# Patient Record
Sex: Female | Born: 1945 | Race: White | Hispanic: No | Marital: Married | Smoking: Never smoker
Health system: Southern US, Community
[De-identification: ages and names within clinical notes are randomized; demographics above are authoritative.]

## PROBLEM LIST (undated history)

## (undated) DIAGNOSIS — G4733 Obstructive sleep apnea (adult) (pediatric): Secondary | ICD-10-CM

## (undated) DIAGNOSIS — I10 Essential (primary) hypertension: Secondary | ICD-10-CM

## (undated) DIAGNOSIS — K5190 Ulcerative colitis, unspecified, without complications: Secondary | ICD-10-CM

## (undated) DIAGNOSIS — E611 Iron deficiency: Secondary | ICD-10-CM

## (undated) DIAGNOSIS — Z794 Long term (current) use of insulin: Secondary | ICD-10-CM

## (undated) DIAGNOSIS — G629 Polyneuropathy, unspecified: Secondary | ICD-10-CM

## (undated) DIAGNOSIS — Z7409 Other reduced mobility: Secondary | ICD-10-CM

## (undated) DIAGNOSIS — K7460 Unspecified cirrhosis of liver: Secondary | ICD-10-CM

## (undated) DIAGNOSIS — K3189 Other diseases of stomach and duodenum: Secondary | ICD-10-CM

## (undated) DIAGNOSIS — IMO0001 Reserved for inherently not codable concepts without codable children: Secondary | ICD-10-CM

## (undated) DIAGNOSIS — K766 Portal hypertension: Secondary | ICD-10-CM

## (undated) DIAGNOSIS — R112 Nausea with vomiting, unspecified: Secondary | ICD-10-CM

## (undated) DIAGNOSIS — F411 Generalized anxiety disorder: Secondary | ICD-10-CM

## (undated) DIAGNOSIS — K219 Gastro-esophageal reflux disease without esophagitis: Secondary | ICD-10-CM

## (undated) DIAGNOSIS — K589 Irritable bowel syndrome without diarrhea: Secondary | ICD-10-CM

## (undated) DIAGNOSIS — Z862 Personal history of diseases of the blood and blood-forming organs and certain disorders involving the immune mechanism: Secondary | ICD-10-CM

## (undated) DIAGNOSIS — Z8719 Personal history of other diseases of the digestive system: Secondary | ICD-10-CM

## (undated) DIAGNOSIS — T8859XA Other complications of anesthesia, initial encounter: Secondary | ICD-10-CM

## (undated) DIAGNOSIS — I480 Paroxysmal atrial fibrillation: Secondary | ICD-10-CM

## (undated) DIAGNOSIS — I499 Cardiac arrhythmia, unspecified: Secondary | ICD-10-CM

## (undated) DIAGNOSIS — E785 Hyperlipidemia, unspecified: Secondary | ICD-10-CM

## (undated) DIAGNOSIS — R040 Epistaxis: Secondary | ICD-10-CM

## (undated) DIAGNOSIS — K7581 Nonalcoholic steatohepatitis (NASH): Secondary | ICD-10-CM

## (undated) DIAGNOSIS — Z9889 Other specified postprocedural states: Secondary | ICD-10-CM

## (undated) DIAGNOSIS — G5601 Carpal tunnel syndrome, right upper limb: Secondary | ICD-10-CM

## (undated) DIAGNOSIS — E119 Type 2 diabetes mellitus without complications: Secondary | ICD-10-CM

## (undated) DIAGNOSIS — I5032 Chronic diastolic (congestive) heart failure: Secondary | ICD-10-CM

## (undated) DIAGNOSIS — M797 Fibromyalgia: Secondary | ICD-10-CM

## (undated) DIAGNOSIS — M1990 Unspecified osteoarthritis, unspecified site: Secondary | ICD-10-CM

## (undated) DIAGNOSIS — F419 Anxiety disorder, unspecified: Secondary | ICD-10-CM

## (undated) HISTORY — PX: BREAST SURGERY: SHX581

## (undated) HISTORY — DX: Other diseases of stomach and duodenum: K76.6

## (undated) HISTORY — DX: Anxiety disorder, unspecified: F41.9

## (undated) HISTORY — PX: KNEE ARTHROSCOPY: SHX127

## (undated) HISTORY — DX: Generalized anxiety disorder: F41.1

## (undated) HISTORY — PX: LEFT ATRIAL APPENDAGE OCCLUSION: SHX173A

## (undated) HISTORY — DX: Essential (primary) hypertension: I10

## (undated) HISTORY — DX: Morbid (severe) obesity due to excess calories: E66.01

## (undated) HISTORY — DX: Gastro-esophageal reflux disease without esophagitis: K21.9

## (undated) HISTORY — DX: Polyneuropathy, unspecified: G62.9

## (undated) HISTORY — DX: Epistaxis: R04.0

## (undated) HISTORY — DX: Obstructive sleep apnea (adult) (pediatric): G47.33

## (undated) HISTORY — DX: Other reduced mobility: Z74.09

## (undated) HISTORY — PX: CHOLECYSTECTOMY: SHX55

## (undated) HISTORY — DX: Fibromyalgia: M79.7

## (undated) HISTORY — PX: RECTOCELE REPAIR: SHX761

## (undated) HISTORY — DX: Hyperlipidemia, unspecified: E78.5

## (undated) HISTORY — DX: Nonalcoholic steatohepatitis (NASH): K75.81

## (undated) HISTORY — PX: ESOPHAGOGASTRODUODENOSCOPY: SHX1529

## (undated) HISTORY — DX: Paroxysmal atrial fibrillation: I48.0

## (undated) HISTORY — DX: Unspecified cirrhosis of liver: K74.60

## (undated) HISTORY — PX: BACK SURGERY: SHX140

## (undated) HISTORY — DX: Chronic diastolic (congestive) heart failure: I50.32

## (undated) HISTORY — DX: Irritable bowel syndrome, unspecified: K58.9

## (undated) HISTORY — DX: Iron deficiency: E61.1

## (undated) HISTORY — DX: Other diseases of stomach and duodenum: K31.89

---

## 1978-09-30 HISTORY — PX: ABDOMINAL HYSTERECTOMY: SHX81

## 1992-09-30 HISTORY — PX: BREAST REDUCTION SURGERY: SHX8

## 1994-09-30 HISTORY — PX: CARPAL TUNNEL RELEASE: SHX101

## 1998-10-26 ENCOUNTER — Encounter: Payer: Self-pay | Admitting: Neurosurgery

## 1998-11-09 ENCOUNTER — Encounter: Payer: Self-pay | Admitting: Neurosurgery

## 1998-11-09 ENCOUNTER — Ambulatory Visit (HOSPITAL_COMMUNITY): Admission: RE | Admit: 1998-11-09 | Discharge: 1998-11-09 | Payer: Self-pay | Admitting: Neurosurgery

## 1998-11-23 ENCOUNTER — Ambulatory Visit (HOSPITAL_COMMUNITY): Admission: RE | Admit: 1998-11-23 | Discharge: 1998-11-23 | Payer: Self-pay | Admitting: Neurosurgery

## 1998-11-23 ENCOUNTER — Encounter: Payer: Self-pay | Admitting: Neurosurgery

## 1999-02-27 ENCOUNTER — Encounter: Admission: RE | Admit: 1999-02-27 | Discharge: 1999-03-30 | Payer: Self-pay | Admitting: Orthopedic Surgery

## 1999-06-12 ENCOUNTER — Encounter: Payer: Self-pay | Admitting: Neurosurgery

## 1999-06-12 ENCOUNTER — Ambulatory Visit (HOSPITAL_COMMUNITY): Admission: RE | Admit: 1999-06-12 | Discharge: 1999-06-12 | Payer: Self-pay | Admitting: Neurosurgery

## 1999-09-05 ENCOUNTER — Encounter: Payer: Self-pay | Admitting: Neurosurgery

## 1999-09-07 ENCOUNTER — Inpatient Hospital Stay (HOSPITAL_COMMUNITY): Admission: RE | Admit: 1999-09-07 | Discharge: 1999-09-09 | Payer: Self-pay | Admitting: Neurosurgery

## 1999-09-07 ENCOUNTER — Encounter: Payer: Self-pay | Admitting: Neurosurgery

## 1999-09-07 HISTORY — PX: HEMILAMINOTOMY LUMBAR SPINE: SUR654

## 1999-10-10 ENCOUNTER — Encounter: Admission: RE | Admit: 1999-10-10 | Discharge: 2000-01-08 | Payer: Self-pay | Admitting: Neurosurgery

## 2000-01-16 ENCOUNTER — Encounter: Admission: RE | Admit: 2000-01-16 | Discharge: 2000-01-16 | Payer: Self-pay | Admitting: Neurosurgery

## 2000-06-16 ENCOUNTER — Encounter: Admission: RE | Admit: 2000-06-16 | Discharge: 2000-06-16 | Payer: Self-pay | Admitting: Orthopedic Surgery

## 2000-06-16 ENCOUNTER — Encounter: Payer: Self-pay | Admitting: Orthopedic Surgery

## 2000-09-30 HISTORY — PX: TARSAL TUNNEL RELEASE: SUR1099

## 2001-01-20 ENCOUNTER — Encounter (INDEPENDENT_AMBULATORY_CARE_PROVIDER_SITE_OTHER): Payer: Self-pay | Admitting: Internal Medicine

## 2001-01-20 ENCOUNTER — Ambulatory Visit (HOSPITAL_COMMUNITY): Admission: RE | Admit: 2001-01-20 | Discharge: 2001-01-20 | Payer: Self-pay | Admitting: Internal Medicine

## 2001-01-27 ENCOUNTER — Other Ambulatory Visit: Admission: RE | Admit: 2001-01-27 | Discharge: 2001-01-27 | Payer: Self-pay | Admitting: Obstetrics and Gynecology

## 2001-02-02 ENCOUNTER — Encounter: Payer: Self-pay | Admitting: Obstetrics and Gynecology

## 2001-02-02 ENCOUNTER — Ambulatory Visit (HOSPITAL_COMMUNITY): Admission: RE | Admit: 2001-02-02 | Discharge: 2001-02-02 | Payer: Self-pay | Admitting: Obstetrics and Gynecology

## 2001-02-10 ENCOUNTER — Inpatient Hospital Stay (HOSPITAL_COMMUNITY): Admission: RE | Admit: 2001-02-10 | Discharge: 2001-02-12 | Payer: Self-pay | Admitting: Obstetrics and Gynecology

## 2001-02-10 ENCOUNTER — Encounter (INDEPENDENT_AMBULATORY_CARE_PROVIDER_SITE_OTHER): Payer: Self-pay

## 2001-02-10 HISTORY — PX: LYSIS OF ADHESION: SHX5961

## 2001-02-10 HISTORY — PX: URETEROLYSIS: SUR1407

## 2001-02-10 HISTORY — PX: BILATERAL SALPINGOOPHORECTOMY: SHX1223

## 2001-04-29 ENCOUNTER — Encounter: Admission: RE | Admit: 2001-04-29 | Discharge: 2001-07-28 | Payer: Self-pay | Admitting: Sports Medicine

## 2002-03-22 ENCOUNTER — Encounter: Payer: Self-pay | Admitting: Obstetrics and Gynecology

## 2002-03-22 ENCOUNTER — Ambulatory Visit (HOSPITAL_COMMUNITY): Admission: RE | Admit: 2002-03-22 | Discharge: 2002-03-22 | Payer: Self-pay | Admitting: Obstetrics and Gynecology

## 2002-09-29 ENCOUNTER — Ambulatory Visit (HOSPITAL_COMMUNITY): Admission: RE | Admit: 2002-09-29 | Discharge: 2002-09-29 | Payer: Self-pay | Admitting: Internal Medicine

## 2003-03-21 ENCOUNTER — Encounter (INDEPENDENT_AMBULATORY_CARE_PROVIDER_SITE_OTHER): Payer: Self-pay | Admitting: *Deleted

## 2003-03-21 ENCOUNTER — Ambulatory Visit (HOSPITAL_BASED_OUTPATIENT_CLINIC_OR_DEPARTMENT_OTHER): Admission: RE | Admit: 2003-03-21 | Discharge: 2003-03-21 | Payer: Self-pay | Admitting: Orthopedic Surgery

## 2003-03-21 HISTORY — PX: TUMOR EXCISION: SHX421

## 2003-03-21 HISTORY — PX: CARPAL TUNNEL RELEASE: SHX101

## 2003-03-29 ENCOUNTER — Ambulatory Visit (HOSPITAL_COMMUNITY): Admission: RE | Admit: 2003-03-29 | Discharge: 2003-03-29 | Payer: Self-pay | Admitting: Obstetrics and Gynecology

## 2003-03-29 ENCOUNTER — Encounter: Payer: Self-pay | Admitting: Obstetrics and Gynecology

## 2003-05-27 ENCOUNTER — Encounter: Payer: Self-pay | Admitting: Neurosurgery

## 2003-05-27 ENCOUNTER — Ambulatory Visit (HOSPITAL_COMMUNITY): Admission: RE | Admit: 2003-05-27 | Discharge: 2003-05-27 | Payer: Self-pay | Admitting: Neurosurgery

## 2003-06-01 ENCOUNTER — Ambulatory Visit (HOSPITAL_COMMUNITY): Admission: RE | Admit: 2003-06-01 | Discharge: 2003-06-01 | Payer: Self-pay | Admitting: Internal Medicine

## 2003-06-01 ENCOUNTER — Encounter (INDEPENDENT_AMBULATORY_CARE_PROVIDER_SITE_OTHER): Payer: Self-pay | Admitting: Internal Medicine

## 2003-11-01 ENCOUNTER — Encounter
Admission: RE | Admit: 2003-11-01 | Discharge: 2004-01-30 | Payer: Self-pay | Admitting: Physical Medicine and Rehabilitation

## 2004-03-06 ENCOUNTER — Encounter
Admission: RE | Admit: 2004-03-06 | Discharge: 2004-06-04 | Payer: Self-pay | Admitting: Physical Medicine and Rehabilitation

## 2004-04-03 ENCOUNTER — Ambulatory Visit (HOSPITAL_COMMUNITY): Admission: RE | Admit: 2004-04-03 | Discharge: 2004-04-03 | Payer: Self-pay | Admitting: Obstetrics and Gynecology

## 2004-04-10 ENCOUNTER — Ambulatory Visit (HOSPITAL_COMMUNITY): Admission: RE | Admit: 2004-04-10 | Discharge: 2004-04-10 | Payer: Self-pay | Admitting: Internal Medicine

## 2004-06-26 ENCOUNTER — Encounter
Admission: RE | Admit: 2004-06-26 | Discharge: 2004-09-10 | Payer: Self-pay | Admitting: Physical Medicine and Rehabilitation

## 2004-06-27 ENCOUNTER — Ambulatory Visit: Payer: Self-pay | Admitting: Physical Medicine and Rehabilitation

## 2004-08-08 ENCOUNTER — Ambulatory Visit: Payer: Self-pay | Admitting: Family Medicine

## 2004-08-17 ENCOUNTER — Ambulatory Visit: Payer: Self-pay | Admitting: Physical Medicine and Rehabilitation

## 2004-09-06 ENCOUNTER — Ambulatory Visit: Payer: Self-pay | Admitting: Family Medicine

## 2004-09-10 ENCOUNTER — Encounter
Admission: RE | Admit: 2004-09-10 | Discharge: 2004-12-09 | Payer: Self-pay | Admitting: Physical Medicine and Rehabilitation

## 2004-10-17 ENCOUNTER — Ambulatory Visit: Payer: Self-pay | Admitting: Physical Medicine and Rehabilitation

## 2004-10-24 ENCOUNTER — Ambulatory Visit: Payer: Self-pay | Admitting: Family Medicine

## 2004-11-23 ENCOUNTER — Encounter
Admission: RE | Admit: 2004-11-23 | Discharge: 2005-01-23 | Payer: Self-pay | Admitting: Physical Medicine and Rehabilitation

## 2004-12-17 ENCOUNTER — Encounter
Admission: RE | Admit: 2004-12-17 | Discharge: 2005-03-17 | Payer: Self-pay | Admitting: Physical Medicine and Rehabilitation

## 2004-12-19 ENCOUNTER — Ambulatory Visit: Payer: Self-pay | Admitting: Physical Medicine and Rehabilitation

## 2005-01-30 ENCOUNTER — Ambulatory Visit: Payer: Self-pay | Admitting: Family Medicine

## 2005-02-22 ENCOUNTER — Ambulatory Visit: Payer: Self-pay | Admitting: Physical Medicine and Rehabilitation

## 2005-03-15 ENCOUNTER — Encounter
Admission: RE | Admit: 2005-03-15 | Discharge: 2005-04-04 | Payer: Self-pay | Admitting: Physical Medicine and Rehabilitation

## 2005-03-21 ENCOUNTER — Ambulatory Visit: Payer: Self-pay | Admitting: Internal Medicine

## 2005-04-01 ENCOUNTER — Ambulatory Visit: Payer: Self-pay | Admitting: Family Medicine

## 2005-05-01 ENCOUNTER — Ambulatory Visit (HOSPITAL_COMMUNITY): Admission: RE | Admit: 2005-05-01 | Discharge: 2005-05-01 | Payer: Self-pay | Admitting: Specialist

## 2005-05-22 ENCOUNTER — Ambulatory Visit: Payer: Self-pay | Admitting: Family Medicine

## 2005-06-21 ENCOUNTER — Ambulatory Visit: Payer: Self-pay | Admitting: Family Medicine

## 2005-07-19 ENCOUNTER — Ambulatory Visit: Payer: Self-pay | Admitting: Family Medicine

## 2005-07-23 ENCOUNTER — Ambulatory Visit: Payer: Self-pay | Admitting: Physical Medicine and Rehabilitation

## 2005-07-23 ENCOUNTER — Encounter
Admission: RE | Admit: 2005-07-23 | Discharge: 2005-10-21 | Payer: Self-pay | Admitting: Physical Medicine and Rehabilitation

## 2005-10-17 ENCOUNTER — Ambulatory Visit: Payer: Self-pay | Admitting: Family Medicine

## 2005-10-23 ENCOUNTER — Ambulatory Visit: Payer: Self-pay | Admitting: Family Medicine

## 2005-11-07 ENCOUNTER — Ambulatory Visit: Payer: Self-pay | Admitting: Family Medicine

## 2005-11-28 ENCOUNTER — Ambulatory Visit: Payer: Self-pay | Admitting: Internal Medicine

## 2005-12-02 ENCOUNTER — Ambulatory Visit (HOSPITAL_COMMUNITY): Admission: RE | Admit: 2005-12-02 | Discharge: 2005-12-02 | Payer: Self-pay | Admitting: Internal Medicine

## 2005-12-02 ENCOUNTER — Ambulatory Visit: Payer: Self-pay | Admitting: Internal Medicine

## 2005-12-02 HISTORY — PX: ESOPHAGOGASTRODUODENOSCOPY (EGD) WITH ESOPHAGEAL DILATION: SHX5812

## 2005-12-31 ENCOUNTER — Ambulatory Visit: Payer: Self-pay | Admitting: Family Medicine

## 2006-01-21 ENCOUNTER — Ambulatory Visit: Payer: Self-pay | Admitting: Family Medicine

## 2006-03-18 ENCOUNTER — Encounter
Admission: RE | Admit: 2006-03-18 | Discharge: 2006-06-16 | Payer: Self-pay | Admitting: Physical Medicine and Rehabilitation

## 2006-03-18 ENCOUNTER — Ambulatory Visit: Payer: Self-pay | Admitting: Physical Medicine and Rehabilitation

## 2006-04-03 ENCOUNTER — Ambulatory Visit: Payer: Self-pay | Admitting: Family Medicine

## 2006-05-06 ENCOUNTER — Ambulatory Visit (HOSPITAL_COMMUNITY): Admission: RE | Admit: 2006-05-06 | Discharge: 2006-05-06 | Payer: Self-pay | Admitting: Obstetrics & Gynecology

## 2006-07-10 ENCOUNTER — Ambulatory Visit: Payer: Self-pay | Admitting: Family Medicine

## 2006-07-26 ENCOUNTER — Ambulatory Visit: Payer: Self-pay | Admitting: Family Medicine

## 2006-09-12 ENCOUNTER — Ambulatory Visit (HOSPITAL_COMMUNITY): Admission: RE | Admit: 2006-09-12 | Discharge: 2006-09-13 | Payer: Self-pay | Admitting: Urology

## 2006-12-18 ENCOUNTER — Ambulatory Visit: Payer: Self-pay | Admitting: Family Medicine

## 2007-03-26 ENCOUNTER — Ambulatory Visit: Payer: Self-pay | Admitting: Family Medicine

## 2007-05-11 ENCOUNTER — Ambulatory Visit (HOSPITAL_COMMUNITY): Admission: RE | Admit: 2007-05-11 | Discharge: 2007-05-11 | Payer: Self-pay | Admitting: Obstetrics & Gynecology

## 2007-10-01 LAB — HM COLONOSCOPY: HM Colonoscopy: NORMAL

## 2008-06-02 ENCOUNTER — Ambulatory Visit (HOSPITAL_COMMUNITY): Admission: RE | Admit: 2008-06-02 | Discharge: 2008-06-02 | Payer: Self-pay | Admitting: Obstetrics & Gynecology

## 2009-04-30 LAB — CONVERTED CEMR LAB: Pap Smear: NORMAL

## 2009-06-21 ENCOUNTER — Ambulatory Visit (HOSPITAL_COMMUNITY): Admission: RE | Admit: 2009-06-21 | Discharge: 2009-06-21 | Payer: Self-pay | Admitting: Obstetrics & Gynecology

## 2009-06-21 ENCOUNTER — Encounter: Payer: Self-pay | Admitting: Obstetrics & Gynecology

## 2009-06-29 ENCOUNTER — Ambulatory Visit (HOSPITAL_COMMUNITY): Admission: RE | Admit: 2009-06-29 | Discharge: 2009-06-29 | Payer: Self-pay | Admitting: Obstetrics & Gynecology

## 2009-09-05 ENCOUNTER — Encounter: Payer: Self-pay | Admitting: Internal Medicine

## 2009-10-12 ENCOUNTER — Encounter: Payer: Self-pay | Admitting: Endocrinology

## 2009-12-19 ENCOUNTER — Encounter: Payer: Self-pay | Admitting: Internal Medicine

## 2009-12-19 ENCOUNTER — Encounter: Payer: Self-pay | Admitting: Endocrinology

## 2009-12-19 LAB — CONVERTED CEMR LAB
Alkaline Phosphatase: 51 units/L
BUN: 14 mg/dL
CO2: 31 meq/L
Creatinine, Ser: 0.82 mg/dL
Glucose, Bld: 284 mg/dL
HDL: 37 mg/dL
Hgb A1c MFr Bld: 7.7 %
Triglyceride fasting, serum: 413 mg/dL

## 2009-12-26 ENCOUNTER — Encounter: Payer: Self-pay | Admitting: Internal Medicine

## 2009-12-27 ENCOUNTER — Encounter: Payer: Self-pay | Admitting: Internal Medicine

## 2009-12-27 LAB — CONVERTED CEMR LAB
BUN: 12 mg/dL
Basophils Relative: 0 %
CO2: 26 meq/L
Calcium: 9.4 mg/dL
Chloride: 98 meq/L
Creatinine, Ser: 0.95 mg/dL
Glucose, Bld: 214 mg/dL
Monocytes Relative: 10 %
Platelets: 284 10*3/uL
Potassium: 4 meq/L
Sodium: 139 meq/L

## 2010-01-03 ENCOUNTER — Ambulatory Visit (HOSPITAL_COMMUNITY): Admission: RE | Admit: 2010-01-03 | Discharge: 2010-01-03 | Payer: Self-pay | Admitting: Obstetrics & Gynecology

## 2010-01-26 ENCOUNTER — Encounter: Payer: Self-pay | Admitting: Internal Medicine

## 2010-01-26 ENCOUNTER — Encounter: Payer: Self-pay | Admitting: Endocrinology

## 2010-02-07 ENCOUNTER — Encounter: Payer: Self-pay | Admitting: Internal Medicine

## 2010-02-28 ENCOUNTER — Encounter: Payer: Self-pay | Admitting: Internal Medicine

## 2010-02-28 ENCOUNTER — Encounter: Payer: Self-pay | Admitting: Endocrinology

## 2010-03-09 ENCOUNTER — Encounter: Payer: Self-pay | Admitting: Endocrinology

## 2010-03-09 ENCOUNTER — Encounter: Payer: Self-pay | Admitting: Internal Medicine

## 2010-03-23 ENCOUNTER — Encounter: Payer: Self-pay | Admitting: Endocrinology

## 2010-04-12 ENCOUNTER — Ambulatory Visit: Payer: Self-pay | Admitting: Endocrinology

## 2010-04-12 DIAGNOSIS — I10 Essential (primary) hypertension: Secondary | ICD-10-CM | POA: Insufficient documentation

## 2010-04-12 DIAGNOSIS — K219 Gastro-esophageal reflux disease without esophagitis: Secondary | ICD-10-CM

## 2010-04-12 DIAGNOSIS — K5190 Ulcerative colitis, unspecified, without complications: Secondary | ICD-10-CM

## 2010-04-12 DIAGNOSIS — N959 Unspecified menopausal and perimenopausal disorder: Secondary | ICD-10-CM | POA: Insufficient documentation

## 2010-05-29 ENCOUNTER — Ambulatory Visit: Payer: Self-pay | Admitting: Internal Medicine

## 2010-05-29 DIAGNOSIS — Z9889 Other specified postprocedural states: Secondary | ICD-10-CM

## 2010-05-29 DIAGNOSIS — Z9189 Other specified personal risk factors, not elsewhere classified: Secondary | ICD-10-CM | POA: Insufficient documentation

## 2010-05-29 DIAGNOSIS — R079 Chest pain, unspecified: Secondary | ICD-10-CM | POA: Insufficient documentation

## 2010-05-29 LAB — CONVERTED CEMR LAB
BUN: 14 mg/dL (ref 6–23)
Basophils Absolute: 0 10*3/uL (ref 0.0–0.1)
CO2: 31 meq/L (ref 19–32)
Calcium: 9.7 mg/dL (ref 8.4–10.5)
Creatinine,U: 114 mg/dL
Direct LDL: 119.2 mg/dL
Eosinophils Relative: 1.9 % (ref 0.0–5.0)
GFR calc non Af Amer: 69.03 mL/min (ref >60)
Hemoglobin: 14.2 g/dL (ref 12.0–15.0)
Hgb A1c MFr Bld: 7.3 % — ABNORMAL HIGH (ref 4.6–6.5)
Lymphocytes Relative: 49.7 % — ABNORMAL HIGH (ref 12.0–46.0)
Lymphs Abs: 3.1 10*3/uL (ref 0.7–4.0)
MCHC: 35.3 g/dL (ref 30.0–36.0)
MCV: 91.8 fL (ref 78.0–100.0)
Microalb Creat Ratio: 1 mg/g (ref 0.0–30.0)
Microalb, Ur: 1.1 mg/dL (ref 0.0–1.9)
Monocytes Absolute: 0.5 10*3/uL (ref 0.1–1.0)
RDW: 13.1 % (ref 11.5–14.6)
Sodium: 139 meq/L (ref 135–145)
Total CHOL/HDL Ratio: 5

## 2010-06-07 ENCOUNTER — Ambulatory Visit: Payer: Self-pay | Admitting: Internal Medicine

## 2010-06-07 DIAGNOSIS — M25519 Pain in unspecified shoulder: Secondary | ICD-10-CM | POA: Insufficient documentation

## 2010-06-11 ENCOUNTER — Encounter: Payer: Self-pay | Admitting: Internal Medicine

## 2010-06-18 ENCOUNTER — Ambulatory Visit: Payer: Self-pay | Admitting: Cardiology

## 2010-06-18 ENCOUNTER — Ambulatory Visit: Payer: Self-pay

## 2010-06-18 ENCOUNTER — Ambulatory Visit (HOSPITAL_COMMUNITY): Admission: RE | Admit: 2010-06-18 | Discharge: 2010-06-18 | Payer: Self-pay | Admitting: Internal Medicine

## 2010-06-18 ENCOUNTER — Encounter: Payer: Self-pay | Admitting: Internal Medicine

## 2010-07-12 ENCOUNTER — Ambulatory Visit: Payer: Self-pay | Admitting: Endocrinology

## 2010-07-12 ENCOUNTER — Ambulatory Visit: Payer: Self-pay | Admitting: Internal Medicine

## 2010-07-31 HISTORY — PX: CARDIOVASCULAR STRESS TEST: SHX262

## 2010-08-08 ENCOUNTER — Telehealth: Payer: Self-pay | Admitting: Internal Medicine

## 2010-08-08 DIAGNOSIS — R002 Palpitations: Secondary | ICD-10-CM | POA: Insufficient documentation

## 2010-08-10 ENCOUNTER — Ambulatory Visit: Payer: Self-pay | Admitting: Cardiology

## 2010-08-10 ENCOUNTER — Encounter: Payer: Self-pay | Admitting: Cardiology

## 2010-08-10 ENCOUNTER — Telehealth (INDEPENDENT_AMBULATORY_CARE_PROVIDER_SITE_OTHER): Payer: Self-pay | Admitting: *Deleted

## 2010-08-15 ENCOUNTER — Telehealth (INDEPENDENT_AMBULATORY_CARE_PROVIDER_SITE_OTHER): Payer: Self-pay | Admitting: *Deleted

## 2010-08-16 ENCOUNTER — Encounter: Payer: Self-pay | Admitting: Cardiology

## 2010-08-16 ENCOUNTER — Encounter: Payer: Self-pay | Admitting: Cardiovascular Disease

## 2010-08-16 ENCOUNTER — Ambulatory Visit: Payer: Self-pay

## 2010-08-16 ENCOUNTER — Ambulatory Visit: Payer: Self-pay | Admitting: Cardiology

## 2010-08-16 ENCOUNTER — Encounter (HOSPITAL_COMMUNITY)
Admission: RE | Admit: 2010-08-16 | Discharge: 2010-09-29 | Payer: Self-pay | Source: Home / Self Care | Attending: Cardiology | Admitting: Cardiology

## 2010-08-22 ENCOUNTER — Telehealth: Payer: Self-pay | Admitting: Cardiology

## 2010-08-29 ENCOUNTER — Ambulatory Visit (HOSPITAL_COMMUNITY): Admission: RE | Admit: 2010-08-29 | Discharge: 2010-08-29 | Payer: Self-pay | Admitting: Obstetrics & Gynecology

## 2010-09-25 ENCOUNTER — Telehealth: Payer: Self-pay | Admitting: Internal Medicine

## 2010-09-26 ENCOUNTER — Ambulatory Visit
Admission: RE | Admit: 2010-09-26 | Discharge: 2010-09-26 | Payer: Self-pay | Source: Home / Self Care | Attending: Internal Medicine | Admitting: Internal Medicine

## 2010-09-26 DIAGNOSIS — J209 Acute bronchitis, unspecified: Secondary | ICD-10-CM | POA: Insufficient documentation

## 2010-09-27 ENCOUNTER — Encounter: Payer: Self-pay | Admitting: Cardiology

## 2010-09-27 ENCOUNTER — Ambulatory Visit: Payer: Self-pay | Admitting: Cardiology

## 2010-09-28 ENCOUNTER — Telehealth: Payer: Self-pay | Admitting: Internal Medicine

## 2010-10-11 ENCOUNTER — Other Ambulatory Visit: Payer: Self-pay | Admitting: Endocrinology

## 2010-10-11 ENCOUNTER — Ambulatory Visit
Admission: RE | Admit: 2010-10-11 | Discharge: 2010-10-11 | Payer: Self-pay | Source: Home / Self Care | Attending: Internal Medicine | Admitting: Internal Medicine

## 2010-10-11 ENCOUNTER — Ambulatory Visit
Admission: RE | Admit: 2010-10-11 | Discharge: 2010-10-11 | Payer: Self-pay | Source: Home / Self Care | Attending: Endocrinology | Admitting: Endocrinology

## 2010-10-11 DIAGNOSIS — R5381 Other malaise: Secondary | ICD-10-CM | POA: Insufficient documentation

## 2010-10-11 DIAGNOSIS — R5383 Other fatigue: Secondary | ICD-10-CM

## 2010-10-11 LAB — HEMOGLOBIN A1C: Hgb A1c MFr Bld: 7.8 % — ABNORMAL HIGH (ref 4.6–6.5)

## 2010-10-11 LAB — CBC WITH DIFFERENTIAL/PLATELET
Basophils Absolute: 0 10*3/uL (ref 0.0–0.1)
Basophils Relative: 0.4 % (ref 0.0–3.0)
Eosinophils Absolute: 0.1 10*3/uL (ref 0.0–0.7)
Eosinophils Relative: 0.6 % (ref 0.0–5.0)
HCT: 43.4 % (ref 36.0–46.0)
Hemoglobin: 15.1 g/dL — ABNORMAL HIGH (ref 12.0–15.0)
Lymphocytes Relative: 46.3 % — ABNORMAL HIGH (ref 12.0–46.0)
Lymphs Abs: 4.1 10*3/uL — ABNORMAL HIGH (ref 0.7–4.0)
MCHC: 34.8 g/dL (ref 30.0–36.0)
MCV: 95.1 fl (ref 78.0–100.0)
Monocytes Absolute: 0.7 10*3/uL (ref 0.1–1.0)
Monocytes Relative: 7.5 % (ref 3.0–12.0)
Neutro Abs: 4 10*3/uL (ref 1.4–7.7)
Neutrophils Relative %: 45.2 % (ref 43.0–77.0)
Platelets: 234 10*3/uL (ref 150.0–400.0)
RBC: 4.57 Mil/uL (ref 3.87–5.11)
RDW: 12.9 % (ref 11.5–14.6)
WBC: 8.8 10*3/uL (ref 4.5–10.5)

## 2010-10-11 LAB — BASIC METABOLIC PANEL
BUN: 17 mg/dL (ref 6–23)
CO2: 26 mEq/L (ref 19–32)
Calcium: 8.8 mg/dL (ref 8.4–10.5)
Chloride: 95 mEq/L — ABNORMAL LOW (ref 96–112)
Creatinine, Ser: 0.7 mg/dL (ref 0.4–1.2)
GFR: 91.29 mL/min (ref >60.00)
Glucose, Bld: 132 mg/dL — ABNORMAL HIGH (ref 70–99)
Potassium: 3.8 mEq/L (ref 3.5–5.1)
Sodium: 139 mEq/L (ref 135–145)

## 2010-10-11 LAB — HEPATIC FUNCTION PANEL
ALT: 27 U/L (ref 0–35)
AST: 27 U/L (ref 0–37)
Albumin: 4 g/dL (ref 3.5–5.2)
Alkaline Phosphatase: 41 U/L (ref 39–117)
Bilirubin, Direct: 0.1 mg/dL (ref 0.0–0.3)
Total Bilirubin: 0.8 mg/dL (ref 0.3–1.2)
Total Protein: 7.5 g/dL (ref 6.0–8.3)

## 2010-10-11 LAB — TSH: TSH: 1.12 u[IU]/mL (ref 0.35–5.50)

## 2010-10-17 ENCOUNTER — Telehealth: Payer: Self-pay | Admitting: Endocrinology

## 2010-10-18 ENCOUNTER — Telehealth: Payer: Self-pay | Admitting: Endocrinology

## 2010-10-20 ENCOUNTER — Encounter: Payer: Self-pay | Admitting: Obstetrics & Gynecology

## 2010-10-21 ENCOUNTER — Encounter: Payer: Self-pay | Admitting: Obstetrics and Gynecology

## 2010-10-21 ENCOUNTER — Encounter (INDEPENDENT_AMBULATORY_CARE_PROVIDER_SITE_OTHER): Payer: Self-pay | Admitting: Internal Medicine

## 2010-10-21 ENCOUNTER — Encounter: Payer: Self-pay | Admitting: *Deleted

## 2010-10-22 ENCOUNTER — Telehealth: Payer: Self-pay | Admitting: Internal Medicine

## 2010-10-28 LAB — CONVERTED CEMR LAB
ALT: 23 units/L
Alkaline Phosphatase: 37 units/L
BUN: 11 mg/dL
Basophils Relative: 1 %
CO2: 29 meq/L
Calcium: 8.8 mg/dL
Calcium: 9.1 mg/dL
Calcium: 9.2 mg/dL
Cholesterol: 175 mg/dL
Creatinine, Ser: 0.85 mg/dL
Creatinine, Ser: 0.86 mg/dL
Eosinophils Relative: 6 %
HCT: 38.9 %
LDL Cholesterol: 80 mg/dL
Lymphocytes, automated: 37 %
Monocytes Relative: 9 %
Neutrophils Relative %: 49 %
Platelets: 244 10*3/uL
RBC: 4.19 M/uL
RDW: 14.2 %
Sodium: 138 meq/L
Sodium: 138 meq/L
Total Protein: 7.1 g/dL
WBC: 6.9 10*3/uL

## 2010-10-30 NOTE — Assessment & Plan Note (Signed)
Summary: new / medicare / cd   Vital Signs:  Patient profile:   65 year old female Height:      61 inches (154.94 cm) Weight:      191.0 pounds (86.82 kg) O2 Sat:      95 % on Room air Temp:     97.7 degrees F (36.50 degrees C) oral Pulse rate:   70 / minute BP sitting:   124 / 78  (left arm) Cuff size:   large  Vitals Entered By: Tomma Lightning RMA (July 12, 2010 10:43 AM)  O2 Flow:  Room air CC: 4 month follow-up Is Patient Diabetic? Yes Did you bring your meter with you today? No Pain Assessment Patient in pain? no      Comments flu shot   Primary Care Herminia Warren:  Rowe Clack MD  CC:  4 month follow-up.  History of Present Illness: here for f/u:  1) DM2 - follows with endo for same - dx 2010 - no known chronic complications.  she has never been on insulin.  she was unable to tolerate metformin (diarrhea), januvia (headache), byetta (abdominal bruising), and actos (edema).  she now takes onglyza, metformin and bromocrip; off glipizide. states cbg's are well-controlled. pt says her diet and exercise are good.  2) HTN - reports compliance with ongoing medical treatment and no changes in medication dose or frequency. denies adverse side effects related to current therapy. would prefer to have generics for cost control if possible - chronic LE edema  3) GERD - reports compliance with ongoing medical treatment and no changes in medication dose or frequency. denies adverse side effects related to current therapy.  planning to try baking soda with omeprazole for cost savings in place of zegrid  4) Ulcerative colitis -quiet symptoms at this time - follwed with GI for same - reports compliance with ongoing medical treatment and no changes in medication dose or frequency. denies adverse side effects related to current therapy.   5) CP - cont ongoing occ "fluttering", usually at night when still 2 episodes since completion of stress test 06/19/10 (normal) always occurs at rest  - nonexertional, not positional 1st spell lasted minutes then 2nd episode lasted nearly 1 hour - no pain in past 10days - no pain now a/w palp and racing symptoms dizzy and light head - but no syncope denies radiation pain or n/v -   6) R shoulder pain - only mild and improved since onset early 05/2010 (precipitated by fall) improved sleep with use of robaxin - ?ok to cont same as needed   Clinical Review Panels:  Lipid Management   Cholesterol:  200 (05/29/2010)   LDL (bad choesterol):  80 (01/26/2010)   HDL (good cholesterol):  41.60 (05/29/2010)   Triglycerides:  196 (01/26/2010)  Diabetes Management   HgBA1C:  7.3 (05/29/2010)   Creatinine:  0.9 (05/29/2010)   Last Flu Vaccine:  Fluvax 3+ (07/12/2010)  CBC   WBC:  6.3 (05/29/2010)   RBC:  4.39 (05/29/2010)   Hgb:  14.2 (05/29/2010)   Hct:  40.3 (05/29/2010)   Platelets:  198.0 (05/29/2010)   MCV  91.8 (05/29/2010)   MCHC  35.3 (05/29/2010)   RDW  13.1 (05/29/2010)   PMN:  39.6 (05/29/2010)   Lymphs:  49.7 (05/29/2010)   Monos:  8.4 (05/29/2010)   Eosinophils:  1.9 (05/29/2010)   Basophil:  0.4 (05/29/2010)  Complete Metabolic Panel   Glucose:  153 (05/29/2010)   Sodium:  139 (05/29/2010)  Potassium:  4.9 (05/29/2010)   Chloride:  96 (05/29/2010)   CO2:  31 (05/29/2010)   BUN:  14 (05/29/2010)   Creatinine:  0.9 (05/29/2010)   Albumin:  3.9 (03/09/2010)   Total Protein:  7.1 (03/09/2010)   Calcium:  9.7 (05/29/2010)   Total Bili:  0.5 (03/09/2010)   Alk Phos:  37 (03/09/2010)   SGPT (ALT):  23 (03/09/2010)   SGOT (AST):  29 (03/09/2010)   Current Medications (verified): 1)  Bystolic 10 Mg Tabs (Nebivolol Hcl) .Marland Kitchen.. 1 Tablet Daily With 5 Mg Tab 2)  Bystolic 5 Mg Tabs (Nebivolol Hcl) .Marland Kitchen.. 1 Once Daily With 31m Tab 3)  Onglyza 5 Mg Tabs (Saxagliptin Hcl) ..Marland Kitchen. 1 Tablet Daily 4)  Hydrochlorothiazide 25 Mg Tabs (Hydrochlorothiazide) ..Marland Kitchen. 1 Tablet Daily 5)  Estradiol 1 Mg Tabs (Estradiol) ..Marland Kitchen. 1 Tablet  Daily 6)  Fish Oil 1000 Mg Caps (Omega-3 Fatty Acids) ..Marland Kitchen. 1 Capsule Once Daily 7)  Multivitamins  Caps (Multiple Vitamin) ..Marland Kitchen. 1 Capsule Daily 8)  Pentasa 500 Mg Cr-Caps (Mesalamine) ..Marland Kitchen. 1 Tab By Mouth Two Times A Day 9)  Bromocriptine Mesylate 2.5 Mg Tabs (Bromocriptine Mesylate) ..Marland Kitchen. 1 Tab At Bedtime 10)  Vitamin D 1000 Unit Tabs (Cholecalciferol) .... Take 1 By Mouth Once Daily 11)  Chromium Picolinate 200 Mcg Tabs (Chromium Picolinate) .... Take 1 Po Once Daily 12)  Alprazolam 0.25 Mg Tabs (Alprazolam) .... Take 1 Three Times A Day As Needed 13)  Zegerid 40-1100 Mg Caps (Omeprazole-Sodium Bicarbonate) ..Marland Kitchen. 1 By Mouth Once Daily 14)  Metformin Hcl 500 Mg Xr24h-Tab (Metformin Hcl) .... 2 Tabs Each Am  Allergies (verified): 1)  ! Novocain 2)  ! Sular (Nisoldipine) 3)  ! Percocet  Past History:  Past Medical History: diabetes mellitus type 2 ulcerative colitis  hypertension GERD palpitations - norm stress echo 05/2010  Md roster: [facility]o - [facility]  Review of Systems  The patient denies fever, weight loss, syncope, dyspnea on exertion, and peripheral edema.    Physical Exam  General:  alert, well-developed, well-nourished, and cooperative to examination.  overweight-appearing. spouse at side  Lungs:  normal respiratory effort, no intercostal retractions or use of accessory muscles; normal breath sounds bilaterally - no crackles and no wheezes.    Heart:  normal rate, regular rhythm, no murmur, and no rub. BLE with trace edema. Msk:  right shoulder: Full range of motion with good strength testing rotator cuff. Negative impingement signs. Nontender to palpation. Neurovascularly intact    Impression & Recommendations:  Problem # 1:  HYPERTENSION (ICD-401.9)  stress echo 05/2010 - no ischemic changes - change bystolic to generic toprol xl - potenital risks and benefits discussed with pt including poss SE - pt agrees recheck 3 mo - sooner if probelms - also consider  ARB or ACEI given DM (unless poor tol of med in prev trial)  The following medications were removed from the medication list:    Bystolic 10 Mg Tabs (Nebivolol hcl) ..Marland Kitchen.. 1 tablet daily with 5 mg tab Her updated medication list for this problem includes:    Metoprolol Succinate 100 Mg Xr24h-tab (Metoprolol succinate) ..Marland Kitchen.. 1 by mouth once daily    Hydrochlorothiazide 25 Mg Tabs (Hydrochlorothiazide) ..Marland Kitchen.. 1 tablet daily  BP today: 124/78 Prior BP: 132/76 (06/07/2010)  Labs Reviewed: K+: 4.9 (05/29/2010) Creat: : 0.9 (05/29/2010)   Chol: 200 (05/29/2010)   HDL: 41.60 (05/29/2010)   LDL: 80 (01/26/2010)   TG: 325.0 (05/29/2010)  Orders: Prescription Created Electronically [phone]  Problem #  2:  ULCERATIVE COLITIS (ICD-556.9)  follows with GI for same - cont current med mgmt - symptoms stable  Problem # 3:  DIABETES MELLITUS, TYPE II (ICD-250.00)  Her updated medication list for this problem includes:    Onglyza 5 Mg Tabs (Saxagliptin hcl) .Marland Kitchen... 1 tablet daily    Metformin Hcl 500 Mg Xr24h-tab (Metformin hcl) .Marland Kitchen... 2 tabs each am  following with endo for same  Labs Reviewed: Creat: 0.9 (05/29/2010)    Reviewed HgBA1c results: 7.3 (05/29/2010)  7.3 (03/09/2010)  Problem # 4:  GERD (ICD-530.81)  Her updated medication list for this problem includes:    Zegerid 40-1100 Mg Caps (Omeprazole-sodium bicarbonate) .Marland Kitchen... 1 by mouth once daily  Labs Reviewed: Hgb: 14.2 (05/29/2010)   Hct: 40.3 (05/29/2010)  Problem # 5:  SHOULDER PAIN, RIGHT (ICD-719.41)  Her updated medication list for this problem includes:    Methocarbamol 500 Mg Tabs (Methocarbamol) .Marland Kitchen... 1 by mouth three times a day as needed for muscle spasm  suspect RTC strain due to injury 05/2010- xray w/o bony injury ok to cont muscle relaxants as needed - erx done  Complete Medication List: 1)  Metoprolol Succinate 100 Mg Xr24h-tab (Metoprolol succinate) .Marland Kitchen.. 1 by mouth once daily 2)  Onglyza 5 Mg Tabs (Saxagliptin  hcl) .Marland Kitchen.. 1 tablet daily 3)  Hydrochlorothiazide 25 Mg Tabs (Hydrochlorothiazide) .Marland Kitchen.. 1 tablet daily 4)  Estradiol 1 Mg Tabs (Estradiol) .Marland Kitchen.. 1 tablet daily 5)  Fish Oil 1000 Mg Caps (Omega-3 fatty acids) .Marland Kitchen.. 1 capsule once daily 6)  Multivitamins Caps (Multiple vitamin) .Marland Kitchen.. 1 capsule daily 7)  Pentasa 500 Mg Cr-caps (Mesalamine) .Marland Kitchen.. 1 tab by mouth two times a day 8)  Bromocriptine Mesylate 2.5 Mg Tabs (Bromocriptine mesylate) .Marland Kitchen.. 1 tab at bedtime 9)  Vitamin D 1000 Unit Tabs (Cholecalciferol) .... Take 1 by mouth once daily 10)  Chromium Picolinate 200 Mcg Tabs (Chromium picolinate) .... Take 1 po once daily 11)  Alprazolam 0.25 Mg Tabs (Alprazolam) .... Take 1 three times a day as needed 12)  Zegerid 40-1100 Mg Caps (Omeprazole-sodium bicarbonate) .Marland Kitchen.. 1 by mouth once daily 13)  Metformin Hcl 500 Mg Xr24h-tab (Metformin hcl) .... 2 tabs each am 14)  Methocarbamol 500 Mg Tabs (Methocarbamol) .Marland Kitchen.. 1 by mouth three times a day as needed for muscle spasm  Other Orders: Flu Vaccine 1yr + MEDICARE PATIENTS (([id number] Administration Flu vaccine - MCR (([id number] Flu Vaccine Consent Questions     Do you have a history of severe allergic reactions to this vaccine? no    Any prior history of allergic reactions to egg and/or gelatin? no    Do you have a sensitivity to the preservative Thimersol? no    Do you have a past history of Guillan-Barre Syndrome? no    Do you currently have an acute febrile illness? no    Have you ever had a severe reaction to latex? no    Vaccine information given and explained to patient? yes    Are you currently pregnant? no    Lot Number:[id number]   Exp Date:03/30/2011   Site Given  Left Deltoid IMion Flu vaccine - MCR (([id number]  Patient Instructions: 1)  it was good to see you today. 2)  continue robaxin for muscle spasm and pain as needed  3)  stop bystolic and start generic Toprol XL for blood pressure - 4)   your prescriptions have been electronically submitted  to your pharmacy. Please take as directed. Contact our office if you believe you're having  problems with the medication(s). 5)  Please schedule a follow-up appointment in 3 months to recheck bloos pressure, call sooner if problems.  Prescriptions: METOPROLOL SUCCINATE 100 MG XR24H-TAB (METOPROLOL SUCCINATE) 1 by mouth once daily  #30 x 3   Entered and Authorized by:   Rowe Clack MD   Signed by:   Rowe Clack MD on 07/12/2010   Method used:   Electronically to        [facility] [phone]* (retail)       [address]       [place], [place]  [address]       Ph: [phone] or [phone]       Fax: [phone]   RxID:   [id number] METHOCARBAMOL 500 MG TABS (METHOCARBAMOL) 1 by mouth three times a day as needed for muscle spasm  #60 x 3   Entered and Authorized by:   Rowe Clack MD   Signed by:   Rowe Clack MD on 07/12/2010   Method used:   Electronically to        [facility] [phone]* (retail)       [place], [place]  [address]       Ph: [phone] or [phone]       Fax: [phone]   RxID:   [id number]    .lbmedflu

## 2010-10-30 NOTE — Assessment & Plan Note (Signed)
Summary: NP6/PALPS   Referring Quavis Klutz:  nyland Primary Rosebud Koenen:  Rowe Clack MD  CC:  chest pains and sob and dizziness.Marland Kitchen  History of Present Illness: 65 year old female for evaluation of palpitations. Stress echocardiogram performed in September of 2011 was interpreted as normal. There was mention that LV function may not have improved as much as expected and if clinical suspicion high consider alternative imaging. Patient presents complaining of chest pain. This has been intermittent for years. It is in the substernal area and is described as a pressure. It gets worse after eating and bending over. It can last one to 2 days at a time. She does not have exertional chest pain. She also notices recent palpitations. She has sudden onset of her heart " pounding". There is no syncope. This can last anywhere from 15 minutes to 2 hours. Because of the above we were asked to further evaluate.  Current Medications (verified): 1)  Metoprolol Succinate 100 Mg Xr24h-Tab (Metoprolol Succinate) .Marland Kitchen.. 1 By Mouth Once Daily 2)  Onglyza 5 Mg Tabs (Saxagliptin Hcl) .Marland Kitchen.. 1 Tablet Daily 3)  Hydrochlorothiazide 25 Mg Tabs (Hydrochlorothiazide) .Marland Kitchen.. 1 Tablet Daily 4)  Estradiol 1 Mg Tabs (Estradiol) .Marland Kitchen.. 1 Tablet Daily 5)  Fish Oil 1000 Mg Caps (Omega-3 Fatty Acids) .Marland Kitchen.. 1 Capsule Once Daily 6)  Multivitamins  Caps (Multiple Vitamin) .Marland Kitchen.. 1 Capsule Daily 7)  Pentasa 500 Mg Cr-Caps (Mesalamine) .Marland Kitchen.. 1 Tab By Mouth Two Times A Day 8)  Bromocriptine Mesylate 2.5 Mg Tabs (Bromocriptine Mesylate) .Marland Kitchen.. 1 Tab At Bedtime 9)  Vitamin D 1000 Unit Tabs (Cholecalciferol) .... Take 1 By Mouth Once Daily 10)  Chromium Picolinate 200 Mcg Tabs (Chromium Picolinate) .... Take 1 Po Once Daily 11)  Alprazolam 0.25 Mg Tabs (Alprazolam) .... Take 1 Three Times A Day As Needed 12)  Zegerid 40-1100 Mg Caps (Omeprazole-Sodium Bicarbonate) .Marland Kitchen.. 1 By Mouth Once Daily 13)  Metformin Hcl 500 Mg Xr24h-Tab (Metformin Hcl) .... 2 Tabs  Each Am  Allergies: 1)  ! Novocain 2)  ! Sular (Nisoldipine) 3)  ! Percocet  Past History:  Past Medical History: HYPERTENSION MENOPAUSAL DISORDER  GERD  DIABETES MELLITUS, TYPE II  ULCERATIVE COLITIS    Md roster: endo - [facility] -[facility]  Past Surgical History: 1. In 1980, total abdominal hysterectomy with bladder tack. 2. In 1994, bilateral breast reduction due to fibrocystic breast changes. 3. In 1990, rectocele repair. 4. In 1996, right carpal tunnel release. 5. In May 2000, right knee arthroscopy repair. 6. In December 2000, L4-L5 diskectomy,. 7. In February 2002, right knee arthroscopic repair. 8. Cholecystectomy  Family History: Reviewed history from 05/29/2010 and no changes required. Family History of Alcoholism/Addiction Family History of Arthritis Family History High cholesterol Family History Hypertension  dm:  mother  Father with MI mid 53's  Social History: Reviewed history from 05/29/2010 and no changes required. 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 Remaining systems are negative.   Vital Signs:  Patient profile:   65 year old female Height:      61 inches Weight:      191 pounds BMI:     36.22 Pulse rate:   86 / minute Resp:     14  per minute BP sitting:   130 / 80  (left arm)  Vitals Entered By: Burnett Kanaris (August 10, 2010 11:45 AM)  Physical Exam  General:  Well developed/well nourished in NAD Skin warm/dry Patient not depressed No peripheral clubbing Back-normal HEENT-normal/normal eyelids Neck supple/normal carotid upstroke bilaterally; no bruits; no JVD; no  thyromegaly chest - CTA/ normal expansion CV - RRR/normal S1 and S2; no murmurs, rubs or gallops;  PMI nondisplaced Abdomen -NT/ND, no HSM, no mass, + bowel sounds, no bruit 2+ femoral pulses, no bruits Ext-no edema, chords, 2+ DP Neuro-grossly nonfocal     EKG  Procedure date:  08/10/2010  Findings:      Sinus rhythm, prior anterior infarct.  Impression & Recommendations:  Problem # 1:  CHEST PAIN (ICD-786.50) Patient's symptoms are atypical. However she has multiple risk factors. Previous stress echocardiogram suboptimal with possibly less improvement in LV function than would be expected. Plan Myoview for more definitive evaluation. Her updated medication list for this problem includes:    Metoprolol Succinate 100 Mg Xr24h-tab (Metoprolol succinate) .Marland Kitchen... 1 by mouth once daily  Orders: Nuclear Stress Test (Nuc Stress Test)  Problem # 2:  PALPITATIONS, RECURRENT (ICD-785.1) Schedule CardioNet. Her updated medication list for this problem includes:    Metoprolol Succinate 100 Mg Xr24h-tab (Metoprolol succinate) .Marland Kitchen... 1 by mouth once daily  Orders: Event (Event)  Problem # 3:  HYPERTENSION (ICD-401.9) Blood pressure controlled on present medications. Will continue. Her updated medication list for this problem includes:    Metoprolol Succinate 100 Mg Xr24h-tab (Metoprolol succinate) .Marland Kitchen... 1 by mouth once daily    Hydrochlorothiazide 25 Mg Tabs (Hydrochlorothiazide) .Marland Kitchen... 1 tablet daily  Problem # 4:  DIABETES MELLITUS, TYPE II (ICD-250.00)  Her updated medication list for this problem includes:    Onglyza 5 Mg Tabs (Saxagliptin hcl) .Marland Kitchen... 1 tablet daily    Metformin Hcl 500 Mg Xr24h-tab (Metformin hcl) .Marland Kitchen... 2 tabs each am  Problem # 5:  GERD (ICD-530.81)  Her updated medication list for this problem includes:    Zegerid 40-1100 Mg Caps (Omeprazole-sodium bicarbonate) .Marland Kitchen... 1 by mouth once daily  Problem # 6:  ULCERATIVE COLITIS (ICD-556.9)  Patient  Instructions: 1)  Your physician recommends that you schedule a follow-up appointment in: 8 WEEKS 2)  Your physician has recommended that you wear an event monitor.  Event monitors are medical devices that record the heart's electrical activity. Doctors most often use these monitors to diagnose arrhythmias. Arrhythmias are problems with the speed or rhythm of the heartbeat. The monitor is a small, portable device. You can wear one while you do your normal daily activities. This is usually used to diagnose what is causing palpitations/syncope (passing out). 3)  Your physician has requested that you have an [place].  For further information please visit [email or web address].  Please follow instruction sheet, as given.

## 2010-10-30 NOTE — Assessment & Plan Note (Signed)
Summary: 3 mos f/u #/cd   Vital Signs:  Patient profile:   65 year old female Height:      61 inches (154.94 cm) Weight:      191 pounds (86.82 kg) BMI:     36.22 O2 Sat:      95 % on Room air Temp:     97.7 degrees F (36.50 degrees C) oral Pulse rate:   70 / minute BP sitting:   124 / 78  (left arm) Cuff size:   large  Vitals Entered By: Rebeca Alert MA (July 12, 2010 8:51 AM)  O2 Flow:  Room air CC: 3 month F/U/pt is no longer taking Dexilant, Osteo Bi Flex, Meloxicam or Methocarbamol/aj Is Patient Diabetic? Yes   Referring Kirsta Probert:  nyland Primary Leolia Vinzant:  Rowe Clack MD  CC:  3 month F/U/pt is no longer taking Dexilant, Osteo Bi Flex, and Meloxicam or Methocarbamol/aj.  History of Present Illness: pt states she feels well in general.  it varies from 137-300.  it is in general higher later in the day.  she takes metformin, onglyza, and parlodel.    Current Medications (verified): 1)  Bystolic 10 Mg Tabs (Nebivolol Hcl) .Marland Kitchen.. 1 Tablet Daily With 5 Mg Tab 2)  Bystolic 5 Mg Tabs (Nebivolol Hcl) .Marland Kitchen.. 1 Once Daily With 50m Tab 3)  Onglyza 5 Mg Tabs (Saxagliptin Hcl) ..Marland Kitchen. 1 Tablet Daily 4)  Hydrochlorothiazide 25 Mg Tabs (Hydrochlorothiazide) ..Marland Kitchen. 1 Tablet Daily 5)  Dexilant 60 Mg Cpdr (Dexlansoprazole) ..Marland Kitchen. 1 By Mouth Once Daily 6)  Estradiol 1 Mg Tabs (Estradiol) ..Marland Kitchen. 1 Tablet Daily 7)  Osteo Bi-Flex Joint Shield  Tabs (Misc Natural Products) ..Marland Kitchen. 1 Daily 8)  Fish Oil 1000 Mg Caps (Omega-3 Fatty Acids) ..Marland Kitchen. 1 Capsule Once Daily 9)  Multivitamins  Caps (Multiple Vitamin) ..Marland Kitchen. 1 Capsule Daily 10)  Pentasa 500 Mg Cr-Caps (Mesalamine) ..Marland Kitchen. 1 Tab By Mouth Two Times A Day 11)  Bromocriptine Mesylate 2.5 Mg Tabs (Bromocriptine Mesylate) .... 1/2 Tab At Bedtime 12)  Metformin Hcl 500 Mg Tabs (Metformin Hcl) .... Take 1 By Mouth Once Daily 13)  Vitamin D 1000 Unit Tabs (Cholecalciferol) .... Take 1 By Mouth Once Daily 14)  Chromium Picolinate 200 Mcg Tabs (Chromium  Picolinate) .... Take 1 Po Once Daily 15)  Alprazolam 0.25 Mg Tabs (Alprazolam) .... Take 1 Three Times A Day As Needed 16)  Meloxicam 15 Mg Tabs (Meloxicam) ..Marland Kitchen. 1 By Mouth Once Daily X 10days, Then As Needed For Pain 17)  Methocarbamol 500 Mg Tabs (Methocarbamol) ..Marland Kitchen. 1 By Mouth Three Times A Day As Needed For Muscle Spasm Pain 18)  Zegerid 40-1100 Mg Caps (Omeprazole-Sodium Bicarbonate) ..Marland Kitchen. 1 By Mouth Once Daily  Allergies (verified): 1)  ! Novocain 2)  ! Sular (Nisoldipine) 3)  ! Percocet  Past History:  Past Medical History: Last updated: 06/07/2010 diabetes mellitus type 2 ulcerative colitis  hypertension GERD  Md roster: [facility]o - [facility] -[facility]  Review of Systems  The patient denies hypoglycemia.    Physical Exam  General:  obese.  no distress  Extremities:  1+ right pedal edema and 1+ left pedal edema.   Additional Exam:  Hemoglobin A1C   7.7 %   Medications Added to Medication List This Visit: 1)  Bromocriptine Mesylate 2.5 Mg Tabs (Bromocriptine mesylate) ..Marland Kitchen. 1 tab at bedtime 2)  Zegerid 40-1100 Mg Caps (Omeprazole-sodium bicarbonate) ..Marland Kitchen. 1 by mouth once daily 3)  Metformin Hcl 500 Mg Xr24h-tab (Metformin hcl) .... 2 tabs  each am  Other Orders: TLB-A1C / Hgb A1C (Glycohemoglobin) (83036-A1C) Est. Patient Level III (74451)  Patient Instructions: 1)  check your blood sugar 1-2 times a day.  vary the time of day when you check, between before the 3 meals, and at bedtime.  also check if you have symptoms of your blood sugar being too high or too low.  please keep a record of the readings and bring it to your next appointment here.  please call us sooner if you are having low blood sugar episodes. 2)  cc suzanne miller. 3)  blood tests are being ordered for you today.  please call [phone] to hear your test results. 4)  pending the test results, please increase bromocriptine to 2.5 mg at bedtime 5)  Please schedule a follow-up appointment in 3 months. 6)   (update: i left message on phone-tree:  rx as we discussed) Prescriptions: BROMOCRIPTINE MESYLATE 2.5 MG TABS (BROMOCRIPTINE MESYLATE) 1 tab at bedtime  #30 x 11   Entered and Authorized by:   Donavan Foil MD   Signed by:   Donavan Foil MD on 07/12/2010   Method used:   Electronically to        [facility] [phone]* (retail)       [place], [place]  [address]       Ph: [phone] or [phone]       Fax: [phone]   RxID:   [id number] METFORMIN HCL 500 MG XR24H-TAB (METFORMIN HCL) 2 tabs each am  #60 x 11   Entered and Authorized by:   Donavan Foil MD   Signed by:   Donavan Foil MD on 07/12/2010   Method used:   Electronically to        [facility] [phone]* (retail)       [address]       [place], [place]  [address]       Ph: [phone] or [phone]       Fax: [phone]   RxID:   [id number]

## 2010-10-30 NOTE — Progress Notes (Signed)
Summary: re test results  Phone Note Call from Patient   Caller: Patient [phone] Reason for Call: Talk to Nurse, Lab or Test Results Summary of Call: pt calling re test results from last week Initial call taken by: Lorenda Hatchet,  August 22, 2010 9:04 AM  Follow-up for Phone Call        Pt. aware of results Mikle Bosworth RN  August 22, 2010 11:12 AM  Follow-up by: Whitney Jannett Celestine RN,  August 22, 2010 11:11 AM

## 2010-10-30 NOTE — Assessment & Plan Note (Signed)
Summary: PT FELL/ ARM PAIN/NWS #   Vital Signs:  Patient profile:   65 year old female Height:      61 inches (154.94 cm) Weight:      189.9 pounds (86.32 kg) O2 Sat:      98 % on Room air Temp:     98.3 degrees F (36.83 degrees C) oral Pulse rate:   67 / minute BP sitting:   132 / 76  (left arm) Cuff size:   large  Vitals Entered By: Tomma Lightning RMA (June 07, 2010 11:22 AM)  O2 Flow:  Room air CC: (R) arm & shoulder pain Is Patient Diabetic? Yes Did you bring your meter with you today? No Pain Assessment Patient in pain? yes     Location: (R) arm Type: aching Comments Pt states last night she had fell, hurt (R) arm. Been having shoulder and neck pain also   Primary Care Provider:  Rowe Clack MD  CC:  (R) arm & shoulder pain.  History of Present Illness: c/o R shoulder pain - onset pain <24 h ago (last night) precipitated by accidental fall against refridg when lost balance -  after impact against refrig, slid onto floor (hit right shoulder on ground) no popping or brusing - pain at site of impact and along top arc of shoulder cuff slept w/o sig problem last night - pain imporved after taking tylenol and ice to shoulder this AM, pain in shoulder with attempted overhead motion and abduction- chronically "low shoulder" on right compared to left side no HA, no syncope, no numbness no radiation of pain  prior OV reviewed: 1) DM2 - follows with endo for same - dx 2010 - no known chronic complications.  she has never been on insulin.  she was unable to tolerate metformin (diarrhea), januvia (headache), byetta (abdominal bruising), and actos (edema).  she now takes onglyza and glipizide (added 09/2009)  no cbg record, but states cbg's are well-controlled. pt says her diet and exercise are good.  2) HTN - reports compliance with ongoing medical treatment and no changes in medication dose or frequency. denies adverse side effects related to current therapy. would  prefer to have generics for cost control if possible - chronic LE edema  3) GERD - reports compliance with ongoing medical treatment and no changes in medication dose or frequency. denies adverse side effects related to current therapy.   4) Ulcerative colitis -quiet symptoms at this time - follwed with GI for same - reports compliance with ongoing medical treatment and no changes in medication dose or frequency. denies adverse side effects related to current therapy.    CP, no recurrence past 3 weeks - stress echo planned 06/18/10  Current Medications (verified): 1)  Bystolic 10 Mg Tabs (Nebivolol Hcl) .Marland Kitchen.. 1 Tablet Daily With 5 Mg Tab 2)  Bystolic 5 Mg Tabs (Nebivolol Hcl) .Marland Kitchen.. 1 Once Daily With 79m Tab 3)  Onglyza 5 Mg Tabs (Saxagliptin Hcl) ..Marland Kitchen. 1 Tablet Daily 4)  Hydrochlorothiazide 25 Mg Tabs (Hydrochlorothiazide) ..Marland Kitchen. 1 Tablet Daily 5)  Dexilant 60 Mg Cpdr (Dexlansoprazole) ..Marland Kitchen. 1 By Mouth Once Daily 6)  Estradiol 1 Mg Tabs (Estradiol) ..Marland Kitchen. 1 Tablet Daily 7)  Osteo Bi-Flex Joint Shield  Tabs (Misc Natural Products) ..Marland Kitchen. 1 Daily 8)  Fish Oil 1000 Mg Caps (Omega-3 Fatty Acids) ..Marland Kitchen. 1 Capsule Once Daily 9)  Multivitamins  Caps (Multiple Vitamin) ..Marland Kitchen. 1 Capsule Daily 10)  Pentasa 500 Mg Cr-Caps (Mesalamine) ..Marland Kitchen. 1 Tab By Mouth  Two Times A Day 11)  Bromocriptine Mesylate 2.5 Mg Tabs (Bromocriptine Mesylate) .... 1/2 Tab At Bedtime 12)  Metformin Hcl 500 Mg Tabs (Metformin Hcl) .... Take 1 By Mouth Once Daily 13)  Vitamin D 1000 Unit Tabs (Cholecalciferol) .... Take 1 By Mouth Once Daily 14)  Chromium Picolinate 200 Mcg Tabs (Chromium Picolinate) .... Take 1 Po Once Daily 15)  Alprazolam 0.25 Mg Tabs (Alprazolam) .... Take 1 Three Times A Day As Needed  Allergies (verified): 1)  ! Novocain 2)  ! Sular (Nisoldipine) 3)  ! Percocet  Past History:  Past Medical History: diabetes mellitus type 2 ulcerative colitis  hypertension GERD  Md roster: endo - [facility]  Review  of Systems  The patient denies vision loss, chest pain, syncope, headaches, and difficulty walking.    Physical Exam  General:  alert, well-developed, well-nourished, and cooperative to examination.  overweight-appearing. spouse at side  Msk:  right shoulder: Neurovascularly intact - decreased range of motion on forward flexion, abduction, and internal rotation. Positive impingement signs. Decreased strength due to pain with stressing of rotator cuff. referred pain into distal deltoid. Tender over a.c. joint and subacromial.  Skin:  no bruising over right shoulder/arm   Impression & Recommendations:  Problem # 1:  SHOULDER PAIN, RIGHT (ICD-719.41) suspect RTC strain based on hx and exam -  check xray r/o hum head fx - tx conservative antiinflam, muscle relaxants + ice - erx done if cont pain 2 weeks, refer PT or consider ortho if prolonged pain symptoms or worsening despite tx same explained to pt/spouse who understand and agree Her updated medication list for this problem includes:    Meloxicam 15 Mg Tabs (Meloxicam) .Marland Kitchen... 1 by mouth once daily x 10days, then as needed for pain    Methocarbamol 500 Mg Tabs (Methocarbamol) .Marland Kitchen... 1 by mouth three times a day as needed for muscle spasm pain  Orders: T-Shoulder Right ([id number]) Prescription Created Electronically [phone])  Complete Medication List: 1)  Bystolic 10 Mg Tabs (Nebivolol hcl) .Marland Kitchen.. 1 tablet daily with 5 mg tab 2)  Bystolic 5 Mg Tabs (Nebivolol hcl) .Marland Kitchen.. 1 once daily with 67m tab 3)  Onglyza 5 Mg Tabs (Saxagliptin hcl) ..Marland Kitchen. 1 tablet daily 4)  Hydrochlorothiazide 25 Mg Tabs (Hydrochlorothiazide) ..Marland Kitchen. 1 tablet daily 5)  Dexilant 60 Mg Cpdr (Dexlansoprazole) ..Marland Kitchen. 1 by mouth once daily 6)  Estradiol 1 Mg Tabs (Estradiol) ..Marland Kitchen. 1 tablet daily 7)  Osteo Bi-flex Joint Shield Tabs (Misc natural products) ..Marland Kitchen. 1 daily 8)  Fish Oil 1000 Mg Caps (Omega-3 fatty acids) ..Marland Kitchen. 1 capsule once daily 9)  Multivitamins Caps (Multiple vitamin)  ..Marland Kitchen. 1 capsule daily 10)  Pentasa 500 Mg Cr-caps (Mesalamine) ..Marland Kitchen. 1 tab by mouth two times a day 11)  Bromocriptine Mesylate 2.5 Mg Tabs (Bromocriptine mesylate) .... 1/2 tab at bedtime 12)  Metformin Hcl 500 Mg Tabs (Metformin hcl) .... Take 1 by mouth once daily 13)  Vitamin D 1000 Unit Tabs (Cholecalciferol) .... Take 1 by mouth once daily 14)  Chromium Picolinate 200 Mcg Tabs (Chromium picolinate) .... Take 1 po once daily 15)  Alprazolam 0.25 Mg Tabs (Alprazolam) .... Take 1 three times a day as needed 16)  Meloxicam 15 Mg Tabs (Meloxicam) ..Marland Kitchen. 1 by mouth once daily x 10days, then as needed for pain 17)  Methocarbamol 500 Mg Tabs (Methocarbamol) ..Marland Kitchen. 1 by mouth three times a day as needed for muscle spasm pain  Patient Instructions: 1)  it was good to see  you today. 2)  xray right shoulder today - your results will be called to you review in 24-48 hours from the time of test completion 3)  meloxicam for antiinflammatory and robaxin for muscle spasm pain - your prescriptions have been electronically submitted to your pharmacy. Please take as directed. Contact our office if you believe you're having problems with the medication(s). 4)  also ok to use tylenol as needed  5)  continue to ice shoulder 3x/d 20 minutes each time for next 72hours, then as needed  6)  activity as tolerated -  7)  if continued pain with movement in 10-14days, call and we will refer to physical therapy Prescriptions: METHOCARBAMOL 500 MG TABS (METHOCARBAMOL) 1 by mouth three times a day as needed for muscle spasm pain  #40 x 1   Entered and Authorized by:   Rowe Clack MD   Signed by:   Rowe Clack MD on 06/07/2010   Method used:   Electronically to        [facility] [phone]* (retail)       [place], [place]  [address]       Ph: [phone] or [phone]       Fax: [phone]   RxID:   [id number] MELOXICAM 15 MG TABS (MELOXICAM) 1 by  mouth once daily x 10days, then as needed for pain  #30 x 1   Entered and Authorized by:   Rowe Clack MD   Signed by:   Rowe Clack MD on 06/07/2010   Method used:   Electronically to        [facility] [phone]* (retail)       [address]       [place], [place]  [address]       Ph: [phone] or [phone]       Fax: [phone]   RxID:   [id number]

## 2010-10-30 NOTE — Assessment & Plan Note (Signed)
Summary: new endo/diabetes/nyland/medicare,bcbs/#/cd   Vital Signs:  Patient profile:   65 year old female Height:      61 inches (154.94 cm) Weight:      190.13 pounds (86.42 kg) BMI:     36.05 O2 Sat:      96 % on Room air Temp:     97.2 degrees F (36.22 degrees C) oral Pulse rate:   60 / minute BP sitting:   130 / 82  (left arm) Cuff size:   large  Vitals Entered By: Rebeca Alert MA (April 12, 2010 10:17 AM)  O2 Flow:  Room air CC: New Endo Pt/Diabetes/aj Is Patient Diabetic? Yes   Referring Provider:  nyland  CC:  New Endo Pt/Diabetes/aj.  History of Present Illness: pt states 1 year h/o dm.  she is unaware of any chronic complications.  she has never been on insulin.  she was unable to tolerate metformin (diarrhea), januvia (headache), byetta (abdominal bruising), and actos (edema).  she now takes onglyza and glipizide.  the glipizide was added approx 6 mos ago.  no cbg record, but states cbg's are well-controlled. pt says her diet and exercise are good. symptomatically, pt states 6 mos of mild leg cramps, and associated numbness.  Current Medications (verified): 1)  Bystolic 10 Mg Tabs (Nebivolol Hcl) .Marland Kitchen.. 1 Tablet Daily 2)  Onglyza 5 Mg Tabs (Saxagliptin Hcl) .Marland Kitchen.. 1 Tablet Daily 3)  Hydrochlorothiazide 25 Mg Tabs (Hydrochlorothiazide) .Marland Kitchen.. 1 Tablet Daily 4)  Zegerid 40-1100 Mg Caps (Omeprazole-Sodium Bicarbonate) .Marland Kitchen.. 1 Once Daily 5)  Estradiol 1 Mg Tabs (Estradiol) .Marland Kitchen.. 1 Tablet Daily 6)  Osteo Bi-Flex Joint Shield  Tabs (Misc Natural Products) .Marland Kitchen.. 1 Daily 7)  Fish Oil 1000 Mg Caps (Omega-3 Fatty Acids) .Marland Kitchen.. 1 Capsule Once Daily 8)  Multivitamins  Caps (Multiple Vitamin) .Marland Kitchen.. 1 Capsule Daily  Allergies (verified): 1)  ! Novocain 2)  ! Sular (Nisoldipine) 3)  ! * Percet  Family History: Family History of Alcoholism/Addiction Family History of Arthritis Family History of Colon CA 1st degree relative <60 Family History High cholesterol Family History  Hypertension  dm:  mother  Social History: Reviewed history and no changes required. married Occupation: Archivist Retired Never Smoked Alcohol use-no Occupation:  employed Smoking Status:  never  Review of Systems       The patient complains of chest pain and headaches.         denies weight loss, blurry vision, menopausal sxs, n/v, urinary frequency, memory loss, depression, hypoglycemia, and rhinorrhea.  she has easy bruising, doe and excessive diaphoresis.    Physical Exam  General:  obese.  no distress  Head:  head: no deformity eyes: no periorbital swelling, no proptosis external nose and ears are normal mouth: no lesion seen Neck:  Supple without thyroid enlargement or tenderness.  Chest Wall:  nontender Lungs:  Clear to auscultation bilaterally. Normal respiratory effort.  Heart:  Regular rate and rhythm without murmurs or gallops noted. Normal S1,S2.   Abdomen:  abdomen is soft, nontender.  no hepatosplenomegaly.   not distended.  no hernia  Msk:  muscle bulk and strength are grossly normal.  no obvious joint swelling.  gait is normal and steady  Pulses:  dorsalis pedis intact bilat.  no carotid bruit  Extremities:  no deformity.  no ulcer on the feet.  feet are of normal color and temp.   1+ right pedal edema and 1+ left pedal edema.   Neurologic:  cn 2-12 grossly intact.   readily  moves all 4's.   sensation is intact to touch on the feet  Skin:  normal texture and temp.  no rash.  not diaphoretic  Cervical Nodes:  No significant adenopathy.  Psych:  Alert and cooperative; normal mood and affect; normal attention span and concentration.   Additional Exam:  a1c=7.3 (june, 2011)   Impression & Recommendations:  Problem # 1:  DM (ICD-250.00) i agree with dr nyland that she should not lower the a1c to below 7.0 with this sulfonylurea-containing regimen.    Problem # 2:  edema this precludes actos rx  Problem # 3:  ULCERATIVE COLITIS  (ICD-556.9) this precludes rx with metformin.  Medications Added to Medication List This Visit: 1)  Bystolic 10 Mg Tabs (Nebivolol hcl) .Marland Kitchen.. 1 tablet daily 2)  Onglyza 5 Mg Tabs (Saxagliptin hcl) .Marland Kitchen.. 1 tablet daily 3)  Hydrochlorothiazide 25 Mg Tabs (Hydrochlorothiazide) .Marland Kitchen.. 1 tablet daily 4)  Zegerid 40-1100 Mg Caps (Omeprazole-sodium bicarbonate) .Marland Kitchen.. 1 once daily 5)  Estradiol 1 Mg Tabs (Estradiol) .Marland Kitchen.. 1 tablet daily 6)  Osteo Bi-flex Joint Shield Tabs (Misc natural products) .Marland Kitchen.. 1 daily 7)  Fish Oil 1000 Mg Caps (Omega-3 fatty acids) .Marland Kitchen.. 1 capsule once daily 8)  Multivitamins Caps (Multiple vitamin) .Marland Kitchen.. 1 capsule daily 9)  Pentasa 500 Mg Cr-caps (Mesalamine) .Marland Kitchen.. 1 tab three times a day 10)  Glipizide Xl 2.5 Mg Xr24h-tab (Glipizide) .Marland Kitchen.. 1 tab each am 11)  Bromocriptine Mesylate 2.5 Mg Tabs (Bromocriptine mesylate) .... 1/2 tab at bedtime 12)  Bystolic 5 Mg Tabs (Nebivolol hcl) .Marland Kitchen.. 1 once daily  Other Orders: New Patient Level IV (63016)  Patient Instructions: 1)  good diet and exercise habits significanly improve the control of your diabetes.  please let me know if you wish to be referred to a dietician.  high blood sugar is very risky to your health.  you should see an eye doctor every year. 2)  controlling your blood pressure and cholesterol drastically reduces the damage diabetes does to your body.  this also applies to quitting smoking.  please discuss these with your doctor.  you should take an aspirin every day, unless you have been advised by a doctor not to. 3)  check your blood sugar 1-2 times a day.  vary the time of day when you check, between before the 3 meals, and at bedtime.  also check if you have symptoms of your blood sugar being too high or too low.  please keep a record of the readings and bring it to your next appointment here.  please call us sooner if you are having low blood sugar episodes. 4)  cc suzanne miller. 5)  reduce glipizide-xr to 2.5 mg each  am 6)  add bromocriptine 1/2 of 2.5 mg at bedtime 7)  Please schedule a follow-up appointment in 3 months. Prescriptions: ONGLYZA 5 MG TABS (SAXAGLIPTIN HCL) 1 tablet daily  #30 x 11   Entered and Authorized by:   Donavan Foil MD   Signed by:   Donavan Foil MD on 04/12/2010   Method used:   Electronically to        [facility] [phone]* (retail)       [address]       [place], [place]  [address]       Ph: [phone] or [phone]       Fax: [phone]   RxID:   [id number] BROMOCRIPTINE MESYLATE 2.5  MG TABS (BROMOCRIPTINE MESYLATE) 1/2 tab at bedtime  #15 x 11   Entered and Authorized by:   Donavan Foil MD   Signed by:   Donavan Foil MD on 04/12/2010   Method used:   Electronically to        [facility] [phone]* (retail)       [address]       [place], [place]  [address]       Ph: [phone] or [phone]       Fax: [phone]   RxID:   [id number] GLIPIZIDE XL 2.5 MG XR24H-TAB (GLIPIZIDE) 1 tab each am  #30 x 11   Entered and Authorized by:   Donavan Foil MD   Signed by:   Donavan Foil MD on 04/12/2010   Method used:   Electronically to        [facility] [phone]* (retail)       [place], [place]  [address]       Ph: [phone] or [phone]       Fax: [phone]   RxID:   [id number]    Preventive Care Screening  Mammogram:    Date:  04/30/2009    Results:  normal   Pap Smear:    Date:  04/30/2009    Results:  normal   Last Tetanus Booster:    Date:  09/30/2008    Results:  Historical   Colonoscopy:    Date:  10/01/2007    Results:  normal     Immunization History:  Influenza Immunization History:    Influenza:  historical (06/30/2009)

## 2010-10-30 NOTE — Progress Notes (Signed)
Summary: Nuclear Pre-Procedure  Phone Note Outgoing Call   Call placed by: Perrin Maltese, EMT-P,  August 15, 2010 12:09 PM Summary of Call: Reviewed information on [facility] Information Sheet (see scanned document for further details).  Spoke with patient.     Nuclear Med Background Indications for Stress Test: Evaluation for Ischemia   History: Echo  History Comments: 09/11 STRESS ECHO NL   Symptoms: Chest Pain, Chest Pressure    Nuclear Pre-Procedure Cardiac Risk Factors: Family History - CAD, Hypertension, NIDDM Height (in): 70  Nuclear Med Study Referring MD:  B.Crenshaw

## 2010-10-30 NOTE — Assessment & Plan Note (Signed)
Summary: new / medicare / #cd   Vital Signs:  Patient profile:   65 year old female Height:      61 inches (154.94 cm) Weight:      188.8 pounds (85.82 kg) O2 Sat:      97 % on Room air Temp:     97.9 degrees F (36.61 degrees C) oral Pulse rate:   69 / minute BP sitting:   128 / 74  (left arm) Cuff size:   large  Vitals Entered By: Tomma Lightning RMA (May 29, 2010 9:50 AM)  O2 Flow:  Room air CC: New patient Is Patient Diabetic? Yes Did you bring your meter with you today? Yes Pain Assessment Patient in pain? no        Primary Care Provider:  Rowe Clack MD  CC:  New patient.  History of Present Illness: new pt to me but known to our endo specialist - here to est for PC needs - prev follwed with dr. Edrick Oh  1) DM2 - follows with endo for same - dx 2010 - no known chronic complications.  she has never been on insulin.  she was unable to tolerate metformin (diarrhea), januvia (headache), byetta (abdominal bruising), and actos (edema).  she now takes onglyza and glipizide.  the glipizide was added 09/2009.  no cbg record, but states cbg's are well-controlled. pt says her diet and exercise are good.  2) HTN - reports compliance with ongoing medical treatment and no changes in medication dose or frequency. denies adverse side effects related to current therapy. would prefer to have generics for cost control if possible - chronic LE edema  3) GERD - reports compliance with ongoing medical treatment and no changes in medication dose or frequency. denies adverse side effects related to current therapy.   4) Ulcerative colitis -quiet symptoms at this time - follwed with GI for same - reports compliance with ongoing medical treatment and no changes in medication dose or frequency. denies adverse side effects related to current therapy.   c/o CP -  2 episodes in past few weeks - onset at rest - nonexertional, not positional 1st spell lasted minutes then 2nd episode lasted  nearly 1 hour - no pain in past 10days - no pain now describes as SS chest "tightness"  a/w palp and racing symptoms dizzy and light head - but no syncope denies radiation pain or n/v -  stress test done years ago prior to knee surg -   Preventive Screening-Counseling & Management  Alcohol-Tobacco     Smoking Status: never  Caffeine-Diet-Exercise     Exercise Counseling: to improve exercise regimen     Depression Counseling: not indicated; screening negative for depression  Safety-Violence-Falls     Violence Counseling: not indicated; no violence risk noted     Fall Risk Counseling: not indicated; no significant falls noted  Clinical Review Panels:  Prevention   Last Mammogram:  Pt states she had spoton (L) breast. Had u/s done. will repeat every six months (05/31/2009)   Last Pap Smear:  normal (04/30/2009)   Last Colonoscopy:  normal (10/01/2007)  Immunizations   Last Tetanus Booster:  Historical (09/30/2008)   Last Flu Vaccine:  Historical (06/30/2009)   Current Medications (verified): 1)  Bystolic 10 Mg Tabs (Nebivolol Hcl) .Marland Kitchen.. 1 Tablet Daily 2)  Onglyza 5 Mg Tabs (Saxagliptin Hcl) .Marland Kitchen.. 1 Tablet Daily 3)  Hydrochlorothiazide 25 Mg Tabs (Hydrochlorothiazide) .Marland Kitchen.. 1 Tablet Daily 4)  Zegerid 40-1100 Mg Caps (Omeprazole-Sodium Bicarbonate) .Marland KitchenMarland KitchenMarland Kitchen  1 Once Daily 5)  Estradiol 1 Mg Tabs (Estradiol) .Marland Kitchen.. 1 Tablet Daily 6)  Osteo Bi-Flex Joint Shield  Tabs (Misc Natural Products) .Marland Kitchen.. 1 Daily 7)  Fish Oil 1000 Mg Caps (Omega-3 Fatty Acids) .Marland Kitchen.. 1 Capsule Once Daily 8)  Multivitamins  Caps (Multiple Vitamin) .Marland Kitchen.. 1 Capsule Daily 9)  Pentasa 500 Mg Cr-Caps (Mesalamine) .Marland Kitchen.. 1 Tab Three Times A Day 10)  Bromocriptine Mesylate 2.5 Mg Tabs (Bromocriptine Mesylate) .... 1/2 Tab At Bedtime 11)  Bystolic 5 Mg Tabs (Nebivolol Hcl) .Marland Kitchen.. 1 Once Daily 12)  Metformin Hcl 500 Mg Tabs (Metformin Hcl) .... Take 1 By Mouth Once Daily 13)  Vitamin D 1000 Unit Tabs (Cholecalciferol) .... Take 1 By  Mouth Once Daily 14)  Chromium Picolinate 200 Mcg Tabs (Chromium Picolinate) .... Take 1 Po Once Daily 15)  Alprazolam 0.25 Mg Tabs (Alprazolam) .... Take 1 Three Times A Day As Needed  Allergies: 1)  ! Novocain 2)  ! Sular (Nisoldipine) 3)  ! 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 I have reviewed all other systems and they were negative.   Physical Exam  General:  alert, well-developed, well-nourished, and cooperative to examination.  overweight-appearing.   Neck:  thick, supple, full ROM, no masses, no thyromegaly; no thyroid nodules or tenderness. no JVD or carotid bruits.    Lungs:  normal respiratory effort, no intercostal retractions or use of accessory muscles; normal breath sounds bilaterally - no crackles and no wheezes.    Heart:  normal rate, regular rhythm, no murmur, and no rub. BLE with trace edema. Abdomen:  soft, non-tender, normal bowel sounds, no distention; no masses and no appreciable hepatomegaly or splenomegaly.   Msk:  No deformity or scoliosis noted of thoracic or lumbar spine.   Neurologic:  alert & oriented X3 and cranial nerves II-XII symetrically intact.  strength normal in all extremities, sensation intact to light touch, and  gait normal. speech fluent without dysarthria or aphasia; follows commands with good comprehension.  Psych:  Oriented X3, memory intact for recent and remote, normally interactive, good eye contact, slightly anxious appearing, not depressed appearing, and not agitated.      Impression & Recommendations:  Problem # 1:  CHEST PAIN (ICD-786.50) given 2 episodes in past few weeks and multiple CRF - refer for stress test now Orders: TLB-Lipid Panel (80061-LIPID) Echo- Stress (Stress Echo)  Problem # 2:  DIABETES MELLITUS, TYPE II (ICD-250.00) following with endo for same send for labs and check a1c now The following medications were removed from the medication list:    Glipizide Xl 2.5 Mg Xr24h-tab (Glipizide) .Marland Kitchen... 1 tab each am Her updated medication list for this problem includes:    Onglyza 5 Mg Tabs (Saxagliptin hcl) .Marland Kitchen... 1 tablet daily    Metformin Hcl 500 Mg Tabs (Metformin hcl) .Marland Kitchen... Take  1 by mouth once daily  Orders: TLB-Lipid Panel (80061-LIPID) TLB-A1C / Hgb A1C (Glycohemoglobin) (83036-A1C) TLB-BMP (Basic Metabolic Panel-BMET) (33825-[id number]) TLB-Microalbumin/Creat Ratio, Urine (82043-MALB) Echo- Stress (Stress Echo)  Problem # 3:  HYPERTENSION (ICD-401.9) consider change from bystolic to generic after stress test complete - ?need Bbloc or try alt med note multiple drug intolerances in past Her updated medication list for this problem includes:    Bystolic 10 Mg Tabs (Nebivolol hcl) .Marland Kitchen... 1 tablet daily with 5 mg tab    Bystolic 5 Mg Tabs (Nebivolol hcl) .Marland Kitchen... 1 once daily with 67m tab    Hydrochlorothiazide 25 Mg Tabs (Hydrochlorothiazide) ..Marland Kitchen.. 1 tablet daily  Orders: TLB-Lipid Panel (80061-LIPID) TLB-BMP (Basic Metabolic Panel-BMET) ([id number]-[id number] Echo- Stress (Stress Echo)  BP today: 128/74 Prior BP: 130/82 (04/12/2010)  Problem # 4:  ULCERATIVE COLITIS (ICD-556.9) follows with GI for same - cont curretn med mgmt - symptoms stable  Problem # 5:  GERD  (ICD-530.81)  Her updated medication list for this problem includes:    Dexilant 60 Mg Cpdr (Dexlansoprazole) ..Marland Kitchen.. 1 by mouth once daily  Orders: TLB-CBC Platelet - w/Differential (85025-CBCD)  Complete Medication List: 1)  Bystolic 10 Mg Tabs (Nebivolol hcl) ..Marland Kitchen. 1 tablet daily with 5 mg tab 2)  Bystolic 5 Mg Tabs (Nebivolol hcl) ..Marland Kitchen. 1 once daily with 182mtab 3)  Onglyza 5 Mg Tabs (Saxagliptin hcl) ...Marland Kitchen 1 tablet daily 4)  Hydrochlorothiazide 25 Mg Tabs (Hydrochlorothiazide) ...Marland Kitchen 1 tablet daily 5)  Dexilant 60 Mg Cpdr (Dexlansoprazole) ...Marland Kitchen 1 by mouth once daily 6)  Estradiol 1 Mg Tabs (Estradiol) ...Marland Kitchen 1 tablet daily 7)  Osteo Bi-flex Joint Shield Tabs (Misc natural products) ...Marland Kitchen 1 daily 8)  Fish Oil 1000 Mg Caps (Omega-3 fatty acids) ...Marland Kitchen 1 capsule once daily 9)  Multivitamins Caps (Multiple vitamin) ...Marland Kitchen 1 capsule daily 10)  Pentasa 500 Mg Cr-caps (Mesalamine) ...Marland Kitchen 1 tab by mouth two times a day 11)  Bromocriptine Mesylate 2.5 Mg Tabs (Bromocriptine mesylate) .... 1/2 tab at bedtime 12)  Metformin Hcl 500 Mg Tabs (Metformin hcl) .... Take 1 by mouth once daily 13)  Vitamin D 1000 Unit Tabs (Cholecalciferol) .... Take 1 by mouth once daily 14)  Chromium Picolinate 200 Mcg Tabs (Chromium picolinate) .... Take 1 po once daily 15)  Alprazolam 0.25 Mg Tabs (Alprazolam) .... Take 1 three times a day as needed  Patient Instructions: 1)  it was good to see you today. 2)  medications and medications reviewed today - no changes recommended at this time - 3)  will send for records from dr. nyLeonarda Salono review 4)  test(s) ordered today - your results will be posted on the phone tree for review in 48-72 hours from the time of test completion; call [phone] enter your 9 digit MRN (listed above on this page, just below your name); if any changes need to be made or there are abnormal results, you will be contacted directly.  5)  we'll make referral for cardiac stress test. Our office will  contact you regarding this appointment once made.  6)  Please keep follow-up appointment in October as scheduled to review, sooner if problems.  Prescriptions: ALPRAZOLAM 0.25 MG TABS (ALPRAZOLAM) take 1 three times a day as needed  #60 x 2   Entered and Authorized by:   VaRowe ClackD   Signed by:   VaRowe ClackD on 05/29/2010   Method used:   Print then Give to Patient   RxID:   [id number]  Mammogram  Procedure date:  05/31/2009  Findings:      Pt states she had spoton (L) breast. Had u/s done. will repeat every six months

## 2010-10-30 NOTE — Letter (Signed)
Summary: [name] MD  [name] MD   Imported By: Bubba Hales 06/21/2010 07:32:42  _____________________________________________________________________  External Attachment:    Type:   Image     Comment:   External Document

## 2010-10-30 NOTE — Assessment & Plan Note (Signed)
Summary: Cardiology Nuclear Testing  Nuclear Med Background Indications for Stress Test: Evaluation for Ischemia   History: Echo  History Comments: 09/11 STRESS ECHO NL   Symptoms: Chest Pain, Chest Pressure, Palpitations    Nuclear Pre-Procedure Cardiac Risk Factors: Family History - CAD, Hypertension, NIDDM Caffeine/Decaff Intake: none NPO After: 7:30 AM Lungs: clear IV 0.9% NS with Angio Cath: 22g     IV Site: L Hand IV Started by: Matilde Haymaker, RN Chest Size (in) 36     Cup Size DD     Height (in): 61 Weight (lb): 185 BMI: 35.08 Tech Comments: Metoprolol held x 22hrs.  Nuclear Med Study 1 or 2 day study:  1 day     Stress Test Type:  Carlton Adam Reading MD:  Kirk Ruths, MD     Referring MD:  B.Crenshaw Resting Radionuclide:  Technetium 79mTetrofosmin     Resting Radionuclide Dose:  11 mCi  Stress Radionuclide:  Technetium 936metrofosmin     Stress Radionuclide Dose:  33 mCi   Stress Protocol  Max Systolic BP: 18352m Hg Lexiscan: 0.4 mg   Stress Test Technologist:  SaPerrin MalteseEMT-P     Nuclear Technologist:  ToAnnye RuskCNMT  Rest Procedure  Myocardial perfusion imaging was performed at rest 45 minutes following the intravenous administration of Technetium 997mtrofosmin.  Stress Procedure  The patient received IV Lexiscan 0.4 mg over 15-seconds.  Technetium 32m81mrofosmin injected at 30-seconds.  There were no significant changes, but freq pvcs/Vcupltes/V-Bigemeny with infusion.  Quantitative spect images were obtained after a 45 minute delay.  QPS Raw Data Images:  Acquisition technically good; normal left ventricular size. Stress Images:  Normal homogeneous uptake in all areas of the myocardium. Rest Images:  Normal homogeneous uptake in all areas of the myocardium. Subtraction (SDS):  No evidence of ischemia. Transient Ischemic Dilatation:  1.20  (Normal <1.22)  Lung/Heart Ratio:  .35  (Normal <0.45)  Quantitative Gated Spect Images QGS  cine images:  non-gated study   Overall Impression  Exercise Capacity: Lexiscan with no exercise. BP Response: Normal blood pressure response. Clinical Symptoms: No chest pain ECG Impression: No significant ST segment change suggestive of ischemia; frequent PVCs and occasional couplet. Overall Impression: Normal lexiscan nuclear study with no ischemia or infarction.

## 2010-10-30 NOTE — Progress Notes (Signed)
  Phone Note Call from Patient Call back at [facility] Phone [phone]   Caller: Patient Summary of Call: Pt called stating that episodes of radip heart beats have been occuring more frequently and lasting longer x 4-5 days. Pt states that she has also started experiencing twitching of her chin around the same time as these episode and is concerned. Please advise. Initial call taken by: Crissie Sickles, [place],  August 08, 2010 11:22 AM  Follow-up for Phone Call        will refer to cards for eval and tx as needed - pcc will arrange Follow-up by: Rowe Clack MD,  August 08, 2010 12:11 PM  Additional Follow-up for Phone Call Additional follow up Details #1::        Pt informed via VM per HIPAA Additional Follow-up by: Crissie Sickles, [place],  August 08, 2010 2:19 PM  New Problems: PALPITATIONS, RECURRENT (ICD-785.1)   New Problems: PALPITATIONS, RECURRENT (ICD-785.1)

## 2010-10-30 NOTE — Letter (Signed)
Summary: [name] MD  [name] MD   Imported By: Bubba Hales 06/21/2010 07:39:20  _____________________________________________________________________  External Attachment:    Type:   Image     Comment:   External Document

## 2010-10-30 NOTE — Letter (Signed)
Summary: [name] MD  [name] MD   Imported By: Bubba Hales 06/21/2010 07:33:45  _____________________________________________________________________  External Attachment:    Type:   Image     Comment:   External Document

## 2010-11-01 NOTE — Progress Notes (Signed)
Summary: URI?  Phone Note Call from Patient Call back at [facility] Phone [phone]   Caller: Patient Summary of Call: Pt called stating she is again sick, cough, congestion runny nose and mild ST and ear pain. Pt says she thinks she read that one of her medication can cause chronic URI and if sho she would like a substitute medication. Pt is also requesting an ABX, please advise. Initial call taken by: Crissie Sickles, [place],  October 22, 2010 8:59 AM  Follow-up for Phone Call        i would not recommend any change in rx meds due to URI symptoms at this time given community prevlence of URI infx during this season - ok for Zpak (erx done) - use claritin 41m once daily for runny nose and mucinex two times a day for cough symptoms  Follow-up by: VRowe ClackMD,  October 22, 2010 9:47 AM  Additional Follow-up for Phone Call Additional follow up Details #1::        Pt advised of above and understood Additional Follow-up by: DCrissie Sickles [place]  October 22, 2010 10:56 AM    New/Updated Medications: AZITHROMYCIN 250 MG TABS (AZITHROMYCIN) 2 tabs by mouth today, then 1 by mouth daily starting tomorrow CLARITIN 10 MG TABS (LORATADINE) 1 by mouth once daily as needed for runny nose symptoms MUCINEX 600 MG XR12H-TAB (GUAIFENESIN) 1 by mouth two times a day for cough symptoms Prescriptions: AZITHROMYCIN 250 MG TABS (AZITHROMYCIN) 2 tabs by mouth today, then 1 by mouth daily starting tomorrow  #6 x 0   Entered and Authorized by:   VRowe ClackMD   Signed by:   VRowe ClackMD on 10/22/2010   Method used:   Electronically to        [phone] (retail)       [address]      [place]  [address]      Ph: [phone]      Fax: [phone]  RxID:   [id number]

## 2010-11-01 NOTE — Procedures (Signed)
Summary: Patient Activity Report  Patient Activity Report   Imported By: Gemma Payor 09/14/2010 16:17:48  _____________________________________________________________________  External Attachment:    Type:   Image     Comment:   External Document

## 2010-11-01 NOTE — Progress Notes (Signed)
Summary: Event monitor  Phone Note Outgoing Call Call back at Home Phone [phone]   Call placed by: Susette Racer,  August 10, 2010 1:32 PM Summary of Call: call let messege for pt to call back for appt for monitor Initial call taken by: Susette Racer,  August 10, 2010 1:33 PM  Follow-up for Phone Call        Pt call back ,will have monitor same day of stress test Follow-up by: Susette Racer,  August 13, 2010 8:12 AM     Appended Document: Event monitor pt made aware monitor shows sinus with PVC's

## 2010-11-01 NOTE — Assessment & Plan Note (Signed)
Summary: f/u appt/cd   Vital Signs:  Patient profile:   65 year old female Height:      61 inches (154.94 cm) Weight:      183.8 pounds (83.55 kg) O2 Sat:      96 % on Room air Temp:     98.7 degrees F (37.06 degrees C) oral Pulse rate:   68 / minute BP sitting:   122 / 82  (left arm) Cuff size:   large  Vitals Entered By: Tomma Lightning RMA (October 11, 2010 11:27 AM)  O2 Flow:  Room air CC: follow-up visit Is Patient Diabetic? Yes Did you bring your meter with you today? No Pain Assessment Patient in pain? no      Comments Pt want to discuss meds states not sure which meds she is taking @ night is making her nausated. Also want to change Zegrid to something else too expensive. Requesting labswork also   Primary Care Provider:  Rowe Clack MD  CC:  follow-up visit.  History of Present Illness: here for f/u cont cough - tessalon ineffective, tussionex caused itch, otc meds ineffective no fever or sputum  c/o nausea each AM - ?med induced  feels fatigued: "something worng" - requests labs  reviewed chronic med issues:  1) DM2 - follows with endo for same - dx 2010 - no known chronic complications.  she has never been on insulin.  she was unable to tolerate metformin (diarrhea), januvia (headache), byetta (abdominal bruising), glipizide and actos (edema).  she now takes onglyza, metformin and bromocrip. states cbg's are well-controlled. pt says her diet and exercise are good.  2) HTN - reports compliance with ongoing medical treatment and no changes in medication dose or frequency. denies adverse side effects related to current therapy. would prefer to have generics for cost control if possible - chronic LE edema  3) GERD - reports compliance with ongoing medical treatment and no changes in medication dose or frequency. denies adverse side effects related to current therapy.  planning to try baking soda with omeprazole for cost savings in place of zegrid  4)  Ulcerative colitis -quiet symptoms at this time - follows with GI for same - reports compliance with ongoing medical treatment and no changes in medication dose or frequency. denies adverse side effects related to current therapy.   5) CP - ongoing occ "fluttering", usually at night when still 2 episodes since completion of stress test 06/19/10 (normal) always occurs at rest - nonexertional, not positional  6) R shoulder pain - only mild and improved since onset early 05/2010 (precipitated by fall) improved sleep with use of robaxin - ?ok to cont same as needed   Clinical Review Panels:  Immunizations   Last Tetanus Booster:  Historical (09/30/2008)   Last Flu Vaccine:  Fluvax 3+ (07/12/2010)  Lipid Management   Cholesterol:  200 (05/29/2010)   LDL (bad choesterol):  80 (01/26/2010)   HDL (good cholesterol):  41.60 (05/29/2010)   Triglycerides:  196 (01/26/2010)  Diabetes Management   HgBA1C:  7.7 (07/12/2010)   Creatinine:  0.9 (05/29/2010)   Last Flu Vaccine:  Fluvax 3+ (07/12/2010)  CBC   WBC:  6.3 (05/29/2010)   RBC:  4.39 (05/29/2010)   Hgb:  14.2 (05/29/2010)   Hct:  40.3 (05/29/2010)   Platelets:  198.0 (05/29/2010)   MCV  91.8 (05/29/2010)   MCHC  35.3 (05/29/2010)   RDW  13.1 (05/29/2010)   PMN:  39.6 (05/29/2010)   Lymphs:  49.7 (  05/29/2010)   Monos:  8.4 (05/29/2010)   Eosinophils:  1.9 (05/29/2010)   Basophil:  0.4 (05/29/2010)  Complete Metabolic Panel   Glucose:  153 (05/29/2010)   Sodium:  139 (05/29/2010)   Potassium:  4.9 (05/29/2010)   Chloride:  96 (05/29/2010)   CO2:  31 (05/29/2010)   BUN:  14 (05/29/2010)   Creatinine:  0.9 (05/29/2010)   Albumin:  3.9 (03/09/2010)   Total Protein:  7.1 (03/09/2010)   Calcium:  9.7 (05/29/2010)   Total Bili:  0.5 (03/09/2010)   Alk Phos:  37 (03/09/2010)   SGPT (ALT):  23 (03/09/2010)   SGOT (AST):  29 (03/09/2010)   Current Medications (verified): 1)  Metoprolol Succinate 100 Mg Xr24h-Tab (Metoprolol  Succinate) .Marland Kitchen.. 1 By Mouth Once Daily 2)  Onglyza 5 Mg Tabs (Saxagliptin Hcl) .Marland Kitchen.. 1 Tablet Daily 3)  Hydrochlorothiazide 25 Mg Tabs (Hydrochlorothiazide) .Marland Kitchen.. 1 Tablet Daily 4)  Estradiol 1 Mg Tabs (Estradiol) .Marland Kitchen.. 1 Tablet Daily 5)  Fish Oil 1000 Mg Caps (Omega-3 Fatty Acids) .Marland Kitchen.. 1 Capsule Once Daily 6)  Multivitamins  Caps (Multiple Vitamin) .Marland Kitchen.. 1 Capsule Daily 7)  Pentasa 500 Mg Cr-Caps (Mesalamine) .Marland Kitchen.. 1 Tab By Mouth Two Times A Day 8)  Bromocriptine Mesylate 2.5 Mg Tabs (Bromocriptine Mesylate) .Marland Kitchen.. 1 Tab At Bedtime 9)  Vitamin D 1000 Unit Tabs (Cholecalciferol) .... Take 1 By Mouth Once Daily 10)  Chromium Picolinate 200 Mcg Tabs (Chromium Picolinate) .... Take 1 Po Once Daily 11)  Alprazolam 0.25 Mg Tabs (Alprazolam) .... Take 1 Three Times A Day As Needed 12)  Zegerid 40-1100 Mg Caps (Omeprazole-Sodium Bicarbonate) .Marland Kitchen.. 1 By Mouth Once Daily 13)  Metformin Hcl 500 Mg Xr24h-Tab (Metformin Hcl) .... 2 Tabs Each Am  Allergies (verified): 1)  ! Novocain 2)  ! Sular (Nisoldipine) 3)  ! Percocet 4)  ! * Tussonex Cough Syrup  Past History:  Past Medical History: HYPERTENSION MENOPAUSAL DISORDER  GERD  DIABETES MELLITUS, TYPE II  ULCERATIVE COLITIS  Md roster:  [facility] - [facility] -[facility]  Review of Systems  The patient denies hoarseness, chest pain, abdominal pain, and severe indigestion/heartburn.    Physical Exam  General:  alert, well-developed, well-nourished, and cooperative to examination.  overweight-appearing. Eyes:  vision grossly intact; pupils equal, round and reactive to light.  conjunctiva and lids normal.    Mouth:  teeth and gums in good repair; mucous membranes moist, without lesions or ulcers. oropharynx clear without exudate, min erythema.  Neck:  thick, supple, full ROM, no masses, no thyromegaly; no thyroid nodules or tenderness. no JVD or carotid bruits.    Lungs:  normal respiratory effort, no intercostal retractions or use of accessory muscles; no  rhonchi, good air mvmt bilaterally - no crackles or wheeze.    Heart:  normal rate, regular rhythm, no murmur, and no rub. BLE with trace edema.   Impression & Recommendations:  Problem # 1:  ACUTE BRONCHITIS (ICD-466.0) Assessment Improved acute infx resolved but persisting dry cough intol and ineffective tx reviewed (tussionex, robitussin and tessalon) prev did well with tol tylenol #3 for pain - will try codiene for cough symptoms (and nausea control) Her updated medication list for this problem includes:    Promethazine-codeine 6.25-10 Mg/63m Syrp (Promethazine-codeine) ..Marland KitchenMarland KitchenMarland KitchenMarland Kitchen5 cc by mouth every 6 hours as needed for cough  Problem # 2:  FATIGUE (ICD-780.79) nonspecific exam and hx - check labs Orders: TLB-BMP (Basic Metabolic Panel-BMET) ([id number] TLB-CBC Platelet - w/Differential (85025-CBCD) TLB-Hepatic/Liver Function Pnl (80076-HEPATIC) TLB-TSH (Thyroid Stimulating  Hormone) (84443-TSH)  Problem # 3:  GERD (ICD-530.81)  ok to change zegrid to omeprazole for cost - erx done pt may take addl baking soda as needed  Her updated medication list for this problem includes:    Omeprazole 40 Mg Cpdr (Omeprazole) .Marland Kitchen... 1 by mouth once daily  Labs Reviewed: Hgb: 14.2 (05/29/2010)   Hct: 40.3 (05/29/2010)  Orders: Prescription Created Electronically [phone])  Complete Medication List: 1)  Metoprolol Succinate 100 Mg Xr24h-tab (Metoprolol succinate) .Marland Kitchen.. 1 by mouth once daily 2)  Onglyza 5 Mg Tabs (Saxagliptin hcl) .Marland Kitchen.. 1 tablet daily 3)  Hydrochlorothiazide 25 Mg Tabs (Hydrochlorothiazide) .Marland Kitchen.. 1 tablet daily 4)  Estradiol 1 Mg Tabs (Estradiol) .Marland Kitchen.. 1 tablet daily 5)  Fish Oil 1000 Mg Caps (Omega-3 fatty acids) .Marland Kitchen.. 1 capsule once daily 6)  Multivitamins Caps (Multiple vitamin) .Marland Kitchen.. 1 capsule daily 7)  Pentasa 500 Mg Cr-caps (Mesalamine) .Marland Kitchen.. 1 tab by mouth two times a day 8)  Bromocriptine Mesylate 2.5 Mg Tabs (Bromocriptine mesylate) .Marland Kitchen.. 1 tab at bedtime 9)  Vitamin D  1000 Unit Tabs (Cholecalciferol) .... Take 1 by mouth once daily 10)  Chromium Picolinate 200 Mcg Tabs (Chromium picolinate) .... Take 1 po once daily 11)  Alprazolam 0.25 Mg Tabs (Alprazolam) .... Take 1 three times a day as needed 12)  Omeprazole 40 Mg Cpdr (Omeprazole) .Marland Kitchen.. 1 by mouth once daily 13)  Metformin Hcl 500 Mg Xr24h-tab (Metformin hcl) .... 2 tabs each am 14)  Promethazine-codeine 6.25-10 Mg/39m Syrp (Promethazine-codeine) .... 5 cc by mouth every 6 hours as needed for cough  Patient Instructions: 1)  it was good to see you today. 2)  try codiene cough syrup 3)  change zegrid to generic omeprazole  4)  your prescriptions have been electronically submitted to your pharmacy. Please take as directed. Contact our office if you believe you're having problems with the medication(s).  5)  test(s) ordered today - your results will be posted on the phone tree for review in 48-72 hours from the time of test completion; call [phone] enter your 9 digit MRN (listed above on this page, just below your name); if any changes need to be made or there are abnormal results, you will be contacted directly.  Prescriptions: OMEPRAZOLE 40 MG CPDR (OMEPRAZOLE) 1 by mouth once daily  #90 x 1   Entered and Authorized by:   VRowe ClackMD   Signed by:   VRowe ClackMD on 10/11/2010   Method used:   Electronically to        [facility][phone] (retail)       [address] [place]  [address]      Ph: [phone] [phone]      Fax: [phone]  RxID:   [id number] MG/5ML SYRP (PROMETHAZINE-CODEINE) 5 cc by mouth every 6 hours as needed for cough  #100cc x 0   Entered and Authorized by:   VRowe ClackMD   Signed by:   VRowe ClackMD on 10/11/2010   Method used:   Printed then faxed to ...       [facility][phone] (retail)       [address]      [place]  [address]  Ph: [phone] or [phone]       Fax: [phone]   RxID:   [id number]    Orders Added: 1)  TLB-BMP (Basic Metabolic Panel-BMET) [[id number]] 2)  TLB-CBC Platelet - w/Differential [85025-CBCD] 3)  TLB-Hepatic/Liver Function Pnl [80076-HEPATIC] 4)  TLB-TSH (Thyroid Stimulating Hormone) [84443-TSH] 5)  Est. Patient Level IV [86854] 6)  Prescription Created Electronically [phone]

## 2010-11-01 NOTE — Progress Notes (Signed)
Summary: ALT med  Phone Note Call from Patient Call back at [facility] Phone [phone]   Caller: Patient Summary of Call: Pt called stating that cough syrup given at OV is causing itching. Pt is requesting alternative. Initial call taken by: Crissie Sickles, [place],  September 28, 2010 9:15 AM  Follow-up for Phone Call        use tessalon perles - a non-narcotic based alternitive - erx done - thanks Follow-up by: Rowe Clack MD,  September 28, 2010 9:32 AM  Additional Follow-up for Phone Call Additional follow up Details #1::        Pt advised Additional Follow-up by: Crissie Sickles, CMA,  September 28, 2010 11:10 AM    New/Updated Medications: TESSALON PERLES 100 MG CAPS (BENZONATATE) 1 by mouth three times a day x 72h, then as needed for cough symptoms Prescriptions: TESSALON PERLES 100 MG CAPS (BENZONATATE) 1 by mouth three times a day x 72h, then as needed for cough symptoms  #40 x 0   Entered and Authorized by:   Rowe Clack MD   Signed by:   Rowe Clack MD on 09/28/2010   Method used:   Electronically to        [facility] [phone] (retail)       [place], [place]  [address]       Ph: [phone] or [phone]       Fax: [phone]   RxID:   [id number]

## 2010-11-01 NOTE — Progress Notes (Signed)
Summary: cold sxs  Phone Note Call from Patient Call back at Home Phone [phone]   Caller: Patient Call For: Rowe Clack MD Reason for Call: Acute Illness, Talk to Doctor Summary of Call: Pt c/o chest cold. Has a cough with phlegm, sore throat, and is wheezing. Made appt for pt to see md tomorrow, but she want to know what md recommend to take otc since she is not able to come in today. Pls advise Initial call taken by: Tomma Lightning RMA,  September 25, 2010 8:51 AM  Follow-up for Phone Call        can use coricidian hbp cough and cold formula as per box directions - also use extra strength tylenol (531m) 1-2 every 6 hours for ache (like ST) - thanks Follow-up by: VRowe ClackMD,  September 25, 2010 9:05 AM  Additional Follow-up for Phone Call Additional follow up Details #1::        Notifed pt with md response. Additional Follow-up by: LTomma LightningRMA,  September 25, 2010 9:32 AM    New/Updated Medications: CORICIDIN HBP COUGH/COLD 4-30 MG TABS (CHLORPHENIRAMINE-DM) as directed (box label)

## 2010-11-01 NOTE — Progress Notes (Signed)
Summary: lab results  Phone Note Call from Patient   Caller: Patient [phone] Summary of Call: Pt called to check results of labs. Pt and I verified results are not on phone tree system, please advise on results. Initial call taken by: Crissie Sickles, [place],  October 17, 2010 8:21 AM  Follow-up for Phone Call        a1c=7.8.  good, but welchol is added to get down below 7.0. Follow-up by: Donavan Foil MD,  October 17, 2010 8:33 AM  Additional Follow-up for Phone Call Additional follow up Details #1::        Pt advised via VM, told to call back with any further questions or concerns Additional Follow-up by: Crissie Sickles, [place],  October 17, 2010 9:27 AM

## 2010-11-01 NOTE — Assessment & Plan Note (Signed)
Summary: [facility]   Referring Provider:  n[place] Primary Provider:  Rowe Clack MD   History of Present Illness: 65 year old female I saw in November of 2011 for evaluation of palpitations. Stress echocardiogram performed in September of 2011 was interpreted as normal. There was mention that LV function may not have improved as much as expected and if clinical suspicion high consider alternative imaging. Lexiscan Myoview in November of 2011 showed normal perfusion. Study not gated. CardioNet showed sinus with PVCs. Since she was seen previously the patient denies any dyspnea on exertion, orthopnea, PND, pedal edema,  syncope or chest pain. Her palpitations have improved. She occasionally feels a brief skipped but no sustained palpitations.    Current Medications (verified): 1)  Metoprolol Succinate 100 Mg Xr24h-Tab (Metoprolol Succinate) .Marland Kitchen.. 1 By Mouth Once Daily 2)  Onglyza 5 Mg Tabs (Saxagliptin Hcl) .Marland Kitchen.. 1 Tablet Daily 3)  Hydrochlorothiazide 25 Mg Tabs (Hydrochlorothiazide) .Marland Kitchen.. 1 Tablet Daily 4)  Estradiol 1 Mg Tabs (Estradiol) .Marland Kitchen.. 1 Tablet Daily 5)  Fish Oil 1000 Mg Caps (Omega-3 Fatty Acids) .Marland Kitchen.. 1 Capsule Once Daily 6)  Multivitamins  Caps (Multiple Vitamin) .Marland Kitchen.. 1 Capsule Daily 7)  Pentasa 500 Mg Cr-Caps (Mesalamine) .Marland Kitchen.. 1 Tab By Mouth Two Times A Day 8)  Bromocriptine Mesylate 2.5 Mg Tabs (Bromocriptine Mesylate) .Marland Kitchen.. 1 Tab At Bedtime 9)  Vitamin D 1000 Unit Tabs (Cholecalciferol) .... Take 1 By Mouth Once Daily 10)  Chromium Picolinate 200 Mcg Tabs (Chromium Picolinate) .... Take 1 Po Once Daily 11)  Alprazolam 0.25 Mg Tabs (Alprazolam) .... Take 1 Three Times A Day As Needed 12)  Zegerid 40-1100 Mg Caps (Omeprazole-Sodium Bicarbonate) .Marland Kitchen.. 1 By Mouth Once Daily 13)  Metformin Hcl 500 Mg Xr24h-Tab (Metformin Hcl) .... 2 Tabs Each Am 14)  Azithromycin 250 Mg Tabs (Azithromycin) .... 2 Tabs By Mouth Today, Then 1 By Mouth Daily Starting Tomorrow 15)  Tussionex  Pennkinetic Er 10-8 Mg/11m Lqcr (Hydrocod Polst-Chlorphen Polst) .... 5 Cc By Mouth Every 12h As Needed For Cough Symptoms  Allergies: 1)  ! Novocain 2)  ! Sular (Nisoldipine) 3)  ! Percocet  Past History:  Past Medical History: Reviewed history from 09/26/2010 and no changes required. HYPERTENSION MENOPAUSAL DISORDER  GERD  DIABETES MELLITUS, TYPE II  ULCERATIVE COLITIS  Md roster: endo - [facility] -[facility]  Past Surgical History: Reviewed history from 08/10/2010 and no changes required. 1. In 1980, total abdominal hysterectomy with bladder tack. 2. In 1994, bilateral breast reduction due to fibrocystic breast changes. 3. In 1990, rectocele repair. 4. In 1996, right carpal tunnel release. 5. In May 2000, right knee arthroscopy repair. 6. In December 2000, L4-L5 diskectomy,. 7. In February 2002, right knee arthroscopic repair. 8. Cholecystectomy  Social History: Reviewed history from 08/10/2010 and no changes required. 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 Remaining systems are negative.   Vital Signs:  Patient profile:   65year old female Height:      61 inches Weight:      184 pounds BMI:     34.89 Pulse rate:   77 / minute Resp:     14 per minute BP sitting:   139 / 80  (left arm)  Vitals Entered By: Burnett Kanaris (September 27, 2010 11:16 AM)  Physical Exam  General:  Well-developed well-nourished in no acute distress.  Skin is warm and dry.  HEENT is normal.  Neck is supple. No thyromegaly.  Chest is clear to auscultation with normal expansion.  Cardiovascular exam is regular rate and rhythm.    Abdominal exam nontender or distended. No masses palpated. Extremities show no edema. neuro grossly intact    Impression & Recommendations:  Problem # 1:  CHEST PAIN (ICD-786.50) No further chest pain. Myoview negative. No further cardiac evaluation. Her updated medication list for this problem includes:    Metoprolol Succinate 100 Mg Xr24h-tab (Metoprolol succinate) .Marland Kitchen... 1 by mouth once daily  Problem # 2:  PALPITATIONS, RECURRENT (ICD-785.1) Symptoms much improved. CardioNet showed PVCs. Continue beta blocker. Her updated medication list for this problem includes:    Metoprolol Succinate 100 Mg Xr24h-tab (Metoprolol succinate) .Marland Kitchen... 1 by mouth once daily  Problem # 3:  HYPERTENSION (ICD-401.9) Blood pressure controlled on present medications. Will continue. Her updated medication list for this problem includes:    Metoprolol Succinate 100 Mg Xr24h-tab (Metoprolol succinate) .Marland Kitchen... 1 by mouth once daily    Hydrochlorothiazide 25 Mg Tabs (Hydrochlorothiazide) .Marland Kitchen... 1 tablet daily  Problem # 4:  DIABETES MELLITUS, TYPE II (ICD-250.00)  Her updated medication list for this problem includes:    Onglyza 5 Mg Tabs (Saxagliptin hcl) .Marland Kitchen... 1 tablet daily    Metformin Hcl 500 Mg Xr24h-tab (Metformin hcl) .Marland Kitchen... 2 tabs each am  Problem # 5:  GERD (ICD-530.81)  Her updated medication list for this problem includes:    Zegerid 40-1100 Mg Caps (Omeprazole-sodium bicarbonate) .Marland Kitchen... 1 by mouth once daily

## 2010-11-01 NOTE — Assessment & Plan Note (Signed)
Summary: chest cold/ wheezing/lmb   Vital Signs:  Patient profile:   65 year old female Height:      61 inches (154.94 cm) Weight:      184.12 pounds (83.69 kg) O2 Sat:      95 % on Room air Temp:     98.3 degrees F (36.83 degrees C) oral Pulse rate:   80 / minute BP sitting:   128 / 80  (left arm) Cuff size:   large  Vitals Entered By: Tomma Lightning RMA (September 26, 2010 11:02 AM)  O2 Flow:  Room air CC: Chest cold/ wheezing Is Patient Diabetic? Yes Did you bring your meter with you today? No Pain Assessment Patient in pain? yes     Location: sore around rib area   Primary Care Provider:  Rowe Clack MD  CC:  Chest cold/ wheezing.  History of Present Illness: here for URI symptoms  onset 5 days ago started as throat tickles - quickly progressive to chest soreness and cough assoc with myalgias and productive cough (green sputum this AM) +LGF and chills, +wheeze no HA or sinus pressure, no nasal discharge - not relieved with any otc meds  reviewed other chronic med issues: 1) DM2 - follows with endo for same - dx 2010 - no known chronic complications.  she has never been on insulin.  she was unable to tolerate metformin (diarrhea), januvia (headache), byetta (abdominal bruising), and actos (edema).  she now takes onglyza, metformin and bromocrip; off glipizide. states cbg's are well-controlled. pt says her diet and exercise are good.  2) HTN - reports compliance with ongoing medical treatment and no changes in medication dose or frequency. denies adverse side effects related to current therapy. would prefer to have generics for cost control if possible - chronic LE edema  3) GERD - reports compliance with ongoing medical treatment and no changes in medication dose or frequency. denies adverse side effects related to current therapy.  planning to try baking soda with omeprazole for cost savings in place of zegrid  4) Ulcerative colitis -quiet symptoms at this time -  follows with GI for same - reports compliance with ongoing medical treatment and no changes in medication dose or frequency. denies adverse side effects related to current therapy.   5) CP - ongoing occ "fluttering", usually at night when still 2 episodes since completion of stress test 06/19/10 (normal) always occurs at rest - nonexertional, not positional  6) R shoulder pain - only mild and improved since onset early 05/2010 (precipitated by fall) improved sleep with use of robaxin - ?ok to cont same as needed   Clinical Review Panels:  CBC   WBC:  6.3 (05/29/2010)   RBC:  4.39 (05/29/2010)   Hgb:  14.2 (05/29/2010)   Hct:  40.3 (05/29/2010)   Platelets:  198.0 (05/29/2010)   MCV  91.8 (05/29/2010)   MCHC  35.3 (05/29/2010)   RDW  13.1 (05/29/2010)   PMN:  39.6 (05/29/2010)   Lymphs:  49.7 (05/29/2010)   Monos:  8.4 (05/29/2010)   Eosinophils:  1.9 (05/29/2010)   Basophil:  0.4 (05/29/2010)  Complete Metabolic Panel   Glucose:  153 (05/29/2010)   Sodium:  139 (05/29/2010)   Potassium:  4.9 (05/29/2010)   Chloride:  96 (05/29/2010)   CO2:  31 (05/29/2010)   BUN:  14 (05/29/2010)   Creatinine:  0.9 (05/29/2010)   Albumin:  3.9 (03/09/2010)   Total Protein:  7.1 (03/09/2010)   Calcium:  9.7 (  05/29/2010)   Total Bili:  0.5 (03/09/2010)   Alk Phos:  37 (03/09/2010)   SGPT (ALT):  23 (03/09/2010)   SGOT (AST):  29 (03/09/2010)   Current Medications (verified): 1)  Metoprolol Succinate 100 Mg Xr24h-Tab (Metoprolol Succinate) .Marland Kitchen.. 1 By Mouth Once Daily 2)  Onglyza 5 Mg Tabs (Saxagliptin Hcl) .Marland Kitchen.. 1 Tablet Daily 3)  Hydrochlorothiazide 25 Mg Tabs (Hydrochlorothiazide) .Marland Kitchen.. 1 Tablet Daily 4)  Estradiol 1 Mg Tabs (Estradiol) .Marland Kitchen.. 1 Tablet Daily 5)  Fish Oil 1000 Mg Caps (Omega-3 Fatty Acids) .Marland Kitchen.. 1 Capsule Once Daily 6)  Multivitamins  Caps (Multiple Vitamin) .Marland Kitchen.. 1 Capsule Daily 7)  Pentasa 500 Mg Cr-Caps (Mesalamine) .Marland Kitchen.. 1 Tab By Mouth Two Times A Day 8)  Bromocriptine  Mesylate 2.5 Mg Tabs (Bromocriptine Mesylate) .Marland Kitchen.. 1 Tab At Bedtime 9)  Vitamin D 1000 Unit Tabs (Cholecalciferol) .... Take 1 By Mouth Once Daily 10)  Chromium Picolinate 200 Mcg Tabs (Chromium Picolinate) .... Take 1 Po Once Daily 11)  Alprazolam 0.25 Mg Tabs (Alprazolam) .... Take 1 Three Times A Day As Needed 12)  Zegerid 40-1100 Mg Caps (Omeprazole-Sodium Bicarbonate) .Marland Kitchen.. 1 By Mouth Once Daily 13)  Metformin Hcl 500 Mg Xr24h-Tab (Metformin Hcl) .... 2 Tabs Each Am 14)  Coricidin Hbp Cough/cold 4-30 Mg Tabs (Chlorpheniramine-Dm) .... As Directed (Box Label)  Allergies (verified): 1)  ! Novocain 2)  ! Sular (Nisoldipine) 3)  ! Percocet  Past History:  Past Medical History: HYPERTENSION MENOPAUSAL DISORDER  GERD  DIABETES MELLITUS, TYPE II  ULCERATIVE COLITIS  Md roster: [facility]o - [facility]  Review of Systems  The patient denies hoarseness, syncope, dyspnea on exertion, headaches, and hemoptysis.    Physical Exam  General:  alert, well-developed, well-nourished, and cooperative to examination.  overweight-appearing. mildly ill Head:  Normocephalic and atraumatic without obvious abnormalities. No apparent alopecia or balding. Eyes:  vision grossly intact; pupils equal, round and reactive to light.  conjunctiva and lids normal.    Ears:  normal pinnae bilaterally, without erythema, swelling, or tenderness to palpation. TMs clear, without effusion, or cerumen impaction. Hearing grossly normal bilaterally  Mouth:  teeth and gums in good repair; mucous membranes moist, without lesions or ulcers. oropharynx clear without exudate, mod erythema.  Lungs:  normal respiratory effort, no intercostal retractions or use of accessory muscles;  rhonchi with cough but good air mvmt bilaterally - no crackles, mild end exp wheezes.    Heart:  normal rate, regular rhythm, no murmur, and no rub. BLE with trace edema.   Impression & Recommendations:  Problem # 1:  ACUTE BRONCHITIS  (ICD-466.0)  depomedrol for inflammation and wheeze - done today- pt counseled on anticipated spike in cbgs add Zpak and antitussive - erx done + fax reviewed intol of percocet due to confusion but pt willing to try tussionex with hydrocodone Her updated medication list for this problem includes:    Azithromycin 250 Mg Tabs (Azithromycin) .Marland Kitchen... 2 tabs by mouth today, then 1 by mouth daily starting tomorrow    Tussionex Pennkinetic Er 10-8 Mg/38m Lqcr (Hydrocod polst-chlorphen polst) ..Marland KitchenMarland KitchenMarland KitchenMarland Kitchen5 cc by mouth every 12h as needed for cough symptoms  Orders: Depo- Medrol 873m(J1040) Depo- Medrol 4048mJ1030) Admin of Therapeutic Inj  intramuscular or subcutaneous (96(32355rescription Created Electronically ([id number] antibiotics and other medications as directed. Encouraged to push clear liquids, get enough rest, and take acetaminophen as needed. To be seen in 5-7 days if no improvement, sooner if worse.  Problem # 2:  DIABETES MELLITUS, TYPE II (ICD-250.00) follows with endo for same - anticipated temp rise in glycemic control with acute illness and steroids Her updated medication list for this problem includes:    Onglyza 5 Mg Tabs (Saxagliptin hcl) .Marland Kitchen... 1 tablet daily    Metformin Hcl 500 Mg Xr24h-tab (Metformin hcl) .Marland Kitchen... 2 tabs each am  Labs Reviewed: Creat: 0.9 (05/29/2010)    Reviewed HgBA1c results: 7.7 (07/12/2010)  7.3 (05/29/2010)  Complete Medication List: 1)  Metoprolol Succinate 100 Mg Xr24h-tab (Metoprolol succinate) .Marland Kitchen.. 1 by mouth once daily 2)  Onglyza 5 Mg Tabs (Saxagliptin hcl) .Marland Kitchen.. 1 tablet daily 3)  Hydrochlorothiazide 25 Mg Tabs (Hydrochlorothiazide) .Marland Kitchen.. 1 tablet daily 4)  Estradiol 1 Mg Tabs (Estradiol) .Marland Kitchen.. 1 tablet daily 5)  Fish Oil 1000 Mg Caps (Omega-3 fatty acids) .Marland Kitchen.. 1 capsule once daily 6)  Multivitamins Caps (Multiple vitamin) .Marland Kitchen.. 1 capsule daily 7)  Pentasa 500 Mg Cr-caps (Mesalamine) .Marland Kitchen.. 1 tab by mouth two times a day 8)  Bromocriptine Mesylate  2.5 Mg Tabs (Bromocriptine mesylate) .Marland Kitchen.. 1 tab at bedtime 9)  Vitamin D 1000 Unit Tabs (Cholecalciferol) .... Take 1 by mouth once daily 10)  Chromium Picolinate 200 Mcg Tabs (Chromium picolinate) .... Take 1 po once daily 11)  Alprazolam 0.25 Mg Tabs (Alprazolam) .... Take 1 three times a day as needed 12)  Zegerid 40-1100 Mg Caps (Omeprazole-sodium bicarbonate) .Marland Kitchen.. 1 by mouth once daily 13)  Metformin Hcl 500 Mg Xr24h-tab (Metformin hcl) .... 2 tabs each am 14)  Azithromycin 250 Mg Tabs (Azithromycin) .... 2 tabs by mouth today, then 1 by mouth daily starting tomorrow 15)  Tussionex Pennkinetic Er 10-8 Mg/13m Lqcr (Hydrocod polst-chlorphen polst) .... 5 cc by mouth every 12h as needed for cough symptoms  Patient Instructions: 1)  it was good to see you today. 2)  Zpak antibiotics and Tussionex cough syrup - your prescriptions have been electronically submitted or faxed to your pharmacy. Please take as directed. Contact our office if you believe you're having problems with the medication(s).  3)  steroid shot given for wheeze and inflammation symptoms today - this medication will cause your sugars to go higher than usual for next 3-5 days - 4)  Get plenty of rest, drink lots of clear liquids, and use Tylenol or Ibuprofen for fever and comfort. Return in 7-10 days if you're not better:sooner if you're feeling worse. Prescriptions: TUSSIONEX PENNKINETIC ER 10-8 MG/5ML LQCR (HYDROCOD POLST-CHLORPHEN POLST) 5 cc by mouth every 12h as needed for cough symptoms  #100cc x 0   Entered and Authorized by:   VRowe ClackMD   Signed by:   VRowe ClackMD on 09/26/2010   Method used:   Printed then faxed to ...       [phone] (retail)       [address]  [address]      Ph: [phone]      Fax: [phone]  RxID:   [id number] 250 MG TABS (AZITHROMYCIN) 2 tabs by mouth today, then 1 by  mouth daily starting tomorrow  #6 x 0   Entered and Authorized by:   VRowe ClackMD   Signed by:   VRowe ClackMD on 09/26/2010   Method used:   Electronically to        [facility] [phone]* (retail)       [address]       [place],   [address]       Ph: [phone] or [phone]       Fax: [phone]   RxID:   [id number]    Medication Administration  Injection # 1:    Medication: Depo- Medrol 70m    Diagnosis: ACUTE BRONCHITIS (ICD-466.0)    Route: IM    Site: RUOQ gluteus    Exp Date: 03/2013    Lot #: OCassell Clement   Mfr: Pharm[employer]    Comments: Gave total of 1225m   Patient tolerated injection without complications    Given by: LuTomma LightningMA (September 26, 2010 11:49 AM)  Injection # 2:    Medication: Depo- Medrol 4011m  Diagnosis: ACUTE BRONCHITIS (ICD-466.0)  Orders Added: 1)  Depo- Medrol 29m8m1040] 2)  Depo- Medrol 40mg42m030] 3)  Admin of Therapeutic Inj  intramuscular or subcutaneous [96372] 4)  Est. Patient Level IV [9921[51700]Prescription Created Electronically [[phone]

## 2010-11-01 NOTE — Progress Notes (Signed)
Summary: Call Report  Phone Note Other Incoming   Caller: [facility] Summary of Call: [facility] Triage Call Report Triage Record Num: [id number] Operator: Noemi Chapel Patient Name: [name] Call Date & Time: 09/24/2010 11:28:14AM Patient Phone: [phone] PCP: [name] Patient Gender: Female PCP Fax : [phone] Patient DOB: April 29, 1946 Practice Name: [facility] Reason for Call: Caller reports waking in the middle of the night, 12/25 with burning/tickle in throat. Now has same with chest congestion and cough. Ear pain. Caller doesn't think she can wait until tomorrow to talk to someone; wants to see if something can be called in today. This am, some sob and rattling in chest when breathing-worse with deep breathes. She has taken a dose of Tylenol this am. Eating/drinking as usual. Up and about. BS has not been checked this am and no meds taken yet. Normally takes med around lunch time. Caller advised this RN can not call in any meds. Triage offered. Home care per Diabetic/Respitory Problems with cb parameters. Caller advised to Fu with MD in the am prn. Protocol(s) Used: Diabetes: Respiratory Problems Recommended Outcome per Protocol: Provide Home/Self Care Override Outcome if Used in Protocol: See Provider within 24 hours RN Reason for Override Outcome: Nursing Judgement Used. Reason for Outcome: Sore throat, cough or other upper respiratory infection (URI) symptoms with no temperature elevation Care Advice:  ~ Use a cool mist humidifier to moisten air. Be sure to clean according to manufacturer's instructions.  ~ Call provider if no improvement of symptoms in 24-48 hours. Most adults need to drink 6-10 eight-ounce glasses (1.2-2.0 liters) of fluids per day unless previously told to limit fluid intake for other medical reasons. Limit fluids that contain caffeine, sugar or alcohol. Urine will be a very light yellow color when you drink enough fluids.   ~ Analgesic/Antipyretic Advice - Acetaminophen: Consider acetaminophen as directed on label or by pharmacist/provider for pain or fever PRECAUTIONS: Initial call taken by: Crissie Sickles, [place],  September 25, 2010 11:13 AM  Follow-up for Phone Call        see phone note to/from pt this am Follow-up by: Rowe Clack MD,  September 25, 2010 12:22 PM

## 2010-11-01 NOTE — Progress Notes (Signed)
Summary: Rx req  Phone Note Call from Patient Call back at Home Phone [phone]   Caller: Patient Summary of Call: Pt called stating she was advised to increase her Bromocriptine to 2 tabs at bedtime at last OV but will need a new Rx with updated directions. Pt is also requesting a 39m tab to take 1 at bedtime rather that 2 of the 2.557m [facility] taken by: DaCrissie SicklesCMImmokalee October 18, 2010 9:18 AM  Follow-up for Phone Call        i works better when taken two times a day.  rx sent. Follow-up by: SeDonavan FoilD,  October 18, 2010 12:27 PM  Additional Follow-up for Phone Call Additional follow up Details #1::        Called pt but phone line busy, will try again later..LEllison Hughsrchie CMA  October 19, 2010 9:04 AM   pt home number still busy. DaCrissie SicklesCMA  October 19, 2010 10:20 AM   Pt advised of RX, pt states that Welchol is causing severe nausea and stomachache. Pt has UC and is concerned that medication can cause UC flare. Pt says she has been taking 2 tabs 3 times a day to see is this helps sxs and is has not. Pt is requesting an alternative medicaine, please advise. Additional Follow-up by: DaCrissie SicklesCMA,  October 19, 2010 11:45 AM   New Allergies: ! WELCHOL (COLESEVELAM HCL) Additional Follow-up for Phone Call Additional follow up Details #2::    stop welchol ret as scheduled Follow-up by: SeDonavan FoilD,  October 19, 2010 12:46 PM  Additional Follow-up for Phone Call Additional follow up Details #3:: Details for Additional Follow-up Action Taken: Pt advised via Home VM Additional Follow-up by: DaCrissie SicklesCMAlden October 19, 2010 12:58 PM  New/Updated Medications: BROMOCRIPTINE MESYLATE 2.5 MG TABS (BROMOCRIPTINE MESYLATE) 1 tab two times a day New Allergies: ! WELCHOL (COLESEVELAM HCL)Prescriptions: BROMOCRIPTINE MESYLATE 2.5 MG TABS (BROMOCRIPTINE MESYLATE) 1 tab two times a day   #60 x 11   Entered and Authorized by:   SeDonavan FoilD   Signed by:   SeDonavan FoilD on 10/18/2010   Method used:   [facility]ronically to        [phone]       [address]     [place]  [address]     Ph: [phone]     Fax: [phone] RxID:   [id number]

## 2010-11-01 NOTE — Assessment & Plan Note (Signed)
Summary: 3 MTH FU---STC   Vital Signs:  Patient profile:   65 year old female Height:      61 inches (154.94 cm) Weight:      183.50 pounds (83.41 kg) BMI:     34.80 O2 Sat:      96 % on Room air Temp:     98.4 degrees F (36.89 degrees C) oral Pulse rate:   68 / minute Pulse rhythm:   regular BP sitting:   122 / 82  (left arm) Cuff size:   large  Vitals Entered By: Rebeca Alert CMA Deborra Medina) (October 11, 2010 10:36 AM)  O2 Flow:  Room air CC: Follow up on DM/discuss Medications, reaction to Tussionex (itching)/aj Is Patient Diabetic? Yes Comments pt is no longer takign Tussionex cough syrup or Ladona Ridgel   Referring Provider:  nyland Primary Provider:  Rowe Clack MD  CC:  Follow up on DM/discuss Medications and reaction to Tussionex (itching)/aj.  History of Present Illness: she feels better in general since she had a recent uri.  she brings a record of her cbg's which i have reviewed today.  it varies from 111-231, with most in the low-100's.    Current Medications (verified): 1)  Metoprolol Succinate 100 Mg Xr24h-Tab (Metoprolol Succinate) .Marland Kitchen.. 1 By Mouth Once Daily 2)  Onglyza 5 Mg Tabs (Saxagliptin Hcl) .Marland Kitchen.. 1 Tablet Daily 3)  Hydrochlorothiazide 25 Mg Tabs (Hydrochlorothiazide) .Marland Kitchen.. 1 Tablet Daily 4)  Estradiol 1 Mg Tabs (Estradiol) .Marland Kitchen.. 1 Tablet Daily 5)  Fish Oil 1000 Mg Caps (Omega-3 Fatty Acids) .Marland Kitchen.. 1 Capsule Once Daily 6)  Multivitamins  Caps (Multiple Vitamin) .Marland Kitchen.. 1 Capsule Daily 7)  Pentasa 500 Mg Cr-Caps (Mesalamine) .Marland Kitchen.. 1 Tab By Mouth Two Times A Day 8)  Bromocriptine Mesylate 2.5 Mg Tabs (Bromocriptine Mesylate) .Marland Kitchen.. 1 Tab At Bedtime 9)  Vitamin D 1000 Unit Tabs (Cholecalciferol) .... Take 1 By Mouth Once Daily 10)  Chromium Picolinate 200 Mcg Tabs (Chromium Picolinate) .... Take 1 Po Once Daily 11)  Alprazolam 0.25 Mg Tabs (Alprazolam) .... Take 1 Three Times A Day As Needed 12)  Zegerid 40-1100 Mg Caps (Omeprazole-Sodium Bicarbonate) .Marland Kitchen.. 1  By Mouth Once Daily 13)  Metformin Hcl 500 Mg Xr24h-Tab (Metformin Hcl) .... 2 Tabs Each Am 14)  Tussionex Pennkinetic Er 10-8 Mg/73m Lqcr (Hydrocod Polst-Chlorphen Polst) .... 5 Cc By Mouth Every 12h As Needed For Cough Symptoms 15)  Tessalon Perles 100 Mg Caps (Benzonatate) ..Marland Kitchen. 1 By Mouth Three Times A Day X 72h, Then As Needed For Cough Symptoms  Allergies (verified): 1)  ! Novocain 2)  ! Sular (Nisoldipine) 3)  ! Percocet 4)  ! * Tussonex Cough Syrup  Past History:  Past Medical History: Last updated: 09/26/2010 HYPERTENSION MENOPAUSAL DISORDER  GERD  DIABETES MELLITUS, TYPE II  ULCERATIVE COLITIS  Md roster: [facility] - [facility]  Review of Systems  The patient denies weight loss and weight gain.    Physical Exam  General:  obese.  no distress  Pulses:  dorsalis pedis intact bilat.    Extremities:  1+ right pedal edema and 1+ left pedal edema.   no deformity.  no ulcer on the feet.  feet are of normal color and temp.   Neurologic:  sensation is intact to touch on the feet  Additional Exam:  Hemoglobin A1C       [H]  9.2 %  4.6-6.5    Impression & Recommendations:  Problem # 1:  DIABETES MELLITUS, TYPE II (ICD-250.00) needs increased rx  Medications Added to Medication List This Visit: 1)  Welchol 625 Mg Tabs (Colesevelam hcl) .... 6 tabs once daily  Other Orders: TLB-A1C / Hgb A1C (Glycohemoglobin) (83036-A1C) Est. Patient Level III (24818)  Patient Instructions: 1)  check your blood sugar 1-2 times a day.  vary the time of day when you check, between before the 3 meals, and at bedtime.  also check if you have symptoms of your blood sugar being too high or too low.  please keep a record of the readings and bring it to your next appointment here.  please call us sooner if you are having low blood sugar episodes. 2)  blood tests are being ordered for you today.  please call [phone] to hear your test results.   3)  Please schedule  a follow-up appointment in 3 months. 4)  (update: i left message on phone-tree:  add welchol 6x625 mg once daily) Prescriptions: WELCHOL 625 MG TABS (COLESEVELAM HCL) 6 tabs once daily  #180 x 11   Entered and Authorized by:   Donavan Foil MD   Signed by:   Donavan Foil MD on 10/11/2010   Method used:   Electronically to        [phone] (retail)       [address]       [place],   [address]       Ph: [phone] or [phone]       Fax: [phone]   RxID:   [id number]    Orders Added: 1)  TLB-A1C / Hgb A1C (Glycohemoglobin) [83036-A1C] 2)  Est. Patient Level III [70761]

## 2010-12-19 ENCOUNTER — Encounter: Payer: Self-pay | Admitting: *Deleted

## 2010-12-26 ENCOUNTER — Telehealth: Payer: Self-pay

## 2010-12-26 NOTE — Telephone Encounter (Signed)
Pt called stating that she left a message on triage this morning stating she experienced some weakness, and HA Monday (03/26) while with her sister. Pt says she checked her blood sugar, 158 (normal per pt) and her BP 153/90 (normal per pt) and was advised by her sister to go to the ER but declined, stating she did no think it was a big deal. Pt says she went out of town Tuesday and was not able to call but called today. Pt was upset that she did not receive a call ( I did not retrieve a call from this pt on triage) and requested advisement about what caused her "episode. I apologized to pt about not receiving a return call. Pt stated that her BP and BS have remained normal and she felt fine. I advised pt that since she felt okay she can have her appt moved from 04/26 to next available any MD. PT expressed concern over Insurance covering an earlier appt with [facility] and I advised her to check with Insurance company and call back to move appt. Pt did not want to call Insurance (did not say why) and I then transferred her to schedulers. I was then told by Malachy Mood that pt was not satisfied by advisement, saying that she wanted to know if she had a stroke. I consulted with Dr Asa Lente who stated that without clinical evaluation there she could not give a cause for sxs. I advised pt of this and explained to her that she can be seen in ER, UC or here with any MD first available but pt declined stating that since she felt "normal" she will keep appt scheduled for 04/26.

## 2011-01-01 ENCOUNTER — Ambulatory Visit: Payer: Self-pay | Admitting: Internal Medicine

## 2011-01-24 ENCOUNTER — Other Ambulatory Visit (INDEPENDENT_AMBULATORY_CARE_PROVIDER_SITE_OTHER): Payer: Medicare Other

## 2011-01-24 ENCOUNTER — Other Ambulatory Visit: Payer: Medicare Other

## 2011-01-24 ENCOUNTER — Encounter: Payer: Self-pay | Admitting: Internal Medicine

## 2011-01-24 ENCOUNTER — Telehealth: Payer: Self-pay | Admitting: Internal Medicine

## 2011-01-24 ENCOUNTER — Encounter: Payer: Self-pay | Admitting: Endocrinology

## 2011-01-24 ENCOUNTER — Other Ambulatory Visit (INDEPENDENT_AMBULATORY_CARE_PROVIDER_SITE_OTHER): Payer: Medicare Other | Admitting: Internal Medicine

## 2011-01-24 ENCOUNTER — Ambulatory Visit (INDEPENDENT_AMBULATORY_CARE_PROVIDER_SITE_OTHER): Payer: Medicare Other | Admitting: Endocrinology

## 2011-01-24 ENCOUNTER — Telehealth: Payer: Self-pay | Admitting: *Deleted

## 2011-01-24 ENCOUNTER — Ambulatory Visit (INDEPENDENT_AMBULATORY_CARE_PROVIDER_SITE_OTHER): Payer: Medicare Other | Admitting: Internal Medicine

## 2011-01-24 VITALS — BP 128/76 | HR 68 | Temp 97.4°F | Ht 61.0 in | Wt 178.0 lb

## 2011-01-24 DIAGNOSIS — E785 Hyperlipidemia, unspecified: Secondary | ICD-10-CM

## 2011-01-24 DIAGNOSIS — E119 Type 2 diabetes mellitus without complications: Secondary | ICD-10-CM

## 2011-01-24 DIAGNOSIS — I10 Essential (primary) hypertension: Secondary | ICD-10-CM

## 2011-01-24 DIAGNOSIS — R002 Palpitations: Secondary | ICD-10-CM

## 2011-01-24 DIAGNOSIS — G5790 Unspecified mononeuropathy of unspecified lower limb: Secondary | ICD-10-CM

## 2011-01-24 LAB — LIPID PANEL
Cholesterol: 175 mg/dL (ref 0–200)
Total CHOL/HDL Ratio: 4
Triglycerides: 538 mg/dL — ABNORMAL HIGH (ref 0.0–149.0)

## 2011-01-24 MED ORDER — METOPROLOL SUCCINATE ER 100 MG PO TB24: 100.0000 mg | ORAL_TABLET | Freq: Every day | ORAL | Status: DC

## 2011-01-24 MED ORDER — HYDROCHLOROTHIAZIDE 25 MG PO TABS: 25.0000 mg | ORAL_TABLET | Freq: Every day | ORAL | Status: DC

## 2011-01-24 MED ORDER — VENLAFAXINE HCL ER 75 MG PO CP24: 75.0000 mg | ORAL_CAPSULE | Freq: Every day | ORAL | Status: DC

## 2011-01-24 MED ORDER — GLUCOSE BLOOD VI STRP: ORAL_STRIP | Status: DC

## 2011-01-24 MED ORDER — ALPRAZOLAM 0.25 MG PO TABS: 0.2500 mg | ORAL_TABLET | Freq: Three times a day (TID) | ORAL | Status: DC | PRN

## 2011-01-24 MED ORDER — HYDROCHLOROTHIAZIDE 50 MG PO TABS: 50.0000 mg | ORAL_TABLET | Freq: Every day | ORAL | Status: DC

## 2011-01-24 NOTE — Telephone Encounter (Signed)
Updated EPIC, md sign printed rx gave to patient

## 2011-01-24 NOTE — Assessment & Plan Note (Signed)
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

## 2011-01-24 NOTE — Progress Notes (Signed)
  Subjective:    Patient ID: [name], female    DOB: 15-Mar-1946, 65 y.o.   MRN: [id number]  HPI Here for follow up   reviewed chronic med issues:  DM2 - follows with endo for same - dx 2010 - no known chronic complications.  she has never been on insulin.  she was unable to tolerate metformin (diarrhea), januvia (headache), byetta (abdominal bruising), glipizide and actos (edema).  she now takes onglyza, metformin and bromocrip. states cbg's are well-controlled. pt says her diet and exercise are good. Notes B feet burning sensation  HTN - reports compliance with ongoing medical treatment and no changes in medication dose or frequency. denies adverse side effects related to current therapy. would prefer to have generics for cost control if possible - chronic LE edema  Dyslipidemia - prev rx wth crestor, lipitor and zocor poorly tolerated due to myalgias - never tried lower dose or qod dosing - would try again if needed  GERD - reports compliance with ongoing medical treatment and no changes in medication dose or frequency. denies adverse side effects related to current therapy.  planning to try baking soda with omeprazole for cost savings in place of zegrid  Ulcerative colitis -quiet symptoms at this time - follows with GI for same - reports compliance with ongoing medical treatment and no changes in medication dose or frequency. denies adverse side effects related to current therapy.   CP - ongoing occ "fluttering", usually at night when still 2 episodes since completion of stress test 06/19/10 (normal) always occurs at rest - nonexertional, not positional Improved with prn lorazepam - requests refill   Past Medical History  Diagnosis Date  . MENOPAUSAL DISORDER   . SHOULDER PAIN, RIGHT 06/07/2010  . REDUCTION MAMMOPLASTY, HX OF 1994    bilateral  . ULCERATIVE COLITIS   . HYPERTENSION   . GERD   . DIABETES MELLITUS, TYPE II      Review of Systems  Constitutional: Negative for  fever.  Genitourinary: Negative for dysuria.  Neurological: Negative for syncope.       Objective:   Physical Exam  Constitutional: She appears well-developed and well-nourished.  Cardiovascular: Normal rate, regular rhythm and normal heart sounds.   Pulmonary/Chest: Effort normal and breath sounds normal. No respiratory distress.  Psychiatric: She has a normal mood and affect. Her behavior is normal. Thought content normal.   BP 128/76  Pulse 68  Temp(Src) 97.4 F (36.3 C) (Oral)  Ht 5' 1"  (1.549 m)  Wt 178 lb 12.8 oz (81.103 kg)  BMI 33.78 kg/m2  SpO2 97% Lab Results  Component Value Date   WBC 8.8 10/11/2010   HGB 15.1* 10/11/2010   HCT 43.4 10/11/2010   PLT 234.0 10/11/2010   CHOL 200 05/29/2010   TRIG 325.0* 05/29/2010   HDL 41.60 05/29/2010   LDLDIRECT 119.2 05/29/2010   ALT 27 10/11/2010   AST 27 10/11/2010   NA 139 10/11/2010   K 3.8 10/11/2010   CL 95* 10/11/2010   CREATININE 0.7 10/11/2010   BUN 17 10/11/2010   CO2 26 10/11/2010   TSH 1.12 10/11/2010   HGBA1C 7.8* 10/11/2010   MICROALBUR 1.1 05/29/2010       Assessment & Plan:  See problem list. Medications and labs reviewed today.

## 2011-01-24 NOTE — Patient Instructions (Addendum)
blood tests are being ordered for you today.  please call [phone] to hear your test results. pending the test results, please continue the same medications for now check your blood sugar 1 time a day.  vary the time of day when you check, between before the 3 meals, and at bedtime.  also check if you have symptoms of your blood sugar being too high or too low.  please keep a record of the readings and bring it to your next appointment here.  please call us sooner if you are having low blood sugar episodes. Please make a follow-up appointment in 3 months. Start venlafaxine 75 mg daily, with the evening meal. (update: i left message on phone-tree:  i advised changing onglyza to victoza).

## 2011-01-24 NOTE — Assessment & Plan Note (Signed)
The current medical regimen is effective;  continue present plan and medications. BP Readings from Last 3 Encounters:  01/24/11 128/76  10/11/10 122/82  10/11/10 122/82

## 2011-01-24 NOTE — Assessment & Plan Note (Signed)
Check lipids now - will plan atorva or crestor low dose or qod dosing if tx needed (esp given co-dx DM) Poor tol of simva and daily lipitor and crestor in past

## 2011-01-24 NOTE — Patient Instructions (Signed)
It was good to see you today. Medications reviewed, no changes at this time. Consider "amitriptyline" for your feet burning if ok with dr. Loanne Drilling - i will prescribe if needed Test(s) ordered today. Your results will be called to you after review (48-72hours after test completion). If any changes need to be made, you will be notified at that time. Refill on medication(s) as discussed today. Please schedule followup in 3-4 months, call sooner if problems.

## 2011-01-24 NOTE — Progress Notes (Signed)
  Subjective:    Patient ID: [name], female    DOB: 1945/12/20, 65 y.o.   MRN: [id number]  HPI Pt states few mos of moderate burning-quality pain at the feet, and assoc numbness. she brings a record of her cbg's which i have reviewed today.  It varies from 90-200.  It is in general higher pc than ac. Past Medical History  Diagnosis Date  . MENOPAUSAL DISORDER   . SHOULDER PAIN, RIGHT 06/07/2010  . REDUCTION MAMMOPLASTY, HX OF 1994    bilateral  . ULCERATIVE COLITIS   . HYPERTENSION   . GERD   . DIABETES MELLITUS, TYPE II    Past Surgical History  Procedure Date  . Abdominal hysterectomy 1980    total, with bladder tack  . Breast surgery 1994    Bilateral, due to fibrocystic breast changes  . Rectocele repair 1990  . Carpal tunnel release 1996    Right  . Right knee arthroscopy 01/1999    then again 10/2000  . L4-l5 diskectomy 08/1999  . Cholecystectomy     reports that she has never smoked. She does not have any smokeless tobacco history on file. She reports that she drinks alcohol. She reports that she does not use illicit drugs. family history includes Alcohol abuse in her other; Diabetes in her mother; and Heart attack in her father. Allergies  Allergen Reactions  . Colesevelam     REACTION: gi sxs  . Nisoldipine   . Oxycodone-Acetaminophen     REACTION: Itch  . Procaine Hcl     Review of Systems Edema persists.  No sob.    Objective:   Physical Exam GENERAL: no distress. Pulses: dorsalis pedis intact bilat.   Feet: no deformity.  no ulcer on the feet.  feet are of normal color and temp.  There is 1+ bilat leg edema Neuro: sensation is intact to touch on the feet.    Lab Results  Component Value Date   HGBA1C 7.4* 01/24/2011      Assessment & Plan:  Painful neuropathy, new problem Dm, needs increased rx, if it can be done without hypoglycemia.

## 2011-01-24 NOTE — Telephone Encounter (Signed)
Please call patient - lab results show cholesterol is improved - total at 175, previously 200 and LDL down to 70, previous 120 - both numbers at goal, but note triglycerides still high (>500). No medication changes recommended, continue the diet and exercise lifestyle control as ongoing and current meds. Thanks.

## 2011-01-25 ENCOUNTER — Telehealth: Payer: Self-pay

## 2011-01-25 MED ORDER — TRAZODONE HCL 100 MG PO TABS: 100.0000 mg | ORAL_TABLET | Freq: Every day | ORAL | Status: DC

## 2011-01-25 NOTE — Telephone Encounter (Signed)
Options: i can prescribe a starting dosage 1/2 as much.  sxs resolve with time. Different med.

## 2011-01-25 NOTE — Telephone Encounter (Signed)
i sent rx for trazodone.  It helps pain and sleep.  You should take it qhs

## 2011-01-25 NOTE — Telephone Encounter (Signed)
Pt called stating that generic Effexor caused severe nausea and insomnia- pt has not sleep yet. Please advise.

## 2011-01-25 NOTE — Telephone Encounter (Signed)
Pt advised of Rx and pharmacy

## 2011-01-25 NOTE — Telephone Encounter (Signed)
Pt Notified with lab results..01/25/11@2 :12pm/LMB

## 2011-01-25 NOTE — Telephone Encounter (Signed)
Pt states that she would prefer an different medications as side effects are too intense. Please advise

## 2011-01-31 ENCOUNTER — Telehealth: Payer: Self-pay | Admitting: Internal Medicine

## 2011-01-31 NOTE — Telephone Encounter (Signed)
PA requested for Omeprazole 65m Capsule [01/30/11] PA form requested from [facility]@ [phone][05/03]

## 2011-02-01 LAB — HM DIABETES FOOT EXAM

## 2011-02-04 ENCOUNTER — Telehealth: Payer: Self-pay

## 2011-02-04 MED ORDER — DULOXETINE HCL 30 MG PO CPEP: 30.0000 mg | ORAL_CAPSULE | Freq: Every day | ORAL | Status: DC

## 2011-02-04 NOTE — Telephone Encounter (Signed)
Pt called stating that Trazodone is not helping with burning/numbness in her feet, she also says "there are too many side effects (pt declined to elaborate). Pt is requesting and alternative medication, please advise.

## 2011-02-04 NOTE — Telephone Encounter (Signed)
Pt advised.

## 2011-02-04 NOTE — Telephone Encounter (Signed)
There are few options left.  i sent rx for cymbalta 30 mg qd

## 2011-02-05 ENCOUNTER — Other Ambulatory Visit: Payer: Self-pay | Admitting: *Deleted

## 2011-02-05 ENCOUNTER — Encounter: Payer: Self-pay | Admitting: Internal Medicine

## 2011-02-05 MED ORDER — METFORMIN HCL ER 500 MG PO TB24: 1000.0000 mg | ORAL_TABLET | Freq: Every day | ORAL | Status: DC

## 2011-02-05 MED ORDER — GLUCOSE BLOOD VI STRP: ORAL_STRIP | Status: DC

## 2011-02-05 NOTE — Telephone Encounter (Signed)
R'cd fax from [facility] for refill of pt's Metformin and One Touch Ultra Test strips

## 2011-02-06 NOTE — Telephone Encounter (Signed)
PA form recvd, completed, and faxed for Approval 02/01/11 Approval recvd & faxed to pharmacy [upon returning from [place] 02/05/11

## 2011-02-11 ENCOUNTER — Telehealth: Payer: Self-pay

## 2011-02-11 NOTE — Telephone Encounter (Signed)
Pt advised and will discuss possibility of alternate therapy at next OV

## 2011-02-11 NOTE — Telephone Encounter (Signed)
Pt called stating she developed fever, chills, nausea and vomiting after taking Cymbalta for 3 days. Pt states she stopped taking on the fourth day but continued with sxs for another 2 days and is now slowly feeling better. Although sxs lasted after she D/C's medication pt is adamant that [place] is the cause and is requesting alternative medication/advisement.

## 2011-02-11 NOTE — Telephone Encounter (Signed)
Please d/c cymbalta.  You should try to take no medication for now.

## 2011-02-14 ENCOUNTER — Encounter: Payer: Self-pay | Admitting: Internal Medicine

## 2011-02-15 NOTE — Op Note (Signed)
NAMECHEYLA, [name]                 ACCOUNT NO.:  [id number]   MEDICAL RECORD NO.:  [id number]          PATIENT TYPE:  AMB   LOCATION:  DAY                           FACILITY:  [facility]   PHYSICIAN:  [name], M.D.    DATE OF BIRTH:  [birth year]   DATE OF PROCEDURE:  12/02/2005  DATE OF DISCHARGE:                                 OPERATIVE REPORT   PROCEDURE:  Esophagogastroduodenoscopy with esophageal dilation.   INDICATION:  [name] is a 65 year old Caucasian female with chronic GERD who  presents with solid food dysphagia. She has history of esophageal ring which  was last dilated/disrupted in 1998. No ring was obvious on EGD of December  2003. She is undergoing diagnostic and therapeutic EGD. She thought she was  having side effects with Nexium and was switched to Zegerid. She states she  has not had any problems so far. Procedure and risks were reviewed with the  patient, and informed consent was obtained.   MEDICINES FOR CONSCIOUS SEDATION:  Tessalon Perles for oropharyngeal topical  anesthesia, Demerol 50 mg IV, Versed 8 mg IV.   FINDINGS:  Procedure performed in endoscopy suite. The patient's vital signs  and O2 saturation were monitored during the procedure and remained stable.  The patient was placed in left lateral position. Olympus videoscope was  passed via oropharynx without any difficulty into esophagus.   Esophagus. Mucosa of the esophagus was normal throughout. GE junction was  wavy, located at 34 cm from the incisors, and hiatus was 36. No ring or  stricture was noted.   Stomach. It was empty and distended very well with insufflation. Folds of  proximal stomach were normal. Examination of mucosa revealed antral erythema  without erosions or ulcers. Pyloric channel was patent. Angularis, fundus  and cardia were examined by retroflexing the scope and were normal.   Duodenum. Bulbar mucosa was normal. Scope was passed into the second part of  the duodenum where  mucosa and folds were normal. Endoscope was withdrawn.   Esophagus was dilated by passing 56-French Maloney dilator to full  insertion. As the dilator was withdrawn, endoscope was passed again and  esophagus reexamined, and there was no mucosal injury noted. Endoscope was  withdrawn. The patient tolerated the procedure well.   FINAL DIAGNOSIS:  Small sliding hiatal hernia. Otherwise normal examination  without ring or stricture formation. Esophagus dilated by passing 56-French  Maloney dilator given history of dysphagia.   Antral erythema. Please note her H pylori serology in past has been  negative.   RECOMMENDATIONS:  She will continue anti-reflux measures and Zegerid at 40  mg p.o. q.a.m.; prescription given for 30 with 11 refills. She will pick up  coupon from the office to help with copay. If dysphagia persists, will  consider barium pill study.      [name], M.D.  Electronically Signed     NR/MEDQ  D:  12/02/2005  T:  12/03/2005  Job:  [id number]   cc:   Margarita Rana, M.D.  Fax: [phone]

## 2011-02-15 NOTE — Op Note (Signed)
NAME:  [name], [name]                           ACCOUNT NO.:  [id number]   MEDICAL RECORD NO.:  [id number]                   PATIENT TYPE:  AMB   LOCATION:  [place]                                  FACILITY:  [facility]   PHYSICIAN:  Youlanda Mighty. Luisa Dago., M.D.          DATE OF BIRTH:  [birth year]   DATE OF PROCEDURE:  03/21/2003  DATE OF DISCHARGE:                                 OPERATIVE REPORT   PREOPERATIVE DIAGNOSES:  1. Enlarging mass, dorsal aspect of left thumb-index  2. Left carpal tunnel syndrome.   POSTOPERATIVE DIAGNOSES:  1. Enlarging mass, dorsal aspect of left thumb-index  2. Left carpal tunnel syndrome.   OPERATION PERFORMED:  1. 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  2. Through separate incision, left carpal tunnel release.   SURGEON:  Youlanda Mighty. Sypher, M.D.   ASSISTANT:  Julian Reil, P.A.   ANESTHESIA:  General by LMA.   SUPERVISING ANESTHESIOLOGIST:  Jessy Oto. Albertina Parr, M.D.   INDICATIONS FOR PROCEDURE:  [name] was referred for evaluation and  management of a numb left hand with a mass on the dorsal aspect of her thumb-  index webspace adjacent to the radial artery.  Clinical examination in the  office suggested a probable ganglion and carpal tunnel syndrome.  Electrodiagnostic studies confirmed median neuropathy at the level of the  left wrist.  After informed consent she is brought to the operating room at  this time for release of her left transverse carpal ligament and excision of  her mass.   DESCRIPTION OF PROCEDURE:  [name] was brought to the operating room and  placed in supine position upon the operating table.  Following induction of  general anesthesia by LMA, the left arm was prepped with Betadine soap and  solution and sterilely draped.  Following exsanguination of the limb with an  Esmarch bandage, an arterial tourniquet was inflated to 220 mmHg.  The  procedure commenced with a short longitudinal  incision directly over the  dorsal mass.  Subcutaneous tissues were carefully divided taking care to  identify the margins of the mass.  The mass was subfascial.  The fascia was  released and circumferentially dissected with a Valora Corporal.  This did not  directly involve the radial artery.  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  After  circumferential dissection, this was excised with electrocautery with small  margin.  The wound was then irrigated and repaired with intradermal 3-0  Prolene and a Steri-Strips.   Attention was then directed to the palm.  A short incision was fashioned in  line with the ring finger in the palm.  Subcutaneous tissues are carefully  divided revealing the palmar fascia.  An anomalous muscle was noted to be  exiting the carpal canal.  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  This was carefully isolated from the  transverse carpal ligament.  The ligament was released on its ulnar border  with scissors extending to the distal forearm.  This widely opened the  carpal canal.  The muscle was carefully inspected and elevated with a right  angle retractor.  The fascia deep to the muscle was released.  The muscle  was left in situ to protect the median nerve and common sensory branches of  the median nerve.  There appeared to be no indications for excision of the  muscle.  Decompression of the median nerve was assured.  The wound was then  irrigated and repaired with intradermal 3-0 Prolene and Steri-Strips.  A  compressive dressing was applied with a volar plaster splint maintaining the  wrist in five degrees dorsiflexion.                                                 Youlanda Mighty Luisa Dago., M.D.    RVS/MEDQ  D:  03/21/2003  T:  03/22/2003  Job:  [id number]

## 2011-02-15 NOTE — Op Note (Signed)
NAME:  [name], [name]                           ACCOUNT NO.:  [id number]   MEDICAL RECORD NO.:  [id number]                   PATIENT TYPE:  AMB   LOCATION:  DAY                                  FACILITY:  [facility]   PHYSICIAN:  [name], M.D.                 DATE OF BIRTH:  September 29, 1946   DATE OF PROCEDURE:  DATE OF DISCHARGE:                                 OPERATIVE REPORT   PROCEDURE:  Esophagogastroduodenoscopy followed by surveillance colonoscopy.   ENDOSCOPIST:  [name], M.D.   INDICATIONS:  This patient is a 65 year old Caucasian female with chronic  GERD who is maintained on Prilosec 20 mg b.i.d.  She continues to have vague  chest pain as well as epigastric and right upper quadrant pain.  She has had  cholecystectomy.  Ultrasound has revealed normal size bowel duct.  Transaminase has been mildly elevated and intermittently felt to be  steatosis.  She has UC of over 20 years duration.  Last surveillance exam  was in May 1998.  She is presently on Asacol and doing well.   Both of the procedures were reviewed with the patient and informed consent  was obtained.   PREOPERATIVE MEDICATIONS:  Lewayne Bunting for oropharyngeal topical  anesthesia, Demerol 50 mg IV and Versed 7 mg IV in divided dose.   INSTRUMENT:  Olympus video system.   FINDINGS:  Procedure performed in endoscopy suite.  The patient's vital  signs and O2 saturation were monitored during the procedure and remained  stable.   PROCEDURE #1: ESOPHAGOGASTRODUODENOSCOPY:  The patient was placed in the  left lateral recumbent position and the endoscope was passed via the  oropharynx without any difficulty into the esophagus.   ESOPHAGUS:  The mucosa of the esophagus was normal throughout.  Squamocolumnar junction was wavy but there was no stricture, ring or  erosions.  There was a small sliding hiatal hernia.   STOMACH:  It was empty and distended very well with insufflation.  The folds  of the proximal  stomach were normal.  Examination of the mucosa revealed  long linear streaks of erythematous mucosa at antrum, along with multiple  erosions, but no ulcer crater was found.  The pyloric channel was patent.  Angularis and fundus were examined by retroflexing the scope and were  normal.   DUODENUM:  Examination of the bulb and second part of the duodenum was  normal.   Endoscope was withdrawn and the patient was prepared for procedure #2.   TOTAL COLONOSCOPY:  Rectal examination was performed.  No abnormality noted  on the external or digital exam.   The scope was placed in the rectum and advanced under vision into the  sigmoid colon and beyond.  Preparation was satisfactory.  Scope was passed  into the cecum which was identified by ileocecal valve and appendiceal  orifice.  Pictures were taken for the record.  As  the scope was withdrawn,  colonic mucosa was once again carefully examined and multiple biopsies were  taken from the right colon, transverse colon, descending, and sigmoid colon,  and finally from the rectum.  The rectal mucosa was also normal.  The scope  was retroflexed to examine the anorectal junction and there was a scar in  the anorectal area and small hemorrhoids below the dentate line.   The endoscope was straightened and withdrawn.  The patient tolerated the  procedure well.   FINAL DIAGNOSES:  1. Small sliding hiatal hernia. No endoscopic evidence of reflux     esophagitis.  2. Erosive antral gastritis.  3. Ulcerative colitis remains in remission.  Multiple random biopsies taken     looking for dysplasia.  4. Small external hemorrhoids.   RECOMMENDATIONS:  H. pylori serology will be checked today.  For now, she  will stay on Prilosec at 20 mg b.i.d. and Asacol at 800 mg b.i.d.  I will be  contacting the patient with results of blood tests and biopsies and further  recommendations.                                               [name], M.D.     NR/MEDQ  D:  09/29/2002  T:  09/29/2002  Job:  [id number]   cc:   Trellis Moment  [address]  [place]  [place] [address]  Fax: [phone]

## 2011-02-15 NOTE — Assessment & Plan Note (Signed)
[name] is a 65 year old white female who is a patient of Dr. Harley Hallmark.  She is being seen in our pain and rehabilitative clinic for low back and  right leg pain.  At the last visit she was noted to have tender trochanters,  felt to have a trochanteric bursitis.  She has been scheduled today for a  trochanteric bursitis injection into the right hip.  Her average pain is  about a 5 on a scale of 10.  It can go up to a 7.  The hip has been  bothering her, especially when she goes from a sit to a stand or walks for  any length.  She is unable to sleep on that right side.  She has quite a bit  of tenderness with pressure over the area.  No new changes in her medical  history since last visit.  She denies any new numbness, tingling, weakness,  or bowel or bladder problems.  Denies any suicidal ideation.  Has been  tolerating Celexa, Biofreeze, and Elavil at night which does help her with  sleep.  Overall mood is better since she has been on Celexa.   PHYSICAL EXAMINATION:  VITAL SIGNS:  Her blood pressure is 136/70,  respirations 20, pulse 80, 96% saturated on room air.  GENERAL:  She is alert, oriented, cooperative.  MUSCULOSKELETAL:  Gait is slightly antalgic when she first gets out of the  chair.  Reflexes are 2+ at the knees and ankles.  Toes are downgoing.  No  clonus.  She has good strength in the lower extremities 5/5 at hip flexors,  knee extensors, dorsiflexors, plantar flexors, EHL as well as knee flexors.  Romberg test is negative.  Sensation is intact except for over the right toe  and dorsal foot.  With palpation over the right trochanter, she continues to  be exquisitely tender.   IMPRESSION:  1.  Lumbago.  2.  Lumbar stenosis.  3.  L4-L5 hemilaminectomy in 2000, by Dr. Joya Salm.  4.  Right knee osteoarthritis.  5.  History of anxiety and depression currently on Celexa.  6.  Lower extremity dysesthesias.  7.  Right trochanteric bursitis.   PROCEDURE:  The risks and  benefits regarding a trochanteric bursitis  injection with Celestone and lidocaine were reviewed with her.  Alternatives  were also discussed.  She wishes to proceed.  A consent for right  trochanteric bursitis injection was signed.  The risks including bleeding,  infection, bruising, increased pain, possible nerve injury.  The skin over  the right trochanter was prepped with alcohol and Betadine.  Using a 22  gauge needle 2 cc of Celestone and 3 cc of 1% lidocaine were injected into  the area.  The patient tolerated the procedure well.  No complications.  The  patient had an appropriate recovery, noted improvement in her hip pain  immediately.  She was discharged in good condition.  No complications were  identified.  There were no barriers to communication perceived today.   PLAN:  1.  She was also given prescriptions for Elavil 10 mg, 1-2 p.o. q.h.s.      p.r.n., #60.  2.  Biofreeze 32 ounces to use as directed, one bottle.  3.  Celexa was refilled 20 mg one p.o. every day, #30.  4.  We will see her back in one month.  5.  Instructions were given to ice the right trochanteric area over the next      couple of days and let  us know if there is any further problems.       DMK/MedQ  D:  09/12/2004 17:37:58  T:  09/12/2004 18:28:11  Job #:  [id number]

## 2011-02-15 NOTE — Consult Note (Signed)
[facility]  Patient:    [name], [name]                        MRN: [id number] Proc. Date: 02/10/01 Adm. Date:  [id number] Attending:  Suezanne Cheshire CC:         Caswell Corwin, R.N.   Consultation Report  REASON FOR CONSULTATION:  A 65 year old white female seen at the request of Dr. Jolyn Nap for evaluation of a newly diagnosed pelvic mass.  HISTORY OF PRESENT ILLNESS:  The patient is a 65 year old white married female who recently was found to have a pelvic mass.  She is relatively asymptomatic, although she does have some pelvic pressure and slight discomfort.  The patient has a past medical history of an abdominal hysterectomy in 1982 for abnormal uterine bleeding.  She recently has developed menopausal symptoms with hot flashes, insomnia, weight gain, a decreased libido, and some emotional changes.  The patient has seen Dr. Tamala Julian on April 30.  Prior to that visit, she had had an ultrasound of the pelvis which showed a 9.1 x 6.3 x 5.7 cm right adnexal mass, which was complex with several large cystic areas and hypoechoic nodules with thick and thin septations.  A simple cyst is seen in the left adnexa measuring 2.4 cm.  The patient is status post a hysterectomy.  No free fluid is noted in the pelvis and there is no evidence of adenopathy.  PAST MEDICAL HISTORY:  Medical illness:  Hypertension, "fatty liver," gastroesophageal reflux disease.  The patients last mammogram was approximately a year ago.  SOCIAL HISTORY:  The patient does not smoke.  She drinks on occasion.  She does not take calcium supplementation.  She is a Corporate treasurer for [employer].  REVIEW OF SYSTEMS:  Negative except as noted above.  CURRENT MEDICATIONS: 1. Toprol 50 mg daily. 2. Asacol. 3. Hydrochlorothiazide 50 mg daily. 4. Nexium.  PHYSICAL EXAMINATION:  VITAL SIGNS:  Weight 178 pounds, height 5 feet 1-3/4, blood pressure 148/88.  GENERAL:  The  patient is a healthy white female in no acute distress.  HEENT:  Negative.  NECK:  Supple without thyromegaly.  ABDOMEN:  Obese, soft and nontender.  No masses, organomegaly, ascites, or hernias are noted.  EXTREMITIES:  Lower extremities are without edema or varicosities.  PELVIC:  Exam is deferred to the operating room.  IMPRESSION: 1. Complex right pelvic mass in a patient with a prior hysterectomy.  CA 125    values reported as normal. 2. Fatty liver with mild elevation of liver function tests.  The patient    denies any past history of hepatitis.  PLAN:  I am in agreement with Dr. Darliss Ridgel recommendation for the patient to undergo exploratory laparotomy, bilateral salpingo-oophorectomy, and intraoperative frozen section.  Should she be found to have an ovarian cancer, additional procedures including omentectomy, multiple peritoneal biopsies, pelvic and periaortic lymph node dissection would be carried out for accurate staging.  I have discussed with the patient the extent of surgery recommended should she be found to have ovarian cancer, as well as concepts of ovarian cancer debulking which might include bowel resection with anastomosis.  The patient has undergone a mechanical bowel preparation and will receive intravenous antibiotics perioperatively.  All of the patients questions are answered. She wishes to proceed with surgery later this morning.  Risks of surgery including hemorrhage, infection, injury to adjacent viscera, and thromboembolic complications are outlined to the patient.  Her  questions are answered. DD:  02/10/01 TD:  02/10/01 Job: [id number]

## 2011-02-15 NOTE — Discharge Summary (Signed)
[facility]  Patient:    [name]                         MRN: [id number] Adm. Date:  [id number] Disc. Date: 09/09/99 Attending:  Minda Meo                           Discharge Summary  For full details of this admission please refer to the typed history and physical.  BRIEF HISTORY:  The patient is a 65 year old white female who suffers from back and leg pain.  She was worked up as an outpatient and diagnosis of L4-5 bulging disk and arthropathy with no foraminal stenosis.  She failed medical management and therefore weighed the risks, benefits and alternatives of surgery and decided to proceed with surgery.  For past medical history, past surgical history, medications prior to admission, drug allergies, family medical history, social history, admission physical examination, imaging assessment, assessment, plan, etc., please refer to typed history and physical.  HOSPITAL COURSE:  The patient was admitted by Dr. Joya Salm with diagnosis of L4-5  bulging disk, spinal stenosis.  On the day of admission Dr. Joya Salm performed a bilateral L4 and L5 laminotomy foraminotomy.  The surgery went well without complications.  (For full details of this operation please refer to typed operative note.)  POSTOPERATIVE COURSE:  The patients postoperative course was essentially unremarkable.  She was somewhat slow to mobilize but by postoperative day #2, i.e., September 09, 1999, she was eating well, ambulating well.  She was afebrile, vital signs stable, wound was healing well without signs of infection.  She was requesting discharge to home and she was therefore discharged to home on September 09, 1999.  DISCHARGE INSTRUCTIONS:  The patient was given written discharge instructions by Dr. Joya Salm.  FINAL DIAGNOSES: 1. L4-5 bulging disk. 2. Spinal stenosis.  PROCEDURE PERFORMED:  Bilateral L4 and L5 hemilaminectomies,  foraminotomies.  DISCHARGE MEDICATIONS:  Mepergan Fortis #60 one or two p.o. q.4h. p.r.n. for pain. DD:  09/09/99 TD:  09/09/99 Job: [id number]

## 2011-02-15 NOTE — Assessment & Plan Note (Signed)
HISTORY OF PRESENT ILLNESS:  [name] is a 65 year old woman with a history  of ulcerative colitis.  He is being seen in our clinic for management of low  back pain secondary to lumbar stenosis and is status post a L4/5  hemilaminectomy in the year 2000 by Dr. Leeroy Cha.  She also has a  history of some right degenerative joint disease and some anxiety.   She is back in today for recheck.  She has been very compliant in continuing  her pool program.  She is going twice a week.  She would love to go more  than twice a week.  She feels it helps her pain quite a bit and keeps her  functional and her weight down.  She reports she has been much better at  pacing her activities and has not been doing too much at a time.   She was started on Celexa at the last visit.  Overall, she feels much  better.  She says that she does not feel like she is going to bite other  people's heads off anymore.   On her pain scale, her average pain is about a two.  Worse it has been in  the last 24 hours is about a four.  Her pain is typically located in the low  back, radiates down the right leg to approximately the knee, worse with  activities, better with rest.  No new bowel or bladder problems.  No new  numbness, tingling, or weakness.   PHYSICAL EXAMINATION:  VITAL SIGNS:  Her blood pressure is 153/82, pulse 88,  respirations 14.  Saturating 98% on room air.  GENERAL:  She is appropriate, cooperative, well-developed, well-nourished in  no apparent distress.  NEUROLOGICAL:  She is able to get out of the chair easily.  Gait in the room  is normal.  She has limitation in forward flexion, extension, and lateral  flexion.  Seated reflexes are 2 to 3+ at the knees, 2 to 2+ at the ankles.  Toes are downgoing.  I cannot elicit any clonus.  She has 5/5 strength at  the hip flexors, knee extensors, dorsiflexors, plantar flexors, and EHL.  Straight leg raise is negative.   IMPRESSION:  1. Lumbago.  2.  Lumbar spondylosis.  3. Status post lumbar vertebrae-4/5 hemilaminectomy 2000 by Dr. Leeroy Cha.  4. Bilateral lower extremity dysaesthesia, much improved.  5. Right knee degenerative joint disease, stable.  6. Anxiety, improved on Celexa.   PLAN:  We will continue Lidoderm and Biofreeze.  The Elavil was  discontinued.  She is having some trouble with sleep at night.  We will try  her on desipramine 10 mg q.h.s.  If this does not work, may try Imipramine  or Flexeril at night.  We will continue Celexa 20 mg q.d.  We will have her  continue her pool therapy twice a week.  Would recommend she have an  inground pool built so she could continue do exercises daily which will help  with her maintaining her weight as well as her activity level.  Also  encourage her to pace her activity.   DISCHARGE MEDICATIONS:  1. Biofreeze use as directed.  2. Desipramine 10 mg 1 p.o. q.h.s. p.r.n. #30.  3. Celexa 20 mg 1 p.o. q.d. #30.  4. Also recommend home pool for daily pool program.  I feel this is     medically necessary to maintain her weight, continue her activity level,  and decrease her low back pain.   FOLLOW UP:  We will see her back again in one month for recheck.      Franchot Gallo, M.D.   DMK/MedQ  D:  04/11/2004 14:50:44  T:  04/11/2004 17:04:43  Job #:  [id number]   cc:   Leeroy Cha, M.D.  [address]  [place], [place] [address]  Fax: [phone]

## 2011-02-15 NOTE — H&P (Signed)
[facility]  Patient:    [name], [name]                        MRN: [id number] Adm. Date:  [id number] Attending:  Suezanne Cheshire                         History and Physical  REASON FOR ADMISSION:  [name] is a 65 year old female with a large right adnexal mass admitted for exploratory laparotomy and staging as indicated.  HISTORY OF PRESENT ILLNESS:  [name] is a 65 year old menopausal female, status post total abdominal hysterectomy many years ago for a benign process who was additionally seen in November 2001, for evaluation of menopausal symptoms.  At that time, she was on low-dose birth control pills with worsening of her symptoms.  Examination at that time was benign, but compromised by her exogenous obesity.  She was switched to oral hormonal replacement therapy and had some early improvement in her symptoms.  She was subsequently switched from Estratest to Cenestin and continued to have a few menopausal type symptoms and was noted to have an Estradiol of 70.  In the interim, she was evaluated by her primary care physician who found some mildly elevated liver enzymes.  In the course of that evaluation, she was felt to have a fatty liver on abdominal ultrasound.  However, she was found to have an incidental ovarian mass noted on that ultrasound with a subsequent transvaginal ultrasound revealing a 9 cm complex cystic mass involving the right ovary with a small simple cyst involving the left ovary.  The patient was seen in the office for evaluation and was indeed noted to have a new mass on palpation.  She had a normal CA 125 of 10.1.  She is admitted now to undergo exploratory laparotomy with BSO and staging procedures as indicated. Dr. Marti Sleigh will be a co-surgeon in the event that staging is necessary.  The patient has been extensively counseled to the risks, benefits, alternatives and complications of the procedure and  agrees to proceed.  She has undergone a mechanical bowel prep.  She is admitted now for same day surgery.  PAST MEDICAL HISTORY: 1. Essential hypertension. 2. Distant history of ulcerative colitis with no active disease.  PAST SURGICAL HISTORY: 1. In 1980, total abdominal hysterectomy with bladder tack. 2. In 1994, bilateral breast reduction due to fibrocystic breast changes. 3. In 1990, rectocele repair. 4. In 1996, right carpal tunnel release. 5. In May 2000, right knee arthroscopy repair. 6. In December 2000, L4-L5 diskectomy,. 7. In February 2002, right knee arthroscopic repair.  PAST OBSTETRIC HISTORY:  Vaginal delivery x 1 without complications.  PAST GYNECOLOGIC HISTORY:  As noted above.  Normal Pap smear in April 2001. Normal mammogram in April 2001.  ALLERGIES:  NOVOCAINE reportedly causes throat swelling.  PERCOCET causes a rash.  No latex sensitivities or drug sensitivities.  MEDICATIONS: 1. Asacol three tablets q.a.m. and q.h.s. 2. Nexium one p.o. q.d. 3. Cenestin 1.25 mg q.d. 4. Vitamins E, B and C. 5. Altace 10 mg daily. 6. Lasix 40 mg daily.  TRANSFUSION HISTORY:  Negative.  FAMILY HISTORY:  Positive for diabetes in her mother.  Heart disease in maternal grandparents.  Hypertension in her mother.  Father with ulcerative colitis.  SOCIAL HISTORY:  The patient has been married for 34 years.  She has one grown son.  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  She denies any alcohol, illicit drug use or smoking.  REVIEW OF SYSTEMS:  Notable for the symptoms related to this mass. Specifically, she reports no flare in her ulcerative colitis symptoms.  She has no urinary complaints.  She has marked improvement in her vasomotor symptoms.  She denies cardiovascular, respiratory or neurologic complaints.  PHYSICAL EXAMINATION:  GENERAL:  This is an obese, white female in no apparent distress.  VITAL SIGNS:  Height 5 feet 2 inches,  weight 182 pounds.  Blood pressure 156/98, pulse 76, respiratory rate 20, temperature 97.8.  HEENT:  Grossly negative.  Oropharynx clear.  NECK:  Supple without thyromegaly or lymphadenopathy.  CHEST:  Without spine or CVA tenderness.  LUNGS:  Clear to auscultation.  HEART:  Regular rate and rhythm without murmurs, rubs or gallops.  Carotids +2 and equal without bruit.  Distal pulses full.  BREASTS:  Status post reduction with no obvious mass.  ABDOMEN:  Obese without hepatosplenomegaly or obvious mass.  Well-healed low transverse scar.  PELVIC:  Good vaginal support.  Cuff intact.  A 10 cm, poorly mobile, cystic mass noted at the vaginal apex.  Rectovaginal confirmatory.  EXTREMITIES:  Without edema.  Decreased range of motion in right knee.  The patient is currently using a cane, status post her recent arthroscopy.  NEUROLOGIC:  Grossly nonfocal.  LABORATORY DATA AND X-RAY FINDINGS:  Normal CA 125.  Negative urinalysis. Comprehensive metabolic profile notable for a slightly decreased potassium of 3.2, slightly elevated OT and PT at 50 and 29 and a decreased Alk phos at 36. Hemoglobin 13.7, hematocrit 39.0, white count 5.5, platelets 256.  Coagulation studies normal.  Chest x-ray deferred per anesthesia.  EKG read as poor R wave progression.  IMPRESSION: 1. 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 2. Status post abdominal hysterectomy in the distant past. 3. Exogenous obesity. 4. Essential hypertension. 5. Status post multiple joint surgeries.  PLAN:  The patient was admitted for same day surgery.  She will undergo a mechanical bowel prep and antibiotic prophylaxis perioperatively.  She will undergo exploratory laparotomy with BSO and further staging as indicated.  She has been extensively counseled as to the risk, benefits, alternatives, complications and recovery expectations and agrees to proceed.DD:  02/10/01 TD:  02/10/01 Job: [id number]

## 2011-02-15 NOTE — Assessment & Plan Note (Signed)
FOLLOW UP:  [name] is a 65 year old married white female who is  back in today for a recheck and refill of her medications. She is a patient  of Dr. Leeroy Cha. She has chronic low back pain and has had some right  lateral hip pain as well.   She did well with an injection into the trochanteric bursitis area. The pain  has somewhat recurred. She has been unable to participate in therapy program  yet for some lower extremity flexibility on ultrasound. She has continued to  take Celexa, Elavil, and uses Biofreeze liberally. She is tolerating all of  these medications. She gets fair relief with these medicines. Her average  pain is about a 4 on a scale of 10. The pain is worse with walking and  standing. She does report she can walk a bit further if she is bent over a  shopping cart. There appears to be an element of stenosis.   Pain improves with rest, heat, ice, pacing, activities, or medications. She  can walk about 15 minutes at a time. She is able to climb stairs and she  does drive yet.   Last employed on Feb 26, 2002. Disabled June 02, 2002. She requires some  assistance with household duties and shopping but is independent with his  self care otherwise. She does report some intermittent numbness and tingling  in the lower extremities. Sometimes has trouble walking and some lower  extremity spasms.   No new changes are noted in her past medical, social, and family history.   PHYSICAL EXAMINATION:  VITAL SIGNS:  Blood pressure is 132/81, pulse 80,  respirations 16. Saturating 99% on room air.  GENERAL:  She is a moderately obese white female who does not appear in any  distress during our interview. She is cooperative. Affect is overall bright  and alert. She is oriented x 3.  MUSCULOSKELETAL:  She is able to independently stand from a seated position.  Her gait is normal. She has some limitations in forward flexion, extension,  lateral flexion mildly so. Her  lower extremity reflexes are symmetric and  intact. Her lower extremity tone is normal. She has excellent strength at  5/5 at hip flexors, knee extensors, dorsiflexors, plantar flexors, EHL.   IMPRESSION:  1.  Lumbago.  2.  Lumbar stenosis.  3.  Lumbar vertebrae-4/5 hemilaminectomy in 2000 by Dr. Leeroy Cha.  4.  History of some right knee osteoarthritis.  5.  Right trochanteric bursitis.  6.  History of anxiety/depression.   PLAN:  The patient has not participated in the therapy program yet. We will  go ahead and rewrite for this at this time. Would like to do some ultrasound  over the iliotibial band. We will refill her Celexa 20 mg 1 p.o. q.d. #30  and we will refill her Elavil at 10 mg 1-2 p.o. q.h.s. #60. We will see her  back in a month. I think her right hip pain varies at times. It does seem  she has more stenosis symptoms and other times she seems to have more of a  bursitis.  Her pain does vary somewhat. We will continue to monitor over the next  several months. I think therapy will be of some benefit to her and she is  going to be starting to participate in an exercise program with a pool.      DMK/MedQ  D:  11/14/2004 17:31:37  T:  11/14/2004 22:54:39  Job #:  [id number]  cc:   Leeroy Cha, M.D.  [address]  [place], [place] [address]  Fax: [phone]

## 2011-02-15 NOTE — Assessment & Plan Note (Signed)
MEDICAL RECORD NUMBER:  [id number].   DATE OF BIRTH:  October 02, 1945.   [name] is a 65 year old white female who is being seen in our pain and  rehabilitative clinic for low back pain and right leg pain.   She has been fairly well managed with Biofreeze and Celexa.   We recently have tried Neurontin, as well as Topamax.  She was unable to  tolerate either of those.  Also tried imipramine with her.  She did not  tolerate that well either.   Overall she reports her back is about 3 on a scale of 10.  The right leg is  also about a 2-3 on a scale of 10.   Her pain can increase quite a bit if she is quite active.  Her pain is  fairly well tied to activity levels.  It is worse with walking, bending,  prolonged sitting and working-type activities.   The patient denies any new problems with bladder or bowel incontinence.  Denies any new numbness tingling, weakness or gait changes.   Her function has been stable.  She in independent with ADLs, feeding,  dressing, bathing, oral and facial hygiene and meal prep.  She takes care of  a 40-year-old grand-daughter occasionally.  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  She is able to drive.  She enjoys  reading.  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  She also enjoys  sewing.   Her sleep overall has been fair.  She has had some problems with the  Neurontin causing some nightmares.  We will go ahead and discontinue.   Denies any suicidal ideation.  Denies any changes in medical history.   Current medications include Altace, Asacol, hydrochlorothiazide, Prilosec,  Celexa, Lidoderm p.r.n., Biofreeze, Elavil and Celexa.   PHYSICAL EXAMINATION:  A mildly obese white female in no distress.  She is  alert, oriented and bright in her affect.  Her blood pressure is 157/89,  pulse 89, respirations 20 and 97% on room air.  She is able to come out of  the chair without any difficulty.  Her gait is not  antalgic in the room.  She has some limitations with forward flexion, extension and lateral  flexion.  Seated reflexes are 3+ at the knees and 2+ at the ankles.  Toes  are downgoing.  No clonus is noted.  She has excellent strength in the lower  extremities, 5/5 at hip flexors, knee extensors, dorsiflexors, plantar  flexors and EHL, as well as knee flexors.  Coordination is grossly intact.  Sensation is intact, except for some numbness over the right toe and dorsal  foot.   IMPRESSION:  1.  Lumbago.  2.  Lumbar stenosis.  3.  L4-5 hemilaminectomy in 2000 by Dr. Joya Salm.  4.  Right knee osteoarthritis.  5.  History of anxiety and depression, currently on Celexa.  6.  Lower extremity dysesthesias.   PLAN:  For lower extremity dysesthesias, she has been tried on Neurontin and  Topamax.  She did not tolerate them well.  The Neurontin gave her nightmares  and she had poor sleep while she was on it.  The Topamax caused some  dysesthetic sensations and it felt like her hair was crawling.   She felt she did quite well earlier on Elavil on the smaller dose rather  than the 25 mg dose and she is tolerating her Celexa quite well as well.  She seems to be satisfied with our current care plan for her pain management  using Elavil, Biofreeze and Celexa.   For flare ups, she does tolerate Ultracet.  She is currently not taking it  at this time.  We will consider that, however.   As I was giving her the prescriptions today, she inquired about some right  hip pain and asked me if I had time to examine her right hip more  thoroughly.  She really had no mentioned it was a major problem earlier in  the visit, so I did not check range of motion and tenderness around the  area.  She will put a gown on at the next visit and I will look at it in one  month.  May consider x-rays for the area.  She is pointing to pain around  the lateral aspect.  It might possibly be a trochanteric bursitis, possibly   sacroiliac or radiation from her sciatica.  At this point it does not appear  to be related to actual hip joint problems.   Will see her back then in one month.  She was given the following  prescriptions:  Celexa 20 mg one p.o. daily #30, Elavil 10 mg p.o. q.h.s.  #30 and Biofreeze 32 oz bottle with three refills.       DMK/MedQ  D:  07/23/2004 12:21:45  T:  07/23/2004 14:16:54  Job #:  [id number]

## 2011-02-15 NOTE — Procedures (Signed)
NAME:  [name], [name]                           ACCOUNT NO.:  [id number]   MEDICAL RECORD NO.:  [id number]                   PATIENT TYPE:  REC   LOCATION:  [facility]                                  FACILITY:  [facility]   PHYSICIAN:  Charlett Blake, M.D.           DATE OF BIRTH:  12-Feb-1946   DATE OF PROCEDURE:  12/29/2003  DATE OF DISCHARGE:                                 OPERATIVE REPORT   PROCEDURES:  Right L4 transforaminal epidural sternoid injection under  fluoroscopic guidance.   Written informed consent was obtained after discussing the risks and  benefits of the procedure with the patient.  These risks included paralysis,  increased pain, numbness in the lower extremities, and loss of bowel and  bladder function, as well as bleeding, bruising, infection, and side effects  associated with the medication injected.   Medication review indicates no anticoagulants.  She denies taking any  aspirin, aspirin products, or warfarin/Coumadin.   The patient was placed prone on the fluoroscopic table.  Betadine prep and  sterile drape.  One percent lidocaine to skin and subcutaneous tissues using  1.25 inch, 25 gauge needle and then a 22 gauge spinal needle with stylet was  inserted under oblique view.  It was manipulated under fluoroscopic guidance  into the right L4 foramen.  Then 0.5 mL of Omnipaque 180 was injected.  This  demonstrated foraminal flow, but not medial into the epidural space.  Therefore, the needle was advanced 1-2 mm and another 0.5 mL of Omnipaque  injected showed flow medially along the pedicle and then into the epidural  space.  There was no intravascular uptake noted.  Then a solution of 1 mL of  40 mg/mL Kenalog and 2 mL of 1% methylparaben-free lidocaine was injected.  The patient tolerated the procedure well.  Post injection instructions  given.  The patient will follow up with Dr. Lucia Estelle.  The preinjection  pain level down the right leg was 3/10 and post  injection was 0/10.  Of note  is that the prior left L4 transforaminal has been still effective in terms  of reducing left lower extremity pain performed on December 01, 2003.                                                Charlett Blake, M.D.    AEK/MEDQ  D:  12/29/2003 13:47:01  T:  12/29/2003 14:19:38  Job:  [id number]   cc:   Franchot Gallo, M.D.  Fax: [phone]   Leeroy Cha, M.D.  [address]  [place], [place] [address]  Fax: [phone]

## 2011-02-15 NOTE — Op Note (Signed)
[facility]  Patient:    [name]                         MRN: [id number] Proc. Date: 09/07/99 Adm. 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  OPERATION:  Bilateral L4/5 hemilaminotomy.  Bilateral foraminotomies. Decompression of the L5 and L4 nerve root.  Midas Rex and microscope.  SURGEON:  Zigmund Daniel. Joya Salm, M.D.  ASSISTANT:  Madelon Lips. Quentin Cornwall, M.D.  INDICATION:  The patient had been seen by me for almost a year because of back nd bilateral leg pain right worse than the left.  The patient fell back in May 1999. She has failed conservative treatment.  She had surgery on the right knee which improved the local pain but noted shooting pain down the right leg.  X-rays show bilateral L4/5 stenosis secondary to thickening of the ligament and  arthropathy.  Surgery was advised.  The goal was to do decompression of the L5 nerve root without need of doing discectomy.  She knew she had a 20% chance of requiring fusion later on.  DESCRIPTION OF PROCEDURE:  The patient was taken to the operating room and she as positioned in a prone manner.  A midline incision from L4 to L5 was made. Muscle and fascia were retracted laterally.  We identified the L4/5 space.  With the Midas Rex, we did the laminotomy with L4 and L5 bilaterally.  We brought the microscope into the area.  Microdissection was started with removal of thick, yellow ligament.  In the right side, we found that there was an overgrowth of the ligament with narrowing of the foramen.  Foraminotomy using the 2 and 3 mm Kerrison punch was  achieved.  Investigation of the L4 and L5  nerve root soon after was essentially  normal with no evidence of any stenosis.  The same procedure was done at the level on the left side.  Having good decompression, the area was irrigated.  Valsalva maneuver was negative.  Fat was left in the epidural space, as well as fentanyl and Depo-Medrol.  From then on, the area was irrigated and closed with Vicryl and a Steri-Strip. DD:  09/07/99 TD:  09/08/99 Job: [id number]

## 2011-02-15 NOTE — H&P (Signed)
NAMEMARLIYAH, [name]                 ACCOUNT NO.:  [id number]   MEDICAL RECORD NO.:  [id number]          PATIENT TYPE:  AMB   LOCATION:  DAY                           FACILITY:  [facility]   PHYSICIAN:  [name], M.D.    DATE OF BIRTH:  Feb 14, 1946   DATE OF ADMISSION:  DATE OF DISCHARGE:  LH                                HISTORY & PHYSICAL   PRESENTING COMPLAINT:  Solid food dysphagia.   HISTORY OF PRESENT ILLNESS:  [name] is a 65 year old Caucasian female who  presents with progressive dysphagia. She states it has been getting worse  for about 18 months, but she noted a big change about 5 weeks ago.  She  points to her mid sternal area as the site of bullous obstruction, and food  eventually passes down.  She does not have difficulty with liquids.  She  rarely has heartburn as long as she is taking her medicines.  She denies  hoarseness, chronic cough, nausea, vomiting, melena, or rectal bleeding.  Her bowels are moving regularly.  She has a good appetite.  She is doing  Weight Watchers, and she has lost a few pounds recently.  There is no  history of recent antibiotics use, and she does not take any NSAIDs.   She has history of esophageal ring which was last dilated in 1998, but she  did not have esophageal dilation with EGD in December 2003.  On that exam,  she had a small sliding hiatal hernia but also gastritis.  H. pylori  serology was negative.  Colonoscopy revealed UC in remission, external  hemorrhoids.  Biopsies from the colon were negative for dysplasia.   She is on:  1.  Nexium 40 mg q.a.m.  2.  Asacol 8 mg twice daily.  3.  Altace 10 mg daily.  4.  Vitorin 10/40 daily.  5.  Amitriptyline 5 mg nightly.  6.  Benicar 40/35 daily.  7.  Osteo BiFlex daily.   PAST MEDICAL HISTORY:  1.  Chronic GERD.  She has undergone EGD with esophageal dilation in 1998      and EGD in December 2003 as above.  2.  Ulcerative colitis of about 20 years duration which has remained in  remission.  Last surveillance colonoscopy was in December 2003, and      multiple biopsies were negative for dysplasia.  3.  History of mildly elevated transaminase, possibly secondary to fatting      liver; transaminase has recently been normal.  Her BNC markers have been      negative.  4.  Hyperlipidemia.  5.  Peripheral neuropathy.   PAST SURGICAL HISTORY:  1.  Hysterectomy.  2.  Bilateral breast reduction because of multiple cysts and fluid      collections.  3.  Decompression of right carpal tunnel  4.  Laparoscopic cholecystectomy December 1992.  5.  In May 2000, she had lumbar laminectomy.  6.  She had a right knee arthroscopy in December 2000 with redo February 12,  7.  She is status post bilateral oophorectomy in 2002.   ALLERGIES:  PENICILLIN, NOROCAINE.   OBJECTIVE:  VITAL SIGNS: Weight 200 pounds.  She is 5 feet 2 inches tall.  Pulse 92 per minute, blood pressure 124/68, temperature 97.9.  HEENT:  Conjunctivae pink.  Sclerae nonicteric.  Oropharyngeal mucosa  normal. Dentition in satisfactory condition.  NECK:  No neck masses or thyromegaly noted.  CARDIAC: Regular rate and rhythm, normal S1, S3noted, no murmur or gallop  noted.  LUNGS:  Clear to auscultation.  ABDOMEN: Soft and nontender with no organomegaly or masses.  RECTAL: Exam was deferred.  EXTREMITIES:  She does not have peripheral edema or clubbing.   ASSESSMENT:  1.  Dysphagia.  [name] presents with solid food dysphagia, more pronounced      over the last 5 weeks, but she has had it sporadically for a year and      one-half.  Her heartburn is well controlled with therapy.  She has      history of Schatzki's ring which was last dilated in 1998.  I suspect      this has recurred.  I doubt that she has developed stricture given that      her symptoms have been well controlled.  2.  Chronic ulcerative colitis.  She remains in remission.  Last      surveillance colonoscopy was in December 2003.  She may  consider one      sometime this year or next.   PLAN:  1.  Esophagogastroduodenoscopy  and possible esophageal dilation at [facility] in the near future.  2.  Since she may be having side effects with NEXIUM (not mentioned above),      Will try her on Zegerid 40 mg p.o. q.a.m., 2 week supply given.   Procedure reviewed with the patient, and she is agreeable.      [name], M.D.  Electronically Signed     NR/MEDQ  D:  11/28/2005  T:  11/28/2005  Job:  [id number]   cc:   Margarita Rana, M.D.  Fax: [facility]  Fax: [phone]

## 2011-02-15 NOTE — Assessment & Plan Note (Signed)
[name] is a 65 year old retired white female who was a patient of  Leeroy Cha, M.D.  She was last seen back in May of 2006.  She is back in  today and is requesting a hip injection.  At her last visit in May, she  underwent a right trochanteric injection.  Apparently has done well very  with it up until the last couple of weeks.  She has had return of her right  lateral hip pain.  She denies any new numbness, tingling, or weakness.  The  pain is typically worse when she climbs stairs and walks.  She has also been  on a bicycle riding about 2 miles a day.   She tells me she has been doing some of her stretching exercises, but is  wondering whether or not she is performing them correctly.  Also concerned  about whether there might be a minor leg length discrepancy.  Her pain in  the hip she describes as about a 5 on a scale of 10, is described as  burning, stabbing, and aching in nature.  Sleep is fair.  Pain is notably  worse with walking, bending, standing, occasionally sitting, improves with  rest heat, ice, TENS unit, injections.  She gets fair relief with her  current medications at this time.   She walks about 20 minutes at a time and rides a stationary bicycle for  about 2 miles each day.  She is able to climb stairs and drive.  She has  been staying busy doing some sewing, monogramming, and volunteering at her  granddaughter's school.  She also has been taking care of her father who  lives next door.   She has been independent with all of her self-care.   Health and history form I reviewed, she will follow up with primary care for  problems with diarrhea, constipation, limb swelling, and night sweats.   PAST MEDICAL HISTORY:   SOCIAL HISTORY:   FAMILY HISTORY:  Unchanged since her last visit.   PHYSICAL EXAMINATION:  VITAL SIGNS:  Blood pressure 118/77, pulse 77,  respirations 16, 97% saturated on room air.  GENERAL:  She is a well-developed, mildly obese,  white female.  She does not  appear in any distress.  She is talkative and laughing.  Affect is bright  and alert.  She is oriented x3.  She is able to stand easily after being  seated.  Her gait is initially a little antalgic.  Regarding the right lower  extremity slightly decreased weightbearing.  Her range of motion is mildly  limited in all planes.  Seated reflexes are symmetric and intact in the  lower extremities at patellar tendons and Achilles tendons.  Normal motor  tone is noted.  She has excellent strength 5/5 throughout hip flexors, knee  extensors, dorsiflexors, plantar flexors, EHL, no new sensory deficits.   Again is exquisitely tender over the right trochanter upon palpation.   Informed consent for a trochanteric bursitis injection was reviewed.  Her  risks include bleeding, infection, bruising, possible nerve damage,  increased pain, weakness, numbness.   She would like to proceed.  Alternatives also were presented to her and  reviewed.   She wished to proceed with the injection.  The area was cleaned with  Betadine and alcohol.  Then using a 25 gauge spinal needle, 2 mL of Kenalog  and 2 mL of 1% lidocaine were injected into the area without any difficulty.  She tolerated the procedure well.  There were no complications.  The patient  had an appropriate recovery and after injection as she walked in the room,  she reported improvement in the pain in her lateral hip area.  There were no  barriers to communication and she was given discharge instructions and was  given a 1 months' follow-up for a return visit.  I also filled out a  physical therapy prescription for her to attend therapy 1-2 times to review  her exercise  program to stretch the iliotibial band.  I also asked them to check her for  a leg-length discrepancy.  We will see her back in a month.           ______________________________  Franchot Gallo, M.D.     DMK/MedQ  D:  07/24/2005 15:33:41   T:  07/24/2005 23:59:58  Job #:  [id number]

## 2011-02-15 NOTE — Assessment & Plan Note (Signed)
HISTORY OF PRESENT ILLNESS:  [name] is a 65 year old white female who is  a patient of Dr. Harley Hallmark.  She is being seen in our Pain and Rehabilitative  Clinic for low back and right leg pain.   At the last visit she was noted to have tender trochanters and was injected  with 2 cc of Celestone and 3 cc of 1% Lidocaine.   She is back in today and reports that she had complete resolution of her  pain for several days.  Her pain is now diminished.  It was an average of  approximately 5-7 on a scale of 10.  It is down to a 3 today.  She is quite  pleased with the results of the injection.   She reports no new problems with bowel or bladder.  She is currently  independent with her ADLs.  She is able to drive.  She is walking 15 minutes  a day.  Sleep is overall fair.  She denies any suicidal ideations.   She is requesting refill on her medications of Celexa and Elavil at this  time.   PHYSICAL EXAMINATION:  GENERAL:  Exam today reveals a mildly obese white  female in no apparent distress.  Affect is overall bright and alert.  She is  oriented x3.  VITAL SIGNS:  Blood pressure is 150/85, pulse 82, respirations 16, 95%  saturated on room air.  Her elevated blood pressure is noted to her.  EXTREMITIES:  She is able to get out of the chair without any difficulty.  Her gait is normal.  She has some limitations of forward flexion.  Gait is  absolutely stable.  No balance problems _________.  Reflexes are 2+ at the  knees, 1+ at the ankles.  Toes are downgoing.  No clonus is noted, 5/5  strength in lower extremities is noted at hip flexors, knee __________,  dorsiflexors, plantar flexors, EHL.  Examination of her right trochanter  reveals no tenderness today.   IMPRESSION:  1.  Lumbago.  2.  Lumbar stenosis.  3.  L4-5 hemilaminectomy in 2000 by Dr. Joya Salm.  4.  Right knee osteoarthritis.  5.  History of anxiety and depression.  6.  Resolution of right trochanteric bursitis.   PLAN:   Will get patient set up for physical therapy to address mechanical  deficits in the right lower extremity with ultrasound to the iliotibial band  and lower extremity stretching program and will have them give her home  program  as well.  Will refill her medications including the Celexa 20 mg one p.o.  daily, #30, and the Elavil 25 mg one p.o. q.h.s., #30.  Will have a note  sent over to Dr. Joya Salm.  We will see her back in a month.       DMK/MedQ  D:  10/17/2004 16:22:49  T:  10/17/2004 17:46:31  Job #:  [id number]   cc:   Leeroy Cha, M.D.  [address]  [place], [place] [address]  Fax: [phone]

## 2011-02-15 NOTE — Assessment & Plan Note (Signed)
[name] is a 65 year old retired white female who is being seen in our  pain and rehabilitation clinic for chronic low back pain and right lateral  leg pain.   She was last seen back in October of 2006.   She has intermittent problems with trochanteric bursitis.   She has undergone physical therapy and has been injected several times over  the last couple of years.  Her last injection was done on July 24, 2005.   She is back in today and reports that she has had a flare-up in the right  lateral leg, right lateral hip pain and is requesting repeat injection.  Her  pain is averaging about a 3 on a scale of 10.  However, at the end of the  weekend, especially Sunday, Monday, Tuesday she had a flare-up especially  she has been up active with the leg.   She continues to do some stretches and icing it intermittently.   She is also requesting refill of her amitriptyline today as well.   Her sleep is fair.  She gets little relief with the medications she is on.  She is able to walk about 15 minutes at a time.  Is able to climb stairs and  drive.  She is independent with her self care.  Does admit to some  difficulty walking because of the lateral hip pain, however.   No other changes noted in past medical, social, or family history.   PHYSICAL EXAMINATION:  VITAL SIGNS:  Blood pressure 143/76, pulse 94,  respirations 16, 99% saturated on room air.  GENERAL:  She is well-developed, mildly obese female who appears her stated  age.  She is oriented x3.  Her affect is bright, alert.  She is cooperative  and pleasant.  NEUROLOGIC:  She is able to transition from sit to stand without difficulty.  Her gait in the room is mildly antalgic.  She has tenderness over the right  trochanter with palpation.  Her reflexes are otherwise symmetric and intact.  Her motor strength is good in both lower extremities with the exception of  right EHL is slightly weaker than the left EHL.  Straight leg  raise is  negative.   Informed consent for trochanteric bursitis was reviewed with her.  Her risks  include bleeding, infection, bruising, possible nerve damage, increased  pain, weakness, numbness.  She wishes to proceed.  Alternatives were also  presented to her and reviewed.   She wished to proceed with injection today.  Area was cleaned with Betadine  and alcohol, then using a 25-gauge spinal needle 1 mL of Kenalog and 3 mL of  1% lidocaine were injected into the area without any ___________.  She  tolerated the procedure well.  There were no complications.  She states she  had improvement in the pain after the injection.  She was able to walk about  the room and appeared pleased with the injection at this point.   Discharge instructions were given.  No barriers to communication were noted.  We will see her back in one month.  Refill for amitriptyline was written out  10 mg one to two p.o. q.h.s. #60 with two refills.  I want her to not take  any desipramine while she is on amitriptyline.           ______________________________  Franchot Gallo, M.D.     DMK/MedQ  D:  03/19/2006 13:14:03  T:  03/19/2006 15:00:22  Job #:  [id number]

## 2011-02-15 NOTE — Procedures (Signed)
NAME:  [name], [name]                           ACCOUNT NO.:  [id number]   MEDICAL RECORD NO.:  [id number]                   PATIENT TYPE:  REC   LOCATION:  [facility]                                  FACILITY:  [facility]   PHYSICIAN:  Charlett Blake, M.D.           DATE OF BIRTH:  1946-08-20   DATE OF PROCEDURE:  DATE OF DISCHARGE:                                 OPERATIVE REPORT   The patient was referred by Franchot Gallo, M.D., for left L4  transforaminal epidural sternoid injection.   REVIEW OF MEDICATIONS:  No anticoagulants.   ALLERGIES:  1. PERCOCET.  2. THROAT SPRAY.   Has received lidocaine as part of epidural sternoid injection in the past  and had no problem with that.  No steroid or contrast allergy.   Informed consent was obtained after discussing the risks and benefits of the  procedure with the patient.  These risks include bleeding, bruising,  infection, paralysis of the lower extremities, loss of bowel and bladder  function, and loss of sensation in the lower extremities.  The patient  wished to proceed and she signed written consent.   The patient was placed prone on the fluoroscopy table.  Betadine prep and  sterile drape.   The skin was anesthetized with 1 mL of 1% lidocaine.  A 25 gauge 1/4-inch  needle was utilized for skin and subcutaneous tissues.  Then a 22 gauge 3.5-  inch spinal needle with stylet was advanced using subpedicular approach at  left L4.  The AP view was utilized to ensure not exceeding the 6 o'clock  position.  Omnipaque 180 x 1 mL demonstrated nerve root and subpedicular  flow pattern with live floral injection.  No evidence of intravascular  uptake.  Then a solution containing 40 mg/mL Kenalog x 1 mL of lidocaine  methylparaben-free x 2 mL 1% were injected.  The patient tolerated without  complications.  The preinjection pain level was 4/10 and post injection was  0/10.                                                Charlett Blake, M.D.    AEK/MEDQ  D:  12/01/2003 14:48:44  T:  12/01/2003 15:47:14  Job:  [id number]

## 2011-02-15 NOTE — Assessment & Plan Note (Signed)
REFERRED BY:  Leeroy Cha, M.D.   HISTORY:  The patient is a 65 year old woman who is coming in [facility] for evaluation of her low back on a recheck today.  She has a history of an L4-L5 hemi-laminectomy and bilateral foraminotomy  dating to December 2000.  She also has a history of some right knee  arthroscopic surgery on two separate occasions.  The patient recently had been treated for her low back pain and her  neuropathic bilateral lower extremity pain, felt to be secondary to  radicular symptoms.  She underwent a left L4 transforaminal epidural steroid  injection on December 01, 2003, and on December 29, 2003, she underwent a right L4  transforaminal epidural steroid injection.  Her pain scores have improved  significantly in the last couple of months.  Her average pain now is  approximately a 2-3 on a scale of 10.  Her least pain is about a 1/10.  Her  worst pain has been about a 4/10.  She has continued in pool therapy, going  twice a week, to maintain her strength and flexibility and mobility.  She  reports to me that she is now pacing her activities and feels that this is  also helping manage her pain.  She does not take the Neurontin any more, and  she does not take Ultram.  She felt that the desipramine was not helping  significantly with her sleep, and she did find the Lidoderm somewhat  helpful, and is today asking about some Biofreeze.   FUNCTIONAL STATUS:  The patient is staying active.  She goes to the pool two  times a week.  She was out planting her flowers.  She does help with light  household activities, including loading the dishwasher, some cooking, and  washing the clothes, although she does not carry the laundry basket.  She  also does some sewing. She reports to me that she recently went to some  gardens in [place] here, and walked approximately one hour, which is  the most she has walked in a long time.   PHYSICAL EXAMINATION:   VITAL SIGNS:  Blood pressure was slightly elevated at  151/91.  She was informed of this.  She tells me that she had just taken her  blood pressure medicine, but will let her internist know.  Pulse 75,  respirations 14, 97% saturation on room air.  GENERAL:  She appears comfortable and prefer to lie down during our  interview; however, she is able to sit up easily using good spine-safe  techniques.  Her appearance:  She is well-groomed.  She appears in no  distress.  She is appropriate, cooperative and bright.  NEUROLOGIC:  She is able to get off of the exam table easily today.  She  walks in the room with a normal gait.  She has a decreased range of motion  with flexion, extension and lateral flexion of her lumbar spine.  She has a  negative Romberg's.  As she is seated, I check her reflexes.  They are 2+ in  the upper extremities, in the biceps, triceps, brachial radialis, and 2+ at  the patella tendons and Achilles bilaterally.  Toes are downgoing.  No  clonus is noted.  She has decreased sensation in the right L4 dermatome.  She has weakness in the right EHL.  The rest of the motor exam reveals 5/5  strength in the lower extremities, including hip flexors, knee extensors,  dorsiflexors and plantar flexors.  EXTREMITIES:  Exam reveals normal lower extremity tone, normal color.  No  edema.  Straight leg raising is negative, with 5/5 strength.  No crepitus,  swelling, or joint tenderness is noted with either knee.   IMPRESSION:  1. Lumbar spondylosis.  2. Status post L4-5 hemi-laminectomy and bilateral foraminotomy in December     2000.  3. Chronic low back pain.  4. Bilateral lower extremity dysesthesias, now improved.  5. Right knee degenerative joint disease, overall doing well with this as     well.   PLAN:  1. Will discontinue Neurontin.  2. Will discontinue desipramine.  3. Will continue Ultram on a p.r.n. basis.  Currently she is not really     taking this.  4. Will  continue pool therapy twice a week.  5. Will continue to have her pace her activities appropriately.  6. Will add Biofreeze today.  7. I will be happy to refill the Lidoderm if she feels this has been     helpful.  8. Will start her on Elavil 10 mg, one p.o. q.h.s., #10 units given.  If she     calls in and reports that this is too sedating for her, may switch her to     Nortriptyline.  9. Will see her back in one month.  10.      Will send a copy of this note over to Dr. Leeroy Cha.      Franchot Gallo, M.D.   DMK/MedQ  D:  01/25/2004 10:55:22  T:  01/25/2004 11:34:20  Job #:  [id number]   cc:   Leeroy Cha, M.D.  [address]  [place], [place] [address]  Fax: [phone]

## 2011-02-15 NOTE — Group Therapy Note (Signed)
REFERRING PHYSICIAN:  Dr. Leeroy Cha.   REASON FOR CONSULTATION:  Status post lumbar fusion, history of spondylosis.   CHIEF COMPLAINT:  Patient reports 3 main problems, low back pain, bilateral  lower extremity pain, worse on right, and bilateral foot numbness and  tingling.   HISTORY:  Ms. 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  She underwent a bilateral L4-5 hemilaminectomy and bilateral  foraminotomy with decompressions of L5 and L4 nerve roots; this was done on  September 07, 1999 by Dr. Joya Salm.  She has also received some lumbar  injections for pain relief as well.  She reports that her main complaint is  the low back pain which goes across the lumbar area bilaterally and this  pain is constant for her, but it varies from a 2 to a 7 or 8 on a scale of  10.  She has 3 to 4 good days a week and every other day is up to a 5 or 6  on a scale of 10.  The other main problem she has is bilateral foot tingling  and burning which is fairly constant but bothers her a great deal more at  night when she is trying to go to sleep and rest.   She has been involved in a physical therapy program which includes a pool 2  times a week in [place], [place] and she also does physical therapy in  their department as well.  She reports that her main limitations include  prolonged sitting or standing or walking, which is about 10 to 15 minutes  for all of the above.  She needs to change positions rather frequently when  she has to sit for a prolonged period of time, and walking; she really has  trouble walking much more than 10 to 15 minutes maximum.  She has been on  various medications over the last years including amitriptyline,  Vioxx,  Celebrex, Bextra, Neurontin.   FUNCTIONAL STATUS:  Overall, this is a fairly high-functioning woman.  Her  main limitations are in prolonged sitting or walking.   REVIEW OF SYSTEMS:  Review of systems is remarkable for high blood pressure,  history of ulcerative colitis and some insomnia.   EMOTIONAL/SOCIAL HISTORY:  The patient reports overall her mood is good.  She feels as though she is coping well.  She denies any kind of harm to  herself or others or suicidal ideation.  She has been married 52 years.  She  used to work at the [employer], retired in 2003.  She lives  with her husband.  She denied alcohol, tobacco or illegal drug use.   FAMILY HISTORY:  Mother died at age 5 with history of diabetes.  Father  alive at age approximately 9 and has heart disease.   ALLERGIES:  Allergy to PERCOCET -- she gets a rash -- and THROAT SPRAY.   MEDICATIONS:  Medications at this time include the following:  1. Altace 1 daily 10 mg.  2. Asacol 2 tablets b.i.d. 400 mg  3. Hydrochlorothiazide 20 mg 1 daily.  4. Prilosec 1 p.o. b.i.d.  5. Estratest 1 daily.   PAST MEDICAL HISTORY:  1. Ulcerative colitis.  2. The patient  had recent fasting blood sugar which was apparently in the     normal range within the last 3 months.   PAST SURGICAL HISTORY:  1. Right knee arthroscopic surgery, May 2000.  2. In February 2002, another right knee arthroscopic surgery by Dr. Ames Coupe.  3. Tarsal tunnel surgery on left.  4. Status post L4-5 hemilaminectomy and bilateral foraminotomies, December     2000.   PHYSICAL EXAMINATION:  VITAL SIGNS:  On exam, the patient has a blood  pressure of 145/82, pulse of 70, 98% saturation on room air.  GENERAL:  She is a well-developed, obese woman, did not appear in any  distress during in her interview.  She is well-groomed, was appropriate as  well as cooperative and optimistic.  SKIN:  Skin was remarkable for a well-healed scar  over her lumbar spine.  NEUROMUSCULAR:  Cranial nerves were grossly intact.  Sensation was decreased  in the left L4 and 5 dermatomes with pinprick and in the right L5 dermatomes  with pinprick.  Romberg's test was normal.  Gait was normal.  Seated, her  reflexes were 2+ in the upper extremities, at the biceps and brachioradialis  and triceps, 3+ at the patellar tendons bilaterally and at the Achilles'  tendons bilaterally.  Toes are downgoing.  No clonus is noted.  Vibratory  and position sense were intact.  Her range of motion in her lumbar spine was  mild-to-moderately limited in all planes.   ASSESSMENT:  1. Spondylosis.  2. Chronic low back pain.  3. Bilateral lower extremity dysesthesias.  4. Right knee degenerative changes.   PLAN:  I strongly encourage her to continue her water therapy program.  We  discussed pacing issues briefly with her.  We will give her a prescription  for Ultram 50 mg 1 p.o. q.6 h., 60 units, desipramine 10 mg 1 p.o. nightly,  30 units, and Neurontin 300 mg 1 p.o. nightly, 30 units.  We will also give  her some samples of Lidoderm to put on her feet at night.  The Ultram she  may only need to use 3 to 4 times a week; we will monitor this at the next  visit.  She reports she is not real interested in being on any long-term  medications.  We did discuss possibility of doing epidural or selective  nerve root injection.  She appears to be interested in this as well and we  will talk more about it at our next visit.     Franchot Gallo, M.D.   DMK/MedQ  D:  11/02/2003 13:07:52  T:  11/02/2003 14:19:33  Job #:  [id number]   cc:   Leeroy Cha, M.D.  [address]  [place], [place] [address]  Fax: [phone]

## 2011-02-15 NOTE — Assessment & Plan Note (Signed)
MEDICAL RECORD NUMBER:  [id number].   [name] is a 65 year old white female, a referral from Dr. Joya Salm.   She is being seen in our pain and rehabilitative clinic for chronic low back  pain and right trochanteric bursitis.   She is back today and is requesting repeat injection in her right hip.   We discussed risks and benefits today and alternative therapies which would  include some physical therapy.   She would like to undergo some physical therapy with ultrasound as well as  proceed with another hip injection.   She also has some milder right low back pain described as an average of 4 on  a scale of 10. Hip is about a 4 on a scale of 10. Pain is worse with  walking, bending, standing. Improves with rest, heat, ice, therapy,  exercise, TENS unit, and injection.   Relief from medications is fair.   She is able to walk about 20 minutes at a time. She has been currently  involved in an exercise program at home and is quite happy and quite pleased  with her exercise activity.   REVIEW OF SYSTEMS:  Review of systems is reviewed. Regarding her blood  pressure, I have asked her to followup with her PCP.   Past medical, family, and social history are unchanged since last visit. She  states she keeps her medications in a secure location.   PHYSICAL EXAMINATION:  Blood pressure 140/79, pulse 83, respirations 16, 98%  saturated on room air. She is a well-developed, mildly obese, white female  who does not appear in any distress. Her affect is overall bright, alert,  cooperative, and pleasant. She is able to stand without any difficulties.  She walks in the room. She has some limitations in lumbar range of motion,  especially in forward flexion and extension. Seated reflexes are 2+ at the  knees, 1+ at the ankles. Motor strength is 5/5. She has no tenderness over  the left trochanters. She has exquisite tenderness again over the right  trochanter and slightly down the iliotibial  band.   IMPRESSION:  1.  Lumbago.  2.  Lumbar stenosis.  3.  L4-5 hemilaminectomy by Dr. Joya Salm in 2000.  4.  History of right knee osteoarthritis.  5.  History of anxiety and depression.  6.  Right trochanteric bursitis.  7.  Lower extremity dysesthesias.   PLAN:  The patient would like to have the right trochanter injected again.  The risks and benefits were reviewed with her. Alternatives of therapy were  also reviewed with her. She wanted to proceed with injection today. The area  was cleaned initially with alcohol and then Betadine. Using sterile  technique and a 22-gauge needle, 2 cc of Kenalog and 2 cc of 1% lidocaine  were injected into the area. She tolerated the procedure well. There were no  complications reported. The patient had an appropriate recovery, and she  noted an improvement after the injection. She is discharged in good  condition today. There were no barriers to communication. The following  medications were written for her in addition: Elavil 10 mg 1 to 2 p.o.  q.h.s. #45. Celexa 20 mg #30 one p.o. q.d. Three refills were  given to her. We will also set her up for physical therapy for ultrasound,  stretching to the iliotibial band, and a home program. We will see her back  in three months.      DMK/MedQ  D:  02/22/2005 12:27:09  T:  02/22/2005 14:23:07  Job #:  [id number]

## 2011-02-15 NOTE — Op Note (Signed)
[name], [name]                 ACCOUNT NO.:  [id number]   MEDICAL RECORD NO.:  [id number]          PATIENT TYPE:  OIB   LOCATION:  0300                         FACILITY:  [facility]   PHYSICIAN:  Domingo Pulse, M.D.  DATE OF BIRTH:  August 05, 1946   DATE OF PROCEDURE:  09/12/2006  DATE OF DISCHARGE:                               OPERATIVE REPORT   PREOPERATIVE DIAGNOSIS:  Rectocele, cystocele.   POSTOPERATIVE DIAGNOSIS:  Rectocele, cystocele.   PROCEDURES:  Rectocele repair with graft interposition (Apogee System).   SURGEON:  Dr. Amalia Hailey.   ASSISTANT:  Dr. Matilde Sprang and also Dr. Ky Barban.   ANESTHESIA:  General.   COMPLICATIONS:  None.   DRAINS:  A Foley catheter removed postoperatively.   HISTORY:  This 65 year old female is status post vaginal hysterectomy  with anterior and posterior a number of years earlier.  The patient is  now known to have a fairly severe rectocele with rectal problems.  She  also has some degree of was thought to be mixed incontinence.  When the  patient underwent urodynamic evaluation, we could find of stress  incontinence.  She did have some evidence of moderate bladder  instability but no real urgency incontinence, but she was noted to have  a fairly significant rectocele.  There was no sign of diverticulum.  There was normal compliance to the bladder, and there was minimal  descensus with straining.  The patient is mostly symptomatic from the  rectocele.  She is now to undergo rectocele repair.  If, at the time of  the evaluation it is felt that she needs either a cystocele repair or a  sling, she is willing to consider having this done and all appropriate  materials available for those procedures.  The patient understands the  risks and benefits of the procedure and gave full informed consent.   PROCEDURE:  After successful induction of general anesthesia, the  patient was placed in the dorsal lithotomy position, prepped with  Betadine and draped  in the usual sterile fashion.  Careful bimanual  examination showed the patient had a grade 1-2 cystocele, primarily  posteriorly.  The vaginal vault was well-supported.  She does, however,  have a fairly large rectocele that is quite symptomatic.  The decision  was made to proceed with a rectocele repair then repeat our inspection  of the cystocele and see if she needed any repair following the  rectocele repair.  The patient had an infiltration of the posterior  rectal mucosa with local anesthetic including Xylocaine.  An incision  was made in the midline beginning at the perineal body and extending up  all the way to the dimple of the vaginal cuff.  Flaps were raised  bilaterally until the entire rectum was mobilized.  The rectal pillars  were seen postoperatively were opened affording exposure to the spine.  The fascia was then plicated in the midline with a running suture of 2-0  Vicryl, nicely bringing the rectocele in and reducing it.  Care was  taken to avoid undue tension.  Incisions were made at the level of the  rectal fossa 3 cm lateral and 3 cm inferior to the mid portion of the  rectum.  The Apogee needle was then passed with finger guidance from the  rectal fossa up to the spine bilaterally.  TheApoee  system with graft  was then connected and brought into place.  It was trimmed into  position.  The top was anchored at the vaginal cuff, and the bottom was  anchored down at the perineal body.  The mucosa was trimmed and then  after irrigation, was closed with a running suture of 2-0 Vicryl.  Inspection showed that there was good depth to the vagina.  There was  good reduction of the rectocele.  It was really felt there was not much  cystocele, and there truly was reasonable support for the urethra.  Based on the preoperative urodynamic studies which showed she did not  have stress incontinence.  It was felt that it would be best to avoid a  repair, realizing that if  necessary, outpatient surgery using a TOT can  be done should she develop further problems with stress incontinence.  Because she has had some problems with urgency, there was concern that  overcorrection would be problematic.  The area was irrigated, and then a  packing of vaginal pack plus bacitracin was placed.  The patient  tolerated the procedure well and was taken to recovery room in good  condition.  She will be kept in the hospital for 23-hour observation and  be discharged tomorrow.           ______________________________  Domingo Pulse, M.D.  Electronically Signed     RJE/MEDQ  D:  09/12/2006  T:  09/12/2006  Job:  [id number]   cc:   M. Edwinna Areola, MD  Fax: [phone]

## 2011-02-15 NOTE — Assessment & Plan Note (Signed)
REFERRING PHYSICIAN:  Leeroy Cha, M.D.   [name] is a 65 year old woman who has a history of ulcerative  colitis.  She has been referred by Dr. Joya Salm for management of her low back  and leg pain.  She is back into clinic today for a recheck.  Her average  pain is about a 3 on a scale of 10, worst it gets is about a 6 on a scale of  10 and best in the last 24 hours was about 2 on a scale of 10.  She has  remained active.  She is currently engaged in a pool program.  She does  report some mild right shoulder pain recently.   Her lower extremity dysesthetic pain is well controlled with Biofreeze.  She  does not take Neurontin.   Her main complaint is the Elavil seems to be too sedating for her.  Her  sleep is improved significantly, however, she does have some difficulty  waking at her normal time in the morning and she feels it has not been  helpful for her in that way.  She does use Lidoderm on a p.r.n. basis.  She  does not use Ultram very frequently.   She reports that she has had good luck with Celexa in the past and  apparently it has helped her with rest and helped her with general anxiety.  She is wondering if she could go back on that.  She was on it several years  ago.  She is not sure why she discontinued it.   PHYSICAL EXAMINATION:  GENERAL APPEARANCE:  She is alert, cooperative and  pleasant.  VITAL SIGNS:  She has a blood pressure of 150/90, pulse 85, respiratory rate  20, 100% saturated on room air.  LUNGS:  Clear.  CARDIOVASCULAR:  Her heart is in regular rhythm.  ABDOMEN:  Bowel sounds positive, benign.  EXTREMITIES:  Internal and external rotation of her right hip causes some  discomfort in the posterior buttock area.  No groin pain.  Her right knee  examination reveals some intermittent crepitus.  No joint line tenderness.  No medial lateral instability.  No effusion is noted.   She is able to get up off the examination table.  She is somewhat stiff  initially.  Her gait is normal in the room.  She has limitations of forward  flexion, extension, lateral flexion.  Seated, reflexes are 2+ at the knees  and ankles.  toes are downgoing, no clonus is noted.  She has 5/5 strength  at hip flexors, knee extensors, dorsiflexors, plantar flexors and EHL.  Right EHL is 5-/5, left EHL is 5/5.  Straight leg raising is negative.   Sensory examination reveals diminished sensation in the right L5 dermatome.   IMPRESSION:  1. Lumbago.  2. Lumbar spondylosis.  3. Status post L4-5 hemilaminectomy.  4. Bilateral lower extremity dysesthesias.  5. Right knee degenerative joint disease.  6. Anxiety.   PLAN:  Continue Lidoderm, continue Biofreeze. Discontinue Elavil due to  sedating nature.  Will start Celexa 20 mg daily.  Will encourage her to  continue pool therapy two times a week.  Will encourage her to continue  pacing her activities and will see her back in one month.      Franchot Gallo, M.D.   DMK/MedQ  D:  03/07/2004 11:20:34  T:  03/07/2004 12:44:24  Job #:  [id number]   cc:   Leeroy Cha, M.D.  [address]  [place], [place] [address]  Fax: [phone]

## 2011-02-15 NOTE — Assessment & Plan Note (Signed)
DATE OF VISIT:  December 19, 2004.   MEDICAL RECORD NUMBER:  [id number].   DATE OF BIRTH:  1946/07/31.   [name] is back in for a refill of her medications.  She is a 65 year old,  white, married female who is a patient of Dr. Joya Salm.   She is being seen for chronic low back pain which radiates somewhat down her  right leg.  Her average pain is about a 3-4 on a scale of 10, described as  sharp, burning, tingling and aching.  Her sleep is overall fair.  Pain is  worsened with walking, bending, sitting and standing.  Improves with rest,  heat, ice, therapy, pacing, medications, TENS and injections.   Relief from medications currently is fair.  She is currently being treated  with Celexa, Elavil and Biofreeze.   She states that her Elavil does not really kick in until about 3 a.m. for  her.  She is wondering if she might trial another sleeping-type medication.   Mobility-wise, she is able to walk about 15 minutes at a time.  She  occasionally will use a cane.  She is able to climb stairs, but she does go  up slowly.  She is able to drive.  She is disabled.  Last employed in May of  2003.  Admits to some numbness, tingling and spasms.  Denies bowel or  bladder problems.  Denies depression, anxiety or suicidal ideation.   Denies any changes in past medical, social or family history since last  visit.   PHYSICAL EXAMINATION:  Blood pressure 161/96, pulse 91, respirations 16 and  96% saturated on room air.   She is a well-developed, well-nourished woman.  She is oriented x 3.  Her  affect is overall bright and alert.  She is appropriate.  Her gait is  normal.  She has some mild tenderness over the right trochanter again.   IMPRESSION:  1.  Lumbago.  2.  Lumbar stenosis.  3.  L4-5 hemilaminectomy in 2000 by Dr. Joya Salm.  4.  History of some right knee osteoarthritis.  5.  Right trochanteric bursitis.  6.  History of anxiety and depression.  7.  History of ulcerative  colitis.   PLAN:  Refill medications.  Will give her a trial of Trazodone this month,  50 mg one-half to one tablet p.o. q.h.s., #30.  Will discontinue Elavil for  now.  Celexa 20 mg one p.o. daily, #30.  She has been involved in physical  therapy and has done quite well.  She is quite excited about using a  recumbent bicycle.  She finds she is able to get an aerobic workout without  any back pain.  I would recommend that if she is able to, get the Recumbent  [employer]  [id number] bike to help her maintain lower extremity strength, endurance and  flexibility and to help her to keep her weight off.  Will see her back in  one month.      DMK/MedQ  D:  12/19/2004 18:05:56  T:  12/20/2004 10:37:20  Job #:  [id number]

## 2011-02-15 NOTE — Assessment & Plan Note (Signed)
DATE OF SERVICE:  January 16, 2005.   MEDICAL RECORD NUMBER:  [id number].   DATE OF BIRTH:  22-Jun-1946.   [name] is a 65 year old white female, a referral from Dr. Joya Salm.   She is being seen for chronic low back pain and some right trochanteric  bursitis.  She also has a history of some right knee osteoarthritis.   She is back in.  Reports her average pain is about a 5 on a scale of 10.  Aggravating her predominantly right now is her right trochanteric bursitis.  She describes the low back pain as being intermittent, sharp and stabbing  depending on activity level.  It is not as bad when she is sitting.  The hip  pain aggravates her quite a bit when she is up walking.  She gets fair  relief with her medications.  She is able to walk about 20 minutes at a  time.  She is able to climb stairs and drive.  She walks without assistance.  She is retired.  She was last employed back in May of 2003.   NEUROPSYCHIATRIC CHARACTERISTICS:  The patient reports some numbness,  tingling and trouble walking due to hip pain and some spasms.  Denies  suicidal ideation.  Denies depression, anxiety and bowel or bladder  problems.   FAMILY HISTORY:  Remarkable for heart disease, diabetes and high blood  pressure.   PHYSICAL EXAMINATION:  [name] is a well-developed, well-nourished, mildly  obese, white female in no apparent distress during our interview.  Oriented  x 3.  Affect overall bright, alert and cooperative.  Able to stand  independently from a seated position.  Gait is not antalgic.  Does appear  somewhat initially when she takes the first few steps.  Limitations are  noted in lumbar range of motion.  Reflexes 2+ at the knees and 1+ at the  ankles.  Motor strength is 5/5.  She has exquisite tenderness over the right  trochanter with palpation.   IMPRESSION:  1.  Lumbago.  2.  Lumbar stenosis.  3.  L4-5 hemilaminectomy in 2000 by Dr. Joya Salm.  4.  Right knee osteoarthritis.  5.   History of anxiety and depression, currently on Celexa.  6.  Lower extremity dysesthesias.  7.  Right trochanteric bursitis.   The patient would like to have the right trochanter injected again.  This  was done last in discharge of 2005.  She got about four months of relief  with the last injection.  Therapy with the stretching may have flared this  up a little bit for her.  The risks and benefits regarding trochanter  bursitis injection were reviewed with her.  The risks include bleeding,  infection, bruising, increased pain, possible nerve injury and skin changes  at the injection site.  She would like to proceed.  Consent was obtained and  signed.  Using a 22 gauge needle, 2 mL of Kenalog and 2 mL of 1% lidocaine  were injected into the area.  She tolerated the procedure well.  No  complications were reported.  The patient had an appropriate recovery.  She  noted improvement immediately in the right hip after injection.  She was  discharged in good condition.  There are no barriers to communication  perceived today.   The following prescriptions were also written today:  1.  Biofreeze to use  as directed, one bottle.  2.  Celexa 20 mg one p.o. daily #30.  3.  Elavil  10 mg one to two p.o. q.h.s. #45.  She was also given samples of Rozerem for  sleep.  A prescription was also written for a Hausman cube with an inclined  edge 32 inches x 20 inches x 12 inches for her  home exercise program.  This is to help with her lower extremity  flexibility, as well as help with strengthening her truncal muscles.  Will  see her back in a month.      DMK/MedQ  D:  01/16/2005 15:49:33  T:  01/16/2005 17:38:39  Job #:  [id number]

## 2011-02-15 NOTE — Op Note (Signed)
[facility]  Patient:    [name], [name] Visit Number: [id number] MRN: [id number]          Service Type: GYN Location: 1S X005 01 Attending Physician:  Suezanne Cheshire Proc. Date: 02/10/01 Adm. Date:  [id number]   CC:         Jolyn Nap, M.D.  Caswell Corwin, R.N.   Operative Report  PREOPERATIVE DIAGNOSIS:  Complex right adnexal mass.  POSTOPERATIVE DIAGNOSIS:  Right ovarian serous cyst adenoma.  PROCEDURE:  Exploratory laparotomy, bilateral salpingo-oophorectomy, extensive lysis of adhesions, right ureterolysis.  CO-SURGEONS:  Daniel L. Fermin Schwab, M.D., Jolyn Nap, M.D.  ASSISTANT:  Caswell Corwin, R.N.  ANESTHESIA:  General with orotracheal tube.  ESTIMATED BLOOD LOSS:  100 cc.  SURGICAL FINDINGS:  At exploratory laparotomy, extensive adhesions were found between the omentum and the anterior abdominal wall and pelvic peritoneum. The sigmoid colon was densely adherent to both pelvic sidewalls and adherent to the right ovarian mass and the left ovary.  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  The right ovary itself was multicystic, was removed intact.  The mass itself measured approximately 8 cm in diameter.  The exploration of the upper abdomen revealed no abnormalities.  DESCRIPTION OF PROCEDURE:  The patient was brought to the operating room and after satisfactory attainment of general anesthesia, was placed in a modified lithotomy position in [place].  The anterior abdominal wall, perineum, and vagina were prepped with Betadine; a Foley catheter was placed, and the patient was draped.  The abdomen was entered through a low right paramedian incision.  The peritoneum was entered in the midline between the two rectus muscles.  The upper abdomen was explored, and the pelvic peritoneal washings were obtained.  Adhesions from the omentum to the anterior abdominal wall were released with sharp and  blunt dissection.  Cautery was used for hemostasis. Once the omentum was freed, it was recognized that the sigmoid colon was densely adherent to the mass and the left ovary.  Using sharp and blunt dissection, the mesentery and appendices epiploica of the sigmoid colon were mobilized from the right mass.  The right retroperitoneal space was opened, identifying the external iliac artery and vein, internal iliac artery, and ureter.  The paravesical and pararectal spaces were opened.  Retroperitoneal fibrosis was noted in the right parametrial region.  The ovarian vessels were skeletonized, clamped, cut, free-tied, and suture ligated using 2-0 Vicryl. Ureterolysis then performed, freeing the ureter from its peritoneal attachments.  A right angle clamp was used to free the ureter from the dense retroperitoneal fibrosis.  Hemoclips were used in this region to achieve hemostasis.  With the ureter reflected laterally, the peritoneum where the mass is densely adherent to the pelvic sidewall was then incised.  The bladder flap was advanced away from the mass.  Further dissection of the sigmoid colon mesentery was required in order to fully isolate the mass which was then removed from the surgical field and submitted to frozen section with the above-noted report.  Hemostasis was achieved with hemoclips and cautery. Attention was then turned to the left side of the pelvis where the retroperitoneal space was opened.  In a similar fashion, the vessels and ureter were identified.  The ovarian vessels were skeletonized, clamped, cut, free-tied, and suture ligated.  They were also free-tied.  Again, the ovary and tube were freed from their dense adherence to the sigmoid colon mesentery. Ultimately with the ureter identified throughout the dissection, the right tube and  ovary were removed.  They appeared relatively normal.  The pelvis was irrigated with copious amounts of warm saline and hemostasis  ascertained.  The retractors and packs were removed, and the anterior abdominal wall was closed in layers, the first being a running Smead-Jones closure using #1 PDS. Subcutaneous tissue was irrigated; hemostasis was achieved with cautery and the skin closed with skin staples.  A dressing was applied.  The patient was awakened from anesthesia and taken to the recovery room in satisfactory condition.  Sponge, needle and instrument counts correct x 2. DD:  02/10/01 TD:  02/10/01 Job: [id number]

## 2011-02-15 NOTE — Assessment & Plan Note (Signed)
REFERRED BY:  Leeroy Cha, M.D.   HISTORY:  The patient is a 65 year old white female who is being seen in our  clinic for the complaints of low back and right leg pain.  She reports her  pain in the low back is on the average of about a 2 on a scale of 10 and  goes up to about a 4, and never goes down to 0 really, more so sitting,  laying down and walking.  She is able to walk 10-15 minutes at a time.  Denies any problems with bowel or bladder, numbness, tingling, weakness, or  gait changes.  Her function is quite good.  She is independent with all of  her ADL's, sitting, dressing, bathing, all hygiene and meal preparation.  Independent with domestic tasks, including dishes, cleaning and laundry.  She is independent driving.  Currently not working outside the home.  She  reports her sleep is overall fair.  Denies any harm to self or others.  She reports she is coping quite well.  No new changes in her medial history.  She is about to have some moles  removed in another clinic today.   MEDICATIONS:  1. Altace.  2. Asacol.  3. Hydrochlorothiazide.  4. Prilosec.  5. Estratest.  6. Celexa.  7. Lidoderm.  8. Biofreeze.  9. Elavil 10 mg.  She is requesting an increased dose of Elavil.  This apparently is working  fairly well for her, and she is not suffering undue sedation in the morning.   PHYSICAL EXAMINATION:  VITAL SIGNS:  Blood pressure 159/96, pulse 95,  respirations 20, saturation 96% on room air.  GENERAL:  She is alert, oriented and cooperative.  Overall her mood is  bright.  She is a well-nourished, well-developed female, in no apparent  distress.  She is stable to get out of the chair easily.  Her gait in the  room is normal.  NEUROLOGIC:  Romberg test negative.  Sensory examination:  She reports  decreased sensation below both knees.  Straight leg raising is negative.  Motor strength is 5/5 at hip flexors, knee extensors, dorsiflexors, plantar  flexors, EHL and knee  flexors.  Reflexes are 2+ at the knees and ankles  bilaterally.  Toes are downgoing.  No clonus is noted.   IMPRESSION:  1. Lumbago.  2. Lumbar stenosis.  3. L4-5 hemilaminectomy in 2000, by Dr. Joya Salm.  4. Right knee osteoarthritis.  On examination crepitus is noted with     function and extension.  No instability was noted, however.  5. Anxiety/depression, currently on Celexa.  6. Lower extremity dysesthesias.  This is also a predominant problem.  She     discussed in the history of present illness today, both feet tingle and     are painful when she wears tight shoes.  She is wondering if there is     anything we can add regarding her medications, to assist with this.  She     has been on Neurontin in the past and found it somewhat sedating.  She     would like to go ahead and try Topamax to assist with the dysesthetic     pain in her feet.   PLAN:  1. Will refill the following medications:     a. Elavil 25 mg, one p.o. q.h.s., #30.  This dose is increased from 10 mg        at the last visit.     b. Celexa 20 mg,  one p.o. daily, #30.  The patient has done well on this        particular medication.     c. Topamax 25 mg, one p.o. q.h.s., #30.  She will start this.  If she has        any problems with it, she will discontinue it immediately and let us        know.     d. Will refill Lidoderm patch 5%, one to three patches 12 hours on and 12        hours off, #90.  Will give her one refill on all of the above.  2. Will send a note with her today to take to her family doctor.  Her blood     pressure was 159/96.  Would like for her to follow up with her family     doctor regarding this issue as well.  3. I will see her back in one month.  If she is doing well, she may cancel     next month's appointment and come back in two months.      Franchot Gallo, M.D.   DMK/MedQ  D:  05/25/2004 11:41:42  T:  05/26/2004 12:12:37  Job #:  [id number]   cc:   Leeroy Cha, M.D.  [address]  [place], [place] [address]  Fax: [phone]

## 2011-04-25 ENCOUNTER — Ambulatory Visit: Payer: Medicare Other | Admitting: Endocrinology

## 2011-05-30 ENCOUNTER — Encounter: Payer: Self-pay | Admitting: Neurology

## 2011-05-30 ENCOUNTER — Encounter: Payer: Self-pay | Admitting: Endocrinology

## 2011-05-30 ENCOUNTER — Ambulatory Visit (INDEPENDENT_AMBULATORY_CARE_PROVIDER_SITE_OTHER): Payer: Medicare Other | Admitting: Internal Medicine

## 2011-05-30 ENCOUNTER — Encounter: Payer: Self-pay | Admitting: Internal Medicine

## 2011-05-30 ENCOUNTER — Ambulatory Visit (INDEPENDENT_AMBULATORY_CARE_PROVIDER_SITE_OTHER): Payer: Medicare Other | Admitting: Endocrinology

## 2011-05-30 VITALS — BP 128/80 | HR 73 | Temp 98.3°F | Ht 62.0 in | Wt 184.0 lb

## 2011-05-30 DIAGNOSIS — R200 Anesthesia of skin: Secondary | ICD-10-CM

## 2011-05-30 DIAGNOSIS — G629 Polyneuropathy, unspecified: Secondary | ICD-10-CM | POA: Insufficient documentation

## 2011-05-30 DIAGNOSIS — R209 Unspecified disturbances of skin sensation: Secondary | ICD-10-CM

## 2011-05-30 DIAGNOSIS — F411 Generalized anxiety disorder: Secondary | ICD-10-CM

## 2011-05-30 DIAGNOSIS — F419 Anxiety disorder, unspecified: Secondary | ICD-10-CM

## 2011-05-30 DIAGNOSIS — E119 Type 2 diabetes mellitus without complications: Secondary | ICD-10-CM

## 2011-05-30 DIAGNOSIS — R002 Palpitations: Secondary | ICD-10-CM

## 2011-05-30 MED ORDER — ALPRAZOLAM 0.25 MG PO TABS: 0.2500 mg | ORAL_TABLET | Freq: Three times a day (TID) | ORAL | Status: DC | PRN

## 2011-05-30 MED ORDER — LIRAGLUTIDE 18 MG/3ML ~~LOC~~ SOLN: 1.8000 mg | SUBCUTANEOUS | Status: DC

## 2011-05-30 MED ORDER — GABAPENTIN 100 MG PO CAPS: 100.0000 mg | ORAL_CAPSULE | Freq: Every day | ORAL | Status: DC

## 2011-05-30 MED ORDER — GLUCOSE BLOOD VI STRP: ORAL_STRIP | Status: DC

## 2011-05-30 MED ORDER — BUSPIRONE HCL 10 MG PO TABS: 10.0000 mg | ORAL_TABLET | Freq: Two times a day (BID) | ORAL | Status: DC

## 2011-05-30 MED ORDER — GLIPIZIDE ER 2.5 MG PO TB24: 2.5000 mg | ORAL_TABLET | Freq: Every day | ORAL | Status: DC

## 2011-05-30 NOTE — Patient Instructions (Signed)
It was good to see you today. Add Buspar for anxiety and gabapentin for numbness - Your prescription(s) have been submitted to your pharmacy. Please take as directed and contact our office if you believe you are having problem(s) with the medication(s). Other Medications reviewed, no changes at this time. Refill on medication(s) as discussed today. we'll make referral to neurology for opinion about your feet symptoms. Our office will contact you regarding appointment(s) once made. Please schedule followup in 6 months, call sooner if problems.

## 2011-05-30 NOTE — Assessment & Plan Note (Signed)
Chronic issue >5 years - affects BLE/feet Prior eval and labs unremarkable for cause of neuropathy - no NCS on records but reports this "normal" in 2007 Lab Results  Component Value Date   TSH 1.12 10/11/2010   Start low dose gabapentin (many other meds tried and poorly tolerated including elavil, lyrica) Refer to neuro for eval/tx of same

## 2011-05-30 NOTE — Progress Notes (Signed)
Subjective:    Patient ID: [name], female    DOB: January 28, 1946, 65 y.o.   MRN: [id number]  HPI pt states she feels well in general, except for a headache.  she brings a record of her cbg's which i have reviewed today.  It varies from 111-150.   Past Medical History  Diagnosis Date  . MENOPAUSAL DISORDER   . REDUCTION MAMMOPLASTY, HX OF 1994    bilateral  . ULCERATIVE COLITIS   . HYPERTENSION   . GERD   . DIABETES MELLITUS, TYPE II     Past Surgical History  Procedure Date  . Abdominal hysterectomy 1980    total, with bladder tack  . Breast surgery 1994    Bilateral, due to fibrocystic breast changes  . Rectocele repair 1990  . Carpal tunnel release 1996    Right  . Right knee arthroscopy 01/1999    then again 10/2000  . L4-l5 diskectomy 08/1999  . Cholecystectomy     History   Social History  . Marital Status: Married    Spouse Name: N/A    Number of Children: N/A  . Years of Education: N/A   Occupational History  . Not on file.   Social History Main Topics  . Smoking status: Never Smoker   . 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 Alcohol Use: Yes     Rare  . Drug Use: No  . Sexually Active:    Other Topics Concern  . Not on file   Social History Narrative  . No narrative on file    Current Outpatient Prescriptions on File Prior to Visit  Medication Sig Dispense Refill  . bromocriptine (PARLODEL) 2.5 MG tablet Take 2.5 mg by mouth 2 (two) times daily.        . Chromium Picolinate 200 MCG TABS Take 200 mcg by mouth daily.        Marland Kitchen desvenlafaxine (PRISTIQ) 50 MG 24 hr tablet Take 50 mg by mouth daily.        Marland Kitchen esomeprazole (NEXIUM) 40 MG capsule Take 40 mg by mouth daily.        Marland Kitchen estradiol (ESTRACE) 1 MG tablet Take 1 mg by mouth daily.        Marland Kitchen glucose blood test strip And lancets 1 daily.  250.00   100 each  3  . hydrochlorothiazide 50 MG tablet Take 1 tablet (50 mg total) by mouth daily.  90 tablet  3  . metFORMIN (GLUCOPHAGE-XR) 500 MG 24 hr tablet 1/2 tablet every evening       . metoprolol (TOPROL-XL) 100 MG 24 hr tablet 1 tablet by mouth every morning, 1/2 tablet every evening       . ALPRAZolam (XANAX) 0.25 MG tablet Take 1 tablet (0.25 mg total) by mouth 3 (three) times daily as needed.  90 tablet  0  . busPIRone (BUSPAR) 10 MG tablet Take 1 tablet (10 mg total) by mouth 2 (two) times daily.  60 tablet  2  . gabapentin (NEURONTIN) 100 MG capsule Take 1 capsule (100 mg total) by mouth daily.  30 capsule  2  . glipiZIDE (GLIPIZIDE XL) 2.5 MG 24 hr tablet Take 1 tablet (2.5 mg total) by mouth daily.  30 tablet  11  . Liraglutide 18 MG/3ML SOLN Inject 0.3 mLs (1.8 mg total) into the skin every  morning. And pen needles 1/day  6 mL  11    Allergies  Allergen Reactions  . Colesevelam     REACTION: gi sxs  . Nisoldipine   . Oxycodone-Acetaminophen     REACTION: Itch  . Procaine Hcl     Family History  Problem Relation Age of Onset  . Diabetes Mother   . Heart attack Father     Mid 51's  . Alcohol abuse Other     BP 128/80  Pulse 73  Temp(Src) 98.3 F (36.8 C) (Oral)  Ht 5' 2"  (1.575 m)  Wt 184 lb (83.462 kg)  BMI 33.65 kg/m2  SpO2 97%  Review of Systems denies hypoglycemia    Objective:   Physical Exam Pulses: dorsalis pedis intact bilat.   Feet: no deformity.  no ulcer on the feet.  feet are of normal color and temp.  1+ bilat leg edema.  There is a healing insect bite on the dorsal aspect of the left foot.   Neuro: sensation is intact to touch on the feet.        Assessment & Plan:  Dm, Needs increased rx, if it can be done with a regimen that avoids or minimizes hypoglycemia.

## 2011-05-30 NOTE — Progress Notes (Signed)
Subjective:    Patient ID: [name], female    DOB: 1946/03/13, 65 y.o.   MRN: [id number]  HPI  Here for follow up - reviewed chronic medical issues  Ongoing feet numbness, bilateral - chronic issue for years - NCS 2007 or earlier unremarkable by pt report (not available) Intol of amitrip, trazadone, effexor, cymbalta - would try gabapentin again as can't recall if on same before  Increasing anxiety - started on pristiq by obg after poor tol of other meds - uses prn xanax but ?other non habit forming med  DM2 - follows with endo for same - dx 2010 - no known chronic complications.  she has never been on insulin.  she was unable to tolerate metformin (diarrhea), januvia (headache), byetta (abdominal bruising),  actos (edema), and onglyza.  she now takes metformin, glipizide and bromocrip. states cbg's are well-controlled. pt says her diet and exercise are good. Notes continued B feet burning sensation  HTN - reports compliance with ongoing medical treatment and no changes in medication dose or frequency. denies adverse side effects related to current therapy. would prefer to have generics for cost control if possible - chronic LE edema  Dyslipidemia - prev rx wth crestor, lipitor and zocor poorly tolerated due to myalgias - never tried lower dose or qod dosing -  GERD - reports compliance with ongoing medical treatment and no changes in medication dose or frequency. denies adverse side effects related to current therapy.  planning to try baking soda with omeprazole for cost savings in place of zegrid  Ulcerative colitis -quiet symptoms at this time - follows with GI for same - reports compliance with ongoing medical treatment and no changes in medication dose or frequency. denies adverse side effects related to current therapy.   Episodic CP "fluttering", usually at night when still 2 episodes since completion of stress test 06/19/10 (normal) always occurs at rest - nonexertional, not  positional Improved with prn xanax- requests refill   Past Medical History  Diagnosis Date  . MENOPAUSAL DISORDER   . REDUCTION MAMMOPLASTY, HX OF 1994    bilateral  . ULCERATIVE COLITIS   . HYPERTENSION   . GERD   . DIABETES MELLITUS, TYPE II      Review of Systems  Constitutional: Negative for fever.  Genitourinary: Negative for dysuria.  Neurological: Negative for syncope.       Objective:   Physical Exam  Constitutional: She appears well-developed and well-nourished.  Cardiovascular: Normal rate, regular rhythm and normal heart sounds.   Pulmonary/Chest: Effort normal and breath sounds normal. No respiratory distress.  Psychiatric: She has a normal mood and affect. Her behavior is normal. Thought content normal.   BP 128/80  Pulse 73  Temp(Src) 98.3 F (36.8 C) (Oral)  Ht 5' 2"  (1.575 m)  Wt 184 lb (83.462 kg)  BMI 33.65 kg/m2  SpO2 97% Lab Results  Component Value Date   WBC 8.8 10/11/2010   HGB 15.1* 10/11/2010   HCT 43.4 10/11/2010   PLT 234.0 10/11/2010   CHOL 175 01/24/2011   TRIG 538.0* 01/24/2011   HDL 40.30 01/24/2011   LDLDIRECT 69.5 01/24/2011   ALT 27 10/11/2010   AST 27 10/11/2010   NA 139 10/11/2010   K 3.8 10/11/2010   CL 95* 10/11/2010   CREATININE 0.7 10/11/2010   BUN 17 10/11/2010   CO2 26 10/11/2010   TSH 1.12 10/11/2010   HGBA1C 7.4* 01/24/2011   MICROALBUR 1.1 05/29/2010  Assessment & Plan:  See problem list. Medications and labs reviewed today.

## 2011-05-30 NOTE — Assessment & Plan Note (Signed)
Chronic issues, exac by complicated aging of father and father in law Intolerant of cymbalta, traz, Madelin Headings, effexor trials - now on pristique + xanax Requests something non habit forming to replace xanax - add buspar bid but continue prn xanax

## 2011-05-30 NOTE — Patient Instructions (Addendum)
blood tests are being requested for you today.  please call [phone] to hear your test results.  You will be prompted to enter the 9-digit "MRN" number that appears at the top left of this page, followed by #.  Then you will hear the message. pending the test results, please: Change tradjenta to victoza--increase every few days, up to 1.8 mg/day Decrease glipizide to 2.5 mg each morning check your blood sugar 1 time a day.  vary the time of day when you check, between before the 3 meals, and at bedtime.  also check if you have symptoms of your blood sugar being too high or too low.  please keep a record of the readings and bring it to your next appointment here.  please call us sooner if you are having low blood sugar episodes. Please make a follow-up appointment in 3 months.

## 2011-06-12 ENCOUNTER — Ambulatory Visit: Payer: Medicare Other | Admitting: Neurology

## 2011-06-26 ENCOUNTER — Ambulatory Visit (INDEPENDENT_AMBULATORY_CARE_PROVIDER_SITE_OTHER): Payer: Medicare Other | Admitting: Neurology

## 2011-06-26 ENCOUNTER — Encounter: Payer: Self-pay | Admitting: Neurology

## 2011-06-26 ENCOUNTER — Other Ambulatory Visit (INDEPENDENT_AMBULATORY_CARE_PROVIDER_SITE_OTHER): Payer: Medicare Other

## 2011-06-26 DIAGNOSIS — G43909 Migraine, unspecified, not intractable, without status migrainosus: Secondary | ICD-10-CM

## 2011-06-26 DIAGNOSIS — G609 Hereditary and idiopathic neuropathy, unspecified: Secondary | ICD-10-CM

## 2011-06-26 DIAGNOSIS — E119 Type 2 diabetes mellitus without complications: Secondary | ICD-10-CM

## 2011-06-26 LAB — COMPREHENSIVE METABOLIC PANEL
Alkaline Phosphatase: 34 U/L — ABNORMAL LOW (ref 39–117)
BUN: 13 mg/dL (ref 6–23)
Glucose, Bld: 130 mg/dL — ABNORMAL HIGH (ref 70–99)
Total Bilirubin: 0.6 mg/dL (ref 0.3–1.2)

## 2011-06-26 LAB — CBC WITH DIFFERENTIAL/PLATELET
Basophils Absolute: 0 10*3/uL (ref 0.0–0.1)
Eosinophils Absolute: 0.1 10*3/uL (ref 0.0–0.7)
HCT: 42.3 % (ref 36.0–46.0)
Lymphs Abs: 2.8 10*3/uL (ref 0.7–4.0)
MCHC: 34.3 g/dL (ref 30.0–36.0)
MCV: 95.8 fl (ref 78.0–100.0)
Monocytes Absolute: 0.5 10*3/uL (ref 0.1–1.0)
Platelets: 210 10*3/uL (ref 150.0–400.0)
RDW: 13.2 % (ref 11.5–14.6)

## 2011-06-26 LAB — HEMOGLOBIN A1C: Hgb A1c MFr Bld: 7.2 % — ABNORMAL HIGH (ref 4.6–6.5)

## 2011-06-26 MED ORDER — GABAPENTIN 100 MG PO CAPS: 100.0000 mg | ORAL_CAPSULE | Freq: Three times a day (TID) | ORAL | Status: DC

## 2011-06-26 NOTE — Patient Instructions (Signed)
Go the basement to have your labs drawn today.  Your prescription has been sent to your pharmacy.  Your MRI and MRA's have been scheduled at [facility] on Wednesday, October 3rd at 12:00noon.  Please arrive by 11:45am  Your nerve conduction/electromyelogram is scheduled for Monday, October 8th at 11:15am  [facility] [place]  [phone].

## 2011-06-26 NOTE — Progress Notes (Signed)
Dear Dr. Asa Lente,  Thank you for having me see [name] in consultation today at [facility] for her problem with possible neuropathy.  As you may recall, she is a 65 y.o. year old female with a history of diabetes and ulcerative colitis who has had a 15 year history of progressive numbness and pain in her feet.  She says she first noticed this when she had some ingrown toe nails fixed.  She had a subsequent tarsal tunnel release on the left which at first seemed to help.  The pain has gotten worse where it is difficult for her stand for any length of time.  She cannot sleep with her legs together because this makes the pain worse.  She has been tried on Elavil before but this caused sedation.  Apparently from review of the notes pregabalin was also tried.  You just started the patient on a low dose of gabapentin.  She has not noticed a difference at this dose.  Incidentally she mentions about 6 months ago she had a spell of difficulty speaking that lasted minutes followed by headache.  It has never recurred.  A CT head was reportedly unremarkable, although I do not have the report.  She does have a remote history of "severe" headaches.  Medical History: Diabetes mellitus, diagnosed for 3 years.  Previous fasting bs were reportedly normal.  Ulcerative colitis previously treated with 5-ASA., HTN.   Surgical History: Cholecystecomy, Hysterectomy, Carpal Tunnel Syndrome, right hand.  Also lumbar spine surgery for herniated disk.  Multiple right knee surgeries.  Tarsal tunnel release.   Social History: Social EtOH, no tob.  Retired Pharmacist, hospital.  Family History: Seizures in a sibling.  Neuropathy.  Dementia.     ROS:  13 systems were reviewed and are notable for chronic knee.  All other review of systems are unremarkable.   Examination:  Filed Vitals:   06/26/11 1026  BP: 138/80  Pulse: 76  Weight: 187 lb (84.823 kg)     In general, obese woman in NAD.  Cardiovascular: The  patient has a regular rate and rhythm and no carotid bruits.  Fundoscopy:  Disks are flat. Vessel caliber within normal limits.  No diabetic changes.  Mental status:   The patient is oriented to person, place and time. Recent and remote memory are intact. Attention span and concentration are normal. Language including repetition, naming, following commands are intact. Fund of knowledge of current and historical events, as well as vocabulary are normal.  Cranial Nerves: Pupils are equally round and reactive to light. Visual fields full to confrontation. Extraocular movements are intact without nystagmus. Facial sensation and muscles of mastication are intact. Muscles of facial expression are symmetric. Hearing intact to bilateral finger rub. Tongue protrusion, uvula, palate midline.  Shoulder shrug intact  Motor:  The patient has normal bulk and tone, no pronator drift and 5/5 strength bilaterally.  There are no adventitious movements.  Reflexes:  Are 2+ bilaterally in both the upper and lower extremities and 1+ at the ankles.  She has a positive jaw jerk.  Toes are down.  Coordination:  Normal finger to nose.  No dysdiadokinesia.  Sensation is decreased to temperature to upper calf bilaterally and to MCP bilaterally in hands.  Decreased to vibration(4) in toes (6) in hands.  Gait and Station are normal.  Tandem gait is intact.  Romberg is negative.   Impression:   65 year old with diabetes who has progressive sensory loss and pain in feet.  Exam localizes to peripheral nerves.  Likely diabetes related.  However, one wonders about a nutritional cause given her UC diagnosis.  Also a spell of aphasia that is very concerning.  While the spell could be headache related I am going to work it up as  TIA.   Recommendations:  1.  ?Peripheral neuropathy - I am going to increase her GBP to 100 tid.  I am going to get standard screening labs for PN as well as an EMG/NCS to ensure it is axonal in  nature. 2.  Spell of aphasia - I would recommend that you order an echocardiogram if it makes sense.  I am going to get a an MRI brain and MRA head and neck.   We will see the patient back in 4-6 weeks.  Thank you for having [place] see BARB SHEAR in consultation.  Feel free to contact me with any questions.  Kavin Leech Jacelyn Grip, MD [facility],  [address], [place] [address] Phone: [phone] Fax: [phone].

## 2011-06-28 LAB — PROTEIN ELECTROPHORESIS, SERUM
Beta 2: 5.1 % (ref 3.2–6.5)
Beta Globulin: 6.5 % (ref 4.7–7.2)
Total Protein, Serum Electrophoresis: 6.7 g/dL (ref 6.0–8.3)

## 2011-07-03 ENCOUNTER — Other Ambulatory Visit: Payer: Medicare Other

## 2011-07-03 ENCOUNTER — Telehealth: Payer: Self-pay

## 2011-07-03 NOTE — Telephone Encounter (Signed)
Please re-try the victoza at the lowest (0.6) dosage

## 2011-07-03 NOTE — Progress Notes (Signed)
Reviewed results of her blood work.  Potassium was noted to be low and she was told to follow up with her PCP about supplementation and retesting.  CRP was slightly high, but I assume this is her UC.  HbA1C was over 7.   Importantly  b12 and mma were normal.  I will consider SERUM IFE at next visit, but I think this is unlikely to be high yield.  Await results of her MRI and EMG/NCS.

## 2011-07-03 NOTE — Telephone Encounter (Signed)
Pt informed of lab results. Pt wanted to let MD know that she was unable to tolerated Victoza injection. She states that it caused her heart to race and nausea.

## 2011-07-03 NOTE — Telephone Encounter (Signed)
Pt informed of MD's advisement.

## 2011-07-03 NOTE — Telephone Encounter (Signed)
A1c=7.2--good.  Same rx

## 2011-07-03 NOTE — Telephone Encounter (Signed)
Pt called requesting result of recent labs.

## 2011-07-05 ENCOUNTER — Telehealth: Payer: Self-pay | Admitting: Neurology

## 2011-07-05 NOTE — Telephone Encounter (Signed)
Pt is concerned about the cost of her MRA/MRI scans scheduled on 07/10/11. Can she do one without the other for now? She also inquired about a "whole body" scan and wondered if that would be cheaper.

## 2011-07-08 NOTE — Telephone Encounter (Signed)
[name].  could you call [name]?  Re message about scan below.  She really needs them both done the MRA and MRI.  A "whole body scan" is not going to give [place] the information we need unfortunately.

## 2011-07-08 NOTE — Telephone Encounter (Signed)
Spoke with pt and she said she actually wanted to know if there were any other scans you wanted to do while she was having this done, since it is getting toward the end of the year with her insurance.  She went for her NCS/EMG and she said they told her it looked like she may have a problem with her back which she has not had checked since her surgery.

## 2011-07-08 NOTE — Telephone Encounter (Signed)
spoke to patient about results.  no need for MRI l-spine now.  she is going to proceed with MRA head and neck and MRI brain.  She will increase neurontin to 236m tid, and call if she is still having numbness and tingling.  at that point I can have her increase to 300 tid and call in a script for larger pills.

## 2011-07-10 ENCOUNTER — Ambulatory Visit
Admission: RE | Admit: 2011-07-10 | Discharge: 2011-07-10 | Disposition: A | Discharge status: Home / Self Care | Payer: Medicare Other | Source: Ambulatory Visit | Attending: Neurology | Admitting: Neurology

## 2011-07-10 DIAGNOSIS — G609 Hereditary and idiopathic neuropathy, unspecified: Secondary | ICD-10-CM

## 2011-07-10 MED ORDER — GADOBENATE DIMEGLUMINE 529 MG/ML IV SOLN
18.0000 mL | Freq: Once | INTRAVENOUS | Status: AC | PRN
  Administered 2011-07-10: 18 mL via INTRAVENOUS

## 2011-07-14 ENCOUNTER — Encounter: Payer: Self-pay | Admitting: Neurology

## 2011-07-16 ENCOUNTER — Other Ambulatory Visit: Payer: Self-pay | Admitting: Neurology

## 2011-07-16 ENCOUNTER — Telehealth: Payer: Self-pay

## 2011-07-16 MED ORDER — GABAPENTIN 300 MG PO CAPS: 300.0000 mg | ORAL_CAPSULE | Freq: Three times a day (TID) | ORAL | Status: DC

## 2011-07-16 MED ORDER — ASPIRIN EC 325 MG PO TBEC: 325.0000 mg | DELAYED_RELEASE_TABLET | Freq: Every day | ORAL | Status: AC

## 2011-07-16 NOTE — Progress Notes (Signed)
Patient's MRA head and neck, MRI brain unremarkable.  Still having parasthesias.  Will increase neurontin to 300 tid.  Have sent in script.

## 2011-07-16 NOTE — Progress Notes (Addendum)
Reviewed MRA head and neck and brain results.  No obvious focal stenosis that would explain speech abnormality.  WMD but no clear ischemic strokes although slightly atypical white matter lesion in anterior portion of right putamen.    Patient is on aspirin 321m daily.

## 2011-07-16 NOTE — Telephone Encounter (Signed)
Pt calling for results of her latest scans and NCS/EMG.

## 2011-07-16 NOTE — Progress Notes (Signed)
Got results of her NCS/EMG 07/08/2011.  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  Results sent to be scanned.

## 2011-07-18 NOTE — Telephone Encounter (Signed)
spoke to Ms. Vermette.  Given results.  Called in new script for increased dose of neurontin.

## 2011-07-23 ENCOUNTER — Telehealth: Payer: Self-pay | Admitting: Neurology

## 2011-07-23 NOTE — Telephone Encounter (Signed)
Pt states that the neurontin seems to be helping feet, but fingertips on both hands are going numb. Problem is progressively getting worse.

## 2011-07-23 NOTE — Telephone Encounter (Signed)
patient feels that neurontin is making hands numb.  I had her stop it for 4 days and see if it improves.  if it does we can try pregabalin, if not we can continue to use neurontin.

## 2011-07-29 ENCOUNTER — Telehealth: Payer: Self-pay | Admitting: Neurology

## 2011-07-29 MED ORDER — AMITRIPTYLINE HCL 25 MG PO TABS: 25.0000 mg | ORAL_TABLET | Freq: Every day | ORAL | Status: DC

## 2011-07-29 NOTE — Telephone Encounter (Signed)
Patient has numbness in her right hand, got a bit better with stopping neurontin.  EMG/NCS revealed right ulnar neuropathy.    I have called in elavil 38m qhs for her to try for her neuropathic pain.

## 2011-08-01 ENCOUNTER — Telehealth (INDEPENDENT_AMBULATORY_CARE_PROVIDER_SITE_OTHER): Payer: Self-pay | Admitting: General Surgery

## 2011-08-01 ENCOUNTER — Telehealth: Payer: Self-pay | Admitting: Neurology

## 2011-08-01 NOTE — Telephone Encounter (Signed)
Spoke with pt who says she started taking the elavil on Monday and yesterday and today she is extremely dizzy and nauseous.  Taking with a snack at night.

## 2011-08-01 NOTE — Telephone Encounter (Signed)
Having a Flare, Please call [phone]

## 2011-08-02 ENCOUNTER — Telehealth (INDEPENDENT_AMBULATORY_CARE_PROVIDER_SITE_OTHER): Payer: Self-pay | Admitting: *Deleted

## 2011-08-02 NOTE — Telephone Encounter (Signed)
Pt aware and will d/c elavil to see if symptoms go away.  She already has a f/u scheduled.

## 2011-08-02 NOTE — Telephone Encounter (Signed)
Update from previous telephone encounter. Per Dr. Laural [name] call in Cort enemas 100 mg 1 per rectal every night for 2 weeks. The patient will need a office appointment during this time.  I [facility] [address]. The prescription for the prednisone was canceled. Message forwarded to Butch Penny to arrange an appointment. Dr. Laural [name] off 08-05-11 , the patient ask if we could make the appointment 08-12-11.She is willing to see Terri while Laural [name] is in office if there is nowhere for her to see Dr. Laural [name].

## 2011-08-02 NOTE — Telephone Encounter (Signed)
On 08-01-11 I talked with the patient and she states that she has been having a flare for 2 1/2 weeks. She is having bowel movements 8 times or more a day. Describes them as milky and bright red blood at the end of the BM, as well as upper right quadrant pain. I spoke with Lelon Perla, and she prescribed Prednisone 40 mg titrating down by 10 mg each week, patient may take Imodium BID. This was called to [place], patient to call us with a progress report Monday. Patient was called and made aware, her response was again. I told Mrs.Peggs that I would contact Dr. Laural [name] and review with him. Dr. Laural [name] was paged.   Patient was contacted prior to 5:30 pm and told that this  would be reviewed with Dr. Laural [name]  08-02-11 , reminded her that the medication had been called in.

## 2011-08-02 NOTE — Telephone Encounter (Signed)
It is unusual for Elavil to cause this, but she can stop it and see if its gets better.  She should book a f/u appt so we can discuss other options at length.  Thx.

## 2011-08-05 NOTE — Telephone Encounter (Signed)
Spoke with patient and scheduled an appt. On 08/12/11 at 10:45 am.

## 2011-08-12 ENCOUNTER — Ambulatory Visit (INDEPENDENT_AMBULATORY_CARE_PROVIDER_SITE_OTHER): Payer: Medicare Other | Admitting: Internal Medicine

## 2011-08-12 ENCOUNTER — Encounter (INDEPENDENT_AMBULATORY_CARE_PROVIDER_SITE_OTHER): Payer: Self-pay | Admitting: Internal Medicine

## 2011-08-12 ENCOUNTER — Other Ambulatory Visit (INDEPENDENT_AMBULATORY_CARE_PROVIDER_SITE_OTHER): Payer: Self-pay | Admitting: Internal Medicine

## 2011-08-12 VITALS — BP 168/62 | Temp 98.4°F | Ht 62.0 in | Wt 186.4 lb

## 2011-08-12 DIAGNOSIS — K5190 Ulcerative colitis, unspecified, without complications: Secondary | ICD-10-CM

## 2011-08-12 NOTE — Progress Notes (Signed)
Subjective:     Patient ID: [name], female   DOB: 1945/11/14, 65 y.o.   MRN: [id number]  HPI [name] is a 65 yr old female presenting today for f/u of her UC. She was diagnosed with UC about 25 yrs ago by Dr. Laural [name]. She says today she is having a flare.She says she can tell she is having a flare.  She feels gassy and bloated.  She says she has diarrhea and has mucous.  She has seven more doses left.  There is some rectal bleeding at times.  No rectal pain. She has had this flare for about 2 weeks. She is not taking any medications for her UC.  She is having about 4-5 loose stools a day. Symptoms are better since starting the enema.  Appetite is good. No weight loss. Started on Hydrocortisone about 2 weeks ago.    Review of Systems see hpi     Objective:   Physical ExamAlert and oriented. Skin warm and dry. Oral mucosa is moist. Natural teeth in good condition. Sclera anicteric, conjunctivae is pink. Thyroid not enlarged. No cervical lymphadenopathy. Lungs clear. Heart regular rate and rhythm.  Abdomen is soft. Bowel sounds are positive. No hepatomegaly. No abdominal masses felt. No tenderness.  No edema to lower extremities. Patient is alert and oriented.      Assessment:  Probable UC flare.  I discussed this case with Dr. Laural [name].       UC flare   Plan:     Follow up in 3 months. CRP today. Continue the Hydrocortisone enemas.  Promyse is satisfied with the care she received today.

## 2011-08-12 NOTE — Patient Instructions (Signed)
Continue Hydrocortisone enemas. Will get a CRP troday and she will have an OV in 3 months.

## 2011-08-13 ENCOUNTER — Ambulatory Visit: Payer: Medicare Other | Admitting: Neurology

## 2011-08-13 LAB — C-REACTIVE PROTEIN: CRP: 0.68 mg/dL — ABNORMAL HIGH (ref <0.60)

## 2011-08-14 ENCOUNTER — Encounter: Payer: Self-pay | Admitting: Neurology

## 2011-08-14 ENCOUNTER — Ambulatory Visit (INDEPENDENT_AMBULATORY_CARE_PROVIDER_SITE_OTHER): Payer: Medicare Other | Admitting: Neurology

## 2011-08-14 VITALS — BP 140/78 | HR 72 | Wt 190.0 lb

## 2011-08-14 DIAGNOSIS — G629 Polyneuropathy, unspecified: Secondary | ICD-10-CM

## 2011-08-14 DIAGNOSIS — G609 Hereditary and idiopathic neuropathy, unspecified: Secondary | ICD-10-CM

## 2011-08-14 NOTE — Progress Notes (Signed)
Dear Dr. Edrick Oh,  I saw  [name] back in [facility] for her problem with peripheral neuropathy.  As you may recall, she is a 65 y.o. year old female with a history of ulcerative colitis and diabetes who has painful dysesthesias in her feet.  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  I felt the neuropathy was likely mainly due to her diabetes and ulcerative colitis.  When I started her on Neurontin and got her to a dose of 339m she coincidentally had worsening with tingling in her hands, right greater than left.  We stopped the Neurontin which did not seem to help the tingling in the hands.  We attempted to start Elavil but at 251mshe felt foggy.  Incidentally, she also had a flair of her UC in the meantime and is on steroid enemas.  Interestingly she now feels the tingling in her hands is better, but worse burning in the feet off the neurontin.    Medical history, social history, family history, medications and allergies were reviewed and have not changed except mentioned above since the last clinic vist.  ROS:  13 systems were reviewed and are notable for chronic right knee pain, diarrhea now resolving..  All other review of systems are unremarkable.  Exam: . Filed Vitals:   08/14/11 1048  BP: 140/78  Pulse: 72  Weight: 190 lb (86.183 kg)    Cranial Nerves: Extraocular movements are intact without nystagmus. Facial sensation and muscles of mastication are intact. Muscles of facial expression are symmetric.   Shoulder shrug intact  Motor:  Normal bulk and tone, no drift and 5/5 muscle strength bilaterally.  Reflexes:  2+ thoughout, 1+ at feet toes down  Sensation decreased to temperature in feet bilaterally, ?decreased in hands.   Gait:  Antalgic gait and station.  Romberg negative.  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   I am concerned that her flare of UC may related to her worsening of the tingling in her hands.  It is also possible this is related to her mild ulnar neuropathy as well.  Recommendations:  I am going to start the patient back on neurontin as I am not convinced that the neurontin caused worsening of her symptoms in her hands.  She will let me know if it worsens.  In that case we can retry the Elavil again as I suspect she just needs to acclimatize to its effects.  I have asked her to speak to her gastroenterologist Dr. ReLaural Goldenbout possibly starting an immune modulating agent as there is some evidence that this may help there peripheral neuropathy associated with UC.  Given the degree of discomfort that she has in her feet I believe that consideration of this may be worthwhile.  We will see the patient back in 6 weeks.  MaKavin LeechoJacelyn GripMD [facility], [place]

## 2011-08-15 ENCOUNTER — Ambulatory Visit: Payer: Medicare Other | Admitting: Endocrinology

## 2011-09-09 ENCOUNTER — Ambulatory Visit (INDEPENDENT_AMBULATORY_CARE_PROVIDER_SITE_OTHER): Payer: Medicare Other | Admitting: Internal Medicine

## 2011-09-12 NOTE — Progress Notes (Signed)
This has been addressed.

## 2011-09-16 ENCOUNTER — Other Ambulatory Visit: Payer: Self-pay | Admitting: Obstetrics & Gynecology

## 2011-09-16 DIAGNOSIS — Z139 Encounter for screening, unspecified: Secondary | ICD-10-CM

## 2011-09-19 ENCOUNTER — Telehealth (INDEPENDENT_AMBULATORY_CARE_PROVIDER_SITE_OTHER): Payer: Self-pay | Admitting: *Deleted

## 2011-09-19 ENCOUNTER — Ambulatory Visit (HOSPITAL_COMMUNITY): Payer: Medicare Other

## 2011-09-19 DIAGNOSIS — R197 Diarrhea, unspecified: Secondary | ICD-10-CM

## 2011-09-19 NOTE — Telephone Encounter (Signed)
Per Dr. Laural [name] the patient needs a C- Diff and then she is to start Flagyl 250 mg Four times a day.Patient to be made aware.

## 2011-09-19 NOTE — Telephone Encounter (Signed)
[name] called in . She is having a flare. She has recently been treated with antibiotic and cough medicine with codeine. She c/o bloating,gas,milky discharge, and liquid stool. I told her that I would make Dr. Laural [name] aware and then call her back with his recommendations.

## 2011-09-26 ENCOUNTER — Other Ambulatory Visit (INDEPENDENT_AMBULATORY_CARE_PROVIDER_SITE_OTHER): Payer: Self-pay | Admitting: Internal Medicine

## 2011-10-03 ENCOUNTER — Ambulatory Visit: Payer: Medicare Other | Admitting: Neurology

## 2011-10-03 ENCOUNTER — Ambulatory Visit (HOSPITAL_COMMUNITY)
Admission: RE | Admit: 2011-10-03 | Discharge: 2011-10-03 | Disposition: A | Discharge status: Home / Self Care | Payer: Medicare Other | Source: Ambulatory Visit | Attending: Obstetrics & Gynecology | Admitting: Obstetrics & Gynecology

## 2011-10-03 DIAGNOSIS — Z139 Encounter for screening, unspecified: Secondary | ICD-10-CM

## 2011-10-03 DIAGNOSIS — Z1231 Encounter for screening mammogram for malignant neoplasm of breast: Secondary | ICD-10-CM | POA: Diagnosis not present

## 2011-10-04 ENCOUNTER — Telehealth (INDEPENDENT_AMBULATORY_CARE_PROVIDER_SITE_OTHER): Payer: Self-pay | Admitting: *Deleted

## 2011-10-04 DIAGNOSIS — I10 Essential (primary) hypertension: Secondary | ICD-10-CM | POA: Diagnosis not present

## 2011-10-04 DIAGNOSIS — E785 Hyperlipidemia, unspecified: Secondary | ICD-10-CM | POA: Diagnosis not present

## 2011-10-04 MED ORDER — HYDROCORTISONE 100 MG/60ML RE ENEM: 100.0000 mg | ENEMA | Freq: Every day | RECTAL | Status: DC

## 2011-10-04 NOTE — Telephone Encounter (Signed)
[facility] [place]

## 2011-10-04 NOTE — Telephone Encounter (Signed)
Patient called and left a message on my voice mail, states that she is having back pain with this flare of UC. She is currently taking Cipro for this after stool came back negative for C- DIff. Forwarded to Deberah Castle, NP to address. Terri she states that she can be reached at [phone]

## 2011-10-04 NOTE — Telephone Encounter (Signed)
C/o passing mucous blood stools.  C-diff was negative. She says she is having a UC flare. I am going to call in an Rx for Hydrocortisone enemas x 30 days with [facility] [place].

## 2011-10-30 ENCOUNTER — Telehealth: Payer: Self-pay | Admitting: Neurology

## 2011-10-30 NOTE — Telephone Encounter (Signed)
Pt spoke to GI doc per Dr. Jodi Mourning instructions. GI says he has never seen a case where Ulcerative Colitus causing the trouble with her feet, but he wouldn't rule it out. Pt states that she is experiencing stabbing pains in feet and numbness in her fingertips. Cxld previous fu appt. Pt is currently scheduled for 12/05/11.

## 2011-10-30 NOTE — Telephone Encounter (Signed)
Called and spoke with the patient. She c/o numbness in the tips of her fingers bilaterally; R > L. Does report her feet feel better overall with just an occasional, stabbing pain. She is taking Neurontin 100 mg TID. She also reports that she has one more week of the steroid enemas for her ulcerative colitis but that is now in remission. She is wondering if there is anything else she can do for this discomfort in her hands/feet. Patient has a f/u with Dr. Jacelyn Grip in March. She is on the cancellation list as well. **Dr. Jacelyn Grip, please advise.

## 2011-11-01 NOTE — Telephone Encounter (Signed)
She can try a higher dose of the Neurontin again if she is willing to do that.  We can increase it to 200 tid and then 300 tid.

## 2011-11-01 NOTE — Telephone Encounter (Signed)
Called and spoke with the patient. Information given as directed by Dr. Jacelyn Grip re: Neurontin increase. The patient will go up slowly to achieve 200 mg TID and then call us with an update next week. She agreed with this plan.

## 2011-11-13 ENCOUNTER — Ambulatory Visit (INDEPENDENT_AMBULATORY_CARE_PROVIDER_SITE_OTHER): Payer: Medicare Other | Admitting: Internal Medicine

## 2011-11-13 ENCOUNTER — Encounter (INDEPENDENT_AMBULATORY_CARE_PROVIDER_SITE_OTHER): Payer: Self-pay | Admitting: Internal Medicine

## 2011-11-13 VITALS — BP 130/80 | HR 66 | Temp 98.5°F | Ht 62.0 in | Wt 184.4 lb

## 2011-11-13 DIAGNOSIS — K5190 Ulcerative colitis, unspecified, without complications: Secondary | ICD-10-CM | POA: Diagnosis not present

## 2011-11-13 DIAGNOSIS — K5120 Ulcerative (chronic) proctitis without complications: Secondary | ICD-10-CM | POA: Diagnosis not present

## 2011-11-13 NOTE — Progress Notes (Addendum)
Subjective:     Patient ID: [name], female   DOB: Jun 01, 1946, 66 y.o.   MRN: [id number]  HPI  [name] is her for a scheduled visit for a UC. She was diagnosed with UC about 25 yrs ago by Dr. Laural [name].  She tells me she is doing good.  Her last flare was in January of this year. She was treated with Hydrocortisone enemas.  She also was covered with Flagyl  She is presently having two stools a day. She usually has 8-10 stools a day. No melena or bright red rectal bleeding. No mucous.  She is having trouble holding the stool. She has urgency.  She is not taking medications for her UC. Appetite is good. No weight loss.  She does have frequent acid reflux.  She has been on several UC medications but was intolerant.  No abdominal pain   Review of Systems see hpi Current Outpatient Prescriptions  Medication Sig Dispense Refill  . ALPRAZolam (XANAX) 0.25 MG tablet Take 0.25 mg by mouth. prn       . Chromium Picolinate 500 MCG TABS Take by mouth.        . desvenlafaxine (PRISTIQ) 50 MG 24 hr tablet Take 50 mg by mouth daily.        Marland Kitchen esomeprazole (NEXIUM) 40 MG capsule Take 40 mg by mouth daily.        Marland Kitchen estradiol (ESTRACE) 1 MG tablet Take 1 mg by mouth daily.        Marland Kitchen gabapentin (NEURONTIN) 100 MG capsule Take 300 mg by mouth 3 (three) times daily.      Marland Kitchen glucosamine-chondroitin 500-400 MG tablet Take 1 tablet by mouth daily.      Marland Kitchen glucose blood test strip And lancets 1 daily.  250.00  100 each  3  . hydrochlorothiazide 50 MG tablet Take 1 tablet (50 mg total) by mouth daily.  90 tablet  3  . linagliptin (TRADJENTA) 5 MG TABS tablet Take 5 mg by mouth daily.        . metoprolol (TOPROL-XL) 100 MG 24 hr tablet 1 tablet by mouth every morning, 1/2 tablet every evening       . Multiple Vitamins-Minerals (CENTRUM SILVER PO) Take 1 tablet by mouth daily.        Marland Kitchen amitriptyline (ELAVIL) 25 MG tablet Take 1 tablet (25 mg total) by mouth at bedtime. start with 1/2 tab at night for 1 week then increase to  1 whole tab.  30 tablet  3  . bromocriptine (PARLODEL) 2.5 MG tablet Take 2.5 mg by mouth 2 (two) times daily.        . busPIRone (BUSPAR) 10 MG tablet Take 1 tablet (10 mg total) by mouth 2 (two) times daily.  60 tablet  2  . glipiZIDE (GLUCOTROL XL) 2.5 MG 24 hr tablet Take 5 mg by mouth daily.        Marland Kitchen DISCONTD: glipiZIDE (GLIPIZIDE XL) 2.5 MG 24 hr tablet Take 1 tablet (2.5 mg total) by mouth daily.  30 tablet  11   . Past Medical History  Diagnosis Date  . MENOPAUSAL DISORDER   . REDUCTION MAMMOPLASTY, HX OF 1994    bilateral  . ULCERATIVE COLITIS   . HYPERTENSION   . GERD   . DIABETES MELLITUS, TYPE II   . Ulcerative colitis    Past Surgical History  Procedure Date  . Abdominal hysterectomy 1980    total, with bladder tack  . Breast surgery 1994  Bilateral, due to fibrocystic breast changes  . Rectocele repair 1990  . Carpal tunnel release 1996    Right  . Right knee arthroscopy 01/1999    then again 10/2000  . L4-l5 diskectomy 08/1999  . Cholecystectomy    History   Social History  . Marital Status: Married    Spouse Name: N/A    Number of Children: N/A  . Years of Education: N/A   Occupational History  . Not on file.   Social History Main Topics  . Smoking status: Never Smoker   . 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 Alcohol Use: Yes     Rare  . Drug Use: No  . Sexually Active: Not on file   Other Topics Concern  . Not on file   Social History Narrative  . No narrative on file   No family status information on file.   Allergies  Allergen Reactions  . Chloraseptic   . Colesevelam     REACTION: gi sxs  . Nisoldipine   . Oxycodone-Acetaminophen     REACTION: Itch  . Prilosec (Omeprazole)        Objective:   Physical Exam. Filed Vitals:   11/13/11 1542  Height: 5' 2"  (1.575 m)  Weight:  184 lb 6.4 oz (83.643 kg)   Alert and oriented. Skin warm and dry. Oral mucosa is moist.   . Sclera anicteric, conjunctivae is pink. Thyroid not enlarged. No cervical lymphadenopathy. Lungs clear. Heart regular rate and rhythm.  Abdomen is soft. Bowel sounds are positive. No hepatomegaly. No abdominal masses felt. No tenderness.  No edema to lower extremities. Patient is alert and oriented.      Assessment:  UC which seems to be in remission at this time.      Plan:     CRP  today and Ov IN 3 MONTHS.

## 2011-11-13 NOTE — Patient Instructions (Signed)
CRP today and OV in 3 months

## 2011-11-14 LAB — C-REACTIVE PROTEIN: CRP: 0.84 mg/dL — ABNORMAL HIGH (ref <0.60)

## 2011-11-25 DIAGNOSIS — E785 Hyperlipidemia, unspecified: Secondary | ICD-10-CM | POA: Diagnosis not present

## 2011-11-25 DIAGNOSIS — I10 Essential (primary) hypertension: Secondary | ICD-10-CM | POA: Diagnosis not present

## 2011-11-28 ENCOUNTER — Ambulatory Visit: Payer: Medicare Other | Admitting: Endocrinology

## 2011-11-28 ENCOUNTER — Ambulatory Visit: Payer: Medicare Other | Admitting: Internal Medicine

## 2011-12-05 ENCOUNTER — Ambulatory Visit (INDEPENDENT_AMBULATORY_CARE_PROVIDER_SITE_OTHER): Payer: Medicare Other | Admitting: Neurology

## 2011-12-05 ENCOUNTER — Encounter: Payer: Self-pay | Admitting: Neurology

## 2011-12-05 VITALS — BP 126/76 | HR 84 | Wt 186.0 lb

## 2011-12-05 DIAGNOSIS — G609 Hereditary and idiopathic neuropathy, unspecified: Secondary | ICD-10-CM | POA: Diagnosis not present

## 2011-12-05 DIAGNOSIS — G959 Disease of spinal cord, unspecified: Secondary | ICD-10-CM | POA: Diagnosis not present

## 2011-12-05 DIAGNOSIS — G629 Polyneuropathy, unspecified: Secondary | ICD-10-CM

## 2011-12-05 NOTE — Progress Notes (Signed)
Dear Dr. Edrick Oh,  I saw [name] back in [facility] for her problem with peripheral neuropathy. As you may recall, she is a 66 y.o. year old female with a history of ulcerative colitis and diabetes who has painful dysesthesias in her feet. 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   I felt the neuropathy was likely mainly due to her diabetes and ulcerative colitis. When I started her on Neurontin and got her to a dose of 324m she coincidentally had worsening with tingling in her hands, right greater than left. We stopped the Neurontin which did not seem to help the tingling in the hands. We attempted to start Elavil but at 247mshe felt foggy.  At her last visit, I thought that perhaps one should consider treating her UC to see if her neuropathy would get better.  She approached her GI doc who had not heard of a connection between UC and neuropathy.  He did not want to pursue immunosuppressant treatment of her UC(or at least did not think it was worth the risk of starting an immunosuppressant).  I started her back on gabapentin and have gotten her to a dose 100/100/200.  She does not feel that it has helped her burning pain in her hands and feet.  She feels it may have made the hands worse.  She feels like it is hard to pick up items with her hands.  She also feels her balance has gotten worse.  She has had no falls.   Medical history, social history, and family history were reviewed and have not changed since the last clinic visit. Current Outpatient Prescriptions on File Prior to Visit  Medication Sig Dispense Refill  . ALPRAZolam (XANAX) 0.25 MG tablet Take 0.25 mg by mouth. prn       . Chromium Picolinate 500 MCG TABS Take by mouth.        . desvenlafaxine (PRISTIQ) 50 MG 24 hr tablet Take 50 mg by mouth daily.        . Marland Kitchensomeprazole (NEXIUM) 40 MG capsule Take 40 mg by  mouth. One twice daily      . gabapentin (NEURONTIN) 100 MG capsule Take 300 mg by mouth 3 (three) times daily.      . Marland KitchenlipiZIDE (GLUCOTROL XL) 2.5 MG 24 hr tablet Take 10 mg by mouth daily.       . Marland Kitchenlucosamine-chondroitin 500-400 MG tablet Take 1 tablet by mouth daily.      . Marland Kitchenlucose blood test strip And lancets 1 daily.  250.00  100 each  3  . hydrochlorothiazide 50 MG tablet Take 1 tablet (50 mg total) by mouth daily.  90 tablet  3  . linagliptin (TRADJENTA) 5 MG TABS tablet Take 5 mg by mouth daily.        . metoprolol (TOPROL-XL) 100 MG 24 hr tablet 1 tablet by mouth every morning, 1/2 tablet every evening       . estradiol (ESTRACE) 1 MG tablet Take 1 mg by mouth daily.        . Multiple Vitamins-Minerals (CENTRUM SILVER PO) Take 1 tablet by mouth daily.        . Marland KitchenISCONTD: glipiZIDE (GLIPIZIDE XL) 2.5 MG 24 hr tablet Take 1 tablet (2.5 mg total) by mouth daily.  30 tablet  11     Allergies  Allergen Reactions  . Chloraseptic   .  Colesevelam     REACTION: gi sxs  . Nisoldipine   . Oxycodone-Acetaminophen     REACTION: Itch  . Prilosec (Omeprazole)     ROS:  13 systems were reviewed and are notable for difficulty with balance.  All other review of systems are unremarkable.  Exam: . Filed Vitals:   12/05/11 1443  BP: 126/76  Pulse: 84  Weight: 186 lb (84.369 kg)    In general, well appearing women in NAD.    Motor:  Normal bulk and tone, no drift and 5/5 muscle strength bilaterally.  Reflexes:  2+ UE, 2++ knees, 1+ ankles, toes down.  Sensation:  R-S vibration 4 feet, 6 hands  Gait:  Normal gait and station.  Romberg negative.  Impression/Recommendations: 1.  ?Peripheral neuropathy - Her reflexes are relatively brisk particularly at her knees.  I am going to check her C-spine to look for stenosis, although I think this is unlikely.  I have asked her to stop the gabapentin to see if this makes her hands feel better as she keeps asserting that it makes them worse.   If there is no change I would continue to increase the gabapentin, if they do feel better(and I don't know why that would be) I will like try Pamelor as she failed Elavil in the past.  I am also going to talk to talk to my GI colleagues about whether they would try Remicaide to treat the UC and possibly treat the neuropathy.  We will see the patient back in 2 months.  Kavin Leech Jacelyn Grip, MD [facility], [place]

## 2011-12-05 NOTE — Patient Instructions (Addendum)
Your MRI is schedule for Sunday, March 10th at 7:45am.  [facility] [address]  [phone].

## 2011-12-08 ENCOUNTER — Inpatient Hospital Stay: Admission: RE | Admit: 2011-12-08 | Payer: Medicare Other | Source: Ambulatory Visit

## 2011-12-16 ENCOUNTER — Ambulatory Visit
Admission: RE | Admit: 2011-12-16 | Discharge: 2011-12-16 | Disposition: A | Discharge status: Home / Self Care | Payer: Medicare Other | Source: Ambulatory Visit | Attending: Neurology | Admitting: Neurology

## 2011-12-16 DIAGNOSIS — M47812 Spondylosis without myelopathy or radiculopathy, cervical region: Secondary | ICD-10-CM | POA: Diagnosis not present

## 2011-12-16 DIAGNOSIS — R209 Unspecified disturbances of skin sensation: Secondary | ICD-10-CM | POA: Diagnosis not present

## 2011-12-16 DIAGNOSIS — M5020 Other cervical disc displacement, unspecified cervical region: Secondary | ICD-10-CM | POA: Diagnosis not present

## 2011-12-16 DIAGNOSIS — G959 Disease of spinal cord, unspecified: Secondary | ICD-10-CM

## 2011-12-23 ENCOUNTER — Telehealth: Payer: Self-pay | Admitting: Neurology

## 2011-12-23 ENCOUNTER — Other Ambulatory Visit: Payer: Self-pay | Admitting: Neurology

## 2011-12-23 MED ORDER — NORTRIPTYLINE HCL 25 MG PO CAPS: 25.0000 mg | ORAL_CAPSULE | Freq: Every day | ORAL | Status: DC

## 2011-12-23 NOTE — Telephone Encounter (Signed)
Called and spoke with the patient. Aware of new medication and that it was sent to her pharmacy. I asked that she call us in 7-10 days with an update. The patient agreed with this plan. No other issues voiced at this time.

## 2011-12-23 NOTE — Telephone Encounter (Signed)
Message copied by Angelica Pou on Mon Dec 23, 2011 11:08 AM ------      Message from: Clearnce Sorrel      Created: Sun Dec 22, 2011  2:22 PM       Please let Ms. Starkel know that there is some mild arthritis and disk bulges in her neck seen on MRI, but I don't think it explains the numbness and tingling in her hands and feet.

## 2011-12-23 NOTE — Telephone Encounter (Signed)
Called and spoke with the patient. Information given as per Dr. Jacelyn Grip re: MRI results. The patient states she is still having problems; especially with sleeping. Since stopping the Gabapentin the patient feels the sx in her hands are better but the sx in her feet are not. She has trouble getting to sleep with her feet bothering her so much. She wants to know the "next step." [facility] [place] if needed [phone]). The last office note mentions trying another medication. **Dr. Jacelyn Grip please advise. Thank you.

## 2011-12-23 NOTE — Telephone Encounter (Signed)
Let's try Pamelor 26m qhs. 30 day supply, 3 refills.

## 2011-12-26 ENCOUNTER — Telehealth: Payer: Self-pay | Admitting: Neurology

## 2011-12-26 NOTE — Telephone Encounter (Signed)
I would encourage her to stick with the pamelor as the dry mouth usually goes away.  She should give it a couple of weeks.

## 2011-12-26 NOTE — Telephone Encounter (Signed)
Called and spoke with the patient. Information given as per Dr. Jacelyn Grip below. The patient will continue the medicine. I asked that she call with an update in a couple of weeks. She states she will.

## 2011-12-26 NOTE — Telephone Encounter (Signed)
Received a call from the patient. She called to report an extremely dry mouth since starting the Pamelor 4 days ago. She feels that the medicine is helping her symptoms, but the dry mouth is unbearable. She asked what she could do do "fix" this problem and I recommended that she trying increasing her po fluids, chewing gum, hard candy and even OTC remedies may be helpful. The patient states that she just can't tolerate it and reports that it even wakes her up at night. "I feel like I can't even swallow." She wants to try another med like this one but without the side effects. I told her I would let Dr. Jacelyn Grip know and we would be in touch. **Dr. Jacelyn Grip, please advise. Thank you.

## 2011-12-30 ENCOUNTER — Telehealth (INDEPENDENT_AMBULATORY_CARE_PROVIDER_SITE_OTHER): Payer: Self-pay | Admitting: *Deleted

## 2011-12-30 DIAGNOSIS — Z862 Personal history of diseases of the blood and blood-forming organs and certain disorders involving the immune mechanism: Secondary | ICD-10-CM

## 2011-12-30 HISTORY — DX: Personal history of diseases of the blood and blood-forming organs and certain disorders involving the immune mechanism: Z86.2

## 2011-12-30 NOTE — Telephone Encounter (Signed)
Patient called and left a message for Terri. She has been sick for 3 days, strong pain in the bottom of her stomach. Could this be a flare up with U. Colitis? The return phone number is [phone].

## 2011-12-31 NOTE — Telephone Encounter (Signed)
She feels better today. She will call back this afternoon and let me know how she is doing.

## 2012-01-01 ENCOUNTER — Telehealth: Payer: Self-pay | Admitting: Neurology

## 2012-01-01 ENCOUNTER — Telehealth (INDEPENDENT_AMBULATORY_CARE_PROVIDER_SITE_OTHER): Payer: Self-pay | Admitting: Internal Medicine

## 2012-01-01 DIAGNOSIS — R110 Nausea: Secondary | ICD-10-CM

## 2012-01-01 MED ORDER — ONDANSETRON HCL 4 MG PO TABS: 4.0000 mg | ORAL_TABLET | Freq: Every day | ORAL | Status: DC | PRN

## 2012-01-01 NOTE — Telephone Encounter (Signed)
Called and spoke with the patient. Instructed to stop the Nortriptyline and call us with an update on her symptoms. The patient agreed with this plan.

## 2012-01-01 NOTE — Telephone Encounter (Signed)
stop the medication.

## 2012-01-01 NOTE — Telephone Encounter (Signed)
Received a call from the patient, [name]. She called to report that for about the past 4 days she is experiencing blurred vision, nausea and dizziness. She feels it is the Nortriptyline that is causing the problems. She is taking this medication (25 mg) at bedtime and has been since about March 24th. She did call recently with c/o of dry mouth which is a common side effect of the Nortriptyline. She states that has not really improved much. She reports her sx are bad enough that she can't drive and she was unable to put on eye make up this morning. She denies starting any new medications. I told her that I would let Dr. Jacelyn Grip know that she was having problems and we would get back with her. The patient agrees with this plan. **Dr. Jacelyn Grip, please advise. Thanks.

## 2012-01-01 NOTE — Telephone Encounter (Signed)
Rx for Zofran called in to pharmacy

## 2012-01-03 DIAGNOSIS — R7989 Other specified abnormal findings of blood chemistry: Secondary | ICD-10-CM | POA: Diagnosis not present

## 2012-01-07 ENCOUNTER — Telehealth (INDEPENDENT_AMBULATORY_CARE_PROVIDER_SITE_OTHER): Payer: Self-pay | Admitting: Internal Medicine

## 2012-01-07 NOTE — Telephone Encounter (Signed)
She says she is having 6 stools a day. Possible a fever.  I advised her to push fluids. (She says she has lost 20 pounds with this bout).  Imodium twice a day. PR in 2 days.   I advised her if she became dizzy, to go to the ED.

## 2012-01-08 ENCOUNTER — Ambulatory Visit (INDEPENDENT_AMBULATORY_CARE_PROVIDER_SITE_OTHER): Payer: Medicare Other | Admitting: Internal Medicine

## 2012-01-08 ENCOUNTER — Telehealth (INDEPENDENT_AMBULATORY_CARE_PROVIDER_SITE_OTHER): Payer: Self-pay | Admitting: *Deleted

## 2012-01-08 NOTE — Telephone Encounter (Signed)
The patient called and states that she is having a UC Flare. States that she is having fever and chills , lost 20 pounds in 3 weeks. She saw blood the first 2 weeks , but has not seen any this week.Using Cort Enemas at night. We have rec'd lab work from [facility] and this has been reviewed by Dr.Rehman. Per Dr. Laural [name] bring the patient into the office 01/09/12. Butch Penny has made the appointment and notified the patient.

## 2012-01-09 ENCOUNTER — Other Ambulatory Visit (INDEPENDENT_AMBULATORY_CARE_PROVIDER_SITE_OTHER): Payer: Self-pay | Admitting: Internal Medicine

## 2012-01-09 ENCOUNTER — Encounter (INDEPENDENT_AMBULATORY_CARE_PROVIDER_SITE_OTHER): Payer: Self-pay | Admitting: Internal Medicine

## 2012-01-09 ENCOUNTER — Ambulatory Visit (INDEPENDENT_AMBULATORY_CARE_PROVIDER_SITE_OTHER): Payer: Medicare Other | Admitting: Internal Medicine

## 2012-01-09 VITALS — BP 130/80 | HR 84 | Temp 97.8°F | Resp 18 | Ht 62.0 in | Wt 170.0 lb

## 2012-01-09 DIAGNOSIS — R197 Diarrhea, unspecified: Secondary | ICD-10-CM

## 2012-01-09 DIAGNOSIS — K5190 Ulcerative colitis, unspecified, without complications: Secondary | ICD-10-CM | POA: Diagnosis not present

## 2012-01-09 DIAGNOSIS — R634 Abnormal weight loss: Secondary | ICD-10-CM

## 2012-01-09 DIAGNOSIS — R110 Nausea: Secondary | ICD-10-CM

## 2012-01-09 NOTE — Patient Instructions (Signed)
Please talk with Dr. Edrick Oh about coming off metformin . Physician Will contact her with results of bloodwork . Please check your temperature if you feel you have fever
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
## 2012-01-10 ENCOUNTER — Telehealth (INDEPENDENT_AMBULATORY_CARE_PROVIDER_SITE_OTHER): Payer: Self-pay | Admitting: *Deleted

## 2012-01-10 ENCOUNTER — Encounter: Payer: Self-pay | Admitting: Internal Medicine

## 2012-01-10 DIAGNOSIS — K5190 Ulcerative colitis, unspecified, without complications: Secondary | ICD-10-CM

## 2012-01-10 DIAGNOSIS — E876 Hypokalemia: Secondary | ICD-10-CM

## 2012-01-10 LAB — CBC WITH DIFFERENTIAL/PLATELET
Basophils Relative: 0 % (ref 0–1)
Eosinophils Relative: 5 % (ref 0–5)
HCT: 38.9 % (ref 36.0–46.0)
Hemoglobin: 13.7 g/dL (ref 12.0–15.0)
Lymphocytes Relative: 21 % (ref 12–46)
MCHC: 35.2 g/dL (ref 30.0–36.0)
MCV: 89 fL (ref 78.0–100.0)
Monocytes Absolute: 1.1 10*3/uL — ABNORMAL HIGH (ref 0.1–1.0)
Monocytes Relative: 8 % (ref 3–12)
Neutro Abs: 9.4 10*3/uL — ABNORMAL HIGH (ref 1.7–7.7)

## 2012-01-10 LAB — BASIC METABOLIC PANEL
BUN: 10 mg/dL (ref 6–23)
Chloride: 83 mEq/L — ABNORMAL LOW (ref 96–112)
Creat: 0.71 mg/dL (ref 0.50–1.10)
Glucose, Bld: 102 mg/dL — ABNORMAL HIGH (ref 70–99)
Potassium: 2.6 mEq/L — CL (ref 3.5–5.3)

## 2012-01-10 NOTE — Progress Notes (Signed)
Already addressed with patient.

## 2012-01-10 NOTE — Progress Notes (Signed)
I spoke with [name] at Dr. Olevia Perches office. She is going to page NUR and let him know what pt's results are.

## 2012-01-10 NOTE — Progress Notes (Signed)
Patient ID: [name], female   DOB: 01-13-1946, 66 y.o.   MRN: [id number] Laboratory called me about potassium by 2.6 on blood work drawn yesterday.  This blood work was drawn yesterday afternoon I was not called until a little after 5:00 this morning. Will send this information to Dr. Laural [name] for his disposition.

## 2012-01-10 NOTE — Telephone Encounter (Signed)
Almyra Free called our office to make [place] aware that Dr. Gala Romney had rec'd a call at  5 a.m from [facility], patient's Potassium is 2.6 , CRP elevated and WBC. I told Almyra Free that I would contact Dr. Laural [name] and that he would address it. Dr. Laural [name] paged. Dr.Rehman is aware and addressing with the patient.

## 2012-01-10 NOTE — Progress Notes (Signed)
Called Dr. Olevia Perches office, LM on [name]'s voicemail to return call.

## 2012-01-10 NOTE — Telephone Encounter (Signed)
Per Dr.Rehman the patient will need to go and get CBC/D , B-Met on 01/14/12. Patient will be called and made aware. I did call the lab and have them add the Magnesium. [facility] also [facility] and gave the following prescription per NUR : KCL 26mq-Patient to take 2 by mouth today, then take 1 by mouth everyday #60 with 1 refill.

## 2012-01-11 ENCOUNTER — Inpatient Hospital Stay (HOSPITAL_COMMUNITY)
Admission: EM | Admit: 2012-01-11 | Discharge: 2012-01-19 | DRG: 372 | Disposition: A | Discharge status: Home / Self Care | Payer: Medicare Other | Attending: Internal Medicine | Admitting: Internal Medicine

## 2012-01-11 ENCOUNTER — Encounter (HOSPITAL_COMMUNITY): Payer: Self-pay | Admitting: *Deleted

## 2012-01-11 ENCOUNTER — Emergency Department (HOSPITAL_COMMUNITY): Payer: Medicare Other

## 2012-01-11 DIAGNOSIS — E860 Dehydration: Secondary | ICD-10-CM | POA: Diagnosis not present

## 2012-01-11 DIAGNOSIS — E785 Hyperlipidemia, unspecified: Secondary | ICD-10-CM

## 2012-01-11 DIAGNOSIS — K5190 Ulcerative colitis, unspecified, without complications: Secondary | ICD-10-CM

## 2012-01-11 DIAGNOSIS — R109 Unspecified abdominal pain: Secondary | ICD-10-CM | POA: Diagnosis not present

## 2012-01-11 DIAGNOSIS — R002 Palpitations: Secondary | ICD-10-CM

## 2012-01-11 DIAGNOSIS — A0472 Enterocolitis due to Clostridium difficile, not specified as recurrent: Principal | ICD-10-CM

## 2012-01-11 DIAGNOSIS — I10 Essential (primary) hypertension: Secondary | ICD-10-CM

## 2012-01-11 DIAGNOSIS — Z79899 Other long term (current) drug therapy: Secondary | ICD-10-CM

## 2012-01-11 DIAGNOSIS — E119 Type 2 diabetes mellitus without complications: Secondary | ICD-10-CM | POA: Diagnosis present

## 2012-01-11 DIAGNOSIS — R1084 Generalized abdominal pain: Secondary | ICD-10-CM | POA: Diagnosis not present

## 2012-01-11 DIAGNOSIS — E1165 Type 2 diabetes mellitus with hyperglycemia: Secondary | ICD-10-CM

## 2012-01-11 DIAGNOSIS — R188 Other ascites: Secondary | ICD-10-CM | POA: Diagnosis not present

## 2012-01-11 DIAGNOSIS — G629 Polyneuropathy, unspecified: Secondary | ICD-10-CM

## 2012-01-11 DIAGNOSIS — K5120 Ulcerative (chronic) proctitis without complications: Secondary | ICD-10-CM | POA: Diagnosis not present

## 2012-01-11 DIAGNOSIS — Z9889 Other specified postprocedural states: Secondary | ICD-10-CM

## 2012-01-11 DIAGNOSIS — R110 Nausea: Secondary | ICD-10-CM

## 2012-01-11 DIAGNOSIS — R197 Diarrhea, unspecified: Secondary | ICD-10-CM | POA: Diagnosis not present

## 2012-01-11 DIAGNOSIS — E8809 Other disorders of plasma-protein metabolism, not elsewhere classified: Secondary | ICD-10-CM | POA: Diagnosis present

## 2012-01-11 DIAGNOSIS — R5381 Other malaise: Secondary | ICD-10-CM

## 2012-01-11 DIAGNOSIS — R7989 Other specified abnormal findings of blood chemistry: Secondary | ICD-10-CM

## 2012-01-11 DIAGNOSIS — G4700 Insomnia, unspecified: Secondary | ICD-10-CM

## 2012-01-11 DIAGNOSIS — F419 Anxiety disorder, unspecified: Secondary | ICD-10-CM

## 2012-01-11 DIAGNOSIS — R079 Chest pain, unspecified: Secondary | ICD-10-CM

## 2012-01-11 DIAGNOSIS — D696 Thrombocytopenia, unspecified: Secondary | ICD-10-CM

## 2012-01-11 DIAGNOSIS — N959 Unspecified menopausal and perimenopausal disorder: Secondary | ICD-10-CM

## 2012-01-11 DIAGNOSIS — D72829 Elevated white blood cell count, unspecified: Secondary | ICD-10-CM | POA: Diagnosis present

## 2012-01-11 DIAGNOSIS — E871 Hypo-osmolality and hyponatremia: Secondary | ICD-10-CM

## 2012-01-11 DIAGNOSIS — E876 Hypokalemia: Secondary | ICD-10-CM

## 2012-01-11 DIAGNOSIS — D6959 Other secondary thrombocytopenia: Secondary | ICD-10-CM | POA: Diagnosis not present

## 2012-01-11 DIAGNOSIS — R5383 Other fatigue: Secondary | ICD-10-CM

## 2012-01-11 DIAGNOSIS — M25519 Pain in unspecified shoulder: Secondary | ICD-10-CM

## 2012-01-11 DIAGNOSIS — R200 Anesthesia of skin: Secondary | ICD-10-CM

## 2012-01-11 DIAGNOSIS — Z9189 Other specified personal risk factors, not elsewhere classified: Secondary | ICD-10-CM

## 2012-01-11 DIAGNOSIS — IMO0002 Reserved for concepts with insufficient information to code with codable children: Secondary | ICD-10-CM

## 2012-01-11 DIAGNOSIS — E878 Other disorders of electrolyte and fluid balance, not elsewhere classified: Secondary | ICD-10-CM

## 2012-01-11 DIAGNOSIS — K219 Gastro-esophageal reflux disease without esophagitis: Secondary | ICD-10-CM

## 2012-01-11 DIAGNOSIS — Z7982 Long term (current) use of aspirin: Secondary | ICD-10-CM

## 2012-01-11 LAB — URINALYSIS, ROUTINE W REFLEX MICROSCOPIC
Glucose, UA: NEGATIVE mg/dL
Hgb urine dipstick: NEGATIVE
Ketones, ur: 15 mg/dL — AB
Protein, ur: NEGATIVE mg/dL
pH: 5.5 (ref 5.0–8.0)

## 2012-01-11 LAB — CBC
HCT: 37.4 % (ref 36.0–46.0)
MCHC: 35.6 g/dL (ref 30.0–36.0)
RDW: 12 % (ref 11.5–15.5)

## 2012-01-11 LAB — BASIC METABOLIC PANEL
CO2: 33 mEq/L — ABNORMAL HIGH (ref 19–32)
Calcium: 8.3 mg/dL — ABNORMAL LOW (ref 8.4–10.5)
Glucose, Bld: 251 mg/dL — ABNORMAL HIGH (ref 70–99)
Potassium: 2.7 mEq/L — CL (ref 3.5–5.1)
Sodium: 122 mEq/L — ABNORMAL LOW (ref 135–145)

## 2012-01-11 LAB — DIFFERENTIAL
Band Neutrophils: 0 % (ref 0–10)
Blasts: 0 %
Lymphocytes Relative: 5 % — ABNORMAL LOW (ref 12–46)
Lymphs Abs: 0.8 10*3/uL (ref 0.7–4.0)
Promyelocytes Absolute: 0 %

## 2012-01-11 LAB — GLUCOSE, CAPILLARY: Glucose-Capillary: 256 mg/dL — ABNORMAL HIGH (ref 70–99)

## 2012-01-11 MED ORDER — POTASSIUM CHLORIDE 10 MEQ/100ML IV SOLN
10.0000 meq | INTRAVENOUS | Status: AC
  Administered 2012-01-11 – 2012-01-12 (×4): 10 meq via INTRAVENOUS
  Filled 2012-01-11 (×3): qty 100

## 2012-01-11 MED ORDER — SODIUM CHLORIDE 0.9 % IV BOLUS (SEPSIS)
1000.0000 mL | Freq: Once | INTRAVENOUS | Status: AC
  Administered 2012-01-11: 1000 mL via INTRAVENOUS

## 2012-01-11 MED ORDER — ONDANSETRON HCL 4 MG/2ML IJ SOLN
4.0000 mg | Freq: Once | INTRAMUSCULAR | Status: AC
  Administered 2012-01-11: 4 mg via INTRAVENOUS
  Filled 2012-01-11: qty 2

## 2012-01-11 MED ORDER — ONDANSETRON HCL 4 MG/2ML IJ SOLN
4.0000 mg | Freq: Four times a day (QID) | INTRAMUSCULAR | Status: DC | PRN
  Administered 2012-01-11 – 2012-01-13 (×3): 4 mg via INTRAVENOUS
  Filled 2012-01-11 (×3): qty 2

## 2012-01-11 MED ORDER — SODIUM CHLORIDE 0.9 % IV BOLUS (SEPSIS): 500.0000 mL | INTRAVENOUS | Status: DC

## 2012-01-11 MED ORDER — SODIUM CHLORIDE 0.9 % IV SOLN
Freq: Once | INTRAVENOUS | Status: AC
  Administered 2012-01-11: 21:00:00 via INTRAVENOUS

## 2012-01-11 MED ORDER — ACETAMINOPHEN 500 MG PO TABS
1000.0000 mg | ORAL_TABLET | Freq: Once | ORAL | Status: AC
  Administered 2012-01-11: 1000 mg via ORAL
  Filled 2012-01-11: qty 2

## 2012-01-11 MED ORDER — SODIUM CHLORIDE 0.9 % IV SOLN: INTRAVENOUS | Status: DC

## 2012-01-11 MED ORDER — IOHEXOL 300 MG/ML  SOLN
100.0000 mL | Freq: Once | INTRAMUSCULAR | Status: AC | PRN
  Administered 2012-01-11: 100 mL via INTRAVENOUS

## 2012-01-11 MED ORDER — POTASSIUM CHLORIDE CRYS ER 20 MEQ PO TBCR
40.0000 meq | EXTENDED_RELEASE_TABLET | Freq: Once | ORAL | Status: AC
  Administered 2012-01-11: 40 meq via ORAL
  Filled 2012-01-11: qty 2

## 2012-01-11 MED ORDER — ONDANSETRON HCL 4 MG PO TABS
4.0000 mg | ORAL_TABLET | Freq: Four times a day (QID) | ORAL | Status: DC | PRN
  Administered 2012-01-12 – 2012-01-13 (×2): 4 mg via ORAL
  Filled 2012-01-11 (×2): qty 1

## 2012-01-11 NOTE — ED Notes (Signed)
CRITICAL VALUE ALERT  Critical value received:  Potassium - 2.7  Date of notification:  01/11/2012  Time of notification:  2053  Critical value read back:yes  Nurse who received alert:  LJS  MD notified (1st page):  Tomi Bamberger  Time of first page:  2053  MD notified (2nd page):  Time of second page:  Responding MD:  Tomi Bamberger  Time MD responded:  2053

## 2012-01-11 NOTE — ED Provider Notes (Signed)
History     CSN: [id number]  Arrival date & time 01/11/12  65   First MD Initiated Contact with Patient 01/11/12 1950      Chief Complaint  Patient presents with  . Altered Mental Status    (Consider location/radiation/quality/duration/timing/severity/associated sxs/prior treatment) HPI  Patient relates she's had ulcerative colitis for a long time. She relates she is however had a flareup for the past 3 weeks and states this is the worst flare she's ever had. She states she's having diarrhea daily about 6-8 times with nausea but no vomiting. She relates she's been having chills off and on for the past 3 weeks. She has lost about 20 pounds in that 3 weeks. She states her abdomen feels bloated and she has diffuse abdominal pain specifically say it hurts in the upper and the lower abdomen. She states the pain is achy. She states the pain is similar to her ulcerative colitis flare up in the past. She states she was seen 2 days ago by Dr. Laural [name] who called her yesterday to let her her potassium was low and started her on potassium pills. She states she took some Dramamine today, she was feeling cold. She states she feels weak and dizzy. Family relates today she just seemed very weak and had difficulty putting her thoughts together.  PCP Dr. [facility] Dr Laural [name]   Past Medical History  Diagnosis Date  . MENOPAUSAL DISORDER   . REDUCTION MAMMOPLASTY, HX OF 1994    bilateral  . ULCERATIVE COLITIS   . HYPERTENSION   . GERD   . DIABETES MELLITUS, TYPE II   . Ulcerative colitis     Past Surgical History  Procedure Date  . Abdominal hysterectomy 1980    total, with bladder tack  . Breast surgery 1994    Bilateral, due to fibrocystic breast changes  . Rectocele repair 1990  . Carpal tunnel release 1996    Right  . Right knee arthroscopy 01/1999    then again 10/2000  . L4-l5 diskectomy 08/1999  . Cholecystectomy     Family History  Problem Relation Age of  Onset  . Diabetes Mother   . Heart attack Father     Mid 17's  . Alcohol abuse Other     History  Substance Use Topics  . Smoking status: Never Smoker   . 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 Alcohol Use: Yes     Rare  Retired  OB History    Grav Para Term Preterm Abortions TAB SAB Ect Mult Living                  Review of Systems  All other systems reviewed and are negative.    Allergies  Chloraseptic; Colesevelam; Nisoldipine; Oxycodone-acetaminophen; and Prilosec  Home Medications   Current Outpatient Rx  Name Route Sig Dispense Refill  . ALPRAZOLAM 0.25 MG PO TABS Oral Take 0.25 mg by mouth. prn     . ASPIRIN EC 325 MG PO TBEC Oral Take 325 mg by mouth at bedtime.    . CHROMIUM PICOLINATE 500 MCG PO TABS Oral Take by mouth daily.     . DESVENLAFAXINE SUCCINATE ER 50 MG PO TB24 Oral Take 50 mg by mouth daily.      Marland Kitchen ESOMEPRAZOLE MAGNESIUM 40 MG PO CPDR Oral Take 40 mg by  mouth. One twice daily    . ESTRADIOL 1 MG PO TABS Oral Take 1 mg by mouth daily.      Marland Kitchen GLIPIZIDE ER 2.5 MG PO TB24 Oral Take 10 mg by mouth daily.     Marland Kitchen GLUCOSE BLOOD VI STRP  And lancets 1 daily.  250.00 100 each 3  . HYDROCHLOROTHIAZIDE 50 MG PO TABS Oral Take 1 tablet (50 mg total) by mouth daily. 90 tablet 3  . HYDROCORTISONE 100 MG/60ML RE ENEM Rectal Place 1 enema (100 mg total) rectally at bedtime. 60 mL 1    Number 30 with one refill  . HYDROCORTISONE 100 MG/60ML RE ENEM Rectal Place 100 mg rectally at bedtime.     Marland Kitchen LINAGLIPTIN 5 MG PO TABS Oral Take 5 mg by mouth daily.      Marland Kitchen METFORMIN HCL 500 MG PO TABS Oral Take 500 mg by mouth at bedtime.    Marland Kitchen METOPROLOL SUCCINATE ER 100 MG PO TB24  1 tablet by mouth every morning, 1/2 tablet every evening     . ONDANSETRON HCL 4 MG PO TABS Oral Take 1 tablet (4 mg total) by mouth daily as needed  for nausea. 30 tablet 1    BP 104/79  Pulse 120  Temp(Src) 99.2 F (37.3 C) (Oral)  Resp 20  Ht 5' 2"  (1.575 m)  Wt 170 lb (77.111 kg)  BMI 31.09 kg/m2  SpO2 99%  Vital signs normal except tachycardia   Physical Exam  Constitutional: She is oriented to person, place, and time. She appears well-developed and well-nourished.  Non-toxic appearance. She does not appear ill. No distress.  HENT:  Head: Normocephalic and atraumatic.  Right Ear: External ear normal.  Left Ear: External ear normal.  Nose: Nose normal. No mucosal edema or rhinorrhea.  Mouth/Throat: Mucous membranes are normal. No dental abscesses or uvula swelling.       Mucous membranes dry  Eyes: Conjunctivae and EOM are normal. Pupils are equal, round, and reactive to light.       Conjunctiva pale  Neck: Normal range of motion and full passive range of motion without pain. Neck supple.  Cardiovascular: Normal rate, regular rhythm and normal heart sounds.  Exam reveals no gallop and no friction rub.   No murmur heard. Pulmonary/Chest: Effort normal and breath sounds normal. No respiratory distress. She has no wheezes. She has no rhonchi. She has no rales. She exhibits no tenderness and no crepitus.  Abdominal: Soft. Normal appearance and bowel sounds are normal. She exhibits no distension. There is tenderness. There is no rebound and no guarding.       Patient has diffuse tenderness  Musculoskeletal: Normal range of motion. She exhibits no edema and no tenderness.       Moves all extremities well.   Neurological: She is alert and oriented to person, place, and time. She has normal strength. No cranial nerve deficit.  Skin: Skin is warm, dry and intact. No rash noted. No erythema. There is pallor.  Psychiatric: Her speech is normal and behavior is normal. Her mood appears not anxious.       Affect is flat    ED Course  Procedures (including critical care time)   Medications  potassium chloride 10 mEq in 100 mL  IVPB (10 mEq Intravenous New Bag/Given 01/11/12 2148)  sodium chloride 0.9 % bolus 1,000 mL (1000 mL Intravenous Given 01/11/12 2027)  ondansetron (ZOFRAN) injection 4 mg (4 mg Intravenous Given 01/11/12 2031)  acetaminophen (TYLENOL) tablet  1,000 mg (1000 mg Oral Given 01/11/12 2102)  0.9 %  sodium chloride infusion (  Intravenous New Bag/Given 01/11/12 2102)  potassium chloride SA (K-DUR,KLOR-CON) CR tablet 40 mEq (40 mEq Oral Given 01/11/12 2137)   Review of Dr. Duncan Dull note on the 10th shows she gave a history of being started on metformin a week before she started having this flareup. He advised her to discuss stopping the metformin with her primary care physician. He also advised her to stop taking the HCTZ with her diarrhea so she would not get dehydrated. Her laboratory results done on the 11th show sodium 132, chloride 83, potassium 2.6.  21:50 Dr Maryland Pink will admit, CT pending  Results for orders placed during the hospital encounter of 01/11/12  GLUCOSE, CAPILLARY      Component Value Range   Glucose-Capillary 256 (*) 70 - 99 (mg/dL)  CBC      Component Value Range   WBC 15.7 (*) 4.0 - 10.5 (K/uL)   RBC 4.23  3.87 - 5.11 (MIL/uL)   Hemoglobin 13.3  12.0 - 15.0 (g/dL)   HCT 37.4  36.0 - 46.0 (%)   MCV 88.4  78.0 - 100.0 (fL)   MCH 31.4  26.0 - 34.0 (pg)   MCHC 35.6  30.0 - 36.0 (g/dL)   RDW 12.0  11.5 - 15.5 (%)   Platelets 259  150 - 400 (K/uL)  DIFFERENTIAL      Component Value Range   Neutrophils Relative 92 (*) 43 - 77 (%)   Lymphocytes Relative 5 (*) 12 - 46 (%)   Monocytes Relative 2 (*) 3 - 12 (%)   Eosinophils Relative 1  0 - 5 (%)   Basophils Relative 0  0 - 1 (%)   Band Neutrophils 0  0 - 10 (%)   Metamyelocytes Relative 0     Myelocytes 0     Promyelocytes Absolute 0     Blasts 0     nRBC 0  0 (/100 WBC)   Neutro Abs 14.4 (*) 1.7 - 7.7 (K/uL)   Lymphs Abs 0.8  0.7 - 4.0 (K/uL)   Monocytes Absolute 0.3  0.1 - 1.0 (K/uL)   Eosinophils Absolute 0.2  0.0 - 0.7  (K/uL)   Basophils Absolute 0.0  0.0 - 0.1 (K/uL)  BASIC METABOLIC PANEL      Component Value Range   Sodium 122 (*) 135 - 145 (mEq/L)   Potassium 2.7 (*) 3.5 - 5.1 (mEq/L)   Chloride 79 (*) 96 - 112 (mEq/L)   CO2 33 (*) 19 - 32 (mEq/L)   Glucose, Bld 251 (*) 70 - 99 (mg/dL)   BUN 8  6 - 23 (mg/dL)   Creatinine, Ser 0.82  0.50 - 1.10 (mg/dL)   Calcium 8.3 (*) 8.4 - 10.5 (mg/dL)   GFR calc non Af Amer 74 (*) >90 (mL/min)   GFR calc Af Amer 86 (*) >90 (mL/min)   Laboratory interpretation all normal except for leukocytosis, hypo-natremia, hypokalemia, low chloride, hyperglycemia  CT AP Pending     Date: 01/11/2012  Rate: 110  Rhythm: sinus tachycardia  QRS Axis: left  Intervals: normal  ST/T Wave abnormalities: normal  Conduction Disutrbances:none  Narrative Interpretation: PRWP  Old EKG Reviewed: changes noted from 09/12/2006 HR was 80        1. Diarrhea   2. Nausea   3. Dehydration   4. Hyponatremia   5. Hypokalemia   6. Chloride, decreased level   7. Abdominal pain  8. Ulcerative colitis    Plan admission  Rolland Porter, MD, FACEP    MDM          Janice Norrie, MD 01/12/12 1259

## 2012-01-11 NOTE — ED Notes (Signed)
Pt c/o being confused and unable to think properly. Pt found in towel by her family this afternoon and could answer questions appropriately. Pt alert and oriented x 3 at this time. Pt has had diarrhea x 1 month and being treated by Dr. Elesa Massed for ulcerative colitis and low potassium.

## 2012-01-11 NOTE — H&P (Addendum)
[name] is an 66 y.o. female.    PCP: Sherrie Mustache, MD, MD  Her gastroenterologist is Dr. Laural [name]  Chief Complaint: "Feeling bad"  HPI: This is a 66 year old, Caucasian female, with a past medical history of type retention, diabetes, ulcerative colitis, who has been in her usual state of health about 3 weeks ago, when she started having diarrhea. She had noted bloated feeling in her abdomen, but denies any pain, per se. She's had about 6-8 episodes of loose stools every day since 3 weeks. It has been watery, brownish in color. Initially, it was bloody, but then, subsequently, there's been no blood. She felt dizzy and lightheaded, but denies any syncopal episodes. Had nausea without any emesis. She said her blood sugars has been fluctuating a lot over the last 3 weeks. Has been feeling hot and had chills, but the cannot tell me if she had a fever. She's lost about 20 pounds in the last 3 weeks. She went to see Dr. Melony Overly a few days ago, and she was taken off her for diabetes medications. She was prescribed cortisone enemas about 3 weeks ago, but that hasn't really helped her symptoms unfortunately. Denies any antibiotic use recently. She's had ulcerative colitis for 20 years and doesn't get too many relapses. She has not tolerated any of the usual treatments for her ulcerative colitis.   Home Medications: Prior to Admission medications   Medication Sig Start Date End Date Taking? Authorizing Provider  ALPRAZolam (XANAX) 0.25 MG tablet Take 0.25 mg by mouth. prn     Historical Provider, MD  aspirin EC 325 MG tablet Take 325 mg by mouth at bedtime.    Historical Provider, MD  Chromium Picolinate 500 MCG TABS Take by mouth daily.     Historical Provider, MD  desvenlafaxine (PRISTIQ) 50 MG 24 hr tablet Take 50 mg by mouth daily.      Historical Provider, MD  esomeprazole (NEXIUM) 40 MG capsule Take 40 mg by mouth. One twice daily    Historical Provider, MD  estradiol (ESTRACE) 1 MG  tablet Take 1 mg by mouth daily.      Historical Provider, MD  glipiZIDE (GLUCOTROL XL) 2.5 MG 24 hr tablet Take 10 mg by mouth daily.  05/30/11 05/29/12  Renato Shin, MD  glucose blood test strip And lancets 1 daily.  250.00 05/30/11   Renato Shin, MD  hydrochlorothiazide 50 MG tablet Take 1 tablet (50 mg total) by mouth daily. 01/24/11   Rowe Clack, MD  hydrocortisone (CORTENEMA) 100 MG/60ML enema Place 1 enema (100 mg total) rectally at bedtime. 10/04/11 10/14/11  Butch Penny, NP  hydrocortisone (CORTENEMA) 100 MG/60ML enema Place 100 mg rectally at bedtime.  01/04/12   Historical Provider, MD  linagliptin (TRADJENTA) 5 MG TABS tablet Take 5 mg by mouth daily.      Historical Provider, MD  metFORMIN (GLUCOPHAGE) 500 MG tablet Take 500 mg by mouth at bedtime.    Historical Provider, MD  metoprolol (TOPROL-XL) 100 MG 24 hr tablet 1 tablet by mouth every morning, 1/2 tablet every evening  01/24/11   Rowe Clack, MD  ondansetron (ZOFRAN) 4 MG tablet Take 1 tablet (4 mg total) by mouth daily as needed for nausea. 01/01/12 12/31/12  Butch Penny, NP    Allergies:  Allergies  Allergen Reactions  . Chloraseptic   . Colesevelam     REACTION: gi sxs  . Nisoldipine   . Oxycodone-Acetaminophen     REACTION: Itch  .  Prilosec (Omeprazole)     Past Medical History: Past Medical History  Diagnosis Date  . MENOPAUSAL DISORDER   . REDUCTION MAMMOPLASTY, HX OF 1994    bilateral  . ULCERATIVE COLITIS   . HYPERTENSION   . GERD   . DIABETES MELLITUS, TYPE II   . Ulcerative colitis     Past Surgical History  Procedure Date  . Abdominal hysterectomy 1980    total, with bladder tack  . Breast surgery 1994    Bilateral, due to fibrocystic breast changes  . Rectocele repair 1990  . Carpal tunnel release 1996    Right  . Right knee arthroscopy 01/1999    then again 10/2000  . L4-l5 diskectomy 08/1999  . Cholecystectomy     Social History:  reports that she has never smoked. She has  never used smokeless tobacco. She reports that she drinks alcohol. She reports that she does not use illicit drugs.  Family History:  Family History  Problem Relation Age of Onset  . Diabetes Mother   . Heart attack Father     Mid 66's  . Heart disease Father   . Alcohol abuse Other     Review of Systems - History obtained from the patient General ROS: positive for  - fatigue Psychological ROS: negative Ophthalmic ROS: negative ENT ROS: negative Allergy and Immunology ROS: negative Hematological and Lymphatic ROS: negative Endocrine ROS: negative Respiratory ROS: no cough, shortness of breath, or wheezing Cardiovascular ROS: no chest pain or dyspnea on exertion Gastrointestinal ROS: as in hpi Genito-Urinary ROS: negative Musculoskeletal ROS: negative Neurological ROS: negative Dermatological ROS: negative  Physical Examination Blood pressure 137/67, pulse 112, temperature 102.4 F (39.1 C), temperature source Oral, resp. rate 24, height 5' 2"  (1.575 m), weight 77.111 kg (170 lb), SpO2 95.00%.  General appearance: alert, cooperative, appears stated age, fatigued and no distress Head: Normocephalic, without obvious abnormality, atraumatic Eyes: conjunctivae/corneas clear. PERRL, EOM's intact.  Throat: dry mm Neck: no adenopathy, no carotid bruit, no JVD, supple, symmetrical, trachea midline and thyroid not enlarged, symmetric, no tenderness/mass/nodules Resp: clear to auscultation bilaterally Cardio:S1, S2 is tachycardic, but regular, no murmur, click, rub or gallop GI: Soft, vague tenderness in the left side of the abdomen without any rebound, rigidity, or guarding. There is no masses, organomegaly.  Bowel sounds are present Extremities: extremities normal, atraumatic, no cyanosis or edema Pulses: 2+ and symmetric Skin: Skin color, texture, turgor normal. No rashes or lesions Lymph nodes: Cervical, supraclavicular, and axillary nodes normal. Neurologic: Grossly  normal  Laboratory Data: Results for orders placed during the hospital encounter of 01/11/12 (from the past 48 hour(s))  GLUCOSE, CAPILLARY     Status: Abnormal   Collection Time   01/11/12  7:27 PM      Component Value Range Comment   Glucose-Capillary 256 (*) 70 - 99 (mg/dL)   CBC     Status: Abnormal   Collection Time   01/11/12  8:00 PM      Component Value Range Comment   WBC 15.7 (*) 4.0 - 10.5 (K/uL)    RBC 4.23  3.87 - 5.11 (MIL/uL)    Hemoglobin 13.3  12.0 - 15.0 (g/dL)    HCT 37.4  36.0 - 46.0 (%)    MCV 88.4  78.0 - 100.0 (fL)    MCH 31.4  26.0 - 34.0 (pg)    MCHC 35.6  30.0 - 36.0 (g/dL)    RDW 12.0  11.5 - 15.5 (%)    Platelets 259  150 - 400 (K/uL)   DIFFERENTIAL     Status: Abnormal   Collection Time   01/11/12  8:00 PM      Component Value Range Comment   Neutrophils Relative 92 (*) 43 - 77 (%)    Lymphocytes Relative 5 (*) 12 - 46 (%)    Monocytes Relative 2 (*) 3 - 12 (%)    Eosinophils Relative 1  0 - 5 (%)    Basophils Relative 0  0 - 1 (%)    Band Neutrophils 0  0 - 10 (%)    Metamyelocytes Relative 0      Myelocytes 0      Promyelocytes Absolute 0      Blasts 0      nRBC 0  0 (/100 WBC)    Neutro Abs 14.4 (*) 1.7 - 7.7 (K/uL)    Lymphs Abs 0.8  0.7 - 4.0 (K/uL)    Monocytes Absolute 0.3  0.1 - 1.0 (K/uL)    Eosinophils Absolute 0.2  0.0 - 0.7 (K/uL)    Basophils Absolute 0.0  0.0 - 0.1 (K/uL)   BASIC METABOLIC PANEL     Status: Abnormal   Collection Time   01/11/12  8:00 PM      Component Value Range Comment   Sodium 122 (*) 135 - 145 (mEq/L)    Potassium 2.7 (*) 3.5 - 5.1 (mEq/L)    Chloride 79 (*) 96 - 112 (mEq/L)    CO2 33 (*) 19 - 32 (mEq/L)    Glucose, Bld 251 (*) 70 - 99 (mg/dL)    BUN 8  6 - 23 (mg/dL)    Creatinine, Ser 0.82  0.50 - 1.10 (mg/dL)    Calcium 8.3 (*) 8.4 - 10.5 (mg/dL)    GFR calc non Af Amer 74 (*) >90 (mL/min)    GFR calc Af Amer 86 (*) >90 (mL/min)     Radiology Reports: No results  found.   Assessment/Plan  Principal Problem:  *ULCERATIVE COLITIS Active Problems:  DIABETES MELLITUS, TYPE II  HYPERTENSION  GERD  Hyponatremia  Hypokalemia  Hypomagnesemia  Dehydration   #1 ulcerative colitis, with acute exacerbation: CT scan is pending at this time. If there is no other reason for her abdominal discomfort and her diarrhea we will initiate steroids. We will have Dr. Laural [name] see her in the morning. She likely has leukocytosis due to recent steroid use although infectious etiology cannot be ruled out at this time.  #2 severe dehydration with hyponatremia, hypokalemia and hypomagnesemia: She should, be given IV fluids. Her potassium and magnesium will be repleted. Blood pressure is stable at this time. She is mildly tachycardic from dehydration.  #3 diabetes, type II: We put her on a sliding scale. HbA1c will be checked. We'll hold her oral hypoglycemic agents for now.  #4 history of hypertension. Continue to monitor blood pressure.  #5 DVT, prophylaxis with SCDs.  She's a full code.  Further management decisions will depend on results of further testing and patient's response to treatment.  [facility] Pager [phone]  01/11/2012, 10:17 PM   Ct Abdomen suggests colitis consistent with UC. Will initiate steroids and await Gi input in AM.  Omeka Holben 2:48 AM  Patient now spiking fevers. Unclear if this is from her UC or if she has an infectious process as well. Will send stool for c -diff. Check lactic acid. Till this is sorted out, will initiate antibiotics. Discussed with Dr. Gala Romney. He will see her today.  Continue steroids for now.  Elmor Kost 5:52 AM

## 2012-01-11 NOTE — ED Notes (Addendum)
Per pt and family, pt has "been sick for about 3 weeks."  Reports nausea and diarrhea, but no vomiting.  Reports "everything I'm eating just makes me gag." Pt reports decreased appetite.  Reports she is taking her medications without difficulty.  Reports checking blood sugar twice daily, states was 230 this morning.

## 2012-01-11 NOTE — ED Notes (Signed)
Assisted patient to ambulate to bathroom. Patient gait steady. No complaints of dizziness at this time.

## 2012-01-12 ENCOUNTER — Encounter (HOSPITAL_COMMUNITY): Payer: Self-pay | Admitting: *Deleted

## 2012-01-12 DIAGNOSIS — G4700 Insomnia, unspecified: Secondary | ICD-10-CM | POA: Diagnosis present

## 2012-01-12 DIAGNOSIS — I10 Essential (primary) hypertension: Secondary | ICD-10-CM | POA: Diagnosis present

## 2012-01-12 DIAGNOSIS — Z7982 Long term (current) use of aspirin: Secondary | ICD-10-CM | POA: Diagnosis not present

## 2012-01-12 DIAGNOSIS — K5289 Other specified noninfective gastroenteritis and colitis: Secondary | ICD-10-CM | POA: Diagnosis not present

## 2012-01-12 DIAGNOSIS — D72829 Elevated white blood cell count, unspecified: Secondary | ICD-10-CM | POA: Diagnosis present

## 2012-01-12 DIAGNOSIS — E876 Hypokalemia: Secondary | ICD-10-CM | POA: Diagnosis not present

## 2012-01-12 DIAGNOSIS — K219 Gastro-esophageal reflux disease without esophagitis: Secondary | ICD-10-CM | POA: Diagnosis present

## 2012-01-12 DIAGNOSIS — R109 Unspecified abdominal pain: Secondary | ICD-10-CM | POA: Diagnosis not present

## 2012-01-12 DIAGNOSIS — R188 Other ascites: Secondary | ICD-10-CM | POA: Diagnosis not present

## 2012-01-12 DIAGNOSIS — K633 Ulcer of intestine: Secondary | ICD-10-CM | POA: Diagnosis not present

## 2012-01-12 DIAGNOSIS — R197 Diarrhea, unspecified: Secondary | ICD-10-CM | POA: Diagnosis not present

## 2012-01-12 DIAGNOSIS — K5180 Other ulcerative colitis without complications: Secondary | ICD-10-CM | POA: Diagnosis not present

## 2012-01-12 DIAGNOSIS — K5190 Ulcerative colitis, unspecified, without complications: Secondary | ICD-10-CM | POA: Diagnosis present

## 2012-01-12 DIAGNOSIS — A0472 Enterocolitis due to Clostridium difficile, not specified as recurrent: Secondary | ICD-10-CM | POA: Diagnosis not present

## 2012-01-12 DIAGNOSIS — D696 Thrombocytopenia, unspecified: Secondary | ICD-10-CM | POA: Diagnosis not present

## 2012-01-12 DIAGNOSIS — E871 Hypo-osmolality and hyponatremia: Secondary | ICD-10-CM | POA: Diagnosis not present

## 2012-01-12 DIAGNOSIS — R110 Nausea: Secondary | ICD-10-CM | POA: Diagnosis not present

## 2012-01-12 DIAGNOSIS — R141 Gas pain: Secondary | ICD-10-CM | POA: Diagnosis not present

## 2012-01-12 DIAGNOSIS — E8809 Other disorders of plasma-protein metabolism, not elsewhere classified: Secondary | ICD-10-CM | POA: Diagnosis present

## 2012-01-12 DIAGNOSIS — Z79899 Other long term (current) drug therapy: Secondary | ICD-10-CM | POA: Diagnosis not present

## 2012-01-12 DIAGNOSIS — E119 Type 2 diabetes mellitus without complications: Secondary | ICD-10-CM | POA: Diagnosis present

## 2012-01-12 DIAGNOSIS — R1084 Generalized abdominal pain: Secondary | ICD-10-CM | POA: Diagnosis not present

## 2012-01-12 DIAGNOSIS — E860 Dehydration: Secondary | ICD-10-CM | POA: Diagnosis not present

## 2012-01-12 DIAGNOSIS — D6959 Other secondary thrombocytopenia: Secondary | ICD-10-CM | POA: Diagnosis not present

## 2012-01-12 LAB — COMPREHENSIVE METABOLIC PANEL
ALT: 26 U/L (ref 0–35)
CO2: 25 mEq/L (ref 19–32)
Calcium: 7.1 mg/dL — ABNORMAL LOW (ref 8.4–10.5)
Chloride: 90 mEq/L — ABNORMAL LOW (ref 96–112)
Creatinine, Ser: 0.66 mg/dL (ref 0.50–1.10)
GFR calc Af Amer: >90 mL/min (ref >90)
GFR calc non Af Amer: >90 mL/min (ref >90)
Glucose, Bld: 241 mg/dL — ABNORMAL HIGH (ref 70–99)
Total Bilirubin: 0.8 mg/dL (ref 0.3–1.2)

## 2012-01-12 LAB — CLOSTRIDIUM DIFFICILE BY PCR: Toxigenic C. Difficile by PCR: POSITIVE — AB

## 2012-01-12 LAB — GLUCOSE, CAPILLARY
Glucose-Capillary: 247 mg/dL — ABNORMAL HIGH (ref 70–99)
Glucose-Capillary: 163 mg/dL — ABNORMAL HIGH (ref 70–99)

## 2012-01-12 LAB — CBC
Hemoglobin: 12.8 g/dL (ref 12.0–15.0)
MCH: 31.4 pg (ref 26.0–34.0)
MCHC: 35.7 g/dL (ref 30.0–36.0)
Platelets: 195 10*3/uL (ref 150–400)

## 2012-01-12 LAB — MAGNESIUM: Magnesium: 1.4 mg/dL — ABNORMAL LOW (ref 1.5–2.5)

## 2012-01-12 LAB — LACTIC ACID, PLASMA: Lactic Acid, Venous: 1.1 mmol/L (ref 0.5–2.2)

## 2012-01-12 MED ORDER — METHYLPREDNISOLONE SODIUM SUCC 125 MG IJ SOLR
60.0000 mg | Freq: Two times a day (BID) | INTRAMUSCULAR | Status: DC
  Administered 2012-01-12: 60 mg via INTRAVENOUS
  Filled 2012-01-12: qty 2

## 2012-01-12 MED ORDER — INSULIN GLARGINE 100 UNIT/ML ~~LOC~~ SOLN
10.0000 [IU] | Freq: Every day | SUBCUTANEOUS | Status: DC
  Administered 2012-01-12 – 2012-01-14 (×3): 10 [IU] via SUBCUTANEOUS

## 2012-01-12 MED ORDER — ALBUTEROL SULFATE (5 MG/ML) 0.5% IN NEBU: 2.5000 mg | INHALATION_SOLUTION | RESPIRATORY_TRACT | Status: DC | PRN

## 2012-01-12 MED ORDER — INSULIN ASPART 100 UNIT/ML ~~LOC~~ SOLN: 0.0000 [IU] | Freq: Every day | SUBCUTANEOUS | Status: DC

## 2012-01-12 MED ORDER — SODIUM CHLORIDE 0.9 % IJ SOLN
INTRAMUSCULAR | Status: AC
  Filled 2012-01-12: qty 6

## 2012-01-12 MED ORDER — ACETAMINOPHEN 650 MG RE SUPP: 650.0000 mg | Freq: Four times a day (QID) | RECTAL | Status: DC | PRN

## 2012-01-12 MED ORDER — INSULIN ASPART 100 UNIT/ML ~~LOC~~ SOLN
0.0000 [IU] | Freq: Three times a day (TID) | SUBCUTANEOUS | Status: DC
  Administered 2012-01-13: 11 [IU] via SUBCUTANEOUS
  Administered 2012-01-14 (×2): 4 [IU] via SUBCUTANEOUS
  Administered 2012-01-15: 11 [IU] via SUBCUTANEOUS
  Administered 2012-01-16: 4 [IU] via SUBCUTANEOUS
  Administered 2012-01-16 – 2012-01-17 (×2): 3 [IU] via SUBCUTANEOUS
  Administered 2012-01-18 – 2012-01-19 (×2): 4 [IU] via SUBCUTANEOUS
  Administered 2012-01-19: 11 [IU] via SUBCUTANEOUS

## 2012-01-12 MED ORDER — MAGNESIUM SULFATE 40 MG/ML IJ SOLN
2.0000 g | Freq: Once | INTRAMUSCULAR | Status: AC
  Administered 2012-01-12: 2 g via INTRAVENOUS
  Filled 2012-01-12: qty 50

## 2012-01-12 MED ORDER — POTASSIUM CHLORIDE 10 MEQ/100ML IV SOLN
INTRAVENOUS | Status: AC
  Administered 2012-01-12: 10 meq via INTRAVENOUS
  Filled 2012-01-12: qty 100

## 2012-01-12 MED ORDER — POTASSIUM CHLORIDE CRYS ER 10 MEQ PO TBCR
10.0000 meq | EXTENDED_RELEASE_TABLET | Freq: Once | ORAL | Status: AC
  Administered 2012-01-12: 10 meq via ORAL
  Filled 2012-01-12: qty 1

## 2012-01-12 MED ORDER — FLORA-Q PO CAPS
2.0000 | ORAL_CAPSULE | Freq: Three times a day (TID) | ORAL | Status: DC
  Administered 2012-01-12 – 2012-01-19 (×20): 2 via ORAL
  Filled 2012-01-12: qty 1
  Filled 2012-01-12 (×5): qty 2
  Filled 2012-01-12: qty 1
  Filled 2012-01-12 (×7): qty 2
  Filled 2012-01-12: qty 1
  Filled 2012-01-12 (×4): qty 2
  Filled 2012-01-12: qty 1
  Filled 2012-01-12: qty 2

## 2012-01-12 MED ORDER — METOPROLOL SUCCINATE ER 50 MG PO TB24
50.0000 mg | ORAL_TABLET | Freq: Every day | ORAL | Status: DC
  Administered 2012-01-12 – 2012-01-19 (×8): 50 mg via ORAL
  Filled 2012-01-12 (×10): qty 1

## 2012-01-12 MED ORDER — SODIUM CHLORIDE 0.9 % IJ SOLN
INTRAMUSCULAR | Status: AC
  Administered 2012-01-12: 03:00:00
  Filled 2012-01-12: qty 9

## 2012-01-12 MED ORDER — CIPROFLOXACIN IN D5W 400 MG/200ML IV SOLN
400.0000 mg | Freq: Two times a day (BID) | INTRAVENOUS | Status: DC
  Filled 2012-01-12 (×3): qty 200

## 2012-01-12 MED ORDER — PANTOPRAZOLE SODIUM 40 MG IV SOLR
40.0000 mg | INTRAVENOUS | Status: DC
  Administered 2012-01-12: 40 mg via INTRAVENOUS
  Filled 2012-01-12: qty 40

## 2012-01-12 MED ORDER — ALPRAZOLAM 0.25 MG PO TABS: 0.2500 mg | ORAL_TABLET | Freq: Two times a day (BID) | ORAL | Status: DC | PRN

## 2012-01-12 MED ORDER — SODIUM CHLORIDE 0.9 % IJ SOLN
3.0000 mL | Freq: Two times a day (BID) | INTRAMUSCULAR | Status: DC
  Administered 2012-01-12 – 2012-01-19 (×12): 3 mL via INTRAVENOUS
  Filled 2012-01-12 (×9): qty 3

## 2012-01-12 MED ORDER — DIPHENHYDRAMINE HCL 25 MG PO CAPS
50.0000 mg | ORAL_CAPSULE | Freq: Every day | ORAL | Status: DC
  Administered 2012-01-12: 50 mg via ORAL
  Filled 2012-01-12 (×2): qty 1

## 2012-01-12 MED ORDER — DESVENLAFAXINE SUCCINATE ER 50 MG PO TB24
50.0000 mg | ORAL_TABLET | Freq: Every day | ORAL | Status: DC
  Administered 2012-01-12 – 2012-01-19 (×8): 50 mg via ORAL
  Filled 2012-01-12 (×10): qty 1

## 2012-01-12 MED ORDER — POTASSIUM CHLORIDE CRYS ER 20 MEQ PO TBCR
40.0000 meq | EXTENDED_RELEASE_TABLET | Freq: Once | ORAL | Status: AC
  Administered 2012-01-12: 40 meq via ORAL
  Filled 2012-01-12: qty 2

## 2012-01-12 MED ORDER — INSULIN ASPART 100 UNIT/ML ~~LOC~~ SOLN
0.0000 [IU] | Freq: Three times a day (TID) | SUBCUTANEOUS | Status: DC
  Administered 2012-01-12 (×3): 5 [IU] via SUBCUTANEOUS

## 2012-01-12 MED ORDER — POTASSIUM CHLORIDE IN NACL 20-0.9 MEQ/L-% IV SOLN
INTRAVENOUS | Status: DC
  Administered 2012-01-12 – 2012-01-15 (×7): via INTRAVENOUS

## 2012-01-12 MED ORDER — HYDROMORPHONE HCL PF 1 MG/ML IJ SOLN
1.0000 mg | INTRAMUSCULAR | Status: DC | PRN
  Administered 2012-01-12 – 2012-01-14 (×5): 1 mg via INTRAVENOUS
  Filled 2012-01-12 (×5): qty 1

## 2012-01-12 MED ORDER — METRONIDAZOLE IN NACL 5-0.79 MG/ML-% IV SOLN
500.0000 mg | Freq: Three times a day (TID) | INTRAVENOUS | Status: DC
  Administered 2012-01-12 – 2012-01-15 (×9): 500 mg via INTRAVENOUS
  Filled 2012-01-12 (×11): qty 100

## 2012-01-12 MED ORDER — ACETAMINOPHEN 325 MG PO TABS
650.0000 mg | ORAL_TABLET | Freq: Four times a day (QID) | ORAL | Status: DC | PRN
  Administered 2012-01-12: 650 mg via ORAL
  Filled 2012-01-12: qty 2

## 2012-01-12 NOTE — Progress Notes (Signed)
Chart reviewed.  Subjective: Pain a little bit better. Still having loose stool. Nauseated. No vomiting. Asking for something for sleep. Feels weak.  Objective: Vital signs in last 24 hours: Filed Vitals:   01/12/12 0320 01/12/12 0522 01/12/12 1230 01/12/12 1504  BP:  132/76  120/75  Pulse:  120  81  Temp:  102.7 F (39.3 C) 97.4 F (36.3 C) 97.3 F (36.3 C)  TempSrc:  Oral    Resp:  20  18  Height:      Weight:      SpO2: 97% 92%  92%   Weight change:   Intake/Output Summary (Last 24 hours) at 01/12/12 1710 Last data filed at 01/12/12 1700  Gross per 24 hour  Intake 1639.17 ml  Output      0 ml  Net 1639.17 ml   Physical Exam: Gen.: Weak appearing Lungs clear to auscultation bilaterally without wheeze rhonchi or rales Cardiovascular regular rate rhythm without murmurs gallops rubs Abdomen obese, soft, mild tenderness Extremities sequential compression devices in place. No clubbing cyanosis or edema.  Lab Results: Basic Metabolic Panel:  Lab 43/15/40 0611 01/11/12 2000 01/09/12 1646  NA 125* 122* --  K 3.5 2.7* --  CL 90* 79* --  CO2 25 33* --  GLUCOSE 241* 251* --  BUN 7 8 --  CREATININE 0.66 0.82 --  CALCIUM 7.1* 8.3* --  MG 1.4* -- 1.4*  PHOS -- -- --   Liver Function Tests:  Lab 01/12/12 0611  AST 73*  ALT 26  ALKPHOS 71  BILITOT 0.8  PROT 5.7*  ALBUMIN 2.2*   No results found for this basename: LIPASE:2,AMYLASE:2 in the last 168 hours No results found for this basename: AMMONIA:2 in the last 168 hours CBC:  Lab 01/12/12 0611 01/11/12 2000 01/09/12 1646  WBC 14.9* 15.7* --  NEUTROABS -- 14.4* 9.4*  HGB 12.8 13.3 --  HCT 35.9* 37.4 --  MCV 88.2 88.4 --  PLT 195 259 --   Cardiac Enzymes: No results found for this basename: CKTOTAL:3,CKMB:3,CKMBINDEX:3,TROPONINI:3 in the last 168 hours BNP: No results found for this basename: PROBNP:3 in the last 168 hours D-Dimer: No results found for this basename: DDIMER:2 in the last 168  hours CBG:  Lab 01/12/12 1556 01/12/12 1143 01/12/12 0820 01/11/12 1927  GLUCAP 241* 247* 231* 256*   Hemoglobin A1C: No results found for this basename: HGBA1C in the last 168 hours Fasting Lipid Panel: No results found for this basename: CHOL,HDL,LDLCALC,TRIG,CHOLHDL,LDLDIRECT in the last 168 hours Thyroid Function Tests: No results found for this basename: TSH,T4TOTAL,FREET4,T3FREE,THYROIDAB in the last 168 hours Coagulation: No results found for this basename: LABPROT:4,INR:4 in the last 168 hours Anemia Panel: No results found for this basename: VITAMINB12,FOLATE,FERRITIN,TIBC,IRON,RETICCTPCT in the last 168 hours Urine Drug Screen: Drugs of Abuse  No results found for this basename: labopia, cocainscrnur, labbenz, amphetmu, thcu, labbarb    Alcohol Level: No results found for this basename: ETH:2 in the last 168 hours Urinalysis:  Lab 01/11/12 2225  COLORURINE YELLOW  LABSPEC <1.005*  PHURINE 5.5  GLUCOSEU NEGATIVE  HGBUR NEGATIVE  BILIRUBINUR NEGATIVE  KETONESUR 15*  PROTEINUR NEGATIVE  UROBILINOGEN 0.2  NITRITE NEGATIVE  LEUKOCYTESUR NEGATIVE   Micro Results: Recent Results (from the past 240 hour(s))  CULTURE, BLOOD (ROUTINE X 2)     Status: Normal (Preliminary result)   Collection Time   01/12/12  7:37 AM      Component Value Range Status Comment   Specimen Description BLOOD RIGHT HAND  Final    Special Requests BOTTLES DRAWN AEROBIC AND ANAEROBIC 6CC   Final    Culture PENDING   Incomplete    Report Status PENDING   Incomplete   CULTURE, BLOOD (ROUTINE X 2)     Status: Normal (Preliminary result)   Collection Time   01/12/12  7:37 AM      Component Value Range Status Comment   Specimen Description BLOOD LEFT HAND   Final    Special Requests BOTTLES DRAWN AEROBIC AND ANAEROBIC [facility]   Final    Culture PENDING   Incomplete    Report Status PENDING   Incomplete   CLOSTRIDIUM DIFFICILE BY PCR     Status: Abnormal   Collection Time   01/12/12  8:39 AM       Component Value Range Status Comment   C difficile by pcr POSITIVE (*) NEGATIVE  Final    Studies/Results: Ct Abdomen Pelvis W Contrast  01/11/2012  *RADIOLOGY REPORT*  Clinical Data: Ulcerative colitis.  Abdominal pain and diarrhea. Anorexia.  CT ABDOMEN AND PELVIS WITH CONTRAST  Technique:  Multidetector CT imaging of the abdomen and pelvis was performed following the standard protocol during bolus administration of intravenous contrast.  Contrast: 173m OMNIPAQUE IOHEXOL 300 MG/ML  SOLN  Comparison: None.  Findings: Moderate to severe diffuse colitis is seen with pericolonic inflammatory changes, consistent with history of ulcerative colitis.  There is no evidence of pneumatosis or portal venous gas.  No evidence of abscess.  A small amount of free fluid is seen in the pelvis, paracolic gutters, and right perihepatic space.  No evidence of dilated small bowel loops.  Surgical clips is seen from prior cholecystectomy.  The abdominal parenchymal organs are normal in appearance.  No evidence of hydronephrosis.  No soft tissue masses or lymphadenopathy identified.  Previous hysterectomy noted.  IMPRESSION:  1.  Moderate to severe diffuse colitis, consistent with known history of ulcerative colitis. 2.  Mild ascites.  No evidence of abscess, pneumatosis, or pneumoperitoneum.  Original Report Authenticated By: JMarlaine Hind M.D.   Scheduled Meds:   . sodium chloride   Intravenous Once  . acetaminophen  1,000 mg Oral Once  . desvenlafaxine  50 mg Oral Daily  . diphenhydrAMINE  50 mg Oral QHS  . Flora-Q  2 capsule Oral TID  . insulin aspart  0-15 Units Subcutaneous TID WC  . insulin aspart  0-5 Units Subcutaneous QHS  . magnesium sulfate 1 - 4 g bolus IVPB  2 g Intravenous Once  . metoprolol succinate  50 mg Oral Daily  . metronidazole  500 mg Intravenous Q8H  . ondansetron (ZOFRAN) IV  4 mg Intravenous Once  . potassium chloride  10 mEq Oral Once  . potassium chloride  10 mEq Intravenous Q1 Hr x 4   . potassium chloride SA  40 mEq Oral Once  . potassium chloride  40 mEq Oral Once  . sodium chloride  1,000 mL Intravenous Once  . sodium chloride  3 mL Intravenous Q12H  . sodium chloride      . sodium chloride      . DISCONTD: ciprofloxacin  400 mg Intravenous Q12H  . DISCONTD: methylPREDNISolone (SOLU-MEDROL) injection  60 mg Intravenous Q12H  . DISCONTD: pantoprazole (PROTONIX) IV  40 mg Intravenous Q24H  . DISCONTD: sodium chloride  500 mL Intravenous STAT   Continuous Infusions:   . 0.9 % NaCl with KCl 20 mEq / L 125 mL/hr at 01/12/12 1638  . DISCONTD: sodium chloride  PRN Meds:.acetaminophen, acetaminophen, ALPRAZolam, HYDROmorphone, iohexol, ondansetron (ZOFRAN) IV, ondansetron, DISCONTD: albuterol Assessment/Plan: Principal Problem:  *C. difficile colitis Active Problems:  DM (diabetes mellitus), type 2, uncontrolled  HYPERTENSION  Hyponatremia  Hypokalemia  Hypomagnesemia  GERD  ULCERATIVE COLITIS  Insomnia  Dehydration  Agree with stopping steroids and Cipro. Hypokalemia has improved but is still with hypovolemic hyponatremia. Continue clear liquids until nausea and pain improved. Stop proton pump inhibitor in the setting of a C. difficile infection. Increase insulin coverage. Diphenhydramine nightly for chronic insomnia. Appreciate GI.   LOS: 1 day   Rayn Enderson L 01/12/2012, 5:10 PM

## 2012-01-12 NOTE — Plan of Care (Signed)
Problem: Phase I Progression Outcomes Goal: OOB as tolerated unless otherwise ordered Outcome: Progressing Pt up with assistance to the bathroom, per orders.

## 2012-01-12 NOTE — Consult Note (Signed)
Referring Provider: Dr. [place] Pink Primary Care Physician:  Sherrie Mustache, MD, MD Primary Gastroenterologist:  Dr.  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 Reported fever and chills at home but not well documented. Here, she had documented a temperature to 102 .  Seen by Dr. Laural [name] recently. Metformin discontinued; aspirin decreased to 81 mg daily. Cortisone enema therapy also recently instituted. Reportedly has lost about 14 pounds over the past several weeks. She is felt to have a flare in January and February of this year. CRP 0.84 back in February; it's now 12. CT of the abdomen last evening demonstrated diffuse thickening of the rectum and colon with free peritoneal fluid. No pneumatosis perforation or abscess.  Intolerance to multiple oral mesalamine preparations in the past.  She's also been noted to be significantly hypomagnesemic and hypokalemic recently. She has been started on antibiotics in the way of Cipro, Flagyl and Solu-Medrol empirically. I was called to see this lady.  Patient denies nonsteroidals aside from a one aspirin daily. No sick contacts recently. No antibiotics recently. She is on chronic acid suppression therapy in the way of esomeprazole.  Stool studies have been ordered. Her clostridium difficile toxin assay came back positive this morning.  Past Medical History  Diagnosis Date  . MENOPAUSAL DISORDER   . REDUCTION MAMMOPLASTY, HX OF 1994    bilateral  . ULCERATIVE COLITIS   . HYPERTENSION   . GERD   . DIABETES MELLITUS, TYPE II   . Ulcerative colitis     Past Surgical History  Procedure Date  . Abdominal hysterectomy 1980    total, with bladder tack  . Breast surgery 1994    Bilateral, due to fibrocystic breast changes  . Rectocele repair 1990  .  Carpal tunnel release 1996    Right  . Right knee arthroscopy 01/1999    then again 10/2000  . L4-l5 diskectomy 08/1999  . Cholecystectomy     Prior to Admission medications   Medication Sig Start Date End Date Taking? Authorizing Provider  ALPRAZolam (XANAX) 0.25 MG tablet Take 0.25 mg by mouth. prn    Yes Historical Provider, MD  aspirin EC 81 MG tablet Take 81 mg by mouth daily.   Yes Historical Provider, MD  desvenlafaxine (PRISTIQ) 50 MG 24 hr tablet Take 50 mg by mouth daily.     Yes Historical Provider, MD  esomeprazole (NEXIUM) 40 MG capsule Take 40 mg by mouth. One twice daily   Yes Historical Provider, MD  estradiol (ESTRACE) 1 MG tablet Take 1 mg by mouth daily.     Yes Historical Provider, MD  glipiZIDE (GLUCOTROL) 10 MG tablet Take 10 mg by mouth daily.   Yes Historical Provider, MD  hydrochlorothiazide 50 MG tablet Take 1 tablet (50 mg total) by mouth daily. 01/24/11  Yes Rowe Clack, MD  hydrocortisone (CORTENEMA) 100 MG/60ML enema Place 100 mg rectally at bedtime.  01/04/12  Yes Historical Provider, MD  linagliptin (TRADJENTA) 5 MG TABS tablet Take 5 mg by mouth daily.     Yes Historical Provider, MD  metoprolol (TOPROL-XL) 100 MG 24 hr tablet 1 tablet by mouth every morning, 1/2 tablet every evening  01/24/11  Yes Rowe Clack, MD  ondansetron (ZOFRAN) 4 MG tablet Take 1 tablet (4 mg total) by mouth daily as needed for  nausea. 01/01/12 12/31/12 Yes Terri L Setzer, NP  glucose blood test strip And lancets 1 daily.  250.00 05/30/11   Renato Shin, MD  hydrocortisone (CORTENEMA) 100 MG/60ML enema Place 1 enema (100 mg total) rectally at bedtime. 10/04/11 10/14/11  Butch Penny, NP    Current Facility-Administered Medications  Medication Dose Route Frequency Provider Last Rate Last Dose  . 0.9 %  sodium chloride infusion   Intravenous Once Janice Norrie, MD 1,000 mL/hr at 01/11/12 2102    . 0.9 % NaCl with KCl 20 mEq/ L  infusion   Intravenous Continuous Bonnielee Haff, MD 125  mL/hr at 01/12/12 0534    . acetaminophen (TYLENOL) tablet 650 mg  650 mg Oral Q6H PRN Bonnielee Haff, MD   650 mg at 01/12/12 0547   Or  . acetaminophen (TYLENOL) suppository 650 mg  650 mg Rectal Q6H PRN Bonnielee Haff, MD      . acetaminophen (TYLENOL) tablet 1,000 mg  1,000 mg Oral Once Janice Norrie, MD   1,000 mg at 01/11/12 2102  . ALPRAZolam Duanne Moron) tablet 0.25 mg  0.25 mg Oral BID PRN Bonnielee Haff, MD      . ciprofloxacin (CIPRO) IVPB 400 mg  400 mg Intravenous Q12H Bonnielee Haff, MD      . desvenlafaxine (PRISTIQ) 24 hr tablet 50 mg  50 mg Oral Daily Bonnielee Haff, MD   50 mg at 01/12/12 1007  . HYDROmorphone (DILAUDID) injection 1 mg  1 mg Intravenous Q3H PRN Bonnielee Haff, MD      . insulin aspart (novoLOG) injection 0-15 Units  0-15 Units Subcutaneous TID WC Bonnielee Haff, MD   5 Units at 01/12/12 0854  . insulin aspart (novoLOG) injection 0-5 Units  0-5 Units Subcutaneous QHS Bonnielee Haff, MD      . iohexol (OMNIPAQUE) 300 MG/ML solution 100 mL  100 mL Intravenous Once PRN Medication Radiologist, MD   100 mL at 01/11/12 2232  . magnesium sulfate IVPB 2 g 50 mL  2 g Intravenous Once Bonnielee Haff, MD   2 g at 01/12/12 0542  . methylPREDNISolone sodium succinate (SOLU-MEDROL) 125 mg/2 mL injection 60 mg  60 mg Intravenous Q12H Bonnielee Haff, MD   60 mg at 01/12/12 0528  . metoprolol succinate (TOPROL-XL) 24 hr tablet 50 mg  50 mg Oral Daily Bonnielee Haff, MD   50 mg at 01/12/12 0854  . metroNIDAZOLE (FLAGYL) IVPB 500 mg  500 mg Intravenous Q8H Bonnielee Haff, MD   500 mg at 01/12/12 1007  . ondansetron (ZOFRAN) injection 4 mg  4 mg Intravenous Once Janice Norrie, MD   4 mg at 01/11/12 2031  . ondansetron (ZOFRAN) tablet 4 mg  4 mg Oral Q6H PRN Bonnielee Haff, MD   4 mg at 01/12/12 0602   Or  . ondansetron (ZOFRAN) injection 4 mg  4 mg Intravenous Q6H PRN Bonnielee Haff, MD   4 mg at 01/11/12 2343  . pantoprazole (PROTONIX) injection 40 mg  40 mg Intravenous Q24H Bonnielee Haff,  MD   40 mg at 01/12/12 0229  . potassium chloride (K-DUR,KLOR-CON) CR tablet 10 mEq  10 mEq Oral Once Bonnielee Haff, MD   10 mEq at 01/12/12 0406  . potassium chloride 10 mEq in 100 mL IVPB  10 mEq Intravenous Q1 Hr x 4 Iva L Tomi Bamberger, MD 100 mL/hr at 01/12/12 0407 10 mEq at 01/12/12 0407  . potassium chloride SA (K-DUR,KLOR-CON) CR tablet 40 mEq  40 mEq Oral Once Iva L  Tomi Bamberger, MD   40 mEq at 01/11/12 2137  . potassium chloride SA (K-DUR,KLOR-CON) CR tablet 40 mEq  40 mEq Oral Once Bonnielee Haff, MD   40 mEq at 01/12/12 0238  . sodium chloride 0.9 % bolus 1,000 mL  1,000 mL Intravenous Once Janice Norrie, MD   1,000 mL at 01/11/12 2027  . sodium chloride 0.9 % injection 3 mL  3 mL Intravenous Q12H Bonnielee Haff, MD      . sodium chloride 0.9 % injection           . sodium chloride 0.9 % injection           . DISCONTD: 0.9 %  sodium chloride infusion   Intravenous Continuous Janice Norrie, MD      . DISCONTD: albuterol (PROVENTIL) (5 MG/ML) 0.5% nebulizer solution 2.5 mg  2.5 mg Nebulization Q2H PRN Bonnielee Haff, MD      . DISCONTD: sodium chloride 0.9 % bolus 500 mL  500 mL Intravenous STAT Janice Norrie, MD        Allergies as of 01/11/2012 - Review Complete 01/11/2012  Allergen Reaction Noted  . Chloraseptic  06/26/2011  . Colesevelam  10/18/2010  . Nisoldipine  04/12/2010  . Oxycodone-acetaminophen    . Prilosec (omeprazole)  08/14/2011    Family History  Problem Relation Age of Onset  . Diabetes Mother   . Hypertension Mother   . Heart attack Father     Mid 42's  . Heart disease Father   . Lung disease Father     spot on lung; had lung surgery  . Alcohol abuse Other   . Hypertension Son   . Diabetes Son     History   Social History  . Marital Status: Married    Spouse Name: N/A    Number of Children: N/A  . Years of Education: N/A   Occupational History  . Not on file.   Social History Main Topics  . Smoking status: Never Smoker   . 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 Alcohol Use: Yes     Rare  . Drug Use: No  . Sexually Active: Not on file   Other Topics Concern  . Not on file   Social History Narrative  . No narrative on file    Review of Systems: Gen: CV: Denies chest pain, angina, palpitations, syncope, orthopnea, PND, peripheral edema, and claudication. Resp: Denies dyspnea at rest, dyspnea with exercise, cough, sputum, wheezing, coughing up blood, and pleurisy. GI: Denies vomiting blood, jaundice, and fecal incontinence.   Denies dysphagia or odynophagia. Derm: Denies rash, itching, dry skin, hives, moles, warts, or unhealing ulcers.  Psych: Denies depression, anxiety, memory loss, suicidal ideation, hallucinations, paranoia, and confusion. Heme: Denies bruising, bleeding, and enlarged lymph nodes.   Physical Exam: Vital signs in last 24 hours: Temp:  [99.2 F (37.3 C)-102.7 F (39.3 C)] 102.7 F (39.3 C) (04/14 0522) Pulse Rate:  [112-120] 120  (04/14 0522) Resp:  [20-24] 20  (04/14 0522) BP: (104-137)/(64-79) 132/76 mmHg (04/14 0522) SpO2:  [92 %-99 %] 92 % (04/14 0522) Weight:  [170 lb (77.111 kg)] 170 lb (77.111 kg) (04/13 1901) Last BM Date: 01/12/12 General:  Pleasant 66 year old lady appears not to feel well but does not appear toxic at this time. Head:  Normocephalic and atraumatic. Eyes:  Sclera clear,  no icterus.   Conjunctiva pink. Ears:  Normal auditory acuity. Nose:  No deformity, discharge,  or lesions. Mouth:  No deformity or lesions, dentition normal. Neck:  Supple; no masses or thyromegaly. Lungs:  Clear throughout to auscultation.   No wheezes, crackles, or rhonchi. No acute distress. Heart:  Regular rate and rhythm; no murmurs, clicks, rubs,  or gallops. Abdomen: Msk:  Symmetrical without gross deformities. Normal posture. Pulses:  Normal pulses  noted. Extremities:  Without clubbing or edema. Neurologic:  Alert and  oriented x4;  grossly normal neurologically. Skin:  Intact without significant lesions or rashes. Cervical Nodes:  No significant cervical adenopathy.  Intake/Output from previous day: 04/13 0701 - 04/14 0700 In: 10 [I.V.:10] Out: -  Intake/Output this shift:    Lab Results:  Basename 01/12/12 0611 01/11/12 2000 01/09/12 1646  WBC 14.9* 15.7* 14.2*  HGB 12.8 13.3 13.7  HCT 35.9* 37.4 38.9  PLT 195 259 332   BMET  Basename 01/12/12 0611 01/11/12 2000 01/09/12 1646  NA 125* 122* 132*  K 3.5 2.7* 2.6*  CL 90* 79* 83*  CO2 25 33* 34*  GLUCOSE 241* 251* 102*  BUN 7 8 10   CREATININE 0.66 0.82 0.71  CALCIUM 7.1* 8.3* 9.0   LFT  Basename 01/12/12 0611  PROT 5.7*  ALBUMIN 2.2*  AST 73*  ALT 26  ALKPHOS 71  BILITOT 0.8  BILIDIR --  IBILI --    Studies/Results: Ct Abdomen Pelvis W Contrast  01/11/2012  *RADIOLOGY REPORT*  Clinical Data: Ulcerative colitis.  Abdominal pain and diarrhea. Anorexia.  CT ABDOMEN AND PELVIS WITH CONTRAST  Technique:  Multidetector CT imaging of the abdomen and pelvis was performed following the standard protocol during bolus administration of intravenous contrast.  Contrast: 1106m OMNIPAQUE IOHEXOL 300 MG/ML  SOLN  Comparison: None.  Findings: Moderate to severe diffuse colitis is seen with pericolonic inflammatory changes, consistent with history of ulcerative colitis.  There is no evidence of pneumatosis or portal venous gas.  No evidence of abscess.  A small amount of free fluid is seen in the pelvis, paracolic gutters, and right perihepatic space.  No evidence of dilated small bowel loops.  Surgical clips is seen from prior cholecystectomy.  The abdominal parenchymal organs are normal in appearance.  No evidence of hydronephrosis.  No soft tissue masses or lymphadenopathy identified.  Previous hysterectomy noted.  IMPRESSION:  1.  Moderate to severe diffuse colitis, consistent  with known history of ulcerative colitis. 2.  Mild ascites.  No evidence of abscess, pneumatosis, or pneumoperitoneum.  Original Report Authenticated By: JMarlaine Hind M.D.    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 Her Clostridium difficile toxin assays come back positive which likely explains her illness.  She is on chronic acid suppression therapy.  Recommendations: Agree with symptomatic treatment, repletion of magnesium and potassium. She is on IV Flagyl at this time. We'll see how she responds to this regimen over the next 24-48 hours; if no significant improvement noted , I would have a low threshold for adding oral vancomycin to her regimen.  Will start probiotic therapy. Will stop Solu-Medrol and Cipro at this time.  I will alert Dr. RLaural Goldenof this lady's admission.  Thanks for the consult.  LOS: 1 day   RManus Rudd 01/12/2012, 10:19 AM

## 2012-01-13 ENCOUNTER — Telehealth: Payer: Self-pay | Admitting: Internal Medicine

## 2012-01-13 DIAGNOSIS — A0472 Enterocolitis due to Clostridium difficile, not specified as recurrent: Secondary | ICD-10-CM

## 2012-01-13 DIAGNOSIS — K5190 Ulcerative colitis, unspecified, without complications: Secondary | ICD-10-CM

## 2012-01-13 LAB — BASIC METABOLIC PANEL
Calcium: 7.1 mg/dL — ABNORMAL LOW (ref 8.4–10.5)
GFR calc Af Amer: >90 mL/min (ref >90)
GFR calc non Af Amer: >90 mL/min (ref >90)
Potassium: 4.2 mEq/L (ref 3.5–5.1)
Sodium: 132 mEq/L — ABNORMAL LOW (ref 135–145)

## 2012-01-13 LAB — GLUCOSE, CAPILLARY
Glucose-Capillary: 95 mg/dL (ref 70–99)
Glucose-Capillary: 137 mg/dL — ABNORMAL HIGH (ref 70–99)

## 2012-01-13 MED ORDER — SODIUM CHLORIDE 0.9 % IJ SOLN
INTRAMUSCULAR | Status: AC
  Administered 2012-01-13: 11:00:00
  Filled 2012-01-13: qty 3

## 2012-01-13 MED ORDER — ONDANSETRON HCL 4 MG PO TABS
4.0000 mg | ORAL_TABLET | ORAL | Status: DC | PRN
  Administered 2012-01-17: 4 mg via ORAL
  Filled 2012-01-13: qty 1

## 2012-01-13 MED ORDER — TRAZODONE HCL 50 MG PO TABS
50.0000 mg | ORAL_TABLET | Freq: Every day | ORAL | Status: DC
  Administered 2012-01-13: 50 mg via ORAL
  Filled 2012-01-13: qty 1

## 2012-01-13 MED ORDER — VANCOMYCIN 50 MG/ML ORAL SOLUTION
250.0000 mg | Freq: Four times a day (QID) | ORAL | Status: DC
  Administered 2012-01-13 – 2012-01-19 (×24): 250 mg via ORAL
  Filled 2012-01-13 (×31): qty 5

## 2012-01-13 MED ORDER — MAGNESIUM SULFATE IN D5W 10-5 MG/ML-% IV SOLN
1.0000 g | Freq: Every day | INTRAVENOUS | Status: DC
  Administered 2012-01-13 – 2012-01-15 (×3): 1 g via INTRAVENOUS
  Filled 2012-01-13 (×6): qty 100

## 2012-01-13 MED ORDER — ONDANSETRON HCL 4 MG/2ML IJ SOLN
4.0000 mg | Freq: Four times a day (QID) | INTRAMUSCULAR | Status: DC | PRN
  Administered 2012-01-14 – 2012-01-17 (×7): 4 mg via INTRAVENOUS
  Filled 2012-01-13 (×7): qty 2

## 2012-01-13 NOTE — Progress Notes (Signed)
Subjective; Patient states she feels very weak and worn out; she has no appetite And felt nauseated on smelling food this morning. She is keeping clear liquids down. She had 2 bowel movements in the last 12 hours. She she does not recall that she has taken antibiotics in at least 7-8 months. Objective; BP 160/99  Pulse 96  Temp(Src) 98.3 F (36.8 C) (Oral)  Resp 24  Ht 5' 2"  (1.575 m)  Wt 170 lb (77.111 kg)  BMI 31.09 kg/m2  SpO2 97%  Abdomen is full with mild generalized tenderness. Lab data; Serum sodium 132, potassium 4.2 chloride 98 CO2 29, glucose 127, BUN 8, creatinine 0.61 and calcium 7.1. Serum albumin from yesterday was 2.2 and AST 73 with ALT 26. Stool culture; no intrahepatic pathogens. Stool C. difficile by PCR is positive. Abdominal pelvic CT reviewed with Dr. Thornton Papas. Diffuse changes of colitis and pericolonic inflammatory changes and scant amount of pelvic fluid. Assessment; Acute illness secondary to C. difficile colitis. No prior reported use of antibiotics. It remains to be determined if her illness is due to combination of C. difficile and active UC. If she does not rapidly improve with therapy will consider diagnostic flexible sigmoidoscopy. Mildly elevated AST secondary of nonhepatic origin. Electrolyte abnormalities; hypokalemia improved, mild hyponatremia. Mild hypomagnesemia Recommendations; Vancomycin 250 mg po q 6 hr. Continue IV metronidazole for now. Will DC metronidazole once we have documented that she is able to keep vancomycin down. magnessium sulfate 1 g and IV fluids x2 does

## 2012-01-13 NOTE — Telephone Encounter (Signed)
[name], patient of Dr. Olevia Perches, with a history of distal UC has been admitted to the hospital over the weekend, ill, with Clostridium difficile infection.

## 2012-01-13 NOTE — Progress Notes (Signed)
   CARE MANAGEMENT NOTE 01/13/2012  Patient:  [name], [name]   Account Number:  [id number]  Date Initiated:  01/13/2012  Documentation initiated by:  Claretha Cooper  Subjective/Objective Assessment:   Pt admitted from home with her spouse. Admitted with ulcerative colitis flair and C-dif.     Action/Plan:   Spoke to pt and spouse at bedside. She is hopeful that she will not need HH assistance but she is not opposed to it if needed.   Anticipated DC Date:  01/15/2012   Anticipated DC Plan:  [place]  CM consult      Choice offered to / List presented to:             Status of service:  In process, will continue to follow Medicare Important Message given?   (If response is "NO", the following Medicare IM given date fields will be blank) Date Medicare IM given:   Date Additional Medicare IM given:    Discharge Disposition:    Per UR Regulation:    If discussed at Long Length of Stay Meetings, dates discussed:    Comments:  01/13/12 [place]

## 2012-01-13 NOTE — Progress Notes (Signed)
Discussed with Dr. Melony Overly.   Subjective: Not feeling much better. Did not have restful night. Still nauseated. Too many interruptions.  Objective: Vital signs in last 24 hours: Filed Vitals:   01/12/12 1504 01/12/12 2106 01/13/12 0516 01/13/12 1523  BP: 120/75 122/78 116/75 160/99  Pulse: 81 79 96 96  Temp: 97.3 F (36.3 C) 97.6 F (36.4 C) 98.7 F (37.1 C) 98.3 F (36.8 C)  TempSrc:  Oral Oral Oral  Resp: 18 18 18 24   Height:      Weight:      SpO2: 92% 93% 92% 97%   Weight change:   Intake/Output Summary (Last 24 hours) at 01/13/12 1607 Last data filed at 01/13/12 0800  Gross per 24 hour  Intake 3253.17 ml  Output      0 ml  Net 3253.17 ml   Physical Exam: Gen.: Weak appearing Lungs clear to auscultation bilaterally without wheeze rhonchi or rales Cardiovascular regular rate rhythm without murmurs gallops rubs Abdomen obese, soft, mild tenderness Extremities sequential compression devices in place. No clubbing cyanosis or edema.  Lab Results: Basic Metabolic Panel:  Lab 73/71/06 0547 01/12/12 0611 01/09/12 1646  NA 132* 125* --  K 4.2 3.5 --  CL 98 90* --  CO2 29 25 --  GLUCOSE 127* 241* --  BUN 8 7 --  CREATININE 0.61 0.66 --  CALCIUM 7.1* 7.1* --  MG -- 1.4* 1.4*  PHOS -- -- --   Liver Function Tests:  Lab 01/12/12 0611  AST 73*  ALT 26  ALKPHOS 71  BILITOT 0.8  PROT 5.7*  ALBUMIN 2.2*   No results found for this basename: LIPASE:2,AMYLASE:2 in the last 168 hours No results found for this basename: AMMONIA:2 in the last 168 hours CBC:  Lab 01/12/12 0611 01/11/12 2000 01/09/12 1646  WBC 14.9* 15.7* --  NEUTROABS -- 14.4* 9.4*  HGB 12.8 13.3 --  HCT 35.9* 37.4 --  MCV 88.2 88.4 --  PLT 195 259 --   Cardiac Enzymes: No results found for this basename: CKTOTAL:3,CKMB:3,CKMBINDEX:3,TROPONINI:3 in the last 168 hours BNP: No results found for this basename: PROBNP:3 in the last 168 hours D-Dimer: No results found for this basename: DDIMER:2  in the last 168 hours CBG:  Lab 01/13/12 0947 01/13/12 0748 01/12/12 2113 01/12/12 1556 01/12/12 1143 01/12/12 0820  GLUCAP 137* 95 163* 241* 247* 231*   Hemoglobin A1C: No results found for this basename: HGBA1C in the last 168 hours Fasting Lipid Panel: No results found for this basename: CHOL,HDL,LDLCALC,TRIG,CHOLHDL,LDLDIRECT in the last 168 hours Thyroid Function Tests: No results found for this basename: TSH,T4TOTAL,FREET4,T3FREE,THYROIDAB in the last 168 hours Coagulation: No results found for this basename: LABPROT:4,INR:4 in the last 168 hours Anemia Panel: No results found for this basename: VITAMINB12,FOLATE,FERRITIN,TIBC,IRON,RETICCTPCT in the last 168 hours Urine Drug Screen: Drugs of Abuse  No results found for this basename: labopia,  cocainscrnur,  labbenz,  amphetmu,  thcu,  labbarb    Alcohol Level: No results found for this basename: ETH:2 in the last 168 hours Urinalysis:  Lab 01/11/12 2225  COLORURINE YELLOW  LABSPEC <1.005*  PHURINE 5.5  GLUCOSEU NEGATIVE  HGBUR NEGATIVE  BILIRUBINUR NEGATIVE  KETONESUR 15*  PROTEINUR NEGATIVE  UROBILINOGEN 0.2  NITRITE NEGATIVE  LEUKOCYTESUR NEGATIVE   Micro Results: Recent Results (from the past 240 hour(s))  CULTURE, BLOOD (ROUTINE X 2)     Status: Normal (Preliminary result)   Collection Time   01/12/12  7:37 AM      Component Value  Range Status Comment   Specimen Description BLOOD RIGHT HAND   Final    Special Requests BOTTLES DRAWN AEROBIC AND ANAEROBIC 6CC   Final    Culture NO GROWTH 1 DAY   Final    Report Status PENDING   Incomplete   CULTURE, BLOOD (ROUTINE X 2)     Status: Normal (Preliminary result)   Collection Time   01/12/12  7:37 AM      Component Value Range Status Comment   Specimen Description BLOOD LEFT HAND   Final    Special Requests BOTTLES DRAWN AEROBIC AND ANAEROBIC 6CC   Final    Culture NO GROWTH 1 DAY   Final    Report Status PENDING   Incomplete   CLOSTRIDIUM DIFFICILE BY PCR      Status: Abnormal   Collection Time   01/12/12  8:39 AM      Component Value Range Status Comment   C difficile by pcr POSITIVE (*) NEGATIVE  Final   STOOL CULTURE     Status: Normal (Preliminary result)   Collection Time   01/12/12  8:39 AM      Component Value Range Status Comment   Specimen Description STOOL   Final    Special Requests NONE   Final    Culture Culture reincubated for better growth   Final    Report Status PENDING   Incomplete    Studies/Results: Ct Abdomen Pelvis W Contrast  01/11/2012  *RADIOLOGY REPORT*  Clinical Data: Ulcerative colitis.  Abdominal pain and diarrhea. Anorexia.  CT ABDOMEN AND PELVIS WITH CONTRAST  Technique:  Multidetector CT imaging of the abdomen and pelvis was performed following the standard protocol during bolus administration of intravenous contrast.  Contrast: 142m OMNIPAQUE IOHEXOL 300 MG/ML  SOLN  Comparison: None.  Findings: Moderate to severe diffuse colitis is seen with pericolonic inflammatory changes, consistent with history of ulcerative colitis.  There is no evidence of pneumatosis or portal venous gas.  No evidence of abscess.  A small amount of free fluid is seen in the pelvis, paracolic gutters, and right perihepatic space.  No evidence of dilated small bowel loops.  Surgical clips is seen from prior cholecystectomy.  The abdominal parenchymal organs are normal in appearance.  No evidence of hydronephrosis.  No soft tissue masses or lymphadenopathy identified.  Previous hysterectomy noted.  IMPRESSION:  1.  Moderate to severe diffuse colitis, consistent with known history of ulcerative colitis. 2.  Mild ascites.  No evidence of abscess, pneumatosis, or pneumoperitoneum.  Original Report Authenticated By: JMarlaine Hind M.D.   Scheduled Meds:    . desvenlafaxine  50 mg Oral Daily  . Flora-Q  2 capsule Oral TID  . insulin aspart  0-20 Units Subcutaneous TID WC  . insulin aspart  0-5 Units Subcutaneous QHS  . insulin glargine  10 Units  Subcutaneous Daily  . metoprolol succinate  50 mg Oral Daily  . metronidazole  500 mg Intravenous Q8H  . sodium chloride  3 mL Intravenous Q12H  . sodium chloride      . sodium chloride      . traZODone  50 mg Oral QHS  . vancomycin  250 mg Oral Q6H  . DISCONTD: diphenhydrAMINE  50 mg Oral QHS  . DISCONTD: insulin aspart  0-15 Units Subcutaneous TID WC  . DISCONTD: insulin aspart  0-5 Units Subcutaneous QHS   Continuous Infusions:    . 0.9 % NaCl with KCl 20 mEq / L 125 mL/hr at 01/13/12  1511   PRN Meds:.acetaminophen, acetaminophen, ALPRAZolam, HYDROmorphone, ondansetron (ZOFRAN) IV, ondansetron, DISCONTD: ondansetron (ZOFRAN) IV, DISCONTD: ondansetron Assessment/Plan: Principal Problem:  *C. difficile colitis Active Problems:  DM (diabetes mellitus), type 2, uncontrolled  HYPERTENSION  Hyponatremia  Hypokalemia  Hypomagnesemia  GERD  ULCERATIVE COLITIS  Insomnia  Dehydration  Vancomycin by mouth ordered by Dr. Melony Overly. Change Benadryl to trazodone. Patient reports that she did not like Ambien in the past. Hyponatremia, hypokalemia, diabetes improved.   LOS: 2 days   [name] L 01/13/2012, 4:07 PM

## 2012-01-13 NOTE — Progress Notes (Signed)
UR Chart Review Completed  

## 2012-01-13 NOTE — Telephone Encounter (Signed)
Noted; Will see patient later today.

## 2012-01-14 LAB — CBC
HCT: 38.4 % (ref 36.0–46.0)
Hemoglobin: 13.1 g/dL (ref 12.0–15.0)
MCH: 31.3 pg (ref 26.0–34.0)
MCV: 88.5 fL (ref 78.0–100.0)
Platelets: 22 10*3/uL — CL (ref 150–400)
Platelets: 28 10*3/uL — CL (ref 150–400)
RBC: 4.34 MIL/uL (ref 3.87–5.11)
RBC: 4.18 MIL/uL (ref 3.87–5.11)
WBC: 7.7 10*3/uL (ref 4.0–10.5)
WBC: 8.4 10*3/uL (ref 4.0–10.5)

## 2012-01-14 LAB — DIFFERENTIAL
Eosinophils Relative: 1 % (ref 0–5)
Lymphocytes Relative: 20 % (ref 12–46)
Lymphs Abs: 1.5 10*3/uL (ref 0.7–4.0)
Monocytes Relative: 10 % (ref 3–12)

## 2012-01-14 LAB — GLUCOSE, CAPILLARY: Glucose-Capillary: 129 mg/dL — ABNORMAL HIGH (ref 70–99)

## 2012-01-14 MED ORDER — MORPHINE SULFATE 2 MG/ML IJ SOLN
2.0000 mg | INTRAMUSCULAR | Status: DC | PRN
  Administered 2012-01-14 – 2012-01-15 (×3): 2 mg via INTRAVENOUS
  Filled 2012-01-14 (×4): qty 1

## 2012-01-14 MED ORDER — ALPRAZOLAM 0.25 MG PO TABS
0.2500 mg | ORAL_TABLET | Freq: Every day | ORAL | Status: DC
  Administered 2012-01-14 – 2012-01-18 (×5): 0.25 mg via ORAL
  Filled 2012-01-14 (×6): qty 1

## 2012-01-14 MED ORDER — INSULIN GLARGINE 100 UNIT/ML ~~LOC~~ SOLN
15.0000 [IU] | Freq: Every day | SUBCUTANEOUS | Status: DC
Start: 2012-01-15 — End: 2012-01-19
  Administered 2012-01-17 – 2012-01-19 (×3): 15 [IU] via SUBCUTANEOUS

## 2012-01-14 NOTE — Progress Notes (Addendum)
CRITICAL VALUE ALERT  Critical value received:  PLTS 22  Date of notification:  01/14/2012  Time of notification:  2010  Critical value read back:yes  Nurse who received alert:  Havery Moros RN  MD notified (1st page):  [place] Pink  Time of first page:  2010  MD notified (2nd page):  Time of second page:  Responding MD: [place] Pink  Time MD responded: 2015

## 2012-01-14 NOTE — Progress Notes (Addendum)
Discussed with Dr. Melony Overly.   Subjective: Multiple complaints. Did not sleep last night despite trazodone. Has been taking cat naps throughout the day today however. Complains of drenching sweats after Dilaudid and requesting medication change. Does not want clear liquids and is requesting diet be advanced. Complaining of bloating. Has had about 3 loose stools today. Nausea appears improved, but not eating much.  Objective: Vital signs in last 24 hours: Filed Vitals:   01/13/12 0516 01/13/12 1523 01/13/12 2153 01/14/12 0448  BP: 116/75 160/99 156/91 119/78  Pulse: 96 96 115 108  Temp: 98.7 F (37.1 C) 98.3 F (36.8 C) 100.2 F (37.9 C) 97.4 F (36.3 C)  TempSrc: Oral Oral Oral Oral  Resp: 18 24 18 18   Height:      Weight:      SpO2: 92% 97% 96% 92%   Weight change:   Intake/Output Summary (Last 24 hours) at 01/14/12 1326 Last data filed at 01/14/12 0600  Gross per 24 hour  Intake   3777 ml  Output      0 ml  Net   3777 ml   Physical Exam: Gen.: Appears comfortable. Affect flat. Lungs clear to auscultation bilaterally without wheeze rhonchi or rales Cardiovascular regular rate rhythm without murmurs gallops rubs Abdomen obese, soft, mild tenderness Extremities sequential compression devices in place. No clubbing cyanosis or edema.  Lab Results: Basic Metabolic Panel:  Lab 45/03/88 0547 01/12/12 0611 01/09/12 1646  NA 132* 125* --  K 4.2 3.5 --  CL 98 90* --  CO2 29 25 --  GLUCOSE 127* 241* --  BUN 8 7 --  CREATININE 0.61 0.66 --  CALCIUM 7.1* 7.1* --  MG -- 1.4* 1.4*  PHOS -- -- --   Liver Function Tests:  Lab 01/12/12 0611  AST 73*  ALT 26  ALKPHOS 71  BILITOT 0.8  PROT 5.7*  ALBUMIN 2.2*   No results found for this basename: LIPASE:2,AMYLASE:2 in the last 168 hours No results found for this basename: AMMONIA:2 in the last 168 hours CBC:  Lab 01/12/12 0611 01/11/12 2000 01/09/12 1646  WBC 14.9* 15.7* --  NEUTROABS -- 14.4* 9.4*  HGB 12.8 13.3 --   HCT 35.9* 37.4 --  MCV 88.2 88.4 --  PLT 195 259 --   Cardiac Enzymes: No results found for this basename: CKTOTAL:3,CKMB:3,CKMBINDEX:3,TROPONINI:3 in the last 168 hours BNP: No results found for this basename: PROBNP:3 in the last 168 hours D-Dimer: No results found for this basename: DDIMER:2 in the last 168 hours CBG:  Lab 01/14/12 1148 01/14/12 0820 01/13/12 2026 01/13/12 1653 01/13/12 1135 01/13/12 0947  GLUCAP 197* 190* 141* 261* 140* 137*   Hemoglobin A1C: No results found for this basename: HGBA1C in the last 168 hours Fasting Lipid Panel: No results found for this basename: CHOL,HDL,LDLCALC,TRIG,CHOLHDL,LDLDIRECT in the last 168 hours Thyroid Function Tests: No results found for this basename: TSH,T4TOTAL,FREET4,T3FREE,THYROIDAB in the last 168 hours Coagulation: No results found for this basename: LABPROT:4,INR:4 in the last 168 hours Anemia Panel: No results found for this basename: VITAMINB12,FOLATE,FERRITIN,TIBC,IRON,RETICCTPCT in the last 168 hours Urine Drug Screen: Drugs of Abuse  No results found for this basename: labopia,  cocainscrnur,  labbenz,  amphetmu,  thcu,  labbarb    Alcohol Level: No results found for this basename: ETH:2 in the last 168 hours Urinalysis:  Lab 01/11/12 2225  COLORURINE YELLOW  LABSPEC <1.005*  PHURINE 5.5  GLUCOSEU NEGATIVE  HGBUR NEGATIVE  BILIRUBINUR NEGATIVE  KETONESUR [place]  0.2  NITRITE NEGATIVE  LEUKOCYTESUR NEGATIVE   Micro Results: Recent Results (from the past 240 hour(s))  CULTURE, BLOOD (ROUTINE X 2)     Status: Normal (Preliminary result)   Collection Time   01/12/12  7:37 AM      Component Value Range Status Comment   Specimen Description BLOOD RIGHT HAND   Final    Special Requests BOTTLES DRAWN AEROBIC AND ANAEROBIC 6CC   Final    Culture NO GROWTH 2 DAYS   Final    Report Status PENDING   Incomplete   CULTURE, BLOOD (ROUTINE X 2)     Status: Normal (Preliminary result)    Collection Time   01/12/12  7:37 AM      Component Value Range Status Comment   Specimen Description BLOOD LEFT HAND   Final    Special Requests BOTTLES DRAWN AEROBIC AND ANAEROBIC 6CC   Final    Culture NO GROWTH 2 DAYS   Final    Report Status PENDING   Incomplete   CLOSTRIDIUM DIFFICILE BY PCR     Status: Abnormal   Collection Time   01/12/12  8:39 AM      Component Value Range Status Comment   C difficile by pcr POSITIVE (*) NEGATIVE  Final   STOOL CULTURE     Status: Normal (Preliminary result)   Collection Time   01/12/12  8:39 AM      Component Value Range Status Comment   Specimen Description STOOL   Final    Special Requests NONE   Final    Culture NO SUSPICIOUS COLONIES, CONTINUING TO HOLD   Final    Report Status PENDING   Incomplete    Studies/Results: No results found. Scheduled Meds:    . ALPRAZolam  0.25 mg Oral QHS  . desvenlafaxine  50 mg Oral Daily  . Flora-Q  2 capsule Oral TID  . insulin aspart  0-20 Units Subcutaneous TID WC  . insulin aspart  0-5 Units Subcutaneous QHS  . insulin glargine  10 Units Subcutaneous Daily  . magnesium sulfate 1 - 4 g bolus IVPB  1 g Intravenous Daily  . metoprolol succinate  50 mg Oral Daily  . metronidazole  500 mg Intravenous Q8H  . sodium chloride  3 mL Intravenous Q12H  . traZODone  50 mg Oral QHS  . vancomycin  250 mg Oral Q6H  . DISCONTD: diphenhydrAMINE  50 mg Oral QHS   Continuous Infusions:    . 0.9 % NaCl with KCl 20 mEq / L 125 mL/hr at 01/14/12 0349   PRN Meds:.acetaminophen, acetaminophen, morphine injection, ondansetron (ZOFRAN) IV, ondansetron, DISCONTD: ALPRAZolam, DISCONTD: HYDROmorphone, DISCONTD: ondansetron (ZOFRAN) IV, DISCONTD: ondansetron Assessment/Plan: Principal Problem:  *C. difficile colitis Active Problems:  DM (diabetes mellitus), type 2, uncontrolled  HYPERTENSION  Hyponatremia  GERD  ULCERATIVE COLITIS  Insomnia  Dr. Melony Overly plans on probable sigmoidoscopy tomorrow to evaluate for  pseudomembranous colitis versus ulcerative colitis flare versus both. Patient does not feel much better despite adequate therapy for both. Her fevers have resolved however. She appears more comfortable on exam and has less nausea. Advance to full liquid diet per her request. Change Xanax to nightly. I encouraged her to get out and out of bed. Continue sequential compression devices. Change pain medication to morphine per her request.   LOS: 3 days   Brianna Esson L 01/14/2012, 1:26 PM

## 2012-01-14 NOTE — Progress Notes (Signed)
Subjective; Patient says she had 6 loose stools in the last 24 hours. She continues to complain of abdominal soreness. She says she is no better or worse. She is keeping clear liquids down. She was unable to rest last night and therefore does not feel well. Having no difficulty with oral vancomycin. Objective; BP 119/78  Pulse 108  Temp(Src) 97.4 F (36.3 C) (Oral)  Resp 18  Ht 5' 2"  (1.575 m)  Wt 170 lb (77.111 kg)  BMI 31.09 kg/m2  SpO2 92% Abdomen is protuberant. Bowel sounds are present. Generalized tenderness which is mild but moderate in midepigastric region. Stool cultures no pathogens. Assessment; Acute colitis presumed to be due to C. Difficile. Patient on IV metronidazole and oral vancomycin without symptomatic improvement. If she does not show signs and symptoms of improvement in the next day would consider diagnostic flexible sigmoidoscopy to make sure ulcerative colitis is not the primary source of acute illness. Patient had repeat CBC and metabolic 7 in a.m. Will ask lab to do differential as well.

## 2012-01-14 NOTE — Clinical Documentation Improvement (Signed)
MALNUTRITION DOCUMENTATION CLARIFICATION  THIS DOCUMENT IS NOT A PERMANENT PART OF THE MEDICAL RECORD  TO RESPOND TO THE THIS QUERY, FOLLOW THE INSTRUCTIONS BELOW:  1. If needed, update documentation for the patient's encounter via the notes activity.  2. Access this query again and click edit on the In [employer].  3. After updating, or not, click F2 to complete all highlighted (required) fields concerning your review. Select "additional documentation in the medical record" OR "no additional documentation provided".  4. Click Sign note button.  5. The deficiency will fall out of your In Basket *Please let us know if you are not able to complete this workflow by phone or e-mail (listed below).  Please update your documentation within the medical record to reflect your response to this query.                                                                                        01/14/12   Dear Dr. Conley [facility],  In a better effort to capture your patient's severity of illness, reflect appropriate length of stay and utilization of resources, a review of the patient medical record has revealed the following indicators.  Please clarify and document in a progress note and/or discharge summary the clinical condition associated with the following supporting information. Please exercise your independent judgment.  The fact that a query is asked, does not imply that any particular answer is desired or expected.  Possible Clinical Conditions?  Mild Malnutrition  Moderate Malnutrition Severe Malnutrition   Protein Calorie Malnutrition Severe Protein Calorie Malnutrition Other Condition________________ Cannot clinically determine   Supporting Information:  Clinical Information:  Risk Factors: C Diff colitis Diarrhea  Signs & Symptoms: -Ht: 5'2" Wt: 170lbs -BMI: 31.09 -Weight Lost 20lbs over 3 weeks  -Diagnostics:  -Albumin level: 2.2 -Total Protein: 5.7 -Calcium level:  7.1  Treatments: IV Vancomycin 270m q6h IV Flaygl [id number] Full liquid diet  You may use possible, probable, or suspect with inpatient documentation. possible, probable, suspected diagnoses MUST be documented at the time of discharge  Reviewed: No response from MD Thank You,  DeEstella HuskN, MSN Clinical Documentation Specialist: Office# [phone]

## 2012-01-15 ENCOUNTER — Encounter (HOSPITAL_COMMUNITY): Payer: Self-pay | Admitting: Internal Medicine

## 2012-01-15 DIAGNOSIS — D696 Thrombocytopenia, unspecified: Secondary | ICD-10-CM | POA: Diagnosis present

## 2012-01-15 LAB — BASIC METABOLIC PANEL
Chloride: 97 mEq/L (ref 96–112)
GFR calc Af Amer: >90 mL/min (ref >90)
Potassium: 4.3 mEq/L (ref 3.5–5.1)

## 2012-01-15 LAB — COMPREHENSIVE METABOLIC PANEL
ALT: 124 U/L — ABNORMAL HIGH (ref 0–35)
AST: 212 U/L — ABNORMAL HIGH (ref 0–37)
CO2: 25 mEq/L (ref 19–32)
Calcium: 7.1 mg/dL — ABNORMAL LOW (ref 8.4–10.5)
Chloride: 94 mEq/L — ABNORMAL LOW (ref 96–112)
GFR calc non Af Amer: >90 mL/min (ref >90)
Sodium: 128 mEq/L — ABNORMAL LOW (ref 135–145)

## 2012-01-15 LAB — CBC
HCT: 36.8 % (ref 36.0–46.0)
HCT: 36.6 % (ref 36.0–46.0)
Hemoglobin: 12.9 g/dL (ref 12.0–15.0)
Hemoglobin: 12.9 g/dL (ref 12.0–15.0)
MCH: 31.3 pg (ref 26.0–34.0)
MCV: 88.8 fL (ref 78.0–100.0)
RBC: 4.12 MIL/uL (ref 3.87–5.11)
RDW: 12.8 % (ref 11.5–15.5)
WBC: 9 10*3/uL (ref 4.0–10.5)

## 2012-01-15 LAB — DIC (DISSEMINATED INTRAVASCULAR COAGULATION)PANEL
D-Dimer, Quant: 8.2 ug/mL-FEU — ABNORMAL HIGH (ref 0.00–0.48)
Fibrinogen: 395 mg/dL (ref 204–475)
INR: 1.25 (ref 0.00–1.49)
Prothrombin Time: 16 seconds — ABNORMAL HIGH (ref 11.6–15.2)
aPTT: 45 seconds — ABNORMAL HIGH (ref 24–37)

## 2012-01-15 LAB — GLUCOSE, CAPILLARY

## 2012-01-15 MED ORDER — POTASSIUM CHLORIDE CRYS ER 20 MEQ PO TBCR
20.0000 meq | EXTENDED_RELEASE_TABLET | Freq: Every day | ORAL | Status: AC
  Administered 2012-01-15 – 2012-01-17 (×3): 20 meq via ORAL
  Filled 2012-01-15 (×3): qty 1

## 2012-01-15 MED ORDER — FUROSEMIDE 10 MG/ML IJ SOLN
10.0000 mg | Freq: Two times a day (BID) | INTRAMUSCULAR | Status: DC
  Administered 2012-01-15 – 2012-01-18 (×6): 10 mg via INTRAVENOUS
  Filled 2012-01-15 (×6): qty 2

## 2012-01-15 MED ORDER — POTASSIUM CHLORIDE IN NACL 20-0.9 MEQ/L-% IV SOLN
INTRAVENOUS | Status: DC
  Administered 2012-01-15 (×2): via INTRAVENOUS
  Administered 2012-01-17: 20 mL/h via INTRAVENOUS

## 2012-01-15 NOTE — Progress Notes (Signed)
Subjective: The patient has no complaints of nausea or vomiting and is tolerating full liquids. She has some abdominal bloating. She has small amounts of liquidy stool.  Objective: Vital signs in last 24 hours: Filed Vitals:   01/14/12 2136 01/14/12 2140 01/15/12 0438 01/15/12 1419  BP:  115/73 113/50 121/86  Pulse:  94 96 97  Temp: 98.7 F (37.1 C) 98.7 F (37.1 C) 97.4 F (36.3 C) 97.8 F (36.6 C)  TempSrc: Oral Oral Oral   Resp:  18 18 20   Height:      Weight:      SpO2:  97% 95% 93%    Intake/Output Summary (Last 24 hours) at 01/15/12 1820 Last data filed at 01/15/12 1200  Gross per 24 hour  Intake   2315 ml  Output      0 ml  Net   2315 ml    Weight change:   Physical exam: Lungs: Decreased breath sounds in the bases, otherwise clear. Heart: S1, S2, with a soft systolic murmur. Abdomen: Obese, question fluid associated distention. Mildly tender in the hypogastrium. No rigidity. Extremities: Trace of pedal edema.  Lab Results: Basic Metabolic Panel:  Basename 01/15/12 0609 01/13/12 0547  NA 129* 132*  K 4.3 4.2  CL 97 98  CO2 26 29  GLUCOSE 137* 127*  BUN 9 8  CREATININE 0.63 0.61  CALCIUM 6.7* 7.1*  MG -- --  PHOS -- --   Liver Function Tests: No results found for this basename: AST:2,ALT:2,ALKPHOS:2,BILITOT:2,PROT:2,ALBUMIN:2 in the last 72 hours No results found for this basename: LIPASE:2,AMYLASE:2 in the last 72 hours No results found for this basename: AMMONIA:2 in the last 72 hours CBC:  Basename 01/15/12 0908 01/15/12 0609 01/14/12 1914  WBC 7.6 9.0 --  NEUTROABS -- -- 5.2  HGB 12.9 12.9 --  HCT 36.6 36.8 --  MCV 88.8 88.9 --  PLT 28* 30* --   Cardiac Enzymes: No results found for this basename: CKTOTAL:3,CKMB:3,CKMBINDEX:3,TROPONINI:3 in the last 72 hours BNP: No results found for this basename: PROBNP:3 in the last 72 hours D-Dimer:  [facility] 01/14/12 2143  DDIMER 8.20*   CBG:  Basename 01/15/12 1641 01/15/12 1104 01/15/12  0740 01/14/12 2055 01/14/12 1712 01/14/12 1148  GLUCAP 94 281* 124* 144* 129* 197*   Hemoglobin A1C: No results found for this basename: HGBA1C in the last 72 hours Fasting Lipid Panel: No results found for this basename: CHOL,HDL,LDLCALC,TRIG,CHOLHDL,LDLDIRECT in the last 72 hours Thyroid Function Tests: No results found for this basename: TSH,T4TOTAL,FREET4,T3FREE,THYROIDAB in the last 72 hours Anemia Panel: No results found for this basename: VITAMINB12,FOLATE,FERRITIN,TIBC,IRON,RETICCTPCT in the last 72 hours Coagulation:  Basename 01/14/12 2143  LABPROT 16.0*  INR 1.25   Urine Drug Screen: Drugs of Abuse  No results found for this basename: labopia, cocainscrnur, labbenz, amphetmu, thcu, labbarb    Alcohol Level: No results found for this basename: ETH:2 in the last 72 hours Urinalysis: No results found for this basename: COLORURINE:2,APPERANCEUR:2,LABSPEC:2,PHURINE:2,GLUCOSEU:2,HGBUR:2,BILIRUBINUR:2,KETONESUR:2,PROTEINUR:2,UROBILINOGEN:2,NITRITE:2,LEUKOCYTESUR:2 in the last 72 hours Misc. Labs:   Micro: Recent Results (from the past 240 hour(s))  CULTURE, BLOOD (ROUTINE X 2)     Status: Normal (Preliminary result)   Collection Time   01/12/12  7:37 AM      Component Value Range Status Comment   Specimen Description BLOOD RIGHT HAND   Final    Special Requests BOTTLES DRAWN AEROBIC AND ANAEROBIC 6CC   Final    Culture NO GROWTH 3 DAYS   Final    Report Status PENDING  Incomplete   CULTURE, BLOOD (ROUTINE X 2)     Status: Normal (Preliminary result)   Collection Time   01/12/12  7:37 AM      Component Value Range Status Comment   Specimen Description BLOOD LEFT HAND   Final    Special Requests BOTTLES DRAWN AEROBIC AND ANAEROBIC 6CC   Final    Culture NO GROWTH 3 DAYS   Final    Report Status PENDING   Incomplete   CLOSTRIDIUM DIFFICILE BY PCR     Status: Abnormal   Collection Time   01/12/12  8:39 AM      Component Value Range Status Comment   C difficile by pcr  POSITIVE (*) NEGATIVE  Final   STOOL CULTURE     Status: Normal (Preliminary result)   Collection Time   01/12/12  8:39 AM      Component Value Range Status Comment   Specimen Description STOOL   Final    Special Requests NONE   Final    Culture NO SUSPICIOUS COLONIES, CONTINUING TO HOLD   Final    Report Status PENDING   Incomplete     Studies/Results: No results found.  Medications: I have reviewed the patient's current medications.  Assessment: Principal Problem:  *C. difficile colitis Active Problems:  DM (diabetes mellitus), type 2, uncontrolled  HYPERTENSION  GERD  ULCERATIVE COLITIS  Hyponatremia  Hypokalemia  Hypomagnesemia  Dehydration  Insomnia  Hypocalcemia  Thrombocytopenia    1. C. difficile colitis. Metronidazole was discontinued in favor of oral vancomycin by gastroenterologist, Dr. Laural [name]. Flex sigmoidoscopy pending to assess for active ulcerative colitis.  Severe thrombocytopenia. Her platelet count has plummeted over the past couple of days. It is suspected that metronidazole is the culprit. DIC panel not consistent with DIC. Her clinical scenario does not fit the picture of TTP or necessarily ITP. There are no schistocytes on the blood smear.  Electrolyte abnormalities including hyponatremia, hypokalemia, hypomagnesemia, and hypocalcemia. She has been on IV fluids for at least 72 hours and there appears to be no sign of dehydration. Her potassium has been amply supplemented. Her serum calcium may be low because of hypoalbuminemia or possibly hypomagnesemia.  Mild peripheral edema. This maybe secondary to IV fluids. There may be a dilutional effect of laboratory data.  Type 2 diabetes mellitus. Currently stable and controlled.  Plan:  1. KVO IV fluids. 2. Gentle IV Lasix at 10 mg every 12 hours. 3. Will order CMET in the morning. We'll order a magnesium level as well. We'll continue to monitor the patient's platelet count. Consider hematology  consultation if her platelet count does not improve.    LOS: 4 days   [name] 01/15/2012, 6:20 PM

## 2012-01-15 NOTE — Progress Notes (Signed)
Subjective; Patient feels better today. She is tolerating full liquids and yogurt. Today she is complaining of more pain in the hypogastric region. Assess small amount of brown liquid stool every time she urinates. She denies melena or rectal bleeding. She does not recall she is ever taken metronidazole before. Objective; BP 113/50  Pulse 96  Temp(Src) 97.4 F (36.3 C) (Oral)  Resp 18  Ht 5' 2"  (1.575 m)  Wt 170 lb (77.111 kg)  BMI 31.09 kg/m2  SpO2 95% Patient is alert and does not appear to be in any distress. Cardiac exam with regular rhythm normal S1 and S2. No murmur noted. Lungs are clear to auscultation anteriorly. Abdomen is protuberant. Bowel sounds are hypoactive. Soft abdomen with mild generalized tenderness more so at hypogastric region. Lab data;  WBC 9.0, H&H 12.9 and 36.8. Platelet count is 30 K. Platelet count last evening was 22K and 28 K. Serum sodium 129, potassium 4.3, current 97, CO2 26, glucose 137, BUN 9, creatinine 0.63 and calcium 6.7. DIC screen; INR 1.2, PTT 45, d-dimer 8.2(normal up to 0.48). Fibrinogen 395(normal) Assessment; #1.C. difficile colitis. She is on IV metronidazole and oral vancomycin. Symptomatic improvement. I am still concerned he has active UC in addition to C. difficile colitis. #2. Thrombocytopenia. D-dimer is elevated but her vision is normal. Not believe she has DIC. Elevated d-dimer may be secondary to colitis. She does not appear to be septic. Thrombocytopenia may be secondary to metronidazole. #3. Low serum calcium possibly sec to low albumin; no symptoms of hypocalcemia. Recommendations; Discontinue metronidazole. Repeat lab in a.m. Consider sigmoidoscopy when platelet count normal

## 2012-01-15 NOTE — Progress Notes (Signed)
Called earlier for low platelet levels. The blood test was repeated because her platelet counts were normal just 2 days ago. Repeat levels also confirmed that her platelet counts are low. There is no overt bleeding. Patient admitted with colitis, thought to be secondary to C. difficile. It's possible that she may have sepsis. DIC panel was checked, which was abnormal. She is on no platelet lowering agent.  We'll monitor the counts closely. Since she's not bleeding there is no indication for transfusion.  Dempsey Ahonen 01/15/2012 12:54 AM

## 2012-01-15 NOTE — Progress Notes (Signed)
INITIAL ADULT NUTRITION ASSESSMENT Date: 01/15/2012   Time: 1:23 PM Reason for Assessment: Nutrition  Risk Screen (unplanned wt loss)  ASSESSMENT: Female 66 y.o.  Dx: C. difficile colitis   Past Medical History  Diagnosis Date  . MENOPAUSAL DISORDER   . REDUCTION MAMMOPLASTY, HX OF 1994    bilateral  . ULCERATIVE COLITIS   . HYPERTENSION   . GERD   . DIABETES MELLITUS, TYPE II   . Ulcerative colitis     Scheduled Meds:   . ALPRAZolam  0.25 mg Oral QHS  . desvenlafaxine  50 mg Oral Daily  . Flora-Q  2 capsule Oral TID  . insulin aspart  0-20 Units Subcutaneous TID WC  . insulin aspart  0-5 Units Subcutaneous QHS  . insulin glargine  15 Units Subcutaneous Daily  . magnesium sulfate 1 - 4 g bolus IVPB  1 g Intravenous Daily  . metoprolol succinate  50 mg Oral Daily  . sodium chloride  3 mL Intravenous Q12H  . vancomycin  250 mg Oral Q6H  . DISCONTD: insulin glargine  10 Units Subcutaneous Daily  . DISCONTD: metronidazole  500 mg Intravenous Q8H  . DISCONTD: traZODone  50 mg Oral QHS   Continuous Infusions:   . 0.9 % NaCl with KCl 20 mEq / L 125 mL/hr at 01/15/12 1245   PRN Meds:.acetaminophen, acetaminophen, morphine injection, ondansetron (ZOFRAN) IV, ondansetron  Ht: 5' 2"  (157.5 cm)  Wt: 170 lb (77.111 kg)  Ideal Wt: 50.1 kg  % Ideal Wt: 155%  Usual Wt: 185-188# prior to acute illness % Usual Wt: 92%  Body mass index is 31.09 kg/(m^2). Obesity Class I  Food/Nutrition Related Hx: Pt reports less nausea today and is tol full liquids. Very poor appetite PTA with subsequent unplanned wt loss. Reports that she has had ulcerative colitis dx for 20 yrs.   She is at risk nutritionally r/t poor oral intake 0-25% meals and wt loss 15-20#.   CMP     Component Value Date/Time   NA 129* 01/15/2012 0609   K 4.3 01/15/2012 0609   CL 97 01/15/2012 0609   CO2 26 01/15/2012 0609   GLUCOSE 137* 01/15/2012 0609   BUN 9 01/15/2012 0609   CREATININE 0.63 01/15/2012 0609   CREATININE 0.71 01/09/2012 1646   CALCIUM 6.7* 01/15/2012 0609   PROT 5.7* 01/12/2012 0611   ALBUMIN 2.2* 01/12/2012 0611   AST 73* 01/12/2012 0611   ALT 26 01/12/2012 0611   ALKPHOS 71 01/12/2012 0611   BILITOT 0.8 01/12/2012 0611   GFRNONAA >90 01/15/2012 0609   GFRAA >90 01/15/2012 0609    Intake/Output Summary (Last 24 hours) at 01/15/12 1326 Last data filed at 01/15/12 1200  Gross per 24 hour  Intake   2315 ml  Output      0 ml  Net   2315 ml    Diet Order: Full Liquid  0-25% consumed  Supplements/Tube Feeding:None at this time  IVF:    0.9 % NaCl with KCl 20 mEq / L Last Rate: 125 mL/hr at 01/15/12 1245    Estimated Nutritional Needs:   Kcal:1300-1500 kcal per day Protein:75-85 grams per day Fluid:1 ml per kcal  NUTRITION DIAGNOSIS: -Inadequate oral intake (NI-2.1).  Status: Ongoing  RELATED TO: C. Difficile Colitis  AS EVIDENCE BY: pt hx which includes poor oral intake 0-25% and unplanned wt loss of 15#, 8% < 30 d  MONITORING/EVALUATION(Goals): -Monitor diet advancement and tol, po intake, wt trends and labs -GOC: Pt will  meet > 80% of est nutritional needs and minimize or prevent further wt loss  EDUCATION NEEDS: -Education needs addressed  INTERVENTION: -[place] yogurt with meals until pt can tol more solid foods or oral supplements such as Glucerna and/or ProStat   Dietitian [phone]  DOCUMENTATION CODES Per approved criteria  -Obesity Unspecified    Breella, [place] 01/15/2012, 1:23 PM

## 2012-01-16 ENCOUNTER — Inpatient Hospital Stay (HOSPITAL_COMMUNITY): Payer: Medicare Other

## 2012-01-16 DIAGNOSIS — R188 Other ascites: Secondary | ICD-10-CM | POA: Diagnosis present

## 2012-01-16 LAB — BASIC METABOLIC PANEL
Chloride: 97 mEq/L (ref 96–112)
Creatinine, Ser: 0.54 mg/dL (ref 0.50–1.10)
GFR calc Af Amer: >90 mL/min (ref >90)
Potassium: 3.7 mEq/L (ref 3.5–5.1)

## 2012-01-16 LAB — GLUCOSE, CAPILLARY
Glucose-Capillary: 102 mg/dL — ABNORMAL HIGH (ref 70–99)
Glucose-Capillary: 166 mg/dL — ABNORMAL HIGH (ref 70–99)

## 2012-01-16 LAB — CBC
MCH: 31.6 pg (ref 26.0–34.0)
MCV: 89.6 fL (ref 78.0–100.0)
Platelets: 49 10*3/uL — ABNORMAL LOW (ref 150–400)
RBC: 4.05 MIL/uL (ref 3.87–5.11)

## 2012-01-16 LAB — STOOL CULTURE

## 2012-01-16 MED ORDER — SIMETHICONE 80 MG PO CHEW
160.0000 mg | CHEWABLE_TABLET | Freq: Four times a day (QID) | ORAL | Status: AC
  Administered 2012-01-16 – 2012-01-18 (×7): 160 mg via ORAL
  Filled 2012-01-16 (×4): qty 2
  Filled 2012-01-16: qty 4
  Filled 2012-01-16 (×2): qty 2

## 2012-01-16 MED ORDER — SPIRONOLACTONE 25 MG PO TABS
12.5000 mg | ORAL_TABLET | Freq: Every day | ORAL | Status: DC
  Administered 2012-01-16 – 2012-01-19 (×4): 12.5 mg via ORAL
  Filled 2012-01-16 (×4): qty 1

## 2012-01-16 NOTE — Progress Notes (Signed)
Subjective: The patient has no complaints of nausea, but no vomiting. She still complains of abdominal distention. She has small liquidy stools. No blood.  Objective: Vital signs in last 24 hours: Filed Vitals:   01/15/12 1419 01/15/12 2108 01/16/12 0624 01/16/12 1432  BP: 121/86 140/94 142/95 131/88  Pulse: 97 90 105 107  Temp: 97.8 F (36.6 C) 97.3 F (36.3 C) 97.5 F (36.4 C) 97.8 F (36.6 C)  TempSrc:  Oral Oral   Resp: 20 20 18 18   Height:      Weight:      SpO2: 93% 98% 98% 97%    Intake/Output Summary (Last 24 hours) at 01/16/12 1913 Last data filed at 01/16/12 1700  Gross per 24 hour  Intake    480 ml  Output    502 ml  Net    -22 ml    Weight change:   Physical exam: Lungs: Decreased breath sounds in the bases, otherwise clear. Heart: S1, S2, with a soft systolic murmur. Abdomen: Obese, question fluid associated distention. Mildly tender in the hypogastrium. No rigidity. Extremities: Trace of pedal edema.  Lab Results: Basic Metabolic Panel:  Basename 01/16/12 0551 01/15/12 1852  NA 129* 128*  K 3.7 3.6  CL 97 94*  CO2 26 25  GLUCOSE 96 106*  BUN 6 7  CREATININE 0.54 0.50  CALCIUM 7.0* 7.1*  MG 2.1 --  PHOS -- --   Liver Function Tests:  [facility] 01/15/12 1852  AST 212*  ALT 124*  ALKPHOS 382*  BILITOT 1.2  PROT 5.8*  ALBUMIN 2.1*   No results found for this basename: LIPASE:2,AMYLASE:2 in the last 72 hours No results found for this basename: AMMONIA:2 in the last 72 hours CBC:  Basename 01/16/12 0551 01/15/12 0908 01/14/12 1914  WBC 7.4 7.6 --  NEUTROABS -- -- 5.2  HGB 12.8 12.9 --  HCT 36.3 36.6 --  MCV 89.6 88.8 --  PLT 49* 28* --   Cardiac Enzymes: No results found for this basename: CKTOTAL:3,CKMB:3,CKMBINDEX:3,TROPONINI:3 in the last 72 hours BNP:  Basename 01/16/12 0551  PROBNP 108.7   D-Dimer:  Basename 01/14/12 2143  DDIMER 8.20*   CBG:  Basename 01/16/12 1613 01/16/12 1142 01/16/12 0721 01/15/12 2006 01/15/12  1641 01/15/12 1104  GLUCAP 125* 166* 102* 131* 94 281*   Hemoglobin A1C: No results found for this basename: HGBA1C in the last 72 hours Fasting Lipid Panel: No results found for this basename: CHOL,HDL,LDLCALC,TRIG,CHOLHDL,LDLDIRECT in the last 72 hours Thyroid Function Tests: No results found for this basename: TSH,T4TOTAL,FREET4,T3FREE,THYROIDAB in the last 72 hours Anemia Panel: No results found for this basename: VITAMINB12,FOLATE,FERRITIN,TIBC,IRON,RETICCTPCT in the last 72 hours Coagulation:  Basename 01/14/12 2143  LABPROT 16.0*  INR 1.25   Urine Drug Screen: Drugs of Abuse  No results found for this basename: labopia,  cocainscrnur,  labbenz,  amphetmu,  thcu,  labbarb    Alcohol Level: No results found for this basename: ETH:2 in the last 72 hours Urinalysis: No results found for this basename: COLORURINE:2,APPERANCEUR:2,LABSPEC:2,PHURINE:2,GLUCOSEU:2,HGBUR:2,BILIRUBINUR:2,KETONESUR:2,PROTEINUR:2,UROBILINOGEN:2,NITRITE:2,LEUKOCYTESUR:2 in the last 72 hours Misc. Labs:   Micro: Recent Results (from the past 240 hour(s))  CULTURE, BLOOD (ROUTINE X 2)     Status: Normal (Preliminary result)   Collection Time   01/12/12  7:37 AM      Component Value Range Status Comment   Specimen Description BLOOD RIGHT HAND   Final    Special Requests BOTTLES DRAWN AEROBIC AND ANAEROBIC 6CC   Final    Culture NO GROWTH 4 DAYS  Final    Report Status PENDING   Incomplete   CULTURE, BLOOD (ROUTINE X 2)     Status: Normal (Preliminary result)   Collection Time   01/12/12  7:37 AM      Component Value Range Status Comment   Specimen Description BLOOD LEFT HAND   Final    Special Requests BOTTLES DRAWN AEROBIC AND ANAEROBIC 6CC   Final    Culture NO GROWTH 4 DAYS   Final    Report Status PENDING   Incomplete   CLOSTRIDIUM DIFFICILE BY PCR     Status: Abnormal   Collection Time   01/12/12  8:39 AM      Component Value Range Status Comment   C difficile by pcr POSITIVE (*) NEGATIVE   Final   STOOL CULTURE     Status: Normal   Collection Time   01/12/12  8:39 AM      Component Value Range Status Comment   Specimen Description STOOL   Final    Special Requests NONE   Final    Culture     Final    Value: NO SALMONELLA, SHIGELLA, CAMPYLOBACTER, OR YERSINIA ISOLATED   Report Status 01/16/2012 FINAL   Final     Studies/Results: US Abdomen Complete  01/16/2012  *RADIOLOGY REPORT*  Clinical Data:  Abdominal pain.  C difficile colitis.  COMPLETE ABDOMINAL ULTRASOUND  Comparison:  CT of 01/11/12.  No prior ultrasounds.  Findings:  Gallbladder:  Surgically absent.  Common bile duct: Normal, at 6 mm.  Liver: No focal lesions seen.  IVC: Obscured by bowel gas.  Pancreas:  Obscured by bowel gas.  Spleen:  Normal in size and echogenicity.  Right Kidney:  11.8 cm. No hydronephrosis.  Left Kidney:  11.0 cm. No hydronephrosis.  Abdominal aorta:  Partially obscured distally.  No aneurysm.  Small to moderate volume ascites.  This is  slightly increased since the CT of 01/11/2012.  IMPRESSION:  1.  Ascites, small to moderate in volume.  Increased since 01/11/2012. 2. Cholecystectomy without biliary ductal dilatation. 3.  Portions of exam obscured by bowel gas.  Original Report Authenticated By: Areta Haber, M.D.    Medications: I have reviewed the patient's current medications.  Assessment: Principal Problem:  *C. difficile colitis Active Problems:  DM (diabetes mellitus), type 2, uncontrolled  HYPERTENSION  GERD  ULCERATIVE COLITIS  Hyponatremia  Hypokalemia  Hypomagnesemia  Dehydration  Insomnia  Hypocalcemia  Thrombocytopenia  Elevated LFTs  Ascites    1. C. difficile colitis. Metronidazole was discontinued in favor of oral vancomycin by gastroenterologist, Dr. Laural [name]. Flex sigmoidoscopy pending to assess for active ulcerative colitis.  Severe thrombocytopenia. Her platelet count has decreased significantly overall, but is trending back up for. This is likely secondary  to metronidazole which was discontinued. DIC panel not consistent with DIC.  Electrolyte abnormalities including hyponatremia, hypokalemia, hypomagnesemia, and hypocalcemia. She received IV fluids for nearly 72 hours before they were decreased because of peripheral edema. Her corrected serum calcium is 8.5 given that she has hypoalbuminemia. Her magnesium has been repleted and her level is normal. Her potassium has been repleted. Her sodium is low which may be a reflection of mild volume overload.  Elevated LFTs. An ultrasound was ordered and it revealed ascites. The elevated LFTs may be related to hepatic congestion. Given her hypoalbuminemia,, I wonder if she has the beginning stages of cirrhosis of unknown etiology. This will be discussed with Dr. Laural [name].  Type 2 diabetes mellitus. Currently stable and controlled.  Plan:  1. Continue IV Lasix gently. 2. Add Aldactone. 3. We'll continue to monitor her liver transaminases. 4. Tentative plan for flexible sigmoidoscopy tomorrow. 5. We'll check a viral hepatitis panel in the morning.    LOS: 5 days   Adisa Litt 01/16/2012, 7:13 PM

## 2012-01-16 NOTE — Progress Notes (Addendum)
Subjective: Since I last evaluated the patient  Alert.  She has had 4 stool today. Stools are watery. No blood. Foul odor noted. She is not passing any flatus. She denies having taking any recent antibiotics. C. Difficile positive.  She says she does not have an appetite but will try to eat. Diffuse lower abedominal pain. Platelets up to 49.There has been nausea but no vomiting. She becomes nauseated when she ambulates. She tells me she feels better.  Objective: Vital signs in last 24 hours: Temp:  [97.3 F (36.3 C)-97.8 F (36.6 C)] 97.5 F (36.4 C) (04/18 0624) Pulse Rate:  [90-105] 105  (04/18 0624) Resp:  [18-20] 18  (04/18 0624) BP: (121-142)/(86-95) 142/95 mmHg (04/18 0624) SpO2:  [93 %-98 %] 98 % (04/18 0624) Last BM Date: 01/16/12  Intake/Output from previous day: 04/17 0701 - 04/18 0700 In: 960 [P.O.:960] Out: 502 [Urine:500; Stool:2] Intake/Output this shift:    General appearance: no distressAlert and oriented. Skin warm and dry. Oral mucosa is moist.   . Sclera anicteric, conjunctivae is pink. Thyroid not enlarged. No cervical lymphadenopathy. Lungs clear. Heart regular rate and rhythm.  Abdomen is soft. Bowel sounds are positive. No hepatomegaly. No abdominal masses felt.Lower abdominal tenderness..  No edema to lower extremities      Lab Results:  Basename 01/16/12 0551 01/15/12 0908 01/15/12 0609  WBC 7.4 7.6 9.0  HGB 12.8 12.9 12.9  HCT 36.3 36.6 36.8  PLT 49* 28* 30*   BMET  Basename 01/16/12 0551 01/15/12 1852 01/15/12 0609  NA 129* 128* 129*  K 3.7 3.6 4.3  CL 97 94* 97  CO2 26 25 26   GLUCOSE 96 106* 137*  BUN 6 7 9   CREATININE 0.54 0.50 0.63  CALCIUM 7.0* 7.1* 6.7*   LFT  Basename 01/15/12 1852  PROT 5.8*  ALBUMIN 2.1*  AST 212*  ALT 124*  ALKPHOS 382*  BILITOT 1.2  BILIDIR --  IBILI --   PT/INR  Basename 01/14/12 2143  LABPROT 16.0*  INR 1.25   Hepatitis Panel No results found for this basename: HEPBSAG,HCVAB,HEPAIGM,HEPBIGM in the  last 72 hours C-Diff No results found for this basename: CDIFFTOX:3 in the last 72 hours Fecal Lactopherrin No results found for this basename: FECLLACTOFRN in the last 72 hours  Studies/Results: No results found.  Medications: I have reviewed the patient's current medications.  Assessment/Plan:  Acute colitis presumed to be from C. Difficile.  Flagyl d/c due to drop in platelets. C-difficile covered with Vancomycin.  Plans are for Dr. Laural [name] to do flex. Sigmoid tomorrow.   LOS: 5 days   SETZER,TERRI W 01/16/2012, 8:35 AM  GI attending note; Patient,s abdomen is more distended today and US shows moderate ascites; She has history of fatty liver but no stigmata of liver disease. Ascites may be related to colitis and low albumin. Will order KUB in am for colonic diameter and possible flex. Sig and US guided abdominal tap in am. Mylicon ordered for 8 doses.

## 2012-01-17 ENCOUNTER — Inpatient Hospital Stay (HOSPITAL_COMMUNITY): Payer: Medicare Other

## 2012-01-17 ENCOUNTER — Encounter (HOSPITAL_COMMUNITY)
Admission: EM | Disposition: A | Discharge status: Home / Self Care | Payer: Self-pay | Source: Home / Self Care | Attending: Internal Medicine

## 2012-01-17 ENCOUNTER — Encounter (HOSPITAL_COMMUNITY): Payer: Self-pay | Admitting: *Deleted

## 2012-01-17 HISTORY — PX: FLEXIBLE SIGMOIDOSCOPY: SHX5431

## 2012-01-17 LAB — BASIC METABOLIC PANEL
BUN: 5 mg/dL — ABNORMAL LOW (ref 6–23)
Creatinine, Ser: 0.6 mg/dL (ref 0.50–1.10)
GFR calc Af Amer: >90 mL/min (ref >90)
GFR calc non Af Amer: >90 mL/min (ref >90)

## 2012-01-17 LAB — GLUCOSE, CAPILLARY
Glucose-Capillary: 141 mg/dL — ABNORMAL HIGH (ref 70–99)
Glucose-Capillary: 115 mg/dL — ABNORMAL HIGH (ref 70–99)

## 2012-01-17 LAB — CULTURE, BLOOD (ROUTINE X 2)

## 2012-01-17 LAB — CBC
HCT: 35.7 % — ABNORMAL LOW (ref 36.0–46.0)
MCH: 31.1 pg (ref 26.0–34.0)
MCHC: 34.5 g/dL (ref 30.0–36.0)
MCV: 90.2 fL (ref 78.0–100.0)
RDW: 13.1 % (ref 11.5–15.5)

## 2012-01-17 LAB — HEPATIC FUNCTION PANEL
Bilirubin, Direct: 0.3 mg/dL (ref 0.0–0.3)
Indirect Bilirubin: 0.4 mg/dL (ref 0.3–0.9)
Total Bilirubin: 0.7 mg/dL (ref 0.3–1.2)

## 2012-01-17 SURGERY — SIGMOIDOSCOPY, FLEXIBLE
Anesthesia: Moderate Sedation

## 2012-01-17 MED ORDER — MIDAZOLAM HCL 5 MG/5ML IJ SOLN
INTRAMUSCULAR | Status: AC
  Filled 2012-01-17: qty 5

## 2012-01-17 MED ORDER — MEPERIDINE HCL 50 MG/ML IJ SOLN
INTRAMUSCULAR | Status: AC
  Filled 2012-01-17: qty 1

## 2012-01-17 MED ORDER — MEPERIDINE HCL 25 MG/ML IJ SOLN
INTRAMUSCULAR | Status: DC | PRN
  Administered 2012-01-17 (×2): 25 mg via INTRAVENOUS

## 2012-01-17 MED ORDER — SODIUM CHLORIDE 0.45 % IV SOLN
INTRAVENOUS | Status: DC
  Administered 2012-01-17: 18:00:00 via INTRAVENOUS

## 2012-01-17 MED ORDER — STERILE WATER FOR IRRIGATION IR SOLN
Status: DC | PRN
  Administered 2012-01-17: 18:00:00

## 2012-01-17 MED ORDER — MIDAZOLAM HCL 5 MG/5ML IJ SOLN
INTRAMUSCULAR | Status: DC | PRN
  Administered 2012-01-17: 1 mg via INTRAVENOUS
  Administered 2012-01-17: 2 mg via INTRAVENOUS

## 2012-01-17 MED ORDER — FLEET ENEMA 7-19 GM/118ML RE ENEM
1.0000 | ENEMA | Freq: Once | RECTAL | Status: AC
  Administered 2012-01-17: 1 via RECTAL

## 2012-01-17 NOTE — Op Note (Signed)
FLEXIBLE SIGMOIDOSCOPY  PROCEDURE REPORT  PATIENT:  [name]  MR#:  [id number] Birthdate:  08/29/1946, 66 y.o., female Endoscopist:  Dr. Rogene Houston, MD.  Procedure Date: 01/17/2012  Procedure:   Flexible sigmoidoscopy.  Indications: Patient is 66 year old Caucasian female who has history of chronic UC and currently undergoing therapy for C. difficile colitis still having abdominal pain and diarrhea. She is undergoing diagnostic sigmoidoscopy to determine if she has refractory C. difficile colitis or active UC.  Informed Consent:  The procedure and risks were reviewed with the patient and informed consent was obtained.  Medications:  Demerol 50 mg IV Versed 3 mg IV  Description of procedure:  Procedure performed endoscopy suite. Patient's vital signs and O2 sat were monitored during the procedure and remained stable. Patient was placed in left lateral recumbent position and rectal examination performed. No abnormality noted external addition exam. Pentax videoscope placed in the rectum and advanced  Into sigmoid colon. Scope was advanced to 35 cm and gradually withdrawn with close inspection of mucosa. Scope was not retroflexed in rectum. Findings:  Diffuse changes of colitis involving mucosa of distal sigmoid colon and rectum with loss of vascularity friability and erosions. No pseudomembranes were identified.  Therapeutic/Diagnostic Maneuvers Performed:  Biopsy taken from rectosigmoid junction for routine histology.  Complications:  None    Impression:  Diffuse changes of colitis involving rectum and distal sigmoid colon. Endoscopic appearance favor UC. Foot prints of C. difficile colitis may have resolved since she is being treated with vancomycin.  Recommendations:  Consider short-term treatment with steroid. Will start her on Solu-Medrol 30 mg IV every 12 if cell count analysis on ascitic fluid consistent with transudative ascites.  Andy Moye U  01/17/2012 5:54  PM  CC: Dr. Sherrie Mustache, MD, MD & Dr. Rayne Du ref. provider found

## 2012-01-17 NOTE — Progress Notes (Signed)
Patient feels much better since she had over 1400 cc of ascitic fluid removed by Dr. Thornton Papas. Cell count on this fluid is pending. Platelet count today is 92K. Will proceed with flexible sigmoidoscopy and determine if she also needs to be treated for UC flare.

## 2012-01-17 NOTE — Progress Notes (Signed)
Lisocaine 1%     51m injected                            14370mabdominal fluid removed

## 2012-01-17 NOTE — Progress Notes (Signed)
Subjective: The patient is status post paracentesis. She was seen earlier this morning prior to the paracentesis. She has no complaints of nausea or vomiting. She still complains of abdominal distention. She has small liquidy green stools. No blood. She wants to note if she can't eat solid foods.  Objective: Vital signs in last 24 hours: Filed Vitals:   01/16/12 1432 01/16/12 2103 01/17/12 0626 01/17/12 1401  BP: 131/88 132/82 150/91 144/84  Pulse: 107 85 101   Temp: 97.8 F (36.6 C) 98.6 F (37 C) 97.9 F (36.6 C)   TempSrc:  Oral Oral   Resp: 18 20 20 20   Height:      Weight:      SpO2: 97% 96% 94% 94%    Intake/Output Summary (Last 24 hours) at 01/17/12 1553 Last data filed at 01/17/12 1230  Gross per 24 hour  Intake    600 ml  Output    950 ml  Net   -350 ml    Weight change:   Physical exam: Lungs: Decreased breath sounds in the bases, otherwise clear. Heart: S1, S2, with a soft systolic murmur. Abdomen: Obese, question fluid associated distention. Mildly tender in the hypogastrium. No rigidity. Extremities: Trace of pedal edema.  Lab Results: Basic Metabolic Panel:  Basename 01/17/12 0544 01/16/12 0551  NA 131* 129*  K 3.4* 3.7  CL 98 97  CO2 26 26  GLUCOSE 95 96  BUN 5* 6  CREATININE 0.60 0.54  CALCIUM 7.7* 7.0*  MG -- 2.1  PHOS -- --   Liver Function Tests:  [facility] 01/17/12 0544 01/15/12 1852  AST 73* 212*  ALT 58* 124*  ALKPHOS 289* 382*  BILITOT 0.7 1.2  PROT 4.8* 5.8*  ALBUMIN 1.7* 2.1*   No results found for this basename: LIPASE:2,AMYLASE:2 in the last 72 hours No results found for this basename: AMMONIA:2 in the last 72 hours CBC:  Basename 01/17/12 0544 01/16/12 0551 01/14/12 1914  WBC 7.7 7.4 --  NEUTROABS -- -- 5.2  HGB 12.3 12.8 --  HCT 35.7* 36.3 --  MCV 90.2 89.6 --  PLT 92* 49* --   Cardiac Enzymes: No results found for this basename: CKTOTAL:3,CKMB:3,CKMBINDEX:3,TROPONINI:3 in the last 72 hours BNP:  Basename  01/16/12 0551  PROBNP 108.7   D-Dimer:  Basename 01/14/12 2143  DDIMER 8.20*   CBG:  Basename 01/17/12 1119 01/17/12 0739 01/16/12 2050 01/16/12 1613 01/16/12 1142 01/16/12 0721  GLUCAP 141* 107* 111* 125* 166* 102*   Hemoglobin A1C: No results found for this basename: HGBA1C in the last 72 hours Fasting Lipid Panel: No results found for this basename: CHOL,HDL,LDLCALC,TRIG,CHOLHDL,LDLDIRECT in the last 72 hours Thyroid Function Tests: No results found for this basename: TSH,T4TOTAL,FREET4,T3FREE,THYROIDAB in the last 72 hours Anemia Panel: No results found for this basename: VITAMINB12,FOLATE,FERRITIN,TIBC,IRON,RETICCTPCT in the last 72 hours Coagulation:  Basename 01/14/12 2143  LABPROT 16.0*  INR 1.25   Urine Drug Screen: Drugs of Abuse  No results found for this basename: labopia,  cocainscrnur,  labbenz,  amphetmu,  thcu,  labbarb    Alcohol Level: No results found for this basename: ETH:2 in the last 72 hours Urinalysis: No results found for this basename: COLORURINE:2,APPERANCEUR:2,LABSPEC:2,PHURINE:2,GLUCOSEU:2,HGBUR:2,BILIRUBINUR:2,KETONESUR:2,PROTEINUR:2,UROBILINOGEN:2,NITRITE:2,LEUKOCYTESUR:2 in the last 72 hours Misc. Labs:   Micro: Recent Results (from the past 240 hour(s))  CULTURE, BLOOD (ROUTINE X 2)     Status: Normal   Collection Time   01/12/12  7:37 AM      Component Value Range Status Comment   Specimen Description BLOOD  RIGHT HAND   Final    Special Requests BOTTLES DRAWN AEROBIC AND ANAEROBIC 6CC   Final    Culture NO GROWTH 5 DAYS   Final    Report Status 01/17/2012 FINAL   Final   CULTURE, BLOOD (ROUTINE X 2)     Status: Normal   Collection Time   01/12/12  7:37 AM      Component Value Range Status Comment   Specimen Description BLOOD LEFT HAND   Final    Special Requests BOTTLES DRAWN AEROBIC AND ANAEROBIC 6CC   Final    Culture NO GROWTH 5 DAYS   Final    Report Status 01/17/2012 FINAL   Final   CLOSTRIDIUM DIFFICILE BY PCR     Status:  Abnormal   Collection Time   01/12/12  8:39 AM      Component Value Range Status Comment   C difficile by pcr POSITIVE (*) NEGATIVE  Final   STOOL CULTURE     Status: Normal   Collection Time   01/12/12  8:39 AM      Component Value Range Status Comment   Specimen Description STOOL   Final    Special Requests NONE   Final    Culture     Final    Value: NO SALMONELLA, SHIGELLA, CAMPYLOBACTER, OR YERSINIA ISOLATED   Report Status 01/16/2012 FINAL   Final     Studies/Results: US Abdomen Complete  01/16/2012  *RADIOLOGY REPORT*  Clinical Data:  Abdominal pain.  C difficile colitis.  COMPLETE ABDOMINAL ULTRASOUND  Comparison:  CT of 01/11/12.  No prior ultrasounds.  Findings:  Gallbladder:  Surgically absent.  Common bile duct: Normal, at 6 mm.  Liver: No focal lesions seen.  IVC: Obscured by bowel gas.  Pancreas:  Obscured by bowel gas.  Spleen:  Normal in size and echogenicity.  Right Kidney:  11.8 cm. No hydronephrosis.  Left Kidney:  11.0 cm. No hydronephrosis.  Abdominal aorta:  Partially obscured distally.  No aneurysm.  Small to moderate volume ascites.  This is  slightly increased since the CT of 01/11/2012.  IMPRESSION:  1.  Ascites, small to moderate in volume.  Increased since 01/11/2012. 2. Cholecystectomy without biliary ductal dilatation. 3.  Portions of exam obscured by bowel gas.  Original Report Authenticated By: Areta Haber, M.D.   US Paracentesis  01/17/2012  *RADIOLOGY REPORT*  ULTRASOUND GUIDED PARACENTESIS:  Clinical Data:  Ascites, for diagnostic and therapeutic paracentesis  Technique: After explanation of procedure, benefits, and risks, written informed consent was obtained. Time-out protocol was followed. Collection of ascites in left lower quadrant localized by ultrasound. Skin prepped and draped in usual sterile fashion. Skin and soft tissues anesthestized with 6 ml of 1% lidocaine. 5-French Yueh catheter placed into peritoneal cavity. 1450 ml of light orange colored  fluid aspirated by vacuum bottle suction. Procedure tolerated well by patient without immediate complication.  IMPRESSION: Ultrasound-guided paracentesis of 1450 ml of fluid from left lower quadrant site. Fluid sent to laboratory for requested analysis.  Original Report Authenticated By: Burnetta Sabin, M.D.   Dg Abd Portable 1v  01/17/2012  *RADIOLOGY REPORT*  Clinical Data: Abdominal distention.  History of ulcerative colitis.  PORTABLE ABDOMEN - 1 VIEW  Comparison: CT scan of the abdomen and pelvis dated 01/11/2012  Findings: Air is scattered throughout the nondistended colon. Overall density of the abdomen is increased suggesting either fluid- filled loops of bowel or ascites.  There are a few surgical clips in the right  side of the abdomen. No osseous abnormality.  IMPRESSION: No bowel distention.  The density in the abdomen suggests ascites or fluid-filled loops of small bowel.  Original Report Authenticated By: Larey Seat, M.D.    Medications: I have reviewed the patient's current medications.  Assessment: Principal Problem:  *C. difficile colitis Active Problems:  DM (diabetes mellitus), type 2, uncontrolled  HYPERTENSION  GERD  ULCERATIVE COLITIS  Hyponatremia  Hypokalemia  Hypomagnesemia  Dehydration  Insomnia  Hypocalcemia  Thrombocytopenia  Elevated LFTs  Ascites    1. C. difficile colitis in the setting of chronic ulcerated colitis. Metronidazole was discontinued in favor of oral vancomycin by gastroenterologist, Dr. Laural [name]. Flex sigmoidoscopy pending to assess for active ulcerative colitis.  Severe thrombocytopenia. Her platelet count has decreased significantly overall, but is trending back up for. This is likely secondary to metronidazole which was discontinued.  Electrolyte abnormalities including hyponatremia, hypokalemia, hypomagnesemia, and hypocalcemia. She received IV fluids for nearly 72 hours before they were decreased because of peripheral edema. Her  corrected serum calcium is 8.5 with hypoalbuminemia. Her magnesium has been repleted and her level is normal. Her potassium has been repleted. Her sodium is low which may be a reflection of mild volume overload.  Elevated LFTs and ascites. Status post paracentesis as discussed with Dr. Laural [name]. Approximately 1500 cc of fluid was pulled off. Fluid studies ordered and are pending. Liver transaminases are trending downward. Viral hepatitis panel is pending. She is on gentle IV Lasix and Aldactone which was started.  Type 2 diabetes mellitus. Currently stable and controlled.  Plan: 1. Await fluid analysis. 2. Further recommendations by Dr. Laural [name] 3. Consider advancing her diet to a soft diet if flexible sigmoidoscopy is not done within this hospitalization.    LOS: 6 days   Virgie Chery 01/17/2012, 3:53 PM

## 2012-01-17 NOTE — Procedures (Signed)
PreOperative Dx: Ascites Postoperative Dx: Ascites Procedure:   US guided paracentesis Radiologist:  Thornton Papas Anesthesia:  6 ml of 1% lidocaine Specimen:  1450 ml of light orange colored fluid EBL:   None Complications: None

## 2012-01-17 NOTE — Progress Notes (Signed)
Received call from Radiologist stating they pulled 1.5L of fluid off patient. Will continue to monitor.

## 2012-01-18 LAB — LACTATE DEHYDROGENASE: LDH: 387 U/L — ABNORMAL HIGH (ref 94–250)

## 2012-01-18 LAB — GLUCOSE, CAPILLARY: Glucose-Capillary: 151 mg/dL — ABNORMAL HIGH (ref 70–99)

## 2012-01-18 LAB — BODY FLUID CELL COUNT WITH DIFFERENTIAL: Lymphs, Fluid: 2 %

## 2012-01-18 LAB — PROTEIN, BODY FLUID: Total protein, fluid: 2.1 g/dL

## 2012-01-18 LAB — LACTATE DEHYDROGENASE, PLEURAL OR PERITONEAL FLUID

## 2012-01-18 MED ORDER — PREDNISONE 20 MG PO TABS
30.0000 mg | ORAL_TABLET | Freq: Every day | ORAL | Status: DC
  Administered 2012-01-19: 30 mg via ORAL
  Filled 2012-01-18: qty 1

## 2012-01-18 MED ORDER — METHYLPREDNISOLONE SODIUM SUCC 40 MG IJ SOLR
30.0000 mg | Freq: Two times a day (BID) | INTRAMUSCULAR | Status: AC
  Administered 2012-01-18 (×2): 30 mg via INTRAVENOUS
  Filled 2012-01-18: qty 1

## 2012-01-18 NOTE — Progress Notes (Signed)
Subjective; Patient states she feels much better. She ate more than half of her breakfast. She denies nausea or vomiting. She complains of hypogastric pain which he describes to be mild to moderate and nothing like it was a few days ago. She had one BM this morning. Objective; BP 128/75  Pulse 77  Temp(Src) 97.4 F (36.3 C) (Oral)  Resp 18  Ht 5' 2"  (1.575 m)  Wt 170 lb (77.111 kg)  BMI 31.09 kg/m2  SpO2 97% Abdomen is full. It's soft with mild hypogastric tenderness. No organomegaly or masses noted. No LE edema. Lab data; Blood cultures negative Ascitic fluid analysis; Fluid clear and yellow Protein 2.1 g/dL LDH 387 U/L. Cell count WBC 922, neutrophils 8%, lymphocytes 2% monocytes 90% Cultures; preliminary negative. Assessment; #1. Diffuse colitis felt to be due to C. difficile and ulcerative colitis. She had flexible sigmoidoscopy yesterday but biopsy would not be out until next week. Since ascites does not appear to be secondary to infection Will start on Solu-Medrol for UC. #2. Ascites. Possibly secondary to diffuse colitis and hypoalbuminemia. While she has history of fatty liver but imaging studies do not suggest cirrhosis. Cell count done several hours after tap and is possibly inaccurate. If ascites recurs will need another tap. #3. Thrombocytopenia. Improved ostomy secondary to metronidazole. #4. Elevated transaminases. Vital markers pending. Etiology unclear. Recommendations; Solu-Medrol 30 mg IV every 12x2 doses followed by prednisone 30 mg by mouth every morning. CBC with differential and comprehensive chemistry panel in a.m. Will get Dr. Maralyn Sago input regarding management of her DM while she is on prednisone.

## 2012-01-18 NOTE — Progress Notes (Signed)
Subjective: Overall, she feels better. She has less abdominal bloating following the paracentesis yesterday.  Objective: Vital signs in last 24 hours: Filed Vitals:   01/17/12 1745 01/17/12 2215 01/18/12 0505 01/18/12 1342  BP: 137/75 129/79 128/75 131/80  Pulse: 90 83 77 78  Temp:  97.9 F (36.6 C) 97.4 F (36.3 C) 97.5 F (36.4 C)  TempSrc:  Oral Oral Oral  Resp: 25 19 18 19   Height:      Weight:      SpO2: 97% 94% 97% 95%    Intake/Output Summary (Last 24 hours) at 01/18/12 1417 Last data filed at 01/18/12 0522  Gross per 24 hour  Intake    300 ml  Output    852 ml  Net   -552 ml    Weight change:   Physical exam: Lungs: Decreased breath sounds in the bases, otherwise clear. Heart: S1, S2, with a soft systolic murmur. Abdomen: Obese, significantly less fluid wave distention. Mildly tender in the hypogastrium. No rigidity. Extremities: Resolution of pedal edema.  Lab Results: Basic Metabolic Panel:  Basename 01/17/12 0544 01/16/12 0551  NA 131* 129*  K 3.4* 3.7  CL 98 97  CO2 26 26  GLUCOSE 95 96  BUN 5* 6  CREATININE 0.60 0.54  CALCIUM 7.7* 7.0*  MG -- 2.1  PHOS -- --   Liver Function Tests:  [facility] 01/17/12 0544 01/15/12 1852  AST 73* 212*  ALT 58* 124*  ALKPHOS 289* 382*  BILITOT 0.7 1.2  PROT 4.8* 5.8*  ALBUMIN 1.7* 2.1*   No results found for this basename: LIPASE:2,AMYLASE:2 in the last 72 hours No results found for this basename: AMMONIA:2 in the last 72 hours CBC:  Basename 01/17/12 0544 01/16/12 0551  WBC 7.7 7.4  NEUTROABS -- --  HGB 12.3 12.8  HCT 35.7* 36.3  MCV 90.2 89.6  PLT 92* 49*   Cardiac Enzymes: No results found for this basename: CKTOTAL:3,CKMB:3,CKMBINDEX:3,TROPONINI:3 in the last 72 hours BNP:  Basename 01/16/12 0551  PROBNP 108.7   D-Dimer: No results found for this basename: DDIMER:2 in the last 72 hours CBG:  Basename 01/18/12 1147 01/18/12 0808 01/17/12 2033 01/17/12 1903 01/17/12 1119 01/17/12 0739    GLUCAP 119* 91 162* 115* 141* 107*   Hemoglobin A1C: No results found for this basename: HGBA1C in the last 72 hours Fasting Lipid Panel: No results found for this basename: CHOL,HDL,LDLCALC,TRIG,CHOLHDL,LDLDIRECT in the last 72 hours Thyroid Function Tests: No results found for this basename: TSH,T4TOTAL,FREET4,T3FREE,THYROIDAB in the last 72 hours Anemia Panel: No results found for this basename: VITAMINB12,FOLATE,FERRITIN,TIBC,IRON,RETICCTPCT in the last 72 hours Coagulation: No results found for this basename: LABPROT:2,INR:2 in the last 72 hours Urine Drug Screen: Drugs of Abuse  No results found for this basename: labopia,  cocainscrnur,  labbenz,  amphetmu,  thcu,  labbarb    Alcohol Level: No results found for this basename: ETH:2 in the last 72 hours Urinalysis: No results found for this basename: COLORURINE:2,APPERANCEUR:2,LABSPEC:2,PHURINE:2,GLUCOSEU:2,HGBUR:2,BILIRUBINUR:2,KETONESUR:2,PROTEINUR:2,UROBILINOGEN:2,NITRITE:2,LEUKOCYTESUR:2 in the last 72 hours Misc. Labs:   Micro: Recent Results (from the past 240 hour(s))  CULTURE, BLOOD (ROUTINE X 2)     Status: Normal   Collection Time   01/12/12  7:37 AM      Component Value Range Status Comment   Specimen Description BLOOD RIGHT HAND   Final    Special Requests BOTTLES DRAWN AEROBIC AND ANAEROBIC [facility]   Final    Culture NO GROWTH 5 DAYS   Final    Report Status 01/17/2012 FINAL  Final   CULTURE, BLOOD (ROUTINE X 2)     Status: Normal   Collection Time   01/12/12  7:37 AM      Component Value Range Status Comment   Specimen Description BLOOD LEFT HAND   Final    Special Requests BOTTLES DRAWN AEROBIC AND ANAEROBIC 6CC   Final    Culture NO GROWTH 5 DAYS   Final    Report Status 01/17/2012 FINAL   Final   CLOSTRIDIUM DIFFICILE BY PCR     Status: Abnormal   Collection Time   01/12/12  8:39 AM      Component Value Range Status Comment   C difficile by pcr POSITIVE (*) NEGATIVE  Final   STOOL CULTURE     Status:  Normal   Collection Time   01/12/12  8:39 AM      Component Value Range Status Comment   Specimen Description STOOL   Final    Special Requests NONE   Final    Culture     Final    Value: NO SALMONELLA, SHIGELLA, CAMPYLOBACTER, OR YERSINIA ISOLATED   Report Status 01/16/2012 FINAL   Final   BODY FLUID CULTURE     Status: Normal (Preliminary result)   Collection Time   01/17/12  2:36 PM      Component Value Range Status Comment   Specimen Description PARACENTESIS   Final    Special Requests Normal   Final    Gram Stain     Final    Value: NO WBC SEEN     NO ORGANISMS SEEN   Culture PENDING   Incomplete    Report Status PENDING   Incomplete     Studies/Results: US Abdomen Complete  01/16/2012  *RADIOLOGY REPORT*  Clinical Data:  Abdominal pain.  C difficile colitis.  COMPLETE ABDOMINAL ULTRASOUND  Comparison:  CT of 01/11/12.  No prior ultrasounds.  Findings:  Gallbladder:  Surgically absent.  Common bile duct: Normal, at 6 mm.  Liver: No focal lesions seen.  IVC: Obscured by bowel gas.  Pancreas:  Obscured by bowel gas.  Spleen:  Normal in size and echogenicity.  Right Kidney:  11.8 cm. No hydronephrosis.  Left Kidney:  11.0 cm. No hydronephrosis.  Abdominal aorta:  Partially obscured distally.  No aneurysm.  Small to moderate volume ascites.  This is  slightly increased since the CT of 01/11/2012.  IMPRESSION:  1.  Ascites, small to moderate in volume.  Increased since 01/11/2012. 2. Cholecystectomy without biliary ductal dilatation. 3.  Portions of exam obscured by bowel gas.  Original Report Authenticated By: Areta Haber, M.D.   US Paracentesis  01/17/2012  *RADIOLOGY REPORT*  ULTRASOUND GUIDED PARACENTESIS:  Clinical Data:  Ascites, for diagnostic and therapeutic paracentesis  Technique: After explanation of procedure, benefits, and risks, written informed consent was obtained. Time-out protocol was followed. Collection of ascites in left lower quadrant localized by ultrasound. Skin  prepped and draped in usual sterile fashion. Skin and soft tissues anesthestized with 6 ml of 1% lidocaine. 5-French Yueh catheter placed into peritoneal cavity. 1450 ml of light orange colored fluid aspirated by vacuum bottle suction. Procedure tolerated well by patient without immediate complication.  IMPRESSION: Ultrasound-guided paracentesis of 1450 ml of fluid from left lower quadrant site. Fluid sent to laboratory for requested analysis.  Original Report Authenticated By: Burnetta Sabin, M.D.   Dg Abd Portable 1v  01/17/2012  *RADIOLOGY REPORT*  Clinical Data: Abdominal distention.  History of ulcerative colitis.  PORTABLE ABDOMEN - 1 VIEW  Comparison: CT scan of the abdomen and pelvis dated 01/11/2012  Findings: Air is scattered throughout the nondistended colon. Overall density of the abdomen is increased suggesting either fluid- filled loops of bowel or ascites.  There are a few surgical clips in the right side of the abdomen. No osseous abnormality.  IMPRESSION: No bowel distention.  The density in the abdomen suggests ascites or fluid-filled loops of small bowel.  Original Report Authenticated By: Larey Seat, M.D.    Medications: I have reviewed the patient's current medications.  Assessment: Principal Problem:  *C. difficile colitis Active Problems:  DM (diabetes mellitus), type 2, uncontrolled  HYPERTENSION  GERD  ULCERATIVE COLITIS  Hyponatremia  Hypokalemia  Hypomagnesemia  Dehydration  Insomnia  Hypocalcemia  Thrombocytopenia  Elevated LFTs  Ascites    1. C. difficile colitis in the setting of chronic ulcerated colitis. Results of flexible sigmoidoscopy noted by Dr. Dorien Chihuahua. Biopsies taken. Steroids have been started. Metronidazole was discontinued in favor of oral vancomycin.  Severe thrombocytopenia, secondary to metronidazole. Her platelet count improving progressively.   Electrolyte abnormalities including hyponatremia, hypokalemia, hypomagnesemia, and  hypocalcemia. Corrected with supplementation and gentle diuretic therapy for hyponatremia.  Elevated LFTs and ascites. Status post paracentesis as discussed with Dr. Laural [name]. Approximately 1500 cc of fluid was pulled off. Fluid study results are puzzling and likely represents erroneous results. There are 922 WBCs noted on the cell count but none noted on the smear. Apparently, the studies were not done by the lab in a timely manner according to Dr. Laural [name]. Liver transaminases are trending downward. Viral hepatitis panel is pending. She is on gentle IV Lasix and Aldactone which was started.  Type 2 diabetes mellitus. Currently stable and controlled. Anticipate increased capillary blood glucose on steroids.  Plan:  1. Continue current management except discontinue Lasix. Continue Aldactone until discharge. 2. Continue steroids as ordered by Dr. Laural [name]. 3. Possible discharge tomorrow.   LOS: 7 days   Antwone Capozzoli 01/18/2012, 2:17 PM

## 2012-01-18 NOTE — Progress Notes (Signed)
Dr. Albertine Grates called to find out results of patient cell count but had not been resullted yet. Lab tech called me with results greater than 250, cell count is 922 with 8% neutrophils. Myslef as well as operator tried looking up Dr. Gardiner Ramus number but the number on file is incorrect [phone]).

## 2012-01-19 LAB — GLUCOSE, CAPILLARY
Glucose-Capillary: 199 mg/dL — ABNORMAL HIGH (ref 70–99)
Glucose-Capillary: 291 mg/dL — ABNORMAL HIGH (ref 70–99)

## 2012-01-19 LAB — DIFFERENTIAL
Basophils Relative: 0 % (ref 0–1)
Eosinophils Absolute: 0 10*3/uL (ref 0.0–0.7)
Lymphs Abs: 1.3 10*3/uL (ref 0.7–4.0)
Neutrophils Relative %: 67 % (ref 43–77)

## 2012-01-19 LAB — COMPREHENSIVE METABOLIC PANEL
ALT: 39 U/L — ABNORMAL HIGH (ref 0–35)
AST: 40 U/L — ABNORMAL HIGH (ref 0–37)
Albumin: 2 g/dL — ABNORMAL LOW (ref 3.5–5.2)
Alkaline Phosphatase: 220 U/L — ABNORMAL HIGH (ref 39–117)
Potassium: 3.7 mEq/L (ref 3.5–5.1)
Sodium: 133 mEq/L — ABNORMAL LOW (ref 135–145)
Total Protein: 5.7 g/dL — ABNORMAL LOW (ref 6.0–8.3)

## 2012-01-19 LAB — CBC
MCH: 31 pg (ref 26.0–34.0)
MCHC: 34.3 g/dL (ref 30.0–36.0)
Platelets: 151 10*3/uL (ref 150–400)
RBC: 3.71 MIL/uL — ABNORMAL LOW (ref 3.87–5.11)

## 2012-01-19 MED ORDER — VANCOMYCIN 50 MG/ML ORAL SOLUTION: 250.0000 mg | Freq: Four times a day (QID) | ORAL | Status: DC

## 2012-01-19 MED ORDER — FLORA-Q PO CAPS: 1.0000 | ORAL_CAPSULE | Freq: Two times a day (BID) | ORAL | Status: DC

## 2012-01-19 MED ORDER — PREDNISONE 10 MG PO TABS: 30.0000 mg | ORAL_TABLET | Freq: Every day | ORAL | Status: AC

## 2012-01-19 MED ORDER — INSULIN GLARGINE 100 UNIT/ML ~~LOC~~ SOLN: 12.0000 [IU] | Freq: Every day | SUBCUTANEOUS | Status: DC

## 2012-01-19 NOTE — Progress Notes (Signed)
Subjective;  Patient feels much better today. Her appetite is coming back. She ate most of her breakfast. She feels stronger. She has no abdominal pain today. Objective; BP 133/73  Pulse 65  Temp(Src) 97.5 F (36.4 C) (Oral)  Resp 16  Ht 5' 2"  (1.575 m)  Wt 170 lb (77.111 kg)  BMI 31.09 kg/m2  SpO2 95% Lab data; Ascitic fluid cultures negative so far. WBC 4.2 H&H 11.5 and 33.5 platelet count is 151K Segs 67%, lymphs 30%, monocytes 3%. Glucose 214, sodium 133, potassium 3.7, chloride 96, CO2 30, BUN 4, creatinine 0.55, Bilirubin oh 0.6, AP 220, AST 40, ALT 39, albumin 2.0 viral markers pending. Assessment; #1.Acute colitis secondary to C. difficile and flare up of UC. Significant improvement since patient begun on Solu-Medrol and now on prednisone. She remains on vancomycin. Very reassuring to see her albumin is coming back up. #2. Thrombocytopenia resolved. Most likely secondary to Flagyl. #3. Elevated transaminases. Significant improvement; viral markers are pending etiology still not clear. Recommendations; Agree with DC plans. I will contact patient next week with results of biopsy and give her prednisone taper schedule and will possibly start her on uceris. We'll plan to see patient in the office in 3-4 weeks.

## 2012-01-19 NOTE — Discharge Summary (Signed)
Physician Discharge Summary  SAFIYYA STOKES MRN: [id number] DOB/AGE: 1946/06/29 66 y.o.  PCP: Sherrie Mustache, MD, MD   Admit date: 01/11/2012 Discharge date: 01/19/2012  Discharge Diagnoses:  1. C. difficile colitis in the setting of ulcerated colitis exacerbation. Status post sigmoidoscopy by Dr. Laural [name] on April 19th 2013 revealing diffuse changes of colitis involving the rectum and distal sigmoid colon, by Dr. Laural [name]. Biopsies taken; results pending at the time of discharge. 2. Elevated LFTs, likely secondary to ascites and hepatic congestion. The patient's alkaline phosphatase was 220, albumin 2.0, AST 40, ALT 39, and total bilirubin 0.6 at the time of discharge. 3. Ascites with no other sequelae of cirrhosis. The ascites was thought to be secondary to acute colitis. Status post paracentesis on April 19th 2013, yielding 1450 cc of light orange-colored fluid. Culture negative at the time of discharge. 4. Dehydration. 5. Thrombocytopenia, likely secondary to metronidazole. The patient's platelet count fell to a nadir of 22. Following discontinuation of metronidazole, her platelet count normalized to 151 at the time of discharge. 6. Electrolyte abnormalities including hyponatremia, hypokalemia, hypomagnesemia, and hypocalcemia. 7. Type 2 diabetes mellitus. 8. History of GERD. 9. Chronic insomnia.    Medication List  As of 01/19/2012 12:00 PM   STOP taking these medications         esomeprazole 40 MG capsule      hydrocortisone 100 MG/60ML enema         TAKE these medications         ALPRAZolam 0.25 MG tablet   Commonly known as: XANAX   Take 0.25 mg by mouth. prn      aspirin EC 81 MG tablet   Take 81 mg by mouth daily.      desvenlafaxine 50 MG 24 hr tablet   Commonly known as: PRISTIQ   Take 50 mg by mouth daily.      estradiol 1 MG tablet   Commonly known as: ESTRACE   Take 1 mg by mouth daily.      Flora-Q Caps   Take 1 capsule by mouth 2 (two) times  daily.      glipiZIDE 10 MG tablet   Commonly known as: GLUCOTROL   Take 10 mg by mouth daily.      glucose blood test strip   And lancets 1 daily.  250.00      insulin glargine 100 UNIT/ML injection   Commonly known as: LANTUS   Inject 12 Units into the skin daily.      linagliptin 5 MG Tabs tablet   Commonly known as: TRADJENTA   Take 5 mg by mouth daily.      metoprolol succinate 100 MG 24 hr tablet   Commonly known as: TOPROL-XL   1 tablet by mouth every morning, 1/2 tablet every evening      ondansetron 4 MG tablet   Commonly known as: ZOFRAN   Take 1 tablet (4 mg total) by mouth daily as needed for nausea.      predniSONE 10 MG tablet   Commonly known as: DELTASONE   Take 3 tablets (30 mg total) by mouth daily with breakfast. OR AS DIRECTED BY DR. Laural [name].      vancomycin 50 mg/mL oral solution   Commonly known as: VANCOCIN   Take 5 mLs (250 mg total) by mouth every 6 (six) hours. FOR 7 MORE DAYS.            Discharge Condition: Improved and stable.  Disposition: Home.  Consults: Copywriter, advertising, Dr. Gala Romney and Dr. Laural [name].   Significant Diagnostic Studies: US Abdomen Complete  01/16/2012  *RADIOLOGY REPORT*  Clinical Data:  Abdominal pain.  C difficile colitis.  COMPLETE ABDOMINAL ULTRASOUND  Comparison:  CT of 01/11/12.  No prior ultrasounds.  Findings:  Gallbladder:  Surgically absent.  Common bile duct: Normal, at 6 mm.  Liver: No focal lesions seen.  IVC: Obscured by bowel gas.  Pancreas:  Obscured by bowel gas.  Spleen:  Normal in size and echogenicity.  Right Kidney:  11.8 cm. No hydronephrosis.  Left Kidney:  11.0 cm. No hydronephrosis.  Abdominal aorta:  Partially obscured distally.  No aneurysm.  Small to moderate volume ascites.  This is  slightly increased since the CT of 01/11/2012.  IMPRESSION:  1.  Ascites, small to moderate in volume.  Increased since 01/11/2012. 2. Cholecystectomy without biliary ductal dilatation. 3.  Portions of exam obscured  by bowel gas.  Original Report Authenticated By: Areta Haber, M.D.   Ct Abdomen Pelvis W Contrast  01/11/2012  *RADIOLOGY REPORT*  Clinical Data: Ulcerative colitis.  Abdominal pain and diarrhea. Anorexia.  CT ABDOMEN AND PELVIS WITH CONTRAST  Technique:  Multidetector CT imaging of the abdomen and pelvis was performed following the standard protocol during bolus administration of intravenous contrast.  Contrast: 177m OMNIPAQUE IOHEXOL 300 MG/ML  SOLN  Comparison: None.  Findings: Moderate to severe diffuse colitis is seen with pericolonic inflammatory changes, consistent with history of ulcerative colitis.  There is no evidence of pneumatosis or portal venous gas.  No evidence of abscess.  A small amount of free fluid is seen in the pelvis, paracolic gutters, and right perihepatic space.  No evidence of dilated small bowel loops.  Surgical clips is seen from prior cholecystectomy.  The abdominal parenchymal organs are normal in appearance.  No evidence of hydronephrosis.  No soft tissue masses or lymphadenopathy identified.  Previous hysterectomy noted.  IMPRESSION:  1.  Moderate to severe diffuse colitis, consistent with known history of ulcerative colitis. 2.  Mild ascites.  No evidence of abscess, pneumatosis, or pneumoperitoneum.  Original Report Authenticated By: JMarlaine Hind M.D.   UKoreaParacentesis  01/17/2012  *RADIOLOGY REPORT*  ULTRASOUND GUIDED PARACENTESIS:  Clinical Data:  Ascites, for diagnostic and therapeutic paracentesis  Technique: After explanation of procedure, benefits, and risks, written informed consent was obtained. Time-out protocol was followed. Collection of ascites in left lower quadrant localized by ultrasound. Skin prepped and draped in usual sterile fashion. Skin and soft tissues anesthestized with 6 ml of 1% lidocaine. 5-French Yueh catheter placed into peritoneal cavity. 1450 ml of light orange colored fluid aspirated by vacuum bottle suction. Procedure tolerated well by  patient without immediate complication.  IMPRESSION: Ultrasound-guided paracentesis of 1450 ml of fluid from left lower quadrant site. Fluid sent to laboratory for requested analysis.  Original Report Authenticated By: MBurnetta Sabin M.D.   Dg Abd Portable 1v  01/17/2012  *RADIOLOGY REPORT*  Clinical Data: Abdominal distention.  History of ulcerative colitis.  PORTABLE ABDOMEN - 1 VIEW  Comparison: CT scan of the abdomen and pelvis dated 01/11/2012  Findings: Air is scattered throughout the nondistended colon. Overall density of the abdomen is increased suggesting either fluid- filled loops of bowel or ascites.  There are a few surgical clips in the right side of the abdomen. No osseous abnormality.  IMPRESSION: No bowel distention.  The density in the abdomen suggests ascites or fluid-filled loops of small bowel.  Original Report Authenticated By: JJeneen Rinks  H. Zigmund Daniel, M.D.     Microbiology: Recent Results (from the past 240 hour(s))  CULTURE, BLOOD (ROUTINE X 2)     Status: Normal   Collection Time   01/12/12  7:37 AM      Component Value Range Status Comment   Specimen Description BLOOD RIGHT HAND   Final    Special Requests BOTTLES DRAWN AEROBIC AND ANAEROBIC 6CC   Final    Culture NO GROWTH 5 DAYS   Final    Report Status 01/17/2012 FINAL   Final   CULTURE, BLOOD (ROUTINE X 2)     Status: Normal   Collection Time   01/12/12  7:37 AM      Component Value Range Status Comment   Specimen Description BLOOD LEFT HAND   Final    Special Requests BOTTLES DRAWN AEROBIC AND ANAEROBIC 6CC   Final    Culture NO GROWTH 5 DAYS   Final    Report Status 01/17/2012 FINAL   Final   CLOSTRIDIUM DIFFICILE BY PCR     Status: Abnormal   Collection Time   01/12/12  8:39 AM      Component Value Range Status Comment   C difficile by pcr POSITIVE (*) NEGATIVE  Final   STOOL CULTURE     Status: Normal   Collection Time   01/12/12  8:39 AM      Component Value Range Status Comment   Specimen Description STOOL    Final    Special Requests NONE   Final    Culture     Final    Value: NO SALMONELLA, SHIGELLA, CAMPYLOBACTER, OR YERSINIA ISOLATED   Report Status 01/16/2012 FINAL   Final   BODY FLUID CULTURE     Status: Normal (Preliminary result)   Collection Time   01/17/12  2:36 PM      Component Value Range Status Comment   Specimen Description PARACENTESIS   Final    Special Requests Normal   Final    Gram Stain     Final    Value: NO WBC SEEN     NO ORGANISMS SEEN   Culture NO GROWTH   Final    Report Status PENDING   Incomplete      Labs: Results for orders placed during the hospital encounter of 01/11/12 (from the past 48 hour(s))  BODY FLUID CULTURE     Status: Normal (Preliminary result)   Collection Time   01/17/12  2:36 PM      Component Value Range Comment   Specimen Description PARACENTESIS      Special Requests Normal      Gram Stain        Value: NO WBC SEEN     NO ORGANISMS SEEN   Culture NO GROWTH      Report Status PENDING     BODY FLUID CELL COUNT WITH DIFFERENTIAL     Status: Normal   Collection Time   01/17/12  2:36 PM      Component Value Range Comment   Fluid Type-FCT PARACENTESIS      Color, Fluid YELLOW      Appearance, Fluid CLEAR  CLEAR     WBC, Fluid 922  0 - 1000 (cu mm) COUNT MAY BE INACCURATE DUE TO FIBRIN CLUMPS   Neutrophil Count, Fluid 8  0 - 25 (%)    Lymphs, Fluid 2      Monocyte-Macrophage-Serous Fluid 90  50 - 90 (%)    Eos, Fluid 0  LACTATE DEHYDROGENASE, BODY FLUID     Status: Abnormal   Collection Time   01/17/12  2:36 PM      Component Value Range Comment   LD, Fluid 649 (*) 3 - 23 (U/L)    Fluid Type-FLDH PARACENTESIS     PROTEIN, BODY FLUID     Status: Normal   Collection Time   01/17/12  2:36 PM      Component Value Range Comment   Total protein, fluid 2.1   NO NORMAL RANGE ESTABLISHED FOR THIS TEST   Fluid Type-FTP PARACENTESIS     GLUCOSE, CAPILLARY     Status: Abnormal   Collection Time   01/17/12  7:03 PM      Component Value  Range Comment   Glucose-Capillary 115 (*) 70 - 99 (mg/dL)   GLUCOSE, CAPILLARY     Status: Abnormal   Collection Time   01/17/12  8:33 PM      Component Value Range Comment   Glucose-Capillary 162 (*) 70 - 99 (mg/dL)    Comment 1 Notify RN     GLUCOSE, CAPILLARY     Status: Normal   Collection Time   01/18/12  8:08 AM      Component Value Range Comment   Glucose-Capillary 91  70 - 99 (mg/dL)   LACTATE DEHYDROGENASE     Status: Abnormal   Collection Time   01/18/12  8:40 AM      Component Value Range Comment   LDH 387 (*) 94 - 250 (U/L)   GLUCOSE, CAPILLARY     Status: Abnormal   Collection Time   01/18/12 11:47 AM      Component Value Range Comment   Glucose-Capillary 119 (*) 70 - 99 (mg/dL)   GLUCOSE, CAPILLARY     Status: Abnormal   Collection Time   01/18/12  4:39 PM      Component Value Range Comment   Glucose-Capillary 151 (*) 70 - 99 (mg/dL)    Comment 1 Notify RN      Comment 2 Documented in Chart     GLUCOSE, CAPILLARY     Status: Abnormal   Collection Time   01/18/12  9:10 PM      Component Value Range Comment   Glucose-Capillary 117 (*) 70 - 99 (mg/dL)   CBC     Status: Abnormal   Collection Time   01/19/12  7:15 AM      Component Value Range Comment   WBC 4.2  4.0 - 10.5 (K/uL)    RBC 3.71 (*) 3.87 - 5.11 (MIL/uL)    Hemoglobin 11.5 (*) 12.0 - 15.0 (g/dL)    HCT 33.5 (*) 36.0 - 46.0 (%)    MCV 90.3  78.0 - 100.0 (fL)    MCH 31.0  26.0 - 34.0 (pg)    MCHC 34.3  30.0 - 36.0 (g/dL)    RDW 13.3  11.5 - 15.5 (%)    Platelets 151  150 - 400 (K/uL)   DIFFERENTIAL     Status: Normal   Collection Time   01/19/12  7:15 AM      Component Value Range Comment   Neutrophils Relative 67  43 - 77 (%)    Neutro Abs 2.8  1.7 - 7.7 (K/uL)    Lymphocytes Relative 30  12 - 46 (%)    Lymphs Abs 1.3  0.7 - 4.0 (K/uL)    Monocytes Relative 3  3 - 12 (%)    Monocytes Absolute 0.1  0.1 -  1.0 (K/uL)    Eosinophils Relative 0  0 - 5 (%)    Eosinophils Absolute 0.0  0.0 - 0.7 (K/uL)     Basophils Relative 0  0 - 1 (%)    Basophils Absolute 0.0  0.0 - 0.1 (K/uL)   COMPREHENSIVE METABOLIC PANEL     Status: Abnormal   Collection Time   01/19/12  7:15 AM      Component Value Range Comment   Sodium 133 (*) 135 - 145 (mEq/L)    Potassium 3.7  3.5 - 5.1 (mEq/L)    Chloride 96  96 - 112 (mEq/L)    CO2 30  19 - 32 (mEq/L)    Glucose, Bld 215 (*) 70 - 99 (mg/dL)    BUN 4 (*) 6 - 23 (mg/dL)    Creatinine, Ser 0.55  0.50 - 1.10 (mg/dL)    Calcium 8.4  8.4 - 10.5 (mg/dL)    Total Protein 5.7 (*) 6.0 - 8.3 (g/dL)    Albumin 2.0 (*) 3.5 - 5.2 (g/dL)    AST 40 (*) 0 - 37 (U/L)    ALT 39 (*) 0 - 35 (U/L)    Alkaline Phosphatase 220 (*) 39 - 117 (U/L)    Total Bilirubin 0.6  0.3 - 1.2 (mg/dL)    GFR calc non Af Amer >90  >90 (mL/min)    GFR calc Af Amer >90  >90 (mL/min)   GLUCOSE, CAPILLARY     Status: Abnormal   Collection Time   01/19/12  7:56 AM      Component Value Range Comment   Glucose-Capillary 199 (*) 70 - 99 (mg/dL)    Comment 1 Notify RN        HPI : The patient is a 66 year old woman with a history significant for diabetes mellitus and ulcerative colitis, who presented to the emergency department on April 13th 2013 with a chief complaint of generalized weakness, feeling bad, abdominal bloating, and diarrhea. In the emergency department, she was noted to be febrile with a temperature of 102.4 and tachycardic with a heart rate of 112 beats per minute. Her blood pressure was within normal limits. Her lab data were significant for a venous glucose of 256, WBC of 15.7, sodium of 122, potassium of 2.7, CO2 of 33, and normal creatinine. She was admitted for further evaluation and management. Shortly following admission, a CT scan of her abdomen and pelvis which was ordered in the emergency department, revealed findings consistent with ulcerative colitis.   HOSPITAL COURSE: Blood cultures were ordered on admission. The patient was started on IV Cipro and Flagyl empirically.  She was also started on IV Solu-Medrol. C. difficile PCR was ordered. IV fluid hydration was started with normal saline with potassium chloride added. She was treated symptomatically with IV analgesics and IV antiemetics. She developed drenching sweats on IV Dilaudid, and therefore, it was discontinued. Eventually, the C. difficile PCR became positive. Gastroenterology was consulted. Dr. Gala Romney provided the initial consultation in the absence of Dr. Laural [name]. He recommended discontinuing the Cipro and Solu-Medrol and started probiotic therapy with Flora Q, which was done. The patient was also supplemented and repleted with both potassium chloride and magnesium IV and then orally. Her calcium decreased over the course of the hospitalization which was felt to be in part dilutional from the IV fluids and also in part from hypoalbuminemia. It did correct with adjusting for her low albumin level. Several days following the initiation of Flagyl, her platelet count began to  decrease significantly. It reached a nadir of 22. A DIC panel was ordered. It was not consistent with DIC. It was then believed that the thrombocytopenia was secondary to Flagyl. Therefore it was discontinued. Dr. Laural [name], who provided the followup gastroenterology consultation change therapy to oral vancomycin.   The patient improved slowly. She complained of abdominal bloating and discomfort for several days. For this reason, liver transaminases were ordered followed by an ultrasound of her abdomen. Her liver transaminases were elevated. The ultrasound of her abdomen revealed moderate ascites. Subsequently, a diagnostic and therapeutic paracentesis was ordered. It was performed successfully, yielding approximately 1500 cc of fluid. Fluid analysis studies were ordered. Apparently, the ascitic fluid was not sent for several hours following the paracentesis. In review of the results, it was apparent that the results were not accurate and likely erroneous.  On the cell count, there were approximately 1000 WBCs with 80% monocytes, but there were no WBCs reported on the smear and no organisms seen. Over the course of the hospitalization, the culture had no growth. It was unlikely that the patient had peritonitis. A viral hepatitis panel was ordered but the results were pending at the time of discharge. Of note, the patient did receive gentle IV Lasix and spironolactone for ascites but these medications were discontinued upon discharge.  Dr. Laural [name] decided to perform a flexible sigmoidoscopy to assess for flareup of ulcerative colitis. Based on the findings, it was apparent that the patient did have a flareup of ulcerative colitis superimposed on C. difficile colitis. Biopsies were taken but the results are pending at the time of hospital discharge.  The patient continued to have loose stools but progressively fewer over the hospital course. Her diet was slowly advanced which she tolerated well. Her nausea, dyspepsia, abdominal discomfort completely resolved. She received 7 days of therapy for C. difficile colitis in the hospital and was discharged on 7 more days of oral vancomycin. Following the results of the flexible sigmoidoscopy, Dr. Laural [name] started her on steroid therapy. She was given Solu-Medrol for 24 hours and discharged home on 30 mg of prednisone daily. Further adjustments will be made in the outpatient setting by Dr. Laural [name].  For the most part, the patient remained hemodynamically stable and afebrile. All of her electrolytes normalized, but her serum sodium was still slightly low at 133 but overall improved. Her liver transaminases trended downward. All of her cultures were negative at the time of discharge. Her capillary blood glucose was fairly well controlled with sliding-scale NovoLog and Lantus. Tradjeta and glipizide were withheld during the hospitalization. She was advised to restart these medications upon discharge. Lantus was  added for  treatment in the outpatient setting as well, particularly since she was being discharged on prednisone. She received education on insulin administration by the nursing staff at the time of discharge.   Discharge Exam: Blood pressure 133/73, pulse 65, temperature 97.5 F (36.4 C), temperature source Oral, resp. rate 16, height 5' 2"  (1.575 m), weight 77.111 kg (170 lb), SpO2 95.00%.  Lungs: Clear to auscultation bilaterally. Heart: S1, S2, with a soft systolic murmur. Abdomen: Mildly obese, positive bowel sounds, soft, mildly tender in the hypogastrium, significantly less ascitic distention. Extremities: No pedal edema.   Discharge Orders    Future Appointments: Provider: Department: Dept Phone: Center:   02/06/2012 2:30 PM Clearnce Sorrel, MD [facility] [phone] None   02/18/2012 3:00 PM Rogene Houston, MD [name] [phone] None     Future Orders Please Complete By  Expires   Diet - low sodium heart healthy      Diet Carb Modified      Increase activity slowly      Discharge instructions      Comments:   Followup with Dr. Laural [name] and your primary care physician.      Follow-up Information    Follow up with Sherrie Mustache, MD. Schedule an appointment as soon as possible for a visit in 1 week.   Contact information:   [place] [phone]       Follow up with Rogene Houston, MD on 02/18/2012. (AT 3:00 PM)    Contact information:   [address] [place] [phone]          Total discharge time: 40 minutes.   Signed: Apphia Cropley 01/19/2012, 12:00 PM

## 2012-01-20 LAB — HEPATITIS PANEL, ACUTE
HCV Ab: NEGATIVE
Hep A IgM: NEGATIVE

## 2012-01-21 ENCOUNTER — Encounter (HOSPITAL_COMMUNITY): Payer: Self-pay | Admitting: Internal Medicine

## 2012-01-21 LAB — BODY FLUID CULTURE: Culture: NO GROWTH

## 2012-01-28 ENCOUNTER — Telehealth (INDEPENDENT_AMBULATORY_CARE_PROVIDER_SITE_OTHER): Payer: Self-pay | Admitting: *Deleted

## 2012-01-28 DIAGNOSIS — K5190 Ulcerative colitis, unspecified, without complications: Secondary | ICD-10-CM

## 2012-01-28 NOTE — Telephone Encounter (Signed)
Per Dr.Rehman the patient will need to have CBC/D and CRP drawn.

## 2012-01-29 DIAGNOSIS — E1169 Type 2 diabetes mellitus with other specified complication: Secondary | ICD-10-CM | POA: Diagnosis not present

## 2012-01-29 DIAGNOSIS — K5100 Ulcerative (chronic) pancolitis without complications: Secondary | ICD-10-CM | POA: Diagnosis not present

## 2012-01-29 DIAGNOSIS — K5190 Ulcerative colitis, unspecified, without complications: Secondary | ICD-10-CM | POA: Diagnosis not present

## 2012-01-29 DIAGNOSIS — K219 Gastro-esophageal reflux disease without esophagitis: Secondary | ICD-10-CM | POA: Diagnosis not present

## 2012-01-29 DIAGNOSIS — E1165 Type 2 diabetes mellitus with hyperglycemia: Secondary | ICD-10-CM | POA: Diagnosis not present

## 2012-01-29 DIAGNOSIS — I10 Essential (primary) hypertension: Secondary | ICD-10-CM | POA: Diagnosis not present

## 2012-01-29 LAB — CBC WITH DIFFERENTIAL/PLATELET
Eosinophils Absolute: 0 10*3/uL (ref 0.0–0.7)
HCT: 39.2 % (ref 36.0–46.0)
Hemoglobin: 12.9 g/dL (ref 12.0–15.0)
Lymphs Abs: 1.8 10*3/uL (ref 0.7–4.0)
MCH: 31 pg (ref 26.0–34.0)
MCHC: 32.9 g/dL (ref 30.0–36.0)
MCV: 94.2 fL (ref 78.0–100.0)
Monocytes Absolute: 0.3 10*3/uL (ref 0.1–1.0)
Monocytes Relative: 3 % (ref 3–12)
Neutrophils Relative %: 77 % (ref 43–77)
RBC: 4.16 MIL/uL (ref 3.87–5.11)

## 2012-01-29 LAB — C-REACTIVE PROTEIN: CRP: 0.32 mg/dL (ref <0.60)

## 2012-02-04 ENCOUNTER — Telehealth (INDEPENDENT_AMBULATORY_CARE_PROVIDER_SITE_OTHER): Payer: Self-pay | Admitting: *Deleted

## 2012-02-04 NOTE — Telephone Encounter (Signed)
Patient was called and given results of both her CBC/D, CRP ,both were normal. The patient states that she is feeling better just trying to get her strength back.

## 2012-02-06 ENCOUNTER — Ambulatory Visit: Payer: Medicare Other | Admitting: Neurology

## 2012-02-10 ENCOUNTER — Telehealth (INDEPENDENT_AMBULATORY_CARE_PROVIDER_SITE_OTHER): Payer: Self-pay | Admitting: *Deleted

## 2012-02-10 NOTE — Telephone Encounter (Signed)
Patient called and left a message asking if she is to continue Vancomycin she only has 1 more left?  Talked with Dr. Laural [name] and he states that if she is having normal movements she can stop the Vancomycin. Patient called, spoke with her husband and advised him of Dr.Rehman's recommendations. Patient was asleep.

## 2012-02-12 DIAGNOSIS — H2510 Age-related nuclear cataract, unspecified eye: Secondary | ICD-10-CM | POA: Diagnosis not present

## 2012-02-12 DIAGNOSIS — H04129 Dry eye syndrome of unspecified lacrimal gland: Secondary | ICD-10-CM | POA: Diagnosis not present

## 2012-02-12 DIAGNOSIS — E119 Type 2 diabetes mellitus without complications: Secondary | ICD-10-CM | POA: Diagnosis not present

## 2012-02-13 ENCOUNTER — Ambulatory Visit (INDEPENDENT_AMBULATORY_CARE_PROVIDER_SITE_OTHER): Payer: Medicare Other | Admitting: Internal Medicine

## 2012-02-17 ENCOUNTER — Ambulatory Visit (INDEPENDENT_AMBULATORY_CARE_PROVIDER_SITE_OTHER): Payer: Medicare Other | Admitting: Endocrinology

## 2012-02-17 ENCOUNTER — Ambulatory Visit (INDEPENDENT_AMBULATORY_CARE_PROVIDER_SITE_OTHER): Payer: Medicare Other | Admitting: Internal Medicine

## 2012-02-17 ENCOUNTER — Encounter: Payer: Self-pay | Admitting: Endocrinology

## 2012-02-17 ENCOUNTER — Encounter: Payer: Self-pay | Admitting: Internal Medicine

## 2012-02-17 VITALS — BP 132/78 | HR 75 | Temp 97.5°F | Ht 62.0 in | Wt 174.0 lb

## 2012-02-17 DIAGNOSIS — A0472 Enterocolitis due to Clostridium difficile, not specified as recurrent: Secondary | ICD-10-CM | POA: Diagnosis not present

## 2012-02-17 DIAGNOSIS — R7989 Other specified abnormal findings of blood chemistry: Secondary | ICD-10-CM

## 2012-02-17 DIAGNOSIS — K5190 Ulcerative colitis, unspecified, without complications: Secondary | ICD-10-CM | POA: Diagnosis not present

## 2012-02-17 DIAGNOSIS — G4700 Insomnia, unspecified: Secondary | ICD-10-CM

## 2012-02-17 DIAGNOSIS — E1165 Type 2 diabetes mellitus with hyperglycemia: Secondary | ICD-10-CM

## 2012-02-17 MED ORDER — ALPRAZOLAM 0.5 MG PO TABS: 0.5000 mg | ORAL_TABLET | Freq: Every evening | ORAL | Status: DC | PRN

## 2012-02-17 MED ORDER — INSULIN PEN NEEDLE 31G X 6 MM MISC: 1.0000 | Freq: Every day | Status: DC

## 2012-02-17 NOTE — Patient Instructions (Addendum)
You should take "victoza" pen, once a day.  The side-effect is nausea, which goes away with time.  To avoid this side-effect, start with the lowest (0.6) setting.  After a few days, increase to 1.2.  If you still have little or no nausea, increase to the highest (1.8) setting, and continue that setting.  Here is a discount card.  This medication replaces the "tradjenta."  reduce lantus to 5 units daily. Please come back for a follow-up appointment in 2 weeks. we will need to take this complex situation in stages.

## 2012-02-17 NOTE — Progress Notes (Signed)
Subjective:    Patient ID: [name] BEGIN, female    DOB: [birth year], 66 y.o.   MRN: [id number]  HPI  Here for hospital follow up -  Admit date: 01/11/2012 Discharge date: 01/19/2012  Discharge Diagnoses:   1. C. difficile colitis in the setting of ulcerated colitis exacerbation. Status post sigmoidoscopy by Dr. Laural [name] on April 19th 2013 revealing diffuse changes of colitis involving the rectum and distal sigmoid colon, by Dr. Laural [name]. Biopsies taken; results pending at the time of discharge. 2. Elevated LFTs, likely secondary to ascites and hepatic congestion. The patient's alkaline phosphatase was 220, albumin 2.0, AST 40, ALT 39, and total bilirubin 0.6 at the time of discharge. 3. Ascites with no other sequelae of cirrhosis. The ascites was thought to be secondary to acute colitis. Status post paracentesis on April 19th 2013, yielding 1450 cc of light orange-colored fluid. Culture negative at the time of discharge. 4. Dehydration. 5. Thrombocytopenia, likely secondary to metronidazole. The patient's platelet count fell to a nadir of 22. Following discontinuation of metronidazole, her platelet count normalized to 151 at the time of discharge. 6. Electrolyte abnormalities including hyponatremia, hypokalemia, hypomagnesemia, and hypocalcemia. 7. Type 2 diabetes mellitus. 8. History of GERD. 9. Chronic insomnia.  Also reviewed chronic medical issues:  anxiety - started on pristiq by obg after poor tol of other meds since 2011- uses prn xanax   DM2 - follows with endo for same - dx 2010 - no known chronic complications.  she has never been on insulin.  she was unable to tolerate metformin (diarrhea), januvia (headache), byetta (abdominal bruising),  actos (edema), and onglyza.  she now takes glipizide and lantus + victoza. states cbg's are well-controlled until bedtime check. pt says her diet and exercise are good. Notes continued B feet burning sensation  HTN - reports compliance with ongoing  medical treatment and no changes in medication dose or frequency. denies adverse side effects related to current therapy. would prefer to have generics for cost control if possible - chronic LE edema  Dyslipidemia - prev rx wth crestor, lipitor and zocor poorly tolerated due to myalgias -  GERD - reports compliance with ongoing medical treatment and no changes in medication dose or frequency. denies adverse side effects related to current therapy.    Ulcerative colitis -quiet symptoms since 12/2011 hosp DC but acute flare 10/5850 complicated by C diff - follows with GI for same - reports compliance with ongoing medical treatment and no changes in medication dose or frequency. denies adverse side effects related to current therapy.     Past Medical History  Diagnosis Date  . MENOPAUSAL DISORDER   . REDUCTION MAMMOPLASTY, HX OF 1994    bilateral  . ULCERATIVE COLITIS   . HYPERTENSION   . GERD   . DIABETES MELLITUS, TYPE II   . Thrombocytopenia 01/15/2012    presumably d/t metronidazole     Review of Systems Constitutional: Negative for fever or unexpected weight change.  Respiratory: Negative for cough and shortness of breath.   Cardiovascular: Negative for chest pain or palpitations.  No other specific complaints in a complete review of systems (except as listed in HPI above).     Objective:   Physical Exam BP 132/78  Pulse 75  Temp(Src) 97.5 F (36.4 C) (Oral)  Ht 5' 2"  (1.575 m)  Wt 174 lb (78.926 kg)  BMI 31.83 kg/m2  SpO2 97% Wt Readings from Last 3 Encounters:  02/17/12 174 lb (78.926 kg)  02/17/12 174 lb (78.926  kg)  01/11/12 170 lb (77.111 kg)   Constitutional: She is overweight but appears well-developed and well-nourished. No distress.  Eyes: Conjunctivae and EOM are normal. Pupils are equal, round, and reactive to light. No scleral icterus.  Neck: Normal range of motion. Neck supple. No JVD present. No thyromegaly present.  Cardiovascular: Normal rate, regular  rhythm and normal heart sounds.  No murmur heard. No BLE edema. Pulmonary/Chest: Effort normal and breath sounds normal. No respiratory distress. She has no wheezes.  Abdominal: Soft. Bowel sounds are normal. She exhibits no distension. There is no tenderness. no masses Neurological: She is alert and oriented to person, place, and time. No cranial nerve deficit. Coordination normal.  Skin: Skin is warm and dry. No rash noted. No erythema.  Psychiatric: She has a normal mood and affect. Her behavior is normal. Judgment and thought content normal.    Lab Results  Component Value Date   WBC 9.3 01/28/2012   HGB 12.9 01/28/2012   HCT 39.2 01/28/2012   PLT 262 01/28/2012   CHOL 175 01/24/2011   TRIG 538.0* 01/24/2011   HDL 40.30 01/24/2011   LDLDIRECT 69.5 01/24/2011   ALT 39* 01/19/2012   AST 40* 01/19/2012   NA 133* 01/19/2012   K 3.7 01/19/2012   CL 96 01/19/2012   CREATININE 0.55 01/19/2012   BUN 4* 01/19/2012   CO2 30 01/19/2012   TSH 1.47 06/26/2011   INR 1.25 01/14/2012   HGBA1C 9.0* 01/19/2012   MICROALBUR 1.1 05/29/2010       Assessment & Plan:  See problem list. Medications and labs reviewed today.  C diff - symptoms improved - continue oral Vanco as ongoing and follow up GI as planned this week  UC, acute flare - considering alternate tx as Asacol ineffective - defer to GI  electrolyte abnormalities, thrombocytopenia and increase LFTs - resolved t DC - pt reports repeat labs to be done with GI this week  Time spent with pt today 25 minutes, greater than 50% time spent counseling patient on hospitalization for colitis, diabetes and medication review. Also review of hospital records

## 2012-02-17 NOTE — Progress Notes (Signed)
Subjective:    Patient ID: [name], female    DOB: 01-20-1946, 66 y.o.   MRN: [id number]  HPI Pt returns for f/u of type 2 DM (dx'ed 8182; complicated by peripheral sensory neuropathy).  She would like to manage dm without insulin if possible.  she brings a record of her cbg's which i have reviewed today.  It varies from 79-400.  It is in general higher as the day goes on.  She feels better since her recent GI illness.   Past Medical History  Diagnosis Date  . MENOPAUSAL DISORDER   . REDUCTION MAMMOPLASTY, HX OF 1994    bilateral  . ULCERATIVE COLITIS   . HYPERTENSION   . GERD   . DIABETES MELLITUS, TYPE II   . Thrombocytopenia 01/15/2012    presumably d/t metronidazole    Past Surgical History  Procedure Date  . Abdominal hysterectomy 1980    total, with bladder tack  . Breast surgery 1994    Bilateral, due to fibrocystic breast changes  . Rectocele repair 1990  . Carpal tunnel release 1996    Right  . Right knee arthroscopy 01/1999    then again 10/2000  . L4-l5 diskectomy 08/1999  . Cholecystectomy   . Flexible sigmoidoscopy 01/17/2012    Procedure: FLEXIBLE SIGMOIDOSCOPY;  Surgeon: Rogene Houston, MD;  Location: [facility];  Service: Endoscopy;  Laterality: N/A;    History   Social History  . Marital Status: Married    Spouse Name: N/A    Number of Children: N/A  . Years of Education: N/A   Occupational History  . Not on file.   Social History Main Topics  . Smoking status: Never Smoker   . 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 Alcohol Use: Yes     Rare  . Drug Use: No  . Sexually Active: Not on file   Other Topics Concern  . Not on file   Social History Narrative  . No narrative on file    Current Outpatient Prescriptions on File Prior to Visit  Medication Sig Dispense Refill  .  desvenlafaxine (PRISTIQ) 50 MG 24 hr tablet Take 50 mg by mouth daily.        Marland Kitchen estradiol (ESTRACE) 1 MG tablet Take 1 mg by mouth daily.        Marland Kitchen glipiZIDE (GLUCOTROL) 10 MG tablet Take 10 mg by mouth daily.      . insulin glargine (LANTUS) 100 UNIT/ML injection Inject 5 Units into the skin every evening.       . Liraglutide (VICTOZA) 18 MG/3ML SOLN Inject 0.6 mg into the skin every morning.       . metoprolol (TOPROL-XL) 100 MG 24 hr tablet 1 tablet by mouth every morning, 1/2 tablet every evening       . ondansetron (ZOFRAN) 4 MG tablet Take 1 tablet (4 mg total) by mouth daily as needed for nausea.  30 tablet  1  . ALPRAZolam (XANAX) 0.5 MG tablet Take 1 tablet (0.5 mg total) by mouth at bedtime as needed for sleep. prn  30 tablet  3  . Blood Glucose Monitoring Suppl (FREESTYLE LITE) DEVI Checking blood sugars twice a day      . Flora-Q (FLORA-Q) CAPS Take 1 capsule by mouth 2 (two) times daily.  60 capsule  1  . glucose blood (  ONE TOUCH TEST STRIPS) test strip 1 each by Other route 3 (three) times daily. Use as instructed      . NEXIUM 40 MG capsule 40 mg daily. Take 1 by mouth twice a day      . ONE TOUCH LANCETS MISC by Does not apply route 3 (three) times daily.      Marland Kitchen DISCONTD: amitriptyline (ELAVIL) 25 MG tablet Take 1 tablet (25 mg total) by mouth at bedtime. start with 1/2 tab at night for 1 week then increase to 1 whole tab.  30 tablet  3  . DISCONTD: bromocriptine (PARLODEL) 2.5 MG tablet Take 2.5 mg by mouth 2 (two) times daily.        Marland Kitchen DISCONTD: glipiZIDE (GLIPIZIDE XL) 2.5 MG 24 hr tablet Take 1 tablet (2.5 mg total) by mouth daily.  30 tablet  11    Allergies  Allergen Reactions  . Percocet (Oxycodone-Acetaminophen) Itching  . Benzocaine-Menthol   . Colesevelam     REACTION: gi sxs  . Flagyl (Metronidazole Hcl)     Caused thrombocytopenia.  . Nisoldipine   . Oxycodone-Acetaminophen     REACTION: Itch  . Prilosec (Omeprazole)     Family History  Problem Relation Age  of Onset  . Diabetes Mother   . Hypertension Mother   . Heart attack Father     Mid 57's  . Heart disease Father   . Lung disease Father     spot on lung; had lung surgery  . Alcohol abuse Other   . Hypertension Son   . Diabetes Son     BP 132/78  Pulse 75  Temp(Src) 97.5 F (36.4 C) (Oral)  Ht 5' 2"  (1.575 m)  Wt 174 lb (78.926 kg)  BMI 31.83 kg/m2  SpO2 97%  Review of Systems denies hypoglycemia    Objective:   Physical Exam VITAL SIGNS:  See vs page GENERAL: no distress Pulses: dorsalis pedis intact bilat.   Feet: no deformity.  no ulcer on the feet.  feet are of normal color and temp.  no edema Neuro: sensation is intact to touch on the feet, but decreased from normal.  Lab Results  Component Value Date   HGBA1C 9.0* 01/19/2012      Assessment & Plan:  DM.  She may be able to be managed without insulin.

## 2012-02-17 NOTE — Assessment & Plan Note (Signed)
Chronic issue - requests increase in xanax dose for effectiveness Prior trials amitrip, nortrip and gabapentin for neuropathy helped sleep but made numbness in fingers/hands worse - advised follow up with neuro on same as onogoing

## 2012-02-17 NOTE — Patient Instructions (Addendum)
It was good to see you today. We have reviewed your hsopital records including labs and tests today Increase strength xanax for sleep - Your prescription(s) have been submitted to your pharmacy. Please take as directed and contact our office if you believe you are having problem(s) with the medication(s). Other Medications reviewed, no changes at this time. Please schedule followup in 6 months, call sooner if problems.

## 2012-02-18 ENCOUNTER — Encounter (INDEPENDENT_AMBULATORY_CARE_PROVIDER_SITE_OTHER): Payer: Self-pay | Admitting: Internal Medicine

## 2012-02-18 ENCOUNTER — Ambulatory Visit (INDEPENDENT_AMBULATORY_CARE_PROVIDER_SITE_OTHER): Payer: Medicare Other | Admitting: Internal Medicine

## 2012-02-18 ENCOUNTER — Telehealth: Payer: Self-pay | Admitting: *Deleted

## 2012-02-18 VITALS — BP 136/84 | HR 78 | Temp 97.8°F | Resp 18 | Ht 62.0 in | Wt 174.2 lb

## 2012-02-18 DIAGNOSIS — D696 Thrombocytopenia, unspecified: Secondary | ICD-10-CM | POA: Diagnosis not present

## 2012-02-18 DIAGNOSIS — K5190 Ulcerative colitis, unspecified, without complications: Secondary | ICD-10-CM

## 2012-02-18 DIAGNOSIS — A0472 Enterocolitis due to Clostridium difficile, not specified as recurrent: Secondary | ICD-10-CM | POA: Diagnosis not present

## 2012-02-18 DIAGNOSIS — E876 Hypokalemia: Secondary | ICD-10-CM | POA: Diagnosis not present

## 2012-02-18 MED ORDER — FLORA-Q PO CAPS: 1.0000 | ORAL_CAPSULE | Freq: Two times a day (BID) | ORAL | Status: DC

## 2012-02-18 MED ORDER — PREDNISONE 10 MG PO TABS: 10.0000 mg | ORAL_TABLET | Freq: Every day | ORAL | Status: DC

## 2012-02-18 NOTE — Progress Notes (Signed)
Presenting complaint;  Followup for UC and possible C. Difficile. Subjective:  [name] is a 66 year old Caucasian female who was admitted to anything from 01/11/2012 240 10/20/2011 for copious diarrhea dehydration and hypokalemia. Her C. difficile by PCR was positive. She was treated with vancomycin with a very slow recovery. She had flexible sigmoidoscopy which showed acute colitis without typical changes of C. Diff. She was therefore begun on Solu-Medrol and rapidly improved and was able to go home within 2 days of initiating steroids. Hospitalization was pertinent for severe thrombocytopenia secondary to metronidazole and ascites which required tap and elevated transaminases. Viral markers were negative. She remains on vancomycin. She feels a lot better. She is still having 3-5 stools per day. Stools are semi-formed. She denies melena or rectal bleeding she also denies nocturnal diarrhea. She generally wakes up at 5 in order to have a BM. She complains of flatulence. Her appetite is back to normal and she has gained 10 pounds since her last visit. She is still having lower abdominal cramps.  Current Medications: Current Outpatient Prescriptions  Medication Sig Dispense Refill  . ALPRAZolam (XANAX) 0.5 MG tablet Take 1 tablet (0.5 mg total) by mouth at bedtime as needed for sleep. prn  30 tablet  3  . Blood Glucose Monitoring Suppl (FREESTYLE LITE) DEVI Checking blood sugars twice a day      . desvenlafaxine (PRISTIQ) 50 MG 24 hr tablet Take 50 mg by mouth daily.        Marland Kitchen estradiol (ESTRACE) 1 MG tablet Take 1 mg by mouth daily.        [name] (FLORA-Q) CAPS Take 1 capsule by mouth 2 (two) times daily.  60 capsule  1  . glipiZIDE (GLUCOTROL) 10 MG tablet Take 10 mg by mouth daily.      Marland Kitchen glucose blood (ONE TOUCH TEST STRIPS) test strip 1 each by Other route 3 (three) times daily. Use as instructed      . insulin glargine (LANTUS) 100 UNIT/ML injection Inject 5 Units into the skin every evening.        . Insulin Pen Needle 31G X 6 MM MISC 1 Device by Does not apply route daily.  30 each  11  . Liraglutide (VICTOZA) 18 MG/3ML SOLN Inject 0.6 mg into the skin every morning.       . metoprolol (TOPROL-XL) 100 MG 24 hr tablet 1 tablet by mouth every morning, 1/2 tablet every evening       . NEXIUM 40 MG capsule 40 mg daily. Take 1 by mouth twice a day      . ondansetron (ZOFRAN) 4 MG tablet Take 1 tablet (4 mg total) by mouth daily as needed for nausea.  30 tablet  1  . ONE TOUCH LANCETS MISC by Does not apply route 3 (three) times daily.      . predniSONE (DELTASONE) 10 MG tablet Take 10 mg by mouth daily.      . vancomycin (VANCOCIN) 50 mg/mL oral solution Take 250 mg by mouth every 12 (twelve) hours.      Marland Kitchen DISCONTD: amitriptyline (ELAVIL) 25 MG tablet Take 1 tablet (25 mg total) by mouth at bedtime. start with 1/2 tab at night for 1 week then increase to 1 whole tab.  30 tablet  3  . DISCONTD: bromocriptine (PARLODEL) 2.5 MG tablet Take 2.5 mg by mouth 2 (two) times daily.        Marland Kitchen DISCONTD: glipiZIDE (GLIPIZIDE XL) 2.5 MG 24 hr tablet Take 1  tablet (2.5 mg total) by mouth daily.  30 tablet  11     Objective: Blood pressure 136/84, pulse 78, temperature 97.8 F (36.6 C), temperature source Oral, resp. rate 18, height 5' 2"  (1.575 m), weight 174 lb 3.2 oz (79.017 kg). Patient appears to be in no acute distress. Conjunctiva is pink. Sclera is nonicteric Oropharyngeal mucosa is normal. No neck masses or thyromegaly noted. Cardiac exam with regular rhythm normal S1 and S2. No murmur or gallop noted. Lungs are clear to auscultation. Abdomen is full. Bowel sounds are active. Soft abdomen with mild tenderness and LLQ. No organomegaly or masses. No LE edema or clubbing noted.   Assessment:  #1. Major problem at the present time appears to be ulcerative colitis. She is presently on prednisone and she would need to be on on the medication for maintenance. I believe next up would be 6-MP. I  would like for patient to be acquainted with this medication. If she is agreeable she will need to have TPMT. #2. C. difficile colitis he is on positive stool test. She remains on vancomycin. Will check another stool to make sure it is negative. #3. Elevated transaminases possibly secondary to acute illness and/or hepatic congestion.   Plan:  Continue prednisone at 10 mg by mouth every morning. Stool C. difficile by PCR. She will go to the lab for CBC and comprehensive chemistry panel. Patient advised to get acquainted with 6-MP. We'll make final decision once blood work available for review. Office visit in 8 weeks.

## 2012-02-18 NOTE — Telephone Encounter (Signed)
Notified pt with information. She states will have done when she come back to see Dr. Loanne Drilling... 02/18/12@1 :32pm/LMB

## 2012-02-18 NOTE — Telephone Encounter (Signed)
Message copied by Earnstine Regal on Tue Feb 18, 2012 10:27 AM ------      Message from: Kathreen Cosier      Created: Tue Feb 18, 2012  9:29 AM       Salley Scarlet Lorre Nick,            I called both of Mrs. Turski carriers.  Medicare is her primary ins (no coverage for this inj); BCBS as the 2ndary (they require $30 copay / 100% of the allowed amt).  They don't tell you what the allowed amt is.             Ruby      ----- Message -----         From: Earnstine Regal, MA         Sent: 02/17/2012   3:00 PM           To: Carollee Sires McClinton            Hey Mrs. [name],            This patient is interesting in getting a shingle injection. Do you check with the insurance with that injection? Let me know and i will call her back.            Thank Emeri Estill

## 2012-02-18 NOTE — Patient Instructions (Addendum)
Physician will contact you with results of stool studies and blood tests. Continue prednisone at 10 mg by mouth daily

## 2012-02-19 LAB — CBC WITH DIFFERENTIAL/PLATELET
Basophils Absolute: 0 10*3/uL (ref 0.0–0.1)
Basophils Relative: 0 % (ref 0–1)
Eosinophils Absolute: 0.1 10*3/uL (ref 0.0–0.7)
Eosinophils Relative: 1 % (ref 0–5)
HCT: 39.8 % (ref 36.0–46.0)
MCHC: 33.4 g/dL (ref 30.0–36.0)
MCV: 93.9 fL (ref 78.0–100.0)
Monocytes Absolute: 0.5 10*3/uL (ref 0.1–1.0)
RDW: 14.9 % (ref 11.5–15.5)

## 2012-02-19 LAB — COMPREHENSIVE METABOLIC PANEL
AST: 24 U/L (ref 0–37)
Alkaline Phosphatase: 48 U/L (ref 39–117)
BUN: 21 mg/dL (ref 6–23)
Calcium: 9.2 mg/dL (ref 8.4–10.5)
Chloride: 100 mEq/L (ref 96–112)
Creat: 0.64 mg/dL (ref 0.50–1.10)

## 2012-02-21 ENCOUNTER — Other Ambulatory Visit (INDEPENDENT_AMBULATORY_CARE_PROVIDER_SITE_OTHER): Payer: Self-pay | Admitting: Internal Medicine

## 2012-02-21 LAB — CLOSTRIDIUM DIFFICILE BY PCR: Toxigenic C. Difficile by PCR: NOT DETECTED

## 2012-02-21 MED ORDER — MERCAPTOPURINE 50 MG PO TABS: 100.0000 mg | ORAL_TABLET | Freq: Every day | ORAL | Status: DC

## 2012-02-28 DIAGNOSIS — K5190 Ulcerative colitis, unspecified, without complications: Secondary | ICD-10-CM | POA: Diagnosis not present

## 2012-02-28 DIAGNOSIS — E876 Hypokalemia: Secondary | ICD-10-CM | POA: Diagnosis not present

## 2012-02-29 LAB — BASIC METABOLIC PANEL
BUN: 16 mg/dL (ref 6–23)
Calcium: 9.7 mg/dL (ref 8.4–10.5)
Creat: 0.75 mg/dL (ref 0.50–1.10)
Glucose, Bld: 231 mg/dL — ABNORMAL HIGH (ref 70–99)

## 2012-02-29 LAB — CBC WITH DIFFERENTIAL/PLATELET
Basophils Relative: 0 % (ref 0–1)
Eosinophils Absolute: 0.1 10*3/uL (ref 0.0–0.7)
Eosinophils Relative: 1 % (ref 0–5)
HCT: 40.7 % (ref 36.0–46.0)
Hemoglobin: 13.6 g/dL (ref 12.0–15.0)
MCH: 30.6 pg (ref 26.0–34.0)
MCHC: 33.4 g/dL (ref 30.0–36.0)
Monocytes Absolute: 0.6 10*3/uL (ref 0.1–1.0)
Monocytes Relative: 7 % (ref 3–12)

## 2012-03-02 ENCOUNTER — Ambulatory Visit (INDEPENDENT_AMBULATORY_CARE_PROVIDER_SITE_OTHER): Payer: Medicare Other | Admitting: Endocrinology

## 2012-03-02 ENCOUNTER — Encounter: Payer: Self-pay | Admitting: Endocrinology

## 2012-03-02 VITALS — BP 142/82 | HR 82 | Temp 97.9°F | Ht 62.0 in | Wt 171.0 lb

## 2012-03-02 DIAGNOSIS — E1165 Type 2 diabetes mellitus with hyperglycemia: Secondary | ICD-10-CM

## 2012-03-02 MED ORDER — INSULIN REGULAR HUMAN 100 UNIT/ML IJ SOLN: 10.0000 [IU] | Freq: Three times a day (TID) | INTRAMUSCULAR | Status: DC

## 2012-03-02 NOTE — Patient Instructions (Addendum)
Stop lantus, glipizide, and victoza Start regular insulin 10 units 3x a day (just before each meal)Please come back for a follow-up appointment for 1 month. check your blood sugar twice a day.  vary the time of day when you check, between before the 3 meals, and at bedtime.  also check if you have symptoms of your blood sugar being too high or too low.  please keep a record of the readings and bring it to your next appointment here.  please call us sooner if your blood sugar goes below 70, or if it stays over 200.

## 2012-03-02 NOTE — Progress Notes (Signed)
Subjective:    Patient ID: [name], female    DOB: December 11, 1945, 66 y.o.   MRN: [id number]  HPI Pt returns for f/u of type 2 DM (dx'ed 6967; complicated by peripheral sensory neuropathy).  She would like to manage dm without insulin if possible.  She has increased the victoza to 1.2 mg daily.  she brings a record of her cbg's which i have reviewed today.  It varies from 108-300.  It is in general higher as the day goes on.  She has nausea since she was stared on 6-MP.   Past Medical History  Diagnosis Date  . MENOPAUSAL DISORDER   . REDUCTION MAMMOPLASTY, HX OF 1994    bilateral  . ULCERATIVE COLITIS   . HYPERTENSION   . GERD   . DIABETES MELLITUS, TYPE II   . Thrombocytopenia 01/15/2012    presumably d/t metronidazole    Past Surgical History  Procedure Date  . Abdominal hysterectomy 1980    total, with bladder tack  . Breast surgery 1994    Bilateral, due to fibrocystic breast changes  . Rectocele repair 1990  . Carpal tunnel release 1996    Right  . Right knee arthroscopy 01/1999    then again 10/2000  . L4-l5 diskectomy 08/1999  . Cholecystectomy   . Flexible sigmoidoscopy 01/17/2012    Procedure: FLEXIBLE SIGMOIDOSCOPY;  Surgeon: Rogene Houston, MD;  Location: [facility];  Service: Endoscopy;  Laterality: N/A;    History   Social History  . Marital Status: Married    Spouse Name: N/A    Number of Children: N/A  . Years of Education: N/A   Occupational History  . Not on file.   Social History Main Topics  . Smoking status: Never Smoker   . 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 Alcohol Use: Yes     Rare  . Drug Use: No  . Sexually Active: Not on file   Other Topics Concern  . Not on file   Social History Narrative  . No narrative on file    Current Outpatient Prescriptions on File Prior to Visit    Medication Sig Dispense Refill  . ALPRAZolam (XANAX) 0.5 MG tablet Take 1 tablet (0.5 mg total) by mouth at bedtime as needed for sleep. prn  30 tablet  3  . Blood Glucose Monitoring Suppl (FREESTYLE LITE) DEVI Checking blood sugars twice a day      . desvenlafaxine (PRISTIQ) 50 MG 24 hr tablet Take 50 mg by mouth daily.        Marland Kitchen estradiol (ESTRACE) 1 MG tablet Take 1 mg by mouth daily.        [name] (FLORA-Q) CAPS Take 1 capsule by mouth 2 (two) times daily.  60 capsule  1  . glucose blood (ONE TOUCH TEST STRIPS) test strip 1 each by Other route 3 (three) times daily. Use as instructed      . Insulin Pen Needle 31G X 6 MM MISC 1 Device by Does not apply route daily.  30 each  11  . mercaptopurine (PURINETHOL) 50 MG tablet Take 2 tablets (100 mg total) by mouth daily. Give on an empty stomach 1 hour before or 2 hours after meals. Caution: Chemotherapy.  60 tablet  5  . metoprolol (TOPROL-XL) 100 MG 24 hr tablet 1 tablet by mouth every  morning, 1/2 tablet every evening       . NEXIUM 40 MG capsule 40 mg daily. Take 1 by mouth twice a day      . ondansetron (ZOFRAN) 4 MG tablet Take 1 tablet (4 mg total) by mouth daily as needed for nausea.  30 tablet  1  . ONE TOUCH LANCETS MISC by Does not apply route 3 (three) times daily.      . predniSONE (DELTASONE) 10 MG tablet Take 1 tablet (10 mg total) by mouth daily.  60 tablet  1  . insulin regular (NOVOLIN R) 100 units/mL injection Inject 0.1 mLs (10 Units total) into the skin 3 (three) times daily before meals.  10 mL  11  . DISCONTD: amitriptyline (ELAVIL) 25 MG tablet Take 1 tablet (25 mg total) by mouth at bedtime. start with 1/2 tab at night for 1 week then increase to 1 whole tab.  30 tablet  3  . DISCONTD: bromocriptine (PARLODEL) 2.5 MG tablet Take 2.5 mg by mouth 2 (two) times daily.          Allergies  Allergen Reactions  . Percocet (Oxycodone-Acetaminophen) Itching  . Actos (Pioglitazone)     Edema   . Benzocaine-Menthol   .  Colesevelam     REACTION: gi sxs  . Flagyl (Metronidazole Hcl)     Caused thrombocytopenia.  . Metformin And Related Diarrhea  . Nisoldipine   . Oxycodone-Acetaminophen     REACTION: Itch  . Prilosec (Omeprazole)     Family History  Problem Relation Age of Onset  . Diabetes Mother   . Hypertension Mother   . Heart attack Father     Mid 50's  . Heart disease Father   . Lung disease Father     spot on lung; had lung surgery  . Alcohol abuse Other   . Hypertension Son   . Diabetes Son     BP 142/82  Pulse 82  Temp(Src) 97.9 F (36.6 C) (Oral)  Ht 5' 2"  (1.575 m)  Wt 171 lb (77.565 kg)  BMI 31.28 kg/m2  SpO2 95%   Review of Systems denies hypoglycemia    Objective:   Physical Exam VITAL SIGNS:  See vs page GENERAL: no distress PSYCH: Alert and oriented x 3.  Does not appear anxious nor depressed.     Assessment & Plan:  DM.  She needs insulin.

## 2012-03-03 ENCOUNTER — Telehealth (INDEPENDENT_AMBULATORY_CARE_PROVIDER_SITE_OTHER): Payer: Self-pay | Admitting: *Deleted

## 2012-03-03 ENCOUNTER — Telehealth: Payer: Self-pay | Admitting: *Deleted

## 2012-03-03 MED ORDER — INSULIN REGULAR HUMAN 100 UNIT/ML IJ SOLN: 10.0000 [IU] | Freq: Three times a day (TID) | INTRAMUSCULAR | Status: DC

## 2012-03-03 NOTE — Telephone Encounter (Signed)
[facility] called-they want to know if Humulin R insulin can be changed to Novolin R insulin because of cost for pt. [facility] now uses Novolin brand which is around $22.

## 2012-03-03 NOTE — Telephone Encounter (Signed)
Please call patient and ask her to stop 6-MP. She needs PPD unless she already had one this year. Will initiate paperwork for Remicade infusion.

## 2012-03-03 NOTE — Telephone Encounter (Signed)
Hamdi called and states that she feels that she is having a reaction to the 6-MP. Symptons are : Itching,Headache,Nausea,Chills then she gets Hot , skin is coming off the bottom of her feet. She says that she did take 1 this morning.  Patient advised that I would call her back after addressing with Dr.Rehman. She ask that I call her at [phone]

## 2012-03-03 NOTE — Telephone Encounter (Signed)
ok 

## 2012-03-03 NOTE — Telephone Encounter (Signed)
Rx sent for [name] pharmacy informed.

## 2012-03-03 NOTE — Telephone Encounter (Signed)
Patient was called and made aware. She plans to call PCP and get PPD done and she have them fax to [place] the results. I advised patient that I would have to contact [employer] and get this approved. Remicade Infusion - 5 mg/kg - 0 - 2- 6 weeks then every 8 weeks.

## 2012-03-09 ENCOUNTER — Telehealth: Payer: Self-pay

## 2012-03-09 NOTE — Telephone Encounter (Signed)
Pt called stating that she checks her cbgs three times a day and it is always around 200. Pt says that she was advised to call if this continued for medication adjustment.

## 2012-03-09 NOTE — Telephone Encounter (Signed)
Increase insulin to 15 units tid (qac). Ret as sched

## 2012-03-09 NOTE — Telephone Encounter (Signed)
Pt informed of MD's advisement.

## 2012-03-09 NOTE — Telephone Encounter (Signed)
Pt informed of MD's advisement regarding insulin adjustment. Pt wants to know if she should also take Glipizide because CBG's are running high and also she is feeling jittery.

## 2012-03-09 NOTE — Telephone Encounter (Signed)
No need to take the glipizide.  Call in a few more days of cbg's are still high, and we'll increase again

## 2012-03-13 ENCOUNTER — Telehealth: Payer: Self-pay

## 2012-03-13 NOTE — Telephone Encounter (Signed)
Pt informed of MD's advisement regarding insulin dosage change.

## 2012-03-13 NOTE — Telephone Encounter (Signed)
Patient has has been approved for Remicade Infusion dates 02/21/12- 0614/14 per [facility] Crate. They will send an approval to our office and patient is to have had a PPD prior to infusion. Will arrange infusions week of June 17 th and patient will be notified.

## 2012-03-13 NOTE — Telephone Encounter (Signed)
Pt called stating that her blood sugars are still elevated, high 200's even with increased Novolin R. Pt is requesting advisement from MD.

## 2012-03-13 NOTE — Telephone Encounter (Signed)
Increase the insulin to 20 units tid (qac)

## 2012-03-17 ENCOUNTER — Telehealth (INDEPENDENT_AMBULATORY_CARE_PROVIDER_SITE_OTHER): Payer: Self-pay | Admitting: *Deleted

## 2012-03-17 NOTE — Telephone Encounter (Signed)
Steffi would like for Dr. Laural [name] to please give her a call at [phone]. Would like to speak with Dr. Laural [name] about a doctor that did a study on Collitis.

## 2012-03-18 ENCOUNTER — Telehealth: Payer: Self-pay | Admitting: *Deleted

## 2012-03-18 ENCOUNTER — Encounter: Payer: Self-pay | Admitting: Neurology

## 2012-03-18 ENCOUNTER — Ambulatory Visit (INDEPENDENT_AMBULATORY_CARE_PROVIDER_SITE_OTHER): Payer: Medicare Other | Admitting: Neurology

## 2012-03-18 ENCOUNTER — Other Ambulatory Visit (INDEPENDENT_AMBULATORY_CARE_PROVIDER_SITE_OTHER): Payer: Self-pay | Admitting: Internal Medicine

## 2012-03-18 ENCOUNTER — Telehealth (INDEPENDENT_AMBULATORY_CARE_PROVIDER_SITE_OTHER): Payer: Self-pay | Admitting: *Deleted

## 2012-03-18 VITALS — BP 160/80 | HR 76 | Wt 181.0 lb

## 2012-03-18 DIAGNOSIS — G609 Hereditary and idiopathic neuropathy, unspecified: Secondary | ICD-10-CM | POA: Diagnosis not present

## 2012-03-18 DIAGNOSIS — G629 Polyneuropathy, unspecified: Secondary | ICD-10-CM

## 2012-03-18 MED ORDER — INSULIN REGULAR HUMAN 100 UNIT/ML IJ SOLN: 20.0000 [IU] | Freq: Three times a day (TID) | INTRAMUSCULAR | Status: DC

## 2012-03-18 MED ORDER — PREGABALIN 25 MG PO CAPS: ORAL_CAPSULE | ORAL | Status: DC

## 2012-03-18 MED ORDER — PREDNISONE 10 MG PO TABS: 10.0000 mg | ORAL_TABLET | Freq: Every day | ORAL | Status: DC

## 2012-03-18 NOTE — Telephone Encounter (Signed)
Pt brought in CBG record for Dr. Loanne Drilling to review after increasing insulin dosage to 20 units daily, pt's CBG's are still elevated and pt would advisement on insulin adjustment. Pt aware that SAE is out of office until next Monday and is requesting advisement from PCP on insulin adjustment (readings placed on desk for review).

## 2012-03-18 NOTE — Telephone Encounter (Signed)
See earlier note for response

## 2012-03-18 NOTE — Progress Notes (Signed)
Dear Dr. Edrick Oh,   I saw [name] back in [facility] for her problem with peripheral neuropathy. As you may recall, she is a 66 y.o. year old female with a history of ulcerative colitis and diabetes who has painful dysesthesias in her feet. 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   I felt the neuropathy was likely mainly due to her diabetes and ulcerative colitis. When I started her on Neurontin and got her to a dose of 383m she coincidentally had worsening with tingling in her hands, right greater than left. We stopped the Neurontin which did not seem to help the tingling in the hands. We attempted to start Elavil but at 223mshe felt foggy.   I thought that perhaps one should consider treating her UC to see if her neuropathy would get better. She approached her GI doc who had not heard of a connection between UC and neuropathy. He did not want to pursue immunosuppressant treatment of her UC(or at least did not think it was worth the risk of starting an immunosuppressant).  I started her back on gabapentin and have gotten her to a dose 100/100/200. She does not feel that it has helped her burning pain in her hands and feet. She feels it may have made the hands worse. She feels like it is hard to pick up items with her hands.  She stopped the gabapentin and she feels her neuropathy got better.  I also tried Pamelor but it caused too much sedation.  She was sick with c.difficile colitis and interesting her neuropathy was better during that time.  She now complains of continued burning in her hands.  She is tolerating her burning in her feet.   Medical history, social history, and family history were reviewed and have not changed since the last clinic visit.  Current Outpatient Prescriptions on File Prior to Visit  Medication Sig Dispense Refill  . ALPRAZolam (XANAX) 0.5 MG  tablet Take 1 tablet (0.5 mg total) by mouth at bedtime as needed for sleep. prn  30 tablet  3  . Blood Glucose Monitoring Suppl (FREESTYLE LITE) DEVI Checking blood sugars twice a day      . desvenlafaxine (PRISTIQ) 50 MG 24 hr tablet Take 50 mg by mouth daily.        . Marland Kitchenstradiol (ESTRACE) 1 MG tablet Take 1 mg by mouth daily.        . Maple MirzaFLORA-Q) CAPS Take 1 capsule by mouth 2 (two) times daily.  60 capsule  1  . glucose blood (ONE TOUCH TEST STRIPS) test strip 1 each by Other route 3 (three) times daily. Use as instructed      . Insulin Pen Needle 31G X 6 MM MISC 1 Device by Does not apply route daily.  30 each  11  . insulin regular (NOVOLIN R) 100 units/mL injection Inject 0.2 mLs (20 Units total) into the skin 3 (three) times daily before meals.  10 mL  11  . metoprolol (TOPROL-XL) 100 MG 24 hr tablet 1 tablet by mouth every morning, 1/2 tablet every evening       . NEXIUM 40 MG capsule 40 mg daily. Take 1 by mouth twice a day      . ONE TOUCH LANCETS MISC by Does not apply route 3 (three) times daily.      . predniSONE (DELTASONE) 10  MG tablet Take 1 tablet (10 mg total) by mouth daily.  60 tablet  1  . pregabalin (LYRICA) 25 MG capsule take 1 at night for two weeks, then increase to 1 twice a day.  60 capsule  3  . DISCONTD: amitriptyline (ELAVIL) 25 MG tablet Take 1 tablet (25 mg total) by mouth at bedtime. start with 1/2 tab at night for 1 week then increase to 1 whole tab.  30 tablet  3  . DISCONTD: bromocriptine (PARLODEL) 2.5 MG tablet Take 2.5 mg by mouth 2 (two) times daily.          Allergies  Allergen Reactions  . Percocet (Oxycodone-Acetaminophen) Itching  . Actos (Pioglitazone)     Edema   . Benzocaine-Menthol   . Colesevelam     REACTION: gi sxs  . Flagyl (Metronidazole Hcl)     Caused thrombocytopenia.  . Metformin And Related Diarrhea  . Nisoldipine   . Oxycodone-Acetaminophen     REACTION: Itch  . Prilosec (Omeprazole)     ROS:  13 systems were reviewed  and are unremarkable.  Exam: . Filed Vitals:   03/18/12 1146  BP: 160/80  Pulse: 76  Weight: 181 lb (82.101 kg)   Impression/Recs: 1.  PN - start lyrica 25m and increase to tid.   We will see the patient back in 3 months.  MKavin LeechWJacelyn Grip MD [facility], [place]

## 2012-03-18 NOTE — Telephone Encounter (Signed)
I called patient yesterday but her line was busy. I have no objection to her trying Rowasa suppositories this medication will only work for rectal disease and she has more than just or rectal disease. Will send prescription for a Y. sign prednisone to her pharmacy. [name], please call patient

## 2012-03-18 NOTE — Telephone Encounter (Signed)
Patient was called and made aware via a message on her home phone. Rowasa Supp. 1 per rectal every night #30 with 5 refills/[facility] [place].

## 2012-03-18 NOTE — Telephone Encounter (Signed)
[name] states that she has a friend that has had Colitis for 30 years. The friend sees a Dr. Harrell Lark in [place] located at the [facility]. He treated her with Rowasa Suppositories follows, 1 per rectal every night for  Year , then 1 per rectal  6 nights, till she got down to 1 per week. [name] ask if this may be something that she can try before starting Remicade Infusions? Secondly, she is currently taking Prednisone 10 mg and her prescription is running out. If she needs to continue she will need to have a new prescription call or [facility] [place].

## 2012-03-18 NOTE — Telephone Encounter (Signed)
Patient call and made aware about Remicade.  Also,the question that she had called yesterday about, telephone encounter being sent to [facility].

## 2012-03-18 NOTE — Telephone Encounter (Signed)
Reviewed - increase to 25-25-20

## 2012-03-18 NOTE — Addendum Note (Signed)
Addended by: [name] A on: 03/18/2012 05:14 PM   Modules accepted: Orders

## 2012-03-19 NOTE — Telephone Encounter (Signed)
Pt informed of MD's advisement.

## 2012-03-23 ENCOUNTER — Telehealth: Payer: Self-pay | Admitting: Endocrinology

## 2012-03-23 NOTE — Telephone Encounter (Signed)
please call patient: i received cbg record increase insulin to to 30-30-25 Ret as sched

## 2012-03-24 ENCOUNTER — Encounter (INDEPENDENT_AMBULATORY_CARE_PROVIDER_SITE_OTHER): Payer: Self-pay

## 2012-03-24 MED ORDER — INSULIN REGULAR HUMAN 100 UNIT/ML IJ SOLN: INTRAMUSCULAR | Status: DC

## 2012-03-24 NOTE — Telephone Encounter (Signed)
Pt informed of MD's advisement regarding insulin adjustment-new rx for increased amount sent to pharmacy.

## 2012-03-24 NOTE — Addendum Note (Signed)
Addended by: Legrand Como on: 03/24/2012 08:31 AM   Modules accepted: Orders

## 2012-03-25 ENCOUNTER — Encounter (HOSPITAL_COMMUNITY): Payer: Medicare Other | Attending: Internal Medicine

## 2012-03-25 ENCOUNTER — Telehealth (INDEPENDENT_AMBULATORY_CARE_PROVIDER_SITE_OTHER): Payer: Self-pay | Admitting: *Deleted

## 2012-03-25 VITALS — BP 188/82 | HR 65 | Temp 97.8°F | Wt 181.0 lb

## 2012-03-25 DIAGNOSIS — K5190 Ulcerative colitis, unspecified, without complications: Secondary | ICD-10-CM | POA: Insufficient documentation

## 2012-03-25 MED ORDER — SODIUM CHLORIDE 0.9 % IV SOLN
INTRAVENOUS | Status: DC
  Administered 2012-03-25: 10:00:00 via INTRAVENOUS

## 2012-03-25 MED ORDER — SODIUM CHLORIDE 0.9 % IJ SOLN
10.0000 mL | INTRAMUSCULAR | Status: DC | PRN
  Administered 2012-03-25: 10 mL via INTRAVENOUS

## 2012-03-25 MED ORDER — SODIUM CHLORIDE 0.9 % IV SOLN
5.0000 mg/kg | Freq: Once | INTRAVENOUS | Status: AC
  Administered 2012-03-25: 400 mg via INTRAVENOUS
  Filled 2012-03-25: qty 40

## 2012-03-25 NOTE — Telephone Encounter (Signed)
Eshaal presented to the office today after having had her first Remicade Infusion. She had the following questions:1 Could the Prednisone be causing her to have hair loss , how long will she need to be on this medication , currently she is taking 10 mg daily and has been since she was discharged from the hospital. Gaetano Hawthorne is this a medicaton that she could take if the Remicade failed? May she take Imodium for diarrhea and how often can she take this medication? Since starting the Remicade (03-25-12)can she go the to nursing home to visit her Daddy as this medication lowers her immune system? Can she see a Surgeon about her hemorrhoids? If so who do you recommend in [place]?

## 2012-03-25 NOTE — Progress Notes (Signed)
Tolerated remicade infusion well.

## 2012-03-25 NOTE — Telephone Encounter (Signed)
She can start prednisone taper now. Take 5 mg daily for one week and stop. She can take Imodium OTC 2 mg up to 3 times a day or 4 mg twice daily. Uceris will only be short-term medication and yes she can take it.

## 2012-03-27 NOTE — Telephone Encounter (Signed)
Patient was called and given Dr.Rehman's recommendations

## 2012-03-31 ENCOUNTER — Ambulatory Visit (INDEPENDENT_AMBULATORY_CARE_PROVIDER_SITE_OTHER): Payer: Medicare Other | Admitting: Endocrinology

## 2012-03-31 ENCOUNTER — Encounter: Payer: Self-pay | Admitting: Endocrinology

## 2012-03-31 VITALS — BP 138/82 | HR 75 | Temp 98.6°F | Ht 62.0 in | Wt 182.0 lb

## 2012-03-31 DIAGNOSIS — E1165 Type 2 diabetes mellitus with hyperglycemia: Secondary | ICD-10-CM

## 2012-03-31 MED ORDER — "INSULIN SYRINGE 31G X 5/16"" 0.5 ML MISC": Status: DC

## 2012-03-31 MED ORDER — ONETOUCH DELICA LANCETS MISC: Status: DC

## 2012-03-31 MED ORDER — INSULIN REGULAR HUMAN 100 UNIT/ML IJ SOLN: 35.0000 [IU] | Freq: Three times a day (TID) | INTRAMUSCULAR | Status: DC

## 2012-03-31 NOTE — Progress Notes (Signed)
Subjective:    Patient ID: [name], female    DOB: September 23, 1946, 66 y.o.   MRN: [id number]  HPI Pt returns for f/u of type 2 DM (dx'ed 2694; complicated by peripheral sensory neuropathy).  She has increased the insulin several times.  she brings a record of her cbg's which i have reviewed today.  It varies from 100's-200's.  It is in general higher as the day goes on Past Medical History  Diagnosis Date  . MENOPAUSAL DISORDER   . REDUCTION MAMMOPLASTY, HX OF 1994    bilateral  . ULCERATIVE COLITIS   . HYPERTENSION   . GERD   . DIABETES MELLITUS, TYPE II   . Thrombocytopenia 01/15/2012    presumably d/t metronidazole    Past Surgical History  Procedure Date  . Abdominal hysterectomy 1980    total, with bladder tack  . Breast surgery 1994    Bilateral, due to fibrocystic breast changes  . Rectocele repair 1990  . Carpal tunnel release 1996    Right  . Right knee arthroscopy 01/1999    then again 10/2000  . L4-l5 diskectomy 08/1999  . Cholecystectomy   . Flexible sigmoidoscopy 01/17/2012    Procedure: FLEXIBLE SIGMOIDOSCOPY;  Surgeon: Rogene Houston, MD;  Location: [facility];  Service: Endoscopy;  Laterality: N/A;    History   Social History  . Marital Status: Married    Spouse Name: N/A    Number of Children: N/A  . Years of Education: N/A   Occupational History  . Not on file.   Social History Main Topics  . Smoking status: Never Smoker   . 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 Alcohol Use: Yes     Rare  . Drug Use: No  . Sexually Active: Not on file   Other Topics Concern  . Not on file   Social History Narrative  . No narrative on file    Current Outpatient Prescriptions on File Prior to Visit  Medication Sig Dispense Refill  . ALPRAZolam (XANAX) 0.5 MG tablet Take 1 tablet (0.5 mg total) by mouth  at bedtime as needed for sleep. prn  30 tablet  3  . Blood Glucose Monitoring Suppl (FREESTYLE LITE) DEVI Checking blood sugars twice a day      . desvenlafaxine (PRISTIQ) 50 MG 24 hr tablet Take 50 mg by mouth daily.        Marland Kitchen estradiol (ESTRACE) 1 MG tablet Take 1 mg by mouth daily.        [name] (FLORA-Q) CAPS Take 1 capsule by mouth 2 (two) times daily.  60 capsule  1  . glucose blood (ONE TOUCH TEST STRIPS) test strip 1 each by Other route 3 (three) times daily. Use as instructed      . inFLIXimab (REMICADE) 100 MG injection Inject into the vein.      Marland Kitchen insulin regular (NOVOLIN R,HUMULIN R) 100 units/mL injection Inject 0.35 mLs (35 Units total) into the skin 3 (three) times daily before meals.  40 mL  11  . metoprolol (TOPROL-XL) 100 MG 24 hr tablet 1 tablet by mouth every morning, 1/2 tablet every evening       . NEXIUM 40 MG capsule 40 mg daily. Take 1 by mouth twice a day      . pregabalin (LYRICA) 25 MG capsule take 1 at  night for two weeks, then increase to 1 twice a day.  60 capsule  3  . DISCONTD: amitriptyline (ELAVIL) 25 MG tablet Take 1 tablet (25 mg total) by mouth at bedtime. start with 1/2 tab at night for 1 week then increase to 1 whole tab.  30 tablet  3  . DISCONTD: bromocriptine (PARLODEL) 2.5 MG tablet Take 2.5 mg by mouth 2 (two) times daily.          Allergies  Allergen Reactions  . Percocet (Oxycodone-Acetaminophen) Itching  . Actos (Pioglitazone)     Edema   . Benzocaine-Menthol   . Colesevelam     REACTION: gi sxs  . Flagyl (Metronidazole Hcl)     Caused thrombocytopenia.  . Metformin And Related Diarrhea  . Nisoldipine   . Oxycodone-Acetaminophen     REACTION: Itch  . Prilosec (Omeprazole)     Family History  Problem Relation Age of Onset  . Diabetes Mother   . Hypertension Mother   . Heart attack Father     Mid 109's  . Heart disease Father   . Lung disease Father     spot on lung; had lung surgery  . Alcohol abuse Other   . Hypertension Son    . Diabetes Son     BP 138/82  Pulse 75  Temp 98.6 F (37 C) (Oral)  Ht 5' 2"  (1.575 m)  Wt 182 lb (82.555 kg)  BMI 33.29 kg/m2  SpO2 97%    Review of Systems denies hypoglycemia    Objective:   Physical Exam VITAL SIGNS:  See vs page GENERAL: no distress SKIN:  cbg sites at the fingertips are normal.      Assessment & Plan:  DM.  needs increased rx

## 2012-03-31 NOTE — Patient Instructions (Addendum)
Please Increase the insulin to 35 units 3x a day (just before each meal).  check your blood sugar twice a day.  vary the time of day when you check, between before the 3 meals, and at bedtime.  also check if you have symptoms of your blood sugar being too high or too low.  please keep a record of the readings and bring it to your next appointment here.  please call us sooner if your blood sugar goes below 70, or if it stays over 200. Please come back for a follow-up appointment in 2 months.  Here are some samples of "apidra."  This is not exactly the same as what you take, but not very different.  You can use this when you are away from home. Please be on the lookout for lower blood sugar when you stop the prednisone.  Call if it goes low.

## 2012-04-06 ENCOUNTER — Other Ambulatory Visit: Payer: Self-pay | Admitting: *Deleted

## 2012-04-06 ENCOUNTER — Other Ambulatory Visit: Payer: Self-pay | Admitting: Endocrinology

## 2012-04-06 MED ORDER — GLUCOSE BLOOD VI STRP: ORAL_STRIP | Status: DC

## 2012-04-06 NOTE — Telephone Encounter (Signed)
R'cd fax from [facility] for specific instructions and diagnosis code on rx for test strips. Rx sent.

## 2012-04-07 ENCOUNTER — Other Ambulatory Visit: Payer: Self-pay | Admitting: *Deleted

## 2012-04-07 MED ORDER — GLUCOSE BLOOD VI STRP: ORAL_STRIP | Status: DC

## 2012-04-07 NOTE — Telephone Encounter (Signed)
R'cd fax from [place] stating that per new Medicare guidelines, signature for rx of test strips must say use 1 strip bid. [facility] sent [facility] with new signature.

## 2012-04-08 ENCOUNTER — Encounter (INDEPENDENT_AMBULATORY_CARE_PROVIDER_SITE_OTHER): Payer: Self-pay

## 2012-04-08 ENCOUNTER — Encounter (HOSPITAL_COMMUNITY): Payer: Medicare Other | Attending: Internal Medicine

## 2012-04-08 VITALS — BP 162/81 | HR 76 | Temp 98.0°F | Wt 182.0 lb

## 2012-04-08 DIAGNOSIS — K5190 Ulcerative colitis, unspecified, without complications: Secondary | ICD-10-CM | POA: Diagnosis not present

## 2012-04-08 MED ORDER — SODIUM CHLORIDE 0.9 % IV SOLN
5.0000 mg/kg | Freq: Once | INTRAVENOUS | Status: AC
  Administered 2012-04-08: 400 mg via INTRAVENOUS
  Filled 2012-04-08: qty 40

## 2012-04-08 MED ORDER — SODIUM CHLORIDE 0.9 % IV SOLN
INTRAVENOUS | Status: DC
  Administered 2012-04-08: 11:00:00 via INTRAVENOUS

## 2012-04-08 MED ORDER — SODIUM CHLORIDE 0.9 % IJ SOLN
10.0000 mL | Freq: Once | INTRAMUSCULAR | Status: AC
  Administered 2012-04-08: 10 mL via INTRAVENOUS

## 2012-04-08 NOTE — Progress Notes (Signed)
Tolerated well

## 2012-04-28 ENCOUNTER — Encounter (INDEPENDENT_AMBULATORY_CARE_PROVIDER_SITE_OTHER): Payer: Self-pay | Admitting: Internal Medicine

## 2012-04-28 ENCOUNTER — Ambulatory Visit (INDEPENDENT_AMBULATORY_CARE_PROVIDER_SITE_OTHER): Payer: Medicare Other | Admitting: Internal Medicine

## 2012-04-28 VITALS — BP 130/96 | HR 80 | Temp 97.8°F | Resp 20 | Ht 62.0 in | Wt 187.4 lb

## 2012-04-28 DIAGNOSIS — R600 Localized edema: Secondary | ICD-10-CM

## 2012-04-28 DIAGNOSIS — R609 Edema, unspecified: Secondary | ICD-10-CM | POA: Diagnosis not present

## 2012-04-28 DIAGNOSIS — IMO0001 Reserved for inherently not codable concepts without codable children: Secondary | ICD-10-CM

## 2012-04-28 DIAGNOSIS — K5190 Ulcerative colitis, unspecified, without complications: Secondary | ICD-10-CM | POA: Diagnosis not present

## 2012-04-28 DIAGNOSIS — M2550 Pain in unspecified joint: Secondary | ICD-10-CM | POA: Diagnosis not present

## 2012-04-28 DIAGNOSIS — R635 Abnormal weight gain: Secondary | ICD-10-CM | POA: Diagnosis not present

## 2012-04-28 DIAGNOSIS — M791 Myalgia, unspecified site: Secondary | ICD-10-CM

## 2012-04-28 LAB — CBC WITH DIFFERENTIAL/PLATELET
Basophils Relative: 1 % (ref 0–1)
HCT: 38.9 % (ref 36.0–46.0)
Hemoglobin: 14.2 g/dL (ref 12.0–15.0)
Lymphocytes Relative: 65 % — ABNORMAL HIGH (ref 12–46)
MCHC: 36.5 g/dL — ABNORMAL HIGH (ref 30.0–36.0)
Monocytes Absolute: 0.8 10*3/uL (ref 0.1–1.0)
Monocytes Relative: 9 % (ref 3–12)
Neutro Abs: 2 10*3/uL (ref 1.7–7.7)
Neutrophils Relative %: 24 % — ABNORMAL LOW (ref 43–77)
RBC: 4.36 MIL/uL (ref 3.87–5.11)
WBC: 8.2 10*3/uL (ref 4.0–10.5)

## 2012-04-28 LAB — COMPREHENSIVE METABOLIC PANEL
AST: 27 U/L (ref 0–37)
Albumin: 3.9 g/dL (ref 3.5–5.2)
Alkaline Phosphatase: 37 U/L — ABNORMAL LOW (ref 39–117)
Calcium: 9.4 mg/dL (ref 8.4–10.5)
Chloride: 97 mEq/L (ref 96–112)
Glucose, Bld: 114 mg/dL — ABNORMAL HIGH (ref 70–99)
Potassium: 3.6 mEq/L (ref 3.5–5.3)
Sodium: 136 mEq/L (ref 135–145)
Total Protein: 6.6 g/dL (ref 6.0–8.3)

## 2012-04-28 MED ORDER — FUROSEMIDE 40 MG PO TABS: 40.0000 mg | ORAL_TABLET | ORAL | Status: DC

## 2012-04-28 NOTE — Patient Instructions (Signed)
Can take furosemide or Lasix 40 mg every fourth day as needed for lower extremity edema. Do not take HydroDIURIL on days when you take furosemide. Physician will contact you with results of blood work. Please try to see Dr. Jacelyn Grip regarding muscle and joint pain prior to next Remicade infusion.

## 2012-04-28 NOTE — Progress Notes (Signed)
Presenting complaint;  Followup for ulcerative colitis.  Subjective:  Patient is 66 year old Caucasian female with history of chronic ulcerative colitis who is here for scheduled visit. She was last seen 9 weeks ago. She was intolerant of 6 MP. He was therefore begun on infliximab or Remicade and she has received 2 doses. Last dose was on 04/01/2012. She states her rectal bleeding has stopped she is having 3-4 formed stools daily. Urgency has improved and she has had very few accidents this month. However now she complains of intractable muscle and joint pain. His symptoms started after second dose and she is not seeing any improvement. He has history of fibromyalgia and arthritis she says her symptoms have never been this worse. He also complains of numbness in fingertips but this is not a new symptom. She feels as if her fingers have been mashed. Also complains of lower extremity edema. Her appetite is fair. She denies fever chills or night sweats. She would also like to have her TSH checked with a blood work. She says her glucose levels are staying within the desirable range.  Current Medications: Current Outpatient Prescriptions  Medication Sig Dispense Refill  . ALPRAZolam (XANAX) 0.5 MG tablet Take 1 tablet (0.5 mg total) by mouth at bedtime as needed for sleep. prn  30 tablet  3  . BIOTIN PO Take 1 tablet by mouth daily.      Marland Kitchen desvenlafaxine (PRISTIQ) 50 MG 24 hr tablet Take 50 mg by mouth daily.        Marland Kitchen estradiol (ESTRACE) 1 MG tablet Take 1 mg by mouth daily.        [name] (FLORA-Q) CAPS Take 1 capsule by mouth 2 (two) times daily.  60 capsule  1  . hydrochlorothiazide (HYDRODIURIL) 50 MG tablet Take 50 mg by mouth daily.      Marland Kitchen inFLIXimab (REMICADE) 100 MG injection Inject into the vein.      Marland Kitchen insulin regular (NOVOLIN R,HUMULIN R) 100 units/mL injection Inject 0.35 mLs (35 Units total) into the skin 3 (three) times daily before meals.  40 mL  11  . Insulin Syringe-Needle U-100  (INSULIN SYRINGE .5CC/31GX5/16") 31G X 5/16" 0.5 ML MISC Use as directed three times daily dx 250.02  100 each  5  . metoprolol (TOPROL-XL) 100 MG 24 hr tablet 1 tablet by mouth every morning, 1/2 tablet every evening       . Multiple Vitamins-Minerals (CENTRUM SILVER PO) Take 1 tablet by mouth daily.      Marland Kitchen NEXIUM 40 MG capsule daily. Patient states that she is taking only 1 a day      . ONETOUCH DELICA LANCETS MISC Use as directed twice daily dx 250.02  100 each  3  . pregabalin (LYRICA) 25 MG capsule take 1 at night for two weeks, then increase to 1 twice a day.  60 capsule  3  . DISCONTD: amitriptyline (ELAVIL) 25 MG tablet Take 1 tablet (25 mg total) by mouth at bedtime. start with 1/2 tab at night for 1 week then increase to 1 whole tab.  30 tablet  3  . DISCONTD: bromocriptine (PARLODEL) 2.5 MG tablet Take 2.5 mg by mouth 2 (two) times daily.           Objective: Blood pressure 130/96, pulse 80, temperature 97.8 F (36.6 C), temperature source Oral, resp. rate 20, height 5' 2"  (1.575 m), weight 187 lb 6.4 oz (85.004 kg). Patient is alert and in no acute distress. She moves slowly  from chair to examination table on her own. Conjunctiva is pink. Sclera is nonicteric Oropharyngeal mucosa is normal. No neck masses or thyromegaly noted. Cardiac exam with regular rhythm normal S1 and S2. No murmur or gallop noted. Lungs are clear to auscultation. Abdomen is full. Bowel sounds are normal. Abdomen is soft and nontender without organomegaly or masses  She has 2+ edema to both legs. He does not have swelling to the DIPs, PIPs, MCPs or wrists joints.    Assessment:  #1. Ulcerative colitis. Patient has received 2 doses of infliximab and is doing a lot better from GI standpoint. Rectal bleeding has resolved and she is passing formed stools. However she may be having side affects. If patient is unable to tolerate any of these medications or they don't work, she should consider curative surgery  for UC. #2. Severe myalgias and polyarthralgias. He has history of fibromyalgia and arthritis. However her symptoms have gotten a lot worse since second dose of infliximab. Will do blood work to make sure she has not developed polymyalgia rheumatica. He also needs to Make an appointment to see Dr. Jacelyn Grip her rheumatologist. If musculoskeletal symptoms appeared to be secondary to infliximab will consider switching to another biologic agent. #3. Lower extremity edema and weight gain.   Plan:  Will go to the lab for CBC with differential, a preemptive chemistry panel, sedimentation rate, CRP and TSH. Furosemide 40 mg by mouth every fourth day as needed. Patient should not take hydrochlorothiazide days she takes furosemide. Patient advised to make appointment to see her rheumatologist Dr. Jacelyn Grip prior to next dose of infliximab. Office visit in 8 weeks.

## 2012-04-29 LAB — SEDIMENTATION RATE: Sed Rate: 1 mm/hr (ref 0–22)

## 2012-04-30 ENCOUNTER — Telehealth: Payer: Self-pay | Admitting: Neurology

## 2012-04-30 ENCOUNTER — Telehealth (INDEPENDENT_AMBULATORY_CARE_PROVIDER_SITE_OTHER): Payer: Self-pay | Admitting: *Deleted

## 2012-04-30 DIAGNOSIS — K5190 Ulcerative colitis, unspecified, without complications: Secondary | ICD-10-CM

## 2012-04-30 DIAGNOSIS — D72820 Lymphocytosis (symptomatic): Secondary | ICD-10-CM

## 2012-04-30 LAB — CBC
Hemoglobin: 13.5 g/dL (ref 12.0–15.0)
MCH: 31.5 pg (ref 26.0–34.0)
MCV: 90.7 fL (ref 78.0–100.0)
Platelets: 214 10*3/uL (ref 150–400)
RBC: 4.28 MIL/uL (ref 3.87–5.11)
WBC: 7 10*3/uL (ref 4.0–10.5)

## 2012-04-30 NOTE — Telephone Encounter (Signed)
Per Dr.Rehman the patient will need to have a peripheral smear by pathologist. Patient had a CBC/D on July 31 but this specimen has aged out and the patient will need to have another venipuncture. The peripheral smear will need to be done in a 24 hour period.

## 2012-04-30 NOTE — Telephone Encounter (Signed)
Pt moved her fu appt to 05/11/2012 because her gastro is starting her on remicaid (?) inj and wants patient to see Dr. Jacelyn Grip before this happens. Pt expressed that she would like Dr. Jacelyn Grip to go over her gastro records before her fu appt. They should be in [facility], per pt. Please call GI dr if necessary.

## 2012-05-06 ENCOUNTER — Telehealth (INDEPENDENT_AMBULATORY_CARE_PROVIDER_SITE_OTHER): Payer: Self-pay | Admitting: *Deleted

## 2012-05-06 DIAGNOSIS — IMO0001 Reserved for inherently not codable concepts without codable children: Secondary | ICD-10-CM | POA: Diagnosis not present

## 2012-05-06 DIAGNOSIS — K5190 Ulcerative colitis, unspecified, without complications: Secondary | ICD-10-CM | POA: Diagnosis not present

## 2012-05-06 NOTE — Telephone Encounter (Signed)
Per Dr.Rehman the patient will need to have labs done on 05/18/12. Lab order faxed to [facility].

## 2012-05-11 ENCOUNTER — Other Ambulatory Visit: Payer: Self-pay | Admitting: Neurology

## 2012-05-11 ENCOUNTER — Ambulatory Visit: Payer: Medicare Other | Admitting: Neurology

## 2012-05-11 ENCOUNTER — Encounter: Payer: Self-pay | Admitting: Neurology

## 2012-05-11 ENCOUNTER — Ambulatory Visit (INDEPENDENT_AMBULATORY_CARE_PROVIDER_SITE_OTHER): Payer: Medicare Other | Admitting: Neurology

## 2012-05-11 VITALS — BP 148/88 | HR 84 | Wt 188.0 lb

## 2012-05-11 DIAGNOSIS — G609 Hereditary and idiopathic neuropathy, unspecified: Secondary | ICD-10-CM | POA: Diagnosis not present

## 2012-05-11 DIAGNOSIS — IMO0002 Reserved for concepts with insufficient information to code with codable children: Secondary | ICD-10-CM | POA: Diagnosis not present

## 2012-05-11 DIAGNOSIS — G629 Polyneuropathy, unspecified: Secondary | ICD-10-CM

## 2012-05-11 DIAGNOSIS — M25511 Pain in right shoulder: Secondary | ICD-10-CM

## 2012-05-11 DIAGNOSIS — M47812 Spondylosis without myelopathy or radiculopathy, cervical region: Secondary | ICD-10-CM | POA: Diagnosis not present

## 2012-05-11 DIAGNOSIS — M25519 Pain in unspecified shoulder: Secondary | ICD-10-CM | POA: Diagnosis not present

## 2012-05-11 NOTE — Progress Notes (Signed)
Dear Dr. Asa [name],  I saw  [name] back in [facility] for her problem with mixed sensorimotor neuropathy.  As you may recall, she is a 66 y.o. year old female with a history of ulcerative colitis and diabetes who has a peripheral neuropathy that causes numbness and tingling in her hands and feet.  After investigating it I felt it was likely secondary to her two comorbidities UC and diabetes.  I felt it would be worthwhile in the past to try her on immunomodulating agents to see if this would make the peripheral neuropathy better.    She has tried low dose gabapentin, Elavil and Pamelor in the past.  Interestingly she felt her gabapentin made her hands worse.  She has not tried the Lyrica I had given her at her last visit.  Since I last saw her she has been started on Remicade.  Interestingly, she was referred to see me before she got her next infusion.  However, Dr. Laural [name] apparently thought I was a rheumatologist because she is having right shoulder pain that started about two weeks ago.    The pain and numbness seems to be about the same in her fingers and toes.  Medical history, social history, and family history were reviewed and have not changed since the last clinic visit.  Current Outpatient Prescriptions on File Prior to Visit  Medication Sig Dispense Refill  . ALPRAZolam (XANAX) 0.5 MG tablet Take 1 tablet (0.5 mg total) by mouth at bedtime as needed for sleep. prn  30 tablet  3  . BIOTIN PO Take 1 tablet by mouth daily.      Marland Kitchen desvenlafaxine (PRISTIQ) 50 MG 24 hr tablet Take 50 mg by mouth daily.        Marland Kitchen estradiol (ESTRACE) 1 MG tablet Take 1 mg by mouth daily.        [name] (FLORA-Q) CAPS Take 1 capsule by mouth 2 (two) times daily.  60 capsule  1  . furosemide (LASIX) 40 MG tablet Take 1 tablet (40 mg total) by mouth every Wednesday and Saturday.  30 tablet  0  . hydrochlorothiazide (HYDRODIURIL) 50 MG tablet Take 50 mg by mouth daily.      Marland Kitchen inFLIXimab  (REMICADE) 100 MG injection Inject into the vein.      Marland Kitchen insulin regular (NOVOLIN R,HUMULIN R) 100 units/mL injection Inject 0.35 mLs (35 Units total) into the skin 3 (three) times daily before meals.  40 mL  11  . Insulin Syringe-Needle U-100 (INSULIN SYRINGE .5CC/31GX5/16") 31G X 5/16" 0.5 ML MISC Use as directed three times daily dx 250.02  100 each  5  . metoprolol (TOPROL-XL) 100 MG 24 hr tablet 1 tablet by mouth every morning, 1/2 tablet every evening       . Multiple Vitamins-Minerals (CENTRUM SILVER PO) Take 1 tablet by mouth daily.      Marland Kitchen NEXIUM 40 MG capsule daily. Patient states that she is taking only 1 a day      . ONETOUCH DELICA LANCETS MISC Use as directed twice daily dx 250.02  100 each  3  . pregabalin (LYRICA) 25 MG capsule take 1 at night for two weeks, then increase to 1 twice a day.  60 capsule  3  . DISCONTD: amitriptyline (ELAVIL) 25 MG tablet Take 1 tablet (25 mg total) by mouth at bedtime. start with 1/2 tab at night for 1 week then increase to 1 whole tab.  30 tablet  3  .  DISCONTD: bromocriptine (PARLODEL) 2.5 MG tablet Take 2.5 mg by mouth 2 (two) times daily.          Allergies  Allergen Reactions  . Percocet (Oxycodone-Acetaminophen) Itching  . Actos (Pioglitazone)     Edema   . Benzocaine-Menthol   . Colesevelam     REACTION: gi sxs  . Flagyl (Metronidazole Hcl)     Caused thrombocytopenia.  . Metformin And Related Diarrhea  . Nisoldipine   . Oxycodone-Acetaminophen     REACTION: Itch  . Prilosec (Omeprazole)     ROS:  13 systems were reviewed and are notable for lack of GI symptoms.  All other review of systems are unremarkable.  Exam: . Filed Vitals:   05/11/12 1138  BP: 148/88  Pulse: 84  Weight: 188 lb (85.276 kg)   Gen:  well appearing older women. Shoulder exam:  pain on passive ROM, tenderness to palpation on the lateral clavicle  Impression/Recommendations:  1.  Shoulder pain - really out of my realm, although likely a bursitis or  tendonitis, likely unrelated to Remicade.  I have referred her to a local orthopedist. 2.  Peripheral neuropathy - I did not examine this today, but symptomatically it appears stable.  I would suggest a trial of the pregabalin increasing slowly and just at night for now from 59m qhs by 274mincrements.  However, I am hopeful that her PN gets somewhat better on Remicade as this has been described.    She will follow up with yourself.  [name] [facility], [place]

## 2012-05-12 ENCOUNTER — Other Ambulatory Visit: Payer: Self-pay | Admitting: Orthopedic Surgery

## 2012-05-12 DIAGNOSIS — M25519 Pain in unspecified shoulder: Secondary | ICD-10-CM

## 2012-05-13 ENCOUNTER — Ambulatory Visit
Admission: RE | Admit: 2012-05-13 | Discharge: 2012-05-13 | Disposition: A | Discharge status: Home / Self Care | Payer: Medicare Other | Source: Ambulatory Visit | Attending: Orthopedic Surgery | Admitting: Orthopedic Surgery

## 2012-05-13 DIAGNOSIS — M5020 Other cervical disc displacement, unspecified cervical region: Secondary | ICD-10-CM | POA: Diagnosis not present

## 2012-05-13 DIAGNOSIS — M25519 Pain in unspecified shoulder: Secondary | ICD-10-CM | POA: Diagnosis not present

## 2012-05-13 DIAGNOSIS — M47812 Spondylosis without myelopathy or radiculopathy, cervical region: Secondary | ICD-10-CM | POA: Diagnosis not present

## 2012-05-13 DIAGNOSIS — M719 Bursopathy, unspecified: Secondary | ICD-10-CM | POA: Diagnosis not present

## 2012-05-13 DIAGNOSIS — M19019 Primary osteoarthritis, unspecified shoulder: Secondary | ICD-10-CM | POA: Diagnosis not present

## 2012-05-13 DIAGNOSIS — S43429A Sprain of unspecified rotator cuff capsule, initial encounter: Secondary | ICD-10-CM | POA: Diagnosis not present

## 2012-05-13 DIAGNOSIS — M67919 Unspecified disorder of synovium and tendon, unspecified shoulder: Secondary | ICD-10-CM | POA: Diagnosis not present

## 2012-05-18 DIAGNOSIS — K5190 Ulcerative colitis, unspecified, without complications: Secondary | ICD-10-CM | POA: Diagnosis not present

## 2012-05-18 DIAGNOSIS — IMO0001 Reserved for inherently not codable concepts without codable children: Secondary | ICD-10-CM | POA: Diagnosis not present

## 2012-05-18 DIAGNOSIS — K5289 Other specified noninfective gastroenteritis and colitis: Secondary | ICD-10-CM | POA: Diagnosis not present

## 2012-05-19 DIAGNOSIS — M19019 Primary osteoarthritis, unspecified shoulder: Secondary | ICD-10-CM | POA: Diagnosis not present

## 2012-05-19 DIAGNOSIS — M5020 Other cervical disc displacement, unspecified cervical region: Secondary | ICD-10-CM | POA: Diagnosis not present

## 2012-05-19 LAB — CBC WITH DIFFERENTIAL/PLATELET
Eosinophils Relative: 2 % (ref 0–5)
HCT: 40.4 % (ref 36.0–46.0)
Hemoglobin: 14.1 g/dL (ref 12.0–15.0)
Lymphocytes Relative: 66 % — ABNORMAL HIGH (ref 12–46)
MCHC: 34.9 g/dL (ref 30.0–36.0)
MCV: 91.2 fL (ref 78.0–100.0)
Monocytes Absolute: 0.7 10*3/uL (ref 0.1–1.0)
Monocytes Relative: 8 % (ref 3–12)
Neutro Abs: 2.3 10*3/uL (ref 1.7–7.7)
RDW: 14.2 % (ref 11.5–15.5)
WBC: 9.1 10*3/uL (ref 4.0–10.5)

## 2012-05-20 ENCOUNTER — Encounter (HOSPITAL_COMMUNITY): Payer: Medicare Other | Attending: Internal Medicine

## 2012-05-20 VITALS — BP 158/80 | HR 76 | Temp 98.4°F | Resp 18 | Wt 188.0 lb

## 2012-05-20 DIAGNOSIS — K5190 Ulcerative colitis, unspecified, without complications: Secondary | ICD-10-CM

## 2012-05-20 MED ORDER — SODIUM CHLORIDE 0.9 % IV SOLN
INTRAVENOUS | Status: DC
  Administered 2012-05-20: 11:00:00 via INTRAVENOUS

## 2012-05-20 MED ORDER — SODIUM CHLORIDE 0.9 % IV SOLN
5.0000 mg/kg | Freq: Once | INTRAVENOUS | Status: AC
  Administered 2012-05-20: 400 mg via INTRAVENOUS
  Filled 2012-05-20: qty 40

## 2012-05-20 MED ORDER — SODIUM CHLORIDE 0.9 % IJ SOLN
10.0000 mL | INTRAMUSCULAR | Status: DC | PRN
  Administered 2012-05-20: 10 mL via INTRAVENOUS

## 2012-05-20 NOTE — Progress Notes (Signed)
Tolerated infusion well.

## 2012-05-25 ENCOUNTER — Telehealth (INDEPENDENT_AMBULATORY_CARE_PROVIDER_SITE_OTHER): Payer: Self-pay | Admitting: *Deleted

## 2012-05-25 DIAGNOSIS — K5190 Ulcerative colitis, unspecified, without complications: Secondary | ICD-10-CM

## 2012-05-25 NOTE — Telephone Encounter (Signed)
Per Dr.Rehman the patient will need to have CBC?d in 8 weeks then office visit. Lab is noted for 07/06/12 , and office visit with Dr.Rehman on 07/07/12.

## 2012-05-25 NOTE — Telephone Encounter (Signed)
Apt has been scheduled for 07/07/12 at 3:00 with Dr. Laural [name].

## 2012-05-26 DIAGNOSIS — M5020 Other cervical disc displacement, unspecified cervical region: Secondary | ICD-10-CM | POA: Diagnosis not present

## 2012-05-29 DIAGNOSIS — M47812 Spondylosis without myelopathy or radiculopathy, cervical region: Secondary | ICD-10-CM | POA: Diagnosis not present

## 2012-06-04 ENCOUNTER — Encounter: Payer: Self-pay | Admitting: Endocrinology

## 2012-06-04 ENCOUNTER — Ambulatory Visit: Payer: Medicare Other | Admitting: Endocrinology

## 2012-06-04 ENCOUNTER — Other Ambulatory Visit (INDEPENDENT_AMBULATORY_CARE_PROVIDER_SITE_OTHER): Payer: Medicare Other

## 2012-06-04 ENCOUNTER — Ambulatory Visit (INDEPENDENT_AMBULATORY_CARE_PROVIDER_SITE_OTHER): Payer: Medicare Other | Admitting: Endocrinology

## 2012-06-04 VITALS — BP 130/78 | HR 85 | Temp 98.0°F | Ht 62.0 in | Wt 187.0 lb

## 2012-06-04 DIAGNOSIS — E1165 Type 2 diabetes mellitus with hyperglycemia: Secondary | ICD-10-CM

## 2012-06-04 LAB — HEMOGLOBIN A1C: Hgb A1c MFr Bld: 6.6 % — ABNORMAL HIGH (ref 4.6–6.5)

## 2012-06-04 MED ORDER — INSULIN REGULAR HUMAN 100 UNIT/ML IJ SOLN: 35.0000 [IU] | Freq: Three times a day (TID) | INTRAMUSCULAR | Status: DC

## 2012-06-04 NOTE — Progress Notes (Signed)
Subjective:    Patient ID: [name], female    DOB: 1946/07/11, 66 y.o.   MRN: [id number]  HPI Pt returns for f/u of type 2 DM (dx'ed 2536; complicated by peripheral sensory neuropathy).  she brings a record of her cbg's which i have reviewed today.  All are in the 100's.  There is no trend throughout the day. Past Medical History  Diagnosis Date  . MENOPAUSAL DISORDER   . REDUCTION MAMMOPLASTY, HX OF 1994    bilateral  . ULCERATIVE COLITIS   . HYPERTENSION   . GERD   . DIABETES MELLITUS, TYPE II   . Thrombocytopenia 01/15/2012    presumably d/t metronidazole    Past Surgical History  Procedure Date  . Abdominal hysterectomy 1980    total, with bladder tack  . Breast surgery 1994    Bilateral, due to fibrocystic breast changes  . Rectocele repair 1990  . Carpal tunnel release 1996    Right  . Right knee arthroscopy 01/1999    then again 10/2000  . L4-l5 diskectomy 08/1999  . Cholecystectomy   . Flexible sigmoidoscopy 01/17/2012    Procedure: FLEXIBLE SIGMOIDOSCOPY;  Surgeon: Rogene Houston, MD;  Location: [facility];  Service: Endoscopy;  Laterality: N/A;    History   Social History  . Marital Status: Married    Spouse Name: N/A    Number of Children: N/A  . Years of Education: N/A   Occupational History  . Not on file.   Social History Main Topics  . Smoking status: Never Smoker   . 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 Alcohol Use: Yes     Rare  . Drug Use: No  . Sexually Active: Not on file   Other Topics Concern  . Not on file   Social History Narrative  . No narrative on file    Current Outpatient Prescriptions on File Prior to Visit  Medication Sig Dispense Refill  . ALPRAZolam (XANAX) 0.5 MG tablet Take 1 tablet (0.5 mg total) by mouth at bedtime as needed for sleep. prn  30 tablet  3  .  BIOTIN PO Take 1 tablet by mouth daily.      Marland Kitchen desvenlafaxine (PRISTIQ) 50 MG 24 hr tablet Take 50 mg by mouth daily.        Marland Kitchen estradiol (ESTRACE) 1 MG tablet Take 1 mg by mouth daily.        [name] (FLORA-Q) CAPS Take 1 capsule by mouth 2 (two) times daily.  60 capsule  1  . furosemide (LASIX) 40 MG tablet Take 1 tablet (40 mg total) by mouth every Wednesday and Saturday.  30 tablet  0  . hydrochlorothiazide (HYDRODIURIL) 50 MG tablet Take 50 mg by mouth daily.      Marland Kitchen inFLIXimab (REMICADE) 100 MG injection Inject into the vein.      Marland Kitchen insulin regular (NOVOLIN R,HUMULIN R) 100 units/mL injection Inject 0.35 mLs (35 Units total) into the skin 3 (three) times daily before meals.  40 mL  11  . Insulin Syringe-Needle U-100 (INSULIN SYRINGE .5CC/31GX5/16") 31G X 5/16" 0.5 ML MISC Use as directed three times daily dx 250.02  100 each  5  . metoprolol (TOPROL-XL) 100 MG 24 hr tablet 1 tablet by mouth every morning, 1/2 tablet every evening       . Multiple  Vitamins-Minerals (CENTRUM SILVER PO) Take 1 tablet by mouth daily.      Marland Kitchen NEXIUM 40 MG capsule daily. Patient states that she is taking only 1 a day      . ONETOUCH DELICA LANCETS MISC Use as directed twice daily dx 250.02  100 each  3  . DISCONTD: amitriptyline (ELAVIL) 25 MG tablet Take 1 tablet (25 mg total) by mouth at bedtime. start with 1/2 tab at night for 1 week then increase to 1 whole tab.  30 tablet  3  . DISCONTD: bromocriptine (PARLODEL) 2.5 MG tablet Take 2.5 mg by mouth 2 (two) times daily.          Allergies  Allergen Reactions  . Percocet (Oxycodone-Acetaminophen) Itching  . Actos (Pioglitazone)     Edema   . Benzocaine-Menthol   . Colesevelam     REACTION: gi sxs  . Flagyl (Metronidazole Hcl)     Caused thrombocytopenia.  . Metformin And Related Diarrhea  . Nisoldipine   . Oxycodone-Acetaminophen     REACTION: Itch  . Prilosec (Omeprazole)     Family History  Problem Relation Age of Onset  . Diabetes Mother   .  Hypertension Mother   . Heart attack Father     Mid 31's  . Heart disease Father   . Lung disease Father     spot on lung; had lung surgery  . Alcohol abuse Other   . Hypertension Son   . Diabetes Son     BP 130/78  Pulse 85  Temp 98 F (36.7 C) (Oral)  Ht 5' 2"  (1.575 m)  Wt 187 lb (84.823 kg)  BMI 34.20 kg/m2  SpO2 93%    Review of Systems denies hypoglycemia    Objective:   Physical Exam VITAL SIGNS:  See vs page GENERAL: no distress Pulses: dorsalis pedis intact bilat.   Feet: no deformity.  no ulcer on the feet.  feet are of normal color and temp.  Trace bilat leg edema Neuro: sensation is intact to touch on the feet.  Lab Results  Component Value Date   HGBA1C 6.6* 06/04/2012      Assessment & Plan:  DM: well-controlled

## 2012-06-04 NOTE — Patient Instructions (Addendum)
Please continue the insulin at 35 units 3x a day (just before each meal).   check your blood sugar twice a day.  vary the time of day when you check, between before the 3 meals, and at bedtime.  also check if you have symptoms of your blood sugar being too high or too low.  please keep a record of the readings and bring it to your next appointment here.  please call us sooner if your blood sugar goes below 70, or if it stays over 200.   Please come back for a follow-up appointment in 3 months.   blood tests are being requested for you today.  You will receive a letter with results.

## 2012-06-05 ENCOUNTER — Ambulatory Visit: Payer: Medicare Other | Admitting: Neurology

## 2012-06-08 ENCOUNTER — Telehealth: Payer: Self-pay | Admitting: *Deleted

## 2012-06-08 NOTE — Telephone Encounter (Signed)
Called pt to inform of lab results, pt informed via VM and to callback office with any questions/concerns (letter also mailed to pt).

## 2012-06-09 DIAGNOSIS — M5020 Other cervical disc displacement, unspecified cervical region: Secondary | ICD-10-CM | POA: Diagnosis not present

## 2012-06-15 DIAGNOSIS — M25519 Pain in unspecified shoulder: Secondary | ICD-10-CM | POA: Diagnosis not present

## 2012-06-19 ENCOUNTER — Telehealth (INDEPENDENT_AMBULATORY_CARE_PROVIDER_SITE_OTHER): Payer: Self-pay | Admitting: *Deleted

## 2012-06-19 NOTE — Telephone Encounter (Signed)
Per Dr.Rehman the patient may take the Imodium three times a day as follows, 1 in the morning, 1 at lunch, and 1 in the evening. Patient called and made aware.

## 2012-06-19 NOTE — Telephone Encounter (Signed)
Patient called and states that she has for 2 weeks had diarrhea,not everyday. But when it does she will go 6-8 times that day,.No blood seen but there is a mucous that appears pasty Stools are not formed as they had been. She feels jittery,nervous. Taking Imodium 1 every morning and 1 in the evening. This is not working like it use to . Next Remicade 07-01-12 , Labs are due 07-06-12 and a office visit is 07-07-12. Dr.Rehman will be made aware.Patient may be reached at [phone].

## 2012-06-23 ENCOUNTER — Other Ambulatory Visit (INDEPENDENT_AMBULATORY_CARE_PROVIDER_SITE_OTHER): Payer: Self-pay | Admitting: Internal Medicine

## 2012-06-25 ENCOUNTER — Encounter (INDEPENDENT_AMBULATORY_CARE_PROVIDER_SITE_OTHER): Payer: Self-pay | Admitting: *Deleted

## 2012-06-25 ENCOUNTER — Other Ambulatory Visit (INDEPENDENT_AMBULATORY_CARE_PROVIDER_SITE_OTHER): Payer: Self-pay | Admitting: *Deleted

## 2012-06-25 DIAGNOSIS — K5190 Ulcerative colitis, unspecified, without complications: Secondary | ICD-10-CM | POA: Diagnosis not present

## 2012-07-01 ENCOUNTER — Encounter (HOSPITAL_COMMUNITY): Payer: Medicare Other | Attending: Internal Medicine

## 2012-07-01 VITALS — BP 174/79 | HR 74 | Temp 97.4°F | Resp 18

## 2012-07-01 DIAGNOSIS — K5190 Ulcerative colitis, unspecified, without complications: Secondary | ICD-10-CM | POA: Diagnosis not present

## 2012-07-01 MED ORDER — SODIUM CHLORIDE 0.9 % IJ SOLN
10.0000 mL | INTRAMUSCULAR | Status: DC | PRN
  Administered 2012-07-01: 10 mL via INTRAVENOUS

## 2012-07-01 MED ORDER — SODIUM CHLORIDE 0.9 % IV SOLN
400.0000 mg | Freq: Once | INTRAVENOUS | Status: AC
  Administered 2012-07-01: 400 mg via INTRAVENOUS
  Filled 2012-07-01: qty 40

## 2012-07-01 MED ORDER — SODIUM CHLORIDE 0.9 % IV SOLN
INTRAVENOUS | Status: DC
  Administered 2012-07-01: 14:00:00 via INTRAVENOUS

## 2012-07-01 NOTE — Progress Notes (Signed)
Tolerated infusion well.

## 2012-07-06 DIAGNOSIS — K5190 Ulcerative colitis, unspecified, without complications: Secondary | ICD-10-CM | POA: Diagnosis not present

## 2012-07-07 ENCOUNTER — Encounter (INDEPENDENT_AMBULATORY_CARE_PROVIDER_SITE_OTHER): Payer: Self-pay | Admitting: Internal Medicine

## 2012-07-07 ENCOUNTER — Ambulatory Visit (INDEPENDENT_AMBULATORY_CARE_PROVIDER_SITE_OTHER): Payer: Medicare Other | Admitting: Internal Medicine

## 2012-07-07 VITALS — BP 140/90 | HR 80 | Temp 97.6°F | Resp 18 | Ht 62.0 in | Wt 191.0 lb

## 2012-07-07 DIAGNOSIS — K5190 Ulcerative colitis, unspecified, without complications: Secondary | ICD-10-CM

## 2012-07-07 DIAGNOSIS — I10 Essential (primary) hypertension: Secondary | ICD-10-CM

## 2012-07-07 DIAGNOSIS — D72820 Lymphocytosis (symptomatic): Secondary | ICD-10-CM

## 2012-07-07 DIAGNOSIS — K589 Irritable bowel syndrome without diarrhea: Secondary | ICD-10-CM | POA: Diagnosis not present

## 2012-07-07 DIAGNOSIS — R609 Edema, unspecified: Secondary | ICD-10-CM

## 2012-07-07 DIAGNOSIS — R600 Localized edema: Secondary | ICD-10-CM

## 2012-07-07 LAB — CBC WITH DIFFERENTIAL/PLATELET
Eosinophils Relative: 2 % (ref 0–5)
Lymphocytes Relative: 72 % — ABNORMAL HIGH (ref 12–46)
Lymphs Abs: 6.4 10*3/uL — ABNORMAL HIGH (ref 0.7–4.0)
MCV: 92 fL (ref 78.0–100.0)
Neutro Abs: 1.8 10*3/uL (ref 1.7–7.7)
Neutrophils Relative %: 20 % — ABNORMAL LOW (ref 43–77)
Platelets: 207 10*3/uL (ref 150–400)
RBC: 4.23 MIL/uL (ref 3.87–5.11)
WBC: 8.9 10*3/uL (ref 4.0–10.5)

## 2012-07-07 MED ORDER — PSYLLIUM 28 % PO PACK: 1.0000 | PACK | Freq: Every day | ORAL | Status: DC

## 2012-07-07 MED ORDER — FLORA-Q PO CAPS: 1.0000 | ORAL_CAPSULE | Freq: Every day | ORAL | Status: DC

## 2012-07-07 MED ORDER — HYDROCHLOROTHIAZIDE 50 MG PO TABS: 50.0000 mg | ORAL_TABLET | ORAL | Status: DC

## 2012-07-07 MED ORDER — FUROSEMIDE 40 MG PO TABS: 40.0000 mg | ORAL_TABLET | ORAL | Status: DC

## 2012-07-07 NOTE — Progress Notes (Signed)
Presenting complaint;  Followup for ulcerative colitis. Abnormal peripheral smear. Subjective:   patient is 66 year old Caucasian female who is here for scheduled visit accompanied by her husband. She was last seen on 04/28/2012. She has gained 4 pounds since her last visit. She is having less joint pains following infliximab infusion. She has good appetite. She continues to have 6-8 stools per day. Most of her stools are formed and small volume. Occasionally she has diarrhea when she is stressed or upset. She denies nocturnal bowel movements. Last time she saw blood in her stools was in April 2013.  Current Medications: Current Outpatient Prescriptions  Medication Sig Dispense Refill  . ALPRAZolam (XANAX) 0.5 MG tablet Take 1 tablet (0.5 mg total) by mouth at bedtime as needed for sleep. prn  30 tablet  3  . desvenlafaxine (PRISTIQ) 50 MG 24 hr tablet Take 50 mg by mouth daily.        . diphenhydrAMINE (BENADRYL) 25 MG tablet Take 25 mg by mouth at bedtime.      Marland Kitchen estradiol (ESTRACE) 1 MG tablet Take 1 mg by mouth daily.        [name] (FLORA-Q) CAPS Take 1 capsule by mouth 2 (two) times daily.  60 capsule  1  . furosemide (LASIX) 40 MG tablet TAKE 1 TABLET (40 MG TOTAL) BY MOUTH EVERY WEDNESDAY AND SATURDAY.  30 tablet  1  . hydrochlorothiazide (HYDRODIURIL) 50 MG tablet Take 50 mg by mouth daily.      Marland Kitchen inFLIXimab (REMICADE) 100 MG injection Inject into the vein.      Marland Kitchen insulin regular (NOVOLIN R,HUMULIN R) 100 units/mL injection Inject 0.35 mLs (35 Units total) into the skin 3 (three) times daily before meals.  40 mL  11  . Insulin Syringe-Needle U-100 (INSULIN SYRINGE .5CC/31GX5/16") 31G X 5/16" 0.5 ML MISC Use as directed three times daily dx 250.02  100 each  5  . metoprolol (TOPROL-XL) 100 MG 24 hr tablet 1 tablet by mouth every morning, 1/2 tablet every evening       . NEXIUM 40 MG capsule daily. Patient states that she is taking only 1 a day      . ONETOUCH DELICA LANCETS MISC Use  as directed twice daily dx 250.02  100 each  3  . traMADol (ULTRAM) 50 MG tablet Take 50 mg by mouth as needed.       Marland Kitchen DISCONTD: amitriptyline (ELAVIL) 25 MG tablet Take 1 tablet (25 mg total) by mouth at bedtime. start with 1/2 tab at night for 1 week then increase to 1 whole tab.  30 tablet  3  . DISCONTD: bromocriptine (PARLODEL) 2.5 MG tablet Take 2.5 mg by mouth 2 (two) times daily.           Objective: Blood pressure 140/90, pulse 80, temperature 97.6 F (36.4 C), temperature source Oral, resp. rate 18, height 5' 2"  (1.575 m), weight 191 lb (86.637 kg). Conjunctiva is pink. Sclera is nonicteric Oropharyngeal mucosa is normal. No neck masses or thyromegaly noted. Cardiac exam with regular rhythm normal S1 and S2. No murmur or gallop noted. Lungs are clear to auscultation. Abdomen is symmetrical. Bowel sounds are normal. Abdomen is soft and nontender without hepato-splenomegaly.  No LE edema or clubbing noted.  Labs/studies Results: CBC from 07/06/2012. It is not from 06/25/2012 as reflected in epic. WBC 8.9, H&H 13.3 and 38.9, platelet count 207K, Neutrophils 20%, lymphocytes 72%(absolute 6400).  Assessment:  #1. Ulcerative colitis. She appears to be in remission.  She was begun on infliximab 03/25/2012. After receiving 3 doses for induction( 0, 2 and 6 weeks) she is now on maintenance every 8 weeks. She is having frequent stools but most of her stools are formed therefore she also has IBS along with UC. #2. Lymphocytosis. This was initially noted on 04/28/2012 but appears straight and started even before she was begun on infliximab. Peripheral smear review by pathologist in August 2013 suggested a reactive process. Since lymphocytosis persists need to rule out CLL. If Dr. Tressie Stalker feels that we should stop infliximab will do so   Plan:  Consultation with Dr. Tressie Stalker regarding absolute lymphocytosis. Imodium OTC 2 mg once or twice daily. Metamucil 3-4 g by mouth  daily. Office visit in 3 months.

## 2012-07-07 NOTE — Patient Instructions (Addendum)
Can take Imodium OTC 2 mg once or twice daily(before breakfast and lunch). Metamucil 3-4 g or 1 packet by mouth daily at bedtime.

## 2012-07-13 DIAGNOSIS — M67919 Unspecified disorder of synovium and tendon, unspecified shoulder: Secondary | ICD-10-CM | POA: Diagnosis not present

## 2012-07-13 DIAGNOSIS — M719 Bursopathy, unspecified: Secondary | ICD-10-CM | POA: Diagnosis not present

## 2012-07-13 DIAGNOSIS — G5600 Carpal tunnel syndrome, unspecified upper limb: Secondary | ICD-10-CM | POA: Diagnosis not present

## 2012-07-30 ENCOUNTER — Encounter (INDEPENDENT_AMBULATORY_CARE_PROVIDER_SITE_OTHER): Payer: Self-pay | Admitting: *Deleted

## 2012-08-10 ENCOUNTER — Other Ambulatory Visit: Payer: Self-pay | Admitting: Orthopedic Surgery

## 2012-08-10 DIAGNOSIS — M25512 Pain in left shoulder: Secondary | ICD-10-CM

## 2012-08-10 DIAGNOSIS — M25511 Pain in right shoulder: Secondary | ICD-10-CM

## 2012-08-10 DIAGNOSIS — M25519 Pain in unspecified shoulder: Secondary | ICD-10-CM | POA: Diagnosis not present

## 2012-08-11 ENCOUNTER — Encounter (HOSPITAL_COMMUNITY): Payer: Medicare Other | Attending: Internal Medicine | Admitting: Oncology

## 2012-08-11 ENCOUNTER — Encounter (HOSPITAL_COMMUNITY): Payer: Self-pay | Admitting: Oncology

## 2012-08-11 VITALS — BP 131/79 | HR 73 | Temp 97.6°F | Resp 16 | Ht 61.0 in | Wt 191.7 lb

## 2012-08-11 DIAGNOSIS — E119 Type 2 diabetes mellitus without complications: Secondary | ICD-10-CM

## 2012-08-11 DIAGNOSIS — I10 Essential (primary) hypertension: Secondary | ICD-10-CM | POA: Diagnosis not present

## 2012-08-11 DIAGNOSIS — K5190 Ulcerative colitis, unspecified, without complications: Secondary | ICD-10-CM | POA: Diagnosis not present

## 2012-08-11 DIAGNOSIS — D72820 Lymphocytosis (symptomatic): Secondary | ICD-10-CM | POA: Diagnosis not present

## 2012-08-11 LAB — CBC WITH DIFFERENTIAL/PLATELET
HCT: 40.8 % (ref 36.0–46.0)
Hemoglobin: 14.6 g/dL (ref 12.0–15.0)
Lymphocytes Relative: 65 % — ABNORMAL HIGH (ref 12–46)
Lymphs Abs: 4.5 10*3/uL — ABNORMAL HIGH (ref 0.7–4.0)
Monocytes Absolute: 0.6 10*3/uL (ref 0.1–1.0)
Monocytes Relative: 9 % (ref 3–12)
Neutro Abs: 1.7 10*3/uL (ref 1.7–7.7)
Neutrophils Relative %: 25 % — ABNORMAL LOW (ref 43–77)
RBC: 4.47 MIL/uL (ref 3.87–5.11)
WBC: 6.9 10*3/uL (ref 4.0–10.5)

## 2012-08-11 NOTE — Patient Instructions (Addendum)
[facility]  Discharge Instructions  RECOMMENDATIONS MADE BY THE CONSULTANT AND ANY TEST RESULTS WILL BE SENT TO YOUR REFERRING DOCTOR.   EXAM FINDINGS BY MD TODAY AND SIGNS AND SYMPTOMS TO REPORT TO CLINIC OR PRIMARY MD:  We will just watch your labs for a few months.  INSTRUCTIONS GIVEN AND DISCUSSED: Labs monthly  SPECIAL INSTRUCTIONS/FOLLOW-UP: This could be related to remicade. Dr. Tressie Stalker does not think it is anything to worry about at this point. We will watch for changes.   I acknowledge that I have been informed and understand all the instructions given to me and received a copy. I do not have any more questions at this time, but understand that I may call the [facility] at [facility] at [phone] during business hours should I have any further questions or need assistance in obtaining follow-up care.    __________________________________________  _____________  __________ Signature of Patient or Authorized Representative            Date                   Time    __________________________________________ Nurse's Signature

## 2012-08-11 NOTE — Progress Notes (Signed)
[name]   Name: [name] Date: 08/11/2012 MRN: [id number] DOB: 1946-07-18    CC: Jennifer Grant, MD  Jennifer Laser, MD   DIAGNOSIS: The primary encounter diagnosis was ULCERATIVE COLITIS. A diagnosis of Lymphocytosis was also pertinent to this visit.   HISTORY OF PRESENT ILLNESS:[name] is a 66 y.o. female who is was referred to the [facility] for lymphocytosis.  She has a past medical history significant for ulcerative colitis, history of C. difficile colitis, diabetes mellitus type 2, dyslipidemia, hypertension, GERD, peripheral neuropathy, and polyarthralgia.  Chart review reveals that on 04/28/2012 the patient had a sudden lymphocytosis of 5.4, followed by a lymphocytosis of 6.0 and 6.4 on 05/06/2012 and 06/25/2012 respectively. Dating back to 05/29/2010, the patient really did not have any appreciable lymphocytosis. She had one incidence of lymphocytosis on 10/11/1998 1204.1, but all other differentials, excluding the aforementioned, were within normal limits.  Interestingly, according to Dr. Olevia [name] note, she began infliximab on 03/25/2012 for ulcerative colitis, just prior to her first laboratory work that revealed lymphocytosis.  She received 3 doses for induction (0, 2, and 6 weeks) and she is now on maintenance every 6 weeks.  She has had ulcerative colitis for 20-25 years, but most recently has had an exacerbation requiring intervention, namely Remicade.  The patient denies any complaints. She reports she feels well. She denies any B. symptomatology including fevers, chills, night sweats, loss of appetite, unintentional weight loss, abdominal discomfort, early satiety. She denies any increase in infections or requirements for antibiotics.  Her only new medication within the past 6 months is Remicade and Lasix.  Otherwise, the patient denies any complaints. Complete ROS questioning is negative. The patient denies any  headaches, dizziness, double vision, fevers, chills, night sweats, nausea, vomiting, constipation, abdominal pain, chest pain, heart palpitations, blood in stool, black tarry stool, urinary pain, urinary burning, urinary frequency, hematuria.  I personally reviewed and went over laboratory results with the patient.   FAMILY HISTORY: family history includes Alcohol abuse in her other; Diabetes in her mother and son; Heart attack in her father; Heart disease in her father; Hypertension in her mother and son; and Lung disease in her father.  patient denies any leukemia or lymphoma and family history.  Patient's maternal grandfather and grandmother passed away from cancer. She thinks her father passed with colon cancer grandmother had gastric cancer. Her mother passed with you 45 due to sepsis. Her father is alive at the age of 33 and is living at the [facility]. She has 3 sisters who are alive and well. She has one son who is 59 years old. She has 2 grandchildren. She is the oldest of her siblings.   PAST MEDICAL HISTORY:  has a past medical history of MENOPAUSAL DISORDER; REDUCTION MAMMOPLASTY, HX OF (1994); ULCERATIVE COLITIS; HYPERTENSION; GERD; DIABETES MELLITUS, TYPE II; Thrombocytopenia (01/15/2012); and Rotator cuff tear.       CURRENT MEDICATIONS: See CHL   SOCIAL HISTORY:  reports that she has never smoked. She has never used smokeless tobacco. She reports that she drinks alcohol. She reports that she does not use illicit drugs.  Patient lives in [place], [place]. She finished high school. 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  She lives with her husband at home and they were married for 57 years. She occasionally has an alcoholic beverage, potentially to alcoholic beverages per year. She denies any tobacco abuse  or illicit drug abuse.    ALLERGIES: Percocet; Actos; Benzocaine-menthol; Colesevelam; Flagyl; Metformin and related; Nisoldipine;  Oxycodone-acetaminophen; and Prilosec   LABORATORY DATA:  CBC    Component Value Date/Time   WBC 8.9 06/25/2012 0831   RBC 4.23 06/25/2012 0831   HGB 13.3 06/25/2012 0831   HCT 38.9 06/25/2012 0831   PLT 207 06/25/2012 0831   MCV 92.0 06/25/2012 0831   MCH 31.4 06/25/2012 0831   MCHC 34.2 06/25/2012 0831   RDW 13.7 06/25/2012 0831   LYMPHSABS 6.4* 06/25/2012 0831   MONOABS 0.6 06/25/2012 0831   EOSABS 0.2 06/25/2012 0831   BASOSABS 0.0 06/25/2012 0831    Results for [name], [name] (MRN [id number]) as of 08/11/2012 11:42  Ref. Range 05/29/2010 10:37 10/11/2010 10:46 06/26/2011 12:20 01/09/2012 16:46 01/11/2012 20:00 01/14/2012 19:14 01/19/2012 07:15 01/28/2012 14:55 02/18/2012 16:39 02/28/2012 16:45 04/28/2012 15:52 05/06/2012 10:33 06/25/2012 08:31  Lymphocytes Absolute Latest Range: 0.7-4.0 K/uL 3.1 4.1 (H) 2.8 3.0 0.8 1.5 1.3 1.8 3.2 3.7 5.4 (H) 6.0 (H) 6.4 (H)     RADIOGRAPHY: 01/11/2012  *RADIOLOGY REPORT*  Clinical Data: Ulcerative colitis. Abdominal pain and diarrhea.  Anorexia.  CT ABDOMEN AND PELVIS WITH CONTRAST  Technique: Multidetector CT imaging of the abdomen and pelvis was  performed following the standard protocol during bolus  administration of intravenous contrast.  Contrast: 153m OMNIPAQUE IOHEXOL 300 MG/ML SOLN  Comparison: None.  Findings: Moderate to severe diffuse colitis is seen with  pericolonic inflammatory changes, consistent with history of  ulcerative colitis. There is no evidence of pneumatosis or portal  venous gas. No evidence of abscess. A small amount of free fluid  is seen in the pelvis, paracolic gutters, and right perihepatic  space. No evidence of dilated small bowel loops.  Surgical clips is seen from prior cholecystectomy. The abdominal  parenchymal organs are normal in appearance. No evidence of  hydronephrosis. No soft tissue masses or lymphadenopathy  identified. Previous hysterectomy noted.  IMPRESSION:  1. Moderate to severe diffuse colitis,  consistent with known  history of ulcerative colitis.  2. Mild ascites. No evidence of abscess, pneumatosis, or  pneumoperitoneum.  Original Report Authenticated By: JMarlaine Hind M.D.          REVIEW OF SYSTEMS: Patient reports no health concerns.   PHYSICAL EXAM:  height is 5' 1"  (1.549 m) and weight is 191 lb 11.2 oz (86.955 kg).  General appearance: alert, cooperative, appears stated age, no distress and moderately obese Head: Normocephalic, without obvious abnormality, atraumatic Neck: no adenopathy and supple, symmetrical, trachea midline Lymph nodes: Cervical, supraclavicular, and axillary nodes normal. Resp: clear to auscultation bilaterally and normal percussion bilaterally Back: symmetric, no curvature. ROM normal. No CVA tenderness. Cardio: regular rate and rhythm, S1, S2 normal, no murmur, click, rub or gallop GI: soft, non-tender; bowel sounds normal; no masses,  no organomegaly Extremities: extremities normal, atraumatic, no cyanosis or edema Neurologic: Grossly normal     IMPRESSION:  1. Absolute lymphocytosis beginning on 04/28/2012 and persisting since then. Lymphocytes were within normal limits in May 2013 and previously dating back to 2011. 2. Started Remicade infusions on 03/25/2012 for ulcerative colitis. 3. Ulcerative colitis, recently started Remicade, now on maintenance Remicade every 8 weeks. 4. Diabetes mellitus, type II 5. Hypertension 6. History of C. difficile infection 7. GERD 8. Dyslipidemia   PLAN:  1.I personally reviewed and went over laboratory results with the patient. 2. Patient education provided regarding lymphocytosis. 3. Laboratory work today: CBC with differential, peripheral blood smear  for Dr. Tressie Stalker review. 4. Recommend that she continue with Remicade has prescribed under Dr. Laural [name] is care. 5. We will perform CBCs with differential every 4 weeks.  We will monitor closely. 6. Return to clinic in 4 months time for followup.  If laboratory work becomes grossly abnormal, we will certainly see the patient in sooner than in 4 months.  Her lymphocytosis at this point time is very mild and we will watch for stability.  All questions were answered. The patient knows to call the clinic with any problems, questions, or concerns.  Patient and plan discussed with Dr. Everardo All and he is in agreement with the aforementioned. Patient seen and examined by Dr. Everardo All as well.  [name]

## 2012-08-12 ENCOUNTER — Ambulatory Visit (HOSPITAL_COMMUNITY): Payer: Medicare Other

## 2012-08-12 ENCOUNTER — Encounter (HOSPITAL_COMMUNITY): Payer: Medicare Other

## 2012-08-12 VITALS — Wt 194.0 lb

## 2012-08-12 DIAGNOSIS — K5190 Ulcerative colitis, unspecified, without complications: Secondary | ICD-10-CM

## 2012-08-12 MED ORDER — SODIUM CHLORIDE 0.9 % IV SOLN
INTRAVENOUS | Status: DC
  Administered 2012-08-12: 11:00:00 via INTRAVENOUS

## 2012-08-12 MED ORDER — SODIUM CHLORIDE 0.9 % IJ SOLN: 10.0000 mL | INTRAMUSCULAR | Status: DC | PRN

## 2012-08-12 MED ORDER — SODIUM CHLORIDE 0.9 % IV SOLN
5.0000 mg/kg | Freq: Once | INTRAVENOUS | Status: AC
  Administered 2012-08-12: 400 mg via INTRAVENOUS
  Filled 2012-08-12: qty 40

## 2012-08-12 NOTE — Progress Notes (Signed)
Infusion complete. Patient tolerated well.

## 2012-08-17 ENCOUNTER — Ambulatory Visit (INDEPENDENT_AMBULATORY_CARE_PROVIDER_SITE_OTHER): Payer: Medicare Other | Admitting: Internal Medicine

## 2012-08-17 ENCOUNTER — Encounter: Payer: Self-pay | Admitting: Internal Medicine

## 2012-08-17 VITALS — BP 132/72 | HR 92 | Temp 98.4°F | Ht 62.0 in | Wt 191.0 lb

## 2012-08-17 DIAGNOSIS — F411 Generalized anxiety disorder: Secondary | ICD-10-CM

## 2012-08-17 DIAGNOSIS — K5190 Ulcerative colitis, unspecified, without complications: Secondary | ICD-10-CM

## 2012-08-17 DIAGNOSIS — Z23 Encounter for immunization: Secondary | ICD-10-CM

## 2012-08-17 DIAGNOSIS — E1165 Type 2 diabetes mellitus with hyperglycemia: Secondary | ICD-10-CM

## 2012-08-17 DIAGNOSIS — IMO0001 Reserved for inherently not codable concepts without codable children: Secondary | ICD-10-CM

## 2012-08-17 DIAGNOSIS — F419 Anxiety disorder, unspecified: Secondary | ICD-10-CM

## 2012-08-17 MED ORDER — VENLAFAXINE HCL ER 75 MG PO CP24: 75.0000 mg | ORAL_CAPSULE | Freq: Every day | ORAL | Status: DC

## 2012-08-17 MED ORDER — ALPRAZOLAM 0.5 MG PO TABS: 0.5000 mg | ORAL_TABLET | Freq: Every evening | ORAL | Status: DC | PRN

## 2012-08-17 NOTE — Progress Notes (Signed)
Subjective:    Patient ID: [name], female    DOB: 1945/11/10, 66 y.o.   MRN: [id number]  HPI  Here for follow up - reviewed chronic medical issues  Chronic neuropathy -ongoing feet numbness, bilateral - chronic issue for years - NCS 2007, repeated summer 2012 unremarkable by pt report (not available). Intol of amitrip, trazadone, effexor, cymbalta, and gabapentin   anxiety - started on pristiq by obg after poor tol of other meds - uses prn xanax but ?other non habit forming med - ?generic alternative  DM2 - follows with endo for same - dx 2010 - intol of oral meds and now on insulin.  she was unable to tolerate metformin (diarrhea), januvia (headache), byetta (abdominal bruising), actos (edema), onglyza, glipizide and bromocrip. states cbg's are well-controlled. pt says her diet and exercise are good. Notes continued B feet burning sensation  hypertension - reports compliance with ongoing medical treatment and no changes in medication dose or frequency. denies adverse side effects related to current therapy. would prefer to have generics for cost control if possible - chronic LE edema  Dyslipidemia - intol of prior rx wth crestor, lipitor and zocor - poorly tolerated due to myalgias -   GERD - reports compliance with ongoing medical treatment and no changes in medication dose or frequency. denies adverse side effects related to current therapy.   Ulcerative colitis, chronic diarrhea associated with same -no bleeding or abdominal pain symptoms at this time - follows with GI for same - reports compliance with ongoing medical treatment and no changes in medication dose or frequency. denies adverse side effects related to current therapy.    Past Medical History  Diagnosis Date  . MENOPAUSAL DISORDER   . REDUCTION MAMMOPLASTY, HX OF 1994    bilateral  . ULCERATIVE COLITIS   . HYPERTENSION   . GERD   . DIABETES MELLITUS, TYPE II   . Thrombocytopenia 01/15/2012    presumably d/t  metronidazole  . Rotator cuff tear     bilateral     Review of Systems  Constitutional: Negative for fever and unexpected weight change.  Respiratory: Negative for cough and shortness of breath.   Cardiovascular: Negative for chest pain and palpitations.  Neurological: Positive for numbness. Negative for syncope and headaches.       Objective:   Physical Exam BP 132/72  Pulse 92  Temp 98.4 F (36.9 C) (Oral)  Ht 5' 2"  (1.575 m)  Wt 191 lb (86.637 kg)  BMI 34.93 kg/m2  SpO2 94% Wt Readings from Last 3 Encounters:  08/17/12 191 lb (86.637 kg)  08/12/12 194 lb (87.998 kg)  08/11/12 191 lb 11.2 oz (86.955 kg)   Constitutional: She is overweight, appears well-developed and well-nourished. No distress. Neck: Normal range of motion. Neck supple. No JVD present. No thyromegaly present.  Cardiovascular: Normal rate, regular rhythm and normal heart sounds.  No murmur heard. No BLE edema. Pulmonary/Chest: Effort normal and breath sounds normal. No respiratory distress. She has no wheezes.  Abdominal: Soft. Bowel sounds are normal. She exhibits no distension. There is no tenderness. no masses Skin: Skin is warm and dry. No rash noted. No erythema.  Psychiatric: She has a normal mood and affect. Her behavior is normal. Judgment and thought content normal.    Lab Results  Component Value Date   WBC 6.9 08/11/2012   HGB 14.6 08/11/2012   HCT 40.8 08/11/2012   PLT 200 08/11/2012   CHOL 175 01/24/2011   TRIG 538.0* 01/24/2011  HDL 40.30 01/24/2011   LDLDIRECT 69.5 01/24/2011   ALT 17 04/28/2012   AST 27 04/28/2012   NA 136 04/28/2012   K 3.6 04/28/2012   CL 97 04/28/2012   CREATININE 0.69 04/28/2012   BUN 15 04/28/2012   CO2 31 04/28/2012   TSH 2.080 04/28/2012   INR 1.25 01/14/2012   HGBA1C 6.6* 06/04/2012   MICROALBUR 1.1 05/29/2010       Assessment & Plan:  See problem list. Medications and labs reviewed today.

## 2012-08-17 NOTE — Assessment & Plan Note (Signed)
Follows with endo for same - poor tolerance of many oral medications but seems well controlled on current combination associated with neuropathy in feet - but intol of gabapentin and prior elavil trial   Lab Results  Component Value Date   HGBA1C 6.6* 06/04/2012

## 2012-08-17 NOTE — Patient Instructions (Signed)
It was good to see you today. We have reviewed your prior records including labs and tests today Change Pristiq to generic venlafaxine - Your prescription(s) and refills have been submitted to your pharmacy. Please take as directed and contact our office if you believe you are having problem(s) with the medication(s). Other Medications reviewed and updated, no other changes at this time. Flu shot and Shingles vaccine addressed today Please schedule followup in 6 months, call sooner if problems.

## 2012-08-17 NOTE — Assessment & Plan Note (Signed)
Follows with GI for same - Chronic diarrhea

## 2012-08-17 NOTE — Assessment & Plan Note (Signed)
Chronic issues, exac by complicated aging of father and father in law Intolerant of cymbalta, [name], elavil, buspar trials in past - now on pristiq + xanax, but request change to generic Does not recall taking generic effexor (despite priro records on same), willing to try now due to cost of Pristiq continue prn xanax

## 2012-08-18 ENCOUNTER — Ambulatory Visit
Admission: RE | Admit: 2012-08-18 | Discharge: 2012-08-18 | Disposition: A | Discharge status: Home / Self Care | Payer: Medicare Other | Source: Ambulatory Visit | Attending: Orthopedic Surgery | Admitting: Orthopedic Surgery

## 2012-08-18 DIAGNOSIS — M25512 Pain in left shoulder: Secondary | ICD-10-CM

## 2012-08-18 DIAGNOSIS — M19019 Primary osteoarthritis, unspecified shoulder: Secondary | ICD-10-CM | POA: Diagnosis not present

## 2012-08-18 DIAGNOSIS — M25511 Pain in right shoulder: Secondary | ICD-10-CM

## 2012-08-18 DIAGNOSIS — M25519 Pain in unspecified shoulder: Secondary | ICD-10-CM | POA: Diagnosis not present

## 2012-08-18 MED ORDER — IOHEXOL 180 MG/ML  SOLN
10.0000 mL | Freq: Once | INTRAMUSCULAR | Status: AC | PRN
  Administered 2012-08-18: 10 mL via INTRA_ARTICULAR

## 2012-08-21 ENCOUNTER — Other Ambulatory Visit: Payer: Self-pay | Admitting: Internal Medicine

## 2012-08-21 NOTE — Telephone Encounter (Signed)
Faced [facility] back to [facility]...lmb

## 2012-09-01 ENCOUNTER — Encounter: Payer: Self-pay | Admitting: Endocrinology

## 2012-09-01 ENCOUNTER — Ambulatory Visit (INDEPENDENT_AMBULATORY_CARE_PROVIDER_SITE_OTHER): Payer: Medicare Other | Admitting: Endocrinology

## 2012-09-01 VITALS — BP 128/84 | HR 85 | Temp 98.1°F | Wt 196.0 lb

## 2012-09-01 DIAGNOSIS — E1165 Type 2 diabetes mellitus with hyperglycemia: Secondary | ICD-10-CM

## 2012-09-01 NOTE — Progress Notes (Signed)
Subjective:    Patient ID: [name], female    DOB: Aug 31, 1946, 66 y.o.   MRN: [id number]  HPI Pt returns for f/u of type 2 DM (dx'ed 1324; complicated by peripheral sensory neuropathy; she takes reg insulin due to cost of meds).  she brings a record of her cbg's which i have reviewed today.  It varies from 97 to high-100's.  It is lowest in the afternoon, and higher at all other times.  It is higher at hs than in am.  Past Medical History  Diagnosis Date  . MENOPAUSAL DISORDER   . REDUCTION MAMMOPLASTY, HX OF 1994    bilateral  . ULCERATIVE COLITIS   . HYPERTENSION   . GERD   . DIABETES MELLITUS, TYPE II   . Thrombocytopenia 01/15/2012    presumably d/t metronidazole  . Rotator cuff tear     bilateral    Past Surgical History  Procedure Date  . Abdominal hysterectomy 1980    total, with bladder tack  . Breast surgery 1994    Bilateral, due to fibrocystic breast changes  . Rectocele repair 1990  . Carpal tunnel release 1996    Right  . Right knee arthroscopy 01/1999    then again 10/2000  . L4-l5 diskectomy 08/1999  . Cholecystectomy   . Flexible sigmoidoscopy 01/17/2012    Procedure: FLEXIBLE SIGMOIDOSCOPY;  Surgeon: Rogene Houston, MD;  Location: [facility];  Service: Endoscopy;  Laterality: N/A;    History   Social History  . Marital Status: Married    Spouse Name: N/A    Number of Children: N/A  . Years of Education: N/A   Occupational History  . Not on file.   Social History Main Topics  . Smoking status: Never Smoker   . 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 Alcohol Use: Yes     Comment: Rare  . Drug Use: No  . Sexually Active: Not on file   Other Topics Concern  . Not on file   Social History Narrative  . No narrative on file    Current Outpatient Prescriptions on File Prior to Visit   Medication Sig Dispense Refill  . ALPRAZolam (XANAX) 0.5 MG tablet TAKE 1 TABLET AT BEDTIME AS NEEDED FOR SLEEP  30 tablet  2  . diphenhydrAMINE (BENADRYL) 25 MG tablet Take 25 mg by mouth at bedtime.      . diphenhydramine-acetaminophen (TYLENOL PM) 25-500 MG TABS Take 1 tablet by mouth at bedtime as needed.      Marland Kitchen estradiol (ESTRACE) 1 MG tablet Take 1 mg by mouth daily.        [name] (FLORA-Q) CAPS Take 1 capsule by mouth daily.  60 capsule  1  . furosemide (LASIX) 40 MG tablet Take 40 mg by mouth every other day.      . hydrochlorothiazide (HYDRODIURIL) 50 MG tablet Take 50 mg by mouth 3 (three) times a week. Takes on days she doesn't take furosemide      . inFLIXimab (REMICADE) 100 MG injection Inject into the vein.      Marland Kitchen insulin regular (NOVOLIN R,HUMULIN R) 100 units/mL injection Inject 0.35 mLs (35 Units total) into the skin 3 (three) times daily before meals.  40 mL  11  . Insulin Syringe-Needle U-100 (INSULIN SYRINGE .5CC/31GX5/16") 31G X 5/16" 0.5 ML MISC Use  as directed three times daily dx 250.02  100 each  5  . loperamide (IMODIUM) 2 MG capsule Take 2 mg by mouth 3 (three) times daily as needed.      . metoprolol (TOPROL-XL) 100 MG 24 hr tablet 1 tablet by mouth every morning, 1/2 tablet every evening       . NEXIUM 40 MG capsule daily. Patient states that she is taking only 1 a day      . ONETOUCH DELICA LANCETS MISC Use as directed twice daily dx 250.02  100 each  3  . psyllium (METAMUCIL SMOOTH TEXTURE) 28 % packet Take 1 packet by mouth at bedtime.  30 packet  3  . traMADol (ULTRAM) 50 MG tablet Take 50 mg by mouth as needed.       . venlafaxine XR (EFFEXOR XR) 75 MG 24 hr capsule Take 1 capsule (75 mg total) by mouth daily.  30 capsule  5  . [DISCONTINUED] amitriptyline (ELAVIL) 25 MG tablet Take 1 tablet (25 mg total) by mouth at bedtime. start with 1/2 tab at night for 1 week then increase to 1 whole tab.  30 tablet  3  . [DISCONTINUED] bromocriptine (PARLODEL) 2.5 MG  tablet Take 2.5 mg by mouth 2 (two) times daily.          Allergies  Allergen Reactions  . Percocet (Oxycodone-Acetaminophen) Itching  . Actos (Pioglitazone)     Edema   . Benzocaine-Menthol   . Colesevelam     REACTION: gi sxs  . Flagyl (Metronidazole Hcl)     Caused thrombocytopenia.  . Metformin And Related Diarrhea  . Nisoldipine   . Oxycodone-Acetaminophen     REACTION: Itch  . Prilosec (Omeprazole)     Family History  Problem Relation Age of Onset  . Diabetes Mother   . Hypertension Mother   . Heart attack Father     Mid 17's  . Heart disease Father   . Lung disease Father     spot on lung; had lung surgery  . Alcohol abuse Other   . Hypertension Son   . Diabetes Son     BP 128/84  Pulse 85  Temp 98.1 F (36.7 C) (Oral)  Wt 196 lb (88.905 kg)  SpO2 95%    Review of Systems denies hypoglycemia    Objective:   Physical Exam VITAL SIGNS:  See vs page GENERAL: no distress SKIN:  Insulin injection sites at the anterior abdomen are normal, except for a few ecchymoses.  Lab Results  Component Value Date   HGBA1C 6.9* 09/01/2012      Assessment & Plan:  DM: well-controlled

## 2012-09-01 NOTE — Patient Instructions (Addendum)
Please change the insulin to 3x a day (just before each meal) 45-30-45 units.   check your blood sugar twice a day.  vary the time of day when you check, between before the 3 meals, and at bedtime.  also check if you have symptoms of your blood sugar being too high or too low.  please keep a record of the readings and bring it to your next appointment here.  please call us sooner if your blood sugar goes below 70, or if it stays over 200.   Please come back for a follow-up appointment in 3 months.   blood tests are being requested for you today.  We'll contact you with results.

## 2012-09-02 LAB — HEMOGLOBIN A1C: Mean Plasma Glucose: 151 mg/dL — ABNORMAL HIGH (ref <117)

## 2012-09-03 ENCOUNTER — Ambulatory Visit: Payer: Medicare Other | Admitting: Endocrinology

## 2012-09-03 DIAGNOSIS — IMO0002 Reserved for concepts with insufficient information to code with codable children: Secondary | ICD-10-CM | POA: Diagnosis not present

## 2012-09-08 ENCOUNTER — Other Ambulatory Visit (INDEPENDENT_AMBULATORY_CARE_PROVIDER_SITE_OTHER): Payer: Self-pay | Admitting: Internal Medicine

## 2012-09-10 ENCOUNTER — Other Ambulatory Visit (HOSPITAL_COMMUNITY): Payer: Medicare Other

## 2012-09-21 ENCOUNTER — Encounter (HOSPITAL_COMMUNITY): Payer: Medicare Other | Attending: Internal Medicine

## 2012-09-21 ENCOUNTER — Encounter (HOSPITAL_COMMUNITY): Payer: Medicare Other

## 2012-09-21 VITALS — Wt 196.2 lb

## 2012-09-21 DIAGNOSIS — K5190 Ulcerative colitis, unspecified, without complications: Secondary | ICD-10-CM

## 2012-09-21 DIAGNOSIS — D72820 Lymphocytosis (symptomatic): Secondary | ICD-10-CM

## 2012-09-21 LAB — CBC WITH DIFFERENTIAL/PLATELET
Basophils Absolute: 0 10*3/uL (ref 0.0–0.1)
Eosinophils Relative: 1 % (ref 0–5)
HCT: 38.5 % (ref 36.0–46.0)
Lymphocytes Relative: 67 % — ABNORMAL HIGH (ref 12–46)
Lymphs Abs: 4.2 10*3/uL — ABNORMAL HIGH (ref 0.7–4.0)
MCV: 91.2 fL (ref 78.0–100.0)
Monocytes Absolute: 0.4 10*3/uL (ref 0.1–1.0)
Neutro Abs: 1.6 10*3/uL — ABNORMAL LOW (ref 1.7–7.7)
RBC: 4.22 MIL/uL (ref 3.87–5.11)
WBC: 6.3 10*3/uL (ref 4.0–10.5)

## 2012-09-21 MED ORDER — SODIUM CHLORIDE 0.9 % IJ SOLN
INTRAMUSCULAR | Status: AC
  Filled 2012-09-21: qty 10

## 2012-09-21 MED ORDER — SODIUM CHLORIDE 0.9 % IV SOLN
INTRAVENOUS | Status: DC
  Administered 2012-09-21: 250 mL via INTRAVENOUS

## 2012-09-21 MED ORDER — SODIUM CHLORIDE 0.9 % IV SOLN
5.0000 mg/kg | Freq: Once | INTRAVENOUS | Status: AC
  Administered 2012-09-21: 400 mg via INTRAVENOUS
  Filled 2012-09-21: qty 40

## 2012-09-21 NOTE — Progress Notes (Signed)
Tolerated infusion well.Marland Kitchen

## 2012-10-12 ENCOUNTER — Encounter (HOSPITAL_COMMUNITY): Payer: Medicare Other | Attending: Internal Medicine

## 2012-10-12 ENCOUNTER — Ambulatory Visit (INDEPENDENT_AMBULATORY_CARE_PROVIDER_SITE_OTHER): Payer: Medicare Other | Admitting: Internal Medicine

## 2012-10-12 ENCOUNTER — Encounter (INDEPENDENT_AMBULATORY_CARE_PROVIDER_SITE_OTHER): Payer: Self-pay | Admitting: Internal Medicine

## 2012-10-12 VITALS — BP 140/80 | HR 78 | Temp 97.4°F | Resp 18 | Ht 62.0 in | Wt 195.8 lb

## 2012-10-12 DIAGNOSIS — D72820 Lymphocytosis (symptomatic): Secondary | ICD-10-CM | POA: Insufficient documentation

## 2012-10-12 DIAGNOSIS — K5190 Ulcerative colitis, unspecified, without complications: Secondary | ICD-10-CM

## 2012-10-12 DIAGNOSIS — R197 Diarrhea, unspecified: Secondary | ICD-10-CM

## 2012-10-12 LAB — CBC WITH DIFFERENTIAL/PLATELET
Basophils Absolute: 0 10*3/uL (ref 0.0–0.1)
Eosinophils Relative: 2 % (ref 0–5)
HCT: 39.1 % (ref 36.0–46.0)
Hemoglobin: 14.2 g/dL (ref 12.0–15.0)
Lymphocytes Relative: 64 % — ABNORMAL HIGH (ref 12–46)
MCHC: 36.3 g/dL — ABNORMAL HIGH (ref 30.0–36.0)
MCV: 91.6 fL (ref 78.0–100.0)
Monocytes Absolute: 0.6 10*3/uL (ref 0.1–1.0)
Monocytes Relative: 7 % (ref 3–12)
Neutro Abs: 2.3 10*3/uL (ref 1.7–7.7)
RDW: 12.9 % (ref 11.5–15.5)
WBC: 8.7 10*3/uL (ref 4.0–10.5)

## 2012-10-12 NOTE — Progress Notes (Signed)
Presenting complaint;  Followup for ulcerative colitis. Patient complains of diarrhea.  Subjective:  Patient is 67 year old Caucasian female who is chronic ulcerative colitis and was last seen 3 months ago. She is currently on infliximab for maintenance. Last infusion was on 09/21/2012. He believes she is tolerating this medication well. She does complain of diarrhea but she hasn't passed any blood per rectum and several months. She is using Imodium OTC on when necessary basis. She has anywhere from 2-5 stools per day. Consistency varies from loose to formed. She has not had any accidents and she denies nocturnal diarrhea. She has intermittent postprandial nausea but no vomiting. Nexium is working well as far as her heartburn is concerned. She continues to have bilateral knee pain felt to be due to osteoarthrosis. He also complains of neck numbness involving tips of fingers of both hands she wonders if she is allergic to some ingredient in cough that she uses for quilting. She was seen by Dr. Tressie Stalker and Despina Arias, [place] in November 2013 and will be seen again in March 2014.  Current Medications: Current Outpatient Prescriptions  Medication Sig Dispense Refill  . ALPRAZolam (XANAX) 0.5 MG tablet TAKE 1 TABLET AT BEDTIME AS NEEDED FOR SLEEP  30 tablet  2  . diphenhydrAMINE (BENADRYL) 25 MG tablet Take 25 mg by mouth at bedtime.      Marland Kitchen estradiol (ESTRACE) 1 MG tablet Take 1 mg by mouth daily.        [name] (FLORA-Q) CAPS Take 1 capsule by mouth daily.  60 capsule  1  . furosemide (LASIX) 40 MG tablet TAKE 1 TABLET (40 MG TOTAL) BY MOUTH 4 (FOUR) TIMES A WEEK.  30 tablet  0  . hydrochlorothiazide (HYDRODIURIL) 50 MG tablet Take 50 mg by mouth 3 (three) times a week. Takes on days she doesn't take furosemide      . inFLIXimab (REMICADE) 100 MG injection Inject into the vein.      Marland Kitchen insulin regular (NOVOLIN R,HUMULIN R) 100 units/mL injection Inject 0.35 mLs (35 Units total) into the skin 3  (three) times daily before meals.  40 mL  11  . Insulin Syringe-Needle U-100 (INSULIN SYRINGE .5CC/31GX5/16") 31G X 5/16" 0.5 ML MISC Use as directed three times daily dx 250.02  100 each  5  . loperamide (IMODIUM) 2 MG capsule Take 2 mg by mouth 3 (three) times daily as needed.      . metoprolol (TOPROL-XL) 100 MG 24 hr tablet 1 tablet by mouth every morning, 1/2 tablet every evening       . NEXIUM 40 MG capsule daily. Patient states that she is taking only 1 a day      . ONETOUCH DELICA LANCETS MISC Use as directed twice daily dx 250.02  100 each  3  . traMADol (ULTRAM) 50 MG tablet Take 50 mg by mouth as needed.       . venlafaxine XR (EFFEXOR XR) 75 MG 24 hr capsule Take 1 capsule (75 mg total) by mouth daily.  30 capsule  5  . psyllium (METAMUCIL SMOOTH TEXTURE) 28 % packet Take 1 packet by mouth at bedtime.  30 packet  3  . [DISCONTINUED] amitriptyline (ELAVIL) 25 MG tablet Take 1 tablet (25 mg total) by mouth at bedtime. start with 1/2 tab at night for 1 week then increase to 1 whole tab.  30 tablet  3  . [DISCONTINUED] bromocriptine (PARLODEL) 2.5 MG tablet Take 2.5 mg by mouth 2 (two) times daily.  Objective: Blood pressure 140/80, pulse 78, temperature 97.4 F (36.3 C), temperature source Oral, resp. rate 18, height 5' 2"  (1.575 m), weight 195 lb 12.8 oz (88.814 kg). Patient is alert and in no acute distress. Conjunctiva is pink. Sclera is nonicteric Oropharyngeal mucosa is normal. No neck masses or thyromegaly noted. Cardiac exam with regular rhythm normal S1 and S2. No murmur or gallop noted. Lungs are clear to auscultation. Abdomen. Bowel sounds are normal. Abdomen is soft with mild tenderness at LLQ on deep palpation. No organomegaly or masses the  No LE edema or clubbing noted.  Labs/studies Results: CBC from this morning. WBC 8.7 H&H 14.2 and 39.1 and platelet count 185K Differential; neutrophils 27% lymphocytes 64% monos 7% and eos  2%   Assessment:  Ulcerative colitis appears to be in remission. Diarrhea appears to be due to IBS. She is not taking fiber supplement as recommended and she should try it  for a couple of months. She can also take Imodium or loperamide on daily basis. Lymphocytosis. She is followed by Dr. Tressie Stalker and associates   Plan:  Will continue with infliximab for maintenance as long as it is working and she has no side effects. Patient can take loperamide 2-4 mg by mouth every morning. She will try taking Metamucil 3-4 g by mouth daily. Office visit in 4 months.

## 2012-10-12 NOTE — Patient Instructions (Signed)
Can take loperamide or Imodium OTC 2-4 mg by mouth every morning. Metamucil 3-4 g by mouth daily at bedtime

## 2012-11-11 ENCOUNTER — Other Ambulatory Visit (HOSPITAL_COMMUNITY): Payer: Medicare Other

## 2012-11-16 ENCOUNTER — Encounter (HOSPITAL_COMMUNITY): Payer: Medicare Other

## 2012-11-16 ENCOUNTER — Encounter (HOSPITAL_COMMUNITY): Payer: Medicare Other | Attending: Internal Medicine

## 2012-11-16 VITALS — BP 160/85 | HR 74 | Temp 97.9°F | Resp 20 | Wt 193.0 lb

## 2012-11-16 DIAGNOSIS — D72820 Lymphocytosis (symptomatic): Secondary | ICD-10-CM | POA: Insufficient documentation

## 2012-11-16 DIAGNOSIS — K5190 Ulcerative colitis, unspecified, without complications: Secondary | ICD-10-CM

## 2012-11-16 MED ORDER — SODIUM CHLORIDE 0.9 % IV SOLN
5.0000 mg/kg | Freq: Once | INTRAVENOUS | Status: AC
  Administered 2012-11-16: 400 mg via INTRAVENOUS
  Filled 2012-11-16: qty 40

## 2012-11-16 MED ORDER — SODIUM CHLORIDE 0.9 % IJ SOLN
10.0000 mL | INTRAMUSCULAR | Status: DC | PRN
  Administered 2012-11-16: 10 mL via INTRAVENOUS

## 2012-11-16 MED ORDER — SODIUM CHLORIDE 0.9 % IV SOLN
INTRAVENOUS | Status: DC
  Administered 2012-11-16: 12:00:00 via INTRAVENOUS

## 2012-11-16 NOTE — Progress Notes (Signed)
Tolerated infusion well.

## 2012-11-19 ENCOUNTER — Other Ambulatory Visit: Payer: Self-pay

## 2012-11-19 ENCOUNTER — Encounter (INDEPENDENT_AMBULATORY_CARE_PROVIDER_SITE_OTHER): Payer: Self-pay

## 2012-11-19 MED ORDER — "INSULIN SYRINGE 31G X 5/16"" 0.5 ML MISC": Status: DC

## 2012-11-20 ENCOUNTER — Other Ambulatory Visit: Payer: Self-pay | Admitting: *Deleted

## 2012-11-23 ENCOUNTER — Other Ambulatory Visit: Payer: Self-pay | Admitting: *Deleted

## 2012-11-23 MED ORDER — ONETOUCH LANCETS MISC: Status: DC

## 2012-12-01 ENCOUNTER — Ambulatory Visit: Payer: Medicare Other | Admitting: Endocrinology

## 2012-12-07 ENCOUNTER — Ambulatory Visit (INDEPENDENT_AMBULATORY_CARE_PROVIDER_SITE_OTHER): Payer: Medicare Other | Admitting: Endocrinology

## 2012-12-07 ENCOUNTER — Encounter (HOSPITAL_COMMUNITY): Payer: Medicare Other | Attending: Internal Medicine

## 2012-12-07 ENCOUNTER — Encounter: Payer: Self-pay | Admitting: Endocrinology

## 2012-12-07 VITALS — BP 128/80 | HR 78 | Wt 195.0 lb

## 2012-12-07 DIAGNOSIS — IMO0001 Reserved for inherently not codable concepts without codable children: Secondary | ICD-10-CM

## 2012-12-07 DIAGNOSIS — D696 Thrombocytopenia, unspecified: Secondary | ICD-10-CM | POA: Diagnosis not present

## 2012-12-07 DIAGNOSIS — D72820 Lymphocytosis (symptomatic): Secondary | ICD-10-CM | POA: Diagnosis not present

## 2012-12-07 DIAGNOSIS — E1165 Type 2 diabetes mellitus with hyperglycemia: Secondary | ICD-10-CM

## 2012-12-07 LAB — HEMOGLOBIN A1C: Hgb A1c MFr Bld: 6.7 % — ABNORMAL HIGH (ref 4.6–6.5)

## 2012-12-07 LAB — CBC WITH DIFFERENTIAL/PLATELET
Basophils Absolute: 0 10*3/uL (ref 0.0–0.1)
Lymphocytes Relative: 67 % — ABNORMAL HIGH (ref 12–46)
Lymphs Abs: 5 10*3/uL — ABNORMAL HIGH (ref 0.7–4.0)
Neutro Abs: 1.8 10*3/uL (ref 1.7–7.7)
Neutrophils Relative %: 24 % — ABNORMAL LOW (ref 43–77)
Platelets: 173 10*3/uL (ref 150–400)
RBC: 4.35 MIL/uL (ref 3.87–5.11)
RDW: 12.5 % (ref 11.5–15.5)
WBC: 7.5 10*3/uL (ref 4.0–10.5)

## 2012-12-07 LAB — MICROALBUMIN / CREATININE URINE RATIO
Creatinine,U: 43.7 mg/dL
Microalb Creat Ratio: 1.4 mg/g (ref 0.0–30.0)
Microalb, Ur: 0.6 mg/dL (ref 0.0–1.9)

## 2012-12-07 MED ORDER — "INSULIN SYRINGE 31G X 5/16"" 0.5 ML MISC": Status: DC

## 2012-12-07 MED ORDER — INSULIN REGULAR HUMAN 100 UNIT/ML IJ SOLN: 40.0000 [IU] | Freq: Three times a day (TID) | INTRAMUSCULAR | Status: DC

## 2012-12-07 NOTE — Progress Notes (Signed)
Subjective:    Patient ID: [name], female    DOB: Apr 25, 1946, 67 y.o.   MRN: [id number]  HPI Pt returns for f/u of type 2 DM (dx'ed 7829; complicated by peripheral sensory neuropathy; she takes reg insulin due to cost of meds; she has never had an episode of severe hypoglycemia).  she brings a record of her cbg's which i have reviewed today.  It varies from 71 to 218.  It is in general higher as the day goes on.   Past Medical History  Diagnosis Date  . MENOPAUSAL DISORDER   . REDUCTION MAMMOPLASTY, HX OF 1994    bilateral  . ULCERATIVE COLITIS   . HYPERTENSION   . GERD   . DIABETES MELLITUS, TYPE II   . Thrombocytopenia 01/15/2012    presumably d/t metronidazole  . Rotator cuff tear     bilateral    Past Surgical History  Procedure Laterality Date  . Abdominal hysterectomy  1980    total, with bladder tack  . Breast surgery  1994    Bilateral, due to fibrocystic breast changes  . Rectocele repair  1990  . Carpal tunnel release  1996    Right  . Right knee arthroscopy  01/1999    then again 10/2000  . L4-l5 diskectomy  08/1999  . Cholecystectomy    . Flexible sigmoidoscopy  01/17/2012    Procedure: FLEXIBLE SIGMOIDOSCOPY;  Surgeon: Rogene Houston, MD;  Location: [facility];  Service: Endoscopy;  Laterality: N/A;    History   Social History  . Marital Status: Married    Spouse Name: N/A    Number of Children: N/A  . Years of Education: N/A   Occupational History  . Not on file.   Social History Main Topics  . Smoking status: Never Smoker   . 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 Alcohol Use: Yes     Comment: Rare  . Drug Use: No  . Sexually Active: Not on file   Other Topics Concern  . Not on file   Social History Narrative  . No narrative on file    Current Outpatient Prescriptions on File  Prior to Visit  Medication Sig Dispense Refill  . ALPRAZolam (XANAX) 0.5 MG tablet TAKE 1 TABLET AT BEDTIME AS NEEDED FOR SLEEP  30 tablet  2  . diphenhydrAMINE (BENADRYL) 25 MG tablet Take 25 mg by mouth at bedtime.      Marland Kitchen estradiol (ESTRACE) 1 MG tablet Take 1 mg by mouth daily.        [name] (FLORA-Q) CAPS Take 1 capsule by mouth daily.  60 capsule  1  . furosemide (LASIX) 40 MG tablet TAKE 1 TABLET (40 MG TOTAL) BY MOUTH 4 (FOUR) TIMES A WEEK.  30 tablet  0  . hydrochlorothiazide (HYDRODIURIL) 50 MG tablet Take 50 mg by mouth 3 (three) times a week. Takes on days she doesn't take furosemide      . inFLIXimab (REMICADE) 100 MG injection Inject into the vein.      Marland Kitchen loperamide (IMODIUM) 2 MG capsule Take 2 mg by mouth 3 (three) times daily as needed.      . metoprolol (TOPROL-XL) 100 MG 24 hr tablet 1 tablet by mouth every morning, 1/2 tablet every evening       . NEXIUM 40 MG capsule  daily. Patient states that she is taking only 1 a day      . ONE TOUCH LANCETS MISC PATIENT USES ONE TOUCH DELICAL 78M LANCETS. USE AS DIRECTED TWICE DAILY'; DIAGNOSIS CODE 250.02  200 each  6  . psyllium (METAMUCIL SMOOTH TEXTURE) 28 % packet Take 1 packet by mouth at bedtime.  30 packet  3  . traMADol (ULTRAM) 50 MG tablet Take 50 mg by mouth as needed.       . venlafaxine XR (EFFEXOR XR) 75 MG 24 hr capsule Take 1 capsule (75 mg total) by mouth daily.  30 capsule  5  . [DISCONTINUED] amitriptyline (ELAVIL) 25 MG tablet Take 1 tablet (25 mg total) by mouth at bedtime. start with 1/2 tab at night for 1 week then increase to 1 whole tab.  30 tablet  3  . [DISCONTINUED] bromocriptine (PARLODEL) 2.5 MG tablet Take 2.5 mg by mouth 2 (two) times daily.         No current facility-administered medications on file prior to visit.    Allergies  Allergen Reactions  . Percocet (Oxycodone-Acetaminophen) Itching  . Actos (Pioglitazone)     Edema   . Benzocaine-Menthol   . Colesevelam     REACTION: gi sxs  .  Flagyl (Metronidazole Hcl)     Caused thrombocytopenia.  . Metformin And Related Diarrhea  . Nisoldipine   . Oxycodone-Acetaminophen     REACTION: Itch  . Prilosec (Omeprazole)     Family History  Problem Relation Age of Onset  . Diabetes Mother   . Hypertension Mother   . Heart attack Father     Mid 69's  . Heart disease Father   . Lung disease Father     spot on lung; had lung surgery  . Alcohol abuse Other   . Hypertension Son   . Diabetes Son     BP 128/80  Pulse 78  Wt 195 lb (88.451 kg)  BMI 35.66 kg/m2  SpO2 96%    Review of Systems denies hypoglycemia.      Objective:   Physical Exam VITAL SIGNS:  See vs page GENERAL: no distress Pulses: dorsalis pedis intact bilat.   Feet: no deformity.  no ulcer on the feet.  feet are of normal color and temp.  Trace bilat leg edema Neuro: sensation is intact to touch on the feet.    Lab Results  Component Value Date   HGBA1C 6.7* 12/07/2012      Assessment & Plan:  DM: well-controlled

## 2012-12-07 NOTE — Patient Instructions (Addendum)
Please continue the same insulin. check your blood sugar twice a day.  vary the time of day when you check, between before the 3 meals, and at bedtime.  also check if you have symptoms of your blood sugar being too high or too low.  please keep a record of the readings and bring it to your next appointment here.  please call us sooner if your blood sugar goes below 70, or if it stays over 200.   Please come back for a follow-up appointment in 3 months.   blood tests are being requested for you today.  We'll contact you with results.

## 2012-12-07 NOTE — Progress Notes (Signed)
Labs drawn today for cbc/diff

## 2012-12-09 ENCOUNTER — Other Ambulatory Visit (HOSPITAL_COMMUNITY): Payer: Medicare Other

## 2012-12-11 ENCOUNTER — Encounter (HOSPITAL_COMMUNITY): Payer: Self-pay | Admitting: Oncology

## 2012-12-11 ENCOUNTER — Encounter (HOSPITAL_BASED_OUTPATIENT_CLINIC_OR_DEPARTMENT_OTHER): Payer: Medicare Other | Admitting: Oncology

## 2012-12-11 VITALS — BP 175/85 | HR 76 | Temp 98.3°F | Resp 18 | Wt 199.3 lb

## 2012-12-11 DIAGNOSIS — D72820 Lymphocytosis (symptomatic): Secondary | ICD-10-CM

## 2012-12-11 DIAGNOSIS — K5190 Ulcerative colitis, unspecified, without complications: Secondary | ICD-10-CM

## 2012-12-11 DIAGNOSIS — D696 Thrombocytopenia, unspecified: Secondary | ICD-10-CM

## 2012-12-11 NOTE — Progress Notes (Signed)
#  1 lymphocytosis, with fluctuations between 1300 to6,000 over the last 2 years. Peripheral blood smear does not reveal significant abnormalities of her lymphocytes etc. I suspect this is reactive to her ulcerative colitis but she will be followed. #2 ulcerative colitis times many years still with many accidents, diarrhea, bloating, gas, and limitation of ability to be fully active. #3 degenerative arthritis of the knees status post 2 procedures #4 back surgery x1 for degenerative disc and arthritic disease #5 obesity  She is here today for followup of her leukocytosis. I do not think it is a serious issue at this time. She does need to be followed however. She still does not have any B. symptomatology. Unfortunately she continues to gain weight. We have offered her a dietitian consultation. She cannot be physically active it appears very easily.  On exam there still remains no adenopathy in the cervical, supraclavicular, infraclavicular, axillary, epitrochlear, or inguinal areas. Her lungs are clear today. She remains slightly pale in appearance but it is minimal. Heart shows a regular rhythm and rate without murmur rub or gallop. Abdomen is quite presently without hepatosplenomegaly. It is obese. She has no leg edema. She has no arm edema. She is alert and oriented.  We will see her in 4 months with a repeat CBC and differential but I tried to reassure her that nothing further needs to be done other than followup.

## 2012-12-11 NOTE — Patient Instructions (Addendum)
[facility] Discharge Instructions  RECOMMENDATIONS MADE BY THE CONSULTANT AND ANY TEST RESULTS WILL BE SENT TO YOUR REFERRING PHYSICIAN.  EXAM FINDINGS BY THE PHYSICIAN TODAY AND SIGNS OR SYMPTOMS TO REPORT TO CLINIC OR PRIMARY PHYSICIAN:   Blood work in 4 months  Return in 4 months to see doctor  You can meet with a nutritionist if you would like. We can set this up.    Thank you for choosing [facility] to provide your oncology and hematology care.  To afford each patient quality time with our providers, please arrive at least 15 minutes before your scheduled appointment time.  With your help, our goal is to use those 15 minutes to complete the necessary work-up to ensure our physicians have the information they need to help with your evaluation and healthcare recommendations.    Effective January 1st, 2014, we ask that you re-schedule your appointment with our physicians should you arrive 10 or more minutes late for your appointment.  We strive to give you quality time with our providers, and arriving late affects you and other patients whose appointments are after yours.    Again, thank you for choosing [facility]  Our hope is that these requests will decrease the amount of time that you wait before being seen by our physicians.       _____________________________________________________________  Should you have questions after your visit to [facility], please contact our office at [phone] between the hours of 8:30 a.m. and 5:00 p.m.  Voicemails left after 4:30 p.m. will not be returned until the following business day.  For prescription refill requests, have your pharmacy contact our office with your prescription refill request.

## 2012-12-14 ENCOUNTER — Ambulatory Visit (HOSPITAL_COMMUNITY): Payer: Medicare Other | Admitting: Oncology

## 2013-01-08 DIAGNOSIS — M5020 Other cervical disc displacement, unspecified cervical region: Secondary | ICD-10-CM | POA: Diagnosis not present

## 2013-01-11 ENCOUNTER — Encounter (HOSPITAL_COMMUNITY): Payer: Medicare Other | Attending: Internal Medicine

## 2013-01-11 VITALS — BP 151/80 | HR 69 | Temp 97.2°F | Resp 16 | Wt 181.0 lb

## 2013-01-11 DIAGNOSIS — D72820 Lymphocytosis (symptomatic): Secondary | ICD-10-CM | POA: Diagnosis not present

## 2013-01-11 DIAGNOSIS — D696 Thrombocytopenia, unspecified: Secondary | ICD-10-CM | POA: Insufficient documentation

## 2013-01-11 DIAGNOSIS — K5190 Ulcerative colitis, unspecified, without complications: Secondary | ICD-10-CM

## 2013-01-11 MED ORDER — SODIUM CHLORIDE 0.9 % IV SOLN
INTRAVENOUS | Status: DC
  Administered 2013-01-11: 12:00:00 via INTRAVENOUS

## 2013-01-11 MED ORDER — SODIUM CHLORIDE 0.9 % IJ SOLN
10.0000 mL | INTRAMUSCULAR | Status: DC | PRN
  Administered 2013-01-11: 10 mL via INTRAVENOUS

## 2013-01-11 MED ORDER — SODIUM CHLORIDE 0.9 % IV SOLN
5.0000 mg/kg | Freq: Once | INTRAVENOUS | Status: AC
  Administered 2013-01-11: 400 mg via INTRAVENOUS
  Filled 2013-01-11: qty 40

## 2013-01-11 NOTE — Progress Notes (Signed)
Tolerated remicade infusion well.

## 2013-01-13 ENCOUNTER — Telehealth (INDEPENDENT_AMBULATORY_CARE_PROVIDER_SITE_OTHER): Payer: Self-pay | Admitting: *Deleted

## 2013-01-13 NOTE — Telephone Encounter (Signed)
[name] said her Rx for Nexium has ran out with the mail order company. She need a Rx called in at [facility] [place]. Her return phone number is [phone].

## 2013-01-13 NOTE — Telephone Encounter (Signed)
Patient was called and she ask that we call in her Nexium 40 mg, she takes 1 by mouth twice a day. She said that another Doctor filled it for her the last time This [facility] was [facility] The patient per her last office visit is to see [place] again in May 2014

## 2013-01-14 NOTE — Telephone Encounter (Signed)
[name] states that she has this message to make the patient an appointment for May.

## 2013-01-14 NOTE — Telephone Encounter (Signed)
Apt has been scheduled for 02/09/13 with Dr. Laural [name].

## 2013-01-23 ENCOUNTER — Other Ambulatory Visit: Payer: Self-pay | Admitting: Internal Medicine

## 2013-01-25 NOTE — Telephone Encounter (Signed)
[facility]...lmb

## 2013-02-09 ENCOUNTER — Ambulatory Visit (INDEPENDENT_AMBULATORY_CARE_PROVIDER_SITE_OTHER): Payer: Medicare Other | Admitting: Internal Medicine

## 2013-02-09 ENCOUNTER — Encounter (INDEPENDENT_AMBULATORY_CARE_PROVIDER_SITE_OTHER): Payer: Self-pay | Admitting: Internal Medicine

## 2013-02-09 VITALS — BP 128/70 | HR 78 | Temp 98.7°F | Resp 20 | Ht 61.0 in | Wt 194.4 lb

## 2013-02-09 DIAGNOSIS — K5190 Ulcerative colitis, unspecified, without complications: Secondary | ICD-10-CM | POA: Diagnosis not present

## 2013-02-09 DIAGNOSIS — K219 Gastro-esophageal reflux disease without esophagitis: Secondary | ICD-10-CM

## 2013-02-09 DIAGNOSIS — R609 Edema, unspecified: Secondary | ICD-10-CM | POA: Diagnosis not present

## 2013-02-09 DIAGNOSIS — R600 Localized edema: Secondary | ICD-10-CM

## 2013-02-09 LAB — C-REACTIVE PROTEIN: CRP: <0.5 mg/dL (ref <0.60)

## 2013-02-09 LAB — BASIC METABOLIC PANEL
BUN: 13 mg/dL (ref 6–23)
Calcium: 9.4 mg/dL (ref 8.4–10.5)
Glucose, Bld: 98 mg/dL (ref 70–99)
Sodium: 137 mEq/L (ref 135–145)

## 2013-02-09 MED ORDER — POTASSIUM CHLORIDE ER 10 MEQ PO TBCR: 10.0000 meq | EXTENDED_RELEASE_TABLET | Freq: Every day | ORAL | Status: DC

## 2013-02-09 MED ORDER — FUROSEMIDE 40 MG PO TABS: 40.0000 mg | ORAL_TABLET | Freq: Every day | ORAL | Status: DC

## 2013-02-09 MED ORDER — POTASSIUM CHLORIDE ER 10 MEQ PO TBCR: 20.0000 meq | EXTENDED_RELEASE_TABLET | Freq: Every day | ORAL | Status: DC

## 2013-02-09 MED ORDER — ESOMEPRAZOLE MAGNESIUM 40 MG PO CPDR: 40.0000 mg | DELAYED_RELEASE_CAPSULE | Freq: Every day | ORAL | Status: DC

## 2013-02-09 NOTE — Progress Notes (Signed)
Presenting complaint;  Followup for ulcerative colitis.  Subjective:  Patient is 67 year old Caucasian female with history of pan ulcerative colitis who presents for scheduled visit. She was last seen 4 months ago. She is doing better. She's not having one to 3 formed stools daily. She has dropped Imodium dose to 2 tablets a day. She denies melena or rectal bleeding. She has at times difficulty evacuating which is felt to be due to rectocele. She's had this repaired but has relapsed. Her appetite is good. She is on low carb diet and trying to lose weight. She remains with pain in small joints of hands. She also complains of weakness in both hands and this in the evaluated for recurrent carpal tunnel. She's also having neck pain due to bulging disc and schedule for nerve block later this week. She has occasional lower abdominal cramps with certain foods. She continues to complain of lower extremity edema. Her next appointment with Dr. Tressie Stalker is in July 2014 and she'll have blood work prior to that visit.  Current Medications: Current Outpatient Prescriptions  Medication Sig Dispense Refill  . estradiol (ESTRACE) 1 MG tablet Take 1 mg by mouth daily.        [name] (FLORA-Q) CAPS Take 1 capsule by mouth daily.  60 capsule  1  . furosemide (LASIX) 40 MG tablet TAKE 1 TABLET (40 MG TOTAL) BY MOUTH 4 (FOUR) TIMES A WEEK.  30 tablet  0  . hydrochlorothiazide (HYDRODIURIL) 50 MG tablet Take 50 mg by mouth 3 (three) times a week. Takes on days she doesn't take furosemide      . insulin regular (NOVOLIN R,HUMULIN R) 100 units/mL injection Inject 0.4 mLs (40 Units total) into the skin 3 (three) times daily before meals.  40 mL  11  . Insulin Syringe-Needle U-100 (INSULIN SYRINGE .5CC/31GX5/16") 31G X 5/16" 0.5 ML MISC Use as directed three times daily dx 250.02  100 each  5  . loperamide (IMODIUM) 2 MG capsule Take 2 mg by mouth 2 (two) times daily.       . metoprolol (TOPROL-XL) 100 MG 24 hr tablet  Take 100 mg by mouth 2 (two) times daily.       Marland Kitchen NEXIUM 40 MG capsule daily. Patient states that she is taking only 1 a day      . ONE TOUCH LANCETS MISC PATIENT USES ONE TOUCH DELICAL 82N LANCETS. USE AS DIRECTED TWICE DAILY'; DIAGNOSIS CODE 250.02  200 each  6  . ONETOUCH DELICA LANCETS MISC And lancets, twice daily dx 250.02      . venlafaxine XR (EFFEXOR XR) 75 MG 24 hr capsule Take 1 capsule (75 mg total) by mouth daily.  30 capsule  5  . ALPRAZolam (XANAX) 0.5 MG tablet TAKE 1 TABLET BY MOUTH AT BEDTIME AS NEEDED FOR SLEEP  30 tablet  3  . inFLIXimab (REMICADE) 100 MG injection Inject into the vein every 8 (eight) weeks.       . [DISCONTINUED] amitriptyline (ELAVIL) 25 MG tablet Take 1 tablet (25 mg total) by mouth at bedtime. start with 1/2 tab at night for 1 week then increase to 1 whole tab.  30 tablet  3  . [DISCONTINUED] bromocriptine (PARLODEL) 2.5 MG tablet Take 2.5 mg by mouth 2 (two) times daily.         No current facility-administered medications for this visit.     Objective: Blood pressure 128/70, pulse 78, temperature 98.7 F (37.1 C), temperature source Oral, resp. rate 20,  height 5' 1"  (1.549 m), weight 194 lb 6.4 oz (88.179 kg). Patient is alert and in no acute distress. Conjunctiva is pink. Sclera is nonicteric Oropharyngeal mucosa is normal. No neck masses or thyromegaly noted. Cardiac exam with regular rhythm normal S1 and S2. No murmur or gallop noted. Lungs are clear to auscultation. Abdomen is full but soft and nontender without organomegaly or masses. She has 1+ pitting edema involving her legs.  Labs/studies Results: CBC from 12/07/2012 revealed WBC of 7.5 H&H of 14 and 40.5, platelet count 173K and differential reveals 67% lymphocytes. 1C on same date was 6.7  Assessment:  #1. Ulcerative colitis. She appears to be in remission or close to it on Remicade or infliximab. #2. GERD. Symptoms well controlled with PPI. #3. LE edema. #4. Lymphocytosis. She  has  followup visit with  Dr. Tressie Stalker in July 2014. If Dr. Tressie Stalker feels she should be off Remicade he'll do so.   Plan:  Discontinue HydroDIURIL. Take furosemide 40 mg by mouth every morning. KCl 20 mg by mouth daily. CRP and metabolic 7. Office visit in 6 months.

## 2013-02-09 NOTE — Patient Instructions (Signed)
Physician will contact you with results of blood work.

## 2013-02-10 DIAGNOSIS — M5020 Other cervical disc displacement, unspecified cervical region: Secondary | ICD-10-CM | POA: Diagnosis not present

## 2013-02-11 NOTE — Progress Notes (Signed)
I called the pharmacy in [place] and they confirmed that they did rec'v the e- prescription.

## 2013-02-15 ENCOUNTER — Ambulatory Visit: Payer: Medicare Other | Admitting: Internal Medicine

## 2013-02-17 ENCOUNTER — Ambulatory Visit (INDEPENDENT_AMBULATORY_CARE_PROVIDER_SITE_OTHER): Payer: Medicare Other | Admitting: Internal Medicine

## 2013-02-17 ENCOUNTER — Encounter: Payer: Self-pay | Admitting: Internal Medicine

## 2013-02-17 VITALS — BP 142/84 | HR 81 | Temp 97.9°F | Wt 194.1 lb

## 2013-02-17 DIAGNOSIS — E1149 Type 2 diabetes mellitus with other diabetic neurological complication: Secondary | ICD-10-CM

## 2013-02-17 DIAGNOSIS — R42 Dizziness and giddiness: Secondary | ICD-10-CM

## 2013-02-17 DIAGNOSIS — E1142 Type 2 diabetes mellitus with diabetic polyneuropathy: Secondary | ICD-10-CM | POA: Diagnosis not present

## 2013-02-17 DIAGNOSIS — F411 Generalized anxiety disorder: Secondary | ICD-10-CM | POA: Diagnosis not present

## 2013-02-17 DIAGNOSIS — K5190 Ulcerative colitis, unspecified, without complications: Secondary | ICD-10-CM | POA: Diagnosis not present

## 2013-02-17 DIAGNOSIS — K219 Gastro-esophageal reflux disease without esophagitis: Secondary | ICD-10-CM

## 2013-02-17 DIAGNOSIS — F419 Anxiety disorder, unspecified: Secondary | ICD-10-CM

## 2013-02-17 MED ORDER — METOPROLOL SUCCINATE ER 100 MG PO TB24: 100.0000 mg | ORAL_TABLET | Freq: Two times a day (BID) | ORAL | Status: DC

## 2013-02-17 MED ORDER — BUSPIRONE HCL 5 MG PO TABS: 5.0000 mg | ORAL_TABLET | Freq: Two times a day (BID) | ORAL | Status: DC | PRN

## 2013-02-17 MED ORDER — PROMETHAZINE HCL 12.5 MG PO TABS: 12.5000 mg | ORAL_TABLET | Freq: Three times a day (TID) | ORAL | Status: DC | PRN

## 2013-02-17 MED ORDER — ESOMEPRAZOLE MAGNESIUM 40 MG PO CPDR: 40.0000 mg | DELAYED_RELEASE_CAPSULE | Freq: Two times a day (BID) | ORAL | Status: DC

## 2013-02-17 MED ORDER — MECLIZINE HCL 25 MG PO TABS: 25.0000 mg | ORAL_TABLET | Freq: Three times a day (TID) | ORAL | Status: DC | PRN

## 2013-02-17 MED ORDER — POTASSIUM CHLORIDE ER 10 MEQ PO TBCR: 20.0000 meq | EXTENDED_RELEASE_TABLET | Freq: Every day | ORAL | Status: DC

## 2013-02-17 MED ORDER — FUROSEMIDE 40 MG PO TABS: 40.0000 mg | ORAL_TABLET | Freq: Every day | ORAL | Status: DC

## 2013-02-17 NOTE — Progress Notes (Signed)
Subjective:    Patient ID: [name], female    DOB: Feb 15, 1946, 67 y.o.   MRN: [id number]  HPI  Here for follow up - reviewed chronic medical issues  Chronic neuropathy -ongoing feet numbness, bilateral - chronic issue for years - NCS 2007, repeated summer 2012 unremarkable by pt report (not available). Intol of amitrip, trazadone, effexor, cymbalta, and gabapentin   anxiety - started on pristiq by obg after poor tol of other meds - uses prn xanax but ?other non habit forming med - changed to generic alternative venlafaxine 07/2012  DM2 - follows with endo for same - dx 2010 - intol of oral meds -thus on insulin. she was unable to tolerate metformin (diarrhea), januvia (headache), byetta (abdominal bruising), actos (edema), onglyza, glipizide and bromocrip. states cbg's are well-controlled. pt says her diet and exercise are good.   hypertension - reports compliance with ongoing medical treatment and no changes in medication dose or frequency. denies adverse side effects related to current therapy. chronic LE edema, on furosemide  Dyslipidemia - intol of prior rx wth crestor, lipitor and zocor - poorly tolerated due to myalgias -   GERD - reports compliance with ongoing medical treatment and no changes in medication dose or frequency. denies adverse side effects related to current therapy.   Ulcerative colitis, chronic diarrhea associated with same - currently in remission on Remicade -no bleeding or abdominal pain symptoms at this time - follows with GI for same -  Also complains of vertigo x5 days. Describes as sensation of "pitching over"when turning her head. Denies falls, numbness change or vision change. Mild increase in chronic nausea related to same. No vomiting, no headache   Past Medical History  Diagnosis Date  . MENOPAUSAL DISORDER   . REDUCTION MAMMOPLASTY, HX OF 1994    bilateral  . ULCERATIVE COLITIS     remission on Remicade  . HYPERTENSION   . GERD   . DIABETES  MELLITUS, TYPE II   . Thrombocytopenia 01/15/2012    presumably d/t metronidazole  . Rotator cuff tear     bilateral  . Leukocytosis     presumably d/t UC     Review of Systems  Constitutional: Negative for fever and unexpected weight change.  Respiratory: Negative for cough and shortness of breath.   Cardiovascular: Negative for chest pain and palpitations.  Neurological: Negative for syncope and headaches.  Psychiatric/Behavioral: Positive for sleep disturbance. Negative for self-injury and dysphoric mood. The patient is not nervous/anxious.        Objective:   Physical Exam  BP 142/84  Pulse 81  Temp(Src) 97.9 F (36.6 C) (Oral)  Wt 194 lb 1.9 oz (88.052 kg)  BMI 36.7 kg/m2  SpO2 98% Wt Readings from Last 3 Encounters:  02/17/13 194 lb 1.9 oz (88.052 kg)  02/09/13 194 lb 6.4 oz (88.179 kg)  01/11/13 181 lb (82.101 kg)  Constitutional: She Is overweight, but appears well-developed and well-nourished. No distress.  HENT: Head: Normocephalic and atraumatic. Ears: B TMs ok, no erythema or effusion; Nose: Nose normal. Mouth/Throat: Oropharynx is clear and moist. No oropharyngeal exudate.  Eyes: Conjunctivae and EOM are normal. Pupils are equal, round, and reactive to light. No scleral icterus.  Neck: Normal range of motion. Neck supple. No JVD present. No thyromegaly present.  Cardiovascular: Normal rate, regular rhythm and normal heart sounds.  No murmur heard. No BLE edema. Pulmonary/Chest: Effort normal and breath sounds normal. No respiratory distress. She has no wheezes.  Neurological: She is  alert and oriented to person, place, and time. No cranial nerve deficit. Coordination, balance, strength, speech and gait are normal.  Psychiatric: She has a normal mood and affect. Her behavior is normal. Judgment and thought content normal.     Lab Results  Component Value Date   WBC 7.5 12/07/2012   HGB 14.0 12/07/2012   HCT 40.5 12/07/2012   PLT 173 12/07/2012   CHOL 175  01/24/2011   TRIG 538.0* 01/24/2011   HDL 40.30 01/24/2011   LDLDIRECT 69.5 01/24/2011   ALT 17 04/28/2012   AST 27 04/28/2012   NA 137 02/09/2013   K 3.7 02/09/2013   CL 96 02/09/2013   CREATININE 0.72 02/09/2013   BUN 13 02/09/2013   CO2 31 02/09/2013   TSH 2.080 04/28/2012   INR 1.25 01/14/2012   HGBA1C 6.7* 12/07/2012   MICROALBUR 0.6 12/07/2012       Assessment & Plan:  See problem list. Medications and labs reviewed today.  Vertigo, spouse with history of same 2 weeks ago, suspect viral labyrinthitis. Reassurance provided. Neuro exam benign. Symptomatic care with meclizine and or promethazine as needed; patient agrees to call symptoms worse or unimproved

## 2013-02-17 NOTE — Assessment & Plan Note (Signed)
Symptoms controlled on twice a day Nexium Refill provided, continue same

## 2013-02-17 NOTE — Assessment & Plan Note (Signed)
Follows with endo for same - poor tolerance of many oral medications but seems well controlled on current combination associated with neuropathy in feet - but intol of gabapentin and prior elavil trial   Lab Results  Component Value Date   HGBA1C 6.7* 12/07/2012

## 2013-02-17 NOTE — Patient Instructions (Signed)
It was good to see you today. We have reviewed your prior records including labs and tests today Medications reviewed and updated -Refill on medication(s) as discussed today to mail order. Try Buspar in place of Xanax as needed for nerves Also use meclizine as needed for dizziness and promethazine as needed for nausea These prescription(s) have been submitted to your local pharmacy. Please take as directed and contact our office if you believe you are having problem(s) with the medication(s). Please schedule followup in 6 months, call sooner if problems.

## 2013-02-17 NOTE — Assessment & Plan Note (Signed)
Chronic issues, exac by complicated deaths of father and father in law Intolerant of cymbalta, [name], elavil trials in past - 07/2012 changed pristiq to venlafaxine - doing well on same Prefers non-habit forming medication for anxiety as needed - will try low-dose BuSpar in place of xanax Support offered for emotional stressors ( father's death 2012-10-10, family dispute with sister in interim)

## 2013-02-17 NOTE — Assessment & Plan Note (Signed)
Follows with GI for same - Chronic diarrhea -uses over-the-counter Imodium 3 times a day plus when necessary for same Clinically in remission per GI note spring 2014, on Remicade Interval history reviewed, continue same

## 2013-03-08 ENCOUNTER — Encounter (HOSPITAL_COMMUNITY): Payer: Medicare Other | Attending: Internal Medicine

## 2013-03-08 ENCOUNTER — Other Ambulatory Visit: Payer: Self-pay | Admitting: Obstetrics & Gynecology

## 2013-03-08 VITALS — BP 161/73 | HR 65 | Temp 97.8°F

## 2013-03-08 DIAGNOSIS — K5190 Ulcerative colitis, unspecified, without complications: Secondary | ICD-10-CM | POA: Diagnosis not present

## 2013-03-08 DIAGNOSIS — Z139 Encounter for screening, unspecified: Secondary | ICD-10-CM

## 2013-03-08 MED ORDER — SODIUM CHLORIDE 0.9 % IV SOLN
400.0000 mg | Freq: Once | INTRAVENOUS | Status: AC
  Administered 2013-03-08: 400 mg via INTRAVENOUS
  Filled 2013-03-08: qty 40

## 2013-03-08 MED ORDER — SODIUM CHLORIDE 0.9 % IV SOLN
INTRAVENOUS | Status: DC
  Administered 2013-03-08: 12:00:00 via INTRAVENOUS

## 2013-03-08 NOTE — Progress Notes (Signed)
Tolerated remicade infusion well.  IV started by T.  New, RN in right hand.  Good blood return.

## 2013-03-09 ENCOUNTER — Ambulatory Visit (INDEPENDENT_AMBULATORY_CARE_PROVIDER_SITE_OTHER): Payer: Medicare Other | Admitting: Endocrinology

## 2013-03-09 ENCOUNTER — Encounter: Payer: Self-pay | Admitting: Endocrinology

## 2013-03-09 VITALS — BP 132/72 | HR 75 | Ht 60.0 in | Wt 196.0 lb

## 2013-03-09 DIAGNOSIS — E119 Type 2 diabetes mellitus without complications: Secondary | ICD-10-CM | POA: Diagnosis not present

## 2013-03-09 DIAGNOSIS — E1165 Type 2 diabetes mellitus with hyperglycemia: Secondary | ICD-10-CM

## 2013-03-09 DIAGNOSIS — H2510 Age-related nuclear cataract, unspecified eye: Secondary | ICD-10-CM | POA: Diagnosis not present

## 2013-03-09 DIAGNOSIS — H04129 Dry eye syndrome of unspecified lacrimal gland: Secondary | ICD-10-CM | POA: Diagnosis not present

## 2013-03-09 LAB — HM DIABETES EYE EXAM

## 2013-03-09 LAB — HEMOGLOBIN A1C: Hgb A1c MFr Bld: 7.1 % — ABNORMAL HIGH (ref 4.6–6.5)

## 2013-03-09 MED ORDER — INSULIN ASPART 100 UNIT/ML FLEXPEN: 40.0000 [IU] | PEN_INJECTOR | Freq: Three times a day (TID) | SUBCUTANEOUS | Status: DC

## 2013-03-09 NOTE — Patient Instructions (Addendum)
Please continue the same insulin. check your blood sugar twice a day.  vary the time of day when you check, between before the 3 meals, and at bedtime.  also check if you have symptoms of your blood sugar being too high or too low.  please keep a record of the readings and bring it to your next appointment here.  please call us sooner if your blood sugar goes below 70, or if it stays over 200.   Please come back for a follow-up appointment in 3 months.   blood tests are being requested for you today.  We'll contact you with results.

## 2013-03-09 NOTE — Progress Notes (Signed)
Subjective:    Patient ID: [name], female    DOB: 12-13-1945, 67 y.o.   MRN: [id number]  HPI Pt returns for f/u of type 2 DM (dx'ed 9485; complicated by sensory neuropathy of the lower extremities; no other associated complications; she has never had an episode of severe hypoglycemia).  she brings a record of her cbg's which i have reviewed today.  It varies from 70 to 318, but most are in the 100's.  It is in general higher as the day goes on, and after several steroid injections she has had.  She wants to change reg insulin to novolog. Past Medical History  Diagnosis Date  . MENOPAUSAL DISORDER   . REDUCTION MAMMOPLASTY, HX OF 1994    bilateral  . ULCERATIVE COLITIS     remission on Remicade  . HYPERTENSION   . GERD   . DIABETES MELLITUS, TYPE II   . Thrombocytopenia 01/15/2012    presumably d/t metronidazole  . Rotator cuff tear     bilateral  . Leukocytosis     presumably d/t UC    Past Surgical History  Procedure Laterality Date  . Abdominal hysterectomy  1980    total, with bladder tack  . Breast surgery  1994    Bilateral, due to fibrocystic breast changes  . Rectocele repair  1990  . Carpal tunnel release  1996    Right  . Right knee arthroscopy  01/1999    then again 10/2000  . L4-l5 diskectomy  08/1999  . Cholecystectomy    . Flexible sigmoidoscopy  01/17/2012    Procedure: FLEXIBLE SIGMOIDOSCOPY;  Surgeon: Rogene Houston, MD;  Location: [facility];  Service: Endoscopy;  Laterality: N/A;    History   Social History  . Marital Status: Married    Spouse Name: N/A    Number of Children: N/A  . Years of Education: N/A   Occupational History  . Not on file.   Social History Main Topics  . Smoking status: Never Smoker   . 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 Alcohol Use: Yes     Comment: Rare  .  Drug Use: No  . Sexually Active: Not on file   Other Topics Concern  . Not on file   Social History Narrative  . No narrative on file    Current Outpatient Prescriptions on File Prior to Visit  Medication Sig Dispense Refill  . ALPRAZolam (XANAX) 0.5 MG tablet TAKE 1 TABLET BY MOUTH AT BEDTIME AS NEEDED FOR SLEEP  30 tablet  3  . busPIRone (BUSPAR) 5 MG tablet Take 1 tablet (5 mg total) by mouth 2 (two) times daily as needed.  60 tablet  0  . esomeprazole (NEXIUM) 40 MG capsule Take 1 capsule (40 mg total) by mouth 2 (two) times daily.  180 capsule  2  . estradiol (ESTRACE) 1 MG tablet Take 1 mg by mouth daily.        [name] (FLORA-Q) CAPS Take 1 capsule by mouth daily.  60 capsule  1  . furosemide (LASIX) 40 MG tablet Take 1 tablet (40 mg total) by mouth daily.  90 tablet  1  . inFLIXimab (REMICADE) 100 MG injection Inject into the vein every 8 (eight) weeks.       . Insulin Syringe-Needle U-100 (INSULIN SYRINGE .5CC/31GX5/16") 31G X  5/16" 0.5 ML MISC Use as directed three times daily dx 250.02  100 each  5  . loperamide (IMODIUM) 2 MG capsule Take 2 mg by mouth 2 (two) times daily.       . meclizine (ANTIVERT) 25 MG tablet Take 1 tablet (25 mg total) by mouth 3 (three) times daily as needed for dizziness or nausea.  30 tablet  0  . metoprolol succinate (TOPROL-XL) 100 MG 24 hr tablet Take 1 tablet (100 mg total) by mouth 2 (two) times daily.  180 tablet  3  . ONE TOUCH LANCETS MISC PATIENT USES ONE TOUCH DELICAL 48G LANCETS. USE AS DIRECTED TWICE DAILY'; DIAGNOSIS CODE 250.02  200 each  6  . ONETOUCH DELICA LANCETS MISC And lancets, twice daily dx 250.02      . potassium chloride (K-DUR) 10 MEQ tablet Take 2 tablets (20 mEq total) by mouth daily.  90 tablet  1  . promethazine (PHENERGAN) 12.5 MG tablet Take 1 tablet (12.5 mg total) by mouth every 8 (eight) hours as needed for nausea.  20 tablet  0  . venlafaxine XR (EFFEXOR XR) 75 MG 24 hr capsule Take 1 capsule (75 mg total) by mouth  daily.  30 capsule  5  . [DISCONTINUED] amitriptyline (ELAVIL) 25 MG tablet Take 1 tablet (25 mg total) by mouth at bedtime. start with 1/2 tab at night for 1 week then increase to 1 whole tab.  30 tablet  3  . [DISCONTINUED] bromocriptine (PARLODEL) 2.5 MG tablet Take 2.5 mg by mouth 2 (two) times daily.         No current facility-administered medications on file prior to visit.    Allergies  Allergen Reactions  . Percocet (Oxycodone-Acetaminophen) Itching  . Actos (Pioglitazone)     Edema   . Benzocaine-Menthol   . Colesevelam     REACTION: gi sxs  . Flagyl (Metronidazole Hcl)     Caused thrombocytopenia.  . Metformin And Related Diarrhea  . Nisoldipine   . Oxycodone-Acetaminophen     REACTION: Itch  . Prilosec (Omeprazole)   . Statins     Family History  Problem Relation Age of Onset  . Diabetes Mother   . Hypertension Mother   . Heart attack Father     Mid 69's  . Heart disease Father   . Lung disease Father     spot on lung; had lung surgery  . Alcohol abuse Other   . Hypertension Son   . Diabetes Son    BP 132/72  Pulse 75  Ht 5' (1.524 m)  Wt 196 lb (88.905 kg)  BMI 38.28 kg/m2  SpO2 98%  Review of Systems denies hypoglycemia and weight change.    Objective:   Physical Exam VITAL SIGNS:  See vs page GENERAL: no distress  Lab Results  Component Value Date   HGBA1C 6.7* 12/07/2012      Assessment & Plan:  DM: This insulin regimen was chosen from multiple options, as it best matches her insulin to her changing requirements throughout the day.  The benefits of glycemic control must be weighed against the risks of hypoglycemia.

## 2013-03-11 ENCOUNTER — Ambulatory Visit (HOSPITAL_COMMUNITY)
Admission: RE | Admit: 2013-03-11 | Discharge: 2013-03-11 | Disposition: A | Discharge status: Home / Self Care | Payer: Medicare Other | Source: Ambulatory Visit | Attending: Obstetrics & Gynecology | Admitting: Obstetrics & Gynecology

## 2013-03-11 DIAGNOSIS — Z1231 Encounter for screening mammogram for malignant neoplasm of breast: Secondary | ICD-10-CM | POA: Insufficient documentation

## 2013-03-11 DIAGNOSIS — Z139 Encounter for screening, unspecified: Secondary | ICD-10-CM

## 2013-03-15 ENCOUNTER — Other Ambulatory Visit: Payer: Self-pay | Admitting: Internal Medicine

## 2013-03-16 ENCOUNTER — Encounter: Payer: Self-pay | Admitting: *Deleted

## 2013-03-16 ENCOUNTER — Other Ambulatory Visit: Payer: Self-pay | Admitting: Obstetrics & Gynecology

## 2013-03-16 DIAGNOSIS — R928 Other abnormal and inconclusive findings on diagnostic imaging of breast: Secondary | ICD-10-CM

## 2013-03-17 DIAGNOSIS — G5600 Carpal tunnel syndrome, unspecified upper limb: Secondary | ICD-10-CM | POA: Diagnosis not present

## 2013-03-18 ENCOUNTER — Encounter: Payer: Self-pay | Admitting: Obstetrics & Gynecology

## 2013-03-18 ENCOUNTER — Ambulatory Visit (INDEPENDENT_AMBULATORY_CARE_PROVIDER_SITE_OTHER): Payer: Medicare Other | Admitting: Obstetrics & Gynecology

## 2013-03-18 VITALS — BP 140/86 | HR 68 | Ht 61.0 in | Wt 194.0 lb

## 2013-03-18 DIAGNOSIS — Z01419 Encounter for gynecological examination (general) (routine) without abnormal findings: Secondary | ICD-10-CM | POA: Diagnosis not present

## 2013-03-18 DIAGNOSIS — Z124 Encounter for screening for malignant neoplasm of cervix: Secondary | ICD-10-CM | POA: Diagnosis not present

## 2013-03-18 MED ORDER — ESTRADIOL 1 MG PO TABS: 1.0000 mg | ORAL_TABLET | Freq: Every day | ORAL | Status: DC

## 2013-03-18 MED ORDER — VENLAFAXINE HCL ER 75 MG PO CP24: 75.0000 mg | ORAL_CAPSULE | Freq: Every day | ORAL | Status: DC

## 2013-03-18 NOTE — Patient Instructions (Addendum)

## 2013-03-18 NOTE — Addendum Note (Signed)
Addended by: Megan Salon on: 03/18/2013 12:58 PM   Modules accepted: Orders

## 2013-03-18 NOTE — Progress Notes (Addendum)
Patient ID: [name], female   DOB: May 08, 1946, 67 y.o.   MRN: [id number]  67 y.o. G1P1 MarriedCaucasianF here for annual exam.  Newly on Lantus.  Doing really well with this.  No vaginal bleeding.  Having carpel tunnel in the late summer.  Having lots of stress with family after death of father.  His will has caused lots of stress with family.    No LMP recorded. Patient has had a hysterectomy.          Sexually active: yes  The current method of family planning is status post hysterectomy.    Exercising: no  The patient does not participate in regular exercise at present. Smoker:  no  Health Maintenance: Pap:  05/2009 History of abnormal Pap:  no MMG:  02/2013, needs repeat images Colonoscopy:  2009, Dr. Melony Overly, negative BMD:   05/2009, -1.1 TDaP:  06/15/2009 Screening Labs: PCP, Hb today: PCP, Urine today: PCP   reports that she has never smoked. She has never used smokeless tobacco. She reports that  drinks alcohol. She reports that she does not use illicit drugs.  Past Medical History  Diagnosis Date  . MENOPAUSAL DISORDER   . REDUCTION MAMMOPLASTY, HX OF 1994    bilateral  . ULCERATIVE COLITIS     remission on Remicade  . HYPERTENSION   . GERD   . DIABETES MELLITUS, TYPE II   . Thrombocytopenia 01/15/2012    presumably d/t metronidazole  . Rotator cuff tear     bilateral  . Leukocytosis     presumably d/t UC    Past Surgical History  Procedure Laterality Date  . Abdominal hysterectomy  1980    total, with bladder tack  . Breast surgery  1994    Bilateral, due to fibrocystic breast changes  . Rectocele repair  1990  . Carpal tunnel release  1996    Right  . Right knee arthroscopy  01/1999    then again 10/2000  . L4-l5 diskectomy  08/1999  . Cholecystectomy    . Flexible sigmoidoscopy  01/17/2012    Procedure: FLEXIBLE SIGMOIDOSCOPY;  Surgeon: Rogene Houston, MD;  Location: [facility];  Service: Endoscopy;  Laterality: N/A;    Current Outpatient  Prescriptions  Medication Sig Dispense Refill  . ALPRAZolam (XANAX) 0.5 MG tablet TAKE 1 TABLET BY MOUTH AT BEDTIME AS NEEDED FOR SLEEP  30 tablet  3  . esomeprazole (NEXIUM) 40 MG capsule Take 1 capsule (40 mg total) by mouth 2 (two) times daily.  180 capsule  2  . estradiol (ESTRACE) 1 MG tablet Take 1 mg by mouth daily.        [name] (FLORA-Q) CAPS Take 1 capsule by mouth daily.  60 capsule  1  . furosemide (LASIX) 40 MG tablet Take 1 tablet (40 mg total) by mouth daily.  90 tablet  1  . inFLIXimab (REMICADE) 100 MG injection Inject into the vein every 8 (eight) weeks.       . insulin aspart (NOVOLOG) 100 unit/mL SOLN FlexPen Inject 45 Units into the skin 3 (three) times daily with meals. And pen needles 3/day      . loperamide (IMODIUM) 2 MG capsule Take 2 mg by mouth 2 (two) times daily.       . meclizine (ANTIVERT) 25 MG tablet Take 1 tablet (25 mg total) by mouth 3 (three) times daily as needed for dizziness or nausea.  30 tablet  0  . metoprolol succinate (TOPROL-XL) 100 MG  24 hr tablet Take 1 tablet (100 mg total) by mouth 2 (two) times daily.  180 tablet  3  . ONE TOUCH LANCETS MISC PATIENT USES ONE TOUCH DELICAL 26O LANCETS. USE AS DIRECTED TWICE DAILY'; DIAGNOSIS CODE 250.02  200 each  6  . ONETOUCH DELICA LANCETS MISC And lancets, twice daily dx 250.02      . potassium chloride (K-DUR) 10 MEQ tablet Take 2 tablets (20 mEq total) by mouth daily.  90 tablet  1  . promethazine (PHENERGAN) 12.5 MG tablet Take 1 tablet (12.5 mg total) by mouth every 8 (eight) hours as needed for nausea.  20 tablet  0  . venlafaxine XR (EFFEXOR-XR) 75 MG 24 hr capsule TAKE ONE CAPSULE BY MOUTH EVERY DAY  30 capsule  5  . [DISCONTINUED] amitriptyline (ELAVIL) 25 MG tablet Take 1 tablet (25 mg total) by mouth at bedtime. start with 1/2 tab at night for 1 week then increase to 1 whole tab.  30 tablet  3  . [DISCONTINUED] bromocriptine (PARLODEL) 2.5 MG tablet Take 2.5 mg by mouth 2 (two) times daily.          No current facility-administered medications for this visit.    Family History  Problem Relation Age of Onset  . Diabetes Mother   . Hypertension Mother   . Heart attack Father     Mid 18's  . Heart disease Father   . Lung disease Father     spot on lung; had lung surgery  . Alcohol abuse Other   . Hypertension Son   . Diabetes Son     ROS:  Pertinent items are noted in HPI.  Otherwise, a comprehensive ROS was negative.  Exam:   BP 140/86  Pulse 68  Ht 5' 1"  (1.549 m)  Wt 194 lb (87.998 kg)  BMI 36.67 kg/m2  Weight change: +7 lbs  Height: 5' 1"  (154.9 cm)  Ht Readings from Last 3 Encounters:  03/18/13 5' 1"  (1.549 m)  03/09/13 5' (1.524 m)  02/09/13 5' 1"  (1.549 m)    General appearance: alert, cooperative and appears stated age Head: Normocephalic, without obvious abnormality, atraumatic Neck: no adenopathy, supple, symmetrical, trachea midline and thyroid normal to inspection and palpation Lungs: clear to auscultation bilaterally Breasts: normal appearance, no masses or tenderness Heart: regular rate and rhythm Abdomen: soft, non-tender; bowel sounds normal; no masses,  no organomegaly Extremities: extremities normal, atraumatic, no cyanosis or edema Skin: Skin color, texture, turgor normal. No rashes or lesions Lymph nodes: Cervical, supraclavicular, and axillary nodes normal. No abnormal inguinal nodes palpated Neurologic: Grossly normal   Pelvic: External genitalia:  no lesions              Urethra:  normal appearing urethra with no masses, tenderness or lesions              Bartholins and Skenes: normal                 Vagina: normal appearing vagina with normal color and discharge, no lesions              Cervix: no lesions              Pap taken: no Bimanual Exam:  Uterus:  uterus absent              Adnexa: no mass, fullness, tenderness               Rectovaginal: Confirms  Anus:  normal sphincter tone, no lesions  A:  Well Woman with  normal exam H/o TAH On HRT.  Will cut in 1/2 and she will see how she does on lower dose.   Htn, obesity, Diabetes  P:   Mammogram yearly.  Pt knows about current abnormal and need for follow-up.  She states she will call to schedule and does not need [place] to do that for her. pap smear not indicated Labs every three months with PCP return annually or prn  An After Visit Summary was printed and given to the patient.

## 2013-03-22 ENCOUNTER — Encounter: Payer: Self-pay | Admitting: Internal Medicine

## 2013-03-24 ENCOUNTER — Ambulatory Visit (HOSPITAL_COMMUNITY)
Admission: RE | Admit: 2013-03-24 | Discharge: 2013-03-24 | Disposition: A | Discharge status: Home / Self Care | Payer: Medicare Other | Source: Ambulatory Visit | Attending: Obstetrics & Gynecology | Admitting: Obstetrics & Gynecology

## 2013-03-24 DIAGNOSIS — R928 Other abnormal and inconclusive findings on diagnostic imaging of breast: Secondary | ICD-10-CM | POA: Diagnosis not present

## 2013-03-30 DIAGNOSIS — G5601 Carpal tunnel syndrome, right upper limb: Secondary | ICD-10-CM

## 2013-03-30 HISTORY — DX: Carpal tunnel syndrome, right upper limb: G56.01

## 2013-04-05 ENCOUNTER — Encounter (HOSPITAL_COMMUNITY): Payer: Medicare Other | Attending: Internal Medicine

## 2013-04-05 DIAGNOSIS — D696 Thrombocytopenia, unspecified: Secondary | ICD-10-CM | POA: Insufficient documentation

## 2013-04-05 LAB — CBC WITH DIFFERENTIAL/PLATELET
Eosinophils Absolute: 0.2 10*3/uL (ref 0.0–0.7)
Hemoglobin: 13.7 g/dL (ref 12.0–15.0)
Lymphocytes Relative: 63 % — ABNORMAL HIGH (ref 12–46)
Lymphs Abs: 4.9 10*3/uL — ABNORMAL HIGH (ref 0.7–4.0)
MCH: 32.5 pg (ref 26.0–34.0)
Monocytes Relative: 8 % (ref 3–12)
Neutro Abs: 2.1 10*3/uL (ref 1.7–7.7)
Neutrophils Relative %: 27 % — ABNORMAL LOW (ref 43–77)
Platelets: 186 10*3/uL (ref 150–400)
RBC: 4.22 MIL/uL (ref 3.87–5.11)
WBC: 7.8 10*3/uL (ref 4.0–10.5)

## 2013-04-05 NOTE — Progress Notes (Signed)
Labs drawn today for cbc/diff

## 2013-04-06 ENCOUNTER — Encounter (HOSPITAL_BASED_OUTPATIENT_CLINIC_OR_DEPARTMENT_OTHER): Payer: Medicare Other

## 2013-04-06 ENCOUNTER — Encounter (HOSPITAL_COMMUNITY): Payer: Self-pay

## 2013-04-06 VITALS — BP 136/73 | HR 73 | Temp 98.0°F | Resp 14 | Wt 198.2 lb

## 2013-04-06 DIAGNOSIS — D72820 Lymphocytosis (symptomatic): Secondary | ICD-10-CM

## 2013-04-06 NOTE — Progress Notes (Signed)
History of present illness:  Patient returns to clinic today for laboratory work in routine evaluation. She continues to feel well and remains asymptomatic. She denies any fevers, night sweats, or weight loss. She has no neurologic complaints. She denies any chest pain or shortness of breath. She has had no recent illnesses. She denies any nausea, vomiting, constipation, or diarrhea. She has no urinary complaints. Patient feels well today and offers no specific complaints.   Review of systems: As per history of present illness, otherwise a 10 system review is negative.   General:  Well-developed, well-nourished, no acute distress. Mental status: Normal affect. Eyes: Anicteric sclera. Respiratory: Clear to auscultation bilaterally. Cardiovascular: Regular rate and rhythm, no rubs, murmurs, or gallops. Gastrointestinal: Soft, nontender, nondistended, normoactive bowel sounds. Musculoskeletal: No edema. Skin: No rashes or petechiae noted. Neurological: Alert, answering all questions appropriately. Cranial nerves grossly intact.   Assessment and plan:  1. Lymphocytosis: Patient's white blood cell count continues to be within normal limits. Her total lymphocyte count is elevated, but unchanged from previous. No intervention is needed at this time. Return to clinic in 4 months with repeat laboratory work and further evaluation. We'll also order a peripheral blood flow cytometry at that time for completeness. If flow cytometry is negative and her lymphocyte count remains unchanged, she could possibly be discharged from clinic. Patient expressed understanding and was in agreement with this plan.

## 2013-04-06 NOTE — Patient Instructions (Addendum)
[facility] Discharge Instructions  RECOMMENDATIONS MADE BY THE CONSULTANT AND ANY TEST RESULTS WILL BE SENT TO YOUR REFERRING PHYSICIAN.  EXAM FINDINGS BY THE PHYSICIAN TODAY AND SIGNS OR SYMPTOMS TO REPORT TO CLINIC OR PRIMARY PHYSICIAN: Discussion by Dr. Grayland Ormond.    MEDICATIONS PRESCRIBED:  none  INSTRUCTIONS GIVEN AND DISCUSSED: Report night sweats, fevers, recurring infections, etc.  SPECIAL INSTRUCTIONS/FOLLOW-UP: Will do Peripheral Flow Cytometry and CBC in 4 months and see you afterwards.  Thank you for choosing [facility] to provide your oncology and hematology care.  To afford each patient quality time with our providers, please arrive at least 15 minutes before your scheduled appointment time.  With your help, our goal is to use those 15 minutes to complete the necessary work-up to ensure our physicians have the information they need to help with your evaluation and healthcare recommendations.    Effective January 1st, 2014, we ask that you re-schedule your appointment with our physicians should you arrive 10 or more minutes late for your appointment.  We strive to give you quality time with our providers, and arriving late affects you and other patients whose appointments are after yours.    Again, thank you for choosing [facility].  Our hope is that these requests will decrease the amount of time that you wait before being seen by our physicians.       _____________________________________________________________  Should you have questions after your visit to [facility], please contact our office at [phone] between the hours of 8:30 a.m. and 5:00 p.m.  Voicemails left after 4:30 p.m. will not be returned until the following business day.  For prescription refill requests, have your pharmacy contact our office with your prescription refill request.

## 2013-04-09 ENCOUNTER — Other Ambulatory Visit: Payer: Self-pay | Admitting: *Deleted

## 2013-04-09 MED ORDER — INSULIN PEN NEEDLE 31G X 5 MM MISC: Status: DC

## 2013-04-12 ENCOUNTER — Other Ambulatory Visit (HOSPITAL_COMMUNITY): Payer: Medicare Other

## 2013-04-12 ENCOUNTER — Other Ambulatory Visit: Payer: Self-pay | Admitting: *Deleted

## 2013-04-12 MED ORDER — INSULIN ASPART 100 UNIT/ML FLEXPEN: 40.0000 [IU] | PEN_INJECTOR | Freq: Three times a day (TID) | SUBCUTANEOUS | Status: DC

## 2013-04-12 NOTE — Telephone Encounter (Signed)
Pt needs rx for enough of syringes to last 1 mth. Resending.

## 2013-04-27 ENCOUNTER — Encounter (HOSPITAL_BASED_OUTPATIENT_CLINIC_OR_DEPARTMENT_OTHER): Payer: Self-pay | Admitting: *Deleted

## 2013-04-27 ENCOUNTER — Other Ambulatory Visit: Payer: Self-pay | Admitting: Orthopedic Surgery

## 2013-04-27 NOTE — Pre-Procedure Instructions (Signed)
To have BMET and EKG done at [facility]

## 2013-05-03 ENCOUNTER — Encounter (HOSPITAL_COMMUNITY)
Admission: RE | Admit: 2013-05-03 | Discharge: 2013-05-03 | Disposition: A | Discharge status: Home / Self Care | Payer: Medicare Other | Source: Ambulatory Visit | Attending: Internal Medicine | Admitting: Internal Medicine

## 2013-05-03 ENCOUNTER — Other Ambulatory Visit: Payer: Self-pay

## 2013-05-03 ENCOUNTER — Ambulatory Visit (HOSPITAL_COMMUNITY): Payer: Medicare Other

## 2013-05-03 DIAGNOSIS — D696 Thrombocytopenia, unspecified: Secondary | ICD-10-CM | POA: Diagnosis not present

## 2013-05-03 LAB — BASIC METABOLIC PANEL
BUN: 12 mg/dL (ref 6–23)
Chloride: 96 mEq/L (ref 96–112)
GFR calc non Af Amer: >90 mL/min (ref >90)
Glucose, Bld: 184 mg/dL — ABNORMAL HIGH (ref 70–99)
Potassium: 4 mEq/L (ref 3.5–5.1)
Sodium: 135 mEq/L (ref 135–145)

## 2013-05-03 MED ORDER — SODIUM CHLORIDE 0.9 % IV SOLN
INTRAVENOUS | Status: DC
  Administered 2013-05-03: 1000 mL via INTRAVENOUS

## 2013-05-03 MED ORDER — SODIUM CHLORIDE 0.9 % IV SOLN
400.0000 mg | INTRAVENOUS | Status: DC
  Administered 2013-05-03: 400 mg via INTRAVENOUS
  Filled 2013-05-03: qty 40

## 2013-05-03 NOTE — Progress Notes (Signed)
Dr.Crews reviewed EKG - OK for surgery.

## 2013-05-03 NOTE — H&P (Signed)
[name] is an 67 y.o. female.   Chief Complaint: c/o chronic and progressive numbness and tingling of the right hand HPI: [name] is s/p bilateral carpal tunnel release surgery with our office in the late 1980's. Since that time she has developed type II diabetes. She is retired from the [employer]. She now reports numbness in all fingers, primarily in the atrial segments, thumb, index, long, ring and small fingers virtually every night. She had a nerve conduction study performed in October of 2013 by Dr. Nelva Bush which revealed prolonged distal sensory latencies bilaterally. Her motor conduction parameters right and left were normal. EMG of her right upper extremity revealed normal abductor pollicis brevis findings. She states that her hands feel swollen and tight at night. She has been living with type II diabetes for 6 years. For the past year she has been using insulin in addition to oral agents. She has been unable to tolerate Metformin due to GI symptoms. Her last hemoglobin A1C was 6.6. She states that her hemoglobin A1C has been under 7 for years.   Past Medical History  Diagnosis Date  . GERD   . PONV (postoperative nausea and vomiting)   . Osteoarthritis   . HYPERTENSION     under control with med., has been on med. x 30 yr.  . IDDM (insulin dependent diabetes mellitus)   . Ulcerative colitis     Remicade infusion Q 8 weeks  . History of thrombocytopenia 12/2011  . 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 Dental bridge present     lower    Past Surgical History  Procedure Laterality Date  . Abdominal hysterectomy  1980    partial  . Breast reduction surgery  1994  . Rectocele repair  1990; 09/12/2006  . Carpal tunnel release Right 1996  . Knee arthroscopy Right 01/1999; 10/2000  . Hemilaminotomy lumbar spine Bilateral 09/07/1999    L4-5  . Cholecystectomy    . Flexible sigmoidoscopy  01/17/2012    Procedure: FLEXIBLE SIGMOIDOSCOPY;   Surgeon: Rogene Houston, MD;  Location: [facility];  Service: Endoscopy;  Laterality: N/A;  . Bilateral salpingoophorectomy  02/10/2001  . Tarsal tunnel release  2002  . Ureterolysis Right 02/10/2001  . Lysis of adhesion  02/10/2001  . Carpal tunnel release Left 03/21/2003  . 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 Esophagogastroduodenoscopy (egd) with esophageal dilation  12/02/2005    Family History  Problem Relation Age of Onset  . Diabetes Mother   . Hypertension Mother   . Heart attack Father     Mid 48's  . Heart disease Father   . Lung disease Father     spot on lung; had lung surgery  . Alcohol abuse Other   . Hypertension Son   . Diabetes Son    Social History:  reports that she has never smoked. She has never used smokeless tobacco. She reports that she does not drink alcohol or use illicit drugs.  Allergies:  Allergies  Allergen Reactions  . Actos (Pioglitazone) Other (See Comments)    EDEMA   . Benzocaine-Menthol Swelling    SWELLING OF MOUTH  . Colesevelam Other (See Comments)    GI UPSET  . Flagyl (Metronidazole Hcl) Other (See Comments)    DIAPHORESIS  . Metformin And Related Diarrhea  . Omeprazole Swelling    SWELLING OF TONGUE AND THROAT  . Shrimp (Shellfish Allergy)  Itching    OF THROAT AND EARS  . Statins Other (See Comments)    HEART RACING  . Adhesive (Tape) Other (See Comments)    SKIN IRRITATION AND BRUISING  . Nisoldipine Itching  . Percocet (Oxycodone-Acetaminophen) Itching    No prescriptions prior to admission    No results found for this or any previous visit (from the past 48 hour(s)).  No results found.   Pertinent items are noted in HPI.  Height 5' 2"  (1.575 m), weight 81.647 kg (180 lb).  General appearance: alert Head: Normocephalic, without obvious abnormality Neck: supple, symmetrical, trachea midline Resp: clear to auscultation bilaterally Cardio: regular rate and rhythm GI: normal findings:  bowel sounds normal Extremities: Inspection of her hands reveals normal musculature. Her dermatoglyphics are preserved but her sweat patterns are diminished. She has a positive wrist flexion test at 45 seconds reproducing numbness in the thumb, index, and long finger bilaterally. She has a negative hyperflexion test at 1 minute. She has pain with motion of her cervical spine in all directions but no radicular signs.   I received a copy of Dr. Rolena Infante electrodiagnostic studies. These revealed moderate bilateral carpal tunnel syndrome in October 2013.   Pulses: 2+ and symmetric Skin: normal Neurologic: Grossly normal    Assessment/Plan Impression: Right CTS  Plan: TO the OR for right CTR.The procedure, risks,benefits and post-op course were discussed with the patient at length and they were in agreement with the plan.  DASNOIT,[name] 05/03/2013, 10:59 AM   H&P documentation: 05/04/2013  -History and Physical Reviewed  -Patient has been re-examined  -No change in the plan of care  Jennifer Sickle, MD

## 2013-05-04 ENCOUNTER — Encounter (HOSPITAL_BASED_OUTPATIENT_CLINIC_OR_DEPARTMENT_OTHER)
Admission: RE | Disposition: A | Discharge status: Home / Self Care | Payer: Self-pay | Source: Ambulatory Visit | Attending: Orthopedic Surgery

## 2013-05-04 ENCOUNTER — Encounter (HOSPITAL_BASED_OUTPATIENT_CLINIC_OR_DEPARTMENT_OTHER): Payer: Self-pay | Admitting: *Deleted

## 2013-05-04 ENCOUNTER — Ambulatory Visit (HOSPITAL_BASED_OUTPATIENT_CLINIC_OR_DEPARTMENT_OTHER): Payer: Medicare Other | Admitting: *Deleted

## 2013-05-04 ENCOUNTER — Ambulatory Visit (HOSPITAL_BASED_OUTPATIENT_CLINIC_OR_DEPARTMENT_OTHER)
Admission: RE | Admit: 2013-05-04 | Discharge: 2013-05-04 | Disposition: A | Discharge status: Home / Self Care | Payer: Medicare Other | Source: Ambulatory Visit | Attending: Orthopedic Surgery | Admitting: Orthopedic Surgery

## 2013-05-04 DIAGNOSIS — K5190 Ulcerative colitis, unspecified, without complications: Secondary | ICD-10-CM | POA: Diagnosis not present

## 2013-05-04 DIAGNOSIS — Z91013 Allergy to seafood: Secondary | ICD-10-CM | POA: Diagnosis not present

## 2013-05-04 DIAGNOSIS — Z794 Long term (current) use of insulin: Secondary | ICD-10-CM | POA: Diagnosis not present

## 2013-05-04 DIAGNOSIS — M1990 Unspecified osteoarthritis, unspecified site: Secondary | ICD-10-CM | POA: Diagnosis not present

## 2013-05-04 DIAGNOSIS — Z888 Allergy status to other drugs, medicaments and biological substances status: Secondary | ICD-10-CM | POA: Diagnosis not present

## 2013-05-04 DIAGNOSIS — I10 Essential (primary) hypertension: Secondary | ICD-10-CM | POA: Insufficient documentation

## 2013-05-04 DIAGNOSIS — Z9109 Other allergy status, other than to drugs and biological substances: Secondary | ICD-10-CM | POA: Diagnosis not present

## 2013-05-04 DIAGNOSIS — Z885 Allergy status to narcotic agent status: Secondary | ICD-10-CM | POA: Diagnosis not present

## 2013-05-04 DIAGNOSIS — Z79899 Other long term (current) drug therapy: Secondary | ICD-10-CM | POA: Diagnosis not present

## 2013-05-04 DIAGNOSIS — K219 Gastro-esophageal reflux disease without esophagitis: Secondary | ICD-10-CM | POA: Diagnosis not present

## 2013-05-04 DIAGNOSIS — G5600 Carpal tunnel syndrome, unspecified upper limb: Secondary | ICD-10-CM | POA: Diagnosis not present

## 2013-05-04 DIAGNOSIS — E119 Type 2 diabetes mellitus without complications: Secondary | ICD-10-CM | POA: Insufficient documentation

## 2013-05-04 HISTORY — DX: Unspecified osteoarthritis, unspecified site: M19.90

## 2013-05-04 HISTORY — DX: Long term (current) use of insulin: Z79.4

## 2013-05-04 HISTORY — DX: Other specified postprocedural states: R11.2

## 2013-05-04 HISTORY — DX: Nausea with vomiting, unspecified: Z98.890

## 2013-05-04 HISTORY — DX: Ulcerative colitis, unspecified, without complications: K51.90

## 2013-05-04 HISTORY — PX: CARPAL TUNNEL RELEASE: SHX101

## 2013-05-04 HISTORY — DX: Type 2 diabetes mellitus without complications: E11.9

## 2013-05-04 HISTORY — DX: Reserved for inherently not codable concepts without codable children: IMO0001

## 2013-05-04 HISTORY — DX: Personal history of diseases of the blood and blood-forming organs and certain disorders involving the immune mechanism: Z86.2

## 2013-05-04 HISTORY — DX: Carpal tunnel syndrome, right upper limb: G56.01

## 2013-05-04 LAB — GLUCOSE, CAPILLARY: Glucose-Capillary: 181 mg/dL — ABNORMAL HIGH (ref 70–99)

## 2013-05-04 SURGERY — CARPAL TUNNEL RELEASE
Anesthesia: General | Site: Wrist | Laterality: Right | Wound class: Clean

## 2013-05-04 MED ORDER — HYDROMORPHONE HCL 2 MG PO TABS: ORAL_TABLET | ORAL | Status: DC

## 2013-05-04 MED ORDER — PROPOFOL INFUSION 10 MG/ML OPTIME
INTRAVENOUS | Status: DC | PRN
  Administered 2013-05-04: 200 ug/kg/min via INTRAVENOUS

## 2013-05-04 MED ORDER — HYDROMORPHONE HCL 2 MG PO TABS
2.0000 mg | ORAL_TABLET | Freq: Once | ORAL | Status: AC | PRN
Start: 2013-05-04 — End: 2013-05-04
  Administered 2013-05-04: 2 mg via ORAL

## 2013-05-04 MED ORDER — ONDANSETRON HCL 4 MG/2ML IJ SOLN
INTRAMUSCULAR | Status: DC | PRN
  Administered 2013-05-04: 4 mg via INTRAVENOUS

## 2013-05-04 MED ORDER — LIDOCAINE HCL 2 % IJ SOLN
INTRAMUSCULAR | Status: DC | PRN
  Administered 2013-05-04: 5.5 mL

## 2013-05-04 MED ORDER — FENTANYL CITRATE 0.05 MG/ML IJ SOLN: 50.0000 ug | INTRAMUSCULAR | Status: DC | PRN

## 2013-05-04 MED ORDER — CHLORHEXIDINE GLUCONATE 4 % EX LIQD: 60.0000 mL | Freq: Once | CUTANEOUS | Status: DC

## 2013-05-04 MED ORDER — FENTANYL CITRATE 0.05 MG/ML IJ SOLN
INTRAMUSCULAR | Status: DC | PRN
  Administered 2013-05-04: 100 ug via INTRAVENOUS

## 2013-05-04 MED ORDER — LACTATED RINGERS IV SOLN
INTRAVENOUS | Status: DC
  Administered 2013-05-04: 08:00:00 via INTRAVENOUS

## 2013-05-04 MED ORDER — LIDOCAINE HCL (CARDIAC) 20 MG/ML IV SOLN
INTRAVENOUS | Status: DC | PRN
  Administered 2013-05-04: 40 mg via INTRAVENOUS

## 2013-05-04 MED ORDER — PROPOFOL 10 MG/ML IV BOLUS
INTRAVENOUS | Status: DC | PRN
  Administered 2013-05-04: 150 mg via INTRAVENOUS

## 2013-05-04 MED ORDER — MIDAZOLAM HCL 2 MG/ML PO SYRP: 12.0000 mg | ORAL_SOLUTION | Freq: Once | ORAL | Status: DC | PRN

## 2013-05-04 MED ORDER — MIDAZOLAM HCL 5 MG/5ML IJ SOLN
INTRAMUSCULAR | Status: DC | PRN
  Administered 2013-05-04 (×2): 0.5 mg via INTRAVENOUS

## 2013-05-04 MED ORDER — DEXAMETHASONE SODIUM PHOSPHATE 10 MG/ML IJ SOLN
INTRAMUSCULAR | Status: DC | PRN
  Administered 2013-05-04: 4 mg via INTRAVENOUS

## 2013-05-04 MED ORDER — FENTANYL CITRATE 0.05 MG/ML IJ SOLN: 25.0000 ug | INTRAMUSCULAR | Status: DC | PRN

## 2013-05-04 MED ORDER — MIDAZOLAM HCL 2 MG/2ML IJ SOLN: 1.0000 mg | INTRAMUSCULAR | Status: DC | PRN

## 2013-05-04 SURGICAL SUPPLY — 40 items
BANDAGE ADHESIVE 1X3 (GAUZE/BANDAGES/DRESSINGS) IMPLANT
BANDAGE ELASTIC 3 VELCRO ST LF (GAUZE/BANDAGES/DRESSINGS) ×2 IMPLANT
BLADE SURG 15 STRL LF DISP TIS (BLADE) ×1 IMPLANT
BLADE SURG 15 STRL SS (BLADE) ×2
BNDG CMPR 9X4 STRL LF SNTH (GAUZE/BANDAGES/DRESSINGS) ×1
BNDG ESMARK 4X9 LF (GAUZE/BANDAGES/DRESSINGS) ×1 IMPLANT
BRUSH SCRUB EZ PLAIN DRY (MISCELLANEOUS) ×2 IMPLANT
CLOTH BEACON ORANGE TIMEOUT ST (SAFETY) ×2 IMPLANT
CORDS BIPOLAR (ELECTRODE) ×1 IMPLANT
COVER MAYO STAND STRL (DRAPES) ×2 IMPLANT
COVER TABLE BACK 60X90 (DRAPES) ×2 IMPLANT
CUFF TOURNIQUET SINGLE 18IN (TOURNIQUET CUFF) ×1 IMPLANT
DECANTER SPIKE VIAL GLASS SM (MISCELLANEOUS) ×1 IMPLANT
DRAPE EXTREMITY T 121X128X90 (DRAPE) ×2 IMPLANT
DRAPE SURG 17X23 STRL (DRAPES) ×2 IMPLANT
GLOVE BIOGEL M STRL SZ7.5 (GLOVE) ×2 IMPLANT
GLOVE BIOGEL PI IND STRL 7.0 (GLOVE) IMPLANT
GLOVE BIOGEL PI INDICATOR 7.0 (GLOVE) ×1
GLOVE ECLIPSE 6.5 STRL STRAW (GLOVE) ×1 IMPLANT
GLOVE EXAM NITRILE MD LF STRL (GLOVE) ×1 IMPLANT
GLOVE ORTHO TXT STRL SZ7.5 (GLOVE) ×2 IMPLANT
GOWN BRE IMP PREV XXLGXLNG (GOWN DISPOSABLE) ×3 IMPLANT
GOWN PREVENTION PLUS XLARGE (GOWN DISPOSABLE) ×3 IMPLANT
NEEDLE 27GAX1X1/2 (NEEDLE) ×1 IMPLANT
PACK BASIN DAY SURGERY FS (CUSTOM PROCEDURE TRAY) ×2 IMPLANT
PAD CAST 3X4 CTTN HI CHSV (CAST SUPPLIES) ×1 IMPLANT
PADDING CAST ABS 4INX4YD NS (CAST SUPPLIES) ×1
PADDING CAST ABS COTTON 4X4 ST (CAST SUPPLIES) ×1 IMPLANT
PADDING CAST COTTON 3X4 STRL (CAST SUPPLIES) ×2
SPLINT PLASTER CAST XFAST 3X15 (CAST SUPPLIES) ×5 IMPLANT
SPLINT PLASTER XTRA FASTSET 3X (CAST SUPPLIES) ×5
SPONGE GAUZE 4X4 12PLY (GAUZE/BANDAGES/DRESSINGS) ×2 IMPLANT
STOCKINETTE 4X48 STRL (DRAPES) ×2 IMPLANT
STRIP CLOSURE SKIN 1/2X4 (GAUZE/BANDAGES/DRESSINGS) ×2 IMPLANT
SUT PROLENE 3 0 PS 2 (SUTURE) ×2 IMPLANT
SYR 3ML 23GX1 SAFETY (SYRINGE) IMPLANT
SYR CONTROL 10ML LL (SYRINGE) ×1 IMPLANT
TOWEL OR 17X24 6PK STRL BLUE (TOWEL DISPOSABLE) ×2 IMPLANT
TRAY DSU PREP LF (CUSTOM PROCEDURE TRAY) ×2 IMPLANT
UNDERPAD 30X30 INCONTINENT (UNDERPADS AND DIAPERS) ×2 IMPLANT

## 2013-05-04 NOTE — Op Note (Signed)
NAME:  LANIQUA, TORRENS               ACCOUNT NO.:  [id number]  MEDICAL RECORD NO.:  [id number]  LOCATION:  DOIB                         FACILITY:  [facility]  PHYSICIAN:  Youlanda Mighty. Kaneshia Cater, M.D. 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  POSTOPERATIVE DIAGNOSIS:  Significant fascial thickening at distal wrist flexion crease causing recurrent entrapment neuropathy of right median nerve at carpal tunnel.  PROCEDURE:  Re-exploration of right carpal tunnel with release of transcarpal ligament, and second incision to allow safe release of volar forearm fascia.  SURGEON:  Youlanda Mighty. Plummer Matich, M.D.  ASSISTANT:  Julian Reil, P.A.  ANESTHESIA:  General by LMA.  SUPERVISING ANESTHESIOLOGIST:  Soledad Gerlach, MD.  INDICATIONS:  Kadyn Chovan is a 67 year old retired [employer] who is an established patient with our practice. In the 1980s, we performed bilateral carpal tunnel release.  She recently returned reporting increasing discomfort on the volar aspect of her right wrist, a sense of fullness and swelling over the volar aspect of the right wrist and numbness in the right median innervated fingers.  Clinical examination suggested recurrent median entrapped neuropathy symptoms.  Electrodiagnostic studies completed by Dr. Zebedee Iba revealed significant slowing across the right wrist compared to the left.  We advised re-exploration with Ms. Tapp.  Preoperatively, she was reminded of the potential risks and benefits of the surgery.  Questions were invited and answered.  DESCRIPTION OF PROCEDURE:  Sanah Kraska was brought to room 2 of the [facility] and  placed in supine position on the operating table.  Following an anesthesia consult with Dr. Ola Spurr in the holding area, general anesthesia by LMA technique was recommended and accepted.  We reviewed multiple drug allergies including an intolerance to Percocet.  She stated that she could tolerate morphine, therefore Dilaudid will be used for perioperative pain medication.  In room 2, under Dr. Blane Ohara direct supervision, general anesthesia by LMA technique was induced followed by routine Betadine scrub and paint of the right upper extremity.  Following exsanguination of the right arm with Esmarch bandage and arterial tourniquet on the proximal brachium was inflated 220 mmHg.  Procedure commenced with a routine surgical time-out.  We then created a 2-cm incision in the line of the ring finger and the palm.  Subcutaneous tissues were carefully divided revealing a scarred palmar fascia.  This was split in line of its fibers until we identified the superficial palmar arch and the distal common sensory branches of the median nerve. A Penfield 4 elevator was used to carefully sound the carpal canal. There were adhesions to the radial leaflet of the previously release transverse carpal ligament.  These were carefully cleared with a Penfield 4 elevator and gentle scissors dissection.  We cleared a path ulnar and superficial to the nerve followed by subcutaneous release of the volar forearm fascia and release of the transverse carpal ligament proximally.  A Sewell parotid retractor was used to  visualize the distal forearm. There was a dense band of scar at the distal wrist flexion crease, which rendered visualization very challenging.  There was also considerable adipose tissue.  Due to this fact for safety a short transverse incision was fashioned at the distal wrist flexion crease to allow direct inspection of the fascia.  Subcutaneous tissues were carefully divided taking care to  spare the palmar cutaneous branch of the median nerve. The fascia was circumferentially dissected and released proximally and distally.  I then performed a volar forearm fasciotomy on the ulnar aspect of the median nerve.  A Glenna Fellows was then used in the forearm to confirm complete release of the fascia.  It appeared that the site of compression was at the distal wrist flexion crease.  Bleeding points were electrocauterized bipolar current followed by repair of the skin with intradermal 3-0 Prolene.  Lidocaine 2% was soaked for postoperative comfort.  Steri-Strips were applied followed by a compressive dressing of sterile gauze, sterile Webril, and a volar plaster splint maintaining the wrist in 15 degrees dorsiflexion.  For aftercare, Ms. Rosen was provided with prescription for Dilaudid 2 mg 1 or 2 tablets p.o. q.4-6 hours p.r.n. pain, 20 tabs without refill. We will see her back for followup in our office for re-evaluation in 1 week.     Youlanda Mighty Levell Tavano, M.D.     RVS/MEDQ  D:  05/04/2013  T:  05/04/2013  Job:  [id number]

## 2013-05-04 NOTE — Brief Op Note (Signed)
05/04/2013  8:44 AM  PATIENT:  [name]  67 y.o. female  PRE-OPERATIVE DIAGNOSIS:  Bilateral Carpal Tunnel Syndrome  POST-OPERATIVE DIAGNOSIS:  Bilateral Carpal Tunnel Syndrome  PROCEDURE:  REXPLORATION OF RIGHT CARPAL TUNNEL WITH VOLAR FOREARM FASCIOTOMY  SURGEON:  Surgeon(s) and Role:    * Cammie Sickle., MD - Primary  PHYSICIAN ASSISTANT:   ASSISTANTS: Kathyrn Sheriff.A-C   ANESTHESIA:   general  EBL:     BLOOD ADMINISTERED:none  DRAINS: none   LOCAL MEDICATIONS USED:  LIDOCAINE   SPECIMEN:  No Specimen  DISPOSITION OF SPECIMEN:  N/A  COUNTS:  YES  TOURNIQUET:  * Missing tourniquet times found for documented tourniquets in log:  [id number] *  DICTATION: .Other Dictation: Dictation Number [id number]  PLAN OF CARE: Discharge to home after PACU  PATIENT DISPOSITION:  PACU - hemodynamically stable.   Delay start of Pharmacological VTE agent (>24hrs) due to surgical blood loss or risk of bleeding: not applicable

## 2013-05-04 NOTE — Transfer of Care (Signed)
Immediate Anesthesia Transfer of Care Note  Patient: [name]  Procedure(s) Performed: Procedure(s): CARPAL TUNNEL RELEASE (Right)  Patient Location: PACU  Anesthesia Type:General  Level of Consciousness: awake, alert  and oriented  Airway & Oxygen Therapy: Patient Spontanous Breathing and Patient connected to face mask oxygen  Post-op Assessment: Report given to PACU RN, Post -op Vital signs reviewed and stable and Patient moving all extremities  Post vital signs: Reviewed and stable  Complications: No apparent anesthesia complications

## 2013-05-04 NOTE — Anesthesia Postprocedure Evaluation (Signed)
  Anesthesia Post-op Note  Patient: [name]  Procedure(s) Performed: Procedure(s): CARPAL TUNNEL RELEASE (Right)  Patient Location: PACU  Anesthesia Type:General  Level of Consciousness: awake, alert  and oriented  Airway and Oxygen Therapy: Patient Spontanous Breathing  Post-op Pain: none  Post-op Assessment: Post-op Vital signs reviewed, Patient's Cardiovascular Status Stable, Respiratory Function Stable, Patent Airway and No signs of Nausea or vomiting  Post-op Vital Signs: Reviewed and stable  Complications: No apparent anesthesia complications

## 2013-05-04 NOTE — Anesthesia Preprocedure Evaluation (Addendum)
Anesthesia Evaluation  Patient identified by MRN, date of birth, ID band Patient awake    Reviewed: Allergy & Precautions, H&P , NPO status , Patient's Chart, lab work & pertinent test results, reviewed documented beta blocker date and time   History of Anesthesia Complications (+) PONV  Airway Mallampati: II TM Distance: >3 FB Neck ROM: Full    Dental no notable dental hx. (+) Teeth Intact and Dental Advisory Given   Pulmonary neg pulmonary ROS,  breath sounds clear to auscultation  Pulmonary exam normal       Cardiovascular hypertension, On Medications and On Home Beta Blockers Rhythm:Regular Rate:Normal     Neuro/Psych negative neurological ROS  negative psych ROS   GI/Hepatic Neg liver ROS, PUD, GERD-  Medicated and Controlled,  Endo/Other  diabetes, Well Controlled, Type 1, Insulin DependentMorbid obesity  Renal/GU negative Renal ROS  negative genitourinary   Musculoskeletal   Abdominal   Peds  Hematology negative hematology ROS (+)   Anesthesia Other Findings   Reproductive/Obstetrics negative OB ROS                          Anesthesia Physical Anesthesia Plan  ASA: III  Anesthesia Plan: General   Post-op Pain Management:    Induction: Intravenous  Airway Management Planned: LMA  Additional Equipment:   Intra-op Plan:   Post-operative Plan: Extubation in OR  Informed Consent: I have reviewed the patients History and Physical, chart, labs and discussed the procedure including the risks, benefits and alternatives for the proposed anesthesia with the patient or authorized representative who has indicated his/her understanding and acceptance.   Dental advisory given  Plan Discussed with: CRNA and Surgeon  Anesthesia Plan Comments:        Anesthesia Quick Evaluation

## 2013-05-04 NOTE — Op Note (Deleted)
NAME:  AHNYLA, MENDEL               ACCOUNT NO.:  [id number]  MEDICAL RECORD NO.:  [id number]  LOCATION:  DOIB                         FACILITY:  [facility]  PHYSICIAN:  Youlanda Mighty. Ziana Heyliger, M.D. 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  POSTOPERATIVE DIAGNOSIS:  Significant fascial thickening at distal wrist flexion crease causing recurrent entrapment neuropathy of right median nerve at carpal tunnel.  PROCEDURE:  Re-exploration of right carpal tunnel with release of transcarpal ligament, and second incision to allow safe release of volar forearm fascia.  SURGEON:  Youlanda Mighty. Reshard Guillet, M.D.  ASSISTANT:  Julian Reil, P.A.  ANESTHESIA:  General by LMA.  SUPERVISING ANESTHESIOLOGIST:  Soledad Gerlach, MD.  INDICATIONS:  Sherolyn Trettin is a 67 year old retired [employer] who is an established patient with our practice. In the 1980s, we performed bilateral carpal tunnel release.  She recently returned reporting increasing discomfort on the volar aspect of her right wrist, a sense of fullness and swelling over the volar aspect of the right wrist and numbness in the right median innervated fingers.  Clinical examination suggested recurrent median entrapped neuropathy symptoms.  Electrodiagnostic studies completed by Dr. Zebedee Iba revealed significant slowing across the right wrist compared to the left.  We advised re-exploration with Ms. Diego.  Preoperatively, she was reminded of the potential risks and benefits of the surgery.  Questions were invited and answered.  DESCRIPTION OF PROCEDURE:  Milaina Sher was brought to room 2 of the [facility] and  placed in supine position on the operating table.  Following an anesthesia consult with Dr. Ola Spurr in the holding area, general anesthesia by LMA technique was recommended and accepted.  We reviewed multiple drug allergies including an intolerance to Percocet.  She stated that she could tolerate morphine, therefore Dilaudid will be used for perioperative pain medication.  In room 2, under Dr. Blane Ohara direct supervision, general anesthesia by LMA technique was induced followed by routine Betadine scrub and paint of the right upper extremity.  Following exsanguination of the right arm with Esmarch bandage and arterial tourniquet on the proximal brachium was inflated 220 mmHg.  Procedure commenced with a routine surgical time-out.  We then created a 2-cm incision in the line of the ring finger and the palm.  Subcutaneous tissues were carefully divided revealing a scarred palmar fascia.  This was split in line of its fibers until we identified the superficial palmar arch and the distal common sensory branches of the median nerve. A Penfield 4 elevator was used to carefully sound the carpal canal. There were adhesions to the radial leaflet of the previously release transverse carpal ligament.  These were carefully cleared with a Penfield 4 elevator and gentle scissors dissection.  We cleared a path ulnar and superficial to the nerve followed by subcutaneous release of the volar forearm fascia and release of the transverse carpal ligament proximally.  A Sewell parotid retractor was used to  visualize the distal forearm. There was a dense band of scar at the distal wrist flexion crease, which rendered visualization very challenging.  There was also considerable adipose tissue.  Due to this fact for safety a short transverse incision was fashioned at the distal wrist flexion crease to allow direct inspection of the fascia.  Subcutaneous tissues were carefully divided taking care to  spare the palmar cutaneous branch of the median nerve. The fascia was circumferentially dissected and released proximally and distally.  I then performed a volar forearm fasciotomy on the ulnar aspect of the median nerve.  A Glenna Fellows was then used in the forearm to confirm complete release of the fascia.  It appeared that the site of compression was at the distal wrist flexion crease.  Bleeding points were electrocauterized bipolar current followed by repair of the skin with intradermal 3-0 Prolene.  Lidocaine 2% was soaked for postoperative comfort.  Steri-Strips were applied followed by a compressive dressing of sterile gauze, sterile Webril, and a volar plaster splint maintaining the wrist in 15 degrees dorsiflexion.  For aftercare, Ms. Viger was provided with prescription for Dilaudid 2 mg 1 or 2 tablets p.o. q.4-6 hours p.r.n. pain, 20 tabs without refill. We will see her back for followup in our office for re-evaluation in 1 week.     Youlanda Mighty Keedan Sample, M.D.     RVS/MEDQ  D:  05/04/2013  T:  05/04/2013  Job:  [id number]

## 2013-05-04 NOTE — Op Note (Signed)
[id number] 

## 2013-05-04 NOTE — Anesthesia Procedure Notes (Signed)
Procedure Name: LMA Insertion Date/Time: 05/04/2013 8:18 AM Performed by: [place] Pre-anesthesia Checklist: Patient identified, Emergency Drugs available, Suction available and Patient being monitored Patient Re-evaluated:Patient Re-evaluated prior to inductionOxygen Delivery Method: Circle System Utilized Preoxygenation: Pre-oxygenation with 100% oxygen Intubation Type: IV induction Ventilation: Mask ventilation without difficulty LMA: LMA inserted LMA Size: 4.0 Number of attempts: 1 Airway Equipment and Method: bite block Placement Confirmation: positive ETCO2 and breath sounds checked- equal and bilateral Tube secured with: Tape Dental Injury: Teeth and Oropharynx as per pre-operative assessment

## 2013-05-05 ENCOUNTER — Encounter (HOSPITAL_BASED_OUTPATIENT_CLINIC_OR_DEPARTMENT_OTHER): Payer: Self-pay | Admitting: Orthopedic Surgery

## 2013-05-11 ENCOUNTER — Other Ambulatory Visit: Payer: Self-pay | Admitting: *Deleted

## 2013-05-11 MED ORDER — GLUCOSE BLOOD VI STRP: ORAL_STRIP | Status: DC

## 2013-05-11 MED ORDER — ONETOUCH DELICA LANCETS FINE MISC: 2.0000 | Freq: Two times a day (BID) | Status: DC

## 2013-05-11 NOTE — Telephone Encounter (Signed)
rx refill

## 2013-05-24 ENCOUNTER — Ambulatory Visit: Payer: Medicare Other | Admitting: Internal Medicine

## 2013-05-28 ENCOUNTER — Ambulatory Visit: Payer: BC Managed Care – PPO | Admitting: Obstetrics & Gynecology

## 2013-05-28 ENCOUNTER — Ambulatory Visit: Payer: Medicare Other | Admitting: Obstetrics & Gynecology

## 2013-06-02 DIAGNOSIS — G5600 Carpal tunnel syndrome, unspecified upper limb: Secondary | ICD-10-CM | POA: Diagnosis not present

## 2013-06-08 ENCOUNTER — Ambulatory Visit (INDEPENDENT_AMBULATORY_CARE_PROVIDER_SITE_OTHER): Payer: Medicare Other | Admitting: Internal Medicine

## 2013-06-08 ENCOUNTER — Encounter: Payer: Self-pay | Admitting: Internal Medicine

## 2013-06-08 ENCOUNTER — Encounter: Payer: Self-pay | Admitting: Endocrinology

## 2013-06-08 ENCOUNTER — Ambulatory Visit (INDEPENDENT_AMBULATORY_CARE_PROVIDER_SITE_OTHER): Payer: Medicare Other | Admitting: Endocrinology

## 2013-06-08 VITALS — BP 132/74 | HR 78 | Ht 61.0 in | Wt 197.0 lb

## 2013-06-08 VITALS — BP 128/82 | HR 70 | Temp 97.6°F | Wt 197.4 lb

## 2013-06-08 DIAGNOSIS — K5190 Ulcerative colitis, unspecified, without complications: Secondary | ICD-10-CM | POA: Diagnosis not present

## 2013-06-08 DIAGNOSIS — I10 Essential (primary) hypertension: Secondary | ICD-10-CM | POA: Diagnosis not present

## 2013-06-08 DIAGNOSIS — Z23 Encounter for immunization: Secondary | ICD-10-CM | POA: Diagnosis not present

## 2013-06-08 DIAGNOSIS — G5600 Carpal tunnel syndrome, unspecified upper limb: Secondary | ICD-10-CM | POA: Diagnosis not present

## 2013-06-08 DIAGNOSIS — F411 Generalized anxiety disorder: Secondary | ICD-10-CM | POA: Diagnosis not present

## 2013-06-08 DIAGNOSIS — E1165 Type 2 diabetes mellitus with hyperglycemia: Secondary | ICD-10-CM

## 2013-06-08 DIAGNOSIS — E1049 Type 1 diabetes mellitus with other diabetic neurological complication: Secondary | ICD-10-CM | POA: Diagnosis not present

## 2013-06-08 DIAGNOSIS — F419 Anxiety disorder, unspecified: Secondary | ICD-10-CM

## 2013-06-08 LAB — HEMOGLOBIN A1C: Hgb A1c MFr Bld: 7.4 % — ABNORMAL HIGH (ref 4.6–6.5)

## 2013-06-08 MED ORDER — ALPRAZOLAM 0.5 MG PO TABS: 0.5000 mg | ORAL_TABLET | Freq: Every evening | ORAL | Status: DC | PRN

## 2013-06-08 MED ORDER — VENLAFAXINE HCL ER 75 MG PO CP24: 75.0000 mg | ORAL_CAPSULE | Freq: Every day | ORAL | Status: DC

## 2013-06-08 NOTE — Assessment & Plan Note (Signed)
The current medical regimen is effective;  continue present plan and medications. BP Readings from Last 3 Encounters:  06/08/13 128/82  05/04/13 161/78  05/04/13 161/78

## 2013-06-08 NOTE — Patient Instructions (Signed)
It was good to see you today. We have reviewed your prior records including labs and tests today Your annual flu shot was given and/or updated today. Medications reviewed and updated, no changes recommended at this time. Refill on medication(s) as discussed today. Please schedule followup in 6 months, call sooner if problems.

## 2013-06-08 NOTE — Assessment & Plan Note (Signed)
Follows with GI for same - Chronic diarrhea -uses over-the-counter Imodium 3 times a day plus when necessary for same Clinically in remission per GI note spring 2014, on Remicade q8wk Interval history reviewed, continue same

## 2013-06-08 NOTE — Progress Notes (Signed)
Subjective:    Patient ID: [name], female    DOB: 19-Nov-1945, 67 y.o.   MRN: [id number]  HPI Here for follow up - reviewed chronic medical issues  Chronic "neuropathic" foot pain -ongoing feet numbness, bilateral - chronic issue for years - NCS 2007, repeated summer 2012 unremarkable by pt report (not available). Intol of amitrip, trazadone, effexor, cymbalta, and gabapentin   anxiety - started on pristiq by obg after poor tol of other meds - uses prn xanax but ?other non habit forming med - changed to generic alternative venlafaxine 07/2012  DM2 - follows with endo for same - dx 2010 - intol of oral meds -thus on insulin. she was unable to tolerate metformin (diarrhea), januvia (headache), byetta (abdominal bruising), actos (edema), onglyza, glipizide and bromocrip. states cbg's are well-controlled. pt says her diet and exercise are good.   hypertension - reports compliance with ongoing medical treatment and no changes in medication dose or frequency. denies adverse side effects related to current therapy. chronic LE edema, on furosemide  Dyslipidemia - intol of prior rx wth crestor, lipitor and zocor - poorly tolerated due to myalgias -   GERD - reports compliance with ongoing medical treatment and no changes in medication dose or frequency. denies adverse side effects related to current therapy.   Ulcerative colitis, chronic diarrhea associated with same - currently in remission on Remicade -no bleeding or abdominal pain symptoms at this time - follows with GI for same -   Past Medical History  Diagnosis Date  . GERD   . Osteoarthritis   . HYPERTENSION   . IDDM (insulin dependent diabetes mellitus)   . Ulcerative colitis     Remicade infusion Q 8 weeks  . History of thrombocytopenia 12/2011  . 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 Dental bridge present     lower  . Anxiety      Review of Systems  Constitutional: Negative for fever and  unexpected weight change.  Respiratory: Negative for cough and shortness of breath.   Cardiovascular: Negative for chest pain and palpitations.  Neurological: Negative for syncope and headaches.  Psychiatric/Behavioral: Positive for sleep disturbance. Negative for self-injury and dysphoric mood. The patient is not nervous/anxious.        Objective:   Physical Exam BP 128/82  Pulse 70  Temp(Src) 97.6 F (36.4 C) (Oral)  Wt 197 lb 6.4 oz (89.54 kg)  BMI 36.1 kg/m2  SpO2 98% Wt Readings from Last 3 Encounters:  06/08/13 197 lb 6.4 oz (89.54 kg)  05/04/13 199 lb (90.266 kg)  05/04/13 199 lb (90.266 kg)  Constitutional: She Is overweight, but appears well-developed and well-nourished. No distress.  Eyes: Conjunctivae and EOM are normal. Pupils are equal, round, and reactive to light. No scleral icterus.  Neck: Normal range of motion. Neck supple. No JVD present. No thyromegaly present.  Cardiovascular: Normal rate, regular rhythm and normal heart sounds.  No murmur heard. trace BLE edema/fatty ankles. Pulmonary/Chest: Effort normal and breath sounds normal. No respiratory distress. She has no wheezes.  Neurological: She is alert and oriented to person, place, and time. No cranial nerve deficit. Coordination, balance, strength, speech and gait are normal.  Psychiatric: She has a normal mood and affect. Her behavior is normal. Judgment and thought content normal.     Lab Results  Component Value Date   WBC 7.8 04/05/2013   HGB 13.5 05/04/2013   HCT 39.2 04/05/2013   PLT 186 04/05/2013  CHOL 175 01/24/2011   TRIG 538.0* 01/24/2011   HDL 40.30 01/24/2011   LDLDIRECT 69.5 01/24/2011   ALT 17 04/28/2012   AST 27 04/28/2012   NA 135 05/03/2013   K 4.0 05/03/2013   CL 96 05/03/2013   CREATININE 0.67 05/03/2013   BUN 12 05/03/2013   CO2 29 05/03/2013   TSH 2.080 04/28/2012   INR 1.25 01/14/2012   HGBA1C 7.1* 03/09/2013   MICROALBUR 0.6 12/07/2012       Assessment & Plan:  See problem list. Medications  and labs reviewed today.  Time spent with pt today 25 minutes, greater than 50% time spent counseling patient on diabetes, anxiety and medication review. Also review of prior records

## 2013-06-08 NOTE — Patient Instructions (Addendum)
check your blood sugar twice a day.  vary the time of day when you check, between before the 3 meals, and at bedtime.  also check if you have symptoms of your blood sugar being too high or too low.  please keep a record of the readings and bring it to your next appointment here.  please call us sooner if your blood sugar goes below 70, or if it stays over 200.   Please come back for a follow-up appointment in 3 months.   blood tests are being requested for you today.  We'll contact you with results.   If it is high, we'll increase the lunch and supper insulin.

## 2013-06-08 NOTE — Progress Notes (Signed)
Subjective:    Patient ID: [name], female    DOB: 06-10-1946, 67 y.o.   MRN: [id number]  HPI Pt returns for f/u of type 2 DM (dx'ed 2007; he has mild if any neuropathy of the lower extremities; no other associated complications; she has never had an episode of severe hypoglycemia).  Since last ov, she has not required any further steroid shots.  she brings a record of her cbg's which i have reviewed today.  It varies from 66-300, but most are in the 100's.  It is in general higher as the day goes on, but not necessarily so.  She has weight gain. Past Medical History  Diagnosis Date  . GERD   . Osteoarthritis   . HYPERTENSION   . IDDM (insulin dependent diabetes mellitus)   . Ulcerative colitis     Remicade infusion Q 8 weeks  . History of thrombocytopenia 12/2011  . 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 Dental bridge present     lower  . Anxiety     Past Surgical History  Procedure Laterality Date  . Abdominal hysterectomy  1980    partial  . Breast reduction surgery  1994  . Rectocele repair  1990; 09/12/2006  . Carpal tunnel release Right 1996  . Knee arthroscopy Right 01/1999; 10/2000  . Hemilaminotomy lumbar spine Bilateral 09/07/1999    L4-5  . Cholecystectomy    . Flexible sigmoidoscopy  01/17/2012    Procedure: FLEXIBLE SIGMOIDOSCOPY;  Surgeon: Rogene Houston, MD;  [facility];  Service: Endoscopy;  Laterality: N/A;  . Bilateral salpingoophorectomy  02/10/2001  . Tarsal tunnel release  2002  . Ureterolysis Right 02/10/2001  . Lysis of adhesion  02/10/2001  . Carpal tunnel release Left 03/21/2003  . 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 Esophagogastroduodenoscopy (egd) with esophageal dilation  12/02/2005  . Carpal tunnel release Right 05/04/2013    Procedure: CARPAL TUNNEL RELEASE;  Surgeon: Cammie Sickle., MD;  Location: [facility];  Service: Orthopedics;  Laterality: Right;    History    Social History  . Marital Status: Married    Spouse Name: N/A    Number of Children: N/A  . Years of Education: N/A   Occupational History  . Not on file.   Social History Main Topics  . Smoking status: Never Smoker   . Smokeless tobacco: Never Used  . Alcohol Use: No  . Drug Use: No  . Sexual Activity: Yes    Partners: Male    Birth Control/ Protection: Surgical     Comment: hysterectomy   Other Topics Concern  . Not on file   Social History Narrative  . No narrative on file    Current Outpatient Prescriptions on File Prior to Visit  Medication Sig Dispense Refill  . esomeprazole (NEXIUM) 40 MG capsule Take 1 capsule (40 mg total) by mouth 2 (two) times daily.  180 capsule  2  . Flora-Q (FLORA-Q) CAPS Take 1 capsule by mouth daily.  60 capsule  1  . furosemide (LASIX) 40 MG tablet Take 1 tablet (40 mg total) by mouth daily.  90 tablet  1  . glucose blood test strip Test 2 times daily as directed  100 each  10  . inFLIXimab (REMICADE) 100 MG injection Inject into the vein every 8 (eight) weeks.       . Insulin Pen Needle 31G  X 5 MM MISC USE AS DIRECTED BY PHYSICIAN.  100 each  3  . loperamide (IMODIUM) 2 MG capsule Take 2 mg by mouth 2 (two) times daily.       . meclizine (ANTIVERT) 25 MG tablet Take 1 tablet (25 mg total) by mouth 3 (three) times daily as needed for dizziness or nausea.  30 tablet  0  . metoprolol succinate (TOPROL-XL) 100 MG 24 hr tablet Take 1 tablet (100 mg total) by mouth 2 (two) times daily.  180 tablet  3  . ONE TOUCH LANCETS MISC PATIENT USES ONE TOUCH DELICAL 80H LANCETS. USE AS DIRECTED TWICE DAILY'; DIAGNOSIS CODE 250.02  200 each  6  . ONETOUCH DELICA LANCETS FINE MISC 2 each by Does not apply route 2 (two) times daily.  100 each  10  . ONETOUCH DELICA LANCETS MISC And lancets, twice daily dx 250.02      . potassium chloride (K-DUR) 10 MEQ tablet Take 2 tablets (20 mEq total) by mouth daily.  90 tablet  1  . promethazine (PHENERGAN) 12.5 MG  tablet       . [DISCONTINUED] amitriptyline (ELAVIL) 25 MG tablet Take 1 tablet (25 mg total) by mouth at bedtime. start with 1/2 tab at night for 1 week then increase to 1 whole tab.  30 tablet  3  . [DISCONTINUED] bromocriptine (PARLODEL) 2.5 MG tablet Take 2.5 mg by mouth 2 (two) times daily.         No current facility-administered medications on file prior to visit.    Allergies  Allergen Reactions  . Actos [Pioglitazone] Other (See Comments)    EDEMA   . Benzocaine-Menthol Swelling    SWELLING OF MOUTH  . Colesevelam Other (See Comments)    GI UPSET  . Flagyl [Metronidazole Hcl] Other (See Comments)    DIAPHORESIS  . Metformin And Related Diarrhea  . Omeprazole Swelling    SWELLING OF TONGUE AND THROAT  . Shrimp [Shellfish Allergy] Itching    OF THROAT AND EARS  . Statins Other (See Comments)    HEART RACING  . Hydromorphone Itching  . Adhesive [Tape] Other (See Comments)    SKIN IRRITATION AND BRUISING  . Nisoldipine Itching  . Percocet [Oxycodone-Acetaminophen] Itching    Family History  Problem Relation Age of Onset  . Diabetes Mother   . Hypertension Mother   . Heart attack Father     Mid 93's  . Heart disease Father   . Lung disease Father     spot on lung; had lung surgery  . Alcohol abuse Other   . Hypertension Son   . Diabetes Son    BP 132/74  Pulse 78  Ht 5' 1"  (1.549 m)  Wt 197 lb (89.359 kg)  BMI 37.24 kg/m2  SpO2 98%  Review of Systems Denies LOC and edema    Objective:   Physical Exam VITAL SIGNS:  See vs page GENERAL: no distress SKIN:  Insulin injection sites at the anterior abdomen are normal  Lab Results  Component Value Date   HGBA1C 7.4* 06/08/2013      Assessment & Plan:  DM: This insulin regimen was chosen from multiple options, as it best matches her insulin to her changing requirements throughout the day.  The benefits of glycemic control must be weighed against the risks of hypoglycemia.  She needs increased  rx. Neuropathy: this limits exercise rx of DM.

## 2013-06-08 NOTE — Assessment & Plan Note (Signed)
Chronic issues, exac by complicated deaths of father and father in law Intolerant of cymbalta, Willette [place], elavil trials in past - 07/2012 changed pristiq to venlafaxine - doing well on same Prefers non-habit forming medication for anxiety as needed - but poorly tol BuSpar trial in place of xanax 01/2013 Support offered for emotional stressors ( father's death October 01, 2012, family dispute with sister in interim)

## 2013-06-09 ENCOUNTER — Other Ambulatory Visit: Payer: Self-pay

## 2013-06-09 DIAGNOSIS — E1049 Type 1 diabetes mellitus with other diabetic neurological complication: Secondary | ICD-10-CM | POA: Insufficient documentation

## 2013-06-16 DIAGNOSIS — G5600 Carpal tunnel syndrome, unspecified upper limb: Secondary | ICD-10-CM | POA: Diagnosis not present

## 2013-06-24 ENCOUNTER — Telehealth (INDEPENDENT_AMBULATORY_CARE_PROVIDER_SITE_OTHER): Payer: Self-pay | Admitting: *Deleted

## 2013-06-24 NOTE — Telephone Encounter (Signed)
I called [employer]/spoke with [employer]. The Nexium was approved . Dates of service are 06/03/13----06/24/14. Confirmation number [id number]. [facility] called and made aware.

## 2013-06-28 ENCOUNTER — Encounter (HOSPITAL_COMMUNITY)
Admission: RE | Admit: 2013-06-28 | Discharge: 2013-06-28 | Disposition: A | Discharge status: Home / Self Care | Payer: Medicare Other | Source: Ambulatory Visit | Attending: Internal Medicine | Admitting: Internal Medicine

## 2013-06-28 DIAGNOSIS — K5190 Ulcerative colitis, unspecified, without complications: Secondary | ICD-10-CM | POA: Diagnosis not present

## 2013-06-28 MED ORDER — SODIUM CHLORIDE 0.9 % IV SOLN
INTRAVENOUS | Status: DC
  Administered 2013-06-28: 13:00:00 via INTRAVENOUS

## 2013-06-28 MED ORDER — INFLIXIMAB 100 MG IV SOLR
5.0000 mg/kg | INTRAVENOUS | Status: DC
  Administered 2013-06-28: 500 mg via INTRAVENOUS
  Filled 2013-06-28: qty 50

## 2013-06-29 ENCOUNTER — Telehealth: Payer: Self-pay

## 2013-06-29 MED ORDER — INSULIN ASPART 100 UNIT/ML FLEXPEN: 55.0000 [IU] | PEN_INJECTOR | Freq: Three times a day (TID) | SUBCUTANEOUS | Status: DC

## 2013-06-29 NOTE — Telephone Encounter (Signed)
Pt advised and rx sent

## 2013-06-29 NOTE — Telephone Encounter (Signed)
Please increase novolog to 3 times a day (just before each meal) 55-65-65 units. Call if this does not help.

## 2013-06-29 NOTE — Telephone Encounter (Signed)
Pt left voicemail stating she her cbg has been in the 200's she is currently using Novolog Flexpen 45, 55 and 55 units, pt states the pharmacy needs new rx sent to pharmacy if you adjust rx

## 2013-06-30 ENCOUNTER — Other Ambulatory Visit: Payer: Self-pay

## 2013-06-30 MED ORDER — INSULIN ASPART 100 UNIT/ML FLEXPEN: 55.0000 [IU] | PEN_INJECTOR | Freq: Three times a day (TID) | SUBCUTANEOUS | Status: DC

## 2013-07-04 ENCOUNTER — Other Ambulatory Visit: Payer: Self-pay | Admitting: Endocrinology

## 2013-07-04 MED ORDER — INSULIN ASPART 100 UNIT/ML FLEXPEN: PEN_INJECTOR | SUBCUTANEOUS | Status: DC

## 2013-07-09 ENCOUNTER — Other Ambulatory Visit: Payer: Self-pay | Admitting: Internal Medicine

## 2013-07-09 ENCOUNTER — Telehealth: Payer: Self-pay | Admitting: Endocrinology

## 2013-07-09 NOTE — Telephone Encounter (Signed)
Pt advised.

## 2013-07-09 NOTE — Telephone Encounter (Signed)
increase novolog to 3 times a day (just before each meal) 50-60-60 units.

## 2013-07-19 ENCOUNTER — Telehealth (INDEPENDENT_AMBULATORY_CARE_PROVIDER_SITE_OTHER): Payer: Self-pay | Admitting: *Deleted

## 2013-07-19 NOTE — Telephone Encounter (Signed)
[name] is having back pain that goes around to her right side. The pain comes and goes. She would like to know if this could be coming from her problem or could it be from something else. If [name] would please return her call at [phone].

## 2013-07-22 NOTE — Telephone Encounter (Signed)
Per Dr.Rehman see Primary Care Physician, patient was made aware.

## 2013-07-28 ENCOUNTER — Other Ambulatory Visit: Payer: Self-pay

## 2013-07-28 DIAGNOSIS — E1049 Type 1 diabetes mellitus with other diabetic neurological complication: Secondary | ICD-10-CM

## 2013-07-28 DIAGNOSIS — E1165 Type 2 diabetes mellitus with hyperglycemia: Secondary | ICD-10-CM

## 2013-07-28 MED ORDER — INSULIN PEN NEEDLE 31G X 5 MM MISC: Status: DC

## 2013-08-02 ENCOUNTER — Encounter (HOSPITAL_COMMUNITY): Payer: Medicare Other | Attending: Internal Medicine

## 2013-08-02 DIAGNOSIS — D7289 Other specified disorders of white blood cells: Secondary | ICD-10-CM | POA: Diagnosis not present

## 2013-08-02 DIAGNOSIS — D72820 Lymphocytosis (symptomatic): Secondary | ICD-10-CM

## 2013-08-02 LAB — CBC WITH DIFFERENTIAL/PLATELET
Basophils Relative: 0 % (ref 0–1)
HCT: 40.7 % (ref 36.0–46.0)
Hemoglobin: 14 g/dL (ref 12.0–15.0)
Lymphs Abs: 4.6 10*3/uL — ABNORMAL HIGH (ref 0.7–4.0)
MCHC: 34.4 g/dL (ref 30.0–36.0)
Monocytes Absolute: 0.5 10*3/uL (ref 0.1–1.0)
Monocytes Relative: 7 % (ref 3–12)
Neutro Abs: 1.6 10*3/uL — ABNORMAL LOW (ref 1.7–7.7)
Neutrophils Relative %: 24 % — ABNORMAL LOW (ref 43–77)
RBC: 4.39 MIL/uL (ref 3.87–5.11)

## 2013-08-02 NOTE — Progress Notes (Signed)
Labs drawn today for cbc/diff,flow

## 2013-08-09 ENCOUNTER — Encounter (HOSPITAL_BASED_OUTPATIENT_CLINIC_OR_DEPARTMENT_OTHER): Payer: Medicare Other

## 2013-08-09 DIAGNOSIS — D72820 Lymphocytosis (symptomatic): Secondary | ICD-10-CM

## 2013-08-09 NOTE — Progress Notes (Signed)
[facility]  OFFICE PROGRESS NOTE  [name], MD [place]. [facility] [address] [place] [address]  DIAGNOSIS: Lymphocytosis - Plan: CBC with Differential, Comprehensive metabolic panel, Save smear Ulcerative colitis   CURRENT THERAPY:Remicaide infusion for Ulcerative colitis   INTERVAL HISTORY: [name] 67 y.o. female returns for  Follow up. Since the time of last visit her ulcerative colitis flareups  Are getting better. She is on  scheduled remicaid infusions for ulcerative colitis as recommended by gastroenterologist. She does complainof backache and she was evaluated by the primary care physicia and was told it could be a muscle pull.  She  denies any headaches, dizziness, double vision, fevers, chills, night sweats, nausea, vomiting, diarrhea, constipation, chest pain, heart palpitations, shortness of breath, blood in stool, black tarry stool, urinary pain, urinary burning, urinary frequency, hematuria. Her flow cytometry which was performed on November 3,2014  was essentially negative for any monoclonal process. Her absolute lymphocyte count  was 5600 in Jan, 2014 followed by a decreased to 4600 on 08/02/2013.   MEDICAL HISTORY: Past Medical History  Diagnosis Date  . GERD   . Osteoarthritis   . HYPERTENSION   . IDDM (insulin dependent diabetes mellitus)   . Ulcerative colitis     Remicade infusion Q 8 weeks  . History of thrombocytopenia 12/2011  . 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 Dental bridge present     lower  . Anxiety     INTERIM HISTORY: has DM (diabetes mellitus), type 2, uncontrolled; HYPERTENSION; GERD; ULCERATIVE COLITIS; MENOPAUSAL DISORDER; SHOULDER PAIN, RIGHT; PALPITATIONS, RECURRENT; CHEST PAIN; Other postprocedural status; FATIGUE; Dyslipidemia; Numbness in feet; Anxiety; Peripheral neuropathy; Insomnia; Thrombocytopenia; Elevated LFTs; Myalgia;  Polyarthralgia; Lymphocytosis; and Type I (juvenile type) diabetes mellitus with neurological manifestations, not stated as uncontrolled(250.61) on her problem list.    ALLERGIES:  is allergic to actos; benzocaine-menthol; colesevelam; flagyl; metformin and related; omeprazole; shrimp; statins; hydromorphone; adhesive; nisoldipine; and percocet.  MEDICATIONS:  Current Outpatient Prescriptions  Medication Sig Dispense Refill  . esomeprazole (NEXIUM) 40 MG capsule Take 1 capsule (40 mg total) by mouth 2 (two) times daily.  180 capsule  2  . furosemide (LASIX) 40 MG tablet Take 1 tablet (40 mg total) by mouth daily.  90 tablet  1  . glucose blood test strip Test 2 times daily as directed  100 each  10  . inFLIXimab (REMICADE) 100 MG injection Inject into the vein every 8 (eight) weeks.       . insulin aspart (NOVOLOG) 100 UNIT/ML SOPN FlexPen 3 times a day (just before each meal) 45-55-55 units, and pen needles 3/day  150 mL  3  . Insulin Pen Needle 31G X 5 MM MISC USE AS DIRECTED BY PHYSICIAN.  200 each  3  . KLOR-CON M10 10 MEQ tablet TAKE 2 TABLETS (20 MEQ) DAILY  180 tablet  1  . loperamide (IMODIUM) 2 MG capsule Take 4 mg by mouth 2 (two) times daily.       . Melatonin 3 MG CAPS Take by mouth at bedtime.      . Naproxen Sodium (ALEVE PO) Take by mouth. Takes 2 Aleve PM @@ bedtime nightly      . ONE TOUCH LANCETS MISC PATIENT USES ONE TOUCH DELICAL 00Q LANCETS. USE AS DIRECTED TWICE DAILY'; DIAGNOSIS CODE 250.02  200 each  6  . ONETOUCH DELICA LANCETS  FINE MISC 2 each by Does not apply route 2 (two) times daily.  100 each  10  . ONETOUCH DELICA LANCETS MISC And lancets, twice daily dx 250.02      . venlafaxine XR (EFFEXOR-XR) 75 MG 24 hr capsule Take 1 capsule (75 mg total) by mouth daily.  90 capsule  3  . Flora-Q (FLORA-Q) CAPS Take 1 capsule by mouth daily.  60 capsule  1  . meclizine (ANTIVERT) 25 MG tablet Take 1 tablet (25 mg total) by mouth 3 (three) times daily as needed for dizziness  or nausea.  30 tablet  0  . metoprolol succinate (TOPROL-XL) 100 MG 24 hr tablet Take 1 tablet (100 mg total) by mouth 2 (two) times daily.  180 tablet  3  . promethazine (PHENERGAN) 12.5 MG tablet       . [DISCONTINUED] amitriptyline (ELAVIL) 25 MG tablet Take 1 tablet (25 mg total) by mouth at bedtime. start with 1/2 tab at night for 1 week then increase to 1 whole tab.  30 tablet  3  . [DISCONTINUED] bromocriptine (PARLODEL) 2.5 MG tablet Take 2.5 mg by mouth 2 (two) times daily.        . [DISCONTINUED] potassium chloride (K-DUR) 10 MEQ tablet Take 2 tablets (20 mEq total) by mouth daily.  90 tablet  1   No current facility-administered medications for this visit.    SURGICAL HISTORY:  Past Surgical History  Procedure Laterality Date  . Abdominal hysterectomy  1980    partial  . Breast reduction surgery  1994  . Rectocele repair  1990; 09/12/2006  . Carpal tunnel release Right 1996  . Knee arthroscopy Right 01/1999; 10/2000  . Hemilaminotomy lumbar spine Bilateral 09/07/1999    L4-5  . Cholecystectomy    . Flexible sigmoidoscopy  01/17/2012    Procedure: FLEXIBLE SIGMOIDOSCOPY;  Surgeon: Rogene Houston, MD;  [facility];  Service: Endoscopy;  Laterality: N/A;  . Bilateral salpingoophorectomy  02/10/2001  . Tarsal tunnel release  2002  . Ureterolysis Right 02/10/2001  . Lysis of adhesion  02/10/2001  . Carpal tunnel release Left 03/21/2003  . 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 Esophagogastroduodenoscopy (egd) with esophageal dilation  12/02/2005  . Carpal tunnel release Right 05/04/2013    Procedure: CARPAL TUNNEL RELEASE;  Surgeon: Cammie Sickle., MD;  Location: [facility];  Service: Orthopedics;  Laterality: Right;    FAMILY HISTORY: family history includes Alcohol abuse in her other; Diabetes in her mother and son; Heart attack in her father; Heart disease in her father; Hypertension in her mother and son; Lung disease in her  father.  SOCIAL HISTORY:  reports that she has never smoked. She has never used smokeless tobacco. She reports that she does not drink alcohol or use illicit drugs.  REVIEW OF SYSTEMS:  As mentioned in the history of present illness  PHYSICAL EXAMINATION: ECOG PERFORMANCE STATUS: 0 - Asymptomatic  There were no vitals taken for this visit.  GENERAL:alert, no distress, well nourished and well developed SKIN: no rashes or significant lesions HEAD: Normocephalic EYES: PERRLA, EOMI, Conjunctiva are pink and non-injected, sclera clear EARS: External ears normal OROPHARYNX:no erythema, lips, buccal mucosa, and tongue normal and mucous membranes are moist  NECK: supple, no adenopathy, no JVD, no stridor, non-tender LYMPH:  no palpable lymphadenopathy, no hepatosplenomegaly BREAST:breasts appear normal, no suspicious masses, no skin or nipple changes or axillary nodes LUNGS: clear to auscultation ,  coarse sounds heard HEART: regular rate & rhythm ABDOMEN:abdomen soft, obese and normal bowel sounds BACK: Back symmetric, no curvature. No back tenderness. EXTREMITIES:no edema, no clubbing and no cyanosis  NEURO: alert & oriented x 3 with fluent speech, no focal motor/sensory deficits, gait normal   LABORATORY DATA: Infusion on 08/02/2013  Component Date Value Range Status  . WBC 08/02/2013 6.9  4.0 - 10.5 K/uL Final  . RBC 08/02/2013 4.39  3.87 - 5.11 MIL/uL Final  . Hemoglobin 08/02/2013 14.0  12.0 - 15.0 g/dL Final  . HCT 08/02/2013 40.7  36.0 - 46.0 % Final  . MCV 08/02/2013 92.7  78.0 - 100.0 fL Final  . MCH 08/02/2013 31.9  26.0 - 34.0 pg Final  . MCHC 08/02/2013 34.4  30.0 - 36.0 g/dL Final  . RDW 08/02/2013 12.3  11.5 - 15.5 % Final  . Platelets 08/02/2013 169  150 - 400 K/uL Final  . Neutrophils Relative % 08/02/2013 24* 43 - 77 % Final  . Neutro Abs 08/02/2013 1.6* 1.7 - 7.7 K/uL Final  . Lymphocytes Relative 08/02/2013 68* 12 - 46 % Final  . Lymphs Abs 08/02/2013 4.6* 0.7 -  4.0 K/uL Final  . Monocytes Relative 08/02/2013 7  3 - 12 % Final  . Monocytes Absolute 08/02/2013 0.5  0.1 - 1.0 K/uL Final  . Eosinophils Relative 08/02/2013 1  0 - 5 % Final  . Eosinophils Absolute 08/02/2013 0.1  0.0 - 0.7 K/uL Final  . Basophils Relative 08/02/2013 0  0 - 1 % Final  . Basophils Absolute 08/02/2013 0.0  0.0 - 0.1 K/uL Final    PATHOLOGY:  Urinalysis    Component Value Date/Time   COLORURINE YELLOW 01/11/2012 [place] 01/11/2012 2225   LABSPEC <1.005* 01/11/2012 2225   PHURINE 5.5 01/11/2012 2225   GLUCOSEU NEGATIVE 01/11/2012 2225   HGBUR NEGATIVE 01/11/2012 [place] 01/11/2012 2225   KETONESUR 15* 01/11/2012 2225   PROTEINUR NEGATIVE 01/11/2012 2225   UROBILINOGEN 0.2 01/11/2012 2225   NITRITE NEGATIVE 01/11/2012 2225   LEUKOCYTESUR NEGATIVE 01/11/2012 2225    ASSESSMENT: 1. Lymphocytosis most likely reactive process( inflammatory response) secondary to ulcerative colitis: ALC is improving on treatment for ulcerative colitis. Her peripheral blood flow cytometry was negative for any monoclonal process. We'll continue to monitor CBC and differential intermittently. If any progression of lymphocytosis than we'll plan for bone marrow aspiration and biopsy. 2. Back pain: Asked the patient to follow up with her primary care physician.   PLAN:  CBC and differential, CMP and peripheral blood smear prior next visit Follow up in 4 months Continue Remicade as scheduled Follow up with  gastroenterologist as scheduled   All questions were answered. The patient knows to call the clinic with any problems, questions or concerns. We can certainly see the patient much sooner if necessary.   I spent 15 minutes counseling the patient face to face. The total time spent in the appointment was 7mnutes   KWilmon Arms MD 08/09/2013 6:22 PM

## 2013-08-13 ENCOUNTER — Telehealth: Payer: Self-pay | Admitting: *Deleted

## 2013-08-13 NOTE — Telephone Encounter (Signed)
Pt called and lvm stating that Dr Loanne Drilling increased her insulin to 60 units before a meal. She states that her bg is still high. It has been over 200 and at times almost 300. Please advise to what pt should do, in Dr Cordelia Pen absence. [phone]). Thank you.

## 2013-08-13 NOTE — Telephone Encounter (Signed)
Please have her increase by 10 units with the meal before the time when sugars are elevated.

## 2013-08-16 ENCOUNTER — Ambulatory Visit (INDEPENDENT_AMBULATORY_CARE_PROVIDER_SITE_OTHER): Payer: Medicare Other | Admitting: Internal Medicine

## 2013-08-16 ENCOUNTER — Encounter (INDEPENDENT_AMBULATORY_CARE_PROVIDER_SITE_OTHER): Payer: Self-pay | Admitting: Internal Medicine

## 2013-08-16 VITALS — BP 130/78 | HR 74 | Temp 97.5°F | Resp 18 | Ht 61.0 in | Wt 198.4 lb

## 2013-08-16 DIAGNOSIS — R1310 Dysphagia, unspecified: Secondary | ICD-10-CM

## 2013-08-16 DIAGNOSIS — K589 Irritable bowel syndrome without diarrhea: Secondary | ICD-10-CM | POA: Diagnosis not present

## 2013-08-16 DIAGNOSIS — K5190 Ulcerative colitis, unspecified, without complications: Secondary | ICD-10-CM

## 2013-08-16 DIAGNOSIS — K219 Gastro-esophageal reflux disease without esophagitis: Secondary | ICD-10-CM | POA: Diagnosis not present

## 2013-08-16 NOTE — Progress Notes (Signed)
Presenting complaint;  Followup for ulcerative colitis and GERD.  Subjective:  Patient is 67 year old Caucasian female who is here for scheduled visit. She was last seen 6 months ago. She says she is doing well. On most days she has 1-2 formed stools. She has intermittent episodes of nonbloody diarrhea when she takes Imodium. She believes her average dose is too Imodium's per day. On some days she takes none and the worst that she may take as many as 4. She denies rectal bleeding melena or nocturnal bowel movements. She continues to complain of right scapular pain which radiates anteriorly which seem to be worse with physical activity. She is not having any side effects with Remicade other than fatigue for a day or two and mild knee pain. She is wondering when she could come off this medication. She states her heartburn is well controlled. She she takes anywhere from one to 2 doses of Nexium daily. She's been experiencing intermittent dysphagia both to liquids and solids over the last several weeks. She has not experienced any episode of food impaction.  Current Medications: Current Outpatient Prescriptions  Medication Sig Dispense Refill  . esomeprazole (NEXIUM) 40 MG capsule Take 1 capsule (40 mg total) by mouth 2 (two) times daily.  180 capsule  2  . Flora-Q (FLORA-Q) CAPS Take 1 capsule by mouth daily.  60 capsule  1  . furosemide (LASIX) 40 MG tablet Take 1 tablet (40 mg total) by mouth daily.  90 tablet  1  . glucose blood test strip Test 2 times daily as directed  100 each  10  . inFLIXimab (REMICADE) 100 MG injection Inject into the vein every 8 (eight) weeks.       . insulin aspart (NOVOLOG) 100 UNIT/ML SOPN FlexPen 3 times a day (just before each meal) 45-55-55 units, and pen needles 3/day  150 mL  3  . Insulin Pen Needle 31G X 5 MM MISC USE AS DIRECTED BY PHYSICIAN.  200 each  3  . KLOR-CON M10 10 MEQ tablet TAKE 2 TABLETS (20 MEQ) DAILY  180 tablet  1  . loperamide (IMODIUM) 2 MG  capsule Take 4 mg by mouth 2 (two) times daily.       . meclizine (ANTIVERT) 25 MG tablet Take 1 tablet (25 mg total) by mouth 3 (three) times daily as needed for dizziness or nausea.  30 tablet  0  . Melatonin 3 MG CAPS Take by mouth at bedtime.      . metoprolol succinate (TOPROL-XL) 100 MG 24 hr tablet Take 1 tablet (100 mg total) by mouth 2 (two) times daily.  180 tablet  3  . Naproxen Sodium (ALEVE PO) Take by mouth. Takes 2 Aleve PM @@ bedtime nightly      . ONE TOUCH LANCETS MISC PATIENT USES ONE TOUCH DELICAL 81W LANCETS. USE AS DIRECTED TWICE DAILY'; DIAGNOSIS CODE 250.02  200 each  6  . ONETOUCH DELICA LANCETS FINE MISC 2 each by Does not apply route 2 (two) times daily.  100 each  10  . ONETOUCH DELICA LANCETS MISC And lancets, twice daily dx 250.02      . venlafaxine XR (EFFEXOR-XR) 75 MG 24 hr capsule Take 1 capsule (75 mg total) by mouth daily.  90 capsule  3  . [DISCONTINUED] amitriptyline (ELAVIL) 25 MG tablet Take 1 tablet (25 mg total) by mouth at bedtime. start with 1/2 tab at night for 1 week then increase to 1 whole tab.  30 tablet  3  . [  DISCONTINUED] bromocriptine (PARLODEL) 2.5 MG tablet Take 2.5 mg by mouth 2 (two) times daily.        . [DISCONTINUED] potassium chloride (K-DUR) 10 MEQ tablet Take 2 tablets (20 mEq total) by mouth daily.  90 tablet  1   No current facility-administered medications for this visit.     Objective: Blood pressure 130/78, pulse 74, temperature 97.5 F (36.4 C), temperature source Oral, resp. rate 18, height 5' 1"  (1.549 m), weight 198 lb 6.4 oz (89.994 kg). Patient is alert and in no acute distress. Conjunctiva is pink. Sclera is nonicteric Oropharyngeal mucosa is normal. No neck masses or thyromegaly noted. Cardiac exam with regular rhythm normal S1 and S2. No murmur or gallop noted. Lungs are clear to auscultation. Abdomen is full but soft and nontender without organomegaly or masses.  No LE edema or clubbing noted.  Labs/studies  Results: Lab data from 08/02/2013. Flow cytometry was negative. WBC 6.9, H&H 14 and 40.7, platelet count 169K Neutrophils 24% and lymphocytes 68%      Assessment:  #1. Chronic ulcerative colitis. She remains in remission on single therapy which is Remicade or infliximab. Her risk of relapse is high if she was to come off this medication. Oral mesalamine and 6-MP has not worked in the past. #2. GERD. Heartburn is well controlled with therapy. #3. Dysphagia. #4. 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 She is being closely followed at oncology clinic.   Plan:  Barium pill esophagogram. Patient will continue Remicade and Nexium at current dose. She will continue to use Imodium on as-needed basis. Office visit in 4 months.

## 2013-08-16 NOTE — Telephone Encounter (Signed)
Called pt and advised her according to Dr Arman Filter advise. Pt stated Dr Loanne Drilling wants to decrease her insulin. Please advise further.

## 2013-08-16 NOTE — Telephone Encounter (Signed)
i've read these messages.  Please move up your ov to the next few days.  We'll address then.

## 2013-08-16 NOTE — Patient Instructions (Signed)
Please talk with primary care physician if you could take another medication in place of Aleve in order to sleep

## 2013-08-16 NOTE — Telephone Encounter (Signed)
Called pt and scheduled her for Tues, Nov 18th at 2:15. Be advised.

## 2013-08-17 ENCOUNTER — Ambulatory Visit (INDEPENDENT_AMBULATORY_CARE_PROVIDER_SITE_OTHER): Payer: Medicare Other | Admitting: Endocrinology

## 2013-08-17 ENCOUNTER — Encounter: Payer: Self-pay | Admitting: Endocrinology

## 2013-08-17 VITALS — BP 160/86 | HR 80 | Temp 98.4°F | Resp 16 | Ht 62.0 in | Wt 200.8 lb

## 2013-08-17 DIAGNOSIS — E1165 Type 2 diabetes mellitus with hyperglycemia: Secondary | ICD-10-CM

## 2013-08-17 MED ORDER — INSULIN ASPART 100 UNIT/ML FLEXPEN: 70.0000 [IU] | PEN_INJECTOR | Freq: Three times a day (TID) | SUBCUTANEOUS | Status: DC

## 2013-08-17 NOTE — Patient Instructions (Addendum)
check your blood sugar twice a day.  vary the time of day when you check, between before the 3 meals, and at bedtime.  also check if you have symptoms of your blood sugar being too high or too low.  please keep a record of the readings and bring it to your next appointment here.  please call us sooner if your blood sugar goes below 70, or if it stays over 200.   Please come back for a follow-up appointment in January.     Please increase the insulin to 70 units 3 times a day (just before each meal).

## 2013-08-17 NOTE — Progress Notes (Signed)
Subjective:    Patient ID: [name], female    DOB: 1946/01/13, 67 y.o.   MRN: [id number]  HPI Pt returns for f/u of type 2 DM (dx'ed 2007, on a routine blood test; he has mild if any neuropathy of the lower extremities; no other associated complications; she has been on insulin since 2013.  she has never had an episode of severe hypoglycemia or DKA).  Since last ov, she has not required any further steroid shots.  she brings a record of her cbg's which i have reviewed today.  It varies from 77-401.  It is in general higher as the day goes on.  She gets weekly remicade, but she can't relate that to the elevated cbg's.   Past Medical History  Diagnosis Date  . GERD   . Osteoarthritis   . HYPERTENSION   . IDDM (insulin dependent diabetes mellitus)   . Ulcerative colitis     Remicade infusion Q 8 weeks  . History of thrombocytopenia 12/2011  . 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 Dental bridge present     lower  . Anxiety     Past Surgical History  Procedure Laterality Date  . Abdominal hysterectomy  1980    partial  . Breast reduction surgery  1994  . Rectocele repair  1990; 09/12/2006  . Carpal tunnel release Right 1996  . Knee arthroscopy Right 01/1999; 10/2000  . Hemilaminotomy lumbar spine Bilateral 09/07/1999    L4-5  . Cholecystectomy    . Flexible sigmoidoscopy  01/17/2012    Procedure: FLEXIBLE SIGMOIDOSCOPY;  Surgeon: Rogene Houston, MD;  Location: [facility];  Service: Endoscopy;  Laterality: N/A;  . Bilateral salpingoophorectomy  02/10/2001  . Tarsal tunnel release  2002  . Ureterolysis Right 02/10/2001  . Lysis of adhesion  02/10/2001  . Carpal tunnel release Left 03/21/2003  . 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 Esophagogastroduodenoscopy (egd) with esophageal dilation  12/02/2005  . Carpal tunnel release Right 05/04/2013    Procedure: CARPAL TUNNEL RELEASE;  Surgeon: Cammie Sickle., MD;  Location: [facility];  Service: Orthopedics;  Laterality: Right;    History   Social History  . Marital Status: Married    Spouse Name: N/A    Number of Children: N/A  . Years of Education: N/A   Occupational History  . Not on file.   Social History Main Topics  . Smoking status: Never Smoker   . Smokeless tobacco: Never Used  . Alcohol Use: No  . Drug Use: No  . Sexual Activity: Yes    Partners: Male    Birth Control/ Protection: Surgical     Comment: hysterectomy   Other Topics Concern  . Not on file   Social History Narrative  . No narrative on file    Current Outpatient Prescriptions on File Prior to Visit  Medication Sig Dispense Refill  . esomeprazole (NEXIUM) 40 MG capsule Take 1 capsule (40 mg total) by mouth 2 (two) times daily.  180 capsule  2  . Flora-Q (FLORA-Q) CAPS Take 1 capsule by mouth daily.  60 capsule  1  . furosemide (LASIX) 40 MG tablet Take 1 tablet (40 mg total) by mouth daily.  90 tablet  1  . glucose blood test strip Test 2 times daily as directed  100 each  10  . inFLIXimab (REMICADE) 100 MG injection Inject into the  vein every 8 (eight) weeks.       . Insulin Pen Needle 31G X 5 MM MISC USE AS DIRECTED BY PHYSICIAN.  200 each  3  . KLOR-CON M10 10 MEQ tablet TAKE 2 TABLETS (20 MEQ) DAILY  180 tablet  1  . loperamide (IMODIUM) 2 MG capsule Take 4 mg by mouth 2 (two) times daily.       . meclizine (ANTIVERT) 25 MG tablet Take 1 tablet (25 mg total) by mouth 3 (three) times daily as needed for dizziness or nausea.  30 tablet  0  . Melatonin 3 MG CAPS Take by mouth at bedtime.      . metoprolol succinate (TOPROL-XL) 100 MG 24 hr tablet Take 1 tablet (100 mg total) by mouth 2 (two) times daily.  180 tablet  3  . Naproxen Sodium (ALEVE PO) Take by mouth. Takes 2 Aleve PM @@ bedtime nightly      . ONE TOUCH LANCETS MISC PATIENT USES ONE TOUCH DELICAL 44W LANCETS. USE AS DIRECTED TWICE DAILY'; DIAGNOSIS CODE 250.02  200 each  6  . ONETOUCH DELICA LANCETS  FINE MISC 2 each by Does not apply route 2 (two) times daily.  100 each  10  . ONETOUCH DELICA LANCETS MISC And lancets, twice daily dx 250.02      . venlafaxine XR (EFFEXOR-XR) 75 MG 24 hr capsule Take 1 capsule (75 mg total) by mouth daily.  90 capsule  3  . [DISCONTINUED] amitriptyline (ELAVIL) 25 MG tablet Take 1 tablet (25 mg total) by mouth at bedtime. start with 1/2 tab at night for 1 week then increase to 1 whole tab.  30 tablet  3  . [DISCONTINUED] bromocriptine (PARLODEL) 2.5 MG tablet Take 2.5 mg by mouth 2 (two) times daily.        . [DISCONTINUED] potassium chloride (K-DUR) 10 MEQ tablet Take 2 tablets (20 mEq total) by mouth daily.  90 tablet  1   No current facility-administered medications on file prior to visit.    Allergies  Allergen Reactions  . Actos [Pioglitazone] Other (See Comments)    EDEMA   . Benzocaine-Menthol Swelling    SWELLING OF MOUTH  . Colesevelam Other (See Comments)    GI UPSET  . Flagyl [Metronidazole Hcl] Other (See Comments)    DIAPHORESIS  . Metformin And Related Diarrhea  . Omeprazole Swelling    SWELLING OF TONGUE AND THROAT  . Shrimp [Shellfish Allergy] Itching    OF THROAT AND EARS  . Statins Other (See Comments)    HEART RACING  . Hydromorphone Itching  . Adhesive [Tape] Other (See Comments)    SKIN IRRITATION AND BRUISING  . Nisoldipine Itching  . Percocet [Oxycodone-Acetaminophen] Itching   Family History  Problem Relation Age of Onset  . Diabetes Mother   . Hypertension Mother   . Heart attack Father     Mid 74's  . Heart disease Father   . Lung disease Father     spot on lung; had lung surgery  . Alcohol abuse Other   . Hypertension Son   . Diabetes Son    BP 160/86  Pulse 80  Temp(Src) 98.4 F (36.9 C) (Oral)  Resp 16  Ht 5' 2"  (1.575 m)  Wt 200 lb 12.8 oz (91.082 kg)  BMI 36.72 kg/m2  Review of Systems denies hypoglycemia.  She has weight gain.      Objective:   Physical Exam VITAL SIGNS:  See vs  page GENERAL: no  distress      Assessment & Plan:  DM: This insulin regimen was chosen from multiple options, as it best matches her insulin to her changing requirements throughout the day.  The benefits of glycemic control must be weighed against the risks of hypoglycemia.  She needs increased rx. Weight gain: this limits the rx of DM.

## 2013-08-19 ENCOUNTER — Ambulatory Visit (HOSPITAL_COMMUNITY)
Admission: RE | Admit: 2013-08-19 | Discharge: 2013-08-19 | Disposition: A | Discharge status: Home / Self Care | Payer: Medicare Other | Source: Ambulatory Visit | Attending: Internal Medicine | Admitting: Internal Medicine

## 2013-08-19 DIAGNOSIS — R1310 Dysphagia, unspecified: Secondary | ICD-10-CM | POA: Diagnosis not present

## 2013-08-19 DIAGNOSIS — K228 Other specified diseases of esophagus: Secondary | ICD-10-CM | POA: Insufficient documentation

## 2013-08-19 DIAGNOSIS — M5380 Other specified dorsopathies, site unspecified: Secondary | ICD-10-CM | POA: Insufficient documentation

## 2013-08-19 DIAGNOSIS — K2289 Other specified disease of esophagus: Secondary | ICD-10-CM | POA: Insufficient documentation

## 2013-08-20 ENCOUNTER — Other Ambulatory Visit: Payer: Self-pay | Admitting: *Deleted

## 2013-08-20 MED ORDER — GLUCOSE BLOOD VI STRP: ORAL_STRIP | Status: DC

## 2013-08-23 ENCOUNTER — Encounter (HOSPITAL_COMMUNITY)
Admission: RE | Admit: 2013-08-23 | Discharge: 2013-08-23 | Disposition: A | Discharge status: Home / Self Care | Payer: Medicare Other | Source: Ambulatory Visit | Attending: Internal Medicine | Admitting: Internal Medicine

## 2013-08-23 DIAGNOSIS — K5190 Ulcerative colitis, unspecified, without complications: Secondary | ICD-10-CM | POA: Diagnosis not present

## 2013-08-23 MED ORDER — SODIUM CHLORIDE 0.9 % IV SOLN
INTRAVENOUS | Status: DC
  Administered 2013-08-23: 12:00:00 via INTRAVENOUS

## 2013-08-23 MED ORDER — SODIUM CHLORIDE 0.9 % IV SOLN
5.0000 mg/kg | INTRAVENOUS | Status: DC
  Administered 2013-08-23: 500 mg via INTRAVENOUS
  Filled 2013-08-23: qty 50

## 2013-08-24 ENCOUNTER — Other Ambulatory Visit (HOSPITAL_COMMUNITY): Payer: Medicare Other

## 2013-08-28 ENCOUNTER — Other Ambulatory Visit (HOSPITAL_COMMUNITY): Payer: Medicare Other

## 2013-09-01 DIAGNOSIS — G5600 Carpal tunnel syndrome, unspecified upper limb: Secondary | ICD-10-CM | POA: Diagnosis not present

## 2013-09-07 ENCOUNTER — Ambulatory Visit: Payer: Medicare Other | Admitting: Endocrinology

## 2013-09-08 ENCOUNTER — Ambulatory Visit: Payer: BC Managed Care – PPO | Admitting: Podiatry

## 2013-09-09 ENCOUNTER — Ambulatory Visit (INDEPENDENT_AMBULATORY_CARE_PROVIDER_SITE_OTHER): Payer: Medicare Other | Admitting: Podiatry

## 2013-09-09 ENCOUNTER — Encounter: Payer: Self-pay | Admitting: Podiatry

## 2013-09-09 VITALS — BP 144/65 | HR 82 | Resp 12 | Ht 62.0 in | Wt 192.0 lb

## 2013-09-09 DIAGNOSIS — L600 Ingrowing nail: Secondary | ICD-10-CM | POA: Diagnosis not present

## 2013-09-09 MED ORDER — NEOMYCIN-POLYMYXIN-HC 3.5-10000-1 OT SOLN: OTIC | Status: DC

## 2013-09-09 NOTE — Patient Instructions (Signed)

## 2013-09-09 NOTE — Progress Notes (Signed)
   Subjective:    Patient ID: [name], female    DOB: [birth year], 67 y.o.   MRN: [id number]  HPI Comments: '' BOTH BIG TOENAIL ARE SORE, ESPECIALLY TIP OF THE TOENAIL. HAVE INGROWN IN THE PAST AND GOT THEM FIX. THEY START  HURTING FOR AT 6 MONTHS. BUT TRIED NO TREATMENT.''     Review of Systems  Musculoskeletal: Positive for gait problem and joint swelling.  Neurological: Positive for weakness and numbness.       Objective:   Physical Exam: I have reviewed her past medical history medications allergies surgeries social history. Currently review of systems diabetes mellitus his only concern associated with current complaint. Vascular evaluation demonstrates strong palpable pulses bilateral. Capillary fill time to digits one through 5 bilateral is immediate. Neurologic sensorium is intact per Semmes-Weinstein monofilament. Deep tendon reflexes are intact bilateral. Muscle strength is 5 over 5 dorsiflexors plantar flexors inverters and everters all intrinsic musculature is intact. Orthopedic evaluation demonstrates all joints distal to the ankle a full range of motion without crepitus cutaneous evaluation demonstrates supple well hydrated cutis sharp incurvated nail margins tibial and fibular border of the hallux bilaterally with erythema and pain on palpation.        Assessment & Plan:  Assessment: Diabetes mellitus. Painful ingrown nails tibial fibular border of the hallux bilateral.  Plan: Partial matrixectomy tibial fibular border of the hallux bilaterally. This is performed after 3 cc of 50-50 mixture Marcaine plain lidocaine plain was infiltrated in a hallux block bilateral. The toes were prepped with Betadine. The nail borders were split from distal to proximal avulsed exposing the matrix and nailbed. 3 applications of phenol were applied to the matrix and nailbed 30 seconds each. They were neutralized with isopropyl alcohol Silvadene cream Telfa pad and a dry sterile compressive  dressing was applied. She was given a prescription for Cortisporin Otic as well as directions for soaking twice a day. I will followup with her in one week.

## 2013-09-14 ENCOUNTER — Ambulatory Visit (INDEPENDENT_AMBULATORY_CARE_PROVIDER_SITE_OTHER): Payer: Medicare Other | Admitting: Podiatry

## 2013-09-14 ENCOUNTER — Encounter: Payer: Self-pay | Admitting: Podiatry

## 2013-09-14 ENCOUNTER — Other Ambulatory Visit: Payer: Self-pay | Admitting: Orthopedic Surgery

## 2013-09-14 VITALS — BP 136/71 | HR 84 | Resp 12

## 2013-09-14 DIAGNOSIS — Z9889 Other specified postprocedural states: Secondary | ICD-10-CM

## 2013-09-14 NOTE — Progress Notes (Signed)
   Subjective:    Patient ID: [name], female    DOB: 1946/08/15, 67 y.o.   MRN: [id number]  HPI Comments: '' BOTH BIG TOENAILS DOING MUCH BETTER.''     Review of Systems     Objective:   Physical Exam: Pulses are strongly palpable bilateral. Followup matrixectomy x1 week demonstrates no erythema edema cellulitis change or odor. Should be healing very nicely.        Assessment & Plan:  Assessment: Well-healing matrixectomy hallux bilateral. Continue to soak Epsom salts and water followup with me on an as-needed basis.

## 2013-09-15 ENCOUNTER — Other Ambulatory Visit: Payer: Self-pay | Admitting: Obstetrics & Gynecology

## 2013-09-15 DIAGNOSIS — R928 Other abnormal and inconclusive findings on diagnostic imaging of breast: Secondary | ICD-10-CM

## 2013-09-15 DIAGNOSIS — Z09 Encounter for follow-up examination after completed treatment for conditions other than malignant neoplasm: Secondary | ICD-10-CM

## 2013-09-17 ENCOUNTER — Encounter (HOSPITAL_BASED_OUTPATIENT_CLINIC_OR_DEPARTMENT_OTHER): Payer: Self-pay | Admitting: *Deleted

## 2013-09-17 NOTE — Progress Notes (Signed)
pts blood sugars run 150-200 am-no insulin am surgery but bring all meds and insulin with her-going to [facility] for [facility]

## 2013-09-20 NOTE — H&P (Signed)
[name] is an 67 y.o. female.   Chief Complaint: c/o chronic and progressive numbness and tingling of the left hand HPI: [name] presented for evaluation of bilateral hand discomfort and episodic numbness. [name] is s/p bilateral carpal tunnel release surgery with our office in the late 1980's. Since that time she has developed type II diabetes.She is retired from the [employer]. She now reports numbness in all fingers, primarily in the atrial segments, thumb, index, long, ring and small fingers virtually every night  Past Medical History  Diagnosis Date  . GERD   . Osteoarthritis   . HYPERTENSION   . IDDM (insulin dependent diabetes mellitus)   . Ulcerative colitis     Remicade infusion Q 8 weeks  . History of thrombocytopenia 12/2011  . 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 Dental bridge present     lower  . Anxiety   . Wears glasses     reading  . PONV (postoperative nausea and vomiting)     Past Surgical History  Procedure Laterality Date  . Abdominal hysterectomy  1980    partial  . Breast reduction surgery  1994  . Rectocele repair  1990; 09/12/2006  . Carpal tunnel release Right 1996  . Knee arthroscopy Right 01/1999; 10/2000  . Hemilaminotomy lumbar spine Bilateral 09/07/1999    L4-5  . Cholecystectomy    . Flexible sigmoidoscopy  01/17/2012    Procedure: FLEXIBLE SIGMOIDOSCOPY;  Surgeon: Rogene Houston, MD;  Location: [facility];  Service: Endoscopy;  Laterality: N/A;  . Bilateral salpingoophorectomy  02/10/2001  . Tarsal tunnel release  2002  . Ureterolysis Right 02/10/2001  . Lysis of adhesion  02/10/2001  . Carpal tunnel release Left 03/21/2003  . 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 Esophagogastroduodenoscopy (egd) with esophageal dilation  12/02/2005  . Carpal tunnel release Right 05/04/2013    Procedure: CARPAL TUNNEL RELEASE;  Surgeon: Cammie Sickle., MD;  Location: [facility];  Service: Orthopedics;  Laterality: Right;    Family History  Problem Relation Age of Onset  . Diabetes Mother   . Hypertension Mother   . Heart attack Father     Mid 29's  . Heart disease Father   . Lung disease Father     spot on lung; had lung surgery  . Alcohol abuse Other   . Hypertension Son   . Diabetes Son    Social History:  reports that she has never smoked. She has never used smokeless tobacco. She reports that she does not drink alcohol or use illicit drugs.  Allergies:  Allergies  Allergen Reactions  . Actos [Pioglitazone] Other (See Comments)    EDEMA   . Benzocaine-Menthol Swelling    SWELLING OF MOUTH  . Colesevelam Other (See Comments)    GI UPSET  . Flagyl [Metronidazole Hcl] Other (See Comments)    DIAPHORESIS  . Metformin And Related Diarrhea  . Omeprazole Swelling    SWELLING OF TONGUE AND THROAT  . Shrimp [Shellfish Allergy] Itching    OF THROAT AND EARS  . Statins Other (See Comments)    HEART RACING  . Hydromorphone Itching  . Adhesive [Tape] Other (See Comments)    SKIN IRRITATION AND BRUISING  . Nisoldipine Itching  . Percocet [Oxycodone-Acetaminophen] Itching    No prescriptions prior to admission    No results found for this or any previous  visit (from the past 48 hour(s)).  No results found.   Pertinent items are noted in HPI.  Height 5' 2"  (1.575 m), weight 87.091 kg (192 lb).  General appearance: alert Head: Normocephalic, without obvious abnormality Neck: WNL Resp: clear to auscultation bilaterally Cardio: regular rate and rhythm GI: normal findings: bowel sounds normal Extremities:. Inspection of her hands reveals normal musculature. Her dermatoglyphics are preserved but her sweat patterns are diminished. She has a positive wrist flexion test at 45 seconds reproducing numbness in the thumb, index, and long finger bilaterally. She has a negative hyperflexion test at 1 minute. She has pain with motion of her  cervical spine in all directions but no radicular signs.    Dr. Nelva Bush' electrodiagnostic study of 07-06-12. This shows borderline sensory changes.  Pulses: 2+ and symmetric Skin: normal Neurologic: Grossly normal    Assessment/Plan Impression:  Left CTS  Plan: TO the OR for left CTR.The procedure, risks,benefits and post-op course were discussed with the patient at length and they were in agreement with the plan.  DASNOIT,[name] 09/20/2013, 3:13 PM  H&P documentation: 09/21/2013  -History and Physical Reviewed  -Patient has been re-examined  -No change in the plan of care  Cammie Sickle, MD

## 2013-09-21 ENCOUNTER — Encounter (HOSPITAL_BASED_OUTPATIENT_CLINIC_OR_DEPARTMENT_OTHER)
Admission: RE | Disposition: A | Discharge status: Home / Self Care | Payer: Self-pay | Source: Ambulatory Visit | Attending: Orthopedic Surgery

## 2013-09-21 ENCOUNTER — Encounter (HOSPITAL_BASED_OUTPATIENT_CLINIC_OR_DEPARTMENT_OTHER): Payer: Medicare Other | Admitting: Anesthesiology

## 2013-09-21 ENCOUNTER — Ambulatory Visit (HOSPITAL_BASED_OUTPATIENT_CLINIC_OR_DEPARTMENT_OTHER)
Admission: RE | Admit: 2013-09-21 | Discharge: 2013-09-21 | Disposition: A | Discharge status: Home / Self Care | Payer: Medicare Other | Source: Ambulatory Visit | Attending: Orthopedic Surgery | Admitting: Orthopedic Surgery

## 2013-09-21 ENCOUNTER — Ambulatory Visit (HOSPITAL_BASED_OUTPATIENT_CLINIC_OR_DEPARTMENT_OTHER): Payer: Medicare Other | Admitting: Anesthesiology

## 2013-09-21 ENCOUNTER — Encounter (HOSPITAL_BASED_OUTPATIENT_CLINIC_OR_DEPARTMENT_OTHER): Payer: Self-pay | Admitting: *Deleted

## 2013-09-21 DIAGNOSIS — E1142 Type 2 diabetes mellitus with diabetic polyneuropathy: Secondary | ICD-10-CM | POA: Insufficient documentation

## 2013-09-21 DIAGNOSIS — Z794 Long term (current) use of insulin: Secondary | ICD-10-CM | POA: Diagnosis not present

## 2013-09-21 DIAGNOSIS — M1990 Unspecified osteoarthritis, unspecified site: Secondary | ICD-10-CM | POA: Diagnosis not present

## 2013-09-21 DIAGNOSIS — K219 Gastro-esophageal reflux disease without esophagitis: Secondary | ICD-10-CM | POA: Diagnosis not present

## 2013-09-21 DIAGNOSIS — E1149 Type 2 diabetes mellitus with other diabetic neurological complication: Secondary | ICD-10-CM | POA: Diagnosis not present

## 2013-09-21 DIAGNOSIS — I10 Essential (primary) hypertension: Secondary | ICD-10-CM | POA: Diagnosis not present

## 2013-09-21 DIAGNOSIS — G5600 Carpal tunnel syndrome, unspecified upper limb: Secondary | ICD-10-CM | POA: Diagnosis not present

## 2013-09-21 HISTORY — PX: CARPAL TUNNEL RELEASE: SHX101

## 2013-09-21 LAB — POCT I-STAT, CHEM 8
BUN: 13 mg/dL (ref 6–23)
Calcium, Ion: 1.18 mmol/L (ref 1.13–1.30)
Chloride: 99 mEq/L (ref 96–112)
Creatinine, Ser: 0.7 mg/dL (ref 0.50–1.10)
Glucose, Bld: 227 mg/dL — ABNORMAL HIGH (ref 70–99)
HCT: 43 % (ref 36.0–46.0)
Hemoglobin: 14.6 g/dL (ref 12.0–15.0)
Potassium: 3.8 mEq/L (ref 3.5–5.1)
TCO2: 26 mmol/L (ref 0–100)

## 2013-09-21 SURGERY — CARPAL TUNNEL RELEASE
Anesthesia: General | Site: Wrist | Laterality: Left

## 2013-09-21 MED ORDER — LACTATED RINGERS IV SOLN
INTRAVENOUS | Status: DC
  Administered 2013-09-21 (×2): via INTRAVENOUS

## 2013-09-21 MED ORDER — LIDOCAINE HCL 2 % IJ SOLN
INTRAMUSCULAR | Status: DC | PRN
  Administered 2013-09-21: 5 mL

## 2013-09-21 MED ORDER — MIDAZOLAM HCL 2 MG/2ML IJ SOLN
INTRAMUSCULAR | Status: AC
  Filled 2013-09-21: qty 2

## 2013-09-21 MED ORDER — MEPERIDINE HCL 25 MG/ML IJ SOLN: 6.2500 mg | INTRAMUSCULAR | Status: DC | PRN

## 2013-09-21 MED ORDER — OXYCODONE HCL 5 MG PO TABS: 5.0000 mg | ORAL_TABLET | Freq: Once | ORAL | Status: DC | PRN

## 2013-09-21 MED ORDER — LIDOCAINE HCL (CARDIAC) 20 MG/ML IV SOLN
INTRAVENOUS | Status: DC | PRN
  Administered 2013-09-21: 80 mg via INTRAVENOUS

## 2013-09-21 MED ORDER — TRAMADOL HCL 50 MG PO TABS: ORAL_TABLET | ORAL | Status: DC

## 2013-09-21 MED ORDER — ONDANSETRON HCL 4 MG/2ML IJ SOLN: 4.0000 mg | Freq: Once | INTRAMUSCULAR | Status: DC | PRN

## 2013-09-21 MED ORDER — FENTANYL CITRATE 0.05 MG/ML IJ SOLN
INTRAMUSCULAR | Status: DC | PRN
  Administered 2013-09-21: 100 ug via INTRAVENOUS
  Administered 2013-09-21 (×2): 50 ug via INTRAVENOUS

## 2013-09-21 MED ORDER — FENTANYL CITRATE 0.05 MG/ML IJ SOLN
INTRAMUSCULAR | Status: AC
  Filled 2013-09-21: qty 2

## 2013-09-21 MED ORDER — CHLORHEXIDINE GLUCONATE 4 % EX LIQD: 60.0000 mL | Freq: Once | CUTANEOUS | Status: DC

## 2013-09-21 MED ORDER — ONDANSETRON HCL 4 MG/2ML IJ SOLN
INTRAMUSCULAR | Status: DC | PRN
  Administered 2013-09-21: 4 mg via INTRAVENOUS

## 2013-09-21 MED ORDER — MIDAZOLAM HCL 5 MG/5ML IJ SOLN
INTRAMUSCULAR | Status: DC | PRN
  Administered 2013-09-21: 2 mg via INTRAVENOUS

## 2013-09-21 MED ORDER — DEXAMETHASONE SODIUM PHOSPHATE 10 MG/ML IJ SOLN
INTRAMUSCULAR | Status: DC | PRN
  Administered 2013-09-21: 5 mg via INTRAVENOUS

## 2013-09-21 MED ORDER — FENTANYL CITRATE 0.05 MG/ML IJ SOLN: 50.0000 ug | INTRAMUSCULAR | Status: DC | PRN

## 2013-09-21 MED ORDER — PROPOFOL 10 MG/ML IV BOLUS
INTRAVENOUS | Status: DC | PRN
  Administered 2013-09-21: 160 mg via INTRAVENOUS
  Administered 2013-09-21: 20 mg via INTRAVENOUS

## 2013-09-21 MED ORDER — HYDROMORPHONE HCL PF 1 MG/ML IJ SOLN: 0.2500 mg | INTRAMUSCULAR | Status: DC | PRN

## 2013-09-21 MED ORDER — MIDAZOLAM HCL 2 MG/2ML IJ SOLN: 1.0000 mg | INTRAMUSCULAR | Status: DC | PRN

## 2013-09-21 MED ORDER — OXYCODONE HCL 5 MG/5ML PO SOLN: 5.0000 mg | Freq: Once | ORAL | Status: DC | PRN

## 2013-09-21 SURGICAL SUPPLY — 39 items
BANDAGE ADH SHEER 1  50/CT (GAUZE/BANDAGES/DRESSINGS) IMPLANT
BANDAGE ELASTIC 3 VELCRO ST LF (GAUZE/BANDAGES/DRESSINGS) ×2 IMPLANT
BLADE SURG 15 STRL LF DISP TIS (BLADE) ×1 IMPLANT
BLADE SURG 15 STRL SS (BLADE) ×2
BNDG CMPR 9X4 STRL LF SNTH (GAUZE/BANDAGES/DRESSINGS) ×1
BNDG COHESIVE 3X5 TAN STRL LF (GAUZE/BANDAGES/DRESSINGS) IMPLANT
BNDG ESMARK 4X9 LF (GAUZE/BANDAGES/DRESSINGS) ×1 IMPLANT
BRUSH SCRUB EZ PLAIN DRY (MISCELLANEOUS) ×2 IMPLANT
CORDS BIPOLAR (ELECTRODE) ×1 IMPLANT
COVER MAYO STAND STRL (DRAPES) ×2 IMPLANT
COVER TABLE BACK 60X90 (DRAPES) ×2 IMPLANT
CUFF TOURNIQUET SINGLE 18IN (TOURNIQUET CUFF) IMPLANT
CUFF TOURNIQUET SINGLE 24IN (TOURNIQUET CUFF) ×1 IMPLANT
DECANTER SPIKE VIAL GLASS SM (MISCELLANEOUS) IMPLANT
DRAPE EXTREMITY T 121X128X90 (DRAPE) ×2 IMPLANT
DRAPE SURG 17X23 STRL (DRAPES) ×2 IMPLANT
GLOVE BIO SURGEON STRL SZ7 (GLOVE) ×1 IMPLANT
GLOVE BIOGEL M STRL SZ7.5 (GLOVE) ×2 IMPLANT
GLOVE ORTHO TXT STRL SZ7.5 (GLOVE) ×2 IMPLANT
GOWN BRE IMP PREV XXLGXLNG (GOWN DISPOSABLE) ×4 IMPLANT
GOWN PREVENTION PLUS XLARGE (GOWN DISPOSABLE) ×1 IMPLANT
GOWN PREVENTION PLUS XXLARGE (GOWN DISPOSABLE) ×1 IMPLANT
NEEDLE 27GAX1X1/2 (NEEDLE) ×1 IMPLANT
PACK BASIN DAY SURGERY FS (CUSTOM PROCEDURE TRAY) ×2 IMPLANT
PAD CAST 3X4 CTTN HI CHSV (CAST SUPPLIES) ×1 IMPLANT
PADDING CAST ABS 4INX4YD NS (CAST SUPPLIES) ×1
PADDING CAST ABS COTTON 4X4 ST (CAST SUPPLIES) ×1 IMPLANT
PADDING CAST COTTON 3X4 STRL (CAST SUPPLIES) ×2
SPLINT PLASTER CAST XFAST 3X15 (CAST SUPPLIES) ×5 IMPLANT
SPLINT PLASTER XTRA FASTSET 3X (CAST SUPPLIES) ×5
SPONGE GAUZE 4X4 12PLY (GAUZE/BANDAGES/DRESSINGS) ×2 IMPLANT
STOCKINETTE 4X48 STRL (DRAPES) ×2 IMPLANT
STRIP CLOSURE SKIN 1/2X4 (GAUZE/BANDAGES/DRESSINGS) ×2 IMPLANT
SUT PROLENE 3 0 PS 2 (SUTURE) ×2 IMPLANT
SYR 3ML 23GX1 SAFETY (SYRINGE) IMPLANT
SYR CONTROL 10ML LL (SYRINGE) ×1 IMPLANT
TOWEL OR 17X24 6PK STRL BLUE (TOWEL DISPOSABLE) ×2 IMPLANT
TRAY DSU PREP LF (CUSTOM PROCEDURE TRAY) ×2 IMPLANT
UNDERPAD 30X30 INCONTINENT (UNDERPADS AND DIAPERS) ×2 IMPLANT

## 2013-09-21 NOTE — Anesthesia Preprocedure Evaluation (Signed)
Anesthesia Evaluation  Patient identified by MRN, date of birth, ID band Patient awake    Reviewed: Allergy & Precautions, H&P , NPO status , Patient's Chart, lab work & pertinent test results  History of Anesthesia Complications (+) PONV  Airway Mallampati: I TM Distance: >3 FB Neck ROM: Full    Dental   Pulmonary          Cardiovascular hypertension, Pt. on medications     Neuro/Psych Anxiety    GI/Hepatic GERD-  Medicated and Controlled,  Endo/Other  diabetes, Well Controlled, Type 2, Insulin Dependent  Renal/GU      Musculoskeletal   Abdominal   Peds  Hematology   Anesthesia Other Findings   Reproductive/Obstetrics                           Anesthesia Physical Anesthesia Plan  ASA: III  Anesthesia Plan: General   Post-op Pain Management:    Induction: Intravenous  Airway Management Planned: LMA  Additional Equipment:   Intra-op Plan:   Post-operative Plan: Extubation in OR  Informed Consent:   Plan Discussed with: CRNA and Surgeon  Anesthesia Plan Comments:         Anesthesia Quick Evaluation

## 2013-09-21 NOTE — Brief Op Note (Signed)
09/21/2013  1:13 PM  PATIENT:  [name]  67 y.o. female  PRE-OPERATIVE DIAGNOSIS:  LEFT CARPAL TUNNEL SYNDROME  POST-OPERATIVE DIAGNOSIS:  LEFT CARPAL TUNNEL SYNDROME  PROCEDURE:  Procedure(s): LEFT CARPAL TUNNEL RELEASE (Left)  SURGEON:  Surgeon(s) and Role:    * Cammie Sickle., MD - Primary  PHYSICIAN ASSISTANT:   ASSISTANTS: Kathyrn Sheriff.A-C   ANESTHESIA:   general  EBL:  Total I/O In: 800 [I.V.:800] Out: -   BLOOD ADMINISTERED:none  DRAINS: none   LOCAL MEDICATIONS USED:  XYLOCAINE   SPECIMEN:  No Specimen  DISPOSITION OF SPECIMEN:  N/A  COUNTS:  YES  TOURNIQUET:   Total Tourniquet Time Documented: Upper Arm (Left) - 19 minutes Total: Upper Arm (Left) - 19 minutes   DICTATION: .Other Dictation: Dictation Number [id number]  PLAN OF CARE: Discharge to home after PACU  PATIENT DISPOSITION:  PACU - hemodynamically stable.   Delay start of Pharmacological VTE agent (>24hrs) due to surgical blood loss or risk of bleeding: not applicable

## 2013-09-21 NOTE — Op Note (Signed)
NAMECOLLIER, Jennifer                 ACCOUNT NO.:  [id number]  MEDICAL RECORD NO.:  [id number]  LOCATION:                                 FACILITY:  PHYSICIAN:  Youlanda Mighty. Ethanael Veith, M.D. 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  OPERATING SURGEON:  Youlanda Mighty. Mykaela Arena, M.D.  ASSISTANT:  Marily Lente Dasnoit, PA-C  ANESTHESIA:  General by LMA.  SUPERVISING ANESTHESIOLOGIST:  Crissie Sickles. Conrad Seymour, MD  INDICATIONS:  [name] is a 67 year old woman referred by Dr. Asa Lente for evaluation and management of persistent hand numbness.  [name] has had type 2 diabetes for many years and tends to run a relatively high sugar.  [name] has had some elements of diabetic neuropathy.  Years ago, [name] had bilateral carpal tunnel release surgery with good results for more than a decade.  [name] then developed recurrent numbness.  [name] is status post decompression of [name] right median nerve in the summer of 2014, with significant improvement in [name] sensibility.  [name] now requests identical surgery on the left.  Preoperative electrodiagnostic study did reveal prolonged sensory latencies and distal latencies bilaterally in [name] median nerves prior to surgery.  Questions regarding the anticipated procedure were invited and answered in detail.  PROCEDURE:  [name] was interviewed by Dr. Conrad Currituck in the holding area and after detailed anesthesia informed consent, selective general anesthesia by LMA technique.  [name] left hand was marked  as the proper surgical site per protocol in the holding area with the marking pen.  [name] was brought to room 6 of the [facility], where under Dr. Everlene Other direct supervision, general anesthesia by LMA technique was induced.  The left hand and arm were prepped with Betadine soap and solution, sterilely draped.  A pneumatic tourniquet was applied to the proximal brachium.  Following exsanguination of the left arm with Esmarch bandage, arterial tourniquet was inflated to 220 mmHg.  Following routine surgical time-out, procedure commenced with resection of the prior surgical scar.  Subcutaneous tissues were carefully divided revealing the palmar fascia.  This was split longitudinally to a very atypical mid palmar muscle.  This had a muscle belly that was at the distal margin of the transverse carpal ligament and was deep to the superficial palmar arch.  There was a large communicating branch between the ulnar nerve and median nerve.  Common sensory branch to the ring and long fingers that was carefully preserved.  The transverse carpal ligament was identified proximally and released to the carpal canal.  There was a thick band of scar at the distal wrist flexion crease.  Therefore, for safety given the fact that [name] has abundant adipose tissue subcutaneously, we performed a transverse incision at the distal wrist flexion crease, and carefully released  the volar forearm fascia and this band of scar/ligament following the prior surgery.  The median nerve was widely decompressed from the mid palm to the distal forearm.  No mass was noted.  The atypical muscle was preserved throughout dissection.  Bleeding points were electrocauterized with bipolar current, followed by repair of the 2 wounds with intradermal 3-0 Prolene and Steri-Strips.  A 2% lidocaine was infiltrated for postoperative analgesia.  For aftercare, [name] has brought a prescription for tramadol 50 mg 1 p.o.  q.4-6 hours p.r.n. pain, 24 tablets without refill.  [name] will also use Tylenol for milder pain.     Youlanda Mighty Iveliz Garay, M.D.     RVS/MEDQ  D:  09/21/2013  T:  09/21/2013  Job:  [id number]  cc:   Mateo Flow A. Asa Lente, MD

## 2013-09-21 NOTE — Anesthesia Procedure Notes (Signed)
Procedure Name: LMA Insertion Date/Time: 09/21/2013 12:41 PM Performed by: Maryella Shivers Pre-anesthesia Checklist: Patient identified, Emergency Drugs available, Suction available and Patient being monitored Patient Re-evaluated:Patient Re-evaluated prior to inductionOxygen Delivery Method: Circle System Utilized Preoxygenation: Pre-oxygenation with 100% oxygen Intubation Type: IV induction Ventilation: Mask ventilation without difficulty LMA: LMA inserted LMA Size: 4.0 Number of attempts: 1 Airway Equipment and Method: bite block Placement Confirmation: positive ETCO2 Tube secured with: Tape Dental Injury: Teeth and Oropharynx as per pre-operative assessment

## 2013-09-21 NOTE — Transfer of Care (Signed)
Immediate Anesthesia Transfer of Care Note  Patient: [name]  Procedure(s) Performed: Procedure(s): LEFT CARPAL TUNNEL RELEASE (Left)  Patient Location: PACU  Anesthesia Type:General  Level of Consciousness: sedated  Airway & Oxygen Therapy: Patient Spontanous Breathing and Patient connected to face mask oxygen  Post-op Assessment: Report given to PACU RN and Post -op Vital signs reviewed and stable  Post vital signs: Reviewed and stable  Complications: No apparent anesthesia complications

## 2013-09-21 NOTE — Op Note (Signed)
[id number] 

## 2013-09-21 NOTE — Anesthesia Postprocedure Evaluation (Signed)
Anesthesia Post Note  Patient: [name]  Procedure(s) Performed: Procedure(s) (LRB): LEFT CARPAL TUNNEL RELEASE (Left)  Anesthesia type: general  Patient location: PACU  Post pain: Pain level controlled  Post assessment: Patient's Cardiovascular Status Stable  Last Vitals:  Filed Vitals:   09/21/13 1440  BP: 157/80  Pulse: 78  Temp: 36.8 C  Resp: 16    Post vital signs: Reviewed and stable  Level of consciousness: sedated  Complications: No apparent anesthesia complications

## 2013-09-27 ENCOUNTER — Encounter (HOSPITAL_BASED_OUTPATIENT_CLINIC_OR_DEPARTMENT_OTHER): Payer: Self-pay | Admitting: Orthopedic Surgery

## 2013-10-04 ENCOUNTER — Other Ambulatory Visit: Payer: Self-pay

## 2013-10-04 ENCOUNTER — Telehealth: Payer: Self-pay

## 2013-10-04 MED ORDER — INSULIN ASPART 100 UNIT/ML FLEXPEN: 90.0000 [IU] | PEN_INJECTOR | Freq: Three times a day (TID) | SUBCUTANEOUS | Status: DC

## 2013-10-04 NOTE — Telephone Encounter (Signed)
Please increase insulin to 90 units 3 times a day (just before each meal).

## 2013-10-04 NOTE — Telephone Encounter (Signed)
Pt called stating that her blood sugar has been running high its been in the 250-300. Pt sates that she does not feel like the insulin in helping her and wanted to know what she should do because she is concerned with the readings and not being able to stabilize her sugars. Please advise, Thanks!

## 2013-10-04 NOTE — Telephone Encounter (Signed)
Pt informed

## 2013-10-06 ENCOUNTER — Other Ambulatory Visit: Payer: Self-pay | Admitting: Internal Medicine

## 2013-10-18 ENCOUNTER — Encounter (HOSPITAL_COMMUNITY)
Admission: RE | Admit: 2013-10-18 | Discharge: 2013-10-18 | Disposition: A | Discharge status: Home / Self Care | Payer: Medicare Other | Source: Ambulatory Visit | Attending: Internal Medicine | Admitting: Internal Medicine

## 2013-10-18 DIAGNOSIS — K5190 Ulcerative colitis, unspecified, without complications: Secondary | ICD-10-CM | POA: Diagnosis not present

## 2013-10-18 LAB — GLUCOSE, CAPILLARY: Glucose-Capillary: 224 mg/dL — ABNORMAL HIGH (ref 70–99)

## 2013-10-18 MED ORDER — SODIUM CHLORIDE 0.9 % IV SOLN
400.0000 mg | INTRAVENOUS | Status: DC
  Administered 2013-10-18: 400 mg via INTRAVENOUS
  Filled 2013-10-18: qty 40

## 2013-10-18 MED ORDER — SODIUM CHLORIDE 0.9 % IV SOLN
INTRAVENOUS | Status: DC
  Administered 2013-10-18: 11:00:00 via INTRAVENOUS

## 2013-10-18 MED ORDER — ACETAMINOPHEN 325 MG PO TABS
ORAL_TABLET | ORAL | Status: AC
  Filled 2013-10-18: qty 2

## 2013-10-18 MED ORDER — ACETAMINOPHEN 325 MG PO TABS
650.0000 mg | ORAL_TABLET | Freq: Four times a day (QID) | ORAL | Status: DC | PRN
  Administered 2013-10-18: 650 mg via ORAL

## 2013-10-18 MED ORDER — LORATADINE 10 MG PO TABS
ORAL_TABLET | ORAL | Status: AC
  Filled 2013-10-18: qty 1

## 2013-10-18 MED ORDER — LORATADINE 10 MG PO TABS
10.0000 mg | ORAL_TABLET | Freq: Every day | ORAL | Status: DC
  Administered 2013-10-18: 10 mg via ORAL

## 2013-10-18 NOTE — Discharge Instructions (Signed)
Ulcerative Colitis Ulcerative colitis is a long lasting swelling and soreness (inflammation) of the colon (large intestine). In patients with ulcerative colitis, sores (ulcers) and inflammation of the inner lining of the colon lead to illness. Ulcerative colitis can also cause problems outside the digestive tract.  Ulcerative colitis is closely related to another condition of inflammation of the intestines called Crohn's disease. Together, they are frequently referred to as inflammatory bowel disease (IBD). Ulcerative colitis and Crohn's diseases are conditions that can last years to decades. Men and women are affected equally. They most commonly begin during adolescence and early adulthood. SYMPTOMS  Common symptoms of ulcerative colitis include rectal bleeding and diarrhea. There is a wide range of symptoms among patients with this disease depending on how severe the disease is. Some of these symptoms are:  Abdominal pain or cramping.  Diarrhea.  Fever.  Tiredness (fatigue).  Weight loss.  Night sweats.  Rectal pain.  Feeling the immediate need to have a bowel movement (rectal urgency). CAUSES  Ulcerative colitis is caused by increased activity of the immune system in the intestines. The immune system is the system that protects the body against disease such as harmful bacteria, viruses, fungi, and other foreign invaders. When the immune system overacts, it causes inflammation. The cause of the increased immune system activity is not known. This over activity causes long-lasting inflammation and ulceration. This condition may be passed down from your parents (inherited). Brothers, sisters, children, and parents of patients with IBD are more likely to develop these diseases. It is not contagious. This means you cannot catch it from someone else. DIAGNOSIS  Your caregiver may suspect ulcerative colitis based on your symptoms and exam. Blood tests may confirm that there is a problem. You may  be asked to submit a stool specimen for examination. X-rays and CT scans may be necessary. Ultimately, the diagnosis is usually made after a flexible tube is inserted via your anus and your colon is examined under sedation (colonoscopy). With this test, the specialist can take a tiny tissue sample from inside the bowel (biopsy). Examination of this biopsy tissue under a microscopy can reveal ulcerative colitis as the cause of your symptoms. TREATMENT   There is no cure for ulcerative colitis.  Complications such as massive bleeding from the colon (hemorrhage), development of a hole in the colon (perforation), or the development of precancerous or cancerous changes of the colon may require surgery.  Medications are often used to decrease inflammation and control the immune system. These include medicines related to aspirin, steroid medications, and newer and stronger medications to slow down the immune system. Some medications may be used as suppositories or enemas. A number of other medications are used or have been studied. Your caregiver will make specific recommendations. HOME CARE INSTRUCTIONS   There is no cure for ulcerative colitis disease. The best treatment is frequent checkups with your caregiver. Periodic reevaluation is important.  Symptoms such as diarrhea can be controlled with medications. Avoid foods that have a laxative effect such fresh fruit and vegetables and dairy products. During flare ups, you can rest your bowel by staying away from solid foods. Drink clear liquids frequently during the day. Electrolyte or rehydrating fluids are best. Your caregiver can help you with suggestions. Drink often to prevent dehydration. When diarrhea has cleared, eat smaller meals and more often. Avoid food additives and stimulants such as caffeine (coffee, tea, many sodas, or chocolate). Avoid dairy products. Enzyme supplements may help if you develop intolerance to  a sugar in dairy products  (lactose). Ask your caregiver or dietitian about specific dietary instructions.  If you had surgery, be sure you understand your care instructions thoroughly, including proper care of any surgical wounds.  Take any medications exactly as prescribed.  Try to maintain a positive attitude. Learn relaxation techniques such as self hypnosis, mental imaging, and muscle relaxation. If possible, avoid stresses that aggravate your condition. Exercise regularly. Follow your diet. Always get plenty of rest. SEEK MEDICAL CARE IF:   Your symptoms fail to improve after a week or two of new treatment.  You experience continued weight loss.  You have ongoing crampy digestion or loose bowels.  You develop a new skin rash, skin sores, or eye problems. SEEK IMMEDIATE MEDICAL CARE IF:   You have worsening of your symptoms or develop new symptoms.  You have an oral temperature above 102 F (38.9 C), not controlled by medicine.  You develop bloody diarrhea.  You have severe abdominal pain. Document Released: 06/26/2005 Document Revised: 12/09/2011 Document Reviewed: 05/26/2007 [facility] Patient Information 2014 [place], [place].   Infliximab injection What is this medicine? INFLIXIMAB (in [place]) is used to treat Crohn's disease and ulcerative colitis. It is also used to treat ankylosing spondylitis, psoriasis, and some forms of arthritis. This medicine may be used for other purposes; ask your health care provider or pharmacist if you have questions. COMMON BRAND NAME(S): Remicade What should I tell my health care provider before I take this medicine? They need to know if you have any of these conditions: -diabetes -exposure to tuberculosis -heart failure -hepatitis or liver disease -immune system problems -infection -lung or breathing disease, like COPD -multiple sclerosis -current or past resident of [place] or [place] -seizure disorder -an unusual or allergic reaction to  infliximab, mouse proteins, other medicines, foods, dyes, or preservatives -pregnant or trying to get pregnant -breast-feeding How should I use this medicine? This medicine is for injection into a vein. It is usually given by a health care professional in a hospital or clinic setting. A special MedGuide will be given to you by the pharmacist with each prescription and refill. Be sure to read this information carefully each time. Talk to your pediatrician regarding the use of this medicine in children. Special care may be needed. Overdosage: If you think you have taken too much of this medicine contact a poison control center or emergency room at once. NOTE: This medicine is only for you. Do not share this medicine with others. What if I miss a dose? It is important not to miss your dose. Call your doctor or health care professional if you are unable to keep an appointment. What may interact with this medicine? Do not take this medicine with any of the following medications: -anakinra -rilonacept This medicine may also interact with the following medications: -vaccines This list may not describe all possible interactions. Give your health care provider a list of all the medicines, herbs, non-prescription drugs, or dietary supplements you use. Also tell them if you smoke, drink alcohol, or use illegal drugs. Some items may interact with your medicine. What should I watch for while using this medicine? Visit your doctor or health care professional for regular checks on your progress. If you get a cold or other infection while receiving this medicine, call your doctor or health care professional. Do not treat yourself. This medicine may decrease your body's ability to fight infections. Before beginning therapy, your doctor may do a test to  see if you have been exposed to tuberculosis. This medicine may make the symptoms of heart failure worse in some patients. If you notice symptoms such as  increased shortness of breath or swelling of the ankles or legs, contact your health care provider right away. If you are going to have surgery or dental work, tell your health care professional or dentist that you have received this medicine. If you take this medicine for plaque psoriasis, stay out of the sun. If you cannot avoid being in the sun, wear protective clothing and use sunscreen. Do not use sun lamps or tanning beds/booths. What side effects may I notice from receiving this medicine? Side effects that you should report to your doctor or health care professional as soon as possible: -allergic reactions like skin rash, itching or hives, swelling of the face, lips, or tongue -chest pain -fever or chills, usually related to the infusion -muscle or joint pain -red, scaly patches or raised bumps on the skin -signs of infection - fever or chills, cough, sore throat, pain or difficulty passing urine -swollen lymph nodes in the neck, underarm, or groin areas -unexplained weight loss -unusual bleeding or bruising -unusually weak or tired -yellowing of the eyes or skin Side effects that usually do not require medical attention (report to your doctor or health care professional if they continue or are bothersome): -headache -heartburn or stomach pain -nausea, vomiting This list may not describe all possible side effects. Call your doctor for medical advice about side effects. You may report side effects to FDA at 1-800-FDA-1088. Where should I keep my medicine? This drug is given in a hospital or clinic and will not be stored at home. NOTE: This sheet is a summary. It may not cover all possible information. If you have questions about this medicine, talk to your doctor, pharmacist, or health care provider.  2014, Elsevier/Gold Standard. (2008-05-04 10:26:02)

## 2013-10-20 ENCOUNTER — Ambulatory Visit (INDEPENDENT_AMBULATORY_CARE_PROVIDER_SITE_OTHER): Payer: Medicare Other | Admitting: Endocrinology

## 2013-10-20 ENCOUNTER — Encounter: Payer: Self-pay | Admitting: Endocrinology

## 2013-10-20 ENCOUNTER — Encounter (HOSPITAL_COMMUNITY): Payer: Medicare Other

## 2013-10-20 VITALS — BP 122/88 | HR 76 | Temp 98.0°F | Ht 62.0 in | Wt 200.0 lb

## 2013-10-20 DIAGNOSIS — E1049 Type 1 diabetes mellitus with other diabetic neurological complication: Secondary | ICD-10-CM

## 2013-10-20 LAB — HEMOGLOBIN A1C: HEMOGLOBIN A1C: 9.3 % — AB (ref 4.6–6.5)

## 2013-10-20 MED ORDER — INSULIN ASPART 100 UNIT/ML FLEXPEN: 110.0000 [IU] | PEN_INJECTOR | Freq: Three times a day (TID) | SUBCUTANEOUS | Status: DC

## 2013-10-20 NOTE — Progress Notes (Signed)
Subjective:    Patient ID: [name], female    DOB: [birth year], 68 y.o.   MRN: [id number]  HPI Pt returns for f/u of type 2 DM (dx'ed 2007, on a routine blood test; he has mild if any neuropathy of the lower extremities; no other associated complications; she has been on insulin since 2013; she has never had severe hypoglycemia or DKA; she takes multiple daily injections).  Since last ov, she has not required any further steroid shots.  she brings a record of her cbg's which i have reviewed today.  It varies from 95-301.  There is no trend throughout the day, except it is sometimes lower at HS.  She c/o weight gain.   Past Medical History  Diagnosis Date  . GERD   . Osteoarthritis   . HYPERTENSION   . IDDM (insulin dependent diabetes mellitus)   . Ulcerative colitis     Remicade infusion Q 8 weeks  . History of thrombocytopenia 12/2011  . 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 Dental bridge present     lower  . Anxiety   . Wears glasses     reading  . PONV (postoperative nausea and vomiting)     Past Surgical History  Procedure Laterality Date  . Abdominal hysterectomy  1980    partial  . Breast reduction surgery  1994  . Rectocele repair  1990; 09/12/2006  . Carpal tunnel release Right 1996  . Knee arthroscopy Right 01/1999; 10/2000  . Hemilaminotomy lumbar spine Bilateral 09/07/1999    L4-5  . Cholecystectomy    . Flexible sigmoidoscopy  01/17/2012    Procedure: FLEXIBLE SIGMOIDOSCOPY;  Surgeon: Rogene Houston, MD;  Location[facility];  Service: Endoscopy;  Laterality: N/A;  . Bilateral salpingoophorectomy  02/10/2001  . Tarsal tunnel release  2002  . Ureterolysis Right 02/10/2001  . Lysis of adhesion  02/10/2001  . Carpal tunnel release Left 03/21/2003  . 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 Esophagogastroduodenoscopy (egd) with esophageal dilation  12/02/2005  . Carpal tunnel release Right 05/04/2013    Procedure:  CARPAL TUNNEL RELEASE;  Surgeon: Cammie Sickle., MD;  Location: [facility];  Service: Orthopedics;  Laterality: Right;  . Carpal tunnel release Left 09/21/2013    Procedure: LEFT CARPAL TUNNEL RELEASE;  Surgeon: Cammie Sickle., MD;  Location: [facility];  Service: Orthopedics;  Laterality: Left;    History   Social History  . Marital Status: Married    Spouse Name: N/A    Number of Children: N/A  . Years of Education: N/A   Occupational History  . Not on file.   Social History Main Topics  . Smoking status: Never Smoker   . Smokeless tobacco: Never Used  . Alcohol Use: No  . Drug Use: No  . Sexual Activity: Yes    Partners: Male    Birth Control/ Protection: Surgical     Comment: hysterectomy   Other Topics Concern  . Not on file   Social History Narrative  . No narrative on file    Current Outpatient Prescriptions on File Prior to Visit  Medication Sig Dispense Refill  . esomeprazole (NEXIUM) 40 MG capsule Take 1 capsule (40 mg total) by mouth 2 (two) times daily.  180 capsule  2  . Flora-Q (FLORA-Q) CAPS Take 1 capsule by mouth daily.  60 capsule  1  .  furosemide (LASIX) 40 MG tablet TAKE 1 TABLET DAILY  90 tablet  1  . glucose blood test strip Test 3 times daily as directed  100 each  10  . inFLIXimab (REMICADE) 100 MG injection Inject into the vein every 8 (eight) weeks.       . Insulin Pen Needle 31G X 5 MM MISC USE AS DIRECTED BY PHYSICIAN.  200 each  3  . KLOR-CON M10 10 MEQ tablet TAKE 2 TABLETS (20 MEQ) DAILY  180 tablet  1  . loperamide (IMODIUM) 2 MG capsule Take 4 mg by mouth 2 (two) times daily.       . meclizine (ANTIVERT) 25 MG tablet Take 1 tablet (25 mg total) by mouth 3 (three) times daily as needed for dizziness or nausea.  30 tablet  0  . Melatonin 3 MG CAPS Take by mouth at bedtime.      . metoprolol succinate (TOPROL-XL) 100 MG 24 hr tablet Take 1 tablet (100 mg total) by mouth 2 (two) times daily.  180 tablet   3  . Naproxen Sodium (ALEVE PO) Take by mouth. Takes 2 Aleve PM @@ bedtime nightly      . neomycin-polymyxin-hydrocortisone (CORTISPORIN) otic solution Apply one to two drops to toes after soaking twice daily.  10 mL  0  . ONE TOUCH LANCETS MISC PATIENT USES ONE TOUCH DELICAL 63A LANCETS. USE AS DIRECTED TWICE DAILY'; DIAGNOSIS CODE 250.02  200 each  6  . ONETOUCH DELICA LANCETS FINE MISC 2 each by Does not apply route 2 (two) times daily.  100 each  10  . ONETOUCH DELICA LANCETS MISC And lancets, twice daily dx 250.02      . traMADol (ULTRAM) 50 MG tablet 1 or 2 tabs every 6 hours as needed for pain  24 tablet  0  . venlafaxine XR (EFFEXOR-XR) 75 MG 24 hr capsule Take 1 capsule (75 mg total) by mouth daily.  90 capsule  3  . [DISCONTINUED] amitriptyline (ELAVIL) 25 MG tablet Take 1 tablet (25 mg total) by mouth at bedtime. start with 1/2 tab at night for 1 week then increase to 1 whole tab.  30 tablet  3  . [DISCONTINUED] bromocriptine (PARLODEL) 2.5 MG tablet Take 2.5 mg by mouth 2 (two) times daily.        . [DISCONTINUED] potassium chloride (K-DUR) 10 MEQ tablet Take 2 tablets (20 mEq total) by mouth daily.  90 tablet  1   No current facility-administered medications on file prior to visit.    Allergies  Allergen Reactions  . Actos [Pioglitazone] Other (See Comments)    EDEMA   . Benzocaine-Menthol Swelling    SWELLING OF MOUTH  . Colesevelam Other (See Comments)    GI UPSET  . Flagyl [Metronidazole Hcl] Other (See Comments)    DIAPHORESIS  . Metformin And Related Diarrhea  . Omeprazole Swelling    SWELLING OF TONGUE AND THROAT  . Shrimp [Shellfish Allergy] Itching    OF THROAT AND EARS  . Statins Other (See Comments)    HEART RACING  . Hydromorphone Itching  . Adhesive [Tape] Other (See Comments)    SKIN IRRITATION AND BRUISING  . Nisoldipine Itching  . Percocet [Oxycodone-Acetaminophen] Itching    Family History  Problem Relation Age of Onset  . Diabetes Mother   .  Hypertension Mother   . Heart attack Father     Mid 61's  . Heart disease Father   . Lung disease Father  spot on lung; had lung surgery  . Alcohol abuse Other   . Hypertension Son   . Diabetes Son    BP 122/88  Pulse 76  Temp(Src) 98 F (36.7 C) (Oral)  Ht 5' 2"  (1.575 m)  Wt 200 lb (90.719 kg)  BMI 36.57 kg/m2  SpO2 96%  Review of Systems denies hypoglycemia.  She has edema.      Objective:   Physical Exam VITAL SIGNS:  See vs page GENERAL: no distress  Lab Results  Component Value Date   HGBA1C 9.3* 10/20/2013      Assessment & Plan:  DM: This insulin regimen was chosen from multiple options, as it best matches her insulin to her changing requirements throughout the day.  The benefits of glycemic control must be weighed against the risks of hypoglycemia.  She needs increased rx. Weight gain: this limits the rx of DM.

## 2013-10-20 NOTE — Patient Instructions (Addendum)
check your blood sugar twice a day.  vary the time of day when you check, between before the 3 meals, and at bedtime.  also check if you have symptoms of your blood sugar being too high or too low.  please keep a record of the readings and bring it to your next appointment here.  please call us sooner if your blood sugar goes below 70, or if it stays over 200.    Please come back for a follow-up appointment in 1 month.   Please increase the insulin to 110 units 3 times a day (just before each meal).   Refer to a dietician specialist.  you will receive a phone call, about a day and time for an appointment.

## 2013-11-01 ENCOUNTER — Encounter: Payer: Self-pay | Admitting: *Deleted

## 2013-11-01 ENCOUNTER — Encounter: Payer: Medicare Other | Attending: Endocrinology | Admitting: *Deleted

## 2013-11-01 DIAGNOSIS — Z713 Dietary counseling and surveillance: Secondary | ICD-10-CM | POA: Insufficient documentation

## 2013-11-01 DIAGNOSIS — Z794 Long term (current) use of insulin: Secondary | ICD-10-CM | POA: Insufficient documentation

## 2013-11-01 DIAGNOSIS — E1165 Type 2 diabetes mellitus with hyperglycemia: Secondary | ICD-10-CM

## 2013-11-01 DIAGNOSIS — IMO0002 Reserved for concepts with insufficient information to code with codable children: Secondary | ICD-10-CM

## 2013-11-01 DIAGNOSIS — E119 Type 2 diabetes mellitus without complications: Secondary | ICD-10-CM | POA: Insufficient documentation

## 2013-11-01 NOTE — Progress Notes (Signed)
Appt start time: 1100 end time:  1230.  Assessment:  Patient was seen on  11/01/13 for individual diabetes education. She has had diabetes for about 5 years and has been on insulin for the past 2 years. Here with her husband, Barnabas Lister who appears supportive. Retired from [employer], [employer]. She enjoys sewing and works around the house. She SMBG 2-3 times a day with reported range of low 200's and if below 200 at night and she takes her Novolog with evening she will wake up with a low BG in the 70's.  Sometimes as high as 400 mg/dl. She states she and her husband used to walk about 4 miles a day but when she fell at work and injured her knee and back, she has not done that since recovering. She prefers exercising inside and has multiple types of equipment available in her home that she states she is willing to start using again.  Current HbA1c: 9.3%   Preferred Learning Style:   Hands on  Learning Readiness:   Ready  Change in progress  MEDICATIONS: see list.  DIETARY INTAKE:  24-hr recall:  B (10 AM): sausage and egg biscuit from [employer] that husband brings home each AM, coffee with cream and Splenda  Snk ( AM): fresh fruit   L (3 PM): local grill with burger steak, salad and french fries OR burger with fries OR meat and vegetables if eating at home, water Snk ( PM): no D ( PM): snack in front of tv: sandwich with PNB and jelly, occasionally with chips, fresh fruit Snk ( PM): no Beverages: coffee, water, diet Dr. Malachi Bonds  Usual physical activity: none except for house work  Estimated energy needs: 1400 calories 158 g carbohydrates 105 g protein 39 g fat    Intervention:  Nutrition counseling provided.  Discussed diabetes disease process and treatment options.  Discussed physiology of diabetes and role of obesity on insulin resistance.  Encouraged moderate weight reduction to improve glucose levels.  Discussed role of medications and diet in glucose control  Provided education on  macronutrients on glucose levels.  Provided education on carb counting, importance of regularly scheduled meals/snacks, and meal planning  Discussed effects of physical activity on glucose levels and long-term glucose control.  Recommended 150 minutes of physical activity/week.  Reviewed patient medications.  Discussed role of medication (Novolog insulin) on blood glucose and possible side effects  Instructed her to notify her MD if BG's start dropping too low, especially with increase in exercise and decrease of portion sizes of food.  Discussed blood glucose monitoring and interpretation.  Discussed recommended target ranges and individual ranges.    Described short-term complications: hyper- and hypo-glycemia.  Discussed causes,symptoms, and treatment options.  Provided information on:  Prevention, detection, and treatment of long-term complications.  Discussed the role of prolonged elevated glucose levels on body systems.  Role of stress on blood glucose levels and discussed strategies to manage psychosocial issues.  Recommendations for long-term diabetes self-care.  Established checklist for medical, dental, and emotional self-care.  Plan:  Aim for 3 Carb Choices per meal (45 grams) +/- 1 either way  Aim for 0-2 Carbs per snack if hungry  Consider reading food labels for Total Carbohydrate of foods Consider  increasing your activity level by using your home exercise equipment for 15 - 30 minutes daily in smaller spurts as tolerated Continue checking BG at alternate times per day as directed by MD  Let MD know if BG starts dropping too low so he  can adjust your insulin dose accordingly.  Teaching Method Utilized: Visual, Auditory and Hands on  Handouts given during visit include: Living Well with Diabetes Carb Counting and Food Label handouts Meal Plan Card  Barriers to learning/adherence to lifestyle change: none at this time  Diabetes self-care support plan:   [facility]  support group  Demonstrated degree of understanding via:  Teach Back   Monitoring/Evaluation:  Dietary intake, exercise, reading food labels, and body weight prn.

## 2013-11-01 NOTE — Patient Instructions (Signed)
Plan:  Aim for 3 Carb Choices per meal (45 grams) +/- 1 either way  Aim for 0-2 Carbs per snack if hungry  Consider reading food labels for Total Carbohydrate of foods Consider  increasing your activity level by using your home exercise equipment for 15 - 30 minutes daily in smaller spurts as tolerated Continue checking BG at alternate times per day as directed by MD  Let MD know if BG starts dropping too low so he can adjust your insulin dose accordingly.

## 2013-11-03 ENCOUNTER — Ambulatory Visit (HOSPITAL_COMMUNITY)
Admission: RE | Admit: 2013-11-03 | Discharge: 2013-11-03 | Disposition: A | Discharge status: Home / Self Care | Payer: Medicare Other | Source: Ambulatory Visit | Attending: Obstetrics & Gynecology | Admitting: Obstetrics & Gynecology

## 2013-11-03 DIAGNOSIS — R928 Other abnormal and inconclusive findings on diagnostic imaging of breast: Secondary | ICD-10-CM | POA: Diagnosis not present

## 2013-11-03 DIAGNOSIS — Z09 Encounter for follow-up examination after completed treatment for conditions other than malignant neoplasm: Secondary | ICD-10-CM | POA: Insufficient documentation

## 2013-11-23 ENCOUNTER — Ambulatory Visit: Payer: Medicare Other | Admitting: Endocrinology

## 2013-11-25 ENCOUNTER — Other Ambulatory Visit: Payer: Self-pay

## 2013-11-29 ENCOUNTER — Ambulatory Visit: Payer: Medicare Other | Admitting: Internal Medicine

## 2013-11-29 ENCOUNTER — Other Ambulatory Visit: Payer: Self-pay

## 2013-11-29 ENCOUNTER — Encounter: Payer: Self-pay | Admitting: Endocrinology

## 2013-11-29 ENCOUNTER — Ambulatory Visit (INDEPENDENT_AMBULATORY_CARE_PROVIDER_SITE_OTHER): Payer: Medicare Other | Admitting: Endocrinology

## 2013-11-29 VITALS — BP 110/70 | HR 75 | Temp 98.1°F | Ht 62.0 in | Wt 203.0 lb

## 2013-11-29 DIAGNOSIS — E1049 Type 1 diabetes mellitus with other diabetic neurological complication: Secondary | ICD-10-CM | POA: Diagnosis not present

## 2013-11-29 DIAGNOSIS — G5600 Carpal tunnel syndrome, unspecified upper limb: Secondary | ICD-10-CM | POA: Diagnosis not present

## 2013-11-29 MED ORDER — GLUCOSE BLOOD VI STRP: ORAL_STRIP | Status: DC

## 2013-11-29 MED ORDER — ONETOUCH LANCETS MISC: Status: DC

## 2013-11-29 NOTE — Progress Notes (Signed)
Subjective:    Patient ID: [name], female    DOB: [birth year], 68 y.o.   MRN: [id number]  HPI Pt returns for f/u of type 2 DM (dx'ed 2007, on a routine blood test; he has mild if any neuropathy of the lower extremities; no other associated complications; she has been on insulin since 2013; she has never had severe hypoglycemia or DKA; she takes multiple daily injections).  Since last ov, she has not required any further steroid shots.  she brings a record of her cbg's which i have reviewed today.  She c/o excessive appetite and severe insomnia.   Past Medical History  Diagnosis Date  . GERD   . Osteoarthritis   . HYPERTENSION   . IDDM (insulin dependent diabetes mellitus)   . Ulcerative colitis     Remicade infusion Q 8 weeks  . History of thrombocytopenia 12/2011  . 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 Dental bridge present     lower  . Anxiety   . Wears glasses     reading  . PONV (postoperative nausea and vomiting)     Past Surgical History  Procedure Laterality Date  . Abdominal hysterectomy  1980    partial  . Breast reduction surgery  1994  . Rectocele repair  1990; 09/12/2006  . Carpal tunnel release Right 1996  . Knee arthroscopy Right 01/1999; 10/2000  . Hemilaminotomy lumbar spine Bilateral 09/07/1999    L4-5  . Cholecystectomy    . Flexible sigmoidoscopy  01/17/2012    Procedure: FLEXIBLE SIGMOIDOSCOPY;  Surgeon: Rogene Houston, MD;  [facility];  Service: Endoscopy;  Laterality: N/A;  . Bilateral salpingoophorectomy  02/10/2001  . Tarsal tunnel release  2002  . Ureterolysis Right 02/10/2001  . Lysis of adhesion  02/10/2001  . Carpal tunnel release Left 03/21/2003  . 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 Esophagogastroduodenoscopy (egd) with esophageal dilation  12/02/2005  . Carpal tunnel release Right 05/04/2013    Procedure: CARPAL TUNNEL RELEASE;  Surgeon: Cammie Sickle., MD;  Location: [place];  Service: Orthopedics;  Laterality: Right;  . Carpal tunnel release Left 09/21/2013    Procedure: LEFT CARPAL TUNNEL RELEASE;  Surgeon: Cammie Sickle., MD;  Location: [place];  Service: Orthopedics;  Laterality: Left;    History   Social History  . Marital Status: Married    Spouse Name: N/A    Number of Children: N/A  . Years of Education: N/A   Occupational History  . Not on file.   Social History Main Topics  . Smoking status: Never Smoker   . Smokeless tobacco: Never Used  . Alcohol Use: No  . Drug Use: No  . Sexual Activity: Yes    Partners: Male    Birth Control/ Protection: Surgical     Comment: hysterectomy   Other Topics Concern  . Not on file   Social History Narrative  . No narrative on file    Current Outpatient Prescriptions on File Prior to Visit  Medication Sig Dispense Refill  . esomeprazole (NEXIUM) 40 MG capsule Take 1 capsule (40 mg total) by mouth 2 (two) times daily.  180 capsule  2  . Flora-Q (FLORA-Q) CAPS Take 1 capsule by mouth daily.  60 capsule  1  . furosemide (LASIX) 40 MG tablet TAKE 1 TABLET DAILY  90 tablet  1  .  inFLIXimab (REMICADE) 100 MG injection Inject into the vein every 8 (eight) weeks.       . Insulin Pen Needle 31G X 5 MM MISC USE AS DIRECTED BY PHYSICIAN.  200 each  3  . KLOR-CON M10 10 MEQ tablet TAKE 2 TABLETS (20 MEQ) DAILY  180 tablet  1  . loperamide (IMODIUM) 2 MG capsule Take 4 mg by mouth 2 (two) times daily.       . meclizine (ANTIVERT) 25 MG tablet Take 1 tablet (25 mg total) by mouth 3 (three) times daily as needed for dizziness or nausea.  30 tablet  0  . Melatonin 3 MG CAPS Take by mouth at bedtime.      . metoprolol succinate (TOPROL-XL) 100 MG 24 hr tablet Take 1 tablet (100 mg total) by mouth 2 (two) times daily.  180 tablet  3  . Naproxen Sodium (ALEVE PO) Take by mouth. Takes 2 Aleve PM @@ bedtime nightly      . neomycin-polymyxin-hydrocortisone (CORTISPORIN) otic  solution Apply one to two drops to toes after soaking twice daily.  10 mL  0  . ONETOUCH DELICA LANCETS FINE MISC 2 each by Does not apply route 2 (two) times daily.  100 each  10  . ONETOUCH DELICA LANCETS MISC And lancets, twice daily dx 250.02      . traMADol (ULTRAM) 50 MG tablet 1 or 2 tabs every 6 hours as needed for pain  24 tablet  0  . venlafaxine XR (EFFEXOR-XR) 75 MG 24 hr capsule Take 1 capsule (75 mg total) by mouth daily.  90 capsule  3  . [DISCONTINUED] amitriptyline (ELAVIL) 25 MG tablet Take 1 tablet (25 mg total) by mouth at bedtime. start with 1/2 tab at night for 1 week then increase to 1 whole tab.  30 tablet  3  . [DISCONTINUED] bromocriptine (PARLODEL) 2.5 MG tablet Take 2.5 mg by mouth 2 (two) times daily.        . [DISCONTINUED] potassium chloride (K-DUR) 10 MEQ tablet Take 2 tablets (20 mEq total) by mouth daily.  90 tablet  1   No current facility-administered medications on file prior to visit.    Allergies  Allergen Reactions  . Actos [Pioglitazone] Other (See Comments)    EDEMA   . Benzocaine-Menthol Swelling    SWELLING OF MOUTH  . Colesevelam Other (See Comments)    GI UPSET  . Flagyl [Metronidazole Hcl] Other (See Comments)    DIAPHORESIS  . Metformin And Related Diarrhea  . Omeprazole Swelling    SWELLING OF TONGUE AND THROAT  . Shrimp [Shellfish Allergy] Itching    OF THROAT AND EARS  . Statins Other (See Comments)    HEART RACING  . Hydromorphone Itching  . Adhesive [Tape] Other (See Comments)    SKIN IRRITATION AND BRUISING  . Nisoldipine Itching  . Percocet [Oxycodone-Acetaminophen] Itching    Family History  Problem Relation Age of Onset  . Diabetes Mother   . Hypertension Mother   . Heart attack Father     Mid 21's  . Heart disease Father   . Lung disease Father     spot on lung; had lung surgery  . Alcohol abuse Other   . Hypertension Son   . Diabetes Son     BP 110/70  Pulse 75  Temp(Src) 98.1 F (36.7 C) (Oral)  Ht 5'  2" (1.575 m)  Wt 203 lb (92.08 kg)  BMI 37.12 kg/m2  SpO2 96%  Review  of Systems denies hypoglycemia.  She reports weight gain.      Objective:   Physical Exam VITAL SIGNS:  See vs page GENERAL: no distress PSYCH: Alert and well-oriented.  Does not appear anxious nor depressed.   Lab Results  Component Value Date   HGBA1C 9.3* 10/20/2013      Assessment & Plan:  DM: This insulin regimen was chosen from multiple options, as it best matches her insulin to her changing requirements throughout the day.  The benefits of glycemic control must be weighed against the risks of hypoglycemia.  She needs increased rx. Weight gain: this complicates the rx of DM.  This is most likely why her insulin requirement has increased.

## 2013-11-29 NOTE — Patient Instructions (Addendum)
check your blood sugar 3 times a day.  vary the time of day when you check, between before the 3 meals, and at bedtime.  also check if you have symptoms of your blood sugar being too high or too low.  please keep a record of the readings and bring it to your next appointment here.  please call us sooner if your blood sugar goes below 70, or if it stays over 200.    Please come back for a follow-up appointment in 1 month.   Please increase the insulin to 3 times a day (just before each meal): 110-130-130 units.

## 2013-11-30 ENCOUNTER — Encounter (HOSPITAL_COMMUNITY)
Admission: RE | Admit: 2013-11-30 | Discharge: 2013-11-30 | Disposition: A | Discharge status: Home / Self Care | Payer: Medicare Other | Source: Ambulatory Visit | Attending: Internal Medicine | Admitting: Internal Medicine

## 2013-11-30 ENCOUNTER — Encounter (HOSPITAL_BASED_OUTPATIENT_CLINIC_OR_DEPARTMENT_OTHER): Payer: Medicare Other

## 2013-11-30 DIAGNOSIS — D696 Thrombocytopenia, unspecified: Secondary | ICD-10-CM

## 2013-11-30 DIAGNOSIS — K5190 Ulcerative colitis, unspecified, without complications: Secondary | ICD-10-CM | POA: Insufficient documentation

## 2013-11-30 DIAGNOSIS — D72820 Lymphocytosis (symptomatic): Secondary | ICD-10-CM

## 2013-11-30 LAB — CBC WITH DIFFERENTIAL/PLATELET
BASOS ABS: 0 10*3/uL (ref 0.0–0.1)
BASOS PCT: 0 % (ref 0–1)
Eosinophils Absolute: 0.1 10*3/uL (ref 0.0–0.7)
Eosinophils Relative: 1 % (ref 0–5)
HCT: 40.7 % (ref 36.0–46.0)
Hemoglobin: 14.4 g/dL (ref 12.0–15.0)
Lymphocytes Relative: 66 % — ABNORMAL HIGH (ref 12–46)
Lymphs Abs: 5.7 10*3/uL — ABNORMAL HIGH (ref 0.7–4.0)
MCH: 33.3 pg (ref 26.0–34.0)
MCHC: 35.4 g/dL (ref 30.0–36.0)
MCV: 94 fL (ref 78.0–100.0)
MONO ABS: 0.7 10*3/uL (ref 0.1–1.0)
Monocytes Relative: 8 % (ref 3–12)
NEUTROS ABS: 2.1 10*3/uL (ref 1.7–7.7)
NEUTROS PCT: 25 % — AB (ref 43–77)
Platelets: 194 10*3/uL (ref 150–400)
RBC: 4.33 MIL/uL (ref 3.87–5.11)
RDW: 12.8 % (ref 11.5–15.5)
WBC: 8.6 10*3/uL (ref 4.0–10.5)

## 2013-11-30 LAB — COMPREHENSIVE METABOLIC PANEL
ALBUMIN: 3.4 g/dL — AB (ref 3.5–5.2)
ALT: 29 U/L (ref 0–35)
AST: 32 U/L (ref 0–37)
Alkaline Phosphatase: 98 U/L (ref 39–117)
BUN: 18 mg/dL (ref 6–23)
CHLORIDE: 100 meq/L (ref 96–112)
CO2: 26 mEq/L (ref 19–32)
CREATININE: 0.82 mg/dL (ref 0.50–1.10)
Calcium: 9.4 mg/dL (ref 8.4–10.5)
GFR calc Af Amer: 85 mL/min — ABNORMAL LOW (ref >90)
GFR calc non Af Amer: 73 mL/min — ABNORMAL LOW (ref >90)
Glucose, Bld: 162 mg/dL — ABNORMAL HIGH (ref 70–99)
POTASSIUM: 3.9 meq/L (ref 3.7–5.3)
SODIUM: 138 meq/L (ref 137–147)
Total Bilirubin: 0.3 mg/dL (ref 0.3–1.2)
Total Protein: 7.2 g/dL (ref 6.0–8.3)

## 2013-11-30 MED ORDER — LORATADINE 10 MG PO TABS
ORAL_TABLET | ORAL | Status: AC
  Filled 2013-11-30: qty 1

## 2013-11-30 MED ORDER — ACETAMINOPHEN 325 MG PO TABS
650.0000 mg | ORAL_TABLET | Freq: Once | ORAL | Status: AC
  Administered 2013-11-30: 650 mg via ORAL

## 2013-11-30 MED ORDER — ACETAMINOPHEN 325 MG PO TABS
ORAL_TABLET | ORAL | Status: AC
  Filled 2013-11-30: qty 2

## 2013-11-30 MED ORDER — LORATADINE 10 MG PO TABS
10.0000 mg | ORAL_TABLET | Freq: Every day | ORAL | Status: DC
  Administered 2013-11-30: 10 mg via ORAL

## 2013-11-30 MED ORDER — SODIUM CHLORIDE 0.9 % IV SOLN
Freq: Once | INTRAVENOUS | Status: AC
  Administered 2013-11-30: 200 mL via INTRAVENOUS

## 2013-11-30 MED ORDER — SODIUM CHLORIDE 0.9 % IV SOLN
500.0000 mg | INTRAVENOUS | Status: DC
  Administered 2013-11-30: 500 mg via INTRAVENOUS
  Filled 2013-11-30: qty 50

## 2013-11-30 NOTE — Progress Notes (Signed)
[name] presented for labwork. Labs per MD order drawn via Peripheral Line 23 gauge needle inserted in right AC  Good blood return present. Procedure without incident.  Needle removed intact. Patient tolerated procedure well.

## 2013-12-03 ENCOUNTER — Other Ambulatory Visit: Payer: Self-pay

## 2013-12-03 ENCOUNTER — Other Ambulatory Visit (HOSPITAL_COMMUNITY): Payer: Medicare Other

## 2013-12-03 ENCOUNTER — Other Ambulatory Visit: Payer: Self-pay | Admitting: Endocrinology

## 2013-12-03 MED ORDER — GLUCOSE BLOOD VI STRP: ORAL_STRIP | Status: DC

## 2013-12-03 MED ORDER — INSULIN ASPART 100 UNIT/ML FLEXPEN: PEN_INJECTOR | SUBCUTANEOUS | Status: DC

## 2013-12-06 ENCOUNTER — Ambulatory Visit: Payer: Medicare Other | Admitting: Internal Medicine

## 2013-12-07 ENCOUNTER — Encounter (HOSPITAL_BASED_OUTPATIENT_CLINIC_OR_DEPARTMENT_OTHER): Payer: Medicare Other

## 2013-12-07 ENCOUNTER — Encounter (HOSPITAL_COMMUNITY): Payer: Self-pay

## 2013-12-07 VITALS — BP 143/84 | HR 76 | Temp 98.3°F | Resp 18 | Wt 207.4 lb

## 2013-12-07 DIAGNOSIS — D72820 Lymphocytosis (symptomatic): Secondary | ICD-10-CM

## 2013-12-07 DIAGNOSIS — K5190 Ulcerative colitis, unspecified, without complications: Secondary | ICD-10-CM

## 2013-12-07 DIAGNOSIS — D696 Thrombocytopenia, unspecified: Secondary | ICD-10-CM

## 2013-12-07 NOTE — Patient Instructions (Signed)
[facility] Discharge Instructions  RECOMMENDATIONS MADE BY THE CONSULTANT AND ANY TEST RESULTS WILL BE SENT TO YOUR REFERRING PHYSICIAN.  We will see you in 6 months for follow up. We will do labs at that time.  Please call if you decide to have the ct-scan.    Thank you for choosing [facility] to provide your oncology and hematology care.  To afford each patient quality time with our providers, please arrive at least 15 minutes before your scheduled appointment time.  With your help, our goal is to use those 15 minutes to complete the necessary work-up to ensure our physicians have the information they need to help with your evaluation and healthcare recommendations.    Effective January 1st, 2014, we ask that you re-schedule your appointment with our physicians should you arrive 10 or more minutes late for your appointment.  We strive to give you quality time with our providers, and arriving late affects you and other patients whose appointments are after yours.    Again, thank you for choosing [facility].  Our hope is that these requests will decrease the amount of time that you wait before being seen by our physicians.       _____________________________________________________________  Should you have questions after your visit to [facility], please contact our office at [phone] between the hours of 8:30 a.m. and 5:00 p.m.  Voicemails left after 4:30 p.m. will not be returned until the following business day.  For prescription refill requests, have your pharmacy contact our office with your prescription refill request.

## 2013-12-07 NOTE — Progress Notes (Signed)
[place]  OFFICE PROGRESS NOTE  Jennifer Grant, MD [place]. [facility] [address] [place] [address]  DIAGNOSIS: Lymphocytosis - Plan: Flow Cytometry, CBC with Differential, Comprehensive metabolic panel, Lactate dehydrogenase, Miscellaneous test  Ulcerative colitis, unspecified - Plan: CBC with Differential, Comprehensive metabolic panel, Lactate dehydrogenase, Miscellaneous test  Thrombocytopenia - Plan: Flow Cytometry, CBC with Differential, Comprehensive metabolic panel, Lactate dehydrogenase, Miscellaneous test  Chief Complaint  Patient presents with  . Lymphocytosis  . Ulcerative Colitis    CURRENT THERAPY: Remicade for ulcerative colitis.  INTERVAL HISTORY: [name] 68 y.o. 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 She also has documented lymphocytosis with mild thrombocytopenia with negative flow cytometry. She has been on Remicade for 2 years and is currently receiving every 2 months. Appetite is good with no nausea or vomiting. She does have 3 or 4 diarrheal stools per day without melena, hematochezia, epistaxis, hemoptysis, fever, or night sweats. She denies any easy satiety but has had lower extremity swelling which persists in the left lower extremity. She has chronic cough without expectoration and no shortness of breath on exertion, PND, orthopnea, or palpitations.  MEDICAL HISTORY: Past Medical History  Diagnosis Date  . GERD   . Osteoarthritis   . HYPERTENSION   . IDDM (insulin dependent diabetes mellitus)   . Ulcerative colitis     Remicade infusion Q 8 weeks  . History of thrombocytopenia 12/2011  . 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 Dental bridge present     lower  . Anxiety   . Wears glasses     reading  . PONV (postoperative nausea and vomiting)     INTERIM HISTORY: has DM  (diabetes mellitus), type 2, uncontrolled; HYPERTENSION; GERD; ULCERATIVE COLITIS; MENOPAUSAL DISORDER; SHOULDER PAIN, RIGHT; PALPITATIONS, RECURRENT; CHEST PAIN; Other postprocedural status; FATIGUE; Dyslipidemia; Numbness in feet; Anxiety; Peripheral neuropathy; Insomnia; Thrombocytopenia; Elevated LFTs; Myalgia; Polyarthralgia; Lymphocytosis; and Type I (juvenile type) diabetes mellitus with neurological manifestations, not stated as uncontrolled(250.61) on her problem list.    ALLERGIES:  is allergic to actos; benzocaine-menthol; colesevelam; flagyl; metformin and related; omeprazole; shrimp; statins; hydromorphone; adhesive; nisoldipine; and percocet.  MEDICATIONS: has a current medication list which includes the following prescription(s): esomeprazole, flora-q, furosemide, glucose blood, infliximab, insulin aspart, insulin pen needle, klor-con m10, loperamide, meclizine, metoprolol succinate, one touch lancets, onetouch delica lancets fine, tramadol, venlafaxine xr, melatonin, naproxen sodium, and neomycin-polymyxin-hydrocortisone.  SURGICAL HISTORY:  Past Surgical History  Procedure Laterality Date  . Abdominal hysterectomy  1980    partial  . Breast reduction surgery  1994  . Rectocele repair  1990; 09/12/2006  . Carpal tunnel release Right 1996  . Knee arthroscopy Right 01/1999; 10/2000  . Hemilaminotomy lumbar spine Bilateral 09/07/1999    L4-5  . Cholecystectomy    . Flexible sigmoidoscopy  01/17/2012    Procedure: FLEXIBLE SIGMOIDOSCOPY;  Surgeon: Rogene Houston, MD;  [facility];  Service: Endoscopy;  Laterality: N/A;  . Bilateral salpingoophorectomy  02/10/2001  . Tarsal tunnel release  2002  . Ureterolysis Right 02/10/2001  . Lysis of adhesion  02/10/2001  . Carpal tunnel release Left 03/21/2003  . 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 Esophagogastroduodenoscopy (egd) with esophageal dilation  12/02/2005  . Carpal tunnel release Right 05/04/2013  Procedure: CARPAL TUNNEL RELEASE;  Surgeon: Cammie Sickle., MD;  Location: [place];  Service: Orthopedics;  Laterality: Right;  . Carpal tunnel release Left 09/21/2013    Procedure: LEFT CARPAL TUNNEL RELEASE;  Surgeon: Cammie Sickle., MD;  Location: [place];  Service: Orthopedics;  Laterality: Left;    FAMILY HISTORY: family history includes Alcohol abuse in her other; Diabetes in her mother and son; Heart attack in her father; Heart disease in her father; Hypertension in her mother and son; Lung disease in her father.  SOCIAL HISTORY:  reports that she has never smoked. She has never used smokeless tobacco. She reports that she does not drink alcohol or use illicit drugs.  REVIEW OF SYSTEMS:  Other than that discussed above is noncontributory.  PHYSICAL EXAMINATION: ECOG PERFORMANCE STATUS: 1 - Symptomatic but completely ambulatory  Blood pressure 143/84, pulse 76, temperature 98.3 F (36.8 C), temperature source Oral, resp. rate 18, weight 207 lb 6.4 oz (94.076 kg), SpO2 99.00%.  GENERAL:alert, no distress and comfortable. Moderately obese. SKIN: skin color, texture, turgor are normal, no rashes or significant lesions EYES: PERLA; Conjunctiva are pink and non-injected, sclera clear OROPHARYNX:no exudate, no erythema on lips, buccal mucosa, or tongue. NECK: supple, thyroid normal size, non-tender, without nodularity. No masses CHEST: Normal AP diameter with no breast masses. LYMPH:  no palpable lymphadenopathy in the cervical, axillary or inguinal LUNGS: clear to auscultation and percussion with normal breathing effort HEART: regular rate & rhythm and no murmurs. ABDOMEN:abdomen soft, non-tender and normal bowel sounds. Obese and soft with no organomegaly, ascites, or CVA tenderness.. MUSCULOSKELETAL:no cyanosis of digits and no clubbing. Range of motion normal.. Left lower extremity +1 edema.  NEURO: alert & oriented x 3 with fluent  speech, no focal motor/sensory deficits   LABORATORY DATA: Infusion on 11/30/2013  Component Date Value Ref Range Status  . WBC 11/30/2013 8.6  4.0 - 10.5 K/uL Final  . RBC 11/30/2013 4.33  3.87 - 5.11 MIL/uL Final  . Hemoglobin 11/30/2013 14.4  12.0 - 15.0 g/dL Final  . HCT 11/30/2013 40.7  36.0 - 46.0 % Final  . MCV 11/30/2013 94.0  78.0 - 100.0 fL Final  . MCH 11/30/2013 33.3  26.0 - 34.0 pg Final  . MCHC 11/30/2013 35.4  30.0 - 36.0 g/dL Final  . RDW 11/30/2013 12.8  11.5 - 15.5 % Final  . Platelets 11/30/2013 194  150 - 400 K/uL Final  . Neutrophils Relative % 11/30/2013 25* 43 - 77 % Final  . Neutro Abs 11/30/2013 2.1  1.7 - 7.7 K/uL Final  . Lymphocytes Relative 11/30/2013 66* 12 - 46 % Final  . Lymphs Abs 11/30/2013 5.7* 0.7 - 4.0 K/uL Final  . Monocytes Relative 11/30/2013 8  3 - 12 % Final  . Monocytes Absolute 11/30/2013 0.7  0.1 - 1.0 K/uL Final  . Eosinophils Relative 11/30/2013 1  0 - 5 % Final  . Eosinophils Absolute 11/30/2013 0.1  0.0 - 0.7 K/uL Final  . Basophils Relative 11/30/2013 0  0 - 1 % Final  . Basophils Absolute 11/30/2013 0.0  0.0 - 0.1 K/uL Final  . Sodium 11/30/2013 138  137 - 147 mEq/L Final  . Potassium 11/30/2013 3.9  3.7 - 5.3 mEq/L Final  . Chloride 11/30/2013 100  96 - 112 mEq/L Final  . CO2 11/30/2013 26  19 - 32 mEq/L Final  . Glucose, Bld 11/30/2013 162* 70 - 99 mg/dL Final  . BUN 11/30/2013 18  6 - 23 mg/dL Final  . Creatinine, Ser 11/30/2013 0.82  0.50 - 1.10 mg/dL Final  . Calcium 11/30/2013 9.4  8.4 - 10.5 mg/dL Final  . Total Protein 11/30/2013 7.2  6.0 - 8.3 g/dL Final  . Albumin 11/30/2013 3.4* 3.5 - 5.2 g/dL Final  . AST 11/30/2013 32  0 - 37 U/L Final  . ALT 11/30/2013 29  0 - 35 U/L Final  . Alkaline Phosphatase 11/30/2013 98  39 - 117 U/L Final  . Total Bilirubin 11/30/2013 0.3  0.3 - 1.2 mg/dL Final  . GFR calc non Af Amer 11/30/2013 73* >90 mL/min Final  . GFR calc Af Amer 11/30/2013 85* >90 mL/min Final   Comment: (NOTE)                           The eGFR has been calculated using the CKD EPI equation.                          This calculation has not been validated in all clinical situations.                          eGFR's persistently <90 mL/min signify possible Chronic Kidney                          Disease.    PATHOLOGY: Peripheral smear failed to reveal evidence of premature forms or smudge cells.  Urinalysis    Component Value Date/Time   COLORURINE YELLOW 01/11/2012 [place] 01/11/2012 2225   LABSPEC <1.005* 01/11/2012 2225   PHURINE 5.5 01/11/2012 2225   GLUCOSEU NEGATIVE 01/11/2012 2225   HGBUR NEGATIVE 01/11/2012 2225   BILIRUBINUR NEGATIVE 01/11/2012 2225   KETONESUR 15* 01/11/2012 2225   PROTEINUR NEGATIVE 01/11/2012 2225   UROBILINOGEN 0.2 01/11/2012 2225   NITRITE NEGATIVE 01/11/2012 2225   LEUKOCYTESUR NEGATIVE 01/11/2012 2225    RADIOGRAPHIC STUDIES: No results found.  ASSESSMENT:  #1. 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 Troublesome persistent left lower extremity swelling which may represent lymphadenopathy in the pelvis. #2. Ulcerative colitis, under excellent control with Remicade.   PLAN:  #1. It was suggested that the patient undergo CT scan of the abdomen and pelvis to assess for lymphadenopathy. She declined to do so at this time and will consult her gastroenterologist instead. #2. Followup in 6 months with CBC, chem profile, LDH, and peripheral blood flow cytometry. The patient was encouraged to call back should she decide to have the scan done so that he could be ordered.   All questions were answered. The patient knows to call the clinic with any problems, questions or concerns. We can certainly see the patient much sooner if necessary.   I spent 25 minutes counseling the patient face to face. The total time spent in the  appointment was 30 minutes.    Doroteo Bradford, MD 12/07/2013 1:54 PM

## 2013-12-09 ENCOUNTER — Ambulatory Visit (INDEPENDENT_AMBULATORY_CARE_PROVIDER_SITE_OTHER): Payer: Medicare Other | Admitting: Internal Medicine

## 2013-12-09 ENCOUNTER — Telehealth: Payer: Self-pay | Admitting: *Deleted

## 2013-12-09 ENCOUNTER — Encounter: Payer: Self-pay | Admitting: Internal Medicine

## 2013-12-09 VITALS — BP 130/82 | HR 67 | Temp 97.7°F | Wt 204.8 lb

## 2013-12-09 DIAGNOSIS — E1149 Type 2 diabetes mellitus with other diabetic neurological complication: Secondary | ICD-10-CM | POA: Diagnosis not present

## 2013-12-09 DIAGNOSIS — J309 Allergic rhinitis, unspecified: Secondary | ICD-10-CM | POA: Diagnosis not present

## 2013-12-09 DIAGNOSIS — IMO0001 Reserved for inherently not codable concepts without codable children: Secondary | ICD-10-CM | POA: Diagnosis not present

## 2013-12-09 DIAGNOSIS — E1165 Type 2 diabetes mellitus with hyperglycemia: Secondary | ICD-10-CM

## 2013-12-09 DIAGNOSIS — IMO0002 Reserved for concepts with insufficient information to code with codable children: Secondary | ICD-10-CM

## 2013-12-09 DIAGNOSIS — G609 Hereditary and idiopathic neuropathy, unspecified: Secondary | ICD-10-CM

## 2013-12-09 DIAGNOSIS — G629 Polyneuropathy, unspecified: Secondary | ICD-10-CM

## 2013-12-09 MED ORDER — "NEEDLE (DISP) 27G X 5/8"" MISC": Status: DC

## 2013-12-09 MED ORDER — METOPROLOL SUCCINATE ER 100 MG PO TB24: 100.0000 mg | ORAL_TABLET | Freq: Two times a day (BID) | ORAL | Status: DC

## 2013-12-09 MED ORDER — ESOMEPRAZOLE MAGNESIUM 40 MG PO CPDR: 40.0000 mg | DELAYED_RELEASE_CAPSULE | Freq: Two times a day (BID) | ORAL | Status: DC

## 2013-12-09 MED ORDER — INSULIN NPH (HUMAN) (ISOPHANE) 100 UNIT/ML ~~LOC~~ SUSP: 30.0000 [IU] | Freq: Every day | SUBCUTANEOUS | Status: DC

## 2013-12-09 MED ORDER — VENLAFAXINE HCL ER 75 MG PO CP24: 75.0000 mg | ORAL_CAPSULE | Freq: Every day | ORAL | Status: DC

## 2013-12-09 MED ORDER — AZELASTINE HCL 0.1 % NA SOLN: 2.0000 | Freq: Two times a day (BID) | NASAL | Status: DC

## 2013-12-09 MED ORDER — INSULIN ASPART 100 UNIT/ML FLEXPEN: PEN_INJECTOR | SUBCUTANEOUS | Status: DC

## 2013-12-09 MED ORDER — FUROSEMIDE 40 MG PO TABS: ORAL_TABLET | ORAL | Status: DC

## 2013-12-09 MED ORDER — MECLIZINE HCL 25 MG PO TABS: 25.0000 mg | ORAL_TABLET | Freq: Three times a day (TID) | ORAL | Status: DC | PRN

## 2013-12-09 MED ORDER — GLUCOSE BLOOD VI STRP: ORAL_STRIP | Status: DC

## 2013-12-09 MED ORDER — INSULIN PEN NEEDLE 31G X 5 MM MISC: Status: DC

## 2013-12-09 MED ORDER — POTASSIUM CHLORIDE CRYS ER 10 MEQ PO TBCR: EXTENDED_RELEASE_TABLET | ORAL | Status: DC

## 2013-12-09 NOTE — Assessment & Plan Note (Signed)
Add astelin and continue saline irrigation - erx done

## 2013-12-09 NOTE — Telephone Encounter (Signed)
Patient phoned stating syringes needed for insulin.  Per EMR review, Lorre Nick has already submitted.

## 2013-12-09 NOTE — Patient Instructions (Addendum)
It was good to see you today.  We have reviewed your prior records including labs and tests today  Medications reviewed and updated  Add 30 units of NPH insulin at bedtime and begin Astelin nasal spray for allergy and sinus symptoms  no other changes recommended at this time.  Your prescription(s) have been submitted to your pharmacy. Please take as directed and contact our office if you believe you are having problem(s) with the medication(s).  Refill on medication(s) as discussed today.  Please schedule followup in 3 months for diabetes mellitus check, call sooner if problems.  Diabetes and Standards of Medical Care  Diabetes is complicated. You may find that your diabetes team includes a dietitian, nurse, diabetes educator, eye doctor, and more. To help everyone know what is going on and to help you get the care you deserve, the following schedule of care was developed to help keep you on track. Below are the tests, exams, vaccines, medicines, education, and plans you will need. HbA1c test This test shows how well you have controlled your glucose over the past 2 3 months. It is used to see if your diabetes management plan needs to be adjusted.   It is performed at least 2 times a year if you are meeting treatment goals.  It is performed 4 times a year if therapy has changed or if you are not meeting treatment goals. Blood pressure test  This test is performed at every routine medical visit. The goal is less than 140/90 mmHg for most people, but 130/80 mmHg in some cases. Ask your health care provider about your goal. Dental exam  Follow up with the dentist regularly. Eye exam  If you are diagnosed with type 1 diabetes as a child, get an exam upon reaching the age of 56 years or older and have had diabetes for 3 5 years. Yearly eye exams are recommended after that initial eye exam.  If you are diagnosed with type 1 diabetes as an adult, get an exam within 5 years of diagnosis and  then yearly.  If you are diagnosed with type 2 diabetes, get an exam as soon as possible after the diagnosis and then yearly. Foot care exam  Visual foot exams are performed at every routine medical visit. The exams check for cuts, injuries, or other problems with the feet.  A comprehensive foot exam should be done yearly. This includes visual inspection as well as assessing foot pulses and testing for loss of sensation.  Check your feet nightly for cuts, injuries, or other problems with your feet. Tell your health care provider if anything is not healing. Kidney function test (urine microalbumin)  This test is performed once a year.  Type 1 diabetes: The first test is performed 5 years after diagnosis.  Type 2 diabetes: The first test is performed at the time of diagnosis.  A serum creatinine and estimated glomerular filtration rate (eGFR) test is done once a year to assess the level of chronic kidney disease (CKD), if present. Lipid profile (cholesterol, HDL, LDL, triglycerides)  Performed every 5 years for most people.  The goal for LDL is less than 100 mg/dL. If you are at high risk, the goal is less than 70 mg/dL.  The goal for HDL is 40 mg/dL 50 mg/dL for men and 50 mg/dL 60 mg/dL for women. An HDL cholesterol of 60 mg/dL or higher gives some protection against heart disease.  The goal for triglycerides is less than 150 mg/dL. Influenza vaccine,  pneumococcal vaccine, and hepatitis B vaccine  The influenza vaccine is recommended yearly.  The pneumococcal vaccine is generally given once in a lifetime. However, there are some instances when another vaccination is recommended. Check with your health care provider.  The hepatitis B vaccine is also recommended for adults with diabetes. Diabetes self-management education  Education is recommended at diagnosis and ongoing as needed. Treatment plan  Your treatment plan is reviewed at every medical visit. Document Released:  07/14/2009 Document Revised: 05/19/2013 Document Reviewed: 02/16/2013 [facility] Patient Information 2014 [place].

## 2013-12-09 NOTE — Assessment & Plan Note (Signed)
Long hx same - hands feet On "neurontin" since 08/2013 but prior intol to gaba and amitrip and Lyrica reviewed The current medical regimen is effective;  continue present plan and medications.

## 2013-12-09 NOTE — Progress Notes (Signed)
Pre visit review using our clinic review tool, if applicable. No additional management support is needed unless otherwise documented below in the visit note. 

## 2013-12-09 NOTE — Assessment & Plan Note (Signed)
Prev follows with endo for same - but would rather follow here Reviewed home CBS - AM fasting >220 Add NPH to ongoing TID aspart Reviewed insulin resistance and poor tolerance of many oral medications in prior trials associated with neuropathy in feet - but intol of prior elavil trial - back on gaba since 08/2013   Lab Results  Component Value Date   HGBA1C 9.3* 10/20/2013

## 2013-12-09 NOTE — Progress Notes (Signed)
Subjective:    Patient ID: [name], female    DOB: 08-Jan-1946, 68 y.o.   MRN: [id number]  HPI Here for follow up - reviewed chronic medical issues and interval events:  Chronic "neuropathic" foot pain -ongoing feet numbness, bilateral - chronic issue for years - NCS 2007, repeated summer 2012 unremarkable by pt report (not available). Intol of amitrip, trazadone, effexor, cymbalta, and gabapentin - back on "neurontin" by hand since 08/2013 B CTS  anxiety - started on pristiq by obg after poor tol of other meds - uses prn xanax but ?other non habit forming med - changed to generic alternative venlafaxine 07/2012  DM2 - follows intermittently with endo for same - dx 2010 - intol of oral meds, thus on insulin. she was unable to tolerate metformin (diarrhea), januvia (headache), byetta (abdominal bruising), actos (edema), onglyza, glipizide and bromocrip. states cbg's are well-controlled. pt says her diet and exercise are "not as good as they could be".   hypertension - reports compliance with ongoing medical treatment and no changes in medication dose or frequency. denies adverse side effects related to current therapy. chronic LE edema, on furosemide  Dyslipidemia - intol of prior rx wth crestor, lipitor and zocor - poorly tolerated due to myalgias -   GERD - reports compliance with ongoing medical treatment and no changes in medication dose or frequency. denies adverse side effects related to current therapy.   Ulcerative colitis, chronic diarrhea associated with same - currently in remission on Remicade -no bleeding or abdominal pain symptoms at this time - follows with GI for same -   Past Medical History  Diagnosis Date  . GERD   . Osteoarthritis   . HYPERTENSION   . IDDM (insulin dependent diabetes mellitus)   . Ulcerative colitis     Remicade infusion Q 8 weeks  . History of thrombocytopenia 12/2011  . 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 Dental  bridge present     lower  . Anxiety   . Wears glasses     reading  . PONV (postoperative nausea and vomiting)      Review of Systems  Constitutional: Negative for fever and unexpected weight change.  Respiratory: Negative for cough and shortness of breath.   Cardiovascular: Negative for chest pain and palpitations.  Neurological: Negative for syncope and headaches.  Psychiatric/Behavioral: Positive for sleep disturbance. Negative for self-injury and dysphoric mood. The patient is not nervous/anxious.        Objective:   Physical Exam BP 130/82  Pulse 67  Temp(Src) 97.7 F (36.5 C) (Oral)  Wt 204 lb 12.8 oz (92.897 kg)  SpO2 96% Wt Readings from Last 3 Encounters:  12/09/13 204 lb 12.8 oz (92.897 kg)  12/07/13 207 lb 6.4 oz (94.076 kg)  11/29/13 203 lb (92.08 kg)   Constitutional: She is obese, but appears well-developed and well-nourished. No distress.  HENT: B TM hazy but no effusion or erythema - no cerumen - sinus nontender to palp Eyes: Conjunctivae and EOM are normal. Pupils are equal, round, and reactive to light. No scleral icterus.  Neck: Normal range of motion. Neck supple. No JVD present. No thyromegaly present.  Cardiovascular: Normal rate, regular rhythm and normal heart sounds.  No murmur heard. trace BLE edema/fatty ankles. Pulmonary/Chest: Effort normal and breath sounds normal. No respiratory distress. She has no wheezes.  Neurological: She is alert and oriented to person, place, and time. No cranial nerve deficit. Coordination, balance, strength, speech  and gait are normal.  Psychiatric: She has a normal mood and affect. Her behavior is normal. Judgment and thought content normal.     Lab Results  Component Value Date   WBC 8.6 11/30/2013   HGB 14.4 11/30/2013   HCT 40.7 11/30/2013   PLT 194 11/30/2013   CHOL 175 01/24/2011   TRIG 538.0* 01/24/2011   HDL 40.30 01/24/2011   LDLDIRECT 69.5 01/24/2011   ALT 29 11/30/2013   AST 32 11/30/2013   NA 138 11/30/2013   K  3.9 11/30/2013   CL 100 11/30/2013   CREATININE 0.82 11/30/2013   BUN 18 11/30/2013   CO2 26 11/30/2013   TSH 2.080 04/28/2012   INR 1.25 01/14/2012   HGBA1C 9.3* 10/20/2013   MICROALBUR 0.6 12/07/2012       Assessment & Plan:   Problem List Items Addressed This Visit   Allergic sinusitis     Add astelin and continue saline irrigation - erx done    Relevant Medications      azelastine (ASTELIN) nasal spray 137 mcg   DM (diabetes mellitus), type 2, uncontrolled - Primary      Prev follows with endo for same - but would rather follow here Reviewed home CBS - AM fasting >220 Add NPH to ongoing TID aspart Reviewed insulin resistance and poor tolerance of many oral medications in prior trials associated with neuropathy in feet - but intol of prior elavil trial - back on gaba since 08/2013   Lab Results  Component Value Date   HGBA1C 9.3* 10/20/2013      Relevant Medications      NOVOLIN N 100 UNIT/ML [place] SUSP      insulin aspart (NOVOLOG) 100 UNIT/ML FlexPen   Peripheral neuropathy     Long hx same - hands feet On "neurontin" since 08/2013 but prior intol to gaba and amitrip and Lyrica reviewed The current medical regimen is effective;  continue present plan and medications.     Relevant Medications      gabapentin (NEURONTIN) 100 MG capsule      venlafaxine (EFFEXOR-XR) 24 hr capsule    Other Visit Diagnoses   Type II or unspecified type diabetes mellitus with neurological manifestations, uncontrolled           Time spent with pt today 25 minutes, greater than 50% time spent counseling patient on neuropathic pain, diabetes, allergic rhinitis and medication review. Also review of prior records

## 2013-12-09 NOTE — Telephone Encounter (Signed)
Received PA form from [facility] need Pa on the Hum[employer] N form was completed and fax back. Waiting on approval status,,/lmb

## 2013-12-10 NOTE — Telephone Encounter (Signed)
Received PA back from [facility] insulin was approve. They have also notified pt...[name]

## 2013-12-16 ENCOUNTER — Telehealth: Payer: Self-pay

## 2013-12-16 NOTE — Telephone Encounter (Signed)
Relevant patient education mailed to patient.  

## 2013-12-21 ENCOUNTER — Ambulatory Visit (INDEPENDENT_AMBULATORY_CARE_PROVIDER_SITE_OTHER): Payer: Medicare Other | Admitting: Internal Medicine

## 2013-12-21 ENCOUNTER — Encounter (INDEPENDENT_AMBULATORY_CARE_PROVIDER_SITE_OTHER): Payer: Self-pay | Admitting: Internal Medicine

## 2013-12-21 VITALS — BP 130/80 | HR 74 | Temp 97.7°F | Resp 16 | Ht 61.0 in | Wt 205.3 lb

## 2013-12-21 DIAGNOSIS — IMO0001 Reserved for inherently not codable concepts without codable children: Secondary | ICD-10-CM

## 2013-12-21 DIAGNOSIS — K219 Gastro-esophageal reflux disease without esophagitis: Secondary | ICD-10-CM | POA: Diagnosis not present

## 2013-12-21 DIAGNOSIS — M791 Myalgia, unspecified site: Secondary | ICD-10-CM

## 2013-12-21 DIAGNOSIS — K5190 Ulcerative colitis, unspecified, without complications: Secondary | ICD-10-CM | POA: Diagnosis not present

## 2013-12-21 LAB — CK: CK TOTAL: 103 U/L (ref 7–177)

## 2013-12-21 NOTE — Patient Instructions (Addendum)
Physician will contact you with results of blood work. Please call Dr. Idamae Lusher  office to schedule CT of left lower extremity as he recommended

## 2013-12-21 NOTE — Progress Notes (Signed)
Presenting complaint;  Followup for ulcerative colitis and GERD.  Subjective:  Patient is 68 year old Caucasian female who is here for scheduled visit. She was last seen 4 months ago. She has chronic ulcerative colitis and now maintained on Remicade infliximab. She feels she is in remission. Her bowels are irregular. She goes from having normal stools 2 diarrhea to constipation. She denies rectal bleeding. She complains of paresthesias in her upper and lower extremities. She also complains of jerking and spasms in her muscles and stabbing pain in her feet. She also complains of back pain and knee pain. Heartburn is well controlled with therapy. She has good appetite. She has gained 7 pounds since her last visit 4 months ago. She has noted left lower extremity to be larger than the right one. She was examined by Dr. Barnet Glasgow earlier this month and she was advised to undergo CT but she is still thinking.  Current Medications: Outpatient Encounter Prescriptions as of 12/21/2013  Medication Sig  . azelastine (ASTELIN) 137 MCG/SPRAY nasal spray Place 2 sprays into both nostrils 2 (two) times daily. Use in each nostril as directed  . esomeprazole (NEXIUM) 40 MG capsule Take 1 capsule (40 mg total) by mouth 2 (two) times daily.  . furosemide (LASIX) 40 MG tablet TAKE 1 TABLET DAILY  . gabapentin (NEURONTIN) 100 MG capsule Take 100 mg by mouth 2 (two) times daily.  Marland Kitchen glucose blood test strip Test 3 times daily as directed  . inFLIXimab (REMICADE) 100 MG injection Inject into the vein every 8 (eight) weeks.   . insulin aspart (NOVOLOG) 100 UNIT/ML FlexPen 3 times a day (just before each meal) 110-130-130 units, and pen needles 3/day.  . Insulin Pen Needle 31G X 5 MM MISC USE AS DIRECTED BY PHYSICIAN. Test 4x/day  . loperamide (IMODIUM) 2 MG capsule Take 4 mg by mouth 2 (two) times daily.   . meclizine (ANTIVERT) 25 MG tablet Take 1 tablet (25 mg total) by mouth 3 (three) times daily as needed for  dizziness or nausea.  . metoprolol succinate (TOPROL-XL) 100 MG 24 hr tablet Take 1 tablet (100 mg total) by mouth 2 (two) times daily.  . Needle, Disp, (BD SAFETYGLIDE NEEDLE) 27G X 5/8" MISC Use to dispense insulin at bedtime Dx 250.00  . ONE TOUCH LANCETS MISC PATIENT USES ONE TOUCH DELICAL 78M LANCETS. USE AS DIRECTED TWICE DAILY'; DIAGNOSIS CODE 250.02  . OVER THE COUNTER MEDICATION Insync - Probiotic take 1 by mouth daily  . potassium chloride (KLOR-CON M10) 10 MEQ tablet TAKE 2 TABLETS (20 MEQ) DAILY  . venlafaxine XR (EFFEXOR-XR) 75 MG 24 hr capsule Take 1 capsule (75 mg total) by mouth daily.  . insulin NPH Human (NOVOLIN N) 100 UNIT/ML injection Inject 30 Units into the skin at bedtime.  . [DISCONTINUED] Flora-Q (FLORA-Q) CAPS Take 1 capsule by mouth daily.     Objective: Blood pressure 130/80, pulse 74, temperature 97.7 F (36.5 C), temperature source Oral, resp. rate 16, height 5' 1"  (1.549 m), weight 205 lb 4.8 oz (93.123 kg). Patient is alert and in no acute distress. Conjunctiva is pink. Sclera is nonicteric Oropharyngeal mucosa is normal. No neck masses or thyromegaly noted. Cardiac exam with regular rhythm normal S1 and S2. No murmur or gallop noted. Lungs are clear to auscultation. Abdomen is full. Bowel sounds are normal. Abdomen is soft and nontender without organomegaly or masses to  She has trace edema around ankles.  Labs/studies Results: Lab data from 11/30/2013 WBC 8.6, H&H 14.4 and  40.7 and platelet count 194K Differential reveals 25% neutrophils and 66% lymphocytes. Comprehensive chemistry panel normal except glucose of 162 and serum albumin of 3.4.  Assessment:  #1. Chronic ulcerative colitis. She remains in remission while an infliximab. 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 Flow cytometry was negative and therefore it appears to be benign process. One could switch her to another agent but I'm afraid she will relapse in  her disease has not been well controlled with mesalamine. #2. Muscle pain and spasms. #2. GERD. Symptoms are well controlled with therapy.  Plan:  Continue infliximab every 8 weeks for now. Patient encouraged to proceed with CT of left lower extremity. Will check CPK today. Office visit in 6 months.

## 2013-12-24 ENCOUNTER — Other Ambulatory Visit: Payer: Self-pay | Admitting: Dermatology

## 2013-12-24 DIAGNOSIS — L819 Disorder of pigmentation, unspecified: Secondary | ICD-10-CM | POA: Diagnosis not present

## 2013-12-24 DIAGNOSIS — L821 Other seborrheic keratosis: Secondary | ICD-10-CM | POA: Diagnosis not present

## 2013-12-24 DIAGNOSIS — C44711 Basal cell carcinoma of skin of unspecified lower limb, including hip: Secondary | ICD-10-CM | POA: Diagnosis not present

## 2013-12-31 ENCOUNTER — Other Ambulatory Visit: Payer: Self-pay | Admitting: Internal Medicine

## 2014-01-03 ENCOUNTER — Other Ambulatory Visit: Payer: Self-pay

## 2014-01-03 MED ORDER — "NEEDLE (DISP) 27G X 5/8"" MISC": Status: DC

## 2014-01-25 ENCOUNTER — Encounter (HOSPITAL_COMMUNITY)
Admission: RE | Admit: 2014-01-25 | Discharge: 2014-01-25 | Disposition: A | Discharge status: Home / Self Care | Payer: Medicare Other | Source: Ambulatory Visit | Attending: Internal Medicine | Admitting: Internal Medicine

## 2014-01-25 DIAGNOSIS — K5190 Ulcerative colitis, unspecified, without complications: Secondary | ICD-10-CM | POA: Diagnosis not present

## 2014-01-25 MED ORDER — LORATADINE 10 MG PO TABS
10.0000 mg | ORAL_TABLET | Freq: Every day | ORAL | Status: DC
Start: 2014-01-25 — End: 2014-01-26
  Administered 2014-01-25: 10 mg via ORAL

## 2014-01-25 MED ORDER — SODIUM CHLORIDE 0.9 % IV SOLN
INTRAVENOUS | Status: DC
  Administered 2014-01-25: 11:00:00 via INTRAVENOUS

## 2014-01-25 MED ORDER — SODIUM CHLORIDE 0.9 % IV SOLN
500.0000 mg | INTRAVENOUS | Status: DC
  Administered 2014-01-25: 500 mg via INTRAVENOUS
  Filled 2014-01-25: qty 50

## 2014-01-25 MED ORDER — ACETAMINOPHEN 325 MG PO TABS
ORAL_TABLET | ORAL | Status: AC
  Filled 2014-01-25: qty 2

## 2014-01-25 MED ORDER — LORATADINE 10 MG PO TABS
ORAL_TABLET | ORAL | Status: AC
  Filled 2014-01-25: qty 1

## 2014-01-25 MED ORDER — ACETAMINOPHEN 325 MG PO TABS
650.0000 mg | ORAL_TABLET | Freq: Once | ORAL | Status: AC
  Administered 2014-01-25: 650 mg via ORAL

## 2014-01-25 NOTE — Discharge Instructions (Signed)
Diabetes and Foot Care Diabetes may cause you to have problems because of poor blood supply (circulation) to your feet and legs. This may cause the skin on your feet to become thinner, break easier, and heal more slowly. Your skin may become dry, and the skin may peel and crack. You may also have nerve damage in your legs and feet causing decreased feeling in them. You may not notice minor injuries to your feet that could lead to infections or more serious problems. Taking care of your feet is one of the most important things you can do for yourself.  HOME CARE INSTRUCTIONS  Wear shoes at all times, even in the house. Do not go barefoot. Bare feet are easily injured.  Check your feet daily for blisters, cuts, and redness. If you cannot see the bottom of your feet, use a mirror or ask someone for help.  Wash your feet with warm water (do not use hot water) and mild soap. Then pat your feet and the areas between your toes until they are completely dry. Do not soak your feet as this can dry your skin.  Apply a moisturizing lotion or petroleum jelly (that does not contain alcohol and is unscented) to the skin on your feet and to dry, brittle toenails. Do not apply lotion between your toes.  Trim your toenails straight across. Do not dig under them or around the cuticle. File the edges of your nails with an emery board or nail file.  Do not cut corns or calluses or try to remove them with medicine.  Wear clean socks or stockings every day. Make sure they are not too tight. Do not wear knee-high stockings since they may decrease blood flow to your legs.  Wear shoes that fit properly and have enough cushioning. To break in new shoes, wear them for just a few hours a day. This prevents you from injuring your feet. Always look in your shoes before you put them on to be sure there are no objects inside.  Do not cross your legs. This may decrease the blood flow to your feet.  If you find a minor scrape,  cut, or break in the skin on your feet, keep it and the skin around it clean and dry. These areas may be cleansed with mild soap and water. Do not cleanse the area with peroxide, alcohol, or iodine.  When you remove an adhesive bandage, be sure not to damage the skin around it.  If you have a wound, look at it several times a day to make sure it is healing.  Do not use heating pads or hot water bottles. They may burn your skin. If you have lost feeling in your feet or legs, you may not know it is happening until it is too late.  Make sure your health care provider performs a complete foot exam at least annually or more often if you have foot problems. Report any cuts, sores, or bruises to your health care provider immediately. SEEK MEDICAL CARE IF:   You have an injury that is not healing.  You have cuts or breaks in the skin.  You have an ingrown nail.  You notice redness on your legs or feet.  You feel burning or tingling in your legs or feet.  You have pain or cramps in your legs and feet.  Your legs or feet are numb.  Your feet always feel cold. SEEK IMMEDIATE MEDICAL CARE IF:   There is increasing redness,  swelling, or pain in or around a wound.  There is a red line that goes up your leg.  Pus is coming from a wound.  You develop a fever or as directed by your health care provider.  You notice a bad smell coming from an ulcer or wound. Document Released: 09/13/2000 Document Revised: 05/19/2013 Document Reviewed: 02/23/2013 [facility] Patient Information 2014 [place]. Blood Glucose Monitoring, Adult Monitoring your blood glucose (also know as blood sugar) helps you to manage your diabetes. It also helps you and your health care provider monitor your diabetes and determine how well your treatment plan is working. WHY SHOULD YOU MONITOR YOUR BLOOD GLUCOSE?  It can help you understand how food, exercise, and medicine affect your blood glucose.  It allows you to  know what your blood glucose is at any given moment. You can quickly tell if you are having low blood glucose (hypoglycemia) or high blood glucose (hyperglycemia).  It can help you and your health care provider know how to adjust your medicines.  It can help you understand how to manage an illness or adjust medicine for exercise. WHEN SHOULD YOU TEST? Your health care provider will help you decide how often you should check your blood glucose. This may depend on the type of diabetes you have, your diabetes control, or the types of medicines you are taking. Be sure to write down all of your blood glucose readings so that this information can be reviewed with your health care provider. See below for examples of testing times that your health care provider may suggest. Type 1 Diabetes  Test 4 times a day if you are in good control, using an insulin pump, or perform multiple daily injections.  If your diabetes is not well-controlled or if you are sick, you may need to monitor more often.  It is a good idea to also monitor:  Before and after exercise.  Between meals and 2 hours after a meal.  Occasionally between 2:00 to 3:00 am. Type 2 Diabetes  It can vary with each person, but generally, if you are on insulin, test 4 times a day.  If you take medicines by mouth (orally), test 2 times a day.  If you are on a controlled diet, test once a day.  If your diabetes is not well controlled or if you are sick, you may need to monitor more often. HOW TO MONITOR YOUR BLOOD GLUCOSE Supplies Needed  Blood glucose meter.  Test strips for your meter. Each meter has its own strips. You must use the strips that go with your own meter.  A pricking needle (lancet).  A device that holds the lancet (lancing device).  A journal or log book to write down your results. Procedure  Wash your hands with soap and water. Alcohol is not preferred.  Prick the side of your finger (not the tip) with the  lancet.  Gently milk the finger until a small drop of blood appears.  Follow the instructions that come with your meter for inserting the test strip, applying blood to the strip, and using your blood glucose meter. Other Areas to Get Blood for Testing Some meters allow you to use other areas of your body (other than your finger) to test your blood. These areas are called alternative sites. The most common alternative sites are:  The forearm.  The thigh.  The back area of the lower leg.  The palm of the hand. The blood flow in these areas is  slower. Therefore, the blood glucose values you get may be delayed, and the numbers are different from what you would get from your fingers. Do not use alternative sites if you think you are having hypoglycemia. Your reading will not be accurate. Always use a finger if you are having hypoglycemia. Also, if you cannot feel your lows (hypoglycemia unawareness), always use your fingers for your blood glucose checks. ADDITIONAL TIPS FOR GLUCOSE MONITORING  Do not reuse lancets.  Always carry your supplies with you.  All blood glucose meters have a 24-hour "hotline" number to call if you have questions or need help.  Adjust (calibrate) your blood glucose meter with a control solution after finishing a few boxes of strips. BLOOD GLUCOSE RECORD KEEPING It is a good idea to keep a daily record or log of your blood glucose readings. Most glucose meters, if not all, keep your glucose records stored in the meter. Some meters come with the ability to download your records to your home computer. Keeping a record of your blood glucose readings is especially helpful if you are wanting to look for patterns. Make notes to go along with the blood glucose readings because you might forget what happened at that exact time. Keeping good records helps you and your health care provider to work together to achieve good diabetes management.  Document Released: 09/19/2003  Document Revised: 05/19/2013 Document Reviewed: 02/08/2013 [facility] Patient Information 2014 [place]. Diabetes and Exercise Exercising regularly is important. It is not just about losing weight. It has many health benefits, such as:  Improving your overall fitness, flexibility, and endurance.  Increasing your bone density.  Helping with weight control.  Decreasing your body fat.  Increasing your muscle strength.  Reducing stress and tension.  Improving your overall health. People with diabetes who exercise gain additional benefits because exercise:  Reduces appetite.  Improves the body's use of blood sugar (glucose).  Helps lower or control blood glucose.  Decreases blood pressure.  Helps control blood lipids (such as cholesterol and triglycerides).  Improves the body's use of the hormone insulin by:  Increasing the body's insulin sensitivity.  Reducing the body's insulin needs.  Decreases the risk for heart disease because exercising:  Lowers cholesterol and triglycerides levels.  Increases the levels of good cholesterol (such as high-density lipoproteins [HDL]) in the body.  Lowers blood glucose levels. YOUR ACTIVITY PLAN  Choose an activity that you enjoy and set realistic goals. Your health care provider or diabetes educator can help you make an activity plan that works for you. You can break activities into 2 or 3 sessions throughout the day. Doing so is as good as one long session. Exercise ideas include:  Taking the dog for a walk.  Taking the stairs instead of the elevator.  Dancing to your favorite song.  Doing your favorite exercise with a friend. RECOMMENDATIONS FOR EXERCISING WITH TYPE 1 OR TYPE 2 DIABETES   Check your blood glucose before exercising. If blood glucose levels are greater than 240 mg/dL, check for urine ketones. Do not exercise if ketones are present.  Avoid injecting insulin into areas of the body that are going to be  exercised. For example, avoid injecting insulin into:  The arms when playing tennis.  The legs when jogging.  Keep a record of:  Food intake before and after you exercise.  Expected peak times of insulin action.  Blood glucose levels before and after you exercise.  The type and amount of exercise you have done.  Review your records with your health care provider. Your health care provider will help you to develop guidelines for adjusting food intake and insulin amounts before and after exercising.  If you take insulin or oral hypoglycemic agents, watch for signs and symptoms of hypoglycemia. They include:  Dizziness.  Shaking.  Sweating.  Chills.  Confusion.  Drink plenty of water while you exercise to prevent dehydration or heat stroke. Body water is lost during exercise and must be replaced.  Talk to your health care provider before starting an exercise program to make sure it is safe for you. Remember, almost any type of activity is better than none. Document Released: 12/07/2003 Document Revised: 05/19/2013 Document Reviewed: 02/23/2013 [facility] Patient Information 2014 [place]. Diabetes Meal Planning Guide The diabetes meal planning guide is a tool to help you plan your meals and snacks. It is important for people with diabetes to manage their blood glucose (sugar) levels. Choosing the right foods and the right amounts throughout your day will help control your blood glucose. Eating right can even help you improve your blood pressure and reach or maintain a healthy weight. CARBOHYDRATE COUNTING MADE EASY When you eat carbohydrates, they turn to sugar. This raises your blood glucose level. Counting carbohydrates can help you control this level so you feel better. When you plan your meals by counting carbohydrates, you can have more flexibility in what you eat and balance your medicine with your food intake. Carbohydrate counting simply means adding up the total  amount of carbohydrate grams in your meals and snacks. Try to eat about the same amount at each meal. Foods with carbohydrates are listed below. Each portion below is 1 carbohydrate serving or 15 grams of carbohydrates. Ask your dietician how many grams of carbohydrates you should eat at each meal or snack. Grains and Starches  1 slice bread.   English muffin or hotdog/hamburger bun.   cup cold cereal (unsweetened).   cup cooked pasta or rice.   cup starchy vegetables (corn, potatoes, peas, beans, winter squash).  1 tortilla (6 inches).   bagel.  1 waffle or pancake (size of a CD).   cup cooked cereal.  4 to 6 small crackers. *Whole grain is recommended. Fruit  1 cup fresh unsweetened berries, melon, papaya, pineapple.  1 small fresh fruit.   banana or mango.   cup fruit juice (4 oz unsweetened).   cup canned fruit in natural juice or water.  2 tbs dried fruit.  12 to 15 grapes or cherries. Milk and Yogurt  1 cup fat-free or 1% milk.  1 cup soy milk.  6 oz light yogurt with sugar-free sweetener.  6 oz low-fat soy yogurt.  6 oz plain yogurt. Vegetables  1 cup raw or  cup cooked is counted as 0 carbohydrates or a "free" food.  If you eat 3 or more servings at 1 meal, count them as 1 carbohydrate serving. Other Carbohydrates   oz chips or pretzels.   cup ice cream or frozen yogurt.   cup sherbet or sorbet.  2 inch square cake, no frosting.  1 tbs honey, sugar, jam, jelly, or syrup.  2 small cookies.  3 squares of graham crackers.  3 cups popcorn.  6 crackers.  1 cup broth-based soup.  Count 1 cup casserole or other mixed foods as 2 carbohydrate servings.  Foods with less than 20 calories in a serving may be counted as 0 carbohydrates or a "free" food. You may want to purchase a book  or computer software that lists the carbohydrate gram counts of different foods. In addition, the nutrition facts panel on the labels of the foods  you eat are a good source of this information. The label will tell you how big the serving size is and the total number of carbohydrate grams you will be eating per serving. Divide this number by 15 to obtain the number of carbohydrate servings in a portion. Remember, 1 carbohydrate serving equals 15 grams of carbohydrate. SERVING SIZES Measuring foods and serving sizes helps you make sure you are getting the right amount of food. The list below tells how big or small some common serving sizes are.  1 oz.........4 stacked dice.  3 oz........Marland KitchenDeck of cards.  1 tsp.......Marland KitchenTip of little finger.  1 tbs......Marland KitchenMarland KitchenThumb.  2 tbs.......Marland KitchenGolf ball.   cup......Marland KitchenHalf of a fist.  1 cup.......Marland KitchenA fist. SAMPLE DIABETES MEAL PLAN Below is a sample meal plan that includes foods from the grain and starches, dairy, vegetable, fruit, and meat groups. A dietician can individualize a meal plan to fit your calorie needs and tell you the number of servings needed from each food group. However, controlling the total amount of carbohydrates in your meal or snack is more important than making sure you include all of the food groups at every meal. You may interchange carbohydrate containing foods (dairy, starches, and fruits). The meal plan below is an example of a 2000 calorie diet using carbohydrate counting. This meal plan has 17 carbohydrate servings. Breakfast  1 cup oatmeal (2 carb servings).   cup light yogurt (1 carb serving).  1 cup blueberries (1 carb serving).   cup almonds. Snack  1 large apple (2 carb servings).  1 low-fat string cheese stick. Lunch  Chicken breast salad.  1 cup spinach.   cup chopped tomatoes.  2 oz chicken breast, sliced.  2 tbs low-fat [place] dressing.  12 whole-wheat crackers (2 carb servings).  12 to 15 grapes (1 carb serving).  1 cup low-fat milk (1 carb serving). Snack  1 cup carrots.   cup hummus (1 carb serving). Dinner  3 oz broiled  salmon.  1 cup brown rice (3 carb servings). Snack  1  cups steamed broccoli (1 carb serving) drizzled with 1 tsp olive oil and lemon juice.  1 cup light pudding (2 carb servings). DIABETES MEAL PLANNING WORKSHEET Your dietician can use this worksheet to help you decide how many servings of foods and what types of foods are right for you.  BREAKFAST Food Group and Servings / Carb Servings Grain/Starches __________________________________ Dairy __________________________________________ Vegetable ______________________________________ Fruit ___________________________________________ Meat __________________________________________ Fat ____________________________________________ LUNCH Food Group and Servings / Carb Servings Grain/Starches ___________________________________ Dairy ___________________________________________ Fruit ____________________________________________ Meat ___________________________________________ Fat _____________________________________________ Wonda [employer] Food Group and Servings / Carb Servings Grain/Starches ___________________________________ Dairy ___________________________________________ Fruit ____________________________________________ Meat ___________________________________________ Fat _____________________________________________ SNACKS Food Group and Servings / Carb Servings Grain/Starches ___________________________________ Dairy ___________________________________________ Vegetable _______________________________________ Fruit ____________________________________________ Meat ___________________________________________ Fat _____________________________________________ DAILY TOTALS Starches _________________________ Vegetable ________________________ Fruit ____________________________ Dairy ____________________________ Meat ____________________________ Fat ______________________________ Document Released: 06/13/2005 Document Revised:  12/09/2011 Document Reviewed: 04/24/2009 [employer] Patient Information 2014 [place].

## 2014-01-31 ENCOUNTER — Ambulatory Visit (INDEPENDENT_AMBULATORY_CARE_PROVIDER_SITE_OTHER): Payer: Medicare Other | Admitting: Internal Medicine

## 2014-01-31 ENCOUNTER — Encounter: Payer: Self-pay | Admitting: Internal Medicine

## 2014-01-31 ENCOUNTER — Other Ambulatory Visit (INDEPENDENT_AMBULATORY_CARE_PROVIDER_SITE_OTHER): Payer: Medicare Other

## 2014-01-31 VITALS — BP 122/90 | HR 72 | Temp 97.8°F | Wt 212.1 lb

## 2014-01-31 DIAGNOSIS — E1149 Type 2 diabetes mellitus with other diabetic neurological complication: Secondary | ICD-10-CM

## 2014-01-31 DIAGNOSIS — G609 Hereditary and idiopathic neuropathy, unspecified: Secondary | ICD-10-CM | POA: Diagnosis not present

## 2014-01-31 DIAGNOSIS — G629 Polyneuropathy, unspecified: Secondary | ICD-10-CM

## 2014-01-31 LAB — HEMOGLOBIN A1C: Hgb A1c MFr Bld: 8.3 % — ABNORMAL HIGH (ref 4.6–6.5)

## 2014-01-31 MED ORDER — INSULIN ASPART 100 UNIT/ML FLEXPEN: PEN_INJECTOR | SUBCUTANEOUS | Status: DC

## 2014-01-31 MED ORDER — GABAPENTIN 100 MG PO CAPS: 100.0000 mg | ORAL_CAPSULE | Freq: Two times a day (BID) | ORAL | Status: DC

## 2014-01-31 MED ORDER — INSULIN DETEMIR 100 UNIT/ML ~~LOC~~ SOLN
50.0000 [IU] | Freq: Every day | SUBCUTANEOUS | Status: DC
Start: 2014-01-31 — End: 2014-05-18

## 2014-01-31 NOTE — Assessment & Plan Note (Signed)
Prev follows with endo ([facility]) for same - but would rather follow here Reviewed home CBS - AM fasting >220 Added NPH 11/2013 to ongoing TID aspart - no significant change Change NPH to Levemir (spouse on same) and refer back to endo (outside provider) Reviewed insulin resistance and poor tolerance of many oral medications in prior trials associated with neuropathy in feet - but intol of prior elavil trial - back on gaba since 08/2013   Lab Results  Component Value Date   HGBA1C 9.3* 10/20/2013

## 2014-01-31 NOTE — Assessment & Plan Note (Signed)
Long hx same - hands feet On "neurontin" since 08/2013 but prior intol to gaba and amitrip and Lyrica reviewed Advise reeval with neuro as needed -prior eval with Jacelyn Grip reviewed

## 2014-01-31 NOTE — Progress Notes (Signed)
Pre visit review using our clinic review tool, if applicable. No additional management support is needed unless otherwise documented below in the visit note. 

## 2014-01-31 NOTE — Patient Instructions (Signed)
It was good to see you today.  We have reviewed your prior records including labs and tests today  Test(s) ordered today. Your results will be released to [place] (or called to you) after review, usually within 72hours after test completion. If any changes need to be made, you will be notified at that same time.  Medications reviewed and updated Change NPH to Levemir at bedtime - take 50 units at bedtime No other changes recommended at this time.  we'll make referral to new endocrinologist and nutritionist at [facility]. Our office will contact you regarding appointment(s) once made.  Please schedule followup in 3-4 months, call sooner if problems.

## 2014-01-31 NOTE — Progress Notes (Signed)
Subjective:    Patient ID: [name], female    DOB: Dec 27, 1945, 68 y.o.   MRN: [id number]  HPI  Patient is here for follow up  Reviewed chronic medical issues and interval medical events  Past Medical History  Diagnosis Date  . GERD   . Osteoarthritis   . HYPERTENSION   . IDDM (insulin dependent diabetes mellitus)   . Ulcerative colitis     Remicade infusion Q 8 weeks  . History of thrombocytopenia 12/2011  . 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 Dental bridge present     lower  . Anxiety   . Wears glasses     reading  . PONV (postoperative nausea and vomiting)     Review of Systems  Constitutional: Positive for fatigue. Negative for fever and unexpected weight change.  Respiratory: Negative for cough and shortness of breath.   Cardiovascular: Negative for chest pain and leg swelling.  Neurological: Positive for numbness (chronic hands and feet). Negative for weakness.       Objective:   Physical Exam  BP 122/90  Pulse 72  Temp(Src) 97.8 F (36.6 C) (Oral)  Wt 212 lb 1.9 oz (96.217 kg)  SpO2 95% Wt Readings from Last 3 Encounters:  01/31/14 212 lb 1.9 oz (96.217 kg)  01/25/14 203 lb 6 oz (92.25 kg)  12/21/13 205 lb 4.8 oz (93.123 kg)    Constitutional: She is obese, but appears well-developed and well-nourished. No distress.  Neck: Normal range of motion. Neck supple. No JVD present. No thyromegaly present.  Cardiovascular: Normal rate, regular rhythm and normal heart sounds.  No murmur heard. No BLE edema. Pulmonary/Chest: Effort normal and breath sounds normal. No respiratory distress. She has no wheezes.  Psychiatric: She has a normal mood and affect. Her behavior is normal. Judgment and thought content normal.   Lab Results  Component Value Date   WBC 8.6 11/30/2013   HGB 14.4 11/30/2013   HCT 40.7 11/30/2013   PLT 194 11/30/2013   GLUCOSE 162* 11/30/2013   CHOL 175 01/24/2011   TRIG 538.0* 01/24/2011   HDL 40.30 01/24/2011   LDLDIRECT 69.5 01/24/2011   LDLCALC 80 01/26/2010   ALT 29 11/30/2013   AST 32 11/30/2013   NA 138 11/30/2013   K 3.9 11/30/2013   CL 100 11/30/2013   CREATININE 0.82 11/30/2013   BUN 18 11/30/2013   CO2 26 11/30/2013   TSH 2.080 04/28/2012   INR 1.25 01/14/2012   HGBA1C 9.3* 10/20/2013   MICROALBUR 0.6 12/07/2012         Assessment & Plan:   Problem List Items Addressed This Visit   DM (diabetes mellitus), type 2, uncontrolled - Primary      Prev follows with endo ([facility]) for same - but would rather follow here Reviewed home CBS - AM fasting >220 Added NPH 11/2013 to ongoing TID aspart - no significant change Change NPH to Levemir (spouse on same) and refer back to endo (outside provider) Reviewed insulin resistance and poor tolerance of many oral medications in prior trials associated with neuropathy in feet - but intol of prior elavil trial - back on gaba since 08/2013   Lab Results  Component Value Date   HGBA1C 9.3* 10/20/2013      Peripheral neuropathy     Long hx same - hands feet On "neurontin" since 08/2013 but prior intol to gaba and amitrip and Lyrica reviewed Advise reeval with neuro as needed -prior  eval with Jacelyn Grip reviewed

## 2014-01-31 NOTE — Addendum Note (Signed)
Addended by: Earnstine Regal on: 01/31/2014 12:23 PM   Modules accepted: Orders

## 2014-02-05 LAB — VITAMIN B6: Vitamin B6: 12.9 ng/mL (ref 2.1–21.7)

## 2014-02-07 ENCOUNTER — Telehealth: Payer: Self-pay

## 2014-02-07 DIAGNOSIS — G629 Polyneuropathy, unspecified: Secondary | ICD-10-CM

## 2014-02-07 NOTE — Telephone Encounter (Signed)
[facility] patient   Patient states she is experiencing numbness in her hands, arms, and feet and would like a referral to Dr Opal Sidles.

## 2014-02-07 NOTE — Telephone Encounter (Signed)
This does not sound like a localized neurologic deficit which would be addressed by a neurosurgeon. It sounds like it may be related to diabetes and would be called a polyneuropathy. She should see Dr Francella Solian for further testing or imaging.

## 2014-02-08 NOTE — Telephone Encounter (Signed)
Refer to NSurg done as requested (if it will be accepted by [facility] office) - I agree w/ Linus Orn

## 2014-02-08 NOTE — Telephone Encounter (Signed)
I called patient and let her know of Dr Hopper's comments. She would like to be referred to Dr Opal Sidles. She is aware you are out of the office until Monday.

## 2014-02-14 ENCOUNTER — Telehealth: Payer: Self-pay | Admitting: Internal Medicine

## 2014-02-14 NOTE — Telephone Encounter (Signed)
Per Jeani Hawking from [facility] said Dr Joya Salm reviewed and said pt need to see neurology

## 2014-02-15 NOTE — Telephone Encounter (Signed)
Let pt know same No new order at this time

## 2014-02-18 ENCOUNTER — Telehealth: Payer: Self-pay | Admitting: *Deleted

## 2014-02-18 NOTE — Telephone Encounter (Signed)
Pt called requesting excuse note from [employer].  Please advise

## 2014-02-19 NOTE — Telephone Encounter (Signed)
Ok to generate and i will sign Please let pt know this note will not guarantee release from jury duty thanks

## 2014-02-22 ENCOUNTER — Encounter: Payer: Self-pay | Admitting: *Deleted

## 2014-02-22 NOTE — Telephone Encounter (Signed)
Spoke with [employer].  Pt will pick up on 5.27.15

## 2014-02-23 ENCOUNTER — Other Ambulatory Visit: Payer: Self-pay | Admitting: Obstetrics & Gynecology

## 2014-02-23 DIAGNOSIS — R921 Mammographic calcification found on diagnostic imaging of breast: Secondary | ICD-10-CM

## 2014-02-23 DIAGNOSIS — Z09 Encounter for follow-up examination after completed treatment for conditions other than malignant neoplasm: Secondary | ICD-10-CM

## 2014-02-25 DIAGNOSIS — G5600 Carpal tunnel syndrome, unspecified upper limb: Secondary | ICD-10-CM | POA: Diagnosis not present

## 2014-02-25 DIAGNOSIS — IMO0002 Reserved for concepts with insufficient information to code with codable children: Secondary | ICD-10-CM | POA: Diagnosis not present

## 2014-03-01 DIAGNOSIS — M1710 Unilateral primary osteoarthritis, unspecified knee: Secondary | ICD-10-CM | POA: Diagnosis not present

## 2014-03-01 DIAGNOSIS — M25569 Pain in unspecified knee: Secondary | ICD-10-CM | POA: Diagnosis not present

## 2014-03-02 ENCOUNTER — Other Ambulatory Visit: Payer: Self-pay | Admitting: Endocrinology

## 2014-03-04 ENCOUNTER — Encounter: Payer: Self-pay | Admitting: Neurology

## 2014-03-04 ENCOUNTER — Ambulatory Visit (INDEPENDENT_AMBULATORY_CARE_PROVIDER_SITE_OTHER): Payer: Medicare Other | Admitting: Neurology

## 2014-03-04 VITALS — BP 160/80 | HR 71 | Temp 98.2°F | Ht 62.0 in | Wt 206.0 lb

## 2014-03-04 DIAGNOSIS — G622 Polyneuropathy due to other toxic agents: Secondary | ICD-10-CM | POA: Diagnosis not present

## 2014-03-04 DIAGNOSIS — K5190 Ulcerative colitis, unspecified, without complications: Secondary | ICD-10-CM

## 2014-03-04 DIAGNOSIS — G609 Hereditary and idiopathic neuropathy, unspecified: Secondary | ICD-10-CM | POA: Diagnosis not present

## 2014-03-04 DIAGNOSIS — G619 Inflammatory polyneuropathy, unspecified: Secondary | ICD-10-CM | POA: Diagnosis not present

## 2014-03-04 DIAGNOSIS — G629 Polyneuropathy, unspecified: Secondary | ICD-10-CM

## 2014-03-04 LAB — VITAMIN B12: VITAMIN B 12: 408 pg/mL (ref 211–911)

## 2014-03-04 LAB — TSH: TSH: 1.4 u[IU]/mL (ref 0.350–4.500)

## 2014-03-04 LAB — SEDIMENTATION RATE: Sed Rate: 4 mm/hr (ref 0–22)

## 2014-03-04 LAB — C-REACTIVE PROTEIN

## 2014-03-04 MED ORDER — LIDOCAINE 5 % EX OINT: TOPICAL_OINTMENT | CUTANEOUS | Status: DC

## 2014-03-04 NOTE — Progress Notes (Signed)
[facility] Clinic Note - Initial Visit   Date: 03/04/2014  [name] MRN: [id number] DOB: 12/03/1945   Dear Dr Daylene Katayama:  Thank you for your kind referral of [name] for consultation of neuropathy. Although her history is well known to you, please allow [place] to reiterate it for the purpose of our medical record. The patient was accompanied to the clinic by husband who also provides collateral information.     History of Present Illness: [name] is a 68 y.o. right-handed Caucasian female with history of hyperlipidemia, diabetes mellitus (dx~2010, HbA1c 8.3, on insulin), hypertension, ulcerative colitis (dx 1980s, on Remicade, followed by Dr. Laural [name] in [place]), and GERD presenting for evaluation of painful neuropathy of the hands and feet.  For the past 20 years, she has developed burning pain of the hands and feet.  Pain initially started in her feet, described as burning and intermittent.  Over the past 5 years, symptoms have worsened in because now it involves her hands and she has associated swelling and tightness. She complains of predominately burning pain involving the toes and fingers, with mild numbness over the tips of her fingers.  Heat improves her pain and "cold makes them feel as if they are going to fall off."  Activity tends to improve stiffness, but worsens her pain.  She has previously been seen by Dr. Jacelyn Grip here from 06/2011 - 04/2012 for neuropathy. At the time of her initial presentation in 2012, she was not on immunosuppressive medication and do to concerns of peripheral neuropathy secondary to ulcerative colitis, she was eventually started on Remicade. Since starting Remicade infusions, there has been no change, if not, reports feeling there may be worsening. She has previously tried gabapentin, amitriptyline, and nortriptyline.    She has been seeing Dr. Daylene Katayama, orthopedic physician, for handed dysesthesias.  She underwent EMG in  2014 which are bilateral carpal tunnel syndrome worse on the right side. She initially underwent right CTS release in August which helped her symptoms 30%, so also underwent release on the left which reduced pain by 25%.  Despite her CTS release, she continues to have severe neuropathic pain. She was started on neurontin, but developed hand and feet swelling so it was stopped.  She endorses sweating less, dry mouth, and reports to having history surgery for dry eyes.  Out-side paper records, electronic medical record, and images have been reviewed where available and summarized as:  EMG performed 03/17/2013 at [place] [place]: Shows bilateral carpal tunnel syndrome, mild to moderate in degree, and worse on the right.  MRI cervical spine wo contrast 12/05/2011: 1. Spondylosis and broad-based central disc protrusion at C5-C6 contribute to moderate spinal stenosis with probable mild left greater than right foraminal narrowing. Foraminal assessment is mildly motion limited; consider oblique plain film correlation. No abnormal cord signal is seen.  2. Asymmetric facet hypertrophy on the left at C6-C7 contributes to mild to moderate left foraminal stenosis.  3. No other significant spinal stenosis or nerve root encroachment identified.  MRI cervical spine wo contrast 05/12/2012: 1. New focal soft disc protrusion at C5-6 into the right lateral recess compressing the right C6 nerve and slightly compressing the  adjacent spinal cord.  2. No other significant change since the prior study.  Lab Results  Component Value Date   HGBA1C 8.3* 01/31/2014   Lab Results  Component Value Date   JSEGBTDV76 160 06/26/2011   Lab Results  Component Value Date   TSH 2.080 04/28/2012  Past Medical History  Diagnosis Date  . GERD   . Osteoarthritis   . HYPERTENSION   . IDDM (insulin dependent diabetes mellitus)   . Ulcerative colitis     Remicade infusion Q 8 weeks  . History of thrombocytopenia 12/2011  .  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 Dental bridge present     lower  . Anxiety   . Wears glasses     reading  . PONV (postoperative nausea and vomiting)     Past Surgical History  Procedure Laterality Date  . Abdominal hysterectomy  1980    partial  . Breast reduction surgery  1994  . Rectocele repair  1990; 09/12/2006  . Carpal tunnel release Right 1996  . Knee arthroscopy Right 01/1999; 10/2000  . Hemilaminotomy lumbar spine Bilateral 09/07/1999    L4-5  . Cholecystectomy    . Flexible sigmoidoscopy  01/17/2012    Procedure: FLEXIBLE SIGMOIDOSCOPY;  Surgeon: Rogene Houston, MD;  Location: [facility];  Service: Endoscopy;  Laterality: N/A;  . Bilateral salpingoophorectomy  02/10/2001  . Tarsal tunnel release  2002  . Ureterolysis Right 02/10/2001  . Lysis of adhesion  02/10/2001  . Carpal tunnel release Left 03/21/2003  . 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 Esophagogastroduodenoscopy (egd) with esophageal dilation  12/02/2005  . Carpal tunnel release Right 05/04/2013    Procedure: CARPAL TUNNEL RELEASE;  Surgeon: Cammie Sickle., MD;  Location: [facility];  Service: Orthopedics;  Laterality: Right;  . Carpal tunnel release Left 09/21/2013    Procedure: LEFT CARPAL TUNNEL RELEASE;  Surgeon: Cammie Sickle., MD;  Location: [facility];  Service: Orthopedics;  Laterality: Left;     Medications:  Current Outpatient Prescriptions on File Prior to Visit  Medication Sig Dispense Refill  . azelastine (ASTELIN) 137 MCG/SPRAY nasal spray Place 2 sprays into both nostrils 2 (two) times daily. Use in each nostril as directed  30 mL  2  . B-D INS SYR ULTRAFINE 1CC/31G 31G X 5/16" 1 ML MISC USE AS DIRECTED THREE TIMES DAILY DX 250.02  100 each  2  . esomeprazole (NEXIUM) 40 MG capsule Take 1 capsule (40 mg total) by mouth 2 (two) times daily.  180 capsule  3  . furosemide (LASIX) 40 MG tablet TAKE 1  TABLET DAILY  90 tablet  3  . gabapentin (NEURONTIN) 100 MG capsule Take 1 capsule (100 mg total) by mouth 2 (two) times daily.  180 capsule  3  . glucose blood test strip Test 3 times daily as directed  300 each  2  . inFLIXimab (REMICADE) 100 MG injection Inject into the vein every 8 (eight) weeks.       . insulin aspart (NOVOLOG) 100 UNIT/ML FlexPen 3 times a day (just before each meal) 130-130-130 units, and pen needles 3/day.  40 pen  2  . insulin detemir (LEVEMIR) 100 UNIT/ML injection Inject 0.5 mLs (50 Units total) into the skin at bedtime.  10 mL  11  . Insulin Pen Needle 31G X 5 MM MISC USE AS DIRECTED BY PHYSICIAN. Test 4x/day  200 each  3  . loperamide (IMODIUM) 2 MG capsule Take 4 mg by mouth 2 (two) times daily.       . meclizine (ANTIVERT) 25 MG tablet Take 1 tablet (25 mg total) by mouth 3 (three) times daily as needed for dizziness or nausea.  90 tablet  0  . metoprolol succinate (TOPROL-XL) 100 MG 24 hr tablet Take 1 tablet (100 mg total) by mouth 2 (two) times daily.  180 tablet  3  . Needle, Disp, (BD SAFETYGLIDE NEEDLE) 27G X 5/8" MISC Use to dispense insulin at bedtime Dx 250.00  90 each  3  . ONE TOUCH LANCETS MISC PATIENT USES ONE TOUCH DELICAL 42P LANCETS. USE AS DIRECTED TWICE DAILY'; DIAGNOSIS CODE 250.02      . ONETOUCH DELICA LANCETS 53I MISC PATIENT USES ONE TOUCH DELICAL 14E LANCETS. USE AS DIRECTED THREE DAILY' DIAGNOSIS CODE 250.02  300 each  2  . OVER THE COUNTER MEDICATION Insync - Probiotic take 1 by mouth daily      . potassium chloride (KLOR-CON M10) 10 MEQ tablet TAKE 2 TABLETS (20 MEQ) DAILY  180 tablet  3  . [DISCONTINUED] amitriptyline (ELAVIL) 25 MG tablet Take 1 tablet (25 mg total) by mouth at bedtime. start with 1/2 tab at night for 1 week then increase to 1 whole tab.  30 tablet  3  . [DISCONTINUED] bromocriptine (PARLODEL) 2.5 MG tablet Take 2.5 mg by mouth 2 (two) times daily.        . [DISCONTINUED] potassium chloride (K-DUR) 10 MEQ tablet Take 2  tablets (20 mEq total) by mouth daily.  90 tablet  1   No current facility-administered medications on file prior to visit.    Allergies:  Allergies  Allergen Reactions  . Actos [Pioglitazone] Other (See Comments)    EDEMA   . Benzocaine-Menthol Swelling    SWELLING OF MOUTH  . Colesevelam Other (See Comments)    GI UPSET  . Flagyl [Metronidazole Hcl] Other (See Comments)    DIAPHORESIS  . Metformin And Related Diarrhea  . Omeprazole Swelling    SWELLING OF TONGUE AND THROAT  . Shrimp [Shellfish Allergy] Itching    OF THROAT AND EARS  . Statins Other (See Comments)    HEART RACING  . Hydromorphone Itching  . Adhesive [Tape] Other (See Comments)    SKIN IRRITATION AND BRUISING  . Nisoldipine Itching  . Percocet [Oxycodone-Acetaminophen] Itching    Family History: Family History  Problem Relation Age of Onset  . Diabetes Mother   . Hypertension Mother   . Heart attack Father     Mid 67's  . Heart disease Father   . Lung disease Father     spot on lung; had lung surgery  . Alcohol abuse Other   . Hypertension Son   . Diabetes Son     Social History: History   Social History  . Marital Status: Married    Spouse Name: N/A    Number of Children: N/A  . Years of Education: N/A   Occupational History  . Not on file.   Social History Main Topics  . Smoking status: Never Smoker   . Smokeless tobacco: Never Used  . Alcohol Use: No  . Drug Use: No  . Sexual Activity: Yes    Partners: Male    Birth Control/ Protection: Surgical     Comment: hysterectomy   Other Topics Concern  . Not on file   Social History Narrative   She lives with husband in two-story home.  They have one grown son and 2 grandchildren.   She is retired [employer].   Highest of level education:  Some college    Review of Systems:  CONSTITUTIONAL: No fevers, chills, night sweats, or weight loss.   EYES: No  visual changes or eye pain ENT: No hearing changes.  No history of  nose bleeds.   RESPIRATORY: No cough, wheezing and shortness of breath.   CARDIOVASCULAR: Negative for chest pain, and palpitations.   GI: +for abdominal discomfort, blood in stools or black stools.  No recent change in bowel habits.   GU:  No history of incontinence.   MUSCLOSKELETAL: +history of joint pain or swelling.  No myalgias.   SKIN: Negative for lesions, rash, and itching.   HEMATOLOGY/ONCOLOGY: Negative for prolonged bleeding, bruising easily, and swollen nodes.  N ENDOCRINE: Negative for cold or heat intolerance, polydipsia or goiter.   PSYCH:  No depression or anxiety symptoms.   NEURO: As Above.   Vital Signs:  BP 160/80  Pulse 71  Temp(Src) 98.2 F (36.8 C) (Oral)  Ht 5' 2"  (1.575 m)  Wt 206 lb (93.441 kg)  BMI 37.67 kg/m2  SpO2 98% Pain Scale: 8 on a scale of 0-10   General Medical Exam:   General:  Well appearing, comfortable.   Eyes/ENT: see cranial nerve examination.   Neck: No masses appreciated.  Full range of motion without tenderness.  No carotid bruits. Respiratory:  Clear to auscultation, good air entry bilaterally.   Cardiac:  Regular rate and rhythm, no murmur.   Extremities:  No deformities, edema, or skin discoloration. Good capillary refill.   Skin:  Skin color, texture, turgor normal. No rashes or lesions.  Neurological Exam: MENTAL STATUS including orientation to time, place, person, recent and remote memory, attention span and concentration, language, and fund of knowledge is normal.  Speech is not dysarthric.  CRANIAL NERVES: II:  No visual field defects.  Unremarkable fundi.   III-IV-VI: Pupils equal round and reactive to light.  Normal conjugate, extra-ocular eye movements in all directions of gaze.  No nystagmus.  No ptosis.   V:  Normal facial sensation.   VII:  Normal facial symmetry and movements.  Bilateral palmomental reflex.  VIII:  Normal hearing and vestibular function.   IX-X:  Normal palatal movement.   XI:  Normal shoulder  shrug and head rotation.   XII:  Normal tongue strength and range of motion, no deviation or fasciculation.  MOTOR:  No atrophy, fasciculations or abnormal movements.  No pronator drift.  Tone is normal.    Right Upper Extremity:    Left Upper Extremity:    Deltoid  5/5   Deltoid  5/5   Biceps  5/5   Biceps  5/5   Triceps  5/5   Triceps  5/5   Wrist extensors  5/5   Wrist extensors  5/5   Wrist flexors  5/5   Wrist flexors  5/5   Finger extensors  5/5   Finger extensors  5/5   Finger flexors  5/5   Finger flexors  5/5   Dorsal interossei  5/5   Dorsal interossei  5/5   Abductor pollicis  5/5   Abductor pollicis  5/5   Tone (Ashworth scale)  0  Tone (Ashworth scale)  0   Right Lower Extremity:    Left Lower Extremity:    Hip flexors  5/5   Hip flexors  5/5   Hip extensors  5/5   Hip extensors  5/5   Knee flexors  5/5   Knee flexors  5/5   Knee extensors  5/5   Knee extensors  5/5   Dorsiflexors  5/5   Dorsiflexors  5/5   Plantarflexors  5/5   Plantarflexors  5/5   Toe extensors  5/5   Toe extensors  5/5   Toe flexors  5/5   Toe flexors  5/5   Tone (Ashworth scale)  0  Tone (Ashworth scale)  0   MSRs:  Right                                                                 Left brachioradialis 2+  brachioradialis 2+  biceps 2+  biceps 2+  triceps 2+  triceps 2+  patellar 3+  patellar 3+  ankle jerk 1+  ankle jerk 1+  Hoffman no  Hoffman no  plantar response down  plantar response down   SENSORY:  Vibration is reduced to 50% at the knees bilaterally and absent at the great toe.  Temperature and pin prick reduced in the feet. Pin prick over the hands is intact.  Romberg's sign absent.   COORDINATION/GAIT: Normal finger-to- nose-finger and heel-to-shin.  Intact rapid alternating movements bilaterally.  Able to rise from a chair without using arms.  Gait narrow is assisted with cane (left knee pain), slow and wide-based.   IMPRESSION: Mrs. Feely is a 68 year-old female with  history of ulcerative colitis (on remicaide) and diabetes mellitus (on insulin, HbA1c 8.3)  presenting for evaluation painful paresthesias involving her hands and feet.  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  I had extensive discussion with the patient regarding the pathogenesis, etiology, management, and natural course of neuropathy, especially as it relates to ulcerative colitis and diabetes.  Further, I explained that it is not uncommon that her nerve conduction studies show essentially normal sensory responses because it only measure large fiber neuropathy, not small fiber neuropathy, which tends to be more common presentation in patients with inflammatory bowel disease.    For completeness, I will check for other potentially treatable causes of neuropathy including nutritional deficiencies and inflammation/other autoimmune diseases. Management is two-fold: (1)  treating the underlying disease process - ulcerative colitis and diabetes and (2) pain relief with medications.  She is already on Remicaide infusions and does not seem to be having any relief.  I will discuss with her gastroenterologist his thoughts on other immunomodulatory therapy or IVIG, which can be used for patients with small fiber neuropathy due to UC with IVIG who did not respond to conventional treatment.  For pain management, she seems to be very intolerant to medications so optimal dosing cannot be achieved.  Options include cymbalta, desipramine, venlafaxine, alpha lipoic acid, lidocaine ointment, as well as others not so commonly used for neuropathic pain such as lamictal, depakote, and topamax.  She would like to try topical lidocaine ointment and alpha lipoic acid first.    PLAN/RECOMMENDATIONS:  1.  Start using lidocaine ointment to hands and feet bilaterally. 2.  Check ESR, CRP, B12, copper, ceruloplasmin, TSH, ENA 3.  If lidocaine ointment does not help,  try alpha lipoic acid 400 daily 4.  Return to clinic in 6-weeks    The duration of this appointment visit was 85 minutes of face-to-face time with the patient.  Greater than 50% of this time was spent in counseling, explanation of diagnosis, planning of further management, and coordination of care.   Thank  you for allowing me to participate in patient's care.  If I can answer any additional questions, I would be pleased to do so.    Sincerely,    Amberlin Utke K. Posey Pronto, DO

## 2014-03-04 NOTE — Patient Instructions (Addendum)
1.  Start using lidocaine ointment to hands and feet bilaterally. 2.  Check blood work today 3.  If lidocaine ointment does not help, try alpha lipoic acid 400 daily 4.  Return to clinic in 6-weeks

## 2014-03-04 NOTE — Progress Notes (Signed)
Note faxed.

## 2014-03-06 LAB — COPPER, SERUM: Copper: 79 ug/dL (ref 70–175)

## 2014-03-07 LAB — ENA 9 PANEL
Centromere Ab Screen: <1
ENA SM Ab Ser-aCnc: <1
JO-1 ANTIBODY, IGG: NEGATIVE
RIBOSOMAL P PROTEIN AB: NEGATIVE
SM/RNP: <1
SSA (RO) (ENA) ANTIBODY, IGG: NEGATIVE
SSB (La) (ENA) Antibody, IgG: <1
Scleroderma (Scl-70) (ENA) Antibody, IgG: <1

## 2014-03-08 ENCOUNTER — Telehealth: Payer: Self-pay | Admitting: Neurology

## 2014-03-08 LAB — CERULOPLASMIN: CERULOPLASMIN: 23 mg/dL (ref 18–53)

## 2014-03-08 MED ORDER — VENLAFAXINE HCL 37.5 MG PO TABS: 37.5000 mg | ORAL_TABLET | Freq: Every day | ORAL | Status: DC

## 2014-03-08 NOTE — Telephone Encounter (Signed)
OK to start venlafaxine 37.20m daily - Rx sent to [facility].  Please notify patient.  Treysean Petruzzi K. PPosey Pronto DO

## 2014-03-08 NOTE — Telephone Encounter (Signed)
Patient notified

## 2014-03-11 ENCOUNTER — Ambulatory Visit: Payer: Medicare Other | Admitting: Internal Medicine

## 2014-03-14 LAB — HM DIABETES EYE EXAM

## 2014-03-16 ENCOUNTER — Ambulatory Visit (HOSPITAL_COMMUNITY)
Admission: RE | Admit: 2014-03-16 | Discharge: 2014-03-16 | Disposition: A | Discharge status: Home / Self Care | Payer: Medicare Other | Source: Ambulatory Visit | Attending: Obstetrics & Gynecology | Admitting: Obstetrics & Gynecology

## 2014-03-16 DIAGNOSIS — R928 Other abnormal and inconclusive findings on diagnostic imaging of breast: Secondary | ICD-10-CM | POA: Insufficient documentation

## 2014-03-16 DIAGNOSIS — R921 Mammographic calcification found on diagnostic imaging of breast: Secondary | ICD-10-CM

## 2014-03-16 DIAGNOSIS — Z09 Encounter for follow-up examination after completed treatment for conditions other than malignant neoplasm: Secondary | ICD-10-CM

## 2014-03-17 DIAGNOSIS — M1710 Unilateral primary osteoarthritis, unspecified knee: Secondary | ICD-10-CM | POA: Diagnosis not present

## 2014-03-21 DIAGNOSIS — M1710 Unilateral primary osteoarthritis, unspecified knee: Secondary | ICD-10-CM | POA: Diagnosis not present

## 2014-03-21 DIAGNOSIS — M25569 Pain in unspecified knee: Secondary | ICD-10-CM | POA: Diagnosis not present

## 2014-03-22 ENCOUNTER — Encounter (HOSPITAL_COMMUNITY)
Admission: RE | Admit: 2014-03-22 | Discharge: 2014-03-22 | Disposition: A | Discharge status: Home / Self Care | Payer: Medicare Other | Source: Ambulatory Visit | Attending: Internal Medicine | Admitting: Internal Medicine

## 2014-03-22 ENCOUNTER — Encounter (HOSPITAL_COMMUNITY): Payer: Self-pay

## 2014-03-22 DIAGNOSIS — K5190 Ulcerative colitis, unspecified, without complications: Secondary | ICD-10-CM | POA: Insufficient documentation

## 2014-03-22 MED ORDER — LORATADINE 10 MG PO TABS
ORAL_TABLET | ORAL | Status: AC
  Filled 2014-03-22: qty 1

## 2014-03-22 MED ORDER — SODIUM CHLORIDE 0.9 % IV SOLN
500.0000 mg | INTRAVENOUS | Status: AC
  Administered 2014-03-22: 500 mg via INTRAVENOUS
  Filled 2014-03-22: qty 50

## 2014-03-22 MED ORDER — ACETAMINOPHEN 325 MG PO TABS
650.0000 mg | ORAL_TABLET | Freq: Once | ORAL | Status: AC
  Administered 2014-03-22: 650 mg via ORAL

## 2014-03-22 MED ORDER — ACETAMINOPHEN 325 MG PO TABS
ORAL_TABLET | ORAL | Status: AC
  Filled 2014-03-22: qty 2

## 2014-03-22 MED ORDER — SODIUM CHLORIDE 0.9 % IV SOLN
Freq: Once | INTRAVENOUS | Status: AC
  Administered 2014-03-22: 250 mL via INTRAVENOUS

## 2014-03-22 MED ORDER — LORATADINE 10 MG PO TABS
10.0000 mg | ORAL_TABLET | Freq: Once | ORAL | Status: AC
  Administered 2014-03-22: 10 mg via ORAL

## 2014-03-29 ENCOUNTER — Telehealth (INDEPENDENT_AMBULATORY_CARE_PROVIDER_SITE_OTHER): Payer: Self-pay | Admitting: *Deleted

## 2014-03-29 NOTE — Telephone Encounter (Signed)
Rec'd a letter from [employer]. As of March 30, 2014 - plan will no longer cover Nexium unless a coverage review is completed and may cost more. Dr.Rehman has written a prescription for Nexium OTC, this should cost the patient only $5 copayment. Patient was called and made aware. She request that we mail it to her. Patient was asked to call our office once she has rec'd the prescription.

## 2014-03-31 ENCOUNTER — Telehealth: Payer: Self-pay | Admitting: *Deleted

## 2014-03-31 DIAGNOSIS — E1165 Type 2 diabetes mellitus with hyperglycemia: Secondary | ICD-10-CM

## 2014-03-31 DIAGNOSIS — IMO0002 Reserved for concepts with insufficient information to code with codable children: Secondary | ICD-10-CM

## 2014-03-31 NOTE — Telephone Encounter (Signed)
Patient coming in for a follow up appointment.  Lipid panel ordered.  Patient will come in fasting.

## 2014-04-19 ENCOUNTER — Ambulatory Visit: Payer: Medicare Other | Admitting: Neurology

## 2014-04-20 ENCOUNTER — Ambulatory Visit (INDEPENDENT_AMBULATORY_CARE_PROVIDER_SITE_OTHER): Payer: Medicare Other | Admitting: Neurology

## 2014-04-20 ENCOUNTER — Encounter: Payer: Self-pay | Admitting: Neurology

## 2014-04-20 VITALS — BP 128/80 | HR 78 | Ht 61.42 in | Wt 205.2 lb

## 2014-04-20 DIAGNOSIS — G622 Polyneuropathy due to other toxic agents: Secondary | ICD-10-CM

## 2014-04-20 DIAGNOSIS — H2510 Age-related nuclear cataract, unspecified eye: Secondary | ICD-10-CM | POA: Diagnosis not present

## 2014-04-20 DIAGNOSIS — G609 Hereditary and idiopathic neuropathy, unspecified: Secondary | ICD-10-CM | POA: Diagnosis not present

## 2014-04-20 DIAGNOSIS — M1710 Unilateral primary osteoarthritis, unspecified knee: Secondary | ICD-10-CM | POA: Diagnosis not present

## 2014-04-20 DIAGNOSIS — G619 Inflammatory polyneuropathy, unspecified: Secondary | ICD-10-CM

## 2014-04-20 DIAGNOSIS — K5190 Ulcerative colitis, unspecified, without complications: Secondary | ICD-10-CM

## 2014-04-20 DIAGNOSIS — G629 Polyneuropathy, unspecified: Secondary | ICD-10-CM

## 2014-04-20 DIAGNOSIS — E119 Type 2 diabetes mellitus without complications: Secondary | ICD-10-CM | POA: Diagnosis not present

## 2014-04-20 DIAGNOSIS — H04129 Dry eye syndrome of unspecified lacrimal gland: Secondary | ICD-10-CM | POA: Diagnosis not present

## 2014-04-20 MED ORDER — LIDOCAINE 5 % EX OINT: TOPICAL_OINTMENT | CUTANEOUS | Status: DC

## 2014-04-20 MED ORDER — DESIPRAMINE HCL 10 MG PO TABS: 10.0000 mg | ORAL_TABLET | Freq: Every day | ORAL | Status: DC

## 2014-04-20 NOTE — Progress Notes (Signed)
Follow-up Visit   Date: 04/20/2014    [name] MRN: [id number] DOB: Aug 22, 1946   Interim History: [name] is a 68 y.o. right-handed Caucasian female with history of hyperlipidemia, diabetes mellitus (dx~2010, HbA1c 8.3, on insulin), hypertension, ulcerative colitis (dx 1980s, on Remicade, followed by Dr. Laural [name] in [place]), and GERD returning to the clinic for follow-up of neuropathy.  The patient was accompanied to the clinic by self.  History of present illness: For the past 20 years, she has developed burning pain of the hands and feet. Pain initially started in her feet, described as burning and intermittent. Over the past 5 years, symptoms have worsened in because now it involves her hands and she has associated swelling and tightness. She complains of predominately burning pain involving the toes and fingers, with mild numbness over the tips of her fingers. Heat improves her pain and "cold makes them feel as if they are going to fall off." Activity tends to improve stiffness, but worsens her pain.   She has previously been seen by Dr. Jacelyn Grip here from 06/2011 - 04/2012 for neuropathy. At the time of her initial presentation in 2012, she was not on immunosuppressive medication and do to concerns of peripheral neuropathy secondary to ulcerative colitis, she was eventually started on Remicade. Since starting Remicade infusions, there has been no change, if not, reports feeling there may be worsening. She has previously tried gabapentin, amitriptyline, and nortriptyline.   She has been seeing Dr. Daylene Katayama, orthopedic physician, for handed dysesthesias. She underwent EMG in 2014 which are bilateral carpal tunnel syndrome worse on the right side. She initially underwent right CTS release in August which helped her symptoms 30%, so also underwent release on the left which reduced pain by 25%. Despite her CTS release, she continues to have severe neuropathic pain. She was started on  neurontin, but developed hand and feet swelling so it was stopped.   She endorses sweating less, dry mouth, and reports to having history surgery for dry eyes.   UPDATE 04/20/2014:  She was started on lidocaine ointment and venlafaxine 37.85m at her last visit, but stopped venlafaxine due to nausea.  It did seem to help with sharp shooting pains, but not enough to stay on it.  She is getting mild relief with lidocaine ointment to where she can rest at night. He continues to have a sensation of a band around her hands. Her ulcerative colitis seems to be fairly well-controlled at this time. She is planning on seeing endocrinology for her diabetes next week.   Medications:  Current Outpatient Prescriptions on File Prior to Visit  Medication Sig Dispense Refill  . azelastine (ASTELIN) 137 MCG/SPRAY nasal spray Place 2 sprays into both nostrils 2 (two) times daily. Use in each nostril as directed  30 mL  2  . B-D INS SYR ULTRAFINE 1CC/31G 31G X 5/16" 1 ML MISC USE AS DIRECTED THREE TIMES DAILY DX 250.02  100 each  2  . esomeprazole (NEXIUM) 40 MG capsule Take 1 capsule (40 mg total) by mouth 2 (two) times daily.  180 capsule  3  . furosemide (LASIX) 40 MG tablet TAKE 1 TABLET DAILY  90 tablet  3  . glucose blood test strip Test 3 times daily as directed  300 each  2  . inFLIXimab (REMICADE) 100 MG injection Inject into the vein every 8 (eight) weeks.       . insulin aspart (NOVOLOG) 100 UNIT/ML FlexPen 3 times a day (just  before each meal) 130-130-130 units, and pen needles 3/day.  40 pen  2  . insulin detemir (LEVEMIR) 100 UNIT/ML injection Inject 0.5 mLs (50 Units total) into the skin at bedtime.  10 mL  11  . Insulin Pen Needle 31G X 5 MM MISC USE AS DIRECTED BY PHYSICIAN. Test 4x/day  200 each  3  . loperamide (IMODIUM) 2 MG capsule Take 4 mg by mouth 2 (two) times daily.       . meclizine (ANTIVERT) 25 MG tablet Take 1 tablet (25 mg total) by mouth 3 (three) times daily as needed for dizziness or  nausea.  90 tablet  0  . metoprolol succinate (TOPROL-XL) 100 MG 24 hr tablet Take 1 tablet (100 mg total) by mouth 2 (two) times daily.  180 tablet  3  . Needle, Disp, (BD SAFETYGLIDE NEEDLE) 27G X 5/8" MISC Use to dispense insulin at bedtime Dx 250.00  90 each  3  . ONE TOUCH LANCETS MISC PATIENT USES ONE TOUCH DELICAL 35W LANCETS. USE AS DIRECTED TWICE DAILY'; DIAGNOSIS CODE 250.02      . ONETOUCH DELICA LANCETS 65K MISC PATIENT USES ONE TOUCH DELICAL 81E LANCETS. USE AS DIRECTED THREE DAILY' DIAGNOSIS CODE 250.02  300 each  2  . OVER THE COUNTER MEDICATION Insync - Probiotic take 1 by mouth daily      . potassium chloride (KLOR-CON M10) 10 MEQ tablet TAKE 2 TABLETS (20 MEQ) DAILY  180 tablet  3  . venlafaxine (EFFEXOR) 37.5 MG tablet Take 75 mg by mouth daily.      . [DISCONTINUED] amitriptyline (ELAVIL) 25 MG tablet Take 1 tablet (25 mg total) by mouth at bedtime. start with 1/2 tab at night for 1 week then increase to 1 whole tab.  30 tablet  3  . [DISCONTINUED] bromocriptine (PARLODEL) 2.5 MG tablet Take 2.5 mg by mouth 2 (two) times daily.        . [DISCONTINUED] potassium chloride (K-DUR) 10 MEQ tablet Take 2 tablets (20 mEq total) by mouth daily.  90 tablet  1   No current facility-administered medications on file prior to visit.    Allergies:  Allergies  Allergen Reactions  . Actos [Pioglitazone] Other (See Comments)    EDEMA   . Benzocaine-Menthol Swelling    SWELLING OF MOUTH  . Colesevelam Other (See Comments)    GI UPSET  . Flagyl [Metronidazole Hcl] Other (See Comments)    DIAPHORESIS  . Metformin And Related Diarrhea  . Omeprazole Swelling    SWELLING OF TONGUE AND THROAT  . Shrimp [Shellfish Allergy] Itching    OF THROAT AND EARS  . Statins Other (See Comments)    HEART RACING  . Hydromorphone Itching  . Adhesive [Tape] Other (See Comments)    SKIN IRRITATION AND BRUISING  . Nisoldipine Itching  . Percocet [Oxycodone-Acetaminophen] Itching     Review of  Systems:  CONSTITUTIONAL: No fevers, chills, night sweats, or weight loss.   EYES: No visual changes or eye pain ENT: No hearing changes.  No history of nose bleeds.   RESPIRATORY: No cough, wheezing and shortness of breath.   CARDIOVASCULAR: Negative for chest pain, and palpitations.   GI: Negative for abdominal discomfort, blood in stools or black stools.  No recent change in bowel habits.   GU:  No history of incontinence.   MUSCLOSKELETAL: +history of joint pain or swelling.  No myalgias.   SKIN: Negative for lesions, rash, and itching.   ENDOCRINE: Negative for cold or  heat intolerance, polydipsia or goiter.   PSYCH:  No depression or anxiety symptoms.   NEURO: As Above.   Vital Signs:  BP 128/80  Pulse 78  Ht 5' 1.42" (1.56 m)  Wt 205 lb 3 oz (93.072 kg)  BMI 38.24 kg/m2  SpO2 93%  Neurological Exam: MENTAL STATUS including orientation to time, place, person, recent and remote memory, attention span and concentration, language, and fund of knowledge is normal.  Speech is not dysarthric.  CRANIAL NERVES:  Pupils equal round and reactive to light.  Normal conjugate, extra-ocular eye movements in all directions of gaze.  No ptosis. Face is symmetric. Palate elevates symmetrically.  Tongue is midline.  MOTOR:  Motor strength is 5/5 in all extremities.  No pronator drift.  Tone is normal.    MSRs:  Right      Left  brachioradialis  2+   brachioradialis  2+   biceps  2+   biceps  2+   triceps  2+   triceps  2+   patellar  3+   patellar  3+   ankle jerk  1+   ankle jerk  1+   Hoffman  no   Hoffman  no   plantar response  down   plantar response  down    SENSORY: Vibration is reduced to 50% at the knees bilaterally.   COORDINATION/GAIT:   Gait narrow based and stable.   Data: EMG performed 03/17/2013 at [facility]: Shows bilateral carpal tunnel syndrome, mild to moderate in degree, and worse on the right.   MRI cervical spine wo contrast 12/05/2011:  1. Spondylosis  and broad-based central disc protrusion at C5-C6 contribute to moderate spinal stenosis with probable mild left greater than right foraminal narrowing. Foraminal assessment is mildly motion limited; consider oblique plain film correlation. No abnormal cord signal is seen.  2. Asymmetric facet hypertrophy on the left at C6-C7 contributes to mild to moderate left foraminal stenosis.  3. No other significant spinal stenosis or nerve root encroachment identified.   MRI cervical spine wo contrast 05/12/2012:  1. New focal soft disc protrusion at C5-6 into the right lateral recess compressing the right C6 nerve and slightly compressing the  adjacent spinal cord.  2. No other significant change since the prior study.  Lab Results  Component Value Date   HGBA1C 8.3* 01/31/2014   Labs 03/03/2014:  ENA neg, ESR 4, CRP <0.5, copper 79, vitamin B12 408, ceruloplasmin 23, TSH 1.40   IMPRESSION: [name] is a 68 year-old female with history of ulcerative colitis (on remicaide) and diabetes mellitus (on insulin, HbA1c 8.3) returning for evaluation glove-stocking pattern of small fiber neuropathy.  Etiology is most likely due to underlying diabetes and possibly also ulcerative colitis.  Other neuropathy labs including ESR, CRP, B12, copper, ceruloplasmin, TSH, ENA, vitamin B12, and vitamin B6 are normal.    Management is two-fold: (1) treating the underlying disease process - ulcerative colitis and diabetes and (2) pain relief with medications. She is already on Remicaide infusions and does not seem to be having any relief. I have discussed with her gastroenterologist who does not oppose a trial of IVIG since it can be used for patients with small fiber neuropathy due to UC, who did not respond to conventional treatment.  At this time, I favor trying to optimize oral medications for pain first.  She is very intolerant to medications so optimal optimal dosing cannot be achieved. She has previously tried gabapentin,  amitriptyline, and nortriptyline.  Unfortunately, she became very nauseous with venlafaxine so stopped it, even though it did alleviate some of her shooting pain.  She is getting mild relief with lidocaine, so I will continue this.  Options include cymbalta, desipramine, alpha lipoic acid, as well as others not so commonly used for neuropathic pain such as lamictal, depakote, and topamax.    PLAN/RECOMMENDATIONS:  1.  Start desipramine 12m at bedtime 2.  Prescription was refilled for lidocaine ointment to feet and hands  3.  Strongly encouraged tight glycemic 4.  Return to clinic 323-month The duration of this appointment visit was 25 minutes of face-to-face time with the patient.  Greater than 50% of this time was spent in counseling, explanation of diagnosis, planning of further management, and coordination of care.   Thank you for allowing me to participate in patient's care.  If I can answer any additional questions, I would be pleased to do so.    Sincerely,    Shannan Garfinkel K. PaPosey ProntoDO

## 2014-04-20 NOTE — Patient Instructions (Signed)
1.  Start desipramine 27m at bedtime 2.  Continue lidocaine ointment 3.  Return to clinic 312-month

## 2014-04-22 ENCOUNTER — Telehealth: Payer: Self-pay | Admitting: Internal Medicine

## 2014-04-22 NOTE — Telephone Encounter (Signed)
Rec'd from [facility] forward 2 pages to Dr. Asa Lente

## 2014-04-26 ENCOUNTER — Telehealth: Payer: Self-pay | Admitting: Neurology

## 2014-04-26 NOTE — Telephone Encounter (Signed)
Spoke with patient and she said that she did not want to try liquid.  She will keep trying to take the 100 mg but if you can think of anything else, she would like for [place] to call her.

## 2014-04-26 NOTE — Telephone Encounter (Signed)
Unfortunately, gabapentin 153m only comes in capsules so it cannot be split.  It comes as a liquid form, if she wanted to try that since she can simply just take less solution.  Donika K. PPosey Pronto DO

## 2014-04-26 NOTE — Telephone Encounter (Signed)
Can't take Desipramide.  Had reactions.    Took a Gabapentin 100 mg .  Would like a lower dose if possible.

## 2014-04-26 NOTE — Telephone Encounter (Signed)
Pt needs to talk to someone about medication please call [phone]

## 2014-04-27 NOTE — Telephone Encounter (Signed)
Noted.  Ok to continue low dose for now.  Brendy Ficek K. Posey Pronto, DO

## 2014-05-17 ENCOUNTER — Encounter (HOSPITAL_COMMUNITY)
Admission: RE | Admit: 2014-05-17 | Discharge: 2014-05-17 | Disposition: A | Discharge status: Home / Self Care | Payer: Medicare Other | Source: Ambulatory Visit | Attending: Internal Medicine | Admitting: Internal Medicine

## 2014-05-17 DIAGNOSIS — K5190 Ulcerative colitis, unspecified, without complications: Secondary | ICD-10-CM | POA: Insufficient documentation

## 2014-05-17 MED ORDER — SODIUM CHLORIDE 0.9 % IV SOLN
500.0000 mg | INTRAVENOUS | Status: DC
  Administered 2014-05-17: 500 mg via INTRAVENOUS
  Filled 2014-05-17: qty 50

## 2014-05-17 MED ORDER — ACETAMINOPHEN 325 MG PO TABS
ORAL_TABLET | ORAL | Status: AC
  Filled 2014-05-17: qty 2

## 2014-05-17 MED ORDER — SODIUM CHLORIDE 0.9 % IV SOLN
INTRAVENOUS | Status: DC
  Administered 2014-05-17: 200 mL via INTRAVENOUS

## 2014-05-17 MED ORDER — LORATADINE 10 MG PO TABS
10.0000 mg | ORAL_TABLET | Freq: Once | ORAL | Status: AC
  Administered 2014-05-17: 10 mg via ORAL
  Filled 2014-05-17: qty 1

## 2014-05-17 MED ORDER — ACETAMINOPHEN 325 MG PO TABS
650.0000 mg | ORAL_TABLET | Freq: Once | ORAL | Status: AC
  Administered 2014-05-17: 650 mg via ORAL

## 2014-05-17 NOTE — Discharge Instructions (Signed)
Infliximab injection What is this medicine? INFLIXIMAB (in [place] i mab) is used to treat Crohn's disease and ulcerative colitis. It is also used to treat ankylosing spondylitis, psoriasis, and some forms of arthritis. This medicine may be used for other purposes; ask your health care provider or pharmacist if you have questions. COMMON BRAND NAME(S): Remicade What should I tell my health care provider before I take this medicine? They need to know if you have any of these conditions: -diabetes -exposure to tuberculosis -heart failure -hepatitis or liver disease -immune system problems -infection -lung or breathing disease, like COPD -multiple sclerosis -current or past resident of [place] or [name] -seizure disorder -an unusual or allergic reaction to infliximab, mouse proteins, other medicines, foods, dyes, or preservatives -pregnant or trying to get pregnant -breast-feeding How should I use this medicine? This medicine is for injection into a vein. It is usually given by a health care professional in a hospital or clinic setting. A special MedGuide will be given to you by the pharmacist with each prescription and refill. Be sure to read this information carefully each time. Talk to your pediatrician regarding the use of this medicine in children. Special care may be needed. Overdosage: If you think you have taken too much of this medicine contact a poison control center or emergency room at once. NOTE: This medicine is only for you. Do not share this medicine with others. What if I miss a dose? It is important not to miss your dose. Call your doctor or health care professional if you are unable to keep an appointment. What may interact with this medicine? Do not take this medicine with any of the following medications: -anakinra -rilonacept This medicine may also interact with the following medications: -vaccines This list may not describe all possible interactions.  Give your health care provider a list of all the medicines, herbs, non-prescription drugs, or dietary supplements you use. Also tell them if you smoke, drink alcohol, or use illegal drugs. Some items may interact with your medicine. What should I watch for while using this medicine? Visit your doctor or health care professional for regular checks on your progress. If you get a cold or other infection while receiving this medicine, call your doctor or health care professional. Do not treat yourself. This medicine may decrease your body's ability to fight infections. Before beginning therapy, your doctor may do a test to see if you have been exposed to tuberculosis. This medicine may make the symptoms of heart failure worse in some patients. If you notice symptoms such as increased shortness of breath or swelling of the ankles or legs, contact your health care provider right away. If you are going to have surgery or dental work, tell your health care professional or dentist that you have received this medicine. If you take this medicine for plaque psoriasis, stay out of the sun. If you cannot avoid being in the sun, wear protective clothing and use sunscreen. Do not use sun lamps or tanning beds/booths. What side effects may I notice from receiving this medicine? Side effects that you should report to your doctor or health care professional as soon as possible: -allergic reactions like skin rash, itching or hives, swelling of the face, lips, or tongue -chest pain -fever or chills, usually related to the infusion -muscle or joint pain -red, scaly patches or raised bumps on the skin -signs of infection - fever or chills, cough, sore throat, pain or difficulty passing urine -swollen lymph nodes  in the neck, underarm, or groin areas -unexplained weight loss -unusual bleeding or bruising -unusually weak or tired -yellowing of the eyes or skin Side effects that usually do not require medical attention  (report to your doctor or health care professional if they continue or are bothersome): -headache -heartburn or stomach pain -nausea, vomiting This list may not describe all possible side effects. Call your doctor for medical advice about side effects. You may report side effects to FDA at 1-800-FDA-1088. Where should I keep my medicine? This drug is given in a hospital or clinic and will not be stored at home. NOTE: This sheet is a summary. It may not cover all possible information. If you have questions about this medicine, talk to your doctor, pharmacist, or health care provider.  2015, Elsevier/Gold Standard. (2008-05-04 10:26:02)

## 2014-05-18 ENCOUNTER — Ambulatory Visit (INDEPENDENT_AMBULATORY_CARE_PROVIDER_SITE_OTHER): Payer: Medicare Other | Admitting: Internal Medicine

## 2014-05-18 ENCOUNTER — Ambulatory Visit: Payer: Medicare Other | Admitting: Internal Medicine

## 2014-05-18 ENCOUNTER — Other Ambulatory Visit (INDEPENDENT_AMBULATORY_CARE_PROVIDER_SITE_OTHER): Payer: Medicare Other

## 2014-05-18 ENCOUNTER — Encounter: Payer: Self-pay | Admitting: Internal Medicine

## 2014-05-18 VITALS — BP 126/82 | HR 61 | Temp 98.1°F | Ht 61.0 in | Wt 211.8 lb

## 2014-05-18 DIAGNOSIS — IMO0001 Reserved for inherently not codable concepts without codable children: Secondary | ICD-10-CM

## 2014-05-18 DIAGNOSIS — G609 Hereditary and idiopathic neuropathy, unspecified: Secondary | ICD-10-CM

## 2014-05-18 DIAGNOSIS — G629 Polyneuropathy, unspecified: Secondary | ICD-10-CM

## 2014-05-18 DIAGNOSIS — IMO0002 Reserved for concepts with insufficient information to code with codable children: Secondary | ICD-10-CM

## 2014-05-18 DIAGNOSIS — E1165 Type 2 diabetes mellitus with hyperglycemia: Secondary | ICD-10-CM

## 2014-05-18 DIAGNOSIS — E669 Obesity, unspecified: Secondary | ICD-10-CM | POA: Diagnosis not present

## 2014-05-18 LAB — LIPID PANEL
CHOL/HDL RATIO: 5
CHOLESTEROL: 235 mg/dL — AB (ref 0–200)
HDL: 48.8 mg/dL (ref >39.00)
LDL CALC: 157 mg/dL — AB (ref 0–99)
NonHDL: 186.2
TRIGLYCERIDES: 144 mg/dL (ref 0.0–149.0)
VLDL: 28.8 mg/dL (ref 0.0–40.0)

## 2014-05-18 LAB — HEMOGLOBIN A1C: Hgb A1c MFr Bld: 7.2 % — ABNORMAL HIGH (ref 4.6–6.5)

## 2014-05-18 MED ORDER — "NEEDLE (DISP) 27G X 5/8"" MISC": Status: DC

## 2014-05-18 MED ORDER — PHENTERMINE HCL 37.5 MG PO CAPS: 37.5000 mg | ORAL_CAPSULE | ORAL | Status: DC

## 2014-05-18 MED ORDER — INSULIN PEN NEEDLE 31G X 5 MM MISC: Status: DC

## 2014-05-18 MED ORDER — ONETOUCH DELICA LANCETS 33G MISC: Status: DC

## 2014-05-18 MED ORDER — INSULIN DETEMIR 100 UNIT/ML FLEXPEN: 35.0000 [IU] | PEN_INJECTOR | Freq: Two times a day (BID) | SUBCUTANEOUS | Status: DC

## 2014-05-18 NOTE — Assessment & Plan Note (Signed)
Prev follows with endo ([facility]) for same - but would rather follow here Reviewed home cbgs - range 70-200s Added NPH 11/2013 to ongoing TID aspart - no significant change Changed NPH to Levemir 01/2014 because spouse on same - improved readings refer back to endo (outside provider) if unsuccessful managing same here Reviewed insulin resistance and poor tolerance of many oral medications in prior trials associated with neuropathy in feet - but intol of prior elavil trial - trying desipramine since 03/2014 per neuro Intermittently on gaba    Lab Results  Component Value Date   HGBA1C 8.3* 01/31/2014

## 2014-05-18 NOTE — Assessment & Plan Note (Signed)
Long hx same - hands feet On "neurontin" since 08/2013 but prior intol to gaba and amitrip and Lyrica reviewed Consult by neuro reviewed:Small fiber neuropathy - now on alt TCA trial Education reassurance provided

## 2014-05-18 NOTE — Assessment & Plan Note (Signed)
Wt Readings from Last 3 Encounters:  05/18/14 211 lb 12.8 oz (96.072 kg)  04/20/14 205 lb 3 oz (93.072 kg)  03/22/14 207 lb 3.2 oz (93.985 kg)   Will use Phentermine to help you reach weight loss goals -  we reviewed potential risk/benefit and possible side effects - pt understands and agrees to same  today prescription for 1st of 3-6 months provided - will return in 1 month (nurse visit for weight check) before refill will be given Please schedule followup in 3 months for visit with me to recheck weight and review, call sooner if problems.

## 2014-05-18 NOTE — Progress Notes (Signed)
Pre visit review using our clinic review tool, if applicable. No additional management support is needed unless otherwise documented below in the visit note. 

## 2014-05-18 NOTE — Progress Notes (Signed)
Subjective:    Patient ID: [name], female    DOB: 1946/07/22, 68 y.o.   MRN: [id number]  HPI  Patient is here for follow up  Reviewed chronic medical issues and interval medical events  Past Medical History  Diagnosis Date  . GERD   . Osteoarthritis   . HYPERTENSION   . IDDM (insulin dependent diabetes mellitus)   . Ulcerative colitis     Remicade infusion Q 8 weeks  . History of thrombocytopenia 12/2011  . 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 Dental bridge present     lower  . Anxiety   . Wears glasses     reading  . PONV (postoperative nausea and vomiting)     Review of Systems  Constitutional: Positive for fatigue and unexpected weight change.  Psychiatric/Behavioral: Positive for dysphoric mood (emotional conflict with sister since death of dad - property dispute). Negative for decreased concentration. The patient is nervous/anxious.        Objective:   Physical Exam  BP 126/82  Pulse 61  Temp(Src) 98.1 F (36.7 C) (Oral)  Ht 5' 1"  (1.549 m)  Wt 211 lb 12.8 oz (96.072 kg)  BMI 40.04 kg/m2  SpO2 98% Wt Readings from Last 3 Encounters:  05/18/14 211 lb 12.8 oz (96.072 kg)  04/20/14 205 lb 3 oz (93.072 kg)  03/22/14 207 lb 3.2 oz (93.985 kg)   Constitutional: She is obese, appears well-developed and well-nourished. No distress.  Neck: Normal range of motion. Neck supple. No JVD present. No thyromegaly present.  Cardiovascular: Normal rate, regular rhythm and normal heart sounds.  No murmur heard. No BLE edema. Pulmonary/Chest: Effort normal and breath sounds normal. No respiratory distress. She has no wheezes.  Psychiatric: She has a normal mood and affect. Her behavior is normal. Judgment and thought content normal.   Lab Results  Component Value Date   WBC 8.6 11/30/2013   HGB 14.4 11/30/2013   HCT 40.7 11/30/2013   PLT 194 11/30/2013   GLUCOSE 162* 11/30/2013   CHOL 175 01/24/2011   TRIG 538.0* 01/24/2011   HDL 40.30 01/24/2011     LDLDIRECT 69.5 01/24/2011   LDLCALC 80 01/26/2010   ALT 29 11/30/2013   AST 32 11/30/2013   NA 138 11/30/2013   K 3.9 11/30/2013   CL 100 11/30/2013   CREATININE 0.82 11/30/2013   BUN 18 11/30/2013   CO2 26 11/30/2013   TSH 1.400 03/04/2014   INR 1.25 01/14/2012   HGBA1C 8.3* 01/31/2014   MICROALBUR 0.6 12/07/2012    No results found.     Assessment & Plan:   Problem List Items Addressed This Visit   DM (diabetes mellitus), type 2, uncontrolled - Primary      Prev follows with endo ([facility]) for same - but would rather follow here Reviewed home cbgs - range 70-200s Added NPH 11/2013 to ongoing TID aspart - no significant change Changed NPH to Levemir 01/2014 because spouse on same - improved readings refer back to endo (outside provider) if unsuccessful managing same here Reviewed insulin resistance and poor tolerance of many oral medications in prior trials associated with neuropathy in feet - but intol of prior elavil trial - trying desipramine since 03/2014 per neuro Intermittently on gaba    Lab Results  Component Value Date   HGBA1C 8.3* 01/31/2014      Relevant Medications      Insulin Detemir (LEVEMIR) 100 UNIT/ML Pen   Other  Relevant Orders      Hemoglobin A1c      Lipid panel   Obese      Wt Readings from Last 3 Encounters:  05/18/14 211 lb 12.8 oz (96.072 kg)  04/20/14 205 lb 3 oz (93.072 kg)  03/22/14 207 lb 3.2 oz (93.985 kg)   Will use Phentermine to help you reach weight loss goals -  we reviewed potential risk/benefit and possible side effects - pt understands and agrees to same  today prescription for 1st of 3-6 months provided - will return in 1 month (nurse visit for weight check) before refill will be given Please schedule followup in 3 months for visit with me to recheck weight and review, call sooner if problems.    Relevant Medications      Insulin Detemir (LEVEMIR) 100 UNIT/ML Pen      phentermine capsule   Peripheral neuropathy     Long hx same - hands  feet On "neurontin" since 08/2013 but prior intol to gaba and amitrip and Lyrica reviewed Consult by neuro reviewed:Small fiber neuropathy - now on alt TCA trial Education reassurance provided    Relevant Medications      phentermine capsule

## 2014-05-18 NOTE — Patient Instructions (Signed)
It was good to see you today.  We have reviewed your prior records including labs and tests today  Test(s) ordered today. Your results will be released to [place] (or called to you) after review, usually within 72hours after test completion. If any changes need to be made, you will be notified at that same time.  Medications reviewed and updated Will use Phentermine to help you reach your weight loss goals -  today prescription for 1st of 3-6 months provided - If side effects or other problems, please stop medication and call us. Please return in 1 month (nurse visit for weight check) before refill will be given  Please schedule followup in 3 months for visit with me to recheck weight and review, call sooner if problems.

## 2014-05-19 ENCOUNTER — Telehealth: Payer: Self-pay | Admitting: Internal Medicine

## 2014-05-19 NOTE — Telephone Encounter (Signed)
Pt [facility] (need new Rx send in): levemir, novolog flexpen, one touch ultra test strips, one touch delica lancet, nano penn needles 4 millimeter x 32 gauge and BD insulin syringes that will draw 100 united. Please advise, pt stated she left vm.

## 2014-05-20 ENCOUNTER — Other Ambulatory Visit: Payer: Self-pay

## 2014-05-20 MED ORDER — ONETOUCH DELICA LANCETS 33G MISC: Status: DC

## 2014-05-20 MED ORDER — INSULIN DETEMIR 100 UNIT/ML FLEXPEN: 70.0000 [IU] | PEN_INJECTOR | Freq: Two times a day (BID) | SUBCUTANEOUS | Status: DC

## 2014-05-20 MED ORDER — GLUCOSE BLOOD VI STRP: ORAL_STRIP | Status: DC

## 2014-05-20 MED ORDER — "INSULIN SYRINGE-NEEDLE U-100 31G X 5/16"" 1 ML MISC": Status: DC

## 2014-05-20 MED ORDER — PHENTERMINE HCL 37.5 MG PO CAPS: 37.5000 mg | ORAL_CAPSULE | ORAL | Status: DC

## 2014-05-20 MED ORDER — INSULIN ASPART 100 UNIT/ML FLEXPEN: PEN_INJECTOR | SUBCUTANEOUS | Status: DC

## 2014-05-20 MED ORDER — INSULIN PEN NEEDLE 31G X 5 MM MISC: Status: DC

## 2014-05-23 ENCOUNTER — Other Ambulatory Visit: Payer: Self-pay

## 2014-05-23 NOTE — Telephone Encounter (Signed)
Sent a new rx for different pen needle per pt request.   32g is denied.

## 2014-05-25 ENCOUNTER — Other Ambulatory Visit: Payer: Self-pay

## 2014-05-25 MED ORDER — INSULIN PEN NEEDLE 31G X 5 MM MISC: Status: DC

## 2014-05-27 ENCOUNTER — Ambulatory Visit: Payer: Medicare Other | Admitting: Obstetrics & Gynecology

## 2014-05-31 ENCOUNTER — Encounter: Payer: Self-pay | Admitting: Obstetrics & Gynecology

## 2014-05-31 ENCOUNTER — Telehealth: Payer: Self-pay | Admitting: *Deleted

## 2014-05-31 ENCOUNTER — Ambulatory Visit (INDEPENDENT_AMBULATORY_CARE_PROVIDER_SITE_OTHER): Payer: Medicare Other | Admitting: Obstetrics & Gynecology

## 2014-05-31 VITALS — BP 136/80 | HR 72 | Ht 61.0 in | Wt 213.0 lb

## 2014-05-31 DIAGNOSIS — M6208 Separation of muscle (nontraumatic), other site: Secondary | ICD-10-CM

## 2014-05-31 DIAGNOSIS — R1031 Right lower quadrant pain: Secondary | ICD-10-CM

## 2014-05-31 DIAGNOSIS — Z01419 Encounter for gynecological examination (general) (routine) without abnormal findings: Secondary | ICD-10-CM

## 2014-05-31 DIAGNOSIS — R609 Edema, unspecified: Secondary | ICD-10-CM

## 2014-05-31 DIAGNOSIS — E1065 Type 1 diabetes mellitus with hyperglycemia: Secondary | ICD-10-CM

## 2014-05-31 DIAGNOSIS — E109 Type 1 diabetes mellitus without complications: Secondary | ICD-10-CM | POA: Diagnosis not present

## 2014-05-31 DIAGNOSIS — R600 Localized edema: Secondary | ICD-10-CM

## 2014-05-31 DIAGNOSIS — M6200 Separation of muscle (nontraumatic), unspecified site: Secondary | ICD-10-CM

## 2014-05-31 DIAGNOSIS — Z124 Encounter for screening for malignant neoplasm of cervix: Secondary | ICD-10-CM | POA: Diagnosis not present

## 2014-05-31 MED ORDER — HYDROCORTISONE 2.5 % RE CREA: 1.0000 "application " | TOPICAL_CREAM | Freq: Two times a day (BID) | RECTAL | Status: DC

## 2014-05-31 NOTE — Progress Notes (Signed)
Patient is scheduled for CT/Abdomen and Pelvis with contrast for 06/07/14 (patient choice of date) at [facility] at [place], [place] [address]. Lab work pending-hx of DM/HBP. Patient given written appointment information with instructions and advised no solid food four hours prior to imaging.  Patient advised to pick up oral contrast prior to imaging-instructions given.  Patient denies any allergies to oral/IV contrast.   Patient reports intermittent RLQ that she describes as occurring over the last few weeks. Describes pain as sharp/stabbing.

## 2014-05-31 NOTE — Progress Notes (Signed)
68 y.o. G1P1 Married Caucasian Fe here for annual exam.  No vaginal bleeding.  Seeing Dr. 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  She has undergone extensive blood work that has not really shown anything.  Has also seen ortho for carpel tunnel testing.  This showed she had carpel tunnel syndrome.  She has had both hands done but this didn't really make much different.  Pt with hx of diabetes so neuropathy due to this quite possible.  Has tried amitriptyline, trazadone, effexor, cymbalta, and gabapentin.  Intolerant of all of these.  Pt reports over the last several weeks she is having RLQ pain.  She notices it more when she is bending over.  Denies bowel or bladder changes.  H/O TAH then later BSO with LOA involving gyn oncology.   Pt saw Dr. Laural [name] in March regarding U/C.  Has developed lymphocytosis.  Has been followed by him for years.  Flow cytometry done which was negative.  Also saw Dr. Dineen Kid regarding lymphocytosis.  CT was recommended but pt did not due it as she subsequently saw Dr. Laural [name].  She did not really like this encounter very much.  Reports she is having trouble with swelling in RLL.  Reports "testing" has been recommended but she hasn't done this.  Reviewed every note in EPIC by every provider she has seen over the last year.    Pt reports being dissastisfied by the way her abdomen looks.  Wants to have something done about diastasis.  Needs to lose weight and have very stable and low HbA1Cs well before contemplating any surgery.  No vaginal bleeding.     No LMP recorded. Patient has had a hysterectomy.          Sexually active: Yes.    The current method of family planning is status post hysterectomy.    Exercising: Yes.    Home exercise routine includes walking the stairs at home. Smoker:  no  Health Maintenance: Pap:  05/2009 MMG:  03/16/14 Bi-Rads probably benign Colonoscopy:  2009, wnl BMD:   05/2009, -1.1 TDaP:  06/15/2009, -1.1 Labs: PCP       UA: PCP   reports that she has never smoked. She has never used smokeless tobacco. She reports that she does not drink alcohol or use illicit drugs.  Past Medical History  Diagnosis Date  . GERD   . Osteoarthritis   . HYPERTENSION   . IDDM (insulin dependent diabetes mellitus)   . Ulcerative colitis     Remicade infusion Q 8 weeks  . History of thrombocytopenia 12/2011  . 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 Dental bridge present     lower  . Anxiety     Past Surgical History  Procedure Laterality Date  . Abdominal hysterectomy  1980    partial  . Breast reduction surgery  1994  . Rectocele repair  1990; 09/12/2006  . Carpal tunnel release Right 1996  . Knee arthroscopy Right 01/1999; 10/2000  . Hemilaminotomy lumbar spine Bilateral 09/07/1999    L4-5  . Cholecystectomy    . Flexible sigmoidoscopy  01/17/2012    Procedure: FLEXIBLE SIGMOIDOSCOPY;  Surgeon: Rogene Houston, MD;  [facility];  Service: Endoscopy;  Laterality: N/A;  . Bilateral salpingoophorectomy  02/10/2001  . Tarsal tunnel release  2002  . Ureterolysis Right 02/10/2001  . Lysis of adhesion  02/10/2001  . Carpal tunnel release Left 03/21/2003  . Tumor excision  Left 03/21/2003    dorsal 1st web space (hand)  . Esophagogastroduodenoscopy (egd) with esophageal dilation  12/02/2005  . Carpal tunnel release Right 05/04/2013    Procedure: CARPAL TUNNEL RELEASE;  Surgeon: Cammie Sickle., MD;  Location: [facility];  Service: Orthopedics;  Laterality: Right;  . Carpal tunnel release Left 09/21/2013    Procedure: LEFT CARPAL TUNNEL RELEASE;  Surgeon: Cammie Sickle., MD;  Location: [facility];  Service: Orthopedics;  Laterality: Left;    Current Outpatient Prescriptions  Medication Sig Dispense Refill  . azelastine (ASTELIN) 137 MCG/SPRAY nasal spray Place 2 sprays into both nostrils 2 (two) times daily. Use in each nostril as directed  30 mL  2  .  esomeprazole (NEXIUM) 40 MG capsule Take 1 capsule (40 mg total) by mouth 2 (two) times daily.  180 capsule  3  . furosemide (LASIX) 40 MG tablet TAKE 1 TABLET DAILY  90 tablet  3  . glucose blood test strip Test 3 times daily as directed  400 each  2  . inFLIXimab (REMICADE) 100 MG injection Inject into the vein every 8 (eight) weeks.       . insulin aspart (NOVOLOG) 100 UNIT/ML FlexPen 3 times a day (just before each meal) 130-130-130 units, and pen needles 3/day.  40 pen  2  . Insulin Detemir (LEVEMIR) 100 UNIT/ML Pen Inject 70 Units into the skin 2 (two) times daily.  130 mL  3  . Insulin Pen Needle 31G X 5 MM MISC USE AS DIRECTED BY PHYSICIAN. Test 4x/day  600 each  3  . Insulin Syringe-Needle U-100 31G X 5/16" 1 ML MISC Use as instructed by MD to inject insulin up to 3 times a weeks.  100 each  3  . lidocaine (XYLOCAINE) 5 % ointment Apply to hands and feet twice daily.  50 g  3  . loperamide (IMODIUM) 2 MG capsule Take 4 mg by mouth 2 (two) times daily.       . meclizine (ANTIVERT) 25 MG tablet Take 1 tablet (25 mg total) by mouth 3 (three) times daily as needed for dizziness or nausea.  90 tablet  0  . metoprolol succinate (TOPROL-XL) 100 MG 24 hr tablet Take 1 tablet (100 mg total) by mouth 2 (two) times daily.  180 tablet  3  . Needle, Disp, (BD SAFETYGLIDE NEEDLE) 27G X 5/8" MISC Use to dispense insulin at bedtime Dx 250.00  90 each  3  . ONETOUCH DELICA LANCETS 74J MISC PATIENT USES ONE TOUCH DELICAL 28N LANCETS. USE AS DIRECTED THREE DAILY' DIAGNOSIS CODE 250.02  300 each  2  . OVER THE COUNTER MEDICATION Insync - Probiotic take 1 by mouth daily      . potassium chloride (KLOR-CON M10) 10 MEQ tablet TAKE 2 TABLETS (20 MEQ) DAILY  180 tablet  3  . desipramine (NOPRAMIN) 10 MG tablet Take 1 tablet (10 mg total) by mouth at bedtime.  30 tablet  3  . phentermine 37.5 MG capsule Take 1 capsule (37.5 mg total) by mouth every morning.  30 capsule  0  . [DISCONTINUED] amitriptyline (ELAVIL) 25  MG tablet Take 1 tablet (25 mg total) by mouth at bedtime. start with 1/2 tab at night for 1 week then increase to 1 whole tab.  30 tablet  3  . [DISCONTINUED] bromocriptine (PARLODEL) 2.5 MG tablet Take 2.5 mg by mouth 2 (two) times daily.        . [DISCONTINUED]  potassium chloride (K-DUR) 10 MEQ tablet Take 2 tablets (20 mEq total) by mouth daily.  90 tablet  1   No current facility-administered medications for this visit.    Family History  Problem Relation Age of Onset  . Diabetes Mother   . Hypertension Mother   . Heart attack Father     Mid 41's  . Heart disease Father   . Lung disease Father     spot on lung; had lung surgery  . Alcohol abuse Other   . Hypertension Son   . Diabetes Son     ROS:  Pertinent items are noted in HPI.  Otherwise, a comprehensive ROS was negative.  Exam:   BP 136/80  Pulse 72  Ht 5' 1"  (1.549 m)  Wt 213 lb (96.616 kg)  BMI 40.27 kg/m2 Height: 5' 1"  (154.9 cm)  Ht Readings from Last 3 Encounters:  05/31/14 5' 1"  (1.549 m)  05/18/14 5' 1"  (1.549 m)  04/20/14 5' 1.42" (1.56 m)    General appearance: alert, cooperative and appears stated age Head: Normocephalic, without obvious abnormality, atraumatic Neck: no adenopathy, supple, symmetrical, trachea midline and thyroid normal to inspection and palpation Lungs: clear to auscultation bilaterally Breasts: normal appearance, no masses or tenderness Heart: regular rate and rhythm Abdomen: soft, non-tender; no masses,  no organomegaly Extremities: extremities normal, atraumatic, no cyanosis or edema Skin: Skin color, texture, turgor normal. No rashes or lesions Lymph nodes: Cervical, supraclavicular, and axillary nodes normal. No abnormal inguinal nodes palpated Neurologic: Grossly normal   Pelvic: External genitalia:  no lesions              Urethra:  normal appearing urethra with no masses, tenderness or lesions              Bartholin's and Skene's: normal                 Vagina: normal  appearing vagina with normal color and discharge, no lesions              Cervix: absent              Pap taken: No. Bimanual Exam:  Uterus:  uterus absent              Adnexa: no mass, fullness, tenderness               Rectovaginal: Confirms               Anus:  normal sphincter tone, no lesions  A:  Well Woman with normal exam Hypertension Neuropathy Dyslipidemia GERD U.C.  Followed by Dr. Laural [name] and on Remicade Leukocytosis Diabetes--on insulin.  Followed by Dr. Loanne Drilling.  Hb A1C 7.4 9/14 and 9.3 10/20/13 Anxiety LLE edema.  Recommended vascular evaluation.  Pt will consider.  P:   Mammogram yearly recommended.  H/O benign calcifications.  Knows will have diagnostic again in one year to document two years of stability. Plan CT abd/pelvis with contrast. BMP ordered.  May need to check HBA1C as well.  Lat 01/31/14 was 8.3, decreased from 9.3.   return annually or prn   Lengthy visit with pt discussing her frustrations with weight, appearance of abdomen, diabetes, neuropathy, abdominal pain in addition to routine gynecologist care.  Visit was 50 minutes with >50% of this time in face to face discussion regarding other documented issues.

## 2014-05-31 NOTE — Telephone Encounter (Signed)
Patient states she is having a reaction to one of her medication please call [phone]

## 2014-06-01 LAB — BASIC METABOLIC PANEL
BUN: 13 mg/dL (ref 6–23)
CO2: 31 mEq/L (ref 19–32)
CREATININE: 0.81 mg/dL (ref 0.50–1.10)
Calcium: 9.3 mg/dL (ref 8.4–10.5)
Chloride: 99 mEq/L (ref 96–112)
GLUCOSE: 185 mg/dL — AB (ref 70–99)
Potassium: 3.8 mEq/L (ref 3.5–5.3)
Sodium: 137 mEq/L (ref 135–145)

## 2014-06-01 NOTE — Telephone Encounter (Signed)
Patient calling again please return call

## 2014-06-02 ENCOUNTER — Telehealth: Payer: Self-pay | Admitting: Emergency Medicine

## 2014-06-02 NOTE — Telephone Encounter (Signed)
Spoke with Jenny Reichmann at [facility]  Required Nurse Review. Then required peer to peer review.   Dr. Sabra Heck can you call for peer to peer?   [facility] [phone], press 1 for diagnostic imaging and then press 2 for peer to peer review.  Case ID is [id number]

## 2014-06-03 ENCOUNTER — Other Ambulatory Visit (HOSPITAL_COMMUNITY): Payer: Self-pay

## 2014-06-03 NOTE — Telephone Encounter (Signed)
Daila from [facility] calling to let sabrina know that the prior authorization for the CT for abdomin and pelvis has be authorized. Daid that the [place] # was [id number] and it was authorized for 06/02/14 to 07/01/14.

## 2014-06-07 ENCOUNTER — Ambulatory Visit
Admission: RE | Admit: 2014-06-07 | Discharge: 2014-06-07 | Disposition: A | Discharge status: Home / Self Care | Payer: Medicare Other | Source: Ambulatory Visit | Attending: Obstetrics & Gynecology | Admitting: Obstetrics & Gynecology

## 2014-06-07 DIAGNOSIS — I708 Atherosclerosis of other arteries: Secondary | ICD-10-CM | POA: Diagnosis not present

## 2014-06-07 DIAGNOSIS — R1031 Right lower quadrant pain: Secondary | ICD-10-CM

## 2014-06-07 DIAGNOSIS — K7689 Other specified diseases of liver: Secondary | ICD-10-CM | POA: Diagnosis not present

## 2014-06-07 MED ORDER — IOHEXOL 300 MG/ML  SOLN
125.0000 mL | Freq: Once | INTRAMUSCULAR | Status: AC | PRN
  Administered 2014-06-07: 125 mL via INTRAVENOUS

## 2014-06-08 ENCOUNTER — Encounter: Payer: Self-pay | Admitting: *Deleted

## 2014-06-08 NOTE — Telephone Encounter (Signed)
Patient given message from Dr. Sabra Heck.  She states pain "just comes and goes." Has not worsened.  Patient agreeable to sending copy of CT to Dr. Corbin Ade.  Will follow up as needed.

## 2014-06-08 NOTE — Telephone Encounter (Signed)
Pt needs to talk to someone today about medication please call [phone]

## 2014-06-08 NOTE — Telephone Encounter (Signed)
Message copied by Michele Mcalpine on Wed Jun 08, 2014  2:02 PM ------      Message from: Megan Salon      Created: Wed Jun 08, 2014  7:28 AM       Please inform pt CT was essentially negative.  It did show that she has a fatty liver which.  Usually, nothing needs to be done about this.  I do want to sent her gastroenterologist a copy of this if that is okay with her.  Her last CT was done after she had an episode of colitis.  All those changes have resolved and the bowel appears normal.  Ovaries and other organs in the abdomen and pelvis appear normal.  No cause of the pain seen on CT in addition there is no evidence of hernia. ------

## 2014-06-08 NOTE — Telephone Encounter (Signed)
Dr. Serita Grit notes reviewed, patient tried nortriptyline in the past. She is requesting to again use it, ok to Rx Nortriptyline 70m qhs, dispense #30, with 1 refill. Pls forward to Dr. PPosey Prontoafter letting pt know. Thanks

## 2014-06-08 NOTE — Telephone Encounter (Signed)
Patient called stating that her hands and feet are on fire and she can't sleep at night.  She can't take gabapentin.  Can she try Nortriptyline 10 mg qhs?

## 2014-06-08 NOTE — Telephone Encounter (Signed)
I spoke with patient and let her know what we were calling in for her.  Rx called in to [facility] per Dr. Delice Lesch.

## 2014-06-09 ENCOUNTER — Encounter (HOSPITAL_BASED_OUTPATIENT_CLINIC_OR_DEPARTMENT_OTHER): Payer: Medicare Other

## 2014-06-09 ENCOUNTER — Encounter (HOSPITAL_COMMUNITY): Payer: Self-pay | Admitting: Lab

## 2014-06-09 ENCOUNTER — Other Ambulatory Visit (HOSPITAL_COMMUNITY): Payer: Self-pay | Admitting: Hematology and Oncology

## 2014-06-09 ENCOUNTER — Ambulatory Visit (HOSPITAL_COMMUNITY): Payer: Medicare Other

## 2014-06-09 ENCOUNTER — Encounter (HOSPITAL_COMMUNITY): Payer: Medicare Other | Attending: Internal Medicine

## 2014-06-09 ENCOUNTER — Other Ambulatory Visit (HOSPITAL_COMMUNITY): Payer: Self-pay

## 2014-06-09 ENCOUNTER — Other Ambulatory Visit (HOSPITAL_COMMUNITY): Payer: Medicare Other

## 2014-06-09 ENCOUNTER — Encounter (HOSPITAL_COMMUNITY): Payer: Self-pay

## 2014-06-09 VITALS — BP 155/69 | HR 70 | Temp 98.0°F | Resp 18 | Wt 214.8 lb

## 2014-06-09 DIAGNOSIS — D696 Thrombocytopenia, unspecified: Secondary | ICD-10-CM | POA: Diagnosis not present

## 2014-06-09 DIAGNOSIS — D72820 Lymphocytosis (symptomatic): Secondary | ICD-10-CM | POA: Diagnosis not present

## 2014-06-09 DIAGNOSIS — K7689 Other specified diseases of liver: Secondary | ICD-10-CM

## 2014-06-09 DIAGNOSIS — C44711 Basal cell carcinoma of skin of unspecified lower limb, including hip: Secondary | ICD-10-CM | POA: Diagnosis not present

## 2014-06-09 DIAGNOSIS — K5190 Ulcerative colitis, unspecified, without complications: Secondary | ICD-10-CM | POA: Diagnosis not present

## 2014-06-09 DIAGNOSIS — J392 Other diseases of pharynx: Secondary | ICD-10-CM

## 2014-06-09 DIAGNOSIS — R041 Hemorrhage from throat: Secondary | ICD-10-CM

## 2014-06-09 DIAGNOSIS — K760 Fatty (change of) liver, not elsewhere classified: Secondary | ICD-10-CM | POA: Insufficient documentation

## 2014-06-09 DIAGNOSIS — C44719 Basal cell carcinoma of skin of left lower limb, including hip: Secondary | ICD-10-CM

## 2014-06-09 DIAGNOSIS — D7289 Other specified disorders of white blood cells: Secondary | ICD-10-CM | POA: Diagnosis not present

## 2014-06-09 LAB — COMPREHENSIVE METABOLIC PANEL
ALBUMIN: 3.3 g/dL — AB (ref 3.5–5.2)
ALK PHOS: 66 U/L (ref 39–117)
ALT: 21 U/L (ref 0–35)
AST: 24 U/L (ref 0–37)
Anion gap: 11 (ref 5–15)
BUN: 12 mg/dL (ref 6–23)
CALCIUM: 9.2 mg/dL (ref 8.4–10.5)
CO2: 27 mEq/L (ref 19–32)
Chloride: 102 mEq/L (ref 96–112)
Creatinine, Ser: 0.75 mg/dL (ref 0.50–1.10)
GFR calc non Af Amer: 86 mL/min — ABNORMAL LOW (ref >90)
GLUCOSE: 172 mg/dL — AB (ref 70–99)
POTASSIUM: 4.2 meq/L (ref 3.7–5.3)
SODIUM: 140 meq/L (ref 137–147)
TOTAL PROTEIN: 6.8 g/dL (ref 6.0–8.3)
Total Bilirubin: 0.4 mg/dL (ref 0.3–1.2)

## 2014-06-09 LAB — CBC WITH DIFFERENTIAL/PLATELET
BASOS PCT: 1 % (ref 0–1)
Basophils Absolute: 0.1 10*3/uL (ref 0.0–0.1)
Eosinophils Absolute: 0.2 10*3/uL (ref 0.0–0.7)
Eosinophils Relative: 3 % (ref 0–5)
HCT: 37.6 % (ref 36.0–46.0)
Hemoglobin: 13.2 g/dL (ref 12.0–15.0)
LYMPHS ABS: 3.8 10*3/uL (ref 0.7–4.0)
Lymphocytes Relative: 66 % — ABNORMAL HIGH (ref 12–46)
MCH: 32.4 pg (ref 26.0–34.0)
MCHC: 35.1 g/dL (ref 30.0–36.0)
MCV: 92.2 fL (ref 78.0–100.0)
Monocytes Absolute: 0.4 10*3/uL (ref 0.1–1.0)
Monocytes Relative: 7 % (ref 3–12)
NEUTROS PCT: 23 % — AB (ref 43–77)
Neutro Abs: 1.3 10*3/uL — ABNORMAL LOW (ref 1.7–7.7)
Platelets: 157 10*3/uL (ref 150–400)
RBC: 4.08 MIL/uL (ref 3.87–5.11)
RDW: 13.4 % (ref 11.5–15.5)
WBC: 5.7 10*3/uL (ref 4.0–10.5)

## 2014-06-09 LAB — LACTATE DEHYDROGENASE: LDH: 239 U/L (ref 94–250)

## 2014-06-09 NOTE — Progress Notes (Signed)
Referral made to Dr Melida Quitter ENT.

## 2014-06-09 NOTE — Patient Instructions (Signed)
[facility] Discharge Instructions  RECOMMENDATIONS MADE BY THE CONSULTANT AND ANY TEST RESULTS WILL BE SENT TO YOUR REFERRING PHYSICIAN.  EXAM FINDINGS BY THE PHYSICIAN TODAY AND SIGNS OR SYMPTOMS TO REPORT TO CLINIC OR PRIMARY PHYSICIAN: Exam and findings as discussed by Dr. Barnet Glasgow.  We are seeing you because you are on a medication that has the potential to cause cancer.  We are seeing you and  monitoring specific labs to watch out for this.  MD recommends that you see and Ear, Nose and Throat specialist about the blood that you are having in your mouth when you wake up. Report fevers, chills, night sweats, unexplained weight loss, etc.  MEDICATIONS PRESCRIBED:  none  INSTRUCTIONS/FOLLOW-UP: Follow-up in 6 months with labs and office visit.  Thank you for choosing [facility] to provide your oncology and hematology care.  To afford each patient quality time with our providers, please arrive at least 15 minutes before your scheduled appointment time.  With your help, our goal is to use those 15 minutes to complete the necessary work-up to ensure our physicians have the information they need to help with your evaluation and healthcare recommendations.    Effective January 1st, 2014, we ask that you re-schedule your appointment with our physicians should you arrive 10 or more minutes late for your appointment.  We strive to give you quality time with our providers, and arriving late affects you and other patients whose appointments are after yours.    Again, thank you for choosing [facility].  Our hope is that these requests will decrease the amount of time that you wait before being seen by our physicians.       _____________________________________________________________  Should you have questions after your visit to [facility], please contact our office at [phone] between the hours of 8:30 a.m. and 4:30 p.m.  Voicemails  left after 4:30 p.m. will not be returned until the following business day.  For prescription refill requests, have your pharmacy contact our office with your prescription refill request.    _______________________________________________________________  We hope that we have given you very good care.  You may receive a patient satisfaction survey in the mail, please complete it and return it as soon as possible.  We value your feedback!  _______________________________________________________________  Have you asked about our STAR program?  STAR stands for Survivorship Training and Rehabilitation, and this is a nationally recognized cancer care program that focuses on survivorship and rehabilitation.  Cancer and cancer treatments may cause problems, such as, pain, making you feel tired and keeping you from doing the things that you need or want to do. Cancer rehabilitation can help. Our goal is to reduce these troubling effects and help you have the best quality of life possible.  You may receive a survey from a nurse that asks questions about your current state of health.  Based on the survey results, all eligible patients will be referred to the [facility] program for an evaluation so we can better serve you!  A frequently asked questions sheet is available upon request. Peripheral Neuropathy Peripheral neuropathy is a type of nerve damage. It affects nerves that carry signals between the spinal cord and other parts of the body. These are called peripheral nerves. With peripheral neuropathy, one nerve or a group of nerves may be damaged.  CAUSES  Many things can damage peripheral nerves. For some people with peripheral neuropathy, the cause is unknown.  Some causes include:  Diabetes. This is the most common cause of peripheral neuropathy.  Injury to a nerve.  Pressure or stress on a nerve that lasts a long time.  Too little vitamin B. Alcoholism can lead to this.  Infections.  Autoimmune  diseases, such as multiple sclerosis and systemic lupus erythematosus.  Inherited nerve diseases.  Some medicines, such as cancer drugs.  Toxic substances, such as lead and mercury.  Too little blood flowing to the legs.  Kidney disease.  Thyroid disease. SIGNS AND SYMPTOMS  Different people have different symptoms. The symptoms you have will depend on which of your nerves is damaged. Common symptoms include:  Loss of feeling (numbness) in the feet and hands.  Tingling in the feet and hands.  Pain that burns.  Very sensitive skin.  Weakness.  Not being able to move a part of the body (paralysis).  Muscle twitching.  Clumsiness or poor coordination.  Loss of balance.  Not being able to control your bladder.  Feeling dizzy.  Sexual problems. DIAGNOSIS  Peripheral neuropathy is a symptom, not a disease. Finding the cause of peripheral neuropathy can be hard. To figure that out, your health care provider will take a medical history and do a physical exam. A neurological exam will also be done. This involves checking things affected by your brain, spinal cord, and nerves (nervous system). For example, your health care provider will check your reflexes, how you move, and what you can feel.  Other types of tests may also be ordered, such as:  Blood tests.  A test of the fluid in your spinal cord.  Imaging tests, such as CT scans or an MRI.  Electromyography (EMG). This test checks the nerves that control muscles.  Nerve conduction velocity tests. These tests check how fast messages pass through your nerves.  Nerve biopsy. A small piece of nerve is removed. It is then checked under a microscope. TREATMENT   Medicine is often used to treat peripheral neuropathy. Medicines may include:  Pain-relieving medicines. Prescription or over-the-counter medicine may be suggested.  Antiseizure medicine. This may be used for pain.  Antidepressants. These also may help ease  pain from neuropathy.  Lidocaine. This is a numbing medicine. You might wear a patch or be given a shot.  Mexiletine. This medicine is typically used to help control irregular heart rhythms.  Surgery. Surgery may be needed to relieve pressure on a nerve or to destroy a nerve that is causing pain.  Physical therapy to help movement.  Assistive devices to help movement. HOME CARE INSTRUCTIONS   Only take over-the-counter or prescription medicines as directed by your health care provider. Follow the instructions carefully for any given medicines. Do not take any other medicines without first getting approval from your health care provider.  If you have diabetes, work closely with your health care provider to keep your blood sugar under control.  If you have numbness in your feet:  Check every day for signs of injury or infection. Watch for redness, warmth, and swelling.  Wear padded socks and comfortable shoes. These help protect your feet.  Do not do things that put pressure on your damaged nerve.  Do not smoke. Smoking keeps blood from getting to damaged nerves.  Avoid or limit alcohol. Too much alcohol can cause a lack of B vitamins. These vitamins are needed for healthy nerves.  Develop a good support system. Coping with peripheral neuropathy can be stressful. Talk to a mental health specialist or  join a support group if you are struggling.  Follow up with your health care provider as directed. SEEK MEDICAL CARE IF:   You have new signs or symptoms of peripheral neuropathy.  You are struggling emotionally from dealing with peripheral neuropathy.  You have a fever. SEEK IMMEDIATE MEDICAL CARE IF:   You have an injury or infection that is not healing.  You feel very dizzy or begin vomiting.  You have chest pain.  You have trouble breathing. Document Released: 09/06/2002 Document Revised: 05/29/2011 Document Reviewed: 05/24/2013 [facility] Patient Information 2015  [place], [place]. This information is not intended to replace advice given to you by your health care provider. Make sure you discuss any questions you have with your health care provider.

## 2014-06-09 NOTE — Progress Notes (Signed)
[place]  OFFICE PROGRESS NOTE  Jennifer Grant, MD [place]. [facility] [address] [place] [address]  DIAGNOSIS: Lymphocytosis - Plan: Flow Cytometry  Ulcerative colitis, unspecified  Thrombocytopenia  Fatty liver  Basal cell carcinoma of lower extremity, left  Chief Complaint  Patient presents with  . Lymphocytosis  . Ulcerative Colitis    CURRENT THERAPY: Watchful expectation for lymphocytosis. Remicade maintenance every 2 months for ulcerative colitis.  INTERVAL HISTORY: [name] 68 y.o. 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 She also has documented lymphocytosis with mild thrombocytopenia with negative flow cytometry. Patient's primary complaint is peripheral neuropathy involving hands and feet. She was seen recently by a neurologist who started her on Elavil. She also wakens in the morning on occasion with a membrane in her posterior pharynx and this morning woke up with a lot of blood in her pharynx. She denies any fever, night sweats, abdominal pain, but did have a basal cell carcinoma removed from the left anterior thigh within the last few months. Appetite has been good with no nausea, vomiting, but still with intermittent diarrhea which occurs with dietary indiscretion. As a child she fractured her nasal bone and has been seen by otolaryngology as an adult.    MEDICAL HISTORY: Past Medical History  Diagnosis Date  . GERD   . Osteoarthritis   . HYPERTENSION   . IDDM (insulin dependent diabetes mellitus)   . Ulcerative colitis     Remicade infusion Q 8 weeks  . History of thrombocytopenia 12/2011  . 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 Dental bridge present     lower  . Anxiety     INTERIM HISTORY: has DM (diabetes mellitus), type 2, uncontrolled; HYPERTENSION; GERD; ULCERATIVE  COLITIS; MENOPAUSAL DISORDER; SHOULDER PAIN, RIGHT; PALPITATIONS, RECURRENT; CHEST PAIN; Other postprocedural status; FATIGUE; Dyslipidemia; Numbness in feet; Anxiety; Peripheral neuropathy; Insomnia; Thrombocytopenia; Elevated LFTs; Myalgia; Polyarthralgia; Lymphocytosis; Type I (juvenile type) diabetes mellitus with neurological manifestations, not stated as uncontrolled(250.61); Allergic sinusitis; Obese; and Fatty liver on her problem list.    ALLERGIES:  is allergic to actos; benzocaine-menthol; colesevelam; flagyl; metformin and related; omeprazole; shrimp; statins; desipramine hcl; hydromorphone; adhesive; nisoldipine; and percocet.  MEDICATIONS: has a current medication list which includes the following prescription(s): azelastine, esomeprazole, furosemide, glucose blood, hydrocortisone, infliximab, insulin aspart, insulin detemir, insulin syringe-needle u-100, lidocaine, loperamide, meclizine, metoprolol succinate, needle (disp), nortriptyline, onetouch delica lancets 44Y, OVER THE COUNTER MEDICATION, phentermine, potassium chloride, and insulin pen needle.  SURGICAL HISTORY:  Past Surgical History  Procedure Laterality Date  . Abdominal hysterectomy  1980    partial  . Breast reduction surgery  1994  . Rectocele repair  1990; 09/12/2006  . Carpal tunnel release Right 1996  . Knee arthroscopy Right 01/1999; 10/2000  . Hemilaminotomy lumbar spine Bilateral 09/07/1999    L4-5  . Cholecystectomy    . Flexible sigmoidoscopy  01/17/2012    Procedure: FLEXIBLE SIGMOIDOSCOPY;  Surgeon: Rogene Houston, MD;  Location[facility];  Service: Endoscopy;  Laterality: N/A;  . Bilateral salpingoophorectomy  02/10/2001  . Tarsal tunnel release  2002  . Ureterolysis Right 02/10/2001  . Lysis of adhesion  02/10/2001  . Carpal tunnel release Left 03/21/2003  . 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 Esophagogastroduodenoscopy (egd)  with esophageal dilation  12/02/2005  . Carpal  tunnel release Right 05/04/2013    Procedure: CARPAL TUNNEL RELEASE;  Surgeon: Cammie Sickle., MD;  Location: [place];  Service: Orthopedics;  Laterality: Right;  . Carpal tunnel release Left 09/21/2013    Procedure: LEFT CARPAL TUNNEL RELEASE;  Surgeon: Cammie Sickle., MD;  Location: [place];  Service: Orthopedics;  Laterality: Left;    FAMILY HISTORY: family history includes Alcohol abuse in her other; Diabetes in her mother and son; Heart attack in her father; Heart disease in her father; Hypertension in her mother and son; Lung disease in her father.  SOCIAL HISTORY:  reports that she has never smoked. She has never used smokeless tobacco. She reports that she does not drink alcohol or use illicit drugs.  REVIEW OF SYSTEMS:  Other than that discussed above is noncontributory.  PHYSICAL EXAMINATION: ECOG PERFORMANCE STATUS: 1 - Symptomatic but completely ambulatory  Blood pressure 155/69, pulse 70, temperature 98 F (36.7 C), temperature source Other (Comment), resp. rate 18, weight 214 lb 12.8 oz (97.433 kg), SpO2 98.00%.  GENERAL:alert, no distress and comfortable. Moderately obese. SKIN: skin color, texture, turgor are normal, no rashes or significant lesions EYES: PERLA; Conjunctiva are pink and non-injected, sclera clear SINUSES: No redness or tenderness over maxillary or ethmoid sinuses OROPHARYNX:no exudate, no erythema on lips, buccal mucosa, or tongue. No evidence of bleeding. NECK: supple, thyroid normal size, non-tender, without nodularity. No masses CHEST: Normal AP diameter with no breast masses. LYMPH:  no palpable lymphadenopathy in the cervical, axillary or inguinal LUNGS: clear to auscultation and percussion with normal breathing effort HEART: regular rate & rhythm and no murmurs. ABDOMEN:abdomen soft, non-tender and normal bowel sounds MUSCULOSKELETAL:no cyanosis of digits and no clubbing. Range of motion normal.  NEURO:  alert & oriented x 3 with fluent speech, no focal motor/sensory deficits. Decreased deep tendon reflexes bilaterally.   LABORATORY DATA: Appointment on 06/09/2014  Component Date Value Ref Range Status  . WBC 06/09/2014 5.7  4.0 - 10.5 K/uL Final  . RBC 06/09/2014 4.08  3.87 - 5.11 MIL/uL Final  . Hemoglobin 06/09/2014 13.2  12.0 - 15.0 g/dL Final  . HCT 06/09/2014 37.6  36.0 - 46.0 % Final  . MCV 06/09/2014 92.2  78.0 - 100.0 fL Final  . MCH 06/09/2014 32.4  26.0 - 34.0 pg Final  . MCHC 06/09/2014 35.1  30.0 - 36.0 g/dL Final  . RDW 06/09/2014 13.4  11.5 - 15.5 % Final  . Platelets 06/09/2014 157  150 - 400 K/uL Final  . Neutrophils Relative % 06/09/2014 23* 43 - 77 % Final  . Neutro Abs 06/09/2014 1.3* 1.7 - 7.7 K/uL Final  . Lymphocytes Relative 06/09/2014 66* 12 - 46 % Final  . Lymphs Abs 06/09/2014 3.8  0.7 - 4.0 K/uL Final  . Monocytes Relative 06/09/2014 7  3 - 12 % Final  . Monocytes Absolute 06/09/2014 0.4  0.1 - 1.0 K/uL Final  . Eosinophils Relative 06/09/2014 3  0 - 5 % Final  . Eosinophils Absolute 06/09/2014 0.2  0.0 - 0.7 K/uL Final  . Basophils Relative 06/09/2014 1  0 - 1 % Final  . Basophils Absolute 06/09/2014 0.1  0.0 - 0.1 K/uL Final  . Sodium 06/09/2014 140  137 - 147 mEq/L Final  . Potassium 06/09/2014 4.2  3.7 - 5.3 mEq/L Final  . Chloride 06/09/2014 102  96 - 112 mEq/L Final  . CO2 06/09/2014 27  19 - 32 mEq/L Final  . Glucose, Bld 06/09/2014 172* 70 - 99 mg/dL Final  . BUN 06/09/2014 12  6 - 23 mg/dL Final  . Creatinine, Ser 06/09/2014 0.75  0.50 - 1.10 mg/dL Final  . Calcium 06/09/2014 9.2  8.4 - 10.5 mg/dL Final  . Total Protein 06/09/2014 6.8  6.0 - 8.3 g/dL Final  . Albumin 06/09/2014 3.3* 3.5 - 5.2 g/dL Final  . AST 06/09/2014 24  0 - 37 U/L Final  . ALT 06/09/2014 21  0 - 35 U/L Final  . Alkaline Phosphatase 06/09/2014 66  39 - 117 U/L Final  . Total Bilirubin 06/09/2014 0.4  0.3 - 1.2 mg/dL Final  . GFR calc non Af Amer 06/09/2014 86* >90 mL/min  Final  . GFR calc Af Amer 06/09/2014 >90  >90 mL/min Final   Comment: (NOTE)                          The eGFR has been calculated using the CKD EPI equation.                          This calculation has not been validated in all clinical situations.                          eGFR's persistently <90 mL/min signify possible Chronic Kidney                          Disease.  . Anion gap 06/09/2014 11  5 - 15 Final  . LDH 06/09/2014 239  94 - 250 U/L Final  Office Visit on 05/31/2014  Component Date Value Ref Range Status  . Sodium 05/31/2014 137  135 - 145 mEq/L Final  . Potassium 05/31/2014 3.8  3.5 - 5.3 mEq/L Final  . Chloride 05/31/2014 99  96 - 112 mEq/L Final  . CO2 05/31/2014 31  19 - 32 mEq/L Final  . Glucose, Bld 05/31/2014 185* 70 - 99 mg/dL Final  . BUN 05/31/2014 13  6 - 23 mg/dL Final  . Creat 05/31/2014 0.81  0.50 - 1.10 mg/dL Final  . Calcium 05/31/2014 9.3  8.4 - 10.5 mg/dL Final  Appointment on 05/18/2014  Component Date Value Ref Range Status  . Hemoglobin A1C 05/18/2014 7.2* 4.6 - 6.5 % Final   Glycemic Control Guidelines for People with Diabetes:Non Diabetic:  <6%Goal of Therapy: <7%Additional Action Suggested:  >8%   . Cholesterol 05/18/2014 235* 0 - 200 mg/dL Final   ATP III Classification       Desirable:  < 200 mg/dL               Borderline High:  200 - 239 mg/dL          High:  > = 240 mg/dL  . Triglycerides 05/18/2014 144.0  0.0 - 149.0 mg/dL Final   Normal:  <150 mg/dLBorderline High:  150 - 199 mg/dL  . HDL 05/18/2014 48.80  >39.00 mg/dL Final  . VLDL 05/18/2014 28.8  0.0 - 40.0 mg/dL Final  . LDL Cholesterol 05/18/2014 157* 0 - 99 mg/dL Final  . Total CHOL/HDL Ratio 05/18/2014 5   Final                  Men          Women1/2 Average Risk  3.4          3.3Average Risk          5.0          4.42X Average Risk          9.6          7.13X Average Risk          15.0          11.0                      . NonHDL 05/18/2014 186.20   Final   NOTE:  Non-HDL goal  should be 30 mg/dL higher than patient's LDL goal (i.e. LDL goal of < 70 mg/dL, would have non-HDL goal of < 100 mg/dL)    PATHOLOGY:  FINAL for [name], [name] ([id number]) Patient: [name], [name] Collected: 08/02/2013 Client: [facility] Accession: [id number] Received: 08/02/2013 Robynn Pane, PA-C DOB: 14-Mar-1946 Age: 89 Gender: F Reported: 08/03/2013 [address] Patient Ph: [phone] MRN #: [id number] Linna Hoff [place] [address] Visit #: [id number] Chart #: Phone: Fax: CC: Farrel Gobble, MD FLOW CYTOMETRY REPORT INTERPRETATION Interpretation Peripheral Blood Flow Cytometry - NO MONOCLONAL B-CELL POPULATION OR ABNORMAL T-CELL PHENOTYPE IDENTIFIED. Susanne Greenhouse MD Pathologist, Electronic Signature (Case signed 08/03/2013) GROSS AND MICROSCOPIC INFORMATION Source Peripheral Blood Flow Cytometry Microscopic Gated population: Flow cytometric immunophenotyping is performed using antibiodies to the antigens listed in the table below. Electronic gates are placed around a cell cluster displaying light scatter properties corresponding to lymphocytes. - Abnormal Cells in gated population: NA - Phenotype of Abnormal Cells: NA    FINAL for [name], [name] ([id number]) Patient: [name], [name] Collected: 12/24/2013 Client: [facility] Accession: [id number] Received: 12/27/2013 Selena Batten, MD DOB: Nov 10, 1945 Age: 68 Gender: F Reported: 12/28/2013 [place]. Patient Ph: [phone] MRN #: [id number] [place], [place] [address] Client Acc#: [id number] Chart #: [phone] Phone: Fax: CC: [name] REPORT OF DERMATOPATHOLOGY FINAL DIAGNOSIS and MICROSCOPIC DESCRIPTION Diagnosis Skin Biopsy-(P), (A) left anterior thigh, shave &ED&C SUPERFICIAL AND NODULAR BASAL CELL CARCINOMA Microscopic Description There are aggregates of atypical basaloid cells arising from the epidermis and maintaining continually with epidermis with dermal nodular aggregates.  There is palisading of nuclei at the periphery of these aggregates. ERICA RUSHING MD Dermatopathologist, Electronic Signature (Case signed 12/28/2013) Specimen Gross and Clinical Information Clinical History (A) Pink papule Clinical Diagnosis (A) R/O BCC SPECIMEN(S) OBTAINED Skin Biopsy-(P), (A) left anterior thigh, shave &ED&C Comment Received: glass slide(s) from this client, containing hematoxylin and eosin stained tissue section(s) prepared from specimen(s) with the following gross description(s) submitted with slides: GROSS DESCRIPTION Formalin fixed specimen received: 7 x 7 x 2 toto (3x) (1B) AS Technical component for this case was performed at [facility],    Urinalysis    Component Value Date/Time   COLORURINE YELLOW 01/11/2012 [place] 01/11/2012 2225   LABSPEC <1.005* 01/11/2012 2225   PHURINE 5.5 01/11/2012 2225   GLUCOSEU NEGATIVE 01/11/2012 [place] 01/11/2012 2225   BILIRUBINUR NEGATIVE 01/11/2012 2225   KETONESUR 15* 01/11/2012 2225   PROTEINUR NEGATIVE 01/11/2012 2225   UROBILINOGEN 0.2 01/11/2012 2225   NITRITE NEGATIVE 01/11/2012 2225   LEUKOCYTESUR NEGATIVE 01/11/2012 2225    RADIOGRAPHIC STUDIES: Ct Abdomen Pelvis W Contrast  06/07/2014   CLINICAL DATA:  Lower abdominal pain at prior incision sites.  EXAM: CT ABDOMEN AND PELVIS WITH CONTRAST  TECHNIQUE: Multidetector CT imaging of the abdomen  and pelvis was performed using the standard protocol following bolus administration of intravenous contrast.  CONTRAST:  171m OMNIPAQUE IOHEXOL 300 MG/ML  SOLN  COMPARISON:  CT abdomen pelvis - 01/11/2012  FINDINGS: Normal hepatic contour. There is mild diffuse decreased attenuation hepatic parenchyma on this postcontrast examination suggestive of hepatic steatosis. No discrete hepatic lesions. Post cholecystectomy. No intra extrahepatic biliary duct dilatation. No ascites. There is symmetric enhancement and excretion of the bilateral  kidneys. No renal stones in this postcontrast examination. No discrete renal lesions. No urinary obstruction or perinephric stranding.  Normal appearance of the bilateral adrenal glands and spleen. The pancreas remains largely atrophic. No peripancreatic stranding. No ascites. Normal appearance of the spleen. Incidental note is made of a small splenule.  Ingested enteric contrast extends to the level of the distal small bowel. Interval resolution of previously noted diffuse colonic wall thickening and enhancement. The bowel now appears normal in course and caliber without wall thickening or evidence of obstruction. Normal appearance of the appendix. No pneumoperitoneum, pneumatosis or portal venous gas.  Scattered atherosclerotic plaque within a normal caliber abdominal aorta. The major branch vessels of the abdominal aorta appear widely patent on this non CTA examination.  No retroperitoneal, mesenteric, pelvic or inguinal lymphadenopathy.  Post hysterectomy. No discrete adnexal lesion. No free fluid within the pelvic cul-de-sac. Normal appearance of the urinary bladder given degree distention.  Limited visualization of the lower thorax demonstrates minimal grossly symmetric dependent ground-glass atelectasis. No discrete focal airspace opacities.  Normal heart size.  No pericardial effusion.  No acute or aggressive osseus abnormalities. Moderate to severe facet degenerative change within the lower lumbar spine bilaterally.  Regional soft tissues appear normal. Specifically, there is no definitive evidence of and incisional hernia.  IMPRESSION: 1. No explanation for patient's abdominal pain. Specifically, no evidence of an incisional hernia or recurrent colitis. 2. Hepatic steatosis.   Electronically Signed   By: JSandi MariscalM.D.   On: 06/07/2014 15:45    ASSESSMENT:  #1. Persistent lymphocytosis with negative flow cytometry having been on Remicade for 2 years, possibly an associated lymphoproliferative  disorder which has not been characterized. Troublesome persistent left lower extremity swelling which may represent lymphadenopathy in the pelvis, not found on recent CT scan performed on 06/07/2014.  #2. Ulcerative colitis, with bimonthly Remicade. Remicade #3. Diabetes mellitus with neuropathy.    PLAN:  #1. Patient questions why she continues to be followed in oncology clinic. She was informed that Remicade could be associated with the development of lymphoproliferative disorders as well as skin malignancies. #2. She was be seen again in 6 months at which time CBC, chem profile, LDH, beta-2 microglobulin, and flow cytometry will be performed. #3. She was encouraged to see an otolaryngologist for careful examination to explain blood in her throat in the mornings.   All questions were answered. The patient knows to call the clinic with any problems, questions or concerns. We can certainly see the patient much sooner if necessary.   I spent 30 minutes counseling the patient face to face. The total time spent in the appointment was 40 minutes.    FDoroteo Bradford MD 06/09/2014 11:53 AM  DISCLAIMER:  This note was dictated with voice recognition software.  Similar sounding words can inadvertently be transcribed inaccurately and may not be corrected upon review.

## 2014-06-14 ENCOUNTER — Telehealth: Payer: Self-pay | Admitting: Neurology

## 2014-06-14 NOTE — Telephone Encounter (Signed)
Let's increase to nortriptyline 36m (2 tab) for 2 weeks.  If she is still not having side effects and no improvement of pain, increase to 338m(3 tablets).  Donika K. PaPosey ProntoDO

## 2014-06-14 NOTE — Telephone Encounter (Signed)
Pt called requesting to speak to [facility] regarding her meds. Please call pt [phone]

## 2014-06-14 NOTE — Telephone Encounter (Signed)
Patient said that the nortriptyline has not given her any side effects but it is not helping with the pain.  She is still only taking 10 mg qhs.

## 2014-06-14 NOTE — Telephone Encounter (Signed)
Attempted to contact patient.  No answer and no voicemail.

## 2014-06-15 NOTE — Telephone Encounter (Signed)
Patient given instructions per Dr. Posey Pronto and agreed with plan.

## 2014-06-21 ENCOUNTER — Ambulatory Visit: Payer: Medicare Other

## 2014-06-22 ENCOUNTER — Ambulatory Visit: Payer: Medicare Other

## 2014-06-22 ENCOUNTER — Telehealth: Payer: Self-pay | Admitting: *Deleted

## 2014-06-22 NOTE — Telephone Encounter (Signed)
I called patient back and she said that the 2 pills is helping some but would like to go up one more.  Please advise.  She will run out of meds by Friday so I will need to call in new Rx.

## 2014-06-22 NOTE — Telephone Encounter (Signed)
Patient has questions about the increase of her medication and a new RX Call back number [phone]

## 2014-06-23 ENCOUNTER — Other Ambulatory Visit: Payer: Self-pay | Admitting: *Deleted

## 2014-06-23 MED ORDER — NORTRIPTYLINE HCL 10 MG PO CAPS: 10.0000 mg | ORAL_CAPSULE | Freq: Every day | ORAL | Status: DC

## 2014-06-23 NOTE — Telephone Encounter (Signed)
Patient notified and Rx sent in.

## 2014-06-23 NOTE — Telephone Encounter (Signed)
OK to increase to 19m qhs (3 tab). Pls send new Rx for nortriptyline 351m(3 tab) at bedtime, #90, 3 refills.  Donika K. PaPosey ProntoDO

## 2014-06-28 ENCOUNTER — Encounter (INDEPENDENT_AMBULATORY_CARE_PROVIDER_SITE_OTHER): Payer: Self-pay | Admitting: Internal Medicine

## 2014-06-28 ENCOUNTER — Ambulatory Visit (INDEPENDENT_AMBULATORY_CARE_PROVIDER_SITE_OTHER): Payer: Medicare Other | Admitting: Internal Medicine

## 2014-06-28 VITALS — BP 130/74 | HR 76 | Temp 97.7°F | Resp 18 | Ht 61.0 in | Wt 212.6 lb

## 2014-06-28 DIAGNOSIS — K5190 Ulcerative colitis, unspecified, without complications: Secondary | ICD-10-CM | POA: Diagnosis not present

## 2014-06-28 DIAGNOSIS — K219 Gastro-esophageal reflux disease without esophagitis: Secondary | ICD-10-CM | POA: Diagnosis not present

## 2014-06-28 NOTE — Patient Instructions (Signed)
Notify if you have rectal bleeding or if she has side effects with Remicade

## 2014-06-28 NOTE — Progress Notes (Signed)
Presenting complaint;  Followup for ulcerative colitis and GERD.  Subjective:  Patient is 68 year old Caucasian female who has chronic UC and and presents for 6 month followup. She states heartburn is well controlled. She has to take second dose of Nexium on some days. On most days she needs one. She denies dysphagia nausea or vomiting. She is having formed stools daily. She has not seen blood in her bowel movements and several months. She feels tired and fatigued for about 2 days after each dose of Remicade. She states she just saw Dr. Barnet Glasgow recently and all of her lab studies were unremarkable except lymphocytosis. She remains concerned about ongoing weight gain. She has gained 7 pounds in last 6 months.      Current Medications: Outpatient Encounter Prescriptions as of 06/28/2014  Medication Sig  . azelastine (ASTELIN) 137 MCG/SPRAY nasal spray Place 2 sprays into both nostrils 2 (two) times daily. Use in each nostril as directed  . esomeprazole (NEXIUM) 40 MG capsule Take 1 capsule (40 mg total) by mouth 2 (two) times daily.  . furosemide (LASIX) 40 MG tablet TAKE 1 TABLET DAILY  . glucose blood test strip Test 3 times daily as directed  . hydrocortisone (ANUSOL-HC) 2.5 % rectal cream Place 1 application rectally 2 (two) times daily.  Marland Kitchen inFLIXimab (REMICADE) 100 MG injection Inject into the vein every 8 (eight) weeks.   . insulin aspart (NOVOLOG) 100 UNIT/ML FlexPen 3 times a day (just before each meal) 130-130-130 units, and pen needles 3/day.  . Insulin Detemir (LEVEMIR) 100 UNIT/ML Pen Inject 70 Units into the skin 2 (two) times daily.  . Insulin Pen Needle 31G X 5 MM MISC USE AS DIRECTED BY PHYSICIAN. Test 4x/day  . Insulin Syringe-Needle U-100 31G X 5/16" 1 ML MISC Use as instructed by MD to inject insulin up to 3 times a weeks.  . lidocaine (XYLOCAINE) 5 % ointment Apply to hands and feet twice daily.  Marland Kitchen loperamide (IMODIUM) 2 MG capsule Take 4 mg by mouth 2 (two) times  daily.   . meclizine (ANTIVERT) 25 MG tablet Take 1 tablet (25 mg total) by mouth 3 (three) times daily as needed for dizziness or nausea.  . metoprolol succinate (TOPROL-XL) 100 MG 24 hr tablet Take 1 tablet (100 mg total) by mouth 2 (two) times daily.  . Needle, Disp, (BD SAFETYGLIDE NEEDLE) 27G X 5/8" MISC Use to dispense insulin at bedtime Dx 250.00  . nortriptyline (PAMELOR) 10 MG capsule Take 1 capsule (10 mg total) by mouth at bedtime. Take 3 capsules (30 mg) by mouth at bedtime.  Glory Rosebush DELICA LANCETS 58N MISC PATIENT USES ONE TOUCH DELICAL 27P LANCETS. USE AS DIRECTED THREE DAILY' DIAGNOSIS CODE 250.02  . OVER THE COUNTER MEDICATION Insync - Probiotic take 1 by mouth daily  . potassium chloride (KLOR-CON M10) 10 MEQ tablet TAKE 2 TABLETS (20 MEQ) DAILY  . [DISCONTINUED] nortriptyline (PAMELOR) 10 MG capsule Take 10 mg by mouth at bedtime.  . [DISCONTINUED] phentermine 37.5 MG capsule Take 1 capsule (37.5 mg total) by mouth every morning.    Objective: Blood pressure 130/74, pulse 76, temperature 97.7 F (36.5 C), temperature source Oral, resp. rate 18, height 5' 1"  (1.549 m), weight 212 lb 9.6 oz (96.435 kg). Patient is alert and in no acute distress. Conjunctiva is pink. Sclera is nonicteric Oropharyngeal mucosa is normal. No neck masses or thyromegaly noted. Cardiac exam with regular rhythm normal S1 and S2. No murmur or gallop noted. Lungs are clear to  auscultation. Abdomen is full but soft and nontender without organomegaly or masses. 1+ pitting edema involving both legs.  Labs/studies Results: Lab data from 06/09/2014. WBC 5.7, H&H 13.2 and 37.6 and platelet count 157K  Neutrophils 23% and lymphocytes 66%  Serum sodium 140, potassium 4.2, chloride 102, CO2 27, BUN 12, creatinine 0.75 Glucose 172 Bilirubin 0.4, P. 66, AST 24, ALT 21, total protein 6.8 and albumin 3.3 Serum calcium 9.2.     Assessment:  #1. Chronic ulcerative colitis. She remains in remission and  is on maintenance Remicade. Last full colonoscopy was in 2009 she did have flexible sigmoidoscopy in April 2013 when she had a flareup of UC. #2. GERD. Symptoms well-controlled with therapy. #3. Lymphocytosis. She is being followed by Dr. Barnet Glasgow. Recent flow cytometry on 06/09/2014 negative for abnormal cells. This abnormality felt to be secondary to Remicade infliximab.  Plan:  Continue present therapy. Office visit in 6 months. Colonoscopy following next visit.

## 2014-06-29 ENCOUNTER — Encounter (INDEPENDENT_AMBULATORY_CARE_PROVIDER_SITE_OTHER): Payer: Self-pay | Admitting: *Deleted

## 2014-07-08 ENCOUNTER — Other Ambulatory Visit: Payer: Self-pay

## 2014-07-08 ENCOUNTER — Telehealth: Payer: Self-pay

## 2014-07-08 MED ORDER — INSULIN ASPART 100 UNIT/ML FLEXPEN: PEN_INJECTOR | SUBCUTANEOUS | Status: DC

## 2014-07-08 NOTE — Telephone Encounter (Signed)
Pt called and left a VM requesting a call back regarding a rx question  Called and lvm for pt to call back if there is still a question.

## 2014-07-08 NOTE — Telephone Encounter (Signed)
Requested a rx refill of Novolog to be sent to [facility].

## 2014-07-12 ENCOUNTER — Encounter (HOSPITAL_COMMUNITY)
Admission: RE | Admit: 2014-07-12 | Discharge: 2014-07-12 | Disposition: A | Discharge status: Home / Self Care | Payer: Medicare Other | Source: Ambulatory Visit | Attending: Internal Medicine | Admitting: Internal Medicine

## 2014-07-12 ENCOUNTER — Encounter (HOSPITAL_COMMUNITY): Payer: Self-pay

## 2014-07-12 DIAGNOSIS — K219 Gastro-esophageal reflux disease without esophagitis: Secondary | ICD-10-CM | POA: Insufficient documentation

## 2014-07-12 DIAGNOSIS — K5190 Ulcerative colitis, unspecified, without complications: Secondary | ICD-10-CM | POA: Insufficient documentation

## 2014-07-12 DIAGNOSIS — I10 Essential (primary) hypertension: Secondary | ICD-10-CM | POA: Insufficient documentation

## 2014-07-12 DIAGNOSIS — E119 Type 2 diabetes mellitus without complications: Secondary | ICD-10-CM | POA: Diagnosis not present

## 2014-07-12 MED ORDER — LORATADINE 10 MG PO TABS
10.0000 mg | ORAL_TABLET | Freq: Every day | ORAL | Status: DC
  Administered 2014-07-12: 10 mg via ORAL

## 2014-07-12 MED ORDER — LORATADINE 10 MG PO TABS
ORAL_TABLET | ORAL | Status: AC
  Filled 2014-07-12: qty 1

## 2014-07-12 MED ORDER — ACETAMINOPHEN 325 MG PO TABS
ORAL_TABLET | ORAL | Status: AC
  Filled 2014-07-12: qty 2

## 2014-07-12 MED ORDER — ACETAMINOPHEN 325 MG PO TABS
650.0000 mg | ORAL_TABLET | Freq: Once | ORAL | Status: AC
  Administered 2014-07-12: 650 mg via ORAL

## 2014-07-12 MED ORDER — SODIUM CHLORIDE 0.9 % IV SOLN
500.0000 mg | INTRAVENOUS | Status: DC
  Administered 2014-07-12: 500 mg via INTRAVENOUS
  Filled 2014-07-12: qty 50

## 2014-07-12 MED ORDER — SODIUM CHLORIDE 0.9 % IV SOLN
INTRAVENOUS | Status: DC
  Administered 2014-07-12: 250 mL via INTRAVENOUS

## 2014-07-21 ENCOUNTER — Ambulatory Visit (INDEPENDENT_AMBULATORY_CARE_PROVIDER_SITE_OTHER): Payer: Medicare Other | Admitting: Neurology

## 2014-07-21 ENCOUNTER — Encounter: Payer: Self-pay | Admitting: Neurology

## 2014-07-21 VITALS — BP 128/84 | HR 81 | Ht 62.0 in | Wt 216.3 lb

## 2014-07-21 DIAGNOSIS — G629 Polyneuropathy, unspecified: Secondary | ICD-10-CM | POA: Diagnosis not present

## 2014-07-21 DIAGNOSIS — K5190 Ulcerative colitis, unspecified, without complications: Secondary | ICD-10-CM | POA: Diagnosis not present

## 2014-07-21 DIAGNOSIS — R040 Epistaxis: Secondary | ICD-10-CM | POA: Diagnosis not present

## 2014-07-21 DIAGNOSIS — E1142 Type 2 diabetes mellitus with diabetic polyneuropathy: Secondary | ICD-10-CM | POA: Diagnosis not present

## 2014-07-21 MED ORDER — GABAPENTIN 100 MG PO CAPS: ORAL_CAPSULE | ORAL | Status: DC

## 2014-07-21 MED ORDER — NORTRIPTYLINE HCL 25 MG PO CAPS: 25.0000 mg | ORAL_CAPSULE | Freq: Every day | ORAL | Status: DC

## 2014-07-21 NOTE — Patient Instructions (Addendum)
1.  Start taking nortriptyline 71m at bedtime 2.  Start taking neurontin 1054mat bedtime for one week, then increase to one tablet twice daily 3.  Return to clinic in 4-6 months

## 2014-07-21 NOTE — Progress Notes (Signed)
Follow-up Visit   Date: 07/21/2014    [name] MRN: [id number] DOB: 1946/09/26   Interim History: [name] is a 68 y.o. right-handed Caucasian female with history of hyperlipidemia, diabetes mellitus (dx~2010, HbA1c 7.2, on insulin), hypertension, ulcerative colitis (dx 1980s, on Remicade, followed by Dr. Laural [name] in [place]), and GERD returning to the clinic for follow-up of small fiber neuropathy.    History of present illness: For the past 20 years, she has developed burning pain of the hands and feet. Pain initially started in her feet, described as burning and intermittent. Over the past 5 years, symptoms have worsened in because now it involves her hands and she has associated swelling and tightness. She complains of predominately burning pain involving the toes and fingers, with mild numbness over the tips of her fingers. Heat improves her pain and "cold makes them feel as if they are going to fall off." Activity tends to improve stiffness, but worsens her pain.   She has previously been seen by Dr. Jacelyn Grip here from 06/2011 - 04/2012 for neuropathy. At the time of her initial presentation in 2012, she was not on immunosuppressive medication and do to concerns of peripheral neuropathy secondary to ulcerative colitis, she was eventually started on Remicade. Since starting Remicade infusions, there has been no change, if not, reports feeling there may be worsening. She has previously tried gabapentin, amitriptyline, and nortriptyline.   She has been seeing Dr. Daylene Katayama, orthopedic physician, for handed dysesthesias. She underwent EMG in 2014 which are bilateral carpal tunnel syndrome worse on the right side. She initially underwent right CTS release in August which helped her symptoms 30%, so also underwent release on the left which reduced pain by 25%. Despite her CTS release, she continues to have severe neuropathic pain. She was started on neurontin, but developed hand and feet  swelling so it was stopped.   She endorses sweating less, dry mouth, and reports to having history surgery for dry eyes.   - Follow-up 04/20/2014:  She was started on lidocaine ointment and venlafaxine 37.83m at her last visit, but stopped venlafaxine due to nausea.  It did seem to help with sharp shooting pains, but not enough to stay on it.  She is getting mild relief with lidocaine ointment to where she can rest at night. He continues to have a sensation of a band around her hands. Her ulcerative colitis seems to be fairly well-controlled at this time. She is planning on seeing endocrinology for her diabetes next week.  UPDATE 07/21/2014:  She reports having 50% improvement in her pain, because she can have spells of no pain.  She is taking nortripyline 324mand noticed worsening of dry mouth.  No new neurological complaints.  She is also complaining of weight gain over the past year and arthritis pain.  Medications:  Current Outpatient Prescriptions on File Prior to Visit  Medication Sig Dispense Refill  . azelastine (ASTELIN) 137 MCG/SPRAY nasal spray Place 2 sprays into both nostrils 2 (two) times daily. Use in each nostril as directed  30 mL  2  . esomeprazole (NEXIUM) 40 MG capsule Take 1 capsule (40 mg total) by mouth 2 (two) times daily.  180 capsule  3  . furosemide (LASIX) 40 MG tablet TAKE 1 TABLET DAILY  90 tablet  3  . glucose blood test strip Test 3 times daily as directed  400 each  2  . hydrocortisone (ANUSOL-HC) 2.5 % rectal cream Place 1 application rectally 2 (  two) times daily.  30 g  1  . inFLIXimab (REMICADE) 100 MG injection Inject into the vein every 8 (eight) weeks.       . insulin aspart (NOVOLOG) 100 UNIT/ML FlexPen 3 times a day (just before each meal) 130-130-130 units, and pen needles 3/day.  40 pen  2  . Insulin Detemir (LEVEMIR) 100 UNIT/ML Pen Inject 70 Units into the skin 2 (two) times daily.  130 mL  3  . Insulin Pen Needle 31G X 5 MM MISC USE AS DIRECTED BY  PHYSICIAN. Test 4x/day  600 each  3  . Insulin Syringe-Needle U-100 31G X 5/16" 1 ML MISC Use as instructed by MD to inject insulin up to 3 times a weeks.  100 each  3  . lidocaine (XYLOCAINE) 5 % ointment Apply to hands and feet twice daily.  50 g  3  . loperamide (IMODIUM) 2 MG capsule Take 4 mg by mouth 2 (two) times daily.       . meclizine (ANTIVERT) 25 MG tablet Take 1 tablet (25 mg total) by mouth 3 (three) times daily as needed for dizziness or nausea.  90 tablet  0  . metoprolol succinate (TOPROL-XL) 100 MG 24 hr tablet Take 1 tablet (100 mg total) by mouth 2 (two) times daily.  180 tablet  3  . Needle, Disp, (BD SAFETYGLIDE NEEDLE) 27G X 5/8" MISC Use to dispense insulin at bedtime Dx 250.00  90 each  3  . ONETOUCH DELICA LANCETS 38H MISC PATIENT USES ONE TOUCH DELICAL 82X LANCETS. USE AS DIRECTED THREE DAILY' DIAGNOSIS CODE 250.02  300 each  2  . OVER THE COUNTER MEDICATION Insync - Probiotic take 1 by mouth daily      . potassium chloride (KLOR-CON M10) 10 MEQ tablet TAKE 2 TABLETS (20 MEQ) DAILY  180 tablet  3  . [DISCONTINUED] amitriptyline (ELAVIL) 25 MG tablet Take 1 tablet (25 mg total) by mouth at bedtime. start with 1/2 tab at night for 1 week then increase to 1 whole tab.  30 tablet  3  . [DISCONTINUED] bromocriptine (PARLODEL) 2.5 MG tablet Take 2.5 mg by mouth 2 (two) times daily.        . [DISCONTINUED] potassium chloride (K-DUR) 10 MEQ tablet Take 2 tablets (20 mEq total) by mouth daily.  90 tablet  1   No current facility-administered medications on file prior to visit.    Allergies:  Allergies  Allergen Reactions  . Actos [Pioglitazone] Other (See Comments)    EDEMA   . Benzocaine-Menthol Swelling    SWELLING OF MOUTH  . Colesevelam Other (See Comments)    GI UPSET  . Flagyl [Metronidazole Hcl] Other (See Comments)    DIAPHORESIS  . Metformin And Related Diarrhea  . Omeprazole Swelling    SWELLING OF TONGUE AND THROAT  . Shrimp [Shellfish Allergy] Itching     OF THROAT AND EARS  . Statins Other (See Comments)    HEART RACING  . Desipramine Hcl Itching, Nausea Only and Other (See Comments)    "swimmy" headed, ears itched   . Hydromorphone Itching  . Adhesive [Tape] Other (See Comments)    SKIN IRRITATION AND BRUISING  . Nisoldipine Itching  . Percocet [Oxycodone-Acetaminophen] Itching     Review of Systems:  CONSTITUTIONAL: No fevers, chills, night sweats, or weight loss.   EYES: No visual changes or eye pain ENT: No hearing changes.  No history of nose bleeds.   RESPIRATORY: No cough, wheezing and shortness of  breath.   CARDIOVASCULAR: Negative for chest pain, and palpitations.   GI: Negative for abdominal discomfort, blood in stools or black stools.  No recent change in bowel habits.   GU:  No history of incontinence.   MUSCLOSKELETAL: +history of joint pain or swelling.  No myalgias.   SKIN: Negative for lesions, rash, and itching.   ENDOCRINE: Negative for cold or heat intolerance, polydipsia or goiter.   PSYCH:  No depression or anxiety symptoms.   NEURO: As Above.   Vital Signs:  BP 128/84  Pulse 81  Ht 5' 2"  (1.575 m)  Wt 216 lb 5 oz (98.119 kg)  BMI 39.55 kg/m2  SpO2 95%  Neurological Exam: MENTAL STATUS including orientation to time, place, person is normal.  Speech is not dysarthric.  CRANIAL NERVES:  Pupils equal round and reactive to light.  Normal conjugate, extra-ocular eye movements in all directions of gaze.  No ptosis. Face is symmetric. Palate elevates symmetrically.  Tongue is midline and appears dry.   MOTOR:  Motor strength is 5/5 in all extremities.    MSRs:  Right      Left  brachioradialis  2+   brachioradialis  2+   biceps  2+   biceps  2+   triceps  2+   triceps  2+   patellar  3+   patellar  3+   ankle jerk  1+   ankle jerk  1+   Hoffman  no   Hoffman  no   plantar response  down   plantar response  down    SENSORY: Vibration is reduced to 50% at the knees bilaterally.   COORDINATION/GAIT:    Gait wide-based and stable.   Data: EMG performed 03/17/2013 at [facility]: Shows bilateral carpal tunnel syndrome, mild to moderate in degree, and worse on the right.   MRI cervical spine wo contrast 12/05/2011:  1. Spondylosis and broad-based central disc protrusion at C5-C6 contribute to moderate spinal stenosis with probable mild left greater than right foraminal narrowing. Foraminal assessment is mildly motion limited; consider oblique plain film correlation. No abnormal cord signal is seen.  2. Asymmetric facet hypertrophy on the left at C6-C7 contributes to mild to moderate left foraminal stenosis.  3. No other significant spinal stenosis or nerve root encroachment identified.   MRI cervical spine wo contrast 05/12/2012:  1. New focal soft disc protrusion at C5-6 into the right lateral recess compressing the right C6 nerve and slightly compressing the  adjacent spinal cord.  2. No other significant change since the prior study.  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 (on remicaide) and diabetes mellitus (on insulin, HbA1c 7.2)   - Clinically doing much better with respect to pain relief with nortriptyline but having dry mouth  - Discussed options of alternative medications and decided to reduce notriptyline and try gabapentin (prefers generics)  - She is very intolerant to medications so optimal optimal dosing cannot be achieved  - Previously tried:  Amitriptyline (foggy), desipramine (GI sie effects), venlafaxine (nausea)  - Options include cymbalta, alpha lipoic acid, as well as others not so commonly used for neuropathic pain such as lamictal, depakote, and topamax.     PLAN/RECOMMENDATIONS:  1.  Reduce nortriptyline to 24m at bedtime.  Encouraged to keep sugar free candy with  her for dry  mouth 2.  Start taking neurontin 119m at bedtime for one week, then increase to one tablet twice daily 3.  She is also getting benefit from Asper cream 4.  Encouraged water exercises for weight loss 5.  Follow-up with PCP regarding weight loss strategies and arthritis treatment options 6.  Return to clinic in 4-6 months    The duration of this appointment visit was 25 minutes of face-to-face time with the patient.  Greater than 50% of this time was spent in counseling, explanation of diagnosis, planning of further management, and coordination of care.   Thank you for allowing me to participate in patient's care.  If I can answer any additional questions, I would be pleased to do so.    Sincerely,    Donika K. PPosey Pronto DO

## 2014-08-01 ENCOUNTER — Encounter: Payer: Self-pay | Admitting: Neurology

## 2014-08-18 ENCOUNTER — Ambulatory Visit (INDEPENDENT_AMBULATORY_CARE_PROVIDER_SITE_OTHER): Payer: Medicare Other | Admitting: Internal Medicine

## 2014-08-18 ENCOUNTER — Encounter: Payer: Self-pay | Admitting: Internal Medicine

## 2014-08-18 ENCOUNTER — Other Ambulatory Visit (INDEPENDENT_AMBULATORY_CARE_PROVIDER_SITE_OTHER): Payer: Medicare Other

## 2014-08-18 VITALS — BP 132/88 | HR 72 | Temp 97.9°F | Ht 62.0 in | Wt 216.5 lb

## 2014-08-18 DIAGNOSIS — IMO0002 Reserved for concepts with insufficient information to code with codable children: Secondary | ICD-10-CM

## 2014-08-18 DIAGNOSIS — E669 Obesity, unspecified: Secondary | ICD-10-CM

## 2014-08-18 DIAGNOSIS — Z23 Encounter for immunization: Secondary | ICD-10-CM | POA: Diagnosis not present

## 2014-08-18 DIAGNOSIS — E1165 Type 2 diabetes mellitus with hyperglycemia: Secondary | ICD-10-CM | POA: Diagnosis not present

## 2014-08-18 DIAGNOSIS — I10 Essential (primary) hypertension: Secondary | ICD-10-CM | POA: Diagnosis not present

## 2014-08-18 LAB — HEMOGLOBIN A1C: HEMOGLOBIN A1C: 6.8 % — AB (ref 4.6–6.5)

## 2014-08-18 LAB — MICROALBUMIN / CREATININE URINE RATIO
Creatinine,U: 155 mg/dL
MICROALB/CREAT RATIO: 0.7 mg/g (ref 0.0–30.0)
Microalb, Ur: 1.1 mg/dL (ref 0.0–1.9)

## 2014-08-18 MED ORDER — LIRAGLUTIDE 18 MG/3ML ~~LOC~~ SOPN: 1.8000 mg | PEN_INJECTOR | Freq: Every day | SUBCUTANEOUS | Status: DC

## 2014-08-18 NOTE — Assessment & Plan Note (Signed)
The current medical regimen is effective;  continue present plan and medications. BP Readings from Last 3 Encounters:  08/18/14 132/88  07/21/14 128/84  07/12/14 140/76

## 2014-08-18 NOTE — Patient Instructions (Signed)
It was good to see you today.  We have reviewed your prior records including labs and tests today  Your annual flu shot and Prevnar (pneumonia vaccine 13 valent) was given and/or updated today.  Test(s) ordered today. Your results will be released to [facility] (or called to you) after review, usually within 72hours after test completion. If any changes need to be made, you will be notified at that same time.  Medications reviewed and updated Begin Victoza as discussed for management of diabetes. May also assist with weight loss. Your prescription(s) have been submitted to your mail order pharmacy. Please take as directed and contact our office if you believe you are having problem(s) with the medication(s).  No other changes recommended at this time. Refill on medication(s) as discussed today.  Please schedule followup in 3-4 months, call sooner if problems.

## 2014-08-18 NOTE — Progress Notes (Signed)
Subjective:    Patient ID: [name], female    DOB: 1946-03-17, 68 y.o.   MRN: [id number]  HPI  Patient is here for follow up  Reviewed chronic medical issues and interval medical events  Past Medical History  Diagnosis Date  . GERD   . Osteoarthritis   . HYPERTENSION   . IDDM (insulin dependent diabetes mellitus)   . Ulcerative colitis     Remicade infusion Q 8 weeks  . History of thrombocytopenia 12/2011  . 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 Dental bridge present     lower  . Anxiety     Review of Systems  Constitutional: Positive for unexpected weight change (continued weight gain). Negative for fatigue.  Respiratory: Negative for cough and shortness of breath.   Cardiovascular: Negative for chest pain and leg swelling.  Musculoskeletal: Positive for myalgias (chronic) and arthralgias (B knees, working with ortho on same).       Objective:   Physical Exam  BP 132/88 mmHg  Pulse 72  Temp(Src) 97.9 F (36.6 C) (Oral)  Ht 5' 2"  (1.575 m)  Wt 216 lb 8 oz (98.204 kg)  BMI 39.59 kg/m2  SpO2 98% Wt Readings from Last 3 Encounters:  08/18/14 216 lb 8 oz (98.204 kg)  07/21/14 216 lb 5 oz (98.119 kg)  07/12/14 212 lb (96.163 kg)   Constitutional: She is obese, appears well-developed and well-nourished. No distress.  Neck: Normal range of motion. Neck supple. No JVD present. No thyromegaly present.  Cardiovascular: Normal rate, regular rhythm and normal heart sounds.  No murmur heard. No BLE edema. Pulmonary/Chest: Effort normal and breath sounds normal. No respiratory distress. She has no wheezes.  Psychiatric: She has a normal mood and affect. Her behavior is normal. Judgment and thought content normal.   Lab Results  Component Value Date   WBC 5.7 06/09/2014   HGB 13.2 06/09/2014   HCT 37.6 06/09/2014   PLT 157 06/09/2014   GLUCOSE 172* 06/09/2014   CHOL 235* 05/18/2014   TRIG 144.0 05/18/2014   HDL 48.80 05/18/2014   LDLDIRECT 69.5 01/24/2011   LDLCALC 157* 05/18/2014   ALT 21 06/09/2014   AST 24 06/09/2014   NA 140 06/09/2014   K 4.2 06/09/2014   CL 102 06/09/2014   CREATININE 0.75 06/09/2014   BUN 12 06/09/2014   CO2 27 06/09/2014   TSH 1.400 03/04/2014   INR 1.25 01/14/2012   HGBA1C 7.2* 05/18/2014   MICROALBUR 0.6 12/07/2012    No results found.     Assessment & Plan:   Problem List Items Addressed This Visit    DM (diabetes mellitus), type 2, uncontrolled - Primary    Prev followed with endo ([facility]) for same - but now follows here only Reviewed home cbgs - range 70-200s Added NPH 11/2013 to ongoing TID aspart - no significant change Changed NPH to Levemir 01/2014 because spouse on same - improved readings, but concerned about weight gain Add Victoza for DM and to benefit weight reduction goals Pt understands we will refer back to endo (outside provider) if unsuccessful managing same here Reviewed insulin resistance and poor tolerance of many oral medications in prior trials associated with neuropathy in feet - but intol of prior elavil trial - trying various TCAs since 03/2014 per neuro Intermittently on gaba    Lab Results  Component Value Date   HGBA1C 7.2* 05/18/2014      Relevant Medications  Liraglutide (VICTOZA) 18 MG/3ML SOPN   Other Relevant Orders      Hemoglobin A1c      Microalbumin / creatinine urine ratio   Essential hypertension    The current medical regimen is effective;  continue present plan and medications. BP Readings from Last 3 Encounters:  08/18/14 132/88  07/21/14 128/84  07/12/14 140/76      Obese    Wt Readings from Last 3 Encounters:  08/18/14 216 lb 8 oz (98.204 kg)  07/21/14 216 lb 5 oz (98.119 kg)  07/12/14 212 lb (96.163 kg)   04/2014 tried rx of Phentermine to help weight loss goals - not covered by insurance? Will instead add Victoza for DM tx    Relevant Medications      Liraglutide (VICTOZA) 18 MG/3ML SOPN

## 2014-08-18 NOTE — Assessment & Plan Note (Signed)
Wt Readings from Last 3 Encounters:  08/18/14 216 lb 8 oz (98.204 kg)  07/21/14 216 lb 5 oz (98.119 kg)  07/12/14 212 lb (96.163 kg)   04/2014 tried rx of Phentermine to help weight loss goals - not covered by insurance? Will instead add Victoza for DM tx

## 2014-08-18 NOTE — Assessment & Plan Note (Signed)
Prev followed with endo ([facility]) for same - but now follows here only Reviewed home cbgs - range 70-200s Added NPH 11/2013 to ongoing TID aspart - no significant change Changed NPH to Levemir 01/2014 because spouse on same - improved readings, but concerned about weight gain Add Victoza for DM and to benefit weight reduction goals Pt understands we will refer back to endo (outside provider) if unsuccessful managing same here Reviewed insulin resistance and poor tolerance of many oral medications in prior trials associated with neuropathy in feet - but intol of prior elavil trial - trying various TCAs since 03/2014 per neuro Intermittently on gaba    Lab Results  Component Value Date   HGBA1C 7.2* 05/18/2014

## 2014-08-18 NOTE — Progress Notes (Signed)
Pre visit review using our clinic review tool, if applicable. No additional management support is needed unless otherwise documented below in the visit note. 

## 2014-09-02 ENCOUNTER — Telehealth: Payer: Self-pay | Admitting: Neurology

## 2014-09-02 NOTE — Telephone Encounter (Signed)
Pt called regarding having side effects from [place]. Pt's stomach hurts. C/B [phone]

## 2014-09-02 NOTE — Telephone Encounter (Signed)
Called patient back and she said that she has had a stomach ache off on since starting the neurontin.  Instructed her to stop the neurontin and see if she feels better.  She agreed to do so and will call next week with an update.

## 2014-09-05 ENCOUNTER — Telehealth (INDEPENDENT_AMBULATORY_CARE_PROVIDER_SITE_OTHER): Payer: Self-pay | Admitting: *Deleted

## 2014-09-05 ENCOUNTER — Telehealth: Payer: Self-pay | Admitting: Neurology

## 2014-09-05 NOTE — Telephone Encounter (Signed)
Wand has a question about two medications, gabapentin & nortriptyline, she has been put on. Her Acid Reflux has gotten really bad. The return phone number is [phone].

## 2014-09-05 NOTE — Telephone Encounter (Signed)
Pt reports she is unable to tolerate Gabapentin and Nortriptylline, Please call patient at [phone] / Sherri S.

## 2014-09-05 NOTE — Telephone Encounter (Signed)
OK to stop the medication.  Will have to hold on additional medications until she feels better and her GI symptoms settle.

## 2014-09-05 NOTE — Telephone Encounter (Signed)
Patient complaining of dry mouth and up set stomach. She states she has a burning feeling in her stomach/ w  Acid reflux as well. Please advise

## 2014-09-05 NOTE — Telephone Encounter (Signed)
Patient notified

## 2014-09-06 ENCOUNTER — Encounter (HOSPITAL_COMMUNITY)
Admission: RE | Admit: 2014-09-06 | Discharge: 2014-09-06 | Disposition: A | Discharge status: Home / Self Care | Payer: Medicare Other | Source: Ambulatory Visit | Attending: Internal Medicine | Admitting: Internal Medicine

## 2014-09-06 DIAGNOSIS — K5190 Ulcerative colitis, unspecified, without complications: Secondary | ICD-10-CM | POA: Diagnosis not present

## 2014-09-06 MED ORDER — LORATADINE 10 MG PO TABS
10.0000 mg | ORAL_TABLET | Freq: Once | ORAL | Status: AC
  Administered 2014-09-06: 10 mg via ORAL

## 2014-09-06 MED ORDER — SODIUM CHLORIDE 0.9 % IV SOLN
500.0000 mg | Freq: Once | INTRAVENOUS | Status: AC
  Administered 2014-09-06: 500 mg via INTRAVENOUS
  Filled 2014-09-06: qty 50

## 2014-09-06 MED ORDER — LORATADINE 10 MG PO TABS
ORAL_TABLET | ORAL | Status: AC
  Filled 2014-09-06: qty 1

## 2014-09-06 MED ORDER — SODIUM CHLORIDE 0.9 % IV SOLN
Freq: Once | INTRAVENOUS | Status: AC
  Administered 2014-09-06: 12:00:00 via INTRAVENOUS

## 2014-09-06 MED ORDER — ACETAMINOPHEN 325 MG PO TABS
650.0000 mg | ORAL_TABLET | Freq: Once | ORAL | Status: AC
  Administered 2014-09-06: 650 mg via ORAL

## 2014-09-06 MED ORDER — ACETAMINOPHEN 325 MG PO TABS
ORAL_TABLET | ORAL | Status: AC
  Filled 2014-09-06: qty 2

## 2014-09-06 NOTE — Telephone Encounter (Signed)
Patient talked with the prescribing physician of the 2 medications. They have stopped both of them to give her GI track a rest. [name] is asking if there is a medication that she can take for her diabetic neuropathy,that would not cause these symptoms? This is to be reviewed with Dr.Rehman.

## 2014-09-06 NOTE — Telephone Encounter (Signed)
Per Dr.Rehman these are the safest medicines that she can take. Patient will be called and made aware on 09/07/14.

## 2014-09-07 NOTE — Telephone Encounter (Signed)
Per Dr.Rehman the patient may increase her Nexium to twice a day. Any recommendations for her joint pain/diabetic neuropathy will need to come from her Rheumatologist. Patient was called and made aware.

## 2014-09-08 ENCOUNTER — Ambulatory Visit (HOSPITAL_COMMUNITY): Payer: Medicare Other

## 2014-09-08 NOTE — Telephone Encounter (Signed)
Please advise. Per message from her GI doctor today, advised to speak with her Rheumatologist about neuropathy.

## 2014-09-08 NOTE — Telephone Encounter (Signed)
See previous documentation, pt calls today to report continued GI upset. Also says neuropathy in her hands is really uncomfortable. Wants something to help. Please call [phone] / Sherri S.

## 2014-09-09 NOTE — Telephone Encounter (Signed)
Left message with patient's husband for her to call me back.

## 2014-09-09 NOTE — Telephone Encounter (Signed)
If her GI side effects have subsided, she should try to reduce nortriptyline to 74m daily since she had the best benefit with this.  OK to stop gabapentin.  Otherwise, will need to try an other medication - cymbalta 339mdaily.  Side effects include nausea, headache.  If she is agreeable, please send Rx.  Lyliana Dicenso K. PaPosey ProntoDO

## 2014-09-12 ENCOUNTER — Telehealth: Payer: Self-pay | Admitting: Neurology

## 2014-09-12 NOTE — Telephone Encounter (Signed)
Pt is returning your call from Friday please call her at [phone]

## 2014-09-12 NOTE — Telephone Encounter (Signed)
Patient given instructions and will try the lower dose of nortriptyline.  She has stopped the gabapentin.  If this does not work, she will try the Cymbalta.

## 2014-09-12 NOTE — Telephone Encounter (Signed)
See next note

## 2014-09-14 ENCOUNTER — Other Ambulatory Visit: Payer: Self-pay

## 2014-09-14 MED ORDER — INSULIN ASPART 100 UNIT/ML FLEXPEN: 130.0000 [IU] | PEN_INJECTOR | Freq: Three times a day (TID) | SUBCUTANEOUS | Status: DC

## 2014-09-19 ENCOUNTER — Other Ambulatory Visit: Payer: Self-pay | Admitting: *Deleted

## 2014-09-19 ENCOUNTER — Telehealth: Payer: Self-pay | Admitting: Neurology

## 2014-09-19 MED ORDER — DULOXETINE HCL 30 MG PO CPEP: 30.0000 mg | ORAL_CAPSULE | Freq: Every day | ORAL | Status: DC

## 2014-09-19 NOTE — Telephone Encounter (Signed)
Agreed.  She can stop nortriptyline if she is having too much pain.  Saleena Tamas K. Posey Pronto, DO

## 2014-09-19 NOTE — Telephone Encounter (Signed)
Pt called wanting to speak to a nurse regarding her script for [place] C/B  [phone]

## 2014-09-19 NOTE — Telephone Encounter (Signed)
Patient is still complaining of symptoms with nortriptyline.  I sent order in for the Cymbalta for her to try.

## 2014-10-04 ENCOUNTER — Other Ambulatory Visit: Payer: Self-pay | Admitting: Internal Medicine

## 2014-10-10 ENCOUNTER — Other Ambulatory Visit: Payer: Self-pay

## 2014-10-10 ENCOUNTER — Telehealth: Payer: Self-pay | Admitting: Internal Medicine

## 2014-10-10 MED ORDER — "INSULIN SYRINGE-NEEDLE U-100 31G X 5/16"" 1 ML MISC": Status: DC

## 2014-10-10 MED ORDER — "NEEDLE (DISP) 27G X 5/8"" MISC": Status: DC

## 2014-10-10 MED ORDER — INSULIN PEN NEEDLE 31G X 5 MM MISC: Status: DC

## 2014-10-10 MED ORDER — INSULIN ASPART 100 UNIT/ML FLEXPEN: 130.0000 [IU] | PEN_INJECTOR | Freq: Three times a day (TID) | SUBCUTANEOUS | Status: DC

## 2014-10-10 MED ORDER — LIRAGLUTIDE 18 MG/3ML ~~LOC~~ SOPN: 1.8000 mg | PEN_INJECTOR | Freq: Every day | SUBCUTANEOUS | Status: DC

## 2014-10-10 MED ORDER — INSULIN DETEMIR 100 UNIT/ML FLEXPEN: 70.0000 [IU] | PEN_INJECTOR | Freq: Two times a day (BID) | SUBCUTANEOUS | Status: DC

## 2014-10-10 NOTE — Telephone Encounter (Signed)
erx done

## 2014-10-10 NOTE — Telephone Encounter (Signed)
Pt called in and needs [facility] day supply [facility] with [facility] refills .     Insulin ( 3 different insulin pt is on)  Test strips (test 4 to 5 times a day) Nano Needles

## 2014-10-14 ENCOUNTER — Other Ambulatory Visit: Payer: Self-pay

## 2014-10-14 ENCOUNTER — Telehealth: Payer: Self-pay | Admitting: Internal Medicine

## 2014-10-14 MED ORDER — INSULIN PEN NEEDLE 31G X 5 MM MISC: Status: DC

## 2014-10-14 NOTE — Telephone Encounter (Signed)
[facility] calling to clarify directions on insulin pin needles [phone] reference #[id number]

## 2014-11-01 ENCOUNTER — Encounter (HOSPITAL_COMMUNITY)
Admission: RE | Admit: 2014-11-01 | Discharge: 2014-11-01 | Disposition: A | Discharge status: Home / Self Care | Payer: Medicare Other | Source: Ambulatory Visit | Attending: Internal Medicine | Admitting: Internal Medicine

## 2014-11-01 ENCOUNTER — Encounter (HOSPITAL_COMMUNITY): Payer: Self-pay

## 2014-11-01 DIAGNOSIS — K5190 Ulcerative colitis, unspecified, without complications: Secondary | ICD-10-CM | POA: Diagnosis not present

## 2014-11-01 MED ORDER — SODIUM CHLORIDE 0.9 % IV SOLN
INTRAVENOUS | Status: DC
  Administered 2014-11-01: 250 mL via INTRAVENOUS

## 2014-11-01 MED ORDER — ACETAMINOPHEN 325 MG PO TABS
650.0000 mg | ORAL_TABLET | Freq: Once | ORAL | Status: AC
  Administered 2014-11-01: 650 mg via ORAL

## 2014-11-01 MED ORDER — ACETAMINOPHEN 325 MG PO TABS
ORAL_TABLET | ORAL | Status: AC
  Filled 2014-11-01: qty 2

## 2014-11-01 MED ORDER — SODIUM CHLORIDE 0.9 % IV SOLN
500.0000 mg | Freq: Once | INTRAVENOUS | Status: AC
  Administered 2014-11-01: 500 mg via INTRAVENOUS
  Filled 2014-11-01: qty 50

## 2014-11-01 MED ORDER — LORATADINE 10 MG PO TABS
ORAL_TABLET | ORAL | Status: AC
  Filled 2014-11-01: qty 1

## 2014-11-01 MED ORDER — LORATADINE 10 MG PO TABS
10.0000 mg | ORAL_TABLET | Freq: Every day | ORAL | Status: DC
  Administered 2014-11-01: 10 mg via ORAL

## 2014-11-10 DIAGNOSIS — M1711 Unilateral primary osteoarthritis, right knee: Secondary | ICD-10-CM | POA: Diagnosis not present

## 2014-11-10 DIAGNOSIS — M1712 Unilateral primary osteoarthritis, left knee: Secondary | ICD-10-CM | POA: Diagnosis not present

## 2014-11-15 ENCOUNTER — Other Ambulatory Visit: Payer: Self-pay

## 2014-11-15 MED ORDER — METOPROLOL SUCCINATE ER 100 MG PO TB24: 100.0000 mg | ORAL_TABLET | Freq: Two times a day (BID) | ORAL | Status: DC

## 2014-11-15 MED ORDER — ESOMEPRAZOLE MAGNESIUM 40 MG PO CPDR: 40.0000 mg | DELAYED_RELEASE_CAPSULE | Freq: Two times a day (BID) | ORAL | Status: DC

## 2014-11-15 MED ORDER — GLUCOSE BLOOD VI STRP: ORAL_STRIP | Status: DC

## 2014-11-15 MED ORDER — POTASSIUM CHLORIDE CRYS ER 10 MEQ PO TBCR: EXTENDED_RELEASE_TABLET | ORAL | Status: DC

## 2014-11-25 ENCOUNTER — Other Ambulatory Visit: Payer: Self-pay | Admitting: Internal Medicine

## 2014-11-28 ENCOUNTER — Telehealth: Payer: Self-pay | Admitting: Internal Medicine

## 2014-11-28 MED ORDER — ONETOUCH DELICA LANCETS 33G MISC: Status: DC

## 2014-11-28 NOTE — Telephone Encounter (Signed)
erx sent

## 2014-11-28 NOTE — Telephone Encounter (Signed)
Pt called in requesting test strips and her [place] 89U MISC [id number].  She said that she has to have 90 day supply with 3 refills per her ins.   Please send to: [facility]

## 2014-11-29 ENCOUNTER — Other Ambulatory Visit (HOSPITAL_COMMUNITY): Payer: Medicare Other

## 2014-12-01 ENCOUNTER — Other Ambulatory Visit (HOSPITAL_COMMUNITY): Payer: Medicare Other

## 2014-12-01 ENCOUNTER — Encounter (HOSPITAL_COMMUNITY): Payer: Medicare Other | Attending: Internal Medicine

## 2014-12-01 DIAGNOSIS — D72820 Lymphocytosis (symptomatic): Secondary | ICD-10-CM

## 2014-12-01 DIAGNOSIS — D696 Thrombocytopenia, unspecified: Secondary | ICD-10-CM

## 2014-12-01 DIAGNOSIS — K5190 Ulcerative colitis, unspecified, without complications: Secondary | ICD-10-CM | POA: Diagnosis not present

## 2014-12-01 LAB — COMPREHENSIVE METABOLIC PANEL
ALT: 24 U/L (ref 0–35)
AST: 27 U/L (ref 0–37)
Albumin: 3.7 g/dL (ref 3.5–5.2)
Alkaline Phosphatase: 69 U/L (ref 39–117)
Anion gap: 7 (ref 5–15)
BUN: 14 mg/dL (ref 6–23)
CALCIUM: 8.9 mg/dL (ref 8.4–10.5)
CO2: 29 mmol/L (ref 19–32)
CREATININE: 0.72 mg/dL (ref 0.50–1.10)
Chloride: 103 mmol/L (ref 96–112)
GFR calc non Af Amer: 87 mL/min — ABNORMAL LOW (ref >90)
GLUCOSE: 147 mg/dL — AB (ref 70–99)
Potassium: 4.2 mmol/L (ref 3.5–5.1)
SODIUM: 139 mmol/L (ref 135–145)
TOTAL PROTEIN: 7.4 g/dL (ref 6.0–8.3)
Total Bilirubin: 0.5 mg/dL (ref 0.3–1.2)

## 2014-12-01 LAB — CBC WITH DIFFERENTIAL/PLATELET
Basophils Absolute: 0 10*3/uL (ref 0.0–0.1)
Basophils Relative: 1 % (ref 0–1)
EOS ABS: 0.2 10*3/uL (ref 0.0–0.7)
EOS PCT: 3 % (ref 0–5)
HCT: 39.7 % (ref 36.0–46.0)
Hemoglobin: 13.9 g/dL (ref 12.0–15.0)
Lymphocytes Relative: 64 % — ABNORMAL HIGH (ref 12–46)
Lymphs Abs: 4.4 10*3/uL — ABNORMAL HIGH (ref 0.7–4.0)
MCH: 32.9 pg (ref 26.0–34.0)
MCHC: 35 g/dL (ref 30.0–36.0)
MCV: 93.9 fL (ref 78.0–100.0)
MONOS PCT: 7 % (ref 3–12)
Monocytes Absolute: 0.5 10*3/uL (ref 0.1–1.0)
Neutro Abs: 1.8 10*3/uL (ref 1.7–7.7)
Neutrophils Relative %: 25 % — ABNORMAL LOW (ref 43–77)
PLATELETS: 169 10*3/uL (ref 150–400)
RBC: 4.23 MIL/uL (ref 3.87–5.11)
RDW: 13.1 % (ref 11.5–15.5)
WBC: 6.9 10*3/uL (ref 4.0–10.5)

## 2014-12-01 LAB — LACTATE DEHYDROGENASE: LDH: 205 U/L (ref 94–250)

## 2014-12-01 NOTE — Progress Notes (Signed)
LABS FOR L[facility],CBCD,CMP

## 2014-12-05 ENCOUNTER — Ambulatory Visit (HOSPITAL_COMMUNITY): Payer: Medicare Other | Admitting: Hematology & Oncology

## 2014-12-06 ENCOUNTER — Ambulatory Visit: Payer: Medicare Other | Admitting: Internal Medicine

## 2014-12-08 ENCOUNTER — Ambulatory Visit (HOSPITAL_COMMUNITY): Payer: Medicare Other | Admitting: Hematology & Oncology

## 2014-12-19 ENCOUNTER — Telehealth: Payer: Self-pay | Admitting: Internal Medicine

## 2014-12-19 MED ORDER — GLUCOSE BLOOD VI STRP: 1.0000 | ORAL_STRIP | Freq: Four times a day (QID) | Status: DC

## 2014-12-19 NOTE — Telephone Encounter (Signed)
Patient stated that she need a 90 day supply of one touch ultra test strips 3 refills.

## 2014-12-19 NOTE — Telephone Encounter (Signed)
[facility]...[name]

## 2014-12-22 ENCOUNTER — Encounter: Payer: Self-pay | Admitting: Neurology

## 2014-12-22 ENCOUNTER — Ambulatory Visit (INDEPENDENT_AMBULATORY_CARE_PROVIDER_SITE_OTHER): Payer: Medicare Other | Admitting: Neurology

## 2014-12-22 VITALS — BP 118/78 | HR 67 | Ht 62.0 in | Wt 210.1 lb

## 2014-12-22 DIAGNOSIS — G619 Inflammatory polyneuropathy, unspecified: Secondary | ICD-10-CM | POA: Diagnosis not present

## 2014-12-22 DIAGNOSIS — G629 Polyneuropathy, unspecified: Secondary | ICD-10-CM

## 2014-12-22 DIAGNOSIS — E1142 Type 2 diabetes mellitus with diabetic polyneuropathy: Secondary | ICD-10-CM

## 2014-12-22 DIAGNOSIS — G622 Polyneuropathy due to other toxic agents: Secondary | ICD-10-CM

## 2014-12-22 MED ORDER — DULOXETINE HCL 60 MG PO CPEP: 60.0000 mg | ORAL_CAPSULE | Freq: Every day | ORAL | Status: DC

## 2014-12-22 MED ORDER — LIDOCAINE 5 % EX OINT: TOPICAL_OINTMENT | CUTANEOUS | Status: DC

## 2014-12-22 NOTE — Patient Instructions (Addendum)
1.  Increase cymbalta to 4m daily 2.  Return to clinic in 6 months

## 2014-12-22 NOTE — Progress Notes (Signed)
Follow-up Visit   Date: 12/22/2014    [name] MRN: [id number] DOB: 1946-07-02   Interim History: [name] is a 69 y.o. right-handed Caucasian female with history of hyperlipidemia, diabetes mellitus (dx~2010, HbA1c 7.2, on insulin), hypertension, ulcerative colitis (dx 1980s, on Remicade, followed by Dr. Laural [name] in [place]), and GERD returning to the clinic for follow-up of small fiber neuropathy.    History of present illness: For the past 20 years, she has developed burning pain of the hands and feet. Pain initially started in her feet, described as burning and intermittent. Over the past 5 years, symptoms have worsened in because now it involves her hands and she has associated swelling and tightness. She complains of predominately burning pain involving the toes and fingers, with mild numbness over the tips of her fingers. Heat improves her pain and "cold makes them feel as if they are going to fall off." Activity tends to improve stiffness, but worsens her pain.   She has previously been seen by Dr. Jacelyn Grip here from 06/2011 - 04/2012 for neuropathy. At the time of her initial presentation in 2012, she was not on immunosuppressive medication and do to concerns of peripheral neuropathy secondary to ulcerative colitis, she was eventually started on Remicade. Since starting Remicade infusions, there has been no change, if not, reports feeling there may be worsening. She has previously tried gabapentin, amitriptyline, and nortriptyline.   She has been seeing Dr. Daylene Katayama, orthopedic physician, for handed dysesthesias. She underwent EMG in 2014 which are bilateral carpal tunnel syndrome worse on the right side. She initially underwent right CTS release in August which helped her symptoms 30%, so also underwent release on the left which reduced pain by 25%. Despite her CTS release, she continues to have severe neuropathic pain. She was started on neurontin, but developed hand and feet  swelling so it was stopped.   She endorses sweating less, dry mouth, and reports to having history surgery for dry eyes.   - Follow-up 04/20/2014:  She was started on lidocaine ointment and venlafaxine 37.53m at her last visit, but stopped venlafaxine due to nausea.  It did seem to help with sharp shooting pains, but not enough to stay on it.  She is getting mild relief with lidocaine ointment to where she can rest at night. He continues to have a sensation of a band around her hands. Her ulcerative colitis seems to be fairly well-controlled at this time. She is planning on seeing endocrinology for her diabetes next week.  - Follow-up 07/21/2014:  She reports having 50% improvement in her pain, because she can have spells of no pain.  She is taking nortripyline 328mand noticed worsening of dry mouth.  No new neurological complaints.   UPDATE 12/22/2014:  She feels tired all the time and complains of hip and knee pain, which makes her feel weak. She had not had any falls and continues to walk independently.  Denies any side effects to Cymbalta and has noticed mild improvement with her feet dysesthesias, but her hand remain unchanged.  She is no longer on nortriptyline or gabapentin. Praised her for loosing 6lb since her last visit!  Medications:  Current Outpatient Prescriptions on File Prior to Visit  Medication Sig Dispense Refill  . azelastine (ASTELIN) 137 MCG/SPRAY nasal spray Place 2 sprays into both nostrils 2 (two) times daily. Use in each nostril as directed 30 mL 2  . esomeprazole (NEXIUM) 40 MG capsule Take 1 capsule (40 mg total)  by mouth 2 (two) times daily. 180 capsule 3  . furosemide (LASIX) 40 MG tablet Take 1 tablet (40 mg total) by mouth daily. 90 tablet 1  . glucose blood (ONE TOUCH ULTRA TEST) test strip 1 each by Other route 4 (four) times daily. Use to check blood sugars four times a day Dx E11.65 360 each 3  . glucose blood test strip Test 3 times daily as directed 400 each 2    . hydrocortisone (ANUSOL-HC) 2.5 % rectal cream Place 1 application rectally 2 (two) times daily. 30 g 1  . inFLIXimab (REMICADE) 100 MG injection Inject into the vein every 8 (eight) weeks.     . insulin aspart (NOVOLOG) 100 UNIT/ML FlexPen Inject 130 Units into the skin 3 (three) times daily with meals. 40 pen 2  . Insulin Detemir (LEVEMIR) 100 UNIT/ML Pen Inject 70 Units into the skin 2 (two) times daily. 130 mL 3  . Insulin Pen Needle 31G X 5 MM MISC Use pen needle to inject insulin into skin 6 times a day as directed by physician. 600 each 3  . Insulin Syringe-Needle U-100 31G X 5/16" 1 ML MISC Use as instructed by MD to inject insulin up to 3 times a weeks. 100 each 3  . Liraglutide (VICTOZA) 18 MG/3ML SOPN Inject 1.8 mg into the skin daily. 9 mL 5  . loperamide (IMODIUM) 2 MG capsule Take 4 mg by mouth 2 (two) times daily.     . meclizine (ANTIVERT) 25 MG tablet Take 1 tablet (25 mg total) by mouth 3 (three) times daily as needed for dizziness or nausea. 90 tablet 0  . metoprolol succinate (TOPROL-XL) 100 MG 24 hr tablet Take 1 tablet (100 mg total) by mouth 2 (two) times daily. 180 tablet 3  . Needle, Disp, (BD SAFETYGLIDE NEEDLE) 27G X 5/8" MISC Use to dispense insulin at bedtime Dx E11.9 90 each 3  . ONETOUCH DELICA LANCETS 95A MISC PATIENT USES ONE TOUCH DELICAL 21H LANCETS. USE AS DIRECTED THREE DAILY' DIAGNOSIS CODE E11.09 300 each 2  . OVER THE COUNTER MEDICATION Insync - Probiotic take 1 by mouth daily    . potassium chloride (KLOR-CON M10) 10 MEQ tablet TAKE 2 TABLETS (20 MEQ) DAILY 180 tablet 3  . [DISCONTINUED] amitriptyline (ELAVIL) 25 MG tablet Take 1 tablet (25 mg total) by mouth at bedtime. start with 1/2 tab at night for 1 week then increase to 1 whole tab. 30 tablet 3  . [DISCONTINUED] bromocriptine (PARLODEL) 2.5 MG tablet Take 2.5 mg by mouth 2 (two) times daily.      . [DISCONTINUED] potassium chloride (K-DUR) 10 MEQ tablet Take 2 tablets (20 mEq total) by mouth daily. 90  tablet 1   No current facility-administered medications on file prior to visit.    Allergies:  Allergies  Allergen Reactions  . Actos [Pioglitazone] Other (See Comments)    EDEMA   . Benzocaine-Menthol Swelling    SWELLING OF MOUTH  . Colesevelam Other (See Comments)    GI UPSET  . Flagyl [Metronidazole Hcl] Other (See Comments)    DIAPHORESIS  . Metformin And Related Diarrhea  . Omeprazole Swelling    SWELLING OF TONGUE AND THROAT  . Shrimp [Shellfish Allergy] Itching    OF THROAT AND EARS  . Statins Other (See Comments)    HEART RACING  . Desipramine Hcl Itching, Nausea Only and Other (See Comments)    "swimmy" headed, ears itched   . Hydromorphone Itching  . Adhesive [Tape] Other (  See Comments)    SKIN IRRITATION AND BRUISING  . Nisoldipine Itching  . Percocet [Oxycodone-Acetaminophen] Itching     Review of Systems:  CONSTITUTIONAL: No fevers, chills, night sweats, or weight loss.   EYES: No visual changes or eye pain ENT: No hearing changes.  No history of nose bleeds.   RESPIRATORY: No cough, wheezing and shortness of breath.   CARDIOVASCULAR: Negative for chest pain, and palpitations.   GI: Negative for abdominal discomfort, blood in stools or black stools.  No recent change in bowel habits.   GU:  No history of incontinence.   MUSCLOSKELETAL: +history of joint pain or swelling.  No myalgias.   SKIN: Negative for lesions, rash, and itching.   ENDOCRINE: Negative for cold or heat intolerance, polydipsia or goiter.   PSYCH:  No depression or anxiety symptoms.   NEURO: As Above.   Vital Signs:  BP 118/78 mmHg  Pulse 67  Ht _0  (1.575 m)  Wt 210 lb 1 oz (95.284 kg)  BMI 38.41 kg/m2  SpO2 97%  Neurological Exam: MENTAL STATUS including orientation to time, place, person is normal.  Speech is not dysarthric.  CRANIAL NERVES:  Pupils equal round and reactive to light.  Normal conjugate, extra-ocular eye movements in all directions of gaze.  No ptosis.  Face is symmetric.  Dry oral mucosa.   MOTOR:  Motor strength is 5/5 in all extremities.    MSRs:  Right      Left  brachioradialis  2+   brachioradialis  2+   biceps  2+   biceps  2+   triceps  2+   triceps  2+   patellar  3+   patellar  3+   ankle jerk  1+   ankle jerk  1+   Hoffman  no   Hoffman  no   plantar response  down   plantar response  down    SENSORY: Vibration is reduced to 50% at the knees bilaterally.   COORDINATION/GAIT:   Gait wide-based and stable. She is able to rise to stand with arms cross.  Stressed gait intact.  Data: EMG performed 03/17/2013 at [facility]: Shows bilateral carpal tunnel syndrome, mild to moderate in degree, and worse on the right.   MRI cervical spine wo contrast 12/05/2011:  1. Spondylosis and broad-based central disc protrusion at C5-C6 contribute to moderate spinal stenosis with probable mild left greater than right foraminal narrowing. Foraminal assessment is mildly motion limited; consider oblique plain film correlation. No abnormal cord signal is seen.  2. Asymmetric facet hypertrophy on the left at C6-C7 contributes to mild to moderate left foraminal stenosis.  3. No other significant spinal stenosis or nerve root encroachment identified.   MRI cervical spine wo contrast 05/12/2012:  1. New focal soft disc protrusion at C5-6 into the right lateral recess compressing the right C6 nerve and slightly compressing the  adjacent spinal cord.  2. No other significant change since the prior study.  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 (on remicaide) and diabetes mellitus (on insulin, HbA1c 6.8)   - Clinically doing much better with respect to pain relief with nortriptyline but having dry mouth  - Discussed options of alternative medications and decided to  reduce notriptyline and try gabapentin (prefers generics)  - She  is very intolerant to medications so optimal optimal dosing cannot be achieved  - Previously tried:  Amitriptyline (foggy), desipramine (GI side effects), venlafaxine (nausea), nortriptyline, gabapentin (itching)   - Options include alpha lipoic acid, as well as others not so commonly used for neuropathic pain such as lamictal, depakote, and topamax.     PLAN/RECOMMENDATIONS:  1.  Increase Cymbalta 4m daily, continue lidocaine ointment  2.  She is also getting benefit from Asper cream 3.  Encouraged water exercises for weight loss 4.  Patient has chronic dry mouth, she has been taken off nortriptyline which has known adverse effect and denies any benefit, it is less likely medication effect.  Recommend her to discuss this further with her PCP. 5.  Return to clinic in 6 months    The duration of this appointment visit was 25 minutes of face-to-face time with the patient.  Greater than 50% of this time was spent in counseling, explanation of diagnosis, planning of further management, and coordination of care.   Thank you for allowing me to participate in patient's care.  If I can answer any additional questions, I would be pleased to do so.    Sincerely,    Hillarie Harrigan K. PPosey Pronto DO

## 2014-12-27 ENCOUNTER — Encounter (INDEPENDENT_AMBULATORY_CARE_PROVIDER_SITE_OTHER): Payer: Self-pay | Admitting: Internal Medicine

## 2014-12-27 ENCOUNTER — Ambulatory Visit (INDEPENDENT_AMBULATORY_CARE_PROVIDER_SITE_OTHER): Payer: Medicare Other | Admitting: Internal Medicine

## 2014-12-27 ENCOUNTER — Encounter (HOSPITAL_COMMUNITY): Payer: Self-pay | Admitting: Hematology & Oncology

## 2014-12-27 ENCOUNTER — Encounter (HOSPITAL_COMMUNITY)
Admission: RE | Admit: 2014-12-27 | Discharge: 2014-12-27 | Disposition: A | Discharge status: Home / Self Care | Payer: Medicare Other | Source: Ambulatory Visit | Attending: Internal Medicine | Admitting: Internal Medicine

## 2014-12-27 ENCOUNTER — Encounter (HOSPITAL_BASED_OUTPATIENT_CLINIC_OR_DEPARTMENT_OTHER): Payer: Medicare Other | Admitting: Hematology & Oncology

## 2014-12-27 VITALS — BP 157/70 | HR 70 | Temp 98.1°F | Resp 18 | Wt 207.8 lb

## 2014-12-27 VITALS — BP 118/72 | HR 76 | Temp 97.7°F | Resp 18 | Ht 61.0 in | Wt 207.2 lb

## 2014-12-27 DIAGNOSIS — K219 Gastro-esophageal reflux disease without esophagitis: Secondary | ICD-10-CM | POA: Diagnosis not present

## 2014-12-27 DIAGNOSIS — D72820 Lymphocytosis (symptomatic): Secondary | ICD-10-CM

## 2014-12-27 DIAGNOSIS — K5190 Ulcerative colitis, unspecified, without complications: Secondary | ICD-10-CM

## 2014-12-27 DIAGNOSIS — K589 Irritable bowel syndrome without diarrhea: Secondary | ICD-10-CM

## 2014-12-27 MED ORDER — SODIUM CHLORIDE 0.9 % IV SOLN
Freq: Once | INTRAVENOUS | Status: AC
  Administered 2014-12-27: 250 mL via INTRAVENOUS

## 2014-12-27 MED ORDER — ACETAMINOPHEN 325 MG PO TABS
650.0000 mg | ORAL_TABLET | Freq: Four times a day (QID) | ORAL | Status: DC | PRN
  Administered 2014-12-27: 650 mg via ORAL
  Filled 2014-12-27: qty 2

## 2014-12-27 MED ORDER — INFLIXIMAB 100 MG IV SOLR
5.0000 mg/kg | Freq: Once | INTRAVENOUS | Status: AC
  Administered 2014-12-27: 500 mg via INTRAVENOUS
  Filled 2014-12-27: qty 50

## 2014-12-27 MED ORDER — LORATADINE 10 MG PO TABS
10.0000 mg | ORAL_TABLET | Freq: Every day | ORAL | Status: DC
  Administered 2014-12-27: 10 mg via ORAL
  Filled 2014-12-27: qty 1

## 2014-12-27 NOTE — Patient Instructions (Signed)
Call if you have any side effects with Remicade or symptoms relapse

## 2014-12-27 NOTE — Progress Notes (Signed)
Presenting complaint;  Follow-up ulcerative colitis and GERD.  Subjective:  Patient is 69 year old Caucasian female with chronic ulcerative colitis and GERD and is here for scheduled visit. She was last seen 6 months ago. She complains of bloating and intermittent gas pain generally starts in right side of her abdomen and crosses over the left. She denies rectal bleeding or melena. She has anywhere from 1-10 bowel movements per day. Most days she has 1 or 2. On diarrhea days she is able to reduce frequency by taking Imodium which she also takes when she has to leave the house. She does not take Imodium daily. She also complains of dry mouth and has difficulty swallowing which she believes is due to dry mouth. Food bolus on this passes down with few sips of water. Heartburn is well-controlled. On most days she is taking 1 Nexium he is taking no more than 45 doses per month. She is not having any problems with infliximab. She is due for dose today. She saw Dr. Whitney Muse yesterday regarding lymphocytosis felt to be secondary to infliximab.   Current Medications: Outpatient Encounter Prescriptions as of 12/27/2014  Medication Sig  . azelastine (ASTELIN) 137 MCG/SPRAY nasal spray Place 2 sprays into both nostrils 2 (two) times daily. Use in each nostril as directed  . DULoxetine (CYMBALTA) 60 MG capsule Take 1 capsule (60 mg total) by mouth daily.  Marland Kitchen esomeprazole (NEXIUM) 40 MG capsule Take 1 capsule (40 mg total) by mouth 2 (two) times daily.  . furosemide (LASIX) 40 MG tablet Take 1 tablet (40 mg total) by mouth daily.  Marland Kitchen glucose blood (ONE TOUCH ULTRA TEST) test strip 1 each by Other route 4 (four) times daily. Use to check blood sugars four times a day Dx E11.65  . inFLIXimab (REMICADE) 100 MG injection Inject into the vein every 8 (eight) weeks.   . insulin aspart (NOVOLOG) 100 UNIT/ML FlexPen Inject 130 Units into the skin 3 (three) times daily with meals.  . Insulin Detemir (LEVEMIR) 100  UNIT/ML Pen Inject 70 Units into the skin 2 (two) times daily. (Patient taking differently: Inject 70 Units into the skin at bedtime. )  . Insulin Pen Needle 31G X 5 MM MISC Use pen needle to inject insulin into skin 6 times a day as directed by physician.  . Insulin Syringe-Needle U-100 31G X 5/16" 1 ML MISC Use as instructed by MD to inject insulin up to 3 times a weeks.  Marland Kitchen lactase (LACTAID) 3000 UNITS tablet Take 3,000 Units by mouth 3 (three) times daily with meals.  . lidocaine (XYLOCAINE) 5 % ointment Apply to hands and feet twice daily.  Marland Kitchen loperamide (IMODIUM) 2 MG capsule Take 2 mg by mouth 3 (three) times daily as needed.   . meclizine (ANTIVERT) 25 MG tablet Take 1 tablet (25 mg total) by mouth 3 (three) times daily as needed for dizziness or nausea.  . metoprolol succinate (TOPROL-XL) 100 MG 24 hr tablet Take 1 tablet (100 mg total) by mouth 2 (two) times daily.  . Needle, Disp, (BD SAFETYGLIDE NEEDLE) 27G X 5/8" MISC Use to dispense insulin at bedtime Dx E11.9  . ONETOUCH DELICA LANCETS 10U MISC PATIENT USES ONE TOUCH DELICAL 72Z LANCETS. USE AS DIRECTED THREE DAILY' DIAGNOSIS CODE E11.09  . potassium chloride (KLOR-CON M10) 10 MEQ tablet TAKE 2 TABLETS (20 MEQ) DAILY  . Saccharomyces boulardii (FLORASTOR PO) Take by mouth daily.  . Liraglutide (VICTOZA) 18 MG/3ML SOPN Inject 1.8 mg into the skin daily. (Patient not  taking: Reported on 12/27/2014)  . [DISCONTINUED] glucose blood test strip Test 3 times daily as directed (Patient not taking: Reported on 12/27/2014)  . [DISCONTINUED] hydrocortisone (ANUSOL-HC) 2.5 % rectal cream Place 1 application rectally 2 (two) times daily. (Patient not taking: Reported on 12/27/2014)  . [DISCONTINUED] OVER THE COUNTER MEDICATION Insync - Probiotic take 1 by mouth daily     Objective: Blood pressure 118/72, pulse 76, temperature 97.7 F (36.5 C), temperature source Oral, resp. rate 18, height 5' 1"  (1.549 m), weight 207 lb 3.2 oz (93.985 kg). Patient  is alert and in no acute distress. Conjunctiva is pink. Sclera is nonicteric Oropharyngeal mucosa is normal. No neck masses or thyromegaly noted. Cardiac exam with regular rhythm normal S1 and S2. No murmur or gallop noted. Lungs are clear to auscultation. Abdomen is full. Bowel sounds are normal. On palpation abdomen is soft and nontender without organomegaly or masses.  No LE edema or clubbing noted.  Labs/studies Results: Lab data from 12/01/2014  WBC 6.9, H&H 13.9 and 39.7 and platelet count 169K  Differential reveals 25% neutrophils and 64% lymphocytes.   serum sodium 139, potassium 4.2, chloride 103, CO2 29, BUN 14, and creatinine 0.72 Glucose 147 Serum calcium 8.9 Bilirubin 0.5, AP 69, AST 27, ALT 24, total protein 7.4 and albumin 3.7  Assessment:  #1. Ulcerative colitis. She has has been in remission on infliximab. She has developed lymphocytosis with infliximab and evaluation for CLL has been negative and she is being closely followed at oncology clinic by Dr. Whitney Muse. Last full colonoscopy was in May 2012 when she was in remission. Last flexible sigmoidoscopy was in April 2013 when she was found to have relapse of UC along with C. difficile colitis. #2. Irritable bowel syndrome. She has intermittent episodes of nonbloody diarrhea felt to be due to IBS easily controlled with Imodium. #3. GERD. Symptoms well controlled with therapy. She is taking 40 to 45 doses of Nexium per month. Will not change prescription to daily as she will run out of prescription earlier than 30 days.    Plan:  Continue infliximab infusion every 8 weeks. Patient advised to increase physical activity. If she cannot walk she may consider riding stationary bike or join gym. Office visit in 6 months. Surveillance colonoscopy next year.

## 2014-12-27 NOTE — Patient Instructions (Signed)
..  [facility] Discharge Instructions  RECOMMENDATIONS MADE BY THE CONSULTANT AND ANY TEST RESULTS WILL BE SENT TO YOUR REFERRING PHYSICIAN.  Exam today per Dr. Whitney Muse  Return in 6 months with labs    Thank you for choosing [facility] at [facility] to provide your oncology and hematology care.  To afford each patient quality time with our provider, please arrive at least 15 minutes before your scheduled appointment time.    You need to re-schedule your appointment should you arrive 10 or more minutes late.  We strive to give you quality time with our providers, and arriving late affects you and other patients whose appointments are after yours.  Also, if you no show three or more times for appointments you may be dismissed from the clinic at the providers discretion.     Again, thank you for choosing [facility].  Our hope is that these requests will decrease the amount of time that you wait before being seen by our physicians.       _____________________________________________________________  Should you have questions after your visit to [facility], please contact our office at [phone] between the hours of 8:30 a.m. and 4:30 p.m.  Voicemails left after 4:30 p.m. will not be returned until the following business day.  For prescription refill requests, have your pharmacy contact our office.

## 2014-12-27 NOTE — Progress Notes (Signed)
Jennifer Grant, MD [address] [facility] [address] [place] [address]    DIAGNOSIS:  Lymphocytosis with flow cytometry showing no monoclonal B cell population or   abnormal T-cell phenotype    Ulcerative Colitis followed by Dr. Laural [name], Remicade   Basal cell carcinoma Left anterior thigh   CURRENT THERAPY: Observation, Remicade for UC  INTERVAL HISTORY: [name] 69 y.o. female returns for follow-up of lymphocytosis. He has a known history of ulcerative colitis and is on Remicade for maintenance. She follows with Dr. Laural [name]. He has no major complaints today. She states her ulcerative colitis has been fairly well controlled with intermittent episodes of bloating but no diarrhea or blood in her stool. She gets routine screening mammograms. She complains of a chronic dry mouth but has had an evaluation for Sjogren's. She is here today to follow up with her abnormal blood work.  MEDICAL HISTORY: Past Medical History  Diagnosis Date  . GERD   . Osteoarthritis   . HYPERTENSION   . IDDM (insulin dependent diabetes mellitus)   . Ulcerative colitis     Remicade infusion Q 8 weeks  . History of thrombocytopenia 12/2011  . 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 Dental bridge present     lower  . Anxiety     has DM (diabetes mellitus), type 2, uncontrolled; Essential hypertension; GERD; ULCERATIVE COLITIS; MENOPAUSAL DISORDER; PALPITATIONS, RECURRENT; Dyslipidemia; Numbness in feet; Anxiety; Peripheral neuropathy; Insomnia; Elevated LFTs; Myalgia; Polyarthralgia; Allergic sinusitis; Obese; and Fatty liver on her problem list.     is allergic to actos; benzocaine-menthol; colesevelam; flagyl; metformin and related; omeprazole; shrimp; statins; desipramine hcl; hydromorphone; adhesive; nisoldipine; and percocet.  [name] does not currently have medications on file.  SURGICAL HISTORY: Past Surgical History  Procedure Laterality Date  .  Abdominal hysterectomy  1980    partial  . Breast reduction surgery  1994  . Rectocele repair  1990; 09/12/2006  . Carpal tunnel release Right 1996  . Knee arthroscopy Right 01/1999; 10/2000  . Hemilaminotomy lumbar spine Bilateral 09/07/1999    L4-5  . Cholecystectomy    . Flexible sigmoidoscopy  01/17/2012    Procedure: FLEXIBLE SIGMOIDOSCOPY;  Surgeon: Rogene Houston, MD;  Location: [facility];  Service: Endoscopy;  Laterality: N/A;  . Bilateral salpingoophorectomy  02/10/2001  . Tarsal tunnel release  2002  . Ureterolysis Right 02/10/2001  . Lysis of adhesion  02/10/2001  . Carpal tunnel release Left 03/21/2003  . 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 Esophagogastroduodenoscopy (egd) with esophageal dilation  12/02/2005  . Carpal tunnel release Right 05/04/2013    Procedure: CARPAL TUNNEL RELEASE;  Surgeon: Cammie Sickle., MD;  Location: [facility];  Service: Orthopedics;  Laterality: Right;  . Carpal tunnel release Left 09/21/2013    Procedure: LEFT CARPAL TUNNEL RELEASE;  Surgeon: Cammie Sickle., MD;  Location: [facility];  Service: Orthopedics;  Laterality: Left;    SOCIAL HISTORY: History   Social History  . Marital Status: Married    Spouse Name: N/A  . Number of Children: N/A  . Years of Education: N/A   Occupational History  . Not on file.   Social History Main Topics  . Smoking status: Never Smoker   . Smokeless tobacco: Never Used  . Alcohol Use: No  . Drug Use: No  . Sexual Activity:  Partners: Male    Patent examiner Protection: Surgical     Comment: hysterectomy   Other Topics Concern  . Not on file   Social History Narrative   She lives with husband in two-story home.  They have one grown son and 2 grandchildren.   She is retired [employer].   Highest of level education:  Some college    FAMILY HISTORY: Family History  Problem Relation Age of Onset  . Diabetes Mother   .  Hypertension Mother   . Heart attack Father     Mid 28's  . Heart disease Father   . Lung disease Father     spot on lung; had lung surgery  . Alcohol abuse Other   . Hypertension Son   . Diabetes Son     Review of Systems  Constitutional: Negative for fever, chills, weight loss and malaise/fatigue.  HENT: Negative for congestion, hearing loss, nosebleeds, sore throat and tinnitus.        Dry mouth  Eyes: Negative for blurred vision, double vision, pain and discharge.  Respiratory: Negative for cough, hemoptysis, sputum production, shortness of breath and wheezing.   Cardiovascular: Negative for chest pain, palpitations, claudication, leg swelling and PND.  Gastrointestinal: Negative for heartburn, nausea, vomiting, abdominal pain, diarrhea, constipation, blood in stool and melena.       Bloating  Genitourinary: Negative for dysuria, urgency, frequency and hematuria.  Musculoskeletal: Negative for myalgias, joint pain and falls.  Skin: Negative for itching and rash.  Neurological: Negative for dizziness, tingling, tremors, sensory change, speech change, focal weakness, seizures, loss of consciousness, weakness and headaches.  Endo/Heme/Allergies: Does not bruise/bleed easily.  Psychiatric/Behavioral: Negative for depression, suicidal ideas, memory loss and substance abuse. The patient is not nervous/anxious and does not have insomnia.     PHYSICAL EXAMINATION  ECOG PERFORMANCE STATUS: 0 - Asymptomatic  There were no vitals filed for this visit.  Physical Exam  Constitutional: She is oriented to person, place, and time and well-developed, well-nourished, and in no distress.  HENT:  Head: Normocephalic and atraumatic.  Nose: Nose normal.  Mouth/Throat: Oropharynx is clear and moist. No oropharyngeal exudate.  Eyes: Conjunctivae and EOM are normal. Pupils are equal, round, and reactive to light. Right eye exhibits no discharge. Left eye exhibits no discharge. No scleral icterus.    Neck: Normal range of motion. Neck supple. No tracheal deviation present. No thyromegaly present.  Cardiovascular: Normal rate, regular rhythm and normal heart sounds.  Exam reveals no gallop and no friction rub.   No murmur heard. Pulmonary/Chest: Effort normal and breath sounds normal. She has no wheezes. She has no rales.  Abdominal: Soft. Bowel sounds are normal. She exhibits no distension and no mass. There is no tenderness. There is no rebound and no guarding.  Musculoskeletal: Normal range of motion. She exhibits no edema.  Lymphadenopathy:    She has no cervical adenopathy.  Neurological: She is alert and oriented to person, place, and time. She has normal reflexes. No cranial nerve deficit. Gait normal. Coordination normal.  Skin: Skin is warm and dry. No rash noted.  Psychiatric: Mood, memory, affect and judgment normal.  Nursing note and vitals reviewed.   LABORATORY DATA:  CBC    Component Value Date/Time   WBC 6.9 12/01/2014 1239   RBC 4.23 12/01/2014 1239   HGB 13.9 12/01/2014 1239   HCT 39.7 12/01/2014 1239   PLT 169 12/01/2014 1239   MCV 93.9 12/01/2014 1239   MCH 32.9 12/01/2014  1239   MCHC 35.0 12/01/2014 1239   RDW 13.1 12/01/2014 1239   LYMPHSABS 4.4* 12/01/2014 1239   MONOABS 0.5 12/01/2014 1239   EOSABS 0.2 12/01/2014 1239   BASOSABS 0.0 12/01/2014 1239   CMP     Component Value Date/Time   NA 139 12/01/2014 1239   K 4.2 12/01/2014 1239   CL 103 12/01/2014 1239   CO2 29 12/01/2014 1239   GLUCOSE 147* 12/01/2014 1239   BUN 14 12/01/2014 1239   CREATININE 0.72 12/01/2014 1239   CREATININE 0.81 05/31/2014 1626   CALCIUM 8.9 12/01/2014 1239   PROT 7.4 12/01/2014 1239   ALBUMIN 3.7 12/01/2014 1239   AST 27 12/01/2014 1239   ALT 24 12/01/2014 1239   ALKPHOS 69 12/01/2014 1239   BILITOT 0.5 12/01/2014 1239   GFRNONAA 87* 12/01/2014 1239   GFRAA >90 12/01/2014 1239       ASSESSMENT and THERAPY PLAN:   Lymphocytosis  69 year old female with  ulcerative colitis on Remicade with a lymphocytosis. Flow cytometry was negative. I recommended ongoing observation. We will see her back in 6 months. If she is doing well at that time and counts remain stable we will move her visits out to yearly. She is well-versed on the risks of Remicade therapy, at this point obviously benefits far outweigh the risks. In addition she is well aware that potential malignancies from Remicade therapy are very rare.  All questions were answered. The patient knows to call the clinic with any problems, questions or concerns. We can certainly see the patient much sooner if necessary. This note was electronically signed. Molli Hazard 12/27/2014

## 2014-12-28 ENCOUNTER — Encounter (HOSPITAL_COMMUNITY): Payer: Medicare Other

## 2014-12-30 ENCOUNTER — Telehealth: Payer: Self-pay

## 2014-12-30 ENCOUNTER — Telehealth: Payer: Self-pay | Admitting: Internal Medicine

## 2014-12-30 NOTE — Telephone Encounter (Signed)
Patient was sent generic version of esomeprazole (NEXIUM) 40 MG capsule [[id number]] . She states that she needs the name brand version, as the generic makes her tongue/throat swell. She requests that we obtain a prior auth and send name brand esomeprazole (NEXIUM) 40 MG capsule [[id number]] to them.

## 2015-01-03 ENCOUNTER — Other Ambulatory Visit: Payer: Self-pay

## 2015-01-03 MED ORDER — NEXIUM 40 MG PO CPDR: 40.0000 mg | DELAYED_RELEASE_CAPSULE | Freq: Two times a day (BID) | ORAL | Status: DC

## 2015-01-03 NOTE — Telephone Encounter (Signed)
PA approved. Resent rx to pharmacy with note.

## 2015-01-03 NOTE — Telephone Encounter (Signed)
PA started via covermymeds.  PA for Nexium 22m capsule (Name Brand).

## 2015-02-03 ENCOUNTER — Telehealth: Payer: Self-pay | Admitting: Neurology

## 2015-02-03 MED ORDER — PREGABALIN 50 MG PO CAPS: 50.0000 mg | ORAL_CAPSULE | Freq: Two times a day (BID) | ORAL | Status: DC

## 2015-02-03 NOTE — Telephone Encounter (Signed)
Pt reports feeling swelling in her tongue and throat since starting Cymbalta 8 months ago. It has gotten to be too bothersome for her. Please call [phone] / Sherri S.

## 2015-02-03 NOTE — Telephone Encounter (Signed)
I see Dr. Posey Pronto had increased dose on her last visit to 20m. If she was not having these side effects on lower dose, go back to lower dose cymbalta. If symptoms continue on lower dose, stop the medication. Pls forward to Dr. PPosey Prontoafter, fyi  Thanks

## 2015-02-03 NOTE — Telephone Encounter (Signed)
Please advise 

## 2015-02-03 NOTE — Telephone Encounter (Signed)
Spoke with patient and she agreed to try Lyrica. Samples at the front for patient pick up.

## 2015-02-03 NOTE — Telephone Encounter (Signed)
There are no other meds that are really like cymbalta.  Would she like to try lyrica?  If so, can give samples of the 50 mg dosage since dr patels notes indicate been sensitive to a lot of meds.  Just start 50 bid and may need higher in the future if tolerates

## 2015-02-03 NOTE — Telephone Encounter (Signed)
Patient said that she still has the side effects on the lower dose and would like to try another medication similar to the cymbalta.  Is there anything else she can try?

## 2015-02-04 ENCOUNTER — Other Ambulatory Visit: Payer: Self-pay | Admitting: Neurology

## 2015-02-06 ENCOUNTER — Other Ambulatory Visit: Payer: Self-pay | Admitting: *Deleted

## 2015-02-06 MED ORDER — DULOXETINE HCL 60 MG PO CPEP
60.0000 mg | ORAL_CAPSULE | Freq: Every day | ORAL | Status: DC
Start: 2015-02-06 — End: 2015-02-21

## 2015-02-06 NOTE — Telephone Encounter (Signed)
Rx sent 

## 2015-02-16 ENCOUNTER — Telehealth: Payer: Self-pay | Admitting: *Deleted

## 2015-02-16 ENCOUNTER — Telehealth: Payer: Self-pay

## 2015-02-16 NOTE — Telephone Encounter (Signed)
Patient agreed to try pain management.  Referral sent to [facility].

## 2015-02-16 NOTE — Telephone Encounter (Signed)
Patient called stating that since she has started Lyrica, she has been sleeping around the clock, her hands are swollen, she is having strange dreams, her gums are sore and her blood sugars are elevated.  Please advise.  Thanks.

## 2015-02-16 NOTE — Telephone Encounter (Signed)
I spoke with patient. She would like to be referred to another pain management office in [place]. [facility] did call her with an appt with Dr. Vira Blanco but it was for their [facility] office, she's not willing to go that far. Dr. Vira Blanco is only in the [place] office once a month & she states he next available [facility] office appt isn't until June 9th. She states she doesn't want to wait that long because of the pain she's in.

## 2015-02-16 NOTE — Telephone Encounter (Signed)
Pt would like to talk to someone about the [facility] referral please call [phone]

## 2015-02-16 NOTE — Telephone Encounter (Signed)
Recommend that she stops Lyrica.  Unfortunately, we have tried a number of medications which do not seem to help or she develops side effects.  Next step would be pain management referral to see if they have options.  Ayomide Zuleta K. Posey Pronto, DO

## 2015-02-16 NOTE — Telephone Encounter (Signed)
Patient declined scheduling with [facility] she states she would like to stay in the [facility]

## 2015-02-16 NOTE — Telephone Encounter (Signed)
Pt called in and is having issue with pain resolution. OV notes from Neurology and other Telephone notes to review.

## 2015-02-17 NOTE — Telephone Encounter (Signed)
FYI.  Please route back to me.  Thanks.

## 2015-02-17 NOTE — Telephone Encounter (Signed)
Another referral sent to [facility].

## 2015-02-18 NOTE — Telephone Encounter (Signed)
Agree with new pain mgmt refer as done by [facility] neuro office No new orders from me thanks

## 2015-02-21 ENCOUNTER — Ambulatory Visit (INDEPENDENT_AMBULATORY_CARE_PROVIDER_SITE_OTHER): Payer: Medicare Other | Admitting: Internal Medicine

## 2015-02-21 ENCOUNTER — Encounter: Payer: Self-pay | Admitting: Internal Medicine

## 2015-02-21 ENCOUNTER — Other Ambulatory Visit (INDEPENDENT_AMBULATORY_CARE_PROVIDER_SITE_OTHER): Payer: Medicare Other

## 2015-02-21 VITALS — BP 138/82 | HR 63 | Temp 98.3°F | Ht 61.0 in | Wt 214.5 lb

## 2015-02-21 DIAGNOSIS — K5190 Ulcerative colitis, unspecified, without complications: Secondary | ICD-10-CM

## 2015-02-21 DIAGNOSIS — M2550 Pain in unspecified joint: Secondary | ICD-10-CM

## 2015-02-21 DIAGNOSIS — M791 Myalgia, unspecified site: Secondary | ICD-10-CM

## 2015-02-21 DIAGNOSIS — IMO0002 Reserved for concepts with insufficient information to code with codable children: Secondary | ICD-10-CM

## 2015-02-21 DIAGNOSIS — E1165 Type 2 diabetes mellitus with hyperglycemia: Secondary | ICD-10-CM

## 2015-02-21 DIAGNOSIS — R682 Dry mouth, unspecified: Secondary | ICD-10-CM | POA: Diagnosis not present

## 2015-02-21 LAB — BASIC METABOLIC PANEL
BUN: 13 mg/dL (ref 6–23)
CHLORIDE: 102 meq/L (ref 96–112)
CO2: 31 meq/L (ref 19–32)
CREATININE: 0.62 mg/dL (ref 0.40–1.20)
Calcium: 9.2 mg/dL (ref 8.4–10.5)
GFR: 101.89 mL/min (ref >60.00)
Glucose, Bld: 214 mg/dL — ABNORMAL HIGH (ref 70–99)
Potassium: 4.3 mEq/L (ref 3.5–5.1)
SODIUM: 137 meq/L (ref 135–145)

## 2015-02-21 LAB — HEMOGLOBIN A1C: Hgb A1c MFr Bld: 6.5 % (ref 4.6–6.5)

## 2015-02-21 LAB — MICROALBUMIN / CREATININE URINE RATIO
Creatinine,U: 34.2 mg/dL
MICROALB/CREAT RATIO: 2 mg/g (ref 0.0–30.0)
Microalb, Ur: <0.7 mg/dL (ref 0.0–1.9)

## 2015-02-21 MED ORDER — ONETOUCH DELICA LANCETS 33G MISC: Status: DC

## 2015-02-21 MED ORDER — "NEEDLE (DISP) 27G X 5/8"" MISC": Status: DC

## 2015-02-21 MED ORDER — POTASSIUM CHLORIDE CRYS ER 10 MEQ PO TBCR: 20.0000 meq | EXTENDED_RELEASE_TABLET | Freq: Every day | ORAL | Status: DC

## 2015-02-21 MED ORDER — "INSULIN SYRINGE-NEEDLE U-100 31G X 5/16"" 1 ML MISC": Status: DC

## 2015-02-21 MED ORDER — INSULIN DETEMIR 100 UNIT/ML FLEXPEN: 70.0000 [IU] | PEN_INJECTOR | Freq: Every day | SUBCUTANEOUS | Status: DC

## 2015-02-21 MED ORDER — INSULIN ASPART 100 UNIT/ML FLEXPEN: 130.0000 [IU] | PEN_INJECTOR | Freq: Three times a day (TID) | SUBCUTANEOUS | Status: DC

## 2015-02-21 NOTE — Progress Notes (Signed)
Subjective:    Patient ID: [name], female    DOB: 1946-08-04, 69 y.o.   MRN: [id number]  HPI  Patient here for followup - numerous concerns: dry mouth and difficulty swallowing because of same. Unable to tolerate Lyrica, Cymbalta, nortriptyline or gabapentin - pending referral to [facility] for mgmt of neuropathy per neuro Unable to tolerate Victoza because of Nausea which has improved - and cbgs high due to frequent steroid injections into knees  Past Medical History  Diagnosis Date  . GERD   . Osteoarthritis   . HYPERTENSION   . IDDM (insulin dependent diabetes mellitus)   . Ulcerative colitis     Remicade infusion Q 8 weeks  . History of thrombocytopenia 12/2011  . 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 Dental bridge present     lower  . Anxiety     Review of Systems  Constitutional: Positive for fatigue and unexpected weight change.  HENT: Positive for trouble swallowing. Negative for drooling.   Respiratory: Negative for cough and shortness of breath.   Cardiovascular: Negative for leg swelling.  Musculoskeletal: Positive for myalgias, arthralgias, gait problem and neck stiffness. Joint swelling: knees, chronic.  Neurological: Positive for dizziness. Negative for facial asymmetry.       Objective:    Physical Exam  Constitutional: She appears well-developed and well-nourished. No distress.  obese  HENT:  Dry mucosa  Cardiovascular: Normal rate, regular rhythm and normal heart sounds.   No murmur heard. Pulmonary/Chest: Effort normal and breath sounds normal. No respiratory distress.  Musculoskeletal: She exhibits no edema.    BP 138/82 mmHg  Pulse 63  Temp(Src) 98.3 F (36.8 C) (Oral)  Ht 5' 1"  (1.549 m)  Wt 214 lb 8 oz (97.297 kg)  BMI 40.55 kg/m2  SpO2 98% Wt Readings from Last 3 Encounters:  02/21/15 214 lb 8 oz (97.297 kg)  12/27/14 207 lb 3.2 oz (93.985 kg)  12/27/14 207 lb 12.8 oz (94.257 kg)    Lab  Results  Component Value Date   WBC 6.9 12/01/2014   HGB 13.9 12/01/2014   HCT 39.7 12/01/2014   PLT 169 12/01/2014   GLUCOSE 147* 12/01/2014   CHOL 235* 05/18/2014   TRIG 144.0 05/18/2014   HDL 48.80 05/18/2014   LDLDIRECT 69.5 01/24/2011   LDLCALC 157* 05/18/2014   ALT 24 12/01/2014   AST 27 12/01/2014   NA 139 12/01/2014   K 4.2 12/01/2014   CL 103 12/01/2014   CREATININE 0.72 12/01/2014   BUN 14 12/01/2014   CO2 29 12/01/2014   TSH 1.400 03/04/2014   INR 1.25 01/14/2012   HGBA1C 6.8* 08/18/2014   MICROALBUR 1.1 08/18/2014    No results found.     Assessment & Plan:   Problem List Items Addressed This Visit    DM (diabetes mellitus), type 2, uncontrolled - Primary    Prev followed with endo ([facility]) for same - but now follows here only Reviewed home cbgs - range 70-200s Added NPH 11/2013 to ongoing TID aspart - no significant change Changed NPH to Levemir 01/2014 because spouse on same - improved readings, but concerned about weight gain Added Victoza 07/2014 for DM and to benefit weight reduction goals, but unable to tolerate same because of GI side effects  Pt understands we will refer back to endo (outside provider) if unsuccessful managing same here Reviewed insulin resistance and poor tolerance of many oral medications in prior trials associated with  neuropathy in feet - but intol of prior TCA, gaba and Lyrica trials -reviewed various TCAs and tx since 03/2014 per neuro  Lab Results  Component Value Date   HGBA1C 6.8* 08/18/2014        Relevant Medications   Insulin Detemir (LEVEMIR) 100 UNIT/ML Pen   Other Relevant Orders   Hemoglobin B9J   Basic metabolic panel   Microalbumin / creatinine urine ratio   Myalgia   Relevant Orders   Ambulatory referral to Rheumatology   Polyarthralgia   Relevant Orders   Ambulatory referral to Rheumatology   Ulcerative colitis    Follows with GI for same - Chronic diarrhea -uses over-the-counter Imodium 3 times a day  plus when necessary for same Clinically in remission per GI note spring 2014, on Remicade q8wk Interval history reviewed, continue same  Will refer to rheumatology for review of tx as related to potential other autoimmune diseases      Relevant Orders   Ambulatory referral to Rheumatology    Other Visit Diagnoses    Dry mouth        Relevant Orders    Ambulatory referral to Rheumatology        [name], MD

## 2015-02-21 NOTE — Assessment & Plan Note (Signed)
Follows with GI for same - Chronic diarrhea -uses over-the-counter Imodium 3 times a day plus when necessary for same Clinically in remission per GI note spring 2014, on Remicade q8wk Interval history reviewed, continue same  Will refer to rheumatology for review of tx as related to potential other autoimmune diseases

## 2015-02-21 NOTE — Progress Notes (Signed)
Pre visit review using our clinic review tool, if applicable. No additional management support is needed unless otherwise documented below in the visit note. 

## 2015-02-21 NOTE — Addendum Note (Signed)
Addended by: Lowella Dandy on: 02/21/2015 11:05 AM   Modules accepted: Orders

## 2015-02-21 NOTE — Assessment & Plan Note (Signed)
Prev followed with endo ([facility]) for same - but now follows here only Reviewed home cbgs - range 70-200s Added NPH 11/2013 to ongoing TID aspart - no significant change Changed NPH to Levemir 01/2014 because spouse on same - improved readings, but concerned about weight gain Added Victoza 07/2014 for DM and to benefit weight reduction goals, but unable to tolerate same because of GI side effects  Pt understands we will refer back to endo (outside provider) if unsuccessful managing same here Reviewed insulin resistance and poor tolerance of many oral medications in prior trials associated with neuropathy in feet - but intol of prior TCA, gaba and Lyrica trials -reviewed various TCAs and tx since 03/2014 per neuro  Lab Results  Component Value Date   HGBA1C 6.8* 08/18/2014

## 2015-02-21 NOTE — Patient Instructions (Signed)
It was good to see you today.  We have reviewed your prior records including labs and tests today  Test(s) ordered today. Your results will be released to [facility] (or called to you) after review, usually within 72hours after test completion. If any changes need to be made, you will be notified at that same time.  Medications reviewed and updated, no changes recommended at this time. Refill on medication(s) as discussed today. Will consider if Innovokana right for you based on A1c values  we'll make referral to rheumatology for evaluation of possible autoimmune disease . Our office will contact you regarding appointment(s) once made.  Please schedule followup in  months, call sooner if problems.

## 2015-02-23 ENCOUNTER — Other Ambulatory Visit: Payer: Self-pay

## 2015-02-23 ENCOUNTER — Telehealth: Payer: Self-pay | Admitting: Internal Medicine

## 2015-02-23 MED ORDER — POTASSIUM CHLORIDE CRYS ER 10 MEQ PO TBCR: 20.0000 meq | EXTENDED_RELEASE_TABLET | Freq: Every day | ORAL | Status: DC

## 2015-02-23 MED ORDER — INSULIN DETEMIR 100 UNIT/ML FLEXPEN: 70.0000 [IU] | PEN_INJECTOR | Freq: Every day | SUBCUTANEOUS | Status: DC

## 2015-02-23 MED ORDER — ONETOUCH DELICA LANCETS 33G MISC: Status: DC

## 2015-02-23 MED ORDER — INSULIN PEN NEEDLE 31G X 5 MM MISC: Status: DC

## 2015-02-23 MED ORDER — "INSULIN SYRINGE-NEEDLE U-100 31G X 5/16"" 1 ML MISC": Status: DC

## 2015-02-23 MED ORDER — METOPROLOL SUCCINATE ER 100 MG PO TB24: 100.0000 mg | ORAL_TABLET | Freq: Two times a day (BID) | ORAL | Status: DC

## 2015-02-23 MED ORDER — GLUCOSE BLOOD VI STRP: 1.0000 | ORAL_STRIP | Freq: Four times a day (QID) | Status: DC

## 2015-02-23 MED ORDER — INSULIN ASPART 100 UNIT/ML FLEXPEN: 130.0000 [IU] | PEN_INJECTOR | Freq: Three times a day (TID) | SUBCUTANEOUS | Status: DC

## 2015-02-23 MED ORDER — "NEEDLE (DISP) 27G X 5/8"" MISC": Status: DC

## 2015-02-23 NOTE — Telephone Encounter (Signed)
Pt called in and said that all the meds that was sent in on 5/24 should have been sent to [facility] and not [facility]

## 2015-02-23 NOTE — Telephone Encounter (Signed)
erx done and sent to [facility].

## 2015-02-24 ENCOUNTER — Telehealth: Payer: Self-pay | Admitting: Internal Medicine

## 2015-02-24 NOTE — Telephone Encounter (Signed)
Pt called and would like to speak to nurse about her lab results

## 2015-02-28 ENCOUNTER — Encounter (HOSPITAL_COMMUNITY)
Admission: RE | Admit: 2015-02-28 | Discharge: 2015-02-28 | Disposition: A | Discharge status: Home / Self Care | Payer: Medicare Other | Source: Ambulatory Visit | Attending: Internal Medicine | Admitting: Internal Medicine

## 2015-02-28 ENCOUNTER — Telehealth: Payer: Self-pay | Admitting: Internal Medicine

## 2015-02-28 DIAGNOSIS — K5190 Ulcerative colitis, unspecified, without complications: Secondary | ICD-10-CM | POA: Insufficient documentation

## 2015-02-28 MED ORDER — ACETAMINOPHEN 325 MG PO TABS
ORAL_TABLET | ORAL | Status: AC
  Filled 2015-02-28: qty 2

## 2015-02-28 MED ORDER — ACETAMINOPHEN 325 MG PO TABS
650.0000 mg | ORAL_TABLET | Freq: Once | ORAL | Status: AC
  Administered 2015-02-28: 650 mg via ORAL

## 2015-02-28 MED ORDER — SODIUM CHLORIDE 0.9 % IV SOLN
5.0000 mg/kg | INTRAVENOUS | Status: DC
  Administered 2015-02-28: 500 mg via INTRAVENOUS
  Filled 2015-02-28: qty 50

## 2015-02-28 MED ORDER — LORATADINE 10 MG PO TABS
10.0000 mg | ORAL_TABLET | Freq: Once | ORAL | Status: AC
  Administered 2015-02-28: 10 mg via ORAL

## 2015-02-28 MED ORDER — LORATADINE 10 MG PO TABS
ORAL_TABLET | ORAL | Status: AC
  Filled 2015-02-28: qty 1

## 2015-02-28 MED ORDER — SODIUM CHLORIDE 0.9 % IV SOLN
Freq: Once | INTRAVENOUS | Status: AC
  Administered 2015-02-28: 12:00:00 via INTRAVENOUS

## 2015-02-28 MED ORDER — INSULIN PEN NEEDLE 31G X 6 MM MISC: Status: DC

## 2015-02-28 NOTE — Telephone Encounter (Signed)
Tried to call but vm box is not set up.

## 2015-02-28 NOTE — Telephone Encounter (Signed)
[facility]oke to [facility] and have reentered the needle needed and verified the dosage of levemir.

## 2015-02-28 NOTE — Telephone Encounter (Signed)
[facility] is calling for clarity on Insulin Detemir (LEVEMIR) 100 UNIT/ML Pen [[id number]] . She shows 1/2 the dosage of the previous script. She also has questions on Insulin Syringe-Needle U-100 31G X 5/16" 1 ML MISC [[id number]] . She states that there is an active script for the flex pen as well. Please call her @ [phone]   ref # [id number]

## 2015-02-28 NOTE — Discharge Instructions (Signed)
Infliximab injection What is this medicine? INFLIXIMAB (in [place] i mab) is used to treat Crohn's disease and ulcerative colitis. It is also used to treat ankylosing spondylitis, psoriasis, and some forms of arthritis. This medicine may be used for other purposes; ask your health care provider or pharmacist if you have questions. COMMON BRAND NAME(S): Remicade What should I tell my health care provider before I take this medicine? They need to know if you have any of these conditions: -diabetes -exposure to tuberculosis -heart failure -hepatitis or liver disease -immune system problems -infection -lung or breathing disease, like COPD -multiple sclerosis -current or past resident of [place] or [place] -seizure disorder -an unusual or allergic reaction to infliximab, mouse proteins, other medicines, foods, dyes, or preservatives -pregnant or trying to get pregnant -breast-feeding How should I use this medicine? This medicine is for injection into a vein. It is usually given by a health care professional in a hospital or clinic setting. A special MedGuide will be given to you by the pharmacist with each prescription and refill. Be sure to read this information carefully each time. Talk to your pediatrician regarding the use of this medicine in children. Special care may be needed. Overdosage: If you think you have taken too much of this medicine contact a poison control center or emergency room at once. NOTE: This medicine is only for you. Do not share this medicine with others. What if I miss a dose? It is important not to miss your dose. Call your doctor or health care professional if you are unable to keep an appointment. What may interact with this medicine? Do not take this medicine with any of the following medications: -anakinra -rilonacept This medicine may also interact with the following medications: -vaccines This list may not describe all possible interactions.  Give your health care provider a list of all the medicines, herbs, non-prescription drugs, or dietary supplements you use. Also tell them if you smoke, drink alcohol, or use illegal drugs. Some items may interact with your medicine. What should I watch for while using this medicine? Visit your doctor or health care professional for regular checks on your progress. If you get a cold or other infection while receiving this medicine, call your doctor or health care professional. Do not treat yourself. This medicine may decrease your body's ability to fight infections. Before beginning therapy, your doctor may do a test to see if you have been exposed to tuberculosis. This medicine may make the symptoms of heart failure worse in some patients. If you notice symptoms such as increased shortness of breath or swelling of the ankles or legs, contact your health care provider right away. If you are going to have surgery or dental work, tell your health care professional or dentist that you have received this medicine. If you take this medicine for plaque psoriasis, stay out of the sun. If you cannot avoid being in the sun, wear protective clothing and use sunscreen. Do not use sun lamps or tanning beds/booths. What side effects may I notice from receiving this medicine? Side effects that you should report to your doctor or health care professional as soon as possible: -allergic reactions like skin rash, itching or hives, swelling of the face, lips, or tongue -chest pain -fever or chills, usually related to the infusion -muscle or joint pain -red, scaly patches or raised bumps on the skin -signs of infection - fever or chills, cough, sore throat, pain or difficulty passing urine -swollen lymph nodes  in the neck, underarm, or groin areas -unexplained weight loss -unusual bleeding or bruising -unusually weak or tired -yellowing of the eyes or skin Side effects that usually do not require medical attention  (report to your doctor or health care professional if they continue or are bothersome): -headache -heartburn or stomach pain -nausea, vomiting This list may not describe all possible side effects. Call your doctor for medical advice about side effects. You may report side effects to FDA at 1-800-FDA-1088. Where should I keep my medicine? This drug is given in a hospital or clinic and will not be stored at home. NOTE: This sheet is a summary. It may not cover all possible information. If you have questions about this medicine, talk to your doctor, pharmacist, or health care provider.  2015, Elsevier/Gold Standard. (2008-05-04 10:26:02)

## 2015-03-06 ENCOUNTER — Telehealth: Payer: Self-pay | Admitting: Internal Medicine

## 2015-03-06 NOTE — Telephone Encounter (Signed)
Patient called in asking about the referral to a rheumatologist, so I told her the referral was sent in today and the doctors name. She had said Dr Asa Lente mentioned a Dr. Ouida Sills. Just letting you know.

## 2015-03-07 NOTE — Telephone Encounter (Signed)
Pt is scheduled. See referral.

## 2015-03-22 DIAGNOSIS — M25561 Pain in right knee: Secondary | ICD-10-CM | POA: Diagnosis not present

## 2015-03-22 DIAGNOSIS — M25562 Pain in left knee: Secondary | ICD-10-CM | POA: Diagnosis not present

## 2015-03-22 DIAGNOSIS — E119 Type 2 diabetes mellitus without complications: Secondary | ICD-10-CM | POA: Diagnosis not present

## 2015-03-22 DIAGNOSIS — K5190 Ulcerative colitis, unspecified, without complications: Secondary | ICD-10-CM | POA: Diagnosis not present

## 2015-03-22 DIAGNOSIS — M2550 Pain in unspecified joint: Secondary | ICD-10-CM | POA: Diagnosis not present

## 2015-03-22 DIAGNOSIS — M79643 Pain in unspecified hand: Secondary | ICD-10-CM | POA: Diagnosis not present

## 2015-03-22 DIAGNOSIS — F5101 Primary insomnia: Secondary | ICD-10-CM | POA: Diagnosis not present

## 2015-03-22 DIAGNOSIS — G629 Polyneuropathy, unspecified: Secondary | ICD-10-CM | POA: Diagnosis not present

## 2015-03-30 ENCOUNTER — Ambulatory Visit (INDEPENDENT_AMBULATORY_CARE_PROVIDER_SITE_OTHER): Payer: Medicare Other | Admitting: Internal Medicine

## 2015-03-30 ENCOUNTER — Encounter: Payer: Self-pay | Admitting: Internal Medicine

## 2015-03-30 VITALS — BP 120/78 | HR 63 | Temp 97.8°F | Ht 61.0 in | Wt 211.5 lb

## 2015-03-30 DIAGNOSIS — I10 Essential (primary) hypertension: Secondary | ICD-10-CM

## 2015-03-30 DIAGNOSIS — K5190 Ulcerative colitis, unspecified, without complications: Secondary | ICD-10-CM

## 2015-03-30 DIAGNOSIS — G629 Polyneuropathy, unspecified: Secondary | ICD-10-CM

## 2015-03-30 MED ORDER — DIVALPROEX SODIUM 250 MG PO DR TAB: 250.0000 mg | DELAYED_RELEASE_TABLET | Freq: Two times a day (BID) | ORAL | Status: DC

## 2015-03-30 NOTE — Assessment & Plan Note (Signed)
The current medical regimen is effective;  continue present plan and medications. BP Readings from Last 3 Encounters:  03/30/15 120/78  02/28/15 167/71  02/21/15 138/82

## 2015-03-30 NOTE — Progress Notes (Signed)
Pre visit review using our clinic review tool, if applicable. No additional management support is needed unless otherwise documented below in the visit note. 

## 2015-03-30 NOTE — Progress Notes (Signed)
Subjective:    Patient ID: [name], female    DOB: May 24, 1946, 69 y.o.   MRN: [id number]  HPI  Patient here for continued concerns with tingling symptoms in her hands and feet Extreme dry mouth and dry cough Poor sleep due to pins and needles in lower legs Feet feel "ice cold" to pt perception but spouse reports normal to touch  Past Medical History  Diagnosis Date  . GERD   . Osteoarthritis   . HYPERTENSION   . IDDM (insulin dependent diabetes mellitus)   . Ulcerative colitis     Remicade infusion Q 8 weeks  . History of thrombocytopenia 12/2011  . 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 Dental bridge present     lower  . Anxiety     Review of Systems  Constitutional: Negative for fever and appetite change.  HENT:       Dry mouth  Musculoskeletal: Positive for myalgias and arthralgias.  Neurological: Positive for numbness.  Psychiatric/Behavioral: Positive for sleep disturbance.       Objective:    Physical Exam  Constitutional: She appears well-developed and well-nourished. No distress.  MO  Cardiovascular: Normal rate, regular rhythm and normal heart sounds.   No murmur heard. Pulmonary/Chest: Effort normal and breath sounds normal. No respiratory distress.  Musculoskeletal: She exhibits no edema.  Skin: Skin is warm and dry. No rash noted. No erythema.    BP 120/78 mmHg  Pulse 63  Temp(Src) 97.8 F (36.6 C) (Oral)  Ht 5' 1"  (1.549 m)  Wt 211 lb 8 oz (95.936 kg)  BMI 39.98 kg/m2  SpO2 93% Wt Readings from Last 3 Encounters:  03/30/15 211 lb 8 oz (95.936 kg)  02/28/15 213 lb (96.616 kg)  02/21/15 214 lb 8 oz (97.297 kg)    Lab Results  Component Value Date   WBC 6.9 12/01/2014   HGB 13.9 12/01/2014   HCT 39.7 12/01/2014   PLT 169 12/01/2014   GLUCOSE 214* 02/21/2015   CHOL 235* 05/18/2014   TRIG 144.0 05/18/2014   HDL 48.80 05/18/2014   LDLDIRECT 69.5 01/24/2011   LDLCALC 157* 05/18/2014   ALT 24 12/01/2014   AST 27 12/01/2014   NA 137 02/21/2015   K 4.3 02/21/2015   CL 102 02/21/2015   CREATININE 0.62 02/21/2015   BUN 13 02/21/2015   CO2 31 02/21/2015   TSH 1.400 03/04/2014   INR 1.25 01/14/2012   HGBA1C 6.5 02/21/2015   MICROALBUR <0.7 02/21/2015    No results found.     Assessment & Plan:   Problem List Items Addressed This Visit    Small fiber neuropathy - Primary    Long hx same - affects hands and feet Consult by neuro Posey Pronto) reviewed 2016:Small fiber neuropathy -  Multiple medication trials for remediation of same with poor tolerance to all including amitriptyline, nortriptyline, gabapentin, Cymbalta & Lyrica Also evaluated by rheumatology June 2016 ([place]): No evidence for autoimmune or secondary rheumatologic process contributing to nonspecific symptoms At suggestion of neurology per note March 2016, will initiate Depakote therapy for symptom alleviation we reviewed potential risk/benefit and possible side effects - pt understands and agrees to same  Workup review, education & reassurance provided      Ulcerative colitis    Follows with GI for same - Chronic diarrhea -uses over-the-counter Imodium 3 times a day plus when necessary for same Clinically in remission per GI note spring 2014, maintained on Remicade q8wk Interval  history reviewed, continue same  Per June 2016 rheumatology review: No evidence for secondary or other autoimmune diseases          [name], MD

## 2015-03-30 NOTE — Patient Instructions (Signed)
It was good to see you today.  We have reviewed your prior records including labs and tests today  Test(s) ordered today. Your results will be released to [facility] (or called to you) after review, usually within 72hours after test completion. If any changes need to be made, you will be notified at that same time.  Medications reviewed and updated Begin Depakote at low dose for efforts to control numbness pain No other changes recommended at this time.  Your prescription(s) have been submitted to your pharmacy. Please take as directed and contact our office if you believe you are having problem(s) with the medication(s).  Please keep scheduled followup in 3-4 months, call sooner if problems.

## 2015-03-30 NOTE — Assessment & Plan Note (Signed)
Long hx same - affects hands and feet Consult by neuro Posey Pronto) reviewed 2016:Small fiber neuropathy -  Multiple medication trials for remediation of same with poor tolerance to all including amitriptyline, nortriptyline, gabapentin, Cymbalta & Lyrica Also evaluated by rheumatology June 2016 ([place]): No evidence for autoimmune or secondary rheumatologic process contributing to nonspecific symptoms At suggestion of neurology per note March 2016, will initiate Depakote therapy for symptom alleviation we reviewed potential risk/benefit and possible side effects - pt understands and agrees to same  Workup review, education & reassurance provided

## 2015-03-30 NOTE — Assessment & Plan Note (Signed)
Follows with GI for same - Chronic diarrhea -uses over-the-counter Imodium 3 times a day plus when necessary for same Clinically in remission per GI note spring 2014, maintained on Remicade q8wk Interval history reviewed, continue same  Per June 2016 rheumatology review: No evidence for secondary or other autoimmune diseases

## 2015-04-05 ENCOUNTER — Telehealth: Payer: Self-pay | Admitting: Internal Medicine

## 2015-04-05 NOTE — Telephone Encounter (Signed)
Patient new medication divalproex is working very little and still having the tingling in hands. She is needing something for general aches and pains. She doesn't like taking OTC because she feels like it only works a short while and its not that good for you to take too many. Pharmacy is [facility] on [address] Please advise patient

## 2015-04-06 NOTE — Telephone Encounter (Signed)
Request routed to pcp due complications pt has with medications.

## 2015-04-06 NOTE — Telephone Encounter (Signed)
Options for patient - we can increase dose of depakote to see if higher dose is beneficial Or we can stop depakote  (period. Nothing else recommended to replace depakote. I have NO other med options to try for this chronic tingling pain issue. pt and I discussed same at our last OV)  Let me know her preference thanks

## 2015-04-07 NOTE — Telephone Encounter (Signed)
Pt stated that she has had the best results with cymbalta.   Pt requested for you and Dr. Laural [name] to consult regarding best option.

## 2015-04-10 NOTE — Telephone Encounter (Signed)
Unless Depakote is making symptoms worse, I suggest pt continue on same low dose prescribed. If still no improvement after 2 weeks of low dose depakote, I will add low dose cymbalta to her ongoing meds (potentailly 7/14 this week if pt has been continuing depakote since rx'd 6/30) thanks

## 2015-04-10 NOTE — Telephone Encounter (Signed)
LVM for pt to call back as soon as possible.   RE: MD

## 2015-04-11 NOTE — Telephone Encounter (Signed)
Patient called in. Read dr Maryclare Labrador note below. She understood and stated no need for follow up at this time

## 2015-04-17 ENCOUNTER — Telehealth (INDEPENDENT_AMBULATORY_CARE_PROVIDER_SITE_OTHER): Payer: Self-pay | Admitting: *Deleted

## 2015-04-17 NOTE — Telephone Encounter (Signed)
Aracelia would like to speak with [name] about the authorization/approval for the last Remicade. Her return phone number is  [phone].

## 2015-04-18 NOTE — Telephone Encounter (Signed)
Special's symptoms are getting worse and the hospital said the authorization has not been received. Hands and feet are swollen, num, hurt and can not make a fist. She is also having lots gas, bloating and smell.

## 2015-04-20 NOTE — Telephone Encounter (Signed)
I have talked with the patient . She states that her condition is worse. Hands and feet are swollen , num, hurt. She states that she cannot make a fist, hands and feet are raw looking. She questions if the medication she is given by her PCP for other health issues are causing this or is it her UC. She mentions that the gas she has is horrible and such a foul odor. She has tried [employer] and nothing has worked. Patient is allergic to Flagyl.  Not sure if Remicade is helping , afterwards she is so tired she is not sure if symptoms are at all improved. States that she will let my know after infusion on 04/25/15.  Insurance is sending additional paper work over to be completed along with clinicals to be sent. Should have an answer by 04/24/15.

## 2015-04-24 ENCOUNTER — Other Ambulatory Visit: Payer: Self-pay | Admitting: Neurology

## 2015-04-24 NOTE — Telephone Encounter (Signed)
Rx sent 

## 2015-04-25 ENCOUNTER — Encounter (HOSPITAL_COMMUNITY)
Admission: RE | Admit: 2015-04-25 | Discharge: 2015-04-25 | Disposition: A | Discharge status: Home / Self Care | Payer: Medicare Other | Source: Ambulatory Visit | Attending: Internal Medicine | Admitting: Internal Medicine

## 2015-04-25 DIAGNOSIS — K5190 Ulcerative colitis, unspecified, without complications: Secondary | ICD-10-CM | POA: Diagnosis not present

## 2015-04-25 MED ORDER — LORATADINE 10 MG PO TABS
10.0000 mg | ORAL_TABLET | Freq: Once | ORAL | Status: AC
  Administered 2015-04-25: 10 mg via ORAL

## 2015-04-25 MED ORDER — ACETAMINOPHEN 325 MG PO TABS
650.0000 mg | ORAL_TABLET | Freq: Once | ORAL | Status: AC
  Administered 2015-04-25: 650 mg via ORAL

## 2015-04-25 MED ORDER — SODIUM CHLORIDE 0.9 % IV SOLN
Freq: Once | INTRAVENOUS | Status: AC
Start: 2015-04-25 — End: 2015-04-25
  Administered 2015-04-25: 250 mL via INTRAVENOUS

## 2015-04-25 MED ORDER — SODIUM CHLORIDE 0.9 % IV SOLN
5.0000 mg/kg | Freq: Once | INTRAVENOUS | Status: AC
  Administered 2015-04-25: 500 mg via INTRAVENOUS
  Filled 2015-04-25: qty 50

## 2015-04-25 MED ORDER — ACETAMINOPHEN 325 MG PO TABS
ORAL_TABLET | ORAL | Status: AC
  Filled 2015-04-25: qty 2

## 2015-04-25 MED ORDER — LORATADINE 10 MG PO TABS
ORAL_TABLET | ORAL | Status: AC
  Filled 2015-04-25: qty 1

## 2015-04-25 NOTE — Progress Notes (Signed)
Here for remicade infusion. Dx ulcerative colitis.

## 2015-05-11 ENCOUNTER — Other Ambulatory Visit: Payer: Self-pay | Admitting: Internal Medicine

## 2015-05-15 ENCOUNTER — Telehealth: Payer: Self-pay | Admitting: *Deleted

## 2015-05-15 MED ORDER — NEXIUM 40 MG PO CPDR: 40.0000 mg | DELAYED_RELEASE_CAPSULE | Freq: Two times a day (BID) | ORAL | Status: DC

## 2015-05-15 NOTE — Telephone Encounter (Signed)
[facility] is [facility]. Inform pt will send script...[name]

## 2015-05-26 ENCOUNTER — Telehealth: Payer: Self-pay | Admitting: Obstetrics & Gynecology

## 2015-05-26 NOTE — Telephone Encounter (Signed)
Left message on voicemail to call and reschedule cancelled appointment. °

## 2015-06-08 ENCOUNTER — Telehealth: Payer: Self-pay | Admitting: *Deleted

## 2015-06-08 NOTE — Telephone Encounter (Signed)
Recall Notes:  Pt due for bilateral diagnostic mammogram to verify stability of benign calcifications in right breast.  Last MMG:  03/16/14  IMPRESSION: 1. Unchanged probably benign calcifications in the inner right breast.  2. No mammographic evidence of malignancy in either breast.  RECOMMENDATION: Bilateral diagnostic mammography in 12 months with magnification views of the right breast, which will demonstrate 2 years of stability of the probably benign right breast calcifications.  Pt overdue for mammogram recall.  Due June 2016.  Follow up appointment has not been made.  Please call patient to schedule.  [facility].

## 2015-06-09 DIAGNOSIS — H2513 Age-related nuclear cataract, bilateral: Secondary | ICD-10-CM | POA: Diagnosis not present

## 2015-06-09 DIAGNOSIS — E119 Type 2 diabetes mellitus without complications: Secondary | ICD-10-CM | POA: Diagnosis not present

## 2015-06-09 DIAGNOSIS — H10413 Chronic giant papillary conjunctivitis, bilateral: Secondary | ICD-10-CM | POA: Diagnosis not present

## 2015-06-12 NOTE — Telephone Encounter (Signed)
Lmtcb//kn 

## 2015-06-12 NOTE — Telephone Encounter (Signed)
Return call to [place].

## 2015-06-19 ENCOUNTER — Telehealth: Payer: Self-pay | Admitting: Internal Medicine

## 2015-06-19 MED ORDER — OMEPRAZOLE 40 MG PO CPDR: 40.0000 mg | DELAYED_RELEASE_CAPSULE | Freq: Every day | ORAL | Status: DC

## 2015-06-19 NOTE — Telephone Encounter (Signed)
Patient stated that the nexium sent in is name brand, and far too expensive. She requests that we send the generic [facility].

## 2015-06-19 NOTE — Telephone Encounter (Signed)
erx done

## 2015-06-20 ENCOUNTER — Encounter (HOSPITAL_COMMUNITY)
Admission: RE | Admit: 2015-06-20 | Discharge: 2015-06-20 | Disposition: A | Discharge status: Home / Self Care | Payer: Medicare Other | Source: Ambulatory Visit | Attending: Internal Medicine | Admitting: Internal Medicine

## 2015-06-20 DIAGNOSIS — K5190 Ulcerative colitis, unspecified, without complications: Secondary | ICD-10-CM | POA: Diagnosis not present

## 2015-06-20 MED ORDER — SODIUM CHLORIDE 0.9 % IV SOLN
Freq: Once | INTRAVENOUS | Status: AC
  Administered 2015-06-20: 250 mL via INTRAVENOUS

## 2015-06-20 MED ORDER — SODIUM CHLORIDE 0.9 % IV SOLN
500.0000 mg | Freq: Once | INTRAVENOUS | Status: AC
  Administered 2015-06-20: 500 mg via INTRAVENOUS
  Filled 2015-06-20: qty 50

## 2015-06-22 ENCOUNTER — Other Ambulatory Visit (HOSPITAL_COMMUNITY): Payer: Self-pay | Admitting: *Deleted

## 2015-06-22 DIAGNOSIS — Z09 Encounter for follow-up examination after completed treatment for conditions other than malignant neoplasm: Secondary | ICD-10-CM

## 2015-06-27 ENCOUNTER — Ambulatory Visit: Payer: BC Managed Care – PPO | Admitting: Neurology

## 2015-06-27 ENCOUNTER — Other Ambulatory Visit: Payer: Self-pay | Admitting: Internal Medicine

## 2015-06-27 NOTE — Telephone Encounter (Signed)
Pt called in and said [facility] is telling her that the name brand was called in .  Can you call pharmacy and see which one was called in ?     Best number is [phone]

## 2015-06-28 MED ORDER — OMEPRAZOLE 40 MG PO CPDR: 40.0000 mg | DELAYED_RELEASE_CAPSULE | Freq: Every day | ORAL | Status: DC

## 2015-06-28 NOTE — Telephone Encounter (Signed)
erx resent

## 2015-06-28 NOTE — Addendum Note (Signed)
Addended by: Lowella Dandy on: 06/28/2015 08:08 AM   Modules accepted: Orders

## 2015-07-03 ENCOUNTER — Ambulatory Visit (HOSPITAL_COMMUNITY): Payer: BC Managed Care – PPO | Admitting: Oncology

## 2015-07-03 ENCOUNTER — Other Ambulatory Visit (HOSPITAL_COMMUNITY): Payer: BC Managed Care – PPO

## 2015-07-04 ENCOUNTER — Ambulatory Visit (HOSPITAL_COMMUNITY)
Admission: RE | Admit: 2015-07-04 | Discharge: 2015-07-04 | Disposition: A | Discharge status: Home / Self Care | Payer: Medicare Other | Source: Ambulatory Visit | Attending: Physical Medicine and Rehabilitation | Admitting: Physical Medicine and Rehabilitation

## 2015-07-04 ENCOUNTER — Encounter (HOSPITAL_COMMUNITY): Payer: Medicare Other

## 2015-07-04 ENCOUNTER — Encounter (HOSPITAL_BASED_OUTPATIENT_CLINIC_OR_DEPARTMENT_OTHER): Payer: Medicare Other

## 2015-07-04 ENCOUNTER — Encounter (HOSPITAL_COMMUNITY): Payer: Medicare Other | Attending: Internal Medicine | Admitting: Oncology

## 2015-07-04 ENCOUNTER — Ambulatory Visit: Payer: Medicare Other | Admitting: Obstetrics & Gynecology

## 2015-07-04 VITALS — BP 137/83 | HR 78 | Temp 98.2°F | Resp 18 | Wt 208.5 lb

## 2015-07-04 DIAGNOSIS — Z09 Encounter for follow-up examination after completed treatment for conditions other than malignant neoplasm: Secondary | ICD-10-CM

## 2015-07-04 DIAGNOSIS — D72820 Lymphocytosis (symptomatic): Secondary | ICD-10-CM | POA: Insufficient documentation

## 2015-07-04 DIAGNOSIS — R921 Mammographic calcification found on diagnostic imaging of breast: Secondary | ICD-10-CM | POA: Diagnosis not present

## 2015-07-04 DIAGNOSIS — K5190 Ulcerative colitis, unspecified, without complications: Secondary | ICD-10-CM

## 2015-07-04 DIAGNOSIS — R0609 Other forms of dyspnea: Secondary | ICD-10-CM | POA: Diagnosis not present

## 2015-07-04 LAB — COMPREHENSIVE METABOLIC PANEL
ALK PHOS: 70 U/L (ref 38–126)
ALT: 26 U/L (ref 14–54)
AST: 40 U/L (ref 15–41)
Albumin: 3.5 g/dL (ref 3.5–5.0)
Anion gap: 5 (ref 5–15)
BUN: 13 mg/dL (ref 6–20)
CALCIUM: 8.7 mg/dL — AB (ref 8.9–10.3)
CHLORIDE: 105 mmol/L (ref 101–111)
CO2: 29 mmol/L (ref 22–32)
Creatinine, Ser: 0.67 mg/dL (ref 0.44–1.00)
GFR calc non Af Amer: >60 mL/min (ref >60)
Glucose, Bld: 179 mg/dL — ABNORMAL HIGH (ref 65–99)
Potassium: 3.7 mmol/L (ref 3.5–5.1)
SODIUM: 139 mmol/L (ref 135–145)
Total Bilirubin: 0.6 mg/dL (ref 0.3–1.2)
Total Protein: 7.1 g/dL (ref 6.5–8.1)

## 2015-07-04 LAB — CBC WITH DIFFERENTIAL/PLATELET
Basophils Absolute: 0 10*3/uL (ref 0.0–0.1)
Basophils Relative: 1 %
EOS ABS: 0.2 10*3/uL (ref 0.0–0.7)
EOS PCT: 2 %
HCT: 40.1 % (ref 36.0–46.0)
Hemoglobin: 14.1 g/dL (ref 12.0–15.0)
Lymphocytes Relative: 68 %
Lymphs Abs: 4.5 10*3/uL — ABNORMAL HIGH (ref 0.7–4.0)
MCH: 32.5 pg (ref 26.0–34.0)
MCHC: 35.2 g/dL (ref 30.0–36.0)
MCV: 92.4 fL (ref 78.0–100.0)
MONOS PCT: 9 %
Monocytes Absolute: 0.6 10*3/uL (ref 0.1–1.0)
Neutro Abs: 1.3 10*3/uL — ABNORMAL LOW (ref 1.7–7.7)
Neutrophils Relative %: 20 %
PLATELETS: 170 10*3/uL (ref 150–400)
RBC: 4.34 MIL/uL (ref 3.87–5.11)
RDW: 12.9 % (ref 11.5–15.5)
WBC: 6.6 10*3/uL (ref 4.0–10.5)

## 2015-07-04 NOTE — Patient Instructions (Signed)
[place] at [facility] Discharge Instructions  RECOMMENDATIONS MADE BY THE CONSULTANT AND ANY TEST RESULTS WILL BE SENT TO YOUR REFERRING PHYSICIAN.  You will have labs in one year and return for follow up in one  Year.  Please see your schedule for appointment times.    Thank you for choosing [place] at [facility] to provide your oncology and hematology care.  To afford each patient quality time with our provider, please arrive at least 15 minutes before your scheduled appointment time.    You need to re-schedule your appointment should you arrive 10 or more minutes late.  We strive to give you quality time with our providers, and arriving late affects you and other patients whose appointments are after yours.  Also, if you no show three or more times for appointments you may be dismissed from the clinic at the providers discretion.     Again, thank you for choosing [facility].  Our hope is that these requests will decrease the amount of time that you wait before being seen by our physicians.       _____________________________________________________________  Should you have questions after your visit to [facility], please contact our office at [phone] between the hours of 8:30 a.m. and 4:30 p.m.  Voicemails left after 4:30 p.m. will not be returned until the following business day.  For prescription refill requests, have your pharmacy contact our office.

## 2015-07-04 NOTE — Assessment & Plan Note (Addendum)
Lymphocytosis in the setting of UC on Remicade followed by Dr. Laural [name].  I personally reviewed and went over laboratory results with the patient.  The results are noted within this dictation.  Labs are stable.  Labs in 1 year: CBC diff, CMET  She has upcoming and regular follow-up with her primary care provider and GI specialist.  Return in 1 year for follow-up, sooner if necessary.

## 2015-07-04 NOTE — Progress Notes (Signed)
Jennifer Grant, MD [address] [facility] [address] [place] [address]  Lymphocytosis - Plan: cromolyn (OPTICROM) 4 % ophthalmic solution, NEXIUM 40 MG capsule, CBC with Differential, Comprehensive metabolic panel  CURRENT THERAPY: Observation  INTERVAL HISTORY: [name] 69 y.o. female returns for followup of lymphocytosis. She has a known history of ulcerative colitis and is on Remicade for maintenance. She follows with Dr. Laural [name].  She states her ulcerative colitis has been fairly well controlled.  She notes an increase in bloating, flatulence, and diarrhea  She notes that she has an upcoming appointment with Dr. Laural [name] this month.  She continues to deny blood in her stool.  I personally reviewed and went over laboratory results with the patient.  The results are noted within this dictation.  Labs updated today.  WBC is WNL, with a minimal neutropenia and minimal lymphocytosis which is stable over the years.  On further questioning, she notes exertional dyspnea, without chest pain or diaphoresis.  Her weight is stable over the past 6 months.   Past Medical History  Diagnosis Date  . GERD   . Osteoarthritis   . HYPERTENSION   . IDDM (insulin dependent diabetes mellitus)   . Ulcerative colitis     Remicade infusion Q 8 weeks  . History of thrombocytopenia 12/2011  . 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 Dental bridge present     lower  . Anxiety     has DM (diabetes mellitus), type 2, uncontrolled ([place]); Essential hypertension; GERD; Ulcerative colitis ([place]); MENOPAUSAL DISORDER; PALPITATIONS, RECURRENT; Dyslipidemia; Small fiber neuropathy ([place]); Anxiety; Peripheral neuropathy ([place]); Insomnia; Elevated LFTs; Myalgia; Polyarthralgia; Allergic sinusitis; Obese; Fatty liver; and Lymphocytosis on her problem list.     is allergic to actos; benzocaine-menthol; colesevelam; flagyl; metformin and related; omeprazole; shrimp;  statins; desipramine hcl; hydromorphone; adhesive; nisoldipine; and percocet.  Current Outpatient Prescriptions on File Prior to Visit  Medication Sig Dispense Refill  . azelastine (ASTELIN) 0.1 % nasal spray Place 2 sprays into both nostrils 2 (two) times daily. 30 mL 1  . B-D UF III MINI PEN NEEDLES 31G X 5 MM MISC     . DULoxetine (CYMBALTA) 60 MG capsule     . furosemide (LASIX) 40 MG tablet Take 1 tablet (40 mg total) by mouth daily. 90 tablet 1  . glucose blood (ONE TOUCH ULTRA TEST) test strip 1 each by Other route 4 (four) times daily. Use to check blood sugars four times a day Dx E11.65 360 each 3  . inFLIXimab (REMICADE) 100 MG injection Inject into the vein every 8 (eight) weeks.     . insulin aspart (NOVOLOG) 100 UNIT/ML FlexPen Inject 130 Units into the skin 3 (three) times daily with meals. 40 pen 2  . Insulin Detemir (LEVEMIR) 100 UNIT/ML Pen Inject 70 Units into the skin at bedtime. 130 mL 3  . Insulin Pen Needle 31G X 6 MM MISC Use 1 needle four times a day to inject daily insulin dose. 360 each 3  . lactase (LACTAID) 3000 UNITS tablet Take 3,000 Units by mouth 3 (three) times daily with meals.    Marland Kitchen loperamide (IMODIUM) 2 MG capsule Take 2 mg by mouth 3 (three) times daily as needed.     . meclizine (ANTIVERT) 25 MG tablet Take 1 tablet (25 mg total) by mouth 3 (three) times daily as needed for dizziness or nausea. 90 tablet 0  .  metoprolol succinate (TOPROL-XL) 100 MG 24 hr tablet Take 1 tablet (100 mg total) by mouth 2 (two) times daily. 180 tablet 3  . Needle, Disp, (BD SAFETYGLIDE NEEDLE) 27G X 5/8" MISC Use to dispense insulin at bedtime Dx E11.9 90 each 3  . ONETOUCH DELICA LANCETS 16B MISC PATIENT USES ONE TOUCH DELICAL 84Y LANCETS. USE AS DIRECTED THREE DAILY' DIAGNOSIS CODE E11.09 300 each 2  . potassium chloride (KLOR-CON M10) 10 MEQ tablet Take 2 tablets (20 mEq total) by mouth daily. 180 tablet 3  . Saccharomyces boulardii (FLORASTOR PO) Take by mouth daily.    .  divalproex (DEPAKOTE) 250 MG DR tablet Take 1 tablet (250 mg total) by mouth 2 (two) times daily. (Patient not taking: Reported on 07/04/2015) 60 tablet 3  . DULoxetine (CYMBALTA) 30 MG capsule TAKE 1 CAPSULE (30 MG TOTAL) BY MOUTH DAILY. (Patient not taking: Reported on 07/04/2015) 30 capsule 3  . lidocaine (XYLOCAINE) 5 % ointment Apply to hands and feet twice daily. (Patient not taking: Reported on 07/04/2015) 50 g 3  . [DISCONTINUED] amitriptyline (ELAVIL) 25 MG tablet Take 1 tablet (25 mg total) by mouth at bedtime. start with 1/2 tab at night for 1 week then increase to 1 whole tab. 30 tablet 3  . [DISCONTINUED] bromocriptine (PARLODEL) 2.5 MG tablet Take 2.5 mg by mouth 2 (two) times daily.      . [DISCONTINUED] potassium chloride (K-DUR) 10 MEQ tablet Take 2 tablets (20 mEq total) by mouth daily. 90 tablet 1   No current facility-administered medications on file prior to visit.    Past Surgical History  Procedure Laterality Date  . Abdominal hysterectomy  1980    partial  . Breast reduction surgery  1994  . Rectocele repair  1990; 09/12/2006  . Carpal tunnel release Right 1996  . Knee arthroscopy Right 01/1999; 10/2000  . Hemilaminotomy lumbar spine Bilateral 09/07/1999    L4-5  . Cholecystectomy    . Flexible sigmoidoscopy  01/17/2012    Procedure: FLEXIBLE SIGMOIDOSCOPY;  Surgeon: Rogene Houston, MD;  [facility];  Service: Endoscopy;  Laterality: N/A;  . Bilateral salpingoophorectomy  02/10/2001  . Tarsal tunnel release  2002  . Ureterolysis Right 02/10/2001  . Lysis of adhesion  02/10/2001  . Carpal tunnel release Left 03/21/2003  . 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 Esophagogastroduodenoscopy (egd) with esophageal dilation  12/02/2005  . Carpal tunnel release Right 05/04/2013    Procedure: CARPAL TUNNEL RELEASE;  Surgeon: Cammie Sickle., MD;  Location: [place];  Service: Orthopedics;  Laterality: Right;  . Carpal tunnel  release Left 09/21/2013    Procedure: LEFT CARPAL TUNNEL RELEASE;  Surgeon: Cammie Sickle., MD;  Location: [place] [place];  Service: Orthopedics;  Laterality: Left;    Denies any headaches, dizziness, double vision, fevers, chills, night sweats, nausea, vomiting, chest pain, heart palpitations, shortness of breath, blood in stool, black tarry stool, urinary pain, urinary burning, urinary frequency, hematuria.   PHYSICAL EXAMINATION  ECOG PERFORMANCE STATUS: 1 - Symptomatic but completely ambulatory  Filed Vitals:   07/04/15 1333  BP: 137/83  Pulse: 78  Temp: 98.2 F (36.8 C)  Resp: 18    GENERAL:alert, no distress, comfortable, cooperative, obese and smiling SKIN: skin color, texture, turgor are normal, no rashes or significant lesions HEAD: Normocephalic, No masses, lesions, tenderness or abnormalities EYES: normal, PERRLA, EOMI, Conjunctiva are pink and non-injected EARS: External ears  normal OROPHARYNX:lips, buccal mucosa, and tongue normal and mucous membranes are moist  NECK: supple, no adenopathy, thyroid normal size, non-tender, without nodularity, no stridor, non-tender, trachea midline LYMPH:  no palpable lymphadenopathy BREAST:not examined LUNGS: clear to auscultation  HEART: regular rate & rhythm, no murmurs and no gallops ABDOMEN:abdomen soft, obese and normal bowel sounds BACK: Back symmetric, no curvature., No CVA tenderness EXTREMITIES:less then 2 second capillary refill, no joint deformities, effusion, or inflammation, no skin discoloration, no cyanosis  NEURO: alert & oriented x 3 with fluent speech, no focal motor/sensory deficits, gait normal   LABORATORY DATA: CBC    Component Value Date/Time   WBC 6.6 07/04/2015 1327   RBC 4.34 07/04/2015 1327   HGB 14.1 07/04/2015 1327   HCT 40.1 07/04/2015 1327   PLT 170 07/04/2015 1327   MCV 92.4 07/04/2015 1327   MCH 32.5 07/04/2015 1327   MCHC 35.2 07/04/2015 1327   RDW 12.9 07/04/2015 1327    LYMPHSABS 4.5* 07/04/2015 1327   MONOABS 0.6 07/04/2015 1327   EOSABS 0.2 07/04/2015 1327   BASOSABS 0.0 07/04/2015 1327      Chemistry      Component Value Date/Time   NA 139 07/04/2015 1327   K 3.7 07/04/2015 1327   CL 105 07/04/2015 1327   CO2 29 07/04/2015 1327   BUN 13 07/04/2015 1327   CREATININE 0.67 07/04/2015 1327   CREATININE 0.81 05/31/2014 1626      Component Value Date/Time   CALCIUM 8.7* 07/04/2015 1327   ALKPHOS 70 07/04/2015 1327   AST 40 07/04/2015 1327   ALT 26 07/04/2015 1327   BILITOT 0.6 07/04/2015 1327        PENDING LABS:   RADIOGRAPHIC STUDIES:  No results found.   PATHOLOGY:    ASSESSMENT AND PLAN:  Lymphocytosis Lymphocytosis in the setting of UC on Remicade followed by Dr. Laural [name].  I personally reviewed and went over laboratory results with the patient.  The results are noted within this dictation.  Labs are stable.  Labs in 1 year: CBC diff, CMET  She has upcoming and regular follow-up with her primary care provider and GI specialist.  Return in 1 year for follow-up, sooner if necessary.   THERAPY PLAN:  CLL work-up has been negative from a hematology standpoint.  She has undergone rheumatology work-up and neurology work-up.  No further work-up required from a hematology standpoint.  Her counts have been stable and therefore, annual labs for follow-up are reasonable.  Patient knows to call us if needed to be seen sooner than planned.  All questions were answered. The patient knows to call the clinic with any problems, questions or concerns. We can certainly see the patient much sooner if necessary.  Patient and plan discussed with Dr. Ancil Linsey and she is in agreement with the aforementioned.   This note is electronically signed by: Robynn Pane, PA-C 07/04/2015 3:03 PM

## 2015-07-04 NOTE — Telephone Encounter (Signed)
Patient has appointment today, 07/04/15, at [facility] for [name].Jennifer KitchenLesly Dukes

## 2015-07-05 ENCOUNTER — Encounter: Payer: Self-pay | Admitting: Obstetrics & Gynecology

## 2015-07-05 ENCOUNTER — Ambulatory Visit (INDEPENDENT_AMBULATORY_CARE_PROVIDER_SITE_OTHER): Payer: Medicare Other | Admitting: Obstetrics & Gynecology

## 2015-07-05 VITALS — BP 132/78 | HR 68 | Resp 16 | Ht 60.75 in | Wt 208.0 lb

## 2015-07-05 DIAGNOSIS — Z0000 Encounter for general adult medical examination without abnormal findings: Secondary | ICD-10-CM

## 2015-07-05 DIAGNOSIS — E1065 Type 1 diabetes mellitus with hyperglycemia: Secondary | ICD-10-CM | POA: Diagnosis not present

## 2015-07-05 DIAGNOSIS — Z01419 Encounter for gynecological examination (general) (routine) without abnormal findings: Secondary | ICD-10-CM

## 2015-07-05 LAB — POCT URINALYSIS DIPSTICK
Bilirubin, UA: NEGATIVE
Blood, UA: NEGATIVE
Glucose, UA: 50
Ketones, UA: NEGATIVE
Nitrite, UA: NEGATIVE
Protein, UA: NEGATIVE
Urobilinogen, UA: NEGATIVE
pH, UA: 5

## 2015-07-05 NOTE — Telephone Encounter (Signed)
Out of MMG recall.

## 2015-07-05 NOTE — Progress Notes (Addendum)
69 y.o. G1P1 MarriedCaucasianF here for annual exam.  Pt reports she is doing well.  Saw oncology yesterday due to lymphocytosis.  Hematology work up for leukemia is negative with no additional work-up currently recommended.  She is going to have follow up with oncology in one year.  No vaginal bleeding.   Continues to have issues with gas and bloating.  Has been on Remicade for about two full years.  Just wishes this would improve.  Does feel pressure sensation is better.  Has never seen a specialist outside of [place].  Asked pt if she would consider this.  Also, reports having a lot of dry mouth with Cymbalta which she is on for neuropathy.  Would like suggestions for alternative medications.  She didn't feel Dr. Posey Pronto really even listened to her at last visit.    No LMP recorded. Patient has had a hysterectomy.          Sexually active: Yes.    The current method of family planning is status post hysterectomy.    Exercising: No.  not regularly Smoker:  no  Health Maintenance: Pap:  10/12 History of abnormal Pap:  no MMG:  07/04/15 3D BiRads 2-Benign Colonoscopy:  2009-repeat in 10 years BMD:   9/10 TDaP:  06/15/09 Screening Labs: PCP, Hb today: PCP, Urine today: WBC-trace, GLUCOSE-50   reports that she has never smoked. She has never used smokeless tobacco. She reports that she drinks alcohol. She reports that she does not use illicit drugs.  Past Medical History  Diagnosis Date  . GERD   . Osteoarthritis   . HYPERTENSION   . IDDM (insulin dependent diabetes mellitus) ([place])   . Ulcerative colitis ([facility])     Remicade infusion Q 8 weeks  . History of thrombocytopenia 12/2011  . 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 Dental bridge present     lower  . Anxiety     Past Surgical History  Procedure Laterality Date  . Abdominal hysterectomy  1980    partial  . Breast reduction surgery  1994  . Rectocele repair  1990; 09/12/2006  . Carpal tunnel release  Right 1996  . Knee arthroscopy Right 01/1999; 10/2000  . Hemilaminotomy lumbar spine Bilateral 09/07/1999    L4-5  . Cholecystectomy    . Flexible sigmoidoscopy  01/17/2012    Procedure: FLEXIBLE SIGMOIDOSCOPY;  Surgeon: Rogene Houston, MD;  [facility];  Service: Endoscopy;  Laterality: N/A;  . Bilateral salpingoophorectomy  02/10/2001  . Tarsal tunnel release  2002  . Ureterolysis Right 02/10/2001  . Lysis of adhesion  02/10/2001  . Carpal tunnel release Left 03/21/2003  . 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 Esophagogastroduodenoscopy (egd) with esophageal dilation  12/02/2005  . Carpal tunnel release Right 05/04/2013    Procedure: CARPAL TUNNEL RELEASE;  Surgeon: Cammie Sickle., MD;  Location: [place];  Service: Orthopedics;  Laterality: Right;  . Carpal tunnel release Left 09/21/2013    Procedure: LEFT CARPAL TUNNEL RELEASE;  Surgeon: Cammie Sickle., MD;  Location: [facility];  Service: Orthopedics;  Laterality: Left;    Family History  Problem Relation Age of Onset  . Diabetes Mother   . Hypertension Mother   . Heart attack Father     Mid 33's  . Heart disease Father   . Lung disease Father     spot on lung; had  lung surgery  . Alcohol abuse Other   . Hypertension Son   . Diabetes Son     ROS:  Pertinent items are noted in HPI.  Otherwise, a comprehensive ROS was negative.  Exam:   Vitals reviewed.  General appearance: alert, cooperative and appears stated age Head: Normocephalic, without obvious abnormality, atraumatic Neck: no adenopathy, supple, symmetrical, trachea midline and thyroid normal to inspection and palpation Lungs: clear to auscultation bilaterally Breasts: normal appearance, no masses or tenderness Heart: regular rate and rhythm Abdomen: soft, non-tender; bowel sounds normal; no masses,  no organomegaly Extremities: extremities normal, atraumatic, no cyanosis or edema Skin: Skin  color, texture, turgor normal. No rashes or lesions Lymph nodes: Cervical, supraclavicular, and axillary nodes normal. No abnormal inguinal nodes palpated Neurologic: Grossly normal   Pelvic: External genitalia:  no lesions              Urethra:  normal appearing urethra with no masses, tenderness or lesions              Bartholins and Skenes: normal                 Vagina: normal appearing vagina with normal color and discharge, no lesions              Cervix: not present              Pap taken: No. Bimanual Exam:  Uterus:  uterus absent              Adnexa: no mass, fullness, tenderness               Rectovaginal: Confirms               Anus:  normal sphincter tone, no lesions  Chaperone was present for exam.  A:  Well Woman with normal exam Hypertension Neuropathy Dyslipidemia GERD U.C. Followed by Dr. Laural [name].  On Remicade about two full years.  Still with bloating.  Diarrhea is better.   Lymphocytosis followed by oncology.  No additional work up recommended. Diabetes--on insulin. Followed by Dr. Asa Lente.  Much better control.  (Hb A1C 7.4 9/14, 9.3 10/20/13, 6.5 02/11/15.   Anxiety H/O TAH, later BSO  P: Mammogram done yesterday. No pap obtained Will find out questions regarding GI referral for pt and let her know. Also will find out if she can have a second opinion with neurologist at [facility]. return annually or prn

## 2015-07-05 NOTE — Progress Notes (Signed)
LABS DRAWN

## 2015-07-05 NOTE — Telephone Encounter (Signed)
Patient removed from current recall. Closing encounter.

## 2015-07-09 ENCOUNTER — Other Ambulatory Visit: Payer: Self-pay | Admitting: Obstetrics & Gynecology

## 2015-07-09 DIAGNOSIS — K51318 Ulcerative (chronic) rectosigmoiditis with other complication: Secondary | ICD-10-CM

## 2015-07-09 NOTE — Addendum Note (Signed)
Addended by: Megan Salon on: 07/09/2015 04:26 PM   Modules accepted: Miquel Dunn

## 2015-07-11 ENCOUNTER — Encounter (INDEPENDENT_AMBULATORY_CARE_PROVIDER_SITE_OTHER): Payer: Self-pay | Admitting: *Deleted

## 2015-07-11 ENCOUNTER — Encounter (INDEPENDENT_AMBULATORY_CARE_PROVIDER_SITE_OTHER): Payer: Self-pay | Admitting: Internal Medicine

## 2015-07-11 ENCOUNTER — Other Ambulatory Visit (INDEPENDENT_AMBULATORY_CARE_PROVIDER_SITE_OTHER): Payer: Self-pay | Admitting: *Deleted

## 2015-07-11 ENCOUNTER — Ambulatory Visit (INDEPENDENT_AMBULATORY_CARE_PROVIDER_SITE_OTHER): Payer: Medicare Other | Admitting: Internal Medicine

## 2015-07-11 VITALS — BP 120/74 | HR 76 | Temp 97.4°F | Resp 18 | Ht 61.0 in | Wt 208.8 lb

## 2015-07-11 DIAGNOSIS — R143 Flatulence: Secondary | ICD-10-CM

## 2015-07-11 DIAGNOSIS — R1314 Dysphagia, pharyngoesophageal phase: Secondary | ICD-10-CM | POA: Diagnosis not present

## 2015-07-11 DIAGNOSIS — R1310 Dysphagia, unspecified: Secondary | ICD-10-CM

## 2015-07-11 DIAGNOSIS — K5190 Ulcerative colitis, unspecified, without complications: Secondary | ICD-10-CM

## 2015-07-11 DIAGNOSIS — K219 Gastro-esophageal reflux disease without esophagitis: Secondary | ICD-10-CM

## 2015-07-11 DIAGNOSIS — R1319 Other dysphagia: Secondary | ICD-10-CM

## 2015-07-11 NOTE — Telephone Encounter (Signed)
This encounter was created in error - please disregard.

## 2015-07-11 NOTE — Telephone Encounter (Signed)
Patient needs trilyte 

## 2015-07-11 NOTE — Patient Instructions (Signed)
Hydrogen breath test to be scheduled. Esophagogastroduodenoscopy with esophageal dilation and colonoscopy to be scheduled. Can take Imodium OTC 2 mg before breakfast and lunch daily rather than when necessary

## 2015-07-11 NOTE — Progress Notes (Signed)
Presenting complaint;  Follow-up for ulcerative colitis and GERD. Patient complains of dysphagia, flatulence and diarrhea.  Subjective:  Patient is 69 year old Caucasian female who is here for scheduled visit. She was last seen on 12/27/2014. She remains on infliximab every 8 weeks. Next dose is due on 08/15/2015. She denies rectal bleeding but complains of flatulence and diarrhea. She states she has tried over-the-counter medications but has not helped with flatulence. She says flatulence is bad enough that she is afraid to leave house and be in a crowd because she has no control. She states she is having anywhere from 2-8 stools per day. First 2 stools are always formed and then there are loose stools. However she is not passing any blood. She has noted intermittent right upper quadrant which she describes as gas bubble. It does not last long. She states she has no energy since she has been on Cymbalta. She also complains of dysphagia with solids and pills which started about 6 months ago and has been occurring more and more frequently. She feels heartburns well controlled with Nexium but her co-pay has increased and she would like to try another medication. Patient's last colonoscopy was in February 2009 and she had a flexible sigmoidoscopy in April 2013 when she was hospitalized with acute flare and also felt to have superadded C. difficile colitis.   Current Medications: Outpatient Encounter Prescriptions as of 07/11/2015  Medication Sig  . azelastine (ASTELIN) 0.1 % nasal spray Place 2 sprays into both nostrils 2 (two) times daily.  . B-D UF III MINI PEN NEEDLES 31G X 5 MM MISC   . DULoxetine (CYMBALTA) 60 MG capsule Take 60 mg by mouth daily.   . furosemide (LASIX) 40 MG tablet Take 1 tablet (40 mg total) by mouth daily.  Marland Kitchen glucose blood (ONE TOUCH ULTRA TEST) test strip 1 each by Other route 4 (four) times daily. Use to check blood sugars four times a day Dx E11.65  . inFLIXimab  (REMICADE) 100 MG injection Inject into the vein every 8 (eight) weeks.   . insulin aspart (NOVOLOG) 100 UNIT/ML FlexPen Inject 130 Units into the skin 3 (three) times daily with meals.  . Insulin Detemir (LEVEMIR) 100 UNIT/ML Pen Inject 70 Units into the skin at bedtime.  . Insulin Pen Needle 31G X 6 MM MISC Use 1 needle four times a day to inject daily insulin dose.  . lactase (LACTAID) 3000 UNITS tablet Take 3,000 Units by mouth as needed.   . loperamide (IMODIUM) 2 MG capsule Take 2 mg by mouth 3 (three) times daily as needed.   . meclizine (ANTIVERT) 25 MG tablet Take 1 tablet (25 mg total) by mouth 3 (three) times daily as needed for dizziness or nausea.  . metoprolol succinate (TOPROL-XL) 100 MG 24 hr tablet Take 1 tablet (100 mg total) by mouth 2 (two) times daily.  Marland Kitchen NEXIUM 40 MG capsule Take 40 mg by mouth daily.   Glory Rosebush DELICA LANCETS 09G MISC PATIENT USES ONE TOUCH DELICAL 28Z LANCETS. USE AS DIRECTED THREE DAILY' DIAGNOSIS CODE E11.09  . potassium chloride (KLOR-CON M10) 10 MEQ tablet Take 2 tablets (20 mEq total) by mouth daily.  . Saccharomyces boulardii (FLORASTOR PO) Take by mouth daily.  . [DISCONTINUED] cromolyn (OPTICROM) 4 % ophthalmic solution Place 1 drop into both eyes.   . [DISCONTINUED] lidocaine (XYLOCAINE) 5 % ointment Apply to hands and feet twice daily. (Patient not taking: Reported on 07/11/2015)   No facility-administered encounter medications on file as  of 07/11/2015.     Objective: Blood pressure 120/74, pulse 76, temperature 97.4 F (36.3 C), temperature source Oral, resp. rate 18, height 5' 1"  (1.549 m), weight 208 lb 12.8 oz (94.711 kg). Patient is alert and in no acute distress. Conjunctiva is pink. Sclera is nonicteric Oropharyngeal mucosa is normal. No neck masses or thyromegaly noted. Cardiac exam with regular rhythm normal S1 and S2. No murmur or gallop noted. Lungs are clear to auscultation. Abdomen is full. Bowel sounds are normal. On  palpation abdomen is soft with mild midepigastric tenderness. No organomegaly or masses.  No LE edema or clubbing noted.  Labs/studies Results: Lab data from 07/04/2015  WBC 6.6, neutrophils 20% and lymphocytes 68%  H&H 14.1 and 40.0 and platelet count 170 K  Glucose 179  BUN 13 and creatinine 0.67  Bilirubin 0.6, AP 70, AST 40, ALT 26, albumin 3.5  Serum calcium 8.7.    Assessment:  #1. Chronic ulcerative colitis. She has remained in remission for about 3 years but now she has nonbloody diarrhea. Suspect diarrhea secondary to irritable bowel syndrome says she always has formed stools to begin with. Last colonoscopy was in 2009. She is long overdue for surveillance colonoscopy. She remains on infliximab for maintenance. #2. Solid food dysphagia. She has responded to esophageal dilations in the past but no structural abnormality noted on prior exams. #3. GERD. Heartburns well controlled with therapy with cold air for Nexium has increased significantly and she would like to try another PPI. Will give her a trial with Dexilant when samples available. #4. 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 She has been evaluated by oncologist and lymphoproliferative disorder ruled out.   Plan:  Hydrogen breath test to determine if she has small intestinal bacterial overgrowth. Patient advised to take loperamide OTC 2 mg before breakfast and subsequent doses on as-needed basis. Will schedule EGD with ED and colonoscopy under monitored anesthesia care.

## 2015-07-13 ENCOUNTER — Ambulatory Visit (HOSPITAL_COMMUNITY)
Admission: RE | Admit: 2015-07-13 | Discharge: 2015-07-13 | Disposition: A | Discharge status: Home / Self Care | Payer: Medicare Other | Source: Ambulatory Visit | Attending: Internal Medicine | Admitting: Internal Medicine

## 2015-07-13 ENCOUNTER — Encounter (HOSPITAL_COMMUNITY)
Admission: RE | Disposition: A | Discharge status: Home / Self Care | Payer: Self-pay | Source: Ambulatory Visit | Attending: Internal Medicine

## 2015-07-13 DIAGNOSIS — R197 Diarrhea, unspecified: Secondary | ICD-10-CM | POA: Insufficient documentation

## 2015-07-13 DIAGNOSIS — R143 Flatulence: Secondary | ICD-10-CM | POA: Insufficient documentation

## 2015-07-13 HISTORY — PX: BACTERIAL OVERGROWTH TEST: SHX5739

## 2015-07-13 SURGERY — BREATH TEST, FOR INTESTINAL BACTERIAL OVERGROWTH

## 2015-07-13 MED ORDER — LACTULOSE 10 GM/15ML PO SOLN
ORAL | Status: AC
  Filled 2015-07-13: qty 30

## 2015-07-14 ENCOUNTER — Other Ambulatory Visit: Payer: Self-pay | Admitting: Obstetrics & Gynecology

## 2015-07-14 DIAGNOSIS — G609 Hereditary and idiopathic neuropathy, unspecified: Secondary | ICD-10-CM

## 2015-07-14 NOTE — Discharge Instructions (Signed)
No beans, bran or high fiber cereal the day before the procedure? {YES NO:22349} NPO except for water 12 hours before procedure? {YES NO:22349} No smoking, sleeping or vigorous exercising for at least 30 before procedure? {YES NO:22349} Recent antibiotic use and/or diarrhea? {YES NO:22349}   If yes, physician notified.  Time Baseline 15 mins 30 mins 45 mins 60 mins 75 mins 90 mins 105 mins 120 mins 135 mins 150 mins 165 mins 180 mins  H2-ppm 7 5 8 6 10 13 15    18   30    34 44   63   29

## 2015-07-18 ENCOUNTER — Encounter (HOSPITAL_COMMUNITY): Payer: Self-pay | Admitting: Internal Medicine

## 2015-07-28 NOTE — Patient Instructions (Signed)
[name]  07/28/2015     @PREFPERIOPPHARMACY @   Your procedure is scheduled on 08/04/2015.  Report to [facility] at [place]M.  Call this number if you have problems the morning of surgery:  [phone]   Remember:  Do not eat food or drink liquids after midnight.  Take these medicines the morning of surgery with A SIP OF WATER Cymbalta, Meclizine, Nexium   Do not wear jewelry, make-up or nail polish.  Do not wear lotions, powders, or perfumes.  You may wear deodorant.  Do not shave 48 hours prior to surgery.  Men may shave face and neck.  Do not bring valuables to the hospital.  [facility] is not responsible for any belongings or valuables.  Contacts, dentures or bridgework may not be worn into surgery.  Leave your suitcase in the car.  After surgery it may be brought to your room.  For patients admitted to the hospital, discharge time will be determined by your treatment team.  Patients discharged the day of surgery will not be allowed to drive home.    Please read over the following fact sheets that you were given. Anesthesia Post-op Instructions     PATIENT INSTRUCTIONS POST-ANESTHESIA  IMMEDIATELY FOLLOWING SURGERY:  Do not drive or operate machinery for the first twenty four hours after surgery.  Do not make any important decisions for twenty four hours after surgery or while taking narcotic pain medications or sedatives.  If you develop intractable nausea and vomiting or a severe headache please notify your doctor immediately.  FOLLOW-UP:  Please make an appointment with your surgeon as instructed. You do not need to follow up with anesthesia unless specifically instructed to do so.  WOUND CARE INSTRUCTIONS (if applicable):  Keep a dry clean dressing on the anesthesia/puncture wound site if there is drainage.  Once the wound has quit draining you may leave it open to air.  Generally you should leave the bandage intact for twenty four hours unless there is  drainage.  If the epidural site drains for more than 36-48 hours please call the anesthesia department.  QUESTIONS?:  Please feel free to call your physician or the hospital operator if you have any questions, and they will be happy to assist you.      Esophagogastroduodenoscopy Esophagogastroduodenoscopy (EGD) is a procedure that is used to examine the lining of the esophagus, stomach, and first part of the small intestine (duodenum). A long, flexible, lighted tube with a camera attached (endoscope) is inserted down the throat to view these organs. This procedure is done to detect problems or abnormalities, such as inflammation, bleeding, ulcers, or growths, in order to treat them. The procedure lasts 5-20 minutes. It is usually an outpatient procedure, but it may need to be performed in a hospital in emergency cases. LET [facility] CARE PROVIDER KNOW ABOUT:  Any allergies you have.  All medicines you are taking, including vitamins, herbs, eye drops, creams, and over-the-counter medicines.  Previous problems you or members of your family have had with the use of anesthetics.  Any blood disorders you have.  Previous surgeries you have had.  Medical conditions you have. RISKS AND COMPLICATIONS Generally, this is a safe procedure. However, problems can occur and include:  Infection.  Bleeding.  Tearing (perforation) of the esophagus, stomach, or duodenum.  Difficulty breathing or not being able to breathe.  Excessive sweating.  Spasms of the larynx.  Slowed heartbeat.  Low blood pressure. BEFORE THE PROCEDURE  Do not eat or drink anything after midnight on the night before the procedure or as directed by your health care provider.  Do not take your regular medicines before the procedure if your health care provider asks you not to. Ask your health care provider about changing or stopping those medicines.  If you wear dentures, be prepared to remove them before the  procedure.  Arrange for someone to drive you home after the procedure. PROCEDURE  A numbing medicine (local anesthetic) may be sprayed in your throat for comfort and to stop you from gagging or coughing.  You will have an IV tube inserted in a vein in your hand or arm. You will receive medicines and fluids through this tube.  You will be given a medicine to relax you (sedative).  A pain reliever will be given through the IV tube.  A mouth guard may be placed in your mouth to protect your teeth and to keep you from biting on the endoscope.  You will be asked to lie on your left side.  The endoscope will be inserted down your throat and into your esophagus, stomach, and duodenum.  Air will be put through the endoscope to allow your health care provider to clearly view the lining of your esophagus.  The lining of your esophagus, stomach, and duodenum will be examined. During the exam, your health care provider may:  Remove tissue to be examined under a microscope (biopsy) for inflammation, infection, or other medical problems.  Remove growths.  Remove objects (foreign bodies) that are stuck.  Treat any bleeding with medicines or other devices that stop tissues from bleeding (hot cautery, clipping devices).  Widen (dilate) or stretch narrowed areas of your esophagus and stomach.  The endoscope will be withdrawn. AFTER THE PROCEDURE  You will be taken to a recovery area for observation. Your blood pressure, heart rate, breathing rate, and blood oxygen level will be monitored often until the medicines you were given have worn off.  Do not eat or drink anything until the numbing medicine has worn off and your gag reflex has returned. You may choke.  Your health care provider should be able to discuss his or her findings with you. It will take longer to discuss the test results if any biopsies were taken.   This information is not intended to replace advice given to you by your  health care provider. Make sure you discuss any questions you have with your health care provider.   Document Released: 01/17/2005 Document Revised: 10/07/2014 Document Reviewed: 08/19/2012 Elsevier Interactive Patient Education 2016 [employer] Esophageal Dilatation Esophageal dilatation is a procedure to open a blocked or narrowed part of the esophagus. The esophagus is the long tube in your throat that carries food and liquid from your mouth to your stomach. The procedure is also called esophageal dilation.  You may need this procedure if you have a buildup of scar tissue in your esophagus that makes it difficult, painful, or even impossible to swallow. This can be caused by gastroesophageal reflux disease (GERD). In rare cases, people need this procedure because they have cancer of the esophagus or a problem with the way food moves through the esophagus. Sometimes you may need to have another dilatation to enlarge the opening of the esophagus gradually. LET [facility] CARE PROVIDER KNOW ABOUT:   Any allergies you have.  All medicines you are taking, including vitamins, herbs, eye drops, creams, and over-the-counter medicines.  Previous problems you or members of  your family have had with the use of anesthetics.  Any blood disorders you have.  Previous surgeries you have had.  Medical conditions you have.  Any antibiotic medicines you are required to take before dental procedures. RISKS AND COMPLICATIONS Generally, this is a safe procedure. However, problems can occur and include:  Bleeding from a tear in the lining of the esophagus.  A hole (perforation) in the esophagus. BEFORE THE PROCEDURE  Do not eat or drink anything after midnight on the night before the procedure or as directed by your health care provider.  Ask your health care provider about changing or stopping your regular medicines. This is especially important if you are taking diabetes medicines or blood  thinners.  Plan to have someone take you home after the procedure. PROCEDURE   You will be given a medicine that makes you relaxed and sleepy (sedative).  A medicine may be sprayed or gargled to numb the back of the throat.  Your health care provider can use various instruments to do an esophageal dilatation. During the procedure, the instrument used will be placed in your mouth and passed down into your esophagus. Options include:  Simple dilators. This instrument is carefully placed in the esophagus to stretch it.  Guided wire bougies. In this method, a flexible tube (endoscope) is used to insert a wire into the esophagus. The dilator is passed over this wire to enlarge the esophagus. Then the wire is removed.  Balloon dilators. An endoscope with a small balloon at the end is passed down into the esophagus. Inflating the balloon gently stretches the esophagus and opens it up. AFTER THE PROCEDURE  Your blood pressure, heart rate, breathing rate, and blood oxygen level will be monitored often until the medicines you were given have worn off.  Your throat may feel slightly sore and will probably still feel numb. This will improve slowly over time.  You will not be allowed to eat or drink until the throat numbness has resolved.  If this is a same-day procedure, you may be allowed to go home once you have been able to drink, urinate, and sit on the edge of the bed without nausea or dizziness.  If this is a same-day procedure, you should have a friend or family member with you for the next 24 hours after the procedure.   This information is not intended to replace advice given to you by your health care provider. Make sure you discuss any questions you have with your health care provider.   Document Released: 11/07/2005 Document Revised: 10/07/2014 Document Reviewed: 01/26/2014 Elsevier Interactive Patient Education [employer].

## 2015-07-31 ENCOUNTER — Encounter (HOSPITAL_COMMUNITY): Payer: Self-pay

## 2015-07-31 ENCOUNTER — Encounter (HOSPITAL_COMMUNITY)
Admission: RE | Admit: 2015-07-31 | Discharge: 2015-07-31 | Disposition: A | Discharge status: Home / Self Care | Payer: Medicare Other | Source: Ambulatory Visit | Attending: Internal Medicine | Admitting: Internal Medicine

## 2015-07-31 ENCOUNTER — Other Ambulatory Visit: Payer: Self-pay

## 2015-07-31 DIAGNOSIS — Z01818 Encounter for other preprocedural examination: Secondary | ICD-10-CM | POA: Insufficient documentation

## 2015-07-31 DIAGNOSIS — K5190 Ulcerative colitis, unspecified, without complications: Secondary | ICD-10-CM | POA: Diagnosis not present

## 2015-07-31 DIAGNOSIS — K219 Gastro-esophageal reflux disease without esophagitis: Secondary | ICD-10-CM | POA: Diagnosis not present

## 2015-07-31 DIAGNOSIS — R1310 Dysphagia, unspecified: Secondary | ICD-10-CM | POA: Diagnosis not present

## 2015-07-31 DIAGNOSIS — R143 Flatulence: Secondary | ICD-10-CM | POA: Diagnosis not present

## 2015-07-31 HISTORY — DX: Other complications of anesthesia, initial encounter: T88.59XA

## 2015-07-31 NOTE — Op Note (Signed)
[name], [name]       CSN: [id number]  MEDICAL RECORD NO: [id number]    PATIENT TYPE: AMB  LOCATION: [place]  PHYSICIAN: [name], MD          DOB: 09-20-1946  PROCEDURE DATE: 07/13/2015   PROCEDURE: Small bowel bacterial overgrowth study.  INDICATION: Patient is 69 year old Caucasian female who has ulcerative colitis in remission who presents with intractable flatulence. She she states it is so bad that she has no control over it and she's afraid to to be in company of others.   Procedure: Patient followed and met all the preconditions in the study protocol. She was given 25 g of lactulose at 8 ounces of water 30 minutes before the procedure. Alveolar air sample was collected every 15 minutes for total of 180 minutes for total of 12 samples in all.  Findings: Hydrogen concentration. Baseline              7 ppm Hydrogen concentration at 15 minutes        5 ppm Hydrogen concentration of 30 minutes        8 ppm Hydrogen concentration at 45 minutes        6 ppm Hydrogen concentration at 60 minutes        10 ppm Hydrogen concentration at 75 minutes        13 ppm Hydrogen concentration at 90 minutes        15 ppm Hydrogen concentration and 105 minutes   18 ppm Hydrogen concentration at 120 minutes       30 ppm Hydrogen concentration at 135 minutes       34 ppm Hydrogen concentration at 150 minutes       44 ppm Hydrogen concentration at 165 minutes        63 ppm Surgeon concentration and 180 minutes       20 ppm  Assessment: This is a normal study.  Second peak 165 minutes is secondary to colonic activity.    Recommendations: Patient advised to decrease intake of carbohydrates. She will call if flatulence gets any worse.

## 2015-08-01 ENCOUNTER — Telehealth: Payer: Self-pay | Admitting: Obstetrics & Gynecology

## 2015-08-01 NOTE — Telephone Encounter (Signed)
Attempted call to patient to review current status of referral to Dr Carlos Levering. Left voicemail to return call.

## 2015-08-04 ENCOUNTER — Encounter (HOSPITAL_COMMUNITY)
Admission: RE | Disposition: A | Discharge status: Home / Self Care | Payer: Self-pay | Source: Ambulatory Visit | Attending: Internal Medicine

## 2015-08-04 ENCOUNTER — Ambulatory Visit (HOSPITAL_COMMUNITY): Payer: Medicare Other | Admitting: Anesthesiology

## 2015-08-04 ENCOUNTER — Inpatient Hospital Stay (HOSPITAL_COMMUNITY)
Admission: RE | Admit: 2015-08-04 | Discharge: 2015-08-05 | DRG: 309 | Disposition: A | Discharge status: Home / Self Care | Payer: Medicare Other | Source: Ambulatory Visit | Attending: Internal Medicine | Admitting: Internal Medicine

## 2015-08-04 ENCOUNTER — Inpatient Hospital Stay (HOSPITAL_COMMUNITY): Payer: Medicare Other

## 2015-08-04 ENCOUNTER — Other Ambulatory Visit: Payer: Self-pay

## 2015-08-04 DIAGNOSIS — K648 Other hemorrhoids: Secondary | ICD-10-CM | POA: Diagnosis not present

## 2015-08-04 DIAGNOSIS — K449 Diaphragmatic hernia without obstruction or gangrene: Secondary | ICD-10-CM | POA: Diagnosis not present

## 2015-08-04 DIAGNOSIS — Z6838 Body mass index (BMI) 38.0-38.9, adult: Secondary | ICD-10-CM

## 2015-08-04 DIAGNOSIS — Z794 Long term (current) use of insulin: Secondary | ICD-10-CM

## 2015-08-04 DIAGNOSIS — K219 Gastro-esophageal reflux disease without esophagitis: Secondary | ICD-10-CM | POA: Insufficient documentation

## 2015-08-04 DIAGNOSIS — E1165 Type 2 diabetes mellitus with hyperglycemia: Secondary | ICD-10-CM | POA: Diagnosis not present

## 2015-08-04 DIAGNOSIS — K644 Residual hemorrhoidal skin tags: Secondary | ICD-10-CM | POA: Diagnosis not present

## 2015-08-04 DIAGNOSIS — E785 Hyperlipidemia, unspecified: Secondary | ICD-10-CM | POA: Diagnosis not present

## 2015-08-04 DIAGNOSIS — G4733 Obstructive sleep apnea (adult) (pediatric): Secondary | ICD-10-CM | POA: Diagnosis present

## 2015-08-04 DIAGNOSIS — K317 Polyp of stomach and duodenum: Secondary | ICD-10-CM | POA: Diagnosis present

## 2015-08-04 DIAGNOSIS — Z888 Allergy status to other drugs, medicaments and biological substances status: Secondary | ICD-10-CM | POA: Diagnosis not present

## 2015-08-04 DIAGNOSIS — Z91013 Allergy to seafood: Secondary | ICD-10-CM

## 2015-08-04 DIAGNOSIS — I10 Essential (primary) hypertension: Secondary | ICD-10-CM | POA: Diagnosis present

## 2015-08-04 DIAGNOSIS — I959 Hypotension, unspecified: Secondary | ICD-10-CM | POA: Diagnosis not present

## 2015-08-04 DIAGNOSIS — Z885 Allergy status to narcotic agent status: Secondary | ICD-10-CM

## 2015-08-04 DIAGNOSIS — R1310 Dysphagia, unspecified: Secondary | ICD-10-CM | POA: Insufficient documentation

## 2015-08-04 DIAGNOSIS — E669 Obesity, unspecified: Secondary | ICD-10-CM | POA: Diagnosis present

## 2015-08-04 DIAGNOSIS — K5190 Ulcerative colitis, unspecified, without complications: Secondary | ICD-10-CM | POA: Diagnosis present

## 2015-08-04 DIAGNOSIS — Z7901 Long term (current) use of anticoagulants: Secondary | ICD-10-CM

## 2015-08-04 DIAGNOSIS — I509 Heart failure, unspecified: Secondary | ICD-10-CM | POA: Diagnosis not present

## 2015-08-04 DIAGNOSIS — E118 Type 2 diabetes mellitus with unspecified complications: Secondary | ICD-10-CM

## 2015-08-04 DIAGNOSIS — R143 Flatulence: Secondary | ICD-10-CM

## 2015-08-04 DIAGNOSIS — I4891 Unspecified atrial fibrillation: Principal | ICD-10-CM | POA: Diagnosis present

## 2015-08-04 HISTORY — PX: ESOPHAGOGASTRODUODENOSCOPY (EGD) WITH PROPOFOL: SHX5813

## 2015-08-04 HISTORY — PX: ESOPHAGEAL DILATION: SHX303

## 2015-08-04 HISTORY — PX: COLONOSCOPY WITH PROPOFOL: SHX5780

## 2015-08-04 HISTORY — PX: TRANSTHORACIC ECHOCARDIOGRAM: SHX275

## 2015-08-04 LAB — GLUCOSE, CAPILLARY
GLUCOSE-CAPILLARY: 101 mg/dL — AB (ref 65–99)
GLUCOSE-CAPILLARY: 292 mg/dL — AB (ref 65–99)
GLUCOSE-CAPILLARY: 289 mg/dL — AB (ref 65–99)
Glucose-Capillary: 86 mg/dL (ref 65–99)

## 2015-08-04 LAB — MAGNESIUM: MAGNESIUM: 1.9 mg/dL (ref 1.7–2.4)

## 2015-08-04 LAB — CBC WITH DIFFERENTIAL/PLATELET
BASOS ABS: 0 10*3/uL (ref 0.0–0.1)
BASOS PCT: 0 %
EOS ABS: 0 10*3/uL (ref 0.0–0.7)
EOS PCT: 0 %
HCT: 40.8 % (ref 36.0–46.0)
Hemoglobin: 14.1 g/dL (ref 12.0–15.0)
Lymphocytes Relative: 38 %
Lymphs Abs: 2.1 10*3/uL (ref 0.7–4.0)
MCH: 32.1 pg (ref 26.0–34.0)
MCHC: 34.6 g/dL (ref 30.0–36.0)
MCV: 92.9 fL (ref 78.0–100.0)
MONO ABS: 0.1 10*3/uL (ref 0.1–1.0)
Monocytes Relative: 2 %
Neutro Abs: 3.4 10*3/uL (ref 1.7–7.7)
Neutrophils Relative %: 60 %
PLATELETS: 150 10*3/uL (ref 150–400)
RBC: 4.39 MIL/uL (ref 3.87–5.11)
RDW: 13 % (ref 11.5–15.5)
WBC: 5.7 10*3/uL (ref 4.0–10.5)

## 2015-08-04 LAB — TROPONIN I
Troponin I: <0.03 ng/mL (ref <0.031)
Troponin I: <0.03 ng/mL (ref <0.031)

## 2015-08-04 LAB — COMPREHENSIVE METABOLIC PANEL
ALK PHOS: 69 U/L (ref 38–126)
ALT: 28 U/L (ref 14–54)
AST: 45 U/L — AB (ref 15–41)
Albumin: 3.3 g/dL — ABNORMAL LOW (ref 3.5–5.0)
Anion gap: 9 (ref 5–15)
BUN: 13 mg/dL (ref 6–20)
CALCIUM: 8.6 mg/dL — AB (ref 8.9–10.3)
CHLORIDE: 104 mmol/L (ref 101–111)
CO2: 25 mmol/L (ref 22–32)
CREATININE: 0.6 mg/dL (ref 0.44–1.00)
GFR calc Af Amer: >60 mL/min (ref >60)
Glucose, Bld: 143 mg/dL — ABNORMAL HIGH (ref 65–99)
Potassium: 4.3 mmol/L (ref 3.5–5.1)
Sodium: 138 mmol/L (ref 135–145)
Total Bilirubin: 1 mg/dL (ref 0.3–1.2)
Total Protein: 6.7 g/dL (ref 6.5–8.1)

## 2015-08-04 LAB — PROTIME-INR
INR: 1.15 (ref 0.00–1.49)
Prothrombin Time: 14.9 seconds (ref 11.6–15.2)

## 2015-08-04 LAB — CBC

## 2015-08-04 LAB — TSH: TSH: 2.031 u[IU]/mL (ref 0.350–4.500)

## 2015-08-04 SURGERY — ESOPHAGOGASTRODUODENOSCOPY (EGD) WITH PROPOFOL
Anesthesia: Monitor Anesthesia Care

## 2015-08-04 MED ORDER — AMIODARONE IV BOLUS ONLY 150 MG/100ML
150.0000 mg | Freq: Once | INTRAVENOUS | Status: AC
  Administered 2015-08-04: 150 mg via INTRAVENOUS
  Filled 2015-08-04: qty 100

## 2015-08-04 MED ORDER — MIDAZOLAM HCL 5 MG/5ML IJ SOLN
INTRAMUSCULAR | Status: DC | PRN
  Administered 2015-08-04: 2 mg via INTRAVENOUS

## 2015-08-04 MED ORDER — APIXABAN 5 MG PO TABS
5.0000 mg | ORAL_TABLET | Freq: Two times a day (BID) | ORAL | Status: DC
Start: 2015-08-04 — End: 2015-08-05
  Administered 2015-08-04 – 2015-08-05 (×3): 5 mg via ORAL
  Filled 2015-08-04 (×3): qty 1

## 2015-08-04 MED ORDER — FENTANYL CITRATE (PF) 100 MCG/2ML IJ SOLN
25.0000 ug | INTRAMUSCULAR | Status: AC
  Administered 2015-08-04: 25 ug via INTRAVENOUS

## 2015-08-04 MED ORDER — INSULIN ASPART 100 UNIT/ML ~~LOC~~ SOLN
0.0000 [IU] | Freq: Three times a day (TID) | SUBCUTANEOUS | Status: DC
  Administered 2015-08-05: 2 [IU] via SUBCUTANEOUS

## 2015-08-04 MED ORDER — FUROSEMIDE 40 MG PO TABS
40.0000 mg | ORAL_TABLET | Freq: Every day | ORAL | Status: DC
  Administered 2015-08-04 – 2015-08-05 (×2): 40 mg via ORAL
  Filled 2015-08-04 (×2): qty 1

## 2015-08-04 MED ORDER — INSULIN ASPART 100 UNIT/ML ~~LOC~~ SOLN: 130.0000 [IU] | Freq: Three times a day (TID) | SUBCUTANEOUS | Status: DC

## 2015-08-04 MED ORDER — LIDOCAINE VISCOUS 2 % MT SOLN
15.0000 mL | Freq: Once | OROMUCOSAL | Status: AC
  Administered 2015-08-04: 15 mL via OROMUCOSAL

## 2015-08-04 MED ORDER — FENTANYL CITRATE (PF) 100 MCG/2ML IJ SOLN
INTRAMUSCULAR | Status: AC
  Filled 2015-08-04: qty 2

## 2015-08-04 MED ORDER — DEXAMETHASONE SODIUM PHOSPHATE 4 MG/ML IJ SOLN
INTRAMUSCULAR | Status: AC
  Filled 2015-08-04: qty 1

## 2015-08-04 MED ORDER — METOPROLOL TARTRATE 1 MG/ML IV SOLN
INTRAVENOUS | Status: AC
  Filled 2015-08-04: qty 5

## 2015-08-04 MED ORDER — POTASSIUM CHLORIDE CRYS ER 20 MEQ PO TBCR
20.0000 meq | EXTENDED_RELEASE_TABLET | Freq: Every day | ORAL | Status: DC
  Administered 2015-08-04 – 2015-08-05 (×2): 20 meq via ORAL
  Filled 2015-08-04 (×2): qty 1

## 2015-08-04 MED ORDER — MIDAZOLAM HCL 2 MG/2ML IJ SOLN
INTRAMUSCULAR | Status: AC
  Filled 2015-08-04: qty 4

## 2015-08-04 MED ORDER — LIDOCAINE VISCOUS 2 % MT SOLN
OROMUCOSAL | Status: AC
  Filled 2015-08-04: qty 15

## 2015-08-04 MED ORDER — LACTATED RINGERS IV SOLN
INTRAVENOUS | Status: DC
  Administered 2015-08-04: 07:00:00 via INTRAVENOUS

## 2015-08-04 MED ORDER — AMIODARONE LOAD VIA INFUSION: 150.0000 mg | Freq: Once | INTRAVENOUS | Status: DC

## 2015-08-04 MED ORDER — MIDAZOLAM HCL 2 MG/2ML IJ SOLN
INTRAMUSCULAR | Status: AC
  Filled 2015-08-04: qty 2

## 2015-08-04 MED ORDER — ONDANSETRON HCL 4 MG/2ML IJ SOLN: 4.0000 mg | Freq: Four times a day (QID) | INTRAMUSCULAR | Status: DC | PRN

## 2015-08-04 MED ORDER — ONDANSETRON HCL 4 MG/2ML IJ SOLN
4.0000 mg | Freq: Once | INTRAMUSCULAR | Status: DC | PRN
Start: 2015-08-04 — End: 2015-08-04

## 2015-08-04 MED ORDER — DULOXETINE HCL 60 MG PO CPEP
60.0000 mg | ORAL_CAPSULE | Freq: Every day | ORAL | Status: DC
  Administered 2015-08-04 – 2015-08-05 (×2): 60 mg via ORAL
  Filled 2015-08-04 (×2): qty 1

## 2015-08-04 MED ORDER — AMIODARONE HCL IN DEXTROSE 360-4.14 MG/200ML-% IV SOLN: 60.0000 mg/h | INTRAVENOUS | Status: DC

## 2015-08-04 MED ORDER — ONDANSETRON HCL 4 MG/2ML IJ SOLN
INTRAMUSCULAR | Status: AC
  Filled 2015-08-04: qty 2

## 2015-08-04 MED ORDER — MIDAZOLAM HCL 2 MG/2ML IJ SOLN
1.0000 mg | INTRAMUSCULAR | Status: DC | PRN
  Administered 2015-08-04: 2 mg via INTRAVENOUS

## 2015-08-04 MED ORDER — GLYCOPYRROLATE 0.2 MG/ML IJ SOLN
0.2000 mg | Freq: Once | INTRAMUSCULAR | Status: AC
  Administered 2015-08-04: 0.2 mg via INTRAVENOUS

## 2015-08-04 MED ORDER — INSULIN DETEMIR 100 UNIT/ML ~~LOC~~ SOLN
70.0000 [IU] | Freq: Every day | SUBCUTANEOUS | Status: DC
  Administered 2015-08-04: 70 [IU] via SUBCUTANEOUS
  Filled 2015-08-04 (×3): qty 0.7

## 2015-08-04 MED ORDER — STERILE WATER FOR IRRIGATION IR SOLN
Status: DC | PRN
  Administered 2015-08-04: 1000 mL

## 2015-08-04 MED ORDER — ACETAMINOPHEN 650 MG RE SUPP: 650.0000 mg | Freq: Four times a day (QID) | RECTAL | Status: DC | PRN

## 2015-08-04 MED ORDER — INSULIN ASPART 100 UNIT/ML ~~LOC~~ SOLN
100.0000 [IU] | Freq: Three times a day (TID) | SUBCUTANEOUS | Status: DC
  Administered 2015-08-05: 100 [IU] via SUBCUTANEOUS

## 2015-08-04 MED ORDER — PROPOFOL 500 MG/50ML IV EMUL
INTRAVENOUS | Status: DC | PRN
  Administered 2015-08-04: 08:00:00 via INTRAVENOUS
  Administered 2015-08-04: 100 ug/kg/min via INTRAVENOUS

## 2015-08-04 MED ORDER — METOPROLOL TARTRATE 1 MG/ML IV SOLN
INTRAVENOUS | Status: DC | PRN
  Administered 2015-08-04: 2 mg via INTRAVENOUS
  Administered 2015-08-04 (×2): 1 mg via INTRAVENOUS

## 2015-08-04 MED ORDER — AMIODARONE HCL IN DEXTROSE 360-4.14 MG/200ML-% IV SOLN: 30.0000 mg/h | INTRAVENOUS | Status: DC

## 2015-08-04 MED ORDER — ONDANSETRON HCL 4 MG PO TABS: 4.0000 mg | ORAL_TABLET | Freq: Four times a day (QID) | ORAL | Status: DC | PRN

## 2015-08-04 MED ORDER — ACETAMINOPHEN 325 MG PO TABS: 650.0000 mg | ORAL_TABLET | Freq: Four times a day (QID) | ORAL | Status: DC | PRN

## 2015-08-04 MED ORDER — INSULIN ASPART 100 UNIT/ML ~~LOC~~ SOLN: 100.0000 [IU] | Freq: Three times a day (TID) | SUBCUTANEOUS | Status: DC

## 2015-08-04 MED ORDER — DEXAMETHASONE SODIUM PHOSPHATE 4 MG/ML IJ SOLN
4.0000 mg | Freq: Once | INTRAMUSCULAR | Status: AC
  Administered 2015-08-04: 4 mg via INTRAVENOUS

## 2015-08-04 MED ORDER — ONDANSETRON HCL 4 MG/2ML IJ SOLN
4.0000 mg | Freq: Once | INTRAMUSCULAR | Status: AC
  Administered 2015-08-04: 4 mg via INTRAVENOUS

## 2015-08-04 MED ORDER — FENTANYL CITRATE (PF) 100 MCG/2ML IJ SOLN: 25.0000 ug | INTRAMUSCULAR | Status: DC | PRN

## 2015-08-04 MED ORDER — AMIODARONE HCL IN DEXTROSE 360-4.14 MG/200ML-% IV SOLN
60.0000 mg/h | INTRAVENOUS | Status: AC
  Administered 2015-08-04: 60 mg/h via INTRAVENOUS
  Filled 2015-08-04: qty 200

## 2015-08-04 MED ORDER — AMIODARONE HCL IN DEXTROSE 360-4.14 MG/200ML-% IV SOLN
30.0000 mg/h | INTRAVENOUS | Status: DC
Start: 2015-08-04 — End: 2015-08-05
  Administered 2015-08-04: 30 mg/h via INTRAVENOUS
  Filled 2015-08-04 (×5): qty 200

## 2015-08-04 MED ORDER — GLYCOPYRROLATE 0.2 MG/ML IJ SOLN
INTRAMUSCULAR | Status: AC
  Filled 2015-08-04: qty 1

## 2015-08-04 SURGICAL SUPPLY — 36 items
BALLN CRE LF 10-12 240X5.5 (BALLOONS)
BALLN CRE LF 10-12MM 240X5.5 (BALLOONS)
BALLN DILATOR CRE 12-15 240 (BALLOONS)
BALLN DILATOR CRE 15-18 240 (BALLOONS) IMPLANT
BALLN DILATOR CRE 18-20 240 (BALLOONS) IMPLANT
BALLN DILATOR CRE WIREGUIDE (BALLOONS)
BALLOON CRE LF 10-12 240X5.5 (BALLOONS) IMPLANT
BALLOON DILATOR CRE 12-15 240 (BALLOONS) IMPLANT
BALLOON DILATOR CRE WIREGUIDE (BALLOONS) IMPLANT
BLOCK BITE 60FR ADLT L/F BLUE (MISCELLANEOUS) ×2 IMPLANT
ELECT REM PT RETURN 9FT ADLT (ELECTROSURGICAL)
ELECTRODE REM PT RTRN 9FT ADLT (ELECTROSURGICAL) IMPLANT
FCP BXJMBJMB 240X2.8X (CUTTING FORCEPS)
FLOOR PAD 36X40 (MISCELLANEOUS) ×3
FORCEP COLD BIOPSY (CUTTING FORCEPS) IMPLANT
FORCEPS BIOP RAD 4 LRG CAP 4 (CUTTING FORCEPS) IMPLANT
FORCEPS BIOP RJ4 240 W/NDL (CUTTING FORCEPS)
FORCEPS BXJMBJMB 240X2.8X (CUTTING FORCEPS) IMPLANT
FORMALIN 10 PREFIL 20ML (MISCELLANEOUS) IMPLANT
INJECTOR/SNARE I SNARE (MISCELLANEOUS) IMPLANT
KIT ENDO PROCEDURE PEN (KITS) ×3 IMPLANT
MANIFOLD NEPTUNE II (INSTRUMENTS) ×3 IMPLANT
NDL SCLEROTHERAPY 25GX240 (NEEDLE) IMPLANT
NEEDLE SCLEROTHERAPY 25GX240 (NEEDLE) IMPLANT
PAD FLOOR 36X40 (MISCELLANEOUS) IMPLANT
PROBE APC STR FIRE (PROBE) IMPLANT
PROBE INJECTION GOLD (MISCELLANEOUS)
PROBE INJECTION GOLD 7FR (MISCELLANEOUS) IMPLANT
SNARE ROTATE MED OVAL 20MM (MISCELLANEOUS) IMPLANT
SNARE SHORT THROW 13M SML OVAL (MISCELLANEOUS) ×1 IMPLANT
SYR 50ML LL SCALE MARK (SYRINGE) ×2 IMPLANT
SYR INFLATE BILIARY GAUGE (MISCELLANEOUS) IMPLANT
SYR INFLATION 60ML (SYRINGE) IMPLANT
TRAP SPECIMEN MUCOUS 40CC (MISCELLANEOUS) IMPLANT
TUBING IRRIGATION ENDOGATOR (MISCELLANEOUS) ×2 IMPLANT
WATER STERILE IRR 1000ML POUR (IV SOLUTION) ×2 IMPLANT

## 2015-08-04 NOTE — H&P (Signed)
[name] is an 69 y.o. female.   Chief Complaint: Patient is here for EGD, ED and colonoscopy. HPI: Patient is 69 year old Caucasian female who has chronic GERD who now presents with intermittent solid food dysphagia. She feels heartburns well controlled with therapy. She also complains of intermittent right upper quadrant pain which is felt to be musculoskeletal. She has chronic ulcerative colitis. Last colonoscopy was in April 2019 and she had a sigmoidoscopy in April 2013 when she had flareup of UC and superadded C. difficile. She is presently doing well. She has intractable flatulence. She underwent hydrogen breath test 3 weeks ago and was within normal limits. She generally has 2-3 formed stools per day. She denies melena or rectal bleeding.  Past Medical History  Diagnosis Date  . GERD   . Osteoarthritis   . HYPERTENSION   . IDDM (insulin dependent diabetes mellitus) ([place])   . Ulcerative colitis ([facility])     Remicade infusion Q 8 weeks  . History of thrombocytopenia 12/2011  . 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 Dental bridge present     lower  . Anxiety   . Complication of anesthesia     Past Surgical History  Procedure Laterality Date  . Abdominal hysterectomy  1980    partial  . Breast reduction surgery  1994  . Rectocele repair  1990; 09/12/2006  . Carpal tunnel release Right 1996  . Knee arthroscopy Right 01/1999; 10/2000  . Hemilaminotomy lumbar spine Bilateral 09/07/1999    L4-5  . Cholecystectomy    . Flexible sigmoidoscopy  01/17/2012    Procedure: FLEXIBLE SIGMOIDOSCOPY;  Surgeon: Rogene Houston, MD;  [facility];  Service: Endoscopy;  Laterality: N/A;  . Bilateral salpingoophorectomy  02/10/2001  . Tarsal tunnel release  2002  . Ureterolysis Right 02/10/2001  . Lysis of adhesion  02/10/2001  . Carpal tunnel release Left 03/21/2003  . 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 Esophagogastroduodenoscopy (egd)  with esophageal dilation  12/02/2005  . Carpal tunnel release Right 05/04/2013    Procedure: CARPAL TUNNEL RELEASE;  Surgeon: Cammie Sickle., MD;  Location: [place];  Service: Orthopedics;  Laterality: Right;  . Carpal tunnel release Left 09/21/2013    Procedure: LEFT CARPAL TUNNEL RELEASE;  Surgeon: Cammie Sickle., MD;  Location: [place];  Service: Orthopedics;  Laterality: Left;  . Bacterial overgrowth test N/A 07/13/2015    Procedure: BACTERIAL OVERGROWTH TEST;  Surgeon: Rogene Houston, MD;  Location: [facility];  Service: Endoscopy;  Laterality: N/A;  730      Family History  Problem Relation Age of Onset  . Diabetes Mother   . Hypertension Mother   . Heart attack Father     Mid 70's  . Heart disease Father   . Lung disease Father     spot on lung; had lung surgery  . Alcohol abuse Other   . Hypertension Son   . Diabetes Son    Social History:  reports that she has never smoked. She has never used smokeless tobacco. She reports that she drinks alcohol. She reports that she does not use illicit drugs.  Allergies:  Allergies  Allergen Reactions  . Actos [Pioglitazone] Other (See Comments)    EDEMA   . Benzocaine-Menthol Swelling    SWELLING OF MOUTH  . Colesevelam Other (See Comments)    GI UPSET  .  Flagyl [Metronidazole Hcl] Other (See Comments)    DIAPHORESIS  . Metformin And Related Diarrhea  . Omeprazole Swelling    SWELLING OF TONGUE AND THROAT  . Shrimp [Shellfish Allergy] Itching    OF THROAT AND EARS  . Statins Other (See Comments)    HEART RACING  . Desipramine Hcl Itching, Nausea Only and Other (See Comments)    "swimmy" headed, ears itched   . Hydromorphone Itching  . Adhesive [Tape] Other (See Comments)    SKIN IRRITATION AND BRUISING  . Nisoldipine Itching  . Percocet [Oxycodone-Acetaminophen] Itching    Medications Prior to Admission  Medication Sig Dispense Refill  . azelastine (ASTELIN) 0.1 %  nasal spray Place 2 sprays into both nostrils 2 (two) times daily. 30 mL 1  . cromolyn (OPTICROM) 4 % ophthalmic solution Place 1 drop into both eyes as needed.     . DULoxetine (CYMBALTA) 60 MG capsule Take 60 mg by mouth daily.     . furosemide (LASIX) 40 MG tablet Take 1 tablet (40 mg total) by mouth daily. 90 tablet 1  . insulin aspart (NOVOLOG) 100 UNIT/ML FlexPen Inject 130 Units into the skin 3 (three) times daily with meals. 40 pen 2  . Insulin Detemir (LEVEMIR) 100 UNIT/ML Pen Inject 70 Units into the skin at bedtime. 130 mL 3  . lactase (LACTAID) 3000 UNITS tablet Take 3,000 Units by mouth as needed.     . loperamide (IMODIUM) 2 MG capsule Take 2 mg by mouth 3 (three) times daily as needed for diarrhea or loose stools.     . meclizine (ANTIVERT) 25 MG tablet Take 1 tablet (25 mg total) by mouth 3 (three) times daily as needed for dizziness or nausea. 90 tablet 0  . metoprolol succinate (TOPROL-XL) 100 MG 24 hr tablet Take 1 tablet (100 mg total) by mouth 2 (two) times daily. 180 tablet 3  . NEXIUM 40 MG capsule Take 40 mg by mouth daily.     . potassium chloride (KLOR-CON M10) 10 MEQ tablet Take 2 tablets (20 mEq total) by mouth daily. 180 tablet 3  . Saccharomyces boulardii (FLORASTOR PO) Take 1 capsule by mouth daily.     Marland Kitchen inFLIXimab (REMICADE) 100 MG injection Inject into the vein every 8 (eight) weeks.       Results for orders placed or performed during the hospital encounter of 08/04/15 (from the past 48 hour(s))  Glucose, capillary     Status: None   Collection Time: 08/04/15  6:54 AM  Result Value Ref Range   Glucose-Capillary 86 65 - 99 mg/dL   No results found.  ROS  Blood pressure 136/71, pulse 71, temperature 98.1 F (36.7 C), temperature source Oral, resp. rate 18, SpO2 95 %. Physical Exam  Constitutional: She appears well-developed and well-nourished.  HENT:  Mouth/Throat: Oropharynx is clear and moist.  Eyes: Conjunctivae are normal. No scleral icterus.   Neck: No thyromegaly present.  Cardiovascular: Normal rate, regular rhythm and normal heart sounds.   No murmur heard. Respiratory: Effort normal and breath sounds normal.  GI: Soft. She exhibits no distension and no mass. There is no tenderness. There is no rebound.  Musculoskeletal: She exhibits no edema.  Lymphadenopathy:    She has no cervical adenopathy.  Neurological: She is alert.  Skin: Skin is warm and dry.     Assessment/Plan Solid food dysphagia in patient with chronic GERD. Chronic ulcerative colitis. EGD with ED. Surveillance colonoscopy. These procedures would be performed under [facility].  Roda Lauture  U 08/04/2015, 7:21 AM

## 2015-08-04 NOTE — Anesthesia Postprocedure Evaluation (Signed)
  Anesthesia Post-op Note  Patient: [name]  Procedure(s) Performed: Procedure(s) with comments: ESOPHAGOGASTRODUODENOSCOPY (EGD) WITH PROPOFOL (N/A) - procedure 1 ESOPHAGEAL DILATION (N/A) - [place] 56, no mucousal disruption COLONOSCOPY WITH PROPOFOL (N/A) - cecum time in  0820   time out  0827    total time 7 minutes  Patient Location: PACU  Anesthesia Type:MAC  Level of Consciousness: awake, alert , oriented and patient cooperative  Airway and Oxygen Therapy: Patient Spontanous Breathing and Patient connected to face mask oxygen  Post-op Pain: none  Post-op Assessment: Post-op Vital signs reviewed, PATIENT'S CARDIOVASCULAR STATUS UNSTABLE, Respiratory Function Stable, Patent Airway, No signs of Nausea or vomiting and Pain level controlled              Post-op Vital Signs: Reviewed and stable  Last Vitals:  Filed Vitals:   08/04/15 0900  BP:   Pulse: 129  Temp:   Resp: 24    Complications: cardiovascular complications .Marland KitchenMarland Kitchenpacu ekg shows new onset a-fib,cardiology consult ordered.  VSS

## 2015-08-04 NOTE — Progress Notes (Signed)
Awake. Swallowing without difficulty. Diet coke given to drink.

## 2015-08-04 NOTE — Progress Notes (Signed)
EKG obtained

## 2015-08-04 NOTE — Progress Notes (Signed)
Dr Laural [name] in to talk with pt.

## 2015-08-04 NOTE — Progress Notes (Signed)
Olive Bass FNP at bedside to check pt. Orders given.

## 2015-08-04 NOTE — Consult Note (Addendum)
CARDIOLOGY CONSULT NOTE   Patient ID: ARONDA BURFORD MRN: [id number] DOB/AGE: [birth year] 69 y.o.  Admit Date: 08/04/2015 Referring Physician: Laural [name]  Primary Physician: [name], MD Consulting Cardiologist: Carlyle Dolly MD Primary Cardiologist: Stanford Breed Reason for Consultation: Afib RVR  Clinical Summary Ms. Pint is a 69 y.o.female with known history of hypertension, diabetes, GERD, thrombocytopnea and neuralgia pain who was in Day Surgery for EDG due to dysphagia. She had esophageal dilation and was in recovery when she began to have elevated HR of 150 bpm. She was given IV metoprolol 4 mg with no improvement in HR. EKG demonstrated atrial fib with RVR rate of 135 bpm. We are asked to see for recommendations. There was no biopsy done during EDG.   Patient states she has been seen in the past by [facility], had a stress test and was placed on metoprolol 100 mg BID due to frequent PVCs and PAC's noted during test. She has not followed up with cardiology since that time.    She denies chest pain or dyspnea. She has been having a frequent cough which is dry.   Allergies  Allergen Reactions  . Actos [Pioglitazone] Other (See Comments)    EDEMA   . Benzocaine-Menthol Swelling    SWELLING OF MOUTH  . Colesevelam Other (See Comments)    GI UPSET  . Flagyl [Metronidazole Hcl] Other (See Comments)    DIAPHORESIS  . Metformin And Related Diarrhea  . Omeprazole Swelling    SWELLING OF TONGUE AND THROAT  . Shrimp [Shellfish Allergy] Itching    OF THROAT AND EARS  . Statins Other (See Comments)    HEART RACING  . Desipramine Hcl Itching, Nausea Only and Other (See Comments)    "swimmy" headed, ears itched   . Hydromorphone Itching  . Adhesive [Tape] Other (See Comments)    SKIN IRRITATION AND BRUISING  . Nisoldipine Itching  . Percocet [Oxycodone-Acetaminophen] Itching    Medications Scheduled Medications: . amiodarone  150 mg Intravenous Once     Infusions: . amiodarone    . amiodarone    . lactated ringers 75 mL/hr at 08/04/15 0721    PRN Medications: fentaNYL (SUBLIMAZE) injection, midazolam, ondansetron (ZOFRAN) IV   Past Medical History  Diagnosis Date  . GERD   . Osteoarthritis   . HYPERTENSION   . IDDM (insulin dependent diabetes mellitus) ([place])   . Ulcerative colitis (HCC)     Remicade infusion Q 8 weeks  . History of thrombocytopenia 12/2011  . 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 Dental bridge present     lower  . Anxiety   . Complication of anesthesia     Past Surgical History  Procedure Laterality Date  . Abdominal hysterectomy  1980    partial  . Breast reduction surgery  1994  . Rectocele repair  1990; 09/12/2006  . Carpal tunnel release Right 1996  . Knee arthroscopy Right 01/1999; 10/2000  . Hemilaminotomy lumbar spine Bilateral 09/07/1999    L4-5  . Cholecystectomy    . Flexible sigmoidoscopy  01/17/2012    Procedure: FLEXIBLE SIGMOIDOSCOPY;  Surgeon: Rogene Houston, MD;  [facility];  Service: Endoscopy;  Laterality: N/A;  . Bilateral salpingoophorectomy  02/10/2001  . Tarsal tunnel release  2002  . Ureterolysis Right 02/10/2001  . Lysis of adhesion  02/10/2001  . Carpal tunnel release Left 03/21/2003  . 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  Esophagogastroduodenoscopy (egd) with esophageal dilation  12/02/2005  . Carpal tunnel release Right 05/04/2013    Procedure: CARPAL TUNNEL RELEASE;  Surgeon: Cammie Sickle., MD;  Location: [place];  Service: Orthopedics;  Laterality: Right;  . Carpal tunnel release Left 09/21/2013    Procedure: LEFT CARPAL TUNNEL RELEASE;  Surgeon: Cammie Sickle., MD;  Location: [place];  Service: Orthopedics;  Laterality: Left;  . Bacterial overgrowth test N/A 07/13/2015    Procedure: BACTERIAL OVERGROWTH TEST;  Surgeon: Rogene Houston, MD;  [facility];   Service: Endoscopy;  Laterality: N/A;  730      Family History  Problem Relation Age of Onset  . Diabetes Mother   . Hypertension Mother   . Heart attack Father     Mid 49's  . Heart disease Father   . Lung disease Father     spot on lung; had lung surgery  . Alcohol abuse Other   . Hypertension Son   . Diabetes Son     Social History Ms. Alfredo reports that she has never smoked. She has never used smokeless tobacco. Ms. Gassett reports that she drinks alcohol.  Review of Systems Complete review of systems are found to be negative unless outlined in H&P above.  Physical Examination Blood pressure 96/55, pulse 129, temperature 98 F (36.7 C), temperature source Oral, resp. rate 24, SpO2 100 %.  Intake/Output Summary (Last 24 hours) at 08/04/15 0924 Last data filed at 08/04/15 0830  Gross per 24 hour  Intake    700 ml  Output      0 ml  Net    700 ml    Telemetry: Atrial fib with RVR rates 130-140 bpm.   GEN: Anxious no acute distress HEENT: Conjunctiva and lids normal, oropharynx clear with moist mucosa. Neck: Supple, no elevated JVP or carotid bruits, no thyromegaly. Lungs: Clear to auscultation, nonlabored breathing at rest. Cardiac: IRRegular rate and rhythm tachycardic, no S3 or significant systolic murmur, no pericardial rub. Abdomen: Soft, nontender, no hepatomegaly, bowel sounds present, no guarding or rebound. Extremities: No pitting edema, distal pulses 2+. Skin: Warm and dry. Musculoskeletal: No kyphosis. Neuropsychiatric: Alert and oriented x3, affect grossly appropriate.  Prior Cardiac Testing/Procedures 1. Echocardiogram 06/18/2010 Study Conclusions HPI and indications: Resting chest pain  - Probably normal study with no definite wall motion abnormalities developing with stress. However, overall function did not increase as much as I would have expected. If strong suspicion for coronary disease, would suggest followup myoview or coronary CT  angiogram.  2.Stress Echo 06/18/2010 Stress results: Maximal heart rate during stress was 139bpm (87% of maximal predicted heart rate). The maximal predicted heart rate was 158bpm. The target heart rate was achieved. The heart rate response to stress was normal. There was a normal resting blood pressure with an appropriate response to stress. The rate-pressure product for the peak heart rate and blood pressure was 20532m Hg/min. The patient experienced no chest pain during stress. Baseline:  - LV size was normal. - LV global systolic function was normal. - Normal wall motion; no LV regional wall motion abnormalities Stress ECG: No ST changes to suggest ischemia. FrequentPACs, PVCsin the recovery period Baseline:  - LV size was normal. - LV global systolic function was normal. - Normal wall motion; no LV regional wall motion abnormalities   Lab Results  Basic Metabolic Panel: No results for input(s): NA, K, CL, CO2, GLUCOSE, BUN, CREATININE, CALCIUM, MG, PHOS in the  last 168 hours.  Liver Function Tests: No results for input(s): AST, ALT, ALKPHOS, BILITOT, PROT, ALBUMIN in the last 168 hours.  CBC: No results for input(s): WBC, NEUTROABS, HGB, HCT, MCV, PLT in the last 168 hours.  Cardiac Enzymes: No results for input(s): CKTOTAL, CKMB, CKMBINDEX, TROPONINI in the last 168 hours.  BNP: Invalid input(s): POCBNP   Radiology: No results found.   ECG: Atrial fib with RVR   Impression and Recommendations 1. New Onset Atrial fib with RVR: Did not respond to lopressor IV. Start amiodarone bolus, loading gtt. Will start heparin for CHADS VASC Score of 4 (female, age, diabetes and hypertension). Check TSH, CBC, troponin,  BMET, and get echo once HR is better controlled. Recommend admission to ICU for close monitoring. I have spoken to Dr. Laural [name] who is arranging admission.   CVRF: FH, Diabetes, Hypertension. May need ischemic work up. She did stop breathing  just prior to EDG, sats dropped to 19 and appeared to be apneic per Dr. Laural [name].. May need sleep study.   2. Hypertension: Hypotensive with rapid HR but hemodynamically stable. She was given 1 liter of IV fluid with BP of 108/60 on exam. Will hold all antihypertensive medications. Would continue IV fluids but watch carefullly for CHF in the setting of atrial fib. She has taken one dose of metoprolol 100 mg prior to EDG.   3. Diabetes: Per Hospitalist service.      Signed: Phill Myron. Lawrence NP [place]  08/04/2015, 9:24 AM Co-Sign MD  Patient seen and discussed with NP Purcell Nails, I agree with her documentation. 69 yo female with history of palpitations but no previously documented significant arrhythmias, HTN, DM2, GERD admitted with new onset afib from PACU s/p EGD today where she had esoph dilatation. Patient initially afib with RVR, did not respond to IV metop. Developed some transient hypotenstion. Started on amio gtt, bp improved with IVFs.  We will plan for 24 hour IV load of amio, followed by 489m bid x 7 days, then 2017mbid x 2 weeks. I would not plan for long term amio for her, and would look to d/c as outpatient and manage with just AV nodal agents only if she is doing well. . If bp tolerates would restart her Toprol XL tomorrow but at lower dose of 10022maily since amio also has beta blocker properties. Her CHADS2Vasc score is  4 (39ge x1, gender, DM2, HTN), if ok with GI would start eliquis 5mg18md. She has long history of palpitations, unknown duration of afib and thus will not cardiovert. We will varify with Dr RehmLaural Goldenok to start anticoag. F/u echo. Normal TSH and K, will add on Mg to labs.   Please note I reviwed all EKG and tele strips from PACU that confirm afib. These have not been scanned into epic as of yet, I have asked nursing staff to make sure they are as it is the only documented afib for her as she is now back in NSR.   J BrZandra Abts

## 2015-08-04 NOTE — H&P (Signed)
[facility] History and Physical  HUGH GARROW NFA:[id number] DOB: 1945/12/09 DOA: 08/04/2015  Referring physician: Rogene Houston, MD PCP: [name], MD   Chief Complaint: Atrial fibrillation with RVR  HPI: [name] is a 68 y.o. female with a hx of DM type 2, HTN, and GERD that presented with new onset atrial fibrillation with RVR. Patient was in the recovery room of Day Surgery s/p an EGD due to dysphagia when she began to have an elevated HR of 150bpm. She was given IV metoprolol 4 mg with no improvement in HR. EKG performed at that time revealed atrial fibrillation with RVR rate of 135 bpm. She was also noted to be hypotensive. Cardiology consulted on her prior to admission and began Amiodarone gtt, Heparin and IVF. She will be admitted for further management.   Per cardiology note, patient has been seen in the past by Dr.Crenshaw, had a stress test and was placed on metoprolol 100 mg BID due to frequent PVCs and PAC's noted during test. She has not followed up with cardiology since that time.  At this time, she is not having chest pain, shortness of breath, palpitations. She has had long-standing dysphagia and has frequently required esophageal dilatations which appear to help her symptoms. She's not had any nausea, lightheadedness or any other symptoms at this time.  Review of Systems:  Pertinent positives as per HPI, otherwise negative   Past Medical History  Diagnosis Date  . GERD   . Osteoarthritis   . HYPERTENSION   . IDDM (insulin dependent diabetes mellitus) ([place])   . Ulcerative colitis (HCC)     Remicade infusion Q 8 weeks  . History of thrombocytopenia 12/2011  . 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 Dental bridge present     lower  . Anxiety   . Complication of anesthesia    Past Surgical History  Procedure Laterality Date  . Abdominal hysterectomy  1980    partial  . Breast reduction surgery  1994  . Rectocele repair   1990; 09/12/2006  . Carpal tunnel release Right 1996  . Knee arthroscopy Right 01/1999; 10/2000  . Hemilaminotomy lumbar spine Bilateral 09/07/1999    L4-5  . Cholecystectomy    . Flexible sigmoidoscopy  01/17/2012    Procedure: FLEXIBLE SIGMOIDOSCOPY;  Surgeon: Rogene Houston, MD;  [facility];  Service: Endoscopy;  Laterality: N/A;  . Bilateral salpingoophorectomy  02/10/2001  . Tarsal tunnel release  2002  . Ureterolysis Right 02/10/2001  . Lysis of adhesion  02/10/2001  . Carpal tunnel release Left 03/21/2003  . 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 Esophagogastroduodenoscopy (egd) with esophageal dilation  12/02/2005  . Carpal tunnel release Right 05/04/2013    Procedure: CARPAL TUNNEL RELEASE;  Surgeon: Cammie Sickle., MD;  Location: [place];  Service: Orthopedics;  Laterality: Right;  . Carpal tunnel release Left 09/21/2013    Procedure: LEFT CARPAL TUNNEL RELEASE;  Surgeon: Cammie Sickle., MD;  Location: [place];  Service: Orthopedics;  Laterality: Left;  . Bacterial overgrowth test N/A 07/13/2015    Procedure: BACTERIAL OVERGROWTH TEST;  Surgeon: Rogene Houston, MD;  [facility];  Service: Endoscopy;  Laterality: N/A;  730     Social History:  reports that she has never smoked. She has never used smokeless tobacco. She reports that she drinks alcohol. She reports that  she does not use illicit drugs.  Allergies  Allergen Reactions  . Actos [Pioglitazone] Other (See Comments)    EDEMA   . Benzocaine-Menthol Swelling    SWELLING OF MOUTH  . Colesevelam Other (See Comments)    GI UPSET  . Flagyl [Metronidazole Hcl] Other (See Comments)    DIAPHORESIS  . Metformin And Related Diarrhea  . Omeprazole Swelling    SWELLING OF TONGUE AND THROAT  . Shrimp [Shellfish Allergy] Itching    OF THROAT AND EARS  . Statins Other (See Comments)    HEART RACING  . Desipramine Hcl Itching, Nausea  Only and Other (See Comments)    "swimmy" headed, ears itched   . Hydromorphone Itching  . Adhesive [Tape] Other (See Comments)    SKIN IRRITATION AND BRUISING  . Nisoldipine Itching  . Percocet [Oxycodone-Acetaminophen] Itching    Family History  Problem Relation Age of Onset  . Diabetes Mother   . Hypertension Mother   . Heart attack Father     Mid 42's  . Heart disease Father   . Lung disease Father     spot on lung; had lung surgery  . Alcohol abuse Other   . Hypertension Son   . Diabetes Son     Prior to Admission medications   Medication Sig Start Date End Date Taking? Authorizing Provider  azelastine (ASTELIN) 0.1 % nasal spray Place 2 sprays into both nostrils 2 (two) times daily. 06/28/15  Yes Rowe Clack, MD  cromolyn (OPTICROM) 4 % ophthalmic solution Place 1 drop into both eyes as needed.  06/09/15  Yes Historical Provider, MD  DULoxetine (CYMBALTA) 60 MG capsule Take 60 mg by mouth daily.  03/10/15  Yes Historical Provider, MD  furosemide (LASIX) 40 MG tablet Take 1 tablet (40 mg total) by mouth daily. 05/11/15  Yes Rowe Clack, MD  insulin aspart (NOVOLOG) 100 UNIT/ML FlexPen Inject 130 Units into the skin 3 (three) times daily with meals. 02/23/15  Yes Rowe Clack, MD  Insulin Detemir (LEVEMIR) 100 UNIT/ML Pen Inject 70 Units into the skin at bedtime. 02/23/15  Yes Rowe Clack, MD  lactase (LACTAID) 3000 UNITS tablet Take 3,000 Units by mouth as needed.    Yes Historical Provider, MD  loperamide (IMODIUM) 2 MG capsule Take 2 mg by mouth 3 (three) times daily as needed for diarrhea or loose stools.    Yes Historical Provider, MD  meclizine (ANTIVERT) 25 MG tablet Take 1 tablet (25 mg total) by mouth 3 (three) times daily as needed for dizziness or nausea. 12/09/13  Yes Rowe Clack, MD  metoprolol succinate (TOPROL-XL) 100 MG 24 hr tablet Take 1 tablet (100 mg total) by mouth 2 (two) times daily. 02/23/15  Yes Rowe Clack, MD  NEXIUM  40 MG capsule Take 40 mg by mouth daily.  06/07/15  Yes Historical Provider, MD  potassium chloride (KLOR-CON M10) 10 MEQ tablet Take 2 tablets (20 mEq total) by mouth daily. 02/23/15  Yes Rowe Clack, MD  Saccharomyces boulardii (FLORASTOR PO) Take 1 capsule by mouth daily.    Yes Historical Provider, MD  inFLIXimab (REMICADE) 100 MG injection Inject into the vein every 8 (eight) weeks.     Historical Provider, MD   Physical Exam: Filed Vitals:   08/04/15 1115 08/04/15 1200 08/04/15 1400 08/04/15 1635  BP:   128/64   Pulse: 78  69   Temp:    97.8 F (36.6 C)  TempSrc:    Oral  Resp: 23  24   Height:  5' 2"  (1.575 m)    Weight:  95.9 kg (211 lb 6.7 oz)    SpO2: 100%  96%     Wt Readings from Last 3 Encounters:  08/04/15 95.9 kg (211 lb 6.7 oz)  07/31/15 93.441 kg (206 lb)  07/11/15 94.711 kg (208 lb 12.8 oz)    General: NAD, looks comfortable HEENT: mucous membranes are moist Cardiovascular: RRR, S1, S2  Respiratory: clear bilaterally, No wheezing, rales or rhonchi Abdomen: soft, non tender, no distention , bowel sounds normal Musculoskeletal: No edema b/l Neurologic: grossly intact, non focal Psychiatric: normal affect, cooperative with exam Skin: no rashes identified on my limited exam         CBG:  Recent Labs Lab 08/04/15 0654 08/04/15 0856 08/04/15 1740  GLUCAP 86 101* 292*    Radiological Exams on Admission: No results found.  EKG: Independently reviewed. Sinus rhythm  Assessment/Plan Principal Problem:   Atrial fibrillation with RVR (HCC) Active Problems:   DM (diabetes mellitus), type 2, uncontrolled (HCC)   Essential hypertension   GERD   Ulcerative colitis ([place])   Dyslipidemia   Morbidly obese ([place])   1. Atrial fibrillation with RVR. Now in NSR following Amiodarone. We'll continue on amiodarone infusion for loading dose and transitioned to oral regimen tomorrow. If blood pressure stable, can consider restarting beta blockers tomorrow.  She's been anticoagulated on Eliquis. Appreciate cardiology's assistance.  2. Essential Hypertension. Initially hypotensive on admission however BP are stable following IVF. Will hold beta blockers for now. Can resume tomorrow blood pressures are stable. 3. DM type 2, stable. Continue SSI. She is on very large doses of basal and bolus insulin. We'll continue her home regimen for now. 4. Possible OSA,  will need outpatient follow up and outpatient sleep study 5. GERD, continue PPI. 6. Ulcerative colitis. Appears to be in remission at this time.   Code Status: full code DVT Prophylaxis: on eliquis Family Communication: discussed with patient and family at the bedside Disposition Plan: Anticipate discharge within 1-2 days.  Time spent: 55 minutes  [employer]. MD [facility] Pager [phone]

## 2015-08-04 NOTE — Progress Notes (Signed)
Monitor displays A-fib.

## 2015-08-04 NOTE — Progress Notes (Signed)
Dr Patsey Berthold at bedside. Viewed EKG. New onset A-fib. Will notify Dr Laural [name].

## 2015-08-04 NOTE — Progress Notes (Signed)
Labs drawn and sent to lab for results.

## 2015-08-04 NOTE — Progress Notes (Signed)
I asked the pt about her large dose of meal coverage novolog, and she stated that she does in fact use 130 units of novolog for meal coverage at home.  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  She also stated that she was previously seen by an endocrinologist, but she no longer sees him.  Currently, her PCP is managing her DM.

## 2015-08-04 NOTE — Op Note (Signed)
EGD ED AND COLONOSCOPY PROCEDURE REPORT  PATIENT:  [name]  MR#:  [id number] Birthdate:  November 15, 1945, 69 y.o., female Endoscopist:  Dr. Rogene Houston, MD  Procedure Date: 08/04/2015  Procedure:   EGD ED & Colonoscopy  Indications:  Patient is 69 year old Caucasian female was chronic GERD and now complains of intermittent solid food dysphagia. Heartburn is well controlled with therapy. She has chronic ulcerative colitis and is due for surveillance colonoscopy.            Informed Consent:  The risks, benefits, alternatives & imponderables which include, but are not limited to, bleeding, infection, perforation, drug reaction and potential missed lesion have been reviewed.  The potential for biopsy, lesion removal, esophageal dilation, etc. have also been discussed.  Questions have been answered.  All parties agreeable.  Please see history & physical in medical record for more information.  Medications:  Cetacaine spray topically for oropharyngeal anesthesia Monitored anesthesia care.  EGD  Description of procedure:  The endoscope was introduced through the mouth and advanced to the second portion of the duodenum without difficulty or limitations. The mucosal surfaces were surveyed very carefully during advancement of the scope and upon withdrawal.  Findings:  Esophagus:  Mucosa of the esophagus was normal and GE junction was unremarkable without ring or stricture formation. GEJ:  35 cm Hiatus:  38 cm Stomach:  Stomach was empty and distended very well with insufflation. Folds in the proximal stomach were normal. Examination of mucosa at gastric body, antrum, pyloric channel and angularis was normal. Four small hyperplastic appearing polyps noted at gastric fundus. These were left alone. Duodenum:  Normal bulbar and post bulbar mucosa.  Therapeutic/Diagnostic Maneuvers Performed:   Esophagus was dilated by passing 56 [place] Maloney dilator to full insertion. As the dilator was  withdrawn endoscope was passed again and no disruption noted to esophageal mucosa.   COLONOSCOPY Description of procedure:  After a digital rectal exam was performed, that colonoscope was advanced from the anus through the rectum and colon to the area of the cecum, ileocecal valve and appendiceal orifice. The cecum was deeply intubated. These structures were well-seen and photographed for the record. From the level of the cecum and ileocecal valve, the scope was slowly and cautiously withdrawn. The mucosal surfaces were carefully surveyed utilizing scope tip to flexion to facilitate fold flattening as needed. The scope was pulled down into the rectum where a thorough exam including retroflexion was performed.  Findings:   Prep excellent. Edema noted to periappendiceal mucosa otherwise cecal mucosa was normal. Mucosa of rest of the colon and rectum was normal. Small hemorrhoids noted below the dentate line.  Therapeutic/Diagnostic Maneuvers Performed:  None  Complications:  none  Cecal Withdrawal Time:  8 minutes  Impression:  EGD findings: Small sliding hiatal hernia without evidence of erosive esophagitis ring or stricture formation. Four small hyperplastic appearing polyps at gastric fundus and these were left alone. Esophagus dilated by passing 56 [place] Maloney dilator but no mucosal disruption noted.  Colonoscopy findings: Colitis is in remission. Focal periappendiceal edema noted without erosions or ulceration. Small external hemorrhoids.  Comment; Patient appears to have upper airway obstruction and needs to be evaluated for obstructive sleep apnea. If she remains with dysphagia will consider esophageal manometry.   Recommendations:  Standard instructions given. Patient advised to make appointment with Dr. [name] for evaluation for obstructive sleep apnea. Office visit in 6 months.  REHMAN,NAJEEB U  08/04/2015 8:42 AM  CC: Dr. [name], MD &  Dr. No  ref. provider found

## 2015-08-04 NOTE — Progress Notes (Signed)
Cardiac monitor displays NSR. Strip recorded.

## 2015-08-04 NOTE — Progress Notes (Signed)
Dr Patsey Berthold at bedside to talk with pt. Informed pt will have cardiologist to see. Voiced understanding.

## 2015-08-04 NOTE — Progress Notes (Signed)
*  PRELIMINARY RESULTS* Echocardiogram 2D Echocardiogram has been performed.  Leavy Cella 08/04/2015, 3:20 PM

## 2015-08-04 NOTE — Discharge Instructions (Signed)
Patient is to be admitted for management of new onset A. fib. Please discuss with Dr. [name] about evaluation for obstructive sleep apnea. Office visit in 6 months.  Information on my medicine - ELIQUIS (apixaban)  This medication education was reviewed with me or my healthcare representative as part of my discharge preparation.  The pharmacist that spoke with me during my hospital stay was:  Pricilla Larsson, [facility]  Why was Eliquis prescribed for you? Eliquis was prescribed for you to reduce the risk of a blood clot forming that can cause a stroke if you have a medical condition called atrial fibrillation (a type of irregular heartbeat).  What do You need to know about Eliquis ? Take your Eliquis TWICE DAILY - one tablet in the morning and one tablet in the evening with or without food. If you have difficulty swallowing the tablet whole please discuss with your pharmacist how to take the medication safely.  Take Eliquis exactly as prescribed by your doctor and DO NOT stop taking Eliquis without talking to the doctor who prescribed the medication.  Stopping may increase your risk of developing a stroke.  Refill your prescription before you run out.  After discharge, you should have regular check-up appointments with your healthcare provider that is prescribing your Eliquis.  In the future your dose may need to be changed if your kidney function or weight changes by a significant amount or as you get older.  What do you do if you miss a dose? If you miss a dose, take it as soon as you remember on the same day and resume taking twice daily.  Do not take more than one dose of ELIQUIS at the same time to make up a missed dose.  Important Safety Information A possible side effect of Eliquis is bleeding. You should call your healthcare provider right away if you experience any of the following: ? Bleeding from an injury or your nose that does not stop. ? Unusual colored urine (red  or dark brown) or unusual colored stools (red or black). ? Unusual bruising for unknown reasons. ? A serious fall or if you hit your head (even if there is no bleeding).  Some medicines may interact with Eliquis and might increase your risk of bleeding or clotting while on Eliquis. To help avoid this, consult your healthcare provider or pharmacist prior to using any new prescription or non-prescription medications, including herbals, vitamins, non-steroidal anti-inflammatory drugs (NSAIDs) and supplements.  This website has more information on Eliquis (apixaban): http://www.eliquis.com/eliquis/home

## 2015-08-04 NOTE — Transfer of Care (Signed)
Immediate Anesthesia Transfer of Care Note  Patient: [name]  Procedure(s) Performed: Procedure(s) with comments: ESOPHAGOGASTRODUODENOSCOPY (EGD) WITH PROPOFOL (N/A) - procedure 1 ESOPHAGEAL DILATION (N/A) - [place] 57, no mucousal disruption COLONOSCOPY WITH PROPOFOL (N/A) - cecum time in  0820   time out  0827    total time 7 minutes  Patient Location: PACU  Anesthesia Type:MAC  Level of Consciousness: awake, alert  and patient cooperative  Airway & Oxygen Therapy: Patient Spontanous Breathing and Patient connected to face mask oxygen  Post-op Assessment: Report given to RN, Post -op Vital signs reviewed and stable and Patient moving all extremities  Post vital signs: Reviewed and stable  Last Vitals:  Filed Vitals:   08/04/15 0730  BP: 135/79  Pulse:   Temp:   Resp: 25    Complications: No apparent anesthesia complications

## 2015-08-04 NOTE — Progress Notes (Signed)
Dr Laural [name] talked with husband.

## 2015-08-04 NOTE — Anesthesia Preprocedure Evaluation (Signed)
Anesthesia Evaluation  Patient identified by MRN, date of birth, ID band Patient awake    Reviewed: Allergy & Precautions, H&P , NPO status , Patient's Chart, lab work & pertinent test results  History of Anesthesia Complications (+) PONV and history of anesthetic complications  Airway Mallampati: II  TM Distance: >3 FB Neck ROM: Full    Dental  (+) Teeth Intact   Pulmonary neg pulmonary ROS,    breath sounds clear to auscultation       Cardiovascular hypertension, Pt. on medications  Rhythm:Regular Rate:Normal     Neuro/Psych PSYCHIATRIC DISORDERS Anxiety  Neuromuscular disease ( Peripheral neuropathy )    GI/Hepatic PUD, GERD  Medicated and Controlled,  Endo/Other  diabetes, Well Controlled, Type 2, Insulin Dependent  Renal/GU      Musculoskeletal   Abdominal   Peds  Hematology   Anesthesia Other Findings   Reproductive/Obstetrics                             Anesthesia Physical Anesthesia Plan  ASA: III  Anesthesia Plan: MAC   Post-op Pain Management:    Induction: Intravenous  Airway Management Planned: Simple Face Mask  Additional Equipment:   Intra-op Plan:   Post-operative Plan:   Informed Consent: I have reviewed the patients History and Physical, chart, labs and discussed the procedure including the risks, benefits and alternatives for the proposed anesthesia with the patient or authorized representative who has indicated his/her understanding and acceptance.     Plan Discussed with:   Anesthesia Plan Comments:         Anesthesia Quick Evaluation                                  Anesthesia Evaluation  Patient identified by MRN, date of birth, ID band Patient awake    Reviewed: Allergy & Precautions, H&P , NPO status , Patient's Chart, lab work & pertinent test results  History of Anesthesia Complications (+) PONV  Airway Mallampati: I TM Distance:  >3 FB Neck ROM: Full    Dental   Pulmonary          Cardiovascular hypertension, Pt. on medications     Neuro/Psych Anxiety    GI/Hepatic GERD-  Medicated and Controlled,  Endo/Other  diabetes, Well Controlled, Type 2, Insulin Dependent  Renal/GU      Musculoskeletal   Abdominal   Peds  Hematology   Anesthesia Other Findings   Reproductive/Obstetrics                           Anesthesia Physical Anesthesia Plan  ASA: III  Anesthesia Plan: General   Post-op Pain Management:    Induction: Intravenous  Airway Management Planned: LMA  Additional Equipment:   Intra-op Plan:   Post-operative Plan: Extubation in OR  Informed Consent:   Plan Discussed with: CRNA and Surgeon  Anesthesia Plan Comments:         Anesthesia Quick Evaluation

## 2015-08-05 ENCOUNTER — Encounter (HOSPITAL_COMMUNITY): Payer: Self-pay

## 2015-08-05 DIAGNOSIS — I4891 Unspecified atrial fibrillation: Secondary | ICD-10-CM | POA: Diagnosis not present

## 2015-08-05 DIAGNOSIS — I10 Essential (primary) hypertension: Secondary | ICD-10-CM | POA: Diagnosis not present

## 2015-08-05 DIAGNOSIS — K5190 Ulcerative colitis, unspecified, without complications: Secondary | ICD-10-CM | POA: Diagnosis not present

## 2015-08-05 DIAGNOSIS — R1310 Dysphagia, unspecified: Secondary | ICD-10-CM | POA: Diagnosis not present

## 2015-08-05 DIAGNOSIS — I959 Hypotension, unspecified: Secondary | ICD-10-CM | POA: Diagnosis not present

## 2015-08-05 DIAGNOSIS — K51919 Ulcerative colitis, unspecified with unspecified complications: Secondary | ICD-10-CM

## 2015-08-05 DIAGNOSIS — K219 Gastro-esophageal reflux disease without esophagitis: Secondary | ICD-10-CM | POA: Diagnosis not present

## 2015-08-05 DIAGNOSIS — E118 Type 2 diabetes mellitus with unspecified complications: Secondary | ICD-10-CM | POA: Diagnosis not present

## 2015-08-05 DIAGNOSIS — E1165 Type 2 diabetes mellitus with hyperglycemia: Secondary | ICD-10-CM | POA: Diagnosis not present

## 2015-08-05 LAB — COMPREHENSIVE METABOLIC PANEL
ALBUMIN: 3.2 g/dL — AB (ref 3.5–5.0)
ALT: 23 U/L (ref 14–54)
ANION GAP: 7 (ref 5–15)
AST: 26 U/L (ref 15–41)
Alkaline Phosphatase: 61 U/L (ref 38–126)
BUN: 12 mg/dL (ref 6–20)
CALCIUM: 8.5 mg/dL — AB (ref 8.9–10.3)
CHLORIDE: 103 mmol/L (ref 101–111)
CO2: 26 mmol/L (ref 22–32)
Creatinine, Ser: 0.64 mg/dL (ref 0.44–1.00)
GFR calc Af Amer: >60 mL/min (ref >60)
GFR calc non Af Amer: >60 mL/min (ref >60)
GLUCOSE: 205 mg/dL — AB (ref 65–99)
POTASSIUM: 4.2 mmol/L (ref 3.5–5.1)
SODIUM: 136 mmol/L (ref 135–145)
TOTAL PROTEIN: 6.4 g/dL — AB (ref 6.5–8.1)
Total Bilirubin: 0.6 mg/dL (ref 0.3–1.2)

## 2015-08-05 LAB — MRSA PCR SCREENING: MRSA BY PCR: NEGATIVE

## 2015-08-05 LAB — GLUCOSE, CAPILLARY
GLUCOSE-CAPILLARY: 188 mg/dL — AB (ref 65–99)
GLUCOSE-CAPILLARY: 141 mg/dL — AB (ref 65–99)
Glucose-Capillary: 191 mg/dL — ABNORMAL HIGH (ref 65–99)

## 2015-08-05 LAB — CBC
HCT: 36.1 % (ref 36.0–46.0)
Hemoglobin: 12.4 g/dL (ref 12.0–15.0)
MCH: 32 pg (ref 26.0–34.0)
MCHC: 34.3 g/dL (ref 30.0–36.0)
MCV: 93.3 fL (ref 78.0–100.0)
PLATELETS: 131 10*3/uL — AB (ref 150–400)
RBC: 3.87 MIL/uL (ref 3.87–5.11)
RDW: 12.3 % (ref 11.5–15.5)
WBC: 9.9 10*3/uL (ref 4.0–10.5)

## 2015-08-05 MED ORDER — RIVAROXABAN 20 MG PO TABS: 20.0000 mg | ORAL_TABLET | Freq: Every day | ORAL | Status: DC

## 2015-08-05 MED ORDER — METOPROLOL SUCCINATE ER 100 MG PO TB24
100.0000 mg | ORAL_TABLET | Freq: Every day | ORAL | Status: DC
Start: 2015-08-05 — End: 2015-08-29

## 2015-08-05 MED ORDER — INFLUENZA VAC SPLIT QUAD 0.5 ML IM SUSY
0.5000 mL | PREFILLED_SYRINGE | INTRAMUSCULAR | Status: DC
  Filled 2015-08-05: qty 0.5

## 2015-08-05 MED ORDER — PNEUMOCOCCAL VAC POLYVALENT 25 MCG/0.5ML IJ INJ
0.5000 mL | INJECTION | INTRAMUSCULAR | Status: DC
  Filled 2015-08-05: qty 0.5

## 2015-08-05 MED ORDER — AMIODARONE HCL 200 MG PO TABS: ORAL_TABLET | ORAL | Status: DC

## 2015-08-05 NOTE — Progress Notes (Signed)
Discharge orders given to patient. IV access removed without incident and no bleeding noted. Patient dressed and will be taken down stairs so husband can transport her home by private vehicle.

## 2015-08-05 NOTE — Discharge Summary (Signed)
Physician Discharge Summary  [name] GYF:[id number] DOB: 10-Nov-1945 DOA: 08/04/2015  PCP: [name], MD  Admit date: 08/04/2015 Discharge date: 08/05/2015  Time spent: 35 minutes  Recommendations for Outpatient Follow-up:  1. Follow up with PCP in 1-2 weeks 2. Follow up with GI as scheduled 3. Follow up with Cardiology in 2 weeks   Discharge Diagnoses:  Principal Problem:   Atrial fibrillation with RVR ([place]) Active Problems:   DM (diabetes mellitus), type 2, uncontrolled ([place])   Essential hypertension   GERD   Ulcerative colitis ([place])   Dyslipidemia   Morbidly obese ([facility])   Dysphagia   Gastroesophageal reflux disease without esophagitis   Discharge Condition: Improved  Diet recommendation: Heart Healthy, low carb  Filed Weights   08/04/15 1200 08/05/15 0500  Weight: 95.9 kg (211 lb 6.7 oz) 96 kg (211 lb 10.3 oz)    History of present illness:  is a 69 y.o. female with a hx of DM type 2, HTN, and GERD that presented with new onset atrial fibrillation with RVR. Patient was in the recovery room of [facility] s/p an EGD due to dysphagia when she began to have an elevated HR of 150bpm. She was given IV metoprolol 4 mg with no improvement in HR. EKG performed at that time revealed atrial fibrillation with RVR rate of 135 bpm. She was also noted to be hypotensive. Cardiology consulted on her prior to admission and began Amiodarone gtt, Heparin and IVF. She will be admitted for further management.   Hospital Course:  Patient was found to have Atrial fibrillation with RVR. She was admitted to the step down unit and started on an amiodarone infusion. She is now in NSR. She was continued on amiodarone infusion for loading dose and transitioned to oral regimen upon discharge. Beta blockers were restarted after her blood pressure remained stable. She's been anticoagulated on Eliquis. Echocardiogram is below and was unremarkable. Cardiology was consulted  1. Essential  Hypertension. Initially hypotensive on admission however BP were stable following IVF. Beta blockers were held. Restarted today after BP remained stable. 2. DM type 2, stable. Home medications and regimen were continued.  3. Possible OSA, will need outpatient follow up and outpatient sleep study 4. GERD. Given PPI. 5. Ulcerative colitis. Appeared to be in remission at this time. 6. Dysphagia. Status post EGD with dilatation. Follow up with GI for further recommendations   Procedures: ECHO Left ventricle: The cavity size was normal. Wall thickness was increased in a pattern of moderate LVH. Systolic function was normal. The estimated ejection fraction was in the range of 60% to 65%. Wall motion was normal; there were no regional wall motion abnormalities. Doppler parameters are consistent with abnormal left ventricular relaxation (grade 1 diastolic dysfunction). - Aortic valve: Moderately calcified annulus. Trileaflet; mildly thickened leaflets. Valve area (VTI): 2.21 cm^2. Valve area (Vmax): 2.1 cm^2. - Mitral valve: Mildly calcified annulus. Mildly thickened leaflets . - Left atrium: The atrium was mildly dilated. - Technically adequate study.  Consultations: 1. Cardiology  Discharge Exam: Filed Vitals:   08/05/15 0800  BP: 127/64  Pulse: 63  Temp:   Resp: 22    General: NAD, appears calm and comfortable, lying in bed Cardiovascular: RRR, no m/r/g. No LE edema. Respiratory: CTA bilaterally, no w/r/r. Normal respiratory effort  Discharge Instructions    Current Discharge Medication List    CONTINUE these medications which have NOT CHANGED   Details  azelastine (ASTELIN) 0.1 % nasal spray Place 2 sprays into both  nostrils 2 (two) times daily. Qty: 30 mL, Refills: 1    cromolyn (OPTICROM) 4 % ophthalmic solution Place 1 drop into both eyes as needed.     DULoxetine (CYMBALTA) 60 MG capsule Take 60 mg by mouth daily.     furosemide (LASIX) 40 MG  tablet Take 1 tablet (40 mg total) by mouth daily. Qty: 90 tablet, Refills: 1    insulin aspart (NOVOLOG) 100 UNIT/ML FlexPen Inject 130 Units into the skin 3 (three) times daily with meals. Qty: 40 pen, Refills: 2    Insulin Detemir (LEVEMIR) 100 UNIT/ML Pen Inject 70 Units into the skin at bedtime. Qty: 130 mL, Refills: 3    lactase (LACTAID) 3000 UNITS tablet Take 3,000 Units by mouth as needed.    Associated Diagnoses: Lymphocytosis    loperamide (IMODIUM) 2 MG capsule Take 2 mg by mouth 3 (three) times daily as needed for diarrhea or loose stools.     meclizine (ANTIVERT) 25 MG tablet Take 1 tablet (25 mg total) by mouth 3 (three) times daily as needed for dizziness or nausea. Qty: 90 tablet, Refills: 0    metoprolol succinate (TOPROL-XL) 100 MG 24 hr tablet Take 1 tablet (100 mg total) by mouth 2 (two) times daily. Qty: 180 tablet, Refills: 3    NEXIUM 40 MG capsule Take 40 mg by mouth daily.    Associated Diagnoses: Lymphocytosis    potassium chloride (KLOR-CON M10) 10 MEQ tablet Take 2 tablets (20 mEq total) by mouth daily. Qty: 180 tablet, Refills: 3    Saccharomyces boulardii (FLORASTOR PO) Take 1 capsule by mouth daily.     inFLIXimab (REMICADE) 100 MG injection Inject into the vein every 8 (eight) weeks.        Allergies  Allergen Reactions  . Actos [Pioglitazone] Other (See Comments)    EDEMA   . Benzocaine-Menthol Swelling    SWELLING OF MOUTH  . Colesevelam Other (See Comments)    GI UPSET  . Flagyl [Metronidazole Hcl] Other (See Comments)    DIAPHORESIS  . Metformin And Related Diarrhea  . Omeprazole Swelling    SWELLING OF TONGUE AND THROAT  . Shrimp [Shellfish Allergy] Itching    OF THROAT AND EARS  . Statins Other (See Comments)    HEART RACING  . Desipramine Hcl Itching, Nausea Only and Other (See Comments)    "swimmy" headed, ears itched   . Hydromorphone Itching  . Adhesive [Tape] Other (See Comments)    SKIN IRRITATION AND BRUISING  .  Nisoldipine Itching  . Percocet [Oxycodone-Acetaminophen] Itching      The results of significant diagnostics from this hospitalization (including imaging, microbiology, ancillary and laboratory) are listed below for reference.    Significant Diagnostic Studies: No results found.  Microbiology: Recent Results (from the past 240 hour(s))  MRSA PCR Screening     Status: None   Collection Time: 08/04/15 12:00 PM  Result Value Ref Range Status   MRSA by PCR NEGATIVE NEGATIVE Final    Comment:        The GeneXpert MRSA Assay (FDA approved for NASAL specimens only), is one component of a comprehensive MRSA colonization surveillance program. It is not intended to diagnose MRSA infection nor to guide or monitor treatment for MRSA infections.      Labs: Basic Metabolic Panel:  Recent Labs Lab 08/04/15 1007 08/04/15 1152 08/05/15 0433  NA 138  --  136  K 4.3  --  4.2  CL 104  --  103  CO2  25  --  26  GLUCOSE 143*  --  205*  BUN 13  --  12  CREATININE 0.60  --  0.64  CALCIUM 8.6*  --  8.5*  MG  --  1.9  --    Liver Function Tests:  Recent Labs Lab 08/04/15 1007 08/05/15 0433  AST 45* 26  ALT 28 23  ALKPHOS 69 61  BILITOT 1.0 0.6  PROT 6.7 6.4*  ALBUMIN 3.3* 3.2*   CBC:  Recent Labs Lab 08/04/15 1205 08/05/15 0433  WBC 5.7 9.9  NEUTROABS 3.4  --   HGB 14.1 12.4  HCT 40.8 36.1  MCV 92.9 93.3  PLT 150 131*   Cardiac Enzymes:  Recent Labs Lab 08/04/15 1007 08/04/15 1537 08/04/15 2207  TROPONINI <0.03 <0.03 <0.03   CBG:  Recent Labs Lab 08/04/15 0856 08/04/15 1740 08/04/15 2029 08/05/15 0610 08/05/15 0726  GLUCAP 101* 292* 289* 188* 191*       Signed: Kathie Dike, MD  [facility] 08/05/2015, 9:15 AM  By signing my name below, I, Rosalie Doctor, attest that this documentation has been prepared under the direction and in the presence of [facility]. MD Electronically Signed: Rosalie Doctor, Scribe. 08/02/2015 9:18  AM    I, Dr. Kathie Dike, personally performed the services described in this documentaiton. All medical record entries made by the scribe were at my direction and in my presence. I have reviewed the chart and agree that the record reflects my personal performance and is accurate and complete  Kathie Dike, MD, 08/05/2015 10:36 AM

## 2015-08-05 NOTE — Progress Notes (Signed)
Inpatient Diabetes Program Recommendations  AACE/ADA: New Consensus Statement on Inpatient Glycemic Control (2015)  Target Ranges:  Prepandial:   less than 140 mg/dL      Peak postprandial:   less than 180 mg/dL (1-2 hours)      Critically ill patients:  140 - 180 mg/dL   Review of Glycemic Control  Diabetes history: Type 2- last A1C was 6.5% on 02/21/15 Outpatient Diabetes medications: Levemir 70 units qhs, Novolog 130 units tid with meals Current orders for Inpatient glycemic control: Levemir 70 units qhs, Novolog 100 units tid with meals, Novolog 0-9 units tid with meals  Inpatient Diabetes Program Recommendations:  Spoke with RN Purcell Nails this am and confirmed that patient received 102 units of Novollog with her breakfast, not 202units as indicated on the glucose summary and MAR. Patient does take her outpatient meds as reported above.  Patient is being discharged home- she refused her lunch time Novolog because of the discharge- she will recheck at home and resume Novolog at supper. I have asked Lawrence to check her blood sugar before being discharged.    Gentry Fitz, RN, BA, MHA, CDE Diabetes Coordinator Inpatient Diabetes Program  [phone] (Team Pager) [phone] ([place]) 08/05/2015 12:26 PM

## 2015-08-05 NOTE — Progress Notes (Signed)
  Subjective:  Patient reports scratchy feeling in her throat which she had no difficulty eating her breakfast. She denies abdominal pain. She denies shortness of breath or chest pain. She tells me she will be going home later today.   Objective: Blood pressure 127/64, pulse 63, temperature 97.3 F (36.3 C), temperature source Oral, resp. rate 22, height 5' 2"  (1.575 m), weight 211 lb 10.3 oz (96 kg), SpO2 100 %. Patient is alert and in no acute distress.  Labs/studies Results:   Recent Labs  08/04/15 1205 08/05/15 0433  WBC 5.7 9.9  HGB 14.1 12.4  HCT 40.8 36.1  PLT 150 131*    BMET   Recent Labs  08/04/15 1007 08/05/15 0433  NA 138 136  K 4.3 4.2  CL 104 103  CO2 25 26  GLUCOSE 143* 205*  BUN 13 12  CREATININE 0.60 0.64  CALCIUM 8.6* 8.5*    LFT   Recent Labs  08/04/15 1007 08/05/15 0433  PROT 6.7 6.4*  ALBUMIN 3.3* 3.2*  AST 45* 26  ALT 28 23  ALKPHOS 69 61  BILITOT 1.0 0.6    PT/INR   Recent Labs  08/04/15 1007  LABPROT 14.9  INR 1.15     Assessment:  #1. Dysphagia. Endoscopic findings reviewed with patient. If dysphagia progresses will consider esophageal manometry and impedance study. #2. Ulcerative colitis. She is in remission as evidenced by essentially normal colonoscopy yesterday. Therefore no contraindication to use of anticoagulant as discussed with Ms. Bunnie Domino NP of cardiology service #3. Atrial fibrillation noted while patient was in recovery following EGD and colonoscopy yesterday. She converted with therapy and remains in normal sinus rhythm.   Recommendations: Patient will call if dysphagia progresses or if she has rectal bleeding. Office visit in 6 months.

## 2015-08-07 ENCOUNTER — Telehealth: Payer: Self-pay | Admitting: Internal Medicine

## 2015-08-07 ENCOUNTER — Encounter (HOSPITAL_COMMUNITY): Payer: Self-pay | Admitting: Internal Medicine

## 2015-08-07 DIAGNOSIS — E669 Obesity, unspecified: Secondary | ICD-10-CM

## 2015-08-07 DIAGNOSIS — I10 Essential (primary) hypertension: Secondary | ICD-10-CM

## 2015-08-07 DIAGNOSIS — I4891 Unspecified atrial fibrillation: Secondary | ICD-10-CM

## 2015-08-07 NOTE — Telephone Encounter (Signed)
Pt was in hospital over the weekend for a procedure and her heart did something that seemed odd and advised her to contact you to get a sleep apnea test done.  Also, she needs a prescription for [facility] LANCETS 43X MISC [[id number]. I found this on her old med list. She states she is testing 6-7 times a day until it gets adjusted Pharmacy is [facility].  Please give her a call if you have questions

## 2015-08-08 MED ORDER — ONETOUCH DELICA LANCETS 33G MISC: Status: DC

## 2015-08-08 NOTE — Telephone Encounter (Signed)
erx for lancets done.

## 2015-08-09 NOTE — Telephone Encounter (Signed)
Order pended

## 2015-08-10 NOTE — Telephone Encounter (Signed)
AMB order for sleep study signed thanks

## 2015-08-15 ENCOUNTER — Encounter (HOSPITAL_COMMUNITY): Payer: Medicare Other

## 2015-08-15 ENCOUNTER — Encounter (HOSPITAL_COMMUNITY)
Admission: RE | Admit: 2015-08-15 | Discharge: 2015-08-15 | Disposition: A | Discharge status: Home / Self Care | Payer: Medicare Other | Source: Ambulatory Visit | Attending: Internal Medicine | Admitting: Internal Medicine

## 2015-08-16 ENCOUNTER — Other Ambulatory Visit (INDEPENDENT_AMBULATORY_CARE_PROVIDER_SITE_OTHER): Payer: Self-pay | Admitting: *Deleted

## 2015-08-16 ENCOUNTER — Telehealth (INDEPENDENT_AMBULATORY_CARE_PROVIDER_SITE_OTHER): Payer: Self-pay | Admitting: *Deleted

## 2015-08-16 MED ORDER — CHOLESTYRAMINE 4 G PO PACK: 4.0000 g | PACK | Freq: Two times a day (BID) | ORAL | Status: DC

## 2015-08-16 NOTE — Telephone Encounter (Signed)
Per Dr.Rehman - the patient may try Questran 4 grams BID. She will need to take it 2 hours before or after meals. She may stop the Lomotil and take the Imodium. Patient was called and made aware and a Rx was sent into her Pharmacy.

## 2015-08-16 NOTE — Telephone Encounter (Signed)
Per Dr.Rehman may send in prescription for this medication.

## 2015-08-16 NOTE — Telephone Encounter (Signed)
[name] would like to see if Dr. Laural [name] would give her a return call at [phone]. The OTC medicines for diarrhea are not working for her and feels she is needing help with this.

## 2015-08-17 ENCOUNTER — Ambulatory Visit (INDEPENDENT_AMBULATORY_CARE_PROVIDER_SITE_OTHER): Payer: Medicare Other | Admitting: Neurology

## 2015-08-17 ENCOUNTER — Encounter (INDEPENDENT_AMBULATORY_CARE_PROVIDER_SITE_OTHER): Payer: Self-pay

## 2015-08-17 ENCOUNTER — Encounter: Payer: Self-pay | Admitting: Neurology

## 2015-08-17 VITALS — BP 130/76 | HR 58 | Ht 62.0 in | Wt 206.2 lb

## 2015-08-17 DIAGNOSIS — E531 Pyridoxine deficiency: Secondary | ICD-10-CM | POA: Diagnosis not present

## 2015-08-17 DIAGNOSIS — G6289 Other specified polyneuropathies: Secondary | ICD-10-CM

## 2015-08-17 DIAGNOSIS — Z7901 Long term (current) use of anticoagulants: Secondary | ICD-10-CM | POA: Diagnosis not present

## 2015-08-17 DIAGNOSIS — I480 Paroxysmal atrial fibrillation: Secondary | ICD-10-CM | POA: Diagnosis not present

## 2015-08-17 DIAGNOSIS — E538 Deficiency of other specified B group vitamins: Secondary | ICD-10-CM | POA: Diagnosis not present

## 2015-08-17 DIAGNOSIS — G629 Polyneuropathy, unspecified: Secondary | ICD-10-CM

## 2015-08-17 NOTE — Progress Notes (Signed)
NEUROLOGY CLINIC NEW PATIENT NOTE  NAME: [name] DOB: 10-04-1945 REFERRING PHYSICIAN: Megan Salon, MD  I saw [name] as a new consult in the neurovascular clinic today regarding  Chief Complaint  Patient presents with  . Referral    from [facility], saw [facility] in March 2016  .  HPI: [name] is a 69 y.o. 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 Posey Pronto in [facility] who presents as a new patient for second opinion about neuropathy.   She stated that about 5 years ago, she had CTS symptoms at both hands with hands went to sleep at night, painful hands including palms and fingers, she had to wear braces at night, but eventually she had b/l CTS surgery and symptoms resolved. About 2 years ago, she started to have burning sensation of both hands but only distal to proximal interphalangeal joints. Not the same feeling as her CTS in the past, but she was evaluated by orthopedics and EMG done at that time showed right mild to moderate CTS and left mild CTS, with no polyneuropathy. She then had second bilateral CTS surgery but this time did not work that much. She continued to have bilateral finger burning distal to PI joints. She also complains tingling sensation of her toes bilaterally for the last 5 years. She was referred to Dr. Posey Pronto at [facility] and had neuropathy work up with negative heavy metal, autoimmune tests, and diagnosed with small fiber neuropathy. She has since tried multiple medications including gabapentin which does not work at all, Consulting civil engineer which is expensive and not tolerating, amitriptyline and nortriptyline which made pt not able to function, lidocaine cream which is not working, and cymbalta which pt found is the only one that can help some. Currently, she is on cymbalta 110m daily, but stated that she is very sleepy  with this medication, and she can not drive or doing something needs constant attention.   She has hx of DM, on levemir, last A1C in august was 6.5. Currently glucose ranging from 75-160s. She was just found to have afib one week ago, she was put on amiodarone and Xarelto. She has hx of ulcerative colitis, on ramicade injection every 2 months, UC is in remission. She does not feel ramicade making her neuropathy worse. Her lymphocytes does increased after ramicade but she is following with oncology for that and so far just observation.   Past Medical History  Diagnosis Date  . GERD   . Osteoarthritis   . HYPERTENSION   . ID[place]   . Ulcerative colitis (H[facility])     Remicade infusion Q 8 weeks  . History of thrombocytopenia 12/2011  . 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 Dental bridge present     lower  . Anxiety   . Complication of anesthesia    Past Surgical History  Procedure Laterality Date  . Abdominal hysterectomy  1980    partial  . Breast reduction surgery  1994  . Rectocele repair  1990; 09/12/2006  . Carpal tunnel release Right 1996  . Knee arthroscopy Right 01/1999; 10/2000  . Hemilaminotomy lumbar spine Bilateral 09/07/1999    L4-5  . Cholecystectomy    . Flexible sigmoidoscopy  01/17/2012    Procedure: FLEXIBLE SIGMOIDOSCOPY;  Surgeon: NRogene Houston MD;  [facility];  Service: Endoscopy;  Laterality: N/A;  . Bilateral salpingoophorectomy  02/10/2001  . Tarsal tunnel release  2002  . Ureterolysis Right 02/10/2001  . Lysis of adhesion  02/10/2001  . Carpal tunnel release Left 03/21/2003  . 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 Esophagogastroduodenoscopy (egd) with esophageal dilation  12/02/2005  . Carpal tunnel release Right 05/04/2013    Procedure: CARPAL TUNNEL RELEASE;  Surgeon: Cammie Sickle., MD;  Location: [facility];  Service: Orthopedics;   Laterality: Right;  . Carpal tunnel release Left 09/21/2013    Procedure: LEFT CARPAL TUNNEL RELEASE;  Surgeon: Cammie Sickle., MD;  Location: [facility];  Service: Orthopedics;  Laterality: Left;  . Bacterial overgrowth test N/A 07/13/2015    Procedure: BACTERIAL OVERGROWTH TEST;  Surgeon: Rogene Houston, MD;  [facility];  Service: Endoscopy;  Laterality: N/A;  730    . Esophagogastroduodenoscopy (egd) with propofol N/A 08/04/2015    Procedure: ESOPHAGOGASTRODUODENOSCOPY (EGD) WITH PROPOFOL;  Surgeon: Rogene Houston, MD;  Location: [facility];  Service: Endoscopy;  Laterality: N/A;  procedure 1  . Esophageal dilation N/A 08/04/2015    Procedure: ESOPHAGEAL DILATION;  Surgeon: Rogene Houston, MD;  Location: [facility];  Service: Endoscopy;  Laterality: N/A;  Mal[place] 57, no mucousal disruption  . Colonoscopy with propofol N/A 08/04/2015    Procedure: COLONOSCOPY WITH PROPOFOL;  Surgeon: Rogene Houston, MD;  Location: [facility];  Service: Endoscopy;  Laterality: N/A;  cecum time in  0820   time out  0827    total time 7 minutes   Family History  Problem Relation Age of Onset  . Diabetes Mother   . Hypertension Mother   . Heart attack Father     Mid 40's  . Heart disease Father   . Lung disease Father     spot on lung; had lung surgery  . Alcohol abuse Other   . Hypertension Son   . Diabetes Son    Current Outpatient Prescriptions  Medication Sig Dispense Refill  . amiodarone (PACERONE) 200 MG tablet Take 412m bid for 7 days then 2083mbid for 14 days 56 tablet 0  . azelastine (ASTELIN) 0.1 % nasal spray Place 2 sprays into both nostrils 2 (two) times daily. 30 mL 1  . cholestyramine (QUESTRAN) 4 G packet Take 1 packet (4 g total) by mouth 2 (two) times daily. 60 each 12  . cromolyn (OPTICROM) 4 % ophthalmic solution Place 1 drop into both eyes as needed.     . DULoxetine (CYMBALTA) 60 MG capsule Take 60 mg by mouth daily.     . furosemide (LASIX) 40 MG tablet  Take 1 tablet (40 mg total) by mouth daily. 90 tablet 1  . inFLIXimab (REMICADE) 100 MG injection Inject into the vein every 8 (eight) weeks.     . insulin aspart (NOVOLOG) 100 UNIT/ML FlexPen Inject 130 Units into the skin 3 (three) times daily with meals. 40 pen 2  . Insulin Detemir (LEVEMIR) 100 UNIT/ML Pen Inject 70 Units into the skin at bedtime. 130 mL 3  . lactase (LACTAID) 3000 UNITS tablet Take 3,000 Units by mouth as needed.     . loperamide (IMODIUM) 2 MG capsule Take 2 mg by mouth 3 (three) times daily as needed for diarrhea or loose stools.     . meclizine (ANTIVERT) 25 MG tablet Take 1 tablet (25 mg total) by mouth 3 (  three) times daily as needed for dizziness or nausea. 90 tablet 0  . metoprolol succinate (TOPROL-XL) 100 MG 24 hr tablet Take 1 tablet (100 mg total) by mouth daily. 180 tablet 3  . NEXIUM 40 MG capsule Take 40 mg by mouth daily.     Glory Rosebush DELICA LANCETS 09N MISC PATIENT USES ONE TOUCH DELICAL 23F LANCETS. Use as directed to check blood sugar up to 6 times per day. DX: E11.09 500 each 3  . potassium chloride (KLOR-CON M10) 10 MEQ tablet Take 2 tablets (20 mEq total) by mouth daily. 180 tablet 3  . rivaroxaban (XARELTO) 20 MG TABS tablet Take 1 tablet (20 mg total) by mouth daily with supper. 30 tablet 1  . Saccharomyces boulardii (FLORASTOR PO) Take 1 capsule by mouth daily.     . [DISCONTINUED] amitriptyline (ELAVIL) 25 MG tablet Take 1 tablet (25 mg total) by mouth at bedtime. start with 1/2 tab at night for 1 week then increase to 1 whole tab. 30 tablet 3  . [DISCONTINUED] bromocriptine (PARLODEL) 2.5 MG tablet Take 2.5 mg by mouth 2 (two) times daily.      . [DISCONTINUED] potassium chloride (K-DUR) 10 MEQ tablet Take 2 tablets (20 mEq total) by mouth daily. 90 tablet 1   No current facility-administered medications for this visit.   Allergies  Allergen Reactions  . Actos [Pioglitazone] Other (See Comments)    EDEMA   . Benzocaine-Menthol Swelling     SWELLING OF MOUTH  . Colesevelam Other (See Comments)    GI UPSET  . Flagyl [Metronidazole Hcl] Other (See Comments)    DIAPHORESIS  . Metformin And Related Diarrhea  . Omeprazole Swelling    SWELLING OF TONGUE AND THROAT  . Shrimp [Shellfish Allergy] Itching    OF THROAT AND EARS  . Statins Other (See Comments)    HEART RACING  . Desipramine Hcl Itching, Nausea Only and Other (See Comments)    "swimmy" headed, ears itched   . Hydromorphone Itching  . Adhesive [Tape] Other (See Comments)    SKIN IRRITATION AND BRUISING  . Nisoldipine Itching  . Percocet [Oxycodone-Acetaminophen] Itching   Social History   Social History  . Marital Status: Married    Spouse Name: N/A  . Number of Children: N/A  . Years of Education: N/A   Occupational History  . Not on file.   Social History Main Topics  . Smoking status: Never Smoker   . Smokeless tobacco: Never Used  . Alcohol Use: 0.0 oz/week    0 Standard drinks or equivalent per week     Comment: very rare  . Drug Use: No  . Sexual Activity:    Partners: Male    Birth Control/ Protection: Surgical     Comment: hysterectomy   Other Topics Concern  . Not on file   Social History Narrative   She lives with husband in two-story home.  They have one grown son and 2 grandchildren.   She is retired [employer].   Highest of level education:  Some college    Review of Systems Full 14 system review of systems performed and notable only for those listed, all others are neg:  Constitutional:   Cardiovascular:  Ear/Nose/Throat:  Trouble swallowing Skin:  Eyes:   Respiratory:  Cough, snoring Gastroitestinal:  diarrhea Genitourinary:  Hematology/Lymphatic:   Endocrine: feeling hot/cold Musculoskeletal:  Joint pain, joint swelling Allergy/Immunology:   Neurological:  Numbness, weakness Psychiatric: decreased energy Sleep: insomnia, restless leg  Physical Exam  Filed Vitals:   08/17/15 1127  BP: 130/76  Pulse:  58    General - obese, well developed, in no apparent distress.  Ophthalmologic - Sharp disc margins OU.  Cardiovascular - irregularly irregular heart rate and rhythm.   Neck - supple, no nuchal rigidity.  Mental Status -  Level of arousal and orientation to time, place, and person were intact. Language including expression, naming, repetition, comprehension, reading, and writing was assessed and found intact. Fund of Knowledge was assessed and was intact.  Cranial Nerves II - XII - II - Visual field intact OU. III, IV, VI - Extraocular movements intact. V - Facial sensation intact bilaterally. VII - Facial movement intact bilaterally. VIII - Hearing & vestibular intact bilaterally. X - Palate elevates symmetrically. XI - Chin turning & shoulder shrug intact bilaterally. XII - Tongue protrusion intact.  Motor Strength - The patient's strength was normal in all extremities and pronator drift was absent.  Bulk was normal and fasciculations were absent.   Motor Tone - Muscle tone was assessed at the neck and appendages and was normal.  Reflexes - The patient's reflexes were normal in all extremities and she had no pathological reflexes.  Sensory - Light touch, temperature/pinprick and Romberg testing were assessed and were normal.    Coordination - The patient had normal movements in the hands and feet with no ataxia or dysmetria.  Tremor was absent.  Gait and Station - The patient's transfers, posture, gait, station, and turns were observed as normal but not able to do tandem gait due to habitus.   Imaging  I have personally reviewed the radiological images below and agree with the radiology interpretations.  EMG 03/17/2013 at [facility]: Shows bilateral carpal tunnel syndrome, mild to moderate in degree, and worse on the right.   MRI cervical spine wo contrast 12/05/2011:  1. Spondylosis and broad-based central disc protrusion at C5-C6 contribute to moderate spinal  stenosis with probable mild left greater than right foraminal narrowing. Foraminal assessment is mildly motion limited; consider oblique plain film correlation. No abnormal cord signal is seen.  2. Asymmetric facet hypertrophy on the left at C6-C7 contributes to mild to moderate left foraminal stenosis.  3. No other significant spinal stenosis or nerve root encroachment identified.   MRI cervical spine wo contrast 05/12/2012:  1. New focal soft disc protrusion at C5-6 into the right lateral recess compressing the right C6 nerve and slightly compressing the  adjacent spinal cord.  2. No other significant change since the prior study.  Lab Review Component     Latest Ref Rng 01/31/2014 03/04/2014 05/18/2014  ds DNA Ab       <1   Ribosomal P Protein Ab     <1.0 NEG AI  <1.0 NEG   SSA (Ro) (ENA) Antibody, IgG     <1.0 NEG AI  <1.0 NEG   SSB (La) (ENA) Antibody, IgG     <1.0 NEG AI  <1.0 NEG   CENTROMERE AB SCREEN     <1.0 NEG AI  <1.0 NEG   ENA SM Ab Ser-aCnc     <1.0 NEG AI  <1.0 NEG   SM/RNP     <1.0 NEG AI  <1.0 NEG   Scleroderma (Scl-70) (ENA) Antibody, IgG     <1.0 NEG AI  <1.0 NEG   Jo-1 Antibody, IgG     <1.0 NEG AI  <1.0 NEG   Cholesterol     0 - 200 mg/dL  235 (H)  Triglycerides     0.0 - 149.0 mg/dL   144.0  HDL Cholesterol     >39.00 mg/dL   48.80  VLDL     0.0 - 40.0 mg/dL   28.8  LDL (calc)     0 - 99 mg/dL   157 (H)  Total CHOL/HDL Ratio        5  NonHDL        186.20  Vitamin B6     2.1 - 21.7 ng/mL 12.9    Hemoglobin A1C     4.6 - 6.5 % 8.3 (H)  7.2 (H)  Sed Rate     0 - 22 mm/hr  4   CRP     <0.60 mg/dL  <0.5   Copper     70 - 175 mcg/dL  79   Vitamin B-12     211 - 911 pg/mL  408   Ceruloplasmin     18 - 53 mg/dL  23   TSH     0.350 - 4.500 uIU/mL  1.400    Component     Latest Ref Rng 08/18/2014 02/21/2015 08/04/2015  ds DNA Ab          Ribosomal P Protein Ab     <1.0 NEG AI     SSA (Ro) (ENA) Antibody, IgG     <1.0 NEG AI     SSB (La)  (ENA) Antibody, IgG     <1.0 NEG AI     CENTROMERE AB SCREEN     <1.0 NEG AI     ENA SM Ab Ser-aCnc     <1.0 NEG AI     SM/RNP     <1.0 NEG AI     Scleroderma (Scl-70) (ENA) Antibody, IgG     <1.0 NEG AI     Jo-1 Antibody, IgG     <1.0 NEG AI     Cholesterol     0 - 200 mg/dL     Triglycerides     0.0 - 149.0 mg/dL     HDL Cholesterol     >39.00 mg/dL     VLDL     0.0 - 40.0 mg/dL     LDL (calc)     0 - 99 mg/dL     Total CHOL/HDL Ratio          NonHDL          Vitamin B6     2.1 - 21.7 ng/mL     Hemoglobin A1C     4.6 - 6.5 % 6.8 (H) 6.5   Sed Rate     0 - 22 mm/hr     CRP     <0.60 mg/dL     Copper     70 - 175 mcg/dL     Vitamin B-12     211 - 911 pg/mL     Ceruloplasmin     18 - 53 mg/dL     TSH     0.350 - 4.500 uIU/mL   2.031     Assessment and Plan:   In summary, [name] is a 69 y.o. 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 Posey Pronto in [facility] who presents as a new patient for second opinion about neuropathy. Her symptoms 5 years ago more consistent with CTS and surgery helped her. But the recent symptoms did not consistent with  CTS and 2nd surgery did not help. Her burning sensation distal to PI joint bilaterally and toe tingling bilaterally more consistent with polyneuropathy although EMG 2014 did not show that. However, she does have DM and ulcerative colitis so potentially she can have secondary neuropathy due to those. Small fiber neuropathy is of course in the DDx, previous work up did not show underlying disease. But will check SPEP and repeat EMG. Will refer to my colleague also neuromuscular specialist Dr. Jaynee Eagles for follow up.   - continue cymbalta for neuropathy for now - will repeat EMG/NCS - will check SPEP, B1, ANCA which have not been checked before - continue Xarelto for stroke prevention given afib - follow up in 2  months with our neuromuscular specialist Dr. Jaynee Eagles  Thank you very much for the opportunity to participate in the care of this patient.  Please do not hesitate to call if any questions or concerns arise.  A total of 60 minutes was spent face-to-face with this patient. Over half this time was spent on counseling patient on the neuropathy diagnosis and different diagnostic and therapeutic options available.    Orders Placed This Encounter  Procedures  . Protein electrophoresis, serum  . Vitamin B1  . Pan-ANCA  . NCV with EMG(electromyography)    Standing Status: Future     Number of Occurrences:      Standing Expiration Date: 08/16/2016    Order Specific Question:  Where should this test be performed?    Answer:  [facility]    No orders of the defined types were placed in this encounter.    Patient Instructions  - continue cymbalta for neuropathy for now - will repeat nerve conduction study - will do some blood draw today for neuropathy work up - follow up in 2 months with our neuromuscular specialist Dr. Hinda Glatter, MD PhD [facility] [address], [place], [place] [address] [phone]

## 2015-08-17 NOTE — Patient Instructions (Signed)
-   continue cymbalta for neuropathy for now - will repeat nerve conduction study - will do some blood draw today for neuropathy work up - follow up in 2 months with our neuromuscular specialist Dr. Jaynee Eagles

## 2015-08-21 LAB — PAN-ANCA

## 2015-08-21 LAB — PROTEIN ELECTROPHORESIS, SERUM
A/G Ratio: 1 (ref 0.7–1.7)
ALBUMIN ELP: 3.4 g/dL (ref 2.9–4.4)
ALPHA 1: 0.3 g/dL (ref 0.0–0.4)
ALPHA 2: 0.6 g/dL (ref 0.4–1.0)
BETA: 1.2 g/dL (ref 0.7–1.3)
GLOBULIN, TOTAL: 3.4 g/dL (ref 2.2–3.9)
Gamma Globulin: 1.3 g/dL (ref 0.4–1.8)
Total Protein: 6.8 g/dL (ref 6.0–8.5)

## 2015-08-21 LAB — VITAMIN B1: Thiamine: 152.6 nmol/L (ref 66.5–200.0)

## 2015-08-22 ENCOUNTER — Telehealth: Payer: Self-pay

## 2015-08-22 NOTE — Telephone Encounter (Signed)
-----   Message from Rosalin Hawking, MD sent at 08/21/2015  5:02 PM EST ----- Hi, Katrina:  Could you please let the pt know that her blood tests last week in office were all normal. No abnormal results. She has appointment with Dr. Jaynee Eagles on 09/13/15. Thanks,  Rosalin Hawking, MD PhD St[facility] Neurology 08/21/2015 5:01 PM

## 2015-08-22 NOTE — Telephone Encounter (Signed)
Rn call patient to notify her that all lab work was normal and to continue treatment plan. Pt verbalized understanding. Pt stated she does hurt from the neuropathy. Rn stated to patient to continue taking her meds as prescribed. PT has a nerve conduction, and EMG on 09-13-15. Pt was told to arrive at 0245pm. Pt stated she will be at the visit.

## 2015-08-28 ENCOUNTER — Telehealth: Payer: Self-pay | Admitting: Cardiology

## 2015-08-28 MED ORDER — AMIODARONE HCL 200 MG PO TABS: 200.0000 mg | ORAL_TABLET | Freq: Every day | ORAL | Status: DC

## 2015-08-28 NOTE — Telephone Encounter (Signed)
[name] is wanting to know if she should still be taking her Xarelto 84m and Pacerone 2069m . Please call   Thanks

## 2015-08-28 NOTE — Telephone Encounter (Signed)
Change amiodarone to 200 mg daily; ok to refill; fu as scheduled. Kirk Ruths

## 2015-08-28 NOTE — Telephone Encounter (Signed)
Returned call to patient.She stated she had a colonoscopy 08/04/15 and her heart went out of rhythm.Stated she spent the night in ICU.She was prescribed Pacerone 200 mg twice a day and Xarelto 20 mg.Stated she needs appointment with Dr.Crenshaw.Stated she is doing good at present.She last saw Dr.Crenshaw 2011.New patient appointment scheduled with Dr.Crenshaw 10/09/15 at 2:30 pm. Stated she will need a refill for Pacerone.Message sent to [facility] for a ok to refill Pacerone until appointment

## 2015-08-28 NOTE — Telephone Encounter (Signed)
Spoke with pt, Aware of dr crenshaw's recommendations. New script sent to the pharmacy  

## 2015-08-29 ENCOUNTER — Ambulatory Visit (INDEPENDENT_AMBULATORY_CARE_PROVIDER_SITE_OTHER): Payer: Medicare Other | Admitting: Internal Medicine

## 2015-08-29 ENCOUNTER — Encounter: Payer: Self-pay | Admitting: Internal Medicine

## 2015-08-29 ENCOUNTER — Telehealth: Payer: Self-pay | Admitting: *Deleted

## 2015-08-29 ENCOUNTER — Other Ambulatory Visit: Payer: Self-pay

## 2015-08-29 ENCOUNTER — Other Ambulatory Visit (INDEPENDENT_AMBULATORY_CARE_PROVIDER_SITE_OTHER): Payer: Medicare Other

## 2015-08-29 VITALS — BP 128/70 | HR 59 | Temp 98.1°F | Ht 62.0 in | Wt 204.5 lb

## 2015-08-29 DIAGNOSIS — IMO0002 Reserved for concepts with insufficient information to code with codable children: Secondary | ICD-10-CM

## 2015-08-29 DIAGNOSIS — E1165 Type 2 diabetes mellitus with hyperglycemia: Secondary | ICD-10-CM

## 2015-08-29 DIAGNOSIS — B370 Candidal stomatitis: Secondary | ICD-10-CM | POA: Diagnosis not present

## 2015-08-29 DIAGNOSIS — Z794 Long term (current) use of insulin: Secondary | ICD-10-CM

## 2015-08-29 DIAGNOSIS — E785 Hyperlipidemia, unspecified: Secondary | ICD-10-CM

## 2015-08-29 DIAGNOSIS — Z23 Encounter for immunization: Secondary | ICD-10-CM | POA: Diagnosis not present

## 2015-08-29 DIAGNOSIS — E118 Type 2 diabetes mellitus with unspecified complications: Secondary | ICD-10-CM | POA: Diagnosis not present

## 2015-08-29 DIAGNOSIS — G629 Polyneuropathy, unspecified: Secondary | ICD-10-CM

## 2015-08-29 LAB — LIPID PANEL
CHOL/HDL RATIO: 5
Cholesterol: 230 mg/dL — ABNORMAL HIGH (ref 0–200)
HDL: 42.9 mg/dL (ref >39.00)
LDL CALC: 153 mg/dL — AB (ref 0–99)
NONHDL: 187.56
TRIGLYCERIDES: 175 mg/dL — AB (ref 0.0–149.0)
VLDL: 35 mg/dL (ref 0.0–40.0)

## 2015-08-29 LAB — HEMOGLOBIN A1C: Hgb A1c MFr Bld: 6.6 % — ABNORMAL HIGH (ref 4.6–6.5)

## 2015-08-29 MED ORDER — CHOLESTYRAMINE 4 G PO PACK: 4.0000 g | PACK | Freq: Two times a day (BID) | ORAL | Status: DC

## 2015-08-29 MED ORDER — FUROSEMIDE 40 MG PO TABS: 40.0000 mg | ORAL_TABLET | Freq: Every day | ORAL | Status: DC

## 2015-08-29 MED ORDER — MECLIZINE HCL 25 MG PO TABS: 25.0000 mg | ORAL_TABLET | Freq: Three times a day (TID) | ORAL | Status: DC | PRN

## 2015-08-29 MED ORDER — NYSTATIN 100000 UNIT/ML MT SUSP: 5.0000 mL | Freq: Three times a day (TID) | OROMUCOSAL | Status: DC | PRN

## 2015-08-29 MED ORDER — INSULIN DETEMIR 100 UNIT/ML FLEXPEN: 70.0000 [IU] | PEN_INJECTOR | Freq: Every day | SUBCUTANEOUS | Status: DC

## 2015-08-29 MED ORDER — GLUCOSE BLOOD VI STRP: ORAL_STRIP | Status: DC

## 2015-08-29 MED ORDER — DULOXETINE HCL 60 MG PO CPEP: 60.0000 mg | ORAL_CAPSULE | Freq: Every day | ORAL | Status: DC

## 2015-08-29 MED ORDER — POTASSIUM CHLORIDE CRYS ER 10 MEQ PO TBCR: 20.0000 meq | EXTENDED_RELEASE_TABLET | Freq: Every day | ORAL | Status: DC

## 2015-08-29 MED ORDER — INSULIN ASPART 100 UNIT/ML FLEXPEN: 130.0000 [IU] | PEN_INJECTOR | Freq: Three times a day (TID) | SUBCUTANEOUS | Status: DC

## 2015-08-29 MED ORDER — DULOXETINE HCL 30 MG PO CPEP: 30.0000 mg | ORAL_CAPSULE | Freq: Every day | ORAL | Status: DC

## 2015-08-29 MED ORDER — "PEN NEEDLES 5/16"" 31G X 8 MM MISC": Status: DC

## 2015-08-29 MED ORDER — RIVAROXABAN 20 MG PO TABS: 20.0000 mg | ORAL_TABLET | Freq: Every day | ORAL | Status: DC

## 2015-08-29 MED ORDER — METOPROLOL SUCCINATE ER 100 MG PO TB24: 100.0000 mg | ORAL_TABLET | Freq: Every day | ORAL | Status: DC

## 2015-08-29 NOTE — Telephone Encounter (Signed)
[facility] left msg on triage stating received script for Novolog 130 units four times a day. Needing to verify dosage seem very high. Called pt to verify how much she is taking. The Novolog 130 units is correct, but she she states md told her to take 60 units at night. [facility] Pt states all of them were suppose to go to mail order. Inform pt will [facility] and [facility] and [facility]. [facility]ified [facility] spoke with pharmacist inform him dosage was correct, but to void [facility]...[name]

## 2015-08-29 NOTE — Assessment & Plan Note (Signed)
Check lipids now -  intol of atorva or crestor low dose or qod dosing Also reviewed poor tol of simva in past

## 2015-08-29 NOTE — Progress Notes (Signed)
Subjective:    Patient ID: [name], female    DOB: 12-11-1945, 69 y.o.   MRN: [id number]  HPI  Patient here for follow up  Past Medical History  Diagnosis Date  . GERD   . Osteoarthritis   . HYPERTENSION   . IDDM (insulin dependent diabetes mellitus) ([place])   . Ulcerative colitis (HCC)     Remicade infusion Q 8 weeks  . History of thrombocytopenia 12/2011  . 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 Dental bridge present     lower  . Anxiety   . Complication of anesthesia     Review of Systems  Constitutional: Negative for fever and appetite change.  HENT: Negative for trouble swallowing (feels dry).        Dry mouth  Respiratory: Positive for cough (since starting higher dose of cymbalta (25m)).   Musculoskeletal: Positive for myalgias and arthralgias.  Neurological: Positive for numbness (improved in feet>hands on 625mcompared to 305mose effect of Cymbalta).  Psychiatric/Behavioral: Positive for sleep disturbance.       Objective:    Physical Exam  Constitutional: She appears well-developed and well-nourished. No distress.  MO  HENT:  Mouth/Throat: Oropharyngeal exudate: candida changes.  Cardiovascular: Normal rate, regular rhythm and normal heart sounds.   No murmur heard. Pulmonary/Chest: Effort normal and breath sounds normal. No respiratory distress.  Musculoskeletal: She exhibits no edema.  Skin: Skin is warm and dry. No rash noted. No erythema.    BP 128/70 mmHg  Pulse 59  Temp(Src) 98.1 F (36.7 C) (Oral)  Ht 5' 2"  (1.575 m)  Wt 204 lb 8 oz (92.761 kg)  BMI 37.39 kg/m2  SpO2 97% Wt Readings from Last 3 Encounters:  08/29/15 204 lb 8 oz (92.761 kg)  08/17/15 206 lb 3.2 oz (93.532 kg)  08/05/15 211 lb 10.3 oz (96 kg)     Lab Results  Component Value Date   WBC 9.9 08/05/2015   HGB 12.4 08/05/2015   HCT 36.1 08/05/2015   PLT 131* 08/05/2015   GLUCOSE 205* 08/05/2015   CHOL 235* 05/18/2014   TRIG 144.0  05/18/2014   HDL 48.80 05/18/2014   LDLDIRECT 69.5 01/24/2011   LDLCALC 157* 05/18/2014   ALT 23 08/05/2015   AST 26 08/05/2015   NA 136 08/05/2015   K 4.2 08/05/2015   CL 103 08/05/2015   CREATININE 0.64 08/05/2015   BUN 12 08/05/2015   CO2 26 08/05/2015   TSH 2.031 08/04/2015   INR 1.15 08/04/2015   HGBA1C 6.5 02/21/2015   MICROALBUR <0.7 02/21/2015    No results found.     Assessment & Plan:   Oral candidiasis - nystatin swish and swallow - erx done  Problem List Items Addressed This Visit    DM (diabetes mellitus), type 2, uncontrolled [facility]    Prev followed with endo ([facility]) for same - but now follows here only Reviewed home cbgs - range 70-200s Added NPH 11/2013 to ongoing TID aspart - no significant change Changed NPH to Levemir 01/2014 because spouse on same - improved readings, but concerned about weight gain Added Victoza 07/2014 for DM and to benefit weight reduction goals, but unable to tolerate same because of GI side effects  Pt understands we will refer back to endo (outside provider) if unsuccessful managing same here Reviewed insulin resistance and poor tolerance of many oral medications in prior trials Add QHS in addition to meal coverage Novolog with  ongoing Levemir associated with neuropathy in feet - but intol of prior TCA, gaba and Lyrica trials -reviewed various TCAs and tx since 03/2014 per neuro - tolerating cymbalta with some benefit>side effect - titrate as tolerated - see instructions Check a1c q74moand prn  Lab Results  Component Value Date   HGBA1C 6.5 02/21/2015        Relevant Medications   insulin aspart (NOVOLOG) 100 UNIT/ML FlexPen   Other Relevant Orders   Hemoglobin A1c   Lipid panel   Dyslipidemia    Check lipids now -  intol of atorva or crestor low dose or qod dosing Also reviewed poor tol of simva in past      Relevant Medications   cholestyramine (QUESTRAN) 4 G packet   Small fiber neuropathy ([facility]    Long hx  same - affects hands and feet Consult by neuro (Posey Pronto reviewed 2016: Small fiber neuropathy on NCV with EMG Multiple medication trials for remediation of same with poor tolerance to all including amitriptyline, nortriptyline, gabapentin, Cymbalta & Lyrica Also evaluated by rheumatology June 2016 ([place]): No evidence for autoimmune or secondary rheumatologic process contributing to nonspecific symptoms At suggestion of neurology per note March 2016, tried Depakote therapy for symptom alleviation but not tolerated On cymbalta again since summer 2016 - benefit>side effect (dryness) so try titration now we reviewed potential risk/benefit and possible side effects - pt understands and agrees to same  Workup review, education & reassurance provided      Relevant Medications   DULoxetine (CYMBALTA) 60 MG capsule   DULoxetine (CYMBALTA) 30 MG capsule    Other Visit Diagnoses    Oral candidiasis        Relevant Medications    nystatin (MYCOSTATIN) 100000 UNIT/ML suspension        VGwendolyn Grant MD

## 2015-08-29 NOTE — Addendum Note (Signed)
Addended by: Aviva Signs M on: 08/29/2015 03:10 PM   Modules accepted: Orders

## 2015-08-29 NOTE — Assessment & Plan Note (Signed)
Prev followed with endo ([facility]) for same - but now follows here only Reviewed home cbgs - range 70-200s Added NPH 11/2013 to ongoing TID aspart - no significant change Changed NPH to Levemir 01/2014 because spouse on same - improved readings, but concerned about weight gain Added Victoza 07/2014 for DM and to benefit weight reduction goals, but unable to tolerate same because of GI side effects  Pt understands we will refer back to endo (outside provider) if unsuccessful managing same here Reviewed insulin resistance and poor tolerance of many oral medications in prior trials Add QHS in addition to meal coverage Novolog with ongoing Levemir associated with neuropathy in feet - but intol of prior TCA, gaba and Lyrica trials -reviewed various TCAs and tx since 03/2014 per neuro - tolerating cymbalta with some benefit>side effect - titrate as tolerated - see instructions Check a1c q48moand prn  Lab Results  Component Value Date   HGBA1C 6.5 02/21/2015

## 2015-08-29 NOTE — Assessment & Plan Note (Signed)
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 Multiple medication trials for remediation of same with poor tolerance to all including amitriptyline, nortriptyline, gabapentin, Cymbalta & Lyrica Also evaluated by rheumatology June 2016 ([place]): No evidence for autoimmune or secondary rheumatologic process contributing to nonspecific symptoms At suggestion of neurology per note March 2016, tried Depakote therapy for symptom alleviation but not tolerated On cymbalta again since summer 2016 - benefit>side effect (dryness) so try titration now we reviewed potential risk/benefit and possible side effects - pt understands and agrees to same  Workup review, education & reassurance provided

## 2015-08-29 NOTE — Progress Notes (Signed)
Pre visit review using our clinic review tool, if applicable. No additional management support is needed unless otherwise documented below in the visit note. 

## 2015-08-29 NOTE — Telephone Encounter (Signed)
Per Brittany's notes: Order changed, associated and signed.

## 2015-08-29 NOTE — Addendum Note (Signed)
Addended by: Aviva Signs M on: 08/29/2015 12:04 PM   Modules accepted: Orders

## 2015-08-29 NOTE — Patient Instructions (Addendum)
It was good to see you today.  We have reviewed your prior records including labs and tests today  Annual flu shot updated today  Test(s) ordered today. Your results will be released to [place] (or called to you) after review, usually within 72hours after test completion. If any changes need to be made, you will be notified at that same time.  Medications reviewed and updated -Add Novolog 30 units at bedtime with Lantus dose - Continue other Novolg at breakfast, lunch and dinner as ongoing -Add Cymbalta 17m to ongoing 653mdaily (total  9057maily goal) for efforts to control numbness pain - if throat dryness worse, or if hand symptoms not improved, resume only 15m7mily -Use nystatin swish and swallow for throat as needed No other changes recommended at this time.  Your prescription(s) have been submitted to your pharmacy. Please take as directed and contact our office if you believe you are having problem(s) with the medication(s).  Please schedule followup in 3-4 months with Dr. BurnQuay Burowyour new PCP, call sooner if problems.

## 2015-09-01 ENCOUNTER — Other Ambulatory Visit: Payer: Self-pay | Admitting: Internal Medicine

## 2015-09-05 ENCOUNTER — Encounter (HOSPITAL_COMMUNITY)
Admission: RE | Admit: 2015-09-05 | Discharge: 2015-09-05 | Disposition: A | Discharge status: Home / Self Care | Payer: Medicare Other | Source: Ambulatory Visit | Attending: Internal Medicine | Admitting: Internal Medicine

## 2015-09-05 DIAGNOSIS — K5190 Ulcerative colitis, unspecified, without complications: Secondary | ICD-10-CM | POA: Insufficient documentation

## 2015-09-05 MED ORDER — SODIUM CHLORIDE 0.9 % IV SOLN
INTRAVENOUS | Status: DC
Start: 2015-09-05 — End: 2015-09-06
  Administered 2015-09-05: 200 mL via INTRAVENOUS

## 2015-09-05 MED ORDER — ACETAMINOPHEN 325 MG PO TABS
ORAL_TABLET | ORAL | Status: AC
  Filled 2015-09-05: qty 2

## 2015-09-05 MED ORDER — ACETAMINOPHEN 325 MG PO TABS
650.0000 mg | ORAL_TABLET | Freq: Once | ORAL | Status: AC
  Administered 2015-09-05: 650 mg via ORAL

## 2015-09-05 MED ORDER — LORATADINE 10 MG PO TABS
ORAL_TABLET | ORAL | Status: AC
  Filled 2015-09-05: qty 1

## 2015-09-05 MED ORDER — LORATADINE 10 MG PO TABS
10.0000 mg | ORAL_TABLET | Freq: Every day | ORAL | Status: DC
  Administered 2015-09-05: 10 mg via ORAL

## 2015-09-05 MED ORDER — SODIUM CHLORIDE 0.9 % IV SOLN
500.0000 mg | INTRAVENOUS | Status: DC
  Administered 2015-09-05: 500 mg via INTRAVENOUS
  Filled 2015-09-05: qty 50

## 2015-09-06 ENCOUNTER — Telehealth (INDEPENDENT_AMBULATORY_CARE_PROVIDER_SITE_OTHER): Payer: Self-pay | Admitting: *Deleted

## 2015-09-06 ENCOUNTER — Telehealth: Payer: Self-pay | Admitting: *Deleted

## 2015-09-06 DIAGNOSIS — R197 Diarrhea, unspecified: Secondary | ICD-10-CM

## 2015-09-06 DIAGNOSIS — IMO0001 Reserved for inherently not codable concepts without codable children: Secondary | ICD-10-CM

## 2015-09-06 DIAGNOSIS — R140 Abdominal distension (gaseous): Secondary | ICD-10-CM

## 2015-09-06 DIAGNOSIS — D72820 Lymphocytosis (symptomatic): Secondary | ICD-10-CM

## 2015-09-06 MED ORDER — RIFAXIMIN 550 MG PO TABS: 550.0000 mg | ORAL_TABLET | Freq: Three times a day (TID) | ORAL | Status: DC

## 2015-09-06 MED ORDER — NEXIUM 40 MG PO CPDR: 40.0000 mg | DELAYED_RELEASE_CAPSULE | Freq: Every day | ORAL | Status: DC

## 2015-09-06 NOTE — Telephone Encounter (Signed)
Patient calls in and states she is having horrible gas and can't even walk across the floor without expelling gas.  She is taking the Cholestyramine powder at night and this seems to be worse.

## 2015-09-06 NOTE — Telephone Encounter (Signed)
Left msg on triage stating [facility] not send her Novolog flex pen needing someone to send rx ASAP almost out of insulin. [facility] spoke with Jamie/pharmacist verified if they received scripts for 11/29. Per Roselyn Reef stated yes but was wanting to verify directions. Inform her pt take 130 units four times a day. Called pt inform her the status on Novolog, also she states she need to get her nexium. Inform will send electronically...[name]

## 2015-09-06 NOTE — Telephone Encounter (Signed)
Per Dr.Rehman may call in Xifaxan 550 mg -Take 1 by mouth 3 times daily for 2 weeks. She is to keep a symptom diary every day for 2 weeks. Order a Celiac Antibody Panel.

## 2015-09-07 ENCOUNTER — Ambulatory Visit (HOSPITAL_BASED_OUTPATIENT_CLINIC_OR_DEPARTMENT_OTHER): Payer: Medicare Other | Attending: Internal Medicine | Admitting: Radiology

## 2015-09-07 VITALS — Ht 62.0 in | Wt 200.0 lb

## 2015-09-07 DIAGNOSIS — G4733 Obstructive sleep apnea (adult) (pediatric): Secondary | ICD-10-CM | POA: Insufficient documentation

## 2015-09-07 DIAGNOSIS — E669 Obesity, unspecified: Secondary | ICD-10-CM | POA: Insufficient documentation

## 2015-09-07 DIAGNOSIS — R0683 Snoring: Secondary | ICD-10-CM | POA: Insufficient documentation

## 2015-09-07 DIAGNOSIS — I4891 Unspecified atrial fibrillation: Secondary | ICD-10-CM | POA: Insufficient documentation

## 2015-09-07 DIAGNOSIS — Z6837 Body mass index (BMI) 37.0-37.9, adult: Secondary | ICD-10-CM | POA: Diagnosis not present

## 2015-09-07 DIAGNOSIS — I10 Essential (primary) hypertension: Secondary | ICD-10-CM

## 2015-09-07 DIAGNOSIS — E119 Type 2 diabetes mellitus without complications: Secondary | ICD-10-CM | POA: Diagnosis not present

## 2015-09-07 NOTE — Telephone Encounter (Signed)
Patient was called and made aware.

## 2015-09-12 DIAGNOSIS — G4733 Obstructive sleep apnea (adult) (pediatric): Secondary | ICD-10-CM | POA: Diagnosis not present

## 2015-09-13 ENCOUNTER — Ambulatory Visit: Payer: Medicare Other | Admitting: Cardiology

## 2015-09-13 ENCOUNTER — Ambulatory Visit (INDEPENDENT_AMBULATORY_CARE_PROVIDER_SITE_OTHER): Payer: Self-pay | Admitting: Neurology

## 2015-09-13 ENCOUNTER — Ambulatory Visit (INDEPENDENT_AMBULATORY_CARE_PROVIDER_SITE_OTHER): Payer: Medicare Other | Admitting: Neurology

## 2015-09-13 DIAGNOSIS — G629 Polyneuropathy, unspecified: Secondary | ICD-10-CM

## 2015-09-13 DIAGNOSIS — G6289 Other specified polyneuropathies: Secondary | ICD-10-CM

## 2015-09-13 DIAGNOSIS — Z0289 Encounter for other administrative examinations: Secondary | ICD-10-CM

## 2015-09-13 NOTE — Procedures (Signed)
  [facility]    Provider:  Dr Jaynee Eagles Referring Provider: Rowe Clack, MD Primary Care Physician:  Binnie Rail, MD   HPI:  [name] is a 69 y.o. female here as a referral from Dr. Asa Lente and Dr. Erlinda Hong for neuropathy. 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    Summary  Nerve conduction studies were performed on the bilateral upper and left lower extremities:  The bilateral Median motor nerves showed normal conductions with normal F Wave latency The bilateral Ulnar motor nerves showed normal conductions with normal F Wave latency The left Peroneal motor nerve showed normal conductions with normal F Wave latency The left Tibial motor nerve showed normal conductions with normal F Wave latency The bilateral second-digit Median sensory nerve conductions were within normal limits The bilateral fifth-digit Ulnar sensory nerve conductions were within normal limits The left Sural sensory nerve conduction was within normal limits Bilateral H Reflexes showed normal latencies The bilateral Median/Ulnar (palm) comparison nerve showed normal peak latency difference  EMG Needle study was performed on selected left upper and left lower extremity muscles:   The Abductor Hallucis showed increased spontaneous activity (+3 positive waves and fibrillation potentials). The Deltoid, Triceps, Pronator Teres, Opponens Pollicis, First Dorsal interosseous, Biceps Femoris (long head), Gluteus Maximus, Gluteus medius, Vastus Medialis, Anterior Tibialis, Medial Gastrocnemius, Extensor Hallucis Longus muscles were within normal limits.  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 No suggestion of ulnar or median neuropathy or radiculopathy. Clinical correlation recommended.     [name], MD  [facility] [address]  [place], [place] [address]  Phone [phone] Fax [phone]

## 2015-09-13 NOTE — Progress Notes (Signed)
  [facility]    Provider:  Dr Jaynee Eagles Referring Provider: Rowe Clack, MD Primary Care Physician:  Binnie Rail, MD   HPI:  [name] is a 69 y.o. female here as a referral from Dr. Asa Lente and Dr. Erlinda Hong for neuropathy. 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    Summary  Nerve conduction studies were performed on the bilateral upper and left lower extremities:  The bilateral Median motor nerves showed normal conductions with normal F Wave latency The bilateral Ulnar motor nerves showed normal conductions with normal F Wave latency The left Peroneal motor nerve showed normal conductions with normal F Wave latency The left Tibial motor nerve showed normal conductions with normal F Wave latency The bilateral second-digit Median sensory nerve conductions were within normal limits The bilateral fifth-digit Ulnar sensory nerve conductions were within normal limits The left Sural sensory nerve conduction was within normal limits Bilateral H Reflexes showed normal latencies The bilateral Median/Ulnar (palm) comparison nerve showed normal peak latency difference  EMG Needle study was performed on selected left upper and left lower extremity muscles:   The Abductor Hallucis showed increased spontaneous activity (+3 positive waves and fibrillation potentials). The Deltoid, Triceps, Pronator Teres, Opponens Pollicis, First Dorsal interosseous, Biceps Femoris (long head), Gluteus Maximus, Gluteus medius, Vastus Medialis, Anterior Tibialis, Medial Gastrocnemius, Extensor Hallucis Longus muscles were within normal limits.  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 No suggestion of ulnar or median neuropathy or radiculopathy. Clinical correlation recommended.     [name], MD  [facility] [address]  [place], [place] [address]  Phone [phone] Fax [phone]

## 2015-09-13 NOTE — Progress Notes (Signed)
Patient Name: [name], [name] Date: 09/07/2015 Gender: Female D.O.B: [birth year] Age (years): 69 Referring Provider: [name] Height (inches): 54 Interpreting Physician: Chesley Mires MD, ABS[facility] Weight (lbs): 200 RPSGT: Zadie Rhine BMI: 37 MRN: [id number] Neck Size: 17.00  CLINICAL INFORMATION Sleep Study Type: NPSG   Indication for sleep study: She has hx of DM, A fib.  She has snoring, sleep disruption, and daytime sleepiness.   Epworth Sleepiness Score: 10   SLEEP STUDY TECHNIQUE As per the AASM Manual for the Scoring of Sleep and Associated Events v2.3 (April 2016) with a hypopnea requiring 4% desaturations. The channels recorded and monitored were frontal, central and occipital EEG, electrooculogram (EOG), submentalis EMG (chin), nasal and oral airflow, thoracic and abdominal wall motion, anterior tibialis EMG, snore microphone, electrocardiogram, and pulse oximetry.  MEDICATIONS Patient's medications include: reviewed in electronic medical record. Medications self-administered by patient during sleep study : No sleep medicine administered.  SLEEP ARCHITECTURE The study was initiated at 10:31:23 PM and ended at 4:58:37 AM. Sleep onset time was 32.2 minutes and the sleep efficiency was 70.1%. The total sleep time was 271.5 minutes. Stage REM latency was 257.0 minutes. The patient spent 4.42% of the night in stage N1 sleep, 60.96% in stage N2 sleep, 10.13% in stage N3 and 24.49% in REM. Alpha intrusion was absent. Supine sleep was 89.48%.  RESPIRATORY PARAMETERS The overall apnea/hypopnea index (AHI) was 72.0 per hour. There were 23 total apneas, including 23 obstructive, 0 central and 0 mixed apneas. There were 303 hypopneas and 12 RERAs. The AHI during Stage REM sleep was 82.1 per hour. AHI while supine was 73.4 per hour. The mean oxygen saturation was 93.45%. The minimum SpO2 during sleep was 75.00%. Soft snoring was noted during this study.  CARDIAC DATA The 2  lead EKG demonstrated sinus rhythm. The mean heart rate was 70.56 beats per minute. Other EKG findings include: None.  LEG MOVEMENT DATA The total PLMS were 0 with a resulting PLMS index of 0.00. Associated arousal with leg movement index was 0.0 .  IMPRESSIONS This study shows severe obstructive sleep apnea with an AHI of 72 and SaO2 low of 75%.  DIAGNOSIS - Obstructive Sleep Apnea (327.23 [G47.33 ICD-10])  RECOMMENDATIONS In addition to weight loss, given the severity of her sleep apnea she should under CPAP therapy.  This could be set up through in lab CPAP titration study, or using auto CPAP set up at home with pressure range 5 to 15 cm H2O.  Alternative would be to arrange for sleep medicine evaluation.   Chesley Mires, MD, [place], American Board of Sleep Medicine 09/13/2015, 3:13 PM  NPI: [id number]

## 2015-09-13 NOTE — Progress Notes (Signed)
See procedure note.

## 2015-09-14 ENCOUNTER — Encounter: Payer: Self-pay | Admitting: Internal Medicine

## 2015-09-14 DIAGNOSIS — G4733 Obstructive sleep apnea (adult) (pediatric): Secondary | ICD-10-CM

## 2015-09-14 HISTORY — DX: Obstructive sleep apnea (adult) (pediatric): G47.33

## 2015-09-15 ENCOUNTER — Telehealth: Payer: Self-pay

## 2015-09-15 NOTE — Telephone Encounter (Signed)
Rn call patient to let him know that the nerve conduction test was normal. Also it did not show any impingement pattern. Pt verbalized understanding of test.

## 2015-09-15 NOTE — Telephone Encounter (Signed)
-----   Message from Rosalin Hawking, MD sent at 09/15/2015  8:16 AM EST ----- Could you please let the patient know that her recent nerve conduction test done in our office showed neuropathy pattern. She has appointment with Dr. Jaynee Eagles on 09/20/15 to discuss about it. Please keep the appointment. Thanks.  Rosalin Hawking, MD PhD Stroke Neurology 09/15/2015 8:16 AM

## 2015-09-20 ENCOUNTER — Ambulatory Visit (INDEPENDENT_AMBULATORY_CARE_PROVIDER_SITE_OTHER): Payer: Medicare Other | Admitting: Neurology

## 2015-09-20 VITALS — BP 145/65 | HR 68 | Wt 210.6 lb

## 2015-09-20 DIAGNOSIS — G629 Polyneuropathy, unspecified: Secondary | ICD-10-CM | POA: Diagnosis not present

## 2015-09-20 MED ORDER — GABAPENTIN (ONCE-DAILY) 600 MG PO TABS: 600.0000 mg | ORAL_TABLET | Freq: Every day | ORAL | Status: DC

## 2015-09-20 NOTE — Patient Instructions (Signed)
Remember to drink plenty of fluid, eat healthy meals and do not skip any meals. Try to eat protein with a every meal and eat a healthy snack such as fruit or nuts in between meals. Try to keep a regular sleep-wake schedule and try to exercise daily, particularly in the form of walking, 20-30 minutes a day, if you can.   As far as your medications are concerned, I would like to suggest: Start with Gralise 330m at night. Then increase to 6014mat night.  Call or email in 14 days and let usKoreanow  Our phone number is [phone] also have an after hours call service for urgent matters and there is a physician on-call for urgent questions. For any emergencies you know to call 911 or go to the nearest emergency room

## 2015-09-20 NOTE — Progress Notes (Signed)
[facility]    Provider:  Dr Jaynee Eagles Referring Provider: Binnie Rail, MD Primary Care Physician:  Binnie Rail, MD  CC:  Peripheral polyneuropathy, diagnosed as small-fiber neuropathy  HPI:  [name] is a 69 y.o. female here as a referral from Dr. Quay Burow for peripheral polyneuropathy. She has been seen by multiple neurologists including Dr. Posey Pronto and Dr. Erlinda Hong and diagnosed with small-fiber neuropathy. She is here for pain management and she has failed multiple medications due to side effects and she is very upset and frustrated that the doctors cannot find something to work for her without side effects. Dr. Posey Pronto recommended pain clinic. Husband is here with her and had a long discussion about peripheral neuropathy, expressed that it can be very painful and indeed frustrating. Unfortunately medications carry side effects. She is on cymbalta and she has side effects. She wakes up at night confused. She was having frigtening. dreams, insomnia. Patient tried Gabapentin and can't remember what happened and she was on a low dose but could not tolerate. Tried Amitriptyline , Nortriptyline. Venlafaxine with nausea. The pain is tolerable with the cymbalta. She is still on the Cymbalta but the side effects are upsetting. . She is having balance problems. No cramping.The pain isWorse at night when she gets still. She has tried Tramadol and it is not strong enough for her. She has tried Aspercreme with lidocaine. No shoes make her comfortable. She is upset and in pain. Discussed we can try placing her on lower doses of multiple medications to see if we can get her some relief with less side effects however pain clinic may be the best place for her to be seen next.   Reviewed notes, labs and imaging from outside physicians, which showed:  Emg/ncs 09/13/2015: There is electrophysiologic evidence for a length-dependent axonal neuropathy. No suggestion of ulnar or median neuropathy or  radiculopathy. Clinical correlation recommended.  HPI Dr Erlinda Hong 08/17/2015: [name] is a 69 y.o. 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 Posey Pronto in [facility] who presents as a new patient for second opinion about neuropathy.   She stated that about 5 years ago, she had CTS symptoms at both hands with hands went to sleep at night, painful hands including palms and fingers, she had to wear braces at night, but eventually she had b/l CTS surgery and symptoms resolved. About 2 years ago, she started to have burning sensation of both hands but only distal to proximal interphalangeal joints. Not the same feeling as her CTS in the past, but she was evaluated by orthopedics and EMG done at that time showed right mild to moderate CTS and left mild CTS, with no polyneuropathy. She then had second bilateral CTS surgery but this time did not work that much. She continued to have bilateral finger burning distal to PI joints. She also complains tingling sensation of her toes bilaterally for the last 5 years. She was referred to Dr. Posey Pronto at [facility] and had neuropathy work up with negative heavy metal, autoimmune tests, and diagnosed with small fiber neuropathy. She has since tried multiple medications including gabapentin which does not work at all, Consulting civil engineer which is expensive and not tolerating, amitriptyline and nortriptyline which made pt not able to function, lidocaine cream which is not working, and cymbalta which pt found is the only one that can help some. Currently, she is on cymbalta  66m daily, but stated that she is very sleepy with this medication, and she can not drive or doing something needs constant attention.   She has hx of DM, on levemir, last A1C in august was 6.5. Currently glucose ranging from 75-160s. She was just found to have afib one week ago, she was put on  amiodarone and Xarelto. She has hx of ulcerative colitis, on ramicade injection every 2 months, UC is in remission. She does not feel ramicade making her neuropathy worse. Her lymphocytes does increased after ramicade but she is following with oncology for that and so far just observation.   A/P Dr XErlinda Hong11/17/2016:  In summary, WALIYANA DLUGOSZis a 69y.o. 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 who presents as a new patient for second opinion about neuropathy. Her symptoms 5 years ago more consistent with CTS and surgery helped her. But the recent symptoms did not consistent with CTS and 2nd surgery did not help. Her burning sensation distal to PI joint bilaterally and toe tingling bilaterally more consistent with polyneuropathy although EMG 2014 did not show that. However, she does have DM and ulcerative colitis so potentially she can have secondary neuropathy due to those. Small fiber neuropathy is of course in the DDx, previous work up did not show underlying disease. But will check SPEP and repeat EMG. Will refer to my colleague also neuromuscular specialist Dr. AJaynee Eaglesfor follow up.   - continue cymbalta for neuropathy for now - will repeat EMG/NCS - will check SPEP, B1, ANCA which have not been checked before (were all negative) - continue Xarelto for stroke prevention given afib - follow up in 2 months with our neuromuscular specialist Dr. AJaynee Eagles Review of Systems: Patient complains of symptoms per HPI as well as the following symptoms: no CP or SOB. Pertinent negatives per HPI. All others negative.   Social History   Social History  . Marital Status: Married    Spouse Name: N/A  . Number of Children: N/A  . Years of Education: N/A   Occupational History  . Not on file.   Social History Main Topics  . Smoking status: Never Smoker   .  Smokeless tobacco: Never Used  . Alcohol Use: 0.0 oz/week    0 Standard drinks or equivalent per week     Comment: very rare  . Drug Use: No  . Sexual Activity:    Partners: Male    Birth Control/ Protection: Surgical     Comment: hysterectomy   Other Topics Concern  . Not on file   Social History Narrative   She lives with husband in two-story home.  They have one grown son and 2 grandchildren.   She is retired [employer]   Highest of level education:  Some college    Family History  Problem Relation Age of Onset  . Diabetes Mother   . Hypertension Mother   . Heart attack Father     Mid 63's . Heart disease Father   . Lung disease Father     spot on lung; had lung surgery  . Alcohol abuse Other   . Hypertension Son   . Diabetes Son   . Neuropathy Neg Hx     Past Medical History  Diagnosis Date  . GERD   . Osteoarthritis   . HYPERTENSION   . IDDM (insulin  dependent diabetes mellitus) ([place])   . Ulcerative colitis (HCC)     Remicade infusion Q 8 weeks  . History of thrombocytopenia 12/2011  . 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 Dental bridge present     lower  . Anxiety   . Complication of anesthesia   . OSA (obstructive sleep apnea) 09/14/2015    sleep study 09/07/15: severe obstructive sleep apnea with an AHI of 72 and SaO2 low of 75%.>refer to sleep med for eval and tx options    Past Surgical History  Procedure Laterality Date  . Abdominal hysterectomy  1980    partial  . Breast reduction surgery  1994  . Rectocele repair  1990; 09/12/2006  . Carpal tunnel release Right 1996  . Knee arthroscopy Right 01/1999; 10/2000  . Hemilaminotomy lumbar spine Bilateral 09/07/1999    L4-5  . Cholecystectomy    . Flexible sigmoidoscopy  01/17/2012    Procedure: FLEXIBLE SIGMOIDOSCOPY;  Surgeon: Rogene Houston, MD;  [facility];  Service: Endoscopy;  Laterality: N/A;  . Bilateral salpingoophorectomy  02/10/2001  . Tarsal  tunnel release  2002  . Ureterolysis Right 02/10/2001  . Lysis of adhesion  02/10/2001  . Carpal tunnel release Left 03/21/2003  . 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 Esophagogastroduodenoscopy (egd) with esophageal dilation  12/02/2005  . Carpal tunnel release Right 05/04/2013    Procedure: CARPAL TUNNEL RELEASE;  Surgeon: Cammie Sickle., MD;  Location: [facility];  Service: Orthopedics;  Laterality: Right;  . Carpal tunnel release Left 09/21/2013    Procedure: LEFT CARPAL TUNNEL RELEASE;  Surgeon: Cammie Sickle., MD;  Location: [facility];  Service: Orthopedics;  Laterality: Left;  . Bacterial overgrowth test N/A 07/13/2015    Procedure: BACTERIAL OVERGROWTH TEST;  Surgeon: Rogene Houston, MD;  [facility]: [facility];  Service: Endoscopy;  Laterality: N/A;  730    . Esophagogastroduodenoscopy (egd) with propofol N/A 08/04/2015    Procedure: ESOPHAGOGASTRODUODENOSCOPY (EGD) WITH PROPOFOL;  Surgeon: Rogene Houston, MD;  Location: [facility];  Service: Endoscopy;  Laterality: N/A;  procedure 1  . Esophageal dilation N/A 08/04/2015    Procedure: ESOPHAGEAL DILATION;  Surgeon: Rogene Houston, MD;  Location: [facility];  Service: Endoscopy;  Laterality: N/A;  [place] 57, no mucousal disruption  . Colonoscopy with propofol N/A 08/04/2015    Procedure: COLONOSCOPY WITH PROPOFOL;  Surgeon: Rogene Houston, MD;  Location: [facility];  Service: Endoscopy;  Laterality: N/A;  cecum time in  0820   time out  0827    total time 7 minutes    Current Outpatient Prescriptions  Medication Sig Dispense Refill  . amiodarone (PACERONE) 200 MG tablet Take 1 tablet (200 mg total) by mouth daily. 90 tablet 3  . azelastine (ASTELIN) 0.1 % nasal spray Place 2 sprays into both nostrils 2 (two) times daily. 30 mL 1  . B-D UF III MINI PEN NEEDLES 31G X 5 MM MISC USE 6 TIMES DAILY AS DIRECTED 540 each 2  . cholestyramine (QUESTRAN) 4 G packet Take 1 packet (4 g total)  by mouth 2 (two) times daily. 180 each 3  . cromolyn (OPTICROM) 4 % ophthalmic solution Place 1 drop into both eyes as needed.     . DULoxetine (CYMBALTA) 30 MG capsule Take 1 capsule (30 mg total) by mouth daily. Take with 43m for total 963md 90  capsule 1  . DULoxetine (CYMBALTA) 60 MG capsule Take 1 capsule (60 mg total) by mouth daily. Take with 30m for total 96mday 90 capsule 1  . furosemide (LASIX) 40 MG tablet Take 1 tablet (40 mg total) by mouth daily. 90 tablet 1  . Gabapentin, Once-Daily, (GRALISE) 600 MG TABS Take 600 mg by mouth at bedtime. 90 tablet 9  . glucose blood test strip Use as instructed to test blood sugar four times per day. DX E11.09 500 each 1  . inFLIXimab (REMICADE) 100 MG injection Inject into the vein every 8 (eight) weeks.     . insulin aspart (NOVOLOG) 100 UNIT/ML FlexPen Inject 130 Units into the skin 4 (four) times daily -  before meals and at bedtime. 120 pen 1  . Insulin Detemir (LEVEMIR) 100 UNIT/ML Pen Inject 70 Units into the skin at bedtime. 60 pen 1  . Insulin Pen Needle (PEN NEEDLES 31GX5/16") 31G X 8 MM MISC Use pen needles with insulin pens to inject insulin as directed. DX E11.09 600 each 1  . lactase (LACTAID) 3000 UNITS tablet Take 3,000 Units by mouth as needed.     . loperamide (IMODIUM) 2 MG capsule Take 2 mg by mouth 3 (three) times daily as needed for diarrhea or loose stools.     . meclizine (ANTIVERT) 25 MG tablet Take 1 tablet (25 mg total) by mouth 3 (three) times daily as needed for dizziness or nausea. 90 tablet 0  . metoprolol succinate (TOPROL-XL) 100 MG 24 hr tablet Take 1 tablet (100 mg total) by mouth daily. 90 tablet 1  . NEXIUM 40 MG capsule Take 1 capsule (40 mg total) by mouth daily. 90 capsule 3  . nystatin (MYCOSTATIN) 100000 UNIT/ML suspension Take 5 mLs (500,000 Units total) by mouth 3 (three) times daily as needed. 240 mL 0  . ONETOUCH DELICA LANCETS 3305LISC PATIENT USES ONE TOUCH DELICAL 3097QANCETS. Use as directed to  check blood sugar up to 6 times per day. DX: E11.09 500 each 3  . potassium chloride (KLOR-CON M10) 10 MEQ tablet Take 2 tablets (20 mEq total) by mouth daily. 180 tablet 3  . rifaximin (XIFAXAN) 550 MG TABS tablet Take 1 tablet (550 mg total) by mouth 3 (three) times daily. 42 tablet 0  . rivaroxaban (XARELTO) 20 MG TABS tablet Take 1 tablet (20 mg total) by mouth daily with supper. 90 tablet 1  . Saccharomyces boulardii (FLORASTOR PO) Take 1 capsule by mouth daily.     . [DISCONTINUED] amitriptyline (ELAVIL) 25 MG tablet Take 1 tablet (25 mg total) by mouth at bedtime. start with 1/2 tab at night for 1 week then increase to 1 whole tab. 30 tablet 3  . [DISCONTINUED] bromocriptine (PARLODEL) 2.5 MG tablet Take 2.5 mg by mouth 2 (two) times daily.      . [DISCONTINUED] potassium chloride (K-DUR) 10 MEQ tablet Take 2 tablets (20 mEq total) by mouth daily. 90 tablet 1   No current facility-administered medications for this visit.    Allergies as of 09/20/2015 - Review Complete 09/20/2015  Allergen Reaction Noted  . Actos [pioglitazone] Other (See Comments) 03/02/2012  . Benzocaine-menthol Swelling 06/26/2011  . Colesevelam Other (See Comments) 10/18/2010  . Flagyl [metronidazole hcl] Other (See Comments) 01/19/2012  . Metformin and related Diarrhea 03/02/2012  . Omeprazole Swelling 08/14/2011  . Shrimp [shellfish allergy] Itching 04/27/2013  . Statins Other (See Comments)   . Desipramine hcl Itching, Nausea Only, and Other (See Comments) 04/26/2014  .  Hydromorphone Itching 06/08/2013  . Adhesive [tape] Other (See Comments) 04/27/2013  . Nisoldipine Itching 04/12/2010  . Percocet [oxycodone-acetaminophen] Itching 02/18/2012    Vitals: BP 145/65 mmHg  Pulse 68  Wt 210 lb 9.6 oz (95.528 kg)  SpO2 98% Last Weight:  Wt Readings from Last 1 Encounters:  09/20/15 210 lb 9.6 oz (95.528 kg)   Last Height:   Ht Readings from Last 1 Encounters:  09/07/15 5' 2"  (1.575 m)    Focused  neurologic exam: Strength is intact throughout, reflexes intact throughout, wide-based gait, dec pp and temp sensation distally in the feet with absent vibration.      Assessment/Plan:  ARLYCE CIRCLE is a 69 y.o. 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 Posey Pronto in [facility] who presented as a new patient for Dr. Erlinda Hong in our practice  second opinion about neuropathy. She has been diagnosed with small-fiber neuropathy. Extensive lab testing completed and negative except for hgba1c 6.6 (was 8.3) in the past likely diabetic etiology but ulcerative colitis is also a risk factor for neuropathy. Strict glucose control is recommended.    She is here for pain management and she has failed multiple medications due to side effects and she is very upset and frustrated that the doctors cannot find something to work for her without side effects. Had a long discussion about peripheral neuropathy, expressed that it can be very painful and indeed frustrating. Unfortunately medications carry side effects. She is on cymbalta currently with side effects although it is helping. Patient tried Gabapentin and can't remember what happened and she was on a low dose but could not tolerate. Tried Amitriptyline , Nortriptyline can't remember why she stopped. . She has tried Tramadol and it is not strong enough for her. Venlafaxine gave her nausea. She has tried Aspercreme with lidocaine. No shoes make her comfortable. She is upset and in pain. Discussed we can try placing her on lower doses of multiple medications to see if we can get her some relief with less side effects however pain clinic may be the best place for her to be seen next. Continue cymbalta, if Gralise helps can decrease cymbalta to try and reduce side effects, then can try adding back on nortriptyline at low dose 64m.      ASarina Ill  MD  [facility] [address] [place]  [address] Phone [phone] [phone] A total of 45 minutes was spent face-to-face with this patient. Over half this time was spent on counseling patient on the peripheral neuropathy diagnosis and different diagnostic and therapeutic options available.

## 2015-09-22 ENCOUNTER — Telehealth: Payer: Self-pay | Admitting: Internal Medicine

## 2015-09-22 NOTE — Telephone Encounter (Signed)
Pt called in and would like to know the results of her sleep study

## 2015-09-22 NOTE — Telephone Encounter (Signed)
Pt informed that we do not have the results at this time.

## 2015-09-27 ENCOUNTER — Encounter: Payer: Self-pay | Admitting: Neurology

## 2015-10-06 ENCOUNTER — Telehealth (INDEPENDENT_AMBULATORY_CARE_PROVIDER_SITE_OTHER): Payer: Self-pay | Admitting: *Deleted

## 2015-10-06 NOTE — Telephone Encounter (Signed)
Patient has called in and states that she has completed the Xifaxan. It helped a little. Since she has completed, symptoms are worse. She cannot stay out of the bathroom. No clear answer from her about number of times she goes,ect.  Can't leave the house.  She request a refill on the medication.  Per Dr.Rehman - if patient is having diarrhea , will need to do a C-Diff. We need to know if the patient is still having 2-8 stools per day , if her first 1 st or 2 nd is still formed. No Xifaxan will be called in, as she was being treated for bloating , not until C-Diff. Also, if she is taking her Imodium on a schedule. Three times daily , four times daily? If she gets constipated, she may use a suppository or enema.  Patient was called back. She now states that she is not having diarrhea, just occasionally. First stools remain formed. Talked Imodium 1 every morning and this is working well for her. C-Diff cannot be done at this time,as stool will need to be liquid.  Forwarded to [place] for review.

## 2015-10-08 NOTE — Telephone Encounter (Signed)
Please asked patient to try gluten-free diet for a few weeks if possible.

## 2015-10-09 ENCOUNTER — Ambulatory Visit: Payer: Medicare Other | Admitting: Cardiology

## 2015-10-09 NOTE — Telephone Encounter (Signed)
Patient was called and ask to try the Gluten Free Diet if possible. She has agreed to do this. A copy of a diet will be sent to her.

## 2015-10-12 ENCOUNTER — Telehealth: Payer: Self-pay | Admitting: *Deleted

## 2015-10-12 NOTE — Telephone Encounter (Signed)
Thank you :)

## 2015-10-12 NOTE — Telephone Encounter (Signed)
Called pt back. Advised Dr Jaynee Eagles did not prescribe Cymbalta. Pt states this is what is causing hallucinations. [name] was her previous PCP and prescribed this. Billey Gosling her current PCP now. Advised she needs to call PCP to address this. Possibly go in for appt. They can also place referral to pain management if warranted. Told her to call office back if she has further questions/concerns. She verbalized understanding.

## 2015-10-17 ENCOUNTER — Ambulatory Visit: Payer: Medicare Other | Admitting: Neurology

## 2015-10-24 ENCOUNTER — Ambulatory Visit (INDEPENDENT_AMBULATORY_CARE_PROVIDER_SITE_OTHER): Payer: Medicare Other | Admitting: Internal Medicine

## 2015-10-24 ENCOUNTER — Encounter: Payer: Self-pay | Admitting: Internal Medicine

## 2015-10-24 VITALS — BP 134/78 | HR 98 | Temp 98.6°F | Resp 18 | Wt 215.0 lb

## 2015-10-24 DIAGNOSIS — IMO0002 Reserved for concepts with insufficient information to code with codable children: Secondary | ICD-10-CM

## 2015-10-24 DIAGNOSIS — E1165 Type 2 diabetes mellitus with hyperglycemia: Secondary | ICD-10-CM

## 2015-10-24 DIAGNOSIS — G4733 Obstructive sleep apnea (adult) (pediatric): Secondary | ICD-10-CM

## 2015-10-24 DIAGNOSIS — G629 Polyneuropathy, unspecified: Secondary | ICD-10-CM

## 2015-10-24 DIAGNOSIS — I10 Essential (primary) hypertension: Secondary | ICD-10-CM | POA: Diagnosis not present

## 2015-10-24 DIAGNOSIS — E118 Type 2 diabetes mellitus with unspecified complications: Secondary | ICD-10-CM

## 2015-10-24 DIAGNOSIS — Z794 Long term (current) use of insulin: Secondary | ICD-10-CM

## 2015-10-24 NOTE — Assessment & Plan Note (Signed)
Blood pressure well-controlled Continue current blood pressure medications

## 2015-10-24 NOTE — Progress Notes (Signed)
Subjective:    Patient ID: [name], female    DOB: 25-Jan-1946, 70 y.o.   MRN: [id number]  HPI She is here for an acute visit because of side effects from one of her medications.  Small fiber neuropathy:  She has a long history of neuropathy and it affects her hands and feet.  She has seen neurology in the past.  She has also seen rheumatology ( Dr Charlestine Night) and there was no autoimmune process for the neuropathy.  She has tried the following medications: amitriptyline, nortriptyline, gabapentin, depakote, cymbalta and lyrica.  She restarted cymbalta in the summer of 2016 because it was thought the benefits might outweigh the side effects (dryness).  She is currently on cymbalta only and it is causing her significant dryness and vivid dreams. She thinks she needs to come off of the medication.  Side effects from the medication include:  She has very dry throat, hoarseness and swelling in her throat.  She has very vivid dreams, fluctuating hot/cold feeling,difficulty falling asleep and once she is awake she feels groggy.  The medication has helped with the neuropathy in the hands, but she cannot tolerate side effects. She was started on gabapentin on top of the cymbalta and she felt worse.  So she stopped the gabapentin.  She wonders what other options that might be.  Diabetes: She is taking her medication daily as prescribed. If she goes to bed and her sugars are 200 she will wake up with a good sugar in the morning, but if she goes to bed with her sugar being around 150 she will wake up with a sugar is very well. Her sugar is on the lower side when she goes to bed she typically does not take her nighttime NovoLog. She is frustrated because she is not able to lose weight and she knows that will help. She is compliant with a diabetic diet. She has seen an endocrinologist in the past, but has not seen one in a very long time.  She would like to consider a medication that would help her lose weight and  myositis little help her control her sugars.  Hypertension: She is taking her medication daily. She is compliant with a low sodium diet.  She does have mild leg edema and has had some chest pain. She follows with cardiology. She denies palpitations, shortness of breath and regular headaches. She is exercising regularly.  She does not monitor her blood pressure at home.     She is concerned about her excessive flatulence. She has discussed with her gastroenterologist in all of her tests that she has had tonsillitis have been normal.  Medications and allergies reviewed with patient and updated if appropriate.  Patient Active Problem List   Diagnosis Date Noted  . OSA (obstructive sleep apnea) 09/14/2015  . B12 deficiency 08/17/2015  . Vitamin B6 deficiency 08/17/2015  . Paroxysmal atrial fibrillation ([place]) 08/17/2015  . Chronic anticoagulation 08/17/2015  . Atrial fibrillation with RVR ([place]) 08/04/2015  . Dysphagia   . Gastroesophageal reflux disease without esophagitis   . Lymphocytosis 07/04/2015  . Fatty liver 06/09/2014  . Morbidly obese ([place]) 05/18/2014  . Allergic sinusitis 12/09/2013  . Myalgia 04/28/2012  . Polyarthralgia 04/28/2012  . Elevated LFTs 01/16/2012  . Insomnia 01/12/2012  . Peripheral neuropathy ([place]) 12/05/2011  . Small fiber neuropathy ([place]) 05/30/2011  . Anxiety 05/30/2011  . Dyslipidemia 01/24/2011  . PALPITATIONS, RECURRENT 08/08/2010  . DM (diabetes mellitus), type 2, uncontrolled ([place]) 05/29/2010  .  Essential hypertension 04/12/2010  . GERD 04/12/2010  . Ulcerative colitis ([place]) 04/12/2010    Current Outpatient Prescriptions on File Prior to Visit  Medication Sig Dispense Refill  . amiodarone (PACERONE) 200 MG tablet Take 1 tablet (200 mg total) by mouth daily. 90 tablet 3  . azelastine (ASTELIN) 0.1 % nasal spray Place 2 sprays into both nostrils 2 (two) times daily. 30 mL 1  . B-D UF III MINI PEN NEEDLES 31G X 5 MM MISC USE 6 TIMES DAILY AS  DIRECTED 540 each 2  . cholestyramine (QUESTRAN) 4 G packet Take 1 packet (4 g total) by mouth 2 (two) times daily. 180 each 3  . cromolyn (OPTICROM) 4 % ophthalmic solution Place 1 drop into both eyes as needed.     . DULoxetine (CYMBALTA) 30 MG capsule Take 1 capsule (30 mg total) by mouth daily. Take with 69m for total 970md 90 capsule 1  . DULoxetine (CYMBALTA) 60 MG capsule Take 1 capsule (60 mg total) by mouth daily. Take with 3028mor total 11m63my 90 capsule 1  . furosemide (LASIX) 40 MG tablet Take 1 tablet (40 mg total) by mouth daily. 90 tablet 1  . Gabapentin, Once-Daily, (GRALISE) 600 MG TABS Take 600 mg by mouth at bedtime. 90 tablet 9  . glucose blood test strip Use as instructed to test blood sugar four times per day. DX E11.09 500 each 1  . inFLIXimab (REMICADE) 100 MG injection Inject into the vein every 8 (eight) weeks.     . insulin aspart (NOVOLOG) 100 UNIT/ML FlexPen Inject 130 Units into the skin 4 (four) times daily -  before meals and at bedtime. (Patient taking differently: Inject 130 Units into the skin 4 (four) times daily -  before meals and at bedtime. 130 units 3xs daily, 60 units at night) 120 pen 1  . Insulin Detemir (LEVEMIR) 100 UNIT/ML Pen Inject 70 Units into the skin at bedtime. 60 pen 1  . Insulin Pen Needle (PEN NEEDLES 31GX5/16") 31G X 8 MM MISC Use pen needles with insulin pens to inject insulin as directed. DX E11.09 600 each 1  . lactase (LACTAID) 3000 UNITS tablet Take 3,000 Units by mouth as needed.     . loperamide (IMODIUM) 2 MG capsule Take 2 mg by mouth 3 (three) times daily as needed for diarrhea or loose stools.     . meclizine (ANTIVERT) 25 MG tablet Take 1 tablet (25 mg total) by mouth 3 (three) times daily as needed for dizziness or nausea. 90 tablet 0  . metoprolol succinate (TOPROL-XL) 100 MG 24 hr tablet Take 1 tablet (100 mg total) by mouth daily. (Patient taking differently: Take 100 mg by mouth 2 (two) times daily. ) 90 tablet 1  .  NEXIUM 40 MG capsule Take 1 capsule (40 mg total) by mouth daily. 90 capsule 3  . nystatin (MYCOSTATIN) 100000 UNIT/ML suspension Take 5 mLs (500,000 Units total) by mouth 3 (three) times daily as needed. 240 mL 0  . ONETOUCH DELICA LANCETS 33G 07PC PATIENT USES ONE TOUCH DELICAL 30G 71GCETS. Use as directed to check blood sugar up to 6 times per day. DX: E11.09 500 each 3  . potassium chloride (KLOR-CON M10) 10 MEQ tablet Take 2 tablets (20 mEq total) by mouth daily. 180 tablet 3  . rifaximin (XIFAXAN) 550 MG TABS tablet Take 1 tablet (550 mg total) by mouth 3 (three) times daily. 42 tablet 0  . rivaroxaban (XARELTO) 20 MG TABS tablet Take 1 tablet (  20 mg total) by mouth daily with supper. 90 tablet 1  . Saccharomyces boulardii (FLORASTOR PO) Take 1 capsule by mouth daily.     . [DISCONTINUED] amitriptyline (ELAVIL) 25 MG tablet Take 1 tablet (25 mg total) by mouth at bedtime. start with 1/2 tab at night for 1 week then increase to 1 whole tab. 30 tablet 3  . [DISCONTINUED] bromocriptine (PARLODEL) 2.5 MG tablet Take 2.5 mg by mouth 2 (two) times daily.      . [DISCONTINUED] potassium chloride (K-DUR) 10 MEQ tablet Take 2 tablets (20 mEq total) by mouth daily. 90 tablet 1   No current facility-administered medications on file prior to visit.    Past Medical History  Diagnosis Date  . GERD   . Osteoarthritis   . HYPERTENSION   . IDDM (insulin dependent diabetes mellitus) ([place])   . Ulcerative colitis ([facility])     Remicade infusion Q 8 weeks  . History of thrombocytopenia 12/2011  . 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 Dental bridge present     lower  . Anxiety   . Complication of anesthesia   . OSA (obstructive sleep apnea) 09/14/2015    sleep study 09/07/15: severe obstructive sleep apnea with an AHI of 72 and SaO2 low of 75%.>refer to sleep med for eval and tx options    Past Surgical History  Procedure Laterality Date  . Abdominal hysterectomy  1980    partial   . Breast reduction surgery  1994  . Rectocele repair  1990; 09/12/2006  . Carpal tunnel release Right 1996  . Knee arthroscopy Right 01/1999; 10/2000  . Hemilaminotomy lumbar spine Bilateral 09/07/1999    L4-5  . Cholecystectomy    . Flexible sigmoidoscopy  01/17/2012    Procedure: FLEXIBLE SIGMOIDOSCOPY;  Surgeon: Rogene Houston, MD;  [facility];  Service: Endoscopy;  Laterality: N/A;  . Bilateral salpingoophorectomy  02/10/2001  . Tarsal tunnel release  2002  . Ureterolysis Right 02/10/2001  . Lysis of adhesion  02/10/2001  . Carpal tunnel release Left 03/21/2003  . 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 Esophagogastroduodenoscopy (egd) with esophageal dilation  12/02/2005  . Carpal tunnel release Right 05/04/2013    Procedure: CARPAL TUNNEL RELEASE;  Surgeon: Cammie Sickle., MD;  Location: [facility];  Service: Orthopedics;  Laterality: Right;  . Carpal tunnel release Left 09/21/2013    Procedure: LEFT CARPAL TUNNEL RELEASE;  Surgeon: Cammie Sickle., MD;  Location: [facility];  Service: Orthopedics;  Laterality: Left;  . Bacterial overgrowth test N/A 07/13/2015    Procedure: BACTERIAL OVERGROWTH TEST;  Surgeon: Rogene Houston, MD;  [facility];  Service: Endoscopy;  Laterality: N/A;  730    . Esophagogastroduodenoscopy (egd) with propofol N/A 08/04/2015    Procedure: ESOPHAGOGASTRODUODENOSCOPY (EGD) WITH PROPOFOL;  Surgeon: Rogene Houston, MD;  [facility];  Service: Endoscopy;  Laterality: N/A;  procedure 1  . Esophageal dilation N/A 08/04/2015    Procedure: ESOPHAGEAL DILATION;  Surgeon: Rogene Houston, MD;  [facility];  Service: Endoscopy;  Laterality: N/A;  Maloney 57, no mucousal disruption  . Colonoscopy with propofol N/A 08/04/2015    Procedure: COLONOSCOPY WITH PROPOFOL;  Surgeon: Rogene Houston, MD;  [facility];  Service: Endoscopy;  Laterality: N/A;  cecum time in  0820   time  out  0827    total  time 7 minutes    Social History   Social History  . Marital Status: Married    Spouse Name: N/A  . Number of Children: N/A  . Years of Education: N/A   Social History Main Topics  . Smoking status: Never Smoker   . Smokeless tobacco: Never Used  . Alcohol Use: 0.0 oz/week    0 Standard drinks or equivalent per week     Comment: very rare  . Drug Use: No  . Sexual Activity:    Partners: Male    Birth Control/ Protection: Surgical     Comment: hysterectomy   Other Topics Concern  . None   Social History Narrative   She lives with husband in [place] home.  They have one grown son and 2 grandchildren.   She is retired [employer].   Highest of level education:  Some college    Family History  Problem Relation Age of Onset  . Diabetes Mother   . Hypertension Mother   . Heart attack Father     Mid 67's  . Heart disease Father   . Lung disease Father     spot on lung; had lung surgery  . Alcohol abuse Other   . Hypertension Son   . Diabetes Son   . Neuropathy Neg Hx     Review of Systems  Constitutional: Negative for fever.  HENT: Positive for postnasal drip and voice change. Negative for congestion.        Teeth pain  Respiratory: Positive for cough. Negative for shortness of breath and wheezing.   Cardiovascular: Positive for chest pain and leg swelling (mild). Negative for palpitations.  Neurological: Negative for light-headedness and headaches.       Objective:   Filed Vitals:   10/24/15 1051  BP: 134/78  Pulse: 98  Temp: 98.6 F (37 C)  Resp: 18   Filed Weights   10/24/15 1051  Weight: 215 lb (97.523 kg)   Body mass index is 39.31 kg/(m^2).   Physical Exam Constitutional: Appears well-developed and well-nourished. No distress.  Neck: Neck supple. No tracheal deviation present. No thyromegaly present.  No carotid bruit. No cervical adenopathy.   Cardiovascular: Normal rate, regular rhythm and normal heart sounds.     No murmur heard.  No edema Pulmonary/Chest: Effort normal and breath sounds normal. No respiratory distress. No wheezes.         Assessment & Plan:   See Problem List for Assessment and Plan of chronic medical problems.  Follow-up in 2 months

## 2015-10-24 NOTE — Assessment & Plan Note (Signed)
She was not aware of her sleep study results and we reviewed them a referral placed for pulmonary to consider CPAP

## 2015-10-24 NOTE — Assessment & Plan Note (Signed)
Has seen neurology and rheumatology Has tried multiple medications that were either not effective or had side effects including amitriptyline, nortriptyline, gabapentin, Lyrica and Cymbalta Currently not tolerating Cymbalta-we will taper her off slowly. I have instructed her how to do this and she will call with any questions or concerns We can retry gabapentin to see if that helps. He could also try Effexor Discussed with her that there are not many options for treatment We'll discuss more at her next visit after she is off of the Cymbalta

## 2015-10-24 NOTE — Progress Notes (Signed)
Pre visit review using our clinic review tool, if applicable. No additional management support is needed unless otherwise documented below in the visit note. 

## 2015-10-24 NOTE — Patient Instructions (Signed)
We will wean off the cymbalta as we discussed and if you have any side effects when coming off the medication call and we will adjust how you come off the medication.  A referral for pulmonary and endocrine were ordered.  Please schedule followup in 2 months

## 2015-10-24 NOTE — Assessment & Plan Note (Signed)
She is on a lot of insulin and overall her sugars have been well controlled  Lab Results  Component Value Date   HGBA1C 6.6* 08/29/2015    Continue current medication regimen. Hold NovoLog at night time sugars are lower-she has been doing this on her own We will refer to endocrine to see if she would benefit from a medication such as victoza tha might help with weight loss or to see if there is any recommendations for adjusting her current regimen

## 2015-10-25 ENCOUNTER — Telehealth: Payer: Self-pay | Admitting: Emergency Medicine

## 2015-10-25 MED ORDER — DULOXETINE HCL 30 MG PO CPEP: 30.0000 mg | ORAL_CAPSULE | Freq: Every day | ORAL | Status: DC

## 2015-10-25 MED ORDER — FUROSEMIDE 40 MG PO TABS
40.0000 mg | ORAL_TABLET | Freq: Every day | ORAL | Status: DC
Start: 2015-10-25 — End: 2016-11-26

## 2015-10-25 MED ORDER — INSULIN DETEMIR 100 UNIT/ML FLEXPEN: 70.0000 [IU] | PEN_INJECTOR | Freq: Every day | SUBCUTANEOUS | Status: DC

## 2015-10-25 MED ORDER — MECLIZINE HCL 25 MG PO TABS: 25.0000 mg | ORAL_TABLET | Freq: Three times a day (TID) | ORAL | Status: DC | PRN

## 2015-10-25 MED ORDER — CHOLESTYRAMINE 4 G PO PACK: 4.0000 g | PACK | Freq: Two times a day (BID) | ORAL | Status: DC

## 2015-10-25 MED ORDER — POTASSIUM CHLORIDE CRYS ER 10 MEQ PO TBCR
20.0000 meq | EXTENDED_RELEASE_TABLET | Freq: Every day | ORAL | Status: DC
Start: 2015-10-25 — End: 2016-10-25

## 2015-10-25 MED ORDER — INSULIN ASPART 100 UNIT/ML FLEXPEN: 130.0000 [IU] | PEN_INJECTOR | Freq: Three times a day (TID) | SUBCUTANEOUS | Status: DC

## 2015-10-25 MED ORDER — DULOXETINE HCL 60 MG PO CPEP: 60.0000 mg | ORAL_CAPSULE | Freq: Every day | ORAL | Status: DC

## 2015-10-25 MED ORDER — RIVAROXABAN 20 MG PO TABS: 20.0000 mg | ORAL_TABLET | Freq: Every day | ORAL | Status: DC

## 2015-10-25 NOTE — Telephone Encounter (Signed)
Have sent all new RXs to new mail order.

## 2015-10-27 NOTE — Progress Notes (Signed)
HPI: 70 year old female for fu of atrial fibrillation. Stress echocardiogram performed in September of 2011 was interpreted as normal. There was mention that LV function may not have improved as much as expected and if clinical suspicion high consider alternative imaging. Lexiscan Myoview in November of 2011 showed normal perfusion. Study not gated. CardioNet showed sinus with PVCs. Had EGD recently in November and developed atrial fibrillation. Placed on amiodarone and xarelto. Echocardiogram November 2016 showed normal LV function, moderate left ventricular hypertrophy, grade 1 diastolic dysfunction and mild left atrial enlargement. TSH November 2016 normal. Since she was discharged from the hospital there is no dyspnea on exertion, orthopnea, PND, pedal edema, palpitations, syncope or chest pain.  Current Outpatient Prescriptions  Medication Sig Dispense Refill  . amiodarone (PACERONE) 200 MG tablet Take 1 tablet (200 mg total) by mouth daily. 90 tablet 3  . azelastine (ASTELIN) 0.1 % nasal spray Place 2 sprays into both nostrils 2 (two) times daily. 30 mL 1  . B-D UF III MINI PEN NEEDLES 31G X 5 MM MISC USE 6 TIMES DAILY AS DIRECTED 540 each 2  . cromolyn (OPTICROM) 4 % ophthalmic solution Place 1 drop into both eyes as needed.     . DULoxetine (CYMBALTA) 30 MG capsule Take 1 capsule (30 mg total) by mouth daily. Take with 71m for total 968md 90 capsule 1  . furosemide (LASIX) 40 MG tablet Take 1 tablet (40 mg total) by mouth daily. 90 tablet 3  . glucose blood test strip Use as instructed to test blood sugar four times per day. DX E11.09 500 each 1  . inFLIXimab (REMICADE) 100 MG injection Inject into the vein every 8 (eight) weeks.     . insulin aspart (NOVOLOG) 100 UNIT/ML FlexPen Inject 130 Units into the skin 4 (four) times daily -  before meals and at bedtime. 130 units 3xs daily, 60 units at night 120 pen 1  . Insulin Detemir (LEVEMIR) 100 UNIT/ML Pen Inject 70 Units into the skin  at bedtime. 60 pen 1  . Insulin Pen Needle (PEN NEEDLES 31GX5/16") 31G X 8 MM MISC Use pen needles with insulin pens to inject insulin as directed. DX E11.09 600 each 1  . lactase (LACTAID) 3000 UNITS tablet Take 3,000 Units by mouth as needed.     . loperamide (IMODIUM) 2 MG capsule Take 2 mg by mouth 3 (three) times daily as needed for diarrhea or loose stools.     . meclizine (ANTIVERT) 25 MG tablet Take 1 tablet (25 mg total) by mouth 3 (three) times daily as needed for dizziness or nausea. 90 tablet 0  . metoprolol succinate (TOPROL-XL) 100 MG 24 hr tablet Take 1 tablet (100 mg total) by mouth daily. (Patient taking differently: Take 100 mg by mouth 2 (two) times daily. ) 90 tablet 1  . NEXIUM 40 MG capsule Take 1 capsule (40 mg total) by mouth daily. 90 capsule 3  . ONETOUCH DELICA LANCETS 3350VISC PATIENT USES ONE TOUCH DELICAL 3069VANCETS. Use as directed to check blood sugar up to 6 times per day. DX: E11.09 500 each 3  . potassium chloride (KLOR-CON M10) 10 MEQ tablet Take 2 tablets (20 mEq total) by mouth daily. 180 tablet 3  . rivaroxaban (XARELTO) 20 MG TABS tablet Take 1 tablet (20 mg total) by mouth daily with supper. (Patient taking differently: Take 20 mg by mouth daily with supper. ) 90 tablet 1  . [DISCONTINUED] amitriptyline (ELAVIL) 25 MG tablet Take  1 tablet (25 mg total) by mouth at bedtime. start with 1/2 tab at night for 1 week then increase to 1 whole tab. 30 tablet 3  . [DISCONTINUED] bromocriptine (PARLODEL) 2.5 MG tablet Take 2.5 mg by mouth 2 (two) times daily.      . [DISCONTINUED] potassium chloride (K-DUR) 10 MEQ tablet Take 2 tablets (20 mEq total) by mouth daily. 90 tablet 1   No current facility-administered medications for this visit.    Allergies  Allergen Reactions  . Actos [Pioglitazone] Other (See Comments)    EDEMA   . Benzocaine-Menthol Swelling    SWELLING OF MOUTH  . Colesevelam Other (See Comments)    GI UPSET  . Flagyl [Metronidazole Hcl] Other  (See Comments)    DIAPHORESIS  . Metformin And Related Diarrhea  . Omeprazole Swelling    SWELLING OF TONGUE AND THROAT  . Shrimp [Shellfish Allergy] Itching    OF THROAT AND EARS  . Statins Other (See Comments)    HEART RACING  . Desipramine Hcl Itching, Nausea Only and Other (See Comments)    "swimmy" headed, ears itched   . Hydromorphone Itching  . Adhesive [Tape] Other (See Comments)    SKIN IRRITATION AND BRUISING  . Nisoldipine Itching  . Percocet [Oxycodone-Acetaminophen] Itching     Past Medical History  Diagnosis Date  . GERD   . Osteoarthritis   . HYPERTENSION   . IDDM (insulin dependent diabetes mellitus) ([place])   . Ulcerative colitis ([facility])     Remicade infusion Q 8 weeks  . History of thrombocytopenia 12/2011  . 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 Dental bridge present     lower  . Anxiety   . Complication of anesthesia   . OSA (obstructive sleep apnea) 09/14/2015    sleep study 09/07/15: severe obstructive sleep apnea with an AHI of 72 and SaO2 low of 75%.>refer to sleep med for eval and tx options    Past Surgical History  Procedure Laterality Date  . Abdominal hysterectomy  1980    partial  . Breast reduction surgery  1994  . Rectocele repair  1990; 09/12/2006  . Carpal tunnel release Right 1996  . Knee arthroscopy Right 01/1999; 10/2000  . Hemilaminotomy lumbar spine Bilateral 09/07/1999    L4-5  . Cholecystectomy    . Flexible sigmoidoscopy  01/17/2012    Procedure: FLEXIBLE SIGMOIDOSCOPY;  Surgeon: Rogene Houston, MD;  [facility];  Service: Endoscopy;  Laterality: N/A;  . Bilateral salpingoophorectomy  02/10/2001  . Tarsal tunnel release  2002  . Ureterolysis Right 02/10/2001  . Lysis of adhesion  02/10/2001  . Carpal tunnel release Left 03/21/2003  . 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 Esophagogastroduodenoscopy (egd) with esophageal dilation  12/02/2005  . Carpal tunnel release Right  05/04/2013    Procedure: CARPAL TUNNEL RELEASE;  Surgeon: Cammie Sickle., MD;  Location: [facility];  Service: Orthopedics;  Laterality: Right;  . Carpal tunnel release Left 09/21/2013    Procedure: LEFT CARPAL TUNNEL RELEASE;  Surgeon: Cammie Sickle., MD;  Location: [facility];  Service: Orthopedics;  Laterality: Left;  . Bacterial overgrowth test N/A 07/13/2015    Procedure: BACTERIAL OVERGROWTH TEST;  Surgeon: Rogene Houston, MD;  [facility];  Service: Endoscopy;  Laterality: N/A;  730    . Esophagogastroduodenoscopy (egd) with propofol N/A 08/04/2015  Procedure: ESOPHAGOGASTRODUODENOSCOPY (EGD) WITH PROPOFOL;  Surgeon: Rogene Houston, MD;  [facility];  Service: Endoscopy;  Laterality: N/A;  procedure 1  . Esophageal dilation N/A 08/04/2015    Procedure: ESOPHAGEAL DILATION;  Surgeon: Rogene Houston, MD;  [facility];  Service: Endoscopy;  Laterality: N/A;  Maloney 57, no mucousal disruption  . Colonoscopy with propofol N/A 08/04/2015    Procedure: COLONOSCOPY WITH PROPOFOL;  Surgeon: Rogene Houston, MD;  [facility];  Service: Endoscopy;  Laterality: N/A;  cecum time in  0820   time out  0827    total time 7 minutes    Social History   Social History  . Marital Status: Married    Spouse Name: N/A  . Number of Children: N/A  . Years of Education: N/A   Occupational History  . Not on file.   Social History Main Topics  . Smoking status: Never Smoker   . Smokeless tobacco: Never Used  . Alcohol Use: 0.0 oz/week    0 Standard drinks or equivalent per week     Comment: very rare  . Drug Use: No  . Sexual Activity:    Partners: Male    Birth Control/ Protection: Surgical     Comment: hysterectomy   Other Topics Concern  . Not on file   Social History Narrative   She lives with husband in two-story home.  They have one grown son and 2 grandchildren.   She is retired [employer].   Highest of level  education:  Some college    Family History  Problem Relation Age of Onset  . Diabetes Mother   . Hypertension Mother   . Heart attack Father     Mid 51's  . Heart disease Father   . Lung disease Father     spot on lung; had lung surgery  . Alcohol abuse Other   . Hypertension Son   . Diabetes Son   . Neuropathy Neg Hx     ROS: no fevers or chills, productive cough, hemoptysis, dysphasia, odynophagia, melena, hematochezia, dysuria, hematuria, rash, seizure activity, orthopnea, PND, pedal edema, claudication. Remaining systems are negative.  Physical Exam:   Blood pressure 130/70, pulse 69, height 5' 2"  (1.575 m), weight 212 lb (96.163 kg).  General:  Well developed/obese in NAD Skin warm/dry Patient not depressed No peripheral clubbing Back-normal HEENT-normal/normal eyelids Neck supple/normal carotid upstroke bilaterally; no bruits; no JVD; no thyromegaly chest - CTA/ normal expansion CV - RRR/normal S1 and S2; no murmurs, rubs or gallops;  PMI nondisplaced Abdomen -NT/ND, no HSM, no mass, + bowel sounds, no bruit 2+ femoral pulses, no bruits Ext-no edema, chords, 2+ DP Neuro-grossly nonfocal  ECG Normal sinus rhythm at a rate of 69. Low voltage. Nonspecific ST changes.

## 2015-10-31 ENCOUNTER — Encounter (HOSPITAL_COMMUNITY): Payer: Self-pay

## 2015-10-31 ENCOUNTER — Encounter (HOSPITAL_COMMUNITY)
Admission: RE | Admit: 2015-10-31 | Discharge: 2015-10-31 | Disposition: A | Discharge status: Home / Self Care | Payer: Medicare Other | Source: Ambulatory Visit | Attending: Internal Medicine | Admitting: Internal Medicine

## 2015-10-31 DIAGNOSIS — Z79899 Other long term (current) drug therapy: Secondary | ICD-10-CM | POA: Insufficient documentation

## 2015-10-31 DIAGNOSIS — K5190 Ulcerative colitis, unspecified, without complications: Secondary | ICD-10-CM | POA: Diagnosis not present

## 2015-10-31 MED ORDER — LORATADINE 10 MG PO TABS
10.0000 mg | ORAL_TABLET | Freq: Every day | ORAL | Status: DC
  Administered 2015-10-31: 10 mg via ORAL

## 2015-10-31 MED ORDER — SODIUM CHLORIDE 0.9 % IV SOLN
5.0000 mg/kg | Freq: Once | INTRAVENOUS | Status: AC
  Administered 2015-10-31: 500 mg via INTRAVENOUS
  Filled 2015-10-31: qty 50

## 2015-10-31 MED ORDER — ACETAMINOPHEN 325 MG PO TABS
650.0000 mg | ORAL_TABLET | Freq: Once | ORAL | Status: AC
  Administered 2015-10-31: 650 mg via ORAL

## 2015-10-31 MED ORDER — SODIUM CHLORIDE 0.9 % IV SOLN
INTRAVENOUS | Status: DC
  Administered 2015-10-31: 250 mL via INTRAVENOUS

## 2015-10-31 MED ORDER — LORATADINE 10 MG PO TABS
ORAL_TABLET | ORAL | Status: AC
  Filled 2015-10-31: qty 1

## 2015-10-31 MED ORDER — ACETAMINOPHEN 325 MG PO TABS
ORAL_TABLET | ORAL | Status: AC
  Filled 2015-10-31: qty 2

## 2015-11-01 ENCOUNTER — Telehealth: Payer: Self-pay | Admitting: *Deleted

## 2015-11-02 NOTE — Telephone Encounter (Signed)
Pt left msg on triage yesterday afternoon stating that her Xarelto was suppose to go to her local pharmacy not her mail order. They had took out $141.00 requesting a call back. Called pt back she inform me that she had cancel the order wioth her mail order. They inform her that she can call the company to try to get assistant with getting med. She stated that they are going to send her a coupon that way she can get medication free at her local pharmacy. She does not want her Xarelto to go to her mail service must go to her local [facility]. Inform pt will attach that to the medicine that way md assistant can see when she need to send refills...[name]

## 2015-11-03 ENCOUNTER — Ambulatory Visit (INDEPENDENT_AMBULATORY_CARE_PROVIDER_SITE_OTHER): Payer: Medicare Other | Admitting: Cardiology

## 2015-11-03 ENCOUNTER — Encounter: Payer: Self-pay | Admitting: Cardiology

## 2015-11-03 VITALS — BP 130/70 | HR 69 | Ht 62.0 in | Wt 212.0 lb

## 2015-11-03 DIAGNOSIS — I4891 Unspecified atrial fibrillation: Secondary | ICD-10-CM

## 2015-11-03 DIAGNOSIS — I10 Essential (primary) hypertension: Secondary | ICD-10-CM | POA: Diagnosis not present

## 2015-11-03 DIAGNOSIS — E785 Hyperlipidemia, unspecified: Secondary | ICD-10-CM

## 2015-11-03 LAB — CBC
HEMATOCRIT: 42.1 % (ref 36.0–46.0)
Hemoglobin: 14.2 g/dL (ref 12.0–15.0)
MCH: 31.8 pg (ref 26.0–34.0)
MCHC: 33.7 g/dL (ref 30.0–36.0)
MCV: 94.4 fL (ref 78.0–100.0)
MPV: 9.9 fL (ref 8.6–12.4)
PLATELETS: 237 10*3/uL (ref 150–400)
RBC: 4.46 MIL/uL (ref 3.87–5.11)
RDW: 13.7 % (ref 11.5–15.5)
WBC: 9.2 10*3/uL (ref 4.0–10.5)

## 2015-11-03 NOTE — Assessment & Plan Note (Signed)
Management per primary care. 

## 2015-11-03 NOTE — Assessment & Plan Note (Signed)
Patient remains in sinus rhythm today. I think her episode of atrial fibrillation may have been related to her colonoscopy. I do not think we should commit her to amiodarone at this point. We will discontinue this medication and follow her for recurrent rhythm disturbance. Continue metoprolol. CHADSvasc 3. Continue xarelto. Check hemoglobin and renal function.

## 2015-11-03 NOTE — Patient Instructions (Signed)
Medication Instructions:   STOP AMIODARONE  Labwork:  Your physician recommends that you HAVE LAB WORK TODAY   Follow-Up:  Your physician wants you to follow-up in: [place] will receive a reminder letter in the mail two months in advance. If you don't receive a letter, please call our office to schedule the follow-up appointment.   If you need a refill on your cardiac medications before your next appointment, please call your pharmacy.

## 2015-11-03 NOTE — Assessment & Plan Note (Signed)
Blood pressure controlled. Continue present medications. 

## 2015-11-04 LAB — BASIC METABOLIC PANEL
BUN: 16 mg/dL (ref 7–25)
CHLORIDE: 102 mmol/L (ref 98–110)
CO2: 28 mmol/L (ref 20–31)
CREATININE: 0.95 mg/dL (ref 0.50–0.99)
Calcium: 8.9 mg/dL (ref 8.6–10.4)
Glucose, Bld: 88 mg/dL (ref 65–99)
Potassium: 4.1 mmol/L (ref 3.5–5.3)
Sodium: 139 mmol/L (ref 135–146)

## 2015-11-05 ENCOUNTER — Other Ambulatory Visit: Payer: Self-pay | Admitting: Internal Medicine

## 2015-11-10 ENCOUNTER — Telehealth (INDEPENDENT_AMBULATORY_CARE_PROVIDER_SITE_OTHER): Payer: Self-pay | Admitting: Internal Medicine

## 2015-11-10 ENCOUNTER — Telehealth: Payer: Self-pay | Admitting: Internal Medicine

## 2015-11-10 NOTE — Telephone Encounter (Signed)
[name] left a message saying she's out of Nexium and needs a Rx for the generic form sent to her pharmacy. [facility] uses [facility] [place]. Please give her a call once the Rx is sent.   Pt's ph# [phone]  Thank you.

## 2015-11-10 NOTE — Telephone Encounter (Signed)
Please advise 

## 2015-11-10 NOTE — Telephone Encounter (Signed)
Pt called in to let you know this is her first week off of cymbalta. She was taking it for her neuropathy in her hands & feet. Her hands & feet are now burning and stinging. I seen in Dr. Quay Burow notes that she may want to try gabapentin.   She also states her insurance will no longer cover Nexium and the generic will make her tongue and neck swell up.   She's aware Dr. Quay Burow is out. Wondering if we can follow up with another provider.   [facility] [place]

## 2015-11-11 NOTE — Telephone Encounter (Signed)
She can retry the gabapentin - should start at 362m at night.  After two days she can try adding another 300 mg pill in the morning and take twice a day.   We can try a different medication for her heartburn such as pantoprazole (protonix), dexilant or prevacid (lansoprazole).  In order to get trade name nexium approved she will have needed to try many alternatives.

## 2015-11-14 ENCOUNTER — Ambulatory Visit (INDEPENDENT_AMBULATORY_CARE_PROVIDER_SITE_OTHER): Payer: Medicare Other | Admitting: Internal Medicine

## 2015-11-14 ENCOUNTER — Other Ambulatory Visit (INDEPENDENT_AMBULATORY_CARE_PROVIDER_SITE_OTHER): Payer: Medicare Other | Admitting: *Deleted

## 2015-11-14 ENCOUNTER — Other Ambulatory Visit: Payer: Self-pay | Admitting: General Practice

## 2015-11-14 ENCOUNTER — Encounter: Payer: Self-pay | Admitting: Internal Medicine

## 2015-11-14 VITALS — BP 108/66 | HR 62 | Temp 97.8°F | Resp 12 | Ht 62.0 in | Wt 210.0 lb

## 2015-11-14 DIAGNOSIS — IMO0002 Reserved for concepts with insufficient information to code with codable children: Secondary | ICD-10-CM | POA: Insufficient documentation

## 2015-11-14 DIAGNOSIS — D72820 Lymphocytosis (symptomatic): Secondary | ICD-10-CM

## 2015-11-14 DIAGNOSIS — E1165 Type 2 diabetes mellitus with hyperglycemia: Secondary | ICD-10-CM

## 2015-11-14 DIAGNOSIS — E1142 Type 2 diabetes mellitus with diabetic polyneuropathy: Secondary | ICD-10-CM | POA: Diagnosis not present

## 2015-11-14 DIAGNOSIS — IMO0001 Reserved for inherently not codable concepts without codable children: Secondary | ICD-10-CM

## 2015-11-14 LAB — POCT GLYCOSYLATED HEMOGLOBIN (HGB A1C): Hemoglobin A1C: 6.2

## 2015-11-14 MED ORDER — METFORMIN HCL ER 500 MG PO TB24: 500.0000 mg | ORAL_TABLET | Freq: Two times a day (BID) | ORAL | Status: DC

## 2015-11-14 MED ORDER — NEXIUM 40 MG PO CPDR: 40.0000 mg | DELAYED_RELEASE_CAPSULE | Freq: Every day | ORAL | Status: DC

## 2015-11-14 MED ORDER — EMPAGLIFLOZIN 25 MG PO TABS: 25.0000 mg | ORAL_TABLET | Freq: Every day | ORAL | Status: DC

## 2015-11-14 NOTE — Patient Instructions (Addendum)
Please continue: - Levemir 60 units every night  Please decrease NovoLog to: - 100 units 3x a day (inject 15 min before a meal)  Please add: - Metformin ER 500 mg 2x a day with meal - Jardiance 25 mg daily in am   Please return in 1.5 months with your sugar log.   PATIENT INSTRUCTIONS FOR TYPE 2 DIABETES:  **Please join MyChart!** - see attached instructions about how to join if you have not done so already.  DIET AND EXERCISE Diet and exercise is an important part of diabetic treatment.  We recommended aerobic exercise in the form of brisk walking (working between 40-60% of maximal aerobic capacity, similar to brisk walking) for 150 minutes per week (such as 30 minutes five days per week) along with 3 times per week performing 'resistance' training (using various gauge rubber tubes with handles) 5-10 exercises involving the major muscle groups (upper body, lower body and core) performing 10-15 repetitions (or near fatigue) each exercise. Start at half the above goal but build slowly to reach the above goals. If limited by weight, joint pain, or disability, we recommend daily walking in a swimming pool with water up to waist to reduce pressure from joints while allow for adequate exercise.    BLOOD GLUCOSES Monitoring your blood glucoses is important for continued management of your diabetes. Please check your blood glucoses 2-4 times a day: fasting, before meals and at bedtime (you can rotate these measurements - e.g. one day check before the 3 meals, the next day check before 2 of the meals and before bedtime, etc.).   HYPOGLYCEMIA (low blood sugar) Hypoglycemia is usually a reaction to not eating, exercising, or taking too much insulin/ other diabetes drugs.  Symptoms include tremors, sweating, hunger, confusion, headache, etc. Treat IMMEDIATELY with 15 grams of Carbs: . 4 glucose tablets .  cup regular juice/soda . 2 tablespoons raisins . 4 teaspoons sugar . 1 tablespoon  honey Recheck blood glucose in 15 mins and repeat above if still symptomatic/blood glucose <100.  RECOMMENDATIONS TO REDUCE YOUR RISK OF DIABETIC COMPLICATIONS: * Take your prescribed MEDICATION(S) * Follow a DIABETIC diet: Complex carbs, fiber rich foods, (monounsaturated and polyunsaturated) fats * AVOID saturated/trans fats, high fat foods, >2,300 mg salt per day. * EXERCISE at least 5 times a week for 30 minutes or preferably daily.  * DO NOT SMOKE OR DRINK more than 1 drink a day. * Check your FEET every day. Do not wear tightfitting shoes. Contact us if you develop an ulcer * See your EYE doctor once a year or more if needed * Get a FLU shot once a year * Get a PNEUMONIA vaccine once before and once after age 30 years  GOALS:  * Your Hemoglobin A1c of <7%  * fasting sugars need to be <130 * after meals sugars need to be <180 (2h after you start eating) * Your Systolic BP should be 448 or lower  * Your Diastolic BP should be 80 or lower  * Your HDL (Good Cholesterol) should be 40 or higher  * Your LDL (Bad Cholesterol) should be 100 or lower. * Your Triglycerides should be 150 or lower  * Your Urine microalbumin (kidney function) should be <30 * Your Body Mass Index should be 25 or lower    Please consider the following ways to cut down carbs and fat and increase fiber and micronutrients in your diet: - substitute whole grain for white bread or pasta - substitute brown rice  for white rice - substitute 90-calorie flat bread pieces for slices of bread when possible - substitute sweet potatoes or yams for white potatoes - substitute humus for margarine - substitute tofu for cheese when possible - substitute almond or rice milk for regular milk (would not drink soy milk daily due to concern for soy estrogen influence on breast cancer risk) - substitute dark chocolate for other sweets when possible - substitute water - can add lemon or orange slices for taste - for diet sodas  (artificial sweeteners will trick your body that you can eat sweets without getting calories and will lead you to overeating and weight gain in the long run) - do not skip breakfast or other meals (this will slow down the metabolism and will result in more weight gain over time)  - can try smoothies made from fruit and almond/rice milk in am instead of regular breakfast - can also try old-fashioned (not instant) oatmeal made with almond/rice milk in am - order the dressing on the side when eating salad at a restaurant (pour less than half of the dressing on the salad) - eat as little meat as possible - can try juicing, but should not forget that juicing will get rid of the fiber, so would alternate with eating raw veg./fruits or drinking smoothies - use as little oil as possible, even when using olive oil - can dress a salad with a mix of balsamic vinegar and lemon juice, for e.g. - use agave nectar, stevia sugar, or regular sugar rather than artificial sweateners - steam or broil/roast veggies  - snack on veggies/fruit/nuts (unsalted, preferably) when possible, rather than processed foods - reduce or eliminate aspartame in diet (it is in diet sodas, chewing gum, etc) Read the labels!  Try to read Dr. Janene Harvey book: "Program for Reversing Diabetes" for other ideas for healthy eating.

## 2015-11-14 NOTE — Progress Notes (Signed)
Patient ID: [name], female   DOB: 1946-06-20, 70 y.o.   MRN: [id number]  HPI: [name] is a 70 y.o.-year-old female, referred by her PCP, Dr. Quay Burow, for management of DM2, dx in 2010, insulin-dependent, uncontrolled, with complications (peripheral neuropathy). She saw Dr. Loanne Drilling before (up to 2 years ago).  Last hemoglobin A1c was: Lab Results  Component Value Date   HGBA1C 6.6* 08/29/2015   HGBA1C 6.5 02/21/2015   HGBA1C 6.8* 08/18/2014   Pt is on a regimen of: - Levemir 60 units at bedtime - Novolog 130 units 3x a day, before meals + 60 units at bedtime She was on Metformin >> caused her UC (diarrhea) to be worse.  Pt checks her sugars 4x a day and they are: - am: 82-226, 257 - 2h after b'fast: n/c - before lunch: 120-206 - 2h after lunch: n/c - before dinner: 56, 92-158 - 2h after dinner: n/c - bedtime: 93-248 - nighttime: 65-69 Lowest sugar was 60  - at night - frequently; she has hypoglycemia awareness at 90s.  Highest sugar was 268.  Glucometer: One Touch Ultra Mini  Pt's meals are: - Breakfast: sausage, toast, eggs - Lunch: sandwich, chips or fruit - Dinner: chicken, potatoes, salad - Snacks: fruit  - no CKD, last BUN/creatinine:  Lab Results  Component Value Date   BUN 16 11/03/2015   CREATININE 0.95 11/03/2015   - last set of lipids: Lab Results  Component Value Date   CHOL 230* 08/29/2015   HDL 42.90 08/29/2015   LDLCALC 153* 08/29/2015   LDLDIRECT 69.5 01/24/2011   TRIG 175.0* 08/29/2015   CHOLHDL 5 08/29/2015   - last eye exam was in 03/2015. No DR. Dr. [name] Fitch. - + numbness and tingling in her feet. + small fiber neuropathy and fibromyalgia.  Pt has FH of DM in mother.   She sees Dr Stanford Breed for A fib. On Xarelto.   ROS: Constitutional: + weight gain, + fatigue, no hot flushes, + poor sleep, + nocturia Eyes: no blurry vision, no xerophthalmia ENT: no sore throat, no nodules palpated in throat, + dysphagia/no odynophagia, +  hoarseness, no tinnitus, no hypoacusis Cardiovascular: no CP/SOB/no palpitations/leg swelling Respiratory: + cough/SOB Gastrointestinal: + N/no V/+ D/no C, + heartburn Musculoskeletal: + muscle aches/+ joint aches Skin: no rashes, + itching at Levemir inj site, + hair loss Neurological: no tremors/numbness/tingling/dizziness, no HA Psychiatric: no depression/anxiety  Past Medical History  Diagnosis Date  . GERD   . Osteoarthritis   . HYPERTENSION   . IDDM (insulin dependent diabetes mellitus) ([place])   . Ulcerative colitis (HCC)     Remicade infusion Q 8 weeks  . History of thrombocytopenia 12/2011  . 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 Dental bridge present     lower  . Anxiety   . Complication of anesthesia   . OSA (obstructive sleep apnea) 09/14/2015    sleep study 09/07/15: severe obstructive sleep apnea with an AHI of 72 and SaO2 low of 75%.>refer to sleep med for eval and tx options   Past Surgical History  Procedure Laterality Date  . Abdominal hysterectomy  1980    partial  . Breast reduction surgery  1994  . Rectocele repair  1990; 09/12/2006  . Carpal tunnel release Right 1996  . Knee arthroscopy Right 01/1999; 10/2000  . Hemilaminotomy lumbar spine Bilateral 09/07/1999    L4-5  . Cholecystectomy    . Flexible sigmoidoscopy  01/17/2012  Procedure: FLEXIBLE SIGMOIDOSCOPY;  Surgeon: Rogene Houston, MD;  [facility];  Service: Endoscopy;  Laterality: N/A;  . Bilateral salpingoophorectomy  02/10/2001  . Tarsal tunnel release  2002  . Ureterolysis Right 02/10/2001  . Lysis of adhesion  02/10/2001  . Carpal tunnel release Left 03/21/2003  . 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 Esophagogastroduodenoscopy (egd) with esophageal dilation  12/02/2005  . Carpal tunnel release Right 05/04/2013    Procedure: CARPAL TUNNEL RELEASE;  Surgeon: Cammie Sickle., MD;  Location: [place];  Service:  Orthopedics;  Laterality: Right;  . Carpal tunnel release Left 09/21/2013    Procedure: LEFT CARPAL TUNNEL RELEASE;  Surgeon: Cammie Sickle., MD;  Location: [place];  Service: Orthopedics;  Laterality: Left;  . Bacterial overgrowth test N/A 07/13/2015    Procedure: BACTERIAL OVERGROWTH TEST;  Surgeon: Rogene Houston, MD;  [facility]: [facility];  Service: Endoscopy;  Laterality: N/A;  730    . Esophagogastroduodenoscopy (egd) with propofol N/A 08/04/2015    Procedure: ESOPHAGOGASTRODUODENOSCOPY (EGD) WITH PROPOFOL;  Surgeon: Rogene Houston, MD;  Location: [facility];  Service: Endoscopy;  Laterality: N/A;  procedure 1  . Esophageal dilation N/A 08/04/2015    Procedure: ESOPHAGEAL DILATION;  Surgeon: Rogene Houston, MD;  Location: [facility];  Service: Endoscopy;  Laterality: N/A;  [place] 57, no mucousal disruption  . Colonoscopy with propofol N/A 08/04/2015    Procedure: COLONOSCOPY WITH PROPOFOL;  Surgeon: Rogene Houston, MD;  Location: [facility];  Service: Endoscopy;  Laterality: N/A;  cecum time in  0820   time out  0827    total time 7 minutes   Social History   Social History  . Marital Status: Married    Spouse Name: N/A  . Number of Children: 1   Occupational History  . 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 Smoking status: Never Smoker   . Smokeless tobacco: Never Used  . Alcohol Use: 0.0 oz/week    0 Standard drinks or equivalent per week     Comment: very rare  . Drug Use: No  . Sexual Activity:    Partners: Male   Social History Narrative   She lives with husband in two-story home.  They have one grown son and 2 grandchildren.   She is retired [employer].   Highest of level education:  college   Current Outpatient Prescriptions on File Prior to Visit  Medication Sig Dispense Refill  . azelastine (ASTELIN) 0.1 % nasal spray Place 2 sprays into both nostrils 2 (two) times daily. 30 mL 1  . B-D UF III MINI PEN NEEDLES 31G X 5 MM MISC  USE 6 TIMES DAILY AS DIRECTED 540 each 2  . cromolyn (OPTICROM) 4 % ophthalmic solution Place 1 drop into both eyes as needed.     . DULoxetine (CYMBALTA) 30 MG capsule Take 1 capsule (30 mg total) by mouth daily. Take with 61m for total 993md 90 capsule 1  . furosemide (LASIX) 40 MG tablet Take 1 tablet (40 mg total) by mouth daily. 90 tablet 3  . furosemide (LASIX) 40 MG tablet TAKE 1 TABLET DAILY 90 tablet 0  . glucose blood test strip Use as instructed to test blood sugar four times per day. DX E11.09 500 each 1  . inFLIXimab (REMICADE) 100 MG injection Inject into the vein every 8 (eight) weeks.     .Marland Kitchen  insulin aspart (NOVOLOG) 100 UNIT/ML FlexPen Inject 130 Units into the skin 4 (four) times daily -  before meals and at bedtime. 130 units 3xs daily, 60 units at night 120 pen 1  . Insulin Detemir (LEVEMIR) 100 UNIT/ML Pen Inject 70 Units into the skin at bedtime. 60 pen 1  . Insulin Pen Needle (PEN NEEDLES 31GX5/16") 31G X 8 MM MISC Use pen needles with insulin pens to inject insulin as directed. DX E11.09 600 each 1  . lactase (LACTAID) 3000 UNITS tablet Take 3,000 Units by mouth as needed.     . loperamide (IMODIUM) 2 MG capsule Take 2 mg by mouth 3 (three) times daily as needed for diarrhea or loose stools.     . meclizine (ANTIVERT) 25 MG tablet Take 1 tablet (25 mg total) by mouth 3 (three) times daily as needed for dizziness or nausea. 90 tablet 0  . metoprolol succinate (TOPROL-XL) 100 MG 24 hr tablet Take 1 tablet (100 mg total) by mouth daily. (Patient taking differently: Take 100 mg by mouth 2 (two) times daily. ) 90 tablet 1  . ONETOUCH DELICA LANCETS 32I MISC PATIENT USES ONE TOUCH DELICAL 71I LANCETS. Use as directed to check blood sugar up to 6 times per day. DX: E11.09 500 each 3  . potassium chloride (KLOR-CON M10) 10 MEQ tablet Take 2 tablets (20 mEq total) by mouth daily. 180 tablet 3  . rivaroxaban (XARELTO) 20 MG TABS tablet Take 1 tablet (20 mg total) by mouth daily with  supper. (Patient taking differently: Take 20 mg by mouth daily with supper. ) 90 tablet 1  . [DISCONTINUED] amitriptyline (ELAVIL) 25 MG tablet Take 1 tablet (25 mg total) by mouth at bedtime. start with 1/2 tab at night for 1 week then increase to 1 whole tab. 30 tablet 3  . [DISCONTINUED] bromocriptine (PARLODEL) 2.5 MG tablet Take 2.5 mg by mouth 2 (two) times daily.      . [DISCONTINUED] potassium chloride (K-DUR) 10 MEQ tablet Take 2 tablets (20 mEq total) by mouth daily. 90 tablet 1   No current facility-administered medications on file prior to visit.   Allergies  Allergen Reactions  . Actos [Pioglitazone] Other (See Comments)    EDEMA   . Benzocaine-Menthol Swelling    SWELLING OF MOUTH  . Colesevelam Other (See Comments)    GI UPSET  . Flagyl [Metronidazole Hcl] Other (See Comments)    DIAPHORESIS  . Metformin And Related Diarrhea  . Omeprazole Swelling    SWELLING OF TONGUE AND THROAT  . Shrimp [Shellfish Allergy] Itching    OF THROAT AND EARS  . Statins Other (See Comments)    HEART RACING  . Desipramine Hcl Itching, Nausea Only and Other (See Comments)    "swimmy" headed, ears itched   . Hydromorphone Itching  . Adhesive [Tape] Other (See Comments)    SKIN IRRITATION AND BRUISING  . Nisoldipine Itching  . Percocet [Oxycodone-Acetaminophen] Itching   Family History  Problem Relation Age of Onset  . Diabetes Mother   . Hypertension Mother   . Heart attack Father     Mid 57's  . Heart disease Father   . Lung disease Father     spot on lung; had lung surgery  . Alcohol abuse Other   . Hypertension Son   . Diabetes Son   . Neuropathy Neg Hx    PE: BP 108/66 mmHg  Pulse 62  Temp(Src) 97.8 F (36.6 C) (Oral)  Resp 12  Ht 5'  2" (1.575 m)  Wt 210 lb (95.255 kg)  BMI 38.40 kg/m2  SpO2 94% Wt Readings from Last 3 Encounters:  11/14/15 210 lb (95.255 kg)  11/03/15 212 lb (96.163 kg)  10/31/15 215 lb (97.523 kg)   Constitutional: overweight, in NAD Eyes:  PERRLA, EOMI, no exophthalmos ENT: moist mucous membranes, no thyromegaly, no cervical lymphadenopathy Cardiovascular: RRR, No MRG Respiratory: CTA B Gastrointestinal: abdomen soft, NT, ND, BS+ Musculoskeletal: no deformities, strength intact in all 4 Skin: moist, warm, no rashes Neurological: no tremor with outstretched hands, DTR normal in all 4  ASSESSMENT: 1. DM2, insulin-dependent, uncontrolled, with complications - PN  PLAN:  1. Patient with long-standing, uncontrolled diabetes, on basal-bolus insulin regimen, with a large preponderence of short acting vs long acting insulin. Subsequently, she has low CBGs at night and very variable sugars in am. We discussed tho reduce the Novolog dose to avoid low CBGs >> will stop the bedtime dose and will reduce the mealtime doses. Will also try to add back a low dose of Metformin (ER this time) and will try a SGLT2 inh - i advised her to reduce Furosemide to 1/2 tablet while she starts this). - we discussed about SEs of Jardiance which are: dizziness (advised to be careful when stands from sitting position), decreased BP - usually not < normal (BP today is not low), and fungal UTIs (advised to let me know if develops one).  - given discount card for Jardiance - she will let me know if she decisdes for a referral to nutrition. We did discuss about diet - I suggested to:  Patient Instructions  Please continue: - Levemir 60 units every night  Please decrease NovoLog to: - 100 units 3x a day (inject 15 min before a meal)  Please add: - Metformin ER 500 mg 2x a day with meal - Jardiance 25 mg daily in am   Please return in 1.5 months with your sugar log.   - Strongly advised her to start checking sugars at different times of the day - check 2 times a day, rotating checks - given sugar log and advised how to fill it and to bring it at next appt  - given foot care handout and explained the principles  - given instructions for hypoglycemia  management "15-15 rule"  - advised for yearly eye exams >> she is UTD - check HbA1c today >> 6.2% (at goal!) - Return to clinic in 1.5 mo with sugar log   - time spent with the patient: 50 min, of which >50% was spent in obtaining information about her symptoms, reviewing her previous labs, evaluations, and treatments, counseling her about her condition (please see the discussed topics above), and developing a plan to further investigate it; she had a list of questions which I addressed.

## 2015-11-15 ENCOUNTER — Telehealth (INDEPENDENT_AMBULATORY_CARE_PROVIDER_SITE_OTHER): Payer: Self-pay | Admitting: Internal Medicine

## 2015-11-15 ENCOUNTER — Other Ambulatory Visit (INDEPENDENT_AMBULATORY_CARE_PROVIDER_SITE_OTHER): Payer: Self-pay | Admitting: *Deleted

## 2015-11-15 DIAGNOSIS — D72820 Lymphocytosis (symptomatic): Secondary | ICD-10-CM

## 2015-11-15 NOTE — Telephone Encounter (Signed)
Tried calling pt, spouse stated she was not at home.

## 2015-11-15 NOTE — Telephone Encounter (Signed)
[name] left a message saying she was able to ask her insurance carrier about using Nexium and it was suggested that she use Dexalent 36m instead. She was also asked if she had a coupon for it. In order for her to take Nexium, she'll need a prior approval because it's a Tier 2 Drug. She said the phone number to call for prior auth information is [phone].  Pt's ph# [phone] Thank you.

## 2015-11-15 NOTE — Telephone Encounter (Signed)
Patient's insurance will not cover Nexium. Patient was called and ask that she review her book or call the insurance and see what PPI's they will cover. She states that she will do this and call us back.

## 2015-11-16 NOTE — Telephone Encounter (Signed)
Spoke with pt to inform. Pt stated she would like to try the nexium OTC before trying the alternatives. She stated that she had gabepentin on hand and did not need a refill at the moment.

## 2015-11-16 NOTE — Telephone Encounter (Signed)
This is for tammy

## 2015-11-17 NOTE — Telephone Encounter (Signed)
Patient was advised in a phone call prior to this message. Her Insurance will not cover Nexium. She is to try Dexilant 30 mg take 1 daily. I will try and get her samples from the rep. Patient stated in that previous call , that Dr. Laural [name] wanted her to come off the Nexium also.  Patient willl be called and advised about samples.

## 2015-11-21 ENCOUNTER — Telehealth: Payer: Self-pay | Admitting: Internal Medicine

## 2015-11-21 NOTE — Telephone Encounter (Signed)
Called pt and advised her per Dr Gherghe's message. Pt voiced understanding.  

## 2015-11-21 NOTE — Telephone Encounter (Signed)
Let's stop this and see what the sugars are doing w/o it.

## 2015-11-21 NOTE — Telephone Encounter (Signed)
Please read message below and advise.

## 2015-11-21 NOTE — Telephone Encounter (Signed)
Pt calling to report on the jardiance she has been weak legged, nauseated, swimmy headed, and trembly

## 2015-11-27 NOTE — Telephone Encounter (Signed)
We are awaiting Dexilant samples of the 30 mg. Patient was made aware of this.

## 2015-11-29 ENCOUNTER — Telehealth: Payer: Self-pay | Admitting: Cardiology

## 2015-11-29 NOTE — Telephone Encounter (Signed)
Spoke with pt, in the past 2 weeks she has had 3 episodes of unusual feeling, being hot and sweating. She reports nausea to the point of having to lay down. She reports dizziness, esp when closing her eyes or lying down, states is different from the previous vertigo she has had in the past. xarelto is the only new medication she has been taking. She feels she is having a reaction to the medication. Will forward for dr Stanford Breed review

## 2015-11-29 NOTE — Telephone Encounter (Signed)
Please call,pt thinks she is having side effects from the Xarelto. She is sweating so much,she feel nauseated from it.

## 2015-11-29 NOTE — Telephone Encounter (Signed)
Doubt related to xarelto; can schedule PAOV if needed. Kirk Ruths

## 2015-11-30 NOTE — Telephone Encounter (Signed)
Spoke with pt, Aware of dr Jacalyn Lefevre recommendations.  Follow up scheduled at pt request

## 2015-12-04 ENCOUNTER — Encounter: Payer: Self-pay | Admitting: Physician Assistant

## 2015-12-04 ENCOUNTER — Ambulatory Visit (INDEPENDENT_AMBULATORY_CARE_PROVIDER_SITE_OTHER): Payer: Medicare Other | Admitting: Physician Assistant

## 2015-12-04 VITALS — BP 130/64 | HR 65 | Ht 62.0 in | Wt 214.8 lb

## 2015-12-04 DIAGNOSIS — R42 Dizziness and giddiness: Secondary | ICD-10-CM

## 2015-12-04 NOTE — Progress Notes (Signed)
Cardiology Office Note   Date:  12/04/2015   ID:  Nastasha, Reising 04/24/1946, MRN [id number]  PCP:  Binnie Rail, MD  Cardiologist:  Dr. Stanford Breed Chief Complaint: Dizziness    History of Present Illness: [name] is a 70 y.o. female who presents for possible side effects from Xarelto.Stress echocardiogram performed in September of 2011 was interpreted as normal. There was mention that LV function may not have improved as much as expected and if clinical suspicion high consider alternative imaging. Lexiscan Myoview in November of 2011 showed normal perfusion. Study not gated. CardioNet showed sinus with PVCs. Had EGD recently in November and developed atrial fibrillation. Placed on amiodarone and xarelto. Echocardiogram November 2016 showed normal LV function, moderate left ventricular hypertrophy, grade 1 diastolic dysfunction and mild left atrial enlargement. TSH November 2016 normal.  Patient saw Dr. Stanford Breed 11/03/15 at which time she was in normal sinus rhythm. He felt like her atrial fibrillation was related to her colonoscopy and stopped her amiodarone. He continued metoprolol and Xarelto because of a chadsvasc score of 3. BMET that day was normal.  Patient comes in today complaining of dizziness at rest. When she closes her eyes she becomes dizzy. She also has dizziness when she turns her head quickly. She has also had profuse sweating, and nausea. Her Cymbalta and Jardiance and sent both been stopped in the past month and she's had some improvement. She is also had some congestion and takes a nasal spray for relief. She does have seasonal allergies. She had called them with these symptoms and Dr. Stanford Breed told her he did not think they were, from [place]. She has had no further palpitations and does not have dizziness when changing position.    Past Medical History  Diagnosis Date  . GERD   . Osteoarthritis   . HYPERTENSION   . IDDM (insulin dependent diabetes mellitus) ([place])    . Ulcerative colitis (HCC)     Remicade infusion Q 8 weeks  . History of thrombocytopenia 12/2011  . 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 Dental bridge present     lower  . Anxiety   . Complication of anesthesia   . OSA (obstructive sleep apnea) 09/14/2015    sleep study 09/07/15: severe obstructive sleep apnea with an AHI of 72 and SaO2 low of 75%.>refer to sleep med for eval and tx options    Past Surgical History  Procedure Laterality Date  . Abdominal hysterectomy  1980    partial  . Breast reduction surgery  1994  . Rectocele repair  1990; 09/12/2006  . Carpal tunnel release Right 1996  . Knee arthroscopy Right 01/1999; 10/2000  . Hemilaminotomy lumbar spine Bilateral 09/07/1999    L4-5  . Cholecystectomy    . Flexible sigmoidoscopy  01/17/2012    Procedure: FLEXIBLE SIGMOIDOSCOPY;  Surgeon: Rogene Houston, MD;  [facility];  Service: Endoscopy;  Laterality: N/A;  . Bilateral salpingoophorectomy  02/10/2001  . Tarsal tunnel release  2002  . Ureterolysis Right 02/10/2001  . Lysis of adhesion  02/10/2001  . Carpal tunnel release Left 03/21/2003  . 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 Esophagogastroduodenoscopy (egd) with esophageal dilation  12/02/2005  . Carpal tunnel release Right 05/04/2013    Procedure: CARPAL TUNNEL RELEASE;  Surgeon: Cammie Sickle., MD;  Location: [facility];  Service: Orthopedics;  Laterality: Right;  . Carpal  tunnel release Left 09/21/2013    Procedure: LEFT CARPAL TUNNEL RELEASE;  Surgeon: Cammie Sickle., MD;  Location: [facility];  Service: Orthopedics;  Laterality: Left;  . Bacterial overgrowth test N/A 07/13/2015    Procedure: BACTERIAL OVERGROWTH TEST;  Surgeon: Rogene Houston, MD;  [facility];  Service: Endoscopy;  Laterality: N/A;  730    . Esophagogastroduodenoscopy (egd) with propofol N/A 08/04/2015    Procedure:  ESOPHAGOGASTRODUODENOSCOPY (EGD) WITH PROPOFOL;  Surgeon: Rogene Houston, MD;  [facility];  Service: Endoscopy;  Laterality: N/A;  procedure 1  . Esophageal dilation N/A 08/04/2015    Procedure: ESOPHAGEAL DILATION;  Surgeon: Rogene Houston, MD;  [facility];  Service: Endoscopy;  Laterality: N/A;  Maloney 57, no mucousal disruption  . Colonoscopy with propofol N/A 08/04/2015    Procedure: COLONOSCOPY WITH PROPOFOL;  Surgeon: Rogene Houston, MD;  [facility];  Service: Endoscopy;  Laterality: N/A;  cecum time in  0820   time out  0827    total time 7 minutes     Current Outpatient Prescriptions  Medication Sig Dispense Refill  . azelastine (ASTELIN) 0.1 % nasal spray Place 2 sprays into both nostrils 2 (two) times daily. 30 mL 1  . B-D UF III MINI PEN NEEDLES 31G X 5 MM MISC USE 6 TIMES DAILY AS DIRECTED 540 each 2  . cromolyn (OPTICROM) 4 % ophthalmic solution Place 1 drop into both eyes as needed (allergies).     . furosemide (LASIX) 40 MG tablet Take 1 tablet (40 mg total) by mouth daily. 90 tablet 3  . glucose blood test strip Use as instructed to test blood sugar four times per day. DX E11.09 500 each 1  . inFLIXimab (REMICADE) 100 MG injection Inject into the vein every 8 (eight) weeks.     . insulin aspart (NOVOLOG) 100 unit/mL injection Inject 100 Units into the skin 3 (three) times daily before meals.    . Insulin Detemir (LEVEMIR) 100 UNIT/ML Pen Inject 70 Units into the skin at bedtime. 60 pen 1  . Insulin Pen Needle (PEN NEEDLES 31GX5/16") 31G X 8 MM MISC Use pen needles with insulin pens to inject insulin as directed. DX E11.09 600 each 1  . lactase (LACTAID) 3000 UNITS tablet Take 3,000 Units by mouth as needed (when eating foods containing dairy).     Marland Kitchen loperamide (IMODIUM) 2 MG capsule Take 2 mg by mouth 3 (three) times daily as needed for diarrhea or loose stools.     . meclizine (ANTIVERT) 25 MG tablet Take 1 tablet (25 mg total) by mouth 3 (three) times daily  as needed for dizziness or nausea. 90 tablet 0  . metFORMIN (GLUCOPHAGE-XR) 500 MG 24 hr tablet Take 1 tablet (500 mg total) by mouth 2 (two) times daily with a meal. 60 tablet 11  . metoprolol succinate (TOPROL-XL) 100 MG 24 hr tablet Take 100 mg by mouth 2 (two) times daily. Take with or immediately following a meal.    . NEXIUM 40 MG capsule Take 1 capsule (40 mg total) by mouth daily. 90 capsule 0  . ONETOUCH DELICA LANCETS 17E MISC PATIENT USES ONE TOUCH DELICAL 08X LANCETS. Use as directed to check blood sugar up to 6 times per day. DX: E11.09 500 each 3  . potassium chloride (KLOR-CON M10) 10 MEQ tablet Take 2 tablets (20 mEq total) by mouth daily. 180 tablet 3  . rivaroxaban (XARELTO) 20 MG TABS tablet  Take 1 tablet (20 mg total) by mouth daily with supper. (Patient taking differently: Take 20 mg by mouth daily with supper. ) 90 tablet 1  . [DISCONTINUED] amitriptyline (ELAVIL) 25 MG tablet Take 1 tablet (25 mg total) by mouth at bedtime. start with 1/2 tab at night for 1 week then increase to 1 whole tab. 30 tablet 3  . [DISCONTINUED] bromocriptine (PARLODEL) 2.5 MG tablet Take 2.5 mg by mouth 2 (two) times daily.      . [DISCONTINUED] potassium chloride (K-DUR) 10 MEQ tablet Take 2 tablets (20 mEq total) by mouth daily. 90 tablet 1   No current facility-administered medications for this visit.    Allergies:   Actos; Benzocaine-menthol; Colesevelam; Flagyl; Metformin and related; Omeprazole; Shrimp; Statins; Desipramine hcl; Hydromorphone; Adhesive; Nisoldipine; and Percocet    Social History:  The patient  reports that she has never smoked. She has never used smokeless tobacco. She reports that she drinks alcohol. She reports that she does not use illicit drugs.   Family History:  The patient's    family history includes Alcohol abuse in her other; Diabetes in her mother and son; Heart attack in her father; Heart disease in her father; Hypertension in her mother and son; Lung disease in  her father. There is no history of Neuropathy.    ROS:  Please see the history of present illness.   Otherwise, review of systems are positive for none.   All other systems are reviewed and negative.    PHYSICAL EXAM: VS:  BP 130/64 mmHg  Pulse 65  Ht 5' 2"  (1.575 m)  Wt 214 lb 12.8 oz (97.433 kg)  BMI 39.28 kg/m2 , BMI Body mass index is 39.28 kg/(m^2). GEN: Obese in no acute distress Neck: no JVD, HJR, carotid bruits, or masses Cardiac: RRR; no murmurs,gallop, rubs, thrill or heave,  Respiratory:  clear to auscultation bilaterally, normal work of breathing GI: soft, nontender, nondistended, + BS MS: no deformity or atrophy Extremities: without cyanosis, clubbing, edema, good distal pulses bilaterally.  Skin: warm and dry, no rash Neuro:  Strength and sensation are intact    EKG:  EKG is ordered today. The ekg ordered today demonstrates normal sinus rhythm at 60 bpm with poor R wave progression, no acute change   Recent Labs: 08/04/2015: Magnesium 1.9; TSH 2.031 08/05/2015: ALT 23 11/03/2015: BUN 16; Creat 0.95; Hemoglobin 14.2; Platelets 237; Potassium 4.1; Sodium 139    Lipid Panel    Component Value Date/Time   CHOL 230* 08/29/2015 1300   TRIG 175.0* 08/29/2015 1300   TRIG 196 01/26/2010 0000   HDL 42.90 08/29/2015 1300   CHOLHDL 5 08/29/2015 1300   VLDL 35.0 08/29/2015 1300   LDLCALC 153* 08/29/2015 1300   LDLDIRECT 69.5 01/24/2011 1125      Wt Readings from Last 3 Encounters:  12/04/15 214 lb 12.8 oz (97.433 kg)  11/14/15 210 lb (95.255 kg)  11/03/15 212 lb (96.163 kg)      Other studies Reviewed: Additional studies/ records that were reviewed today include and review of the records demonstrates: 2-D echo 08/04/15 Study Conclusions  - Left ventricle: The cavity size was normal. Wall thickness was   increased in a pattern of moderate LVH. Systolic function was   normal. The estimated ejection fraction was in the range of 60%   to 65%. Wall motion was  normal; there were no regional wall   motion abnormalities. Doppler parameters are consistent with   abnormal left ventricular relaxation (grade 1 diastolic  dysfunction). - Aortic valve: Moderately calcified annulus. Trileaflet; mildly   thickened leaflets. Valve area (VTI): 2.21 cm^2. Valve area   (Vmax): 2.1 cm^2. - Mitral valve: Mildly calcified annulus. Mildly thickened leaflets   . - Left atrium: The atrium was mildly dilated. - Technically adequate study.     ASSESSMENT AND PLAN:  Paroxysmal atrial fibrillation [facility]) Patient has a normal sinus rhythm. Amiodarone was stopped earlier this month. Continue metoprolol and Xarelto. Follow-up Dr. Stanford Breed in 2-3 months  Vertigo Patient continues to have symptoms of vertigo. She's had them in the past. She takes meclizine with some relief. We'll refer to PT for vestibular rehabilitation. Patient is not orthostatic in the office.  Essential hypertension Blood pressure stable    Signed, Ermalinda Barrios, PA-C  12/04/2015 2:31 PM    [facility] [address], [place], [place]  [address] Phone: [phone]; Fax: [phone]

## 2015-12-04 NOTE — Assessment & Plan Note (Signed)
Blood pressure stable ? ?

## 2015-12-04 NOTE — Patient Instructions (Signed)
Medication Instructions:   Your physician recommends that you continue on your current medications as directed. Please refer to the Current Medication list given to you today.  If you need a refill on your cardiac medications before your next appointment, please call your pharmacy.  Labwork:  NONE ORDER TODAY    Testing/Procedures:  NONE ORDER TODAY    Follow-Up:  YOU HAVE BEEN REFFERED TO PHYSICAL THERAPY WITH CHRISTINA WEAVER..   Any Other Special Instructions Will Be Listed Below (If Applicable).

## 2015-12-04 NOTE — Assessment & Plan Note (Signed)
Patient has a normal sinus rhythm. Amiodarone was stopped earlier this month. Continue metoprolol and Xarelto. Follow-up Dr. Stanford Breed in 2-3 months

## 2015-12-04 NOTE — Assessment & Plan Note (Addendum)
Patient continues to have symptoms of vertigo. She's had them in the past. She takes meclizine with some relief. We'll refer to PT for vestibular rehabilitation. Patient is not orthostatic in the office.

## 2015-12-07 ENCOUNTER — Other Ambulatory Visit: Payer: Self-pay | Admitting: Emergency Medicine

## 2015-12-07 MED ORDER — MECLIZINE HCL 25 MG PO TABS: 25.0000 mg | ORAL_TABLET | Freq: Three times a day (TID) | ORAL | Status: DC | PRN

## 2015-12-08 ENCOUNTER — Other Ambulatory Visit: Payer: Self-pay | Admitting: Emergency Medicine

## 2015-12-08 MED ORDER — METOPROLOL SUCCINATE ER 100 MG PO TB24: 100.0000 mg | ORAL_TABLET | Freq: Two times a day (BID) | ORAL | Status: DC

## 2015-12-22 ENCOUNTER — Telehealth: Payer: Self-pay | Admitting: Internal Medicine

## 2015-12-22 NOTE — Telephone Encounter (Signed)
Pt called in with a cold and I was unable to get her in today. She has a terrible cough which is making her feel worse. She is wondering if you could just call her in something for the cough Pharmacy is [facility] in [place]. Please advise

## 2015-12-22 NOTE — Telephone Encounter (Signed)
Spoke with pt to offer a OV tomorrow at Saturday clinic. She has an appt on Thursday and wants to wait until then. Please advise on cough meds?

## 2015-12-22 NOTE — Telephone Encounter (Signed)
LVM for pt with MD response. MD is okay with sending in Tessalon pearls or cough med, but cough med will have to be picked up from office.

## 2015-12-25 ENCOUNTER — Telehealth: Payer: Self-pay | Admitting: Internal Medicine

## 2015-12-25 NOTE — Telephone Encounter (Signed)
Called pt and advised her per Dr Arman Filter message. Pt stated that she cannot increase to 1000 mg twice daily; she said she will never get out of the bathroom if she increases to that much. Please advise of other options.

## 2015-12-25 NOTE — Telephone Encounter (Signed)
Let's increase metformin to 1000 mg twice a day and let us know about the sugars by Wednesday.

## 2015-12-25 NOTE — Telephone Encounter (Signed)
OK, let's increase Lantus to 70 units and may need 75 if sugars in am not improving in 2-3 days.

## 2015-12-25 NOTE — Telephone Encounter (Signed)
Returned pt's call. Pt advised me that she is sick and her blood sugars have been up and down. She is unable to get in with her PCP.  3/27  3/26   3/25   3/24 309 AM 246 Lunch  200 AM  193 AM   206` Bedtime 100 Bedtime 144 Bedtime   Pt stated she has an appt on Wed with Dr Cruzita Lederer. Please advise.

## 2015-12-25 NOTE — Telephone Encounter (Signed)
Pt called in saying she has been sick all last week with some kind of cold/sinus problem and wanted to let you know her sugars have been all over the place since then.  She does have an appt for Wednesday.

## 2015-12-25 NOTE — Telephone Encounter (Signed)
Called pt and advised her per Dr Gherghe's message. Pt voiced understanding.  

## 2015-12-26 ENCOUNTER — Encounter (HOSPITAL_COMMUNITY)
Admission: RE | Admit: 2015-12-26 | Payer: Medicare Other | Source: Ambulatory Visit | Attending: Internal Medicine | Admitting: Internal Medicine

## 2015-12-27 ENCOUNTER — Ambulatory Visit: Payer: Medicare Other | Admitting: Internal Medicine

## 2016-01-02 ENCOUNTER — Ambulatory Visit (INDEPENDENT_AMBULATORY_CARE_PROVIDER_SITE_OTHER): Payer: Medicare Other | Admitting: Internal Medicine

## 2016-01-02 ENCOUNTER — Encounter: Payer: Self-pay | Admitting: Internal Medicine

## 2016-01-02 VITALS — BP 122/70 | HR 82 | Temp 98.2°F | Resp 16 | Wt 214.0 lb

## 2016-01-02 DIAGNOSIS — J209 Acute bronchitis, unspecified: Secondary | ICD-10-CM | POA: Diagnosis not present

## 2016-01-02 MED ORDER — AZITHROMYCIN 250 MG PO TABS: ORAL_TABLET | ORAL | Status: DC

## 2016-01-02 MED ORDER — HYDROCODONE-HOMATROPINE 5-1.5 MG/5ML PO SYRP: 5.0000 mL | ORAL_SOLUTION | Freq: Three times a day (TID) | ORAL | Status: DC | PRN

## 2016-01-02 NOTE — Progress Notes (Signed)
Pre visit review using our clinic review tool, if applicable. No additional management support is needed unless otherwise documented below in the visit note. 

## 2016-01-02 NOTE — Patient Instructions (Signed)
An antibiotic was sent to your pharmacy.  A cough syrup was also prescribed - it does contain hydrocodone and may cause some drowsiness.  Call if your symptoms do not improve in the next 2-3 days.

## 2016-01-02 NOTE — Progress Notes (Signed)
Subjective:    Patient ID: [name], female    DOB: 1946-06-05, 70 y.o.   MRN: [id number]  HPI She is here for an acute visit for cold symptoms.     Two weeks ago her symptoms started.  We offered her a Saturday appointment, but did not want to come down on a Saturday.  She states subjective  fever, chills, coughing, sob, wheeze, right ear pain, sore throat at times, pnd and a bloody nose at times.  She has been taking robitussin Dm cough and congestion and tylenol without improvement.    Her symptoms are getting worse.     Medications and allergies reviewed with patient and updated if appropriate.  Patient Active Problem List   Diagnosis Date Noted  . Vertigo 12/04/2015  . Uncontrolled type 2 diabetes mellitus with peripheral neuropathy ([place]) 11/14/2015  . OSA (obstructive sleep apnea), severe 09/14/2015  . B12 deficiency 08/17/2015  . Vitamin B6 deficiency 08/17/2015  . Paroxysmal atrial fibrillation ([place]) 08/17/2015  . Chronic anticoagulation 08/17/2015  . Atrial fibrillation with RVR ([place]) 08/04/2015  . Dysphagia   . Gastroesophageal reflux disease without esophagitis   . Lymphocytosis 07/04/2015  . Fatty liver 06/09/2014  . Morbidly obese ([place]) 05/18/2014  . Allergic sinusitis 12/09/2013  . Myalgia 04/28/2012  . Polyarthralgia 04/28/2012  . Elevated LFTs 01/16/2012  . Insomnia 01/12/2012  . Peripheral neuropathy ([place]) 12/05/2011  . Small fiber neuropathy ([place]) 05/30/2011  . Anxiety 05/30/2011  . Dyslipidemia 01/24/2011  . PALPITATIONS, RECURRENT 08/08/2010  . DM (diabetes mellitus), type 2, uncontrolled ([place]) 05/29/2010  . Essential hypertension 04/12/2010  . GERD 04/12/2010  . Ulcerative colitis ([place]) 04/12/2010    Current Outpatient Prescriptions on File Prior to Visit  Medication Sig Dispense Refill  . azelastine (ASTELIN) 0.1 % nasal spray Place 2 sprays into both nostrils 2 (two) times daily. 30 mL 1  . B-D UF III MINI PEN NEEDLES 31G X 5 MM MISC USE  6 TIMES DAILY AS DIRECTED 540 each 2  . cromolyn (OPTICROM) 4 % ophthalmic solution Place 1 drop into both eyes as needed (allergies).     . furosemide (LASIX) 40 MG tablet Take 1 tablet (40 mg total) by mouth daily. 90 tablet 3  . glucose blood test strip Use as instructed to test blood sugar four times per day. DX E11.09 500 each 1  . inFLIXimab (REMICADE) 100 MG injection Inject into the vein every 8 (eight) weeks.     . insulin aspart (NOVOLOG) 100 unit/mL injection Inject 100 Units into the skin 3 (three) times daily before meals.    . Insulin Detemir (LEVEMIR) 100 UNIT/ML Pen Inject 70 Units into the skin at bedtime. 60 pen 1  . Insulin Pen Needle (PEN NEEDLES 31GX5/16") 31G X 8 MM MISC Use pen needles with insulin pens to inject insulin as directed. DX E11.09 600 each 1  . lactase (LACTAID) 3000 UNITS tablet Take 3,000 Units by mouth as needed (when eating foods containing dairy).     Marland Kitchen loperamide (IMODIUM) 2 MG capsule Take 2 mg by mouth 3 (three) times daily as needed for diarrhea or loose stools.     . meclizine (ANTIVERT) 25 MG tablet Take 1 tablet (25 mg total) by mouth 3 (three) times daily as needed for dizziness or nausea. 90 tablet 0  . metFORMIN (GLUCOPHAGE-XR) 500 MG 24 hr tablet Take 1 tablet (500 mg total) by mouth 2 (two) times daily with a meal. 60 tablet 11  .  metoprolol succinate (TOPROL-XL) 100 MG 24 hr tablet Take 1 tablet (100 mg total) by mouth 2 (two) times daily. Take with or immediately following a meal. 90 tablet 1  . NEXIUM 40 MG capsule Take 1 capsule (40 mg total) by mouth daily. 90 capsule 0  . ONETOUCH DELICA LANCETS 63O MISC PATIENT USES ONE TOUCH DELICAL 75I LANCETS. Use as directed to check blood sugar up to 6 times per day. DX: E11.09 500 each 3  . potassium chloride (KLOR-CON M10) 10 MEQ tablet Take 2 tablets (20 mEq total) by mouth daily. 180 tablet 3  . rivaroxaban (XARELTO) 20 MG TABS tablet Take 1 tablet (20 mg total) by mouth daily with supper. (Patient  taking differently: Take 20 mg by mouth daily with supper. ) 90 tablet 1  . [DISCONTINUED] amitriptyline (ELAVIL) 25 MG tablet Take 1 tablet (25 mg total) by mouth at bedtime. start with 1/2 tab at night for 1 week then increase to 1 whole tab. 30 tablet 3  . [DISCONTINUED] bromocriptine (PARLODEL) 2.5 MG tablet Take 2.5 mg by mouth 2 (two) times daily.      . [DISCONTINUED] potassium chloride (K-DUR) 10 MEQ tablet Take 2 tablets (20 mEq total) by mouth daily. 90 tablet 1   No current facility-administered medications on file prior to visit.    Past Medical History  Diagnosis Date  . GERD   . Osteoarthritis   . HYPERTENSION   . IDDM (insulin dependent diabetes mellitus) ([place])   . Ulcerative colitis (HCC)     Remicade infusion Q 8 weeks  . History of thrombocytopenia 12/2011  . 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 Dental bridge present     lower  . Anxiety   . Complication of anesthesia   . OSA (obstructive sleep apnea) 09/14/2015    sleep study 09/07/15: severe obstructive sleep apnea with an AHI of 72 and SaO2 low of 75%.>refer to sleep med for eval and tx options    Past Surgical History  Procedure Laterality Date  . Abdominal hysterectomy  1980    partial  . Breast reduction surgery  1994  . Rectocele repair  1990; 09/12/2006  . Carpal tunnel release Right 1996  . Knee arthroscopy Right 01/1999; 10/2000  . Hemilaminotomy lumbar spine Bilateral 09/07/1999    L4-5  . Cholecystectomy    . Flexible sigmoidoscopy  01/17/2012    Procedure: FLEXIBLE SIGMOIDOSCOPY;  Surgeon: Rogene Houston, MD;  [facility];  Service: Endoscopy;  Laterality: N/A;  . Bilateral salpingoophorectomy  02/10/2001  . Tarsal tunnel release  2002  . Ureterolysis Right 02/10/2001  . Lysis of adhesion  02/10/2001  . Carpal tunnel release Left 03/21/2003  . 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 Esophagogastroduodenoscopy (egd) with esophageal dilation   12/02/2005  . Carpal tunnel release Right 05/04/2013    Procedure: CARPAL TUNNEL RELEASE;  Surgeon: Cammie Sickle., MD;  Location: [facility];  Service: Orthopedics;  Laterality: Right;  . Carpal tunnel release Left 09/21/2013    Procedure: LEFT CARPAL TUNNEL RELEASE;  Surgeon: Cammie Sickle., MD;  Location: [place];  Service: Orthopedics;  Laterality: Left;  . Bacterial overgrowth test N/A 07/13/2015    Procedure: BACTERIAL OVERGROWTH TEST;  Surgeon: Rogene Houston, MD;  [facility];  Service: Endoscopy;  Laterality: N/A;  730    . Esophagogastroduodenoscopy (egd) with propofol  N/A 08/04/2015    Procedure: ESOPHAGOGASTRODUODENOSCOPY (EGD) WITH PROPOFOL;  Surgeon: Rogene Houston, MD;  Location[facility];  Service: Endoscopy;  Laterality: N/A;  procedure 1  . Esophageal dilation N/A 08/04/2015    Procedure: ESOPHAGEAL DILATION;  Surgeon: Rogene Houston, MD;  [facility];  Service: Endoscopy;  Laterality: N/A;  Maloney 57, no mucousal disruption  . Colonoscopy with propofol N/A 08/04/2015    Procedure: COLONOSCOPY WITH PROPOFOL;  Surgeon: Rogene Houston, MD;  [facility];  Service: Endoscopy;  Laterality: N/A;  cecum time in  0820   time out  0827    total time 7 minutes    Social History   Social History  . Marital Status: Married    Spouse Name: N/A  . Number of Children: N/A  . Years of Education: N/A   Social History Main Topics  . Smoking status: Never Smoker   . Smokeless tobacco: Never Used  . Alcohol Use: 0.0 oz/week    0 Standard drinks or equivalent per week     Comment: very rare  . Drug Use: No  . Sexual Activity:    Partners: Male    Birth Control/ Protection: Surgical     Comment: hysterectomy   Other Topics Concern  . None   Social History Narrative   She lives with husband in [place] home.  They have one grown son and 2 grandchildren.   She is retired [employer].   Highest of level  education:  Some college    Family History  Problem Relation Age of Onset  . Diabetes Mother   . Hypertension Mother   . Heart attack Father     Mid 16's  . Heart disease Father   . Lung disease Father     spot on lung; had lung surgery  . Alcohol abuse Other   . Hypertension Son   . Diabetes Son   . Neuropathy Neg Hx     Review of Systems  Constitutional: Positive for fever (subjective) and chills.  HENT: Positive for ear pain (and teeth pain), postnasal drip and sore throat. Negative for congestion and sinus pressure.   Respiratory: Positive for cough (dry ), shortness of breath and wheezing.   Cardiovascular: Negative for chest pain, palpitations and leg swelling.  Gastrointestinal: Negative for nausea, abdominal pain and diarrhea.  Musculoskeletal: Positive for myalgias.  Neurological: Positive for dizziness and headaches (from cough). Negative for light-headedness.       Objective:   Filed Vitals:   01/02/16 1417  BP: 122/70  Pulse: 82  Temp: 98.2 F (36.8 C)  Resp: 16   Filed Weights   01/02/16 1417  Weight: 214 lb (97.07 kg)   Body mass index is 39.13 kg/(m^2).   Physical Exam GENERAL APPEARANCE: Appears stated age, well appearing, NAD EYES: conjunctiva clear, no icterus HEENT: bilateral tympanic membranes and ear canals normal, oropharynx with mild erythema, no thyromegaly, trachea midline, no cervical or supraclavicular lymphadenopathy LUNGS: Frequent dry cough, Clear to auscultation without wheeze or crackles, unlabored breathing, good air entry bilaterally HEART: Normal S1,S2 without murmurs EXTREMITIES: Without clubbing, cyanosis, or edema        Assessment & Plan:   Acute bronchitis Two weeks of symptoms - not improving, getting worse Will start a zpak hydromet cough syrup Rest,fluids Continue tylenol Call if no improvement

## 2016-01-05 ENCOUNTER — Telehealth: Payer: Self-pay | Admitting: Internal Medicine

## 2016-01-05 MED ORDER — PROMETHAZINE-DM 6.25-15 MG/5ML PO SYRP: 5.0000 mL | ORAL_SOLUTION | Freq: Four times a day (QID) | ORAL | Status: DC | PRN

## 2016-01-05 NOTE — Telephone Encounter (Signed)
Sent promethazine - dextromethorphan to pharmacy.

## 2016-01-05 NOTE — Telephone Encounter (Signed)
Pt called state that cough med that Dr. Quay Burow gave her no pharmacy in town has this. Please help, she really need something ASAP

## 2016-01-05 NOTE — Telephone Encounter (Signed)
Left msg on triage stating the Hycodan rx that was prescribe 01/02/16 is on manufacturer back order with no release date. Wanting to know if can change to promethazine w/codiene.Marland KitchenJohny Chess

## 2016-01-16 ENCOUNTER — Ambulatory Visit (INDEPENDENT_AMBULATORY_CARE_PROVIDER_SITE_OTHER): Payer: Medicare Other | Admitting: Internal Medicine

## 2016-01-16 ENCOUNTER — Encounter (INDEPENDENT_AMBULATORY_CARE_PROVIDER_SITE_OTHER): Payer: Self-pay | Admitting: Internal Medicine

## 2016-01-16 VITALS — BP 140/70 | HR 82 | Temp 98.4°F | Resp 18 | Ht 61.0 in | Wt 216.5 lb

## 2016-01-16 DIAGNOSIS — K5190 Ulcerative colitis, unspecified, without complications: Secondary | ICD-10-CM | POA: Diagnosis not present

## 2016-01-16 DIAGNOSIS — K219 Gastro-esophageal reflux disease without esophagitis: Secondary | ICD-10-CM | POA: Diagnosis not present

## 2016-01-16 DIAGNOSIS — K589 Irritable bowel syndrome without diarrhea: Secondary | ICD-10-CM

## 2016-01-16 NOTE — Patient Instructions (Signed)
Please call if swallowing difficulty gets worse. Infliximab infusion to be arranged as soon as possible.

## 2016-01-16 NOTE — Progress Notes (Signed)
Presenting complaint;  Follow-up for UC IBS and GERD.  Subjective:  Patient is 70 year old Caucasian female who is here for scheduled visit. She was last seen in the office on 07/11/2015. She underwent EGD with ED and colonoscopy. EGD revealed small sliding hiatal hernia but no evidence of erosive esophagitis ring or stricture formation. She had few small hyperplastic appearing polyps at proximal stomach and these are left alone. Esophagus was dilated by passing 56 [place] dilator. Colonoscopy reveals focal peri-appendiceal edema but no evidence of erosions or ulcers. Colitis was felt to be in remission. Patient developed respiratory difficulty with propofol thought to be due to upper airway obstruction with loss of muscle tone. She did not require intubation. She developed atrial fibrillation and was briefly hospitalized and she converted with amiodarone. She was discharged and Xarelto. Patient states she had sleep study on 09/11/2015 but has not heard about the results. She has appointment to see Dr. Halford Chessman next month. Patient feels that she is doing well from GI standpoint. She generally has 1-2 formed stools daily. She has diarrhea every now and then. She takes Imodium usually every other day. She does not take Imodium on days when she is going to be on. She denies melena or rectal bleeding. She complains of soreness across upper abdomen. She feels that lower abdominal wall deformity is secondary to remote surgery and she would benefit from abdominal plasty. Patient advised to talk with PCP. She feels heartburn is well controlled with therapy. However she strangles on liquids often. She says swallowing difficulty has improved since the esophagus was dilated but she still is careful with bread and avoids meats. She has gained 8 pounds since her last visit. She states her appetite is very good and she is not able to control herself. She feels her increased appetite may be because of hypoglycemia. She  says insulin dose has been reduced. She has not received infliximab infusion as planned because she reports she had an infection and infusion was postponed. Last dose was over 10 weeks ago. Her schedule is for infusion every 8 weeks.    Current Medications: Outpatient Encounter Prescriptions as of 01/16/2016  Medication Sig  . azelastine (ASTELIN) 0.1 % nasal spray Place 2 sprays into both nostrils 2 (two) times daily.  . B-D UF III MINI PEN NEEDLES 31G X 5 MM MISC USE 6 TIMES DAILY AS DIRECTED  . cromolyn (OPTICROM) 4 % ophthalmic solution Place 1 drop into both eyes as needed (allergies).   . Esomeprazole Magnesium (NEXIUM 24HR PO) Take 20 mg by mouth daily.  . furosemide (LASIX) 40 MG tablet Take 1 tablet (40 mg total) by mouth daily.  Marland Kitchen glucose blood test strip Use as instructed to test blood sugar four times per day. DX E11.09  . inFLIXimab (REMICADE) 100 MG injection Inject into the vein every 8 (eight) weeks.   . insulin aspart (NOVOLOG) 100 unit/mL injection Inject 100 Units into the skin 3 (three) times daily before meals.  . Insulin Detemir (LEVEMIR) 100 UNIT/ML Pen Inject 70 Units into the skin at bedtime.  . Insulin Pen Needle (PEN NEEDLES 31GX5/16") 31G X 8 MM MISC Use pen needles with insulin pens to inject insulin as directed. DX E11.09  . lactase (LACTAID) 3000 UNITS tablet Take 3,000 Units by mouth as needed (when eating foods containing dairy).   Marland Kitchen loperamide (IMODIUM) 2 MG capsule Take 2 mg by mouth 3 (three) times daily as needed for diarrhea or loose stools.   . meclizine (  ANTIVERT) 25 MG tablet Take 1 tablet (25 mg total) by mouth 3 (three) times daily as needed for dizziness or nausea.  . metoprolol succinate (TOPROL-XL) 100 MG 24 hr tablet Take 1 tablet (100 mg total) by mouth 2 (two) times daily. Take with or immediately following a meal.  . ONETOUCH DELICA LANCETS 93X MISC PATIENT USES ONE TOUCH DELICAL 52Z LANCETS. Use as directed to check blood sugar up to 6 times per  day. DX: E11.09  . potassium chloride (KLOR-CON M10) 10 MEQ tablet Take 2 tablets (20 mEq total) by mouth daily.  . rivaroxaban (XARELTO) 20 MG TABS tablet Take 1 tablet (20 mg total) by mouth daily with supper. (Patient taking differently: Take 20 mg by mouth daily with supper. )  . HYDROcodone-homatropine (HYCODAN) 5-1.5 MG/5ML syrup Take 5 mLs by mouth every 8 (eight) hours as needed for cough. (Patient not taking: Reported on 01/16/2016)  . metFORMIN (GLUCOPHAGE-XR) 500 MG 24 hr tablet Take 1 tablet (500 mg total) by mouth 2 (two) times daily with a meal. (Patient not taking: Reported on 01/16/2016)  . promethazine-dextromethorphan (PROMETHAZINE-DM) 6.25-15 MG/5ML syrup Take 5 mLs by mouth 4 (four) times daily as needed for cough. (Patient not taking: Reported on 01/16/2016)  . [DISCONTINUED] azithromycin (ZITHROMAX) 250 MG tablet Take two tabs the first day and then one tab daily for four days (Patient not taking: Reported on 01/16/2016)  . [DISCONTINUED] NEXIUM 40 MG capsule Take 1 capsule (40 mg total) by mouth daily. (Patient not taking: Reported on 01/16/2016)   No facility-administered encounter medications on file as of 01/16/2016.     Objective: Blood pressure 140/70, pulse 82, temperature 98.4 F (36.9 C), temperature source Oral, resp. rate 18, height 5' 1"  (1.549 m), weight 216 lb 8 oz (98.204 kg). Patient is alert and in no acute distress. Conjunctiva is pink. Sclera is nonicteric Oropharyngeal mucosa is normal. No neck masses or thyromegaly noted. Cardiac exam with regular rhythm normal S1 and S2. No murmur or gallop noted. Lungs are clear to auscultation. Abdomen is full and soft with mild tenderness across lower abdomen. No organomegaly or masses. No LE edema or clubbing noted.  Labs/studies Results: Lab data from 11/03/2015  WBC 9.2, H&H 14.2 and 42.1 and platelet count 237K    Assessment:  #1. Ulcerative colitis. Patient remains in remission. She is on infliximab for  maintenance and last dose was over 10 weeks ago. She needs to get her infusion ASAP before she gets into trouble. #2. GERD. She is doing well with low dose Nexium and dietary measures. #3. Dysphagia. She has pharyngeal and esophageal symptomatology. Esophageal dysphagia has improved significantly following esophageal dilation in November 2016. Will monitor the symptoms for now. #4. Irritable bowel syndrome. Diarrhea controlled with when necessary loperamide OTC.   Plan:  [facility] at [facility] to schedule infliximab infusion ASAP. Patient will call if dysphagia worsens in which case we will proceed with esophageal manometry and impedance study. Office visit in 6 months.

## 2016-01-17 ENCOUNTER — Encounter (INDEPENDENT_AMBULATORY_CARE_PROVIDER_SITE_OTHER): Payer: Self-pay | Admitting: *Deleted

## 2016-01-17 ENCOUNTER — Telehealth (INDEPENDENT_AMBULATORY_CARE_PROVIDER_SITE_OTHER): Payer: Self-pay | Admitting: *Deleted

## 2016-01-17 NOTE — Telephone Encounter (Signed)
Patient was called and made aware that she could have Remicade Infusion on Friday - April 21 st @ 10 am. This was discussed and arranged with Hoyle Sauer in Short Stay @ [facility].

## 2016-01-19 ENCOUNTER — Encounter (HOSPITAL_COMMUNITY)
Admission: RE | Admit: 2016-01-19 | Discharge: 2016-01-19 | Disposition: A | Discharge status: Home / Self Care | Payer: Medicare Other | Source: Ambulatory Visit | Attending: Internal Medicine | Admitting: Internal Medicine

## 2016-01-19 DIAGNOSIS — K5190 Ulcerative colitis, unspecified, without complications: Secondary | ICD-10-CM | POA: Diagnosis not present

## 2016-01-19 MED ORDER — LORATADINE 10 MG PO TABS
ORAL_TABLET | ORAL | Status: AC
  Filled 2016-01-19: qty 1

## 2016-01-19 MED ORDER — SODIUM CHLORIDE 0.9 % IV SOLN
INTRAVENOUS | Status: DC
  Administered 2016-01-19: 10:00:00 via INTRAVENOUS

## 2016-01-19 MED ORDER — SODIUM CHLORIDE 0.9 % IV SOLN
5.0000 mg/kg | INTRAVENOUS | Status: DC
  Administered 2016-01-19: 500 mg via INTRAVENOUS
  Filled 2016-01-19: qty 50

## 2016-01-19 MED ORDER — ACETAMINOPHEN 325 MG PO TABS
ORAL_TABLET | ORAL | Status: AC
  Filled 2016-01-19: qty 2

## 2016-01-19 MED ORDER — LORATADINE 10 MG PO TABS
10.0000 mg | ORAL_TABLET | Freq: Every day | ORAL | Status: DC
  Administered 2016-01-19: 10 mg via ORAL

## 2016-01-19 MED ORDER — ACETAMINOPHEN 325 MG PO TABS
650.0000 mg | ORAL_TABLET | Freq: Once | ORAL | Status: AC
  Administered 2016-01-19: 650 mg via ORAL

## 2016-01-30 ENCOUNTER — Telehealth: Payer: Self-pay | Admitting: Cardiology

## 2016-01-30 NOTE — Telephone Encounter (Signed)
Spoke with pt, she is c/o SOB at times that started in the last week. She is also c/o not resting well and feeling tired. She denies edema but reports having to prop up to be able to breath at night. Her SOB occurs usually when going up stairs. She is seeing dr Halford Chessman tomorrow to discuss her sleep study. Advised pt do not think the SOB was related to the xarelto. Pt reported it is time for her to refill the medication and dr Stanford Breed had mentioned stopping the xarelto at her next appt. She is wanting to know if she needs to cont to take the xarelto or can she stop it. Will forward for dr Stanford Breed review

## 2016-01-30 NOTE — Telephone Encounter (Signed)
continue xarelto [employer]

## 2016-01-30 NOTE — Telephone Encounter (Signed)
New message      Pt c/o medication issue:  1. Name of Medication:  xarelto 2. How are you currently taking this medication (dosage and times per day)?  20 daily 3. Are you having a reaction (difficulty breathing--STAT)? Not at the moment 4. What is your medication issue?  Pt states within the last week, at times she cannot get a deep breath.  She thinks it is from this medication.  She is due to have it refilled but want to talk to the nurse before refilling it.  Please advise

## 2016-01-31 ENCOUNTER — Ambulatory Visit (INDEPENDENT_AMBULATORY_CARE_PROVIDER_SITE_OTHER): Payer: Medicare Other | Admitting: Pulmonary Disease

## 2016-01-31 ENCOUNTER — Encounter: Payer: Self-pay | Admitting: Pulmonary Disease

## 2016-01-31 ENCOUNTER — Telehealth: Payer: Self-pay | Admitting: Pulmonary Disease

## 2016-01-31 VITALS — BP 130/78 | HR 60 | Ht 62.0 in | Wt 220.0 lb

## 2016-01-31 DIAGNOSIS — Z6841 Body Mass Index (BMI) 40.0 and over, adult: Secondary | ICD-10-CM

## 2016-01-31 DIAGNOSIS — G4733 Obstructive sleep apnea (adult) (pediatric): Secondary | ICD-10-CM | POA: Diagnosis not present

## 2016-01-31 NOTE — Patient Instructions (Signed)
Will arrange for CPAP set up  Follow up in 2 months after CPAP set up

## 2016-01-31 NOTE — Telephone Encounter (Signed)
Spoke with pt, Aware of dr crenshaw's recommendations.  °

## 2016-01-31 NOTE — Progress Notes (Signed)
Past Medical History She  has a past medical history of GERD; Osteoarthritis; HYPERTENSION; IDDM (insulin dependent diabetes mellitus) ([place]); Ulcerative colitis ([place]); History of thrombocytopenia (12/2011); Carpal tunnel syndrome of right wrist (03/2013); Dental bridge present; Anxiety; Complication of anesthesia; and OSA (obstructive sleep apnea) (09/14/2015).  Past Surgical History She  has past surgical history that includes Abdominal hysterectomy (1980); Breast reduction surgery (1994); Rectocele repair (1990; 09/12/2006); Carpal tunnel release (Right, 1996); Knee arthroscopy (Right, 01/1999; 10/2000); Hemilaminotomy lumbar spine (Bilateral, 09/07/1999); Cholecystectomy; Flexible sigmoidoscopy (01/17/2012); Bilateral salpingoophorectomy (02/10/2001); Tarsal tunnel release (2002); Ureterolysis (Right, 02/10/2001); Lysis of adhesion (02/10/2001); Carpal tunnel release (Left, 03/21/2003); Tumor excision (Left, 03/21/2003); Esophagogastroduodenoscopy (egd) with esophageal dilation (12/02/2005); Carpal tunnel release (Right, 05/04/2013); Carpal tunnel release (Left, 09/21/2013); Bacterial overgrowth test (N/A, 07/13/2015); Esophagogastroduodenoscopy (egd) with propofol (N/A, 08/04/2015); Esophageal dilation (N/A, 08/04/2015); and Colonoscopy with propofol (N/A, 08/04/2015).  Current Outpatient Prescriptions on File Prior to Visit  Medication Sig  . azelastine (ASTELIN) 0.1 % nasal spray Place 2 sprays into both nostrils 2 (two) times daily.  . B-D UF III MINI PEN NEEDLES 31G X 5 MM MISC USE 6 TIMES DAILY AS DIRECTED  . cromolyn (OPTICROM) 4 % ophthalmic solution Place 1 drop into both eyes as needed (allergies).   . Esomeprazole Magnesium (NEXIUM 24HR PO) Take 20 mg by mouth daily.  . furosemide (LASIX) 40 MG tablet Take 1 tablet (40 mg total) by mouth daily.  Marland Kitchen glucose blood test strip Use as instructed to test blood sugar four times per day. DX E11.09  . HYDROcodone-homatropine (HYCODAN) 5-1.5 MG/5ML syrup Take 5 mLs  by mouth every 8 (eight) hours as needed for cough.  . inFLIXimab (REMICADE) 100 MG injection Inject into the vein every 8 (eight) weeks.   . insulin aspart (NOVOLOG) 100 unit/mL injection Inject 100 Units into the skin 3 (three) times daily before meals.  . Insulin Detemir (LEVEMIR) 100 UNIT/ML Pen Inject 70 Units into the skin at bedtime.  . Insulin Pen Needle (PEN NEEDLES 31GX5/16") 31G X 8 MM MISC Use pen needles with insulin pens to inject insulin as directed. DX E11.09  . lactase (LACTAID) 3000 UNITS tablet Take 3,000 Units by mouth as needed (when eating foods containing dairy).   Marland Kitchen loperamide (IMODIUM) 2 MG capsule Take 2 mg by mouth 3 (three) times daily as needed for diarrhea or loose stools.   . meclizine (ANTIVERT) 25 MG tablet Take 1 tablet (25 mg total) by mouth 3 (three) times daily as needed for dizziness or nausea.  . metoprolol succinate (TOPROL-XL) 100 MG 24 hr tablet Take 1 tablet (100 mg total) by mouth 2 (two) times daily. Take with or immediately following a meal.  . ONETOUCH DELICA LANCETS 16X MISC PATIENT USES ONE TOUCH DELICAL 09U LANCETS. Use as directed to check blood sugar up to 6 times per day. DX: E11.09  . potassium chloride (KLOR-CON M10) 10 MEQ tablet Take 2 tablets (20 mEq total) by mouth daily.  . rivaroxaban (XARELTO) 20 MG TABS tablet Take 1 tablet (20 mg total) by mouth daily with supper. (Patient taking differently: Take 20 mg by mouth daily with supper. )  . [DISCONTINUED] amitriptyline (ELAVIL) 25 MG tablet Take 1 tablet (25 mg total) by mouth at bedtime. start with 1/2 tab at night for 1 week then increase to 1 whole tab.  . [DISCONTINUED] bromocriptine (PARLODEL) 2.5 MG tablet Take 2.5 mg by mouth 2 (two) times daily.    . [DISCONTINUED] potassium chloride (K-DUR) 10 MEQ tablet  Take 2 tablets (20 mEq total) by mouth daily.   No current facility-administered medications on file prior to visit.    Allergies  Allergen Reactions  . Actos [Pioglitazone]  Other (See Comments)    EDEMA   . Benzocaine-Menthol Swelling    SWELLING OF MOUTH  . Colesevelam Other (See Comments)    GI UPSET  . Flagyl [Metronidazole Hcl] Other (See Comments)    DIAPHORESIS  . Metformin And Related Diarrhea  . Omeprazole Swelling    SWELLING OF TONGUE AND THROAT  . Shrimp [Shellfish Allergy] Itching    OF THROAT AND EARS  . Statins Other (See Comments)    HEART RACING  . Desipramine Hcl Itching, Nausea Only and Other (See Comments)    "swimmy" headed, ears itched   . Hydromorphone Itching  . Adhesive [Tape] Other (See Comments)    SKIN IRRITATION AND BRUISING  . Nisoldipine Itching  . Percocet [Oxycodone-Acetaminophen] Itching    Family History Her family history includes Alcohol abuse in her other; Asthma in her maternal grandfather; Colon cancer in her paternal grandfather; Diabetes in her mother and son; Emphysema in her maternal grandfather; Heart attack in her father; Heart disease in her father; Hypertension in her mother and son; Lung disease in her father; Stomach cancer in her paternal grandmother. There is no history of Neuropathy.  Social History She  reports that she has never smoked. She has never used smokeless tobacco. She reports that she drinks alcohol. She reports that she does not use illicit drugs.  Review of systems Constitutional: Negative for fever and unexpected weight change.  HENT: Positive for congestion, sneezing and trouble swallowing. Negative for dental problem, ear pain, nosebleeds, postnasal drip, rhinorrhea, sinus pressure and sore throat.   Eyes: Negative for redness and itching.  Respiratory: Positive for cough and shortness of breath. Negative for chest tightness and wheezing.   Cardiovascular: Negative for palpitations and leg swelling.  Gastrointestinal: Negative for nausea and vomiting.       Acid heartburn // Indigestion  Genitourinary: Negative for dysuria.  Musculoskeletal: Positive for arthralgias. Negative  for joint swelling.  Skin: Negative for rash ( itching).  Neurological: Negative for headaches.  Hematological: Does not bruise/bleed easily.  Psychiatric/Behavioral: Negative for dysphoric mood. The patient is not nervous/anxious.     Chief Complaint  Patient presents with  . SLEEP CONSULT    Referred by Dr Billey Gosling. Epworth Score: 10    Tests: Echo 08/04/15 >> mod LVH, EF 60 to 29%, grade 1 diastolic CHF PSG 51/88/41 >> AHI 72, SpO2 low 75%  Vital signs BP 130/78 mmHg  Pulse 60  Ht 5' 2"  (1.575 m)  Wt 220 lb (99.791 kg)  BMI 40.23 kg/m2  SpO2 96%  History of Present Illness [name] is a 70 y.o. female for evaluation of sleep problems.  Her husband was worried about her sleep.  He says she snores, and will stop breathing while asleep.  She wakes up sometimes feeling like her mouth is dry.  She has trouble sleeping on her back.  She will also talk in her sleep.  She goes to sleep at 12 but usually can't fall asleep until 3 am.  She will sleep until 10 am  She feels okay in the morning, but will sometimes get headaches.  She is not using anything to help sleep or stay awake.  She does have trouble with neuropathy and these symptoms do wake her up.  She denies sleep walking, bruxism, or  nightmares.  There is no history of restless legs.  She denies sleep hallucinations, sleep paralysis, or cataplexy.  The Epworth score is 10 out of 24.   Physical Exam:  General - No distress ENT - No sinus tenderness, no oral exudate, no LAN, no thyromegaly, TM clear, pupils equal/reactive, MP 2, triangular uvula Cardiac - s1s2 regular, no murmur, pulses symmetric Chest - No wheeze/rales/dullness, good air entry, normal respiratory excursion Back - No focal tenderness Abd - Soft, non-tender, no organomegaly, + bowel sounds Ext - No edema Neuro - Normal strength, cranial nerves intact Skin - No rashes Psych - Normal mood, and behavior  Discussion: She has snoring, sleep  disruption, witnessed apnea, and daytime sleepiness.  She has hx of HTN and DM.  Her recent sleep study showed severe obstructive sleep apnea.  We discussed how sleep apnea can affect various health problems, including risks for hypertension, cardiovascular disease, and diabetes.  We also discussed how sleep disruption can increase risks for accidents, such as while driving.  Weight loss as a means of improving sleep apnea was also reviewed.  Additional treatment options discussed were CPAP therapy, oral appliance, and surgical intervention.   Assessment/plan:  Obstructive sleep apnea. - will arrange for auto CPAP set up  Obesity. - discussed options to assist with weight loss   Patient Instructions  Will arrange for CPAP set up  Follow up in 2 months after CPAP set up     Chesley Mires, M.D. Pager [phone] 01/31/2016, 4:13 PM

## 2016-01-31 NOTE — Telephone Encounter (Signed)
lmtcb X1 for pt  

## 2016-01-31 NOTE — Progress Notes (Signed)
   Subjective:    Patient ID: [name], female    DOB: [birth year], 70 y.o.   MRN: [id number]  HPI    Review of Systems  Constitutional: Negative for fever and unexpected weight change.  HENT: Positive for congestion, sneezing and trouble swallowing. Negative for dental problem, ear pain, nosebleeds, postnasal drip, rhinorrhea, sinus pressure and sore throat.   Eyes: Negative for redness and itching.  Respiratory: Positive for cough and shortness of breath. Negative for chest tightness and wheezing.   Cardiovascular: Negative for palpitations and leg swelling.  Gastrointestinal: Negative for nausea and vomiting.       Acid heartburn // Indigestion  Genitourinary: Negative for dysuria.  Musculoskeletal: Positive for arthralgias. Negative for joint swelling.  Skin: Negative for rash ( itching).  Neurological: Negative for headaches.  Hematological: Does not bruise/bleed easily.  Psychiatric/Behavioral: Negative for dysphoric mood. The patient is not nervous/anxious.        Objective:   Physical Exam        Assessment & Plan:

## 2016-02-01 NOTE — Telephone Encounter (Signed)
Last ov with VS on 01/31/16 Patient Instructions     Will arrange for CPAP set up  Follow up in 2 months after CPAP set up   Called spoke with pt. Reviewed VS's recs. She voiced understanding and had no further questions. Nothing further needed.

## 2016-02-13 ENCOUNTER — Encounter: Payer: Self-pay | Admitting: Internal Medicine

## 2016-02-13 ENCOUNTER — Other Ambulatory Visit (INDEPENDENT_AMBULATORY_CARE_PROVIDER_SITE_OTHER): Payer: Medicare Other | Admitting: *Deleted

## 2016-02-13 ENCOUNTER — Ambulatory Visit (INDEPENDENT_AMBULATORY_CARE_PROVIDER_SITE_OTHER): Payer: Medicare Other | Admitting: Internal Medicine

## 2016-02-13 VITALS — BP 122/72 | HR 64 | Temp 97.9°F | Resp 12 | Wt 220.8 lb

## 2016-02-13 DIAGNOSIS — M792 Neuralgia and neuritis, unspecified: Secondary | ICD-10-CM

## 2016-02-13 DIAGNOSIS — IMO0001 Reserved for inherently not codable concepts without codable children: Secondary | ICD-10-CM

## 2016-02-13 DIAGNOSIS — G629 Polyneuropathy, unspecified: Secondary | ICD-10-CM

## 2016-02-13 DIAGNOSIS — E1165 Type 2 diabetes mellitus with hyperglycemia: Secondary | ICD-10-CM | POA: Diagnosis not present

## 2016-02-13 LAB — POCT GLYCOSYLATED HEMOGLOBIN (HGB A1C): Hemoglobin A1C: 7

## 2016-02-13 LAB — VITAMIN B12: VITAMIN B 12: 215 pg/mL (ref 211–911)

## 2016-02-13 MED ORDER — DULAGLUTIDE 0.75 MG/0.5ML ~~LOC~~ SOAJ: SUBCUTANEOUS | Status: DC

## 2016-02-13 NOTE — Progress Notes (Signed)
Patient ID: [name], female   DOB: [birth year], 70 y.o.   MRN: [id number]  HPI: [name] is a 70 y.o.-year-old female,returning for f/u for DM2, dx in 2010, insulin-dependent, uncontrolled, with complications (peripheral neuropathy).  She saw Dr. Loanne Drilling before (up to 2015).  Since last visit, she started using a CPAP >> she sleeps much better.  Last hemoglobin A1c was: Lab Results  Component Value Date   HGBA1C 6.2 11/14/2015   HGBA1C 6.6* 08/29/2015   HGBA1C 6.5 02/21/2015   Pt was on a regimen of: - Levemir 60 units at bedtime - Novolog 130 units 3x a day, before meals + 60 units at bedtime She was on Metformin >> caused her UC (diarrhea) to be worse.  We started to decrease the Novolog at last visit: - Levemir 60 >> 70 units every night - NovoLog 100 units 3x a day (inject 15 min before a meal) She also Metformin ER 500 mg 2x a day with meal >> diarrhea She stopped Jardiance 25 mg daily in am >> called: weak legged, nauseated, swimmy headed, and trembly  Pt checks her sugars 2-3x a day and they are: - am: 82-226, 257 >> 73 x1 at 4 am, 155-309 - 2h after b'fast: n/c >>  - before lunch: 120-206 >> 110-214 - 2h after lunch: n/c >> 146, 353 - before dinner: 56, 92-158 >> 86, 98-189 - 2h after dinner: n/c - bedtime: 93-248 >> 105-233, 372 - nighttime: 65-69 >> n/c Lowest sugar was 60  - at night - frequently; she has hypoglycemia awareness at 90s.  Highest sugar was 268.  Glucometer: One Touch Ultra Mini  Pt's meals are: - Breakfast: sausage, toast, eggs - Lunch: sandwich, chips or fruit - Dinner: chicken, potatoes, salad - Snacks: fruit  - no CKD, last BUN/creatinine:  Lab Results  Component Value Date   BUN 16 11/03/2015   CREATININE 0.95 11/03/2015   - last set of lipids: Lab Results  Component Value Date   CHOL 230* 08/29/2015   HDL 42.90 08/29/2015   LDLCALC 153* 08/29/2015   LDLDIRECT 69.5 01/24/2011   TRIG 175.0* 08/29/2015   CHOLHDL 5  08/29/2015   - last eye exam was in 03/2015. No DR. Dr. [name] Fitch. - + numbness and tingling in her feet. + small fiber neuropathy and fibromyalgia. On Neurontin.   She sees Dr Stanford Breed for A fib. On Xarelto.   ROS: Constitutional: + weight gain, + fatigue, no hot flushes Eyes: no blurry vision, no xerophthalmia ENT: no sore throat, no nodules palpated in throat, + dysphagia/no odynophagia, + hoarseness, no tinnitus, no hypoacusis Cardiovascular: no CP/SOB/no palpitations/leg swelling Respiratory: no cough/SOB Gastrointestinal:no N/V/D/C, no heartburn Musculoskeletal: no muscle aches/joint aches Skin: no rashes Neurological: no tremors/numbness/tingling/dizziness, no HA, + neuropathic pain in hands and feet  I reviewed pt's medications, allergies, PMH, social hx, family hx, and changes were documented in the history of present illness. Otherwise, unchanged from my initial visit note.  Past Medical History  Diagnosis Date  . GERD   . Osteoarthritis   . HYPERTENSION   . IDDM (insulin dependent diabetes mellitus) ([place])   . Ulcerative colitis (HCC)     Remicade infusion Q 8 weeks  . History of thrombocytopenia 12/2011  . 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 Dental bridge present     lower  . Anxiety   . Complication of anesthesia   . OSA (obstructive sleep apnea) 09/14/2015  sleep study 09/07/15: severe obstructive sleep apnea with an AHI of 72 and SaO2 low of 75%.>refer to sleep med for eval and tx options   Past Surgical History  Procedure Laterality Date  . Abdominal hysterectomy  1980    partial  . Breast reduction surgery  1994  . Rectocele repair  1990; 09/12/2006  . Carpal tunnel release Right 1996  . Knee arthroscopy Right 01/1999; 10/2000  . Hemilaminotomy lumbar spine Bilateral 09/07/1999    L4-5  . Cholecystectomy    . Flexible sigmoidoscopy  01/17/2012    Procedure: FLEXIBLE SIGMOIDOSCOPY;  Surgeon: Rogene Houston, MD;  [facility];  Service: Endoscopy;  Laterality: N/A;  . Bilateral salpingoophorectomy  02/10/2001  . Tarsal tunnel release  2002  . Ureterolysis Right 02/10/2001  . Lysis of adhesion  02/10/2001  . Carpal tunnel release Left 03/21/2003  . 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 Esophagogastroduodenoscopy (egd) with esophageal dilation  12/02/2005  . Carpal tunnel release Right 05/04/2013    Procedure: CARPAL TUNNEL RELEASE;  Surgeon: Cammie Sickle., MD;  Location: [facility];  Service: Orthopedics;  Laterality: Right;  . Carpal tunnel release Left 09/21/2013    Procedure: LEFT CARPAL TUNNEL RELEASE;  Surgeon: Cammie Sickle., MD;  Location: [facility];  Service: Orthopedics;  Laterality: Left;  . Bacterial overgrowth test N/A 07/13/2015    Procedure: BACTERIAL OVERGROWTH TEST;  Surgeon: Rogene Houston, MD;  [facility];  Service: Endoscopy;  Laterality: N/A;  730    . Esophagogastroduodenoscopy (egd) with propofol N/A 08/04/2015    Procedure: ESOPHAGOGASTRODUODENOSCOPY (EGD) WITH PROPOFOL;  Surgeon: Rogene Houston, MD;  Location: [facility];  Service: Endoscopy;  Laterality: N/A;  procedure 1  . Esophageal dilation N/A 08/04/2015    Procedure: ESOPHAGEAL DILATION;  Surgeon: Rogene Houston, MD;  Location: [facility];  Service: Endoscopy;  Laterality: N/A;  [place] 57, no mucousal disruption  . Colonoscopy with propofol N/A 08/04/2015    Procedure: COLONOSCOPY WITH PROPOFOL;  Surgeon: Rogene Houston, MD;  Location: [facility];  Service: Endoscopy;  Laterality: N/A;  cecum time in  0820   time out  0827    total time 7 minutes   Social History   Social History  . Marital Status: Married    Spouse Name: N/A  . Number of Children: 1   Occupational History  . 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 Smoking status: Never Smoker   . Smokeless tobacco: Never Used  . Alcohol Use: 0.0 oz/week    0 Standard drinks or equivalent per  week     Comment: very rare  . Drug Use: No  . Sexual Activity:    Partners: Male   Social History Narrative   She lives with husband in two-story home.  They have one grown son and 2 grandchildren.   She is retired [employer].   Highest of level education:  college   Current Outpatient Prescriptions on File Prior to Visit  Medication Sig Dispense Refill  . azelastine (ASTELIN) 0.1 % nasal spray Place 2 sprays into both nostrils 2 (two) times daily. 30 mL 1  . B-D UF III MINI PEN NEEDLES 31G X 5 MM MISC USE 6 TIMES DAILY AS DIRECTED 540 each 2  . cromolyn (OPTICROM) 4 % ophthalmic solution Place 1 drop into both eyes as needed (allergies).     Marland Kitchen  Esomeprazole Magnesium (NEXIUM 24HR PO) Take 20 mg by mouth daily.    . furosemide (LASIX) 40 MG tablet Take 1 tablet (40 mg total) by mouth daily. 90 tablet 3  . glucose blood test strip Use as instructed to test blood sugar four times per day. DX E11.09 500 each 1  . HYDROcodone-homatropine (HYCODAN) 5-1.5 MG/5ML syrup Take 5 mLs by mouth every 8 (eight) hours as needed for cough. 240 mL 0  . inFLIXimab (REMICADE) 100 MG injection Inject into the vein every 8 (eight) weeks.     . insulin aspart (NOVOLOG) 100 unit/mL injection Inject 100 Units into the skin 3 (three) times daily before meals.    . Insulin Detemir (LEVEMIR) 100 UNIT/ML Pen Inject 70 Units into the skin at bedtime. 60 pen 1  . Insulin Pen Needle (PEN NEEDLES 31GX5/16") 31G X 8 MM MISC Use pen needles with insulin pens to inject insulin as directed. DX E11.09 600 each 1  . lactase (LACTAID) 3000 UNITS tablet Take 3,000 Units by mouth as needed (when eating foods containing dairy).     Marland Kitchen loperamide (IMODIUM) 2 MG capsule Take 2 mg by mouth 3 (three) times daily as needed for diarrhea or loose stools.     . meclizine (ANTIVERT) 25 MG tablet Take 1 tablet (25 mg total) by mouth 3 (three) times daily as needed for dizziness or nausea. 90 tablet 0  . metoprolol succinate  (TOPROL-XL) 100 MG 24 hr tablet Take 1 tablet (100 mg total) by mouth 2 (two) times daily. Take with or immediately following a meal. 90 tablet 1  . ONETOUCH DELICA LANCETS 28U MISC PATIENT USES ONE TOUCH DELICAL 13K LANCETS. Use as directed to check blood sugar up to 6 times per day. DX: E11.09 500 each 3  . potassium chloride (KLOR-CON M10) 10 MEQ tablet Take 2 tablets (20 mEq total) by mouth daily. 180 tablet 3  . rivaroxaban (XARELTO) 20 MG TABS tablet Take 1 tablet (20 mg total) by mouth daily with supper. (Patient taking differently: Take 20 mg by mouth daily with supper. ) 90 tablet 1  . [DISCONTINUED] amitriptyline (ELAVIL) 25 MG tablet Take 1 tablet (25 mg total) by mouth at bedtime. start with 1/2 tab at night for 1 week then increase to 1 whole tab. 30 tablet 3  . [DISCONTINUED] bromocriptine (PARLODEL) 2.5 MG tablet Take 2.5 mg by mouth 2 (two) times daily.      . [DISCONTINUED] potassium chloride (K-DUR) 10 MEQ tablet Take 2 tablets (20 mEq total) by mouth daily. 90 tablet 1   No current facility-administered medications on file prior to visit.   Allergies  Allergen Reactions  . Actos [Pioglitazone] Other (See Comments)    EDEMA   . Benzocaine-Menthol Swelling    SWELLING OF MOUTH  . Colesevelam Other (See Comments)    GI UPSET  . Flagyl [Metronidazole Hcl] Other (See Comments)    DIAPHORESIS  . Metformin And Related Diarrhea  . Omeprazole Swelling    SWELLING OF TONGUE AND THROAT  . Shrimp [Shellfish Allergy] Itching    OF THROAT AND EARS  . Statins Other (See Comments)    HEART RACING  . Desipramine Hcl Itching, Nausea Only and Other (See Comments)    "swimmy" headed, ears itched   . Hydromorphone Itching  . Adhesive [Tape] Other (See Comments)    SKIN IRRITATION AND BRUISING  . Nisoldipine Itching  . Percocet [Oxycodone-Acetaminophen] Itching   Family History  Problem Relation Age of Onset  .  Diabetes Mother   . Hypertension Mother   . Heart attack Father      Mid 35's  . Heart disease Father   . Lung disease Father     spot on lung; had lung surgery  . Alcohol abuse Other   . Hypertension Son   . Diabetes Son   . Neuropathy Neg Hx   . Emphysema Maternal Grandfather   . Asthma Maternal Grandfather   . Colon cancer Paternal Grandfather   . Stomach cancer Paternal Grandmother    PE: BP 122/72 mmHg  Pulse 64  Temp(Src) 97.9 F (36.6 C) (Oral)  Resp 12  Wt 220 lb 12.8 oz (100.154 kg)  SpO2 94% Wt Readings from Last 3 Encounters:  02/13/16 220 lb 12.8 oz (100.154 kg)  01/31/16 220 lb (99.791 kg)  01/19/16 219 lb (99.338 kg)   Constitutional: overweight, in NAD Eyes: PERRLA, EOMI, no exophthalmos ENT: moist mucous membranes, no thyromegaly, no cervical lymphadenopathy Cardiovascular: RRR, No MRG Respiratory: CTA B Gastrointestinal: abdomen soft, NT, ND, BS+ Musculoskeletal: no deformities, strength intact in all 4 Skin: moist, warm, no rashes Neurological: no tremor with outstretched hands, DTR normal in all 4  ASSESSMENT: 1. DM2, insulin-dependent, uncontrolled, with complications - PN  2. Peripheral neuropathic pain - both legs and hands  PLAN:  1. Patient with long-standing, uncontrolled diabetes, on basal-bolus insulin regimen, with a large preponderence of short acting vs long acting insulin. We started to decrease the Novolog at last visit, but she could not tolerate the Metformin ER and the SGLT2 inh >> sugars are higher and more variable now >> will give her a more flexible Novolog regimen and try to start Trulicity. - she will let me know if she decisdes for a referral to nutrition. We did discuss about diet - I suggested to:  Patient Instructions  Please continue Levemir 70 units at bedtime.  Please change the Novolog doses as follows: - 30 units for a snack with >15g carbs - 80 units for a small meal (e.g. Salad) - 90 units for a regular meal (e.g. Sandwich) - 100 units for a larger meal (e.g. Pizza) or if you  have dessert  Please start Trulicity 4.49 mg injection once a week. If you tolerate this well after the first 2 injections, please increase the dose to 1.5 mg weekly (2 injections) and let me know so I can send a refill of the 1.5 mg dose.  If you decide to restart Jardiance, please take 1/2 of the Lasix tablet a day.   Please return in 1.5 months with your sugar log.   - continue checking sugars at different times of the day - check 2 times a day, rotating checks - advised for yearly eye exams >> she is UTD - check HbA1c today >> 7% (higher, but still at goal) - Return to clinic in 1.5 mo with sugar log   2. Peripheral neuropathic pain  - will check a B12 vitamin level If normal, will suggest to start a MVI or B12 po or B complex or Metanx If low >> will suggest im B12 Will also need to add alpha-lipoic acid 600 mg 2x a day.  Orders Only on 02/13/2016  Component Date Value Ref Range Status  . Hemoglobin A1C 02/13/2016 7.0   Final  Office Visit on 02/13/2016  Component Date Value Ref Range Status  . Vitamin B-12 02/13/2016 215  211 - 911 pg/mL Final    Vitamin B-12 is quite low >> I  will suggest intramuscular B12 injections for the next 6 months. She can either have them here or through her PCP. Will add the alpha lipoid acid if her neuropathy does not improve after the B12 injections.

## 2016-02-13 NOTE — Patient Instructions (Addendum)
Please continue Levemir 70 units at bedtime.  Please change the Novolog doses as follows: - 30 units for a snack with >15g carbs - 80 units for a small meal (e.g. Salad) - 90 units for a regular meal (e.g. Sandwich) - 100 units for a larger meal (e.g. Pizza) or if you have dessert  Please start Trulicity 9.84 mg injection once a week. If you tolerate this well after the first 2 injections, please increase the dose to 1.5 mg weekly (2 injections) and let me know so I can send a refill of the 1.5 mg dose.  If you decide to restart Jardiance, please take 1/2 of the Lasix tablet a day.   Please return in 1.5 months with your sugar log.

## 2016-02-16 ENCOUNTER — Telehealth: Payer: Self-pay | Admitting: Internal Medicine

## 2016-02-16 ENCOUNTER — Other Ambulatory Visit: Payer: Self-pay | Admitting: *Deleted

## 2016-02-16 MED ORDER — CYANOCOBALAMIN 1000 MCG/ML IJ SOLN: 1000.0000 ug | Freq: Once | INTRAMUSCULAR | Status: DC

## 2016-02-16 MED ORDER — "SYRINGE 22G X 1"" 3 ML MISC": Status: DC

## 2016-02-16 NOTE — Telephone Encounter (Signed)
error 

## 2016-02-16 NOTE — Telephone Encounter (Signed)
Per Dr Cruzita Lederer:  Send the Rx for B12 and syringes - the ones we use here? Need 6 doses of im and then switch to po B12, but will need a B12 level after she finishes the im B12. Sending rx today.

## 2016-03-06 ENCOUNTER — Telehealth: Payer: Self-pay | Admitting: Internal Medicine

## 2016-03-06 MED ORDER — DULAGLUTIDE 0.75 MG/0.5ML ~~LOC~~ SOAJ: SUBCUTANEOUS | Status: DC

## 2016-03-06 NOTE — Telephone Encounter (Signed)
Patient need as refill of mediation Trulicity send to   [id number]- [name]  - [phone](Phone) [phone](Fax)       She her take on injectionThursdays, She also stated that she has been feeling nausea and lightheaded

## 2016-03-07 ENCOUNTER — Telehealth: Payer: Self-pay | Admitting: Internal Medicine

## 2016-03-07 NOTE — Telephone Encounter (Signed)
Pt needs the new rx for the 1.5 mg of the trulicity called in please

## 2016-03-08 MED ORDER — DULAGLUTIDE 0.75 MG/0.5ML ~~LOC~~ SOAJ: SUBCUTANEOUS | Status: DC

## 2016-03-08 NOTE — Addendum Note (Signed)
Addended by: Verlin Grills T on: 03/08/2016 10:18 AM   Modules accepted: Orders

## 2016-03-08 NOTE — Telephone Encounter (Signed)
rx submitted per pt's request.

## 2016-03-15 ENCOUNTER — Encounter (HOSPITAL_COMMUNITY): Payer: Medicare Other

## 2016-03-19 ENCOUNTER — Encounter (HOSPITAL_COMMUNITY)
Admission: RE | Admit: 2016-03-19 | Discharge: 2016-03-19 | Disposition: A | Discharge status: Home / Self Care | Payer: Medicare Other | Source: Ambulatory Visit | Attending: Internal Medicine | Admitting: Internal Medicine

## 2016-03-19 DIAGNOSIS — K5190 Ulcerative colitis, unspecified, without complications: Secondary | ICD-10-CM | POA: Insufficient documentation

## 2016-03-19 MED ORDER — ACETAMINOPHEN 325 MG PO TABS
650.0000 mg | ORAL_TABLET | Freq: Once | ORAL | Status: AC
  Administered 2016-03-19: 650 mg via ORAL
  Filled 2016-03-19: qty 2

## 2016-03-19 MED ORDER — SODIUM CHLORIDE 0.9 % IV SOLN
500.0000 mg | INTRAVENOUS | Status: DC
  Administered 2016-03-19: 500 mg via INTRAVENOUS
  Filled 2016-03-19: qty 50

## 2016-03-19 MED ORDER — LORATADINE 10 MG PO TABS
10.0000 mg | ORAL_TABLET | Freq: Once | ORAL | Status: AC
  Administered 2016-03-19: 10 mg via ORAL
  Filled 2016-03-19: qty 1

## 2016-03-19 MED ORDER — SODIUM CHLORIDE 0.9 % IV SOLN
INTRAVENOUS | Status: DC
  Administered 2016-03-19: 12:00:00 via INTRAVENOUS

## 2016-03-27 ENCOUNTER — Telehealth: Payer: Self-pay | Admitting: Internal Medicine

## 2016-03-27 NOTE — Telephone Encounter (Signed)
I contacted the pt and advised Dr. Cruzita Lederer is currently out of the office and will return in the morning. Pt will be called back once Dr. Cruzita Lederer returns and advises how to proceed.

## 2016-03-27 NOTE — Telephone Encounter (Signed)
Since trulicity start date she has been nausous, swimmy headed, and hot flashes blood sugars are higher with this med as well please advise asap

## 2016-03-28 ENCOUNTER — Telehealth: Payer: Self-pay

## 2016-03-28 NOTE — Telephone Encounter (Signed)
Called patient to inform her to record her b/s readings and report them after the weekend. No answer and no voicemail to leave message. Will try again later.

## 2016-03-28 NOTE — Telephone Encounter (Signed)
She needs to let us know if her sugars stay high after this >> call with sugars after the weekend.

## 2016-03-28 NOTE — Telephone Encounter (Signed)
See note below and please advise, Thanks! 

## 2016-03-28 NOTE — Telephone Encounter (Signed)
[name], can you please call pt: Did she have these symptoms only with a 1.5 mg weekly dose or even with a 0.75 mg weekly dose? If she only had the symptoms with the higher dose, we can switch back to the lower dose. If she had them with both, we need to stop.

## 2016-03-28 NOTE — Telephone Encounter (Signed)
Called patient back from yesterday, with trulicity medication issues. Patient states she is having those symptoms with both the 1.66m and with the 0.782mdoses. I read to her your response in stopping the medication, and would notify you of those changes. If any other adjustments needed to be made, I would contact the patient with those.

## 2016-03-29 ENCOUNTER — Telehealth: Payer: Self-pay

## 2016-03-29 DIAGNOSIS — M25562 Pain in left knee: Secondary | ICD-10-CM | POA: Diagnosis not present

## 2016-03-29 DIAGNOSIS — M25561 Pain in right knee: Secondary | ICD-10-CM | POA: Diagnosis not present

## 2016-03-29 DIAGNOSIS — M170 Bilateral primary osteoarthritis of knee: Secondary | ICD-10-CM | POA: Diagnosis not present

## 2016-03-29 DIAGNOSIS — M1712 Unilateral primary osteoarthritis, left knee: Secondary | ICD-10-CM | POA: Diagnosis not present

## 2016-03-29 DIAGNOSIS — M1711 Unilateral primary osteoarthritis, right knee: Secondary | ICD-10-CM | POA: Diagnosis not present

## 2016-03-29 NOTE — Telephone Encounter (Signed)
Attempted to reach patient again, to notify of calling back after the weekend with sugars. No answer. Will call patient back next week if no response.

## 2016-04-03 ENCOUNTER — Telehealth: Payer: Self-pay | Admitting: Adult Health

## 2016-04-03 NOTE — Telephone Encounter (Signed)
lmtcb x1 for pt. TP does not have any openings on 04/16/2016.

## 2016-04-03 NOTE — Telephone Encounter (Signed)
Patient [name], informed patient that there are not opening for her to be seen on this day, and that she can call us to check as frequently as she would like to see if TP has had any cancellations for 7/18, pt verbalized understanding nothing further is needed

## 2016-04-09 ENCOUNTER — Encounter: Payer: Self-pay | Admitting: Adult Health

## 2016-04-09 ENCOUNTER — Ambulatory Visit (INDEPENDENT_AMBULATORY_CARE_PROVIDER_SITE_OTHER): Payer: Medicare Other | Admitting: Adult Health

## 2016-04-09 VITALS — BP 152/84 | HR 71 | Temp 97.8°F | Ht 62.0 in | Wt 216.0 lb

## 2016-04-09 DIAGNOSIS — G4733 Obstructive sleep apnea (adult) (pediatric): Secondary | ICD-10-CM | POA: Diagnosis not present

## 2016-04-09 NOTE — Progress Notes (Signed)
Subjective:    Patient ID: [name], female    DOB: 1946-06-02, 70 y.o.   MRN: [id number]  HPI 70 yo female with severe OSA   TEST  AHI of 72 and SaO2 low of 75%.  04/09/2016 Follow up : OSA  Pt returns for 2 month follow-up . Was recently started on C Pap for severe sleep apnea.  Says she is doing very well. Feels much more rested. Download June 11 through 04/08/2016 showed excellent compliance with average use at 7.5 hours. She is on AutoSet 5-15 cm of H2O. AHI 2.0. Minimal leaks . She would like to try new smaller mask , nasal mask. Has full face mask .    Past Medical History  Diagnosis Date  . GERD   . Osteoarthritis   . HYPERTENSION   . IDDM (insulin dependent diabetes mellitus) ([place])   . Ulcerative colitis (HCC)     Remicade infusion Q 8 weeks  . History of thrombocytopenia 12/2011  . 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 Dental bridge present     lower  . Anxiety   . Complication of anesthesia   . OSA (obstructive sleep apnea) 09/14/2015    sleep study 09/07/15: severe obstructive sleep apnea with an AHI of 72 and SaO2 low of 75%.>refer to sleep med for eval and tx options   Current Outpatient Prescriptions on File Prior to Visit  Medication Sig Dispense Refill  . azelastine (ASTELIN) 0.1 % nasal spray Place 2 sprays into both nostrils 2 (two) times daily. 30 mL 1  . B-D UF III MINI PEN NEEDLES 31G X 5 MM MISC USE 6 TIMES DAILY AS DIRECTED 540 each 2  . cromolyn (OPTICROM) 4 % ophthalmic solution Place 1 drop into both eyes as needed (allergies).     . cyanocobalamin (,VITAMIN B-12,) 1000 MCG/ML injection Inject 1 mL (1,000 mcg total) into the muscle once. 1 mL 5  . Dulaglutide (TRULICITY) 4.00 QQ/7.6PP SOPN Inject 1.5 mg weekly under skin 12 pen 2  . Esomeprazole Magnesium (NEXIUM 24HR PO) Take 20 mg by mouth daily.    . furosemide (LASIX) 40 MG tablet Take 1 tablet (40 mg total) by mouth daily. 90 tablet 3  . glucose blood test strip  Use as instructed to test blood sugar four times per day. DX E11.09 500 each 1  . inFLIXimab (REMICADE) 100 MG injection Inject into the vein every 8 (eight) weeks.     . insulin aspart (NOVOLOG) 100 unit/mL injection Inject 100 Units into the skin 3 (three) times daily before meals.    . Insulin Detemir (LEVEMIR) 100 UNIT/ML Pen Inject 70 Units into the skin at bedtime. 60 pen 1  . Insulin Pen Needle (PEN NEEDLES 31GX5/16") 31G X 8 MM MISC Use pen needles with insulin pens to inject insulin as directed. DX E11.09 600 each 1  . lactase (LACTAID) 3000 UNITS tablet Take 3,000 Units by mouth as needed (when eating foods containing dairy).     Marland Kitchen loperamide (IMODIUM) 2 MG capsule Take 2 mg by mouth 3 (three) times daily as needed for diarrhea or loose stools.     . meclizine (ANTIVERT) 25 MG tablet Take 1 tablet (25 mg total) by mouth 3 (three) times daily as needed for dizziness or nausea. 90 tablet 0  . metoprolol succinate (TOPROL-XL) 100 MG 24 hr tablet Take 1 tablet (100 mg total) by mouth 2 (two) times daily. Take with or immediately  following a meal. 90 tablet 1  . ONETOUCH DELICA LANCETS 40G MISC PATIENT USES ONE TOUCH DELICAL 86P LANCETS. Use as directed to check blood sugar up to 6 times per day. DX: E11.09 500 each 3  . potassium chloride (KLOR-CON M10) 10 MEQ tablet Take 2 tablets (20 mEq total) by mouth daily. 180 tablet 3  . rivaroxaban (XARELTO) 20 MG TABS tablet Take 1 tablet (20 mg total) by mouth daily with supper. (Patient taking differently: Take 20 mg by mouth daily with supper. ) 90 tablet 1  . Syringe/Needle, Disp, (SYRINGE 3CC/22GX1") 22G X 1" 3 ML MISC Use to inject Vit B12 once monthly. 25 each 0  . [DISCONTINUED] amitriptyline (ELAVIL) 25 MG tablet Take 1 tablet (25 mg total) by mouth at bedtime. start with 1/2 tab at night for 1 week then increase to 1 whole tab. 30 tablet 3  . [DISCONTINUED] bromocriptine (PARLODEL) 2.5 MG tablet Take 2.5 mg by mouth 2 (two) times daily.      .  [DISCONTINUED] potassium chloride (K-DUR) 10 MEQ tablet Take 2 tablets (20 mEq total) by mouth daily. 90 tablet 1   No current facility-administered medications on file prior to visit.      Review of Systems Constitutional:   No  weight loss, night sweats,  Fevers, chills, fatigue, or  lassitude.  HEENT:   No headaches,  Difficulty swallowing,  Tooth/dental problems, or  Sore throat,                No sneezing, itching, ear ache, nasal congestion, post nasal drip,   CV:  No chest pain,  Orthopnea, PND, swelling in lower extremities, anasarca, dizziness, palpitations, syncope.   GI  No heartburn, indigestion, abdominal pain, nausea, vomiting, diarrhea, change in bowel habits, loss of appetite, bloody stools.   Resp: No shortness of breath with exertion or at rest.  No excess mucus, no productive cough,  No non-productive cough,  No coughing up of blood.  No change in color of mucus.  No wheezing.  No chest wall deformity  Skin: no rash or lesions.  GU: no dysuria, change in color of urine, no urgency or frequency.  No flank pain, no hematuria   MS:  No joint pain or swelling.  No decreased range of motion.  No back pain.  Psych:  No change in mood or affect. No depression or anxiety.  No memory loss.         Objective:   Physical Exam  Filed Vitals:   04/09/16 1546  BP: 152/84  Pulse: 71  Temp: 97.8 F (36.6 C)  TempSrc: Oral  Height: 5' 2"  (1.575 m)  Weight: 216 lb (97.977 kg)  SpO2: 94%  Body mass index is 39.5 kg/(m^2).   GEN: A/Ox3; pleasant , NAD, obese   HEENT:  [place]/AT,  EACs-clear, TMs-wnl, NOSE-clear, THROAT-clear, no lesions, no postnasal drip or exudate noted. Class 2-3 MP airway   NECK:  Supple w/ fair ROM; no JVD; normal carotid impulses w/o bruits; no thyromegaly or nodules palpated; no lymphadenopathy.  RESP  Clear  P & A; w/o, wheezes/ rales/ or rhonchi.no accessory muscle use, no dullness to percussion  CARD:  RRR, no m/r/g  , no peripheral edema,  pulses intact, no cyanosis or clubbing.  GI:   Soft & nt; nml bowel sounds; no organomegaly or masses detected.  Musco: Warm bil, no deformities or joint swelling noted.   Neuro: alert, no focal deficits noted.    Skin: Warm, no  lesions or rashes  [name] Parrett NP-C  [facility] . 04/09/2016       Assessment & Plan:

## 2016-04-09 NOTE — Assessment & Plan Note (Signed)
Doing very well on CPAP  Order for nasal mask with chin strap  Plan  Wear CPAP nightly Work on Weight loss Do not drive if sleepy Follow up with Dr. Halford Chessman  in 6 months and as needed

## 2016-04-09 NOTE — Addendum Note (Signed)
Addended by: Osa Craver on: 04/09/2016 05:16 PM   Modules accepted: Orders

## 2016-04-09 NOTE — Patient Instructions (Signed)
Wear CPAP nightly Work on Weight loss Do not drive if sleepy Follow up with Dr. Halford Chessman  in 6 months and as needed

## 2016-04-10 NOTE — Progress Notes (Signed)
Agree with assessment/plan.  Chesley Mires, MD [facility] 04/10/2016, 4:23 PM Pager:  [phone]

## 2016-04-16 ENCOUNTER — Other Ambulatory Visit: Payer: Self-pay

## 2016-04-16 ENCOUNTER — Encounter: Payer: Self-pay | Admitting: Internal Medicine

## 2016-04-16 ENCOUNTER — Ambulatory Visit (INDEPENDENT_AMBULATORY_CARE_PROVIDER_SITE_OTHER): Payer: Medicare Other | Admitting: Internal Medicine

## 2016-04-16 VITALS — BP 130/88 | HR 72 | Ht 62.0 in | Wt 217.0 lb

## 2016-04-16 DIAGNOSIS — E538 Deficiency of other specified B group vitamins: Secondary | ICD-10-CM

## 2016-04-16 DIAGNOSIS — E1165 Type 2 diabetes mellitus with hyperglycemia: Secondary | ICD-10-CM | POA: Diagnosis not present

## 2016-04-16 DIAGNOSIS — E1142 Type 2 diabetes mellitus with diabetic polyneuropathy: Secondary | ICD-10-CM

## 2016-04-16 DIAGNOSIS — IMO0002 Reserved for concepts with insufficient information to code with codable children: Secondary | ICD-10-CM

## 2016-04-16 MED ORDER — INSULIN REGULAR HUMAN (CONC) 500 UNIT/ML ~~LOC~~ SOPN: 60.0000 [IU] | PEN_INJECTOR | Freq: Three times a day (TID) | SUBCUTANEOUS | Status: DC

## 2016-04-16 MED ORDER — CYANOCOBALAMIN 1000 MCG/ML IJ SOLN: 1000.0000 ug | Freq: Once | INTRAMUSCULAR | Status: DC

## 2016-04-16 MED ORDER — DULAGLUTIDE 0.75 MG/0.5ML ~~LOC~~ SOAJ: SUBCUTANEOUS | Status: DC

## 2016-04-16 MED ORDER — "SYRINGE 22G X 1"" 3 ML MISC": Status: DC

## 2016-04-16 MED ORDER — INSULIN PEN NEEDLE 32G X 4 MM MISC: Status: DC

## 2016-04-16 MED ORDER — CYANOCOBALAMIN 1000 MCG/ML IJ SOLN
1000.0000 ug | Freq: Once | INTRAMUSCULAR | Status: AC
  Administered 2016-04-16: 1000 ug via INTRAMUSCULAR

## 2016-04-16 NOTE — Progress Notes (Signed)
Patient ID: [name], female   DOB: [birth year], 70 y.o.   MRN: [id number]  HPI: [name] is a 70 y.o.-year-old female,returning for f/u for DM2, dx in 2010, insulin-dependent, uncontrolled, with complications (peripheral neuropathy). Last visit 2 mo ago. She saw Dr. Loanne Drilling before (up to 2015). She has [place].   Had a knee steroid inj since last visit.  Last hemoglobin A1c was: Lab Results  Component Value Date   HGBA1C 7.0 02/13/2016   HGBA1C 6.2 11/14/2015   HGBA1C 6.6* 08/29/2015   Pt was on a regimen of: - Levemir 60 units at bedtime - Novolog 130 units 3x a day, before meals + 60 units at bedtime She was on Metformin >> caused her UC (diarrhea) to be worse.  She is on: - Levemir 70 units at bedtime. - Novolog doses as follows - usually 2 meals + snack: - 30 units for a snack with >15g carbs - did not use - 80 units for a small meal (e.g. Salad) - 90 units for a regular meal (e.g. Sandwich) - 100 units for a larger meal (e.g. Pizza) or if you have dessert - Trulicity 1.58 mg injection once a week She was also Metformin ER 500 mg 2x a day with meal >> diarrhea She stopped Jardiance 25 mg daily in am >> called: weak legged, nauseated, swimmy headed, and trembly  Pt checks her sugars 2-3x a day and they are: - am: 82-226, 257 >> 73 x1 at 4 am, 155-309 >> 141-250 (100 units) - 2h after b'fast: n/c >> n.c - before lunch: 120-206 >> 110-214 >> 86-137  - 2h after lunch: n/c >> 146, 353 >> 387 (100 units) - before dinner: 56, 92-158 >> 86, 98-189 >> 102, 167-193 (60-100 units) - 2h after dinner: n/c - bedtime: 93-248 >> 105-233, 372 >> 108-305 - nighttime: 65-69 >> n/c Lowest sugar was 100; she has hypoglycemia awareness at 90s.  Highest sugar was 268 >> 300s (steroids).  Glucometer: One Touch Ultra Mini  Pt's meals are: - Breakfast: sausage, toast, eggs - Lunch: sandwich, chips or fruit - Dinner: chicken, potatoes, salad - Snacks: fruit  - no CKD, last  BUN/creatinine:  Lab Results  Component Value Date   BUN 16 11/03/2015   CREATININE 0.95 11/03/2015   - last set of lipids: Lab Results  Component Value Date   CHOL 230* 08/29/2015   HDL 42.90 08/29/2015   LDLCALC 153* 08/29/2015   LDLDIRECT 69.5 01/24/2011   TRIG 175.0* 08/29/2015   CHOLHDL 5 08/29/2015   - last eye exam was in 03/2015. No DR. Dr. [name] Fitch. - + numbness and tingling in her feet. + small fiber neuropathy and fibromyalgia. On Neurontin.   She sees Dr Stanford Breed for A fib. On Xarelto >> sweating, nausea, tinnitus.  At last visit, she was found to have B12 def >> started inj of B12  ROS: Constitutional: + weight gain, + fatigue, no hot flushes Eyes: no blurry vision, no xerophthalmia ENT: no sore throat, no nodules palpated in throat, no dysphagia/no odynophagia, + hoarseness Cardiovascular: no CP/SOB/no palpitations/leg swelling Respiratory: no cough/SOB Gastrointestinal:no N/V/D/C, no heartburn Musculoskeletal: no muscle aches/joint aches Skin: no rashes Neurological: no tremors/numbness/tingling/dizziness, no HA, + neuropathic pain in hands and feet  I reviewed pt's medications, allergies, PMH, social hx, family hx, and changes were documented in the history of present illness. Otherwise, unchanged from my initial visit note.  Past Medical History  Diagnosis Date  . GERD   .  Osteoarthritis   . HYPERTENSION   . IDDM (insulin dependent diabetes mellitus) ([place])   . Ulcerative colitis (HCC)     Remicade infusion Q 8 weeks  . History of thrombocytopenia 12/2011  . 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 Dental bridge present     lower  . Anxiety   . Complication of anesthesia   . OSA (obstructive sleep apnea) 09/14/2015    sleep study 09/07/15: severe obstructive sleep apnea with an AHI of 72 and SaO2 low of 75%.>refer to sleep med for eval and tx options   Past Surgical History  Procedure Laterality Date  . Abdominal hysterectomy   1980    partial  . Breast reduction surgery  1994  . Rectocele repair  1990; 09/12/2006  . Carpal tunnel release Right 1996  . Knee arthroscopy Right 01/1999; 10/2000  . Hemilaminotomy lumbar spine Bilateral 09/07/1999    L4-5  . Cholecystectomy    . Flexible sigmoidoscopy  01/17/2012    Procedure: FLEXIBLE SIGMOIDOSCOPY;  Surgeon: Rogene Houston, MD;  [facility];  Service: Endoscopy;  Laterality: N/A;  . Bilateral salpingoophorectomy  02/10/2001  . Tarsal tunnel release  2002  . Ureterolysis Right 02/10/2001  . Lysis of adhesion  02/10/2001  . Carpal tunnel release Left 03/21/2003  . 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 Esophagogastroduodenoscopy (egd) with esophageal dilation  12/02/2005  . Carpal tunnel release Right 05/04/2013    Procedure: CARPAL TUNNEL RELEASE;  Surgeon: Cammie Sickle., MD;  Location: [place];  Service: Orthopedics;  Laterality: Right;  . Carpal tunnel release Left 09/21/2013    Procedure: LEFT CARPAL TUNNEL RELEASE;  Surgeon: Cammie Sickle., MD;  Location: [place];  Service: Orthopedics;  Laterality: Left;  . Bacterial overgrowth test N/A 07/13/2015    Procedure: BACTERIAL OVERGROWTH TEST;  Surgeon: Rogene Houston, MD;  [facility];  Service: Endoscopy;  Laterality: N/A;  730    . Esophagogastroduodenoscopy (egd) with propofol N/A 08/04/2015    Procedure: ESOPHAGOGASTRODUODENOSCOPY (EGD) WITH PROPOFOL;  Surgeon: Rogene Houston, MD;  [facility];  Service: Endoscopy;  Laterality: N/A;  procedure 1  . Esophageal dilation N/A 08/04/2015    Procedure: ESOPHAGEAL DILATION;  Surgeon: Rogene Houston, MD;  [facility];  Service: Endoscopy;  Laterality: N/A;  Maloney 56, no mucousal disruption  . Colonoscopy with propofol N/A 08/04/2015    Procedure: COLONOSCOPY WITH PROPOFOL;  Surgeon: Rogene Houston, MD;  [facility];  Service: Endoscopy;  Laterality: N/A;  cecum  time in  0820   time out  0827    total time 7 minutes   Social History   Social History  . Marital Status: Married    Spouse Name: N/A  . Number of Children: 1   Occupational History  . 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 Smoking status: Never Smoker   . Smokeless tobacco: Never Used  . Alcohol Use: 0.0 oz/week    0 Standard drinks or equivalent per week     Comment: very rare  . Drug Use: No  . Sexual Activity:    Partners: Male   Social History Narrative   She lives with husband in two-story home.  They have one grown son and 2 grandchildren.   She is retired [employer].   Highest of level education:  college  Current Outpatient Prescriptions on File Prior to Visit  Medication Sig Dispense Refill  . azelastine (ASTELIN) 0.1 % nasal spray Place 2 sprays into both nostrils 2 (two) times daily. 30 mL 1  . B-D UF III MINI PEN NEEDLES 31G X 5 MM MISC USE 6 TIMES DAILY AS DIRECTED 540 each 2  . cromolyn (OPTICROM) 4 % ophthalmic solution Place 1 drop into both eyes as needed (allergies).     . cyanocobalamin (,VITAMIN B-12,) 1000 MCG/ML injection Inject 1 mL (1,000 mcg total) into the muscle once. 1 mL 5  . Dulaglutide (TRULICITY) 6.25 WL/8.9HT SOPN Inject 1.5 mg weekly under skin 12 pen 2  . Esomeprazole Magnesium (NEXIUM 24HR PO) Take 20 mg by mouth daily.    . furosemide (LASIX) 40 MG tablet Take 1 tablet (40 mg total) by mouth daily. 90 tablet 3  . glucose blood test strip Use as instructed to test blood sugar four times per day. DX E11.09 500 each 1  . inFLIXimab (REMICADE) 100 MG injection Inject into the vein every 8 (eight) weeks.     . insulin aspart (NOVOLOG) 100 unit/mL injection Inject 100 Units into the skin 3 (three) times daily before meals.    . Insulin Detemir (LEVEMIR) 100 UNIT/ML Pen Inject 70 Units into the skin at bedtime. 60 pen 1  . Insulin Pen Needle (PEN NEEDLES 31GX5/16") 31G X 8 MM MISC Use pen needles with insulin pens to inject  insulin as directed. DX E11.09 600 each 1  . lactase (LACTAID) 3000 UNITS tablet Take 3,000 Units by mouth as needed (when eating foods containing dairy).     Marland Kitchen loperamide (IMODIUM) 2 MG capsule Take 2 mg by mouth 3 (three) times daily as needed for diarrhea or loose stools.     . meclizine (ANTIVERT) 25 MG tablet Take 1 tablet (25 mg total) by mouth 3 (three) times daily as needed for dizziness or nausea. 90 tablet 0  . metoprolol succinate (TOPROL-XL) 100 MG 24 hr tablet Take 1 tablet (100 mg total) by mouth 2 (two) times daily. Take with or immediately following a meal. 90 tablet 1  . ONETOUCH DELICA LANCETS 34K MISC PATIENT USES ONE TOUCH DELICAL 87G LANCETS. Use as directed to check blood sugar up to 6 times per day. DX: E11.09 500 each 3  . potassium chloride (KLOR-CON M10) 10 MEQ tablet Take 2 tablets (20 mEq total) by mouth daily. 180 tablet 3  . rivaroxaban (XARELTO) 20 MG TABS tablet Take 1 tablet (20 mg total) by mouth daily with supper. (Patient taking differently: Take 20 mg by mouth daily with supper. ) 90 tablet 1  . Syringe/Needle, Disp, (SYRINGE 3CC/22GX1") 22G X 1" 3 ML MISC Use to inject Vit B12 once monthly. 25 each 0  . [DISCONTINUED] amitriptyline (ELAVIL) 25 MG tablet Take 1 tablet (25 mg total) by mouth at bedtime. start with 1/2 tab at night for 1 week then increase to 1 whole tab. 30 tablet 3  . [DISCONTINUED] bromocriptine (PARLODEL) 2.5 MG tablet Take 2.5 mg by mouth 2 (two) times daily.      . [DISCONTINUED] potassium chloride (K-DUR) 10 MEQ tablet Take 2 tablets (20 mEq total) by mouth daily. 90 tablet 1   No current facility-administered medications on file prior to visit.   Allergies  Allergen Reactions  . Actos [Pioglitazone] Other (See Comments)    EDEMA   . Benzocaine-Menthol Swelling    SWELLING OF MOUTH  . Colesevelam Other (See Comments)  GI UPSET  . Flagyl [Metronidazole Hcl] Other (See Comments)    DIAPHORESIS  . Metformin And Related Diarrhea  .  Omeprazole Swelling    SWELLING OF TONGUE AND THROAT  . Shrimp [Shellfish Allergy] Itching    OF THROAT AND EARS  . Statins Other (See Comments)    HEART RACING  . Desipramine Hcl Itching, Nausea Only and Other (See Comments)    "swimmy" headed, ears itched   . Hydromorphone Itching  . Jardiance [Empagliflozin] Other (See Comments)    weakness  . Adhesive [Tape] Other (See Comments)    SKIN IRRITATION AND BRUISING  . Nisoldipine Itching  . Percocet [Oxycodone-Acetaminophen] Itching   Family History  Problem Relation Age of Onset  . Diabetes Mother   . Hypertension Mother   . Heart attack Father     Mid 10's  . Heart disease Father   . Lung disease Father     spot on lung; had lung surgery  . Alcohol abuse Other   . Hypertension Son   . Diabetes Son   . Neuropathy Neg Hx   . Emphysema Maternal Grandfather   . Asthma Maternal Grandfather   . Colon cancer Paternal Grandfather   . Stomach cancer Paternal Grandmother    PE: BP 130/88 mmHg  Pulse 72  Ht 5' 2"  (1.575 m)  Wt 217 lb (98.431 kg)  BMI 39.68 kg/m2  SpO2 94% Wt Readings from Last 3 Encounters:  04/16/16 217 lb (98.431 kg)  04/09/16 216 lb (97.977 kg)  03/19/16 220 lb (99.791 kg)   Constitutional: overweight, in NAD Eyes: PERRLA, EOMI, no exophthalmos ENT: moist mucous membranes, no thyromegaly, no cervical lymphadenopathy Cardiovascular: RRR, No MRG Respiratory: CTA B Gastrointestinal: abdomen soft, NT, ND, BS+ Musculoskeletal: no deformities, strength intact in all 4 Skin: moist, warm, no rashes Neurological: no tremor with outstretched hands, DTR normal in all 4  ASSESSMENT: 1. DM2, insulin-dependent, uncontrolled, with complications - PN  2. B12 def PLAN:  1. Patient with long-standing, uncontrolled diabetes, on basal-bolus insulin regimen, with a large preponderence of short acting vs long acting insulin. We started to decrease the Novolog at last visit, but she could not tolerate the Metformin  ER and the SGLT2 inh >> sugars are higher and more variable now >> will switch to U500 insulin and continue Trulicity - I suggested to:  Patient Instructions  Please stop Levemir and Novolog and start U500 insulin 15-30 min before a meal: - 60 units before a smaller meal - 80 units before a regular meal - 100 units before a larger meal  Continue: - Trulicity 5.36 mg weekly.  Please return in 1.5 months with your sugar log.   - continue checking sugars at different times of the day - check 3 times a day, rotating checks - advised for yearly eye exams >> she needs one >> coming up - Return to clinic in 1.5 mo with sugar log   2. B12 def - found to have B12 def at last visit >> started B12 inj  - will give her the second dose today

## 2016-04-16 NOTE — Patient Instructions (Signed)
Please stop Levemir and Novolog and start U500 insulin 15-30 min before a meal: - 60 units before a smaller meal - 80 units before a regular meal - 100 units before a larger meal  Continue: - Trulicity 9.67 mg weekly.  Please return in 1.5 months with your sugar log.

## 2016-04-16 NOTE — Addendum Note (Signed)
Addended by: Caprice Beaver T on: 04/16/2016 02:39 PM   Modules accepted: Orders

## 2016-04-22 ENCOUNTER — Encounter: Payer: Self-pay | Admitting: Adult Health

## 2016-04-23 ENCOUNTER — Ambulatory Visit: Payer: Medicare Other | Admitting: Pulmonary Disease

## 2016-04-26 ENCOUNTER — Telehealth: Payer: Self-pay | Admitting: Internal Medicine

## 2016-04-26 NOTE — Telephone Encounter (Signed)
Spoke to pt. Symptoms started the next morning after starting the humulin a week ago. She has not tried taking benadryl. She will try taking some while she waits for a call back.

## 2016-04-26 NOTE — Telephone Encounter (Signed)
Let's try to send Novolin - same doses, and list Humulin as a allergy.

## 2016-04-26 NOTE — Telephone Encounter (Signed)
Patient wanted doctor to know that she is having problems with the Humulin.  Her tongue and lips feels thick and tingles.  Please advise.

## 2016-04-29 ENCOUNTER — Telehealth: Payer: Self-pay

## 2016-04-29 ENCOUNTER — Other Ambulatory Visit: Payer: Self-pay

## 2016-04-29 MED ORDER — INSULIN REGULAR HUMAN 100 UNIT/ML IJ SOLN: INTRAMUSCULAR | 3 refills | Status: DC

## 2016-04-29 NOTE — Telephone Encounter (Signed)
[facility] called, need clarification on insulin script

## 2016-04-29 NOTE — Telephone Encounter (Signed)
Novolin R comes in the vial; what size needles would you like for me to send in for the syringes?

## 2016-04-29 NOTE — Telephone Encounter (Signed)
The thinnest once, but they have to be at least a 69m.

## 2016-04-30 ENCOUNTER — Other Ambulatory Visit: Payer: Self-pay

## 2016-04-30 MED ORDER — INSULIN ASPART 100 UNIT/ML IV SOLN: INTRAVENOUS | 0 refills | Status: DC

## 2016-04-30 MED ORDER — INSULIN DETEMIR 100 UNIT/ML FLEXPEN: PEN_INJECTOR | SUBCUTANEOUS | 1 refills | Status: DC

## 2016-04-30 NOTE — Telephone Encounter (Signed)
Messaged Dr.Gherghe of issues with Novolin R medication and advised on how to proceed.

## 2016-04-30 NOTE — Telephone Encounter (Signed)
Actually, [name], let's have her go back to Levemir and Novolog as before last visit (please see first part of my last note), as she had the reaction to U500 insulin. No Novolin for now.

## 2016-04-30 NOTE — Telephone Encounter (Signed)
Sent into pharmacy the old rx for novolog and levemir per MD note. Levemir inject 60 units into the skin at bedtime, and novolog inject 130 units daily before meals and 60 units at bedtime. Sent to pharmacy.

## 2016-05-01 ENCOUNTER — Other Ambulatory Visit: Payer: Self-pay

## 2016-05-01 MED ORDER — INSULIN ASPART 100 UNIT/ML IV SOLN: INTRAVENOUS | 1 refills | Status: DC

## 2016-05-01 NOTE — Telephone Encounter (Signed)
[facility] calling to get a resend of the novolog rx there is not a high enough quantity

## 2016-05-01 NOTE — Telephone Encounter (Signed)
Resending novolog to pharmacy.

## 2016-05-01 NOTE — Telephone Encounter (Signed)
humuln r u 500 please be sure this is put in as an allergy

## 2016-05-06 ENCOUNTER — Other Ambulatory Visit: Payer: Self-pay | Admitting: Internal Medicine

## 2016-05-07 ENCOUNTER — Other Ambulatory Visit: Payer: Self-pay

## 2016-05-07 MED ORDER — INSULIN DETEMIR 100 UNIT/ML FLEXPEN: PEN_INJECTOR | SUBCUTANEOUS | 1 refills | Status: DC

## 2016-05-07 NOTE — Telephone Encounter (Signed)
I sent in Rx for levemir [facility]. I sent the Novolog in through [facility] [place], I will refax it over [facility].

## 2016-05-10 ENCOUNTER — Other Ambulatory Visit: Payer: Self-pay

## 2016-05-10 MED ORDER — INSULIN ASPART 100 UNIT/ML FLEXPEN: PEN_INJECTOR | SUBCUTANEOUS | 1 refills | Status: DC

## 2016-05-14 ENCOUNTER — Encounter (HOSPITAL_COMMUNITY)
Admission: RE | Admit: 2016-05-14 | Discharge: 2016-05-14 | Disposition: A | Discharge status: Home / Self Care | Payer: Medicare Other | Source: Ambulatory Visit | Attending: Internal Medicine | Admitting: Internal Medicine

## 2016-05-14 DIAGNOSIS — K5190 Ulcerative colitis, unspecified, without complications: Secondary | ICD-10-CM | POA: Insufficient documentation

## 2016-05-23 ENCOUNTER — Encounter (HOSPITAL_COMMUNITY): Payer: Self-pay

## 2016-05-23 ENCOUNTER — Encounter (HOSPITAL_COMMUNITY)
Admission: RE | Admit: 2016-05-23 | Discharge: 2016-05-23 | Disposition: A | Discharge status: Home / Self Care | Payer: Medicare Other | Source: Ambulatory Visit | Attending: Internal Medicine | Admitting: Internal Medicine

## 2016-05-23 DIAGNOSIS — K5190 Ulcerative colitis, unspecified, without complications: Secondary | ICD-10-CM | POA: Diagnosis not present

## 2016-05-23 MED ORDER — ACETAMINOPHEN 325 MG PO TABS
650.0000 mg | ORAL_TABLET | Freq: Once | ORAL | Status: AC
  Administered 2016-05-23: 650 mg via ORAL
  Filled 2016-05-23: qty 2

## 2016-05-23 MED ORDER — SODIUM CHLORIDE 0.9 % IV SOLN
500.0000 mg | INTRAVENOUS | Status: DC
  Administered 2016-05-23: 500 mg via INTRAVENOUS
  Filled 2016-05-23: qty 10

## 2016-05-23 MED ORDER — SODIUM CHLORIDE 0.9 % IV SOLN
Freq: Once | INTRAVENOUS | Status: AC
  Administered 2016-05-23: 12:00:00 via INTRAVENOUS

## 2016-05-23 MED ORDER — LORATADINE 10 MG PO TABS
10.0000 mg | ORAL_TABLET | Freq: Once | ORAL | Status: AC
  Administered 2016-05-23: 10 mg via ORAL
  Filled 2016-05-23: qty 1

## 2016-05-24 ENCOUNTER — Other Ambulatory Visit: Payer: Self-pay | Admitting: Internal Medicine

## 2016-05-31 ENCOUNTER — Other Ambulatory Visit: Payer: Self-pay

## 2016-05-31 ENCOUNTER — Telehealth: Payer: Self-pay | Admitting: *Deleted

## 2016-05-31 MED ORDER — INSULIN PEN NEEDLE 31G X 5 MM MISC: 2 refills | Status: DC

## 2016-05-31 NOTE — Telephone Encounter (Signed)
Received fax from [facility] requesting refill on pt BD UF Mini Pen needles use 6 x's a day. Pt see Dr. Cruzita Lederer for her diabetes forwardig msg to endo for refill....[name]

## 2016-05-31 NOTE — Telephone Encounter (Signed)
OK to refill

## 2016-06-04 ENCOUNTER — Encounter: Payer: Self-pay | Admitting: Cardiology

## 2016-06-05 NOTE — Progress Notes (Signed)
HPI: FU atrial fibrillation. Stress echocardiogram performed in September of 2011 was interpreted as normal. There was mention that LV function may not have improved as much as expected and if clinical suspicion high consider alternative imaging. Lexiscan Myoview in November of 2011 showed normal perfusion. Study not gated. CardioNet showed sinus with PVCs. Had EGD in November 2016 and developed atrial fibrillation. Placed on amiodarone and xarelto. Echocardiogram November 2016 showed normal LV function, moderate left ventricular hypertrophy, grade 1 diastolic dysfunction and mild left atrial enlargement. TSH November 2016 normal. Amiodarone DCed at last ov. Since she was last seen, Patient denies dyspnea, chest pain, palpitations or syncope. She does have occasional nosebleeds. She notes nausea intermittently and feels this may be related to xarelto.   Current Outpatient Prescriptions  Medication Sig Dispense Refill  . azelastine (ASTELIN) 0.1 % nasal spray Place 2 sprays into both nostrils 2 (two) times daily. 30 mL 1  . Blood Glucose Monitoring Suppl (ONE TOUCH ULTRA MINI) w/Device KIT Use to check sugars, checks 4x daily. 1 each 0  . cromolyn (OPTICROM) 4 % ophthalmic solution Place 1 drop into both eyes as needed (allergies).     . cyanocobalamin (,VITAMIN B-12,) 1000 MCG/ML injection Inject 1 mL (1,000 mcg total) into the muscle once. 1 mL 5  . Dulaglutide (TRULICITY) 1.5 QR/9.7JO SOPN Inject under skin 1.5 mg weekly under skin. 12 pen 3  . Esomeprazole Magnesium (NEXIUM 24HR PO) Take 20 mg by mouth daily.    . furosemide (LASIX) 40 MG tablet Take 1 tablet (40 mg total) by mouth daily. 90 tablet 3  . glucose blood test strip Use as instructed to test blood sugar four times per day. DX E11.09 500 each 1  . inFLIXimab (REMICADE) 100 MG injection Inject into the vein every 8 (eight) weeks.     . insulin aspart (NOVOLOG) 100 UNIT/ML FlexPen 130 units 3 times per and 60 units at bedtime. 135  pen 1  . Insulin Detemir (LEVEMIR) 100 UNIT/ML Pen Inject 70 Units into the skin daily at 10 pm. 10 pen 3  . Insulin Pen Needle (B-D UF III MINI PEN NEEDLES) 31G X 5 MM MISC USE 6 TIMES DAILY AS DIRECTED 540 each 2  . Insulin Pen Needle (CAREFINE PEN NEEDLES) 32G X 4 MM MISC Use 3x a day 100 each 11  . lactase (LACTAID) 3000 UNITS tablet Take 3,000 Units by mouth as needed (when eating foods containing dairy).     Marland Kitchen loperamide (IMODIUM) 2 MG capsule Take 2 mg by mouth 3 (three) times daily as needed for diarrhea or loose stools.     . meclizine (ANTIVERT) 25 MG tablet Take 1 tablet (25 mg total) by mouth 3 (three) times daily as needed for dizziness or nausea. 90 tablet 0  . metoprolol succinate (TOPROL-XL) 100 MG 24 hr tablet Take 1 tablet (100 mg total) by mouth 2 (two) times daily. Take with or immediately following a meal. 90 tablet 1  . ONETOUCH DELICA LANCETS 83G MISC PATIENT USES ONE TOUCH DELICAL 54D LANCETS. Use as directed to check blood sugar up to 6 times per day. DX: E11.09 500 each 3  . potassium chloride (KLOR-CON M10) 10 MEQ tablet Take 2 tablets (20 mEq total) by mouth daily. 180 tablet 3  . rivaroxaban (XARELTO) 20 MG TABS tablet Take 1 tablet (20 mg total) by mouth daily with supper. (Patient taking differently: Take 20 mg by mouth daily with supper. ) 90 tablet 1  .  Syringe/Needle, Disp, (SYRINGE 3CC/22GX1") 22G X 1" 3 ML MISC Use to inject Vit B12 once monthly. 25 each 0   No current facility-administered medications for this visit.      Past Medical History:  Diagnosis Date  . Anxiety   . 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 Complication of anesthesia   . Dental bridge present    lower  . GERD   . History of thrombocytopenia 12/2011  . HYPERTENSION   . IDDM (insulin dependent diabetes mellitus) ([place])   . OSA (obstructive sleep apnea) 09/14/2015   sleep study 09/07/15: severe obstructive sleep apnea with an AHI of 72 and SaO2 low of 75%.>refer  to sleep med for eval and tx options  . Osteoarthritis   . Ulcerative colitis ([place])    Remicade infusion Q 8 weeks    Past Surgical History:  Procedure Laterality Date  . ABDOMINAL HYSTERECTOMY  1980   partial  . BACTERIAL OVERGROWTH TEST N/A 07/13/2015   Procedure: BACTERIAL OVERGROWTH TEST;  Surgeon: Rogene Houston, MD;  [facility];  Service: Endoscopy;  Laterality: N/A;  730    . BILATERAL SALPINGOOPHORECTOMY  02/10/2001  . BREAST REDUCTION SURGERY  1994  . CARPAL TUNNEL RELEASE Right 1996  . CARPAL TUNNEL RELEASE Left 03/21/2003  . CARPAL TUNNEL RELEASE Right 05/04/2013   Procedure: CARPAL TUNNEL RELEASE;  Surgeon: Cammie Sickle., MD;  Location: [place];  Service: Orthopedics;  Laterality: Right;  . CARPAL TUNNEL RELEASE Left 09/21/2013   Procedure: LEFT CARPAL TUNNEL RELEASE;  Surgeon: Cammie Sickle., MD;  Location: [place];  Service: Orthopedics;  Laterality: Left;  . CHOLECYSTECTOMY    . COLONOSCOPY WITH PROPOFOL N/A 08/04/2015   Procedure: COLONOSCOPY WITH PROPOFOL;  Surgeon: Rogene Houston, MD;  [facility];  Service: Endoscopy;  Laterality: N/A;  cecum time in  0820   time out  0827    total time 7 minutes  . ESOPHAGEAL DILATION N/A 08/04/2015   Procedure: ESOPHAGEAL DILATION;  Surgeon: Rogene Houston, MD;  [facility];  Service: Endoscopy;  Laterality: N/A;  Maloney 56, no mucousal disruption  . ESOPHAGOGASTRODUODENOSCOPY (EGD) WITH ESOPHAGEAL DILATION  12/02/2005  . ESOPHAGOGASTRODUODENOSCOPY (EGD) WITH PROPOFOL N/A 08/04/2015   Procedure: ESOPHAGOGASTRODUODENOSCOPY (EGD) WITH PROPOFOL;  Surgeon: Rogene Houston, MD;  [facility];  Service: Endoscopy;  Laterality: N/A;  procedure 1  . FLEXIBLE SIGMOIDOSCOPY  01/17/2012   Procedure: FLEXIBLE SIGMOIDOSCOPY;  Surgeon: Rogene Houston, MD;  [facility];  Service: Endoscopy;  Laterality: N/A;  . HEMILAMINOTOMY LUMBAR SPINE Bilateral 09/07/1999   L4-5   . KNEE ARTHROSCOPY Right 01/1999; 10/2000  . LYSIS OF ADHESION  02/10/2001  . [place]; 09/12/2006  . TARSAL TUNNEL RELEASE  2002  . TUMOR EXCISION Left 03/21/2003   dorsal 1st web space (hand)  . URETEROLYSIS Right 02/10/2001    Social History   Social History  . Marital status: Married    Spouse name: N/A  . Number of children: N/A  . Years of education: N/A   Occupational History  . Retired    Social History Main Topics  . Smoking status: Never Smoker  . Smokeless tobacco: Never Used  . Alcohol use 0.0 oz/week     Comment: very rare  . Drug use: No  . Sexual activity: Yes    Partners: Male    Birth control/ protection: Surgical  Comment: hysterectomy   Other Topics Concern  . Not on file   Social History Narrative   She lives with husband in two-story home.  They have one grown son and 2 grandchildren.   She is retired [employer].   Highest of level education:  Some college    Family History  Problem Relation Age of Onset  . Diabetes Mother   . Hypertension Mother   . Heart attack Father     Mid 73's  . Heart disease Father   . Lung disease Father     spot on lung; had lung surgery  . Alcohol abuse Other   . Hypertension Son   . Diabetes Son   . Emphysema Maternal Grandfather   . Asthma Maternal Grandfather   . Colon cancer Paternal Grandfather   . Stomach cancer Paternal Grandmother   . Neuropathy Neg Hx     ROS: no fevers or chills, productive cough, hemoptysis, dysphasia, odynophagia, melena, hematochezia, dysuria, hematuria, rash, seizure activity, orthopnea, PND, pedal edema, claudication. Remaining systems are negative.  Physical Exam: Well-developed obese in no acute distress.  Skin is warm and dry.  HEENT is normal.  Neck is supple.  Chest is clear to auscultation with normal expansion.  Cardiovascular exam is regular rate and rhythm.  Abdominal exam nontender or distended. No masses palpated. Extremities show no  edema. neuro grossly intact  ECG-Sinus rhythm at a rate of 76. Prior anterior infarct.  A/P  1 Paroxysmal atrial fibrillation-the patient remains in sinus rhythm.Continue beta blocker. She is having occasional nosebleeds but these do not appear to be significant to the point that we would discontinue anticoagulation. Continue xarelto. Check hemoglobin and renal function.  2 hypertension-blood pressure controlled. Continue present medications.  3 hyperlipidemia-Managed by primary care.  Kirk Ruths, MD

## 2016-06-06 ENCOUNTER — Other Ambulatory Visit: Payer: Self-pay

## 2016-06-06 ENCOUNTER — Ambulatory Visit (INDEPENDENT_AMBULATORY_CARE_PROVIDER_SITE_OTHER): Payer: Medicare Other | Admitting: Internal Medicine

## 2016-06-06 ENCOUNTER — Encounter: Payer: Self-pay | Admitting: Cardiology

## 2016-06-06 ENCOUNTER — Encounter: Payer: Self-pay | Admitting: Internal Medicine

## 2016-06-06 ENCOUNTER — Ambulatory Visit (INDEPENDENT_AMBULATORY_CARE_PROVIDER_SITE_OTHER): Payer: Medicare Other | Admitting: Cardiology

## 2016-06-06 VITALS — BP 128/76 | HR 76 | Ht 62.0 in | Wt 221.0 lb

## 2016-06-06 VITALS — BP 128/82 | HR 72 | Ht 61.5 in | Wt 219.0 lb

## 2016-06-06 DIAGNOSIS — E1142 Type 2 diabetes mellitus with diabetic polyneuropathy: Secondary | ICD-10-CM | POA: Diagnosis not present

## 2016-06-06 DIAGNOSIS — I4891 Unspecified atrial fibrillation: Secondary | ICD-10-CM | POA: Diagnosis not present

## 2016-06-06 DIAGNOSIS — I10 Essential (primary) hypertension: Secondary | ICD-10-CM

## 2016-06-06 DIAGNOSIS — G6289 Other specified polyneuropathies: Secondary | ICD-10-CM | POA: Diagnosis not present

## 2016-06-06 DIAGNOSIS — Z79899 Other long term (current) drug therapy: Secondary | ICD-10-CM | POA: Diagnosis not present

## 2016-06-06 DIAGNOSIS — E1165 Type 2 diabetes mellitus with hyperglycemia: Secondary | ICD-10-CM

## 2016-06-06 DIAGNOSIS — E785 Hyperlipidemia, unspecified: Secondary | ICD-10-CM | POA: Diagnosis not present

## 2016-06-06 DIAGNOSIS — IMO0002 Reserved for concepts with insufficient information to code with codable children: Secondary | ICD-10-CM

## 2016-06-06 LAB — POCT GLYCOSYLATED HEMOGLOBIN (HGB A1C): Hemoglobin A1C: 6.5

## 2016-06-06 MED ORDER — INSULIN DETEMIR 100 UNIT/ML FLEXPEN: 70.0000 [IU] | PEN_INJECTOR | Freq: Every day | SUBCUTANEOUS | 3 refills | Status: DC

## 2016-06-06 MED ORDER — ONETOUCH DELICA LANCETS 33G MISC: 3 refills | Status: DC

## 2016-06-06 MED ORDER — ONETOUCH ULTRA MINI W/DEVICE KIT: PACK | 0 refills | Status: DC

## 2016-06-06 MED ORDER — INSULIN PEN NEEDLE 32G X 4 MM MISC: 11 refills | Status: DC

## 2016-06-06 MED ORDER — RIVAROXABAN 20 MG PO TABS: 20.0000 mg | ORAL_TABLET | Freq: Every day | ORAL | 3 refills | Status: DC

## 2016-06-06 MED ORDER — DULAGLUTIDE 1.5 MG/0.5ML ~~LOC~~ SOAJ: SUBCUTANEOUS | 3 refills | Status: DC

## 2016-06-06 MED ORDER — APIXABAN 5 MG PO TABS: 5.0000 mg | ORAL_TABLET | Freq: Two times a day (BID) | ORAL | 3 refills | Status: DC

## 2016-06-06 NOTE — Progress Notes (Signed)
Patient ID: [name], female   DOB: 1946/04/14, 70 y.o.   MRN: [id number]  HPI: [name] is a 70 y.o.-year-old female,returning for f/u for DM2, dx in 2010, insulin-dependent since 2010, uncontrolled, with complications (peripheral neuropathy). Last visit 2 mo ago. She saw Dr. Loanne Drilling before (up to 2015). She has [place].   Last hemoglobin A1c was: Lab Results  Component Value Date   HGBA1C 7.0 02/13/2016   HGBA1C 6.2 11/14/2015   HGBA1C 6.6 (H) 08/29/2015   Pt was on a regimen of: - Levemir 60 units at bedtime - Novolog 130 units 3x a day, before meals + 60 units at bedtime She was on Metformin >> caused her UC (diarrhea) to be worse.  She is on: - Levemir 70 units at bedtime. - Novolog doses as follows:100 units 3x a day - Trulicity 8.12 mg injection once a week She was also Metformin ER 500 mg 2x a day with meal >> diarrhea She stopped Jardiance 25 mg daily in am >> called: weak legged, nauseated, swimmy headed, and trembly  We tried U500 insulin >> could not tolerate it >> tingling in tongue, mild dysphagia - not with every dose.  Pt checks her sugars 2-3x a day and they are: - am: 82-226, 257 >> 73 x1 at 4 am, 155-309 >> 141-250 >> 144-270 - 2h after b'fast: n/c >> n.c >> 170 - before lunch: 120-206 >> 110-214 >> 86-137 >> 119-160, 195 - 2h after lunch: n/c >> 146, 353 >> 387 >> n/c - before dinner: 56, 92-158 >> 86, 98-189 >> 102, 167-193 >> 85-127, 165 - 2h after dinner: n/c >> 68, 85-124 - bedtime: 93-248 >> 105-233, 372 >> 108-305 >> 86, 96-247 - nighttime: 65-69 >> n/c Lowest sugar was 100 >> 68; she has hypoglycemia awareness at 90s.  Highest sugar was 268 >> 300s (steroids) >> 247.  Glucometer: One Touch Ultra Mini  Pt's meals are: - Breakfast: sausage, toast, eggs - Lunch: sandwich, chips or fruit - Dinner: chicken, potatoes, salad - Snacks: fruit  - no CKD, last BUN/creatinine:  Lab Results  Component Value Date   BUN 16 11/03/2015    CREATININE 0.95 11/03/2015   - last set of lipids: Lab Results  Component Value Date   CHOL 230 (H) 08/29/2015   HDL 42.90 08/29/2015   LDLCALC 153 (H) 08/29/2015   LDLDIRECT 69.5 01/24/2011   TRIG 175.0 (H) 08/29/2015   CHOLHDL 5 08/29/2015   - last eye exam was in 03/2015. No DR. Dr. [name] Fitch. - + numbness and tingling in her feet. + small fiber neuropathy and fibromyalgia. On Neurontin.   She sees Dr Stanford Breed for A fib. On Xarelto >> sweating, nausea, tinnitus.  At last visit, she was found to have B12 def >> started inj of B12  ROS: Constitutional: no weight gain, + fatigue, no hot flushes Eyes: no blurry vision, no xerophthalmia ENT: no sore throat, no nodules palpated in throat, no dysphagia/no odynophagia, + hoarseness Cardiovascular: no CP/SOB/no palpitations/leg swelling Respiratory: no cough/SOB Gastrointestinal:no N/V/D/C, no heartburn Musculoskeletal: no muscle aches/joint aches Skin: no rashes Neurological: no tremors/numbness/tingling/dizziness, no HA, + neuropathic pain in hands and feet  I reviewed pt's medications, allergies, PMH, social hx, family hx, and changes were documented in the history of present illness. Otherwise, unchanged from my initial visit note.  Past Medical History:  Diagnosis Date  . Anxiety   . 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 Complication  of anesthesia   . Dental bridge present    lower  . GERD   . History of thrombocytopenia 12/2011  . HYPERTENSION   . IDDM (insulin dependent diabetes mellitus) ([place])   . OSA (obstructive sleep apnea) 09/14/2015   sleep study 09/07/15: severe obstructive sleep apnea with an AHI of 72 and SaO2 low of 75%.>refer to sleep med for eval and tx options  . Osteoarthritis   . Ulcerative colitis ([place])    Remicade infusion Q 8 weeks   Past Surgical History:  Procedure Laterality Date  . ABDOMINAL HYSTERECTOMY  1980   partial  . BACTERIAL OVERGROWTH TEST N/A 07/13/2015    Procedure: BACTERIAL OVERGROWTH TEST;  Surgeon: Rogene Houston, MD;  [facility];  Service: Endoscopy;  Laterality: N/A;  730    . BILATERAL SALPINGOOPHORECTOMY  02/10/2001  . BREAST REDUCTION SURGERY  1994  . CARPAL TUNNEL RELEASE Right 1996  . CARPAL TUNNEL RELEASE Left 03/21/2003  . CARPAL TUNNEL RELEASE Right 05/04/2013   Procedure: CARPAL TUNNEL RELEASE;  Surgeon: Cammie Sickle., MD;  Location: [facility];  Service: Orthopedics;  Laterality: Right;  . CARPAL TUNNEL RELEASE Left 09/21/2013   Procedure: LEFT CARPAL TUNNEL RELEASE;  Surgeon: Cammie Sickle., MD;  Location: [facility];  Service: Orthopedics;  Laterality: Left;  . CHOLECYSTECTOMY    . COLONOSCOPY WITH PROPOFOL N/A 08/04/2015   Procedure: COLONOSCOPY WITH PROPOFOL;  Surgeon: Rogene Houston, MD;  Location: [facility];  Service: Endoscopy;  Laterality: N/A;  cecum time in  0820   time out  0827    total time 7 minutes  . ESOPHAGEAL DILATION N/A 08/04/2015   Procedure: ESOPHAGEAL DILATION;  Surgeon: Rogene Houston, MD;  Location: [facility];  Service: Endoscopy;  Laterality: N/A;  [place] 57, no mucousal disruption  . ESOPHAGOGASTRODUODENOSCOPY (EGD) WITH ESOPHAGEAL DILATION  12/02/2005  . ESOPHAGOGASTRODUODENOSCOPY (EGD) WITH PROPOFOL N/A 08/04/2015   Procedure: ESOPHAGOGASTRODUODENOSCOPY (EGD) WITH PROPOFOL;  Surgeon: Rogene Houston, MD;  [facility];  Service: Endoscopy;  Laterality: N/A;  procedure 1  . FLEXIBLE SIGMOIDOSCOPY  01/17/2012   Procedure: FLEXIBLE SIGMOIDOSCOPY;  Surgeon: Rogene Houston, MD;  [facility];  Service: Endoscopy;  Laterality: N/A;  . HEMILAMINOTOMY LUMBAR SPINE Bilateral 09/07/1999   L4-5  . KNEE ARTHROSCOPY Right 01/1999; 10/2000  . LYSIS OF ADHESION  02/10/2001  . [place]; 09/12/2006  . TARSAL TUNNEL RELEASE  2002  . TUMOR EXCISION Left 03/21/2003   dorsal 1st web space (hand)  . URETEROLYSIS Right 02/10/2001   Social History    Social History  . Marital Status: Married    Spouse Name: N/A  . Number of Children: 1   Occupational History  . 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 Smoking status: Never Smoker   . Smokeless tobacco: Never Used  . Alcohol Use: 0.0 oz/week    0 Standard drinks or equivalent per week     Comment: very rare  . Drug Use: No  . Sexual Activity:    Partners: Male   Social History Narrative   She lives with husband in two-story home.  They have one grown son and 2 grandchildren.   She is retired [employer].   Highest of level education:  college   Current Outpatient Prescriptions on File Prior to Visit  Medication Sig Dispense Refill  . azelastine (ASTELIN) 0.1 % nasal spray Place 2 sprays into  both nostrils 2 (two) times daily. 30 mL 1  . cromolyn (OPTICROM) 4 % ophthalmic solution Place 1 drop into both eyes as needed (allergies).     . cyanocobalamin (,VITAMIN B-12,) 1000 MCG/ML injection Inject 1 mL (1,000 mcg total) into the muscle once. 1 mL 5  . Dulaglutide (TRULICITY) 4.19 QQ/2.2LN SOPN Inject 0.75 mg weekly under skin 4 pen 2  . Esomeprazole Magnesium (NEXIUM 24HR PO) Take 20 mg by mouth daily.    . furosemide (LASIX) 40 MG tablet Take 1 tablet (40 mg total) by mouth daily. 90 tablet 3  . glucose blood test strip Use as instructed to test blood sugar four times per day. DX E11.09 500 each 1  . inFLIXimab (REMICADE) 100 MG injection Inject into the vein every 8 (eight) weeks.     . insulin aspart (NOVOLOG) 100 UNIT/ML FlexPen 130 units 3 times per and 60 units at bedtime. 135 pen 1  . Insulin Detemir (LEVEMIR) 100 UNIT/ML Pen Inject 60 units into the skin at bedtime. (Patient taking differently: 70 Units. Inject 60 units into the skin at bedtime.) 20 pen 1  . Insulin Pen Needle (B-D UF III MINI PEN NEEDLES) 31G X 5 MM MISC USE 6 TIMES DAILY AS DIRECTED 540 each 2  . lactase (LACTAID) 3000 UNITS tablet Take 3,000 Units by mouth as needed (when eating foods  containing dairy).     Marland Kitchen loperamide (IMODIUM) 2 MG capsule Take 2 mg by mouth 3 (three) times daily as needed for diarrhea or loose stools.     . meclizine (ANTIVERT) 25 MG tablet Take 1 tablet (25 mg total) by mouth 3 (three) times daily as needed for dizziness or nausea. 90 tablet 0  . metoprolol succinate (TOPROL-XL) 100 MG 24 hr tablet Take 1 tablet (100 mg total) by mouth 2 (two) times daily. Take with or immediately following a meal. 90 tablet 1  . potassium chloride (KLOR-CON M10) 10 MEQ tablet Take 2 tablets (20 mEq total) by mouth daily. 180 tablet 3  . rivaroxaban (XARELTO) 20 MG TABS tablet Take 1 tablet (20 mg total) by mouth daily with supper. (Patient taking differently: Take 20 mg by mouth daily with supper. ) 90 tablet 1  . Syringe/Needle, Disp, (SYRINGE 3CC/22GX1") 22G X 1" 3 ML MISC Use to inject Vit B12 once monthly. 25 each 0  . [DISCONTINUED] amitriptyline (ELAVIL) 25 MG tablet Take 1 tablet (25 mg total) by mouth at bedtime. start with 1/2 tab at night for 1 week then increase to 1 whole tab. 30 tablet 3  . [DISCONTINUED] bromocriptine (PARLODEL) 2.5 MG tablet Take 2.5 mg by mouth 2 (two) times daily.      . [DISCONTINUED] potassium chloride (K-DUR) 10 MEQ tablet Take 2 tablets (20 mEq total) by mouth daily. 90 tablet 1   No current facility-administered medications on file prior to visit.    Allergies  Allergen Reactions  . Actos [Pioglitazone] Other (See Comments)    EDEMA   . Benzocaine-Menthol Swelling    SWELLING OF MOUTH  . Colesevelam Other (See Comments)    GI UPSET  . Flagyl [Metronidazole Hcl] Other (See Comments)    DIAPHORESIS  . Metformin And Related Diarrhea  . Omeprazole Swelling    SWELLING OF TONGUE AND THROAT  . Shrimp [Shellfish Allergy] Itching    OF THROAT AND EARS  . Statins Other (See Comments)    HEART RACING  . Desipramine Hcl Itching, Nausea Only and Other (See Comments)    "  swimmy" headed, ears itched   . Hydromorphone Itching  .  Jardiance [Empagliflozin] Other (See Comments)    weakness  . Adhesive [Tape] Other (See Comments)    SKIN IRRITATION AND BRUISING  . Nisoldipine Itching  . Percocet [Oxycodone-Acetaminophen] Itching   Family History  Problem Relation Age of Onset  . Diabetes Mother   . Hypertension Mother   . Heart attack Father     Mid 41's  . Heart disease Father   . Lung disease Father     spot on lung; had lung surgery  . Alcohol abuse Other   . Hypertension Son   . Diabetes Son   . Emphysema Maternal Grandfather   . Asthma Maternal Grandfather   . Colon cancer Paternal Grandfather   . Stomach cancer Paternal Grandmother   . Neuropathy Neg Hx    PE: BP 128/82 (BP Location: Left Arm, Patient Position: Sitting)   Pulse 72   Ht 5' 1.5" (1.562 m)   Wt 219 lb (99.3 kg)   SpO2 98%   BMI 40.71 kg/m  Wt Readings from Last 3 Encounters:  06/06/16 219 lb (99.3 kg)  05/23/16 220 lb 12.8 oz (100.2 kg)  04/16/16 217 lb (98.4 kg)   Constitutional: overweight, in NAD Eyes: PERRLA, EOMI, no exophthalmos ENT: moist mucous membranes, no thyromegaly, no cervical lymphadenopathy Cardiovascular: RRR, No MRG Respiratory: CTA B Gastrointestinal: abdomen soft, NT, ND, BS+ Musculoskeletal: no deformities, strength intact in all 4 Skin: moist, warm, no rashes Neurological: no tremor with outstretched hands, DTR normal in all 4  ASSESSMENT: 1. DM2, insulin-dependent, uncontrolled, with complications - PN  2. B12 def  PLAN:  1. Patient with long-standing, uncontrolled diabetes, on basal-bolus insulin regimen, with a large preponderence of short acting vs long acting insulin. We started to decrease the Novolog at a previous visit, but she could not tolerate the Metformin ER and the SGLT2 inh >> sugars increased >> we tried U500 insulin and continued Trulicity >> she developed tongue tingling with U500 >> now back on Levemir, NovoLog high-dose and Trulicity low dose. We will increase Trulicity to target  dose to hopefully eliminate some of her hyperglycemic peaks. - I suggested to: Patient Instructions  Please continue: - Levemir 70 units at bedtime - NovoLog 100 units before a meal  Please increase Trulicity to 1.5 mg weekly.   Please return in 3 months with your sugar log.   - continue checking sugars at different times of the day - check 3 times a day, rotating checks - advised for yearly eye exams >> she is UTD - HbA1c today >> 6.5% (better) - Return to clinic in 3 mo with sugar log   2. B12 def - found to have B12 def at last visit >> started B12 inj   - will recheck at next visit, half-way b/w inj   Philemon Kingdom, MD PhD [facility] Endocrinology

## 2016-06-06 NOTE — Patient Instructions (Addendum)
Medication Instructions:  NO CHANGES.   Labwork: Your physician recommends that you return for lab work in [place], [place]. The lab can be found on the FIRST FLOOR of out building in [place]: Your physician wants you to follow-up in: [place]. You will receive a reminder letter in the mail two months in advance. If you don't receive a letter, please call our office to schedule the follow-up appointment.   If you need a refill on your cardiac medications before your next appointment, please call your pharmacy.

## 2016-06-06 NOTE — Addendum Note (Signed)
Addended by: Caprice Beaver T on: 06/06/2016 01:12 PM   Modules accepted: Orders

## 2016-06-06 NOTE — Patient Instructions (Addendum)
Please continue: - Levemir 70 units at bedtime - NovoLog 100 units before a meal  Please increase Trulicity to 1.5 mg weekly.   Please return in 3 months with your sugar log.

## 2016-06-10 ENCOUNTER — Other Ambulatory Visit: Payer: Self-pay

## 2016-06-10 MED ORDER — INSULIN PEN NEEDLE 31G X 6 MM MISC: 5 refills | Status: DC

## 2016-06-10 NOTE — Telephone Encounter (Signed)
Sent in new rx for pen needles, as the carefree was not covered by insurance.

## 2016-06-12 DIAGNOSIS — H2513 Age-related nuclear cataract, bilateral: Secondary | ICD-10-CM | POA: Diagnosis not present

## 2016-06-12 DIAGNOSIS — E119 Type 2 diabetes mellitus without complications: Secondary | ICD-10-CM | POA: Diagnosis not present

## 2016-06-12 DIAGNOSIS — H04123 Dry eye syndrome of bilateral lacrimal glands: Secondary | ICD-10-CM | POA: Diagnosis not present

## 2016-06-12 LAB — HM DIABETES EYE EXAM

## 2016-07-03 DIAGNOSIS — M7061 Trochanteric bursitis, right hip: Secondary | ICD-10-CM | POA: Diagnosis not present

## 2016-07-03 DIAGNOSIS — M1712 Unilateral primary osteoarthritis, left knee: Secondary | ICD-10-CM | POA: Diagnosis not present

## 2016-07-03 DIAGNOSIS — M1711 Unilateral primary osteoarthritis, right knee: Secondary | ICD-10-CM | POA: Diagnosis not present

## 2016-07-09 ENCOUNTER — Other Ambulatory Visit (HOSPITAL_COMMUNITY): Payer: Medicare Other

## 2016-07-09 ENCOUNTER — Ambulatory Visit (HOSPITAL_COMMUNITY): Payer: Medicare Other | Admitting: Hematology & Oncology

## 2016-07-11 ENCOUNTER — Encounter: Payer: Self-pay | Admitting: Internal Medicine

## 2016-07-23 ENCOUNTER — Encounter (HOSPITAL_COMMUNITY)
Admission: RE | Admit: 2016-07-23 | Discharge: 2016-07-23 | Disposition: A | Discharge status: Home / Self Care | Payer: Medicare Other | Source: Ambulatory Visit | Attending: Internal Medicine | Admitting: Internal Medicine

## 2016-07-23 ENCOUNTER — Other Ambulatory Visit (HOSPITAL_COMMUNITY): Payer: Medicare Other

## 2016-07-23 ENCOUNTER — Encounter (INDEPENDENT_AMBULATORY_CARE_PROVIDER_SITE_OTHER): Payer: Self-pay | Admitting: Internal Medicine

## 2016-07-23 ENCOUNTER — Ambulatory Visit (HOSPITAL_COMMUNITY): Payer: Medicare Other | Admitting: Oncology

## 2016-07-23 ENCOUNTER — Ambulatory Visit (INDEPENDENT_AMBULATORY_CARE_PROVIDER_SITE_OTHER): Payer: Medicare Other | Admitting: Internal Medicine

## 2016-07-23 VITALS — BP 126/72 | HR 76 | Temp 97.2°F | Resp 18 | Ht 61.0 in | Wt 216.3 lb

## 2016-07-23 DIAGNOSIS — K219 Gastro-esophageal reflux disease without esophagitis: Secondary | ICD-10-CM | POA: Diagnosis not present

## 2016-07-23 DIAGNOSIS — K580 Irritable bowel syndrome with diarrhea: Secondary | ICD-10-CM

## 2016-07-23 DIAGNOSIS — K5190 Ulcerative colitis, unspecified, without complications: Secondary | ICD-10-CM | POA: Insufficient documentation

## 2016-07-23 DIAGNOSIS — K5100 Ulcerative (chronic) pancolitis without complications: Secondary | ICD-10-CM

## 2016-07-23 MED ORDER — LORATADINE 10 MG PO TABS
10.0000 mg | ORAL_TABLET | Freq: Once | ORAL | Status: AC
  Administered 2016-07-23: 10 mg via ORAL
  Filled 2016-07-23: qty 1

## 2016-07-23 MED ORDER — SODIUM CHLORIDE 0.9 % IV SOLN
5.0000 mg/kg | Freq: Once | INTRAVENOUS | Status: AC
  Administered 2016-07-23: 500 mg via INTRAVENOUS
  Filled 2016-07-23: qty 50

## 2016-07-23 MED ORDER — ACETAMINOPHEN 325 MG PO TABS
650.0000 mg | ORAL_TABLET | Freq: Once | ORAL | Status: AC
  Administered 2016-07-23: 650 mg via ORAL
  Filled 2016-07-23: qty 2

## 2016-07-23 MED ORDER — DEXLANSOPRAZOLE 60 MG PO CPDR: 60.0000 mg | DELAYED_RELEASE_CAPSULE | Freq: Every day | ORAL | 1 refills | Status: DC

## 2016-07-23 MED ORDER — SODIUM CHLORIDE 0.9 % IV SOLN
Freq: Once | INTRAVENOUS | Status: AC
  Administered 2016-07-23: 250 mL via INTRAVENOUS

## 2016-07-23 NOTE — Patient Instructions (Signed)
Physician will call with results of blood work when completed.

## 2016-07-23 NOTE — Progress Notes (Signed)
Presenting complaint;  Follow-up for ulcerative colitis and GERD.  Subjective:  Patient is 70 year old Caucasian female who is here for scheduled visit. She was last seen on 01/16/2016. She remains on Remicade infusion every 8 weeks for ulcerative colitis. She had dose earlier today. She is having no side effects. She feels she is in remission. She says she os $6000 for this medication which is not covered by her insurance. She generally has 1-3 bowel movements per day. Occasionally she may skip a day. She takes Imodium OTC no more than 10 doses per month. She generally takes this medication when she has to travel. She denies melena or frank rectal bleeding. She may notice blood on her tissue sometimes. She would like for her PPI to be changed. She says it is not cost effective. She states she was recently diagnosed with sleep apnea and is using CPAP and able to sleep much better.   Current Medications: Outpatient Encounter Prescriptions as of 07/23/2016  Medication Sig  . azelastine (ASTELIN) 0.1 % nasal spray Place 2 sprays into both nostrils 2 (two) times daily.  . Blood Glucose Monitoring Suppl (ONE TOUCH ULTRA MINI) w/Device KIT Use to check sugars, checks 4x daily.  . cromolyn (OPTICROM) 4 % ophthalmic solution Place 1 drop into both eyes as needed (allergies).   . cyanocobalamin (,VITAMIN B-12,) 1000 MCG/ML injection Inject 1 mL (1,000 mcg total) into the muscle once.  . Dulaglutide (TRULICITY) 1.5 FW/2.6VZ SOPN Inject under skin 1.5 mg weekly under skin.  . Esomeprazole Magnesium (NEXIUM 24HR PO) Take 20 mg by mouth daily.  . furosemide (LASIX) 40 MG tablet Take 1 tablet (40 mg total) by mouth daily.  Marland Kitchen glucose blood test strip Use as instructed to test blood sugar four times per day. DX E11.09  . inFLIXimab (REMICADE) 100 MG injection Inject into the vein every 8 (eight) weeks.   . insulin aspart (NOVOLOG) 100 UNIT/ML FlexPen 130 units 3 times per and 60 units at bedtime. (Patient  taking differently: 130 units 3 times a day.)  . Insulin Detemir (LEVEMIR) 100 UNIT/ML Pen Inject 70 Units into the skin daily at 10 pm.  . Insulin Pen Needle 31G X 6 MM MISC USE TO CHECK 3 TIMES DAILY  . lactase (LACTAID) 3000 UNITS tablet Take 3,000 Units by mouth as needed (when eating foods containing dairy).   Marland Kitchen loperamide (IMODIUM) 2 MG capsule Take 2 mg by mouth 3 (three) times daily as needed for diarrhea or loose stools.   . meclizine (ANTIVERT) 25 MG tablet Take 1 tablet (25 mg total) by mouth 3 (three) times daily as needed for dizziness or nausea.  . metoprolol succinate (TOPROL-XL) 100 MG 24 hr tablet Take 1 tablet (100 mg total) by mouth 2 (two) times daily. Take with or immediately following a meal.  . nortriptyline (PAMELOR) 10 MG capsule Take 10 mg by mouth at bedtime.  Glory Rosebush DELICA LANCETS 85Y MISC PATIENT USES ONE TOUCH DELICAL 85O LANCETS. Use as directed to check blood sugar up to 6 times per day. DX: E11.09  . potassium chloride (KLOR-CON M10) 10 MEQ tablet Take 2 tablets (20 mEq total) by mouth daily.  . rivaroxaban (XARELTO) 20 MG TABS tablet Take 1 tablet (20 mg total) by mouth daily with supper.  . Syringe/Needle, Disp, (SYRINGE 3CC/22GX1") 22G X 1" 3 ML MISC Use to inject Vit B12 once monthly.   No facility-administered encounter medications on file as of 07/23/2016.      Objective: Blood pressure  126/72, pulse 76, temperature 97.2 F (36.2 C), temperature source Oral, resp. rate 18, height 5' 1"  (1.549 m), weight 216 lb 4.8 oz (98.1 kg). Patient is alert and in no acute distress. Conjunctiva is pink. Sclera is nonicteric Oropharyngeal mucosa is normal. No neck masses or thyromegaly noted. Cardiac exam with regular rhythm normal S1 and S2. No murmur or gallop noted. Lungs are clear to auscultation. Abdomen is full but soft and nontender without organomegaly or masses. No LE edema or clubbing noted.  Labs/studies Results:   lab data from 11/14/15 WBC  9.2, H&H 14.2 and 42.1 and platelet count 237K.  Assessment:  #1. Ulcerative colitis. She remains in remission. Her disease could not be controlled with oral mesalamine. She will continue Remicade infliximab as long that is is working and she is not having any side effects. Will look into alternative preparations as is might be cheaper. #2. GERD. Esomeprazole is working but not cost effective. #3. Irritable bowel syndrome. Diarrhea as well controlled with when necessary use of Imodium.    Plan:  Discontinue esomeprazole. Pantoprazole 40 mg by mouth every morning. Patient will go to the lab for CBC with differential and metabolic 7. Office visit in 6 months.

## 2016-07-24 ENCOUNTER — Encounter (HOSPITAL_COMMUNITY): Payer: Medicare Other

## 2016-07-26 ENCOUNTER — Encounter: Payer: Self-pay | Admitting: Internal Medicine

## 2016-07-31 DIAGNOSIS — M170 Bilateral primary osteoarthritis of knee: Secondary | ICD-10-CM | POA: Diagnosis not present

## 2016-08-05 ENCOUNTER — Telehealth (INDEPENDENT_AMBULATORY_CARE_PROVIDER_SITE_OTHER): Payer: Self-pay | Admitting: *Deleted

## 2016-08-05 NOTE — Telephone Encounter (Signed)
Patient states that she has not tried the samples of the Dexilant that was given to her at [facility] on 07/23/2016. She is going to finish her Nexium first. She says that the [place] will be $144 per month, and she cannot afford that. Patient feels that Pantoprazole and Prevacid are the two that her insurance company will cover. In the [facility] note 07/23/2016 , Dr.Rehman had documented that the patient take Pantoprazole 40 mg every morning. This Rx [facility].  Patient also states that she had rec'd  EOB and it states that the Remicaid was not covered on dates 05/23/2016,03/19/2016. She ask if we knew about the billing. I told her we do not do this and encouraged her to call the number on her statement and talk with them. I told her that I had not heard from the hospital nor preservice center that it was time for another PA to be updated,but that I would follow up on that. She will hear back from me this week.

## 2016-08-07 DIAGNOSIS — M170 Bilateral primary osteoarthritis of knee: Secondary | ICD-10-CM | POA: Diagnosis not present

## 2016-08-13 ENCOUNTER — Encounter (HOSPITAL_COMMUNITY): Payer: Medicare Other

## 2016-08-13 ENCOUNTER — Encounter (HOSPITAL_COMMUNITY): Payer: Medicare Other | Attending: Internal Medicine | Admitting: Hematology & Oncology

## 2016-08-13 ENCOUNTER — Encounter (HOSPITAL_COMMUNITY): Payer: Self-pay | Admitting: Hematology & Oncology

## 2016-08-13 VITALS — BP 140/62 | HR 73 | Temp 98.1°F | Resp 16 | Wt 215.0 lb

## 2016-08-13 DIAGNOSIS — Z85828 Personal history of other malignant neoplasm of skin: Secondary | ICD-10-CM | POA: Diagnosis not present

## 2016-08-13 DIAGNOSIS — C4491 Basal cell carcinoma of skin, unspecified: Secondary | ICD-10-CM

## 2016-08-13 DIAGNOSIS — K5190 Ulcerative colitis, unspecified, without complications: Secondary | ICD-10-CM | POA: Insufficient documentation

## 2016-08-13 DIAGNOSIS — D72820 Lymphocytosis (symptomatic): Secondary | ICD-10-CM

## 2016-08-13 LAB — COMPREHENSIVE METABOLIC PANEL
ALBUMIN: 3.6 g/dL (ref 3.5–5.0)
ALK PHOS: 59 U/L (ref 38–126)
ALT: 59 U/L — AB (ref 14–54)
ANION GAP: 8 (ref 5–15)
AST: 71 U/L — ABNORMAL HIGH (ref 15–41)
BUN: 10 mg/dL (ref 6–20)
CALCIUM: 8.8 mg/dL — AB (ref 8.9–10.3)
CHLORIDE: 102 mmol/L (ref 101–111)
CO2: 27 mmol/L (ref 22–32)
CREATININE: 0.8 mg/dL (ref 0.44–1.00)
GFR calc Af Amer: >60 mL/min (ref >60)
GFR calc non Af Amer: >60 mL/min (ref >60)
GLUCOSE: 188 mg/dL — AB (ref 65–99)
Potassium: 4 mmol/L (ref 3.5–5.1)
SODIUM: 137 mmol/L (ref 135–145)
Total Bilirubin: 0.6 mg/dL (ref 0.3–1.2)
Total Protein: 7.4 g/dL (ref 6.5–8.1)

## 2016-08-13 LAB — CBC WITH DIFFERENTIAL/PLATELET
BASOS PCT: 1 %
Basophils Absolute: 0 10*3/uL (ref 0.0–0.1)
Eosinophils Absolute: 0.2 10*3/uL (ref 0.0–0.7)
Eosinophils Relative: 2 %
HCT: 40.8 % (ref 36.0–46.0)
HEMOGLOBIN: 13.7 g/dL (ref 12.0–15.0)
Lymphocytes Relative: 64 %
Lymphs Abs: 4.2 10*3/uL — ABNORMAL HIGH (ref 0.7–4.0)
MCH: 31.2 pg (ref 26.0–34.0)
MCHC: 33.6 g/dL (ref 30.0–36.0)
MCV: 92.9 fL (ref 78.0–100.0)
Monocytes Absolute: 0.5 10*3/uL (ref 0.1–1.0)
Monocytes Relative: 8 %
Neutro Abs: 1.7 10*3/uL (ref 1.7–7.7)
Neutrophils Relative %: 25 %
Platelets: 184 10*3/uL (ref 150–400)
RBC: 4.39 MIL/uL (ref 3.87–5.11)
RDW: 13.5 % (ref 11.5–15.5)
WBC: 6.6 10*3/uL (ref 4.0–10.5)

## 2016-08-13 NOTE — Patient Instructions (Signed)
[place] at [facility] Discharge Instructions  RECOMMENDATIONS MADE BY THE CONSULTANT AND ANY TEST RESULTS WILL BE SENT TO YOUR REFERRING PHYSICIAN.  You saw Dr.Penland today. Follow up as needed. See Amy at checkout for appointments.  Thank you for choosing [place] at [facility] to provide your oncology and hematology care.  To afford each patient quality time with our provider, please arrive at least 15 minutes before your scheduled appointment time.   Beginning January 23rd 2017 lab work for the [facility] will be done in the  Main lab at [facility] on 1st floor. If you have a lab appointment with the [facility] please come in thru the  Main Entrance and check in at the main information desk  You need to re-schedule your appointment should you arrive 10 or more minutes late.  We strive to give you quality time with our providers, and arriving late affects you and other patients whose appointments are after yours.  Also, if you no show three or more times for appointments you may be dismissed from the clinic at the providers discretion.     Again, thank you for choosing [facility].  Our hope is that these requests will decrease the amount of time that you wait before being seen by our physicians.       _____________________________________________________________  Should you have questions after your visit to [facility], please contact our office at [phone] between the hours of 8:30 a.m. and 4:30 p.m.  Voicemails left after 4:30 p.m. will not be returned until the following business day.  For prescription refill requests, have your pharmacy contact our office.         Resources For Cancer Patients and their Caregivers ? American Cancer Society: Can assist with transportation, wigs, general needs, runs Look Good Feel Better.        9257416227 ? Cancer Care: Provides financial assistance,  online support groups, medication/co-pay assistance.  1-800-813-HOPE 6368713981) ? [facility] Assists [place] cancer patients and their families through emotional , educational and financial support.  714 507 9162 ? [place] DSS Where to apply for food stamps, Medicaid and utility assistance. 4122392242 ? [facility]: Transportation to medical appointments. 517 492 7941 ? Social Security Administration: May apply for disability if have a Stage IV cancer. 574-190-4101 (581) 325-3146 ? [employer], Disability and Transit Services: Assists with nutrition, care and transit needs. [place] Support Programs: @10RELATIVEDAYS @ > Cancer Support Group  2nd Tuesday of the month 1pm-2pm, Journey Room  > Creative Journey  3rd Tuesday of the month 1130am-1pm, Journey Room  > Look Good Feel Better  1st Wednesday of the month 10am-12 noon, Journey Room (Call [place] to register [phone])

## 2016-08-13 NOTE — Progress Notes (Signed)
Jennifer Rail, MD [address] 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 Laural [name], Remicade   Basal cell carcinoma Left anterior thigh   CURRENT THERAPY: Observation, Remicade for UC  INTERVAL HISTORY: [name] 70 y.o. female returns for follow-up of lymphocytosis. He has a known history of ulcerative colitis and is on Remicade for maintenance. She follows with Dr. Laural [name].   Patient is well with no complaints. She had a yearly mammogram.  [name] experiences neuropathy in hands and feet. She was evaluated by Dr. Posey [name] and was given medication that made her mouth dry so she stopped taking it. She also has a bone spur that is causing her a lot of pain. She notes that she has gained weight but feels unable to be active because of knee pain and her bone spurs.   PCP is Dr. Quay [name].  She has had no fever or chills.  No weight loss. No change in energy.   MEDICAL HISTORY: Past Medical History:  Diagnosis Date  . Anxiety   . 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 Complication of anesthesia   . Dental bridge present    lower  . GERD   . History of thrombocytopenia 12/2011  . HYPERTENSION   . IDDM (insulin dependent diabetes mellitus) ([place])   . OSA (obstructive sleep apnea) 09/14/2015   sleep study 09/07/15: severe obstructive sleep apnea with an AHI of 72 and SaO2 low of 75%.>refer to sleep med for eval and tx options  . Osteoarthritis   . Ulcerative colitis ([place])    Remicade infusion Q 8 weeks    has Essential hypertension; GERD; Ulcerative colitis ([place]); PALPITATIONS, RECURRENT; Dyslipidemia; Small fiber neuropathy ([place]); Anxiety; Peripheral neuropathy ([place]); Insomnia; Elevated LFTs; Myalgia; Polyarthralgia; Allergic sinusitis; Morbidly obese ([place]); Fatty liver; Lymphocytosis; Atrial  fibrillation with RVR ([place]); Dysphagia; Gastroesophageal reflux disease without esophagitis; B12 deficiency; Vitamin B6 deficiency; Paroxysmal atrial fibrillation ([place]); Chronic anticoagulation; OSA (obstructive sleep apnea), severe; Uncontrolled type 2 diabetes mellitus with peripheral neuropathy ([place]); and Vertigo on her problem list.     is allergic to actos [pioglitazone]; benzocaine-menthol; colesevelam; flagyl [metronidazole hcl]; metformin and related; omeprazole; shrimp [shellfish allergy]; statins; desipramine hcl; humulin r [insulin regular human]; hydromorphone; jardiance [empagliflozin]; adhesive [tape]; nisoldipine; and percocet [oxycodone-acetaminophen].  [name] does not currently have medications on file.  SURGICAL HISTORY: Past Surgical History:  Procedure Laterality Date  . ABDOMINAL HYSTERECTOMY  1980   partial  . BACTERIAL OVERGROWTH TEST N/A 07/13/2015   Procedure: BACTERIAL OVERGROWTH TEST;  Surgeon: Rogene Houston, MD;  [facility];  Service: Endoscopy;  Laterality: N/A;  730    . BILATERAL SALPINGOOPHORECTOMY  02/10/2001  . BREAST REDUCTION SURGERY  1994  . CARPAL TUNNEL RELEASE Right 1996  . CARPAL TUNNEL RELEASE Left 03/21/2003  . CARPAL TUNNEL RELEASE Right 05/04/2013   Procedure: CARPAL TUNNEL RELEASE;  Surgeon: Cammie Sickle., MD;  Location: [facility];  Service: Orthopedics;  Laterality: Right;  . CARPAL TUNNEL RELEASE Left 09/21/2013   Procedure: LEFT CARPAL TUNNEL RELEASE;  Surgeon: Cammie Sickle., MD;  Location: [facility];  Service: Orthopedics;  Laterality: Left;  . CHOLECYSTECTOMY    . COLONOSCOPY WITH PROPOFOL N/A 08/04/2015   Procedure: COLONOSCOPY WITH PROPOFOL;  Surgeon: Rogene Houston, MD;  Location: [facility];  Service: Endoscopy;  Laterality: N/A;  cecum time in  0820   time out  0827    total time 7 minutes  . ESOPHAGEAL DILATION N/A 08/04/2015   Procedure: ESOPHAGEAL DILATION;  Surgeon: Rogene Houston, MD;  Location: [facility];  Service: Endoscopy;  Laterality: N/A;  [place] 57, no mucousal disruption  . ESOPHAGOGASTRODUODENOSCOPY (EGD) WITH ESOPHAGEAL DILATION  12/02/2005  . ESOPHAGOGASTRODUODENOSCOPY (EGD) WITH PROPOFOL N/A 08/04/2015   Procedure: ESOPHAGOGASTRODUODENOSCOPY (EGD) WITH PROPOFOL;  Surgeon: Rogene Houston, MD;  Location: [facility];  Service: Endoscopy;  Laterality: N/A;  procedure 1  . FLEXIBLE SIGMOIDOSCOPY  01/17/2012   Procedure: FLEXIBLE SIGMOIDOSCOPY;  Surgeon: Rogene Houston, MD;  [facility];  Service: Endoscopy;  Laterality: N/A;  . HEMILAMINOTOMY LUMBAR SPINE Bilateral 09/07/1999   L4-5  . KNEE ARTHROSCOPY Right 01/1999; 10/2000  . LYSIS OF ADHESION  02/10/2001  . [place]; 09/12/2006  . TARSAL TUNNEL RELEASE  2002  . TUMOR EXCISION Left 03/21/2003   dorsal 1st web space (hand)  . URETEROLYSIS Right 02/10/2001    SOCIAL HISTORY: Social History   Social History  . Marital status: Married    Spouse name: N/A  . Number of children: N/A  . Years of education: N/A   Occupational History  . Retired    Social History Main Topics  . Smoking status: Never Smoker  . Smokeless tobacco: Never Used  . Alcohol use 0.0 oz/week     Comment: very rare  . Drug use: No  . Sexual activity: Yes    Partners: Male    Birth control/ protection: Surgical     Comment: hysterectomy   Other Topics Concern  . Not on file   Social History Narrative   She lives with husband in two-story home.  They have one grown son and 2 grandchildren.   She is retired [employer].   Highest of level education:  Some college    FAMILY HISTORY: Family History  Problem Relation Age of Onset  . Diabetes Mother   . Hypertension Mother   . Heart attack Father     Mid 34's  . Heart disease Father   . Lung disease Father     spot on lung; had lung surgery  . Alcohol abuse Other   . Hypertension Son   . Diabetes Son   . Emphysema Maternal Grandfather   .  Asthma Maternal Grandfather   . Colon cancer Paternal Grandfather   . Stomach cancer Paternal Grandmother   . Neuropathy Neg Hx     Review of Systems  Constitutional: Negative for chills, fever, malaise/fatigue and weight loss.  HENT: Negative for congestion, hearing loss, nosebleeds, sore throat and tinnitus.   Eyes: Negative for blurred vision, double vision, pain and discharge.  Respiratory: Negative for cough, hemoptysis, sputum production, shortness of breath and wheezing.   Cardiovascular: Negative for chest pain, palpitations, claudication, leg swelling and PND.  Gastrointestinal: Negative for abdominal pain, blood in stool, constipation, diarrhea, heartburn, melena, nausea and vomiting.  Genitourinary: Negative for dysuria, frequency, hematuria and urgency.  Musculoskeletal: Negative for falls, joint pain and myalgias.  Skin: Negative for itching and rash.  Neurological: Positive for tingling. Negative for dizziness, tremors, sensory change, speech change, focal weakness, seizures, loss of consciousness, weakness and headaches.       Numbness and tingling in hands and feet  Endo/Heme/Allergies: Does not bruise/bleed easily.  Psychiatric/Behavioral:  Negative for depression, memory loss, substance abuse and suicidal ideas. The patient is not nervous/anxious and does not have insomnia.    PHYSICAL EXAMINATION  ECOG PERFORMANCE STATUS: 1 - Symptomatic but completely ambulatory  Vitals:   08/13/16 1321  BP: 140/62  Pulse: 73  Resp: 16  Temp: 98.1 F (36.7 C)    Physical Exam  Constitutional: She is oriented to person, place, and time and well-developed, well-nourished, and in no distress.  HENT:  Head: Normocephalic and atraumatic.  Nose: Nose normal.  Mouth/Throat: Oropharynx is clear and moist. No oropharyngeal exudate.  Eyes: Conjunctivae and EOM are normal. Pupils are equal, round, and reactive to light. Right eye exhibits no discharge. Left eye exhibits no discharge.  No scleral icterus.  Neck: Normal range of motion. Neck supple. No tracheal deviation present. No thyromegaly present.  Cardiovascular: Normal rate, regular rhythm and normal heart sounds.  Exam reveals no gallop and no friction rub.   No murmur heard. Pulmonary/Chest: Effort normal and breath sounds normal. She has no wheezes. She has no rales.  Abdominal: Soft. Bowel sounds are normal. She exhibits no distension and no mass. There is no tenderness. There is no rebound and no guarding.  Musculoskeletal: Normal range of motion. She exhibits no edema.  Lymphadenopathy:    She has no cervical adenopathy.  Neurological: She is alert and oriented to person, place, and time. She has normal reflexes. No cranial nerve deficit. Gait normal. Coordination normal.  Skin: Skin is warm and dry. No rash noted.  Psychiatric: Mood, memory, affect and judgment normal.  Nursing note and vitals reviewed.   LABORATORY DATA:  CBC    Component Value Date/Time   WBC 6.6 08/13/2016 1114   RBC 4.39 08/13/2016 1114   HGB 13.7 08/13/2016 1114   HCT 40.8 08/13/2016 1114   PLT 184 08/13/2016 1114   MCV 92.9 08/13/2016 1114   MCH 31.2 08/13/2016 1114   MCHC 33.6 08/13/2016 1114   RDW 13.5 08/13/2016 1114   LYMPHSABS 4.2 (H) 08/13/2016 1114   MONOABS 0.5 08/13/2016 1114   EOSABS 0.2 08/13/2016 1114   BASOSABS 0.0 08/13/2016 1114   CMP     Component Value Date/Time   NA 137 08/13/2016 1114   K 4.0 08/13/2016 1114   CL 102 08/13/2016 1114   CO2 27 08/13/2016 1114   GLUCOSE 188 (H) 08/13/2016 1114   BUN 10 08/13/2016 1114   CREATININE 0.80 08/13/2016 1114   CREATININE 0.95 11/03/2015 1625   CALCIUM 8.8 (L) 08/13/2016 1114   PROT 7.4 08/13/2016 1114   PROT 6.8 08/17/2015 1244   ALBUMIN 3.6 08/13/2016 1114   AST 71 (H) 08/13/2016 1114   ALT 59 (H) 08/13/2016 1114   ALKPHOS 59 08/13/2016 1114   BILITOT 0.6 08/13/2016 1114   GFRNONAA >60 08/13/2016 1114   GFRAA >60 08/13/2016 1114     ASSESSMENT  and THERAPY PLAN:   Lymphocytosis Ulcerative Colitis HX basal cell carcinoma  70 year old female with ulcerative colitis on Remicade. History of lymphcytosis. Flow cytometry was negative. She is well-versed on the risks of Remicade therapy, at this point obviously benefits far outweigh the risks. In addition she is well aware that potential malignancies from Remicade therapy are very rare.  Given prior negative work up and the fact that her counts have normalized I feel that she can be discharged.   History of basal cell carcinoma of the skin, she was encouraged to continue with skin exams.   She has concerns about her  weight and we discussed the obvious, activity and diet.  She unfortunately feels limited because of knee pain and a bone spur.   All questions were answered. The patient knows to call the clinic with any problems, questions or concerns. We can certainly see the patient much sooner if necessary. This note was electronically signed.  This document serves as a record of services personally performed by Ancil Linsey, MD. It was created on her behalf by Elmyra Ricks, a trained medical scribe. The creation of this record is based on the scribe's personal observations and the provider's statements to them. This document has been checked and approved by the attending provider.  I have reviewed the above documentation for accuracy and completeness and I agree with the above. Molli Hazard, MD  08/13/2016

## 2016-08-14 ENCOUNTER — Ambulatory Visit (HOSPITAL_COMMUNITY): Payer: Medicare Other | Admitting: Hematology & Oncology

## 2016-08-14 ENCOUNTER — Other Ambulatory Visit (HOSPITAL_COMMUNITY): Payer: Medicare Other

## 2016-08-14 ENCOUNTER — Encounter (HOSPITAL_COMMUNITY): Payer: Self-pay | Admitting: Hematology & Oncology

## 2016-08-14 DIAGNOSIS — M170 Bilateral primary osteoarthritis of knee: Secondary | ICD-10-CM | POA: Diagnosis not present

## 2016-08-30 ENCOUNTER — Other Ambulatory Visit: Payer: Self-pay | Admitting: Obstetrics & Gynecology

## 2016-08-30 DIAGNOSIS — Z1231 Encounter for screening mammogram for malignant neoplasm of breast: Secondary | ICD-10-CM

## 2016-09-05 ENCOUNTER — Encounter: Payer: Self-pay | Admitting: Internal Medicine

## 2016-09-05 ENCOUNTER — Ambulatory Visit (INDEPENDENT_AMBULATORY_CARE_PROVIDER_SITE_OTHER): Payer: Medicare Other | Admitting: Internal Medicine

## 2016-09-05 VITALS — BP 166/90 | HR 69 | Wt 215.0 lb

## 2016-09-05 DIAGNOSIS — E1142 Type 2 diabetes mellitus with diabetic polyneuropathy: Secondary | ICD-10-CM

## 2016-09-05 DIAGNOSIS — E1165 Type 2 diabetes mellitus with hyperglycemia: Secondary | ICD-10-CM

## 2016-09-05 DIAGNOSIS — E538 Deficiency of other specified B group vitamins: Secondary | ICD-10-CM

## 2016-09-05 DIAGNOSIS — Z23 Encounter for immunization: Secondary | ICD-10-CM | POA: Diagnosis not present

## 2016-09-05 DIAGNOSIS — IMO0002 Reserved for concepts with insufficient information to code with codable children: Secondary | ICD-10-CM

## 2016-09-05 LAB — POCT GLYCOSYLATED HEMOGLOBIN (HGB A1C): HEMOGLOBIN A1C: 6.9

## 2016-09-05 LAB — VITAMIN B12: Vitamin B-12: 727 pg/mL (ref 211–911)

## 2016-09-05 MED ORDER — INSULIN REGULAR HUMAN (CONC) 500 UNIT/ML ~~LOC~~ SOPN: PEN_INJECTOR | SUBCUTANEOUS | 2 refills | Status: DC

## 2016-09-05 MED ORDER — DULAGLUTIDE 1.5 MG/0.5ML ~~LOC~~ SOAJ: SUBCUTANEOUS | 5 refills | Status: DC

## 2016-09-05 MED ORDER — INSULIN ASPART 100 UNIT/ML FLEXPEN: PEN_INJECTOR | SUBCUTANEOUS | 1 refills | Status: DC

## 2016-09-05 NOTE — Progress Notes (Signed)
Patient ID: [name], female   DOB: June 07, 1946, 70 y.o.   MRN: [id number]  HPI: [name] is a 70 y.o.-year-old female,returning for f/u for DM2, dx in 2010, insulin-dependent since 2010, uncontrolled, with complications (peripheral neuropathy). Last visit 3 mo ago. She saw [name] before (up to 2015). She has [place].   Last hemoglobin A1c was: Lab Results  Component Value Date   HGBA1C 6.5 06/06/2016   HGBA1C 7.0 02/13/2016   HGBA1C 6.2 11/14/2015   Pt was on a regimen of: - Levemir 60 units at bedtime - Novolog 130 units 3x a day, before meals + 60 units at bedtime She was on Metformin >> caused her UC (diarrhea) to be worse.  She is on: - Levemir 70 units at bedtime. - Novolog doses as follows:100 units 3x a day - Trulicity 9.16 >> 1.5 mg injection once a week She was also Metformin ER 500 mg 2x a day with meal >> diarrhea She stopped Jardiance 25 mg daily in am >> called: weak legged, nauseated, swimmy headed, and trembly  We tried U500 insulin >> could not tolerate it >> tingling in tongue, mild dysphagia - not with every dose.  Pt checks her sugars 2-3x a day and they are: - am: 82-226, 257 >> 73 x1 at 4 am, 155-309 >> 141-250 >> 144-270 >> 94-214 - 2h after b'fast: n/c >> n.c >> 170 - before lunch: 120-206 >> 110-214 >> 86-137 >> 119-160, 195 >> 123 - 2h after lunch: n/c >> 146, 353 >> 387 >> n/c - before dinner: 56, 92-158 >> 86, 98-189 >> 102, 167-193 >> 85-127, 165 >> 108-297 - 2h after dinner: n/c >> 68, 85-124 >> 176, 199 - bedtime: 93-248 >> 105-233, 372 >> 108-305 >> 86, 96-247 >> 101, 154-258, 272 - nighttime: 65-69 >> n/c Lowest sugar was 100 >> 68 >> 94; she has hypoglycemia awareness at 90s.  Highest sugar was 268 >> 300s (steroids) >> 247 >> 297.  Glucometer: One Touch Ultra Mini  Pt's meals are: - Breakfast: sausage, toast, eggs - Lunch: sandwich, chips or fruit - Dinner: chicken, potatoes, salad - Snacks: fruit  - no CKD, last  BUN/creatinine:  Lab Results  Component Value Date   BUN 10 08/13/2016   CREATININE 0.80 08/13/2016   - last set of lipids: Lab Results  Component Value Date   CHOL 230 (H) 08/29/2015   HDL 42.90 08/29/2015   LDLCALC 153 (H) 08/29/2015   LDLDIRECT 69.5 01/24/2011   TRIG 175.0 (H) 08/29/2015   CHOLHDL 5 08/29/2015   - last eye exam was in 05/2016. No Jennifer. Dr. [name] [name]. - + numbness and tingling in her feet. + small fiber neuropathy and fibromyalgia. On Neurontin.   She sees [name] for A fib. On Xarelto >> sweating, nausea, tinnitus.  At last visit, she was found to have B12 def >> started inj of B12 x6. Last inj 08/17/2016.  ROS: Constitutional: no weight gain, + fatigue, no hot flushes Eyes: no blurry vision, no xerophthalmia ENT: no sore throat, no nodules palpated in throat, no dysphagia/no odynophagia, + hoarseness Cardiovascular: no CP/SOB/no palpitations/leg swelling Respiratory: no cough/SOB Gastrointestinal:no N/V/D/C, no heartburn Musculoskeletal: no muscle aches/joint aches Skin: no rashes Neurological: no tremors/numbness/tingling/dizziness, no HA, + neuropathic pain in hands and feet  I reviewed pt's medications, allergies, PMH, social hx, family hx, and changes were documented in the history of present illness. Otherwise, unchanged from my initial visit note.  Past Medical History:  Diagnosis Date  . Anxiety   . 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 Complication of anesthesia   . Dental bridge present    lower  . GERD   . History of thrombocytopenia 12/2011  . HYPERTENSION   . IDDM (insulin dependent diabetes mellitus) ([place])   . OSA (obstructive sleep apnea) 09/14/2015   sleep study 09/07/15: severe obstructive sleep apnea with an AHI of 72 and SaO2 low of 75%.>refer to sleep med for eval and tx options  . Osteoarthritis   . Ulcerative colitis ([place])    Remicade infusion Q 8 weeks   Past Surgical History:  Procedure Laterality  Date  . ABDOMINAL HYSTERECTOMY  1980   partial  . BACTERIAL OVERGROWTH TEST N/A 07/13/2015   Procedure: BACTERIAL OVERGROWTH TEST;  Surgeon: Jennifer Houston, MD;  [facility];  Service: Endoscopy;  Laterality: N/A;  730    . BILATERAL SALPINGOOPHORECTOMY  02/10/2001  . BREAST REDUCTION SURGERY  1994  . CARPAL TUNNEL RELEASE Right 1996  . CARPAL TUNNEL RELEASE Left 03/21/2003  . CARPAL TUNNEL RELEASE Right 05/04/2013   Procedure: CARPAL TUNNEL RELEASE;  Surgeon: [name]., MD;  Location: [place];  Service: Orthopedics;  Laterality: Right;  . CARPAL TUNNEL RELEASE Left 09/21/2013   Procedure: LEFT CARPAL TUNNEL RELEASE;  Surgeon: [name]., MD;  Location: [place];  Service: Orthopedics;  Laterality: Left;  . CHOLECYSTECTOMY    . COLONOSCOPY WITH PROPOFOL N/A 08/04/2015   Procedure: COLONOSCOPY WITH PROPOFOL;  Surgeon: Jennifer Houston, MD;  Location: [facility];  Service: Endoscopy;  Laterality: N/A;  cecum time in  0820   time out  0827    total time 7 minutes  . ESOPHAGEAL DILATION N/A 08/04/2015   Procedure: ESOPHAGEAL DILATION;  Surgeon: Jennifer Houston, MD;  Location: [facility];  Service: Endoscopy;  Laterality: N/A;  [place] 57, no mucousal disruption  . ESOPHAGOGASTRODUODENOSCOPY (EGD) WITH ESOPHAGEAL DILATION  12/02/2005  . ESOPHAGOGASTRODUODENOSCOPY (EGD) WITH PROPOFOL N/A 08/04/2015   Procedure: ESOPHAGOGASTRODUODENOSCOPY (EGD) WITH PROPOFOL;  Surgeon: Jennifer Houston, MD;  Location: [facility];  Service: Endoscopy;  Laterality: N/A;  procedure 1  . FLEXIBLE SIGMOIDOSCOPY  01/17/2012   Procedure: FLEXIBLE SIGMOIDOSCOPY;  Surgeon: Jennifer Houston, MD;  [facility];  Service: Endoscopy;  Laterality: N/A;  . HEMILAMINOTOMY LUMBAR SPINE Bilateral 09/07/1999   L4-5  . KNEE ARTHROSCOPY Right 01/1999; 10/2000  . LYSIS OF ADHESION  02/10/2001  . [place]; 09/12/2006  . TARSAL TUNNEL RELEASE  2002  . TUMOR EXCISION Left  03/21/2003   dorsal 1st web space (hand)  . URETEROLYSIS Right 02/10/2001   Social History   Social History  . Marital Status: Married    Spouse Name: N/A  . Number of Children: 1   Occupational History  . 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 Smoking status: Never Smoker   . Smokeless tobacco: Never Used  . Alcohol Use: 0.0 oz/week    0 Standard drinks or equivalent per week     Comment: very rare  . Drug Use: No  . Sexual Activity:    Partners: Male   Social History Narrative   She lives with husband in two-story home.  They have one grown son and 2 grandchildren.   She is retired [employer].   Highest of level education:  college   Current Outpatient Prescriptions on  File Prior to Visit  Medication Sig Dispense Refill  . azelastine (ASTELIN) 0.1 % nasal spray Place 2 sprays into both nostrils 2 (two) times daily. 30 mL 1  . Blood Glucose Monitoring Suppl (ONE TOUCH ULTRA MINI) w/Device KIT Use to check sugars, checks 4x daily. 1 each 0  . cromolyn (OPTICROM) 4 % ophthalmic solution Place 1 drop into both eyes as needed (allergies).     . cyanocobalamin (,VITAMIN B-12,) 1000 MCG/ML injection Inject 1 mL (1,000 mcg total) into the muscle once. 1 mL 5  . dexlansoprazole (DEXILANT) 60 MG capsule Take 1 capsule (60 mg total) by mouth daily before breakfast. 90 capsule 1  . Dulaglutide (TRULICITY) 1.5 PI/9.5JO SOPN Inject under skin 1.5 mg weekly under skin. 12 pen 3  . Esomeprazole Magnesium (NEXIUM 24HR PO) Take 20 mg by mouth daily.    . furosemide (LASIX) 40 MG tablet Take 1 tablet (40 mg total) by mouth daily. 90 tablet 3  . glucose blood test strip Use as instructed to test blood sugar four times per day. DX E11.09 500 each 1  . inFLIXimab (REMICADE) 100 MG injection Inject into the vein every 8 (eight) weeks.     . insulin aspart (NOVOLOG) 100 UNIT/ML FlexPen 130 units 3 times per and 60 units at bedtime. (Patient taking differently: 130 units 3 times a  day.) 135 pen 1  . Insulin Detemir (LEVEMIR) 100 UNIT/ML Pen Inject 70 Units into the skin daily at 10 pm. 10 pen 3  . Insulin Pen Needle 31G X 6 MM MISC USE TO CHECK 3 TIMES DAILY 200 each 5  . lactase (LACTAID) 3000 UNITS tablet Take 3,000 Units by mouth as needed (when eating foods containing dairy).     Marland Kitchen loperamide (IMODIUM) 2 MG capsule Take 2 mg by mouth 3 (three) times daily as needed for diarrhea or loose stools.     . meclizine (ANTIVERT) 25 MG tablet Take 1 tablet (25 mg total) by mouth 3 (three) times daily as needed for dizziness or nausea. 90 tablet 0  . metoprolol succinate (TOPROL-XL) 100 MG 24 hr tablet Take 1 tablet (100 mg total) by mouth 2 (two) times daily. Take with or immediately following a meal. 90 tablet 1  . ONETOUCH DELICA LANCETS 84Z MISC PATIENT USES ONE TOUCH DELICAL 66A LANCETS. Use as directed to check blood sugar up to 6 times per day. DX: E11.09 500 each 3  . potassium chloride (KLOR-CON M10) 10 MEQ tablet Take 2 tablets (20 mEq total) by mouth daily. 180 tablet 3  . rivaroxaban (XARELTO) 20 MG TABS tablet Take 1 tablet (20 mg total) by mouth daily with supper. 90 tablet 3  . Syringe/Needle, Disp, (SYRINGE 3CC/22GX1") 22G X 1" 3 ML MISC Use to inject Vit B12 once monthly. 25 each 0  . [DISCONTINUED] amitriptyline (ELAVIL) 25 MG tablet Take 1 tablet (25 mg total) by mouth at bedtime. start with 1/2 tab at night for 1 week then increase to 1 whole tab. 30 tablet 3  . [DISCONTINUED] bromocriptine (PARLODEL) 2.5 MG tablet Take 2.5 mg by mouth 2 (two) times daily.      . [DISCONTINUED] potassium chloride (K-DUR) 10 MEQ tablet Take 2 tablets (20 mEq total) by mouth daily. 90 tablet 1   No current facility-administered medications on file prior to visit.    Allergies  Allergen Reactions  . Actos [Pioglitazone] Other (See Comments)    EDEMA   . Benzocaine-Menthol Swelling    SWELLING OF MOUTH  .  Colesevelam Other (See Comments)    GI UPSET  . Flagyl [Metronidazole  Hcl] Other (See Comments)    DIAPHORESIS  . Metformin And Related Diarrhea  . Omeprazole Swelling    SWELLING OF TONGUE AND THROAT  . Shrimp [Shellfish Allergy] Itching    OF THROAT AND EARS  . Statins Other (See Comments)    HEART RACING  . Desipramine Hcl Itching, Nausea Only and Other (See Comments)    "swimmy" headed, ears itched   . Humulin R [Insulin Regular Human] Other (See Comments)    U500 >> tingling in tongue, mild dysphagia   . Hydromorphone Itching  . Jardiance [Empagliflozin] Other (See Comments)    weakness  . Adhesive [Tape] Other (See Comments)    SKIN IRRITATION AND BRUISING  . Nisoldipine Itching  . Percocet [Oxycodone-Acetaminophen] Itching   Family History  Problem Relation Age of Onset  . Diabetes Mother   . Hypertension Mother   . Heart attack Father     Mid 12's  . Heart disease Father   . Lung disease Father     spot on lung; had lung surgery  . Alcohol abuse Other   . Hypertension Son   . Diabetes Son   . Emphysema Maternal Grandfather   . Asthma Maternal Grandfather   . Colon cancer Paternal Grandfather   . Stomach cancer Paternal Grandmother   . Neuropathy Neg Hx    PE: BP (!) 166/90   Pulse 69   Wt 215 lb (97.5 kg)   BMI 40.62 kg/m  Wt Readings from Last 3 Encounters:  09/05/16 215 lb (97.5 kg)  08/13/16 215 lb (97.5 kg)  07/23/16 216 lb (98 kg)   Constitutional: overweight, in NAD Eyes: PERRLA, EOMI, no exophthalmos ENT: moist mucous membranes, no thyromegaly, no cervical lymphadenopathy Cardiovascular: RRR, No MRG Respiratory: CTA B Gastrointestinal: abdomen soft, NT, ND, BS+ Musculoskeletal: no deformities, strength intact in all 4 Skin: moist, warm, no rashes Neurological: no tremor with outstretched hands, DTR normal in all 4  ASSESSMENT: 1. DM2, insulin-dependent, uncontrolled, with complications - PN  2. B12 def  PLAN:  1. Patient with long-standing, uncontrolled diabetes, on basal-bolus insulin regimen + GLP1 R  agonist. She has a large preponderence of short acting vs long acting insulin. We started to decrease the Novolog at a previous visit, but she could not tolerate the Metformin ER and the SGLT2 inh >> sugars increased >> we tried U500 insulin and continued Trulicity >> she developed tongue tingling with U500 >> now back on Levemir, NovoLog high-dose and we increased Trulicity low dose at last visit. Sugars only slightly better. She would like to retry U500 insulin. - I suggested to: Patient Instructions  Please stop Levemir and Novolog and start U500 insulin 15-30 min before a meal: - 60 units before a smaller meal - 80 units before a regular meal - 100 units before a larger meal  Please return in 1.5 months with your sugar log.    Please stop at the lab.  - continue checking sugars at different times of the day - check 3 times a day, rotating checks - advised for yearly eye exams >> she is UTD - will give flu shot today - HbA1c today >> 6.9% (higher). Will check a fructosamine - Return to clinic in 3 mo with sugar log   2. B12 def - found to have B12 def 6 mo ago >> started B12 inj   - will recheck today  Office Visit on  09/05/2016  Component Date Value Ref Range Status  . Hemoglobin A1C 09/05/2016 6.9   Final  . Vitamin B-12 09/05/2016 727  211 - 911 pg/mL Final  . Fructosamine 09/09/2016 274* 190 - 270 umol/L Final   HbA1c calculated from the fructosamine is lower than the one measured: 6.3%. Vitamin B12 is normal. We can switch to oral B12 5000 mg daily.  Philemon Kingdom, MD PhD [facility] Endocrinology

## 2016-09-05 NOTE — Patient Instructions (Addendum)
Please stop Levemir and Novolog and start U500 insulin 15-30 min before a meal: - 60 units before a smaller meal - 80 units before a regular meal - 100 units before a larger meal  Please return in 1.5 months with your sugar log.    Please stop at the lab.

## 2016-09-09 DIAGNOSIS — Z23 Encounter for immunization: Secondary | ICD-10-CM

## 2016-09-09 LAB — FRUCTOSAMINE: FRUCTOSAMINE: 274 umol/L — AB (ref 190–270)

## 2016-09-10 ENCOUNTER — Telehealth: Payer: Self-pay

## 2016-09-10 NOTE — Telephone Encounter (Signed)
-----   Message from Philemon Kingdom, MD sent at 09/09/2016  5:48 PM EST ----- Almyra Free, can you please call pt:  HbA1c calculated from the fructosamine is lower than the one measured: 6.3%. Vitamin B12 is normal. We can switch to oral B12 5000 mg daily.

## 2016-09-10 NOTE — Telephone Encounter (Signed)
Called patient. Spoke to spouse Barnabas Lister. Gave results and instructions. Spouse verbalized understanding.

## 2016-09-11 ENCOUNTER — Other Ambulatory Visit (INDEPENDENT_AMBULATORY_CARE_PROVIDER_SITE_OTHER): Payer: Self-pay | Admitting: *Deleted

## 2016-09-12 ENCOUNTER — Ambulatory Visit (HOSPITAL_COMMUNITY)
Admission: RE | Admit: 2016-09-12 | Discharge: 2016-09-12 | Disposition: A | Discharge status: Home / Self Care | Payer: Medicare Other | Source: Ambulatory Visit | Attending: Obstetrics & Gynecology | Admitting: Obstetrics & Gynecology

## 2016-09-12 DIAGNOSIS — Z1231 Encounter for screening mammogram for malignant neoplasm of breast: Secondary | ICD-10-CM | POA: Insufficient documentation

## 2016-09-17 ENCOUNTER — Encounter (HOSPITAL_COMMUNITY)
Admission: RE | Admit: 2016-09-17 | Discharge: 2016-09-17 | Disposition: A | Discharge status: Home / Self Care | Payer: Medicare Other | Source: Ambulatory Visit | Attending: Internal Medicine | Admitting: Internal Medicine

## 2016-09-17 DIAGNOSIS — K5190 Ulcerative colitis, unspecified, without complications: Secondary | ICD-10-CM | POA: Diagnosis not present

## 2016-09-17 MED ORDER — SODIUM CHLORIDE 0.9 % IV SOLN
Freq: Once | INTRAVENOUS | Status: AC
  Administered 2016-09-17: 11:00:00 via INTRAVENOUS

## 2016-09-17 MED ORDER — LORATADINE 10 MG PO TABS
10.0000 mg | ORAL_TABLET | Freq: Once | ORAL | Status: AC
  Administered 2016-09-17: 10 mg via ORAL
  Filled 2016-09-17: qty 1

## 2016-09-17 MED ORDER — INFLIXIMAB 100 MG IV SOLR
5.0000 mg/kg | Freq: Once | INTRAVENOUS | Status: AC
  Administered 2016-09-17: 500 mg via INTRAVENOUS
  Filled 2016-09-17: qty 50

## 2016-09-17 MED ORDER — ACETAMINOPHEN 325 MG PO TABS
ORAL_TABLET | ORAL | Status: AC
  Filled 2016-09-17: qty 1

## 2016-09-17 MED ORDER — ACETAMINOPHEN 325 MG PO TABS
650.0000 mg | ORAL_TABLET | Freq: Once | ORAL | Status: AC
  Administered 2016-09-17: 650 mg via ORAL
  Filled 2016-09-17: qty 2

## 2016-09-20 DIAGNOSIS — M1711 Unilateral primary osteoarthritis, right knee: Secondary | ICD-10-CM | POA: Diagnosis not present

## 2016-09-20 DIAGNOSIS — M1712 Unilateral primary osteoarthritis, left knee: Secondary | ICD-10-CM | POA: Diagnosis not present

## 2016-09-25 DIAGNOSIS — M25371 Other instability, right ankle: Secondary | ICD-10-CM | POA: Diagnosis not present

## 2016-09-25 DIAGNOSIS — M722 Plantar fascial fibromatosis: Secondary | ICD-10-CM | POA: Diagnosis not present

## 2016-09-25 DIAGNOSIS — M76822 Posterior tibial tendinitis, left leg: Secondary | ICD-10-CM | POA: Diagnosis not present

## 2016-10-01 ENCOUNTER — Other Ambulatory Visit: Payer: Self-pay | Admitting: Internal Medicine

## 2016-10-02 ENCOUNTER — Other Ambulatory Visit: Payer: Self-pay | Admitting: Internal Medicine

## 2016-10-25 ENCOUNTER — Encounter: Payer: Self-pay | Admitting: Pulmonary Disease

## 2016-10-25 ENCOUNTER — Ambulatory Visit (INDEPENDENT_AMBULATORY_CARE_PROVIDER_SITE_OTHER): Payer: Medicare Other | Admitting: Obstetrics & Gynecology

## 2016-10-25 ENCOUNTER — Ambulatory Visit (INDEPENDENT_AMBULATORY_CARE_PROVIDER_SITE_OTHER): Payer: Medicare Other | Admitting: Pulmonary Disease

## 2016-10-25 ENCOUNTER — Encounter: Payer: Self-pay | Admitting: Obstetrics & Gynecology

## 2016-10-25 VITALS — BP 148/90 | HR 72 | Resp 14 | Ht 62.0 in | Wt 218.0 lb

## 2016-10-25 VITALS — BP 138/92 | HR 70 | Ht 62.0 in | Wt 218.4 lb

## 2016-10-25 DIAGNOSIS — Z01419 Encounter for gynecological examination (general) (routine) without abnormal findings: Secondary | ICD-10-CM | POA: Diagnosis not present

## 2016-10-25 DIAGNOSIS — Z0000 Encounter for general adult medical examination without abnormal findings: Secondary | ICD-10-CM

## 2016-10-25 DIAGNOSIS — R040 Epistaxis: Secondary | ICD-10-CM | POA: Diagnosis not present

## 2016-10-25 DIAGNOSIS — E2839 Other primary ovarian failure: Secondary | ICD-10-CM

## 2016-10-25 DIAGNOSIS — Z205 Contact with and (suspected) exposure to viral hepatitis: Secondary | ICD-10-CM | POA: Diagnosis not present

## 2016-10-25 DIAGNOSIS — G4733 Obstructive sleep apnea (adult) (pediatric): Secondary | ICD-10-CM | POA: Diagnosis not present

## 2016-10-25 DIAGNOSIS — Z9989 Dependence on other enabling machines and devices: Secondary | ICD-10-CM | POA: Diagnosis not present

## 2016-10-25 LAB — POCT URINALYSIS DIPSTICK
BILIRUBIN UA: NEGATIVE
Blood, UA: NEGATIVE
KETONES UA: NEGATIVE
LEUKOCYTES UA: NEGATIVE
Nitrite, UA: NEGATIVE
Protein, UA: NEGATIVE
Urobilinogen, UA: NEGATIVE
pH, UA: 5

## 2016-10-25 NOTE — Patient Instructions (Signed)
Can go to [email or web address] or similar website to look up CPAP mask options  Follow up in 1 year

## 2016-10-25 NOTE — Patient Instructions (Signed)
Schedule your bone density with your next mammogram.

## 2016-10-25 NOTE — Progress Notes (Signed)
Current Outpatient Prescriptions on File Prior to Visit  Medication Sig  . azelastine (ASTELIN) 0.1 % nasal spray Place 2 sprays into both nostrils 2 (two) times daily.  . Blood Glucose Monitoring Suppl (ONE TOUCH ULTRA MINI) w/Device KIT Use to check sugars, checks 4x daily.  . cromolyn (OPTICROM) 4 % ophthalmic solution Place 1 drop into both eyes as needed (allergies).   . cyanocobalamin (,VITAMIN B-12,) 1000 MCG/ML injection Inject 1 mL (1,000 mcg total) into the muscle once.  . Dulaglutide (TRULICITY) 1.5 RU/0.4VW SOPN Inject under skin 1.5 mg weekly under skin.  . Esomeprazole Magnesium (NEXIUM 24HR PO) Take 20 mg by mouth daily.  . furosemide (LASIX) 40 MG tablet Take 1 tablet (40 mg total) by mouth daily.  Marland Kitchen gabapentin (NEURONTIN) 100 MG capsule Take 100 mg by mouth daily.  Marland Kitchen glucose blood test strip Use as instructed to test blood sugar four times per day. DX E11.09  . inFLIXimab (REMICADE) 100 MG injection Inject into the vein every 8 (eight) weeks.   . Insulin Pen Needle 31G X 6 MM MISC USE TO CHECK 3 TIMES DAILY  . insulin regular human CONCENTRATED (HUMULIN R U-500 KWIKPEN) 500 UNIT/ML kwikpen Inject 60-100 units 3x a day  . lactase (LACTAID) 3000 UNITS tablet Take 3,000 Units by mouth as needed (when eating foods containing dairy).   Marland Kitchen loperamide (IMODIUM) 2 MG capsule Take 2 mg by mouth 3 (three) times daily as needed for diarrhea or loose stools.   . meclizine (ANTIVERT) 25 MG tablet Take 1 tablet (25 mg total) by mouth 3 (three) times daily as needed for dizziness or nausea.  . metoprolol succinate (TOPROL-XL) 100 MG 24 hr tablet Take 1 tablet (100 mg total) by mouth 2 (two) times daily. Take with or immediately following a meal.  . ONETOUCH DELICA LANCETS 09W MISC PATIENT USES ONE TOUCH DELICAL 11B LANCETS. Use as directed to check blood sugar up to 6 times per day. DX: E11.09  . potassium chloride (KLOR-CON M10) 10 MEQ tablet Take 2 tablets (20 mEq total) by mouth daily. Overdue  for follow=up appt must see MD for refills (Patient taking differently: Take 10 mEq by mouth 2 (two) times daily. Overdue for follow=up appt must see MD for refills)  . rivaroxaban (XARELTO) 20 MG TABS tablet Take 1 tablet (20 mg total) by mouth daily with supper.  . Syringe/Needle, Disp, (SYRINGE 3CC/22GX1") 22G X 1" 3 ML MISC Use to inject Vit B12 once monthly.  . [DISCONTINUED] amitriptyline (ELAVIL) 25 MG tablet Take 1 tablet (25 mg total) by mouth at bedtime. start with 1/2 tab at night for 1 week then increase to 1 whole tab.  . [DISCONTINUED] bromocriptine (PARLODEL) 2.5 MG tablet Take 2.5 mg by mouth 2 (two) times daily.    . [DISCONTINUED] potassium chloride (K-DUR) 10 MEQ tablet Take 2 tablets (20 mEq total) by mouth daily.   No current facility-administered medications on file prior to visit.      Chief Complaint  Patient presents with  . Follow-up    Wears CPAP nightly. Denies any problems with pressure. Pt would like to discuss mask options - different setup. DME: [facility]     Cardiac tests Echo 08/04/15 >> mod LVH, EF 60 to 14%, grade 1 diastolic CHF  Sleep tests PSG 09/07/15 >> AHI 72, SpO2 low 75% Auto CPAP 09/24/16 to 10/23/16 >> used on 30 of 30 nights with average 8 hrs 6 min.  Average AHI 2 with median CPAP 8 and  95 th percentile CPAP 11 cm H2O  Past medical history Anxiety, HTN, DM, UC  Past surgical history, Family history, Social history, Allergies all reviewed.  Vital Signs BP (!) 138/92 (BP Location: Left Arm, Cuff Size: Normal)   Pulse 70   Ht 5' 2"  (1.575 m)   Wt 218 lb 6.4 oz (99.1 kg)   SpO2 96%   BMI 39.95 kg/m   History of Present Illness [name] is a 71 y.o. female with obstructive sleep apnea.  She is surprised how much CPAP has helped her sleep.  Her only issues are with mask fit and nose bleeds.  Her mask will put pressure under her eyes.  She has full face mask, and hasn't tried other types of masks.  She was told she should use full face  mask if she is a mouth breather.  She has frequent nose bleeds since childhood.  She has seen ENT before.  Physical Exam  General - No distress ENT - No sinus tenderness, no oral exudate, no LAN Cardiac - s1s2 regular, no murmur Chest - No wheeze/rales/dullness Back - No focal tenderness Abd - Soft, non-tender Ext - No edema Neuro - Normal strength Skin - No rashes Psych - normal mood, and behavior   Assessment/Plan  Obstructive sleep apnea. - she is compliant with CPAP and reports benefit - continue auto CPAP - she can look up CPAP mask options and call if she wants to try something different  Epistaxis. - she can try adjusting her humidifier on CPAP   Patient Instructions  Can go to [email or web address] or similar website to look up CPAP mask options  Follow up in 1 year    Chesley Mires, MD [place] [facility]Critical [facility]/Sleep Pager:  [phone] 10/25/2016, 12:05 PM

## 2016-10-25 NOTE — Progress Notes (Signed)
71 y.o. G1P1 MarriedCaucasianF here for annual exam.  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  On lyrica and it helps but she feels hungry all the time.  Feels a little "edgy" on this.  Denies vaginal bleeding.  Having  Using a CPAP now.  Has really helped with sleep.  Getting 8 good hours a night.    No LMP recorded. Patient has had a hysterectomy.          Sexually active: No.  The current method of family planning is status post hysterectomy.    Exercising: No.  The patient does not participate in regular exercise at present. Smoker:  no  Health Maintenance: Pap:  2012  History of abnormal Pap:  no MMG:  09/13/16 BIRADS 1 negative  Colonoscopy:  11/16 with Dr. Laural [name] at [facility]   BMD:   06/23/09  TDaP:  09/30/08  Pneumonia vaccine(s):  08/18/14  Zostavax:   08/17/12  Hep C testing: draw at end of appointment Screening Labs: done with GI, Hb today: same, Urine today: 30+ glucose   reports that she has never smoked. She has never used smokeless tobacco. She reports that she drinks alcohol. She reports that she does not use drugs.  Past Medical History:  Diagnosis Date  . Anxiety   . 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 Complication of anesthesia   . Dental bridge present    lower  . GERD   . History of thrombocytopenia 12/2011  . HYPERTENSION   . IDDM (insulin dependent diabetes mellitus) ([place])   . OSA (obstructive sleep apnea) 09/14/2015   sleep study 09/07/15: severe obstructive sleep apnea with an AHI of 72 and SaO2 low of 75%.>refer to sleep med for eval and tx options  . Osteoarthritis   . Ulcerative colitis ([place])    Remicade infusion Q 8 weeks    Past Surgical History:  Procedure Laterality Date  . ABDOMINAL HYSTERECTOMY  1980   partial  . BACTERIAL OVERGROWTH TEST N/A 07/13/2015   Procedure: BACTERIAL OVERGROWTH TEST;  Surgeon: Rogene Houston, MD;  [facility];  Service: Endoscopy;  Laterality: N/A;  730    .  BILATERAL SALPINGOOPHORECTOMY  02/10/2001  . BREAST REDUCTION SURGERY  1994  . CARPAL TUNNEL RELEASE Right 1996  . CARPAL TUNNEL RELEASE Left 03/21/2003  . CARPAL TUNNEL RELEASE Right 05/04/2013   Procedure: CARPAL TUNNEL RELEASE;  Surgeon: Cammie Sickle., MD;  Location: [place];  Service: Orthopedics;  Laterality: Right;  . CARPAL TUNNEL RELEASE Left 09/21/2013   Procedure: LEFT CARPAL TUNNEL RELEASE;  Surgeon: Cammie Sickle., MD;  Location: [place];  Service: Orthopedics;  Laterality: Left;  . CHOLECYSTECTOMY    . COLONOSCOPY WITH PROPOFOL N/A 08/04/2015   Procedure: COLONOSCOPY WITH PROPOFOL;  Surgeon: Rogene Houston, MD;  [facility];  Service: Endoscopy;  Laterality: N/A;  cecum time in  0820   time out  0827    total time 7 minutes  . ESOPHAGEAL DILATION N/A 08/04/2015   Procedure: ESOPHAGEAL DILATION;  Surgeon: Rogene Houston, MD;  [facility];  Service: Endoscopy;  Laterality: N/A;  [place] 57, no mucousal disruption  . ESOPHAGOGASTRODUODENOSCOPY (EGD) WITH ESOPHAGEAL DILATION  12/02/2005  . ESOPHAGOGASTRODUODENOSCOPY (EGD) WITH PROPOFOL N/A 08/04/2015   Procedure: ESOPHAGOGASTRODUODENOSCOPY (EGD) WITH PROPOFOL;  Surgeon: Rogene Houston, MD;  [facility];  Service: Endoscopy;  Laterality: N/A;  procedure 1  .  FLEXIBLE SIGMOIDOSCOPY  01/17/2012   Procedure: FLEXIBLE SIGMOIDOSCOPY;  Surgeon: Rogene Houston, MD;  [facility];  Service: Endoscopy;  Laterality: N/A;  . HEMILAMINOTOMY LUMBAR SPINE Bilateral 09/07/1999   L4-5  . KNEE ARTHROSCOPY Right 01/1999; 10/2000  . LYSIS OF ADHESION  02/10/2001  . [place]; 09/12/2006  . TARSAL TUNNEL RELEASE  2002  . TUMOR EXCISION Left 03/21/2003   dorsal 1st web space (hand)  . URETEROLYSIS Right 02/10/2001    Current Outpatient Prescriptions  Medication Sig Dispense Refill  . azelastine (ASTELIN) 0.1 % nasal spray Place 2 sprays into both nostrils 2 (two) times daily.  30 mL 1  . Blood Glucose Monitoring Suppl (ONE TOUCH ULTRA MINI) w/Device KIT Use to check sugars, checks 4x daily. 1 each 0  . cromolyn (OPTICROM) 4 % ophthalmic solution Place 1 drop into both eyes as needed (allergies).     . cyanocobalamin (,VITAMIN B-12,) 1000 MCG/ML injection Inject 1 mL (1,000 mcg total) into the muscle once. 1 mL 5  . Dulaglutide (TRULICITY) 1.5 GY/6.9SW SOPN Inject under skin 1.5 mg weekly under skin. 4 pen 5  . Esomeprazole Magnesium (NEXIUM 24HR PO) Take 20 mg by mouth daily.    . furosemide (LASIX) 40 MG tablet Take 1 tablet (40 mg total) by mouth daily. 90 tablet 3  . gabapentin (NEURONTIN) 100 MG capsule Take 100 mg by mouth daily.    Marland Kitchen glucose blood test strip Use as instructed to test blood sugar four times per day. DX E11.09 500 each 1  . inFLIXimab (REMICADE) 100 MG injection Inject into the vein every 8 (eight) weeks.     . Insulin Pen Needle 31G X 6 MM MISC USE TO CHECK 3 TIMES DAILY 200 each 5  . insulin regular human CONCENTRATED (HUMULIN R U-500 KWIKPEN) 500 UNIT/ML kwikpen Inject 60-100 units 3x a day 6 pen 2  . lactase (LACTAID) 3000 UNITS tablet Take 3,000 Units by mouth as needed (when eating foods containing dairy).     Marland Kitchen loperamide (IMODIUM) 2 MG capsule Take 2 mg by mouth 3 (three) times daily as needed for diarrhea or loose stools.     . meclizine (ANTIVERT) 25 MG tablet Take 1 tablet (25 mg total) by mouth 3 (three) times daily as needed for dizziness or nausea. 90 tablet 0  . metoprolol succinate (TOPROL-XL) 100 MG 24 hr tablet Take 1 tablet (100 mg total) by mouth 2 (two) times daily. Take with or immediately following a meal. 90 tablet 1  . ONETOUCH DELICA LANCETS 54O MISC PATIENT USES ONE TOUCH DELICAL 27O LANCETS. Use as directed to check blood sugar up to 6 times per day. DX: E11.09 500 each 3  . potassium chloride (KLOR-CON M10) 10 MEQ tablet Take 2 tablets (20 mEq total) by mouth daily. Overdue for follow=up appt must see MD for refills  (Patient taking differently: Take 10 mEq by mouth 2 (two) times daily. Overdue for follow=up appt must see MD for refills) 60 tablet 0  . rivaroxaban (XARELTO) 20 MG TABS tablet Take 1 tablet (20 mg total) by mouth daily with supper. 90 tablet 3  . Syringe/Needle, Disp, (SYRINGE 3CC/22GX1") 22G X 1" 3 ML MISC Use to inject Vit B12 once monthly. 25 each 0   No current facility-administered medications for this visit.     Family History  Problem Relation Age of Onset  . Diabetes Mother   . Hypertension Mother   . Heart attack Father  Mid 60's  . Heart disease Father   . Lung disease Father     spot on lung; had lung surgery  . Alcohol abuse Other   . Hypertension Son   . Diabetes Son   . Emphysema Maternal Grandfather   . Asthma Maternal Grandfather   . Colon cancer Paternal Grandfather   . Stomach cancer Paternal Grandmother   . Neuropathy Neg Hx     ROS:  Pertinent items are noted in HPI.  Otherwise, a comprehensive ROS was negative.  Exam:   BP (!) 148/90 (BP Location: Right Arm, Patient Position: Sitting, Cuff Size: Normal)   Pulse 72   Resp 14   Ht _0  (1.575 m)   Wt 218 lb (98.9 kg)   BMI 39.87 kg/m   Weight change: +10#  Height: _1  (157.5 cm)  Ht Readings from Last 3 Encounters:  10/25/16 _2  (1.575 m)  10/25/16 _3  (1.575 m)  07/23/16 _4  (1.549 m)    General appearance: alert, cooperative and appears stated age Head: Normocephalic, without obvious abnormality, atraumatic Neck: no adenopathy, supple, symmetrical, trachea midline and thyroid normal to inspection and palpation Lungs: clear to auscultation bilaterally Breasts: normal appearance, no masses or tenderness Heart: regular rate and rhythm Abdomen: soft, non-tender; bowel sounds normal; no masses,  no organomegaly Extremities: extremities normal, atraumatic, no cyanosis or edema Skin: Skin color, texture, turgor normal. No rashes or lesions Lymph nodes: Cervical, supraclavicular, and  axillary nodes normal. No abnormal inguinal nodes palpated Neurologic: Grossly normal  Pelvic: External genitalia:  no lesions              Urethra:  normal appearing urethra with no masses, tenderness or lesions              Bartholins and Skenes: normal                 Vagina: normal appearing vagina with normal color and discharge, no lesions              Cervix: absent              Pap taken: No. Bimanual Exam:  Uterus:  uterus absent              Adnexa: no mass, fullness, tenderness               Rectovaginal: Confirms               Anus:  normal sphincter tone, no lesions  Chaperone was present for exam.  A:  Well Woman with normal exam Hypertension Neuropathy Dyslipidemia GERD U.C. Followed by Dr. Laural [name].  On Remicade.  Lymphocytosis due to Remicade that has been followed by oncology.  She was released unless something changes Diabetes--on insulin. Followed by Dr. Cruzita Lederer.   Anxiety H/O TAH, later BSO  P: Mammogram guidelines reviewed. No pap obtained Hep C antibody obtained today BMD order placed.  Pt will call to schedule. AEX 1 year or follow up prn

## 2016-10-26 LAB — HEPATITIS C ANTIBODY: HCV Ab: NEGATIVE

## 2016-10-28 ENCOUNTER — Ambulatory Visit (INDEPENDENT_AMBULATORY_CARE_PROVIDER_SITE_OTHER): Payer: Medicare Other | Admitting: Internal Medicine

## 2016-10-28 ENCOUNTER — Other Ambulatory Visit: Payer: Self-pay

## 2016-10-28 ENCOUNTER — Encounter: Payer: Self-pay | Admitting: Internal Medicine

## 2016-10-28 VITALS — HR 80 | Wt 217.0 lb

## 2016-10-28 DIAGNOSIS — E1165 Type 2 diabetes mellitus with hyperglycemia: Secondary | ICD-10-CM | POA: Diagnosis not present

## 2016-10-28 DIAGNOSIS — E1142 Type 2 diabetes mellitus with diabetic polyneuropathy: Secondary | ICD-10-CM | POA: Diagnosis not present

## 2016-10-28 DIAGNOSIS — E538 Deficiency of other specified B group vitamins: Secondary | ICD-10-CM | POA: Diagnosis not present

## 2016-10-28 DIAGNOSIS — IMO0002 Reserved for concepts with insufficient information to code with codable children: Secondary | ICD-10-CM

## 2016-10-28 MED ORDER — INSULIN REGULAR HUMAN (CONC) 500 UNIT/ML ~~LOC~~ SOPN: PEN_INJECTOR | SUBCUTANEOUS | 3 refills | Status: DC

## 2016-10-28 MED ORDER — INSULIN PEN NEEDLE 31G X 5 MM MISC: 5 refills | Status: DC

## 2016-10-28 MED ORDER — DULAGLUTIDE 1.5 MG/0.5ML ~~LOC~~ SOAJ: SUBCUTANEOUS | 5 refills | Status: DC

## 2016-10-28 MED ORDER — ONETOUCH DELICA LANCETS 33G MISC: 3 refills | Status: DC

## 2016-10-28 MED ORDER — GLUCOSE BLOOD VI STRP: ORAL_STRIP | 1 refills | Status: DC

## 2016-10-28 NOTE — Progress Notes (Signed)
Patient ID: [name], female   DOB: April 08, 1946, 71 y.o.   MRN: [id number]  HPI: [name] is a 71 y.o.-year-old female,returning for f/u for DM2, dx in 2010, insulin-dependent since 2010, uncontrolled, with complications (peripheral neuropathy). Last visit 1.5 mo ago. She saw Dr. Loanne Drilling before (up to 2015). She has [place].   Last hemoglobin A1c was: 09/05/2016: HbA1c calculated from the fructosamine is lower than the one measured: 6.3%. Lab Results  Component Value Date   HGBA1C 6.9 09/05/2016   HGBA1C 6.5 06/06/2016   HGBA1C 7.0 02/13/2016   Pt was on a regimen of: - Levemir 60 units at bedtime - Novolog 130 units 3x a day, before meals + 60 units at bedtime She was on Metformin >> caused her UC (diarrhea) to be worse.  She is on: - Levemir 70 units at bedtime. - Novolog doses as follows:100 units 3x a day - Trulicity 1.66 >> 1.5 mg injection once a week She was also Metformin ER 500 mg 2x a day with meal >> diarrhea She stopped Jardiance 25 mg daily in am >> called: weak legged, nauseated, swimmy headed, and trembly  We tried U500 insulin in the past >> could not tolerate it >> tingling in tongue, mild dysphagia - not with every dose.  In 08/2016, we retried U500 >> she is tolerating this well now: - U500 insulin 15-30 min before a meal: - 60 units before a smaller meal - 80 units before a regular meal - 100 units before a larger meal  - Trulicity 1.5 mg weekly.  Pt checks her sugars 2-3x a day and they are: - am: 82-226, 257 >> 73 x1 at 4 am, 155-309 >> 141-250 >> 144-270 >> 94-214 >> 80-251 - 2h after b'fast: n/c >> n.c >> 170 >> n/c - before lunch: 120-206 >> 110-214 >> 86-137 >> 119-160, 195 >> 123 >> 208 - 2h after lunch: n/c >> 146, 353 >> 387 >> n/c - before dinner: 56, 92-158 >> 86, 98-189 >> 102, 167-193 >> 85-127, 165 >> 108-297 >> n/c - 2h after dinner: n/c >> 68, 85-124 >> 176, 199 >> 290 - bedtime: 93-248 >> 105-233, 372 >> 108-305 >> 86,  96-247 >> 101, 154-258, 272 >> 129, 135-308 - nighttime: 65-69 >> n/c >> 296 Lowest sugar was 100 >> 68 >> 94 >> 80; she has hypoglycemia awareness at 90s.  Highest sugar was 268 >> 300s (steroids) >> 247 >> 297 >> 309.  Glucometer: One Touch Ultra Mini  Pt's meals are: - Breakfast: sausage, toast, eggs - Lunch: sandwich, chips or fruit - Dinner: chicken, potatoes, salad - Snacks: fruit  - no CKD, last BUN/creatinine:  Lab Results  Component Value Date   BUN 10 08/13/2016   CREATININE 0.80 08/13/2016   - last set of lipids: Lab Results  Component Value Date   CHOL 230 (H) 08/29/2015   HDL 42.90 08/29/2015   LDLCALC 153 (H) 08/29/2015   LDLDIRECT 69.5 01/24/2011   TRIG 175.0 (H) 08/29/2015   CHOLHDL 5 08/29/2015   - last eye exam was in 05/2016. No DR. Dr. [name] Fitch. - + numbness and tingling in her feet. + small fiber neuropathy and fibromyalgia. On Neurontin >> increased from 100 to 300 mg daily.  She sees Dr Stanford Breed for A fib. On Xarelto >> sweating, nausea, tinnitus.  At last visit, she was found to have B12 def >> started inj of B12 x6. Last inj 08/2016. I recommended 5000 mcg daily >>  did not start yet.  ROS: Constitutional: no weight gain, no fatigue, no hot flushes Eyes: no blurry vision, no xerophthalmia ENT: no sore throat, no nodules palpated in throat, no dysphagia/no odynophagia, no hoarseness Cardiovascular: no CP/SOB/no palpitations/leg swelling Respiratory: no cough/SOB Gastrointestinal:no N/V/D/C, no heartburn Musculoskeletal: no muscle aches/joint aches Skin: no rashes Neurological: no tremors/numbness/tingling/dizziness, no HA, + neuropathic pain in hands and feet  I reviewed pt's medications, allergies, PMH, social hx, family hx, and changes were documented in the history of present illness. Otherwise, unchanged from my initial visit note.  Past Medical History:  Diagnosis Date  . Anxiety   . 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 Complication of anesthesia   . Dental bridge present    lower  . GERD   . History of thrombocytopenia 12/2011  . HYPERTENSION   . IDDM (insulin dependent diabetes mellitus) ([place])   . OSA (obstructive sleep apnea) 09/14/2015   sleep study 09/07/15: severe obstructive sleep apnea with an AHI of 72 and SaO2 low of 75%.>refer to sleep med for eval and tx options  . Osteoarthritis   . Ulcerative colitis ([place])    Remicade infusion Q 8 weeks   Past Surgical History:  Procedure Laterality Date  . ABDOMINAL HYSTERECTOMY  1980   partial  . BACTERIAL OVERGROWTH TEST N/A 07/13/2015   Procedure: BACTERIAL OVERGROWTH TEST;  Surgeon: Rogene Houston, MD;  [facility];  Service: Endoscopy;  Laterality: N/A;  730    . BILATERAL SALPINGOOPHORECTOMY  02/10/2001  . BREAST REDUCTION SURGERY  1994  . CARPAL TUNNEL RELEASE Right 1996  . CARPAL TUNNEL RELEASE Left 03/21/2003  . CARPAL TUNNEL RELEASE Right 05/04/2013   Procedure: CARPAL TUNNEL RELEASE;  Surgeon: Cammie Sickle., MD;  Location: [place];  Service: Orthopedics;  Laterality: Right;  . CARPAL TUNNEL RELEASE Left 09/21/2013   Procedure: LEFT CARPAL TUNNEL RELEASE;  Surgeon: Cammie Sickle., MD;  Location: [place];  Service: Orthopedics;  Laterality: Left;  . CHOLECYSTECTOMY    . COLONOSCOPY WITH PROPOFOL N/A 08/04/2015   Procedure: COLONOSCOPY WITH PROPOFOL;  Surgeon: Rogene Houston, MD;  [facility];  Service: Endoscopy;  Laterality: N/A;  cecum time in  0820   time out  0827    total time 7 minutes  . ESOPHAGEAL DILATION N/A 08/04/2015   Procedure: ESOPHAGEAL DILATION;  Surgeon: Rogene Houston, MD;  [facility];  Service: Endoscopy;  Laterality: N/A;  [place] 56, no mucousal disruption  . ESOPHAGOGASTRODUODENOSCOPY (EGD) WITH ESOPHAGEAL DILATION  12/02/2005  . ESOPHAGOGASTRODUODENOSCOPY (EGD) WITH PROPOFOL N/A 08/04/2015   Procedure: ESOPHAGOGASTRODUODENOSCOPY (EGD) WITH PROPOFOL;   Surgeon: Rogene Houston, MD;  [facility];  Service: Endoscopy;  Laterality: N/A;  procedure 1  . FLEXIBLE SIGMOIDOSCOPY  01/17/2012   Procedure: FLEXIBLE SIGMOIDOSCOPY;  Surgeon: Rogene Houston, MD;  [facility];  Service: Endoscopy;  Laterality: N/A;  . HEMILAMINOTOMY LUMBAR SPINE Bilateral 09/07/1999   L4-5  . KNEE ARTHROSCOPY Right 01/1999; 10/2000  . LYSIS OF ADHESION  02/10/2001  . [place]; 09/12/2006  . TARSAL TUNNEL RELEASE  2002  . TUMOR EXCISION Left 03/21/2003   dorsal 1st web space (hand)  . URETEROLYSIS Right 02/10/2001   Social History   Social History  . Marital Status: Married    Spouse Name: N/A  . Number of Children: 1   Occupational History  . Retired Tourist information centre manager  Social History Main Topics  . Smoking status: Never Smoker   . Smokeless tobacco: Never Used  . Alcohol Use: 0.0 oz/week    0 Standard drinks or equivalent per week     Comment: very rare  . Drug Use: No  . Sexual Activity:    Partners: Male   Social History Narrative   She lives with husband in two-story home.  They have one grown son and 2 grandchildren.   She is retired [employer].   Highest of level education:  college   Current Outpatient Prescriptions on File Prior to Visit  Medication Sig Dispense Refill  . azelastine (ASTELIN) 0.1 % nasal spray Place 2 sprays into both nostrils 2 (two) times daily. 30 mL 1  . Blood Glucose Monitoring Suppl (ONE TOUCH ULTRA MINI) w/Device KIT Use to check sugars, checks 4x daily. 1 each 0  . cromolyn (OPTICROM) 4 % ophthalmic solution Place 1 drop into both eyes as needed (allergies).     . cyanocobalamin (,VITAMIN B-12,) 1000 MCG/ML injection Inject 1 mL (1,000 mcg total) into the muscle once. 1 mL 5  . Dulaglutide (TRULICITY) 1.5 PY/1.9JK SOPN Inject under skin 1.5 mg weekly under skin. 4 pen 5  . Esomeprazole Magnesium (NEXIUM 24HR PO) Take 20 mg by mouth daily.    . furosemide (LASIX) 40 MG tablet Take 1 tablet (40  mg total) by mouth daily. 90 tablet 3  . gabapentin (NEURONTIN) 100 MG capsule Take 300 mg by mouth daily.     Marland Kitchen glucose blood test strip Use as instructed to test blood sugar four times per day. DX E11.09 500 each 1  . inFLIXimab (REMICADE) 100 MG injection Inject into the vein every 8 (eight) weeks.     . Insulin Pen Needle 31G X 6 MM MISC USE TO CHECK 3 TIMES DAILY 200 each 5  . insulin regular human CONCENTRATED (HUMULIN R U-500 KWIKPEN) 500 UNIT/ML kwikpen Inject 60-100 units 3x a day 6 pen 2  . lactase (LACTAID) 3000 UNITS tablet Take 3,000 Units by mouth as needed (when eating foods containing dairy).     Marland Kitchen loperamide (IMODIUM) 2 MG capsule Take 2 mg by mouth 3 (three) times daily as needed for diarrhea or loose stools.     . meclizine (ANTIVERT) 25 MG tablet Take 1 tablet (25 mg total) by mouth 3 (three) times daily as needed for dizziness or nausea. 90 tablet 0  . metoprolol succinate (TOPROL-XL) 100 MG 24 hr tablet Take 1 tablet (100 mg total) by mouth 2 (two) times daily. Take with or immediately following a meal. 90 tablet 1  . ONETOUCH DELICA LANCETS 93O MISC PATIENT USES ONE TOUCH DELICAL 67T LANCETS. Use as directed to check blood sugar up to 6 times per day. DX: E11.09 500 each 3  . potassium chloride (KLOR-CON M10) 10 MEQ tablet Take 2 tablets (20 mEq total) by mouth daily. Overdue for follow=up appt must see MD for refills (Patient taking differently: Take 10 mEq by mouth 2 (two) times daily. Overdue for follow=up appt must see MD for refills) 60 tablet 0  . rivaroxaban (XARELTO) 20 MG TABS tablet Take 1 tablet (20 mg total) by mouth daily with supper. 90 tablet 3  . Syringe/Needle, Disp, (SYRINGE 3CC/22GX1") 22G X 1" 3 ML MISC Use to inject Vit B12 once monthly. 25 each 0  . [DISCONTINUED] amitriptyline (ELAVIL) 25 MG tablet Take 1 tablet (25 mg total) by mouth at bedtime. start with 1/2 tab at  night for 1 week then increase to 1 whole tab. 30 tablet 3  . [DISCONTINUED]  bromocriptine (PARLODEL) 2.5 MG tablet Take 2.5 mg by mouth 2 (two) times daily.      . [DISCONTINUED] potassium chloride (K-DUR) 10 MEQ tablet Take 2 tablets (20 mEq total) by mouth daily. 90 tablet 1   No current facility-administered medications on file prior to visit.    Allergies  Allergen Reactions  . Actos [Pioglitazone] Other (See Comments)    EDEMA   . Benzocaine-Menthol Swelling    SWELLING OF MOUTH  . Colesevelam Other (See Comments)    GI UPSET  . Flagyl [Metronidazole Hcl] Other (See Comments)    DIAPHORESIS  . Metformin And Related Diarrhea  . Omeprazole Swelling    SWELLING OF TONGUE AND THROAT  . Shrimp [Shellfish Allergy] Itching    OF THROAT AND EARS  . Statins Other (See Comments)    HEART RACING  . Desipramine Hcl Itching, Nausea Only and Other (See Comments)    "swimmy" headed, ears itched   . Hydromorphone Itching  . Jardiance [Empagliflozin] Other (See Comments)    weakness  . Adhesive [Tape] Other (See Comments)    SKIN IRRITATION AND BRUISING  . Nisoldipine Itching  . Percocet [Oxycodone-Acetaminophen] Itching   Family History  Problem Relation Age of Onset  . Diabetes Mother   . Hypertension Mother   . Heart attack Father     Mid 40's  . Heart disease Father   . Lung disease Father     spot on lung; had lung surgery  . Alcohol abuse Other   . Hypertension Son   . Diabetes Son   . Emphysema Maternal Grandfather   . Asthma Maternal Grandfather   . Colon cancer Paternal Grandfather   . Stomach cancer Paternal Grandmother   . Neuropathy Neg Hx    PE: Pulse 80   Wt 217 lb (98.4 kg)   SpO2 97%   BMI 39.69 kg/m  Wt Readings from Last 3 Encounters:  10/28/16 217 lb (98.4 kg)  10/25/16 218 lb (98.9 kg)  10/25/16 218 lb 6.4 oz (99.1 kg)   Constitutional: overweight, in NAD Eyes: PERRLA, EOMI, no exophthalmos ENT: moist mucous membranes, no thyromegaly, no cervical lymphadenopathy Cardiovascular: RRR, No MRG Respiratory: CTA  B Gastrointestinal: abdomen soft, NT, ND, BS+ Musculoskeletal: no deformities, strength intact in all 4 Skin: moist, warm, no rashes Neurological: no tremor with outstretched hands, DTR normal in all 4  ASSESSMENT: 1. DM2, insulin-dependent, uncontrolled, with complications - PN  2. B12 def  PLAN:  1. Patient with long-standing, uncontrolled diabetes, now on U500 insulin regimen + GLP1 R agonist. Her sugars are higher >> will increase insulin. She is hungry "all the time". Discussed the need to reduce portions to be able to use less insulin to in turn reduce hunger. - She could not tolerate the Metformin ER and the SGLT2 inh >> sugars increased. - I suggested to: Patient Instructions  Please continue: - Trulicity 1.5 mg weekly.  Please increase U500: - 80 units before a smaller meal - 100 units before a regular meal - 120 units before a larger meal or with dinner  Use the 30 units bedtime shot if sugars at bedtime are still >200.  Please return in 1.5 months with your sugar log.   - continue checking sugars at different times of the day - check 3 times a day, rotating checks - advised for yearly eye exams >> she is UTD -  given flu shot at last visit - Return to clinic in 1.5 mo with sugar log   2. B12 def - found to have B12 def 6 mo ago >> started B12 inj  >> vit B12 normal at last visit >> I recommended B12 po 5000 mcg daily >> did not start >> will start now  Jennifer Kingdom, MD PhD [facility] Endocrinology

## 2016-10-28 NOTE — Patient Instructions (Signed)
Please continue: - Trulicity 1.5 mg weekly.  Please increase U500: - 80 units before a smaller meal - 100 units before a regular meal - 120 units before a larger meal or with dinner  Use the 30 units bedtime shot if sugars at bedtime are still >200.  Please return in 1.5 months with your sugar log.

## 2016-10-30 ENCOUNTER — Other Ambulatory Visit: Payer: Self-pay | Admitting: Internal Medicine

## 2016-10-31 DIAGNOSIS — M76822 Posterior tibial tendinitis, left leg: Secondary | ICD-10-CM | POA: Diagnosis not present

## 2016-10-31 DIAGNOSIS — M722 Plantar fascial fibromatosis: Secondary | ICD-10-CM | POA: Diagnosis not present

## 2016-11-04 ENCOUNTER — Telehealth: Payer: Self-pay

## 2016-11-04 ENCOUNTER — Telehealth: Payer: Self-pay | Admitting: Internal Medicine

## 2016-11-04 MED ORDER — DULAGLUTIDE 1.5 MG/0.5ML ~~LOC~~ SOAJ: SUBCUTANEOUS | 1 refills | Status: DC

## 2016-11-04 NOTE — Telephone Encounter (Signed)
90 day supply submitted.

## 2016-11-04 NOTE — Telephone Encounter (Signed)
The pt is asking for the trulicity to be a 90 day supply pen needles are to have a quantity of 600

## 2016-11-04 NOTE — Telephone Encounter (Signed)
90 day supply of trulicity submitted.

## 2016-11-05 NOTE — Telephone Encounter (Signed)
Patient called concerning her medication Trulicity she don't think prescription was written correctly.

## 2016-11-06 ENCOUNTER — Telehealth: Payer: Self-pay | Admitting: Internal Medicine

## 2016-11-06 ENCOUNTER — Telehealth: Payer: Self-pay

## 2016-11-06 MED ORDER — INSULIN PEN NEEDLE 31G X 5 MM MISC: 5 refills | Status: DC

## 2016-11-06 NOTE — Telephone Encounter (Signed)
Called patient to discuss trulicity medication directions. Gave call back number.

## 2016-11-06 NOTE — Telephone Encounter (Signed)
Patient ask you to give her a call

## 2016-11-06 NOTE — Telephone Encounter (Signed)
Called patient and discussed the questions she had regarding her pen needles, submitted the right amount of pen needles. Patient had no other questions at this time.

## 2016-11-12 ENCOUNTER — Encounter (HOSPITAL_COMMUNITY)
Admission: RE | Admit: 2016-11-12 | Discharge: 2016-11-12 | Disposition: A | Discharge status: Home / Self Care | Payer: Medicare Other | Source: Ambulatory Visit | Attending: Internal Medicine | Admitting: Internal Medicine

## 2016-11-12 ENCOUNTER — Encounter (HOSPITAL_COMMUNITY): Payer: Self-pay

## 2016-11-12 DIAGNOSIS — K5190 Ulcerative colitis, unspecified, without complications: Secondary | ICD-10-CM | POA: Diagnosis present

## 2016-11-12 MED ORDER — SODIUM CHLORIDE 0.9 % IV SOLN
400.0000 mg | Freq: Once | INTRAVENOUS | Status: AC
  Administered 2016-11-12: 400 mg via INTRAVENOUS
  Filled 2016-11-12: qty 40

## 2016-11-12 MED ORDER — LORATADINE 10 MG PO TABS
10.0000 mg | ORAL_TABLET | Freq: Once | ORAL | Status: AC
  Administered 2016-11-12: 10 mg via ORAL

## 2016-11-12 MED ORDER — LORATADINE 10 MG PO TABS
ORAL_TABLET | ORAL | Status: AC
  Filled 2016-11-12: qty 1

## 2016-11-12 MED ORDER — ACETAMINOPHEN 325 MG PO TABS
650.0000 mg | ORAL_TABLET | Freq: Once | ORAL | Status: AC
  Administered 2016-11-12: 650 mg via ORAL

## 2016-11-12 MED ORDER — ACETAMINOPHEN 325 MG PO TABS
ORAL_TABLET | ORAL | Status: AC
  Filled 2016-11-12: qty 2

## 2016-11-12 MED ORDER — SODIUM CHLORIDE 0.9 % IV SOLN
Freq: Once | INTRAVENOUS | Status: AC
  Administered 2016-11-12: 250 mL via INTRAVENOUS

## 2016-11-14 ENCOUNTER — Other Ambulatory Visit: Payer: Self-pay | Admitting: Internal Medicine

## 2016-11-20 ENCOUNTER — Other Ambulatory Visit: Payer: Self-pay | Admitting: Internal Medicine

## 2016-11-20 NOTE — Telephone Encounter (Signed)
Ok to refill [employer]

## 2016-11-21 ENCOUNTER — Other Ambulatory Visit: Payer: Self-pay | Admitting: Internal Medicine

## 2016-11-26 ENCOUNTER — Encounter: Payer: Self-pay | Admitting: Internal Medicine

## 2016-11-26 ENCOUNTER — Ambulatory Visit (INDEPENDENT_AMBULATORY_CARE_PROVIDER_SITE_OTHER): Payer: Medicare Other | Admitting: Internal Medicine

## 2016-11-26 VITALS — BP 138/68 | HR 72 | Temp 97.9°F | Resp 16 | Ht 62.0 in | Wt 217.0 lb

## 2016-11-26 DIAGNOSIS — E1165 Type 2 diabetes mellitus with hyperglycemia: Secondary | ICD-10-CM

## 2016-11-26 DIAGNOSIS — I10 Essential (primary) hypertension: Secondary | ICD-10-CM | POA: Diagnosis not present

## 2016-11-26 DIAGNOSIS — K219 Gastro-esophageal reflux disease without esophagitis: Secondary | ICD-10-CM | POA: Diagnosis not present

## 2016-11-26 DIAGNOSIS — E1142 Type 2 diabetes mellitus with diabetic polyneuropathy: Secondary | ICD-10-CM

## 2016-11-26 DIAGNOSIS — G629 Polyneuropathy, unspecified: Secondary | ICD-10-CM | POA: Diagnosis not present

## 2016-11-26 DIAGNOSIS — Z0000 Encounter for general adult medical examination without abnormal findings: Secondary | ICD-10-CM | POA: Diagnosis not present

## 2016-11-26 DIAGNOSIS — Z23 Encounter for immunization: Secondary | ICD-10-CM

## 2016-11-26 DIAGNOSIS — IMO0002 Reserved for concepts with insufficient information to code with codable children: Secondary | ICD-10-CM

## 2016-11-26 MED ORDER — METOPROLOL SUCCINATE ER 100 MG PO TB24: 100.0000 mg | ORAL_TABLET | Freq: Two times a day (BID) | ORAL | 3 refills | Status: DC

## 2016-11-26 MED ORDER — PANTOPRAZOLE SODIUM 40 MG PO TBEC: 40.0000 mg | DELAYED_RELEASE_TABLET | Freq: Every day | ORAL | 3 refills | Status: DC

## 2016-11-26 MED ORDER — GABAPENTIN 600 MG PO TABS: 600.0000 mg | ORAL_TABLET | Freq: Every day | ORAL | 1 refills | Status: DC

## 2016-11-26 MED ORDER — FUROSEMIDE 40 MG PO TABS: 40.0000 mg | ORAL_TABLET | Freq: Every day | ORAL | 3 refills | Status: DC

## 2016-11-26 MED ORDER — POTASSIUM CHLORIDE CRYS ER 10 MEQ PO TBCR: 20.0000 meq | EXTENDED_RELEASE_TABLET | Freq: Every day | ORAL | 3 refills | Status: DC

## 2016-11-26 NOTE — Patient Instructions (Addendum)
  Test(s) ordered today. Your results will be released to [place] (or called to you) after review, usually within 72hours after test completion. If any changes need to be made, you will be notified at that same time.  All other Health Maintenance issues reviewed.   All recommended immunizations and age-appropriate screenings are up-to-date or discussed.  Pneumovax vaccine administered today.   Medications reviewed and updated.  Changes include increasing gabapentin to 600 mg at night.   Your prescription(s) have been submitted to your pharmacy. Please take as directed and contact our office if you believe you are having problem(s) with the medication(s).     Please followup in 60month

## 2016-11-26 NOTE — Progress Notes (Signed)
Subjective:    Patient ID: [name], female    DOB: Feb 07, 1946, 71 y.o.   MRN: [id number]  HPI She is here for a physical exam.   Peripheral neuroapthy:  She is taking 500 mg of gabapentin nightly and she still has some symptoms.  She would like to increase to 600 mg at night.  She has seen improvement with increasing the dose.      Medications and allergies reviewed with patient and updated if appropriate.  Patient Active Problem List   Diagnosis Date Noted  . Vertigo 12/04/2015  . Uncontrolled type 2 diabetes mellitus with peripheral neuropathy ([place]) 11/14/2015  . OSA (obstructive sleep apnea), severe 09/14/2015  . B12 deficiency 08/17/2015  . Vitamin B6 deficiency 08/17/2015  . Paroxysmal atrial fibrillation ([place]) 08/17/2015  . Chronic anticoagulation 08/17/2015  . Atrial fibrillation with RVR ([place]) 08/04/2015  . Dysphagia   . Gastroesophageal reflux disease without esophagitis   . Lymphocytosis 07/04/2015  . Fatty liver 06/09/2014  . Morbidly obese ([place]) 05/18/2014  . Allergic sinusitis 12/09/2013  . Myalgia 04/28/2012  . Polyarthralgia 04/28/2012  . Elevated LFTs 01/16/2012  . Insomnia 01/12/2012  . Peripheral neuropathy ([place]) 12/05/2011  . Small fiber neuropathy ([place]) 05/30/2011  . Anxiety 05/30/2011  . Dyslipidemia 01/24/2011  . PALPITATIONS, RECURRENT 08/08/2010  . Essential hypertension 04/12/2010  . GERD 04/12/2010  . Ulcerative colitis ([place]) 04/12/2010    Current Outpatient Prescriptions on File Prior to Visit  Medication Sig Dispense Refill  . azelastine (ASTELIN) 0.1 % nasal spray Place 2 sprays into both nostrils 2 (two) times daily. 30 mL 1  . Blood Glucose Monitoring Suppl (ONE TOUCH ULTRA MINI) w/Device KIT Use to check sugars, checks 4x daily. 1 each 0  . cromolyn (OPTICROM) 4 % ophthalmic solution Place 1 drop into both eyes as needed (allergies).     . Dulaglutide (TRULICITY) 1.5 FV/4.9SW SOPN Inject under skin 1.5 mg weekly under skin.  12 pen 1  . Esomeprazole Magnesium (NEXIUM 24HR PO) Take 20 mg by mouth daily.    . furosemide (LASIX) 40 MG tablet Take 1 tablet (40 mg total) by mouth daily. 90 tablet 3  . gabapentin (NEURONTIN) 100 MG capsule Take 400 mg by mouth daily.     Marland Kitchen glucose blood test strip Use as instructed to test blood sugar four times per day. DX E11.09 500 each 1  . inFLIXimab (REMICADE) 100 MG injection Inject into the vein every 8 (eight) weeks.     . Insulin Pen Needle 31G X 5 MM MISC Use to inject insulins equal to 6 times daily. 600 each 5  . insulin regular human CONCENTRATED (HUMULIN R U-500 KWIKPEN) 500 UNIT/ML kwikpen Inject 80-120 units 3x a day 12 pen 3  . lactase (LACTAID) 3000 UNITS tablet Take 3,000 Units by mouth as needed (when eating foods containing dairy).     Marland Kitchen loperamide (IMODIUM) 2 MG capsule Take 2 mg by mouth 3 (three) times daily as needed for diarrhea or loose stools.     . meclizine (ANTIVERT) 25 MG tablet Take 1 tablet (25 mg total) by mouth 3 (three) times daily as needed for dizziness or nausea. 90 tablet 0  . metoprolol succinate (TOPROL-XL) 100 MG 24 hr tablet Take 1 tablet (100 mg total) by mouth 2 (two) times daily. Take with or immediately following a meal. 90 tablet 1  . ONETOUCH DELICA LANCETS 96P MISC PATIENT USES ONE TOUCH DELICAL 59F LANCETS. Use as directed to check  blood sugar up to 6 times per day. DX: E11.09 500 each 3  . potassium chloride (KLOR-CON M10) 10 MEQ tablet Take 2 tablets (20 mEq total) by mouth daily. 60 tablet 0  . rivaroxaban (XARELTO) 20 MG TABS tablet Take 1 tablet (20 mg total) by mouth daily with supper. 90 tablet 3  . Syringe/Needle, Disp, (SYRINGE 3CC/22GX1") 22G X 1" 3 ML MISC Use to inject Vit B12 once monthly. 25 each 0  . XARELTO 20 MG TABS tablet TAKE 1 TABLET (20 MG TOTAL) BY MOUTH DAILY WITH SUPPER. 90 tablet 1  . [DISCONTINUED] amitriptyline (ELAVIL) 25 MG tablet Take 1 tablet (25 mg total) by mouth at bedtime. start with 1/2 tab at night for  1 week then increase to 1 whole tab. 30 tablet 3  . [DISCONTINUED] bromocriptine (PARLODEL) 2.5 MG tablet Take 2.5 mg by mouth 2 (two) times daily.      . [DISCONTINUED] potassium chloride (K-DUR) 10 MEQ tablet Take 2 tablets (20 mEq total) by mouth daily. 90 tablet 1   No current facility-administered medications on file prior to visit.     Past Medical History:  Diagnosis Date  . Anxiety   . 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 Complication of anesthesia   . Dental bridge present    lower  . GERD   . History of thrombocytopenia 12/2011  . HYPERTENSION   . IDDM (insulin dependent diabetes mellitus) ([place])   . OSA (obstructive sleep apnea) 09/14/2015   sleep study 09/07/15: severe obstructive sleep apnea with an AHI of 72 and SaO2 low of 75%.>refer to sleep med for eval and tx options  . Osteoarthritis   . Ulcerative colitis ([place])    Remicade infusion Q 8 weeks    Past Surgical History:  Procedure Laterality Date  . ABDOMINAL HYSTERECTOMY  1980   partial  . BACTERIAL OVERGROWTH TEST N/A 07/13/2015   Procedure: BACTERIAL OVERGROWTH TEST;  Surgeon: Rogene Houston, MD;  [facility];  Service: Endoscopy;  Laterality: N/A;  730    . BILATERAL SALPINGOOPHORECTOMY  02/10/2001  . BREAST REDUCTION SURGERY  1994  . CARPAL TUNNEL RELEASE Right 1996  . CARPAL TUNNEL RELEASE Left 03/21/2003  . CARPAL TUNNEL RELEASE Right 05/04/2013   Procedure: CARPAL TUNNEL RELEASE;  Surgeon: Cammie Sickle., MD;  Location: [facility];  Service: Orthopedics;  Laterality: Right;  . CARPAL TUNNEL RELEASE Left 09/21/2013   Procedure: LEFT CARPAL TUNNEL RELEASE;  Surgeon: Cammie Sickle., MD;  Location: [facility];  Service: Orthopedics;  Laterality: Left;  . CHOLECYSTECTOMY    . COLONOSCOPY WITH PROPOFOL N/A 08/04/2015   Procedure: COLONOSCOPY WITH PROPOFOL;  Surgeon: Rogene Houston, MD;  [facility];  Service: Endoscopy;   Laterality: N/A;  cecum time in  0820   time out  0827    total time 7 minutes  . ESOPHAGEAL DILATION N/A 08/04/2015   Procedure: ESOPHAGEAL DILATION;  Surgeon: Rogene Houston, MD;  [facility];  Service: Endoscopy;  Laterality: N/A;  Maloney 57, no mucousal disruption  . ESOPHAGOGASTRODUODENOSCOPY (EGD) WITH ESOPHAGEAL DILATION  12/02/2005  . ESOPHAGOGASTRODUODENOSCOPY (EGD) WITH PROPOFOL N/A 08/04/2015   Procedure: ESOPHAGOGASTRODUODENOSCOPY (EGD) WITH PROPOFOL;  Surgeon: Rogene Houston, MD;  [facility];  Service: Endoscopy;  Laterality: N/A;  procedure 1  . FLEXIBLE SIGMOIDOSCOPY  01/17/2012   Procedure: FLEXIBLE SIGMOIDOSCOPY;  Surgeon: Rogene Houston, MD;  [facility];  Service: Endoscopy;  Laterality: N/A;  . HEMILAMINOTOMY LUMBAR SPINE Bilateral 09/07/1999   L4-5  . KNEE ARTHROSCOPY Right 01/1999; 10/2000  . LYSIS OF ADHESION  02/10/2001  . [place]; 09/12/2006  . TARSAL TUNNEL RELEASE  2002  . TUMOR EXCISION Left 03/21/2003   dorsal 1st web space (hand)  . URETEROLYSIS Right 02/10/2001    Social History   Social History  . Marital status: Married    Spouse name: N/A  . Number of children: N/A  . Years of education: N/A   Occupational History  . Retired    Social History Main Topics  . Smoking status: Never Smoker  . Smokeless tobacco: Never Used  . Alcohol use 0.0 oz/week     Comment: very rare  . Drug use: No  . Sexual activity: Yes    Partners: Male    Birth control/ protection: Surgical     Comment: hysterectomy   Other Topics Concern  . Not on file   Social History Narrative   She lives with husband in two-story home.  They have one grown son and 2 grandchildren.   She is retired [employer].   Highest of level education:  Some college    Family History  Problem Relation Age of Onset  . Diabetes Mother   . Hypertension Mother   . Heart attack Father     Mid 64's  . Heart disease Father   . Lung disease Father      spot on lung; had lung surgery  . Alcohol abuse Other   . Hypertension Son   . Diabetes Son   . Emphysema Maternal Grandfather   . Asthma Maternal Grandfather   . Colon cancer Paternal Grandfather   . Stomach cancer Paternal Grandmother   . Neuropathy Neg Hx     Review of Systems  Constitutional: Negative for chills and fever.  Eyes: Negative for visual disturbance.  Respiratory: Negative for cough, shortness of breath and wheezing.   Cardiovascular: Positive for leg swelling (mild). Negative for chest pain and palpitations.  Gastrointestinal: Positive for diarrhea (intermittent - from UC). Negative for abdominal pain, blood in stool and constipation.       Rare gerd  Genitourinary: Negative for dysuria and hematuria.  Musculoskeletal: Positive for arthralgias (knees) and back pain (lower back ).  Skin:       Dry patches  Neurological: Positive for light-headedness (occ) and numbness. Negative for headaches.  Psychiatric/Behavioral: Negative for dysphoric mood. The patient is not nervous/anxious.        Objective:   Vitals:   11/26/16 1451  BP: 138/68  Pulse: 72  Resp: 16  Temp: 97.9 F (36.6 C)   Filed Weights   11/26/16 1451  Weight: 217 lb (98.4 kg)   Body mass index is 39.69 kg/m.  Wt Readings from Last 3 Encounters:  11/26/16 217 lb (98.4 kg)  11/12/16 217 lb (98.4 kg)  10/28/16 217 lb (98.4 kg)     Physical Exam Constitutional: She appears well-developed and well-nourished. No distress.  HENT:  Head: Normocephalic and atraumatic.  Right Ear: External ear normal. Normal ear canal and TM Left Ear: External ear normal.  Normal ear canal and TM Mouth/Throat: Oropharynx is clear and moist.  Eyes: Conjunctivae and EOM are normal.  Neck: Neck supple. No tracheal deviation present. No thyromegaly present.  No carotid bruit  Cardiovascular: Normal rate, regular rhythm and normal heart sounds.   No murmur heard.  No edema.  Pulmonary/Chest: Effort normal and  breath sounds normal. No respiratory distress. She has no wheezes. She has no rales.  Breast: deferred to Gyn Abdominal: Soft. She exhibits no distension. There is no tenderness.  Lymphadenopathy: She has no cervical adenopathy.  Skin: Skin is warm and dry. She is not diaphoretic.  Psychiatric: She has a normal mood and affect. Her behavior is normal.       Assessment & Plan:   Physical exam: Screening blood work  ordered Immunizations pneumovax today, others up to date Colonoscopy  Up to date  Mammogram   Up to date  Gyn  Up to date  Dexa  - schedule. Eye exams Up to date  Exercise - minimal exercise - limited by knee pain Weight - obese - advised weight loss - has cut out bread Skin  No concerns Substance abuse   none  See Problem List for Assessment and Plan of chronic medical problems.  FU in 6 months

## 2016-11-26 NOTE — Assessment & Plan Note (Signed)
GERD controlled Continue daily medication  

## 2016-11-26 NOTE — Assessment & Plan Note (Signed)
Lab Results  Component Value Date   HGBA1C 6.9 09/05/2016   Well controlled Continue current medications

## 2016-11-26 NOTE — Assessment & Plan Note (Signed)
Increase gabapentin to 600 mg at night Can start a dose in the morning if she wants

## 2016-11-26 NOTE — Assessment & Plan Note (Signed)
BP well controlled Current regimen effective and well tolerated Continue current medications at current doses  

## 2016-11-26 NOTE — Progress Notes (Signed)
Pre visit review using our clinic review tool, if applicable. No additional management support is needed unless otherwise documented below in the visit note. 

## 2016-11-27 ENCOUNTER — Other Ambulatory Visit: Payer: Self-pay | Admitting: Internal Medicine

## 2016-11-29 NOTE — Progress Notes (Signed)
HPI: FU atrial fibrillation. Stress echocardiogram performed in September of 2011 was interpreted as normal. There was mention that LV function may not have improved as much as expected and if clinical suspicion high consider alternative imaging. Lexiscan Myoview in November of 2011 showed normal perfusion. Study not gated. CardioNet showed sinus with PVCs. Had EGD in November 2016 and developed atrial fibrillation. Placed on amiodarone and xarelto. Echocardiogram November 2016 showed normal LV function, moderate left ventricular hypertrophy, grade 1 diastolic dysfunction and mild left atrial enlargement. TSH November 2016 normal. Amiodarone DCed at previous ov. Since she was last seen, she denies dyspnea, chest pain, palpitations or syncope. She has had no further nosebleeds.  Current Outpatient Prescriptions  Medication Sig Dispense Refill  . azelastine (ASTELIN) 0.1 % nasal spray Place 2 sprays into both nostrils 2 (two) times daily. 30 mL 1  . Blood Glucose Monitoring Suppl (ONE TOUCH ULTRA MINI) w/Device KIT Use to check sugars, checks 4x daily. 1 each 0  . cromolyn (OPTICROM) 4 % ophthalmic solution Place 1 drop into both eyes as needed (allergies).     . Dulaglutide (TRULICITY) 1.5 CW/8.8QB SOPN Inject under skin 1.5 mg weekly under skin. 12 pen 1  . furosemide (LASIX) 40 MG tablet Take 1 tablet (40 mg total) by mouth daily. 90 tablet 3  . gabapentin (NEURONTIN) 600 MG tablet Take 1 tablet (600 mg total) by mouth at bedtime. 90 tablet 1  . glucose blood test strip Use as instructed to test blood sugar four times per day. DX E11.09 500 each 1  . inFLIXimab (REMICADE) 100 MG injection Inject into the vein every 8 (eight) weeks.     . Insulin Pen Needle 31G X 5 MM MISC Use to inject insulins equal to 6 times daily. 600 each 5  . insulin regular human CONCENTRATED (HUMULIN R U-500 KWIKPEN) 500 UNIT/ML kwikpen Inject 80-120 units 3x a day 12 pen 3  . lactase (LACTAID) 3000 UNITS tablet Take  3,000 Units by mouth as needed (when eating foods containing dairy).     Marland Kitchen loperamide (IMODIUM) 2 MG capsule Take 2 mg by mouth 3 (three) times daily as needed for diarrhea or loose stools.     . meclizine (ANTIVERT) 25 MG tablet Take 1 tablet (25 mg total) by mouth 3 (three) times daily as needed for dizziness or nausea. 90 tablet 0  . metoprolol succinate (TOPROL-XL) 100 MG 24 hr tablet Take 1 tablet (100 mg total) by mouth 2 (two) times daily. Take with or immediately following a meal. 90 tablet 3  . ONETOUCH DELICA LANCETS 16X MISC PATIENT USES ONE TOUCH DELICAL 45W LANCETS. Use as directed to check blood sugar up to 6 times per day. DX: E11.09 500 each 3  . pantoprazole (PROTONIX) 40 MG tablet Take 1 tablet (40 mg total) by mouth daily. 90 tablet 3  . potassium chloride (KLOR-CON M10) 10 MEQ tablet Take 2 tablets (20 mEq total) by mouth daily. 180 tablet 3  . rivaroxaban (XARELTO) 20 MG TABS tablet Take 1 tablet (20 mg total) by mouth daily with supper. 90 tablet 3  . Syringe/Needle, Disp, (SYRINGE 3CC/22GX1") 22G X 1" 3 ML MISC Use to inject Vit B12 once monthly. 25 each 0   No current facility-administered medications for this visit.      Past Medical History:  Diagnosis Date  . Anxiety   . 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 Complication of anesthesia   .  Dental bridge present    lower  . GERD   . History of thrombocytopenia 12/2011  . HYPERTENSION   . IDDM (insulin dependent diabetes mellitus) ([place])   . OSA (obstructive sleep apnea) 09/14/2015   sleep study 09/07/15: severe obstructive sleep apnea with an AHI of 72 and SaO2 low of 75%.>refer to sleep med for eval and tx options  . Osteoarthritis   . Ulcerative colitis ([place])    Remicade infusion Q 8 weeks    Past Surgical History:  Procedure Laterality Date  . ABDOMINAL HYSTERECTOMY  1980   partial  . BACTERIAL OVERGROWTH TEST N/A 07/13/2015   Procedure: BACTERIAL OVERGROWTH TEST;  Surgeon: Rogene Houston, MD;  [facility];  Service: Endoscopy;  Laterality: N/A;  730    . BILATERAL SALPINGOOPHORECTOMY  02/10/2001  . BREAST REDUCTION SURGERY  1994  . CARPAL TUNNEL RELEASE Right 1996  . CARPAL TUNNEL RELEASE Left 03/21/2003  . CARPAL TUNNEL RELEASE Right 05/04/2013   Procedure: CARPAL TUNNEL RELEASE;  Surgeon: Cammie Sickle., MD;  Location: [facility];  Service: Orthopedics;  Laterality: Right;  . CARPAL TUNNEL RELEASE Left 09/21/2013   Procedure: LEFT CARPAL TUNNEL RELEASE;  Surgeon: Cammie Sickle., MD;  Location: [place];  Service: Orthopedics;  Laterality: Left;  . CHOLECYSTECTOMY    . COLONOSCOPY WITH PROPOFOL N/A 08/04/2015   Procedure: COLONOSCOPY WITH PROPOFOL;  Surgeon: Rogene Houston, MD;  [facility];  Service: Endoscopy;  Laterality: N/A;  cecum time in  0820   time out  0827    total time 7 minutes  . ESOPHAGEAL DILATION N/A 08/04/2015   Procedure: ESOPHAGEAL DILATION;  Surgeon: Rogene Houston, MD;  [facility];  Service: Endoscopy;  Laterality: N/A;  [place] 57, no mucousal disruption  . ESOPHAGOGASTRODUODENOSCOPY (EGD) WITH ESOPHAGEAL DILATION  12/02/2005  . ESOPHAGOGASTRODUODENOSCOPY (EGD) WITH PROPOFOL N/A 08/04/2015   Procedure: ESOPHAGOGASTRODUODENOSCOPY (EGD) WITH PROPOFOL;  Surgeon: Rogene Houston, MD;  [facility];  Service: Endoscopy;  Laterality: N/A;  procedure 1  . FLEXIBLE SIGMOIDOSCOPY  01/17/2012   Procedure: FLEXIBLE SIGMOIDOSCOPY;  Surgeon: Rogene Houston, MD;  [facility];  Service: Endoscopy;  Laterality: N/A;  . HEMILAMINOTOMY LUMBAR SPINE Bilateral 09/07/1999   L4-5  . KNEE ARTHROSCOPY Right 01/1999; 10/2000  . LYSIS OF ADHESION  02/10/2001  . [place]; 09/12/2006  . TARSAL TUNNEL RELEASE  2002  . TUMOR EXCISION Left 03/21/2003   dorsal 1st web space (hand)  . URETEROLYSIS Right 02/10/2001    Social History   Social History  . Marital status: Married    Spouse  name: N/A  . Number of children: N/A  . Years of education: N/A   Occupational History  . Retired    Social History Main Topics  . Smoking status: Never Smoker  . Smokeless tobacco: Never Used  . Alcohol use 0.0 oz/week     Comment: very rare  . Drug use: No  . Sexual activity: Yes    Partners: Male    Birth control/ protection: Surgical     Comment: hysterectomy   Other Topics Concern  . Not on file   Social History Narrative   She lives with husband in two-story home.  They have one grown son and 2 grandchildren.   She is retired [employer].   Highest of level education:  Some college    Family History  Problem Relation Age of  Onset  . Diabetes Mother   . Hypertension Mother   . Heart attack Father     Mid 73's  . Heart disease Father   . Lung disease Father     spot on lung; had lung surgery  . Alcohol abuse Other   . Hypertension Son   . Diabetes Son   . Emphysema Maternal Grandfather   . Asthma Maternal Grandfather   . Colon cancer Paternal Grandfather   . Stomach cancer Paternal Grandmother   . Neuropathy Neg Hx     ROS: no fevers or chills, productive cough, hemoptysis, dysphasia, odynophagia, melena, hematochezia, dysuria, hematuria, rash, seizure activity, orthopnea, PND, pedal edema, claudication. Remaining systems are negative.  Physical Exam: Well-developed obese in no acute distress.  Skin is warm and dry.  HEENT is normal.  Neck is supple. No bruits Chest is clear to auscultation with normal expansion.  Cardiovascular exam is regular rate and rhythm.  Abdominal exam nontender or distended. No masses palpated. Extremities show no edema. neuro grossly intact  ECG-Sinus rhythm at a rate of 74. Anterior infarct cannot be excluded. personally reviewed  A/P  1 Paroxysmal atrial fibrillation-patient remains in sinus rhythm. We will continue with beta blocker for rate control if atrial fibrillation recurs. Continue xarelto. Check hemoglobin  and renal function.  2 Hypertension-blood pressure controlled. Continue present medications.  3 hyperlipidemia-management per primary care.  Kirk Ruths, MD

## 2016-12-03 ENCOUNTER — Encounter: Payer: Self-pay | Admitting: Cardiology

## 2016-12-03 ENCOUNTER — Ambulatory Visit (INDEPENDENT_AMBULATORY_CARE_PROVIDER_SITE_OTHER): Payer: Medicare Other | Admitting: Cardiology

## 2016-12-03 VITALS — BP 128/72 | HR 74 | Ht 62.0 in | Wt 217.8 lb

## 2016-12-03 DIAGNOSIS — I4891 Unspecified atrial fibrillation: Secondary | ICD-10-CM

## 2016-12-03 DIAGNOSIS — E7800 Pure hypercholesterolemia, unspecified: Secondary | ICD-10-CM

## 2016-12-03 DIAGNOSIS — I10 Essential (primary) hypertension: Secondary | ICD-10-CM

## 2016-12-03 NOTE — Patient Instructions (Signed)

## 2016-12-18 ENCOUNTER — Other Ambulatory Visit (INDEPENDENT_AMBULATORY_CARE_PROVIDER_SITE_OTHER): Payer: Medicare Other

## 2016-12-18 DIAGNOSIS — E1142 Type 2 diabetes mellitus with diabetic polyneuropathy: Secondary | ICD-10-CM

## 2016-12-18 DIAGNOSIS — E1165 Type 2 diabetes mellitus with hyperglycemia: Secondary | ICD-10-CM | POA: Diagnosis not present

## 2016-12-18 DIAGNOSIS — D729 Disorder of white blood cells, unspecified: Secondary | ICD-10-CM

## 2016-12-18 DIAGNOSIS — IMO0002 Reserved for concepts with insufficient information to code with codable children: Secondary | ICD-10-CM

## 2016-12-18 DIAGNOSIS — Z0000 Encounter for general adult medical examination without abnormal findings: Secondary | ICD-10-CM

## 2016-12-18 LAB — COMPREHENSIVE METABOLIC PANEL
ALBUMIN: 3.8 g/dL (ref 3.5–5.2)
ALT: 57 U/L — ABNORMAL HIGH (ref 0–35)
AST: 56 U/L — ABNORMAL HIGH (ref 0–37)
Alkaline Phosphatase: 70 U/L (ref 39–117)
BUN: 17 mg/dL (ref 6–23)
CALCIUM: 9.5 mg/dL (ref 8.4–10.5)
CHLORIDE: 105 meq/L (ref 96–112)
CO2: 29 meq/L (ref 19–32)
CREATININE: 0.91 mg/dL (ref 0.40–1.20)
GFR: 65.08 mL/min (ref >60.00)
Glucose, Bld: 180 mg/dL — ABNORMAL HIGH (ref 70–99)
POTASSIUM: 4.1 meq/L (ref 3.5–5.1)
Sodium: 137 mEq/L (ref 135–145)
Total Bilirubin: 0.4 mg/dL (ref 0.2–1.2)
Total Protein: 7.4 g/dL (ref 6.0–8.3)

## 2016-12-18 LAB — CBC WITH DIFFERENTIAL/PLATELET
BASOS PCT: 0.6 % (ref 0.0–3.0)
Basophils Absolute: 0 10*3/uL (ref 0.0–0.1)
EOS ABS: 0.1 10*3/uL (ref 0.0–0.7)
EOS PCT: 1.3 % (ref 0.0–5.0)
HEMATOCRIT: 40.8 % (ref 36.0–46.0)
Hemoglobin: 13.7 g/dL (ref 12.0–15.0)
Lymphs Abs: 3.9 10*3/uL (ref 0.7–4.0)
MCHC: 33.7 g/dL (ref 30.0–36.0)
MCV: 92.4 fl (ref 78.0–100.0)
MONOS PCT: 8.9 % (ref 3.0–12.0)
Monocytes Absolute: 0.6 10*3/uL (ref 0.1–1.0)
NEUTROS ABS: 2.4 10*3/uL (ref 1.4–7.7)
Neutrophils Relative %: 33.5 % — ABNORMAL LOW (ref 43.0–77.0)
Platelets: 187 10*3/uL (ref 150.0–400.0)
RBC: 4.41 Mil/uL (ref 3.87–5.11)
RDW: 13.9 % (ref 11.5–15.5)
WBC: 7.1 10*3/uL (ref 4.0–10.5)

## 2016-12-18 LAB — MICROALBUMIN / CREATININE URINE RATIO
Creatinine,U: 212.1 mg/dL
MICROALB UR: 1.9 mg/dL (ref 0.0–1.9)
Microalb Creat Ratio: 0.9 mg/g (ref 0.0–30.0)

## 2016-12-18 LAB — TSH: TSH: 1.01 u[IU]/mL (ref 0.35–4.50)

## 2016-12-19 LAB — PATHOLOGIST SMEAR REVIEW

## 2016-12-21 ENCOUNTER — Other Ambulatory Visit: Payer: Self-pay | Admitting: Internal Medicine

## 2016-12-21 DIAGNOSIS — R7989 Other specified abnormal findings of blood chemistry: Secondary | ICD-10-CM

## 2016-12-23 ENCOUNTER — Ambulatory Visit: Payer: Medicare Other | Admitting: Internal Medicine

## 2016-12-23 ENCOUNTER — Telehealth: Payer: Self-pay | Admitting: Internal Medicine

## 2016-12-23 NOTE — Telephone Encounter (Signed)
Spoke with pt to give results. Faxed results.

## 2016-12-23 NOTE — Telephone Encounter (Signed)
Pt called back regarding her lab results. Please call her when you have a chance. She also asked that her results be sent to Dr Kirk Ruths (Cardiology) and Dr Len Childs. Thanks [employer]

## 2016-12-24 ENCOUNTER — Telehealth (INDEPENDENT_AMBULATORY_CARE_PROVIDER_SITE_OTHER): Payer: Self-pay | Admitting: Internal Medicine

## 2016-12-24 NOTE — Telephone Encounter (Signed)
Patient called, stated that she had the CBC done at Dr. Celso Amy office and they called and stated that it was abnormal.  They want her to have it rechecked.  The patient is requesting that Dr. Laural [name] review this in her chart and then call her.  [phone]

## 2016-12-24 NOTE — Telephone Encounter (Signed)
I will address this with Dr.Rehman.

## 2016-12-25 NOTE — Telephone Encounter (Signed)
This was discussed with Dr.Rehman and he will review.

## 2016-12-26 ENCOUNTER — Other Ambulatory Visit (INDEPENDENT_AMBULATORY_CARE_PROVIDER_SITE_OTHER): Payer: Self-pay | Admitting: *Deleted

## 2016-12-26 ENCOUNTER — Encounter (INDEPENDENT_AMBULATORY_CARE_PROVIDER_SITE_OTHER): Payer: Self-pay | Admitting: *Deleted

## 2016-12-26 DIAGNOSIS — K5190 Ulcerative colitis, unspecified, without complications: Secondary | ICD-10-CM

## 2016-12-26 DIAGNOSIS — K760 Fatty (change of) liver, not elsewhere classified: Secondary | ICD-10-CM

## 2016-12-26 NOTE — Telephone Encounter (Signed)
Patient has been called and a letter has been sent as a reminder.

## 2016-12-26 NOTE — Telephone Encounter (Signed)
Lymphocytosis chronic and possibly related to Remicade. She has been evaluated by Dr. Leron Croak in the past. Mildly elevated transaminases appear to be due to fatty liver. Will check CBC with differential 2 days prior to next Remicade infusion. Check AST and ALT at that time to make sure it is stable and not trending upwards. Pleas call patient

## 2017-01-11 LAB — CBC WITH DIFFERENTIAL/PLATELET
BASOS ABS: 52 {cells}/uL (ref 0–200)
Basophils Relative: 1 %
EOS ABS: 104 {cells}/uL (ref 15–500)
EOS PCT: 2 %
HCT: 38.1 % (ref 35.0–45.0)
HEMOGLOBIN: 12.4 g/dL (ref 11.7–15.5)
LYMPHS ABS: 3120 {cells}/uL (ref 850–3900)
Lymphocytes Relative: 60 %
MCH: 30.5 pg (ref 27.0–33.0)
MCHC: 32.5 g/dL (ref 32.0–36.0)
MCV: 93.8 fL (ref 80.0–100.0)
MONOS PCT: 8 %
MPV: 10.5 fL (ref 7.5–12.5)
Monocytes Absolute: 416 cells/uL (ref 200–950)
NEUTROS PCT: 29 %
Neutro Abs: 1508 cells/uL (ref 1500–7800)
Platelets: 186 10*3/uL (ref 140–400)
RBC: 4.06 MIL/uL (ref 3.80–5.10)
RDW: 14.2 % (ref 11.0–15.0)
WBC: 5.2 10*3/uL (ref 3.8–10.8)

## 2017-01-11 LAB — AST: AST: 69 U/L — AB (ref 10–35)

## 2017-01-11 LAB — ALT: ALT: 53 U/L — ABNORMAL HIGH (ref 6–29)

## 2017-01-14 ENCOUNTER — Encounter (HOSPITAL_COMMUNITY): Payer: Self-pay

## 2017-01-14 ENCOUNTER — Encounter (HOSPITAL_COMMUNITY)
Admission: RE | Admit: 2017-01-14 | Discharge: 2017-01-14 | Disposition: A | Discharge status: Home / Self Care | Payer: Medicare Other | Source: Ambulatory Visit | Attending: Internal Medicine | Admitting: Internal Medicine

## 2017-01-14 DIAGNOSIS — K5190 Ulcerative colitis, unspecified, without complications: Secondary | ICD-10-CM | POA: Insufficient documentation

## 2017-01-14 MED ORDER — ACETAMINOPHEN 325 MG PO TABS
650.0000 mg | ORAL_TABLET | Freq: Once | ORAL | Status: AC
  Administered 2017-01-14: 650 mg via ORAL
  Filled 2017-01-14: qty 2

## 2017-01-14 MED ORDER — SODIUM CHLORIDE 0.9 % IV SOLN
INTRAVENOUS | Status: DC
  Administered 2017-01-14: 250 mL via INTRAVENOUS

## 2017-01-14 MED ORDER — SODIUM CHLORIDE 0.9 % IV SOLN
5.0000 mg/kg | Freq: Once | INTRAVENOUS | Status: AC
  Administered 2017-01-14: 500 mg via INTRAVENOUS
  Filled 2017-01-14: qty 50

## 2017-01-14 MED ORDER — LORATADINE 10 MG PO TABS
10.0000 mg | ORAL_TABLET | Freq: Once | ORAL | Status: AC
  Administered 2017-01-14: 10 mg via ORAL
  Filled 2017-01-14: qty 1

## 2017-01-17 ENCOUNTER — Other Ambulatory Visit: Payer: Self-pay | Admitting: Internal Medicine

## 2017-01-21 ENCOUNTER — Ambulatory Visit (INDEPENDENT_AMBULATORY_CARE_PROVIDER_SITE_OTHER): Payer: Medicare Other | Admitting: Internal Medicine

## 2017-01-21 ENCOUNTER — Encounter (INDEPENDENT_AMBULATORY_CARE_PROVIDER_SITE_OTHER): Payer: Self-pay | Admitting: Internal Medicine

## 2017-01-21 VITALS — BP 128/80 | HR 72 | Temp 97.3°F | Resp 18 | Ht 61.0 in | Wt 221.6 lb

## 2017-01-21 DIAGNOSIS — K219 Gastro-esophageal reflux disease without esophagitis: Secondary | ICD-10-CM | POA: Diagnosis not present

## 2017-01-21 DIAGNOSIS — K5100 Ulcerative (chronic) pancolitis without complications: Secondary | ICD-10-CM | POA: Diagnosis not present

## 2017-01-21 DIAGNOSIS — D72820 Lymphocytosis (symptomatic): Secondary | ICD-10-CM | POA: Diagnosis not present

## 2017-01-21 DIAGNOSIS — K760 Fatty (change of) liver, not elsewhere classified: Secondary | ICD-10-CM

## 2017-01-21 NOTE — Patient Instructions (Signed)
Repeat blood tests in 3 months.

## 2017-01-21 NOTE — Progress Notes (Signed)
Presenting complaint;  Follow-up for ulcerative colitis and GERD.  Database and Subjective:  [name] is 71 year old Caucasian female who has chronic ulcerative colitis GERD and history of fatty liver and is here for scheduled visit. She was last seen 6 months ago. She had blood work by Dr. Quay Burow recently and noted to have lymphocytosis. Therefore Dr. Quay Burow recommended further workup which she has already had by Dr. Tressie Stalker and Dr. Whitney Muse. It was felt that her lymphocytosis is secondary to infliximab.  She feels well. She has gained 5 pounds since her last visit. She says she has a voracious appetite and she just cannot control herself. She has anywhere from 1-4 stools per day. Most of her stools are formed. She takes Imodium only when she has to leave house and it is usually no more than 1 or 2 doses per week. She has mild pain below the right costal margin laterally 2-3 times in a month. This pain is fleeting and not associated with nausea or vomiting. She is having intermittent heartburn. She states pantoprazole does not work as well as Nexium did but Nexium is not preferred drug under her plan. She states she was placed on CPAP since her last visit. She is not able to do much exercise or walking on account of left plantar fasciitis.    Current Medications: Outpatient Encounter Prescriptions as of 01/21/2017  Medication Sig  . azelastine (ASTELIN) 0.1 % nasal spray Place 2 sprays into both nostrils 2 (two) times daily.  . Blood Glucose Monitoring Suppl (ONE TOUCH ULTRA MINI) w/Device KIT Use to check sugars, checks 4x daily.  . cromolyn (OPTICROM) 4 % ophthalmic solution Place 1 drop into both eyes as needed (allergies).   . Dulaglutide (TRULICITY) 1.5 MV/6.7MC SOPN Inject under skin 1.5 mg weekly under skin.  . furosemide (LASIX) 40 MG tablet Take 1 tablet (40 mg total) by mouth daily.  Marland Kitchen gabapentin (NEURONTIN) 600 MG tablet Take 1 tablet (600 mg total) by mouth at bedtime.  Marland Kitchen glucose  blood test strip Use as instructed to test blood sugar four times per day. DX E11.09  . inFLIXimab (REMICADE) 100 MG injection Inject into the vein every 8 (eight) weeks.   . Insulin Pen Needle 31G X 5 MM MISC Use to inject insulins equal to 6 times daily.  . insulin regular human CONCENTRATED (HUMULIN R U-500 KWIKPEN) 500 UNIT/ML kwikpen Inject 80-120 units 3x a day  . lactase (LACTAID) 3000 UNITS tablet Take 3,000 Units by mouth as needed (when eating foods containing dairy).   Marland Kitchen loperamide (IMODIUM) 2 MG capsule Take 2 mg by mouth 3 (three) times daily as needed for diarrhea or loose stools.   . meclizine (ANTIVERT) 25 MG tablet Take 1 tablet (25 mg total) by mouth 3 (three) times daily as needed for dizziness or nausea.  . metoprolol succinate (TOPROL-XL) 100 MG 24 hr tablet Take 1 tablet (100 mg total) by mouth 2 (two) times daily. Take with or immediately following a meal.  . ONETOUCH DELICA LANCETS 94B MISC PATIENT USES ONE TOUCH DELICAL 09G LANCETS. Use as directed to check blood sugar up to 6 times per day. DX: E11.09  . pantoprazole (PROTONIX) 40 MG tablet Take 1 tablet (40 mg total) by mouth daily.  . potassium chloride (KLOR-CON M10) 10 MEQ tablet Take 2 tablets (20 mEq total) by mouth daily.  . rivaroxaban (XARELTO) 20 MG TABS tablet Take 1 tablet (20 mg total) by mouth daily with supper.  . vitamin B-12 (CYANOCOBALAMIN)  1000 MCG tablet Take 1,000 mcg by mouth daily.  . [DISCONTINUED] Syringe/Needle, Disp, (SYRINGE 3CC/22GX1") 22G X 1" 3 ML MISC Use to inject Vit B12 once monthly. (Patient not taking: Reported on 01/21/2017)   No facility-administered encounter medications on file as of 01/21/2017.      Objective: Blood pressure 128/80, pulse 72, temperature 97.3 F (36.3 C), temperature source Oral, resp. rate 18, height 5' 1"  (1.549 m), weight 221 lb 9.6 oz (100.5 kg). Patient is alert and in no acute distress. Conjunctiva is pink. Sclera is nonicteric Oropharyngeal mucosa is  normal. No neck masses or thyromegaly noted. Cardiac exam with regular rhythm normal S1 and S2. No murmur or gallop noted. Lungs are clear to auscultation. Abdomen is obese but soft and nontender without organomegaly or masses. Trace edema around ankles.  Labs/studies Results: Lab data from 01/10/2017  WBC 5.2 H&H 12.4 and 38.1 and platelet count 186K  Differential reveals 29% neutrophils and 60% lymphocytes  ANC 1508 and absolute lymphocyte count is 3120.   AST 69.  ALT 53   Assessment:  #1. Chronic ulcerative colitis. She remains in remission on single agent. Last sigmoidoscopy was 5 years ago. She will continue infliximab as long as it is working and she is not having clinical sequelae.  #2. Weight gain. She has scant 5 pounds since her last visit 6 months ago but she has gained 37 pounds in last 5 years. Weight gain appears to be due to increase calorie intake and insulin. Her inability to exercise or walk does not help either. Her BMI is 41.9. She has seen dietitian in the past but it has not made any difference. She may be a candidate for bariatric surgery.  #3. 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 He had workup by oncologist at [facility] and studies negative for CLL. No further workup at this time other than observation.  #4. GERD. Symptom control fair with current PPI.  #5. Mildly elevated transaminases secondary to fatty liver. No CT evidence of cirrhosis on study of September 2015.   Plan:  Patient encouraged to exercise or walk as much as possible. Will confer with Dr. Johnathan Hausen or associates if patient reasonable candidate for bariatric surgery. Patient will have CBC with differential and LFTs in 3 months. Surveillance colonoscopy following next visit. Office visit in 6 months.

## 2017-01-22 ENCOUNTER — Other Ambulatory Visit (INDEPENDENT_AMBULATORY_CARE_PROVIDER_SITE_OTHER): Payer: Self-pay | Admitting: *Deleted

## 2017-01-22 DIAGNOSIS — K5180 Other ulcerative colitis without complications: Secondary | ICD-10-CM

## 2017-01-22 DIAGNOSIS — K760 Fatty (change of) liver, not elsewhere classified: Secondary | ICD-10-CM

## 2017-01-24 ENCOUNTER — Other Ambulatory Visit: Payer: Self-pay | Admitting: *Deleted

## 2017-01-24 MED ORDER — FUROSEMIDE 40 MG PO TABS: 40.0000 mg | ORAL_TABLET | Freq: Every day | ORAL | 2 refills | Status: DC

## 2017-01-24 NOTE — Telephone Encounter (Signed)
Rec'd call pt states pharmacy states they never received script for furosemide. Inform will send to [facility] in [place]...[name]

## 2017-01-27 ENCOUNTER — Other Ambulatory Visit: Payer: Self-pay | Admitting: Internal Medicine

## 2017-02-10 ENCOUNTER — Telehealth: Payer: Self-pay | Admitting: Internal Medicine

## 2017-02-10 DIAGNOSIS — K5100 Ulcerative (chronic) pancolitis without complications: Secondary | ICD-10-CM

## 2017-02-10 DIAGNOSIS — M2550 Pain in unspecified joint: Secondary | ICD-10-CM

## 2017-02-10 NOTE — Telephone Encounter (Signed)
Pt called in and would like Dr burns to put in a referral for her to see Dr Amil Amen in rheumatology.  She was getting some treatments at the hospital but they told her she could get a referral to Rheumatology and she could have them done there

## 2017-03-11 ENCOUNTER — Telehealth: Payer: Self-pay | Admitting: Emergency Medicine

## 2017-03-11 NOTE — Telephone Encounter (Signed)
Received paperwork for disability from pt. Per Dr Quay Burow this should be filled out by our office not by the pt. LVM informing pt that next time it was needing to be re-certed we would need to fill out. No charge sheet attached per Dr Quay Burow. Mailed to addressed envelope given by pt.

## 2017-03-18 ENCOUNTER — Telehealth: Payer: Self-pay | Admitting: Internal Medicine

## 2017-03-18 ENCOUNTER — Encounter (HOSPITAL_COMMUNITY): Payer: Medicare Other

## 2017-03-18 NOTE — Telephone Encounter (Signed)
Pt states she is unable to take Tessalon. Appt scheduled for tomorrow at 345

## 2017-03-18 NOTE — Telephone Encounter (Signed)
We can send in tessalon perles. Anything with codeine or hydrocodone needs to be picked up so she should be seen for those.

## 2017-03-18 NOTE — Telephone Encounter (Signed)
Pt called in said that she has woke up with a cough this morning and would like to know if Dr burns would call in cough meds in for her.  She said that she live an hour a way?  She would like like someone to call her and let her know

## 2017-03-19 ENCOUNTER — Ambulatory Visit: Payer: Medicare Other | Admitting: Internal Medicine

## 2017-03-21 ENCOUNTER — Ambulatory Visit (INDEPENDENT_AMBULATORY_CARE_PROVIDER_SITE_OTHER): Payer: Medicare Other | Admitting: Family Medicine

## 2017-03-21 ENCOUNTER — Encounter: Payer: Self-pay | Admitting: Family Medicine

## 2017-03-21 ENCOUNTER — Telehealth: Payer: Self-pay | Admitting: Family Medicine

## 2017-03-21 VITALS — BP 121/77 | HR 88 | Temp 98.5°F | Resp 16 | Wt 217.0 lb

## 2017-03-21 DIAGNOSIS — J209 Acute bronchitis, unspecified: Secondary | ICD-10-CM | POA: Diagnosis not present

## 2017-03-21 DIAGNOSIS — B349 Viral infection, unspecified: Secondary | ICD-10-CM | POA: Diagnosis not present

## 2017-03-21 LAB — POC INFLUENZA A&B (BINAX/QUICKVUE)
INFLUENZA A, POC: NEGATIVE
INFLUENZA B, POC: NEGATIVE

## 2017-03-21 MED ORDER — AZITHROMYCIN 250 MG PO TABS: ORAL_TABLET | ORAL | 0 refills | Status: DC

## 2017-03-21 MED ORDER — HYDROCODONE-HOMATROPINE 5-1.5 MG/5ML PO SYRP: ORAL_SOLUTION | ORAL | 0 refills | Status: DC

## 2017-03-21 NOTE — Patient Instructions (Signed)
Get otc generic robitussin DM OR Mucinex DM and use as directed on the packaging for cough and congestion. Use otc generic saline nasal spray 2-3 times per day to irrigate/moisturize your nasal passages.   

## 2017-03-21 NOTE — Telephone Encounter (Signed)
Patient would like to transfer to Dr Anitra Lauth from Dr Quay Burow due to location.  Okay to transfer?

## 2017-03-21 NOTE — Telephone Encounter (Signed)
Ok with me 

## 2017-03-21 NOTE — Telephone Encounter (Signed)
OK with me.

## 2017-03-21 NOTE — Progress Notes (Signed)
OFFICE VISIT  03/21/2017   CC:  Chief Complaint  Patient presents with  . Sore Throat    sinus drainage, and fever   HPI:    Patient is a 71 y.o. Caucasian female who presents for throat pain. Onset of scratchy throat, cough, hoarse voice, ears achy, coughing up yellow mucous--about 4 d/a.  Feels subjective f/c.  Tm when temp measured has been 99.9. Sx's worsening last 48h.  No known sick contacts.  No SOB, no wheezing.  Some nausea, vomited x 1 two days ago.  No diarrhea.  No rash.  Mild HA.  No face or upper teeth pain.  Mild generalized achiness.  This is the first illness she has had since being on remicade (has been on this med x 5-6 yrs). Remicade: most recent infusion was 9 d/a.  Past Medical History:  Diagnosis Date  . Anxiety   . 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 Dental bridge present    lower  . GERD   . History of thrombocytopenia 12/2011  . HYPERTENSION   . IDDM (insulin dependent diabetes mellitus) ([place])   . OSA (obstructive sleep apnea) 09/14/2015   sleep study 09/07/15: severe obstructive sleep apnea with an AHI of 72 and SaO2 low of 75%.>refer to sleep med for eval and tx options  . Osteoarthritis   . PAF (paroxysmal atrial fibrillation) ([place])   . Ulcerative colitis ([place])    Remicade infusion Q 8 weeks    Past Surgical History:  Procedure Laterality Date  . ABDOMINAL HYSTERECTOMY  1980   partial  . BACTERIAL OVERGROWTH TEST N/A 07/13/2015   Procedure: BACTERIAL OVERGROWTH TEST;  Surgeon: Rogene Houston, MD;  [facility];  Service: Endoscopy;  Laterality: N/A;  730    . BILATERAL SALPINGOOPHORECTOMY  02/10/2001  . BREAST REDUCTION SURGERY  1994  . CARPAL TUNNEL RELEASE Right 1996  . CARPAL TUNNEL RELEASE Left 03/21/2003  . CARPAL TUNNEL RELEASE Right 05/04/2013   Procedure: CARPAL TUNNEL RELEASE;  Surgeon: Cammie Sickle., MD;  Location: [place];  Service: Orthopedics;  Laterality: Right;  .  CARPAL TUNNEL RELEASE Left 09/21/2013   Procedure: LEFT CARPAL TUNNEL RELEASE;  Surgeon: Cammie Sickle., MD;  Location: [place];  Service: Orthopedics;  Laterality: Left;  . CHOLECYSTECTOMY    . COLONOSCOPY WITH PROPOFOL N/A 08/04/2015   Procedure: COLONOSCOPY WITH PROPOFOL;  Surgeon: Rogene Houston, MD;  Location: [facility];  Service: Endoscopy;  Laterality: N/A;  cecum time in  0820   time out  0827    total time 7 minutes  . ESOPHAGEAL DILATION N/A 08/04/2015   Procedure: ESOPHAGEAL DILATION;  Surgeon: Rogene Houston, MD;  Location: [facility];  Service: Endoscopy;  Laterality: N/A;  [place] 57, no mucousal disruption  . ESOPHAGOGASTRODUODENOSCOPY (EGD) WITH ESOPHAGEAL DILATION  12/02/2005  . ESOPHAGOGASTRODUODENOSCOPY (EGD) WITH PROPOFOL N/A 08/04/2015   Procedure: ESOPHAGOGASTRODUODENOSCOPY (EGD) WITH PROPOFOL;  Surgeon: Rogene Houston, MD;  Location: [facility];  Service: Endoscopy;  Laterality: N/A;  procedure 1  . FLEXIBLE SIGMOIDOSCOPY  01/17/2012   Procedure: FLEXIBLE SIGMOIDOSCOPY;  Surgeon: Rogene Houston, MD;  [facility];  Service: Endoscopy;  Laterality: N/A;  . HEMILAMINOTOMY LUMBAR SPINE Bilateral 09/07/1999   L4-5  . KNEE ARTHROSCOPY Right 01/1999; 10/2000  . LYSIS OF ADHESION  02/10/2001  . [place]; 09/12/2006  . TARSAL TUNNEL RELEASE  2002  . TUMOR EXCISION  Left 03/21/2003   dorsal 1st web space (hand)  . URETEROLYSIS Right 02/10/2001    Outpatient Medications Prior to Visit  Medication Sig Dispense Refill  . azelastine (ASTELIN) 0.1 % nasal spray Place 2 sprays into both nostrils 2 (two) times daily. 30 mL 1  . Blood Glucose Monitoring Suppl (ONE TOUCH ULTRA MINI) w/Device KIT Use to check sugars, checks 4x daily. 1 each 0  . cromolyn (OPTICROM) 4 % ophthalmic solution Place 1 drop into both eyes as needed (allergies).     . Dulaglutide (TRULICITY) 1.5 OJ/5.0KX SOPN Inject under skin 1.5 mg weekly under skin. 12 pen 1  . furosemide  (LASIX) 40 MG tablet Take 1 tablet (40 mg total) by mouth daily. 90 tablet 2  . gabapentin (NEURONTIN) 600 MG tablet Take 1 tablet (600 mg total) by mouth at bedtime. 90 tablet 1  . glucose blood test strip Use as instructed to test blood sugar four times per day. DX E11.09 500 each 1  . inFLIXimab (REMICADE) 100 MG injection Inject into the vein every 8 (eight) weeks.     . Insulin Pen Needle 31G X 5 MM MISC Use to inject insulins equal to 6 times daily. 600 each 5  . insulin regular human CONCENTRATED (HUMULIN R U-500 KWIKPEN) 500 UNIT/ML kwikpen Inject 80-120 units 3x a day 12 pen 3  . lactase (LACTAID) 3000 UNITS tablet Take 3,000 Units by mouth as needed (when eating foods containing dairy).     Marland Kitchen loperamide (IMODIUM) 2 MG capsule Take 2 mg by mouth 3 (three) times daily as needed for diarrhea or loose stools.     . meclizine (ANTIVERT) 25 MG tablet Take 1 tablet (25 mg total) by mouth 3 (three) times daily as needed for dizziness or nausea. 90 tablet 0  . metoprolol succinate (TOPROL-XL) 100 MG 24 hr tablet Take 1 tablet (100 mg total) by mouth 2 (two) times daily. Take with or immediately following a meal. 90 tablet 3  . ONETOUCH DELICA LANCETS 38H MISC PATIENT USES ONE TOUCH DELICAL 82X LANCETS. Use as directed to check blood sugar up to 6 times per day. DX: E11.09 500 each 3  . potassium chloride (KLOR-CON M10) 10 MEQ tablet Take 2 tablets (20 mEq total) by mouth daily. 180 tablet 3  . rivaroxaban (XARELTO) 20 MG TABS tablet Take 1 tablet (20 mg total) by mouth daily with supper. 90 tablet 3  . vitamin B-12 (CYANOCOBALAMIN) 1000 MCG tablet Take 1,000 mcg by mouth daily.    . pantoprazole (PROTONIX) 40 MG tablet Take 1 tablet (40 mg total) by mouth daily. (Patient not taking: Reported on 03/21/2017) 90 tablet 3   No facility-administered medications prior to visit.     Allergies  Allergen Reactions  . Actos [Pioglitazone] Other (See Comments)    Weight gain  . Benzocaine-Menthol  Swelling    SWELLING OF MOUTH  . Colesevelam Other (See Comments)    GI UPSET  . Flagyl [Metronidazole Hcl] Other (See Comments)    DIAPHORESIS  . Metformin And Related Diarrhea  . Omeprazole Swelling    SWELLING OF TONGUE AND THROAT  . Shrimp [Shellfish Allergy] Itching    OF THROAT AND EARS  . Statins Other (See Comments)    HEART RACING  . Desipramine Hcl Itching, Nausea Only and Other (See Comments)    "swimmy" headed, ears itched   . Hydromorphone Itching  . Jardiance [Empagliflozin] Other (See Comments)    weakness  . Adhesive [Tape] Other (See  Comments)    SKIN IRRITATION AND BRUISING  . Nisoldipine Itching  . Percocet [Oxycodone-Acetaminophen] Itching    ROS As per HPI  PE: Blood pressure 121/77, pulse 88, temperature 98.5 F (36.9 C), temperature source Oral, resp. rate 16, weight 217 lb (98.4 kg), SpO2 94 %. VS: noted--normal. Gen: alert, NAD, NONTOXIC APPEARING. HEENT: eyes without injection, drainage, or swelling.  Ears: EACs clear, TMs with normal light reflex and landmarks.  Nose: Clear rhinorrhea, with some dried, crusty exudate adherent to mildly injected mucosa.  No purulent d/c.  No paranasal sinus TTP.  No facial swelling.  Throat and mouth without focal lesion.  No pharyngial swelling, erythema, or exudate.   Neck: supple, no LAD.   LUNGS: CTA bilat, nonlabored resps.   CV: RRR, no m/r/g. EXT: no c/c/e SKIN: no rash  LABS:    Chemistry      Component Value Date/Time   NA 137 12/18/2016 1455   K 4.1 12/18/2016 1455   CL 105 12/18/2016 1455   CO2 29 12/18/2016 1455   BUN 17 12/18/2016 1455   CREATININE 0.91 12/18/2016 1455   CREATININE 0.95 11/03/2015 1625      Component Value Date/Time   CALCIUM 9.5 12/18/2016 1455   ALKPHOS 70 12/18/2016 1455   AST 69 (H) 01/10/2017 1333   ALT 53 (H) 01/10/2017 1333   BILITOT 0.4 12/18/2016 1455     Rapid flu A/B today: NEGATIVE  IMPRESSION AND PLAN:  Viral syndrome/bronchitis. Given the fact that  symptoms are worsening the last 48h and she is relatively immunosuppressed from her remicade, I will treat empirically with azithromycin x 5d for possible bacterial bronchitis. Hycodan syrup: 1-2 tsp po qhs prn cough, #120 ml. Get otc generic robitussin DM OR Mucinex DM and use as directed on the packaging for cough and congestion. Use otc generic saline nasal spray 2-3 times per day to irrigate/moisturize your nasal passages.  An After Visit Summary was printed and given to the patient.  FOLLOW UP: Return if symptoms worsen or fail to improve.  Signed:  Crissie Sickles, MD           03/21/2017

## 2017-03-24 NOTE — Telephone Encounter (Signed)
Patient scheduled appt 06/25/17 @ 1pm for transfer of care and follow up of DM and HTN.

## 2017-03-26 ENCOUNTER — Other Ambulatory Visit (INDEPENDENT_AMBULATORY_CARE_PROVIDER_SITE_OTHER): Payer: Self-pay | Admitting: *Deleted

## 2017-03-26 ENCOUNTER — Encounter (INDEPENDENT_AMBULATORY_CARE_PROVIDER_SITE_OTHER): Payer: Self-pay | Admitting: *Deleted

## 2017-03-26 ENCOUNTER — Other Ambulatory Visit: Payer: Self-pay | Admitting: *Deleted

## 2017-03-26 DIAGNOSIS — K760 Fatty (change of) liver, not elsewhere classified: Secondary | ICD-10-CM

## 2017-03-26 DIAGNOSIS — K5180 Other ulcerative colitis without complications: Secondary | ICD-10-CM

## 2017-03-26 MED ORDER — HYDROCODONE-HOMATROPINE 5-1.5 MG/5ML PO SYRP: ORAL_SOLUTION | ORAL | 0 refills | Status: DC

## 2017-03-26 NOTE — Telephone Encounter (Signed)
I'll authorize RF x 1, but emphasize that this cough med is to be used only at bedtime if needed.  Use otc robitussin or mucinex DM for daytime cough med.

## 2017-03-26 NOTE — Telephone Encounter (Signed)
Pts husband called requesting refill for cough medication Hycodan. I advised pts husband (okay per DPR) that Rx was given to pt on 03/21/17 for a 12 days supply, it has only been 5 days. He stated that she only has one dose left. I asked pts husband if she had tried the otc cough medication recommended at her office visit he stated that he didn't know but will ask her. I advised him that I would send the request to Dr. Anitra Lauth but that it would most likely be denied. He voiced understanding. Please advise. Thanks.

## 2017-03-27 NOTE — Telephone Encounter (Signed)
Pts husband advised and voiced understanding, okay per DPR.

## 2017-05-07 ENCOUNTER — Other Ambulatory Visit (INDEPENDENT_AMBULATORY_CARE_PROVIDER_SITE_OTHER): Payer: Self-pay | Admitting: *Deleted

## 2017-05-07 DIAGNOSIS — K5190 Ulcerative colitis, unspecified, without complications: Secondary | ICD-10-CM

## 2017-05-07 DIAGNOSIS — K760 Fatty (change of) liver, not elsewhere classified: Secondary | ICD-10-CM

## 2017-05-08 ENCOUNTER — Telehealth (INDEPENDENT_AMBULATORY_CARE_PROVIDER_SITE_OTHER): Payer: Self-pay | Admitting: Internal Medicine

## 2017-05-08 ENCOUNTER — Other Ambulatory Visit (INDEPENDENT_AMBULATORY_CARE_PROVIDER_SITE_OTHER): Payer: Self-pay | Admitting: *Deleted

## 2017-05-08 DIAGNOSIS — K760 Fatty (change of) liver, not elsewhere classified: Secondary | ICD-10-CM

## 2017-05-08 DIAGNOSIS — R748 Abnormal levels of other serum enzymes: Secondary | ICD-10-CM

## 2017-05-08 NOTE — Telephone Encounter (Signed)
Lab is noted for 4 weeks, the patient will be sent a letter as a reminder.

## 2017-05-08 NOTE — Telephone Encounter (Signed)
Lab results received from Dr. Melissa Noon office. These results were reviewed with patient over the phone. AST and ALT are harder than they have been. Patient denies abdominal pain. She is not taking any new medications and she exercising daily. She has fatty liver. Will repeat LFTs in 4 weeks and determine need for further workup.

## 2017-05-11 ENCOUNTER — Other Ambulatory Visit: Payer: Self-pay | Admitting: Internal Medicine

## 2017-05-16 ENCOUNTER — Encounter (INDEPENDENT_AMBULATORY_CARE_PROVIDER_SITE_OTHER): Payer: Self-pay | Admitting: *Deleted

## 2017-05-16 ENCOUNTER — Other Ambulatory Visit (INDEPENDENT_AMBULATORY_CARE_PROVIDER_SITE_OTHER): Payer: Self-pay | Admitting: *Deleted

## 2017-05-16 DIAGNOSIS — K760 Fatty (change of) liver, not elsewhere classified: Secondary | ICD-10-CM

## 2017-05-16 DIAGNOSIS — R748 Abnormal levels of other serum enzymes: Secondary | ICD-10-CM

## 2017-05-19 ENCOUNTER — Other Ambulatory Visit: Payer: Self-pay | Admitting: *Deleted

## 2017-05-19 MED ORDER — GABAPENTIN 600 MG PO TABS: ORAL_TABLET | ORAL | 1 refills | Status: DC

## 2017-05-19 NOTE — Telephone Encounter (Signed)
Received refill request from pts pharmacy [facility] requesting new Rx for gabapentin 384m BID. SW pt and she stated that she has the 6060mtablets that she has been breaking in half and taking one half in the morning and the other half in the evening. She did mention that this does doesn't seem to be helping, she still has the burning and tingling in her hands. She stated that she has an appointment on 06/25/17. She wants to know if this can be changed now or does this need to wait til her appointment. Please advise. Thanks.

## 2017-05-19 NOTE — Telephone Encounter (Signed)
Left message for pt to call back  °

## 2017-05-19 NOTE — Telephone Encounter (Signed)
I'll send in the rx to read "1 tab twice a day" with the 600 mg tabs.

## 2017-05-22 NOTE — Telephone Encounter (Signed)
Pt advised and voiced understanding.   

## 2017-05-22 NOTE — Telephone Encounter (Signed)
Left message for pt to call back  °

## 2017-06-16 ENCOUNTER — Telehealth: Payer: Self-pay | Admitting: Pulmonary Disease

## 2017-06-16 DIAGNOSIS — G4733 Obstructive sleep apnea (adult) (pediatric): Secondary | ICD-10-CM

## 2017-06-16 NOTE — Telephone Encounter (Signed)
Spoke with patient. She is wanting to change CPAP masks due to discomfort. She had a mask in mind that she wanted but asked if I knew of any good masks. Advised patient that I did not but I could put in the order for [facility] to contact her for a mask fit session. She stated that she would like that instead. Will go ahead and place order. She is aware. Nothing else needed at time of call.

## 2017-06-17 LAB — HM DIABETES EYE EXAM

## 2017-06-19 ENCOUNTER — Encounter: Payer: Self-pay | Admitting: Family Medicine

## 2017-06-23 ENCOUNTER — Other Ambulatory Visit: Payer: Self-pay | Admitting: Cardiology

## 2017-06-24 ENCOUNTER — Ambulatory Visit: Payer: Medicare Other | Admitting: Internal Medicine

## 2017-06-25 ENCOUNTER — Ambulatory Visit (INDEPENDENT_AMBULATORY_CARE_PROVIDER_SITE_OTHER): Payer: Medicare Other | Admitting: Family Medicine

## 2017-06-25 ENCOUNTER — Encounter: Payer: Self-pay | Admitting: Family Medicine

## 2017-06-25 VITALS — BP 117/77 | HR 70 | Temp 98.5°F | Resp 16 | Ht 61.0 in | Wt 218.2 lb

## 2017-06-25 DIAGNOSIS — I10 Essential (primary) hypertension: Secondary | ICD-10-CM | POA: Diagnosis not present

## 2017-06-25 DIAGNOSIS — Z794 Long term (current) use of insulin: Secondary | ICD-10-CM

## 2017-06-25 DIAGNOSIS — E118 Type 2 diabetes mellitus with unspecified complications: Secondary | ICD-10-CM

## 2017-06-25 DIAGNOSIS — R740 Nonspecific elevation of levels of transaminase and lactic acid dehydrogenase [LDH]: Secondary | ICD-10-CM | POA: Diagnosis not present

## 2017-06-25 DIAGNOSIS — R7401 Elevation of levels of liver transaminase levels: Secondary | ICD-10-CM

## 2017-06-25 LAB — COMPREHENSIVE METABOLIC PANEL
ALK PHOS: 76 U/L (ref 39–117)
ALT: 118 U/L — ABNORMAL HIGH (ref 0–35)
AST: 161 U/L — ABNORMAL HIGH (ref 0–37)
Albumin: 3.8 g/dL (ref 3.5–5.2)
BUN: 11 mg/dL (ref 6–23)
CALCIUM: 9.4 mg/dL (ref 8.4–10.5)
CO2: 30 mEq/L (ref 19–32)
Chloride: 100 mEq/L (ref 96–112)
Creatinine, Ser: 0.68 mg/dL (ref 0.40–1.20)
GFR: 90.96 mL/min (ref >60.00)
GLUCOSE: 189 mg/dL — AB (ref 70–99)
POTASSIUM: 4.1 meq/L (ref 3.5–5.1)
Sodium: 137 mEq/L (ref 135–145)
TOTAL PROTEIN: 7.3 g/dL (ref 6.0–8.3)
Total Bilirubin: 0.6 mg/dL (ref 0.2–1.2)

## 2017-06-25 LAB — HEMOGLOBIN A1C: HEMOGLOBIN A1C: 7.4 % — AB (ref 4.6–6.5)

## 2017-06-25 NOTE — Patient Instructions (Signed)
Continue trulicity.  Continue 100 units of U500 R at supper.  Limit bedtime U500 dose to 30 U if glucose >200 at that time. Increase U500 at breakfast to 105 units. Increase U500 at Lunch to 110 Units.

## 2017-06-25 NOTE — Progress Notes (Signed)
Office Note 06/25/2017  CC:  Chief Complaint  Patient presents with  . Establish Care  . Follow-up    DM and HTN   HPI:  [name] is a 71 y.o. White female who is here to transfer care, was seeing Dr. Quay Burow at [facility] on [address]. Old records in [facility] EMR were reviewed prior to or during today's visit. It appears from EMR review that she had been seeing Dr. Cruzita Lederer for DM, last seen by her 10/28/16, was supposed to f/u 6 wks later but she did not do this.  She wants me to take over mgmt of her DM.  Most recent CPE was done 11/26/16 by Dr. Quay Burow.  At that time, vaccines, cancer screenings, labs all UTD.  Bone density test was ordered but has not been done.  DM 2:Taking trulicity 0.9WJ q week and R-U500--taking 100 tid and if hs > 200, then she takes 30-45 U of U500. Reviewed home glucoses; mornings avg 130-140 but range from 62-212.  Mid day gluc's: 200 or so, but again, large variation.  2 H PP supper and also hs checks usually 200s but again with wide variation at times. Pt states no variation in diet or activity level explains her glucose variations.  She feels hypoglycemic at gluc <100.  Obesity: pt states she is frustrated with inability to lose wt.    HTN: no home monitoring "b/c my bp usually good".  Taking metoprolol 16m bid.  Followed by Dr. CStanford Breedfor her a-fib.  Only rarely feels a brief heart "flutter", mainly when her glucose is high.    Past Medical History:  Diagnosis Date  . Anxiety   . 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 GERD   . Hepatic steatosis   . History of thrombocytopenia 12/2011  . Hyperlipidemia    Intolerant of statins  . HYPERTENSION   . IDDM (insulin dependent diabetes mellitus) ([place]    with DPN (managed by Dr. GCruzita Lederer  . Morbid obesity ([place]   . OSA (obstructive sleep apnea) 09/14/2015   sleep study 09/07/15: severe obstructive sleep apnea with an AHI of 72 and SaO2 low of 75%.>refer to sleep med for eval  and tx options  . Osteoarthritis   . PAF (paroxysmal atrial fibrillation) ([facility]    One documented episode (after getting EGD 2016).  Was on amiodarone x 3 mo.  Rate control with metoprolol + anticoag with xarelto.   . Small fiber neuropathy    Due to DM.  Symmetric hands and feet tingling/numbness.  .Marland KitchenUlcerative colitis ([place]    Remicade infusion Q 8 weeks    Past Surgical History:  Procedure Laterality Date  . ABDOMINAL HYSTERECTOMY  1980   partial  . BACTERIAL OVERGROWTH TEST N/A 07/13/2015   Procedure: BACTERIAL OVERGROWTH TEST;  Surgeon: NRogene Houston MD;  [facility];  Service: Endoscopy;  Laterality: N/A;  730    . BILATERAL SALPINGOOPHORECTOMY  02/10/2001  . BREAST REDUCTION SURGERY  1994  . CARDIOVASCULAR STRESS TEST  07/2010   Lexiscan myoview: normal  . CARPAL TUNNEL RELEASE Right 1996  . CARPAL TUNNEL RELEASE Left 03/21/2003  . CARPAL TUNNEL RELEASE Right 05/04/2013   Procedure: CARPAL TUNNEL RELEASE;  Surgeon: RCammie Sickle, MD;  [facility]  Service: Orthopedics;  Laterality: Right;  . CARPAL TUNNEL RELEASE Left 09/21/2013   Procedure: LEFT CARPAL TUNNEL RELEASE;  Surgeon: RCammie Sickle, MD;  [facility];  Service: Orthopedics;  Laterality: Left;  . CHOLECYSTECTOMY    . COLONOSCOPY WITH PROPOFOL N/A 08/04/2015   Colitis in remission.  No polyps.  Procedure: COLONOSCOPY WITH PROPOFOL;  Surgeon: Rogene Houston, MD;  [facility];  Service: Endoscopy;  Laterality: N/A;  cecum time in  0820   time out  0827    total time 7 minutes  . ESOPHAGEAL DILATION N/A 08/04/2015   Procedure: ESOPHAGEAL DILATION;  Surgeon: Rogene Houston, MD;  [facility];  Service: Endoscopy;  Laterality: N/A;  Maloney 57, no mucousal disruption  . ESOPHAGOGASTRODUODENOSCOPY (EGD) WITH ESOPHAGEAL DILATION  12/02/2005  . ESOPHAGOGASTRODUODENOSCOPY (EGD) WITH PROPOFOL N/A 08/04/2015   Procedure: ESOPHAGOGASTRODUODENOSCOPY  (EGD) WITH PROPOFOL;  Surgeon: Rogene Houston, MD;  [facility];  Service: Endoscopy;  Laterality: N/A;  procedure 1  . FLEXIBLE SIGMOIDOSCOPY  01/17/2012   Procedure: FLEXIBLE SIGMOIDOSCOPY;  Surgeon: Rogene Houston, MD;  [facility];  Service: Endoscopy;  Laterality: N/A;  . HEMILAMINOTOMY LUMBAR SPINE Bilateral 09/07/1999   L4-5  . KNEE ARTHROSCOPY Right 01/1999; 10/2000  . LYSIS OF ADHESION  02/10/2001  . [place]; 09/12/2006  . TARSAL TUNNEL RELEASE  2002  . TRANSTHORACIC ECHOCARDIOGRAM  08/04/2015   EF 60-65%, normal wall motion, mild LVH, mild LA dilation, grd I DD.  Marland Kitchen TUMOR EXCISION Left 03/21/2003   dorsal 1st web space (hand)  . URETEROLYSIS Right 02/10/2001    Family History  Problem Relation Age of Onset  . Diabetes Mother   . Hypertension Mother   . Heart attack Father        Mid 73's  . Heart disease Father   . Lung disease Father        spot on lung; had lung surgery  . Alcohol abuse Other   . Hypertension Son   . Diabetes Son   . Emphysema Maternal Grandfather   . Asthma Maternal Grandfather   . Colon cancer Paternal Grandfather   . Stomach cancer Paternal Grandmother   . Neuropathy Neg Hx     Social History   Social History  . Marital status: Married    Spouse name: N/A  . Number of children: N/A  . Years of education: N/A   Occupational History  . Retired    Social History Main Topics  . Smoking status: Never Smoker  . Smokeless tobacco: Never Used  . Alcohol use 0.0 oz/week     Comment: very rare  . Drug use: No  . Sexual activity: Yes    Partners: Male    Birth control/ protection: Surgical     Comment: hysterectomy   Other Topics Concern  . Not on file   Social History Narrative   She lives with husband in two-story home.  They have one grown son and 2 grandchildren.   She is retired [employer].   Highest of level education:  Some college    Outpatient Encounter Prescriptions as of 06/25/2017   Medication Sig  . azelastine (ASTELIN) 0.1 % nasal spray Place 2 sprays into both nostrils 2 (two) times daily.  . cromolyn (OPTICROM) 4 % ophthalmic solution Place 1 drop into both eyes as needed (allergies).   . Dulaglutide (TRULICITY) 1.5 JO/8.4ZY SOPN Inject under skin 1.5 mg weekly under skin.  Marland Kitchen esomeprazole (NEXIUM) 20 MG capsule Take 20 mg by mouth daily at 12 noon.  . furosemide (LASIX) 40 MG tablet Take 1 tablet (40 mg total)  by mouth daily.  Marland Kitchen gabapentin (NEURONTIN) 600 MG tablet 1 tab po bid  . glucose blood test strip Use as instructed to test blood sugar four times per day. DX E11.09 (Patient taking differently: Use as instructed to test blood sugar 6 times per day. DX E11.09)  . inFLIXimab (REMICADE) 100 MG injection Inject into the vein every 8 (eight) weeks.   . Insulin Pen Needle 31G X 5 MM MISC Use to inject insulins equal to 6 times daily.  . insulin regular human CONCENTRATED (HUMULIN R U-500 KWIKPEN) 500 UNIT/ML kwikpen Inject 80-120 units 3x a day  . lactase (LACTAID) 3000 UNITS tablet Take 3,000 Units by mouth as needed (when eating foods containing dairy).   Marland Kitchen loperamide (IMODIUM) 2 MG capsule Take 2 mg by mouth 3 (three) times daily as needed for diarrhea or loose stools.   . meclizine (ANTIVERT) 25 MG tablet Take 1 tablet (25 mg total) by mouth 3 (three) times daily as needed for dizziness or nausea.  . metoprolol succinate (TOPROL-XL) 100 MG 24 hr tablet Take 1 tablet (100 mg total) by mouth 2 (two) times daily. Take with or immediately following a meal.  . ONETOUCH DELICA LANCETS 13K MISC PATIENT USES ONE TOUCH DELICAL 44W LANCETS. Use as directed to check blood sugar up to 6 times per day. DX: E11.09  . potassium chloride (KLOR-CON M10) 10 MEQ tablet Take 2 tablets (20 mEq total) by mouth daily.  . vitamin B-12 (CYANOCOBALAMIN) 1000 MCG tablet Take 1,000 mcg by mouth daily.  Alveda Reasons 20 MG TABS tablet TAKE 1 TABLET (20 MG TOTAL) BY MOUTH DAILY WITH SUPPER.  .  [DISCONTINUED] azithromycin (ZITHROMAX) 250 MG tablet 2 tabs po qd x 1d, then 1 tab po qd x 4d (Patient not taking: Reported on 06/25/2017)  . [DISCONTINUED] Blood Glucose Monitoring Suppl (ONE TOUCH ULTRA MINI) w/Device KIT Use to check sugars, checks 4x daily. (Patient not taking: Reported on 06/25/2017)  . [DISCONTINUED] HYDROcodone-homatropine (HYCODAN) 5-1.5 MG/5ML syrup 1-2 tsp po qhs prn cough (Patient not taking: Reported on 06/25/2017)  . [DISCONTINUED] pantoprazole (PROTONIX) 40 MG tablet Take 1 tablet (40 mg total) by mouth daily. (Patient not taking: Reported on 03/21/2017)  . [DISCONTINUED] rivaroxaban (XARELTO) 20 MG TABS tablet Take 1 tablet (20 mg total) by mouth daily with supper. (Patient not taking: Reported on 06/25/2017)   No facility-administered encounter medications on file as of 06/25/2017.     Allergies  Allergen Reactions  . Actos [Pioglitazone] Other (See Comments)    Weight gain  . Benzocaine-Menthol Swelling    SWELLING OF MOUTH  . Colesevelam Other (See Comments)    GI UPSET  . Flagyl [Metronidazole Hcl] Other (See Comments)    DIAPHORESIS  . Metformin And Related Diarrhea  . Omeprazole Swelling    SWELLING OF TONGUE AND THROAT  . Shrimp [Shellfish Allergy] Itching    OF THROAT AND EARS  . Statins Other (See Comments)    HEART RACING  . Desipramine Hcl Itching, Nausea Only and Other (See Comments)    "swimmy" headed, ears itched   . Hydromorphone Itching  . Jardiance [Empagliflozin] Other (See Comments)    weakness  . Adhesive [Tape] Other (See Comments)    SKIN IRRITATION AND BRUISING  . Nisoldipine Itching  . Percocet [Oxycodone-Acetaminophen] Itching    ROS Review of Systems  Constitutional: Negative for fatigue and fever.  HENT: Negative for congestion and sore throat.   Eyes: Negative for visual disturbance.  Respiratory: Negative for cough.  Cardiovascular: Negative for chest pain.  Gastrointestinal: Negative for abdominal pain and nausea.   Genitourinary: Negative for dysuria.  Musculoskeletal: Positive for arthralgias (knees). Negative for back pain and joint swelling.  Skin: Negative for rash.  Neurological: Negative for weakness and headaches.  Hematological: Negative for adenopathy.    PE; Blood pressure 117/77, pulse 70, temperature 98.5 F (36.9 C), temperature source Oral, resp. rate 16, height 5' 1"  (1.549 m), weight 218 lb 4 oz (99 kg), SpO2 94 %. Body mass index is 41.24 kg/m.  Gen: Alert, well appearing.  Patient is oriented to person, place, time, and situation. GBM:SXJD: no injection, icteris, swelling, or exudate.  EOMI, PERRLA. Mouth: lips without lesion/swelling.  Oral mucosa pink and moist. Oropharynx without erythema, exudate, or swelling.  Neck - No masses or thyromegaly or limitation in range of motion CV: RRR, no m/r/g.   LUNGS: CTA bilat, nonlabored resps, good aeration in all lung fields. EXT: 2+ pitting edema on R, 1+ pitting edema on L. No clubbing or cyanosis.  Pertinent labs:  Lab Results  Component Value Date   TSH 1.01 12/18/2016   Lab Results  Component Value Date   WBC 5.2 01/10/2017   HGB 12.4 01/10/2017   HCT 38.1 01/10/2017   MCV 93.8 01/10/2017   PLT 186 01/10/2017   Lab Results  Component Value Date   CREATININE 0.91 12/18/2016   BUN 17 12/18/2016   NA 137 12/18/2016   K 4.1 12/18/2016   CL 105 12/18/2016   CO2 29 12/18/2016   Lab Results  Component Value Date   ALT 53 (H) 01/10/2017   AST 69 (H) 01/10/2017   ALKPHOS 70 12/18/2016   BILITOT 0.4 12/18/2016   Lab Results  Component Value Date   CHOL 230 (H) 08/29/2015   Lab Results  Component Value Date   HDL 42.90 08/29/2015   Lab Results  Component Value Date   LDLCALC 153 (H) 08/29/2015   Lab Results  Component Value Date   TRIG 175.0 (H) 08/29/2015   Lab Results  Component Value Date   CHOLHDL 5 08/29/2015   Lab Results  Component Value Date   HGBA1C 6.9 09/05/2016   ASSESSMENT AND PLAN:    Transfer pt:  1) DM 2: control has been pretty good but she is requiring high doses of insulin. Check HbA1c. Based on home glucoses reported today, will make some R U500 dosing changes today:  Continue 100 units of U500 R at supper.  Limit bedtime U500 dose to 30 U if glucose >200 at that time. Increase U500 at breakfast to 105 units. Increase U500 at Lunch to 110 Units. Continue trulicity 1.5 mg q week.  2) HTN; The current medical regimen is effective;  continue present plan and medications. CMET today.  3) Obesity: needs to increase efforts at diet/activity.  She insists she has "tried everything". She is frustrated. Suspect her severe insulin resistance + high doses of exogenous insulin are making it difficult to get wt down any. May discuss consideration of bariatric clinic referral at next f/u visit.  4) Transaminasemia: noted fatty liver detected on CT abd 2015. Check CMET today.  Of note, she is not on a statin.  F/u 6 weeks.  Signed:  Crissie Sickles, MD           06/26/2017

## 2017-06-26 ENCOUNTER — Encounter: Payer: Self-pay | Admitting: Family Medicine

## 2017-07-06 ENCOUNTER — Other Ambulatory Visit: Payer: Self-pay | Admitting: Internal Medicine

## 2017-07-07 ENCOUNTER — Encounter: Payer: Self-pay | Admitting: Cardiology

## 2017-07-07 NOTE — Progress Notes (Signed)
HPI: FU atrial fibrillation. Stress echocardiogram performed in September of 2011 was interpreted as normal. There was mention that LV function may not have improved as much as expected and if clinical suspicion high consider alternative imaging. Lexiscan Myoview in November of 2011 showed normal perfusion. Study not gated. CardioNet showed sinus with PVCs. Had EGD in November 2016 and developed atrial fibrillation. Placed on amiodarone and xarelto. Echocardiogram November 2016 showed normal LV function, moderate left ventricular hypertrophy, grade 1 diastolic dysfunction and mild left atrial enlargement. TSH November 2016 normal. Amiodarone DCed at previous ov. Since she was last seen, patient denies dyspnea, chest pain, bleeding or syncope. Occasional brief palpitations but improved since decreasing caffeine use.   Current Outpatient Prescriptions  Medication Sig Dispense Refill  . azelastine (ASTELIN) 0.1 % nasal spray Place 2 sprays into both nostrils 2 (two) times daily. 30 mL 1  . cromolyn (OPTICROM) 4 % ophthalmic solution Place 1 drop into both eyes as needed (allergies).     . Dulaglutide (TRULICITY) 1.5 ML/4.6TK SOPN Inject under skin 1.5 mg weekly under skin. 12 pen 1  . esomeprazole (NEXIUM) 20 MG capsule Take 20 mg by mouth daily at 12 noon.    . furosemide (LASIX) 40 MG tablet Take 1 tablet (40 mg total) by mouth daily. 90 tablet 2  . gabapentin (NEURONTIN) 600 MG tablet TAKE 1 TABLET BY MOUTH TWICE A DAY 60 tablet 6  . glucose blood test strip Use as instructed to test blood sugar four times per day. DX E11.09 (Patient taking differently: Use as instructed to test blood sugar 6 times per day. DX E11.09) 500 each 1  . HUMULIN R U-500 KWIKPEN 500 UNIT/ML kwikpen INJECT 80-120 UNITS 3X A DAY 30 pen 3  . inFLIXimab (REMICADE) 100 MG injection Inject into the vein every 8 (eight) weeks.     . Insulin Pen Needle 31G X 5 MM MISC Use to inject insulins equal to 6 times daily. 600 each 5   . lactase (LACTAID) 3000 UNITS tablet Take 3,000 Units by mouth as needed (when eating foods containing dairy).     Marland Kitchen loperamide (IMODIUM) 2 MG capsule Take 2 mg by mouth 3 (three) times daily as needed for diarrhea or loose stools.     . meclizine (ANTIVERT) 25 MG tablet Take 1 tablet (25 mg total) by mouth 3 (three) times daily as needed for dizziness or nausea. 90 tablet 0  . metoprolol succinate (TOPROL-XL) 100 MG 24 hr tablet Take 1 tablet (100 mg total) by mouth 2 (two) times daily. Take with or immediately following a meal. 90 tablet 3  . ONETOUCH DELICA LANCETS 35W MISC PATIENT USES ONE TOUCH DELICAL 65K LANCETS. Use as directed to check blood sugar up to 6 times per day. DX: E11.09 500 each 3  . potassium chloride (KLOR-CON M10) 10 MEQ tablet Take 2 tablets (20 mEq total) by mouth daily. 180 tablet 3  . vitamin B-12 (CYANOCOBALAMIN) 1000 MCG tablet Take 1,000 mcg by mouth daily.    Alveda Reasons 20 MG TABS tablet TAKE 1 TABLET (20 MG TOTAL) BY MOUTH DAILY WITH SUPPER. 90 tablet 1   No current facility-administered medications for this visit.      Past Medical History:  Diagnosis Date  . Anxiety   . 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 Fibromyalgia   . GERD   . Hepatic steatosis   . History of thrombocytopenia 12/2011  . Hyperlipidemia  Intolerant of statins  . HYPERTENSION   . IDDM (insulin dependent diabetes mellitus) ([place])    with DPN (managed by Dr. Cruzita Lederer)  . Morbid obesity ([place])   . OSA (obstructive sleep apnea) 09/14/2015   sleep study 09/07/15: severe obstructive sleep apnea with an AHI of 72 and SaO2 low of 75%.>refer to sleep med for eval and tx options  . Osteoarthritis    hips, shoulders, knees  . PAF (paroxysmal atrial fibrillation) ([place])    One documented episode (after getting EGD 2016).  Was on amiodarone x 3 mo.  Rate control with metoprolol + anticoag with xarelto.   . Small fiber neuropathy    Due to DM.  Symmetric hands and feet  tingling/numbness.  . Transaminasemia 2017/18   Hepatic steatosis on CT 2015  . Ulcerative colitis ([place])    Remicade infusion Q 8 weeks    Past Surgical History:  Procedure Laterality Date  . ABDOMINAL HYSTERECTOMY  1980   partial  . BACTERIAL OVERGROWTH TEST N/A 07/13/2015   Procedure: BACTERIAL OVERGROWTH TEST;  Surgeon: Rogene Houston, MD;  [facility];  Service: Endoscopy;  Laterality: N/A;  730    . BILATERAL SALPINGOOPHORECTOMY  02/10/2001  . BREAST REDUCTION SURGERY  1994  . CARDIOVASCULAR STRESS TEST  07/2010   Lexiscan myoview: normal  . CARPAL TUNNEL RELEASE Right 1996  . CARPAL TUNNEL RELEASE Left 03/21/2003  . CARPAL TUNNEL RELEASE Right 05/04/2013   Procedure: CARPAL TUNNEL RELEASE;  Surgeon: Cammie Sickle., MD;  Location: [place];  Service: Orthopedics;  Laterality: Right;  . CARPAL TUNNEL RELEASE Left 09/21/2013   Procedure: LEFT CARPAL TUNNEL RELEASE;  Surgeon: Cammie Sickle., MD;  Location: [place];  Service: Orthopedics;  Laterality: Left;  . CHOLECYSTECTOMY    . COLONOSCOPY WITH PROPOFOL N/A 08/04/2015   Colitis in remission.  No polyps.  Procedure: COLONOSCOPY WITH PROPOFOL;  Surgeon: Rogene Houston, MD;  [facility];  Service: Endoscopy;  Laterality: N/A;  cecum time in  0820   time out  0827    total time 7 minutes  . ESOPHAGEAL DILATION N/A 08/04/2015   Procedure: ESOPHAGEAL DILATION;  Surgeon: Rogene Houston, MD;  [facility];  Service: Endoscopy;  Laterality: N/A;  Maloney 57, no mucousal disruption  . ESOPHAGOGASTRODUODENOSCOPY (EGD) WITH ESOPHAGEAL DILATION  12/02/2005  . ESOPHAGOGASTRODUODENOSCOPY (EGD) WITH PROPOFOL N/A 08/04/2015   Procedure: ESOPHAGOGASTRODUODENOSCOPY (EGD) WITH PROPOFOL;  Surgeon: Rogene Houston, MD;  [facility];  Service: Endoscopy;  Laterality: N/A;  procedure 1  . FLEXIBLE SIGMOIDOSCOPY  01/17/2012   Procedure: FLEXIBLE SIGMOIDOSCOPY;  Surgeon: Rogene Houston, MD;   [facility];  Service: Endoscopy;  Laterality: N/A;  . HEMILAMINOTOMY LUMBAR SPINE Bilateral 09/07/1999   L4-5  . KNEE ARTHROSCOPY Right 01/1999; 10/2000  . LYSIS OF ADHESION  02/10/2001  . [place]; 09/12/2006  . TARSAL TUNNEL RELEASE  2002  . TRANSTHORACIC ECHOCARDIOGRAM  08/04/2015   EF 60-65%, normal wall motion, mild LVH, mild LA dilation, grd I DD.  Marland Kitchen TUMOR EXCISION Left 03/21/2003   dorsal 1st web space (hand)  . URETEROLYSIS Right 02/10/2001    Social History   Social History  . Marital status: Married    Spouse name: N/A  . Number of children: N/A  . Years of education: N/A   Occupational History  . Retired    Social History Main Topics  . Smoking status: Never Smoker  .  Smokeless tobacco: Never Used  . Alcohol use 0.0 oz/week     Comment: very rare  . Drug use: No  . Sexual activity: Yes    Partners: Male    Birth control/ protection: Surgical     Comment: hysterectomy   Other Topics Concern  . Not on file   Social History Narrative   She lives with husband in two-story home.  They have one grown son and 2 grandchildren.   She is retired [employer].   Highest of level education:  Some college.   Never smoker.   Alcohol: rare.       Family History  Problem Relation Age of Onset  . Diabetes Mother   . Hypertension Mother   . Heart attack Father        Mid 59's  . Heart disease Father   . Lung disease Father        spot on lung; had lung surgery  . Alcohol abuse Other   . Hypertension Son   . Diabetes Son   . Emphysema Maternal Grandfather   . Asthma Maternal Grandfather   . Colon cancer Paternal Grandfather   . Stomach cancer Paternal Grandmother   . Neuropathy Neg Hx     ROS: Some leg weakness but no fevers or chills, productive cough, hemoptysis, dysphasia, odynophagia, melena, hematochezia, dysuria, hematuria, rash, seizure activity, orthopnea, PND, pedal edema, claudication. Remaining systems are  negative.  Physical Exam: Well-developed well-nourished in no acute distress.  Skin is warm and dry.  HEENT is normal.  Neck is supple.  Chest is clear to auscultation with normal expansion.  Cardiovascular exam is regular rate and rhythm.  Abdominal exam nontender or distended. No masses palpated. Extremities show no edema. neuro grossly intact   A/P  1 Paroxysmal atrial fibrillation-patient remains in sinus rhythm today. Continue metoprolol for rate control if atrial fibrillation recurs. Continue xarelto. Check Hgb; recent Cr 0.68.  2 hypertension-blood pressure is controlled. Continue present medications.  3 hyperlipidemia-managed by primary care.  Kirk Ruths, MD

## 2017-07-14 ENCOUNTER — Other Ambulatory Visit: Payer: Self-pay | Admitting: Family Medicine

## 2017-07-14 NOTE — Telephone Encounter (Signed)
[facility]  RF request for gabapentin LOV:  06/25/17 Next ov: 08/13/17 Last written: 05/19/17 #60 w/ 1RF  Please advise. Thanks.

## 2017-07-17 ENCOUNTER — Encounter: Payer: Self-pay | Admitting: Cardiology

## 2017-07-17 ENCOUNTER — Telehealth: Payer: Self-pay | Admitting: Pulmonary Disease

## 2017-07-17 ENCOUNTER — Ambulatory Visit (INDEPENDENT_AMBULATORY_CARE_PROVIDER_SITE_OTHER): Payer: Medicare Other | Admitting: Cardiology

## 2017-07-17 VITALS — BP 132/76 | HR 68 | Ht 61.0 in | Wt 216.0 lb

## 2017-07-17 DIAGNOSIS — I480 Paroxysmal atrial fibrillation: Secondary | ICD-10-CM | POA: Diagnosis not present

## 2017-07-17 DIAGNOSIS — I10 Essential (primary) hypertension: Secondary | ICD-10-CM | POA: Diagnosis not present

## 2017-07-17 DIAGNOSIS — G4733 Obstructive sleep apnea (adult) (pediatric): Secondary | ICD-10-CM

## 2017-07-17 NOTE — Telephone Encounter (Signed)
Sent a message to [facility] to see what is going on

## 2017-07-17 NOTE — Patient Instructions (Signed)
Medication Instructions:   NO CHANGE  Labwork:  Your physician recommends that you return for lab work WHEN DRAWN NEXT  Follow-Up:  Your physician wants you to follow-up in: [place] will receive a reminder letter in the mail two months in advance. If you don't receive a letter, please call our office to schedule the follow-up appointment.   If you need a refill on your cardiac medications before your next appointment, please call your pharmacy.

## 2017-07-21 ENCOUNTER — Other Ambulatory Visit (INDEPENDENT_AMBULATORY_CARE_PROVIDER_SITE_OTHER): Payer: Self-pay | Admitting: *Deleted

## 2017-07-21 DIAGNOSIS — R7989 Other specified abnormal findings of blood chemistry: Secondary | ICD-10-CM

## 2017-07-21 DIAGNOSIS — K5190 Ulcerative colitis, unspecified, without complications: Secondary | ICD-10-CM

## 2017-07-21 DIAGNOSIS — R945 Abnormal results of liver function studies: Secondary | ICD-10-CM

## 2017-07-21 NOTE — Telephone Encounter (Signed)
VS, please advise if it ok for [place] to place an order for the patient to have a mask fitting session at the sleep center. Thanks!

## 2017-07-21 NOTE — Telephone Encounter (Signed)
[name] with [facility] said the things she needs have been ordered from the manufacture I have called her several time no response [name]

## 2017-07-21 NOTE — Telephone Encounter (Signed)
PCC's please advise if you've received any updates on this.  Thanks!

## 2017-07-21 NOTE — Telephone Encounter (Signed)
Finally was able to speak with pt she would like to do a mask fit at the sleep center I will need an order for this and I will ck with [facility] to see what the status of her cpap equiptment is [name]

## 2017-07-21 NOTE — Telephone Encounter (Signed)
Okay to place order.

## 2017-07-21 NOTE — Telephone Encounter (Signed)
Order has been placed.

## 2017-07-22 ENCOUNTER — Encounter (INDEPENDENT_AMBULATORY_CARE_PROVIDER_SITE_OTHER): Payer: Self-pay | Admitting: Internal Medicine

## 2017-07-22 ENCOUNTER — Ambulatory Visit (INDEPENDENT_AMBULATORY_CARE_PROVIDER_SITE_OTHER): Payer: Medicare Other | Admitting: Internal Medicine

## 2017-07-22 VITALS — BP 128/80 | HR 66 | Temp 97.9°F | Resp 18 | Ht 61.0 in | Wt 221.0 lb

## 2017-07-22 DIAGNOSIS — K580 Irritable bowel syndrome with diarrhea: Secondary | ICD-10-CM | POA: Diagnosis not present

## 2017-07-22 DIAGNOSIS — K760 Fatty (change of) liver, not elsewhere classified: Secondary | ICD-10-CM

## 2017-07-22 DIAGNOSIS — K5100 Ulcerative (chronic) pancolitis without complications: Secondary | ICD-10-CM | POA: Diagnosis not present

## 2017-07-22 NOTE — Progress Notes (Signed)
Presenting complaint;  Follow-up for ulcerative colitis. Elevated transaminases. History of fatty liver.  Subjective:  Patient is 71 year old Caucasian female who is in for scheduled visit. She was last seen 6 months ago. She remains with intermittent diarrhea. On most days she has to to 6 bowel movements per day. Once a week she has a day without BM. She is using Imodium on as-needed basis. She takes and when she has to leave house. She has occasional accident and or nocturnal bowel movement. She denies melena or rectal bleeding or abdominal pain. She says for the last month or so she has been doing 30 minutes of exercise on recumbent bike. She is not having any side effects with infliximab. She had blood work last month which is reviewed under lab data. She remains concerned about her inability to lose weight. She feels most of her weight gain occurred when she went on insulin.   Current Medications: Outpatient Encounter Prescriptions as of 07/22/2017  Medication Sig  . Dulaglutide (TRULICITY) 1.5 EX/9.3ZJ SOPN Inject under skin 1.5 mg weekly under skin.  Marland Kitchen esomeprazole (NEXIUM) 20 MG capsule Take 20 mg by mouth daily at 12 noon.  . furosemide (LASIX) 40 MG tablet Take 1 tablet (40 mg total) by mouth daily.  Marland Kitchen gabapentin (NEURONTIN) 600 MG tablet TAKE 1 TABLET BY MOUTH TWICE A DAY  . glucose blood test strip Use as instructed to test blood sugar four times per day. DX E11.09 (Patient taking differently: Use as instructed to test blood sugar 6 times per day. DX E11.09)  . HUMULIN R U-500 KWIKPEN 500 UNIT/ML kwikpen INJECT 80-120 UNITS 3X A DAY  . inFLIXimab (REMICADE) 100 MG injection Inject into the vein every 8 (eight) weeks.   . Insulin Pen Needle 31G X 5 MM MISC Use to inject insulins equal to 6 times daily.  Marland Kitchen lactase (LACTAID) 3000 UNITS tablet Take 3,000 Units by mouth as needed (when eating foods containing dairy).   Marland Kitchen loperamide (IMODIUM) 2 MG capsule Take 2 mg by mouth 3 (three)  times daily as needed for diarrhea or loose stools.   . meclizine (ANTIVERT) 25 MG tablet Take 1 tablet (25 mg total) by mouth 3 (three) times daily as needed for dizziness or nausea.  . metoprolol succinate (TOPROL-XL) 100 MG 24 hr tablet Take 1 tablet (100 mg total) by mouth 2 (two) times daily. Take with or immediately following a meal.  . ONETOUCH DELICA LANCETS 69C MISC PATIENT USES ONE TOUCH DELICAL 78L LANCETS. Use as directed to check blood sugar up to 6 times per day. DX: E11.09  . potassium chloride (KLOR-CON M10) 10 MEQ tablet Take 2 tablets (20 mEq total) by mouth daily.  . vitamin B-12 (CYANOCOBALAMIN) 1000 MCG tablet Take 1,000 mcg by mouth daily.  Alveda Reasons 20 MG TABS tablet TAKE 1 TABLET (20 MG TOTAL) BY MOUTH DAILY WITH SUPPER.  . [DISCONTINUED] azelastine (ASTELIN) 0.1 % nasal spray Place 2 sprays into both nostrils 2 (two) times daily. (Patient not taking: Reported on 07/22/2017)  . [DISCONTINUED] cromolyn (OPTICROM) 4 % ophthalmic solution Place 1 drop into both eyes as needed (allergies).    No facility-administered encounter medications on file as of 07/22/2017.      Objective: Blood pressure 128/80, pulse 66, temperature 97.9 F (36.6 C), temperature source Oral, resp. rate 18, height 5' 1"  (1.549 m), weight 221 lb (100.2 kg). Patient is alert and in no acute distress. Conjunctiva is pink. Sclera is nonicteric Oropharyngeal mucosa is normal. No  neck masses or thyromegaly noted. Cardiac exam with regular rhythm normal S1 and S2. No murmur or gallop noted. Lungs are clear to auscultation. Abdomen is full but soft and nontender without organomegaly or masses. Trace edema around ankles.  Labs/studies Results: Lab data from 06/25/2017  AST 161 and ALT 118   Lab data from 12/18/2016  AST 56 and ALT 57.    Assessment:  #1. Ulcerative colitis. She is on infliximab for maintenance and remains in remission. Last colonoscopy was in November 2016 revealing endoscopic  remission as well. If she remains in remission will consider next surveillance colonoscopy in 3 years.  #2. Chronic diarrhea felt to be due to IBS. She is using loperamide on as-needed basis with good symptom control.  #3. Elevated transaminases. She has fatty liver. Transaminases have gone up. She does not have any symptoms of biliary tract disease. Will continue to monitor. Now that she is exercising regularly hour expect transaminases to drop.   Plan:  CBC with differential and LFTs within the next 2 weeks. She can get it done at rheumatology clinic where she is receiving infliximab. Office visit in 6 months.

## 2017-07-22 NOTE — Patient Instructions (Addendum)
Physician will call with results of blood tests when completed. Consultation with Dr. Sonia Baller to be arranged for treatment of hemorrhoids.

## 2017-07-23 ENCOUNTER — Other Ambulatory Visit: Payer: Self-pay

## 2017-07-23 ENCOUNTER — Other Ambulatory Visit: Payer: Self-pay | Admitting: Internal Medicine

## 2017-07-23 MED ORDER — GLUCOSE BLOOD VI STRP: ORAL_STRIP | 5 refills | Status: DC

## 2017-08-01 ENCOUNTER — Other Ambulatory Visit: Payer: Self-pay | Admitting: Obstetrics & Gynecology

## 2017-08-01 DIAGNOSIS — Z1231 Encounter for screening mammogram for malignant neoplasm of breast: Secondary | ICD-10-CM

## 2017-08-06 ENCOUNTER — Ambulatory Visit: Payer: Medicare Other | Admitting: Family Medicine

## 2017-08-12 ENCOUNTER — Ambulatory Visit (HOSPITAL_BASED_OUTPATIENT_CLINIC_OR_DEPARTMENT_OTHER): Payer: Medicare Other | Attending: Pulmonary Disease | Admitting: Radiology

## 2017-08-12 DIAGNOSIS — G4733 Obstructive sleep apnea (adult) (pediatric): Secondary | ICD-10-CM

## 2017-08-13 ENCOUNTER — Ambulatory Visit: Payer: Medicare Other | Admitting: Family Medicine

## 2017-08-13 ENCOUNTER — Encounter: Payer: Self-pay | Admitting: Family Medicine

## 2017-08-13 ENCOUNTER — Other Ambulatory Visit: Payer: Self-pay

## 2017-08-13 VITALS — BP 129/80 | HR 72 | Temp 98.1°F | Resp 16 | Ht 61.0 in | Wt 221.8 lb

## 2017-08-13 DIAGNOSIS — Z23 Encounter for immunization: Secondary | ICD-10-CM | POA: Diagnosis not present

## 2017-08-13 DIAGNOSIS — K7581 Nonalcoholic steatohepatitis (NASH): Secondary | ICD-10-CM

## 2017-08-13 DIAGNOSIS — Z794 Long term (current) use of insulin: Secondary | ICD-10-CM | POA: Diagnosis not present

## 2017-08-13 DIAGNOSIS — R2681 Unsteadiness on feet: Secondary | ICD-10-CM

## 2017-08-13 DIAGNOSIS — E118 Type 2 diabetes mellitus with unspecified complications: Secondary | ICD-10-CM

## 2017-08-13 NOTE — Progress Notes (Signed)
OFFICE VISIT  08/13/2017   CC:  Chief Complaint  Patient presents with  . Follow-up    DM2   HPI:    Patient is a 71 y.o. Caucasian female who presents for 3 mo f/u DM 2. Increased her U-500 R dosing to 105 qAM, 110 qLunch, and kept supper dose at 110, told her to limit hs dose to 30 U max if gluc>200 at that time. We continued trulicity 2.5ZD q week.  Reviewed home glucoses today: fasting avg about 150 but vary widely (78-232), before lunch avg over 200, 2H after supper avg about 200 (100-380 range!), bedtime around 170-180 avg (136-293). No hypoglycemia. Pt very frustrated with her inability to lose wt.  States she makes efforts to cut down on calories and she rides a recumbant bike daily x 20 min.  She is interested in medication that may help her lose wt.  Reports chronic mild burning, tingling, and numbness of bottoms of both feet.  No hx of diabetic ulcer.  Past Medical History:  Diagnosis Date  . Anxiety   . 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 Fibromyalgia   . GERD   . Hepatic steatosis    Viral Hep screens NEG.  CT 2015.  Transaminasemia.  Marland Kitchen History of thrombocytopenia 12/2011  . Hyperlipidemia    Intolerant of statins  . HYPERTENSION   . IDDM (insulin dependent diabetes mellitus) ([place])    with DPN (managed by Dr. Cruzita Lederer)  . Morbid obesity ([place])   . OSA (obstructive sleep apnea) 09/14/2015   sleep study 09/07/15: severe obstructive sleep apnea with an AHI of 72 and SaO2 low of 75%.>refer to sleep med for eval and tx options  . Osteoarthritis    hips, shoulders, knees  . PAF (paroxysmal atrial fibrillation) ([place])    One documented episode (after getting EGD 2016).  Was on amiodarone x 3 mo.  Rate control with metoprolol + anticoag with xarelto.   . Small fiber neuropathy    Due to DM.  Symmetric hands and feet tingling/numbness.  Marland Kitchen Ulcerative colitis ([place])    Remicade infusion Q 8 weeks    Past Surgical History:  Procedure Laterality  Date  . ABDOMINAL HYSTERECTOMY  1980   partial  . BILATERAL SALPINGOOPHORECTOMY  02/10/2001  . BREAST REDUCTION SURGERY  1994  . CARDIOVASCULAR STRESS TEST  07/2010   Lexiscan myoview: normal  . CARPAL TUNNEL RELEASE Right 1996  . CARPAL TUNNEL RELEASE Left 03/21/2003  . CHOLECYSTECTOMY    . ESOPHAGOGASTRODUODENOSCOPY (EGD) WITH ESOPHAGEAL DILATION  12/02/2005  . HEMILAMINOTOMY LUMBAR SPINE Bilateral 09/07/1999   L4-5  . KNEE ARTHROSCOPY Right 01/1999; 10/2000  . LYSIS OF ADHESION  02/10/2001  . [place]; 09/12/2006  . TARSAL TUNNEL RELEASE  2002  . TRANSTHORACIC ECHOCARDIOGRAM  08/04/2015   EF 60-65%, normal wall motion, mild LVH, mild LA dilation, grd I DD.  Marland Kitchen TUMOR EXCISION Left 03/21/2003   dorsal 1st web space (hand)  . URETEROLYSIS Right 02/10/2001    Outpatient Medications Prior to Visit  Medication Sig Dispense Refill  . Dulaglutide (TRULICITY) 1.5 GL/8.7FI SOPN Inject under skin 1.5 mg weekly under skin. 12 pen 1  . esomeprazole (NEXIUM) 20 MG capsule Take 20 mg by mouth daily at 12 noon.    . furosemide (LASIX) 40 MG tablet Take 1 tablet (40 mg total) by mouth daily. 90 tablet 2  . gabapentin (NEURONTIN) 600 MG tablet TAKE 1 TABLET BY MOUTH TWICE A  DAY 60 tablet 6  . glucose blood (ONE TOUCH ULTRA TEST) test strip Use as instructed to check sugar 4 times daily 400 each 5  . glucose blood test strip Use as instructed to test blood sugar four times per day. DX E11.09 (Patient taking differently: Use as instructed to test blood sugar 6 times per day. DX E11.09) 500 each 1  . HUMULIN R U-500 KWIKPEN 500 UNIT/ML kwikpen INJECT 80-120 UNITS 3X A DAY 30 pen 3  . inFLIXimab (REMICADE) 100 MG injection Inject into the vein every 8 (eight) weeks.     . Insulin Pen Needle 31G X 5 MM MISC Use to inject insulins equal to 6 times daily. 600 each 5  . lactase (LACTAID) 3000 UNITS tablet Take 3,000 Units by mouth as needed (when eating foods containing dairy).     Marland Kitchen loperamide  (IMODIUM) 2 MG capsule Take 2 mg by mouth 3 (three) times daily as needed for diarrhea or loose stools.     . meclizine (ANTIVERT) 25 MG tablet Take 1 tablet (25 mg total) by mouth 3 (three) times daily as needed for dizziness or nausea. 90 tablet 0  . metoprolol succinate (TOPROL-XL) 100 MG 24 hr tablet TAKE 1 TABLET BY MOUTH 2 TIMES DAILY. TAKE WITH OR IMMEDIATELY FOLLOWING A MEAL 180 tablet 3  . ONETOUCH DELICA LANCETS 29H MISC PATIENT USES ONE TOUCH DELICAL 37J LANCETS. Use as directed to check blood sugar up to 6 times per day. DX: E11.09 500 each 3  . potassium chloride (KLOR-CON M10) 10 MEQ tablet Take 2 tablets (20 mEq total) by mouth daily. 180 tablet 3  . vitamin B-12 (CYANOCOBALAMIN) 1000 MCG tablet Take 1,000 mcg by mouth daily.    Alveda Reasons 20 MG TABS tablet TAKE 1 TABLET (20 MG TOTAL) BY MOUTH DAILY WITH SUPPER. 90 tablet 1   No facility-administered medications prior to visit.     Allergies  Allergen Reactions  . Actos [Pioglitazone] Other (See Comments)    Weight gain  . Benzocaine-Menthol Swelling    SWELLING OF MOUTH  . Colesevelam Other (See Comments)    GI UPSET  . Flagyl [Metronidazole Hcl] Other (See Comments)    DIAPHORESIS  . Metformin And Related Diarrhea  . Omeprazole Swelling    SWELLING OF TONGUE AND THROAT  . Shrimp [Shellfish Allergy] Itching    OF THROAT AND EARS  . Statins Other (See Comments)    HEART RACING  . Desipramine Hcl Itching, Nausea Only and Other (See Comments)    "swimmy" headed, ears itched   . Hydromorphone Itching  . Jardiance [Empagliflozin] Other (See Comments)    weakness  . Adhesive [Tape] Other (See Comments)    SKIN IRRITATION AND BRUISING  . Nisoldipine Itching  . Percocet [Oxycodone-Acetaminophen] Itching    ROS As per HPI  PE: Blood pressure 129/80, pulse 72, temperature 98.1 F (36.7 C), temperature source Oral, resp. rate 16, height 5' 1"  (1.549 m), weight 221 lb 12 oz (100.6 kg), SpO2 95 %. Gen: Alert, well  appearing.  Patient is oriented to person, place, time, and situation. AFFECT: pleasant, lucid thought and speech. Foot exam -  no swelling, tenderness or skin or vascular lesions. Color and temperature is normal. Sensation is absent to monofilament testing on plantar surfaces of both feet. Peripheral pulses are palpable. Toenails are normal.   LABS:    Chemistry      Component Value Date/Time   NA 137 06/25/2017 1344   K 4.1 06/25/2017  1344   CL 100 06/25/2017 1344   CO2 30 06/25/2017 1344   BUN 11 06/25/2017 1344   CREATININE 0.68 06/25/2017 1344   CREATININE 0.95 11/03/2015 1625      Component Value Date/Time   CALCIUM 9.4 06/25/2017 1344   ALKPHOS 76 06/25/2017 1344   AST 161 (H) 06/25/2017 1344   ALT 118 (H) 06/25/2017 1344   BILITOT 0.6 06/25/2017 1344     Lab Results  Component Value Date   CHOL 230 (H) 08/29/2015   HDL 42.90 08/29/2015   LDLCALC 153 (H) 08/29/2015   LDLDIRECT 69.5 01/24/2011   TRIG 175.0 (H) 08/29/2015   CHOLHDL 5 08/29/2015   Lab Results  Component Value Date   TSH 1.01 12/18/2016   Lab Results  Component Value Date   HGBA1C 7.4 (H) 06/25/2017    IMPRESSION AND PLAN:  1) DM 2; control not ideal per last HbA1c. Has wildly varying glucoses at most times of day when she checks glucoses. Plan: continue same supper and hs regimen, increase BF to 108 U500, increase U500 at lunch to 115. Continue trulicity 8.5TM q week. Feet exam today showed stable bilat plantar loss of sensation. Next A1c due in 6 weeks. Flu vaccine today.  2) Unsteady gait: At end of visit pt states she feels unstable walking, says her PN causes feet to feel like they are going to "give out" after walking only 30-40 steps, also reports hips and knees hurting when walking.  She asked for rx for 4 wheel walker with seat so I wrote this rx today.  3) Morbid obesity: she'll continue to try to limit calories as well as continue her daily recumbant biking. Pt expressed desire  for referral to bariatric specialist so she could be considered for wt loss meds. I ordered referral to Dr. Shary Decamp today.  4) NASH: Labs being done in near future when she gets her next remicaid infusion (CBC, CMET)-ordered by Dr. Stanford Breed. Abd u/s to re-image liver (NA[facility])--not discussed--may be needed in near future if transaminases continue to rise.  An After Visit Summary was printed and given to the patient.  FOLLOW UP: Return in about 6 weeks (around 09/24/2017) for f/u DM 2.  Signed:  Crissie Sickles, MD           08/13/2017

## 2017-08-13 NOTE — Patient Instructions (Signed)
Increase your breakfast U500 R to 108 Units, and increase your lunchtime U500 R to 115 Units. Keep supper U500 dosing and bedtime U500 dosing plan the same.

## 2017-09-10 ENCOUNTER — Other Ambulatory Visit (INDEPENDENT_AMBULATORY_CARE_PROVIDER_SITE_OTHER): Payer: Self-pay | Admitting: *Deleted

## 2017-09-10 DIAGNOSIS — K5190 Ulcerative colitis, unspecified, without complications: Secondary | ICD-10-CM

## 2017-09-10 NOTE — Progress Notes (Signed)
Patient was called and made aware of her results and that we would repeat her lab work in 3 months.

## 2017-09-17 ENCOUNTER — Encounter (HOSPITAL_COMMUNITY): Payer: Self-pay

## 2017-09-17 ENCOUNTER — Ambulatory Visit (HOSPITAL_COMMUNITY)
Admission: RE | Admit: 2017-09-17 | Discharge: 2017-09-17 | Disposition: A | Discharge status: Home / Self Care | Payer: Medicare Other | Source: Ambulatory Visit | Attending: Obstetrics & Gynecology | Admitting: Obstetrics & Gynecology

## 2017-09-17 DIAGNOSIS — Z1231 Encounter for screening mammogram for malignant neoplasm of breast: Secondary | ICD-10-CM | POA: Insufficient documentation

## 2017-09-26 ENCOUNTER — Ambulatory Visit: Payer: Medicare Other | Admitting: Family Medicine

## 2017-09-26 ENCOUNTER — Encounter: Payer: Self-pay | Admitting: Family Medicine

## 2017-09-26 ENCOUNTER — Ambulatory Visit: Payer: Medicare Other

## 2017-09-26 VITALS — BP 144/84 | HR 67 | Temp 98.0°F | Resp 16 | Ht 61.0 in | Wt 221.5 lb

## 2017-09-26 DIAGNOSIS — K760 Fatty (change of) liver, not elsewhere classified: Secondary | ICD-10-CM

## 2017-09-26 DIAGNOSIS — E118 Type 2 diabetes mellitus with unspecified complications: Secondary | ICD-10-CM | POA: Diagnosis not present

## 2017-09-26 DIAGNOSIS — Z794 Long term (current) use of insulin: Secondary | ICD-10-CM

## 2017-09-26 DIAGNOSIS — F321 Major depressive disorder, single episode, moderate: Secondary | ICD-10-CM

## 2017-09-26 DIAGNOSIS — G5690 Unspecified mononeuropathy of unspecified upper limb: Secondary | ICD-10-CM

## 2017-09-26 DIAGNOSIS — I10 Essential (primary) hypertension: Secondary | ICD-10-CM | POA: Diagnosis not present

## 2017-09-26 DIAGNOSIS — M792 Neuralgia and neuritis, unspecified: Secondary | ICD-10-CM

## 2017-09-26 DIAGNOSIS — Z0000 Encounter for general adult medical examination without abnormal findings: Secondary | ICD-10-CM

## 2017-09-26 DIAGNOSIS — G629 Polyneuropathy, unspecified: Secondary | ICD-10-CM

## 2017-09-26 DIAGNOSIS — G5793 Unspecified mononeuropathy of bilateral lower limbs: Secondary | ICD-10-CM

## 2017-09-26 DIAGNOSIS — Z23 Encounter for immunization: Secondary | ICD-10-CM

## 2017-09-26 LAB — COMPREHENSIVE METABOLIC PANEL
ALBUMIN: 3.7 g/dL (ref 3.5–5.2)
ALK PHOS: 78 U/L (ref 39–117)
ALT: 68 U/L — ABNORMAL HIGH (ref 0–35)
AST: 88 U/L — ABNORMAL HIGH (ref 0–37)
BUN: 13 mg/dL (ref 6–23)
CALCIUM: 9.1 mg/dL (ref 8.4–10.5)
CHLORIDE: 101 meq/L (ref 96–112)
CO2: 32 mEq/L (ref 19–32)
Creatinine, Ser: 0.7 mg/dL (ref 0.40–1.20)
GFR: 87.9 mL/min (ref >60.00)
Glucose, Bld: 151 mg/dL — ABNORMAL HIGH (ref 70–99)
POTASSIUM: 4.6 meq/L (ref 3.5–5.1)
SODIUM: 138 meq/L (ref 135–145)
TOTAL PROTEIN: 7.1 g/dL (ref 6.0–8.3)
Total Bilirubin: 0.6 mg/dL (ref 0.2–1.2)

## 2017-09-26 LAB — HEMOGLOBIN A1C: Hgb A1c MFr Bld: 7.1 % — ABNORMAL HIGH (ref 4.6–6.5)

## 2017-09-26 MED ORDER — GABAPENTIN 300 MG PO CAPS: ORAL_CAPSULE | ORAL | 1 refills | Status: DC

## 2017-09-26 MED ORDER — CITALOPRAM HYDROBROMIDE 20 MG PO TABS: 20.0000 mg | ORAL_TABLET | Freq: Every day | ORAL | 1 refills | Status: DC

## 2017-09-26 MED ORDER — ZOSTER VAC RECOMB ADJUVANTED 50 MCG/0.5ML IM SUSR: 0.5000 mL | Freq: Once | INTRAMUSCULAR | 1 refills | Status: AC

## 2017-09-26 MED ORDER — LORAZEPAM 0.5 MG PO TABS: ORAL_TABLET | ORAL | 1 refills | Status: DC

## 2017-09-26 NOTE — Progress Notes (Signed)
Subjective:   [name] is a 71 y.o. female who presents for an Initial Medicare Annual Wellness Visit.  Review of Systems    No ROS.  Medicare Wellness Visit. Additional risk factors are reflected in the social history.  Cardiac Risk Factors include: advanced age (>35mn, >>63women);diabetes mellitus;dyslipidemia;hypertension;obesity (BMI >30kg/m2);family history of premature cardiovascular disease   Sleep patterns: Sleeps 8 hours. Uses CPAP Home Safety/Smoke Alarms: Feels safe in home. Smoke alarms in place.  Living environment; residence and Firearm Safety: Lives with husband in 2 story home.  Seat Belt Safety/Bike Helmet: Wears seat belt.   Female:   Pap-N/A       Mammo-09/17/2017, Negative.  AForestine Na    Dexa scan-Ordered by Dr. MHale Boguson 10/25/2016  Plans to postpone until next mammo.       [facility]-Colonoscopy 08/01/2015, normal. Recall 5 years. Rehman.      Objective:    Today's Vitals   09/26/17 1310  BP: (!) 144/84  Pulse: 67  Resp: 16  Temp: 98 F (36.7 C)  TempSrc: Oral  SpO2: 96%  Weight: 221 lb 8 oz (100.5 kg)  Height: 5' 1"  (1.549 m)   Body mass index is 41.85 kg/m.  Advanced Directives 09/26/2017 08/13/2016 09/07/2015 08/04/2015 07/31/2015 07/04/2015 12/27/2014  Does Patient Have a Medical Advance Directive? Yes Yes No;Yes Yes Yes Yes No;Yes  Type of Advance Directive Living will;Healthcare Power of [place] will;Advance instruction for mental health [place] will -  Does patient want to make changes to medical advance directive? - No - Patient declined - - - - -  Copy of [place] Chart? No - copy requested No - copy requested No - copy requested No - copy requested No - copy requested No - copy requested No - copy requested  Would patient like information on creating a medical advance directive? - - No - patient  declined information - - - No - patient declined information  Pre-existing out of facility DNR order (yellow form or pink MOST form) - - - - - - -    Current Medications (verified) Outpatient Encounter Medications as of 09/26/2017  Medication Sig  . Dulaglutide (TRULICITY) 1.5 MER/1.5QMSOPN Inject under skin 1.5 mg weekly under skin.  .Marland Kitchenesomeprazole (NEXIUM) 20 MG capsule Take 20 mg by mouth daily at 12 noon.  . furosemide (LASIX) 40 MG tablet Take 1 tablet (40 mg total) by mouth daily.  .Marland Kitchenglucose blood (ONE TOUCH ULTRA TEST) test strip Use as instructed to check sugar 4 times daily  . glucose blood test strip Use as instructed to test blood sugar four times per day. DX E11.09 (Patient taking differently: Use as instructed to test blood sugar 6 times per day. DX E11.09)  . HUMULIN R U-500 KWIKPEN 500 UNIT/ML kwikpen INJECT 80-120 UNITS 3X A DAY  . inFLIXimab (REMICADE) 100 MG injection Inject into the vein every 8 (eight) weeks.   . Insulin Pen Needle 31G X 5 MM MISC Use to inject insulins equal to 6 times daily.  .Marland Kitchenlactase (LACTAID) 3000 UNITS tablet Take 3,000 Units by mouth as needed (when eating foods containing dairy).   .Marland Kitchenloperamide (IMODIUM) 2 MG capsule Take 2 mg by mouth 3 (three) times daily as needed for diarrhea or loose stools.   . meclizine (ANTIVERT) 25 MG tablet Take 1 tablet (25 mg total) by mouth 3 (  three) times daily as needed for dizziness or nausea.  . metoprolol succinate (TOPROL-XL) 100 MG 24 hr tablet TAKE 1 TABLET BY MOUTH 2 TIMES DAILY. TAKE WITH OR IMMEDIATELY FOLLOWING A MEAL  . ONETOUCH DELICA LANCETS 16X MISC PATIENT USES ONE TOUCH DELICAL 09U LANCETS. Use as directed to check blood sugar up to 6 times per day. DX: E11.09  . potassium chloride (KLOR-CON M10) 10 MEQ tablet Take 2 tablets (20 mEq total) by mouth daily.  . vitamin B-12 (CYANOCOBALAMIN) 1000 MCG tablet Take 1,000 mcg by mouth daily.  Alveda Reasons 20 MG TABS tablet TAKE 1 TABLET (20 MG TOTAL) BY MOUTH  DAILY WITH SUPPER.  . [DISCONTINUED] gabapentin (NEURONTIN) 600 MG tablet TAKE 1 TABLET BY MOUTH TWICE A DAY  . citalopram (CELEXA) 20 MG tablet Take 1 tablet (20 mg total) by mouth daily.  Marland Kitchen gabapentin (NEURONTIN) 300 MG capsule 3-4 caps po bid  . LORazepam (ATIVAN) 0.5 MG tablet 1-2 tabs po bid prn moderate to severe anxiety  . Zoster Vaccine Adjuvanted [facility]) injection Inject 0.5 mLs into the muscle once for 1 dose.   No facility-administered encounter medications on file as of 09/26/2017.     Allergies (verified) Actos [pioglitazone]; Benzocaine-menthol; Colesevelam; Flagyl [metronidazole hcl]; Metformin and related; Omeprazole; Shrimp [shellfish allergy]; Statins; Desipramine hcl; Hydromorphone; Jardiance [empagliflozin]; Adhesive [tape]; Nisoldipine; and Percocet [oxycodone-acetaminophen]   History: Past Medical History:  Diagnosis Date  . Anxiety   . 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 Fibromyalgia   . GERD   . Hepatic steatosis    Viral Hep screens NEG.  CT 2015.  Transaminasemia.  Marland Kitchen History of thrombocytopenia 12/2011  . Hyperlipidemia    Intolerant of statins  . HYPERTENSION   . IDDM (insulin dependent diabetes mellitus) ([place])    with DPN (managed by Dr. Cruzita Lederer but then in 2018 pt preferred to have me manage for her convenience)  . Morbid obesity ([place])   . OSA (obstructive sleep apnea) 09/14/2015   sleep study 09/07/15: severe obstructive sleep apnea with an AHI of 72 and SaO2 low of 75%.>refer to sleep med for eval and tx options  . Osteoarthritis    hips, shoulders, knees  . PAF (paroxysmal atrial fibrillation) ([place])    One documented episode (after getting EGD 2016).  Was on amiodarone x 3 mo.  Rate control with metoprolol + anticoag with xarelto.   . Small fiber neuropathy    Due to DM.  Symmetric hands and feet tingling/numbness.  Marland Kitchen Ulcerative colitis ([place])    Remicade infusion Q 8 weeks   Past Surgical History:  Procedure Laterality  Date  . ABDOMINAL HYSTERECTOMY  1980   partial  . BACTERIAL OVERGROWTH TEST N/A 07/13/2015   Procedure: BACTERIAL OVERGROWTH TEST;  Surgeon: Rogene Houston, MD;  [facility];  Service: Endoscopy;  Laterality: N/A;  730    . BILATERAL SALPINGOOPHORECTOMY  02/10/2001  . BREAST REDUCTION SURGERY  1994  . CARDIOVASCULAR STRESS TEST  07/2010   Lexiscan myoview: normal  . CARPAL TUNNEL RELEASE Right 1996  . CARPAL TUNNEL RELEASE Left 03/21/2003  . CARPAL TUNNEL RELEASE Right 05/04/2013   Procedure: CARPAL TUNNEL RELEASE;  Surgeon: Cammie Sickle., MD;  Location: [place];  Service: Orthopedics;  Laterality: Right;  . CARPAL TUNNEL RELEASE Left 09/21/2013   Procedure: LEFT CARPAL TUNNEL RELEASE;  Surgeon: Cammie Sickle., MD;  Location: [facility];  Service: Orthopedics;  Laterality:  Left;  . CHOLECYSTECTOMY    . COLONOSCOPY WITH PROPOFOL N/A 08/04/2015   Colitis in remission.  No polyps.  Procedure: COLONOSCOPY WITH PROPOFOL;  Surgeon: Rogene Houston, MD;  Location: [facility];  Service: Endoscopy;  Laterality: N/A;  cecum time in  0820   time out  0827    total time 7 minutes  . ESOPHAGEAL DILATION N/A 08/04/2015   Procedure: ESOPHAGEAL DILATION;  Surgeon: Rogene Houston, MD;  Location: [facility];  Service: Endoscopy;  Laterality: N/A;  [place] 57, no mucousal disruption  . ESOPHAGOGASTRODUODENOSCOPY (EGD) WITH ESOPHAGEAL DILATION  12/02/2005  . ESOPHAGOGASTRODUODENOSCOPY (EGD) WITH PROPOFOL N/A 08/04/2015   Procedure: ESOPHAGOGASTRODUODENOSCOPY (EGD) WITH PROPOFOL;  Surgeon: Rogene Houston, MD;  [facility];  Service: Endoscopy;  Laterality: N/A;  procedure 1  . FLEXIBLE SIGMOIDOSCOPY  01/17/2012   Procedure: FLEXIBLE SIGMOIDOSCOPY;  Surgeon: Rogene Houston, MD;  [facility];  Service: Endoscopy;  Laterality: N/A;  . HEMILAMINOTOMY LUMBAR SPINE Bilateral 09/07/1999   L4-5  . KNEE ARTHROSCOPY Right 01/1999; 10/2000  . LYSIS OF ADHESION   02/10/2001  . [place]; 09/12/2006  . REDUCTION MAMMAPLASTY Bilateral   . TARSAL TUNNEL RELEASE  2002  . TRANSTHORACIC ECHOCARDIOGRAM  08/04/2015   EF 60-65%, normal wall motion, mild LVH, mild LA dilation, grd I DD.  Marland Kitchen TUMOR EXCISION Left 03/21/2003   dorsal 1st web space (hand)  . URETEROLYSIS Right 02/10/2001   Family History  Problem Relation Age of Onset  . Diabetes Mother   . Hypertension Mother   . Heart attack Father        Mid 53's  . Heart disease Father   . Lung disease Father        spot on lung; had lung surgery  . Alcohol abuse Other   . Hypertension Son   . Diabetes Son   . Emphysema Maternal Grandfather   . Asthma Maternal Grandfather   . Colon cancer Paternal Grandfather   . Stomach cancer Paternal Grandmother   . Neuropathy Neg Hx    Social History   Socioeconomic History  . Marital status: Married    Spouse name: None  . Number of children: None  . Years of education: None  . Highest education level: None  Social Needs  . Financial resource strain: None  . Food insecurity - worry: None  . Food insecurity - inability: None  . Transportation needs - medical: None  . Transportation needs - non-medical: None  Occupational History  . Occupation: Retired  Tobacco Use  . Smoking status: Never Smoker  . Smokeless tobacco: Never Used  Substance and Sexual Activity  . Alcohol use: Yes    Alcohol/week: 0.0 oz    Comment: very rare  . Drug use: No  . Sexual activity: Yes    Partners: Male    Birth control/protection: Surgical    Comment: hysterectomy  Other Topics Concern  . None  Social History Narrative   She lives with husband in [place] home.  They have one grown son and 2 grandchildren.   She is retired [employer].   Highest of level education:  Some college.   Never smoker.   Alcohol: rare.    Tobacco Counseling Counseling given: Not Answered     Activities of Daily Living In your present state of health, do you  have any difficulty performing the following activities: 09/26/2017  Hearing? N  Vision? N  Difficulty concentrating or making decisions? N  Walking or climbing stairs? N  Dressing or bathing? N  Doing errands, shopping? N  Preparing Food and eating ? N  Using the Toilet? N  In the past six months, have you accidently leaked urine? N  Do you have problems with loss of bowel control? N  Managing your Medications? N  Managing your Finances? N  Housekeeping or managing your Housekeeping? N  Some recent data might be hidden     Immunizations and Health Maintenance Immunization History  Administered Date(s) Administered  . Influenza Split 08/17/2012  . Influenza Whole 06/30/2009, 07/12/2010  . Influenza, High Dose Seasonal PF 08/29/2015, 09/09/2016, 08/13/2017  . Influenza,inj,Quad PF,6+ Mos 06/08/2013, 08/18/2014  . Pneumococcal Conjugate-13 08/18/2014  . Pneumococcal Polysaccharide-23 11/26/2016  . Td 09/30/2008  . Zoster 08/17/2012   There are no preventive care reminders to display for this patient.  Patient Care Team: Tammi Sou, MD as PCP - General (Family Medicine) Rogene Houston, MD (Gastroenterology) Stanford Breed Denice Bors, MD as Consulting Physician (Cardiology) Clent Jacks, MD as Consulting Physician (Ophthalmology) Megan Salon, MD (Obstetrics and Gynecology) Sypher, Herbie Baltimore, MD (Inactive) (Orthopedic Surgery) Levy Sjogren, MD as Referring Physician (Dermatology)  Indicate any recent Medical Services you may have received from other than [facility] providers in the past year (date may be approximate).     Assessment:   This is a routine wellness examination for Yasmeen.  Hearing/Vision screen Hearing Screening Comments: Able to hear conversational tones w/o difficulty. No issues reported.   Vision Screening Comments: Last exam 06/17/2017, yearly. Dr. [name] Fitch. Wears glasses.   Dietary issues and exercise activities discussed: Current Exercise  Habits: Home exercise routine(Housekeeping; stairs), Time (Minutes): 30, Frequency (Times/Week): 5, Weekly Exercise (Minutes/Week): 150, Exercise limited by: None identified   Diet (meal preparation, eat out, water intake, caffeinated beverages, dairy products, fruits and vegetables): Drinks splenda tea and water.    Eats at least 3 meals/day.  Discussed heart healthy diet and increasing activity.   Goals    . Weight (lb) < 200 lb (90.7 kg)     Lose weight by watching diet.       Depression Screen PHQ 2/9 Scores 09/26/2017 08/13/2017 06/09/2014 06/08/2013  PHQ - 2 Score 2 0 0 0  PHQ- 9 Score 11 - - -    Patient discussed issues with immediate family after death of father. Pt reports medication ordered by PCP today.   Fall Risk Fall Risk  09/26/2017 08/13/2017 05/31/2016 06/09/2014 06/08/2013  Falls in the past year? No No No No No  Comment - - Emmi Telephone Survey: data to providers prior to load - -    Cognitive Function:       Ad8 score reviewed for issues:  Issues making decisions: no  Less interest in hobbies / activities: no  Repeats questions, stories (family complaining): no  Trouble using ordinary gadgets (microwave, computer, phone): no  Forgets the month or year: no  Mismanaging finances: no  Remembering appts: no  Daily problems with thinking and/or memory: no Ad8 score is=0     Screening Tests Health Maintenance  Topic Date Due  . URINE MICROALBUMIN  12/18/2017  . HEMOGLOBIN A1C  12/23/2017  . OPHTHALMOLOGY EXAM  06/17/2018  . FOOT EXAM  08/13/2018  . TETANUS/TDAP  09/30/2018  . MAMMOGRAM  09/18/2019  . COLONOSCOPY  08/03/2025  . INFLUENZA VACCINE  Completed  . DEXA SCAN  Completed  . Hepatitis C Screening  Completed  . PNA vac Low Risk Adult  Completed  Plan:      Shingles vaccine at pharmacy.   Bring a copy of your living will and/or healthcare power of attorney to your next office visit.  Continue doing brain stimulating  activities (puzzles, reading, adult coloring books, staying active) to keep memory sharp.   I have personally reviewed and noted the following in the patient's chart:   . Medical and social history . Use of alcohol, tobacco or illicit drugs  . Current medications and supplements . Functional ability and status . Nutritional status . Physical activity . Advanced directives . List of other physicians . Hospitalizations, surgeries, and ER visits in previous 12 months . Vitals . Screenings to include cognitive, depression, and falls . Referrals and appointments  In addition, I have reviewed and discussed with patient certain preventive protocols, quality metrics, and best practice recommendations. A written personalized care plan for preventive services as well as general preventive health recommendations were provided to patient.     Gerilyn Nestle, RN   09/26/2017    PCP Notes:  -PHQ9=11. Pt shared issues going on with family since death of her father 4 years ago. Became tearful during discussion. (FYI)

## 2017-09-26 NOTE — Progress Notes (Signed)
OFFICE VISIT  09/26/2017   CC:  Chief Complaint  Patient presents with  . Follow-up    RCI, pt is not fasting.   HPI:    Patient is a 71 y.o. Caucasian female who presents for 6 week f/u DM 2, morbid obesity, HTN, and hepatic steatosis.  I referred her to Dr. Bobbe Medico loss clinic last visit to discuss difficulties losing wt and possible medication treatment to help lose wt.  She has not been contacted about this.  DM 2:  Last visit I increased her BF U 500 to 108 and lunchtime 500 to 115 U.   Supper U500 120.  Has not been giving many doses of U500 Glucoses avg fasting 130,  2H PP supper 200 avg. Hs varies the wildest--130 to 326. Still on trulicity 1.5 per week. 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  She asks about increasing dose.  No signif sedation on current dosing.  HTN: Occ bp check, usually gets <140/90.    Anxiety/nerves + depression: x 4-6 mo, worsening gradually Irritation, depressed mood, +anhedonia, no motivation, withdrawing from people, no SI or HI, tense/keyed up a lot, not sleeping well.  No panic attacks.  Husband chronically ill.  She admits that he is the source of much of her frustration. Has not been on an antidepressant in the past.  No hx of benzo use. Denies alcohol or drug use.   Past Medical History:  Diagnosis Date  . Anxiety   . 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 Fibromyalgia   . GERD   . Hepatic steatosis    Viral Hep screens NEG.  CT 2015.  Transaminasemia.  Marland Kitchen History of thrombocytopenia 12/2011  . Hyperlipidemia    Intolerant of statins  . HYPERTENSION   . IDDM (insulin dependent diabetes mellitus) ([place])    with DPN (managed by Dr. Cruzita Lederer but then in 2018 pt preferred to have me manage for her convenience)  . Morbid obesity ([place])   . OSA (obstructive sleep apnea) 09/14/2015   sleep study 09/07/15: severe obstructive sleep apnea with an AHI  of 72 and SaO2 low of 75%.>refer to sleep med for eval and tx options  . Osteoarthritis    hips, shoulders, knees  . PAF (paroxysmal atrial fibrillation) ([place])    One documented episode (after getting EGD 2016).  Was on amiodarone x 3 mo.  Rate control with metoprolol + anticoag with xarelto.   . Small fiber neuropathy    Due to DM.  Symmetric hands and feet tingling/numbness.  Marland Kitchen Ulcerative colitis ([place])    Remicade infusion Q 8 weeks    Past Surgical History:  Procedure Laterality Date  . ABDOMINAL HYSTERECTOMY  1980   partial  . BACTERIAL OVERGROWTH TEST N/A 07/13/2015   Procedure: BACTERIAL OVERGROWTH TEST;  Surgeon: Rogene Houston, MD;  [facility];  Service: Endoscopy;  Laterality: N/A;  730    . BILATERAL SALPINGOOPHORECTOMY  02/10/2001  . BREAST REDUCTION SURGERY  1994  . CARDIOVASCULAR STRESS TEST  07/2010   Lexiscan myoview: normal  . CARPAL TUNNEL RELEASE Right 1996  . CARPAL TUNNEL RELEASE Left 03/21/2003  . CARPAL TUNNEL RELEASE Right 05/04/2013   Procedure: CARPAL TUNNEL RELEASE;  Surgeon: Cammie Sickle., MD;  Location: [facility] [place] [facility];  Service: Orthopedics;  Laterality: Right;  . CARPAL TUNNEL RELEASE Left 09/21/2013   Procedure: LEFT CARPAL TUNNEL RELEASE;  Surgeon: Cammie Sickle., MD;  Location: [facility];  Service: Orthopedics;  Laterality: Left;  . CHOLECYSTECTOMY    . COLONOSCOPY WITH PROPOFOL N/A 08/04/2015   Colitis in remission.  No polyps.  Procedure: COLONOSCOPY WITH PROPOFOL;  Surgeon: Rogene Houston, MD;  Location: [facility];  Service: Endoscopy;  Laterality: N/A;  cecum time in  0820   time out  0827    total time 7 minutes  . ESOPHAGEAL DILATION N/A 08/04/2015   Procedure: ESOPHAGEAL DILATION;  Surgeon: Rogene Houston, MD;  Location: [facility];  Service: Endoscopy;  Laterality: N/A;  [place] 57, no mucousal disruption  . ESOPHAGOGASTRODUODENOSCOPY (EGD) WITH ESOPHAGEAL DILATION  12/02/2005  .  ESOPHAGOGASTRODUODENOSCOPY (EGD) WITH PROPOFOL N/A 08/04/2015   Procedure: ESOPHAGOGASTRODUODENOSCOPY (EGD) WITH PROPOFOL;  Surgeon: Rogene Houston, MD;  Location: [facility];  Service: Endoscopy;  Laterality: N/A;  procedure 1  . FLEXIBLE SIGMOIDOSCOPY  01/17/2012   Procedure: FLEXIBLE SIGMOIDOSCOPY;  Surgeon: Rogene Houston, MD;  [facility];  Service: Endoscopy;  Laterality: N/A;  . HEMILAMINOTOMY LUMBAR SPINE Bilateral 09/07/1999   L4-5  . KNEE ARTHROSCOPY Right 01/1999; 10/2000  . LYSIS OF ADHESION  02/10/2001  . [place]; 09/12/2006  . REDUCTION MAMMAPLASTY Bilateral   . TARSAL TUNNEL RELEASE  2002  . TRANSTHORACIC ECHOCARDIOGRAM  08/04/2015   EF 60-65%, normal wall motion, mild LVH, mild LA dilation, grd I DD.  Marland Kitchen TUMOR EXCISION Left 03/21/2003   dorsal 1st web space (hand)  . URETEROLYSIS Right 02/10/2001    Outpatient Medications Prior to Visit  Medication Sig Dispense Refill  . Dulaglutide (TRULICITY) 1.5 EZ/6.6QH SOPN Inject under skin 1.5 mg weekly under skin. 12 pen 1  . esomeprazole (NEXIUM) 20 MG capsule Take 20 mg by mouth daily at 12 noon.    . furosemide (LASIX) 40 MG tablet Take 1 tablet (40 mg total) by mouth daily. 90 tablet 2  . gabapentin (NEURONTIN) 600 MG tablet TAKE 1 TABLET BY MOUTH TWICE A DAY 60 tablet 6  . glucose blood (ONE TOUCH ULTRA TEST) test strip Use as instructed to check sugar 4 times daily 400 each 5  . glucose blood test strip Use as instructed to test blood sugar four times per day. DX E11.09 (Patient taking differently: Use as instructed to test blood sugar 6 times per day. DX E11.09) 500 each 1  . HUMULIN R U-500 KWIKPEN 500 UNIT/ML kwikpen INJECT 80-120 UNITS 3X A DAY 30 pen 3  . inFLIXimab (REMICADE) 100 MG injection Inject into the vein every 8 (eight) weeks.     . Insulin Pen Needle 31G X 5 MM MISC Use to inject insulins equal to 6 times daily. 600 each 5  . lactase (LACTAID) 3000 UNITS tablet Take 3,000 Units by mouth as  needed (when eating foods containing dairy).     Marland Kitchen loperamide (IMODIUM) 2 MG capsule Take 2 mg by mouth 3 (three) times daily as needed for diarrhea or loose stools.     . meclizine (ANTIVERT) 25 MG tablet Take 1 tablet (25 mg total) by mouth 3 (three) times daily as needed for dizziness or nausea. 90 tablet 0  . metoprolol succinate (TOPROL-XL) 100 MG 24 hr tablet TAKE 1 TABLET BY MOUTH 2 TIMES DAILY. TAKE WITH OR IMMEDIATELY FOLLOWING A MEAL 180 tablet 3  . ONETOUCH DELICA LANCETS 47M MISC PATIENT USES ONE TOUCH DELICAL 54Y LANCETS. Use as directed to check blood sugar up to 6 times  per day. DX: E11.09 500 each 3  . potassium chloride (KLOR-CON M10) 10 MEQ tablet Take 2 tablets (20 mEq total) by mouth daily. 180 tablet 3  . vitamin B-12 (CYANOCOBALAMIN) 1000 MCG tablet Take 1,000 mcg by mouth daily.    Alveda Reasons 20 MG TABS tablet TAKE 1 TABLET (20 MG TOTAL) BY MOUTH DAILY WITH SUPPER. 90 tablet 1   No facility-administered medications prior to visit.     Allergies  Allergen Reactions  . Actos [Pioglitazone] Other (See Comments)    Weight gain  . Benzocaine-Menthol Swelling    SWELLING OF MOUTH  . Colesevelam Other (See Comments)    GI UPSET  . Flagyl [Metronidazole Hcl] Other (See Comments)    DIAPHORESIS  . Metformin And Related Diarrhea  . Omeprazole Swelling    SWELLING OF TONGUE AND THROAT  . Shrimp [Shellfish Allergy] Itching    OF THROAT AND EARS  . Statins Other (See Comments)    HEART RACING  . Desipramine Hcl Itching, Nausea Only and Other (See Comments)    "swimmy" headed, ears itched   . Hydromorphone Itching  . Jardiance [Empagliflozin] Other (See Comments)    weakness  . Adhesive [Tape] Other (See Comments)    SKIN IRRITATION AND BRUISING  . Nisoldipine Itching  . Percocet [Oxycodone-Acetaminophen] Itching    ROS As per HPI  PE: Blood pressure (!) 144/84, pulse 67, temperature 98 F (36.7 C), temperature source Oral, resp. rate 16, height 5' 1"  (1.549 m),  weight 221 lb 8 oz (100.5 kg), SpO2 96 %. Body mass index is 41.85 kg/m.  Gen: Alert, well appearing.  Patient is oriented to person, place, time, and situation. AFFECT: pleasant, lucid thought and speech. She tears-up towards the end of describing her struggles with anxiety/depression lately.  LABS:  Lab Results  Component Value Date   HGBA1C 7.4 (H) 06/25/2017     Chemistry      Component Value Date/Time   NA 137 06/25/2017 1344   K 4.1 06/25/2017 1344   CL 100 06/25/2017 1344   CO2 30 06/25/2017 1344   BUN 11 06/25/2017 1344   CREATININE 0.68 06/25/2017 1344   CREATININE 0.95 11/03/2015 1625      Component Value Date/Time   CALCIUM 9.4 06/25/2017 1344   ALKPHOS 76 06/25/2017 1344   AST 161 (H) 06/25/2017 1344   ALT 118 (H) 06/25/2017 1344   BILITOT 0.6 06/25/2017 1344     Lab Results  Component Value Date   CHOL 230 (H) 08/29/2015   HDL 42.90 08/29/2015   LDLCALC 153 (H) 08/29/2015   LDLDIRECT 69.5 01/24/2011   TRIG 175.0 (H) 08/29/2015   CHOLHDL 5 08/29/2015     IMPRESSION AND PLAN:  1) MDD with significant anxiety features.  Single episode, w/out psychosis. Start citalopram 50m qd and ativan 0.563m 1-2 bid prn. Therapeutic expectations and side effect profile of medication discussed today.  Patient's questions answered. She will consider counseling but declined this right now.  2) Morbid obesity: will make sure she gets Dr. BeMigdalia Dkontact info (referral was ordered last o/v, but pt states she did not receive the VM to call their office to schedule appt).    3) DM 2, control a little bit improved over the last month. Check A1c today. No changes in insulin regimen until A1c result is back.  4) Hepatic steatosis: recheck hepatic panel today.  If transaminases rising I'll recheck a RUQ ultrasound.  5) HTN; The current medical regimen is effective;  continue present plan and medications. Lytes/cr today.  6) Chronic hands/feet neuropathic pain: increase  gabapentin to 900 mg bid and if not improved signif in 1 week then increase again to 1200 mg bid. Therapeutic expectations and side effect profile of medication discussed today.  Patient's questions answered.  An After Visit Summary was printed and given to the patient.   FOLLOW UP: 1 mo recheck dep/anx and neuropathic pain.  Signed:  Crissie Sickles, MD           09/26/2017

## 2017-09-26 NOTE — Patient Instructions (Addendum)
Increase your gabapentin to THREE of the 300 mg tabs every morning and every evening. If not significantly improved in 7d, then increase to FOUR of the 300 mg tabs every morning and every evening.  Shingles vaccine at pharmacy.   Bring a copy of your living will and/or healthcare power of attorney to your next office visit.  Continue doing brain stimulating activities (puzzles, reading, adult coloring books, staying active) to keep memory sharp.    Health Maintenance, Female Adopting a healthy lifestyle and getting preventive care can go a long way to promote health and wellness. Talk with your health care provider about what schedule of regular examinations is right for you. This is a good chance for you to check in with your provider about disease prevention and staying healthy. In between checkups, there are plenty of things you can do on your own. Experts have done a lot of research about which lifestyle changes and preventive measures are most likely to keep you healthy. Ask your health care provider for more information. Weight and diet Eat a healthy diet  Be sure to include plenty of vegetables, fruits, low-fat dairy products, and lean protein.  Do not eat a lot of foods high in solid fats, added sugars, or salt.  Get regular exercise. This is one of the most important things you can do for your health. ? Most adults should exercise for at least 150 minutes each week. The exercise should increase your heart rate and make you sweat (moderate-intensity exercise). ? Most adults should also do strengthening exercises at least twice a week. This is in addition to the moderate-intensity exercise.  Maintain a healthy weight  Body mass index (BMI) is a measurement that can be used to identify possible weight problems. It estimates body fat based on height and weight. Your health care provider can help determine your BMI and help you achieve or maintain a healthy weight.  For females 70  years of age and older: ? A BMI below 18.5 is considered underweight. ? A BMI of 18.5 to 24.9 is normal. ? A BMI of 25 to 29.9 is considered overweight. ? A BMI of 30 and above is considered obese.  Watch levels of cholesterol and blood lipids  You should start having your blood tested for lipids and cholesterol at 70 years of age, then have this test every 70 years.  You may need to have your cholesterol levels checked more often if: ? Your lipid or cholesterol levels are high. ? You are older than 70 years of age. ? You are at high risk for heart disease.  Cancer screening Lung Cancer  Lung cancer screening is recommended for adults 70-70 years old who are at high risk for lung cancer because of a history of smoking.  A yearly low-dose CT scan of the lungs is recommended for people who: ? Currently smoke. ? Have quit within the past 15 years. ? Have at least a 30-pack-year history of smoking. A pack year is smoking an average of one pack of cigarettes a day for 1 year.  Yearly screening should continue until it has been 15 years since you quit.  Yearly screening should stop if you develop a health problem that would prevent you from having lung cancer treatment.  Breast Cancer  Practice breast self-awareness. This means understanding how your breasts normally appear and feel.  It also means doing regular breast self-exams. Let your health care provider know about any changes, no matter how small.  If you are in your 20s or 30s, you should have a clinical breast exam (CBE) by a health care provider every 1-3 years as part of a regular health exam.  If you are 15 or older, have a CBE every year. Also consider having a breast X-ray (mammogram) every year.  If you have a family history of breast cancer, talk to your health care provider about genetic screening.  If you are at high risk for breast cancer, talk to your health care provider about having an MRI and a mammogram every  year.  Breast cancer gene (BRCA) assessment is recommended for women who have family members with BRCA-related cancers. BRCA-related cancers include: ? Breast. ? Ovarian. ? Tubal. ? Peritoneal cancers.  Results of the assessment will determine the need for genetic counseling and BRCA1 and BRCA2 testing.  Cervical Cancer Your health care provider may recommend that you be screened regularly for cancer of the pelvic organs (ovaries, uterus, and vagina). This screening involves a pelvic examination, including checking for microscopic changes to the surface of your cervix (Pap test). You may be encouraged to have this screening done every 3 years, beginning at age 70.  For women ages 70-70, health care providers may recommend pelvic exams and Pap testing every 3 years, or they may recommend the Pap and pelvic exam, combined with testing for human papilloma virus (HPV), every 5 years. Some types of HPV increase your risk of cervical cancer. Testing for HPV may also be done on women of any age with unclear Pap test results.  Other health care providers may not recommend any screening for nonpregnant women who are considered low risk for pelvic cancer and who do not have symptoms. Ask your health care provider if a screening pelvic exam is right for you.  If you have had past treatment for cervical cancer or a condition that could lead to cancer, you need Pap tests and screening for cancer for at least 20 years after your treatment. If Pap tests have been discontinued, your risk factors (such as having a new sexual partner) need to be reassessed to determine if screening should resume. Some women have medical problems that increase the chance of getting cervical cancer. In these cases, your health care provider may recommend more frequent screening and Pap tests.  Colorectal Cancer  This type of cancer can be detected and often prevented.  Routine colorectal cancer screening usually begins at 70  years of age and continues through 70 years of age.  Your health care provider may recommend screening at an earlier age if you have risk factors for colon cancer.  Your health care provider may also recommend using home test kits to check for hidden blood in the stool.  A small camera at the end of a tube can be used to examine your colon directly (sigmoidoscopy or colonoscopy). This is done to check for the earliest forms of colorectal cancer.  Routine screening usually begins at age 29.  Direct examination of the colon should be repeated every 5-10 years through 70 years of age. However, you may need to be screened more often if early forms of precancerous polyps or small growths are found.  Skin Cancer  Check your skin from head to toe regularly.  Tell your health care provider about any new moles or changes in moles, especially if there is a change in a mole's shape or color.  Also tell your health care provider if you have a mole that is  larger than the size of a pencil eraser.  Always use sunscreen. Apply sunscreen liberally and repeatedly throughout the day.  Protect yourself by wearing long sleeves, pants, a wide-brimmed hat, and sunglasses whenever you are outside.  Heart disease, diabetes, and high blood pressure  High blood pressure causes heart disease and increases the risk of stroke. High blood pressure is more likely to develop in: ? People who have blood pressure in the high end of the normal range (130-139/85-89 mm Hg). ? People who are overweight or obese. ? People who are African American.  If you are 40-37 years of age, have your blood pressure checked every 3-5 years. If you are 83 years of age or older, have your blood pressure checked every year. You should have your blood pressure measured twice-once when you are at a hospital or clinic, and once when you are not at a hospital or clinic. Record the average of the two measurements. To check your blood pressure  when you are not at a hospital or clinic, you can use: ? An automated blood pressure machine at a pharmacy. ? A home blood pressure monitor.  If you are between 62 years and 70 years old, ask your health care provider if you should take aspirin to prevent strokes.  Have regular diabetes screenings. This involves taking a blood sample to check your fasting blood sugar level. ? If you are at a normal weight and have a low risk for diabetes, have this test once every three years after 70 years of age. ? If you are overweight and have a high risk for diabetes, consider being tested at a younger age or more often. Preventing infection Hepatitis B  If you have a higher risk for hepatitis B, you should be screened for this virus. You are considered at high risk for hepatitis B if: ? You were born in a country where hepatitis B is common. Ask your health care provider which countries are considered high risk. ? Your parents were born in a high-risk country, and you have not been immunized against hepatitis B (hepatitis B vaccine). ? You have HIV or AIDS. ? You use needles to inject street drugs. ? You live with someone who has hepatitis B. ? You have had sex with someone who has hepatitis B. ? You get hemodialysis treatment. ? You take certain medicines for conditions, including cancer, organ transplantation, and autoimmune conditions.  Hepatitis C  Blood testing is recommended for: ? Everyone born from [birth year] through 1964. ? Anyone with known risk factors for hepatitis C.  Sexually transmitted infections (STIs)  You should be screened for sexually transmitted infections (STIs) including gonorrhea and chlamydia if: ? You are sexually active and are younger than 70 years of age. ? You are older than 70 years of age and your health care provider tells you that you are at risk for this type of infection. ? Your sexual activity has changed since you were last screened and you are at an increased  risk for chlamydia or gonorrhea. Ask your health care provider if you are at risk.  If you do not have HIV, but are at risk, it may be recommended that you take a prescription medicine daily to prevent HIV infection. This is called pre-exposure prophylaxis (PrEP). You are considered at risk if: ? You are sexually active and do not regularly use condoms or know the HIV status of your partner(s). ? You take drugs by injection. ? You are sexually active  with a partner who has HIV.  Talk with your health care provider about whether you are at high risk of being infected with HIV. If you choose to begin PrEP, you should first be tested for HIV. You should then be tested every 3 months for as long as you are taking PrEP. Pregnancy  If you are premenopausal and you may become pregnant, ask your health care provider about preconception counseling.  If you may become pregnant, take 400 to 800 micrograms (mcg) of folic acid every day.  If you want to prevent pregnancy, talk to your health care provider about birth control (contraception). Osteoporosis and menopause  Osteoporosis is a disease in which the bones lose minerals and strength with aging. This can result in serious bone fractures. Your risk for osteoporosis can be identified using a bone density scan.  If you are 30 years of age or older, or if you are at risk for osteoporosis and fractures, ask your health care provider if you should be screened.  Ask your health care provider whether you should take a calcium or vitamin D supplement to lower your risk for osteoporosis.  Menopause may have certain physical symptoms and risks.  Hormone replacement therapy may reduce some of these symptoms and risks. Talk to your health care provider about whether hormone replacement therapy is right for you. Follow these instructions at home:  Schedule regular health, dental, and eye exams.  Stay current with your immunizations.  Do not use any  tobacco products including cigarettes, chewing tobacco, or electronic cigarettes.  If you are pregnant, do not drink alcohol.  If you are breastfeeding, limit how much and how often you drink alcohol.  Limit alcohol intake to no more than 1 drink per day for nonpregnant women. One drink equals 12 ounces of beer, 5 ounces of wine, or 1 ounces of hard liquor.  Do not use street drugs.  Do not share needles.  Ask your health care provider for help if you need support or information about quitting drugs.  Tell your health care provider if you often feel depressed.  Tell your health care provider if you have ever been abused or do not feel safe at home. This information is not intended to replace advice given to you by your health care provider. Make sure you discuss any questions you have with your health care provider. Document Released: 04/01/2011 Document Revised: 02/22/2016 Document Reviewed: 06/20/2015 Elsevier Interactive Patient Education  [employer].

## 2017-10-03 ENCOUNTER — Other Ambulatory Visit: Payer: Self-pay | Admitting: *Deleted

## 2017-10-03 MED ORDER — GABAPENTIN 300 MG PO CAPS: ORAL_CAPSULE | ORAL | 1 refills | Status: DC

## 2017-10-03 NOTE — Telephone Encounter (Signed)
Rx was sent on 09/26/17 for #120 w/ 1RF. Pharmacy faxed in today request Rx for 90 day supply. Please advise. Thanks.

## 2017-10-08 NOTE — Progress Notes (Signed)
AWV reviewed and agree. Signed:  Crissie Sickles, MD           10/08/2017

## 2017-10-16 ENCOUNTER — Encounter: Payer: Self-pay | Admitting: Family Medicine

## 2017-10-27 ENCOUNTER — Ambulatory Visit: Payer: Medicare Other | Admitting: Family Medicine

## 2017-10-27 ENCOUNTER — Encounter: Payer: Self-pay | Admitting: Family Medicine

## 2017-10-27 VITALS — BP 144/80 | HR 71 | Temp 98.2°F | Wt 218.0 lb

## 2017-10-27 DIAGNOSIS — F320 Major depressive disorder, single episode, mild: Secondary | ICD-10-CM

## 2017-10-27 DIAGNOSIS — M792 Neuralgia and neuritis, unspecified: Secondary | ICD-10-CM

## 2017-10-27 DIAGNOSIS — E118 Type 2 diabetes mellitus with unspecified complications: Secondary | ICD-10-CM

## 2017-10-27 DIAGNOSIS — Z794 Long term (current) use of insulin: Secondary | ICD-10-CM

## 2017-10-27 MED ORDER — GABAPENTIN 300 MG PO CAPS: ORAL_CAPSULE | ORAL | 3 refills | Status: DC

## 2017-10-27 MED ORDER — CITALOPRAM HYDROBROMIDE 20 MG PO TABS: 20.0000 mg | ORAL_TABLET | Freq: Every day | ORAL | 1 refills | Status: DC

## 2017-10-27 NOTE — Progress Notes (Signed)
OFFICE VISIT  10/27/2017   CC:  Chief Complaint  Patient presents with  . Anxiety    follow up    HPI:    Patient is a 72 y.o. Caucasian female who presents for 1 mo f/u MDD w/significant anxiety as well.  Also f/u DM 2 and peripheral neuropathic pain.  I started her on citalopram 82m qd as well as ativan 0.5, 1-2 bid prn. She has responded very well to citalopram: much improved overall outlook on things, better control of her emotions, less irritable, less seclusion, no HI or SI.  Anxiety is less as well.  Much less irritable and not easily frustrated like before.  Her husband wanted her to tell me "don't take her off that med"--b/c he is very happy with the changes he has noted in her since being on it. No lorazepam has been needed. No side effects from citalopram.  She says 75% improvement, felt things begin to improve about 2 wks ago.    DM 2: last visit her A1c was 7.1%.  I recommended she add 2 U of [facility]  This would be 110, 117, and 122. Glucose review today showed avg fasting still about 140,   Peripheral neuropathic pain: I increased her gabapentin dosing last visit due to uncontrolled hands/feet neuropathic pain--increased to 900 mg bid. This is improved.  Just a bit of tingling/loss of sensation/burning in tips of fingers, but it does not wake her from sleep or keep her from sleeping.  Feet asymptomatic now!  She is satisfied with the current dosing.  ROS: no tremulousness, no dizziness, no rash, no fevers, no hypersomnolence, no melena or hematochezia.   Past Medical History:  Diagnosis Date  . Anxiety   . 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 Fibromyalgia   . GERD   . Hepatic steatosis    Viral Hep screens NEG.  CT 2015.  Transaminasemia.  .Marland KitchenHistory of thrombocytopenia 12/2011  . Hyperlipidemia    Intolerant of statins  . HYPERTENSION   . IDDM (insulin dependent diabetes mellitus) ([place]    with DPN (managed by Dr. GCruzita Ledererbut then in 2018 pt preferred to have me manage for her convenience)  . Limited mobility    Requires a walker for arthritic pain, widespread musculoskeletal pain, and neuropathic pain.  . Morbid obesity ([place]   . OSA (obstructive sleep apnea) 09/14/2015   sleep study 09/07/15: severe obstructive sleep apnea with an AHI of 72 and SaO2 low of 75%.>refer to sleep med for eval and tx options  . Osteoarthritis    hips, shoulders, knees  . PAF (paroxysmal atrial fibrillation) ([place]    One documented episode (after getting EGD 2016).  Was on amiodarone x 3 mo.  Rate control with metoprolol + anticoag with xarelto.   . Small fiber neuropathy    Due to DM.  Symmetric hands and feet tingling/numbness.  .Marland KitchenUlcerative colitis ([place]    Remicade infusion Q 8 weeks    Past Surgical History:  Procedure Laterality Date  . ABDOMINAL HYSTERECTOMY  1980   partial  . BACTERIAL OVERGROWTH TEST N/A 07/13/2015   Procedure: BACTERIAL OVERGROWTH TEST;  Surgeon: NRogene Houston MD;  [facility];  Service: Endoscopy;  Laterality: N/A;  730    . BILATERAL SALPINGOOPHORECTOMY  02/10/2001  . BREAST REDUCTION SURGERY  1994   bilat  . CARDIOVASCULAR STRESS TEST  07/2010   [place]  myoview: normal  . CARPAL TUNNEL RELEASE Right 1996  . CARPAL TUNNEL RELEASE Left 03/21/2003  . CARPAL TUNNEL RELEASE Right 05/04/2013   Procedure: CARPAL TUNNEL RELEASE;  Surgeon: Cammie Sickle., MD;  Location: [place];  Service: Orthopedics;  Laterality: Right;  . CARPAL TUNNEL RELEASE Left 09/21/2013   Procedure: LEFT CARPAL TUNNEL RELEASE;  Surgeon: Cammie Sickle., MD;  Location: [place];  Service: Orthopedics;  Laterality: Left;  . CHOLECYSTECTOMY    . COLONOSCOPY WITH PROPOFOL N/A 08/04/2015   Colitis in remission.  No polyps.  Procedure: COLONOSCOPY WITH PROPOFOL;  Surgeon: Rogene Houston, MD;  [facility];  Service: Endoscopy;   Laterality: N/A;  cecum time in  0820   time out  0827    total time 7 minutes  . ESOPHAGEAL DILATION N/A 08/04/2015   Procedure: ESOPHAGEAL DILATION;  Surgeon: Rogene Houston, MD;  [facility];  Service: Endoscopy;  Laterality: N/A;  [place] 57, no mucousal disruption  . ESOPHAGOGASTRODUODENOSCOPY (EGD) WITH ESOPHAGEAL DILATION  12/02/2005  . ESOPHAGOGASTRODUODENOSCOPY (EGD) WITH PROPOFOL N/A 08/04/2015   Procedure: ESOPHAGOGASTRODUODENOSCOPY (EGD) WITH PROPOFOL;  Surgeon: Rogene Houston, MD;  [facility];  Service: Endoscopy;  Laterality: N/A;  procedure 1  . FLEXIBLE SIGMOIDOSCOPY  01/17/2012   Procedure: FLEXIBLE SIGMOIDOSCOPY;  Surgeon: Rogene Houston, MD;  [facility];  Service: Endoscopy;  Laterality: N/A;  . HEMILAMINOTOMY LUMBAR SPINE Bilateral 09/07/1999   L4-5  . KNEE ARTHROSCOPY Right 01/1999; 10/2000  . LYSIS OF ADHESION  02/10/2001  . [place]; 09/12/2006  . TARSAL TUNNEL RELEASE  2002  . TRANSTHORACIC ECHOCARDIOGRAM  08/04/2015   EF 60-65%, normal wall motion, mild LVH, mild LA dilation, grd I DD.  Marland Kitchen TUMOR EXCISION Left 03/21/2003   dorsal 1st web space (hand)  . URETEROLYSIS Right 02/10/2001    Outpatient Medications Prior to Visit  Medication Sig Dispense Refill  . Dulaglutide (TRULICITY) 1.5 XI/3.3AS SOPN Inject under skin 1.5 mg weekly under skin. 12 pen 1  . esomeprazole (NEXIUM) 20 MG capsule Take 20 mg by mouth daily at 12 noon.    . furosemide (LASIX) 40 MG tablet Take 1 tablet (40 mg total) by mouth daily. 90 tablet 2  . gabapentin (NEURONTIN) 600 MG tablet Take 600 mg by mouth 2 (two) times daily.  6  . glucose blood (ONE TOUCH ULTRA TEST) test strip Use as instructed to check sugar 4 times daily 400 each 5  . glucose blood test strip Use as instructed to test blood sugar four times per day. DX E11.09 (Patient taking differently: Use as instructed to test blood sugar 6 times per day. DX E11.09) 500 each 1  . HUMULIN R U-500 KWIKPEN 500  UNIT/ML kwikpen INJECT 80-120 UNITS 3X A DAY 30 pen 3  . inFLIXimab (REMICADE) 100 MG injection Inject into the vein every 8 (eight) weeks.     . Insulin Pen Needle 31G X 5 MM MISC Use to inject insulins equal to 6 times daily. 600 each 5  . lactase (LACTAID) 3000 UNITS tablet Take 3,000 Units by mouth as needed (when eating foods containing dairy).     Marland Kitchen loperamide (IMODIUM) 2 MG capsule Take 2 mg by mouth 3 (three) times daily as needed for diarrhea or loose stools.     Marland Kitchen LORazepam (ATIVAN) 0.5 MG tablet 1-2 tabs po bid prn moderate to severe anxiety 60 tablet 1  . meclizine (ANTIVERT) 25  MG tablet Take 1 tablet (25 mg total) by mouth 3 (three) times daily as needed for dizziness or nausea. 90 tablet 0  . metoprolol succinate (TOPROL-XL) 100 MG 24 hr tablet TAKE 1 TABLET BY MOUTH 2 TIMES DAILY. TAKE WITH OR IMMEDIATELY FOLLOWING A MEAL 180 tablet 3  . ONETOUCH DELICA LANCETS 49Q MISC PATIENT USES ONE TOUCH DELICAL 75F LANCETS. Use as directed to check blood sugar up to 6 times per day. DX: E11.09 500 each 3  . potassium chloride (KLOR-CON M10) 10 MEQ tablet Take 2 tablets (20 mEq total) by mouth daily. 180 tablet 3  . vitamin B-12 (CYANOCOBALAMIN) 1000 MCG tablet Take 1,000 mcg by mouth daily.    Alveda Reasons 20 MG TABS tablet TAKE 1 TABLET (20 MG TOTAL) BY MOUTH DAILY WITH SUPPER. 90 tablet 1  . citalopram (CELEXA) 20 MG tablet Take 1 tablet (20 mg total) by mouth daily. 30 tablet 1  . gabapentin (NEURONTIN) 300 MG capsule 3-4 caps po bid 720 capsule 1   No facility-administered medications prior to visit.     Allergies  Allergen Reactions  . Actos [Pioglitazone] Other (See Comments)    Weight gain  . Benzocaine-Menthol Swelling    SWELLING OF MOUTH  . Colesevelam Other (See Comments)    GI UPSET  . Flagyl [Metronidazole Hcl] Other (See Comments)    DIAPHORESIS  . Metformin And Related Diarrhea  . Omeprazole Swelling    SWELLING OF TONGUE AND THROAT  . Shrimp [Shellfish Allergy]  Itching    OF THROAT AND EARS  . Statins Other (See Comments)    HEART RACING  . Desipramine Hcl Itching, Nausea Only and Other (See Comments)    "swimmy" headed, ears itched   . Hydromorphone Itching  . Jardiance [Empagliflozin] Other (See Comments)    weakness  . Adhesive [Tape] Other (See Comments)    SKIN IRRITATION AND BRUISING  . Nisoldipine Itching  . Percocet [Oxycodone-Acetaminophen] Itching    ROS As per HPI  PE: Blood pressure (!) 144/80, pulse 71, temperature 98.2 F (36.8 C), temperature source Oral, weight 218 lb (98.9 kg), SpO2 97 %. Gen: Alert, well appearing.  Patient is oriented to person, place, time, and situation. AFFECT: pleasant, lucid thought and speech. No further exam today.  LABS:    Chemistry      Component Value Date/Time   NA 138 09/26/2017 1336   K 4.6 09/26/2017 1336   CL 101 09/26/2017 1336   CO2 32 09/26/2017 1336   BUN 13 09/26/2017 1336   CREATININE 0.70 09/26/2017 1336   CREATININE 0.95 11/03/2015 1625      Component Value Date/Time   CALCIUM 9.1 09/26/2017 1336   ALKPHOS 78 09/26/2017 1336   AST 88 (H) 09/26/2017 1336   ALT 68 (H) 09/26/2017 1336   BILITOT 0.6 09/26/2017 1336     Lab Results  Component Value Date   HGBA1C 7.1 (H) 09/26/2017    IMPRESSION AND PLAN:  1) MDD, with signif anxiety: MUCH improved. Continue citalopram 71m qd.  She has ativan on hand to use prn but has not had to use any.  2) DM 2: some slight improvement in glucoses have occurred over the last 1 mo.  With her large variation in glucoses I am hesitant to adjust her insulins too much---will keep things the same at this time. Recheck A1c in 2 mo.  We briefly discussed alternative glucose monitoring devices b/c she does check her glucose 3-4 times per day.  I  advised her to call her insurer to see if she would get any coverage for this before I rx. Also, she asked about insulin pump.  I told her that this would also likely be a good option for her,  but that would require referral to an endocrinologist for management.  She opted to stay here for ongoing DM mgmt at this time.  3) Peripheral neuropathic pain/paresthesias: responding well to current dose of gabapentin (637m cap + 300 mg cap twice per day, for total dosing of 1800 mg per day).  Continue this.  No adverse side effects.  An After Visit Summary was printed and given to the patient.  FOLLOW UP: Return in about 2 months (around 12/25/2017) for routine chronic illness f/u.  Signed:  PCrissie Sickles MD           10/27/2017

## 2017-11-13 ENCOUNTER — Encounter: Payer: Self-pay | Admitting: Family Medicine

## 2017-11-13 ENCOUNTER — Ambulatory Visit: Payer: Medicare Other | Admitting: Family Medicine

## 2017-11-13 ENCOUNTER — Ambulatory Visit (INDEPENDENT_AMBULATORY_CARE_PROVIDER_SITE_OTHER)
Admission: RE | Admit: 2017-11-13 | Discharge: 2017-11-13 | Disposition: A | Discharge status: Home / Self Care | Payer: Medicare Other | Source: Ambulatory Visit | Attending: Family Medicine | Admitting: Family Medicine

## 2017-11-13 VITALS — BP 135/77 | HR 76 | Temp 97.6°F | Wt 216.0 lb

## 2017-11-13 DIAGNOSIS — S5011XA Contusion of right forearm, initial encounter: Secondary | ICD-10-CM | POA: Diagnosis not present

## 2017-11-13 DIAGNOSIS — S40021A Contusion of right upper arm, initial encounter: Secondary | ICD-10-CM | POA: Diagnosis not present

## 2017-11-13 DIAGNOSIS — Z7901 Long term (current) use of anticoagulants: Secondary | ICD-10-CM

## 2017-11-13 DIAGNOSIS — M79631 Pain in right forearm: Secondary | ICD-10-CM

## 2017-11-13 LAB — CBC
HCT: 37.7 % (ref 36.0–46.0)
HEMOGLOBIN: 12.6 g/dL (ref 12.0–15.0)
MCHC: 33.5 g/dL (ref 30.0–36.0)
MCV: 92.2 fl (ref 78.0–100.0)
PLATELETS: 167 10*3/uL (ref 150.0–400.0)
RBC: 4.09 Mil/uL (ref 3.87–5.11)
RDW: 14.6 % (ref 11.5–15.5)
WBC: 5.7 10*3/uL (ref 4.0–10.5)

## 2017-11-13 NOTE — Progress Notes (Signed)
OFFICE VISIT  11/19/2017   CC:  Chief Complaint  Patient presents with  . Arm Pain    Right arm swelling and warm to touch. Fell Feb. 6.   HPI:    Patient is a 72 y.o. Caucasian female who presents for right arm swelling after a recent fall. Was helping husband move heavy object, fell forward onto R arm.  This occurred 1 week ago today.  She did not hit her head. Pain improving except for proximal R forearm.  She got an abrasion on R forearm, has a lot of bruising and swelling in R forearm.  The forearm swelling is diminished and is now more localized.  No new neuro sx's in hands (has chronic tingling/numbness in both hands).  No pallor or cyanosis of hands/fingers.    Of note, she does take xarelto for PAF.  Past Medical History:  Diagnosis Date  . Anxiety   . 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 Fibromyalgia   . GERD   . Hepatic steatosis    Viral Hep screens NEG.  CT 2015.  Transaminasemia.  Marland Kitchen History of thrombocytopenia 12/2011  . Hyperlipidemia    Intolerant of statins  . HYPERTENSION   . IDDM (insulin dependent diabetes mellitus) ([place])    with DPN (managed by Dr. Cruzita Lederer but then in 2018 pt preferred to have me manage for her convenience)  . Limited mobility    Requires a walker for arthritic pain, widespread musculoskeletal pain, and neuropathic pain.  . Morbid obesity ([place])   . OSA (obstructive sleep apnea) 09/14/2015   sleep study 09/07/15: severe obstructive sleep apnea with an AHI of 72 and SaO2 low of 75%.>refer to sleep med for eval and tx options  . Osteoarthritis    hips, shoulders, knees  . PAF (paroxysmal atrial fibrillation) ([place])    One documented episode (after getting EGD 2016).  Was on amiodarone x 3 mo.  Rate control with metoprolol + anticoag with xarelto.   . Small fiber neuropathy    Due to DM.  Symmetric hands and feet tingling/numbness.  Marland Kitchen Ulcerative colitis ([place])    Remicade infusion Q 8 weeks    Past Surgical History:   Procedure Laterality Date  . ABDOMINAL HYSTERECTOMY  1980   partial  . BACTERIAL OVERGROWTH TEST N/A 07/13/2015   Procedure: BACTERIAL OVERGROWTH TEST;  Surgeon: Rogene Houston, MD;  [facility];  Service: Endoscopy;  Laterality: N/A;  730    . BILATERAL SALPINGOOPHORECTOMY  02/10/2001  . BREAST REDUCTION SURGERY  1994   bilat  . CARDIOVASCULAR STRESS TEST  07/2010   Lexiscan myoview: normal  . CARPAL TUNNEL RELEASE Right 1996  . CARPAL TUNNEL RELEASE Left 03/21/2003  . CARPAL TUNNEL RELEASE Right 05/04/2013   Procedure: CARPAL TUNNEL RELEASE;  Surgeon: Cammie Sickle., MD;  Location: [place];  Service: Orthopedics;  Laterality: Right;  . CARPAL TUNNEL RELEASE Left 09/21/2013   Procedure: LEFT CARPAL TUNNEL RELEASE;  Surgeon: Cammie Sickle., MD;  Location: [place];  Service: Orthopedics;  Laterality: Left;  . CHOLECYSTECTOMY    . COLONOSCOPY WITH PROPOFOL N/A 08/04/2015   Colitis in remission.  No polyps.  Procedure: COLONOSCOPY WITH PROPOFOL;  Surgeon: Rogene Houston, MD;  [facility];  Service: Endoscopy;  Laterality: N/A;  cecum time in  0820   time out  0827    total time 7 minutes  . ESOPHAGEAL DILATION N/A  08/04/2015   Procedure: ESOPHAGEAL DILATION;  Surgeon: Rogene Houston, MD;  [facility];  Service: Endoscopy;  Laterality: N/A;  [place] 57, no mucousal disruption  . ESOPHAGOGASTRODUODENOSCOPY (EGD) WITH ESOPHAGEAL DILATION  12/02/2005  . ESOPHAGOGASTRODUODENOSCOPY (EGD) WITH PROPOFOL N/A 08/04/2015   Procedure: ESOPHAGOGASTRODUODENOSCOPY (EGD) WITH PROPOFOL;  Surgeon: Rogene Houston, MD;  [facility];  Service: Endoscopy;  Laterality: N/A;  procedure 1  . FLEXIBLE SIGMOIDOSCOPY  01/17/2012   Procedure: FLEXIBLE SIGMOIDOSCOPY;  Surgeon: Rogene Houston, MD;  [facility];  Service: Endoscopy;  Laterality: N/A;  . HEMILAMINOTOMY LUMBAR SPINE Bilateral 09/07/1999   L4-5  . KNEE ARTHROSCOPY Right 01/1999;  10/2000  . LYSIS OF ADHESION  02/10/2001  . [place]; 09/12/2006  . TARSAL TUNNEL RELEASE  2002  . TRANSTHORACIC ECHOCARDIOGRAM  08/04/2015   EF 60-65%, normal wall motion, mild LVH, mild LA dilation, grd I DD.  Marland Kitchen TUMOR EXCISION Left 03/21/2003   dorsal 1st web space (hand)  . URETEROLYSIS Right 02/10/2001    Outpatient Medications Prior to Visit  Medication Sig Dispense Refill  . citalopram (CELEXA) 20 MG tablet Take 1 tablet (20 mg total) by mouth daily. 90 tablet 1  . Dulaglutide (TRULICITY) 1.5 LD/3.5TS SOPN Inject under skin 1.5 mg weekly under skin. 12 pen 1  . esomeprazole (NEXIUM) 20 MG capsule Take 20 mg by mouth daily at 12 noon.    . furosemide (LASIX) 40 MG tablet Take 1 tablet (40 mg total) by mouth daily. 90 tablet 2  . gabapentin (NEURONTIN) 300 MG capsule 1cap po bid (to be combined with a 600 mg gabapentin at each dosing) 180 capsule 3  . gabapentin (NEURONTIN) 600 MG tablet Take 600 mg by mouth 2 (two) times daily.  6  . glucose blood (ONE TOUCH ULTRA TEST) test strip Use as instructed to check sugar 4 times daily 400 each 5  . glucose blood test strip Use as instructed to test blood sugar four times per day. DX E11.09 (Patient taking differently: Use as instructed to test blood sugar 6 times per day. DX E11.09) 500 each 1  . HUMULIN R U-500 KWIKPEN 500 UNIT/ML kwikpen INJECT 80-120 UNITS 3X A DAY 30 pen 3  . inFLIXimab (REMICADE) 100 MG injection Inject into the vein every 8 (eight) weeks.     . Insulin Pen Needle 31G X 5 MM MISC Use to inject insulins equal to 6 times daily. 600 each 5  . lactase (LACTAID) 3000 UNITS tablet Take 3,000 Units by mouth as needed (when eating foods containing dairy).     Marland Kitchen loperamide (IMODIUM) 2 MG capsule Take 2 mg by mouth 3 (three) times daily as needed for diarrhea or loose stools.     Marland Kitchen LORazepam (ATIVAN) 0.5 MG tablet 1-2 tabs po bid prn moderate to severe anxiety 60 tablet 1  . meclizine (ANTIVERT) 25 MG tablet Take 1  tablet (25 mg total) by mouth 3 (three) times daily as needed for dizziness or nausea. 90 tablet 0  . metoprolol succinate (TOPROL-XL) 100 MG 24 hr tablet TAKE 1 TABLET BY MOUTH 2 TIMES DAILY. TAKE WITH OR IMMEDIATELY FOLLOWING A MEAL 180 tablet 3  . ONETOUCH DELICA LANCETS 17B MISC PATIENT USES ONE TOUCH DELICAL 93J LANCETS. Use as directed to check blood sugar up to 6 times per day. DX: E11.09 500 each 3  . potassium chloride (KLOR-CON M10) 10 MEQ tablet Take 2 tablets (20 mEq total) by mouth daily. 180 tablet  3  . vitamin B-12 (CYANOCOBALAMIN) 1000 MCG tablet Take 1,000 mcg by mouth daily.    Alveda Reasons 20 MG TABS tablet TAKE 1 TABLET (20 MG TOTAL) BY MOUTH DAILY WITH SUPPER. 90 tablet 1   No facility-administered medications prior to visit.     Allergies  Allergen Reactions  . Actos [Pioglitazone] Other (See Comments)    Weight gain  . Benzocaine-Menthol Swelling    SWELLING OF MOUTH  . Colesevelam Other (See Comments)    GI UPSET  . Flagyl [Metronidazole Hcl] Other (See Comments)    DIAPHORESIS  . Metformin And Related Diarrhea  . Omeprazole Swelling    SWELLING OF TONGUE AND THROAT  . Shrimp [Shellfish Allergy] Itching    OF THROAT AND EARS  . Statins Other (See Comments)    HEART RACING  . Desipramine Hcl Itching, Nausea Only and Other (See Comments)    "swimmy" headed, ears itched   . Hydromorphone Itching  . Jardiance [Empagliflozin] Other (See Comments)    weakness  . Adhesive [Tape] Other (See Comments)    SKIN IRRITATION AND BRUISING  . Nisoldipine Itching  . Percocet [Oxycodone-Acetaminophen] Itching    ROS As per HPI  PE: Blood pressure 135/77, pulse 76, temperature 97.6 F (36.4 C), temperature source Oral, weight 216 lb (98 kg), SpO2 95 %. Gen: Alert, well appearing.  Patient is oriented to person, place, time, and situation. AFFECT: pleasant, lucid thought and speech. Right arm with ecchymoses spread extensively from mid humerus level down to wrist.   There is a firm, warm focus of subQ swelling that is 8 cm cranio-caudal by 10 cm horizontally----located over proximal aspect of forearm extensors.  No erythema.  Mild TTP to R forearm diffusely.  Skin tension is not excessive.  Radial/ulnar pulses palpable.  Hand is pink.  Elbow and wrist ROM intact.  LABS:  Lab Results  Component Value Date   WBC 5.7 11/13/2017   HGB 12.6 11/13/2017   HCT 37.7 11/13/2017   MCV 92.2 11/13/2017   PLT 167.0 11/13/2017     Chemistry      Component Value Date/Time   NA 138 09/26/2017 1336   K 4.6 09/26/2017 1336   CL 101 09/26/2017 1336   CO2 32 09/26/2017 1336   BUN 13 09/26/2017 1336   CREATININE 0.70 09/26/2017 1336   CREATININE 0.95 11/03/2015 1625      Component Value Date/Time   CALCIUM 9.1 09/26/2017 1336   ALKPHOS 78 09/26/2017 1336   AST 88 (H) 09/26/2017 1336   ALT 68 (H) 09/26/2017 1336   BILITOT 0.6 09/26/2017 1336      IMPRESSION AND PLAN:  Right forearm contusion/hematoma; pt on oral anticoagulant. Check CBC. Check x-ray of right forearm. Apply ice 20 min bid. Signs/symptoms to call or return for were reviewed and pt expressed understanding.  An After Visit Summary was printed and given to the patient.  FOLLOW UP: Return in about 2 weeks (around 11/27/2017) for f/u R forearm hematoma.  Signed:  Crissie Sickles, MD           11/19/2017

## 2017-11-27 ENCOUNTER — Other Ambulatory Visit (INDEPENDENT_AMBULATORY_CARE_PROVIDER_SITE_OTHER): Payer: Self-pay | Admitting: *Deleted

## 2017-11-27 ENCOUNTER — Encounter (INDEPENDENT_AMBULATORY_CARE_PROVIDER_SITE_OTHER): Payer: Self-pay | Admitting: *Deleted

## 2017-11-27 DIAGNOSIS — K5190 Ulcerative colitis, unspecified, without complications: Secondary | ICD-10-CM

## 2017-11-30 ENCOUNTER — Other Ambulatory Visit: Payer: Self-pay | Admitting: Internal Medicine

## 2017-12-01 ENCOUNTER — Ambulatory Visit: Payer: Medicare Other | Admitting: Family Medicine

## 2017-12-01 ENCOUNTER — Encounter: Payer: Self-pay | Admitting: Family Medicine

## 2017-12-01 VITALS — BP 121/76 | HR 65 | Temp 98.1°F | Resp 16 | Ht 61.0 in | Wt 217.5 lb

## 2017-12-01 DIAGNOSIS — S40021D Contusion of right upper arm, subsequent encounter: Secondary | ICD-10-CM | POA: Diagnosis not present

## 2017-12-01 DIAGNOSIS — E7800 Pure hypercholesterolemia, unspecified: Secondary | ICD-10-CM

## 2017-12-01 LAB — LIPID PANEL
CHOLESTEROL: 224 mg/dL — AB (ref 0–200)
HDL: 38.6 mg/dL — AB (ref >39.00)
LDL Cholesterol: 156 mg/dL — ABNORMAL HIGH (ref 0–99)
NONHDL: 185.76
Total CHOL/HDL Ratio: 6
Triglycerides: 149 mg/dL (ref 0.0–149.0)
VLDL: 29.8 mg/dL (ref 0.0–40.0)

## 2017-12-01 NOTE — Progress Notes (Signed)
OFFICE VISIT  12/01/2017   CC:  Chief Complaint  Patient presents with  . Follow-up    hematoma on right arm   HPI:    Patient is a 72 y.o. Caucasian female who presents for f/u R arm hematoma and hypercholesterolemia. At last lab check 09/26/18, HbA1c improved to 7.1% (goal is <7%).  Instructions: Increase each dose of insulin at mealtime by 2 Units and continue with this dosing until I see her again for f/u in 1 month to review glucose numbers again.  R forearm hematoma improving/shrinking.  Itches, hurts only when she bumps it on something. Moving arm fine.  No erythema/warmth.  She is fasting today and inquired about checking cholesterol level. She has not tolerated statin medication in the past.  Past Medical History:  Diagnosis Date  . Anxiety   . 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 Fibromyalgia   . GERD   . Hepatic steatosis    Viral Hep screens NEG.  CT 2015.  Transaminasemia.  Marland Kitchen History of thrombocytopenia 12/2011  . Hyperlipidemia    Intolerant of statins  . HYPERTENSION   . IDDM (insulin dependent diabetes mellitus) ([place])    with DPN (managed by Dr. Cruzita Lederer but then in 2018 pt preferred to have me manage for her convenience)  . Limited mobility    Requires a walker for arthritic pain, widespread musculoskeletal pain, and neuropathic pain.  . Morbid obesity ([place])   . OSA (obstructive sleep apnea) 09/14/2015   sleep study 09/07/15: severe obstructive sleep apnea with an AHI of 72 and SaO2 low of 75%.>refer to sleep med for eval and tx options  . Osteoarthritis    hips, shoulders, knees  . PAF (paroxysmal atrial fibrillation) ([place])    One documented episode (after getting EGD 2016).  Was on amiodarone x 3 mo.  Rate control with metoprolol + anticoag with xarelto.   . Small fiber neuropathy    Due to DM.  Symmetric hands and feet tingling/numbness.  Marland Kitchen Ulcerative colitis ([place])    Remicade infusion Q 8 weeks    Past Surgical History:   Procedure Laterality Date  . ABDOMINAL HYSTERECTOMY  1980   partial  . BACTERIAL OVERGROWTH TEST N/A 07/13/2015   Procedure: BACTERIAL OVERGROWTH TEST;  Surgeon: Rogene Houston, MD;  [facility];  Service: Endoscopy;  Laterality: N/A;  730    . BILATERAL SALPINGOOPHORECTOMY  02/10/2001  . BREAST REDUCTION SURGERY  1994   bilat  . CARDIOVASCULAR STRESS TEST  07/2010   Lexiscan myoview: normal  . CARPAL TUNNEL RELEASE Right 1996  . CARPAL TUNNEL RELEASE Left 03/21/2003  . CARPAL TUNNEL RELEASE Right 05/04/2013   Procedure: CARPAL TUNNEL RELEASE;  Surgeon: Cammie Sickle., MD;  Location: [place];  Service: Orthopedics;  Laterality: Right;  . CARPAL TUNNEL RELEASE Left 09/21/2013   Procedure: LEFT CARPAL TUNNEL RELEASE;  Surgeon: Cammie Sickle., MD;  Location: [place];  Service: Orthopedics;  Laterality: Left;  . CHOLECYSTECTOMY    . COLONOSCOPY WITH PROPOFOL N/A 08/04/2015   Colitis in remission.  No polyps.  Procedure: COLONOSCOPY WITH PROPOFOL;  Surgeon: Rogene Houston, MD;  [facility];  Service: Endoscopy;  Laterality: N/A;  cecum time in  0820   time out  0827    total time 7 minutes  . ESOPHAGEAL DILATION N/A 08/04/2015   Procedure: ESOPHAGEAL DILATION;  Surgeon: Rogene Houston, MD;  [facility];  Service: Endoscopy;  Laterality: N/A;  [place] 57, no mucousal disruption  . ESOPHAGOGASTRODUODENOSCOPY (EGD) WITH ESOPHAGEAL DILATION  12/02/2005  . ESOPHAGOGASTRODUODENOSCOPY (EGD) WITH PROPOFOL N/A 08/04/2015   Procedure: ESOPHAGOGASTRODUODENOSCOPY (EGD) WITH PROPOFOL;  Surgeon: Rogene Houston, MD;  [facility];  Service: Endoscopy;  Laterality: N/A;  procedure 1  . FLEXIBLE SIGMOIDOSCOPY  01/17/2012   Procedure: FLEXIBLE SIGMOIDOSCOPY;  Surgeon: Rogene Houston, MD;  [facility];  Service: Endoscopy;  Laterality: N/A;  . HEMILAMINOTOMY LUMBAR SPINE Bilateral 09/07/1999   L4-5  . KNEE ARTHROSCOPY Right 01/1999;  10/2000  . LYSIS OF ADHESION  02/10/2001  . [place]; 09/12/2006  . TARSAL TUNNEL RELEASE  2002  . TRANSTHORACIC ECHOCARDIOGRAM  08/04/2015   EF 60-65%, normal wall motion, mild LVH, mild LA dilation, grd I DD.  Marland Kitchen TUMOR EXCISION Left 03/21/2003   dorsal 1st web space (hand)  . URETEROLYSIS Right 02/10/2001    Outpatient Medications Prior to Visit  Medication Sig Dispense Refill  . citalopram (CELEXA) 20 MG tablet Take 1 tablet (20 mg total) by mouth daily. 90 tablet 1  . Dulaglutide (TRULICITY) 1.5 QB/3.4LP SOPN Inject under skin 1.5 mg weekly under skin. 12 pen 1  . esomeprazole (NEXIUM) 20 MG capsule Take 20 mg by mouth daily at 12 noon.    . furosemide (LASIX) 40 MG tablet Take 1 tablet (40 mg total) by mouth daily. 90 tablet 2  . gabapentin (NEURONTIN) 300 MG capsule 1cap po bid (to be combined with a 600 mg gabapentin at each dosing) 180 capsule 3  . gabapentin (NEURONTIN) 600 MG tablet Take 600 mg by mouth 2 (two) times daily.  6  . glucose blood (ONE TOUCH ULTRA TEST) test strip Use as instructed to check sugar 4 times daily 400 each 5  . HUMULIN R U-500 KWIKPEN 500 UNIT/ML kwikpen INJECT 80-120 UNITS 3X A DAY 30 pen 3  . inFLIXimab (REMICADE) 100 MG injection Inject into the vein every 8 (eight) weeks.     . Insulin Pen Needle 31G X 5 MM MISC Use to inject insulins equal to 6 times daily. 600 each 5  . lactase (LACTAID) 3000 UNITS tablet Take 3,000 Units by mouth as needed (when eating foods containing dairy).     Marland Kitchen loperamide (IMODIUM) 2 MG capsule Take 2 mg by mouth 3 (three) times daily as needed for diarrhea or loose stools.     Marland Kitchen LORazepam (ATIVAN) 0.5 MG tablet 1-2 tabs po bid prn moderate to severe anxiety 60 tablet 1  . meclizine (ANTIVERT) 25 MG tablet Take 1 tablet (25 mg total) by mouth 3 (three) times daily as needed for dizziness or nausea. 90 tablet 0  . metoprolol succinate (TOPROL-XL) 100 MG 24 hr tablet TAKE 1 TABLET BY MOUTH 2 TIMES DAILY. TAKE WITH OR  IMMEDIATELY FOLLOWING A MEAL 180 tablet 3  . ONETOUCH DELICA LANCETS 37T MISC PATIENT USES ONE TOUCH DELICAL 02I LANCETS. Use as directed to check blood sugar up to 6 times per day. DX: E11.09 500 each 3  . potassium chloride (KLOR-CON M10) 10 MEQ tablet Take 2 tablets (20 mEq total) by mouth daily. 180 tablet 3  . vitamin B-12 (CYANOCOBALAMIN) 1000 MCG tablet Take 1,000 mcg by mouth daily.    Alveda Reasons 20 MG TABS tablet TAKE 1 TABLET (20 MG TOTAL) BY MOUTH DAILY WITH SUPPER. 90 tablet 1  . glucose blood test strip Use as instructed to test blood sugar four times  per day. DX E11.09 (Patient not taking: Reported on 12/01/2017) 500 each 1   No facility-administered medications prior to visit.     Allergies  Allergen Reactions  . Actos [Pioglitazone] Other (See Comments)    Weight gain  . Benzocaine-Menthol Swelling    SWELLING OF MOUTH  . Colesevelam Other (See Comments)    GI UPSET  . Flagyl [Metronidazole Hcl] Other (See Comments)    DIAPHORESIS  . Metformin And Related Diarrhea  . Omeprazole Swelling    SWELLING OF TONGUE AND THROAT  . Shrimp [Shellfish Allergy] Itching    OF THROAT AND EARS  . Statins Other (See Comments)    HEART RACING  . Desipramine Hcl Itching, Nausea Only and Other (See Comments)    "swimmy" headed, ears itched   . Hydromorphone Itching  . Jardiance [Empagliflozin] Other (See Comments)    weakness  . Adhesive [Tape] Other (See Comments)    SKIN IRRITATION AND BRUISING  . Nisoldipine Itching  . Percocet [Oxycodone-Acetaminophen] Itching    ROS As per HPI  PE: Blood pressure 121/76, pulse 65, temperature 98.1 F (36.7 C), temperature source Oral, resp. rate 16, height 5' 1"  (1.549 m), weight 217 lb 8 oz (98.7 kg), SpO2 96 %. Gen: Alert, well appearing.  Patient is oriented to person, place, time, and situation. AFFECT: pleasant, lucid thought and speech. R forearm radial aspect with approx golf ball-sized hematoma that is w/out tenderness, erythema,  or warmth. It measures 4.5 cm cradiocaudal by 5.5 cm L to R.   LABS:  Lab Results  Component Value Date   TSH 1.01 12/18/2016   Lab Results  Component Value Date   WBC 5.7 11/13/2017   HGB 12.6 11/13/2017   HCT 37.7 11/13/2017   MCV 92.2 11/13/2017   PLT 167.0 11/13/2017   Lab Results  Component Value Date   CREATININE 0.70 09/26/2017   BUN 13 09/26/2017   NA 138 09/26/2017   K 4.6 09/26/2017   CL 101 09/26/2017   CO2 32 09/26/2017   Lab Results  Component Value Date   ALT 68 (H) 09/26/2017   AST 88 (H) 09/26/2017   ALKPHOS 78 09/26/2017   BILITOT 0.6 09/26/2017   Lab Results  Component Value Date   CHOL 230 (H) 08/29/2015   Lab Results  Component Value Date   HDL 42.90 08/29/2015   Lab Results  Component Value Date   LDLCALC 153 (H) 08/29/2015   Lab Results  Component Value Date   TRIG 175.0 (H) 08/29/2015   Lab Results  Component Value Date   CHOLHDL 5 08/29/2015   Lab Results  Component Value Date   HGBA1C 7.1 (H) 09/26/2017    IMPRESSION AND PLAN:  1) R arm hematoma: x-ray showed no fracture. Size has diminished by 50% since initial visit 2 wks ago. She continues on xarelto.  Continue to monitor--it will eventually totally resolve.  2) Hypercholesterolemia: intol of statins. I will recheck lipid panel today--fasting. We discussed possibly getting cardiology to see if she would qualify for a PCS K9 inhibitor, but she basically said today that "I would rather not".    3) DM 2: A1c not due for check again until about 12/25/17.  An After Visit Summary was printed and given to the patient.  FOLLOW UP: Return for keep appt scheduled for 12/25/17.  Signed:  Crissie Sickles, MD           12/01/2017

## 2017-12-01 NOTE — Telephone Encounter (Signed)
No, she was lost for f/u

## 2017-12-01 NOTE — Telephone Encounter (Signed)
Is this okay to refill? 

## 2017-12-06 ENCOUNTER — Other Ambulatory Visit: Payer: Self-pay | Admitting: Internal Medicine

## 2017-12-24 ENCOUNTER — Encounter: Payer: Self-pay | Admitting: Internal Medicine

## 2017-12-25 ENCOUNTER — Ambulatory Visit: Payer: Medicare Other | Admitting: Family Medicine

## 2017-12-25 ENCOUNTER — Encounter: Payer: Self-pay | Admitting: Family Medicine

## 2017-12-25 VITALS — BP 132/81 | HR 68 | Temp 97.9°F | Ht 61.0 in | Wt 222.6 lb

## 2017-12-25 DIAGNOSIS — F411 Generalized anxiety disorder: Secondary | ICD-10-CM

## 2017-12-25 DIAGNOSIS — E1142 Type 2 diabetes mellitus with diabetic polyneuropathy: Secondary | ICD-10-CM | POA: Diagnosis not present

## 2017-12-25 DIAGNOSIS — E1165 Type 2 diabetes mellitus with hyperglycemia: Secondary | ICD-10-CM

## 2017-12-25 DIAGNOSIS — K7581 Nonalcoholic steatohepatitis (NASH): Secondary | ICD-10-CM

## 2017-12-25 DIAGNOSIS — I10 Essential (primary) hypertension: Secondary | ICD-10-CM

## 2017-12-25 DIAGNOSIS — IMO0002 Reserved for concepts with insufficient information to code with codable children: Secondary | ICD-10-CM

## 2017-12-25 LAB — COMPREHENSIVE METABOLIC PANEL
ALT: 72 U/L — ABNORMAL HIGH (ref 0–35)
AST: 70 U/L — AB (ref 0–37)
Albumin: 3.4 g/dL — ABNORMAL LOW (ref 3.5–5.2)
Alkaline Phosphatase: 69 U/L (ref 39–117)
BUN: 14 mg/dL (ref 6–23)
CO2: 30 meq/L (ref 19–32)
Calcium: 8.4 mg/dL (ref 8.4–10.5)
Chloride: 102 mEq/L (ref 96–112)
Creatinine, Ser: 0.6 mg/dL (ref 0.40–1.20)
GFR: 104.94 mL/min (ref >60.00)
GLUCOSE: 213 mg/dL — AB (ref 70–99)
Potassium: 4.3 mEq/L (ref 3.5–5.1)
Sodium: 138 mEq/L (ref 135–145)
Total Bilirubin: 0.6 mg/dL (ref 0.2–1.2)
Total Protein: 6.9 g/dL (ref 6.0–8.3)

## 2017-12-25 LAB — MICROALBUMIN / CREATININE URINE RATIO
Creatinine,U: 37.1 mg/dL
MICROALB/CREAT RATIO: 1.9 mg/g (ref 0.0–30.0)
Microalb, Ur: <0.7 mg/dL (ref 0.0–1.9)

## 2017-12-25 LAB — HEMOGLOBIN A1C: HEMOGLOBIN A1C: 6.9 % — AB (ref 4.6–6.5)

## 2017-12-25 NOTE — Progress Notes (Signed)
OFFICE VISIT  12/25/2017   CC:  Chief Complaint  Patient presents with  . Follow-up    RCI   HPI:    Patient is a 72 y.o. Caucasian female who presents for f/u DM 2, chronic steatohepatitis, chronic anxiety.  DM 2: glucoses erratic, some normal, some upper 200s later in afternoon and hs--admits this is associated with when she eats poor diet.  Taking trulicity q week, also 40-086 U U500 with each meal.    Chronic anxiety: citalopram 58m qd and lorazepam 0.527m(1-2 bid prn).  Says she is not taking lorazepam at all. Says she is doing pretty well, just some irritability associated with some family dysfunction lately.  Chronic steatohepatitis: secondary to morbid obesity and DM 2. Has ongoing struggles with diet/exercise/wt loss.  I referred her to bariatric ([facility]) clinic--Dr. BeShary Decamput it appears she has not gone to this.   ROS: no CP, no SOB, no palpitations, no abd pains, no polyuria or polydipsia, no HAs, no dizziness, no chronic muscle aches. R forearm hematoma shrinking slowly: size today is 1.5 inches x 1.5 inches.  Past Medical History:  Diagnosis Date  . Anxiety   . 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 Fibromyalgia   . GERD   . History of thrombocytopenia 12/2011  . Hyperlipidemia    Intolerant of statins  . HYPERTENSION   . [place]) [place]   with DPN (managed by Dr. GhCruzita Ledererut then in 2018 pt preferred to have me manage for her convenience)  . Limited mobility    Requires a walker for arthritic pain, widespread musculoskeletal pain, and neuropathic pain.  . Morbid obesity ([place]  . Nonalcoholic steatohepatitis    Viral Hep screens NEG.  CT 2015.  Transaminasemia.  . Marland KitchenSA (obstructive sleep apnea) 09/14/2015   sleep study 09/07/15: severe obstructive sleep apnea with an AHI of 72 and SaO2 low of 75%.>refer to sleep med for eval and tx options  . Osteoarthritis    hips, shoulders, knees  .  PAF (paroxysmal atrial fibrillation) ([place]   One documented episode (after getting EGD 2016).  Was on amiodarone x 3 mo.  Rate control with metoprolol + anticoag with xarelto.   . Small fiber neuropathy    Due to DM.  Symmetric hands and feet tingling/numbness.  . Marland [place]lcerative colitis ([place]   Remicade infusion Q 8 weeks    Past Surgical History:  Procedure Laterality Date  . ABDOMINAL HYSTERECTOMY  1980   partial  . BACTERIAL OVERGROWTH TEST N/A 07/13/2015   Procedure: BACTERIAL OVERGROWTH TEST;  Surgeon: NaRogene HoustonMD;  [facility];  Service: Endoscopy;  Laterality: N/A;  730    . BILATERAL SALPINGOOPHORECTOMY  02/10/2001  . BREAST REDUCTION SURGERY  1994   bilat  . CARDIOVASCULAR STRESS TEST  07/2010   Lexiscan myoview: normal  . CARPAL TUNNEL RELEASE Right 1996  . CARPAL TUNNEL RELEASE Left 03/21/2003  . CARPAL TUNNEL RELEASE Right 05/04/2013   Procedure: CARPAL TUNNEL RELEASE;  Surgeon: RoCammie Sickle MD;  [place] Service: [place]ics;  Laterality: Right;  . CARPAL TUNNEL RELEASE Left 09/21/2013   Procedure: LEFT CARPAL TUNNEL RELEASE;  Surgeon: RoCammie Sickle MD;  [place] Service: [place]ics;  Laterality: Left;  . CHOLECYSTECTOMY    . COLONOSCOPY WITH PROPOFOL N/A 08/04/2015   Colitis in remission.  No polyps.  Procedure: COLONOSCOPY WITH PROPOFOL;  Surgeon: Rogene Houston, MD;  [facility];  Service: Endoscopy;  Laterality: N/A;  cecum time in  0820   time out  0827    total time 7 minutes  . ESOPHAGEAL DILATION N/A 08/04/2015   Procedure: ESOPHAGEAL DILATION;  Surgeon: Rogene Houston, MD;  [facility];  Service: Endoscopy;  Laterality: N/A;  [place] 57, no mucousal disruption  . ESOPHAGOGASTRODUODENOSCOPY (EGD) WITH ESOPHAGEAL DILATION  12/02/2005  . ESOPHAGOGASTRODUODENOSCOPY (EGD) WITH PROPOFOL N/A 08/04/2015   Procedure: ESOPHAGOGASTRODUODENOSCOPY (EGD) WITH PROPOFOL;  Surgeon:  Rogene Houston, MD;  [facility];  Service: Endoscopy;  Laterality: N/A;  procedure 1  . FLEXIBLE SIGMOIDOSCOPY  01/17/2012   Procedure: FLEXIBLE SIGMOIDOSCOPY;  Surgeon: Rogene Houston, MD;  [facility];  Service: Endoscopy;  Laterality: N/A;  . HEMILAMINOTOMY LUMBAR SPINE Bilateral 09/07/1999   L4-5  . KNEE ARTHROSCOPY Right 01/1999; 10/2000  . LYSIS OF ADHESION  02/10/2001  . [place]; 09/12/2006  . TARSAL TUNNEL RELEASE  2002  . TRANSTHORACIC ECHOCARDIOGRAM  08/04/2015   EF 60-65%, normal wall motion, mild LVH, mild LA dilation, grd I DD.  Marland Kitchen TUMOR EXCISION Left 03/21/2003   dorsal 1st web space (hand)  . URETEROLYSIS Right 02/10/2001    Outpatient Medications Prior to Visit  Medication Sig Dispense Refill  . citalopram (CELEXA) 20 MG tablet Take 1 tablet (20 mg total) by mouth daily. 90 tablet 1  . Dulaglutide (TRULICITY) 1.5 UT/6.5YY SOPN Inject under skin 1.5 mg weekly under skin. 12 pen 1  . esomeprazole (NEXIUM) 20 MG capsule Take 20 mg by mouth daily at 12 noon.    . furosemide (LASIX) 40 MG tablet Take 1 tablet (40 mg total) by mouth daily. 90 tablet 2  . gabapentin (NEURONTIN) 300 MG capsule 1cap po bid (to be combined with a 600 mg gabapentin at each dosing) 180 capsule 3  . gabapentin (NEURONTIN) 600 MG tablet Take 600 mg by mouth 2 (two) times daily.  6  . glucose blood (ONE TOUCH ULTRA TEST) test strip Use as instructed to check sugar 4 times daily 400 each 5  . HUMULIN R U-500 KWIKPEN 500 UNIT/ML kwikpen INJECT 80-120 UNITS 3X A DAY 30 pen 3  . inFLIXimab (REMICADE) 100 MG injection Inject into the vein every 8 (eight) weeks.     . Insulin Pen Needle 31G X 5 MM MISC Use to inject insulins equal to 6 times daily. 600 each 5  . lactase (LACTAID) 3000 UNITS tablet Take 3,000 Units by mouth as needed (when eating foods containing dairy).     Marland Kitchen loperamide (IMODIUM) 2 MG capsule Take 2 mg by mouth 3 (three) times daily as needed for diarrhea or loose  stools.     . meclizine (ANTIVERT) 25 MG tablet Take 1 tablet (25 mg total) by mouth 3 (three) times daily as needed for dizziness or nausea. 90 tablet 0  . metoprolol succinate (TOPROL-XL) 100 MG 24 hr tablet TAKE 1 TABLET BY MOUTH 2 TIMES DAILY. TAKE WITH OR IMMEDIATELY FOLLOWING A MEAL 180 tablet 3  . ONETOUCH DELICA LANCETS 50P MISC PATIENT USES ONE TOUCH DELICAL 54S LANCETS. Use as directed to check blood sugar up to 6 times per day. DX: E11.09 500 each 3  . potassium chloride (KLOR-CON M10) 10 MEQ tablet Take 2 tablets (20 mEq total) by mouth daily. -- Office visit needed for further refills 60 tablet 0  . vitamin B-12 (CYANOCOBALAMIN)  1000 MCG tablet Take 1,000 mcg by mouth daily.    Alveda Reasons 20 MG TABS tablet TAKE 1 TABLET (20 MG TOTAL) BY MOUTH DAILY WITH SUPPER. 90 tablet 1  . LORazepam (ATIVAN) 0.5 MG tablet 1-2 tabs po bid prn moderate to severe anxiety (Patient not taking: Reported on 12/25/2017) 60 tablet 1   No facility-administered medications prior to visit.     Allergies  Allergen Reactions  . Actos [Pioglitazone] Other (See Comments)    Weight gain  . Benzocaine-Menthol Swelling    SWELLING OF MOUTH  . Colesevelam Other (See Comments)    GI UPSET  . Flagyl [Metronidazole Hcl] Other (See Comments)    DIAPHORESIS  . Metformin And Related Diarrhea  . Omeprazole Swelling    SWELLING OF TONGUE AND THROAT  . Shrimp [Shellfish Allergy] Itching    OF THROAT AND EARS  . Statins Other (See Comments)    HEART RACING  . Desipramine Hcl Itching, Nausea Only and Other (See Comments)    "swimmy" headed, ears itched   . Hydromorphone Itching  . Jardiance [Empagliflozin] Other (See Comments)    weakness  . Adhesive [Tape] Other (See Comments)    SKIN IRRITATION AND BRUISING  . Nisoldipine Itching  . Percocet [Oxycodone-Acetaminophen] Itching    ROS As per HPI  PE: Blood pressure 132/81, pulse 68, temperature 97.9 F (36.6 C), temperature source Oral, height 5' 1"   (1.549 m), weight 222 lb 9.6 oz (101 kg), SpO2 96 %. Gen: Alert, well appearing.  Patient is oriented to person, place, time, and situation. AFFECT: pleasant, lucid thought and speech. Right forearm lateral aspect with focal, round, subQ swelling c/w her known hematoma: size today is 1.5 inches diameter.  No erythema or tenderness.  It feels firm.  LABS:  Lab Results  Component Value Date   TSH 1.01 12/18/2016   Lab Results  Component Value Date   WBC 5.7 11/13/2017   HGB 12.6 11/13/2017   HCT 37.7 11/13/2017   MCV 92.2 11/13/2017   PLT 167.0 11/13/2017   Lab Results  Component Value Date   CREATININE 0.70 09/26/2017   BUN 13 09/26/2017   NA 138 09/26/2017   K 4.6 09/26/2017   CL 101 09/26/2017   CO2 32 09/26/2017   Lab Results  Component Value Date   ALT 68 (H) 09/26/2017   AST 88 (H) 09/26/2017   ALKPHOS 78 09/26/2017   BILITOT 0.6 09/26/2017   Lab Results  Component Value Date   CHOL 224 (H) 12/01/2017   Lab Results  Component Value Date   HDL 38.60 (L) 12/01/2017   Lab Results  Component Value Date   LDLCALC 156 (H) 12/01/2017   Lab Results  Component Value Date   TRIG 149.0 12/01/2017   Lab Results  Component Value Date   CHOLHDL 6 12/01/2017   Lab Results  Component Value Date   HGBA1C 7.1 (H) 09/26/2017    IMPRESSION AND PLAN:  1) DM 2: control pretty erratic for the most part, as per her usual. He fasting gluc's seem to be the most consistent and better controlled. Will see what A1c shows and make insulin adjustments as appropriate. Urine microalb/cr today. Lytes/cr today.  2) NASH: encouraged pt to increase efforts at consistent dietary improvements, increase exercise, shoot for goal of 1 lb of wt loss per week.  Will follow hepatic panel today.  3) Chronic anxiety: she is doing fairly well on citalopram 28m qd.  She does not require any lorazepam  at this time.  An After Visit Summary was printed and given to the patient.  FOLLOW UP:  Return in about 3 months (around 03/27/2018) for routine chronic illness f/u.  Signed:  Crissie Sickles, MD           12/25/2017

## 2017-12-26 ENCOUNTER — Other Ambulatory Visit: Payer: Self-pay | Admitting: Cardiology

## 2018-01-06 ENCOUNTER — Other Ambulatory Visit: Payer: Self-pay

## 2018-01-06 ENCOUNTER — Other Ambulatory Visit: Payer: Self-pay | Admitting: Internal Medicine

## 2018-01-06 MED ORDER — POTASSIUM CHLORIDE CRYS ER 10 MEQ PO TBCR: 20.0000 meq | EXTENDED_RELEASE_TABLET | Freq: Every day | ORAL | 1 refills | Status: DC

## 2018-01-09 ENCOUNTER — Other Ambulatory Visit: Payer: Self-pay | Admitting: Family Medicine

## 2018-01-09 NOTE — Telephone Encounter (Signed)
No, she was lost for f/u.

## 2018-01-09 NOTE — Telephone Encounter (Signed)
Last ov 10/28/16 1 cancelled and no future appointment scheduled- refill or refuse please advise

## 2018-01-09 NOTE — Telephone Encounter (Signed)
[facility] called and spoke to [name], [name] who verified Jennifer trulicity was denied refill by Dr. Benjiman Core. [name] was called and informed about Jennifer denied refill and Jennifer reason that she will need an appointment with [name]. [name] says she no longer sees her and she is now being seen by [name] who follows her diabetes and asked for Jennifer prescriptions to be sent to him for a 90 day refill. I advised this would be sent and it would be seen on Monday, she says that is fine, she is not out.  Last OV: 12/25/17 PCP: [name]: [id number] [phone](Phone) [phone](Fax)

## 2018-01-09 NOTE — Telephone Encounter (Signed)
Copied from [place] [id number]. Topic: Quick Communication - Rx Refill/Question >> Jan 09, 2018  3:12 PM Neva Seat wrote: Dulaglutide (TRULICITY) 1.5 MM/4.0AR [facility]  Pharmacy is saying the refills were denied.  Pt needing refills Pt also wants to check to see if the lancets and test strips have refills as well.   [id number] [id number]: [phone]: [phone]

## 2018-01-12 MED ORDER — ONETOUCH DELICA LANCETS 33G MISC: 3 refills | Status: DC

## 2018-01-12 MED ORDER — DULAGLUTIDE 1.5 MG/0.5ML ~~LOC~~ SOAJ: SUBCUTANEOUS | 1 refills | Status: DC

## 2018-01-12 MED ORDER — GLUCOSE BLOOD VI STRP: ORAL_STRIP | 5 refills | Status: DC

## 2018-01-16 ENCOUNTER — Other Ambulatory Visit: Payer: Self-pay | Admitting: Internal Medicine

## 2018-01-20 ENCOUNTER — Ambulatory Visit (INDEPENDENT_AMBULATORY_CARE_PROVIDER_SITE_OTHER): Payer: Medicare Other | Admitting: Internal Medicine

## 2018-01-20 ENCOUNTER — Encounter (INDEPENDENT_AMBULATORY_CARE_PROVIDER_SITE_OTHER): Payer: Self-pay | Admitting: Internal Medicine

## 2018-01-20 VITALS — BP 110/74 | HR 72 | Temp 98.0°F | Resp 18 | Ht 61.0 in | Wt 220.8 lb

## 2018-01-20 DIAGNOSIS — K5190 Ulcerative colitis, unspecified, without complications: Secondary | ICD-10-CM | POA: Diagnosis not present

## 2018-01-20 DIAGNOSIS — K760 Fatty (change of) liver, not elsewhere classified: Secondary | ICD-10-CM

## 2018-01-20 DIAGNOSIS — K580 Irritable bowel syndrome with diarrhea: Secondary | ICD-10-CM | POA: Diagnosis not present

## 2018-01-20 MED ORDER — DICYCLOMINE HCL 10 MG PO CAPS: 10.0000 mg | ORAL_CAPSULE | Freq: Three times a day (TID) | ORAL | 2 refills | Status: DC

## 2018-01-20 NOTE — Patient Instructions (Signed)
Take Imodium OTC loperamide 2 mg every morning and second dose on as-needed basis. Keep stool diary for the next 3 to 4 weeks and call office with progress report. If diarrhea persists will proceed with stool studies prior to considering diagnostic colonoscopy.

## 2018-01-20 NOTE — Progress Notes (Signed)
Presenting complaint;  Follow-up for ulcerative colitis. Patient complains of diarrhea.  Database and subjective:  Patient is 72 year old Caucasian female with history of ulcerative her last colonoscopy was in colitis chronic GERD and fatty liver who is here for scheduled visit.  She was noted to be in endoscopic remission on last colonoscopy of November 2016. Now she presented with diarrhea.  She states she has had diarrhea for about a year but she decided not to discuss this symptom on her last visit 6 months ago.  She has at least 3-4 stools per day.  Almost all of her bowel movements occur soon after or while she is eating her meals.  She has urgency and afraid that she may have an accident.  She has had occasional nocturnal bowel movement.  She denies melena rectal bleeding or abdominal pain.  She is using Imodium OTC on as-needed basis but it does not help much. She remains with very good appetite.  Her weight has been stable.  She is not having any side effects with infliximab.  She states heartburn is reasonably well controlled with current dose of Nexium OTC but she had better results with higher dose.  She denies dysphagia.   Current Medications: Outpatient Encounter Medications as of 01/20/2018  Medication Sig  . citalopram (CELEXA) 20 MG tablet Take 1 tablet (20 mg total) by mouth daily.  . Dulaglutide (TRULICITY) 1.5 UV/2.5DG SOPN Inject under skin 1.5 mg weekly under skin.  Marland Kitchen esomeprazole (NEXIUM) 20 MG capsule Take 20 mg by mouth daily at 12 noon.  . furosemide (LASIX) 40 MG tablet Take 1 tablet (40 mg total) by mouth daily.  Marland Kitchen gabapentin (NEURONTIN) 300 MG capsule 1cap po bid (to be combined with a 600 mg gabapentin at each dosing) (Patient taking differently: Take 300 mg by mouth 2 (two) times daily. 1cap po bid (to be combined with a 600 mg gabapentin at each dosing))  . gabapentin (NEURONTIN) 600 MG tablet Take 600 mg by mouth 2 (two) times daily.  Marland Kitchen glucose blood (ONE TOUCH  ULTRA TEST) test strip Use as instructed to check sugar 6 times daily  . HUMULIN R U-500 KWIKPEN 500 UNIT/ML kwikpen INJECT 80-120 UNITS 3X A DAY  . inFLIXimab (REMICADE) 100 MG injection Inject into the vein every 8 (eight) weeks.   . Insulin Pen Needle 31G X 5 MM MISC Use to inject insulins equal to 6 times daily.  Marland Kitchen lactase (LACTAID) 3000 UNITS tablet Take 3,000 Units by mouth as needed (when eating foods containing dairy).   Marland Kitchen loperamide (IMODIUM) 2 MG capsule Take 2 mg by mouth 3 (three) times daily as needed for diarrhea or loose stools.   . meclizine (ANTIVERT) 25 MG tablet Take 1 tablet (25 mg total) by mouth 3 (three) times daily as needed for dizziness or nausea.  . metoprolol succinate (TOPROL-XL) 100 MG 24 hr tablet TAKE 1 TABLET BY MOUTH 2 TIMES DAILY. TAKE WITH OR IMMEDIATELY FOLLOWING A MEAL  . ONETOUCH DELICA LANCETS 64Q MISC PATIENT USES ONE TOUCH DELICAL 03K LANCETS. Use as directed to check blood sugar up to 6 times per day. DX: E11.09  . potassium chloride (KLOR-CON M10) 10 MEQ tablet Take 2 tablets (20 mEq total) by mouth daily. -- Office visit needed for further refills  . vitamin B-12 (CYANOCOBALAMIN) 1000 MCG tablet Take 1,000 mcg by mouth daily.  Alveda Reasons 20 MG TABS tablet TAKE 1 TABLET (20 MG TOTAL) BY MOUTH DAILY WITH SUPPER.  . [DISCONTINUED] amitriptyline (ELAVIL)  25 MG tablet Take 1 tablet (25 mg total) by mouth at bedtime. start with 1/2 tab at night for 1 week then increase to 1 whole tab.  . [DISCONTINUED] bromocriptine (PARLODEL) 2.5 MG tablet Take 2.5 mg by mouth 2 (two) times daily.    . [DISCONTINUED] LORazepam (ATIVAN) 0.5 MG tablet 1-2 tabs po bid prn moderate to severe anxiety (Patient not taking: Reported on 12/25/2017)  . [DISCONTINUED] potassium chloride (K-DUR) 10 MEQ tablet Take 2 tablets (20 mEq total) by mouth daily.   No facility-administered encounter medications on file as of 01/20/2018.      Objective: Blood pressure 110/74, pulse 72,  temperature 98 F (36.7 C), temperature source Oral, resp. rate 18, height 5' 1"  (1.549 m), weight 220 lb 12.8 oz (100.2 kg). Patient is alert and in no acute distress. Conjunctiva is pink. Sclera is nonicteric Oropharyngeal mucosa is normal. No neck masses or thyromegaly noted. Cardiac exam with regular rhythm normal S1 and S2. No murmur or gallop noted. Lungs are clear to auscultation. Abdomen is full.  Bowel sounds are normal.  On palpation abdomen is soft and nontender without organomegaly or masses. She has mild nonpitting pretibial edema.  Labs/studies Results:  Lab data from 12/25/2017 Glucose 213 BUN 14 and creatinine 0.60 Serum sodium 138 potassium 4.3, chloride 102 and CO2 30. Serum calcium 8.4. Bilirubin 0.6, AP 69, AST 70, ALT 72 and albumin 3.4. Hemoglobin A1c 6.9.  Lab data from 11/13/2017. WBC 5.7, H&H 12.6 and 37.7 and platelet count 167K.   Assessment:  #1.  Chronic ulcerative colitis.  She appears to be in remission.  She remains on infliximab every 8 weeks.  Diarrhea does not appear to be due to IBD.  #2.  Chronic nonbloody diarrhea most likely due to IBS.  I do not believe that she has a flareup of ulcerative colitis as I would expect her to bleed given that she is anticoagulated.  If she does not respond to symptomatic therapy would consider stool studies and colonoscopy.  #3.  Chronic GERD.  Symptom control fair with OTC double dose PPI.  #4.  Elevated transaminases secondary to fatty liver.  Degree of elevation mild and trend 1 of improvement.   Plan:  Patient advised to take loperamide OTC 2 mg every morning followed by a second dose on as-needed basis. Dicyclomine 10 mg by mouth 30 minutes before each meal. Patient will keep stool diary and call with progress report in 3 to 4 weeks.  If diarrhea persists we will proceed with further work-up. Office visit in 6 months.

## 2018-01-23 ENCOUNTER — Other Ambulatory Visit: Payer: Self-pay | Admitting: Family Medicine

## 2018-01-23 MED ORDER — GABAPENTIN 300 MG PO CAPS
ORAL_CAPSULE | ORAL | 3 refills | Status: DC
Start: 2018-01-23 — End: 2018-08-25

## 2018-01-23 NOTE — Telephone Encounter (Signed)
OK, new rx for gabapentin sent in. I'll deny the RF of the old one.

## 2018-01-23 NOTE — Telephone Encounter (Signed)
Recent dose change, requesting new Rx for new dose and sig. Please advise. Thanks.

## 2018-01-27 ENCOUNTER — Encounter

## 2018-01-27 ENCOUNTER — Ambulatory Visit (INDEPENDENT_AMBULATORY_CARE_PROVIDER_SITE_OTHER): Payer: Medicare Other | Admitting: Obstetrics & Gynecology

## 2018-01-27 ENCOUNTER — Other Ambulatory Visit: Payer: Self-pay

## 2018-01-27 ENCOUNTER — Encounter: Payer: Self-pay | Admitting: Obstetrics & Gynecology

## 2018-01-27 VITALS — BP 144/70 | HR 76 | Resp 16 | Ht 61.0 in | Wt 219.2 lb

## 2018-01-27 DIAGNOSIS — K649 Unspecified hemorrhoids: Secondary | ICD-10-CM | POA: Diagnosis not present

## 2018-01-27 DIAGNOSIS — Z01419 Encounter for gynecological examination (general) (routine) without abnormal findings: Secondary | ICD-10-CM

## 2018-01-27 NOTE — Progress Notes (Signed)
72 y.o. G1P1 MarriedCaucasianF here for annual exam.  Doing well.  UC is under good control and diarrhea is under good control.  Really please with medication regimen at this time.  Does have follow up in 4 weeks planned.    Reports issues with skin where rubbing occurs.  Using vaseline with some improvement.  Frustrated with discomfort.  Denies vaginal bleeding.  Last HbA1C 6.9 on 12/25/17.  No LMP recorded. Patient has had a hysterectomy.          Sexually active: No.  The current method of family planning is status post hysterectomy.    Exercising: Yes.    recomb bike Smoker:  no  Health Maintenance: Pap:  2012 Normal History of abnormal Pap:  no MMG:  09/17/17 BIRADS1:neg  Colonoscopy:  2016 Dr. Laural [name]. F/u 5 years  BMD:   06/23/09.  Was to do one with her last mammogram.  Reports she forgot to have this done.   TDaP:  2010 Pneumonia vaccine(s):  2018 Shingrix:  Zostavax 2013 Hep C testing: 10/25/16 Neg  Screening Labs: PCP   reports that she has never smoked. She has never used smokeless tobacco. She reports that she drinks alcohol. She reports that she does not use drugs.  Past Medical History:  Diagnosis Date  . Anxiety   . 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 Fibromyalgia   . GERD   . History of thrombocytopenia 12/2011  . Hyperlipidemia    Intolerant of statins  . HYPERTENSION   . IDDM (insulin dependent diabetes mellitus) ([place])    with DPN (managed by Dr. Cruzita Lederer but then in 2018 pt preferred to have me manage for her convenience)  . Limited mobility    Requires a walker for arthritic pain, widespread musculoskeletal pain, and neuropathic pain.  . Morbid obesity ([place])   . Nonalcoholic steatohepatitis    Viral Hep screens NEG.  CT 2015.  Transaminasemia.  Marland Kitchen OSA (obstructive sleep apnea) 09/14/2015   sleep study 09/07/15: severe obstructive sleep apnea with an AHI of 72 and SaO2 low of 75%.>refer to sleep med for eval and tx options  .  Osteoarthritis    hips, shoulders, knees  . PAF (paroxysmal atrial fibrillation) ([place])    One documented episode (after getting EGD 2016).  Was on amiodarone x 3 mo.  Rate control with metoprolol + anticoag with xarelto.   . Small fiber neuropathy    Due to DM.  Symmetric hands and feet tingling/numbness.  Marland Kitchen Ulcerative colitis ([place])    Remicade infusion Q 8 weeks    Past Surgical History:  Procedure Laterality Date  . ABDOMINAL HYSTERECTOMY  1980   partial  . BACTERIAL OVERGROWTH TEST N/A 07/13/2015   Procedure: BACTERIAL OVERGROWTH TEST;  Surgeon: Rogene Houston, MD;  [facility];  Service: Endoscopy;  Laterality: N/A;  730    . BILATERAL SALPINGOOPHORECTOMY  02/10/2001  . BREAST REDUCTION SURGERY  1994   bilat  . CARDIOVASCULAR STRESS TEST  07/2010   Lexiscan myoview: normal  . CARPAL TUNNEL RELEASE Right 1996  . CARPAL TUNNEL RELEASE Left 03/21/2003  . CARPAL TUNNEL RELEASE Right 05/04/2013   Procedure: CARPAL TUNNEL RELEASE;  Surgeon: Cammie Sickle., MD;  Location: [facility];  Service: Orthopedics;  Laterality: Right;  . CARPAL TUNNEL RELEASE Left 09/21/2013   Procedure: LEFT CARPAL TUNNEL RELEASE;  Surgeon: Cammie Sickle., MD;  Location: [facility];  Service: Orthopedics;  Laterality: Left;  . CHOLECYSTECTOMY    . COLONOSCOPY WITH PROPOFOL N/A 08/04/2015   Colitis in remission.  No polyps.  Procedure: COLONOSCOPY WITH PROPOFOL;  Surgeon: Rogene Houston, MD;  Location: [facility];  Service: Endoscopy;  Laterality: N/A;  cecum time in  0820   time out  0827    total time 7 minutes  . ESOPHAGEAL DILATION N/A 08/04/2015   Procedure: ESOPHAGEAL DILATION;  Surgeon: Rogene Houston, MD;  Location: [facility];  Service: Endoscopy;  Laterality: N/A;  [place] 57, no mucousal disruption  . ESOPHAGOGASTRODUODENOSCOPY (EGD) WITH ESOPHAGEAL DILATION  12/02/2005  . ESOPHAGOGASTRODUODENOSCOPY (EGD) WITH PROPOFOL N/A 08/04/2015   Procedure:  ESOPHAGOGASTRODUODENOSCOPY (EGD) WITH PROPOFOL;  Surgeon: Rogene Houston, MD;  Location: [facility];  Service: Endoscopy;  Laterality: N/A;  procedure 1  . FLEXIBLE SIGMOIDOSCOPY  01/17/2012   Procedure: FLEXIBLE SIGMOIDOSCOPY;  Surgeon: Rogene Houston, MD;  [facility];  Service: Endoscopy;  Laterality: N/A;  . HEMILAMINOTOMY LUMBAR SPINE Bilateral 09/07/1999   L4-5  . KNEE ARTHROSCOPY Right 01/1999; 10/2000  . LYSIS OF ADHESION  02/10/2001  . [place]; 09/12/2006  . TARSAL TUNNEL RELEASE  2002  . TRANSTHORACIC ECHOCARDIOGRAM  08/04/2015   EF 60-65%, normal wall motion, mild LVH, mild LA dilation, grd I DD.  Marland Kitchen TUMOR EXCISION Left 03/21/2003   dorsal 1st web space (hand)  . URETEROLYSIS Right 02/10/2001    Current Outpatient Medications  Medication Sig Dispense Refill  . citalopram (CELEXA) 20 MG tablet Take 1 tablet (20 mg total) by mouth daily. 90 tablet 1  . dicyclomine (BENTYL) 10 MG capsule Take 1 capsule (10 mg total) by mouth 3 (three) times daily before meals. 90 capsule 2  . Dulaglutide (TRULICITY) 1.5 AL/9.3XT SOPN Inject under skin 1.5 mg weekly under skin. 12 pen 1  . esomeprazole (NEXIUM) 20 MG capsule Take 20 mg by mouth daily at 12 noon.    . furosemide (LASIX) 40 MG tablet Take 1 tablet (40 mg total) by mouth daily. 90 tablet 2  . gabapentin (NEURONTIN) 300 MG capsule 3-4 caps po bid (Patient taking differently: Take 300 mg by mouth 2 (two) times daily. 3-4 caps po bid) 720 capsule 3  . gabapentin (NEURONTIN) 600 MG tablet Take 600 mg by mouth 2 (two) times daily.    Marland Kitchen glucose blood (ONE TOUCH ULTRA TEST) test strip Use as instructed to check sugar 6 times daily 400 each 5  . HUMULIN R U-500 KWIKPEN 500 UNIT/ML kwikpen INJECT 80-120 UNITS 3X A DAY 30 pen 3  . inFLIXimab (REMICADE) 100 MG injection Inject into the vein every 8 (eight) weeks.     . Insulin Pen Needle 31G X 5 MM MISC Use to inject insulins equal to 6 times daily. 600 each 5  . lactase  (LACTAID) 3000 UNITS tablet Take 3,000 Units by mouth as needed (when eating foods containing dairy).     Marland Kitchen loperamide (IMODIUM) 2 MG capsule Take 2 mg by mouth 3 (three) times daily as needed for diarrhea or loose stools.     . meclizine (ANTIVERT) 25 MG tablet Take 1 tablet (25 mg total) by mouth 3 (three) times daily as needed for dizziness or nausea. 90 tablet 0  . metoprolol succinate (TOPROL-XL) 100 MG 24 hr tablet TAKE 1 TABLET BY MOUTH 2 TIMES DAILY. TAKE WITH OR IMMEDIATELY FOLLOWING A MEAL 180 tablet 3  . ONETOUCH DELICA LANCETS 02I MISC PATIENT USES ONE TOUCH DELICAL 09B LANCETS.  Use as directed to check blood sugar up to 6 times per day. DX: E11.09 500 each 3  . potassium chloride (KLOR-CON M10) 10 MEQ tablet Take 2 tablets (20 mEq total) by mouth daily. -- Office visit needed for further refills 180 tablet 1  . vitamin B-12 (CYANOCOBALAMIN) 1000 MCG tablet Take 1,000 mcg by mouth daily.    Alveda Reasons 20 MG TABS tablet TAKE 1 TABLET (20 MG TOTAL) BY MOUTH DAILY WITH SUPPER. 90 tablet 0   No current facility-administered medications for this visit.     Family History  Problem Relation Age of Onset  . Diabetes Mother   . Hypertension Mother   . Heart attack Father        Mid 71's  . Heart disease Father   . Lung disease Father        spot on lung; had lung surgery  . Alcohol abuse Other   . Hypertension Son   . Diabetes Son   . Emphysema Maternal Grandfather   . Asthma Maternal Grandfather   . Colon cancer Paternal Grandfather   . Stomach cancer Paternal Grandmother   . Neuropathy Neg Hx     Review of Systems  HENT:       Hair loss  Gastrointestinal: Positive for diarrhea.  Musculoskeletal: Positive for myalgias.    Exam:   BP (!) 144/70 (BP Location: Right Arm, Patient Position: Sitting, Cuff Size: Large)   Pulse 76   Resp 16   Ht 5' 1"  (1.549 m)   Wt 219 lb 3.2 oz (99.4 kg)   BMI 41.42 kg/m    Height: 5' 1"  (154.9 cm)  Ht Readings from Last 3 Encounters:   01/27/18 5' 1"  (1.549 m)  01/20/18 5' 1"  (1.549 m)  12/25/17 5' 1"  (1.549 m)    General appearance: alert, cooperative and appears stated age Head: Normocephalic, without obvious abnormality, atraumatic Neck: no adenopathy, supple, symmetrical, trachea midline and thyroid normal to inspection and palpation Lungs: clear to auscultation bilaterally Breasts: normal appearance, no masses or tenderness Heart: regular rate and rhythm Abdomen: soft, non-tender; bowel sounds normal; no masses,  no organomegaly Extremities: extremities normal, atraumatic, no cyanosis or edema Skin: Skin color, texture, turgor normal. No rashes or lesions Lymph nodes: Cervical, supraclavicular, and axillary nodes normal. No abnormal inguinal nodes palpated Neurologic: Grossly normal  Pelvic: External genitalia:  no lesions              Urethra:  normal appearing urethra with no masses, tenderness or lesions              Bartholins and Skenes: normal                 Vagina: atrophic appearance of vagina with normal color and discharge, no lesions              Cervix: absent              Pap taken: No. Bimanual Exam:  Uterus:  uterus absent              Adnexa: no mass, fullness, tenderness               Rectovaginal: Confirms               Anus:  normal sphincter tone, two external skin tags and hemorrhoid at 6-7 o'clock  Chaperone was present for exam.  A:  Well Woman with normal exam PMP, no HRT H/O TAH, then later BSO Hypertension  Elevated lipids Ulcerative colitis, followed by Dr. Laural [name] Insulin requiring diabetes Hemorrhoids/skin tags Frustrations with body shape/appearance  P:   Mammogram guidelines reviewed.  Doing yearly. pap smear not indicated Desires referral to general surgery for possible skin tag/hemorrhoid removal.  Referral made to Dr. Marcello Moores. Pregnancy band to help lift panus discussed vs surgical treatment.  May want plastic surgery names Pt aware I really would like her to get a  BMD this year.  Order already placed. return annually or prn

## 2018-01-29 ENCOUNTER — Other Ambulatory Visit: Payer: Self-pay | Admitting: Internal Medicine

## 2018-02-17 ENCOUNTER — Other Ambulatory Visit: Payer: Self-pay | Admitting: Internal Medicine

## 2018-02-17 ENCOUNTER — Other Ambulatory Visit: Payer: Self-pay | Admitting: Family Medicine

## 2018-02-17 NOTE — Telephone Encounter (Signed)
RF request for gabapentin LOV: 12/25/17 Next ov: 03/27/18 Last written: unknown  Please advise. Thanks.

## 2018-02-18 ENCOUNTER — Encounter: Payer: Self-pay | Admitting: Family Medicine

## 2018-02-27 ENCOUNTER — Other Ambulatory Visit: Payer: Self-pay | Admitting: Internal Medicine

## 2018-03-03 ENCOUNTER — Other Ambulatory Visit: Payer: Self-pay | Admitting: Internal Medicine

## 2018-03-13 ENCOUNTER — Other Ambulatory Visit: Payer: Self-pay | Admitting: Internal Medicine

## 2018-03-17 ENCOUNTER — Other Ambulatory Visit: Payer: Self-pay | Admitting: Family Medicine

## 2018-03-27 ENCOUNTER — Encounter: Payer: Self-pay | Admitting: Family Medicine

## 2018-03-27 ENCOUNTER — Ambulatory Visit: Payer: Medicare Other | Admitting: Family Medicine

## 2018-03-27 VITALS — BP 110/72 | HR 63 | Temp 98.4°F | Resp 16 | Ht 61.0 in | Wt 223.0 lb

## 2018-03-27 DIAGNOSIS — E1142 Type 2 diabetes mellitus with diabetic polyneuropathy: Secondary | ICD-10-CM | POA: Diagnosis not present

## 2018-03-27 DIAGNOSIS — F329 Major depressive disorder, single episode, unspecified: Secondary | ICD-10-CM | POA: Diagnosis not present

## 2018-03-27 DIAGNOSIS — K7581 Nonalcoholic steatohepatitis (NASH): Secondary | ICD-10-CM

## 2018-03-27 DIAGNOSIS — F419 Anxiety disorder, unspecified: Secondary | ICD-10-CM | POA: Diagnosis not present

## 2018-03-27 DIAGNOSIS — Z794 Long term (current) use of insulin: Secondary | ICD-10-CM

## 2018-03-27 DIAGNOSIS — E118 Type 2 diabetes mellitus with unspecified complications: Secondary | ICD-10-CM | POA: Diagnosis not present

## 2018-03-27 DIAGNOSIS — F32A Depression, unspecified: Secondary | ICD-10-CM

## 2018-03-27 LAB — HEMOGLOBIN A1C: HEMOGLOBIN A1C: 7.4 % — AB (ref 4.6–6.5)

## 2018-03-27 LAB — COMPREHENSIVE METABOLIC PANEL
ALBUMIN: 3.7 g/dL (ref 3.5–5.2)
ALK PHOS: 85 U/L (ref 39–117)
ALT: 139 U/L — AB (ref 0–35)
AST: 172 U/L — AB (ref 0–37)
BILIRUBIN TOTAL: 0.5 mg/dL (ref 0.2–1.2)
BUN: 10 mg/dL (ref 6–23)
CALCIUM: 9 mg/dL (ref 8.4–10.5)
CO2: 28 mEq/L (ref 19–32)
CREATININE: 0.62 mg/dL (ref 0.40–1.20)
Chloride: 101 mEq/L (ref 96–112)
GFR: 100.97 mL/min (ref >60.00)
Glucose, Bld: 169 mg/dL — ABNORMAL HIGH (ref 70–99)
Potassium: 4.3 mEq/L (ref 3.5–5.1)
Sodium: 137 mEq/L (ref 135–145)
TOTAL PROTEIN: 7 g/dL (ref 6.0–8.3)

## 2018-03-27 MED ORDER — INSULIN REGULAR HUMAN (CONC) 500 UNIT/ML ~~LOC~~ SOPN: PEN_INJECTOR | SUBCUTANEOUS | 1 refills | Status: DC

## 2018-03-27 NOTE — Patient Instructions (Signed)
Increase U-500 (mealtime) insulin: see your updated med list.

## 2018-03-27 NOTE — Progress Notes (Addendum)
OFFICE VISIT  03/27/2018   CC:  Chief Complaint  Patient presents with  . Follow-up    RCI, pt is not fasting.      HPI:    Patient is a 72 y.o. Caucasian female who presents for 3 mo f/u DM 2, chronic steatohepatitis, chronic anxiety.  DM 2: Fastings 80-180 range, avg 125 or so.  Mid day and 2H PP and hs are all still mid to upper 200s. U-500 110 U qAM, 120 U q "lupper" and 110 at night.    Anxiety: still not requiring any benzo use.  Takes citalopram nightly. Doing very well with this issue.   Steatohepatitis: secondary to DM 2 and obesity.  Referred to Dr. Valentina Gu wt loss clinic. She has not gotten there yet.    ROS: one nosebleed per day this week--has chronic brittle mucosa of L nostril.  Sleeps with CPAP at night. No gross hematuria, no melena or hematochezia, no hemoptysis.   Has chronic tingling discomfort in LLs and hands --dx'd with small fiber neuropathy.  Gabapentin increase to 900 bid last visit has helped legs a lot but hands are the same.  She is chronically sleepy.  Past Medical History:  Diagnosis Date  . Anxiety   . 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 Fibromyalgia   . GERD   . History of thrombocytopenia 12/2011  . Hyperlipidemia    Intolerant of statins  . HYPERTENSION   . IBS (irritable bowel syndrome)    -D  . IDDM (insulin dependent diabetes mellitus) ([place])    with DPN (managed by Dr. Cruzita Lederer but then in 2018 pt preferred to have me manage for her convenience)  . Limited mobility    Requires a walker for arthritic pain, widespread musculoskeletal pain, and neuropathic pain.  . Morbid obesity ([place])   . Nonalcoholic steatohepatitis    Viral Hep screens NEG.  CT 2015.  Transaminasemia.  Marland Kitchen OSA (obstructive sleep apnea) 09/14/2015   sleep study 09/07/15: severe obstructive sleep apnea with an AHI of 72 and SaO2 low of 75%.>refer to sleep med for eval and tx options  . Osteoarthritis    hips, shoulders, knees  . PAF  (paroxysmal atrial fibrillation) ([place])    One documented episode (after getting EGD 2016).  Was on amiodarone x 3 mo.  Rate control with metoprolol + anticoag with xarelto.   . Small fiber neuropathy    Due to DM.  Symmetric hands and feet tingling/numbness.  Marland Kitchen Ulcerative colitis ([place])    Remicade infusion Q 8 weeks    Past Surgical History:  Procedure Laterality Date  . ABDOMINAL HYSTERECTOMY  1980   Paps no longer indicated.  Marland Kitchen BACTERIAL OVERGROWTH TEST N/A 07/13/2015   Procedure: BACTERIAL OVERGROWTH TEST;  Surgeon: Rogene Houston, MD;  [facility];  Service: Endoscopy;  Laterality: N/A;  730    . BILATERAL SALPINGOOPHORECTOMY  02/10/2001  . BREAST REDUCTION SURGERY  1994   bilat  . CARDIOVASCULAR STRESS TEST  07/2010   Lexiscan myoview: normal  . CARPAL TUNNEL RELEASE Right 1996  . CARPAL TUNNEL RELEASE Left 03/21/2003  . CARPAL TUNNEL RELEASE Right 05/04/2013   Procedure: CARPAL TUNNEL RELEASE;  Surgeon: Cammie Sickle., MD;  Location: [facility];  Service: Orthopedics;  Laterality: Right;  . CARPAL TUNNEL RELEASE Left 09/21/2013   Procedure: LEFT CARPAL TUNNEL RELEASE;  Surgeon: Cammie Sickle., MD;  Location: [place];  Service:  Orthopedics;  Laterality: Left;  . CHOLECYSTECTOMY    . COLONOSCOPY WITH PROPOFOL N/A 08/04/2015   Colitis in remission.  No polyps.  Procedure: COLONOSCOPY WITH PROPOFOL;  Surgeon: Rogene Houston, MD;  [facility];  Service: Endoscopy;  Laterality: N/A;  cecum time in  0820   time out  0827    total time 7 minutes  . ESOPHAGEAL DILATION N/A 08/04/2015   Procedure: ESOPHAGEAL DILATION;  Surgeon: Rogene Houston, MD;  [facility];  Service: Endoscopy;  Laterality: N/A;  Maloney 57, no mucousal disruption  . ESOPHAGOGASTRODUODENOSCOPY (EGD) WITH ESOPHAGEAL DILATION  12/02/2005  . ESOPHAGOGASTRODUODENOSCOPY (EGD) WITH PROPOFOL N/A 08/04/2015   Procedure: ESOPHAGOGASTRODUODENOSCOPY (EGD) WITH  PROPOFOL;  Surgeon: Rogene Houston, MD;  [facility];  Service: Endoscopy;  Laterality: N/A;  procedure 1  . FLEXIBLE SIGMOIDOSCOPY  01/17/2012   Procedure: FLEXIBLE SIGMOIDOSCOPY;  Surgeon: Rogene Houston, MD;  [facility];  Service: Endoscopy;  Laterality: N/A;  . HEMILAMINOTOMY LUMBAR SPINE Bilateral 09/07/1999   L4-5  . KNEE ARTHROSCOPY Right 01/1999; 10/2000  . LYSIS OF ADHESION  02/10/2001  . [place]; 09/12/2006  . TARSAL TUNNEL RELEASE  2002  . TRANSTHORACIC ECHOCARDIOGRAM  08/04/2015   EF 60-65%, normal wall motion, mild LVH, mild LA dilation, grd I DD.  Marland Kitchen TUMOR EXCISION Left 03/21/2003   dorsal 1st web space (hand)  . URETEROLYSIS Right 02/10/2001    Outpatient Medications Prior to Visit  Medication Sig Dispense Refill  . citalopram (CELEXA) 20 MG tablet Take 1 tablet (20 mg total) by mouth daily. 90 tablet 1  . dicyclomine (BENTYL) 10 MG capsule Take 1 capsule (10 mg total) by mouth 3 (three) times daily before meals. 90 capsule 2  . Dulaglutide (TRULICITY) 1.5 FB/5.1WC SOPN Inject under skin 1.5 mg weekly under skin. 12 pen 1  . esomeprazole (NEXIUM) 20 MG capsule Take 20 mg by mouth daily at 12 noon.    . furosemide (LASIX) 40 MG tablet Take 1 tablet (40 mg total) by mouth daily. -- Office visit needed for further refills 30 tablet 0  . gabapentin (NEURONTIN) 300 MG capsule 3-4 caps po bid (Patient taking differently: Take 300 mg by mouth 2 (two) times daily. 3-4 caps po bid) 720 capsule 3  . gabapentin (NEURONTIN) 600 MG tablet TAKE 1 TABLET BY MOUTH TWICE A DAY 180 tablet 1  . glucose blood (ONE TOUCH ULTRA TEST) test strip Use as instructed to check sugar 6 times daily 400 each 5  . HUMULIN R U-500 KWIKPEN 500 UNIT/ML kwikpen INJECT 80-120 UNITS 3 TIMES A DAY AS DIRECTED 24 pen 1  . inFLIXimab (REMICADE) 100 MG injection Inject into the vein every 8 (eight) weeks.     . Insulin Pen Needle 31G X 5 MM MISC Use to inject insulins equal to 6 times  daily. 600 each 5  . lactase (LACTAID) 3000 UNITS tablet Take 3,000 Units by mouth as needed (when eating foods containing dairy).     Marland Kitchen loperamide (IMODIUM) 2 MG capsule Take 2 mg by mouth 3 (three) times daily as needed for diarrhea or loose stools.     . meclizine (ANTIVERT) 25 MG tablet Take 1 tablet (25 mg total) by mouth 3 (three) times daily as needed for dizziness or nausea. 90 tablet 0  . metoprolol succinate (TOPROL-XL) 100 MG 24 hr tablet TAKE 1 TABLET BY MOUTH 2 TIMES DAILY. TAKE WITH OR IMMEDIATELY FOLLOWING A MEAL 180 tablet 3  .  ONETOUCH DELICA LANCETS 90Z MISC PATIENT USES ONE TOUCH DELICAL 00P LANCETS. Use as directed to check blood sugar up to 6 times per day. DX: E11.09 500 each 3  . potassium chloride (KLOR-CON M10) 10 MEQ tablet Take 2 tablets (20 mEq total) by mouth daily. -- Office visit needed for further refills 180 tablet 1  . vitamin B-12 (CYANOCOBALAMIN) 1000 MCG tablet Take 1,000 mcg by mouth daily.    Alveda Reasons 20 MG TABS tablet TAKE 1 TABLET (20 MG TOTAL) BY MOUTH DAILY WITH SUPPER. 90 tablet 0  . furosemide (LASIX) 40 MG tablet Take 1 tablet (40 mg total) by mouth daily. (Patient not taking: Reported on 03/27/2018) 90 tablet 2  . gabapentin (NEURONTIN) 600 MG tablet Take 600 mg by mouth 2 (two) times daily.     No facility-administered medications prior to visit.     Allergies  Allergen Reactions  . Actos [Pioglitazone] Other (See Comments)    Weight gain  . Benzocaine-Menthol Swelling    SWELLING OF MOUTH  . Colesevelam Other (See Comments)    GI UPSET  . Flagyl [Metronidazole Hcl] Other (See Comments)    DIAPHORESIS  . Metformin And Related Diarrhea  . Omeprazole Swelling    SWELLING OF TONGUE AND THROAT  . Shrimp [Shellfish Allergy] Itching    OF THROAT AND EARS  . Statins Other (See Comments)    HEART RACING  . Desipramine Hcl Itching, Nausea Only and Other (See Comments)    "swimmy" headed, ears itched   . Hydromorphone Itching  . Jardiance  [Empagliflozin] Other (See Comments)    weakness  . Adhesive [Tape] Other (See Comments)    SKIN IRRITATION AND BRUISING  . Nisoldipine Itching  . Percocet [Oxycodone-Acetaminophen] Itching    ROS As per HPI  PE: Blood pressure 110/72, pulse 63, temperature 98.4 F (36.9 C), temperature source Oral, resp. rate 16, height 5' 1"  (1.549 m), weight 223 lb (101.2 kg), SpO2 96 %. Gen: Alert, well appearing.  Patient is oriented to person, place, time, and situation. AFFECT: pleasant, lucid thought and speech. CV: irreg irreg, rate 70s, no m/r Chest is clear, no wheezing or rales. Normal symmetric air entry throughout both lung fields. No chest wall deformities or tenderness. EXT: 1-2+ pitting edema in both LL's  LABS:  Lab Results  Component Value Date   TSH 1.01 12/18/2016   Lab Results  Component Value Date   WBC 5.7 11/13/2017   HGB 12.6 11/13/2017   HCT 37.7 11/13/2017   MCV 92.2 11/13/2017   PLT 167.0 11/13/2017   Lab Results  Component Value Date   CREATININE 0.60 12/25/2017   BUN 14 12/25/2017   NA 138 12/25/2017   K 4.3 12/25/2017   CL 102 12/25/2017   CO2 30 12/25/2017   Lab Results  Component Value Date   ALT 72 (H) 12/25/2017   AST 70 (H) 12/25/2017   ALKPHOS 69 12/25/2017   BILITOT 0.6 12/25/2017   Lab Results  Component Value Date   CHOL 224 (H) 12/01/2017   Lab Results  Component Value Date   HDL 38.60 (L) 12/01/2017   Lab Results  Component Value Date   LDLCALC 156 (H) 12/01/2017   Lab Results  Component Value Date   TRIG 149.0 12/01/2017   Lab Results  Component Value Date   CHOLHDL 6 12/01/2017   Lab Results  Component Value Date   HGBA1C 6.9 (H) 12/25/2017    IMPRESSION AND PLAN:  1) DM 2: home  glucose control still pretty erratic. Inc U-500 to 112 U qBF, 125 U at "lupper" and 115 U evening meal.  Continue trulicity 4.4CQ SQ q week. HbA1c today.  CMET today.  2) Chronic dep/anx: The current medical regimen is effective;   continue present plan and medications. Citalopram qd, no benzos required at this time.  3) Morbid obesity with NASH:  Strongly encouraged pt to make appt at Dr. Valentina Gu clinic--info given to pt again today. CMET today.  4) Small fiber neuropathy--suspect secondary to DM. Gabapentin providing good relief for feet sx's at 900 mg bid dosing but hands sx's unchanged.  Sedation prevents pushing gabapentin dose any further:  Will research other meds to add to gabapentin (depakote, carbamaz, dilantin, etc)--remember she has increased LFTs. In review of pt's past meds in EMR: she has already been tried on nortriptyline, amitriptyline, desiprimine, cymbalta, pristiq, lyrica, effexor xr, and depakote.  An After Visit Summary was printed and given to the patient.  FOLLOW UP: 3 mo RCI  Signed:  Crissie Sickles, MD           03/27/2018  ADDENDUM  04/09/18: her only other med options for treating her neuropathic pain are opioids or tramadol. Will discuss with pt at next o/v.  Signed:  Crissie Sickles, MD           04/09/2018

## 2018-03-30 ENCOUNTER — Telehealth: Payer: Self-pay | Admitting: Family Medicine

## 2018-03-30 NOTE — Telephone Encounter (Signed)
Rx for insulin was sent on 03/27/18. Called pharmacy to confirm receipt they did receive Rx and it is too early for pt to fill. Rx was just filled. Pt is not due for refill until around 04/18/18.  Left message for pt to call back.

## 2018-03-30 NOTE — Telephone Encounter (Signed)
Patient was told to increase insulin at last OV and patient now needs new instructions / quantity for insulin sent to [place], pharmacy.

## 2018-04-01 ENCOUNTER — Other Ambulatory Visit: Payer: Self-pay | Admitting: Pharmacist

## 2018-04-01 MED ORDER — RIVAROXABAN 20 MG PO TABS: 20.0000 mg | ORAL_TABLET | Freq: Every day | ORAL | 0 refills | Status: DC

## 2018-04-01 NOTE — Telephone Encounter (Signed)
Patient aware.

## 2018-04-11 ENCOUNTER — Other Ambulatory Visit: Payer: Self-pay | Admitting: Internal Medicine

## 2018-04-15 ENCOUNTER — Other Ambulatory Visit (INDEPENDENT_AMBULATORY_CARE_PROVIDER_SITE_OTHER): Payer: Self-pay | Admitting: Internal Medicine

## 2018-04-15 ENCOUNTER — Other Ambulatory Visit: Payer: Self-pay | Admitting: Internal Medicine

## 2018-04-23 ENCOUNTER — Other Ambulatory Visit: Payer: Self-pay | Admitting: Internal Medicine

## 2018-04-24 ENCOUNTER — Other Ambulatory Visit: Payer: Self-pay

## 2018-04-24 MED ORDER — FUROSEMIDE 40 MG PO TABS: 40.0000 mg | ORAL_TABLET | Freq: Every day | ORAL | 0 refills | Status: DC

## 2018-05-19 ENCOUNTER — Other Ambulatory Visit: Payer: Self-pay | Admitting: Family Medicine

## 2018-05-19 NOTE — Telephone Encounter (Signed)
RF request for furosemide LOV: 03/27/18 Next ov: 06/29/18 Last written: 04/24/18 #30 w/ 0RF  Please advise. Thanks.

## 2018-06-04 ENCOUNTER — Telehealth: Payer: Self-pay | Admitting: Family Medicine

## 2018-06-04 NOTE — Telephone Encounter (Signed)
Patient dropped off [employer] paperwork to be completed with a deadline of 06/24/18. Patient requests a call when it is complete. Patient mentioned that prior PCP Dr. Quay Burow had completed the form in the past and that she thinks we scanned it in to her chart.

## 2018-06-05 NOTE — Telephone Encounter (Signed)
Form placed on Dr. Isla Pence desk for review/completion.

## 2018-06-09 NOTE — Telephone Encounter (Signed)
Form completed, placed on your desk.

## 2018-06-09 NOTE — Telephone Encounter (Signed)
Pt advised form was ready for p/u at our front desk.  Pt wanted to know what all Dr. Anitra Lauth put on the form. I advised her she could review the copy before we mail the original.   Original (already in envelop) and copy put up front.   She stated that she will be by tomorrow to p/u copy and review it.

## 2018-06-10 NOTE — Telephone Encounter (Signed)
Patient has picked up

## 2018-06-17 LAB — HM DIABETES EYE EXAM

## 2018-06-26 ENCOUNTER — Encounter: Payer: Self-pay | Admitting: Family Medicine

## 2018-06-29 ENCOUNTER — Ambulatory Visit: Payer: Medicare Other | Admitting: Family Medicine

## 2018-06-29 ENCOUNTER — Encounter: Payer: Self-pay | Admitting: Family Medicine

## 2018-06-29 VITALS — BP 111/73 | HR 69 | Temp 98.2°F | Resp 16 | Ht 61.0 in | Wt 223.0 lb

## 2018-06-29 DIAGNOSIS — Z794 Long term (current) use of insulin: Secondary | ICD-10-CM

## 2018-06-29 DIAGNOSIS — Z23 Encounter for immunization: Secondary | ICD-10-CM

## 2018-06-29 DIAGNOSIS — I10 Essential (primary) hypertension: Secondary | ICD-10-CM

## 2018-06-29 DIAGNOSIS — E118 Type 2 diabetes mellitus with unspecified complications: Secondary | ICD-10-CM

## 2018-06-29 DIAGNOSIS — K7581 Nonalcoholic steatohepatitis (NASH): Secondary | ICD-10-CM | POA: Diagnosis not present

## 2018-06-29 LAB — COMPREHENSIVE METABOLIC PANEL
ALBUMIN: 3.5 g/dL (ref 3.5–5.2)
ALT: 91 U/L — ABNORMAL HIGH (ref 0–35)
AST: 124 U/L — ABNORMAL HIGH (ref 0–37)
Alkaline Phosphatase: 85 U/L (ref 39–117)
BUN: 14 mg/dL (ref 6–23)
CALCIUM: 9.1 mg/dL (ref 8.4–10.5)
CHLORIDE: 100 meq/L (ref 96–112)
CO2: 29 meq/L (ref 19–32)
Creatinine, Ser: 0.65 mg/dL (ref 0.40–1.20)
GFR: 95.54 mL/min (ref >60.00)
Glucose, Bld: 164 mg/dL — ABNORMAL HIGH (ref 70–99)
POTASSIUM: 4 meq/L (ref 3.5–5.1)
Sodium: 137 mEq/L (ref 135–145)
Total Bilirubin: 0.7 mg/dL (ref 0.2–1.2)
Total Protein: 7.1 g/dL (ref 6.0–8.3)

## 2018-06-29 LAB — HEMOGLOBIN A1C: Hgb A1c MFr Bld: 7.9 % — ABNORMAL HIGH (ref 4.6–6.5)

## 2018-06-29 NOTE — Patient Instructions (Signed)
Increase your morning insulin dose to 116 units and increase your mid day insulin to 129 units. Continue same supper insulin dose (115 units).

## 2018-06-29 NOTE — Progress Notes (Signed)
OFFICE VISIT  06/29/2018   CC:  Chief Complaint  Patient presents with  . Follow-up    RCI, not fasting.     HPI:    Patient is a 72 y.o. Caucasian female who presents for 3 mo f/u DM 2 with DPN, HTN, and morbid obesity with NASH. She has PAF managed by Dr. Stanford Breed.  Hx of hyperlipidemia-->intolerant of statins.  She has UC and is on remicade.  DM 2: Trulicity 8.3TD q week.  Insulin U500 regimen-->112 BF  125 lunch   115 supper Fastings avg 150 but wide range, mid day and hs avg 260s or so but wide range as well. As usual, she cannot detect any pattern to her ups and downs.  No hypoglycemia. DPN: gabapentin 900 qAM and qPM helps feet and LL's neuropathic pain but hands sx's not improved as much. Position sense impaired on feet and she sometimes feels unstable on feet: no falls.  HTN: no home bp monitoring to report. She is not exercising or dieting at this time.   ROS: no CP, no SOB, no wheezing, no cough, no dizziness, no HAs, no rashes, no melena/hematochezia.  No polyuria or polydipsia.  No myalgias or arthralgias.   Past Medical History:  Diagnosis Date  . Anxiety   . 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 Fibromyalgia   . GERD   . History of thrombocytopenia 12/2011  . Hyperlipidemia    Intolerant of statins  . HYPERTENSION   . IBS (irritable bowel syndrome)    -D  . IDDM (insulin dependent diabetes mellitus) ([place])    with DPN (managed by Dr. Cruzita Lederer but then in 2018 pt preferred to have me manage for her convenience)  . Limited mobility    Requires a walker for arthritic pain, widespread musculoskeletal pain, and neuropathic pain.  . Morbid obesity ([place])   . Nonalcoholic steatohepatitis    Viral Hep screens NEG.  CT 2015.  Transaminasemia.  Marland Kitchen OSA (obstructive sleep apnea) 09/14/2015   sleep study 09/07/15: severe obstructive sleep apnea with an AHI of 72 and SaO2 low of 75%.>refer to sleep med for eval and tx options  . Osteoarthritis    hips, shoulders, knees  . PAF (paroxysmal atrial fibrillation) ([place])    One documented episode (after getting EGD 2016).  Was on amiodarone x 3 mo.  Rate control with metoprolol + anticoag with xarelto.   . Small fiber neuropathy    Due to DM.  Symmetric hands and feet tingling/numbness.  Marland Kitchen Ulcerative colitis ([place])    Remicade infusion Q 8 weeks    Past Surgical History:  Procedure Laterality Date  . ABDOMINAL HYSTERECTOMY  1980   Paps no longer indicated.  Marland Kitchen BACTERIAL OVERGROWTH TEST N/A 07/13/2015   Procedure: BACTERIAL OVERGROWTH TEST;  Surgeon: Rogene Houston, MD;  [facility];  Service: Endoscopy;  Laterality: N/A;  730    . BILATERAL SALPINGOOPHORECTOMY  02/10/2001  . BREAST REDUCTION SURGERY  1994   bilat  . CARDIOVASCULAR STRESS TEST  07/2010   Lexiscan myoview: normal  . CARPAL TUNNEL RELEASE Right 1996  . CARPAL TUNNEL RELEASE Left 03/21/2003  . CARPAL TUNNEL RELEASE Right 05/04/2013   Procedure: CARPAL TUNNEL RELEASE;  Surgeon: Cammie Sickle., MD;  Location: [facility];  Service: Orthopedics;  Laterality: Right;  . CARPAL TUNNEL RELEASE Left 09/21/2013   Procedure: LEFT CARPAL TUNNEL RELEASE;  Surgeon: Cammie Sickle., MD;  Location: [facility];  Service: Orthopedics;  Laterality: Left;  . CHOLECYSTECTOMY    . COLONOSCOPY WITH PROPOFOL N/A 08/04/2015   Colitis in remission.  No polyps.  Procedure: COLONOSCOPY WITH PROPOFOL;  Surgeon: Rogene Houston, MD;  [facility];  Service: Endoscopy;  Laterality: N/A;  cecum time in  0820   time out  0827    total time 7 minutes  . ESOPHAGEAL DILATION N/A 08/04/2015   Procedure: ESOPHAGEAL DILATION;  Surgeon: Rogene Houston, MD;  [facility];  Service: Endoscopy;  Laterality: N/A;  Maloney 56, no mucousal disruption  . ESOPHAGOGASTRODUODENOSCOPY (EGD) WITH ESOPHAGEAL DILATION  12/02/2005  . ESOPHAGOGASTRODUODENOSCOPY (EGD) WITH PROPOFOL N/A 08/04/2015   Procedure:  ESOPHAGOGASTRODUODENOSCOPY (EGD) WITH PROPOFOL;  Surgeon: Rogene Houston, MD;  [facility];  Service: Endoscopy;  Laterality: N/A;  procedure 1  . FLEXIBLE SIGMOIDOSCOPY  01/17/2012   Procedure: FLEXIBLE SIGMOIDOSCOPY;  Surgeon: Rogene Houston, MD;  [facility];  Service: Endoscopy;  Laterality: N/A;  . HEMILAMINOTOMY LUMBAR SPINE Bilateral 09/07/1999   L4-5  . KNEE ARTHROSCOPY Right 01/1999; 10/2000  . LYSIS OF ADHESION  02/10/2001  . [place]; 09/12/2006  . TARSAL TUNNEL RELEASE  2002  . TRANSTHORACIC ECHOCARDIOGRAM  08/04/2015   EF 60-65%, normal wall motion, mild LVH, mild LA dilation, grd I DD.  Marland Kitchen TUMOR EXCISION Left 03/21/2003   dorsal 1st web space (hand)  . URETEROLYSIS Right 02/10/2001    Outpatient Medications Prior to Visit  Medication Sig Dispense Refill  . citalopram (CELEXA) 20 MG tablet TAKE 1 TABLET BY MOUTH EVERY DAY 90 tablet 1  . dicyclomine (BENTYL) 10 MG capsule TAKE 1 CAPSULE (10 MG TOTAL) BY MOUTH 3 (THREE) TIMES DAILY BEFORE MEALS. 270 capsule 0  . Dulaglutide (TRULICITY) 1.5 MV/6.7MC SOPN Inject under skin 1.5 mg weekly under skin. 12 pen 1  . esomeprazole (NEXIUM) 20 MG capsule Take 20 mg by mouth daily at 12 noon.    . furosemide (LASIX) 40 MG tablet TAKE 1 TABLET BY MOUTH EVERY DAY 90 tablet 1  . gabapentin (NEURONTIN) 300 MG capsule 3-4 caps po bid (Patient taking differently: Take 300 mg by mouth 2 (two) times daily. 3-4 caps po bid) 720 capsule 3  . gabapentin (NEURONTIN) 600 MG tablet TAKE 1 TABLET BY MOUTH TWICE A DAY 180 tablet 1  . glucose blood (ONE TOUCH ULTRA TEST) test strip Use as instructed to check sugar 6 times daily 400 each 5  . inFLIXimab (REMICADE) 100 MG injection Inject into the vein every 8 (eight) weeks.     . Insulin Pen Needle (B-D UF III MINI PEN NEEDLES) 31G X 5 MM MISC USE TO INJECT INSULINS EQUAL TO 6 TIMES DAILY. 600 each 5  . insulin regular human CONCENTRATED (HUMULIN R U-500 KWIKPEN) 500 UNIT/ML kwikpen  112 U SQ BF, 125 U SQ "lupper", and 115 U SQ at evening meal. 24 pen 1  . lactase (LACTAID) 3000 UNITS tablet Take 3,000 Units by mouth as needed (when eating foods containing dairy).     Marland Kitchen loperamide (IMODIUM) 2 MG capsule Take 2 mg by mouth 3 (three) times daily as needed for diarrhea or loose stools.     . meclizine (ANTIVERT) 25 MG tablet Take 1 tablet (25 mg total) by mouth 3 (three) times daily as needed for dizziness or nausea. 90 tablet 0  . metoprolol succinate (TOPROL-XL) 100 MG 24 hr tablet TAKE 1 TABLET BY MOUTH 2 TIMES DAILY.  TAKE WITH OR IMMEDIATELY FOLLOWING A MEAL 180 tablet 3  . ONETOUCH DELICA LANCETS 49F MISC PATIENT USES ONE TOUCH DELICAL 02O LANCETS. Use as directed to check blood sugar up to 6 times per day. DX: E11.09 500 each 3  . potassium chloride (KLOR-CON M10) 10 MEQ tablet Take 2 tablets (20 mEq total) by mouth daily. -- Office visit needed for further refills (Patient taking differently: Take 10 mEq by mouth 2 (two) times daily. ) 180 tablet 1  . rivaroxaban (XARELTO) 20 MG TABS tablet Take 1 tablet (20 mg total) by mouth daily with supper. 90 tablet 0  . vitamin B-12 (CYANOCOBALAMIN) 1000 MCG tablet Take 1,000 mcg by mouth daily.     No facility-administered medications prior to visit.     Allergies  Allergen Reactions  . Actos [Pioglitazone] Other (See Comments)    Weight gain  . Benzocaine-Menthol Swelling    SWELLING OF MOUTH  . Colesevelam Other (See Comments)    GI UPSET  . Flagyl [Metronidazole Hcl] Other (See Comments)    DIAPHORESIS  . Metformin And Related Diarrhea  . Omeprazole Swelling    SWELLING OF TONGUE AND THROAT  . Shrimp [Shellfish Allergy] Itching    OF THROAT AND EARS  . Statins Other (See Comments)    HEART RACING  . Desipramine Hcl Itching, Nausea Only and Other (See Comments)    "swimmy" headed, ears itched   . Hydromorphone Itching  . Jardiance [Empagliflozin] Other (See Comments)    weakness  . Adhesive [Tape] Other (See  Comments)    SKIN IRRITATION AND BRUISING  . Nisoldipine Itching  . Percocet [Oxycodone-Acetaminophen] Itching    ROS As per HPI  PE: Blood pressure 111/73, pulse 69, temperature 98.2 F (36.8 C), temperature source Oral, resp. rate 16, height 5' 1"  (1.549 m), weight 223 lb (101.2 kg), SpO2 94 %. Gen: Alert, well appearing.  Patient is oriented to person, place, time, and situation. AFFECT: pleasant, lucid thought and speech. CV: RRR, no m/r/g.   LUNGS: CTA bilat, nonlabored resps, good aeration in all lung fields. EXT: no clubbing or cyanosis.  No pitting edema.    LABS:  Lab Results  Component Value Date   TSH 1.01 12/18/2016   Lab Results  Component Value Date   WBC 5.7 11/13/2017   HGB 12.6 11/13/2017   HCT 37.7 11/13/2017   MCV 92.2 11/13/2017   PLT 167.0 11/13/2017   Lab Results  Component Value Date   CREATININE 0.62 03/27/2018   BUN 10 03/27/2018   NA 137 03/27/2018   K 4.3 03/27/2018   CL 101 03/27/2018   CO2 28 03/27/2018   Lab Results  Component Value Date   ALT 139 (H) 03/27/2018   AST 172 (H) 03/27/2018   ALKPHOS 85 03/27/2018   BILITOT 0.5 03/27/2018   Lab Results  Component Value Date   CHOL 224 (H) 12/01/2017   Lab Results  Component Value Date   HDL 38.60 (L) 12/01/2017   Lab Results  Component Value Date   LDLCALC 156 (H) 12/01/2017   Lab Results  Component Value Date   TRIG 149.0 12/01/2017   Lab Results  Component Value Date   CHOLHDL 6 12/01/2017   Lab Results  Component Value Date   HGBA1C 7.4 (H) 03/27/2018    IMPRESSION AND PLAN:  1) Increase morning insulin to 116 U, increase mid day insulin to 129 U, and keep supper insulin dose at 115 U. Continue trulicity 3.7CH q week. A1c  today. Her DPN is affecting her position sense in feet.  I offered PT to help with gait but she declined at this time.  2) NASH, with morbid obesity.  Wt is stable the last 3 months. Her last liver imaging was on a CT in 2015.  With her  liver enzymes being chronically up I will recheck an abd u/s to monitor for any progressive liver changes. CMET today. She continues to be hesitant about initiating an appt with Dr. Kipp [facility]rence wt loss clinic.  3) HTN: bp good on metoprolol. Lytes/cr today.  An After Visit Summary was printed and given to the patient.  FOLLOW UP: Return in about 3 months (around 09/28/2018) for routine chronic illness f/u.  Signed:  Crissie Sickles, MD           06/29/2018

## 2018-06-30 ENCOUNTER — Other Ambulatory Visit: Payer: Self-pay

## 2018-06-30 MED ORDER — RIVAROXABAN 20 MG PO TABS: 20.0000 mg | ORAL_TABLET | Freq: Every day | ORAL | 0 refills | Status: DC

## 2018-07-06 NOTE — Progress Notes (Signed)
HPI: FU atrial fibrillation. Stress echocardiogram performed in September of 2011 was interpreted as normal. There was mention that LV function may not have improved as much as expected and if clinical suspicion high consider alternative imaging. Lexiscan Myoview in November of 2011 showed normal perfusion. Study not gated. CardioNet showed sinus with PVCs. Had EGD in November 2016 and developed atrial fibrillation. Placed on amiodarone and xarelto. Echocardiogram November 2016 showed normal LV function, moderate left ventricular hypertrophy, grade 1 diastolic dysfunction and mild left atrial enlargement. TSH November 2016 normal. Amiodarone DCed at previousov. Since she was last seen,  there is no chest pain, dyspnea, or syncope.  She has had occasional nosebleeds.  She also feels occasional palpitations.  Current Outpatient Medications  Medication Sig Dispense Refill  . citalopram (CELEXA) 20 MG tablet TAKE 1 TABLET BY MOUTH EVERY DAY 90 tablet 1  . dicyclomine (BENTYL) 10 MG capsule TAKE 1 CAPSULE (10 MG TOTAL) BY MOUTH 3 (THREE) TIMES DAILY BEFORE MEALS. 270 capsule 0  . Dulaglutide (TRULICITY) 1.5 DQ/2.2WL SOPN Inject under skin 1.5 mg weekly under skin. 12 pen 1  . esomeprazole (NEXIUM) 20 MG capsule Take 20 mg by mouth daily at 12 noon.    . furosemide (LASIX) 40 MG tablet TAKE 1 TABLET BY MOUTH EVERY DAY 90 tablet 1  . gabapentin (NEURONTIN) 300 MG capsule 3-4 caps po bid (Patient taking differently: Take 300 mg by mouth 2 (two) times daily. 3-4 caps po bid) 720 capsule 3  . gabapentin (NEURONTIN) 600 MG tablet TAKE 1 TABLET BY MOUTH TWICE A DAY 180 tablet 1  . glucose blood (ONE TOUCH ULTRA TEST) test strip Use as instructed to check sugar 6 times daily 400 each 5  . inFLIXimab (REMICADE) 100 MG injection Inject into the vein every 8 (eight) weeks.     . Insulin Pen Needle (B-D UF III MINI PEN NEEDLES) 31G X 5 MM MISC USE TO INJECT INSULINS EQUAL TO 6 TIMES DAILY. 600 each 5  .  insulin regular human CONCENTRATED (HUMULIN R U-500 KWIKPEN) 500 UNIT/ML kwikpen 112 U SQ BF, 125 U SQ "lupper", and 115 U SQ at evening meal. 24 pen 1  . lactase (LACTAID) 3000 UNITS tablet Take 3,000 Units by mouth as needed (when eating foods containing dairy).     Marland Kitchen loperamide (IMODIUM) 2 MG capsule Take 2 mg by mouth 3 (three) times daily as needed for diarrhea or loose stools.     . meclizine (ANTIVERT) 25 MG tablet Take 1 tablet (25 mg total) by mouth 3 (three) times daily as needed for dizziness or nausea. 90 tablet 0  . metoprolol succinate (TOPROL-XL) 100 MG 24 hr tablet TAKE 1 TABLET BY MOUTH 2 TIMES DAILY. TAKE WITH OR IMMEDIATELY FOLLOWING A MEAL 180 tablet 3  . ONETOUCH DELICA LANCETS 79G MISC PATIENT USES ONE TOUCH DELICAL 92J LANCETS. Use as directed to check blood sugar up to 6 times per day. DX: E11.09 500 each 3  . potassium chloride (KLOR-CON M10) 10 MEQ tablet Take 2 tablets (20 mEq total) by mouth daily. -- Office visit needed for further refills (Patient taking differently: Take 10 mEq by mouth 2 (two) times daily. ) 180 tablet 1  . rivaroxaban (XARELTO) 20 MG TABS tablet Take 1 tablet (20 mg total) by mouth daily with supper. 90 tablet 0  . vitamin B-12 (CYANOCOBALAMIN) 1000 MCG tablet Take 1,000 mcg by mouth daily.     No current facility-administered medications for this  visit.      Past Medical History:  Diagnosis Date  . Anxiety   . 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 Fibromyalgia   . GERD   . History of thrombocytopenia 12/2011  . Hyperlipidemia    Intolerant of statins  . HYPERTENSION   . IBS (irritable bowel syndrome)    -D  . IDDM (insulin dependent diabetes mellitus) ([place])    with DPN (managed by Dr. Cruzita Lederer but then in 2018 pt preferred to have me manage for her convenience)  . Limited mobility    Requires a walker for arthritic pain, widespread musculoskeletal pain, and neuropathic pain.  . Morbid obesity ([place])   . Nonalcoholic  steatohepatitis    Viral Hep screens NEG.  CT 2015.  Transaminasemia.  Marland Kitchen OSA (obstructive sleep apnea) 09/14/2015   sleep study 09/07/15: severe obstructive sleep apnea with an AHI of 72 and SaO2 low of 75%.>refer to sleep med for eval and tx options  . Osteoarthritis    hips, shoulders, knees  . PAF (paroxysmal atrial fibrillation) ([place])    One documented episode (after getting EGD 2016).  Was on amiodarone x 3 mo.  Rate control with metoprolol + anticoag with xarelto.   . Small fiber neuropathy    Due to DM.  Symmetric hands and feet tingling/numbness.  Marland Kitchen Ulcerative colitis ([place])    Remicade infusion Q 8 weeks    Past Surgical History:  Procedure Laterality Date  . ABDOMINAL HYSTERECTOMY  1980   Paps no longer indicated.  Marland Kitchen BACTERIAL OVERGROWTH TEST N/A 07/13/2015   Procedure: BACTERIAL OVERGROWTH TEST;  Surgeon: Rogene Houston, MD;  [facility];  Service: Endoscopy;  Laterality: N/A;  730    . BILATERAL SALPINGOOPHORECTOMY  02/10/2001  . BREAST REDUCTION SURGERY  1994   bilat  . CARDIOVASCULAR STRESS TEST  07/2010   Lexiscan myoview: normal  . CARPAL TUNNEL RELEASE Right 1996  . CARPAL TUNNEL RELEASE Left 03/21/2003  . CARPAL TUNNEL RELEASE Right 05/04/2013   Procedure: CARPAL TUNNEL RELEASE;  Surgeon: Cammie Sickle., MD;  Location: [facility];  Service: Orthopedics;  Laterality: Right;  . CARPAL TUNNEL RELEASE Left 09/21/2013   Procedure: LEFT CARPAL TUNNEL RELEASE;  Surgeon: Cammie Sickle., MD;  Location: [facility];  Service: Orthopedics;  Laterality: Left;  . CHOLECYSTECTOMY    . COLONOSCOPY WITH PROPOFOL N/A 08/04/2015   Colitis in remission.  No polyps.  Procedure: COLONOSCOPY WITH PROPOFOL;  Surgeon: Rogene Houston, MD;  Location: [facility];  Service: Endoscopy;  Laterality: N/A;  cecum time in  0820   time out  0827    total time 7 minutes  . ESOPHAGEAL DILATION N/A 08/04/2015   Procedure: ESOPHAGEAL DILATION;  Surgeon:  Rogene Houston, MD;  Location: [facility];  Service: Endoscopy;  Laterality: N/A;  [place] 57, no mucousal disruption  . ESOPHAGOGASTRODUODENOSCOPY (EGD) WITH ESOPHAGEAL DILATION  12/02/2005  . ESOPHAGOGASTRODUODENOSCOPY (EGD) WITH PROPOFOL N/A 08/04/2015   Procedure: ESOPHAGOGASTRODUODENOSCOPY (EGD) WITH PROPOFOL;  Surgeon: Rogene Houston, MD;  Location: [facility];  Service: Endoscopy;  Laterality: N/A;  procedure 1  . FLEXIBLE SIGMOIDOSCOPY  01/17/2012   Procedure: FLEXIBLE SIGMOIDOSCOPY;  Surgeon: Rogene Houston, MD;  [facility];  Service: Endoscopy;  Laterality: N/A;  . HEMILAMINOTOMY LUMBAR SPINE Bilateral 09/07/1999   L4-5  . KNEE ARTHROSCOPY Right 01/1999; 10/2000  . LYSIS OF ADHESION  02/10/2001  . RECTOCELE REPAIR  1990; 09/12/2006  . TARSAL TUNNEL RELEASE  2002  . TRANSTHORACIC ECHOCARDIOGRAM  08/04/2015   EF 60-65%, normal wall motion, mild LVH, mild LA dilation, grd I DD.  Marland Kitchen TUMOR EXCISION Left 03/21/2003   dorsal 1st web space (hand)  . URETEROLYSIS Right 02/10/2001    Social History   Socioeconomic History  . Marital status: Married    Spouse name: Not on file  . Number of children: Not on file  . Years of education: Not on file  . Highest education level: Not on file  Occupational History  . Occupation: Retired  Scientific laboratory technician  . Financial resource strain: Not on file  . Food insecurity:    Worry: Not on file    Inability: Not on file  . Transportation needs:    Medical: Not on file    Non-medical: Not on file  Tobacco Use  . Smoking status: Never Smoker  . Smokeless tobacco: Never Used  Substance and Sexual Activity  . Alcohol use: Yes    Alcohol/week: 0.0 standard drinks    Comment: very rare  . Drug use: No  . Sexual activity: Not Currently    Partners: Male    Birth control/protection: Surgical, Abstinence    Comment: hysterectomy  Lifestyle  . Physical activity:    Days per week: Not on file    Minutes per session: Not on file  . Stress: Not on  file  Relationships  . Social connections:    Talks on phone: Not on file    Gets together: Not on file    Attends religious service: Not on file    Active member of club or organization: Not on file    Attends meetings of clubs or organizations: Not on file    Relationship status: Not on file  . Intimate partner violence:    Fear of current or ex partner: Not on file    Emotionally abused: Not on file    Physically abused: Not on file    Forced sexual activity: Not on file  Other Topics Concern  . Not on file  Social History Narrative   She lives with husband in two-story home.  They have one grown son and 2 grandchildren.   She is retired [employer].   Highest of level education:  Some college.   Never smoker.   Alcohol: rare.    Family History  Problem Relation Age of Onset  . Diabetes Mother   . Hypertension Mother   . Heart attack Father        Mid 74's  . Heart disease Father   . Lung disease Father        spot on lung; had lung surgery  . Alcohol abuse Other   . Hypertension Son   . Diabetes Son   . Emphysema Maternal Grandfather   . Asthma Maternal Grandfather   . Colon cancer Paternal Grandfather   . Stomach cancer Paternal Grandmother   . Neuropathy Neg Hx     ROS: no fevers or chills, productive cough, hemoptysis, dysphasia, odynophagia, melena, hematochezia, dysuria, hematuria, rash, seizure activity, orthopnea, PND, pedal edema, claudication. Remaining systems are negative.  Physical Exam: Well-developed obese in no acute distress.  Skin is warm and dry.  HEENT is normal.  Neck is supple.  Chest is clear to auscultation with normal expansion.  Cardiovascular exam is regular rate and rhythm.  Abdominal exam nontender or distended. No masses palpated. Extremities show trace edema. neuro grossly intact  ECG-sinus rhythm with atrial bigeminy.  Cannot rule out anterior infarct.  Low voltage.  Personally reviewed  A/P  1 paroxysmal atrial  fibrillation-patient is in sinus rhythm.  We will continue with metoprolol for rate control if atrial fibrillation recurs.  Continue Xarelto.  2 Hypertension-patient's blood pressure is controlled.  Continue present medications and follow.  3 hyperlipidemia-managed by primary care.  Kirk Ruths, MD

## 2018-07-12 ENCOUNTER — Other Ambulatory Visit (INDEPENDENT_AMBULATORY_CARE_PROVIDER_SITE_OTHER): Payer: Self-pay | Admitting: Internal Medicine

## 2018-07-12 ENCOUNTER — Other Ambulatory Visit: Payer: Self-pay | Admitting: Family Medicine

## 2018-07-20 ENCOUNTER — Telehealth: Payer: Self-pay | Admitting: *Deleted

## 2018-07-20 ENCOUNTER — Ambulatory Visit: Payer: Medicare Other | Admitting: Cardiology

## 2018-07-20 ENCOUNTER — Encounter: Payer: Self-pay | Admitting: Cardiology

## 2018-07-20 VITALS — BP 134/68 | HR 70 | Ht 62.0 in | Wt 224.0 lb

## 2018-07-20 DIAGNOSIS — E7800 Pure hypercholesterolemia, unspecified: Secondary | ICD-10-CM | POA: Diagnosis not present

## 2018-07-20 DIAGNOSIS — I480 Paroxysmal atrial fibrillation: Secondary | ICD-10-CM

## 2018-07-20 DIAGNOSIS — I10 Essential (primary) hypertension: Secondary | ICD-10-CM | POA: Diagnosis not present

## 2018-07-20 NOTE — Patient Instructions (Signed)

## 2018-07-20 NOTE — Telephone Encounter (Signed)
refill 

## 2018-07-21 MED ORDER — RIVAROXABAN 20 MG PO TABS: 20.0000 mg | ORAL_TABLET | Freq: Every day | ORAL | 1 refills | Status: DC

## 2018-07-21 NOTE — Addendum Note (Signed)
Addended by: Rockne Menghini on: 07/21/2018 11:00 AM   Modules accepted: Orders

## 2018-07-27 ENCOUNTER — Other Ambulatory Visit: Payer: Self-pay | Admitting: Internal Medicine

## 2018-07-28 ENCOUNTER — Encounter (INDEPENDENT_AMBULATORY_CARE_PROVIDER_SITE_OTHER): Payer: Self-pay | Admitting: Internal Medicine

## 2018-07-28 ENCOUNTER — Ambulatory Visit (INDEPENDENT_AMBULATORY_CARE_PROVIDER_SITE_OTHER): Payer: Medicare Other | Admitting: Internal Medicine

## 2018-07-28 ENCOUNTER — Ambulatory Visit (HOSPITAL_COMMUNITY): Payer: Medicare Other

## 2018-07-28 VITALS — BP 130/66 | HR 64 | Temp 97.5°F | Resp 18 | Ht 61.0 in | Wt 225.1 lb

## 2018-07-28 DIAGNOSIS — K5190 Ulcerative colitis, unspecified, without complications: Secondary | ICD-10-CM | POA: Diagnosis not present

## 2018-07-28 DIAGNOSIS — R945 Abnormal results of liver function studies: Secondary | ICD-10-CM | POA: Diagnosis not present

## 2018-07-28 DIAGNOSIS — R7989 Other specified abnormal findings of blood chemistry: Secondary | ICD-10-CM

## 2018-07-28 DIAGNOSIS — K580 Irritable bowel syndrome with diarrhea: Secondary | ICD-10-CM | POA: Diagnosis not present

## 2018-07-28 NOTE — Progress Notes (Signed)
Presenting complaint;  Follow-up for ulcerative colitis and IBS.  Subjective:  Patient is 72 year old Caucasian female who has chronic ulcerative colitis irritable bowel syndrome as well as elevated transaminases due to fatty liver who is here for scheduled visit.  She was last seen on 01/20/2018.  She remains on infliximab.  Last dose was on 06/20/2018.  She is not having any side effects.  On her last visit she was begun on dicyclomine.  She was also advised to take loperamide 2 mg every morning.  She reports remarkable improvement in diarrhea.  Now she is having 2 stools per day.  Some of her stools are loose but most are formed.  She denies  melena or rectal bleeding.  She says she has quit drinking caffeine containing products.  She drinks one diet Coke daily.  She is not having any side effects with dicyclomine.  She may have transient lower abdominal pain prior to her bowel movement.  She says since she has been on dicyclomine she has not had any more explosive bowel movements. She has gained another 5 pounds.  She says she is not able to do much walking or physical activity. She had blood work by Dr. Julien Nordmann and her transaminases were somewhat higher than before.  She is scheduled for liver ultrasound.    Current Medications: Outpatient Encounter Medications as of 07/28/2018  Medication Sig  . citalopram (CELEXA) 20 MG tablet TAKE 1 TABLET BY MOUTH EVERY DAY  . dicyclomine (BENTYL) 10 MG capsule TAKE 1 CAPSULE (10 MG TOTAL) BY MOUTH 3 (THREE) TIMES DAILY BEFORE MEALS.  . Dulaglutide (TRULICITY) 1.5 RF/1.6BW SOPN Inject under skin 1.5 mg weekly under skin.  Marland Kitchen esomeprazole (NEXIUM) 20 MG capsule Take 20 mg by mouth daily at 12 noon.  . furosemide (LASIX) 40 MG tablet TAKE 1 TABLET BY MOUTH EVERY DAY  . gabapentin (NEURONTIN) 300 MG capsule 3-4 caps po bid (Patient taking differently: Take 300 mg by mouth 2 (two) times daily. 3-4 caps po bid)  . gabapentin (NEURONTIN) 600 MG tablet  TAKE 1 TABLET BY MOUTH TWICE A DAY  . glucose blood (ONE TOUCH ULTRA TEST) test strip Use as instructed to check sugar 6 times daily  . inFLIXimab (REMICADE) 100 MG injection Inject into the vein every 8 (eight) weeks.   . Insulin Pen Needle (B-D UF III MINI PEN NEEDLES) 31G X 5 MM MISC USE TO INJECT INSULINS EQUAL TO 6 TIMES DAILY.  Marland Kitchen insulin regular human CONCENTRATED (HUMULIN R U-500 KWIKPEN) 500 UNIT/ML kwikpen 112 U SQ BF, 125 U SQ "lupper", and 115 U SQ at evening meal.  . lactase (LACTAID) 3000 UNITS tablet Take 3,000 Units by mouth as needed (when eating foods containing dairy).   Marland Kitchen loperamide (IMODIUM) 2 MG capsule Take 2 mg by mouth 3 (three) times daily as needed for diarrhea or loose stools.   . meclizine (ANTIVERT) 25 MG tablet Take 1 tablet (25 mg total) by mouth 3 (three) times daily as needed for dizziness or nausea.  . metoprolol succinate (TOPROL-XL) 100 MG 24 hr tablet TAKE 1 TABLET BY MOUTH 2 TIMES DAILY. TAKE WITH OR IMMEDIATELY FOLLOWING A MEAL  . ONETOUCH DELICA LANCETS 46K MISC PATIENT USES ONE TOUCH DELICAL 59D LANCETS. Use as directed to check blood sugar up to 6 times per day. DX: E11.09  . potassium chloride (KLOR-CON M10) 10 MEQ tablet Take 2 tablets (20 mEq total) by mouth daily. -- Office visit needed for further refills (Patient taking differently:  Take 10 mEq by mouth 2 (two) times daily. )  . rivaroxaban (XARELTO) 20 MG TABS tablet Take 1 tablet (20 mg total) by mouth daily with supper.  . vitamin B-12 (CYANOCOBALAMIN) 1000 MCG tablet Take 5,000 mcg by mouth daily.   . [DISCONTINUED] amitriptyline (ELAVIL) 25 MG tablet Take 1 tablet (25 mg total) by mouth at bedtime. start with 1/2 tab at night for 1 week then increase to 1 whole tab.  . [DISCONTINUED] bromocriptine (PARLODEL) 2.5 MG tablet Take 2.5 mg by mouth 2 (two) times daily.    . [DISCONTINUED] potassium chloride (K-DUR) 10 MEQ tablet Take 2 tablets (20 mEq total) by mouth daily.   No facility-administered  encounter medications on file as of 07/28/2018.      Objective: Blood pressure 130/66, pulse 64, temperature (!) 97.5 F (36.4 C), temperature source Oral, resp. rate 18, height 5' 1"  (1.549 m), weight 225 lb 1.6 oz (102.1 kg). Patient is alert and in no acute distress. Conjunctiva is pink. Sclera is nonicteric Oropharyngeal mucosa is normal. No neck masses or thyromegaly noted. Cardiac exam with regular rhythm normal S1 and S2. No murmur or gallop noted. Lungs are clear to auscultation. Abdomen is full.  Bowel sounds are normal.  On palpation abdomen is soft and nontender with organomegaly or masses. She has nonpitting edema to both legs.  Labs/studies Results: Lab data from 06/29/2018  AST 124 and ALT 91.  AST and ALT were 172 and 139 respectively on March 27, 2018  AST and ALT were 70 and 72 respectively on December 25, 2017  Assessment:  #1.  Chronic ulcerative colitis.  She is on infliximab and she is in remission.  She is not having any side effects with infliximab.  She is receiving infusion via clinic in [place].  #2.  Irritable bowel syndrome.  She is doing well with low-dose loperamide and dicyclomine.  She can try decreasing dicyclomine dose to once or twice daily and see how she does.  #3.  Elevated transaminases.  Transaminases have been up and down.  She has been well documented to have fatty liver.  She does not have stigmata of cirrhosis or portal hypertension.  She is scheduled to undergo ultrasound as ordered by Dr. Julien Nordmann.  Will wait for the results.   Plan:  Patient will continue infliximab infusion every 8 weeks. She will call office if she has any side effects. She will also have CBC with differential further next blood work which is planned in December 2019. Will wait for results of ultrasound. She must increase physical activity.  She may consider going to [facility] and do water aerobics as she is not able to do much walking. Office visit in 6  months.

## 2018-07-28 NOTE — Telephone Encounter (Signed)
Please review.Marland Kitchenlast refill was made by different office.

## 2018-07-28 NOTE — Patient Instructions (Signed)
Will review liver ultrasound when completed and make further recommendations.

## 2018-07-29 ENCOUNTER — Other Ambulatory Visit: Payer: Self-pay | Admitting: Family Medicine

## 2018-07-31 DIAGNOSIS — K7460 Unspecified cirrhosis of liver: Secondary | ICD-10-CM

## 2018-07-31 HISTORY — DX: Unspecified cirrhosis of liver: K74.60

## 2018-08-12 ENCOUNTER — Other Ambulatory Visit: Payer: Self-pay

## 2018-08-12 MED ORDER — GLUCOSE BLOOD VI STRP: ORAL_STRIP | 5 refills | Status: DC

## 2018-08-13 ENCOUNTER — Encounter: Payer: Self-pay | Admitting: Family Medicine

## 2018-08-14 ENCOUNTER — Ambulatory Visit (HOSPITAL_COMMUNITY)
Admission: RE | Admit: 2018-08-14 | Discharge: 2018-08-14 | Disposition: A | Discharge status: Home / Self Care | Payer: Medicare Other | Source: Ambulatory Visit | Attending: Family Medicine | Admitting: Family Medicine

## 2018-08-14 DIAGNOSIS — K7581 Nonalcoholic steatohepatitis (NASH): Secondary | ICD-10-CM | POA: Insufficient documentation

## 2018-08-16 ENCOUNTER — Encounter: Payer: Self-pay | Admitting: Family Medicine

## 2018-08-20 ENCOUNTER — Other Ambulatory Visit: Payer: Self-pay | Admitting: Family Medicine

## 2018-08-20 NOTE — Telephone Encounter (Signed)
RF request for potassium  LOV: 06/29/18 Next ov: 10/15/18 Last written: 01/06/18 #180 w/ 1RF  Please advise. Thanks.

## 2018-08-24 ENCOUNTER — Other Ambulatory Visit: Payer: Self-pay | Admitting: Family Medicine

## 2018-08-25 ENCOUNTER — Other Ambulatory Visit: Payer: Self-pay | Admitting: Family Medicine

## 2018-08-25 MED ORDER — GABAPENTIN 300 MG PO CAPS: ORAL_CAPSULE | ORAL | 1 refills | Status: DC

## 2018-08-25 NOTE — Telephone Encounter (Signed)
OK, will send in gabapentin 600 mg bid and 339m bid, #180 of each, with RF x 1 of each.

## 2018-08-25 NOTE — Telephone Encounter (Signed)
After reviewing her chart, I'm confused about her dosing of this med. Pls clarify with pt what mg of gabapentin she is taking currently, how many tabs at each dose, and how many times per day.-thx

## 2018-08-25 NOTE — Telephone Encounter (Signed)
Pt returned call. Pt says that she is currently taking   Gabapentin 600 MG - 2 pills daily   And also    Gabapentin 300 MG - 2 pills daily   Pt confirmed that she is taking 4 Gabapentin pills daily.   Reconfirmed with pt, she said that that is correct. Pt says that she would like a 90 day supply

## 2018-08-25 NOTE — Telephone Encounter (Signed)
RF request for gabapentin.   Last OV was 9.30.19 Next OV 1.16.20 Last RX 02/17/18 # 720 x 3 RFS.   Please advise.

## 2018-10-07 ENCOUNTER — Ambulatory Visit: Payer: Medicare Other | Admitting: Family Medicine

## 2018-10-08 ENCOUNTER — Other Ambulatory Visit: Payer: Self-pay | Admitting: Family Medicine

## 2018-10-12 ENCOUNTER — Ambulatory Visit: Payer: Medicare Other | Admitting: Family Medicine

## 2018-10-12 ENCOUNTER — Ambulatory Visit: Payer: Medicare Other

## 2018-10-15 ENCOUNTER — Encounter: Payer: Self-pay | Admitting: Family Medicine

## 2018-10-15 ENCOUNTER — Ambulatory Visit: Payer: Medicare Other | Admitting: Family Medicine

## 2018-10-15 VITALS — BP 130/71 | HR 65 | Temp 97.8°F | Resp 16 | Ht 61.0 in | Wt 227.1 lb

## 2018-10-15 DIAGNOSIS — E118 Type 2 diabetes mellitus with unspecified complications: Secondary | ICD-10-CM

## 2018-10-15 DIAGNOSIS — I10 Essential (primary) hypertension: Secondary | ICD-10-CM | POA: Diagnosis not present

## 2018-10-15 DIAGNOSIS — E7800 Pure hypercholesterolemia, unspecified: Secondary | ICD-10-CM | POA: Diagnosis not present

## 2018-10-15 DIAGNOSIS — K7581 Nonalcoholic steatohepatitis (NASH): Secondary | ICD-10-CM

## 2018-10-15 LAB — COMPREHENSIVE METABOLIC PANEL
ALBUMIN: 3.5 g/dL (ref 3.5–5.2)
ALT: 59 U/L — ABNORMAL HIGH (ref 0–35)
AST: 74 U/L — ABNORMAL HIGH (ref 0–37)
Alkaline Phosphatase: 74 U/L (ref 39–117)
BUN: 10 mg/dL (ref 6–23)
CO2: 30 mEq/L (ref 19–32)
CREATININE: 0.66 mg/dL (ref 0.40–1.20)
Calcium: 8.7 mg/dL (ref 8.4–10.5)
Chloride: 104 mEq/L (ref 96–112)
GFR: 88.25 mL/min (ref >60.00)
Glucose, Bld: 137 mg/dL — ABNORMAL HIGH (ref 70–99)
Potassium: 3.7 mEq/L (ref 3.5–5.1)
SODIUM: 140 meq/L (ref 135–145)
TOTAL PROTEIN: 7 g/dL (ref 6.0–8.3)
Total Bilirubin: 0.6 mg/dL (ref 0.2–1.2)

## 2018-10-15 LAB — HEMOGLOBIN A1C: Hgb A1c MFr Bld: 7.1 % — ABNORMAL HIGH (ref 4.6–6.5)

## 2018-10-15 NOTE — Progress Notes (Signed)
OFFICE VISIT  10/15/2018   CC:  Chief Complaint  Patient presents with  . Follow-up    RCI, pt is not fasting.    HPI:    Patient is a 73 y.o. Caucasian female who presents for 3 mo f/u DM 2, HTN, morbid obesity, NASH.  She has PAF managed by Dr. Stanford Breed.  Hx of hyperlipidemia-->intolerant of statins. She saw Dr. Stanford Breed 06/2018 and no changes were made and she was in sinus rhythm at that time. She has UC and is on remicade, followed by Dr. Laural [name].  She is in remission on inflixumab.  I got a liver u/s on her Nov 2019 and it did show some progression of her NASH to the point of showing some changes c/w early cirrhosis.  Again, wt loss stressed as the ultimate treatment for this.  Interim Hx:  Discussed possible referral to bariatric surgeon to see her options.  She has considered going to the non-surgical bariatric clinic for quite a while, but never would commit.  She expresses interest in lap band procedure.  Glucoses: mornings very good, normal. u-500 115-120-115. PP lunch low to mid 200s consistently. Hs usually <200 unless dietary indiscretion. Four episodes of glucoses in 50s + hypoglycemic sx's in the last few months. She does have chronic burning, tingling, and numbness in hands and feet.  Aspercream helps.  HTN: no home bp monitoring.  Compliant with meds.  Obesity/NASH: wt is stable.  Unable to exercise due to chronic knee osteoarthritic pain.  Diet is better last 3 mo.  Past Medical History:  Diagnosis Date  . Anxiety   . 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  . Fibromyalgia   . GERD   . History of thrombocytopenia 12/2011  . Hyperlipidemia    Intolerant of statins  . HYPERTENSION   . IBS (irritable bowel syndrome)    -D.  Good response to bentyl and imodium as of 06/2018 GI f/u.  Marland Kitchen IDDM (insulin dependent diabetes mellitus) ([place])    with  DPN (managed by Dr. Cruzita Lederer but then in 2018 pt preferred to have me manage for her convenience)  . Limited mobility    Requires a walker for arthritic pain, widespread musculoskeletal pain, and neuropathic pain.  . Morbid obesity ([place])   . Nonalcoholic steatohepatitis    Viral Hep screens NEG.  CT 2015.  Transaminasemia.  U/S 07/2018 showed early changes of cirrhosis.  . OSA (obstructive sleep apnea) 09/14/2015   sleep study 09/07/15: severe obstructive sleep apnea with an AHI of 72 and SaO2 low of 75%.>refer to sleep med for eval and tx options  . Osteoarthritis    hips, shoulders, knees  . PAF (paroxysmal atrial fibrillation) ([place])    One documented episode (after getting EGD 2016).  Was on amiodarone x 3 mo.  Rate control with metoprolol + anticoag with xarelto.   . Small fiber neuropathy    Due to DM.  Symmetric hands and feet tingling/numbness.  Marland Kitchen Ulcerative colitis ([place])    Remicade infusion Q 8 weeks: in remission as of 06/2018 GI f/u    Past Surgical History:  Procedure Laterality Date  . ABDOMINAL HYSTERECTOMY  1980   Paps no longer indicated.  Marland Kitchen BACTERIAL OVERGROWTH TEST N/A 07/13/2015   Procedure: BACTERIAL OVERGROWTH TEST;  Surgeon: Rogene Houston, MD;  [facility];  Service: Endoscopy;  Laterality: N/A;  730    .  BILATERAL SALPINGOOPHORECTOMY  02/10/2001  . BREAST REDUCTION SURGERY  1994   bilat  . CARDIOVASCULAR STRESS TEST  07/2010   Lexiscan myoview: normal  . CARPAL TUNNEL RELEASE Right 1996  . CARPAL TUNNEL RELEASE Left 03/21/2003  . CARPAL TUNNEL RELEASE Right 05/04/2013   Procedure: CARPAL TUNNEL RELEASE;  Surgeon: Cammie Sickle., MD;  Location: [place];  Service: Orthopedics;  Laterality: Right;  . CARPAL TUNNEL RELEASE Left 09/21/2013   Procedure: LEFT CARPAL TUNNEL RELEASE;  Surgeon: Cammie Sickle., MD;  Location: [place];  Service: Orthopedics;  Laterality: Left;  . CHOLECYSTECTOMY    . COLONOSCOPY  WITH PROPOFOL N/A 08/04/2015   Colitis in remission.  No polyps.  Procedure: COLONOSCOPY WITH PROPOFOL;  Surgeon: Rogene Houston, MD;  Location: [facility];  Service: Endoscopy;  Laterality: N/A;  cecum time in  0820   time out  0827    total time 7 minutes  . ESOPHAGEAL DILATION N/A 08/04/2015   Procedure: ESOPHAGEAL DILATION;  Surgeon: Rogene Houston, MD;  Location: [facility];  Service: Endoscopy;  Laterality: N/A;  [place] 57, no mucousal disruption  . ESOPHAGOGASTRODUODENOSCOPY (EGD) WITH ESOPHAGEAL DILATION  12/02/2005  . ESOPHAGOGASTRODUODENOSCOPY (EGD) WITH PROPOFOL N/A 08/04/2015   Procedure: ESOPHAGOGASTRODUODENOSCOPY (EGD) WITH PROPOFOL;  Surgeon: Rogene Houston, MD;  [facility];  Service: Endoscopy;  Laterality: N/A;  procedure 1  . FLEXIBLE SIGMOIDOSCOPY  01/17/2012   Procedure: FLEXIBLE SIGMOIDOSCOPY;  Surgeon: Rogene Houston, MD;  [facility];  Service: Endoscopy;  Laterality: N/A;  . HEMILAMINOTOMY LUMBAR SPINE Bilateral 09/07/1999   L4-5  . KNEE ARTHROSCOPY Right 01/1999; 10/2000  . LYSIS OF ADHESION  02/10/2001  . [place]; 09/12/2006  . TARSAL TUNNEL RELEASE  2002  . TRANSTHORACIC ECHOCARDIOGRAM  08/04/2015   EF 60-65%, normal wall motion, mild LVH, mild LA dilation, grd I DD.  Marland Kitchen TUMOR EXCISION Left 03/21/2003   dorsal 1st web space (hand)  . URETEROLYSIS Right 02/10/2001    Outpatient Medications Prior to Visit  Medication Sig Dispense Refill  . citalopram (CELEXA) 20 MG tablet TAKE 1 TABLET BY MOUTH EVERY DAY 90 tablet 1  . dicyclomine (BENTYL) 10 MG capsule TAKE 1 CAPSULE (10 MG TOTAL) BY MOUTH 3 (THREE) TIMES DAILY BEFORE MEALS. 270 capsule 0  . Dulaglutide (TRULICITY) 1.5 GM/0.1UU SOPN Inject under skin 1.5 mg weekly under skin. 12 pen 1  . esomeprazole (NEXIUM) 20 MG capsule Take 20 mg by mouth daily before breakfast.     . furosemide (LASIX) 40 MG tablet TAKE 1 TABLET BY MOUTH EVERY DAY 90 tablet 1  . gabapentin (NEURONTIN) 300 MG capsule 1 cap  po bid (to be taken with a 600 mg gabapentin tab at each dose) 180 capsule 1  . gabapentin (NEURONTIN) 600 MG tablet Take 1 tablet (600 mg total) by mouth 2 (two) times daily. (each dose is to be taken with a 300 mg gabapentin pill 180 tablet 1  . glucose blood (ONE TOUCH ULTRA TEST) test strip Use as instructed to check sugar 6 times daily 400 each 5  . inFLIXimab (REMICADE) 100 MG injection Inject into the vein every 8 (eight) weeks.     . Insulin Pen Needle (B-D UF III MINI PEN NEEDLES) 31G X 5 MM MISC USE TO INJECT INSULINS EQUAL TO 6 TIMES DAILY. 600 each 5  . insulin regular human CONCENTRATED (HUMULIN R U-500 KWIKPEN) 500 UNIT/ML kwikpen Inject 116 units at  Breakfast, 129 units mid-day, and 115 units at evening meal. 24 pen 1  . KLOR-CON M10 10 MEQ tablet TAKE 2 TABLETS (20 MEQ TOTAL) BY MOUTH DAILY. -- OFFICE VISIT NEEDED FOR FURTHER REFILLS 180 tablet 1  . lactase (LACTAID) 3000 UNITS tablet Take 3,000 Units by mouth as needed (when eating foods containing dairy).     . Lancets (ONETOUCH DELICA PLUS [id number]) MISC USE AS DIRECTED TO CHECK BLOOD SUGAR UP TO 6 TIMES PER DAY. DX: E11.09 500 each 3  . loperamide (IMODIUM) 2 MG capsule Take 1 capsule (2 mg total) by mouth daily before breakfast. 30 capsule   . meclizine (ANTIVERT) 25 MG tablet Take 1 tablet (25 mg total) by mouth 3 (three) times daily as needed for dizziness or nausea. 90 tablet 0  . metoprolol succinate (TOPROL-XL) 100 MG 24 hr tablet TAKE 1 TABLET BY MOUTH 2 TIMES DAILY. TAKE WITH OR IMMEDIATELY FOLLOWING A MEAL 180 tablet 3  . rivaroxaban (XARELTO) 20 MG TABS tablet Take 1 tablet (20 mg total) by mouth daily with supper. 90 tablet 1  . vitamin B-12 (CYANOCOBALAMIN) 1000 MCG tablet Take 5,000 mcg by mouth daily.      No facility-administered medications prior to visit.     Allergies  Allergen Reactions  . Actos [Pioglitazone] Other (See Comments)    Weight gain  . Benzocaine-Menthol Swelling    SWELLING OF MOUTH  .  Colesevelam Other (See Comments)    GI UPSET  . Flagyl [Metronidazole Hcl] Other (See Comments)    DIAPHORESIS  . Metformin And Related Diarrhea  . Omeprazole Swelling    SWELLING OF TONGUE AND THROAT  . Shrimp [Shellfish Allergy] Itching    OF THROAT AND EARS  . Statins Other (See Comments)    HEART RACING  . Desipramine Hcl Itching, Nausea Only and Other (See Comments)    "swimmy" headed, ears itched   . Hydromorphone Itching  . Jardiance [Empagliflozin] Other (See Comments)    weakness  . Adhesive [Tape] Other (See Comments)    SKIN IRRITATION AND BRUISING  . Nisoldipine Itching  . Percocet [Oxycodone-Acetaminophen] Itching    ROS As per HPI  PE: Blood pressure 130/71, pulse 65, temperature 97.8 F (36.6 C), temperature source Oral, resp. rate 16, height 5' 1"  (1.549 m), weight 227 lb 2 oz (103 kg), SpO2 95 %. Gen: Alert, well appearing.  Patient is oriented to person, place, time, and situation. AFFECT: pleasant, lucid thought and speech. Foot exam -  no swelling, tenderness or skin or vascular lesions. Color and temperature is normal. NO sensation to monofilament testing on either plantar surface.  Peripheral pulses are palpable. Toenails are normal.   LABS:  Lab Results  Component Value Date   TSH 1.01 12/18/2016   Lab Results  Component Value Date   WBC 5.7 11/13/2017   HGB 12.6 11/13/2017   HCT 37.7 11/13/2017   MCV 92.2 11/13/2017   PLT 167.0 11/13/2017   Lab Results  Component Value Date   CREATININE 0.65 06/29/2018   BUN 14 06/29/2018   NA 137 06/29/2018   K 4.0 06/29/2018   CL 100 06/29/2018   CO2 29 06/29/2018   Lab Results  Component Value Date   ALT 91 (H) 06/29/2018   AST 124 (H) 06/29/2018   ALKPHOS 85 06/29/2018   BILITOT 0.7 06/29/2018   Lab Results  Component Value Date   CHOL 224 (H) 12/01/2017   Lab Results  Component Value Date   HDL 38.60 (  L) 12/01/2017   Lab Results  Component Value Date   LDLCALC 156 (H) 12/01/2017    Lab Results  Component Value Date   TRIG 149.0 12/01/2017   Lab Results  Component Value Date   CHOLHDL 6 12/01/2017   Lab Results  Component Value Date   HGBA1C 7.9 (H) 06/29/2018    IMPRESSION AND PLAN:  1) Morbid obesity: chronic bilat knee osteoarthritis pain prevents her from doing any meaningful exercise. Her diet has never consistently been in line with diabetic type diet. She is interested in getting a surgical consult to discuss possible bariatric surgery options. She requested Dr. Lucia Gaskins with [facility].  2) DM 2: control seems a bit improved since last visit, but her spot glucose checks are sometimes very difficult to use to guide any insulin adjustments. No changes made today. Check Hba1c. 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  3) HTN: The current medical regimen is effective;  continue present plan and medications. Lytes/cr today.  4) NASH: unfortunately, ultrasound late 2019 showed some progression to early cirrhotic changes. Check hepatic panel today. Again, wt loss encouraged.  An After Visit Summary was printed and given to the patient.  FOLLOW UP:  CPE 3 months  Signed:  Crissie Sickles, MD           10/15/2018

## 2018-10-16 ENCOUNTER — Telehealth: Payer: Self-pay | Admitting: Family Medicine

## 2018-10-16 NOTE — Telephone Encounter (Signed)
Pt's husband given results per notes of Dr Anitra Lauth on 10/16/18 Unable to document in result note due to result note not being routed to [facility].

## 2018-11-09 ENCOUNTER — Other Ambulatory Visit: Payer: Self-pay | Admitting: Family Medicine

## 2018-11-20 ENCOUNTER — Other Ambulatory Visit: Payer: Self-pay | Admitting: Family Medicine

## 2018-11-20 ENCOUNTER — Other Ambulatory Visit (INDEPENDENT_AMBULATORY_CARE_PROVIDER_SITE_OTHER): Payer: Self-pay | Admitting: Internal Medicine

## 2018-12-07 ENCOUNTER — Other Ambulatory Visit (INDEPENDENT_AMBULATORY_CARE_PROVIDER_SITE_OTHER): Payer: Self-pay | Admitting: *Deleted

## 2018-12-07 ENCOUNTER — Encounter (INDEPENDENT_AMBULATORY_CARE_PROVIDER_SITE_OTHER): Payer: Self-pay | Admitting: *Deleted

## 2018-12-07 DIAGNOSIS — K5190 Ulcerative colitis, unspecified, without complications: Secondary | ICD-10-CM

## 2018-12-07 DIAGNOSIS — D649 Anemia, unspecified: Secondary | ICD-10-CM

## 2018-12-08 ENCOUNTER — Other Ambulatory Visit: Payer: Self-pay | Admitting: Family Medicine

## 2018-12-08 ENCOUNTER — Encounter (INDEPENDENT_AMBULATORY_CARE_PROVIDER_SITE_OTHER): Payer: Self-pay | Admitting: *Deleted

## 2018-12-09 ENCOUNTER — Telehealth: Payer: Self-pay | Admitting: Obstetrics & Gynecology

## 2018-12-09 ENCOUNTER — Other Ambulatory Visit (HOSPITAL_COMMUNITY): Payer: Self-pay | Admitting: Obstetrics & Gynecology

## 2018-12-09 DIAGNOSIS — Z1231 Encounter for screening mammogram for malignant neoplasm of breast: Secondary | ICD-10-CM

## 2018-12-09 DIAGNOSIS — E2839 Other primary ovarian failure: Secondary | ICD-10-CM

## 2018-12-09 NOTE — Telephone Encounter (Signed)
Per review of Epic, last BMD 06/03/09, discussed last last AEX 01/27/18.   BMD order in Epic expired, new order placed.  Call to [facility] at [phone], BMD scheduled to follow [facility] on 12/31/18 at 1230. No calcium supplement 48 hrs prior. Bring list of medications.  Call returned to patient, notified of BMD appt and instrcutions. Patient verbalizes understanding.   Routing to provider for final review. Patient is agreeable to disposition. Will close encounter.

## 2018-12-09 NOTE — Telephone Encounter (Signed)
Patient has her MMG scheduled 12/31/18 at 11:30am at would like her BMD done the same day. She is having this done at [facility]. She was told our office had to contact [facility] and schedule the BMD.  She would like an appointment 12/31/18 at 1:00pm.

## 2018-12-11 ENCOUNTER — Telehealth: Payer: Self-pay | Admitting: *Deleted

## 2018-12-11 NOTE — Telephone Encounter (Signed)
   [facility] Pre-operative Risk Assessment    Request for surgical clearance:  1. What type of surgery is being performed?  BARIATRIC /SLEEVE SURGERY  2. When is this surgery scheduled? TBA  3. What type of clearance is required (medical clearance vs. Pharmacy clearance to hold med vs. Both)? BOTH  4. Are there any medications that need to be held prior to surgery and how long?Carlos  5. Practice name and name of physician performing surgery? [facility]; Dr Alphonsa Overall    6. What is your office phone number [phone]   7.   What is your office fax number [place]  8.   Anesthesia type (None, local, MAC, general) ? GENERAL   Raiford Simmonds 12/11/2018, 5:49 PM  _________________________________________________________________   (provider comments below)

## 2018-12-14 ENCOUNTER — Other Ambulatory Visit: Payer: Self-pay | Admitting: Surgery

## 2018-12-14 DIAGNOSIS — E1369 Other specified diabetes mellitus with other specified complication: Secondary | ICD-10-CM

## 2018-12-14 DIAGNOSIS — I10 Essential (primary) hypertension: Secondary | ICD-10-CM

## 2018-12-14 NOTE — Telephone Encounter (Signed)
Patient with diagnosis of Afib on Xarelto for anticoagulation.    Procedure: bariatric/sleeve surgery Date of procedure: TBA  CHADS2-VASc score of  4 (CHF, HTN, AGE, DM2, stroke/tia x 2, CAD, AGE, female)  CrCl 137 ml/min  Per office protocol, patient can hold Xarelto for 1-2 days prior to procedure.

## 2018-12-14 NOTE — Telephone Encounter (Signed)
Clinical pharmacist to address Xarelto

## 2018-12-14 NOTE — Telephone Encounter (Signed)
Left message for the patient to call back and speak to the on call preop APP

## 2018-12-15 NOTE — Telephone Encounter (Signed)
Follow up:    Patient returning call from yesterday about a clearance.please call patient back.

## 2018-12-16 NOTE — Telephone Encounter (Signed)
Patient returned called.  Given past medical history and time since last visit, based on ACC/AHA guidelines, [name] would be at acceptable risk for the planned procedure without further cardiovascular testing.   I will route this recommendation to the requesting party via Epic fax function and remove from pre-op pool.  Please call with questions.  [place], [place] 12/16/2018, 11:42 AM

## 2018-12-16 NOTE — Telephone Encounter (Signed)
Called and spoke with patient's husband. She will call us back.

## 2018-12-17 ENCOUNTER — Telehealth: Payer: Self-pay | Admitting: *Deleted

## 2018-12-17 NOTE — Telephone Encounter (Signed)
Received mailed in letter from pt requesting a letter/medical clearance from Dr. Anitra Lauth.   Letter placed on Dr. Idelle Leech desk for review.

## 2018-12-20 ENCOUNTER — Encounter: Payer: Self-pay | Admitting: Family Medicine

## 2018-12-21 ENCOUNTER — Telehealth: Payer: Self-pay

## 2018-12-21 NOTE — Telephone Encounter (Signed)
NOTES ON FILE 

## 2018-12-22 ENCOUNTER — Encounter: Payer: Self-pay | Admitting: Family Medicine

## 2018-12-22 NOTE — Telephone Encounter (Signed)
Letter printed, signed, and put on your desk.-thx

## 2018-12-22 NOTE — Telephone Encounter (Signed)
Patient was notified letter is ready. Requested that it was mailed. It has been placed up front to be mailed.

## 2018-12-23 ENCOUNTER — Telehealth: Payer: Self-pay

## 2018-12-23 ENCOUNTER — Other Ambulatory Visit: Payer: Self-pay | Admitting: Family Medicine

## 2018-12-23 NOTE — Telephone Encounter (Signed)
FAXED REFERRAL TO [facility]

## 2018-12-30 DIAGNOSIS — E611 Iron deficiency: Secondary | ICD-10-CM

## 2018-12-30 DIAGNOSIS — Z862 Personal history of diseases of the blood and blood-forming organs and certain disorders involving the immune mechanism: Secondary | ICD-10-CM | POA: Insufficient documentation

## 2018-12-30 DIAGNOSIS — D509 Iron deficiency anemia, unspecified: Secondary | ICD-10-CM

## 2018-12-30 HISTORY — DX: Iron deficiency: E61.1

## 2018-12-30 HISTORY — DX: Iron deficiency anemia, unspecified: D50.9

## 2018-12-30 HISTORY — DX: Personal history of diseases of the blood and blood-forming organs and certain disorders involving the immune mechanism: Z86.2

## 2018-12-31 ENCOUNTER — Other Ambulatory Visit (HOSPITAL_COMMUNITY): Payer: Medicare Other

## 2018-12-31 ENCOUNTER — Ambulatory Visit (HOSPITAL_COMMUNITY): Payer: Medicare Other

## 2019-01-06 ENCOUNTER — Ambulatory Visit: Payer: Medicare Other | Admitting: Dietician

## 2019-01-26 ENCOUNTER — Encounter (INDEPENDENT_AMBULATORY_CARE_PROVIDER_SITE_OTHER): Payer: Self-pay | Admitting: Internal Medicine

## 2019-01-26 ENCOUNTER — Other Ambulatory Visit: Payer: Self-pay

## 2019-01-26 ENCOUNTER — Ambulatory Visit (INDEPENDENT_AMBULATORY_CARE_PROVIDER_SITE_OTHER): Payer: Medicare Other | Admitting: Internal Medicine

## 2019-01-26 VITALS — BP 129/75 | HR 74 | Temp 98.3°F | Resp 18 | Ht 61.0 in | Wt 229.5 lb

## 2019-01-26 DIAGNOSIS — K580 Irritable bowel syndrome with diarrhea: Secondary | ICD-10-CM | POA: Diagnosis not present

## 2019-01-26 DIAGNOSIS — D649 Anemia, unspecified: Secondary | ICD-10-CM

## 2019-01-26 DIAGNOSIS — K5190 Ulcerative colitis, unspecified, without complications: Secondary | ICD-10-CM

## 2019-01-26 NOTE — Patient Instructions (Signed)
Physician will call with results of blood test when completed.

## 2019-01-26 NOTE — Progress Notes (Signed)
Presenting complaint;  Follow-up for ulcerative colitis and diarrhea.  Database and subjective:  Patient is 73 year old Caucasian female who has a history of ulcerative colitis, chronic GERD, fatty liver as well as diarrhea felt to be due to IBS who returns for scheduled visit.  She was last seen on 07/28/2018. Patient has seen Dr. Alphonsa Overall for bariatric surgery.  She has an appointment to see a dietitian and then see a psychologist but these appointments have been postponed because of ongoing COVID-19 pandemic. Patient states she is doing well.  Last infliximab infusion was on 01/21/2019. She says heartburn is well controlled with PPI.  She generally has 2 stools per day.  She can have as many as 7 stools per day.  On her best days she has 1 stool per day.  Most of her stools are loose to semi-formed and occasionally she has formed stool.  However she denies melena or rectal bleeding.  She also denies abdominal pain. Dr. Williemae Natter is now managing her diabetes mellitus.   Current Medications: Outpatient Encounter Medications as of 01/26/2019  Medication Sig  . citalopram (CELEXA) 20 MG tablet TAKE 1 TABLET BY MOUTH EVERY DAY  . dicyclomine (BENTYL) 10 MG capsule TAKE 1 CAPSULE (10 MG TOTAL) BY MOUTH 3 (THREE) TIMES DAILY BEFORE MEALS.  Marland Kitchen esomeprazole (NEXIUM) 20 MG capsule Take 20 mg by mouth daily before breakfast.   . furosemide (LASIX) 40 MG tablet TAKE 1 TABLET BY MOUTH EVERY DAY  . gabapentin (NEURONTIN) 300 MG capsule 1 cap po bid (to be taken with a 600 mg gabapentin tab at each dose)  . gabapentin (NEURONTIN) 600 MG tablet Take 1 tablet (600 mg total) by mouth 2 (two) times daily. (each dose is to be taken with a 300 mg gabapentin pill  . glucose blood (ONE TOUCH ULTRA TEST) test strip Use as instructed to check sugar 6 times daily  . inFLIXimab (REMICADE) 100 MG injection Inject into the vein every 8 (eight) weeks.   . Insulin Pen Needle (B-D UF III MINI PEN NEEDLES) 31G X 5 MM MISC  USE TO INJECT INSULINS EQUAL TO 6 TIMES DAILY.  Marland Kitchen insulin regular human CONCENTRATED (HUMULIN R) 500 UNIT/ML kwikpen INJECT 112U SUBCUTANEOUSLY AT BREAKFAST, 125U AT "LUPPER", AND 115U AT EVENING MEAL. (Patient taking differently: INJECT 120U SUBCUTANEOUSLY AT BREAKFAST, 130U AT "LUPPER", AND 120U AT EVENING MEAL. May take additional 30 U at bedtime if glucose over 200.)  . KLOR-CON M10 10 MEQ tablet TAKE 2 TABLETS (20 MEQ TOTAL) BY MOUTH DAILY. -- OFFICE VISIT NEEDED FOR FURTHER REFILLS  . lactase (LACTAID) 3000 UNITS tablet Take 3,000 Units by mouth as needed (when eating foods containing dairy).   . Lancets (ONETOUCH DELICA PLUS [id number]) MISC USE AS DIRECTED TO CHECK BLOOD SUGAR UP TO 6 TIMES PER DAY. DX: E11.09  . loperamide (IMODIUM) 2 MG capsule Take 1 capsule (2 mg total) by mouth daily before breakfast.  . meclizine (ANTIVERT) 25 MG tablet Take 1 tablet (25 mg total) by mouth 3 (three) times daily as needed for dizziness or nausea.  . metoprolol succinate (TOPROL-XL) 100 MG 24 hr tablet TAKE 1 TABLET BY MOUTH 2 TIMES DAILY. TAKE WITH OR IMMEDIATELY FOLLOWING A MEAL  . rivaroxaban (XARELTO) 20 MG TABS tablet Take 1 tablet (20 mg total) by mouth daily with supper.  . TRULICITY 1.5 HY/8.6VH SOPN INJECT UNDER SKIN 1.5 MG WEEKLY UNDER SKIN.  . vitamin B-12 (CYANOCOBALAMIN) 1000 MCG tablet Take 5,000 mcg by mouth daily.   . [  DISCONTINUED] amitriptyline (ELAVIL) 25 MG tablet Take 1 tablet (25 mg total) by mouth at bedtime. start with 1/2 tab at night for 1 week then increase to 1 whole tab.  . [DISCONTINUED] bromocriptine (PARLODEL) 2.5 MG tablet Take 2.5 mg by mouth 2 (two) times daily.    . [DISCONTINUED] potassium chloride (K-DUR) 10 MEQ tablet Take 2 tablets (20 mEq total) by mouth daily.   No facility-administered encounter medications on file as of 01/26/2019.      Objective: Blood pressure 129/75, pulse 74, temperature 98.3 F (36.8 C), temperature source Oral, resp. rate 18, height 5'  1" (1.549 m), weight 229 lb 8 oz (104.1 kg). Patient is alert and in no acute distress. Conjunctiva is pink. Sclera is nonicteric Oropharyngeal mucosa is normal. No neck masses or thyromegaly noted. Cardiac exam with regular rhythm normal S1 and S2. No murmur or gallop noted. Lungs are clear to auscultation. Abdomen is full but soft and nontender with organomegaly or masses. No LE edema or clubbing noted.  Labs/studies Results:   CBC Latest Ref Rng & Units 11/13/2017 01/10/2017 12/18/2016  WBC 4.0 - 10.5 K/uL 5.7 5.2 7.1  Hemoglobin 12.0 - 15.0 g/dL 12.6 12.4 13.7  Hematocrit 36.0 - 46.0 % 37.7 38.1 40.8  Platelets 150.0 - 400.0 K/uL 167.0 186 187.0    CMP Latest Ref Rng & Units 10/15/2018 06/29/2018 03/27/2018  Glucose 70 - 99 mg/dL 137(H) 164(H) 169(H)  BUN 6 - 23 mg/dL 10 14 10   Creatinine 0.40 - 1.20 mg/dL 0.66 0.65 0.62  Sodium 135 - 145 mEq/L 140 137 137  Potassium 3.5 - 5.1 mEq/L 3.7 4.0 4.3  Chloride 96 - 112 mEq/L 104 100 101  CO2 19 - 32 mEq/L 30 29 28   Calcium 8.4 - 10.5 mg/dL 8.7 9.1 9.0  Total Protein 6.0 - 8.3 g/dL 7.0 7.1 7.0  Total Bilirubin 0.2 - 1.2 mg/dL 0.6 0.7 0.5  Alkaline Phos 39 - 117 U/L 74 85 85  AST 0 - 37 U/L 74(H) 124(H) 172(H)  ALT 0 - 35 U/L 59(H) 91(H) 139(H)    Hepatic Function Latest Ref Rng & Units 10/15/2018 06/29/2018 03/27/2018  Total Protein 6.0 - 8.3 g/dL 7.0 7.1 7.0  Albumin 3.5 - 5.2 g/dL 3.5 3.5 3.7  AST 0 - 37 U/L 74(H) 124(H) 172(H)  ALT 0 - 35 U/L 59(H) 91(H) 139(H)  Alk Phosphatase 39 - 117 U/L 74 85 85  Total Bilirubin 0.2 - 1.2 mg/dL 0.6 0.7 0.5  Bilirubin, Direct 0.0 - 0.3 mg/dL - - -    Lab data from 01/21/2019  WBC 5.1, H&H 10.3 and 31.0, MCV 83, platelet count 171K.  Differential revealed neutrophils to be 35%, lymphocytes 52%, monocytes 9%, eosinophils 3% and basophils 1%.  TSH 1.65.  Assessment:  #1.  Chronic ulcerative colitis.  Last colonoscopy was in November 2016.  She has both clinical and endoscopic remission.  She  is on infliximab.  Next surveillance colonoscopy would be in November 2021.  #2.  Chronic diarrhea.  She is doing well with low-dose dicyclomine and loperamide.  Diarrhea is felt to be due to IBS and are diabetic diarrhea.  Diarrhea is not due to ulcerative colitis which remains in remission.  #3.  Anemia.  This is a new problem.  Review of her CBC reveals that her MCV has been gradually dropping although it is still within normal limits.  No history of GI bleed.  She possibly is developing iron deficiency anemia given that she has  been on chronic PPI therapy.  Differential is pertinent for lymphocytosis which he has had since she has been on biologic.  She has seen hematologist/oncologist in the past.  #4.  History of elevated transaminases secondary to fatty liver.  She has been worked up in the past.  I would very much like for her to have liver biopsy at the time of bariatric surgery to determine if she has fibrosis.   Plan:  Patient will continue infliximab infusion every 8 weeks, dicyclomine, loperamide and Nexium OTC as before. She will go to the lab for serum iron TIBC ferritin and serum B12 level. Office visit in 6 months.

## 2019-01-27 ENCOUNTER — Encounter: Payer: Medicare Other | Admitting: Family Medicine

## 2019-01-27 LAB — IRON, TOTAL/TOTAL IRON BINDING CAP
%SAT: 10 % (calc) — ABNORMAL LOW (ref 16–45)
Iron: 34 ug/dL — ABNORMAL LOW (ref 45–160)
TIBC: 345 mcg/dL (calc) (ref 250–450)

## 2019-01-27 LAB — VITAMIN B12: Vitamin B-12: >2000 pg/mL — ABNORMAL HIGH (ref 200–1100)

## 2019-01-27 LAB — FERRITIN: Ferritin: 11 ng/mL — ABNORMAL LOW (ref 16–288)

## 2019-01-28 ENCOUNTER — Other Ambulatory Visit (INDEPENDENT_AMBULATORY_CARE_PROVIDER_SITE_OTHER): Payer: Self-pay | Admitting: *Deleted

## 2019-01-28 ENCOUNTER — Encounter: Payer: Self-pay | Admitting: Family Medicine

## 2019-01-28 ENCOUNTER — Other Ambulatory Visit (INDEPENDENT_AMBULATORY_CARE_PROVIDER_SITE_OTHER): Payer: Self-pay | Admitting: Internal Medicine

## 2019-01-28 ENCOUNTER — Encounter (INDEPENDENT_AMBULATORY_CARE_PROVIDER_SITE_OTHER): Payer: Self-pay | Admitting: *Deleted

## 2019-01-28 DIAGNOSIS — D508 Other iron deficiency anemias: Secondary | ICD-10-CM

## 2019-01-28 MED ORDER — FLINTSTONES PLUS IRON PO CHEW: 1.0000 | CHEWABLE_TABLET | Freq: Two times a day (BID) | ORAL | Status: DC

## 2019-02-04 ENCOUNTER — Ambulatory Visit: Payer: Medicare Other | Admitting: Family Medicine

## 2019-02-12 ENCOUNTER — Other Ambulatory Visit: Payer: Self-pay

## 2019-02-12 MED ORDER — GABAPENTIN 600 MG PO TABS: 600.0000 mg | ORAL_TABLET | Freq: Two times a day (BID) | ORAL | 1 refills | Status: DC

## 2019-02-12 NOTE — Telephone Encounter (Signed)
RF request for Gabapentin LOV: 10/15/18 Next ov: 04/21/19 Last written: 08/25/18 (180,1)  Please advise, thanks. Medication pending

## 2019-02-24 ENCOUNTER — Encounter: Payer: Self-pay | Admitting: Skilled Nursing Facility1

## 2019-02-24 ENCOUNTER — Encounter: Payer: Medicare Other | Attending: Surgery | Admitting: Skilled Nursing Facility1

## 2019-02-24 ENCOUNTER — Other Ambulatory Visit: Payer: Self-pay

## 2019-02-24 DIAGNOSIS — Z713 Dietary counseling and surveillance: Secondary | ICD-10-CM | POA: Diagnosis not present

## 2019-02-24 DIAGNOSIS — E669 Obesity, unspecified: Secondary | ICD-10-CM | POA: Insufficient documentation

## 2019-02-24 DIAGNOSIS — E119 Type 2 diabetes mellitus without complications: Secondary | ICD-10-CM | POA: Diagnosis not present

## 2019-02-24 DIAGNOSIS — I10 Essential (primary) hypertension: Secondary | ICD-10-CM | POA: Diagnosis not present

## 2019-02-24 NOTE — Progress Notes (Signed)
Pre-Op Assessment Visit:  Pre-Operative Sleeve Gastrectomy Surgery  Medical Nutrition Therapy:  Appt start time: 3:20  End time:  4:20  I connected with pt on 02/24/2019 at  3:20 AM/PM EDT by telephone at home and verified that I am speaking with the correct person using two identifiers.   I discussed the limitations, risks, security and privacy concerns of performing an evaluation and management service by telephone and the availability of in person appointments. I also discussed with the patient that there may be a patient responsible charge related to this service. The patient expressed understanding and agreed to proceed.  Patient was seen on 02/24/2019 for Pre-Operative Nutrition Assessment. Assessment and letter of approval faxed to [facility] Surgery Bariatric Surgery Program coordinator on 02/24/2019.    Referral Stated SWL Appointments Required: 6  Proposed Surgery Type: Sleeve Gastrectomy   Pt expectation of surgery:   Pt expectation of dietitian:    NUTRITION ASSESSMENT   Anthropometrics  Start weight at [facility]: phone visit lbs Today's weight:  lbs BMI:  kg/m2     Psychosocial/Lifestyle Dx: DM  Pt states she checks her blood sugar 3 times a day: 150. Pt states she feels the best when her blood sugars are in the 150 range and around 100 she feels veery weak and tired. Pt states she is allergic to raw shrimp but if she cooks it she is okay.   To discuss next time: the food label   Medications: Long acting insulin trulicity   Labs: Vitamin B12 2000 (pt states she took her B12 before she got her blood drawn) Iron 34 Ferritin 11 A1C 7.1 (pt stated)   Notable Signs/Symptoms Low blood sugars 3 times a month-unsure of cause (50-55)-typically first thing in the morning  GERD (acid relfux every morning) Sleep Apnea   24-Hr Dietary Recall First Meal: peanut butter sandwich on cinnamon raisin bread Snack: peanut butter crackers Second Meal: skipped Snack:  oatmeal cookie Third Meal: green beans and mashed potato and chicken or sandwich Snack: popcorn or peanut butter crackers Beverages: diet caffeine free coke, water    Physical Activity  ADL's   Estimated Energy Needs Calories: 1400 Carbohydrate: 158 Protein: 105 Fat: 39   NUTRITION DIAGNOSIS  Overweight/obesity ([place]-3.3) related to past poor dietary habits and physical inactivity as evidenced by patient w/ planned Sleeve surgery following dietary guidelines for continued weight loss.    NUTRITION INTERVENTION  Nutrition counseling (C-1) and education (E-2) to facilitate bariatric surgery goals.   Handouts given during visit include:  . Pre-Op Goals . Bariatric Surgery Protein Shakes . Vitamin and Mineral Options    During the appointment today the following Pre-Op Goals were reviewed with the patient: . Log your food and beverage via an app or pen and paper: and blood sugar readings: test fasting and 2 hours after each meal . Make healthy food choices . Begin to limit portion sizes . Limited concentrated sugars and fried foods . Keep fat/sugar in the single digits per serving on              food labels . Practice CHEWING your food  (aim for 30 chews per bite or until applesauce consistency) . Practice not drinking 15 minutes before, during, and 30 minutes after each meal/snack . Avoid all carbonated beverages   . Avoid/limit caffeinated beverages  . Avoid all sugar-sweetened beverages . Consume 3 meals per day; eat every 3-5 hours . Make a list of non-food related activities . Aim for 64-100 ounces  of FLUID daily  . Aim for at least 60-80 grams of PROTEIN daily . Look for a liquid protein source that contain ?15 g protein and ?5 g carbohydrate  (ex: shakes, drinks, shots) . Get back to using your bike: 30 minutes at a time 4 days a week: eat around .5-1 hours before getting on it   Change readiness: Contemplating   Demonstrated degree of understanding via: [place] intake, weekly physical activity, body weight, and pre-op goals reached.    Next Steps  Patient is to call [facility] (once surgery date is scheduled) to be scheduled for Pre-Op Class, which must be at least 2 weeks prior to surgery date or back follow up visit in 1 month

## 2019-03-01 ENCOUNTER — Other Ambulatory Visit: Payer: Self-pay | Admitting: Family Medicine

## 2019-03-24 ENCOUNTER — Ambulatory Visit: Payer: Medicare Other | Admitting: Skilled Nursing Facility1

## 2019-03-25 ENCOUNTER — Other Ambulatory Visit: Payer: Self-pay | Admitting: Cardiology

## 2019-03-25 NOTE — Telephone Encounter (Signed)
Pt is a 52yof requesting xarelto 77m, wt 104.1kg, scr 0.66(10/15/18), ccr1247mmin, lov w/crenshaw(07/20/18) DXAFIB

## 2019-04-15 ENCOUNTER — Other Ambulatory Visit: Payer: Self-pay | Admitting: Internal Medicine

## 2019-04-16 ENCOUNTER — Ambulatory Visit (INDEPENDENT_AMBULATORY_CARE_PROVIDER_SITE_OTHER): Payer: Medicare Other | Admitting: Obstetrics & Gynecology

## 2019-04-16 ENCOUNTER — Other Ambulatory Visit: Payer: Self-pay

## 2019-04-16 ENCOUNTER — Encounter: Payer: Self-pay | Admitting: Obstetrics & Gynecology

## 2019-04-16 VITALS — BP 122/64 | HR 80 | Temp 97.7°F | Ht 60.5 in | Wt 231.0 lb

## 2019-04-16 DIAGNOSIS — Z23 Encounter for immunization: Secondary | ICD-10-CM | POA: Diagnosis not present

## 2019-04-16 DIAGNOSIS — Z01419 Encounter for gynecological examination (general) (routine) without abnormal findings: Secondary | ICD-10-CM | POA: Diagnosis not present

## 2019-04-16 DIAGNOSIS — K648 Other hemorrhoids: Secondary | ICD-10-CM | POA: Diagnosis not present

## 2019-04-16 MED ORDER — HYDROCORTISONE (PERIANAL) 2.5 % EX CREA: 1.0000 "application " | TOPICAL_CREAM | Freq: Two times a day (BID) | CUTANEOUS | 2 refills | Status: DC

## 2019-04-16 NOTE — Progress Notes (Signed)
73 y.o. G1P1 Married White or Caucasian female here for annual exam.  Having some issues more recently with ulceration colitis.  Followed by Dr. Laural [name].  Denies vaginal bleeding.    She is going to have sleeve gastrectomy but was rescheduled due to Covid.    Last HbA1C was 7.1.  Would like to be seen by Dr. Marcello Moores due to hemorrhoids.  Was referred in the past but cancelled appointment.    PCP:  [place].  Has appt scheduled next week.  Will have CBC repeated next week.    No LMP recorded. Patient has had a hysterectomy.          Sexually active: No.  The current method of family planning is status post hysterectomy.    Exercising: Yes.    some Smoker:  no  Health Maintenance: Pap:  2012 Normal  History of abnormal Pap:  no MMG:  09/17/17 BIRADS1:Neg.  Appt was cancelled and she never got a call back.   Colonoscopy:  2016 Dr. Laural [name]. F/u 5 years  BMD:   06/23/09.  This is due.   TDaP:  2010 Pneumonia vaccine(s):  Done  Shingrix:   No Hep C testing: 10/25/16 Neg  Screening Labs: PCP   reports that she has never smoked. She has never used smokeless tobacco. She reports current alcohol use. She reports that she does not use drugs.  Past Medical History:  Diagnosis Date  . Anxiety   . 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  Marland Kitchen Dietary iron deficiency without anemia 12/2018   Secondary to chronic/long term PPI therapy.  flintstone vitamin bid started end of april 2020.  Marland Kitchen Fibromyalgia   . GERD   . History of thrombocytopenia 12/2011  . Hyperlipidemia    Intolerant of statins  . HYPERTENSION   . IBS (irritable bowel syndrome)    -D.  Good response to bentyl and imodium as of 06/2018 GI f/u.  Marland Kitchen IDDM (insulin dependent diabetes mellitus) ([place])    with DPN (managed by Dr. Cruzita Lederer but then in 2018 pt preferred to have me manage for her convenience)  . Limited mobility     Requires a walker for arthritic pain, widespread musculoskeletal pain, and neuropathic pain.  . Morbid obesity ([place])    As of 11/2018, pt considering sleeve gastectomy vs bipass as of eval by Dr. Lucia Gaskins: next steps are labs/xrays, psych and nutrit consults, and cardiology clearance.  . Nonalcoholic steatohepatitis    Viral Hep screens NEG.  CT 2015.  Transaminasemia.  U/S 07/2018 showed early changes of cirrhosis.  . OSA (obstructive sleep apnea) 09/14/2015   sleep study 09/07/15: severe obstructive sleep apnea with an AHI of 72 and SaO2 low of 75%.>refer to sleep med for eval and tx options  . Osteoarthritis    hips, shoulders, knees  . PAF (paroxysmal atrial fibrillation) ([place])    One documented episode (after getting EGD 2016).  Was on amiodarone x 3 mo.  Rate control with metoprolol + anticoag with xarelto.   . Small fiber neuropathy    Due to DM.  Symmetric hands and feet tingling/numbness.  Marland Kitchen Ulcerative colitis ([place])    Remicade infusion Q 8 weeks: in clinical and endoscopic remission as of 12/2018 GI f/u    Past Surgical History:  Procedure Laterality Date  . ABDOMINAL HYSTERECTOMY  1980   Paps no longer indicated.  Marland Kitchen BACTERIAL OVERGROWTH  TEST N/A 07/13/2015   Procedure: BACTERIAL OVERGROWTH TEST;  Surgeon: Rogene Houston, MD;  [facility];  Service: Endoscopy;  Laterality: N/A;  730    . BILATERAL SALPINGOOPHORECTOMY  02/10/2001  . BREAST REDUCTION SURGERY  1994   bilat  . CARDIOVASCULAR STRESS TEST  07/2010   Lexiscan myoview: normal  . CARPAL TUNNEL RELEASE Right 1996  . CARPAL TUNNEL RELEASE Left 03/21/2003  . CARPAL TUNNEL RELEASE Right 05/04/2013   Procedure: CARPAL TUNNEL RELEASE;  Surgeon: Cammie Sickle., MD;  Location: [place];  Service: Orthopedics;  Laterality: Right;  . CARPAL TUNNEL RELEASE Left 09/21/2013   Procedure: LEFT CARPAL TUNNEL RELEASE;  Surgeon: Cammie Sickle., MD;  Location: [place];  Service:  Orthopedics;  Laterality: Left;  . CHOLECYSTECTOMY    . COLONOSCOPY WITH PROPOFOL N/A 08/04/2015   Colitis in remission.  No polyps.  Procedure: COLONOSCOPY WITH PROPOFOL;  Surgeon: Rogene Houston, MD;  Location: [facility];  Service: Endoscopy;  Laterality: N/A;  cecum time in  0820   time out  0827    total time 7 minutes  . ESOPHAGEAL DILATION N/A 08/04/2015   Procedure: ESOPHAGEAL DILATION;  Surgeon: Rogene Houston, MD;  Location: [facility];  Service: Endoscopy;  Laterality: N/A;  [place] 57, no mucousal disruption  . ESOPHAGOGASTRODUODENOSCOPY (EGD) WITH ESOPHAGEAL DILATION  12/02/2005  . ESOPHAGOGASTRODUODENOSCOPY (EGD) WITH PROPOFOL N/A 08/04/2015   Procedure: ESOPHAGOGASTRODUODENOSCOPY (EGD) WITH PROPOFOL;  Surgeon: Rogene Houston, MD;  Location: [facility];  Service: Endoscopy;  Laterality: N/A;  procedure 1  . FLEXIBLE SIGMOIDOSCOPY  01/17/2012   Procedure: FLEXIBLE SIGMOIDOSCOPY;  Surgeon: Rogene Houston, MD;  [facility];  Service: Endoscopy;  Laterality: N/A;  . HEMILAMINOTOMY LUMBAR SPINE Bilateral 09/07/1999   L4-5  . KNEE ARTHROSCOPY Right 01/1999; 10/2000  . LYSIS OF ADHESION  02/10/2001  . [place]; 09/12/2006  . TARSAL TUNNEL RELEASE  2002  . TRANSTHORACIC ECHOCARDIOGRAM  08/04/2015   EF 60-65%, normal wall motion, mild LVH, mild LA dilation, grd I DD.  Marland Kitchen TUMOR EXCISION Left 03/21/2003   dorsal 1st web space (hand)  . URETEROLYSIS Right 02/10/2001    Current Outpatient Medications  Medication Sig Dispense Refill  . citalopram (CELEXA) 20 MG tablet TAKE 1 TABLET BY MOUTH EVERY DAY 90 tablet 1  . dicyclomine (BENTYL) 10 MG capsule TAKE 1 CAPSULE (10 MG TOTAL) BY MOUTH 3 (THREE) TIMES DAILY BEFORE MEALS. 270 capsule 3  . esomeprazole (NEXIUM) 20 MG capsule Take 20 mg by mouth daily before breakfast.     . furosemide (LASIX) 40 MG tablet TAKE 1 TABLET BY MOUTH EVERY DAY 90 tablet 1  . gabapentin (NEURONTIN) 300 MG capsule 1 cap po bid (to be taken with a 600 mg  gabapentin tab at each dose) 180 capsule 1  . gabapentin (NEURONTIN) 600 MG tablet Take 1 tablet (600 mg total) by mouth 2 (two) times daily. (each dose is to be taken with a 300 mg gabapentin pill 180 tablet 1  . glucose blood (ONE TOUCH ULTRA TEST) test strip Use as instructed to check sugar 6 times daily 400 each 5  . inFLIXimab (REMICADE) 100 MG injection Inject into the vein every 8 (eight) weeks.     . Insulin Pen Needle (B-D UF III MINI PEN NEEDLES) 31G X 5 MM MISC USE TO INJECT INSULINS EQUAL TO 6 TIMES DAILY. 600 each 5  . insulin regular human  CONCENTRATED (HUMULIN R) 500 UNIT/ML kwikpen INJECT 112U SUBCUTANEOUSLY AT BREAKFAST, 125U AT "LUPPER", AND 115U AT EVENING MEAL. (Patient taking differently: INJECT 120U SUBCUTANEOUSLY AT BREAKFAST, 130U AT "LUPPER", AND 120U AT EVENING MEAL. May take additional 30 U at bedtime if glucose over 200.) 64 mL 1  . KLOR-CON M10 10 MEQ tablet TAKE 2 TABLETS (20 MEQ TOTAL) BY MOUTH DAILY. -- OFFICE VISIT NEEDED FOR FURTHER REFILLS 180 tablet 0  . lactase (LACTAID) 3000 UNITS tablet Take 3,000 Units by mouth as needed (when eating foods containing dairy).     . Lancets (ONETOUCH DELICA PLUS [id number]) MISC USE AS DIRECTED TO CHECK BLOOD SUGAR UP TO 6 TIMES PER DAY. DX: E11.09 500 each 3  . loperamide (IMODIUM) 2 MG capsule Take 1 capsule (2 mg total) by mouth daily before breakfast. 30 capsule   . meclizine (ANTIVERT) 25 MG tablet Take 1 tablet (25 mg total) by mouth 3 (three) times daily as needed for dizziness or nausea. 90 tablet 0  . metoprolol succinate (TOPROL-XL) 100 MG 24 hr tablet TAKE 1 TABLET BY MOUTH 2 TIMES DAILY. TAKE WITH OR IMMEDIATELY FOLLOWING A MEAL 180 tablet 3  . Pediatric Multivitamins-Iron (FLINTSTONES PLUS IRON) chewable tablet Chew 1 tablet by mouth 2 (two) times daily.    . TRULICITY 1.5 OR/5.6FB SOPN INJECT UNDER SKIN 1.5 MG WEEKLY UNDER SKIN. 12 pen 3  . vitamin B-12 (CYANOCOBALAMIN) 1000 MCG tablet Take 5,000 mcg by mouth daily.      Alveda Reasons 20 MG TABS tablet TAKE 1 TABLET (20 MG TOTAL) BY MOUTH DAILY WITH SUPPER. 90 tablet 1   No current facility-administered medications for this visit.     Family History  Problem Relation Age of Onset  . Diabetes Mother   . Hypertension Mother   . Heart attack Father        Mid 49's  . Heart disease Father   . Lung disease Father        spot on lung; had lung surgery  . Alcohol abuse Other   . Hypertension Son   . Diabetes Son   . Emphysema Maternal Grandfather   . Asthma Maternal Grandfather   . Colon cancer Paternal Grandfather   . Stomach cancer Paternal Grandmother   . Neuropathy Neg Hx     Review of Systems  All other systems reviewed and are negative.   Exam:   BP 122/64   Pulse 80   Temp 97.7 F (36.5 C) (Temporal)   Ht 5' 0.5" (1.537 m)   Wt 231 lb (104.8 kg)   BMI 44.37 kg/m   Height: 5' 0.5" (153.7 cm)  Ht Readings from Last 3 Encounters:  04/16/19 5' 0.5" (1.537 m)  02/24/19 5' 2"  (1.575 m)  01/26/19 5' 1"  (1.549 m)    General appearance: alert, cooperative and appears stated age Head: Normocephalic, without obvious abnormality, atraumatic Neck: no adenopathy, supple, symmetrical, trachea midline and thyroid normal to inspection and palpation Lungs: clear to auscultation bilaterally Breasts: normal appearance, no masses or tenderness Heart: regular rate and rhythm Abdomen: soft, non-tender; bowel sounds normal; no masses,  no organomegaly Extremities: extremities normal, atraumatic, no cyanosis or edema Skin: Skin color, texture, turgor normal. No rashes or lesions Lymph nodes: Cervical, supraclavicular, and axillary nodes normal. No abnormal inguinal nodes palpated Neurologic: Grossly normal  Pelvic: External genitalia:  no lesions              Urethra:  normal appearing urethra with no masses, tenderness or  lesions              Bartholins and Skenes: normal                 Vagina: normal appearing vagina with normal color and  discharge, no lesions              Cervix: absent              Pap taken: No. Bimanual Exam:  Uterus:  uterus absent              Adnexa: no mass, fullness, tenderness               Rectovaginal: Confirms               Anus:  normal sphincter tone, hemorrhoids  Chaperone was present for exam.  A:  Well Woman with normal exam PMP, no HRT H/o TAH, then BSO Hypertension Elevated lipids Ulcerative colitis, followed by Dr. Laural [name] Diabetes BMD 42  P:   Mammogram guidelines reviewed.  Desires to have it done this year. BMD will be scheduled with MMG as well pap smear not indicated Updated Tdap today U/A will be run today Rx for Proctosol 2.5% sent to pharmacy on file Return annually or prn

## 2019-04-19 ENCOUNTER — Telehealth: Payer: Self-pay | Admitting: *Deleted

## 2019-04-19 NOTE — Telephone Encounter (Signed)
-----   Message from Megan Salon, MD sent at 04/16/2019  2:17 PM EDT ----- Regarding: BMD [name], Can you see if a BMD can be scheduled the same time as her MMG?  Thanks.  Vinnie Level

## 2019-04-19 NOTE — Telephone Encounter (Signed)
-----   Message from Megan Salon, MD sent at 04/16/2019  2:15 PM EDT ----- Regarding: MMG Sharee Pimple, Pt needs MMG scheduled at [facility].  Can you take care of this?  Afternoon is better.  Thanks. Vinnie Level

## 2019-04-19 NOTE — Telephone Encounter (Signed)
Spoke with [name] at [facility] scheduling. MMG scheduled on 7/30 at 2:45pm, BMD to follow at 3pm at [place] at 2:30pm, stop all calcium supplement 24-48 hrs prior to appt, bring list of medications.

## 2019-04-19 NOTE — Telephone Encounter (Signed)
Spoke with patient, advised of MMG and BMD appointment and instrcutions, patient verbalizes understanding and is agreeable to dates and times.  Routing to provider for final review. Patient is agreeable to disposition. Will close encounter.

## 2019-04-21 ENCOUNTER — Encounter: Payer: Self-pay | Admitting: Family Medicine

## 2019-04-21 ENCOUNTER — Other Ambulatory Visit: Payer: Self-pay

## 2019-04-21 ENCOUNTER — Ambulatory Visit: Payer: Medicare Other | Admitting: Family Medicine

## 2019-04-21 VITALS — BP 114/68 | HR 78 | Temp 97.9°F | Resp 16 | Ht 60.5 in | Wt 232.6 lb

## 2019-04-21 DIAGNOSIS — E118 Type 2 diabetes mellitus with unspecified complications: Secondary | ICD-10-CM

## 2019-04-21 DIAGNOSIS — D509 Iron deficiency anemia, unspecified: Secondary | ICD-10-CM | POA: Diagnosis not present

## 2019-04-21 DIAGNOSIS — E7800 Pure hypercholesterolemia, unspecified: Secondary | ICD-10-CM | POA: Diagnosis not present

## 2019-04-21 DIAGNOSIS — I10 Essential (primary) hypertension: Secondary | ICD-10-CM

## 2019-04-21 NOTE — Progress Notes (Signed)
OFFICE VISIT  04/21/2019   CC:  Chief Complaint  Patient presents with  . Follow-up    RCI, pt is not fasting   HPI:    Patient is a 73 y.o. Caucasian female who presents for 6 mo f/u DM 2, HTN, morbid obesity, NASH.   Obesity: no formal exercise.  Up and down steps at house.  No specific diet, trying not to overeat. Last visit I referred her to Dr. Lucia Gaskins with gen surg at her request b/c she wanted to discuss possible bariatric surgery. She has been back and forth on her decision about it. Her GYN raved about it at a recent visit. She has now made decision to get the gastric sleeve procedure, but has to see nutritionist first.  NASH: Dr. Laural [name] suggest liver bx at the time of gastric sleeve surgery. He'll talk to Dr. Lucia Gaskins about this.  Has A-fib, managed by Dr. Stanford Breed. Ulcerative colitis managed by Dr. Laural [name]: doing fine with this. Iron def anemia recently dx'd by Dr. Luisa Dago taking flintstone vitamin bid for about 3 mo.    DM: U500 at mealtime (120-130-120).  She takes 30 U at bedtime if gluc >200. Trulicity weekly. 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 Glucoses--> fastings avg low 100s, some hypoglycemia noted.  Still mostly 200s/300s 2H PPlunch and supper and hs.  HTN: consistently < 130/80 at home.  ROS: no CP, no SOB, no wheezing, no cough, no dizziness, no HAs, no rashes, no melena/hematochezia.  No polyuria or polydipsia.     Past Medical History:  Diagnosis Date  . Anxiety   . 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 Cirrhosis, nonalcoholic ([place]) 38/4665   NASH--> early cirrhotic changes on ultrasound 07/2018.  Marland Kitchen Dietary iron deficiency without anemia 12/2018   Secondary to chronic/long term PPI therapy.  flintstone vitamin bid started end of april 2020.  Marland Kitchen Fibromyalgia   . GERD   . History of thrombocytopenia 12/2011  . Hyperlipidemia    Intolerant of statins  . HYPERTENSION   . IBS (irritable bowel syndrome)    -D.  Good  response to bentyl and imodium as of 06/2018 GI f/u.  Marland Kitchen IDDM (insulin dependent diabetes mellitus) ([place])    with DPN (managed by Dr. Cruzita Lederer but then in 2018 pt preferred to have me manage for her convenience)  . Limited mobility    Requires a walker for arthritic pain, widespread musculoskeletal pain, and neuropathic pain.  . Morbid obesity ([place])    As of 11/2018, pt considering sleeve gastectomy vs bipass as of eval by Dr. Lucia Gaskins: next steps are labs/xrays, psych and nutrit consults, and cardiology clearance.  . Nonalcoholic steatohepatitis    Viral Hep screens NEG.  CT 2015.  Transaminasemia.  U/S 07/2018 showed early changes of cirrhosis.  . OSA (obstructive sleep apnea) 09/14/2015   sleep study 09/07/15: severe obstructive sleep apnea with an AHI of 72 and SaO2 low of 75%.>refer to sleep med for eval and tx options  . Osteoarthritis    hips, shoulders, knees  . PAF (paroxysmal atrial fibrillation) ([place])    One documented episode (after getting EGD 2016).  Was on amiodarone x 3 mo.  Rate control with metoprolol + anticoag with xarelto.   . Small fiber neuropathy    Due to DM.  Symmetric hands and feet tingling/numbness.  Marland Kitchen Ulcerative colitis (HCC)    Remicade infusion Q 8 weeks: in clinical and endoscopic remission as of 12/2018 GI f/u  Past Surgical History:  Procedure Laterality Date  . ABDOMINAL HYSTERECTOMY  1980   Paps no longer indicated.  Marland Kitchen BACTERIAL OVERGROWTH TEST N/A 07/13/2015   Procedure: BACTERIAL OVERGROWTH TEST;  Surgeon: Rogene Houston, MD;  [facility];  Service: Endoscopy;  Laterality: N/A;  730    . BILATERAL SALPINGOOPHORECTOMY  02/10/2001  . BREAST REDUCTION SURGERY  1994   bilat  . CARDIOVASCULAR STRESS TEST  07/2010   Lexiscan myoview: normal  . CARPAL TUNNEL RELEASE Right 1996  . CARPAL TUNNEL RELEASE Left 03/21/2003  . CARPAL TUNNEL RELEASE Right 05/04/2013   Procedure: CARPAL TUNNEL RELEASE;  Surgeon: Cammie Sickle., MD;  Location: [place];  Service: Orthopedics;  Laterality: Right;  . CARPAL TUNNEL RELEASE Left 09/21/2013   Procedure: LEFT CARPAL TUNNEL RELEASE;  Surgeon: Cammie Sickle., MD;  Location: [place];  Service: Orthopedics;  Laterality: Left;  . CHOLECYSTECTOMY    . COLONOSCOPY WITH PROPOFOL N/A 08/04/2015   Colitis in remission.  No polyps.  Procedure: COLONOSCOPY WITH PROPOFOL;  Surgeon: Rogene Houston, MD;  Location: [facility];  Service: Endoscopy;  Laterality: N/A;  cecum time in  0820   time out  0827    total time 7 minutes  . ESOPHAGEAL DILATION N/A 08/04/2015   Procedure: ESOPHAGEAL DILATION;  Surgeon: Rogene Houston, MD;  Location: [facility];  Service: Endoscopy;  Laterality: N/A;  [place] 57, no mucousal disruption  . ESOPHAGOGASTRODUODENOSCOPY (EGD) WITH ESOPHAGEAL DILATION  12/02/2005  . ESOPHAGOGASTRODUODENOSCOPY (EGD) WITH PROPOFOL N/A 08/04/2015   Procedure: ESOPHAGOGASTRODUODENOSCOPY (EGD) WITH PROPOFOL;  Surgeon: Rogene Houston, MD;  Location: [facility];  Service: Endoscopy;  Laterality: N/A;  procedure 1  . FLEXIBLE SIGMOIDOSCOPY  01/17/2012   Procedure: FLEXIBLE SIGMOIDOSCOPY;  Surgeon: Rogene Houston, MD;  [facility];  Service: Endoscopy;  Laterality: N/A;  . HEMILAMINOTOMY LUMBAR SPINE Bilateral 09/07/1999   L4-5  . KNEE ARTHROSCOPY Right 01/1999; 10/2000  . LYSIS OF ADHESION  02/10/2001  . [employer]; 09/12/2006  . TARSAL TUNNEL RELEASE  2002  . TRANSTHORACIC ECHOCARDIOGRAM  08/04/2015   EF 60-65%, normal wall motion, mild LVH, mild LA dilation, grd I DD.  Marland Kitchen TUMOR EXCISION Left 03/21/2003   dorsal 1st web space (hand)  . URETEROLYSIS Right 02/10/2001    Outpatient Medications Prior to Visit  Medication Sig Dispense Refill  . citalopram (CELEXA) 20 MG tablet TAKE 1 TABLET BY MOUTH EVERY DAY 90 tablet 1  . dicyclomine (BENTYL) 10 MG capsule TAKE 1 CAPSULE (10 MG TOTAL) BY MOUTH 3 (THREE) TIMES DAILY BEFORE MEALS. 270 capsule 3  .  esomeprazole (NEXIUM) 20 MG capsule Take 20 mg by mouth daily before breakfast.     . furosemide (LASIX) 40 MG tablet TAKE 1 TABLET BY MOUTH EVERY DAY 90 tablet 1  . gabapentin (NEURONTIN) 300 MG capsule 1 cap po bid (to be taken with a 600 mg gabapentin tab at each dose) 180 capsule 1  . gabapentin (NEURONTIN) 600 MG tablet Take 1 tablet (600 mg total) by mouth 2 (two) times daily. (each dose is to be taken with a 300 mg gabapentin pill 180 tablet 1  . glucose blood (ONE TOUCH ULTRA TEST) test strip Use as instructed to check sugar 6 times daily 400 each 5  . hydrocortisone (PROCTOSOL HC) 2.5 % rectal cream Place 1 application rectally 2 (two) times daily. 30 g 2  . inFLIXimab (REMICADE) 100 MG  injection Inject into the vein every 8 (eight) weeks.     . Insulin Pen Needle (B-D UF III MINI PEN NEEDLES) 31G X 5 MM MISC USE TO INJECT INSULINS EQUAL TO 6 TIMES DAILY. 600 each 5  . insulin regular human CONCENTRATED (HUMULIN R) 500 UNIT/ML kwikpen INJECT 112U SUBCUTANEOUSLY AT BREAKFAST, 125U AT "LUPPER", AND 115U AT EVENING MEAL. (Patient taking differently: INJECT 120U SUBCUTANEOUSLY AT BREAKFAST, 130U AT "LUPPER", AND 120U AT EVENING MEAL. May take additional 30 U at bedtime if glucose over 200.) 64 mL 1  . KLOR-CON M10 10 MEQ tablet TAKE 2 TABLETS (20 MEQ TOTAL) BY MOUTH DAILY. -- OFFICE VISIT NEEDED FOR FURTHER REFILLS 180 tablet 0  . lactase (LACTAID) 3000 UNITS tablet Take 3,000 Units by mouth as needed (when eating foods containing dairy).     . Lancets (ONETOUCH DELICA PLUS XLKGMW10U) MISC USE AS DIRECTED TO CHECK BLOOD SUGAR UP TO 6 TIMES PER DAY. DX: E11.09 500 each 3  . loperamide (IMODIUM) 2 MG capsule Take 1 capsule (2 mg total) by mouth daily before breakfast. 30 capsule   . meclizine (ANTIVERT) 25 MG tablet Take 1 tablet (25 mg total) by mouth 3 (three) times daily as needed for dizziness or nausea. 90 tablet 0  . metoprolol succinate (TOPROL-XL) 100 MG 24 hr tablet TAKE 1 TABLET BY MOUTH  2 TIMES DAILY. TAKE WITH OR IMMEDIATELY FOLLOWING A MEAL 180 tablet 3  . Pediatric Multivitamins-Iron (FLINTSTONES PLUS IRON) chewable tablet Chew 1 tablet by mouth 2 (two) times daily.    . TRULICITY 1.5 VO/5.3GU SOPN INJECT UNDER SKIN 1.5 MG WEEKLY UNDER SKIN. 12 pen 3  . vitamin B-12 (CYANOCOBALAMIN) 1000 MCG tablet Take 5,000 mcg by mouth daily.     Alveda Reasons 20 MG TABS tablet TAKE 1 TABLET (20 MG TOTAL) BY MOUTH DAILY WITH SUPPER. 90 tablet 1   No facility-administered medications prior to visit.     Allergies  Allergen Reactions  . Actos [Pioglitazone] Other (See Comments)    Weight gain  . Benzocaine-Menthol Swelling    SWELLING OF MOUTH  . Colesevelam Other (See Comments)    GI UPSET  . Flagyl [Metronidazole Hcl] Other (See Comments)    DIAPHORESIS  . Metformin And Related Diarrhea  . Omeprazole Swelling    SWELLING OF TONGUE AND THROAT  . Shrimp [Shellfish Allergy] Itching    OF THROAT AND EARS  . Statins Other (See Comments)    HEART RACING  . Desipramine Hcl Itching, Nausea Only and Other (See Comments)    "swimmy" headed, ears itched   . Hydromorphone Itching  . Jardiance [Empagliflozin] Other (See Comments)    weakness  . Adhesive [Tape] Other (See Comments)    SKIN IRRITATION AND BRUISING  . Nisoldipine Itching  . Percocet [Oxycodone-Acetaminophen] Itching    ROS As per HPI  PE: Blood pressure 114/68, pulse 78, temperature 97.9 F (36.6 C), temperature source Temporal, resp. rate 16, height 5' 0.5" (1.537 m), weight 232 lb 9.6 oz (105.5 kg), SpO2 97 %. Body mass index is 44.68 kg/m.  Gen: Alert, well appearing.  Patient is oriented to person, place, time, and situation. AFFECT: pleasant, lucid thought and speech. No further exam today.  LABS:  Lab Results  Component Value Date   TSH 1.01 12/18/2016   Lab Results  Component Value Date   WBC 5.7 11/13/2017   HGB 12.6 11/13/2017   HCT 37.7 11/13/2017   MCV 92.2 11/13/2017   PLT 167.0 11/13/2017  Lab Results  Component Value Date   IRON 34 (L) 01/26/2019   TIBC 345 01/26/2019   FERRITIN 11 (L) 01/26/2019   Lab Results  Component Value Date   VITAMINB12 >2,000 (H) 01/26/2019   Lab Results  Component Value Date   CREATININE 0.66 10/15/2018   BUN 10 10/15/2018   NA 140 10/15/2018   K 3.7 10/15/2018   CL 104 10/15/2018   CO2 30 10/15/2018   Lab Results  Component Value Date   ALT 59 (H) 10/15/2018   AST 74 (H) 10/15/2018   ALKPHOS 74 10/15/2018   BILITOT 0.6 10/15/2018   Lab Results  Component Value Date   CHOL 224 (H) 12/01/2017   Lab Results  Component Value Date   HDL 38.60 (L) 12/01/2017   Lab Results  Component Value Date   LDLCALC 156 (H) 12/01/2017   Lab Results  Component Value Date   TRIG 149.0 12/01/2017   Lab Results  Component Value Date   CHOLHDL 6 12/01/2017   Lab Results  Component Value Date   HGBA1C 7.1 (H) 10/15/2018   IMPRESSION AND PLAN:  1) Obesity: unable to exercise due to wt and chronic hips/legs pain. She has decided to go through with gastric sleeve surgery with Dr. Lucia Gaskins, she says hopefully in the fall this year.  2) NASH: recheck AST/ALT today. As per Dr. Olevia Perches recommendation, she will get liver bx at the time of her bariatric surgery to see if any liver fibrosis.  3) DM 2 with DPN: we'll make some small changes in her insulin regimen. Instructions: Increase your breakfast insulin dose to 123 Units and increase your mid-day meal insulin to 133 Units. Decrease your supper insulin dose to 117 and decrease your "as needed" bedtime insulin dose to 25 U (if glucose >200). Check A1c and urine microalbumin/cr today.  4) HTN: The current medical regimen is effective;  continue present plan and medications. Lytes/cr today.  5) Iron def anemia, suspected to be due to chronic PPI therapy. She has been on flintstone vitamin bid for about 3 mo as per Dr. Olevia Perches instructions. She missed her f/u cbc check with him due to  covid restrictions (? Or she forgot?). We'll check CBC with iron studies again here today and make sure we forward results to Dr. Laural [name].  An After Visit Summary was printed and given to the patient.  FOLLOW UP: Return in about 4 months (around 08/22/2019) for routine chronic illness f/u.  Signed:  Crissie Sickles, MD           04/21/2019

## 2019-04-21 NOTE — Patient Instructions (Signed)
Increase your breakfast insulin dose to 123 Units and increase your mid-day meal insulin to 133 Units. Decrease your supper insulin dose to 117 and decrease your "as needed" bedtime insulin dose to 25 U (if glucose >200).

## 2019-04-22 LAB — MICROALBUMIN / CREATININE URINE RATIO
Creatinine,U: 112.6 mg/dL
Microalb Creat Ratio: 0.6 mg/g (ref 0.0–30.0)
Microalb, Ur: 0.7 mg/dL (ref 0.0–1.9)

## 2019-04-22 NOTE — Addendum Note (Signed)
Addended by: Ralph Dowdy on: 04/22/2019 12:10 PM   Modules accepted: Orders

## 2019-04-26 ENCOUNTER — Other Ambulatory Visit: Payer: Self-pay | Admitting: Family Medicine

## 2019-04-27 ENCOUNTER — Ambulatory Visit (INDEPENDENT_AMBULATORY_CARE_PROVIDER_SITE_OTHER): Payer: Medicare Other | Admitting: Family Medicine

## 2019-04-27 ENCOUNTER — Other Ambulatory Visit: Payer: Self-pay

## 2019-04-27 DIAGNOSIS — E7800 Pure hypercholesterolemia, unspecified: Secondary | ICD-10-CM

## 2019-04-27 DIAGNOSIS — E118 Type 2 diabetes mellitus with unspecified complications: Secondary | ICD-10-CM

## 2019-04-27 DIAGNOSIS — D509 Iron deficiency anemia, unspecified: Secondary | ICD-10-CM

## 2019-04-27 DIAGNOSIS — I10 Essential (primary) hypertension: Secondary | ICD-10-CM | POA: Diagnosis not present

## 2019-04-27 LAB — HEMOGLOBIN A1C: Hgb A1c MFr Bld: 7.6 % — ABNORMAL HIGH (ref 4.6–6.5)

## 2019-04-27 LAB — COMPREHENSIVE METABOLIC PANEL
ALT: 38 U/L — ABNORMAL HIGH (ref 0–35)
AST: 46 U/L — ABNORMAL HIGH (ref 0–37)
Albumin: 3.5 g/dL (ref 3.5–5.2)
Alkaline Phosphatase: 79 U/L (ref 39–117)
BUN: 12 mg/dL (ref 6–23)
CO2: 28 mEq/L (ref 19–32)
Calcium: 8.7 mg/dL (ref 8.4–10.5)
Chloride: 103 mEq/L (ref 96–112)
Creatinine, Ser: 0.72 mg/dL (ref 0.40–1.20)
GFR: 79.69 mL/min (ref >60.00)
Glucose, Bld: 177 mg/dL — ABNORMAL HIGH (ref 70–99)
Potassium: 3.9 mEq/L (ref 3.5–5.1)
Sodium: 139 mEq/L (ref 135–145)
Total Bilirubin: 0.7 mg/dL (ref 0.2–1.2)
Total Protein: 6.9 g/dL (ref 6.0–8.3)

## 2019-04-27 LAB — CBC
HCT: 26.2 % — ABNORMAL LOW (ref 36.0–46.0)
Hemoglobin: 8.1 g/dL — ABNORMAL LOW (ref 12.0–15.0)
MCHC: 31.1 g/dL (ref 30.0–36.0)
MCV: 79.9 fl (ref 78.0–100.0)
Platelets: 139 10*3/uL — ABNORMAL LOW (ref 150.0–400.0)
RBC: 3.28 Mil/uL — ABNORMAL LOW (ref 3.87–5.11)
RDW: 17.8 % — ABNORMAL HIGH (ref 11.5–15.5)
WBC: 4 10*3/uL (ref 4.0–10.5)

## 2019-04-28 ENCOUNTER — Encounter: Payer: Self-pay | Admitting: Family Medicine

## 2019-04-28 LAB — IRON,TIBC AND FERRITIN PANEL
%SAT: 8 % (calc) — ABNORMAL LOW (ref 16–45)
Ferritin: 8 ng/mL — ABNORMAL LOW (ref 16–288)
Iron: 30 ug/dL — ABNORMAL LOW (ref 45–160)
TIBC: 372 mcg/dL (calc) (ref 250–450)

## 2019-04-29 ENCOUNTER — Other Ambulatory Visit: Payer: Self-pay

## 2019-04-29 ENCOUNTER — Ambulatory Visit (HOSPITAL_COMMUNITY)
Admission: RE | Admit: 2019-04-29 | Discharge: 2019-04-29 | Disposition: A | Discharge status: Home / Self Care | Payer: Medicare Other | Source: Ambulatory Visit | Attending: Obstetrics & Gynecology | Admitting: Obstetrics & Gynecology

## 2019-04-29 DIAGNOSIS — D509 Iron deficiency anemia, unspecified: Secondary | ICD-10-CM

## 2019-04-29 DIAGNOSIS — E2839 Other primary ovarian failure: Secondary | ICD-10-CM | POA: Diagnosis not present

## 2019-04-29 DIAGNOSIS — Z1231 Encounter for screening mammogram for malignant neoplasm of breast: Secondary | ICD-10-CM | POA: Diagnosis present

## 2019-04-30 ENCOUNTER — Telehealth: Payer: Self-pay | Admitting: *Deleted

## 2019-04-30 NOTE — Telephone Encounter (Signed)
-----   Message from Megan Salon, MD sent at 04/30/2019  7:13 AM EDT ----- Please let pt know her BMD showed some mild osteopenia.  This does not need treatment.  Can consider repeating this in 5 years.  She called yesterday to ask about bariatric surgery.  Can you please ask specifically what is the question?  Thanks.

## 2019-04-30 NOTE — Telephone Encounter (Signed)
Left voice mail to call back 

## 2019-05-03 NOTE — Telephone Encounter (Signed)
Second attempt. LMTCB

## 2019-05-04 ENCOUNTER — Other Ambulatory Visit: Payer: Self-pay | Admitting: Internal Medicine

## 2019-05-04 NOTE — Telephone Encounter (Signed)
Patient is returning a call to [place].

## 2019-05-06 NOTE — Telephone Encounter (Signed)
Patient notified of results.   States Dr. Sabra Heck was going to have someone call her from our office that had bariatric surgery to talk about her experience with surgery.   Routed to Dr. Sabra Heck

## 2019-05-07 NOTE — Telephone Encounter (Signed)
Called pt and answered her questions.  Ok to close encounter.

## 2019-05-10 ENCOUNTER — Telehealth (INDEPENDENT_AMBULATORY_CARE_PROVIDER_SITE_OTHER): Payer: Self-pay | Admitting: *Deleted

## 2019-05-10 NOTE — Telephone Encounter (Signed)
Order for [name] has been completed and faxed over to [facility]. Per Dr.RehmanNotes recorded by Rogene Houston, MD on 05/03/2019 at 5:14 PM EDT  Unable to reach patient again.  Message left on her answering service.  Will proceed with colonoscopy after she has received iron infusion.  She will need propofol.  She will need to be off Xarelto for 2 days and stop p.o. iron for at least 1 week before procedure.  ------

## 2019-05-12 ENCOUNTER — Telehealth: Payer: Self-pay | Admitting: Obstetrics & Gynecology

## 2019-05-12 NOTE — Telephone Encounter (Signed)
Patient states someone was to call her from our office that has had bariatric surgery to answer some questions she has. She cannot remember who it was and believes they have called her, but she is not sure which number is theirs on her phone.

## 2019-05-14 ENCOUNTER — Encounter (HOSPITAL_COMMUNITY)
Admission: RE | Admit: 2019-05-14 | Discharge: 2019-05-14 | Disposition: A | Discharge status: Home / Self Care | Payer: Medicare Other | Source: Ambulatory Visit | Attending: Internal Medicine | Admitting: Internal Medicine

## 2019-05-14 ENCOUNTER — Other Ambulatory Visit: Payer: Self-pay

## 2019-05-14 DIAGNOSIS — D509 Iron deficiency anemia, unspecified: Secondary | ICD-10-CM | POA: Diagnosis present

## 2019-05-14 MED ORDER — SODIUM CHLORIDE 0.9 % IV SOLN
510.0000 mg | INTRAVENOUS | Status: DC
  Administered 2019-05-14: 510 mg via INTRAVENOUS
  Filled 2019-05-14: qty 510

## 2019-05-14 MED ORDER — SODIUM CHLORIDE 0.9 % IV SOLN
Freq: Once | INTRAVENOUS | Status: AC
  Administered 2019-05-14: 13:00:00 250 mL via INTRAVENOUS

## 2019-05-15 ENCOUNTER — Other Ambulatory Visit: Payer: Self-pay | Admitting: Family Medicine

## 2019-05-18 ENCOUNTER — Other Ambulatory Visit: Payer: Medicare Other

## 2019-05-18 DIAGNOSIS — D509 Iron deficiency anemia, unspecified: Secondary | ICD-10-CM

## 2019-05-18 LAB — HEMOCCULT SLIDES (X 3 CARDS)
Fecal Occult Blood: NEGATIVE
OCCULT 1: POSITIVE — AB
OCCULT 2: POSITIVE — AB
OCCULT 3: NEGATIVE
OCCULT 4: NEGATIVE
OCCULT 5: NEGATIVE

## 2019-05-19 ENCOUNTER — Other Ambulatory Visit: Payer: Self-pay | Admitting: Family Medicine

## 2019-05-20 ENCOUNTER — Other Ambulatory Visit (INDEPENDENT_AMBULATORY_CARE_PROVIDER_SITE_OTHER): Payer: Self-pay | Admitting: *Deleted

## 2019-05-20 ENCOUNTER — Encounter (INDEPENDENT_AMBULATORY_CARE_PROVIDER_SITE_OTHER): Payer: Self-pay | Admitting: *Deleted

## 2019-05-20 DIAGNOSIS — D509 Iron deficiency anemia, unspecified: Secondary | ICD-10-CM | POA: Insufficient documentation

## 2019-05-20 DIAGNOSIS — R195 Other fecal abnormalities: Secondary | ICD-10-CM

## 2019-05-21 ENCOUNTER — Other Ambulatory Visit: Payer: Self-pay

## 2019-05-21 ENCOUNTER — Encounter (HOSPITAL_COMMUNITY)
Admission: RE | Admit: 2019-05-21 | Discharge: 2019-05-21 | Disposition: A | Discharge status: Home / Self Care | Payer: Medicare Other | Source: Ambulatory Visit | Attending: Internal Medicine | Admitting: Internal Medicine

## 2019-05-21 ENCOUNTER — Encounter (HOSPITAL_COMMUNITY): Payer: Self-pay

## 2019-05-21 DIAGNOSIS — D509 Iron deficiency anemia, unspecified: Secondary | ICD-10-CM | POA: Diagnosis not present

## 2019-05-21 MED ORDER — SODIUM CHLORIDE 0.9 % IV SOLN
510.0000 mg | Freq: Once | INTRAVENOUS | Status: AC
  Administered 2019-05-21: 510 mg via INTRAVENOUS
  Filled 2019-05-21: qty 510

## 2019-05-21 MED ORDER — SODIUM CHLORIDE 0.9 % IV SOLN
Freq: Once | INTRAVENOUS | Status: AC
  Administered 2019-05-21: 13:00:00 via INTRAVENOUS

## 2019-05-23 ENCOUNTER — Other Ambulatory Visit: Payer: Self-pay | Admitting: Family Medicine

## 2019-05-24 ENCOUNTER — Other Ambulatory Visit: Payer: Self-pay | Admitting: Cardiology

## 2019-05-24 ENCOUNTER — Telehealth: Payer: Self-pay | Admitting: Cardiology

## 2019-05-24 NOTE — Telephone Encounter (Signed)
   Primary Cardiologist: Kirk Ruths, MD  Chart reviewed as part of pre-operative protocol coverage. Request received for guidance on holding anticoagulation.   Per our clinical pharmacy team: Pt takes Xarelto for afib with CHADS2VASc score of 4 (age, sex, HTN, DM). Renal function is normal. Ok to hold Xarelto for 2 days prior to procedure    I will route this recommendation to the requesting party via [facility] fax function and remove from pre-op pool.  Please call with questions.  Ledora Bottcher, PA 05/24/2019, 4:58 PM

## 2019-05-24 NOTE — Telephone Encounter (Signed)
Pt takes Xarelto for afib with CHADS2VASc score of 4 (age, sex, HTN, DM). Renal function is normal. Ok to hold Xarelto for 2 days prior to procedure.

## 2019-05-24 NOTE — Telephone Encounter (Signed)
Can you please give recommendations for xarelto?

## 2019-05-24 NOTE — Telephone Encounter (Signed)
° °  [facility] Pre-operative Risk Assessment    Request for surgical clearance:  1. What type of surgery is being performed? Colonoscopy   2. When is this surgery scheduled? 06-11-19   3. What type of clearance is required (medical clearance vs. Pharmacy clearance to hold med vs. Both)? Medicine  4. Are there any medications that need to be held prior to surgery and how long? Can opt holdbllood thinner 2 days prior to surgery? 5.  6. actice name and name of physician performing surgery? Dr Laural [name]   7. What is your office phone number  [phone].     7.   What is your office fax number  [phone]  8.   Anesthesia type (None, local, MAC, general) ? Monitor Anesthesia   [name] 05/24/2019, 11:08 AM  _________________________________________________________________   (provider comments below)

## 2019-05-25 ENCOUNTER — Other Ambulatory Visit: Payer: Self-pay

## 2019-05-25 NOTE — Telephone Encounter (Signed)
Spoke to pt and informed that she has been cleared for colonoscopy and will need to hole Xarelto for 2 days prior. Pt verbalized thanks and understanding.

## 2019-05-27 ENCOUNTER — Encounter (HOSPITAL_COMMUNITY)
Admission: RE | Admit: 2019-05-27 | Discharge: 2019-05-27 | Disposition: A | Discharge status: Home / Self Care | Payer: Medicare Other | Source: Ambulatory Visit | Attending: Internal Medicine | Admitting: Internal Medicine

## 2019-05-27 ENCOUNTER — Other Ambulatory Visit: Payer: Self-pay

## 2019-05-27 ENCOUNTER — Other Ambulatory Visit: Payer: Self-pay | Admitting: Family Medicine

## 2019-05-27 DIAGNOSIS — D509 Iron deficiency anemia, unspecified: Secondary | ICD-10-CM | POA: Diagnosis not present

## 2019-05-27 LAB — HEMOGLOBIN AND HEMATOCRIT, BLOOD
HCT: 34.1 % — ABNORMAL LOW (ref 36.0–46.0)
Hemoglobin: 10 g/dL — ABNORMAL LOW (ref 12.0–15.0)

## 2019-05-27 NOTE — Progress Notes (Signed)
Results for [name], [name] (MRN [id number]) as of 05/27/2019 12:56  Ref. Range 05/27/2019 12:20  Hemoglobin Latest Ref Range: 12.0 - 15.0 g/dL 10.0 (L)  HCT Latest Ref Range: 36.0 - 46.0 % 34.1 (L)

## 2019-05-27 NOTE — Progress Notes (Signed)
05/27/2019-1216- pt here for labs. Blood drawn and sent to lab for Hgb/Hct results.

## 2019-05-27 NOTE — Progress Notes (Signed)
Dr Laural [name] notified of Hgb 10.0/Hct 34.1 results. No new orders given.

## 2019-05-28 ENCOUNTER — Encounter (HOSPITAL_COMMUNITY): Payer: Medicare Other

## 2019-06-01 ENCOUNTER — Encounter: Payer: Medicare Other | Attending: Surgery | Admitting: Skilled Nursing Facility1

## 2019-06-01 ENCOUNTER — Other Ambulatory Visit: Payer: Self-pay | Admitting: Family Medicine

## 2019-06-01 ENCOUNTER — Other Ambulatory Visit: Payer: Self-pay

## 2019-06-01 DIAGNOSIS — E1169 Type 2 diabetes mellitus with other specified complication: Secondary | ICD-10-CM | POA: Diagnosis present

## 2019-06-01 DIAGNOSIS — I10 Essential (primary) hypertension: Secondary | ICD-10-CM | POA: Diagnosis not present

## 2019-06-01 DIAGNOSIS — E669 Obesity, unspecified: Secondary | ICD-10-CM | POA: Diagnosis not present

## 2019-06-01 NOTE — Progress Notes (Signed)
Bariatric Supervised Weight Loss Visit Appt Start Time: 2:00  End Time: 2:36  1st SWL   Referral Stated SWL Appointments Required: 6  Proposed Surgery Type: Sleeve Gastrectomy    NUTRITION ASSESSMENT   Anthropometrics  Start weight at [facility]:  lbs Today's weight: 226 lbs Weight change:  lbs (since previous nutrition appointment)  BMI: 42.70 kg/m2     Psychosocial/Lifestyle  Dx: DM  Pt arrived with her seemingly supportive husband.  Pt states she did not work on the previous goals from the last appointment because it was not the right time with her husband having fallen with broken ribs and her granddaughter being in college at [employer] with the covid outbreak.  Pt states she tried to tell herself to eat less at each meal.  Pt states she reads until 2-3am and wakes at 10am.  Pt states she has UC with flare ups being caused by raw vegetables and fried foods. Pt states she is so used to taking care of everyone else she does not know how to focus on herself and make herself the priority.  Pt admits to food being her comfort and has some anxiety and feels overwhelmed about making lifestyle changes especially amongst all this stress: Dietitian advised she focus in on just the goals at each appt an dont try to take it all on at once; pt states she felt better with a specific plan by the end of the appt.   Dietitian educated the pt on her lack of nutritious meals/meal skipping in conjunction with her advanced age leading to more morbidities/motality and bone demineralizing.  Pt states she has had some lows (usually occurring when she wakes up) and still (conversation is a contiatuion from last appt) does not know what causes them): Dietitian educated the pt on the fact she is fasting from 5pm-10am most likely causing those lows.   Pt needs small very specific goals for each visit so she does not feel overwhelmed. Dietitian also has pt write her own goals on her sheet so she is more  accountable to them  To discuss next time: Any barriers that came up for reaching her goals If she is ready a more "normal" bedtime How putting together balanced meals went Review blood sugar readings Choosing the next goals   Medications: trulicity omeprezole Furosemide  Labs: 7.6  Notable Signs/Symptoms  24-Hr Dietary Recall First Meal: fast food biscuit and soda Snack: fruit Second Meal: Snack:  Third Meal 5 or 6pm:  mayo banana sandwich and chips Snack: 1 chic on a stick  Beverages: soda, water  Physical Activity  ADL's  Percent Successful in meeting last appointments goals:  25% (as stated by pt)   Pre-Op Goals Previously Chosen Get back to using your bike: 30 minutes at a time 4 days a week: eat around .5-1 hours before getting on it Log your food and beverage via an app or pen and paper: and blood sugar readings: test fasting and 2 hours after each meal    Estimated Energy Needs Calories: 1500 Carbohydrate:  Protein:  Fat:      NUTRITION DIAGNOSIS  Overweight/obesity ([place]-3.3) related to past poor dietary habits and physical inactivity as evidenced by patient w/ planned Sleeve  surgery following dietary guidelines for continued weight loss.     NUTRITION INTERVENTION  Nutrition counseling (C-1) and education (E-2) to facilitate bariatric surgery goals.   New Pre-Op Goals to Work On -Apple and peanut butter at 10pm or greek yogurt and cereal -  Create balanced meals using the meal ideas sheet: eat half sandwiches not whole sandwiches along with non-starchy vegetables -Check your blood sugars 2 hours after a meal at least 3 days a week    Handouts Provided Include   Meal ideas   Change readiness: Contemplative  Demonstrated degree of understanding via: Teach Back      MONITORING & EVALUATION Dietary intake, weekly physical activity, body weight, and pre-op goals.

## 2019-06-08 ENCOUNTER — Encounter (HOSPITAL_COMMUNITY)
Admission: RE | Admit: 2019-06-08 | Discharge: 2019-06-08 | Disposition: A | Discharge status: Home / Self Care | Payer: Medicare Other | Source: Ambulatory Visit | Attending: Internal Medicine | Admitting: Internal Medicine

## 2019-06-08 ENCOUNTER — Other Ambulatory Visit (HOSPITAL_COMMUNITY)
Admission: RE | Admit: 2019-06-08 | Discharge: 2019-06-08 | Disposition: A | Discharge status: Home / Self Care | Payer: Medicare Other | Source: Ambulatory Visit | Attending: Internal Medicine | Admitting: Internal Medicine

## 2019-06-08 ENCOUNTER — Other Ambulatory Visit: Payer: Self-pay

## 2019-06-08 DIAGNOSIS — Z20828 Contact with and (suspected) exposure to other viral communicable diseases: Secondary | ICD-10-CM | POA: Insufficient documentation

## 2019-06-08 DIAGNOSIS — Z01812 Encounter for preprocedural laboratory examination: Secondary | ICD-10-CM | POA: Insufficient documentation

## 2019-06-09 LAB — SARS CORONAVIRUS 2 (TAT 6-24 HRS): SARS Coronavirus 2: NEGATIVE

## 2019-06-11 ENCOUNTER — Encounter (HOSPITAL_COMMUNITY): Payer: Self-pay

## 2019-06-11 ENCOUNTER — Ambulatory Visit (HOSPITAL_COMMUNITY): Payer: Medicare Other | Admitting: Anesthesiology

## 2019-06-11 ENCOUNTER — Ambulatory Visit (HOSPITAL_COMMUNITY)
Admission: RE | Admit: 2019-06-11 | Discharge: 2019-06-11 | Disposition: A | Discharge status: Home / Self Care | Payer: Medicare Other | Attending: Internal Medicine | Admitting: Internal Medicine

## 2019-06-11 ENCOUNTER — Other Ambulatory Visit: Payer: Self-pay

## 2019-06-11 ENCOUNTER — Encounter (HOSPITAL_COMMUNITY)
Admission: RE | Disposition: A | Discharge status: Home / Self Care | Payer: Self-pay | Source: Home / Self Care | Attending: Internal Medicine

## 2019-06-11 DIAGNOSIS — Z79899 Other long term (current) drug therapy: Secondary | ICD-10-CM | POA: Insufficient documentation

## 2019-06-11 DIAGNOSIS — K644 Residual hemorrhoidal skin tags: Secondary | ICD-10-CM

## 2019-06-11 DIAGNOSIS — D509 Iron deficiency anemia, unspecified: Secondary | ICD-10-CM | POA: Insufficient documentation

## 2019-06-11 DIAGNOSIS — R195 Other fecal abnormalities: Secondary | ICD-10-CM

## 2019-06-11 DIAGNOSIS — D500 Iron deficiency anemia secondary to blood loss (chronic): Secondary | ICD-10-CM

## 2019-06-11 DIAGNOSIS — Z6841 Body Mass Index (BMI) 40.0 and over, adult: Secondary | ICD-10-CM | POA: Insufficient documentation

## 2019-06-11 DIAGNOSIS — K5730 Diverticulosis of large intestine without perforation or abscess without bleeding: Secondary | ICD-10-CM | POA: Insufficient documentation

## 2019-06-11 DIAGNOSIS — Z7901 Long term (current) use of anticoagulants: Secondary | ICD-10-CM | POA: Insufficient documentation

## 2019-06-11 DIAGNOSIS — F419 Anxiety disorder, unspecified: Secondary | ICD-10-CM | POA: Insufficient documentation

## 2019-06-11 DIAGNOSIS — M797 Fibromyalgia: Secondary | ICD-10-CM | POA: Insufficient documentation

## 2019-06-11 DIAGNOSIS — K5100 Ulcerative (chronic) pancolitis without complications: Secondary | ICD-10-CM

## 2019-06-11 DIAGNOSIS — G629 Polyneuropathy, unspecified: Secondary | ICD-10-CM | POA: Insufficient documentation

## 2019-06-11 DIAGNOSIS — K7460 Unspecified cirrhosis of liver: Secondary | ICD-10-CM | POA: Diagnosis not present

## 2019-06-11 DIAGNOSIS — K7581 Nonalcoholic steatohepatitis (NASH): Secondary | ICD-10-CM | POA: Insufficient documentation

## 2019-06-11 DIAGNOSIS — K589 Irritable bowel syndrome without diarrhea: Secondary | ICD-10-CM | POA: Diagnosis not present

## 2019-06-11 DIAGNOSIS — I480 Paroxysmal atrial fibrillation: Secondary | ICD-10-CM | POA: Diagnosis not present

## 2019-06-11 DIAGNOSIS — I10 Essential (primary) hypertension: Secondary | ICD-10-CM | POA: Diagnosis not present

## 2019-06-11 DIAGNOSIS — K633 Ulcer of intestine: Secondary | ICD-10-CM

## 2019-06-11 DIAGNOSIS — G4733 Obstructive sleep apnea (adult) (pediatric): Secondary | ICD-10-CM | POA: Insufficient documentation

## 2019-06-11 DIAGNOSIS — K6389 Other specified diseases of intestine: Secondary | ICD-10-CM

## 2019-06-11 DIAGNOSIS — K5190 Ulcerative colitis, unspecified, without complications: Secondary | ICD-10-CM | POA: Diagnosis not present

## 2019-06-11 DIAGNOSIS — D649 Anemia, unspecified: Secondary | ICD-10-CM | POA: Insufficient documentation

## 2019-06-11 DIAGNOSIS — Z794 Long term (current) use of insulin: Secondary | ICD-10-CM | POA: Insufficient documentation

## 2019-06-11 DIAGNOSIS — E119 Type 2 diabetes mellitus without complications: Secondary | ICD-10-CM | POA: Insufficient documentation

## 2019-06-11 DIAGNOSIS — K219 Gastro-esophageal reflux disease without esophagitis: Secondary | ICD-10-CM | POA: Insufficient documentation

## 2019-06-11 HISTORY — PX: BIOPSY: SHX5522

## 2019-06-11 HISTORY — PX: COLONOSCOPY WITH PROPOFOL: SHX5780

## 2019-06-11 LAB — GLUCOSE, CAPILLARY: Glucose-Capillary: 135 mg/dL — ABNORMAL HIGH (ref 70–99)

## 2019-06-11 LAB — HEMOGLOBIN AND HEMATOCRIT, BLOOD
HCT: 36.5 % (ref 36.0–46.0)
Hemoglobin: 11.2 g/dL — ABNORMAL LOW (ref 12.0–15.0)

## 2019-06-11 SURGERY — COLONOSCOPY WITH PROPOFOL
Anesthesia: General

## 2019-06-11 MED ORDER — CHLORHEXIDINE GLUCONATE CLOTH 2 % EX PADS: 6.0000 | MEDICATED_PAD | Freq: Once | CUTANEOUS | Status: DC

## 2019-06-11 MED ORDER — PROPOFOL 10 MG/ML IV BOLUS
INTRAVENOUS | Status: AC
  Filled 2019-06-11: qty 80

## 2019-06-11 MED ORDER — PROPOFOL 500 MG/50ML IV EMUL
INTRAVENOUS | Status: DC | PRN
  Administered 2019-06-11: 150 ug/kg/min via INTRAVENOUS
  Administered 2019-06-11: 75 ug/kg/min via INTRAVENOUS

## 2019-06-11 MED ORDER — LACTATED RINGERS IV SOLN
INTRAVENOUS | Status: DC
  Administered 2019-06-11: 07:00:00 via INTRAVENOUS

## 2019-06-11 MED ORDER — KETAMINE HCL 50 MG/5ML IJ SOSY
PREFILLED_SYRINGE | INTRAMUSCULAR | Status: AC
  Filled 2019-06-11: qty 5

## 2019-06-11 MED ORDER — PROMETHAZINE HCL 25 MG/ML IJ SOLN: 6.2500 mg | INTRAMUSCULAR | Status: DC | PRN

## 2019-06-11 MED ORDER — MIDAZOLAM HCL 2 MG/2ML IJ SOLN: 0.5000 mg | Freq: Once | INTRAMUSCULAR | Status: DC | PRN

## 2019-06-11 MED ORDER — PROPOFOL 10 MG/ML IV BOLUS
INTRAVENOUS | Status: DC | PRN
  Administered 2019-06-11: 20 mg via INTRAVENOUS

## 2019-06-11 NOTE — Discharge Instructions (Signed)
Resume Xarelto on 06/12/2019. Resume other medications as before. Resume usual diet. No driving for 24 hours. Patient will call with results of blood test and biopsy.

## 2019-06-11 NOTE — Anesthesia Preprocedure Evaluation (Addendum)
Anesthesia Evaluation  Patient identified by MRN, date of birth, ID band Patient awake  General Assessment Comment:Had Afib after one Colonoscopy   Reviewed: Allergy & Precautions, NPO status , Patient's Chart, lab work & pertinent test results, reviewed documented beta blocker date and time   History of Anesthesia Complications (+) PONV and history of anesthetic complications  Airway Mallampati: II  TM Distance: >3 FB Neck ROM: Full    Dental no notable dental hx. (+) Teeth Intact   Pulmonary sleep apnea and Continuous Positive Airway Pressure Ventilation ,    Pulmonary exam normal breath sounds clear to auscultation       Cardiovascular Exercise Tolerance: Good hypertension, Pt. on medications and Pt. on home beta blockers Normal cardiovascular exam+ dysrhythmias Atrial Fibrillation I Rhythm:Regular Rate:Normal  Intermittent Afib -denies DCCV On eliquis    Neuro/Psych Anxiety  Neuromuscular disease negative psych ROS   GI/Hepatic Neg liver ROS, PUD, GERD  Medicated and Controlled,Denies Hepatitis -fatty liver H/o UC on remicaide   Endo/Other  negative endocrine ROSdiabetes  Renal/GU negative Renal ROS  negative genitourinary   Musculoskeletal  (+) Arthritis , Osteoarthritis,  Fibromyalgia -  Abdominal   Peds negative pediatric ROS (+)  Hematology negative hematology ROS (+) anemia ,   Anesthesia Other Findings   Reproductive/Obstetrics negative OB ROS                            Anesthesia Physical Anesthesia Plan  ASA: III  Anesthesia Plan: General   Post-op Pain Management:    Induction: Intravenous  PONV Risk Score and Plan: 4 or greater and Ondansetron, Dexamethasone, Propofol infusion, TIVA, Midazolam and Treatment may vary due to age or medical condition  Airway Management Planned: Nasal Cannula and Simple Face Mask  Additional Equipment:   Intra-op Plan:    Post-operative Plan:   Informed Consent: I have reviewed the patients History and Physical, chart, labs and discussed the procedure including the risks, benefits and alternatives for the proposed anesthesia with the patient or authorized representative who has indicated his/her understanding and acceptance.     Dental advisory given  Plan Discussed with: CRNA  Anesthesia Plan Comments: (Plan Full PPE use  Plan GA with GETA as needed d/w pt -WTP with same after Q&A)        Anesthesia Quick Evaluation

## 2019-06-11 NOTE — Telephone Encounter (Signed)
Contacted pt who is going to call her and asked her to call again.

## 2019-06-11 NOTE — H&P (Signed)
[name] is an 73 y.o. female.   Chief Complaint: Patient is here for colonoscopy. HPI: Patient is 72 year old Caucasian female who has chronic ulcerative colitis who was recently found to have iron deficiency anemia.  She has received parenteral iron with a rise in her hemoglobin.  Her last colonoscopy was in November 2016 and she was felt to be in remission.  She has anywhere from from 1-6 bowel movements per day.  50% of stools are formed.  She denies melena or rectal bleeding.  He also denies abdominal pain.  She has chronic GERD.  She is on a PPI.  Heartburn is well controlled with therapy.  Patient is on infliximab infusion every 8 weeks. Patient is also on Xarelto which is on hold for this procedure.  Last dose was on 06/08/2019. Patient is here for diagnostic colonoscopy.  Past Medical History:  Diagnosis Date  . Anxiety   . 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  Marland Kitchen Dietary iron deficiency without anemia 12/2018   Possibly inadequate absorption secondary to chronic/long term PPI therapy.  flintstone vitamin bid started end of april 2020.    Marland Kitchen Fibromyalgia   . GERD   . History of thrombocytopenia 12/2011  . Hyperlipidemia    Intolerant of statins  . HYPERTENSION   . IBS (irritable bowel syndrome)    -D.  Good response to bentyl and imodium as of 06/2018 GI f/u.  Marland Kitchen IDDM (insulin dependent diabetes mellitus) ([place])    with DPN (managed by Dr. Cruzita Lederer but then in 2018 pt preferred to have me manage for her convenience)  . Limited mobility    Requires a walker for arthritic pain, widespread musculoskeletal pain, and neuropathic pain.  . Morbid obesity ([place])    As of 11/2018, pt considering sleeve gastectomy vs bipass as of eval by Dr. Lucia Gaskins: next steps are labs/xrays, psych and nutrit consults, and cardiology clearance.  . Nonalcoholic steatohepatitis    Viral Hep  screens NEG.  CT 2015.  Transaminasemia.  U/S 07/2018 showed early changes of cirrhosis.  . OSA (obstructive sleep apnea) 09/14/2015   sleep study 09/07/15: severe obstructive sleep apnea with an AHI of 72 and SaO2 low of 75%.>refer to sleep med for eval and tx options  . Osteoarthritis    hips, shoulders, knees  . PAF (paroxysmal atrial fibrillation) ([place])    One documented episode (after getting EGD 2016).  Was on amiodarone x 3 mo.  Rate control with metoprolol + anticoag with xarelto.   . Small fiber neuropathy    Due to DM.  Symmetric hands and feet tingling/numbness.  Marland Kitchen Ulcerative colitis ([place])    Remicade infusion Q 8 weeks: in clinical and endoscopic remission as of 12/2018 GI f/u    Past Surgical History:  Procedure Laterality Date  . ABDOMINAL HYSTERECTOMY  1980   Paps no longer indicated.  Marland Kitchen BACTERIAL OVERGROWTH TEST N/A 07/13/2015   Procedure: BACTERIAL OVERGROWTH TEST;  Surgeon: Rogene Houston, MD;  [facility];  Service: Endoscopy;  Laterality: N/A;  730    . BILATERAL SALPINGOOPHORECTOMY  02/10/2001  . BREAST REDUCTION SURGERY  1994   bilat  . CARDIOVASCULAR STRESS TEST  07/2010   Lexiscan myoview: normal  . CARPAL TUNNEL RELEASE Right 1996  . CARPAL TUNNEL RELEASE Left 03/21/2003  . CARPAL TUNNEL RELEASE Right 05/04/2013   Procedure: CARPAL TUNNEL RELEASE;  Surgeon: Cammie Sickle., MD;  Location[facility];  Service: Orthopedics;  Laterality: Right;  . CARPAL TUNNEL RELEASE Left 09/21/2013   Procedure: LEFT CARPAL TUNNEL RELEASE;  Surgeon: Cammie Sickle., MD;  Location: [place];  Service: Orthopedics;  Laterality: Left;  . CHOLECYSTECTOMY    . COLONOSCOPY WITH PROPOFOL N/A 08/04/2015   Colitis in remission.  No polyps.  Procedure: COLONOSCOPY WITH PROPOFOL;  Surgeon: Rogene Houston, MD;  Location: [facility];  Service: Endoscopy;  Laterality: N/A;  cecum time in  0820   time out  0827    total time 7 minutes  .  ESOPHAGEAL DILATION N/A 08/04/2015   Procedure: ESOPHAGEAL DILATION;  Surgeon: Rogene Houston, MD;  Location: [facility];  Service: Endoscopy;  Laterality: N/A;  [place] 57, no mucousal disruption  . ESOPHAGOGASTRODUODENOSCOPY (EGD) WITH ESOPHAGEAL DILATION  12/02/2005  . ESOPHAGOGASTRODUODENOSCOPY (EGD) WITH PROPOFOL N/A 08/04/2015   Procedure: ESOPHAGOGASTRODUODENOSCOPY (EGD) WITH PROPOFOL;  Surgeon: Rogene Houston, MD;  Location: [facility];  Service: Endoscopy;  Laterality: N/A;  procedure 1  . FLEXIBLE SIGMOIDOSCOPY  01/17/2012   Procedure: FLEXIBLE SIGMOIDOSCOPY;  Surgeon: Rogene Houston, MD;  [facility];  Service: Endoscopy;  Laterality: N/A;  . HEMILAMINOTOMY LUMBAR SPINE Bilateral 09/07/1999   L4-5  . KNEE ARTHROSCOPY Right 01/1999; 10/2000  . LYSIS OF ADHESION  02/10/2001  . [place]; 09/12/2006  . TARSAL TUNNEL RELEASE  2002  . TRANSTHORACIC ECHOCARDIOGRAM  08/04/2015   EF 60-65%, normal wall motion, mild LVH, mild LA dilation, grd I DD.  Marland Kitchen TUMOR EXCISION Left 03/21/2003   dorsal 1st web space (hand)  . URETEROLYSIS Right 02/10/2001    Family History  Problem Relation Age of Onset  . Diabetes Mother   . Hypertension Mother   . Heart attack Father        Mid 12's  . Heart disease Father   . Lung disease Father        spot on lung; had lung surgery  . Alcohol abuse Other   . Hypertension Son   . Diabetes Son   . Emphysema Maternal Grandfather   . Asthma Maternal Grandfather   . Colon cancer Paternal Grandfather   . Stomach cancer Paternal Grandmother   . Neuropathy Neg Hx    Social History:  reports that she has never smoked. She has never used smokeless tobacco. She reports current alcohol use. She reports that she does not use drugs.  Allergies:  Allergies  Allergen Reactions  . Omeprazole Anaphylaxis and Swelling    SWELLING OF TONGUE AND THROAT  . Actos [Pioglitazone] Other (See Comments)    Weight gain  . Benzocaine-Menthol Swelling    SWELLING  OF MOUTH  . Colesevelam Other (See Comments)    GI UPSET  . Flagyl [Metronidazole Hcl] Other (See Comments)    DIAPHORESIS  . Metformin And Related Diarrhea  . Shrimp [Shellfish Allergy] Itching    OF THROAT AND EARS  . Statins Other (See Comments)    HEART RACING  . Desipramine Hcl Itching, Nausea Only and Other (See Comments)    "swimmy" headed, ears itched   . Hydromorphone Itching  . Jardiance [Empagliflozin] Other (See Comments)    weakness  . Lactose Intolerance (Gi) Diarrhea    Gas, bloating  . Adhesive [Tape] Other (See Comments)    SKIN IRRITATION AND BRUISING  . Nisoldipine Itching  . Percocet [Oxycodone-Acetaminophen] Itching    Medications Prior  to Admission  Medication Sig Dispense Refill  . acetaminophen (TYLENOL) 500 MG tablet Take 1,000 mg by mouth every 6 (six) hours as needed for moderate pain or headache.    . citalopram (CELEXA) 20 MG tablet TAKE 1 TABLET BY MOUTH EVERY DAY (Patient taking differently: Take 20 mg by mouth at bedtime. ) 90 tablet 0  . Cyanocobalamin (B-12) 5000 MCG CAPS Take 5,000 mcg by mouth daily.    Marland Kitchen dicyclomine (BENTYL) 10 MG capsule TAKE 1 CAPSULE (10 MG TOTAL) BY MOUTH 3 (THREE) TIMES DAILY BEFORE MEALS. 270 capsule 3  . esomeprazole (NEXIUM) 20 MG capsule Take 20 mg by mouth daily before breakfast.     . furosemide (LASIX) 40 MG tablet TAKE 1 TABLET BY MOUTH EVERY DAY (Patient taking differently: Take 40 mg by mouth daily. ) 90 tablet 1  . gabapentin (NEURONTIN) 300 MG capsule 1 cap po bid (to be taken with a 600 mg gabapentin tab at each dose) (Patient taking differently: Take 300 mg by mouth 2 (two) times daily. Take with 600 mg to equal 900 mg twice daily) 180 capsule 1  . gabapentin (NEURONTIN) 600 MG tablet Take 1 tablet (600 mg total) by mouth 2 (two) times daily. (each dose is to be taken with a 300 mg gabapentin pill (Patient taking differently: Take 600 mg by mouth 2 (two) times daily. Take with 300 mg to equal 900 mg twice daily)  180 tablet 1  . glucose blood (ONE TOUCH ULTRA TEST) test strip Use as instructed to check sugar 6 times daily 400 each 5  . hydrocortisone (PROCTOSOL HC) 2.5 % rectal cream Place 1 application rectally 2 (two) times daily. (Patient taking differently: Place 1 application rectally 2 (two) times daily as needed for anal itching. ) 30 g 2  . inFLIXimab (REMICADE) 100 MG injection Inject into the vein every 8 (eight) weeks.     . Insulin Pen Needle (B-D UF III MINI PEN NEEDLES) 31G X 5 MM MISC USE TO INJECT INSULINS EQUAL TO 6 TIMES DAILY. 600 each 5  . insulin regular human CONCENTRATED (HUMULIN R U-500 KWIKPEN) 500 UNIT/ML kwikpen Inject 123U subcutaneously at breakfast, 133U mid-day. 117U at supper. If sugar is greater than 200 at bedtime inject an additional 25U. (Patient taking differently: Inject 25-135 Units into the skin See admin instructions. Inject 125 units subcutaneously at breakfast, 135 units at lunch, and 125 units at supper. If sugar is greater than 200 at bedtime inject an additional 25U.) 60 mL 0  . KLOR-CON M10 10 MEQ tablet TAKE 2 TABLETS (20 MEQ TOTAL) BY MOUTH DAILY. -- OFFICE VISIT NEEDED FOR FURTHER REFILLS (Patient taking differently: Take 10 mEq by mouth 2 (two) times daily. ) 180 tablet 0  . lactase (LACTAID) 3000 UNITS tablet Take 3,000 Units by mouth as needed (when eating foods containing dairy).     . Lancets (ONETOUCH DELICA PLUS EMVVKP22E) MISC USE AS DIRECTED TO CHECK BLOOD SUGAR UP TO 6 TIMES PER DAY. DX: E11.09 500 each 3  . loperamide (IMODIUM) 2 MG capsule Take 2-4 mg by mouth as needed for diarrhea or loose stools.  30 capsule   . meclizine (ANTIVERT) 25 MG tablet Take 1 tablet (25 mg total) by mouth 3 (three) times daily as needed for dizziness or nausea. 90 tablet 0  . metoprolol succinate (TOPROL-XL) 100 MG 24 hr tablet TAKE 1 TABLET BY MOUTH 2 TIMES DAILY. TAKE WITH OR IMMEDIATELY FOLLOWING A MEAL (Patient taking differently: Take 100 mg  by mouth 2 (two) times  daily. ) 180 tablet 3  . Pediatric Multivitamins-Iron (FLINTSTONES PLUS IRON) chewable tablet Chew 1 tablet by mouth 2 (two) times daily. (Patient taking differently: Chew 2 tablets by mouth at bedtime. )    . TRULICITY 1.5 QK/8.6NO SOPN INJECT UNDER SKIN 1.5 MG WEEKLY UNDER SKIN. (Patient taking differently: Inject 1.5 mg as directed every Thursday. ) 12 pen 3  . XARELTO 20 MG TABS tablet TAKE 1 TABLET (20 MG TOTAL) BY MOUTH DAILY WITH SUPPER. (Patient taking differently: Take 20 mg by mouth daily with supper. ) 90 tablet 1    Results for orders placed or performed during the hospital encounter of 06/11/19 (from the past 48 hour(s))  Glucose, capillary     Status: Abnormal   Collection Time: 06/11/19  7:18 AM  Result Value Ref Range   Glucose-Capillary 135 (H) 70 - 99 mg/dL   No results found.  ROS  Blood pressure (!) 162/75, pulse 70, resp. rate 18, SpO2 97 %. Physical Exam  Constitutional: She appears well-developed and well-nourished.  HENT:  Mouth/Throat: Oropharynx is clear and moist.  Eyes: Conjunctivae are normal.  Neck: No thyromegaly present.  Cardiovascular: Normal rate, regular rhythm and normal heart sounds.  No murmur heard. Respiratory: Effort normal and breath sounds normal.  GI:  Abdomen is full.  She has low midline scar.  Abdomen is soft and nontender without organomegaly or masses  Musculoskeletal:        General: Edema present.     Comments: Pretibial edema is predominantly nonpitting.  Lymphadenopathy:    She has no cervical adenopathy.  Neurological: She is alert.  Skin: Skin is warm and dry.     Assessment/Plan Chronic ulcerative colitis. Heme positive stool and iron deficiency anemia.  [name], MD 06/11/2019, 7:20 AM

## 2019-06-11 NOTE — Anesthesia Procedure Notes (Signed)
Procedure Name: General with mask airway Date/Time: 06/11/2019 7:29 AM Performed by: Andree Elk, Amy A, CRNA Pre-anesthesia Checklist: Timeout performed, Patient being monitored, Suction available, Emergency Drugs available and Patient identified Patient Re-evaluated:Patient Re-evaluated prior to induction Oxygen Delivery Method: Non-rebreather mask

## 2019-06-11 NOTE — Progress Notes (Signed)
Dr Laural [name] notified of Hgb 11.2/Hct 36.5. No new orders given.

## 2019-06-11 NOTE — Anesthesia Postprocedure Evaluation (Signed)
Anesthesia Post Note  Patient: [name]  Procedure(s) Performed: COLONOSCOPY WITH PROPOFOL (N/A ) BIOPSY  Patient location during evaluation: PACU Anesthesia Type: General Level of consciousness: awake and alert and oriented Pain management: pain level controlled Vital Signs Assessment: post-procedure vital signs reviewed and stable Respiratory status: spontaneous breathing Cardiovascular status: stable Postop Assessment: no apparent nausea or vomiting Anesthetic complications: no     Last Vitals:  Vitals:   06/11/19 0705  BP: (!) 162/75  Pulse: 70  Resp: 18  SpO2: 97%    Last Pain:  Vitals:   06/11/19 0731  TempSrc:   PainSc: 0-No pain                 Aydee Mcnew A

## 2019-06-11 NOTE — Transfer of Care (Signed)
Immediate Anesthesia Transfer of Care Note  Patient: [name]  Procedure(s) Performed: COLONOSCOPY WITH PROPOFOL (N/A ) BIOPSY  Patient Location: PACU  Anesthesia Type:General  Level of Consciousness: awake, alert , oriented and patient cooperative  Airway & Oxygen Therapy: Patient Spontanous Breathing  Post-op Assessment: Report given to RN and Post -op Vital signs reviewed and stable  Post vital signs: Reviewed and stable  Last Vitals:  Vitals Value Taken Time  BP 119/55 06/11/19 0801  Temp    Pulse 72 06/11/19 0802  Resp 20 06/11/19 0802  SpO2 96 % 06/11/19 0802  Vitals shown include unvalidated device data.  Last Pain:  Vitals:   06/11/19 0731  TempSrc:   PainSc: 0-No pain         Complications: No apparent anesthesia complications

## 2019-06-11 NOTE — Op Note (Signed)
[facility] Patient Name: [name] Procedure Date: 06/11/2019 7:02 AM MRN: [id number] Date of Birth: 05-Feb-1946 Attending MD: [name] , MD CSN: [id number] Age: 73 Admit Type: Outpatient Procedure:                Colonoscopy Indications:              Iron deficiency anemia secondary to chronic blood                            loss, Chronic ulcerative pancolitis Providers:                [name], MD, Otis Peak B. Sharon Seller, RN, Raphael Gibney, Technician Referring MD:             Tammi Sou, MD Medicines:                Propofol per Anesthesia Complications:            No immediate complications. Estimated Blood Loss:     Estimated blood loss was minimal. Procedure:                Pre-Anesthesia Assessment:                           - Prior to the procedure, a History and Physical                            was performed, and patient medications and                            allergies were reviewed. The patient's tolerance of                            previous anesthesia was also reviewed. The risks                            and benefits of the procedure and the sedation                            options and risks were discussed with the patient.                            All questions were answered, and informed consent                            was obtained. Prior Anticoagulants: The patient                            last took Xarelto (rivaroxaban) 3 days prior to the                            procedure. ASA Grade Assessment: III - A patient  with severe systemic disease. After reviewing the                            risks and benefits, the patient was deemed in                            satisfactory condition to undergo the procedure.                           After obtaining informed consent, the colonoscope                            was passed under direct vision. Throughout the             procedure, the patient's blood pressure, pulse, and                            oxygen saturations were monitored continuously. The                            [id number]-[id number] ([id number]) scope was introduced through                            the anus and advanced to the the cecum, identified                            by appendiceal orifice and ileocecal valve. The                            colonoscopy was performed without difficulty. The                            patient tolerated the procedure well. The quality                            of the bowel preparation was excellent. The                            ileocecal valve, appendiceal orifice, and rectum                            were photographed. Scope In: 8:34:19 AM Scope Out: 7:57:30 AM Scope Withdrawal Time: 0 hours 13 minutes 14 seconds  Total Procedure Duration: 0 hours 21 minutes 16 seconds  Findings:      Skin tags were found on perianal exam.      The digital rectal exam was normal.      A few localized non-bleeding erosions were found at the appendiceal       orifice. Biopsies were taken with a cold forceps for histology. The       pathology specimen was placed into Bottle Number 1.      An area of mildly congested mucosa was found in the ascending colon.       Biopsies were taken with a cold forceps for histology. The pathology       specimen was placed  into Bottle Number 2.      Normal mucosa was found in the sigmoid colon. Biopsies were taken with a       cold forceps for histology. The pathology specimen was placed into       Bottle Number 3.      A few small-mouthed diverticula were found in the sigmoid colon.      No additional abnormalities were found on retroflexion. Impression:               - Perianal skin tags found on perianal exam.                           - A few punctate erosions at the appendiceal                            orifice. Biopsied.                           - Congested mucosa in the  ascending colon. Biopsied.                           - Normal mucosa in the sigmoid colon. Biopsied.                           - Diverticulosis in the sigmoid colon. Moderate Sedation:      Per Anesthesia Care Recommendation:           - Patient has a contact number available for                            emergencies. The signs and symptoms of potential                            delayed complications were discussed with the                            patient. Return to normal activities tomorrow.                            Written discharge instructions were provided to the                            patient.                           - Resume previous diet today.                           - Continue present medications.                           - Resume Xarelto (rivaroxaban) at prior dose                            tomorrow.                           -  Await pathology results.                           - H/H today.                           - Repeat colonoscopy in 5 years for surveillance. Procedure Code(s):        --- Professional ---                           [phone], Colonoscopy, flexible; with biopsy, single                            or multiple Diagnosis Code(s):        --- Professional ---                           K63.3, Ulcer of intestine                           K63.89, Other specified diseases of intestine                           K64.4, Residual hemorrhoidal skin tags                           D50.0, Iron deficiency anemia secondary to blood                            loss (chronic)                           K51.00, Ulcerative (chronic) pancolitis without                            complications                           K57.30, Diverticulosis of large intestine without                            perforation or abscess without bleeding CPT copyright 2019 American Medical Association. All rights reserved. The codes documented in this report are preliminary and upon  coder review may  be revised to meet current compliance requirements. [name], MD [name], MD 06/11/2019 8:08:54 AM This report has been signed electronically. Number of Addenda: 0

## 2019-06-15 ENCOUNTER — Encounter (HOSPITAL_COMMUNITY): Payer: Self-pay | Admitting: Internal Medicine

## 2019-06-16 ENCOUNTER — Encounter: Payer: Self-pay | Admitting: Family Medicine

## 2019-06-21 ENCOUNTER — Other Ambulatory Visit (INDEPENDENT_AMBULATORY_CARE_PROVIDER_SITE_OTHER): Payer: Self-pay | Admitting: *Deleted

## 2019-06-21 DIAGNOSIS — K5100 Ulcerative (chronic) pancolitis without complications: Secondary | ICD-10-CM

## 2019-06-21 DIAGNOSIS — D509 Iron deficiency anemia, unspecified: Secondary | ICD-10-CM

## 2019-06-21 NOTE — Progress Notes (Signed)
cbc

## 2019-06-22 ENCOUNTER — Telehealth: Payer: Self-pay | Admitting: Obstetrics & Gynecology

## 2019-06-22 LAB — HM DIABETES EYE EXAM

## 2019-06-22 NOTE — Telephone Encounter (Signed)
Per [facility] patient has not responded for scheduling. Okay to close the referral per Dr. Sabra Heck.

## 2019-06-23 ENCOUNTER — Encounter: Payer: Self-pay | Admitting: Family Medicine

## 2019-07-01 ENCOUNTER — Encounter: Payer: Self-pay | Admitting: Dietician

## 2019-07-01 ENCOUNTER — Other Ambulatory Visit: Payer: Self-pay

## 2019-07-01 ENCOUNTER — Encounter: Payer: Medicare Other | Attending: Surgery | Admitting: Dietician

## 2019-07-01 DIAGNOSIS — E1169 Type 2 diabetes mellitus with other specified complication: Secondary | ICD-10-CM | POA: Insufficient documentation

## 2019-07-01 DIAGNOSIS — I10 Essential (primary) hypertension: Secondary | ICD-10-CM | POA: Insufficient documentation

## 2019-07-01 DIAGNOSIS — E669 Obesity, unspecified: Secondary | ICD-10-CM | POA: Diagnosis not present

## 2019-07-01 NOTE — Progress Notes (Signed)
Bariatric Supervised Weight Loss Visit Appt Start Time: 2:00pm   End Time: 2:45pm  Planned Surgery: Sleeve   2nd out of 6 SWL Appointments    NUTRITION ASSESSMENT  Anthropometrics  Start weight at [facility]: 226 lbs  Today's weight: 228.5 lbs Weight change: +2.5 lbs (since previous visit on 06/01/2019) BMI: 43.2 kg/m2    Clinical  Medical Hx: diabetes, obesity Medications: see list   Lifestyle & Dietary Hx Pt's husband is supportive. States she is used to taking care of everyone else and does not know how to focus on herself. Pt admits to food being her comfort and has some anxiety and feels overwhelmed about making lifestyle changes especially among stress.   Evening schedule is typically eat dinner, have a snack around 8 or 9pm (such as apple with peanut butter or popcorn), go to bed around 2 or 3am, then wake up and check blood sugar around 9am.   Checks her blood sugar daily, fasting and in the evening (sometimes 2 hours after dinner as well.) Fasting is usually between 60 and 120. Nighttime readings are up to 235 lately. Asked questions about how to prevent low fasting numbers.   States that over the past month she has worked on eating smaller portion sizes, choosing better snacks, and checking blood sugar twice per day. Patient was confused about balanced meals and asked if making healthy food choices is something she needs to be doing now this far out from surgery. Seemed overwhelmed upon arrival, so we clarified what it means to put together balanced meals (revisited the Meal Ideas sheet she was provided last time) and snacks.   Patient requires thorough explanation and benefits from small changes and goals at a time. She asked various questions such as how she compares to other patients asking if she is "on the high end or low end" as far as her weight.   Estimated Energy Needs Calories: 1600 Carbohydrate: 180g Protein: 100g Fat: 53g   NUTRITION DIAGNOSIS  Overweight/obesity  ([place]-3.3) related to past poor dietary habits and physical inactivity as evidenced by patient w/ planned Sleeve Gastrectomy surgery following dietary guidelines for continued weight loss.   NUTRITION INTERVENTION  Nutrition counseling (C-1) and education (E-2) to facilitate bariatric surgery goals.  Pre-Op Goals Progress & New Goals . Logging food and beverage intake  . Checking blood sugar (fasting and at nighttime, not checking 2 hours after meals)  . Choosing healthier snacks  . Working on increasing fluid intake   Handouts Provided Include   Bariatric Sugary Drinks   MyPlate  Learning Style & Readiness for Change Teaching method utilized: Visual & Auditory  Demonstrated degree of understanding via: Teach Back  Barriers to learning/adherence to lifestyle change: Contemplative Stage of Change  RD's Notes for next Visit  . Vitamins & Minerals (per pt request)    MONITORING & EVALUATION Dietary intake, weekly physical activity, body weight, and pre-op goals in 1 month.   Next Steps  Patient is to return to [facility] in 1 month for 3rd SWL visit.

## 2019-07-22 ENCOUNTER — Telehealth: Payer: Self-pay | Admitting: Pulmonary Disease

## 2019-07-22 ENCOUNTER — Other Ambulatory Visit: Payer: Self-pay | Admitting: Family Medicine

## 2019-07-22 NOTE — Telephone Encounter (Signed)
Call returned to patient, confirmed DOB, requesting cpap supplies. Made aware this will require an OV. Appt made. Nothing further needed at this time.

## 2019-07-23 ENCOUNTER — Other Ambulatory Visit: Payer: Self-pay

## 2019-07-23 ENCOUNTER — Ambulatory Visit (INDEPENDENT_AMBULATORY_CARE_PROVIDER_SITE_OTHER): Payer: Medicare Other | Admitting: Primary Care

## 2019-07-23 ENCOUNTER — Encounter: Payer: Self-pay | Admitting: Primary Care

## 2019-07-23 DIAGNOSIS — G4733 Obstructive sleep apnea (adult) (pediatric): Secondary | ICD-10-CM | POA: Diagnosis not present

## 2019-07-23 NOTE — Patient Instructions (Signed)
It was a pleasure speaking with you today  Recommendations: Continue CPAP every night, aim to wear 4 to 6 hours or more Do not drive if experiencing excessive daytime fatigue or somnolence  Orders: Change CPAP pressure 5-13cm H20 Renew CPAP supplies, mask of choice  Follow-up: 1 year with Dr. Halford Chessman or sooner if needed    CPAP and BPAP Information CPAP and BPAP are methods of helping a person breathe with the use of air pressure. CPAP stands for "continuous positive airway pressure." BPAP stands for "bi-level positive airway pressure." In both methods, air is blown through your nose or mouth and into your air passages to help you breathe well. CPAP and BPAP use different amounts of pressure to blow air. With CPAP, the amount of pressure stays the same while you breathe in and out. With BPAP, the amount of pressure is increased when you breathe in (inhale) so that you can take larger breaths. Your health care provider will recommend whether CPAP or BPAP would be more helpful for you. Why are CPAP and BPAP treatments used? CPAP or BPAP can be helpful if you have:  Sleep apnea.  Chronic obstructive pulmonary disease (COPD).  Heart failure.  Medical conditions that weaken the muscles of the chest including muscular dystrophy, or neurological diseases such as amyotrophic lateral sclerosis (ALS).  Other problems that cause breathing to be weak, abnormal, or difficult. CPAP is most commonly used for obstructive sleep apnea (OSA) to keep the airways from collapsing when the muscles relax during sleep. How is CPAP or BPAP administered? Both CPAP and BPAP are provided by a small machine with a flexible plastic tube that attaches to a plastic mask. You wear the mask. Air is blown through the mask into your nose or mouth. The amount of pressure that is used to blow the air can be adjusted on the machine. Your health care provider will determine the pressure setting that should be used based on your  individual needs. When should CPAP or BPAP be used? In most cases, the mask only needs to be worn during sleep. Generally, the mask needs to be worn throughout the night and during any daytime naps. People with certain medical conditions may also need to wear the mask at other times when they are awake. Follow instructions from your health care provider about when to use the machine. What are some tips for using the mask?   Because the mask needs to be snug, some people feel trapped or closed-in (claustrophobic) when first using the mask. If you feel this way, you may need to get used to the mask. One way to do this is by holding the mask loosely over your nose or mouth and then gradually applying the mask more snugly. You can also gradually increase the amount of time that you use the mask.  Masks are available in various types and sizes. Some fit over your mouth and nose while others fit over just your nose. If your mask does not fit well, talk with your health care provider about getting a different one.  If you are using a mask that fits over your nose and you tend to breathe through your mouth, a chin strap may be applied to help keep your mouth closed.  The CPAP and BPAP machines have alarms that may sound if the mask comes off or develops a leak.  If you have trouble with the mask, it is very important that you talk with your health care provider about  finding a way to make the mask easier to tolerate. Do not stop using the mask. Stopping the use of the mask could have a negative impact on your health. What are some tips for using the machine?  Place your CPAP or BPAP machine on a secure table or stand near an electrical outlet.  Know where the on/off switch is located on the machine.  Follow instructions from your health care provider about how to set the pressure on your machine and when you should use it.  Do not eat or drink while the CPAP or BPAP machine is on. Food or fluids could  get pushed into your lungs by the pressure of the CPAP or BPAP.  Do not smoke. Tobacco smoke residue can damage the machine.  For home use, CPAP and BPAP machines can be rented or purchased through home health care companies. Many different brands of machines are available. Renting a machine before purchasing may help you find out which particular machine works well for you.  Keep the CPAP or BPAP machine and attachments clean. Ask your health care provider for specific instructions. Get help right away if:  You have redness or open areas around your nose or mouth where the mask fits.  You have trouble using the CPAP or BPAP machine.  You cannot tolerate wearing the CPAP or BPAP mask.  You have pain, discomfort, and bloating in your abdomen. Summary  CPAP and BPAP are methods of helping a person breathe with the use of air pressure.  Both CPAP and BPAP are provided by a small machine with a flexible plastic tube that attaches to a plastic mask.  If you have trouble with the mask, it is very important that you talk with your health care provider about finding a way to make the mask easier to tolerate. This information is not intended to replace advice given to you by your health care provider. Make sure you discuss any questions you have with your health care provider. Document Released: 06/14/2004 Document Revised: 01/06/2019 Document Reviewed: 08/05/2016 Elsevier Patient Education  2020 [employer].

## 2019-07-23 NOTE — Progress Notes (Signed)
Virtual Visit via Telephone Note  I connected with [name] on 07/23/19 at 11:00 AM EDT by telephone and verified that I am speaking with the correct person using two identifiers.  Location: Patient: Home Provider: Office   I discussed the limitations, risks, security and privacy concerns of performing an evaluation and management service by telephone and the availability of in person appointments. I also discussed with the patient that there may be a patient responsible charge related to this service. The patient expressed understanding and agreed to proceed.   History of Present Illness: 73 year old female, never smoked. PMH significant for OSA, allergic sinusitis, hypertension, afib, type 2 diabetes. Patient of Dr. Halford Chessman, last seen January 2018. PSG 09/07/15 >> AHI 72, SpO2 low 75%. Maintained on CPAP auto titrate 5-15cm h20.    07/23/2019 Patient contacted today for televisit, needs CPAP supplies renewed. She is doing well, tolerating CPAP therapy with no significant issues. Reports pressure is a little too strong at times. She would also like to change mask d/t airleaks. She gets new supplies every 6 months. Sleeps on her side, states that she wakes up and has to adjust her head gear. She is sleeping very well at night and notices improvement in energy level and daytime fatigue. She is experiencing nose bleeds in the morning 2-3 times a week, she was unaware that she could adjust humidification setting on CPAP. She is on a blood thinner.   [facility] download 9/23-10/22/20: 30/30 days; 100% > 4 hours Average usage 7 hours 15 mins Pressure 5-15cm with median 7.2 (10.6cm- 95%) Air leaks 6.2L/min (29.4- 95%) AHI 1.5  Observations/Objective:  - No shortness of breath, wheezing or cough  Assessment and Plan:  OSA: - Patient is 100% compliant with CPAP and benefit from use - Average AHI 1.5 with median pressure 7.2  - Change pressure to 5-13cm H20  - Renew CPAP supplies with mask of  choice - Aim to wear CPAP every night 4-6 hours or more, instructed not to drive if experiencing excessive fatigue - FU in 1 year or sooner if needed  Epistaxis - Experiences 2-3 times a week in morning  - Advised increasing humidification on CPAP  - She can also try using ocean nasal spray/nasal gel before bedtime   Follow Up Instructions:   - 1 YEAR   I discussed the assessment and treatment plan with the patient. The patient was provided an opportunity to ask questions and all were answered. The patient agreed with the plan and demonstrated an understanding of the instructions.   The patient was advised to call back or seek an in-person evaluation if the symptoms worsen or if the condition fails to improve as anticipated.  I provided 22 minutes of non-face-to-face time during this encounter.   Martyn Ehrich, NP

## 2019-07-23 NOTE — Progress Notes (Signed)
Reviewed and agree with assessment/plan.   Kilani Joffe, MD [facility] 09/25/2016, 12:24 PM Pager:  [phone]  

## 2019-07-27 ENCOUNTER — Ambulatory Visit (INDEPENDENT_AMBULATORY_CARE_PROVIDER_SITE_OTHER): Payer: Medicare Other | Admitting: Internal Medicine

## 2019-07-27 ENCOUNTER — Other Ambulatory Visit: Payer: Self-pay

## 2019-07-27 ENCOUNTER — Encounter (INDEPENDENT_AMBULATORY_CARE_PROVIDER_SITE_OTHER): Payer: Self-pay | Admitting: Internal Medicine

## 2019-07-27 ENCOUNTER — Other Ambulatory Visit (INDEPENDENT_AMBULATORY_CARE_PROVIDER_SITE_OTHER): Payer: Self-pay | Admitting: Internal Medicine

## 2019-07-27 VITALS — BP 127/67 | HR 68 | Temp 98.1°F | Ht 61.0 in | Wt 228.1 lb

## 2019-07-27 DIAGNOSIS — K219 Gastro-esophageal reflux disease without esophagitis: Secondary | ICD-10-CM

## 2019-07-27 DIAGNOSIS — D500 Iron deficiency anemia secondary to blood loss (chronic): Secondary | ICD-10-CM

## 2019-07-27 DIAGNOSIS — K5100 Ulcerative (chronic) pancolitis without complications: Secondary | ICD-10-CM

## 2019-07-27 NOTE — Patient Instructions (Addendum)
Do not take iron pills until small bowel study completed. Physician will call with results of blood work

## 2019-07-27 NOTE — Progress Notes (Signed)
Presenting complaint;  Follow-up for ulcerative colitis and iron deficiency anemia. History of GERD and fatty liver.  Database and subjective:  Patient is 73 year old Caucasian female who has close to 30-year history of ulcerative colitis who has been on infliximab for the past few years and has remained in remission.  She also has chronic GERD diarrhea felt to be either due to IBS or diabetes as well as history of elevated transaminases secondary to fatty liver.  She has not had any luck losing weight.  She has seen Dr. Alphonsa Overall of [facility] surgery for bariatric surgery and she is in the process of being evaluated. Back in July this year she was found to have hemoglobin of 8.1 g.  Her hemoglobin was 12.6 in December 2019.  She was confirmed to have iron deficiency anemia.  She had 3 Hemoccults and one was positive.  She underwent colonoscopy on 06/11/2019. She had few punctate periappendiceal mucosa and edema to ascending colon.  Biopsy from these areas revealed focal subacute colitis but no changes of chronicity.  Random biopsies from normal-appearing mucosa of sigmoid colon were negative for microscopic or lymphocytic colitis.  She also had sigmoid diverticulosis.  She received iron infusion prior to her colonoscopy and now has been maintained on p.o. iron. Her hemoglobin on 06/11/2019 was 11.2.  She has no complaints.  She feels current dose of Nexium is controlling her heartburn very well.  She denies dysphagia nausea or vomiting.  She remains with diarrhea.  She has 3-6 stools per day.  She never has nocturnal bowel movement.  Consistency varies from formed to loose stools.  She says for stool is always formed and subsequent stools are semiformed to loose.  She tends to have more diarrhea when she eats at a restaurant.  She does much better when she is at home.  She denies melena or frank rectal bleeding.  She may see blood in the tissue sometimes.  This occurs when she has had more  diarrhea.  She denies hematuria or vaginal bleeding. She has booklet which details various forms of surgeries for obesity.  She is concerned that her diarrhea might worsen after surgery.  I told her that Roux-en-Y procedure probably would worsen her diarrhea and she is less likely to have any problems with sleeve gastrectomy but final decision will will be left up to Dr. Alphonsa Overall and patient.    Current Medications: Outpatient Encounter Medications as of 07/27/2019  Medication Sig  . acetaminophen (TYLENOL) 500 MG tablet Take 1,000 mg by mouth every 6 (six) hours as needed for moderate pain or headache.  . citalopram (CELEXA) 20 MG tablet TAKE 1 TABLET BY MOUTH EVERY DAY (Patient taking differently: Take 20 mg by mouth at bedtime. )  . Cyanocobalamin (B-12) 5000 MCG CAPS Take 5,000 mcg by mouth daily.  Marland Kitchen dicyclomine (BENTYL) 10 MG capsule TAKE 1 CAPSULE (10 MG TOTAL) BY MOUTH 3 (THREE) TIMES DAILY BEFORE MEALS.  Marland Kitchen esomeprazole (NEXIUM) 20 MG capsule Take 20 mg by mouth daily before breakfast.   . furosemide (LASIX) 40 MG tablet TAKE 1 TABLET BY MOUTH EVERY DAY (Patient taking differently: Take 40 mg by mouth daily. )  . gabapentin (NEURONTIN) 300 MG capsule 1 cap po bid (to be taken with a 600 mg gabapentin tab at each dose) (Patient taking differently: Take 300 mg by mouth 2 (two) times daily. Take with 600 mg to equal 900 mg twice daily)  . gabapentin (NEURONTIN) 600 MG tablet Take 1  tablet (600 mg total) by mouth 2 (two) times daily. (each dose is to be taken with a 300 mg gabapentin pill (Patient taking differently: Take 600 mg by mouth 2 (two) times daily. Take with 300 mg to equal 900 mg twice daily)  . glucose blood (ONE TOUCH ULTRA TEST) test strip Use as instructed to check sugar 6 times daily  . hydrocortisone (PROCTOSOL HC) 2.5 % rectal cream Place 1 application rectally 2 (two) times daily. (Patient taking differently: Place 1 application rectally 2 (two) times daily as needed for  anal itching. )  . inFLIXimab (REMICADE) 100 MG injection Inject into the vein every 8 (eight) weeks.   . Insulin Pen Needle (B-D UF III MINI PEN NEEDLES) 31G X 5 MM MISC USE TO INJECT INSULINS EQUAL TO 6 TIMES DAILY.  Marland Kitchen insulin regular human CONCENTRATED (HUMULIN R U-500 KWIKPEN) 500 UNIT/ML kwikpen Inject 123U subcutaneously at breakfast, 133U mid-day. 117U at supper. If sugar is greater than 200 at bedtime inject an additional 25U. (Patient taking differently: Inject 25-135 Units into the skin See admin instructions. Inject 125 units subcutaneously at breakfast, 135 units at lunch, and 125 units at supper. If sugar is greater than 200 at bedtime inject an additional 25U.)  . KLOR-CON M10 10 MEQ tablet TAKE 2 TABLETS (20 MEQ TOTAL) BY MOUTH DAILY. -- OFFICE VISIT NEEDED FOR FURTHER REFILLS (Patient taking differently: Take 10 mEq by mouth 2 (two) times daily. )  . lactase (LACTAID) 3000 UNITS tablet Take 3,000 Units by mouth as needed (when eating foods containing dairy).   . Lancets (ONETOUCH DELICA PLUS ASTMHD62I) MISC USE AS DIRECTED TO CHECK BLOOD SUGAR UP TO 6 TIMES PER DAY. DX: E11.09  . loperamide (IMODIUM) 2 MG capsule Take 2-4 mg by mouth as needed for diarrhea or loose stools.   . meclizine (ANTIVERT) 25 MG tablet Take 1 tablet (25 mg total) by mouth 3 (three) times daily as needed for dizziness or nausea.  . metoprolol succinate (TOPROL-XL) 100 MG 24 hr tablet TAKE 1 TABLET BY MOUTH 2 TIMES DAILY. TAKE WITH OR IMMEDIATELY FOLLOWING A MEAL  . Pediatric Multivitamins-Iron (FLINTSTONES PLUS IRON) chewable tablet Chew 1 tablet by mouth 2 (two) times daily. (Patient taking differently: Chew 2 tablets by mouth at bedtime. )  . rivaroxaban (XARELTO) 20 MG TABS tablet Take 1 tablet (20 mg total) by mouth.  . TRULICITY 1.5 WL/7.9GX SOPN INJECT UNDER SKIN 1.5 MG WEEKLY UNDER SKIN. (Patient taking differently: Inject 1.5 mg as directed every Thursday. )  . [DISCONTINUED] amitriptyline (ELAVIL) 25 MG  tablet Take 1 tablet (25 mg total) by mouth at bedtime. start with 1/2 tab at night for 1 week then increase to 1 whole tab.  . [DISCONTINUED] bromocriptine (PARLODEL) 2.5 MG tablet Take 2.5 mg by mouth 2 (two) times daily.    . [DISCONTINUED] potassium chloride (K-DUR) 10 MEQ tablet Take 2 tablets (20 mEq total) by mouth daily.   No facility-administered encounter medications on file as of 07/27/2019.      Objective: Blood pressure 127/67, pulse 68, temperature 98.1 F (36.7 C), temperature source Oral, height 5' 1"  (1.549 m), weight 228 lb 1.6 oz (103.5 kg). Patient is alert and in no acute distress. Patient is wearing facial mask. Conjunctiva is pink. Sclera is nonicteric Oropharyngeal mucosa is normal. No neck masses or thyromegaly noted. Cardiac exam with regular rhythm normal S1 and S2. No murmur or gallop noted. Lungs are clear to auscultation. Abdomen is protuberant.  Soft and nontender  with organomegaly or masses. She does not have clubbing or koilonychia. She has nonpitting edema to both legs.  Labs/studies Results:  CBC Latest Ref Rng & Units 06/11/2019 05/27/2019 04/27/2019  WBC 4.0 - 10.5 K/uL - - 4.0  Hemoglobin 12.0 - 15.0 g/dL 11.2(L) 10.0(L) 8.1(L)  Hematocrit 36.0 - 46.0 % 36.5 34.1(L) 26.2(L)  Platelets 150.0 - 400.0 K/uL - - 139.0(L)    CMP Latest Ref Rng & Units 04/27/2019 10/15/2018 06/29/2018  Glucose 70 - 99 mg/dL 177(H) 137(H) 164(H)  BUN 6 - 23 mg/dL 12 10 14   Creatinine 0.40 - 1.20 mg/dL 0.72 0.66 0.65  Sodium 135 - 145 mEq/L 139 140 137  Potassium 3.5 - 5.1 mEq/L 3.9 3.7 4.0  Chloride 96 - 112 mEq/L 103 104 100  CO2 19 - 32 mEq/L 28 30 29   Calcium 8.4 - 10.5 mg/dL 8.7 8.7 9.1  Total Protein 6.0 - 8.3 g/dL 6.9 7.0 7.1  Total Bilirubin 0.2 - 1.2 mg/dL 0.7 0.6 0.7  Alkaline Phos 39 - 117 U/L 79 74 85  AST 0 - 37 U/L 46(H) 74(H) 124(H)  ALT 0 - 35 U/L 38(H) 59(H) 91(H)    Hepatic Function Latest Ref Rng & Units 04/27/2019 10/15/2018 06/29/2018  Total  Protein 6.0 - 8.3 g/dL 6.9 7.0 7.1  Albumin 3.5 - 5.2 g/dL 3.5 3.5 3.5  AST 0 - 37 U/L 46(H) 74(H) 124(H)  ALT 0 - 35 U/L 38(H) 59(H) 91(H)  Alk Phosphatase 39 - 117 U/L 79 74 85  Total Bilirubin 0.2 - 1.2 mg/dL 0.7 0.6 0.7  Bilirubin, Direct 0.0 - 0.3 mg/dL - - -    Lab Results  Component Value Date   CRP <0.5 03/04/2014      Assessment:  #1.  Chronic ulcerative colitis.  Disease duration approximately 30 years.  Colonoscopy 6 weeks ago revealed her to be in remission while on infliximab infusion every 8 weeks.  #2.  Iron deficiency anemia.  While she was noted to have heme positive stools I suspect iron deficiency anemia is due to impaired iron absorption since she has been on chronic acid suppression.  She was screened for celiac disease in 2016 and was negative.  EGD in 2016 did not reveal duodenal changes to suggest celiac disease.  Since she is considering bariatric surgery it would be appropriate to reevaluate her small bowel with given capsule.  #3.  Chronic diarrhea.  I suspect she has IBS or diabetic diarrhea.  I would favor IBS as she has much less diarrhea when she is at home and more so when she eats at a restaurant.  She will continue low-dose Imodium and dicyclomine which she is tolerating well.  #4.  Chronic GERD.  She is doing well with antireflux measures and low-dose PPI.  #5. Elevated transaminases felt to be due to fatty liver.  Ultrasound in November 2019 raised the possibility of cirrhosis.  She does not have any other markers such as thrombocytopenia or splenomegaly.  I would request Dr. Alphonsa Overall if she could have liver biopsy at the time of bariatric surgery.   Plan:  Patient will go to the lab for CBC. Schedule Given capsule study next week. Patient advised to hold p.o. iron until small bowel study completed. Office visit in 6 months.

## 2019-07-28 ENCOUNTER — Other Ambulatory Visit (INDEPENDENT_AMBULATORY_CARE_PROVIDER_SITE_OTHER): Payer: Self-pay | Admitting: *Deleted

## 2019-07-28 DIAGNOSIS — D500 Iron deficiency anemia secondary to blood loss (chronic): Secondary | ICD-10-CM | POA: Insufficient documentation

## 2019-08-01 ENCOUNTER — Encounter: Payer: Self-pay | Admitting: Family Medicine

## 2019-08-03 ENCOUNTER — Ambulatory Visit (HOSPITAL_COMMUNITY)
Admission: RE | Admit: 2019-08-03 | Discharge: 2019-08-03 | Disposition: A | Discharge status: Home / Self Care | Payer: Medicare Other | Attending: Internal Medicine | Admitting: Internal Medicine

## 2019-08-03 ENCOUNTER — Encounter (HOSPITAL_COMMUNITY)
Admission: RE | Disposition: A | Discharge status: Home / Self Care | Payer: Self-pay | Source: Home / Self Care | Attending: Internal Medicine

## 2019-08-03 DIAGNOSIS — Z881 Allergy status to other antibiotic agents status: Secondary | ICD-10-CM | POA: Diagnosis not present

## 2019-08-03 DIAGNOSIS — Z885 Allergy status to narcotic agent status: Secondary | ICD-10-CM | POA: Diagnosis not present

## 2019-08-03 DIAGNOSIS — K219 Gastro-esophageal reflux disease without esophagitis: Secondary | ICD-10-CM | POA: Insufficient documentation

## 2019-08-03 DIAGNOSIS — K7581 Nonalcoholic steatohepatitis (NASH): Secondary | ICD-10-CM | POA: Diagnosis not present

## 2019-08-03 DIAGNOSIS — M1990 Unspecified osteoarthritis, unspecified site: Secondary | ICD-10-CM | POA: Insufficient documentation

## 2019-08-03 DIAGNOSIS — K5190 Ulcerative colitis, unspecified, without complications: Secondary | ICD-10-CM | POA: Diagnosis not present

## 2019-08-03 DIAGNOSIS — Z91048 Other nonmedicinal substance allergy status: Secondary | ICD-10-CM | POA: Insufficient documentation

## 2019-08-03 DIAGNOSIS — D500 Iron deficiency anemia secondary to blood loss (chronic): Secondary | ICD-10-CM | POA: Diagnosis not present

## 2019-08-03 DIAGNOSIS — E119 Type 2 diabetes mellitus without complications: Secondary | ICD-10-CM | POA: Diagnosis not present

## 2019-08-03 DIAGNOSIS — Z888 Allergy status to other drugs, medicaments and biological substances status: Secondary | ICD-10-CM | POA: Diagnosis not present

## 2019-08-03 DIAGNOSIS — Z8249 Family history of ischemic heart disease and other diseases of the circulatory system: Secondary | ICD-10-CM | POA: Diagnosis not present

## 2019-08-03 DIAGNOSIS — M797 Fibromyalgia: Secondary | ICD-10-CM | POA: Diagnosis not present

## 2019-08-03 DIAGNOSIS — E739 Lactose intolerance, unspecified: Secondary | ICD-10-CM | POA: Insufficient documentation

## 2019-08-03 DIAGNOSIS — Z833 Family history of diabetes mellitus: Secondary | ICD-10-CM | POA: Insufficient documentation

## 2019-08-03 DIAGNOSIS — E785 Hyperlipidemia, unspecified: Secondary | ICD-10-CM | POA: Diagnosis not present

## 2019-08-03 DIAGNOSIS — G4733 Obstructive sleep apnea (adult) (pediatric): Secondary | ICD-10-CM | POA: Diagnosis not present

## 2019-08-03 DIAGNOSIS — Z800 Family history of malignant neoplasm of digestive organs: Secondary | ICD-10-CM | POA: Insufficient documentation

## 2019-08-03 DIAGNOSIS — Z7901 Long term (current) use of anticoagulants: Secondary | ICD-10-CM | POA: Insufficient documentation

## 2019-08-03 DIAGNOSIS — Z91013 Allergy to seafood: Secondary | ICD-10-CM | POA: Insufficient documentation

## 2019-08-03 DIAGNOSIS — Z9049 Acquired absence of other specified parts of digestive tract: Secondary | ICD-10-CM | POA: Diagnosis not present

## 2019-08-03 DIAGNOSIS — Z811 Family history of alcohol abuse and dependence: Secondary | ICD-10-CM | POA: Diagnosis not present

## 2019-08-03 DIAGNOSIS — I10 Essential (primary) hypertension: Secondary | ICD-10-CM | POA: Diagnosis not present

## 2019-08-03 DIAGNOSIS — I480 Paroxysmal atrial fibrillation: Secondary | ICD-10-CM | POA: Diagnosis not present

## 2019-08-03 DIAGNOSIS — Z825 Family history of asthma and other chronic lower respiratory diseases: Secondary | ICD-10-CM | POA: Diagnosis not present

## 2019-08-03 DIAGNOSIS — Z794 Long term (current) use of insulin: Secondary | ICD-10-CM | POA: Insufficient documentation

## 2019-08-03 DIAGNOSIS — Z90710 Acquired absence of both cervix and uterus: Secondary | ICD-10-CM | POA: Insufficient documentation

## 2019-08-03 HISTORY — PX: GIVENS CAPSULE STUDY: SHX5432

## 2019-08-03 SURGERY — IMAGING PROCEDURE, GI TRACT, INTRALUMINAL, VIA CAPSULE

## 2019-08-03 MED ORDER — SODIUM CHLORIDE 0.9 % IV SOLN: INTRAVENOUS | Status: DC

## 2019-08-03 NOTE — H&P (Signed)
[name] is an 73 y.o. female.   Chief Complaint: Patient is here for small bowel given capsule study. HPI: Patient is 73 year old Caucasian female who has chronic ulcerative colitis in remission on biologic who also has chronic diarrhea felt to be due to IBS and or diabetic diarrhea who presented with iron deficiency anemia and heme positive stool.  She did not experience rectal bleeding or melena.  She has chronic GERD and symptoms are well controlled with low-dose PPI.  She underwent colonoscopy on 06/11/2019 and no lesion was found to account for GI blood loss.  Patient is considering bariatric surgery.  Patient's last EGD was in November 2016 with insignificant findings.  I recommended small bowel given capsule study to rule out bleeding source from small bowel.  While the study is dedicated for small bowel but it would provide opportunity to also examine her stomach.  Past Medical History:  Diagnosis Date  . Anxiety   . 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 Cirrhosis, nonalcoholic ([place]) [id number]/[id number]   NASH--> early cirrhotic changes on ultrasound 07/2018. ? to get liver bx if she gets bariatric surgery?  . Dietary iron deficiency without anemia 12/2018   Possibly inadequate absorption secondary to chronic/long term PPI therapy.  flintstone vitamin bid started end of april 2020.  Colonoscopy ok 2020.  Capsule endoscopy ->set for 08/03/19.  . Fibromyalgia   . GERD   . History of thrombocytopenia 12/2011  . Hyperlipidemia    Intolerant of statins  . HYPERTENSION   . IBS (irritable bowel syndrome)    -D.  Good response to bentyl and imodium as of 06/2018 GI f/u.  Marland Kitchen IDDM (insulin dependent diabetes mellitus)    with DPN (managed by Dr. Cruzita Lederer but then in 2018 pt preferred to have me manage for her convenience)  . Limited mobility    Requires a walker for arthritic pain, widespread musculoskeletal pain, and neuropathic pain.  . Morbid obesity ([place])    As of 11/2018, pt  considering sleeve gastectomy vs bipass as of eval by Dr. Lucia Gaskins: next steps are labs/xrays, psych and nutrit consults, and cardiology clearance.  . Nonalcoholic steatohepatitis    Viral Hep screens NEG.  CT 2015.  Transaminasemia.  U/S 07/2018 showed early changes of cirrhosis.  . OSA (obstructive sleep apnea) 09/14/2015   sleep study 09/07/15: severe obstructive sleep apnea with an AHI of 72 and SaO2 low of 75%.>refer to sleep med for eval and tx options  . Osteoarthritis    hips, shoulders, knees  . PAF (paroxysmal atrial fibrillation) ([place])    One documented episode (after getting EGD 2016).  Was on amiodarone x 3 mo.  Rate control with metoprolol + anticoag with xarelto.   . Small fiber neuropathy    Due to DM.  Symmetric hands and feet tingling/numbness.  Marland Kitchen Ulcerative colitis ([place])    Remicade infusion Q 8 weeks: in clinical and endoscopic remission as of 12/2018 GI f/u.  06/11/19 rpt colonoscopy->cecal and ascending colon colitis.    Past Surgical History:  Procedure Laterality Date  . ABDOMINAL HYSTERECTOMY  1980   Paps no longer indicated.  Marland Kitchen BACTERIAL OVERGROWTH TEST N/A 07/13/2015   Procedure: BACTERIAL OVERGROWTH TEST;  Surgeon: Rogene Houston, MD;  [facility];  Service: Endoscopy;  Laterality: N/A;  730    . BILATERAL SALPINGOOPHORECTOMY  02/10/2001  . BIOPSY  06/11/2019   Procedure: BIOPSY;  Surgeon: Rogene Houston, MD;  [facility];  Service: Endoscopy;;  colon  . BREAST REDUCTION SURGERY  1994   bilat  . CARDIOVASCULAR STRESS TEST  07/2010   Lexiscan myoview: normal  . CARPAL TUNNEL RELEASE Right 1996  . CARPAL TUNNEL RELEASE Left 03/21/2003  . CARPAL TUNNEL RELEASE Right 05/04/2013   Procedure: CARPAL TUNNEL RELEASE;  Surgeon: Cammie Sickle., MD;  Location: [facility];  Service: Orthopedics;  Laterality: Right;  . CARPAL TUNNEL RELEASE Left 09/21/2013   Procedure: LEFT CARPAL TUNNEL RELEASE;  Surgeon: Cammie Sickle., MD;   Location: [place];  Service: Orthopedics;  Laterality: Left;  . CHOLECYSTECTOMY    . COLONOSCOPY WITH PROPOFOL N/A 08/04/2015   Colitis in remission.  No polyps.  Procedure: COLONOSCOPY WITH PROPOFOL;  Surgeon: Rogene Houston, MD;  Location: [facility];  Service: Endoscopy;  Laterality: N/A;  cecum time in  0820   time out  0827    total time 7 minutes  . COLONOSCOPY WITH PROPOFOL N/A 06/11/2019   cecal and ascending colon colitis.  Procedure: COLONOSCOPY WITH PROPOFOL;  Surgeon: Rogene Houston, MD;  [facility];  Service: Endoscopy;  Laterality: N/A;  730a  . ESOPHAGEAL DILATION N/A 08/04/2015   Procedure: ESOPHAGEAL DILATION;  Surgeon: Rogene Houston, MD;  Location: [facility];  Service: Endoscopy;  Laterality: N/A;  [place] 57, no mucousal disruption  . ESOPHAGOGASTRODUODENOSCOPY (EGD) WITH ESOPHAGEAL DILATION  12/02/2005  . ESOPHAGOGASTRODUODENOSCOPY (EGD) WITH PROPOFOL N/A 08/04/2015   Procedure: ESOPHAGOGASTRODUODENOSCOPY (EGD) WITH PROPOFOL;  Surgeon: Rogene Houston, MD;  [facility];  Service: Endoscopy;  Laterality: N/A;  procedure 1  . FLEXIBLE SIGMOIDOSCOPY  01/17/2012   Procedure: FLEXIBLE SIGMOIDOSCOPY;  Surgeon: Rogene Houston, MD;  [facility];  Service: Endoscopy;  Laterality: N/A;  . HEMILAMINOTOMY LUMBAR SPINE Bilateral 09/07/1999   L4-5  . KNEE ARTHROSCOPY Right 01/1999; 10/2000  . LYSIS OF ADHESION  02/10/2001  . [place]; 09/12/2006  . TARSAL TUNNEL RELEASE  2002  . TRANSTHORACIC ECHOCARDIOGRAM  08/04/2015   EF 60-65%, normal wall motion, mild LVH, mild LA dilation, grd I DD.  Marland Kitchen TUMOR EXCISION Left 03/21/2003   dorsal 1st web space (hand)  . URETEROLYSIS Right 02/10/2001    Family History  Problem Relation Age of Onset  . Diabetes Mother   . Hypertension Mother   . Heart attack Father        Mid 106's  . Heart disease Father   . Lung disease Father        spot on lung; had lung surgery  . Alcohol abuse Other   .  Hypertension Son   . Diabetes Son   . Emphysema Maternal Grandfather   . Asthma Maternal Grandfather   . Colon cancer Paternal Grandfather   . Stomach cancer Paternal Grandmother   . Neuropathy Neg Hx    Social History:  reports that she has never smoked. She has never used smokeless tobacco. She reports current alcohol use. She reports that she does not use drugs.  Allergies:  Allergies  Allergen Reactions  . Omeprazole Anaphylaxis and Swelling    SWELLING OF TONGUE AND THROAT  . Actos [Pioglitazone] Other (See Comments)    Weight gain  . Benzocaine-Menthol Swelling    SWELLING OF MOUTH  . Colesevelam Other (See Comments)    GI UPSET  . Flagyl [Metronidazole Hcl] Other (See Comments)    DIAPHORESIS  . Metformin And Related Diarrhea  . Shrimp [  Shellfish Allergy] Itching    OF THROAT AND EARS  . Statins Other (See Comments)    HEART RACING  . Desipramine Hcl Itching, Nausea Only and Other (See Comments)    "swimmy" headed, ears itched   . Hydromorphone Itching  . Jardiance [Empagliflozin] Other (See Comments)    weakness  . Lactose Intolerance (Gi) Diarrhea    Gas, bloating  . Adhesive [Tape] Other (See Comments)    SKIN IRRITATION AND BRUISING  . Nisoldipine Itching  . Percocet [Oxycodone-Acetaminophen] Itching    No medications prior to admission.    No results found for this or any previous visit (from the past 48 hour(s)). No results found.  ROS  There were no vitals taken for this visit. Physical Exam   Assessment/Plan Iron deficiency anemia secondary to chronic blood loss. No lesions discovered on recent colonoscopy. Small bowel given capsule study.   [name], MD 08/03/2019, 8:00 AM

## 2019-08-06 ENCOUNTER — Encounter (HOSPITAL_COMMUNITY): Payer: Self-pay | Admitting: Internal Medicine

## 2019-08-06 NOTE — Op Note (Signed)
Small Bowel [place] Capsule Study Procedure date: 08/03/2019. Referring Provider: Dr. Tammi Sou. PCP:  Dr. Anitra Lauth, Adrian Blackwater, MD  Indication for procedure:    Patient is 73 year old Caucasian female with multiple medical problems including ulcerative colitis who was found to have iron deficiency anemia and heme positive stool.  She has chronic GERD and symptoms are well controlled with low-dose PPI.  She was therefore evaluated with colonoscopy.  No bleeding lesion was identified.  Therefore small bowel given capsule study was recommended. Patient is on Xarelto.  Findings:   Patient is able to swallow given capsule without any difficulty. Fresh blood noted coating gastric mucosa seen on two images at 000051.  There was no blood noted in stomach or small bowel. Food debris noted in the stomach and small bowel. No small bowel mucosal abnormalities noted although evaluation was several months suboptimal.   First Gastric image: 51 sec First Duodenal image: 22 min and 3 sec First Ileo-Cecal Valve image: 4 hrs 3 min and 12 sec First Cecal image: 4 hrs 3 min and 20 sec Gastric Passage time: 21 min Small Bowel Passage time: 3 hrs and 41 min  Summary & Recommendations:  Small collection of fresh blood noted coating the gastric mucosa seen on two images. Food debris in the stomach raising the possibility of gastroparesis.  Please note patient is diabetic.  Food debris also noted scattered through most of the small bowel.  Diagnostic esophagogastroduodenoscopy looking for source of bleeding in her stomach. Would consider solid-phase gastric emptying study which may impact choice of seizure that she would have for obesity.

## 2019-08-10 ENCOUNTER — Other Ambulatory Visit: Payer: Self-pay | Admitting: Family Medicine

## 2019-08-11 ENCOUNTER — Other Ambulatory Visit: Payer: Self-pay

## 2019-08-11 ENCOUNTER — Encounter: Payer: Medicare Other | Attending: Surgery | Admitting: Dietician

## 2019-08-11 ENCOUNTER — Encounter: Payer: Self-pay | Admitting: Dietician

## 2019-08-11 DIAGNOSIS — E1169 Type 2 diabetes mellitus with other specified complication: Secondary | ICD-10-CM | POA: Diagnosis not present

## 2019-08-11 DIAGNOSIS — E669 Obesity, unspecified: Secondary | ICD-10-CM | POA: Diagnosis not present

## 2019-08-11 DIAGNOSIS — I10 Essential (primary) hypertension: Secondary | ICD-10-CM | POA: Diagnosis not present

## 2019-08-11 NOTE — Progress Notes (Signed)
Bariatric Supervised Weight Loss Visit Appt Start Time: 2:45pm   End Time: 3:00pm  Planned Surgery: Sleeve   3rd out of 6 SWL Appointments     NUTRITION ASSESSMENT  Anthropometrics  Start weight at [facility]: 226 lbs  Today's weight: 229 lbs Weight change: +0.5 lbs (since previous visit on 07/01/2019) BMI: 43.3 kg/m2    Clinical  Medical Hx: diabetes, obesity Medications: see list   Lifestyle & Dietary Hx Pt's husband is supportive. States she is used to taking care of everyone else and does not know how to focus on herself. Pt admits to food being her comfort and has some anxiety and feels overwhelmed about making lifestyle changes especially among stress.   Evening schedule is typically eat dinner, have a snack around 8 or 9pm (such as apple with peanut butter or popcorn), go to bed around 2 or 3am, then wake up and check blood sugar around 9am.   Checks her blood sugar daily, fasting and in the evening (sometimes 2 hours after dinner as well.) Fasting is usually between 60 and 120. Nighttime readings can be up in the 200s. States she records what she has eaten when her nighttime reading is high.    Has pizza for dinner 1x/week. Will have smoked chicken a couple nights per week.   24-Hr Dietary Recall First Meal: bacon + egg + roll (or fast food BEC english muffin)  Snack: - Second Meal: sandwich on low-calorie bread + chips  Snack: - Third Meal: veggie + roll  Snack: -  Estimated Energy Needs Calories: 1600 Carbohydrate: 180g Protein: 100g Fat: 53g   NUTRITION DIAGNOSIS  Overweight/obesity ([place]-3.3) related to past poor dietary habits and physical inactivity as evidenced by patient w/ planned Sleeve Gastrectomy surgery following dietary guidelines for continued weight loss.   NUTRITION INTERVENTION  Nutrition counseling (C-1) and education (E-2) to facilitate bariatric surgery goals.  Pre-Op Goals Progress & New Goals . Logging food and beverage intake  . Checking  blood sugar (fasting and at nighttime, not checking 2 hours after meals)  . Choosing healthier snacks  . Working on increasing fluid intake  . NEW: Find protein drink flavors you enjoy   Handouts Provided Include   Bariatric Surgery Protein Drinks   Learning Style & Readiness for Change Teaching method utilized: Visual & Auditory  Demonstrated degree of understanding via: Teach Back  Barriers to learning/adherence to lifestyle change: Contemplative Stage of Change  RD's Notes for next Visit   Discuss post op liquid diet (per pt request)   Balanced meals   MONITORING & EVALUATION Dietary intake, weekly physical activity, body weight, and pre-op goals in 1 month.   Next Steps  Patient is to return to [facility] in 1 month for 4th SWL visit.

## 2019-08-14 ENCOUNTER — Other Ambulatory Visit: Payer: Self-pay | Admitting: Family Medicine

## 2019-08-18 ENCOUNTER — Other Ambulatory Visit: Payer: Self-pay

## 2019-08-18 ENCOUNTER — Ambulatory Visit: Payer: Medicare Other | Admitting: Family Medicine

## 2019-08-18 ENCOUNTER — Encounter: Payer: Self-pay | Admitting: Family Medicine

## 2019-08-18 VITALS — BP 134/72 | HR 70 | Temp 98.3°F | Resp 16 | Ht 61.0 in | Wt 225.8 lb

## 2019-08-18 DIAGNOSIS — K7581 Nonalcoholic steatohepatitis (NASH): Secondary | ICD-10-CM

## 2019-08-18 DIAGNOSIS — I10 Essential (primary) hypertension: Secondary | ICD-10-CM

## 2019-08-18 DIAGNOSIS — Z23 Encounter for immunization: Secondary | ICD-10-CM

## 2019-08-18 DIAGNOSIS — E1149 Type 2 diabetes mellitus with other diabetic neurological complication: Secondary | ICD-10-CM

## 2019-08-18 DIAGNOSIS — D500 Iron deficiency anemia secondary to blood loss (chronic): Secondary | ICD-10-CM | POA: Diagnosis not present

## 2019-08-18 LAB — COMPREHENSIVE METABOLIC PANEL
ALT: 54 U/L — ABNORMAL HIGH (ref 0–35)
AST: 71 U/L — ABNORMAL HIGH (ref 0–37)
Albumin: 3.6 g/dL (ref 3.5–5.2)
Alkaline Phosphatase: 89 U/L (ref 39–117)
BUN: 8 mg/dL (ref 6–23)
CO2: 27 mEq/L (ref 19–32)
Calcium: 8.6 mg/dL (ref 8.4–10.5)
Chloride: 103 mEq/L (ref 96–112)
Creatinine, Ser: 0.66 mg/dL (ref 0.40–1.20)
GFR: 88.04 mL/min (ref >60.00)
Glucose, Bld: 99 mg/dL (ref 70–99)
Potassium: 3.8 mEq/L (ref 3.5–5.1)
Sodium: 141 mEq/L (ref 135–145)
Total Bilirubin: 0.7 mg/dL (ref 0.2–1.2)
Total Protein: 7.4 g/dL (ref 6.0–8.3)

## 2019-08-18 LAB — CBC
HCT: 36.7 % (ref 36.0–46.0)
Hemoglobin: 12.2 g/dL (ref 12.0–15.0)
MCHC: 33.2 g/dL (ref 30.0–36.0)
MCV: 91.1 fl (ref 78.0–100.0)
Platelets: 134 10*3/uL — ABNORMAL LOW (ref 150.0–400.0)
RBC: 4.03 Mil/uL (ref 3.87–5.11)
RDW: 16 % — ABNORMAL HIGH (ref 11.5–15.5)
WBC: 5.3 10*3/uL (ref 4.0–10.5)

## 2019-08-18 LAB — HEMOGLOBIN A1C: Hgb A1c MFr Bld: 7.5 % — ABNORMAL HIGH (ref 4.6–6.5)

## 2019-08-18 LAB — TSH: TSH: 2.54 u[IU]/mL (ref 0.35–4.50)

## 2019-08-18 MED ORDER — GABAPENTIN 600 MG PO TABS: ORAL_TABLET | ORAL | 3 refills | Status: DC

## 2019-08-18 NOTE — Progress Notes (Signed)
OFFICE VISIT  08/18/2019   CC:  Chief Complaint  Patient presents with  . Follow-up    RCI, pt is fasting     HPI:    Patient is a 73 y.o. Caucasian female who presents for 4 mo f/u morbid obesity, DM, HTN, NASH, and iron deficiency anemia.   Iron deficiency anemia secondary to chronic blood loss. No lesions discovered on recent colonoscopy 06/2019.  2016 EGD unremarkable.  Givens capsule study done 08/03/19 (Dr. Laural [name]). No record of result in EMR.  Pt states she was contacted by Dr. Laural [name] and further testing/scope is to be done but she can't recall any specifics. No melena.  Taking MVI qd.  DM: U500 ->125, 135, 125. Trulicity 4.1YS q week. Glucoses: fasting range 80-150, 2H PP prelunch 200 or so , 2H PP presupp->pt doesn't recall reliably. Sounds like overall avg around 200.   Morbid obesity + NASH/nonalc cirrhosis->transaminases have been stable.  Pt plans on gastric sleeve surgery, in the process of getting qualified for this, seeing nutritionist monthly x 2, has 4 more monthly visits and then gets psych eval prior to being able to schedule surgery.  HTN: no home bp monitoring.    She has trouble swallowing her gabapentin capsules--they gag her--so she requests change to gabapentin tabs.  Her current dose of 900 mg bid has helped her feet neuropathy pain well but hands still with some pain. She does get drowsy after taking this med.  ROS: no fevers, no CP, no SOB, no wheezing, no cough, no dizziness, no HAs, no rashes, no melena/hematochezia.  No polyuria or polydipsia.  No myalgias or arthralgias.   Past Medical History:  Diagnosis Date  . Anxiety   . 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 ? to get liver bx if she gets bariatric surgery?  . Dietary iron deficiency without anemia 12/2018   Possibly inadequate absorption secondary to chronic/long term PPI  therapy.  flintstone vitamin bid started end of april 2020.  Colonoscopy ok 2020.  Capsule endoscopy ->set for 08/03/19.  . Fibromyalgia   . GERD   . History of thrombocytopenia 12/2011  . Hyperlipidemia    Intolerant of statins  . HYPERTENSION   . IBS (irritable bowel syndrome)    -D.  Good response to bentyl and imodium as of 06/2018 GI f/u.  Marland Kitchen IDDM (insulin dependent diabetes mellitus)    with DPN (managed by Dr. Cruzita Lederer but then in 2018 pt preferred to have me manage for her convenience)  . Limited mobility    Requires a walker for arthritic pain, widespread musculoskeletal pain, and neuropathic pain.  . Morbid obesity ([place])    As of 11/2018, pt considering sleeve gastectomy vs bipass as of eval by Dr. Lucia Gaskins: next steps are labs/xrays, psych and nutrit consults, and cardiology clearance.  . Nonalcoholic steatohepatitis    Viral Hep screens NEG.  CT 2015.  Transaminasemia.  U/S 07/2018 showed early changes of cirrhosis.  . OSA (obstructive sleep apnea) 09/14/2015   sleep study 09/07/15: severe obstructive sleep apnea with an AHI of 72 and SaO2 low of 75%.>refer to sleep med for eval and tx options  . Osteoarthritis    hips, shoulders, knees  . PAF (paroxysmal atrial fibrillation) ([place])    One documented episode (after getting EGD 2016).  Was on amiodarone x 3 mo.  Rate control with metoprolol + anticoag with xarelto.   Marland Kitchen  Small fiber neuropathy    Due to DM.  Symmetric hands and feet tingling/numbness.  Marland Kitchen Ulcerative colitis ([place])    Remicade infusion Q 8 weeks: in clinical and endoscopic remission as of 12/2018 GI f/u.  06/11/19 rpt colonoscopy->cecal and ascending colon colitis.    Past Surgical History:  Procedure Laterality Date  . ABDOMINAL HYSTERECTOMY  1980   Paps no longer indicated.  Marland Kitchen BACTERIAL OVERGROWTH TEST N/A 07/13/2015   Procedure: BACTERIAL OVERGROWTH TEST;  Surgeon: Rogene Houston, MD;  [facility];  Service: Endoscopy;  Laterality: N/A;  730    .  BILATERAL SALPINGOOPHORECTOMY  02/10/2001  . BIOPSY  06/11/2019   Procedure: BIOPSY;  Surgeon: Rogene Houston, MD;  [facility];  Service: Endoscopy;;  colon  . BREAST REDUCTION SURGERY  1994   bilat  . CARDIOVASCULAR STRESS TEST  07/2010   Lexiscan myoview: normal  . CARPAL TUNNEL RELEASE Right 1996  . CARPAL TUNNEL RELEASE Left 03/21/2003  . CARPAL TUNNEL RELEASE Right 05/04/2013   Procedure: CARPAL TUNNEL RELEASE;  Surgeon: Cammie Sickle., MD;  Location: [place];  Service: Orthopedics;  Laterality: Right;  . CARPAL TUNNEL RELEASE Left 09/21/2013   Procedure: LEFT CARPAL TUNNEL RELEASE;  Surgeon: Cammie Sickle., MD;  Location: [place];  Service: Orthopedics;  Laterality: Left;  . CHOLECYSTECTOMY    . COLONOSCOPY WITH PROPOFOL N/A 08/04/2015   Colitis in remission.  No polyps.  Procedure: COLONOSCOPY WITH PROPOFOL;  Surgeon: Rogene Houston, MD;  [facility]: [facility];  Service: Endoscopy;  Laterality: N/A;  cecum time in  0820   time out  0827    total time 7 minutes  . COLONOSCOPY WITH PROPOFOL N/A 06/11/2019   cecal and ascending colon colitis.  Procedure: COLONOSCOPY WITH PROPOFOL;  Surgeon: Rogene Houston, MD;  [facility];  Service: Endoscopy;  Laterality: N/A;  730a  . ESOPHAGEAL DILATION N/A 08/04/2015   Procedure: ESOPHAGEAL DILATION;  Surgeon: Rogene Houston, MD;  Location: [facility];  Service: Endoscopy;  Laterality: N/A;  [place] 57, no mucousal disruption  . ESOPHAGOGASTRODUODENOSCOPY (EGD) WITH ESOPHAGEAL DILATION  12/02/2005  . ESOPHAGOGASTRODUODENOSCOPY (EGD) WITH PROPOFOL N/A 08/04/2015   Procedure: ESOPHAGOGASTRODUODENOSCOPY (EGD) WITH PROPOFOL;  Surgeon: Rogene Houston, MD;  [facility];  Service: Endoscopy;  Laterality: N/A;  procedure 1  . FLEXIBLE SIGMOIDOSCOPY  01/17/2012   Procedure: FLEXIBLE SIGMOIDOSCOPY;  Surgeon: Rogene Houston, MD;  [facility];  Service: Endoscopy;  Laterality: N/A;  .  GIVENS CAPSULE STUDY N/A 08/03/2019   Procedure: GIVENS CAPSULE STUDY;  Surgeon: Rogene Houston, MD;  [facility];  Service: Endoscopy;  Laterality: N/A;  730AM  . HEMILAMINOTOMY LUMBAR SPINE Bilateral 09/07/1999   L4-5  . KNEE ARTHROSCOPY Right 01/1999; 10/2000  . LYSIS OF ADHESION  02/10/2001  . [place]; 09/12/2006  . TARSAL TUNNEL RELEASE  2002  . TRANSTHORACIC ECHOCARDIOGRAM  08/04/2015   EF 60-65%, normal wall motion, mild LVH, mild LA dilation, grd I DD.  Marland Kitchen TUMOR EXCISION Left 03/21/2003   dorsal 1st web space (hand)  . URETEROLYSIS Right 02/10/2001    Outpatient Medications Prior to Visit  Medication Sig Dispense Refill  . acetaminophen (TYLENOL) 500 MG tablet Take 1,000 mg by mouth every 6 (six) hours as needed for moderate pain or headache.    . citalopram (CELEXA) 20 MG tablet TAKE 1 TABLET BY MOUTH EVERY DAY 90  tablet 0  . Cyanocobalamin (B-12) 5000 MCG CAPS Take 5,000 mcg by mouth daily.    Marland Kitchen dicyclomine (BENTYL) 10 MG capsule TAKE 1 CAPSULE (10 MG TOTAL) BY MOUTH 3 (THREE) TIMES DAILY BEFORE MEALS. 270 capsule 3  . esomeprazole (NEXIUM) 20 MG capsule Take 20 mg by mouth daily before breakfast.     . furosemide (LASIX) 40 MG tablet TAKE 1 TABLET BY MOUTH EVERY DAY (Patient taking differently: Take 40 mg by mouth daily. ) 90 tablet 1  . hydrocortisone (PROCTOSOL HC) 2.5 % rectal cream Place 1 application rectally 2 (two) times daily. (Patient taking differently: Place 1 application rectally 2 (two) times daily as needed for anal itching. ) 30 g 2  . inFLIXimab (REMICADE) 100 MG injection Inject into the vein every 8 (eight) weeks.     . Insulin Pen Needle (B-D UF III MINI PEN NEEDLES) 31G X 5 MM MISC USE TO INJECT INSULINS EQUAL TO 6 TIMES DAILY. 600 each 5  . insulin regular human CONCENTRATED (HUMULIN R U-500 KWIKPEN) 500 UNIT/ML kwikpen Inject 123U subcutaneously at breakfast, 133U mid-day. 117U at supper. If sugar is greater than 200 at bedtime inject an  additional 25U. (Patient taking differently: Inject 25-135 Units into the skin See admin instructions. Inject 125 units subcutaneously at breakfast, 135 units at lunch, and 125 units at supper. If sugar is greater than 200 at bedtime inject an additional 25U.) 60 mL 0  . KLOR-CON M10 10 MEQ tablet TAKE 2 TABLETS (20 MEQ TOTAL) BY MOUTH DAILY. -- OFFICE VISIT NEEDED FOR FURTHER REFILLS (Patient taking differently: Take 10 mEq by mouth 2 (two) times daily. ) 180 tablet 0  . lactase (LACTAID) 3000 UNITS tablet Take 3,000 Units by mouth as needed (when eating foods containing dairy).     . Lancets (ONETOUCH DELICA PLUS IAXKPV37S) MISC USE AS DIRECTED TO CHECK BLOOD SUGAR UP TO 6 TIMES PER DAY. DX: E11.09 500 each 3  . loperamide (IMODIUM) 2 MG capsule Take 2-4 mg by mouth as needed for diarrhea or loose stools.  30 capsule   . meclizine (ANTIVERT) 25 MG tablet Take 1 tablet (25 mg total) by mouth 3 (three) times daily as needed for dizziness or nausea. 90 tablet 0  . metoprolol succinate (TOPROL-XL) 100 MG 24 hr tablet TAKE 1 TABLET BY MOUTH 2 TIMES DAILY. TAKE WITH OR IMMEDIATELY FOLLOWING A MEAL 180 tablet 1  . ONETOUCH ULTRA test strip USE AS INSTRUCTED TO CHECK SUGAR 6 TIMES DAILY 400 strip 5  . Pediatric Multivitamins-Iron (FLINTSTONES PLUS IRON) chewable tablet Chew 1 tablet by mouth 2 (two) times daily. (Patient taking differently: Chew 2 tablets by mouth at bedtime. )    . rivaroxaban (XARELTO) 20 MG TABS tablet Take 1 tablet (20 mg total) by mouth. 90 tablet 1  . TRULICITY 1.5 MO/7.0BE SOPN INJECT UNDER SKIN 1.5 MG WEEKLY UNDER SKIN. (Patient taking differently: Inject 1.5 mg as directed every Thursday. ) 12 pen 3  . gabapentin (NEURONTIN) 300 MG capsule 1 cap po bid (to be taken with a 600 mg gabapentin tab at each dose) (Patient taking differently: Take 300 mg by mouth 2 (two) times daily. Take with 600 mg to equal 900 mg twice daily) 180 capsule 1  . gabapentin (NEURONTIN) 600 MG tablet Take 1  tablet (600 mg total) by mouth 2 (two) times daily. (each dose is to be taken with a 300 mg gabapentin pill (Patient taking differently: Take 600 mg by mouth 2 (two) times  daily. Take with 300 mg to equal 900 mg twice daily) 180 tablet 1   No facility-administered medications prior to visit.     Allergies  Allergen Reactions  . Omeprazole Anaphylaxis and Swelling    SWELLING OF TONGUE AND THROAT  . Actos [Pioglitazone] Other (See Comments)    Weight gain  . Benzocaine-Menthol Swelling    SWELLING OF MOUTH  . Colesevelam Other (See Comments)    GI UPSET  . Flagyl [Metronidazole Hcl] Other (See Comments)    DIAPHORESIS  . Metformin And Related Diarrhea  . Shrimp [Shellfish Allergy] Itching    OF THROAT AND EARS  . Statins Other (See Comments)    HEART RACING  . Desipramine Hcl Itching, Nausea Only and Other (See Comments)    "swimmy" headed, ears itched   . Hydromorphone Itching  . Jardiance [Empagliflozin] Other (See Comments)    weakness  . Lactose Intolerance (Gi) Diarrhea    Gas, bloating  . Adhesive [Tape] Other (See Comments)    SKIN IRRITATION AND BRUISING  . Nisoldipine Itching  . Percocet [Oxycodone-Acetaminophen] Itching    ROS As per HPI  PE: Blood pressure 134/72, pulse 70, temperature 98.3 F (36.8 C), temperature source Temporal, resp. rate 16, height 5' 1"  (1.549 m), weight 225 lb 12.8 oz (102.4 kg), SpO2 96 %. Body mass index is 42.66 kg/m.  Gen: Alert, well appearing.  Patient is oriented to person, place, time, and situation. AFFECT: pleasant, lucid thought and speech. CV: RRR, no m/r/g.   LUNGS: CTA bilat, nonlabored resps, good aeration in all lung fields. EXT: no clubbing or cyanosis.  1+ pitting edema.    LABS:  Lab Results  Component Value Date   TSH 1.01 12/18/2016   Lab Results  Component Value Date   WBC 4.0 04/27/2019   HGB 11.2 (L) 06/11/2019   HCT 36.5 06/11/2019   MCV 79.9 04/27/2019   PLT 139.0 (L) 04/27/2019   Lab Results   Component Value Date   IRON 30 (L) 04/27/2019   TIBC 372 04/27/2019   FERRITIN 8 (L) 04/27/2019    Lab Results  Component Value Date   CREATININE 0.72 04/27/2019   BUN 12 04/27/2019   NA 139 04/27/2019   K 3.9 04/27/2019   CL 103 04/27/2019   CO2 28 04/27/2019   Lab Results  Component Value Date   ALT 38 (H) 04/27/2019   AST 46 (H) 04/27/2019   ALKPHOS 79 04/27/2019   BILITOT 0.7 04/27/2019   Lab Results  Component Value Date   CHOL 224 (H) 12/01/2017   Lab Results  Component Value Date   HDL 38.60 (L) 12/01/2017   Lab Results  Component Value Date   LDLCALC 156 (H) 12/01/2017   Lab Results  Component Value Date   TRIG 149.0 12/01/2017   Lab Results  Component Value Date   CHOLHDL 6 12/01/2017   Lab Results  Component Value Date   HGBA1C 7.6 (H) 04/27/2019    IMPRESSION AND PLAN:  1) DM: home glucoses a bit difficult to get a bead on today, but sounds like pretty comparable to last 1 yr's control.   Hba1c, lytes/cr today. TSH today.  2) HTN:The current medical regimen is effective;  continue present plan and medications. Lytes/cr today.  3) Morbid obesity: cont nutritionist, then psych eval, then to get scheduled for gastric sleeve surgery.  4) NASH/nonalc cirrhosis: monitor hepatic panel, cbc. Possibly to get liver bx at the time of her bariatric surgery in near  future. Dr. Laural [name] following.  5) Iron def anemia from chronic blood loss: Dr. Laural [name] has done endoscopic w/u, including Givens capsule study->results ? Not in EMR.  Pt could not clearly tell me what next step is but she will contact GI office to see. She is taking iron in her MVI. Rechecking CBC and iron panel today.  6) DPN: pretty good response to gabapentin at 900 mg bid, too much sedation to push the dose further. Changed to 600 mg tabs for easier administration: 600 mg, 1 and 1/2 tabs bid.  An After Visit Summary was printed and given to the patient.  FOLLOW UP: Return in about 4  months (around 12/16/2019) for annual CPE (fasting).  Signed:  Crissie Sickles, MD           08/18/2019

## 2019-08-19 ENCOUNTER — Telehealth (INDEPENDENT_AMBULATORY_CARE_PROVIDER_SITE_OTHER): Payer: Self-pay | Admitting: *Deleted

## 2019-08-19 LAB — IRON,TIBC AND FERRITIN PANEL
%SAT: 21 % (calc) (ref 16–45)
Ferritin: 13 ng/mL — ABNORMAL LOW (ref 16–288)
Iron: 72 ug/dL (ref 45–160)
TIBC: 349 mcg/dL (calc) (ref 250–450)

## 2019-08-19 NOTE — Telephone Encounter (Signed)
Patient left message stating I was to schedule her for something - please advise

## 2019-08-20 NOTE — Telephone Encounter (Signed)
Patient had small amount of fresh blood in what appeared to be proximal stomach on given capsule study. Therefore she will need an EGD.  She will need propofol.

## 2019-08-23 ENCOUNTER — Other Ambulatory Visit (INDEPENDENT_AMBULATORY_CARE_PROVIDER_SITE_OTHER): Payer: Self-pay | Admitting: *Deleted

## 2019-08-23 DIAGNOSIS — K5190 Ulcerative colitis, unspecified, without complications: Secondary | ICD-10-CM

## 2019-08-23 DIAGNOSIS — D509 Iron deficiency anemia, unspecified: Secondary | ICD-10-CM

## 2019-08-23 DIAGNOSIS — K922 Gastrointestinal hemorrhage, unspecified: Secondary | ICD-10-CM

## 2019-08-23 NOTE — Progress Notes (Signed)
HPI: FU atrial fibrillation. Stress echocardiogram performed in September of 2011 was interpreted as normal. There was mention that LV function may not have improved as much as expected and if clinical suspicion high consider alternative imaging. Lexiscan Myoview in November of 2011 showed normal perfusion. Study not gated. CardioNet showed sinus with PVCs. Had EGD in November 2016 and developed atrial fibrillation. Placed on amiodarone and xarelto. Echocardiogram November 2016 showed normal LV function, moderate left ventricular hypertrophy, grade 1 diastolic dysfunction and mild left atrial enlargement. TSH November 2016 normal. Amiodarone DCed at previousov. Since she was last seen,there is no new dyspnea, chest pain, palpitations or syncope.  She occasionally has nosebleeds.  Current Outpatient Medications  Medication Sig Dispense Refill  . acetaminophen (TYLENOL) 500 MG tablet Take 1,000 mg by mouth every 6 (six) hours as needed for moderate pain or headache.    . citalopram (CELEXA) 20 MG tablet TAKE 1 TABLET BY MOUTH EVERY DAY 90 tablet 0  . Cyanocobalamin (B-12) 5000 MCG CAPS Take 5,000 mcg by mouth daily.    Marland Kitchen dicyclomine (BENTYL) 10 MG capsule TAKE 1 CAPSULE (10 MG TOTAL) BY MOUTH 3 (THREE) TIMES DAILY BEFORE MEALS. 270 capsule 3  . esomeprazole (NEXIUM) 20 MG capsule Take 20 mg by mouth daily before breakfast.     . furosemide (LASIX) 40 MG tablet TAKE 1 TABLET BY MOUTH EVERY DAY (Patient taking differently: Take 40 mg by mouth daily. ) 90 tablet 1  . gabapentin (NEURONTIN) 600 MG tablet 1.5 tabs po bid 270 tablet 3  . hydrocortisone (PROCTOSOL HC) 2.5 % rectal cream Place 1 application rectally 2 (two) times daily. (Patient taking differently: Place 1 application rectally 2 (two) times daily as needed for anal itching. ) 30 g 2  . inFLIXimab (REMICADE) 100 MG injection Inject into the vein every 8 (eight) weeks.     . Insulin Pen Needle (B-D UF III MINI PEN NEEDLES) 31G X 5 MM  MISC USE TO INJECT INSULINS EQUAL TO 6 TIMES DAILY. 600 each 5  . insulin regular human CONCENTRATED (HUMULIN R U-500 KWIKPEN) 500 UNIT/ML kwikpen Inject 123U subcutaneously at breakfast, 133U mid-day. 117U at supper. If sugar is greater than 200 at bedtime inject an additional 25U. (Patient taking differently: Inject 25-135 Units into the skin See admin instructions. Inject 125 units subcutaneously at breakfast, 135 units at lunch, and 125 units at supper. If sugar is greater than 200 at bedtime inject an additional 25U.) 60 mL 0  . KLOR-CON M10 10 MEQ tablet TAKE 2 TABLETS (20 MEQ TOTAL) BY MOUTH DAILY. -- OFFICE VISIT NEEDED FOR FURTHER REFILLS 180 tablet 0  . lactase (LACTAID) 3000 UNITS tablet Take 3,000 Units by mouth as needed (when eating foods containing dairy).     . Lancets (ONETOUCH DELICA PLUS NIOEVO35K) MISC USE AS DIRECTED TO CHECK BLOOD SUGAR UP TO 6 TIMES PER DAY. DX: E11.09 500 each 3  . loperamide (IMODIUM) 2 MG capsule Take 2-4 mg by mouth as needed for diarrhea or loose stools.  30 capsule   . meclizine (ANTIVERT) 25 MG tablet Take 1 tablet (25 mg total) by mouth 3 (three) times daily as needed for dizziness or nausea. 90 tablet 0  . metoprolol succinate (TOPROL-XL) 100 MG 24 hr tablet TAKE 1 TABLET BY MOUTH 2 TIMES DAILY. TAKE WITH OR IMMEDIATELY FOLLOWING A MEAL 180 tablet 1  . ONETOUCH ULTRA test strip USE AS INSTRUCTED TO CHECK SUGAR 6 TIMES DAILY 400 strip 5  .  Pediatric Multivitamins-Iron (FLINTSTONES PLUS IRON) chewable tablet Chew 1 tablet by mouth 2 (two) times daily. (Patient taking differently: Chew 2 tablets by mouth at bedtime. )    . rivaroxaban (XARELTO) 20 MG TABS tablet Take 1 tablet (20 mg total) by mouth. 90 tablet 1  . TRULICITY 1.5 ZO/1.0RU SOPN INJECT UNDER SKIN 1.5 MG WEEKLY UNDER SKIN. (Patient taking differently: Inject 1.5 mg as directed every Thursday. ) 12 pen 3   No current facility-administered medications for this visit.      Past Medical History:   Diagnosis Date  . Anxiety   . 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 ? to get liver bx if she gets bariatric surgery?  . Dietary iron deficiency without anemia 12/2018   Possibly inadequate absorption secondary to chronic/long term PPI therapy.  flintstone vitamin bid started end of april 2020.  Colonoscopy ok 2020.  Capsule endoscopy ->set for 08/03/19.  . Fibromyalgia   . GERD   . History of thrombocytopenia 12/2011  . Hyperlipidemia    Intolerant of statins  . HYPERTENSION   . IBS (irritable bowel syndrome)    -D.  Good response to bentyl and imodium as of 06/2018 GI f/u.  Marland Kitchen IDDM (insulin dependent diabetes mellitus)    with DPN (managed by Dr. Cruzita [name] but then in 2018 pt preferred to have me manage for her convenience)  . Limited mobility    Requires a walker for arthritic pain, widespread musculoskeletal pain, and neuropathic pain.  . Morbid obesity ([place])    As of 11/2018, pt considering sleeve gastectomy vs bipass as of eval by Dr. Lucia [name]: next steps are labs/xrays, psych and nutrit consults, and cardiology clearance.  . Nonalcoholic steatohepatitis    Viral Hep screens NEG.  CT 2015.  Transaminasemia.  U/S 07/2018 showed early changes of cirrhosis.  . OSA (obstructive sleep apnea) 09/14/2015   sleep study 09/07/15: severe obstructive sleep apnea with an AHI of 72 and SaO2 low of 75%.>refer to sleep med for eval and tx options  . Osteoarthritis    hips, shoulders, knees  . PAF (paroxysmal atrial fibrillation) ([place])    One documented episode (after getting EGD 2016).  Was on amiodarone x 3 mo.  Rate control with metoprolol + anticoag with xarelto.   . Small fiber neuropathy    Due to DM.  Symmetric hands and feet tingling/numbness.  Marland Kitchen Ulcerative colitis ([place])    Remicade infusion Q 8 weeks: in clinical and endoscopic remission as of 12/2018 GI f/u.  06/11/19 rpt  colonoscopy->cecal and ascending colon colitis.    Past Surgical History:  Procedure Laterality Date  . ABDOMINAL HYSTERECTOMY  1980   Paps no longer indicated.  Marland Kitchen BACTERIAL OVERGROWTH TEST N/A 07/13/2015   Procedure: BACTERIAL OVERGROWTH TEST;  Surgeon: Rogene Houston, MD;  [facility];  Service: Endoscopy;  Laterality: N/A;  730    . BILATERAL SALPINGOOPHORECTOMY  02/10/2001  . BIOPSY  06/11/2019   Procedure: BIOPSY;  Surgeon: Rogene Houston, MD;  [facility];  Service: Endoscopy;;  colon  . BREAST REDUCTION SURGERY  1994   bilat  . CARDIOVASCULAR STRESS TEST  07/2010   Lexiscan myoview: normal  . CARPAL TUNNEL RELEASE Right 1996  . CARPAL TUNNEL RELEASE Left 03/21/2003  . CARPAL TUNNEL RELEASE Right 05/04/2013   Procedure: CARPAL TUNNEL RELEASE;  Surgeon: Cammie Sickle., MD;  Location: [facility];  Service: Orthopedics;  Laterality: Right;  . CARPAL TUNNEL RELEASE Left 09/21/2013   Procedure: LEFT CARPAL TUNNEL RELEASE;  Surgeon: Cammie Sickle., MD;  Location: [facility];  Service: Orthopedics;  Laterality: Left;  . CHOLECYSTECTOMY    . COLONOSCOPY WITH PROPOFOL N/A 08/04/2015   Colitis in remission.  No polyps.  Procedure: COLONOSCOPY WITH PROPOFOL;  Surgeon: Rogene Houston, MD;  Location: [facility];  Service: Endoscopy;  Laterality: N/A;  cecum time in  0820   time out  0827    total time 7 minutes  . COLONOSCOPY WITH PROPOFOL N/A 06/11/2019   cecal and ascending colon colitis.  Procedure: COLONOSCOPY WITH PROPOFOL;  Surgeon: Rogene Houston, MD;  [facility];  Service: Endoscopy;  Laterality: N/A;  730a  . ESOPHAGEAL DILATION N/A 08/04/2015   Procedure: ESOPHAGEAL DILATION;  Surgeon: Rogene Houston, MD;  [facility]: [facility];  Service: Endoscopy;  Laterality: N/A;  [place] 57, no mucousal disruption  . ESOPHAGOGASTRODUODENOSCOPY (EGD) WITH ESOPHAGEAL DILATION  12/02/2005  . ESOPHAGOGASTRODUODENOSCOPY (EGD) WITH  PROPOFOL N/A 08/04/2015   Procedure: ESOPHAGOGASTRODUODENOSCOPY (EGD) WITH PROPOFOL;  Surgeon: Rogene Houston, MD;  [facility];  Service: Endoscopy;  Laterality: N/A;  procedure 1  . FLEXIBLE SIGMOIDOSCOPY  01/17/2012   Procedure: FLEXIBLE SIGMOIDOSCOPY;  Surgeon: Rogene Houston, MD;  [facility];  Service: Endoscopy;  Laterality: N/A;  . GIVENS CAPSULE STUDY N/A 08/03/2019   Procedure: GIVENS CAPSULE STUDY;  Surgeon: Rogene Houston, MD;  [facility];  Service: Endoscopy;  Laterality: N/A;  730AM  . HEMILAMINOTOMY LUMBAR SPINE Bilateral 09/07/1999   L4-5  . KNEE ARTHROSCOPY Right 01/1999; 10/2000  . LYSIS OF ADHESION  02/10/2001  . [place]; 09/12/2006  . TARSAL TUNNEL RELEASE  2002  . TRANSTHORACIC ECHOCARDIOGRAM  08/04/2015   EF 60-65%, normal wall motion, mild LVH, mild LA dilation, grd I DD.  Marland Kitchen TUMOR EXCISION Left 03/21/2003   dorsal 1st web space (hand)  . URETEROLYSIS Right 02/10/2001    Social History   Socioeconomic History  . Marital status: Married    Spouse name: Not on file  . Number of children: Not on file  . Years of education: Not on file  . Highest education level: Not on file  Occupational History  . Occupation: Retired  Scientific laboratory technician  . Financial resource strain: Not on file  . Food insecurity    Worry: Not on file    Inability: Not on file  . Transportation needs    Medical: Not on file    Non-medical: Not on file  Tobacco Use  . Smoking status: Never Smoker  . Smokeless tobacco: Never Used  Substance and Sexual Activity  . Alcohol use: Yes    Alcohol/week: 0.0 - 1.0 standard drinks  . Drug use: No  . Sexual activity: Not Currently    Partners: Male    Birth control/protection: Surgical, Abstinence    Comment: hysterectomy  Lifestyle  . Physical activity    Days per week: Not on file    Minutes per session: Not on file  . Stress: Not on file  Relationships  . Social Herbalist on phone: Not on  file    Gets together: Not on file    Attends religious service: Not on file    Active member of club or organization: Not on file    Attends meetings of clubs  or organizations: Not on file    Relationship status: Not on file  . Intimate partner violence    Fear of current or ex partner: Not on file    Emotionally abused: Not on file    Physically abused: Not on file    Forced sexual activity: Not on file  Other Topics Concern  . Not on file  Social History Narrative   She lives with husband in two-story home.  They have one grown son and 2 grandchildren.   She is retired [employer].   Highest of level education:  Some college.   Never smoker.   Alcohol: rare.    Family History  Problem Relation Age of Onset  . Diabetes Mother   . Hypertension Mother   . Heart attack Father        Mid 18's  . Heart disease Father   . Lung disease Father        spot on lung; had lung surgery  . Alcohol abuse Other   . Hypertension Son   . Diabetes Son   . Emphysema Maternal Grandfather   . Asthma Maternal Grandfather   . Colon cancer Paternal Grandfather   . Stomach cancer Paternal Grandmother   . Neuropathy Neg Hx     ROS: no fevers or chills, productive cough, hemoptysis, dysphasia, odynophagia, melena, hematochezia, dysuria, hematuria, rash, seizure activity, orthopnea, PND, pedal edema, claudication. Remaining systems are negative.  Physical Exam: Well-developed well-nourished in no acute distress.  Skin is warm and dry.  HEENT is normal.  Neck is supple.  Chest is clear to auscultation with normal expansion.  Cardiovascular exam is regular rate and rhythm.  Abdominal exam nontender or distended. No masses palpated. Extremities show trace edema. neuro grossly intact  ECG-sinus rhythm at a rate of 74, nonspecific ST changes.  Personally reviewed  A/P  1 paroxysmal atrial fibrillation-patient remains in sinus rhythm.  Continue metoprolol at present dose.  Continue  Xarelto.    2 hypertension-blood pressure controlled.  Continue present medications.  3 hyperlipidemia-managed by primary care.  Kirk Ruths, MD

## 2019-08-24 ENCOUNTER — Encounter (INDEPENDENT_AMBULATORY_CARE_PROVIDER_SITE_OTHER): Payer: Self-pay | Admitting: *Deleted

## 2019-08-24 DIAGNOSIS — K5190 Ulcerative colitis, unspecified, without complications: Secondary | ICD-10-CM | POA: Insufficient documentation

## 2019-08-24 DIAGNOSIS — K922 Gastrointestinal hemorrhage, unspecified: Secondary | ICD-10-CM | POA: Insufficient documentation

## 2019-08-25 NOTE — Telephone Encounter (Signed)
EGD sch'd 09/27/19, left detailed message for patient, mailed instructions

## 2019-08-31 ENCOUNTER — Other Ambulatory Visit: Payer: Self-pay | Admitting: Family Medicine

## 2019-08-31 ENCOUNTER — Encounter: Payer: Self-pay | Admitting: Cardiology

## 2019-08-31 ENCOUNTER — Other Ambulatory Visit: Payer: Self-pay

## 2019-08-31 ENCOUNTER — Ambulatory Visit: Payer: Medicare Other | Admitting: Cardiology

## 2019-08-31 VITALS — BP 143/71 | HR 82 | Temp 97.2°F | Ht 61.0 in | Wt 232.2 lb

## 2019-08-31 DIAGNOSIS — I480 Paroxysmal atrial fibrillation: Secondary | ICD-10-CM

## 2019-08-31 DIAGNOSIS — E7800 Pure hypercholesterolemia, unspecified: Secondary | ICD-10-CM | POA: Diagnosis not present

## 2019-08-31 DIAGNOSIS — I10 Essential (primary) hypertension: Secondary | ICD-10-CM

## 2019-08-31 NOTE — Patient Instructions (Signed)
Medication Instructions:  NO CHANGE *If you need a refill on your cardiac medications before your next appointment, please call your pharmacy*  Lab Work: If you have labs (blood work) drawn today and your tests are completely normal, you will receive your results only by: Marland Kitchen MyChart Message (if you have MyChart) OR . A paper copy in the mail If you have any lab test that is abnormal or we need to change your treatment, we will call you to review the results.  Follow-Up: At [facility], you and your health needs are our priority.  As part of our continuing mission to provide you with exceptional heart care, we have created designated Provider Care Teams.  These Care Teams include your primary Cardiologist (physician) and Advanced Practice Providers (APPs -  Physician Assistants and Nurse Practitioners) who all work together to provide you with the care you need, when you need it.  Your next appointment:   12 month(s)  The format for your next appointment:   Either In Person or Virtual  Provider:   You may see Kirk Ruths, MD or one of the following Advanced Practice Providers on your designated Care Team:    Kerin Ransom, PA-C  [place], [place]  Coletta Memos, [place]

## 2019-09-02 ENCOUNTER — Other Ambulatory Visit: Payer: Self-pay | Admitting: Family Medicine

## 2019-09-06 ENCOUNTER — Other Ambulatory Visit: Payer: Self-pay

## 2019-09-06 ENCOUNTER — Encounter: Payer: Self-pay | Admitting: Dietician

## 2019-09-06 ENCOUNTER — Encounter: Payer: Medicare Other | Attending: Surgery | Admitting: Dietician

## 2019-09-06 DIAGNOSIS — I10 Essential (primary) hypertension: Secondary | ICD-10-CM | POA: Diagnosis not present

## 2019-09-06 DIAGNOSIS — E669 Obesity, unspecified: Secondary | ICD-10-CM | POA: Diagnosis not present

## 2019-09-06 DIAGNOSIS — E1169 Type 2 diabetes mellitus with other specified complication: Secondary | ICD-10-CM | POA: Diagnosis not present

## 2019-09-06 NOTE — Patient Instructions (Signed)
Great job this past month! Continue to keep an eye on your blood sugars and drink lots of water/fluids.   Continue to watch your portion sizes, good job on this so far! Use the "Plan Your Portions" handout and "Meal Ideas" sheet for ideas of balanced meals.

## 2019-09-06 NOTE — Progress Notes (Signed)
Bariatric Supervised Weight Loss Visit Appt Start Time: 2:55pm   End Time: 3:25pm  Planned Surgery: Sleeve   4th out of 6 SWL Appointments     NUTRITION ASSESSMENT  Anthropometrics  Start weight at [facility]: 226 lbs  Today's weight: 230.5 lbs Weight change: +0.5 lbs (since previous visit on 08/11/2019) BMI: 43.6 kg/m2    Clinical  Medical Hx: diabetes, obesity Medications: see list   Lifestyle & Dietary Hx Pt's husband is supportive. States she is used to taking care of everyone else and does not know how to focus on herself. Pt admits to food being her comfort and has some anxiety and feels overwhelmed about making lifestyle changes especially among stress.   Checks her blood sugar daily, fasting and in the evening (sometimes 2 hours after dinner as well.) Fasting is usually between 60 and 120. Nighttime readings have improved, pt states she has not gone over 200 for the past few weeks. Believes it is because she has reduced her portion sizes.     States she and her husband eat out most nights. Now, they split a meal. For example, pt eats 2 chicken tenders, some fries, and side salad rather than all 5 chicken tenders and the full serving of fries. Drinks 3 bottles of water plus 1 diet soda plus juice with breakfast each day.   24-Hr Dietary Recall First Meal: bacon + egg + toast  Snack: - Second Meal: banana + peanut butter sandwich   Snack: - Third Meal: 2 chicken tenders + fries + salad   Snack: - Beverages: water, diet Coke, diet ginger ale, juice   Estimated Energy Needs Calories: 1600 Carbohydrate: 180g Protein: 100g Fat: 53g   NUTRITION DIAGNOSIS  Overweight/obesity ([place]-3.3) related to past poor dietary habits and physical inactivity as evidenced by patient w/ planned Sleeve Gastrectomy surgery following dietary guidelines for continued weight loss.   NUTRITION INTERVENTION  Nutrition counseling (C-1) and education (E-2) to facilitate bariatric surgery  goals.  Pre-Op Goals Progress & New Goals . Logging food and beverage intake  . Checking blood sugar (fasting and at nighttime, not checking 2 hours after meals)  . Choosing healthier snacks  . Working on increasing fluid intake  . Found protein drink flavors you enjoy  . NEW: Monitor portion sizes   Handouts Provided Include   MyPlate Plan Your Portions   Meal Ideas   Learning Style & Readiness for Change Teaching method utilized: Visual & Auditory  Demonstrated degree of understanding via: Teach Back  Barriers to learning/adherence to lifestyle change: Contemplative Stage of Change  RD's Notes for next Visit   Chewing  Not drinking with meals    MONITORING & EVALUATION Dietary intake, weekly physical activity, body weight, and pre-op goals in 1 month.   Next Steps  Patient is to return to [facility] in 1 month for 5th SWL visit.

## 2019-09-14 ENCOUNTER — Other Ambulatory Visit: Payer: Self-pay | Admitting: Cardiology

## 2019-09-14 NOTE — Telephone Encounter (Signed)
Please review for refill, thanks ! 

## 2019-09-14 NOTE — Telephone Encounter (Signed)
73 F, 105.3 kg, Scr 0.66 (06/2019), LOV [place] 08/2019

## 2019-09-22 ENCOUNTER — Encounter (HOSPITAL_COMMUNITY): Payer: Self-pay

## 2019-09-22 ENCOUNTER — Other Ambulatory Visit: Payer: Self-pay

## 2019-09-22 ENCOUNTER — Encounter (HOSPITAL_COMMUNITY)
Admission: RE | Admit: 2019-09-22 | Discharge: 2019-09-22 | Disposition: A | Discharge status: Home / Self Care | Payer: Medicare Other | Source: Ambulatory Visit | Attending: Internal Medicine | Admitting: Internal Medicine

## 2019-09-25 ENCOUNTER — Other Ambulatory Visit (HOSPITAL_COMMUNITY)
Admission: RE | Admit: 2019-09-25 | Discharge: 2019-09-25 | Disposition: A | Discharge status: Home / Self Care | Payer: Medicare Other | Source: Ambulatory Visit | Attending: Internal Medicine | Admitting: Internal Medicine

## 2019-09-25 DIAGNOSIS — Z01812 Encounter for preprocedural laboratory examination: Secondary | ICD-10-CM | POA: Insufficient documentation

## 2019-09-25 DIAGNOSIS — Z20828 Contact with and (suspected) exposure to other viral communicable diseases: Secondary | ICD-10-CM | POA: Insufficient documentation

## 2019-09-25 LAB — SARS CORONAVIRUS 2 (TAT 6-24 HRS): SARS Coronavirus 2: NEGATIVE

## 2019-09-27 ENCOUNTER — Ambulatory Visit (HOSPITAL_COMMUNITY): Payer: Medicare Other | Admitting: Anesthesiology

## 2019-09-27 ENCOUNTER — Encounter (HOSPITAL_COMMUNITY)
Admission: RE | Disposition: A | Discharge status: Home / Self Care | Payer: Self-pay | Source: Home / Self Care | Attending: Internal Medicine

## 2019-09-27 ENCOUNTER — Ambulatory Visit (HOSPITAL_COMMUNITY)
Admission: RE | Admit: 2019-09-27 | Discharge: 2019-09-27 | Disposition: A | Discharge status: Home / Self Care | Payer: Medicare Other | Attending: Internal Medicine | Admitting: Internal Medicine

## 2019-09-27 DIAGNOSIS — M797 Fibromyalgia: Secondary | ICD-10-CM | POA: Insufficient documentation

## 2019-09-27 DIAGNOSIS — K317 Polyp of stomach and duodenum: Secondary | ICD-10-CM | POA: Diagnosis not present

## 2019-09-27 DIAGNOSIS — Z79899 Other long term (current) drug therapy: Secondary | ICD-10-CM | POA: Insufficient documentation

## 2019-09-27 DIAGNOSIS — I480 Paroxysmal atrial fibrillation: Secondary | ICD-10-CM | POA: Insufficient documentation

## 2019-09-27 DIAGNOSIS — I10 Essential (primary) hypertension: Secondary | ICD-10-CM | POA: Diagnosis not present

## 2019-09-27 DIAGNOSIS — G4733 Obstructive sleep apnea (adult) (pediatric): Secondary | ICD-10-CM | POA: Diagnosis not present

## 2019-09-27 DIAGNOSIS — K5190 Ulcerative colitis, unspecified, without complications: Secondary | ICD-10-CM

## 2019-09-27 DIAGNOSIS — K219 Gastro-esophageal reflux disease without esophagitis: Secondary | ICD-10-CM | POA: Diagnosis not present

## 2019-09-27 DIAGNOSIS — R1314 Dysphagia, pharyngoesophageal phase: Secondary | ICD-10-CM | POA: Insufficient documentation

## 2019-09-27 DIAGNOSIS — Z7901 Long term (current) use of anticoagulants: Secondary | ICD-10-CM | POA: Diagnosis not present

## 2019-09-27 DIAGNOSIS — K7460 Unspecified cirrhosis of liver: Secondary | ICD-10-CM | POA: Insufficient documentation

## 2019-09-27 DIAGNOSIS — R933 Abnormal findings on diagnostic imaging of other parts of digestive tract: Secondary | ICD-10-CM

## 2019-09-27 DIAGNOSIS — K449 Diaphragmatic hernia without obstruction or gangrene: Secondary | ICD-10-CM | POA: Insufficient documentation

## 2019-09-27 DIAGNOSIS — E119 Type 2 diabetes mellitus without complications: Secondary | ICD-10-CM | POA: Insufficient documentation

## 2019-09-27 DIAGNOSIS — D509 Iron deficiency anemia, unspecified: Secondary | ICD-10-CM

## 2019-09-27 DIAGNOSIS — Z794 Long term (current) use of insulin: Secondary | ICD-10-CM | POA: Diagnosis not present

## 2019-09-27 DIAGNOSIS — F419 Anxiety disorder, unspecified: Secondary | ICD-10-CM | POA: Diagnosis not present

## 2019-09-27 DIAGNOSIS — K922 Gastrointestinal hemorrhage, unspecified: Secondary | ICD-10-CM

## 2019-09-27 DIAGNOSIS — M1990 Unspecified osteoarthritis, unspecified site: Secondary | ICD-10-CM | POA: Insufficient documentation

## 2019-09-27 DIAGNOSIS — E1140 Type 2 diabetes mellitus with diabetic neuropathy, unspecified: Secondary | ICD-10-CM | POA: Insufficient documentation

## 2019-09-27 DIAGNOSIS — D500 Iron deficiency anemia secondary to blood loss (chronic): Secondary | ICD-10-CM | POA: Insufficient documentation

## 2019-09-27 DIAGNOSIS — K766 Portal hypertension: Secondary | ICD-10-CM | POA: Diagnosis not present

## 2019-09-27 DIAGNOSIS — K3189 Other diseases of stomach and duodenum: Secondary | ICD-10-CM

## 2019-09-27 HISTORY — PX: BIOPSY: SHX5522

## 2019-09-27 HISTORY — PX: MALONEY DILATION: SHX5535

## 2019-09-27 HISTORY — PX: ESOPHAGOGASTRODUODENOSCOPY: SHX1529

## 2019-09-27 HISTORY — PX: ESOPHAGOGASTRODUODENOSCOPY (EGD) WITH PROPOFOL: SHX5813

## 2019-09-27 LAB — GLUCOSE, CAPILLARY
Glucose-Capillary: 88 mg/dL (ref 70–99)
Glucose-Capillary: 133 mg/dL — ABNORMAL HIGH (ref 70–99)

## 2019-09-27 SURGERY — ESOPHAGOGASTRODUODENOSCOPY (EGD) WITH PROPOFOL
Anesthesia: General

## 2019-09-27 MED ORDER — LACTATED RINGERS IV SOLN: INTRAVENOUS | Status: DC | PRN

## 2019-09-27 MED ORDER — PROPOFOL 10 MG/ML IV BOLUS
INTRAVENOUS | Status: AC
  Filled 2019-09-27: qty 60

## 2019-09-27 MED ORDER — PROPOFOL 10 MG/ML IV BOLUS
INTRAVENOUS | Status: DC | PRN
  Administered 2019-09-27: 20 mg via INTRAVENOUS
  Administered 2019-09-27: 40 mg via INTRAVENOUS
  Administered 2019-09-27: 20 mg via INTRAVENOUS

## 2019-09-27 MED ORDER — KETAMINE HCL 50 MG/5ML IJ SOSY
PREFILLED_SYRINGE | INTRAMUSCULAR | Status: AC
  Filled 2019-09-27: qty 5

## 2019-09-27 MED ORDER — DEXTROSE 50 % IV SOLN
12.5000 g | Freq: Once | INTRAVENOUS | Status: AC
  Administered 2019-09-27: 12.5 g via INTRAVENOUS

## 2019-09-27 MED ORDER — CHLORHEXIDINE GLUCONATE CLOTH 2 % EX PADS: 6.0000 | MEDICATED_PAD | Freq: Once | CUTANEOUS | Status: DC

## 2019-09-27 MED ORDER — LACTATED RINGERS IV SOLN: Freq: Once | INTRAVENOUS | Status: AC

## 2019-09-27 MED ORDER — PROPOFOL 500 MG/50ML IV EMUL
INTRAVENOUS | Status: DC | PRN
  Administered 2019-09-27: 150 ug/kg/min via INTRAVENOUS

## 2019-09-27 MED ORDER — KETAMINE HCL 10 MG/ML IJ SOLN
INTRAMUSCULAR | Status: DC | PRN
  Administered 2019-09-27: 5 mg via INTRAVENOUS
  Administered 2019-09-27: 10 mg via INTRAVENOUS

## 2019-09-27 MED ORDER — DEXTROSE 50 % IV SOLN
INTRAVENOUS | Status: AC
  Filled 2019-09-27: qty 50

## 2019-09-27 MED ORDER — ONDANSETRON HCL 4 MG/2ML IJ SOLN
INTRAMUSCULAR | Status: DC | PRN
  Administered 2019-09-27: 4 mg via INTRAVENOUS

## 2019-09-27 NOTE — Progress Notes (Signed)
Dr Charna Elizabeth notified of fingerstick blood sugar 88. No new orders given.

## 2019-09-27 NOTE — Anesthesia Procedure Notes (Signed)
Procedure Name: MAC Date/Time: 09/27/2019 12:44 PM Performed by: Vista Deck, CRNA Pre-anesthesia Checklist: Patient identified, Emergency Drugs available, Suction available, Timeout performed and Patient being monitored Patient Re-evaluated:Patient Re-evaluated prior to induction Oxygen Delivery Method: Nasal Cannula

## 2019-09-27 NOTE — Anesthesia Preprocedure Evaluation (Addendum)
Anesthesia Evaluation  Patient identified by MRN, date of birth, ID band Patient awake    Reviewed: Allergy & Precautions, NPO status , Patient's Chart, lab work & pertinent test results, reviewed documented beta blocker date and time   History of Anesthesia Complications (+) PONV and history of anesthetic complications  Airway Mallampati: III  TM Distance: >3 FB Neck ROM: Full    Dental  (+) Caps, Dental Advisory Given   Pulmonary shortness of breath and with exertion, sleep apnea and Continuous Positive Airway Pressure Ventilation ,    Pulmonary exam normal breath sounds clear to auscultation       Cardiovascular METS: 3 - Mets hypertension, Pt. on medications and Pt. on home beta blockers + dysrhythmias Atrial Fibrillation  Rhythm:Irregular Rate:Normal     Neuro/Psych Anxiety  Neuromuscular disease    GI/Hepatic PUD, GERD  Medicated and Controlled,(+) Hepatitis -Fatty liver   Endo/Other  diabetes, Well Controlled, Type 2, Insulin DependentMorbid obesity  Renal/GU      Musculoskeletal  (+) Arthritis , Osteoarthritis,  Fibromyalgia -  Abdominal   Peds  Hematology  (+) anemia ,   Anesthesia Other Findings   Reproductive/Obstetrics                            Anesthesia Physical Anesthesia Plan  ASA: III  Anesthesia Plan: General   Post-op Pain Management:    Induction: Intravenous  PONV Risk Score and Plan: TIVA  Airway Management Planned: Nasal Cannula, Natural Airway and Simple Face Mask  Additional Equipment:   Intra-op Plan:   Post-operative Plan:   Informed Consent: I have reviewed the patients History and Physical, chart, labs and discussed the procedure including the risks, benefits and alternatives for the proposed anesthesia with the patient or authorized representative who has indicated his/her understanding and acceptance.     Dental advisory given  Plan  Discussed with: CRNA  Anesthesia Plan Comments:         Anesthesia Quick Evaluation

## 2019-09-27 NOTE — Transfer of Care (Signed)
Immediate Anesthesia Transfer of Care Note  Patient: [name]  Procedure(s) Performed: ESOPHAGOGASTRODUODENOSCOPY (EGD) WITH PROPOFOL (N/A ) MALONEY DILATION BIOPSY  Patient Location: PACU  Anesthesia Type:General  Level of Consciousness: awake and patient cooperative  Airway & Oxygen Therapy: Patient Spontanous Breathing  Post-op Assessment: Report given to RN and Post -op Vital signs reviewed and stable  Post vital signs: Reviewed and stable  Last Vitals:  Vitals Value Taken Time  BP    Temp 98.3   Pulse 63 09/27/19 1314  Resp    SpO2 86 % 09/27/19 1314  Vitals shown include unvalidated device data.  Last Pain:  Vitals:   09/27/19 1248  PainSc: 0-No pain         Complications: No apparent anesthesia complications

## 2019-09-27 NOTE — Op Note (Signed)
[facility] Patient Name: [name] Procedure Date: 09/27/2019 12:29 PM MRN: [id number] Date of Birth: [birth year] Attending MD: [name] , MD CSN: [id number] Age: 73 Admit Type: Outpatient Procedure:                Upper GI endoscopy Indications:              Iron deficiency anemia secondary to chronic blood                            loss, Esophageal dysphagia, Abnormal video capsule                            endoscopy Providers:                [name], MD, Otis Peak B. Sharon Seller, RN, Raphael Gibney, Technician Referring MD:             Tammi Sou, MD Medicines:                Propofol per Anesthesia Complications:            No immediate complications. Estimated Blood Loss:     Estimated blood loss was minimal. Procedure:                Pre-Anesthesia Assessment:                           - Prior to the procedure, a History and Physical                            was performed, and patient medications and                            allergies were reviewed. The patient's tolerance of                            previous anesthesia was also reviewed. The risks                            and benefits of the procedure and the sedation                            options and risks were discussed with the patient.                            All questions were answered, and informed consent                            was obtained. Prior Anticoagulants: The patient                            last took Xarelto (rivaroxaban) 5 days prior to the  procedure. ASA Grade Assessment: III - A patient                            with severe systemic disease. After reviewing the                            risks and benefits, the patient was deemed in                            satisfactory condition to undergo the procedure.                           After obtaining informed consent, the endoscope was                             passed under direct vision. Throughout the                            procedure, the patient's blood pressure, pulse, and                            oxygen saturations were monitored continuously. The                            [id number]-H190 ([id number]) was introduced through the                            mouth, and advanced to the second part of duodenum.                            The upper GI endoscopy was accomplished without                            difficulty. The patient tolerated the procedure                            well. Scope In: 12:50:45 PM Scope Out: 1:08:03 PM Total Procedure Duration: 0 hours 17 minutes 18 seconds  Findings:      The examined esophagus was normal.      The Z-line was regular and was found 36 cm from the incisors.      A 2 cm hiatal hernia was present.      No endoscopic abnormality was evident in the esophagus to explain the       patient's complaint of dysphagia. It was decided, however, to proceed       with dilation of the entire esophagus. The scope was withdrawn. Dilation       was performed with a Maloney dilator with no resistance at 24 Fr. The       dilation site was examined following endoscope reinsertion and showed no       change and no bleeding, mucosal tear or perforation.      Mild portal hypertensive gastropathy was found in the gastric fundus and       in the gastric body.      Two small sessile polyps with  no bleeding and no stigmata of recent       bleeding were found in the gastric fundus. Biopsies were taken with a       cold forceps for histology. The pathology specimen was placed into       Bottle Number 2.      The exam of the stomach was otherwise normal.      The duodenal bulb was normal.      Diffuse granular mucosa was found in the second portion of the duodenum.       This was biopsied with a cold forceps for histology. The pathology       specimen was placed into Bottle Number 1. Impression:               - Normal  esophagus.                           - Z-line regular, 36 cm from the incisors.                           - 2 cm hiatal hernia.                           - No endoscopic esophageal abnormality to explain                            patient's dysphagia. Esophagus dilated. Dilated.                           - Portal hypertensive gastropathy.                           - Two gastric polyps at fundus. One with surface                            erosion. Biopsied.                           - Normal duodenal bulb.                           - Granular mucosa in the second portion of the                            duodenum. Biopsied. Moderate Sedation:      Per Anesthesia Care Recommendation:           - Patient has a contact number available for                            emergencies. The signs and symptoms of potential                            delayed complications were discussed with the                            patient. Return to normal activities tomorrow.  Written discharge instructions were provided to the                            patient.                           - Resume previous diet today.                           - Continue present medications.                           - Resume Xarelto (rivaroxaban) at prior dose in 2                            days.                           - Await pathology results. Procedure Code(s):        --- Professional ---                           [phone], Esophagogastroduodenoscopy, flexible,                            transoral; with biopsy, single or multiple                           43450, Dilation of esophagus, by unguided sound or                            bougie, single or multiple passes Diagnosis Code(s):        --- Professional ---                           K44.9, Diaphragmatic hernia without obstruction or                            gangrene                           K76.6, Portal hypertension                            K31.89, Other diseases of stomach and duodenum                           K31.7, Polyp of stomach and duodenum                           D50.0, Iron deficiency anemia secondary to blood                            loss (chronic)                           R13.14, Dysphagia, pharyngoesophageal phase  R93.3, Abnormal findings on diagnostic imaging of                            other parts of digestive tract CPT copyright 2019 [employer]. All rights reserved. The codes documented in this report are preliminary and upon coder review may  be revised to meet current compliance requirements. [name], MD [name], MD 09/27/2019 1:21:36 PM This report has been signed electronically. Number of Addenda: 0

## 2019-09-27 NOTE — H&P (Signed)
[name] is an 73 y.o. female.   Chief Complaint: Patient is here for esophagogastroduodenoscopy and possible esophageal dilation. HPI: Patient is 73 year old Caucasian female with multiple medical problems including diabetes mellitus atrial fibrillation on anticoagulant chronic ulcerative colitis who evaluated earlier this year for iron deficiency anemia.  She underwent colonoscopy.  He was felt to be in remission.  Her colonoscopy was in September 2020.  She returned for small bowel given capsule study.  She was noted to have some food debris in the stomach and small amount of blood.  Therefore recommended esophagogastroduodenoscopy to complete her work-up.  She has responded to iron therapy.  She also complains of dysphagia to pills and solids.  She points to suprasternal area and mid chest at site of bolus obstruction.  She has good appetite and her weight has been stable. She has been off Xarelto for 5 days.  Past Medical History:  Diagnosis Date  . Anxiety   . 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 Cirrhosis, nonalcoholic ([place]) 23/5361   NASH--> early cirrhotic changes on ultrasound 07/2018. ? to get liver bx if she gets bariatric surgery?  . Dietary iron deficiency without anemia 12/2018   Possibly inadequate absorption secondary to chronic/long term PPI therapy.  flintstone vitamin bid started end of april 2020.  Colonoscopy ok 2020.  Capsule endoscopy ->set for 08/03/19.  . Fibromyalgia   . GERD   . History of thrombocytopenia 12/2011  . Hyperlipidemia    Intolerant of statins  . HYPERTENSION   . IBS (irritable bowel syndrome)    -D.  Good response to bentyl and imodium as of 06/2018 GI f/u.  Marland Kitchen IDDM (insulin dependent diabetes mellitus)    with DPN (managed by Dr. Cruzita Lederer but then in 2018 pt preferred to have me manage for her convenience)  . Limited mobility    Requires a walker for arthritic pain, widespread musculoskeletal pain, and neuropathic pain.  .  Morbid obesity ([place])    As of 11/2018, pt considering sleeve gastectomy vs bipass as of eval by Dr. Lucia Gaskins: next steps are labs/xrays, psych and nutrit consults, and cardiology clearance.  . Nonalcoholic steatohepatitis    Viral Hep screens NEG.  CT 2015.  Transaminasemia.  U/S 07/2018 showed early changes of cirrhosis.  . OSA (obstructive sleep apnea) 09/14/2015   sleep study 09/07/15: severe obstructive sleep apnea with an AHI of 72 and SaO2 low of 75%.>refer to sleep med for eval and tx options  . Osteoarthritis    hips, shoulders, knees  . PAF (paroxysmal atrial fibrillation) ([place])    One documented episode (after getting EGD 2016).  Was on amiodarone x 3 mo.  Rate control with metoprolol + anticoag with xarelto.   Marland Kitchen PONV (postoperative nausea and vomiting)   . Small fiber neuropathy    Due to DM.  Symmetric hands and feet tingling/numbness.  Marland Kitchen Ulcerative colitis ([place])    Remicade infusion Q 8 weeks: in clinical and endoscopic remission as of 12/2018 GI f/u.  06/11/19 rpt colonoscopy->cecal and ascending colon colitis.    Past Surgical History:  Procedure Laterality Date  . ABDOMINAL HYSTERECTOMY  1980   Paps no longer indicated.  Marland Kitchen BACTERIAL OVERGROWTH TEST N/A 07/13/2015   Procedure: BACTERIAL OVERGROWTH TEST;  Surgeon: Rogene Houston, MD;  [facility];  Service: Endoscopy;  Laterality: N/A;  730    . BILATERAL SALPINGOOPHORECTOMY  02/10/2001  . BIOPSY  06/11/2019   Procedure: BIOPSY;  Surgeon: Rogene Houston, MD;  [facility];  Service: Endoscopy;;  colon  . BREAST REDUCTION SURGERY  1994   bilat  . CARDIOVASCULAR STRESS TEST  07/2010   Lexiscan myoview: normal  . CARPAL TUNNEL RELEASE Right 1996  . CARPAL TUNNEL RELEASE Left 03/21/2003  . CARPAL TUNNEL RELEASE Right 05/04/2013   Procedure: CARPAL TUNNEL RELEASE;  Surgeon: Cammie Sickle., MD;  Location: [facility];  Service: Orthopedics;  Laterality: Right;  . CARPAL TUNNEL RELEASE Left  09/21/2013   Procedure: LEFT CARPAL TUNNEL RELEASE;  Surgeon: Cammie Sickle., MD;  Location: [facility];  Service: Orthopedics;  Laterality: Left;  . CHOLECYSTECTOMY    . COLONOSCOPY WITH PROPOFOL N/A 08/04/2015   Colitis in remission.  No polyps.  Procedure: COLONOSCOPY WITH PROPOFOL;  Surgeon: Rogene Houston, MD;  [facility]: [facility];  Service: Endoscopy;  Laterality: N/A;  cecum time in  0820   time out  0827    total time 7 minutes  . COLONOSCOPY WITH PROPOFOL N/A 06/11/2019   cecal and ascending colon colitis.  Procedure: COLONOSCOPY WITH PROPOFOL;  Surgeon: Rogene Houston, MD;  [facility];  Service: Endoscopy;  Laterality: N/A;  730a  . ESOPHAGEAL DILATION N/A 08/04/2015   Procedure: ESOPHAGEAL DILATION;  Surgeon: Rogene Houston, MD;  [facility]: [facility];  Service: Endoscopy;  Laterality: N/A;  Maloney 56, no mucousal disruption  . ESOPHAGOGASTRODUODENOSCOPY (EGD) WITH ESOPHAGEAL DILATION  12/02/2005  . ESOPHAGOGASTRODUODENOSCOPY (EGD) WITH PROPOFOL N/A 08/04/2015   Procedure: ESOPHAGOGASTRODUODENOSCOPY (EGD) WITH PROPOFOL;  Surgeon: Rogene Houston, MD;  [facility];  Service: Endoscopy;  Laterality: N/A;  procedure 1  . FLEXIBLE SIGMOIDOSCOPY  01/17/2012   Procedure: FLEXIBLE SIGMOIDOSCOPY;  Surgeon: Rogene Houston, MD;  [facility];  Service: Endoscopy;  Laterality: N/A;  . GIVENS CAPSULE STUDY N/A 08/03/2019   Procedure: GIVENS CAPSULE STUDY;  Surgeon: Rogene Houston, MD;  [facility];  Service: Endoscopy;  Laterality: N/A;  730AM  . HEMILAMINOTOMY LUMBAR SPINE Bilateral 09/07/1999   L4-5  . KNEE ARTHROSCOPY Right 01/1999; 10/2000  . LYSIS OF ADHESION  02/10/2001  . [place]; 09/12/2006  . TARSAL TUNNEL RELEASE  2002  . TRANSTHORACIC ECHOCARDIOGRAM  08/04/2015   EF 60-65%, normal wall motion, mild LVH, mild LA dilation, grd I DD.  Marland Kitchen TUMOR EXCISION Left 03/21/2003   dorsal 1st web space (hand)  . URETEROLYSIS Right  02/10/2001    Family History  Problem Relation Age of Onset  . Diabetes Mother   . Hypertension Mother   . Heart attack Father        Mid 49's  . Heart disease Father   . Lung disease Father        spot on lung; had lung surgery  . Alcohol abuse Other   . Hypertension Son   . Diabetes Son   . Emphysema Maternal Grandfather   . Asthma Maternal Grandfather   . Colon cancer Paternal Grandfather   . Stomach cancer Paternal Grandmother   . Neuropathy Neg Hx    Social History:  reports that she has never smoked. She has never used smokeless tobacco. She reports current alcohol use. She reports that she does not use drugs.  Allergies:  Allergies  Allergen Reactions  . Omeprazole Anaphylaxis and Swelling    SWELLING OF TONGUE AND THROAT  . Actos [Pioglitazone] Other (See Comments)    Weight gain  .  Benzocaine-Menthol Swelling    SWELLING OF MOUTH  . Colesevelam Other (See Comments)    GI UPSET  . Flagyl [Metronidazole Hcl] Other (See Comments)    DIAPHORESIS  . Metformin And Related Diarrhea  . Shrimp [Shellfish Allergy] Itching    OF THROAT AND EARS  . Statins Palpitations  . Desipramine Hcl Itching, Nausea Only and Other (See Comments)    "swimmy" headed, ears itched   . Hydromorphone Itching  . Jardiance [Empagliflozin] Other (See Comments)    weakness  . Lactose Intolerance (Gi) Diarrhea    Gas, bloating  . Adhesive [Tape] Other (See Comments)    SKIN IRRITATION AND BRUISING  . Nisoldipine Itching  . Percocet [Oxycodone-Acetaminophen] Itching    Medications Prior to Admission  Medication Sig Dispense Refill  . acetaminophen (TYLENOL) 500 MG tablet Take 1,000 mg by mouth every 6 (six) hours as needed for moderate pain or headache.    . citalopram (CELEXA) 20 MG tablet TAKE 1 TABLET BY MOUTH EVERY DAY (Patient taking differently: Take 20 mg by mouth daily. ) 90 tablet 0  . Cyanocobalamin (B-12) 5000 MCG CAPS Take 5,000 mcg by mouth daily.    Marland Kitchen dicyclomine (BENTYL)  10 MG capsule TAKE 1 CAPSULE (10 MG TOTAL) BY MOUTH 3 (THREE) TIMES DAILY BEFORE MEALS. 270 capsule 3  . esomeprazole (NEXIUM) 20 MG capsule Take 20 mg by mouth daily at 12 noon.    . furosemide (LASIX) 40 MG tablet TAKE 1 TABLET BY MOUTH EVERY DAY (Patient taking differently: Take 40 mg by mouth daily. ) 90 tablet 1  . gabapentin (NEURONTIN) 600 MG tablet 1.5 tabs po bid (Patient taking differently: Take 900 mg by mouth 2 (two) times daily. ) 270 tablet 3  . HUMULIN R U-500 KWIKPEN 500 UNIT/ML kwikpen INJECT 123U SUBCUTANEOUSLY AT BREAKFAST, 133U MID-DAY. 117U AT SUPPER. IF SUGAR IS GREATER THAN 200 AT BEDTIME INJECT AN ADDITIONAL 25U. (Patient taking differently: Inject 35-135 Units into the skin See admin instructions. Inject 125 units subcutaneously at breakfast and at lunch, 135 units at supper. If sugar is greater than 200 at bedtime inject an additional 35 units.) 60 mL 2  . inFLIXimab (REMICADE) 100 MG injection Inject 100 mg into the vein every 8 (eight) weeks.     Marland Kitchen KLOR-CON M10 10 MEQ tablet TAKE 2 TABLETS (20 MEQ TOTAL) BY MOUTH DAILY. -- OFFICE VISIT NEEDED FOR FURTHER REFILLS (Patient taking differently: Take 20 mEq by mouth daily. ) 180 tablet 0  . lactase (LACTAID) 3000 UNITS tablet Take 3,000 Units by mouth as needed (when eating foods containing dairy).     Marland Kitchen loperamide (IMODIUM) 2 MG capsule Take 2-4 mg by mouth as needed for diarrhea or loose stools.  30 capsule   . meclizine (ANTIVERT) 25 MG tablet Take 1 tablet (25 mg total) by mouth 3 (three) times daily as needed for dizziness or nausea. 90 tablet 0  . metoprolol succinate (TOPROL-XL) 100 MG 24 hr tablet TAKE 1 TABLET BY MOUTH 2 TIMES DAILY. TAKE WITH OR IMMEDIATELY FOLLOWING A MEAL (Patient taking differently: Take 100 mg by mouth 2 (two) times daily. ) 180 tablet 1  . Multiple Vitamin (MULTIVITAMIN WITH MINERALS) TABS tablet Take 1 tablet by mouth daily.    . TRULICITY 1.5 DP/8.2UM SOPN INJECT UNDER SKIN 1.5 MG WEEKLY UNDER  SKIN. (Patient taking differently: Inject 1.5 mg as directed every Thursday. ) 12 pen 3  . XARELTO 20 MG TABS tablet TAKE 1 TABLET (20 MG TOTAL) BY  MOUTH DAILY WITH SUPPER. (Patient taking differently: Take 20 mg by mouth daily with supper. ) 90 tablet 1  . hydrocortisone (PROCTOSOL HC) 2.5 % rectal cream Place 1 application rectally 2 (two) times daily. (Patient not taking: Reported on 09/14/2019) 30 g 2  . Insulin Pen Needle (B-D UF III MINI PEN NEEDLES) 31G X 5 MM MISC USE TO INJECT INSULINS EQUAL TO 6 TIMES DAILY. 600 each 5  . Lancets (ONETOUCH DELICA PLUS [id number]) MISC USE AS DIRECTED TO CHECK BLOOD SUGAR UP TO 6 TIMES PER DAY. DX: E11.09 500 each 3  . ONETOUCH ULTRA test strip USE AS INSTRUCTED TO CHECK SUGAR 6 TIMES DAILY 400 strip 5  . Pediatric Multivitamins-Iron (FLINTSTONES PLUS IRON) chewable tablet Chew 1 tablet by mouth 2 (two) times daily. (Patient not taking: Reported on 09/14/2019)      Results for orders placed or performed during the hospital encounter of 09/27/19 (from the past 48 hour(s))  Glucose, capillary     Status: None   Collection Time: 09/27/19 12:02 PM  Result Value Ref Range   Glucose-Capillary 88 70 - 99 mg/dL   No results found.  Review of Systems  Blood pressure (!) 127/53, pulse 70, temperature 97.8 F (36.6 C), resp. rate 16, SpO2 99 %. Physical Exam  Constitutional: She appears well-developed and well-nourished.  HENT:  Mouth/Throat: Oropharynx is clear and moist.  Eyes: Conjunctivae are normal. No scleral icterus.  Neck: No thyromegaly present.  Cardiovascular: Normal rate, regular rhythm and normal heart sounds.  No murmur heard. Respiratory: Effort normal and breath sounds normal.  GI:  Abdomen is full but soft and nontender with organomegaly or masses  Musculoskeletal:     Comments: She has both pitting and nonpitting edema involving both legs.  Lymphadenopathy:    She has no cervical adenopathy.  Neurological: She is alert.  Skin: Skin  is warm and dry.     Assessment/Plan History of iron deficiency anemia and heme positive stool. Abnormal small bowel given capsule study in reference to stomach. Esophageal dysphagia. Esophagogastroduodenoscopy with esophageal dilation.  [name], MD 09/27/2019, 12:39 PM

## 2019-09-27 NOTE — Anesthesia Postprocedure Evaluation (Signed)
Anesthesia Post Note  Patient: [name]  Procedure(s) Performed: ESOPHAGOGASTRODUODENOSCOPY (EGD) WITH PROPOFOL (N/A ) [place]  Patient location during evaluation: Phase II Anesthesia Type: General Level of consciousness: awake and alert and patient cooperative Pain management: satisfactory to patient Vital Signs Assessment: post-procedure vital signs reviewed and stable Respiratory status: spontaneous breathing Cardiovascular status: stable Postop Assessment: no apparent nausea or vomiting Anesthetic complications: no     Last Vitals:  Vitals:   09/27/19 1330 09/27/19 1344  BP: (!) 108/53 (!) 141/62  Pulse: 70 64  Resp: (!) 21 20  Temp:  (!) 36.3 C  SpO2: 95% 95%    Last Pain:  Vitals:   09/27/19 1344  TempSrc: Oral  PainSc: 0-No pain                 Jadden Yim

## 2019-09-27 NOTE — Discharge Instructions (Signed)
Resume Xarelto on 09/29/2019 Resume other medications as before Resume usual diet. No driving for 24 hours. Physician will call with biopsy results.   Upper Endoscopy, Adult, Care After This sheet gives you information about how to care for yourself after your procedure. Your health care provider may also give you more specific instructions. If you have problems or questions, contact your health care provider. What can I expect after the procedure? After the procedure, it is common to have:  A sore throat.  Mild stomach pain or discomfort.  Bloating.  Nausea. Follow these instructions at home:   Follow instructions from your health care provider about what to eat or drink after your procedure.  Return to your normal activities as told by your health care provider. Ask your health care provider what activities are safe for you.  Take over-the-counter and prescription medicines only as told by your health care provider.  Do not drive for 24 hours if you were given a sedative during your procedure.  Keep all follow-up visits as told by your health care provider. This is important. Contact a health care provider if you have:  A sore throat that lasts longer than one day.  Trouble swallowing. Get help right away if:  You vomit blood or your vomit looks like coffee grounds.  You have: ? A fever. ? Bloody, black, or tarry stools. ? A severe sore throat or you cannot swallow. ? Difficulty breathing. ? Severe pain in your chest or abdomen. Summary  After the procedure, it is common to have a sore throat, mild stomach discomfort, bloating, and nausea.  Do not drive for 24 hours if you were given a sedative during the procedure.  Follow instructions from your health care provider about what to eat or drink after your procedure.  Return to your normal activities as told by your health care provider. This information is not intended to replace advice given to you by your  health care provider. Make sure you discuss any questions you have with your health care provider. Document Released: 03/17/2012 Document Revised: 03/10/2018 Document Reviewed: 02/16/2018 Elsevier Patient Education  2020 [place].    Esophageal Dilatation Esophageal dilatation, also called esophageal dilation, is a procedure to widen or open (dilate) a blocked or narrowed part of the esophagus. The esophagus is the part of the body that moves food and liquid from the mouth to the stomach. You may need this procedure if:  You have a buildup of scar tissue in your esophagus that makes it difficult, painful, or impossible to swallow. This can be caused by gastroesophageal reflux disease (GERD).  You have cancer of the esophagus.  There is a problem with how food moves through your esophagus. In some cases, you may need this procedure repeated at a later time to dilate the esophagus gradually. Tell a health care provider about:  Any allergies you have.  All medicines you are taking, including vitamins, herbs, eye drops, creams, and over-the-counter medicines.  Any problems you or family members have had with anesthetic medicines.  Any blood disorders you have.  Any surgeries you have had.  Any medical conditions you have.  Any antibiotic medicines you are required to take before dental procedures.  Whether you are pregnant or may be pregnant. What are the risks? Generally, this is a safe procedure. However, problems may occur, including:  Bleeding due to a tear in the lining of the esophagus.  A hole (perforation) in the esophagus. What happens before the  procedure?  Follow instructions from your health care provider about eating or drinking restrictions.  Ask your health care provider about changing or stopping your regular medicines. This is especially important if you are taking diabetes medicines or blood thinners.  Plan to have someone take you home from the hospital  or clinic.  Plan to have a responsible adult care for you for at least 24 hours after you leave the hospital or clinic. This is important. What happens during the procedure?  You may be given a medicine to help you relax (sedative).  A numbing medicine may be sprayed into the back of your throat, or you may gargle the medicine.  Your health care provider may perform the dilatation using various surgical instruments, such as: ? Simple dilators. This instrument is carefully placed in the esophagus to stretch it. ? Guided wire bougies. This involves using an endoscope to insert a wire into the esophagus. A dilator is passed over this wire to enlarge the esophagus. Then the wire is removed. ? Balloon dilators. An endoscope with a small balloon at the end is inserted into the esophagus. The balloon is inflated to stretch the esophagus and open it up. The procedure may vary among health care providers and hospitals. What happens after the procedure?  Your blood pressure, heart rate, breathing rate, and blood oxygen level will be monitored until the medicines you were given have worn off.  Your throat may feel slightly sore and numb. This will improve slowly over time.  You will not be allowed to eat or drink until your throat is no longer numb.  When you are able to drink, urinate, and sit on the edge of the bed without nausea or dizziness, you may be able to return home. Follow these instructions at home:  Take over-the-counter and prescription medicines only as told by your health care provider.  Do not drive for 24 hours if you were given a sedative during your procedure.  You should have a responsible adult with you for 24 hours after the procedure.  Follow instructions from your health care provider about any eating or drinking restrictions.  Do not use any products that contain nicotine or tobacco, such as cigarettes and e-cigarettes. If you need help quitting, ask your health care  provider.  Keep all follow-up visits as told by your health care provider. This is important. Get help right away if you:  Have a fever.  Have chest pain.  Have pain that is not relieved by medication.  Have trouble breathing.  Have trouble swallowing.  Vomit blood. Summary  Esophageal dilatation, also called esophageal dilation, is a procedure to widen or open (dilate) a blocked or narrowed part of the esophagus.  Plan to have someone take you home from the hospital or clinic.  For this procedure, a numbing medicine may be sprayed into the back of your throat, or you may gargle the medicine.  Do not drive for 24 hours if you were given a sedative during your procedure. This information is not intended to replace advice given to you by your health care provider. Make sure you discuss any questions you have with your health care provider. Document Released: 11/07/2005 Document Revised: 08/29/2017 Document Reviewed: 07/22/2017 Elsevier Patient Education  2020 [place] After These instructions provide you with information about caring for yourself after your procedure. Your health care provider may also give you more specific instructions. Your treatment has been planned according  to current medical practices, but problems sometimes occur. Call your health care provider if you have any problems or questions after your procedure. What can I expect after the procedure? After your procedure, you may:  Feel sleepy for several hours.  Feel clumsy and have poor balance for several hours.  Feel forgetful about what happened after the procedure.  Have poor judgment for several hours.  Feel nauseous or vomit.  Have a sore throat if you had a breathing tube during the procedure. Follow these instructions at home: For at least 24 hours after the procedure:      Have a responsible adult stay with you. It is important to have someone help  care for you until you are awake and alert.  Rest as needed.  Do not: ? Participate in activities in which you could fall or become injured. ? Drive. ? Use heavy machinery. ? Drink alcohol. ? Take sleeping pills or medicines that cause drowsiness. ? Make important decisions or sign legal documents. ? Take care of children on your own. Eating and drinking  Follow the diet that is recommended by your health care provider.  If you vomit, drink water, juice, or soup when you can drink without vomiting.  Make sure you have little or no nausea before eating solid foods. General instructions  Take over-the-counter and prescription medicines only as told by your health care provider.  If you have sleep apnea, surgery and certain medicines can increase your risk for breathing problems. Follow instructions from your health care provider about wearing your sleep device: ? Anytime you are sleeping, including during daytime naps. ? While taking prescription pain medicines, sleeping medicines, or medicines that make you drowsy.  If you smoke, do not smoke without supervision.  Keep all follow-up visits as told by your health care provider. This is important. Contact a health care provider if:  You keep feeling nauseous or you keep vomiting.  You feel light-headed.  You develop a rash.  You have a fever. Get help right away if:  You have trouble breathing. Summary  For several hours after your procedure, you may feel sleepy and have poor judgment.  Have a responsible adult stay with you for at least 24 hours or until you are awake and alert. This information is not intended to replace advice given to you by your health care provider. Make sure you discuss any questions you have with your health care provider. Document Released: 01/07/2016 Document Revised: 12/15/2017 Document Reviewed: 01/07/2016 Elsevier Patient Education  2020 [employer].

## 2019-09-28 ENCOUNTER — Encounter: Payer: Self-pay | Admitting: Family Medicine

## 2019-09-28 ENCOUNTER — Other Ambulatory Visit: Payer: Self-pay

## 2019-09-28 LAB — SURGICAL PATHOLOGY

## 2019-09-29 ENCOUNTER — Other Ambulatory Visit: Payer: Self-pay | Admitting: Family Medicine

## 2019-10-04 ENCOUNTER — Encounter: Payer: Self-pay | Admitting: Dietician

## 2019-10-04 ENCOUNTER — Encounter: Payer: Medicare PPO | Attending: Surgery | Admitting: Dietician

## 2019-10-04 ENCOUNTER — Other Ambulatory Visit: Payer: Self-pay

## 2019-10-04 DIAGNOSIS — I10 Essential (primary) hypertension: Secondary | ICD-10-CM | POA: Insufficient documentation

## 2019-10-04 DIAGNOSIS — E1169 Type 2 diabetes mellitus with other specified complication: Secondary | ICD-10-CM | POA: Insufficient documentation

## 2019-10-04 DIAGNOSIS — E669 Obesity, unspecified: Secondary | ICD-10-CM | POA: Insufficient documentation

## 2019-10-04 NOTE — Patient Instructions (Addendum)
Remember your goals for this next month:   Practice chewing your food very thoroughly before swallowing  Practice not drinking anything before, during, and after meals and snacks    See you next month!

## 2019-10-04 NOTE — Progress Notes (Signed)
Bariatric Supervised Weight Loss Visit Appt Start Time: 2:15pm   End Time: 2:30pm  Planned Surgery: Sleeve   5th out of 6 SWL Appointments     NUTRITION ASSESSMENT  Anthropometrics  Start weight at [facility]: 226 lbs  Today's weight: 225 lbs Weight change: -5.5 lbs (since previous visit on 09/06/2019) BMI: 42.5 kg/m2    Clinical  Medical Hx: diabetes, obesity Medications: see list   Lifestyle & Dietary Hx Patient states her diet has somewhat changed over this past month. States her husband was in the hospital around Christmas and has since changed his diet, so patient has also. States she only drinks water throughout the day plus 1 diet soda. Been working on reducing portion sizes and continues to do well with this. This month, plans to focus on chewing thoroughly and not drinking with meals/snacks.   24-Hr Dietary Recall First Meal: bacon + egg + english muffin  Snack: - Second Meal: salad + tuna + fruit  Snack: - Third Meal: grilled chicken breast + 2 vegetables   Snack: - Beverages: water, diet soda   Estimated Energy Needs Calories: 1600 Carbohydrate: 180g Protein: 100g Fat: 53g   NUTRITION DIAGNOSIS  Overweight/obesity ([place]-3.3) related to past poor dietary habits and physical inactivity as evidenced by patient w/ planned Sleeve Gastrectomy surgery following dietary guidelines for continued weight loss.   NUTRITION INTERVENTION  Nutrition counseling (C-1) and education (E-2) to facilitate bariatric surgery goals.  Pre-Op Goals Progress & New Goals . Logging food and beverage intake  . Checking blood sugar (fasting and at nighttime, not checking 2 hours after meals)  . Choosing healthier snacks  . Working on increasing fluid intake  . Found protein drink flavors you enjoy  . Monitoring portion sizes  . NEW: Practice chewing food thoroughly  . NEW: Practice not drinking with meals   Learning Style & Readiness for Change Teaching method utilized: Visual & Auditory   Demonstrated degree of understanding via: Teach Back  Barriers to learning/adherence to lifestyle change: None Identified    MONITORING & EVALUATION Dietary intake, weekly physical activity, body weight, and pre-op goals in 1 month.   Next Steps  Patient is to return to [facility] in 1 month for 6th SWL visit.

## 2019-10-21 ENCOUNTER — Telehealth (INDEPENDENT_AMBULATORY_CARE_PROVIDER_SITE_OTHER): Payer: Self-pay | Admitting: *Deleted

## 2019-10-21 NOTE — Telephone Encounter (Signed)
Ann - Please fax the patient's last lab and OV to [phone].  They are doing the PA [employer]) due to the fact she gets her [facility] there.Marland Kitchen

## 2019-10-21 NOTE — Telephone Encounter (Signed)
Notes have been faxed

## 2019-10-31 DIAGNOSIS — G4733 Obstructive sleep apnea (adult) (pediatric): Secondary | ICD-10-CM | POA: Diagnosis not present

## 2019-11-03 ENCOUNTER — Ambulatory Visit: Payer: Medicare Other | Admitting: Dietician

## 2019-11-08 DIAGNOSIS — Z79899 Other long term (current) drug therapy: Secondary | ICD-10-CM | POA: Diagnosis not present

## 2019-11-08 DIAGNOSIS — K5180 Other ulcerative colitis without complications: Secondary | ICD-10-CM | POA: Diagnosis not present

## 2019-11-09 ENCOUNTER — Encounter: Payer: Medicare PPO | Attending: Surgery | Admitting: Dietician

## 2019-11-09 ENCOUNTER — Encounter: Payer: Self-pay | Admitting: Dietician

## 2019-11-09 ENCOUNTER — Other Ambulatory Visit: Payer: Self-pay | Admitting: Family Medicine

## 2019-11-09 ENCOUNTER — Other Ambulatory Visit: Payer: Self-pay

## 2019-11-09 DIAGNOSIS — I10 Essential (primary) hypertension: Secondary | ICD-10-CM | POA: Diagnosis not present

## 2019-11-09 DIAGNOSIS — E1169 Type 2 diabetes mellitus with other specified complication: Secondary | ICD-10-CM | POA: Insufficient documentation

## 2019-11-09 DIAGNOSIS — E669 Obesity, unspecified: Secondary | ICD-10-CM | POA: Insufficient documentation

## 2019-11-09 NOTE — Progress Notes (Signed)
Bariatric Supervised Weight Loss Visit Appt Start Time: 12:10pm   End Time: 12:30pm  Planned Surgery: Sleeve   6th out of 6 SWL Appointments     NUTRITION ASSESSMENT  Anthropometrics  Start weight at [facility]: 226 lbs  Today's weight: 227 lbs BMI: 42.9 kg/m2    Clinical  Medical Hx: diabetes, obesity Medications: see list   Lifestyle & Dietary Hx Patient states she has been chewing thoroughly and working on not drinking with meals/snacks. States she likes to have a poptart in the evenings before bed. States her blood sugar levels will be high in the evenings (200s) then very low in the mornings sometimes (40s.) She states her doctor is aware of this.   24-Hr Dietary Recall First Meal: bacon + egg + english muffin  Snack: - Second Meal: salad + tuna + fruit  Snack: - Third Meal: grilled chicken breast + 2 vegetables   Snack: poptart  Beverages: water, diet soda   Estimated Energy Needs Calories: 1600 Carbohydrate: 180g Protein: 100g Fat: 53g   NUTRITION DIAGNOSIS  Overweight/obesity ([place]-3.3) related to past poor dietary habits and physical inactivity as evidenced by patient w/ planned Sleeve Gastrectomy surgery following dietary guidelines for continued weight loss.   NUTRITION INTERVENTION  Nutrition counseling (C-1) and education (E-2) to facilitate bariatric surgery goals.  Pre-Op Goals Progress & New Goals . Logging food and beverage intake  . Checking blood sugar (fasting and at nighttime, not checking 2 hours after meals)  . Choosing healthier snacks  . Working on increasing fluid intake  . Found protein drink flavors you enjoy  . Monitoring portion sizes  . Practicing chewing food thoroughly  . Working on not drinking with meals   Learning Style & Readiness for Change Teaching method utilized: Visual & Auditory  Demonstrated degree of understanding via: Teach Back  Barriers to learning/adherence to lifestyle change: None Identified    MONITORING &  EVALUATION Dietary intake, weekly physical activity, body weight, and pre-op goals at Pre-Op Class.   Next Steps  Patient is to contact [facility] to schedule Pre-Op Class 2 weeks prior to surgery.

## 2019-11-23 ENCOUNTER — Other Ambulatory Visit: Payer: Self-pay | Admitting: Family Medicine

## 2019-12-01 ENCOUNTER — Other Ambulatory Visit: Payer: Self-pay | Admitting: Family Medicine

## 2019-12-18 ENCOUNTER — Other Ambulatory Visit: Payer: Self-pay | Admitting: Family Medicine

## 2019-12-21 ENCOUNTER — Encounter: Payer: Medicare Other | Admitting: Family Medicine

## 2019-12-24 ENCOUNTER — Other Ambulatory Visit: Payer: Self-pay | Admitting: Family Medicine

## 2020-01-03 DIAGNOSIS — K5180 Other ulcerative colitis without complications: Secondary | ICD-10-CM | POA: Diagnosis not present

## 2020-01-05 ENCOUNTER — Telehealth: Payer: Self-pay

## 2020-01-05 ENCOUNTER — Other Ambulatory Visit: Payer: Self-pay

## 2020-01-05 DIAGNOSIS — E118 Type 2 diabetes mellitus with unspecified complications: Secondary | ICD-10-CM

## 2020-01-05 DIAGNOSIS — E1149 Type 2 diabetes mellitus with other diabetic neurological complication: Secondary | ICD-10-CM

## 2020-01-05 MED ORDER — BLOOD GLUCOSE MONITOR KIT: PACK | 0 refills | Status: DC

## 2020-01-05 NOTE — Telephone Encounter (Signed)
Rx printed for glucometer, faxed to pharmacy. Fax confirmation received.

## 2020-01-05 NOTE — Telephone Encounter (Signed)
Yes this is fine.

## 2020-01-05 NOTE — Telephone Encounter (Signed)
Patient is requesting new Rx for accu check glucometer, strips and lancets. Per patient, brand is now covered by insurance.  Please advise, thanks. Meter kit pending

## 2020-01-13 ENCOUNTER — Other Ambulatory Visit: Payer: Self-pay | Admitting: Family Medicine

## 2020-01-13 ENCOUNTER — Ambulatory Visit (INDEPENDENT_AMBULATORY_CARE_PROVIDER_SITE_OTHER): Payer: Medicare PPO | Admitting: Family Medicine

## 2020-01-13 ENCOUNTER — Encounter: Payer: Self-pay | Admitting: Family Medicine

## 2020-01-13 ENCOUNTER — Other Ambulatory Visit: Payer: Self-pay

## 2020-01-13 VITALS — BP 112/69 | HR 66 | Temp 98.3°F | Resp 16 | Ht 61.0 in | Wt 231.2 lb

## 2020-01-13 DIAGNOSIS — K760 Fatty (change of) liver, not elsewhere classified: Secondary | ICD-10-CM | POA: Diagnosis not present

## 2020-01-13 DIAGNOSIS — Z0000 Encounter for general adult medical examination without abnormal findings: Secondary | ICD-10-CM | POA: Diagnosis not present

## 2020-01-13 DIAGNOSIS — E611 Iron deficiency: Secondary | ICD-10-CM | POA: Diagnosis not present

## 2020-01-13 DIAGNOSIS — I10 Essential (primary) hypertension: Secondary | ICD-10-CM

## 2020-01-13 DIAGNOSIS — E118 Type 2 diabetes mellitus with unspecified complications: Secondary | ICD-10-CM

## 2020-01-13 NOTE — Progress Notes (Signed)
Office Note 01/16/2020  CC:  Chief Complaint  Patient presents with  . Follow-up    RCI, pt is not fasting    HPI:  [name] is a 74 y.o. White female who is here for health maintenance exam but when she got here she thought it was for f/u chronic illnesses so we changed the visit to this: f/u DM 2, HTN, morb obesity, NAFLD, iron def anemia from chronic GI blood loss.  She is feeling well.  DM:  125, 135, 125 of U-500.   Some fasting hypoglycemia, as low as 40-s-50s at times. She recalls giving her insulin as regular but not eating and this resulted in the hypoglycemia. When >200 after supper she takes an extra 30 U and this consistently causes morning hypoglycema. Her glucoses are very hard to track any pattern from: some low, some normal, some 200-300 range, could be any time of day but tends to more consistently be elevated in evening.  Feet: from knees down she feels her legs give way. No numbness or tingling in feet but feels some tingling in fingertips. Gabapentin helps.  She does have osteoarthritic pain in both knees, hyaluronic acid injections helped a couple years ago, too costly to repeat at this time per pt.  No home bp monitoring.  Morb obesity: she doesn't think she will be going forward with gastric sleeve surgery. Her f/u with surg has been interrupted due to covid crisis, but she may resume surgery f/u in the near future.  PAF: no palps or heart racing.  Iron def anemia: not taking any iron supplement at all. Has f/u with Dr. Laural [name] in 5d.  ROS: no fevers, no CP, no SOB, no wheezing, no cough, no dizziness, no HAs, no rashes, no melena/hematochezia.  No polyuria or polydipsia.  No myalgias or arthralgias.  No focal weakness, paresthesias, or tremors.  No acute vision or hearing abnormalities. No n/v/d or abd pain.  No palpitations.    Past Medical History:  Diagnosis Date  . Anxiety   . 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  Cirrhosis, nonalcoholic ([place]) 16/3846   NASH--> early cirrhotic changes on ultrasound 07/2018. ? to get liver bx if she gets bariatric surgery? Mild portal hypertensive gastropathy on EGD 08/2019.  Marland Kitchen Dietary iron deficiency without anemia 12/2018   Possibly inadequate absorption secondary to chronic/long term PPI therapy.  flintstone vitamin bid started end of april 2020.  But Hemoccult 2 of 3 positive 05/2019.  Colonoscopy ok 2020. Givens study-food in stomach with small amount of blood.  EGD 08/2019 mild portal hypertensive gastropathy.  . Fibromyalgia   . GERD   . History of thrombocytopenia 12/2011  . Hyperlipidemia    Intolerant of statins  . HYPERTENSION   . IBS (irritable bowel syndrome)    -D.  Good response to bentyl and imodium as of 06/2018 GI f/u.  Marland Kitchen IDDM (insulin dependent diabetes mellitus)    with DPN (managed by Dr. Cruzita Lederer but then in 2018 pt preferred to have me manage for her convenience)  . Limited mobility    Requires a walker for arthritic pain, widespread musculoskeletal pain, and neuropathic pain.  . Morbid obesity ([place])    As of 11/2018, pt considering sleeve gastectomy vs bipass as of eval by Dr. Lucia Gaskins: next steps are labs/xrays, psych and nutrit consults, and cardiology clearance.  . Nonalcoholic steatohepatitis    Viral Hep screens NEG.  CT 2015.  Transaminasemia.  U/S 07/2018  showed early changes of cirrhosis.  . OSA (obstructive sleep apnea) 09/14/2015   sleep study 09/07/15: severe obstructive sleep apnea with an AHI of 72 and SaO2 low of 75%.>refer to sleep med for eval and tx options  . Osteoarthritis    hips, shoulders, knees  . PAF (paroxysmal atrial fibrillation) ([place])    One documented episode (after getting EGD 2016).  Was on amiodarone x 3 mo.  Rate control with metoprolol + anticoag with xarelto.   . Small fiber neuropathy    Due to DM.  Symmetric hands and feet tingling/numbness.  Marland Kitchen Ulcerative colitis ([place])    Remicade infusion Q 8 weeks: in clinical  and endoscopic remission as of 12/2018 GI f/u.  06/11/19 rpt colonoscopy->cecal and ascending colon colitis.    Past Surgical History:  Procedure Laterality Date  . ABDOMINAL HYSTERECTOMY  1980   Paps no longer indicated.  Marland Kitchen BACTERIAL OVERGROWTH TEST N/A 07/13/2015   Procedure: BACTERIAL OVERGROWTH TEST;  Surgeon: Rogene Houston, MD;  [facility];  Service: Endoscopy;  Laterality: N/A;  730    . BILATERAL SALPINGOOPHORECTOMY  02/10/2001  . BIOPSY  06/11/2019   Procedure: BIOPSY;  Surgeon: Rogene Houston, MD;  [facility];  Service: Endoscopy;;  colon  . BIOPSY  09/27/2019   Procedure: BIOPSY;  Surgeon: Rogene Houston, MD;  [facility];  Service: Endoscopy;;  gastric duodenal  . BREAST REDUCTION SURGERY  1994   bilat  . CARDIOVASCULAR STRESS TEST  07/2010   Lexiscan myoview: normal  . CARPAL TUNNEL RELEASE Right 1996  . CARPAL TUNNEL RELEASE Left 03/21/2003  . CARPAL TUNNEL RELEASE Right 05/04/2013   Procedure: CARPAL TUNNEL RELEASE;  Surgeon: Cammie Sickle., MD;  Location: [facility];  Service: Orthopedics;  Laterality: Right;  . CARPAL TUNNEL RELEASE Left 09/21/2013   Procedure: LEFT CARPAL TUNNEL RELEASE;  Surgeon: Cammie Sickle., MD;  Location: [facility];  Service: Orthopedics;  Laterality: Left;  . CHOLECYSTECTOMY    . COLONOSCOPY WITH PROPOFOL N/A 08/04/2015   Colitis in remission.  No polyps.  Procedure: COLONOSCOPY WITH PROPOFOL;  Surgeon: Rogene Houston, MD;  [facility];  Service: Endoscopy;  Laterality: N/A;  cecum time in  0820   time out  0827    total time 7 minutes  . COLONOSCOPY WITH PROPOFOL N/A 06/11/2019   cecal and ascending colon colitis.  Procedure: COLONOSCOPY WITH PROPOFOL;  Surgeon: Rogene Houston, MD;  [facility];  Service: Endoscopy;  Laterality: N/A;  730a  . ESOPHAGEAL DILATION N/A 08/04/2015   Procedure: ESOPHAGEAL DILATION;  Surgeon: Rogene Houston, MD;   [facility];  Service: Endoscopy;  Laterality: N/A;  [place] 56, no mucousal disruption  . ESOPHAGOGASTRODUODENOSCOPY  09/27/2019   Performed for IDA.  Esoph dilation was done but no stricture present.  Mild portal hypertensive gastropathy, o/w normal.  Duodenal bx NEG.  h pylori neg.  Marland Kitchen ESOPHAGOGASTRODUODENOSCOPY (EGD) WITH ESOPHAGEAL DILATION  12/02/2005  . ESOPHAGOGASTRODUODENOSCOPY (EGD) WITH PROPOFOL N/A 08/04/2015   Procedure: ESOPHAGOGASTRODUODENOSCOPY (EGD) WITH PROPOFOL;  Surgeon: Rogene Houston, MD;  [facility];  Service: Endoscopy;  Laterality: N/A;  procedure 1  . ESOPHAGOGASTRODUODENOSCOPY (EGD) WITH PROPOFOL N/A 09/27/2019   Procedure: ESOPHAGOGASTRODUODENOSCOPY (EGD) WITH PROPOFOL;  Surgeon: Rogene Houston, MD;  [facility];  Service: Endoscopy;  Laterality: N/A;  12:10  . FLEXIBLE SIGMOIDOSCOPY  01/17/2012   Procedure: FLEXIBLE SIGMOIDOSCOPY;  Surgeon: Rogene Houston, MD;  [facility];  Service: Endoscopy;  Laterality: N/A;  . GIVENS CAPSULE STUDY N/A 08/03/2019   Procedure: GIVENS CAPSULE STUDY (performed for IDA)->some food debris in stomach and small amount of blood.  Surgeon: Rogene Houston, MD;  [facility];  Service: Endoscopy;  Laterality: N/A;  730AM  . HEMILAMINOTOMY LUMBAR SPINE Bilateral 09/07/1999   L4-5  . KNEE ARTHROSCOPY Right 01/1999; 10/2000  . LYSIS OF ADHESION  02/10/2001  . MALONEY DILATION  09/27/2019   Procedure: MALONEY DILATION;  Surgeon: Rogene Houston, MD;  [facility];  Service: Endoscopy;;  . [place]; 09/12/2006  . TARSAL TUNNEL RELEASE  2002  . TRANSTHORACIC ECHOCARDIOGRAM  08/04/2015   EF 60-65%, normal wall motion, mild LVH, mild LA dilation, grd I DD.  Marland Kitchen TUMOR EXCISION Left 03/21/2003   dorsal 1st web space (hand)  . URETEROLYSIS Right 02/10/2001    Family History  Problem Relation Age of Onset  . Diabetes Mother   . Hypertension Mother   . Heart attack Father         Mid 74's  . Heart disease Father   . Lung disease Father        spot on lung; had lung surgery  . Alcohol abuse Other   . Hypertension Son   . Diabetes Son   . Emphysema Maternal Grandfather   . Asthma Maternal Grandfather   . Colon cancer Paternal Grandfather   . Stomach cancer Paternal Grandmother   . Neuropathy Neg Hx     Social History   Socioeconomic History  . Marital status: Married    Spouse name: Not on file  . Number of children: Not on file  . Years of education: Not on file  . Highest education level: Not on file  Occupational History  . Occupation: Retired  Tobacco Use  . Smoking status: Never Smoker  . Smokeless tobacco: Never Used  Substance and Sexual Activity  . Alcohol use: Yes    Alcohol/week: 0.0 - 1.0 standard drinks  . Drug use: No  . Sexual activity: Not Currently    Partners: Male    Birth control/protection: Surgical, Abstinence    Comment: hysterectomy  Other Topics Concern  . Not on file  Social History Narrative   She lives with husband in two-story home.  They have one grown son and 2 grandchildren.   She is retired [employer].   Highest of level education:  Some college.   Never smoker.   Alcohol: rare.   Social Determinants of Health   Financial Resource Strain:   . Difficulty of Paying Living Expenses:   Food Insecurity:   . Worried About Charity fundraiser in the Last Year:   . Arboriculturist in the Last Year:   Transportation Needs:   . Film/video editor (Medical):   Marland Kitchen Lack of Transportation (Non-Medical):   Physical Activity:   . Days of Exercise per Week:   . Minutes of Exercise per Session:   Stress:   . Feeling of Stress :   Social Connections:   . Frequency of Communication with Friends and Family:   . Frequency of Social Gatherings with Friends and Family:   . Attends Religious Services:   . Active Member of Clubs or Organizations:   . Attends Archivist Meetings:   Marland Kitchen Marital Status:    Intimate Partner Violence:   . Fear of  Current or Ex-Partner:   . Emotionally Abused:   Marland Kitchen Physically Abused:   . Sexually Abused:     Outpatient Medications Prior to Visit  Medication Sig Dispense Refill  . blood glucose meter kit and supplies KIT Use up to six times daily as directed. DX. E11.9 1 each 0  . citalopram (CELEXA) 20 MG tablet TAKE 1 TABLET BY MOUTH EVERY DAY 90 tablet 0  . Cyanocobalamin (B-12) 5000 MCG CAPS Take 5,000 mcg by mouth daily.    Marland Kitchen dicyclomine (BENTYL) 10 MG capsule TAKE 1 CAPSULE (10 MG TOTAL) BY MOUTH 3 (THREE) TIMES DAILY BEFORE MEALS. (Patient taking differently: Take 10 mg by mouth 3 (three) times daily before meals. Pt takes twice daily before meals.) 270 capsule 3  . esomeprazole (NEXIUM) 20 MG capsule Take 20 mg by mouth daily at 12 noon.    . furosemide (LASIX) 40 MG tablet TAKE 1 TABLET BY MOUTH EVERY DAY 90 tablet 1  . gabapentin (NEURONTIN) 600 MG tablet 1.5 tabs po bid (Patient taking differently: Take 900 mg by mouth 2 (two) times daily. ) 270 tablet 3  . HUMULIN R U-500 KWIKPEN 500 UNIT/ML kwikpen INJECT 123U SUBCUTANEOUSLY AT BREAKFAST, 133U MID-DAY. 117U AT SUPPER. IF SUGAR IS GREATER THAN 200 AT BEDTIME INJECT AN ADDITIONAL 25U. (Patient taking differently: Inject 35-135 Units into the skin See admin instructions. Inject 125 units subcutaneously at breakfast and at lunch, 135 units at supper. If sugar is greater than 200 at bedtime inject an additional 35 units.) 60 mL 2  . inFLIXimab (REMICADE) 100 MG injection Inject 100 mg into the vein every 8 (eight) weeks.     . Insulin Pen Needle (B-D UF III MINI PEN NEEDLES) 31G X 5 MM MISC USE TO INJECT INSULINS EQUAL TO 6 TIMES DAILY. 600 each 5  . lactase (LACTAID) 3000 UNITS tablet Take 3,000 Units by mouth as needed (when eating foods containing dairy).     Marland Kitchen loperamide (IMODIUM) 2 MG capsule Take 2-4 mg by mouth as needed for diarrhea or loose stools.  30 capsule   . meclizine (ANTIVERT) 25 MG tablet  Take 1 tablet (25 mg total) by mouth 3 (three) times daily as needed for dizziness or nausea. 90 tablet 0  . metoprolol succinate (TOPROL-XL) 100 MG 24 hr tablet TAKE 1 TABLET BY MOUTH 2 TIMES DAILY. TAKE WITH OR IMMEDIATELY FOLLOWING A MEAL 60 tablet 0  . rivaroxaban (XARELTO) 20 MG TABS tablet Take 1 tablet (20 mg total) by mouth daily with supper.    . TRULICITY 1.5 QP/5.9FM SOPN INJECT UNDER SKIN 1.5 MG WEEKLY 12 pen 3  . potassium chloride (KLOR-CON M10) 10 MEQ tablet Take 1 tablet (10 mEq total) by mouth 2 (two) times daily. (Patient taking differently: Take 10 mEq by mouth once. ) 60 tablet 0  . acetaminophen (TYLENOL) 500 MG tablet Take 1,000 mg by mouth every 6 (six) hours as needed for moderate pain or headache.    . Multiple Vitamin (MULTIVITAMIN WITH MINERALS) TABS tablet Take 1 tablet by mouth daily.    . Lancets (ONETOUCH DELICA PLUS [id number]) MISC USE AS DIRECTED TO CHECK BLOOD SUGAR UP TO 6 TIMES PER DAY. DX: E11.09 (Patient not taking: Reported on 01/13/2020) 500 each 3  . ONETOUCH ULTRA test strip USE AS INSTRUCTED TO CHECK SUGAR 6 TIMES DAILY (Patient not taking: Reported on 01/13/2020) 400 strip 5   No facility-administered medications prior to visit.    Allergies  Allergen Reactions  . Omeprazole  Anaphylaxis and Swelling    SWELLING OF TONGUE AND THROAT  . Actos [Pioglitazone] Other (See Comments)    Weight gain  . Benzocaine-Menthol Swelling    SWELLING OF MOUTH  . Colesevelam Other (See Comments)    GI UPSET  . Flagyl [Metronidazole Hcl] Other (See Comments)    DIAPHORESIS  . Metformin And Related Diarrhea  . Shrimp [Shellfish Allergy] Itching    OF THROAT AND EARS  . Statins Palpitations  . Desipramine Hcl Itching, Nausea Only and Other (See Comments)    "swimmy" headed, ears itched   . Hydromorphone Itching  . Jardiance [Empagliflozin] Other (See Comments)    weakness  . Lactose Intolerance (Gi) Diarrhea    Gas, bloating  . Adhesive [Tape] Other (See  Comments)    SKIN IRRITATION AND BRUISING  . Nisoldipine Itching  . Percocet [Oxycodone-Acetaminophen] Itching     PE; Blood pressure 112/69, pulse 66, temperature 98.3 F (36.8 C), temperature source Temporal, resp. rate 16, height _0  (1.549 m), weight 231 lb 3.2 oz (104.9 kg), SpO2 98 %. Body mass index is 43.68 kg/m.  Gen: Alert, well appearing.  Patient is oriented to person, place, time, and situation. AFFECT: pleasant, lucid thought and speech. CV: RRR, no m/r/g.   LUNGS: CTA bilat, nonlabored resps, good aeration in all lung fields. ABd: soft, NT/ND EXT: no clubbing or cyanosis.  She has nonpitting "doughy" edema RLL, L LL with same + 1+ pitting edema. Fibrotic skin of both LLs.  Foot exam -  no swelling, tenderness or skin or vascular lesions. Color and temperature is normal. Sensation is intact. Peripheral pulses are palpable. Toenails are normal.   Pertinent labs:  Lab Results  Component Value Date   TSH 2.54 08/18/2019   Lab Results  Component Value Date   WBC 4.6 01/13/2020   HGB 8.3 (L) 01/13/2020   HCT 27.4 (L) 01/13/2020   MCV 82.0 01/13/2020   PLT 137 (L) 01/13/2020   Lab Results  Component Value Date   IRON 27 (L) 01/13/2020   TIBC 357 01/13/2020   FERRITIN 7 (L) 01/13/2020    Lab Results  Component Value Date   CREATININE 0.71 01/13/2020   BUN 9 01/13/2020   NA 140 01/13/2020   K 3.7 01/13/2020   CL 105 01/13/2020   CO2 27 01/13/2020   Lab Results  Component Value Date   ALT 21 01/13/2020   AST 32 01/13/2020   ALKPHOS 89 08/18/2019   BILITOT 0.5 01/13/2020   Lab Results  Component Value Date   CHOL 224 (H) 12/01/2017   Lab Results  Component Value Date   HDL 38.60 (L) 12/01/2017   Lab Results  Component Value Date   LDLCALC 156 (H) 12/01/2017   Lab Results  Component Value Date   TRIG 149.0 12/01/2017   Lab Results  Component Value Date   CHOLHDL 6 12/01/2017   Lab Results  Component Value Date   HGBA1C 7.3 (H)  01/13/2020    ASSESSMENT AND PLAN:   1) DM 2; glucoses pretty erratic. Eating habits erratic at times, precipitating some hypoglycemia b/c pt still taking her normal doses of insulin. Feet exam today showing DPN changes in plantar surfaces bilat. HbA1c today +lytes/cr. Only making one change in insulin dosing today->Decrease your "extra" dose of insulin (the dose that you give if your bedtime glucose is over 200) at bedtime from 30 U to 25 Units. Will wait on A1c to make any additional recommendations.  2) NAFLD:  has portal hypertensive gastropathy but no known varices. Dr. Laural [name] had hoped she would get liver bx to confirm suspected fibrosis/cirrhosis during the time of her bariatric surgery but now bariatric surgery seems likely not to happen anytime soon. Follow [facility] today.  3) IDA from chronic GIB: ? not real site of bleeding noted on last year's EGD/Colon/Givens. She does not tolerate oral iron. CBC w/iron panel today. She is set to see Dr. Laural [name] in about 5d.  4) HTN: The current medical regimen is effective;  continue present plan and medications. Lytes/cr today.  5) Preventative health: Vaccines: all UTD, including covid x 2.  Shingrix discussed->hold off on rx to pharmacy until I see her for next f/u visit. Cervical ca screening: no further cervical ca screening is indicated (she is s/p hysterectomy for benign dx).  Has GYN MD. Breast ca screening: next mammogram due 03/2020 (GYN MD). Osteoporosis screening: osteopenia on DEXA 03/2019.  Rpt 03/2021. Colon ca screening: most recent colonoscopy 2020->frequency of repeat colonoscopy n the context of this pt with UC and hx of GIB is to be determined by Dr. Laural [name], her GI MD.  An After Visit Summary was printed and given to the patient.  FOLLOW UP:  Return in about 3 months (around 04/13/2020) for annual CPE (fasting).  Signed:  Crissie Sickles, MD           01/16/2020

## 2020-01-13 NOTE — Patient Instructions (Signed)
Decrease your "extra" dose of insulin (the dose that you give if your bedtime glucose is over 200) at bedtime to 25 Units.

## 2020-01-14 ENCOUNTER — Telehealth: Payer: Self-pay

## 2020-01-14 LAB — CBC
HCT: 27.4 % — ABNORMAL LOW (ref 35.0–45.0)
Hemoglobin: 8.3 g/dL — ABNORMAL LOW (ref 11.7–15.5)
MCH: 24.9 pg — ABNORMAL LOW (ref 27.0–33.0)
MCHC: 30.3 g/dL — ABNORMAL LOW (ref 32.0–36.0)
MCV: 82 fL (ref 80.0–100.0)
MPV: 11.4 fL (ref 7.5–12.5)
Platelets: 137 10*3/uL — ABNORMAL LOW (ref 140–400)
RBC: 3.34 10*6/uL — ABNORMAL LOW (ref 3.80–5.10)
RDW: 15.3 % — ABNORMAL HIGH (ref 11.0–15.0)
WBC: 4.6 10*3/uL (ref 3.8–10.8)

## 2020-01-14 LAB — IRON,TIBC AND FERRITIN PANEL
%SAT: 8 % (calc) — ABNORMAL LOW (ref 16–45)
Ferritin: 7 ng/mL — ABNORMAL LOW (ref 16–288)
Iron: 27 ug/dL — ABNORMAL LOW (ref 45–160)
TIBC: 357 mcg/dL (calc) (ref 250–450)

## 2020-01-14 LAB — COMPREHENSIVE METABOLIC PANEL
AG Ratio: 1 (calc) (ref 1.0–2.5)
ALT: 21 U/L (ref 6–29)
AST: 32 U/L (ref 10–35)
Albumin: 3.4 g/dL — ABNORMAL LOW (ref 3.6–5.1)
Alkaline phosphatase (APISO): 69 U/L (ref 37–153)
BUN: 9 mg/dL (ref 7–25)
CO2: 27 mmol/L (ref 20–32)
Calcium: 8.8 mg/dL (ref 8.6–10.4)
Chloride: 105 mmol/L (ref 98–110)
Creat: 0.71 mg/dL (ref 0.60–0.93)
Globulin: 3.4 g/dL (calc) (ref 1.9–3.7)
Glucose, Bld: 93 mg/dL (ref 65–99)
Potassium: 3.7 mmol/L (ref 3.5–5.3)
Sodium: 140 mmol/L (ref 135–146)
Total Bilirubin: 0.5 mg/dL (ref 0.2–1.2)
Total Protein: 6.8 g/dL (ref 6.1–8.1)

## 2020-01-14 LAB — HEMOGLOBIN A1C
Hgb A1c MFr Bld: 7.3 % of total Hgb — ABNORMAL HIGH (ref <5.7)
Mean Plasma Glucose: 163 (calc)
eAG (mmol/L): 9 (calc)

## 2020-01-14 NOTE — Telephone Encounter (Signed)
Pharmacy called in needing clarification on Rx sent in on 01/13/2020  potassium chloride (KLOR-CON M10) 10 MEQ tablet (Expired)   States that, they did not know how to fill the medication. It was written as 1 tablet by mouth once for 1 dose dispense 90 pills.    Just wanting to make sure the verbal is correct and the amount of medication the patient is to have    Please call and advise

## 2020-01-14 NOTE — Telephone Encounter (Signed)
Please advise how patient is to take Klor-Con 22mq.

## 2020-01-15 NOTE — Telephone Encounter (Signed)
1 tab every day

## 2020-01-16 ENCOUNTER — Encounter: Payer: Self-pay | Admitting: Family Medicine

## 2020-01-17 ENCOUNTER — Ambulatory Visit (INDEPENDENT_AMBULATORY_CARE_PROVIDER_SITE_OTHER): Payer: Medicare PPO | Admitting: Internal Medicine

## 2020-01-17 ENCOUNTER — Other Ambulatory Visit: Payer: Self-pay

## 2020-01-17 ENCOUNTER — Encounter (INDEPENDENT_AMBULATORY_CARE_PROVIDER_SITE_OTHER): Payer: Self-pay | Admitting: Internal Medicine

## 2020-01-17 ENCOUNTER — Other Ambulatory Visit (INDEPENDENT_AMBULATORY_CARE_PROVIDER_SITE_OTHER): Payer: Self-pay | Admitting: *Deleted

## 2020-01-17 VITALS — BP 110/64 | HR 80 | Temp 97.0°F | Ht 61.0 in | Wt 230.5 lb

## 2020-01-17 DIAGNOSIS — K760 Fatty (change of) liver, not elsewhere classified: Secondary | ICD-10-CM | POA: Diagnosis not present

## 2020-01-17 DIAGNOSIS — K5100 Ulcerative (chronic) pancolitis without complications: Secondary | ICD-10-CM | POA: Diagnosis not present

## 2020-01-17 DIAGNOSIS — D509 Iron deficiency anemia, unspecified: Secondary | ICD-10-CM | POA: Diagnosis not present

## 2020-01-17 MED ORDER — FERROUS SULFATE 325 (65 FE) MG PO TABS: 325.0000 mg | ORAL_TABLET | Freq: Two times a day (BID) | ORAL | Status: DC

## 2020-01-17 NOTE — Telephone Encounter (Signed)
[facility] pharmacy tech, Octavia Bruckner was advised.

## 2020-01-17 NOTE — Patient Instructions (Signed)
H&H to be done on January 26, 2020

## 2020-01-17 NOTE — Progress Notes (Signed)
Presenting complaint;  Follow for chronic ulcerative colitis iron deficiency anemia fatty liver and GERD.  Database and subjective:  Patient is 74 year old Caucasian female with chronic ulcerative colitis maintained on infliximab as well as history of GERD and elevated transaminases due to fatty liver was evaluated last year for iron deficiency anemia.  She is on Xarelto for history of atrial fibrillation but never experienced melena or rectal bleeding. She had colonoscopy on 06/11/2019 revealing very mild disease in cecum and ascending colon. She had given capsule study on 08/03/2019 which revealed small amount of blood in the stomach with some food debris. She returned for esophagogastroduodenoscopy.  She had 2 small gastric polyps.  Biopsy revealed reactive changes and duodenal biopsy was normal.  Patient responded to iron infusion.  Oral iron was not resumed after endoscopic evaluation. She had blood work by Dr. Julien Nordmann 4 days ago.  Her H&H was 8.3 and 27.4 and MCV was 82.  Platelet count was 137K.  Patient states she feels about the same.  Maybe at times she feels heavy in her legs and give out.  She has not had any falling episodes.  She denies lightheadedness melena rectal bleeding hematuria or vaginal bleeding.  Her appetite is very good but she is controlling food intake.  She denies abdominal pain.  On most days she has 2 formed stools.  She may have diarrhea once a week or once every 2 weeks.  Diarrhea is usually self-limiting.  She says heartburn is well controlled with therapy.  She was being evaluated for bariatric surgery but everything has been put on hold because of Covid. She denies chest pain or exertional dyspnea.   Current Medications: Outpatient Encounter Medications as of 01/17/2020  Medication Sig  . acetaminophen (TYLENOL) 500 MG tablet Take 1,000 mg by mouth every 6 (six) hours as needed for moderate pain or headache.  . blood glucose meter kit and supplies KIT Use  up to six times daily as directed. DX. E11.9  . citalopram (CELEXA) 20 MG tablet TAKE 1 TABLET BY MOUTH EVERY DAY  . Cyanocobalamin (B-12) 5000 MCG CAPS Take 5,000 mcg by mouth daily.  Marland Kitchen dicyclomine (BENTYL) 10 MG capsule TAKE 1 CAPSULE (10 MG TOTAL) BY MOUTH 3 (THREE) TIMES DAILY BEFORE MEALS. (Patient taking differently: Take 10 mg by mouth 3 (three) times daily before meals. Pt takes twice daily before meals.)  . esomeprazole (NEXIUM) 20 MG capsule Take 20 mg by mouth daily at 12 noon.  . furosemide (LASIX) 40 MG tablet TAKE 1 TABLET BY MOUTH EVERY DAY  . gabapentin (NEURONTIN) 600 MG tablet 1.5 tabs po bid (Patient taking differently: Take 900 mg by mouth 2 (two) times daily. )  . HUMULIN R U-500 KWIKPEN 500 UNIT/ML kwikpen INJECT 123U SUBCUTANEOUSLY AT BREAKFAST, 133U MID-DAY. 117U AT SUPPER. IF SUGAR IS GREATER THAN 200 AT BEDTIME INJECT AN ADDITIONAL 25U. (Patient taking differently: Inject 35-135 Units into the skin See admin instructions. Inject 125 units subcutaneously at breakfast and at lunch, 135 units at supper. If sugar is greater than 200 at bedtime inject an additional 35 units.)  . inFLIXimab (REMICADE) 100 MG injection Inject 100 mg into the vein every 8 (eight) weeks.   . Insulin Pen Needle (B-D UF III MINI PEN NEEDLES) 31G X 5 MM MISC USE TO INJECT INSULINS EQUAL TO 6 TIMES DAILY.  Marland Kitchen lactase (LACTAID) 3000 UNITS tablet Take 3,000 Units by mouth as needed (when eating foods containing dairy).   Marland Kitchen loperamide (IMODIUM) 2  MG capsule Take 2-4 mg by mouth as needed for diarrhea or loose stools.   . meclizine (ANTIVERT) 25 MG tablet Take 1 tablet (25 mg total) by mouth 3 (three) times daily as needed for dizziness or nausea.  . metoprolol succinate (TOPROL-XL) 100 MG 24 hr tablet TAKE 1 TABLET BY MOUTH 2 TIMES DAILY. TAKE WITH OR IMMEDIATELY FOLLOWING A MEAL  . Multiple Vitamin (MULTIVITAMIN WITH MINERALS) TABS tablet Take 1 tablet by mouth daily.  . potassium chloride (KLOR-CON M10) 10  MEQ tablet Take 1 tablet (10 mEq total) by mouth once for 1 dose.  . rivaroxaban (XARELTO) 20 MG TABS tablet Take 1 tablet (20 mg total) by mouth daily with supper.  . TRULICITY 1.5 WU/8.8BV SOPN INJECT UNDER SKIN 1.5 MG WEEKLY  . [DISCONTINUED] potassium chloride (K-DUR) 10 MEQ tablet Take 2 tablets (20 mEq total) by mouth daily.  . [DISCONTINUED] amitriptyline (ELAVIL) 25 MG tablet Take 1 tablet (25 mg total) by mouth at bedtime. start with 1/2 tab at night for 1 week then increase to 1 whole tab.  . [DISCONTINUED] bromocriptine (PARLODEL) 2.5 MG tablet Take 2.5 mg by mouth 2 (two) times daily.     No facility-administered encounter medications on file as of 01/17/2020.     Objective: Blood pressure 110/64, pulse 80, temperature (!) 97 F (36.1 C), temperature source Temporal, height _0  (1.549 m), weight 230 lb 8 oz (104.6 kg). Patient is alert and in no acute distress. Patient is wearing a facial mask. Conjunctiva is pink. Sclera is nonicteric Oropharyngeal mucosa is normal. No neck masses or thyromegaly noted. Cardiac exam with regular rhythm normal S1 and S2. No murmur or gallop noted. Lungs are clear to auscultation. Abdomen is full but soft and nontender with organomegaly or masses. No LE edema or clubbing noted.  Labs/studies Results:  CBC Latest Ref Rng & Units 01/13/2020 08/18/2019 06/11/2019  WBC 3.8 - 10.8 Thousand/uL 4.6 5.3 -  Hemoglobin 11.7 - 15.5 g/dL 8.3(L) 12.2 11.2(L)  Hematocrit 35.0 - 45.0 % 27.4(L) 36.7 36.5  Platelets 140 - 400 Thousand/uL 137(L) 134.0(L) -    CMP Latest Ref Rng & Units 01/13/2020 08/18/2019 04/27/2019  Glucose 65 - 99 mg/dL 93 99 177(H)  BUN 7 - 25 mg/dL _1 Creatinine 0.60 - 0.93 mg/dL 0.71 0.66 0.72  Sodium 135 - 146 mmol/L 140 141 139  Potassium 3.5 - 5.3 mmol/L 3.7 3.8 3.9  Chloride 98 - 110 mmol/L 105 103 103  CO2 20 - 32 mmol/L _2 Calcium 8.6 - 10.4 mg/dL 8.8 8.6 8.7  Total Protein 6.1 - 8.1 g/dL 6.8 7.4 6.9  Total  Bilirubin 0.2 - 1.2 mg/dL 0.5 0.7 0.7  Alkaline Phos 39 - 117 U/L - 89 79  AST 10 - 35 U/L 32 71(H) 46(H)  ALT 6 - 29 U/L 21 54(H) 38(H)    Hepatic Function Latest Ref Rng & Units 01/13/2020 08/18/2019 04/27/2019  Total Protein 6.1 - 8.1 g/dL 6.8 7.4 6.9  Albumin 3.5 - 5.2 g/dL - 3.6 3.5  AST 10 - 35 U/L 32 71(H) 46(H)  ALT 6 - 29 U/L 21 54(H) 38(H)  Alk Phosphatase 39 - 117 U/L - 89 79  Total Bilirubin 0.2 - 1.2 mg/dL 0.5 0.7 0.7  Bilirubin, Direct 0.0 - 0.3 mg/dL - - -     Assessment:  #1.  Iron deficiency anemia.  I suspect she has impaired iron absorption.  She is on chronic acid suppression.  She is on anticoagulant and if she was to bleed I would expect overt GI bleed.  She did respond to parenteral iron last year.  If she does not respond to oral iron will begin iron infusion.  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  #3.  Gastroesophageal reflux disease.  She is doing well with dietary measures and low-dose PPI.  #4.  Elevated transaminases felt to be due to fatty liver.  Transaminases are now normal which is encouraging but surprising.  I wonder if normalization is because of optimal glycemic control as she has not lost any weight and she is not able to do much physical activity. She does have mild thrombocytopenia and ultrasound in November 2019 raised the possibility of cirrhosis.  If she is going to proceed with bariatric surgery she could have liver biopsy at the time of surgery and if not will consider ultrasound elastography.   Plan:  Ferrous sulfate 325 mg by mouth with breakfast and evening meal daily. Patient will go to the lab for H&H on 01/26/2020 Patient will call office should she experience melena or rectal bleeding. Office visit in 6 months.

## 2020-01-18 ENCOUNTER — Encounter: Payer: Self-pay | Admitting: Family Medicine

## 2020-01-25 ENCOUNTER — Ambulatory Visit (INDEPENDENT_AMBULATORY_CARE_PROVIDER_SITE_OTHER): Payer: Medicare Other | Admitting: Internal Medicine

## 2020-01-25 DIAGNOSIS — D509 Iron deficiency anemia, unspecified: Secondary | ICD-10-CM | POA: Diagnosis not present

## 2020-01-25 LAB — HEMOGLOBIN AND HEMATOCRIT, BLOOD
HCT: 31.1 % — ABNORMAL LOW (ref 35.0–45.0)
Hemoglobin: 9.8 g/dL — ABNORMAL LOW (ref 11.7–15.5)

## 2020-01-26 ENCOUNTER — Other Ambulatory Visit: Payer: Self-pay

## 2020-01-26 MED ORDER — METOPROLOL SUCCINATE ER 100 MG PO TB24: ORAL_TABLET | ORAL | 0 refills | Status: DC

## 2020-01-28 ENCOUNTER — Other Ambulatory Visit (INDEPENDENT_AMBULATORY_CARE_PROVIDER_SITE_OTHER): Payer: Self-pay | Admitting: *Deleted

## 2020-01-28 DIAGNOSIS — K5100 Ulcerative (chronic) pancolitis without complications: Secondary | ICD-10-CM

## 2020-01-28 DIAGNOSIS — D649 Anemia, unspecified: Secondary | ICD-10-CM

## 2020-01-28 DIAGNOSIS — D509 Iron deficiency anemia, unspecified: Secondary | ICD-10-CM

## 2020-02-05 ENCOUNTER — Other Ambulatory Visit: Payer: Self-pay | Admitting: Family Medicine

## 2020-02-29 DIAGNOSIS — D509 Iron deficiency anemia, unspecified: Secondary | ICD-10-CM | POA: Diagnosis not present

## 2020-02-29 DIAGNOSIS — K5180 Other ulcerative colitis without complications: Secondary | ICD-10-CM | POA: Diagnosis not present

## 2020-02-29 DIAGNOSIS — D649 Anemia, unspecified: Secondary | ICD-10-CM | POA: Diagnosis not present

## 2020-03-07 ENCOUNTER — Other Ambulatory Visit: Payer: Self-pay | Admitting: Cardiology

## 2020-03-07 NOTE — Telephone Encounter (Signed)
Rx request sent to pharmacy.  

## 2020-03-08 ENCOUNTER — Other Ambulatory Visit: Payer: Self-pay

## 2020-03-08 ENCOUNTER — Other Ambulatory Visit: Payer: Self-pay | Admitting: Family Medicine

## 2020-03-31 DIAGNOSIS — G4733 Obstructive sleep apnea (adult) (pediatric): Secondary | ICD-10-CM | POA: Diagnosis not present

## 2020-04-06 ENCOUNTER — Other Ambulatory Visit: Payer: Self-pay | Admitting: Family Medicine

## 2020-04-07 ENCOUNTER — Other Ambulatory Visit: Payer: Self-pay | Admitting: Family Medicine

## 2020-04-07 NOTE — Telephone Encounter (Signed)
RF request from pharmacy for [place].  I don't see this in her current med list.  Patient has O/V w/ you 04/18/20.   Please advise if rf is appropriate?

## 2020-04-10 ENCOUNTER — Other Ambulatory Visit: Payer: Self-pay | Admitting: Family Medicine

## 2020-04-18 ENCOUNTER — Encounter: Payer: Self-pay | Admitting: Family Medicine

## 2020-04-18 ENCOUNTER — Ambulatory Visit: Payer: Medicare PPO | Admitting: Family Medicine

## 2020-04-18 ENCOUNTER — Other Ambulatory Visit: Payer: Self-pay

## 2020-04-18 VITALS — BP 96/58 | HR 70 | Temp 98.6°F | Resp 16 | Ht 61.0 in | Wt 234.2 lb

## 2020-04-18 DIAGNOSIS — E7800 Pure hypercholesterolemia, unspecified: Secondary | ICD-10-CM | POA: Diagnosis not present

## 2020-04-18 DIAGNOSIS — I10 Essential (primary) hypertension: Secondary | ICD-10-CM

## 2020-04-18 DIAGNOSIS — E1142 Type 2 diabetes mellitus with diabetic polyneuropathy: Secondary | ICD-10-CM

## 2020-04-18 DIAGNOSIS — Z7901 Long term (current) use of anticoagulants: Secondary | ICD-10-CM | POA: Diagnosis not present

## 2020-04-18 DIAGNOSIS — K7581 Nonalcoholic steatohepatitis (NASH): Secondary | ICD-10-CM

## 2020-04-18 DIAGNOSIS — D508 Other iron deficiency anemias: Secondary | ICD-10-CM

## 2020-04-18 DIAGNOSIS — I480 Paroxysmal atrial fibrillation: Secondary | ICD-10-CM

## 2020-04-18 DIAGNOSIS — R040 Epistaxis: Secondary | ICD-10-CM

## 2020-04-18 LAB — CBC WITH DIFFERENTIAL/PLATELET
Basophils Absolute: 0 10*3/uL (ref 0.0–0.1)
Basophils Relative: 0.9 % (ref 0.0–3.0)
Eosinophils Absolute: 0.2 10*3/uL (ref 0.0–0.7)
Eosinophils Relative: 3.8 % (ref 0.0–5.0)
HCT: 38.7 % (ref 36.0–46.0)
Hemoglobin: 13.4 g/dL (ref 12.0–15.0)
Lymphocytes Relative: 45.3 % (ref 12.0–46.0)
Lymphs Abs: 2.5 10*3/uL (ref 0.7–4.0)
MCHC: 34.6 g/dL (ref 30.0–36.0)
MCV: 96.5 fl (ref 78.0–100.0)
Monocytes Absolute: 0.6 10*3/uL (ref 0.1–1.0)
Monocytes Relative: 10.7 % (ref 3.0–12.0)
Neutro Abs: 2.1 10*3/uL (ref 1.4–7.7)
Neutrophils Relative %: 39.3 % — ABNORMAL LOW (ref 43.0–77.0)
Platelets: 108 10*3/uL — ABNORMAL LOW (ref 150.0–400.0)
RBC: 4.01 Mil/uL (ref 3.87–5.11)
RDW: 14.5 % (ref 11.5–15.5)
WBC: 5.4 10*3/uL (ref 4.0–10.5)

## 2020-04-18 LAB — LIPID PANEL
Cholesterol: 202 mg/dL — ABNORMAL HIGH (ref 0–200)
HDL: 41.5 mg/dL (ref >39.00)
LDL Cholesterol: 139 mg/dL — ABNORMAL HIGH (ref 0–99)
NonHDL: 160.54
Total CHOL/HDL Ratio: 5
Triglycerides: 108 mg/dL (ref 0.0–149.0)
VLDL: 21.6 mg/dL (ref 0.0–40.0)

## 2020-04-18 LAB — COMPREHENSIVE METABOLIC PANEL
ALT: 44 U/L — ABNORMAL HIGH (ref 0–35)
AST: 54 U/L — ABNORMAL HIGH (ref 0–37)
Albumin: 3.5 g/dL (ref 3.5–5.2)
Alkaline Phosphatase: 67 U/L (ref 39–117)
BUN: 7 mg/dL (ref 6–23)
CO2: 32 mEq/L (ref 19–32)
Calcium: 8.8 mg/dL (ref 8.4–10.5)
Chloride: 103 mEq/L (ref 96–112)
Creatinine, Ser: 0.59 mg/dL (ref 0.40–1.20)
GFR: 100.01 mL/min (ref >60.00)
Glucose, Bld: 76 mg/dL (ref 70–99)
Potassium: 3.6 mEq/L (ref 3.5–5.1)
Sodium: 141 mEq/L (ref 135–145)
Total Bilirubin: 0.7 mg/dL (ref 0.2–1.2)
Total Protein: 6.9 g/dL (ref 6.0–8.3)

## 2020-04-18 LAB — HEMOGLOBIN A1C: Hgb A1c MFr Bld: 6.8 % — ABNORMAL HIGH (ref 4.6–6.5)

## 2020-04-18 LAB — GLUCOSE, POCT (MANUAL RESULT ENTRY): POC Glucose: 117 mg/dl — AB (ref 70–99)

## 2020-04-18 MED ORDER — HUMULIN R U-500 KWIKPEN 500 UNIT/ML ~~LOC~~ SOPN: PEN_INJECTOR | SUBCUTANEOUS | 2 refills | Status: DC

## 2020-04-18 NOTE — Patient Instructions (Signed)
Do not take your metoprolol today. Check bp and heart rate 2-3 times per day over the next 1 wk and write these numbers down. If bp gets back above 130 on top then restart metoprolol ONE TAB ONCE A DAY. If bp remains over 130 on top after starting back on metoprolol then increase this med to 1 tab twice a day.  Insulin: Decrease BF dose to 123 Units, decrease lunch dose to 128, and decrease supper dose to 123.   If glucose is more than 200 at bedtime, take only 20 units.

## 2020-04-18 NOTE — Progress Notes (Signed)
OFFICE VISIT  04/18/2020   CC:  Chief Complaint  Patient presents with  . Follow-up    RCI, pt is fasting   HPI:    Patient is a 74 y.o. Caucasian female who presents for 3 mo f/u DM 2, HTN, morb obesity, NAFLD, iron def anemia from chronic GI blood loss. A/P as of last visit: "1) DM 2; glucoses pretty erratic. Eating habits erratic at times, precipitating some hypoglycemia b/c pt still taking her normal doses of insulin. Feet exam today showing DPN changes in plantar surfaces bilat. HbA1c today +lytes/cr. Only making one change in insulin dosing today->Decrease your "extra" dose of insulin (the dose that you give if your bedtime glucose is over 200) at bedtime from 30 U to 25 Units. Will wait on A1c to make any additional recommendations.  2) NAFLD: has portal hypertensive gastropathy but no known varices. Dr. Laural [name] had hoped she would get liver bx to confirm suspected fibrosis/cirrhosis during the time of her bariatric surgery but now bariatric surgery seems likely not to happen anytime soon. Follow CMET today.  3) IDA from chronic GIB: ? not real site of bleeding noted on last year's EGD/Colon/[place]. She does not tolerate oral iron. CBC w/iron panel today. She is set to see Dr. Laural [name] in about 5d.  4) HTN: The current medical regimen is effective;  continue present plan and medications. Lytes/cr today.  5) Preventative health: Vaccines: all UTD, including covid x 2.  Shingrix discussed->hold off on rx to pharmacy until I see her for next f/u visit. Cervical ca screening: no further cervical ca screening is indicated (she is s/p hysterectomy for benign dx).  Has GYN MD. Breast ca screening: next mammogram due 03/2020 (GYN MD). Osteoporosis screening: osteopenia on DEXA 03/2019.  Rpt 03/2021. Colon ca screening: most recent colonoscopy 2020->frequency of repeat colonoscopy n the context of this pt with UC and hx of GIB is to be determined by Dr. Laural [name], her GI  MD."  INTERIM HX: Feeling well up until this morning, gluc felt low this morning, got shaky in shower, some cognitive "dullness", gluc at that time 92.  No insulin at BF today. She did take her other meds, including her am dose of toprol xl. BP low here today and she feels a little "outside of myself" still, states bp's at home have not been low: usually 130-150 syst, 70s diast, she does not check HRs. Fluid intake has consistently been good.   IDA: Last visit her Hb had dropped from 12.2 to 8.3 over the prior 5 months. Iron levels very low. GI w/u has been unrevealing of any GIB; poor iron absorption suspected.  Dr. Laural [name] started her on oral iron 3 mo ago, pt has not had any repeat cbc/iron since that time. Since starting iron she has noted black stool, says it is mushy/not formed but says this consistency is how her stools have always been. No red blood in stool. She has been having some nosebleeds: L nostril always, same spot each time, usually starts when blows her nose in morning.  Sometimes she wakes up and it has been going down back of throat. Bleeds occur for several days in a row, then sometimes goes a couple days w/out them. Says she never had this problem until she got on xarelto.  Says she saw ENT about it but was told cauterization not needed; pt says she feels like Dr Redmond Baseman sort of blew her off, but admits-"maybe he just had a bad day".  DM: U-500 dosing: 125-130-125, extra dose if >200 hs "30 Units" per her report today, and she doesn't recall for certain any of the recommendations I made at the time of last visit to decrease this hs dose to 25 U. Review of glucoses once again shows them to be quite erratic, some unpredictable lows and highs, then some consistently normal fasting and PP and hs.  She does say that activity level variations and dietary indiscretion does play a role consistently in the variance of her glucoses.   Past Medical History:  Diagnosis Date  .  Anxiety   . 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 ? to get liver bx if she gets bariatric surgery? Mild portal hypertensive gastropathy on EGD 08/2019.  Marland Kitchen Fibromyalgia   . GERD   . History of thrombocytopenia 12/2011  . Hyperlipidemia    Intolerant of statins  . HYPERTENSION   . IBS (irritable bowel syndrome)    -D.  Good response to bentyl and imodium as of 06/2018 GI f/u.  . IDA (iron deficiency anemia) 12/2018   Possibly inadequate absorption secondary to chronic/long term PPI therapy. No overt GI bleding. Endoscopic w/u including givens unrevealing. Dr. Laural [name] suspects poor iron absorption.  Oral iron restarted 12/2019.    Marland Kitchen IDDM (insulin dependent diabetes mellitus)    with DPN (managed by Dr. Cruzita Lederer but then in 2018 pt preferred to have me manage for her convenience)  . Limited mobility    Requires a walker for arthritic pain, widespread musculoskeletal pain, and neuropathic pain.  . Morbid obesity ([place])    As of 11/2018, pt considering sleeve gastectomy vs bipass as of eval by Dr. De Burrs considering as of 12/2019.  Marland Kitchen Nonalcoholic steatohepatitis    Viral Hep screens NEG.  CT 2015.  Transaminasemia.  U/S 07/2018 showed early changes of cirrhosis.  . OSA (obstructive sleep apnea) 09/14/2015   sleep study 09/07/15: severe obstructive sleep apnea with an AHI of 72 and SaO2 low of 75%.>refer to sleep med for eval and tx options  . Osteoarthritis    hips, shoulders, knees  . PAF (paroxysmal atrial fibrillation) ([place])    One documented episode (after getting EGD 2016).  Was on amiodarone x 3 mo.  Rate control with metoprolol + anticoag with xarelto.   . Small fiber neuropathy    Due to DM.  Symmetric hands and feet tingling/numbness.  Marland Kitchen Ulcerative colitis ([place])    Remicade infusion Q 8 weeks: in clinical and endoscopic remission as of 12/2018 GI f/u.  06/11/19 rpt  colonoscopy->cecal and ascending colon colitis.    Past Surgical History:  Procedure Laterality Date  . ABDOMINAL HYSTERECTOMY  1980   Paps no longer indicated.  Marland Kitchen BACTERIAL OVERGROWTH TEST N/A 07/13/2015   Procedure: BACTERIAL OVERGROWTH TEST;  Surgeon: Rogene Houston, MD;  [facility];  Service: Endoscopy;  Laterality: N/A;  730    . BILATERAL SALPINGOOPHORECTOMY  02/10/2001  . BIOPSY  06/11/2019   Procedure: BIOPSY;  Surgeon: Rogene Houston, MD;  [facility];  Service: Endoscopy;;  colon  . BIOPSY  09/27/2019   Procedure: BIOPSY;  Surgeon: Rogene Houston, MD;  [facility];  Service: Endoscopy;;  gastric duodenal  . BREAST REDUCTION SURGERY  1994   bilat  . CARDIOVASCULAR STRESS TEST  07/2010   Lexiscan myoview: normal  . CARPAL TUNNEL RELEASE Right  [place] Left 03/21/2003  . CARPAL TUNNEL RELEASE Right 05/04/2013   Procedure: CARPAL TUNNEL RELEASE;  Surgeon: Cammie Sickle., MD;  Location: [facility];  Service: Orthopedics;  Laterality: Right;  . CARPAL TUNNEL RELEASE Left 09/21/2013   Procedure: LEFT CARPAL TUNNEL RELEASE;  Surgeon: Cammie Sickle., MD;  Location: [facility];  Service: Orthopedics;  Laterality: Left;  . CHOLECYSTECTOMY    . COLONOSCOPY WITH PROPOFOL N/A 08/04/2015   Colitis in remission.  No polyps.  Procedure: COLONOSCOPY WITH PROPOFOL;  Surgeon: Rogene Houston, MD;  [facility];  Service: Endoscopy;  Laterality: N/A;  cecum time in  0820   time out  0827    total time 7 minutes  . COLONOSCOPY WITH PROPOFOL N/A 06/11/2019   cecal and ascending colon colitis.  Procedure: COLONOSCOPY WITH PROPOFOL;  Surgeon: Rogene Houston, MD;  [facility];  Service: Endoscopy;  Laterality: N/A;  730a  . ESOPHAGEAL DILATION N/A 08/04/2015   Procedure: ESOPHAGEAL DILATION;  Surgeon: Rogene Houston, MD;  [facility];  Service: Endoscopy;  Laterality: N/A;  [place]  57, no mucousal disruption  . ESOPHAGOGASTRODUODENOSCOPY  09/27/2019   Performed for IDA.  Esoph dilation was done but no stricture present.  Mild portal hypertensive gastropathy, o/w normal.  Duodenal bx NEG.  h pylori neg.  Marland Kitchen ESOPHAGOGASTRODUODENOSCOPY (EGD) WITH ESOPHAGEAL DILATION  12/02/2005  . ESOPHAGOGASTRODUODENOSCOPY (EGD) WITH PROPOFOL N/A 08/04/2015   Procedure: ESOPHAGOGASTRODUODENOSCOPY (EGD) WITH PROPOFOL;  Surgeon: Rogene Houston, MD;  [facility];  Service: Endoscopy;  Laterality: N/A;  procedure 1  . ESOPHAGOGASTRODUODENOSCOPY (EGD) WITH PROPOFOL N/A 09/27/2019   Procedure: ESOPHAGOGASTRODUODENOSCOPY (EGD) WITH PROPOFOL;  Surgeon: Rogene Houston, MD;  [facility];  Service: Endoscopy;  Laterality: N/A;  12:10  . FLEXIBLE SIGMOIDOSCOPY  01/17/2012   Procedure: FLEXIBLE SIGMOIDOSCOPY;  Surgeon: Rogene Houston, MD;  [facility];  Service: Endoscopy;  Laterality: N/A;  . GIVENS CAPSULE STUDY N/A 08/03/2019   Procedure: GIVENS CAPSULE STUDY (performed for IDA)->some food debris in stomach and small amount of blood.  Surgeon: Rogene Houston, MD;  [facility];  Service: Endoscopy;  Laterality: N/A;  730AM  . HEMILAMINOTOMY LUMBAR SPINE Bilateral 09/07/1999   L4-5  . KNEE ARTHROSCOPY Right 01/1999; 10/2000  . LYSIS OF ADHESION  02/10/2001  . MALONEY DILATION  09/27/2019   Procedure: MALONEY DILATION;  Surgeon: Rogene Houston, MD;  [facility];  Service: Endoscopy;;  . [place]; 09/12/2006  . TARSAL TUNNEL RELEASE  2002  . TRANSTHORACIC ECHOCARDIOGRAM  08/04/2015   EF 60-65%, normal wall motion, mild LVH, mild LA dilation, grd I DD.  Marland Kitchen TUMOR EXCISION Left 03/21/2003   dorsal 1st web space (hand)  . URETEROLYSIS Right 02/10/2001    Outpatient Medications Prior to Visit  Medication Sig Dispense Refill  . ACCU-CHEK GUIDE test strip USE TO CHECK BLOOD GLUCOSE UP TO 6 TIMES DAILY AS DIRECTED 200 strip 1  . ACCU-CHEK  GUIDE test strip     . Accu-Chek Softclix Lancets lancets USE TO CHECK BLOOD GLUCOSE UP TO 6 TIMES DAILY AS DIRECTED 200 each 1  . blood glucose meter kit and supplies KIT Use up to six times daily as directed. DX. E11.9 1 each 0  . citalopram (CELEXA) 20 MG tablet TAKE 1 TABLET BY MOUTH EVERY DAY 90 tablet 0  . Cyanocobalamin (B-12) 5000 MCG  CAPS Take 5,000 mcg by mouth daily.    Marland Kitchen dicyclomine (BENTYL) 10 MG capsule TAKE 1 CAPSULE (10 MG TOTAL) BY MOUTH 3 (THREE) TIMES DAILY BEFORE MEALS. (Patient taking differently: Take 10 mg by mouth 3 (three) times daily before meals. Pt takes twice daily before meals.) 270 capsule 3  . esomeprazole (NEXIUM) 20 MG capsule Take 20 mg by mouth daily at 12 noon.    . ferrous sulfate 325 (65 FE) MG tablet Take 1 tablet (325 mg total) by mouth 2 (two) times daily with a meal.    . furosemide (LASIX) 40 MG tablet TAKE 1 TABLET BY MOUTH EVERY DAY 90 tablet 1  . gabapentin (NEURONTIN) 600 MG tablet 1.5 tabs po bid (Patient taking differently: Take 900 mg by mouth 2 (two) times daily. ) 270 tablet 3  . inFLIXimab (REMICADE) 100 MG injection Inject 100 mg into the vein every 8 (eight) weeks.     . Insulin Pen Needle (B-D UF III MINI PEN NEEDLES) 31G X 5 MM MISC USE TO INJECT INSULINS EQUAL TO 6 TIMES DAILY. 600 each 5  . KLOR-CON M10 10 MEQ tablet TAKE 1 TABLET BY MOUTH EVERY DAY 90 tablet 1  . lactase (LACTAID) 3000 UNITS tablet Take 3,000 Units by mouth as needed (when eating foods containing dairy).     Marland Kitchen loperamide (IMODIUM) 2 MG capsule Take 2-4 mg by mouth as needed for diarrhea or loose stools.  30 capsule   . meclizine (ANTIVERT) 25 MG tablet Take 1 tablet (25 mg total) by mouth 3 (three) times daily as needed for dizziness or nausea. 90 tablet 0  . metoprolol succinate (TOPROL-XL) 100 MG 24 hr tablet TAKE 1 TABLET BY MOUTH 2 TIMES DAILY. TAKE WITH OR IMMEDIATELY FOLLOWING A MEAL 180 tablet 0  . Multiple Vitamin (MULTIVITAMIN WITH MINERALS) TABS tablet Take 1  tablet by mouth daily.    . TRULICITY 1.5 HL/4.5GY SOPN INJECT UNDER SKIN 1.5 MG WEEKLY 12 pen 3  . XARELTO 20 MG TABS tablet TAKE 1 TABLET (20 MG TOTAL) BY MOUTH DAILY WITH SUPPER. 90 tablet 1  . HUMULIN R U-500 KWIKPEN 500 UNIT/ML kwikpen INJECT 123U SUBCUTANEOUSLY AT BREAKFAST, 133U MID-DAY. 117U AT SUPPER. IF SUGAR IS GREATER THAN 200 AT BEDTIME INJECT AN ADDITIONAL 25U. (Patient taking differently: Inject 35-135 Units into the skin See admin instructions. Inject 125 units subcutaneously at breakfast and at lunch, 135 units at supper. If sugar is greater than 200 at bedtime inject an additional 35 units.) 60 mL 2  . acetaminophen (TYLENOL) 500 MG tablet Take 1,000 mg by mouth every 6 (six) hours as needed for moderate pain or headache. (Patient not taking: Reported on 04/18/2020)     No facility-administered medications prior to visit.    Allergies  Allergen Reactions  . Omeprazole Anaphylaxis and Swelling    SWELLING OF TONGUE AND THROAT  . Actos [Pioglitazone] Other (See Comments)    Weight gain  . Benzocaine-Menthol Swelling    SWELLING OF MOUTH  . Colesevelam Other (See Comments)    GI UPSET  . Flagyl [Metronidazole Hcl] Other (See Comments)    DIAPHORESIS  . Metformin And Related Diarrhea  . Shrimp [Shellfish Allergy] Itching    OF THROAT AND EARS  . Statins Palpitations  . Desipramine Hcl Itching, Nausea Only and Other (See Comments)    "swimmy" headed, ears itched   . Hydromorphone Itching  . Jardiance [Empagliflozin] Other (See Comments)    weakness  . Lactose Intolerance (Gi)  Diarrhea    Gas, bloating  . Adhesive [Tape] Other (See Comments)    SKIN IRRITATION AND BRUISING  . Nisoldipine Itching  . Percocet [Oxycodone-Acetaminophen] Itching    ROS As per HPI  PE: Vitals with BMI 04/18/2020 01/17/2020 01/13/2020  Height 5' 1" 5' 1" 5' 1"  Weight 234 lbs 3 oz 230 lbs 8 oz 231 lbs 3 oz  BMI 44.27 32.12 24.82  Systolic 96 500 370  Diastolic 58 64 69  Pulse 70 80  66  O2 sat on RA today is 95%  Gen: Alert, well appearing.  Patient is oriented to person, place, time, and situation. AFFECT: pleasant, lucid thought and speech, although her thinking seemed clearer towards the end of visit compared to at first. CV: RRR, no m/r/g.   LUNGS: CTA bilat, nonlabored resps, good aeration in all lung fields. EXT: no clubbing or cyanosis.  3+ bilat LL pitting edema, leathery/fibrotic skin changes w/out erythema or tenderness.    LABS:  Lab Results  Component Value Date   TSH 2.54 08/18/2019   Lab Results  Component Value Date   WBC 4.6 01/13/2020   HGB 9.8 (L) 01/25/2020   HCT 31.1 (L) 01/25/2020   MCV 82.0 01/13/2020   PLT 137 (L) 01/13/2020   Lab Results  Component Value Date   IRON 27 (L) 01/13/2020   TIBC 357 01/13/2020   FERRITIN 7 (L) 01/13/2020   Lab Results  Component Value Date   CREATININE 0.71 01/13/2020   BUN 9 01/13/2020   NA 140 01/13/2020   K 3.7 01/13/2020   CL 105 01/13/2020   CO2 27 01/13/2020   Lab Results  Component Value Date   ALT 21 01/13/2020   AST 32 01/13/2020   ALKPHOS 89 08/18/2019   BILITOT 0.5 01/13/2020   Lab Results  Component Value Date   CHOL 224 (H) 12/01/2017   Lab Results  Component Value Date   HDL 38.60 (L) 12/01/2017   Lab Results  Component Value Date   LDLCALC 156 (H) 12/01/2017   Lab Results  Component Value Date   TRIG 149.0 12/01/2017   Lab Results  Component Value Date   CHOLHDL 6 12/01/2017   Lab Results  Component Value Date   HGBA1C 7.3 (H) 01/13/2020   POC glucose today was 117 (fasting, no insulin this morning).  IMPRESSION AND PLAN:  1) Hypotension: minimally symptomatic this morning,Felt back to normal by the end of visit. ? Cognitive slowing early in appt from marginally low/"relative" hypoglycemia. No sign of acute blood loss, but having chronic blood loss via nosebleeds. Her first low bp was this am, so I don't think the blood loss chronically would have led  to sudden bp drop this morning. She did take her metop this morning already. Plan/instructions: "Do not take your metoprolol dose later today. Check bp and heart rate 2-3 times per day over the next 1 wk and write these numbers down. If bp gets back above 130 on top then restart metoprolol ONE TAB ONCE A DAY. If bp remains over 130 on top after starting back on metoprolol then increase this med to 1 tab twice a day. CBC and lytes/cr today.  2) DM 2: erratic gluc's as per her usual. No clearly discernable pattern that would be predictable enough to base a sensible dosing change recommendation on.  Will go ahead and simply try to avoid hypoglycemia by decreasing U-500 dosing at each dose: AM dec to 123, lunch dec to 128, supper  dec to 123, and dec to 20 U for hs prn gluc >200. HbA1c today. Lytes/cr today.  Fortunately, renal function has consistently been normal.  3) IDA, hx of suspected iron malabsorption, hx of neg w/ou in the past year as far as endoscopy/givens study. Has been on PO iron x 3 mo. Has had melena-type stools but not clear whether this if from upper GIB or just her chronic stool consistency + iron discoloration. CBC and iron studies today. If Hb dropped signif compared to last check then stop xarelto (esp in light of low bp today and ongoing problem with recurrent nose bleeds).  If Hb stable or improved then we'll keep her on xarelto.  4) Epistaxis, recurrent--ever since being on xarelto this has been problematic for her. We'll ask ENT to re-eval the lesion on nasal septum to see if cauterization is an option.  5) NAFLD: hepatic panel today. Ongoing investigation as to possible cirrhosis per Dr Laural [name] (possible future liver bx vs ultrasound elastography).   Spent 60 min with pt today reviewing HPI, reviewing relevant past history, doing exam, reviewing and discussing lab and imaging data, and formulating plans.   An After Visit Summary was printed and given to the  patient.  FOLLOW UP: Return in about 1 week (around 04/25/2020) for f/u glucs/bps.  Signed:  Crissie Sickles, MD           04/18/2020

## 2020-04-19 LAB — IRON,TIBC AND FERRITIN PANEL
Ferritin: 93 ng/mL (ref 16–288)
Iron: 232 ug/dL — ABNORMAL HIGH (ref 45–160)

## 2020-04-19 LAB — RETICULOCYTES
ABS Retic: 99750 cells/uL — ABNORMAL HIGH (ref 20000–8000)
Retic Ct Pct: 2.5 %

## 2020-04-25 ENCOUNTER — Encounter: Payer: Self-pay | Admitting: Family Medicine

## 2020-04-25 ENCOUNTER — Ambulatory Visit: Payer: Medicare PPO | Admitting: Family Medicine

## 2020-04-25 ENCOUNTER — Other Ambulatory Visit: Payer: Self-pay

## 2020-04-25 ENCOUNTER — Other Ambulatory Visit: Payer: Self-pay | Admitting: Family Medicine

## 2020-04-25 VITALS — BP 119/72 | HR 72 | Temp 98.5°F | Resp 16 | Ht 61.0 in | Wt 234.0 lb

## 2020-04-25 DIAGNOSIS — R040 Epistaxis: Secondary | ICD-10-CM

## 2020-04-25 DIAGNOSIS — I952 Hypotension due to drugs: Secondary | ICD-10-CM | POA: Diagnosis not present

## 2020-04-25 DIAGNOSIS — D508 Other iron deficiency anemias: Secondary | ICD-10-CM | POA: Diagnosis not present

## 2020-04-25 DIAGNOSIS — K5180 Other ulcerative colitis without complications: Secondary | ICD-10-CM | POA: Diagnosis not present

## 2020-04-25 DIAGNOSIS — I10 Essential (primary) hypertension: Secondary | ICD-10-CM | POA: Diagnosis not present

## 2020-04-25 DIAGNOSIS — Z79899 Other long term (current) drug therapy: Secondary | ICD-10-CM | POA: Diagnosis not present

## 2020-04-25 DIAGNOSIS — E11649 Type 2 diabetes mellitus with hypoglycemia without coma: Secondary | ICD-10-CM

## 2020-04-25 MED ORDER — METOPROLOL SUCCINATE ER 100 MG PO TB24: ORAL_TABLET | ORAL | 0 refills | Status: DC

## 2020-04-25 NOTE — Progress Notes (Addendum)
OFFICE VISIT  04/25/2020   CC:  Chief Complaint  Patient presents with  . Follow-up    glucose/BP   HPI:    Patient is a 74 y.o. Caucasian female who presents for 1 wk f/u blood pressures and glucoses. A/P as of last visit: "1) Hypotension: minimally symptomatic this morning,Felt back to normal by the end of visit. ? Cognitive slowing early in appt from marginally low/"relative" hypoglycemia. No sign of acute blood loss, but having chronic blood loss via nosebleeds. Her first low bp was this am, so I don't think the blood loss chronically would have led to sudden bp drop this morning. She did take her metop this morning already. Plan/instructions: "Do not take your metoprolol dose later today. Check bp and heart rate 2-3 times per day over the next 1 wk and write these numbers down. If bp gets back above 130 on top then restart metoprolol ONE TAB ONCE A DAY. If bp remains over 130 on top after starting back on metoprolol then increase this med to 1 tab twice a day. CBC and lytes/cr today.  2) DM 2: erratic gluc's as per her usual. No clearly discernable pattern that would be predictable enough to base a sensible dosing change recommendation on.  Will go ahead and simply try to avoid hypoglycemia by decreasing U-500 dosing at each dose: AM dec to 123, lunch dec to 128, supper dec to 123, and dec to 20 U for hs prn gluc >200. HbA1c today. Lytes/cr today.  Fortunately, renal function has consistently been normal.  3) IDA, hx of suspected iron malabsorption, hx of neg w/ou in the past year as far as endoscopy/givens study. Has been on PO iron x 3 mo. Has had melena-type stools but not clear whether this if from upper GIB or just her chronic stool consistency + iron discoloration. CBC and iron studies today. If Hb dropped signif compared to last check then stop xarelto (esp in light of low bp today and ongoing problem with recurrent nose bleeds).  If Hb stable or improved then we'll  keep her on xarelto.  4) Epistaxis, recurrent--ever since being on xarelto this has been problematic for her. We'll ask ENT to re-eval the lesion on nasal septum to see if cauterization is an option.  5) NAFLD: hepatic panel today. Ongoing investigation as to possible cirrhosis per Dr Laural [name] (possible future liver bx vs ultrasound elastography)."  INTERIM HX: All blood work last visit was great except very mild elev of AST and ALT. Decreased iron to 1 qod. A1c 6.8%.  BPs have been checked last few days and avg 145/80s, HR avg 70. No dizziness, no weakness, no tremors.  No hypoglycemia.  She has not been contacted to schedule ENT appt yet---for further eval of her recurrent epistaxis.  Past Medical History:  Diagnosis Date  . Anxiety   . 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 ? to get liver bx if she gets bariatric surgery? Mild portal hypertensive gastropathy on EGD 08/2019.  Marland Kitchen Fibromyalgia   . GERD   . History of thrombocytopenia 12/2011  . Hyperlipidemia    Intolerant of statins  . HYPERTENSION   . IBS (irritable bowel syndrome)    -D.  Good response to bentyl and imodium as of 06/2018 GI f/u.  . IDA (iron deficiency anemia) 12/2018   Possibly inadequate absorption secondary to chronic/long term PPI therapy.  No overt GI bleding. Endoscopic w/u including givens unrevealing. Dr. Laural [name] suspects poor iron absorption.  Oral iron restarted 12/2019.    Marland Kitchen IDDM (insulin dependent diabetes mellitus)    with DPN (managed by Dr. Cruzita Lederer but then in 2018 pt preferred to have me manage for her convenience)  . Limited mobility    Requires a walker for arthritic pain, widespread musculoskeletal pain, and neuropathic pain.  . Morbid obesity ([place])    As of 11/2018, pt considering sleeve gastectomy vs bipass as of eval by Dr. De Burrs considering as of 12/2019.  Marland Kitchen  Nonalcoholic steatohepatitis    Viral Hep screens NEG.  CT 2015.  Transaminasemia.  U/S 07/2018 showed early changes of cirrhosis.  . OSA (obstructive sleep apnea) 09/14/2015   sleep study 09/07/15: severe obstructive sleep apnea with an AHI of 72 and SaO2 low of 75%.>refer to sleep med for eval and tx options  . Osteoarthritis    hips, shoulders, knees  . PAF (paroxysmal atrial fibrillation) ([place])    One documented episode (after getting EGD 2016).  Was on amiodarone x 3 mo.  Rate control with metoprolol + anticoag with xarelto.   . Small fiber neuropathy    Due to DM.  Symmetric hands and feet tingling/numbness.  Marland Kitchen Ulcerative colitis ([place])    Remicade infusion Q 8 weeks: in clinical and endoscopic remission as of 12/2018 GI f/u.  06/11/19 rpt colonoscopy->cecal and ascending colon colitis.    Past Surgical History:  Procedure Laterality Date  . ABDOMINAL HYSTERECTOMY  1980   Paps no longer indicated.  Marland Kitchen BACTERIAL OVERGROWTH TEST N/A 07/13/2015   Procedure: BACTERIAL OVERGROWTH TEST;  Surgeon: Rogene Houston, MD;  [facility];  Service: Endoscopy;  Laterality: N/A;  730    . BILATERAL SALPINGOOPHORECTOMY  02/10/2001  . BIOPSY  06/11/2019   Procedure: BIOPSY;  Surgeon: Rogene Houston, MD;  [facility];  Service: Endoscopy;;  colon  . BIOPSY  09/27/2019   Procedure: BIOPSY;  Surgeon: Rogene Houston, MD;  [facility];  Service: Endoscopy;;  gastric duodenal  . BREAST REDUCTION SURGERY  1994   bilat  . CARDIOVASCULAR STRESS TEST  07/2010   Lexiscan myoview: normal  . CARPAL TUNNEL RELEASE Right 1996  . CARPAL TUNNEL RELEASE Left 03/21/2003  . CARPAL TUNNEL RELEASE Right 05/04/2013   Procedure: CARPAL TUNNEL RELEASE;  Surgeon: Cammie Sickle., MD;  Location: [facility];  Service: Orthopedics;  Laterality: Right;  . CARPAL TUNNEL RELEASE Left 09/21/2013   Procedure: LEFT CARPAL TUNNEL RELEASE;  Surgeon: Cammie Sickle., MD;   Location: [facility];  Service: Orthopedics;  Laterality: Left;  . CHOLECYSTECTOMY    . COLONOSCOPY WITH PROPOFOL N/A 08/04/2015   Colitis in remission.  No polyps.  Procedure: COLONOSCOPY WITH PROPOFOL;  Surgeon: Rogene Houston, MD;  [facility]: [facility];  Service: Endoscopy;  Laterality: N/A;  cecum time in  0820   time out  0827    total time 7 minutes  . COLONOSCOPY WITH PROPOFOL N/A 06/11/2019   cecal and ascending colon colitis.  Procedure: COLONOSCOPY WITH PROPOFOL;  Surgeon: Rogene Houston, MD;  [facility];  Service: Endoscopy;  Laterality: N/A;  730a  . ESOPHAGEAL DILATION N/A 08/04/2015   Procedure: ESOPHAGEAL DILATION;  Surgeon: Rogene Houston, MD;  [facility];  Service: Endoscopy;  Laterality: N/A;  [place] 57, no mucousal disruption  . ESOPHAGOGASTRODUODENOSCOPY  09/27/2019  Performed for IDA.  Esoph dilation was done but no stricture present.  Mild portal hypertensive gastropathy, o/w normal.  Duodenal bx NEG.  h pylori neg.  Marland Kitchen ESOPHAGOGASTRODUODENOSCOPY (EGD) WITH ESOPHAGEAL DILATION  12/02/2005  . ESOPHAGOGASTRODUODENOSCOPY (EGD) WITH PROPOFOL N/A 08/04/2015   Procedure: ESOPHAGOGASTRODUODENOSCOPY (EGD) WITH PROPOFOL;  Surgeon: Rogene Houston, MD;  [facility];  Service: Endoscopy;  Laterality: N/A;  procedure 1  . ESOPHAGOGASTRODUODENOSCOPY (EGD) WITH PROPOFOL N/A 09/27/2019   Procedure: ESOPHAGOGASTRODUODENOSCOPY (EGD) WITH PROPOFOL;  Surgeon: Rogene Houston, MD;  [facility];  Service: Endoscopy;  Laterality: N/A;  12:10  . FLEXIBLE SIGMOIDOSCOPY  01/17/2012   Procedure: FLEXIBLE SIGMOIDOSCOPY;  Surgeon: Rogene Houston, MD;  [facility];  Service: Endoscopy;  Laterality: N/A;  . GIVENS CAPSULE STUDY N/A 08/03/2019   Procedure: GIVENS CAPSULE STUDY (performed for IDA)->some food debris in stomach and small amount of blood.  Surgeon: Rogene Houston, MD;  [facility];  Service: Endoscopy;  Laterality: N/A;   730AM  . HEMILAMINOTOMY LUMBAR SPINE Bilateral 09/07/1999   L4-5  . KNEE ARTHROSCOPY Right 01/1999; 10/2000  . LYSIS OF ADHESION  02/10/2001  . MALONEY DILATION  09/27/2019   Procedure: MALONEY DILATION;  Surgeon: Rogene Houston, MD;  [facility];  Service: Endoscopy;;  . [place]; 09/12/2006  . TARSAL TUNNEL RELEASE  2002  . TRANSTHORACIC ECHOCARDIOGRAM  08/04/2015   EF 60-65%, normal wall motion, mild LVH, mild LA dilation, grd I DD.  Marland Kitchen TUMOR EXCISION Left 03/21/2003   dorsal 1st web space (hand)  . URETEROLYSIS Right 02/10/2001    Outpatient Medications Prior to Visit  Medication Sig Dispense Refill  . ACCU-CHEK GUIDE test strip USE TO CHECK BLOOD GLUCOSE UP TO 6 TIMES DAILY AS DIRECTED 200 strip 1  . ACCU-CHEK GUIDE test strip     . Accu-Chek Softclix Lancets lancets USE TO CHECK BLOOD GLUCOSE UP TO 6 TIMES DAILY AS DIRECTED 200 each 1  . blood glucose meter kit and supplies KIT Use up to six times daily as directed. DX. E11.9 1 each 0  . citalopram (CELEXA) 20 MG tablet TAKE 1 TABLET BY MOUTH EVERY DAY 90 tablet 0  . Cyanocobalamin (B-12) 5000 MCG CAPS Take 5,000 mcg by mouth daily.    Marland Kitchen dicyclomine (BENTYL) 10 MG capsule TAKE 1 CAPSULE (10 MG TOTAL) BY MOUTH 3 (THREE) TIMES DAILY BEFORE MEALS. (Patient taking differently: Take 10 mg by mouth 3 (three) times daily before meals. Pt takes twice daily before meals.) 270 capsule 3  . esomeprazole (NEXIUM) 20 MG capsule Take 20 mg by mouth daily at 12 noon.    . ferrous sulfate 325 (65 FE) MG tablet Take 1 tablet (325 mg total) by mouth 2 (two) times daily with a meal.    . furosemide (LASIX) 40 MG tablet TAKE 1 TABLET BY MOUTH EVERY DAY 90 tablet 1  . gabapentin (NEURONTIN) 600 MG tablet 1.5 tabs po bid (Patient taking differently: Take 900 mg by mouth 2 (two) times daily. ) 270 tablet 3  . inFLIXimab (REMICADE) 100 MG injection Inject 100 mg into the vein every 8 (eight) weeks.     . Insulin Pen Needle (B-D UF  III MINI PEN NEEDLES) 31G X 5 MM MISC USE TO INJECT INSULINS EQUAL TO 6 TIMES DAILY. 600 each 5  . insulin regular human CONCENTRATED (HUMULIN R U-500 KWIKPEN) 500 UNIT/ML kwikpen 123 Units at BF, 128 Units at lunch, and  123 Units at supper.  Take an additional 20 Units at bedtime if glucose is >200. 60 mL 2  . KLOR-CON M10 10 MEQ tablet TAKE 1 TABLET BY MOUTH EVERY DAY 90 tablet 1  . lactase (LACTAID) 3000 UNITS tablet Take 3,000 Units by mouth as needed (when eating foods containing dairy).     Marland Kitchen loperamide (IMODIUM) 2 MG capsule Take 2-4 mg by mouth as needed for diarrhea or loose stools.  30 capsule   . meclizine (ANTIVERT) 25 MG tablet Take 1 tablet (25 mg total) by mouth 3 (three) times daily as needed for dizziness or nausea. 90 tablet 0  . Multiple Vitamin (MULTIVITAMIN WITH MINERALS) TABS tablet Take 1 tablet by mouth daily.    . TRULICITY 1.5 XA/1.2IN SOPN INJECT UNDER SKIN 1.5 MG WEEKLY 12 pen 3  . XARELTO 20 MG TABS tablet TAKE 1 TABLET (20 MG TOTAL) BY MOUTH DAILY WITH SUPPER. 90 tablet 1  . metoprolol succinate (TOPROL-XL) 100 MG 24 hr tablet TAKE 1 TABLET BY MOUTH 2 TIMES DAILY. TAKE WITH OR IMMEDIATELY FOLLOWING A MEAL 180 tablet 0  . acetaminophen (TYLENOL) 500 MG tablet Take 1,000 mg by mouth every 6 (six) hours as needed for moderate pain or headache. (Patient not taking: Reported on 04/18/2020)     No facility-administered medications prior to visit.    Allergies  Allergen Reactions  . Omeprazole Anaphylaxis and Swelling    SWELLING OF TONGUE AND THROAT  . Actos [Pioglitazone] Other (See Comments)    Weight gain  . Benzocaine-Menthol Swelling    SWELLING OF MOUTH  . Colesevelam Other (See Comments)    GI UPSET  . Flagyl [Metronidazole Hcl] Other (See Comments)    DIAPHORESIS  . Metformin And Related Diarrhea  . Shrimp [Shellfish Allergy] Itching    OF THROAT AND EARS  . Statins Palpitations  . Desipramine Hcl Itching, Nausea Only and Other (See Comments)    "swimmy"  headed, ears itched   . Hydromorphone Itching  . Jardiance [Empagliflozin] Other (See Comments)    weakness  . Lactose Intolerance (Gi) Diarrhea    Gas, bloating  . Adhesive [Tape] Other (See Comments)    SKIN IRRITATION AND BRUISING  . Nisoldipine Itching  . Percocet [Oxycodone-Acetaminophen] Itching    ROS As per HPI  PE: Vitals with BMI 04/25/2020 04/18/2020 01/17/2020  Height 5' 1"  5' 1"  5' 1"   Weight 234 lbs 234 lbs 3 oz 230 lbs 8 oz  BMI 44.24 86.76 72.09  Systolic 470 96 962  Diastolic 72 58 64  Pulse 72 70 80  O2 sat today on RA is 95%  Gen: Alert, well appearing.  Patient is oriented to person, place, time, and situation. AFFECT: pleasant, lucid thought and speech. No further exam today.  LABS:  Lab Results  Component Value Date   TSH 2.54 08/18/2019   Lab Results  Component Value Date   WBC 5.4 04/18/2020   HGB 13.4 04/18/2020   HCT 38.7 04/18/2020   MCV 96.5 04/18/2020   PLT 108.0 (L) 04/18/2020   Lab Results  Component Value Date   IRON 232 (H) 04/18/2020   TIBC CANCELED 04/18/2020   FERRITIN 93 04/18/2020    Lab Results  Component Value Date   CREATININE 0.59 04/18/2020   BUN 7 04/18/2020   NA 141 04/18/2020   K 3.6 04/18/2020   CL 103 04/18/2020   CO2 32 04/18/2020   Lab Results  Component Value Date   ALT 44 (H) 04/18/2020  AST 54 (H) 04/18/2020   ALKPHOS 67 04/18/2020   BILITOT 0.7 04/18/2020   Lab Results  Component Value Date   CHOL 202 (H) 04/18/2020   Lab Results  Component Value Date   HDL 41.50 04/18/2020   Lab Results  Component Value Date   LDLCALC 139 (H) 04/18/2020   Lab Results  Component Value Date   TRIG 108.0 04/18/2020   Lab Results  Component Value Date   CHOLHDL 5 04/18/2020   Lab Results  Component Value Date   HGBA1C 6.8 (H) 04/18/2020   IMPRESSION AND PLAN:  1) Hypotension: no further episodes.  BPs consistently mild elev systolics, HRs ok avg about 70.   Will have her restart toprol xl  165m, ONE qd.  DO NOT restart the ADDITIONAL 1063mtoprol xl she had been taking. Continue home bp monitoring, rx given to pt today for wrist bp cuff so she can check bp at home unassisted. Call/return if recurrence of low bp OR if BP consistently >145/85.  2) Symptomatic hypoglycemia. No further episodes since last visit 1 wk ago. Review of glucoses today show most in 100s - 200s, highly variable as per her usual. Last a1c 1 wk ago very good: 6.8%. No changes in U 500 dosing today (123 qAM, 128 lunch, 123 supper, max of 20 qhs prn gluc >200).  3) Recurrent epistaxis: question of whether this has had a role in her hx of IDA. We'll check into the status of the ENT referral I ordered 1 wk ago. Recent Hb and iron testing were normal so I've decreased her iron to 1 tab qd.  An After Visit Summary was printed and given to the patient.  FOLLOW UP: Return in about 3 months (around 07/26/2020) for routine chronic illness f/u.  Signed:  PhCrissie SicklesMD           04/25/2020

## 2020-04-25 NOTE — Addendum Note (Signed)
Addended by: Tammi Sou on: 04/25/2020 12:05 PM   Modules accepted: Level of Service

## 2020-05-04 ENCOUNTER — Other Ambulatory Visit: Payer: Self-pay | Admitting: Family Medicine

## 2020-05-15 DIAGNOSIS — R040 Epistaxis: Secondary | ICD-10-CM | POA: Diagnosis not present

## 2020-05-15 DIAGNOSIS — J3489 Other specified disorders of nose and nasal sinuses: Secondary | ICD-10-CM | POA: Diagnosis not present

## 2020-05-15 DIAGNOSIS — Z7901 Long term (current) use of anticoagulants: Secondary | ICD-10-CM | POA: Diagnosis not present

## 2020-05-23 ENCOUNTER — Other Ambulatory Visit: Payer: Self-pay | Admitting: Family Medicine

## 2020-06-13 ENCOUNTER — Other Ambulatory Visit: Payer: Self-pay | Admitting: Cardiology

## 2020-06-14 NOTE — Telephone Encounter (Signed)
Prescription last filled by patients primary care Dr. Anitra Lauth, please review request. [name]

## 2020-06-16 NOTE — Progress Notes (Signed)
74 y.o. G1P1 Married White or Caucasian female here for breast and pelvic exam.  I am also following her for BMD density.  Last BMD was 7/30/202 with mild osteopenia.  Repeat follow up 5 years.    Denies vaginal bleeding.  Using CPAP and she thinks this has really helped her sleep/reest better.    She had significant anemia this past year.  She's having nose bleeds.  Saw ENT to try and see if can get this to stop.  Has not been able to come up with phone number for follow up.  She remembered providers first name.  We figured out who the provider is and she will call.  Also wants me to look at her LE.  Has venous stasis and can cut self when shaves.  Wants to know if anything will help.  Weigh loss and compression stockings discussed.  No LMP recorded. Patient has had a hysterectomy.          Sexually active: No.  H/O STD:  no  Health Maintenance: PCP:  Shawnie Dapper  Last wellness appt was last year  Did blood work at that appt:  yes Vaccines are up to date:  yes Colonoscopy: 06/2019, Dr. Laural [name], follow up 5 years MMG:  04-29-2019 category c density birads 1:neg.  Aware this is due.   BMD:  04-29-2019 mild osteopenia, f/u 45yr Last pap smear:  2012 neg   H/o abnormal pap smear:  no    reports that she has never smoked. She has never used smokeless tobacco. She reports previous alcohol use. She reports that she does not use drugs.  Past Medical History:  Diagnosis Date  . Anxiety   . 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 Cirrhosis, nonalcoholic ([place] [id number]  NASH--> early cirrhotic changes on ultrasound 07/2018. ? to get liver bx if she gets bariatric surgery? Mild portal hypertensive gastropathy on EGD 08/2019.  .Marland KitchenFibromyalgia   . GERD   . History of thrombocytopenia 12/2011  . Hyperlipidemia    Intolerant of statins  . HYPERTENSION   . IBS (irritable bowel syndrome)    -D.  Good response to bentyl and imodium as of 06/2018 GI f/u.  . IDA (iron deficiency  anemia) 12/2018   Possibly inadequate absorption secondary to chronic/long term PPI therapy. No overt GI bleding. Endoscopic w/u including givens unrevealing. Dr. RLaural Goldensuspects poor iron absorption.  Oral iron restarted 12/2019.    .Marland KitchenIDDM (insulin dependent diabetes mellitus)    with DPN (managed by Dr. GCruzita Ledererbut then in 2018 pt preferred to have me manage for her convenience)  . Limited mobility    Requires a walker for arthritic pain, widespread musculoskeletal pain, and neuropathic pain.  . Morbid obesity ([place]    As of 11/2018, pt considering sleeve gastectomy vs bipass as of eval by Dr. NDe Burrsconsidering as of 12/2019.  .Marland KitchenNonalcoholic steatohepatitis    Viral Hep screens NEG.  CT 2015.  Transaminasemia.  U/S 07/2018 showed early changes of cirrhosis.  . OSA (obstructive sleep apnea) 09/14/2015   sleep study 09/07/15: severe obstructive sleep apnea with an AHI of 72 and SaO2 low of 75%.>refer to sleep med for eval and tx options  . Osteoarthritis    hips, shoulders, knees  . PAF (paroxysmal atrial fibrillation) ([place]    One documented episode (after getting EGD 2016).  Was on amiodarone x 3 mo.  Rate control with metoprolol + anticoag with xarelto.   .Marland Kitchen  Small fiber neuropathy    Due to DM.  Symmetric hands and feet tingling/numbness.  Marland Kitchen Ulcerative colitis ([place])    Remicade infusion Q 8 weeks: in clinical and endoscopic remission as of 12/2018 GI f/u.  06/11/19 rpt colonoscopy->cecal and ascending colon colitis.    Past Surgical History:  Procedure Laterality Date  . ABDOMINAL HYSTERECTOMY  1980   Paps no longer indicated.  Marland Kitchen BACTERIAL OVERGROWTH TEST N/A 07/13/2015   Procedure: BACTERIAL OVERGROWTH TEST;  Surgeon: Rogene Houston, MD;  [facility];  Service: Endoscopy;  Laterality: N/A;  730    . BILATERAL SALPINGOOPHORECTOMY  02/10/2001  . BIOPSY  06/11/2019   Procedure: BIOPSY;  Surgeon: Rogene Houston, MD;  [facility];  Service: Endoscopy;;  colon   . BIOPSY  09/27/2019   Procedure: BIOPSY;  Surgeon: Rogene Houston, MD;  [facility];  Service: Endoscopy;;  gastric duodenal  . BREAST REDUCTION SURGERY  1994   bilat  . CARDIOVASCULAR STRESS TEST  07/2010   Lexiscan myoview: normal  . CARPAL TUNNEL RELEASE Right 1996  . CARPAL TUNNEL RELEASE Left 03/21/2003  . CARPAL TUNNEL RELEASE Right 05/04/2013   Procedure: CARPAL TUNNEL RELEASE;  Surgeon: Cammie Sickle., MD;  Location: [facility];  Service: Orthopedics;  Laterality: Right;  . CARPAL TUNNEL RELEASE Left 09/21/2013   Procedure: LEFT CARPAL TUNNEL RELEASE;  Surgeon: Cammie Sickle., MD;  Location: [facility];  Service: Orthopedics;  Laterality: Left;  . CHOLECYSTECTOMY    . COLONOSCOPY WITH PROPOFOL N/A 08/04/2015   Colitis in remission.  No polyps.  Procedure: COLONOSCOPY WITH PROPOFOL;  Surgeon: Rogene Houston, MD;  [facility];  Service: Endoscopy;  Laterality: N/A;  cecum time in  0820   time out  0827    total time 7 minutes  . COLONOSCOPY WITH PROPOFOL N/A 06/11/2019   cecal and ascending colon colitis.  Procedure: COLONOSCOPY WITH PROPOFOL;  Surgeon: Rogene Houston, MD;  [facility];  Service: Endoscopy;  Laterality: N/A;  730a  . ESOPHAGEAL DILATION N/A 08/04/2015   Procedure: ESOPHAGEAL DILATION;  Surgeon: Rogene Houston, MD;  [facility];  Service: Endoscopy;  Laterality: N/A;  [place] 56, no mucousal disruption  . ESOPHAGOGASTRODUODENOSCOPY  09/27/2019   Performed for IDA.  Esoph dilation was done but no stricture present.  Mild portal hypertensive gastropathy, o/w normal.  Duodenal bx NEG.  h pylori neg.  Marland Kitchen ESOPHAGOGASTRODUODENOSCOPY (EGD) WITH ESOPHAGEAL DILATION  12/02/2005  . ESOPHAGOGASTRODUODENOSCOPY (EGD) WITH PROPOFOL N/A 08/04/2015   Procedure: ESOPHAGOGASTRODUODENOSCOPY (EGD) WITH PROPOFOL;  Surgeon: Rogene Houston, MD;  [facility];  Service: Endoscopy;  Laterality: N/A;  procedure 1  .  ESOPHAGOGASTRODUODENOSCOPY (EGD) WITH PROPOFOL N/A 09/27/2019   Procedure: ESOPHAGOGASTRODUODENOSCOPY (EGD) WITH PROPOFOL;  Surgeon: Rogene Houston, MD;  [facility];  Service: Endoscopy;  Laterality: N/A;  12:10  . FLEXIBLE SIGMOIDOSCOPY  01/17/2012   Procedure: FLEXIBLE SIGMOIDOSCOPY;  Surgeon: Rogene Houston, MD;  [facility];  Service: Endoscopy;  Laterality: N/A;  . GIVENS CAPSULE STUDY N/A 08/03/2019   Procedure: GIVENS CAPSULE STUDY (performed for IDA)->some food debris in stomach and small amount of blood.  Surgeon: Rogene Houston, MD;  [facility];  Service: Endoscopy;  Laterality: N/A;  730AM  . HEMILAMINOTOMY LUMBAR SPINE Bilateral 09/07/1999   L4-5  . KNEE ARTHROSCOPY Right 01/1999; 10/2000  . LYSIS OF ADHESION  02/10/2001  .  MALONEY DILATION  09/27/2019   Procedure: MALONEY DILATION;  Surgeon: Rogene Houston, MD;  [facility];  Service: Endoscopy;;  . [place]; 09/12/2006  . TARSAL TUNNEL RELEASE  2002  . TRANSTHORACIC ECHOCARDIOGRAM  08/04/2015   EF 60-65%, normal wall motion, mild LVH, mild LA dilation, grd I DD.  Marland Kitchen TUMOR EXCISION Left 03/21/2003   dorsal 1st web space (hand)  . URETEROLYSIS Right 02/10/2001    Current Outpatient Medications  Medication Sig Dispense Refill  . ACCU-CHEK GUIDE test strip USE TO CHECK BLOOD GLUCOSE UP TO 6 TIMES DAILY AS DIRECTED 200 strip 1  . Accu-Chek Softclix Lancets lancets USE TO CHECK BLOOD GLUCOSE UP TO 6 TIMES DAILY AS DIRECTED 200 each 1  . acetaminophen (TYLENOL) 500 MG tablet Take 1,000 mg by mouth every 6 (six) hours as needed for moderate pain or headache.     . blood glucose meter kit and supplies KIT Use up to six times daily as directed. DX. E11.9 1 each 0  . citalopram (CELEXA) 20 MG tablet TAKE 1 TABLET BY MOUTH EVERY DAY 90 tablet 0  . Cyanocobalamin (B-12) 5000 MCG CAPS Take 5,000 mcg by mouth daily.    Marland Kitchen esomeprazole (NEXIUM) 20 MG capsule Take 20 mg by mouth  daily at 12 noon.    . ferrous sulfate 325 (65 FE) MG tablet Take 1 tablet (325 mg total) by mouth 2 (two) times daily with a meal.    . furosemide (LASIX) 40 MG tablet TAKE 1 TABLET BY MOUTH EVERY DAY 90 tablet 1  . gabapentin (NEURONTIN) 600 MG tablet 1.5 tabs po bid (Patient taking differently: Take 900 mg by mouth 2 (two) times daily. ) 270 tablet 3  . inFLIXimab (REMICADE) 100 MG injection Inject 100 mg into the vein every 8 (eight) weeks.     . Insulin Pen Needle (B-D UF III MINI PEN NEEDLES) 31G X 5 MM MISC USE TO INJECT INSULINS EQUAL TO 6 TIMES DAILY. 600 each 5  . insulin regular human CONCENTRATED (HUMULIN R U-500 KWIKPEN) 500 UNIT/ML kwikpen 123 Units at BF, 128 Units at lunch, and 123 Units at supper.  Take an additional 20 Units at bedtime if glucose is >200. 60 mL 2  . KLOR-CON M10 10 MEQ tablet TAKE 1 TABLET BY MOUTH EVERY DAY 90 tablet 1  . lactase (LACTAID) 3000 UNITS tablet Take 3,000 Units by mouth as needed (when eating foods containing dairy).     Marland Kitchen loperamide (IMODIUM) 2 MG capsule Take 2-4 mg by mouth as needed for diarrhea or loose stools.  30 capsule   . meclizine (ANTIVERT) 25 MG tablet Take 1 tablet (25 mg total) by mouth 3 (three) times daily as needed for dizziness or nausea. 90 tablet 0  . metoprolol succinate (TOPROL-XL) 100 MG 24 hr tablet 1 tab po qd 90 tablet 0  . Multiple Vitamin (MULTIVITAMIN WITH MINERALS) TABS tablet Take 1 tablet by mouth daily.    . TRULICITY 1.5 GG/8.3MO SOPN INJECT UNDER SKIN 1.5 MG WEEKLY 12 pen 3  . XARELTO 20 MG TABS tablet TAKE 1 TABLET (20 MG TOTAL) BY MOUTH DAILY WITH SUPPER. 90 tablet 1   No current facility-administered medications for this visit.    Family History  Problem Relation Age of Onset  . Diabetes Mother   . Hypertension Mother   . Heart attack Father        Mid 78's  . Heart disease Father   . Lung  disease Father        spot on lung; had lung surgery  . Alcohol abuse Other   . Hypertension Son   . Diabetes  Son   . Emphysema Maternal Grandfather   . Asthma Maternal Grandfather   . Colon cancer Paternal Grandfather   . Stomach cancer Paternal Grandmother   . Neuropathy Neg Hx     Review of Systems  Constitutional: Negative.   HENT: Negative.   Eyes: Negative.   Respiratory: Negative.   Cardiovascular: Negative.   Gastrointestinal: Negative.   Endocrine: Negative.   Genitourinary: Negative.   Musculoskeletal: Negative.   Skin: Negative.   Allergic/Immunologic: Negative.   Neurological: Negative.   Hematological: Negative.   Psychiatric/Behavioral: Negative.     Exam:   BP 118/62   Pulse 68   Resp 16   Ht 5' 0.75" (1.543 m)   Wt 231 lb (104.8 kg)   BMI 44.01 kg/m   Height: 5' 0.75" (154.3 cm)  General appearance: alert, cooperative and appears stated age Breasts: normal appearance, no masses or tenderness Abdomen: soft, non-tender; bowel sounds normal; no masses,  no organomegaly Skin: erythematous patches under pannus c/w yeast Lymph nodes: Cervical, supraclavicular, and axillary nodes normal.  No abnormal inguinal nodes palpated Neurologic: Grossly normal  Pelvic: External genitalia:  no lesions              Urethra:  normal appearing urethra with no masses, tenderness or lesions              Bartholins and Skenes: normal                 Vagina: normal appearing vagina with normal color and discharge, no lesions              Cervix: absent              Pap taken: No. Bimanual Exam:  Uterus:  uterus absent              Adnexa: no mass, fullness, tenderness               Rectovaginal: Confirms               Anus:  normal sphincter tone, significant hemorrhoids noted on exam  Chaperone, Olene Floss, CMA, was present for exam.  A:  Breast and Pelvic exam PMP, no HRT H/o TAH, then later BSO Then, elevated lipids, diabetes followed by Dr. Williemae Natter Ulcerative COlitis followed by Dr. Laural [name] Iron deficiency anemia probably from epistaxis.  Followed by ENT.   Skin  yeast Venous stasis in LE Hemorrhoids  P:   Mammogram guidelines reviewed pap smear not indicated Colonoscopy due 2025 BMD due again in 2025 Lab work done with Dr. Williemae Natter in July Topical antifungal products discussed. Pt desires to see surgeon for possible removal.  Do not think she should have this done but pt requests seeing specialist. Return annually or prn  28  minutes of total time was spent for this patient encounter, including preparation, face-to-face counseling with the patient and coordination of care, and documentation of the encounter.

## 2020-06-20 ENCOUNTER — Encounter: Payer: Self-pay | Admitting: Obstetrics & Gynecology

## 2020-06-20 ENCOUNTER — Ambulatory Visit (INDEPENDENT_AMBULATORY_CARE_PROVIDER_SITE_OTHER): Payer: Medicare PPO | Admitting: Obstetrics & Gynecology

## 2020-06-20 ENCOUNTER — Other Ambulatory Visit: Payer: Self-pay

## 2020-06-20 VITALS — BP 118/62 | HR 68 | Resp 16 | Ht 60.75 in | Wt 231.0 lb

## 2020-06-20 DIAGNOSIS — Z862 Personal history of diseases of the blood and blood-forming organs and certain disorders involving the immune mechanism: Secondary | ICD-10-CM

## 2020-06-20 DIAGNOSIS — Z01419 Encounter for gynecological examination (general) (routine) without abnormal findings: Secondary | ICD-10-CM

## 2020-06-20 DIAGNOSIS — K648 Other hemorrhoids: Secondary | ICD-10-CM | POA: Diagnosis not present

## 2020-06-20 NOTE — Patient Instructions (Addendum)
[name], PA-C [facility]  [address], [address] [place], [place] [address]  [phone]  Lamisil and Lotrimin for skin yeast

## 2020-06-21 DIAGNOSIS — K5180 Other ulcerative colitis without complications: Secondary | ICD-10-CM | POA: Diagnosis not present

## 2020-06-21 DIAGNOSIS — Z79899 Other long term (current) drug therapy: Secondary | ICD-10-CM | POA: Diagnosis not present

## 2020-06-22 DIAGNOSIS — H1045 Other chronic allergic conjunctivitis: Secondary | ICD-10-CM | POA: Diagnosis not present

## 2020-06-22 DIAGNOSIS — H2513 Age-related nuclear cataract, bilateral: Secondary | ICD-10-CM | POA: Diagnosis not present

## 2020-06-22 DIAGNOSIS — E119 Type 2 diabetes mellitus without complications: Secondary | ICD-10-CM | POA: Diagnosis not present

## 2020-06-22 DIAGNOSIS — H43811 Vitreous degeneration, right eye: Secondary | ICD-10-CM | POA: Diagnosis not present

## 2020-06-22 DIAGNOSIS — G4730 Sleep apnea, unspecified: Secondary | ICD-10-CM | POA: Diagnosis not present

## 2020-06-22 DIAGNOSIS — H04123 Dry eye syndrome of bilateral lacrimal glands: Secondary | ICD-10-CM | POA: Diagnosis not present

## 2020-06-22 LAB — HM DIABETES EYE EXAM

## 2020-06-30 ENCOUNTER — Encounter: Payer: Self-pay | Admitting: Family Medicine

## 2020-07-05 ENCOUNTER — Telehealth: Payer: Self-pay

## 2020-07-05 NOTE — Telephone Encounter (Signed)
Call placed to [facility] to follow up on general surgery referral.

## 2020-07-18 ENCOUNTER — Other Ambulatory Visit: Payer: Self-pay | Admitting: Family Medicine

## 2020-07-20 DIAGNOSIS — L820 Inflamed seborrheic keratosis: Secondary | ICD-10-CM | POA: Diagnosis not present

## 2020-07-20 DIAGNOSIS — D225 Melanocytic nevi of trunk: Secondary | ICD-10-CM | POA: Diagnosis not present

## 2020-07-25 ENCOUNTER — Ambulatory Visit (INDEPENDENT_AMBULATORY_CARE_PROVIDER_SITE_OTHER): Payer: Medicare PPO | Admitting: Internal Medicine

## 2020-07-27 ENCOUNTER — Ambulatory Visit (INDEPENDENT_AMBULATORY_CARE_PROVIDER_SITE_OTHER): Payer: Medicare PPO | Admitting: Gastroenterology

## 2020-07-28 DIAGNOSIS — Z7901 Long term (current) use of anticoagulants: Secondary | ICD-10-CM | POA: Diagnosis not present

## 2020-07-28 DIAGNOSIS — R040 Epistaxis: Secondary | ICD-10-CM | POA: Diagnosis not present

## 2020-07-28 DIAGNOSIS — J342 Deviated nasal septum: Secondary | ICD-10-CM | POA: Diagnosis not present

## 2020-07-31 ENCOUNTER — Encounter: Payer: Self-pay | Admitting: Family Medicine

## 2020-08-02 ENCOUNTER — Encounter (INDEPENDENT_AMBULATORY_CARE_PROVIDER_SITE_OTHER): Payer: Self-pay | Admitting: *Deleted

## 2020-08-02 ENCOUNTER — Encounter (INDEPENDENT_AMBULATORY_CARE_PROVIDER_SITE_OTHER): Payer: Self-pay | Admitting: Gastroenterology

## 2020-08-02 ENCOUNTER — Ambulatory Visit (INDEPENDENT_AMBULATORY_CARE_PROVIDER_SITE_OTHER): Payer: Medicare PPO | Admitting: Gastroenterology

## 2020-08-02 ENCOUNTER — Other Ambulatory Visit: Payer: Self-pay

## 2020-08-02 VITALS — BP 137/79 | HR 72 | Temp 99.5°F | Ht 60.75 in | Wt 234.5 lb

## 2020-08-02 DIAGNOSIS — R152 Fecal urgency: Secondary | ICD-10-CM | POA: Diagnosis not present

## 2020-08-02 DIAGNOSIS — K5100 Ulcerative (chronic) pancolitis without complications: Secondary | ICD-10-CM

## 2020-08-02 DIAGNOSIS — R7989 Other specified abnormal findings of blood chemistry: Secondary | ICD-10-CM

## 2020-08-02 MED ORDER — COLESTIPOL HCL 1 G PO TABS: 1.0000 g | ORAL_TABLET | Freq: Two times a day (BID) | ORAL | 3 refills | Status: DC

## 2020-08-02 NOTE — Patient Instructions (Addendum)
Schedule colonoscopy, will obtain clearance from Dr. Felicity Pellegrini Perform blood workup Schedule liver elastography Start Colestid 1 g every 12 h Can take Imodium as needed if still presenting episodes of diarrhea.

## 2020-08-02 NOTE — Progress Notes (Signed)
[name], M.D. Gastroenterology & Hepatology [facility] [address] [place], [place] [address]  Primary Care Physician: Tammi Sou, MD [address] [place] [address]  I will communicate my assessment and recommendations to the referring MD via EMR. "Note: Occasional unusual wording and randomly placed punctuation marks may result from the use of speech recognition technology to transcribe this document"  Problems: 1. Ulcerative pancolitis 2. Fecal incontinence 3. NASH 4. Elevated liver function tests  History of Present Illness: [name] is a 74 y.o. female with past medical history of GERD, fibromyalgia, hyperlipidemia, hypertension, iron deficiency anemia, ulcerative pancolitis, paroxysmal atrial fibrillation on anticoagulation, OSA, NASH, who presents for follow up of ulcerative pancolitis and fecal incontinence.  The patient was last seen on 01/17/2020. At that time, the patient was counseled to continue her ferrous sulfate intake for her iron deficiency anemia.  She was also continued on Remicade every 8 weeks as prior.  Patient reports that she has had chronic episodes of fecal soiling, which she describes as liquid bowel movements.  She has close 2-3 episodes every week.  Usually has 2 bowel movements per day which all of them are liquid in nature but no blood is seen.  However she has noted that she presents some fecal urgency and many times she has fecal soiling when they happen.  Reports that the symptoms have been present for the last 15 years but states that they have worsened recently as she has noted the fecal soiling episodes are happening more frequently.  For this she takes 1 tablet of Imodium every day and she takes 2 tablets when she is going out of her home.  Has tried taking 2 tablets every day but this because significant bloating and hard stool so she has avoided taking this.  Has never  followed any kind of diet and has never tried any resting binders.  Endorses having occasional episodes of abdominal pain in her lower abdomen which are intermittent and transient.  Patient has been compliant with her Remicade every 8 weeks.  The patient denies having any nausea, vomiting, fever, chills, hematochezia, melena, hematemesis, abdominal distention, abdominal pain, diarrhea, jaundice, pruritus or weight loss.  Notably, the patient has lab reports from 04/25/2020 which showed a hemoglobin of 13.6, platelets of 109, CMP showing normal renal function but notable for albumin of 3.5, alkaline phosphatase 80, AST 106, ALT 67 and total bilirubin 0.7.  Last Colonoscopy: 06/11/2019 -no presence of inflammation throughout the colon, although there were mild erosions at the appendiceal orifice and mildly congested mucosa was found in the ascending colon.  Biopsies showed focal colitis with mild edema in the cecum and the right ascending colon but no presence of chronic inflammation.  Last flu shot: Has not received it yet Last pneumonia shot:Given by her PCP COVID-19 shot: Moderna vaccine x2  Past Medical History: Past Medical History:  Diagnosis Date  . Anxiety   . 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 ? to get liver bx if she gets bariatric surgery? Mild portal hypertensive gastropathy on EGD 08/2019.  Marland Kitchen Fibromyalgia   . GERD   . History of thrombocytopenia 12/2011  . Hyperlipidemia    Intolerant of statins  . HYPERTENSION   . IBS (irritable bowel syndrome)    -D.  Good response to bentyl and imodium as  of 06/2018 GI f/u.  . IDA (iron deficiency anemia) 12/2018   Possibly inadequate absorption secondary to chronic/long term PPI therapy. No overt GI bleding. Endoscopic w/u including givens unrevealing. Dr. Laural [name] suspects poor iron absorption.  Oral iron restarted  12/2019.    Marland Kitchen IDDM (insulin dependent diabetes mellitus)    with DPN (managed by Dr. Cruzita Lederer but then in 2018 pt preferred to have me manage for her convenience)  . Limited mobility    Requires a walker for arthritic pain, widespread musculoskeletal pain, and neuropathic pain.  . Morbid obesity ([place])    As of 11/2018, pt considering sleeve gastectomy vs bipass as of eval by Dr. De Burrs considering as of 12/2019.  Marland Kitchen Nonalcoholic steatohepatitis    Viral Hep screens NEG.  CT 2015.  Transaminasemia.  U/S 07/2018 showed early changes of cirrhosis.  . OSA (obstructive sleep apnea) 09/14/2015   sleep study 09/07/15: severe obstructive sleep apnea with an AHI of 72 and SaO2 low of 75%.>refer to sleep med for eval and tx options  . Osteoarthritis    hips, shoulders, knees  . PAF (paroxysmal atrial fibrillation) ([place])    One documented episode (after getting EGD 2016).  Was on amiodarone x 3 mo.  Rate control with metoprolol + anticoag with xarelto.   . Recurrent epistaxis    Granuloma in L nare cauterized by ENT 04/2020. Another cautery 06/2020  . Small fiber neuropathy    Due to DM.  Symmetric hands and feet tingling/numbness.  Marland Kitchen Ulcerative colitis ([place])    Remicade infusion Q 8 weeks: in clinical and endoscopic remission as of 12/2018 GI f/u.  06/11/19 rpt colonoscopy->cecal and ascending colon colitis.    Past Surgical History: Past Surgical History:  Procedure Laterality Date  . ABDOMINAL HYSTERECTOMY  1980   Paps no longer indicated.  Marland Kitchen BACTERIAL OVERGROWTH TEST N/A 07/13/2015   Procedure: BACTERIAL OVERGROWTH TEST;  Surgeon: Rogene Houston, MD;  [facility];  Service: Endoscopy;  Laterality: N/A;  730    . BILATERAL SALPINGOOPHORECTOMY  02/10/2001  . BIOPSY  06/11/2019   Procedure: BIOPSY;  Surgeon: Rogene Houston, MD;  [facility];  Service: Endoscopy;;  colon  . BIOPSY  09/27/2019   Procedure: BIOPSY;  Surgeon: Rogene Houston, MD;  [facility];  Service: Endoscopy;;  gastric duodenal  . BREAST REDUCTION SURGERY  1994   bilat  . CARDIOVASCULAR STRESS TEST  07/2010   Lexiscan myoview: normal  . CARPAL TUNNEL RELEASE Right 1996  . CARPAL TUNNEL RELEASE Left 03/21/2003  . CARPAL TUNNEL RELEASE Right 05/04/2013   Procedure: CARPAL TUNNEL RELEASE;  Surgeon: Cammie Sickle., MD;  Location: [place];  Service: Orthopedics;  Laterality: Right;  . CARPAL TUNNEL RELEASE Left 09/21/2013   Procedure: LEFT CARPAL TUNNEL RELEASE;  Surgeon: Cammie Sickle., MD;  Location: [place];  Service: Orthopedics;  Laterality: Left;  . CHOLECYSTECTOMY    . COLONOSCOPY WITH PROPOFOL N/A 08/04/2015   Colitis in remission.  No polyps.  Procedure: COLONOSCOPY WITH PROPOFOL;  Surgeon: Rogene Houston, MD;  [facility];  Service: Endoscopy;  Laterality: N/A;  cecum time in  0820   time out  0827    total time 7 minutes  . COLONOSCOPY WITH PROPOFOL N/A 06/11/2019   cecal and ascending colon colitis.  Procedure: COLONOSCOPY WITH PROPOFOL;  Surgeon: Rogene Houston, MD;  [facility];  Service: Endoscopy;  Laterality:  N/A;  730a  . ESOPHAGEAL DILATION N/A 08/04/2015   Procedure: ESOPHAGEAL DILATION;  Surgeon: Rogene Houston, MD;  [facility];  Service: Endoscopy;  Laterality: N/A;  [place] 56, no mucousal disruption  . ESOPHAGOGASTRODUODENOSCOPY  09/27/2019   Performed for IDA.  Esoph dilation was done but no stricture present.  Mild portal hypertensive gastropathy, o/w normal.  Duodenal bx NEG.  h pylori neg.  Marland Kitchen ESOPHAGOGASTRODUODENOSCOPY (EGD) WITH ESOPHAGEAL DILATION  12/02/2005  . ESOPHAGOGASTRODUODENOSCOPY (EGD) WITH PROPOFOL N/A 08/04/2015   Procedure: ESOPHAGOGASTRODUODENOSCOPY (EGD) WITH PROPOFOL;  Surgeon: Rogene Houston, MD;  [facility];  Service: Endoscopy;  Laterality: N/A;  procedure 1  . ESOPHAGOGASTRODUODENOSCOPY (EGD) WITH PROPOFOL N/A 09/27/2019   Procedure:  ESOPHAGOGASTRODUODENOSCOPY (EGD) WITH PROPOFOL;  Surgeon: Rogene Houston, MD;  [facility];  Service: Endoscopy;  Laterality: N/A;  12:10  . FLEXIBLE SIGMOIDOSCOPY  01/17/2012   Procedure: FLEXIBLE SIGMOIDOSCOPY;  Surgeon: Rogene Houston, MD;  [facility];  Service: Endoscopy;  Laterality: N/A;  . GIVENS CAPSULE STUDY N/A 08/03/2019   Procedure: GIVENS CAPSULE STUDY (performed for IDA)->some food debris in stomach and small amount of blood.  Surgeon: Rogene Houston, MD;  [facility];  Service: Endoscopy;  Laterality: N/A;  730AM  . HEMILAMINOTOMY LUMBAR SPINE Bilateral 09/07/1999   L4-5  . KNEE ARTHROSCOPY Right 01/1999; 10/2000  . LYSIS OF ADHESION  02/10/2001  . MALONEY DILATION  09/27/2019   Procedure: MALONEY DILATION;  Surgeon: Rogene Houston, MD;  [facility];  Service: Endoscopy;;  . [place]; 09/12/2006  . TARSAL TUNNEL RELEASE  2002  . TRANSTHORACIC ECHOCARDIOGRAM  08/04/2015   EF 60-65%, normal wall motion, mild LVH, mild LA dilation, grd I DD.  Marland Kitchen TUMOR EXCISION Left 03/21/2003   dorsal 1st web space (hand)  . URETEROLYSIS Right 02/10/2001    Family History: Family History  Problem Relation Age of Onset  . Diabetes Mother   . Hypertension Mother   . Heart attack Father        Mid 72's  . Heart disease Father   . Lung disease Father        spot on lung; had lung surgery  . Alcohol abuse Other   . Hypertension Son   . Diabetes Son   . Emphysema Maternal Grandfather   . Asthma Maternal Grandfather   . Colon cancer Paternal Grandfather   . Stomach cancer Paternal Grandmother   . Neuropathy Neg Hx     Social History: Social History   Tobacco Use  Smoking Status Never Smoker  Smokeless Tobacco Never Used   Social History   Substance and Sexual Activity  Alcohol Use Yes   Comment: ocassionally   Social History   Substance and Sexual Activity  Drug Use No    Allergies: Allergies  Allergen  Reactions  . Omeprazole Anaphylaxis and Swelling    SWELLING OF TONGUE AND THROAT  . Actos [Pioglitazone] Other (See Comments)    Weight gain  . Benzocaine-Menthol Swelling    SWELLING OF MOUTH  . Colesevelam Other (See Comments)    GI UPSET  . Flagyl [Metronidazole Hcl] Other (See Comments)    DIAPHORESIS  . Metformin And Related Diarrhea  . Shrimp [Shellfish Allergy] Itching    OF THROAT AND EARS  . Statins Palpitations  . Desipramine Hcl Itching, Nausea Only and Other (See Comments)    "swimmy" headed, ears itched   . Hydromorphone Itching  .  Jardiance [Empagliflozin] Other (See Comments)    weakness  . Lactose Intolerance (Gi) Diarrhea    Gas, bloating  . Adhesive [Tape] Other (See Comments)    SKIN IRRITATION AND BRUISING  . Nisoldipine Itching  . Percocet [Oxycodone-Acetaminophen] Itching    Medications: Current Outpatient Medications  Medication Sig Dispense Refill  . ACCU-CHEK GUIDE test strip USE TO CHECK BLOOD GLUCOSE UP TO 6 TIMES DAILY AS DIRECTED 200 strip 1  . Accu-Chek Softclix Lancets lancets USE TO CHECK BLOOD GLUCOSE UP TO 6 TIMES DAILY AS DIRECTED 200 each 1  . acetaminophen (TYLENOL) 500 MG tablet Take 1,000 mg by mouth every 6 (six) hours as needed for moderate pain or headache.     . blood glucose meter kit and supplies KIT Use up to six times daily as directed. DX. E11.9 1 each 0  . citalopram (CELEXA) 20 MG tablet TAKE 1 TABLET BY MOUTH EVERY DAY 90 tablet 0  . Cyanocobalamin (B-12) 5000 MCG CAPS Take 5,000 mcg by mouth daily.    Marland Kitchen esomeprazole (NEXIUM) 20 MG capsule Take 20 mg by mouth daily at 12 noon.    . ferrous sulfate 325 (65 FE) MG tablet Take 1 tablet (325 mg total) by mouth 2 (two) times daily with a meal.    . furosemide (LASIX) 40 MG tablet TAKE 1 TABLET BY MOUTH EVERY DAY 90 tablet 1  . gabapentin (NEURONTIN) 600 MG tablet 1.5 tabs po bid (Patient taking differently: Take 900 mg by mouth 2 (two) times daily. ) 270 tablet 3  . inFLIXimab  (REMICADE) 100 MG injection Inject 100 mg into the vein every 8 (eight) weeks.     . Insulin Pen Needle (B-D UF III MINI PEN NEEDLES) 31G X 5 MM MISC USE TO INJECT INSULINS EQUAL TO 6 TIMES DAILY. 600 each 5  . insulin regular human CONCENTRATED (HUMULIN R U-500 KWIKPEN) 500 UNIT/ML kwikpen 123 Units at BF, 128 Units at lunch, and 123 Units at supper.  Take an additional 20 Units at bedtime if glucose is >200. 60 mL 2  . KLOR-CON M10 10 MEQ tablet TAKE 1 TABLET BY MOUTH EVERY DAY 90 tablet 1  . lactase (LACTAID) 3000 UNITS tablet Take 3,000 Units by mouth as needed (when eating foods containing dairy).     Marland Kitchen loperamide (IMODIUM) 2 MG capsule Take 2-4 mg by mouth as needed for diarrhea or loose stools.  30 capsule   . meclizine (ANTIVERT) 25 MG tablet Take 1 tablet (25 mg total) by mouth 3 (three) times daily as needed for dizziness or nausea. 90 tablet 0  . metoprolol succinate (TOPROL-XL) 100 MG 24 hr tablet TAKE 1 TABLET BY MOUTH 2 TIMES DAILY. TAKE WITH OR IMMEDIATELY FOLLOWING A MEAL 180 tablet 0  . Multiple Vitamin (MULTIVITAMIN WITH MINERALS) TABS tablet Take 1 tablet by mouth daily.    . TRULICITY 1.5 FF/6.3WG SOPN INJECT UNDER SKIN 1.5 MG WEEKLY 12 pen 3  . XARELTO 20 MG TABS tablet TAKE 1 TABLET (20 MG TOTAL) BY MOUTH DAILY WITH SUPPER. 90 tablet 1   No current facility-administered medications for this visit.    Review of Systems: GENERAL: negative for malaise, night sweats HEENT: No changes in hearing or vision, no nose bleeds or other nasal problems. NECK: Negative for lumps, goiter, pain and significant neck swelling RESPIRATORY: Negative for cough, wheezing CARDIOVASCULAR: Negative for chest pain, leg swelling, palpitations, orthopnea GI: SEE HPI MUSCULOSKELETAL: Negative for joint pain or swelling, back pain, and muscle pain.  SKIN: Negative for lesions, rash PSYCH: Negative for sleep disturbance, mood disorder and recent psychosocial stressors. HEMATOLOGY Negative for  prolonged bleeding, bruising easily, and swollen nodes. ENDOCRINE: Negative for cold or heat intolerance, polyuria, polydipsia and goiter. NEURO: negative for tremor, gait imbalance, syncope and seizures. The remainder of the review of systems is noncontributory.   Physical Exam: BP 137/79 (BP Location: Right Arm, Patient Position: Sitting, Cuff Size: Normal)   Pulse 72   Temp 99.5 F (37.5 C) (Oral)   Ht 5' 0.75" (1.543 m)   Wt 234 lb 8 oz (106.4 kg)   BMI 44.67 kg/m  GENERAL: The patient is AO x3, in no acute distress.  Obese HEENT: Head is normocephalic and atraumatic. EOMI are intact. Mouth is well hydrated and without lesions. NECK: Supple. No masses LUNGS: Clear to auscultation. No presence of rhonchi/wheezing/rales. Adequate chest expansion HEART: RRR, normal s1 and s2. ABDOMEN: Tender to palpation in the epigastric area, no guarding, no peritoneal signs, and nondistended. BS +. No masses. EXTREMITIES: Without any cyanosis, clubbing, rash, lesions or edema. NEUROLOGIC: AOx3, no focal motor deficit. SKIN: no jaundice, no rashes  Imaging/Labs: as above  I personally reviewed and interpreted the available labs, imaging and endoscopic files.  Impression and Plan: [name] is a 74 y.o. female with past medical history of GERD, fibromyalgia, hyperlipidemia, hypertension, iron deficiency anemia, ulcerative pancolitis, paroxysmal atrial fibrillation on anticoagulation, OSA, NASH, who presents for follow up of ulcerative pancolitis and fecal incontinence.  The patient has presented chronic symptoms of incontinence and fecal urgency.  Even though she has presented endoscopic remission and her more recent colonoscopy, she was still presenting significant watery bowel movements and incontinence.  It is unclear if the symptoms are related to her ulcerative colitis or to other etiologies such as functional disease or anorectal incontinence.  Due to this, we will proceed with colonoscopy  to evaluate if there is any active inflammation while on Remicade.  The patient understood and agreed.  We will obtain surveillance labs today.  However, her history of cholecystectomy raises a concern also for bile acid diarrhea, she will be started on Colestid and she can take the Imodium as needed.  She was also counseled to obtain the COVID-19 booster as she has had this given 6 months ago.  Finally, regarding her elevated liver enzymes, this could be related to her history of NASH, but notably she has biochemical abnormalities and imaging findings are concerning for liver cirrhosis which is likely compensated at this point.  We will obtain a liver elastography and rule out other causes of liver cirrhosis, as well as obtaining MELD labs.  - Continue Remicade every 8 weeks - Schedule colonoscopy, will obtain clearance from Dr. Felicity Pellegrini to stop [facility] - Check MELD labs, hepatitis A/B/C serologies, ANA, AMA, ASMA, IgG, TTG IgA -  Schedule liver elastography - Start Colestid 1 g every 12 h - Can take Imodium as needed if still presenting episodes of diarrhea. - covid vaccine booster - RTC6 months  All questions were answered.      Harvel Quale, MD Gastroenterology and Hepatology [facility]

## 2020-08-03 ENCOUNTER — Telehealth (INDEPENDENT_AMBULATORY_CARE_PROVIDER_SITE_OTHER): Payer: Self-pay | Admitting: *Deleted

## 2020-08-03 ENCOUNTER — Encounter (INDEPENDENT_AMBULATORY_CARE_PROVIDER_SITE_OTHER): Payer: Self-pay | Admitting: *Deleted

## 2020-08-03 NOTE — Telephone Encounter (Signed)
   Primary Cardiologist: Kirk Ruths, MD  Chart reviewed as part of pre-operative protocol coverage. Patient was contacted 08/03/2020 in reference to pre-operative risk assessment for pending surgery as outlined below.  [name] was last seen on 08/31/2019 by Dr. Stanford Breed.  Since that day, [name] has done well without chest pain or shortness of breath.  Therefore, based on ACC/AHA guidelines, the patient would be at acceptable risk for the planned procedure without further cardiovascular testing.   The patient was advised that if she develops new symptoms prior to surgery to contact our office to arrange for a follow-up visit, and she verbalized understanding.  I will route this recommendation to the requesting party via Epic fax function and remove from pre-op pool. Please call with questions.  She will need to hold Xarelto for 2 days prior to the surgery and restart as soon as possible afterward.   [place], [place] 08/03/2020, 4:04 PM

## 2020-08-03 NOTE — Telephone Encounter (Signed)
Clinical pharmacist to review Xarelto 

## 2020-08-03 NOTE — Telephone Encounter (Signed)
Patient with diagnosis of afib on Xarelto for anticoagulation.    Procedure: colonoscopy Date of procedure: 09/15/20  CHA2DS2-VASc Score = 4  This indicates a 4.8% annual risk of stroke. The patient's score is based upon: CHF History: 0 HTN History: 1 Diabetes History: 1 Stroke History: 0 Vascular Disease History: 0 Age Score: 1 Gender Score: 1   CrCl 75m/min using adjusted body weight due to morbid obesity Platelet count 108K  Per office protocol, patient can hold Xarelto for 2 days prior to procedure.

## 2020-08-03 NOTE — Progress Notes (Signed)
HPI: FU atrial fibrillation. Stress echocardiogram performed in September of 2011 was interpreted as normal. There was mention that LV function may not have improved as much as expected and if clinical suspicion high consider alternative imaging. Lexiscan Myoview in November of 2011 showed normal perfusion. Study not gated. CardioNet showed sinus with PVCs. Had EGD in November 2016 and developed atrial fibrillation. Placed on amiodarone and xarelto. Echocardiogram November 2016 showed normal LV function, moderate left ventricular hypertrophy, grade 1 diastolic dysfunction and mild left atrial enlargement. TSH November 2016 normal. Amiodarone DCed at previousov. Since she was last seen,she denies dyspnea, chest pain, palpitations or syncope.  Current Outpatient Medications  Medication Sig Dispense Refill   ACCU-CHEK GUIDE test strip USE TO CHECK BLOOD GLUCOSE UP TO 6 TIMES DAILY AS DIRECTED 200 strip 1   Accu-Chek Softclix Lancets lancets USE TO CHECK BLOOD GLUCOSE UP TO 6 TIMES DAILY AS DIRECTED 200 each 1   acetaminophen (TYLENOL) 500 MG tablet Take 1,000 mg by mouth every 6 (six) hours as needed for moderate pain or headache.      blood glucose meter kit and supplies KIT Use up to six times daily as directed. DX. E11.9 1 each 0   citalopram (CELEXA) 20 MG tablet TAKE 1 TABLET BY MOUTH EVERY DAY 90 tablet 0   colestipol (COLESTID) 1 g tablet Take 1 tablet (1 g total) by mouth 2 (two) times daily. 60 tablet 3   Cyanocobalamin (B-12) 5000 MCG CAPS Take 5,000 mcg by mouth daily.     esomeprazole (NEXIUM) 20 MG capsule Take 20 mg by mouth daily at 12 noon.     ferrous sulfate 325 (65 FE) MG tablet Take 1 tablet (325 mg total) by mouth 2 (two) times daily with a meal.     furosemide (LASIX) 40 MG tablet TAKE 1 TABLET BY MOUTH EVERY DAY 90 tablet 1   gabapentin (NEURONTIN) 600 MG tablet 1.5 tabs po bid (Patient taking differently: Take 900 mg by mouth 2 (two) times daily. ) 270 tablet  3   inFLIXimab (REMICADE) 100 MG injection Inject 100 mg into the vein every 8 (eight) weeks.      Insulin Pen Needle (B-D UF III MINI PEN NEEDLES) 31G X 5 MM MISC USE TO INJECT INSULINS EQUAL TO 6 TIMES DAILY. 600 each 5   insulin regular human CONCENTRATED (HUMULIN R U-500 KWIKPEN) 500 UNIT/ML kwikpen 123 Units at BF, 128 Units at lunch, and 123 Units at supper.  Take an additional 20 Units at bedtime if glucose is >200. 60 mL 2   KLOR-CON M10 10 MEQ tablet TAKE 1 TABLET BY MOUTH EVERY DAY 90 tablet 1   lactase (LACTAID) 3000 UNITS tablet Take 3,000 Units by mouth as needed (when eating foods containing dairy).      loperamide (IMODIUM) 2 MG capsule Take 2-4 mg by mouth as needed for diarrhea or loose stools.  30 capsule    meclizine (ANTIVERT) 25 MG tablet Take 1 tablet (25 mg total) by mouth 3 (three) times daily as needed for dizziness or nausea. 90 tablet 0   metoprolol succinate (TOPROL-XL) 100 MG 24 hr tablet TAKE 1 TABLET BY MOUTH 2 TIMES DAILY. TAKE WITH OR IMMEDIATELY FOLLOWING A MEAL 180 tablet 0   Multiple Vitamin (MULTIVITAMIN WITH MINERALS) TABS tablet Take 1 tablet by mouth daily.     TRULICITY 1.5 CN/4.7SJ SOPN INJECT UNDER SKIN 1.5 MG WEEKLY 12 pen 3   XARELTO 20 MG TABS tablet  TAKE 1 TABLET (20 MG TOTAL) BY MOUTH DAILY WITH SUPPER. 90 tablet 1   No current facility-administered medications for this visit.     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 ? to get liver bx if she gets bariatric surgery? Mild portal hypertensive gastropathy on EGD 08/2019.   Fibromyalgia    GERD    History of thrombocytopenia 12/2011   Hyperlipidemia    Intolerant of statins   HYPERTENSION    IBS (irritable bowel syndrome)    -D.  Good response to bentyl and imodium as of 06/2018 GI f/u.   IDA (iron  deficiency anemia) 12/2018   Possibly inadequate absorption secondary to chronic/long term PPI therapy. No overt GI bleding. Endoscopic w/u including givens unrevealing. Dr. Laural [name] suspects poor iron absorption.  Oral iron restarted 12/2019.     IDDM (insulin dependent diabetes mellitus)    with DPN (managed by Dr. Cruzita Lederer but then in 2018 pt preferred to have me manage for her convenience)   Limited mobility    Requires a walker for arthritic pain, widespread musculoskeletal pain, and neuropathic pain.   Morbid obesity ([place])    As of 11/2018, pt considering sleeve gastectomy vs bipass as of eval by Dr. De Burrs considering as of 12/2019.   Nonalcoholic steatohepatitis    Viral Hep screens NEG.  CT 2015.  Transaminasemia.  U/S 07/2018 showed early changes of cirrhosis.   OSA (obstructive sleep apnea) 09/14/2015   sleep study 09/07/15: severe obstructive sleep apnea with an AHI of 72 and SaO2 low of 75%.>refer to sleep med for eval and tx options   Osteoarthritis    hips, shoulders, knees   PAF (paroxysmal atrial fibrillation) ([place])    One documented episode (after getting EGD 2016).  Was on amiodarone x 3 mo.  Rate control with metoprolol + anticoag with xarelto.    Recurrent epistaxis    Granuloma in L nare cauterized by ENT 04/2020. Another cautery 06/2020   Small fiber neuropathy    Due to DM.  Symmetric hands and feet tingling/numbness.   Ulcerative colitis ([place])    Remicade infusion Q 8 weeks: in clinical and endoscopic remission as of 12/2018 GI f/u.  06/11/19 rpt colonoscopy->cecal and ascending colon colitis.    Past Surgical History:  Procedure Laterality Date   ABDOMINAL HYSTERECTOMY  1980   Paps no longer indicated.   BACTERIAL OVERGROWTH TEST N/A 07/13/2015   Procedure: BACTERIAL OVERGROWTH TEST;  Surgeon: Rogene Houston, MD;  [facility];  Service: Endoscopy;  Laterality: N/A;  730     BILATERAL SALPINGOOPHORECTOMY  02/10/2001   BIOPSY  06/11/2019    Procedure: BIOPSY;  Surgeon: Rogene Houston, MD;  [facility];  Service: Endoscopy;;  colon   BIOPSY  09/27/2019   Procedure: BIOPSY;  Surgeon: Rogene Houston, MD;  [facility];  Service: Endoscopy;;  gastric duodenal   BREAST REDUCTION SURGERY  1994   bilat   CARDIOVASCULAR STRESS TEST  07/2010   Lexiscan myoview: normal   CARPAL TUNNEL RELEASE Right 1996   CARPAL TUNNEL RELEASE Left 03/21/2003   CARPAL TUNNEL RELEASE Right 05/04/2013   Procedure: CARPAL TUNNEL RELEASE;  Surgeon: Cammie Sickle., MD;  Location: [place];  Service: Orthopedics;  Laterality: Right;   CARPAL TUNNEL RELEASE  Left 09/21/2013   Procedure: LEFT CARPAL TUNNEL RELEASE;  Surgeon: Cammie Sickle., MD;  Location: [place];  Service: Orthopedics;  Laterality: Left;   CHOLECYSTECTOMY     COLONOSCOPY WITH PROPOFOL N/A 08/04/2015   Colitis in remission.  No polyps.  Procedure: COLONOSCOPY WITH PROPOFOL;  Surgeon: Rogene Houston, MD;  [facility];  Service: Endoscopy;  Laterality: N/A;  cecum time in  0820   time out  0827    total time 7 minutes   COLONOSCOPY WITH PROPOFOL N/A 06/11/2019   cecal and ascending colon colitis.  Procedure: COLONOSCOPY WITH PROPOFOL;  Surgeon: Rogene Houston, MD;  [facility];  Service: Endoscopy;  Laterality: N/A;  730a   ESOPHAGEAL DILATION N/A 08/04/2015   Procedure: ESOPHAGEAL DILATION;  Surgeon: Rogene Houston, MD;  [facility];  Service: Endoscopy;  Laterality: N/AKelvin Cellar, no mucousal disruption   ESOPHAGOGASTRODUODENOSCOPY  09/27/2019   Performed for IDA.  Esoph dilation was done but no stricture present.  Mild portal hypertensive gastropathy, o/w normal.  Duodenal bx NEG.  h pylori neg.   ESOPHAGOGASTRODUODENOSCOPY (EGD) WITH ESOPHAGEAL DILATION  12/02/2005   ESOPHAGOGASTRODUODENOSCOPY (EGD) WITH PROPOFOL N/A 08/04/2015   Procedure: ESOPHAGOGASTRODUODENOSCOPY (EGD) WITH PROPOFOL;   Surgeon: Rogene Houston, MD;  [facility];  Service: Endoscopy;  Laterality: N/A;  procedure 1   ESOPHAGOGASTRODUODENOSCOPY (EGD) WITH PROPOFOL N/A 09/27/2019   Procedure: ESOPHAGOGASTRODUODENOSCOPY (EGD) WITH PROPOFOL;  Surgeon: Rogene Houston, MD;  [facility];  Service: Endoscopy;  Laterality: N/A;  12:10   FLEXIBLE SIGMOIDOSCOPY  01/17/2012   Procedure: FLEXIBLE SIGMOIDOSCOPY;  Surgeon: Rogene Houston, MD;  [facility];  Service: Endoscopy;  Laterality: N/A;   GIVENS CAPSULE STUDY N/A 08/03/2019   Procedure: GIVENS CAPSULE STUDY (performed for IDA)->some food debris in stomach and small amount of blood.  Surgeon: Rogene Houston, MD;  [facility];  Service: Endoscopy;  Laterality: N/A;  730AM   HEMILAMINOTOMY LUMBAR SPINE Bilateral 09/07/1999   L4-5   KNEE ARTHROSCOPY Right 01/1999; 10/2000   LYSIS OF ADHESION  02/10/2001   MALONEY DILATION  09/27/2019   Procedure: Venia Minks DILATION;  Surgeon: Rogene Houston, MD;  [facility];  Service: Endoscopy;;   [place]; 09/12/2006   TARSAL TUNNEL RELEASE  2002   TRANSTHORACIC ECHOCARDIOGRAM  08/04/2015   EF 60-65%, normal wall motion, mild LVH, mild LA dilation, grd I DD.   TUMOR EXCISION Left 03/21/2003   dorsal 1st web space (hand)   URETEROLYSIS Right 02/10/2001    Social History   Socioeconomic History   Marital status: Married    Spouse name: Not on file   Number of children: Not on file   Years of education: Not on file   Highest education level: Not on file  Occupational History   Occupation: Retired  Tobacco Use   Smoking status: Never Smoker   Smokeless tobacco: Never Used  Scientific laboratory technician Use: Never used  Substance and Sexual Activity   Alcohol use: Yes    Comment: ocassionally   Drug use: No   Sexual activity: Not Currently    Partners: Male    Birth control/protection: Surgical    Comment: hysterectomy  Other Topics Concern    Not on file  Social History Narrative   She lives with husband in [place] home.  They have one grown son and 2 grandchildren.   She is  retired [employer].   Highest of level education:  Some college.   Never smoker.   Alcohol: rare.   Social Determinants of Health   Financial Resource Strain:    Difficulty of Paying Living Expenses: Not on file  Food Insecurity:    Worried About Charity fundraiser in the Last Year: Not on file   [employer] of Food in the Last Year: Not on file  Transportation Needs:    Lack of Transportation (Medical): Not on file   Lack of Transportation (Non-Medical): Not on file  Physical Activity:    Days of Exercise per Week: Not on file   Minutes of Exercise per Session: Not on file  Stress:    Feeling of Stress : Not on file  Social Connections:    Frequency of Communication with Friends and Family: Not on file   Frequency of Social Gatherings with Friends and Family: Not on file   Attends Religious Services: Not on file   Active Member of Clubs or Organizations: Not on file   Attends Archivist Meetings: Not on file   Marital Status: Not on file  Intimate Partner Violence:    Fear of Current or Ex-Partner: Not on file   Emotionally Abused: Not on file   Physically Abused: Not on file   Sexually Abused: Not on file    Family History  Problem Relation Age of Onset   Diabetes Mother    Hypertension Mother    Heart attack Father        Mid 26's   Heart disease Father    Lung disease Father        spot on lung; had lung surgery   Alcohol abuse Other    Hypertension Son    Diabetes Son    Emphysema Maternal Grandfather    Asthma Maternal Grandfather    Colon cancer Paternal Grandfather    Stomach cancer Paternal Grandmother    Neuropathy Neg Hx     ROS: no fevers or chills, productive cough, hemoptysis, dysphasia, odynophagia, melena, hematochezia, dysuria, hematuria, rash, seizure activity,  orthopnea, PND, pedal edema, claudication. Remaining systems are negative.  Physical Exam: Well-developed well-nourished in no acute distress.  Skin is warm and dry.  HEENT is normal.  Neck is supple.  Chest is clear to auscultation with normal expansion.  Cardiovascular exam is regular rate and rhythm.  Abdominal exam nontender or distended. No masses palpated. Extremities show trace edema. neuro grossly intact  ECG-sinus rhythm with PACs, left axis deviation, anterior infarct.  Personally reviewed  A/P  1 paroxysmal atrial fibrillation-patient is in sinus rhythm today.  Continue metoprolol for rate control if atrial fibrillation recurs.  Continue Xarelto.   2 hypertension-patient's blood pressure is controlled.  Continue present medical regimen and follow.  3 hyperlipidemia-followed by primary care.  Kirk Ruths, MD

## 2020-08-03 NOTE — Telephone Encounter (Signed)
08/03/20  [name] Oct 03, 1945   Request for pharmacy clearance to hold medication prior to procedure:    Procedure to be performed? colonoscop   Procedure Date: 09/15/20    Medications that need to be held prior to procedure and how long?   Xarelto 2 days prior    Name of physician performing procedure? Dr Jenetta Downer     Anesthesia type? Monitored (propofol)   ______ Yes, the above patient can stop the medicine indicated above   ______ No, the above cannot stop the medicine indicated above    ______________________________     ______________________         Doctor Signature             Date   Please fax this back at [phone]. If you have questions I can be reached at [phone].  Thank you  [facility]

## 2020-08-07 ENCOUNTER — Ambulatory Visit (HOSPITAL_COMMUNITY)
Admission: RE | Admit: 2020-08-07 | Discharge: 2020-08-07 | Disposition: A | Discharge status: Home / Self Care | Payer: Medicare PPO | Source: Ambulatory Visit | Attending: Gastroenterology | Admitting: Gastroenterology

## 2020-08-07 ENCOUNTER — Other Ambulatory Visit (INDEPENDENT_AMBULATORY_CARE_PROVIDER_SITE_OTHER): Payer: Self-pay | Admitting: Gastroenterology

## 2020-08-07 ENCOUNTER — Other Ambulatory Visit: Payer: Self-pay | Admitting: Family Medicine

## 2020-08-07 ENCOUNTER — Other Ambulatory Visit: Payer: Self-pay

## 2020-08-07 DIAGNOSIS — K769 Liver disease, unspecified: Secondary | ICD-10-CM

## 2020-08-07 DIAGNOSIS — K7689 Other specified diseases of liver: Secondary | ICD-10-CM | POA: Diagnosis not present

## 2020-08-07 DIAGNOSIS — R7989 Other specified abnormal findings of blood chemistry: Secondary | ICD-10-CM | POA: Diagnosis not present

## 2020-08-10 ENCOUNTER — Other Ambulatory Visit: Payer: Self-pay

## 2020-08-10 ENCOUNTER — Ambulatory Visit: Payer: Medicare PPO | Admitting: Cardiology

## 2020-08-10 ENCOUNTER — Encounter: Payer: Self-pay | Admitting: Cardiology

## 2020-08-10 VITALS — BP 123/74 | HR 71 | Temp 97.5°F | Ht 62.0 in | Wt 231.2 lb

## 2020-08-10 DIAGNOSIS — I10 Essential (primary) hypertension: Secondary | ICD-10-CM

## 2020-08-10 DIAGNOSIS — E7800 Pure hypercholesterolemia, unspecified: Secondary | ICD-10-CM | POA: Diagnosis not present

## 2020-08-10 DIAGNOSIS — I480 Paroxysmal atrial fibrillation: Secondary | ICD-10-CM | POA: Diagnosis not present

## 2020-08-10 NOTE — Patient Instructions (Signed)

## 2020-08-14 ENCOUNTER — Telehealth: Payer: Self-pay | Admitting: Family Medicine

## 2020-08-14 DIAGNOSIS — R159 Full incontinence of feces: Secondary | ICD-10-CM | POA: Diagnosis not present

## 2020-08-15 ENCOUNTER — Other Ambulatory Visit: Payer: Self-pay | Admitting: Family Medicine

## 2020-08-16 DIAGNOSIS — K5180 Other ulcerative colitis without complications: Secondary | ICD-10-CM | POA: Diagnosis not present

## 2020-08-18 ENCOUNTER — Encounter: Payer: Self-pay | Admitting: Family Medicine

## 2020-08-18 ENCOUNTER — Ambulatory Visit: Payer: Medicare PPO | Admitting: Family Medicine

## 2020-08-18 ENCOUNTER — Other Ambulatory Visit: Payer: Self-pay

## 2020-08-18 VITALS — BP 113/64 | HR 71 | Temp 97.5°F | Resp 16 | Ht 60.75 in | Wt 236.2 lb

## 2020-08-18 DIAGNOSIS — Z23 Encounter for immunization: Secondary | ICD-10-CM

## 2020-08-18 DIAGNOSIS — Z7901 Long term (current) use of anticoagulants: Secondary | ICD-10-CM | POA: Diagnosis not present

## 2020-08-18 DIAGNOSIS — D508 Other iron deficiency anemias: Secondary | ICD-10-CM

## 2020-08-18 DIAGNOSIS — I480 Paroxysmal atrial fibrillation: Secondary | ICD-10-CM | POA: Diagnosis not present

## 2020-08-18 DIAGNOSIS — I10 Essential (primary) hypertension: Secondary | ICD-10-CM | POA: Diagnosis not present

## 2020-08-18 DIAGNOSIS — K760 Fatty (change of) liver, not elsewhere classified: Secondary | ICD-10-CM | POA: Diagnosis not present

## 2020-08-18 DIAGNOSIS — E7800 Pure hypercholesterolemia, unspecified: Secondary | ICD-10-CM | POA: Diagnosis not present

## 2020-08-18 DIAGNOSIS — K51019 Ulcerative (chronic) pancolitis with unspecified complications: Secondary | ICD-10-CM | POA: Diagnosis not present

## 2020-08-18 DIAGNOSIS — Z0000 Encounter for general adult medical examination without abnormal findings: Secondary | ICD-10-CM

## 2020-08-18 DIAGNOSIS — E118 Type 2 diabetes mellitus with unspecified complications: Secondary | ICD-10-CM

## 2020-08-18 LAB — COMPREHENSIVE METABOLIC PANEL
ALT: 38 U/L — ABNORMAL HIGH (ref 0–35)
AST: 46 U/L — ABNORMAL HIGH (ref 0–37)
Albumin: 3.2 g/dL — ABNORMAL LOW (ref 3.5–5.2)
Alkaline Phosphatase: 73 U/L (ref 39–117)
BUN: 11 mg/dL (ref 6–23)
CO2: 29 mEq/L (ref 19–32)
Calcium: 8.4 mg/dL (ref 8.4–10.5)
Chloride: 102 mEq/L (ref 96–112)
Creatinine, Ser: 0.58 mg/dL (ref 0.40–1.20)
GFR: 90.05 mL/min (ref >60.00)
Glucose, Bld: 248 mg/dL — ABNORMAL HIGH (ref 70–99)
Potassium: 4.3 mEq/L (ref 3.5–5.1)
Sodium: 138 mEq/L (ref 135–145)
Total Bilirubin: 0.9 mg/dL (ref 0.2–1.2)
Total Protein: 6.4 g/dL (ref 6.0–8.3)

## 2020-08-18 LAB — MICROALBUMIN / CREATININE URINE RATIO
Creatinine,U: 101.1 mg/dL
Microalb Creat Ratio: 0.8 mg/g (ref 0.0–30.0)
Microalb, Ur: 0.8 mg/dL (ref 0.0–1.9)

## 2020-08-18 LAB — CBC WITH DIFFERENTIAL/PLATELET
Basophils Absolute: 0 10*3/uL (ref 0.0–0.1)
Basophils Relative: 0.5 % (ref 0.0–3.0)
Eosinophils Absolute: 0.1 10*3/uL (ref 0.0–0.7)
Eosinophils Relative: 1.3 % (ref 0.0–5.0)
HCT: 36.6 % (ref 36.0–46.0)
Hemoglobin: 12.5 g/dL (ref 12.0–15.0)
Lymphocytes Relative: 47.3 % — ABNORMAL HIGH (ref 12.0–46.0)
Lymphs Abs: 1.9 10*3/uL (ref 0.7–4.0)
MCHC: 34.2 g/dL (ref 30.0–36.0)
MCV: 99.1 fl (ref 78.0–100.0)
Monocytes Absolute: 0.4 10*3/uL (ref 0.1–1.0)
Monocytes Relative: 8.9 % (ref 3.0–12.0)
Neutro Abs: 1.7 10*3/uL (ref 1.4–7.7)
Neutrophils Relative %: 42 % — ABNORMAL LOW (ref 43.0–77.0)
Platelets: 92 10*3/uL — ABNORMAL LOW (ref 150.0–400.0)
RBC: 3.7 Mil/uL — ABNORMAL LOW (ref 3.87–5.11)
RDW: 13.8 % (ref 11.5–15.5)
WBC: 4.1 10*3/uL (ref 4.0–10.5)

## 2020-08-18 LAB — HEMOGLOBIN A1C: Hgb A1c MFr Bld: 7 % — ABNORMAL HIGH (ref 4.6–6.5)

## 2020-08-18 LAB — TSH: TSH: 1.09 u[IU]/mL (ref 0.35–4.50)

## 2020-08-18 MED ORDER — METOPROLOL SUCCINATE ER 100 MG PO TB24
ORAL_TABLET | ORAL | 0 refills | Status: DC
Start: 2020-08-18 — End: 2020-09-21

## 2020-08-18 MED ORDER — HUMULIN R U-500 KWIKPEN 500 UNIT/ML ~~LOC~~ SOPN
PEN_INJECTOR | SUBCUTANEOUS | 2 refills | Status: DC
Start: 2020-08-18 — End: 2020-09-21

## 2020-08-18 NOTE — Progress Notes (Signed)
OFFICE VISIT  08/20/2020  CC:  Chief Complaint  Patient presents with  . Follow-up    RCI, pt is not fasting. Blood sugar was 59    HPI:    Patient is a 74 y.o. Caucasian female who presents for 4 mo f/u DM 2, HTN, HLD, IDA. A/P as of last visit: "1) Hypotension: minimally symptomatic this morning,Felt back to normal by the end of visit. ? Cognitive slowing early in appt from marginally low/"relative" hypoglycemia. No sign of acute blood loss, but having chronic blood loss via nosebleeds. Her first low bp was this am, so I don't think the blood loss chronically would have led to sudden bp drop this morning. She did take her metop this morning already. Plan/instructions: "Do not take your metoprolol dose later today. Check bp and heart rate 2-3 times per day over the next 1 wk and write these numbers down. If bp gets back above 130 on top then restart metoprolol ONE TAB ONCE A DAY. If bp remains over 130 on top after starting back on metoprolol then increase this med to 1 tab twice a day. CBC and lytes/cr today.  2) DM 2: erratic gluc's as per her usual. No clearly discernable pattern that would be predictable enough to base a sensible dosing change recommendation on.  Will go ahead and simply try to avoid hypoglycemia by decreasing U-500 dosing at each dose: AM dec to 123, lunch dec to 128, supper dec to 123, and dec to 20 U for hs prn gluc >200. HbA1c today. Lytes/cr today.  Fortunately, renal function has consistently been normal.  3) IDA, hx of suspected iron malabsorption, hx of neg w/ou in the past year as far as endoscopy/givens study. Has been on PO iron x 3 mo. Has had melena-type stools but not clear whether this if from upper GIB or just her chronic stool consistency + iron discoloration. CBC and iron studies today. If Hb dropped signif compared to last check then stop xarelto (esp in light of low bp today and ongoing problem with recurrent nose bleeds).  If Hb stable  or improved then we'll keep her on xarelto.  4) Epistaxis, recurrent--ever since being on xarelto this has been problematic for her. We'll ask ENT to re-eval the lesion on nasal septum to see if cauterization is an option.  5) NAFLD: hepatic panel today. Ongoing investigation as to possible cirrhosis per Dr Laural [name] (possible future liver bx vs ultrasound elastography)."  INTERIM HX: F/u with cardiology 8d/a for her PAF.  She was in NSR at that time.  No changes.  She is on rate control and xarelto.  Has plans with GI to get colonoscopy in a month. Takes one iron tab daily.  Glucoses pretty erratic as per her usual, although her pattern is more discernable when looking at her glucose log than it usually is.  Consisent prob with hypoglycemia in AM, particularly when she gives 20 U prn insulin dose if gluc >200 hs.  Otherwise glucoses pretty consistently upper 100s to mid 200s. Diet and activity level unchanged.  BPs consistently avg low 140s/80 or so, NO LOW BPs. Highests in 150s/80s.  Nosebleeds less frequent and less severe since I last saw her. She got cautery x2 at ENT. Using nasal moisturizer regularly.  Not using CPAP now.  ROS: no fevers, no CP, no SOB, no wheezing, no cough, no dizziness, no HAs, no rashes, no melena/hematochezia.  No polyuria or polydipsia.  No myalgias or arthralgias.  No focal  weakness, paresthesias, or tremors.  No acute vision or hearing abnormalities. No n/v/d or abd pain.  No palpitations.    Past Medical History:  Diagnosis Date  . Anxiety   . 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 ? to get liver bx if she gets bariatric surgery? Mild portal hypertensive gastropathy on EGD 08/2019.  Marland Kitchen Fibromyalgia   . GERD   . History of thrombocytopenia 12/2011  . Hyperlipidemia    Intolerant of statins  . HYPERTENSION   . IBS (irritable bowel  syndrome)    -D.  Good response to bentyl and imodium as of 06/2018 GI f/u.  . IDA (iron deficiency anemia) 12/2018   Possibly inadequate absorption secondary to chronic/long term PPI therapy. No overt GI bleding. Endoscopic w/u including givens unrevealing. Dr. Laural [name] suspects poor iron absorption.  Oral iron restarted 12/2019.    Marland Kitchen IDDM (insulin dependent diabetes mellitus)    with DPN (managed by Dr. Cruzita Lederer but then in 2018 pt preferred to have me manage for her convenience)  . Limited mobility    Requires a walker for arthritic pain, widespread musculoskeletal pain, and neuropathic pain.  . Morbid obesity ([place])    As of 11/2018, pt considering sleeve gastectomy vs bipass as of eval by Dr. De Burrs considering as of 12/2019.  Marland Kitchen Nonalcoholic steatohepatitis    Viral Hep screens NEG.  CT 2015.  Transaminasemia.  U/S 07/2018 showed early changes of cirrhosis.  . OSA (obstructive sleep apnea) 09/14/2015   sleep study 09/07/15: severe obstructive sleep apnea with an AHI of 72 and SaO2 low of 75%.>refer to sleep med for eval and tx options  . Osteoarthritis    hips, shoulders, knees  . PAF (paroxysmal atrial fibrillation) ([place])    One documented episode (after getting EGD 2016).  Was on amiodarone x 3 mo.  Rate control with metoprolol + anticoag with xarelto.   . Recurrent epistaxis    Granuloma in L nare cauterized by ENT 04/2020. Another cautery 06/2020  . Small fiber neuropathy    Due to DM.  Symmetric hands and feet tingling/numbness.  Marland Kitchen Ulcerative colitis ([place])    Remicade infusion Q 8 weeks: in clinical and endoscopic remission as of 12/2018 GI f/u.  06/11/19 rpt colonoscopy->cecal and ascending colon colitis.    Past Surgical History:  Procedure Laterality Date  . ABDOMINAL HYSTERECTOMY  1980   Paps no longer indicated.  Marland Kitchen BACTERIAL OVERGROWTH TEST N/A 07/13/2015   Procedure: BACTERIAL OVERGROWTH TEST;  Surgeon: Rogene Houston, MD;  [facility];  Service: Endoscopy;   Laterality: N/A;  730    . BILATERAL SALPINGOOPHORECTOMY  02/10/2001  . BIOPSY  06/11/2019   Procedure: BIOPSY;  Surgeon: Rogene Houston, MD;  [facility];  Service: Endoscopy;;  colon  . BIOPSY  09/27/2019   Procedure: BIOPSY;  Surgeon: Rogene Houston, MD;  [facility];  Service: Endoscopy;;  gastric duodenal  . BREAST REDUCTION SURGERY  1994   bilat  . CARDIOVASCULAR STRESS TEST  07/2010   Lexiscan myoview: normal  . CARPAL TUNNEL RELEASE Right 1996  . CARPAL TUNNEL RELEASE Left 03/21/2003  . CARPAL TUNNEL RELEASE Right 05/04/2013   Procedure: CARPAL TUNNEL RELEASE;  Surgeon: Cammie Sickle., MD;  Location: [place];  Service: Orthopedics;  Laterality: Right;  . CARPAL TUNNEL RELEASE Left 09/21/2013   Procedure:  LEFT CARPAL TUNNEL RELEASE;  Surgeon: Cammie Sickle., MD;  Location: [place];  Service: Orthopedics;  Laterality: Left;  . CHOLECYSTECTOMY    . COLONOSCOPY WITH PROPOFOL N/A 08/04/2015   Colitis in remission.  No polyps.  Procedure: COLONOSCOPY WITH PROPOFOL;  Surgeon: Rogene Houston, MD;  [facility];  Service: Endoscopy;  Laterality: N/A;  cecum time in  0820   time out  0827    total time 7 minutes  . COLONOSCOPY WITH PROPOFOL N/A 06/11/2019   cecal and ascending colon colitis.  Procedure: COLONOSCOPY WITH PROPOFOL;  Surgeon: Rogene Houston, MD;  [facility];  Service: Endoscopy;  Laterality: N/A;  730a  . ESOPHAGEAL DILATION N/A 08/04/2015   Procedure: ESOPHAGEAL DILATION;  Surgeon: Rogene Houston, MD;  [facility];  Service: Endoscopy;  Laterality: N/A;  [place] 57, no mucousal disruption  . ESOPHAGOGASTRODUODENOSCOPY  09/27/2019   Performed for IDA.  Esoph dilation was done but no stricture present.  Mild portal hypertensive gastropathy, o/w normal.  Duodenal bx NEG.  h pylori neg.  Marland Kitchen ESOPHAGOGASTRODUODENOSCOPY (EGD) WITH ESOPHAGEAL DILATION  12/02/2005  . ESOPHAGOGASTRODUODENOSCOPY  (EGD) WITH PROPOFOL N/A 08/04/2015   Procedure: ESOPHAGOGASTRODUODENOSCOPY (EGD) WITH PROPOFOL;  Surgeon: Rogene Houston, MD;  [facility];  Service: Endoscopy;  Laterality: N/A;  procedure 1  . ESOPHAGOGASTRODUODENOSCOPY (EGD) WITH PROPOFOL N/A 09/27/2019   Procedure: ESOPHAGOGASTRODUODENOSCOPY (EGD) WITH PROPOFOL;  Surgeon: Rogene Houston, MD;  [facility];  Service: Endoscopy;  Laterality: N/A;  12:10  . FLEXIBLE SIGMOIDOSCOPY  01/17/2012   Procedure: FLEXIBLE SIGMOIDOSCOPY;  Surgeon: Rogene Houston, MD;  [facility];  Service: Endoscopy;  Laterality: N/A;  . GIVENS CAPSULE STUDY N/A 08/03/2019   Procedure: GIVENS CAPSULE STUDY (performed for IDA)->some food debris in stomach and small amount of blood.  Surgeon: Rogene Houston, MD;  [facility];  Service: Endoscopy;  Laterality: N/A;  730AM  . HEMILAMINOTOMY LUMBAR SPINE Bilateral 09/07/1999   L4-5  . KNEE ARTHROSCOPY Right 01/1999; 10/2000  . LYSIS OF ADHESION  02/10/2001  . MALONEY DILATION  09/27/2019   Procedure: MALONEY DILATION;  Surgeon: Rogene Houston, MD;  [facility];  Service: Endoscopy;;  . [place]; 09/12/2006  . TARSAL TUNNEL RELEASE  2002  . TRANSTHORACIC ECHOCARDIOGRAM  08/04/2015   EF 60-65%, normal wall motion, mild LVH, mild LA dilation, grd I DD.  Marland Kitchen TUMOR EXCISION Left 03/21/2003   dorsal 1st web space (hand)  . URETEROLYSIS Right 02/10/2001    Outpatient Medications Prior to Visit  Medication Sig Dispense Refill  . ACCU-CHEK GUIDE test strip USE TO CHECK BLOOD GLUCOSE UP TO 6 TIMES DAILY AS DIRECTED 200 strip 1  . Accu-Chek Softclix Lancets lancets USE TO CHECK BLOOD GLUCOSE UP TO 6 TIMES DAILY AS DIRECTED 200 each 1  . blood glucose meter kit and supplies KIT Use up to six times daily as directed. DX. E11.9 1 each 0  . citalopram (CELEXA) 20 MG tablet TAKE 1 TABLET BY MOUTH EVERY DAY 90 tablet 0  . colestipol (COLESTID) 1 g tablet Take 1 tablet (1  g total) by mouth 2 (two) times daily. (Patient taking differently: Take 1 g by mouth daily. ) 60 tablet 3  . Cyanocobalamin (B-12) 5000 MCG CAPS Take 5,000 mcg by mouth daily.    Marland Kitchen esomeprazole (NEXIUM) 20 MG capsule Take 20 mg by mouth daily at 12 noon.    Marland Kitchen  ferrous sulfate 325 (65 FE) MG tablet Take 1 tablet (325 mg total) by mouth 2 (two) times daily with a meal.    . furosemide (LASIX) 40 MG tablet TAKE 1 TABLET BY MOUTH EVERY DAY 90 tablet 1  . gabapentin (NEURONTIN) 600 MG tablet 1.5 tabs po bid (Patient taking differently: Take 900 mg by mouth 2 (two) times daily. ) 270 tablet 3  . inFLIXimab (REMICADE) 100 MG injection Inject 100 mg into the vein every 8 (eight) weeks.     . Insulin Pen Needle (B-D UF III MINI PEN NEEDLES) 31G X 5 MM MISC USE TO INJECT INSULINS EQUAL TO 6 TIMES DAILY. 600 each 5  . KLOR-CON M10 10 MEQ tablet TAKE 1 TABLET BY MOUTH EVERY DAY 90 tablet 1  . lactase (LACTAID) 3000 UNITS tablet Take 3,000 Units by mouth as needed (when eating foods containing dairy).     Marland Kitchen loperamide (IMODIUM) 2 MG capsule Take 2-4 mg by mouth as needed for diarrhea or loose stools.  30 capsule   . meclizine (ANTIVERT) 25 MG tablet Take 1 tablet (25 mg total) by mouth 3 (three) times daily as needed for dizziness or nausea. 90 tablet 0  . Multiple Vitamin (MULTIVITAMIN WITH MINERALS) TABS tablet Take 1 tablet by mouth daily.    . TRULICITY 1.5 RR/1.1AF SOPN INJECT UNDER SKIN 1.5 MG WEEKLY 12 pen 3  . XARELTO 20 MG TABS tablet TAKE 1 TABLET (20 MG TOTAL) BY MOUTH DAILY WITH SUPPER. 90 tablet 1  . insulin regular human CONCENTRATED (HUMULIN R U-500 KWIKPEN) 500 UNIT/ML kwikpen 123 Units at BF, 128 Units at lunch, and 123 Units at supper.  Take an additional 20 Units at bedtime if glucose is >200. (Patient taking differently: 125 Units at BF, 135 Units at lunch, and 125 Units at supper.  Take an additional 20 Units at bedtime if glucose is >200.) 60 mL 2  . metoprolol succinate (TOPROL-XL) 100 MG  24 hr tablet TAKE 1 TABLET BY MOUTH 2 TIMES DAILY. TAKE WITH OR IMMEDIATELY FOLLOWING A MEAL 180 tablet 0  . acetaminophen (TYLENOL) 500 MG tablet Take 1,000 mg by mouth every 6 (six) hours as needed for moderate pain or headache.  (Patient not taking: Reported on 08/18/2020)     No facility-administered medications prior to visit.    Allergies  Allergen Reactions  . Omeprazole Anaphylaxis and Swelling    SWELLING OF TONGUE AND THROAT  . Actos [Pioglitazone] Other (See Comments)    Weight gain  . Benzocaine-Menthol Swelling    SWELLING OF MOUTH  . Colesevelam Other (See Comments)    GI UPSET  . Flagyl [Metronidazole Hcl] Other (See Comments)    DIAPHORESIS  . Metformin And Related Diarrhea  . Shrimp [Shellfish Allergy] Itching    OF THROAT AND EARS  . Statins Palpitations  . Desipramine Hcl Itching, Nausea Only and Other (See Comments)    "swimmy" headed, ears itched   . Hydromorphone Itching  . Jardiance [Empagliflozin] Other (See Comments)    weakness  . Lactose Intolerance (Gi) Diarrhea    Gas, bloating  . Adhesive [Tape] Other (See Comments)    SKIN IRRITATION AND BRUISING  . Nisoldipine Itching  . Percocet [Oxycodone-Acetaminophen] Itching    ROS As per HPI  PE: Vitals with BMI 08/18/2020 08/10/2020 08/02/2020  Height 5' 0.75" _0  5' 0.75"  Weight 236 lbs 3 oz 231 lbs 3 oz 234 lbs 8 oz  BMI 45 79.03 83.33  Systolic 832 919  932  Diastolic 64 74 79  Pulse 71 71 72     Gen: Alert, well appearing.  Patient is oriented to person, place, time, and situation. AFFECT: pleasant, lucid thought and speech. No further exam today.  LABS:  Lab Results  Component Value Date   TSH 1.09 08/18/2020   Lab Results  Component Value Date   WBC 4.1 08/18/2020   HGB 12.5 08/18/2020   HCT 36.6 08/18/2020   MCV 99.1 08/18/2020   PLT 92.0 (L) 08/18/2020   Lab Results  Component Value Date   IRON 111 08/18/2020   TIBC 268 08/18/2020   FERRITIN 86 08/18/2020   Lab  Results  Component Value Date   VITAMINB12 >2,000 (H) 01/26/2019    Lab Results  Component Value Date   CREATININE 0.58 08/18/2020   BUN 11 08/18/2020   NA 138 08/18/2020   K 4.3 08/18/2020   CL 102 08/18/2020   CO2 29 08/18/2020   Lab Results  Component Value Date   ALT 38 (H) 08/18/2020   AST 46 (H) 08/18/2020   ALKPHOS 73 08/18/2020   BILITOT 0.9 08/18/2020   Lab Results  Component Value Date   CHOL 202 (H) 04/18/2020   Lab Results  Component Value Date   HDL 41.50 04/18/2020   Lab Results  Component Value Date   LDLCALC 139 (H) 04/18/2020   Lab Results  Component Value Date   TRIG 108.0 04/18/2020   Lab Results  Component Value Date   CHOLHDL 5 04/18/2020   Lab Results  Component Value Date   HGBA1C 7.0 (H) 08/18/2020   IMPRESSION AND PLAN:  1) DM 2, not well controlled.  Hba1c and urine microalb/cr today. Instructions: Increase your U-500 insulin to 130 Units at breakfast, 140 Units at lunch, and 130 Units at supper. Decrease your U-500 at bedtime to 10 Units and take this ONLY IF YOUR GLUCOSE IS >200.  2) HTN, not well controlled. Increase toprol xl to 1 and 1/2 of the 100 mg tabs once a day. I recommended she restart CPAP (she had stopped this b/c of worries that it was contributing to her nosebleeds) and this may help bp control some.  3) IDA; malabsorption suspected, + some contribution from chronic nosebleeds. Nosebleeds no longer an issue lately. She's been more compliant with oral iron lately. CBC w/iron studies today.  4) NAFLD: early cirrhosis changes (most recently on u/s RUQ 08/07/20), +portal HTN. On recent u/s a1.2 cm hypoechoic liver lesion was noted and this needs MRI f/u--Followed by GI. Hepatic panel today.  5) Ulcerative colitis: remicade per GI.   Chronic diarrhea, fecal incontinence episodes: ? From active colitis? GI to do colonoscopy in about a month.Marland Kitchen  6) PAF, chronic anticoagulation.  No sign of overt bleeding. Cont  toprol xl for rate control, xarelto anticoag. Cont routine cardiology f/u. CBC monitoring today.  7) Preventative health care:  Shingrix rx to pharmacy. Cerv ca scr->hx of hysterectomy for benign dx, last pap normal 2012, no hx of abnormals, no further cerv ca screening indicated. She had pelvic exam 05/2020 by GYN ,Dr. Sabra Heck, no probs. Mammogram 03/2019 normal, Dr. Sabra Heck, rpt due. DEXA showed osteopenia 03/2019, per Dr. Sabra Heck, rpt 5 yrs.  An After Visit Summary was printed and given to the patient.  FOLLOW UP: Return in about 4 weeks (around 09/15/2020) for f/u DM and HTN.  Signed:  Crissie Sickles, MD           08/20/2020

## 2020-08-18 NOTE — Patient Instructions (Signed)
Increase your U-500 insulin to 130 Units at breakfast, 140 Units at lunch, and 130 Units at supper.  Decrease your U-500 at bedtime to 10 Units and take this ONLY IF YOUR GLUCOSE IS >200.

## 2020-08-19 LAB — IRON,TIBC AND FERRITIN PANEL
%SAT: 41 % (calc) (ref 16–45)
Ferritin: 86 ng/mL (ref 16–288)
Iron: 111 ug/dL (ref 45–160)
TIBC: 268 mcg/dL (calc) (ref 250–450)

## 2020-08-20 ENCOUNTER — Telehealth: Payer: Self-pay | Admitting: Family Medicine

## 2020-08-20 NOTE — Telephone Encounter (Signed)
All lab work is stable. Hba1c is 7.0%. Continue with the plan discussed at recent o/v.--thx

## 2020-08-21 ENCOUNTER — Other Ambulatory Visit: Payer: Self-pay

## 2020-08-21 ENCOUNTER — Ambulatory Visit (HOSPITAL_COMMUNITY)
Admission: RE | Admit: 2020-08-21 | Discharge: 2020-08-21 | Disposition: A | Discharge status: Home / Self Care | Payer: Medicare PPO | Source: Ambulatory Visit | Attending: Gastroenterology | Admitting: Gastroenterology

## 2020-08-21 DIAGNOSIS — I700 Atherosclerosis of aorta: Secondary | ICD-10-CM | POA: Diagnosis not present

## 2020-08-21 DIAGNOSIS — K760 Fatty (change of) liver, not elsewhere classified: Secondary | ICD-10-CM | POA: Diagnosis not present

## 2020-08-21 DIAGNOSIS — K769 Liver disease, unspecified: Secondary | ICD-10-CM | POA: Insufficient documentation

## 2020-08-21 DIAGNOSIS — M47814 Spondylosis without myelopathy or radiculopathy, thoracic region: Secondary | ICD-10-CM | POA: Diagnosis not present

## 2020-08-21 DIAGNOSIS — M47816 Spondylosis without myelopathy or radiculopathy, lumbar region: Secondary | ICD-10-CM | POA: Diagnosis not present

## 2020-08-21 MED ORDER — GADOBUTROL 1 MMOL/ML IV SOLN
10.0000 mL | Freq: Once | INTRAVENOUS | Status: AC | PRN
  Administered 2020-08-21: 10 mL via INTRAVENOUS

## 2020-08-21 NOTE — Telephone Encounter (Signed)
Patient advised and voiced understanding.  

## 2020-08-23 ENCOUNTER — Other Ambulatory Visit (INDEPENDENT_AMBULATORY_CARE_PROVIDER_SITE_OTHER): Payer: Self-pay | Admitting: Gastroenterology

## 2020-08-23 DIAGNOSIS — K769 Liver disease, unspecified: Secondary | ICD-10-CM

## 2020-08-23 DIAGNOSIS — K7689 Other specified diseases of liver: Secondary | ICD-10-CM

## 2020-08-23 DIAGNOSIS — K7460 Unspecified cirrhosis of liver: Secondary | ICD-10-CM

## 2020-08-29 ENCOUNTER — Other Ambulatory Visit (INDEPENDENT_AMBULATORY_CARE_PROVIDER_SITE_OTHER): Payer: Self-pay | Admitting: *Deleted

## 2020-08-30 ENCOUNTER — Other Ambulatory Visit: Payer: Self-pay | Admitting: Family Medicine

## 2020-09-12 NOTE — Patient Instructions (Signed)
[name]  09/12/2020     @PREFPERIOPPHARMACY @   Your procedure is scheduled on  09/15/2020.  Report to [facility] at  0900  A.M.  Call this number if you have problems the morning of surgery:  [phone]   Remember:  Follow the diet and prep instructions given to you by the office.                      Take these medicines the morning of surgery with A SIP OF WATER celexa, nexium, gabapentin, antivert(if needed), metoprolol. Take 65 untis of humulin R the night before your procedure.    Do not wear jewelry, make-up or nail polish.  Do not wear lotions, powders, or perfumes. Please wear deodorant and brush your teeth.  Do not shave 48 hours prior to surgery.  Men may shave face and neck.  Do not bring valuables to the hospital.  [facility] is not responsible for any belongings or valuables.  Contacts, dentures or bridgework may not be worn into surgery.  Leave your suitcase in the car.  After surgery it may be brought to your room.  For patients admitted to the hospital, discharge time will be determined by your treatment team.  Patients discharged the day of surgery will not be allowed to drive home.   Name and phone number of your driver:   family Special instructions:  DO NOT smoke the morning of your procedure.  Please read over the following fact sheets that you were given. Anesthesia Post-op Instructions and Care and Recovery After Surgery       Colonoscopy, Adult, Care After This sheet gives you information about how to care for yourself after your procedure. Your health care provider may also give you more specific instructions. If you have problems or questions, contact your health care provider. What can I expect after the procedure? After the procedure, it is common to have:  A small amount of blood in your stool for 24 hours after the procedure.  Some gas.  Mild cramping or bloating of your abdomen. Follow these instructions at  home: Eating and drinking   Drink enough fluid to keep your urine pale yellow.  Follow instructions from your health care provider about eating or drinking restrictions.  Resume your normal diet as instructed by your health care provider. Avoid heavy or fried foods that are hard to digest. Activity  Rest as told by your health care provider.  Avoid sitting for a long time without moving. Get up to take short walks every 1-2 hours. This is important to improve blood flow and breathing. Ask for help if you feel weak or unsteady.  Return to your normal activities as told by your health care provider. Ask your health care provider what activities are safe for you. Managing cramping and bloating   Try walking around when you have cramps or feel bloated.  Apply heat to your abdomen as told by your health care provider. Use the heat source that your health care provider recommends, such as a moist heat pack or a heating pad. ? Place a towel between your skin and the heat source. ? Leave the heat on for 20-30 minutes. ? Remove the heat if your skin turns bright red. This is especially important if you are unable to feel pain, heat, or cold. You may have a greater risk of getting burned. General instructions  For the first 24 hours after  the procedure: ? Do not drive or use machinery. ? Do not sign important documents. ? Do not drink alcohol. ? Do your regular daily activities at a slower pace than normal. ? Eat soft foods that are easy to digest.  Take over-the-counter and prescription medicines only as told by your health care provider.  Keep all follow-up visits as told by your health care provider. This is important. Contact a health care provider if:  You have blood in your stool 2-3 days after the procedure. Get help right away if you have:  More than a small spotting of blood in your stool.  Large blood clots in your stool.  Swelling of your abdomen.  Nausea or  vomiting.  A fever.  Increasing pain in your abdomen that is not relieved with medicine. Summary  After the procedure, it is common to have a small amount of blood in your stool. You may also have mild cramping and bloating of your abdomen.  For the first 24 hours after the procedure, do not drive or use machinery, sign important documents, or drink alcohol.  Get help right away if you have a lot of blood in your stool, nausea or vomiting, a fever, or increased pain in your abdomen. This information is not intended to replace advice given to you by your health care provider. Make sure you discuss any questions you have with your health care provider. Document Revised: 04/12/2019 Document Reviewed: 04/12/2019 Elsevier Patient Education  [place] After These instructions provide you with information about caring for yourself after your procedure. Your health care provider may also give you more specific instructions. Your treatment has been planned according to current medical practices, but problems sometimes occur. Call your health care provider if you have any problems or questions after your procedure. What can I expect after the procedure? After your procedure, you may:  Feel sleepy for several hours.  Feel clumsy and have poor balance for several hours.  Feel forgetful about what happened after the procedure.  Have poor judgment for several hours.  Feel nauseous or vomit.  Have a sore throat if you had a breathing tube during the procedure. Follow these instructions at home: For at least 24 hours after the procedure:      Have a responsible adult stay with you. It is important to have someone help care for you until you are awake and alert.  Rest as needed.  Do not: ? Participate in activities in which you could fall or become injured. ? Drive. ? Use heavy machinery. ? Drink alcohol. ? Take sleeping pills or medicines that  cause drowsiness. ? Make important decisions or sign legal documents. ? Take care of children on your own. Eating and drinking  Follow the diet that is recommended by your health care provider.  If you vomit, drink water, juice, or soup when you can drink without vomiting.  Make sure you have little or no nausea before eating solid foods. General instructions  Take over-the-counter and prescription medicines only as told by your health care provider.  If you have sleep apnea, surgery and certain medicines can increase your risk for breathing problems. Follow instructions from your health care provider about wearing your sleep device: ? Anytime you are sleeping, including during daytime naps. ? While taking prescription pain medicines, sleeping medicines, or medicines that make you drowsy.  If you smoke, do not smoke without supervision.  Keep all follow-up visits as told by  your health care provider. This is important. Contact a health care provider if:  You keep feeling nauseous or you keep vomiting.  You feel light-headed.  You develop a rash.  You have a fever. Get help right away if:  You have trouble breathing. Summary  For several hours after your procedure, you may feel sleepy and have poor judgment.  Have a responsible adult stay with you for at least 24 hours or until you are awake and alert. This information is not intended to replace advice given to you by your health care provider. Make sure you discuss any questions you have with your health care provider. Document Revised: 12/15/2017 Document Reviewed: 01/07/2016 Elsevier Patient Education  [place].

## 2020-09-13 ENCOUNTER — Other Ambulatory Visit: Payer: Self-pay

## 2020-09-13 ENCOUNTER — Other Ambulatory Visit (HOSPITAL_COMMUNITY)
Admission: RE | Admit: 2020-09-13 | Discharge: 2020-09-13 | Disposition: A | Discharge status: Home / Self Care | Payer: Medicare PPO | Source: Ambulatory Visit | Attending: Gastroenterology | Admitting: Gastroenterology

## 2020-09-13 ENCOUNTER — Encounter (HOSPITAL_COMMUNITY): Payer: Self-pay

## 2020-09-13 ENCOUNTER — Encounter (HOSPITAL_COMMUNITY)
Admission: RE | Admit: 2020-09-13 | Discharge: 2020-09-13 | Disposition: A | Discharge status: Home / Self Care | Payer: Medicare PPO | Source: Ambulatory Visit | Attending: Gastroenterology | Admitting: Gastroenterology

## 2020-09-13 DIAGNOSIS — K7460 Unspecified cirrhosis of liver: Secondary | ICD-10-CM | POA: Diagnosis not present

## 2020-09-13 DIAGNOSIS — Z01812 Encounter for preprocedural laboratory examination: Secondary | ICD-10-CM | POA: Insufficient documentation

## 2020-09-13 DIAGNOSIS — K7581 Nonalcoholic steatohepatitis (NASH): Secondary | ICD-10-CM | POA: Diagnosis not present

## 2020-09-13 DIAGNOSIS — Z20822 Contact with and (suspected) exposure to covid-19: Secondary | ICD-10-CM | POA: Insufficient documentation

## 2020-09-14 ENCOUNTER — Ambulatory Visit: Payer: Medicare PPO | Admitting: Family Medicine

## 2020-09-14 LAB — SARS CORONAVIRUS 2 (TAT 6-24 HRS): SARS Coronavirus 2: NEGATIVE

## 2020-09-15 ENCOUNTER — Ambulatory Visit (HOSPITAL_COMMUNITY)
Admission: RE | Admit: 2020-09-15 | Discharge: 2020-09-15 | Disposition: A | Discharge status: Home / Self Care | Payer: Medicare PPO | Attending: Gastroenterology | Admitting: Gastroenterology

## 2020-09-15 ENCOUNTER — Encounter (HOSPITAL_COMMUNITY): Payer: Self-pay | Admitting: Gastroenterology

## 2020-09-15 ENCOUNTER — Ambulatory Visit (HOSPITAL_COMMUNITY): Payer: Medicare PPO

## 2020-09-15 ENCOUNTER — Encounter (HOSPITAL_COMMUNITY)
Admission: RE | Disposition: A | Discharge status: Home / Self Care | Payer: Self-pay | Source: Home / Self Care | Attending: Gastroenterology

## 2020-09-15 DIAGNOSIS — D509 Iron deficiency anemia, unspecified: Secondary | ICD-10-CM | POA: Insufficient documentation

## 2020-09-15 DIAGNOSIS — K644 Residual hemorrhoidal skin tags: Secondary | ICD-10-CM | POA: Diagnosis not present

## 2020-09-15 DIAGNOSIS — Z794 Long term (current) use of insulin: Secondary | ICD-10-CM | POA: Diagnosis not present

## 2020-09-15 DIAGNOSIS — R152 Fecal urgency: Secondary | ICD-10-CM

## 2020-09-15 DIAGNOSIS — Z79899 Other long term (current) drug therapy: Secondary | ICD-10-CM | POA: Diagnosis not present

## 2020-09-15 DIAGNOSIS — Z885 Allergy status to narcotic agent status: Secondary | ICD-10-CM | POA: Diagnosis not present

## 2020-09-15 DIAGNOSIS — K5730 Diverticulosis of large intestine without perforation or abscess without bleeding: Secondary | ICD-10-CM | POA: Diagnosis not present

## 2020-09-15 DIAGNOSIS — Z881 Allergy status to other antibiotic agents status: Secondary | ICD-10-CM | POA: Diagnosis not present

## 2020-09-15 DIAGNOSIS — E119 Type 2 diabetes mellitus without complications: Secondary | ICD-10-CM | POA: Diagnosis not present

## 2020-09-15 DIAGNOSIS — K51019 Ulcerative (chronic) pancolitis with unspecified complications: Secondary | ICD-10-CM

## 2020-09-15 DIAGNOSIS — K219 Gastro-esophageal reflux disease without esophagitis: Secondary | ICD-10-CM | POA: Insufficient documentation

## 2020-09-15 DIAGNOSIS — Z888 Allergy status to other drugs, medicaments and biological substances status: Secondary | ICD-10-CM | POA: Insufficient documentation

## 2020-09-15 DIAGNOSIS — I10 Essential (primary) hypertension: Secondary | ICD-10-CM | POA: Diagnosis not present

## 2020-09-15 DIAGNOSIS — Z7901 Long term (current) use of anticoagulants: Secondary | ICD-10-CM | POA: Insufficient documentation

## 2020-09-15 DIAGNOSIS — M797 Fibromyalgia: Secondary | ICD-10-CM | POA: Diagnosis not present

## 2020-09-15 DIAGNOSIS — K5100 Ulcerative (chronic) pancolitis without complications: Secondary | ICD-10-CM | POA: Insufficient documentation

## 2020-09-15 DIAGNOSIS — R197 Diarrhea, unspecified: Secondary | ICD-10-CM | POA: Diagnosis not present

## 2020-09-15 DIAGNOSIS — E785 Hyperlipidemia, unspecified: Secondary | ICD-10-CM | POA: Insufficient documentation

## 2020-09-15 DIAGNOSIS — G4733 Obstructive sleep apnea (adult) (pediatric): Secondary | ICD-10-CM | POA: Diagnosis not present

## 2020-09-15 DIAGNOSIS — I480 Paroxysmal atrial fibrillation: Secondary | ICD-10-CM | POA: Diagnosis not present

## 2020-09-15 DIAGNOSIS — K5150 Left sided colitis without complications: Secondary | ICD-10-CM | POA: Diagnosis not present

## 2020-09-15 HISTORY — PX: COLONOSCOPY WITH PROPOFOL: SHX5780

## 2020-09-15 HISTORY — PX: BIOPSY: SHX5522

## 2020-09-15 LAB — GLUCOSE, CAPILLARY
Glucose-Capillary: 203 mg/dL — ABNORMAL HIGH (ref 70–99)
Glucose-Capillary: 176 mg/dL — ABNORMAL HIGH (ref 70–99)

## 2020-09-15 SURGERY — COLONOSCOPY WITH PROPOFOL
Anesthesia: General

## 2020-09-15 MED ORDER — LACTATED RINGERS IV SOLN: INTRAVENOUS | Status: DC

## 2020-09-15 MED ORDER — PROPOFOL 10 MG/ML IV BOLUS
INTRAVENOUS | Status: DC | PRN
  Administered 2020-09-15: 60 mg via INTRAVENOUS

## 2020-09-15 MED ORDER — LACTATED RINGERS IV SOLN: INTRAVENOUS | Status: DC | PRN

## 2020-09-15 MED ORDER — LIDOCAINE HCL (CARDIAC) PF 100 MG/5ML IV SOSY
PREFILLED_SYRINGE | INTRAVENOUS | Status: DC | PRN
  Administered 2020-09-15: 50 mg via INTRAVENOUS

## 2020-09-15 MED ORDER — PROPOFOL 500 MG/50ML IV EMUL
INTRAVENOUS | Status: DC | PRN
  Administered 2020-09-15: 75 ug/kg/min via INTRAVENOUS

## 2020-09-15 MED ORDER — COLESTIPOL HCL 1 G PO TABS: 2.0000 g | ORAL_TABLET | Freq: Every day | ORAL | 3 refills | Status: DC

## 2020-09-15 MED ORDER — PROPOFOL 10 MG/ML IV BOLUS
INTRAVENOUS | Status: AC
  Filled 2020-09-15: qty 20

## 2020-09-15 NOTE — Discharge Instructions (Signed)
You are being discharged to home.  Resume your previous diet.  We are waiting for your pathology results.  Your physician has recommended a repeat colonoscopy for surveillance based on pathology results.  Continue Remicade every 8 weeks.  Increase Colestid to 2 g qday.       Colonoscopy, Adult, Care After This sheet gives you information about how to care for yourself after your procedure. Your doctor may also give you more specific instructions. If you have problems or questions, call your doctor. What can I expect after the procedure? After the procedure, it is common to have:  A small amount of blood in your poop (stool) for 24 hours.  Some gas.  Mild cramping or bloating in your belly (abdomen). Follow these instructions at home: Eating and drinking   Drink enough fluid to keep your pee (urine) pale yellow.  Follow instructions from your doctor about what you cannot eat or drink.  Return to your normal diet as told by your doctor. Avoid heavy or fried foods that are hard to digest. Activity  Rest as told by your doctor.  Do not sit for a long time without moving. Get up to take short walks every 1-2 hours. This is important. Ask for help if you feel weak or unsteady.  Return to your normal activities as told by your doctor. Ask your doctor what activities are safe for you. To help cramping and bloating:   Try walking around.  Put heat on your belly as told by your doctor. Use the heat source that your doctor recommends, such as a moist heat pack or a heating pad. ? Put a towel between your skin and the heat source. ? Leave the heat on for 20-30 minutes. ? Remove the heat if your skin turns bright red. This is very important if you are unable to feel pain, heat, or cold. You may have a greater risk of getting burned. General instructions  For the first 24 hours after the procedure: ? Do not drive or use machinery. ? Do not sign important documents. ? Do not  drink alcohol. ? Do your daily activities more slowly than normal. ? Eat foods that are soft and easy to digest.  Take over-the-counter or prescription medicines only as told by your doctor.  Keep all follow-up visits as told by your doctor. This is important. Contact a doctor if:  You have blood in your poop 2-3 days after the procedure. Get help right away if:  You have more than a small amount of blood in your poop.  You see large clumps of tissue (blood clots) in your poop.  Your belly is swollen.  You feel like you may vomit (nauseous).  You vomit.  You have a fever.  You have belly pain that gets worse, and medicine does not help your pain. Summary  After the procedure, it is common to have a small amount of blood in your poop. You may also have mild cramping and bloating in your belly.  For the first 24 hours after the procedure, do not drive or use machinery, do not sign important documents, and do not drink alcohol.  Get help right away if you have a lot of blood in your poop, feel like you may vomit, have a fever, or have more belly pain. This information is not intended to replace advice given to you by your health care provider. Make sure you discuss any questions you have with your health care provider. Document  Revised: 04/12/2019 Document Reviewed: 04/12/2019 Elsevier Patient Education  [place] After These instructions provide you with information about caring for yourself after your procedure. Your health care provider may also give you more specific instructions. Your treatment has been planned according to current medical practices, but problems sometimes occur. Call your health care provider if you have any problems or questions after your procedure. What can I expect after the procedure? After your procedure, you may:  Feel sleepy for several hours.  Feel clumsy and have poor balance for several  hours.  Feel forgetful about what happened after the procedure.  Have poor judgment for several hours.  Feel nauseous or vomit.  Have a sore throat if you had a breathing tube during the procedure. Follow these instructions at home: For at least 24 hours after the procedure:      Have a responsible adult stay with you. It is important to have someone help care for you until you are awake and alert.  Rest as needed.  Do not: ? Participate in activities in which you could fall or become injured. ? Drive. ? Use heavy machinery. ? Drink alcohol. ? Take sleeping pills or medicines that cause drowsiness. ? Make important decisions or sign legal documents. ? Take care of children on your own. Eating and drinking  Follow the diet that is recommended by your health care provider.  If you vomit, drink water, juice, or soup when you can drink without vomiting.  Make sure you have little or no nausea before eating solid foods. General instructions  Take over-the-counter and prescription medicines only as told by your health care provider.  If you have sleep apnea, surgery and certain medicines can increase your risk for breathing problems. Follow instructions from your health care provider about wearing your sleep device: ? Anytime you are sleeping, including during daytime naps. ? While taking prescription pain medicines, sleeping medicines, or medicines that make you drowsy.  If you smoke, do not smoke without supervision.  Keep all follow-up visits as told by your health care provider. This is important. Contact a health care provider if:  You keep feeling nauseous or you keep vomiting.  You feel light-headed.  You develop a rash.  You have a fever. Get help right away if:  You have trouble breathing. Summary  For several hours after your procedure, you may feel sleepy and have poor judgment.  Have a responsible adult stay with you for at least 24 hours or until  you are awake and alert. This information is not intended to replace advice given to you by your health care provider. Make sure you discuss any questions you have with your health care provider. Document Revised: 12/15/2017 Document Reviewed: 01/07/2016 Elsevier Patient Education  [place].    Colestipol tablets What is this medicine? COLESTIPOL (koe LES ti pole) is used to lower cholesterol in patients who are at risk of heart disease or stroke. This medicine is only for patients whose cholesterol level is not controlled by diet. This medicine may be used for other purposes; ask your health care provider or pharmacist if you have questions. COMMON BRAND NAME(S): Colestid What should I tell my health care provider before I take this medicine? They need to know if you have any of these conditions:  constipation or bowel obstruction  an unusual or allergic reaction to colestipol, other medicines, foods, dyes, or preservatives  pregnant or trying to  get pregnant  breast-feeding How should I use this medicine? Take this medicine by mouth with a full glass of water. Follow the directions on the prescription label. Tablets must be taken one at a time and swallowed whole. Do not cut, crush or chew. Take other medicines at least 1 hour before or 4 hours after this medicine. Take your doses at regular intervals. Do not take your medicine more often than directed. Talk to your pediatrician regarding the use of this medicine in children. Special care may be needed. Overdosage: If you think you have taken too much of this medicine contact a poison control center or emergency room at once. NOTE: This medicine is only for you. Do not share this medicine with others. What if I miss a dose? If you miss a dose, take it as soon as you can. If it is almost time for your next dose, take only that dose. Do not take double or extra doses. What may interact with this  medicine?  diuretics  gemfibrozil  heart medicines such as digoxin or digitoxin  penicillin G  phytonadione (vitamin k)  propranolol  tetracycline antibiotics  vitamin A  vitamin D This list may not describe all possible interactions. Give your health care provider a list of all the medicines, herbs, non-prescription drugs, or dietary supplements you use. Also tell them if you smoke, drink alcohol, or use illegal drugs. Some items may interact with your medicine. What should I watch for while using this medicine? Visit your doctor or health care professional for regular checks on your progress. Your blood fats and other tests will be measured from time to time. This medicine is only part of a total cholesterol-lowering program. Your health care professional or dietician can suggest a low-cholesterol and low-fat diet that will reduce your risk of getting heart and blood vessel disease. Avoid alcohol and smoking, and keep a proper exercise schedule. To reduce the chance of getting constipated, drink plenty of water and increase the amount of fiber in your diet. Ask your doctor or health care professional for advice if you are constipated. This medicine may cause a decrease in folic acid. You should make sure that you get enough folic acid while you are taking this medicine. Discuss the foods you eat and the vitamins you take with your health care professional. What side effects may I notice from receiving this medicine? Side effects that you should report to your doctor or health care professional as soon as possible:  allergic reactions like skin rash, itching or hives, swelling of the face, lips, or tongue  bloody or black, tarry stools  severe stomach pain with nausea and vomiting  unusual bleeding or bruising  weight loss Side effects that usually do not require medical attention (report to your doctor or health care professional if they continue or are  bothersome):  diarrhea  dizziness  headache  indigestion  nausea, vomiting  stomach upset, belching, or bloating This list may not describe all possible side effects. Call your doctor for medical advice about side effects. You may report side effects to FDA at 1-800-FDA-1088. Where should I keep my medicine? Keep out of the reach of children. Store at room temperature between 20 and 25 degrees C (68 and 77 degrees F). Throw away any unused medicine after the expiration date. NOTE: This sheet is a summary. It may not cover all possible information. If you have questions about this medicine, talk to your doctor, pharmacist, or health care provider.  2020 Elsevier/Gold Standard (2017-05-05 15:37:30)

## 2020-09-15 NOTE — Transfer of Care (Signed)
Immediate Anesthesia Transfer of Care Note  Patient: [name]  Procedure(s) Performed: COLONOSCOPY WITH PROPOFOL (N/A ) BIOPSY  Patient Location: PACU  Anesthesia Type:General  Level of Consciousness: awake and alert   Airway & Oxygen Therapy: Patient Spontanous Breathing  Post-op Assessment: Report given to RN and Post -op Vital signs reviewed and stable  Post vital signs: Reviewed and stable  Last Vitals:  Vitals Value Taken Time  BP 144/65   Temp 97.8   Pulse 71 09/15/20 1127  Resp 19 09/15/20 1127  SpO2 97 % 09/15/20 1127  Vitals shown include unvalidated device data.  Last Pain:  Vitals:   09/15/20 1035  TempSrc:   PainSc: 0-No pain      Patients Stated Pain Goal: 8 (33/88/26 6664)  Complications: No complications documented.

## 2020-09-15 NOTE — Anesthesia Postprocedure Evaluation (Signed)
Anesthesia Post Note  Patient: [name]  Procedure(s) Performed: COLONOSCOPY WITH PROPOFOL (N/A ) BIOPSY  Patient location during evaluation: PACU Anesthesia Type: General Level of consciousness: awake and oriented Pain management: pain level controlled Vital Signs Assessment: post-procedure vital signs reviewed and stable Respiratory status: spontaneous breathing Cardiovascular status: blood pressure returned to baseline and stable Postop Assessment: no apparent nausea or vomiting Anesthetic complications: no   No complications documented.   Last Vitals:  Vitals:   09/15/20 1001  BP: (!) 149/65  Pulse: 68  Resp: 18  Temp: 36.6 C  SpO2: 98%    Last Pain:  Vitals:   09/15/20 1035  TempSrc:   PainSc: 0-No pain                 Karna Dupes

## 2020-09-15 NOTE — Anesthesia Preprocedure Evaluation (Signed)
Anesthesia Evaluation  Patient identified by MRN, date of birth, ID band Patient awake    Reviewed: Allergy & Precautions, H&P , NPO status , Patient's Chart, lab work & pertinent test results, reviewed documented beta blocker date and time   History of Anesthesia Complications (+) PONV  Airway Mallampati: II  TM Distance: >3 FB Neck ROM: full    Dental no notable dental hx.    Pulmonary sleep apnea ,    Pulmonary exam normal breath sounds clear to auscultation       Cardiovascular Exercise Tolerance: Good hypertension, negative cardio ROS   Rhythm:regular Rate:Normal     Neuro/Psych PSYCHIATRIC DISORDERS Anxiety  Neuromuscular disease    GI/Hepatic PUD, GERD  Medicated,(+) Hepatitis -, Toxin Related  Endo/Other  negative endocrine ROSdiabetes  Renal/GU negative Renal ROS  negative genitourinary   Musculoskeletal   Abdominal   Peds  Hematology  (+) Blood dyscrasia, anemia ,   Anesthesia Other Findings   Reproductive/Obstetrics negative OB ROS                             Anesthesia Physical Anesthesia Plan  ASA: III  Anesthesia Plan: General   Post-op Pain Management:    Induction:   PONV Risk Score and Plan: Propofol infusion  Airway Management Planned:   Additional Equipment:   Intra-op Plan:   Post-operative Plan:   Informed Consent: I have reviewed the patients History and Physical, chart, labs and discussed the procedure including the risks, benefits and alternatives for the proposed anesthesia with the patient or authorized representative who has indicated his/her understanding and acceptance.     Dental Advisory Given  Plan Discussed with: CRNA  Anesthesia Plan Comments:         Anesthesia Quick Evaluation

## 2020-09-15 NOTE — Op Note (Signed)
[facility] Patient Name: [name] Procedure Date: 09/15/2020 10:35 AM MRN: [id number] Date of Birth: [birth year] Attending MD: [name] ,  CSN: [id number] Age: 74 Admit Type: Outpatient Procedure:                Colonoscopy Indications:              Clinically significant diarrhea of unexplained                            origin, Chronic ulcerative pancolitis Providers:                [name], Janeece Riggers, RN, Caprice Kluver,                            Randa Spike, Technician Referring MD:              Medicines:                Monitored Anesthesia Care Complications:            No immediate complications. Estimated Blood Loss:     Estimated blood loss: none. Procedure:                Pre-Anesthesia Assessment:                           - Prior to the procedure, a History and Physical                            was performed, and patient medications, allergies                            and sensitivities were reviewed. The patient's                            tolerance of previous anesthesia was reviewed.                           - The risks and benefits of the procedure and the                            sedation options and risks were discussed with the                            patient. All questions were answered and informed                            consent was obtained.                           - ASA Grade Assessment: III - A patient with severe                            systemic disease.                           After obtaining informed consent, the colonoscope  was passed under direct vision. Throughout the                            procedure, the patient's blood pressure, pulse, and                            oxygen saturations were monitored continuously. The                            [id number]-[id number] ([id number]) scope was introduced through                            the anus and advanced to the the terminal ileum.                             The colonoscopy was performed without difficulty.                            The patient tolerated the procedure well. The                            quality of the bowel preparation was good. Scope                            withdrawal time was 12 minutes. Scope In: 10:54:44 AM Scope Out: 11:21:15 AM Scope Withdrawal Time: 0 hours 20 minutes 37 seconds  Total Procedure Duration: 0 hours 26 minutes 31 seconds  Findings:      Skin tags were found on perianal exam. No other lesions were seen upon       inspection.      The terminal ileum appeared normal.      Inflammation was not found based on the endoscopic appearance of the       mucosa in the colon. This was graded as Mayo Score 0 (normal or inactive       disease), and when compared to the previous examination, the findings       are improved. There was a focal area of edema in the sigmoid. Biopsies       were taken with a cold forceps for histology every 10 cm.      A few small-mouthed diverticula were found in the sigmoid colon.      The retroflexed view of the distal rectum and anal verge was normal and       showed no anal or rectal abnormalities. Impression:               - Perianal skin tags found on perianal exam.                           - The examined portion of the ileum was normal.                           - Inactive (Mayo Score 0) ulcerative colitis,                            improved since the last examination. Biopsied.                           -  Diverticulosis in the sigmoid colon.                           - The distal rectum and anal verge are normal on                            retroflexion view. Moderate Sedation:      Per Anesthesia Care Recommendation:           - Discharge patient to home (ambulatory).                           - Resume previous diet.                           - Increase Colestid to 2 g qday.                           - Await pathology results.                            - Repeat colonoscopy for surveillance based on                            pathology results.                           - Continue Remicade every 8 weeks. Procedure Code(s):        --- Professional ---                           [phone], GC, Colonoscopy, flexible; with biopsy,                            single or multiple Diagnosis Code(s):        --- Professional ---                           K64.4, Residual hemorrhoidal skin tags                           R19.7, Diarrhea, unspecified                           K51.00, Ulcerative (chronic) pancolitis without                            complications                           K57.30, Diverticulosis of large intestine without                            perforation or abscess without bleeding CPT copyright 2019 American Medical Association. All rights reserved. The codes documented in this report are preliminary and upon coder review may  be revised to meet current compliance requirements. [name], MD [name],  09/15/2020 11:31:28 AM This report has been signed electronically. Number  of Addenda: 0

## 2020-09-15 NOTE — H&P (Signed)
[name] is an 74 y.o. female.   Chief Complaint: Fecal urgency and occasional incontinence HPI: [name] is a 74 y.o. female with past medical history of GERD, fibromyalgia, hyperlipidemia, hypertension, iron deficiency anemia, ulcerative pancolitis, paroxysmal atrial fibrillation on anticoagulation, OSA, NASH, who presents for endoscopic follow up of ulcerative pancolitis and fecal incontinence.  Patient states that since she was seen in clinic recently, her urgency improved as she is only having 2-3 times a week with fecal urgency and sometimes incontinence.  Denies seeing any blood in her stool.  No frequent abdominal pain, nausea, vomiting, fever or chills.  Last Colonoscopy: 06/11/2019 -no presence of inflammation throughout the colon, although there were mild erosions at the appendiceal orifice and mildly congested mucosa was found in the ascending colon.  Biopsies showed focal colitis with mild edema in the cecum and the right ascending colon but no presence of chronic inflammation.  She is currently on Remicade every 8 weeks.  Past Medical History:  Diagnosis Date  . Anxiety   . 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 Cirrhosis, nonalcoholic ([place]) [id number]/9509   NASH--> early cirrhotic changes on ultrasound 07/2018. ? to get liver bx if she gets bariatric surgery? Mild portal hypertensive gastropathy on EGD 08/2019.  Marland Kitchen Complication of anesthesia   . Fibromyalgia   . GERD   . History of thrombocytopenia 12/2011  . Hyperlipidemia    Intolerant of statins  . HYPERTENSION   . IBS (irritable bowel syndrome)    -D.  Good response to bentyl and imodium as of 06/2018 GI f/u.  . IDA (iron deficiency anemia) 12/2018   Possibly inadequate absorption secondary to chronic/long term PPI therapy. No overt GI bleding. Endoscopic w/u including givens unrevealing. Dr. Laural [name] suspects poor iron absorption.  Oral iron restarted 12/2019.    Marland Kitchen IDDM (insulin dependent diabetes  mellitus)    with DPN (managed by Dr. Cruzita Lederer but then in 2018 pt preferred to have me manage for her convenience)  . Limited mobility    Requires a walker for arthritic pain, widespread musculoskeletal pain, and neuropathic pain.  . Morbid obesity ([place])    As of 11/2018, pt considering sleeve gastectomy vs bipass as of eval by Dr. De Burrs considering as of 12/2019.  Marland Kitchen Nonalcoholic steatohepatitis    Viral Hep screens NEG.  CT 2015.  Transaminasemia.  U/S 07/2018 showed early changes of cirrhosis.  . OSA (obstructive sleep apnea) 09/14/2015   sleep study 09/07/15: severe obstructive sleep apnea with an AHI of 72 and SaO2 low of 75%.>refer to sleep med for eval and tx options  . Osteoarthritis    hips, shoulders, knees  . PAF (paroxysmal atrial fibrillation) ([place])    One documented episode (after getting EGD 2016).  Was on amiodarone x 3 mo.  Rate control with metoprolol + anticoag with xarelto.   Marland Kitchen PONV (postoperative nausea and vomiting)   . Recurrent epistaxis    Granuloma in L nare cauterized by ENT 04/2020. Another cautery 06/2020  . Small fiber neuropathy    Due to DM.  Symmetric hands and feet tingling/numbness.  Marland Kitchen Ulcerative colitis ([place])    Remicade infusion Q 8 weeks: in clinical and endoscopic remission as of 12/2018 GI f/u.  06/11/19 rpt colonoscopy->cecal and ascending colon colitis.    Past Surgical History:  Procedure Laterality Date  . ABDOMINAL HYSTERECTOMY  1980   Paps no longer indicated.  Marland Kitchen BACTERIAL OVERGROWTH TEST N/A 07/13/2015   Procedure:  BACTERIAL OVERGROWTH TEST;  Surgeon: Rogene Houston, MD;  [facility];  Service: Endoscopy;  Laterality: N/A;  730    . BILATERAL SALPINGOOPHORECTOMY  02/10/2001  . BIOPSY  06/11/2019   Procedure: BIOPSY;  Surgeon: Rogene Houston, MD;  [facility];  Service: Endoscopy;;  colon  . BIOPSY  09/27/2019   Procedure: BIOPSY;  Surgeon: Rogene Houston, MD;  [facility];  Service: Endoscopy;;   gastric duodenal  . BREAST REDUCTION SURGERY  1994   bilat  . CARDIOVASCULAR STRESS TEST  07/2010   Lexiscan myoview: normal  . CARPAL TUNNEL RELEASE Right 1996  . CARPAL TUNNEL RELEASE Left 03/21/2003  . CARPAL TUNNEL RELEASE Right 05/04/2013   Procedure: CARPAL TUNNEL RELEASE;  Surgeon: Cammie Sickle., MD;  Location: [facility];  Service: Orthopedics;  Laterality: Right;  . CARPAL TUNNEL RELEASE Left 09/21/2013   Procedure: LEFT CARPAL TUNNEL RELEASE;  Surgeon: Cammie Sickle., MD;  Location: [employer];  Service: Orthopedics;  Laterality: Left;  . CHOLECYSTECTOMY    . COLONOSCOPY WITH PROPOFOL N/A 08/04/2015   Colitis in remission.  No polyps.  Procedure: COLONOSCOPY WITH PROPOFOL;  Surgeon: Rogene Houston, MD;  [facility];  Service: Endoscopy;  Laterality: N/A;  cecum time in  0820   time out  0827    total time 7 minutes  . COLONOSCOPY WITH PROPOFOL N/A 06/11/2019   cecal and ascending colon colitis.  Procedure: COLONOSCOPY WITH PROPOFOL;  Surgeon: Rogene Houston, MD;  [facility];  Service: Endoscopy;  Laterality: N/A;  730a  . ESOPHAGEAL DILATION N/A 08/04/2015   Procedure: ESOPHAGEAL DILATION;  Surgeon: Rogene Houston, MD;  [facility];  Service: Endoscopy;  Laterality: N/A;  [place] 57, no mucousal disruption  . ESOPHAGOGASTRODUODENOSCOPY  09/27/2019   Performed for IDA.  Esoph dilation was done but no stricture present.  Mild portal hypertensive gastropathy, o/w normal.  Duodenal bx NEG.  h pylori neg.  Marland Kitchen ESOPHAGOGASTRODUODENOSCOPY (EGD) WITH ESOPHAGEAL DILATION  12/02/2005  . ESOPHAGOGASTRODUODENOSCOPY (EGD) WITH PROPOFOL N/A 08/04/2015   Procedure: ESOPHAGOGASTRODUODENOSCOPY (EGD) WITH PROPOFOL;  Surgeon: Rogene Houston, MD;  [facility];  Service: Endoscopy;  Laterality: N/A;  procedure 1  . ESOPHAGOGASTRODUODENOSCOPY (EGD) WITH PROPOFOL N/A 09/27/2019   Procedure: ESOPHAGOGASTRODUODENOSCOPY (EGD) WITH PROPOFOL;   Surgeon: Rogene Houston, MD;  [facility];  Service: Endoscopy;  Laterality: N/A;  12:10  . FLEXIBLE SIGMOIDOSCOPY  01/17/2012   Procedure: FLEXIBLE SIGMOIDOSCOPY;  Surgeon: Rogene Houston, MD;  [facility];  Service: Endoscopy;  Laterality: N/A;  . GIVENS CAPSULE STUDY N/A 08/03/2019   Procedure: GIVENS CAPSULE STUDY (performed for IDA)->some food debris in stomach and small amount of blood.  Surgeon: Rogene Houston, MD;  [facility];  Service: Endoscopy;  Laterality: N/A;  730AM  . HEMILAMINOTOMY LUMBAR SPINE Bilateral 09/07/1999   L4-5  . KNEE ARTHROSCOPY Right 01/1999; 10/2000  . LYSIS OF ADHESION  02/10/2001  . MALONEY DILATION  09/27/2019   Procedure: MALONEY DILATION;  Surgeon: Rogene Houston, MD;  [facility];  Service: Endoscopy;;  . [place]; 09/12/2006  . TARSAL TUNNEL RELEASE  2002  . TRANSTHORACIC ECHOCARDIOGRAM  08/04/2015   EF 60-65%, normal wall motion, mild LVH, mild LA dilation, grd I DD.  Marland Kitchen TUMOR EXCISION Left 03/21/2003   dorsal 1st web space (hand)  . URETEROLYSIS Right 02/10/2001  Family History  Problem Relation Age of Onset  . Diabetes Mother   . Hypertension Mother   . Heart attack Father        Mid 59's  . Heart disease Father   . Lung disease Father        spot on lung; had lung surgery  . Alcohol abuse Other   . Hypertension Son   . Diabetes Son   . Emphysema Maternal Grandfather   . Asthma Maternal Grandfather   . Colon cancer Paternal Grandfather   . Stomach cancer Paternal Grandmother   . Neuropathy Neg Hx    Social History:  reports that she has never smoked. She has never used smokeless tobacco. She reports current alcohol use. She reports that she does not use drugs.  Allergies:  Allergies  Allergen Reactions  . Omeprazole Anaphylaxis and Swelling    SWELLING OF TONGUE AND THROAT  . Actos [Pioglitazone] Other (See Comments)    Weight gain  . Benzocaine-Menthol Swelling     SWELLING OF MOUTH  . Colesevelam Other (See Comments)    GI UPSET  . Flagyl [Metronidazole Hcl] Other (See Comments)    DIAPHORESIS  . Metformin And Related Diarrhea  . Shrimp [Shellfish Allergy] Itching    OF THROAT AND EARS  . Statins Palpitations  . Desipramine Hcl Itching, Nausea Only and Other (See Comments)    "swimmy" headed, ears itched   . Hydromorphone Itching  . Jardiance [Empagliflozin] Other (See Comments)    weakness  . Lactose Intolerance (Gi) Diarrhea    Gas, bloating  . Adhesive [Tape] Other (See Comments)    SKIN IRRITATION AND BRUISING  . Nisoldipine Itching  . Percocet [Oxycodone-Acetaminophen] Itching    Medications Prior to Admission  Medication Sig Dispense Refill  . acetaminophen (TYLENOL) 325 MG tablet Take 650 mg by mouth every 6 (six) hours as needed for moderate pain or headache.    . citalopram (CELEXA) 20 MG tablet TAKE 1 TABLET BY MOUTH EVERY DAY (Patient taking differently: Take 20 mg by mouth daily.) 90 tablet 0  . colestipol (COLESTID) 1 g tablet Take 1 tablet (1 g total) by mouth 2 (two) times daily. (Patient taking differently: Take 1 g by mouth daily.) 60 tablet 3  . Cyanocobalamin (B-12) 5000 MCG CAPS Take 5,000 mcg by mouth daily.    Marland Kitchen esomeprazole (NEXIUM) 20 MG capsule Take 20 mg by mouth daily.     . ferrous sulfate 325 (65 FE) MG tablet Take 1 tablet (325 mg total) by mouth 2 (two) times daily with a meal. (Patient taking differently: Take 325 mg by mouth daily. )    . furosemide (LASIX) 40 MG tablet TAKE 1 TABLET BY MOUTH EVERY DAY (Patient taking differently: Take 40 mg by mouth daily.) 90 tablet 1  . gabapentin (NEURONTIN) 600 MG tablet TAKE 1 AND 1/2 TABLETS BY MOUTH TWICE A DAY (Patient taking differently: Take 900 mg by mouth 2 (two) times daily.) 270 tablet 0  . inFLIXimab (REMICADE) 100 MG injection Inject 100 mg into the vein every 8 (eight) weeks.     Marland Kitchen KLOR-CON M10 10 MEQ tablet TAKE 1 TABLET BY MOUTH EVERY DAY (Patient taking  differently: Take 10 mEq by mouth daily.) 90 tablet 1  . lactase (LACTAID) 3000 UNITS tablet Take 3,000 Units by mouth as needed (when eating foods containing dairy).     . meclizine (ANTIVERT) 25 MG tablet Take 1 tablet (25 mg total) by mouth 3 (three) times daily as  needed for dizziness or nausea. 90 tablet 0  . metoprolol succinate (TOPROL-XL) 100 MG 24 hr tablet 1 and 1/2 tabs po qd (Patient taking differently: Take 100 mg by mouth 2 (two) times daily.) 180 tablet 0  . Multiple Vitamin (MULTIVITAMIN WITH MINERALS) TABS tablet Take 1 tablet by mouth daily.    . Polyvinyl Alcohol-Povidone (REFRESH OP) Place 1 drop into both eyes daily as needed (dry eyes).    . ACCU-CHEK GUIDE test strip USE TO CHECK BLOOD GLUCOSE UP TO 6 TIMES DAILY AS DIRECTED 200 strip 1  . Accu-Chek Softclix Lancets lancets USE TO CHECK BLOOD GLUCOSE UP TO 6 TIMES DAILY AS DIRECTED 200 each 1  . blood glucose meter kit and supplies KIT Use up to six times daily as directed. DX. E11.9 1 each 0  . Insulin Pen Needle (B-D UF III MINI PEN NEEDLES) 31G X 5 MM MISC USE TO INJECT INSULINS EQUAL TO 6 TIMES DAILY. 600 each 5  . insulin regular human CONCENTRATED (HUMULIN R U-500 KWIKPEN) 500 UNIT/ML kwikpen 130 Units at BF, 140 Units at lunch, and 130 Units at supper.  Take an additional 10 Units at bedtime if glucose is >200. (Patient taking differently: Inject 125-135 Units into the skin See admin instructions. Inject 125 units with breakfast, 135 units with lunch, and 125 units with supper.) 60 mL 2  . loperamide (IMODIUM) 2 MG capsule Take 2-4 mg by mouth as needed for diarrhea or loose stools.  30 capsule   . TRULICITY 1.5 VE/7.2CN SOPN INJECT UNDER SKIN 1.5 MG WEEKLY (Patient taking differently: Inject 1.5 mg as directed every Thursday.) 12 pen 3  . XARELTO 20 MG TABS tablet TAKE 1 TABLET (20 MG TOTAL) BY MOUTH DAILY WITH SUPPER. (Patient taking differently: Take 20 mg by mouth daily with supper.) 90 tablet 1    Results for orders  placed or performed during the hospital encounter of 09/13/20 (from the past 48 hour(s))  SARS CORONAVIRUS 2 (TAT 6-24 HRS) Nasopharyngeal Nasopharyngeal Swab     Status: None   Collection Time: 09/13/20  2:12 PM   Specimen: Nasopharyngeal Swab  Result Value Ref Range   SARS Coronavirus 2 NEGATIVE NEGATIVE    Comment: (NOTE) SARS-CoV-2 target nucleic acids are NOT DETECTED.  The SARS-CoV-2 RNA is generally detectable in upper and lower respiratory specimens during the acute phase of infection. Negative results do not preclude SARS-CoV-2 infection, do not rule out co-infections with other pathogens, and should not be used as the sole basis for treatment or other patient management decisions. Negative results must be combined with clinical observations, patient history, and epidemiological information. The expected result is Negative.  Fact Sheet for Patients: SugarRoll.be  Fact Sheet for Healthcare Providers: https://www.woods-mathews.com/  This test is not yet approved or cleared by the [place] FDA and  has been authorized for detection and/or diagnosis of SARS-CoV-2 by FDA under an Emergency Use Authorization (EUA). This EUA will remain  in effect (meaning this test can be used) for the duration of the COVID-19 declaration under Se ction 564(b)(1) of the Act, 21 U.S.C. section 360bbb-3(b)(1), unless the authorization is terminated or revoked sooner.  Performed at [facility], [place] [address] [place], [place] [address]    *Note: Due to a large number of results and/or encounters for the requested time period, some results have not been displayed. A complete set of results can be found in Results Review.   No results found.  Review of Systems  Constitutional: Negative.   HENT: Negative.   Eyes: Negative.   Respiratory: Negative.   Cardiovascular: Negative.   Gastrointestinal: Positive for diarrhea.  Endocrine:  Negative.   Genitourinary: Negative.   Musculoskeletal: Negative.   Skin: Negative.   Allergic/Immunologic: Negative.   Neurological: Negative.   Hematological: Negative.   Psychiatric/Behavioral: Negative.     Blood pressure (!) 149/65, pulse 68, temperature 97.8 F (36.6 C), temperature source Oral, resp. rate 18, SpO2 98 %. Physical Exam  GENERAL: The patient is AO x3, in no acute distress. Obese. HEENT: Head is normocephalic and atraumatic. EOMI are intact. Mouth is well hydrated and without lesions. NECK: Supple. No masses LUNGS: Clear to auscultation. No presence of rhonchi/wheezing/rales. Adequate chest expansion HEART: RRR, normal s1 and s2. ABDOMEN: Soft, nontender, no guarding, no peritoneal signs, and nondistended. BS +. No masses. EXTREMITIES: Without any cyanosis, clubbing, rash, lesions or edema. NEUROLOGIC: AOx3, no focal motor deficit. SKIN: no jaundice, no rashes  Assessment/Plan SHANECIA HOGANSON is a 74 y.o. female with past medical history of GERD, fibromyalgia, hyperlipidemia, hypertension, iron deficiency anemia, ulcerative pancolitis, paroxysmal atrial fibrillation on anticoagulation, OSA, NASH, who presents for endoscopic follow up of ulcerative pancolitis and fecal incontinence. Will proceed with colonoscopy today.  Harvel Quale, MD 09/15/2020, 10:47 AM

## 2020-09-16 ENCOUNTER — Other Ambulatory Visit: Payer: Self-pay | Admitting: Cardiology

## 2020-09-17 LAB — HEPATITIS B SURFACE ANTIGEN: Hepatitis B Surface Ag: NONREACTIVE

## 2020-09-17 LAB — HEPATITIS B SURFACE ANTIBODY,QUALITATIVE: Hep B S Ab: NONREACTIVE

## 2020-09-17 LAB — MITOCHONDRIAL ANTIBODIES: Mitochondrial M2 Ab, IgG: <=20 U

## 2020-09-17 LAB — HEPATITIS A ANTIBODY, TOTAL: Hepatitis A AB,Total: REACTIVE — AB

## 2020-09-17 LAB — HEPATITIS B CORE ANTIBODY, TOTAL: Hep B Core Total Ab: NONREACTIVE

## 2020-09-17 LAB — ANA: Anti Nuclear Antibody (ANA): NEGATIVE

## 2020-09-17 LAB — PROTIME-INR
INR: 1.3 — ABNORMAL HIGH
Prothrombin Time: 13.9 s — ABNORMAL HIGH (ref 9.0–11.5)

## 2020-09-17 LAB — ANTI-SMOOTH MUSCLE ANTIBODY, IGG: Actin (Smooth Muscle) Antibody (IGG): <20 U (ref <20)

## 2020-09-17 LAB — HEPATITIS C ANTIBODY
Hepatitis C Ab: NONREACTIVE
SIGNAL TO CUT-OFF: 0.01 (ref <1.00)

## 2020-09-17 LAB — IGA: Immunoglobulin A: 454 mg/dL — ABNORMAL HIGH (ref 70–320)

## 2020-09-17 LAB — TISSUE TRANSGLUTAMINASE, IGA: (tTG) Ab, IgA: <1 U/mL

## 2020-09-18 LAB — SURGICAL PATHOLOGY

## 2020-09-20 ENCOUNTER — Ambulatory Visit (HOSPITAL_COMMUNITY): Payer: Medicare PPO

## 2020-09-20 ENCOUNTER — Ambulatory Visit (HOSPITAL_COMMUNITY)
Admission: RE | Admit: 2020-09-20 | Discharge: 2020-09-20 | Disposition: A | Discharge status: Home / Self Care | Payer: Medicare PPO | Source: Ambulatory Visit | Attending: Gastroenterology | Admitting: Gastroenterology

## 2020-09-20 ENCOUNTER — Other Ambulatory Visit: Payer: Self-pay | Admitting: Family Medicine

## 2020-09-20 ENCOUNTER — Other Ambulatory Visit: Payer: Self-pay

## 2020-09-20 DIAGNOSIS — K529 Noninfective gastroenteritis and colitis, unspecified: Secondary | ICD-10-CM | POA: Diagnosis not present

## 2020-09-20 DIAGNOSIS — I700 Atherosclerosis of aorta: Secondary | ICD-10-CM | POA: Diagnosis not present

## 2020-09-20 DIAGNOSIS — K7689 Other specified diseases of liver: Secondary | ICD-10-CM | POA: Diagnosis not present

## 2020-09-20 DIAGNOSIS — K7581 Nonalcoholic steatohepatitis (NASH): Secondary | ICD-10-CM | POA: Diagnosis not present

## 2020-09-20 DIAGNOSIS — K769 Liver disease, unspecified: Secondary | ICD-10-CM | POA: Diagnosis not present

## 2020-09-20 DIAGNOSIS — K7460 Unspecified cirrhosis of liver: Secondary | ICD-10-CM | POA: Diagnosis not present

## 2020-09-20 LAB — POCT I-STAT CREATININE: Creatinine, Ser: 0.6 mg/dL (ref 0.44–1.00)

## 2020-09-20 MED ORDER — IOHEXOL 300 MG/ML  SOLN
100.0000 mL | Freq: Once | INTRAMUSCULAR | Status: AC | PRN
  Administered 2020-09-20: 15:00:00 100 mL via INTRAVENOUS

## 2020-09-26 ENCOUNTER — Encounter (HOSPITAL_COMMUNITY): Payer: Self-pay | Admitting: Gastroenterology

## 2020-10-03 ENCOUNTER — Telehealth (INDEPENDENT_AMBULATORY_CARE_PROVIDER_SITE_OTHER): Payer: Self-pay

## 2020-10-03 ENCOUNTER — Ambulatory Visit (INDEPENDENT_AMBULATORY_CARE_PROVIDER_SITE_OTHER): Payer: Medicare PPO | Admitting: Internal Medicine

## 2020-10-03 ENCOUNTER — Other Ambulatory Visit: Payer: Self-pay

## 2020-10-03 ENCOUNTER — Encounter (INDEPENDENT_AMBULATORY_CARE_PROVIDER_SITE_OTHER): Payer: Self-pay | Admitting: Internal Medicine

## 2020-10-03 VITALS — BP 132/74 | HR 76 | Temp 98.2°F | Ht 61.0 in | Wt 231.5 lb

## 2020-10-03 DIAGNOSIS — K7460 Unspecified cirrhosis of liver: Secondary | ICD-10-CM | POA: Diagnosis not present

## 2020-10-03 DIAGNOSIS — K7581 Nonalcoholic steatohepatitis (NASH): Secondary | ICD-10-CM

## 2020-10-03 DIAGNOSIS — K5100 Ulcerative (chronic) pancolitis without complications: Secondary | ICD-10-CM | POA: Diagnosis not present

## 2020-10-03 DIAGNOSIS — K219 Gastro-esophageal reflux disease without esophagitis: Secondary | ICD-10-CM

## 2020-10-03 DIAGNOSIS — R197 Diarrhea, unspecified: Secondary | ICD-10-CM | POA: Diagnosis not present

## 2020-10-03 NOTE — Progress Notes (Addendum)
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  Database and subjective:  Patient is 75 year old Caucasian female who is here for scheduled visit.  She has several year history of pan ulcerative colitis.  She underwent colonoscopy in December 2020 by me.  She had very mild disease in proximal colon.  She has been maintained on infliximab for past few years.  She also has chronic GERD.  At one point she required 80 mg of Nexium daily.  Now she is only needing 20 mg daily. She has history of mildly elevated transaminases felt to be fatty liver.  She was evaluated for bariatric surgery by Dr. Lucia Gaskins and surgery was canceled at the last minute because of insurance change.  She was to have liver biopsy at the time of surgery but it never materialized. She was seen by Dr. Jenetta Downer on 08/02/2020 for fecal incontinence.  Because of history of fatty liver she underwent ultrasound with elastography.  Her median Score Was 6.8 Indicative of Some Degree of Fibrosis.  Ultrasound Also hypoechoic lesion within the liver felt to be indeterminate. She was further evaluated with MR on 08/19/2020 and no liver lesions were identified.  There was motion artifact.  Liver contour suggested cirrhosis. Finally she had liver protocol CT about 2 weeks ago and no focal abnormalities were noted.  She was noted to have cirrhotic morphology of the liver and splenomegaly but no ascites. She also had HCV antibody, hepatitis B surface antigen, hepatitis B surface antibody, hepatitis B core total antibody, HAV total antibody, AMA, ANA, smooth muscle antibody and all of these studies were negative or normal except HAV total antibody was positive implying immunity to hepatitis A. She is also screen for celiac disease and testing was negative.  She also underwent colonoscopy on 09/15/2020.  She was noted to have focal area with mild colitis in sigmoid colon confirmed with  biopsy.  Random biopsies from various segments of the colon revealed benign mucosa without dysplasia or active inflammation. Patient was begun and Colestid.  She states she only has been taking it once a day and now having 1-2 normal stools.  Occasionally she may have up to 3 stools.  She has noted blood on the tissue with a bowel movement.  She says she has had 3-4 episodes since her colonoscopy.  She has not experienced frank rectal bleeding or melena. Her appetite is good.  She has lost 3 pounds since her last visit of August 02, 2020. She says she is not able to do much physical activity on account of peripheral neuropathy.  Heartburn is well controlled with therapy.  She denies dysphagia nausea or vomiting.  She also denies abdominal pain.  Current Medications: Outpatient Encounter Medications as of 10/03/2020  Medication Sig  . ACCU-CHEK GUIDE test strip USE TO CHECK BLOOD GLUCOSE UP TO 6 TIMES DAILY AS DIRECTED  . Accu-Chek Softclix Lancets lancets USE TO CHECK BLOOD GLUCOSE UP TO 6 TIMES DAILY AS DIRECTED  . blood glucose meter kit and supplies KIT Use up to six times daily as directed. DX. E11.9  . citalopram (CELEXA) 20 MG tablet TAKE 1 TABLET BY MOUTH EVERY DAY  . colestipol (COLESTID) 1 g tablet Take 2 tablets (2 g total) by mouth daily.  . Cyanocobalamin (B-12) 5000 MCG CAPS Take 5,000 mcg by mouth daily.  Marland Kitchen esomeprazole (NEXIUM) 20 MG capsule Take 20 mg by mouth daily.   . ferrous sulfate 325 (65 FE) MG  tablet Take 1 tablet (325 mg total) by mouth 2 (two) times daily with a meal. (Patient taking differently: Take 325 mg by mouth daily.)  . furosemide (LASIX) 40 MG tablet TAKE 1 TABLET BY MOUTH EVERY DAY  . gabapentin (NEURONTIN) 600 MG tablet TAKE 1 AND 1/2 TABLETS BY MOUTH TWICE A DAY  . inFLIXimab (REMICADE) 100 MG injection Inject 100 mg into the vein every 8 (eight) weeks.   . Insulin Pen Needle (B-D UF III MINI PEN NEEDLES) 31G X 5 MM MISC USE TO INJECT INSULINS EQUAL TO 6 TIMES  DAILY.  Marland Kitchen insulin regular human CONCENTRATED (HUMULIN R U-500 KWIKPEN) 500 UNIT/ML kwikpen Inject 125-135 Units into the skin See admin instructions. Inject 125 units with breakfast, 135 units with lunch, and 125 units with supper.  Marland Kitchen KLOR-CON M10 10 MEQ tablet TAKE 1 TABLET BY MOUTH EVERY DAY  . lactase (LACTAID) 3000 UNITS tablet Take 3,000 Units by mouth as needed (when eating foods containing dairy).   . meclizine (ANTIVERT) 25 MG tablet Take 1 tablet (25 mg total) by mouth 3 (three) times daily as needed for dizziness or nausea.  . metoprolol succinate (TOPROL-XL) 100 MG 24 hr tablet TAKE 1 TABLET BY MOUTH 2 TIMES DAILY. TAKE WITH OR IMMEDIATELY FOLLOWING A MEAL  . Multiple Vitamin (MULTIVITAMIN WITH MINERALS) TABS tablet Take 1 tablet by mouth daily.  . Polyvinyl Alcohol-Povidone (REFRESH OP) Place 1 drop into both eyes daily as needed (dry eyes).  . TRULICITY 1.5 PY/0.9XI SOPN INJECT UNDER SKIN 1.5 MG WEEKLY (Patient taking differently: Inject 1.5 mg as directed every Thursday.)  . XARELTO 20 MG TABS tablet TAKE 1 TABLET (20 MG TOTAL) BY MOUTH DAILY WITH SUPPER.  Marland Kitchen acetaminophen (TYLENOL) 325 MG tablet Take 650 mg by mouth every 6 (six) hours as needed for moderate pain or headache. (Patient not taking: Reported on 10/03/2020)  . loperamide (IMODIUM) 2 MG capsule Take 2-4 mg by mouth as needed for diarrhea or loose stools.  (Patient not taking: Reported on 10/03/2020)  . [DISCONTINUED] amitriptyline (ELAVIL) 25 MG tablet Take 1 tablet (25 mg total) by mouth at bedtime. start with 1/2 tab at night for 1 week then increase to 1 whole tab.  . [DISCONTINUED] bromocriptine (PARLODEL) 2.5 MG tablet Take 2.5 mg by mouth 2 (two) times daily.    . [DISCONTINUED] potassium chloride (K-DUR) 10 MEQ tablet Take 2 tablets (20 mEq total) by mouth daily.   No facility-administered encounter medications on file as of 10/03/2020.     Objective: Blood pressure 132/74, pulse 76, temperature 98.2 F (36.8 C),  temperature source Oral, height _0  (1.549 m), weight 231 lb 8 oz (105 kg). Patient is alert and in no acute distress. Conjunctiva is pink. Sclera is nonicteric Oropharyngeal mucosa is normal. No neck masses or thyromegaly noted. Cardiac exam with regular rhythm normal S1 and S2. No murmur or gallop noted. Lungs are clear to auscultation. Abdomen is full but soft and nontender without organomegaly or masses. She has pitting and nonpitting edema to both legs.  Labs/studies Results:  CBC Latest Ref Rng & Units 08/18/2020 04/18/2020 01/25/2020  WBC 4.0 - 10.5 K/uL 4.1 5.4 -  Hemoglobin 12.0 - 15.0 g/dL 12.5 13.4 9.8(L)  Hematocrit 36.0 - 46.0 % 36.6 38.7 31.1(L)  Platelets 150.0 - 400.0 K/uL 92.0(L) 108.0(L) -    CMP Latest Ref Rng & Units 09/20/2020 08/18/2020 04/18/2020  Glucose 70 - 99 mg/dL - 248(H) 76  BUN 6 - 23 mg/dL - 11  7  Creatinine 0.44 - 1.00 mg/dL 0.60 0.58 0.59  Sodium 135 - 145 mEq/L - 138 141  Potassium 3.5 - 5.1 mEq/L - 4.3 3.6  Chloride 96 - 112 mEq/L - 102 103  CO2 19 - 32 mEq/L - 29 32  Calcium 8.4 - 10.5 mg/dL - 8.4 8.8  Total Protein 6.0 - 8.3 g/dL - 6.4 6.9  Total Bilirubin 0.2 - 1.2 mg/dL - 0.9 0.7  Alkaline Phos 39 - 117 U/L - 73 67  AST 0 - 37 U/L - 46(H) 54(H)  ALT 0 - 35 U/L - 38(H) 44(H)    Hepatic Function Latest Ref Rng & Units 08/18/2020 04/18/2020 01/13/2020  Total Protein 6.0 - 8.3 g/dL 6.4 6.9 6.8  Albumin 3.5 - 5.2 g/dL 3.2(L) 3.5 -  AST 0 - 37 U/L 46(H) 54(H) 32  ALT 0 - 35 U/L 38(H) 44(H) 21  Alk Phosphatase 39 - 117 U/L 73 67 -  Total Bilirubin 0.2 - 1.2 mg/dL 0.9 0.7 0.5  Bilirubin, Direct 0.0 - 0.3 mg/dL - - -    Assessment:  #1.  Chronic ulcerative colitis.  Recent colonoscopy revealed very localized colitis in distal sigmoid colon.  She is not having any symptoms pertaining to this perhaps other than hematochezia which may be hemorrhoidal.  If hematochezia continues may consider topical therapy with mesalamine for few weeks.  For now  she will continue infliximab infusion every 8 weeks.  #2.  History of iron deficiency anemia.  She has responded to iron therapy.  Anemia is primarily due to impaired iron absorption in setting of chronic acid suppression.  She had a EGD in December 2020 as well.  #3.  Diarrhea.  She has responded to Colestid.  In the past I felt her diarrhea is due to diabetes or she has had an element of IBS.  She will continue Colestid as long as it is working.  She knows not to take it with other medications or soon after taking other medications.  She should wait at least 2 hours if not full.  #4.  Cirrhosis secondary to NASH.  She is immune to hepatitis A.  She may have had hepatitis B vaccine several years ago but she is not sure.  Therefore she needs to talk with Dr. Vicente Serene about getting hepatitis B vaccination otherwise we will send her to the [facility] health clinic.  She has well-preserved hepatic function but she is definitely at risk for progressive disease given underlying diabetes and virtually no physical activity.  She has insulins resistance as she is requiring such a high dose. If she is going to have bariatric surgery now is the time when her Pugh score is low and and as a result risk is low.  #4.  Diabetes mellitus.  She is having hypoglycemic spells.  She will contact Dr. Hampton Abbot office regarding insulin dose.   Plan:  Fecal calprotectin in 1 month. Patient advised to contact Dr. Idelle Leech office regarding hypoglycemic spells. Patient advised not to eat raw fish now that she is confirmed to have cirrhosis based on imaging studies. Hepatitis B vaccination. Office visit in 6 months.  Addendum created on 10/12/2020 Lab data from 10/11/2020 from Dr. Melissa Noon office reviewed. Patient called and message left on answering service  H&H is 13.3 and 38.2. Platelet count is 90 K. Bilirubin 0.6, AP 80, AST 48 and ALT 44. Albumin is 3.9.

## 2020-10-03 NOTE — Patient Instructions (Addendum)
You will need Hepatitis  B vaccine. Remember you cannot eat raw fish.

## 2020-10-04 DIAGNOSIS — G4733 Obstructive sleep apnea (adult) (pediatric): Secondary | ICD-10-CM | POA: Diagnosis not present

## 2020-10-04 NOTE — Progress Notes (Incomplete)
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  Database and subjective:  Patient is 74 year old Caucasian female who is here for scheduled visit.  She has several year history of pan ulcerative colitis.  She underwent colonoscopy in December 2020 by me.  She had very mild disease in proximal colon.  She has been maintained on infliximab for past few years.  She also has chronic GERD.  At one point she required 80 mg of Nexium daily.  Now she is only needing 20 mg daily. She has history of mildly elevated transaminases felt to be fatty liver.  She was evaluated for bariatric surgery by Dr. Lucia Gaskins and surgery was canceled at the last minute because of insurance change.  She was to have liver biopsy at the time of surgery but it never materialized. She was seen by Dr. Jenetta Downer on 08/02/2020 for fecal incontinence.  Because of history of fatty liver she underwent ultrasound with elastography.  Her median Score Was 6.8 Indicative of Some Degree of Fibrosis.  Ultrasound Also hypoechoic lesion within the liver felt to be indeterminate. She was further evaluated with MR on 08/19/2020 and no liver lesions were identified.  There was motion artifact.  Liver contour suggested cirrhosis. Finally she had liver protocol CT about 2 weeks ago and no focal abnormalities were noted.  She was noted to have cirrhotic morphology of the liver and splenomegaly but no ascites. She also had HCV antibody, hepatitis B surface antigen, hepatitis B surface antibody, hepatitis B core total antibody, HAV total antibody, AMA, ANA, smooth muscle antibody               Current Medications: Outpatient Encounter Medications as of 10/03/2020  Medication Sig  . ACCU-CHEK GUIDE test strip USE TO CHECK BLOOD GLUCOSE UP TO 6 TIMES DAILY AS DIRECTED  . Accu-Chek Softclix Lancets lancets USE TO CHECK BLOOD GLUCOSE UP TO 6 TIMES DAILY AS DIRECTED  . blood glucose  meter kit and supplies KIT Use up to six times daily as directed. DX. E11.9  . citalopram (CELEXA) 20 MG tablet TAKE 1 TABLET BY MOUTH EVERY DAY  . colestipol (COLESTID) 1 g tablet Take 2 tablets (2 g total) by mouth daily.  . Cyanocobalamin (B-12) 5000 MCG CAPS Take 5,000 mcg by mouth daily.  Marland Kitchen esomeprazole (NEXIUM) 20 MG capsule Take 20 mg by mouth daily.   . ferrous sulfate 325 (65 FE) MG tablet Take 1 tablet (325 mg total) by mouth 2 (two) times daily with a meal. (Patient taking differently: Take 325 mg by mouth daily.)  . furosemide (LASIX) 40 MG tablet TAKE 1 TABLET BY MOUTH EVERY DAY  . gabapentin (NEURONTIN) 600 MG tablet TAKE 1 AND 1/2 TABLETS BY MOUTH TWICE A DAY  . inFLIXimab (REMICADE) 100 MG injection Inject 100 mg into the vein every 8 (eight) weeks.   . Insulin Pen Needle (B-D UF III MINI PEN NEEDLES) 31G X 5 MM MISC USE TO INJECT INSULINS EQUAL TO 6 TIMES DAILY.  Marland Kitchen insulin regular human CONCENTRATED (HUMULIN R U-500 KWIKPEN) 500 UNIT/ML kwikpen Inject 125-135 Units into the skin See admin instructions. Inject 125 units with breakfast, 135 units with lunch, and 125 units with supper.  Marland Kitchen KLOR-CON M10 10 MEQ tablet TAKE 1 TABLET BY MOUTH EVERY DAY  . lactase (LACTAID) 3000 UNITS tablet Take 3,000 Units by mouth as needed (when eating foods containing dairy).   . meclizine (ANTIVERT) 25 MG tablet Take 1  tablet (25 mg total) by mouth 3 (three) times daily as needed for dizziness or nausea.  . metoprolol succinate (TOPROL-XL) 100 MG 24 hr tablet TAKE 1 TABLET BY MOUTH 2 TIMES DAILY. TAKE WITH OR IMMEDIATELY FOLLOWING A MEAL  . Multiple Vitamin (MULTIVITAMIN WITH MINERALS) TABS tablet Take 1 tablet by mouth daily.  . Polyvinyl Alcohol-Povidone (REFRESH OP) Place 1 drop into both eyes daily as needed (dry eyes).  . TRULICITY 1.5 ZJ/6.7HA SOPN INJECT UNDER SKIN 1.5 MG WEEKLY (Patient taking differently: Inject 1.5 mg as directed every Thursday.)  . XARELTO 20 MG TABS tablet TAKE 1 TABLET  (20 MG TOTAL) BY MOUTH DAILY WITH SUPPER.  Marland Kitchen acetaminophen (TYLENOL) 325 MG tablet Take 650 mg by mouth every 6 (six) hours as needed for moderate pain or headache. (Patient not taking: Reported on 10/03/2020)  . loperamide (IMODIUM) 2 MG capsule Take 2-4 mg by mouth as needed for diarrhea or loose stools.  (Patient not taking: Reported on 10/03/2020)  . [DISCONTINUED] amitriptyline (ELAVIL) 25 MG tablet Take 1 tablet (25 mg total) by mouth at bedtime. start with 1/2 tab at night for 1 week then increase to 1 whole tab.  . [DISCONTINUED] bromocriptine (PARLODEL) 2.5 MG tablet Take 2.5 mg by mouth 2 (two) times daily.    . [DISCONTINUED] potassium chloride (K-DUR) 10 MEQ tablet Take 2 tablets (20 mEq total) by mouth daily.   No facility-administered encounter medications on file as of 10/03/2020.   ***  Objective: Blood pressure 132/74, pulse 76, temperature 98.2 F (36.8 C), temperature source Oral, height 5' 1"  (1.549 m), weight 231 lb 8 oz (105 kg). *** Conjunctiva is pink. Sclera is nonicteric Oropharyngeal mucosa is normal. No neck masses or thyromegaly noted. Cardiac exam with regular rhythm normal S1 and S2. No murmur or gallop noted. Lungs are clear to auscultation. Abdomen;  No LE edema or clubbing noted.  Labs/studies Results:  CBC Latest Ref Rng & Units 08/18/2020 04/18/2020 01/25/2020  WBC 4.0 - 10.5 K/uL 4.1 5.4 -  Hemoglobin 12.0 - 15.0 g/dL 12.5 13.4 9.8(L)  Hematocrit 36.0 - 46.0 % 36.6 38.7 31.1(L)  Platelets 150.0 - 400.0 K/uL 92.0(L) 108.0(L) -    CMP Latest Ref Rng & Units 09/20/2020 08/18/2020 04/18/2020  Glucose 70 - 99 mg/dL - 248(H) 76  BUN 6 - 23 mg/dL - 11 7  Creatinine 0.44 - 1.00 mg/dL 0.60 0.58 0.59  Sodium 135 - 145 mEq/L - 138 141  Potassium 3.5 - 5.1 mEq/L - 4.3 3.6  Chloride 96 - 112 mEq/L - 102 103  CO2 19 - 32 mEq/L - 29 32  Calcium 8.4 - 10.5 mg/dL - 8.4 8.8  Total Protein 6.0 - 8.3 g/dL - 6.4 6.9  Total Bilirubin 0.2 - 1.2 mg/dL - 0.9 0.7  Alkaline  Phos 39 - 117 U/L - 73 67  AST 0 - 37 U/L - 46(H) 54(H)  ALT 0 - 35 U/L - 38(H) 44(H)    Hepatic Function Latest Ref Rng & Units 08/18/2020 04/18/2020 01/13/2020  Total Protein 6.0 - 8.3 g/dL 6.4 6.9 6.8  Albumin 3.5 - 5.2 g/dL 3.2(L) 3.5 -  AST 0 - 37 U/L 46(H) 54(H) 32  ALT 0 - 35 U/L 38(H) 44(H) 21  Alk Phosphatase 39 - 117 U/L 73 67 -  Total Bilirubin 0.2 - 1.2 mg/dL 0.9 0.7 0.5  Bilirubin, Direct 0.0 - 0.3 mg/dL - - -    Lab Results  Component Value Date   CRP <0.5 03/04/2014  Assessment:  #1. #2. #3. #4.   Plan:  ***    Presenting complaint;  *** Subjective:  ***  Current Medications: Outpatient Encounter Medications as of 10/03/2020  Medication Sig  . ACCU-CHEK GUIDE test strip USE TO CHECK BLOOD GLUCOSE UP TO 6 TIMES DAILY AS DIRECTED  . Accu-Chek Softclix Lancets lancets USE TO CHECK BLOOD GLUCOSE UP TO 6 TIMES DAILY AS DIRECTED  . blood glucose meter kit and supplies KIT Use up to six times daily as directed. DX. E11.9  . citalopram (CELEXA) 20 MG tablet TAKE 1 TABLET BY MOUTH EVERY DAY  . colestipol (COLESTID) 1 g tablet Take 2 tablets (2 g total) by mouth daily.  . Cyanocobalamin (B-12) 5000 MCG CAPS Take 5,000 mcg by mouth daily.  Marland Kitchen esomeprazole (NEXIUM) 20 MG capsule Take 20 mg by mouth daily.   . ferrous sulfate 325 (65 FE) MG tablet Take 1 tablet (325 mg total) by mouth 2 (two) times daily with a meal. (Patient taking differently: Take 325 mg by mouth daily.)  . furosemide (LASIX) 40 MG tablet TAKE 1 TABLET BY MOUTH EVERY DAY  . gabapentin (NEURONTIN) 600 MG tablet TAKE 1 AND 1/2 TABLETS BY MOUTH TWICE A DAY  . inFLIXimab (REMICADE) 100 MG injection Inject 100 mg into the vein every 8 (eight) weeks.   . Insulin Pen Needle (B-D UF III MINI PEN NEEDLES) 31G X 5 MM MISC USE TO INJECT INSULINS EQUAL TO 6 TIMES DAILY.  Marland Kitchen insulin regular human CONCENTRATED (HUMULIN R U-500 KWIKPEN) 500 UNIT/ML kwikpen Inject 125-135 Units into the skin See admin  instructions. Inject 125 units with breakfast, 135 units with lunch, and 125 units with supper.  Marland Kitchen KLOR-CON M10 10 MEQ tablet TAKE 1 TABLET BY MOUTH EVERY DAY  . lactase (LACTAID) 3000 UNITS tablet Take 3,000 Units by mouth as needed (when eating foods containing dairy).   . meclizine (ANTIVERT) 25 MG tablet Take 1 tablet (25 mg total) by mouth 3 (three) times daily as needed for dizziness or nausea.  . metoprolol succinate (TOPROL-XL) 100 MG 24 hr tablet TAKE 1 TABLET BY MOUTH 2 TIMES DAILY. TAKE WITH OR IMMEDIATELY FOLLOWING A MEAL  . Multiple Vitamin (MULTIVITAMIN WITH MINERALS) TABS tablet Take 1 tablet by mouth daily.  . Polyvinyl Alcohol-Povidone (REFRESH OP) Place 1 drop into both eyes daily as needed (dry eyes).  . TRULICITY 1.5 ZY/2.4MG SOPN INJECT UNDER SKIN 1.5 MG WEEKLY (Patient taking differently: Inject 1.5 mg as directed every Thursday.)  . XARELTO 20 MG TABS tablet TAKE 1 TABLET (20 MG TOTAL) BY MOUTH DAILY WITH SUPPER.  Marland Kitchen acetaminophen (TYLENOL) 325 MG tablet Take 650 mg by mouth every 6 (six) hours as needed for moderate pain or headache. (Patient not taking: Reported on 10/03/2020)  . loperamide (IMODIUM) 2 MG capsule Take 2-4 mg by mouth as needed for diarrhea or loose stools.  (Patient not taking: Reported on 10/03/2020)  . [DISCONTINUED] amitriptyline (ELAVIL) 25 MG tablet Take 1 tablet (25 mg total) by mouth at bedtime. start with 1/2 tab at night for 1 week then increase to 1 whole tab.  . [DISCONTINUED] bromocriptine (PARLODEL) 2.5 MG tablet Take 2.5 mg by mouth 2 (two) times daily.    . [DISCONTINUED] potassium chloride (K-DUR) 10 MEQ tablet Take 2 tablets (20 mEq total) by mouth daily.   No facility-administered encounter medications on file as of 10/03/2020.   ***  Objective: Blood pressure 132/74, pulse 76, temperature 98.2 F (36.8 C), temperature source Oral, height  5' 1"  (1.549 m), weight 231 lb 8 oz (105 kg). *** Conjunctiva is pink. Sclera is  nonicteric Oropharyngeal mucosa is normal. No neck masses or thyromegaly noted. Cardiac exam with regular rhythm normal S1 and S2. No murmur or gallop noted. Lungs are clear to auscultation. Abdomen;  No LE edema or clubbing noted.  Labs/studies Results:  CBC Latest Ref Rng & Units 08/18/2020 04/18/2020 01/25/2020  WBC 4.0 - 10.5 K/uL 4.1 5.4 -  Hemoglobin 12.0 - 15.0 g/dL 12.5 13.4 9.8(L)  Hematocrit 36.0 - 46.0 % 36.6 38.7 31.1(L)  Platelets 150.0 - 400.0 K/uL 92.0(L) 108.0(L) -    CMP Latest Ref Rng & Units 09/20/2020 08/18/2020 04/18/2020  Glucose 70 - 99 mg/dL - 248(H) 76  BUN 6 - 23 mg/dL - 11 7  Creatinine 0.44 - 1.00 mg/dL 0.60 0.58 0.59  Sodium 135 - 145 mEq/L - 138 141  Potassium 3.5 - 5.1 mEq/L - 4.3 3.6  Chloride 96 - 112 mEq/L - 102 103  CO2 19 - 32 mEq/L - 29 32  Calcium 8.4 - 10.5 mg/dL - 8.4 8.8  Total Protein 6.0 - 8.3 g/dL - 6.4 6.9  Total Bilirubin 0.2 - 1.2 mg/dL - 0.9 0.7  Alkaline Phos 39 - 117 U/L - 73 67  AST 0 - 37 U/L - 46(H) 54(H)  ALT 0 - 35 U/L - 38(H) 44(H)    Hepatic Function Latest Ref Rng & Units 08/18/2020 04/18/2020 01/13/2020  Total Protein 6.0 - 8.3 g/dL 6.4 6.9 6.8  Albumin 3.5 - 5.2 g/dL 3.2(L) 3.5 -  AST 0 - 37 U/L 46(H) 54(H) 32  ALT 0 - 35 U/L 38(H) 44(H) 21  Alk Phosphatase 39 - 117 U/L 73 67 -  Total Bilirubin 0.2 - 1.2 mg/dL 0.9 0.7 0.5  Bilirubin, Direct 0.0 - 0.3 mg/dL - - -    Lab Results  Component Value Date   CRP <0.5 03/04/2014      Assessment:  #1. #2. #3. #4.   Plan:  ***

## 2020-10-09 ENCOUNTER — Encounter: Payer: Self-pay | Admitting: Pulmonary Disease

## 2020-10-09 ENCOUNTER — Other Ambulatory Visit: Payer: Self-pay

## 2020-10-09 ENCOUNTER — Ambulatory Visit: Payer: Medicare PPO | Admitting: Pulmonary Disease

## 2020-10-09 VITALS — BP 132/64 | HR 74 | Temp 97.7°F | Ht 62.0 in | Wt 230.8 lb

## 2020-10-09 DIAGNOSIS — G4733 Obstructive sleep apnea (adult) (pediatric): Secondary | ICD-10-CM | POA: Diagnosis not present

## 2020-10-09 NOTE — Patient Instructions (Addendum)
You were seen today by Lauraine Rinne, NP  for:   1. OSA (obstructive sleep apnea), severe  Continue CPAP therapy  Refill supplies for 1 year   As discussed today if you do become more interested in proceeding forward with an inspire device referral could consider provider such as Dr. Wilburn Cornelia in [place], or Dr. Jannifer Franklin or Dr. Jenell Milliner with [facility] ear nose [facility] throat  We recommend that you continue using your CPAP daily >>>Keep up the hard work using your device >>> Goal should be wearing this for the entire night that you are sleeping, at least 4 to 6 hours  Remember:  . Do not drive or operate heavy machinery if tired or drowsy.  . Please notify the supply company and office if you are unable to use your device regularly due to missing supplies or machine being broken.  . Work on maintaining a healthy weight and following your recommended nutrition plan  . Maintain proper daily exercise and movement  . Maintaining proper use of your device can also help improve management of other chronic illnesses such as: Blood pressure, blood sugars, and weight management.   BiPAP/ CPAP Cleaning:  >>>Clean weekly, with Dawn soap, and bottle brush.  Set up to air dry. >>> Wipe mask out daily with wet wipe or towelette    Follow Up:    Return in about 1 year (around 10/09/2021), or if symptoms worsen or fail to improve, for Follow up with Dr. Halford Chessman.   Notification of test results are managed in the following manner: If there are  any recommendations or changes to the  plan of care discussed in office today,  we will contact you and let you know what they are. If you do not hear from [place], then your results are normal and you can view them through your  MyChart account , or a letter will be sent to you. Thank you again for trusting [place] with your care  - Thank you, [facility]    It is flu season:   >>> Best ways to protect herself from the flu: Receive the yearly flu vaccine, practice good  hand hygiene washing with soap and also using hand sanitizer when available, eat a nutritious meals, get adequate rest, hydrate appropriately       Please contact the office if your symptoms worsen or you have concerns that you are not improving.   Thank you for choosing [facility] for your healthcare, and for allowing [place] to partner with you on your healthcare journey. I am thankful to be able to provide care to you today.   Wyn Quaker FNP-C

## 2020-10-09 NOTE — Progress Notes (Signed)
@Patient  ID: [name], female    DOB: 05-29-1946, 75 y.o.   MRN: [id number]  Chief Complaint  Patient presents with   Follow-up    Osa, denies problems    Referring provider: Tammi Sou, MD  HPI:  75 year old female never smoker [facility] for severe obstructive sleep apnea  PMH: Allergic rhinitis, dysphagia, hypertension, GERD, dyslipidemia, anxiety, insomnia, A. fib, uncontrolled type 2 diabetes Smoker/ Smoking History: Never smoker Maintenance:  None  Pt of: Dr. Halford Chessman  10/10/2020  - Visit   74 year old female never smoker phone office for severe obstructive sleep apnea.  Established with Dr. Halford Chessman.  Completing 1 year follow-up with her office today.  Patient reports compliance to her CPAP.  CPAP compliance report listed below:  09/09/2020 - 10/08/2020-CPAP compliance report-29 of the last 30 days used, 24 there stays above 4 hours, average usage 6 hours and 10 minutes, APAP setting 5-15, AHI 2.2  Patient reporting today she is interested in the inspire device.  We will discuss this.  Questionaires / Pulmonary Flowsheets:   ACT:  No flowsheet data found.  MMRC: No flowsheet data found.  Epworth:  No flowsheet data found.  Tests:   PSG 09/07/15 >> AHI 72, SpO2 low 75%  FENO:  No results found for: NITRICOXIDE  PFT: No flowsheet data found.  WALK:  No flowsheet data found.  Imaging: CT LIVER ABD W/WO  Result Date: 09/20/2020 CLINICAL DATA:  Liver lesion, cirrhosis chronic diarrhea, lower abdominal pain, positive Hemoccult EXAM: CT ABDOMEN WITHOUT AND WITH CONTRAST TECHNIQUE: Multidetector CT imaging of the abdomen was performed following the standard protocol before and following the bolus administration of intravenous contrast. CONTRAST:  126m OMNIPAQUE IOHEXOL 300 MG/ML SOLN, additional oral enteric contrast COMPARISON:  MR abdomen, 08/21/2020 FINDINGS: Lower chest: No acute abnormality. Hepatobiliary: Coarse, nodular contour of the liver. There  is no focal liver lesion or suspicious contrast enhancement identified, with specific attention to the left lobe. Pancreas: Unremarkable. No pancreatic ductal dilatation or surrounding inflammatory changes. Spleen: Splenomegaly, maximum span 14.7 cm. Adrenals/Urinary Tract: Adrenal glands are unremarkable. Kidneys are normal, without renal calculi, focal lesion, or hydronephrosis. Stomach/Bowel: Stomach is within normal limits. Appendix appears normal. No evidence of bowel wall thickening, distention, or inflammatory changes. Vascular/Lymphatic: Aortic atherosclerosis. No enlarged abdominal lymph nodes. Other: No abdominal wall hernia or abnormality. Musculoskeletal: No acute or significant osseous findings. IMPRESSION: 1. Cirrhotic morphology of the liver. No focal liver lesion or suspicious contrast enhancement identified on multiphasic contrast enhanced CT, with specific attention to the left lobe. 2. Splenomegaly, maximum span 14.7 cm. Aortic Atherosclerosis (ICD10-I70.0). Electronically Signed   By: AEddie CandleM.D.   On: 09/20/2020 15:10    Lab Results:  CBC    Component Value Date/Time   WBC 4.1 08/18/2020 1218   RBC 3.70 (L) 08/18/2020 1218   HGB 12.5 08/18/2020 1218   HCT 36.6 08/18/2020 1218   PLT 92.0 (L) 08/18/2020 1218   MCV 99.1 08/18/2020 1218   MCH 24.9 (L) 01/13/2020 1352   MCHC 34.2 08/18/2020 1218   RDW 13.8 08/18/2020 1218   LYMPHSABS 1.9 08/18/2020 1218   MONOABS 0.4 08/18/2020 1218   EOSABS 0.1 08/18/2020 1218   BASOSABS 0.0 08/18/2020 1218    BMET    Component Value Date/Time   NA 138 08/18/2020 1218   K 4.3 08/18/2020 1218   CL 102 08/18/2020 1218   CO2 29 08/18/2020 1218   GLUCOSE 248 (H) 08/18/2020 1218   BUN 11  08/18/2020 1218   CREATININE 0.60 09/20/2020 1436   CREATININE 0.71 01/13/2020 1352   CALCIUM 8.4 08/18/2020 1218   GFRNONAA >60 08/13/2016 1114   GFRAA >60 08/13/2016 1114    BNP No results found for: BNP  ProBNP    Component Value  Date/Time   PROBNP 108.7 01/16/2012 0551    Specialty Problems      Pulmonary Problems   Allergic sinusitis   OSA (obstructive sleep apnea), severe    sleep study 09/07/15: severe obstructive sleep apnea with an AHI of 72 and SaO2 low of 75%.>refer to sleep med for eval and tx options      Epistaxis, recurrent   Deviated septum      Allergies  Allergen Reactions   Omeprazole Anaphylaxis and Swelling    SWELLING OF TONGUE AND THROAT   Actos [Pioglitazone] Other (See Comments)    Weight gain   Benzocaine-Menthol Swelling    SWELLING OF MOUTH   Colesevelam Other (See Comments)    GI UPSET   Flagyl [Metronidazole Hcl] Other (See Comments)    DIAPHORESIS   Metformin And Related Diarrhea   Shrimp [Shellfish Allergy] Itching    OF THROAT AND EARS   Statins Palpitations   Desipramine Hcl Itching, Nausea Only and Other (See Comments)    "swimmy" headed, ears itched    Hydromorphone Itching   Jardiance [Empagliflozin] Other (See Comments)    weakness   Lactose Intolerance (Gi) Diarrhea    Gas, bloating   Adhesive [Tape] Other (See Comments)    SKIN IRRITATION AND BRUISING   Nisoldipine Itching   Percocet [Oxycodone-Acetaminophen] Itching    Immunization History  Administered Date(s) Administered   Fluad Quad(high Dose 65+) 08/18/2019, 08/18/2020   Influenza Split 08/17/2012   Influenza Whole 06/30/2009, 07/12/2010   Influenza, High Dose Seasonal PF 08/29/2015, 09/09/2016, 08/13/2017, 06/29/2018   Influenza,inj,Quad PF,6+ Mos 06/08/2013, 08/18/2014   Moderna Sars-Covid-2 Vaccination 12/07/2019, 01/11/2020   Pneumococcal Conjugate-13 08/18/2014   Pneumococcal Polysaccharide-23 11/26/2016   Td 09/30/2008   Tdap 04/16/2019   Zoster 08/17/2012    Past Medical History:  Diagnosis Date   Anxiety    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) 80/0349   NASH--> early cirrhotic changes on  ultrasound 07/2018. ? to get liver bx if she gets bariatric surgery? Mild portal hypertensive gastropathy on EGD 08/2019.   Complication of anesthesia    Fibromyalgia    GERD    History of thrombocytopenia 12/2011   Hyperlipidemia    Intolerant of statins   HYPERTENSION    IBS (irritable bowel syndrome)    -D.  Good response to bentyl and imodium as of 06/2018 GI f/u.   IDA (iron deficiency anemia) 12/2018   Possibly inadequate absorption secondary to chronic/long term PPI therapy. No overt GI bleding. Endoscopic w/u including givens unrevealing. Dr. Laural [name] suspects poor iron absorption.  Oral iron restarted 12/2019.     IDDM (insulin dependent diabetes mellitus)    with DPN (managed by Dr. Cruzita Lederer but then in 2018 pt preferred to have me manage for her convenience)   Limited mobility    Requires a walker for arthritic pain, widespread musculoskeletal pain, and neuropathic pain.   Morbid obesity ([place])    As of 11/2018, pt considering sleeve gastectomy vs bipass as of eval by Dr. De Burrs considering as of 12/2019.   Nonalcoholic steatohepatitis    Viral Hep screens NEG.  CT 2015.  Transaminasemia.  U/S 07/2018 showed  early changes of cirrhosis.   OSA (obstructive sleep apnea) 09/14/2015   sleep study 09/07/15: severe obstructive sleep apnea with an AHI of 72 and SaO2 low of 75%.>refer to sleep med for eval and tx options   Osteoarthritis    hips, shoulders, knees   PAF (paroxysmal atrial fibrillation) ([place])    One documented episode (after getting EGD 2016).  Was on amiodarone x 3 mo.  Rate control with metoprolol + anticoag with xarelto.    PONV (postoperative nausea and vomiting)    Recurrent epistaxis    Granuloma in L nare cauterized by ENT 04/2020. Another cautery 06/2020   Small fiber neuropathy    Due to DM.  Symmetric hands and feet tingling/numbness.   Ulcerative colitis ([place])    Remicade infusion Q 8 weeks: in clinical and endoscopic remission as of 12/2018  GI f/u.  06/11/19 rpt colonoscopy->cecal and ascending colon colitis.    Tobacco History: Social History   Tobacco Use  Smoking Status Never Smoker  Smokeless Tobacco Never Used   Counseling given: Not Answered   Continue to not smoke  Outpatient Encounter Medications as of 10/09/2020  Medication Sig   ACCU-CHEK GUIDE test strip USE TO CHECK BLOOD GLUCOSE UP TO 6 TIMES DAILY AS DIRECTED   Accu-Chek Softclix Lancets lancets USE TO CHECK BLOOD GLUCOSE UP TO 6 TIMES DAILY AS DIRECTED   acetaminophen (TYLENOL) 325 MG tablet Take 650 mg by mouth every 6 (six) hours as needed for moderate pain or headache.   blood glucose meter kit and supplies KIT Use up to six times daily as directed. DX. E11.9   citalopram (CELEXA) 20 MG tablet TAKE 1 TABLET BY MOUTH EVERY DAY   colestipol (COLESTID) 1 g tablet Take 2 tablets (2 g total) by mouth daily.   Cyanocobalamin (B-12) 5000 MCG CAPS Take 5,000 mcg by mouth daily.   esomeprazole (NEXIUM) 20 MG capsule Take 20 mg by mouth daily.    ferrous sulfate 325 (65 FE) MG tablet Take 1 tablet (325 mg total) by mouth 2 (two) times daily with a meal. (Patient taking differently: Take 325 mg by mouth daily.)   furosemide (LASIX) 40 MG tablet TAKE 1 TABLET BY MOUTH EVERY DAY   gabapentin (NEURONTIN) 600 MG tablet TAKE 1 AND 1/2 TABLETS BY MOUTH TWICE A DAY   inFLIXimab (REMICADE) 100 MG injection Inject 100 mg into the vein every 8 (eight) weeks.    Insulin Pen Needle (B-D UF III MINI PEN NEEDLES) 31G X 5 MM MISC USE TO INJECT INSULINS EQUAL TO 6 TIMES DAILY.   insulin regular human CONCENTRATED (HUMULIN R U-500 KWIKPEN) 500 UNIT/ML kwikpen Inject 125-135 Units into the skin See admin instructions. Inject 125 units with breakfast, 135 units with lunch, and 125 units with supper.   KLOR-CON M10 10 MEQ tablet TAKE 1 TABLET BY MOUTH EVERY DAY   lactase (LACTAID) 3000 UNITS tablet Take 3,000 Units by mouth as needed (when eating foods containing  dairy).    meclizine (ANTIVERT) 25 MG tablet Take 1 tablet (25 mg total) by mouth 3 (three) times daily as needed for dizziness or nausea.   metoprolol succinate (TOPROL-XL) 100 MG 24 hr tablet TAKE 1 TABLET BY MOUTH 2 TIMES DAILY. TAKE WITH OR IMMEDIATELY FOLLOWING A MEAL   Multiple Vitamin (MULTIVITAMIN WITH MINERALS) TABS tablet Take 1 tablet by mouth daily.   Polyvinyl Alcohol-Povidone (REFRESH OP) Place 1 drop into both eyes daily as needed (dry eyes).   TRULICITY 1.5 QJ/1.9ER SOPN  INJECT UNDER SKIN 1.5 MG WEEKLY (Patient taking differently: Inject 1.5 mg as directed every Thursday.)   XARELTO 20 MG TABS tablet TAKE 1 TABLET (20 MG TOTAL) BY MOUTH DAILY WITH SUPPER.   [DISCONTINUED] amitriptyline (ELAVIL) 25 MG tablet Take 1 tablet (25 mg total) by mouth at bedtime. start with 1/2 tab at night for 1 week then increase to 1 whole tab.   [DISCONTINUED] bromocriptine (PARLODEL) 2.5 MG tablet Take 2.5 mg by mouth 2 (two) times daily.     [DISCONTINUED] potassium chloride (K-DUR) 10 MEQ tablet Take 2 tablets (20 mEq total) by mouth daily.   No facility-administered encounter medications on file as of 10/09/2020.     Review of Systems  Review of Systems  Constitutional: Negative for activity change, fatigue and fever.  HENT: Negative for sinus pressure, sinus pain and sore throat.   Respiratory: Negative for cough, shortness of breath and wheezing.   Cardiovascular: Negative for chest pain and palpitations.  Musculoskeletal: Negative for arthralgias.  Neurological: Negative for dizziness.  Psychiatric/Behavioral: Negative for sleep disturbance. The patient is not nervous/anxious.      Physical Exam  BP 132/64 (BP Location: Left Wrist, Cuff Size: Normal)    Pulse 74    Temp 97.7 F (36.5 C) (Skin)    Ht 5' 2"  (1.575 m)    Wt 230 lb 12.8 oz (104.7 kg)    SpO2 95%    BMI 42.21 kg/m   Wt Readings from Last 5 Encounters:  10/09/20 230 lb 12.8 oz (104.7 kg)  10/03/20 231 lb 8 oz  (105 kg)  09/13/20 242 lb 15.2 oz (110.2 kg)  08/18/20 236 lb 3.2 oz (107.1 kg)  08/10/20 231 lb 3.2 oz (104.9 kg)    BMI Readings from Last 5 Encounters:  10/09/20 42.21 kg/m  10/03/20 43.74 kg/m  09/13/20 44.44 kg/m  08/18/20 45.00 kg/m  08/10/20 42.29 kg/m     Physical Exam Vitals and nursing note reviewed.  Constitutional:      General: She is not in acute distress.    Appearance: Normal appearance. She is obese.  HENT:     Head: Normocephalic and atraumatic.     Right Ear: Tympanic membrane, ear canal and external ear normal. There is no impacted cerumen.     Left Ear: Tympanic membrane, ear canal and external ear normal. There is no impacted cerumen.     Nose: Nose normal. No congestion.     Mouth/Throat:     Mouth: Mucous membranes are moist.     Pharynx: Oropharynx is clear.  Eyes:     Pupils: Pupils are equal, round, and reactive to light.  Cardiovascular:     Rate and Rhythm: Normal rate and regular rhythm.     Pulses: Normal pulses.     Heart sounds: Normal heart sounds. No murmur heard.   Pulmonary:     Effort: Pulmonary effort is normal. No respiratory distress.     Breath sounds: Normal breath sounds. No decreased air movement. No decreased breath sounds, wheezing or rales.  Musculoskeletal:     Cervical back: Normal range of motion.  Skin:    General: Skin is warm and dry.     Capillary Refill: Capillary refill takes less than 2 seconds.  Neurological:     General: No focal deficit present.     Mental Status: She is alert and oriented to person, place, and time. Mental status is at baseline.     Gait: Gait normal.  Psychiatric:  Mood and Affect: Mood normal.        Behavior: Behavior normal.        Thought Content: Thought content normal.        Judgment: Judgment normal.       Assessment & Plan:   OSA (obstructive sleep apnea), severe Severe obstructive sleep apnea Well-controlled on CPAP therapy Discussed other treatment  options such as a inspire device  Discussion: Patient would like to think about a referral to an ENT.  She is open to a referral to either Dr. Wilburn Cornelia, Dr. Jenell Milliner, or Dr. Jannifer Franklin.  We reviewed these today.  These are all approved inspire device providers.  Patient is aware that she needs to work on lowering her BMI  Plan: Continue CPAP therapy Follow-up in 1 year Refill supplies for 1 year Notify our office if you become interested in a referral to ENT    Return in about 1 year (around 10/09/2021), or if symptoms worsen or fail to improve, for Follow up with Dr. Halford Chessman.   Jennifer Rinne, [name] 10/10/2020   This appointment required 25 minutes of patient care (this includes precharting, chart review, review of results, face-to-face care, etc.).

## 2020-10-10 ENCOUNTER — Other Ambulatory Visit: Payer: Medicare PPO

## 2020-10-10 NOTE — Progress Notes (Signed)
Reviewed and agree with assessment/plan.   Chesley Mires, MD [facility] Pulmonary/Critical Care 10/10/2020, 9:53 AM Pager:  [phone]

## 2020-10-10 NOTE — Assessment & Plan Note (Signed)
Severe obstructive sleep apnea Well-controlled on CPAP therapy Discussed other treatment options such as a inspire device  Discussion: Patient would like to think about a referral to an ENT.  She is open to a referral to either Dr. Wilburn Cornelia, Dr. Jenell Milliner, or Dr. Jannifer Franklin.  We reviewed these today.  These are all approved inspire device providers.  Patient is aware that she needs to work on lowering her BMI  Plan: Continue CPAP therapy Follow-up in 1 year Refill supplies for 1 year Notify our office if you become interested in a referral to ENT

## 2020-10-11 DIAGNOSIS — K5180 Other ulcerative colitis without complications: Secondary | ICD-10-CM | POA: Diagnosis not present

## 2020-10-11 DIAGNOSIS — Z79899 Other long term (current) drug therapy: Secondary | ICD-10-CM | POA: Diagnosis not present

## 2020-10-11 LAB — HEPATIC FUNCTION PANEL
ALT: 44 — AB (ref 7–35)
AST: 48 — AB (ref 13–35)
Alkaline Phosphatase: 80 (ref 25–125)
Bilirubin, Total: 0.6

## 2020-10-11 LAB — CBC AND DIFFERENTIAL
HCT: 38 (ref 36–46)
Hemoglobin: 13.3 (ref 12.0–16.0)
Neutrophils Absolute: 1.9
Platelets: 90 — AB (ref 150–399)
WBC: 4.6

## 2020-10-11 LAB — COMPREHENSIVE METABOLIC PANEL
Albumin: 3.9 (ref 3.5–5.0)
Calcium: 9.2 (ref 8.7–10.7)
GFR calc Af Amer: 91
GFR calc non Af Amer: 79
Globulin: 3.9

## 2020-10-11 LAB — BASIC METABOLIC PANEL
BUN: 11 (ref 4–21)
CO2: 18 (ref 13–22)
Chloride: 106 (ref 99–108)
Creatinine: 0.8 (ref 0.5–1.1)
Glucose: 306
Potassium: 4.3 (ref 3.4–5.3)
Sodium: 144 (ref 137–147)

## 2020-10-11 LAB — CBC: RBC: 3.9 (ref 3.87–5.11)

## 2020-10-14 ENCOUNTER — Other Ambulatory Visit: Payer: Self-pay | Admitting: Family Medicine

## 2020-10-26 ENCOUNTER — Encounter: Payer: Self-pay | Admitting: Family Medicine

## 2020-10-26 ENCOUNTER — Ambulatory Visit: Payer: Medicare PPO | Admitting: Family Medicine

## 2020-10-26 ENCOUNTER — Other Ambulatory Visit: Payer: Self-pay

## 2020-10-26 VITALS — BP 127/67 | HR 72 | Temp 97.9°F | Resp 16 | Ht 62.0 in | Wt 233.8 lb

## 2020-10-26 DIAGNOSIS — Z862 Personal history of diseases of the blood and blood-forming organs and certain disorders involving the immune mechanism: Secondary | ICD-10-CM | POA: Diagnosis not present

## 2020-10-26 DIAGNOSIS — Z23 Encounter for immunization: Secondary | ICD-10-CM | POA: Diagnosis not present

## 2020-10-26 DIAGNOSIS — K7460 Unspecified cirrhosis of liver: Secondary | ICD-10-CM

## 2020-10-26 DIAGNOSIS — I10 Essential (primary) hypertension: Secondary | ICD-10-CM

## 2020-10-26 DIAGNOSIS — E1149 Type 2 diabetes mellitus with other diabetic neurological complication: Secondary | ICD-10-CM | POA: Diagnosis not present

## 2020-10-26 NOTE — Progress Notes (Signed)
OFFICE VISIT  10/26/2020  CC:  Chief Complaint  Patient presents with  . Follow-up    DM, HTN. Pt is not fasting   HPI:    Patient is a 75 y.o. Caucasian female who presents for f/u DM and HTN. I last saw her approx 2 mo ago. A/P as of that visit: "1) DM 2, not well controlled.  Hba1c and urine microalb/cr today. Instructions: Increase your U-500 insulin to 130 Units at breakfast, 140 Units at lunch, and 130 Units at supper. Decrease your U-500 at bedtime to 10 Units and take this ONLY IF YOUR GLUCOSE IS >200.  2) HTN, not well controlled. Increase toprol xl to 1 and 1/2 of the 100 mg tabs once a day. I recommended she restart CPAP (she had stopped this b/c of worries that it was contributing to her nosebleeds) and this may help bp control some.  3) IDA; malabsorption suspected, + some contribution from chronic nosebleeds. Nosebleeds no longer an issue lately. She's been more compliant with oral iron lately. CBC w/iron studies today.  4) NAFLD: early cirrhosis changes (most recently on u/s RUQ 08/07/20), +portal HTN. On recent u/s a1.2 cm hypoechoic liver lesion was noted and this needs MRI f/u--Followed by GI. Hepatic panel today.  5) Ulcerative colitis: remicade per GI.   Chronic diarrhea, fecal incontinence episodes: ? From active colitis? GI to do colonoscopy in about a month.Marland Kitchen  6) PAF, chronic anticoagulation.  No sign of overt bleeding. Cont toprol xl for rate control, xarelto anticoag. Cont routine cardiology f/u. CBC monitoring today.  7) Preventative health care:  Shingrix rx to pharmacy. Cerv ca scr->hx of hysterectomy for benign dx, last pap normal 2012, no hx of abnormals, no further cerv ca screening indicated. She had pelvic exam 05/2020 by GYN ,Dr. Sabra Heck, no probs. Mammogram 03/2019 normal, Dr. Sabra Heck, rpt due. DEXA showed osteopenia 03/2019, per Dr. Sabra Heck, rpt 5 yrs."  INTERIM HX: Must have been miscommunication at the time of last visit b/c she  did not make any of the changes I outlined for insulin dosing last visit. States current regimen she is following is: U500 125 U w BF, 135U lunch , 125 supper, and she is not taking hs anymore b/c has noted ongoing nocturnal/fasting hypoglycemia when she gave prn hs dose.     Review of glucose log shows lots of variation in fastings: 50s-250 but many in 100s---NO pattern except that the persistent lows resolved when she stopped hs insulin (for the most part).  Not many mid-day checks but most of these are 250-350.  2H after supper avg 280 or so. She is on trulicity injection weekly as well.  HOME BP: 140/75 avg, occ diast in 60s.  She stopped monitoring this about 6 wks ago.  ROS: no fevers, no CP, no SOB, no wheezing, no cough, no dizziness, no HAs, no rashes, no melena/hematochezia.  No polyuria or polydipsia.  No myalgias or arthralgias.  No focal weakness, paresthesias, or tremors.  No acute vision or hearing abnormalities. No n/v or abd pain.  No palpitations.     Past Medical History:  Diagnosis Date  . Anxiety   . 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 ? to get liver bx if she gets bariatric surgery? Mild portal hypertensive gastropathy on EGD 08/2019.  Marland Kitchen Complication of anesthesia   . Fibromyalgia   . GERD   .  History of thrombocytopenia 12/2011  . Hyperlipidemia    Intolerant of statins  . HYPERTENSION   . IBS (irritable bowel syndrome)    -D.  Good response to bentyl and imodium as of 06/2018 GI f/u.  . IDA (iron deficiency anemia) 12/2018   Possibly inadequate absorption secondary to chronic/long term PPI therapy. No overt GI bleding. Endoscopic w/u including givens unrevealing. Dr. Laural [name] suspects poor iron absorption.  Oral iron restarted 12/2019.    Marland Kitchen IDDM (insulin dependent diabetes mellitus)    with DPN (managed by Dr. Cruzita Lederer but then in 2018 pt preferred to  have me manage for her convenience)  . Limited mobility    Requires a walker for arthritic pain, widespread musculoskeletal pain, and neuropathic pain.  . Morbid obesity ([place])    As of 11/2018, pt considering sleeve gastectomy vs bipass as of eval by Dr. De Burrs considering as of 12/2019.  Marland Kitchen Nonalcoholic steatohepatitis    Viral Hep screens NEG.  CT 2015.  Transaminasemia.  U/S 07/2018 showed early changes of cirrhosis.  . OSA (obstructive sleep apnea) 09/14/2015   sleep study 09/07/15: severe obstructive sleep apnea with an AHI of 72 and SaO2 low of 75%.>refer to sleep med for eval and tx options  . Osteoarthritis    hips, shoulders, knees  . PAF (paroxysmal atrial fibrillation) ([place])    One documented episode (after getting EGD 2016).  Was on amiodarone x 3 mo.  Rate control with metoprolol + anticoag with xarelto.   Marland Kitchen PONV (postoperative nausea and vomiting)   . Recurrent epistaxis    Granuloma in L nare cauterized by ENT 04/2020. Another cautery 06/2020  . Small fiber neuropathy    Due to DM.  Symmetric hands and feet tingling/numbness.  Marland Kitchen Ulcerative colitis ([place])    Remicade infusion Q 8 weeks: in clinical and endoscopic remission as of 12/2018 GI f/u.  06/11/19 rpt colonoscopy->cecal and ascending colon colitis.    Past Surgical History:  Procedure Laterality Date  . ABDOMINAL HYSTERECTOMY  1980   Paps no longer indicated.  Marland Kitchen BACTERIAL OVERGROWTH TEST N/A 07/13/2015   Procedure: BACTERIAL OVERGROWTH TEST;  Surgeon: Rogene Houston, MD;  [facility];  Service: Endoscopy;  Laterality: N/A;  730    . BILATERAL SALPINGOOPHORECTOMY  02/10/2001  . BIOPSY  06/11/2019   Procedure: BIOPSY;  Surgeon: Rogene Houston, MD;  [facility];  Service: Endoscopy;;  colon  . BIOPSY  09/27/2019   Procedure: BIOPSY;  Surgeon: Rogene Houston, MD;  [facility];  Service: Endoscopy;;  gastric duodenal  . BIOPSY  09/15/2020   Procedure: BIOPSY;  Surgeon:  Harvel Quale, MD;  [facility];  Service: Gastroenterology;;  . [place]  . CARDIOVASCULAR STRESS TEST  07/2010   Lexiscan myoview: normal  . CARPAL TUNNEL RELEASE Right 1996  . CARPAL TUNNEL RELEASE Left 03/21/2003  . CARPAL TUNNEL RELEASE Right 05/04/2013   Procedure: CARPAL TUNNEL RELEASE;  Surgeon: Cammie Sickle., MD;  Location: [place];  Service: Orthopedics;  Laterality: Right;  . CARPAL TUNNEL RELEASE Left 09/21/2013   Procedure: LEFT CARPAL TUNNEL RELEASE;  Surgeon: Cammie Sickle., MD;  Location: [place];  Service: Orthopedics;  Laterality: Left;  . CHOLECYSTECTOMY    . COLONOSCOPY WITH PROPOFOL N/A 08/04/2015   Colitis in remission.  No polyps.  Procedure: COLONOSCOPY WITH PROPOFOL;  Surgeon: Rogene Houston, MD;  [facility];  Service: Endoscopy;  Laterality: N/A;  cecum time in  0820   time out  0827    total time 7 minutes  . COLONOSCOPY WITH PROPOFOL N/A 06/11/2019   cecal and ascending colon colitis.  Procedure: COLONOSCOPY WITH PROPOFOL;  Surgeon: Rogene Houston, MD;  [facility];  Service: Endoscopy;  Laterality: N/A;  730a  . COLONOSCOPY WITH PROPOFOL N/A 09/15/2020   Procedure: COLONOSCOPY WITH PROPOFOL;  Surgeon: Harvel Quale, MD;  [facility];  Service: Gastroenterology;  Laterality: N/A;  1030  . ESOPHAGEAL DILATION N/A 08/04/2015   Procedure: ESOPHAGEAL DILATION;  Surgeon: Rogene Houston, MD;  [facility];  Service: Endoscopy;  Laterality: N/A;  [place] 57, no mucousal disruption  . ESOPHAGOGASTRODUODENOSCOPY  09/27/2019   Performed for IDA.  Esoph dilation was done but no stricture present.  Mild portal hypertensive gastropathy, o/w normal.  Duodenal bx NEG.  h pylori neg.  Marland Kitchen ESOPHAGOGASTRODUODENOSCOPY (EGD) WITH ESOPHAGEAL DILATION  12/02/2005  . ESOPHAGOGASTRODUODENOSCOPY (EGD) WITH PROPOFOL N/A 08/04/2015   Procedure:  ESOPHAGOGASTRODUODENOSCOPY (EGD) WITH PROPOFOL;  Surgeon: Rogene Houston, MD;  [facility];  Service: Endoscopy;  Laterality: N/A;  procedure 1  . ESOPHAGOGASTRODUODENOSCOPY (EGD) WITH PROPOFOL N/A 09/27/2019   Procedure: ESOPHAGOGASTRODUODENOSCOPY (EGD) WITH PROPOFOL;  Surgeon: Rogene Houston, MD;  [facility];  Service: Endoscopy;  Laterality: N/A;  12:10  . FLEXIBLE SIGMOIDOSCOPY  01/17/2012   Procedure: FLEXIBLE SIGMOIDOSCOPY;  Surgeon: Rogene Houston, MD;  [facility];  Service: Endoscopy;  Laterality: N/A;  . GIVENS CAPSULE STUDY N/A 08/03/2019   Procedure: GIVENS CAPSULE STUDY (performed for IDA)->some food debris in stomach and small amount of blood.  Surgeon: Rogene Houston, MD;  [facility];  Service: Endoscopy;  Laterality: N/A;  730AM  . HEMILAMINOTOMY LUMBAR SPINE Bilateral 09/07/1999   L4-5  . KNEE ARTHROSCOPY Right 01/1999; 10/2000  . LYSIS OF ADHESION  02/10/2001  . MALONEY DILATION  09/27/2019   Procedure: MALONEY DILATION;  Surgeon: Rogene Houston, MD;  [facility];  Service: Endoscopy;;  . [place]; 09/12/2006  . TARSAL TUNNEL RELEASE  2002  . TRANSTHORACIC ECHOCARDIOGRAM  08/04/2015   EF 60-65%, normal wall motion, mild LVH, mild LA dilation, grd I DD.  Marland Kitchen TUMOR EXCISION Left 03/21/2003   dorsal 1st web space (hand)  . URETEROLYSIS Right 02/10/2001    Outpatient Medications Prior to Visit  Medication Sig Dispense Refill  . ACCU-CHEK GUIDE test strip USE TO CHECK BLOOD GLUCOSE UP TO 6 TIMES DAILY AS DIRECTED 200 strip 1  . Accu-Chek Softclix Lancets lancets USE TO CHECK BLOOD GLUCOSE UP TO 6 TIMES DAILY AS DIRECTED 200 each 1  . B-D UF III MINI PEN NEEDLES 31G X 5 MM MISC USE TO INJECT INSULINS EQUAL TO 6 TIMES DAILY. 600 each 5  . blood glucose meter kit and supplies KIT Use up to six times daily as directed. DX. E11.9 1 each 0  . citalopram (CELEXA) 20 MG tablet TAKE 1 TABLET BY MOUTH EVERY DAY 90  tablet 3  . Cyanocobalamin (B-12) 5000 MCG CAPS Take 5,000 mcg by mouth daily.    Marland Kitchen esomeprazole (NEXIUM) 20 MG capsule Take 20 mg by mouth daily.     . ferrous sulfate 325 (65 FE) MG tablet Take 1 tablet (325 mg total) by mouth 2 (two) times daily with a meal. (Patient taking differently: Take 325 mg by mouth  daily.)    . furosemide (LASIX) 40 MG tablet TAKE 1 TABLET BY MOUTH EVERY DAY 90 tablet 3  . gabapentin (NEURONTIN) 600 MG tablet TAKE 1 AND 1/2 TABLETS BY MOUTH TWICE A DAY 270 tablet 3  . inFLIXimab (REMICADE) 100 MG injection Inject 100 mg into the vein every 8 (eight) weeks.     . insulin regular human CONCENTRATED (HUMULIN R U-500 KWIKPEN) 500 UNIT/ML kwikpen Inject 125-135 Units into the skin See admin instructions. Inject 125 units with breakfast, 135 units with lunch, and 125 units with supper. 15 mL 3  . KLOR-CON M10 10 MEQ tablet TAKE 1 TABLET BY MOUTH EVERY DAY 90 tablet 3  . lactase (LACTAID) 3000 UNITS tablet Take 3,000 Units by mouth as needed (when eating foods containing dairy).     . metoprolol succinate (TOPROL-XL) 100 MG 24 hr tablet TAKE 1 TABLET BY MOUTH 2 TIMES DAILY. TAKE WITH OR IMMEDIATELY FOLLOWING A MEAL 180 tablet 3  . Multiple Vitamin (MULTIVITAMIN WITH MINERALS) TABS tablet Take 1 tablet by mouth daily.    . Polyvinyl Alcohol-Povidone (REFRESH OP) Place 1 drop into both eyes daily as needed (dry eyes).    . TRULICITY 1.5 HF/0.2OV SOPN INJECT UNDER SKIN 1.5 MG WEEKLY (Patient taking differently: Inject 1.5 mg as directed every Thursday.) 12 pen 3  . XARELTO 20 MG TABS tablet TAKE 1 TABLET (20 MG TOTAL) BY MOUTH DAILY WITH SUPPER. 90 tablet 1  . acetaminophen (TYLENOL) 325 MG tablet Take 650 mg by mouth every 6 (six) hours as needed for moderate pain or headache. (Patient not taking: Reported on 10/26/2020)    . colestipol (COLESTID) 1 g tablet Take 2 tablets (2 g total) by mouth daily. 60 tablet 3  . meclizine (ANTIVERT) 25 MG tablet Take 1 tablet (25 mg total) by  mouth 3 (three) times daily as needed for dizziness or nausea. (Patient not taking: Reported on 10/26/2020) 90 tablet 0   No facility-administered medications prior to visit.    Allergies  Allergen Reactions  . Omeprazole Anaphylaxis and Swelling    SWELLING OF TONGUE AND THROAT  . Actos [Pioglitazone] Other (See Comments)    Weight gain  . Benzocaine-Menthol Swelling    SWELLING OF MOUTH  . Colesevelam Other (See Comments)    GI UPSET  . Flagyl [Metronidazole Hcl] Other (See Comments)    DIAPHORESIS  . Metformin And Related Diarrhea  . Shrimp [Shellfish Allergy] Itching    OF THROAT AND EARS  . Statins Palpitations  . Desipramine Hcl Itching, Nausea Only and Other (See Comments)    "swimmy" headed, ears itched   . Hydromorphone Itching  . Jardiance [Empagliflozin] Other (See Comments)    weakness  . Lactose Intolerance (Gi) Diarrhea    Gas, bloating  . Adhesive [Tape] Other (See Comments)    SKIN IRRITATION AND BRUISING  . Nisoldipine Itching  . Percocet [Oxycodone-Acetaminophen] Itching    ROS As per HPI  PE: Vitals with BMI 10/26/2020 10/09/2020 10/03/2020  Height _0  _1  _2   Weight 233 lbs 13 oz 230 lbs 13 oz 231 lbs 8 oz  BMI 42.75 78.5 88.50  Systolic 277 412 878  Diastolic 67 64 74  Pulse 72 74 76   Gen: Alert, well appearing.  Patient is oriented to person, place, time, and situation. AFFECT: pleasant, lucid thought and speech.  No further exam today.  LABS:  Lab Results  Component Value Date   TSH 1.09 08/18/2020   Lab Results  Component Value Date   WBC 4.6 10/11/2020   HGB 13.3 10/11/2020   HCT 38 10/11/2020   MCV 99.1 08/18/2020   PLT 90 (A) 10/11/2020   Lab Results  Component Value Date   IRON 111 08/18/2020   TIBC 268 08/18/2020   FERRITIN 86 08/18/2020    Lab Results  Component Value Date   CREATININE 0.8 10/11/2020   BUN 11 10/11/2020   NA 144 10/11/2020   K 4.3 10/11/2020   CL 106 10/11/2020   CO2 18 10/11/2020   Lab  Results  Component Value Date   ALT 44 (A) 10/11/2020   AST 48 (A) 10/11/2020   ALKPHOS 80 10/11/2020   BILITOT 0.9 08/18/2020   Lab Results  Component Value Date   CHOL 202 (H) 04/18/2020   Lab Results  Component Value Date   HDL 41.50 04/18/2020   Lab Results  Component Value Date   LDLCALC 139 (H) 04/18/2020   Lab Results  Component Value Date   TRIG 108.0 04/18/2020   Lab Results  Component Value Date   CHOLHDL 5 04/18/2020   Lab Results  Component Value Date   HGBA1C 7.0 (H) 08/18/2020   IMPRESSION AND PLAN:  1) DM, home glucoses erratic.   A1c has been 7 to 7.5% range over the last 18 mo. Erring on high side at this time, cont U500 insulin 125-135-125 and NO MORE prn hs dosing of this med. Cont trulicity q week. Next a1c due in about 1 mo. Discussed possible endo referral and we'll keep this in mind esp if next a1c is rising (? Insulin pump candidate?).  2) HTN: not ideal control but erring a little on high side since she has tendency to get some random/unexplained lows. No changes today (toprol xl 116m, 1 bid).  3) NASH Cirrhosis: transaminases have been stable. Platelets stable. Per GI rec's we started her Hep B vaccine series today (engerix), get #2 at f/u in 1 mo, then #3 six months from now.  4) Morbid obesity: was in process of getting bariatric surg (nutritionist visits, + was going to get psych eval and then possibly get scheduled for surg --as per my 08/18/19 f/u note). I think she may need to get back in touch with surgeon's office to resume this process.  5) Hist of IDA: GI endo eval incl givens all neg for site of bleeding. Suspected due to iron malabs from chronic PPI +portal hypertensive gastroduodenopathy, plus some contribution from recurrent epistaxis.  Epistaxis no longer an issue since having nasal cautery.  She is not on any iron supplement. Most recent Hb 13.3 on 10/11/20.  An After Visit Summary was printed and given to the  patient.  FOLLOW UP: No follow-ups on file.  Signed:  PCrissie Sickles MD           10/26/2020

## 2020-10-26 NOTE — Patient Instructions (Signed)
No bedtime insulin.

## 2020-10-31 ENCOUNTER — Encounter (INDEPENDENT_AMBULATORY_CARE_PROVIDER_SITE_OTHER): Payer: Self-pay

## 2020-11-03 ENCOUNTER — Encounter (INDEPENDENT_AMBULATORY_CARE_PROVIDER_SITE_OTHER): Payer: Self-pay

## 2020-11-07 ENCOUNTER — Other Ambulatory Visit (INDEPENDENT_AMBULATORY_CARE_PROVIDER_SITE_OTHER): Payer: Self-pay | Admitting: Gastroenterology

## 2020-11-07 ENCOUNTER — Other Ambulatory Visit: Payer: Self-pay | Admitting: Family Medicine

## 2020-11-07 DIAGNOSIS — R152 Fecal urgency: Secondary | ICD-10-CM

## 2020-11-08 ENCOUNTER — Other Ambulatory Visit (INDEPENDENT_AMBULATORY_CARE_PROVIDER_SITE_OTHER): Payer: Self-pay | Admitting: Internal Medicine

## 2020-11-08 DIAGNOSIS — R152 Fecal urgency: Secondary | ICD-10-CM

## 2020-11-08 MED ORDER — COLESTIPOL HCL 1 G PO TABS: 2.0000 g | ORAL_TABLET | Freq: Every day | ORAL | 3 refills | Status: DC

## 2020-11-08 NOTE — Telephone Encounter (Signed)
Prescription for colestipol 2 g daily 90-day supply with 3 refills sent to patient's pharmacy

## 2020-11-08 NOTE — Telephone Encounter (Signed)
Note on Dr. Rehman's desk 

## 2020-11-20 ENCOUNTER — Other Ambulatory Visit: Payer: Self-pay | Admitting: Family Medicine

## 2020-11-22 ENCOUNTER — Other Ambulatory Visit: Payer: Self-pay

## 2020-11-23 ENCOUNTER — Ambulatory Visit: Payer: Medicare PPO | Admitting: Family Medicine

## 2020-11-23 ENCOUNTER — Encounter: Payer: Self-pay | Admitting: Family Medicine

## 2020-11-23 VITALS — BP 140/72 | HR 70 | Temp 98.0°F | Resp 16 | Ht 62.0 in | Wt 237.2 lb

## 2020-11-23 DIAGNOSIS — E118 Type 2 diabetes mellitus with unspecified complications: Secondary | ICD-10-CM

## 2020-11-23 DIAGNOSIS — I10 Essential (primary) hypertension: Secondary | ICD-10-CM | POA: Diagnosis not present

## 2020-11-23 LAB — POCT GLYCOSYLATED HEMOGLOBIN (HGB A1C)
HbA1c POC (<> result, manual entry): 7 % (ref 4.0–5.6)
HbA1c, POC (controlled diabetic range): 7 % (ref 0.0–7.0)
HbA1c, POC (prediabetic range): 7 % — AB (ref 5.7–6.4)
Hemoglobin A1C: 7 % — AB (ref 4.0–5.6)

## 2020-11-23 NOTE — Progress Notes (Signed)
OFFICE VISIT  11/23/2020  CC: f/u chronic illnesses  HPI:    Patient is a 75 y.o. Caucasian female who presents for 1 mo f/u dm. A/P as of last visit: "1) DM, home glucoses erratic.   A1c has been 7 to 7.5% range over the last 18 mo. Erring on high side at this time, cont U500 insulin 125-135-125 and NO MORE prn hs dosing of this med. Cont trulicity q week. Next a1c due in about 1 mo. Discussed possible endo referral and we'll keep this in mind esp if next a1c is rising (? Insulin pump candidate?).  2) HTN: not ideal control but erring a little on high side since she has tendency to get some random/unexplained lows. No changes today (toprol xl 159m, 1 bid).  3) NASH Cirrhosis: transaminases have been stable. Platelets stable. Per GI rec's we started her Hep B vaccine series today (engerix), get #2 at f/u in 1 mo, then #3 six months from now.  4) Morbid obesity: was in process of getting bariatric surg (nutritionist visits, + was going to get psych eval and then possibly get scheduled for surg --as per my 08/18/19 f/u note). I think she may need to get back in touch with surgeon's office to resume this process.  5) Hist of IDA: GI endo eval incl givens all neg for site of bleeding. Suspected due to iron malabs from chronic PPI +portal hypertensive gastroduodenopathy, plus some contribution from recurrent epistaxis.  Epistaxis no longer an issue since having nasal cautery.  She is not on any iron supplement. Most recent Hb 13.3 on 10/11/20."  INTERIM HX: Feeling fine Home bp's consistently 130/80 or better.  Gluc pattern same as ever---all over the place, some morning lows but they are random.  Taking U500 125 qAM, 135 qLunch, 125 qSupper, not taking ANY bedtime insulin.       Past Medical History:  Diagnosis Date  . 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 Cirrhosis, nonalcoholic ([place] 140/8144  NASH--> early cirrhotic changes on ultrasound 07/2018.  ? to get liver bx if she gets bariatric surgery? Mild portal hypertensive gastropathy on EGD 08/2019.  .Marland KitchenFibromyalgia   . GAD (generalized anxiety disorder)   . GERD   . History of iron deficiency anemia 12/2018   Possibly inadequate absorption secondary to chronic/long term PPI therapy + portal hypertensive gastroduodenopathy. No GIB found on EGD, colonosc, and givens. Oral iron restarted 12/2019--unclear compliance.  .Marland KitchenHistory of thrombocytopenia 12/2011  . Hyperlipidemia    Intolerant of statins  . HYPERTENSION   . IBS (irritable bowel syndrome)    -D.  Good response to bentyl and imodium as of 06/2018 GI f/u.  .Marland KitchenIDDM (insulin dependent diabetes mellitus)    with DPN (managed by Dr. GCruzita Ledererbut then in 2018 pt preferred to have me manage for her convenience)  . Limited mobility    Requires a walker for arthritic pain, widespread musculoskeletal pain, and neuropathic pain.  . Morbid obesity ([place]    As of 11/2018, pt considering sleeve gastectomy vs bipass as of eval by Dr. NDe Burrsconsidering as of 12/2019.  .Marland KitchenNonalcoholic steatohepatitis    Viral Hep screens NEG.  CT 2015.  Transaminasemia.  U/S 07/2018 showed early changes of cirrhosis.  . OSA (obstructive sleep apnea) 09/14/2015   sleep study 09/07/15: severe obstructive sleep apnea with an AHI of 72 and SaO2 low of 75%.>refer to sleep med for eval and tx options  .  Osteoarthritis    hips, shoulders, knees  . PAF (paroxysmal atrial fibrillation) ([place])    One documented episode (after getting EGD 2016).  Was on amiodarone x 3 mo.  Rate control with metoprolol + anticoag with xarelto.   . Recurrent epistaxis    Granuloma in L nare cauterized by ENT 04/2020. Another cautery 06/2020  . Small fiber neuropathy    Due to DM.  Symmetric hands and feet tingling/numbness.  Marland Kitchen Ulcerative colitis ([place])    Remicade infusion Q 8 weeks: in clinical and endoscopic remission as of 12/2018 GI f/u.  06/11/19 rpt colonoscopy->cecal and ascending colon  colitis.    Past Surgical History:  Procedure Laterality Date  . ABDOMINAL HYSTERECTOMY  1980   Paps no longer indicated.  Marland Kitchen BACTERIAL OVERGROWTH TEST N/A 07/13/2015   Procedure: BACTERIAL OVERGROWTH TEST;  Surgeon: Rogene Houston, MD;  [facility];  Service: Endoscopy;  Laterality: N/A;  730    . BILATERAL SALPINGOOPHORECTOMY  02/10/2001  . BIOPSY  06/11/2019   Procedure: BIOPSY;  Surgeon: Rogene Houston, MD;  [facility];  Service: Endoscopy;;  colon  . BIOPSY  09/27/2019   Procedure: BIOPSY;  Surgeon: Rogene Houston, MD;  [facility];  Service: Endoscopy;;  gastric duodenal  . BIOPSY  09/15/2020   Procedure: BIOPSY;  Surgeon: Harvel Quale, MD;  [facility];  Service: Gastroenterology;;  . [place]  . CARDIOVASCULAR STRESS TEST  07/2010   Lexiscan myoview: normal  . CARPAL TUNNEL RELEASE Right 1996  . CARPAL TUNNEL RELEASE Left 03/21/2003  . CARPAL TUNNEL RELEASE Right 05/04/2013   Procedure: CARPAL TUNNEL RELEASE;  Surgeon: Cammie Sickle., MD;  Location: [place];  Service: Orthopedics;  Laterality: Right;  . CARPAL TUNNEL RELEASE Left 09/21/2013   Procedure: LEFT CARPAL TUNNEL RELEASE;  Surgeon: Cammie Sickle., MD;  Location: [place];  Service: Orthopedics;  Laterality: Left;  . CHOLECYSTECTOMY    . COLONOSCOPY WITH PROPOFOL N/A 08/04/2015   Colitis in remission.  No polyps.  Procedure: COLONOSCOPY WITH PROPOFOL;  Surgeon: Rogene Houston, MD;  [facility];  Service: Endoscopy;  Laterality: N/A;  cecum time in  0820   time out  0827    total time 7 minutes  . COLONOSCOPY WITH PROPOFOL N/A 06/11/2019   cecal and ascending colon colitis.  Procedure: COLONOSCOPY WITH PROPOFOL;  Surgeon: Rogene Houston, MD;  [facility];  Service: Endoscopy;  Laterality: N/A;  730a  . COLONOSCOPY WITH PROPOFOL N/A 09/15/2020   Procedure:  COLONOSCOPY WITH PROPOFOL;  Surgeon: Harvel Quale, MD;  [facility];  Service: Gastroenterology;  Laterality: N/A;  1030  . ESOPHAGEAL DILATION N/A 08/04/2015   Procedure: ESOPHAGEAL DILATION;  Surgeon: Rogene Houston, MD;  [facility];  Service: Endoscopy;  Laterality: N/A;  [place] 56, no mucousal disruption  . ESOPHAGOGASTRODUODENOSCOPY  09/27/2019   Performed for IDA.  Esoph dilation was done but no stricture present.  Mild portal hypertensive gastropathy, o/w normal.  Duodenal bx NEG.  h pylori neg.  Marland Kitchen ESOPHAGOGASTRODUODENOSCOPY (EGD) WITH ESOPHAGEAL DILATION  12/02/2005  . ESOPHAGOGASTRODUODENOSCOPY (EGD) WITH PROPOFOL N/A 08/04/2015   Procedure: ESOPHAGOGASTRODUODENOSCOPY (EGD) WITH PROPOFOL;  Surgeon: Rogene Houston, MD;  [facility];  Service: Endoscopy;  Laterality: N/A;  procedure 1  . ESOPHAGOGASTRODUODENOSCOPY (EGD) WITH PROPOFOL N/A 09/27/2019   Procedure: ESOPHAGOGASTRODUODENOSCOPY (EGD) WITH  PROPOFOL;  Surgeon: Rogene Houston, MD;  [facility];  Service: Endoscopy;  Laterality: N/A;  12:10  . FLEXIBLE SIGMOIDOSCOPY  01/17/2012   Procedure: FLEXIBLE SIGMOIDOSCOPY;  Surgeon: Rogene Houston, MD;  [facility];  Service: Endoscopy;  Laterality: N/A;  . GIVENS CAPSULE STUDY N/A 08/03/2019   Procedure: GIVENS CAPSULE STUDY (performed for IDA)->some food debris in stomach and small amount of blood.  Surgeon: Rogene Houston, MD;  [facility];  Service: Endoscopy;  Laterality: N/A;  730AM  . HEMILAMINOTOMY LUMBAR SPINE Bilateral 09/07/1999   L4-5  . KNEE ARTHROSCOPY Right 01/1999; 10/2000  . LYSIS OF ADHESION  02/10/2001  . MALONEY DILATION  09/27/2019   Procedure: MALONEY DILATION;  Surgeon: Rogene Houston, MD;  [facility];  Service: Endoscopy;;  . [place]; 09/12/2006  . TARSAL TUNNEL RELEASE  2002  . TRANSTHORACIC ECHOCARDIOGRAM  08/04/2015   EF 60-65%, normal wall motion, mild LVH, mild  LA dilation, grd I DD.  Marland Kitchen TUMOR EXCISION Left 03/21/2003   dorsal 1st web space (hand)  . URETEROLYSIS Right 02/10/2001    Outpatient Medications Prior to Visit  Medication Sig Dispense Refill  . ACCU-CHEK GUIDE test strip USE TO CHECK BLOOD GLUCOSE UP TO 6 TIMES DAILY AS DIRECTED 200 strip 1  . Accu-Chek Softclix Lancets lancets USE TO CHECK BLOOD GLUCOSE UP TO 6 TIMES DAILY AS DIRECTED 200 each 1  . B-D UF III MINI PEN NEEDLES 31G X 5 MM MISC USE TO INJECT INSULINS EQUAL TO 6 TIMES DAILY. 600 each 5  . blood glucose meter kit and supplies KIT Use up to six times daily as directed. DX. E11.9 1 each 0  . citalopram (CELEXA) 20 MG tablet TAKE 1 TABLET BY MOUTH EVERY DAY 90 tablet 3  . colestipol (COLESTID) 1 g tablet Take 2 tablets (2 g total) by mouth daily. 180 tablet 3  . Cyanocobalamin (B-12) 5000 MCG CAPS Take 5,000 mcg by mouth daily.    Marland Kitchen esomeprazole (NEXIUM) 20 MG capsule Take 20 mg by mouth daily.     . ferrous sulfate 325 (65 FE) MG tablet Take 1 tablet (325 mg total) by mouth 2 (two) times daily with a meal. (Patient taking differently: Take 325 mg by mouth daily.)    . furosemide (LASIX) 40 MG tablet TAKE 1 TABLET BY MOUTH EVERY DAY 90 tablet 3  . gabapentin (NEURONTIN) 600 MG tablet TAKE 1 AND 1/2 TABLETS BY MOUTH TWICE A DAY 270 tablet 3  . inFLIXimab (REMICADE) 100 MG injection Inject 100 mg into the vein every 8 (eight) weeks.     . insulin regular human CONCENTRATED (HUMULIN R U-500 KWIKPEN) 500 UNIT/ML kwikpen Inject 125-135 Units into the skin See admin instructions. Inject 125 units with breakfast, 135 units with lunch, and 125 units with supper. 15 mL 3  . KLOR-CON M10 10 MEQ tablet TAKE 1 TABLET BY MOUTH EVERY DAY 90 tablet 3  . lactase (LACTAID) 3000 UNITS tablet Take 3,000 Units by mouth as needed (when eating foods containing dairy).     . meclizine (ANTIVERT) 25 MG tablet Take 1 tablet (25 mg total) by mouth 3 (three) times daily as needed for dizziness or nausea. 90  tablet 0  . metoprolol succinate (TOPROL-XL) 100 MG 24 hr tablet TAKE 1 TABLET BY MOUTH 2 TIMES DAILY. TAKE WITH OR IMMEDIATELY FOLLOWING A MEAL 180 tablet 3  . Multiple Vitamin (MULTIVITAMIN WITH MINERALS) TABS tablet  Take 1 tablet by mouth daily.    . Polyvinyl Alcohol-Povidone (REFRESH OP) Place 1 drop into both eyes daily as needed (dry eyes).    . TRULICITY 1.5 EV/0.3JK SOPN INJECT 1.5MG ONCE WEEKLY 6 mL 3  . XARELTO 20 MG TABS tablet TAKE 1 TABLET (20 MG TOTAL) BY MOUTH DAILY WITH SUPPER. 90 tablet 1  . acetaminophen (TYLENOL) 325 MG tablet Take 650 mg by mouth every 6 (six) hours as needed for moderate pain or headache. (Patient not taking: No sig reported)     No facility-administered medications prior to visit.    Allergies  Allergen Reactions  . Omeprazole Anaphylaxis and Swelling    SWELLING OF TONGUE AND THROAT  . Actos [Pioglitazone] Other (See Comments)    Weight gain  . Benzocaine-Menthol Swelling    SWELLING OF MOUTH  . Colesevelam Other (See Comments)    GI UPSET  . Flagyl [Metronidazole Hcl] Other (See Comments)    DIAPHORESIS  . Metformin And Related Diarrhea  . Shrimp [Shellfish Allergy] Itching    OF THROAT AND EARS  . Statins Palpitations  . Desipramine Hcl Itching, Nausea Only and Other (See Comments)    "swimmy" headed, ears itched   . Hydromorphone Itching  . Jardiance [Empagliflozin] Other (See Comments)    weakness  . Lactose Intolerance (Gi) Diarrhea    Gas, bloating  . Adhesive [Tape] Other (See Comments)    SKIN IRRITATION AND BRUISING  . Nisoldipine Itching  . Percocet [Oxycodone-Acetaminophen] Itching    ROS As per HPI  PE: Vitals with BMI 11/23/2020 10/26/2020 10/09/2020  Height 5' 2"  5' 2"  5' 2"   Weight 237 lbs 3 oz 233 lbs 13 oz 230 lbs 13 oz  BMI 43.37 09.38 18.2  Systolic 993 716 967  Diastolic 72 67 64  Pulse 70 72 74     Gen: Alert, well appearing.  Patient is oriented to person, place, time, and situation. AFFECT: pleasant,  lucid thought and speech. No further exam today.  LABS:  Lab Results  Component Value Date   TSH 1.09 08/18/2020   Lab Results  Component Value Date   WBC 4.6 10/11/2020   HGB 13.3 10/11/2020   HCT 38 10/11/2020   MCV 99.1 08/18/2020   PLT 90 (A) 10/11/2020   Lab Results  Component Value Date   CREATININE 0.8 10/11/2020   BUN 11 10/11/2020   NA 144 10/11/2020   K 4.3 10/11/2020   CL 106 10/11/2020   CO2 18 10/11/2020   Lab Results  Component Value Date   ALT 44 (A) 10/11/2020   AST 48 (A) 10/11/2020   ALKPHOS 80 10/11/2020   BILITOT 0.9 08/18/2020   Lab Results  Component Value Date   CHOL 202 (H) 04/18/2020   Lab Results  Component Value Date   HDL 41.50 04/18/2020   Lab Results  Component Value Date   LDLCALC 139 (H) 04/18/2020   Lab Results  Component Value Date   TRIG 108.0 04/18/2020   Lab Results  Component Value Date   CHOLHDL 5 04/18/2020   Lab Results  Component Value Date   HGBA1C 7.0 (H) 08/18/2020   POC a1c today is 7%  IMPRESSION AND PLAN:  1) sDM, home glucoses erratic.   A1c has been 7 to 7.5% range over the last 18 mo. A1c 7.0% today. Cont U500 insulin 125 qAM, 135 qLunch, and dec supper dose to 120 to try to dec chances of the early morning hypoglycemia she has tended  to have.  No hs insulin should be given. Cont trulicity q week. Discussed possible endo referral and we'll keep this in mind going forward but hold off for now.  2) HTN: not ideal control but erring a little on high side since she has tendency to get some random/unexplained lows. No changes today (toprol xl 196m, 1 bid).  3) NASH Cirrhosis: transaminases have been stable. Platelets stable. Hep B vaccine #1 given 10/20/20. Need to continue with this vaccine series.  4) Morbid obesity: was in process of getting bariatric surg (nutritionist visits, + was going to get psych eval and then possibly get scheduled for surg --as per my 08/18/19 f/u note). Her insurer  changed and per pt's conversations with them it does not look promising that they will be covering bariatric surgery.  She'll hold off from getting back in with bariatric surg at this time.  5) Hist of IDA: GI endo eval incl givens study all neg for site of bleeding. Suspected due to iron malabs from chronic PPI +portal hypertensive gastroduodenopathy, plus some contribution from recurrent epistaxis.  Epistaxis no longer an issue since having nasal cautery.  Hb stable last visit at 13.3.    An After Visit Summary was printed and given to the patient.  FOLLOW UP: No follow-ups on file.  Signed:  PCrissie Sickles MD           11/23/2020

## 2020-11-23 NOTE — Patient Instructions (Signed)
NO CHANGES EXCEPT decrease your supper time insulin dose to 120 units.

## 2020-11-27 ENCOUNTER — Telehealth (INDEPENDENT_AMBULATORY_CARE_PROVIDER_SITE_OTHER): Payer: Self-pay | Admitting: *Deleted

## 2020-11-27 NOTE — Telephone Encounter (Signed)
Patient called and left a message asking that I call her . She had a question about medication. Patient was called and I left a message on her voice mail to call office back.

## 2020-11-29 ENCOUNTER — Ambulatory Visit (INDEPENDENT_AMBULATORY_CARE_PROVIDER_SITE_OTHER): Payer: Medicare PPO

## 2020-11-29 VITALS — Ht 62.0 in | Wt 237.0 lb

## 2020-11-29 DIAGNOSIS — Z0000 Encounter for general adult medical examination without abnormal findings: Secondary | ICD-10-CM | POA: Diagnosis not present

## 2020-11-29 NOTE — Patient Instructions (Signed)
[name] , Thank you for taking time to complete your Medicare Wellness Visit. I appreciate your ongoing commitment to your health goals. Please review the following plan we discussed and let me know if I can assist you in the future.   Screening recommendations/referrals: Colonoscopy: Completed 09/15/2020-Due 09/15/2025 Mammogram: Due-Per our conversation, GYN to order. Bone Density: Completed 04/29/2019-Due 04/29/2019 Recommended yearly ophthalmology/optometry visit for glaucoma screening and checkup Recommended yearly dental visit for hygiene and checkup  Vaccinations: Influenza vaccine: Up to date Pneumococcal vaccine: Completed vaccines Tdap vaccine: Up to date-Due 04/15/2029 Shingles vaccine: Discuss with pharmacy  Covid-19:Booster due  Advanced directives: Copy in chart  Conditions/risks identified: See problem list  Next appointment: Follow up in one year for your annual wellness visit    Preventive Care 39 Years and Older, Female Preventive care refers to lifestyle choices and visits with your health care provider that can promote health and wellness. What does preventive care include?  A yearly physical exam. This is also called an annual well check.  Dental exams once or twice a year.  Routine eye exams. Ask your health care provider how often you should have your eyes checked.  Personal lifestyle choices, including:  Daily care of your teeth and gums.  Regular physical activity.  Eating a healthy diet.  Avoiding tobacco and drug use.  Limiting alcohol use.  Practicing safe sex.  Taking low-dose aspirin every day.  Taking vitamin and mineral supplements as recommended by your health care provider. What happens during an annual well check? The services and screenings done by your health care provider during your annual well check will depend on your age, overall health, lifestyle risk factors, and family history of disease. Counseling  Your health care  provider may ask you questions about your:  Alcohol use.  Tobacco use.  Drug use.  Emotional well-being.  Home and relationship well-being.  Sexual activity.  Eating habits.  History of falls.  Memory and ability to understand (cognition).  Work and work Statistician.  Reproductive health. Screening  You may have the following tests or measurements:  Height, weight, and BMI.  Blood pressure.  Lipid and cholesterol levels. These may be checked every 5 years, or more frequently if you are over 32 years old.  Skin check.  Lung cancer screening. You may have this screening every year starting at age 67 if you have a 30-pack-year history of smoking and currently smoke or have quit within the past 15 years.  Fecal occult blood test (FOBT) of the stool. You may have this test every year starting at age 5.  Flexible sigmoidoscopy or colonoscopy. You may have a sigmoidoscopy every 5 years or a colonoscopy every 10 years starting at age 87.  Hepatitis C blood test.  Hepatitis B blood test.  Sexually transmitted disease (STD) testing.  Diabetes screening. This is done by checking your blood sugar (glucose) after you have not eaten for a while (fasting). You may have this done every 1-3 years.  Bone density scan. This is done to screen for osteoporosis. You may have this done starting at age 88.  Mammogram. This may be done every 1-2 years. Talk to your health care provider about how often you should have regular mammograms. Talk with your health care provider about your test results, treatment options, and if necessary, the need for more tests. Vaccines  Your health care provider may recommend certain vaccines, such as:  Influenza vaccine. This is recommended every year.  Tetanus, diphtheria, and  acellular pertussis (Tdap, Td) vaccine. You may need a Td booster every 10 years.  Zoster vaccine. You may need this after age 71.  Pneumococcal 13-valent conjugate (PCV13)  vaccine. One dose is recommended after age 60.  Pneumococcal polysaccharide (PPSV23) vaccine. One dose is recommended after age 45. Talk to your health care provider about which screenings and vaccines you need and how often you need them. This information is not intended to replace advice given to you by your health care provider. Make sure you discuss any questions you have with your health care provider. Document Released: 10/13/2015 Document Revised: 06/05/2016 Document Reviewed: 07/18/2015 Elsevier Interactive Patient Education  2017 [place] Prevention in the Home Falls can cause injuries. They can happen to people of all ages. There are many things you can do to make your home safe and to help prevent falls. What can I do on the outside of my home?  Regularly fix the edges of walkways and driveways and fix any cracks.  Remove anything that might make you trip as you walk through a door, such as a raised step or threshold.  Trim any bushes or trees on the path to your home.  Use bright outdoor lighting.  Clear any walking paths of anything that might make someone trip, such as rocks or tools.  Regularly check to see if handrails are loose or broken. Make sure that both sides of any steps have handrails.  Any raised decks and porches should have guardrails on the edges.  Have any leaves, snow, or ice cleared regularly.  Use sand or salt on walking paths during winter.  Clean up any spills in your garage right away. This includes oil or grease spills. What can I do in the bathroom?  Use night lights.  Install grab bars by the toilet and in the tub and shower. Do not use towel bars as grab bars.  Use non-skid mats or decals in the tub or shower.  If you need to sit down in the shower, use a plastic, non-slip stool.  Keep the floor dry. Clean up any water that spills on the floor as soon as it happens.  Remove soap buildup in the tub or shower  regularly.  Attach bath mats securely with double-sided non-slip rug tape.  Do not have throw rugs and other things on the floor that can make you trip. What can I do in the bedroom?  Use night lights.  Make sure that you have a light by your bed that is easy to reach.  Do not use any sheets or blankets that are too big for your bed. They should not hang down onto the floor.  Have a firm chair that has side arms. You can use this for support while you get dressed.  Do not have throw rugs and other things on the floor that can make you trip. What can I do in the kitchen?  Clean up any spills right away.  Avoid walking on wet floors.  Keep items that you use a lot in easy-to-reach places.  If you need to reach something above you, use a strong step stool that has a grab bar.  Keep electrical cords out of the way.  Do not use floor polish or wax that makes floors slippery. If you must use wax, use non-skid floor wax.  Do not have throw rugs and other things on the floor that can make you trip. What can I do with my stairs?  Do not leave any items on the stairs.  Make sure that there are handrails on both sides of the stairs and use them. Fix handrails that are broken or loose. Make sure that handrails are as long as the stairways.  Check any carpeting to make sure that it is firmly attached to the stairs. Fix any carpet that is loose or worn.  Avoid having throw rugs at the top or bottom of the stairs. If you do have throw rugs, attach them to the floor with carpet tape.  Make sure that you have a light switch at the top of the stairs and the bottom of the stairs. If you do not have them, ask someone to add them for you. What else can I do to help prevent falls?  Wear shoes that:  Do not have high heels.  Have rubber bottoms.  Are comfortable and fit you well.  Are closed at the toe. Do not wear sandals.  If you use a stepladder:  Make sure that it is fully  opened. Do not climb a closed stepladder.  Make sure that both sides of the stepladder are locked into place.  Ask someone to hold it for you, if possible.  Clearly mark and make sure that you can see:  Any grab bars or handrails.  First and last steps.  Where the edge of each step is.  Use tools that help you move around (mobility aids) if they are needed. These include:  Canes.  Walkers.  Scooters.  Crutches.  Turn on the lights when you go into a dark area. Replace any light bulbs as soon as they burn out.  Set up your furniture so you have a clear path. Avoid moving your furniture around.  If any of your floors are uneven, fix them.  If there are any pets around you, be aware of where they are.  Review your medicines with your doctor. Some medicines can make you feel dizzy. This can increase your chance of falling. Ask your doctor what other things that you can do to help prevent falls. This information is not intended to replace advice given to you by your health care provider. Make sure you discuss any questions you have with your health care provider. Document Released: 07/13/2009 Document Revised: 02/22/2016 Document Reviewed: 10/21/2014 Elsevier Interactive Patient Education  2017 [employer].

## 2020-11-29 NOTE — Progress Notes (Signed)
Subjective:   [name] is a 75 y.o. female who presents for Medicare Annual (Subsequent) preventive examination.  I connected with [name] today by telephone and verified that I am speaking with the correct person using two identifiers. Location patient: home Location provider: work Persons participating in the virtual visit: patient, Marine scientist.    I discussed the limitations, risks, security and privacy concerns of performing an evaluation and management service by telephone and the availability of in person appointments. I also discussed with the patient that there may be a patient responsible charge related to this service. The patient expressed understanding and verbally consented to this telephonic visit.    Interactive audio and video telecommunications were attempted between this provider and patient, however failed, due to patient having technical difficulties OR patient did not have access to video capability.  We continued and completed visit with audio only.  Some vital signs may be absent or patient reported.   Time Spent with patient on telephone encounter: 30 minutes   Review of Systems     Cardiac Risk Factors include: advanced age (>39mn, >>54women);diabetes mellitus;dyslipidemia;hypertension;obesity (BMI >30kg/m2);sedentary lifestyle     Objective:    Today's Vitals   11/29/20 1116  Weight: 237 lb (107.5 kg)  Height: 5' 2"  (1.575 m)   Body mass index is 43.35 kg/m.  Advanced Directives 11/29/2020 09/15/2020 09/13/2020 09/27/2019 09/22/2019 06/11/2019 09/26/2017  Does Patient Have a Medical Advance Directive? Yes Yes Yes No No Yes Yes  Type of [place]amed[place]of [place] will [place] will [place] will - - [place] will Living will;Healthcare Power of Attorney  Does patient want to make changes to medical advance directive? - - - - - - -  Copy of Healthcare  Power of Attorney in Chart? - No - copy requested No - copy requested - - No - copy requested No - copy requested  Would patient like information on creating a medical advance directive? - - No - Patient declined No - Patient declined No - Patient declined - -  Pre-existing out of facility DNR order (yellow form or pink MOST form) - - - - - - -    Current Medications (verified) Outpatient Encounter Medications as of 11/29/2020  Medication Sig  . ACCU-CHEK GUIDE test strip USE TO CHECK BLOOD GLUCOSE UP TO 6 TIMES DAILY AS DIRECTED  . Accu-Chek Softclix Lancets lancets USE TO CHECK BLOOD GLUCOSE UP TO 6 TIMES DAILY AS DIRECTED  . acetaminophen (TYLENOL) 325 MG tablet Take 650 mg by mouth every 6 (six) hours as needed for moderate pain or headache.  . B-D UF III MINI PEN NEEDLES 31G X 5 MM MISC USE TO INJECT INSULINS EQUAL TO 6 TIMES DAILY.  . blood glucose meter kit and supplies KIT Use up to six times daily as directed. DX. E11.9  . citalopram (CELEXA) 20 MG tablet TAKE 1 TABLET BY MOUTH EVERY DAY  . colestipol (COLESTID) 1 g tablet Take 2 tablets (2 g total) by mouth daily.  . Cyanocobalamin (B-12) 5000 MCG CAPS Take 5,000 mcg by mouth daily.  .Marland Kitchenesomeprazole (NEXIUM) 20 MG capsule Take 20 mg by mouth daily.   . ferrous sulfate 325 (65 FE) MG tablet Take 1 tablet (325 mg total) by mouth 2 (two) times daily with a meal. (Patient taking differently: Take 325 mg by mouth daily.)  . furosemide (LASIX) 40 MG tablet TAKE 1 TABLET BY MOUTH EVERY DAY  .  gabapentin (NEURONTIN) 600 MG tablet TAKE 1 AND 1/2 TABLETS BY MOUTH TWICE A DAY  . inFLIXimab (REMICADE) 100 MG injection Inject 100 mg into the vein every 8 (eight) weeks.   . insulin regular human CONCENTRATED (HUMULIN R U-500 KWIKPEN) 500 UNIT/ML kwikpen Inject 125-135 Units into the skin See admin instructions. Inject 125 units with breakfast, 135 units with lunch, and 125 units with supper.  Marland Kitchen KLOR-CON M10 10 MEQ tablet TAKE 1 TABLET BY MOUTH EVERY  DAY  . lactase (LACTAID) 3000 UNITS tablet Take 3,000 Units by mouth as needed (when eating foods containing dairy).   . meclizine (ANTIVERT) 25 MG tablet Take 1 tablet (25 mg total) by mouth 3 (three) times daily as needed for dizziness or nausea.  . metoprolol succinate (TOPROL-XL) 100 MG 24 hr tablet TAKE 1 TABLET BY MOUTH 2 TIMES DAILY. TAKE WITH OR IMMEDIATELY FOLLOWING A MEAL  . Multiple Vitamin (MULTIVITAMIN WITH MINERALS) TABS tablet Take 1 tablet by mouth daily.  . Polyvinyl Alcohol-Povidone (REFRESH OP) Place 1 drop into both eyes daily as needed (dry eyes).  . TRULICITY 1.5 TM/2.2QJ SOPN INJECT 1.5MG ONCE WEEKLY  . XARELTO 20 MG TABS tablet TAKE 1 TABLET (20 MG TOTAL) BY MOUTH DAILY WITH SUPPER.  . [DISCONTINUED] amitriptyline (ELAVIL) 25 MG tablet Take 1 tablet (25 mg total) by mouth at bedtime. start with 1/2 tab at night for 1 week then increase to 1 whole tab.  . [DISCONTINUED] bromocriptine (PARLODEL) 2.5 MG tablet Take 2.5 mg by mouth 2 (two) times daily.    . [DISCONTINUED] potassium chloride (K-DUR) 10 MEQ tablet Take 2 tablets (20 mEq total) by mouth daily.   No facility-administered encounter medications on file as of 11/29/2020.    Allergies (verified) Omeprazole, Actos [pioglitazone], Benzocaine-menthol, Colesevelam, Flagyl [metronidazole hcl], Metformin and related, Shrimp [shellfish allergy], Statins, Desipramine hcl, Hydromorphone, Jardiance [empagliflozin], Lactose intolerance (gi), Adhesive [tape], Nisoldipine, and Percocet [oxycodone-acetaminophen]   History: Past Medical History:  Diagnosis Date  . 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 Cirrhosis, nonalcoholic ([place]) 33/5456   NASH--> early cirrhotic changes on ultrasound 07/2018. ? to get liver bx if she gets bariatric surgery? Mild portal hypertensive gastropathy on EGD 08/2019.  Marland Kitchen Fibromyalgia   . GAD (generalized anxiety disorder)   . GERD   . History of iron deficiency anemia 12/2018    Possibly inadequate absorption secondary to chronic/long term PPI therapy + portal hypertensive gastroduodenopathy. No GIB found on EGD, colonosc, and givens. Oral iron restarted 12/2019--unclear compliance.  Marland Kitchen History of thrombocytopenia 12/2011  . Hyperlipidemia    Intolerant of statins  . HYPERTENSION   . IBS (irritable bowel syndrome)    -D.  Good response to bentyl and imodium as of 06/2018 GI f/u.  Marland Kitchen IDDM (insulin dependent diabetes mellitus)    with DPN (managed by Dr. Cruzita Lederer but then in 2018 pt preferred to have me manage for her convenience)  . Limited mobility    Requires a walker for arthritic pain, widespread musculoskeletal pain, and neuropathic pain.  . Morbid obesity ([place])    As of 11/2018, pt considering sleeve gastectomy vs bipass as of eval by Dr. De Burrs considering as of 12/2019.  Marland Kitchen Nonalcoholic steatohepatitis    Viral Hep screens NEG.  CT 2015.  Transaminasemia.  U/S 07/2018 showed early changes of cirrhosis.  . OSA (obstructive sleep apnea) 09/14/2015   sleep study 09/07/15: severe obstructive sleep apnea with an AHI of 72 and SaO2 low of 75%.>refer  to sleep med for eval and tx options  . Osteoarthritis    hips, shoulders, knees  . PAF (paroxysmal atrial fibrillation) ([place])    One documented episode (after getting EGD 2016).  Was on amiodarone x 3 mo.  Rate control with metoprolol + anticoag with xarelto.   . Recurrent epistaxis    Granuloma in L nare cauterized by ENT 04/2020. Another cautery 06/2020  . Small fiber neuropathy    Due to DM.  Symmetric hands and feet tingling/numbness.  Marland Kitchen Ulcerative colitis ([place])    Remicade infusion Q 8 weeks: in clinical and endoscopic remission as of 12/2018 GI f/u.  06/11/19 rpt colonoscopy->cecal and ascending colon colitis.   Past Surgical History:  Procedure Laterality Date  . ABDOMINAL HYSTERECTOMY  1980   Paps no longer indicated.  Marland Kitchen BACTERIAL OVERGROWTH TEST N/A 07/13/2015   Procedure: BACTERIAL OVERGROWTH TEST;   Surgeon: Rogene Houston, MD;  [facility];  Service: Endoscopy;  Laterality: N/A;  730    . BILATERAL SALPINGOOPHORECTOMY  02/10/2001  . BIOPSY  06/11/2019   Procedure: BIOPSY;  Surgeon: Rogene Houston, MD;  [facility];  Service: Endoscopy;;  colon  . BIOPSY  09/27/2019   Procedure: BIOPSY;  Surgeon: Rogene Houston, MD;  [facility];  Service: Endoscopy;;  gastric duodenal  . BIOPSY  09/15/2020   Procedure: BIOPSY;  Surgeon: Harvel Quale, MD;  [facility];  Service: Gastroenterology;;  . [place]  . CARDIOVASCULAR STRESS TEST  07/2010   Lexiscan myoview: normal  . CARPAL TUNNEL RELEASE Right 1996  . CARPAL TUNNEL RELEASE Left 03/21/2003  . CARPAL TUNNEL RELEASE Right 05/04/2013   Procedure: CARPAL TUNNEL RELEASE;  Surgeon: Cammie Sickle., MD;  Location: [facility];  Service: Orthopedics;  Laterality: Right;  . CARPAL TUNNEL RELEASE Left 09/21/2013   Procedure: LEFT CARPAL TUNNEL RELEASE;  Surgeon: Cammie Sickle., MD;  Location: [facility];  Service: Orthopedics;  Laterality: Left;  . CHOLECYSTECTOMY    . COLONOSCOPY WITH PROPOFOL N/A 08/04/2015   Colitis in remission.  No polyps.  Procedure: COLONOSCOPY WITH PROPOFOL;  Surgeon: Rogene Houston, MD;  [facility];  Service: Endoscopy;  Laterality: N/A;  cecum time in  0820   time out  0827    total time 7 minutes  . COLONOSCOPY WITH PROPOFOL N/A 06/11/2019   cecal and ascending colon colitis.  Procedure: COLONOSCOPY WITH PROPOFOL;  Surgeon: Rogene Houston, MD;  [facility];  Service: Endoscopy;  Laterality: N/A;  730a  . COLONOSCOPY WITH PROPOFOL N/A 09/15/2020   Procedure: COLONOSCOPY WITH PROPOFOL;  Surgeon: Harvel Quale, MD;  [facility];  Service: Gastroenterology;  Laterality: N/A;  1030  . ESOPHAGEAL DILATION N/A 08/04/2015   Procedure: ESOPHAGEAL DILATION;   Surgeon: Rogene Houston, MD;  [facility];  Service: Endoscopy;  Laterality: N/A;  [place] 57, no mucousal disruption  . ESOPHAGOGASTRODUODENOSCOPY  09/27/2019   Performed for IDA.  Esoph dilation was done but no stricture present.  Mild portal hypertensive gastropathy, o/w normal.  Duodenal bx NEG.  h pylori neg.  Marland Kitchen ESOPHAGOGASTRODUODENOSCOPY (EGD) WITH ESOPHAGEAL DILATION  12/02/2005  . ESOPHAGOGASTRODUODENOSCOPY (EGD) WITH PROPOFOL N/A 08/04/2015   Procedure: ESOPHAGOGASTRODUODENOSCOPY (EGD) WITH PROPOFOL;  Surgeon: Rogene Houston, MD;  [facility];  Service: Endoscopy;  Laterality: N/A;  procedure 1  . ESOPHAGOGASTRODUODENOSCOPY (EGD) WITH  PROPOFOL N/A 09/27/2019   Procedure: ESOPHAGOGASTRODUODENOSCOPY (EGD) WITH PROPOFOL;  Surgeon: Rogene Houston, MD;  [facility];  Service: Endoscopy;  Laterality: N/A;  12:10  . FLEXIBLE SIGMOIDOSCOPY  01/17/2012   Procedure: FLEXIBLE SIGMOIDOSCOPY;  Surgeon: Rogene Houston, MD;  [facility];  Service: Endoscopy;  Laterality: N/A;  . GIVENS CAPSULE STUDY N/A 08/03/2019   Procedure: GIVENS CAPSULE STUDY (performed for IDA)->some food debris in stomach and small amount of blood.  Surgeon: Rogene Houston, MD;  [facility];  Service: Endoscopy;  Laterality: N/A;  730AM  . HEMILAMINOTOMY LUMBAR SPINE Bilateral 09/07/1999   L4-5  . KNEE ARTHROSCOPY Right 01/1999; 10/2000  . LYSIS OF ADHESION  02/10/2001  . MALONEY DILATION  09/27/2019   Procedure: MALONEY DILATION;  Surgeon: Rogene Houston, MD;  [facility];  Service: Endoscopy;;  . [place]; 09/12/2006  . TARSAL TUNNEL RELEASE  2002  . TRANSTHORACIC ECHOCARDIOGRAM  08/04/2015   EF 60-65%, normal wall motion, mild LVH, mild LA dilation, grd I DD.  Marland Kitchen TUMOR EXCISION Left 03/21/2003   dorsal 1st web space (hand)  . URETEROLYSIS Right 02/10/2001   Family History  Problem Relation Age of Onset  . Diabetes Mother   . Hypertension Mother   .  Heart attack Father        Mid 16's  . Heart disease Father   . Lung disease Father        spot on lung; had lung surgery  . Alcohol abuse Other   . Hypertension Son   . Diabetes Son   . Emphysema Maternal Grandfather   . Asthma Maternal Grandfather   . Colon cancer Paternal Grandfather   . Stomach cancer Paternal Grandmother   . Neuropathy Neg Hx    Social History   Socioeconomic History  . Marital status: Married    Spouse name: Not on file  . Number of children: Not on file  . Years of education: Not on file  . Highest education level: Not on file  Occupational History  . Occupation: Retired  Tobacco Use  . Smoking status: Never Smoker  . Smokeless tobacco: Never Used  Vaping Use  . Vaping Use: Never used  Substance and Sexual Activity  . Alcohol use: Not Currently    Comment: ocassionally  . Drug use: No  . Sexual activity: Not Currently    Partners: Male    Birth control/protection: Surgical    Comment: hysterectomy  Other Topics Concern  . Not on file  Social History Narrative   She lives with husband in two-story home.  They have one grown son and 2 grandchildren.   She is retired [employer].   Highest of level education:  Some college.   Never smoker.   Alcohol: rare.   Social Determinants of Health   Financial Resource Strain: Medium Risk  . Difficulty of Paying Living Expenses: Somewhat hard  Food Insecurity: No Food Insecurity  . Worried About Charity fundraiser in the Last Year: Never true  . Ran Out of Food in the Last Year: Never true  Transportation Needs: No Transportation Needs  . Lack of Transportation (Medical): No  . Lack of Transportation (Non-Medical): No  Physical Activity: Inactive  . Days of Exercise per Week: 0 days  . Minutes of Exercise per Session: 0 min  Stress: No Stress Concern Present  . Feeling of Stress : Not at all  Social Connections: Moderately  Isolated  . Frequency of Communication with Friends and Family:  More than three times a week  . Frequency of Social Gatherings with Friends and Family: Once a week  . Attends Religious Services: Never  . Active Member of Clubs or Organizations: No  . Attends Archivist Meetings: Never  . Marital Status: Married    Tobacco Counseling Counseling given: Not Answered   Clinical Intake:  Pre-visit preparation completed: Yes  Pain : No/denies pain     Nutritional Status: BMI > 30  Obese Nutritional Risks: None Diabetes: Yes CBG done?: No Did pt. bring in CBG monitor from home?: No (phone visit)     Diabetes:  Is the patient diabetic?  Yes  If diabetic, was a CBG obtained today?  No  Did the patient bring in their glucometer from home?  No phone visit How often do you monitor your CBG's? Twice daily.   Financial Strains and Diabetes Management:  Are you having any financial strains with the device, your supplies or your medication? Yes . Community resource referral placed today Does the patient want to be seen by Chronic Care Management for management of their diabetes?  No  Would the patient like to be referred to a Nutritionist or for Diabetic Management?  No   Diabetic Exams:  Diabetic Eye Exam: Completed 06/22/2020 .   Diabetic Foot Exam: Completed 01/13/2020.   Interpreter Needed?: No  Information entered by :: Caroleen Hamman LPN   Activities of Daily Living In your present state of health, do you have any difficulty performing the following activities: 11/29/2020 09/13/2020  Hearing? N N  Vision? N N  Difficulty concentrating or making decisions? N N  Walking or climbing stairs? N N  Dressing or bathing? N N  Doing errands, shopping? N N  Preparing Food and eating ? N -  Using the Toilet? N -  In the past six months, have you accidently leaked urine? N -  Do you have problems with loss of bowel control? Y -  Comment occasionally due to [facility] colitis -  Managing your Medications? N -  Managing your  Finances? N -  Housekeeping or managing your Housekeeping? N -  Some recent data might be hidden    Patient Care Team: Tammi Sou, MD as PCP - General (Family Medicine) Stanford Breed, Denice Bors, MD as PCP - Cardiology (Cardiology) Rogene Houston, MD (Gastroenterology) Lelon Perla, MD as Consulting Physician (Cardiology) Clent Jacks, MD as Consulting Physician (Ophthalmology) Megan Salon, MD (Obstetrics and Gynecology) Theodis Sato, MD (Inactive) (Orthopedic Surgery) Levy Sjogren, MD as Referring Physician (Dermatology) Leeroy Cha, MD as Consulting Physician (Neurosurgery) Hennie Duos, MD as Consulting Physician (Rheumatology) Jerrell Belfast, MD as Consulting Physician (Otolaryngology)  Indicate any recent Medical Services you may have received from other than [facility] providers in the past year (date may be approximate).     Assessment:   This is a routine wellness examination for Navya.  Hearing/Vision screen  Hearing Screening   125Hz  250Hz  500Hz  1000Hz  2000Hz  3000Hz  4000Hz  6000Hz  8000Hz   Right ear:           Left ear:           Comments: No issues  Vision Screening Comments: Last eye exam-05/2020-Dr. Groat  Dietary issues and exercise activities discussed: Current Exercise Habits: The patient does not participate in regular exercise at present, Exercise limited by: None identified  Goals    . Patient Stated  Maintain current health      Depression Screen PHQ 2/9 Scores 11/29/2020 08/18/2020 04/21/2019 02/24/2019 10/15/2018 06/29/2018 03/27/2018  PHQ - 2 Score 0 0 0 0 0 0 0  PHQ- 9 Score - - - - - 6 1    Fall Risk Fall Risk  11/29/2020 04/18/2020 02/24/2019 10/15/2018 09/26/2017  Falls in the past year? 1 1 0 1 No  Comment - - - - -  Number falls in past yr: 1 1 - 0 -  Injury with Fall? 0 0 - 1 -  Risk for fall due to : History of fall(s) - - - -  Follow up Falls prevention discussed Falls evaluation completed - - -    FALL RISK  PREVENTION PERTAINING TO THE HOME:  Any stairs in or around the home? Yes  If so, are there any without handrails? No  Home free of loose throw rugs in walkways, pet beds, electrical cords, etc? Yes  Adequate lighting in your home to reduce risk of falls? Yes   ASSISTIVE DEVICES UTILIZED TO PREVENT FALLS:  Life alert? No  Use of a cane, walker or w/c? No  Grab bars in the bathroom? Yes  Shower chair or bench in shower? No  Elevated toilet seat or a handicapped toilet? No   TIMED UP AND GO:  Was the test performed? No . Phone visit   Cognitive Function:Normal cognitive status assessed by this Nurse Health Advisor. No abnormalities found.          Immunizations Immunization History  Administered Date(s) Administered  . Fluad Quad(high Dose 65+) 08/18/2019, 08/18/2020  . Hepatitis B, adult 10/26/2020  . Influenza Split 08/17/2012  . Influenza Whole 06/30/2009, 07/12/2010  . Influenza, High Dose Seasonal PF 08/29/2015, 09/09/2016, 08/13/2017, 06/29/2018  . Influenza,inj,Quad PF,6+ Mos 06/08/2013, 08/18/2014  . Moderna Sars-Covid-2 Vaccination 12/07/2019, 01/11/2020  . Pneumococcal Conjugate-13 08/18/2014  . Pneumococcal Polysaccharide-23 11/26/2016  . Td 09/30/2008  . Tdap 04/16/2019  . Zoster 08/17/2012    TDAP status: Up to date  Flu Vaccine status: Up to date  Pneumococcal vaccine status: Up to date  Covid-19 vaccine status: Information provided on how to obtain vaccines. Booster due  Qualifies for Shingles Vaccine? Yes   Zostavax completed Yes   Shingrix Completed?: No.    Education has been provided regarding the importance of this vaccine. Patient has been advised to call insurance company to determine out of pocket expense if they have not yet received this vaccine. Advised may also receive vaccine at local pharmacy or Health Dept. Verbalized acceptance and understanding.  Screening Tests Health Maintenance  Topic Date Due  . COVID-19 Vaccine (3 - Moderna  risk 4-dose series) 12/24/2020 (Originally 02/08/2020)  . FOOT EXAM  01/12/2021  . MAMMOGRAM  04/28/2021  . HEMOGLOBIN A1C  05/23/2021  . OPHTHALMOLOGY EXAM  06/22/2021  . URINE MICROALBUMIN  08/18/2021  . COLONOSCOPY (Pts 45-49yr Insurance coverage will need to be confirmed)  09/15/2025  . TETANUS/TDAP  04/15/2029  . INFLUENZA VACCINE  Completed  . DEXA SCAN  Completed  . Hepatitis C Screening  Completed  . PNA vac Low Risk Adult  Completed  . HPV VACCINES  Aged Out    Health Maintenance  There are no preventive care reminders to display for this patient.  Colorectal cancer screening: Type of screening: Colonoscopy. Completed 09/15/2020. Repeat every 5 years   Mammogram status: Due-Patient states GYN will order.  Bone Density status: Completed 04/29/2019. Results reflect: Bone density results: OSTEOPENIA. Repeat every  2 years.  Lung Cancer Screening: (Low Dose CT Chest recommended if Age 39-80 years, 30 pack-year currently smoking OR have quit w/in 15years.) does not qualify.    Additional Screening:  Hepatitis C Screening: Completed 09/13/2020  Vision Screening: Recommended annual ophthalmology exams for early detection of glaucoma and other disorders of the eye. Is the patient up to date with their annual eye exam?  Yes  Who is the provider or what is the name of the office in which the patient attends annual eye exams? Dr. [name] Fitch   Dental Screening: Recommended annual dental exams for proper oral hygiene  Community Resource Referral / Chronic Care Management: CRR required this visit?  Yes  For financial assistance with cost of  medications  CCM required this visit?  No      Plan:     I have personally reviewed and noted the following in the patient's chart:   . Medical and social history . Use of alcohol, tobacco or illicit drugs  . Current medications and supplements . Functional ability and status . Nutritional status . Physical activity . Advanced  directives . List of other physicians . Hospitalizations, surgeries, and ER visits in previous 12 months . Vitals . Screenings to include cognitive, depression, and falls . Referrals and appointments  In addition, I have reviewed and discussed with patient certain preventive protocols, quality metrics, and best practice recommendations. A written personalized care plan for preventive services as well as general preventive health recommendations were provided to patient.   Due to this being a telephonic visit, the after visit summary with patients personalized plan was offered to patient via mail or my-chart. Patient declined at this time.   Marta Antu, LPN   10/05/1094  Nurse Health Advisor  Nurse Notes: none

## 2020-12-01 ENCOUNTER — Telehealth (INDEPENDENT_AMBULATORY_CARE_PROVIDER_SITE_OTHER): Payer: Self-pay | Admitting: *Deleted

## 2020-12-01 ENCOUNTER — Telehealth: Payer: Self-pay | Admitting: Family Medicine

## 2020-12-01 NOTE — Telephone Encounter (Signed)
   Telephone encounter was:  Successful.  12/01/2020 Name: [name] MRN: [id number] DOB: 09/09/1946  [name] is a 75 y.o. year old female who is a primary care patient of McGowen, Adrian Blackwater, MD . The community resource team was consulted for assistance with Medication Assistance  Care guide performed the following interventions: Discussed resources to assist with medications. Patient stated that she and her husband need assistanc with insulin medications. Care Guide informed patient that there may be some patient assistance programs for [employer] and [employer]. Patient stated understanding. Care Guide will research to see if there are any other medication resources that can assist the patient. .  Follow Up Plan:  Care guide will follow up with patient by phone over the next week.   [place], Care Management Phone: [phone] Email: [email or web address]

## 2020-12-01 NOTE — Telephone Encounter (Signed)
Patient has been calling the office since March 2 nd. I have returned her calls but it will go straight to her voicemail. She left a message asking that we mot leave a message just keep calling till someone answers. This morning I called back and it went directly to her voice mail.  Per her original message this is about a medication that Dr. Jenetta Downer prescribed and a medication that Dr. Laural [name] gave her a few months back , she is wanting a refill on it.  I left another voice mail. Ask her to call the office back.

## 2020-12-05 ENCOUNTER — Telehealth: Payer: Self-pay | Admitting: Family Medicine

## 2020-12-05 NOTE — Telephone Encounter (Signed)
   Telephone encounter was:  Unsuccessful.  12/05/2020 Name: [name] MRN: [id number] DOB: [birth year]  Unsuccessful outbound call made today to assist with:  RX Assistance  Outreach Attempt:  2nd Attempt  A HIPAA compliant voice message was left requesting a return call.  Instructed patient to call back at [phone].  [place], Care Management Phone: [phone] Email: [email or web address]

## 2020-12-06 DIAGNOSIS — K5180 Other ulcerative colitis without complications: Secondary | ICD-10-CM | POA: Diagnosis not present

## 2020-12-08 ENCOUNTER — Telehealth: Payer: Self-pay | Admitting: Family Medicine

## 2020-12-08 NOTE — Telephone Encounter (Signed)
   Telephone encounter was:  Successful.  12/08/2020 Name: [name] MRN: [id number] DOB: 18-Sep-1946  [name] is a 75 y.o. year old female who is a primary care patient of McGowen, Adrian Blackwater, MD . The community resource team was consulted for assistance with Medication Assistance  Care guide performed the following interventions: Discussed resources to assist with medication assistance. Patient stated she will stop by the office on Monday, March 14th to pick up the patient assistance program documents for herself and her husband. Care Guide will send information to [facility] Osie Bond to print or patient and give to front office. .  Follow Up Plan:  No further follow up planned at this time. The patient has been provided with needed resources.  [place], Care Management Phone: [phone] Email: [email or web address]

## 2020-12-11 ENCOUNTER — Telehealth: Payer: Self-pay | Admitting: Family Medicine

## 2020-12-11 NOTE — Telephone Encounter (Signed)
Referral Reason: Medication Assistance   Interventions: Sent e-mail to [facility], Clinical Registration at [facility] to print out forms and give to patient.   Follow up plan: No further follow up planned at this time. The patient has been provided with needed resources.

## 2020-12-11 NOTE — Telephone Encounter (Signed)
Patient dropped off [facility] paperwork for Dr. Anitra Lauth to fill out. Placed in Dr. Idelle Leech inbox in front office.

## 2020-12-12 NOTE — Telephone Encounter (Signed)
On my desk to start completion prior to placing on PCP desk

## 2020-12-12 NOTE — Telephone Encounter (Signed)
Placed on PCP desk to review and sign, if appropriate.  

## 2020-12-14 NOTE — Telephone Encounter (Signed)
Per patient request, mailed forms to her. Copies sent to scanning.

## 2021-01-04 ENCOUNTER — Other Ambulatory Visit: Payer: Self-pay | Admitting: Family Medicine

## 2021-01-10 ENCOUNTER — Telehealth: Payer: Self-pay

## 2021-01-10 ENCOUNTER — Other Ambulatory Visit: Payer: Self-pay

## 2021-01-10 MED ORDER — HUMULIN R U-500 KWIKPEN 500 UNIT/ML ~~LOC~~ SOPN: 125.0000 [IU] | PEN_INJECTOR | SUBCUTANEOUS | 3 refills | Status: DC

## 2021-01-10 MED ORDER — TRULICITY 1.5 MG/0.5ML ~~LOC~~ SOAJ: SUBCUTANEOUS | 3 refills | Status: DC

## 2021-01-10 NOTE — Telephone Encounter (Signed)
Received fax from [facility] that additional information needed for completion of application, prescription for requested medications. (Trulicity and Humulin R U-500) kwikpen. Faxed back to complete pt's application

## 2021-01-15 ENCOUNTER — Other Ambulatory Visit: Payer: Self-pay | Admitting: Cardiology

## 2021-01-31 DIAGNOSIS — K5180 Other ulcerative colitis without complications: Secondary | ICD-10-CM | POA: Diagnosis not present

## 2021-02-06 ENCOUNTER — Other Ambulatory Visit: Payer: Self-pay | Admitting: Family Medicine

## 2021-02-15 ENCOUNTER — Telehealth (INDEPENDENT_AMBULATORY_CARE_PROVIDER_SITE_OTHER): Payer: Self-pay | Admitting: *Deleted

## 2021-02-15 NOTE — Telephone Encounter (Signed)
[facility] needs updated order for Remicade - fax# [phone]

## 2021-02-16 NOTE — Telephone Encounter (Signed)
Will fax updated order to [facility] when completed.

## 2021-02-21 ENCOUNTER — Ambulatory Visit: Payer: Medicare PPO | Admitting: Family Medicine

## 2021-02-21 ENCOUNTER — Other Ambulatory Visit: Payer: Self-pay

## 2021-02-21 ENCOUNTER — Encounter: Payer: Self-pay | Admitting: Family Medicine

## 2021-02-21 VITALS — BP 130/72 | HR 92 | Temp 97.8°F | Resp 16 | Ht 62.0 in | Wt 238.0 lb

## 2021-02-21 DIAGNOSIS — Z23 Encounter for immunization: Secondary | ICD-10-CM

## 2021-02-21 DIAGNOSIS — K7581 Nonalcoholic steatohepatitis (NASH): Secondary | ICD-10-CM

## 2021-02-21 DIAGNOSIS — Z794 Long term (current) use of insulin: Secondary | ICD-10-CM | POA: Diagnosis not present

## 2021-02-21 DIAGNOSIS — K7460 Unspecified cirrhosis of liver: Secondary | ICD-10-CM

## 2021-02-21 DIAGNOSIS — I480 Paroxysmal atrial fibrillation: Secondary | ICD-10-CM | POA: Diagnosis not present

## 2021-02-21 DIAGNOSIS — I10 Essential (primary) hypertension: Secondary | ICD-10-CM

## 2021-02-21 DIAGNOSIS — E1140 Type 2 diabetes mellitus with diabetic neuropathy, unspecified: Secondary | ICD-10-CM | POA: Diagnosis not present

## 2021-02-21 DIAGNOSIS — Z862 Personal history of diseases of the blood and blood-forming organs and certain disorders involving the immune mechanism: Secondary | ICD-10-CM | POA: Diagnosis not present

## 2021-02-21 MED ORDER — HUMULIN R U-500 KWIKPEN 500 UNIT/ML ~~LOC~~ SOPN: 125.0000 [IU] | PEN_INJECTOR | SUBCUTANEOUS | 1 refills | Status: DC

## 2021-02-21 MED ORDER — TRULICITY 1.5 MG/0.5ML ~~LOC~~ SOAJ
SUBCUTANEOUS | 2 refills | Status: DC
Start: 2021-02-21 — End: 2021-08-21

## 2021-02-21 NOTE — Progress Notes (Signed)
Office Note 02/21/2021  CC:  Chief Complaint  Patient presents with  . Follow-up    DM 2, HTN. Pt is not fasting    HPI:  [name] is a 75 y.o. White female who is here for 3 mo f/u DM 2 with complication of DPN, HTN, HLD, PAF, cirrhosis d/t NASH. A/P as of last visit: "1) sDM, home glucoses erratic.  A1c has been 7 to 7.5% range over the last 18 mo. A1c 7.0% today. Cont U500 insulin 125 qAM, 135 qLunch, and dec supper dose to 120 to try to dec chances of the early morning hypoglycemia she has tended to have.  No hs insulin should be given. Cont trulicity q week. Discussed possible endo referral and we'll keep this in mind going forward but hold off for now.  2) HTN: not ideal control but erring a little on high side since she has tendency to get some random/unexplained lows. No changes today (toprol xl 157m, 1 bid).  3) NASH Cirrhosis: transaminases have been stable. Platelets stable. Hep B vaccine #1 given 10/20/20. Need to continue with this vaccine series.  4) Morbid obesity: was in process of getting bariatric surg (nutritionist visits, + was going to get psych eval and then possibly get scheduled for surg --as per my 08/18/19 f/u note). Her insurer changed and per pt's conversations with them it does not look promising that they will be covering bariatric surgery.  She'll hold off from getting back in with bariatric surg at this time.  5) Hist of IDA: GI endo eval incl givens study all neg for site of bleeding. Suspected due to iron malabs from chronic PPI +portal hypertensive gastroduodenopathy, plus some contribution from recurrent epistaxis. Epistaxis no longer an issue since having nasal cautery. Hb stable last visit at 13.3."  INTERIM HX: Doing well.  Home bp's occ check: 130s/80s  DM: 125 U500 qAM, 125 U500 q lunch, 125 U qsupper, none at bedtime. No hypoglycemic episodes since last visit. Has some normal gluc's, some in 100s, some in 200s, less in  300s, no discernable pattern. Says diabetic diet is consistent day to day.  Says she is sedentary other than doing some light housework.    Says she is not taking iron, says she has never seen it on her medlist before.  Hx of chronic diarrhea, colestepol was rx'd by GI but pt says the med caused to much upper abd gas/bloating.  She stopped the med, started align and says all GI sx's improved.  Taking xarelto, still having nosebleeds, most recent was 4 n/a, lasted 30 min. Has small amount of nose bleeding each morning after taking off her cpap. She has had her nose cauterized by ENT x 2.  Denies any heart racing or palpitations.    ROS as above, plus--> no fevers, no CP, no SOB, no wheezing, no cough, no dizziness, no HAs, no rashes, no melena/hematochezia.  No polyuria or polydipsia.  No myalgias or arthralgias.  No focal weakness, paresthesias, or tremors.  No acute vision or hearing abnormalities.  No dysuria or unusual/new urinary urgency or frequency.  No recent changes in lower legs. No n/v/d or abd pain.  No palpitations.    Past Medical History:  Diagnosis Date  . 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 Cirrhosis, nonalcoholic ([place] 129/9242  NASH--> early cirrhotic changes on ultrasound 07/2018. ? to get liver bx if she gets bariatric surgery? Mild portal hypertensive gastropathy on EGD  08/2019.  Marland Kitchen Fibromyalgia   . GAD (generalized anxiety disorder)   . GERD   . History of iron deficiency anemia 12/2018   Possibly inadequate absorption secondary to chronic/long term PPI therapy + portal hypertensive gastroduodenopathy. No GIB found on EGD, colonosc, and givens. Oral iron restarted 12/2019--unclear compliance.  Marland Kitchen History of thrombocytopenia 12/2011  . Hyperlipidemia    Intolerant of statins  . HYPERTENSION   . IBS (irritable bowel syndrome)    -D.  Good response to bentyl and imodium as of 06/2018 GI f/u.  Marland Kitchen IDDM (insulin dependent diabetes mellitus)    with  DPN (managed by Dr. Cruzita Lederer but then in 2018 pt preferred to have me manage for her convenience)  . Limited mobility    Requires a walker for arthritic pain, widespread musculoskeletal pain, and neuropathic pain.  . Morbid obesity ([place])    As of 11/2018, pt considering sleeve gastectomy vs bipass as of eval by Dr. De Burrs considering as of 12/2019.  Marland Kitchen Nonalcoholic steatohepatitis    Viral Hep screens NEG.  CT 2015.  Transaminasemia.  U/S 07/2018 showed early changes of cirrhosis.  . OSA (obstructive sleep apnea) 09/14/2015   sleep study 09/07/15: severe obstructive sleep apnea with an AHI of 72 and SaO2 low of 75%.>refer to sleep med for eval and tx options  . Osteoarthritis    hips, shoulders, knees  . PAF (paroxysmal atrial fibrillation) ([place])    One documented episode (after getting EGD 2016).  Was on amiodarone x 3 mo.  Rate control with metoprolol + anticoag with xarelto.   . Recurrent epistaxis    Granuloma in L nare cauterized by ENT 04/2020. Another cautery 06/2020  . Small fiber neuropathy    Due to DM.  Symmetric hands and feet tingling/numbness.  Marland Kitchen Ulcerative colitis ([place])    Remicade infusion Q 8 weeks: in clinical and endoscopic remission as of 12/2018 GI f/u.  06/11/19 rpt colonoscopy->cecal and ascending colon colitis.    Past Surgical History:  Procedure Laterality Date  . ABDOMINAL HYSTERECTOMY  1980   Paps no longer indicated.  Marland Kitchen BACTERIAL OVERGROWTH TEST N/A 07/13/2015   Procedure: BACTERIAL OVERGROWTH TEST;  Surgeon: Rogene Houston, MD;  [facility];  Service: Endoscopy;  Laterality: N/A;  730    . BILATERAL SALPINGOOPHORECTOMY  02/10/2001  . BIOPSY  06/11/2019   Procedure: BIOPSY;  Surgeon: Rogene Houston, MD;  [facility];  Service: Endoscopy;;  colon  . BIOPSY  09/27/2019   Procedure: BIOPSY;  Surgeon: Rogene Houston, MD;  [facility];  Service: Endoscopy;;  gastric duodenal  . BIOPSY  09/15/2020   Procedure: BIOPSY;   Surgeon: Harvel Quale, MD;  [facility];  Service: Gastroenterology;;  . [place]  . CARDIOVASCULAR STRESS TEST  07/2010   Lexiscan myoview: normal  . CARPAL TUNNEL RELEASE Right 1996  . CARPAL TUNNEL RELEASE Left 03/21/2003  . CARPAL TUNNEL RELEASE Right 05/04/2013   Procedure: CARPAL TUNNEL RELEASE;  Surgeon: Cammie Sickle., MD;  Location: [place];  Service: Orthopedics;  Laterality: Right;  . CARPAL TUNNEL RELEASE Left 09/21/2013   Procedure: LEFT CARPAL TUNNEL RELEASE;  Surgeon: Cammie Sickle., MD;  Location: [place];  Service: Orthopedics;  Laterality: Left;  . CHOLECYSTECTOMY    . COLONOSCOPY WITH PROPOFOL N/A 08/04/2015   Colitis in remission.  No polyps.  Procedure: COLONOSCOPY WITH PROPOFOL;  Surgeon: Rogene Houston, MD;  Location: [facility];  Service: Endoscopy;  Laterality: N/A;  cecum time in  0820   time out  0827    total time 7 minutes  . COLONOSCOPY WITH PROPOFOL N/A 06/11/2019   cecal and ascending colon colitis.  Procedure: COLONOSCOPY WITH PROPOFOL;  Surgeon: Rogene Houston, MD;  [facility];  Service: Endoscopy;  Laterality: N/A;  730a  . COLONOSCOPY WITH PROPOFOL N/A 09/15/2020   Procedure: COLONOSCOPY WITH PROPOFOL;  Surgeon: Harvel Quale, MD;  [facility];  Service: Gastroenterology;  Laterality: N/A;  1030  . ESOPHAGEAL DILATION N/A 08/04/2015   Procedure: ESOPHAGEAL DILATION;  Surgeon: Rogene Houston, MD;  [facility]: [facility];  Service: Endoscopy;  Laterality: N/A;  [place] 56, no mucousal disruption  . ESOPHAGOGASTRODUODENOSCOPY  09/27/2019   Performed for IDA.  Esoph dilation was done but no stricture present.  Mild portal hypertensive gastropathy, o/w normal.  Duodenal bx NEG.  h pylori neg.  Marland Kitchen ESOPHAGOGASTRODUODENOSCOPY (EGD) WITH ESOPHAGEAL DILATION  12/02/2005  . ESOPHAGOGASTRODUODENOSCOPY (EGD) WITH PROPOFOL N/A 08/04/2015   Procedure:  ESOPHAGOGASTRODUODENOSCOPY (EGD) WITH PROPOFOL;  Surgeon: Rogene Houston, MD;  [facility]: [facility];  Service: Endoscopy;  Laterality: N/A;  procedure 1  . ESOPHAGOGASTRODUODENOSCOPY (EGD) WITH PROPOFOL N/A 09/27/2019   Procedure: ESOPHAGOGASTRODUODENOSCOPY (EGD) WITH PROPOFOL;  Surgeon: Rogene Houston, MD;  [facility];  Service: Endoscopy;  Laterality: N/A;  12:10  . FLEXIBLE SIGMOIDOSCOPY  01/17/2012   Procedure: FLEXIBLE SIGMOIDOSCOPY;  Surgeon: Rogene Houston, MD;  [facility];  Service: Endoscopy;  Laterality: N/A;  . GIVENS CAPSULE STUDY N/A 08/03/2019   Procedure: GIVENS CAPSULE STUDY (performed for IDA)->some food debris in stomach and small amount of blood.  Surgeon: Rogene Houston, MD;  [facility];  Service: Endoscopy;  Laterality: N/A;  730AM  . HEMILAMINOTOMY LUMBAR SPINE Bilateral 09/07/1999   L4-5  . KNEE ARTHROSCOPY Right 01/1999; 10/2000  . LYSIS OF ADHESION  02/10/2001  . MALONEY DILATION  09/27/2019   Procedure: MALONEY DILATION;  Surgeon: Rogene Houston, MD;  [facility];  Service: Endoscopy;;  . [place]; 09/12/2006  . TARSAL TUNNEL RELEASE  2002  . TRANSTHORACIC ECHOCARDIOGRAM  08/04/2015   EF 60-65%, normal wall motion, mild LVH, mild LA dilation, grd I DD.  Marland Kitchen TUMOR EXCISION Left 03/21/2003   dorsal 1st web space (hand)  . URETEROLYSIS Right 02/10/2001    Family History  Problem Relation Age of Onset  . Diabetes Mother   . Hypertension Mother   . Heart attack Father        Mid 63's  . Heart disease Father   . Lung disease Father        spot on lung; had lung surgery  . Alcohol abuse Other   . Hypertension Son   . Diabetes Son   . Emphysema Maternal Grandfather   . Asthma Maternal Grandfather   . Colon cancer Paternal Grandfather   . Stomach cancer Paternal Grandmother   . Neuropathy Neg Hx     Social History   Socioeconomic History  . Marital status: Married    Spouse name: Not on file  .  Number of children: Not on file  . Years of education: Not on file  . Highest education level: Not on file  Occupational History  . Occupation: Retired  Tobacco Use  . Smoking status: Never Smoker  . Smokeless tobacco: Never Used  Vaping Use  . Vaping Use: Never used  Substance and Sexual Activity  . Alcohol use: Not Currently    Comment: ocassionally  . Drug use: No  . Sexual activity: Not Currently    Partners: Male    Birth control/protection: Surgical    Comment: hysterectomy  Other Topics Concern  . Not on file  Social History Narrative   She lives with husband in two-story home.  They have one grown son and 2 grandchildren.   She is retired [employer].   Highest of level education:  Some college.   Never smoker.   Alcohol: rare.   Social Determinants of Health   Financial Resource Strain: Medium Risk  . Difficulty of Paying Living Expenses: Somewhat hard  Food Insecurity: No Food Insecurity  . Worried About Charity fundraiser in the Last Year: Never true  . Ran Out of Food in the Last Year: Never true  Transportation Needs: No Transportation Needs  . Lack of Transportation (Medical): No  . Lack of Transportation (Non-Medical): No  Physical Activity: Inactive  . Days of Exercise per Week: 0 days  . Minutes of Exercise per Session: 0 min  Stress: No Stress Concern Present  . Feeling of Stress : Not at all  Social Connections: Moderately Isolated  . Frequency of Communication with Friends and Family: More than three times a week  . Frequency of Social Gatherings with Friends and Family: Once a week  . Attends Religious Services: Never  . Active Member of Clubs or Organizations: No  . Attends Archivist Meetings: Never  . Marital Status: Married  Human resources officer Violence: Not At Risk  . Fear of Current or Ex-Partner: No  . Emotionally Abused: No  . Physically Abused: No  . Sexually Abused: No    Outpatient Medications Prior to Visit   Medication Sig Dispense Refill  . ACCU-CHEK GUIDE test strip USE TO CHECK BLOOD GLUCOSE UP TO 6 TIMES DAILY AS DIRECTED 200 strip 1  . Accu-Chek Softclix Lancets lancets USE TO CHECK BLOOD GLUCOSE UP TO 6 TIMES DAILY AS DIRECTED 200 each 1  . B-D UF III MINI PEN NEEDLES 31G X 5 MM MISC USE TO INJECT INSULINS EQUAL TO 6 TIMES DAILY. 600 each 5  . blood glucose meter kit and supplies KIT Use up to six times daily as directed. DX. E11.9 1 each 0  . citalopram (CELEXA) 20 MG tablet TAKE 1 TABLET BY MOUTH EVERY DAY 90 tablet 3  . Cyanocobalamin (B-12) 5000 MCG CAPS Take 5,000 mcg by mouth daily.    . Dulaglutide (TRULICITY) 1.5 ZO/1.0RU SOPN INJECT 1.5MG ONCE WEEKLY 6 mL 3  . esomeprazole (NEXIUM) 20 MG capsule Take 20 mg by mouth daily.     . ferrous sulfate 325 (65 FE) MG tablet Take 1 tablet (325 mg total) by mouth 2 (two) times daily with a meal. (Patient taking differently: Take 325 mg by mouth daily.)    . furosemide (LASIX) 40 MG tablet TAKE 1 TABLET BY MOUTH EVERY DAY 90 tablet 3  . gabapentin (NEURONTIN) 600 MG tablet TAKE 1 AND 1/2 TABLETS BY MOUTH TWICE A DAY 270 tablet 3  . inFLIXimab (REMICADE) 100 MG injection Inject 100 mg into the vein every 8 (eight) weeks.     . insulin regular human CONCENTRATED (HUMULIN R U-500 KWIKPEN) 500 UNIT/ML kwikpen Inject 125-135 Units into the skin See admin instructions. Inject 125 units with breakfast, 135 units with lunch, and 125 units  with supper. 15 mL 3  . KLOR-CON M10 10 MEQ tablet TAKE 1 TABLET BY MOUTH EVERY DAY 90 tablet 3  . lactase (LACTAID) 3000 UNITS tablet Take 3,000 Units by mouth as needed (when eating foods containing dairy).     . meclizine (ANTIVERT) 25 MG tablet Take 1 tablet (25 mg total) by mouth 3 (three) times daily as needed for dizziness or nausea. 90 tablet 0  . metoprolol succinate (TOPROL-XL) 100 MG 24 hr tablet TAKE 1 TABLET BY MOUTH 2 TIMES DAILY. TAKE WITH OR IMMEDIATELY FOLLOWING A MEAL 180 tablet 3  . Multiple Vitamin  (MULTIVITAMIN WITH MINERALS) TABS tablet Take 1 tablet by mouth daily.    . Polyvinyl Alcohol-Povidone (REFRESH OP) Place 1 drop into both eyes daily as needed (dry eyes).    . Probiotic Product (ALIGN PO) Take 1 tablet by mouth daily.    Alveda Reasons 20 MG TABS tablet TAKE 1 TABLET BY MOUTH DAILY WITH SUPPER. 90 tablet 1  . acetaminophen (TYLENOL) 325 MG tablet Take 650 mg by mouth every 6 (six) hours as needed for moderate pain or headache. (Patient not taking: Reported on 02/21/2021)    . colestipol (COLESTID) 1 g tablet Take 2 tablets (2 g total) by mouth daily. (Patient not taking: Reported on 02/21/2021) 180 tablet 3   No facility-administered medications prior to visit.    Allergies  Allergen Reactions  . Omeprazole Anaphylaxis and Swelling    SWELLING OF TONGUE AND THROAT  . Actos [Pioglitazone] Other (See Comments)    Weight gain  . Benzocaine-Menthol Swelling    SWELLING OF MOUTH  . Colesevelam Other (See Comments)    GI UPSET  . Flagyl [Metronidazole Hcl] Other (See Comments)    DIAPHORESIS  . Metformin And Related Diarrhea  . Shrimp [Shellfish Allergy] Itching    OF THROAT AND EARS  . Statins Palpitations  . Desipramine Hcl Itching, Nausea Only and Other (See Comments)    "swimmy" headed, ears itched   . Hydromorphone Itching  . Jardiance [Empagliflozin] Other (See Comments)    weakness  . Lactose Intolerance (Gi) Diarrhea    Gas, bloating  . Adhesive [Tape] Other (See Comments)    SKIN IRRITATION AND BRUISING  . Nisoldipine Itching  . Percocet [Oxycodone-Acetaminophen] Itching   PE; Vitals with BMI 02/21/2021 11/29/2020 11/23/2020  Height _0  _1  _2   Weight 238 lbs 237 lbs 237 lbs 3 oz  BMI 43.52 85.63 14.97  Systolic 026 - 378  Diastolic 72 - 72  Pulse 92 - 70    Gen: Alert, well appearing.  Patient is oriented to person, place, time, and situation. AFFECT: pleasant, lucid thought and speech. CV: Irreg irreg, rate about 80, no m/r/g.   LUNGS: CTA bilat,  nonlabored resps, good aeration in all lung fields. EXT: no clubbing or cyanosis.  3+ bilat LL pitting edema, lymphedema present as well, +fibrotic skin changes.   Pertinent labs:  Lab Results  Component Value Date   TSH 1.09 08/18/2020   Lab Results  Component Value Date   WBC 4.6 10/11/2020   HGB 13.3 10/11/2020   HCT 38 10/11/2020   MCV 99.1 08/18/2020   PLT 90 (A) 10/11/2020   Lab Results  Component Value Date   IRON 111 08/18/2020   TIBC 268 08/18/2020   FERRITIN 86 08/18/2020   Lab Results  Component Value Date   VITAMINB12 >2,000 (H) 01/26/2019   Lab Results  Component Value Date   CREATININE 0.8 10/11/2020  BUN 11 10/11/2020   NA 144 10/11/2020   K 4.3 10/11/2020   CL 106 10/11/2020   CO2 18 10/11/2020   Lab Results  Component Value Date   ALT 44 (A) 10/11/2020   AST 48 (A) 10/11/2020   ALKPHOS 80 10/11/2020   BILITOT 0.9 08/18/2020   Lab Results  Component Value Date   CHOL 202 (H) 04/18/2020   Lab Results  Component Value Date   HDL 41.50 04/18/2020   Lab Results  Component Value Date   LDLCALC 139 (H) 04/18/2020   Lab Results  Component Value Date   TRIG 108.0 04/18/2020   Lab Results  Component Value Date   CHOLHDL 5 04/18/2020   Lab Results  Component Value Date   HGBA1C 7.0 (A) 11/23/2020   HGBA1C 7.0 11/23/2020   HGBA1C 7.0 (A) 11/23/2020   HGBA1C 7.0 11/23/2020   ASSESSMENT AND PLAN:   1) DM2; glucoses erratic as per her usual but at least she's no longer having any hypoglycemic episodes. No insulin dosing changes today: U-500 125 BF, 125 lunch, 125 supper, no insulin at bedtime. Hba1c and lytes/cr today.  2) HTN: good control.  Cont toprol xl 136m bid.  3) NASH cirrhosis: she has portal hypertensive gastropathy. Monitor hepatic panel and platelets today. Hep B #2 today (engerix B).  Give #3 of the series (final) next f/u in 3 mo.  4) Hist of IDA: GI endo eval incl givens study all neg for site of bleeding. Suspected  due to iron malabs from chronic PPI +portal hypertensive gastroduodenopathy, plus some contribution from recurrent epistaxis. Epistaxis still an issue despite having nasal cautery x 2 by Dr. SWilburn Cornelia  5) PAF: sounds like she's in a-fib today.  Rate controlled and asymptomatic. Cont toprol xl 1046mbid and xarelto 20 mg qd. Lytes/cr today.  6) Preventative health: Vaccines: Hep B #2 due today->given.  Will give #3 at next visit in 3 mo (engerix-B).  Otherwise ALL UTD. Cervical ca screening: no further cervical ca screening is indicated (she is s/p hysterectomy for benign dx).  Has GYN MD. Breast ca screening: annual screening mammograms via her GYN, Dr. MaHale BogusOsteoporosis screening: osteopenia on DEXA 03/2019.  Rpt as per Dr. MiSabra HeckColon ca screening: most recent colonoscopy 08/2020->frequency of repeat colonoscopy n the context of this pt with UC and hx of GIB is to be determined by Dr. ReLaural Goldenher GI MD  An After Visit Summary was printed and given to the patient.  FOLLOW UP:  No follow-ups on file.  Signed:  PhCrissie SicklesMD           02/21/2021

## 2021-02-22 LAB — COMPREHENSIVE METABOLIC PANEL
ALT: 29 U/L (ref 0–35)
AST: 43 U/L — ABNORMAL HIGH (ref 0–37)
Albumin: 3.3 g/dL — ABNORMAL LOW (ref 3.5–5.2)
Alkaline Phosphatase: 73 U/L (ref 39–117)
BUN: 10 mg/dL (ref 6–23)
CO2: 30 mEq/L (ref 19–32)
Calcium: 8.3 mg/dL — ABNORMAL LOW (ref 8.4–10.5)
Chloride: 102 mEq/L (ref 96–112)
Creatinine, Ser: 0.67 mg/dL (ref 0.40–1.20)
GFR: 86.65 mL/min (ref >60.00)
Glucose, Bld: 180 mg/dL — ABNORMAL HIGH (ref 70–99)
Potassium: 5.2 mEq/L — ABNORMAL HIGH (ref 3.5–5.1)
Sodium: 138 mEq/L (ref 135–145)
Total Bilirubin: 0.6 mg/dL (ref 0.2–1.2)
Total Protein: 6.8 g/dL (ref 6.0–8.3)

## 2021-02-22 LAB — IRON,TIBC AND FERRITIN PANEL
%SAT: 10 % (calc) — ABNORMAL LOW (ref 16–45)
Ferritin: 9 ng/mL — ABNORMAL LOW (ref 16–288)
Iron: 39 ug/dL — ABNORMAL LOW (ref 45–160)
TIBC: 375 mcg/dL (calc) (ref 250–450)

## 2021-02-22 LAB — CBC
HCT: 33.6 % — ABNORMAL LOW (ref 36.0–46.0)
Hemoglobin: 10.6 g/dL — ABNORMAL LOW (ref 12.0–15.0)
MCHC: 31.6 g/dL (ref 30.0–36.0)
MCV: 89.3 fl (ref 78.0–100.0)
Platelets: 145 10*3/uL — ABNORMAL LOW (ref 150.0–400.0)
RBC: 3.77 Mil/uL — ABNORMAL LOW (ref 3.87–5.11)
RDW: 15.6 % — ABNORMAL HIGH (ref 11.5–15.5)
WBC: 5.7 10*3/uL (ref 4.0–10.5)

## 2021-02-22 LAB — HEMOGLOBIN A1C: Hgb A1c MFr Bld: 8.3 % — ABNORMAL HIGH (ref 4.6–6.5)

## 2021-02-23 ENCOUNTER — Other Ambulatory Visit: Payer: Self-pay | Admitting: Family Medicine

## 2021-02-23 ENCOUNTER — Other Ambulatory Visit: Payer: Self-pay

## 2021-02-23 ENCOUNTER — Telehealth: Payer: Self-pay

## 2021-02-23 DIAGNOSIS — D500 Iron deficiency anemia secondary to blood loss (chronic): Secondary | ICD-10-CM

## 2021-02-23 NOTE — Telephone Encounter (Signed)
Dr.Beekman's office closes early on Fridays, will follow up next week regarding order. Patient was made aware.   [1:55 PM] McGowen, Phillip Pls initiate the process of getting the IV iron done for [name] at Dr. Melissa Noon, order is Injectafer 717m IV x 1, repeat in 7 days.  [1:56 PM] McGowen, PDoren Custarddiagnosis is iron deficiency anemia due to chronic blood loss.  Intolerant of oral iron.-thx  02/23/2021 10:29 AM EDT Back to Top     Spoke with pt regarding rx and invokana is covered for 100 or 3085mdose. She did contact Dr.Beekman's office regarding iron infusion, she was told they only do certain iron infusions and would need to know which one prior to receiving. [facility], only 2 offered are Injectafer and saphnelo. PCP can provide order and Dr.Beekman can authorize it to be done. It was mentioned that she can not have iron infusion and remicade on the same day

## 2021-02-23 NOTE — Progress Notes (Signed)
A user error has taken place: encounter opened in error, closed for administrative reasons.

## 2021-02-27 ENCOUNTER — Telehealth: Payer: Self-pay

## 2021-02-27 ENCOUNTER — Other Ambulatory Visit: Payer: Self-pay

## 2021-02-27 DIAGNOSIS — D500 Iron deficiency anemia secondary to blood loss (chronic): Secondary | ICD-10-CM

## 2021-02-27 MED ORDER — INJECTAFER 750 MG/15ML IV SOLN: 750.0000 mg | Freq: Once | INTRAVENOUS | 1 refills | Status: DC

## 2021-02-27 MED ORDER — INJECTAFER 750 MG/15ML IV SOLN: 750.0000 mg | INTRAVENOUS | 1 refills | Status: DC

## 2021-02-27 NOTE — Telephone Encounter (Addendum)
Clarified with Margarita Grizzle how to enter order, IV iron is entered as rx and printed. Recent labs and OV notes printed as well and will be faxed to # provided, [phone]. Patient was made aware Dr.Beekman's office should be calling to schedule once everything received from our office.

## 2021-02-27 NOTE — Telephone Encounter (Signed)
LM for Margarita Grizzle to return call.

## 2021-02-27 NOTE — Addendum Note (Signed)
Addended by: Deveron Furlong D on: 02/27/2021 01:40 PM   Modules accepted: Orders

## 2021-02-27 NOTE — Telephone Encounter (Signed)
Please advise on rx recommendations. Iron infusion order sent to Dr.Beekman's office along with recent o/v notes and labs.  02/23/2021 10:29 AM EDT      Spoke with pt regarding rx and invokana is covered for 100 or 339m dose. She did contact Dr.Beekman's office regarding iron infusion, she was told they only do certain iron infusions and would need to know which one prior to receiving. [facility], only 2 offered are Injectafer and saphnelo. PCP can provide order and Dr.Beekman can authorize it to be done. It was mentioned that she can not have iron infusion and remicade on the same day

## 2021-02-27 NOTE — Telephone Encounter (Signed)
Margarita Grizzle called back Ph [phone] ext [phone]  She will need OV notes, recent labs, and order. Please call to back.

## 2021-02-28 ENCOUNTER — Other Ambulatory Visit: Payer: Self-pay

## 2021-02-28 MED ORDER — CANAGLIFLOZIN 100 MG PO TABS: 100.0000 mg | ORAL_TABLET | Freq: Every day | ORAL | 2 refills | Status: DC

## 2021-02-28 NOTE — Telephone Encounter (Signed)
Spoke with pt, rx sent to [facility]. She did inquire about iv iron infusion at Dr.Beekman's office, advised to wait until she hears from their office to schedule. I did not have any further information regarding this.

## 2021-02-28 NOTE — Telephone Encounter (Signed)
Pls eRx invokana 172m, 1 tab po qd, #30, RF x 2--thx

## 2021-02-28 NOTE — Telephone Encounter (Signed)
LM for pt to return call regarding rx. Provider only allowed for 90 day supply until her next appt in August

## 2021-03-20 DIAGNOSIS — D500 Iron deficiency anemia secondary to blood loss (chronic): Secondary | ICD-10-CM | POA: Diagnosis not present

## 2021-03-27 ENCOUNTER — Encounter (INDEPENDENT_AMBULATORY_CARE_PROVIDER_SITE_OTHER): Payer: Self-pay | Admitting: *Deleted

## 2021-03-27 DIAGNOSIS — D500 Iron deficiency anemia secondary to blood loss (chronic): Secondary | ICD-10-CM | POA: Diagnosis not present

## 2021-03-28 DIAGNOSIS — K5180 Other ulcerative colitis without complications: Secondary | ICD-10-CM | POA: Diagnosis not present

## 2021-04-10 ENCOUNTER — Other Ambulatory Visit: Payer: Self-pay

## 2021-04-10 ENCOUNTER — Ambulatory Visit (INDEPENDENT_AMBULATORY_CARE_PROVIDER_SITE_OTHER): Payer: Medicare PPO | Admitting: Internal Medicine

## 2021-04-10 ENCOUNTER — Encounter (INDEPENDENT_AMBULATORY_CARE_PROVIDER_SITE_OTHER): Payer: Self-pay | Admitting: Internal Medicine

## 2021-04-10 VITALS — BP 116/71 | HR 73 | Temp 98.1°F | Ht 62.0 in | Wt 231.0 lb

## 2021-04-10 DIAGNOSIS — R197 Diarrhea, unspecified: Secondary | ICD-10-CM | POA: Diagnosis not present

## 2021-04-10 DIAGNOSIS — K5180 Other ulcerative colitis without complications: Secondary | ICD-10-CM | POA: Diagnosis not present

## 2021-04-10 DIAGNOSIS — K219 Gastro-esophageal reflux disease without esophagitis: Secondary | ICD-10-CM | POA: Diagnosis not present

## 2021-04-10 DIAGNOSIS — K7460 Unspecified cirrhosis of liver: Secondary | ICD-10-CM

## 2021-04-10 DIAGNOSIS — K7581 Nonalcoholic steatohepatitis (NASH): Secondary | ICD-10-CM

## 2021-04-10 MED ORDER — FLINTSTONES COMPLETE 18 MG PO CHEW: 1.0000 | CHEWABLE_TABLET | Freq: Two times a day (BID) | ORAL | Status: DC

## 2021-04-10 MED ORDER — LOPERAMIDE HCL 2 MG PO CAPS: 2.0000 mg | ORAL_CAPSULE | Freq: Two times a day (BID) | ORAL | 5 refills | Status: DC | PRN

## 2021-04-10 NOTE — Progress Notes (Signed)
Presenting complaint;  Follow-up for ulcerative colitis, GERD iron deficiency anemia and diarrhea.  Database hand subjective:  Patient is 75 year old Caucasian female with multiple medical problems including history of pan ulcerative colitis who is up-to-date on surveillance colonoscopy(December 2021), history of iron deficiency anemia felt to be due to impaired iron absorption responding to parenteral iron.  She has been intolerant of ferrous sulfate.  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  She remains with diarrhea.  She has 3-4 bowel movements daily.  First stool is usually formed and subsequent stools are are pasty.  She tried dicyclomine.  She said it did not help and she is not sure if she had any side effects.  She is also tried on colestipol by Dr. Jenetta Downer and it resulted in lower abdominal pain and she stopped using this medication.  She does not have nocturnal bowel movement.  She continues to have accident almost every day.  She denies melena or rectal bleeding.  She has lost 7 pounds since her last visit.  She states that she has stopped drinking colas.  She had blood work by Dr. Amil Amen and her hemoglobin had dropped.  She received 2 doses of iron infusion.  1 in May and second 1 in first week of June 2022. She does not take Tylenol daily.  She feels Nexium 20 mg daily is working. She states she has decreased insulin dose at mealtime because she feels she has been having hypoglycemic spells.  She has not discussed this with her physician. Patient states that she doubts that she will be able to have bariatric surgery.  She was in the process evaluation and it got interrupted because of COVID.  Now her insurance has changed and she doubts that it would cover.  Current Medications: Outpatient Encounter Medications as of 04/10/2021  Medication Sig   ACCU-CHEK  GUIDE test strip USE TO CHECK BLOOD GLUCOSE UP TO 6 TIMES DAILY AS DIRECTED   Accu-Chek Softclix Lancets lancets USE TO CHECK BLOOD GLUCOSE UP TO 6 TIMES DAILY AS DIRECTED   acetaminophen (TYLENOL) 325 MG tablet Take 650 mg by mouth every 6 (six) hours as needed for moderate pain or headache.   B-D UF III MINI PEN NEEDLES 31G X 5 MM MISC USE TO INJECT INSULINS EQUAL TO 6 TIMES DAILY.   blood glucose meter kit and supplies KIT Use up to six times daily as directed. DX. E11.9   canagliflozin (INVOKANA) 100 MG TABS tablet Take 1 tablet (100 mg total) by mouth daily before breakfast.   citalopram (CELEXA) 20 MG tablet TAKE 1 TABLET BY MOUTH EVERY DAY   Cyanocobalamin (B-12) 5000 MCG CAPS Take 5,000 mcg by mouth daily.   Dulaglutide (TRULICITY) 1.5 YW/7.3XT SOPN INJECT 1.5MG ONCE WEEKLY   esomeprazole (NEXIUM) 20 MG capsule Take 20 mg by mouth daily.    furosemide (LASIX) 40 MG tablet TAKE 1 TABLET BY MOUTH EVERY DAY   gabapentin (NEURONTIN) 600 MG tablet TAKE 1 AND 1/2 TABLETS BY MOUTH TWICE A DAY   inFLIXimab (REMICADE) 100 MG injection Inject 100 mg into the vein every 8 (eight) weeks.    insulin regular human CONCENTRATED (HUMULIN R U-500 KWIKPEN) 500 UNIT/ML kwikpen Inject 125-135 Units into the skin See admin instructions. Inject 125 units with breakfast, 135 units with lunch, and 125 units with supper.   KLOR-CON M10 10 MEQ tablet TAKE 1  TABLET BY MOUTH EVERY DAY   lactase (LACTAID) 3000 UNITS tablet Take 3,000 Units by mouth as needed (when eating foods containing dairy).    meclizine (ANTIVERT) 25 MG tablet Take 1 tablet (25 mg total) by mouth 3 (three) times daily as needed for dizziness or nausea.   metoprolol succinate (TOPROL-XL) 100 MG 24 hr tablet TAKE 1 TABLET BY MOUTH 2 TIMES DAILY. TAKE WITH OR IMMEDIATELY FOLLOWING A MEAL   Multiple Vitamin (MULTIVITAMIN WITH MINERALS) TABS tablet Take 1 tablet by mouth daily.   Polyvinyl Alcohol-Povidone (REFRESH OP) Place 1 drop into both eyes  daily as needed (dry eyes).   XARELTO 20 MG TABS tablet TAKE 1 TABLET BY MOUTH DAILY WITH SUPPER.   ferric carboxymaltose (INJECTAFER) 750 MG/15ML SOLN injection Inject 15 mLs (750 mg total) into the vein as directed for 1 dose. Repeat in 7 days (Patient not taking: Reported on 04/10/2021)   Probiotic Product (ALIGN PO) Take 1 tablet by mouth daily. (Patient not taking: Reported on 04/10/2021)   [DISCONTINUED] amitriptyline (ELAVIL) 25 MG tablet Take 1 tablet (25 mg total) by mouth at bedtime. start with 1/2 tab at night for 1 week then increase to 1 whole tab.   [DISCONTINUED] bromocriptine (PARLODEL) 2.5 MG tablet Take 2.5 mg by mouth 2 (two) times daily.   [DISCONTINUED] potassium chloride (K-DUR) 10 MEQ tablet Take 2 tablets (20 mEq total) by mouth daily.   No facility-administered encounter medications on file as of 04/10/2021.     Objective: Blood pressure 116/71, pulse 73, temperature 98.1 F (36.7 C), temperature source Oral, height 5' 2"  (1.575 m), weight 231 lb (104.8 kg). Patient is alert and in no acute distress. She is wearing a mask. She does not have asterixis. Conjunctiva is pink. Sclera is nonicteric Oropharyngeal mucosa is normal. No neck masses or thyromegaly noted. Cardiac exam with regular rhythm normal S1 and S2. No murmur or gallop noted. Lungs are clear to auscultation. Abdomen is full but soft and nontender without organomegaly or masses. She has both pitting and nonpitting edema.  Edema to her legs appear to be predominantly nonpitting(?  Lymphedema).  Labs/studies Results:   CBC Latest Ref Rng & Units 02/21/2021 10/11/2020 08/18/2020  WBC 4.0 - 10.5 K/uL 5.7 4.6 4.1  Hemoglobin 12.0 - 15.0 g/dL 10.6(L) 13.3 12.5  Hematocrit 36.0 - 46.0 % 33.6(L) 38 36.6  Platelets 150.0 - 400.0 K/uL 145.0(L) 90(A) 92.0(L)    CMP Latest Ref Rng & Units 02/21/2021 10/11/2020 09/20/2020  Glucose 70 - 99 mg/dL 180(H) - -  BUN 6 - 23 mg/dL 10 11 -  Creatinine 0.40 - 1.20 mg/dL  0.67 0.8 0.60  Sodium 135 - 145 mEq/L 138 144 -  Potassium 3.5 - 5.1 mEq/L 5.2 No hemolysis seen(H) 4.3 -  Chloride 96 - 112 mEq/L 102 106 -  CO2 19 - 32 mEq/L 30 18 -  Calcium 8.4 - 10.5 mg/dL 8.3(L) 9.2 -  Total Protein 6.0 - 8.3 g/dL 6.8 - -  Total Bilirubin 0.2 - 1.2 mg/dL 0.6 - -  Alkaline Phos 39 - 117 U/L 73 80 -  AST 0 - 37 U/L 43(H) 48(A) -  ALT 0 - 35 U/L 29 44(A) -    Hepatic Function Latest Ref Rng & Units 02/21/2021 10/11/2020 08/18/2020  Total Protein 6.0 - 8.3 g/dL 6.8 - 6.4  Albumin 3.5 - 5.2 g/dL 3.3(L) 3.9 3.2(L)  AST 0 - 37 U/L 43(H) 48(A) 46(H)  ALT 0 - 35 U/L 29 44(A) 38(H)  Alk Phosphatase 39 - 117 U/L 73 80 73  Total Bilirubin 0.2 - 1.2 mg/dL 0.6 - 0.9  Bilirubin, Direct 0.0 - 0.3 mg/dL - - -    Lab Results  Component Value Date   CRP <0.5 03/04/2014      Assessment:  #1.  Chronic ulcerative colitis.  She remains in endoscopic remission as documented on colonoscopy of December 2021.  She will continue infliximab as long that is working.  #2.  Iron deficiency anemia.  Iron deficiency anemia most likely due to impaired iron absorption.  She has been intolerant of ferrous sulfate.  She may try low-dose iron and if she is able to tolerate it it may obviate need for parenteral iron.  #3.  GERD.  She is doing well with low-dose PPI.  At one point she was on 80 mg of esomeprazole daily.  Hopefully she can absorb iron while she is on low-dose PPI.  #4.  Diarrhea.  Diarrhea is not due to ulcerative colitis which has been documented to be in remission.  Diarrhea is either due to IBS or diabetic diarrhea.  She did not tolerate dicyclomine and colestipol resulted in lower abdominal pain.  We will try her on scheduled loperamide dose and see how she does.  #5.  Cirrhosis secondary to NASH.  She has compensated disease.  ALT is now normal and AST is trending down.  She is due for [facility] screening.  She was being evaluated for bariatric surgery but this never happened.  Weight  loss of 7 pounds in the last 6 months is reassuring   Plan:  [place] chewable with iron 1 tablet p.o. twice daily. Abdominal ultrasound. Patient advised to take loperamide OTC 2 mg daily before breakfast and second dose when she has to leave house. She will keep a stool diary and call with progress report in 8 weeks. Office visit in 6 months.

## 2021-04-10 NOTE — Patient Instructions (Addendum)
Physician will call with results about abdominal ultrasound when completed. Taking Imodium/loperamide OTC 2 mg daily before breakfast and use second dose on as-needed basis i.e. when you travel or leave home. Please call office with progress report in 8 weeks or so regarding stool frequency.

## 2021-04-12 ENCOUNTER — Telehealth: Payer: Self-pay

## 2021-04-13 NOTE — Telephone Encounter (Signed)
Pt aware of denied PA

## 2021-04-18 ENCOUNTER — Ambulatory Visit (HOSPITAL_COMMUNITY): Payer: Medicare PPO

## 2021-04-20 ENCOUNTER — Other Ambulatory Visit: Payer: Self-pay

## 2021-04-20 ENCOUNTER — Ambulatory Visit (HOSPITAL_COMMUNITY)
Admission: RE | Admit: 2021-04-20 | Discharge: 2021-04-20 | Disposition: A | Discharge status: Home / Self Care | Payer: Medicare PPO | Source: Ambulatory Visit | Attending: Internal Medicine | Admitting: Internal Medicine

## 2021-04-20 ENCOUNTER — Other Ambulatory Visit: Payer: Self-pay | Admitting: Family Medicine

## 2021-04-20 DIAGNOSIS — K7581 Nonalcoholic steatohepatitis (NASH): Secondary | ICD-10-CM | POA: Insufficient documentation

## 2021-04-20 DIAGNOSIS — K7460 Unspecified cirrhosis of liver: Secondary | ICD-10-CM | POA: Insufficient documentation

## 2021-04-20 DIAGNOSIS — R161 Splenomegaly, not elsewhere classified: Secondary | ICD-10-CM | POA: Diagnosis not present

## 2021-04-20 DIAGNOSIS — K7689 Other specified diseases of liver: Secondary | ICD-10-CM | POA: Diagnosis not present

## 2021-04-25 ENCOUNTER — Other Ambulatory Visit (INDEPENDENT_AMBULATORY_CARE_PROVIDER_SITE_OTHER): Payer: Self-pay | Admitting: Internal Medicine

## 2021-04-25 DIAGNOSIS — K7581 Nonalcoholic steatohepatitis (NASH): Secondary | ICD-10-CM

## 2021-04-25 DIAGNOSIS — K7460 Unspecified cirrhosis of liver: Secondary | ICD-10-CM

## 2021-05-01 ENCOUNTER — Telehealth: Payer: Self-pay | Admitting: Family Medicine

## 2021-05-01 DIAGNOSIS — G4733 Obstructive sleep apnea (adult) (pediatric): Secondary | ICD-10-CM

## 2021-05-01 NOTE — Telephone Encounter (Signed)
Pt has upcoming appt on 8/24, last referral was 10/09/20 by Wyn Quaker, NP for CPAP supplies & equipment. Dx used was OSA(G47.33)   Please advise, thanks.

## 2021-05-01 NOTE — Telephone Encounter (Signed)
Patient requesting referral to Dr. Jerrell Belfast for the [facility] CPAP replacement.

## 2021-05-02 NOTE — Telephone Encounter (Signed)
Pt made aware about referral

## 2021-05-02 NOTE — Telephone Encounter (Signed)
LM for pt to return call regarding referral.

## 2021-05-02 NOTE — Telephone Encounter (Signed)
OK, referral ordered

## 2021-05-15 ENCOUNTER — Other Ambulatory Visit (INDEPENDENT_AMBULATORY_CARE_PROVIDER_SITE_OTHER): Payer: Self-pay | Admitting: *Deleted

## 2021-05-15 DIAGNOSIS — K7460 Unspecified cirrhosis of liver: Secondary | ICD-10-CM | POA: Diagnosis not present

## 2021-05-15 DIAGNOSIS — K7581 Nonalcoholic steatohepatitis (NASH): Secondary | ICD-10-CM | POA: Diagnosis not present

## 2021-05-15 DIAGNOSIS — K5100 Ulcerative (chronic) pancolitis without complications: Secondary | ICD-10-CM

## 2021-05-16 LAB — AFP TUMOR MARKER: AFP-Tumor Marker: 4.8 ng/mL

## 2021-05-17 ENCOUNTER — Other Ambulatory Visit (INDEPENDENT_AMBULATORY_CARE_PROVIDER_SITE_OTHER): Payer: Self-pay | Admitting: Internal Medicine

## 2021-05-17 DIAGNOSIS — K7581 Nonalcoholic steatohepatitis (NASH): Secondary | ICD-10-CM

## 2021-05-17 DIAGNOSIS — K7460 Unspecified cirrhosis of liver: Secondary | ICD-10-CM

## 2021-05-19 ENCOUNTER — Other Ambulatory Visit: Payer: Self-pay | Admitting: Family Medicine

## 2021-05-21 ENCOUNTER — Other Ambulatory Visit (INDEPENDENT_AMBULATORY_CARE_PROVIDER_SITE_OTHER): Payer: Self-pay | Admitting: Internal Medicine

## 2021-05-21 DIAGNOSIS — K5100 Ulcerative (chronic) pancolitis without complications: Secondary | ICD-10-CM | POA: Diagnosis not present

## 2021-05-23 ENCOUNTER — Other Ambulatory Visit: Payer: Self-pay | Admitting: Family Medicine

## 2021-05-23 ENCOUNTER — Ambulatory Visit: Payer: Medicare PPO | Admitting: Family Medicine

## 2021-05-23 ENCOUNTER — Encounter: Payer: Self-pay | Admitting: Family Medicine

## 2021-05-23 ENCOUNTER — Other Ambulatory Visit: Payer: Self-pay

## 2021-05-23 VITALS — BP 118/72 | HR 72 | Temp 98.5°F | Wt 233.6 lb

## 2021-05-23 DIAGNOSIS — I10 Essential (primary) hypertension: Secondary | ICD-10-CM

## 2021-05-23 DIAGNOSIS — I480 Paroxysmal atrial fibrillation: Secondary | ICD-10-CM | POA: Diagnosis not present

## 2021-05-23 DIAGNOSIS — K7460 Unspecified cirrhosis of liver: Secondary | ICD-10-CM

## 2021-05-23 DIAGNOSIS — E1140 Type 2 diabetes mellitus with diabetic neuropathy, unspecified: Secondary | ICD-10-CM

## 2021-05-23 DIAGNOSIS — Z7901 Long term (current) use of anticoagulants: Secondary | ICD-10-CM

## 2021-05-23 DIAGNOSIS — Z23 Encounter for immunization: Secondary | ICD-10-CM | POA: Diagnosis not present

## 2021-05-23 DIAGNOSIS — K7581 Nonalcoholic steatohepatitis (NASH): Secondary | ICD-10-CM | POA: Diagnosis not present

## 2021-05-23 DIAGNOSIS — D508 Other iron deficiency anemias: Secondary | ICD-10-CM | POA: Diagnosis not present

## 2021-05-23 DIAGNOSIS — Z794 Long term (current) use of insulin: Secondary | ICD-10-CM

## 2021-05-23 MED ORDER — CONTINUOUS GLUCOSE MONITOR SUP MISC
0 refills | Status: DC
Start: 2021-05-23 — End: 2021-08-16

## 2021-05-23 MED ORDER — CONTINUOUS BLOOD GLUC SENSOR MISC: 1.0000 | 1 refills | Status: DC

## 2021-05-23 MED ORDER — CANAGLIFLOZIN 100 MG PO TABS: 100.0000 mg | ORAL_TABLET | Freq: Every day | ORAL | 3 refills | Status: DC

## 2021-05-23 MED ORDER — POTASSIUM CHLORIDE CRYS ER 10 MEQ PO TBCR: 10.0000 meq | EXTENDED_RELEASE_TABLET | Freq: Every day | ORAL | 3 refills | Status: DC

## 2021-05-23 NOTE — Progress Notes (Signed)
OFFICE VISIT  05/23/2021  CC:  Chief Complaint  Patient presents with   Follow-up    3 month   HPI:    Patient is a 75 y.o. Caucasian female who presents for 3 mo f/u DM 2 with complication of DPN, HTN, cirrhosis d/t NASH, PAF, and iron def anemia d/t malabsorption. A/P as of last visit: "1) DM2; glucoses erratic as per her usual but at least she's no longer having any hypoglycemic episodes. No insulin dosing changes today: U-500 125 BF, 125 lunch, 125 supper, no insulin at bedtime. Hba1c and lytes/cr today.   2) HTN: good control.  Cont toprol xl 159m bid.   3) NASH cirrhosis: she has portal hypertensive gastropathy. Monitor hepatic panel and platelets today. Hep B #2 today (engerix B).  Give #3 of the series (final) next f/u in 3 mo.   4) Hist of IDA: GI endo eval incl givens study all neg for site of bleeding. Suspected due to iron malabs from chronic PPI +portal hypertensive gastroduodenopathy, plus some contribution from recurrent epistaxis.  Epistaxis still an issue despite having nasal cautery x 2 by Dr. SWilburn Cornelia   5) PAF: sounds like she's in a-fib today.  Rate controlled and asymptomatic. Cont toprol xl 1042mbid and xarelto 20 mg qd. Lytes/cr today.   6) Preventative health: Vaccines: Hep B #2 due today->given.  Will give #3 at next visit in 3 mo (engerix-B).  Otherwise ALL UTD. Cervical ca screening: no further cervical ca screening is indicated (she is s/p hysterectomy for benign dx).  Has GYN MD. Breast ca screening: annual screening mammograms via her GYN, Dr. MaHale BogusOsteoporosis screening: osteopenia on DEXA 03/2019.  Rpt as per Dr. MiSabra HeckColon ca screening: most recent colonoscopy 08/2020->frequency of repeat colonoscopy n the context of this pt with UC and hx of GIB is to be determined by Dr. ReLaural Goldenher GI MD"  INTERIM HX: Says invokana helping. Has decreased her insulin to 110 U for at least the last 6 wks. Glucoses still highly erratic, with a  fair amount of lows and lots of 200s and some 300s. She sometimes gives an hs dose of insulin.  As usual, there is no clear pattern of low or high gluc.  Has been taking her flintstone MVI with iron. Nexium 20 mg qd.  Dr. ReLaural Goldenid liver u/s for ca screening 04/20/21 and it was stable, rpt 1 yr planned.  HTN: home bp's consistently normal.  PAF: no palpitations or SOB or dizziness.  She still has daily nosebleeds.   ROS as above, plus--> no fevers, no CP, no SOB, no wheezing, no cough, no dizziness, no HAs, no rashes, no melena/hematochezia.  No polyuria or polydipsia.  No myalgias or arthralgias.  No focal weakness, paresthesias, or tremors.  No acute vision or hearing abnormalities.  No dysuria or unusual/new urinary urgency or frequency.  No recent changes in lower legs. No n/v/d or abd pain.  No palpitations.     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 ? to get liver bx if she gets bariatric surgery? Mild portal hypertensive gastropathy on EGD 08/2019.   Fibromyalgia    GAD (generalized anxiety disorder)    GERD    History of iron deficiency anemia 12/2018   Possibly inadequate absorption secondary to chronic/long term PPI therapy + portal hypertensive gastroduodenopathy. No  GIB found on EGD, colonosc, and givens. Oral iron restarted 12/2019--unclear compliance.   History of thrombocytopenia 12/2011   Hyperlipidemia    Intolerant of statins   HYPERTENSION    IBS (irritable bowel syndrome)    -D.  Good response to bentyl and imodium as of 06/2018 GI f/u.   IDDM (insulin dependent diabetes mellitus)    with DPN (managed by Dr. Cruzita Lederer but then in 2018 pt preferred to have me manage for her convenience)   Limited mobility    Requires a walker for arthritic pain, widespread musculoskeletal pain, and neuropathic pain.   Morbid  obesity ([place])    As of 11/2018, pt considering sleeve gastectomy vs bipass as of eval by Dr. De Burrs considering as of 12/2019.   Nonalcoholic steatohepatitis    Viral Hep screens NEG.  CT 2015.  Transaminasemia.  U/S 07/2018 showed early changes of cirrhosis.   OSA (obstructive sleep apnea) 09/14/2015   sleep study 09/07/15: severe obstructive sleep apnea with an AHI of 72 and SaO2 low of 75%.>refer to sleep med for eval and tx options   Osteoarthritis    hips, shoulders, knees   PAF (paroxysmal atrial fibrillation) ([place])    One documented episode (after getting EGD 2016).  Was on amiodarone x 3 mo.  Rate control with metoprolol + anticoag with xarelto.    Recurrent epistaxis    Granuloma in L nare cauterized by ENT 04/2020. Another cautery 06/2020   Small fiber neuropathy    Due to DM.  Symmetric hands and feet tingling/numbness.   Ulcerative colitis ([place])    Remicade infusion Q 8 weeks: in clinical and endoscopic remission as of 12/2018 GI f/u.  06/11/19 rpt colonoscopy->cecal and ascending colon colitis.    Past Surgical History:  Procedure Laterality Date   ABDOMINAL HYSTERECTOMY  1980   Paps no longer indicated.   BACTERIAL OVERGROWTH TEST N/A 07/13/2015   Procedure: BACTERIAL OVERGROWTH TEST;  Surgeon: Rogene Houston, MD;  [facility];  Service: Endoscopy;  Laterality: N/A;  730     BILATERAL SALPINGOOPHORECTOMY  02/10/2001   BIOPSY  06/11/2019   Procedure: BIOPSY;  Surgeon: Rogene Houston, MD;  [facility];  Service: Endoscopy;;  colon   BIOPSY  09/27/2019   Procedure: BIOPSY;  Surgeon: Rogene Houston, MD;  [facility];  Service: Endoscopy;;  gastric duodenal   BIOPSY  09/15/2020   Procedure: BIOPSY;  Surgeon: Harvel Quale, MD;  [facility];  Service: Gastroenterology;;   BREAST REDUCTION SURGERY  1994   bilat   CARDIOVASCULAR STRESS TEST  07/2010   Lexiscan myoview: normal   CARPAL TUNNEL RELEASE Right 1996    CARPAL TUNNEL RELEASE Left 03/21/2003   CARPAL TUNNEL RELEASE Right 05/04/2013   Procedure: CARPAL TUNNEL RELEASE;  Surgeon: Cammie Sickle., MD;  Location: [facility];  Service: Orthopedics;  Laterality: Right;   CARPAL TUNNEL RELEASE Left 09/21/2013   Procedure: LEFT CARPAL TUNNEL RELEASE;  Surgeon: Cammie Sickle., MD;  Location: [facility];  Service: Orthopedics;  Laterality: Left;   CHOLECYSTECTOMY     COLONOSCOPY WITH PROPOFOL N/A 08/04/2015   Colitis in remission.  No polyps.  Procedure: COLONOSCOPY WITH PROPOFOL;  Surgeon: Rogene Houston, MD;  [facility];  Service: Endoscopy;  Laterality: N/A;  cecum time in  0820   time out  0827    total time 7 minutes   COLONOSCOPY WITH PROPOFOL N/A  06/11/2019   cecal and ascending colon colitis.  Procedure: COLONOSCOPY WITH PROPOFOL;  Surgeon: Rogene Houston, MD;  [facility];  Service: Endoscopy;  Laterality: N/A;  730a   COLONOSCOPY WITH PROPOFOL N/A 09/15/2020   Procedure: COLONOSCOPY WITH PROPOFOL;  Surgeon: Harvel Quale, MD;  [facility];  Service: Gastroenterology;  Laterality: N/A;  1030   ESOPHAGEAL DILATION N/A 08/04/2015   Procedure: ESOPHAGEAL DILATION;  Surgeon: Rogene Houston, MD;  [facility];  Service: Endoscopy;  Laterality: N/AKelvin Cellar, no mucousal disruption   ESOPHAGOGASTRODUODENOSCOPY  09/27/2019   Performed for IDA.  Esoph dilation was done but no stricture present.  Mild portal hypertensive gastropathy, o/w normal.  Duodenal bx NEG.  h pylori neg.   ESOPHAGOGASTRODUODENOSCOPY (EGD) WITH ESOPHAGEAL DILATION  12/02/2005   ESOPHAGOGASTRODUODENOSCOPY (EGD) WITH PROPOFOL N/A 08/04/2015   Procedure: ESOPHAGOGASTRODUODENOSCOPY (EGD) WITH PROPOFOL;  Surgeon: Rogene Houston, MD;  [facility];  Service: Endoscopy;  Laterality: N/A;  procedure 1   ESOPHAGOGASTRODUODENOSCOPY (EGD) WITH PROPOFOL N/A 09/27/2019   Procedure: ESOPHAGOGASTRODUODENOSCOPY  (EGD) WITH PROPOFOL;  Surgeon: Rogene Houston, MD;  [facility];  Service: Endoscopy;  Laterality: N/A;  12:10   FLEXIBLE SIGMOIDOSCOPY  01/17/2012   Procedure: FLEXIBLE SIGMOIDOSCOPY;  Surgeon: Rogene Houston, MD;  [facility];  Service: Endoscopy;  Laterality: N/A;   GIVENS CAPSULE STUDY N/A 08/03/2019   Procedure: GIVENS CAPSULE STUDY (performed for IDA)->some food debris in stomach and small amount of blood.  Surgeon: Rogene Houston, MD;  [facility];  Service: Endoscopy;  Laterality: N/A;  730AM   HEMILAMINOTOMY LUMBAR SPINE Bilateral 09/07/1999   L4-5   KNEE ARTHROSCOPY Right 01/1999; 10/2000   LYSIS OF ADHESION  02/10/2001   MALONEY DILATION  09/27/2019   Procedure: MALONEY DILATION;  Surgeon: Rogene Houston, MD;  [facility];  Service: Endoscopy;;   [place]; 09/12/2006   TARSAL TUNNEL RELEASE  2002   TRANSTHORACIC ECHOCARDIOGRAM  08/04/2015   EF 60-65%, normal wall motion, mild LVH, mild LA dilation, grd I DD.   TUMOR EXCISION Left 03/21/2003   dorsal 1st web space (hand)   URETEROLYSIS Right 02/10/2001    Outpatient Medications Prior to Visit  Medication Sig Dispense Refill   ACCU-CHEK GUIDE test strip USE TO CHECK BLOOD GLUCOSE UP TO 6 TIMES DAILY AS DIRECTED 200 strip 1   Accu-Chek Softclix Lancets lancets USE TO CHECK BLOOD GLUCOSE UP TO 6 TIMES DAILY AS DIRECTED 200 each 1   acetaminophen (TYLENOL) 325 MG tablet Take 650 mg by mouth every 6 (six) hours as needed for moderate pain or headache.     B-D UF III MINI PEN NEEDLES 31G X 5 MM MISC USE TO INJECT INSULINS EQUAL TO 6 TIMES DAILY. 600 each 5   blood glucose meter kit and supplies KIT Use up to six times daily as directed. DX. E11.9 1 each 0   citalopram (CELEXA) 20 MG tablet TAKE 1 TABLET BY MOUTH EVERY DAY 90 tablet 3   Cyanocobalamin (B-12) 5000 MCG CAPS Take 5,000 mcg by mouth daily.     Dulaglutide (TRULICITY) 1.5 OZ/3.0QM SOPN INJECT 1.5MG ONCE WEEKLY 18  mL 2   esomeprazole (NEXIUM) 20 MG capsule Take 20 mg by mouth daily.      ferric carboxymaltose (INJECTAFER) 750 MG/15ML SOLN injection Inject 15 mLs (750 mg total) into the vein as directed for 1 dose. Repeat in 7 days  15 mL 1   furosemide (LASIX) 40 MG tablet TAKE 1 TABLET BY MOUTH EVERY DAY 90 tablet 3   gabapentin (NEURONTIN) 600 MG tablet TAKE 1 AND 1/2 TABLETS BY MOUTH TWICE A DAY 270 tablet 3   inFLIXimab (REMICADE) 100 MG injection Inject 100 mg into the vein every 8 (eight) weeks.      insulin regular human CONCENTRATED (HUMULIN R U-500 KWIKPEN) 500 UNIT/ML kwikpen Inject 125-135 Units into the skin See admin instructions. Inject 125 units with breakfast, 135 units with lunch, and 125 units with supper. 45 mL 1   lactase (LACTAID) 3000 UNITS tablet Take 3,000 Units by mouth as needed (when eating foods containing dairy).      loperamide (IMODIUM) 2 MG capsule Take 1 capsule (2 mg total) by mouth 2 (two) times daily as needed for diarrhea or loose stools. 90 capsule 5   meclizine (ANTIVERT) 25 MG tablet Take 1 tablet (25 mg total) by mouth 3 (three) times daily as needed for dizziness or nausea. 90 tablet 0   metoprolol succinate (TOPROL-XL) 100 MG 24 hr tablet TAKE 1 TABLET BY MOUTH 2 TIMES DAILY. TAKE WITH OR IMMEDIATELY FOLLOWING A MEAL 180 tablet 3   Multiple Vitamin (MULTIVITAMIN WITH MINERALS) TABS tablet Take 1 tablet by mouth daily.     Pediatric Multivitamins-Iron (FLINTSTONES COMPLETE) 18 MG CHEW Chew 1 tablet by mouth 2 (two) times daily.     Polyvinyl Alcohol-Povidone (REFRESH OP) Place 1 drop into both eyes daily as needed (dry eyes).     XARELTO 20 MG TABS tablet TAKE 1 TABLET BY MOUTH DAILY WITH SUPPER. 90 tablet 1   INVOKANA 100 MG TABS tablet TAKE 1 TABLET BY MOUTH DAILY BEFORE BREAKFAST. 30 tablet 0   KLOR-CON M10 10 MEQ tablet TAKE 1 TABLET BY MOUTH EVERY DAY 90 tablet 3   No facility-administered medications prior to visit.    Allergies  Allergen Reactions    Omeprazole Anaphylaxis and Swelling    SWELLING OF TONGUE AND THROAT   Actos [Pioglitazone] Other (See Comments)    Weight gain   Benzocaine-Menthol Swelling    SWELLING OF MOUTH   Colesevelam Other (See Comments)    GI UPSET   Flagyl [Metronidazole Hcl] Other (See Comments)    DIAPHORESIS   Metformin And Related Diarrhea   Shrimp [Shellfish Allergy] Itching    OF THROAT AND EARS   Statins Palpitations   Desipramine Hcl Itching, Nausea Only and Other (See Comments)    "swimmy" headed, ears itched    Hydromorphone Itching   Jardiance [Empagliflozin] Other (See Comments)    weakness   Lactose Intolerance (Gi) Diarrhea    Gas, bloating   Adhesive [Tape] Other (See Comments)    SKIN IRRITATION AND BRUISING   Nisoldipine Itching   Percocet [Oxycodone-Acetaminophen] Itching    ROS As per HPI  PE: Vitals with BMI 05/23/2021 04/10/2021 02/21/2021  Height - 5' 2"  5' 2"   Weight 233 lbs 10 oz 231 lbs 238 lbs  BMI - 95.28 41.32  Systolic 440 102 725  Diastolic 72 71 72  Pulse 72 73 92    Gen: Alert, well appearing.  Patient is oriented to person, place, time, and situation. AFFECT: pleasant, lucid thought and speech. Foot exam - no pitting edema.  Fibrotic changes to skin, redundant soft tissue--impossible to adequately palpate pulses.  Feet pink, warm.  No tenderness or skin or vascular lesions.  Sensation with monofilament testing is absent bila. Toenails are thickened/mycotic.   LABS:  Lab Results  Component Value Date   TSH 1.09 08/18/2020   Lab Results  Component Value Date   WBC 5.7 02/21/2021   HGB 10.6 (L) 02/21/2021   HCT 33.6 (L) 02/21/2021   MCV 89.3 02/21/2021   PLT 145.0 (L) 02/21/2021   Lab Results  Component Value Date   IRON 39 (L) 02/21/2021   TIBC 375 02/21/2021   FERRITIN 9 (L) 02/21/2021   Lab Results  Component Value Date   CREATININE 0.67 02/21/2021   BUN 10 02/21/2021   NA 138 02/21/2021   K 5.2 No hemolysis seen (H) 02/21/2021   CL 102  02/21/2021   CO2 30 02/21/2021   Lab Results  Component Value Date   ALT 29 02/21/2021   AST 43 (H) 02/21/2021   ALKPHOS 73 02/21/2021   BILITOT 0.6 02/21/2021   Lab Results  Component Value Date   CHOL 202 (H) 04/18/2020   Lab Results  Component Value Date   HDL 41.50 04/18/2020   Lab Results  Component Value Date   LDLCALC 139 (H) 04/18/2020   Lab Results  Component Value Date   TRIG 108.0 04/18/2020   Lab Results  Component Value Date   CHOLHDL 5 04/18/2020   Lab Results  Component Value Date   HGBA1C 8.3 (H) 02/21/2021   IMPRESSION AND PLAN:  1) DM 2, with DPN: Glucoses very erratic as per her usual, no clear pattern. Signif improvement overall since starting invokana 100 qd so we'll continue this. She has been able to dec U-500 to 110 U qAC.  She has to be careful with adding a bedtime dose. Hba1c today. Feet exam today confirmed once again dense loss of sensation both feet.  2) HTN; well controlled on toprol xl 100 bid. Lytes/cr today.  3) NASH cirrhosis.  Followed by Dr. Laural [name]. Hepatic panel normal 01/2021, repeating today. Most recent liver u/s last month stable. Hep B (engerix) #3 today.  4) IDA: malabsorption (PPI long term + portal hypertensive gastropathy) + chronic blood loss d/t recurrent epistaxis. Complicated by xarelto for her stroke prophylaxis.  She does use saline nasal spray hs and humidified air in CPAP. She will call for f/u appt with her ENT, Dr. Wilburn Cornelia. She did get iron infusion at Dr. Melissa Noon office about 2-3 mo ago. She takes a flintstone MVI. CBC and iron panel today.  5) PAF: asymptomatic.  She is on rate control with toprol xl 100 bid and anticoag with xarelto 10 qd. Lytes/cr and cbc today.  An After Visit Summary was printed and given to the patient.  FOLLOW UP: Return in about 3 months (around 08/23/2021) for routine chronic illness f/u. Next cpe 01/2022  Signed:  Crissie Sickles, MD           05/23/2021

## 2021-05-23 NOTE — Telephone Encounter (Signed)
Notified pt and advised her to call insurance to see if any other options are available.

## 2021-05-24 LAB — CBC
HCT: 37.7 % (ref 36.0–46.0)
Hemoglobin: 12.2 g/dL (ref 12.0–15.0)
MCHC: 32.5 g/dL (ref 30.0–36.0)
MCV: 97.8 fl (ref 78.0–100.0)
Platelets: 103 10*3/uL — ABNORMAL LOW (ref 150.0–400.0)
RBC: 3.85 Mil/uL — ABNORMAL LOW (ref 3.87–5.11)
RDW: 19.1 % — ABNORMAL HIGH (ref 11.5–15.5)
WBC: 3.4 10*3/uL — ABNORMAL LOW (ref 4.0–10.5)

## 2021-05-24 LAB — COMPREHENSIVE METABOLIC PANEL
ALT: 33 U/L (ref 0–35)
AST: 51 U/L — ABNORMAL HIGH (ref 0–37)
Albumin: 3.3 g/dL — ABNORMAL LOW (ref 3.5–5.2)
Alkaline Phosphatase: 94 U/L (ref 39–117)
BUN: 12 mg/dL (ref 6–23)
CO2: 28 mEq/L (ref 19–32)
Calcium: 8.8 mg/dL (ref 8.4–10.5)
Chloride: 101 mEq/L (ref 96–112)
Creatinine, Ser: 0.75 mg/dL (ref 0.40–1.20)
GFR: 78.79 mL/min (ref >60.00)
Glucose, Bld: 327 mg/dL — ABNORMAL HIGH (ref 70–99)
Potassium: 4 mEq/L (ref 3.5–5.1)
Sodium: 137 mEq/L (ref 135–145)
Total Bilirubin: 0.7 mg/dL (ref 0.2–1.2)
Total Protein: 6.9 g/dL (ref 6.0–8.3)

## 2021-05-24 LAB — IRON,TIBC AND FERRITIN PANEL
%SAT: 29 % (calc) (ref 16–45)
Ferritin: 53 ng/mL (ref 16–288)
Iron: 89 ug/dL (ref 45–160)
TIBC: 309 mcg/dL (calc) (ref 250–450)

## 2021-05-24 LAB — HEMOGLOBIN A1C: Hgb A1c MFr Bld: 6.4 % (ref 4.6–6.5)

## 2021-05-26 LAB — CALPROTECTIN: Calprotectin: 589 mcg/g — ABNORMAL HIGH

## 2021-05-27 ENCOUNTER — Other Ambulatory Visit: Payer: Self-pay | Admitting: Family Medicine

## 2021-05-29 NOTE — Telephone Encounter (Signed)
P did not call insurance because she found out the price of Dexcom and Freestyle and neither one were affordable. Pt has been using accu-chek meter and supplies to check her sugar.  Please review and advise

## 2021-05-30 NOTE — Telephone Encounter (Signed)
There are no other continuous glucose meters on the market besides dexcom and libre. I guess pt is stuck with just continuing "regular" glucose testing.

## 2021-05-31 DIAGNOSIS — K5180 Other ulcerative colitis without complications: Secondary | ICD-10-CM | POA: Diagnosis not present

## 2021-06-11 NOTE — Telephone Encounter (Signed)
Spoke with pt to inform of update regarding glucometer. She voiced understanding.

## 2021-06-21 ENCOUNTER — Ambulatory Visit (HOSPITAL_BASED_OUTPATIENT_CLINIC_OR_DEPARTMENT_OTHER): Payer: Medicare PPO | Admitting: Obstetrics & Gynecology

## 2021-07-03 DIAGNOSIS — H04123 Dry eye syndrome of bilateral lacrimal glands: Secondary | ICD-10-CM | POA: Diagnosis not present

## 2021-07-03 DIAGNOSIS — H1045 Other chronic allergic conjunctivitis: Secondary | ICD-10-CM | POA: Diagnosis not present

## 2021-07-03 DIAGNOSIS — E119 Type 2 diabetes mellitus without complications: Secondary | ICD-10-CM | POA: Diagnosis not present

## 2021-07-03 DIAGNOSIS — H43811 Vitreous degeneration, right eye: Secondary | ICD-10-CM | POA: Diagnosis not present

## 2021-07-03 DIAGNOSIS — H2513 Age-related nuclear cataract, bilateral: Secondary | ICD-10-CM | POA: Diagnosis not present

## 2021-07-03 DIAGNOSIS — G4730 Sleep apnea, unspecified: Secondary | ICD-10-CM | POA: Diagnosis not present

## 2021-07-03 LAB — HM DIABETES EYE EXAM

## 2021-07-11 ENCOUNTER — Other Ambulatory Visit (INDEPENDENT_AMBULATORY_CARE_PROVIDER_SITE_OTHER): Payer: Self-pay | Admitting: *Deleted

## 2021-07-11 DIAGNOSIS — K5100 Ulcerative (chronic) pancolitis without complications: Secondary | ICD-10-CM

## 2021-07-12 ENCOUNTER — Encounter (INDEPENDENT_AMBULATORY_CARE_PROVIDER_SITE_OTHER): Payer: Self-pay

## 2021-07-14 ENCOUNTER — Other Ambulatory Visit: Payer: Self-pay | Admitting: Family Medicine

## 2021-07-23 ENCOUNTER — Ambulatory Visit: Payer: Medicare PPO | Admitting: Obstetrics and Gynecology

## 2021-07-26 DIAGNOSIS — R5383 Other fatigue: Secondary | ICD-10-CM | POA: Diagnosis not present

## 2021-07-26 DIAGNOSIS — K5180 Other ulcerative colitis without complications: Secondary | ICD-10-CM | POA: Diagnosis not present

## 2021-07-26 DIAGNOSIS — Z79899 Other long term (current) drug therapy: Secondary | ICD-10-CM | POA: Diagnosis not present

## 2021-07-26 DIAGNOSIS — Z111 Encounter for screening for respiratory tuberculosis: Secondary | ICD-10-CM | POA: Diagnosis not present

## 2021-08-13 NOTE — Progress Notes (Signed)
HPI: FU atrial fibrillation. Stress echocardiogram performed in September of 2011 was interpreted as normal. There was mention that LV function may not have improved as much as expected and if clinical suspicion high consider alternative imaging. Lexiscan Myoview in November of 2011 showed normal perfusion. Study not gated. CardioNet showed sinus with PVCs. Had EGD in November 2016 and developed atrial fibrillation. Placed on amiodarone and xarelto. Echocardiogram November 2016 showed normal LV function, moderate left ventricular hypertrophy, grade 1 diastolic dysfunction and mild left atrial enlargement. Amiodarone DCed at previous ov. Since she was last seen, she denies dyspnea, chest pain or syncope.  She had 1 episode of atrial fibrillation lasting approximately 30 minutes since last office visit.  She also is having difficulties with nosebleeds.  Current Outpatient Medications  Medication Sig Dispense Refill   ACCU-CHEK GUIDE test strip USE TO CHECK BLOOD GLUCOSE UP TO 6 TIMES DAILY AS DIRECTED 200 strip 1   Accu-Chek Softclix Lancets lancets USE TO CHECK BLOOD GLUCOSE UP TO 6 TIMES DAILY AS DIRECTED 200 each 1   acetaminophen (TYLENOL) 325 MG tablet Take 650 mg by mouth every 6 (six) hours as needed for moderate pain or headache.     B-D UF III MINI PEN NEEDLES 31G X 5 MM MISC USE TO INJECT INSULINS EQUAL TO 6 TIMES DAILY. 600 each 5   blood glucose meter kit and supplies KIT Use up to six times daily as directed. DX. E11.9 1 each 0   canagliflozin (INVOKANA) 100 MG TABS tablet Take 1 tablet (100 mg total) by mouth daily before breakfast. 90 tablet 3   citalopram (CELEXA) 20 MG tablet TAKE 1 TABLET BY MOUTH EVERY DAY 90 tablet 3   Dulaglutide (TRULICITY) 1.5 YT/0.3TW SOPN INJECT 1.5MG ONCE WEEKLY 18 mL 2   esomeprazole (NEXIUM) 20 MG capsule Take 20 mg by mouth daily.      ferric carboxymaltose (INJECTAFER) 750 MG/15ML SOLN injection Inject 15 mLs (750 mg total) into the vein as directed  for 1 dose. Repeat in 7 days 15 mL 1   furosemide (LASIX) 40 MG tablet TAKE 1 TABLET BY MOUTH EVERY DAY 90 tablet 3   gabapentin (NEURONTIN) 600 MG tablet TAKE 1 AND 1/2 TABLETS BY MOUTH TWICE A DAY 270 tablet 3   inFLIXimab (REMICADE) 100 MG injection Inject 100 mg into the vein every 8 (eight) weeks.      insulin regular human CONCENTRATED (HUMULIN R U-500 KWIKPEN) 500 UNIT/ML kwikpen Inject 125-135 Units into the skin See admin instructions. Inject 125 units with breakfast, 135 units with lunch, and 125 units with supper. 45 mL 1   lactase (LACTAID) 3000 UNITS tablet Take 3,000 Units by mouth as needed (when eating foods containing dairy).      loperamide (IMODIUM) 2 MG capsule Take 1 capsule (2 mg total) by mouth 2 (two) times daily as needed for diarrhea or loose stools. 90 capsule 5   meclizine (ANTIVERT) 25 MG tablet Take 1 tablet (25 mg total) by mouth 3 (three) times daily as needed for dizziness or nausea. 90 tablet 0   metoprolol succinate (TOPROL-XL) 100 MG 24 hr tablet TAKE 1 TABLET BY MOUTH 2 TIMES DAILY. TAKE WITH OR IMMEDIATELY FOLLOWING A MEAL 180 tablet 3   Pediatric Multivitamins-Iron (FLINTSTONES COMPLETE) 18 MG CHEW Chew 1 tablet by mouth 2 (two) times daily.     Polyvinyl Alcohol-Povidone (REFRESH OP) Place 1 drop into both eyes daily as needed (dry eyes).  potassium chloride (KLOR-CON M10) 10 MEQ tablet Take 1 tablet (10 mEq total) by mouth daily. 90 tablet 3   XARELTO 20 MG TABS tablet TAKE 1 TABLET BY MOUTH DAILY WITH SUPPER. 90 tablet 1   No current facility-administered medications for this visit.     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 ? to get liver bx if she gets bariatric surgery? Mild portal hypertensive gastropathy on EGD 08/2019.   Fibromyalgia    GAD (generalized anxiety disorder)    GERD     History of iron deficiency anemia 12/2018   Possibly inadequate absorption secondary to chronic/long term PPI therapy + portal hypertensive gastroduodenopathy. No GIB found on EGD, colonosc, and givens. Iron infusion approx 02/2021. [facility].   History of thrombocytopenia 12/2011   Hyperlipidemia    Intolerant of statins   HYPERTENSION    IBS (irritable bowel syndrome)    -D.  Good response to bentyl and imodium as of 06/2018 GI f/u.   IDDM (insulin dependent diabetes mellitus)    with DPN (managed by Dr. Cruzita Lederer but then in 2018 pt preferred to have me manage for her convenience)   Limited mobility    Requires a walker for arthritic pain, widespread musculoskeletal pain, and neuropathic pain.   Morbid obesity ([place])    As of 11/2018, pt considering sleeve gastectomy vs bipass as of eval by Dr. De Burrs considering as of 12/2019.   Nonalcoholic steatohepatitis    Viral Hep screens NEG.  CT 2015.  Transaminasemia.  U/S 07/2018 showed early changes of cirrhosis.   OSA (obstructive sleep apnea) 09/14/2015   sleep study 09/07/15: severe obstructive sleep apnea with an AHI of 72 and SaO2 low of 75%.>refer to sleep med for eval and tx options   Osteoarthritis    hips, shoulders, knees   PAF (paroxysmal atrial fibrillation) ([place])    One documented episode (after getting EGD 2016).  Was on amiodarone x 3 mo.  Rate control with metoprolol + anticoag with xarelto.    Recurrent epistaxis    Granuloma in L nare cauterized by ENT 04/2020. Another cautery 06/2020   Small fiber neuropathy    Due to DM.  Symmetric hands and feet tingling/numbness.   Ulcerative colitis ([place])    Remicade infusion Q 8 weeks: in clinical and endoscopic remission as of 12/2018 GI f/u.  06/11/19 rpt colonoscopy->cecal and ascending colon colitis.    Past Surgical History:  Procedure Laterality Date   ABDOMINAL HYSTERECTOMY  1980   Paps no longer indicated.   BACTERIAL OVERGROWTH TEST N/A 07/13/2015   Procedure:  BACTERIAL OVERGROWTH TEST;  Surgeon: Rogene Houston, MD;  [facility];  Service: Endoscopy;  Laterality: N/A;  730     BILATERAL SALPINGOOPHORECTOMY  02/10/2001   BIOPSY  06/11/2019   Procedure: BIOPSY;  Surgeon: Rogene Houston, MD;  [facility];  Service: Endoscopy;;  colon   BIOPSY  09/27/2019   Procedure: BIOPSY;  Surgeon: Rogene Houston, MD;  [facility];  Service: Endoscopy;;  gastric duodenal   BIOPSY  09/15/2020   Procedure: BIOPSY;  Surgeon: Harvel Quale, MD;  [facility];  Service: Gastroenterology;;   BREAST REDUCTION SURGERY  1994   bilat   CARDIOVASCULAR STRESS TEST  07/2010   Lexiscan myoview: normal   CARPAL TUNNEL RELEASE  Right 1996   CARPAL TUNNEL RELEASE Left 03/21/2003   CARPAL TUNNEL RELEASE Right 05/04/2013   Procedure: CARPAL TUNNEL RELEASE;  Surgeon: Cammie Sickle., MD;  Location: [employer];  Service: Orthopedics;  Laterality: Right;   CARPAL TUNNEL RELEASE Left 09/21/2013   Procedure: LEFT CARPAL TUNNEL RELEASE;  Surgeon: Cammie Sickle., MD;  Location: [facility];  Service: Orthopedics;  Laterality: Left;   CHOLECYSTECTOMY     COLONOSCOPY WITH PROPOFOL N/A 08/04/2015   Colitis in remission.  No polyps.  Procedure: COLONOSCOPY WITH PROPOFOL;  Surgeon: Rogene Houston, MD;  [facility];  Service: Endoscopy;  Laterality: N/A;  cecum time in  0820   time out  0827    total time 7 minutes   COLONOSCOPY WITH PROPOFOL N/A 06/11/2019   cecal and ascending colon colitis.  Procedure: COLONOSCOPY WITH PROPOFOL;  Surgeon: Rogene Houston, MD;  [facility];  Service: Endoscopy;  Laterality: N/A;  730a   COLONOSCOPY WITH PROPOFOL N/A 09/15/2020   Procedure: COLONOSCOPY WITH PROPOFOL;  Surgeon: Harvel Quale, MD;  [facility];  Service: Gastroenterology;  Laterality: N/A;  1030   ESOPHAGEAL DILATION N/A 08/04/2015   Procedure: ESOPHAGEAL  DILATION;  Surgeon: Rogene Houston, MD;  [facility];  Service: Endoscopy;  Laterality: N/AKelvin Cellar, no mucousal disruption   ESOPHAGOGASTRODUODENOSCOPY  09/27/2019   Performed for IDA.  Esoph dilation was done but no stricture present.  Mild portal hypertensive gastropathy, o/w normal.  Duodenal bx NEG.  h pylori neg.   ESOPHAGOGASTRODUODENOSCOPY (EGD) WITH ESOPHAGEAL DILATION  12/02/2005   ESOPHAGOGASTRODUODENOSCOPY (EGD) WITH PROPOFOL N/A 08/04/2015   Procedure: ESOPHAGOGASTRODUODENOSCOPY (EGD) WITH PROPOFOL;  Surgeon: Rogene Houston, MD;  [facility];  Service: Endoscopy;  Laterality: N/A;  procedure 1   ESOPHAGOGASTRODUODENOSCOPY (EGD) WITH PROPOFOL N/A 09/27/2019   Procedure: ESOPHAGOGASTRODUODENOSCOPY (EGD) WITH PROPOFOL;  Surgeon: Rogene Houston, MD;  [facility];  Service: Endoscopy;  Laterality: N/A;  12:10   FLEXIBLE SIGMOIDOSCOPY  01/17/2012   Procedure: FLEXIBLE SIGMOIDOSCOPY;  Surgeon: Rogene Houston, MD;  [facility];  Service: Endoscopy;  Laterality: N/A;   GIVENS CAPSULE STUDY N/A 08/03/2019   Procedure: GIVENS CAPSULE STUDY (performed for IDA)->some food debris in stomach and small amount of blood.  Surgeon: Rogene Houston, MD;  [facility];  Service: Endoscopy;  Laterality: N/A;  730AM   HEMILAMINOTOMY LUMBAR SPINE Bilateral 09/07/1999   L4-5   KNEE ARTHROSCOPY Right 01/1999; 10/2000   LYSIS OF ADHESION  02/10/2001   MALONEY DILATION  09/27/2019   Procedure: Venia Minks DILATION;  Surgeon: Rogene Houston, MD;  [facility];  Service: Endoscopy;;   [place]; 09/12/2006   TARSAL TUNNEL RELEASE  2002   TRANSTHORACIC ECHOCARDIOGRAM  08/04/2015   EF 60-65%, normal wall motion, mild LVH, mild LA dilation, grd I DD.   TUMOR EXCISION Left 03/21/2003   dorsal 1st web space (hand)   URETEROLYSIS Right 02/10/2001    Social History   Socioeconomic History   Marital status: Married    Spouse name: Not on file    Number of children: Not on file   Years of education: Not on file   Highest education level: Not on file  Occupational History   Occupation: Retired  Tobacco Use   Smoking status: Never   Smokeless tobacco: Never  Vaping Use   Vaping Use: Never used  Substance and Sexual Activity   Alcohol use: Not Currently    Comment: ocassionally   Drug use: No   Sexual activity: Not Currently    Partners: Male    Birth control/protection: Surgical    Comment: hysterectomy  Other Topics Concern   Not on file  Social History Narrative   She lives with husband in two-story home.  They have one grown son and 2 grandchildren.   She is retired [employer].   Highest of level education:  Some college.   Never smoker.   Alcohol: rare.   Social Determinants of Health   Financial Resource Strain: Medium Risk   Difficulty of Paying Living Expenses: Somewhat hard  Food Insecurity: No Food Insecurity   Worried About Charity fundraiser in the Last Year: Never true   Ran Out of Food in the Last Year: Never true  Transportation Needs: No Transportation Needs   Lack of Transportation (Medical): No   Lack of Transportation (Non-Medical): No  Physical Activity: Inactive   Days of Exercise per Week: 0 days   Minutes of Exercise per Session: 0 min  Stress: No Stress Concern Present   Feeling of Stress : Not at all  Social Connections: Moderately Isolated   Frequency of Communication with Friends and Family: More than three times a week   Frequency of Social Gatherings with Friends and Family: Once a week   Attends Religious Services: Never   Marine scientist or Organizations: No   Attends Music therapist: Never   Marital Status: Married  Human resources officer Violence: Not At Risk   Fear of Current or Ex-Partner: No   Emotionally Abused: No   Physically Abused: No   Sexually Abused: No    Family History  Problem Relation Age of Onset   Diabetes Mother    Hypertension  Mother    Heart attack Father        Mid 58's   Heart disease Father    Lung disease Father        spot on lung; had lung surgery   Alcohol abuse Other    Hypertension Son    Diabetes Son    Emphysema Maternal Grandfather    Asthma Maternal Grandfather    Colon cancer Paternal Grandfather    Stomach cancer Paternal Grandmother    Neuropathy Neg Hx     ROS: no fevers or chills, productive cough, hemoptysis, dysphasia, odynophagia, melena, hematochezia, dysuria, hematuria, rash, seizure activity, orthopnea, PND, pedal edema, claudication. Remaining systems are negative.  Physical Exam: Well-developed well-nourished in no acute distress.  Skin is warm and dry.  HEENT is normal.  Neck is supple.  Chest is clear to auscultation with normal expansion.  Cardiovascular exam is regular rate and rhythm.  Abdominal exam nontender or distended. No masses palpated. Extremities show no edema. neuro grossly intact  ECG-normal sinus rhythm, cannot rule out anterior infarct, left axis deviation.  Personally reviewed  A/P  1 paroxysmal atrial fibrillation-patient remains in sinus rhythm.  We will continue present dose of metoprolol and Xarelto.  She has had difficulties with nosebleeds.  She has seen ENT.  If these continue to be a problem we could consider referral for watchman device and discontinuing anticoagulation.  2 hypertension-blood pressure controlled.  Continue present medications and follow.  3 hyperlipidemia-Per primary care.  Kirk Ruths, MD

## 2021-08-15 ENCOUNTER — Ambulatory Visit: Payer: Medicare PPO | Admitting: Family Medicine

## 2021-08-16 ENCOUNTER — Encounter: Payer: Self-pay | Admitting: Cardiology

## 2021-08-16 ENCOUNTER — Other Ambulatory Visit: Payer: Self-pay

## 2021-08-16 ENCOUNTER — Ambulatory Visit: Payer: Medicare PPO | Admitting: Family Medicine

## 2021-08-16 ENCOUNTER — Ambulatory Visit (INDEPENDENT_AMBULATORY_CARE_PROVIDER_SITE_OTHER): Payer: Medicare PPO | Admitting: Cardiology

## 2021-08-16 VITALS — BP 134/60 | HR 73 | Ht 62.0 in | Wt 226.4 lb

## 2021-08-16 DIAGNOSIS — I10 Essential (primary) hypertension: Secondary | ICD-10-CM

## 2021-08-16 DIAGNOSIS — I480 Paroxysmal atrial fibrillation: Secondary | ICD-10-CM | POA: Diagnosis not present

## 2021-08-16 DIAGNOSIS — E7800 Pure hypercholesterolemia, unspecified: Secondary | ICD-10-CM

## 2021-08-16 NOTE — Patient Instructions (Signed)

## 2021-08-17 ENCOUNTER — Other Ambulatory Visit: Payer: Self-pay | Admitting: Family Medicine

## 2021-08-17 ENCOUNTER — Telehealth: Payer: Self-pay | Admitting: Family Medicine

## 2021-08-17 MED ORDER — SULFAMETHOXAZOLE-TRIMETHOPRIM 800-160 MG PO TABS: 1.0000 | ORAL_TABLET | Freq: Two times a day (BID) | ORAL | 0 refills | Status: AC

## 2021-08-17 NOTE — Telephone Encounter (Signed)
Pt states she is having urinary sxs; burning with urination, urgency, and odor that started 3 days ago. She started taking AZO tablets 3 days ago as well. This med normally helps clear it up but has not this time. Pt was advised this is no guarantee for medication and taking AZO tablets would not help provide clear results for any UA testing. Pt has upcoming appt on 11/22  Please review and advise

## 2021-08-17 NOTE — Telephone Encounter (Signed)
Pt advised rx sent in.

## 2021-08-17 NOTE — Telephone Encounter (Signed)
OK, bactrim eRx'd

## 2021-08-21 ENCOUNTER — Ambulatory Visit (INDEPENDENT_AMBULATORY_CARE_PROVIDER_SITE_OTHER): Payer: Medicare PPO | Admitting: Family Medicine

## 2021-08-21 ENCOUNTER — Other Ambulatory Visit: Payer: Self-pay

## 2021-08-21 ENCOUNTER — Encounter: Payer: Self-pay | Admitting: Family Medicine

## 2021-08-21 VITALS — BP 120/72 | HR 65 | Temp 97.9°F | Ht 62.0 in | Wt 223.2 lb

## 2021-08-21 DIAGNOSIS — K7581 Nonalcoholic steatohepatitis (NASH): Secondary | ICD-10-CM

## 2021-08-21 DIAGNOSIS — Z23 Encounter for immunization: Secondary | ICD-10-CM

## 2021-08-21 DIAGNOSIS — D508 Other iron deficiency anemias: Secondary | ICD-10-CM | POA: Diagnosis not present

## 2021-08-21 DIAGNOSIS — I480 Paroxysmal atrial fibrillation: Secondary | ICD-10-CM | POA: Diagnosis not present

## 2021-08-21 DIAGNOSIS — K7460 Unspecified cirrhosis of liver: Secondary | ICD-10-CM | POA: Diagnosis not present

## 2021-08-21 DIAGNOSIS — K5100 Ulcerative (chronic) pancolitis without complications: Secondary | ICD-10-CM | POA: Insufficient documentation

## 2021-08-21 DIAGNOSIS — Z794 Long term (current) use of insulin: Secondary | ICD-10-CM | POA: Diagnosis not present

## 2021-08-21 DIAGNOSIS — I10 Essential (primary) hypertension: Secondary | ICD-10-CM

## 2021-08-21 DIAGNOSIS — E1140 Type 2 diabetes mellitus with diabetic neuropathy, unspecified: Secondary | ICD-10-CM | POA: Diagnosis not present

## 2021-08-21 DIAGNOSIS — Z7901 Long term (current) use of anticoagulants: Secondary | ICD-10-CM | POA: Diagnosis not present

## 2021-08-21 DIAGNOSIS — E7800 Pure hypercholesterolemia, unspecified: Secondary | ICD-10-CM

## 2021-08-21 LAB — LIPID PANEL
Cholesterol: 165 mg/dL (ref 0–200)
HDL: 34.2 mg/dL — ABNORMAL LOW (ref >39.00)
LDL Cholesterol: 106 mg/dL — ABNORMAL HIGH (ref 0–99)
NonHDL: 130.76
Total CHOL/HDL Ratio: 5
Triglycerides: 122 mg/dL (ref 0.0–149.0)
VLDL: 24.4 mg/dL (ref 0.0–40.0)

## 2021-08-21 LAB — COMPREHENSIVE METABOLIC PANEL
ALT: 25 U/L (ref 0–35)
AST: 41 U/L — ABNORMAL HIGH (ref 0–37)
Albumin: 3.5 g/dL (ref 3.5–5.2)
Alkaline Phosphatase: 78 U/L (ref 39–117)
BUN: 12 mg/dL (ref 6–23)
CO2: 26 mEq/L (ref 19–32)
Calcium: 8.7 mg/dL (ref 8.4–10.5)
Chloride: 102 mEq/L (ref 96–112)
Creatinine, Ser: 0.99 mg/dL (ref 0.40–1.20)
GFR: 56.37 mL/min — ABNORMAL LOW (ref >60.00)
Glucose, Bld: 128 mg/dL — ABNORMAL HIGH (ref 70–99)
Potassium: 4 mEq/L (ref 3.5–5.1)
Sodium: 136 mEq/L (ref 135–145)
Total Bilirubin: 0.6 mg/dL (ref 0.2–1.2)
Total Protein: 7.1 g/dL (ref 6.0–8.3)

## 2021-08-21 LAB — CBC
HCT: 34.7 % — ABNORMAL LOW (ref 36.0–46.0)
Hemoglobin: 11.2 g/dL — ABNORMAL LOW (ref 12.0–15.0)
MCHC: 32.2 g/dL (ref 30.0–36.0)
MCV: 92.3 fl (ref 78.0–100.0)
Platelets: 146 10*3/uL — ABNORMAL LOW (ref 150.0–400.0)
RBC: 3.76 Mil/uL — ABNORMAL LOW (ref 3.87–5.11)
RDW: 14.3 % (ref 11.5–15.5)
WBC: 5.3 10*3/uL (ref 4.0–10.5)

## 2021-08-21 LAB — HEMOGLOBIN A1C: Hgb A1c MFr Bld: 6.6 % — ABNORMAL HIGH (ref 4.6–6.5)

## 2021-08-21 LAB — MICROALBUMIN / CREATININE URINE RATIO
Creatinine,U: 173.2 mg/dL
Microalb Creat Ratio: 0.5 mg/g (ref 0.0–30.0)
Microalb, Ur: 0.9 mg/dL (ref 0.0–1.9)

## 2021-08-21 MED ORDER — TRULICITY 1.5 MG/0.5ML ~~LOC~~ SOAJ: SUBCUTANEOUS | 3 refills | Status: DC

## 2021-08-21 MED ORDER — HUMULIN R U-500 KWIKPEN 500 UNIT/ML ~~LOC~~ SOPN: 125.0000 [IU] | PEN_INJECTOR | SUBCUTANEOUS | 1 refills | Status: DC

## 2021-08-21 MED ORDER — ZOSTER VAC RECOMB ADJUVANTED 50 MCG/0.5ML IM SUSR: 0.5000 mL | Freq: Once | INTRAMUSCULAR | 0 refills | Status: AC

## 2021-08-21 NOTE — Progress Notes (Signed)
OFFICE VISIT  08/21/2021  CC:  Chief Complaint  Patient presents with   Follow-up    RCI; pt is fasting   HPI:    Patient is a 75 y.o. female who presents for 3 mo f/u DM, HTN, IDA, HLD, and NASH cirrhosis. A/P as of last visit: "1) DM 2, with DPN: Glucoses very erratic as per her usual, no clear pattern. Signif improvement overall since starting invokana 100 qd so we'll continue this. She has been able to dec U-500 to 110 U qAC.  She has to be careful with adding a bedtime dose. Hba1c today. Feet exam today confirmed once again dense loss of sensation both feet.   2) HTN; well controlled on toprol xl 100 bid. Lytes/cr today.   3) NASH cirrhosis.  Followed by Dr. Laural [name]. Hepatic panel normal 01/2021, repeating today. Most recent liver u/s last month stable. Hep B (engerix) #3 today.   4) IDA: malabsorption (PPI long term + portal hypertensive gastropathy) + chronic blood loss d/t recurrent epistaxis. Complicated by xarelto for her stroke prophylaxis.  She does use saline nasal spray hs and humidified air in CPAP. She will call for f/u appt with her ENT, Dr. Wilburn Cornelia. She did get iron infusion at Dr. Melissa Noon office about 2-3 mo ago. She takes a flintstone MVI. CBC and iron panel today.   5) PAF: asymptomatic.  She is on rate control with toprol xl 100 bid and anticoag with xarelto 10 qd. Lytes/cr and cbc today."  INTERIM HX: Debrina is feeling well.  She had UTI symptoms a few days ago and I sent in Bactrim for her and she feels all symptoms are resolved.  Thing that bothers her the most is having recurrent nosebleeds still.  Always from left nostril.  History of cauterization by ENT. She cannot tolerate oral iron tablets.  She takes a Flintstone multivitamin with iron, though.  Diabetes: Home glucoses reviewed today.  They are much better--fasting average looks to be around 150.  Ranges 87 to around 200.  Bedtime average seems about 150 as well with a range of 60-180s.   She eats her breakfast around 10 AM and gives 110 units of U-500 at that time.  Her next meal is around 4 PM and she gives another 110 units at that time.  She then checks her glucose around bedtime and if greater than 200 she will give herself 50 units.  She has to give the at bedtime dose about once a week.  Hypertension: No home blood pressure monitoring data today.  Paroxysmal atrial fibrillation: She denies any palpitations or heart racing.  She takes Toprol-XL 100 mg/ twice daily and Xarelto 20 mg a day. No blood in urine or stool.  Frequent nosebleeds as noted above.  ROS as above, plus--> no fevers, no CP, no SOB, no wheezing, no cough, no dizziness, no HAs, no rashes, no melena/hematochezia.  No polyuria or polydipsia.  No myalgias or arthralgias.  No focal weakness, paresthesias, or tremors.  No acute vision or hearing abnormalities.  No dysuria or unusual/new urinary urgency or frequency.  No recent changes in lower legs. No n/v/d or abd pain.  No palpitations.     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 ? to get liver bx if she gets bariatric surgery? Mild portal hypertensive gastropathy on EGD 08/2019.  Fibromyalgia    GAD (generalized anxiety disorder)    GERD    History of iron deficiency anemia 12/2018   Possibly inadequate absorption secondary to chronic/long term PPI therapy + portal hypertensive gastroduodenopathy. No GIB found on EGD, colonosc, and givens. Iron infusion approx 02/2021. Flintstone MVI.   History of thrombocytopenia 12/2011   Hyperlipidemia    Intolerant of statins   HYPERTENSION    IBS (irritable bowel syndrome)    -D.  Good response to bentyl and imodium as of 06/2018 GI f/u.   IDDM (insulin dependent diabetes mellitus)    with DPN (managed by Dr. Gherghe but then in 2018 pt preferred to have me  manage for her convenience)   Limited mobility    Requires a walker for arthritic pain, widespread musculoskeletal pain, and neuropathic pain.   Morbid obesity (HCC)    As of 11/2018, pt considering sleeve gastectomy vs bipass as of eval by Dr. Newman->still considering as of 12/2019.   Nonalcoholic steatohepatitis    Viral Hep screens NEG.  CT 2015.  Transaminasemia.  U/S 07/2018 showed early changes of cirrhosis.   OSA (obstructive sleep apnea) 09/14/2015   sleep study 09/07/15: severe obstructive sleep apnea with an AHI of 72 and SaO2 low of 75%.>refer to sleep med for eval and tx options   Osteoarthritis    hips, shoulders, knees   PAF (paroxysmal atrial fibrillation) (HCC)    One documented episode (after getting EGD 2016).  Was on amiodarone x 3 mo.  Rate control with metoprolol + anticoag with xarelto.    Recurrent epistaxis    Granuloma in L nare cauterized by ENT 04/2020. Another cautery 06/2020   Small fiber neuropathy    Due to DM.  Symmetric hands and feet tingling/numbness.   Ulcerative colitis (HCC)    Remicade infusion Q 8 weeks: in clinical and endoscopic remission as of 12/2018 GI f/u.  06/11/19 rpt colonoscopy->cecal and ascending colon colitis.    Past Surgical History:  Procedure Laterality Date   ABDOMINAL HYSTERECTOMY  1980   Paps no longer indicated.   BACTERIAL OVERGROWTH TEST N/A 07/13/2015   Procedure: BACTERIAL OVERGROWTH TEST;  Surgeon: Najeeb U Rehman, MD;  [facility];  Service: Endoscopy;  Laterality: N/A;  730     BILATERAL SALPINGOOPHORECTOMY  02/10/2001   BIOPSY  06/11/2019   Procedure: BIOPSY;  Surgeon: Rehman, Najeeb U, MD;  [facility]: [facility];  Service: Endoscopy;;  colon   BIOPSY  09/27/2019   Procedure: BIOPSY;  Surgeon: Rehman, Najeeb U, MD;  [facility];  Service: Endoscopy;;  gastric duodenal   BIOPSY  09/15/2020   Procedure: BIOPSY;  Surgeon: Castaneda Mayorga, Daniel, MD;  [facility];  Service:  Gastroenterology;;   BREAST REDUCTION SURGERY  1994   bilat   CARDIOVASCULAR STRESS TEST  07/2010   Lexiscan myoview: normal   CARPAL TUNNEL RELEASE Right 1996   CARPAL TUNNEL RELEASE Left 03/21/2003   CARPAL TUNNEL RELEASE Right 05/04/2013   Procedure: CARPAL TUNNEL RELEASE;  Surgeon: Robert V Sypher Jr., MD;  Location: [facility];  Service: Orthopedics;  Laterality: Right;   CARPAL TUNNEL RELEASE Left 09/21/2013   Procedure: LEFT CARPAL TUNNEL RELEASE;  Surgeon: Robert V Sypher Jr., MD;  Location:  SURGERY CENTER;  Service: Orthopedics;  Laterality: Left;   CHOLECYSTECTOMY     COLONOSCOPY WITH PROPOFOL N/A 08/04/2015   Colitis in remission.  No polyps.  Procedure: COLONOSCOPY WITH PROPOFOL;  Surgeon: Najeeb   U Rehman, MD;  Location: [facility];  Service: Endoscopy;  Laterality: N/A;  cecum time in  0820   time out  0827    total time 7 minutes   COLONOSCOPY WITH PROPOFOL N/A 06/11/2019   cecal and ascending colon colitis.  Procedure: COLONOSCOPY WITH PROPOFOL;  Surgeon: Rehman, Najeeb U, MD;  [facility];  Service: Endoscopy;  Laterality: N/A;  730a   COLONOSCOPY WITH PROPOFOL N/A 09/15/2020   Procedure: COLONOSCOPY WITH PROPOFOL;  Surgeon: Castaneda Mayorga, Daniel, MD;  [facility];  Service: Gastroenterology;  Laterality: N/A;  1030   ESOPHAGEAL DILATION N/A 08/04/2015   Procedure: ESOPHAGEAL DILATION;  Surgeon: Najeeb U Rehman, MD;  Location: [facility];  Service: Endoscopy;  Laterality: N/A;  [place] 57, no mucousal disruption   ESOPHAGOGASTRODUODENOSCOPY  09/27/2019   Performed for IDA.  Esoph dilation was done but no stricture present.  Mild portal hypertensive gastropathy, o/w normal.  Duodenal bx NEG.  h pylori neg.   ESOPHAGOGASTRODUODENOSCOPY (EGD) WITH ESOPHAGEAL DILATION  12/02/2005   ESOPHAGOGASTRODUODENOSCOPY (EGD) WITH PROPOFOL N/A 08/04/2015   Procedure: ESOPHAGOGASTRODUODENOSCOPY (EGD) WITH PROPOFOL;  Surgeon: Najeeb U Rehman, MD;  [facility];  Service: Endoscopy;  Laterality: N/A;  procedure 1   ESOPHAGOGASTRODUODENOSCOPY (EGD) WITH PROPOFOL N/A 09/27/2019   Procedure: ESOPHAGOGASTRODUODENOSCOPY (EGD) WITH PROPOFOL;  Surgeon: Rehman, Najeeb U, MD;  [facility];  Service: Endoscopy;  Laterality: N/A;  12:10   FLEXIBLE SIGMOIDOSCOPY  01/17/2012   Procedure: FLEXIBLE SIGMOIDOSCOPY;  Surgeon: Najeeb U Rehman, MD;  [facility];  Service: Endoscopy;  Laterality: N/A;   GIVENS CAPSULE STUDY N/A 08/03/2019   Procedure: GIVENS CAPSULE STUDY (performed for IDA)->some food debris in stomach and small amount of blood.  Surgeon: Rehman, Najeeb U, MD;  [facility];  Service: Endoscopy;  Laterality: N/A;  730AM   HEMILAMINOTOMY LUMBAR SPINE Bilateral 09/07/1999   L4-5   KNEE ARTHROSCOPY Right 01/1999; 10/2000   LYSIS OF ADHESION  02/10/2001   MALONEY DILATION  09/27/2019   Procedure: MALONEY DILATION;  Surgeon: Rehman, Najeeb U, MD;  [facility];  Service: Endoscopy;;   RECTOCELE REPAIR  1990; 09/12/2006   TARSAL TUNNEL RELEASE  2002   TRANSTHORACIC ECHOCARDIOGRAM  08/04/2015   EF 60-65%, normal wall motion, mild LVH, mild LA dilation, grd I DD.   TUMOR EXCISION Left 03/21/2003   dorsal 1st web space (hand)   URETEROLYSIS Right 02/10/2001    Outpatient Medications Prior to Visit  Medication Sig Dispense Refill   ACCU-CHEK GUIDE test strip USE TO CHECK BLOOD GLUCOSE UP TO 6 TIMES DAILY AS DIRECTED 200 strip 1   Accu-Chek Softclix Lancets lancets USE TO CHECK BLOOD GLUCOSE UP TO 6 TIMES DAILY AS DIRECTED 200 each 1   acetaminophen (TYLENOL) 325 MG tablet Take 650 mg by mouth every 6 (six) hours as needed for moderate pain or headache.     B-D UF III MINI PEN NEEDLES 31G X 5 MM MISC USE TO INJECT INSULINS EQUAL TO 6 TIMES DAILY. 600 each 5   blood glucose meter kit and supplies KIT Use up to six times daily as directed. DX. E11.9 1 each 0   canagliflozin (INVOKANA) 100 MG TABS tablet Take 1  tablet (100 mg total) by mouth daily before breakfast. 90 tablet 3   citalopram (CELEXA) 20 MG tablet TAKE 1 TABLET BY MOUTH EVERY DAY 90 tablet 3   esomeprazole (NEXIUM) 20 MG capsule Take 20 mg by mouth   daily.      ferric carboxymaltose (INJECTAFER) 750 MG/15ML SOLN injection Inject 15 mLs (750 mg total) into the vein as directed for 1 dose. Repeat in 7 days 15 mL 1   furosemide (LASIX) 40 MG tablet TAKE 1 TABLET BY MOUTH EVERY DAY 90 tablet 1   gabapentin (NEURONTIN) 600 MG tablet TAKE 1 AND 1/2 TABLETS BY MOUTH TWICE A DAY 270 tablet 3   inFLIXimab (REMICADE) 100 MG injection Inject 100 mg into the vein every 8 (eight) weeks.      lactase (LACTAID) 3000 UNITS tablet Take 3,000 Units by mouth as needed (when eating foods containing dairy).      loperamide (IMODIUM) 2 MG capsule Take 1 capsule (2 mg total) by mouth 2 (two) times daily as needed for diarrhea or loose stools. 90 capsule 5   meclizine (ANTIVERT) 25 MG tablet Take 1 tablet (25 mg total) by mouth 3 (three) times daily as needed for dizziness or nausea. 90 tablet 0   metoprolol succinate (TOPROL-XL) 100 MG 24 hr tablet TAKE 1 TABLET BY MOUTH 2 TIMES DAILY. TAKE WITH OR IMMEDIATELY FOLLOWING A MEAL 180 tablet 3   Pediatric Multivitamins-Iron (FLINTSTONES COMPLETE) 18 MG CHEW Chew 1 tablet by mouth 2 (two) times daily.     Polyvinyl Alcohol-Povidone (REFRESH OP) Place 1 drop into both eyes daily as needed (dry eyes).     potassium chloride (KLOR-CON M10) 10 MEQ tablet Take 1 tablet (10 mEq total) by mouth daily. 90 tablet 3   XARELTO 20 MG TABS tablet TAKE 1 TABLET BY MOUTH DAILY WITH SUPPER. 90 tablet 1   Dulaglutide (TRULICITY) 1.5 MG/0.5ML SOPN INJECT 1.5MG ONCE WEEKLY 18 mL 2   insulin regular human CONCENTRATED (HUMULIN R U-500 KWIKPEN) 500 UNIT/ML kwikpen Inject 125-135 Units into the skin See admin instructions. Inject 125 units with breakfast, 135 units with lunch, and 125 units with supper. 45 mL 1   No facility-administered  medications prior to visit.    Allergies  Allergen Reactions   Omeprazole Anaphylaxis and Swelling    SWELLING OF TONGUE AND THROAT   Actos [Pioglitazone] Other (See Comments)    Weight gain   Benzocaine-Menthol Swelling    SWELLING OF MOUTH   Colesevelam Other (See Comments)    GI UPSET   Flagyl [Metronidazole Hcl] Other (See Comments)    DIAPHORESIS   Metformin And Related Diarrhea   Shrimp [Shellfish Allergy] Itching    OF THROAT AND EARS   Statins Palpitations   Allevyn Adhesive [Wound Dressings]     Other reaction(s): Unknown   Desipramine Hcl Itching, Nausea Only and Other (See Comments)    "swimmy" headed, ears itched    Hydromorphone Itching   Jardiance [Empagliflozin] Other (See Comments)    weakness   Lactose Intolerance (Gi) Diarrhea    Gas, bloating   Adhesive [Tape] Other (See Comments)    SKIN IRRITATION AND BRUISING   Nisoldipine Itching   Percocet [Oxycodone-Acetaminophen] Itching    ROS As per HPI  PE: Vitals with BMI 08/21/2021 08/16/2021 05/23/2021  Height 5' 2" 5' 2" -  Weight 223 lbs 3 oz 226 lbs 6 oz 233 lbs 10 oz  BMI 40.81 41.4 -  Systolic 120 134 118  Diastolic 72 60 72  Pulse 65 73 72   Gen: Alert, well appearing.  Patient is oriented to person, place, time, and situation. AFFECT: pleasant, lucid thought and speech. L nostril with a mucosal polypoid lesion on septum    LABS:    Lab Results  Component Value Date   TSH 1.09 08/18/2020   Lab Results  Component Value Date   WBC 3.4 (L) 05/23/2021   HGB 12.2 05/23/2021   HCT 37.7 05/23/2021   MCV 97.8 05/23/2021   PLT 103.0 (L) 05/23/2021   Lab Results  Component Value Date   IRON 89 05/23/2021   TIBC 309 05/23/2021   FERRITIN 53 05/23/2021   Lab Results  Component Value Date   CREATININE 0.75 05/23/2021   BUN 12 05/23/2021   NA 137 05/23/2021   K 4.0 05/23/2021   CL 101 05/23/2021   CO2 28 05/23/2021   Lab Results  Component Value Date   ALT 33 05/23/2021   AST 51  (H) 05/23/2021   ALKPHOS 94 05/23/2021   BILITOT 0.7 05/23/2021   Lab Results  Component Value Date   CHOL 202 (H) 04/18/2020   Lab Results  Component Value Date   HDL 41.50 04/18/2020   Lab Results  Component Value Date   LDLCALC 139 (H) 04/18/2020   Lab Results  Component Value Date   TRIG 108.0 04/18/2020   Lab Results  Component Value Date   CHOLHDL 5 04/18/2020   Lab Results  Component Value Date   HGBA1C 6.4 05/23/2021    IMPRESSION AND PLAN:  #1 type 2 diabetes, with diabetic peripheral neuropathy.  Her control has improved significantly.  Continue Invokana 782 mg a day, Trulicity 1.5 mg subcu once a week, and U-500 insulin 110 units with first meal of the day and 110 units with second meal (she only eats 2 meals a day).  She seems to do okay with giving some additional insulin at bedtime if her sugars are greater than 200. Hemoglobin A1c today as well as urine microalbumin/creatinine.  2.  Hypertension.  Well-controlled continue Toprol-XL 100 mg twice a day.  3.  Hyperlipidemia history of statin intolerance.  Lipids and hepatic panel today.  4.  Iron deficiency anemia due to malabsorption plus chronic blood loss from epistaxis. She will continue the small amount of oral iron supplement that she can tolerate.  Additionally, she will contact her ENT to set up appointment to address her ongoing epistaxis. CBC and iron panel today.  #5 NASH cirrhosis.  Followed by Dr. Laural [name]. Hepatic panel normal 04/2021, repeating today. Most recent liver u/s July 2022 stable. She has been vaccinated for HepB.  6 paroxysmal atrial fibrillation.  Asymptomatic.  Continue metoprolol and anticoagulation. She gets appropriate follow-up with cardiology.  An After Visit Summary was printed and given to the patient.  FOLLOW UP: Return in about 3 months (around 11/21/2021) for routine chronic illness f/u. Next cpe 01/2022  Signed:  Crissie Sickles, MD           08/21/2021

## 2021-08-22 LAB — IRON,TIBC AND FERRITIN PANEL
%SAT: 14 % (calc) — ABNORMAL LOW (ref 16–45)
Ferritin: 11 ng/mL — ABNORMAL LOW (ref 16–288)
Iron: 50 ug/dL (ref 45–160)
TIBC: 361 mcg/dL (calc) (ref 250–450)

## 2021-08-28 ENCOUNTER — Telehealth: Payer: Self-pay

## 2021-08-28 NOTE — Telephone Encounter (Signed)
Patient returning call to office. Please call patient back when ever you are available

## 2021-08-28 NOTE — Telephone Encounter (Signed)
[name], [place]  08/28/2021  2:14 PM EST     Spoke with pt regarding results/recommendations,voiced understanding. Lab visit scheduled, 12/6. She will contact Dr.Shoemaker and get back with [place] and if f/u not within 2 weeks. Pt would like to know results of urine microalbumin    Message sent to provider for urine results from original result note.

## 2021-08-31 ENCOUNTER — Other Ambulatory Visit: Payer: Self-pay | Admitting: Family Medicine

## 2021-08-31 NOTE — Telephone Encounter (Signed)
Appt scheduled with Dr. Wilburn Cornelia on 10/12/21 @ 2:45

## 2021-09-04 ENCOUNTER — Other Ambulatory Visit: Payer: Self-pay

## 2021-09-04 ENCOUNTER — Ambulatory Visit (INDEPENDENT_AMBULATORY_CARE_PROVIDER_SITE_OTHER): Payer: Medicare PPO

## 2021-09-04 ENCOUNTER — Telehealth: Payer: Self-pay

## 2021-09-04 DIAGNOSIS — R7989 Other specified abnormal findings of blood chemistry: Secondary | ICD-10-CM

## 2021-09-04 LAB — BASIC METABOLIC PANEL
BUN: 9 mg/dL (ref 6–23)
CO2: 30 mEq/L (ref 19–32)
Calcium: 8.9 mg/dL (ref 8.4–10.5)
Chloride: 102 mEq/L (ref 96–112)
Creatinine, Ser: 0.7 mg/dL (ref 0.40–1.20)
GFR: 85.43 mL/min (ref >60.00)
Glucose, Bld: 153 mg/dL — ABNORMAL HIGH (ref 70–99)
Potassium: 3.2 mEq/L — ABNORMAL LOW (ref 3.5–5.1)
Sodium: 138 mEq/L (ref 135–145)

## 2021-09-04 NOTE — Telephone Encounter (Signed)
Tried calling patient, unable to LVM.

## 2021-09-04 NOTE — Telephone Encounter (Signed)
Pt came for lab visit and dropped off forms to be completed. Envelope with forms given to [facility].

## 2021-09-05 ENCOUNTER — Other Ambulatory Visit: Payer: Self-pay

## 2021-09-05 DIAGNOSIS — E1140 Type 2 diabetes mellitus with diabetic neuropathy, unspecified: Secondary | ICD-10-CM

## 2021-09-05 MED ORDER — HUMULIN R U-500 KWIKPEN 500 UNIT/ML ~~LOC~~ SOPN: 125.0000 [IU] | PEN_INJECTOR | SUBCUTANEOUS | 3 refills | Status: DC

## 2021-09-05 NOTE — Telephone Encounter (Signed)
Signed and put in box to go up front. Signed:  Crissie Sickles, MD           09/05/2021

## 2021-09-05 NOTE — Telephone Encounter (Signed)
Placed on PCP desk to review and sign, if appropriate. Pt would like this to be faxed and will pick up a copy during her next appt.

## 2021-09-05 NOTE — Telephone Encounter (Signed)
Tried calling patient, unable to LVM.   [name] - Client Nonclinical Telephone Record  [facility] Client [name] - Client Client Site [place] - Day Provider Crissie Sickles - MD Contact Type Call Who Is Calling Patient / Member / Family / Caregiver Caller Name [place] Phone Number [phone] Patient Name [name] Patient DOB [birth year] Call Type Message Only Information Provided Reason for Call Request for General Office Information Initial Comment Caller needs to speak with [name] about insurance. Disp. Time Disposition Final User 09/04/2021 4:51:35 PM General Information Provided Yes Zane Herald Call Closed By: Zane Herald Transaction Date/Time: 09/04/2021 4:49:29 PM (ET)

## 2021-09-05 NOTE — Telephone Encounter (Signed)
Spoke with pt and advised I would follow up with [facility] and ask if prior application completed on 11/22 during pt's last OV received. Pt mentioned they would also need new rx for the year and she only had 2 pens left

## 2021-09-13 ENCOUNTER — Ambulatory Visit (HOSPITAL_BASED_OUTPATIENT_CLINIC_OR_DEPARTMENT_OTHER): Payer: Medicare PPO | Admitting: Obstetrics & Gynecology

## 2021-09-20 DIAGNOSIS — K5180 Other ulcerative colitis without complications: Secondary | ICD-10-CM | POA: Diagnosis not present

## 2021-09-22 ENCOUNTER — Other Ambulatory Visit: Payer: Self-pay | Admitting: Family Medicine

## 2021-09-25 ENCOUNTER — Other Ambulatory Visit: Payer: Self-pay

## 2021-09-25 DIAGNOSIS — Z794 Long term (current) use of insulin: Secondary | ICD-10-CM

## 2021-09-25 MED ORDER — TRULICITY 1.5 MG/0.5ML ~~LOC~~ SOAJ: SUBCUTANEOUS | 3 refills | Status: DC

## 2021-09-25 MED ORDER — HUMULIN R U-500 KWIKPEN 500 UNIT/ML ~~LOC~~ SOPN: 125.0000 [IU] | PEN_INJECTOR | SUBCUTANEOUS | 3 refills | Status: DC

## 2021-09-25 NOTE — Telephone Encounter (Signed)
RF request for citalopram, gabapentin, metoprolol LOV: 08/21/21 Next ov: 11/21/21 Last written: 09/21/20 1 year supply  Please review and advise. Meds pending

## 2021-10-08 ENCOUNTER — Other Ambulatory Visit: Payer: Self-pay | Admitting: Family Medicine

## 2021-10-08 ENCOUNTER — Other Ambulatory Visit: Payer: Self-pay | Admitting: Cardiology

## 2021-10-08 DIAGNOSIS — E1140 Type 2 diabetes mellitus with diabetic neuropathy, unspecified: Secondary | ICD-10-CM

## 2021-10-08 NOTE — Telephone Encounter (Signed)
Prescription refill request for Xarelto received.  Indication:Afib Last office visit:11/22 Weight:101.2 kg Age:76 Scr:0.7 CrCl:112.65 ml/min  Prescription refilled

## 2021-10-09 ENCOUNTER — Telehealth: Payer: Self-pay

## 2021-10-09 NOTE — Telephone Encounter (Signed)
Pt called because she is running out of insulin. Pt has Rx thru patient assistance program and has not received medication yet. Advised pt that Rx was sent on 09/05/21 to [facility]. Rx was reordered & printed as well on 09/25/21. Please give pt a call with more information. Thanks

## 2021-10-09 NOTE — Telephone Encounter (Signed)
Tried to contact patient assistance, [facility] and unable to speak with someone after 45 minute wait. Pt advised no available updates currently. Will have representative call back when available, estimated wait time is 99 minutes. She has 2 pens left of Humulin R and is almost out of Trulicity as well.

## 2021-10-12 DIAGNOSIS — J342 Deviated nasal septum: Secondary | ICD-10-CM | POA: Diagnosis not present

## 2021-10-12 DIAGNOSIS — G4733 Obstructive sleep apnea (adult) (pediatric): Secondary | ICD-10-CM | POA: Diagnosis not present

## 2021-10-12 DIAGNOSIS — R040 Epistaxis: Secondary | ICD-10-CM | POA: Diagnosis not present

## 2021-10-12 NOTE — Telephone Encounter (Signed)
Spoke with Crystal at [facility] and new rx received from 12/27 that was needed for processing. Nothing else needed. They will finish processing pt's enrollment status and notify pt as well as [place] with determination.

## 2021-10-16 ENCOUNTER — Ambulatory Visit (INDEPENDENT_AMBULATORY_CARE_PROVIDER_SITE_OTHER): Payer: Medicare PPO | Admitting: Internal Medicine

## 2021-10-16 ENCOUNTER — Encounter (INDEPENDENT_AMBULATORY_CARE_PROVIDER_SITE_OTHER): Payer: Self-pay | Admitting: Internal Medicine

## 2021-10-16 ENCOUNTER — Other Ambulatory Visit: Payer: Self-pay

## 2021-10-16 VITALS — BP 112/72 | HR 72 | Temp 98.3°F | Ht 62.0 in | Wt 221.1 lb

## 2021-10-16 DIAGNOSIS — K7460 Unspecified cirrhosis of liver: Secondary | ICD-10-CM

## 2021-10-16 DIAGNOSIS — K219 Gastro-esophageal reflux disease without esophagitis: Secondary | ICD-10-CM

## 2021-10-16 DIAGNOSIS — K7581 Nonalcoholic steatohepatitis (NASH): Secondary | ICD-10-CM

## 2021-10-16 DIAGNOSIS — K580 Irritable bowel syndrome with diarrhea: Secondary | ICD-10-CM

## 2021-10-16 DIAGNOSIS — K5100 Ulcerative (chronic) pancolitis without complications: Secondary | ICD-10-CM

## 2021-10-16 DIAGNOSIS — K589 Irritable bowel syndrome without diarrhea: Secondary | ICD-10-CM | POA: Insufficient documentation

## 2021-10-16 DIAGNOSIS — Z862 Personal history of diseases of the blood and blood-forming organs and certain disorders involving the immune mechanism: Secondary | ICD-10-CM

## 2021-10-16 MED ORDER — ESOMEPRAZOLE MAGNESIUM 20 MG PO CPDR: 20.0000 mg | DELAYED_RELEASE_CAPSULE | Freq: Every day | ORAL | 3 refills | Status: DC

## 2021-10-16 NOTE — Progress Notes (Signed)
Presenting complaint;  Follow-up for ulcerative colitis, diarrhea/IBS, GERD and cirrhosis. History of iron deficiency anemia.  Database and subjective:  Patient is 76 year old Caucasian female with history of ulcerative colitis which was diagnosed sometime in the 1980s currently maintained on infliximab infusion, history of GERD maintained on low-dose PPI, cirrhosis secondary to NASH history of iron deficiency anemia in diarrhea/IBS is here for scheduled visit.  She was last seen in July 2022.  She has no GI complaints.  She says if she watches her diet she generally has 1 formed stool daily.  She is on dicyclomine 10 mg before each meal.  She says dicyclomine is working very well and she rarely needs Imodium.  Last episode was diarrhea was before Christmas lasting for a day.  She denies abdominal pain melena or rectal bleeding. She says heartburn is well controlled with esomeprazole 20 mg daily.  She experiences bad heartburn without the medication.  She denies dysphagia nausea or vomiting. She has not had a chance to provide stool sample to the lab for fecal calprotectin.  Her fecal calprotectin on 05/21/2021 was 589.  Prior GI studies  Last EGD was in December 2020 revealing portal gastropathy but no evidence of varices. Last colonoscopy was in December 2021 by Dr. Jenetta Downer when she had multiple biopsies revealing normal mucosa except she had moderate activity/active colitis at sigmoid colon.  Current Medications: Outpatient Encounter Medications as of 10/16/2021  Medication Sig   ACCU-CHEK GUIDE test strip USE TO CHECK BLOOD GLUCOSE UP TO 6 TIMES DAILY AS DIRECTED   Accu-Chek Softclix Lancets lancets USE TO CHECK BLOOD GLUCOSE UP TO 6 TIMES DAILY AS DIRECTED   acetaminophen (TYLENOL) 325 MG tablet Take 650 mg by mouth every 6 (six) hours as needed for moderate pain or headache.   B-D UF III MINI PEN NEEDLES 31G X 5 MM MISC USE TO INJECT INSULINS EQUAL TO 6 TIMES DAILY.   blood glucose  meter kit and supplies KIT Use up to six times daily as directed. DX. E11.9   canagliflozin (INVOKANA) 100 MG TABS tablet Take 1 tablet (100 mg total) by mouth daily before breakfast.   citalopram (CELEXA) 20 MG tablet TAKE 1 TABLET BY MOUTH EVERY DAY   Dulaglutide (TRULICITY) 1.5 PH/1.5AV SOPN INJECT 1.5MG ONCE WEEKLY   esomeprazole (NEXIUM) 20 MG capsule Take 1 capsule (20 mg total) by mouth daily before breakfast. Please do not substitute for generic prep   furosemide (LASIX) 40 MG tablet TAKE 1 TABLET BY MOUTH EVERY DAY   gabapentin (NEURONTIN) 600 MG tablet TAKE 1 AND 1/2 TABLETS BY MOUTH TWICE A DAY   inFLIXimab (REMICADE) 100 MG injection Inject 100 mg into the vein every 8 (eight) weeks.    insulin regular human CONCENTRATED (HUMULIN R U-500 KWIKPEN) 500 UNIT/ML KwikPen Inject 125-135 Units into the skin See admin instructions. Inject 110 units with breakfast, 110 units with dinner, and 50 U q evening prn hyperglycemia   lactase (LACTAID) 3000 UNITS tablet Take 3,000 Units by mouth as needed (when eating foods containing dairy).    loperamide (IMODIUM) 2 MG capsule Take 1 capsule (2 mg total) by mouth 2 (two) times daily as needed for diarrhea or loose stools.   meclizine (ANTIVERT) 25 MG tablet Take 1 tablet (25 mg total) by mouth 3 (three) times daily as needed for dizziness or nausea.   metoprolol succinate (TOPROL-XL) 100 MG 24 hr tablet TAKE 1 TABLET BY MOUTH 2 TIMES DAILY. TAKE WITH OR IMMEDIATELY FOLLOWING A MEAL  Pediatric Multivitamins-Iron (FLINTSTONES COMPLETE) 18 MG CHEW Chew 1 tablet by mouth 2 (two) times daily.   potassium chloride (KLOR-CON M10) 10 MEQ tablet Take 1 tablet (10 mEq total) by mouth daily.   XARELTO 20 MG TABS tablet TAKE 1 TABLET BY MOUTH DAILY WITH SUPPER   [DISCONTINUED] esomeprazole (NEXIUM) 20 MG capsule Take 20 mg by mouth daily.    ferric carboxymaltose (INJECTAFER) 750 MG/15ML SOLN injection Inject 15 mLs (750 mg total) into the vein as directed for 1  dose. Repeat in 7 days (Patient not taking: Reported on 10/16/2021)   [DISCONTINUED] amitriptyline (ELAVIL) 25 MG tablet Take 1 tablet (25 mg total) by mouth at bedtime. start with 1/2 tab at night for 1 week then increase to 1 whole tab.   [DISCONTINUED] bromocriptine (PARLODEL) 2.5 MG tablet Take 2.5 mg by mouth 2 (two) times daily.   [DISCONTINUED] Polyvinyl Alcohol-Povidone (REFRESH OP) Place 1 drop into both eyes daily as needed (dry eyes).   [DISCONTINUED] potassium chloride (K-DUR) 10 MEQ tablet Take 2 tablets (20 mEq total) by mouth daily.   No facility-administered encounter medications on file as of 10/16/2021.     Objective: Blood pressure 112/72, pulse 72, temperature 98.3 F (36.8 C), temperature source Oral, height 5' 2"  (1.575 m), weight 221 lb 1.6 oz (100.3 kg). Patient is alert and in no acute distress. She does not have tremors or asterixis. Conjunctiva is pink. Sclera is nonicteric Oropharyngeal mucosa is normal. No neck masses or thyromegaly noted. Cardiac exam with regular rhythm normal S1 and S2. No murmur or gallop noted. Lungs are clear to auscultation. Abdomen is symmetrical.  On palpation is soft and nontender with no organomegaly or masses. She has nonpitting edema involving both legs.  Labs/studies Results:   CBC Latest Ref Rng & Units 08/21/2021 05/23/2021 02/21/2021  WBC 4.0 - 10.5 K/uL 5.3 3.4(L) 5.7  Hemoglobin 12.0 - 15.0 g/dL 11.2(L) 12.2 10.6(L)  Hematocrit 36.0 - 46.0 % 34.7(L) 37.7 33.6(L)  Platelets 150.0 - 400.0 K/uL 146.0(L) 103.0(L) 145.0(L)    CMP Latest Ref Rng & Units 09/04/2021 08/21/2021 05/23/2021  Glucose 70 - 99 mg/dL 153(H) 128(H) 327(H)  BUN 6 - 23 mg/dL 9 12 12   Creatinine 0.40 - 1.20 mg/dL 0.70 0.99 0.75  Sodium 135 - 145 mEq/L 138 136 137  Potassium 3.5 - 5.1 mEq/L 3.2(L) 4.0 4.0  Chloride 96 - 112 mEq/L 102 102 101  CO2 19 - 32 mEq/L 30 26 28   Calcium 8.4 - 10.5 mg/dL 8.9 8.7 8.8  Total Protein 6.0 - 8.3 g/dL - 7.1 6.9  Total  Bilirubin 0.2 - 1.2 mg/dL - 0.6 0.7  Alkaline Phos 39 - 117 U/L - 78 94  AST 0 - 37 U/L - 41(H) 51(H)  ALT 0 - 35 U/L - 25 33    Hepatic Function Latest Ref Rng & Units 08/21/2021 05/23/2021 02/21/2021  Total Protein 6.0 - 8.3 g/dL 7.1 6.9 6.8  Albumin 3.5 - 5.2 g/dL 3.5 3.3(L) 3.3(L)  AST 0 - 37 U/L 41(H) 51(H) 43(H)  ALT 0 - 35 U/L 25 33 29  Alk Phosphatase 39 - 117 U/L 78 94 73  Total Bilirubin 0.2 - 1.2 mg/dL 0.6 0.7 0.6  Bilirubin, Direct 0.0 - 0.3 mg/dL - - -     Ultrasound April 25, 2021 11 x 9 mm hypoechoic lesion involving the left lobe of the liver. Increased echogenicity of the liver. Splenomegaly.  Spleen length 14.4 cm. No ascites.  Alpha-fetoprotein 4.8 on 05/15/2021  Iron studies on 08/21/2021 Serum iron 50, TIBC 361 and saturation 14%.  Iron studies on 05/23/2021 Serum iron 89 TIBC 309 saturation 29%     Assessment:  #1.  Chronic ulcerative colitis.  Disease duration 40+ years.  Last colonoscopy was in December 2021 revealing localized disease to sigmoid colon.  She remains in remission while on infliximab infusion every 8 weeks which she is receiving at rheumatology office in [place]/Dr. [facility] office.  Fecal calprotectin has not been completed yet.  Next surveillance colonoscopy will be in December 2026.    #2.  Chronic GERD.  She is doing well with dietary measures and low-dose esomeprazole but she can only tolerate brand name prescription.  She has tried generic preparation but has developed throat swelling.  If it is not covered by her plan she will buy OTC.  #3.  History of iron deficiency anemia dating back to 2020.  She had colonoscopy and September 2020 given capsule study in November 2020 and esophagogastroduodenoscopy in December 2020.  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  She has required intermittent iron infusion but not in the last 6 months or so.  She remains on low-dose oral iron.  #4.   IBS/diarrhea.  She is having 1 formed stool daily Catherene Kaleta on dicyclomine at a low dose.  She is using loperamide sparingly  #5.  Cirrhosis secondary to NASH.  She has had elevated transaminases and echogenic liver for many years.  Now she has developed contour changes to liver suggestive of cirrhosis.  EGD in December 2020 that revealed mild portal gastropathy but no varices.  She has 11 x 9 mm hypoechoic lesion in the left lobe of the liver which was further evaluated with MR and CT in May 2021. Ultrasound in July revealed this lesion to be stable.  She is due for [facility] screening.   Plan:  Patient will try to provide stool sample to the lab for fecal calprotectin. Prescription for Nexium 20 mg p.o. every morning sent to patient's pharmacy for 90 days with 3 refills.  If insurance does not pay for brand-name medication she will keep buying OTC prep. CBC with differential, INR, LFTs and alpha-fetoprotein. Abdominal ultrasound with liver Doppler study. Esophagogastroduodenoscopy under monitored anesthesia care to screen for esophageal varices. Office visit with Dr. Jenetta Downer in 6 months.

## 2021-10-16 NOTE — Patient Instructions (Signed)
Physician will call with results of blood test and ultrasound completed. Esophagogastroduodenoscopy to be scheduled next month. Will need to stop Xarelto 3 days before procedure.

## 2021-10-17 ENCOUNTER — Other Ambulatory Visit (INDEPENDENT_AMBULATORY_CARE_PROVIDER_SITE_OTHER): Payer: Self-pay | Admitting: Internal Medicine

## 2021-10-17 ENCOUNTER — Other Ambulatory Visit (INDEPENDENT_AMBULATORY_CARE_PROVIDER_SITE_OTHER): Payer: Self-pay | Admitting: *Deleted

## 2021-10-17 LAB — CBC WITH DIFFERENTIAL/PLATELET
Absolute Monocytes: 377 cells/uL (ref 200–950)
Basophils Absolute: 41 cells/uL (ref 0–200)
Basophils Relative: 0.8 %
Eosinophils Absolute: 97 cells/uL (ref 15–500)
Eosinophils Relative: 1.9 %
HCT: 34.1 % — ABNORMAL LOW (ref 35.0–45.0)
Hemoglobin: 10.8 g/dL — ABNORMAL LOW (ref 11.7–15.5)
Lymphs Abs: 2907 cells/uL (ref 850–3900)
MCH: 26.9 pg — ABNORMAL LOW (ref 27.0–33.0)
MCHC: 31.7 g/dL — ABNORMAL LOW (ref 32.0–36.0)
MCV: 84.8 fL (ref 80.0–100.0)
MPV: 11.2 fL (ref 7.5–12.5)
Monocytes Relative: 7.4 %
Neutro Abs: 1678 cells/uL (ref 1500–7800)
Neutrophils Relative %: 32.9 %
Platelets: 151 10*3/uL (ref 140–400)
RBC: 4.02 10*6/uL (ref 3.80–5.10)
RDW: 14.4 % (ref 11.0–15.0)
Total Lymphocyte: 57 %
WBC: 5.1 10*3/uL (ref 3.8–10.8)

## 2021-10-17 LAB — HEPATIC FUNCTION PANEL
AG Ratio: 0.9 (calc) — ABNORMAL LOW (ref 1.0–2.5)
ALT: 25 U/L (ref 6–29)
AST: 46 U/L — ABNORMAL HIGH (ref 10–35)
Albumin: 3.5 g/dL — ABNORMAL LOW (ref 3.6–5.1)
Alkaline phosphatase (APISO): 70 U/L (ref 37–153)
Bilirubin, Direct: 0.1 mg/dL (ref 0.0–0.2)
Globulin: 3.9 g/dL (calc) — ABNORMAL HIGH (ref 1.9–3.7)
Indirect Bilirubin: 0.5 mg/dL (calc) (ref 0.2–1.2)
Total Bilirubin: 0.6 mg/dL (ref 0.2–1.2)
Total Protein: 7.4 g/dL (ref 6.1–8.1)

## 2021-10-17 LAB — AFP TUMOR MARKER: AFP-Tumor Marker: 4.5 ng/mL

## 2021-10-17 LAB — PROTIME-INR
INR: 1.4 — ABNORMAL HIGH
Prothrombin Time: 14 s — ABNORMAL HIGH (ref 9.0–11.5)

## 2021-10-19 ENCOUNTER — Telehealth (INDEPENDENT_AMBULATORY_CARE_PROVIDER_SITE_OTHER): Payer: Self-pay

## 2021-10-19 NOTE — Telephone Encounter (Signed)
PA on Esomeprazole ( NO AUTH Needed per Cover my meds) 10/19/2021.

## 2021-10-23 ENCOUNTER — Other Ambulatory Visit (INDEPENDENT_AMBULATORY_CARE_PROVIDER_SITE_OTHER): Payer: Self-pay

## 2021-10-23 ENCOUNTER — Telehealth: Payer: Self-pay | Admitting: *Deleted

## 2021-10-23 DIAGNOSIS — Z862 Personal history of diseases of the blood and blood-forming organs and certain disorders involving the immune mechanism: Secondary | ICD-10-CM

## 2021-10-23 DIAGNOSIS — K7460 Unspecified cirrhosis of liver: Secondary | ICD-10-CM

## 2021-10-23 DIAGNOSIS — K219 Gastro-esophageal reflux disease without esophagitis: Secondary | ICD-10-CM

## 2021-10-23 DIAGNOSIS — K580 Irritable bowel syndrome with diarrhea: Secondary | ICD-10-CM

## 2021-10-23 NOTE — Telephone Encounter (Signed)
° °  Pre-operative Risk Assessment    Patient Name: [name]  DOB: 04-18-1946 MRN: [id number]      Request for Surgical Clearance    Procedure:   Upper endoscopy  Date of Surgery:  Clearance 10/31/21                                 Surgeon:  Dr [name] Surgeon's Group or Practice Name:  [facility] Phone number:  [phone] Fax number:  [phone]   Type of Clearance Requested:   - Pharmacy:  Hold Rivaroxaban (Xarelto) for 2 days   Type of Anesthesia:  MAC   Additional requests/questions:    Elsworth Soho, Karthik Whittinghill L   10/23/2021, 2:15 PM

## 2021-10-24 ENCOUNTER — Encounter (INDEPENDENT_AMBULATORY_CARE_PROVIDER_SITE_OTHER): Payer: Self-pay | Admitting: *Deleted

## 2021-10-24 ENCOUNTER — Encounter (INDEPENDENT_AMBULATORY_CARE_PROVIDER_SITE_OTHER): Payer: Self-pay

## 2021-10-24 NOTE — Telephone Encounter (Signed)
° °  Name: [name]  DOB: 04-15-1946  MRN: [id number]   Primary Cardiologist: Kirk Ruths, MD  Chart reviewed as part of pre-operative protocol coverage. We have been asked for guidance to hold xarelto. Per our clinical pharmacist:  Patient with diagnosis of Afib on Xarelto for anticoagulation.     Procedure: Upper endoscopy Date of procedure: 10/31/2021   CHA2DS2-VASc Score = 4  This indicates a 4.8% annual risk of stroke.The patient's score is based upon: CHF History: 0 HTN History: 1 Diabetes History: 1 Stroke History: 0 Vascular Disease History: 0 Age Score: 1 Gender Score: 1   CrCl 112 mL/min Platelet count 151k   Per office protocol, patient can hold Xarelto for 2 days prior to procedure.       I will route this recommendation to the requesting party via Epic fax function and remove from pre-op pool. Please call with questions.  Tami Lin Kassadee Carawan, PA 10/24/2021, 3:14 PM

## 2021-10-24 NOTE — Patient Instructions (Addendum)
45    Your procedure is scheduled on: 10/31/2021  Report to [name] stay at   8:15  AM.  Call this number if you have problems the morning of surgery: [phone]   Remember:   Follow instructions on letter from office regarding when to stop eating and drinking        No Smoking the day of procedure      Take these medicines the morning of surgery with A SIP OF WATER: Celexa, Nexium, and gabapentin  Hold Xarelto 2 days prior to procedure   Do not wear jewelry, make-up or nail polish.  Do not wear lotions, powders, or perfumes. You may wear deodorant.                Do not bring valuables to the hospital.  Contacts, dentures or bridgework may not be worn into surgery.  Leave suitcase in the car. After surgery it may be brought to your room.  For patients admitted to the hospital, checkout time is 11:00 AM the day of discharge.   Patients discharged the day of surgery will not be allowed to drive home. Upper Endoscopy, Adult Upper endoscopy is a procedure to look inside the upper GI (gastrointestinal) tract. The upper GI tract is made up of: The part of the body that moves food from your mouth to your stomach (esophagus). The stomach. The first part of your small intestine (duodenum). This procedure is also called esophagogastroduodenoscopy (EGD) or gastroscopy. In this procedure, your health care provider passes a thin, flexible tube (endoscope) through your mouth and down your esophagus into your stomach. A small camera is attached to the end of the tube. Images from the camera appear on a monitor in the exam room. During this procedure, your health care provider may also remove a small piece of tissue to be sent to a lab and examined under a microscope (biopsy). Your health care provider may do an upper endoscopy to diagnose cancers of the upper GI tract. You may also have this procedure to find the cause of other conditions, such as: Stomach pain. Heartburn. Pain or problems  when swallowing. Nausea and vomiting. Stomach bleeding. Stomach ulcers. Tell a health care provider about: Any allergies you have. All medicines you are taking, including vitamins, herbs, eye drops, creams, and over-the-counter medicines. Any problems you or family members have had with anesthetic medicines. Any blood disorders you have. Any surgeries you have had. Any medical conditions you have. Whether you are pregnant or may be pregnant. What are the risks? Generally, this is a safe procedure. However, problems may occur, including: Infection. Bleeding. Allergic reactions to medicines. A tear or hole (perforation) in the esophagus, stomach, or duodenum. What happens before the procedure? Staying hydrated Follow instructions from your health care provider about hydration, which may include: Up to 4 hours before the procedure - you may continue to drink clear liquids, such as water, clear fruit juice, black coffee, and plain tea.   Medicines Ask your health care provider about: Changing or stopping your regular medicines. This is especially important if you are taking diabetes medicines or blood thinners. Taking medicines such as aspirin and ibuprofen. These medicines can thin your blood. Do not take these medicines unless your health care provider tells you to take them. Taking over-the-counter medicines, vitamins, herbs, and supplements. General instructions Plan to have someone take you home from the hospital or clinic. If you will be going home right after the procedure, plan to  have someone with you for 24 hours. Ask your health care provider what steps will be taken to help prevent infection. What happens during the procedure?  An IV will be inserted into one of your veins. You may be given one or more of the following: A medicine to help you relax (sedative). A medicine to numb the throat (local anesthetic). You will lie on your left side on an exam table. Your health  care provider will pass the endoscope through your mouth and down your esophagus. Your health care provider will use the scope to check the inside of your esophagus, stomach, and duodenum. Biopsies may be taken. The endoscope will be removed. The procedure may vary among health care providers and hospitals. What happens after the procedure? Your blood pressure, heart rate, breathing rate, and blood oxygen level will be monitored until you leave the hospital or clinic. Do not drive for 24 hours if you were given a sedative during your procedure. When your throat is no longer numb, you may be given some fluids to drink. It is up to you to get the results of your procedure. Ask your health care provider, or the department that is doing the procedure, when your results will be ready. Summary Upper endoscopy is a procedure to look inside the upper GI tract. During the procedure, an IV will be inserted into one of your veins. You may be given a medicine to help you relax. A medicine will be used to numb your throat. The endoscope will be passed through your mouth and down your esophagus. This information is not intended to replace advice given to you by your health care provider. Make sure you discuss any questions you have with your health care provider. Document Revised: 03/11/2018 Document Reviewed: 02/16/2018 Elsevier Patient Education  [place] After  Please read the instructions outlined below and refer to this sheet in the next few weeks. These discharge instructions provide you with general information on caring for yourself after you leave the hospital. Your doctor may also give you specific instructions. While your treatment has been planned according to the most current medical practices available, unavoidable complications  occasionally occur. If you have any problems or questions after discharge, please call your doctor. HOME CARE INSTRUCTIONS Activity You may resume your regular activity but move at a slower pace for the next 24 hours.  Take frequent rest periods for the next 24 hours.  Walking will help expel (get rid of) the air and reduce the bloated feeling in your abdomen.  No driving for 24 hours (because of the anesthesia (medicine) used during the test).  You may shower.  Do not sign any important legal documents or operate any  machinery for 24 hours (because of the anesthesia used during the test).  Nutrition Drink plenty of fluids.  You may resume your normal diet.  Begin with a light meal and progress to your normal diet.  Avoid alcoholic beverages for 24 hours or as instructed by your caregiver.  Medications You may resume your normal medications unless your caregiver tells you otherwise. What you can expect today You may experience abdominal discomfort such as a feeling of fullness or "gas" pains.  You may experience a sore throat for 2 to 3 days. This is normal. Gargling with salt water may help this.  Follow-up Your doctor will discuss the results of your test with you. SEEK IMMEDIATE MEDICAL CARE IF: You have excessive nausea (feeling sick to your stomach) and/or vomiting.  You have severe abdominal pain and distention (swelling).  You have trouble swallowing.  You have a temperature over 100 F (37.8 C).  You have rectal bleeding or vomiting of blood.  Document Released: 04/30/2004 Document Revised: 09/05/2011 Document Reviewed: 11/11/2007

## 2021-10-24 NOTE — Telephone Encounter (Signed)
Patient with diagnosis of Afib on Xarelto for anticoagulation.    Procedure: Upper endoscopy Date of procedure: 10/31/2021  CHA2DS2-VASc Score = 4  This indicates a 4.8% annual risk of stroke.The patient's score is based upon: CHF History: 0 HTN History: 1 Diabetes History: 1 Stroke History: 0 Vascular Disease History: 0 Age Score: 1 Gender Score: 1   CrCl 112 mL/min Platelet count 151k  Per office protocol, patient can hold Xarelto for 2 days prior to procedure.

## 2021-10-29 ENCOUNTER — Encounter (HOSPITAL_COMMUNITY)
Admission: RE | Admit: 2021-10-29 | Discharge: 2021-10-29 | Disposition: A | Discharge status: Home / Self Care | Payer: Medicare PPO | Source: Ambulatory Visit | Attending: Internal Medicine | Admitting: Internal Medicine

## 2021-10-30 ENCOUNTER — Encounter (HOSPITAL_COMMUNITY)
Admission: RE | Admit: 2021-10-30 | Discharge: 2021-10-30 | Disposition: A | Discharge status: Home / Self Care | Payer: Medicare PPO | Source: Ambulatory Visit | Attending: Internal Medicine | Admitting: Internal Medicine

## 2021-10-30 ENCOUNTER — Ambulatory Visit (INDEPENDENT_AMBULATORY_CARE_PROVIDER_SITE_OTHER): Payer: Medicare PPO | Admitting: Pulmonary Disease

## 2021-10-30 ENCOUNTER — Other Ambulatory Visit: Payer: Self-pay

## 2021-10-30 ENCOUNTER — Encounter: Payer: Self-pay | Admitting: Pulmonary Disease

## 2021-10-30 ENCOUNTER — Encounter (HOSPITAL_COMMUNITY): Payer: Self-pay

## 2021-10-30 VITALS — BP 110/60 | HR 78 | Temp 98.0°F | Ht 63.0 in | Wt 224.0 lb

## 2021-10-30 DIAGNOSIS — Z01812 Encounter for preprocedural laboratory examination: Secondary | ICD-10-CM | POA: Diagnosis not present

## 2021-10-30 DIAGNOSIS — Z862 Personal history of diseases of the blood and blood-forming organs and certain disorders involving the immune mechanism: Secondary | ICD-10-CM | POA: Insufficient documentation

## 2021-10-30 DIAGNOSIS — Z9989 Dependence on other enabling machines and devices: Secondary | ICD-10-CM | POA: Diagnosis not present

## 2021-10-30 DIAGNOSIS — G4733 Obstructive sleep apnea (adult) (pediatric): Secondary | ICD-10-CM

## 2021-10-30 DIAGNOSIS — K7460 Unspecified cirrhosis of liver: Secondary | ICD-10-CM | POA: Insufficient documentation

## 2021-10-30 DIAGNOSIS — K580 Irritable bowel syndrome with diarrhea: Secondary | ICD-10-CM | POA: Diagnosis not present

## 2021-10-30 DIAGNOSIS — K219 Gastro-esophageal reflux disease without esophagitis: Secondary | ICD-10-CM | POA: Insufficient documentation

## 2021-10-30 LAB — BASIC METABOLIC PANEL
Anion gap: 6 (ref 5–15)
BUN: 10 mg/dL (ref 8–23)
CO2: 24 mmol/L (ref 22–32)
Calcium: 8.6 mg/dL — ABNORMAL LOW (ref 8.9–10.3)
Chloride: 106 mmol/L (ref 98–111)
Creatinine, Ser: 0.65 mg/dL (ref 0.44–1.00)
GFR, Estimated: >60 mL/min (ref >60)
Glucose, Bld: 209 mg/dL — ABNORMAL HIGH (ref 70–99)
Potassium: 3.1 mmol/L — ABNORMAL LOW (ref 3.5–5.1)
Sodium: 136 mmol/L (ref 135–145)

## 2021-10-30 NOTE — Progress Notes (Signed)
[name], Critical Care, and Sleep Medicine  Chief Complaint  Patient presents with   Follow-up    Cpap compliance     Past Surgical History:  She  has a past surgical history that includes Abdominal hysterectomy (1980); Breast reduction surgery (1994); Rectocele repair (1990; 09/12/2006); Carpal tunnel release (Right, 1996); Knee arthroscopy (Right, 01/1999; 10/2000); Hemilaminotomy lumbar spine (Bilateral, 09/07/1999); Cholecystectomy; Flexible sigmoidoscopy (01/17/2012); Bilateral salpingoophorectomy (02/10/2001); Tarsal tunnel release (2002); Ureterolysis (Right, 02/10/2001); Lysis of adhesion (02/10/2001); Carpal tunnel release (Left, 03/21/2003); Tumor excision (Left, 03/21/2003); Esophagogastroduodenoscopy (egd) with esophageal dilation (12/02/2005); Carpal tunnel release (Right, 05/04/2013); Carpal tunnel release (Left, 09/21/2013); Bacterial overgrowth test (N/A, 07/13/2015); Esophagogastroduodenoscopy (egd) with propofol (N/A, 08/04/2015); Esophageal dilation (N/A, 08/04/2015); Colonoscopy with propofol (N/A, 08/04/2015); transthoracic echocardiogram (08/04/2015); Cardiovascular stress test (07/2010); Colonoscopy with propofol (N/A, 06/11/2019); biopsy (06/11/2019); Givens capsule study (N/A, 08/03/2019); Esophagogastroduodenoscopy (09/27/2019); Esophagogastroduodenoscopy (egd) with propofol (N/A, 09/27/2019); maloney dilation (09/27/2019); biopsy (09/27/2019); Colonoscopy with propofol (N/A, 09/15/2020); and biopsy (09/15/2020).  Past Medical History:  NASH with cirrhosis, Fibromyalgia, Anxiety, GERD, Anemia, HTN, HLD, IBS, DM type 2, PAF, Epistaxis, Ulcerative colitis  Constitutional:  BP 110/60 (BP Location: Left Wrist, Cuff Size: Normal)    Pulse 78    Temp 98 F (36.7 C) (Oral)    Ht 5' 3"  (1.6 m)    Wt 224 lb (101.6 kg)    SpO2 96%    BMI 39.68 kg/m   Brief Summary:  [name] is a 76 y.o. female with obstructive sleep apnea.      Subjective:   I last saw her in 2018.  More  recently saw [name].  She was seen recently by ENT for epistaxis.  She learned about Inspire device.  She was advised to have f/u with sleep medicine and repeat sleep study to determine if she might be a candidate for this if she got her weight down.  Her CPAP machine is making a whistling noise.    Uses CPAP nightly.  No issues with mask fit.  Physical Exam:   Appearance - well kempt   ENMT - no sinus tenderness, no oral exudate, no LAN, Mallampati 3 airway, no stridor, deviated septum  Respiratory - equal breath sounds bilaterally, no wheezing or rales  CV - s1s2 regular rate and rhythm, no murmurs  Ext - no clubbing, no edema  Skin - no rashes  Psych - normal mood and affect   Sleep Tests:  PSG 09/07/15 >> AHI 72, SpO2 low 75% Auto CPAP 11/29/20 to 12/28/20 >> used on 27 of 30 nights with average 5 hrs 57 min.  Average AHI 3.7 with median CPAP 8 and 95 th percentile CPAP 12 cm H2O  Cardiac Tests:  Echo 08/04/15 >> mod LVH, EF 60 to 08%, grade 1 diastolic CHF  Social History:  She  reports that she has never smoked. She has never used smokeless tobacco. She reports that she does not currently use alcohol. She reports that she does not use drugs.  Family History:  Her family history includes Alcohol abuse in an other family member; Asthma in her maternal grandfather; Colon cancer in her paternal grandfather; Diabetes in her mother and son; Emphysema in her maternal grandfather; Heart attack in her father; Heart disease in her father; Hypertension in her mother and son; Lung disease in her father; Stomach cancer in her paternal grandmother.     Assessment/Plan:   Obstructive sleep apnea. - she is compliant with CPAP and reports benefit from therapy -  uses [employer] for her DME - her machine is more than 75 years old - will arrange for new [employer] auto CPAP 5 to 15 cm H2O  Ulcerative colitis, NASH with cirrhosis. - followed by Dr. [name] with  Gastroenterology  Recurrent epistaxis, deviated nasal septum. - followed by Dr. Jerrell Belfast with ENT  Time Spent Involved in Patient Care on Day of Examination:  27 minutes  Follow up:   Patient Instructions  Will arrange for new auto CPAP machine  Follow up in [place] office in 5 months  Medication List:   Allergies as of 10/30/2021       Reactions   Omeprazole Anaphylaxis, Swelling   SWELLING OF TONGUE AND THROAT   Actos [pioglitazone] Other (See Comments)   Weight gain   Benzocaine-menthol Swelling   SWELLING OF MOUTH   Colesevelam Other (See Comments)   GI UPSET   Flagyl [metronidazole Hcl] Other (See Comments)   DIAPHORESIS   Metformin And Related Diarrhea   Shrimp [shellfish Allergy] Itching   OF THROAT AND EARS   Statins Palpitations   Allevyn Adhesive [wound Dressings]    Other reaction(s): Unknown   Desipramine Hcl Itching, Nausea Only, Other (See Comments)   "swimmy" headed, ears itched   Hydromorphone Itching   Jardiance [empagliflozin] Other (See Comments)   weakness   Lactose Intolerance (gi) Diarrhea   Gas, bloating   Adhesive [tape] Other (See Comments)   SKIN IRRITATION AND BRUISING   Nisoldipine Itching   Percocet [oxycodone-acetaminophen] Itching        Medication List        Accurate as of October 30, 2021  1:48 PM. If you have any questions, ask your nurse or doctor.          Accu-Chek Guide test strip Generic drug: glucose blood USE TO CHECK BLOOD GLUCOSE UP TO 6 TIMES DAILY AS DIRECTED   Accu-Chek Softclix Lancets lancets USE TO CHECK BLOOD GLUCOSE UP TO 6 TIMES DAILY AS DIRECTED   acetaminophen 325 MG tablet Commonly known as: TYLENOL Take 650 mg by mouth every 6 (six) hours as needed for moderate pain or headache.   B-D UF III MINI PEN NEEDLES 31G X 5 MM Misc Generic drug: Insulin Pen Needle USE TO INJECT INSULINS EQUAL TO 6 TIMES DAILY.   blood glucose meter kit and supplies Kit Use up to six times daily as  directed. DX. E11.9   canagliflozin 100 MG Tabs tablet Commonly known as: Invokana Take 1 tablet (100 mg total) by mouth daily before breakfast.   citalopram 20 MG tablet Commonly known as: CELEXA TAKE 1 TABLET BY MOUTH EVERY DAY   dicyclomine 10 MG capsule Commonly known as: BENTYL Take 10 mg by mouth 3 (three) times daily before meals.   esomeprazole 20 MG capsule Commonly known as: NEXIUM TAKE 1 CAPSULE BY MOUTH DAILY BEFORE BREAKFAST   Flintstones Complete 18 MG Chew Chew 1 tablet by mouth 2 (two) times daily.   furosemide 40 MG tablet Commonly known as: LASIX TAKE 1 TABLET BY MOUTH EVERY DAY   gabapentin 600 MG tablet Commonly known as: NEURONTIN TAKE 1 AND 1/2 TABLETS BY MOUTH TWICE A DAY   HumuLIN R U-500 KwikPen 500 UNIT/ML KwikPen Generic drug: insulin regular human CONCENTRATED Inject 125-135 Units into the skin See admin instructions. Inject 110 units with breakfast, 110 units with dinner, and 50 U q evening prn hyperglycemia   inFLIXimab 100 MG injection Commonly known as: REMICADE Inject 100 mg into the  vein every 8 (eight) weeks.   Injectafer 750 MG/15ML Soln injection Generic drug: ferric carboxymaltose Inject 15 mLs (750 mg total) into the vein as directed for 1 dose. Repeat in 7 days   lactase 3000 units tablet Commonly known as: LACTAID Take 3,000 Units by mouth as needed (when eating foods containing dairy).   loperamide 2 MG capsule Commonly known as: IMODIUM Take 1 capsule (2 mg total) by mouth 2 (two) times daily as needed for diarrhea or loose stools.   meclizine 25 MG tablet Commonly known as: ANTIVERT Take 1 tablet (25 mg total) by mouth 3 (three) times daily as needed for dizziness or nausea.   metoprolol succinate 100 MG 24 hr tablet Commonly known as: TOPROL-XL TAKE 1 TABLET BY MOUTH 2 TIMES DAILY. TAKE WITH OR IMMEDIATELY FOLLOWING A MEAL   OVER THE COUNTER MEDICATION Iron 2 daily   potassium chloride 10 MEQ tablet Commonly  known as: Klor-Con M10 Take 1 tablet (10 mEq total) by mouth daily.   Trulicity 1.5 EL/9.5VU Sopn Generic drug: Dulaglutide INJECT 1.5MG ONCE WEEKLY   Xarelto 20 MG Tabs tablet Generic drug: rivaroxaban TAKE 1 TABLET BY MOUTH DAILY WITH SUPPER        Signature:  Chesley Mires, MD [place] Pager - [phone] 10/30/2021, 1:48 PM

## 2021-10-30 NOTE — Patient Instructions (Signed)
Will arrange for new auto CPAP machine  Follow up in [place] office in 5 months

## 2021-10-31 ENCOUNTER — Encounter (HOSPITAL_COMMUNITY): Payer: Self-pay | Admitting: Internal Medicine

## 2021-10-31 ENCOUNTER — Other Ambulatory Visit: Payer: Self-pay

## 2021-10-31 ENCOUNTER — Ambulatory Visit (HOSPITAL_COMMUNITY): Payer: Medicare PPO | Admitting: Anesthesiology

## 2021-10-31 ENCOUNTER — Ambulatory Visit (HOSPITAL_COMMUNITY)
Admission: RE | Admit: 2021-10-31 | Discharge: 2021-10-31 | Disposition: A | Discharge status: Home / Self Care | Payer: Medicare PPO | Attending: Internal Medicine | Admitting: Internal Medicine

## 2021-10-31 ENCOUNTER — Encounter (HOSPITAL_COMMUNITY)
Admission: RE | Disposition: A | Discharge status: Home / Self Care | Payer: Self-pay | Source: Home / Self Care | Attending: Internal Medicine

## 2021-10-31 DIAGNOSIS — G4733 Obstructive sleep apnea (adult) (pediatric): Secondary | ICD-10-CM | POA: Insufficient documentation

## 2021-10-31 DIAGNOSIS — Z794 Long term (current) use of insulin: Secondary | ICD-10-CM | POA: Insufficient documentation

## 2021-10-31 DIAGNOSIS — K219 Gastro-esophageal reflux disease without esophagitis: Secondary | ICD-10-CM | POA: Diagnosis not present

## 2021-10-31 DIAGNOSIS — K766 Portal hypertension: Secondary | ICD-10-CM | POA: Diagnosis not present

## 2021-10-31 DIAGNOSIS — M797 Fibromyalgia: Secondary | ICD-10-CM | POA: Diagnosis not present

## 2021-10-31 DIAGNOSIS — Z7984 Long term (current) use of oral hypoglycemic drugs: Secondary | ICD-10-CM | POA: Diagnosis not present

## 2021-10-31 DIAGNOSIS — E119 Type 2 diabetes mellitus without complications: Secondary | ICD-10-CM | POA: Diagnosis not present

## 2021-10-31 DIAGNOSIS — F419 Anxiety disorder, unspecified: Secondary | ICD-10-CM | POA: Insufficient documentation

## 2021-10-31 DIAGNOSIS — I10 Essential (primary) hypertension: Secondary | ICD-10-CM | POA: Diagnosis not present

## 2021-10-31 DIAGNOSIS — D649 Anemia, unspecified: Secondary | ICD-10-CM | POA: Diagnosis not present

## 2021-10-31 DIAGNOSIS — Z8711 Personal history of peptic ulcer disease: Secondary | ICD-10-CM | POA: Insufficient documentation

## 2021-10-31 DIAGNOSIS — Z79899 Other long term (current) drug therapy: Secondary | ICD-10-CM | POA: Diagnosis not present

## 2021-10-31 DIAGNOSIS — K7460 Unspecified cirrhosis of liver: Secondary | ICD-10-CM | POA: Insufficient documentation

## 2021-10-31 DIAGNOSIS — K7581 Nonalcoholic steatohepatitis (NASH): Secondary | ICD-10-CM | POA: Insufficient documentation

## 2021-10-31 DIAGNOSIS — K3189 Other diseases of stomach and duodenum: Secondary | ICD-10-CM | POA: Insufficient documentation

## 2021-10-31 DIAGNOSIS — I4891 Unspecified atrial fibrillation: Secondary | ICD-10-CM | POA: Insufficient documentation

## 2021-10-31 DIAGNOSIS — Z862 Personal history of diseases of the blood and blood-forming organs and certain disorders involving the immune mechanism: Secondary | ICD-10-CM

## 2021-10-31 DIAGNOSIS — K580 Irritable bowel syndrome with diarrhea: Secondary | ICD-10-CM

## 2021-10-31 DIAGNOSIS — Z13818 Encounter for screening for other digestive system disorders: Secondary | ICD-10-CM | POA: Diagnosis not present

## 2021-10-31 HISTORY — PX: ESOPHAGOGASTRODUODENOSCOPY (EGD) WITH PROPOFOL: SHX5813

## 2021-10-31 LAB — GLUCOSE, CAPILLARY: Glucose-Capillary: 142 mg/dL — ABNORMAL HIGH (ref 70–99)

## 2021-10-31 SURGERY — ESOPHAGOGASTRODUODENOSCOPY (EGD) WITH PROPOFOL
Anesthesia: General

## 2021-10-31 MED ORDER — PROPOFOL 500 MG/50ML IV EMUL
INTRAVENOUS | Status: DC | PRN
  Administered 2021-10-31: 200 ug/kg/min via INTRAVENOUS

## 2021-10-31 MED ORDER — LACTATED RINGERS IV SOLN: INTRAVENOUS | Status: DC

## 2021-10-31 MED ORDER — PROPOFOL 10 MG/ML IV BOLUS
INTRAVENOUS | Status: DC | PRN
  Administered 2021-10-31: 20 mg via INTRAVENOUS
  Administered 2021-10-31: 50 mg via INTRAVENOUS

## 2021-10-31 MED ORDER — LIDOCAINE 2% (20 MG/ML) 5 ML SYRINGE
INTRAMUSCULAR | Status: DC | PRN
  Administered 2021-10-31: 50 mg via INTRAVENOUS

## 2021-10-31 NOTE — Discharge Instructions (Signed)
Resume usual medications including Xarelto as before. Resume usual diet. No driving for 24 hours. Consider esophagogastroduodenoscopy in 1 year.

## 2021-10-31 NOTE — H&P (Addendum)
[name] is an 76 y.o. female.   Chief Complaint: Patient is here for esophagogastroduodenoscopy. HPI: Patient is 76 year old Caucasian female with multiple problems including cirrhosis secondary to NASH who is undergoing esophagogastroduodenoscopy to screen for varices.  If she has moderate to large varices these will be banded.  Patient has chronic GERD.  She says heartburn is well controlled with diet and therapy.  She denies dysphagia melena or rectal bleeding. Last EGD was in December 2020 for dysphagia and iron deficiency anemia and she did not have esophageal or gastric varices. Xarelto is on hold.  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 ? to get liver bx if she gets bariatric surgery? Mild portal hypertensive gastropathy on EGD 08/2019.   Fibromyalgia    GAD (generalized anxiety disorder)    GERD    History of iron deficiency anemia 12/2018   Possibly inadequate absorption secondary to chronic/long term PPI therapy + portal hypertensive gastroduodenopathy. No GIB found on EGD, colonosc, and givens. Iron infusion approx 02/2021. [facility].   History of thrombocytopenia 12/2011   Hyperlipidemia    Intolerant of statins   HYPERTENSION    IBS (irritable bowel syndrome)    -D.  Good response to bentyl and imodium as of 06/2018 GI f/u.   IDDM (insulin dependent diabetes mellitus)    with DPN (managed by Dr. Cruzita Lederer but then in 2018 pt preferred to have me manage for her convenience)   Limited mobility    Requires a walker for arthritic pain, widespread musculoskeletal pain, and neuropathic pain.   Morbid obesity ([place])    As of 11/2018, pt considering sleeve gastectomy vs bipass as of eval by Dr. De Burrs considering as of 12/2019.   Nonalcoholic steatohepatitis    Viral Hep screens NEG.  CT 2015.  Transaminasemia.  U/S  07/2018 showed early changes of cirrhosis.   OSA (obstructive sleep apnea) 09/14/2015   sleep study 09/07/15: severe obstructive sleep apnea with an AHI of 72 and SaO2 low of 75%.>refer to sleep med for eval and tx options   Osteoarthritis    hips, shoulders, knees   PAF (paroxysmal atrial fibrillation) ([place])    One documented episode (after getting EGD 2016).  Was on amiodarone x 3 mo.  Rate control with metoprolol + anticoag with xarelto.    Recurrent epistaxis    Granuloma in L nare cauterized by ENT 04/2020. Another cautery 06/2020   Small fiber neuropathy    Due to DM.  Symmetric hands and feet tingling/numbness.   Ulcerative colitis ([place])    Remicade infusion Q 8 weeks: in clinical and endoscopic remission as of 12/2018 GI f/u.  06/11/19 rpt colonoscopy->cecal and ascending colon colitis.    Past Surgical History:  Procedure Laterality Date   ABDOMINAL HYSTERECTOMY  1980   Paps no longer indicated.   BACTERIAL OVERGROWTH TEST N/A 07/13/2015   Procedure: BACTERIAL OVERGROWTH TEST;  Surgeon: Rogene Houston, MD;  [facility];  Service: Endoscopy;  Laterality: N/A;  730     BILATERAL SALPINGOOPHORECTOMY  02/10/2001   BIOPSY  06/11/2019   Procedure: BIOPSY;  Surgeon: Rogene Houston, MD;  [facility];  Service: Endoscopy;;  colon   BIOPSY  09/27/2019   Procedure: BIOPSY;  Surgeon: Rogene Houston, MD;  [facility];  Service: Endoscopy;;  gastric duodenal  BIOPSY  09/15/2020   Procedure: BIOPSY;  Surgeon: Harvel Quale, MD;  [facility];  Service: Gastroenterology;;   BREAST REDUCTION SURGERY  1994   bilat   CARDIOVASCULAR STRESS TEST  07/2010   Lexiscan myoview: normal   CARPAL TUNNEL RELEASE Right 1996   CARPAL TUNNEL RELEASE Left 03/21/2003   CARPAL TUNNEL RELEASE Right 05/04/2013   Procedure: CARPAL TUNNEL RELEASE;  Surgeon: Cammie Sickle., MD;  Location: [place];  Service: Orthopedics;   Laterality: Right;   CARPAL TUNNEL RELEASE Left 09/21/2013   Procedure: LEFT CARPAL TUNNEL RELEASE;  Surgeon: Cammie Sickle., MD;  Location: [place];  Service: Orthopedics;  Laterality: Left;   CHOLECYSTECTOMY     COLONOSCOPY WITH PROPOFOL N/A 08/04/2015   Colitis in remission.  No polyps.  Procedure: COLONOSCOPY WITH PROPOFOL;  Surgeon: Rogene Houston, MD;  [facility]: [facility];  Service: Endoscopy;  Laterality: N/A;  cecum time in  0820   time out  0827    total time 7 minutes   COLONOSCOPY WITH PROPOFOL N/A 06/11/2019   cecal and ascending colon colitis.  Procedure: COLONOSCOPY WITH PROPOFOL;  Surgeon: Rogene Houston, MD;  [facility];  Service: Endoscopy;  Laterality: N/A;  730a   COLONOSCOPY WITH PROPOFOL N/A 09/15/2020   Procedure: COLONOSCOPY WITH PROPOFOL;  Surgeon: Harvel Quale, MD;  [facility];  Service: Gastroenterology;  Laterality: N/A;  1030   ESOPHAGEAL DILATION N/A 08/04/2015   Procedure: ESOPHAGEAL DILATION;  Surgeon: Rogene Houston, MD;  [facility];  Service: Endoscopy;  Laterality: N/AKelvin Cellar, no mucousal disruption   ESOPHAGOGASTRODUODENOSCOPY  09/27/2019   Performed for IDA.  Esoph dilation was done but no stricture present.  Mild portal hypertensive gastropathy, o/w normal.  Duodenal bx NEG.  h pylori neg.   ESOPHAGOGASTRODUODENOSCOPY (EGD) WITH ESOPHAGEAL DILATION  12/02/2005   ESOPHAGOGASTRODUODENOSCOPY (EGD) WITH PROPOFOL N/A 08/04/2015   Procedure: ESOPHAGOGASTRODUODENOSCOPY (EGD) WITH PROPOFOL;  Surgeon: Rogene Houston, MD;  [facility];  Service: Endoscopy;  Laterality: N/A;  procedure 1   ESOPHAGOGASTRODUODENOSCOPY (EGD) WITH PROPOFOL N/A 09/27/2019   Procedure: ESOPHAGOGASTRODUODENOSCOPY (EGD) WITH PROPOFOL;  Surgeon: Rogene Houston, MD;  [facility];  Service: Endoscopy;  Laterality: N/A;  12:10   FLEXIBLE SIGMOIDOSCOPY  01/17/2012   Procedure: FLEXIBLE SIGMOIDOSCOPY;  Surgeon:  Rogene Houston, MD;  [facility];  Service: Endoscopy;  Laterality: N/A;   GIVENS CAPSULE STUDY N/A 08/03/2019   Procedure: GIVENS CAPSULE STUDY (performed for IDA)->some food debris in stomach and small amount of blood.  Surgeon: Rogene Houston, MD;  [facility];  Service: Endoscopy;  Laterality: N/A;  730AM   HEMILAMINOTOMY LUMBAR SPINE Bilateral 09/07/1999   L4-5   KNEE ARTHROSCOPY Right 01/1999; 10/2000   LYSIS OF ADHESION  02/10/2001   MALONEY DILATION  09/27/2019   Procedure: Venia Minks DILATION;  Surgeon: Rogene Houston, MD;  [facility];  Service: Endoscopy;;   [place]; 09/12/2006   TARSAL TUNNEL RELEASE  2002   TRANSTHORACIC ECHOCARDIOGRAM  08/04/2015   EF 60-65%, normal wall motion, mild LVH, mild LA dilation, grd I DD.   TUMOR EXCISION Left 03/21/2003   dorsal 1st web space (hand)   URETEROLYSIS Right 02/10/2001    Family History  Problem Relation Age of Onset   Diabetes Mother    Hypertension Mother    Heart attack Father  Mid 22's   Heart disease Father    Lung disease Father        spot on lung; had lung surgery   Alcohol abuse Other    Hypertension Son    Diabetes Son    Emphysema Maternal Grandfather    Asthma Maternal Grandfather    Colon cancer Paternal Grandfather    Stomach cancer Paternal Grandmother    Neuropathy Neg Hx    Social History:  reports that she has never smoked. She has never used smokeless tobacco. She reports that she does not currently use alcohol. She reports that she does not use drugs.  Allergies:  Allergies  Allergen Reactions   Omeprazole Anaphylaxis and Swelling    SWELLING OF TONGUE AND THROAT   Actos [Pioglitazone] Other (See Comments)    Weight gain   Benzocaine-Menthol Swelling    SWELLING OF MOUTH   Colesevelam Other (See Comments)    GI UPSET   Flagyl [Metronidazole Hcl] Other (See Comments)    DIAPHORESIS   Metformin And Related Diarrhea   Shrimp [Shellfish  Allergy] Itching    OF THROAT AND EARS   Statins Palpitations   Allevyn Adhesive [Wound Dressings]     Other reaction(s): Unknown   Desipramine Hcl Itching, Nausea Only and Other (See Comments)    "swimmy" headed, ears itched    Hydromorphone Itching   Jardiance [Empagliflozin] Other (See Comments)    weakness   Lactose Intolerance (Gi) Diarrhea    Gas, bloating   Adhesive [Tape] Other (See Comments)    SKIN IRRITATION AND BRUISING   Nisoldipine Itching   Percocet [Oxycodone-Acetaminophen] Itching    Medications Prior to Admission  Medication Sig Dispense Refill   acetaminophen (TYLENOL) 325 MG tablet Take 650 mg by mouth every 6 (six) hours as needed for moderate pain or headache.     canagliflozin (INVOKANA) 100 MG TABS tablet Take 1 tablet (100 mg total) by mouth daily before breakfast. 90 tablet 3   citalopram (CELEXA) 20 MG tablet TAKE 1 TABLET BY MOUTH EVERY DAY 90 tablet 3   dicyclomine (BENTYL) 10 MG capsule Take 10 mg by mouth 3 (three) times daily before meals.     Dulaglutide (TRULICITY) 1.5 TW/6.5KC SOPN INJECT 1.5MG ONCE WEEKLY 18 mL 3   esomeprazole (NEXIUM) 20 MG capsule TAKE 1 CAPSULE BY MOUTH DAILY BEFORE BREAKFAST 90 capsule 3   furosemide (LASIX) 40 MG tablet TAKE 1 TABLET BY MOUTH EVERY DAY 90 tablet 1   gabapentin (NEURONTIN) 600 MG tablet TAKE 1 AND 1/2 TABLETS BY MOUTH TWICE A DAY 270 tablet 3   inFLIXimab (REMICADE) 100 MG injection Inject 100 mg into the vein every 8 (eight) weeks.      insulin regular human CONCENTRATED (HUMULIN R U-500 KWIKPEN) 500 UNIT/ML KwikPen Inject 125-135 Units into the skin See admin instructions. Inject 110 units with breakfast, 110 units with dinner, and 50 U q evening prn hyperglycemia 45 mL 3   lactase (LACTAID) 3000 UNITS tablet Take 3,000 Units by mouth as needed (when eating foods containing dairy).      loperamide (IMODIUM) 2 MG capsule Take 1 capsule (2 mg total) by mouth 2 (two) times daily as needed for diarrhea or loose  stools. 90 capsule 5   meclizine (ANTIVERT) 25 MG tablet Take 1 tablet (25 mg total) by mouth 3 (three) times daily as needed for dizziness or nausea. 90 tablet 0   metoprolol succinate (TOPROL-XL) 100 MG 24 hr tablet TAKE 1 TABLET BY MOUTH  2 TIMES DAILY. TAKE WITH OR IMMEDIATELY FOLLOWING A MEAL 180 tablet 3   OVER THE COUNTER MEDICATION Iron 2 daily     Pediatric Multivitamins-Iron (FLINTSTONES COMPLETE) 18 MG CHEW Chew 1 tablet by mouth 2 (two) times daily.     potassium chloride (KLOR-CON M10) 10 MEQ tablet Take 1 tablet (10 mEq total) by mouth daily. 90 tablet 3   XARELTO 20 MG TABS tablet TAKE 1 TABLET BY MOUTH DAILY WITH SUPPER 90 tablet 1   ACCU-CHEK GUIDE test strip USE TO CHECK BLOOD GLUCOSE UP TO 6 TIMES DAILY AS DIRECTED 200 strip 1   Accu-Chek Softclix Lancets lancets USE TO CHECK BLOOD GLUCOSE UP TO 6 TIMES DAILY AS DIRECTED 200 each 1   B-D UF III MINI PEN NEEDLES 31G X 5 MM MISC USE TO INJECT INSULINS EQUAL TO 6 TIMES DAILY. 600 each 5   blood glucose meter kit and supplies KIT Use up to six times daily as directed. DX. E11.9 1 each 0   ferric carboxymaltose (INJECTAFER) 750 MG/15ML SOLN injection Inject 15 mLs (750 mg total) into the vein as directed for 1 dose. Repeat in 7 days 15 mL 1    Results for orders placed or performed during the hospital encounter of 10/31/21 (from the past 48 hour(s))  Glucose, capillary     Status: Abnormal   Collection Time: 10/31/21  8:20 AM  Result Value Ref Range   Glucose-Capillary 142 (H) 70 - 99 mg/dL    Comment: Glucose reference range applies only to samples taken after fasting for at least 8 hours.   *Note: Due to a large number of results and/or encounters for the requested time period, some results have not been displayed. A complete set of results can be found in Results Review.   No results found.  Review of Systems  Blood pressure (!) 144/52, pulse 71, temperature 98.4 F (36.9 C), temperature source Oral, resp. rate 16, SpO2 98  %. Physical Exam HENT:     Mouth/Throat:     Mouth: Mucous membranes are moist.     Pharynx: Oropharynx is clear.  Eyes:     General: No scleral icterus.    Conjunctiva/sclera: Conjunctivae normal.  Cardiovascular:     Rate and Rhythm: Normal rate and regular rhythm.     Heart sounds: Normal heart sounds. No murmur heard. Pulmonary:     Effort: Pulmonary effort is normal.     Breath sounds: Normal breath sounds.  Abdominal:     General: There is no distension.     Palpations: Abdomen is soft. There is no mass.     Tenderness: There is no abdominal tenderness.  Musculoskeletal:        General: No swelling.     Cervical back: Neck supple.  Lymphadenopathy:     Cervical: No cervical adenopathy.  Skin:    General: Skin is warm and dry.  Neurological:     Mental Status: She is alert.     Assessment/Plan  Cirrhosis secondary to nonalcoholic steatohepatitis. Esophagogastroduodenoscopy to assess for esophageal varices.  [name], MD 10/31/2021, 9:09 AM

## 2021-10-31 NOTE — Op Note (Signed)
[facility] Patient Name: [name] Procedure Date: 10/31/2021 8:49 AM MRN: [id number] Date of Birth: [birth year] Attending MD: [name] , MD CSN: [id number] Age: 76 Admit Type: Outpatient Procedure:                Upper GI endoscopy Indications:              Cirrhosis rule out esophageal varices Providers:                [name], MD, Hughie Closs RN, RN, Caprice Kluver,                            Aram Candela Referring MD:             Shawnie Dapper the bed he is got so many other                            things happening to him, MD Medicines:                Propofol per Anesthesia Complications:            No immediate complications. Estimated Blood Loss:     Estimated blood loss: none. Procedure:                Pre-Anesthesia Assessment:                           - Prior to the procedure, a History and Physical                            was performed, and patient medications and                            allergies were reviewed. The patient's tolerance of                            previous anesthesia was also reviewed. The risks                            and benefits of the procedure and the sedation                            options and risks were discussed with the patient.                            All questions were answered, and informed consent                            was obtained. Prior Anticoagulants: The patient                            last took Xarelto (rivaroxaban) 3 days prior to the                            procedure. ASA Grade Assessment: III - A patient  with severe systemic disease. After reviewing the                            risks and benefits, the patient was deemed in                            satisfactory condition to undergo the procedure.                           After obtaining informed consent, the endoscope was                            passed under direct vision. Throughout the                             procedure, the patient's blood pressure, pulse, and                            oxygen saturations were monitored continuously. The                            GIF-H190 ([id number]) scope was introduced through the                            mouth, and advanced to the second part of duodenum.                            The upper GI endoscopy was accomplished without                            difficulty. The patient tolerated the procedure                            well. Scope In: 9:16:01 AM Scope Out: 9:20:18 AM Total Procedure Duration: 0 hours 4 minutes 17 seconds  Findings:      The hypopharynx was normal.      The examined esophagus was normal.      The Z-line was regular and was found 38 cm from the incisors.      Moderate portal hypertensive gastropathy was found in the gastric fundus       and in the gastric body.      The exam of the stomach was otherwise normal.      The duodenal bulb and second portion of the duodenum were normal. Impression:               - Normal hypopharynx.                           - Normal esophagus.                           - Z-line regular, 38 cm from the incisors.                           - Portal hypertensive gastropathy.                           -  Normal duodenal bulb and second portion of the                            duodenum.                           - No specimens collected. Moderate Sedation:      Per Anesthesia Care Recommendation:           - Patient has a contact number available for                            emergencies. The signs and symptoms of potential                            delayed complications were discussed with the                            patient. Return to normal activities tomorrow.                            Written discharge instructions were provided to the                            patient.                           - Resume previous diet today.                           - Continue present  medications.                           - Resume Xarelto (rivaroxaban) at prior dose today.                           - Repeat upper endoscopy in 1 year. Procedure Code(s):        --- Professional ---                           [phone], Esophagogastroduodenoscopy, flexible,                            transoral; diagnostic, including collection of                            specimen(s) by brushing or washing, when performed                            (separate procedure) Diagnosis Code(s):        --- Professional ---                           K76.6, Portal hypertension                           K31.89, Other diseases of stomach and duodenum  K74.60, Unspecified cirrhosis of liver CPT copyright 2019 [employer]. All rights reserved. The codes documented in this report are preliminary and upon coder review may  be revised to meet current compliance requirements. [name], MD [name], MD 10/31/2021 9:30:20 AM This report has been signed electronically. Number of Addenda: 0

## 2021-10-31 NOTE — Anesthesia Preprocedure Evaluation (Addendum)
Anesthesia Evaluation  Patient identified by MRN, date of birth, ID band Patient awake    Reviewed: Allergy & Precautions, NPO status , Patient's Chart, lab work & pertinent test results  Airway Mallampati: II  TM Distance: >3 FB Neck ROM: Full    Dental  (+) Dental Advisory Given, Teeth Intact   Pulmonary sleep apnea and Continuous Positive Airway Pressure Ventilation ,    Pulmonary exam normal breath sounds clear to auscultation       Cardiovascular Exercise Tolerance: Good hypertension, Pt. on medications Normal cardiovascular exam+ dysrhythmias Atrial Fibrillation  Rhythm:Regular Rate:Normal     Neuro/Psych PSYCHIATRIC DISORDERS Anxiety  Neuromuscular disease    GI/Hepatic PUD, GERD  Medicated,(+) Cirrhosis       , Hepatitis -  Endo/Other  diabetes, Well Controlled, Type 2, Oral Hypoglycemic Agents, Insulin Dependent  Renal/GU      Musculoskeletal  (+) Arthritis , Fibromyalgia -  Abdominal   Peds  Hematology  (+) Blood dyscrasia, anemia ,   Anesthesia Other Findings   Reproductive/Obstetrics                            Anesthesia Physical Anesthesia Plan  ASA: 3  Anesthesia Plan: General   Post-op Pain Management: Minimal or no pain anticipated   Induction:   PONV Risk Score and Plan: TIVA  Airway Management Planned: Nasal Cannula and Natural Airway  Additional Equipment:   Intra-op Plan:   Post-operative Plan:   Informed Consent: I have reviewed the patients History and Physical, chart, labs and discussed the procedure including the risks, benefits and alternatives for the proposed anesthesia with the patient or authorized representative who has indicated his/her understanding and acceptance.     Dental advisory given  Plan Discussed with: CRNA and Surgeon  Anesthesia Plan Comments:         Anesthesia Quick Evaluation

## 2021-10-31 NOTE — Anesthesia Postprocedure Evaluation (Signed)
Anesthesia Post Note  Patient: VALDA CHRISTENSON  Procedure(s) Performed: ESOPHAGOGASTRODUODENOSCOPY (EGD) WITH PROPOFOL  Patient location during evaluation: Phase II Anesthesia Type: General Level of consciousness: awake and alert and oriented Pain management: pain level controlled Vital Signs Assessment: post-procedure vital signs reviewed and stable Respiratory status: spontaneous breathing, nonlabored ventilation and respiratory function stable Cardiovascular status: blood pressure returned to baseline and stable Postop Assessment: no apparent nausea or vomiting Anesthetic complications: no   No notable events documented.   Last Vitals:  Vitals:   10/31/21 0823 10/31/21 0925  BP: (!) 144/52 (!) 103/41  Pulse: 71 71  Resp: 16 16  Temp: 36.9 C 36.8 C  SpO2: 98% 94%    Last Pain:  Vitals:   10/31/21 0925  TempSrc: Axillary  PainSc: 0-No pain                 Morgan Rennert C Atia Haupt

## 2021-10-31 NOTE — Transfer of Care (Signed)
Immediate Anesthesia Transfer of Care Note  Patient: [name]  Procedure(s) Performed: ESOPHAGOGASTRODUODENOSCOPY (EGD) WITH PROPOFOL  Patient Location: Short Stay  Anesthesia Type:MAC  Level of Consciousness: awake, alert , oriented and patient cooperative  Airway & Oxygen Therapy: Patient Spontanous Breathing and Patient connected to nasal cannula oxygen  Post-op Assessment: Report given to RN, Post -op Vital signs reviewed and stable and Patient moving all extremities  Post vital signs: Reviewed and stable  Last Vitals:  Vitals Value Taken Time  BP    Temp    Pulse    Resp    SpO2      Last Pain:  Vitals:   10/31/21 0912  TempSrc:   PainSc: 0-No pain         Complications: No notable events documented.

## 2021-11-01 ENCOUNTER — Other Ambulatory Visit (INDEPENDENT_AMBULATORY_CARE_PROVIDER_SITE_OTHER): Payer: Self-pay | Admitting: Internal Medicine

## 2021-11-01 MED ORDER — POTASSIUM CHLORIDE CRYS ER 10 MEQ PO TBCR: 10.0000 meq | EXTENDED_RELEASE_TABLET | Freq: Every day | ORAL | 0 refills | Status: DC

## 2021-11-04 LAB — CALPROTECTIN: Calprotectin: 132 mcg/g — ABNORMAL HIGH

## 2021-11-05 ENCOUNTER — Encounter (HOSPITAL_COMMUNITY): Payer: Self-pay | Admitting: Internal Medicine

## 2021-11-08 ENCOUNTER — Telehealth (INDEPENDENT_AMBULATORY_CARE_PROVIDER_SITE_OTHER): Payer: Self-pay

## 2021-11-08 NOTE — Telephone Encounter (Signed)
Nexium Name brand approved 11/07/2021-09/29/2022.

## 2021-11-12 ENCOUNTER — Ambulatory Visit (HOSPITAL_COMMUNITY): Payer: Medicare PPO

## 2021-11-12 ENCOUNTER — Ambulatory Visit (HOSPITAL_COMMUNITY): Admission: RE | Admit: 2021-11-12 | Payer: Medicare PPO | Source: Ambulatory Visit

## 2021-11-13 DIAGNOSIS — J343 Hypertrophy of nasal turbinates: Secondary | ICD-10-CM | POA: Diagnosis not present

## 2021-11-13 DIAGNOSIS — R040 Epistaxis: Secondary | ICD-10-CM | POA: Diagnosis not present

## 2021-11-13 DIAGNOSIS — G4733 Obstructive sleep apnea (adult) (pediatric): Secondary | ICD-10-CM | POA: Diagnosis not present

## 2021-11-13 DIAGNOSIS — J342 Deviated nasal septum: Secondary | ICD-10-CM | POA: Diagnosis not present

## 2021-11-15 DIAGNOSIS — K5180 Other ulcerative colitis without complications: Secondary | ICD-10-CM | POA: Diagnosis not present

## 2021-11-21 ENCOUNTER — Ambulatory Visit: Payer: Medicare PPO | Admitting: Family Medicine

## 2021-11-22 ENCOUNTER — Other Ambulatory Visit: Payer: Self-pay

## 2021-11-22 ENCOUNTER — Ambulatory Visit: Payer: Medicare PPO | Admitting: Family Medicine

## 2021-11-22 ENCOUNTER — Encounter: Payer: Self-pay | Admitting: Family Medicine

## 2021-11-22 VITALS — BP 120/67 | HR 44 | Temp 97.3°F | Ht 60.0 in | Wt 227.0 lb

## 2021-11-22 DIAGNOSIS — I10 Essential (primary) hypertension: Secondary | ICD-10-CM

## 2021-11-22 DIAGNOSIS — E7800 Pure hypercholesterolemia, unspecified: Secondary | ICD-10-CM

## 2021-11-22 DIAGNOSIS — E039 Hypothyroidism, unspecified: Secondary | ICD-10-CM

## 2021-11-22 DIAGNOSIS — E876 Hypokalemia: Secondary | ICD-10-CM | POA: Diagnosis not present

## 2021-11-22 DIAGNOSIS — Z794 Long term (current) use of insulin: Secondary | ICD-10-CM

## 2021-11-22 DIAGNOSIS — E1140 Type 2 diabetes mellitus with diabetic neuropathy, unspecified: Secondary | ICD-10-CM

## 2021-11-22 DIAGNOSIS — D500 Iron deficiency anemia secondary to blood loss (chronic): Secondary | ICD-10-CM | POA: Diagnosis not present

## 2021-11-22 MED ORDER — FUROSEMIDE 40 MG PO TABS: 40.0000 mg | ORAL_TABLET | Freq: Every day | ORAL | 1 refills | Status: DC

## 2021-11-22 NOTE — Progress Notes (Signed)
OFFICE VISIT  11/22/2021  CC:  Chief Complaint  Patient presents with   Follow-up    Gabapentin now working well.   HPI:    Patient is a 76 y.o. female who presents for 3 mo f/u DM, HTN, IDA, HLD, and NASH cirrhosis. A/P as of last visit: "#1 type 2 diabetes, with diabetic peripheral neuropathy.  Her control has improved significantly.  Continue Invokana 098 mg a day, Trulicity 1.5 mg subcu once a week, and U-500 insulin 110 units with first meal of the day and 110 units with second meal (she only eats 2 meals a day).  She seems to do okay with giving some additional insulin at bedtime if her sugars are greater than 200. Hemoglobin A1c today as well as urine microalbumin/creatinine.  2.  Hypertension.  Well-controlled continue Toprol-XL 100 mg twice a day.  3.  Hyperlipidemia history of statin intolerance.  Lipids and hepatic panel today.  4.  Iron deficiency anemia due to malabsorption plus chronic blood loss from epistaxis. She will continue the small amount of oral iron supplement that she can tolerate.  Additionally, she will contact her ENT to set up appointment to address her ongoing epistaxis. CBC and iron panel today.   #5 NASH cirrhosis.  Followed by Dr. Laural [name]. Hepatic panel normal 04/2021, repeating today. Most recent liver u/s July 2022 stable. She has been vaccinated for HepB.   6 paroxysmal atrial fibrillation.  Asymptomatic.  Continue metoprolol and anticoagulation. She gets appropriate follow-up with cardiology."  INTERIM HX: Labs last visit showed ongoing iron deficiency, mild anemia. She did not get iron infusion after last visit. She is not compliant with taking flintstone MV. EGD 10/31/2021 showed portal hypertensive gastropathy, otherwise normal.  She is scheduled to get an ultrasound of her abdomen as well as liver Dopplers tomorrow--per Dr. Laural [name]. She has not had nearly as many nosebleeds since I last saw her. Her ENT MD wants to do repair of her nasal  septum at some point in the near future.  No home blood pressure monitoring data today.  Home glucoses vary wildly, as per her usual.  She gives herself 110 units of U-500 before breakfast and before her second/final meal of the day, Invokana 100 mg a day, and Trulicity 1.5 mg subcu weekly.  She has a tendency to give herself 20 to 35 units of insulin around bedtime in response to sugar around 200-275.  This almost always results in her having hypoglycemia in the morning. Her elevated sugars in the evening often are due to dietary indiscretion.  She has had some burning/stinging pain in the tips of digits 2 through 5 on both hands for about the last 3 months.  No redness or swelling anywhere on the hands.  Says it is similar to how her feet started feeling long ago from her neuropathy.  She takes 900 mg gabapentin twice a day.  This dose does make her feel sleepy.  Past Medical History:  Diagnosis Date   Carpal tunnel syndrome of right wrist 03/2013   recurrent   Cirrhosis, nonalcoholic ([place]) 07/9146   NASH--> early cirrhotic changes on ultrasound 07/2018. ? to get liver bx if she gets bariatric surgery? Mild portal hypertensive gastropathy on EGD 08/2019.   Fibromyalgia    GAD (generalized anxiety disorder)    GERD    History of iron deficiency anemia 12/2018   Possibly inadequate absorption secondary to chronic/long term PPI therapy + portal hypertensive gastroduodenopathy. No GIB found on EGD, colonosc, and  givens. Iron infusion approx 02/2021. Flintstone MVI.   History of thrombocytopenia 12/2011   Hyperlipidemia    Intolerant of statins   HYPERTENSION    IBS (irritable bowel syndrome)    -D.  Good response to bentyl and imodium as of 06/2018 GI f/u.   IDDM (insulin dependent diabetes mellitus)    with DPN (managed by Dr. Cruzita Lederer but then in 2018 pt preferred to have me manage for her convenience)   Limited mobility    Requires a walker for arthritic pain, widespread musculoskeletal  pain, and neuropathic pain.   Morbid obesity ([place])    As of 11/2018, pt considering sleeve gastectomy vs bipass as of eval by Dr. De Burrs considering as of 12/2019.   Nonalcoholic steatohepatitis    Viral Hep screens NEG.  CT 2015.  Transaminasemia.  U/S 07/2018 showed early changes of cirrhosis.   OSA (obstructive sleep apnea) 09/14/2015   sleep study 09/07/15: severe obstructive sleep apnea with an AHI of 72 and SaO2 low of 75%.>refer to sleep med for eval and tx options   Osteoarthritis    hips, shoulders, knees   PAF (paroxysmal atrial fibrillation) ([place])    One documented episode (after getting EGD 2016).  Was on amiodarone x 3 mo.  Rate control with metoprolol + anticoag with xarelto.    Recurrent epistaxis    Granuloma in L nare cauterized by ENT 04/2020. Another cautery 06/2020   Small fiber neuropathy    Due to DM.  Symmetric hands and feet tingling/numbness.   Ulcerative colitis ([place])    Remicade infusion Q 8 weeks: in clinical and endoscopic remission as of 12/2018 GI f/u.  06/11/19 rpt colonoscopy->cecal and ascending colon colitis.    Past Surgical History:  Procedure Laterality Date   ABDOMINAL HYSTERECTOMY  1980   Paps no longer indicated.   BACTERIAL OVERGROWTH TEST N/A 07/13/2015   Procedure: BACTERIAL OVERGROWTH TEST;  Surgeon: Rogene Houston, MD;  [facility];  Service: Endoscopy;  Laterality: N/A;  730     BILATERAL SALPINGOOPHORECTOMY  02/10/2001   BIOPSY  06/11/2019   Procedure: BIOPSY;  Surgeon: Rogene Houston, MD;  [facility];  Service: Endoscopy;;  colon   BIOPSY  09/27/2019   Procedure: BIOPSY;  Surgeon: Rogene Houston, MD;  [facility];  Service: Endoscopy;;  gastric duodenal   BIOPSY  09/15/2020   Procedure: BIOPSY;  Surgeon: Harvel Quale, MD;  [facility];  Service: Gastroenterology;;   BREAST REDUCTION SURGERY  1994   bilat   CARDIOVASCULAR STRESS TEST  07/2010   Lexiscan myoview:  normal   CARPAL TUNNEL RELEASE Right 1996   CARPAL TUNNEL RELEASE Left 03/21/2003   CARPAL TUNNEL RELEASE Right 05/04/2013   Procedure: CARPAL TUNNEL RELEASE;  Surgeon: Cammie Sickle., MD;  Location: [facility];  Service: Orthopedics;  Laterality: Right;   CARPAL TUNNEL RELEASE Left 09/21/2013   Procedure: LEFT CARPAL TUNNEL RELEASE;  Surgeon: Cammie Sickle., MD;  Location: [facility];  Service: Orthopedics;  Laterality: Left;   CHOLECYSTECTOMY     COLONOSCOPY WITH PROPOFOL N/A 08/04/2015   Colitis in remission.  No polyps.  Procedure: COLONOSCOPY WITH PROPOFOL;  Surgeon: Rogene Houston, MD;  [facility];  Service: Endoscopy;  Laterality: N/A;  cecum time in  0820   time out  0827    total time 7 minutes   COLONOSCOPY WITH PROPOFOL N/A 06/11/2019   cecal and  ascending colon colitis.  Procedure: COLONOSCOPY WITH PROPOFOL;  Surgeon: Rogene Houston, MD;  [facility];  Service: Endoscopy;  Laterality: N/A;  730a   COLONOSCOPY WITH PROPOFOL N/A 09/15/2020   Procedure: COLONOSCOPY WITH PROPOFOL;  Surgeon: Harvel Quale, MD;  [facility];  Service: Gastroenterology;  Laterality: N/A;  1030   ESOPHAGEAL DILATION N/A 08/04/2015   Procedure: ESOPHAGEAL DILATION;  Surgeon: Rogene Houston, MD;  [facility];  Service: Endoscopy;  Laterality: N/AKelvin Cellar, no mucousal disruption   ESOPHAGOGASTRODUODENOSCOPY  09/27/2019   Performed for IDA.  Esoph dilation was done but no stricture present.  Mild portal hypertensive gastropathy, o/w normal.  Duodenal bx NEG.  h pylori neg.   ESOPHAGOGASTRODUODENOSCOPY (EGD) WITH ESOPHAGEAL DILATION  12/02/2005   ESOPHAGOGASTRODUODENOSCOPY (EGD) WITH PROPOFOL N/A 08/04/2015   Procedure: ESOPHAGOGASTRODUODENOSCOPY (EGD) WITH PROPOFOL;  Surgeon: Rogene Houston, MD;  [facility];  Service: Endoscopy;  Laterality: N/A;  procedure 1   ESOPHAGOGASTRODUODENOSCOPY (EGD) WITH PROPOFOL N/A  09/27/2019   Procedure: ESOPHAGOGASTRODUODENOSCOPY (EGD) WITH PROPOFOL;  Surgeon: Rogene Houston, MD;  [facility];  Service: Endoscopy;  Laterality: N/A;  12:10   ESOPHAGOGASTRODUODENOSCOPY (EGD) WITH PROPOFOL N/A 10/31/2021   Procedure: ESOPHAGOGASTRODUODENOSCOPY (EGD) WITH PROPOFOL;  Surgeon: Rogene Houston, MD;  [facility];  Service: Endoscopy;  Laterality: N/A;  [place]  01/17/2012   Procedure: FLEXIBLE SIGMOIDOSCOPY;  Surgeon: Rogene Houston, MD;  [facility];  Service: Endoscopy;  Laterality: N/A;   GIVENS CAPSULE STUDY N/A 08/03/2019   Procedure: GIVENS CAPSULE STUDY (performed for IDA)->some food debris in stomach and small amount of blood.  Surgeon: Rogene Houston, MD;  [facility];  Service: Endoscopy;  Laterality: N/A;  730AM   HEMILAMINOTOMY LUMBAR SPINE Bilateral 09/07/1999   L4-5   KNEE ARTHROSCOPY Right 01/1999; 10/2000   LYSIS OF ADHESION  02/10/2001   MALONEY DILATION  09/27/2019   Procedure: MALONEY DILATION;  Surgeon: Rogene Houston, MD;  [facility];  Service: Endoscopy;;   [place]; 09/12/2006   TARSAL TUNNEL RELEASE  2002   TRANSTHORACIC ECHOCARDIOGRAM  08/04/2015   EF 60-65%, normal wall motion, mild LVH, mild LA dilation, grd I DD.   TUMOR EXCISION Left 03/21/2003   dorsal 1st web space (hand)   URETEROLYSIS Right 02/10/2001    Outpatient Medications Prior to Visit  Medication Sig Dispense Refill   ACCU-CHEK GUIDE test strip USE TO CHECK BLOOD GLUCOSE UP TO 6 TIMES DAILY AS DIRECTED 200 strip 1   Accu-Chek Softclix Lancets lancets USE TO CHECK BLOOD GLUCOSE UP TO 6 TIMES DAILY AS DIRECTED 200 each 1   acetaminophen (TYLENOL) 325 MG tablet Take 650 mg by mouth every 6 (six) hours as needed for moderate pain or headache.     B-D UF III MINI PEN NEEDLES 31G X 5 MM MISC USE TO INJECT INSULINS EQUAL TO 6 TIMES DAILY. 600 each 5   blood glucose meter kit and supplies KIT Use  up to six times daily as directed. DX. E11.9 1 each 0   canagliflozin (INVOKANA) 100 MG TABS tablet Take 1 tablet (100 mg total) by mouth daily before breakfast. 90 tablet 3   citalopram (CELEXA) 20 MG tablet TAKE 1 TABLET BY MOUTH EVERY DAY 90 tablet 3   dicyclomine (BENTYL) 10 MG capsule Take 10 mg by mouth 3 (three) times daily before meals.  Dulaglutide (TRULICITY) 1.5 DQ/2.2WL SOPN INJECT 1.5MG ONCE WEEKLY 18 mL 3   esomeprazole (NEXIUM) 20 MG capsule TAKE 1 CAPSULE BY MOUTH DAILY BEFORE BREAKFAST 90 capsule 3   ferric carboxymaltose (INJECTAFER) 750 MG/15ML SOLN injection Inject 15 mLs (750 mg total) into the vein as directed for 1 dose. Repeat in 7 days 15 mL 1   gabapentin (NEURONTIN) 600 MG tablet TAKE 1 AND 1/2 TABLETS BY MOUTH TWICE A DAY 270 tablet 3   inFLIXimab (REMICADE) 100 MG injection Inject 100 mg into the vein every 8 (eight) weeks.      insulin regular human CONCENTRATED (HUMULIN R U-500 KWIKPEN) 500 UNIT/ML KwikPen Inject 125-135 Units into the skin See admin instructions. Inject 110 units with breakfast, 110 units with dinner, and 50 U q evening prn hyperglycemia 45 mL 3   lactase (LACTAID) 3000 UNITS tablet Take 3,000 Units by mouth as needed (when eating foods containing dairy).      loperamide (IMODIUM) 2 MG capsule Take 1 capsule (2 mg total) by mouth 2 (two) times daily as needed for diarrhea or loose stools. 90 capsule 5   meclizine (ANTIVERT) 25 MG tablet Take 1 tablet (25 mg total) by mouth 3 (three) times daily as needed for dizziness or nausea. 90 tablet 0   metoprolol succinate (TOPROL-XL) 100 MG 24 hr tablet TAKE 1 TABLET BY MOUTH 2 TIMES DAILY. TAKE WITH OR IMMEDIATELY FOLLOWING A MEAL 180 tablet 3   OVER THE COUNTER MEDICATION Iron 2 daily     Pediatric Multivitamins-Iron (FLINTSTONES COMPLETE) 18 MG CHEW Chew 1 tablet by mouth 2 (two) times daily.     potassium chloride (KLOR-CON M10) 10 MEQ tablet Take 1 tablet (10 mEq total) by mouth daily. 180 tablet 0    XARELTO 20 MG TABS tablet TAKE 1 TABLET BY MOUTH DAILY WITH SUPPER 90 tablet 1   furosemide (LASIX) 40 MG tablet TAKE 1 TABLET BY MOUTH EVERY DAY 90 tablet 1   No facility-administered medications prior to visit.    Allergies  Allergen Reactions   Omeprazole Anaphylaxis and Swelling    SWELLING OF TONGUE AND THROAT   Actos [Pioglitazone] Other (See Comments)    Weight gain   Benzocaine-Menthol Swelling    SWELLING OF MOUTH   Colesevelam Other (See Comments)    GI UPSET   Flagyl [Metronidazole Hcl] Other (See Comments)    DIAPHORESIS   Metformin And Related Diarrhea   Shrimp [Shellfish Allergy] Itching    OF THROAT AND EARS   Statins Palpitations   Allevyn Adhesive [Wound Dressings]     Other reaction(s): Unknown   Desipramine Hcl Itching, Nausea Only and Other (See Comments)    "swimmy" headed, ears itched    Hydromorphone Itching   Jardiance [Empagliflozin] Other (See Comments)    weakness   Lactose Intolerance (Gi) Diarrhea    Gas, bloating   Adhesive [Tape] Other (See Comments)    SKIN IRRITATION AND BRUISING   Nisoldipine Itching   Percocet [Oxycodone-Acetaminophen] Itching    ROS As per HPI  PE: Vitals with BMI 11/22/2021 10/31/2021 10/31/2021  Height 5' 0"  - -  Weight 227 lbs - -  BMI 79.89 - -  Systolic 211 941 740  Diastolic 67 41 52  Pulse 44 71 71     Physical Exam  General: Alert and well-appearing. Affect: Pleasant, lucid thought and speech. Cardiovascular: Regular rhythm and rate (65-70 by me).   LABS:  Last CBC Lab Results  Component Value Date  WBC 5.1 10/16/2021   HGB 10.8 (L) 10/16/2021   HCT 34.1 (L) 10/16/2021   MCV 84.8 10/16/2021   MCH 26.9 (L) 10/16/2021   RDW 14.4 10/16/2021   PLT 151 10/16/2021   Lab Results  Component Value Date   IRON 50 08/21/2021   TIBC 361 08/21/2021   FERRITIN 11 (L) 08/21/2021   Lab Results  Component Value Date   VITAMINB12 >2,000 (H) 03/55/9741   Last metabolic panel Lab Results  Component  Value Date   GLUCOSE 209 (H) 10/30/2021   NA 136 10/30/2021   K 3.1 (L) 10/30/2021   CL 106 10/30/2021   CO2 24 10/30/2021   BUN 10 10/30/2021   CREATININE 0.65 10/30/2021   GFRNONAA >60 10/30/2021   CALCIUM 8.6 (L) 10/30/2021   PROT 7.4 10/16/2021   ALBUMIN 3.5 08/21/2021   LABGLOB 3.4 08/17/2015   AGRATIO 1.0 08/17/2015   BILITOT 0.6 10/16/2021   ALKPHOS 78 08/21/2021   AST 46 (H) 10/16/2021   ALT 25 10/16/2021   ANIONGAP 6 10/30/2021   Last lipids Lab Results  Component Value Date   CHOL 165 08/21/2021   HDL 34.20 (L) 08/21/2021   LDLCALC 106 (H) 08/21/2021   LDLDIRECT 69.5 01/24/2011   TRIG 122.0 08/21/2021   CHOLHDL 5 08/21/2021   Last hemoglobin A1c Lab Results  Component Value Date   HGBA1C 6.6 (H) 08/21/2021   Last thyroid functions Lab Results  Component Value Date   TSH 1.09 08/18/2020   IMPRESSION AND PLAN:  #1 diabetes type 2 with neuropathy affecting fingers and feet.  She is on maximum tolerated dose of gabapentin.  I think any additional peripheral neuropathy medications would only add side effects. Continue with best attempts at glucose control. Hemoglobin A1c today.  2.  Hypertension, well controlled on Toprol-XL 100 mg a day.  I do not think the initial heart rate taken today by our machine was accurate.  Heart rate by me was 65-70 and it is always been around 70 in the office and patient says this is what it is at home typically. Electrolytes and creatinine today.  #3 iron deficiency anemia--she has some decreased absorption due to portal hypertensive gastropathy and she has history of recurrent nosebleeds. Plan is for nasal septum surgery per ENT. She does not tolerate oral iron.  She does not really comply well with her MVI with iron. Monitoring CBC and iron today.  #4 hypokalemia.  Her potassium was 3.1 back on September 04, 2021 and October 30, 2021.  She states she was prescribed 20 mEq/d of potassium supplement by Dr. Laural [name]. She does  take 40 mg of Lasix daily. Checking potassium level today.  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 Portal hypertensive gastropathy on recent EGD.  No varices. Patient to get abdominal ultrasound and liver Doppler tomorrow.  An After Visit Summary was printed and given to the patient.  FOLLOW UP: Return in about 3 months (around 02/19/2022) for routine chronic illness f/u.  Signed:  Crissie Sickles, MD           11/22/2021

## 2021-11-23 ENCOUNTER — Other Ambulatory Visit (INDEPENDENT_AMBULATORY_CARE_PROVIDER_SITE_OTHER): Payer: Self-pay | Admitting: *Deleted

## 2021-11-23 ENCOUNTER — Ambulatory Visit (HOSPITAL_COMMUNITY)
Admission: RE | Admit: 2021-11-23 | Discharge: 2021-11-23 | Disposition: A | Discharge status: Home / Self Care | Payer: Medicare PPO | Source: Ambulatory Visit | Attending: Internal Medicine | Admitting: Internal Medicine

## 2021-11-23 DIAGNOSIS — K7581 Nonalcoholic steatohepatitis (NASH): Secondary | ICD-10-CM | POA: Insufficient documentation

## 2021-11-23 DIAGNOSIS — I81 Portal vein thrombosis: Secondary | ICD-10-CM | POA: Diagnosis not present

## 2021-11-23 DIAGNOSIS — Z862 Personal history of diseases of the blood and blood-forming organs and certain disorders involving the immune mechanism: Secondary | ICD-10-CM

## 2021-11-23 DIAGNOSIS — K7460 Unspecified cirrhosis of liver: Secondary | ICD-10-CM

## 2021-11-23 DIAGNOSIS — K7689 Other specified diseases of liver: Secondary | ICD-10-CM | POA: Diagnosis not present

## 2021-11-23 DIAGNOSIS — R160 Hepatomegaly, not elsewhere classified: Secondary | ICD-10-CM | POA: Diagnosis not present

## 2021-11-23 LAB — HEMOGLOBIN A1C
Hgb A1c MFr Bld: 7.4 % of total Hgb — ABNORMAL HIGH (ref <5.7)
Mean Plasma Glucose: 166 mg/dL
eAG (mmol/L): 9.2 mmol/L

## 2021-11-23 LAB — BASIC METABOLIC PANEL
BUN: 10 mg/dL (ref 7–25)
CO2: 26 mmol/L (ref 20–32)
Calcium: 8.7 mg/dL (ref 8.6–10.4)
Chloride: 103 mmol/L (ref 98–110)
Creat: 0.71 mg/dL (ref 0.60–1.00)
Glucose, Bld: 201 mg/dL — ABNORMAL HIGH (ref 65–99)
Potassium: 3.7 mmol/L (ref 3.5–5.3)
Sodium: 138 mmol/L (ref 135–146)

## 2021-11-23 LAB — CBC
HCT: 31.3 % — ABNORMAL LOW (ref 35.0–45.0)
Hemoglobin: 9.5 g/dL — ABNORMAL LOW (ref 11.7–15.5)
MCH: 25 pg — ABNORMAL LOW (ref 27.0–33.0)
MCHC: 30.4 g/dL — ABNORMAL LOW (ref 32.0–36.0)
MCV: 82.4 fL (ref 80.0–100.0)
MPV: 12.5 fL (ref 7.5–12.5)
Platelets: 132 10*3/uL — ABNORMAL LOW (ref 140–400)
RBC: 3.8 10*6/uL (ref 3.80–5.10)
RDW: 15.1 % — ABNORMAL HIGH (ref 11.0–15.0)
WBC: 5.4 10*3/uL (ref 3.8–10.8)

## 2021-11-23 LAB — IRON,TIBC AND FERRITIN PANEL
%SAT: 23 % (calc) (ref 16–45)
Ferritin: 6 ng/mL — ABNORMAL LOW (ref 16–288)
Iron: 83 ug/dL (ref 45–160)
TIBC: 363 mcg/dL (calc) (ref 250–450)

## 2021-11-27 ENCOUNTER — Telehealth: Payer: Self-pay | Admitting: Family Medicine

## 2021-11-27 NOTE — Telephone Encounter (Signed)
Attempted to schedule AWV. Unable to LVM.  Will try at later time.  Called to schedule AWV patient mailbox full

## 2021-11-30 ENCOUNTER — Telehealth (INDEPENDENT_AMBULATORY_CARE_PROVIDER_SITE_OTHER): Payer: Self-pay | Admitting: *Deleted

## 2021-11-30 ENCOUNTER — Other Ambulatory Visit: Payer: Self-pay | Admitting: Family Medicine

## 2021-11-30 NOTE — Telephone Encounter (Signed)
She will need a repeat iron infusion x2 and check Cbc and iron stores in 3 months. ?

## 2021-11-30 NOTE — Telephone Encounter (Signed)
Pt in reminder file for cbc and bmet for 11/30/21. Pt had both test on 11/22/21 with primary care. Hb was 9.5. pcp note on 2/23 has IDA due to malabsorption plus chronic blood loss from epistaxis. Pt is taking one iron supplement daily. Pt states she had iron infusion she thinks back in December. States she is feeling lazy when I asked how she was feeling. Does pt need to repeat cbc and met 7 still since done on 11/22/21 or just repeat cbc now since hb was low or how long should she wait? ?

## 2021-11-30 NOTE — Telephone Encounter (Signed)
error 

## 2021-12-04 ENCOUNTER — Other Ambulatory Visit (INDEPENDENT_AMBULATORY_CARE_PROVIDER_SITE_OTHER): Payer: Self-pay

## 2021-12-04 ENCOUNTER — Other Ambulatory Visit (INDEPENDENT_AMBULATORY_CARE_PROVIDER_SITE_OTHER): Payer: Self-pay | Admitting: *Deleted

## 2021-12-04 DIAGNOSIS — R160 Hepatomegaly, not elsewhere classified: Secondary | ICD-10-CM

## 2021-12-04 MED ORDER — ESOMEPRAZOLE MAGNESIUM 20 MG PO CPDR: DELAYED_RELEASE_CAPSULE | ORAL | 3 refills | Status: DC

## 2021-12-04 NOTE — Telephone Encounter (Signed)
Forms filled out and await Dr. Colman Cater signature when back in office.  ?

## 2021-12-04 NOTE — Telephone Encounter (Signed)
Pt states iron infusion is much cheaper through Dr. Melissa Noon office. I called [phone] and left a message with infusion department to see what we need to do to set this up for patient. ?

## 2021-12-04 NOTE — Telephone Encounter (Signed)
Rosann Auerbach from [facility] rheumatology called back and left vm to send over order to fax number [phone]. If questions can call her back at [phone] ext [phone].  ?

## 2021-12-07 NOTE — Telephone Encounter (Signed)
Forms faxed

## 2021-12-07 NOTE — Progress Notes (Signed)
Noted  

## 2021-12-10 NOTE — Telephone Encounter (Signed)
Ok to change the order please ?

## 2021-12-10 NOTE — Progress Notes (Unsigned)
Subjective:   [name] is a 76 y.o. female who presents for an Initial Medicare Annual Wellness Visit.  Review of Systems    ***       Objective:    There were no vitals filed for this visit. There is no height or weight on file to calculate BMI.  Advanced Directives 10/31/2021 10/30/2021 11/29/2020 09/15/2020 09/13/2020 09/27/2019 09/22/2019  Does Patient Have a Medical Advance Directive? _0  No No  Type of Paramedic of [place];Living will [place];Living will [place];Living will [place];Living will [place];Living will - -  Does patient want to make changes to medical advance directive? - No - Patient declined - - - - -  Copy of [place] in Chart? No - copy requested - - No - copy requested No - copy requested - -  Would patient like information on creating a medical advance directive? No - Patient declined No - Patient declined - - No - Patient declined No - Patient declined No - Patient declined  Pre-existing out of facility DNR order (yellow form or pink MOST form) - - - - - - -    Current Medications (verified) Outpatient Encounter Medications as of 12/11/2021  Medication Sig   ACCU-CHEK GUIDE test strip USE TO CHECK BLOOD GLUCOSE UP TO 6 TIMES DAILY AS DIRECTED   Accu-Chek Softclix Lancets lancets USE TO CHECK BLOOD GLUCOSE UP TO 6 TIMES DAILY AS DIRECTED   acetaminophen (TYLENOL) 325 MG tablet Take 650 mg by mouth every 6 (six) hours as needed for moderate pain or headache.   B-D UF III MINI PEN NEEDLES 31G X 5 MM MISC USE TO INJECT INSULINS EQUAL TO 6 TIMES DAILY.   blood glucose meter kit and supplies KIT Use up to six times daily as directed. DX. E11.9   canagliflozin (INVOKANA) 100 MG TABS tablet Take 1 tablet (100 mg total) by mouth daily before breakfast.   citalopram (CELEXA) 20 MG tablet TAKE 1 TABLET BY MOUTH EVERY DAY    dicyclomine (BENTYL) 10 MG capsule Take 10 mg by mouth 3 (three) times daily before meals.   Dulaglutide (TRULICITY) 1.5 ZO/1.0RU SOPN INJECT 1.5MG ONCE WEEKLY   esomeprazole (NEXIUM) 20 MG capsule TAKE 1 CAPSULE BY MOUTH DAILY BEFORE BREAKFAST   ferric carboxymaltose (INJECTAFER) 750 MG/15ML SOLN injection Inject 15 mLs (750 mg total) into the vein as directed for 1 dose. Repeat in 7 days   furosemide (LASIX) 40 MG tablet Take 1 tablet (40 mg total) by mouth daily.   gabapentin (NEURONTIN) 600 MG tablet TAKE 1 AND 1/2 TABLETS BY MOUTH TWICE A DAY   inFLIXimab (REMICADE) 100 MG injection Inject 100 mg into the vein every 8 (eight) weeks.    insulin regular human CONCENTRATED (HUMULIN R U-500 KWIKPEN) 500 UNIT/ML KwikPen Inject 125-135 Units into the skin See admin instructions. Inject 110 units with breakfast, 110 units with dinner, and 50 U q evening prn hyperglycemia   lactase (LACTAID) 3000 UNITS tablet Take 3,000 Units by mouth as needed (when eating foods containing dairy).    loperamide (IMODIUM) 2 MG capsule Take 1 capsule (2 mg total) by mouth 2 (two) times daily as needed for diarrhea or loose stools.   meclizine (ANTIVERT) 25 MG tablet Take 1 tablet (25 mg total) by mouth 3 (three) times daily as needed for dizziness or nausea.   metoprolol succinate (TOPROL-XL) 100  MG 24 hr tablet TAKE 1 TABLET BY MOUTH 2 TIMES DAILY. TAKE WITH OR IMMEDIATELY FOLLOWING A MEAL   OVER THE COUNTER MEDICATION Iron 2 daily   Pediatric Multivitamins-Iron (FLINTSTONES COMPLETE) 18 MG CHEW Chew 1 tablet by mouth 2 (two) times daily.   potassium chloride (KLOR-CON M10) 10 MEQ tablet Take 1 tablet (10 mEq total) by mouth daily.   XARELTO 20 MG TABS tablet TAKE 1 TABLET BY MOUTH DAILY WITH SUPPER   [DISCONTINUED] amitriptyline (ELAVIL) 25 MG tablet Take 1 tablet (25 mg total) by mouth at bedtime. start with 1/2 tab at night for 1 week then increase to 1 whole tab.   [DISCONTINUED] bromocriptine (PARLODEL) 2.5 MG  tablet Take 2.5 mg by mouth 2 (two) times daily.   [DISCONTINUED] potassium chloride (K-DUR) 10 MEQ tablet Take 2 tablets (20 mEq total) by mouth daily.   No facility-administered encounter medications on file as of 12/11/2021.    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 ? to get liver bx if she gets bariatric surgery? Mild portal hypertensive gastropathy on EGD 08/2019.   Fibromyalgia    GAD (generalized anxiety disorder)    GERD    History of iron deficiency anemia 12/2018   Possibly inadequate absorption secondary to chronic/long term PPI therapy + portal hypertensive gastroduodenopathy. No GIB found on EGD, colonosc, and givens. Iron infusion approx 02/2021. [facility].   History of thrombocytopenia 12/2011   Hyperlipidemia    Intolerant of statins   HYPERTENSION    IBS (irritable bowel syndrome)    -D.  Good response to bentyl and imodium as of 06/2018 GI f/u.   IDDM (insulin dependent diabetes mellitus)    with DPN (managed by Dr. Cruzita Lederer but then in 2018 pt preferred to have me manage for her convenience)   Limited mobility    Requires a walker for arthritic pain, widespread musculoskeletal pain, and neuropathic pain.   Morbid obesity ([place])    As of 11/2018, pt considering sleeve gastectomy vs bipass as of eval by Dr. De Burrs considering as of 12/2019.   Nonalcoholic steatohepatitis    Viral Hep screens NEG.  CT 2015.  Transaminasemia.  U/S 07/2018  showed early changes of cirrhosis.   OSA (obstructive sleep apnea) 09/14/2015   sleep study 09/07/15: severe obstructive sleep apnea with an AHI of 72 and SaO2 low of 75%.>refer to sleep med for eval and tx options   Osteoarthritis    hips, shoulders, knees   PAF (paroxysmal atrial fibrillation) ([place])    One documented episode (after getting EGD 2016).  Was on amiodarone x 3 mo.  Rate control with metoprolol + anticoag with xarelto.    Recurrent epistaxis    Granuloma in L nare cauterized by ENT 04/2020. Another cautery 06/2020   Small fiber neuropathy    Due to DM.  Symmetric hands and feet tingling/numbness.   Ulcerative colitis ([place])    Remicade infusion Q 8 weeks: in clinical and endoscopic remission as of 12/2018 GI f/u.  06/11/19 rpt colonoscopy->cecal and ascending colon colitis.   Past Surgical History:  Procedure Laterality Date   ABDOMINAL HYSTERECTOMY  1980   Paps no longer indicated.   BACTERIAL OVERGROWTH TEST N/A 07/13/2015   Procedure: BACTERIAL OVERGROWTH TEST;  Surgeon: Rogene Houston, MD;  [facility];  Service: Endoscopy;  Laterality: N/A;  730     BILATERAL SALPINGOOPHORECTOMY  02/10/2001   BIOPSY  06/11/2019   Procedure: BIOPSY;  Surgeon: Rogene Houston, MD;  [facility];  Service: Endoscopy;;  colon   BIOPSY  09/27/2019   Procedure: BIOPSY;  Surgeon: Rogene Houston, MD;  [facility];  Service: Endoscopy;;  gastric duodenal   BIOPSY  09/15/2020   Procedure: BIOPSY;  Surgeon: Harvel Quale, MD;  [facility];  Service: Gastroenterology;;   BREAST REDUCTION SURGERY  1994   bilat   CARDIOVASCULAR STRESS TEST  07/2010   Lexiscan myoview: normal   CARPAL TUNNEL RELEASE Right 1996   CARPAL TUNNEL RELEASE Left 03/21/2003   CARPAL TUNNEL RELEASE Right 05/04/2013   Procedure: CARPAL TUNNEL RELEASE;  Surgeon: Cammie Sickle., MD;  Location: [place];  Service: Orthopedics;  Laterality: Right;    CARPAL TUNNEL RELEASE Left 09/21/2013   Procedure: LEFT CARPAL TUNNEL RELEASE;  Surgeon: Cammie Sickle., MD;  Location: [place];  Service: Orthopedics;  Laterality: Left;   CHOLECYSTECTOMY     COLONOSCOPY WITH PROPOFOL N/A 08/04/2015   Colitis in remission.  No polyps.  Procedure: COLONOSCOPY WITH PROPOFOL;  Surgeon: Rogene Houston, MD;  [facility];  Service: Endoscopy;  Laterality: N/A;  cecum time in  0820   time out  0827    total time 7 minutes   COLONOSCOPY WITH PROPOFOL N/A 06/11/2019   cecal and ascending colon colitis.  Procedure: COLONOSCOPY WITH PROPOFOL;  Surgeon: Rogene Houston, MD;  [facility];  Service: Endoscopy;  Laterality: N/A;  730a   COLONOSCOPY WITH PROPOFOL N/A 09/15/2020   Procedure: COLONOSCOPY WITH PROPOFOL;  Surgeon: Harvel Quale, MD;  [facility];  Service: Gastroenterology;  Laterality: N/A;  1030   ESOPHAGEAL DILATION N/A 08/04/2015   Procedure: ESOPHAGEAL DILATION;  Surgeon: Rogene Houston, MD;  [facility];  Service: Endoscopy;  Laterality: N/AKelvin Cellar, no mucousal disruption   ESOPHAGOGASTRODUODENOSCOPY  09/27/2019   Performed for IDA.  Esoph dilation was done but no stricture present.  Mild portal hypertensive gastropathy, o/w normal.  Duodenal bx NEG.  h pylori neg.   ESOPHAGOGASTRODUODENOSCOPY (EGD) WITH ESOPHAGEAL DILATION  12/02/2005   ESOPHAGOGASTRODUODENOSCOPY (EGD) WITH PROPOFOL N/A 08/04/2015   Procedure: ESOPHAGOGASTRODUODENOSCOPY (EGD) WITH PROPOFOL;  Surgeon: Rogene Houston, MD;  [facility];  Service: Endoscopy;  Laterality: N/A;  procedure 1   ESOPHAGOGASTRODUODENOSCOPY (EGD) WITH PROPOFOL N/A 09/27/2019   Procedure: ESOPHAGOGASTRODUODENOSCOPY (EGD) WITH PROPOFOL;  Surgeon: Rogene Houston, MD;  [facility];  Service: Endoscopy;  Laterality: N/A;  12:10   ESOPHAGOGASTRODUODENOSCOPY (EGD) WITH PROPOFOL N/A 10/31/2021   Procedure: ESOPHAGOGASTRODUODENOSCOPY  (EGD) WITH PROPOFOL;  Surgeon: Rogene Houston, MD;  [facility];  Service: Endoscopy;  Laterality: N/A;  [place]  01/17/2012   Procedure: FLEXIBLE SIGMOIDOSCOPY;  Surgeon: Rogene Houston, MD;  [facility];  Service: Endoscopy;  Laterality: N/A;   GIVENS CAPSULE STUDY N/A 08/03/2019   Procedure: GIVENS CAPSULE STUDY (performed for IDA)->some food debris in stomach and small amount of blood.  Surgeon: Rogene Houston, MD;  [facility];  Service: Endoscopy;  Laterality: N/A;  730AM   HEMILAMINOTOMY LUMBAR SPINE Bilateral 09/07/1999   L4-5   KNEE ARTHROSCOPY Right 01/1999; 10/2000   LYSIS OF ADHESION  02/10/2001   MALONEY DILATION  09/27/2019   Procedure: Venia Minks DILATION;  Surgeon: Rogene Houston, MD;  [facility];  Service: Endoscopy;;   [place]; 09/12/2006   TARSAL TUNNEL RELEASE  2002   TRANSTHORACIC ECHOCARDIOGRAM  08/04/2015   EF 60-65%, normal wall motion, mild LVH, mild LA dilation, grd I DD.   TUMOR EXCISION Left 03/21/2003   dorsal 1st web space (hand)   URETEROLYSIS Right 02/10/2001   Family History  Problem Relation Age of Onset   Diabetes Mother    Hypertension Mother    Heart attack Father        Mid 39's   Heart disease Father    Lung disease Father        spot on lung; had lung surgery   Alcohol abuse Other    Hypertension Son    Diabetes Son    Emphysema Maternal Grandfather    Asthma Maternal Grandfather    Colon cancer Paternal Grandfather    Stomach cancer Paternal Grandmother    Neuropathy Neg Hx    Social History   Socioeconomic History   Marital status: Married    Spouse name: Not on file   Number of children: Not on file   Years of education: Not on file   Highest education level: Not on file  Occupational History   Occupation: Retired  Tobacco Use   Smoking status: Never   Smokeless tobacco: Never  Vaping Use   Vaping Use: Never used  Substance and Sexual Activity    Alcohol use: Not Currently    Comment: ocassionally   Drug use: No   Sexual activity: Not Currently    Partners: Male    Birth control/protection: Surgical    Comment: hysterectomy  Other Topics Concern   Not on file  Social History Narrative   She lives with husband in [place] home.  They have one grown son and 2 grandchildren.   She is retired [employer].   Highest of level education:  Some college.   Never smoker.   Alcohol: rare.   Social Determinants of Health   Financial Resource Strain: Not on file  Food Insecurity: No Food Insecurity   Worried About Charity fundraiser in the Last Year: Never true   Ran Out of Food in the Last Year: Never true  Transportation Needs: No Transportation Needs   Lack of Transportation (Medical): No   Lack of Transportation (Non-Medical): No  Physical Activity: Not on file  Stress: No Stress Concern Present   Feeling of Stress : Not at all  Social Connections: Moderately Isolated   Frequency of Communication with Friends and Family: More than three times a week   Frequency of Social Gatherings with Friends and Family: Once a week   Attends Religious Services: Never   Marine scientist or Organizations: No   Attends Music therapist: Never   Marital Status: Married    Tobacco Counseling Counseling given: Not Answered   Clinical Intake:  Pre-visit preparation completed: Yes  Pain : No/denies pain     Nutritional Risks: None Diabetes: Yes CBG done?: No Did pt. bring in CBG monitor from home?: No     Diabetic?***  Interpreter Needed?: No      Activities of Daily Living In your present state  of health, do you have any difficulty performing the following activities: 10/30/2021  Hearing? N  Vision? N  Difficulty concentrating or making decisions? N  Walking or climbing stairs? N  Dressing or bathing? N  Doing errands, shopping? N  Some recent data might be hidden    Patient Care  Team: Tammi Sou, MD as PCP - General (Family Medicine) Stanford Breed, Denice Bors, MD as PCP - Cardiology (Cardiology) Rogene Houston, MD (Gastroenterology) Lelon Perla, MD as Consulting Physician (Cardiology) Clent Jacks, MD as Consulting Physician (Ophthalmology) Megan Salon, MD (Obstetrics and Gynecology) Theodis Sato, MD (Inactive) (Orthopedic Surgery) Levy Sjogren, MD as Referring Physician (Dermatology) Leeroy Cha, MD as Consulting Physician (Neurosurgery) Hennie Duos, MD as Consulting Physician (Rheumatology) Jerrell Belfast, MD as Consulting Physician (Otolaryngology)  Indicate any recent Medical Services you may have received from other than [facility] providers in the past year (date may be approximate).     Assessment:   This is a routine wellness examination for [place].  Hearing/Vision screen No results found.  Dietary issues and exercise activities discussed:     Goals Addressed   None   Depression Screen PHQ 2/9 Scores 08/21/2021 11/29/2020 08/18/2020 04/21/2019 02/24/2019 10/15/2018 06/29/2018  PHQ - 2 Score 0 0 0 0 0 0 0  PHQ- 9 Score - - - - - - 6    Fall Risk Fall Risk  08/21/2021 11/29/2020 04/18/2020 02/24/2019 10/15/2018  Falls in the past year? _0 0 1  Comment - - - - -  Number falls in past yr: 0 1 1 - 0  Injury with Fall? 1 0 0 - 1  Risk for fall due to : - History of fall(s) - - -  Follow up Falls evaluation completed Falls prevention discussed Falls evaluation completed - -    FALL RISK PREVENTION PERTAINING TO THE HOME:  Any stairs in or around the home? {YES/NO:21197} If so, are there any without handrails? {YES/NO:21197} Home free of loose throw rugs in walkways, pet beds, electrical cords, etc? {YES/NO:21197} Adequate lighting in your home to reduce risk of falls? {YES/NO:21197}  ASSISTIVE DEVICES UTILIZED TO PREVENT FALLS:  Life alert? {YES/NO:21197} Use of a cane, walker or w/c? {YES/NO:21197} Grab bars in the  bathroom? {YES/NO:21197} Shower chair or bench in shower? {YES/NO:21197} Elevated toilet seat or a handicapped toilet? {YES/NO:21197}  TIMED UP AND GO:  Was the test performed? {YES/NO:21197}.  Length of time to ambulate 10 feet: *** sec.   {Appearance of HYWV:3710626}  Cognitive Function:        Immunizations Immunization History  Administered Date(s) Administered   Fluad Quad(high Dose 65+) 08/18/2019, 08/18/2020, 08/21/2021   Hepatitis B, adult 10/26/2020, 02/21/2021, 05/23/2021   Influenza Split 08/17/2012   Influenza Whole 06/30/2009, 07/12/2010   Influenza, High Dose Seasonal PF 08/29/2015, 09/09/2016, 08/13/2017, 06/29/2018   Influenza,inj,Quad PF,6+ Mos 06/08/2013, 08/18/2014   Moderna Sars-Covid-2 Vaccination 12/07/2019, 01/11/2020   Pneumococcal Conjugate-13 08/18/2014   Pneumococcal Polysaccharide-23 11/26/2016   Td 09/30/2008   Tdap 04/16/2019   Zoster Recombinat (Shingrix) 08/21/2021, 11/22/2021   Zoster, Live 08/17/2012    TDAP status: Up to date  Flu Vaccine status: Up to date  Pneumococcal vaccine status: Up to date  Covid-19 vaccine status: Information provided on how to obtain vaccines.   Qualifies for Shingles Vaccine? Yes   Zostavax completed No   Shingrix Completed?: Yes  Screening Tests Health Maintenance  Topic Date Due   COVID-19 Vaccine (3 - Moderna risk series)  02/08/2020   MAMMOGRAM  04/28/2021   HEMOGLOBIN A1C  05/22/2022   FOOT EXAM  05/23/2022   OPHTHALMOLOGY EXAM  07/03/2022   URINE MICROALBUMIN  08/21/2022   COLONOSCOPY (Pts 45-98yr Insurance coverage will need to be confirmed)  09/15/2025   TETANUS/TDAP  04/15/2029   Pneumonia Vaccine 75 Years old  Completed   INFLUENZA VACCINE  Completed   DEXA SCAN  Completed   Hepatitis C Screening  Completed   Zoster Vaccines- Shingrix  Completed   HPV VACCINES  Aged Out    Health Maintenance  Health Maintenance Due  Topic Date Due   COVID-19 Vaccine (3 - Moderna risk series)  02/08/2020   MAMMOGRAM  04/28/2021    Colorectal cancer screening: Type of screening: Colonoscopy. Completed 09/15/2020. Repeat every 5 years  Mammogram status: Ordered ***. Pt provided with contact info and advised to call to schedule appt.   Bone Density status: Completed 04/29/2019. Results reflect: Bone density results: OSTEOPENIA. Repeat every 2 years.  Lung Cancer Screening: (Low Dose CT Chest recommended if Age 75-80years, 30 pack-year currently smoking OR have quit w/in 15years.) {DOES NOT does:27190::"does not"} qualify.   Lung Cancer Screening Referral: ***  Additional Screening:  Hepatitis C Screening: does qualify; Completed 09/13/2020  Vision Screening: Recommended annual ophthalmology exams for early detection of glaucoma and other disorders of the eye. Is the patient up to date with their annual eye exam?  {YES/NO:21197} Who is the provider or what is the name of the office in which the patient attends annual eye exams? *** If pt is not established with a provider, would they like to be referred to a provider to establish care? {YES/NO:21197}.   Dental Screening: Recommended annual dental exams for proper oral hygiene  Community Resource Referral / Chronic Care Management: CRR required this visit?  No   CCM required this visit?  Yes      Plan:     I have personally reviewed and noted the following in the patients chart:   Medical and social history Use of alcohol, tobacco or illicit drugs  Current medications and supplements including opioid prescriptions. Patient is not currently taking opioid prescriptions. Functional ability and status Nutritional status Physical activity Advanced directives List of other physicians Hospitalizations, surgeries, and ER visits in previous 12 months Vitals Screenings to include cognitive, depression, and falls Referrals and appointments  In addition, I have reviewed and discussed with patient certain preventive  protocols, quality metrics, and best practice recommendations. A written personalized care plan for preventive services as well as general preventive health recommendations were provided to patient.     VOctaviano Glow CMA   12/10/2021   Nurse Notes: ***

## 2021-12-10 NOTE — Telephone Encounter (Signed)
[name] from [facility] called states they do not give feraheme and wanted to change order to injectfer. States they have given her this in the past June 2022. If ok to change order I will fax new order.  ? ?Phone - [phone] ext [phone] ?Fax [phone] ?

## 2021-12-10 NOTE — Patient Instructions (Signed)

## 2021-12-10 NOTE — Telephone Encounter (Signed)
New orders faxed over. Await appt ?

## 2021-12-11 ENCOUNTER — Other Ambulatory Visit: Payer: Self-pay

## 2021-12-11 ENCOUNTER — Ambulatory Visit (INDEPENDENT_AMBULATORY_CARE_PROVIDER_SITE_OTHER): Payer: Medicare PPO

## 2021-12-11 DIAGNOSIS — Z0000 Encounter for general adult medical examination without abnormal findings: Secondary | ICD-10-CM

## 2021-12-11 DIAGNOSIS — Z1231 Encounter for screening mammogram for malignant neoplasm of breast: Secondary | ICD-10-CM

## 2021-12-14 ENCOUNTER — Telehealth (INDEPENDENT_AMBULATORY_CARE_PROVIDER_SITE_OTHER): Payer: Self-pay | Admitting: *Deleted

## 2021-12-14 NOTE — Telephone Encounter (Signed)
Called [facility] to follow up on injectafer infusion appt and she is scheduled for first infusion on 3/28 and 2nd infusion on 4/5.  ?

## 2021-12-14 NOTE — Telephone Encounter (Signed)
Tried calling patient back and no answer.  ?

## 2021-12-14 NOTE — Telephone Encounter (Signed)
Patient L/M - returning someone's call  ? ?Ph#: [phone] ?

## 2021-12-17 NOTE — Telephone Encounter (Signed)
I spoke with [facility] and they have set up infusion and also spoke with pt. She is aware of appt for infusion.  ?

## 2021-12-25 DIAGNOSIS — D500 Iron deficiency anemia secondary to blood loss (chronic): Secondary | ICD-10-CM | POA: Diagnosis not present

## 2021-12-28 ENCOUNTER — Telehealth: Payer: Self-pay | Admitting: *Deleted

## 2021-12-28 ENCOUNTER — Telehealth: Payer: Self-pay | Admitting: Cardiology

## 2021-12-28 NOTE — Telephone Encounter (Signed)
Pt agreeable to plan of care for tele pre op appt 12/31/21 @ 1:20 per pt request for time slot. Med rec and consent are done.  ?

## 2021-12-28 NOTE — Telephone Encounter (Signed)
Patient with diagnosis of A Fib on Xarelto for anticoagulation.   ? ?Procedure:  Septoplasty, turbinate reduction and endoscopic nasal cautery ?Date of procedure: TBD ? ? ?CHA2DS2-VASc Score = 4  ?This indicates a 4.8% annual risk of stroke. ?The patient's score is based upon: ?CHF History: 0 ?HTN History: 1 ?Diabetes History: 1 ?Stroke History: 0 ?Vascular Disease History: 0 ?Age Score: 1 ?Gender Score: 1 ?  ?CrCl 75 mL/min using adjusted body weight ?Platelet count 132K ? ?Per office protocol, patient can hold Xarelto for 3 days prior to procedure.   ?

## 2021-12-28 NOTE — Telephone Encounter (Signed)
? ?  Pre-operative Risk Assessment  ?  ?Patient Name: [name]  ?DOB: 11-25-1945 ?MRN: [id number]  ? ?  ? ?Request for Surgical Clearance   ? ?Procedure:  Septoplasty, turbinate reduction and endoscopic nasal cautery ? ?Date of Surgery:  Clearance TBD                              ?   ?Surgeon:  Dr. Jerrell Belfast ?Surgeon's Group or Practice Name:  [facility]  ?Phone number:  [phone] ?Fax number:   ?  ?Type of Clearance Requested:   ?- Medical  ?- Pharmacy:  Hold TBD by Cardiology  ?  ?Type of Anesthesia:  General  ?  ?Additional requests/questions:   ? ?Signed, ?Johnna Acosta   ?12/28/2021, 10:53 AM   ?

## 2021-12-28 NOTE — Telephone Encounter (Signed)
Pt agreeable to plan of care for tele pre op appt 12/31/21 @ 1:20 per pt request for time slot. Med rec and consent are done.  ? ?  ?Patient Consent for Virtual Visit  ? ? ?   ? ?[name] has provided verbal consent on 12/28/2021 for a virtual visit (video or telephone). ? ? ?CONSENT FOR VIRTUAL VISIT FOR:  [name]  ?By participating in this virtual visit I agree to the following: ? ?I hereby voluntarily request, consent and authorize [facility] and its employed or contracted physicians, physician assistants, nurse practitioners or other licensed health care professionals (the Practitioner), to provide me with telemedicine health care services (the ?Services") as deemed necessary by the treating Practitioner. I acknowledge and consent to receive the Services by the Practitioner via telemedicine. I understand that the telemedicine visit will involve communicating with the Practitioner through live audiovisual communication technology and the disclosure of certain medical information by electronic transmission. I acknowledge that I have been given the opportunity to request an in-person assessment or other available alternative prior to the telemedicine visit and am voluntarily participating in the telemedicine visit. ? ?I understand that I have the right to withhold or withdraw my consent to the use of telemedicine in the course of my care at any time, without affecting my right to future care or treatment, and that the Practitioner or I may terminate the telemedicine visit at any time. I understand that I have the right to inspect all information obtained and/or recorded in the course of the telemedicine visit and may receive copies of available information for a reasonable fee.  I understand that some of the potential risks of receiving the Services via telemedicine include:  ?Delay or interruption in medical evaluation due to technological equipment failure or disruption; ?Information transmitted may  not be sufficient (e.g. poor resolution of images) to allow for appropriate medical decision making by the Practitioner; and/or  ?In rare instances, security protocols could fail, causing a breach of personal health information. ? ?Furthermore, I acknowledge that it is my responsibility to provide information about my medical history, conditions and care that is complete and accurate to the best of my ability. I acknowledge that Practitioner's advice, recommendations, and/or decision may be based on factors not within their control, such as incomplete or inaccurate data provided by me or distortions of diagnostic images or specimens that may result from electronic transmissions. I understand that the practice of medicine is not an exact science and that Practitioner makes no warranties or guarantees regarding treatment outcomes. I acknowledge that a copy of this consent can be made available to me via my patient portal ([facility]), or I can request a printed copy by calling the office of [facility].   ? ?I understand that my insurance will be billed for this visit.  ? ?I have read or had this consent read to me. ?I understand the contents of this consent, which adequately explains the benefits and risks of the Services being provided via telemedicine.  ?I have been provided ample opportunity to ask questions regarding this consent and the Services and have had my questions answered to my satisfaction. ?I give my informed consent for the services to be provided through the use of telemedicine in my medical care ? ? ? ?

## 2021-12-28 NOTE — Telephone Encounter (Signed)
Preoperative team, please contact this patient and set up a phone call appointment for further cardiac evaluation.  Thank you for your help. ? ?[name]. Cele Mote NP-C ? ?  ?12/28/2021, 12:38 PM ?[facility] ?[address] ?Office [phone] Fax [phone] ? ?

## 2021-12-31 ENCOUNTER — Ambulatory Visit (INDEPENDENT_AMBULATORY_CARE_PROVIDER_SITE_OTHER): Payer: Medicare PPO | Admitting: Physician Assistant

## 2021-12-31 DIAGNOSIS — Z01810 Encounter for preprocedural cardiovascular examination: Secondary | ICD-10-CM

## 2021-12-31 NOTE — Progress Notes (Signed)
? ?Virtual Visit via Telephone Note  ? ?This visit type was conducted due to national recommendations for restrictions regarding the COVID-19 Pandemic (e.g. social distancing) in an effort to limit this patient's exposure and mitigate transmission in our community.  Due to her co-morbid illnesses, this patient is at least at moderate risk for complications without adequate follow up.  This format is felt to be most appropriate for this patient at this time.  The patient did not have access to video technology/had technical difficulties with video requiring transitioning to audio format only (telephone).  All issues noted in this document were discussed and addressed.  No physical exam could be performed with this format.  Please refer to the patient's chart for her  consent to telehealth for [facility]. ? ?Evaluation Performed:  Preoperative cardiovascular risk assessment ?_____________  ? ?Date:  12/31/2021  ? ?Patient ID:  [name], [name] July 28, 1946, MRN [id number] ?Patient Location:  ?Home ?Provider location:   ?Office ? ?Primary Care Provider:  Tammi Sou, MD ?Primary Cardiologist:  Kirk Ruths, MD ? ?Chief Complaint  ?  ?76 y.o. y/o female with a h/o PAF, fibromyalgia, NASH cirrhosis, hyperlipidemia, hypertension and IDDM, who is pending septum surgery due to difficulties with nosebleed, and presents today for telephonic preoperative cardiovascular risk assessment. ? ?Past Medical History  ?  ?Past Medical History:  ?Diagnosis Date  ? 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 recurrent  ? Cirrhosis, nonalcoholic ([place]) 93/2671  ? NASH--> early cirrhotic changes on ultrasound 07/2018. ? to get liver bx if she gets bariatric surgery? Mild portal hypertensive gastropathy on EGD 08/2019.  ? Fibromyalgia   ? GAD (generalized anxiety disorder)   ? GERD   ? History of iron deficiency anemia 12/2018  ? Possibly inadequate absorption secondary to chronic/long term PPI therapy + portal hypertensive  gastroduodenopathy. No GIB found on EGD, colonosc, and givens. Iron infusion approx 02/2021. [facility].  ? History of thrombocytopenia 12/2011  ? Hyperlipidemia   ? Intolerant of statins  ? HYPERTENSION   ? IBS (irritable bowel syndrome)   ? -D.  Good response to bentyl and imodium as of 06/2018 GI f/u.  ? IDDM (insulin dependent diabetes mellitus)   ? with DPN (managed by Dr. Cruzita Lederer but then in 2018 pt preferred to have me manage for her convenience)  ? Limited mobility   ? Requires a walker for arthritic pain, widespread musculoskeletal pain, and neuropathic pain.  ? Morbid obesity ([place])   ? As of 11/2018, pt considering sleeve gastectomy vs bipass as of eval by Dr. De Burrs considering as of 12/2019.  ? Nonalcoholic steatohepatitis   ? Viral Hep screens NEG.  CT 2015.  Transaminasemia.  U/S 07/2018 showed early changes of cirrhosis.  ? OSA (obstructive sleep apnea) 09/14/2015  ? sleep study 09/07/15: severe obstructive sleep apnea with an AHI of 72 and SaO2 low of 75%.>refer to sleep med for eval and tx options  ? Osteoarthritis   ? hips, shoulders, knees  ? PAF (paroxysmal atrial fibrillation) ([place])   ? One documented episode (after getting EGD 2016).  Was on amiodarone x 3 mo.  Rate control with metoprolol + anticoag with xarelto.   ? Recurrent epistaxis   ? Granuloma in L nare cauterized by ENT 04/2020. Another cautery 06/2020  ? Small fiber neuropathy   ? Due to DM.  Symmetric hands and feet tingling/numbness.  ? Ulcerative colitis ([place])   ? Remicade infusion Q 8 weeks: in clinical  and endoscopic remission as of 12/2018 GI f/u.  06/11/19 rpt colonoscopy->cecal and ascending colon colitis.  ? ?Past Surgical History:  ?Procedure Laterality Date  ? ABDOMINAL HYSTERECTOMY  1980  ? Paps no longer indicated.  ? BACTERIAL OVERGROWTH TEST N/A 07/13/2015  ? Procedure: BACTERIAL OVERGROWTH TEST;  Surgeon: Rogene Houston, MD;  [facility];  Service: Endoscopy;  Laterality: N/A;  730 ? ?  ? BILATERAL  SALPINGOOPHORECTOMY  02/10/2001  ? BIOPSY  06/11/2019  ? Procedure: BIOPSY;  Surgeon: Rogene Houston, MD;  [facility];  Service: Endoscopy;;  colon  ? BIOPSY  09/27/2019  ? Procedure: BIOPSY;  Surgeon: Rogene Houston, MD;  [facility];  Service: Endoscopy;;  gastric ?duodenal  ? BIOPSY  09/15/2020  ? Procedure: BIOPSY;  Surgeon: Montez Morita, Quillian Quince, MD;  [facility];  Service: Gastroenterology;;  ? [place]  ? bilat  ? CARDIOVASCULAR STRESS TEST  07/2010  ? Lexiscan myoview: normal  ? CARPAL TUNNEL RELEASE Right 1996  ? CARPAL TUNNEL RELEASE Left 03/21/2003  ? CARPAL TUNNEL RELEASE Right 05/04/2013  ? Procedure: CARPAL TUNNEL RELEASE;  Surgeon: Cammie Sickle., MD;  Location: [facility];  Service: Orthopedics;  Laterality: Right;  ? CARPAL TUNNEL RELEASE Left 09/21/2013  ? Procedure: LEFT CARPAL TUNNEL RELEASE;  Surgeon: Cammie Sickle., MD;  Location: [facility];  Service: Orthopedics;  Laterality: Left;  ? CHOLECYSTECTOMY    ? COLONOSCOPY WITH PROPOFOL N/A 08/04/2015  ? Colitis in remission.  No polyps.  Procedure: COLONOSCOPY WITH PROPOFOL;  Surgeon: Rogene Houston, MD;  [facility];  Service: Endoscopy;  Laterality: N/A;  cecum time in  0820   time out  0827    total time 7 minutes  ? COLONOSCOPY WITH PROPOFOL N/A 06/11/2019  ? cecal and ascending colon colitis.  Procedure: COLONOSCOPY WITH PROPOFOL;  Surgeon: Rogene Houston, MD;  [facility];  Service: Endoscopy;  Laterality: N/A;  730a  ? COLONOSCOPY WITH PROPOFOL N/A 09/15/2020  ? Procedure: COLONOSCOPY WITH PROPOFOL;  Surgeon: Harvel Quale, MD;  [facility];  Service: Gastroenterology;  Laterality: N/A;  1030  ? ESOPHAGEAL DILATION N/A 08/04/2015  ? Procedure: ESOPHAGEAL DILATION;  Surgeon: Rogene Houston, MD;  [facility];  Service: Endoscopy;  Laterality: N/A;  [place] 57, no mucousal disruption  ?  ESOPHAGOGASTRODUODENOSCOPY  09/27/2019  ? Performed for IDA.  Esoph dilation was done but no stricture present.  Mild portal hypertensive gastropathy, o/w normal.  Duodenal bx NEG.  h pylori neg.  ? ESOPHAGOGASTRODUODENOSCOPY (EGD) WITH ESOPHAGEAL DILATION  12/02/2005  ? ESOPHAGOGASTRODUODENOSCOPY (EGD) WITH PROPOFOL N/A 08/04/2015  ? Procedure: ESOPHAGOGASTRODUODENOSCOPY (EGD) WITH PROPOFOL;  Surgeon: Rogene Houston, MD;  [facility];  Service: Endoscopy;  Laterality: N/A;  procedure 1  ? ESOPHAGOGASTRODUODENOSCOPY (EGD) WITH PROPOFOL N/A 09/27/2019  ? Procedure: ESOPHAGOGASTRODUODENOSCOPY (EGD) WITH PROPOFOL;  Surgeon: Rogene Houston, MD;  [facility];  Service: Endoscopy;  Laterality: N/A;  12:10  ? ESOPHAGOGASTRODUODENOSCOPY (EGD) WITH PROPOFOL N/A 10/31/2021  ? Procedure: ESOPHAGOGASTRODUODENOSCOPY (EGD) WITH PROPOFOL;  Surgeon: Rogene Houston, MD;  [facility];  Service: Endoscopy;  Laterality: N/A;  930  ? FLEXIBLE SIGMOIDOSCOPY  01/17/2012  ? Procedure: FLEXIBLE SIGMOIDOSCOPY;  Surgeon: Rogene Houston, MD;  [facility];  Service: Endoscopy;  Laterality: N/A;  ? GIVENS CAPSULE STUDY N/A 08/03/2019  ? Procedure:  GIVENS CAPSULE STUDY (performed for IDA)->some food debris in stomach and small amount of blood.  Surgeon: Rogene Houston, MD;  [facility];  Service: Endoscopy;  Laterality: N/A;  730AM  ? HEMILAMINOTOMY LUMBAR SPINE Bilateral 09/07/1999  ? L4-5  ? KNEE ARTHROSCOPY Right 01/1999; 10/2000  ? LYSIS OF ADHESION  02/10/2001  ? MALONEY DILATION  09/27/2019  ? Procedure: MALONEY DILATION;  Surgeon: Rogene Houston, MD;  [facility];  Service: Endoscopy;;  ? [place]; 09/12/2006  ? TARSAL TUNNEL RELEASE  2002  ? TRANSTHORACIC ECHOCARDIOGRAM  08/04/2015  ? EF 60-65%, normal wall motion, mild LVH, mild LA dilation, grd I DD.  ? TUMOR EXCISION Left 03/21/2003  ? dorsal 1st web space (hand)  ? URETEROLYSIS Right 02/10/2001   ? ? ?Allergies ? ?Allergies  ?Allergen Reactions  ? Omeprazole Anaphylaxis and Swelling  ?  SWELLING OF TONGUE AND THROAT  ? Actos [Pioglitazone] Other (See Comments)  ?  Weight gain  ? Benzocaine-Menthol Swelling  ?  SWELLING

## 2022-01-02 DIAGNOSIS — D649 Anemia, unspecified: Secondary | ICD-10-CM | POA: Diagnosis not present

## 2022-01-02 DIAGNOSIS — D500 Iron deficiency anemia secondary to blood loss (chronic): Secondary | ICD-10-CM | POA: Diagnosis not present

## 2022-01-03 ENCOUNTER — Telehealth (INDEPENDENT_AMBULATORY_CARE_PROVIDER_SITE_OTHER): Payer: Self-pay | Admitting: *Deleted

## 2022-01-03 ENCOUNTER — Other Ambulatory Visit (INDEPENDENT_AMBULATORY_CARE_PROVIDER_SITE_OTHER): Payer: Self-pay | Admitting: Gastroenterology

## 2022-01-03 ENCOUNTER — Ambulatory Visit (HOSPITAL_COMMUNITY)
Admission: RE | Admit: 2022-01-03 | Discharge: 2022-01-03 | Disposition: A | Discharge status: Home / Self Care | Payer: Medicare PPO | Source: Ambulatory Visit | Attending: Internal Medicine | Admitting: Internal Medicine

## 2022-01-03 DIAGNOSIS — K7460 Unspecified cirrhosis of liver: Secondary | ICD-10-CM | POA: Diagnosis not present

## 2022-01-03 DIAGNOSIS — Z9049 Acquired absence of other specified parts of digestive tract: Secondary | ICD-10-CM | POA: Diagnosis not present

## 2022-01-03 DIAGNOSIS — R160 Hepatomegaly, not elsewhere classified: Secondary | ICD-10-CM | POA: Insufficient documentation

## 2022-01-03 MED ORDER — GADOBUTROL 1 MMOL/ML IV SOLN
10.0000 mL | Freq: Once | INTRAVENOUS | Status: AC | PRN
  Administered 2022-01-03: 10 mL via INTRAVENOUS

## 2022-01-03 MED ORDER — DICYCLOMINE HCL 10 MG PO CAPS: 10.0000 mg | ORAL_CAPSULE | Freq: Three times a day (TID) | ORAL | 3 refills | Status: DC | PRN

## 2022-01-03 NOTE — Telephone Encounter (Signed)
Dr. Laural [name] just called and said he could not make it in today and this patient walked in this morning asking for refill. Can you look at it and see if you could do it for her since we closed for 3 days.  ?

## 2022-01-03 NOTE — Telephone Encounter (Signed)
Med sent to pharm. I called [facility] and they are getting it ready for her now and I let pt know.  ?

## 2022-01-03 NOTE — Telephone Encounter (Signed)
Medication sent to pharmacy  

## 2022-01-03 NOTE — Telephone Encounter (Signed)
Pt walked in office said [facility] told her she needed to pick up prescription from drs office and bring to pharmacy. She wanted dicyclomine 9m one tid refill.  ? ?Pt seen 09-2021 ?[facility] ?Pt's number [phone]? ?

## 2022-01-10 DIAGNOSIS — K5180 Other ulcerative colitis without complications: Secondary | ICD-10-CM | POA: Diagnosis not present

## 2022-01-11 ENCOUNTER — Other Ambulatory Visit: Payer: Self-pay | Admitting: Otolaryngology

## 2022-01-20 ENCOUNTER — Other Ambulatory Visit (INDEPENDENT_AMBULATORY_CARE_PROVIDER_SITE_OTHER): Payer: Self-pay | Admitting: Gastroenterology

## 2022-01-21 NOTE — Progress Notes (Addendum)
Surgical Instructions ? ? ? Your procedure is scheduled on 01/25/22. ? Report to [facility] Main Entrance "A" at 5:30 A.M., then check in with the Admitting office. ? Call this number if you have problems the morning of surgery: ? [phone] ? ? If you have any questions prior to your surgery date call [phone]: Open Monday-Friday 8am-4pm ? ? ? Remember: ? Do not eat or drink after midnight the night before your surgery ? ? ?  ? Take these medicines the morning of surgery with A SIP OF WATER:  ?amitriptyline (ELAVIL) ?esomeprazole ([place]) ?gabapentin (NEURONTIN) ?metoprolol succinate (TOPROL-XL) ? ?AS NEEDED: ?acetaminophen (TYLENOL) ?dicyclomine (BENTYL) ?loperamide (IMODIUM) ?meclizine (ANTIVERT)  ? ?Follow your surgeon's instructions on when to stop Xarelto.  If no instructions were given by your surgeon then you will need to call the office to get those instructions.    ? ? ?As of today, STOP taking any Aspirin (unless otherwise instructed by your surgeon) Aleve, Naproxen, Ibuprofen, Motrin, Advil, Goody's, BC's, all herbal medications, fish oil, and all vitamins. ? ?WHAT DO I DO ABOUT MY DIABETES MEDICATION? ? ? ?Do not take oral diabetes medicines (pills) the morning of surgery.  DO NOT take INVOKANA the day before surgery or the day of surgery. ? ?DO NOT take TRULICITY the day of surgery. ? ?Contact your primary care physician or endocrinologist regarding dose of insulin regular human CONCENTRATED (HUMULIN R U-500 KWIKPEN). ? ?The day of surgery, do not take other diabetes injectables, including Byetta (exenatide), Bydureon (exenatide ER), Victoza (liraglutide), or Trulicity (dulaglutide). ? ? ? ?HOW TO MANAGE YOUR DIABETES ?BEFORE AND AFTER SURGERY ? ?Why is it important to control my blood sugar before and after surgery? ?Improving blood sugar levels before and after surgery helps healing and can limit problems. ?A way of improving blood sugar control is eating a healthy diet by: ? Eating less sugar  and carbohydrates ? Increasing activity/exercise ? Talking with your doctor about reaching your blood sugar goals ?High blood sugars (greater than 180 mg/dL) can raise your risk of infections and slow your recovery, so you will need to focus on controlling your diabetes during the weeks before surgery. ?Make sure that the doctor who takes care of your diabetes knows about your planned surgery including the date and location. ? ?How do I manage my blood sugar before surgery? ?Check your blood sugar at least 4 times a day, starting 2 days before surgery, to make sure that the level is not too high or low. ? ?Check your blood sugar the morning of your surgery when you wake up and every 2 hours until you get to the Short Stay unit. ? ?If your blood sugar is less than 70 mg/dL, you will need to treat for low blood sugar: ?Do not take insulin. ?Treat a low blood sugar (less than 70 mg/dL) with ? cup of clear juice (cranberry or apple), 4 glucose tablets, OR glucose gel. ?Recheck blood sugar in 15 minutes after treatment (to make sure it is greater than 70 mg/dL). If your blood sugar is not greater than 70 mg/dL on recheck, call [phone] for further instructions. ?Report your blood sugar to the short stay nurse when you get to Short Stay. ? ?If you are admitted to the hospital after surgery: ?Your blood sugar will be checked by the staff and you will probably be given insulin after surgery (instead of oral diabetes medicines) to make sure you have good blood sugar levels. ?The goal for blood sugar control  after surgery is 80-180 mg/dL.  ? ?         ?Do not wear jewelry or makeup ?Do not wear lotions, powders, perfumes or deodorant. ?Do not shave 48 hours prior to surgery.  ?Do not bring valuables to the hospital. ?Do not wear nail polish, gel polish, artificial nails, or any other type of covering on natural nails (fingers and toes) ?If you have artificial nails or gel coating that need to be removed by a nail salon,  please have this removed prior to surgery. Artificial nails or gel coating may interfere with anesthesia's ability to adequately monitor your vital signs. ? ?[facility] is not responsible for any belongings or valuables. .  ? ?Do NOT Smoke (Tobacco/Vaping)  24 hours prior to your procedure ? ?If you use a CPAP at night, you may bring your mask for your overnight stay. ?  ?Contacts, glasses, hearing aids, dentures or partials may not be worn into surgery, please bring cases for these belongings ?  ?For patients admitted to the hospital, discharge time will be determined by your treatment team. ?  ?Patients discharged the day of surgery will not be allowed to drive home, and someone needs to stay with them for 24 hours. ? ? ?SURGICAL WAITING ROOM VISITATION ?Patients having surgery or a procedure in a hospital may have two support people. ?Children under the age of 41 must have an adult with them who is not the patient. ?They may stay in the waiting area during the procedure and may switch out with other visitors. If the patient needs to stay at the hospital during part of their recovery, the visitor guidelines for inpatient rooms apply. ? ?Please refer to the [facility] website for the visitor guidelines for Inpatients (after your surgery is over and you are in a regular room).  ? ? ? ?Special instructions:   ? ?Oral Hygiene is also important to reduce your risk of infection.  Remember - BRUSH YOUR TEETH THE MORNING OF SURGERY WITH YOUR REGULAR TOOTHPASTE ? ? ?[facility]- Preparing For Surgery ? ?Before surgery, you can play an important role. Because skin is not sterile, your skin needs to be as free of germs as possible. You can reduce the number of germs on your skin by washing with CHG (chlorahexidine gluconate) Soap before surgery.  CHG is an antiseptic cleaner which kills germs and bonds with the skin to continue killing germs even after washing.   ? ? ?Please do not use if you have an allergy to CHG or  antibacterial soaps. If your skin becomes reddened/irritated stop using the CHG.  ?Do not shave (including legs and underarms) for at least 48 hours prior to first CHG shower. It is OK to shave your face. ? ?Please follow these instructions carefully. ?  ? ? Shower the NIGHT BEFORE SURGERY and the MORNING OF SURGERY with CHG Soap.  ? If you chose to wash your hair, wash your hair first as usual with your normal shampoo. After you shampoo, rinse your hair and body thoroughly to remove the shampoo.  Then [employer] and genitals (private parts) with your normal soap and rinse thoroughly to remove soap. ? ?After that Use CHG Soap as you would any other liquid soap. You can apply CHG directly to the skin and wash gently with a scrungie or a clean washcloth.  ? ?Apply the CHG Soap to your body ONLY FROM THE NECK DOWN.  Do not use on open wounds or open sores.  Avoid contact with your eyes, ears, mouth and genitals (private parts). Wash Face and genitals (private parts)  with your normal soap.  ? ?Wash thoroughly, paying special attention to the area where your surgery will be performed. ? ?Thoroughly rinse your body with warm water from the neck down. ? ?DO NOT shower/wash with your normal soap after using and rinsing off the CHG Soap. ? ?Pat yourself dry with a CLEAN TOWEL. ? ?Wear CLEAN PAJAMAS to bed the night before surgery ? ?Place CLEAN SHEETS on your bed the night before your surgery ? ?DO NOT SLEEP WITH PETS. ? ? ?Day of Surgery: ? ?Take a shower with CHG soap. ?Wear Clean/Comfortable clothing the morning of surgery ?Do not apply any deodorants/lotions.   ?Remember to brush your teeth WITH YOUR REGULAR TOOTHPASTE. ? ? ? ?If you received a COVID test during your pre-op visit, it is requested that you wear a mask when out in public, stay away from anyone that may not be feeling well, and notify your surgeon if you develop symptoms. If you have been in contact with anyone that has tested positive in the last 10 days,  please notify your surgeon. ? ?  ?Please read over the following fact sheets that you were given.   ?

## 2022-01-22 ENCOUNTER — Other Ambulatory Visit: Payer: Self-pay

## 2022-01-22 ENCOUNTER — Encounter (HOSPITAL_COMMUNITY): Payer: Self-pay

## 2022-01-22 ENCOUNTER — Encounter (HOSPITAL_COMMUNITY)
Admission: RE | Admit: 2022-01-22 | Discharge: 2022-01-22 | Disposition: A | Discharge status: Home / Self Care | Payer: Medicare PPO | Source: Ambulatory Visit | Attending: Otolaryngology | Admitting: Otolaryngology

## 2022-01-22 VITALS — BP 134/73 | HR 61 | Temp 98.3°F | Resp 18 | Ht 60.0 in | Wt 220.8 lb

## 2022-01-22 DIAGNOSIS — Z20822 Contact with and (suspected) exposure to covid-19: Secondary | ICD-10-CM | POA: Diagnosis not present

## 2022-01-22 DIAGNOSIS — Z794 Long term (current) use of insulin: Secondary | ICD-10-CM | POA: Insufficient documentation

## 2022-01-22 DIAGNOSIS — Z01812 Encounter for preprocedural laboratory examination: Secondary | ICD-10-CM | POA: Insufficient documentation

## 2022-01-22 DIAGNOSIS — Z01818 Encounter for other preprocedural examination: Secondary | ICD-10-CM

## 2022-01-22 DIAGNOSIS — E119 Type 2 diabetes mellitus without complications: Secondary | ICD-10-CM | POA: Insufficient documentation

## 2022-01-22 HISTORY — DX: Cardiac arrhythmia, unspecified: I49.9

## 2022-01-22 HISTORY — DX: Personal history of other diseases of the digestive system: Z87.19

## 2022-01-22 LAB — HEMOGLOBIN A1C
Hgb A1c MFr Bld: 5.5 % (ref 4.8–5.6)
Mean Plasma Glucose: 111.15 mg/dL

## 2022-01-22 LAB — BASIC METABOLIC PANEL
Anion gap: 6 (ref 5–15)
BUN: 6 mg/dL — ABNORMAL LOW (ref 8–23)
CO2: 28 mmol/L (ref 22–32)
Calcium: 8.4 mg/dL — ABNORMAL LOW (ref 8.9–10.3)
Chloride: 105 mmol/L (ref 98–111)
Creatinine, Ser: 0.74 mg/dL (ref 0.44–1.00)
GFR, Estimated: >60 mL/min (ref >60)
Glucose, Bld: 287 mg/dL — ABNORMAL HIGH (ref 70–99)
Potassium: 3.3 mmol/L — ABNORMAL LOW (ref 3.5–5.1)
Sodium: 139 mmol/L (ref 135–145)

## 2022-01-22 LAB — CBC
HCT: 39.4 % (ref 36.0–46.0)
Hemoglobin: 12 g/dL (ref 12.0–15.0)
MCH: 29.3 pg (ref 26.0–34.0)
MCHC: 30.5 g/dL (ref 30.0–36.0)
MCV: 96.3 fL (ref 80.0–100.0)
Platelets: 112 10*3/uL — ABNORMAL LOW (ref 150–400)
RBC: 4.09 MIL/uL (ref 3.87–5.11)
WBC: 4.5 10*3/uL (ref 4.0–10.5)
nRBC: 0 % (ref 0.0–0.2)

## 2022-01-22 LAB — SARS CORONAVIRUS 2 (TAT 6-24 HRS): SARS Coronavirus 2: NEGATIVE

## 2022-01-22 LAB — GLUCOSE, CAPILLARY: Glucose-Capillary: 307 mg/dL — ABNORMAL HIGH (ref 70–99)

## 2022-01-22 NOTE — Progress Notes (Signed)
PCP - Shawnie Dapper MD ?Cardiologist -  ? ?PPM/ICD - Denies ?Device Orders -  ?Rep Notified -  ? ?Chest x-ray - 09/12/06 ?EKG - 08/16/21 ?Stress Test - 07/2010 ?ECHO - 08/04/15 ?Cardiac Cath - no ? ?Sleep Study - 2016 ?CPAP - yes ? ?Fasting Blood Sugar - 98 ?Checks Blood Sugar three times a day ? ?Blood Thinner Instructions:Pt states she was instructed to stop Xarelto one week before surgery. Last dose was 4/21. ?Aspirin Instructions:n/a ? ?ERAS Protcol -no ?PRE-SURGERY Ensure or G2-  ? ?COVID TEST- 01/22/22 ? ? ?Anesthesia review: yes-cardiac history ? ?Patient denies shortness of breath, fever, cough and chest pain at PAT appointment ? ? ?All instructions explained to the patient, with a verbal understanding of the material. Patient agrees to go over the instructions while at home for a better understanding. Patient also instructed to wear a mask when out in publice after being tested for COVID-19. The opportunity to ask questions was provided. ?  ?

## 2022-01-23 NOTE — Anesthesia Preprocedure Evaluation (Addendum)
Anesthesia Evaluation  ?Patient identified by MRN, date of birth, ID band ?Patient awake ? ? ? ?Reviewed: ?Allergy & Precautions, NPO status , Patient's Chart, lab work & pertinent test results ? ?History of Anesthesia Complications ?(+) PONV and history of anesthetic complications ? ?Airway ?Mallampati: III ? ?TM Distance: >3 FB ?Neck ROM: Full ? ? ? Dental ?no notable dental hx. ? ?  ?Pulmonary ?sleep apnea ,  ?  ?Pulmonary exam normal ?breath sounds clear to auscultation ? ? ? ? ? ? Cardiovascular ?hypertension, Normal cardiovascular exam+ dysrhythmias Atrial Fibrillation  ?Rhythm:Regular Rate:Normal ? ? ?  ?Neuro/Psych ?PSYCHIATRIC DISORDERS Anxiety  Neuromuscular disease (neuropathic pain)   ? GI/Hepatic ?PUD, GERD  ,(+) Hepatitis - ([facility])Ulcerative colitis ?  ?Endo/Other  ?negative endocrine ROSdiabetes, Poorly Controlled, Type 2, Insulin Dependent ? Renal/GU ?negative Renal ROS  ?negative genitourinary ?  ?Musculoskeletal ? ?(+) Arthritis , Osteoarthritis,  Fibromyalgia -Walks with walker  ? Abdominal ?(+) + obese,   ?Peds ?negative pediatric ROS ?(+)  Hematology ? ?(+) Blood dyscrasia (thrombocytopenia), ,   ?Anesthesia Other Findings ? ? Reproductive/Obstetrics ?negative OB ROS ? ?  ? ? ? ? ? ? ? ? ? ? ? ? ? ?  ?  ? ? ? ? ? ? ?Anesthesia Physical ?Anesthesia Plan ? ?ASA: 3 ? ?Anesthesia Plan: General  ? ?Post-op Pain Management: Tylenol PO (pre-op)*  ? ?Induction: Intravenous ? ?PONV Risk Score and Plan: 4 or greater and Treatment may vary due to age or medical condition, Ondansetron and Dexamethasone ? ?Airway Management Planned: Oral ETT and Video Laryngoscope Planned ? ?Additional Equipment: None ? ?Intra-op Plan:  ? ?Post-operative Plan: Extubation in OR ? ?Informed Consent: I have reviewed the patients History and Physical, chart, labs and discussed the procedure including the risks, benefits and alternatives for the proposed anesthesia with the patient or authorized  representative who has indicated his/her understanding and acceptance.  ? ? ? ?Dental advisory given ? ?Plan Discussed with: CRNA, Anesthesiologist and Surgeon ? ?Anesthesia Plan Comments: (Glidescope available for intubation. GETA. Norton Blizzard, MD  ? ? ?PAT note by Karoline Caldwell, PA-C: ?[place] [facility]ology for history of paroxysmal atrial fibrillation, HLD, HTN.  Seen by Almyra Deforest, PA via televisit 12/31/21 for preop evaluation.  Per note, "Patient has upcoming nasal septum surgery with septoplasty, turbinate reduction and endoscopic nasal cautery. ?She denies any recent chest pain worsening shortness of breath. ?She recently underwent GI procedure without any issue. ?She has not had any recent palpitation. ?Patient is cleared to proceed with upcoming low risk procedure without further work-up. ?She will need to hold her Xarelto for 3 days prior to the procedure and restart as soon as?possible afterward at the surgeon's discretion." ? ?Patient reported her last dose of Xarelto was on 01/18/2022. ? ?History of severe OSA, on CPAP. ? 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  A1c 5.5 on preop labs. ? ?Preop labs reviewed, potassium mildly low at 3.3, glucose elevated at 287, otherwise unremarkable. ? ?TTE 08/04/2015: ?- Left ventricle: The cavity size was normal. Wall thickness was  ???increased in a pattern of moderate LVH. Systolic function was  ???normal. The estimated ejection fraction was in the range of 60%  ???to 65%. Wall motion was normal; there were no regional wall  ???motion abnormalities. Doppler parameters are consistent with  ???abnormal left ventricular relaxation (grade 1 diastolic  ???dysfunction).  ?- Aortic valve: Moderately calcified annulus. Trileaflet; mildly  ???thickened leaflets. Valve area (VTI): 2.21 cm^2. Valve area  ???(Vmax): 2.1 cm^2.  ?-  Mitral valve: Mildly calcified annulus. Mildly thickened leaflets  ???.  ?- Left atrium: The atrium was mildly dilated.  ?- Technically adequate study.  ? ?)   ? ? ? ? ?Anesthesia Quick Evaluation ? ?

## 2022-01-23 NOTE — Progress Notes (Signed)
Anesthesia Chart Review: ? ?[place] cardiology for history of paroxysmal atrial fibrillation, HLD, HTN.  Seen by Almyra Deforest, PA via televisit 12/31/21 for preop evaluation.  Per note, "Patient has upcoming nasal septum surgery with septoplasty, turbinate reduction and endoscopic nasal cautery.  She denies any recent chest pain worsening shortness of breath.  She recently underwent GI procedure without any issue.  She has not had any recent palpitation.  Patient is cleared to proceed with upcoming low risk procedure without further work-up.  She will need to hold her Xarelto for 3 days prior to the procedure and restart as soon as possible afterward at the surgeon's discretion." ? ?Patient reported her last dose of Xarelto was on 01/18/2022. ? ?History of severe OSA, on CPAP. ? 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  A1c 5.5 on preop labs. ? ?Preop labs reviewed, potassium mildly low at 3.3, glucose elevated at 287, otherwise unremarkable. ? ?TTE 08/04/2015: ?- Left ventricle: The cavity size was normal. Wall thickness was  ?  increased in a pattern of moderate LVH. Systolic function was  ?  normal. The estimated ejection fraction was in the range of 60%  ?  to 65%. Wall motion was normal; there were no regional wall  ?  motion abnormalities. Doppler parameters are consistent with  ?  abnormal left ventricular relaxation (grade 1 diastolic  ?  dysfunction).  ?- Aortic valve: Moderately calcified annulus. Trileaflet; mildly  ?  thickened leaflets. Valve area (VTI): 2.21 cm^2. Valve area  ?  (Vmax): 2.1 cm^2.  ?- Mitral valve: Mildly calcified annulus. Mildly thickened leaflets  ?  .  ?- Left atrium: The atrium was mildly dilated.  ?- Technically adequate study.  ? ? ? ?Karoline Caldwell, PA-C ?[facility]/Anesthesiology ?Phone [phone] ?01/23/2022 12:34 PM ? ?

## 2022-01-25 ENCOUNTER — Ambulatory Visit (HOSPITAL_BASED_OUTPATIENT_CLINIC_OR_DEPARTMENT_OTHER): Payer: Medicare PPO | Admitting: Anesthesiology

## 2022-01-25 ENCOUNTER — Encounter (HOSPITAL_COMMUNITY): Payer: Self-pay | Admitting: Otolaryngology

## 2022-01-25 ENCOUNTER — Ambulatory Visit (HOSPITAL_COMMUNITY)
Admission: RE | Admit: 2022-01-25 | Discharge: 2022-01-25 | Disposition: A | Discharge status: Home / Self Care | Payer: Medicare PPO | Attending: Otolaryngology | Admitting: Otolaryngology

## 2022-01-25 ENCOUNTER — Other Ambulatory Visit: Payer: Self-pay

## 2022-01-25 ENCOUNTER — Ambulatory Visit (HOSPITAL_COMMUNITY): Payer: Medicare PPO | Admitting: Physician Assistant

## 2022-01-25 ENCOUNTER — Encounter (HOSPITAL_COMMUNITY)
Admission: RE | Disposition: A | Discharge status: Home / Self Care | Payer: Self-pay | Source: Home / Self Care | Attending: Otolaryngology

## 2022-01-25 DIAGNOSIS — Z8711 Personal history of peptic ulcer disease: Secondary | ICD-10-CM | POA: Diagnosis not present

## 2022-01-25 DIAGNOSIS — E1165 Type 2 diabetes mellitus with hyperglycemia: Secondary | ICD-10-CM | POA: Insufficient documentation

## 2022-01-25 DIAGNOSIS — F411 Generalized anxiety disorder: Secondary | ICD-10-CM | POA: Insufficient documentation

## 2022-01-25 DIAGNOSIS — D696 Thrombocytopenia, unspecified: Secondary | ICD-10-CM | POA: Diagnosis not present

## 2022-01-25 DIAGNOSIS — K5190 Ulcerative colitis, unspecified, without complications: Secondary | ICD-10-CM | POA: Diagnosis not present

## 2022-01-25 DIAGNOSIS — K219 Gastro-esophageal reflux disease without esophagitis: Secondary | ICD-10-CM | POA: Insufficient documentation

## 2022-01-25 DIAGNOSIS — K7581 Nonalcoholic steatohepatitis (NASH): Secondary | ICD-10-CM | POA: Insufficient documentation

## 2022-01-25 DIAGNOSIS — M797 Fibromyalgia: Secondary | ICD-10-CM | POA: Insufficient documentation

## 2022-01-25 DIAGNOSIS — R040 Epistaxis: Secondary | ICD-10-CM | POA: Insufficient documentation

## 2022-01-25 DIAGNOSIS — Z7985 Long-term (current) use of injectable non-insulin antidiabetic drugs: Secondary | ICD-10-CM | POA: Insufficient documentation

## 2022-01-25 DIAGNOSIS — E785 Hyperlipidemia, unspecified: Secondary | ICD-10-CM | POA: Diagnosis not present

## 2022-01-25 DIAGNOSIS — Z794 Long term (current) use of insulin: Secondary | ICD-10-CM | POA: Diagnosis not present

## 2022-01-25 DIAGNOSIS — I480 Paroxysmal atrial fibrillation: Secondary | ICD-10-CM | POA: Insufficient documentation

## 2022-01-25 DIAGNOSIS — I10 Essential (primary) hypertension: Secondary | ICD-10-CM | POA: Diagnosis not present

## 2022-01-25 DIAGNOSIS — J343 Hypertrophy of nasal turbinates: Secondary | ICD-10-CM | POA: Insufficient documentation

## 2022-01-25 DIAGNOSIS — G4733 Obstructive sleep apnea (adult) (pediatric): Secondary | ICD-10-CM | POA: Diagnosis not present

## 2022-01-25 DIAGNOSIS — K759 Inflammatory liver disease, unspecified: Secondary | ICD-10-CM | POA: Diagnosis not present

## 2022-01-25 DIAGNOSIS — E1140 Type 2 diabetes mellitus with diabetic neuropathy, unspecified: Secondary | ICD-10-CM | POA: Insufficient documentation

## 2022-01-25 DIAGNOSIS — J342 Deviated nasal septum: Secondary | ICD-10-CM | POA: Diagnosis not present

## 2022-01-25 DIAGNOSIS — M1990 Unspecified osteoarthritis, unspecified site: Secondary | ICD-10-CM | POA: Insufficient documentation

## 2022-01-25 DIAGNOSIS — J3489 Other specified disorders of nose and nasal sinuses: Secondary | ICD-10-CM | POA: Diagnosis not present

## 2022-01-25 HISTORY — PX: NASAL ENDOSCOPY WITH EPISTAXIS CONTROL: SHX5664

## 2022-01-25 HISTORY — PX: NASAL SEPTOPLASTY W/ TURBINOPLASTY: SHX2070

## 2022-01-25 LAB — COMPREHENSIVE METABOLIC PANEL WITH GFR
ALT: 35 U/L (ref 0–44)
AST: 53 U/L — ABNORMAL HIGH (ref 15–41)
Albumin: 3.1 g/dL — ABNORMAL LOW (ref 3.5–5.0)
Alkaline Phosphatase: 60 U/L (ref 38–126)
Anion gap: 6 (ref 5–15)
BUN: 8 mg/dL (ref 8–23)
CO2: 27 mmol/L (ref 22–32)
Calcium: 8.4 mg/dL — ABNORMAL LOW (ref 8.9–10.3)
Chloride: 103 mmol/L (ref 98–111)
Creatinine, Ser: 0.79 mg/dL (ref 0.44–1.00)
GFR, Estimated: >60 mL/min (ref >60)
Glucose, Bld: 184 mg/dL — ABNORMAL HIGH (ref 70–99)
Potassium: 3.6 mmol/L (ref 3.5–5.1)
Sodium: 136 mmol/L (ref 135–145)
Total Bilirubin: 1 mg/dL (ref 0.3–1.2)
Total Protein: 6.6 g/dL (ref 6.5–8.1)

## 2022-01-25 LAB — GLUCOSE, CAPILLARY
Glucose-Capillary: 177 mg/dL — ABNORMAL HIGH (ref 70–99)
Glucose-Capillary: 152 mg/dL — ABNORMAL HIGH (ref 70–99)

## 2022-01-25 SURGERY — SEPTOPLASTY, NOSE, WITH NASAL TURBINATE REDUCTION
Anesthesia: General | Laterality: Bilateral

## 2022-01-25 MED ORDER — ONDANSETRON HCL 4 MG/2ML IJ SOLN
INTRAMUSCULAR | Status: DC | PRN
Start: 2022-01-25 — End: 2022-01-25
  Administered 2022-01-25: 4 mg via INTRAVENOUS

## 2022-01-25 MED ORDER — MUPIROCIN 2 % EX OINT
TOPICAL_OINTMENT | CUTANEOUS | Status: DC | PRN
  Administered 2022-01-25: 1 via NASAL

## 2022-01-25 MED ORDER — LIDOCAINE 2% (20 MG/ML) 5 ML SYRINGE
INTRAMUSCULAR | Status: AC
  Filled 2022-01-25: qty 5

## 2022-01-25 MED ORDER — FENTANYL CITRATE (PF) 250 MCG/5ML IJ SOLN
INTRAMUSCULAR | Status: AC
  Filled 2022-01-25: qty 5

## 2022-01-25 MED ORDER — EPHEDRINE SULFATE-NACL 50-0.9 MG/10ML-% IV SOSY
PREFILLED_SYRINGE | INTRAVENOUS | Status: DC | PRN
  Administered 2022-01-25: 2.5 mg via INTRAVENOUS

## 2022-01-25 MED ORDER — LIDOCAINE-EPINEPHRINE 1 %-1:100000 IJ SOLN
INTRAMUSCULAR | Status: DC | PRN
  Administered 2022-01-25: 7 mL

## 2022-01-25 MED ORDER — CHLORHEXIDINE GLUCONATE 0.12 % MT SOLN: 15.0000 mL | Freq: Once | OROMUCOSAL | Status: AC

## 2022-01-25 MED ORDER — OXYMETAZOLINE HCL 0.05 % NA SOLN
NASAL | Status: DC | PRN
  Administered 2022-01-25: 1

## 2022-01-25 MED ORDER — PROPOFOL 10 MG/ML IV BOLUS
INTRAVENOUS | Status: AC
  Filled 2022-01-25: qty 20

## 2022-01-25 MED ORDER — LACTATED RINGERS IV SOLN: INTRAVENOUS | Status: DC

## 2022-01-25 MED ORDER — DEXAMETHASONE SODIUM PHOSPHATE 10 MG/ML IJ SOLN
INTRAMUSCULAR | Status: DC | PRN
  Administered 2022-01-25: 4 mg via INTRAVENOUS

## 2022-01-25 MED ORDER — ROCURONIUM BROMIDE 10 MG/ML (PF) SYRINGE
PREFILLED_SYRINGE | INTRAVENOUS | Status: AC
  Filled 2022-01-25: qty 10

## 2022-01-25 MED ORDER — FENTANYL CITRATE (PF) 100 MCG/2ML IJ SOLN: 25.0000 ug | INTRAMUSCULAR | Status: DC | PRN

## 2022-01-25 MED ORDER — LIDOCAINE-EPINEPHRINE 1 %-1:100000 IJ SOLN
INTRAMUSCULAR | Status: AC
  Filled 2022-01-25: qty 1

## 2022-01-25 MED ORDER — ROCURONIUM BROMIDE 10 MG/ML (PF) SYRINGE
PREFILLED_SYRINGE | INTRAVENOUS | Status: DC | PRN
Start: 2022-01-25 — End: 2022-01-25
  Administered 2022-01-25: 50 mg via INTRAVENOUS

## 2022-01-25 MED ORDER — DEXAMETHASONE SODIUM PHOSPHATE 10 MG/ML IJ SOLN
INTRAMUSCULAR | Status: AC
  Filled 2022-01-25: qty 1

## 2022-01-25 MED ORDER — CEFAZOLIN SODIUM 1 G IJ SOLR
INTRAMUSCULAR | Status: AC
  Filled 2022-01-25: qty 20

## 2022-01-25 MED ORDER — CEPHALEXIN 500 MG PO CAPS
500.0000 mg | ORAL_CAPSULE | Freq: Three times a day (TID) | ORAL | 0 refills | Status: AC
Start: 2022-01-25 — End: 2022-02-01

## 2022-01-25 MED ORDER — INSULIN ASPART 100 UNIT/ML IJ SOLN: 0.0000 [IU] | INTRAMUSCULAR | Status: DC | PRN

## 2022-01-25 MED ORDER — FENTANYL CITRATE (PF) 250 MCG/5ML IJ SOLN
INTRAMUSCULAR | Status: DC | PRN
  Administered 2022-01-25 (×2): 50 ug via INTRAVENOUS

## 2022-01-25 MED ORDER — CEFAZOLIN SODIUM-DEXTROSE 2-3 GM-%(50ML) IV SOLR
INTRAVENOUS | Status: DC | PRN
  Administered 2022-01-25: 2 g via INTRAVENOUS

## 2022-01-25 MED ORDER — 0.9 % SODIUM CHLORIDE (POUR BTL) OPTIME
TOPICAL | Status: DC | PRN
  Administered 2022-01-25: 1000 mL

## 2022-01-25 MED ORDER — ORAL CARE MOUTH RINSE
15.0000 mL | Freq: Once | OROMUCOSAL | Status: AC
  Administered 2022-01-25: 15 mL via OROMUCOSAL

## 2022-01-25 MED ORDER — OXYMETAZOLINE HCL 0.05 % NA SOLN
NASAL | Status: AC
  Filled 2022-01-25: qty 30

## 2022-01-25 MED ORDER — ONDANSETRON HCL 4 MG/2ML IJ SOLN
INTRAMUSCULAR | Status: AC
  Filled 2022-01-25: qty 2

## 2022-01-25 MED ORDER — OXYMETAZOLINE HCL 0.05 % NA SOLN
NASAL | Status: DC | PRN
  Administered 2022-01-25: 2 via NASAL

## 2022-01-25 MED ORDER — LIDOCAINE 2% (20 MG/ML) 5 ML SYRINGE
INTRAMUSCULAR | Status: DC | PRN
  Administered 2022-01-25: 60 mg via INTRAVENOUS

## 2022-01-25 MED ORDER — AMISULPRIDE (ANTIEMETIC) 5 MG/2ML IV SOLN: 10.0000 mg | Freq: Once | INTRAVENOUS | Status: DC | PRN

## 2022-01-25 MED ORDER — PROPOFOL 10 MG/ML IV BOLUS
INTRAVENOUS | Status: DC | PRN
  Administered 2022-01-25: 150 mg via INTRAVENOUS

## 2022-01-25 MED ORDER — ONDANSETRON HCL 4 MG/2ML IJ SOLN: 4.0000 mg | Freq: Once | INTRAMUSCULAR | Status: DC | PRN

## 2022-01-25 MED ORDER — EPHEDRINE 5 MG/ML INJ
INTRAVENOUS | Status: AC
  Filled 2022-01-25: qty 5

## 2022-01-25 MED ORDER — SUGAMMADEX SODIUM 200 MG/2ML IV SOLN
INTRAVENOUS | Status: DC | PRN
  Administered 2022-01-25: 300 mg via INTRAVENOUS

## 2022-01-25 MED ORDER — MUPIROCIN CALCIUM 2 % EX CREA
TOPICAL_CREAM | CUTANEOUS | Status: AC
  Filled 2022-01-25: qty 15

## 2022-01-25 MED ORDER — ACETAMINOPHEN 500 MG PO TABS
1000.0000 mg | ORAL_TABLET | Freq: Once | ORAL | Status: AC
  Administered 2022-01-25: 1000 mg via ORAL
  Filled 2022-01-25: qty 2

## 2022-01-25 SURGICAL SUPPLY — 26 items
BAG COUNTER SPONGE SURGICOUNT (BAG) ×2 IMPLANT
BAG SPNG CNTER NS LX DISP (BAG) ×1
CANISTER SUCT 3000ML PPV (MISCELLANEOUS) ×2 IMPLANT
COAGULATOR SUCT SWTCH 10FR 6 (ELECTROSURGICAL) ×1 IMPLANT
DRAPE HALF SHEET 40X57 (DRAPES) IMPLANT
ELECT REM PT RETURN 9FT ADLT (ELECTROSURGICAL)
ELECTRODE REM PT RTRN 9FT ADLT (ELECTROSURGICAL) IMPLANT
GAUZE SPONGE 2X2 8PLY STRL LF (GAUZE/BANDAGES/DRESSINGS) ×1 IMPLANT
GLOVE BIOGEL M 7.0 STRL (GLOVE) ×4 IMPLANT
GOWN STRL REUS W/ TWL LRG LVL3 (GOWN DISPOSABLE) ×2 IMPLANT
GOWN STRL REUS W/TWL LRG LVL3 (GOWN DISPOSABLE) ×4
KIT BASIN OR (CUSTOM PROCEDURE TRAY) ×2 IMPLANT
KIT TURNOVER KIT B (KITS) ×2 IMPLANT
NDL HYPO 25GX1X1/2 BEV (NEEDLE) ×1 IMPLANT
NEEDLE HYPO 25GX1X1/2 BEV (NEEDLE) ×2 IMPLANT
NS IRRIG 1000ML POUR BTL (IV SOLUTION) ×2 IMPLANT
PAD ARMBOARD 7.5X6 YLW CONV (MISCELLANEOUS) ×2 IMPLANT
SPLINT NASAL DOYLE BI-VL (GAUZE/BANDAGES/DRESSINGS) ×2 IMPLANT
SPONGE GAUZE 2X2 STER 10/PKG (GAUZE/BANDAGES/DRESSINGS) ×1
SPONGE NEURO XRAY DETECT 1X3 (DISPOSABLE) ×2 IMPLANT
SUT ETHILON 3 0 PS 1 (SUTURE) ×2 IMPLANT
SUT PLAIN 4 0 ~~LOC~~ 1 (SUTURE) ×2 IMPLANT
TOWEL GREEN STERILE FF (TOWEL DISPOSABLE) ×2 IMPLANT
TRAY ENT MC OR (CUSTOM PROCEDURE TRAY) ×2 IMPLANT
TUBE SALEM SUMP 16 FR W/ARV (TUBING) ×2 IMPLANT
TUBING EXTENTION W/L.L. (IV SETS) ×2 IMPLANT

## 2022-01-25 NOTE — Transfer of Care (Signed)
Immediate Anesthesia Transfer of Care Note ? ?Patient: [name] ? ?Procedure(s) Performed: NASAL SEPTOPLASTY WITH TURBINATE REDUCTION (Bilateral) ?NASAL ENDOSCOPY WITH EPISTAXIS CONTROL (Bilateral) ? ?Patient Location: PACU ? ?Anesthesia Type:General ? ?Level of Consciousness: awake, alert  and oriented ? ?Airway & Oxygen Therapy: Patient Spontanous Breathing and Patient connected to face mask oxygen ? ?Post-op Assessment: Report given to RN and Post -op Vital signs reviewed and stable ? ?Post vital signs: Reviewed and stable ? ?Last Vitals:  ?Vitals Value Taken Time  ?BP    ?Temp    ?Pulse    ?Resp    ?SpO2    ? ? ?Last Pain:  ?Vitals:  ? 01/25/22 0615  ?TempSrc:   ?PainSc: 0-No pain  ?   ? ?  ? ?Complications: No notable events documented. ?

## 2022-01-25 NOTE — H&P (Signed)
[name] is an 76 y.o. female.   ?Chief Complaint: nasal obstruction  ?HPI: deviated septum and epistaxis ? ?Past Medical History:  ?Diagnosis Date  ? 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 recurrent  ? 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 NASH--> early cirrhotic changes on ultrasound 07/2018. ? to get liver bx if she gets bariatric surgery? Mild portal hypertensive gastropathy on EGD 08/2019.  ? Dysrhythmia   ? Fibromyalgia   ? GAD (generalized anxiety disorder)   ? GERD   ? History of hiatal hernia   ? History of iron deficiency anemia 12/2018  ? Possibly inadequate absorption secondary to chronic/long term PPI therapy + portal hypertensive gastroduodenopathy. No GIB found on EGD, colonosc, and givens. Iron infusion approx 02/2021. [place] [facility].  ? History of thrombocytopenia 12/2011  ? Hyperlipidemia   ? Intolerant of statins  ? HYPERTENSION   ? IBS (irritable bowel syndrome)   ? -D.  Good response to bentyl and imodium as of 06/2018 GI f/u.  ? IDDM (insulin dependent diabetes mellitus)   ? with DPN (managed by Dr. Cruzita Lederer but then in 2018 pt preferred to have me manage for her convenience)  ? Limited mobility   ? Requires a walker for arthritic pain, widespread musculoskeletal pain, and neuropathic pain.  ? Morbid obesity ([place])   ? As of 11/2018, pt considering sleeve gastectomy vs bipass as of eval by Dr. De Burrs considering as of 12/2019.  ? Nonalcoholic steatohepatitis   ? Viral Hep screens NEG.  CT 2015.  Transaminasemia.  U/S 07/2018 showed early changes of cirrhosis.  ? OSA (obstructive sleep apnea) 09/14/2015  ? sleep study 09/07/15: severe obstructive sleep apnea with an AHI of 72 and SaO2 low of 75%.>refer to sleep med for eval and tx options  ? Osteoarthritis   ? hips, shoulders, knees  ? PAF (paroxysmal atrial fibrillation) ([place])   ? One documented episode (after getting EGD 2016).  Was on amiodarone x 3 mo.  Rate control with metoprolol + anticoag with xarelto.   ? PONV  (postoperative nausea and vomiting)   ? Recurrent epistaxis   ? Granuloma in L nare cauterized by ENT 04/2020. Another cautery 06/2020  ? Small fiber neuropathy   ? Due to DM.  Symmetric hands and feet tingling/numbness.  ? Ulcerative colitis ([place])   ? Remicade infusion Q 8 weeks: in clinical and endoscopic remission as of 12/2018 GI f/u.  06/11/19 rpt colonoscopy->cecal and ascending colon colitis.  ? ? ?Past Surgical History:  ?Procedure Laterality Date  ? ABDOMINAL HYSTERECTOMY  1980  ? Paps no longer indicated.  ? BACK SURGERY    ? BACTERIAL OVERGROWTH TEST N/A 07/13/2015  ? Procedure: BACTERIAL OVERGROWTH TEST;  Surgeon: Rogene Houston, MD;  [facility];  Service: Endoscopy;  Laterality: N/A;  730 ? ?  ? BILATERAL SALPINGOOPHORECTOMY  02/10/2001  ? BIOPSY  06/11/2019  ? Procedure: BIOPSY;  Surgeon: Rogene Houston, MD;  [facility];  Service: Endoscopy;;  colon  ? BIOPSY  09/27/2019  ? Procedure: BIOPSY;  Surgeon: Rogene Houston, MD;  [facility];  Service: Endoscopy;;  gastric ?duodenal  ? BIOPSY  09/15/2020  ? Procedure: BIOPSY;  Surgeon: Montez Morita, Quillian Quince, MD;  [facility];  Service: Gastroenterology;;  ? [place]  ? bilat  ? BREAST SURGERY    ? CARDIOVASCULAR STRESS TEST  07/2010  ? Lexiscan myoview: normal  ? CARPAL TUNNEL  RELEASE Right 1996  ? CARPAL TUNNEL RELEASE Left 03/21/2003  ? CARPAL TUNNEL RELEASE Right 05/04/2013  ? Procedure: CARPAL TUNNEL RELEASE;  Surgeon: Cammie Sickle., MD;  Location: [facility];  Service: Orthopedics;  Laterality: Right;  ? CARPAL TUNNEL RELEASE Left 09/21/2013  ? Procedure: LEFT CARPAL TUNNEL RELEASE;  Surgeon: Cammie Sickle., MD;  Location: [facility];  Service: Orthopedics;  Laterality: Left;  ? CHOLECYSTECTOMY    ? COLONOSCOPY WITH PROPOFOL N/A 08/04/2015  ? Colitis in remission.  No polyps.  Procedure: COLONOSCOPY WITH PROPOFOL;  Surgeon: Rogene Houston, MD;  [facility];  Service: Endoscopy;  Laterality: N/A;  cecum time in  0820   time out  0827    total time 7 minutes  ? COLONOSCOPY WITH PROPOFOL N/A 06/11/2019  ? cecal and ascending colon colitis.  Procedure: COLONOSCOPY WITH PROPOFOL;  Surgeon: Rogene Houston, MD;  [facility];  Service: Endoscopy;  Laterality: N/A;  730a  ? COLONOSCOPY WITH PROPOFOL N/A 09/15/2020  ? Procedure: COLONOSCOPY WITH PROPOFOL;  Surgeon: Harvel Quale, MD;  [facility];  Service: Gastroenterology;  Laterality: N/A;  1030  ? ESOPHAGEAL DILATION N/A 08/04/2015  ? Procedure: ESOPHAGEAL DILATION;  Surgeon: Rogene Houston, MD;  [facility];  Service: Endoscopy;  Laterality: N/A;  [place] 57, no mucousal disruption  ? ESOPHAGOGASTRODUODENOSCOPY  09/27/2019  ? Performed for IDA.  Esoph dilation was done but no stricture present.  Mild portal hypertensive gastropathy, o/w normal.  Duodenal bx NEG.  h pylori neg.  ? ESOPHAGOGASTRODUODENOSCOPY (EGD) WITH ESOPHAGEAL DILATION  12/02/2005  ? ESOPHAGOGASTRODUODENOSCOPY (EGD) WITH PROPOFOL N/A 08/04/2015  ? Procedure: ESOPHAGOGASTRODUODENOSCOPY (EGD) WITH PROPOFOL;  Surgeon: Rogene Houston, MD;  [facility];  Service: Endoscopy;  Laterality: N/A;  procedure 1  ? ESOPHAGOGASTRODUODENOSCOPY (EGD) WITH PROPOFOL N/A 09/27/2019  ? Procedure: ESOPHAGOGASTRODUODENOSCOPY (EGD) WITH PROPOFOL;  Surgeon: Rogene Houston, MD;  [facility];  Service: Endoscopy;  Laterality: N/A;  12:10  ? ESOPHAGOGASTRODUODENOSCOPY (EGD) WITH PROPOFOL N/A 10/31/2021  ? Procedure: ESOPHAGOGASTRODUODENOSCOPY (EGD) WITH PROPOFOL;  Surgeon: Rogene Houston, MD;  [facility];  Service: Endoscopy;  Laterality: N/A;  930  ? FLEXIBLE SIGMOIDOSCOPY  01/17/2012  ? Procedure: FLEXIBLE SIGMOIDOSCOPY;  Surgeon: Rogene Houston, MD;  [facility];  Service: Endoscopy;  Laterality: N/A;  ? GIVENS CAPSULE STUDY N/A 08/03/2019  ? Procedure:  GIVENS CAPSULE STUDY (performed for IDA)->some food debris in stomach and small amount of blood.  Surgeon: Rogene Houston, MD;  [facility];  Service: Endoscopy;  Laterality: N/A;  730AM  ? HEMILAMINOTOMY LUMBAR SPINE Bilateral 09/07/1999  ? L4-5  ? KNEE ARTHROSCOPY Right 01/1999; 10/2000  ? LYSIS OF ADHESION  02/10/2001  ? MALONEY DILATION  09/27/2019  ? Procedure: MALONEY DILATION;  Surgeon: Rogene Houston, MD;  [facility];  Service: Endoscopy;;  ? [place]; 09/12/2006  ? TARSAL TUNNEL RELEASE  2002  ? TRANSTHORACIC ECHOCARDIOGRAM  08/04/2015  ? EF 60-65%, normal wall motion, mild LVH, mild LA dilation, grd I DD.  ? TUMOR EXCISION Left 03/21/2003  ? dorsal 1st web space (hand)  ? URETEROLYSIS Right 02/10/2001  ? ? ?Family History  ?Problem Relation Age of Onset  ? Diabetes Mother   ? Hypertension Mother   ? Heart attack Father   ?     Mid 60's  ? Heart disease Father   ?  Lung disease Father   ?     spot on lung; had lung surgery  ? Alcohol abuse Other   ? Hypertension Son   ? Diabetes Son   ? Emphysema Maternal Grandfather   ? Asthma Maternal Grandfather   ? Colon cancer Paternal Grandfather   ? Stomach cancer Paternal Grandmother   ? Neuropathy Neg Hx   ? ?Social History:  reports that she has never smoked. She has never used smokeless tobacco. She reports that she does not currently use alcohol. She reports that she does not use drugs. ? ?Allergies:  ?Allergies  ?Allergen Reactions  ? Omeprazole Anaphylaxis and Swelling  ?  SWELLING OF TONGUE AND THROAT  ? Actos [Pioglitazone] Swelling and Other (See Comments)  ?  Weight gain, tongue swelling ?  ? Benzocaine-Menthol Swelling  ?  SWELLING OF MOUTH  ? Colesevelam Other (See Comments)  ?  GI UPSET  ? Flagyl [Metronidazole Hcl] Other (See Comments)  ?  DIAPHORESIS  ? Metformin And Related Diarrhea  ? Shrimp [Shellfish Allergy] Itching  ?  OF THROAT AND EARS, if consumed raw  ? Statins Palpitations  ? Allevyn Adhesive [Wound  Dressings]   ?  Other reaction(s): Unknown  ? Desipramine Hcl Itching, Nausea Only and Other (See Comments)  ?  "swimmy" headed, ears itched ?  ? Hydromorphone Itching  ? Jardiance [Empagliflozin] Other (See Comments)

## 2022-01-25 NOTE — Op Note (Signed)
Operative Note: SEPTOPLASTY AND INFERIOR TURBINATE REDUCTION ? ?Patient: [name] ? ?Medical record number: [id number] ? ?Date:01/25/2022 ? ?Pre-operative Indications: 1. Deviated nasal septum with nasal airway obstruction ?    2.  Bilateral inferior turbinate hypertrophy ?    3. Recurrent epistaxis ? ?Postoperative Indications: Same ? ?Surgical Procedure: 1.  Nasal Septoplasty ?   2.  Bilateral Inferior Turbinate Reduction ?   3.  Endoscopic nasal cautery ? ?Anesthesia: GET ? ?Surgeon: Delsa Bern, M.D. ? ?Complications: None ? ?EBL: 100 cc ? ?Findings: Severely deviated nasal septum with airway obstruction and bilateral inferior turbinate hypertrophy. ? ? ?Brief History: ?The patient is a 76 y.o. female with a history of progressive nasal airway obstruction. The patient has been on medical therapy to reduce nasal mucosal edema including saline nasal spray and topical nasal steroids. Despite appropriate medical therapy the patient continues to have ongoing symptoms. Given the patient's history and findings, the above surgical procedures were recommended, risks and benefits were discussed in detail with the patient may understand and agree with our plan for surgery which is scheduled at [facility] under general anesthesia as an outpatient. ? ?Surgical Procedure: ?The patient is brought to the operating room on 01/25/2022 and placed in supine position on the operating table. General endotracheal anesthesia was established without difficulty. When the patient was adequately anesthetized, surgical timeout was performed and correct identification of the patient and the surgical procedure. The patient's nose was then injected with 7 cc of 1% lidocaine 1:100,000 dilution epinephrine which was injected in a submucosal fashion. The patient's nose was then packed with Afrin-soaked cottonoid pledgets were left in place for approximately 10 minutes lateral vasoconstriction and hemostasis. ? ?Surgical procedure was  begun with bilateral 0 degree nasal endoscopy.  The patient has a history of significant recurrent epistaxis primarily from the left nasal passageway and has an excoriation/ulcer in the left anterior nostril along the nasal septum.  Endoscopy showed no evidence of mass, tumor or vascular abnormality.  Several areas of prominent submucosal blood vessel were cauterized with monopolar suction cautery at 15 W.  No significant active bleeding. ? ?With the patient prepped draped and prepared for surgery, nasal septoplasty was begun.  A left anterior hemitransfixion incision was created and a mucoperichondrial flap was elevated from anterior to posterior on the left-hand side. The anterior cartilaginous septum was crossed at the midline and a mucoperichondrial flap was elevated on the patient's right.  Swivel knife was then used to resect the anterior and mid cartilaginous portion of the nasal septum.  Resected cartilage was morcellized and returned to the mucoperichondrial pocket at the occlusion of the surgical procedure.  Dissection was then carried out from anterior to posterior removing deviated bone and cartilage including a large septal spur the overlying mucosa was preserved.  With the septum brought to good midline position, the morselized cartilage was returned to the mucoperichondrial pocket and the soft tissue/mucosal flaps were reapproximated with interrupted 4-0 gut suture on a Keith needle in a horizontal mattressing fashion.  Anterior hemitransfixion incision was closed with the same stitch.  Bilateral Doyle nasal septal splints were then placed after the application of Bactroban ointment and sutured in position with a 3-0 Ethilon suture. ? ?Attention was then turned to the inferior turbinates, bilateral inferior turbinate intramural cautery was performed with cautery setting at 48 W.  2 submucosal passes were made in each inferior turbinate.  After completing cautery, anterior vertical incisions were  created and overlying soft  tissue was elevated, a small amount of turbinate bone was resected.  The turbinates were then outfractured to create a more patent nasal passageway. ? ?Surgical sponge count was correct. An oral gastric tube was passed and the stomach contents were aspirated. Patient was awakened from anesthetic and transferred from the operating room to the recovery room in stable condition. There were no complications and blood loss was 100cc. ? ? ?Delsa Bern, M.D. ?[facility] ?01/25/2022  ?

## 2022-01-25 NOTE — Anesthesia Procedure Notes (Signed)
Procedure Name: Intubation ?Date/Time: 01/25/2022 7:50 AM ?Performed by: Inda Coke, CRNA ?Pre-anesthesia Checklist: Patient identified, Emergency Drugs available, Suction available and Patient being monitored ?Patient Re-evaluated:Patient Re-evaluated prior to induction ?Oxygen Delivery Method: Circle System Utilized ?Preoxygenation: Pre-oxygenation with 100% oxygen ?Induction Type: IV induction ?Ventilation: Mask ventilation without difficulty and Oral airway inserted - appropriate to patient size ?Laryngoscope Size: Mac and 3 ?Grade View: Grade I ?Tube type: Oral ?Tube size: 7.0 mm ?Number of attempts: 1 ?Airway Equipment and Method: Stylet and Oral airway ?Placement Confirmation: ETT inserted through vocal cords under direct vision, positive ETCO2 and breath sounds checked- equal and bilateral ?Secured at: 22 cm ?Tube secured with: Tape ?Dental Injury: Teeth and Oropharynx as per pre-operative assessment  ? ? ? ? ?

## 2022-01-27 NOTE — Anesthesia Postprocedure Evaluation (Signed)
Anesthesia Post Note ? ?Patient: [name] ? ?Procedure(s) Performed: NASAL SEPTOPLASTY WITH TURBINATE REDUCTION (Bilateral) ?NASAL ENDOSCOPY WITH EPISTAXIS CONTROL (Bilateral) ? ?  ? ?Patient location during evaluation: PACU ?Anesthesia Type: General ?Level of consciousness: awake ?Pain management: pain level controlled ?Vital Signs Assessment: post-procedure vital signs reviewed and stable ?Respiratory status: spontaneous breathing and respiratory function stable ?Cardiovascular status: stable ?Postop Assessment: no apparent nausea or vomiting ?Anesthetic complications: no ? ? ?No notable events documented. ? ?Last Vitals:  ?Vitals:  ? 01/25/22 0915 01/25/22 0930  ?BP: (!) 142/71 (!) 158/64  ?Pulse: 72 68  ?Resp: 14 17  ?Temp:  37.1 ?C  ?SpO2: 91% 93%  ?  ?Last Pain:  ?Vitals:  ? 01/25/22 0930  ?TempSrc:   ?PainSc: 0-No pain  ? ? ?  ?  ?  ?  ?  ?  ? ?Merlinda Frederick ? ? ? ? ?

## 2022-01-28 ENCOUNTER — Encounter (HOSPITAL_COMMUNITY): Payer: Self-pay | Admitting: Otolaryngology

## 2022-02-11 ENCOUNTER — Other Ambulatory Visit: Payer: Self-pay | Admitting: Cardiology

## 2022-02-20 ENCOUNTER — Ambulatory Visit: Payer: Medicare PPO | Admitting: Family Medicine

## 2022-02-20 ENCOUNTER — Encounter: Payer: Self-pay | Admitting: Family Medicine

## 2022-02-20 VITALS — BP 111/67 | HR 80 | Temp 97.7°F | Ht 60.0 in | Wt 226.2 lb

## 2022-02-20 DIAGNOSIS — Z794 Long term (current) use of insulin: Secondary | ICD-10-CM | POA: Diagnosis not present

## 2022-02-20 DIAGNOSIS — D508 Other iron deficiency anemias: Secondary | ICD-10-CM | POA: Diagnosis not present

## 2022-02-20 DIAGNOSIS — I10 Essential (primary) hypertension: Secondary | ICD-10-CM

## 2022-02-20 DIAGNOSIS — E1140 Type 2 diabetes mellitus with diabetic neuropathy, unspecified: Secondary | ICD-10-CM

## 2022-02-20 MED ORDER — FUROSEMIDE 40 MG PO TABS: 40.0000 mg | ORAL_TABLET | Freq: Every day | ORAL | 1 refills | Status: DC

## 2022-02-20 MED ORDER — POTASSIUM CHLORIDE CRYS ER 10 MEQ PO TBCR: 10.0000 meq | EXTENDED_RELEASE_TABLET | Freq: Every day | ORAL | 0 refills | Status: DC

## 2022-02-20 MED ORDER — BD PEN NEEDLE MINI U/F 31G X 5 MM MISC: 5 refills | Status: DC

## 2022-02-20 MED ORDER — ACCU-CHEK SOFTCLIX LANCETS MISC: 1 refills | Status: DC

## 2022-02-20 MED ORDER — ACCU-CHEK GUIDE VI STRP: ORAL_STRIP | 1 refills | Status: DC

## 2022-02-20 NOTE — Progress Notes (Signed)
OFFICE VISIT  02/20/2022  CC:  Chief Complaint  Patient presents with   Diabetes    Pt is not fasting    Patient is a 76 y.o. female who presents for 3 mo f/u DM, HTN, and IDA. 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  She is on maximum tolerated dose of gabapentin.  I think any additional peripheral neuropathy medications would only add side effects. Continue with best attempts at glucose control. Hemoglobin A1c today.  2.  Hypertension, well controlled on Toprol-XL 100 mg a day.  I do not think the initial heart rate taken today by our machine was accurate.  Heart rate by me was 65-70 and it is always been around 70 in the office and patient says this is what it is at home typically. Electrolytes and creatinine today.   #3 iron deficiency anemia--she has some decreased absorption due to portal hypertensive gastropathy and she has history of recurrent nosebleeds. Plan is for nasal septum surgery per ENT. She does not tolerate oral iron.  She does not really comply well with her MVI with iron. Monitoring CBC and iron today.   #4 hypokalemia.  Her potassium was 3.1 back on September 04, 2021 and October 30, 2021.  She states she was prescribed 20 mEq/d of potassium supplement by Dr. Laural [name]. She does take 40 mg of Lasix daily. Checking potassium level today.   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 Portal hypertensive gastropathy on recent EGD.  No varices. Patient to get abdominal ultrasound and liver Doppler tomorrow."  INTERIM HX:  She is feeling well. She got nasal septal/turbinate surgery since I last saw her.  No further problems with nosebleeds. She did get iron infusion since I last saw her.  Taking 110 units insulin U-500 before every meal.  She will take approximately 30 units around bedtime if her glucose is greater than 200.  Reviewed home glucoses.  Fastings average around 150 rare checked during the daytime.  At  bedtime average 1 60-1 80.  Only occasional hypoglycemia.  No home blood pressure monitoring data available today.  Regarding her cirrhosis, ultrasound liver Doppler showed nodular hepatic contours consistent with cirrhosis and she had patent portal vein with appropriate directional flow. A one-point centimeter left liver mass was slightly larger when compared to prior ultrasound.  A follow-up MR liver with and without contrast showed hepatic cirrhosis but no evidence of hepatic neoplasm or other significant abnormality.  ROS as above, plus--> no fevers, no CP, no SOB, no wheezing, no cough, no dizziness, no HAs, no rashes, no melena/hematochezia.  No polyuria or polydipsia. No focal weakness, paresthesias, or tremors.  No acute vision or hearing abnormalities.  No dysuria or unusual/new urinary urgency or frequency.  No recent changes in lower legs. No n/v/d or abd pain.  No palpitations.     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 ? to get liver bx if she gets bariatric surgery? Mild portal hypertensive gastropathy on EGD 08/2019.   Dysrhythmia    Fibromyalgia    GAD (generalized anxiety disorder)    GERD    History of hiatal hernia    History of iron deficiency anemia 12/2018   Possibly inadequate absorption secondary to chronic/long term PPI therapy + portal hypertensive gastroduodenopathy. No GIB found on EGD, colonosc,  and givens. Iron infusion approx 02/2021. [place] MVI.   History of thrombocytopenia 12/2011   Hyperlipidemia    Intolerant of statins   HYPERTENSION    IBS (irritable bowel syndrome)    -D.  Good response to bentyl and imodium as of 06/2018 GI f/u.   IDDM (insulin dependent diabetes mellitus)    with DPN (managed by Dr. Cruzita Lederer but then in 2018 pt preferred to have me manage for her convenience)   Limited  mobility    Requires a walker for arthritic pain, widespread musculoskeletal pain, and neuropathic pain.   Morbid obesity ([place])    As of 11/2018, pt considering sleeve gastectomy vs bipass as of eval by Dr. De Burrs considering as of 12/2019.   Nonalcoholic steatohepatitis    Viral Hep screens NEG.  CT 2015.  Transaminasemia.  U/S 07/2018 showed early changes of cirrhosis.   OSA (obstructive sleep apnea) 09/14/2015   sleep study 09/07/15: severe obstructive sleep apnea with an AHI of 72 and SaO2 low of 75%.>refer to sleep med for eval and tx options   Osteoarthritis    hips, shoulders, knees   PAF (paroxysmal atrial fibrillation) ([place])    One documented episode (after getting EGD 2016).  Was on amiodarone x 3 mo.  Rate control with metoprolol + anticoag with xarelto.    PONV (postoperative nausea and vomiting)    Recurrent epistaxis    Granuloma in L nare cauterized by ENT 04/2020. Another cautery 06/2020   Small fiber neuropathy    Due to DM.  Symmetric hands and feet tingling/numbness.   Ulcerative colitis ([place])    Remicade infusion Q 8 weeks: in clinical and endoscopic remission as of 12/2018 GI f/u.  06/11/19 rpt colonoscopy->cecal and ascending colon colitis.    Past Surgical History:  Procedure Laterality Date   ABDOMINAL HYSTERECTOMY  1980   Paps no longer indicated.   BACK SURGERY     BACTERIAL OVERGROWTH TEST N/A 07/13/2015   Procedure: BACTERIAL OVERGROWTH TEST;  Surgeon: Rogene Houston, MD;  [facility];  Service: Endoscopy;  Laterality: N/A;  730     BILATERAL SALPINGOOPHORECTOMY  02/10/2001   BIOPSY  06/11/2019   Procedure: BIOPSY;  Surgeon: Rogene Houston, MD;  [facility];  Service: Endoscopy;;  colon   BIOPSY  09/27/2019   Procedure: BIOPSY;  Surgeon: Rogene Houston, MD;  [facility];  Service: Endoscopy;;  gastric duodenal   BIOPSY  09/15/2020   Procedure: BIOPSY;  Surgeon: Harvel Quale, MD;  [facility];  Service: Gastroenterology;;   BREAST REDUCTION SURGERY  1994   bilat   BREAST SURGERY     CARDIOVASCULAR STRESS TEST  07/2010   Lexiscan myoview: normal   CARPAL TUNNEL RELEASE Right 1996   CARPAL TUNNEL RELEASE Left 03/21/2003   CARPAL TUNNEL RELEASE Right 05/04/2013   Procedure: CARPAL TUNNEL RELEASE;  Surgeon: Cammie Sickle., MD;  Location: [place];  Service: Orthopedics;  Laterality: Right;   CARPAL TUNNEL RELEASE Left 09/21/2013   Procedure: LEFT CARPAL TUNNEL RELEASE;  Surgeon: Cammie Sickle., MD;  Location: [place];  Service: Orthopedics;  Laterality: Left;   CHOLECYSTECTOMY     COLONOSCOPY WITH PROPOFOL N/A 08/04/2015   Colitis in remission.  No polyps.  Procedure: COLONOSCOPY WITH PROPOFOL;  Surgeon: Rogene Houston, MD;  [facility];  Service: Endoscopy;  Laterality: N/A;  cecum time in  0820   time  out  0827    total time 7 minutes   COLONOSCOPY WITH PROPOFOL N/A 06/11/2019   cecal and ascending colon colitis.  Procedure: COLONOSCOPY WITH PROPOFOL;  Surgeon: Rogene Houston, MD;  [facility];  Service: Endoscopy;  Laterality: N/A;  730a   COLONOSCOPY WITH PROPOFOL N/A 09/15/2020   Procedure: COLONOSCOPY WITH PROPOFOL;  Surgeon: Harvel Quale, MD;  [facility];  Service: Gastroenterology;  Laterality: N/A;  1030   ESOPHAGEAL DILATION N/A 08/04/2015   Procedure: ESOPHAGEAL DILATION;  Surgeon: Rogene Houston, MD;  [facility];  Service: Endoscopy;  Laterality: N/AKelvin Cellar, no mucousal disruption   ESOPHAGOGASTRODUODENOSCOPY  09/27/2019   Performed for IDA.  Esoph dilation was done but no stricture present.  Mild portal hypertensive gastropathy, o/w normal.  Duodenal bx NEG.  h pylori neg.   ESOPHAGOGASTRODUODENOSCOPY (EGD) WITH ESOPHAGEAL DILATION  12/02/2005   ESOPHAGOGASTRODUODENOSCOPY (EGD) WITH PROPOFOL N/A 08/04/2015   Procedure: ESOPHAGOGASTRODUODENOSCOPY (EGD) WITH  PROPOFOL;  Surgeon: Rogene Houston, MD;  [facility];  Service: Endoscopy;  Laterality: N/A;  procedure 1   ESOPHAGOGASTRODUODENOSCOPY (EGD) WITH PROPOFOL N/A 09/27/2019   Procedure: ESOPHAGOGASTRODUODENOSCOPY (EGD) WITH PROPOFOL;  Surgeon: Rogene Houston, MD;  [facility];  Service: Endoscopy;  Laterality: N/A;  12:10   ESOPHAGOGASTRODUODENOSCOPY (EGD) WITH PROPOFOL N/A 10/31/2021   Procedure: ESOPHAGOGASTRODUODENOSCOPY (EGD) WITH PROPOFOL;  Surgeon: Rogene Houston, MD;  [facility];  Service: Endoscopy;  Laterality: N/A;  [place]  01/17/2012   Procedure: FLEXIBLE SIGMOIDOSCOPY;  Surgeon: Rogene Houston, MD;  [facility];  Service: Endoscopy;  Laterality: N/A;   GIVENS CAPSULE STUDY N/A 08/03/2019   Procedure: GIVENS CAPSULE STUDY (performed for IDA)->some food debris in stomach and small amount of blood.  Surgeon: Rogene Houston, MD;  [facility];  Service: Endoscopy;  Laterality: N/A;  730AM   HEMILAMINOTOMY LUMBAR SPINE Bilateral 09/07/1999   L4-5   KNEE ARTHROSCOPY Right 01/1999; 10/2000   LYSIS OF ADHESION  02/10/2001   MALONEY DILATION  09/27/2019   Procedure: MALONEY DILATION;  Surgeon: Rogene Houston, MD;  [facility];  Service: Endoscopy;;   NASAL ENDOSCOPY WITH EPISTAXIS CONTROL Bilateral 01/25/2022   Procedure: NASAL ENDOSCOPY WITH EPISTAXIS CONTROL;  Surgeon: Jerrell Belfast, MD;  Location: [place];  Service: ENT;  Laterality: Bilateral;   NASAL SEPTOPLASTY W/ TURBINOPLASTY Bilateral 01/25/2022   Procedure: NASAL SEPTOPLASTY WITH TURBINATE REDUCTION;  Surgeon: Jerrell Belfast, MD;  Location: [place];  Service: ENT;  Laterality: Bilateral;   [place]; 09/12/2006   TARSAL TUNNEL RELEASE  2002   TRANSTHORACIC ECHOCARDIOGRAM  08/04/2015   EF 60-65%, normal wall motion, mild LVH, mild LA dilation, grd I DD.   TUMOR EXCISION Left 03/21/2003   dorsal 1st web space (hand)    URETEROLYSIS Right 02/10/2001    Outpatient Medications Prior to Visit  Medication Sig Dispense Refill   ACCU-CHEK GUIDE test strip USE TO CHECK BLOOD GLUCOSE UP TO 6 TIMES DAILY AS DIRECTED 200 strip 1   Accu-Chek Softclix Lancets lancets USE TO CHECK BLOOD GLUCOSE UP TO 6 TIMES DAILY AS DIRECTED 200 each 1   acetaminophen (TYLENOL) 325 MG tablet Take 650 mg by mouth every 6 (six) hours as needed for moderate pain or headache.     amitriptyline (ELAVIL) 25 MG tablet Take 25 mg by mouth in the morning and at bedtime.     B-D  UF III MINI PEN NEEDLES 31G X 5 MM MISC USE TO INJECT INSULINS EQUAL TO 6 TIMES DAILY. 600 each 5   blood glucose meter kit and supplies KIT Use up to six times daily as directed. DX. E11.9 1 each 0   canagliflozin (INVOKANA) 100 MG TABS tablet Take 1 tablet (100 mg total) by mouth daily before breakfast. 90 tablet 3   citalopram (CELEXA) 20 MG tablet TAKE 1 TABLET BY MOUTH EVERY DAY (Patient taking differently: Take 20 mg by mouth at bedtime.) 90 tablet 3   dicyclomine (BENTYL) 10 MG capsule TAKE 1 CAPSULE (10 MG TOTAL) BY MOUTH EVERY 8 (EIGHT) HOURS AS NEEDED FOR SPASMS (ABDOMINAL PAIN). 270 capsule 1   Dulaglutide (TRULICITY) 1.5 ZY/6.0YT SOPN INJECT 1.5MG ONCE WEEKLY 18 mL 3   esomeprazole (NEXIUM) 20 MG capsule TAKE 1 CAPSULE BY MOUTH DAILY BEFORE BREAKFAST 90 capsule 3   ferric carboxymaltose (INJECTAFER) 750 MG/15ML SOLN injection Inject 15 mLs (750 mg total) into the vein as directed for 1 dose. Repeat in 7 days (Patient not taking: Reported on 01/17/2022) 15 mL 1   furosemide (LASIX) 40 MG tablet Take 1 tablet (40 mg total) by mouth daily. 90 tablet 1   gabapentin (NEURONTIN) 600 MG tablet TAKE 1 AND 1/2 TABLETS BY MOUTH TWICE A DAY 270 tablet 3   inFLIXimab (REMICADE) 100 MG injection Inject 100 mg into the vein every 8 (eight) weeks.      insulin regular human CONCENTRATED (HUMULIN R U-500 KWIKPEN) 500 UNIT/ML KwikPen Inject 125-135 Units into the skin See admin  instructions. Inject 110 units with breakfast, 110 units with dinner, and 50 U q evening prn hyperglycemia (Patient taking differently: Inject 110 Units into the skin 3 (three) times daily with meals.) 45 mL 3   lactase (LACTAID) 3000 UNITS tablet Take 3,000 Units by mouth as needed (when eating foods containing dairy).      loperamide (IMODIUM) 2 MG capsule Take 1 capsule (2 mg total) by mouth 2 (two) times daily as needed for diarrhea or loose stools. 90 capsule 5   meclizine (ANTIVERT) 25 MG tablet Take 1 tablet (25 mg total) by mouth 3 (three) times daily as needed for dizziness or nausea. 90 tablet 0   metoprolol succinate (TOPROL-XL) 100 MG 24 hr tablet TAKE 1 TABLET BY MOUTH 2 TIMES DAILY. TAKE WITH OR IMMEDIATELY FOLLOWING A MEAL 180 tablet 3   Pediatric Multivitamins-Iron (FLINTSTONES COMPLETE) 18 MG CHEW Chew 1 tablet by mouth 2 (two) times daily.     potassium chloride (KLOR-CON M10) 10 MEQ tablet Take 1 tablet (10 mEq total) by mouth daily. 180 tablet 0   No facility-administered medications prior to visit.    Allergies  Allergen Reactions   Omeprazole Anaphylaxis and Swelling    SWELLING OF TONGUE AND THROAT   Actos [Pioglitazone] Swelling and Other (See Comments)    Weight gain, tongue swelling    Benzocaine-Menthol Swelling    SWELLING OF MOUTH   Colesevelam Other (See Comments)    GI UPSET   Flagyl [Metronidazole Hcl] Other (See Comments)    DIAPHORESIS   Metformin And Related Diarrhea   Shrimp [Shellfish Allergy] Itching    OF THROAT AND EARS, if consumed raw   Statins Palpitations   Allevyn Adhesive [Wound Dressings]     Other reaction(s): Unknown   Desipramine Hcl Itching, Nausea Only and Other (See Comments)    "swimmy" headed, ears itched    Hydromorphone Itching   Jardiance [Empagliflozin] Other (See  Comments)    weakness   Lactose Intolerance (Gi) Diarrhea    Gas, bloating   Adhesive [Tape] Other (See Comments)    SKIN IRRITATION AND BRUISING    Nisoldipine Itching   Percocet [Oxycodone-Acetaminophen] Itching   ROS As per HPI  PE:    02/20/2022    1:10 PM 01/25/2022    9:30 AM 01/25/2022    9:15 AM  Vitals with BMI  Height _0     Weight 226 lbs 3 oz    BMI 52.77    Systolic 824 235 361  Diastolic 67 64 71  Pulse 80 68 72     Physical Exam  Gen: Alert, well appearing.  Patient is oriented to person, place, time, and situation. AFFECT: pleasant, lucid thought and speech. CV: RRR, no m/r/g.   LUNGS: CTA bilat, nonlabored resps, good aeration in all lung fields.   LABS:  Last CBC Lab Results  Component Value Date   WBC 4.5 01/22/2022   HGB 12.0 01/22/2022   HCT 39.4 01/22/2022   MCV 96.3 01/22/2022   MCH 29.3 01/22/2022   RDW Not Measured 01/22/2022   PLT 112 (L) 01/22/2022   Lab Results  Component Value Date   IRON 83 11/22/2021   TIBC 363 11/22/2021   FERRITIN 6 (L) 44/31/5400   Last metabolic panel Lab Results  Component Value Date   GLUCOSE 184 (H) 01/25/2022   NA 136 01/25/2022   K 3.6 01/25/2022   CL 103 01/25/2022   CO2 27 01/25/2022   BUN 8 01/25/2022   CREATININE 0.79 01/25/2022   GFRNONAA >60 01/25/2022   CALCIUM 8.4 (L) 01/25/2022   PROT 6.6 01/25/2022   ALBUMIN 3.1 (L) 01/25/2022   LABGLOB 3.4 08/17/2015   AGRATIO 1.0 08/17/2015   BILITOT 1.0 01/25/2022   ALKPHOS 60 01/25/2022   AST 53 (H) 01/25/2022   ALT 35 01/25/2022   ANIONGAP 6 01/25/2022   Last lipids Lab Results  Component Value Date   CHOL 165 08/21/2021   HDL 34.20 (L) 08/21/2021   LDLCALC 106 (H) 08/21/2021   LDLDIRECT 69.5 01/24/2011   TRIG 122.0 08/21/2021   CHOLHDL 5 08/21/2021   Last hemoglobin A1c Lab Results  Component Value Date   HGBA1C 5.5 01/22/2022   Last thyroid functions Lab Results  Component Value Date   TSH 1.09 08/18/2020   IMPRESSION AND PLAN:  #1 type 2 diabetes, fluctuating glucose levels but improved compared to her usual. Hemoglobin A1c today in the office was 5.5%! Continue  U-500 insulin 110 units before every meal.  #2 hypertension, well controlled on Toprol-XL 100 mg a day. Electrolytes and creatinine normal on 01/25/2022.  #3 iron deficiency anemia. she has some decreased absorption due to portal hypertensive gastropathy and she has history of recurrent nosebleeds. She does not tolerate oral iron.  She does not really comply well with her MVI with iron. She is status post iron infusions since I saw her last. Nosebleeds have resolved since getting recent nasal septum/turbinate surgery. Her hemoglobin was 12 about 5 weeks ago. Lan recheck CBC and iron panel at next follow-up in 3 months.  #4 NASH cirrhosis---followed by Dr. Laural [name]. Portal hypertensive gastropathy on recent EGD October 31, 2021.  No varices. Liver ultrasound with Doppler 11/23/2021 with concern of mass.  Follow-up MR liver January 03, 2022 showed hepatic cirrhosis but no evidence of neoplasm or other abnormality. No sign of liver dysfunction other than mild hypoalbuminemia.  An After Visit Summary was printed and given  to the patient.  FOLLOW UP: 3 months  Signed:  Crissie Sickles, MD           02/20/2022

## 2022-02-20 NOTE — Patient Instructions (Signed)
Dr. Wynelle Link [phone]

## 2022-03-08 ENCOUNTER — Ambulatory Visit (INDEPENDENT_AMBULATORY_CARE_PROVIDER_SITE_OTHER): Payer: Medicare PPO | Admitting: Pulmonary Disease

## 2022-03-08 ENCOUNTER — Encounter: Payer: Self-pay | Admitting: Pulmonary Disease

## 2022-03-08 VITALS — BP 128/78 | HR 68 | Temp 97.6°F | Ht 60.0 in | Wt 223.0 lb

## 2022-03-08 DIAGNOSIS — G4733 Obstructive sleep apnea (adult) (pediatric): Secondary | ICD-10-CM | POA: Diagnosis not present

## 2022-03-08 NOTE — Progress Notes (Signed)
[facility]  Chief Complaint  Patient presents with   Follow-up    Surgery for deviated septum and has not used for 8 weeks.     Past Surgical History:  She  has a past surgical history that includes Abdominal hysterectomy (1980); Breast reduction surgery (1994); Rectocele repair (1990; 09/12/2006); Carpal tunnel release (Right, 1996); Knee arthroscopy (Right, 01/1999; 10/2000); Hemilaminotomy lumbar spine (Bilateral, 09/07/1999); Cholecystectomy; Flexible sigmoidoscopy (01/17/2012); Bilateral salpingoophorectomy (02/10/2001); Tarsal tunnel release (2002); Ureterolysis (Right, 02/10/2001); Lysis of adhesion (02/10/2001); Carpal tunnel release (Left, 03/21/2003); Tumor excision (Left, 03/21/2003); Esophagogastroduodenoscopy (egd) with esophageal dilation (12/02/2005); Carpal tunnel release (Right, 05/04/2013); Carpal tunnel release (Left, 09/21/2013); Bacterial overgrowth test (N/A, 07/13/2015); Esophagogastroduodenoscopy (egd) with propofol (N/A, 08/04/2015); Esophageal dilation (N/A, 08/04/2015); Colonoscopy with propofol (N/A, 08/04/2015); transthoracic echocardiogram (08/04/2015); Cardiovascular stress test (07/2010); Colonoscopy with propofol (N/A, 06/11/2019); biopsy (06/11/2019); Givens capsule study (N/A, 08/03/2019); Esophagogastroduodenoscopy (09/27/2019); Esophagogastroduodenoscopy (egd) with propofol (N/A, 09/27/2019); maloney dilation (09/27/2019); biopsy (09/27/2019); Colonoscopy with propofol (N/A, 09/15/2020); biopsy (09/15/2020); Esophagogastroduodenoscopy (egd) with propofol (N/A, 10/31/2021); Breast surgery; Back surgery; Nasal septoplasty w/ turbinoplasty (Bilateral, 01/25/2022); and Nasal endoscopy with epistaxis control (Bilateral, 01/25/2022).  Past Medical History:  NASH with cirrhosis, Fibromyalgia, Anxiety, GERD, Anemia, HTN, HLD, IBS, DM type 2, PAF, Epistaxis, Ulcerative colitis  Constitutional:  BP 128/78 (BP Location: Left Wrist)    Pulse 68   Temp 97.6 F (36.4 C) (Temporal)   Ht 5' (1.524 m)   Wt 223 lb (101.2 kg)   SpO2 98% Comment: ra  BMI 43.55 kg/m   Brief Summary:  [name] is a 76 y.o. female with obstructive sleep apnea.      Subjective:   She had nasal septoplasty, bilateral inferior turbinate reduction and nasal cautery with Dr. Wilburn Cornelia on 01/25/22.    She hasn't been able to use her CPAP since she had surgery.  She feels her sleep has been fine, and she is not back to sleeping in her bed.  She did get a new CPAP machine prior to her surgery, and it was working well.  Physical Exam:   Appearance - well kempt   ENMT - no sinus tenderness, no oral exudate, no LAN, Mallampati 3 airway, no stridor  Respiratory - equal breath sounds bilaterally, no wheezing or rales  CV - s1s2 regular rate and rhythm, no murmurs  Ext - no clubbing, no edema  Skin - no rashes  Psych - normal mood and affect    Sleep Tests:  PSG 09/07/15 >> AHI 72, SpO2 low 75% Auto CPAP 11/29/20 to 12/28/20 >> used on 27 of 30 nights with average 5 hrs 57 min.  Average AHI 3.7 with median CPAP 8 and 95 th percentile CPAP 12 cm H2O  Cardiac Tests:  Echo 08/04/15 >> mod LVH, EF 60 to 68%, grade 1 diastolic CHF  Social History:  She  reports that she has never smoked. She has never used smokeless tobacco. She reports that she does not currently use alcohol. She reports that she does not use drugs.  Family History:  Her family history includes Alcohol abuse in an other family member; Asthma in her maternal grandfather; Colon cancer in her paternal grandfather; Diabetes in her mother and son; Emphysema in her maternal grandfather; Heart attack in her father; Heart disease in her father; Hypertension in her mother and son; Lung disease in her father; Stomach cancer in her paternal grandmother.     Assessment/Plan:   Obstructive sleep apnea. -  she is compliant with CPAP and reports benefit from therapy - uses [employer]  for her DME - her current machine was ordered on 10/30/21 - will arrange for home sleep study to assess current status of her sleep apnea; she would like to see if recent nasal surgery has improved her sleep apnea enough to where she no longer needs CPAP therapy - she is to continue auto CPAP 5 to 15 cm H2O otherwise  Ulcerative colitis, NASH with cirrhosis. - followed by Dr. [name] with Gastroenterology  Time Spent Involved in Patient Care on Day of Examination:  26 minutes  Follow up:   Patient Instructions  Will arrange for a home sleep study and call you with results  Follow up in 1 year  Medication List:   Allergies as of 03/08/2022       Reactions   Omeprazole Anaphylaxis, Swelling   SWELLING OF TONGUE AND THROAT   Actos [pioglitazone] Swelling, Other (See Comments)   Weight gain, tongue swelling   Benzocaine-menthol Swelling   SWELLING OF MOUTH   Colesevelam Other (See Comments)   GI UPSET   Flagyl [metronidazole Hcl] Other (See Comments)   DIAPHORESIS   Metformin And Related Diarrhea   Shrimp [shellfish Allergy] Itching   OF THROAT AND EARS, if consumed raw   Statins Palpitations   Allevyn Adhesive [wound Dressings]    Other reaction(s): Unknown   Desipramine Hcl Itching, Nausea Only, Other (See Comments)   "swimmy" headed, ears itched   Hydromorphone Itching   Jardiance [empagliflozin] Other (See Comments)   weakness   Lactose Intolerance (gi) Diarrhea   Gas, bloating   Adhesive [tape] Other (See Comments)   SKIN IRRITATION AND BRUISING   Nisoldipine Itching   Percocet [oxycodone-acetaminophen] Itching        Medication List        Accurate as of March 08, 2022  1:01 PM. If you have any questions, ask your nurse or doctor.          Accu-Chek Guide test strip Generic drug: glucose blood USE TO CHECK BLOOD GLUCOSE UP TO 6 TIMES DAILY AS DIRECTED   Accu-Chek Softclix Lancets lancets Use as instructed   acetaminophen 325 MG  tablet Commonly known as: TYLENOL Take 650 mg by mouth every 6 (six) hours as needed for moderate pain or headache.   amitriptyline 25 MG tablet Commonly known as: ELAVIL Take 25 mg by mouth in the morning and at bedtime.   B-D UF III MINI PEN NEEDLES 31G X 5 MM Misc Generic drug: Insulin Pen Needle USE TO INJECT INSULINS EQUAL TO 6 TIMES DAILY.   blood glucose meter kit and supplies Kit Use up to six times daily as directed. DX. E11.9   canagliflozin 100 MG Tabs tablet Commonly known as: Invokana Take 1 tablet (100 mg total) by mouth daily before breakfast.   citalopram 20 MG tablet Commonly known as: CELEXA TAKE 1 TABLET BY MOUTH EVERY DAY What changed: when to take this   dicyclomine 10 MG capsule Commonly known as: BENTYL TAKE 1 CAPSULE (10 MG TOTAL) BY MOUTH EVERY 8 (EIGHT) HOURS AS NEEDED FOR SPASMS (ABDOMINAL PAIN).   esomeprazole 20 MG capsule Commonly known as: NEXIUM TAKE 1 CAPSULE BY MOUTH DAILY BEFORE BREAKFAST   Flintstones Complete 18 MG Chew Chew 1 tablet by mouth 2 (two) times daily.   furosemide 40 MG tablet Commonly known as: LASIX Take 1 tablet (40 mg total) by mouth daily.   gabapentin 600 MG tablet  Commonly known as: NEURONTIN TAKE 1 AND 1/2 TABLETS BY MOUTH TWICE A DAY   HumuLIN R U-500 KwikPen 500 UNIT/ML KwikPen Generic drug: insulin regular human CONCENTRATED Inject 125-135 Units into the skin See admin instructions. Inject 110 units with breakfast, 110 units with dinner, and 50 U q evening prn hyperglycemia What changed:  how much to take when to take this additional instructions   inFLIXimab 100 MG injection Commonly known as: REMICADE Inject 100 mg into the vein every 8 (eight) weeks.   Injectafer 750 MG/15ML Soln injection Generic drug: ferric carboxymaltose Inject 15 mLs (750 mg total) into the vein as directed for 1 dose. Repeat in 7 days   lactase 3000 units tablet Commonly known as: LACTAID Take 3,000 Units by mouth as  needed (when eating foods containing dairy).   loperamide 2 MG capsule Commonly known as: IMODIUM Take 1 capsule (2 mg total) by mouth 2 (two) times daily as needed for diarrhea or loose stools.   meclizine 25 MG tablet Commonly known as: ANTIVERT Take 1 tablet (25 mg total) by mouth 3 (three) times daily as needed for dizziness or nausea.   metoprolol succinate 100 MG 24 hr tablet Commonly known as: TOPROL-XL TAKE 1 TABLET BY MOUTH 2 TIMES DAILY. TAKE WITH OR IMMEDIATELY FOLLOWING A MEAL   potassium chloride 10 MEQ tablet Commonly known as: Klor-Con M10 Take 1 tablet (10 mEq total) by mouth daily.   Trulicity 1.5 DP/3.2QV Sopn Generic drug: Dulaglutide INJECT 1.5MG ONCE WEEKLY        Signature:  Chesley Mires, MD [place] Pager - [phone] 03/08/2022, 1:01 PM

## 2022-03-08 NOTE — Patient Instructions (Signed)
Will arrange for a home sleep study and call you with results  Follow up in 1 year

## 2022-03-13 DIAGNOSIS — K5180 Other ulcerative colitis without complications: Secondary | ICD-10-CM | POA: Diagnosis not present

## 2022-03-18 ENCOUNTER — Telehealth (INDEPENDENT_AMBULATORY_CARE_PROVIDER_SITE_OTHER): Payer: Self-pay | Admitting: Gastroenterology

## 2022-03-18 NOTE — Telephone Encounter (Signed)
I received the results of the most recent blood work-up performed on 03/13/2022 which showed platelet count of 130, white blood cell count of 5.2, hemoglobin 12.2, CMP with increasing aminotransferases AST was 61, ALT 36, total bilirubin 0.4, alkaline phosphatase 112, albumin 3.5, normal electrolytes and renal function with creatinine of 0.26.  Unfortunately, no INR was checked.

## 2022-03-23 ENCOUNTER — Other Ambulatory Visit: Payer: Self-pay | Admitting: Cardiology

## 2022-04-11 ENCOUNTER — Encounter: Payer: Self-pay | Admitting: Family Medicine

## 2022-04-24 ENCOUNTER — Telehealth: Payer: Self-pay

## 2022-04-24 NOTE — Patient Outreach (Signed)
  Care Management   Outreach Note  04/24/2022 Name: AMICA HARRON MRN: [id number] DOB: 1946-07-15  An unsuccessful telephone outreach was attempted today. The patient was referred to the case management team for assistance with care management and care coordination.   Follow Up Plan:  The care management team will reach out to the patient again over the next 7 days.   Daneen Schick, BSW, CDP Social Worker, Certified Dementia Practitioner Care Coordination [phone]

## 2022-04-26 ENCOUNTER — Ambulatory Visit: Payer: Self-pay

## 2022-04-26 NOTE — Patient Outreach (Signed)
  Care Management   Outreach Note  04/26/2022 Name: RAFAELITA FOISTER MRN: [id number] DOB: 05-23-1946  A second unsuccessful telephone outreach was attempted today. The patient was referred to the case management team for assistance with care management and care coordination.   Follow Up Plan:  The care management team will reach out to the patient again over the next 10 days.   Daneen Schick, BSW, CDP Social Worker, Certified Dementia Practitioner Care Coordination [phone]

## 2022-04-29 ENCOUNTER — Ambulatory Visit: Payer: Self-pay

## 2022-04-29 NOTE — Patient Outreach (Signed)
  Care Management   Outreach Note  04/29/2022 Name: [name] MRN: [id number] DOB: 05-27-1946  Third unsuccessful telephone outreach was attempted today. The patient was referred to the case management team for assistance with care management and care coordination. The patient's primary care provider has been notified of our unsuccessful attempts to make or maintain contact with the patient. The care management team is pleased to engage with this patient at any time in the future should he/she be interested in assistance from the care management team.  Follow Up Plan:  No further follow up required: The care coordination team is available to assist with patient care as needed.  Daneen Schick, BSW, CDP Social Worker, Certified Dementia Practitioner Care Coordination [phone]

## 2022-05-16 ENCOUNTER — Encounter (INDEPENDENT_AMBULATORY_CARE_PROVIDER_SITE_OTHER): Payer: Self-pay | Admitting: *Deleted

## 2022-05-16 ENCOUNTER — Other Ambulatory Visit (INDEPENDENT_AMBULATORY_CARE_PROVIDER_SITE_OTHER): Payer: Self-pay | Admitting: Gastroenterology

## 2022-05-16 DIAGNOSIS — K5180 Other ulcerative colitis without complications: Secondary | ICD-10-CM | POA: Diagnosis not present

## 2022-05-16 NOTE — Progress Notes (Signed)
error 

## 2022-05-20 IMAGING — MR MR ABDOMEN WO/W CM
20 series · 48 of 48 positions shown · IV contrast (Gadavist)
Comparison: Ultrasound on 11/23/2021 and MRI on 08/21/2020

CLINICAL DATA: Cirrhosis. Enlarging left hepatic lobe lesion on
recent ultrasound.

EXAM:
MRI ABDOMEN WITHOUT AND WITH CONTRAST
TECHNIQUE: Multiplanar multisequence MR imaging of the abdomen was performed
both before and after the administration of intravenous contrast.
CONTRAST:  10mL GADAVIST GADOBUTROL 1 MMOL/ML IV SOLN

[Series 4: cor haste · coronal · 6.0mm · 1.25mm/px · 3 of 44 slices shown]
[im 1/44]
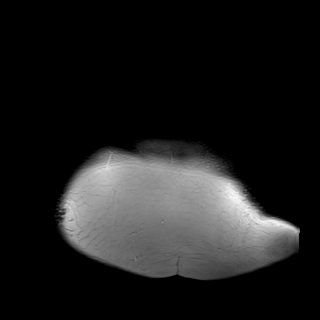
[im 22/44]
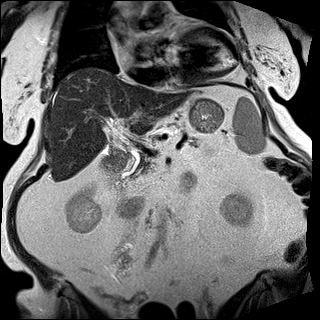
[im 44/44]
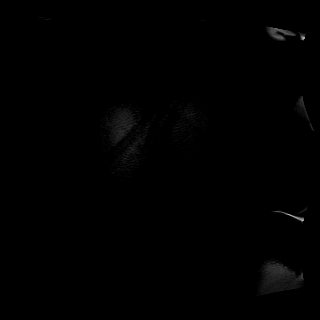

[Series 5: ax haste · axial · 6.0mm · 1.38mm/px · z∈[-396,-130]mm · 2 of 38 slices shown]
[im 1/38]
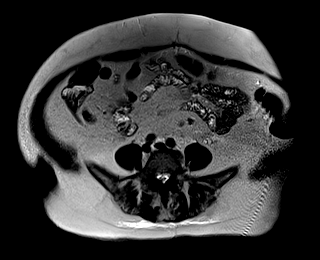
[im 38/38]
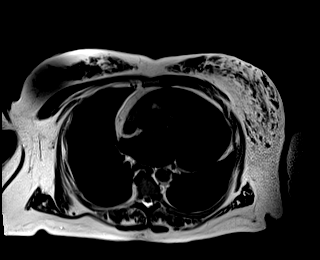

[Series 6: T2 fat-sat · axial · 6.0mm · 1.19mm/px · z∈[-330,-121]mm · 2 of 30 slices shown]
[im 1/30]
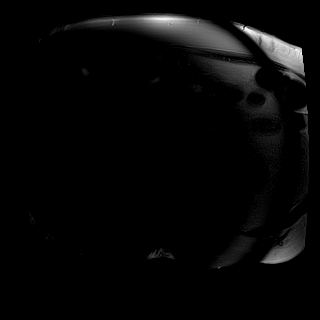
[im 30/30]
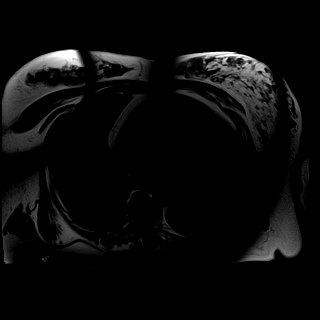

[Series 7: DWI · axial · 6.0mm · 1.64mm/px · 1 of 38 slices shown (1 of 4)]
[im 1/38]
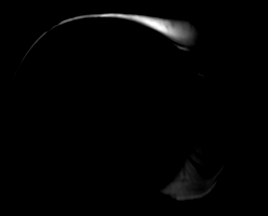

[Series 7: DWI · axial · 6.0mm · 1.64mm/px · 1 of 38 slices shown (2 of 4)]
[im 1/38]
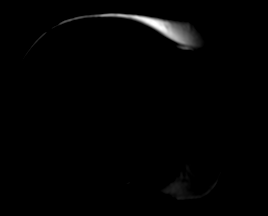

[Series 7: DWI · axial · 6.0mm · 1.64mm/px · 1 of 38 slices shown (3 of 4)]
[im 1/38]
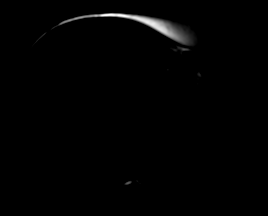

[Series 8: DWI · axial · 6.0mm · 1.64mm/px · 1 of 38 slices shown (4 of 4)]
[im 1/38]
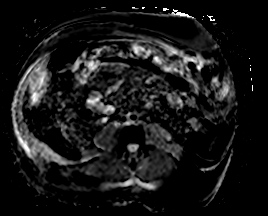

[Series 9: bSSFP · axial · 6.0mm · 0.86mm/px · 1 of 36 slices shown]
[im 1/36]
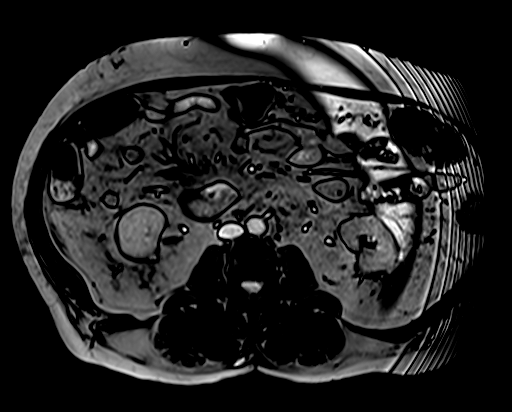

[Series 10: ax in and · axial · 3.0mm · 1.19mm/px · z∈[-363,-126]mm · 3 of 80 slices shown (1 of 2)]
[im 1/80]
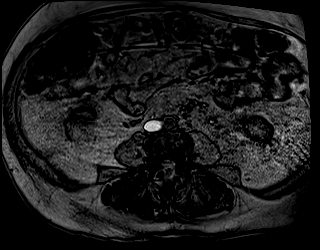
[im 40/80]
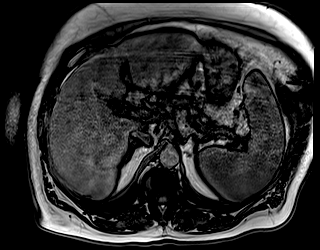
[im 80/80]
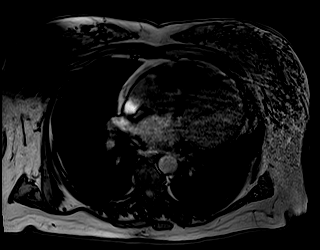

[Series 11: ax in and · axial · 3.0mm · 1.19mm/px · z∈[-363,-126]mm · 3 of 80 slices shown (2 of 2)]
[im 1/80]
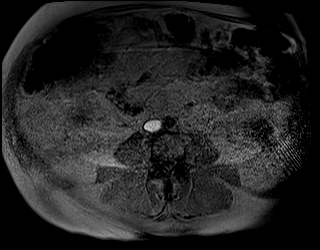
[im 40/80]
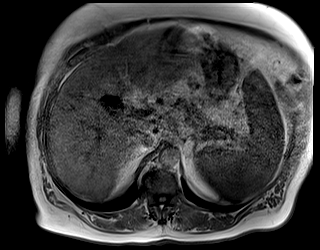
[im 80/80]
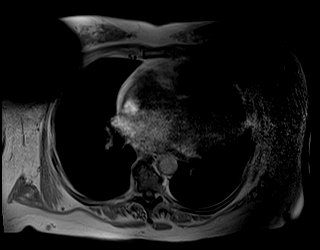

[Series 12: T1 dynamic · axial · non-contrast · 3.0mm · 1.19mm/px · z∈[-375,-114]mm · 3 of 88 slices shown (1 of 4)]
[im 1/88]
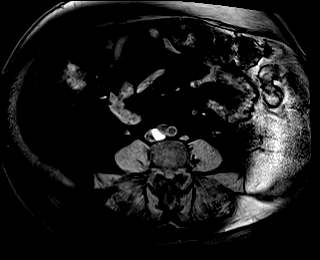
[im 44/88]
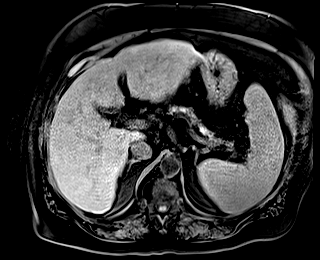
[im 88/88]
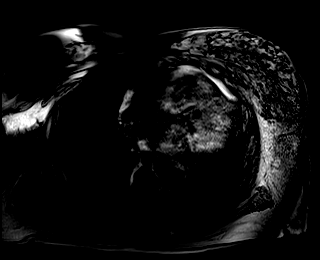

[Series 14: T1 dynamic post-contrast · axial · 3.0mm · 1.19mm/px · z∈[-375,-114]mm · 3 of 88 slices shown (1 of 6)]
[im 1/88]
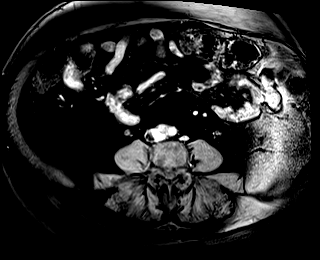
[im 44/88]
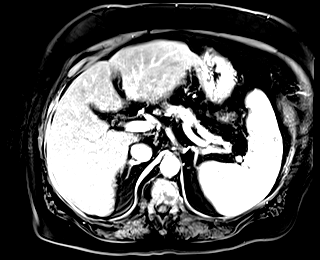
[im 88/88]
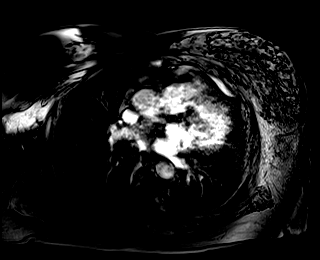

[Series 15: T1 dynamic · axial · 3.0mm · 1.19mm/px · z∈[-375,-114]mm · 3 of 88 slices shown (2 of 4)]
[im 1/88]
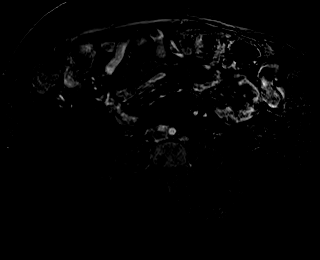
[im 44/88]
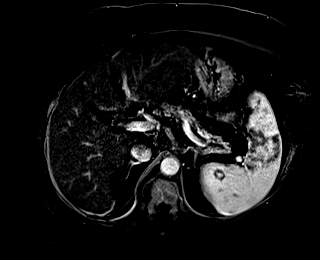
[im 88/88]
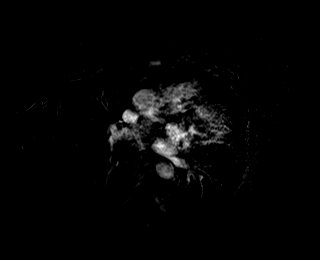

[Series 16: T1 dynamic post-contrast · axial · 3.0mm · 1.19mm/px · z∈[-375,-114]mm · 3 of 88 slices shown (2 of 6)]
[im 1/88]
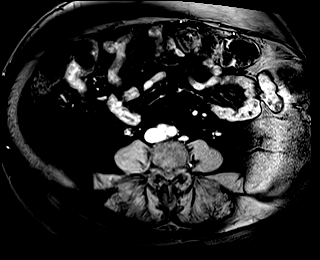
[im 44/88]
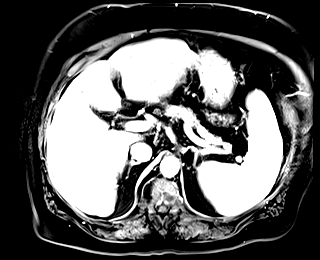
[im 88/88]
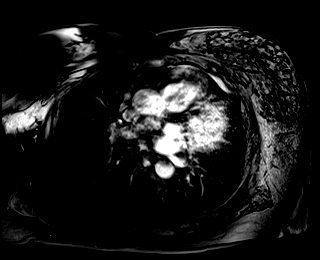

[Series 17: T1 dynamic · axial · 3.0mm · 1.19mm/px · z∈[-375,-114]mm · 3 of 88 slices shown (3 of 4)]
[im 1/88]
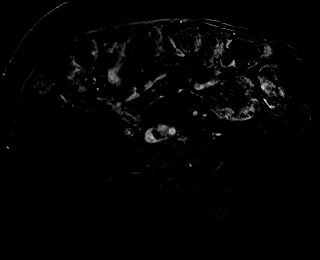
[im 44/88]
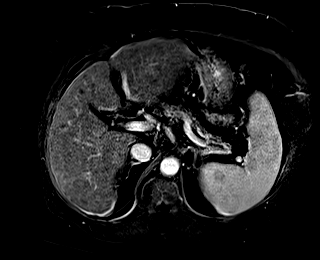
[im 88/88]
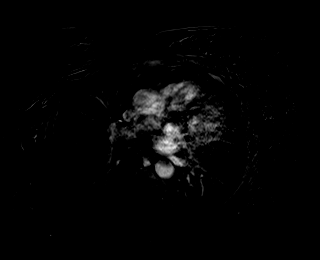

[Series 18: T1 dynamic post-contrast · axial · 3.0mm · 1.19mm/px · z∈[-375,-114]mm · 3 of 88 slices shown (3 of 6)]
[im 1/88]
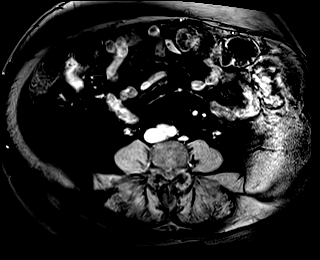
[im 44/88]
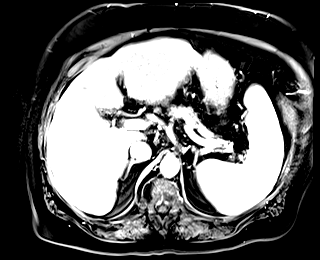
[im 88/88]
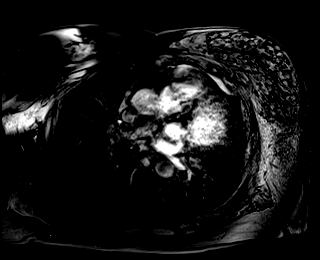

[Series 19: T1 dynamic · axial · 3.0mm · 1.19mm/px · z∈[-375,-114]mm · 3 of 88 slices shown (4 of 4)]
[im 1/88]
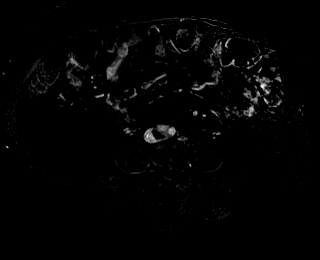
[im 44/88]
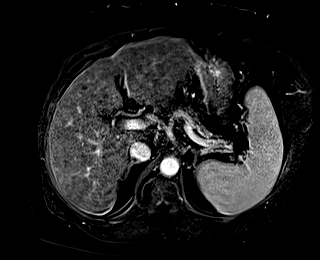
[im 88/88]
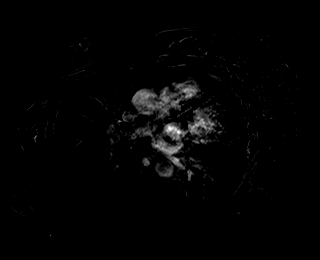

[Series 20: T1 dynamic post-contrast · coronal · 3.0mm · 1.19mm/px · 3 of 88 slices shown (4 of 6)]
[im 1/88]
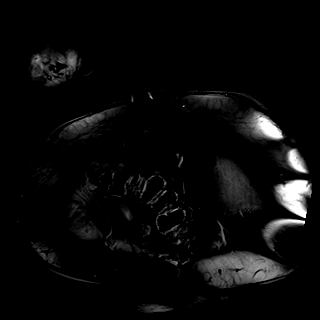
[im 44/88]
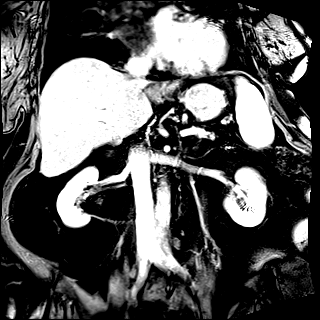
[im 88/88]
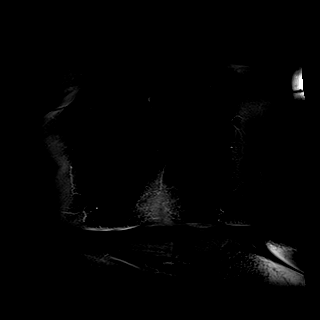

[Series 21: T1 dynamic post-contrast · axial · 3.0mm · 1.19mm/px · z∈[-375,-114]mm · 3 of 88 slices shown (5 of 6)]
[im 1/88]
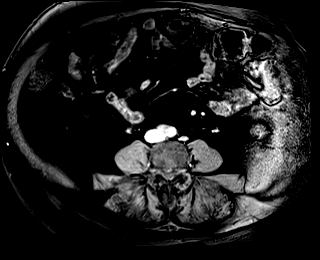
[im 44/88]
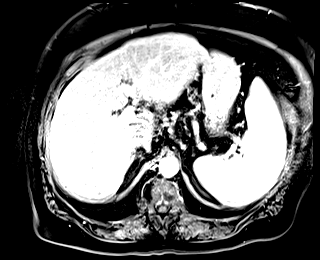
[im 88/88]
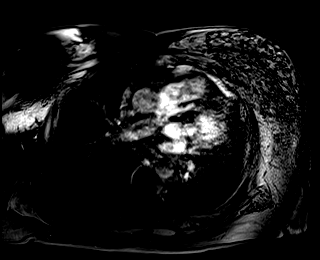

[Series 22: T1 dynamic post-contrast · axial · 3.0mm · 1.19mm/px · z∈[-375,-114]mm · 3 of 88 slices shown (6 of 6)]
[im 1/88]
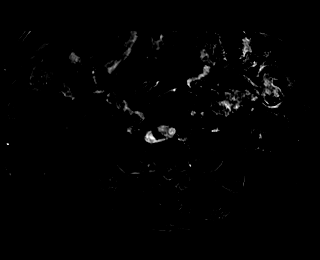
[im 44/88]
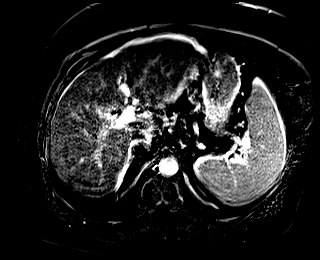
[im 88/88]
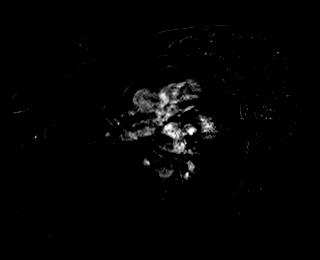

[48 of 48 positions shown; findings below may reference images not displayed]

FINDINGS: Lower chest: No acute findings.

Hepatobiliary: Hepatic cirrhosis is again demonstrated. No hepatic
masses are identified. Prior cholecystectomy. No evidence of biliary
obstruction.

Pancreas:  No mass or inflammatory changes.

Spleen:  Within normal limits in size and appearance.

Adrenals/Urinary Tract: No masses identified. No evidence of
hydronephrosis.

Stomach/Bowel: Unremarkable.

Vascular/Lymphatic: No pathologically enlarged lymph nodes
identified. No acute vascular findings.

Other:  None.

Musculoskeletal:  No suspicious bone lesions identified.
IMPRESSION: Hepatic cirrhosis. No evidence of hepatic neoplasm or other
significant abnormality.

## 2022-05-22 ENCOUNTER — Other Ambulatory Visit: Payer: Self-pay | Admitting: Family Medicine

## 2022-05-22 ENCOUNTER — Other Ambulatory Visit: Payer: Self-pay | Admitting: Cardiology

## 2022-05-23 ENCOUNTER — Encounter: Payer: Self-pay | Admitting: Family Medicine

## 2022-05-23 ENCOUNTER — Ambulatory Visit: Payer: Medicare PPO | Admitting: Family Medicine

## 2022-05-23 VITALS — BP 100/65 | HR 95 | Temp 98.0°F | Ht 60.0 in | Wt 219.4 lb

## 2022-05-23 DIAGNOSIS — Z794 Long term (current) use of insulin: Secondary | ICD-10-CM

## 2022-05-23 DIAGNOSIS — D508 Other iron deficiency anemias: Secondary | ICD-10-CM

## 2022-05-23 DIAGNOSIS — Z7901 Long term (current) use of anticoagulants: Secondary | ICD-10-CM | POA: Diagnosis not present

## 2022-05-23 DIAGNOSIS — E1140 Type 2 diabetes mellitus with diabetic neuropathy, unspecified: Secondary | ICD-10-CM

## 2022-05-23 DIAGNOSIS — K760 Fatty (change of) liver, not elsewhere classified: Secondary | ICD-10-CM | POA: Diagnosis not present

## 2022-05-23 DIAGNOSIS — I480 Paroxysmal atrial fibrillation: Secondary | ICD-10-CM | POA: Diagnosis not present

## 2022-05-23 DIAGNOSIS — I10 Essential (primary) hypertension: Secondary | ICD-10-CM

## 2022-05-23 LAB — COMPREHENSIVE METABOLIC PANEL
ALT: 21 U/L (ref 0–35)
AST: 27 U/L (ref 0–37)
Albumin: 3.5 g/dL (ref 3.5–5.2)
Alkaline Phosphatase: 87 U/L (ref 39–117)
BUN: 13 mg/dL (ref 6–23)
CO2: 29 mEq/L (ref 19–32)
Calcium: 8.8 mg/dL (ref 8.4–10.5)
Chloride: 100 mEq/L (ref 96–112)
Creatinine, Ser: 0.78 mg/dL (ref 0.40–1.20)
GFR: 74.65 mL/min (ref >60.00)
Glucose, Bld: 178 mg/dL — ABNORMAL HIGH (ref 70–99)
Potassium: 3.5 mEq/L (ref 3.5–5.1)
Sodium: 137 mEq/L (ref 135–145)
Total Bilirubin: 0.9 mg/dL (ref 0.2–1.2)
Total Protein: 7.5 g/dL (ref 6.0–8.3)

## 2022-05-23 LAB — CBC
HCT: 35.5 % — ABNORMAL LOW (ref 36.0–46.0)
Hemoglobin: 11.8 g/dL — ABNORMAL LOW (ref 12.0–15.0)
MCHC: 33.1 g/dL (ref 30.0–36.0)
MCV: 92.1 fl (ref 78.0–100.0)
Platelets: 150 10*3/uL (ref 150.0–400.0)
RBC: 3.85 Mil/uL — ABNORMAL LOW (ref 3.87–5.11)
RDW: 14.4 % (ref 11.5–15.5)
WBC: 5 10*3/uL (ref 4.0–10.5)

## 2022-05-23 LAB — POCT GLYCOSYLATED HEMOGLOBIN (HGB A1C)
HbA1c POC (<> result, manual entry): 6.7 % (ref 4.0–5.6)
HbA1c, POC (controlled diabetic range): 6.7 % (ref 0.0–7.0)
HbA1c, POC (prediabetic range): 6.7 % — AB (ref 5.7–6.4)
Hemoglobin A1C: 6.7 % — AB (ref 4.0–5.6)

## 2022-05-23 LAB — PROTIME-INR
INR: 2.5 ratio — ABNORMAL HIGH (ref 0.8–1.0)
Prothrombin Time: 25.7 s — ABNORMAL HIGH (ref 9.6–13.1)

## 2022-05-23 NOTE — Progress Notes (Signed)
OFFICE VISIT  05/23/2022  CC:  Chief Complaint  Patient presents with   Diabetes   Hypertension   Patient is a 76 y.o. female who presents for 75-monthfollow-up diabetes, IDA, NAFLD. A/P as of last visit: "#1 type 2 diabetes, fluctuating glucose levels but improved compared to her usual. Hemoglobin A1c today in the office was 5.5%! Continue U-500 insulin 110 units before every meal.   #2 hypertension, well controlled on Toprol-XL 100 mg a day. Electrolytes and creatinine normal on 01/25/2022.   #3 iron deficiency anemia. she has some decreased absorption due to portal hypertensive gastropathy and she has history of recurrent nosebleeds. She does not tolerate oral iron.  She does not really comply well with her MVI with iron. She is status post iron infusions since I saw her last. Nosebleeds have resolved since getting recent nasal septum/turbinate surgery. Her hemoglobin was 12 about 5 weeks ago. Lan recheck CBC and iron panel at next follow-up in 3 months.   #4 NASH cirrhosis---followed by Dr. RLaural [name] Portal hypertensive gastropathy on recent EGD October 31, 2021.  No varices. Liver ultrasound with Doppler 11/23/2021 with concern of mass.  Follow-up MR liver January 03, 2022 showed hepatic cirrhosis but no evidence of neoplasm or other abnormality. No sign of liver dysfunction other than mild hypoalbuminemia."  INTERIM HX: WDeclanis feeling well.  Home glucoses reviewed.  These are similar to the past, lots of fluctuations, no clear pattern. She is not taking any bedtime doses of insulin.  She does still have some nosebleed but they are much less frequent and only small amounts of blood--ever since having her nasal surgery 4 months ago.  She feels no palpitations, no dizziness, no chest pain, no shortness of breath. Her appetite is good.  No recent significant fluctuations in weight.  No abdominal pain.  Past Medical History:  Diagnosis Date   Carpal tunnel syndrome of right  wrist 03/2013   recurrent   Cirrhosis, nonalcoholic ([place] 116/3845  NASH--> early cirrhotic changes on ultrasound 07/2018. ? to get liver bx if she gets bariatric surgery? Mild portal hypertensive gastropathy on EGD 08/2019.   Dysrhythmia    Fibromyalgia    GAD (generalized anxiety disorder)    GERD    History of hiatal hernia    History of iron deficiency anemia 12/2018   Possibly inadequate absorption secondary to chronic/long term PPI therapy + portal hypertensive gastroduodenopathy. No GIB found on EGD, colonosc, and givens. Iron infusion approx 02/2021. [place] [facility].   History of thrombocytopenia 12/2011   Hyperlipidemia    Intolerant of statins   HYPERTENSION    IBS (irritable bowel syndrome)    -D.  Good response to bentyl and imodium as of 06/2018 GI f/u.   IDDM (insulin dependent diabetes mellitus)    with DPN (managed by Dr. GCruzita Ledererbut then in 2018 pt preferred to have me manage for her convenience)   Limited mobility    Requires a walker for arthritic pain, widespread musculoskeletal pain, and neuropathic pain.   Morbid obesity ([place]    As of 11/2018, pt considering sleeve gastectomy vs bipass as of eval by Dr. NDe Burrsconsidering as of 12/2019.   Nonalcoholic steatohepatitis    Viral Hep screens NEG.  CT 2015.  Transaminasemia.  U/S 07/2018 showed early changes of cirrhosis.   OSA (obstructive sleep apnea) 09/14/2015   sleep study 09/07/15: severe obstructive sleep apnea with an AHI of 72 and SaO2 low of 75%.>referred to sleep MD   Osteoarthritis  hips, shoulders, knees   PAF (paroxysmal atrial fibrillation) ([place])    One documented episode (after getting EGD 2016).  Was on amiodarone x 3 mo.  Rate control with metoprolol + anticoag with xarelto.    PONV (postoperative nausea and vomiting)    Recurrent epistaxis    Granuloma in L nare cauterized by ENT 04/2020. Another cautery 06/2020   Small fiber neuropathy    Due to DM.  Symmetric hands and feet  tingling/numbness.   Ulcerative colitis ([place])    Remicade infusion Q 8 weeks: in clinical and endoscopic remission as of 12/2018 GI f/u.  06/11/19 rpt colonoscopy->cecal and ascending colon colitis.    Past Surgical History:  Procedure Laterality Date   ABDOMINAL HYSTERECTOMY  1980   Paps no longer indicated.   BACK SURGERY     BACTERIAL OVERGROWTH TEST N/A 07/13/2015   Procedure: BACTERIAL OVERGROWTH TEST;  Surgeon: Rogene [place], MD;  [facility];  Service: Endoscopy;  Laterality: N/A;  730     BILATERAL SALPINGOOPHORECTOMY  02/10/2001   BIOPSY  06/11/2019   Procedure: BIOPSY;  Surgeon: Rogene [place], MD;  [facility];  Service: Endoscopy;;  colon   BIOPSY  09/27/2019   Procedure: BIOPSY;  Surgeon: Rogene [place], MD;  [facility];  Service: Endoscopy;;  gastric duodenal   BIOPSY  09/15/2020   Procedure: BIOPSY;  Surgeon: Harvel Quale, MD;  [facility];  Service: Gastroenterology;;   BREAST REDUCTION SURGERY  1994   bilat   BREAST SURGERY     CARDIOVASCULAR STRESS TEST  07/2010   Lexiscan myoview: normal   CARPAL TUNNEL RELEASE Right 1996   CARPAL TUNNEL RELEASE Left 03/21/2003   CARPAL TUNNEL RELEASE Right 05/04/2013   Procedure: CARPAL TUNNEL RELEASE;  Surgeon: Cammie Sickle., MD;  Location[place];  Service: Orthopedics;  Laterality: Right;   CARPAL TUNNEL RELEASE Left 09/21/2013   Procedure: LEFT CARPAL TUNNEL RELEASE;  Surgeon: Cammie Sickle., MD;  Location: [place];  Service: Orthopedics;  Laterality: Left;   CHOLECYSTECTOMY     COLONOSCOPY WITH PROPOFOL N/A 08/04/2015   Colitis in remission.  No polyps.  Procedure: COLONOSCOPY WITH PROPOFOL;  Surgeon: Rogene [place], MD;  [facility];  Service: Endoscopy;  Laterality: N/A;  cecum time in  0820   time out  0827    total time 7 minutes   COLONOSCOPY WITH PROPOFOL N/A 06/11/2019   cecal and ascending colon  colitis.  Procedure: COLONOSCOPY WITH PROPOFOL;  Surgeon: Rogene [place], MD;  [facility];  Service: Endoscopy;  Laterality: N/A;  730a   COLONOSCOPY WITH PROPOFOL N/A 09/15/2020   Procedure: COLONOSCOPY WITH PROPOFOL;  Surgeon: Harvel Quale, MD;  [facility];  Service: Gastroenterology;  Laterality: N/A;  1030   ESOPHAGEAL DILATION N/A 08/04/2015   Procedure: ESOPHAGEAL DILATION;  Surgeon: Rogene [place], MD;  [facility];  Service: Endoscopy;  Laterality: N/AKelvin [place], no mucousal disruption   ESOPHAGOGASTRODUODENOSCOPY  09/27/2019   Performed for IDA.  Esoph dilation was done but no stricture present.  Mild portal hypertensive gastropathy, o/w normal.  Duodenal bx NEG.  h pylori neg.   ESOPHAGOGASTRODUODENOSCOPY (EGD) WITH ESOPHAGEAL DILATION  12/02/2005   ESOPHAGOGASTRODUODENOSCOPY (EGD) WITH PROPOFOL N/A 08/04/2015   Procedure: ESOPHAGOGASTRODUODENOSCOPY (EGD) WITH PROPOFOL;  Surgeon: Rogene [place], MD;  [facility];  Service: Endoscopy;  Laterality: N/A;  procedure 1   ESOPHAGOGASTRODUODENOSCOPY (EGD) WITH PROPOFOL N/A 09/27/2019   Procedure: ESOPHAGOGASTRODUODENOSCOPY (EGD) WITH PROPOFOL;  Surgeon: Rogene [place], MD;  [facility];  Service: Endoscopy;  Laterality: N/A;  12:10   ESOPHAGOGASTRODUODENOSCOPY (EGD) WITH PROPOFOL N/A 10/31/2021   Procedure: ESOPHAGOGASTRODUODENOSCOPY (EGD) WITH PROPOFOL;  Surgeon: Rogene [place], MD;  [facility];  Service: Endoscopy;  Laterality: N/A;  [place]  01/17/2012   Procedure: FLEXIBLE SIGMOIDOSCOPY;  Surgeon: Rogene [place], MD;  [facility];  Service: Endoscopy;  Laterality: N/A;   GIVENS CAPSULE STUDY N/A 08/03/2019   Procedure: GIVENS CAPSULE STUDY (performed for IDA)->some food debris in stomach and small amount of blood.  Surgeon: Rogene [place], MD;  [facility];  Service: Endoscopy;  Laterality: N/A;  730AM    HEMILAMINOTOMY LUMBAR SPINE Bilateral 09/07/1999   L4-5   KNEE ARTHROSCOPY Right 01/1999; 10/2000   LYSIS OF ADHESION  02/10/2001   MALONEY DILATION  09/27/2019   Procedure: MALONEY DILATION;  Surgeon: Rogene [place], MD;  [facility];  Service: Endoscopy;;   NASAL ENDOSCOPY WITH EPISTAXIS CONTROL Bilateral 01/25/2022   Procedure: NASAL ENDOSCOPY WITH EPISTAXIS CONTROL;  Surgeon: Jerrell Belfast, MD;  Location: [place];  Service: ENT;  Laterality: Bilateral;   NASAL SEPTOPLASTY W/ TURBINOPLASTY Bilateral 01/25/2022   Procedure: NASAL SEPTOPLASTY WITH TURBINATE REDUCTION;  Surgeon: Jerrell Belfast, MD;  Location: [place];  Service: ENT;  Laterality: Bilateral;   [place]; 09/12/2006   TARSAL TUNNEL RELEASE  2002   TRANSTHORACIC ECHOCARDIOGRAM  08/04/2015   EF 60-65%, normal wall motion, mild LVH, mild LA dilation, grd I DD.   TUMOR EXCISION Left 03/21/2003   dorsal 1st web space (hand)   URETEROLYSIS Right 02/10/2001    Outpatient Medications Prior to Visit  Medication Sig Dispense Refill   Accu-Chek Softclix Lancets lancets USE TO CHECK BLOOD GLUCOSE UP TO 6 TIMES DAILY AS DIRECTED 200 each 1   acetaminophen (TYLENOL) 325 MG tablet Take 650 mg by mouth every 6 (six) hours as needed for moderate pain or headache.     amitriptyline (ELAVIL) 25 MG tablet Take 25 mg by mouth in the morning and at bedtime.     blood glucose meter kit and supplies KIT Use up to six times daily as directed. DX. E11.9 1 each 0   canagliflozin (INVOKANA) 100 MG TABS tablet Take 1 tablet (100 mg total) by mouth daily before breakfast. 90 tablet 3   citalopram (CELEXA) 20 MG tablet TAKE 1 TABLET BY MOUTH EVERY DAY (Patient taking differently: Take 20 mg by mouth at bedtime.) 90 tablet 3   dicyclomine (BENTYL) 10 MG capsule TAKE 1 CAPSULE (10 MG TOTAL) BY MOUTH EVERY 8 (EIGHT) HOURS AS NEEDED FOR SPASMS (ABDOMINAL PAIN). 270 capsule 1   Dulaglutide (TRULICITY) 1.5 BD/5.3GD SOPN INJECT 1.5MG ONCE  WEEKLY 18 mL 3   esomeprazole (NEXIUM) 20 MG capsule TAKE 1 CAPSULE BY MOUTH DAILY BEFORE BREAKFAST 90 capsule 3   furosemide (LASIX) 40 MG tablet Take 1 tablet (40 mg total) by mouth daily. 90 tablet 1   gabapentin (NEURONTIN) 600 MG tablet TAKE 1 AND 1/2 TABLETS BY MOUTH TWICE A DAY 270 tablet 3   glucose blood (ACCU-CHEK GUIDE) test strip USE TO CHECK BLOOD GLUCOSE UP TO 6 TIMES DAILY AS DIRECTED 200 strip 1   INFLIXIMAB IV Inject into the vein every 8 (eight) weeks. Remicaid 69m/kg Infusion every 8 weeks (  [facility])     Insulin Pen Needle (B-D UF III MINI PEN NEEDLES) 31G X 5 MM MISC USE TO INJECT INSULINS EQUAL TO 6 TIMES DAILY. 600 each 5   insulin regular human CONCENTRATED (HUMULIN R U-500 KWIKPEN) 500 UNIT/ML KwikPen Inject 125-135 Units into the skin See admin instructions. Inject 110 units with breakfast, 110 units with dinner, and 50 U q evening prn hyperglycemia (Patient taking differently: Inject 110 Units into the skin 3 (three) times daily with meals.) 45 mL 3   lactase (LACTAID) 3000 UNITS tablet Take 3,000 Units by mouth as needed (when eating foods containing dairy).      loperamide (IMODIUM) 2 MG capsule Take 1 capsule (2 mg total) by mouth 2 (two) times daily as needed for diarrhea or loose stools. 90 capsule 5   meclizine (ANTIVERT) 25 MG tablet Take 1 tablet (25 mg total) by mouth 3 (three) times daily as needed for dizziness or nausea. 90 tablet 0   metoprolol succinate (TOPROL-XL) 100 MG 24 hr tablet TAKE 1 TABLET BY MOUTH 2 TIMES DAILY. TAKE WITH OR IMMEDIATELY FOLLOWING A MEAL 180 tablet 3   Pediatric Multivitamins-Iron (FLINTSTONES COMPLETE) 18 MG CHEW Chew 1 tablet by mouth 2 (two) times daily.     potassium chloride (KLOR-CON M10) 10 MEQ tablet Take 1 tablet (10 mEq total) by mouth daily. 180 tablet 0   rivaroxaban (XARELTO) 10 MG TABS tablet Take 10 mg by mouth daily.     ferric carboxymaltose (INJECTAFER) 750 MG/15ML SOLN injection Inject 15 mLs (750 mg  total) into the vein as directed for 1 dose. Repeat in 7 days (Patient not taking: Reported on 01/17/2022) 15 mL 1   No facility-administered medications prior to visit.    Allergies  Allergen Reactions   Omeprazole Anaphylaxis and Swelling    SWELLING OF TONGUE AND THROAT   Actos [Pioglitazone] Swelling and Other (See Comments)    Weight gain, tongue swelling    Benzocaine-Menthol Swelling    SWELLING OF MOUTH   Colesevelam Other (See Comments)    GI UPSET   Flagyl [Metronidazole Hcl] Other (See Comments)    DIAPHORESIS   Metformin And Related Diarrhea   Shrimp [Shellfish Allergy] Itching    OF THROAT AND EARS, if consumed raw   Statins Palpitations   Allevyn Adhesive [Wound Dressings]     Other reaction(s): Unknown   Desipramine Hcl Itching, Nausea Only and Other (See Comments)    "swimmy" headed, ears itched    Hydromorphone Itching   Jardiance [Empagliflozin] Other (See Comments)    weakness   Lactose Intolerance (Gi) Diarrhea    Gas, bloating   Adhesive [Tape] Other (See Comments)    SKIN IRRITATION AND BRUISING   Nisoldipine Itching   Percocet [Oxycodone-Acetaminophen] Itching    ROS As per HPI  PE:    05/23/2022    1:19 PM 03/08/2022   12:12 PM 02/20/2022    1:10 PM  Vitals with BMI  Height _0  _1  _2   Weight 219 lbs 6 oz 223 lbs 226 lbs 3 oz  BMI 42.85 42.35 36.14  Systolic 431 540 086  Diastolic 65 78 67  Pulse 95 68 80   Physical Exam  Gen: Alert, well appearing.  Patient is oriented to person, place, time, and situation. AFFECT: pleasant, lucid thought and speech. CV: irreg irreg, rate 85, no audible murmur, S1 and S2 distant Lungs CTA   LABS:  Last CBC Lab Results  Component Value Date  WBC 4.5 01/22/2022   HGB 12.0 01/22/2022   HCT 39.4 01/22/2022   MCV 96.3 01/22/2022   MCH 29.3 01/22/2022   RDW Not Measured 01/22/2022   PLT 112 (L) 01/22/2022   Lab Results  Component Value Date   IRON 83 11/22/2021   TIBC 363 11/22/2021    FERRITIN 6 (L) 11/22/2021   Lab Results  Component Value Date   VITAMINB12 >2,000 (H) 64/68/0321   Last metabolic panel Lab Results  Component Value Date   GLUCOSE 184 (H) 01/25/2022   NA 136 01/25/2022   K 3.6 01/25/2022   CL 103 01/25/2022   CO2 27 01/25/2022   BUN 8 01/25/2022   CREATININE 0.79 01/25/2022   GFRNONAA >60 01/25/2022   CALCIUM 8.4 (L) 01/25/2022   PROT 6.6 01/25/2022   ALBUMIN 3.1 (L) 01/25/2022   LABGLOB 3.4 08/17/2015   AGRATIO 1.0 08/17/2015   BILITOT 1.0 01/25/2022   ALKPHOS 60 01/25/2022   AST 53 (H) 01/25/2022   ALT 35 01/25/2022   ANIONGAP 6 01/25/2022   Last lipids Lab Results  Component Value Date   CHOL 165 08/21/2021   HDL 34.20 (L) 08/21/2021   LDLCALC 106 (H) 08/21/2021   LDLDIRECT 69.5 01/24/2011   TRIG 122.0 08/21/2021   CHOLHDL 5 08/21/2021   Last hemoglobin A1c Lab Results  Component Value Date   HGBA1C 5.5 01/22/2022   Last thyroid functions Lab Results  Component Value Date   TSH 1.09 08/18/2020   Lab Results  Component Value Date   INR 1.4 (H) 10/16/2021   INR 1.3 (H) 09/13/2020   INR 1.15 08/04/2015   IMPRESSION AND PLAN:  #1 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 Erratic home glucoses, which is typical for her. POC Hba1c is 6.7% today. No changes in meds recommended. Continue Invokana 224 mg a day, Trulicity 1.5 mg weekly, and U-500 insulin 110 units before every meal.  Avoid use of U-500 at bedtime. Check electrolytes and creatinine today.  2.  Iron deficiency anemia--due to significant recurrent epistaxis, which is now resolved status post nasal surgery 4 months ago.  #3 PAF.  Irregular rhythm today.  Rate well controlled on Toprol-XL 100 mg twice daily. She was off Eliquis for little while around the time of all her nosebleed issues but has been back on this for quite a while now. Checking CBC today.  #4 iron deficiency anemia due to chronic blood loss--recurrent epistaxis.   Malabsorption  due to portal gastropathy as well as chronic PPI use has also played a significant role in this. Resolved since surgery 4 months ago. He does not tolerate oral iron. She did get an iron infusion since I last saw her. Check CBC and iron panel today.  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 Portal hypertensive gastropathy on recent EGD October 31, 2021.  No varices. Liver ultrasound with Doppler 11/23/2021 with concern of mass.  Follow-up MR liver January 03, 2022 showed hepatic cirrhosis but no evidence of neoplasm or other abnormality. No sign of liver dysfunction other than mild hypoalbuminemia. Check CMET, platelets, and PT/INR today.  An After Visit Summary was printed and given to the patient.  FOLLOW UP: Return in about 3 months (around 08/23/2022) for annual CPE (fasting).  Signed:  Crissie Sickles, MD           05/23/2022

## 2022-05-24 LAB — IRON,TIBC AND FERRITIN PANEL
%SAT: 23 % (calc) (ref 16–45)
Ferritin: 11 ng/mL — ABNORMAL LOW (ref 16–288)
Iron: 83 ug/dL (ref 45–160)
TIBC: 355 mcg/dL (calc) (ref 250–450)

## 2022-05-25 ENCOUNTER — Other Ambulatory Visit: Payer: Self-pay | Admitting: Family Medicine

## 2022-05-29 ENCOUNTER — Telehealth: Payer: Self-pay | Admitting: *Deleted

## 2022-05-29 NOTE — Telephone Encounter (Signed)
05/16/22 prog note sent to [facility]. The recent labs are for Dr Anitra Lauth and I can't release those, we can only release what we ordered. [facility] may be able to reach out to Dr [facility] office and have them send her recent labs.

## 2022-05-29 NOTE — Telephone Encounter (Signed)
Will fill form for PRN IV iron

## 2022-05-29 NOTE — Telephone Encounter (Signed)
[name] from [facility] called to get new order for injectafer. She was last seen by Dr. Laural [name] 10/16/21. His note copied below from that visit:   History of iron deficiency anemia dating back to 2020.  She had colonoscopy and September 2020 given capsule study in November 2020 and esophagogastroduodenoscopy in December 2020.  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  She has required intermittent iron infusion but not in the last 6 months or so.  She remains on low-dose oral iron.  She had labs done on 05/23/22 ordered by Dr. Anitra Lauth and in his note he recommended she get iron infusion with her rheumatologist office. [name] from [place] [facility] called to get updated order and can put prn if you want so it will be good for one year. And if you agree needs labs and notes sent with order. She does not have an upcoming appt scheduled here.   Fax number [phone] Lori's number [phone] ext [phone].

## 2022-05-29 NOTE — Telephone Encounter (Signed)
Ann, can I get last office visit note printed and most recent labs to fax to [facility] for [place] for [facility]  Fax number [phone].  I will fax over with order and I called Cecille Rubin at [facility] and left her a voicemail letting her know I would be faxing with records tomorrow.

## 2022-05-30 NOTE — Telephone Encounter (Signed)
Order faxed to Cecille Rubin at [facility] rheumatology for injectafer 750 mg x 2 one week apart prn

## 2022-06-18 ENCOUNTER — Other Ambulatory Visit: Payer: Self-pay | Admitting: Cardiology

## 2022-06-23 ENCOUNTER — Other Ambulatory Visit: Payer: Self-pay | Admitting: Family Medicine

## 2022-06-24 ENCOUNTER — Encounter (HOSPITAL_BASED_OUTPATIENT_CLINIC_OR_DEPARTMENT_OTHER): Payer: Self-pay | Admitting: Obstetrics & Gynecology

## 2022-06-24 ENCOUNTER — Ambulatory Visit (INDEPENDENT_AMBULATORY_CARE_PROVIDER_SITE_OTHER): Payer: Medicare PPO | Admitting: Obstetrics & Gynecology

## 2022-06-24 VITALS — BP 128/54 | HR 69 | Ht 62.0 in | Wt 224.2 lb

## 2022-06-24 DIAGNOSIS — Z90710 Acquired absence of both cervix and uterus: Secondary | ICD-10-CM

## 2022-06-24 DIAGNOSIS — Z01419 Encounter for gynecological examination (general) (routine) without abnormal findings: Secondary | ICD-10-CM | POA: Diagnosis not present

## 2022-06-24 DIAGNOSIS — Z780 Asymptomatic menopausal state: Secondary | ICD-10-CM

## 2022-06-24 DIAGNOSIS — K5190 Ulcerative colitis, unspecified, without complications: Secondary | ICD-10-CM

## 2022-06-24 DIAGNOSIS — Z1231 Encounter for screening mammogram for malignant neoplasm of breast: Secondary | ICD-10-CM | POA: Diagnosis not present

## 2022-06-24 NOTE — Progress Notes (Signed)
76 y.o. G1P1 Married White or Caucasian female here for breast and pelvic exam.  Has been diagnosed with NASH.  Followed by GI.    Denies vaginal bleeding.  No LMP recorded. Patient has had a hysterectomy.          Sexually active: No.  H/O STD:  no  Health Maintenance: PCP:  Dr. Anitra Lauth.  Last wellness appt was 8/24.  Did blood work at that appt:   Vaccines are up to date:  yes Colonoscopy:  09/15/2020 MMG:  04/29/2019 Negative.  Pt willing to do another one. BMD:  04/29/2019 Osteopenia Last pap smear:  h/o hysterectomy H/o abnormal pap smear:  no    reports that she has never smoked. She has never used smokeless tobacco. She reports that she does not currently use alcohol. She reports that she does not use drugs.  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 ? to get liver bx if she gets bariatric surgery? Mild portal hypertensive gastropathy on EGD 08/2019.   Dysrhythmia    Fibromyalgia    GAD (generalized anxiety disorder)    GERD    History of hiatal hernia    History of iron deficiency anemia 12/2018   Possibly inadequate absorption secondary to chronic/long term PPI therapy + portal hypertensive gastroduodenopathy. No GIB found on EGD, colonosc, and givens. Iron infusion approx 02/2021. [facility].   History of thrombocytopenia 12/2011   Hyperlipidemia    Intolerant of statins   HYPERTENSION    IBS (irritable bowel syndrome)    -D.  Good response to bentyl and imodium as of 06/2018 GI f/u.   IDDM (insulin dependent diabetes mellitus)    with DPN (managed by Dr. Cruzita Lederer but then in 2018 pt preferred to have me manage for her convenience)   Limited mobility    Requires a walker for arthritic pain, widespread musculoskeletal pain, and neuropathic pain.   Morbid obesity ([place])    As of 11/2018, pt considering sleeve  gastectomy vs bipass as of eval by Dr. De Burrs considering as of 12/2019.   Nonalcoholic steatohepatitis    Viral Hep screens NEG.  CT 2015.  Transaminasemia.  U/S 07/2018 showed early changes of cirrhosis.   OSA (obstructive sleep apnea) 09/14/2015   sleep study 09/07/15: severe obstructive sleep apnea with an AHI of 72 and SaO2 low of 75%.>referred to sleep MD   Osteoarthritis    hips, shoulders, knees   PAF (paroxysmal atrial fibrillation) ([place])    One documented episode (after getting EGD 2016).  Was on amiodarone x 3 mo.  Rate control with metoprolol + anticoag with xarelto.    PONV (postoperative nausea and vomiting)    Recurrent epistaxis    Granuloma in L nare cauterized by ENT 04/2020. Another cautery 06/2020   Small fiber neuropathy    Due to DM.  Symmetric hands and feet tingling/numbness.   Ulcerative colitis ([place])    Remicade infusion Q 8 weeks: in clinical and endoscopic remission as of 12/2018 GI f/u.  06/11/19 rpt colonoscopy->cecal and ascending colon colitis.    Past Surgical History:  Procedure Laterality Date   ABDOMINAL HYSTERECTOMY  1980   Paps no longer indicated.   BACK SURGERY     BACTERIAL OVERGROWTH TEST N/A 07/13/2015   Procedure: BACTERIAL OVERGROWTH TEST;  Surgeon: Rogene Houston, MD;  [facility];  Service: Endoscopy;  Laterality: N/A;  730     BILATERAL SALPINGOOPHORECTOMY  02/10/2001   BIOPSY  06/11/2019   Procedure: BIOPSY;  Surgeon: Rogene Houston, MD;  [facility];  Service: Endoscopy;;  colon   BIOPSY  09/27/2019   Procedure: BIOPSY;  Surgeon: Rogene Houston, MD;  [facility];  Service: Endoscopy;;  gastric duodenal   BIOPSY  09/15/2020   Procedure: BIOPSY;  Surgeon: Harvel Quale, MD;  [facility];  Service: Gastroenterology;;   BREAST REDUCTION SURGERY  1994   bilat   BREAST SURGERY     CARDIOVASCULAR STRESS TEST  07/2010   Lexiscan myoview: normal   CARPAL TUNNEL RELEASE  Right 1996   CARPAL TUNNEL RELEASE Left 03/21/2003   CARPAL TUNNEL RELEASE Right 05/04/2013   Procedure: CARPAL TUNNEL RELEASE;  Surgeon: Cammie Sickle., MD;  Location: [place];  Service: Orthopedics;  Laterality: Right;   CARPAL TUNNEL RELEASE Left 09/21/2013   Procedure: LEFT CARPAL TUNNEL RELEASE;  Surgeon: Cammie Sickle., MD;  Location: [place];  Service: Orthopedics;  Laterality: Left;   CHOLECYSTECTOMY     COLONOSCOPY WITH PROPOFOL N/A 08/04/2015   Colitis in remission.  No polyps.  Procedure: COLONOSCOPY WITH PROPOFOL;  Surgeon: Rogene Houston, MD;  [facility];  Service: Endoscopy;  Laterality: N/A;  cecum time in  0820   time out  0827    total time 7 minutes   COLONOSCOPY WITH PROPOFOL N/A 06/11/2019   cecal and ascending colon colitis.  Procedure: COLONOSCOPY WITH PROPOFOL;  Surgeon: Rogene Houston, MD;  [facility];  Service: Endoscopy;  Laterality: N/A;  730a   COLONOSCOPY WITH PROPOFOL N/A 09/15/2020   Procedure: COLONOSCOPY WITH PROPOFOL;  Surgeon: Harvel Quale, MD;  [facility];  Service: Gastroenterology;  Laterality: N/A;  1030   ESOPHAGEAL DILATION N/A 08/04/2015   Procedure: ESOPHAGEAL DILATION;  Surgeon: Rogene Houston, MD;  [facility];  Service: Endoscopy;  Laterality: N/AKelvin Cellar, no mucousal disruption   ESOPHAGOGASTRODUODENOSCOPY  09/27/2019   Performed for IDA.  Esoph dilation was done but no stricture present.  Mild portal hypertensive gastropathy, o/w normal.  Duodenal bx NEG.  h pylori neg.   ESOPHAGOGASTRODUODENOSCOPY (EGD) WITH ESOPHAGEAL DILATION  12/02/2005   ESOPHAGOGASTRODUODENOSCOPY (EGD) WITH PROPOFOL N/A 08/04/2015   Procedure: ESOPHAGOGASTRODUODENOSCOPY (EGD) WITH PROPOFOL;  Surgeon: Rogene Houston, MD;  [facility];  Service: Endoscopy;  Laterality: N/A;  procedure 1   ESOPHAGOGASTRODUODENOSCOPY (EGD) WITH PROPOFOL N/A 09/27/2019   Procedure:  ESOPHAGOGASTRODUODENOSCOPY (EGD) WITH PROPOFOL;  Surgeon: Rogene Houston, MD;  [facility];  Service: Endoscopy;  Laterality: N/A;  12:10   ESOPHAGOGASTRODUODENOSCOPY (EGD) WITH PROPOFOL N/A 10/31/2021   Procedure: ESOPHAGOGASTRODUODENOSCOPY (EGD) WITH PROPOFOL;  Surgeon: Rogene Houston, MD;  [facility];  Service: Endoscopy;  Laterality: N/A;  [place]  01/17/2012   Procedure: FLEXIBLE SIGMOIDOSCOPY;  Surgeon: Rogene Houston, MD;  [facility];  Service: Endoscopy;  Laterality: N/A;   GIVENS CAPSULE STUDY N/A 08/03/2019   Procedure: GIVENS CAPSULE STUDY (performed for IDA)->some food debris in stomach and small amount of blood.  Surgeon: Rogene Houston, MD;  [facility];  Service: Endoscopy;  Laterality: N/A;  730AM   HEMILAMINOTOMY LUMBAR SPINE Bilateral 09/07/1999   L4-5   KNEE ARTHROSCOPY Right 01/1999; 10/2000   LYSIS OF ADHESION  02/10/2001   MALONEY DILATION  09/27/2019   Procedure: MALONEY DILATION;  Surgeon: Rogene Houston, MD;  [facility];  Service: Endoscopy;;   NASAL ENDOSCOPY WITH EPISTAXIS CONTROL Bilateral 01/25/2022   Procedure: NASAL ENDOSCOPY WITH EPISTAXIS CONTROL;  Surgeon: Jerrell Belfast, MD;  Location: [place];  Service: ENT;  Laterality: Bilateral;   NASAL SEPTOPLASTY W/ TURBINOPLASTY Bilateral 01/25/2022   Procedure: NASAL SEPTOPLASTY WITH TURBINATE REDUCTION;  Surgeon: Jerrell Belfast, MD;  Location: [place];  Service: ENT;  Laterality: Bilateral;   [place]; 09/12/2006   TARSAL TUNNEL RELEASE  2002   TRANSTHORACIC ECHOCARDIOGRAM  08/04/2015   EF 60-65%, normal wall motion, mild LVH, mild LA dilation, grd I DD.   TUMOR EXCISION Left 03/21/2003   dorsal 1st web space (hand)   URETEROLYSIS Right 02/10/2001    Current Outpatient Medications  Medication Sig Dispense Refill   Accu-Chek Softclix Lancets lancets USE TO CHECK BLOOD GLUCOSE UP TO 6 TIMES DAILY AS DIRECTED 200  each 1   acetaminophen (TYLENOL) 325 MG tablet Take 650 mg by mouth every 6 (six) hours as needed for moderate pain or headache.     amitriptyline (ELAVIL) 25 MG tablet Take 25 mg by mouth in the morning and at bedtime.     blood glucose meter kit and supplies KIT Use up to six times daily as directed. DX. E11.9 1 each 0   citalopram (CELEXA) 20 MG tablet TAKE 1 TABLET BY MOUTH EVERY DAY (Patient taking differently: Take 20 mg by mouth at bedtime.) 90 tablet 3   dicyclomine (BENTYL) 10 MG capsule TAKE 1 CAPSULE (10 MG TOTAL) BY MOUTH EVERY 8 (EIGHT) HOURS AS NEEDED FOR SPASMS (ABDOMINAL PAIN). 270 capsule 1   Dulaglutide (TRULICITY) 1.5 AY/3.0ZS SOPN INJECT 1.5MG ONCE WEEKLY 18 mL 3   esomeprazole (NEXIUM) 20 MG capsule TAKE 1 CAPSULE BY MOUTH DAILY BEFORE BREAKFAST 90 capsule 3   ferric carboxymaltose (INJECTAFER) 750 MG/15ML SOLN injection Inject 15 mLs (750 mg total) into the vein as directed for 1 dose. Repeat in 7 days 15 mL 1   furosemide (LASIX) 40 MG tablet Take 1 tablet (40 mg total) by mouth daily. 90 tablet 1   gabapentin (NEURONTIN) 600 MG tablet TAKE 1 AND 1/2 TABLETS BY MOUTH TWICE A DAY 270 tablet 3   glucose blood (ACCU-CHEK GUIDE) test strip USE TO CHECK BLOOD GLUCOSE UP TO 6 TIMES DAILY AS DIRECTED 200 strip 1   INFLIXIMAB IV Inject into the vein every 8 (eight) weeks. Remicaid 13m/kg [facility]     Insulin Pen Needle (B-D UF III MINI PEN NEEDLES) 31G X 5 MM MISC USE TO INJECT INSULINS EQUAL TO 6 TIMES DAILY. 600 each 5   insulin regular human CONCENTRATED (HUMULIN R U-500 KWIKPEN) 500 UNIT/ML KwikPen Inject 125-135 Units into the skin See admin instructions. Inject 110 units with breakfast, 110 units with dinner, and 50 U q evening prn hyperglycemia (Patient taking differently: Inject 110 Units into the skin 3 (three) times daily with meals.) 45 mL 3   INVOKANA 100 MG TABS tablet TAKE 1 TABLET BY MOUTH DAILY BEFORE BREAKFAST. 90 tablet 0    lactase (LACTAID) 3000 UNITS tablet Take 3,000 Units by mouth as needed (when eating foods containing dairy).      loperamide (IMODIUM) 2 MG capsule Take 1 capsule (2 mg total) by mouth 2 (two) times daily as needed for diarrhea or loose stools. 90 capsule 5  meclizine (ANTIVERT) 25 MG tablet Take 1 tablet (25 mg total) by mouth 3 (three) times daily as needed for dizziness or nausea. 90 tablet 0   metoprolol succinate (TOPROL-XL) 100 MG 24 hr tablet TAKE 1 TABLET BY MOUTH 2 TIMES DAILY. TAKE WITH OR IMMEDIATELY FOLLOWING A MEAL 180 tablet 3   Pediatric Multivitamins-Iron (FLINTSTONES COMPLETE) 18 MG CHEW Chew 1 tablet by mouth 2 (two) times daily.     potassium chloride (KLOR-CON M10) 10 MEQ tablet Take 1 tablet (10 mEq total) by mouth daily. 180 tablet 0   rivaroxaban (XARELTO) 10 MG TABS tablet Take 10 mg by mouth daily.     No current facility-administered medications for this visit.    Family History  Problem Relation Age of Onset   Diabetes Mother    Hypertension Mother    Heart attack Father        Mid 37's   Heart disease Father    Lung disease Father        spot on lung; had lung surgery   Alcohol abuse Other    Hypertension Son    Diabetes Son    Emphysema Maternal Grandfather    Asthma Maternal Grandfather    Colon cancer Paternal Grandfather    Stomach cancer Paternal Grandmother    Neuropathy Neg Hx     Review of Systems  Constitutional: Negative.   Genitourinary: Negative.     Exam:   BP (!) 128/54 (BP Location: Right Arm, Patient Position: Sitting, Cuff Size: Large)   Pulse 69   Ht _0  (1.575 m) Comment: Reported  Wt 224 lb 3.2 oz (101.7 kg)   BMI 41.01 kg/m   Height: _1  (157.5 cm) (Reported)  General appearance: alert, cooperative and appears stated age Breasts: normal appearance, no masses or tenderness Abdomen: soft, non-tender; bowel sounds normal; no masses,  no organomegaly Lymph nodes: Cervical, supraclavicular, and axillary nodes normal.  No  abnormal inguinal nodes palpated Neurologic: Grossly normal  Pelvic: External genitalia:  no lesions              Urethra:  normal appearing urethra with no masses, tenderness or lesions              Bartholins and Skenes: normal                 Vagina: normal appearing vagina with atrophic changes and no discharge, no lesions              Cervix: absent              Pap taken: No. Bimanual Exam:  Uterus:  uterus absent              Adnexa: no mass, fullness, tenderness               Rectovaginal: Confirms               Anus:  normal sphincter tone, no lesions  Chaperone, Octaviano Batty, CMA, was present for exam.  Assessment/Plan: 1. Encntr for gyn exam (general) (routine) w/o abn findings - Pap smear not indicated - Mammogram guidelines reviewed - Colonoscopy done 2022 - Bone mineral density 2020.  Mild osteopenia noted. - lab work done done with PCP.   - vaccines reviewed/updated.  Pt aware I do not have the 65+ flu vaccination at this time  2. Postmenopausal - no HRT  3. H/O: hysterectomy  4. Ulcerative colitis without complications, unspecified location [facility]) - followed by GI

## 2022-07-03 DIAGNOSIS — D509 Iron deficiency anemia, unspecified: Secondary | ICD-10-CM | POA: Diagnosis not present

## 2022-07-11 DIAGNOSIS — K5180 Other ulcerative colitis without complications: Secondary | ICD-10-CM | POA: Diagnosis not present

## 2022-07-17 DIAGNOSIS — D509 Iron deficiency anemia, unspecified: Secondary | ICD-10-CM | POA: Diagnosis not present

## 2022-07-18 ENCOUNTER — Other Ambulatory Visit: Payer: Self-pay | Admitting: Family Medicine

## 2022-07-18 DIAGNOSIS — E1140 Type 2 diabetes mellitus with diabetic neuropathy, unspecified: Secondary | ICD-10-CM

## 2022-07-29 ENCOUNTER — Other Ambulatory Visit: Payer: Self-pay | Admitting: Cardiology

## 2022-07-29 ENCOUNTER — Other Ambulatory Visit: Payer: Self-pay | Admitting: Family Medicine

## 2022-07-29 NOTE — Telephone Encounter (Signed)
Discrepancy in chart.  Med list states pt is on Xarelto 20m daily.  Refill request for for Xarelto 271mdaily.  Called pt to confirm but no answer and mailbox full.  Will call back later.  07/31/22  Called pt.  No Answer.  Mailbox full.  11/1 @ 4:22pm  No answer.  Mailbox full.

## 2022-08-01 ENCOUNTER — Telehealth: Payer: Self-pay

## 2022-08-01 NOTE — Telephone Encounter (Signed)
Still unable to reach pt by phone and mail box is full.  Denied refill due to dose discrepancy between chart and refill request and not being able to verify with patient.

## 2022-08-01 NOTE — Telephone Encounter (Signed)
I tried calling patient to discuss Xarelto refill, but mailbox full.

## 2022-08-26 ENCOUNTER — Other Ambulatory Visit: Payer: Self-pay | Admitting: Family Medicine

## 2022-08-27 ENCOUNTER — Telehealth (INDEPENDENT_AMBULATORY_CARE_PROVIDER_SITE_OTHER): Payer: Self-pay | Admitting: *Deleted

## 2022-08-27 ENCOUNTER — Telehealth: Payer: Self-pay | Admitting: Cardiology

## 2022-08-27 ENCOUNTER — Other Ambulatory Visit: Payer: Self-pay

## 2022-08-27 DIAGNOSIS — E1140 Type 2 diabetes mellitus with diabetic neuropathy, unspecified: Secondary | ICD-10-CM

## 2022-08-27 MED ORDER — TRULICITY 1.5 MG/0.5ML ~~LOC~~ SOAJ: SUBCUTANEOUS | 3 refills | Status: DC

## 2022-08-27 MED ORDER — HUMULIN R U-500 KWIKPEN 500 UNIT/ML ~~LOC~~ SOPN: PEN_INJECTOR | SUBCUTANEOUS | 0 refills | Status: DC

## 2022-08-27 NOTE — Telephone Encounter (Signed)
Pt c/o medication issue:  1. Name of Medication: rivaroxaban (XARELTO) 10 MG TABS tablet   2. How are you currently taking this medication (dosage and times per day)?   Take 10 mg by mouth daily.    3. Are you having a reaction (difficulty breathing--STAT)? no  4. What is your medication issue? Patient states she has been off of the xarelto for a month, she wants to know if she should go back on it.

## 2022-08-27 NOTE — Telephone Encounter (Signed)
Tried to call pt, voicemail is full unable to leave message. Will await call back

## 2022-08-27 NOTE — Telephone Encounter (Signed)
I tried calling the patient no answer and no vm available.

## 2022-08-27 NOTE — Telephone Encounter (Signed)
Patient left voicemail that side effects from her UC are starting to become unbearable.   Last OV with dr Laural [name] 10/16/21 and she is on infliximab every 8 weeks through [facility] rheumatology. I called to get more information and no answer. Will try to call back later.

## 2022-08-28 ENCOUNTER — Encounter: Payer: Self-pay | Admitting: Family Medicine

## 2022-08-28 ENCOUNTER — Ambulatory Visit: Payer: Medicare PPO | Admitting: Family Medicine

## 2022-08-28 VITALS — BP 137/76 | HR 97 | Temp 97.7°F | Ht 62.0 in | Wt 225.2 lb

## 2022-08-28 DIAGNOSIS — Z23 Encounter for immunization: Secondary | ICD-10-CM | POA: Diagnosis not present

## 2022-08-28 DIAGNOSIS — E7800 Pure hypercholesterolemia, unspecified: Secondary | ICD-10-CM

## 2022-08-28 DIAGNOSIS — E1142 Type 2 diabetes mellitus with diabetic polyneuropathy: Secondary | ICD-10-CM

## 2022-08-28 DIAGNOSIS — Z7901 Long term (current) use of anticoagulants: Secondary | ICD-10-CM

## 2022-08-28 DIAGNOSIS — R300 Dysuria: Secondary | ICD-10-CM | POA: Diagnosis not present

## 2022-08-28 DIAGNOSIS — D500 Iron deficiency anemia secondary to blood loss (chronic): Secondary | ICD-10-CM

## 2022-08-28 LAB — COMPREHENSIVE METABOLIC PANEL
ALT: 49 U/L — ABNORMAL HIGH (ref 0–35)
AST: 64 U/L — ABNORMAL HIGH (ref 0–37)
Albumin: 3.4 g/dL — ABNORMAL LOW (ref 3.5–5.2)
Alkaline Phosphatase: 112 U/L (ref 39–117)
BUN: 10 mg/dL (ref 6–23)
CO2: 32 mEq/L (ref 19–32)
Calcium: 8.2 mg/dL — ABNORMAL LOW (ref 8.4–10.5)
Chloride: 102 mEq/L (ref 96–112)
Creatinine, Ser: 0.67 mg/dL (ref 0.40–1.20)
GFR: 85.74 mL/min (ref >60.00)
Glucose, Bld: 197 mg/dL — ABNORMAL HIGH (ref 70–99)
Potassium: 3.5 mEq/L (ref 3.5–5.1)
Sodium: 140 mEq/L (ref 135–145)
Total Bilirubin: 1 mg/dL (ref 0.2–1.2)
Total Protein: 7.3 g/dL (ref 6.0–8.3)

## 2022-08-28 LAB — LIPID PANEL
Cholesterol: 190 mg/dL (ref 0–200)
HDL: 41.1 mg/dL (ref >39.00)
LDL Cholesterol: 121 mg/dL — ABNORMAL HIGH (ref 0–99)
NonHDL: 149.23
Total CHOL/HDL Ratio: 5
Triglycerides: 139 mg/dL (ref 0.0–149.0)
VLDL: 27.8 mg/dL (ref 0.0–40.0)

## 2022-08-28 LAB — POCT URINALYSIS DIPSTICK
Bilirubin, UA: NEGATIVE
Glucose, UA: POSITIVE — AB
Ketones, UA: NEGATIVE
Leukocytes, UA: NEGATIVE
Nitrite, UA: NEGATIVE
Protein, UA: NEGATIVE
Spec Grav, UA: 1.015 (ref 1.010–1.025)
Urobilinogen, UA: 1 E.U./dL
pH, UA: 6 (ref 5.0–8.0)

## 2022-08-28 LAB — CBC
HCT: 43.5 % (ref 36.0–46.0)
Hemoglobin: 15.2 g/dL — ABNORMAL HIGH (ref 12.0–15.0)
MCHC: 34.9 g/dL (ref 30.0–36.0)
MCV: 95.8 fl (ref 78.0–100.0)
Platelets: 110 10*3/uL — ABNORMAL LOW (ref 150.0–400.0)
RBC: 4.54 Mil/uL (ref 3.87–5.11)
RDW: 20.9 % — ABNORMAL HIGH (ref 11.5–15.5)
WBC: 5.8 10*3/uL (ref 4.0–10.5)

## 2022-08-28 LAB — MICROALBUMIN / CREATININE URINE RATIO
Creatinine,U: 66.5 mg/dL
Microalb Creat Ratio: 1.4 mg/g (ref 0.0–30.0)
Microalb, Ur: 0.9 mg/dL (ref 0.0–1.9)

## 2022-08-28 LAB — POCT GLYCOSYLATED HEMOGLOBIN (HGB A1C)
HbA1c POC (<> result, manual entry): 6.1 % (ref 4.0–5.6)
HbA1c, POC (controlled diabetic range): 6.1 % (ref 0.0–7.0)
HbA1c, POC (prediabetic range): 6.1 % (ref 5.7–6.4)
Hemoglobin A1C: 6.1 % — AB (ref 4.0–5.6)

## 2022-08-28 MED ORDER — RIVAROXABAN 10 MG PO TABS: 10.0000 mg | ORAL_TABLET | Freq: Every day | ORAL | 0 refills | Status: DC

## 2022-08-28 MED ORDER — SULFAMETHOXAZOLE-TRIMETHOPRIM 800-160 MG PO TABS: 1.0000 | ORAL_TABLET | Freq: Two times a day (BID) | ORAL | 0 refills | Status: DC

## 2022-08-28 NOTE — Progress Notes (Signed)
OFFICE VISIT  08/28/2022  CC:  Chief Complaint  Patient presents with   Diabetes   Hyperlipidemia    HPI:    Patient is a 76 y.o. female who presents for 9-monthfollow-up diabetes, iron deficiency anemia, and hyperlipidemia. 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 Erratic home glucoses, which is typical for her. POC Hba1c is 6.7% today. No changes in meds recommended. Continue Invokana 1956mg a day, Trulicity 1.5 mg weekly, and U-500 insulin 110 units before every meal.  Avoid use of U-500 at bedtime. Check electrolytes and creatinine today.   2.  Iron deficiency anemia--due to significant recurrent epistaxis, which is now resolved status post nasal surgery 4 months ago.   #3 PAF.  Irregular rhythm today.  Rate well controlled on Toprol-XL 100 mg twice daily. She was off Eliquis for little while around the time of all her nosebleed issues but has been back on this for quite a while now. Checking CBC today.   #4 iron deficiency anemia due to chronic blood loss--recurrent epistaxis.   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 Resolved since surgery 4 months ago. He does not tolerate oral iron. She did get an iron infusion since I last saw her. Check CBC and iron panel today.   #5 NASH cirrhosis---followed by Dr. RLaural [name] Portal hypertensive gastropathy on recent EGD October 31, 2021.  No varices. Liver ultrasound with Doppler 11/23/2021 with concern of mass.  Follow-up MR liver January 03, 2022 showed hepatic cirrhosis but no evidence of neoplasm or other abnormality. No sign of liver dysfunction other than mild hypoalbuminemia. Check CMET, platelets, and PT/INR today."  INTERIM HX: Feeling fine.  No acute concerns. Home gluc's erratic as per her usual, ranging from 85 to 285 (fasting and postprandial), no hypoglyc.  Got another Iron infusion since I last saw her---felt  better. No nosebleeds or blood in urine or stool. Has been off xarelto x 6 wks b/c of difficulty getting refilled.   No burning or numbness in feet but feels this in all fingertips (longstanding).  Has had burning with urination and urinary urgency for 1 wk. Chronic bilat LE edema unchanged.  ROS as above, plus--> no fevers, no CP, no SOB, no wheezing, no cough, no dizziness, no HAs, no rashes, no melena/hematochezia.  No polyuria or polydipsia.  No myalgias or arthralgias.  No focal weakness, paresthesias, or tremors.  No acute vision or hearing abnormalities.    No recent changes in lower legs. No n/v/d or abd pain.  No palpitations.     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 ? to get liver bx if she gets bariatric surgery? Mild portal hypertensive gastropathy on EGD 08/2019.   Dysrhythmia    Fibromyalgia    GAD (generalized anxiety disorder)    GERD    History of hiatal hernia    History of iron deficiency anemia 12/2018   Possibly inadequate absorption secondary to chronic/long term PPI therapy + portal hypertensive gastroduodenopathy. No GIB found on EGD, colonosc, and givens. Iron infusion approx 02/2021. [facility].   History of thrombocytopenia 12/2011   Hyperlipidemia    Intolerant of statins   HYPERTENSION    IBS (irritable bowel syndrome)    -D.  Good response  to bentyl and imodium as of 06/2018 GI f/u.   IDDM (insulin dependent diabetes mellitus)    with DPN (managed by Dr. Cruzita Lederer but then in 2018 pt preferred to have me manage for her convenience)   Limited mobility    Requires a walker for arthritic pain, widespread musculoskeletal pain, and neuropathic pain.   Morbid obesity ([place])    As of 11/2018, pt considering sleeve gastectomy vs bipass as of eval by Dr. De Burrs considering as of 12/2019.    Nonalcoholic steatohepatitis    Viral Hep screens NEG.  CT 2015.  Transaminasemia.  U/S 07/2018 showed early changes of cirrhosis.   OSA (obstructive sleep apnea) 09/14/2015   sleep study 09/07/15: severe obstructive sleep apnea with an AHI of 72 and SaO2 low of 75%.>referred to sleep MD   Osteoarthritis    hips, shoulders, knees   PAF (paroxysmal atrial fibrillation) ([place])    One documented episode (after getting EGD 2016).  Was on amiodarone x 3 mo.  Rate control with metoprolol + anticoag with xarelto.    PONV (postoperative nausea and vomiting)    Recurrent epistaxis    Granuloma in L nare cauterized by ENT 04/2020. Another cautery 06/2020   Small fiber neuropathy    Due to DM.  Symmetric hands and feet tingling/numbness.   Ulcerative colitis ([place])    Remicade infusion Q 8 weeks: in clinical and endoscopic remission as of 12/2018 GI f/u.  06/11/19 rpt colonoscopy->cecal and ascending colon colitis.    Past Surgical History:  Procedure Laterality Date   ABDOMINAL HYSTERECTOMY  1980   Paps no longer indicated.   BACK SURGERY     BACTERIAL OVERGROWTH TEST N/A 07/13/2015   Procedure: BACTERIAL OVERGROWTH TEST;  Surgeon: Rogene Houston, MD;  [facility];  Service: Endoscopy;  Laterality: N/A;  730     BILATERAL SALPINGOOPHORECTOMY  02/10/2001   BIOPSY  06/11/2019   Procedure: BIOPSY;  Surgeon: Rogene Houston, MD;  [facility];  Service: Endoscopy;;  colon   BIOPSY  09/27/2019   Procedure: BIOPSY;  Surgeon: Rogene Houston, MD;  [facility];  Service: Endoscopy;;  gastric duodenal   BIOPSY  09/15/2020   Procedure: BIOPSY;  Surgeon: Harvel Quale, MD;  [facility];  Service: Gastroenterology;;   BREAST REDUCTION SURGERY  1994   bilat   BREAST SURGERY     CARDIOVASCULAR STRESS TEST  07/2010   Lexiscan myoview: normal   CARPAL TUNNEL RELEASE Right 1996   CARPAL TUNNEL RELEASE Left 03/21/2003   CARPAL TUNNEL RELEASE Right  05/04/2013   Procedure: CARPAL TUNNEL RELEASE;  Surgeon: Cammie Sickle., MD;  Location: [facility];  Service: Orthopedics;  Laterality: Right;   CARPAL TUNNEL RELEASE Left 09/21/2013   Procedure: LEFT CARPAL TUNNEL RELEASE;  Surgeon: Cammie Sickle., MD;  Location: [facility];  Service: Orthopedics;  Laterality: Left;   CHOLECYSTECTOMY     COLONOSCOPY WITH PROPOFOL N/A 08/04/2015   Colitis in remission.  No polyps.  Procedure: COLONOSCOPY WITH PROPOFOL;  Surgeon: Rogene Houston, MD;  [facility];  Service: Endoscopy;  Laterality: N/A;  cecum time in  0820   time out  0827    total time 7 minutes   COLONOSCOPY WITH PROPOFOL N/A 06/11/2019   cecal and ascending colon colitis.  Procedure: COLONOSCOPY WITH PROPOFOL;  Surgeon: Rogene Houston, MD;  [facility];  Service: Endoscopy;  Laterality:  N/A;  730a   COLONOSCOPY WITH PROPOFOL N/A 09/15/2020   Procedure: COLONOSCOPY WITH PROPOFOL;  Surgeon: Harvel Quale, MD;  [facility];  Service: Gastroenterology;  Laterality: N/A;  1030   ESOPHAGEAL DILATION N/A 08/04/2015   Procedure: ESOPHAGEAL DILATION;  Surgeon: Rogene Houston, MD;  [facility];  Service: Endoscopy;  Laterality: N/AK[place]vin [place], no mucousal disruption   ESOPHAGOGASTRODUODENOSCOPY  09/27/2019   Performed for IDA.  Esoph dilation was done but no stricture present.  Mild portal hypertensive gastropathy, o/w normal.  Duodenal bx NEG.  h pylori neg.   ESOPHAGOGASTRODUODENOSCOPY (EGD) WITH ESOPHAGEAL DILATION  12/02/2005   ESOPHAGOGASTRODUODENOSCOPY (EGD) WITH PROPOFOL N/A 08/04/2015   Procedure: ESOPHAGOGASTRODUODENOSCOPY (EGD) WITH PROPOFOL;  Surgeon: Rogene Houston, MD;  [facility];  Service: Endoscopy;  Laterality: N/A;  procedure 1   ESOPHAGOGASTRODUODENOSCOPY (EGD) WITH PROPOFOL N/A 09/27/2019   Procedure: ESOPHAGOGASTRODUODENOSCOPY (EGD) WITH PROPOFOL;  Surgeon: Rogene Houston, MD;   [facility];  Service: Endoscopy;  Laterality: N/A;  12:10   ESOPHAGOGASTRODUODENOSCOPY (EGD) WITH PROPOFOL N/A 10/31/2021   Procedure: ESOPHAGOGASTRODUODENOSCOPY (EGD) WITH PROPOFOL;  Surgeon: Rogene Houston, MD;  [facility];  Service: Endoscopy;  Laterality: N/A;  [place]  01/17/2012   Procedure: FLEXIBLE SIGMOIDOSCOPY;  Surgeon: Rogene Houston, MD;  [facility];  Service: Endoscopy;  Laterality: N/A;   GIVENS CAPSULE STUDY N/A 08/03/2019   Procedure: GIVENS CAPSULE STUDY (performed for IDA)->some food debris in stomach and small amount of blood.  Surgeon: Rogene Houston, MD;  [facility];  Service: Endoscopy;  Laterality: N/A;  730AM   HEMILAMINOTOMY LUMBAR SPINE Bilateral 09/07/1999   L4-5   KNEE ARTHROSCOPY Right 01/1999; 10/2000   LYSIS OF ADHESION  02/10/2001   MALONEY DILATION  09/27/2019   Procedure: MALONEY DILATION;  Surgeon: Rogene Houston, MD;  [facility];  Service: Endoscopy;;   NASAL ENDOSCOPY WITH EPISTAXIS CONTROL Bilateral 01/25/2022   Procedure: NASAL ENDOSCOPY WITH EPISTAXIS CONTROL;  Surgeon: Jerrell Belfast, MD;  Location: [facility];  Service: ENT;  Laterality: Bilateral;   NASAL SEPTOPLASTY W/ TURBINOPLASTY Bilateral 01/25/2022   Procedure: NASAL SEPTOPLASTY WITH TURBINATE REDUCTION;  Surgeon: Jerrell Belfast, MD;  Location: [facility];  Service: ENT;  Laterality: Bilateral;   [place]; 09/12/2006   TARSAL TUNNEL RELEASE  2002   TRANSTHORACIC ECHOCARDIOGRAM  08/04/2015   EF 60-65%, normal wall motion, mild LVH, mild LA dilation, grd I DD.   TUMOR EXCISION Left 03/21/2003   dorsal 1st web space (hand)   URETEROLYSIS Right 02/10/2001    Outpatient Medications Prior to Visit  Medication Sig Dispense Refill   Accu-Chek Softclix Lancets lancets USE TO CHECK BLOOD GLUCOSE UP TO 6 TIMES DAILY AS DIRECTED 200 each 1   acetaminophen (TYLENOL) 325 MG tablet Take 650 mg by mouth every  6 (six) hours as needed for moderate pain or headache.     amitriptyline (ELAVIL) 25 MG tablet Take 25 mg by mouth in the morning and at bedtime.     blood glucose meter kit and supplies KIT Use up to six times daily as directed. DX. E11.9 1 each 0   citalopram (CELEXA) 20 MG tablet TAKE 1 TABLET BY MOUTH EVERY DAY (Patient taking differently: Take 20 mg by mouth at bedtime.) 90 tablet 3   dicyclomine (BENTYL) 10 MG capsule TAKE 1 CAPSULE (10 MG TOTAL) BY MOUTH EVERY 8 (EIGHT) HOURS AS  NEEDED FOR SPASMS (ABDOMINAL PAIN). 270 capsule 1   Dulaglutide (TRULICITY) 1.5 LT/9.0ZE SOPN INJECT 1.5MG ONCE WEEKLY 18 mL 3   esomeprazole (NEXIUM) 20 MG capsule TAKE 1 CAPSULE BY MOUTH DAILY BEFORE BREAKFAST 90 capsule 3   ferric carboxymaltose (INJECTAFER) 750 MG/15ML SOLN injection Inject 15 mLs (750 mg total) into the vein as directed for 1 dose. Repeat in 7 days 15 mL 1   furosemide (LASIX) 40 MG tablet Take 1 tablet (40 mg total) by mouth daily. 90 tablet 1   gabapentin (NEURONTIN) 600 MG tablet TAKE 1 AND 1/2 TABLETS BY MOUTH TWICE A DAY 270 tablet 3   glucose blood (ACCU-CHEK GUIDE) test strip USE TO CHECK BLOOD GLUCOSE UP TO 6 TIMES DAILY AS DIRECTED 200 strip 1   INFLIXIMAB IV Inject into the vein every 8 (eight) weeks. Remicaid 52m/kg [facility]     Insulin Pen Needle (B-D UF III MINI PEN NEEDLES) 31G X 5 MM MISC USE TO INJECT INSULINS EQUAL TO 6 TIMES DAILY. 600 each 5   insulin regular human CONCENTRATED (HUMULIN R U-500 KWIKPEN) 500 UNIT/ML KwikPen DIAL 110 UNITS AND INJECT UNDER THE SKIN WITH BREAKFAST AND DINNER, AND 50 UNITS IN THE EVENING. 48 mL 0   INVOKANA 100 MG TABS tablet TAKE 1 TABLET BY MOUTH DAILY BEFORE BREAKFAST. 90 tablet 0   lactase (LACTAID) 3000 UNITS tablet Take 3,000 Units by mouth as needed (when eating foods containing dairy).      loperamide (IMODIUM) 2 MG capsule Take 1 capsule (2 mg total) by mouth 2 (two) times daily as needed for  diarrhea or loose stools. 90 capsule 5   meclizine (ANTIVERT) 25 MG tablet Take 1 tablet (25 mg total) by mouth 3 (three) times daily as needed for dizziness or nausea. 90 tablet 0   metoprolol succinate (TOPROL-XL) 100 MG 24 hr tablet TAKE 1 TABLET BY MOUTH 2 TIMES DAILY. TAKE WITH OR IMMEDIATELY FOLLOWING A MEAL 180 tablet 3   Pediatric Multivitamins-Iron (FLINTSTONES COMPLETE) 18 MG CHEW Chew 1 tablet by mouth 2 (two) times daily.     potassium chloride (KLOR-CON M10) 10 MEQ tablet Take 1 tablet (10 mEq total) by mouth daily. 180 tablet 0   rivaroxaban (XARELTO) 10 MG TABS tablet Take 10 mg by mouth daily.     No facility-administered medications prior to visit.    Allergies  Allergen Reactions   Omeprazole Anaphylaxis and Swelling    SWELLING OF TONGUE AND THROAT   Actos [Pioglitazone] Swelling and Other (See Comments)    Weight gain, tongue swelling    Benzocaine-Menthol Swelling    SWELLING OF MOUTH   Colesevelam Other (See Comments)    GI UPSET   Flagyl [Metronidazole Hcl] Other (See Comments)    DIAPHORESIS   Metformin And Related Diarrhea   Shrimp [Shellfish Allergy] Itching    OF THROAT AND EARS, if consumed raw   Statins Palpitations   Allevyn Adhesive [Wound Dressings]     Other reaction(s): Unknown   Desipramine Hcl Itching, Nausea Only and Other (See Comments)    "swimmy" headed, ears itched    Hydromorphone Itching   Jardiance [Empagliflozin] Other (See Comments)    weakness   Lactose Intolerance (Gi) Diarrhea    Gas, bloating   Adhesive [Tape] Other (See Comments)    SKIN IRRITATION AND BRUISING   Nisoldipine Itching   Percocet [Oxycodone-Acetaminophen] Itching    ROS As per HPI  PE:    08/28/2022  9:39 AM 06/24/2022    3:33 PM 05/23/2022    1:19 PM  Vitals with BMI  Height 5' 2" 5' 2" 5' 0"  Weight 225 lbs 3 oz 224 lbs 3 oz 219 lbs 6 oz  BMI 16.10 41 96.04  Systolic 540 981 191  Diastolic 76 54 65  Pulse 97 69 95     Physical Exam  Gen:  Alert, well appearing.  Patient is oriented to person, place, time, and situation. AFFECT: pleasant, lucid thought and speech. Foot exam -->she has some nonpitting edema on dorsum bilat, no tenderness or skin or vascular lesions. Color and temperature is normal. Sensation dec by monofilament plantar surf bilat, Peripheral pulses are not palpable d/t excess firm adipose tissure.. Toenails are hypertrophic.   LABS:  Last CBC Lab Results  Component Value Date   WBC 5.0 05/23/2022   HGB 11.8 (L) 05/23/2022   HCT 35.5 (L) 05/23/2022   MCV 92.1 05/23/2022   MCH 29.3 01/22/2022   RDW 14.4 05/23/2022   PLT 150.0 05/23/2022   Lab Results  Component Value Date   IRON 83 05/23/2022   TIBC 355 05/23/2022   FERRITIN 11 (L) 47/82/9562   Last metabolic panel Lab Results  Component Value Date   GLUCOSE 178 (H) 05/23/2022   NA 137 05/23/2022   K 3.5 05/23/2022   CL 100 05/23/2022   CO2 29 05/23/2022   BUN 13 05/23/2022   CREATININE 0.78 05/23/2022   GFRNONAA >60 01/25/2022   CALCIUM 8.8 05/23/2022   PROT 7.5 05/23/2022   ALBUMIN 3.5 05/23/2022   LABGLOB 3.4 08/17/2015   AGRATIO 1.0 08/17/2015   BILITOT 0.9 05/23/2022   ALKPHOS 87 05/23/2022   AST 27 05/23/2022   ALT 21 05/23/2022   ANIONGAP 6 01/25/2022   Last lipids Lab Results  Component Value Date   CHOL 165 08/21/2021   HDL 34.20 (L) 08/21/2021   LDLCALC 106 (H) 08/21/2021   LDLDIRECT 69.5 01/24/2011   TRIG 122.0 08/21/2021   CHOLHDL 5 08/21/2021   Last hemoglobin A1c Lab Results  Component Value Date   HGBA1C 6.1 (A) 08/28/2022   HGBA1C 6.1 08/28/2022   HGBA1C 6.1 08/28/2022   HGBA1C 6.1 08/28/2022   Last thyroid functions Lab Results  Component Value Date   TSH 1.09 08/18/2020   Last vitamin B12 and Folate Lab Results  Component Value Date   VITAMINB12 >2,000 (H) 01/26/2019   IMPRESSION AND PLAN:  #1 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 Erratic home glucoses, which is typical for  her. POC Hba1c is 6.1% today.  Feet exam with mild diminished monofilament sensation today. Urine microalbumin/creatinine today. No changes in meds recommended. Continue Invokana 130 mg a day, Trulicity 1.5 mg weekly, and U-500 insulin 110 units before every meal.  Avoid use of U-500 at bedtime. Check electrolytes and creatinine today.  #2  iron deficiency anemia due to chronic blood loss--recurrent epistaxis (she is s/p nasal surgery 7 mo ago).   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 Resolved since surgery 7 months ago. She does not tolerate oral iron. She did get an iron infusion since I last saw her in August this year. Check CBC and iron panel today.  3 hyperlipidemia, intolerant of statins. She is fasting so we will monitor lipid panel today.  #4 dysuria and urinary urgency. UTI: UA showed 1+ blood and 3+ gluc, o/w normal.  Her glucosuria certainly could  be contributing to this. Bactrim x3 days for UTI, culture sent.  #5 PAF, rate controlled and historically has been anticoagulated with Xarelto. She has been out of this medication for 6 weeks, she has been trying to rectify this with cardiology.  It appears she has not followed up with them in 6 months so I encouraged her to do so.  In the meantime I sent in a 30-day supply of her Xarelto to get back on this.  An After Visit Summary was printed and given to the patient.  FOLLOW UP: Return in about 3 months (around 11/28/2022) for annual CPE (fasting).  Signed:  Crissie Sickles, MD           08/28/2022

## 2022-08-28 NOTE — Telephone Encounter (Signed)
Tried calling patient. No answer. Vm full

## 2022-08-28 NOTE — Patient Instructions (Signed)
Please contact Dr.Groat's office to schedule your next eye exam, [phone] .

## 2022-08-28 NOTE — Telephone Encounter (Signed)
Unable to reach pt or leave a message mailbox is full

## 2022-08-29 ENCOUNTER — Encounter: Payer: Self-pay | Admitting: Family Medicine

## 2022-08-29 LAB — URINE CULTURE
MICRO NUMBER:: 14246267
Result:: NO GROWTH
SPECIMEN QUALITY:: ADEQUATE

## 2022-08-29 LAB — IRON,TIBC AND FERRITIN PANEL
%SAT: 53 % (calc) — ABNORMAL HIGH (ref 16–45)
Ferritin: 333 ng/mL — ABNORMAL HIGH (ref 16–288)
Iron: 129 ug/dL (ref 45–160)
TIBC: 243 mcg/dL (calc) — ABNORMAL LOW (ref 250–450)

## 2022-08-29 NOTE — Telephone Encounter (Signed)
Tried calling patient. No answer and voice mail is full. Unable to leave message.

## 2022-08-30 ENCOUNTER — Encounter (INDEPENDENT_AMBULATORY_CARE_PROVIDER_SITE_OTHER): Payer: Self-pay | Admitting: *Deleted

## 2022-08-30 NOTE — Telephone Encounter (Signed)
Letter mailed to patient asking for her to call back to office

## 2022-08-30 NOTE — Telephone Encounter (Signed)
Tried calling and no answer. Voicemail full.

## 2022-09-04 ENCOUNTER — Other Ambulatory Visit: Payer: Self-pay

## 2022-09-04 DIAGNOSIS — E1140 Type 2 diabetes mellitus with diabetic neuropathy, unspecified: Secondary | ICD-10-CM

## 2022-09-04 MED ORDER — TRULICITY 1.5 MG/0.5ML ~~LOC~~ SOAJ: SUBCUTANEOUS | 3 refills | Status: DC

## 2022-09-04 MED ORDER — HUMULIN R U-500 KWIKPEN 500 UNIT/ML ~~LOC~~ SOPN: PEN_INJECTOR | SUBCUTANEOUS | 0 refills | Status: DC

## 2022-09-05 ENCOUNTER — Other Ambulatory Visit: Payer: Self-pay | Admitting: Family Medicine

## 2022-09-05 DIAGNOSIS — K5180 Other ulcerative colitis without complications: Secondary | ICD-10-CM | POA: Diagnosis not present

## 2022-09-05 DIAGNOSIS — Z79899 Other long term (current) drug therapy: Secondary | ICD-10-CM | POA: Diagnosis not present

## 2022-09-05 DIAGNOSIS — R5383 Other fatigue: Secondary | ICD-10-CM | POA: Diagnosis not present

## 2022-09-05 DIAGNOSIS — Z111 Encounter for screening for respiratory tuberculosis: Secondary | ICD-10-CM | POA: Diagnosis not present

## 2022-09-11 ENCOUNTER — Telehealth (INDEPENDENT_AMBULATORY_CARE_PROVIDER_SITE_OTHER): Payer: Self-pay | Admitting: *Deleted

## 2022-09-11 NOTE — Telephone Encounter (Signed)
According to PCP note, patient is taking xarelto.

## 2022-09-11 NOTE — Telephone Encounter (Signed)
Patient left voicmail stating she never got a return appt. She was last seen 10/16/21 by dr Laural [name] and he recommended a visit in 6 months. She would like to schedule.   [phone] or [phone]

## 2022-09-12 ENCOUNTER — Encounter (INDEPENDENT_AMBULATORY_CARE_PROVIDER_SITE_OTHER): Payer: Self-pay | Admitting: Gastroenterology

## 2022-09-12 ENCOUNTER — Ambulatory Visit (INDEPENDENT_AMBULATORY_CARE_PROVIDER_SITE_OTHER): Payer: Medicare PPO | Admitting: Gastroenterology

## 2022-09-12 ENCOUNTER — Encounter (INDEPENDENT_AMBULATORY_CARE_PROVIDER_SITE_OTHER): Payer: Self-pay | Admitting: *Deleted

## 2022-09-12 VITALS — BP 133/70 | HR 74 | Temp 97.8°F | Ht 62.0 in | Wt 227.5 lb

## 2022-09-12 DIAGNOSIS — K5100 Ulcerative (chronic) pancolitis without complications: Secondary | ICD-10-CM

## 2022-09-12 DIAGNOSIS — K7460 Unspecified cirrhosis of liver: Secondary | ICD-10-CM

## 2022-09-12 NOTE — Progress Notes (Signed)
[name], M.D. Gastroenterology & Hepatology [facility] [address] [place], [place] [address]  Primary Care Physician: Tammi Sou, MD [address] [place] [address]  I will communicate my assessment and recommendations to the referring MD via EMR.  Problems: Ulcerative colitis on infliximab NASH cirrhosis  History of Present Illness: [name] is a 76 y.o. female with past medical history of ulcerative colitis on infliximab, NASH cirrhosis, followed by GI, GERD, anxiety, hyperlipidemia, history of iron deficiency anemia, diabetes, neuropathy, paroxysmal atrial fibrillation, OSA, IBS, fibromyalgia, who presents for follow up of ulcerative colitis and cirrhosis.  The patient was last seen on 10/16/2021. At that time, the patient was advised to continue on Infliximab every 8 weeks.  Patient reports she has had chronic issues with urgency, which actually got worse for the last 6 months. She noticed having very severe urgency recently with frequent accidents through the day and even overnight. She states that she has had to take Imodium when going out of her home. She reports that she can have significant flatulence. States that if she tries to take imodium daily, she can get really bad constipation and may not have a BM for a week. She is now taking Imodium every other day most of the time. No melena or hematochezia. She is having 4-5 watery bowel movements per day.  Rarely sees fresh blood in her stool but this can be present when she has had multiple bowel movements per day.The patient denies having any nausea, vomiting, fever, chills, hematochezia, melena, hematemesis, abdominal distention, abdominal pain, jaundice, pruritus or weight loss.  Last fecal calprotectin on 09/2021 was 132.  Last Remicade dose was on 09/05/2022.   I received the most recent blood workup performed on 09/05/2022 which showed a white blood cell  count of 3.8, hemoglobin 14.0, platelets 110, CMP with glucose 222, sodium 146, potassium 4.1, AST 92, ALT 50, total bilirubin 0.7, alkaline phosphatase 130, albumin 3.6, negative QuantiFERON and acute hepatitis panel  Cirrhosis related questions: Hematemesis/coffee ground emesis: No History of variceal bleeding: No Abdominal pain: No Abdominal distention/worsening ascites: some distetnion but no ascites Fever/chills: No Episodes of confusion/disorientation: No Taking diuretics?: No Prior history of banding?: No Prior episodes of SBP: No Last time liver imaging was performed:11/23/21 US Doppler, had a 1.4 cm left liver mass.  Underwent subsequent MRI of the liver on 01/03/2022 which did not show any masses. MELD score: 04/2022 - 16  Last EGD: 10/31/2021 - Normal hypopharynx. - Normal esophagus. - Z-line regular, 38 cm from the incisors. - Portal hypertensive gastropathy. - Normal duodenal bulb and second portion of the duodenum.  Repeat EGD in 1 year  Last Colonoscopy: 09/15/2020 - Perianal skin tags found on perianal exam. - The examined portion of the ileum was normal. - Inactive (Mayo Score 0) ulcerative colitis, improved since the last examination. Biopsied. - Diverticulosis in the sigmoid colon.  FINAL MICROSCOPIC DIAGNOSIS:  A. COLON, CECUM. BIOPSY: - Benign colonic mucosa. - No active inflammation. - No dysplasia or malignancy.  B. COLON, 80 CM, BIOPSY: - Benign colonic mucosa. - No active inflammation. - No dysplasia or malignancy.  C. COLON, 70 CM, BIOPSY: - Benign colonic mucosa. - No active inflammation. - No dysplasia or malignancy.  D. COLON, 60 CM, BIOPSY: - Benign colonic mucosa. - No active inflammation. - No dysplasia or malignancy.  E. COLON, 50 CM, BIOPSY: - Benign colonic mucosa. - No active inflammation. - No  dysplasia or malignancy.  F. COLON, 40 CM, BIOPSY: - Benign colonic mucosa. - No active inflammation. - No dysplasia or  malignancy.  G. COLON, 30 CM, BIOPSY: - Benign colonic mucosa. - No active inflammation. - No dysplasia or malignancy.  H. COLON, 20 CM, BIOPSY: - Chronic moderately active colitis. - No dysplasia or malignancy.  I. COLON, 10 CM, BIOPSY: - Benign colonic mucosa. - No active inflammation. - No dysplasia or malignancy.   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 ? to get liver bx if she gets bariatric surgery? Mild portal hypertensive gastropathy on EGD 08/2019.   Dysrhythmia    Fibromyalgia    GAD (generalized anxiety disorder)    GERD    History of hiatal hernia    History of iron deficiency anemia 12/2018   Inadequate absorption secondary to chronic/long term PPI therapy + portal hypertensive gastroduodenopathy. No GIB found on EGD, colonosc, and givens. Iron infusions X multiple.   History of thrombocytopenia 12/2011   Hyperlipidemia    Intolerant of statins   HYPERTENSION    IBS (irritable bowel syndrome)    -D.  Good response to bentyl and imodium as of 06/2018 GI f/u.   IDDM (insulin dependent diabetes mellitus)    with DPN (managed by Dr. Cruzita Lederer but then in 2018 pt preferred to have me manage for her convenience)   Limited mobility    Requires a walker for arthritic pain, widespread musculoskeletal pain, and neuropathic pain.   Morbid obesity ([place])    As of 11/2018, pt considering sleeve gastectomy vs bipass as of eval by Dr. De Burrs considering as of 12/2019.   Nonalcoholic steatohepatitis    Viral Hep screens NEG.  CT 2015.  Transaminasemia.  U/S 07/2018 showed early changes of cirrhosis.   OSA (obstructive sleep apnea) 09/14/2015   sleep study 09/07/15: severe obstructive sleep apnea with an AHI of 72 and SaO2 low of  75%.>referred to sleep MD   Osteoarthritis    hips, shoulders, knees   PAF (paroxysmal atrial fibrillation) ([place])    One documented episode (after getting EGD 2016).  Was on amiodarone x 3 mo.  Rate control with metoprolol + anticoag with xarelto.    Recurrent epistaxis    Granuloma in L nare cauterized by ENT 04/2020. Another cautery 06/2020   Small fiber neuropathy    Due to DM.  Symmetric hands and feet tingling/numbness.   Ulcerative colitis ([place])    Remicade infusion Q 8 weeks: in clinical and endoscopic remission as of 12/2018 GI f/u.  06/11/19 rpt colonoscopy->cecal and ascending colon colitis.    Past Surgical History: Past Surgical History:  Procedure Laterality Date   ABDOMINAL HYSTERECTOMY  1980   Paps no longer indicated.   BACK SURGERY     BACTERIAL OVERGROWTH TEST N/A 07/13/2015   Procedure: BACTERIAL OVERGROWTH TEST;  Surgeon: Rogene Houston, MD;  [facility];  Service: Endoscopy;  Laterality: N/A;  730     BILATERAL SALPINGOOPHORECTOMY  02/10/2001   BIOPSY  06/11/2019   Procedure: BIOPSY;  Surgeon: Rogene Houston, MD;  [facility];  Service: Endoscopy;;  colon   BIOPSY  09/27/2019   Procedure: BIOPSY;  Surgeon: Rogene Houston, MD;  [facility];  Service: Endoscopy;;  gastric duodenal   BIOPSY  09/15/2020   Procedure: BIOPSY;  Surgeon: Harvel Quale, MD;  [facility];  Service: Gastroenterology;;   BREAST REDUCTION SURGERY  1994   bilat   BREAST SURGERY     CARDIOVASCULAR STRESS TEST  07/2010   Lexiscan myoview: normal   CARPAL TUNNEL RELEASE Right 1996   CARPAL TUNNEL RELEASE Left 03/21/2003   CARPAL TUNNEL RELEASE Right 05/04/2013   Procedure: CARPAL TUNNEL RELEASE;  Surgeon: Cammie Sickle., MD;  Location: [place];  Service: Orthopedics;  Laterality: Right;   CARPAL TUNNEL RELEASE Left 09/21/2013   Procedure: LEFT CARPAL TUNNEL RELEASE;  Surgeon: Cammie Sickle., MD;   Location: [place];  Service: Orthopedics;  Laterality: Left;   CHOLECYSTECTOMY     COLONOSCOPY WITH PROPOFOL N/A 08/04/2015   Colitis in remission.  No polyps.  Procedure: COLONOSCOPY WITH PROPOFOL;  Surgeon: Rogene Houston, MD;  [facility];  Service: Endoscopy;  Laterality: N/A;  cecum time in  0820   time out  0827    total time 7 minutes   COLONOSCOPY WITH PROPOFOL N/A 06/11/2019   cecal and ascending colon colitis.  Procedure: COLONOSCOPY WITH PROPOFOL;  Surgeon: Rogene Houston, MD;  [facility];  Service: Endoscopy;  Laterality: N/A;  730a   COLONOSCOPY WITH PROPOFOL N/A 09/15/2020   Procedure: COLONOSCOPY WITH PROPOFOL;  Surgeon: Harvel Quale, MD;  [facility];  Service: Gastroenterology;  Laterality: N/A;  1030   ESOPHAGEAL DILATION N/A 08/04/2015   Procedure: ESOPHAGEAL DILATION;  Surgeon: Rogene Houston, MD;  [facility];  Service: Endoscopy;  Laterality: N/AKelvin Cellar, no mucousal disruption   ESOPHAGOGASTRODUODENOSCOPY  09/27/2019   Performed for IDA.  Esoph dilation was done but no stricture present.  Mild portal hypertensive gastropathy, o/w normal.  Duodenal bx NEG.  h pylori neg.   ESOPHAGOGASTRODUODENOSCOPY (EGD) WITH ESOPHAGEAL DILATION  12/02/2005   ESOPHAGOGASTRODUODENOSCOPY (EGD) WITH PROPOFOL N/A 08/04/2015   Procedure: ESOPHAGOGASTRODUODENOSCOPY (EGD) WITH PROPOFOL;  Surgeon: Rogene Houston, MD;  [facility];  Service: Endoscopy;  Laterality: N/A;  procedure 1   ESOPHAGOGASTRODUODENOSCOPY (EGD) WITH PROPOFOL N/A 09/27/2019   Procedure: ESOPHAGOGASTRODUODENOSCOPY (EGD) WITH PROPOFOL;  Surgeon: Rogene Houston, MD;  [facility];  Service: Endoscopy;  Laterality: N/A;  12:10   ESOPHAGOGASTRODUODENOSCOPY (EGD) WITH PROPOFOL N/A 10/31/2021   Procedure: ESOPHAGOGASTRODUODENOSCOPY (EGD) WITH PROPOFOL;  Surgeon: Rogene Houston, MD;  [facility];  Service: Endoscopy;  Laterality:  N/A;  [place]  01/17/2012   Procedure: FLEXIBLE SIGMOIDOSCOPY;  Surgeon: Rogene Houston, MD;  [facility];  Service: Endoscopy;  Laterality: N/A;   GIVENS CAPSULE STUDY N/A 08/03/2019   Procedure: GIVENS CAPSULE STUDY (performed for IDA)->some food debris in stomach and small amount of blood.  Surgeon: Rogene Houston, MD;  [facility];  Service: Endoscopy;  Laterality: N/A;  730AM   HEMILAMINOTOMY LUMBAR SPINE Bilateral 09/07/1999   L4-5   KNEE ARTHROSCOPY Right 01/1999; 10/2000   LYSIS OF ADHESION  02/10/2001   MALONEY DILATION  09/27/2019   Procedure: Venia Minks DILATION;  Surgeon: Rogene Houston, MD;  [facility];  Service: Endoscopy;;   NASAL ENDOSCOPY WITH EPISTAXIS CONTROL Bilateral 01/25/2022   Procedure: NASAL ENDOSCOPY WITH EPISTAXIS CONTROL;  Surgeon: Jerrell Belfast, MD;  Location: [place] [place];  Service: ENT;  Laterality: Bilateral;   NASAL SEPTOPLASTY W/  TURBINOPLASTY Bilateral 01/25/2022   Procedure: NASAL SEPTOPLASTY WITH TURBINATE REDUCTION;  Surgeon: Jerrell Belfast, MD;  Location: [place];  Service: ENT;  Laterality: Bilateral;   [place]; 09/12/2006   TARSAL TUNNEL RELEASE  2002   TRANSTHORACIC ECHOCARDIOGRAM  08/04/2015   EF 60-65%, normal wall motion, mild LVH, mild LA dilation, grd I DD.   TUMOR EXCISION Left 03/21/2003   dorsal 1st web space (hand)   URETEROLYSIS Right 02/10/2001    Family History: Family History  Problem Relation Age of Onset   Diabetes Mother    Hypertension Mother    Heart attack Father        Mid 90's   Heart disease Father    Lung disease Father        spot on lung; had lung surgery   Alcohol abuse Other    Hypertension Son    Diabetes Son    Emphysema Maternal Grandfather    Asthma Maternal Grandfather    Colon cancer Paternal Grandfather    Stomach cancer Paternal Grandmother    Neuropathy Neg Hx     Social History: Social History   Tobacco Use  Smoking Status  Never  Smokeless Tobacco Never   Social History   Substance and Sexual Activity  Alcohol Use Not Currently   Comment: ocassionally   Social History   Substance and Sexual Activity  Drug Use No    Allergies: Allergies  Allergen Reactions   Omeprazole Anaphylaxis and Swelling    SWELLING OF TONGUE AND THROAT   Actos [Pioglitazone] Swelling and Other (See Comments)    Weight gain, tongue swelling    Benzocaine-Menthol Swelling    SWELLING OF MOUTH   Colesevelam Other (See Comments)    GI UPSET   Flagyl [Metronidazole Hcl] Other (See Comments)    DIAPHORESIS   Metformin And Related Diarrhea   Shrimp [Shellfish Allergy] Itching    OF THROAT AND EARS, if consumed raw   Statins Palpitations   Allevyn Adhesive [Wound Dressings]     Other reaction(s): Unknown   Desipramine Hcl Itching, Nausea Only and Other (See Comments)    "swimmy" headed, ears itched    Hydromorphone Itching   Jardiance [Empagliflozin] Other (See Comments)    weakness   Lactose Intolerance (Gi) Diarrhea    Gas, bloating   Adhesive [Tape] Other (See Comments)    SKIN IRRITATION AND BRUISING   Nisoldipine Itching   Percocet [Oxycodone-Acetaminophen] Itching    Medications: Current Outpatient Medications  Medication Sig Dispense Refill   Accu-Chek Softclix Lancets lancets USE TO CHECK BLOOD GLUCOSE UP TO 6 TIMES DAILY AS DIRECTED 200 each 1   acetaminophen (TYLENOL) 325 MG tablet Take 650 mg by mouth every 6 (six) hours as needed for moderate pain or headache.     amitriptyline (ELAVIL) 25 MG tablet Take 25 mg by mouth in the morning and at bedtime.     blood glucose meter kit and supplies KIT Use up to six times daily as directed. DX. E11.9 1 each 0   citalopram (CELEXA) 20 MG tablet TAKE 1 TABLET BY MOUTH EVERY DAY (Patient taking differently: Take 20 mg by mouth at bedtime.) 90 tablet 3   dicyclomine (BENTYL) 10 MG capsule TAKE 1 CAPSULE (10 MG TOTAL) BY MOUTH EVERY 8 (EIGHT) HOURS AS NEEDED FOR  SPASMS (ABDOMINAL PAIN). 270 capsule 1   Dulaglutide (TRULICITY) 1.5 CH/8.5ID SOPN INJECT 1.5MG ONCE WEEKLY 18 mL 3   esomeprazole (NEXIUM) 20 MG capsule TAKE 1 CAPSULE  BY MOUTH DAILY BEFORE BREAKFAST 90 capsule 3   ferric carboxymaltose (INJECTAFER) 750 MG/15ML SOLN injection Inject 15 mLs (750 mg total) into the vein as directed for 1 dose. Repeat in 7 days 15 mL 1   furosemide (LASIX) 40 MG tablet Take 1 tablet (40 mg total) by mouth daily. 90 tablet 1   gabapentin (NEURONTIN) 600 MG tablet TAKE 1 AND 1/2 TABLETS BY MOUTH TWICE A DAY 270 tablet 3   glucose blood (ACCU-CHEK GUIDE) test strip USE TO CHECK BLOOD GLUCOSE UP TO 6 TIMES DAILY AS DIRECTED 200 strip 1   INFLIXIMAB IV Inject into the vein every 8 (eight) weeks. Remicaid 57m/kg In[facility] 8 weeks [facility]     Insulin Pen Needle (B-D UF III MINI PEN NEEDLES) 31G X 5 MM MISC USE TO INJECT INSULINS EQUAL TO 6 TIMES DAILY. 600 each 5   insulin regular human CONCENTRATED (HUMULIN R U-500 KWIKPEN) 500 UNIT/ML KwikPen DIAL 110 UNITS AND INJECT UNDER THE SKIN WITH BREAKFAST AND DINNER, AND 50 UNITS IN THE EVENING. 48 mL 0   INVOKANA 100 MG TABS tablet TAKE 1 TABLET BY MOUTH EVERY DAY BEFORE BREAKFAST 90 tablet 0   lactase (LACTAID) 3000 UNITS tablet Take 3,000 Units by mouth as needed (when eating foods containing dairy).      loperamide (IMODIUM) 2 MG capsule Take 1 capsule (2 mg total) by mouth 2 (two) times daily as needed for diarrhea or loose stools. 90 capsule 5   meclizine (ANTIVERT) 25 MG tablet Take 1 tablet (25 mg total) by mouth 3 (three) times daily as needed for dizziness or nausea. 90 tablet 0   metoprolol succinate (TOPROL-XL) 100 MG 24 hr tablet TAKE 1 TABLET BY MOUTH 2 TIMES DAILY. TAKE WITH OR IMMEDIATELY FOLLOWING A MEAL 180 tablet 3   Pediatric Multivitamins-Iron (FLINTSTONES COMPLETE) 18 MG CHEW Chew 1 tablet by mouth 2 (two) times daily.     potassium chloride (KLOR-CON M10) 10 MEQ tablet Take 1 tablet  (10 mEq total) by mouth daily. 180 tablet 0   rivaroxaban (XARELTO) 10 MG TABS tablet Take 1 tablet (10 mg total) by mouth daily. 30 tablet 0   sulfamethoxazole-trimethoprim (BACTRIM DS) 800-160 MG tablet Take 1 tablet by mouth 2 (two) times daily. 6 tablet 0   No current facility-administered medications for this visit.    Review of Systems: GENERAL: negative for malaise, night sweats HEENT: No changes in hearing or vision, no nose bleeds or other nasal problems. NECK: Negative for lumps, goiter, pain and significant neck swelling RESPIRATORY: Negative for cough, wheezing CARDIOVASCULAR: Negative for chest pain, leg swelling, palpitations, orthopnea GI: SEE HPI MUSCULOSKELETAL: Negative for joint pain or swelling, back pain, and muscle pain. SKIN: Negative for lesions, rash PSYCH: Negative for sleep disturbance, mood disorder and recent psychosocial stressors. HEMATOLOGY Negative for prolonged bleeding, bruising easily, and swollen nodes. ENDOCRINE: Negative for cold or heat intolerance, polyuria, polydipsia and goiter. NEURO: negative for tremor, gait imbalance, syncope and seizures. The remainder of the review of systems is noncontributory.   Physical Exam: BP 133/70 (BP Location: Left Arm, Patient Position: Sitting, Cuff Size: Large)   Pulse 74   Temp 97.8 F (36.6 C) (Temporal)   Ht _0  (1.575 m)   Wt 227 lb 8 oz (103.2 kg)   BMI 41.61 kg/m  GENERAL: The patient is AO x3, in no acute distress. HEENT: Head is normocephalic and atraumatic. EOMI are intact. Mouth is well hydrated and without lesions. NECK: Supple.  No masses LUNGS: Clear to auscultation. No presence of rhonchi/wheezing/rales. Adequate chest expansion HEART: RRR, normal s1 and s2. ABDOMEN: Soft, nontender, no guarding, no peritoneal signs, and nondistended. BS +. No masses. EXTREMITIES: Without any cyanosis, clubbing, rash, lesions or edema. NEUROLOGIC: AOx3, no focal motor deficit. SKIN: no jaundice, no  rashes  Imaging/Labs: as above  I personally reviewed and interpreted the available labs, imaging and endoscopic files.  Impression and Plan: [name] is a 76 y.o. female with past medical history of ulcerative colitis on infliximab, NASH cirrhosis, followed by GI, GERD, anxiety, hyperlipidemia, history of iron deficiency anemia, diabetes, neuropathy, paroxysmal atrial fibrillation, OSA, IBS, fibromyalgia, who presents for follow up of ulcerative colitis and cirrhosis.  Patient has presented evidence of recurrent urgency and fecal soiling.  She has been taking Remicade compliantly every 8 weeks.  Was found to have substantial improvement in her endoscopic findings in hemoglobin and colonoscopy but had an area of congestion in the sigmoid colon.  It is possible she has not presented complete remission of her disease as her most recent fecal calprotectin.  Due to this, we will check Remicade levels for her next dose, but we will also rule out gastrointestinal infections today and check repeat fecal calprotectin.  She was advised to continue with Imodium as needed for now, but can take Benefiber on a daily basis to increase the stool bulk.  Regarding her cirrhosis, she has not presented any decompensating event of although she may have evidence of portal hypertension as she has mildly decreased platelet count.  Will check a liver ultrasound for [place] screening and she was asked to have repeat MELD labs and AFP in her next blood draw.  No signs of HE or ascites at the moment.  Will discuss repeat esophagogastroduodenospy next appointment as she was going to have a repeat EGD 1 year from her last.  - Continue Remicade every 8 weeks - Check fecal calprotectin, c. Diff and GI pathogen panel - Check infliximab levels, CRP, MELD labs and AFP the day before your next infliximab dose - Schedule liver US - Start Benefiber fiber supplements daily to increase stool bulk - Can continue Imodium as needed for  now - Repeat EGD will be scheduled in next appointment - Reduce salt intake to <2 g per day - Can take Tylenol max of 2 g per day (650 mg q8h) for pain - Avoid NSAIDs for pain - Avoid eating raw oysters/shellfish - Protein shake (Ensure or Boost) every night before going to sleep  All questions were answered.      [name], MD Gastroenterology and Hepatology [facility]

## 2022-09-12 NOTE — Patient Instructions (Addendum)
-   Continue Remicade every 8 weeks - Check fecal calprotectin, c. Diff and GI pathogen panel - Check infliximab levels, CRP, MELD labs and AFP the day before your next infliximab dose - Schedule liver US - Start Benefiber fiber supplements daily to increase stool bulk - Can continue Imodium as needed for now - Repeat EGD will be scheduled in next appointment - Reduce salt intake to <2 g per day - Can take Tylenol max of 2 g per day (650 mg q8h) for pain - Avoid NSAIDs for pain - Avoid eating raw oysters/shellfish - Protein shake (Ensure or Boost) every night before going to sleep

## 2022-09-20 ENCOUNTER — Other Ambulatory Visit (INDEPENDENT_AMBULATORY_CARE_PROVIDER_SITE_OTHER): Payer: Self-pay | Admitting: Internal Medicine

## 2022-09-27 ENCOUNTER — Encounter (HOSPITAL_COMMUNITY): Payer: Self-pay

## 2022-09-27 ENCOUNTER — Emergency Department (HOSPITAL_COMMUNITY)
Admission: EM | Admit: 2022-09-27 | Discharge: 2022-09-27 | Disposition: A | Discharge status: Home / Self Care | Payer: Medicare PPO | Attending: Emergency Medicine | Admitting: Emergency Medicine

## 2022-09-27 DIAGNOSIS — M5441 Lumbago with sciatica, right side: Secondary | ICD-10-CM | POA: Insufficient documentation

## 2022-09-27 DIAGNOSIS — M5442 Lumbago with sciatica, left side: Secondary | ICD-10-CM | POA: Diagnosis not present

## 2022-09-27 DIAGNOSIS — M545 Low back pain, unspecified: Secondary | ICD-10-CM | POA: Diagnosis present

## 2022-09-27 MED ORDER — KETOROLAC TROMETHAMINE 30 MG/ML IJ SOLN
45.0000 mg | Freq: Once | INTRAMUSCULAR | Status: AC
  Administered 2022-09-27: 45 mg via INTRAMUSCULAR
  Filled 2022-09-27: qty 2

## 2022-09-27 MED ORDER — METHOCARBAMOL 500 MG PO TABS
500.0000 mg | ORAL_TABLET | Freq: Once | ORAL | Status: AC
  Administered 2022-09-27: 500 mg via ORAL
  Filled 2022-09-27: qty 1

## 2022-09-27 MED ORDER — MORPHINE SULFATE (PF) 4 MG/ML IV SOLN
4.0000 mg | Freq: Once | INTRAVENOUS | Status: AC
  Administered 2022-09-27: 4 mg via INTRAMUSCULAR
  Filled 2022-09-27: qty 1

## 2022-09-27 MED ORDER — METHOCARBAMOL 500 MG PO TABS: 500.0000 mg | ORAL_TABLET | Freq: Three times a day (TID) | ORAL | 0 refills | Status: DC

## 2022-09-27 MED ORDER — HYDROCODONE-ACETAMINOPHEN 5-325 MG PO TABS: ORAL_TABLET | ORAL | 0 refills | Status: DC

## 2022-09-27 NOTE — ED Provider Notes (Signed)
[facility] EMERGENCY DEPARTMENT Provider Note   CSN: [id number] Arrival date & time: 09/27/22  1208     History  Chief Complaint  Patient presents with   Back Pain    [name] is a 76 y.o. female.   Back Pain Associated symptoms: no abdominal pain, no chest pain, no dysuria, no fever, no numbness, no pelvic pain and no weakness        [name] is a 76 y.o. female past medical history of hypertension, paroxysmal atrial fibs, sleep apnea, type 2 diabetes with peripheral neuropathy, fibromyalgia who presents to the Emergency Department complaining of low back pain today.  She states that she was mopping and sweeping the floors 2 days ago.  Pain began the following day, gradually worsening.  Pain is worsening with standing and walking, improves with rest.  She describes aching pain across her lower back that radiates into her buttocks and down the backside of both legs.  Left is worse than right.  States that she feels as though she is having "muscle spasms" of her lower back.  She denies any abdominal pain, fever, chills, numbness or weakness of her lower extremities, urine or bowel changes.  She denies fall.  Home Medications Prior to Admission medications   Medication Sig Start Date End Date Taking? Authorizing Provider  Accu-Chek Softclix Lancets lancets USE TO CHECK BLOOD GLUCOSE UP TO 6 TIMES DAILY AS DIRECTED 05/23/22   McGowen, Adrian Blackwater, MD  acetaminophen (TYLENOL) 325 MG tablet Take 650 mg by mouth every 6 (six) hours as needed for moderate pain or headache.    [provider]  amitriptyline (ELAVIL) 25 MG tablet Take 25 mg by mouth in the morning and at bedtime.    [provider]  blood glucose meter kit and supplies KIT Use up to six times daily as directed. DX. E11.9 01/05/20   McGowen, Adrian Blackwater, MD  citalopram (CELEXA) 20 MG tablet TAKE 1 TABLET BY MOUTH EVERY DAY Patient taking differently: Take 20 mg by mouth at bedtime. 09/25/21   McGowen,  Adrian Blackwater, MD  dicyclomine (BENTYL) 10 MG capsule TAKE 1 CAPSULE (10 MG TOTAL) BY MOUTH EVERY 8 (EIGHT) HOURS AS NEEDED FOR SPASMS (ABDOMINAL PAIN). 09/20/22   Harvel Quale, MD  Dulaglutide (TRULICITY) 1.5 ZY/2.4MG SOPN INJECT 1.5MG ONCE WEEKLY 09/04/22   McGowen, Adrian Blackwater, MD  esomeprazole (NEXIUM) 20 MG capsule TAKE 1 CAPSULE BY MOUTH DAILY BEFORE BREAKFAST 12/04/21   Rehman, Mechele Dawley, MD  ferric carboxymaltose (INJECTAFER) 750 MG/15ML SOLN injection Inject 15 mLs (750 mg total) into the vein as directed for 1 dose. Repeat in 7 days 02/27/21   McGowen, Adrian Blackwater, MD  furosemide (LASIX) 40 MG tablet Take 1 tablet (40 mg total) by mouth daily. 02/20/22   McGowen, Adrian Blackwater, MD  gabapentin (NEURONTIN) 600 MG tablet TAKE 1 AND 1/2 TABLETS BY MOUTH TWICE A DAY 09/25/21   McGowen, Adrian Blackwater, MD  glucose blood (ACCU-CHEK GUIDE) test strip USE TO CHECK BLOOD GLUCOSE UP TO 6 TIMES DAILY AS DIRECTED 02/20/22   McGowen, Adrian Blackwater, MD  INFLIXIMAB IV Inject into the vein every 8 (eight) weeks. Remicaid 55m[facility]    [provider]  Insulin Pen Needle (B-D UF III MINI PEN NEEDLES) 31G X 5 MM MISC USE TO INJECT INSULINS EQUAL TO 6 TIMES DAILY. 02/20/22   McGowen, PAdrian Blackwater MD  insulin regular human CONCENTRATED (HUMULIN R U-500 KWIKPEN) 500  UNIT/ML KwikPen DIAL 110 UNITS AND INJECT UNDER THE SKIN WITH BREAKFAST AND DINNER, AND 50 UNITS IN THE EVENING. 09/04/22   McGowen, Adrian Blackwater, MD  INVOKANA 100 MG TABS tablet TAKE 1 TABLET BY MOUTH EVERY DAY BEFORE BREAKFAST 09/05/22   McGowen, Adrian Blackwater, MD  lactase (LACTAID) 3000 UNITS tablet Take 3,000 Units by mouth as needed (when eating foods containing dairy).     [provider]  loperamide (IMODIUM) 2 MG capsule Take 1 capsule (2 mg total) by mouth 2 (two) times daily as needed for diarrhea or loose stools. 04/10/21   Rogene Houston, MD  meclizine (ANTIVERT) 25 MG tablet Take 1 tablet (25 mg total) by mouth  3 (three) times daily as needed for dizziness or nausea. 12/07/15   Binnie Rail, MD  metoprolol succinate (TOPROL-XL) 100 MG 24 hr tablet TAKE 1 TABLET BY MOUTH 2 TIMES DAILY. TAKE WITH OR IMMEDIATELY FOLLOWING A MEAL 09/25/21   McGowen, Adrian Blackwater, MD  Pediatric Multivitamins-Iron [facility] COMPLETE) 18 MG CHEW Chew 1 tablet by mouth 2 (two) times daily. Patient not taking: Reported on 09/12/2022 04/10/21   Rogene Houston, MD  potassium chloride (KLOR-CON M10) 10 MEQ tablet Take 1 tablet (10 mEq total) by mouth daily. 02/20/22   McGowen, Adrian Blackwater, MD  rivaroxaban (XARELTO) 10 MG TABS tablet Take 1 tablet (10 mg total) by mouth daily. 08/28/22   McGowen, Adrian Blackwater, MD  bromocriptine (PARLODEL) 2.5 MG tablet Take 2.5 mg by mouth 2 (two) times daily.  12/05/11  [provider]  potassium chloride (K-DUR) 10 MEQ tablet Take 2 tablets (20 mEq total) by mouth daily. 02/17/13 08/18/20  Rowe Clack, MD      Allergies    Omeprazole, Actos [pioglitazone], Benzocaine-menthol, Colesevelam, Flagyl [metronidazole hcl], Metformin and related, Shrimp [shellfish allergy], Statins, Allevyn adhesive [wound dressings], Desipramine hcl, Hydromorphone, Jardiance [empagliflozin], Lactose intolerance (gi), Adhesive [tape], Nisoldipine, and Percocet [oxycodone-acetaminophen]    Review of Systems   Review of Systems  Constitutional:  Negative for chills and fever.  Respiratory:  Negative for shortness of breath.   Cardiovascular:  Negative for chest pain.  Gastrointestinal:  Negative for abdominal pain, diarrhea, nausea and vomiting.  Genitourinary:  Negative for difficulty urinating, dysuria, flank pain, pelvic pain and vaginal pain.  Musculoskeletal:  Positive for back pain. Negative for neck pain.  Neurological:  Negative for dizziness, weakness and numbness.    Physical Exam Updated Vital Signs BP (!) 149/71 (BP Location: Left Arm)   Pulse (!) 58   Temp 98.3 F (36.8 C) (Temporal)   Resp 18    Ht _0  (1.575 m)   Wt 103 kg   SpO2 100%   BMI 41.53 kg/m  Physical Exam Vitals and nursing note reviewed.  Constitutional:      General: She is not in acute distress.    Appearance: Normal appearance. She is not ill-appearing or toxic-appearing.  Cardiovascular:     Rate and Rhythm: Normal rate and regular rhythm.     Pulses: Normal pulses.  Pulmonary:     Effort: Pulmonary effort is normal. No respiratory distress.  Chest:     Chest wall: No tenderness.  Abdominal:     Palpations: Abdomen is soft.     Tenderness: There is no abdominal tenderness.  Musculoskeletal:        General: Tenderness present.     Lumbar back: Spasms and tenderness present. No swelling or deformity. Normal range of motion. Positive right straight leg  raise test and positive left straight leg raise test.     Right lower leg: No edema.     Left lower leg: No edema.     Comments: Diffuse tenderness to palpation of the lower lumbar paraspinal muscles laterally.  Positive straight leg raise bilaterally, worse on the left at 15 degrees.  Skin:    General: Skin is warm.     Capillary Refill: Capillary refill takes less than 2 seconds.     Findings: No erythema or rash.  Neurological:     General: No focal deficit present.     Mental Status: She is alert.     Sensory: Sensation is intact. No sensory deficit.     Motor: Motor function is intact. No weakness.     Coordination: Coordination is intact.     ED Results / Procedures / Treatments   Labs (all labs ordered are listed, but only abnormal results are displayed) Labs Reviewed - No data to display  EKG None  Radiology No results found.  Procedures Procedures    Medications Ordered in ED Medications  ketorolac (TORADOL) 30 MG/ML injection 45 mg (45 mg Intramuscular Given 09/27/22 1428)  methocarbamol (ROBAXIN) tablet 500 mg (500 mg Oral Given 09/27/22 1427)    ED Course/ Medical Decision Making/ A&P                           Medical  Decision Making Patient here for low back pain, pain began after mopping and sweeping 2 days ago.  Pain worse this morning.  Aggravated by standing or walking.  Pain improves with rest.  She denies any numbness or weakness of her lower extremities, saddle anesthesias, recent fever or chills abdominal pain, urine or bowel changes.  On my exam, patient well-appearing mild tenderness palpation of the bilateral lower lumbar paraspinal muscles.  Hip flexors and extensors are intact.  Abdomen is benign.  Differential would include but not limited to musculoskeletal injury, cauda equina.  Suspect this is musculoskeletal as symptoms improve with rest and worsened with ambulation and standing.  No recent procedures to suggest spinal abscess.  Vitals reviewed, no indication for labs at this time.  No history of trauma to indicate need for imaging at this time.  Amount and/or Complexity of Data Reviewed Discussion of management or test interpretation with external provider(s): Patient here with low back pain likely secondary to musculoskeletal injury.  No clinical findings to suggest cauda equina. She has been given injection of pain medication, muscle relaxer and anti-inflammatory.  On recheck, she is resting comfortably.  States pain is improved.  She is ambulated in the department and her gait is steady.  No focal neurodeficits on my exam.  States she is ready for discharge home.  Agreeable to symptomatic treatment and close outpatient follow-up.  Return precautions were discussed.  Risk Prescription drug management.           Final Clinical Impression(s) / ED Diagnoses Final diagnoses:  Acute bilateral low back pain with bilateral sciatica    Rx / DC Orders ED Discharge Orders     None         Kem Parkinson, PA-C 09/27/22 1614    Cristie Hem, MD 09/27/22 [phone]

## 2022-09-27 NOTE — ED Triage Notes (Signed)
Pt reports her middle and lower back began to hurt after sweeping and mopping the floor 2 days ago.  Reports pain now radiates into both legs

## 2022-09-27 NOTE — ED Notes (Signed)
Pt brought back to room in wheelchair

## 2022-09-27 NOTE — Discharge Instructions (Signed)
You can apply warm heat on and off to your back.  Avoid bending or twisting movements for at least 1 week.  Take the medication as directed.  The muscle relaxer and pain medication may cause drowsiness.  Please follow-up with your primary care provider for recheck return to the emergency department for any new or worsening symptoms.

## 2022-09-30 ENCOUNTER — Emergency Department (HOSPITAL_COMMUNITY)
Admission: EM | Admit: 2022-09-30 | Discharge: 2022-09-30 | Disposition: A | Discharge status: Home / Self Care | Payer: Medicare PPO | Attending: Emergency Medicine | Admitting: Emergency Medicine

## 2022-09-30 ENCOUNTER — Other Ambulatory Visit: Payer: Self-pay

## 2022-09-30 ENCOUNTER — Encounter (HOSPITAL_COMMUNITY): Payer: Self-pay | Admitting: Emergency Medicine

## 2022-09-30 ENCOUNTER — Emergency Department (HOSPITAL_COMMUNITY): Payer: Medicare PPO

## 2022-09-30 ENCOUNTER — Other Ambulatory Visit: Payer: Self-pay | Admitting: Family Medicine

## 2022-09-30 DIAGNOSIS — G4733 Obstructive sleep apnea (adult) (pediatric): Secondary | ICD-10-CM | POA: Diagnosis not present

## 2022-09-30 DIAGNOSIS — M5459 Other low back pain: Secondary | ICD-10-CM | POA: Diagnosis not present

## 2022-09-30 DIAGNOSIS — M545 Low back pain, unspecified: Secondary | ICD-10-CM | POA: Insufficient documentation

## 2022-09-30 LAB — CBG MONITORING, ED
Glucose-Capillary: 267 mg/dL — ABNORMAL HIGH (ref 70–99)
Glucose-Capillary: 147 mg/dL — ABNORMAL HIGH (ref 70–99)

## 2022-09-30 MED ORDER — TRAMADOL HCL 50 MG PO TABS: 50.0000 mg | ORAL_TABLET | Freq: Four times a day (QID) | ORAL | 0 refills | Status: DC | PRN

## 2022-09-30 MED ORDER — METHYLPREDNISOLONE SODIUM SUCC 125 MG IJ SOLR
125.0000 mg | Freq: Once | INTRAMUSCULAR | Status: AC
  Administered 2022-09-30: 125 mg via INTRAMUSCULAR
  Filled 2022-09-30: qty 2

## 2022-09-30 MED ORDER — MORPHINE SULFATE (PF) 4 MG/ML IV SOLN
4.0000 mg | Freq: Once | INTRAVENOUS | Status: AC
  Administered 2022-09-30: 4 mg via INTRAMUSCULAR
  Filled 2022-09-30: qty 1

## 2022-09-30 MED ORDER — INSULIN ASPART 100 UNIT/ML IJ SOLN
8.0000 [IU] | Freq: Once | INTRAMUSCULAR | Status: AC
  Administered 2022-09-30: 8 [IU] via SUBCUTANEOUS
  Filled 2022-09-30: qty 1

## 2022-09-30 NOTE — Discharge Instructions (Signed)
Schedule to see Dr. Anitra Lauth for recheck.

## 2022-09-30 NOTE — ED Triage Notes (Signed)
Pt just here for same, lower back and leg pain. States got rx filled but nothing working. Nad. A/o. Using cane

## 2022-10-01 ENCOUNTER — Other Ambulatory Visit: Payer: Self-pay | Admitting: Family Medicine

## 2022-10-02 NOTE — ED Provider Notes (Signed)
[facility] EMERGENCY DEPARTMENT Provider Note   CSN: [id number] Arrival date & time: 09/30/22  1546     History  Chief Complaint  Patient presents with   Back Pain    [name] is a 77 y.o. female.  Patient complains of pain in her low back.  Patient reports she was seen here 2 days ago for the same.  Patient reports that the medications that she was given did not help with the pain.  Patient denies any fever or chills she has not had any burning with urination.  Patient reports pain radiates down her legs  The history is provided by the patient. No language interpreter was used.  Back Pain Location:  Generalized      Home Medications Prior to Admission medications   Medication Sig Start Date End Date Taking? Authorizing Provider  traMADol (ULTRAM) 50 MG tablet Take 1 tablet (50 mg total) by mouth every 6 (six) hours as needed. 09/30/22 09/30/23 Yes Caryl Ada K, PA-C  Accu-Chek Softclix Lancets lancets USE TO CHECK BLOOD GLUCOSE UP TO 6 TIMES DAILY AS DIRECTED 10/01/22   McGowen, Adrian Blackwater, MD  acetaminophen (TYLENOL) 325 MG tablet Take 650 mg by mouth every 6 (six) hours as needed for moderate pain or headache.    [provider]  amitriptyline (ELAVIL) 25 MG tablet Take 25 mg by mouth in the morning and at bedtime.    [provider]  blood glucose meter kit and supplies KIT Use up to six times daily as directed. DX. E11.9 01/05/20   McGowen, Adrian Blackwater, MD  citalopram (CELEXA) 20 MG tablet TAKE 1 TABLET BY MOUTH EVERY DAY Patient taking differently: Take 20 mg by mouth at bedtime. 09/25/21   McGowen, Adrian Blackwater, MD  dicyclomine (BENTYL) 10 MG capsule TAKE 1 CAPSULE (10 MG TOTAL) BY MOUTH EVERY 8 (EIGHT) HOURS AS NEEDED FOR SPASMS (ABDOMINAL PAIN). 09/20/22   Harvel Quale, MD  Dulaglutide (TRULICITY) 1.5 PV/9.4IA SOPN INJECT 1.5MG ONCE WEEKLY 09/04/22   McGowen, Adrian Blackwater, MD  esomeprazole (NEXIUM) 20 MG capsule TAKE 1 CAPSULE BY MOUTH DAILY BEFORE  BREAKFAST 12/04/21   Rehman, Mechele Dawley, MD  ferric carboxymaltose (INJECTAFER) 750 MG/15ML SOLN injection Inject 15 mLs (750 mg total) into the vein as directed for 1 dose. Repeat in 7 days 02/27/21   McGowen, Adrian Blackwater, MD  furosemide (LASIX) 40 MG tablet Take 1 tablet (40 mg total) by mouth daily. 02/20/22   McGowen, Adrian Blackwater, MD  gabapentin (NEURONTIN) 600 MG tablet TAKE 1 AND 1/2 TABLETS BY MOUTH TWICE A DAY 10/01/22   McGowen, Adrian Blackwater, MD  glucose blood (ACCU-CHEK GUIDE) test strip USE TO CHECK BLOOD GLUCOSE UP TO 6 TIMES DAILY AS DIRECTED 02/20/22   McGowen, Adrian Blackwater, MD  HYDROcodone-acetaminophen (NORCO/VICODIN) 5-325 MG tablet Take one tab po q 4 hrs prn pain 09/27/22   Triplett, [name], PA-C  INFLIXIMAB IV Inject into the vein every 8 (eight) weeks. Remicaid 7m/kg [facility]    [provider]  Insulin Pen Needle (B-D UF III MINI PEN NEEDLES) 31G X 5 MM MISC USE TO INJECT INSULINS EQUAL TO 6 TIMES DAILY. 02/20/22   McGowen, PAdrian Blackwater MD  insulin regular human CONCENTRATED (HUMULIN R U-500 KWIKPEN) 500 UNIT/ML KwikPen DIAL 110 UNITS AND INJECT UNDER THE SKIN WITH BREAKFAST AND DINNER, AND 50 UNITS IN THE EVENING. 09/04/22   McGowen, PAdrian Blackwater MD  INVOKANA 100 MG TABS tablet TAKE 1  TABLET BY MOUTH EVERY DAY BEFORE BREAKFAST 09/05/22   McGowen, Adrian Blackwater, MD  lactase (LACTAID) 3000 UNITS tablet Take 3,000 Units by mouth as needed (when eating foods containing dairy).     [provider]  loperamide (IMODIUM) 2 MG capsule Take 1 capsule (2 mg total) by mouth 2 (two) times daily as needed for diarrhea or loose stools. 04/10/21   Rogene Houston, MD  meclizine (ANTIVERT) 25 MG tablet Take 1 tablet (25 mg total) by mouth 3 (three) times daily as needed for dizziness or nausea. 12/07/15   Binnie Rail, MD  methocarbamol (ROBAXIN) 500 MG tablet Take 1 tablet (500 mg total) by mouth 3 (three) times daily. May cause drowsiness 09/27/22   Triplett, [name], PA-C   metoprolol succinate (TOPROL-XL) 100 MG 24 hr tablet TAKE 1 TABLET BY MOUTH 2 TIMES DAILY. TAKE WITH OR IMMEDIATELY FOLLOWING A MEAL 09/25/21   McGowen, Adrian Blackwater, MD  Pediatric Multivitamins-Iron [facility] COMPLETE) 18 MG CHEW Chew 1 tablet by mouth 2 (two) times daily. Patient not taking: Reported on 09/12/2022 04/10/21   Rogene Houston, MD  potassium chloride (KLOR-CON M10) 10 MEQ tablet Take 1 tablet (10 mEq total) by mouth daily. 02/20/22   McGowen, Adrian Blackwater, MD  XARELTO 10 MG TABS tablet TAKE 1 TABLET BY MOUTH EVERY DAY 10/01/22   McGowen, Adrian Blackwater, MD  bromocriptine (PARLODEL) 2.5 MG tablet Take 2.5 mg by mouth 2 (two) times daily.  12/05/11  [provider]  potassium chloride (K-DUR) 10 MEQ tablet Take 2 tablets (20 mEq total) by mouth daily. 02/17/13 08/18/20  Rowe Clack, MD      Allergies    Omeprazole, Actos [pioglitazone], Benzocaine-menthol, Colesevelam, Flagyl [metronidazole hcl], Metformin and related, Shrimp [shellfish allergy], Statins, Allevyn adhesive [wound dressings], Desipramine hcl, Hydromorphone, Jardiance [empagliflozin], Lactose intolerance (gi), Adhesive [tape], Nisoldipine, and Percocet [oxycodone-acetaminophen]    Review of Systems   Review of Systems  Musculoskeletal:  Positive for back pain.  All other systems reviewed and are negative.   Physical Exam Updated Vital Signs BP (!) 153/59 (BP Location: Right Arm)   Pulse 72   Temp 97.8 F (36.6 C) (Oral)   Resp 16   SpO2 97%  Physical Exam Vitals and nursing note reviewed.  Constitutional:      Appearance: She is well-developed.  HENT:     Head: Normocephalic.  Cardiovascular:     Rate and Rhythm: Normal rate and regular rhythm.  Pulmonary:     Effort: Pulmonary effort is normal.  Abdominal:     General: There is no distension.  Musculoskeletal:        General: Normal range of motion.     Cervical back: Normal range of motion.  Skin:    General: Skin is warm.  Neurological:      General: No focal deficit present.     Mental Status: She is alert and oriented to person, place, and time.     ED Results / Procedures / Treatments   Labs (all labs ordered are listed, but only abnormal results are displayed) Labs Reviewed  CBG MONITORING, ED - Abnormal; Notable for the following components:      Result Value   Glucose-Capillary 267 (*)    All other components within normal limits  CBG MONITORING, ED - Abnormal; Notable for the following components:   Glucose-Capillary 147 (*)    All other components within normal limits    EKG None  Radiology DG Lumbar Spine Complete  Result Date: 09/30/2022 CLINICAL DATA:  Back pain EXAM: LUMBAR SPINE - COMPLETE 4+ VIEW COMPARISON:  CT 06/07/2014 FINDINGS: Lumbar alignment within normal limits. Vertebral body heights are maintained. Mild multilevel degenerative osteophytes. Mild disc space narrowing L1-L2 and L2-L3. Hypertrophic facet degenerative changes at the lower lumbar spine. IMPRESSION: Mild degenerative changes. No acute osseous abnormality. Electronically Signed   By: Donavan Foil M.D.   On: 09/30/2022 20:25    Procedures Procedures    Medications Ordered in ED Medications  morphine (PF) 4 MG/ML injection 4 mg (4 mg Intramuscular Given 09/30/22 1937)  methylPREDNISolone sodium succinate (SOLU-MEDROL) 125 mg/2 mL injection 125 mg (125 mg Intramuscular Given 09/30/22 2116)  insulin aspart (novoLOG) injection 8 Units (8 Units Subcutaneous Given 09/30/22 2118)    ED Course/ Medical Decision Making/ A&P                           Medical Decision Making Patient complains of pain in her lower back she reports no relief from the medicine she was given on last ED visit  Amount and/or Complexity of Data Reviewed Independent Historian: spouse    Details: Is here with a family member who is supportive External Data Reviewed: notes.    Details: Review his ED notes are reviewed Labs: ordered. Decision-making details documented  in ED Course.    Details: Patient CBG is ordered reviewed and interpreted 167 Radiology: ordered and independent interpretation performed. Decision-making details documented in ED Course.    Details: LS-spine x-ray shows degenerative changes no acute abnormality  Risk Prescription drug management. Risk Details: I counseled the patient on treatment options she did not take her diabetes medicine today.  I am concerned that prednisone will cause her to have an increased glucose.  I will give her an injection of Solu-Medrol here to see if this will help with the inflammation patient is given a prescription for tramadol.  Patient is advised to follow-up with her physician for recheck           Final Clinical Impression(s) / ED Diagnoses Final diagnoses:  Acute low back pain without sciatica, unspecified back pain laterality    Rx / DC Orders ED Discharge Orders          Ordered    traMADol (ULTRAM) 50 MG tablet  Every 6 hours PRN        09/30/22 2145           An After Visit Summary was printed and given to the patient.    Sidney Ace 10/02/22 1521    Milton Ferguson, MD 10/05/22 1208

## 2022-10-03 ENCOUNTER — Other Ambulatory Visit: Payer: Self-pay | Admitting: Family Medicine

## 2022-10-03 ENCOUNTER — Telehealth (INDEPENDENT_AMBULATORY_CARE_PROVIDER_SITE_OTHER): Payer: Self-pay

## 2022-10-03 ENCOUNTER — Telehealth: Payer: Self-pay

## 2022-10-03 DIAGNOSIS — E1140 Type 2 diabetes mellitus with diabetic neuropathy, unspecified: Secondary | ICD-10-CM

## 2022-10-03 NOTE — Telephone Encounter (Signed)
        Patient  visited [place] on 1/1    Telephone encounter attempt :  1st  A HIPAA compliant voice message was left requesting a return call.  Instructed patient to call back   [place]  [phone] [address], [place], [place] [address] Phone: [phone] Email: [email or web address]

## 2022-10-03 NOTE — Telephone Encounter (Signed)
Available without authorization. Drug NEXIUM 20 MG delayed-release capsules ePA [employer], building services Electronic PA Form

## 2022-10-04 ENCOUNTER — Ambulatory Visit: Payer: Medicare PPO | Admitting: Family Medicine

## 2022-10-04 ENCOUNTER — Telehealth: Payer: Self-pay

## 2022-10-04 ENCOUNTER — Encounter: Payer: Self-pay | Admitting: Family Medicine

## 2022-10-04 VITALS — BP 112/81 | HR 99 | Temp 98.3°F | Ht 62.0 in | Wt 219.4 lb

## 2022-10-04 DIAGNOSIS — M5442 Lumbago with sciatica, left side: Secondary | ICD-10-CM | POA: Diagnosis not present

## 2022-10-04 DIAGNOSIS — M5441 Lumbago with sciatica, right side: Secondary | ICD-10-CM

## 2022-10-04 DIAGNOSIS — D849 Immunodeficiency, unspecified: Secondary | ICD-10-CM

## 2022-10-04 DIAGNOSIS — N319 Neuromuscular dysfunction of bladder, unspecified: Secondary | ICD-10-CM | POA: Diagnosis not present

## 2022-10-04 DIAGNOSIS — R3915 Urgency of urination: Secondary | ICD-10-CM

## 2022-10-04 DIAGNOSIS — M47816 Spondylosis without myelopathy or radiculopathy, lumbar region: Secondary | ICD-10-CM | POA: Diagnosis not present

## 2022-10-04 LAB — POCT URINALYSIS DIPSTICK
Bilirubin, UA: NEGATIVE
Blood, UA: NEGATIVE
Glucose, UA: POSITIVE — AB
Ketones, UA: NEGATIVE
Leukocytes, UA: NEGATIVE
Nitrite, UA: NEGATIVE
Protein, UA: NEGATIVE
Spec Grav, UA: 1.01 (ref 1.010–1.025)
Urobilinogen, UA: 1 E.U./dL
pH, UA: 6 (ref 5.0–8.0)

## 2022-10-04 MED ORDER — OXYCODONE HCL 5 MG PO TABS: ORAL_TABLET | ORAL | 0 refills | Status: DC

## 2022-10-04 NOTE — Progress Notes (Signed)
OFFICE VISIT  10/04/2022  CC: No chief complaint on file.   Patient is a 77 y.o. female who presents for back pain.  HPI: Acute onset about 10 days ago of pain all across her low back and radiating down the backs of both legs to the knee level.  Intensity is severe.  No tingling, numbness, or focal weakness. No saddle anesthesia.  She has been having a lot more trouble holding her urine in lately--- this is superimposed on chronic urinary urgency/incontinence.  No problem controlling anal sphincter/bowels. She had no change in her activity level prior to onset of the symptoms.  No trauma or falls. She went to the emergency department for this twice since it started. Lumbar spine radiographs 09/30/2022 in [facility] emergency department: Mild multilevel degenerative changes. Tramadol prescribed, no help.  Hydrocodone prescribed, no help.  Morphine injection in the emergency department, no help.  Steroid injection in the ED, no help.  Hx of back surgery in 2000: Right L4-5 laminectomy (Dr. Joya Salm).  Today when she woke up she had a stiff neck and some pain in the muscles of her neck.  ROS: No fever.  No new pain in shoulders, elbows, hands, hips, knees, or lower extremities.  No rash.  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 ? to get liver bx if she gets bariatric surgery? Mild portal hypertensive gastropathy on EGD 08/2019.   Dysrhythmia    Fibromyalgia    GAD (generalized anxiety disorder)    GERD    History of hiatal hernia    History of iron deficiency anemia 12/2018   Inadequate absorption secondary to chronic/long term PPI therapy + portal hypertensive gastroduodenopathy. No GIB found on EGD, colonosc, and givens. Iron infusions X multiple.   History of thrombocytopenia 12/2011   Hyperlipidemia    Intolerant of statins    HYPERTENSION    IBS (irritable bowel syndrome)    -D.  Good response to bentyl and imodium as of 06/2018 GI f/u.   IDDM (insulin dependent diabetes mellitus)    with DPN (managed by Dr. Cruzita Lederer but then in 2018 pt preferred to have me manage for her convenience)   Limited mobility    Requires a walker for arthritic pain, widespread musculoskeletal pain, and neuropathic pain.   Morbid obesity ([place])    As of 11/2018, pt considering sleeve gastectomy vs bipass as of eval by Dr. De Burrs considering as of 12/2019.   Nonalcoholic steatohepatitis    Viral Hep screens NEG.  CT 2015.  Transaminasemia.  U/S 07/2018 showed early changes of cirrhosis.   OSA (obstructive sleep apnea) 09/14/2015   sleep study 09/07/15: severe obstructive sleep apnea with an AHI of 72 and SaO2 low of 75%.>referred to sleep MD   Osteoarthritis    hips, shoulders, knees   PAF (paroxysmal atrial fibrillation) ([place])    One documented episode (after getting EGD 2016).  Was on amiodarone x 3 mo.  Rate control with metoprolol + anticoag with xarelto.    Recurrent epistaxis    Granuloma in L nare cauterized by ENT 04/2020. Another cautery 06/2020   Small fiber neuropathy    Due to DM.  Symmetric hands and feet tingling/numbness.   Ulcerative colitis ([place])    Remicade infusion Q 8 weeks: in clinical and endoscopic remission as of 12/2018 GI f/u.  06/11/19 rpt colonoscopy->cecal  and ascending colon colitis.    Past Surgical History:  Procedure Laterality Date   ABDOMINAL HYSTERECTOMY  1980   Paps no longer indicated.   BACK SURGERY     BACTERIAL OVERGROWTH TEST N/A 07/13/2015   Procedure: BACTERIAL OVERGROWTH TEST;  Surgeon: Rogene Houston, MD;  [facility];  Service: Endoscopy;  Laterality: N/A;  730     BILATERAL SALPINGOOPHORECTOMY  02/10/2001   BIOPSY  06/11/2019   Procedure: BIOPSY;  Surgeon: Rogene Houston, MD;  [facility];  Service: Endoscopy;;  colon   BIOPSY  09/27/2019   Procedure:  BIOPSY;  Surgeon: Rogene Houston, MD;  [facility];  Service: Endoscopy;;  gastric duodenal   BIOPSY  09/15/2020   Procedure: BIOPSY;  Surgeon: Harvel Quale, MD;  [facility];  Service: Gastroenterology;;   BREAST REDUCTION SURGERY  1994   bilat   BREAST SURGERY     CARDIOVASCULAR STRESS TEST  07/2010   Lexiscan myoview: normal   CARPAL TUNNEL RELEASE Right 1996   CARPAL TUNNEL RELEASE Left 03/21/2003   CARPAL TUNNEL RELEASE Right 05/04/2013   Procedure: CARPAL TUNNEL RELEASE;  Surgeon: Cammie Sickle., MD;  Location: [place];  Service: Orthopedics;  Laterality: Right;   CARPAL TUNNEL RELEASE Left 09/21/2013   Procedure: LEFT CARPAL TUNNEL RELEASE;  Surgeon: Cammie Sickle., MD;  Location: [place];  Service: Orthopedics;  Laterality: Left;   CHOLECYSTECTOMY     COLONOSCOPY WITH PROPOFOL N/A 08/04/2015   Colitis in remission.  No polyps.  Procedure: COLONOSCOPY WITH PROPOFOL;  Surgeon: Rogene Houston, MD;  [facility];  Service: Endoscopy;  Laterality: N/A;  cecum time in  0820   time out  0827    total time 7 minutes   COLONOSCOPY WITH PROPOFOL N/A 06/11/2019   cecal and ascending colon colitis.  Procedure: COLONOSCOPY WITH PROPOFOL;  Surgeon: Rogene Houston, MD;  [facility];  Service: Endoscopy;  Laterality: N/A;  730a   COLONOSCOPY WITH PROPOFOL N/A 09/15/2020   Procedure: COLONOSCOPY WITH PROPOFOL;  Surgeon: Harvel Quale, MD;  [facility];  Service: Gastroenterology;  Laterality: N/A;  1030   ESOPHAGEAL DILATION N/A 08/04/2015   Procedure: ESOPHAGEAL DILATION;  Surgeon: Rogene Houston, MD;  [facility];  Service: Endoscopy;  Laterality: N/AKelvin Cellar, no mucousal disruption   ESOPHAGOGASTRODUODENOSCOPY  09/27/2019   Performed for IDA.  Esoph dilation was done but no stricture present.  Mild portal hypertensive gastropathy, o/w normal.  Duodenal bx NEG.   h pylori neg.   ESOPHAGOGASTRODUODENOSCOPY (EGD) WITH ESOPHAGEAL DILATION  12/02/2005   ESOPHAGOGASTRODUODENOSCOPY (EGD) WITH PROPOFOL N/A 08/04/2015   Procedure: ESOPHAGOGASTRODUODENOSCOPY (EGD) WITH PROPOFOL;  Surgeon: Rogene Houston, MD;  [facility];  Service: Endoscopy;  Laterality: N/A;  procedure 1   ESOPHAGOGASTRODUODENOSCOPY (EGD) WITH PROPOFOL N/A 09/27/2019   Procedure: ESOPHAGOGASTRODUODENOSCOPY (EGD) WITH PROPOFOL;  Surgeon: Rogene Houston, MD;  [facility];  Service: Endoscopy;  Laterality: N/A;  12:10   ESOPHAGOGASTRODUODENOSCOPY (EGD) WITH PROPOFOL N/A 10/31/2021   Procedure: ESOPHAGOGASTRODUODENOSCOPY (EGD) WITH PROPOFOL;  Surgeon: Rogene Houston, MD;  [facility];  Service: Endoscopy;  Laterality: N/A;  [place]  01/17/2012   Procedure: FLEXIBLE SIGMOIDOSCOPY;  Surgeon: Rogene Houston, MD;  [facility];  Service: Endoscopy;  Laterality: N/A;   GIVENS CAPSULE STUDY N/A 08/03/2019  Procedure: GIVENS CAPSULE STUDY (performed for IDA)->some food debris in stomach and small amount of blood.  Surgeon: Rogene Houston, MD;  [facility];  Service: Endoscopy;  Laterality: N/A;  730AM   HEMILAMINOTOMY LUMBAR SPINE Bilateral 09/07/1999   L4-5   KNEE ARTHROSCOPY Right 01/1999; 10/2000   LYSIS OF ADHESION  02/10/2001   MALONEY DILATION  09/27/2019   Procedure: MALONEY DILATION;  Surgeon: Rogene Houston, MD;  [facility];  Service: Endoscopy;;   NASAL ENDOSCOPY WITH EPISTAXIS CONTROL Bilateral 01/25/2022   Procedure: NASAL ENDOSCOPY WITH EPISTAXIS CONTROL;  Surgeon: Jerrell Belfast, MD;  Location: [place];  Service: ENT;  Laterality: Bilateral;   NASAL SEPTOPLASTY W/ TURBINOPLASTY Bilateral 01/25/2022   Procedure: NASAL SEPTOPLASTY WITH TURBINATE REDUCTION;  Surgeon: Jerrell Belfast, MD;  Location: [place];  Service: ENT;  Laterality: Bilateral;   [place]; 09/12/2006   TARSAL TUNNEL  RELEASE  2002   TRANSTHORACIC ECHOCARDIOGRAM  08/04/2015   EF 60-65%, normal wall motion, mild LVH, mild LA dilation, grd I DD.   TUMOR EXCISION Left 03/21/2003   dorsal 1st web space (hand)   URETEROLYSIS Right 02/10/2001    Outpatient Medications Prior to Visit  Medication Sig Dispense Refill   Accu-Chek Softclix Lancets lancets USE TO CHECK BLOOD GLUCOSE UP TO 6 TIMES DAILY AS DIRECTED 200 each 1   acetaminophen (TYLENOL) 325 MG tablet Take 650 mg by mouth every 6 (six) hours as needed for moderate pain or headache.     amitriptyline (ELAVIL) 25 MG tablet Take 25 mg by mouth in the morning and at bedtime.     blood glucose meter kit and supplies KIT Use up to six times daily as directed. DX. E11.9 1 each 0   citalopram (CELEXA) 20 MG tablet TAKE 1 TABLET BY MOUTH EVERY DAY (Patient taking differently: Take 20 mg by mouth at bedtime.) 90 tablet 3   dicyclomine (BENTYL) 10 MG capsule TAKE 1 CAPSULE (10 MG TOTAL) BY MOUTH EVERY 8 (EIGHT) HOURS AS NEEDED FOR SPASMS (ABDOMINAL PAIN). 270 capsule 1   Dulaglutide (TRULICITY) 1.5 PX/1.0GY SOPN INJECT 1.'5MG'$  ONCE WEEKLY 18 mL 3   esomeprazole (NEXIUM) 20 MG capsule TAKE 1 CAPSULE BY MOUTH DAILY BEFORE BREAKFAST 90 capsule 3   furosemide (LASIX) 40 MG tablet Take 1 tablet (40 mg total) by mouth daily. 90 tablet 1   gabapentin (NEURONTIN) 600 MG tablet TAKE 1 AND 1/2 TABLETS BY MOUTH TWICE A DAY 90 tablet 0   glucose blood (ACCU-CHEK GUIDE) test strip USE TO CHECK BLOOD GLUCOSE UP TO 6 TIMES DAILY AS DIRECTED 200 strip 1   INFLIXIMAB IV Inject into the vein every 8 (eight) weeks. Remicaid '5mg'$ /kg Infusion every 8 weeks [facility])     Insulin Pen Needle (B-D UF III MINI PEN NEEDLES) 31G X 5 MM MISC USE TO INJECT INSULINS EQUAL TO 6 TIMES DAILY. 600 each 5   insulin regular human CONCENTRATED (HUMULIN R U-500 KWIKPEN) 500 UNIT/ML KwikPen DIAL AND INJECT 110 UNITS UNDER THE SKIN WITH BREAKFAST AND DINNER AND 50 UNITS IN EVENING. 48 mL 0    INVOKANA 100 MG TABS tablet TAKE 1 TABLET BY MOUTH EVERY DAY BEFORE BREAKFAST 90 tablet 0   lactase (LACTAID) 3000 UNITS tablet Take 3,000 Units by mouth as needed (when eating foods containing dairy).      loperamide (IMODIUM) 2 MG capsule Take 1 capsule (2 mg total) by mouth 2 (two) times daily as needed for diarrhea  or loose stools. 90 capsule 5   meclizine (ANTIVERT) 25 MG tablet Take 1 tablet (25 mg total) by mouth 3 (three) times daily as needed for dizziness or nausea. 90 tablet 0   methocarbamol (ROBAXIN) 500 MG tablet Take 1 tablet (500 mg total) by mouth 3 (three) times daily. May cause drowsiness 21 tablet 0   metoprolol succinate (TOPROL-XL) 100 MG 24 hr tablet TAKE 1 TABLET BY MOUTH 2 TIMES DAILY. TAKE WITH OR IMMEDIATELY FOLLOWING A MEAL 180 tablet 3   Pediatric Multivitamins-Iron (FLINTSTONES COMPLETE) 18 MG CHEW Chew 1 tablet by mouth 2 (two) times daily.     potassium chloride (KLOR-CON M10) 10 MEQ tablet Take 1 tablet (10 mEq total) by mouth daily. 180 tablet 0   XARELTO 10 MG TABS tablet TAKE 1 TABLET BY MOUTH EVERY DAY 30 tablet 0   HYDROcodone-acetaminophen (NORCO/VICODIN) 5-325 MG tablet Take one tab po q 4 hrs prn pain 10 tablet 0   traMADol (ULTRAM) 50 MG tablet Take 1 tablet (50 mg total) by mouth every 6 (six) hours as needed. 12 tablet 0   ferric carboxymaltose (INJECTAFER) 750 MG/15ML SOLN injection Inject 15 mLs (750 mg total) into the vein as directed for 1 dose. Repeat in 7 days (Patient not taking: Reported on 10/04/2022) 15 mL 1   No facility-administered medications prior to visit.    Allergies  Allergen Reactions   Omeprazole Anaphylaxis and Swelling    SWELLING OF TONGUE AND THROAT   Actos [Pioglitazone] Swelling and Other (See Comments)    Weight gain, tongue swelling    Benzocaine-Menthol Swelling    SWELLING OF MOUTH   Colesevelam Other (See Comments)    GI UPSET   Flagyl [Metronidazole Hcl] Other (See Comments)    DIAPHORESIS   Metformin And Related  Diarrhea   Shrimp [Shellfish Allergy] Itching    OF THROAT AND EARS, if consumed raw   Statins Palpitations   Allevyn Adhesive [Wound Dressings]     Other reaction(s): Unknown   Desipramine Hcl Itching, Nausea Only and Other (See Comments)    "swimmy" headed, ears itched    Hydromorphone Itching   Jardiance [Empagliflozin] Other (See Comments)    weakness   Lactose Intolerance (Gi) Diarrhea    Gas, bloating   Adhesive [Tape] Other (See Comments)    SKIN IRRITATION AND BRUISING   Nisoldipine Itching   Percocet [Oxycodone-Acetaminophen] Itching    Review of Systems  As per HPI  PE:    10/04/2022   10:08 AM 09/30/2022    9:53 PM 09/30/2022    9:20 PM  Vitals with BMI  Height '5\' 2"'$     Weight 219 lbs 6 oz    BMI 16.10    Systolic 960  454  Diastolic 81  59  Pulse 99 72    Physical Exam  General: No tenderness of the back or gluteal region. All range of motion of the lumbar spine is significantly impaired and painful. MOTOR EXAMINATION:   Lower Extremity: L2,3 (hip flexors) Left: 5/5. Right: 5/5 L3,4 (knee extensors) Left: 5/5. Right: 5/5 L4,5 (ankle dorsiflexion/TA) Left: 5/5. Right: 5/5 L5 (EHL) Left: 5/5. Right: 5/5 S1 (ankle plantarflexion): Left: 5/5. Right: 5/5 L5-S2 (Knee flexors) Left 5/5.  Right 5/5  REFLEXES:  L4 Patellar tendon: absent bilaterally S1 Achilles tendon: 1+ bilaterally  LABS:  Last CBC Lab Results  Component Value Date   WBC 5.8 08/28/2022   HGB 15.2 (H) 08/28/2022   HCT 43.5 08/28/2022   MCV 95.8  08/28/2022   MCH 29.3 01/22/2022   RDW 20.9 (H) 08/28/2022   PLT 110.0 (L) 08/28/2022   Lab Results  Component Value Date   IRON 129 08/28/2022   TIBC 243 (L) 08/28/2022   FERRITIN 333 (H) 77/07/6578   Last metabolic panel Lab Results  Component Value Date   GLUCOSE 197 (H) 08/28/2022   NA 140 08/28/2022   K 3.5 08/28/2022   CL 102 08/28/2022   CO2 32 08/28/2022   BUN 10 08/28/2022   CREATININE 0.67 08/28/2022   GFRNONAA >60  01/25/2022   CALCIUM 8.2 (L) 08/28/2022   PROT 7.3 08/28/2022   ALBUMIN 3.4 (L) 08/28/2022   LABGLOB 3.4 08/17/2015   AGRATIO 1.0 08/17/2015   BILITOT 1.0 08/28/2022   ALKPHOS 112 08/28/2022   AST 64 (H) 08/28/2022   ALT 49 (H) 08/28/2022   ANIONGAP 6 01/25/2022   Lab Results  Component Value Date   INR 2.5 (H) 05/23/2022   INR 1.4 (H) 10/16/2021   INR 1.3 (H) 09/13/2020   Last hemoglobin A1c Lab Results  Component Value Date   HGBA1C 6.1 (A) 08/28/2022   HGBA1C 6.1 08/28/2022   HGBA1C 6.1 08/28/2022   HGBA1C 6.1 08/28/2022   IMPRESSION AND PLAN:  #1 acute low back pain, history of spondylosis, remote history of lumbar laminectomy. No preceding overuse, strain, or trauma. Patient is immunocompromised due to being on Remicade for her ulcerative colitis. She would be unable to participate in PT at this time due to the severity of the pain. Will try to get her pain better controlled with oxycodone 5 mg, 1-2 every 6 hours as needed, #42. Additionally, will check MRI L-spine without contrast to try to see if she has acute degenerative correlate with her pain, make sure no signs of alternative diagnosis such as sacroiliitis or osteomyelitis. Ordered referral to Dr. Lynann Bologna and orthopedics today.  #2 urinary incontinence. Low suspicion of cauda equina syndrome. UA today showed 2+ glucose, otherwise normal. Reassured at this time. Sent urine for culture.  An After Visit Summary was printed and given to the patient.  FOLLOW UP: Return for 3-4d f/u back pain.  Signed:  Crissie Sickles, MD           10/04/2022

## 2022-10-04 NOTE — Telephone Encounter (Signed)
        Patient  visited [place] on 1/1   Telephone encounter attempt : 2nd   A HIPAA compliant voice message was left requesting a return call.  Instructed patient to call back .   [place], Care Management  [phone] [address], [place], [place] [address] Phone: [phone] Email: [email or web address]

## 2022-10-06 LAB — URINE CULTURE
MICRO NUMBER:: 14394571
Result:: NO GROWTH
SPECIMEN QUALITY:: ADEQUATE

## 2022-10-07 ENCOUNTER — Other Ambulatory Visit: Payer: Self-pay | Admitting: Family Medicine

## 2022-10-09 ENCOUNTER — Other Ambulatory Visit: Payer: Self-pay

## 2022-10-09 ENCOUNTER — Emergency Department (HOSPITAL_COMMUNITY): Payer: Medicare PPO

## 2022-10-09 ENCOUNTER — Encounter (HOSPITAL_COMMUNITY): Payer: Self-pay

## 2022-10-09 ENCOUNTER — Observation Stay (HOSPITAL_COMMUNITY)
Admission: EM | Admit: 2022-10-09 | Discharge: 2022-10-10 | Disposition: A | Discharge status: Home / Self Care | Payer: Medicare PPO | Attending: Internal Medicine | Admitting: Internal Medicine

## 2022-10-09 ENCOUNTER — Observation Stay (HOSPITAL_COMMUNITY): Payer: Medicare PPO

## 2022-10-09 DIAGNOSIS — G9341 Metabolic encephalopathy: Principal | ICD-10-CM | POA: Diagnosis present

## 2022-10-09 DIAGNOSIS — K5190 Ulcerative colitis, unspecified, without complications: Secondary | ICD-10-CM | POA: Diagnosis present

## 2022-10-09 DIAGNOSIS — Z79899 Other long term (current) drug therapy: Secondary | ICD-10-CM | POA: Diagnosis not present

## 2022-10-09 DIAGNOSIS — R531 Weakness: Secondary | ICD-10-CM | POA: Diagnosis not present

## 2022-10-09 DIAGNOSIS — T68XXXA Hypothermia, initial encounter: Secondary | ICD-10-CM | POA: Diagnosis not present

## 2022-10-09 DIAGNOSIS — I480 Paroxysmal atrial fibrillation: Secondary | ICD-10-CM | POA: Insufficient documentation

## 2022-10-09 DIAGNOSIS — I10 Essential (primary) hypertension: Secondary | ICD-10-CM | POA: Diagnosis not present

## 2022-10-09 DIAGNOSIS — Z1152 Encounter for screening for COVID-19: Secondary | ICD-10-CM | POA: Diagnosis not present

## 2022-10-09 DIAGNOSIS — E161 Other hypoglycemia: Secondary | ICD-10-CM | POA: Diagnosis not present

## 2022-10-09 DIAGNOSIS — E1140 Type 2 diabetes mellitus with diabetic neuropathy, unspecified: Secondary | ICD-10-CM

## 2022-10-09 DIAGNOSIS — M48061 Spinal stenosis, lumbar region without neurogenic claudication: Secondary | ICD-10-CM | POA: Diagnosis not present

## 2022-10-09 DIAGNOSIS — R55 Syncope and collapse: Secondary | ICD-10-CM | POA: Diagnosis not present

## 2022-10-09 DIAGNOSIS — K7460 Unspecified cirrhosis of liver: Secondary | ICD-10-CM | POA: Diagnosis not present

## 2022-10-09 DIAGNOSIS — R0689 Other abnormalities of breathing: Secondary | ICD-10-CM | POA: Diagnosis not present

## 2022-10-09 DIAGNOSIS — E139 Other specified diabetes mellitus without complications: Secondary | ICD-10-CM | POA: Diagnosis not present

## 2022-10-09 DIAGNOSIS — E876 Hypokalemia: Secondary | ICD-10-CM | POA: Diagnosis not present

## 2022-10-09 DIAGNOSIS — M545 Low back pain, unspecified: Secondary | ICD-10-CM

## 2022-10-09 DIAGNOSIS — Z7901 Long term (current) use of anticoagulants: Secondary | ICD-10-CM

## 2022-10-09 DIAGNOSIS — G4733 Obstructive sleep apnea (adult) (pediatric): Secondary | ICD-10-CM | POA: Diagnosis present

## 2022-10-09 DIAGNOSIS — E162 Hypoglycemia, unspecified: Secondary | ICD-10-CM

## 2022-10-09 DIAGNOSIS — W938XXA Exposure to other excessive cold of man-made origin, initial encounter: Secondary | ICD-10-CM | POA: Insufficient documentation

## 2022-10-09 DIAGNOSIS — E119 Type 2 diabetes mellitus without complications: Secondary | ICD-10-CM

## 2022-10-09 DIAGNOSIS — E11649 Type 2 diabetes mellitus with hypoglycemia without coma: Secondary | ICD-10-CM | POA: Insufficient documentation

## 2022-10-09 DIAGNOSIS — Z794 Long term (current) use of insulin: Secondary | ICD-10-CM | POA: Diagnosis not present

## 2022-10-09 DIAGNOSIS — R0902 Hypoxemia: Secondary | ICD-10-CM | POA: Diagnosis not present

## 2022-10-09 DIAGNOSIS — R4182 Altered mental status, unspecified: Secondary | ICD-10-CM | POA: Insufficient documentation

## 2022-10-09 DIAGNOSIS — K5100 Ulcerative (chronic) pancolitis without complications: Secondary | ICD-10-CM

## 2022-10-09 DIAGNOSIS — M549 Dorsalgia, unspecified: Secondary | ICD-10-CM | POA: Diagnosis present

## 2022-10-09 DIAGNOSIS — R400 Somnolence: Secondary | ICD-10-CM | POA: Diagnosis present

## 2022-10-09 DIAGNOSIS — M4316 Spondylolisthesis, lumbar region: Secondary | ICD-10-CM | POA: Diagnosis not present

## 2022-10-09 DIAGNOSIS — G9340 Encephalopathy, unspecified: Secondary | ICD-10-CM | POA: Diagnosis not present

## 2022-10-09 DIAGNOSIS — S32050A Wedge compression fracture of fifth lumbar vertebra, initial encounter for closed fracture: Secondary | ICD-10-CM | POA: Diagnosis not present

## 2022-10-09 LAB — RESP PANEL BY RT-PCR (RSV, FLU A&B, COVID)  RVPGX2
Influenza A by PCR: NEGATIVE
Influenza B by PCR: NEGATIVE
Resp Syncytial Virus by PCR: NEGATIVE
SARS Coronavirus 2 by RT PCR: NEGATIVE

## 2022-10-09 LAB — COMPREHENSIVE METABOLIC PANEL
ALT: 47 U/L — ABNORMAL HIGH (ref 0–44)
AST: 69 U/L — ABNORMAL HIGH (ref 15–41)
Albumin: 3 g/dL — ABNORMAL LOW (ref 3.5–5.0)
Alkaline Phosphatase: 142 U/L — ABNORMAL HIGH (ref 38–126)
Anion gap: 6 (ref 5–15)
BUN: 14 mg/dL (ref 8–23)
CO2: 26 mmol/L (ref 22–32)
Calcium: 8.1 mg/dL — ABNORMAL LOW (ref 8.9–10.3)
Chloride: 105 mmol/L (ref 98–111)
Creatinine, Ser: 0.58 mg/dL (ref 0.44–1.00)
GFR, Estimated: >60 mL/min (ref >60)
Glucose, Bld: 100 mg/dL — ABNORMAL HIGH (ref 70–99)
Potassium: 3 mmol/L — ABNORMAL LOW (ref 3.5–5.1)
Sodium: 137 mmol/L (ref 135–145)
Total Bilirubin: 0.9 mg/dL (ref 0.3–1.2)
Total Protein: 7.4 g/dL (ref 6.5–8.1)

## 2022-10-09 LAB — CBC WITH DIFFERENTIAL/PLATELET
Abs Immature Granulocytes: 0.02 10*3/uL (ref 0.00–0.07)
Basophils Absolute: 0 10*3/uL (ref 0.0–0.1)
Basophils Relative: 1 %
Eosinophils Absolute: 0 10*3/uL (ref 0.0–0.5)
Eosinophils Relative: 1 %
HCT: 40.5 % (ref 36.0–46.0)
Hemoglobin: 13.8 g/dL (ref 12.0–15.0)
Immature Granulocytes: 0 %
Lymphocytes Relative: 25 %
Lymphs Abs: 1.3 10*3/uL (ref 0.7–4.0)
MCH: 33.9 pg (ref 26.0–34.0)
MCHC: 34.1 g/dL (ref 30.0–36.0)
MCV: 99.5 fL (ref 80.0–100.0)
Monocytes Absolute: 0.4 10*3/uL (ref 0.1–1.0)
Monocytes Relative: 9 %
Neutro Abs: 3.3 10*3/uL (ref 1.7–7.7)
Neutrophils Relative %: 64 %
Platelets: 143 10*3/uL — ABNORMAL LOW (ref 150–400)
RBC: 4.07 MIL/uL (ref 3.87–5.11)
RDW: 13.9 % (ref 11.5–15.5)
WBC: 5.1 10*3/uL (ref 4.0–10.5)
nRBC: 0 % (ref 0.0–0.2)

## 2022-10-09 LAB — PROCALCITONIN: Procalcitonin: <0.1 ng/mL

## 2022-10-09 LAB — URINALYSIS, ROUTINE W REFLEX MICROSCOPIC
Bilirubin Urine: NEGATIVE
Glucose, UA: >=500 mg/dL — AB
Hgb urine dipstick: NEGATIVE
Ketones, ur: NEGATIVE mg/dL
Leukocytes,Ua: NEGATIVE
Nitrite: NEGATIVE
Protein, ur: 30 mg/dL — AB
Specific Gravity, Urine: 1.025 (ref 1.005–1.030)
pH: 5 (ref 5.0–8.0)

## 2022-10-09 LAB — BLOOD GAS, ARTERIAL
Acid-Base Excess: 2.1 mmol/L — ABNORMAL HIGH (ref 0.0–2.0)
Bicarbonate: 28.7 mmol/L — ABNORMAL HIGH (ref 20.0–28.0)
Drawn by: 41977
FIO2: 21 %
O2 Saturation: 94.1 %
Patient temperature: 35
pCO2 arterial: 48 mmHg (ref 32–48)
pH, Arterial: 7.38 (ref 7.35–7.45)
pO2, Arterial: 58 mmHg — ABNORMAL LOW (ref 83–108)

## 2022-10-09 LAB — CBG MONITORING, ED
Glucose-Capillary: 119 mg/dL — ABNORMAL HIGH (ref 70–99)
Glucose-Capillary: 66 mg/dL — ABNORMAL LOW (ref 70–99)
Glucose-Capillary: 112 mg/dL — ABNORMAL HIGH (ref 70–99)
Glucose-Capillary: 65 mg/dL — ABNORMAL LOW (ref 70–99)
Glucose-Capillary: 148 mg/dL — ABNORMAL HIGH (ref 70–99)

## 2022-10-09 LAB — MAGNESIUM: Magnesium: 2.2 mg/dL (ref 1.7–2.4)

## 2022-10-09 LAB — RAPID URINE DRUG SCREEN, HOSP PERFORMED
Amphetamines: NOT DETECTED
Barbiturates: NOT DETECTED
Benzodiazepines: NOT DETECTED
Cocaine: NOT DETECTED
Opiates: POSITIVE — AB
Tetrahydrocannabinol: NOT DETECTED

## 2022-10-09 LAB — LACTIC ACID, PLASMA: Lactic Acid, Venous: 1.9 mmol/L (ref 0.5–1.9)

## 2022-10-09 LAB — TROPONIN I (HIGH SENSITIVITY)
Troponin I (High Sensitivity): 3 ng/L (ref <18)
Troponin I (High Sensitivity): 4 ng/L (ref <18)

## 2022-10-09 LAB — GLUCOSE, CAPILLARY: Glucose-Capillary: 112 mg/dL — ABNORMAL HIGH (ref 70–99)

## 2022-10-09 LAB — ACETAMINOPHEN LEVEL: Acetaminophen (Tylenol), Serum: <10 ug/mL — ABNORMAL LOW (ref 10–30)

## 2022-10-09 LAB — AMMONIA: Ammonia: 62 umol/L — ABNORMAL HIGH (ref 9–35)

## 2022-10-09 LAB — TSH: TSH: 0.618 u[IU]/mL (ref 0.350–4.500)

## 2022-10-09 MED ORDER — POTASSIUM CHLORIDE 10 MEQ/100ML IV SOLN
10.0000 meq | Freq: Once | INTRAVENOUS | Status: AC
  Administered 2022-10-09: 10 meq via INTRAVENOUS
  Filled 2022-10-09: qty 100

## 2022-10-09 MED ORDER — POLYETHYLENE GLYCOL 3350 17 G PO PACK: 17.0000 g | PACK | Freq: Every day | ORAL | Status: DC | PRN

## 2022-10-09 MED ORDER — PANTOPRAZOLE SODIUM 40 MG PO TBEC
40.0000 mg | DELAYED_RELEASE_TABLET | Freq: Every day | ORAL | Status: DC
  Administered 2022-10-10: 40 mg via ORAL
  Filled 2022-10-09: qty 1

## 2022-10-09 MED ORDER — DEXTROSE 50 % IV SOLN
INTRAVENOUS | Status: AC
  Administered 2022-10-09: 25 g via INTRAVENOUS
  Filled 2022-10-09: qty 50

## 2022-10-09 MED ORDER — LORAZEPAM 2 MG/ML IJ SOLN
0.5000 mg | Freq: Once | INTRAMUSCULAR | Status: AC
  Administered 2022-10-09: 0.5 mg via INTRAVENOUS
  Filled 2022-10-09: qty 1

## 2022-10-09 MED ORDER — FUROSEMIDE 40 MG PO TABS
40.0000 mg | ORAL_TABLET | Freq: Every day | ORAL | Status: DC
  Administered 2022-10-10: 40 mg via ORAL
  Filled 2022-10-09: qty 1

## 2022-10-09 MED ORDER — ONDANSETRON HCL 4 MG PO TABS: 4.0000 mg | ORAL_TABLET | Freq: Four times a day (QID) | ORAL | Status: DC | PRN

## 2022-10-09 MED ORDER — ACETAMINOPHEN 650 MG RE SUPP: 650.0000 mg | Freq: Four times a day (QID) | RECTAL | Status: DC | PRN

## 2022-10-09 MED ORDER — ONDANSETRON HCL 4 MG/2ML IJ SOLN: 4.0000 mg | Freq: Four times a day (QID) | INTRAMUSCULAR | Status: DC | PRN

## 2022-10-09 MED ORDER — ACETAMINOPHEN 325 MG PO TABS: 650.0000 mg | ORAL_TABLET | Freq: Four times a day (QID) | ORAL | Status: DC | PRN

## 2022-10-09 MED ORDER — DEXTROSE 50 % IV SOLN: 25.0000 g | Freq: Once | INTRAVENOUS | Status: AC

## 2022-10-09 MED ORDER — GADOBUTROL 1 MMOL/ML IV SOLN
10.0000 mL | Freq: Once | INTRAVENOUS | Status: AC | PRN
  Administered 2022-10-09: 10 mL via INTRAVENOUS

## 2022-10-09 MED ORDER — RIVAROXABAN 10 MG PO TABS
10.0000 mg | ORAL_TABLET | Freq: Every day | ORAL | Status: DC
  Administered 2022-10-10: 10 mg via ORAL
  Filled 2022-10-09: qty 1

## 2022-10-09 MED ORDER — METOPROLOL SUCCINATE ER 50 MG PO TB24
100.0000 mg | ORAL_TABLET | Freq: Two times a day (BID) | ORAL | Status: DC
  Administered 2022-10-10 (×2): 100 mg via ORAL
  Filled 2022-10-09 (×2): qty 2

## 2022-10-09 MED ORDER — OXYCODONE HCL 5 MG PO TABS
5.0000 mg | ORAL_TABLET | Freq: Three times a day (TID) | ORAL | Status: DC | PRN
  Filled 2022-10-09: qty 1

## 2022-10-09 MED ORDER — SODIUM CHLORIDE 0.9 % IV BOLUS
500.0000 mL | Freq: Once | INTRAVENOUS | Status: AC
  Administered 2022-10-09: 500 mL via INTRAVENOUS

## 2022-10-09 MED ORDER — DEXTROSE 10 % IV SOLN: Freq: Once | INTRAVENOUS | Status: AC

## 2022-10-09 MED ORDER — POTASSIUM CHLORIDE CRYS ER 20 MEQ PO TBCR
40.0000 meq | EXTENDED_RELEASE_TABLET | ORAL | Status: AC
  Administered 2022-10-09 – 2022-10-10 (×2): 40 meq via ORAL
  Filled 2022-10-09 (×2): qty 2

## 2022-10-09 NOTE — ED Triage Notes (Signed)
Pt presents to ED via [facility], pt was last seen by husband last night, found this am by husband unresponsive with snoring respirations, CBG 30, given D50 per IO unable to get IV access.  Pt became alert and was able to walk to ambulance then went unresponsive again, pt recently started on pain meds, administered narcan 1 without + response. Pt responses to voice.

## 2022-10-09 NOTE — Assessment & Plan Note (Signed)
Appears stable and compensated. -Rule out hepatic encephalopathy with ammonia level

## 2022-10-09 NOTE — Assessment & Plan Note (Addendum)
Currently in sinus rhythm.    - Xarelto, metoprolol

## 2022-10-09 NOTE — H&P (Addendum)
History and Physical    [name]:[id number] DOB: 1945-12-01 DOA: 10/09/2022  PCP: Tammi Sou, MD   Patient coming from: Home  I have personally briefly reviewed patient's old medical records in [place]  Chief Complaint: AMS  HPI: [name] is a 77 y.o. female with medical history significant for NASH liver cirrhosis, chronic anticoagulation, atrial fibrillation, ulcerative colitis, diabetes mellitus, hypertension. Patient was brought to the ED via EMS with reports of unresponsiveness.  At the time of my evaluation, patient is awake alert and oriented x 3.  She is able to answer questions appropriately.  Spouse is at bedside.  Patient reports pain that started around Christmas.  She was prescribed tramadol, then Lortab, and then was in the ER again and was prescribed oxycodone as the oral medications did not help.  She reports she took just 1 tablet of oxycodone last night, and 3 tablets total yesterday as prescribed. She was last seen normal last night, this morning she would not wake up, and this afternoon, unresponsiveness, husband had to call EMS.  Patient reports she took 120 mg of NovoLog as she normally does.  But she did not eat this morning as she was unresponsive.  She reports urinary incontinence since around Christmas and intermittent fecal incontinence.  She has weakness of her lower extremities, uses a cane to ambulate, but she reports she is not able to ambulate as well as she used to.  She has had back surgery in the past, with intermittent episodes of recurring back pain that she is normally able to manage .  EMS reports normal respirations, CBG of 30, she was given D50 IV.  She became alert, was able to walk to the ambulance then became unresponsive again.  1 mg of Narcan was given without response.  ED Course: Hypothermic- temperature 95, heart rate 66-72.  Respirate rate 19-23.  Blood pressure systolic 818E to 993Z.  O2 sats greater than 92% on  room air. UDS positive for opiates. Chest x-ray clear. ABG shows pH of 7.38, pCO2 of 48. Troponin 3 > 4. Lactic acid 1.9. Potassium 3. Mild elevation in liver enzymes Chest x-ray left greater than right basilar atelectasis. Head CT negative for acute abnormality. 500 bolus given, blood glucose down to 66, 25 g D50 given and 48ms D10 given.  Review of Systems: As per HPI all other systems reviewed and negative.  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 ? to get liver bx if she gets bariatric surgery? Mild portal hypertensive gastropathy on EGD 08/2019.   Dysrhythmia    Fibromyalgia    GAD (generalized anxiety disorder)    GERD    History of hiatal hernia    History of iron deficiency anemia 12/2018   Inadequate absorption secondary to chronic/long term PPI therapy + portal hypertensive gastroduodenopathy. No GIB found on EGD, colonosc, and givens. Iron infusions X multiple.   History of thrombocytopenia 12/2011   Hyperlipidemia    Intolerant of statins   HYPERTENSION    IBS (irritable bowel syndrome)    -D.  Good response to bentyl and imodium as of 06/2018 GI f/u.   IDDM (insulin dependent diabetes mellitus)    with DPN (managed by Dr. GCruzita Ledererbut then in 2018 pt preferred to have me manage for her convenience)   Limited mobility  Requires a walker for arthritic pain, widespread musculoskeletal pain, and neuropathic pain.   Morbid obesity ([place])    As of 11/2018, pt considering sleeve gastectomy vs bipass as of eval by Dr. De Burrs considering as of 12/2019.   Nonalcoholic steatohepatitis    Viral Hep screens NEG.  CT 2015.  Transaminasemia.  U/S 07/2018 showed early changes of cirrhosis.   OSA (obstructive sleep apnea) 09/14/2015   sleep study 09/07/15: severe obstructive sleep apnea with an AHI of 72 and SaO2 low of  75%.>referred to sleep MD   Osteoarthritis    hips, shoulders, knees   PAF (paroxysmal atrial fibrillation) ([place])    One documented episode (after getting EGD 2016).  Was on amiodarone x 3 mo.  Rate control with metoprolol + anticoag with xarelto.    Recurrent epistaxis    Granuloma in L nare cauterized by ENT 04/2020. Another cautery 06/2020   Small fiber neuropathy    Due to DM.  Symmetric hands and feet tingling/numbness.   Ulcerative colitis ([place])    Remicade infusion Q 8 weeks: in clinical and endoscopic remission as of 12/2018 GI f/u.  06/11/19 rpt colonoscopy->cecal and ascending colon colitis.    Past Surgical History:  Procedure Laterality Date   ABDOMINAL HYSTERECTOMY  1980   Paps no longer indicated.   BACK SURGERY     BACTERIAL OVERGROWTH TEST N/A 07/13/2015   Procedure: BACTERIAL OVERGROWTH TEST;  Surgeon: Rogene Houston, MD;  [facility];  Service: Endoscopy;  Laterality: N/A;  730     BILATERAL SALPINGOOPHORECTOMY  02/10/2001   BIOPSY  06/11/2019   Procedure: BIOPSY;  Surgeon: Rogene Houston, MD;  [facility];  Service: Endoscopy;;  colon   BIOPSY  09/27/2019   Procedure: BIOPSY;  Surgeon: Rogene Houston, MD;  [facility];  Service: Endoscopy;;  gastric duodenal   BIOPSY  09/15/2020   Procedure: BIOPSY;  Surgeon: Harvel Quale, MD;  [facility];  Service: Gastroenterology;;   BREAST REDUCTION SURGERY  1994   bilat   BREAST SURGERY     CARDIOVASCULAR STRESS TEST  07/2010   Lexiscan myoview: normal   CARPAL TUNNEL RELEASE Right 1996   CARPAL TUNNEL RELEASE Left 03/21/2003   CARPAL TUNNEL RELEASE Right 05/04/2013   Procedure: CARPAL TUNNEL RELEASE;  Surgeon: Cammie Sickle., MD;  Location: [place];  Service: Orthopedics;  Laterality: Right;   CARPAL TUNNEL RELEASE Left 09/21/2013   Procedure: LEFT CARPAL TUNNEL RELEASE;  Surgeon: Cammie Sickle., MD;  Location: [place];  Service: Orthopedics;  Laterality: Left;   CHOLECYSTECTOMY     COLONOSCOPY WITH PROPOFOL N/A 08/04/2015   Colitis in remission.  No polyps.  Procedure: COLONOSCOPY WITH PROPOFOL;  Surgeon: Rogene Houston, MD;  [facility];  Service: Endoscopy;  Laterality: N/A;  cecum time in  0820   time out  0827    total time 7 minutes   COLONOSCOPY WITH PROPOFOL N/A 06/11/2019   cecal and ascending colon colitis.  Procedure: COLONOSCOPY WITH PROPOFOL;  Surgeon: Rogene Houston, MD;  [facility];  Service: Endoscopy;  Laterality: N/A;  730a   COLONOSCOPY WITH PROPOFOL N/A 09/15/2020   Procedure: COLONOSCOPY WITH PROPOFOL;  Surgeon: Harvel Quale, MD;  [facility];  Service: Gastroenterology;  Laterality: N/A;  1030   ESOPHAGEAL DILATION N/A 08/04/2015   Procedure: ESOPHAGEAL DILATION;  Surgeon: Rogene Houston, MD;  [facility];  Service: Endoscopy;  Laterality: N/AKelvin Cellar, no mucousal disruption   ESOPHAGOGASTRODUODENOSCOPY  09/27/2019   Performed for IDA.  Esoph dilation was done but no stricture present.  Mild portal hypertensive gastropathy, o/w normal.  Duodenal bx NEG.  h pylori neg.   ESOPHAGOGASTRODUODENOSCOPY (EGD) WITH ESOPHAGEAL DILATION  12/02/2005   ESOPHAGOGASTRODUODENOSCOPY (EGD) WITH PROPOFOL N/A 08/04/2015   Procedure: ESOPHAGOGASTRODUODENOSCOPY (EGD) WITH PROPOFOL;  Surgeon: Rogene Houston, MD;  [facility];  Service: Endoscopy;  Laterality: N/A;  procedure 1   ESOPHAGOGASTRODUODENOSCOPY (EGD) WITH PROPOFOL N/A 09/27/2019   Procedure: ESOPHAGOGASTRODUODENOSCOPY (EGD) WITH PROPOFOL;  Surgeon: Rogene Houston, MD;  [facility];  Service: Endoscopy;  Laterality: N/A;  12:10   ESOPHAGOGASTRODUODENOSCOPY (EGD) WITH PROPOFOL N/A 10/31/2021   Procedure: ESOPHAGOGASTRODUODENOSCOPY (EGD) WITH PROPOFOL;  Surgeon: Rogene Houston, MD;  [facility];  Service: Endoscopy;  Laterality: N/A;  [place]  01/17/2012   Procedure: FLEXIBLE SIGMOIDOSCOPY;  Surgeon: Rogene Houston, MD;  [facility];  Service: Endoscopy;  Laterality: N/A;   GIVENS CAPSULE STUDY N/A 08/03/2019   Procedure: GIVENS CAPSULE STUDY (performed for IDA)->some food debris in stomach and small amount of blood.  Surgeon: Rogene Houston, MD;  [facility];  Service: Endoscopy;  Laterality: N/A;  730AM   HEMILAMINOTOMY LUMBAR SPINE Bilateral 09/07/1999   L4-5   KNEE ARTHROSCOPY Right 01/1999; 10/2000   LYSIS OF ADHESION  02/10/2001   MALONEY DILATION  09/27/2019   Procedure: Venia Minks DILATION;  Surgeon: Rogene Houston, MD;  [facility];  Service: Endoscopy;;   NASAL ENDOSCOPY WITH EPISTAXIS CONTROL Bilateral 01/25/2022   Procedure: NASAL ENDOSCOPY WITH EPISTAXIS CONTROL;  Surgeon: Jerrell Belfast, MD;  Location: [place];  Service: ENT;  Laterality: Bilateral;   NASAL SEPTOPLASTY W/ TURBINOPLASTY Bilateral 01/25/2022   Procedure: NASAL SEPTOPLASTY WITH TURBINATE REDUCTION;  Surgeon: Jerrell Belfast, MD;  Location: [place];  Service: ENT;  Laterality: Bilateral;   [place]; 09/12/2006   TARSAL TUNNEL RELEASE  2002   TRANSTHORACIC ECHOCARDIOGRAM  08/04/2015   EF 60-65%, normal wall motion, mild LVH, mild LA dilation, grd I DD.   TUMOR EXCISION Left 03/21/2003   dorsal 1st web space (hand)   URETEROLYSIS Right 02/10/2001     reports that she has never smoked. She has never used smokeless tobacco. She reports that she does not currently use alcohol. She reports that she does not use drugs.  Allergies  Allergen Reactions   Omeprazole Anaphylaxis and Swelling    SWELLING OF TONGUE AND THROAT   Actos [Pioglitazone] Swelling and Other (See Comments)    Weight gain, tongue swelling    Benzocaine-Menthol Swelling    SWELLING OF MOUTH   Colesevelam Other (See Comments)    GI UPSET   Flagyl [Metronidazole Hcl] Other (See Comments)    DIAPHORESIS   Metformin And  Related Diarrhea   Shrimp [Shellfish Allergy] Itching    OF THROAT AND EARS, if consumed raw   Statins Palpitations   Allevyn Adhesive [Wound Dressings]     Other reaction(s): Unknown   Desipramine Hcl Itching, Nausea Only and Other (See Comments)    "swimmy" headed, ears itched    Hydromorphone Itching   Jardiance [Empagliflozin] Other (See Comments)    weakness   Lactose Intolerance (Gi) Diarrhea    Gas, bloating   Adhesive [Tape] Other (See Comments)    SKIN IRRITATION AND BRUISING  Nisoldipine Itching   Percocet [Oxycodone-Acetaminophen] Itching    Family History  Problem Relation Age of Onset   Diabetes Mother    Hypertension Mother    Heart attack Father        Mid 76's   Heart disease Father    Lung disease Father        spot on lung; had lung surgery   Alcohol abuse Other    Hypertension Son    Diabetes Son    Emphysema Maternal Grandfather    Asthma Maternal Grandfather    Colon cancer Paternal Grandfather    Stomach cancer Paternal Grandmother    Neuropathy Neg Hx    Prior to Admission medications   Medication Sig Start Date End Date Taking? Authorizing Provider  acetaminophen (TYLENOL) 325 MG tablet Take 650 mg by mouth every 6 (six) hours as needed for moderate pain or headache.   Yes [provider]  citalopram (CELEXA) 20 MG tablet TAKE 1 TABLET BY MOUTH EVERY DAY Patient taking differently: Take 20 mg by mouth at bedtime. 09/25/21  Yes McGowen, Adrian Blackwater, MD  dicyclomine (BENTYL) 10 MG capsule TAKE 1 CAPSULE (10 MG TOTAL) BY MOUTH EVERY 8 (EIGHT) HOURS AS NEEDED FOR SPASMS (ABDOMINAL PAIN). 09/20/22  Yes Harvel Quale, MD  Dulaglutide (TRULICITY) 1.5 TI/1.4ER SOPN INJECT 1.'5MG'$  ONCE WEEKLY 09/04/22  Yes McGowen, Adrian Blackwater, MD  esomeprazole (NEXIUM) 20 MG capsule TAKE 1 CAPSULE BY MOUTH DAILY BEFORE BREAKFAST 12/04/21  Yes Rehman, Mechele Dawley, MD  furosemide (LASIX) 40 MG tablet TAKE 1 TABLET BY MOUTH EVERY DAY 10/07/22  Yes McGowen, Adrian Blackwater, MD   gabapentin (NEURONTIN) 600 MG tablet TAKE 1 AND 1/2 TABLETS BY MOUTH TWICE A DAY Patient taking differently: Take 1.5 tablets by mouth 2 (two) times daily. TAKE 1 AND 1/2 TABLETS BY MOUTH TWICE A DAY 10/01/22  Yes McGowen, Adrian Blackwater, MD  INFLIXIMAB IV Inject into the vein every 8 (eight) weeks. Remicaid '5mg'$ /kg Infusion every 8 weeks [facility])   Yes [provider]  insulin regular human CONCENTRATED (HUMULIN R U-500 KWIKPEN) 500 UNIT/ML KwikPen DIAL AND INJECT 110 UNITS UNDER THE SKIN WITH BREAKFAST AND DINNER AND 50 UNITS IN EVENING. 10/03/22  Yes McGowen, Adrian Blackwater, MD  INVOKANA 100 MG TABS tablet TAKE 1 TABLET BY MOUTH EVERY DAY BEFORE BREAKFAST 09/05/22  Yes McGowen, Adrian Blackwater, MD  lactase (LACTAID) 3000 UNITS tablet Take 3,000 Units by mouth as needed (when eating foods containing dairy).    Yes [provider]  loperamide (IMODIUM) 2 MG capsule Take 1 capsule (2 mg total) by mouth 2 (two) times daily as needed for diarrhea or loose stools. 04/10/21  Yes Rehman, Mechele Dawley, MD  metoprolol succinate (TOPROL-XL) 100 MG 24 hr tablet TAKE 1 TABLET BY MOUTH 2 TIMES DAILY. TAKE WITH OR IMMEDIATELY FOLLOWING A MEAL 09/25/21  Yes McGowen, Adrian Blackwater, MD  oxyCODONE (OXY IR/ROXICODONE) 5 MG immediate release tablet 1-2 tabs po q6h prn 10/04/22  Yes McGowen, Adrian Blackwater, MD  potassium chloride (KLOR-CON M10) 10 MEQ tablet Take 1 tablet (10 mEq total) by mouth daily. 02/20/22  Yes McGowen, Adrian Blackwater, MD  XARELTO 10 MG TABS tablet TAKE 1 TABLET BY MOUTH EVERY DAY 10/01/22  Yes McGowen, Adrian Blackwater, MD  Accu-Chek Softclix Lancets lancets USE TO CHECK BLOOD GLUCOSE UP TO 6 TIMES DAILY AS DIRECTED 10/01/22   McGowen, Adrian Blackwater, MD  amitriptyline (ELAVIL) 25 MG tablet Take 25 mg by mouth in the morning and  at bedtime. Patient not taking: Reported on 10/09/2022    [provider]  blood glucose meter kit and supplies KIT Use up to six times daily as directed. DX. E11.9 01/05/20   McGowen, Adrian Blackwater,  MD  ferric carboxymaltose (INJECTAFER) 750 MG/15ML SOLN injection Inject 15 mLs (750 mg total) into the vein as directed for 1 dose. Repeat in 7 days Patient not taking: Reported on 10/04/2022 02/27/21   Tammi Sou, MD  glucose blood (ACCU-CHEK GUIDE) test strip USE TO CHECK BLOOD GLUCOSE UP TO 6 TIMES DAILY AS DIRECTED 02/20/22   McGowen, Adrian Blackwater, MD  Insulin Pen Needle (B-D UF III MINI PEN NEEDLES) 31G X 5 MM MISC USE TO INJECT INSULINS EQUAL TO 6 TIMES DAILY. 02/20/22   McGowen, Adrian Blackwater, MD  meclizine (ANTIVERT) 25 MG tablet Take 1 tablet (25 mg total) by mouth 3 (three) times daily as needed for dizziness or nausea. Patient not taking: Reported on 10/09/2022 12/07/15   Binnie Rail, MD  methocarbamol (ROBAXIN) 500 MG tablet Take 1 tablet (500 mg total) by mouth 3 (three) times daily. May cause drowsiness Patient not taking: Reported on 10/09/2022 09/27/22   Kem Parkinson, PA-C  Pediatric Multivitamins-Iron [facility] COMPLETE) 18 MG CHEW Chew 1 tablet by mouth 2 (two) times daily. Patient not taking: Reported on 10/09/2022 04/10/21   Rogene Houston, MD  bromocriptine (PARLODEL) 2.5 MG tablet Take 2.5 mg by mouth 2 (two) times daily.  12/05/11  [provider]  potassium chloride (K-DUR) 10 MEQ tablet Take 2 tablets (20 mEq total) by mouth daily. 02/17/13 08/18/20  Rowe Clack, MD    Physical Exam: Vitals:   10/09/22 1325 10/09/22 1330 10/09/22 1403 10/09/22 1415  BP:  (!) 156/72 (!) 149/88 (!) 129/111  Pulse:  66 70 71  Resp:  (!) 23 19 (!) 22  Temp: (!) 95 F (35 C)     TempSrc: Rectal     SpO2:  95% 92% 95%  Weight:      Height:        Constitutional: , calm, comfortable Vitals:   10/09/22 1325 10/09/22 1330 10/09/22 1403 10/09/22 1415  BP:  (!) 156/72 (!) 149/88 (!) 129/111  Pulse:  66 70 71  Resp:  (!) 23 19 (!) 22  Temp: (!) 95 F (35 C)     TempSrc: Rectal     SpO2:  95% 92% 95%  Weight:      Height:       Eyes: PERRL, lids and conjunctivae  normal ENMT: Mucous membranes are moist. Neck: normal, supple, no masses, no thyromegaly Respiratory: clear to auscultation bilaterally, no wheezing, no crackles. Normal respiratory effort. No accessory muscle use.  Cardiovascular: Regular rate and rhythm, no murmurs / rubs / gallops. No extremity edema.  Extremities warm. Abdomen: no tenderness, no masses palpated. No hepatosplenomegaly. Bowel sounds positive.  Musculoskeletal: no clubbing / cyanosis. No joint deformity upper and lower extremities.  Skin: no rashes, lesions, ulcers. No induration Neurologic: No facial asymmetry, speech clear without evidence of aphasia, sensation intact globally, 5/+5 strength in bilateral upper extremities, 4/5 strength bilateral lower extremity, she is barely able to lift her legs against gravity.   Psychiatric: Normal judgment and insight. Alert and oriented x 3. Normal mood.   Labs on Admission: I have personally reviewed following labs and imaging studies  CBC: Recent Labs  Lab 10/09/22 1347  WBC 5.1  NEUTROABS 3.3  HGB 13.8  HCT 40.5  MCV 99.5  PLT 627*   Basic Metabolic Panel: Recent Labs  Lab 10/09/22 1347 10/09/22 1349  NA 137  --   K 3.0*  --   CL 105  --   CO2 26  --   GLUCOSE 100*  --   BUN 14  --   CREATININE 0.58  --   CALCIUM 8.1*  --   MG  --  2.2   GFR: Estimated Creatinine Clearance: 67 mL/min (by C-G formula based on SCr of 0.58 mg/dL). Liver Function Tests: Recent Labs  Lab 10/09/22 1347  AST 69*  ALT 47*  ALKPHOS 142*  BILITOT 0.9  PROT 7.4  ALBUMIN 3.0*   CBG: Recent Labs  Lab 10/09/22 1321 10/09/22 1520  GLUCAP 119* 66*    Radiological Exams on Admission: CT Head Wo Contrast  Result Date: 10/09/2022 CLINICAL DATA:  Mental status change unknown cause EXAM: CT HEAD WITHOUT CONTRAST TECHNIQUE: Contiguous axial images were obtained from the base of the skull through the vertex without intravenous contrast. RADIATION DOSE REDUCTION: This exam was  performed according to the departmental dose-optimization program which includes automated exposure control, adjustment of the mA and/or kV according to patient size and/or use of iterative reconstruction technique. COMPARISON:  CT head dated March 05, 2011 FINDINGS: Brain: No evidence of acute infarction, hemorrhage, hydrocephalus, extra-axial collection or mass lesion/mass effect. Mild generalized cerebral atrophy and chronic microvascular ischemic changes of the white matter Vascular: No hyperdense vessel or unexpected calcification. Skull: Normal. Negative for fracture or focal lesion. Sinuses/Orbits: Mucous retention cysts in the right maxillary sinus. Remaining paranasal sinuses and mastoid air cells are clear. Other: None. IMPRESSION: 1. No acute intracranial abnormality. 2. Mild generalized cerebral atrophy and chronic microvascular ischemic changes of the white matter. Electronically Signed   By: Keane Police D.O.   On: 10/09/2022 14:59   DG Chest Portable 1 View  Result Date: 10/09/2022 CLINICAL DATA:  Altered mental status.  Hypothermia. EXAM: PORTABLE CHEST 1 VIEW COMPARISON:  Radiographs 09/12/2006. FINDINGS: 1404 hours. Lower lung volumes with probable resulting left greater than right basilar atelectasis. No consolidation, significant pleural effusion or pneumothorax identified. Stable mild cardiac enlargement. No acute osseous findings are evident. There are degenerative changes in the thoracic spine. Numerous telemetry leads overlie the chest. IMPRESSION: Lower lung volumes with probable resulting left greater than right basilar atelectasis. No other significant changes. Electronically Signed   By: Richardean Sale M.D.   On: 10/09/2022 14:13    EKG: Independently reviewed.  Sinus rhythm, QTc 506.  Non-specific T wave abnormalities which are unchanged from prior.   Assessment/Plan Principal Problem:   Acute metabolic encephalopathy Active Problems:   Back pain   Hypoglycemia   Essential  hypertension   Ulcerative colitis (HCC)   Atrial fibrillation with RVR (HCC)   Paroxysmal atrial fibrillation (HCC)   Chronic anticoagulation   OSA (obstructive sleep apnea), severe   Cirrhosis, nonalcoholic (HCC)   Diabetes mellitus ([place])  Assessment and Plan: * Acute metabolic encephalopathy Patient initially unresponsive, blood glucose 30 per EMS- given D50, with initial improvement.  Mental status has improved.  Hypothermic - 95, otherwise stable vitals, blood sugar down to 66.  Head CT negative for acute abnormality.  Denies excessive use of oxycodone.  Rules out for sepsis.  UA, chest x-ray not suggestive of infection.  At this time low suspicion for infectious etiology.  No response to '1mg'$  Narcan per EMS, doubt toxic etiology.  Encephalopathy likely secondary to hypoglycemia with resultant hypothermia.  Due to  large dose insulin last night and not eating this morning. Also initial hypothermia causing hypoglycemia, versus infectious etiology. -ABG shows pH of 7.38, pCO2 of 48. -Monitor CBG closely -Follow-up ammonia level -Check TSH -Check procalcitonin -Hold off on antibiotics at this time   Hypoglycemia CBG- 30 per EMS, 66 in ED.  D50 given, and 16ms D10 given.  History of diabetes controlled A1c 6.1.  Hypoglycemia likely because patient took ~120 units of insulin last night (her normal dose), but did not eat this morning, due to AMS. -At home, humulin 120 units 3 times daily -Monitor CBG closely -Hold Trulicity, Invokana  Back pain History of back surgery with intermittent chronic pains, significantly worse over the past 2 weeks.  Also reports urinary incontinence and intermittent fecal incontinence, difficulty walking. -Seen MRI lumbosacral spine -Cautious with narcotics  Cirrhosis, nonalcoholic (HCC) Appears stable and compensated. -Rule out hepatic encephalopathy with ammonia level  OSA (obstructive sleep apnea), severe - CPAP nightly  Chronic  anticoagulation Resume Xarelto  Paroxysmal atrial fibrillation (HCC) Currently in sinus rhythm.    - Xarelto, metoprolol  [place] Stable.  On infliximab.  Essential hypertension Stable. -Resume metoprolol,    DVT prophylaxis: Xarelto Code Status: Full code Family Communication: Spouse at bedside Disposition Plan: ~ 2 days Consults called:  None Admission status: Obs, stepdown    Author: EBethena Roys MD 10/09/2022 6:21 PM  For on call review [email or web address]

## 2022-10-09 NOTE — Assessment & Plan Note (Addendum)
Patient initially unresponsive, blood glucose 30 per EMS- given D50, with initial improvement.  Mental status has improved.  Hypothermic - 95, otherwise stable vitals, blood sugar down to 66.  Head CT negative for acute abnormality.  Denies excessive use of oxycodone.  Rules out for sepsis.  UA, chest x-ray not suggestive of infection.  At this time low suspicion for infectious etiology.  No response to '1mg'$  Narcan per EMS, doubt toxic etiology.  Encephalopathy likely secondary to hypoglycemia with resultant hypothermia.  Due to large dose insulin last night and not eating this morning. Also initial hypothermia causing hypoglycemia, versus infectious etiology. -ABG shows pH of 7.38, pCO2 of 48. -Monitor CBG closely -Follow-up ammonia level -Check TSH -Check procalcitonin -Hold off on antibiotics at this time

## 2022-10-09 NOTE — Assessment & Plan Note (Signed)
Resume Xarelto 

## 2022-10-09 NOTE — Assessment & Plan Note (Signed)
History of back surgery with intermittent chronic pains, significantly worse over the past 2 weeks.  Also reports urinary incontinence and intermittent fecal incontinence, difficulty walking. -Seen MRI lumbosacral spine -Cautious with narcotics

## 2022-10-09 NOTE — Assessment & Plan Note (Signed)
Stable. -Resume metoprolol,  

## 2022-10-09 NOTE — ED Notes (Signed)
Patient transported to CT 

## 2022-10-09 NOTE — ED Notes (Signed)
Pt up to bathroom in wheelchair and pt going to MRI

## 2022-10-09 NOTE — ED Provider Notes (Signed)
[name] Provider Note   CSN: [id number] Arrival date & time: 10/09/22  1314     History  Chief Complaint  Patient presents with   [name] is a 77 y.o. female.  Pt is a 76 yo with a pmhx significant for gerd, oa, htn, dm, ulcerative colitis on remicade, osa, paroxysmal afib (on Xarelto), morbid obesity, hld, nash, anxiety, anemia, and back pain.  Pt has been having back pain for a few weeks.  She was given a rx for tramadol which was not working.  She came to the ED on 1/1 for back pain and was given a rx for lortab.  Pain worsening, so she saw her pcp on 1/5 and was given a rx for oxycodone.  Pt was last seen by her husband around midnight last night normal.  She had still not gotten out of bed this afternoon, so he checked on her and could not get her awake.  EMS was called.  They found her to have a blood sugar of 30.  They could not get a line, so they placed an IO.  Pt given 1 amp d50.  Pt became a little more alert and was able to walk to the ambulance.  She then developed snoring respirations, so she was given 1 mg narcan.  That did not seem to help.  Pt will now respond to painful stimuli, but she is not able to contribute to hx.       Home Medications Prior to Admission medications   Medication Sig Start Date End Date Taking? Authorizing Provider  acetaminophen (TYLENOL) 325 MG tablet Take 650 mg by mouth every 6 (six) hours as needed for moderate pain or headache.   Yes [provider]  citalopram (CELEXA) 20 MG tablet TAKE 1 TABLET BY MOUTH EVERY DAY Patient taking differently: Take 20 mg by mouth at bedtime. 09/25/21  Yes McGowen, Adrian Blackwater, MD  dicyclomine (BENTYL) 10 MG capsule TAKE 1 CAPSULE (10 MG TOTAL) BY MOUTH EVERY 8 (EIGHT) HOURS AS NEEDED FOR SPASMS (ABDOMINAL PAIN). 09/20/22  Yes Harvel Quale, MD  Dulaglutide (TRULICITY) 1.5 HU/7.6LY SOPN INJECT 1.'5MG'$  ONCE WEEKLY 09/04/22  Yes McGowen, Adrian Blackwater, MD   esomeprazole (NEXIUM) 20 MG capsule TAKE 1 CAPSULE BY MOUTH DAILY BEFORE BREAKFAST 12/04/21  Yes Rehman, Mechele Dawley, MD  furosemide (LASIX) 40 MG tablet TAKE 1 TABLET BY MOUTH EVERY DAY 10/07/22  Yes McGowen, Adrian Blackwater, MD  gabapentin (NEURONTIN) 600 MG tablet TAKE 1 AND 1/2 TABLETS BY MOUTH TWICE A DAY Patient taking differently: Take 1.5 tablets by mouth 2 (two) times daily. TAKE 1 AND 1/2 TABLETS BY MOUTH TWICE A DAY 10/01/22  Yes McGowen, Adrian Blackwater, MD  INFLIXIMAB IV Inject into the vein every 8 (eight) weeks. Remicaid '5mg'$ /kg Infusion every 8 weeks [name])   Yes [provider]  insulin regular human CONCENTRATED (HUMULIN R U-500 KWIKPEN) 500 UNIT/ML KwikPen DIAL AND INJECT 110 UNITS UNDER THE SKIN WITH BREAKFAST AND DINNER AND 50 UNITS IN EVENING. 10/03/22  Yes McGowen, Adrian Blackwater, MD  INVOKANA 100 MG TABS tablet TAKE 1 TABLET BY MOUTH EVERY DAY BEFORE BREAKFAST 09/05/22  Yes McGowen, Adrian Blackwater, MD  lactase (LACTAID) 3000 UNITS tablet Take 3,000 Units by mouth as needed (when eating foods containing dairy).    Yes [provider]  loperamide (IMODIUM) 2 MG capsule Take 1 capsule (2 mg total) by mouth 2 (two) times daily as needed for  diarrhea or loose stools. 04/10/21  Yes Rehman, Mechele Dawley, MD  metoprolol succinate (TOPROL-XL) 100 MG 24 hr tablet TAKE 1 TABLET BY MOUTH 2 TIMES DAILY. TAKE WITH OR IMMEDIATELY FOLLOWING A MEAL 09/25/21  Yes McGowen, Adrian Blackwater, MD  oxyCODONE (OXY IR/ROXICODONE) 5 MG immediate release tablet 1-2 tabs po q6h prn 10/04/22  Yes McGowen, Adrian Blackwater, MD  potassium chloride (KLOR-CON M10) 10 MEQ tablet Take 1 tablet (10 mEq total) by mouth daily. 02/20/22  Yes McGowen, Adrian Blackwater, MD  XARELTO 10 MG TABS tablet TAKE 1 TABLET BY MOUTH EVERY DAY 10/01/22  Yes McGowen, Adrian Blackwater, MD  Accu-Chek Softclix Lancets lancets USE TO CHECK BLOOD GLUCOSE UP TO 6 TIMES DAILY AS DIRECTED 10/01/22   McGowen, Adrian Blackwater, MD  amitriptyline (ELAVIL) 25 MG tablet Take 25 mg by mouth in  the morning and at bedtime. Patient not taking: Reported on 10/09/2022    [provider]  blood glucose meter kit and supplies KIT Use up to six times daily as directed. DX. E11.9 01/05/20   McGowen, Adrian Blackwater, MD  ferric carboxymaltose (INJECTAFER) 750 MG/15ML SOLN injection Inject 15 mLs (750 mg total) into the vein as directed for 1 dose. Repeat in 7 days Patient not taking: Reported on 10/04/2022 02/27/21   Tammi Sou, MD  glucose blood (ACCU-CHEK GUIDE) test strip USE TO CHECK BLOOD GLUCOSE UP TO 6 TIMES DAILY AS DIRECTED 02/20/22   McGowen, Adrian Blackwater, MD  Insulin Pen Needle (B-D UF III MINI PEN NEEDLES) 31G X 5 MM MISC USE TO INJECT INSULINS EQUAL TO 6 TIMES DAILY. 02/20/22   McGowen, Adrian Blackwater, MD  meclizine (ANTIVERT) 25 MG tablet Take 1 tablet (25 mg total) by mouth 3 (three) times daily as needed for dizziness or nausea. Patient not taking: Reported on 10/09/2022 12/07/15   Binnie Rail, MD  methocarbamol (ROBAXIN) 500 MG tablet Take 1 tablet (500 mg total) by mouth 3 (three) times daily. May cause drowsiness Patient not taking: Reported on 10/09/2022 09/27/22   Kem Parkinson, PA-C  Pediatric Multivitamins-Iron [employer] [place] [name] COMPLETE) 18 MG CHEW Chew 1 tablet by mouth 2 (two) times daily. Patient not taking: Reported on 10/09/2022 04/10/21   Rogene Houston, MD  bromocriptine (PARLODEL) 2.5 MG tablet Take 2.5 mg by mouth 2 (two) times daily.  12/05/11  [provider]  potassium chloride (K-DUR) 10 MEQ tablet Take 2 tablets (20 mEq total) by mouth daily. 02/17/13 08/18/20  Rowe Clack, MD      Allergies    Omeprazole, Actos [pioglitazone], Benzocaine-menthol, Colesevelam, Flagyl [metronidazole hcl], Metformin and related, Shrimp [shellfish allergy], Statins, Allevyn adhesive [wound dressings], Desipramine hcl, Hydromorphone, Jardiance [empagliflozin], Lactose intolerance (gi), Adhesive [tape], Nisoldipine, and Percocet [oxycodone-acetaminophen]    Review of Systems    Review of Systems  Unable to perform ROS: Mental status change  All other systems reviewed and are negative.   Physical Exam Updated Vital Signs BP (!) 129/111   Pulse 71   Temp (!) 95 F (35 C) (Rectal)   Resp (!) 22   Ht '5\' 2"'$  (1.575 m)   Wt 99.5 kg   SpO2 95%   BMI 40.12 kg/m  Physical Exam Vitals and nursing note reviewed.  Constitutional:      General: She is in acute distress.     Appearance: Normal appearance. She is obese.  HENT:     Head: Normocephalic and atraumatic.     Right Ear: External ear normal.     Left  Ear: External ear normal.     Nose: Nose normal.     Mouth/Throat:     Mouth: Mucous membranes are dry.  Eyes:     Extraocular Movements: Extraocular movements intact.     Conjunctiva/sclera: Conjunctivae normal.     Pupils: Pupils are equal, round, and reactive to light.  Cardiovascular:     Rate and Rhythm: Normal rate and regular rhythm.     Pulses: Normal pulses.     Heart sounds: Normal heart sounds.  Pulmonary:     Effort: Pulmonary effort is normal.     Breath sounds: Normal breath sounds.  Abdominal:     General: Abdomen is flat. Bowel sounds are normal.     Palpations: Abdomen is soft.  Musculoskeletal:        General: Normal range of motion.     Cervical back: Normal range of motion and neck supple.  Skin:    General: Skin is warm.     Capillary Refill: Capillary refill takes less than 2 seconds.  Neurological:     Mental Status: She is disoriented.     Comments: Pt did open her eyes to painful stimuli and sat up so we could take off her shirt.  She was able to tell me her name, but was not oriented to anything else.  She then went back to sleep.   Pt will squeeze both hands and move both legs equally.  Psychiatric:     Comments: Unable to assess     ED Results / Procedures / Treatments   Labs (all labs ordered are listed, but only abnormal results are displayed) Labs Reviewed  CBC WITH DIFFERENTIAL/PLATELET - Abnormal;  Notable for the following components:      Result Value   Platelets 143 (*)    All other components within normal limits  COMPREHENSIVE METABOLIC PANEL - Abnormal; Notable for the following components:   Potassium 3.0 (*)    Glucose, Bld 100 (*)    Calcium 8.1 (*)    Albumin 3.0 (*)    AST 69 (*)    ALT 47 (*)    Alkaline Phosphatase 142 (*)    All other components within normal limits  URINALYSIS, ROUTINE W REFLEX MICROSCOPIC - Abnormal; Notable for the following components:   Glucose, UA >=500 (*)    Protein, ur 30 (*)    Bacteria, UA RARE (*)    All other components within normal limits  BLOOD GAS, ARTERIAL - Abnormal; Notable for the following components:   pO2, Arterial 58 (*)    Bicarbonate 28.7 (*)    Acid-Base Excess 2.1 (*)    All other components within normal limits  RAPID URINE DRUG SCREEN, HOSP PERFORMED - Abnormal; Notable for the following components:   Opiates POSITIVE (*)    All other components within normal limits  ACETAMINOPHEN LEVEL - Abnormal; Notable for the following components:   Acetaminophen (Tylenol), Serum <10 (*)    All other components within normal limits  CBG MONITORING, ED - Abnormal; Notable for the following components:   Glucose-Capillary 119 (*)    All other components within normal limits  CBG MONITORING, ED - Abnormal; Notable for the following components:   Glucose-Capillary 66 (*)    All other components within normal limits  RESP PANEL BY RT-PCR (RSV, FLU A&B, COVID)  [id number]  LACTIC ACID, PLASMA  MAGNESIUM  AMMONIA  TROPONIN I (HIGH SENSITIVITY)  TROPONIN I (HIGH SENSITIVITY)    EKG EKG Interpretation  Date/Time:  Wednesday October 09 2022 13:35:24 EST Ventricular Rate:  67 PR Interval:  181 QRS Duration: 98 QT Interval:  479 QTC Calculation: 506 R Axis:   -34 Text Interpretation: Sinus rhythm Left axis deviation Anterior infarct, age indeterminate Prolonged QT interval No significant change since last tracing Confirmed  by Isla Pence [phone]) on 10/09/2022 2:06:06 PM  Radiology CT Head Wo Contrast  Result Date: 10/09/2022 CLINICAL DATA:  Mental status change unknown cause EXAM: CT HEAD WITHOUT CONTRAST TECHNIQUE: Contiguous axial images were obtained from the base of the skull through the vertex without intravenous contrast. RADIATION DOSE REDUCTION: This exam was performed according to the departmental dose-optimization program which includes automated exposure control, adjustment of the mA and/or kV according to patient size and/or use of iterative reconstruction technique. COMPARISON:  CT head dated March 05, 2011 FINDINGS: Brain: No evidence of acute infarction, hemorrhage, hydrocephalus, extra-axial collection or mass lesion/mass effect. Mild generalized cerebral atrophy and chronic microvascular ischemic changes of the white matter Vascular: No hyperdense vessel or unexpected calcification. Skull: Normal. Negative for fracture or focal lesion. Sinuses/Orbits: Mucous retention cysts in the right maxillary sinus. Remaining paranasal sinuses and mastoid air cells are clear. Other: None. IMPRESSION: 1. No acute intracranial abnormality. 2. Mild generalized cerebral atrophy and chronic microvascular ischemic changes of the white matter. Electronically Signed   By: Keane Police D.O.   On: 10/09/2022 14:59   DG Chest Portable 1 View  Result Date: 10/09/2022 CLINICAL DATA:  Altered mental status.  Hypothermia. EXAM: PORTABLE CHEST 1 VIEW COMPARISON:  Radiographs 09/12/2006. FINDINGS: 1404 hours. Lower lung volumes with probable resulting left greater than right basilar atelectasis. No consolidation, significant pleural effusion or pneumothorax identified. Stable mild cardiac enlargement. No acute osseous findings are evident. There are degenerative changes in the thoracic spine. Numerous telemetry leads overlie the chest. IMPRESSION: Lower lung volumes with probable resulting left greater than right basilar atelectasis. No  other significant changes. Electronically Signed   By: Richardean Sale M.D.   On: 10/09/2022 14:13    Procedures Procedures    Medications Ordered in ED Medications  potassium chloride 10 mEq in 100 mL IVPB (10 mEq Intravenous New Bag/Given 10/09/22 1517)  sodium chloride 0.9 % bolus 500 mL (500 mLs Intravenous New Bag/Given 10/09/22 1517)  dextrose 10 % infusion ( Intravenous New Bag/Given 10/09/22 1540)  dextrose 50 % solution 25 g (25 g Intravenous Given 10/09/22 1529)    ED Course/ Medical Decision Making/ A&P                           Medical Decision Making Amount and/or Complexity of Data Reviewed Labs: ordered. Radiology: ordered.  Risk Prescription drug management. Decision regarding hospitalization.   This patient presents to the ED for concern of ams, this involves an extensive number of treatment options, and is a complaint that carries with it a high risk of complications and morbidity.  The differential diagnosis includes opiate od, hypoglycemia, hypercarbia, electrolyte abn, infection, cva   Co morbidities that complicate the patient evaluation  gerd, oa, htn, dm, ulcerative colitis on remicade, osa, paroxysmal afib (on Xarelto), morbid obesity, hld, nash, anxiety, anemia, and back pain.   Additional history obtained:  Additional history obtained from epic chart review External records from outside source obtained and reviewed including EMS report   Lab Tests:  I Ordered, and personally interpreted labs.  The pertinent results include:  abg with ph 7.38, pco2 48, po2 58 (pt  placed on oxygen); cbc nl other than plt low at 143 (chronic); cmp with k low at 3.0; mild chronic lft elevation; uds with + opiates; ammonia in process at time of admission; covid/flu neg   Imaging Studies ordered:  I ordered imaging studies including cxr and head ct  I independently visualized and interpreted imaging which showed  CXR: Lower lung volumes with probable resulting left  greater than right  basilar atelectasis. No other significant changes.  CT head: No acute intracranial abnormality.  2. Mild generalized cerebral atrophy and chronic microvascular  ischemic changes of the white matter.   I agree with the radiologist interpretation   Cardiac Monitoring:  The patient was maintained on a cardiac monitor.  I personally viewed and interpreted the cardiac monitored which showed an underlying rhythm of: nsr   Medicines ordered and prescription drug management:  I ordered medication including ivfs  for dehydration; d50/d10 for hypoglycemia  Reevaluation of the patient after these medicines showed that the patient improved I have reviewed the patients home medicines and have made adjustments as needed   Test Considered:  ct   Critical Interventions:  glucose   Consultations Obtained:  I requested consultation with the hospitalist (Dr. Denton Brick),  and discussed lab and imaging findings as well as pertinent plan -she will admit   Problem List / ED Course:  Hypokalemia:  pt given iv k Hypothermia:  bair hugger placed Hypoglycemia:   BS back down to the 60s, so pt given D50 and put on a D10 drip. AMS:  possibly due to oxycodone.  Ammonia is also pending.   Hypoxia:  pO2 58, so pt put on 2L oxygen   Reevaluation:  After the interventions noted above, I reevaluated the patient and found that they have :improved   Social Determinants of Health:  Lives at home   Dispostion:  After consideration of the diagnostic results and the patients response to treatment, I feel that the patent would benefit from admission.    CRITICAL CARE Performed by: Isla Pence   Total critical care time: 30 minutes  Critical care time was exclusive of separately billable procedures and treating other patients.  Critical care was necessary to treat or prevent imminent or life-threatening deterioration.  Critical care was time spent personally by me on  the following activities: development of treatment plan with patient and/or surrogate as well as nursing, discussions with consultants, evaluation of patient's response to treatment, examination of patient, obtaining history from patient or surrogate, ordering and performing treatments and interventions, ordering and review of laboratory studies, ordering and review of radiographic studies, pulse oximetry and re-evaluation of patient's condition.         Final Clinical Impression(s) / ED Diagnoses Final diagnoses:  Hypothermia, initial encounter  Hypokalemia  Hypoglycemia  Acute metabolic encephalopathy    Rx / DC Orders ED Discharge Orders     None         Isla Pence, MD 10/09/22 1550

## 2022-10-09 NOTE — Assessment & Plan Note (Signed)
Stable.  On infliximab.

## 2022-10-09 NOTE — Assessment & Plan Note (Addendum)
CBG- 30 per EMS, 66 in ED.  D50 given, and 58ms D10 given.  History of diabetes controlled A1c 6.1.  Hypoglycemia likely because patient took ~120 units of insulin last night (her normal dose), but did not eat this morning, due to AMS. -At home, humulin 120 units 3 times daily -Monitor CBG closely -Hold Trulicity, Invokana

## 2022-10-09 NOTE — ED Notes (Signed)
Patient returned from CT

## 2022-10-09 NOTE — Assessment & Plan Note (Signed)
CPAP nightly

## 2022-10-10 ENCOUNTER — Ambulatory Visit: Payer: Medicare PPO | Admitting: Family Medicine

## 2022-10-10 DIAGNOSIS — G9341 Metabolic encephalopathy: Secondary | ICD-10-CM | POA: Diagnosis not present

## 2022-10-10 LAB — BASIC METABOLIC PANEL
Anion gap: 5 (ref 5–15)
BUN: 13 mg/dL (ref 8–23)
CO2: 24 mmol/L (ref 22–32)
Calcium: 7.7 mg/dL — ABNORMAL LOW (ref 8.9–10.3)
Chloride: 108 mmol/L (ref 98–111)
Creatinine, Ser: 0.58 mg/dL (ref 0.44–1.00)
GFR, Estimated: >60 mL/min (ref >60)
Glucose, Bld: 133 mg/dL — ABNORMAL HIGH (ref 70–99)
Potassium: 3.8 mmol/L (ref 3.5–5.1)
Sodium: 137 mmol/L (ref 135–145)

## 2022-10-10 LAB — GLUCOSE, CAPILLARY
Glucose-Capillary: 111 mg/dL — ABNORMAL HIGH (ref 70–99)
Glucose-Capillary: 133 mg/dL — ABNORMAL HIGH (ref 70–99)
Glucose-Capillary: 140 mg/dL — ABNORMAL HIGH (ref 70–99)
Glucose-Capillary: 144 mg/dL — ABNORMAL HIGH (ref 70–99)

## 2022-10-10 LAB — CBC
HCT: 34.5 % — ABNORMAL LOW (ref 36.0–46.0)
Hemoglobin: 11.8 g/dL — ABNORMAL LOW (ref 12.0–15.0)
MCH: 34.1 pg — ABNORMAL HIGH (ref 26.0–34.0)
MCHC: 34.2 g/dL (ref 30.0–36.0)
MCV: 99.7 fL (ref 80.0–100.0)
Platelets: 119 10*3/uL — ABNORMAL LOW (ref 150–400)
RBC: 3.46 MIL/uL — ABNORMAL LOW (ref 3.87–5.11)
RDW: 13.8 % (ref 11.5–15.5)
WBC: 6.3 10*3/uL (ref 4.0–10.5)
nRBC: 0 % (ref 0.0–0.2)

## 2022-10-10 MED ORDER — HUMULIN R U-500 KWIKPEN 500 UNIT/ML ~~LOC~~ SOPN: PEN_INJECTOR | SUBCUTANEOUS | 0 refills | Status: DC

## 2022-10-10 NOTE — Progress Notes (Signed)
Patient stable and ready to discharge home with husband. Writer removed IV and went over discharge paperwork with husband and patient and both verbalized understanding. Patient having loose BM prior to discharge. Writer assisted patient with getting dressed and transported patient via [place] to car.

## 2022-10-10 NOTE — Discharge Summary (Signed)
Physician Discharge Summary  [name]:[id number] DOB: April 17, 1946 DOA: 10/09/2022  PCP: Tammi Sou, MD  Admit date: 10/09/2022  Discharge date: 10/10/2022  Admitted From:Home  Disposition:  Home  Recommendations for Outpatient Follow-up:  Follow up with PCP in 1-2 weeks Adjusted Humulin R insulin dosing to 100 units from 110 units at breakfast and from 50 units to 45 units at bedtime to allow for some hyperglycemia and avoid further hypoglycemia for now Recommend potentially seeing endocrinology in the near future as needed to help with assistance in management of home insulin Continue home medications as prior and continue to use narcotics cautiously for back pain  Home Health: None  Equipment/Devices: None  Discharge Condition:Stable  CODE STATUS: Full  Diet recommendation: Heart Healthy/carb modified  Brief/Interim Summary: [name] is a 77 y.o. female with medical history significant for NASH liver cirrhosis, chronic anticoagulation, atrial fibrillation, ulcerative colitis, diabetes mellitus, hypertension. Patient was brought to the ED via EMS with reports of unresponsiveness.  She was admitted with acute metabolic encephalopathy in the setting of hypoglycemia and blood glucose was down to 66.  There were no signs of sepsis and head CT was negative for any acute abnormality.  She was also given Narcan by EMS without any response and therefore does not appear to be related to her home narcotic medications.  According to the spouse, patient took her large dose of insulin last night as recommended, but did not awaken in time to have breakfast and was quite altered, prompting the EMS call.  Her blood glucose levels are now improved, procalcitonin is low, and TSH is within normal limits.  Ammonia level is minimally elevated, but there does not appear to be a need for lactulose as mentation is back to baseline according to spouse.  She was noted to also have some mild  hypothermia related to the hypoglycemia which has resolved as well.  She is in stable condition for discharge with adjustments to insulin made above and she will need close follow-up with her PCP to assist with further management.  Discharge Diagnoses:  Principal Problem:   Acute metabolic encephalopathy Active Problems:   Back pain   Hypoglycemia   Essential hypertension   Ulcerative colitis (HCC)   Paroxysmal atrial fibrillation (HCC)   Chronic anticoagulation   OSA (obstructive sleep apnea), severe   Cirrhosis, nonalcoholic ([place])   Diabetes mellitus ([place])  Principal discharge diagnosis: Acute metabolic encephalopathy in the setting of hypoglycemia.  Discharge Instructions  Discharge Instructions     Diet - low sodium heart healthy   Complete by: As directed    Increase activity slowly   Complete by: As directed       Allergies as of 10/10/2022       Reactions   Omeprazole Anaphylaxis, Swelling   SWELLING OF TONGUE AND THROAT   Actos [pioglitazone] Swelling, Other (See Comments)   Weight gain, tongue swelling   Benzocaine-menthol Swelling   SWELLING OF MOUTH   Colesevelam Other (See Comments)   GI UPSET   Flagyl [metronidazole Hcl] Other (See Comments)   DIAPHORESIS   Metformin And Related Diarrhea   Shrimp [shellfish Allergy] Itching   OF THROAT AND EARS, if consumed raw   Statins Palpitations   Allevyn Adhesive [wound Dressings]    Other reaction(s): Unknown   Desipramine Hcl Itching, Nausea Only, Other (See Comments)   "swimmy" headed, ears itched   Hydromorphone Itching   Jardiance [empagliflozin] Other (See Comments)   weakness  Lactose Intolerance (gi) Diarrhea   Gas, bloating   Adhesive [tape] Other (See Comments)   SKIN IRRITATION AND BRUISING   Nisoldipine Itching   Percocet [oxycodone-acetaminophen] Itching        Medication List     TAKE these medications    Accu-Chek Guide test strip Generic drug: glucose blood USE TO CHECK BLOOD  GLUCOSE UP TO 6 TIMES DAILY AS DIRECTED   Accu-Chek Softclix Lancets lancets USE TO CHECK BLOOD GLUCOSE UP TO 6 TIMES DAILY AS DIRECTED   acetaminophen 325 MG tablet Commonly known as: TYLENOL Take 650 mg by mouth every 6 (six) hours as needed for moderate pain or headache.   amitriptyline 25 MG tablet Commonly known as: ELAVIL Take 25 mg by mouth in the morning and at bedtime.   B-D UF III MINI PEN NEEDLES 31G X 5 MM Misc Generic drug: Insulin Pen Needle USE TO INJECT INSULINS EQUAL TO 6 TIMES DAILY.   blood glucose meter kit and supplies Kit Use up to six times daily as directed. DX. E11.9   citalopram 20 MG tablet Commonly known as: CELEXA TAKE 1 TABLET BY MOUTH EVERY DAY What changed: when to take this   dicyclomine 10 MG capsule Commonly known as: BENTYL TAKE 1 CAPSULE (10 MG TOTAL) BY MOUTH EVERY 8 (EIGHT) HOURS AS NEEDED FOR SPASMS (ABDOMINAL PAIN).   esomeprazole 20 MG capsule Commonly known as: NEXIUM TAKE 1 CAPSULE BY MOUTH DAILY BEFORE BREAKFAST   Flintstones Complete 18 MG Chew Chew 1 tablet by mouth 2 (two) times daily.   furosemide 40 MG tablet Commonly known as: LASIX TAKE 1 TABLET BY MOUTH EVERY DAY   gabapentin 600 MG tablet Commonly known as: NEURONTIN TAKE 1 AND 1/2 TABLETS BY MOUTH TWICE A DAY What changed: See the new instructions.   HumuLIN R U-500 KwikPen 500 UNIT/ML KwikPen Generic drug: insulin regular human CONCENTRATED DIAL AND INJECT 100 UNITS UNDER THE SKIN WITH BREAKFAST AND DINNER AND 45 UNITS IN EVENING. What changed: additional instructions   INFLIXIMAB IV Inject into the vein every 8 (eight) weeks. Remicaid '5mg'$ /kg Infusion every 8 weeks [facility])   Injectafer 750 MG/15ML Soln injection Generic drug: ferric carboxymaltose Inject 15 mLs (750 mg total) into the vein as directed for 1 dose. Repeat in 7 days   Invokana 100 MG Tabs tablet Generic drug: canagliflozin TAKE 1 TABLET BY MOUTH EVERY DAY BEFORE  BREAKFAST   lactase 3000 units tablet Commonly known as: LACTAID Take 3,000 Units by mouth as needed (when eating foods containing dairy).   loperamide 2 MG capsule Commonly known as: IMODIUM Take 1 capsule (2 mg total) by mouth 2 (two) times daily as needed for diarrhea or loose stools.   meclizine 25 MG tablet Commonly known as: ANTIVERT Take 1 tablet (25 mg total) by mouth 3 (three) times daily as needed for dizziness or nausea.   methocarbamol 500 MG tablet Commonly known as: ROBAXIN Take 1 tablet (500 mg total) by mouth 3 (three) times daily. May cause drowsiness   metoprolol succinate 100 MG 24 hr tablet Commonly known as: TOPROL-XL TAKE 1 TABLET BY MOUTH 2 TIMES DAILY. TAKE WITH OR IMMEDIATELY FOLLOWING A MEAL   oxyCODONE 5 MG immediate release tablet Commonly known as: Oxy IR/ROXICODONE 1-2 tabs po q6h prn   potassium chloride 10 MEQ tablet Commonly known as: Klor-Con M10 Take 1 tablet (10 mEq total) by mouth daily.   Trulicity 1.5 XN/2.3FT Sopn Generic drug: Dulaglutide INJECT 1.'5MG'$  ONCE WEEKLY  Xarelto 10 MG Tabs tablet Generic drug: rivaroxaban TAKE 1 TABLET BY MOUTH EVERY DAY        Follow-up Information     McGowen, Adrian Blackwater, MD. Schedule an appointment as soon as possible for a visit in 1 week(s).   Specialty: Family Medicine Contact information: [address] [place] [address] [phone]                Allergies  Allergen Reactions   Omeprazole Anaphylaxis and Swelling    SWELLING OF TONGUE AND THROAT   Actos [Pioglitazone] Swelling and Other (See Comments)    Weight gain, tongue swelling    Benzocaine-Menthol Swelling    SWELLING OF MOUTH   Colesevelam Other (See Comments)    GI UPSET   Flagyl [Metronidazole Hcl] Other (See Comments)    DIAPHORESIS   Metformin And Related Diarrhea   Shrimp [Shellfish Allergy] Itching    OF THROAT AND EARS, if consumed raw   Statins Palpitations   Allevyn Adhesive [Wound  Dressings]     Other reaction(s): Unknown   Desipramine Hcl Itching, Nausea Only and Other (See Comments)    "swimmy" headed, ears itched    Hydromorphone Itching   Jardiance [Empagliflozin] Other (See Comments)    weakness   Lactose Intolerance (Gi) Diarrhea    Gas, bloating   Adhesive [Tape] Other (See Comments)    SKIN IRRITATION AND BRUISING   Nisoldipine Itching   Percocet [Oxycodone-Acetaminophen] Itching    Consultations: None   Procedures/Studies: MR Lumbar Spine W Wo Contrast  Result Date: 10/09/2022 CLINICAL DATA:  Low back pain for 2 weeks with urinary incontinence and intermittent fecal incontinence. Chronic lower extremity weakness. EXAM: MRI LUMBAR SPINE WITHOUT AND WITH CONTRAST TECHNIQUE: Multiplanar and multiecho pulse sequences of the lumbar spine were obtained without and with intravenous contrast. CONTRAST:  5m GADAVIST GADOBUTROL 1 MMOL/ML IV SOLN COMPARISON:  Lumbar spine radiographs 09/30/2022, abdominal MRI 01/03/2022 and abdominopelvic CT 06/07/2014. FINDINGS: Despite efforts by the technologist and patient, mild-to-moderate motion artifact is present on today's exam and could not be eliminated. This reduces exam sensitivity and specificity. Segmentation: Conventional anatomy assumed, with the last open disc space designated L5-S1.Concordant with prior imaging. Alignment: Degenerative grade 1 anterolisthesis at L4-5. Otherwise normal. Vertebrae: Mild acute superior endplate compression fracture at L5 with bone marrow edema and enhancement throughout the vertebral body and bilateral pedicles. No osseous retropulsion. No other fractures or suspicious focal lesions are identified. No evidence of discitis or osteomyelitis. The lumbar pedicles are diffusely short on a congenital basis. Conus medullaris: Extends to the L1-2 level and appears normal. No abnormal intradural enhancement. Paraspinal and other soft tissues: Limited by motion. There is periarticular enhancement  associated with the L4-5 facet joints, left greater than right. No focal fluid collections are identified. Disc levels: Sagittal images demonstrate a small central disc protrusion at T11-12 without resulting significant spinal stenosis or nerve root encroachment. Mild disc bulging and facet hypertrophy from T12-L1 through L2-3. No significant spinal stenosis or nerve root encroachment. L3-4: Preserved disc height. 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 L4-5: Suspected remote postsurgical changes on the right. Disc height is preserved. There is annular disc bulging with moderate facet and ligamentous hypertrophy. As above, there is periarticular enhancement without focal fluid collection or definite erosive changes. Moderate spinal stenosis with mild lateral recess and foraminal narrowing bilaterally. L5-S1: Preserved disc height with  disc bulging and a shallow central disc protrusion. Facet and ligamentous hypertrophy bilaterally. Moderate spinal stenosis with moderate lateral recess and mild to moderate foraminal narrowing bilaterally. IMPRESSION: 1. Mild acute superior endplate compression fracture at L5. No osseous retropulsion. 2. No evidence of discitis or osteomyelitis. Periarticular enhancement associated with the L4-5 facet joints, left greater than right, favored to be degenerative/inflammatory rather than infectious. Correlate clinically. 3. Moderate multifactorial spinal stenosis at L4-5 and L5-S1 with mild to moderate foraminal narrowing bilaterally. 4. Mild multifactorial spinal stenosis and left foraminal narrowing at L3-4. Electronically Signed   By: Richardean Sale M.D.   On: 10/09/2022 18:55   MR SACRUM SI JOINTS W WO CONTRAST  Result Date: 10/09/2022 CLINICAL DATA:  Low back pain with urinary and fecal incontinence. Lower extremity weakness. EXAM: MRI SACRUM WITHOUT  CONTRAST TECHNIQUE: Multiplanar multi-sequence MR imaging of the sacrum was performed. No intravenous contrast was administered. COMPARISON:  Lumbar MRI from 10/09/2022 and CT pelvis from 06/07/2014 FINDINGS: Osseous structures: There is evidence of a compression fracture at L5, please see dedicated lumbar spine MRI report. No sacral fracture or significant arthropathy of the SI joints identified. Adjacent iliac bones unremarkable. Sacral plexus: No impinging lesion identified along the sacral plexus or proximal sciatic nerve on either side. Musculotendinous: Unremarkable where included Other: No supplemental non-categorized findings. IMPRESSION: 1. No specific abnormality of the sacrum or SI joints. 2. L5 compression fracture, please see dedicated lumbar spine MRI report. Electronically Signed   By: Van Clines M.D.   On: 10/09/2022 18:51   CT Head Wo Contrast  Result Date: 10/09/2022 CLINICAL DATA:  Mental status change unknown cause EXAM: CT HEAD WITHOUT CONTRAST TECHNIQUE: Contiguous axial images were obtained from the base of the skull through the vertex without intravenous contrast. RADIATION DOSE REDUCTION: This exam was performed according to the departmental dose-optimization program which includes automated exposure control, adjustment of the mA and/or kV according to patient size and/or use of iterative reconstruction technique. COMPARISON:  CT head dated March 05, 2011 FINDINGS: Brain: No evidence of acute infarction, hemorrhage, hydrocephalus, extra-axial collection or mass lesion/mass effect. Mild generalized cerebral atrophy and chronic microvascular ischemic changes of the white matter Vascular: No hyperdense vessel or unexpected calcification. Skull: Normal. Negative for fracture or focal lesion. Sinuses/Orbits: Mucous retention cysts in the right maxillary sinus. Remaining paranasal sinuses and mastoid air cells are clear. Other: None. IMPRESSION: 1. No acute intracranial abnormality. 2.  Mild generalized cerebral atrophy and chronic microvascular ischemic changes of the white matter. Electronically Signed   By: Keane Police D.O.   On: 10/09/2022 14:59   DG Chest Portable 1 View  Result Date: 10/09/2022 CLINICAL DATA:  Altered mental status.  Hypothermia. EXAM: PORTABLE CHEST 1 VIEW COMPARISON:  Radiographs 09/12/2006. FINDINGS: 1404 hours. Lower lung volumes with probable resulting left greater than right basilar atelectasis. No consolidation, significant pleural effusion or pneumothorax identified. Stable mild cardiac enlargement. No acute osseous findings are evident. There are degenerative changes in the thoracic spine. Numerous telemetry leads overlie the chest. IMPRESSION: Lower lung volumes with probable resulting left greater than right basilar atelectasis. No other significant changes. Electronically Signed   By: Richardean Sale M.D.   On: 10/09/2022 14:13   DG Lumbar Spine Complete  Result Date: 09/30/2022 CLINICAL DATA:  Back pain EXAM: LUMBAR SPINE - COMPLETE 4+ VIEW COMPARISON:  CT 06/07/2014 FINDINGS: Lumbar alignment within normal limits. Vertebral body heights are maintained. Mild multilevel degenerative osteophytes. Mild disc space narrowing L1-L2 and L2-L3. Hypertrophic  facet degenerative changes at the lower lumbar spine. IMPRESSION: Mild degenerative changes. No acute osseous abnormality. Electronically Signed   By: Donavan Foil M.D.   On: 09/30/2022 20:25     Discharge Exam: Vitals:   10/10/22 0314 10/10/22 0810  BP: (!) 139/57 (!) 146/49  Pulse: 75 70  Resp: 19 18  Temp: 99.4 F (37.4 C) 97.9 F (36.6 C)  SpO2: 96% 97%   Vitals:   10/09/22 2100 10/09/22 2136 10/10/22 0314 10/10/22 0810  BP: (!) 150/91 (!) 142/71 (!) 139/57 (!) 146/49  Pulse: 78 81 75 70  Resp: '13 19 19 18  '$ Temp:  98.8 F (37.1 C) 99.4 F (37.4 C) 97.9 F (36.6 C)  TempSrc:    Oral  SpO2: 98% 100% 96% 97%  Weight:      Height:        General: Pt is alert, awake, not in acute  distress Cardiovascular: RRR, S1/S2 +, no rubs, no gallops Respiratory: CTA bilaterally, no wheezing, no rhonchi Abdominal: Soft, NT, ND, bowel sounds + Extremities: no edema, no cyanosis    The results of significant diagnostics from this hospitalization (including imaging, microbiology, ancillary and laboratory) are listed below for reference.     Microbiology: Recent Results (from the past 240 hour(s))  Urine Culture     Status: None   Collection Time: 10/04/22 11:07 AM   Specimen: Urine  Result Value Ref Range Status   MICRO NUMBER: [id number]  Final   SPECIMEN QUALITY: Adequate  Final   Sample Source URINE  Final   STATUS: FINAL  Final   Result: No Growth  Final  Resp panel by RT-PCR (RSV, Flu A&B, Covid) Anterior Nasal Swab     Status: None   Collection Time: 10/09/22  1:33 PM   Specimen: Anterior Nasal Swab  Result Value Ref Range Status   SARS Coronavirus 2 by RT PCR NEGATIVE NEGATIVE Final    Comment: (NOTE) SARS-CoV-2 target nucleic acids are NOT DETECTED.  The SARS-CoV-2 RNA is generally detectable in upper respiratory specimens during the acute phase of infection. The lowest concentration of SARS-CoV-2 viral copies this assay can detect is 138 copies/mL. A negative result does not preclude SARS-Cov-2 infection and should not be used as the sole basis for treatment or other patient management decisions. A negative result may occur with  improper specimen collection/handling, submission of specimen other than nasopharyngeal swab, presence of viral mutation(s) within the areas targeted by this assay, and inadequate number of viral copies(<138 copies/mL). A negative result must be combined with clinical observations, patient history, and epidemiological information. The expected result is Negative.  Fact Sheet for Patients:  EntrepreneurPulse.com.au  Fact Sheet for Healthcare Providers:  IncredibleEmployment.be  This test is no  t yet approved or cleared by the [place] FDA and  has been authorized for detection and/or diagnosis of SARS-CoV-2 by FDA under an Emergency Use Authorization (EUA). This EUA will remain  in effect (meaning this test can be used) for the duration of the COVID-19 declaration under Section 564(b)(1) of the Act, 21 U.S.C.section 360bbb-3(b)(1), unless the authorization is terminated  or revoked sooner.       Influenza A by PCR NEGATIVE NEGATIVE Final   Influenza B by PCR NEGATIVE NEGATIVE Final    Comment: (NOTE) The Xpert Xpress SARS-CoV-2/FLU/RSV plus assay is intended as an aid in the diagnosis of influenza from Nasopharyngeal swab specimens and should not be used as a sole basis for treatment. Nasal washings and aspirates are  unacceptable for Xpert Xpress SARS-CoV-2/FLU/RSV testing.  Fact Sheet for Patients: EntrepreneurPulse.com.au  Fact Sheet for Healthcare Providers: IncredibleEmployment.be  This test is not yet approved or cleared by the [place] FDA and has been authorized for detection and/or diagnosis of SARS-CoV-2 by FDA under an Emergency Use Authorization (EUA). This EUA will remain in effect (meaning this test can be used) for the duration of the COVID-19 declaration under Section 564(b)(1) of the Act, 21 U.S.C. section 360bbb-3(b)(1), unless the authorization is terminated or revoked.     Resp Syncytial Virus by PCR NEGATIVE NEGATIVE Final    Comment: (NOTE) Fact Sheet for Patients: EntrepreneurPulse.com.au  Fact Sheet for Healthcare Providers: IncredibleEmployment.be  This test is not yet approved or cleared by the [place] FDA and has been authorized for detection and/or diagnosis of SARS-CoV-2 by FDA under an Emergency Use Authorization (EUA). This EUA will remain in effect (meaning this test can be used) for the duration of the COVID-19 declaration under Section  564(b)(1) of the Act, 21 U.S.C. section 360bbb-3(b)(1), unless the authorization is terminated or revoked.  Performed at [facility], [address], [place], [place] [address]      Labs: BNP (last 3 results) No results for input(s): "BNP" in the last 8760 hours. Basic Metabolic Panel: Recent Labs  Lab 10/09/22 1347 10/09/22 1349 10/10/22 0512  NA 137  --  137  K 3.0*  --  3.8  CL 105  --  108  CO2 26  --  24  GLUCOSE 100*  --  133*  BUN 14  --  13  CREATININE 0.58  --  0.58  CALCIUM 8.1*  --  7.7*  MG  --  2.2  --    Liver Function Tests: Recent Labs  Lab 10/09/22 1347  AST 69*  ALT 47*  ALKPHOS 142*  BILITOT 0.9  PROT 7.4  ALBUMIN 3.0*   No results for input(s): "LIPASE", "AMYLASE" in the last 168 hours. Recent Labs  Lab 10/09/22 1538  AMMONIA 62*   CBC: Recent Labs  Lab 10/09/22 1347 10/10/22 0512  WBC 5.1 6.3  NEUTROABS 3.3  --   HGB 13.8 11.8*  HCT 40.5 34.5*  MCV 99.5 99.7  PLT 143* 119*   Cardiac Enzymes: No results for input(s): "CKTOTAL", "CKMB", "CKMBINDEX", "TROPONINI" in the last 168 hours. BNP: Invalid input(s): "POCBNP" CBG: Recent Labs  Lab 10/09/22 2205 10/10/22 0025 10/10/22 0212 10/10/22 0458 10/10/22 0544  GLUCAP 112* 111* 133* 140* 144*   D-Dimer No results for input(s): "DDIMER" in the last 72 hours. Hgb A1c No results for input(s): "HGBA1C" in the last 72 hours. Lipid Profile No results for input(s): "CHOL", "HDL", "LDLCALC", "TRIG", "CHOLHDL", "LDLDIRECT" in the last 72 hours. Thyroid function studies Recent Labs    10/09/22 1349  TSH 0.618   Anemia work up No results for input(s): "VITAMINB12", "FOLATE", "FERRITIN", "TIBC", "IRON", "RETICCTPCT" in the last 72 hours. Urinalysis    Component Value Date/Time   COLORURINE YELLOW 10/09/2022 1407   APPEARANCEUR CLEAR 10/09/2022 1407   LABSPEC 1.025 10/09/2022 1407   PHURINE 5.0 10/09/2022 1407   GLUCOSEU >=500 (A) 10/09/2022 1407   HGBUR NEGATIVE 10/09/2022  1407   BILIRUBINUR NEGATIVE 10/09/2022 1407   BILIRUBINUR negative 10/04/2022 1159   KETONESUR NEGATIVE 10/09/2022 1407   PROTEINUR 30 (A) 10/09/2022 1407   UROBILINOGEN 1.0 10/04/2022 1159   UROBILINOGEN 0.2 01/11/2012 2225   NITRITE NEGATIVE 10/09/2022 1407   LEUKOCYTESUR NEGATIVE 10/09/2022 1407   Sepsis Labs Recent  Labs  Lab 10/09/22 1347 10/10/22 0512  WBC 5.1 6.3   Microbiology Recent Results (from the past 240 hour(s))  Urine Culture     Status: None   Collection Time: 10/04/22 11:07 AM   Specimen: Urine  Result Value Ref Range Status   MICRO NUMBER: [id number]  Final   SPECIMEN QUALITY: Adequate  Final   Sample Source URINE  Final   STATUS: FINAL  Final   Result: No Growth  Final  Resp panel by RT-PCR (RSV, Flu A&B, Covid) Anterior Nasal Swab     Status: None   Collection Time: 10/09/22  1:33 PM   Specimen: Anterior Nasal Swab  Result Value Ref Range Status   SARS Coronavirus 2 by RT PCR NEGATIVE NEGATIVE Final    Comment: (NOTE) SARS-CoV-2 target nucleic acids are NOT DETECTED.  The SARS-CoV-2 RNA is generally detectable in upper respiratory specimens during the acute phase of infection. The lowest concentration of SARS-CoV-2 viral copies this assay can detect is 138 copies/mL. A negative result does not preclude SARS-Cov-2 infection and should not be used as the sole basis for treatment or other patient management decisions. A negative result may occur with  improper specimen collection/handling, submission of specimen other than nasopharyngeal swab, presence of viral mutation(s) within the areas targeted by this assay, and inadequate number of viral copies(<138 copies/mL). A negative result must be combined with clinical observations, patient history, and epidemiological information. The expected result is Negative.  Fact Sheet for Patients:  EntrepreneurPulse.com.au  Fact Sheet for Healthcare Providers:   IncredibleEmployment.be  This test is no t yet approved or cleared by the [place] FDA and  has been authorized for detection and/or diagnosis of SARS-CoV-2 by FDA under an Emergency Use Authorization (EUA). This EUA will remain  in effect (meaning this test can be used) for the duration of the COVID-19 declaration under Section 564(b)(1) of the Act, 21 U.S.C.section 360bbb-3(b)(1), unless the authorization is terminated  or revoked sooner.       Influenza A by PCR NEGATIVE NEGATIVE Final   Influenza B by PCR NEGATIVE NEGATIVE Final    Comment: (NOTE) The Xpert Xpress SARS-CoV-2/FLU/RSV plus assay is intended as an aid in the diagnosis of influenza from Nasopharyngeal swab specimens and should not be used as a sole basis for treatment. Nasal washings and aspirates are unacceptable for Xpert Xpress SARS-CoV-2/FLU/RSV testing.  Fact Sheet for Patients: EntrepreneurPulse.com.au  Fact Sheet for Healthcare Providers: IncredibleEmployment.be  This test is not yet approved or cleared by the [place] FDA and has been authorized for detection and/or diagnosis of SARS-CoV-2 by FDA under an Emergency Use Authorization (EUA). This EUA will remain in effect (meaning this test can be used) for the duration of the COVID-19 declaration under Section 564(b)(1) of the Act, 21 U.S.C. section 360bbb-3(b)(1), unless the authorization is terminated or revoked.     Resp Syncytial Virus by PCR NEGATIVE NEGATIVE Final    Comment: (NOTE) Fact Sheet for Patients: EntrepreneurPulse.com.au  Fact Sheet for Healthcare Providers: IncredibleEmployment.be  This test is not yet approved or cleared by the [place] FDA and has been authorized for detection and/or diagnosis of SARS-CoV-2 by FDA under an Emergency Use Authorization (EUA). This EUA will remain in effect (meaning this test can be used) for  the duration of the COVID-19 declaration under Section 564(b)(1) of the Act, 21 U.S.C. section 360bbb-3(b)(1), unless the authorization is terminated or revoked.  Performed at [facility], [address] [place], [place] [address]  Time coordinating discharge: 35 minutes  SIGNED:   Rodena Goldmann, DO [facility] 10/10/2022, 9:06 AM  If 7PM-7AM, please contact night-coverage [email or web address]

## 2022-10-10 NOTE — Progress Notes (Signed)
  Transition of Care [facility]) Screening Note   Patient Details  Name: [name] Date of Birth: 09-23-1946   Transition of Care [facility]) CM/SW Contact:    Iona Beard, [place] Phone Number: 10/10/2022, 9:24 AM    Transition of Care Department [facility]) has reviewed patient and no TOC needs have been identified at this time. We will continue to monitor patient advancement through interdisciplinary progression rounds. If new patient transition needs arise, please place a TOC consult.

## 2022-10-15 ENCOUNTER — Telehealth: Payer: Self-pay | Admitting: Family Medicine

## 2022-10-15 ENCOUNTER — Encounter (INDEPENDENT_AMBULATORY_CARE_PROVIDER_SITE_OTHER): Payer: Self-pay | Admitting: *Deleted

## 2022-10-15 NOTE — Telephone Encounter (Signed)
Pt agreed to virtual. Advised appt details will be discussed tomorrow for virtual.

## 2022-10-15 NOTE — Telephone Encounter (Signed)
Would prefer virtual but if phone is the only option then that's fine this time.

## 2022-10-15 NOTE — Telephone Encounter (Signed)
Pt was advised to keep appt for tomorrow. Neurosurgery appt is not until 2/23. She states the pain is actually worse than last visit on 1/5. She wanted to know if the appt could be done by phone. She does not think she will be able to come in.  Please advise if appt can be changed.

## 2022-10-15 NOTE — Telephone Encounter (Signed)
Pt's husband called inquiring if Shronda needs to keep her appointment for tomorrow? I told him someone would give him a call regarding that answer. He is wondering because she has an upcoming appointment scheduled with a neurosurgeon, and unsure who scheduled this appointment. Please give the patient a call with any information regarding upcoming appointment at this time.

## 2022-10-16 ENCOUNTER — Telehealth (INDEPENDENT_AMBULATORY_CARE_PROVIDER_SITE_OTHER): Payer: Medicare PPO | Admitting: Family Medicine

## 2022-10-16 ENCOUNTER — Encounter: Payer: Self-pay | Admitting: Family Medicine

## 2022-10-16 DIAGNOSIS — M545 Low back pain, unspecified: Secondary | ICD-10-CM | POA: Diagnosis not present

## 2022-10-16 DIAGNOSIS — E162 Hypoglycemia, unspecified: Secondary | ICD-10-CM

## 2022-10-16 NOTE — Progress Notes (Signed)
Virtual Visit via Video Note  I connected with [name] on 10/16/22 at  1:00 PM EST by telephone and verified that I am speaking with the correct person using two identifiers.  Location patient: [place] Location provider:work or home office Persons participating in the virtual visit: patient, provider  I discussed the limitations and requested verbal permission for telemedicine visit. The patient expressed understanding and agreed to proceed.  CC: 77 year old female being seen today for 12 day follow-up back pain. A/P as of last visit: "#1 acute low back pain, history of spondylosis, remote history of lumbar laminectomy. No preceding overuse, strain, or trauma. Patient is immunocompromised due to being on Remicade for her ulcerative colitis. She would be unable to participate in PT at this time due to the severity of the pain. Will try to get her pain better controlled with oxycodone 5 mg, 1-2 every 6 hours as needed, #42. Additionally, will check MRI L-spine without contrast to try to see if she has acute degenerative correlate with her pain, make sure no signs of alternative diagnosis such as sacroiliitis or osteomyelitis. Ordered referral to Dr. Lynann Bologna and orthopedics today.   #2 urinary incontinence. Low suspicion of cauda equina syndrome. UA today showed 2+ glucose, otherwise normal. Reassured at this time. Sent urine for culture."  INTERIM HX: She was admitted to the hospital 1/10 to 10/10/2022 for hypoglycemia-induced encephalopathy.  Workup benign.  Insulin doses lowered some. Patient has question of oxycodone as being the possible cause of this recent hypoglycemia so she did not take the medication again.  MRI lumbar spine with and without contrast on 10/09/2022 showed a mild acute superior endplate compression fracture at L5 without osseous retropulsion.  Otherwise there was multilevel mild to moderate multifactorial spinal stenosis.  No findings suggestive of infection. MRI of the  sacrum and SI joints without notable abnormality.  Still in loss of pain, diffusely across the low back and buttocks bilaterally.  Little bit down the backs of her thighs. Taking ibuprofen 800 mg at a time, gets mild short-term relief. She has initial visit with Dr. Saintclair Halsted and neurosurgery in 6 days.  ROS: See pertinent positives and negatives per HPI.  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 ? to get liver bx if she gets bariatric surgery? Mild portal hypertensive gastropathy on EGD 08/2019.   Dysrhythmia    Fibromyalgia    GAD (generalized anxiety disorder)    GERD    History of hiatal hernia    History of iron deficiency anemia 12/2018   Inadequate absorption secondary to chronic/long term PPI therapy + portal hypertensive gastroduodenopathy. No GIB found on EGD, colonosc, and givens. Iron infusions X multiple.   History of thrombocytopenia 12/2011   Hyperlipidemia    Intolerant of statins   HYPERTENSION    IBS (irritable bowel syndrome)    -D.  Good response to bentyl and imodium as of 06/2018 GI f/u.   IDDM (insulin dependent diabetes mellitus)    with DPN (managed by Dr. Cruzita Lederer but then in 2018 pt preferred to have me manage for her convenience)   Limited mobility    Requires a walker for arthritic pain, widespread musculoskeletal pain, and neuropathic pain.   Morbid obesity ([place])    As of 11/2018, pt considering sleeve gastectomy vs bipass as of eval by Dr. De Burrs considering as of 12/2019.  Nonalcoholic steatohepatitis    Viral Hep screens NEG.  CT 2015.  Transaminasemia.  U/S 07/2018 showed early changes of cirrhosis.   OSA (obstructive sleep apnea) 09/14/2015   sleep study 09/07/15: severe obstructive sleep apnea with an AHI of 72 and SaO2 low of 75%.>referred to sleep MD   Osteoarthritis    hips, shoulders,  knees   PAF (paroxysmal atrial fibrillation) ([place])    One documented episode (after getting EGD 2016).  Was on amiodarone x 3 mo.  Rate control with metoprolol + anticoag with xarelto.    Recurrent epistaxis    Granuloma in L nare cauterized by ENT 04/2020. Another cautery 06/2020   Small fiber neuropathy    Due to DM.  Symmetric hands and feet tingling/numbness.   Ulcerative colitis ([place])    Remicade infusion Q 8 weeks: in clinical and endoscopic remission as of 12/2018 GI f/u.  06/11/19 rpt colonoscopy->cecal and ascending colon colitis.    Past Surgical History:  Procedure Laterality Date   ABDOMINAL HYSTERECTOMY  1980   Paps no longer indicated.   BACK SURGERY     BACTERIAL OVERGROWTH TEST N/A 07/13/2015   Procedure: BACTERIAL OVERGROWTH TEST;  Surgeon: Rogene Houston, MD;  [facility];  Service: Endoscopy;  Laterality: N/A;  730     BILATERAL SALPINGOOPHORECTOMY  02/10/2001   BIOPSY  06/11/2019   Procedure: BIOPSY;  Surgeon: Rogene Houston, MD;  [facility];  Service: Endoscopy;;  colon   BIOPSY  09/27/2019   Procedure: BIOPSY;  Surgeon: Rogene Houston, MD;  [facility];  Service: Endoscopy;;  gastric duodenal   BIOPSY  09/15/2020   Procedure: BIOPSY;  Surgeon: Harvel Quale, MD;  [facility];  Service: Gastroenterology;;   BREAST REDUCTION SURGERY  1994   bilat   BREAST SURGERY     CARDIOVASCULAR STRESS TEST  07/2010   Lexiscan myoview: normal   CARPAL TUNNEL RELEASE Right 1996   CARPAL TUNNEL RELEASE Left 03/21/2003   CARPAL TUNNEL RELEASE Right 05/04/2013   Procedure: CARPAL TUNNEL RELEASE;  Surgeon: Cammie Sickle., MD;  Location: [place];  Service: Orthopedics;  Laterality: Right;   CARPAL TUNNEL RELEASE Left 09/21/2013   Procedure: LEFT CARPAL TUNNEL RELEASE;  Surgeon: Cammie Sickle., MD;  Location: [place];  Service: Orthopedics;  Laterality: Left;    CHOLECYSTECTOMY     COLONOSCOPY WITH PROPOFOL N/A 08/04/2015   Colitis in remission.  No polyps.  Procedure: COLONOSCOPY WITH PROPOFOL;  Surgeon: Rogene Houston, MD;  [facility];  Service: Endoscopy;  Laterality: N/A;  cecum time in  0820   time out  0827    total time 7 minutes   COLONOSCOPY WITH PROPOFOL N/A 06/11/2019   cecal and ascending colon colitis.  Procedure: COLONOSCOPY WITH PROPOFOL;  Surgeon: Rogene Houston, MD;  [facility];  Service: Endoscopy;  Laterality: N/A;  730a   COLONOSCOPY WITH PROPOFOL N/A 09/15/2020   Procedure: COLONOSCOPY WITH PROPOFOL;  Surgeon: Harvel Quale, MD;  [facility];  Service: Gastroenterology;  Laterality: N/A;  1030   ESOPHAGEAL DILATION N/A 08/04/2015   Procedure: ESOPHAGEAL DILATION;  Surgeon: Rogene Houston, MD;  [facility];  Service: Endoscopy;  Laterality: N/AKelvin Cellar, no mucousal disruption   ESOPHAGOGASTRODUODENOSCOPY  09/27/2019   Performed for IDA.  Esoph dilation was done but no stricture present.  Mild portal hypertensive gastropathy, o/w normal.  Duodenal bx NEG.  h pylori neg.   ESOPHAGOGASTRODUODENOSCOPY (EGD) WITH ESOPHAGEAL DILATION  12/02/2005   ESOPHAGOGASTRODUODENOSCOPY (EGD) WITH PROPOFOL N/A 08/04/2015   Procedure: ESOPHAGOGASTRODUODENOSCOPY (EGD) WITH PROPOFOL;  Surgeon: Rogene Houston, MD;  [facility];  Service: Endoscopy;  Laterality: N/A;  procedure 1   ESOPHAGOGASTRODUODENOSCOPY (EGD) WITH PROPOFOL N/A 09/27/2019   Procedure: ESOPHAGOGASTRODUODENOSCOPY (EGD) WITH PROPOFOL;  Surgeon: Rogene Houston, MD;  [facility];  Service: Endoscopy;  Laterality: N/A;  12:10   ESOPHAGOGASTRODUODENOSCOPY (EGD) WITH PROPOFOL N/A 10/31/2021   Procedure: ESOPHAGOGASTRODUODENOSCOPY (EGD) WITH PROPOFOL;  Surgeon: Rogene Houston, MD;  [facility];  Service: Endoscopy;  Laterality: N/A;  [place]  01/17/2012   Procedure: FLEXIBLE  SIGMOIDOSCOPY;  Surgeon: Rogene Houston, MD;  [facility];  Service: Endoscopy;  Laterality: N/A;   GIVENS CAPSULE STUDY N/A 08/03/2019   Procedure: GIVENS CAPSULE STUDY (performed for IDA)->some food debris in stomach and small amount of blood.  Surgeon: Rogene Houston, MD;  [facility];  Service: Endoscopy;  Laterality: N/A;  730AM   HEMILAMINOTOMY LUMBAR SPINE Bilateral 09/07/1999   L4-5   KNEE ARTHROSCOPY Right 01/1999; 10/2000   LYSIS OF ADHESION  02/10/2001   MALONEY DILATION  09/27/2019   Procedure: MALONEY DILATION;  Surgeon: Rogene Houston, MD;  [facility];  Service: Endoscopy;;   NASAL ENDOSCOPY WITH EPISTAXIS CONTROL Bilateral 01/25/2022   Procedure: NASAL ENDOSCOPY WITH EPISTAXIS CONTROL;  Surgeon: Jerrell Belfast, MD;  Location: [place];  Service: ENT;  Laterality: Bilateral;   NASAL SEPTOPLASTY W/ TURBINOPLASTY Bilateral 01/25/2022   Procedure: NASAL SEPTOPLASTY WITH TURBINATE REDUCTION;  Surgeon: Jerrell Belfast, MD;  Location: [place];  Service: ENT;  Laterality: Bilateral;   [place]; 09/12/2006   TARSAL TUNNEL RELEASE  2002   TRANSTHORACIC ECHOCARDIOGRAM  08/04/2015   EF 60-65%, normal wall motion, mild LVH, mild LA dilation, grd I DD.   TUMOR EXCISION Left 03/21/2003   dorsal 1st web space (hand)   URETEROLYSIS Right 02/10/2001     Current Outpatient Medications:    Accu-Chek Softclix Lancets lancets, USE TO CHECK BLOOD GLUCOSE UP TO 6 TIMES DAILY AS DIRECTED, Disp: 200 each, Rfl: 1   acetaminophen (TYLENOL) 325 MG tablet, Take 650 mg by mouth every 6 (six) hours as needed for moderate pain or headache., Disp: , Rfl:    blood glucose meter kit and supplies KIT, Use up to six times daily as directed. DX. E11.9, Disp: 1 each, Rfl: 0   citalopram (CELEXA) 20 MG tablet, TAKE 1 TABLET BY MOUTH EVERY DAY (Patient taking differently: Take 20 mg by mouth at bedtime.), Disp: 90 tablet, Rfl: 3   dicyclomine (BENTYL) 10 MG capsule,  TAKE 1 CAPSULE (10 MG TOTAL) BY MOUTH EVERY 8 (EIGHT) HOURS AS NEEDED FOR SPASMS (ABDOMINAL PAIN)., Disp: 270 capsule, Rfl: 1   Dulaglutide (TRULICITY) 1.5 RS/8.5IO SOPN, INJECT 1.'5MG'$  ONCE WEEKLY, Disp: 18 mL, Rfl: 3   esomeprazole (NEXIUM) 20 MG capsule, TAKE 1 CAPSULE BY MOUTH DAILY BEFORE BREAKFAST, Disp: 90 capsule, Rfl: 3   furosemide (LASIX) 40 MG tablet, TAKE 1 TABLET BY MOUTH EVERY DAY, Disp: 90 tablet, Rfl: 0   gabapentin (NEURONTIN) 600 MG tablet, TAKE 1 AND 1/2 TABLETS BY MOUTH TWICE A DAY (Patient taking differently: Take 1.5 tablets by mouth 2 (two) times daily. TAKE 1 AND 1/2 TABLETS BY MOUTH TWICE A DAY), Disp: 90 tablet, Rfl: 0   glucose  blood (ACCU-CHEK GUIDE) test strip, USE TO CHECK BLOOD GLUCOSE UP TO 6 TIMES DAILY AS DIRECTED, Disp: 200 strip, Rfl: 1   INFLIXIMAB IV, Inject into the vein every 8 (eight) weeks. Remicaid '5mg'$ /kg Infusion every 8 weeks [facility]), Disp: , Rfl:    Insulin Pen Needle (B-D UF III MINI PEN NEEDLES) 31G X 5 MM MISC, USE TO INJECT INSULINS EQUAL TO 6 TIMES DAILY., Disp: 600 each, Rfl: 5   insulin regular human CONCENTRATED (HUMULIN R U-500 KWIKPEN) 500 UNIT/ML KwikPen, DIAL AND INJECT 100 UNITS UNDER THE SKIN WITH BREAKFAST AND DINNER AND 45 UNITS IN EVENING. (Patient taking differently: DIAL AND INJECT 25 UNITS UNDER THE SKIN WITH BREAKFAST AND DINNER AND 25 UNITS IN EVENING.), Disp: 48 mL, Rfl: 0   INVOKANA 100 MG TABS tablet, TAKE 1 TABLET BY MOUTH EVERY DAY BEFORE BREAKFAST, Disp: 90 tablet, Rfl: 0   lactase (LACTAID) 3000 UNITS tablet, Take 3,000 Units by mouth as needed (when eating foods containing dairy). , Disp: , Rfl:    loperamide (IMODIUM) 2 MG capsule, Take 1 capsule (2 mg total) by mouth 2 (two) times daily as needed for diarrhea or loose stools., Disp: 90 capsule, Rfl: 5   metoprolol succinate (TOPROL-XL) 100 MG 24 hr tablet, TAKE 1 TABLET BY MOUTH 2 TIMES DAILY. TAKE WITH OR IMMEDIATELY FOLLOWING A MEAL, Disp: 180 tablet, Rfl:  3   oxyCODONE (OXY IR/ROXICODONE) 5 MG immediate release tablet, 1-2 tabs po q6h prn, Disp: 42 tablet, Rfl: 0   potassium chloride (KLOR-CON M10) 10 MEQ tablet, Take 1 tablet (10 mEq total) by mouth daily., Disp: 180 tablet, Rfl: 0   XARELTO 10 MG TABS tablet, TAKE 1 TABLET BY MOUTH EVERY DAY, Disp: 30 tablet, Rfl: 0   amitriptyline (ELAVIL) 25 MG tablet, Take 25 mg by mouth in the morning and at bedtime. (Patient not taking: Reported on 10/09/2022), Disp: , Rfl:    ferric carboxymaltose (INJECTAFER) 750 MG/15ML SOLN injection, Inject 15 mLs (750 mg total) into the vein as directed for 1 dose. Repeat in 7 days (Patient not taking: Reported on 10/04/2022), Disp: 15 mL, Rfl: 1   meclizine (ANTIVERT) 25 MG tablet, Take 1 tablet (25 mg total) by mouth 3 (three) times daily as needed for dizziness or nausea. (Patient not taking: Reported on 10/09/2022), Disp: 90 tablet, Rfl: 0   methocarbamol (ROBAXIN) 500 MG tablet, Take 1 tablet (500 mg total) by mouth 3 (three) times daily. May cause drowsiness (Patient not taking: Reported on 10/09/2022), Disp: 21 tablet, Rfl: 0   Pediatric Multivitamins-Iron (FLINTSTONES COMPLETE) 18 MG CHEW, Chew 1 tablet by mouth 2 (two) times daily. (Patient not taking: Reported on 10/09/2022), Disp: , Rfl:   EXAM:  VITALS per patient if applicable:     12/31/4740    8:10 AM 10/10/2022    3:14 AM 10/09/2022    9:36 PM  Vitals with BMI  Systolic 595 638 756  Diastolic 49 57 71  Pulse 70 75 81    No further exam b/c audio visit only.   LABS: none today   Chemistry      Component Value Date/Time   NA 137 10/10/2022 0512   NA 144 10/11/2020 0000   K 3.8 10/10/2022 0512   CL 108 10/10/2022 0512   CO2 24 10/10/2022 0512   BUN 13 10/10/2022 0512   BUN 11 10/11/2020 0000   CREATININE 0.58 10/10/2022 0512   CREATININE 0.71 11/22/2021 1447   GLU 306 10/11/2020 0000  Component Value Date/Time   CALCIUM 7.7 (L) 10/10/2022 0512   ALKPHOS 142 (H) 10/09/2022 1347   AST 69  (H) 10/09/2022 1347   ALT 47 (H) 10/09/2022 1347   BILITOT 0.9 10/09/2022 1347     Lab Results  Component Value Date   WBC 6.3 10/10/2022   HGB 11.8 (L) 10/10/2022   HCT 34.5 (L) 10/10/2022   MCV 99.7 10/10/2022   PLT 119 (L) 10/10/2022   Lab Results  Component Value Date   HGBA1C 6.1 (A) 08/28/2022   HGBA1C 6.1 08/28/2022   HGBA1C 6.1 08/28/2022   HGBA1C 6.1 08/28/2022   ASSESSMENT AND PLAN:  Discussed the following assessment and plan:  #1 acute low back pain, history of spondylosis, remote history of lumbar laminectomy. Recent MRI with and without contrast on 10/09/2022 showed a mild acute superior endplate compression fracture at L5 without osseous retropulsion.  Otherwise there was multilevel mild to moderate multifactorial spinal stenosis.  No findings suggestive of infection. MRI of the sacrum and SI joints without notable abnormality. Initial visit with neurosurgery, Dr. Saintclair Halsted, set for 6 days from now. Recommended she stop ibuprofen since she is on Xarelto. Recommended Tylenol 1000 mg 3 times a day.  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  Hydrocodone did not help.  Patient fearful that oxycodone caused hypoglycemia.  #2 hypoglycemia in the setting of historically well-controlled diabetes. She has decreased her U-500 dosing to 25 units in each meal and her lowest glucose since the ED visit was 94.  Err on the side of hyperglycemia in the near future.   Spent 14 min with pt today reviewing HPI and ED visit documentation, reviewing relevant past history, reviewing and discussing lab and imaging data, and formulating plans.  I discussed the assessment and treatment plan with the patient. The patient was provided an opportunity to ask questions and all were answered. The patient agreed with the plan and demonstrated an understanding of the instructions.   F/u: 1 week  Signed:  Crissie Sickles, MD           10/16/2022

## 2022-10-18 DIAGNOSIS — K5100 Ulcerative (chronic) pancolitis without complications: Secondary | ICD-10-CM | POA: Diagnosis not present

## 2022-10-22 ENCOUNTER — Other Ambulatory Visit (HOSPITAL_COMMUNITY): Payer: Self-pay | Admitting: Neurosurgery

## 2022-10-22 DIAGNOSIS — S32050A Wedge compression fracture of fifth lumbar vertebra, initial encounter for closed fracture: Secondary | ICD-10-CM

## 2022-10-22 DIAGNOSIS — M48062 Spinal stenosis, lumbar region with neurogenic claudication: Secondary | ICD-10-CM | POA: Diagnosis not present

## 2022-10-25 LAB — C. DIFFICILE GDH AND TOXIN A/B
GDH ANTIGEN: NOT DETECTED
MICRO NUMBER:: 14452634
SPECIMEN QUALITY:: ADEQUATE
TOXIN A AND B: NOT DETECTED

## 2022-10-25 LAB — GASTROINTESTINAL PATHOGEN PNL

## 2022-10-25 LAB — CALPROTECTIN: Calprotectin: 206 mcg/g — ABNORMAL HIGH

## 2022-10-28 ENCOUNTER — Other Ambulatory Visit: Payer: Self-pay | Admitting: Family Medicine

## 2022-10-29 ENCOUNTER — Other Ambulatory Visit (INDEPENDENT_AMBULATORY_CARE_PROVIDER_SITE_OTHER): Payer: Self-pay

## 2022-10-29 DIAGNOSIS — K5100 Ulcerative (chronic) pancolitis without complications: Secondary | ICD-10-CM

## 2022-10-29 DIAGNOSIS — K580 Irritable bowel syndrome with diarrhea: Secondary | ICD-10-CM

## 2022-10-29 DIAGNOSIS — K5180 Other ulcerative colitis without complications: Secondary | ICD-10-CM

## 2022-10-29 DIAGNOSIS — R197 Diarrhea, unspecified: Secondary | ICD-10-CM

## 2022-10-29 DIAGNOSIS — K219 Gastro-esophageal reflux disease without esophagitis: Secondary | ICD-10-CM

## 2022-10-29 DIAGNOSIS — R160 Hepatomegaly, not elsewhere classified: Secondary | ICD-10-CM

## 2022-10-29 DIAGNOSIS — K7460 Unspecified cirrhosis of liver: Secondary | ICD-10-CM

## 2022-10-29 DIAGNOSIS — Z862 Personal history of diseases of the blood and blood-forming organs and certain disorders involving the immune mechanism: Secondary | ICD-10-CM

## 2022-10-30 DIAGNOSIS — K7460 Unspecified cirrhosis of liver: Secondary | ICD-10-CM | POA: Diagnosis not present

## 2022-10-30 DIAGNOSIS — K5100 Ulcerative (chronic) pancolitis without complications: Secondary | ICD-10-CM | POA: Diagnosis not present

## 2022-10-31 ENCOUNTER — Telehealth (INDEPENDENT_AMBULATORY_CARE_PROVIDER_SITE_OTHER): Payer: Self-pay | Admitting: *Deleted

## 2022-10-31 ENCOUNTER — Telehealth: Payer: Self-pay | Admitting: Cardiology

## 2022-10-31 ENCOUNTER — Telehealth: Payer: Self-pay | Admitting: *Deleted

## 2022-10-31 DIAGNOSIS — K5180 Other ulcerative colitis without complications: Secondary | ICD-10-CM | POA: Diagnosis not present

## 2022-10-31 DIAGNOSIS — G4733 Obstructive sleep apnea (adult) (pediatric): Secondary | ICD-10-CM | POA: Diagnosis not present

## 2022-10-31 NOTE — Telephone Encounter (Signed)
noted 

## 2022-10-31 NOTE — Telephone Encounter (Signed)
[name] from [facility] called to report critical value. Glucose was 421 on labs drawn yesterday. I called patient to see if she had checked sugar today and see how she was feeling. Husband  ( on dpr) answered and said she was getting her remacaid infusion and I let him know her sugar was 421. He said she was feeling fine and she did check it today but he is not sure what it was. Said she had to go to ED a few weeks ago because it was low and maybe they going overboard trying to get it up. I advised him to let her know to call her pcp who manages her diabetes. He said he would.

## 2022-10-31 NOTE — Telephone Encounter (Signed)
Patient is calling to see if Dr. Stanford Breed or nurse could call to get medical clearance. Told patient that the office would have to call to receive medical clearance so that she can get injection in her back.

## 2022-10-31 NOTE — Telephone Encounter (Signed)
Caller is following up on status of patient's clearance. 

## 2022-10-31 NOTE — Telephone Encounter (Signed)
Received EGD triage questions. Per st OV 08/2022 Dr. Jenetta Downer stated "Repeat EGD will be scheduled next appointment":. Pt scheduled to come in 12/16/22.

## 2022-10-31 NOTE — Telephone Encounter (Signed)
   Pre-operative Risk Assessment    Patient Name: [name]  DOB: 01-11-1946 MRN: [id number]      Request for Surgical Clearance    Procedure:   LESI L4-L5  Date of Surgery:  Clearance TBD -STAT                                Surgeon:  Dr. Lenord Carbo Surgeon's Group or Practice Name:  [facility] Phone number:  [phone] Fax number:  [phone]   Type of Clearance Requested:   - Pharmacy:  Hold Rivaroxaban (Xarelto) 3 days prior, resume day after   Type of Anesthesia:  Not Indicated   Additional requests/questions:    Hervey Ard   10/31/2022, 3:12 PM

## 2022-10-31 NOTE — Telephone Encounter (Signed)
Called provided number. Spoke with patient;s husband. He states she is going to get back injections from Dr. Saintclair Halsted. I advised him Dr.Cram's office would have to contact our office to request medical clearance. He verbalized understanding. He will reach out to that office.

## 2022-10-31 NOTE — Telephone Encounter (Signed)
Thanks for informing them about this.

## 2022-11-01 MED ORDER — RIVAROXABAN 20 MG PO TABS: 20.0000 mg | ORAL_TABLET | Freq: Every day | ORAL | 5 refills | Status: DC

## 2022-11-01 NOTE — Telephone Encounter (Signed)
I have been unable to figure out how patient's Xarelto got changed to '10mg'$  daily and why it has been continued to be refilled at the wrong dose.  Patient has Afib. Her ABW CrCl is >50 therefore her dose should be '20mg'$  daily.  I spoke with patient and advised that her dose should be '20mg'$  and I was going to call her pharmacy. Called pharmacy, ahd '10mg'$  rx d/c and sent in '20mg'$  rx.  Patient with diagnosis of afib on Xarelto for anticoagulation.    Procedure:   LESI L4-L5  Date of procedure: TBD-STAT   CHA2DS2-VASc Score = 5   This indicates a 7.2% annual risk of stroke. The patient's score is based upon: CHF History: 0 HTN History: 1 Diabetes History: 1 Stroke History: 0 Vascular Disease History: 0 Age Score: 2 Gender Score: 1      CrCl 75 ml/min  Per office protocol, patient can hold Xarelto for 3 days prior to procedure.    **This guidance is not considered finalized until pre-operative APP has relayed final recommendations.**

## 2022-11-01 NOTE — Telephone Encounter (Signed)
Clinical pharmacist to review Xarelto, not sure why 10 mg of Xarelto was listed under medication list.  She was previously on 20 mg daily of Xarelto in November 2022 for A-fib when she saw Dr. Stanford Breed.  She qualified for the 20 mg dosage.

## 2022-11-01 NOTE — Telephone Encounter (Signed)
Sent message to pre op app if the pt is also needing an appt.

## 2022-11-01 NOTE — Telephone Encounter (Signed)
   Name: [name]  DOB: [birth year]  MRN: [id number]  Primary Cardiologist: Kirk Ruths, MD  Chart reviewed as part of pre-operative protocol coverage. Because of [name] Reason's past medical history and time since last visit, she will require a follow-up in-office visit in order to better assess preoperative cardiovascular risk.  Pre-op covering staff: - Please schedule appointment and call patient to inform them. If patient already had an upcoming appointment within acceptable timeframe, please add "pre-op clearance" to the appointment notes so provider is aware. - Please contact requesting surgeon's office via preferred method (i.e, phone, fax) to inform them of need for appointment prior to surgery.    [place], [place]  11/01/2022, 11:48 AM

## 2022-11-01 NOTE — Telephone Encounter (Signed)
I s./w the pt and she is agreeable to in office appt 11/04/22 with Dr. Stanford Breed. I will update all parties.

## 2022-11-04 ENCOUNTER — Telehealth: Payer: Self-pay | Admitting: Cardiology

## 2022-11-04 ENCOUNTER — Ambulatory Visit: Payer: Medicare PPO | Admitting: Cardiology

## 2022-11-04 ENCOUNTER — Encounter: Payer: Self-pay | Admitting: Cardiology

## 2022-11-04 VITALS — BP 129/71 | HR 65 | Ht 62.0 in | Wt 219.6 lb

## 2022-11-04 DIAGNOSIS — I10 Essential (primary) hypertension: Secondary | ICD-10-CM | POA: Diagnosis not present

## 2022-11-04 DIAGNOSIS — E7800 Pure hypercholesterolemia, unspecified: Secondary | ICD-10-CM | POA: Diagnosis not present

## 2022-11-04 DIAGNOSIS — Z01810 Encounter for preprocedural cardiovascular examination: Secondary | ICD-10-CM

## 2022-11-04 DIAGNOSIS — I480 Paroxysmal atrial fibrillation: Secondary | ICD-10-CM

## 2022-11-04 NOTE — Patient Instructions (Addendum)
Medication Instructions:   OKAY TO HOLD XARELTO 3 DAYS PRIOR TO INJECTION-RESTART WHEN OKAY WITH PROVIDER GIVING INJECTION  *If you need a refill on your cardiac medications before your next appointment, please call your pharmacy*   Follow-Up: At [facility], you and your health needs are our priority.  As part of our continuing mission to provide you with exceptional heart care, we have created designated Provider Care Teams.  These Care Teams include your primary Cardiologist (physician) and Advanced Practice Providers (APPs -  Physician Assistants and Nurse Practitioners) who all work together to provide you with the care you need, when you need it.  We recommend signing up for the patient portal called "MyChart".  Sign up information is provided on this After Visit Summary.  MyChart is used to connect with patients for Virtual Visits (Telemedicine).  Patients are able to view lab/test results, encounter notes, upcoming appointments, etc.  Non-urgent messages can be sent to your provider as well.   To learn more about what you can do with MyChart, go to [email or web address].    Your next appointment:   6 month(s)  Provider:   Kirk Ruths, MD

## 2022-11-04 NOTE — Telephone Encounter (Signed)
Operator sent me a secure chat stating pt asked if her appt could be a tele visit  due to her pain. I stated to the operator per the pre op APP following protocol pt needs in office appt. Operator stated she will let the pt know. Pt has appt today at 1:30 with Dr. Stanford Breed for pre op clearance.    I will update all parties involved.

## 2022-11-04 NOTE — Progress Notes (Signed)
HPI: FU atrial fibrillation. Stress echocardiogram performed in September of 2011 was interpreted as normal. There was mention that LV function may not have improved as much as expected and if clinical suspicion high consider alternative imaging. Lexiscan Myoview in November of 2011 showed normal perfusion. Study not gated. CardioNet showed sinus with PVCs. Had EGD in November 2016 and developed atrial fibrillation. Placed on amiodarone and xarelto. Echocardiogram November 2016 showed normal LV function, moderate left ventricular hypertrophy, grade 1 diastolic dysfunction and mild left atrial enlargement. Amiodarone DCed at previous ov. Since she was last seen, she denies dyspnea, chest pain, palpitations or syncope.  She continues to have daily nosebleeds.  She is having problems with her back which will require injections.  Current Outpatient Medications  Medication Sig Dispense Refill   Accu-Chek Softclix Lancets lancets USE TO CHECK BLOOD GLUCOSE UP TO 6 TIMES DAILY AS DIRECTED 200 each 1   acetaminophen (TYLENOL) 325 MG tablet Take 650 mg by mouth every 6 (six) hours as needed for moderate pain or headache.     amitriptyline (ELAVIL) 25 MG tablet Take 25 mg by mouth in the morning and at bedtime.     blood glucose meter kit and supplies KIT Use up to six times daily as directed. DX. E11.9 1 each 0   citalopram (CELEXA) 20 MG tablet TAKE 1 TABLET BY MOUTH EVERY DAY (Patient taking differently: Take 20 mg by mouth at bedtime.) 90 tablet 3   dicyclomine (BENTYL) 10 MG capsule TAKE 1 CAPSULE (10 MG TOTAL) BY MOUTH EVERY 8 (EIGHT) HOURS AS NEEDED FOR SPASMS (ABDOMINAL PAIN). 270 capsule 1   Dulaglutide (TRULICITY) 1.5 ZO/1.0RU SOPN INJECT 1.'5MG'$  ONCE WEEKLY 18 mL 3   esomeprazole (NEXIUM) 20 MG capsule TAKE 1 CAPSULE BY MOUTH DAILY BEFORE BREAKFAST 90 capsule 3   ferric carboxymaltose (INJECTAFER) 750 MG/15ML SOLN injection Inject 15 mLs (750 mg total) into the vein as directed for 1 dose. Repeat  in 7 days 15 mL 1   furosemide (LASIX) 40 MG tablet TAKE 1 TABLET BY MOUTH EVERY DAY 90 tablet 0   gabapentin (NEURONTIN) 600 MG tablet TAKE 1 AND 1/2 TABLETS BY MOUTH TWICE A DAY (Patient taking differently: Take 1.5 tablets by mouth 2 (two) times daily. TAKE 1 AND 1/2 TABLETS BY MOUTH TWICE A DAY) 90 tablet 0   glucose blood (ACCU-CHEK GUIDE) test strip USE TO CHECK BLOOD GLUCOSE UP TO 6 TIMES DAILY AS DIRECTED 200 strip 1   INFLIXIMAB IV Inject into the vein every 8 (eight) weeks. Remicaid '5mg'$ /kg Infusion every 8 weeks [facility])     Insulin Pen Needle (B-D UF III MINI PEN NEEDLES) 31G X 5 MM MISC USE TO INJECT INSULINS EQUAL TO 6 TIMES DAILY. 600 each 5   insulin regular human CONCENTRATED (HUMULIN R U-500 KWIKPEN) 500 UNIT/ML KwikPen DIAL AND INJECT 100 UNITS UNDER THE SKIN WITH BREAKFAST AND DINNER AND 45 UNITS IN EVENING. (Patient taking differently: DIAL AND INJECT 25 UNITS UNDER THE SKIN WITH BREAKFAST AND DINNER AND 25 UNITS IN EVENING.) 48 mL 0   INVOKANA 100 MG TABS tablet TAKE 1 TABLET BY MOUTH EVERY DAY BEFORE BREAKFAST 90 tablet 0   lactase (LACTAID) 3000 UNITS tablet Take 3,000 Units by mouth as needed (when eating foods containing dairy).      loperamide (IMODIUM) 2 MG capsule Take 1 capsule (2 mg total) by mouth 2 (two) times daily as needed for diarrhea or loose stools. 90 capsule 5  meclizine (ANTIVERT) 25 MG tablet Take 1 tablet (25 mg total) by mouth 3 (three) times daily as needed for dizziness or nausea. 90 tablet 0   metoprolol succinate (TOPROL-XL) 100 MG 24 hr tablet TAKE 1 TABLET BY MOUTH 2 TIMES DAILY. TAKE WITH OR IMMEDIATELY FOLLOWING A MEAL 180 tablet 3   Pediatric Multivitamins-Iron (FLINTSTONES COMPLETE) 18 MG CHEW Chew 1 tablet by mouth 2 (two) times daily.     potassium chloride (KLOR-CON M10) 10 MEQ tablet Take 1 tablet (10 mEq total) by mouth daily. 180 tablet 0   rivaroxaban (XARELTO) 20 MG TABS tablet Take 1 tablet (20 mg total) by mouth daily  with supper. 30 tablet 5   methocarbamol (ROBAXIN) 500 MG tablet Take 1 tablet (500 mg total) by mouth 3 (three) times daily. May cause drowsiness (Patient not taking: Reported on 11/04/2022) 21 tablet 0   No current facility-administered medications for this visit.     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 ? to get liver bx if she gets bariatric surgery? Mild portal hypertensive gastropathy on EGD 08/2019.   Dysrhythmia    Fibromyalgia    GAD (generalized anxiety disorder)    GERD    History of hiatal hernia    History of iron deficiency anemia 12/2018   Inadequate absorption secondary to chronic/long term PPI therapy + portal hypertensive gastroduodenopathy. No GIB found on EGD, colonosc, and givens. Iron infusions X multiple.   History of thrombocytopenia 12/2011   Hyperlipidemia    Intolerant of statins   HYPERTENSION    IBS (irritable bowel syndrome)    -D.  Good response to bentyl and imodium as of 06/2018 GI f/u.   IDDM (insulin dependent diabetes mellitus)    with DPN (managed by Dr. Cruzita Lederer but then in 2018 pt preferred to have me manage for her convenience)   Limited mobility    Requires a walker for arthritic pain, widespread musculoskeletal pain, and neuropathic pain.   Morbid obesity ([place])    As of 11/2018, pt considering sleeve gastectomy vs bipass as of eval by Dr. De Burrs considering as of 12/2019.   Nonalcoholic steatohepatitis    Viral Hep screens NEG.  CT 2015.  Transaminasemia.  U/S 07/2018 showed early changes of cirrhosis.   OSA (obstructive sleep apnea) 09/14/2015   sleep study 09/07/15: severe obstructive sleep apnea with an AHI of 72 and SaO2 low of 75%.>referred to sleep MD   Osteoarthritis    hips, shoulders, knees   PAF (paroxysmal atrial fibrillation) ([place])    One documented episode (after  getting EGD 2016).  Was on amiodarone x 3 mo.  Rate control with metoprolol + anticoag with xarelto.    Recurrent epistaxis    Granuloma in L nare cauterized by ENT 04/2020. Another cautery 06/2020   Small fiber neuropathy    Due to DM.  Symmetric hands and feet tingling/numbness.   Ulcerative colitis ([place])    Remicade infusion Q 8 weeks: in clinical and endoscopic remission as of 12/2018 GI f/u.  06/11/19 rpt colonoscopy->cecal and ascending colon colitis.    Past Surgical History:  Procedure Laterality Date   ABDOMINAL HYSTERECTOMY  1980   Paps no longer indicated.   BACK SURGERY     BACTERIAL OVERGROWTH TEST N/A 07/13/2015   Procedure: BACTERIAL OVERGROWTH TEST;  Surgeon: Rogene Houston, MD;  [facility];  Service: Endoscopy;  Laterality: N/A;  730     BILATERAL SALPINGOOPHORECTOMY  02/10/2001   BIOPSY  06/11/2019   Procedure: BIOPSY;  Surgeon: Rogene Houston, MD;  [facility];  Service: Endoscopy;;  colon   BIOPSY  09/27/2019   Procedure: BIOPSY;  Surgeon: Rogene Houston, MD;  [facility];  Service: Endoscopy;;  gastric duodenal   BIOPSY  09/15/2020   Procedure: BIOPSY;  Surgeon: Harvel Quale, MD;  [facility];  Service: Gastroenterology;;   BREAST REDUCTION SURGERY  1994   bilat   BREAST SURGERY     CARDIOVASCULAR STRESS TEST  07/2010   Lexiscan myoview: normal   CARPAL TUNNEL RELEASE Right 1996   CARPAL TUNNEL RELEASE Left 03/21/2003   CARPAL TUNNEL RELEASE Right 05/04/2013   Procedure: CARPAL TUNNEL RELEASE;  Surgeon: Cammie Sickle., MD;  Location: [place];  Service: Orthopedics;  Laterality: Right;   CARPAL TUNNEL RELEASE Left 09/21/2013   Procedure: LEFT CARPAL TUNNEL RELEASE;  Surgeon: Cammie Sickle., MD;  Location: [place];  Service: Orthopedics;  Laterality: Left;   CHOLECYSTECTOMY     COLONOSCOPY WITH PROPOFOL N/A 08/04/2015   Colitis in remission.  No  polyps.  Procedure: COLONOSCOPY WITH PROPOFOL;  Surgeon: Rogene Houston, MD;  [facility];  Service: Endoscopy;  Laterality: N/A;  cecum time in  0820   time out  0827    total time 7 minutes   COLONOSCOPY WITH PROPOFOL N/A 06/11/2019   cecal and ascending colon colitis.  Procedure: COLONOSCOPY WITH PROPOFOL;  Surgeon: Rogene Houston, MD;  [facility];  Service: Endoscopy;  Laterality: N/A;  730a   COLONOSCOPY WITH PROPOFOL N/A 09/15/2020   Procedure: COLONOSCOPY WITH PROPOFOL;  Surgeon: Harvel Quale, MD;  [facility];  Service: Gastroenterology;  Laterality: N/A;  1030   ESOPHAGEAL DILATION N/A 08/04/2015   Procedure: ESOPHAGEAL DILATION;  Surgeon: Rogene Houston, MD;  [facility];  Service: Endoscopy;  Laterality: N/AKelvin Cellar, no mucousal disruption   ESOPHAGOGASTRODUODENOSCOPY  09/27/2019   Performed for IDA.  Esoph dilation was done but no stricture present.  Mild portal hypertensive gastropathy, o/w normal.  Duodenal bx NEG.  h pylori neg.   ESOPHAGOGASTRODUODENOSCOPY (EGD) WITH ESOPHAGEAL DILATION  12/02/2005   ESOPHAGOGASTRODUODENOSCOPY (EGD) WITH PROPOFOL N/A 08/04/2015   Procedure: ESOPHAGOGASTRODUODENOSCOPY (EGD) WITH PROPOFOL;  Surgeon: Rogene Houston, MD;  [facility];  Service: Endoscopy;  Laterality: N/A;  procedure 1   ESOPHAGOGASTRODUODENOSCOPY (EGD) WITH PROPOFOL N/A 09/27/2019   Procedure: ESOPHAGOGASTRODUODENOSCOPY (EGD) WITH PROPOFOL;  Surgeon: Rogene Houston, MD;  [facility];  Service: Endoscopy;  Laterality: N/A;  12:10   ESOPHAGOGASTRODUODENOSCOPY (EGD) WITH PROPOFOL N/A 10/31/2021   Procedure: ESOPHAGOGASTRODUODENOSCOPY (EGD) WITH PROPOFOL;  Surgeon: Rogene Houston, MD;  [facility];  Service: Endoscopy;  Laterality: N/A;  [place]  01/17/2012   Procedure: FLEXIBLE SIGMOIDOSCOPY;  Surgeon: Rogene Houston, MD;  [facility];  Service: Endoscopy;   Laterality: N/A;   GIVENS CAPSULE STUDY N/A 08/03/2019   Procedure: GIVENS CAPSULE STUDY (performed for IDA)->some food debris in stomach and small amount of blood.  Surgeon: Rogene Houston, MD;  [facility];  Service: Endoscopy;  Laterality: N/A;  730AM   HEMILAMINOTOMY LUMBAR SPINE Bilateral 09/07/1999   L4-5   KNEE ARTHROSCOPY Right 01/1999; 10/2000  LYSIS OF ADHESION  02/10/2001   MALONEY DILATION  09/27/2019   Procedure: Venia Minks DILATION;  Surgeon: Rogene Houston, MD;  [facility];  Service: Endoscopy;;   NASAL ENDOSCOPY WITH EPISTAXIS CONTROL Bilateral 01/25/2022   Procedure: NASAL ENDOSCOPY WITH EPISTAXIS CONTROL;  Surgeon: Jerrell Belfast, MD;  Location: [place];  Service: ENT;  Laterality: Bilateral;   NASAL SEPTOPLASTY W/ TURBINOPLASTY Bilateral 01/25/2022   Procedure: NASAL SEPTOPLASTY WITH TURBINATE REDUCTION;  Surgeon: Jerrell Belfast, MD;  Location: [place];  Service: ENT;  Laterality: Bilateral;   [place]; 09/12/2006   TARSAL TUNNEL RELEASE  2002   TRANSTHORACIC ECHOCARDIOGRAM  08/04/2015   EF 60-65%, normal wall motion, mild LVH, mild LA dilation, grd I DD.   TUMOR EXCISION Left 03/21/2003   dorsal 1st web space (hand)   URETEROLYSIS Right 02/10/2001    Social History   Socioeconomic History   Marital status: Married    Spouse name: Not on file   Number of children: Not on file   Years of education: Not on file   Highest education level: Not on file  Occupational History   Occupation: Retired  Tobacco Use   Smoking status: Never   Smokeless tobacco: Never  Vaping Use   Vaping Use: Never used  Substance and Sexual Activity   Alcohol use: Not Currently    Comment: ocassionally   Drug use: No   Sexual activity: Not Currently    Partners: Male    Birth control/protection: Surgical    Comment: hysterectomy  Other Topics Concern   Not on file  Social History Narrative   She lives with husband in [place] home.  They have  one grown son and 2 grandchildren.   She is retired [employer].   Highest of level education:  Some college.   Never smoker.   Alcohol: rare.   Social Determinants of Health   Financial Resource Strain: Medium Risk (11/29/2020)   Overall Financial Resource Strain (CARDIA)    Difficulty of Paying Living Expenses: Somewhat hard  Food Insecurity: No Food Insecurity (10/09/2022)   Hunger Vital Sign    Worried About Running Out of Food in the Last Year: Never true    Ran Out of Food in the Last Year: Never true  Transportation Needs: No Transportation Needs (10/09/2022)   PRAPARE - Hydrologist (Medical): No    Lack of Transportation (Non-Medical): No  Physical Activity: Inactive (11/29/2020)   Exercise Vital Sign    Days of Exercise per Week: 0 days    Minutes of Exercise per Session: 0 min  Stress: No Stress Concern Present (12/10/2021)   [place]    Feeling of Stress : Not at all  Social Connections: Moderately Isolated (12/11/2021)   Social Connection and Isolation Panel [NHANES]    Frequency of Communication with Friends and Family: More than three times a week    Frequency of Social Gatherings with Friends and Family: More than three times a week    Attends Religious Services: Never    Marine scientist or Organizations: No    Attends Archivist Meetings: Never    Marital Status: Married  Human resources officer Violence: Not At Risk (10/09/2022)   Humiliation, Afraid, Rape, and Kick questionnaire    Fear of Current or Ex-Partner: No    Emotionally Abused: No    Physically Abused: No    Sexually Abused: No  Family History  Problem Relation Age of Onset   Diabetes Mother    Hypertension Mother    Heart attack Father        Mid 11's   Heart disease Father    Lung disease Father        spot on lung; had lung surgery   Alcohol abuse Other    Hypertension Son     Diabetes Son    Emphysema Maternal Grandfather    Asthma Maternal Grandfather    Colon cancer Paternal Grandfather    Stomach cancer Paternal Grandmother    Neuropathy Neg Hx     ROS: Back pain but no fevers or chills, productive cough, hemoptysis, dysphasia, odynophagia, melena, hematochezia, dysuria, hematuria, rash, seizure activity, orthopnea, PND, pedal edema, claudication. Remaining systems are negative.  Physical Exam: Well-developed well-nourished in no acute distress.  Skin is warm and dry.  HEENT is normal.  Neck is supple.  Chest is clear to auscultation with normal expansion.  Cardiovascular exam is regular rate and rhythm.  Abdominal exam nontender or distended. No masses palpated. Extremities show no edema. neuro grossly intact  A/P  1 paroxysmal atrial fibrillation-patient remains in sinus rhythm on exam today.  Continue metoprolol and Xarelto.  Note she continues to have difficulties with daily nosebleeds.  I will ask Dr. Burt Knack to evaluate for potential Watchman device.  Center Line Referral for Left Atrial Appendage Closure with Non-Valvular Atrial Fibrillation   [name] is a 77 y.o. female is being referred to the [facility] Team for evaluation for Left Atrial Appendage Closure with Watchman device for the management of stroke risk resulting form non-valvular atrial fibrillation.    Base upon Ms. Linders's history, she is felt to be a poor candidate for long-term anticoagulation because of a history of bleeding (e.g. intracerebral, subdural, GI, retro-peritoneal).  The patient has a HAS-BLED score of 4 indicating a Yearly Major Bleeding Risk of 8.7%.  Her CHADS2-VASc Score is 5 with an unadjusted Ischemic Stroke Rate (% per year) of 7.2%.  Her stroke risk necessitates a strategy of stroke prevention with either long-term oral anticoagulation or left atrial appendage occlusion therapy. We have discussed their bleeding risk in the context of  their comorbid medical problems, as well as the rationale for referral for evaluation of Watchman left atrial appendage occlusion therapy. While the patient is at high long-term bleeding risk, they may be appropriate for short-term anticoagulation. Based on this individual patient's stroke and bleeding risk, a shared decision has been made to refer the patient for consideration of Watchman left atrial appendage closure utilizing the [employer] of Cardiology shared decision tool.   2 preoperative evaluation-patient is scheduled for back injections.  I have asked her to discontinue her Xarelto 3 days prior to the procedure and resume after when okay with orthopedics.  3 hypertension-blood pressure controlled.  Continue present medical regimen.  4 hyperlipidemia-managed by primary care.  Kirk Ruths, MD

## 2022-11-04 NOTE — Telephone Encounter (Signed)
Pt would ike a callback as to whether Pre Op app for today can be over the phone, being that she is in a lot of pain and does not think she can make it into the office. Please advise

## 2022-11-04 NOTE — Telephone Encounter (Signed)
Operator sent me a secure chat stating pt asked if her appt could be a tele visit  due to her pain. I stated to the operator per the pre op APP following protocol pt needs in office appt. Operator stated she will let the pt know. Pt has appt today at 1:30 with Dr. Stanford Breed for pre op clearance.

## 2022-11-05 ENCOUNTER — Ambulatory Visit (HOSPITAL_COMMUNITY)
Admission: RE | Admit: 2022-11-05 | Discharge: 2022-11-05 | Disposition: A | Discharge status: Home / Self Care | Payer: Medicare PPO | Source: Ambulatory Visit | Attending: Neurosurgery | Admitting: Neurosurgery

## 2022-11-05 ENCOUNTER — Ambulatory Visit (HOSPITAL_COMMUNITY)
Admission: RE | Admit: 2022-11-05 | Discharge: 2022-11-05 | Disposition: A | Discharge status: Home / Self Care | Payer: Medicare PPO | Source: Ambulatory Visit | Attending: Gastroenterology | Admitting: Gastroenterology

## 2022-11-05 DIAGNOSIS — S32050A Wedge compression fracture of fifth lumbar vertebra, initial encounter for closed fracture: Secondary | ICD-10-CM | POA: Insufficient documentation

## 2022-11-05 DIAGNOSIS — I480 Paroxysmal atrial fibrillation: Secondary | ICD-10-CM | POA: Diagnosis not present

## 2022-11-05 DIAGNOSIS — K529 Noninfective gastroenteritis and colitis, unspecified: Secondary | ICD-10-CM | POA: Diagnosis not present

## 2022-11-05 DIAGNOSIS — K7460 Unspecified cirrhosis of liver: Secondary | ICD-10-CM

## 2022-11-05 DIAGNOSIS — E7800 Pure hypercholesterolemia, unspecified: Secondary | ICD-10-CM | POA: Insufficient documentation

## 2022-11-05 DIAGNOSIS — I10 Essential (primary) hypertension: Secondary | ICD-10-CM | POA: Diagnosis not present

## 2022-11-05 DIAGNOSIS — A09 Infectious gastroenteritis and colitis, unspecified: Secondary | ICD-10-CM | POA: Diagnosis not present

## 2022-11-05 DIAGNOSIS — K5100 Ulcerative (chronic) pancolitis without complications: Secondary | ICD-10-CM | POA: Diagnosis not present

## 2022-11-05 DIAGNOSIS — Z0389 Encounter for observation for other suspected diseases and conditions ruled out: Secondary | ICD-10-CM | POA: Diagnosis not present

## 2022-11-05 DIAGNOSIS — Z780 Asymptomatic menopausal state: Secondary | ICD-10-CM | POA: Diagnosis not present

## 2022-11-05 DIAGNOSIS — Z01810 Encounter for preprocedural cardiovascular examination: Secondary | ICD-10-CM | POA: Diagnosis not present

## 2022-11-05 DIAGNOSIS — M8589 Other specified disorders of bone density and structure, multiple sites: Secondary | ICD-10-CM | POA: Diagnosis not present

## 2022-11-05 LAB — COMPREHENSIVE METABOLIC PANEL
AG Ratio: 1 (calc) (ref 1.0–2.5)
ALT: 38 U/L — ABNORMAL HIGH (ref 6–29)
AST: 54 U/L — ABNORMAL HIGH (ref 10–35)
Albumin: 3.2 g/dL — ABNORMAL LOW (ref 3.6–5.1)
Alkaline phosphatase (APISO): 142 U/L (ref 37–153)
BUN: 9 mg/dL (ref 7–25)
CO2: 27 mmol/L (ref 20–32)
Calcium: 8.4 mg/dL — ABNORMAL LOW (ref 8.6–10.4)
Chloride: 101 mmol/L (ref 98–110)
Creat: 0.71 mg/dL (ref 0.60–1.00)
Globulin: 3.2 g/dL (calc) (ref 1.9–3.7)
Glucose, Bld: 421 mg/dL — ABNORMAL HIGH (ref 65–99)
Potassium: 3.8 mmol/L (ref 3.5–5.3)
Sodium: 137 mmol/L (ref 135–146)
Total Bilirubin: 0.7 mg/dL (ref 0.2–1.2)
Total Protein: 6.4 g/dL (ref 6.1–8.1)

## 2022-11-05 LAB — AFP TUMOR MARKER: AFP-Tumor Marker: 3.8 ng/mL

## 2022-11-05 LAB — INFLIXIMAB LEVEL AND ADA FOR IBD
Infliximab ADA, IBD: <10 AU (ref <10)
Infliximab Level, IBD: 1.6 ug/mL

## 2022-11-05 LAB — PROTIME-INR
INR: 1.5 — ABNORMAL HIGH
Prothrombin Time: 15.6 s — ABNORMAL HIGH (ref 9.0–11.5)

## 2022-11-05 LAB — C-REACTIVE PROTEIN: CRP: 3.7 mg/L (ref <8.0)

## 2022-11-06 ENCOUNTER — Other Ambulatory Visit: Payer: Self-pay | Admitting: *Deleted

## 2022-11-07 LAB — C. DIFFICILE GDH AND TOXIN A/B
GDH ANTIGEN: NOT DETECTED
MICRO NUMBER:: 14525901
SPECIMEN QUALITY:: ADEQUATE
TOXIN A AND B: NOT DETECTED

## 2022-11-07 LAB — GASTROINTESTINAL PATHOGEN PNL
CampyloBacter Group: NOT DETECTED
Norovirus GI/GII: NOT DETECTED
Rotavirus A: NOT DETECTED
Salmonella species: NOT DETECTED
Shiga Toxin 1: NOT DETECTED
Shiga Toxin 2: NOT DETECTED
Shigella Species: NOT DETECTED
Vibrio Group: NOT DETECTED
Yersinia enterocolitica: NOT DETECTED

## 2022-11-07 NOTE — Telephone Encounter (Signed)
Left pt a detailed message letting her know that I have faxed over Dr. Jacalyn Lefevre office note, preop clearance, over to [place].

## 2022-11-07 NOTE — Progress Notes (Signed)
Order for dose change faxed to [facility] rheumatology

## 2022-11-07 NOTE — Telephone Encounter (Signed)
Patient calling in to say that update needs to be sent to he office. To say rather she is cleared or not. Please advise

## 2022-11-14 ENCOUNTER — Encounter (INDEPENDENT_AMBULATORY_CARE_PROVIDER_SITE_OTHER): Payer: Self-pay

## 2022-11-14 DIAGNOSIS — A09 Infectious gastroenteritis and colitis, unspecified: Secondary | ICD-10-CM | POA: Insufficient documentation

## 2022-11-14 DIAGNOSIS — K529 Noninfective gastroenteritis and colitis, unspecified: Secondary | ICD-10-CM | POA: Insufficient documentation

## 2022-11-19 DIAGNOSIS — M48062 Spinal stenosis, lumbar region with neurogenic claudication: Secondary | ICD-10-CM | POA: Diagnosis not present

## 2022-11-21 ENCOUNTER — Other Ambulatory Visit: Payer: Self-pay | Admitting: Family Medicine

## 2022-11-21 ENCOUNTER — Ambulatory Visit (HOSPITAL_COMMUNITY): Admission: RE | Admit: 2022-11-21 | Payer: Medicare PPO | Source: Ambulatory Visit

## 2022-11-23 ENCOUNTER — Other Ambulatory Visit: Payer: Self-pay | Admitting: Family Medicine

## 2022-11-24 ENCOUNTER — Other Ambulatory Visit: Payer: Self-pay | Admitting: Family Medicine

## 2022-11-28 ENCOUNTER — Encounter: Payer: Self-pay | Admitting: Family Medicine

## 2022-11-28 ENCOUNTER — Ambulatory Visit (INDEPENDENT_AMBULATORY_CARE_PROVIDER_SITE_OTHER): Payer: Medicare PPO | Admitting: Family Medicine

## 2022-11-28 VITALS — BP 117/72 | HR 63 | Temp 98.1°F | Ht 60.63 in | Wt 216.4 lb

## 2022-11-28 DIAGNOSIS — D509 Iron deficiency anemia, unspecified: Secondary | ICD-10-CM

## 2022-11-28 DIAGNOSIS — E1142 Type 2 diabetes mellitus with diabetic polyneuropathy: Secondary | ICD-10-CM

## 2022-11-28 DIAGNOSIS — Z0000 Encounter for general adult medical examination without abnormal findings: Secondary | ICD-10-CM | POA: Diagnosis not present

## 2022-11-28 DIAGNOSIS — K760 Fatty (change of) liver, not elsewhere classified: Secondary | ICD-10-CM | POA: Diagnosis not present

## 2022-11-28 LAB — COMPREHENSIVE METABOLIC PANEL
ALT: 37 U/L — ABNORMAL HIGH (ref 0–35)
AST: 45 U/L — ABNORMAL HIGH (ref 0–37)
Albumin: 3.2 g/dL — ABNORMAL LOW (ref 3.5–5.2)
Alkaline Phosphatase: 109 U/L (ref 39–117)
BUN: 13 mg/dL (ref 6–23)
CO2: 32 mEq/L (ref 19–32)
Calcium: 8.8 mg/dL (ref 8.4–10.5)
Chloride: 103 mEq/L (ref 96–112)
Creatinine, Ser: 0.7 mg/dL (ref 0.40–1.20)
GFR: 84.69 mL/min (ref >60.00)
Glucose, Bld: 140 mg/dL — ABNORMAL HIGH (ref 70–99)
Potassium: 3.2 mEq/L — ABNORMAL LOW (ref 3.5–5.1)
Sodium: 142 mEq/L (ref 135–145)
Total Bilirubin: 0.7 mg/dL (ref 0.2–1.2)
Total Protein: 6.7 g/dL (ref 6.0–8.3)

## 2022-11-28 LAB — CBC
HCT: 38.5 % (ref 36.0–46.0)
Hemoglobin: 13.2 g/dL (ref 12.0–15.0)
MCHC: 34.2 g/dL (ref 30.0–36.0)
MCV: 101.1 fl — ABNORMAL HIGH (ref 78.0–100.0)
Platelets: 121 10*3/uL — ABNORMAL LOW (ref 150.0–400.0)
RBC: 3.81 Mil/uL — ABNORMAL LOW (ref 3.87–5.11)
RDW: 13.8 % (ref 11.5–15.5)
WBC: 4.1 10*3/uL (ref 4.0–10.5)

## 2022-11-28 LAB — POCT GLYCOSYLATED HEMOGLOBIN (HGB A1C)
HbA1c POC (<> result, manual entry): 6.5 % (ref 4.0–5.6)
HbA1c, POC (controlled diabetic range): 6.5 % (ref 0.0–7.0)
HbA1c, POC (prediabetic range): 6.5 % — AB (ref 5.7–6.4)
Hemoglobin A1C: 6.5 % — AB (ref 4.0–5.6)

## 2022-11-28 MED ORDER — POTASSIUM CHLORIDE CRYS ER 10 MEQ PO TBCR: 10.0000 meq | EXTENDED_RELEASE_TABLET | Freq: Every day | ORAL | 1 refills | Status: DC

## 2022-11-28 MED ORDER — FUROSEMIDE 40 MG PO TABS: 40.0000 mg | ORAL_TABLET | Freq: Every day | ORAL | 1 refills | Status: DC

## 2022-11-28 NOTE — Patient Instructions (Signed)

## 2022-11-28 NOTE — Progress Notes (Signed)
Office Note 11/28/2022  CC:  Chief Complaint  Patient presents with   Annual Exam   HPI:  Patient is a 77 y.o. female who is here for annual health maintenance exam and 77-monthfollow up diabetes.  Doing pretty well lately. She got an injection in her back and this helped her pain quite a bit.  She continues to take Tylenol 1000 mg typically twice a day.  No bleeding lately at all. She does not recall the CBGs--she left her paper in the car. Due to some hypoglycemic episodes she has stopped giving insulin at supper as well as at bedtime. She currently gives herself U-500, 100 units at breakfast and 100 units at lunch. She does continue taking Invokana and Trulicity as prescribed.  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 ? to get liver bx if she gets bariatric surgery? Mild portal hypertensive gastropathy on EGD 08/2019.   Dysrhythmia    Fibromyalgia    GAD (generalized anxiety disorder)    GERD    History of hiatal hernia    History of iron deficiency anemia 12/2018   Inadequate absorption secondary to chronic/long term PPI therapy + portal hypertensive gastroduodenopathy. No GIB found on EGD, colonosc, and givens. Iron infusions X multiple.   History of thrombocytopenia 12/2011   Hyperlipidemia    Intolerant of statins   HYPERTENSION    IBS (irritable bowel syndrome)    -D.  Good response to bentyl and imodium as of 06/2018 GI f/u.   IDDM (insulin dependent diabetes mellitus)    with DPN (managed by Dr. GCruzita Ledererbut then in 2018 pt preferred to have me manage for her convenience)   Limited mobility    Requires a walker for arthritic pain, widespread musculoskeletal pain, and neuropathic pain.   Morbid obesity ([place]    As of 11/2018, pt considering sleeve gastectomy vs bipass as of eval by Dr. NDe Burrsconsidering  as of 12/2019.   Nonalcoholic steatohepatitis    Viral Hep screens NEG.  CT 2015.  Transaminasemia.  U/S 07/2018 showed early changes of cirrhosis.   OSA (obstructive sleep apnea) 09/14/2015   sleep study 09/07/15: severe obstructive sleep apnea with an AHI of 72 and SaO2 low of 75%.>referred to sleep MD   Osteoarthritis    hips, shoulders, knees   PAF (paroxysmal atrial fibrillation) ([facility]    One documented episode (after getting EGD 2016).  Was on amiodarone x 3 mo.  Rate control with metoprolol + anticoag with xarelto.    Recurrent epistaxis    Granuloma in L nare cauterized by ENT 04/2020. Another cautery 06/2020   Small fiber neuropathy    Due to DM.  Symmetric hands and feet tingling/numbness.   Ulcerative colitis ([place]    Remicade infusion Q 8 weeks: in clinical and endoscopic remission as of 12/2018 GI f/u.  06/11/19 rpt colonoscopy->cecal and ascending colon colitis.    Past Surgical History:  Procedure Laterality Date   ABDOMINAL HYSTERECTOMY  1980   Paps no longer indicated.   BACK SURGERY     BACTERIAL OVERGROWTH TEST N/A 07/13/2015   Procedure: BACTERIAL OVERGROWTH TEST;  Surgeon: NRogene Houston MD;  [facility];  Service: Endoscopy;  Laterality: N/A;  730     BILATERAL SALPINGOOPHORECTOMY  02/10/2001   BIOPSY  06/11/2019   Procedure: BIOPSY;  Surgeon: RHildred Laser  U, MD;  [facility];  Service: Endoscopy;;  colon   BIOPSY  09/27/2019   Procedure: BIOPSY;  Surgeon: Rogene Houston, MD;  [facility];  Service: Endoscopy;;  gastric duodenal   BIOPSY  09/15/2020   Procedure: BIOPSY;  Surgeon: Harvel Quale, MD;  [facility];  Service: Gastroenterology;;   BREAST REDUCTION SURGERY  1994   bilat   BREAST SURGERY     CARDIOVASCULAR STRESS TEST  07/2010   Lexiscan myoview: normal   CARPAL TUNNEL RELEASE Right 1996   CARPAL TUNNEL RELEASE Left 03/21/2003   CARPAL TUNNEL RELEASE Right 05/04/2013   Procedure:  CARPAL TUNNEL RELEASE;  Surgeon: Cammie Sickle., MD;  Location: [facility];  Service: Orthopedics;  Laterality: Right;   CARPAL TUNNEL RELEASE Left 09/21/2013   Procedure: LEFT CARPAL TUNNEL RELEASE;  Surgeon: Cammie Sickle., MD;  Location: [place];  Service: Orthopedics;  Laterality: Left;   CHOLECYSTECTOMY     COLONOSCOPY WITH PROPOFOL N/A 08/04/2015   Colitis in remission.  No polyps.  Procedure: COLONOSCOPY WITH PROPOFOL;  Surgeon: Rogene Houston, MD;  [facility];  Service: Endoscopy;  Laterality: N/A;  cecum time in  0820   time out  0827    total time 7 minutes   COLONOSCOPY WITH PROPOFOL N/A 06/11/2019   cecal and ascending colon colitis.  Procedure: COLONOSCOPY WITH PROPOFOL;  Surgeon: Rogene Houston, MD;  [facility];  Service: Endoscopy;  Laterality: N/A;  730a   COLONOSCOPY WITH PROPOFOL N/A 09/15/2020   Procedure: COLONOSCOPY WITH PROPOFOL;  Surgeon: Harvel Quale, MD;  [facility];  Service: Gastroenterology;  Laterality: N/A;  1030   ESOPHAGEAL DILATION N/A 08/04/2015   Procedure: ESOPHAGEAL DILATION;  Surgeon: Rogene Houston, MD;  [facility];  Service: Endoscopy;  Laterality: N/AKelvin Cellar, no mucousal disruption   ESOPHAGOGASTRODUODENOSCOPY  09/27/2019   Performed for IDA.  Esoph dilation was done but no stricture present.  Mild portal hypertensive gastropathy, o/w normal.  Duodenal bx NEG.  h pylori neg.   ESOPHAGOGASTRODUODENOSCOPY (EGD) WITH ESOPHAGEAL DILATION  12/02/2005   ESOPHAGOGASTRODUODENOSCOPY (EGD) WITH PROPOFOL N/A 08/04/2015   Procedure: ESOPHAGOGASTRODUODENOSCOPY (EGD) WITH PROPOFOL;  Surgeon: Rogene Houston, MD;  [facility];  Service: Endoscopy;  Laterality: N/A;  procedure 1   ESOPHAGOGASTRODUODENOSCOPY (EGD) WITH PROPOFOL N/A 09/27/2019   Procedure: ESOPHAGOGASTRODUODENOSCOPY (EGD) WITH PROPOFOL;  Surgeon: Rogene Houston, MD;  [facility];   Service: Endoscopy;  Laterality: N/A;  12:10   ESOPHAGOGASTRODUODENOSCOPY (EGD) WITH PROPOFOL N/A 10/31/2021   Procedure: ESOPHAGOGASTRODUODENOSCOPY (EGD) WITH PROPOFOL;  Surgeon: Rogene Houston, MD;  [facility];  Service: Endoscopy;  Laterality: N/A;  [place]  01/17/2012   Procedure: FLEXIBLE SIGMOIDOSCOPY;  Surgeon: Rogene Houston, MD;  [facility];  Service: Endoscopy;  Laterality: N/A;   GIVENS CAPSULE STUDY N/A 08/03/2019   Procedure: GIVENS CAPSULE STUDY (performed for IDA)->some food debris in stomach and small amount of blood.  Surgeon: Rogene Houston, MD;  [facility];  Service: Endoscopy;  Laterality: N/A;  730AM   HEMILAMINOTOMY LUMBAR SPINE Bilateral 09/07/1999   L4-5   KNEE ARTHROSCOPY Right 01/1999; 10/2000   LYSIS OF ADHESION  02/10/2001   MALONEY DILATION  09/27/2019   Procedure: Venia Minks DILATION;  Surgeon: Rogene Houston, MD;  [facility];  Service: Endoscopy;;  NASAL ENDOSCOPY WITH EPISTAXIS CONTROL Bilateral 01/25/2022   Procedure: NASAL ENDOSCOPY WITH EPISTAXIS CONTROL;  Surgeon: Jerrell Belfast, MD;  Location: [place];  Service: ENT;  Laterality: Bilateral;   NASAL SEPTOPLASTY W/ TURBINOPLASTY Bilateral 01/25/2022   Procedure: NASAL SEPTOPLASTY WITH TURBINATE REDUCTION;  Surgeon: Jerrell Belfast, MD;  Location: [place];  Service: ENT;  Laterality: Bilateral;   [place]; 09/12/2006   TARSAL TUNNEL RELEASE  2002   TRANSTHORACIC ECHOCARDIOGRAM  08/04/2015   EF 60-65%, normal wall motion, mild LVH, mild LA dilation, grd I DD.   TUMOR EXCISION Left 03/21/2003   dorsal 1st web space (hand)   URETEROLYSIS Right 02/10/2001    Family History  Problem Relation Age of Onset   Diabetes Mother    Hypertension Mother    Heart attack Father        Mid 42's   Heart disease Father    Lung disease Father        spot on lung; had lung surgery   Alcohol abuse Other    Hypertension Son     Diabetes Son    Emphysema Maternal Grandfather    Asthma Maternal Grandfather    Colon cancer Paternal Grandfather    Stomach cancer Paternal Grandmother    Neuropathy Neg Hx     Social History   Socioeconomic History   Marital status: Married    Spouse name: Not on file   Number of children: Not on file   Years of education: Not on file   Highest education level: Not on file  Occupational History   Occupation: Retired  Tobacco Use   Smoking status: Never   Smokeless tobacco: Never  Vaping Use   Vaping Use: Never used  Substance and Sexual Activity   Alcohol use: Not Currently    Comment: ocassionally   Drug use: No   Sexual activity: Not Currently    Partners: Male    Birth control/protection: Surgical    Comment: hysterectomy  Other Topics Concern   Not on file  Social History Narrative   She lives with husband in [place] home.  They have one grown son and 2 grandchildren.   She is retired [employer].   Highest of level education:  Some college.   Never smoker.   Alcohol: rare.   Social Determinants of Health   Financial Resource Strain: Medium Risk (11/29/2020)   Overall Financial Resource Strain (CARDIA)    Difficulty of Paying Living Expenses: Somewhat hard  Food Insecurity: No Food Insecurity (10/09/2022)   Hunger Vital Sign    Worried About Running Out of Food in the Last Year: Never true    Ran Out of Food in the Last Year: Never true  Transportation Needs: No Transportation Needs (10/09/2022)   PRAPARE - Hydrologist (Medical): No    Lack of Transportation (Non-Medical): No  Physical Activity: Inactive (11/29/2020)   Exercise Vital Sign    Days of Exercise per Week: 0 days    Minutes of Exercise per Session: 0 min  Stress: No Stress Concern Present (12/10/2021)   [place]    Feeling of Stress : Not at all  Social Connections: Moderately Isolated  (12/11/2021)   Social Connection and Isolation Panel [NHANES]    Frequency of Communication with Friends and Family: More than three times a week    Frequency of Social Gatherings with Friends and Family: More than three times a  week    Attends Religious Services: Never    Active Member of Clubs or Organizations: No    Attends Archivist Meetings: Never    Marital Status: Married  Human resources officer Violence: Not At Risk (10/09/2022)   Humiliation, Afraid, Rape, and Kick questionnaire    Fear of Current or Ex-Partner: No    Emotionally Abused: No    Physically Abused: No    Sexually Abused: No    Outpatient Medications Prior to Visit  Medication Sig Dispense Refill   ACCU-CHEK GUIDE test strip USE TO CHECK BLOOD GLUCOSE UP TO 6 TIMES DAILY AS DIRECTED 100 strip 3   Accu-Chek Softclix Lancets lancets USE TO CHECK BLOOD GLUCOSE UP TO 6 TIMES DAILY AS DIRECTED 200 each 1   acetaminophen (TYLENOL) 325 MG tablet Take 650 mg by mouth every 6 (six) hours as needed for moderate pain or headache.     amitriptyline (ELAVIL) 25 MG tablet Take 25 mg by mouth in the morning and at bedtime.     blood glucose meter kit and supplies KIT Use up to six times daily as directed. DX. E11.9 1 each 0   canagliflozin (INVOKANA) 100 MG TABS tablet TAKE 1 TABLET BY MOUTH EVERY DAY BEFORE BREAKFAST 90 tablet 1   citalopram (CELEXA) 20 MG tablet TAKE 1 TABLET BY MOUTH EVERY DAY 90 tablet 1   dicyclomine (BENTYL) 10 MG capsule TAKE 1 CAPSULE (10 MG TOTAL) BY MOUTH EVERY 8 (EIGHT) HOURS AS NEEDED FOR SPASMS (ABDOMINAL PAIN). 270 capsule 1   Dulaglutide (TRULICITY) 1.5 0000000 SOPN INJECT 1.'5MG'$  ONCE WEEKLY 18 mL 3   esomeprazole (NEXIUM) 20 MG capsule TAKE 1 CAPSULE BY MOUTH DAILY BEFORE BREAKFAST 90 capsule 3   gabapentin (NEURONTIN) 600 MG tablet TAKE 1 AND 1/2 TABLETS BY MOUTH TWICE A DAY 270 tablet 1   INFLIXIMAB IV Inject into the vein every 8 (eight) weeks. Remicaid '5mg'$ /kg Infusion every 8 weeks [facility])     Insulin Pen Needle (B-D UF III MINI PEN NEEDLES) 31G X 5 MM MISC USE TO INJECT INSULINS EQUAL TO 6 TIMES DAILY. 600 each 5   insulin regular human CONCENTRATED (HUMULIN R U-500 KWIKPEN) 500 UNIT/ML KwikPen DIAL AND INJECT 100 UNITS UNDER THE SKIN WITH BREAKFAST AND DINNER AND 45 UNITS IN EVENING. (Patient taking differently: DIAL AND INJECT 25 UNITS UNDER THE SKIN WITH BREAKFAST AND DINNER AND 25 UNITS IN EVENING.) 48 mL 0   lactase (LACTAID) 3000 UNITS tablet Take 3,000 Units by mouth as needed (when eating foods containing dairy).      loperamide (IMODIUM) 2 MG capsule Take 1 capsule (2 mg total) by mouth 2 (two) times daily as needed for diarrhea or loose stools. 90 capsule 5   meclizine (ANTIVERT) 25 MG tablet Take 1 tablet (25 mg total) by mouth 3 (three) times daily as needed for dizziness or nausea. 90 tablet 0   methocarbamol (ROBAXIN) 500 MG tablet Take 1 tablet (500 mg total) by mouth 3 (three) times daily. May cause drowsiness 21 tablet 0   metoprolol succinate (TOPROL-XL) 100 MG 24 hr tablet TAKE 1 TABLET BY MOUTH 2 TIMES DAILY. TAKE WITH OR IMMEDIATELY FOLLOWING A MEAL 180 tablet 1   Pediatric Multivitamins-Iron (FLINTSTONES COMPLETE) 18 MG CHEW Chew 1 tablet by mouth 2 (two) times daily.     rivaroxaban (XARELTO) 20 MG TABS tablet Take 1 tablet (20 mg total) by mouth daily with supper. 30 tablet 5   ferric carboxymaltose (INJECTAFER) 750  MG/15ML SOLN injection Inject 15 mLs (750 mg total) into the vein as directed for 1 dose. Repeat in 7 days (Patient not taking: Reported on 11/28/2022) 15 mL 1   furosemide (LASIX) 40 MG tablet TAKE 1 TABLET BY MOUTH EVERY DAY 90 tablet 0   potassium chloride (KLOR-CON M10) 10 MEQ tablet Take 1 tablet (10 mEq total) by mouth daily. 180 tablet 0   No facility-administered medications prior to visit.    Allergies  Allergen Reactions   Omeprazole Anaphylaxis and Swelling    SWELLING OF TONGUE AND THROAT   Actos  [Pioglitazone] Swelling and Other (See Comments)    Weight gain, tongue swelling    Benzocaine-Menthol Swelling    SWELLING OF MOUTH   Colesevelam Other (See Comments)    GI UPSET   Flagyl [Metronidazole Hcl] Other (See Comments)    DIAPHORESIS   Metformin And Related Diarrhea   Shrimp [Shellfish Allergy] Itching    OF THROAT AND EARS, if consumed raw   Statins Palpitations   Allevyn Adhesive [Wound Dressings]     Other reaction(s): Unknown   Desipramine Hcl Itching, Nausea Only and Other (See Comments)    "swimmy" headed, ears itched    Hydromorphone Itching   Jardiance [Empagliflozin] Other (See Comments)    weakness   Lactose Intolerance (Gi) Diarrhea    Gas, bloating   Adhesive [Tape] Other (See Comments)    SKIN IRRITATION AND BRUISING   Nisoldipine Itching   Percocet [Oxycodone-Acetaminophen] Itching    Review of Systems  Constitutional:  Negative for appetite change, chills, fatigue and fever.  HENT:  Negative for congestion, dental problem, ear pain and sore throat.   Eyes:  Negative for discharge, redness and visual disturbance.  Respiratory:  Negative for cough, chest tightness, shortness of breath and wheezing.   Cardiovascular:  Negative for chest pain, palpitations and leg swelling.  Gastrointestinal:  Negative for abdominal pain, blood in stool, diarrhea, nausea and vomiting.  Genitourinary:  Negative for difficulty urinating, dysuria, flank pain, frequency, hematuria and urgency.  Musculoskeletal:  Negative for arthralgias, back pain, joint swelling, myalgias and neck stiffness.  Skin:  Negative for pallor and rash.  Neurological:  Negative for dizziness, speech difficulty, weakness and headaches.  Hematological:  Negative for adenopathy. Does not bruise/bleed easily.  Psychiatric/Behavioral:  Negative for confusion and sleep disturbance. The patient is not nervous/anxious.     PE;    11/28/2022    1:14 PM 11/28/2022    1:06 PM 11/04/2022    1:36 PM   Vitals with BMI  Height  5' 0.63" '5\' 2"'$   Weight  216 lbs 6 oz 219 lbs 10 oz  BMI  [id number]  Systolic [id number] [id number]  Diastolic 72 74 71  Pulse  63 65   Exam chaperoned by Deveron Furlong, CMA. Gen: Alert, well appearing.  Patient is oriented to person, place, time, and situation. AFFECT: pleasant, lucid thought and speech. ENT: Ears: EACs clear, normal epithelium.  TMs with good light reflex and landmarks bilaterally.  Eyes: no injection, icteris, swelling, or exudate.  EOMI, PERRLA. Nose: no drainage or turbinate edema/swelling.  No injection or focal lesion.  Mouth: lips without lesion/swelling.  Oral mucosa pink and moist.  Dentition intact and without obvious caries or gingival swelling.  Oropharynx without erythema, exudate, or swelling.  Neck: supple/nontender.  No LAD, mass, or TM.  Carotid pulses 2+ bilaterally, without bruits. CV: RRR, no m/r/g.   LUNGS: CTA bilat, nonlabored resps, good aeration in all  lung fields. ABD: soft, NT, ND, BS normal.  No hepatospenomegaly or mass.  No bruits. EXT: no clubbing, cyanosis, or edema.  Musculoskeletal: no joint swelling, erythema, warmth, or tenderness.  ROM of all joints intact. Skin - no sores or suspicious lesions or rashes or color changes  Pertinent labs:  Lab Results  Component Value Date   TSH 0.618 10/09/2022   Lab Results  Component Value Date   WBC 6.3 10/10/2022   HGB 11.8 (L) 10/10/2022   HCT 34.5 (L) 10/10/2022   MCV 99.7 10/10/2022   PLT 119 (L) 10/10/2022   Lab Results  Component Value Date   IRON 129 08/28/2022   TIBC 243 (L) 08/28/2022   FERRITIN 333 (H) 08/28/2022    Lab Results  Component Value Date   CREATININE 0.71 10/30/2022   BUN 9 10/30/2022   NA 137 10/30/2022   K 3.8 10/30/2022   CL 101 10/30/2022   CO2 27 10/30/2022   Lab Results  Component Value Date   ALT 38 (H) 10/30/2022   AST 54 (H) 10/30/2022   ALKPHOS 142 (H) 10/09/2022   BILITOT 0.7 10/30/2022   Lab Results  Component  Value Date   CHOL 190 08/28/2022   Lab Results  Component Value Date   HDL 41.10 08/28/2022   Lab Results  Component Value Date   LDLCALC 121 (H) 08/28/2022   Lab Results  Component Value Date   TRIG 139.0 08/28/2022   Lab Results  Component Value Date   CHOLHDL 5 08/28/2022   Lab Results  Component Value Date   HGBA1C 6.1 (A) 08/28/2022   HGBA1C 6.1 08/28/2022   HGBA1C 6.1 08/28/2022   HGBA1C 6.1 08/28/2022   ASSESSMENT AND PLAN:   #1 health maintenance exam: Reviewed age and gender appropriate health maintenance issues (prudent diet, regular exercise, health risks of tobacco and excessive alcohol, use of seatbelts, fire alarms in home, use of sunscreen).  Also reviewed age and gender appropriate health screening as well as vaccine recommendations. Vaccines: All up-to-date Labs: cbc,cmet, poc A1c today Breast ca screening: GYN MD Hale Bogus has ordered a mammogram 05/2022. Colon ca screening: last colonoscopy 08/2020  #2 diabetes with peripheral neuropathy. POC Hba1c today is 6.5%. Continue Trulicity, Invokana, and U-500 (100 U qAM and 100 U lunch, none at supper or bedtime). Will help her get Dexcom.  An After Visit Summary was printed and given to the patient.  FOLLOW UP:  Return in about 3 months (around 02/26/2023) for routine chronic illness f/u.  Signed:  Crissie Sickles, MD           11/28/2022

## 2022-11-29 ENCOUNTER — Other Ambulatory Visit: Payer: Self-pay

## 2022-11-29 DIAGNOSIS — G4733 Obstructive sleep apnea (adult) (pediatric): Secondary | ICD-10-CM | POA: Diagnosis not present

## 2022-11-29 MED ORDER — POTASSIUM CHLORIDE CRYS ER 10 MEQ PO TBCR: 10.0000 meq | EXTENDED_RELEASE_TABLET | Freq: Two times a day (BID) | ORAL | 1 refills | Status: DC

## 2022-12-03 ENCOUNTER — Telehealth: Payer: Self-pay

## 2022-12-03 NOTE — Telephone Encounter (Signed)
Pt requested new order for Dexcom at last visit 11/28/22, order placed and pending approval from [facility].

## 2022-12-08 ENCOUNTER — Other Ambulatory Visit: Payer: Self-pay | Admitting: Family Medicine

## 2022-12-10 ENCOUNTER — Telehealth (INDEPENDENT_AMBULATORY_CARE_PROVIDER_SITE_OTHER): Payer: Self-pay | Admitting: *Deleted

## 2022-12-10 ENCOUNTER — Other Ambulatory Visit (INDEPENDENT_AMBULATORY_CARE_PROVIDER_SITE_OTHER): Payer: Self-pay | Admitting: Gastroenterology

## 2022-12-10 DIAGNOSIS — K5100 Ulcerative (chronic) pancolitis without complications: Secondary | ICD-10-CM

## 2022-12-10 DIAGNOSIS — S32050A Wedge compression fracture of fifth lumbar vertebra, initial encounter for closed fracture: Secondary | ICD-10-CM | POA: Diagnosis not present

## 2022-12-10 DIAGNOSIS — M48062 Spinal stenosis, lumbar region with neurogenic claudication: Secondary | ICD-10-CM | POA: Diagnosis not present

## 2022-12-10 MED ORDER — DIPHENOXYLATE-ATROPINE 2.5-0.025 MG PO TABS: 1.0000 | ORAL_TABLET | Freq: Four times a day (QID) | ORAL | 2 refills | Status: DC | PRN

## 2022-12-10 NOTE — Telephone Encounter (Signed)
Hopefully the higher dose infliximab will control her disease better and decrease her diarrhea. I'll send Lomotil for now, she should stop Imodium while taking Lomotil.

## 2022-12-10 NOTE — Telephone Encounter (Signed)
I called patient to let her know that infusion center would not change her order for remacaid from every 8 weeks to every 6 weeks but her insurance did approve an increase in dose from '5mg'$  to '10mg'$  and she would get increase dose at next infusion on march 28th. Patient wanted to know if she could take something extra for UC. Reports she cannot leave house without worrying about having an accident. Has about 3 stools per day. Reports imodium did not help.

## 2022-12-11 NOTE — Telephone Encounter (Signed)
Discussed with patient per Dr. Jenetta Downer - Hopefully the higher dose infliximab will control her disease better and decrease her diarrhea. I'll send Lomotil for now, she should stop Imodium while taking Lomotil.  Patient verbalized understanding.

## 2022-12-11 NOTE — Telephone Encounter (Signed)
Tried to call no answer

## 2022-12-12 DIAGNOSIS — E1142 Type 2 diabetes mellitus with diabetic polyneuropathy: Secondary | ICD-10-CM | POA: Diagnosis not present

## 2022-12-16 ENCOUNTER — Encounter: Payer: Self-pay | Admitting: Cardiovascular Disease

## 2022-12-16 ENCOUNTER — Ambulatory Visit (INDEPENDENT_AMBULATORY_CARE_PROVIDER_SITE_OTHER): Payer: Medicare PPO | Admitting: Gastroenterology

## 2022-12-16 ENCOUNTER — Ambulatory Visit (INDEPENDENT_AMBULATORY_CARE_PROVIDER_SITE_OTHER): Payer: Medicare PPO | Admitting: Cardiovascular Disease

## 2022-12-16 VITALS — BP 134/68 | HR 69 | Ht 62.0 in | Wt 221.2 lb

## 2022-12-16 DIAGNOSIS — I480 Paroxysmal atrial fibrillation: Secondary | ICD-10-CM

## 2022-12-16 DIAGNOSIS — Z01810 Encounter for preprocedural cardiovascular examination: Secondary | ICD-10-CM

## 2022-12-16 NOTE — Progress Notes (Signed)
Watchman Consult Note   Date:  12/16/2022   ID:  [name], [name] 03/12/1946, MRN [id number]  PCP:  Tammi Sou, MD  Cardiologist:  Dr Stanford Breed Primary Electrophysiologist: none Referring Physician: Dr Stanford Breed   CC: to discuss Watchman implant    History of Present Illness: [name] is a 77 y.o. female referred by Dr Stanford Breed for evaluation of atrial fibrillation and stroke prevention. She has paroxysmal atrial fibrillation.  The patient has been evaluated by their referring physician and is felt to be a poor candidate for long term [place] due to epistaxis.  She therefore presents today for Watchman evaluation.   The patient is here with her husband today.  She reports that she has had problems with epistaxis now for quite a while.  She has been able to continue on rivaroxaban, but has almost daily issues with nosebleeding.  She has been evaluated by ENT, has undergone procedural treatment, but continues to have difficulty. Today, she denies symptoms of palpitations, chest pain, shortness of breath, orthopnea, PND, lower extremity edema, claudication, dizziness, presyncope, syncope, bleeding, or neurologic sequela. The patient is tolerating medications without difficulties and is otherwise without complaint today.    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 ? to get liver bx if she gets bariatric surgery? Mild portal hypertensive gastropathy on EGD 08/2019.   Dysrhythmia    Fibromyalgia    GAD (generalized anxiety disorder)    GERD    History of hiatal hernia    History of iron deficiency anemia 12/2018   Inadequate absorption secondary to chronic/long term PPI therapy + portal hypertensive gastroduodenopathy. No GIB found on EGD, colonosc, and givens. Iron infusions X multiple.   History of thrombocytopenia 12/2011    Hyperlipidemia    Intolerant of statins   HYPERTENSION    IBS (irritable bowel syndrome)    -D.  Good response to bentyl and imodium as of 06/2018 GI f/u.   IDDM (insulin dependent diabetes mellitus)    with DPN (managed by Dr. Cruzita Lederer but then in 2018 pt preferred to have me manage for her convenience)   Limited mobility    Requires a walker for arthritic pain, widespread musculoskeletal pain, and neuropathic pain.   Morbid obesity ([place])    As of 11/2018, pt considering sleeve gastectomy vs bipass as of eval by Dr. De Burrs considering as of 12/2019.   Nonalcoholic steatohepatitis    Viral Hep screens NEG.  CT 2015.  Transaminasemia.  U/S 07/2018 showed early changes of cirrhosis.   OSA (obstructive sleep apnea) 09/14/2015   sleep study 09/07/15: severe obstructive sleep apnea with an AHI of 72 and SaO2 low of 75%.>referred to sleep MD   Osteoarthritis    hips, shoulders, knees   PAF (paroxysmal atrial fibrillation) ([place])    One documented episode (after getting EGD 2016).  Was on amiodarone x 3 mo.  Rate control with metoprolol + anticoag with xarelto.    Recurrent epistaxis    Granuloma in L nare cauterized by ENT 04/2020. Another cautery 06/2020   Small fiber neuropathy    Due to DM.  Symmetric hands and feet tingling/numbness.   Ulcerative colitis ([place])    Remicade infusion Q 8 weeks: in clinical and endoscopic remission as of 12/2018 GI f/u.  06/11/19 rpt colonoscopy->cecal and ascending colon colitis.   Past  Surgical History:  Procedure Laterality Date   ABDOMINAL HYSTERECTOMY  1980   Paps no longer indicated.   BACK SURGERY     BACTERIAL OVERGROWTH TEST N/A 07/13/2015   Procedure: BACTERIAL OVERGROWTH TEST;  Surgeon: Rogene Houston, MD;  [facility];  Service: Endoscopy;  Laterality: N/A;  730     BILATERAL SALPINGOOPHORECTOMY  02/10/2001   BIOPSY  06/11/2019   Procedure: BIOPSY;  Surgeon: Rogene Houston, MD;  [facility];  Service: Endoscopy;;   colon   BIOPSY  09/27/2019   Procedure: BIOPSY;  Surgeon: Rogene Houston, MD;  [facility];  Service: Endoscopy;;  gastric duodenal   BIOPSY  09/15/2020   Procedure: BIOPSY;  Surgeon: Harvel Quale, MD;  [facility];  Service: Gastroenterology;;   BREAST REDUCTION SURGERY  1994   bilat   BREAST SURGERY     CARDIOVASCULAR STRESS TEST  07/2010   Lexiscan myoview: normal   CARPAL TUNNEL RELEASE Right 1996   CARPAL TUNNEL RELEASE Left 03/21/2003   CARPAL TUNNEL RELEASE Right 05/04/2013   Procedure: CARPAL TUNNEL RELEASE;  Surgeon: Cammie Sickle., MD;  Location: [place];  Service: Orthopedics;  Laterality: Right;   CARPAL TUNNEL RELEASE Left 09/21/2013   Procedure: LEFT CARPAL TUNNEL RELEASE;  Surgeon: Cammie Sickle., MD;  Location: [place];  Service: Orthopedics;  Laterality: Left;   CHOLECYSTECTOMY     COLONOSCOPY WITH PROPOFOL N/A 08/04/2015   Colitis in remission.  No polyps.  Procedure: COLONOSCOPY WITH PROPOFOL;  Surgeon: Rogene Houston, MD;  [facility];  Service: Endoscopy;  Laterality: N/A;  cecum time in  0820   time out  0827    total time 7 minutes   COLONOSCOPY WITH PROPOFOL N/A 06/11/2019   cecal and ascending colon colitis.  Procedure: COLONOSCOPY WITH PROPOFOL;  Surgeon: Rogene Houston, MD;  [facility];  Service: Endoscopy;  Laterality: N/A;  730a   COLONOSCOPY WITH PROPOFOL N/A 09/15/2020   Procedure: COLONOSCOPY WITH PROPOFOL;  Surgeon: Harvel Quale, MD;  [facility];  Service: Gastroenterology;  Laterality: N/A;  1030   ESOPHAGEAL DILATION N/A 08/04/2015   Procedure: ESOPHAGEAL DILATION;  Surgeon: Rogene Houston, MD;  [facility];  Service: Endoscopy;  Laterality: N/AKelvin Cellar, no mucousal disruption   ESOPHAGOGASTRODUODENOSCOPY  09/27/2019   Performed for IDA.  Esoph dilation was done but no stricture present.  Mild portal hypertensive  gastropathy, o/w normal.  Duodenal bx NEG.  h pylori neg.   ESOPHAGOGASTRODUODENOSCOPY (EGD) WITH ESOPHAGEAL DILATION  12/02/2005   ESOPHAGOGASTRODUODENOSCOPY (EGD) WITH PROPOFOL N/A 08/04/2015   Procedure: ESOPHAGOGASTRODUODENOSCOPY (EGD) WITH PROPOFOL;  Surgeon: Rogene Houston, MD;  [facility];  Service: Endoscopy;  Laterality: N/A;  procedure 1   ESOPHAGOGASTRODUODENOSCOPY (EGD) WITH PROPOFOL N/A 09/27/2019   Procedure: ESOPHAGOGASTRODUODENOSCOPY (EGD) WITH PROPOFOL;  Surgeon: Rogene Houston, MD;  [facility];  Service: Endoscopy;  Laterality: N/A;  12:10   ESOPHAGOGASTRODUODENOSCOPY (EGD) WITH PROPOFOL N/A 10/31/2021   Procedure: ESOPHAGOGASTRODUODENOSCOPY (EGD) WITH PROPOFOL;  Surgeon: Rogene Houston, MD;  [facility];  Service: Endoscopy;  Laterality: N/A;  [place]  01/17/2012   Procedure: FLEXIBLE SIGMOIDOSCOPY;  Surgeon: Rogene Houston, MD;  [facility];  Service: Endoscopy;  Laterality: N/A;   GIVENS CAPSULE STUDY N/A 08/03/2019   Procedure: GIVENS CAPSULE STUDY (performed for IDA)->some food  debris in stomach and small amount of blood.  Surgeon: Rogene Houston, MD;  [facility];  Service: Endoscopy;  Laterality: N/A;  730AM   HEMILAMINOTOMY LUMBAR SPINE Bilateral 09/07/1999   L4-5   KNEE ARTHROSCOPY Right 01/1999; 10/2000   LYSIS OF ADHESION  02/10/2001   MALONEY DILATION  09/27/2019   Procedure: MALONEY DILATION;  Surgeon: Rogene Houston, MD;  [facility];  Service: Endoscopy;;   NASAL ENDOSCOPY WITH EPISTAXIS CONTROL Bilateral 01/25/2022   Procedure: NASAL ENDOSCOPY WITH EPISTAXIS CONTROL;  Surgeon: Jerrell Belfast, MD;  Location: [place];  Service: ENT;  Laterality: Bilateral;   NASAL SEPTOPLASTY W/ TURBINOPLASTY Bilateral 01/25/2022   Procedure: NASAL SEPTOPLASTY WITH TURBINATE REDUCTION;  Surgeon: Jerrell Belfast, MD;  Location: [place];  Service: ENT;  Laterality: Bilateral;   [place]; 09/12/2006   TARSAL TUNNEL RELEASE  2002   TRANSTHORACIC ECHOCARDIOGRAM  08/04/2015   EF 60-65%, normal wall motion, mild LVH, mild LA dilation, grd I DD.   TUMOR EXCISION Left 03/21/2003   dorsal 1st web space (hand)   URETEROLYSIS Right 02/10/2001     Current Outpatient Medications  Medication Sig Dispense Refill   ACCU-CHEK GUIDE test strip USE TO CHECK BLOOD GLUCOSE UP TO 6 TIMES DAILY AS DIRECTED 100 strip 3   Accu-Chek Softclix Lancets lancets USE TO CHECK BLOOD GLUCOSE UP TO 6 TIMES DAILY AS DIRECTED 200 each 1   acetaminophen (TYLENOL) 325 MG tablet Take 650 mg by mouth every 6 (six) hours as needed for moderate pain or headache.     amitriptyline (ELAVIL) 25 MG tablet Take 25 mg by mouth in the morning and at bedtime.     blood glucose meter kit and supplies KIT Use up to six times daily as directed. DX. E11.9 1 each 0   canagliflozin (INVOKANA) 100 MG TABS tablet TAKE 1 TABLET BY MOUTH EVERY DAY BEFORE BREAKFAST 90 tablet 1   citalopram (CELEXA) 20 MG tablet TAKE 1 TABLET BY MOUTH EVERY DAY 90 tablet 1   dicyclomine (BENTYL) 10 MG capsule TAKE 1 CAPSULE (10 MG TOTAL) BY MOUTH EVERY 8 (EIGHT) HOURS AS NEEDED FOR SPASMS (ABDOMINAL PAIN). 270 capsule 1   diphenoxylate-atropine (LOMOTIL) 2.5-0.025 MG tablet Take 1 tablet by mouth 4 (four) times daily as needed for diarrhea or loose stools. 60 tablet 2   Dulaglutide (TRULICITY) 1.5 0000000 SOPN INJECT 1.5MG  ONCE WEEKLY 18 mL 3   esomeprazole (NEXIUM) 20 MG capsule TAKE 1 CAPSULE BY MOUTH DAILY BEFORE BREAKFAST 90 capsule 3   ferric carboxymaltose (INJECTAFER) 750 MG/15ML SOLN injection Inject 15 mLs (750 mg total) into the vein as directed for 1 dose. Repeat in 7 days 15 mL 1   furosemide (LASIX) 40 MG tablet Take 1 tablet (40 mg total) by mouth daily. 90 tablet 1   gabapentin (NEURONTIN) 600 MG tablet TAKE 1 AND 1/2 TABLETS BY MOUTH TWICE A DAY 270 tablet 1   INFLIXIMAB IV Inject into the vein every 8 (eight) weeks.  Remicaid 5mg /kg Infusion every 8 weeks [facility])     Insulin Pen Needle (B-D UF III MINI PEN NEEDLES) 31G X 5 MM MISC USE TO INJECT INSULINS EQUAL TO 6 TIMES DAILY. 600 each 5   insulin regular human CONCENTRATED (HUMULIN R U-500 KWIKPEN) 500 UNIT/ML KwikPen DIAL AND INJECT 100 UNITS UNDER THE SKIN WITH BREAKFAST AND DINNER AND 45 UNITS IN EVENING. (Patient taking differently: DIAL AND INJECT 25 UNITS UNDER THE SKIN WITH  BREAKFAST AND DINNER AND 25 UNITS IN EVENING.) 48 mL 0   lactase (LACTAID) 3000 UNITS tablet Take 3,000 Units by mouth as needed (when eating foods containing dairy).      meclizine (ANTIVERT) 25 MG tablet Take 1 tablet (25 mg total) by mouth 3 (three) times daily as needed for dizziness or nausea. 90 tablet 0   methocarbamol (ROBAXIN) 500 MG tablet Take 1 tablet (500 mg total) by mouth 3 (three) times daily. May cause drowsiness 21 tablet 0   metoprolol succinate (TOPROL-XL) 100 MG 24 hr tablet TAKE 1 TABLET BY MOUTH 2 TIMES DAILY. TAKE WITH OR IMMEDIATELY FOLLOWING A MEAL 180 tablet 1   Pediatric Multivitamins-Iron (FLINTSTONES COMPLETE) 18 MG CHEW Chew 1 tablet by mouth 2 (two) times daily.     potassium chloride (KLOR-CON) 10 MEQ tablet Take 10 mEq by mouth daily. Pt takes 1 tablet 10 MEQ once a day.     rivaroxaban (XARELTO) 20 MG TABS tablet Take 1 tablet (20 mg total) by mouth daily with supper. 30 tablet 5   No current facility-administered medications for this visit.    Allergies:   Omeprazole, Actos [pioglitazone], Benzocaine-menthol, Colesevelam, Flagyl [metronidazole hcl], Metformin and related, Shrimp [shellfish allergy], Statins, Allevyn adhesive [wound dressings], Desipramine hcl, Hydromorphone, Jardiance [empagliflozin], Lactose intolerance (gi), Adhesive [tape], Nisoldipine, and Percocet [oxycodone-acetaminophen]   Social History:  The patient  reports that she has never smoked. She has never used smokeless tobacco. She reports that she does not  currently use alcohol. She reports that she does not use drugs.   Family History:  The patient's  family history includes Alcohol abuse in an other family member; Asthma in her maternal grandfather; Colon cancer in her paternal grandfather; Diabetes in her mother and son; Emphysema in her maternal grandfather; Heart attack in her father; Heart disease in her father; Hypertension in her mother and son; Lung disease in her father; Stomach cancer in her paternal grandmother.    ROS:  Please see the history of present illness.   All other systems are reviewed and negative.    PHYSICAL EXAM: VS:  BP 134/68   Pulse 69   Ht 5\' 2"  (1.575 m)   Wt 221 lb 3.2 oz (100.3 kg)   SpO2 98%   BMI 40.46 kg/m  , BMI Body mass index is 40.46 kg/m. GEN: Well nourished, well developed, obese woman in no acute distress  HEENT: normal  Neck: no JVD, carotid bruits, or masses Cardiac: RRR; no murmurs, rubs, or gallops,no edema  Respiratory:  clear to auscultation bilaterally, normal work of breathing GI: soft, nontender, nondistended, + BS MS: no deformity or atrophy  Skin: warm and dry  Neuro:  Strength and sensation are intact Psych: euthymic mood, full affect  EKG:  EKG is not ordered today.   Recent Labs: 10/09/2022: Magnesium 2.2; TSH 0.618 11/28/2022: ALT 37; BUN 13; Creatinine, Ser 0.70; Hemoglobin 13.2; Platelets 121.0; Potassium 3.2; Sodium 142    Lipid Panel     Component Value Date/Time   CHOL 190 08/28/2022 1019   TRIG 139.0 08/28/2022 1019   TRIG 196 01/26/2010 0000   HDL 41.10 08/28/2022 1019   CHOLHDL 5 08/28/2022 1019   VLDL 27.8 08/28/2022 1019   LDLCALC 121 (H) 08/28/2022 1019   LDLDIRECT 69.5 01/24/2011 1125     Wt Readings from Last 3 Encounters:  12/16/22 221 lb 3.2 oz (100.3 kg)  11/28/22 216 lb 6.4 oz (98.2 kg)  11/04/22 219 lb 9.6 oz (99.6 kg)  Other studies Reviewed: Additional studies/ records that were reviewed today include:  Cardiac Studies & Procedures        ECHOCARDIOGRAM  ECHOCARDIOGRAM COMPLETE 08/04/2015  Narrative *[place],  [id number]  ------------------------------------------------------------------- Transthoracic Echocardiography  Patient:    Yoona, Ballowe MR #:       [id number] Study Date: 08/04/2015 Gender:     F Age:        30 Height:     157.5 cm Weight:     95.7 kg BSA:        2.1 m^2 Pt. Status: Room:       IC08  ADMITTING    Nelle Don REFERRING    Lendon Colonel ATTENDING    Kathie Dike [id number] SONOGRAPHER  Leavy Cella PERFORMING   Chmg, Forestine Na  cc:  ------------------------------------------------------------------- LV EF: 60% -   65%  ------------------------------------------------------------------- Indications:      Atrial fibrillation - 427.31.  ------------------------------------------------------------------- History:   PMH:  Palpitations.  Atrial fibrillation.  Risk factors: Hypertension. Diabetes mellitus. Dyslipidemia.  ------------------------------------------------------------------- Study Conclusions  - Left ventricle: The cavity size was normal. Wall thickness was increased in a pattern of moderate LVH. Systolic function was normal. The estimated ejection fraction was in the range of 60% to 65%. Wall motion was normal; there were no regional wall motion abnormalities. Doppler parameters are consistent with abnormal left ventricular relaxation (grade 1 diastolic dysfunction). - Aortic valve: Moderately calcified annulus. Trileaflet; mildly thickened leaflets. Valve area (VTI): 2.21 cm^2. Valve area (Vmax): 2.1 cm^2. - Mitral valve: Mildly calcified annulus. Mildly thickened leaflets . - Left atrium: The atrium was mildly dilated. - Technically adequate study.  Transthoracic echocardiography.  M-mode, complete 2D, spectral Doppler, and color Doppler.   Birthdate:  Patient birthdate: 1946-06-05.  Age:  Patient is 77 yr old.  Sex:  Gender: female. BMI: 38.6 kg/m^2.  Blood pressure:     128/64  Patient status: Inpatient.  Study date:  Study date: 08/04/2015. Study time: 02:56 PM.  Location:  Bedside.  -------------------------------------------------------------------  ------------------------------------------------------------------- Left ventricle:  The cavity size was normal. Wall thickness was increased in a pattern of moderate LVH. Systolic function was normal. The estimated ejection fraction was in the range of 60% to 65%. Wall motion was normal; there were no regional wall motion abnormalities. Doppler parameters are consistent with abnormal left ventricular relaxation (grade 1 diastolic dysfunction).  ------------------------------------------------------------------- Aortic valve:   Moderately calcified annulus. Trileaflet; mildly thickened leaflets.  Doppler:   There was no stenosis.   There was no significant regurgitation.    VTI ratio of LVOT to aortic valve: 0.58. Valve area (VTI): 2.21 cm^2. Indexed valve area (VTI): 1.05 cm^2/m^2. Peak velocity ratio of LVOT to aortic valve: 0.55. Valve area (Vmax): 2.1 cm^2. Indexed valve area (Vmax): 1 cm^2/m^2. Mean gradient (S): 6 mm Hg. Peak gradient (S): 11 mm Hg.  ------------------------------------------------------------------- Aorta:  Aortic root: The aortic root was normal in size.  ------------------------------------------------------------------- Mitral valve:   Mildly calcified annulus. Mildly thickened leaflets .  Doppler:   There was no evidence for stenosis.   There was no significant regurgitation.    Peak gradient (D): 3 mm Hg.  ------------------------------------------------------------------- Left atrium:  The atrium was mildly dilated.  ------------------------------------------------------------------- Atrial septum:  Poorly  visualized.  ------------------------------------------------------------------- Right ventricle:  The cavity size was normal. Systolic function was normal.  ------------------------------------------------------------------- Pulmonic valve:   Not well  visualized.  Doppler:   There was no evidence for stenosis.   There was no significant regurgitation.  ------------------------------------------------------------------- Tricuspid valve:   Normal thickness leaflets.  Doppler:   There was no evidence for stenosis.   There was no significant regurgitation.  ------------------------------------------------------------------- Pulmonary artery:    Systolic pressure could not be accurately estimated.   Inadequate TR jet.  ------------------------------------------------------------------- Right atrium:  The atrium was normal in size.  ------------------------------------------------------------------- Pericardium:  There was no pericardial effusion.  ------------------------------------------------------------------- Systemic veins: Inferior vena cava: The vessel was normal in size. The respirophasic diameter changes were in the normal range (>= 50%), consistent with normal central venous pressure.  ------------------------------------------------------------------- Measurements  Left ventricle                           Value           Reference LV ID, ED, PLAX chordal          (L)     41.9   mm       43 - 52 LV ID, ES, PLAX chordal                  28.7   mm       23 - 38 LV fx shortening, PLAX chordal           32     %        >=29 LV PW thickness, ED                      14.6   mm       --------- IVS/LV PW ratio, ED                      0.94            <=1.3  Ventricular septum                       Value           Reference IVS thickness, ED                        13.7   mm       ---------  LVOT                                     Value           Reference LVOT ID, S                                22     mm       --------- LVOT area                                3.8    cm^2     --------- LVOT peak velocity, S                    91.32  cm/s     --------- LVOT VTI, S  23.79  cm       ---------  Aortic valve                             Value           Reference Aortic valve peak velocity, S            165.6  cm/s     --------- Aortic valve mean velocity, S            114.06 cm/s     --------- Aortic valve VTI, S                      40.96  cm       --------- Aortic mean gradient, S                  6      mm Hg    --------- Aortic peak gradient, S                  11     mm Hg    --------- VTI ratio, LVOT/AV                       0.58            --------- Aortic valve area, VTI                   2.21   cm^2     --------- Aortic valve area/bsa, VTI               1.05   cm^2/m^2 --------- Velocity ratio, peak, LVOT/AV            0.55            --------- Aortic valve area, peak velocity         2.1    cm^2     --------- Aortic valve area/bsa, peak              1      cm^2/m^2 --------- velocity  Aorta                                    Value           Reference Aortic root ID, ED                       33     mm       ---------  Left atrium                              Value           Reference LA ID, A-P, ES                           38     mm       --------- LA volume/bsa, ES, 1-p A4C               29.9   ml/m^2   ---------  Mitral valve  Value           Reference Mitral E-wave peak velocity              88.8   cm/s     --------- Mitral A-wave peak velocity              116    cm/s     --------- Mitral deceleration time                 229    ms       150 - 230 Mitral peak gradient, D                  3      mm Hg    --------- Mitral E/A ratio, peak                   0.8             ---------  Right ventricle                          Value           Reference TAPSE                                    23      mm       ---------  Legend: (L)  and  (H)  mark values outside specified reference range.  ------------------------------------------------------------------- Prepared and Electronically Authenticated by  Kerry Hough, M.D. 2016-11-04T15:30:18                ASSESSMENT AND PLAN:  1.  Paroxysmal atrial fibrillation I have seen DYSTANY SHUEMAKE is a 77 y.o. female in the office today who has been referred for a Watchman left atrial appendage closure device.  She has a history of paroxysmal atrial fibrillation.  The patients atrial fibrillation-related risk scores are outlined below:   CHA2DS2-VASc Score = 5   This indicates a 7.2% annual risk of stroke. The patient's score is based upon: CHF History: 0 HTN History: 1 Diabetes History: 1 Stroke History: 0 Vascular Disease History: 0 Age Score: 2 Gender Score: 1      HAS-BLED score = 2 Hypertension No  Abnormal renal and liver function (Dialysis, transplant, Cr >2.26 mg/dL /Cirrhosis or Bilirubin >2x Normal or AST/ALT/AP >3x Normal) No  Stroke No  Bleeding Yes  Labile INR (Unstable/high INR) No  Elderly (>65) Yes  Drugs or alcohol (? 8 drinks/week, anti-plt or NSAID) No   The patient is not felt to be a good long term anticoagulation candidate secondary to epistaxis.  As an alternative to lon-term oral anticoagulation, a device strategy with left atrial appendage occlusion is an appropriate consideration. The patients chart has been reviewed and I along with their referring cardiologist feel that they would be a candidate for short term oral anticoagulation.  Procedural risks for the Watchman implant have been reviewed with the patient, including but not limited to a <1% risk of stroke, <1% risk of perforation or pericardial effusion, 0.1% risk of device embolization.  Given the patient's poor candidacy for long-term oral anticoagulation, ability to tolerate short term oral anticoagulation, it is appropriate to pursue the  Watchman FLX left atrial appendage closure device through a shared decision making conversation with the patient.  After our shared decision-making discussion  today, the patient is not certain how she would like to proceed.  However, she would like to move forward with evaluation which will include a gated CTA of the heart to assess her anatomy.  She thinks this will help her decide on whether to proceed pending anatomic assessment.  The procedure will be performed for PV/LA visualization to assess left atrial appendage anatomy and suitability for Watchman FLX implantation. Once imaging studies are performed I will review to confirm the patient continues to be a suitable candidate for Watchman implant. If so, we will schedule for the implant procedure at the first available date if she decides she would like to proceed. I would likely pursue a post-procedural anticoagulation strategy of Xarelto 20 mg daily through 6 weeks then clopidogrel monotherapy through 6 months.   Current medicines are reviewed at length with the patient today.   The patient does not have concerns regarding her medicines.  The following changes were made today:  none  Labs/ tests ordered today include:  Orders Placed This Encounter  Procedures   CT CARDIAC MORPH/PULM VEIN W/CM&W/O CA SCORE   Basic metabolic panel     Signed, Sherren Mocha, MD 12/16/2022  5:56 PM     [facility] [address] [address] [phone] (office) [phone] (fax)

## 2022-12-16 NOTE — Patient Instructions (Addendum)
Medication Instructions:  Your physician recommends that you continue on your current medications as directed. Please refer to the Current Medication list given to you today.  *If you need a refill on your cardiac medications before your next appointment, please call your pharmacy*  Lab Work: BMET today If you have labs (blood work) drawn today and your tests are completely normal, you will receive your results only by: [name] (if you have MyChart) OR A paper copy in the mail If you have any lab test that is abnormal or we need to change your treatment, we will call you to review the results.  Testing/Procedures: Watchman CT scan Your physician has requested that you have cardiac CT. Cardiac computed tomography (CT) is a painless test that uses an x-ray machine to take clear, detailed pictures of your heart. For further information please visit [email or web address]. Please follow instruction sheet as given.  Follow-Up: At [facility], you and your health needs are our priority.  As part of our continuing mission to provide you with exceptional heart care, we have created designated Provider Care Teams.  These Care Teams include your primary Cardiologist (physician) and Advanced Practice Providers (APPs -  Physician Assistants and Nurse Practitioners) who all work together to provide you with the care you need, when you need it.  We recommend signing up for the patient portal called "MyChart".  Sign up information is provided on this After Visit Summary.  MyChart is used to connect with patients for Virtual Visits (Telemedicine).  Patients are able to view lab/test results, encounter notes, upcoming appointments, etc.  Non-urgent messages can be sent to your provider as well.   To learn more about what you can do with MyChart, go to [email or web address].    Your next appointment:   Structural Team will follow-up  Provider:   Sherren Mocha, MD    Other  Instructions WATCHMAN CT INSTRUCTIONS: Your WATCHMAN CT will be scheduled at [place] will be called to confirm date and time.  The day of your CT appointment, please enter [name] through the [facility] (Entrance [address] off [facility] You may use the FREE valet parking offered at entrance C (encouraged to control the heart rate for the test). Check in at the large round desk through the glass doors 30 minutes prior to your scheduled appointment.   Please follow these instructions carefully:  On the Night Before the Test: Be sure to Drink plenty of water. Do not consume any caffeinated/decaffeinated beverages or chocolate 12 hours prior to your test. Do not take any antihistamines 12 hours prior to your test.  On the Day of the Test: You may take your regular medications prior to the test.  HOLD Furosemide morning of the test. FEMALES- please wear underwire-free bra if available      After the Test: Drink plenty of water. After receiving IV contrast, you may experience a mild flushed feeling. This is normal. On occasion, you may experience a mild rash up to 24 hours after the test. This is not dangerous. If this occurs, you can take Benadryl 25 mg and increase your fluid intake. If you experience trouble breathing, this can be serious. If it is severe call 911 IMMEDIATELY. If it is mild, please call our office. If you take any of these medications: Glipizide/Metformin, Avandament, Glucavance, please do not take 48 hours after completing test unless otherwise instructed.  Once we have confirmed authorization from your insurance company, you will  be called to set up a date and time for your test.   For non-scheduling related questions/concerns about your CT scan, please contact the cardiac imaging nurses: [name], Cardiac Imaging Nurse Navigator [name], Cardiac Imaging Nurse Navigator [facility] Direct  Office Dial: [phone]   For scheduling needs, including cancellations and rescheduling, please call [name], [phone].  [name], the Watchman Nurse Navigator, will call you after your CT once the [facility] Team has reviewed your imaging for an update on proceedings. [name]'s direct number is [phone] if you need assistance.

## 2022-12-17 ENCOUNTER — Encounter (HOSPITAL_COMMUNITY): Payer: Self-pay

## 2022-12-17 ENCOUNTER — Ambulatory Visit (HOSPITAL_COMMUNITY)
Admission: RE | Admit: 2022-12-17 | Discharge: 2022-12-17 | Disposition: A | Discharge status: Home / Self Care | Payer: Medicare PPO | Source: Ambulatory Visit | Attending: Family Medicine | Admitting: Family Medicine

## 2022-12-17 DIAGNOSIS — M47816 Spondylosis without myelopathy or radiculopathy, lumbar region: Secondary | ICD-10-CM

## 2022-12-17 DIAGNOSIS — M5441 Lumbago with sciatica, right side: Secondary | ICD-10-CM

## 2022-12-17 DIAGNOSIS — D849 Immunodeficiency, unspecified: Secondary | ICD-10-CM

## 2022-12-17 DIAGNOSIS — N319 Neuromuscular dysfunction of bladder, unspecified: Secondary | ICD-10-CM

## 2022-12-17 LAB — BASIC METABOLIC PANEL
BUN/Creatinine Ratio: 23 (ref 12–28)
BUN: 15 mg/dL (ref 8–27)
CO2: 23 mmol/L (ref 20–29)
Calcium: 9.1 mg/dL (ref 8.7–10.3)
Chloride: 101 mmol/L (ref 96–106)
Creatinine, Ser: 0.64 mg/dL (ref 0.57–1.00)
Glucose: 232 mg/dL — ABNORMAL HIGH (ref 70–99)
Potassium: 4.3 mmol/L (ref 3.5–5.2)
Sodium: 139 mmol/L (ref 134–144)
eGFR: 92 mL/min/{1.73_m2} (ref >59)

## 2022-12-23 ENCOUNTER — Telehealth (HOSPITAL_COMMUNITY): Payer: Self-pay | Admitting: *Deleted

## 2022-12-23 NOTE — Telephone Encounter (Signed)
Reaching out to patient to offer assistance regarding upcoming cardiac imaging study; pt verbalizes understanding of appt date/time, parking situation and where to check in, pre-test NPO status  and verified current allergies; name and call back number provided for further questions should they arise  Makaria Poarch RN Navigator Cardiac Imaging [facility] [phone] office [phone] cell  Patient aware to arrive at 1:30pm.  

## 2022-12-24 ENCOUNTER — Ambulatory Visit (HOSPITAL_COMMUNITY)
Admission: RE | Admit: 2022-12-24 | Discharge: 2022-12-24 | Disposition: A | Discharge status: Home / Self Care | Payer: Medicare PPO | Source: Ambulatory Visit | Attending: Cardiovascular Disease | Admitting: Cardiovascular Disease

## 2022-12-24 DIAGNOSIS — I480 Paroxysmal atrial fibrillation: Secondary | ICD-10-CM

## 2022-12-24 MED ORDER — IOHEXOL 350 MG/ML SOLN
100.0000 mL | Freq: Once | INTRAVENOUS | Status: AC | PRN
  Administered 2022-12-24: 100 mL via INTRAVENOUS

## 2022-12-26 DIAGNOSIS — K5180 Other ulcerative colitis without complications: Secondary | ICD-10-CM | POA: Diagnosis not present

## 2022-12-30 ENCOUNTER — Telehealth: Payer: Self-pay

## 2022-12-31 NOTE — Telephone Encounter (Signed)
Patient with favorable anatomy for Watchman implantation if she would like to proceed. Otherwise see recent consult note. thx

## 2023-01-01 ENCOUNTER — Telehealth: Payer: Self-pay

## 2023-01-01 NOTE — Telephone Encounter (Signed)
See 12/30/2022 encounter. Per Dr. Burt Knack, [place] to proceed with Watchman implant. Called to discuss with patient.  One number has voicemail that is full. Left message to call back on second number.

## 2023-01-02 NOTE — Telephone Encounter (Signed)
Discussed CT results with patient and she wishes to proceed with Watchman implant on 01/23/2023. Pre-procedure visit scheduled 01/08/2023. She was grateful for call and agreed with plan.

## 2023-01-03 ENCOUNTER — Other Ambulatory Visit: Payer: Self-pay

## 2023-01-03 DIAGNOSIS — I480 Paroxysmal atrial fibrillation: Secondary | ICD-10-CM

## 2023-01-07 NOTE — H&P (View-Only) (Signed)
[facility]                                     Cardiology Office Note:    Date:  01/09/2023   ID:  [name], DOB 1946-07-04, MRN [id number]  PCP:  Jennifer Massed, MD  [facility] Cardiologist:  Jennifer Millers, MD / Dr. Excell Seltzer, MD (LAAO) [facility] Electrophysiologist:  None   Referring MD: Jennifer Massed, MD   Chief Complaint  Patient presents with   Follow-up    Pre LAAO   History of Present Illness:    [name] is a 77 y.o. female with a hx of PAF with hx of epistaxis while taking DOAC who was referred to Dr. Excell [name] for LAAO closure with Watchman evaluation.    [name] follows with Dr. Jens [name] for her cardiology care and reported daily nosebleeds while taking Xarelto for her atrial fibrillation. In consult with Dr. Excell [name] she was interested and underwent pre procedure CT imagining that showed anatomy suitable for 31mm Watchman FLX device. She is now scheduled for her procedure 01/22/23 and is here today for pre-operative evaluation.   She is here with her husband and states that she had two nosebleeds prior to coming to the office today. She is very frustrated with this and is ready for her procedure. She denies chest pain, palpitations, LE edema, dizziness, syncope, or bleeding in stool or urine.   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 ? to get liver bx if she gets bariatric surgery? Mild portal hypertensive gastropathy on EGD 08/2019.   Dysrhythmia    Fibromyalgia    GAD (generalized anxiety disorder)    GERD    History of hiatal hernia    History of iron deficiency anemia 12/2018   Inadequate absorption secondary to chronic/long term PPI therapy + portal hypertensive gastroduodenopathy. No GIB found on EGD, colonosc, and givens. Iron infusions X multiple.   History of thrombocytopenia  12/2011   Hyperlipidemia    Intolerant of statins   HYPERTENSION    IBS (irritable bowel syndrome)    -D.  Good response to bentyl and imodium as of 06/2018 GI f/u.   IDDM (insulin dependent diabetes mellitus)    with DPN (managed by Dr. Elvera [name] but then in 2018 pt preferred to have me manage for her convenience)   Limited mobility    Requires a walker for arthritic pain, widespread musculoskeletal pain, and neuropathic pain.   Morbid obesity    As of 11/2018, pt considering sleeve gastectomy vs bipass as of eval by Dr. Alvino [name] considering as of 12/2019.   Nonalcoholic steatohepatitis    Viral Hep screens NEG.  CT 2015.  Transaminasemia.  U/S 07/2018 showed early changes of cirrhosis.   OSA (obstructive sleep apnea) 09/14/2015   sleep study 09/07/15: severe obstructive sleep apnea with an AHI of 72 and SaO2 low of 75%.>referred to sleep MD   Osteoarthritis    hips, shoulders, knees   PAF (paroxysmal atrial fibrillation)    One documented episode (after getting EGD 2016).  Was on amiodarone x 3 mo.  Rate control with metoprolol + anticoag with xarelto.    Recurrent epistaxis    Granuloma in L nare cauterized by ENT 04/2020. Another cautery  06/2020   Small fiber neuropathy    Due to DM.  Symmetric hands and feet tingling/numbness.   Ulcerative colitis    Remicade infusion Q 8 weeks: in clinical and endoscopic remission as of 12/2018 GI f/u.  06/11/19 rpt colonoscopy->cecal and ascending colon colitis.    Past Surgical History:  Procedure Laterality Date   ABDOMINAL HYSTERECTOMY  1980   Paps no longer indicated.   BACK SURGERY     BACTERIAL OVERGROWTH TEST N/A 07/13/2015   Procedure: BACTERIAL OVERGROWTH TEST;  Surgeon: Jennifer Hippo, MD;  [facility];  Service: Endoscopy;  Laterality: N/A;  730     BILATERAL SALPINGOOPHORECTOMY  02/10/2001   BIOPSY  06/11/2019   Procedure: BIOPSY;  Surgeon: Jennifer Hippo, MD;  [facility];  Service: Endoscopy;;  colon    BIOPSY  09/27/2019   Procedure: BIOPSY;  Surgeon: Jennifer Hippo, MD;  [facility];  Service: Endoscopy;;  gastric duodenal   BIOPSY  09/15/2020   Procedure: BIOPSY;  Surgeon: Jennifer Frame, MD;  [facility];  Service: Gastroenterology;;   BREAST REDUCTION SURGERY  1994   bilat   BREAST SURGERY     CARDIOVASCULAR STRESS TEST  07/2010   Lexiscan myoview: normal   CARPAL TUNNEL RELEASE Right 1996   CARPAL TUNNEL RELEASE Left 03/21/2003   CARPAL TUNNEL RELEASE Right 05/04/2013   Procedure: CARPAL TUNNEL RELEASE;  Surgeon: [name]., MD;  Location: [facility];  Service: Orthopedics;  Laterality: Right;   CARPAL TUNNEL RELEASE Left 09/21/2013   Procedure: LEFT CARPAL TUNNEL RELEASE;  Surgeon: [name]., MD;  Location: [facility];  Service: Orthopedics;  Laterality: Left;   CHOLECYSTECTOMY     COLONOSCOPY WITH PROPOFOL N/A 08/04/2015   Colitis in remission.  No polyps.  Procedure: COLONOSCOPY WITH PROPOFOL;  Surgeon: Jennifer Hippo, MD;  [facility];  Service: Endoscopy;  Laterality: N/A;  cecum time in  0820   time out  0827    total time 7 minutes   COLONOSCOPY WITH PROPOFOL N/A 06/11/2019   cecal and ascending colon colitis.  Procedure: COLONOSCOPY WITH PROPOFOL;  Surgeon: Jennifer Hippo, MD;  [facility];  Service: Endoscopy;  Laterality: N/A;  730a   COLONOSCOPY WITH PROPOFOL N/A 09/15/2020   Procedure: COLONOSCOPY WITH PROPOFOL;  Surgeon: Jennifer Frame, MD;  [facility];  Service: Gastroenterology;  Laterality: N/A;  1030   ESOPHAGEAL DILATION N/A 08/04/2015   Procedure: ESOPHAGEAL DILATION;  Surgeon: Jennifer Hippo, MD;  [facility];  Service: Endoscopy;  Laterality: N/ALauralee [name], no mucousal disruption   ESOPHAGOGASTRODUODENOSCOPY  09/27/2019   Performed for IDA.  Esoph dilation was done but no stricture present.  Mild portal hypertensive  gastropathy, o/w normal.  Duodenal bx NEG.  h pylori neg.   ESOPHAGOGASTRODUODENOSCOPY (EGD) WITH ESOPHAGEAL DILATION  12/02/2005   ESOPHAGOGASTRODUODENOSCOPY (EGD) WITH PROPOFOL N/A 08/04/2015   Procedure: ESOPHAGOGASTRODUODENOSCOPY (EGD) WITH PROPOFOL;  Surgeon: Jennifer Hippo, MD;  [facility];  Service: Endoscopy;  Laterality: N/A;  procedure 1   ESOPHAGOGASTRODUODENOSCOPY (EGD) WITH PROPOFOL N/A 09/27/2019   Procedure: ESOPHAGOGASTRODUODENOSCOPY (EGD) WITH PROPOFOL;  Surgeon: Jennifer Hippo, MD;  [facility];  Service: Endoscopy;  Laterality: N/A;  12:10   ESOPHAGOGASTRODUODENOSCOPY (EGD) WITH PROPOFOL N/A 10/31/2021   Procedure: ESOPHAGOGASTRODUODENOSCOPY (EGD) WITH PROPOFOL;  Surgeon: Jennifer Hippo, MD;  [facility];  Service: Endoscopy;  Laterality: N/A;  930   FLEXIBLE SIGMOIDOSCOPY  01/17/2012   Procedure: FLEXIBLE SIGMOIDOSCOPY;  Surgeon: Jennifer Hippo, MD;  [facility];  Service: Endoscopy;  Laterality: N/A;   GIVENS CAPSULE STUDY N/A 08/03/2019   Procedure: GIVENS CAPSULE STUDY (performed for IDA)->some food debris in stomach and small amount of [name].  Surgeon: Jennifer Hippo, MD;  [facility];  Service: Endoscopy;  Laterality: N/A;  730AM   HEMILAMINOTOMY LUMBAR SPINE Bilateral 09/07/1999   L4-5   KNEE ARTHROSCOPY Right 01/1999; 10/2000   LYSIS OF ADHESION  02/10/2001   MALONEY DILATION  09/27/2019   Procedure: MALONEY DILATION;  Surgeon: Jennifer Hippo, MD;  [facility];  Service: Endoscopy;;   NASAL ENDOSCOPY WITH EPISTAXIS CONTROL Bilateral 01/25/2022   Procedure: NASAL ENDOSCOPY WITH EPISTAXIS CONTROL;  Surgeon: Osborn Coho, MD;  Location[facility];  Service: ENT;  Laterality: Bilateral;   NASAL SEPTOPLASTY W/ TURBINOPLASTY Bilateral 01/25/2022   Procedure: NASAL SEPTOPLASTY WITH TURBINATE REDUCTION;  Surgeon: Osborn Coho, MD;  Location: [facility];  Service: ENT;  Laterality: Bilateral;   RECTOCELE  REPAIR  1990; 09/12/2006   TARSAL TUNNEL RELEASE  2002   TRANSTHORACIC ECHOCARDIOGRAM  08/04/2015   EF 60-65%, normal wall motion, mild LVH, mild LA dilation, grd I DD.   TUMOR EXCISION Left 03/21/2003   dorsal 1st web space (hand)   URETEROLYSIS Right 02/10/2001    Current Medications: Current Meds  Medication Sig   ACCU-CHEK GUIDE test strip USE TO CHECK [name] GLUCOSE UP TO 6 TIMES DAILY AS DIRECTED   Accu-Chek Softclix Lancets lancets USE TO CHECK [name] GLUCOSE UP TO 6 TIMES DAILY AS DIRECTED   acetaminophen (TYLENOL) 325 MG tablet Take 650 mg by mouth every 6 (six) hours as needed for moderate pain or headache.   amitriptyline (ELAVIL) 25 MG tablet Take 25 mg by mouth in the morning and at bedtime.   [name] glucose meter kit and supplies KIT Use up to six times daily as directed. DX. E11.9   canagliflozin (INVOKANA) 100 MG TABS tablet TAKE 1 TABLET BY MOUTH EVERY DAY BEFORE BREAKFAST   citalopram (CELEXA) 20 MG tablet TAKE 1 TABLET BY MOUTH EVERY DAY   dicyclomine (BENTYL) 10 MG capsule TAKE 1 CAPSULE (10 MG TOTAL) BY MOUTH EVERY 8 (EIGHT) HOURS AS NEEDED FOR SPASMS (ABDOMINAL PAIN).   diphenoxylate-atropine (LOMOTIL) 2.5-0.025 MG tablet Take 1 tablet by mouth 4 (four) times daily as needed for diarrhea or loose stools.   Dulaglutide (TRULICITY) 1.5 MG/0.5ML SOPN INJECT 1.5MG  ONCE WEEKLY   esomeprazole (NEXIUM) 20 MG capsule TAKE 1 CAPSULE BY MOUTH DAILY BEFORE BREAKFAST   ferric carboxymaltose (INJECTAFER) 750 MG/15ML SOLN injection Inject 15 mLs (750 mg total) into the vein as directed for 1 dose. Repeat in 7 days   furosemide (LASIX) 40 MG tablet Take 1 tablet (40 mg total) by mouth daily.   gabapentin (NEURONTIN) 600 MG tablet TAKE 1 AND 1/2 TABLETS BY MOUTH TWICE A DAY   INFLIXIMAB IV Inject into the vein every 8 (eight) weeks. Remicaid /kg Infusion every 8 weeks [facility])   Insulin Pen Needle (B-D UF III MINI PEN NEEDLES) 31G X 5 MM MISC USE TO INJECT INSULINS  EQUAL TO 6 TIMES DAILY.   insulin regular human CONCENTRATED (HUMULIN R U-500 KWIKPEN) 500 UNIT/ML KwikPen DIAL AND INJECT 100 UNITS UNDER THE SKIN WITH BREAKFAST AND DINNER AND 45 UNITS IN EVENING. (Patient taking differently: DIAL AND INJECT 25  UNITS UNDER THE SKIN WITH BREAKFAST AND DINNER AND 25 UNITS IN EVENING.)   lactase (LACTAID) 3000 UNITS tablet Take 3,000 Units by mouth as needed (when eating foods containing dairy).    meclizine (ANTIVERT) 25 MG tablet Take 1 tablet (25 mg total) by mouth 3 (three) times daily as needed for dizziness or nausea.   methocarbamol (ROBAXIN) 500 MG tablet Take 1 tablet (500 mg total) by mouth 3 (three) times daily. May cause drowsiness   metoprolol succinate (TOPROL-XL) 100 MG 24 hr tablet TAKE 1 TABLET BY MOUTH 2 TIMES DAILY. TAKE WITH OR IMMEDIATELY FOLLOWING A MEAL   Pediatric Multivitamins-Iron (FLINTSTONES COMPLETE) 18 MG CHEW Chew 1 tablet by mouth 2 (two) times daily.   potassium chloride (KLOR-CON) 10 MEQ tablet Take 10 mEq by mouth daily. Pt takes 1 tablet 10 MEQ once a day.   rivaroxaban (XARELTO) 20 MG TABS tablet Take 1 tablet (20 mg total) by mouth daily with supper.     Allergies:   Omeprazole, Actos [pioglitazone], Benzocaine-menthol, Colesevelam, Flagyl [metronidazole hcl], Metformin and related, Shrimp [shellfish allergy], Statins, Allevyn adhesive [wound dressings], Desipramine hcl, Hydromorphone, Jardiance [empagliflozin], Lactose intolerance (gi), Adhesive [tape], Nisoldipine, and Percocet [oxycodone-acetaminophen]   Social History   Socioeconomic History   Marital status: Married    Spouse name: Not on file   Number of children: Not on file   Years of education: Not on file   Highest education level: Not on file  Occupational History   Occupation: Retired  Tobacco Use   Smoking status: Never   Smokeless tobacco: Never  Vaping Use   Vaping Use: Never used  Substance and Sexual Activity   Alcohol use: Not Currently    Comment:  ocassionally   Drug use: No   Sexual activity: Not Currently    Partners: Male    Birth control/protection: Surgical    Comment: hysterectomy  Other Topics Concern   Not on file  Social History Narrative   She lives with husband in [place] home.  They have one grown son and 2 grandchildren.   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   Highest of level education:  Some college.   Never smoker.   Alcohol: rare.   Social Determinants of Health   Financial Resource Strain: Medium Risk (11/29/2020)   Overall Financial Resource Strain (CARDIA)    Difficulty of Paying Living Expenses: Somewhat hard  Food Insecurity: No Food Insecurity (10/09/2022)   Hunger Vital Sign    Worried About Running Out of Food in the Last Year: Never true    Ran Out of Food in the Last Year: Never true  Transportation Needs: No Transportation Needs (10/09/2022)   PRAPARE - Administrator, Civil Service (Medical): No    Lack of Transportation (Non-Medical): No  Physical Activity: Inactive (11/29/2020)   Exercise Vital Sign    Days of Exercise per Week: 0 days    Minutes of Exercise per Session: 0 min  Stress: No Stress Concern Present (12/10/2021)   [employer] of Occupational Health - Occupational Stress Questionnaire    Feeling of Stress : Not at all  Social Connections: Moderately Isolated (12/11/2021)   Social Connection and Isolation Panel [NHANES]    Frequency of Communication with Friends and Family: More than three times a week    Frequency of Social Gatherings with Friends and Family: More than three times a week    Attends Religious Services: Never    Database administrator or Organizations: No  Attends Banker Meetings: Never    Marital Status: Married    Family History: The patient's family history includes Alcohol abuse in an other family member; Asthma in her maternal grandfather; Colon cancer in her paternal grandfather; Diabetes in her mother and son; Emphysema in her  maternal grandfather; Heart attack in her father; Heart disease in her father; Hypertension in her mother and son; Lung disease in her father; Stomach cancer in her paternal grandmother. There is no history of Neuropathy.  ROS:   Please see the history of present illness.    All other systems reviewed and are negative.  EKGs/Labs/Other Studies Reviewed:    The following studies were reviewed today:  CT imaging 12/24/22:  MPRESSION: 1. The left atrial appendage is a large chicken wing morphology without thrombus.   2. A 31 mm Watchman FLX device is recommended based on the above landing zone measurements (23.5 mm maximum diameter; 24% compression).   3. An inferior posterior IAS puncture site is recommended.   4. Optimal deployment angle: RAO 2 CAU 8   5. CAC score of 623, which is 89th percentile for age-, sex-, and race-matched controls.  EKG:  EKG is ordered today.  The ekg ordered today demonstrates NSR with HR 71bpm  Recent Labs: 10/09/2022: Magnesium 2.2; TSH 0.618 11/28/2022: ALT 37 01/08/2023: BUN 13; Creatinine, Ser 0.68; Hemoglobin 11.2; Platelets 141; Potassium 3.6; Sodium 141   Recent Lipid Panel    Component Value Date/Time   CHOL 190 08/28/2022 1019   TRIG 139.0 08/28/2022 1019   TRIG 196 01/26/2010 0000   HDL 41.10 08/28/2022 1019   CHOLHDL 5 08/28/2022 1019   VLDL 27.8 08/28/2022 1019   LDLCALC 121 (H) 08/28/2022 1019   LDLDIRECT 69.5 01/24/2011 1125   Risk Assessment/Calculations:    CHA2DS2-VASc Score = 5   This indicates a 7.2% annual risk of stroke. The patient's score is based upon: CHF History: 0 HTN History: 1 Diabetes History: 1 Stroke History: 0 Vascular Disease History: 0 Age Score: 2 Gender Score: 1   HAS-BLED score = 2 Hypertension No  Abnormal renal and liver function (Dialysis, transplant, Cr >2.26 mg/dL /Cirrhosis or Bilirubin >2x Normal or AST/ALT/AP >3x Normal) No  Stroke No  Bleeding Yes  Labile INR (Unstable/high INR) No   Elderly (>65) Yes  Drugs or alcohol (? 8 drinks/week, anti-plt or NSAID) No   Physical Exam:    VS:  BP 124/80   Pulse 71   Ht 5\' 2"  (1.575 m)   Wt 226 lb 3.2 oz (102.6 kg)   SpO2 97%   BMI 41.37 kg/m     Wt Readings from Last 3 Encounters:  01/08/23 226 lb 3.2 oz (102.6 kg)  12/16/22 221 lb 3.2 oz (100.3 kg)  11/28/22 216 lb 6.4 oz (98.2 kg)    General: Well developed, well nourished, NAD Skin: Warm, dry, intact  Lungs:Clear to ausculation bilaterally. No wheezes, rales, or rhonchi. Breathing is unlabored. Cardiovascular: RRR with S1 S2. No murmurs Extremities: No edema.  Neuro: Alert and oriented. No focal deficits. No facial asymmetry. MAE spontaneously. Psych: Responds to questions appropriately with normal affect.    ASSESSMENT/PLAN:    PAF: Evaluated by Dr. Excell [name] for LAAO closure with Watchman. Pre procedure CT with anatomy suitable to proceed. Instruction letter reviewed with all questions answered. CHG given. Obtain CBC, BMET today.    Recurrent epistaxis: Reports ongoing issues with significant nosebleeds. No bleeding in stool or urine. Plan LAAO closure to avoid long  term DOAC. Obtain CBC today.     Medication Adjustments/Labs and Tests Ordered: Current medicines are reviewed at length with the patient today.  Concerns regarding medicines are outlined above.  Orders Placed This Encounter  Procedures   Basic metabolic panel   CBC   EKG 12-Lead   No orders of the defined types were placed in this encounter.   Patient Instructions  Medication Instructions:  Your physician recommends that you continue on your current medications as directed. Please refer to the Current Medication list given to you today.  *If you need a refill on your cardiac medications before your next appointment, please call your pharmacy*   Lab Work: TODAY: BMET, CBC If you have labs ([name] work) drawn today and your tests are completely normal, you will receive your results only  by: MyChart Message (if you have MyChart) OR A paper copy in the mail If you have any lab test that is abnormal or we need to change your treatment, we will call you to review the results.   Testing/Procedures: SEE INSTRUCTION LETTER   Follow-Up: At [facility], you and your health needs are our priority.  As part of our continuing mission to provide you with exceptional heart care, we have created designated Provider Care Teams.  These Care Teams include your primary Cardiologist (physician) and Advanced Practice Providers (APPs -  Physician Assistants and Nurse Practitioners) who all work together to provide you with the care you need, when you need it.  We recommend signing up for the patient portal called "MyChart".  Sign up information is provided on this After Visit Summary.  MyChart is used to connect with patients for Virtual Visits (Telemedicine).  Patients are able to view lab/test results, encounter notes, upcoming appointments, etc.  Non-urgent messages can be sent to your provider as well.   To learn more about what you can do with MyChart, go to [email or web address].    Your next appointment:   POST PROCEDURAL    Signed, Georgie Chard, NP  01/09/2023 10:16 AM     [facility]

## 2023-01-07 NOTE — Progress Notes (Unsigned)
[facility]                                     Cardiology Office Note:    Date:  01/07/2023   ID:  [name], DOB December 27, 1945, MRN [id number]  PCP:  Jeoffrey Massed, MD  [facility] Cardiologist:  Olga Millers, MD / Dr. Excell Seltzer, MD (LAAO) [name] Electrophysiologist:  None   Referring MD: Jeoffrey Massed, MD   No chief complaint on file. ***  History of Present Illness:    [name] is a 77 y.o. female with a hx of PAF with hx of epistaxis while taking DOAC who was referred to Dr. Excell Seltzer for LAAO closure with Watchman evaluation.     [name] follows with Dr. Jens Som for her cardiology care and reported daily nosebleeds while taking Xarelto for her atrial fibrillation. In consult with Dr. Excell Seltzer she was interested and underwent pre procedure CT imagining that showed anatomy suitable for 80mm Watchman FLX device. She is now scheduled for her procedure 01/22/23 and is here today for pre-operative evaluation.   She is here with   Procedure scheduled for 01/23/23. CBC, BMET, soap, instructions.    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 ? to get liver bx if she gets bariatric surgery? Mild portal hypertensive gastropathy on EGD 08/2019.   Dysrhythmia    Fibromyalgia    GAD (generalized anxiety disorder)    GERD    History of hiatal hernia    History of iron deficiency anemia 12/2018   Inadequate absorption secondary to chronic/long term PPI therapy + portal hypertensive gastroduodenopathy. No GIB found on EGD, colonosc, and givens. Iron infusions X multiple.   History of thrombocytopenia 12/2011   Hyperlipidemia    Intolerant of statins   HYPERTENSION    IBS (irritable bowel syndrome)    -D.  Good response to bentyl and imodium as of 06/2018 GI f/u.   IDDM (insulin dependent diabetes  mellitus)    with DPN (managed by Dr. Elvera Lennox but then in 2018 pt preferred to have me manage for her convenience)   Limited mobility    Requires a walker for arthritic pain, widespread musculoskeletal pain, and neuropathic pain.   Morbid obesity (HCC)    As of 11/2018, pt considering sleeve gastectomy vs bipass as of eval by Dr. Alvino Blood considering as of 12/2019.   Nonalcoholic steatohepatitis    Viral Hep screens NEG.  CT 2015.  Transaminasemia.  U/S 07/2018 showed early changes of cirrhosis.   OSA (obstructive sleep apnea) 09/14/2015   sleep study 09/07/15: severe obstructive sleep apnea with an AHI of 72 and SaO2 low of 75%.>referred to sleep MD   Osteoarthritis    hips, shoulders, knees   PAF (paroxysmal atrial fibrillation) (HCC)    One documented episode (after getting EGD 2016).  Was on amiodarone x 3 mo.  Rate control with metoprolol + anticoag with xarelto.    Recurrent epistaxis    Granuloma in L nare cauterized by ENT 04/2020. Another cautery 06/2020   Small fiber neuropathy    Due to DM.  Symmetric hands and feet tingling/numbness.   Ulcerative colitis (HCC)    Remicade infusion Q 8 weeks: in clinical and endoscopic remission as  of 12/2018 GI f/u.  06/11/19 rpt colonoscopy->cecal and ascending colon colitis.    Past Surgical History:  Procedure Laterality Date   ABDOMINAL HYSTERECTOMY  1980   Paps no longer indicated.   BACK SURGERY     BACTERIAL OVERGROWTH TEST N/A 07/13/2015   Procedure: BACTERIAL OVERGROWTH TEST;  Surgeon: Malissa Hippo, MD;  [facility];  Service: Endoscopy;  Laterality: N/A;  730     BILATERAL SALPINGOOPHORECTOMY  02/10/2001   BIOPSY  06/11/2019   Procedure: BIOPSY;  Surgeon: Malissa Hippo, MD;  [facility];  Service: Endoscopy;;  colon   BIOPSY  09/27/2019   Procedure: BIOPSY;  Surgeon: Malissa Hippo, MD;  [facility];  Service: Endoscopy;;  gastric duodenal   BIOPSY  09/15/2020   Procedure: BIOPSY;   Surgeon: Dolores Frame, MD;  [facility];  Service: Gastroenterology;;   BREAST REDUCTION SURGERY  1994   bilat   BREAST SURGERY     CARDIOVASCULAR STRESS TEST  07/2010   Lexiscan myoview: normal   CARPAL TUNNEL RELEASE Right 1996   CARPAL TUNNEL RELEASE Left 03/21/2003   CARPAL TUNNEL RELEASE Right 05/04/2013   Procedure: CARPAL TUNNEL RELEASE;  Surgeon: Wyn Forster., MD;  Location: [facility];  Service: Orthopedics;  Laterality: Right;   CARPAL TUNNEL RELEASE Left 09/21/2013   Procedure: LEFT CARPAL TUNNEL RELEASE;  Surgeon: Wyn Forster., MD;  Location: [facility];  Service: Orthopedics;  Laterality: Left;   CHOLECYSTECTOMY     COLONOSCOPY WITH PROPOFOL N/A 08/04/2015   Colitis in remission.  No polyps.  Procedure: COLONOSCOPY WITH PROPOFOL;  Surgeon: Malissa Hippo, MD;  [facility]: [facility];  Service: Endoscopy;  Laterality: N/A;  cecum time in  0820   time out  0827    total time 7 minutes   COLONOSCOPY WITH PROPOFOL N/A 06/11/2019   cecal and ascending colon colitis.  Procedure: COLONOSCOPY WITH PROPOFOL;  Surgeon: Malissa Hippo, MD;  [facility];  Service: Endoscopy;  Laterality: N/A;  730a   COLONOSCOPY WITH PROPOFOL N/A 09/15/2020   Procedure: COLONOSCOPY WITH PROPOFOL;  Surgeon: Dolores Frame, MD;  [facility];  Service: Gastroenterology;  Laterality: N/A;  1030   ESOPHAGEAL DILATION N/A 08/04/2015   Procedure: ESOPHAGEAL DILATION;  Surgeon: Malissa Hippo, MD;  [facility]: [facility];  Service: Endoscopy;  Laterality: N/ALauralee Evener, no mucousal disruption   ESOPHAGOGASTRODUODENOSCOPY  09/27/2019   Performed for IDA.  Esoph dilation was done but no stricture present.  Mild portal hypertensive gastropathy, o/w normal.  Duodenal bx NEG.  h pylori neg.   ESOPHAGOGASTRODUODENOSCOPY (EGD) WITH ESOPHAGEAL DILATION  12/02/2005   ESOPHAGOGASTRODUODENOSCOPY (EGD) WITH PROPOFOL N/A 08/04/2015    Procedure: ESOPHAGOGASTRODUODENOSCOPY (EGD) WITH PROPOFOL;  Surgeon: Malissa Hippo, MD;  [facility];  Service: Endoscopy;  Laterality: N/A;  procedure 1   ESOPHAGOGASTRODUODENOSCOPY (EGD) WITH PROPOFOL N/A 09/27/2019   Procedure: ESOPHAGOGASTRODUODENOSCOPY (EGD) WITH PROPOFOL;  Surgeon: Malissa Hippo, MD;  [facility];  Service: Endoscopy;  Laterality: N/A;  12:10   ESOPHAGOGASTRODUODENOSCOPY (EGD) WITH PROPOFOL N/A 10/31/2021   Procedure: ESOPHAGOGASTRODUODENOSCOPY (EGD) WITH PROPOFOL;  Surgeon: Malissa Hippo, MD;  [facility];  Service: Endoscopy;  Laterality: N/A;  930   FLEXIBLE SIGMOIDOSCOPY  01/17/2012   Procedure: FLEXIBLE SIGMOIDOSCOPY;  Surgeon: Malissa Hippo, MD;  [facility];  Service: Endoscopy;  Laterality: N/A;  GIVENS CAPSULE STUDY N/A 08/03/2019   Procedure: GIVENS CAPSULE STUDY (performed for IDA)->some food debris in stomach and small amount of blood.  Surgeon: Malissa Hippo, MD;  [facility];  Service: Endoscopy;  Laterality: N/A;  730AM   HEMILAMINOTOMY LUMBAR SPINE Bilateral 09/07/1999   L4-5   KNEE ARTHROSCOPY Right 01/1999; 10/2000   LYSIS OF ADHESION  02/10/2001   MALONEY DILATION  09/27/2019   Procedure: Elease Hashimoto DILATION;  Surgeon: Malissa Hippo, MD;  [facility];  Service: Endoscopy;;   NASAL ENDOSCOPY WITH EPISTAXIS CONTROL Bilateral 01/25/2022   Procedure: NASAL ENDOSCOPY WITH EPISTAXIS CONTROL;  Surgeon: Osborn Coho, MD;  Location: [facility];  Service: ENT;  Laterality: Bilateral;   NASAL SEPTOPLASTY W/ TURBINOPLASTY Bilateral 01/25/2022   Procedure: NASAL SEPTOPLASTY WITH TURBINATE REDUCTION;  Surgeon: Osborn Coho, MD;  Location[facility];  Service: ENT;  Laterality: Bilateral;   RECTOCELE REPAIR  1990; 09/12/2006   TARSAL TUNNEL RELEASE  2002   TRANSTHORACIC ECHOCARDIOGRAM  08/04/2015   EF 60-65%, normal wall motion, mild LVH, mild LA dilation, grd I DD.   TUMOR EXCISION Left  03/21/2003   dorsal 1st web space (hand)   URETEROLYSIS Right 02/10/2001    Current Medications: No outpatient medications have been marked as taking for the 01/08/23 encounter (Appointment) with [facility].     Allergies:   Omeprazole, Actos [pioglitazone], Benzocaine-menthol, Colesevelam, Flagyl [metronidazole hcl], Metformin and related, Shrimp [shellfish allergy], Statins, Allevyn adhesive [wound dressings], Desipramine hcl, Hydromorphone, Jardiance [empagliflozin], Lactose intolerance (gi), Adhesive [tape], Nisoldipine, and Percocet [oxycodone-acetaminophen]   Social History   Socioeconomic History   Marital status: Married    Spouse name: Not on file   Number of children: Not on file   Years of education: Not on file   Highest education level: Not on file  Occupational History   Occupation: Retired  Tobacco Use   Smoking status: Never   Smokeless tobacco: Never  Vaping Use   Vaping Use: Never used  Substance and Sexual Activity   Alcohol use: Not Currently    Comment: ocassionally   Drug use: No   Sexual activity: Not Currently    Partners: Male    Birth control/protection: Surgical    Comment: hysterectomy  Other Topics Concern   Not on file  Social History Narrative   She lives with husband in [place].  They have one grown son and 2 grandchildren.   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   Highest of level education:  Some college.   Never smoker.   Alcohol: rare.   Social Determinants of Health   Financial Resource Strain: Medium Risk (11/29/2020)   Overall Financial Resource Strain (CARDIA)    Difficulty of Paying Living Expenses: Somewhat hard  Food Insecurity: No Food Insecurity (10/09/2022)   Hunger Vital Sign    Worried About Running Out of Food in the Last Year: Never true    Ran Out of Food in the Last Year: Never true  Transportation Needs: No Transportation Needs (10/09/2022)   PRAPARE - Administrator, Civil Service  (Medical): No    Lack of Transportation (Non-Medical): No  Physical Activity: Inactive (11/29/2020)   Exercise Vital Sign    Days of Exercise per Week: 0 days    Minutes of Exercise per Session: 0 min  Stress: No Stress Concern Present (12/10/2021)   [name] [employer] - Occupational Stress Questionnaire    Feeling of Stress : Not  at all  Social Connections: Moderately Isolated (12/11/2021)   Social Connection and Isolation Panel [NHANES]    Frequency of Communication with Friends and Family: More than three times a week    Frequency of Social Gatherings with Friends and Family: More than three times a week    Attends Religious Services: Never    Database administrator or Organizations: No    Attends Engineer, structural: Never    Marital Status: Married    Family History: The patient's family history includes Alcohol abuse in an other family member; Asthma in her maternal grandfather; Colon cancer in her paternal grandfather; Diabetes in her mother and son; Emphysema in her maternal grandfather; Heart attack in her father; Heart disease in her father; Hypertension in her mother and son; Lung disease in her father; Stomach cancer in her paternal grandmother. There is no history of Neuropathy.  ROS:   Please see the history of present illness.    All other systems reviewed and are negative.  EKGs/Labs/Other Studies Reviewed:    The following studies were reviewed today:  CT imaging 12/24/22:  MPRESSION: 1. The left atrial appendage is a large chicken wing morphology without thrombus.   2. A 31 mm Watchman FLX device is recommended based on the above landing zone measurements (23.5 mm maximum diameter; 24% compression).   3. An inferior posterior IAS puncture site is recommended.   4. Optimal deployment angle: RAO 2 CAU 8   5. CAC score of 623, which is 89th percentile for age-, sex-, and race-matched controls.  EKG:  EKG is *** ordered today.   The ekg ordered today demonstrates ***  Recent Labs: 10/09/2022: Magnesium 2.2; TSH 0.618 11/28/2022: ALT 37; Hemoglobin 13.2; Platelets 121.0 12/16/2022: BUN 15; Creatinine, Ser 0.64; Potassium 4.3; Sodium 139  Recent Lipid Panel    Component Value Date/Time   CHOL 190 08/28/2022 1019   TRIG 139.0 08/28/2022 1019   TRIG 196 01/26/2010 0000   HDL 41.10 08/28/2022 1019   CHOLHDL 5 08/28/2022 1019   VLDL 27.8 08/28/2022 1019   LDLCALC 121 (H) 08/28/2022 1019   LDLDIRECT 69.5 01/24/2011 1125   Risk Assessment/Calculations:    CHA2DS2-VASc Score = 5   This indicates a 7.2% annual risk of stroke. The patient's score is based upon: CHF History: 0 HTN History: 1 Diabetes History: 1 Stroke History: 0 Vascular Disease History: 0 Age Score: 2 Gender Score: 1   HAS-BLED score = 2 Hypertension No  Abnormal renal and liver function (Dialysis, transplant, Cr >2.26 mg/dL /Cirrhosis or Bilirubin >2x Normal or AST/ALT/AP >3x Normal) No  Stroke No  Bleeding Yes  Labile INR (Unstable/high INR) No  Elderly (>65) Yes  Drugs or alcohol (? 8 drinks/week, anti-plt or NSAID) No   Physical Exam:    VS:  There were no vitals taken for this visit.    Wt Readings from Last 3 Encounters:  12/16/22 221 lb 3.2 oz (100.3 kg)  11/28/22 216 lb 6.4 oz (98.2 kg)  11/04/22 219 lb 9.6 oz (99.6 kg)    General: Well developed, well nourished, NAD Skin: Warm, dry, intact  Head: Normocephalic, atraumatic, sclera non-icteric, no xanthomas, clear, moist mucus membranes. Neck: Negative for carotid bruits. No JVD Lungs:Clear to ausculation bilaterally. No wheezes, rales, or rhonchi. Breathing is unlabored. Cardiovascular: RRR with S1 S2. No murmurs, rubs, gallops, or LV heave appreciated. Abdomen: Soft, non-tender, non-distended with normoactive bowel sounds. No hepatomegaly, No rebound/guarding. No obvious abdominal masses. MSK: Strength and tone  appear normal for age. 5/5 in all extremities Extremities: No  edema. No clubbing or cyanosis. DP/PT pulses 2+ bilaterally Neuro: Alert and oriented. No focal deficits. No facial asymmetry. MAE spontaneously. Psych: Responds to questions appropriately with normal affect.     ASSESSMENT/PLAN:    PAF: Evaluated by Dr. Excell Seltzerooper for LAAO closure with Watchman. Pre procedure CT with anatomy suitable to proceed. Instruction letter reviewed with all questions answered. CHG given. Obtain CBC, BMET today.     Medication Adjustments/Labs and Tests Ordered: Current medicines are reviewed at length with the patient today.  Concerns regarding medicines are outlined above.  No orders of the defined types were placed in this encounter.  No orders of the defined types were placed in this encounter.   There are no Patient Instructions on file for this visit.   Signed, Georgie ChardJill Omran Keelin, NP  01/07/2023 2:55 PM    [facility]

## 2023-01-08 ENCOUNTER — Ambulatory Visit: Payer: Medicare PPO | Attending: Cardiology | Admitting: Cardiology

## 2023-01-08 VITALS — BP 124/80 | HR 71 | Ht 62.0 in | Wt 226.2 lb

## 2023-01-08 DIAGNOSIS — R040 Epistaxis: Secondary | ICD-10-CM | POA: Diagnosis not present

## 2023-01-08 DIAGNOSIS — Z01810 Encounter for preprocedural cardiovascular examination: Secondary | ICD-10-CM | POA: Diagnosis not present

## 2023-01-08 DIAGNOSIS — I480 Paroxysmal atrial fibrillation: Secondary | ICD-10-CM | POA: Diagnosis not present

## 2023-01-08 NOTE — Patient Instructions (Signed)
Medication Instructions:  Your physician recommends that you continue on your current medications as directed. Please refer to the Current Medication list given to you today.  *If you need a refill on your cardiac medications before your next appointment, please call your pharmacy*   Lab Work: TODAY: BMET, CBC If you have labs (blood work) drawn today and your tests are completely normal, you will receive your results only by: MyChart Message (if you have MyChart) OR A paper copy in the mail If you have any lab test that is abnormal or we need to change your treatment, we will call you to review the results.   Testing/Procedures: SEE INSTRUCTION LETTER   Follow-Up: At [facility], you and your health needs are our priority.  As part of our continuing mission to provide you with exceptional heart care, we have created designated Provider Care Teams.  These Care Teams include your primary Cardiologist (physician) and Advanced Practice Providers (APPs -  Physician Assistants and Nurse Practitioners) who all work together to provide you with the care you need, when you need it.  We recommend signing up for the patient portal called "MyChart".  Sign up information is provided on this After Visit Summary.  MyChart is used to connect with patients for Virtual Visits (Telemedicine).  Patients are able to view lab/test results, encounter notes, upcoming appointments, etc.  Non-urgent messages can be sent to your provider as well.   To learn more about what you can do with MyChart, go to [email or web address].    Your next appointment:   POST PROCEDURAL

## 2023-01-09 ENCOUNTER — Telehealth: Payer: Self-pay | Admitting: Family Medicine

## 2023-01-09 LAB — CBC
Hematocrit: 34.1 % (ref 34.0–46.6)
Hemoglobin: 11.2 g/dL (ref 11.1–15.9)
MCH: 31.5 pg (ref 26.6–33.0)
MCHC: 32.8 g/dL (ref 31.5–35.7)
MCV: 96 fL (ref 79–97)
Platelets: 141 10*3/uL — ABNORMAL LOW (ref 150–450)
RBC: 3.55 x10E6/uL — ABNORMAL LOW (ref 3.77–5.28)
RDW: 12.1 % (ref 11.7–15.4)
WBC: 6 10*3/uL (ref 3.4–10.8)

## 2023-01-09 LAB — BASIC METABOLIC PANEL
BUN/Creatinine Ratio: 19 (ref 12–28)
BUN: 13 mg/dL (ref 8–27)
CO2: 24 mmol/L (ref 20–29)
Calcium: 9 mg/dL (ref 8.7–10.3)
Chloride: 103 mmol/L (ref 96–106)
Creatinine, Ser: 0.68 mg/dL (ref 0.57–1.00)
Glucose: 196 mg/dL — ABNORMAL HIGH (ref 70–99)
Potassium: 3.6 mmol/L (ref 3.5–5.2)
Sodium: 141 mmol/L (ref 134–144)
eGFR: 91 mL/min/{1.73_m2} (ref >59)

## 2023-01-09 NOTE — Telephone Encounter (Signed)
Contacted [name] to schedule their annual wellness visit. Appointment made for 12/15/2022.  [facility] AWV TEAM Direct Dial [phone]

## 2023-01-15 ENCOUNTER — Ambulatory Visit (INDEPENDENT_AMBULATORY_CARE_PROVIDER_SITE_OTHER): Payer: Medicare PPO

## 2023-01-15 VITALS — Wt 226.0 lb

## 2023-01-15 DIAGNOSIS — Z0000 Encounter for general adult medical examination without abnormal findings: Secondary | ICD-10-CM | POA: Diagnosis not present

## 2023-01-15 DIAGNOSIS — Z1231 Encounter for screening mammogram for malignant neoplasm of breast: Secondary | ICD-10-CM

## 2023-01-15 NOTE — Progress Notes (Signed)
I connected with  [name] on 01/15/23 by a audio enabled telemedicine application and verified that I am speaking with the correct person using two identifiers.  Patient Location: Home  Provider Location: Home Office  I discussed the limitations of evaluation and management by telemedicine. The patient expressed understanding and agreed to proceed.   Subjective:   [name] is a 77 y.o. female who presents for Medicare Annual (Subsequent) preventive examination.  Review of Systems     Cardiac Risk Factors include: advanced age (>54men, >84 women);diabetes mellitus;dyslipidemia;hypertension;obesity (BMI >30kg/m2);sedentary lifestyle     Objective:    Today's Vitals   01/15/23 1255  Weight: 226 lb (102.5 kg)   Body mass index is 41.34 kg/m.     01/15/2023    1:32 PM 10/09/2022    9:51 PM 10/09/2022    1:24 PM 09/27/2022   12:43 PM 01/25/2022    6:18 AM 01/22/2022    1:19 PM 10/31/2021    8:22 AM  Advanced Directives  Does Patient Have a Medical Advance Directive? Yes  No No Yes Yes Yes  Type of Estate agent of [place];Living will    Healthcare Power of [place];Living will Healthcare Power of [place];Living will Healthcare Power of [place];Living will  Does patient want to make changes to medical advance directive?      No - Patient declined   Copy of Healthcare Power of Attorney in Chart? No - copy requested    No - copy requested Yes - validated most recent copy scanned in chart (See row information) No - copy requested  Would patient like information on creating a medical advance directive?  No - Patient declined  No - Patient declined   No - Patient declined    Current Medications (verified) Outpatient Encounter Medications as of 01/15/2023  Medication Sig   ACCU-CHEK GUIDE test strip USE TO CHECK BLOOD GLUCOSE UP TO 6 TIMES DAILY AS DIRECTED   Accu-Chek Softclix Lancets lancets USE TO CHECK BLOOD GLUCOSE UP TO 6 TIMES DAILY AS DIRECTED    acetaminophen (TYLENOL) 325 MG tablet Take 650 mg by mouth every 6 (six) hours as needed for moderate pain or headache.   amitriptyline (ELAVIL) 25 MG tablet Take 25 mg by mouth in the morning and at bedtime.   blood glucose meter kit and supplies KIT Use up to six times daily as directed. DX. E11.9   canagliflozin (INVOKANA) 100 MG TABS tablet TAKE 1 TABLET BY MOUTH EVERY DAY BEFORE BREAKFAST   citalopram (CELEXA) 20 MG tablet TAKE 1 TABLET BY MOUTH EVERY DAY   dicyclomine (BENTYL) 10 MG capsule TAKE 1 CAPSULE (10 MG TOTAL) BY MOUTH EVERY 8 (EIGHT) HOURS AS NEEDED FOR SPASMS (ABDOMINAL PAIN).   diphenoxylate-atropine (LOMOTIL) 2.5-0.025 MG tablet Take 1 tablet by mouth 4 (four) times daily as needed for diarrhea or loose stools.   Dulaglutide (TRULICITY) 1.5 MG/0.5ML SOPN INJECT 1.5MG  ONCE WEEKLY   esomeprazole (NEXIUM) 20 MG capsule TAKE 1 CAPSULE BY MOUTH DAILY BEFORE BREAKFAST   furosemide (LASIX) 40 MG tablet Take 1 tablet (40 mg total) by mouth daily.   gabapentin (NEURONTIN) 600 MG tablet TAKE 1 AND 1/2 TABLETS BY MOUTH TWICE A DAY   INFLIXIMAB IV Inject into the vein every 8 (eight) weeks. Remicaid /kg Infusion every 8 weeks [facility])   Insulin Pen Needle (B-D UF III MINI PEN NEEDLES) 31G X 5 MM MISC USE TO INJECT INSULINS EQUAL TO 6 TIMES DAILY.   insulin  regular human CONCENTRATED (HUMULIN R U-500 KWIKPEN) 500 UNIT/ML KwikPen DIAL AND INJECT 100 UNITS UNDER THE SKIN WITH BREAKFAST AND DINNER AND 45 UNITS IN EVENING. (Patient taking differently: Pt stated 100 units for breakfast lunch and dinner)   lactase (LACTAID) 3000 UNITS tablet Take 3,000 Units by mouth as needed (when eating foods containing dairy).    meclizine (ANTIVERT) 25 MG tablet Take 1 tablet (25 mg total) by mouth 3 (three) times daily as needed for dizziness or nausea.   metoprolol succinate (TOPROL-XL) 100 MG 24 hr tablet TAKE 1 TABLET BY MOUTH 2 TIMES DAILY. TAKE WITH OR IMMEDIATELY FOLLOWING A MEAL    potassium chloride (KLOR-CON) 10 MEQ tablet Take 10 mEq by mouth daily. Pt takes 1 tablet 10 MEQ once a day.   rivaroxaban (XARELTO) 20 MG TABS tablet Take 1 tablet (20 mg total) by mouth daily with supper.   ferric carboxymaltose (INJECTAFER) 750 MG/15ML SOLN injection Inject 15 mLs (750 mg total) into the vein as directed for 1 dose. Repeat in 7 days (Patient not taking: Reported on 01/15/2023)   methocarbamol (ROBAXIN) 500 MG tablet Take 1 tablet (500 mg total) by mouth 3 (three) times daily. May cause drowsiness (Patient not taking: Reported on 01/15/2023)   Pediatric Multivitamins-Iron (FLINTSTONES COMPLETE) 18 MG CHEW Chew 1 tablet by mouth 2 (two) times daily. (Patient not taking: Reported on 01/15/2023)   [DISCONTINUED] bromocriptine (PARLODEL) 2.5 MG tablet Take 2.5 mg by mouth 2 (two) times daily.   No facility-administered encounter medications on file as of 01/15/2023.    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 ? to get liver bx if she gets bariatric surgery? Mild portal hypertensive gastropathy on EGD 08/2019.   Dysrhythmia    Fibromyalgia    GAD (generalized anxiety disorder)    GERD    History of hiatal hernia    History of iron deficiency anemia 12/2018   Inadequate absorption secondary to chronic/long term PPI therapy + portal hypertensive gastroduodenopathy. No GIB found on EGD, colonosc, and givens. Iron infusions X multiple.   History of thrombocytopenia  12/2011   Hyperlipidemia    Intolerant of statins   HYPERTENSION    IBS (irritable bowel syndrome)    -D.  Good response to bentyl and imodium as of 06/2018 GI f/u.   IDDM (insulin dependent diabetes mellitus)    with DPN (managed by Dr. Elvera Lennox but then in 2018 pt preferred to have me manage for her convenience)   Limited mobility    Requires a walker for arthritic pain, widespread musculoskeletal pain, and neuropathic pain.   Morbid obesity    As of 11/2018, pt considering sleeve gastectomy vs bipass as of eval by Dr. Alvino Blood considering as of 12/2019.   Nonalcoholic steatohepatitis    Viral Hep screens NEG.  CT 2015.  Transaminasemia.  U/S 07/2018 showed early changes of cirrhosis.   OSA (obstructive sleep apnea) 09/14/2015   sleep study 09/07/15: severe obstructive sleep apnea with an AHI of 72 and SaO2 low of 75%.>referred to sleep MD   Osteoarthritis    hips, shoulders, knees   PAF (paroxysmal atrial fibrillation)  One documented episode (after getting EGD 2016).  Was on amiodarone x 3 mo.  Rate control with metoprolol + anticoag with xarelto.    Recurrent epistaxis    Granuloma in L nare cauterized by ENT 04/2020. Another cautery 06/2020   Small fiber neuropathy    Due to DM.  Symmetric hands and feet tingling/numbness.   Ulcerative colitis    Remicade infusion Q 8 weeks: in clinical and endoscopic remission as of 12/2018 GI f/u.  06/11/19 rpt colonoscopy->cecal and ascending colon colitis.   Past Surgical History:  Procedure Laterality Date   ABDOMINAL HYSTERECTOMY  1980   Paps no longer indicated.   BACK SURGERY     BACTERIAL OVERGROWTH TEST N/A 07/13/2015   Procedure: BACTERIAL OVERGROWTH TEST;  Surgeon: Malissa Hippo, MD;  [facility];  Service: Endoscopy;  Laterality: N/A;  730     BILATERAL SALPINGOOPHORECTOMY  02/10/2001   BIOPSY  06/11/2019   Procedure: BIOPSY;  Surgeon: Malissa Hippo, MD;  [facility];  Service: Endoscopy;;  colon    BIOPSY  09/27/2019   Procedure: BIOPSY;  Surgeon: Malissa Hippo, MD;  [facility];  Service: Endoscopy;;  gastric duodenal   BIOPSY  09/15/2020   Procedure: BIOPSY;  Surgeon: Dolores Frame, MD;  [facility];  Service: Gastroenterology;;   BREAST REDUCTION SURGERY  1994   bilat   BREAST SURGERY     CARDIOVASCULAR STRESS TEST  07/2010   Lexiscan myoview: normal   CARPAL TUNNEL RELEASE Right 1996   CARPAL TUNNEL RELEASE Left 03/21/2003   CARPAL TUNNEL RELEASE Right 05/04/2013   Procedure: CARPAL TUNNEL RELEASE;  Surgeon: Wyn Forster., MD;  Location: [facility];  Service: Orthopedics;  Laterality: Right;   CARPAL TUNNEL RELEASE Left 09/21/2013   Procedure: LEFT CARPAL TUNNEL RELEASE;  Surgeon: Wyn Forster., MD;  Location: [facility];  Service: Orthopedics;  Laterality: Left;   CHOLECYSTECTOMY     COLONOSCOPY WITH PROPOFOL N/A 08/04/2015   Colitis in remission.  No polyps.  Procedure: COLONOSCOPY WITH PROPOFOL;  Surgeon: Malissa Hippo, MD;  [facility];  Service: Endoscopy;  Laterality: N/A;  cecum time in  0820   time out  0827    total time 7 minutes   COLONOSCOPY WITH PROPOFOL N/A 06/11/2019   cecal and ascending colon colitis.  Procedure: COLONOSCOPY WITH PROPOFOL;  Surgeon: Malissa Hippo, MD;  [facility];  Service: Endoscopy;  Laterality: N/A;  730a   COLONOSCOPY WITH PROPOFOL N/A 09/15/2020   Procedure: COLONOSCOPY WITH PROPOFOL;  Surgeon: Dolores Frame, MD;  [facility];  Service: Gastroenterology;  Laterality: N/A;  1030   ESOPHAGEAL DILATION N/A 08/04/2015   Procedure: ESOPHAGEAL DILATION;  Surgeon: Malissa Hippo, MD;  [facility];  Service: Endoscopy;  Laterality: N/ALauralee Evener, no mucousal disruption   ESOPHAGOGASTRODUODENOSCOPY  09/27/2019   Performed for IDA.  Esoph dilation was done but no stricture present.  Mild portal hypertensive  gastropathy, o/w normal.  Duodenal bx NEG.  h pylori neg.   ESOPHAGOGASTRODUODENOSCOPY (EGD) WITH ESOPHAGEAL DILATION  12/02/2005   ESOPHAGOGASTRODUODENOSCOPY (EGD) WITH PROPOFOL N/A 08/04/2015   Procedure: ESOPHAGOGASTRODUODENOSCOPY (EGD) WITH PROPOFOL;  Surgeon: Malissa Hippo, MD;  [facility];  Service: Endoscopy;  Laterality: N/A;  procedure 1   ESOPHAGOGASTRODUODENOSCOPY (EGD) WITH PROPOFOL N/A 09/27/2019   Procedure: ESOPHAGOGASTRODUODENOSCOPY (EGD) WITH PROPOFOL;  Surgeon: Malissa Hippo, MD;  [facility];  Service: Endoscopy;  Laterality: N/A;  12:10   ESOPHAGOGASTRODUODENOSCOPY (EGD) WITH PROPOFOL N/A 10/31/2021   Procedure: ESOPHAGOGASTRODUODENOSCOPY (EGD) WITH PROPOFOL;  Surgeon: Malissa Hippo, MD;  [facility];  Service: Endoscopy;  Laterality: N/A;  930   FLEXIBLE SIGMOIDOSCOPY  01/17/2012   Procedure: FLEXIBLE SIGMOIDOSCOPY;  Surgeon: Malissa Hippo, MD;  [facility];  Service: Endoscopy;  Laterality: N/A;   GIVENS CAPSULE STUDY N/A 08/03/2019   Procedure: GIVENS CAPSULE STUDY (performed for IDA)->some food debris in stomach and small amount of blood.  Surgeon: Malissa Hippo, MD;  [facility];  Service: Endoscopy;  Laterality: N/A;  730AM   HEMILAMINOTOMY LUMBAR SPINE Bilateral 09/07/1999   L4-5   KNEE ARTHROSCOPY Right 01/1999; 10/2000   LYSIS OF ADHESION  02/10/2001   MALONEY DILATION  09/27/2019   Procedure: Elease Hashimoto DILATION;  Surgeon: Malissa Hippo, MD;  [facility];  Service: Endoscopy;;   NASAL ENDOSCOPY WITH EPISTAXIS CONTROL Bilateral 01/25/2022   Procedure: NASAL ENDOSCOPY WITH EPISTAXIS CONTROL;  Surgeon: Osborn Coho, MD;  [facility];  Service: ENT;  Laterality: Bilateral;   NASAL SEPTOPLASTY W/ TURBINOPLASTY Bilateral 01/25/2022   Procedure: NASAL SEPTOPLASTY WITH TURBINATE REDUCTION;  Surgeon: Osborn Coho, MD;  Location[facility];  Service: ENT;  Laterality: Bilateral;   RECTOCELE  REPAIR  1990; 09/12/2006   TARSAL TUNNEL RELEASE  2002   TRANSTHORACIC ECHOCARDIOGRAM  08/04/2015   EF 60-65%, normal wall motion, mild LVH, mild LA dilation, grd I DD.   TUMOR EXCISION Left 03/21/2003   dorsal 1st web space (hand)   URETEROLYSIS Right 02/10/2001   Family History  Problem Relation Age of Onset   Diabetes Mother    Hypertension Mother    Heart attack Father        Mid 79's   Heart disease Father    Lung disease Father        spot on lung; had lung surgery   Alcohol abuse Other    Hypertension Son    Diabetes Son    Emphysema Maternal Grandfather    Asthma Maternal Grandfather    Colon cancer Paternal Grandfather    Stomach cancer Paternal Grandmother    Neuropathy Neg Hx    Social History   Socioeconomic History   Marital status: Married    Spouse name: Not on file   Number of children: Not on file   Years of education: Not on file   Highest education level: Not on file  Occupational History   Occupation: Retired  Tobacco Use   Smoking status: Never   Smokeless tobacco: Never  Vaping Use   Vaping Use: Never used  Substance and Sexual Activity   Alcohol use: Not Currently    Comment: ocassionally   Drug use: No   Sexual activity: Not Currently    Partners: Male    Birth control/protection: Surgical    Comment: hysterectomy  Other Topics Concern   Not on file  Social History Narrative   She lives with husband in [place] home.  They have one grown son and 2 grandchildren.   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   Highest of level education:  Some college.   Never smoker.   Alcohol: rare.   Social Determinants of Health   Financial Resource Strain: Low Risk  (01/15/2023)   Overall Financial Resource Strain (CARDIA)    Difficulty of Paying Living Expenses: Not hard at all  Food Insecurity:  No Food Insecurity (01/15/2023)   Hunger Vital Sign    Worried About Running Out of Food in the Last Year: Never true    Ran Out of Food in the Last Year:  Never true  Transportation Needs: No Transportation Needs (01/15/2023)   PRAPARE - Administrator, Civil Service (Medical): No    Lack of Transportation (Non-Medical): No  Physical Activity: Inactive (01/15/2023)   Exercise Vital Sign    Days of Exercise per Week: 0 days    Minutes of Exercise per Session: 0 min  Stress: No Stress Concern Present (01/15/2023)   [employer] of Occupational Health - Occupational Stress Questionnaire    Feeling of Stress : Not at all  Social Connections: Moderately Isolated (01/15/2023)   Social Connection and Isolation Panel [NHANES]    Frequency of Communication with Friends and Family: Three times a week    Frequency of Social Gatherings with Friends and Family: Once a week    Attends Religious Services: Never    Database administrator or Organizations: No    Attends Engineer, structural: Not on file    Marital Status: Married    Tobacco Counseling Counseling given: Not Answered   Clinical Intake:  Pre-visit preparation completed: Yes  Pain : No/denies pain     BMI - recorded: 41.34 Nutritional Status: BMI > 30  Obese Nutritional Risks: None Diabetes: Yes CBG done?: Yes (120 per pt) CBG resulted in Enter/ Edit results?: No Did pt. bring in CBG monitor from home?: No  How often do you need to have someone help you when you read instructions, pamphlets, or other written materials from your doctor or pharmacy?: 1 - Never  Diabetic?Nutrition Risk Assessment:  Has the patient had any N/V/D within the last 2 months?  No  Does the patient have any non-healing wounds?  No  Has the patient had any unintentional weight loss or weight gain?  No   Diabetes:  Is the patient diabetic?  Yes  If diabetic, was a CBG obtained today?  Yes  Did the patient bring in their glucometer from home?  No  How often do you monitor your CBG's? daily.   Financial Strains and Diabetes Management:  Are you having any financial strains  with the device, your supplies or your medication? No .  Does the patient want to be seen by Chronic Care Management for management of their diabetes?  No  Would the patient like to be referred to a Nutritionist or for Diabetic Management?  No   Diabetic Exams:  Diabetic Eye Exam: Overdue for diabetic eye exam. Pt has been advised about the importance in completing this exam. Patient advised to call and schedule an eye exam. Diabetic Foot Exam: Completed 08/28/22   Interpreter Needed?: No  Information entered by :: Lanier Ensign, LPN   Activities of Daily Living    01/15/2023    1:33 PM 10/09/2022    9:51 PM  In your present state of health, do you have any difficulty performing the following activities:  Hearing? 0 0  Vision? 0 0  Difficulty concentrating or making decisions? 0 0  Walking or climbing stairs? 0 0  Dressing or bathing? 0 0  Doing errands, shopping? 0 0  Preparing Food and eating ? N   Using the Toilet? N   In the past six months, have you accidently leaked urine? Y   Comment at times   Do you have problems with loss of  bowel control? N   Managing your Medications? N   Managing your Finances? N   Housekeeping or managing your Housekeeping? N     Patient Care Team: Jeoffrey Massed, MD as PCP - General (Family Medicine) Jens Som Madolyn Frieze, MD as PCP - Cardiology (Cardiology) Malissa Hippo, MD (Gastroenterology) Jens Som Madolyn Frieze, MD as Consulting Physician (Cardiology) Ernesto Rutherford, MD as Consulting Physician (Ophthalmology) Jerene Bears, MD (Obstetrics and Gynecology) Josephine Igo, MD (Inactive) (Orthopedic Surgery) Elesa Hacker, MD as Referring Physician (Dermatology) Hilda Lias, MD as Consulting Physician (Neurosurgery) Donnetta Hail, MD as Consulting Physician (Rheumatology) Osborn Coho, MD as Consulting Physician (Otolaryngology) Ollen Gross, MD as Consulting Physician (Orthopedic Surgery)  Indicate any recent  Medical Services you may have received from other than [facility] providers in the past year (date may be approximate).     Assessment:   This is a routine wellness examination for [place].  Hearing/Vision screen Hearing Screening - Comments:: Pt denies any hearing issues  Vision Screening - Comments:: Pt follows up with Dr Dione Booze for annual eye exams   Dietary issues and exercise activities discussed: Current Exercise Habits: The patient does not participate in regular exercise at present   Goals Addressed             This Visit's Progress    Patient Stated       None at this time        Depression Screen    01/15/2023    1:31 PM 11/28/2022    1:08 PM 06/24/2022    3:34 PM 02/20/2022    1:15 PM 12/11/2021    2:02 PM 08/21/2021   10:54 AM 11/29/2020   11:22 AM  PHQ 2/9 Scores  PHQ - 2 Score 0 0 0 0 0 0 0    Fall Risk    01/15/2023    1:33 PM 11/28/2022    1:08 PM 02/20/2022    1:15 PM 12/11/2021    2:02 PM 08/21/2021   10:53 AM  Fall Risk   Falls in the past year? 1 0 0 0 1  Number falls in past yr: 0 0 0 0 0  Injury with Fall? 0 0 0 0 1  Risk for fall due to : Impaired balance/gait;Impaired vision;Impaired mobility Impaired balance/gait;Impaired vision Impaired vision;Impaired balance/gait No Fall Risks   Follow up Falls prevention discussed Falls evaluation completed Falls evaluation completed Falls evaluation completed Falls evaluation completed    FALL RISK PREVENTION PERTAINING TO THE HOME:  Any stairs in or around the home? Yes  If so, are there any without handrails? No  Home free of loose throw rugs in walkways, pet beds, electrical cords, etc? Yes  Adequate lighting in your home to reduce risk of falls? Yes   ASSISTIVE DEVICES UTILIZED TO PREVENT FALLS:  Life alert? No  Use of a cane, walker or w/c? Yes  Grab bars in the bathroom? Yes  Shower chair or bench in shower? No  Elevated toilet seat or a handicapped toilet? No   TIMED UP AND GO:  Was the test  performed? No .   Cognitive Function:        01/15/2023    1:34 PM 12/11/2021    2:01 PM  6CIT Screen  What Year? 0 points 0 points  What month? 0 points 0 points  What time? 0 points 0 points  Count back from 20 0 points 0 points  Months in reverse 0 points 0 points  Repeat  phrase 0 points 0 points  Total Score 0 points 0 points    Immunizations Immunization History  Administered Date(s) Administered   Fluad Quad(high Dose 65+) 08/18/2019, 08/18/2020, 08/21/2021, 08/28/2022   Hepatitis B, ADULT 10/26/2020, 02/21/2021, 05/23/2021   Influenza Split 08/17/2012   Influenza Whole 06/30/2009, 07/12/2010   Influenza, High Dose Seasonal PF 08/29/2015, 09/09/2016, 08/13/2017, 06/29/2018   Influenza,inj,Quad PF,6+ Mos 06/08/2013, 08/18/2014   Moderna Sars-Covid-2 Vaccination 12/07/2019, 01/11/2020   Pneumococcal Conjugate-13 08/18/2014   Pneumococcal Polysaccharide-23 11/26/2016   Td 09/30/2008   Tdap 04/16/2019   Zoster Recombinat (Shingrix) 08/21/2021, 11/22/2021   Zoster, Live 08/17/2012    TDAP status: Up to date  Flu Vaccine status: Up to date  Pneumococcal vaccine status: Up to date  Covid-19 vaccine status: Completed vaccines  Qualifies for Shingles Vaccine? Yes   Zostavax completed Yes   Shingrix Completed?: Yes  Screening Tests Health Maintenance  Topic Date Due   COVID-19 Vaccine (3 - Moderna risk series) 02/08/2020   OPHTHALMOLOGY EXAM  07/03/2022   INFLUENZA VACCINE  05/01/2023   HEMOGLOBIN A1C  05/29/2023   Diabetic kidney evaluation - Urine ACR  08/29/2023   FOOT EXAM  08/29/2023   Diabetic kidney evaluation - eGFR measurement  01/08/2024   Medicare Annual Wellness (AWV)  01/15/2024   COLONOSCOPY (Pts 45-73yrs Insurance coverage will need to be confirmed)  09/15/2025   DTaP/Tdap/Td (3 - Td or Tdap) 04/15/2029   Pneumonia Vaccine 84+ Years old  Completed   DEXA SCAN  Completed   Hepatitis C Screening  Completed   Zoster Vaccines- Shingrix  Completed    HPV VACCINES  Aged Out    Health Maintenance  Health Maintenance Due  Topic Date Due   COVID-19 Vaccine (3 - Moderna risk series) 02/08/2020   OPHTHALMOLOGY EXAM  07/03/2022    Colorectal cancer screening: Type of screening: Colonoscopy. Completed 09/15/20. Repeat every 5 years  Mammogram status: Ordered 01/15/23. Pt provided with contact info and advised to call to schedule appt.   Bone Density status: Completed 11/05/22. Results reflect: Bone density results: OSTEOPENIA. Repeat every 2 years.    Additional Screening:  Hepatitis C Screening: Completed 09/13/20  Vision Screening: Recommended annual ophthalmology exams for early detection of glaucoma and other disorders of the eye. Is the patient up to date with their annual eye exam?  no Who is the provider or what is the name of the office in which the patient attends annual eye exams? Dr Dione Booze  If pt is not established with a provider, would they like to be referred to a provider to establish care? No .   Dental Screening: Recommended annual dental exams for proper oral hygiene  Community Resource Referral / Chronic Care Management: CRR required this visit?  No   CCM required this visit?  No      Plan:     I have personally reviewed and noted the following in the patient's chart:   Medical and social history Use of alcohol, tobacco or illicit drugs  Current medications and supplements including opioid prescriptions. Patient is not currently taking opioid prescriptions. Functional ability and status Nutritional status Physical activity Advanced directives List of other physicians Hospitalizations, surgeries, and ER visits in previous 12 months Vitals Screenings to include cognitive, depression, and falls Referrals and appointments  In addition, I have reviewed and discussed with patient certain preventive protocols, quality metrics, and best practice recommendations. A written personalized care plan for  preventive services as well as general preventive health recommendations  were provided to patient.     Marzella Schlein, LPN   [id number]/[id number]   Nurse Notes: none

## 2023-01-15 NOTE — Patient Instructions (Signed)
[name] , Thank you for taking time to come for your Medicare Wellness Visit. I appreciate your ongoing commitment to your health goals. Please review the following plan we discussed and let me know if I can assist you in the future.   These are the goals we discussed:  Goals      Patient Stated     Maintain current health        This is a list of the screening recommended for you and due dates:  Health Maintenance  Topic Date Due   COVID-19 Vaccine (3 - Moderna risk series) 02/08/2020   Eye exam for diabetics  07/03/2022   Flu Shot  05/01/2023   Hemoglobin A1C  05/29/2023   Yearly kidney health urinalysis for diabetes  08/29/2023   Complete foot exam   08/29/2023   Yearly kidney function blood test for diabetes  01/08/2024   Medicare Annual Wellness Visit  01/15/2024   Colon Cancer Screening  09/15/2025   DTaP/Tdap/Td vaccine (3 - Td or Tdap) 04/15/2029   Pneumonia Vaccine  Completed   DEXA scan (bone density measurement)  Completed   Hepatitis C Screening: USPSTF Recommendation to screen - Ages 58-79 yo.  Completed   Zoster (Shingles) Vaccine  Completed   HPV Vaccine  Aged Out    Advanced directives: Please bring a copy of your health care power of attorney and living will to the office at your convenience.   Conditions/risks identified: none at this time  Next appointment: Follow up in one year for your annual wellness visit    Preventive Care 65 Years and Older, Female Preventive care refers to lifestyle choices and visits with your health care provider that can promote health and wellness. What does preventive care include? A yearly physical exam. This is also called an annual well check. Dental exams once or twice a year. Routine eye exams. Ask your health care provider how often you should have your eyes checked. Personal lifestyle choices, including: Daily care of your teeth and gums. Regular physical activity. Eating a healthy diet. Avoiding tobacco and  drug use. Limiting alcohol use. Practicing safe sex. Taking low-dose aspirin every day. Taking vitamin and mineral supplements as recommended by your health care provider. What happens during an annual well check? The services and screenings done by your health care provider during your annual well check will depend on your age, overall health, lifestyle risk factors, and family history of disease. Counseling  Your health care provider may ask you questions about your: Alcohol use. Tobacco use. Drug use. Emotional well-being. Home and relationship well-being. Sexual activity. Eating habits. History of falls. Memory and ability to understand (cognition). Work and work Astronomer. Reproductive health. Screening  You may have the following tests or measurements: Height, weight, and BMI. Blood pressure. Lipid and cholesterol levels. These may be checked every 5 years, or more frequently if you are over 38 years old. Skin check. Lung cancer screening. You may have this screening every year starting at age 22 if you have a 30-pack-year history of smoking and currently smoke or have quit within the past 15 years. Fecal occult blood test (FOBT) of the stool. You may have this test every year starting at age 46. Flexible sigmoidoscopy or colonoscopy. You may have a sigmoidoscopy every 5 years or a colonoscopy every 10 years starting at age 62. Hepatitis C blood test. Hepatitis B blood test. Sexually transmitted disease (STD) testing. Diabetes screening. This is done by checking your blood sugar (  glucose) after you have not eaten for a while (fasting). You may have this done every 1-3 years. Bone density scan. This is done to screen for osteoporosis. You may have this done starting at age 64. Mammogram. This may be done every 1-2 years. Talk to your health care provider about how often you should have regular mammograms. Talk with your health care provider about your test results, treatment  options, and if necessary, the need for more tests. Vaccines  Your health care provider may recommend certain vaccines, such as: Influenza vaccine. This is recommended every year. Tetanus, diphtheria, and acellular pertussis (Tdap, Td) vaccine. You may need a Td booster every 10 years. Zoster vaccine. You may need this after age 50. Pneumococcal 13-valent conjugate (PCV13) vaccine. One dose is recommended after age 11. Pneumococcal polysaccharide (PPSV23) vaccine. One dose is recommended after age 49. Talk to your health care provider about which screenings and vaccines you need and how often you need them. This information is not intended to replace advice given to you by your health care provider. Make sure you discuss any questions you have with your health care provider. Document Released: 10/13/2015 Document Revised: 06/05/2016 Document Reviewed: 07/18/2015 Elsevier Interactive Patient Education  2017 [place] Prevention in the Home Falls can cause injuries. They can happen to people of all ages. There are many things you can do to make your home safe and to help prevent falls. What can I do on the outside of my home? Regularly fix the edges of walkways and driveways and fix any cracks. Remove anything that might make you trip as you walk through a door, such as a raised step or threshold. Trim any bushes or trees on the path to your home. Use bright outdoor lighting. Clear any walking paths of anything that might make someone trip, such as rocks or tools. Regularly check to see if handrails are loose or broken. Make sure that both sides of any steps have handrails. Any raised decks and porches should have guardrails on the edges. Have any leaves, snow, or ice cleared regularly. Use sand or salt on walking paths during winter. Clean up any spills in your garage right away. This includes oil or grease spills. What can I do in the bathroom? Use night lights. Install grab  bars by the toilet and in the tub and shower. Do not use towel bars as grab bars. Use non-skid mats or decals in the tub or shower. If you need to sit down in the shower, use a plastic, non-slip stool. Keep the floor dry. Clean up any water that spills on the floor as soon as it happens. Remove soap buildup in the tub or shower regularly. Attach bath mats securely with double-sided non-slip rug tape. Do not have throw rugs and other things on the floor that can make you trip. What can I do in the bedroom? Use night lights. Make sure that you have a light by your bed that is easy to reach. Do not use any sheets or blankets that are too big for your bed. They should not hang down onto the floor. Have a firm chair that has side arms. You can use this for support while you get dressed. Do not have throw rugs and other things on the floor that can make you trip. What can I do in the kitchen? Clean up any spills right away. Avoid walking on wet floors. Keep items that you use a lot in easy-to-reach places.  If you need to reach something above you, use a strong step stool that has a grab bar. Keep electrical cords out of the way. Do not use floor polish or wax that makes floors slippery. If you must use wax, use non-skid floor wax. Do not have throw rugs and other things on the floor that can make you trip. What can I do with my stairs? Do not leave any items on the stairs. Make sure that there are handrails on both sides of the stairs and use them. Fix handrails that are broken or loose. Make sure that handrails are as long as the stairways. Check any carpeting to make sure that it is firmly attached to the stairs. Fix any carpet that is loose or worn. Avoid having throw rugs at the top or bottom of the stairs. If you do have throw rugs, attach them to the floor with carpet tape. Make sure that you have a light switch at the top of the stairs and the bottom of the stairs. If you do not have them,  ask someone to add them for you. What else can I do to help prevent falls? Wear shoes that: Do not have high heels. Have rubber bottoms. Are comfortable and fit you well. Are closed at the toe. Do not wear sandals. If you use a stepladder: Make sure that it is fully opened. Do not climb a closed stepladder. Make sure that both sides of the stepladder are locked into place. Ask someone to hold it for you, if possible. Clearly mark and make sure that you can see: Any grab bars or handrails. First and last steps. Where the edge of each step is. Use tools that help you move around (mobility aids) if they are needed. These include: Canes. Walkers. Scooters. Crutches. Turn on the lights when you go into a dark area. Replace any light bulbs as soon as they burn out. Set up your furniture so you have a clear path. Avoid moving your furniture around. If any of your floors are uneven, fix them. If there are any pets around you, be aware of where they are. Review your medicines with your doctor. Some medicines can make you feel dizzy. This can increase your chance of falling. Ask your doctor what other things that you can do to help prevent falls. This information is not intended to replace advice given to you by your health care provider. Make sure you discuss any questions you have with your health care provider. Document Released: 07/13/2009 Document Revised: 02/22/2016 Document Reviewed: 10/21/2014 Elsevier Interactive Patient Education  2017 [employer].

## 2023-01-16 ENCOUNTER — Telehealth: Payer: Self-pay

## 2023-01-16 NOTE — Addendum Note (Signed)
Addended by: Gunnar Fusi A on: 01/16/2023 12:58 PM   Modules accepted: Orders

## 2023-01-16 NOTE — Telephone Encounter (Addendum)
Confirmed procedure date of 01/23/2023. Confirmed arrival time of 0900 for procedure time at 1130. Reviewed pre-procedure instructions with patient. She understands to skip Trulicity dose this Sunday, 4/21. She understands to take Invokana this Sunday, 4/21, then STOP. She understands to hold Xarelto the day before the procedure. She understands to take a half dose on insulin the night before the procedure. She understands to ONLY TAKE TOPROL the morning of the procedure. She was grateful for call and agreed with plan.   While on the phone, the patient reported she has not taken Elavil for years. Med list updated.

## 2023-01-23 ENCOUNTER — Inpatient Hospital Stay (HOSPITAL_COMMUNITY): Payer: Medicare PPO

## 2023-01-23 ENCOUNTER — Inpatient Hospital Stay (HOSPITAL_COMMUNITY)
Admission: RE | Admit: 2023-01-23 | Discharge: 2023-01-24 | DRG: 274 | Disposition: A | Discharge status: Home / Self Care | Payer: Medicare PPO | Attending: Cardiovascular Disease | Admitting: Cardiovascular Disease

## 2023-01-23 ENCOUNTER — Inpatient Hospital Stay (HOSPITAL_COMMUNITY): Payer: Medicare PPO | Admitting: Anesthesiology

## 2023-01-23 ENCOUNTER — Other Ambulatory Visit: Payer: Self-pay

## 2023-01-23 ENCOUNTER — Encounter (HOSPITAL_COMMUNITY)
Admission: RE | Disposition: A | Discharge status: Home / Self Care | Payer: Self-pay | Source: Home / Self Care | Attending: Cardiovascular Disease

## 2023-01-23 ENCOUNTER — Encounter (HOSPITAL_COMMUNITY): Payer: Self-pay | Admitting: Cardiovascular Disease

## 2023-01-23 DIAGNOSIS — K219 Gastro-esophageal reflux disease without esophagitis: Secondary | ICD-10-CM | POA: Diagnosis present

## 2023-01-23 DIAGNOSIS — K7460 Unspecified cirrhosis of liver: Secondary | ICD-10-CM | POA: Diagnosis present

## 2023-01-23 DIAGNOSIS — L7622 Postprocedural hemorrhage and hematoma of skin and subcutaneous tissue following other procedure: Secondary | ICD-10-CM | POA: Diagnosis not present

## 2023-01-23 DIAGNOSIS — I480 Paroxysmal atrial fibrillation: Principal | ICD-10-CM

## 2023-01-23 DIAGNOSIS — I10 Essential (primary) hypertension: Secondary | ICD-10-CM | POA: Diagnosis present

## 2023-01-23 DIAGNOSIS — Z8249 Family history of ischemic heart disease and other diseases of the circulatory system: Secondary | ICD-10-CM

## 2023-01-23 DIAGNOSIS — E1140 Type 2 diabetes mellitus with diabetic neuropathy, unspecified: Secondary | ICD-10-CM | POA: Diagnosis present

## 2023-01-23 DIAGNOSIS — M797 Fibromyalgia: Secondary | ICD-10-CM | POA: Diagnosis present

## 2023-01-23 DIAGNOSIS — Z006 Encounter for examination for normal comparison and control in clinical research program: Secondary | ICD-10-CM

## 2023-01-23 DIAGNOSIS — I4891 Unspecified atrial fibrillation: Principal | ICD-10-CM | POA: Diagnosis present

## 2023-01-23 DIAGNOSIS — F411 Generalized anxiety disorder: Secondary | ICD-10-CM | POA: Diagnosis present

## 2023-01-23 DIAGNOSIS — E785 Hyperlipidemia, unspecified: Secondary | ICD-10-CM | POA: Diagnosis present

## 2023-01-23 DIAGNOSIS — Z833 Family history of diabetes mellitus: Secondary | ICD-10-CM

## 2023-01-23 DIAGNOSIS — Z9049 Acquired absence of other specified parts of digestive tract: Secondary | ICD-10-CM

## 2023-01-23 DIAGNOSIS — Z95818 Presence of other cardiac implants and grafts: Secondary | ICD-10-CM

## 2023-01-23 DIAGNOSIS — Z6841 Body Mass Index (BMI) 40.0 and over, adult: Secondary | ICD-10-CM | POA: Diagnosis not present

## 2023-01-23 DIAGNOSIS — Z7901 Long term (current) use of anticoagulants: Secondary | ICD-10-CM | POA: Diagnosis not present

## 2023-01-23 DIAGNOSIS — Z794 Long term (current) use of insulin: Secondary | ICD-10-CM | POA: Diagnosis not present

## 2023-01-23 DIAGNOSIS — Z800 Family history of malignant neoplasm of digestive organs: Secondary | ICD-10-CM

## 2023-01-23 DIAGNOSIS — Y838 Other surgical procedures as the cause of abnormal reaction of the patient, or of later complication, without mention of misadventure at the time of the procedure: Secondary | ICD-10-CM | POA: Diagnosis not present

## 2023-01-23 DIAGNOSIS — Z01818 Encounter for other preprocedural examination: Secondary | ICD-10-CM | POA: Diagnosis not present

## 2023-01-23 DIAGNOSIS — Z90710 Acquired absence of both cervix and uterus: Secondary | ICD-10-CM

## 2023-01-23 DIAGNOSIS — E119 Type 2 diabetes mellitus without complications: Secondary | ICD-10-CM

## 2023-01-23 DIAGNOSIS — K5190 Ulcerative colitis, unspecified, without complications: Secondary | ICD-10-CM | POA: Diagnosis present

## 2023-01-23 DIAGNOSIS — Z825 Family history of asthma and other chronic lower respiratory diseases: Secondary | ICD-10-CM | POA: Diagnosis not present

## 2023-01-23 HISTORY — PX: TEE WITHOUT CARDIOVERSION: SHX5443

## 2023-01-23 HISTORY — PX: LEFT ATRIAL APPENDAGE OCCLUSION: EP1229

## 2023-01-23 LAB — GLUCOSE, CAPILLARY
Glucose-Capillary: 227 mg/dL — ABNORMAL HIGH (ref 70–99)
Glucose-Capillary: 243 mg/dL — ABNORMAL HIGH (ref 70–99)
Glucose-Capillary: 201 mg/dL — ABNORMAL HIGH (ref 70–99)
Glucose-Capillary: 275 mg/dL — ABNORMAL HIGH (ref 70–99)

## 2023-01-23 LAB — POCT ACTIVATED CLOTTING TIME
Activated Clotting Time: 239 s
Activated Clotting Time: 282 s

## 2023-01-23 LAB — TYPE AND SCREEN
ABO/RH(D): A POS
Antibody Screen: NEGATIVE

## 2023-01-23 LAB — SURGICAL PCR SCREEN
MRSA, PCR: NEGATIVE
Staphylococcus aureus: POSITIVE — AB

## 2023-01-23 LAB — ECHO TEE

## 2023-01-23 LAB — ABO/RH: ABO/RH(D): A POS

## 2023-01-23 SURGERY — LEFT ATRIAL APPENDAGE OCCLUSION
Anesthesia: General

## 2023-01-23 MED ORDER — INSULIN ASPART 100 UNIT/ML IJ SOLN
0.0000 [IU] | INTRAMUSCULAR | Status: AC | PRN
  Administered 2023-01-23: 4 [IU] via SUBCUTANEOUS

## 2023-01-23 MED ORDER — SODIUM CHLORIDE 0.9% FLUSH: 3.0000 mL | INTRAVENOUS | Status: DC | PRN

## 2023-01-23 MED ORDER — CHLORHEXIDINE GLUCONATE 0.12 % MT SOLN
OROMUCOSAL | Status: AC
  Administered 2023-01-23: 15 mL
  Filled 2023-01-23: qty 15

## 2023-01-23 MED ORDER — ONDANSETRON HCL 4 MG/2ML IJ SOLN: 4.0000 mg | Freq: Four times a day (QID) | INTRAMUSCULAR | Status: DC | PRN

## 2023-01-23 MED ORDER — FENTANYL CITRATE (PF) 100 MCG/2ML IJ SOLN
INTRAMUSCULAR | Status: AC
  Filled 2023-01-23: qty 2

## 2023-01-23 MED ORDER — METOPROLOL SUCCINATE ER 100 MG PO TB24
100.0000 mg | ORAL_TABLET | Freq: Two times a day (BID) | ORAL | Status: DC
  Administered 2023-01-23 – 2023-01-24 (×2): 100 mg via ORAL
  Filled 2023-01-23 (×3): qty 1

## 2023-01-23 MED ORDER — MUPIROCIN 2 % EX OINT
1.0000 | TOPICAL_OINTMENT | Freq: Two times a day (BID) | CUTANEOUS | Status: DC
  Administered 2023-01-23 – 2023-01-24 (×2): 1 via NASAL
  Filled 2023-01-23 (×2): qty 22

## 2023-01-23 MED ORDER — SUGAMMADEX SODIUM 200 MG/2ML IV SOLN
INTRAVENOUS | Status: DC | PRN
  Administered 2023-01-23: 250 mg via INTRAVENOUS

## 2023-01-23 MED ORDER — LIDOCAINE 2% (20 MG/ML) 5 ML SYRINGE
INTRAMUSCULAR | Status: DC | PRN
  Administered 2023-01-23: 100 mg via INTRAVENOUS

## 2023-01-23 MED ORDER — PROPOFOL 10 MG/ML IV BOLUS
INTRAVENOUS | Status: DC | PRN
  Administered 2023-01-23: 150 mg via INTRAVENOUS

## 2023-01-23 MED ORDER — SODIUM CHLORIDE 0.9% FLUSH
3.0000 mL | Freq: Two times a day (BID) | INTRAVENOUS | Status: DC
  Administered 2023-01-23 – 2023-01-24 (×2): 3 mL via INTRAVENOUS

## 2023-01-23 MED ORDER — TRAMADOL HCL 50 MG PO TABS: 50.0000 mg | ORAL_TABLET | Freq: Four times a day (QID) | ORAL | Status: DC | PRN

## 2023-01-23 MED ORDER — HEPARIN (PORCINE) IN NACL 2000-0.9 UNIT/L-% IV SOLN
INTRAVENOUS | Status: DC | PRN
  Administered 2023-01-23: 1000 mL

## 2023-01-23 MED ORDER — ONDANSETRON HCL 4 MG/2ML IJ SOLN
INTRAMUSCULAR | Status: DC | PRN
  Administered 2023-01-23: 4 mg via INTRAVENOUS

## 2023-01-23 MED ORDER — INSULIN ASPART 100 UNIT/ML IJ SOLN
INTRAMUSCULAR | Status: AC
  Administered 2023-01-23: 3 [IU] via SUBCUTANEOUS
  Filled 2023-01-23: qty 1

## 2023-01-23 MED ORDER — SODIUM CHLORIDE 0.9 % IV SOLN: 250.0000 mL | INTRAVENOUS | Status: DC | PRN

## 2023-01-23 MED ORDER — FENTANYL CITRATE (PF) 100 MCG/2ML IJ SOLN
25.0000 ug | INTRAMUSCULAR | Status: DC | PRN
  Administered 2023-01-23: 25 ug via INTRAVENOUS

## 2023-01-23 MED ORDER — FUROSEMIDE 40 MG PO TABS
40.0000 mg | ORAL_TABLET | Freq: Every day | ORAL | Status: DC
  Administered 2023-01-24: 40 mg via ORAL
  Filled 2023-01-23 (×2): qty 1

## 2023-01-23 MED ORDER — FENTANYL CITRATE PF 50 MCG/ML IJ SOSY
25.0000 ug | PREFILLED_SYRINGE | INTRAMUSCULAR | Status: DC | PRN
  Administered 2023-01-23: 25 ug via INTRAVENOUS
  Filled 2023-01-23: qty 1

## 2023-01-23 MED ORDER — RIVAROXABAN 20 MG PO TABS
20.0000 mg | ORAL_TABLET | Freq: Every day | ORAL | Status: DC
  Administered 2023-01-23: 20 mg via ORAL
  Filled 2023-01-23 (×2): qty 1

## 2023-01-23 MED ORDER — DIAZEPAM 2 MG PO TABS: 5.0000 mg | ORAL_TABLET | Freq: Three times a day (TID) | ORAL | Status: DC | PRN

## 2023-01-23 MED ORDER — CHLORHEXIDINE GLUCONATE 4 % EX LIQD
Freq: Once | CUTANEOUS | Status: DC
  Filled 2023-01-23: qty 15

## 2023-01-23 MED ORDER — DEXAMETHASONE SODIUM PHOSPHATE 10 MG/ML IJ SOLN
INTRAMUSCULAR | Status: DC | PRN
  Administered 2023-01-23: 5 mg via INTRAVENOUS

## 2023-01-23 MED ORDER — LIDOCAINE-EPINEPHRINE 1 %-1:100000 IJ SOLN: 30.0000 mL | Freq: Once | INTRAMUSCULAR | Status: AC

## 2023-01-23 MED ORDER — CEFAZOLIN SODIUM-DEXTROSE 2-4 GM/100ML-% IV SOLN
2.0000 g | INTRAVENOUS | Status: AC
  Administered 2023-01-23: 2 g via INTRAVENOUS
  Filled 2023-01-23: qty 100

## 2023-01-23 MED ORDER — METHOCARBAMOL 500 MG PO TABS
500.0000 mg | ORAL_TABLET | Freq: Three times a day (TID) | ORAL | Status: DC
  Administered 2023-01-23 – 2023-01-24 (×2): 500 mg via ORAL
  Filled 2023-01-23 (×3): qty 1

## 2023-01-23 MED ORDER — SODIUM CHLORIDE 0.9 % IV SOLN: INTRAVENOUS | Status: DC

## 2023-01-23 MED ORDER — PHENYLEPHRINE HCL-NACL 20-0.9 MG/250ML-% IV SOLN
INTRAVENOUS | Status: DC | PRN
  Administered 2023-01-23: 20 ug/min via INTRAVENOUS

## 2023-01-23 MED ORDER — HEPARIN SODIUM (PORCINE) 1000 UNIT/ML IJ SOLN
INTRAMUSCULAR | Status: DC | PRN
  Administered 2023-01-23: 10000 [IU] via INTRAVENOUS
  Administered 2023-01-23: 4000 [IU] via INTRAVENOUS

## 2023-01-23 MED ORDER — CHLORHEXIDINE GLUCONATE CLOTH 2 % EX PADS
6.0000 | MEDICATED_PAD | Freq: Every day | CUTANEOUS | Status: DC
  Administered 2023-01-23 – 2023-01-24 (×2): 6 via TOPICAL

## 2023-01-23 MED ORDER — HEPARIN (PORCINE) IN NACL 1000-0.9 UT/500ML-% IV SOLN
INTRAVENOUS | Status: DC | PRN
  Administered 2023-01-23: 500 mL

## 2023-01-23 MED ORDER — LIDOCAINE-EPINEPHRINE 1 %-1:100000 IJ SOLN
INTRAMUSCULAR | Status: AC
  Administered 2023-01-23: 6 mL
  Filled 2023-01-23: qty 1

## 2023-01-23 MED ORDER — INSULIN ASPART 100 UNIT/ML IJ SOLN
INTRAMUSCULAR | Status: AC
  Filled 2023-01-23: qty 1

## 2023-01-23 MED ORDER — ROCURONIUM BROMIDE 10 MG/ML (PF) SYRINGE
PREFILLED_SYRINGE | INTRAVENOUS | Status: DC | PRN
  Administered 2023-01-23: 70 mg via INTRAVENOUS
  Administered 2023-01-23: 10 mg via INTRAVENOUS

## 2023-01-23 MED ORDER — FENTANYL CITRATE (PF) 250 MCG/5ML IJ SOLN
INTRAMUSCULAR | Status: DC | PRN
  Administered 2023-01-23: 50 ug via INTRAVENOUS

## 2023-01-23 MED ORDER — PROTAMINE SULFATE 10 MG/ML IV SOLN
INTRAVENOUS | Status: DC | PRN
  Administered 2023-01-23: 30 mg via INTRAVENOUS

## 2023-01-23 SURGICAL SUPPLY — 22 items
BLANKET WARM UNDERBOD FULL ACC (MISCELLANEOUS) ×1 IMPLANT
CATH DIAG 6FR PIGTAIL ANGLED (CATHETERS) IMPLANT
CLOSURE PERCLOSE PROSTYLE (VASCULAR PRODUCTS) IMPLANT
DEVICE WATCHMAN FLX PRO PROC (KITS) IMPLANT
DEVICE WATCHMAN FXD CRV PROC (KITS) IMPLANT
ELECT DEFIB PAD ADLT CADENCE (PAD) IMPLANT
KIT HEART LEFT (KITS) ×1 IMPLANT
KIT MICROPUNCTURE NIT STIFF (SHEATH) IMPLANT
KIT SHEA VERSACROSS LAAC CONNE (KITS) IMPLANT
PACK CARDIAC CATHETERIZATION (CUSTOM PROCEDURE TRAY) ×1 IMPLANT
PAD DEFIB RADIO PHYSIO CONN (PAD) ×1 IMPLANT
SHEATH PERFORMER 16FR 30 (SHEATH) IMPLANT
SHEATH PINNACLE 8F 10CM (SHEATH) IMPLANT
SHEATH PROBE COVER 6X72 (BAG) ×1 IMPLANT
SYS WATCHMAN FXD DBL (SHEATH) ×1
SYSTEM WATCHMAN FXD DBL (SHEATH) IMPLANT
TRANSDUCER W/STOPCOCK (MISCELLANEOUS) ×1 IMPLANT
TUBING CIL FLEX 10 FLL-RA (TUBING) ×1 IMPLANT
WATCHMAN FLX PRO 24 (Prosthesis & Implant Heart) IMPLANT
WATCHMAN FLX PRO PROCEDURE (KITS) ×1 IMPLANT
WATCHMAN FXD CRV SYS PROCEDURE (KITS) ×1 IMPLANT
WIRE EMERALD 3MM-J .035X150CM (WIRE) IMPLANT

## 2023-01-23 NOTE — Progress Notes (Signed)
Pt remains in afib, heart 120-130's.  VSS.  Evening metroprolol given at 2047.  Cardiology paged.

## 2023-01-23 NOTE — Anesthesia Preprocedure Evaluation (Signed)
Anesthesia Evaluation  Patient identified by MRN, date of birth, ID band Patient awake    Reviewed: Allergy & Precautions, H&P , NPO status , Patient's Chart, lab work & pertinent test results  Airway Mallampati: II  TM Distance: <3 FB Neck ROM: Full    Dental no notable dental hx.    Pulmonary neg pulmonary ROS   Pulmonary exam normal breath sounds clear to auscultation       Cardiovascular hypertension, Pt. on medications and Pt. on home beta blockers Normal cardiovascular exam+ dysrhythmias Atrial Fibrillation  Rhythm:Regular Rate:Normal     Neuro/Psych negative neurological ROS  negative psych ROS   GI/Hepatic Neg liver ROS, PUD,GERD  Medicated,,  Endo/Other  diabetes, Insulin Dependent  Morbid obesity  Renal/GU negative Renal ROS  negative genitourinary   Musculoskeletal negative musculoskeletal ROS (+)    Abdominal   Peds negative pediatric ROS (+)  Hematology negative hematology ROS (+)   Anesthesia Other Findings   Reproductive/Obstetrics negative OB ROS                             Anesthesia Physical Anesthesia Plan  ASA: 3  Anesthesia Plan: General   Post-op Pain Management: Minimal or no pain anticipated   Induction: Intravenous  PONV Risk Score and Plan: 3 and Ondansetron, Dexamethasone and Treatment may vary due to age or medical condition  Airway Management Planned: Oral ETT  Additional Equipment: ClearSight  Intra-op Plan:   Post-operative Plan: Extubation in OR  Informed Consent: I have reviewed the patients History and Physical, chart, labs and discussed the procedure including the risks, benefits and alternatives for the proposed anesthesia with the patient or authorized representative who has indicated his/her understanding and acceptance.     Dental advisory given  Plan Discussed with: CRNA and Surgeon  Anesthesia Plan Comments:         Anesthesia Quick Evaluation

## 2023-01-23 NOTE — Op Note (Signed)
  [facility]   MULTIDISCIPLINARY HEART VALVE TEAM  SURGEON: [name], MD   TEE:  Thurmon Fair, MD  PREPROCEDURE DIAGNOSIS: 1. Paroxysmal Atrial fibrillation 2. epistaxis   POSTPROCEDURE DIAGNOSIS:   1. Paroxysmal Atrial fibrillation 2. epistaxis   PROCEDURES:  1. Transseptal puncture with RF energy 2. Transesophageal echocardiogram 3. Left atrial appendage occlusive device placement.  INTRODUCTION:  [name] is a 77 y.o. female with a history of atrial fibrillation who presents for left atrial appendage occlusive device placement.  She has undergone shared decision making consultation and has consented to Watchman implantation  DESCRIPTION OF PROCEDURE: Informed written consent was obtained, and the patient was brought to the hybrid catheterization lab in a fasting state. The patient received general anesthesia as outlined in the anesthesia report.  TEE was performed which revealed no pericardial effusion or LAA thrombus.    Using direct US guidance and a micropuncture technique, the right femoral vein is accessed via a front wall puncture. 2 Perclose devices are deployed at 10' and 2' positions, and a 16-French sheath is placed in the right common femoral vein.  [place] images are captured and digitally stored in the patient's chart. A Baylis Versacross Connect transseptal sheath is advanced through the RCFV into the SVC.  Heparin is administered and a therapeutic ACT is achieved. Using fluroscopic guidance and TEE, transseptal puncture was performed which the puncture made in the inferior and mid portion of the intraatrial septum.  Transseptal puncture was confirmed by LA pressure measurement and TEE under fluoroscopic guidance.   The [facility] double curve 74F access system is positioned in the mid-LA (lot # [id number]). A 6 F pigtail was introduced through the access guide and advanced under fluoroscopy and TEE guidance into the left atrial  appendage.  An angiogram was then performed by hand injection of nonionic contrast into the left atrial appendage.  The pigtail and access guide were then advanced to the tip of the appendage.  The pigtail was then removed and exchanged for a [employer] WATCHMAN FLX Device Pro 24 mm (LOT # [id number]) device.  The device is deployed using standard technique. Good position of the device was noted.   PASS Criteria: Final position of the WATCHMAN device revealed that it was located at the LAA ostium.  Tug test was performed and adequate.  Measurements by TEE revealed compression of 23-27%.  Doppler revealed no leak.  Fluoroscopy with angiogram also revealed no leak.  The device was deployed into the LAA using a standard technique.  The access system was then removed from the body and the Perclose sutures are tightened. The sheaths were removed and hemostasis was assured. EBL<62ml.  There were no early apparent complications.   CONCLUSIONS:  Successful implantation of a 24 mm WATCHMAN FLX Pro left atrial appendage occlusive device   2.   No early apparent complications.   Anticoagulation Recommendations: Resume Xarelto today at your normal dosing schedule. Stop Xarelto and start plavix monotherapy in 6 weeks. Cardiac CTA 8-10 weeks for surveillance.  [name] MD 01/23/2023 1:32 PM

## 2023-01-23 NOTE — Discharge Summary (Addendum)
[facility]    Patient ID: [name],  MRN: [id number], DOB/AGE: 1946-04-27 77 y.o.  Admit date: 01/23/2023 Discharge date: 01/23/2023  Primary Care Physician: Jeoffrey Massed, MD  Primary Cardiologist: Olga Millers, MD  Electrophysiologist: None  Primary Discharge Diagnosis:  Paroxysmal Atrial Fibrillation Poor candidacy for long term anticoagulation due to frequent nosebleeds  SURGEON: [name], MD   TEE:  Thurmon Fair, MD   PREPROCEDURE DIAGNOSIS: 1. Paroxysmal Atrial fibrillation 2. epistaxis    POSTPROCEDURE DIAGNOSIS:   1. Paroxysmal Atrial fibrillation 2. epistaxis    PROCEDURES:  1. Transseptal puncture with RF energy 2. Transesophageal echocardiogram 3. Left atrial appendage occlusive device placement.  CONCLUSIONS:  Successful implantation of a 24 mm WATCHMAN FLX Pro left atrial appendage occlusive device   2.   No early apparent complications.    Anticoagulation Recommendations: Resume Xarelto today at your normal dosing schedule. Stop Xarelto and start plavix monotherapy in 6 weeks. Cardiac CTA 8-10 weeks for surveillance.   Brief HPI: [name] is a 77 y.o. female with a history of PAF with hx of epistaxis while taking DOAC who was referred to Dr. Excell Seltzer for LAAO closure with Watchman evaluation.     Ms. Stembridge follows with Dr. Jens Som for her cardiology care and reported daily nosebleeds while taking Xarelto for her atrial fibrillation. In consult with Dr. Excell Seltzer she was interested and underwent pre procedure CT imagining that showed anatomy suitable for 31mm Watchman FLX device. She is now scheduled for her procedure 01/22/23   Hospital Course:  The patient was admitted and underwent left atrial appendage occlusive device placement with 24mm Watchman FLX Pro device. She was monitored post procedure and she has done well and is therefore being considered for same day discharge. Groin site has been without complication.  Wound care and restrictions were reviewed with the patient. The patient has been scheduled for post procedure follow up with Georgie Chard, NP in approximately 1 month. She will be resumed on Xarelto later today and will continue for 45 days. At that point she will stop and start Plavix monotherapy for 6 months duration. CT imaging will be performed at 60 days to ensure proper device seal and no device thrombus.   ADDEND: Initial plan was to discharge the patient after LAAO closure however noted to have right groin bleeding and oozing requiring manual pressure and Lido/Epi 1:100000 injection. Groin site very stable today with no recurrent bleeding. Post procedure instructions re-reviewed with the patient and husband. All questions answered.   Physical Exam: Vitals:   01/23/23 1238 01/23/23 1243 01/23/23 1248 01/23/23 1324  BP:      Pulse: 60 61 60   Resp:      Temp:    98.2 F (36.8 C)  TempSrc:    Temporal  SpO2:      Weight:      Height:       General: Well developed, well nourished, NAD Lungs:Clear to ausculation bilaterally. Cardiovascular: No murmurs Extremities: No edema. Groin site stable.  Neuro: Alert and oriented. No focal deficits. No facial asymmetry. MAE spontaneously. Psych: Responds to questions appropriately with normal affect.    Labs:   Lab Results  Component Value Date   WBC 6.0 01/08/2023   HGB 11.2 01/08/2023   HCT 34.1 01/08/2023   MCV 96 01/08/2023   PLT 141 (L) 01/08/2023   No results for input(s): "NA", "K", "CL", "CO2", "BUN", "CREATININE", "CALCIUM", "PROT", "BILITOT", "ALKPHOS", "ALT", "  AST", "GLUCOSE" in the last 168 hours.  Invalid input(s): "LABALBU"   Discharge Medications:  Allergies as of 01/23/2023       Reactions   Omeprazole Anaphylaxis, Swelling   SWELLING OF TONGUE AND THROAT   Actos [pioglitazone] Swelling, Other (See Comments)   Weight gain, tongue swelling   Benzocaine-menthol Swelling   SWELLING OF MOUTH   Colesevelam Other  (See Comments)   GI UPSET   Flagyl [metronidazole Hcl] Other (See Comments)   DIAPHORESIS   Metformin And Related Diarrhea   Shrimp [shellfish Allergy] Itching   OF THROAT AND EARS, if consumed raw   Statins Palpitations   Allevyn Adhesive [wound Dressings]    Other reaction(s): Unknown   Desipramine Hcl Itching, Nausea Only, Other (See Comments)   "swimmy" headed, ears itched   Hydromorphone Itching   Jardiance [empagliflozin] Other (See Comments)   weakness   Lactose Intolerance (gi) Diarrhea   Gas, bloating   Adhesive [tape] Other (See Comments)   SKIN IRRITATION AND BRUISING   Nisoldipine Itching   Percocet [oxycodone-acetaminophen] Itching        Medication List     TAKE these medications    Accu-Chek Guide test strip Generic drug: glucose blood USE TO CHECK BLOOD GLUCOSE UP TO 6 TIMES DAILY AS DIRECTED   Accu-Chek Softclix Lancets lancets USE TO CHECK BLOOD GLUCOSE UP TO 6 TIMES DAILY AS DIRECTED   acetaminophen 325 MG tablet Commonly known as: TYLENOL Take 650 mg by mouth every 6 (six) hours as needed for moderate pain or headache.   B-D UF III MINI PEN NEEDLES 31G X 5 MM Misc Generic drug: Insulin Pen Needle USE TO INJECT INSULINS EQUAL TO 6 TIMES DAILY.   blood glucose meter kit and supplies Kit Use up to six times daily as directed. DX. E11.9   citalopram 20 MG tablet Commonly known as: CELEXA TAKE 1 TABLET BY MOUTH EVERY DAY   dicyclomine 10 MG capsule Commonly known as: BENTYL TAKE 1 CAPSULE (10 MG TOTAL) BY MOUTH EVERY 8 (EIGHT) HOURS AS NEEDED FOR SPASMS (ABDOMINAL PAIN).   diphenoxylate-atropine 2.5-0.025 MG tablet Commonly known as: LOMOTIL Take 1 tablet by mouth 4 (four) times daily as needed for diarrhea or loose stools.   esomeprazole 20 MG capsule Commonly known as: NEXIUM TAKE 1 CAPSULE BY MOUTH DAILY BEFORE BREAKFAST What changed:  how much to take how to take this when to take this   Flintstones Complete 18 MG Chew Chew 1  tablet by mouth 2 (two) times daily.   furosemide 40 MG tablet Commonly known as: LASIX Take 1 tablet (40 mg total) by mouth daily.   gabapentin 600 MG tablet Commonly known as: NEURONTIN TAKE 1 AND 1/2 TABLETS BY MOUTH TWICE A DAY What changed: See the new instructions.   HumuLIN R U-500 KwikPen 500 UNIT/ML KwikPen Generic drug: insulin regular human CONCENTRATED DIAL AND INJECT 100 UNITS UNDER THE SKIN WITH BREAKFAST AND DINNER AND 45 UNITS IN EVENING. What changed:  how much to take how to take this when to take this additional instructions   INFLIXIMAB IV Inject into the vein every 8 (eight) weeks. Remicaid 5mg /kg Infusion every 8 weeks [facility])   Injectafer 750 MG/15ML Soln injection Generic drug: ferric carboxymaltose Inject 15 mLs (750 mg total) into the vein as directed for 1 dose. Repeat in 7 days   Invokana 100 MG Tabs tablet Generic drug: canagliflozin TAKE 1 TABLET BY MOUTH EVERY DAY BEFORE BREAKFAST What changed: See the  new instructions.   lactase 3000 units tablet Commonly known as: LACTAID Take 3,000 Units by mouth as needed (when eating foods containing dairy).   meclizine 25 MG tablet Commonly known as: ANTIVERT Take 1 tablet (25 mg total) by mouth 3 (three) times daily as needed for dizziness or nausea.   methocarbamol 500 MG tablet Commonly known as: ROBAXIN Take 1 tablet (500 mg total) by mouth 3 (three) times daily. May cause drowsiness   metoprolol succinate 100 MG 24 hr tablet Commonly known as: TOPROL-XL TAKE 1 TABLET BY MOUTH 2 TIMES DAILY. TAKE WITH OR IMMEDIATELY FOLLOWING A MEAL What changed: See the new instructions.   potassium chloride 10 MEQ tablet Commonly known as: KLOR-CON Take 10 mEq by mouth daily. Pt takes 1 tablet 10 MEQ once a day.   rivaroxaban 20 MG Tabs tablet Commonly known as: XARELTO Take 1 tablet (20 mg total) by mouth daily with supper. Notes to patient: Resume Xarelto this evening, 01/23/23    Trulicity 1.5 MG/0.5ML Sopn Generic drug: Dulaglutide INJECT 1.5MG  ONCE WEEKLY What changed:  how much to take how to take this when to take this        Disposition:  Home  Discharge Instructions     Call MD for:  difficulty breathing, headache or visual disturbances   Complete by: As directed    Call MD for:  extreme fatigue   Complete by: As directed    Call MD for:  hives   Complete by: As directed    Call MD for:  persistant dizziness or light-headedness   Complete by: As directed    Call MD for:  persistant nausea and vomiting   Complete by: As directed    Call MD for:  redness, tenderness, or signs of infection (pain, swelling, redness, odor or green/yellow discharge around incision site)   Complete by: As directed    Call MD for:  severe uncontrolled pain   Complete by: As directed    Call MD for:  temperature >100.4   Complete by: As directed    Diet - low sodium heart healthy   Complete by: As directed    Discharge instructions   Complete by: As directed    [facility] Procedure, Care After  Procedure MD: Dr. Elmer Sow Clinical Coordinator: Karsten Fells, RN  This sheet gives you information about how to care for yourself after your procedure. Your health care provider may also give you more specific instructions. If you have problems or questions, contact your health care provider.  What can I expect after the procedure? After the procedure, it is common to have: Bruising around your puncture site. Tenderness around your puncture site. Tiredness (fatigue).  Medication instructions It is very important to continue to take your blood thinner as directed by your doctor after the Watchman procedure. Call your procedure doctor's office with question or concerns. If you are on Coumadin (warfarin), you will have your INR checked the week after your procedure, with a goal INR of 2.0 - 3.0. Please follow your medication instructions on your discharge summary.  Only take the medications listed on your discharge paperwork.  Follow up You will be seen in 1 month after your procedure You will have a repeat CT scan approximately 8 weeks after your procedure mark to check your device You will follow up the MD/APP who performed your procedure 6 months after your procedure The Watchman Clinical Coordinator will check in with you from time to time, including 1 and 2  years after your procedure.    Follow these instructions at home: Puncture site care  Follow instructions from your health care provider about how to take care of your puncture site. Make sure you: If present, leave stitches (sutures), skin glue, or adhesive strips in place.  If a large square bandage is present, this may be removed 24 hours after surgery.  Check your puncture site every day for signs of infection. Check for: Redness, swelling, or pain. Fluid or blood. If your puncture site starts to bleed, lie down on your back, apply firm pressure to the area, and contact your health care provider. Warmth. Pus or a bad smell. Driving Do not drive yourself home if you received sedation Do not drive for at least 4 days after your procedure or however long your health care provider recommends. (Do not resume driving if you have previously been instructed not to drive for other health reasons.) Do not spend greater than 1 hour at a time in a car for the first 3 days. Stop and take a break with a 5 minute walk at least every hour.  Do not drive or use heavy machinery while taking prescription pain medicine.  Activity Avoid activities that take a lot of effort, including exercise, for at least 7 days after your procedure. For the first 3 days, avoid sitting for longer than one hour at a time.  Avoid alcoholic beverages, signing paperwork, or participating in legal proceedings for 24 hours after receiving sedation Do not lift anything that is heavier than 10 lb (4.5 kg) for one week.  No sexual  activity for 1 week.  Return to your normal activities as told by your health care provider. Ask your health care provider what activities are safe for you. General instructions Take over-the-counter and prescription medicines only as told by your health care provider. Do not use any products that contain nicotine or tobacco, such as cigarettes and e-cigarettes. If you need help quitting, ask your health care provider. You may shower after 24 hours, but Do not take baths, swim, or use a hot tub for 1 week.  Do not drink alcohol for 24 hours after your procedure. Keep all follow-up visits as told by your health care provider. This is important. Dental Work: You will require antibiotics prior to any dental work, including cleanings, for 6 months after your Watchman implantation to help protect you from infection. After 6 months, antibiotics are no longer required. Contact a health care provider if: You have redness, mild swelling, or pain around your puncture site. You have soreness in your throat or at your puncture site that does not improve after several days You have fluid or blood coming from your puncture site that stops after applying firm pressure to the area. Your puncture site feels warm to the touch. You have pus or a bad smell coming from your puncture site. You have a fever. You have chest pain or discomfort that spreads to your neck, jaw, or arm. You are sweating a lot. You feel nauseous. You have a fast or irregular heartbeat. You have shortness of breath. You are dizzy or light-headed and feel the need to lie down. You have pain or numbness in the arm or leg closest to your puncture site. Get help right away if: Your puncture site suddenly swells. Your puncture site is bleeding and the bleeding does not stop after applying firm pressure to the area. These symptoms may represent a serious problem that is an emergency.  Do not wait to see if the symptoms will go away. Get medical  help right away. Call your local emergency services (911 in the [place] Do not drive yourself to the hospital. Summary After the procedure, it is normal to have bruising and tenderness at the puncture site in your groin, neck, or forearm. Check your puncture site every day for signs of infection. Get help right away if your puncture site is bleeding and the bleeding does not stop after applying firm pressure to the area. This is a medical emergency.  This information is not intended to replace advice given to you by your health care provider. Make sure you discuss any questions you have with your health care provider.   Increase activity slowly   Complete by: As directed        Follow-up Information     Janetta Hora, PA-C Follow up on 02/28/2023.   Specialties: Cardiology, Radiology Why: @ 1105. Please arrive at 1050am. Contact information: [address] [place] [address] [phone]                 Duration of Discharge Encounter: Greater than 30 minutes including physician time.  Signed, Georgie Chard, NP  01/23/2023 3:07 PM  Patient seen, examined. Available data reviewed. Agree with findings, assessment, and plan as outlined by Georgie Chard, NP.  The patient is independently interviewed and examined this morning.  Her husband is at the bedside.  She has no chest pain, shortness of breath, or heart palpitations.  She is alert, oriented, in no distress.  Heart is irregularly irregular with no murmur gallop, abdomen soft nontender, extremities have no edema, the right groin is clear with no hematoma or ecchymosis.  The patient has done well with watchman implantation and no immediate complications.  I reviewed postprocedural instructions with her.  She is back on rivaroxaban and will continue through 6 weeks, then transition to clopidogrel monotherapy.  Medically stable for discharge today.  [name], M.D. 01/24/2023 2:27 PM

## 2023-01-23 NOTE — Progress Notes (Signed)
PT gown saturated from groin site. Pressure held to stop bleeding/ Noreene Larsson, NP paged and at bedside. Lidocaine with epi ordered and injected. Bleeding subsided. DR. Excell Seltzer made aware. Pt admitted to 2c for overnight for observation.

## 2023-01-23 NOTE — Progress Notes (Signed)
  [facility]   MULTIDISCIPLINARY HEART VALVE TEAM  Patient doing well s/p LAAO. She is hemodynamically stable. Groin sites stable. Plan for early ambulation after bedrest completed and hopeful discharge later today.   Ronnell Clinger NP-C Structural Heart Team  Pager: [phone] Phone: [phone]  

## 2023-01-23 NOTE — Anesthesia Procedure Notes (Signed)
Procedure Name: Intubation Date/Time: 01/23/2023 11:35 AM  Performed by: Lanell Matar, CRNAPre-anesthesia Checklist: Patient identified, Emergency Drugs available, Suction available and Patient being monitored Patient Re-evaluated:Patient Re-evaluated prior to induction Oxygen Delivery Method: Circle System Utilized Preoxygenation: Pre-oxygenation with 100% oxygen Induction Type: IV induction Ventilation: Mask ventilation without difficulty Laryngoscope Size: Miller and 2 Grade View: Grade I Tube type: Oral Tube size: 7.0 mm Number of attempts: 1 Airway Equipment and Method: Stylet and Oral airway Placement Confirmation: ETT inserted through vocal cords under direct vision, positive ETCO2 and breath sounds checked- equal and bilateral Secured at: 21 cm Tube secured with: Tape Dental Injury: Teeth and Oropharynx as per pre-operative assessment

## 2023-01-23 NOTE — Plan of Care (Signed)

## 2023-01-23 NOTE — Progress Notes (Signed)
  [facility]   MULTIDISCIPLINARY HEART VALVE TEAM   Called by Cath lab holding with concern about patients right groin. RN holding pressure and small hematoma and oozing noted. Continued to hold manual pressure and 6ml of Lido/epi 1:100000 given at right groin site with improvement. Groin re-dressed with 4x4 and tegaderm. Bedrest will restart and end at 1945. Will likely cancel discharge order and patient will be kept overnight for additional monitoring. Plan for earl AM discharge tomorrow if groin remains stable.    Georgie Chard NP-C Structural Heart Team  Pager: [phone] Phone: [phone]

## 2023-01-23 NOTE — Interval H&P Note (Signed)
History and Physical Interval Note:  01/23/2023 9:43 AM  [name]  has presented today for surgery, with the diagnosis of afib.  The various methods of treatment have been discussed with the patient and family. After consideration of risks, benefits and other options for treatment, the patient has consented to  Procedure(s): LEFT ATRIAL APPENDAGE OCCLUSION (N/A) TRANSESOPHAGEAL ECHOCARDIOGRAM (N/A) as a surgical intervention.  The patient's history has been reviewed, patient examined, no change in status, stable for surgery.  I have reviewed the patient's chart and labs.  Questions were answered to the patient's satisfaction.     [name]

## 2023-01-23 NOTE — Transfer of Care (Signed)
Immediate Anesthesia Transfer of Care Note  Patient: [name]  Procedure(s) Performed: LEFT ATRIAL APPENDAGE OCCLUSION TRANSESOPHAGEAL ECHOCARDIOGRAM  Patient Location: Cath Lab  Anesthesia Type:General  Level of Consciousness: awake, drowsy, and patient cooperative  Airway & Oxygen Therapy: Patient Spontanous Breathing and Patient connected to face mask oxygen  Post-op Assessment: Report given to RN, Post -op Vital signs reviewed and stable, and Patient moving all extremities X 4  Post vital signs: Reviewed and stable  Last Vitals:  Vitals Value Taken Time  BP    Temp    Pulse    Resp    SpO2      Last Pain:  Vitals:   01/23/23 0951  TempSrc:   PainSc: 0-No pain         Complications: There were no known notable events for this encounter.

## 2023-01-24 ENCOUNTER — Other Ambulatory Visit: Payer: Self-pay | Admitting: Family Medicine

## 2023-01-24 ENCOUNTER — Encounter (HOSPITAL_COMMUNITY): Payer: Self-pay | Admitting: Cardiovascular Disease

## 2023-01-24 ENCOUNTER — Telehealth: Payer: Self-pay | Admitting: Family Medicine

## 2023-01-24 DIAGNOSIS — E1140 Type 2 diabetes mellitus with diabetic neuropathy, unspecified: Secondary | ICD-10-CM

## 2023-01-24 DIAGNOSIS — I480 Paroxysmal atrial fibrillation: Secondary | ICD-10-CM | POA: Diagnosis not present

## 2023-01-24 NOTE — Telephone Encounter (Signed)
[name] states that she was recently seen in the hospital and she has  some concerns about what she can do to hold her urine. She decline an appointment and ask that Moshe Cipro give her a call.

## 2023-01-24 NOTE — Plan of Care (Signed)
  Problem: Education: Goal: Knowledge of cardiac device and self-care will improve Outcome: Adequate for Discharge Goal: Ability to safely manage health related needs after discharge will improve Outcome: Adequate for Discharge Goal: Individualized Educational Video(s) Outcome: Adequate for Discharge   Problem: Cardiac: Goal: Ability to achieve and maintain adequate cardiopulmonary perfusion will improve Outcome: Adequate for Discharge   Problem: Education: Goal: Knowledge of General Education information will improve Description: Including pain rating scale, medication(s)/side effects and non-pharmacologic comfort measures Outcome: Adequate for Discharge   Problem: Health Behavior/Discharge Planning: Goal: Ability to manage health-related needs will improve Outcome: Adequate for Discharge   Problem: Clinical Measurements: Goal: Ability to maintain clinical measurements within normal limits will improve Outcome: Adequate for Discharge Goal: Will remain free from infection Outcome: Adequate for Discharge Goal: Diagnostic test results will improve Outcome: Adequate for Discharge Goal: Respiratory complications will improve Outcome: Adequate for Discharge Goal: Cardiovascular complication will be avoided Outcome: Adequate for Discharge   Problem: Activity: Goal: Risk for activity intolerance will decrease Outcome: Adequate for Discharge   Problem: Nutrition: Goal: Adequate nutrition will be maintained Outcome: Adequate for Discharge   Problem: Coping: Goal: Level of anxiety will decrease Outcome: Adequate for Discharge   Problem: Elimination: Goal: Will not experience complications related to bowel motility Outcome: Adequate for Discharge Goal: Will not experience complications related to urinary retention Outcome: Adequate for Discharge   Problem: Pain Managment: Goal: General experience of comfort will improve Outcome: Adequate for Discharge   Problem:  Safety: Goal: Ability to remain free from injury will improve Outcome: Adequate for Discharge   Problem: Skin Integrity: Goal: Risk for impaired skin integrity will decrease Outcome: Adequate for Discharge   

## 2023-01-24 NOTE — Care Management (Signed)
The Transition of Care Department (TOC) has reviewed patient and no TOC needs have been identified at this time. We will continue to monitor patient advancement through interdisciplinary progression rounds. If new patient transition needs arise, please place a TOC consult °

## 2023-01-24 NOTE — Anesthesia Postprocedure Evaluation (Signed)
Anesthesia Post Note  Patient: [name]  Procedure(s) Performed: LEFT ATRIAL APPENDAGE OCCLUSION TRANSESOPHAGEAL ECHOCARDIOGRAM     Patient location during evaluation: PACU Anesthesia Type: General Level of consciousness: awake and alert Pain management: pain level controlled Vital Signs Assessment: post-procedure vital signs reviewed and stable Respiratory status: spontaneous breathing, nonlabored ventilation, respiratory function stable and patient connected to nasal cannula oxygen Cardiovascular status: blood pressure returned to baseline and stable Postop Assessment: no apparent nausea or vomiting Anesthetic complications: no  There were no known notable events for this encounter.  Last Vitals:  Vitals:   01/24/23 0529 01/24/23 0726  BP: (!) 145/94 131/87  Pulse: 100 94  Resp: 16 18  Temp: 36.6 C 36.6 C  SpO2: 99% 99%    Last Pain:  Vitals:   01/24/23 0726  TempSrc: Oral  PainSc:                  Tony Granquist S

## 2023-01-24 NOTE — Progress Notes (Signed)
Patient and spouse given discharge instructions and verbalized understanding. Patient dressed with assistance and discharged home with family.  PIV removed by staff.

## 2023-01-24 NOTE — Telephone Encounter (Signed)
Pt's husband advised no recommendations to provide. She may need a follow up with our office in the near future, if so someone will call to discuss details.

## 2023-01-27 ENCOUNTER — Telehealth: Payer: Self-pay

## 2023-01-27 NOTE — Telephone Encounter (Signed)
  [facility]   Watchman Team  Contacted the patient regarding discharge from [facility] on 01/24/2023  The patient understands to follow up with Georgie Chard on 02/28/2023 in preparation for imaging on 03/27/2023.  The patient understands discharge instructions? Yes  The patient understands medications and regimen? Yes   The patient reports groin site looks healthy with no S/S of infection or bleeding.  The patient understands to call with any questions or concerns prior to scheduled visit.

## 2023-01-27 NOTE — Transitions of Care (Post Inpatient/ED Visit) (Signed)
   01/27/2023  Name: [name] MRN: [id number] DOB: 1946-08-30  Today's TOC FU Call Status: Today's TOC FU Call Status:: Successful TOC FU Call Competed TOC FU Call Complete Date: 01/27/23  Transition Care Management Follow-up Telephone Call Date of Discharge: 01/24/23 Discharge Facility: [facility]) Type of Discharge: Inpatient Admission Primary Inpatient Discharge Diagnosis:: "A-fib, s/p watchman flex placement" How have you been since you were released from the hospital?: Better (Spoke with spouse who states pt is still asleep. He voices everything has been going well and pt doing good since returning home. He changed drsg yest and voices surgical incison looks goo-no s/s infection.") Any questions or concerns?: No  Items Reviewed: Did you receive and understand the discharge instructions provided?: Yes Medications obtained and verified?: Yes (Medications Reviewed) Any new allergies since your discharge?: No Dietary orders reviewed?: Yes Type of Diet Ordered:: low salt/heart healthy Do you have support at home?: Yes People in Home: spouse Name of Support/Comfort Primary Source: [facility] and Equipment/Supplies: Were Home Health Services Ordered?: NA Any new equipment or medical supplies ordered?: NA  Functional Questionnaire: Do you need assistance with bathing/showering or dressing?: No Do you need assistance with meal preparation?: No Do you need assistance with eating?: No Do you have difficulty maintaining continence: No Do you need assistance with getting out of bed/getting out of a chair/moving?: No Do you have difficulty managing or taking your medications?: No  Follow up appointments reviewed: PCP Follow-up appointment confirmed?:  (Pt has appt on 02/26/23-scheduled prior to hospital admit. Advised spouse of need for PCP follow up within 1-2wks. He prefers to make appt when pt awake an able to agree on date/time-they will call the office later to move  appt up sooner) Specialist Hospital Follow-up appointment confirmed?: Yes Date of Specialist follow-up appointment?: 02/28/23 Follow-Up Specialty Provider:: Cardiology-02/28/23 Do you need transportation to your follow-up appointment?: No Do you understand care options if your condition(s) worsen?: Yes-patient verbalized understanding  SDOH Interventions Today    Flowsheet Row Most Recent Value  SDOH Interventions   Food Insecurity Interventions Intervention Not Indicated  Transportation Interventions Intervention Not Indicated      TOC Interventions Today    Flowsheet Row Most Recent Value  TOC Interventions   TOC Interventions Discussed/Reviewed TOC Interventions Discussed, Post discharge activity limitations per provider, Post op wound/incision care, S/S of infection      Interventions Today    Flowsheet Row Most Recent Value  Chronic Disease   Chronic disease during today's visit Atrial Fibrillation (AFib)  General Interventions   General Interventions Discussed/Reviewed General Interventions Discussed, Doctor Visits  Doctor Visits Discussed/Reviewed Doctor Visits Discussed, PCP, Specialist  PCP/Specialist Visits Compliance with follow-up visit  Education Interventions   Education Provided Provided Education  Provided Verbal Education On Nutrition, When to see the doctor, Medication, Other  Nutrition Interventions   Nutrition Discussed/Reviewed Nutrition Discussed, Adding fruits and vegetables, Decreasing salt  Pharmacy Interventions   Pharmacy Dicussed/Reviewed Pharmacy Topics Discussed, Medications and their functions  Safety Interventions   Safety Discussed/Reviewed Safety Discussed       Alessandra Grout [facility] Care Management Care Management Community Coordinator Direct Phone: [phone] Toll Free: [phone] Fax: [phone]

## 2023-02-12 ENCOUNTER — Other Ambulatory Visit: Payer: Self-pay | Admitting: Family Medicine

## 2023-02-17 ENCOUNTER — Other Ambulatory Visit: Payer: Self-pay | Admitting: Family Medicine

## 2023-02-17 ENCOUNTER — Telehealth: Payer: Self-pay

## 2023-02-17 NOTE — Telephone Encounter (Signed)
Ok to refer to podiatry but ok to put on my schedule next week for toenail removal if she can wait.

## 2023-02-17 NOTE — Telephone Encounter (Signed)
Please confirm if ok for referral. Last podiatry referral placed 2014.

## 2023-02-17 NOTE — Telephone Encounter (Signed)
   Reason for Referral Request:   ingrown toenail  Has patient been seen PCP for this complaint? Not sure. Patient stated that when she was here last, Dr. Milinda Cave did not have time to remove it. She is telling me that he is aware of it.  No,  please schedule patient for appointment for complaint.   Referral for which specialty: Podiatry   Preferred office/provider:

## 2023-02-18 ENCOUNTER — Other Ambulatory Visit: Payer: Self-pay

## 2023-02-18 NOTE — Telephone Encounter (Signed)
Pt already scheduled for 5/29 and states she will let Dr. Milinda Cave look at it first and go from there.

## 2023-02-20 NOTE — Patient Instructions (Signed)
   It was very nice to see you today!   PLEASE NOTE:   If you had any lab tests please let us know if you have not heard back within a few days. You may see your results on MyChart before we have a chance to review them but we will give you a call once they are reviewed by us. If we ordered any referrals today, please let us know if you have not heard from their office within the next 2 weeks. You should receive a letter via MyChart confirming if the referral was approved and their office contact information to schedule.  Please try these tips to maintain a healthy lifestyle:  Eat most of your calories during the day when you are active. Eliminate processed foods including packaged sweets (pies, cakes, cookies), reduce intake of potatoes, white bread, white pasta, and white rice. Look for whole grain options, oat flour or almond flour.  Each meal should contain half fruits/vegetables, one quarter protein, and one quarter carbs (no bigger than a computer mouse).  Cut down on sweet beverages. This includes juice, soda, and sweet tea. Also watch fruit intake, though this is a healthier sweet option, it still contains natural sugar! Limit to 3 servings daily.  Drink at least 1 glass of water with each meal and aim for at least 8 glasses per day  Exercise at least 150 minutes every week.   

## 2023-02-21 DIAGNOSIS — M48062 Spinal stenosis, lumbar region with neurogenic claudication: Secondary | ICD-10-CM | POA: Diagnosis not present

## 2023-02-25 ENCOUNTER — Telehealth: Payer: Self-pay | Admitting: Family Medicine

## 2023-02-25 ENCOUNTER — Other Ambulatory Visit: Payer: Self-pay | Admitting: Family Medicine

## 2023-02-25 DIAGNOSIS — Z79899 Other long term (current) drug therapy: Secondary | ICD-10-CM | POA: Diagnosis not present

## 2023-02-25 DIAGNOSIS — K5180 Other ulcerative colitis without complications: Secondary | ICD-10-CM | POA: Diagnosis not present

## 2023-02-25 LAB — CBC AND DIFFERENTIAL
HCT: 34 — AB (ref 36–46)
Hemoglobin: 10.6 — AB (ref 12.0–16.0)
Neutrophils Absolute: 4
Platelets: 134 10*3/uL — AB (ref 150–400)
WBC: 7

## 2023-02-25 LAB — BASIC METABOLIC PANEL
BUN: 21 (ref 4–21)
CO2: 17 (ref 13–22)
Chloride: 100 (ref 99–108)
Creatinine: 0.7 (ref 0.5–1.1)
Glucose: 410
Potassium: 3.9 mEq/L (ref 3.5–5.1)
Sodium: 136 — AB (ref 137–147)

## 2023-02-25 LAB — HEPATIC FUNCTION PANEL
ALT: 20 U/L (ref 7–35)
AST: 25 (ref 13–35)
Alkaline Phosphatase: 108 (ref 25–125)
Bilirubin, Total: 0.4

## 2023-02-25 LAB — COMPREHENSIVE METABOLIC PANEL
Albumin: 3.3 — AB (ref 3.5–5.0)
Calcium: 8.5 — AB (ref 8.7–10.7)
Globulin: 3.6
eGFR: 90

## 2023-02-25 LAB — CBC: RBC: 3.82 — AB (ref 3.87–5.11)

## 2023-02-25 NOTE — Telephone Encounter (Signed)
I see that [name] is on my schedule tomorrow for "RCI and toenail removal". When I said on 5/20 that I could do her toenail I did not mean that I could do it at the same time as her usual follow-up visit.  We will have to limit to only toenail removal tomorrow due to time limitations. We could set up a follow-up next week to recheck her toe (assuming I remove the nail tomorrow) and do RCI at that time.

## 2023-02-25 NOTE — Telephone Encounter (Signed)
Pt has upcoming appt tomorrow, 5/29. Will address refills during appt.

## 2023-02-25 NOTE — Telephone Encounter (Signed)
Tried calling patient, unable to LVM.   If pt returns call, please inform of message below.

## 2023-02-26 ENCOUNTER — Encounter: Payer: Self-pay | Admitting: Family Medicine

## 2023-02-26 ENCOUNTER — Ambulatory Visit: Payer: Medicare PPO | Admitting: Family Medicine

## 2023-02-26 VITALS — BP 137/79 | HR 60 | Temp 98.2°F | Wt 222.6 lb

## 2023-02-26 DIAGNOSIS — E1143 Type 2 diabetes mellitus with diabetic autonomic (poly)neuropathy: Secondary | ICD-10-CM | POA: Diagnosis not present

## 2023-02-26 DIAGNOSIS — L602 Onychogryphosis: Secondary | ICD-10-CM

## 2023-02-26 DIAGNOSIS — Z794 Long term (current) use of insulin: Secondary | ICD-10-CM | POA: Diagnosis not present

## 2023-02-26 DIAGNOSIS — R7401 Elevation of levels of liver transaminase levels: Secondary | ICD-10-CM

## 2023-02-26 LAB — POCT GLYCOSYLATED HEMOGLOBIN (HGB A1C)
HbA1c POC (<> result, manual entry): 7.6 % (ref 4.0–5.6)
HbA1c, POC (controlled diabetic range): 7.6 % — AB (ref 0.0–7.0)
HbA1c, POC (prediabetic range): 7.6 % — AB (ref 5.7–6.4)
Hemoglobin A1C: 7.6 % — AB (ref 4.0–5.6)

## 2023-02-26 NOTE — Progress Notes (Signed)
OFFICE VISIT  04/30/2023  CC:  Chief Complaint  Patient presents with   Mangement of Chronic conditions    Patient is a 77 y.o. female who presents for 3 mo f/u DM, HTN. At last visit: "diabetes with peripheral neuropathy. POC Hba1c today is 6.5%. Continue Trulicity, Invokana, and U-500 (100 U qAM and 100 U lunch, none at supper or bedtime). Will help her get Dexcom."  INTERIM HX: Doing well.  Tends to have some hypoglycemia in the middle the night. Currently taking 100 units of U-503 times a day. Evening glucoses higher.  Home blood pressures normal.  Thickened left great toenail.  No pain.  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 ? to get liver bx if she gets bariatric surgery? Mild portal hypertensive gastropathy on EGD 08/2019.   Dysrhythmia    Fibromyalgia    GAD (generalized anxiety disorder)    GERD    History of hiatal hernia    History of iron deficiency anemia 12/2018   Inadequate absorption secondary to chronic/long term PPI therapy + portal hypertensive gastroduodenopathy. No GIB found on EGD, colonosc, and givens. Iron infusions X multiple.   History of thrombocytopenia 12/2011   Hyperlipidemia    Intolerant of statins   HYPERTENSION    IBS (irritable bowel syndrome)    -D.  Good response to bentyl and imodium as of 06/2018 GI f/u.   IDDM (insulin dependent diabetes mellitus)    with DPN (managed by Dr. Elvera Lennox but then in 2018 pt preferred to have me manage for her convenience)   Limited mobility    Requires a walker for arthritic pain, widespread musculoskeletal pain, and neuropathic pain.   Morbid obesity (HCC)    As of 11/2018, pt considering sleeve gastectomy vs bipass as of eval by Dr. Alvino Blood considering as of 12/2019.   Nonalcoholic steatohepatitis    Viral Hep screens NEG.  CT 2015.   Transaminasemia.  U/S 07/2018 showed early changes of cirrhosis.   OSA (obstructive sleep apnea) 09/14/2015   sleep study 09/07/15: severe obstructive sleep apnea with an AHI of 72 and SaO2 low of 75%.>referred to sleep MD   Osteoarthritis    hips, shoulders, knees   PAF (paroxysmal atrial fibrillation) (HCC)    One documented episode (after getting EGD 2016).  Was on amiodarone x 3 mo.  Rate control with metoprolol + anticoag with xarelto.    PONV (postoperative nausea and vomiting)    Recurrent epistaxis    Granuloma in L nare cauterized by ENT 04/2020. Another cautery 06/2020   Small fiber neuropathy    Due to DM.  Symmetric hands and feet tingling/numbness.   Ulcerative colitis (HCC)    Remicade infusion Q 8 weeks: in clinical and endoscopic remission as of 12/2018 GI f/u.  06/11/19 rpt colonoscopy->cecal and ascending colon colitis.    Past Surgical History:  Procedure Laterality Date   ABDOMINAL HYSTERECTOMY  1980   Paps no longer indicated.   BACK SURGERY     BACTERIAL OVERGROWTH TEST N/A 07/13/2015   Procedure: BACTERIAL OVERGROWTH TEST;  Surgeon: Malissa Hippo, MD;  [facility];  Service: Endoscopy;  Laterality: N/A;  730     BILATERAL SALPINGOOPHORECTOMY  02/10/2001   BIOPSY  06/11/2019   Procedure: BIOPSY;  Surgeon: Malissa Hippo, MD;  [facility];  Service: Endoscopy;;  colon   BIOPSY  09/27/2019   Procedure: BIOPSY;  Surgeon: Malissa Hippo, MD;  [facility];  Service: Endoscopy;;  gastric duodenal   BIOPSY  09/15/2020   Procedure: BIOPSY;  Surgeon: Dolores Frame, MD;  [facility];  Service: Gastroenterology;;   BREAST REDUCTION SURGERY  1994   bilat   BREAST SURGERY     CARDIOVASCULAR STRESS TEST  07/2010   Lexiscan myoview: normal   CARPAL TUNNEL RELEASE Right 1996   CARPAL TUNNEL RELEASE Left 03/21/2003   CARPAL TUNNEL RELEASE Right 05/04/2013   Procedure: CARPAL TUNNEL RELEASE;  Surgeon: Wyn Forster., MD;  Location: [facility];  Service: Orthopedics;  Laterality: Right;   CARPAL TUNNEL RELEASE Left 09/21/2013   Procedure: LEFT CARPAL TUNNEL RELEASE;  Surgeon: Wyn Forster., MD;  Location: [facility];  Service: Orthopedics;  Laterality: Left;   CHOLECYSTECTOMY     COLONOSCOPY WITH PROPOFOL N/A 08/04/2015   Colitis in remission.  No polyps.  Procedure: COLONOSCOPY WITH PROPOFOL;  Surgeon: Malissa Hippo, MD;  [facility]: [facility];  Service: Endoscopy;  Laterality: N/A;  cecum time in  0820   time out  0827    total time 7 minutes   COLONOSCOPY WITH PROPOFOL N/A 06/11/2019   cecal and ascending colon colitis.  Procedure: COLONOSCOPY WITH PROPOFOL;  Surgeon: Malissa Hippo, MD;  [facility];  Service: Endoscopy;  Laterality: N/A;  730a   COLONOSCOPY WITH PROPOFOL N/A 09/15/2020   Procedure: COLONOSCOPY WITH PROPOFOL;  Surgeon: Dolores Frame, MD;  [facility];  Service: Gastroenterology;  Laterality: N/A;  1030   ESOPHAGEAL DILATION N/A 08/04/2015   Procedure: ESOPHAGEAL DILATION;  Surgeon: Malissa Hippo, MD;  [facility]: [facility];  Service: Endoscopy;  Laterality: N/ALauralee Evener, no mucousal disruption   ESOPHAGOGASTRODUODENOSCOPY  09/27/2019   Performed for IDA.  Esoph dilation was done but no stricture present.  Mild portal hypertensive gastropathy, o/w normal.  Duodenal bx NEG.  h pylori neg.   ESOPHAGOGASTRODUODENOSCOPY (EGD) WITH ESOPHAGEAL DILATION  12/02/2005   ESOPHAGOGASTRODUODENOSCOPY (EGD) WITH PROPOFOL N/A 08/04/2015   Procedure: ESOPHAGOGASTRODUODENOSCOPY (EGD) WITH PROPOFOL;  Surgeon: Malissa Hippo, MD;  [facility];  Service: Endoscopy;  Laterality: N/A;  procedure 1   ESOPHAGOGASTRODUODENOSCOPY (EGD) WITH PROPOFOL N/A 09/27/2019   Procedure: ESOPHAGOGASTRODUODENOSCOPY (EGD) WITH PROPOFOL;  Surgeon: Malissa Hippo, MD;  [facility];  Service: Endoscopy;  Laterality: N/A;  12:10    ESOPHAGOGASTRODUODENOSCOPY (EGD) WITH PROPOFOL N/A 10/31/2021   Procedure: ESOPHAGOGASTRODUODENOSCOPY (EGD) WITH PROPOFOL;  Surgeon: Malissa Hippo, MD;  [facility];  Service: Endoscopy;  Laterality: N/A;  930   FLEXIBLE SIGMOIDOSCOPY  01/17/2012   Procedure: FLEXIBLE SIGMOIDOSCOPY;  Surgeon: Malissa Hippo, MD;  [facility];  Service: Endoscopy;  Laterality: N/A;   GIVENS CAPSULE STUDY N/A 08/03/2019   Procedure: GIVENS CAPSULE STUDY (performed for IDA)->some food debris in stomach and small amount of blood.  Surgeon: Malissa Hippo, MD;  [facility];  Service: Endoscopy;  Laterality: N/A;  730AM   HEMILAMINOTOMY LUMBAR SPINE Bilateral 09/07/1999   L4-5   KNEE ARTHROSCOPY Right 01/1999; 10/2000   LEFT ATRIAL APPENDAGE OCCLUSION N/A 01/23/2023   Procedure: LEFT ATRIAL APPENDAGE OCCLUSION;  Surgeon: [name], MD;  Location: [facility];  Service: Cardiovascular;  Laterality: N/A;   LYSIS OF ADHESION  02/10/2001   MALONEY DILATION  09/27/2019   Procedure: Elease Hashimoto DILATION;  Surgeon: Malissa Hippo, MD;  [facility];  Service: Endoscopy;;   NASAL ENDOSCOPY WITH EPISTAXIS CONTROL Bilateral 01/25/2022   Procedure: NASAL ENDOSCOPY WITH EPISTAXIS CONTROL;  Surgeon: Osborn Coho, MD;  Location: [facility];  Service: ENT;  Laterality: Bilateral;   NASAL SEPTOPLASTY W/ TURBINOPLASTY Bilateral 01/25/2022   Procedure: NASAL SEPTOPLASTY WITH TURBINATE REDUCTION;  Surgeon: Osborn Coho, MD;  Location: [facility];  Service: ENT;  Laterality: Bilateral;   RECTOCELE REPAIR  1990; 09/12/2006   TARSAL TUNNEL RELEASE  2002   TEE WITHOUT CARDIOVERSION N/A 01/23/2023   Procedure: TRANSESOPHAGEAL ECHOCARDIOGRAM;  Surgeon: [name], MD;  Location: [facility] INVASIVE CV LAB;  Service: Cardiovascular;  Laterality: N/A;   TRANSTHORACIC ECHOCARDIOGRAM  08/04/2015   EF 60-65%, normal wall motion, mild LVH, mild LA dilation, grd I DD.   TUMOR EXCISION Left  03/21/2003   dorsal 1st web space (hand)   URETEROLYSIS Right 02/10/2001    Outpatient Medications Prior to Visit  Medication Sig Dispense Refill   Accu-Chek Softclix Lancets lancets USE TO CHECK BLOOD GLUCOSE UP TO 6 TIMES DAILY AS DIRECTED 200 each 5   acetaminophen (TYLENOL) 325 MG tablet Take 650 mg by mouth every 6 (six) hours as needed for moderate pain or headache.     blood glucose meter kit and supplies KIT Use up to six times daily as directed. DX. E11.9 1 each 0   canagliflozin (INVOKANA) 100 MG TABS tablet TAKE 1 TABLET BY MOUTH EVERY DAY BEFORE BREAKFAST 90 tablet 1   citalopram (CELEXA) 20 MG tablet TAKE 1 TABLET BY MOUTH EVERY DAY 90 tablet 1   dicyclomine (BENTYL) 10 MG capsule TAKE 1 CAPSULE (10 MG TOTAL) BY MOUTH EVERY 8 (EIGHT) HOURS AS NEEDED FOR SPASMS (ABDOMINAL PAIN). 270 capsule 1   diphenoxylate-atropine (LOMOTIL) 2.5-0.025 MG tablet Take 1 tablet by mouth 4 (four) times daily as needed for diarrhea or loose stools. 60 tablet 2   Dulaglutide (TRULICITY) 1.5 MG/0.5ML SOPN INJECT 1.5MG  ONCE WEEKLY 18 mL 3   esomeprazole (NEXIUM) 20 MG capsule TAKE 1 CAPSULE BY MOUTH DAILY BEFORE BREAKFAST 90 capsule 3   furosemide (LASIX) 40 MG tablet Take 1 tablet (40 mg total) by mouth daily. 90 tablet 1   gabapentin (NEURONTIN) 600 MG tablet TAKE 1 AND 1/2 TABLETS BY MOUTH TWICE A DAY (Patient taking differently: Take 900 mg by mouth 2 (two) times daily. TAKE 1 AND 1/2 TABLETS BY MOUTH TWICE A DAY) 270 tablet 1   INFLIXIMAB IV Inject into the vein every 8 (eight) weeks. Remicaid 5mg /kg Infusion every 8 weeks [facility])     insulin regular human CONCENTRATED (HUMULIN R U-500 KWIKPEN) 500 UNIT/ML KwikPen Inject 100 Units into the skin 3 (three) times daily with meals. Pt stated 100 units for breakfast lunch and dinner 48 mL 0   lactase (LACTAID) 3000 UNITS tablet Take 3,000 Units by mouth as needed (when eating foods containing dairy).      meclizine (ANTIVERT) 25 MG  tablet Take 1 tablet (25 mg total) by mouth 3 (three) times daily as needed for dizziness or nausea. 90 tablet 0   metoprolol succinate (TOPROL-XL) 100 MG 24 hr tablet TAKE 1 TABLET BY MOUTH 2 TIMES DAILY. TAKE WITH OR IMMEDIATELY FOLLOWING A MEAL 180 tablet 1   potassium chloride (KLOR-CON M10) 10 MEQ tablet TAKE 1 TABLET BY MOUTH EVERY DAY 30 tablet 0   ACCU-CHEK GUIDE test strip USE TO CHECK BLOOD GLUCOSE  UP TO 6 TIMES DAILY AS DIRECTED 100 strip 3   Insulin Pen Needle (B-D UF III MINI PEN NEEDLES) 31G X 5 MM MISC USE TO INJECT INSULINS EQUAL TO 6 TIMES DAILY. 600 each 5   rivaroxaban (XARELTO) 20 MG TABS tablet Take 1 tablet (20 mg total) by mouth daily with supper. 30 tablet 5   ferric carboxymaltose (INJECTAFER) 750 MG/15ML SOLN injection Inject 15 mLs (750 mg total) into the vein as directed for 1 dose. Repeat in 7 days 15 mL 1   methocarbamol (ROBAXIN) 500 MG tablet Take 1 tablet (500 mg total) by mouth 3 (three) times daily. May cause drowsiness (Patient not taking: Reported on 01/15/2023) 21 tablet 0   Pediatric Multivitamins-Iron (FLINTSTONES COMPLETE) 18 MG CHEW Chew 1 tablet by mouth 2 (two) times daily. (Patient not taking: Reported on 01/15/2023)     potassium chloride (KLOR-CON) 10 MEQ tablet Take 10 mEq by mouth daily.     No facility-administered medications prior to visit.    Allergies  Allergen Reactions   Omeprazole Anaphylaxis and Swelling    SWELLING OF TONGUE AND THROAT   Actos [Pioglitazone] Swelling and Other (See Comments)    Weight gain, tongue swelling    Benzocaine-Menthol Swelling    SWELLING OF MOUTH   Colesevelam Other (See Comments)    GI UPSET   Flagyl [Metronidazole Hcl] Other (See Comments)    DIAPHORESIS   Metformin And Related Diarrhea   Shrimp [Shellfish Allergy] Itching    OF THROAT AND EARS, if consumed raw   Statins Palpitations   Allevyn Adhesive [Wound Dressings]     Other reaction(s): Unknown   Desipramine Hcl Itching, Nausea Only and Other  (See Comments)    "swimmy" headed, ears itched    Hydromorphone Itching   Jardiance [Empagliflozin] Other (See Comments)    weakness   Lactose Intolerance (Gi) Diarrhea    Gas, bloating   Adhesive [Tape] Other (See Comments)    SKIN IRRITATION AND BRUISING   Nisoldipine Itching   Percocet [Oxycodone-Acetaminophen] Itching    Review of Systems As per HPI  PE:    03/27/2023   10:51 AM 02/28/2023   11:12 AM 02/26/2023    1:19 PM  Vitals with BMI  Height  5\' 2"    Weight  225 lbs   BMI  41.14   Systolic 149 138 604  Diastolic 63 60 79  Pulse  67      Physical Exam  Gen: Alert, well appearing.  Patient is oriented to person, place, time, and situation. L great toenail thickened and deformed.  No soft tissue erythema or swelling.  LABS:  Last CBC Lab Results  Component Value Date   WBC 7.0 02/25/2023   HGB 10.6 (A) 02/25/2023   HCT 34 (A) 02/25/2023   MCV 96 01/08/2023   MCH 31.5 01/08/2023   RDW 12.1 01/08/2023   PLT 134 (A) 02/25/2023   Lab Results  Component Value Date   IRON 129 08/28/2022   TIBC 243 (L) 08/28/2022   FERRITIN 333 (H) 08/28/2022   Last metabolic panel Lab Results  Component Value Date   GLUCOSE 196 (H) 01/08/2023   NA 136 (A) 02/25/2023   K 3.9 02/25/2023   CL 100 02/25/2023   CO2 17 02/25/2023   BUN 21 02/25/2023   CREATININE 0.7 02/25/2023   EGFR 90 02/25/2023   CALCIUM 8.5 (A) 02/25/2023   PROT 6.7 11/28/2022   ALBUMIN 3.3 (A) 02/25/2023   LABGLOB 3.4 08/17/2015  AGRATIO 1.0 08/17/2015   BILITOT 0.7 11/28/2022   ALKPHOS 108 02/25/2023   AST 25 02/25/2023   ALT 20 02/25/2023   ANIONGAP 5 10/10/2022      IMPRESSION AND PLAN:  #1 diabetes with peripheral neuropathy. POC Hba1c today is 7.6%. Continue Trulicity1.5mg  SQ q week, Invokana, and U-500 (100 U qAM and 100 U lunch, none at supper or bedtime)  #2 hypertension, well-controlled on Toprol-XL 100 mg a day.  3. Onychogryphosis, L great toenail. Nipped today.  An  After Visit Summary was printed and given to the patient.  FOLLOW UP: Return in about 3 months (around 05/29/2023) for routine chronic illness f/u.  Signed:  Santiago Bumpers, MD           04/30/2023

## 2023-02-27 NOTE — Progress Notes (Signed)
[facility]   MULTIDISCIPLINARY HEART VALVE CLINIC                                     Cardiology Office Note:    Date:  02/28/2023   ID:  [name], DOB 02/17/1946, MRN [id number]  PCP:  Jeoffrey Massed, MD  [facility] Cardiologist:  Olga Millers, MD  [facility] Electrophysiologist:  None   Referring MD: Jeoffrey Massed, MD   1 month follow up s/p LAAO with Watchman  History of Present Illness:    [name] is a 77 y.o. female with a hx of recurrent epistaxis and PAF s/p LAAO with Watchman (01/22/23) who presents to clinic for follow up.   [name] follows with Dr. Jens Som for her cardiology care and reported daily nosebleeds while taking Xarelto for her atrial fibrillation. She underwent successful implantation of a 24 mm WATCHMAN FLX Pro left atrial appendage occlusive device by Dr. Excell Seltzer on 01/22/23. She was resumed on Xarelto to continue for 45 days. At that point she will stop and start Plavix monotherapy for 6 months duration. CT imaging will be performed at 60 days to ensure proper device seal and no device thrombus.   Today the patient presents to clinic for follow up. Here with husband. Had two nosebleeds since her watchman. Looking forward to stopping blood thinner. No CP or SOB. No LE edema, orthopnea or PND. No dizziness or syncope. No blood in stool or urine. No palpitations.     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 ? to get liver bx if she gets bariatric surgery? Mild portal hypertensive gastropathy on EGD 08/2019.   Dysrhythmia    Fibromyalgia    GAD (generalized anxiety disorder)    GERD    History of hiatal hernia    History of iron deficiency anemia 12/2018   Inadequate absorption secondary to chronic/long term PPI therapy + portal hypertensive gastroduodenopathy. No  GIB found on EGD, colonosc, and givens. Iron infusions X multiple.   History of thrombocytopenia 12/2011   Hyperlipidemia    Intolerant of statins   HYPERTENSION    IBS (irritable bowel syndrome)    -D.  Good response to bentyl and imodium as of 06/2018 GI f/u.   IDDM (insulin dependent diabetes mellitus)    with DPN (managed by Dr. Elvera Lennox but then in 2018 pt preferred to have me manage for her convenience)   Limited mobility    Requires a walker for arthritic pain, widespread musculoskeletal pain, and neuropathic pain.   Morbid obesity (HCC)    As of 11/2018, pt considering sleeve gastectomy vs bipass as of eval by Dr. Alvino Blood considering as of 12/2019.   Nonalcoholic steatohepatitis    Viral Hep screens NEG.  CT 2015.  Transaminasemia.  U/S 07/2018 showed early changes of cirrhosis.   OSA (obstructive sleep apnea) 09/14/2015   sleep study 09/07/15: severe obstructive sleep apnea with an AHI of 72 and SaO2 low of 75%.>referred to sleep MD   Osteoarthritis    hips, shoulders, knees   PAF (paroxysmal atrial fibrillation) (HCC)    One documented episode (after getting EGD 2016).  Was on amiodarone x 3 mo.  Rate control with metoprolol + anticoag with xarelto.  PONV (postoperative nausea and vomiting)    Recurrent epistaxis    Granuloma in L nare cauterized by ENT 04/2020. Another cautery 06/2020   Small fiber neuropathy    Due to DM.  Symmetric hands and feet tingling/numbness.   Ulcerative colitis (HCC)    Remicade infusion Q 8 weeks: in clinical and endoscopic remission as of 12/2018 GI f/u.  06/11/19 rpt colonoscopy->cecal and ascending colon colitis.    Past Surgical History:  Procedure Laterality Date   ABDOMINAL HYSTERECTOMY  1980   Paps no longer indicated.   BACK SURGERY     BACTERIAL OVERGROWTH TEST N/A 07/13/2015   Procedure: BACTERIAL OVERGROWTH TEST;  Surgeon: Malissa Hippo, MD;  [facility];  Service: Endoscopy;  Laterality: N/A;  730     BILATERAL  SALPINGOOPHORECTOMY  02/10/2001   BIOPSY  06/11/2019   Procedure: BIOPSY;  Surgeon: Malissa Hippo, MD;  [facility];  Service: Endoscopy;;  colon   BIOPSY  09/27/2019   Procedure: BIOPSY;  Surgeon: Malissa Hippo, MD;  [facility];  Service: Endoscopy;;  gastric duodenal   BIOPSY  09/15/2020   Procedure: BIOPSY;  Surgeon: Dolores Frame, MD;  [facility];  Service: Gastroenterology;;   BREAST REDUCTION SURGERY  1994   bilat   BREAST SURGERY     CARDIOVASCULAR STRESS TEST  07/2010   Lexiscan myoview: normal   CARPAL TUNNEL RELEASE Right 1996   CARPAL TUNNEL RELEASE Left 03/21/2003   CARPAL TUNNEL RELEASE Right 05/04/2013   Procedure: CARPAL TUNNEL RELEASE;  Surgeon: Wyn Forster., MD;  Location: [facility];  Service: Orthopedics;  Laterality: Right;   CARPAL TUNNEL RELEASE Left 09/21/2013   Procedure: LEFT CARPAL TUNNEL RELEASE;  Surgeon: Wyn Forster., MD;  Location: [facility];  Service: Orthopedics;  Laterality: Left;   CHOLECYSTECTOMY     COLONOSCOPY WITH PROPOFOL N/A 08/04/2015   Colitis in remission.  No polyps.  Procedure: COLONOSCOPY WITH PROPOFOL;  Surgeon: Malissa Hippo, MD;  [facility]: [facility];  Service: Endoscopy;  Laterality: N/A;  cecum time in  0820   time out  0827    total time 7 minutes   COLONOSCOPY WITH PROPOFOL N/A 06/11/2019   cecal and ascending colon colitis.  Procedure: COLONOSCOPY WITH PROPOFOL;  Surgeon: Malissa Hippo, MD;  [facility];  Service: Endoscopy;  Laterality: N/A;  730a   COLONOSCOPY WITH PROPOFOL N/A 09/15/2020   Procedure: COLONOSCOPY WITH PROPOFOL;  Surgeon: Dolores Frame, MD;  [facility];  Service: Gastroenterology;  Laterality: N/A;  1030   ESOPHAGEAL DILATION N/A 08/04/2015   Procedure: ESOPHAGEAL DILATION;  Surgeon: Malissa Hippo, MD;  [facility]: [facility];  Service: Endoscopy;  Laterality: N/ALauralee Evener, no  mucousal disruption   ESOPHAGOGASTRODUODENOSCOPY  09/27/2019   Performed for IDA.  Esoph dilation was done but no stricture present.  Mild portal hypertensive gastropathy, o/w normal.  Duodenal bx NEG.  h pylori neg.   ESOPHAGOGASTRODUODENOSCOPY (EGD) WITH ESOPHAGEAL DILATION  12/02/2005   ESOPHAGOGASTRODUODENOSCOPY (EGD) WITH PROPOFOL N/A 08/04/2015   Procedure: ESOPHAGOGASTRODUODENOSCOPY (EGD) WITH PROPOFOL;  Surgeon: Malissa Hippo, MD;  [facility];  Service: Endoscopy;  Laterality: N/A;  procedure 1   ESOPHAGOGASTRODUODENOSCOPY (EGD) WITH PROPOFOL N/A 09/27/2019   Procedure: ESOPHAGOGASTRODUODENOSCOPY (EGD) WITH PROPOFOL;  Surgeon: Malissa Hippo, MD;  [facility];  Service: Endoscopy;  Laterality: N/A;  12:10   ESOPHAGOGASTRODUODENOSCOPY (  EGD) WITH PROPOFOL N/A 10/31/2021   Procedure: ESOPHAGOGASTRODUODENOSCOPY (EGD) WITH PROPOFOL;  Surgeon: Malissa Hippo, MD;  [facility];  Service: Endoscopy;  Laterality: N/A;  930   FLEXIBLE SIGMOIDOSCOPY  01/17/2012   Procedure: FLEXIBLE SIGMOIDOSCOPY;  Surgeon: Malissa Hippo, MD;  [facility];  Service: Endoscopy;  Laterality: N/A;   GIVENS CAPSULE STUDY N/A 08/03/2019   Procedure: GIVENS CAPSULE STUDY (performed for IDA)->some food debris in stomach and small amount of blood.  Surgeon: Malissa Hippo, MD;  [facility];  Service: Endoscopy;  Laterality: N/A;  730AM   HEMILAMINOTOMY LUMBAR SPINE Bilateral 09/07/1999   L4-5   KNEE ARTHROSCOPY Right 01/1999; 10/2000   LEFT ATRIAL APPENDAGE OCCLUSION N/A 01/23/2023   Procedure: LEFT ATRIAL APPENDAGE OCCLUSION;  Surgeon: [name], MD;  Location: [facility] INVASIVE CV LAB;  Service: Cardiovascular;  Laterality: N/A;   LYSIS OF ADHESION  02/10/2001   MALONEY DILATION  09/27/2019   Procedure: Elease Hashimoto DILATION;  Surgeon: Malissa Hippo, MD;  [facility];  Service: Endoscopy;;   NASAL ENDOSCOPY WITH EPISTAXIS CONTROL Bilateral 01/25/2022    Procedure: NASAL ENDOSCOPY WITH EPISTAXIS CONTROL;  Surgeon: Osborn Coho, MD;  [facility] OR;  Service: ENT;  Laterality: Bilateral;   NASAL SEPTOPLASTY W/ TURBINOPLASTY Bilateral 01/25/2022   Procedure: NASAL SEPTOPLASTY WITH TURBINATE REDUCTION;  Surgeon: Osborn Coho, MD;  Location: [facility] OR;  Service: ENT;  Laterality: Bilateral;   RECTOCELE REPAIR  1990; 09/12/2006   TARSAL TUNNEL RELEASE  2002   TEE WITHOUT CARDIOVERSION N/A 01/23/2023   Procedure: TRANSESOPHAGEAL ECHOCARDIOGRAM;  Surgeon: [name], MD;  Location: [facility] INVASIVE CV LAB;  Service: Cardiovascular;  Laterality: N/A;   TRANSTHORACIC ECHOCARDIOGRAM  08/04/2015   EF 60-65%, normal wall motion, mild LVH, mild LA dilation, grd I DD.   TUMOR EXCISION Left 03/21/2003   dorsal 1st web space (hand)   URETEROLYSIS Right 02/10/2001    Current Medications: Current Meds  Medication Sig   ACCU-CHEK GUIDE test strip USE TO CHECK BLOOD GLUCOSE UP TO 6 TIMES DAILY AS DIRECTED   Accu-Chek Softclix Lancets lancets USE TO CHECK BLOOD GLUCOSE UP TO 6 TIMES DAILY AS DIRECTED   acetaminophen (TYLENOL) 325 MG tablet Take 650 mg by mouth every 6 (six) hours as needed for moderate pain or headache.   amoxicillin (AMOXIL) 500 MG tablet Take 4 tablets (2,000 mg total) by mouth as directed. 1 HOUR PRIOR TO DENTAL APPOINTMENTS   blood glucose meter kit and supplies KIT Use up to six times daily as directed. DX. E11.9   canagliflozin (INVOKANA) 100 MG TABS tablet TAKE 1 TABLET BY MOUTH EVERY DAY BEFORE BREAKFAST   citalopram (CELEXA) 20 MG tablet TAKE 1 TABLET BY MOUTH EVERY DAY   clopidogrel (PLAVIX) 75 MG tablet Take 1 tablet (75 mg total) by mouth daily. START ON 6/3   dicyclomine (BENTYL) 10 MG capsule TAKE 1 CAPSULE (10 MG TOTAL) BY MOUTH EVERY 8 (EIGHT) HOURS AS NEEDED FOR SPASMS (ABDOMINAL PAIN).   diphenoxylate-atropine (LOMOTIL) 2.5-0.025 MG tablet Take 1 tablet by mouth 4 (four) times daily as needed for diarrhea or loose  stools.   Dulaglutide (TRULICITY) 1.5 MG/0.5ML SOPN INJECT 1.5MG  ONCE WEEKLY   esomeprazole (NEXIUM) 20 MG capsule TAKE 1 CAPSULE BY MOUTH DAILY BEFORE BREAKFAST   ferric carboxymaltose (INJECTAFER) 750 MG/15ML SOLN injection Inject 15 mLs (750 mg total) into the vein as directed for 1 dose. Repeat in 7 days   furosemide (LASIX) 40 MG tablet  Take 1 tablet (40 mg total) by mouth daily.   gabapentin (NEURONTIN) 600 MG tablet TAKE 1 AND 1/2 TABLETS BY MOUTH TWICE A DAY (Patient taking differently: Take 900 mg by mouth 2 (two) times daily. TAKE 1 AND 1/2 TABLETS BY MOUTH TWICE A DAY)   INFLIXIMAB IV Inject into the vein every 8 (eight) weeks. Remicaid 5mg /kg Infusion every 8 weeks [facility])   Insulin Pen Needle (B-D UF III MINI PEN NEEDLES) 31G X 5 MM MISC USE TO INJECT INSULINS EQUAL TO 6 TIMES DAILY.   insulin regular human CONCENTRATED (HUMULIN R U-500 KWIKPEN) 500 UNIT/ML KwikPen Inject 100 Units into the skin 3 (three) times daily with meals. Pt stated 100 units for breakfast lunch and dinner   lactase (LACTAID) 3000 UNITS tablet Take 3,000 Units by mouth as needed (when eating foods containing dairy).    meclizine (ANTIVERT) 25 MG tablet Take 1 tablet (25 mg total) by mouth 3 (three) times daily as needed for dizziness or nausea.   metoprolol succinate (TOPROL-XL) 100 MG 24 hr tablet TAKE 1 TABLET BY MOUTH 2 TIMES DAILY. TAKE WITH OR IMMEDIATELY FOLLOWING A MEAL   potassium chloride (KLOR-CON M10) 10 MEQ tablet TAKE 1 TABLET BY MOUTH EVERY DAY   [DISCONTINUED] potassium chloride (KLOR-CON) 10 MEQ tablet Take 10 mEq by mouth daily.   [DISCONTINUED] rivaroxaban (XARELTO) 20 MG TABS tablet Take 1 tablet (20 mg total) by mouth daily with supper.     Allergies:   Omeprazole, Actos [pioglitazone], Benzocaine-menthol, Colesevelam, Flagyl [metronidazole hcl], Metformin and related, Shrimp [shellfish allergy], Statins, Allevyn adhesive [wound dressings], Desipramine hcl, Hydromorphone,  Jardiance [empagliflozin], Lactose intolerance (gi), Adhesive [tape], Nisoldipine, and Percocet [oxycodone-acetaminophen]   Social History   Socioeconomic History   Marital status: Married    Spouse name: Not on file   Number of children: Not on file   Years of education: Not on file   Highest education level: Not on file  Occupational History   Occupation: Retired  Tobacco Use   Smoking status: Never   Smokeless tobacco: Never  Vaping Use   Vaping Use: Never used  Substance and Sexual Activity   Alcohol use: Not Currently    Comment: ocassionally   Drug use: No   Sexual activity: Not Currently    Partners: Male    Birth control/protection: Surgical    Comment: hysterectomy  Other Topics Concern   Not on file  Social History Narrative   She lives with husband in [place] home.  They have one grown son and 2 grandchildren.   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   Highest of level education:  Some college.   Never smoker.   Alcohol: rare.   Social Determinants of Health   Financial Resource Strain: Low Risk  (01/15/2023)   Overall Financial Resource Strain (CARDIA)    Difficulty of Paying Living Expenses: Not hard at all  Food Insecurity: No Food Insecurity (01/27/2023)   Hunger Vital Sign    Worried About Running Out of Food in the Last Year: Never true    Ran Out of Food in the Last Year: Never true  Transportation Needs: No Transportation Needs (01/27/2023)   PRAPARE - Administrator, Civil Service (Medical): No    Lack of Transportation (Non-Medical): No  Physical Activity: Inactive (01/15/2023)   Exercise Vital Sign    Days of Exercise per Week: 0 days    Minutes of Exercise per Session: 0 min  Stress: No Stress Concern  Present (01/15/2023)   [employer] of Occupational Health - Occupational Stress Questionnaire    Feeling of Stress : Not at all  Social Connections: Moderately Isolated (01/15/2023)   Social Connection and Isolation Panel [NHANES]     Frequency of Communication with Friends and Family: Three times a week    Frequency of Social Gatherings with Friends and Family: Once a week    Attends Religious Services: Never    Database administrator or Organizations: No    Attends Engineer, structural: Not on file    Marital Status: Married     Family History: The patient's family history includes Alcohol abuse in an other family member; Asthma in her maternal grandfather; Colon cancer in her paternal grandfather; Diabetes in her mother and son; Emphysema in her maternal grandfather; Heart attack in her father; Heart disease in her father; Hypertension in her mother and son; Lung disease in her father; Stomach cancer in her paternal grandmother. There is no history of Neuropathy.  ROS:   Please see the history of present illness.    All other systems reviewed and are negative.  EKGs/Labs/Other Studies Reviewed:    Cardiac Studies & Procedures       ECHOCARDIOGRAM  ECHOCARDIOGRAM COMPLETE 08/04/2015  Narrative *[facility]* [address] [place], [place] [address] [phone]  ------------------------------------------------------------------- Transthoracic Echocardiography  Patient:    Dynver, Cordwell MR #:       [id number] Study Date: 08/04/2015 Gender:     F Age:        63 Height:     157.5 cm Weight:     95.7 kg BSA:        2.1 m^2 Pt. Status: Room:       IC08  ADMITTING    Tawni Levy REFERRING    Jodelle Gross ATTENDING    Erick Blinks [id number] SONOGRAPHER  Jeryl Columbia PERFORMING   [facility], Jeani Hawking  cc:  ------------------------------------------------------------------- LV EF: 60% -   65%  ------------------------------------------------------------------- Indications:      Atrial fibrillation - 427.31.  ------------------------------------------------------------------- History:   PMH:  Palpitations.  Atrial fibrillation.   Risk factors: Hypertension. Diabetes mellitus. Dyslipidemia.  ------------------------------------------------------------------- Study Conclusions  - Left ventricle: The cavity size was normal. Wall thickness was increased in a pattern of moderate LVH. Systolic function was normal. The estimated ejection fraction was in the range of 60% to 65%. Wall motion was normal; there were no regional wall motion abnormalities. Doppler parameters are consistent with abnormal left ventricular relaxation (grade 1 diastolic dysfunction). - Aortic valve: Moderately calcified annulus. Trileaflet; mildly thickened leaflets. Valve area (VTI): 2.21 cm^2. Valve area (Vmax): 2.1 cm^2. - Mitral valve: Mildly calcified annulus. Mildly thickened leaflets . - Left atrium: The atrium was mildly dilated. - Technically adequate study.  Transthoracic echocardiography.  M-mode, complete 2D, spectral Doppler, and color Doppler.  Birthdate:  Patient birthdate: July 14, 1946.  Age:  Patient is 77 yr old.  Sex:  Gender: female. BMI: 38.6 kg/m^2.  Blood pressure:     128/64  Patient status: Inpatient.  Study date:  Study date: 08/04/2015. Study time: 02:56 PM.  Location:  Bedside.  -------------------------------------------------------------------  ------------------------------------------------------------------- Left ventricle:  The cavity size was normal. Wall thickness was increased in a pattern of moderate LVH. Systolic function was normal. The estimated ejection fraction was in the range of 60% to 65%. Wall motion was normal; there were no regional wall motion  abnormalities. Doppler parameters are consistent with abnormal left ventricular relaxation (grade 1 diastolic dysfunction).  ------------------------------------------------------------------- Aortic valve:   Moderately calcified annulus. Trileaflet; mildly thickened leaflets.  Doppler:   There was no stenosis.   There was no significant  regurgitation.    VTI ratio of LVOT to aortic valve: 0.58. Valve area (VTI): 2.21 cm^2. Indexed valve area (VTI): 1.05 cm^2/m^2. Peak velocity ratio of LVOT to aortic valve: 0.55. Valve area (Vmax): 2.1 cm^2. Indexed valve area (Vmax): 1 cm^2/m^2. Mean gradient (S): 6 mm Hg. Peak gradient (S): 11 mm Hg.  ------------------------------------------------------------------- Aorta:  Aortic root: The aortic root was normal in size.  ------------------------------------------------------------------- Mitral valve:   Mildly calcified annulus. Mildly thickened leaflets .  Doppler:   There was no evidence for stenosis.   There was no significant regurgitation.    Peak gradient (D): 3 mm Hg.  ------------------------------------------------------------------- Left atrium:  The atrium was mildly dilated.  ------------------------------------------------------------------- Atrial septum:  Poorly visualized.  ------------------------------------------------------------------- Right ventricle:  The cavity size was normal. Systolic function was normal.  ------------------------------------------------------------------- Pulmonic valve:   Not well visualized.  Doppler:   There was no evidence for stenosis.   There was no significant regurgitation.  ------------------------------------------------------------------- Tricuspid valve:   Normal thickness leaflets.  Doppler:   There was no evidence for stenosis.   There was no significant regurgitation.  ------------------------------------------------------------------- Pulmonary artery:    Systolic pressure could not be accurately estimated.   Inadequate TR jet.  ------------------------------------------------------------------- Right atrium:  The atrium was normal in size.  ------------------------------------------------------------------- Pericardium:  There was no pericardial  effusion.  ------------------------------------------------------------------- Systemic veins: Inferior vena cava: The vessel was normal in size. The respirophasic diameter changes were in the normal range (>= 50%), consistent with normal central venous pressure.  ------------------------------------------------------------------- Measurements  Left ventricle                           Value           Reference LV ID, ED, PLAX chordal          (L)     41.9   mm       43 - 52 LV ID, ES, PLAX chordal                  28.7   mm       23 - 38 LV fx shortening, PLAX chordal           32     %        >=29 LV PW thickness, ED                      14.6   mm       --------- IVS/LV PW ratio, ED                      0.94            <=1.3  Ventricular septum                       Value           Reference IVS thickness, ED                        13.7   mm       ---------  LVOT  Value           Reference LVOT ID, S                               22     mm       --------- LVOT area                                3.8    cm^2     --------- LVOT peak velocity, S                    91.32  cm/s     --------- LVOT VTI, S                              23.79  cm       ---------  Aortic valve                             Value           Reference Aortic valve peak velocity, S            165.6  cm/s     --------- Aortic valve mean velocity, S            114.06 cm/s     --------- Aortic valve VTI, S                      40.96  cm       --------- Aortic mean gradient, S                  6      mm Hg    --------- Aortic peak gradient, S                  11     mm Hg    --------- VTI ratio, LVOT/AV                       0.58            --------- Aortic valve area, VTI                   2.21   cm^2     --------- Aortic valve area/bsa, VTI               1.05   cm^2/m^2 --------- Velocity ratio, peak, LVOT/AV            0.55            --------- Aortic valve area, peak velocity          2.1    cm^2     --------- Aortic valve area/bsa, peak              1      cm^2/m^2 --------- velocity  Aorta                                    Value           Reference Aortic root ID, ED  33     mm       ---------  Left atrium                              Value           Reference LA ID, A-P, ES                           38     mm       --------- LA volume/bsa, ES, 1-p A4C               29.9   ml/m^2   ---------  Mitral valve                             Value           Reference Mitral E-wave peak velocity              88.8   cm/s     --------- Mitral A-wave peak velocity              116    cm/s     --------- Mitral deceleration time                 229    ms       150 - 230 Mitral peak gradient, D                  3      mm Hg    --------- Mitral E/A ratio, peak                   0.8             ---------  Right ventricle                          Value           Reference TAPSE                                    23     mm       ---------  Legend: (L)  and  (H)  mark values outside specified reference range.  ------------------------------------------------------------------- Prepared and Electronically Authenticated by  Patrick Jupiter, M.D. 2016-11-04T15:30:18   TEE  ECHO TEE 01/23/2023  Narrative TRANSESOPHOGEAL ECHO REPORT    Patient Name:   HAZELINE BACHRACH Date of Exam: 01/23/2023 Medical Rec #:  [id number]     Height:       62.0 in Accession #:    [id number]    Weight:       226.0 lb Date of Birth:  Jan 20, 1946    BSA:          2.014 m Patient Age:    76 years      BP:           175/97 mmHg Patient Gender: F             HR:           68 bpm. Exam Location:  Inpatient  Procedure: Transesophageal Echo, 3D Echo, Color Doppler and Cardiac Doppler  Indications:     I48.1 Persistent atrial fibrillation  History:         Patient has prior history of Echocardiogram examinations, most recent 08/04/2015. Arrythmias:Atrial Fibrillation;  Risk Factors:Hypertension, Sleep Apnea, Diabetes and Dyslipidemia.  Sonographer:     Irving Burton Senior RDCS Referring Phys:  [phone] MICHAEL COOPER Diagnosing Phys: Thurmon Fair MD   Sonographer Comments: 24mm Watchman Device Implanted   PROCEDURE: After discussion of the risks and benefits of a TEE, an informed consent was obtained from the patient. The transesophogeal probe was passed without difficulty through the esophogus of the patient. Sedation performed by different physician. The patient was monitored while under deep sedation. The patient's vital signs; including heart rate, blood pressure, and oxygen saturation; remained stable throughout the procedure. The patient developed no complications during the procedure.  TEE was used for baseline evaluation and device selection, to guide transseptal puncture and device deployment and to assess the final result and potential complications.  IMPRESSIONS   1. After the procedure, there is a well-seated 24 mm Watchman FLX atrial appendage occluder device with adequate compression and no evidence of peri-device leak. 2. At the end of the procedure there is a very small iatrogenic atrial septal defect with left-to-right shunting. 3. Left ventricular ejection fraction, by estimation, is 60 to 65%. The left ventricle has normal function. The left ventricle has no regional wall motion abnormalities. 4. Right ventricular systolic function is normal. The right ventricular size is normal. 5. No left atrial/left atrial appendage thrombus was detected. 6. The mitral valve is normal in structure. Trivial mitral valve regurgitation. No evidence of mitral stenosis. 7. The aortic valve is tricuspid. Aortic valve regurgitation is not visualized. No aortic stenosis is present. 8. The inferior vena cava is normal in size with greater than 50% respiratory variability, suggesting right atrial pressure of 3 mmHg.  FINDINGS Left Ventricle: Left ventricular  ejection fraction, by estimation, is 60 to 65%. The left ventricle has normal function. The left ventricle has no regional wall motion abnormalities. The left ventricular internal cavity size was normal in size. There is no left ventricular hypertrophy.  Right Ventricle: The right ventricular size is normal. No increase in right ventricular wall thickness. Right ventricular systolic function is normal.  Left Atrium: Left atrial size was normal in size. No left atrial/left atrial appendage thrombus was detected.  Right Atrium: Right atrial size was normal in size.  Pericardium: There is no evidence of pericardial effusion.  Mitral Valve: The mitral valve is normal in structure. Trivial mitral valve regurgitation. No evidence of mitral valve stenosis.  Tricuspid Valve: The tricuspid valve is normal in structure. Tricuspid valve regurgitation is not demonstrated. No evidence of tricuspid stenosis.  Aortic Valve: The aortic valve is tricuspid. Aortic valve regurgitation is not visualized. No aortic stenosis is present.  Pulmonic Valve: The pulmonic valve was normal in structure. Pulmonic valve regurgitation is trivial. No evidence of pulmonic stenosis.  Aorta: The aortic root is normal in size and structure.  Venous: The inferior vena cava is normal in size with greater than 50% respiratory variability, suggesting right atrial pressure of 3 mmHg.  IAS/Shunts: No atrial level shunt detected by color flow Doppler.  Additional Comments: Spectral Doppler performed.  Thurmon Fair MD Electronically signed by Thurmon Fair MD Signature Date/Time: 01/23/2023/1:45:57 PM    Final            EKG:  EKG is NOT ordered today.    Recent Labs: 10/09/2022: Magnesium 2.2; TSH 0.618 02/25/2023: ALT 20; BUN 21; Creatinine 0.7; Hemoglobin 10.6; Platelets 134;  Potassium 3.9; Sodium 136  Recent Lipid Panel    Component Value Date/Time   CHOL 190 08/28/2022 1019   TRIG 139.0 08/28/2022 1019   TRIG  196 01/26/2010 0000   HDL 41.10 08/28/2022 1019   CHOLHDL 5 08/28/2022 1019   VLDL 27.8 08/28/2022 1019   LDLCALC 121 (H) 08/28/2022 1019   LDLDIRECT 69.5 01/24/2011 1125     Risk Assessment/Calculations:    CHA2DS2-VASc Score = 5   This indicates a 7.2% annual risk of stroke. The patient's score is based upon: CHF History: 0 HTN History: 1 Diabetes History: 1 Stroke History: 0 Vascular Disease History: 0 Age Score: 2 Gender Score: 1      Physical Exam:    VS:  BP 138/60   Pulse 67   Ht 5\' 2"  (1.575 m)   Wt 225 lb (102.1 kg)   SpO2 97%   BMI 41.15 kg/m     Wt Readings from Last 3 Encounters:  02/28/23 225 lb (102.1 kg)  02/26/23 222 lb 9.6 oz (101 kg)  01/23/23 225 lb 1.4 oz (102.1 kg)     GEN:  Well nourished, well developed in no acute distress HEENT: Normal NECK: No JVD LYMPHATICS: No lymphadenopathy CARDIAC: RRR, no murmurs, rubs, gallops RESPIRATORY:  Clear to auscultation without rales, wheezing or rhonchi  ABDOMEN: Soft, non-tender, non-distended MUSCULOSKELETAL:  No edema; No deformity  SKIN: Warm and dry NEUROLOGIC:  Alert and oriented x 3 PSYCHIATRIC:  Normal affect   ASSESSMENT:    1. Presence of Watchman left atrial appendage closure device   2. PAF (paroxysmal atrial fibrillation) (HCC)   3. Epistaxis, recurrent   4. Elevated coronary artery calcium score    PLAN:    In order of problems listed above:  PAF with epistaxis s/p LAAO with Watchman: continue Xarelto until 6/8. On 6/8 she will start Plavix 75mg  QD through 07/24/23. She will require dental SBE through that time- amoxicillin called in. She is scheduled for post Watchman CT imaging to ensure adequate device seal with no thrombus on 03/27/23.  CMET drawn 5/28 and creat normal.    Elevated coronary calcium score: will transition Plavix to aspirin 81 mg daily after 6 months of therapy .    Medication Adjustments/Labs and Tests Ordered: Current medicines are reviewed at length  with the patient today.  Concerns regarding medicines are outlined above.  No orders of the defined types were placed in this encounter.  Meds ordered this encounter  Medications   clopidogrel (PLAVIX) 75 MG tablet    Sig: Take 1 tablet (75 mg total) by mouth daily. START ON 6/3    Dispense:  90 tablet    Refill:  1    STOP XARELTO ON 6/3 AND START PLAVIX ON 6/3   rivaroxaban (XARELTO) 20 MG TABS tablet    Sig: Take 1 tablet (20 mg total) by mouth daily with supper. STOP ON 6/3    Dispense:  30 tablet    Refill:  5    STOP XARELTO ON 6/3 AND START PLAVIX ON 6/3   amoxicillin (AMOXIL) 500 MG tablet    Sig: Take 4 tablets (2,000 mg total) by mouth as directed. 1 HOUR PRIOR TO DENTAL APPOINTMENTS    Dispense:  12 tablet    Refill:  6    Patient Instructions  Medication Instructions:  Your physician has recommended you make the following change in your medication:  1.  STOP XARELTO ON 6/3 2. START PLAVIX 75 MG DAILY ON  6/3 3. Start Amoxicillin 500 mg, take 4 tablets by mouth 1 hour prior to dental procedures and cleanings.   *If you need a refill on your cardiac medications before your next appointment, please call your pharmacy*   Lab Work: NONE If you have labs (blood work) drawn today and your tests are completely normal, you will receive your results only by: MyChart Message (if you have MyChart) OR A paper copy in the mail If you have any lab test that is abnormal or we need to change your treatment, we will call you to review the results.   Testing/Procedures: SEE CT INSTRUCTION LETTER    Follow-Up: At [facility], you and your health needs are our priority.  As part of our continuing mission to provide you with exceptional heart care, we have created designated Provider Care Teams.  These Care Teams include your primary Cardiologist (physician) and Advanced Practice Providers (APPs -  Physician Assistants and Nurse Practitioners) who all work together to  provide you with the care you need, when you need it.  We recommend signing up for the patient portal called "MyChart".  Sign up information is provided on this After Visit Summary.  MyChart is used to connect with patients for Virtual Visits (Telemedicine).  Patients are able to view lab/test results, encounter notes, upcoming appointments, etc.  Non-urgent messages can be sent to your provider as well.   To learn more about what you can do with MyChart, go to [email or web address].    Your next appointment:   KEEP SCHEDULED FOLLOW-UP   Signed, Cline Crock, PA-C  02/28/2023 11:47 AM    [facility]

## 2023-02-28 ENCOUNTER — Ambulatory Visit: Payer: Medicare PPO | Attending: Internal Medicine | Admitting: Physician Assistant

## 2023-02-28 VITALS — BP 138/60 | HR 67 | Ht 62.0 in | Wt 225.0 lb

## 2023-02-28 DIAGNOSIS — R040 Epistaxis: Secondary | ICD-10-CM | POA: Diagnosis not present

## 2023-02-28 DIAGNOSIS — Z95818 Presence of other cardiac implants and grafts: Secondary | ICD-10-CM | POA: Diagnosis not present

## 2023-02-28 DIAGNOSIS — R931 Abnormal findings on diagnostic imaging of heart and coronary circulation: Secondary | ICD-10-CM

## 2023-02-28 DIAGNOSIS — I480 Paroxysmal atrial fibrillation: Secondary | ICD-10-CM | POA: Diagnosis not present

## 2023-02-28 MED ORDER — AMOXICILLIN 500 MG PO TABS: 2000.0000 mg | ORAL_TABLET | ORAL | 6 refills | Status: DC

## 2023-02-28 MED ORDER — CLOPIDOGREL BISULFATE 75 MG PO TABS: 75.0000 mg | ORAL_TABLET | Freq: Every day | ORAL | 1 refills | Status: DC

## 2023-02-28 MED ORDER — RIVAROXABAN 20 MG PO TABS: 20.0000 mg | ORAL_TABLET | Freq: Every day | ORAL | 5 refills | Status: DC

## 2023-02-28 NOTE — Patient Instructions (Addendum)
Medication Instructions:  Your physician has recommended you make the following change in your medication:  1.  STOP XARELTO ON 6/3 2. START PLAVIX 75 MG DAILY ON 6/3 3. Start Amoxicillin 500 mg, take 4 tablets by mouth 1 hour prior to dental procedures and cleanings.   *If you need a refill on your cardiac medications before your next appointment, please call your pharmacy*   Lab Work: NONE If you have labs (blood work) drawn today and your tests are completely normal, you will receive your results only by: MyChart Message (if you have MyChart) OR A paper copy in the mail If you have any lab test that is abnormal or we need to change your treatment, we will call you to review the results.   Testing/Procedures: SEE CT INSTRUCTION LETTER    Follow-Up: At [facility], you and your health needs are our priority.  As part of our continuing mission to provide you with exceptional heart care, we have created designated Provider Care Teams.  These Care Teams include your primary Cardiologist (physician) and Advanced Practice Providers (APPs -  Physician Assistants and Nurse Practitioners) who all work together to provide you with the care you need, when you need it.  We recommend signing up for the patient portal called "MyChart".  Sign up information is provided on this After Visit Summary.  MyChart is used to connect with patients for Virtual Visits (Telemedicine).  Patients are able to view lab/test results, encounter notes, upcoming appointments, etc.  Non-urgent messages can be sent to your provider as well.   To learn more about what you can do with MyChart, go to [email or web address].    Your next appointment:   KEEP SCHEDULED FOLLOW-UP

## 2023-03-03 NOTE — Addendum Note (Signed)
Addended by: Gunnar Fusi A on: 03/03/2023 09:51 AM   Modules accepted: Orders

## 2023-03-04 ENCOUNTER — Other Ambulatory Visit: Payer: Self-pay | Admitting: Family Medicine

## 2023-03-06 ENCOUNTER — Telehealth: Payer: Self-pay

## 2023-03-06 NOTE — Telephone Encounter (Signed)
Received VM to call patient.  Attempted to return call, but voicemail is full.  Will try again later.

## 2023-03-12 ENCOUNTER — Telehealth: Payer: Self-pay | Admitting: Gastroenterology

## 2023-03-12 NOTE — Telephone Encounter (Signed)
Received the results of most recent blood workup performed on 02/25/2023 which showed stable hemoglobin of 10.6, platelets 134, WBC 7.0, CMP with BUN 21, creatinine 0.7, sodium 136, potassium 3.9, albumin 3.3, AST 25, ALT 20, alkaline phosphatase 108, total bili 0.4, creatinine 0.7 and BUN 21.  Hi Mitzie,  Can you please schedule a follow up appointment for this patient in next available with me or any of the APPs?  She did not show up for her previous appointment in March but she needs an appointment.  Thanks,  Katrinka Blazing, MD Gastroenterology and Hepatology [facility]

## 2023-03-17 ENCOUNTER — Other Ambulatory Visit: Payer: Self-pay | Admitting: Family Medicine

## 2023-03-25 NOTE — Progress Notes (Deleted)
Referring Provider: Jeoffrey Massed, MD Primary Care Physician:  Jeoffrey Massed, MD Primary GI Physician: Dr. Levon Hedger   No chief complaint on file.   HPI:   [name] is a 77 y.o. female with past medical history of ulcerative colitis on infliximab, NASH cirrhosis, GERD, anxiety, hyperlipidemia, history of iron deficiency anemia, diabetes, neuropathy, paroxysmal atrial fibrillation s/p Watchman, OSA, IBS, fibromyalgia, who is presenting today for follow-up of UC and cirrhosis.    Patient was last seen in the office by Dr. Levon Hedger in December 2023.  She noted increased bowel frequency with significant urgency along with stool incontinence taking Imodium every other day.  Rare fresh blood in her stool when having multiple bowel movements per day.  Plan to check Remicade levels, rule out GI infection, check fecal calprotectin, start Benefiber daily, continue Imodium as needed.  Regarding cirrhosis, she had not experienced any decompensating events.  Plan to check liver ultrasound repeat MELD labs and AFP.  Would need to discuss repeat EGD at next appointment.  Labs completed 10/30/2022: Fecal calprotectin was elevated at 206. CRP within normal limits. C. difficile and GI pathogen panel were negative. Infliximab level 1.6, infliximab antibodies not detected. Remicade was increased to 10 mg/kg from 5 mg/kg every 8 weeks.  INR 1.5, CMP with AST 54, ALT 38, alk phos 142, total bilirubin 0.7, albumin 3.2, potassium 3.8, sodium 137, creatinine 0.7, glucose 421.  aFP within normal limits. MELD 3.0 was 12.   RUQ ultrasound 11/05/2022 with no focal liver lesion.  Labs dated 02/26/2023: WBC 7.0, hemoglobin 10.6, MCV 88, MCH 27.7, MCHC 31.5, platelets 134, glucose 410, creatinine 0.7, sodium 136, potassium 3.9, albumin 3.3, total bilirubin 0.4, alk phos 108, AST 25, ALT 20.  Today:   Ulcerative colitis:  ***  Anemia:  ?iron supplementation: ***  Cirrhosis:  Cirrhosis related  questions: Hematemesis/coffee ground emesis: No History of variceal bleeding: No Abdominal pain: No Abdominal distention/worsening ascites: some distetnion but no ascites Fever/chills: No Episodes of confusion/disorientation: No Taking diuretics?: No Prior history of banding?: No Prior episodes of SBP: No Last time liver imaging was performed: RUQ ultrasound 11/05/2022 with no focal liver lesion.  MELD 3.0 score: 12 on 10/30/22 . ***   Last EGD: 10/31/2021 - Normal hypopharynx. - Normal esophagus. - Z-line regular, 38 cm from the incisors. - Portal hypertensive gastropathy. - Normal duodenal bulb and second portion of the duodenum.   Repeat EGD in 1 year***   Last Colonoscopy: 09/15/2020 - Perianal skin tags found on perianal exam. - The examined portion of the ileum was normal. - Inactive (Mayo Score 0) ulcerative colitis, improved since the last examination. Biopsied. - Diverticulosis in the sigmoid colon.   FINAL MICROSCOPIC DIAGNOSIS:  A. COLON, CECUM. BIOPSY: - Benign colonic mucosa. - No active inflammation. - No dysplasia or malignancy.  B. COLON, 80 CM, BIOPSY: - Benign colonic mucosa. - No active inflammation. - No dysplasia or malignancy.  C. COLON, 70 CM, BIOPSY: - Benign colonic mucosa. - No active inflammation. - No dysplasia or malignancy.  D. COLON, 60 CM, BIOPSY: - Benign colonic mucosa. - No active inflammation. - No dysplasia or malignancy.  E. COLON, 50 CM, BIOPSY: - Benign colonic mucosa. - No active inflammation. - No dysplasia or malignancy.  F. COLON, 40 CM, BIOPSY: - Benign colonic mucosa. - No active inflammation. - No dysplasia or malignancy.  G. COLON, 30 CM, BIOPSY: - Benign colonic mucosa. - No active inflammation. - No dysplasia or  malignancy.  H. COLON, 20 CM, BIOPSY: - Chronic moderately active colitis. - No dysplasia or malignancy.  I. COLON, 10 CM, BIOPSY: - Benign colonic mucosa. - No active inflammation. - No  dysplasia or malignancy.    Recommended repeat colonoscopy in 3 years.  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 ? to get liver bx if she gets bariatric surgery? Mild portal hypertensive gastropathy on EGD 08/2019.   Dysrhythmia    Fibromyalgia    GAD (generalized anxiety disorder)    GERD    History of hiatal hernia    History of iron deficiency anemia 12/2018   Inadequate absorption secondary to chronic/long term PPI therapy + portal hypertensive gastroduodenopathy. No GIB found on EGD, colonosc, and givens. Iron infusions X multiple.   History of thrombocytopenia 12/2011   Hyperlipidemia    Intolerant of statins   HYPERTENSION    IBS (irritable bowel syndrome)    -D.  Good response to bentyl and imodium as of 06/2018 GI f/u.   IDDM (insulin dependent diabetes mellitus)    with DPN (managed by Dr. Elvera Lennox but then in 2018 pt preferred to have me manage for her convenience)   Limited mobility    Requires a walker for arthritic pain, widespread musculoskeletal pain, and neuropathic pain.   Morbid obesity (HCC)    As of 11/2018, pt considering sleeve gastectomy vs bipass as of eval by Dr. Alvino Blood considering as of 12/2019.   Nonalcoholic steatohepatitis    Viral Hep screens NEG.  CT 2015.  Transaminasemia.  U/S 07/2018 showed early changes of cirrhosis.   OSA (obstructive sleep apnea) 09/14/2015   sleep study 09/07/15: severe obstructive sleep apnea with an AHI of 72 and SaO2 low of 75%.>referred to sleep MD   Osteoarthritis    hips, shoulders, knees   PAF (paroxysmal atrial fibrillation) (HCC)    One documented episode (after getting EGD 2016).  Was on amiodarone x 3 mo.  Rate control with metoprolol + anticoag with xarelto.    PONV (postoperative nausea and vomiting)    Recurrent epistaxis    Granuloma in L nare cauterized  by ENT 04/2020. Another cautery 06/2020   Small fiber neuropathy    Due to DM.  Symmetric hands and feet tingling/numbness.   Ulcerative colitis (HCC)    Remicade infusion Q 8 weeks: in clinical and endoscopic remission as of 12/2018 GI f/u.  06/11/19 rpt colonoscopy->cecal and ascending colon colitis.    Past Surgical History:  Procedure Laterality Date   ABDOMINAL HYSTERECTOMY  1980   Paps no longer indicated.   BACK SURGERY     BACTERIAL OVERGROWTH TEST N/A 07/13/2015   Procedure: BACTERIAL OVERGROWTH TEST;  Surgeon: Malissa Hippo, MD;  [facility];  Service: Endoscopy;  Laterality: N/A;  730     BILATERAL SALPINGOOPHORECTOMY  02/10/2001   BIOPSY  06/11/2019   Procedure: BIOPSY;  Surgeon: Malissa Hippo, MD;  [facility];  Service: Endoscopy;;  colon   BIOPSY  09/27/2019   Procedure: BIOPSY;  Surgeon: Malissa Hippo, MD;  [facility];  Service: Endoscopy;;  gastric duodenal   BIOPSY  09/15/2020   Procedure: BIOPSY;  Surgeon: Dolores Frame, MD;  [facility];  Service: Gastroenterology;;   BREAST REDUCTION SURGERY  1994   bilat   BREAST SURGERY  CARDIOVASCULAR STRESS TEST  07/2010   Lexiscan myoview: normal   CARPAL TUNNEL RELEASE Right 1996   CARPAL TUNNEL RELEASE Left 03/21/2003   CARPAL TUNNEL RELEASE Right 05/04/2013   Procedure: CARPAL TUNNEL RELEASE;  Surgeon: Wyn Forster., MD;  Location: [facility];  Service: Orthopedics;  Laterality: Right;   CARPAL TUNNEL RELEASE Left 09/21/2013   Procedure: LEFT CARPAL TUNNEL RELEASE;  Surgeon: Wyn Forster., MD;  Location: [facility];  Service: Orthopedics;  Laterality: Left;   CHOLECYSTECTOMY     COLONOSCOPY WITH PROPOFOL N/A 08/04/2015   Colitis in remission.  No polyps.  Procedure: COLONOSCOPY WITH PROPOFOL;  Surgeon: Malissa Hippo, MD;  [facility]: [facility];  Service: Endoscopy;  Laterality: N/A;  cecum time in  0820   time  out  0827    total time 7 minutes   COLONOSCOPY WITH PROPOFOL N/A 06/11/2019   cecal and ascending colon colitis.  Procedure: COLONOSCOPY WITH PROPOFOL;  Surgeon: Malissa Hippo, MD;  [facility];  Service: Endoscopy;  Laterality: N/A;  730a   COLONOSCOPY WITH PROPOFOL N/A 09/15/2020   Procedure: COLONOSCOPY WITH PROPOFOL;  Surgeon: Dolores Frame, MD;  [facility];  Service: Gastroenterology;  Laterality: N/A;  1030   ESOPHAGEAL DILATION N/A 08/04/2015   Procedure: ESOPHAGEAL DILATION;  Surgeon: Malissa Hippo, MD;  Location: [facility];  Service: Endoscopy;  Laterality: N/ALauralee Evener, no mucousal disruption   ESOPHAGOGASTRODUODENOSCOPY  09/27/2019   Performed for IDA.  Esoph dilation was done but no stricture present.  Mild portal hypertensive gastropathy, o/w normal.  Duodenal bx NEG.  h pylori neg.   ESOPHAGOGASTRODUODENOSCOPY (EGD) WITH ESOPHAGEAL DILATION  12/02/2005   ESOPHAGOGASTRODUODENOSCOPY (EGD) WITH PROPOFOL N/A 08/04/2015   Procedure: ESOPHAGOGASTRODUODENOSCOPY (EGD) WITH PROPOFOL;  Surgeon: Malissa Hippo, MD;  [facility]: [facility];  Service: Endoscopy;  Laterality: N/A;  procedure 1   ESOPHAGOGASTRODUODENOSCOPY (EGD) WITH PROPOFOL N/A 09/27/2019   Procedure: ESOPHAGOGASTRODUODENOSCOPY (EGD) WITH PROPOFOL;  Surgeon: Malissa Hippo, MD;  [facility];  Service: Endoscopy;  Laterality: N/A;  12:10   ESOPHAGOGASTRODUODENOSCOPY (EGD) WITH PROPOFOL N/A 10/31/2021   Procedure: ESOPHAGOGASTRODUODENOSCOPY (EGD) WITH PROPOFOL;  Surgeon: Malissa Hippo, MD;  [facility];  Service: Endoscopy;  Laterality: N/A;  930   FLEXIBLE SIGMOIDOSCOPY  01/17/2012   Procedure: FLEXIBLE SIGMOIDOSCOPY;  Surgeon: Malissa Hippo, MD;  [facility];  Service: Endoscopy;  Laterality: N/A;   GIVENS CAPSULE STUDY N/A 08/03/2019   Procedure: GIVENS CAPSULE STUDY (performed for IDA)->some food debris in stomach and small amount of blood.   Surgeon: Malissa Hippo, MD;  [facility];  Service: Endoscopy;  Laterality: N/A;  730AM   HEMILAMINOTOMY LUMBAR SPINE Bilateral 09/07/1999   L4-5   KNEE ARTHROSCOPY Right 01/1999; 10/2000   LEFT ATRIAL APPENDAGE OCCLUSION N/A 01/23/2023   Procedure: LEFT ATRIAL APPENDAGE OCCLUSION;  Surgeon: [name], MD;  [facility];  Service: Cardiovascular;  Laterality: N/A;   LYSIS OF ADHESION  02/10/2001   MALONEY DILATION  09/27/2019   Procedure: Elease Hashimoto DILATION;  Surgeon: Malissa Hippo, MD;  [facility];  Service: Endoscopy;;   NASAL ENDOSCOPY WITH EPISTAXIS CONTROL Bilateral 01/25/2022   Procedure: NASAL ENDOSCOPY WITH EPISTAXIS CONTROL;  Surgeon: Osborn Coho, MD;  Location: [facility];  Service: ENT;  Laterality: Bilateral;   NASAL SEPTOPLASTY W/ TURBINOPLASTY Bilateral 01/25/2022   Procedure: NASAL SEPTOPLASTY WITH TURBINATE REDUCTION;  Surgeon: Osborn Coho, MD;  Location: [facility];  Service: ENT;  Laterality: Bilateral;   RECTOCELE REPAIR  1990; 09/12/2006   TARSAL TUNNEL RELEASE  2002   TEE WITHOUT CARDIOVERSION N/A 01/23/2023   Procedure: TRANSESOPHAGEAL ECHOCARDIOGRAM;  Surgeon: [name], MD;  Location: [facility] INVAS[facility] CV LAB;  Service: Cardiovascular;  Laterality: N/A;   TRANSTHORACIC ECHOCARDIOGRAM  08/04/2015   EF 60-65%, normal wall motion, mild LVH, mild LA dilation, grd I DD.   TUMOR EXCISION Left 03/21/2003   dorsal 1st web space (hand)   URETEROLYSIS Right 02/10/2001    Current Outpatient Medications  Medication Sig Dispense Refill   Accu-Chek Softclix Lancets lancets USE TO CHECK BLOOD GLUCOSE UP TO 6 TIMES DAILY AS DIRECTED 200 each 5   acetaminophen (TYLENOL) 325 MG tablet Take 650 mg by mouth every 6 (six) hours as needed for moderate pain or headache.     amoxicillin (AMOXIL) 500 MG tablet Take 4 tablets (2,000 mg total) by mouth as directed. 1 HOUR PRIOR TO DENTAL APPOINTMENTS 12 tablet 6   blood glucose meter kit and  supplies KIT Use up to six times daily as directed. DX. E11.9 1 each 0   canagliflozin (INVOKANA) 100 MG TABS tablet TAKE 1 TABLET BY MOUTH EVERY DAY BEFORE BREAKFAST 90 tablet 1   citalopram (CELEXA) 20 MG tablet TAKE 1 TABLET BY MOUTH EVERY DAY 90 tablet 1   clopidogrel (PLAVIX) 75 MG tablet Take 1 tablet (75 mg total) by mouth daily. START ON 6/3 90 tablet 1   dicyclomine (BENTYL) 10 MG capsule TAKE 1 CAPSULE (10 MG TOTAL) BY MOUTH EVERY 8 (EIGHT) HOURS AS NEEDED FOR SPASMS (ABDOMINAL PAIN). 270 capsule 1   diphenoxylate-atropine (LOMOTIL) 2.5-0.025 MG tablet Take 1 tablet by mouth 4 (four) times daily as needed for diarrhea or loose stools. 60 tablet 2   Dulaglutide (TRULICITY) 1.5 MG/0.5ML SOPN INJECT 1.5MG  ONCE WEEKLY 18 mL 3   esomeprazole (NEXIUM) 20 MG capsule TAKE 1 CAPSULE BY MOUTH DAILY BEFORE BREAKFAST 90 capsule 3   ferric carboxymaltose (INJECTAFER) 750 MG/15ML SOLN injection Inject 15 mLs (750 mg total) into the vein as directed for 1 dose. Repeat in 7 days 15 mL 1   furosemide (LASIX) 40 MG tablet Take 1 tablet (40 mg total) by mouth daily. 90 tablet 1   gabapentin (NEURONTIN) 600 MG tablet TAKE 1 AND 1/2 TABLETS BY MOUTH TWICE A DAY (Patient taking differently: Take 900 mg by mouth 2 (two) times daily. TAKE 1 AND 1/2 TABLETS BY MOUTH TWICE A DAY) 270 tablet 1   glucose blood (ACCU-CHEK GUIDE) test strip USE TO CHECK BLOOD GLUCOSE UP TO 6 TIMES DAILY AS DIRECTED 600 strip 3   INFLIXIMAB IV Inject into the vein every 8 (eight) weeks. Remicaid 5mg /kg Infusion every 8 weeks [facility])     Insulin Pen Needle (B-D UF III MINI PEN NEEDLES) 31G X 5 MM MISC USE TO INJECT INSULINS EQUAL TO 6 TIMES DAILY. 500 each 6   insulin regular human CONCENTRATED (HUMULIN R U-500 KWIKPEN) 500 UNIT/ML KwikPen Inject 100 Units into the skin 3 (three) times daily with meals. Pt stated 100 units for breakfast lunch and dinner 48 mL 0   lactase (LACTAID) 3000 UNITS tablet Take 3,000 Units by  mouth as needed (when eating foods containing dairy).      meclizine (ANTIVERT) 25 MG tablet Take 1 tablet (25 mg total) by mouth 3 (three) times daily as needed for dizziness or nausea. 90 tablet 0  metoprolol succinate (TOPROL-XL) 100 MG 24 hr tablet TAKE 1 TABLET BY MOUTH 2 TIMES DAILY. TAKE WITH OR IMMEDIATELY FOLLOWING A MEAL 180 tablet 1   potassium chloride (KLOR-CON M10) 10 MEQ tablet TAKE 1 TABLET BY MOUTH EVERY DAY 30 tablet 0   No current facility-administered medications for this visit.    Allergies as of 03/27/2023 - Review Complete 02/28/2023  Allergen Reaction Noted   Omeprazole Anaphylaxis and Swelling 08/14/2011   Actos [pioglitazone] Swelling and Other (See Comments) 03/02/2012   Benzocaine-menthol Swelling 06/26/2011   Colesevelam Other (See Comments) 10/18/2010   Flagyl [metronidazole hcl] Other (See Comments) 01/19/2012   Metformin and related Diarrhea 03/02/2012   Shrimp [shellfish allergy] Itching 04/27/2013   Statins Palpitations    Allevyn adhesive [wound dressings]  07/26/2021   Desipramine hcl Itching, Nausea Only, and Other (See Comments) 04/26/2014   Hydromorphone Itching 06/08/2013   Jardiance [empagliflozin] Other (See Comments) 02/13/2016   Lactose intolerance (gi) Diarrhea 06/02/2019   Adhesive [tape] Other (See Comments) 04/27/2013   Nisoldipine Itching 04/12/2010   Percocet [oxycodone-acetaminophen] Itching 02/18/2012    Family History  Problem Relation Age of Onset   Diabetes Mother    Hypertension Mother    Heart attack Father        Mid 63's   Heart disease Father    Lung disease Father        spot on lung; had lung surgery   Alcohol abuse Other    Hypertension Son    Diabetes Son    Emphysema Maternal Grandfather    Asthma Maternal Grandfather    Colon cancer Paternal Grandfather    Stomach cancer Paternal Grandmother    Neuropathy Neg Hx     Social History   Socioeconomic History   Marital status: Married    Spouse name: Not  on file   Number of children: Not on file   Years of education: Not on file   Highest education level: Not on file  Occupational History   Occupation: Retired  Tobacco Use   Smoking status: Never   Smokeless tobacco: Never  Vaping Use   Vaping Use: Never used  Substance and Sexual Activity   Alcohol use: Not Currently    Comment: ocassionally   Drug use: No   Sexual activity: Not Currently    Partners: Male    Birth control/protection: Surgical    Comment: hysterectomy  Other Topics Concern   Not on file  Social History Narrative   She lives with husband in [place] home.  They have one grown son and 2 grandchildren.   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   Highest of level education:  Some college.   Never smoker.   Alcohol: rare.   Social Determinants of Health   Financial Resource Strain: Low Risk  (01/15/2023)   Overall Financial Resource Strain (CARDIA)    Difficulty of Paying Living Expenses: Not hard at all  Food Insecurity: No Food Insecurity (01/27/2023)   Hunger Vital Sign    Worried About Running Out of Food in the Last Year: Never true    Ran Out of Food in the Last Year: Never true  Transportation Needs: No Transportation Needs (01/27/2023)   PRAPARE - Administrator, Civil Service (Medical): No    Lack of Transportation (Non-Medical): No  Physical Activity: Inactive (01/15/2023)   Exercise Vital Sign    Days of Exercise per Week: 0 days    Minutes of Exercise per Session: 0 min  Stress: No Stress Concern Present (01/15/2023)   [employer] of Occupational Health - Occupational Stress Questionnaire    Feeling of Stress : Not at all  Social Connections: Moderately Isolated (01/15/2023)   Social Connection and Isolation Panel [NHANES]    Frequency of Communication with Friends and Family: Three times a week    Frequency of Social Gatherings with Friends and Family: Once a week    Attends Religious Services: Never    Database administrator or  Organizations: No    Attends Engineer, structural: Not on file    Marital Status: Married    Review of Systems: Gen: Denies fever, chills, anorexia. Denies fatigue, weakness, weight loss.  CV: Denies chest pain, palpitations, syncope, peripheral edema, and claudication. Resp: Denies dyspnea at rest, cough, wheezing, coughing up blood, and pleurisy. GI: Denies vomiting blood, jaundice, and fecal incontinence.   Denies dysphagia or odynophagia. Derm: Denies rash, itching, dry skin Psych: Denies depression, anxiety, memory loss, confusion. No homicidal or suicidal ideation.  Heme: Denies bruising, bleeding, and enlarged lymph nodes.  Physical Exam: There were no vitals taken for this visit. General:   Alert and oriented. No distress noted. Pleasant and cooperative.  Head:  Normocephalic and atraumatic. Eyes:  Conjuctiva clear without scleral icterus. Heart:  S1, S2 present without murmurs appreciated. Lungs:  Clear to auscultation bilaterally. No wheezes, rales, or rhonchi. No distress.  Abdomen:  +BS, soft, non-tender and non-distended. No rebound or guarding. No HSM or masses noted. Msk:  Symmetrical without gross deformities. Normal posture. Extremities:  Without edema. Neurologic:  Alert and  oriented x4 Psych:  Normal mood and affect.    Assessment:     Plan:  ***   Ermalinda Memos, PA-C [facility] Gastroenterology 03/27/2023

## 2023-03-26 ENCOUNTER — Ambulatory Visit: Payer: Medicare PPO

## 2023-03-27 ENCOUNTER — Ambulatory Visit (HOSPITAL_COMMUNITY)
Admission: RE | Admit: 2023-03-27 | Discharge: 2023-03-27 | Disposition: A | Discharge status: Home / Self Care | Payer: Medicare PPO | Source: Ambulatory Visit | Attending: Cardiovascular Disease | Admitting: Cardiovascular Disease

## 2023-03-27 ENCOUNTER — Ambulatory Visit: Payer: Medicare PPO | Admitting: Gastroenterology

## 2023-03-27 ENCOUNTER — Encounter: Payer: Self-pay | Admitting: Gastroenterology

## 2023-03-27 DIAGNOSIS — I480 Paroxysmal atrial fibrillation: Secondary | ICD-10-CM | POA: Insufficient documentation

## 2023-03-27 DIAGNOSIS — Z95818 Presence of other cardiac implants and grafts: Secondary | ICD-10-CM | POA: Insufficient documentation

## 2023-03-27 MED ORDER — IOHEXOL 350 MG/ML SOLN
95.0000 mL | Freq: Once | INTRAVENOUS | Status: AC | PRN
  Administered 2023-03-27: 95 mL via INTRAVENOUS

## 2023-04-02 ENCOUNTER — Telehealth: Payer: Self-pay | Admitting: *Deleted

## 2023-04-02 NOTE — Telephone Encounter (Signed)
Pt called and states she received a letter stating that she missed an appointment with Ermalinda Memos PA. She asked who is this. I tried to explain to her who she was. She over talked me and said that she did not want to see her and she was a pt of Dr. Wilburt Finlay and that is who she wanted to see and if it is a problem she will find another doctor. Please make an OV with Dr. Levon Hedger.

## 2023-04-02 NOTE — Telephone Encounter (Signed)
Noted. Yes, please schedule with Dr. Levon Hedger per patient's request.

## 2023-04-08 ENCOUNTER — Ambulatory Visit: Payer: Medicare PPO | Admitting: Pulmonary Disease

## 2023-04-10 NOTE — Telephone Encounter (Signed)
Thanks

## 2023-04-21 ENCOUNTER — Telehealth: Payer: Self-pay | Admitting: Cardiology

## 2023-04-21 NOTE — Telephone Encounter (Signed)
Pt is requesting a callback regarding and ultrasound she stated Excell Seltzer mentioned to her after procedure was done. Please advise

## 2023-04-21 NOTE — Telephone Encounter (Signed)
Attempt to call the patient unable to leave voicemail due to voicemail box if full.

## 2023-04-22 DIAGNOSIS — K5180 Other ulcerative colitis without complications: Secondary | ICD-10-CM | POA: Diagnosis not present

## 2023-04-22 NOTE — Telephone Encounter (Signed)
Call to patient. VM is full, unable to LM

## 2023-04-23 NOTE — Progress Notes (Signed)
HPI: FU atrial fibrillation. Lexiscan Myoview in November of 2011 showed normal perfusion. Study not gated. CardioNet showed sinus with PVCs. Had EGD in November 2016 and developed atrial fibrillation. Prewatchman calcium score March 2024 showed calcium score 623 which is 89th percentile.  Patient had Watchman device placed April 2024.  Transesophageal echocardiogram following procedure showed normal LV function, small iatrogenic atrial septal defect.  Follow-up CTA June 2024 showed a 3 to 4 mm peridevice leak of the left atrial appendage closure device.  Follow-up recommended in 6 months.  Since she was last seen, she denies dyspnea, chest pain, palpitations or syncope.  Chronic pedal edema.  Current Outpatient Medications  Medication Sig Dispense Refill   Accu-Chek Softclix Lancets lancets USE TO CHECK BLOOD GLUCOSE UP TO 6 TIMES DAILY AS DIRECTED 200 each 5   acetaminophen (TYLENOL) 325 MG tablet Take 650 mg by mouth every 6 (six) hours as needed for moderate pain or headache.     amoxicillin (AMOXIL) 500 MG tablet Take 4 tablets (2,000 mg total) by mouth as directed. 1 HOUR PRIOR TO DENTAL APPOINTMENTS 12 tablet 6   blood glucose meter kit and supplies KIT Use up to six times daily as directed. DX. E11.9 1 each 0   canagliflozin (INVOKANA) 100 MG TABS tablet TAKE 1 TABLET BY MOUTH EVERY DAY BEFORE BREAKFAST 90 tablet 1   citalopram (CELEXA) 20 MG tablet TAKE 1 TABLET BY MOUTH EVERY DAY 90 tablet 1   clopidogrel (PLAVIX) 75 MG tablet Take 1 tablet (75 mg total) by mouth daily. START ON 6/3 90 tablet 1   dicyclomine (BENTYL) 10 MG capsule TAKE 1 CAPSULE (10 MG TOTAL) BY MOUTH EVERY 8 (EIGHT) HOURS AS NEEDED FOR SPASMS (ABDOMINAL PAIN). 270 capsule 1   diphenoxylate-atropine (LOMOTIL) 2.5-0.025 MG tablet Take 1 tablet by mouth 4 (four) times daily as needed for diarrhea or loose stools. 60 tablet 2   Dulaglutide (TRULICITY) 1.5 MG/0.5ML SOPN INJECT 1.5MG  ONCE WEEKLY 18 mL 3   esomeprazole  (NEXIUM) 20 MG capsule TAKE 1 CAPSULE BY MOUTH DAILY BEFORE BREAKFAST 90 capsule 3   ferric carboxymaltose (INJECTAFER) 750 MG/15ML SOLN injection Inject 15 mLs (750 mg total) into the vein as directed for 1 dose. Repeat in 7 days 15 mL 1   furosemide (LASIX) 40 MG tablet Take 1 tablet (40 mg total) by mouth daily. 90 tablet 1   gabapentin (NEURONTIN) 600 MG tablet TAKE 1 AND 1/2 TABLETS BY MOUTH TWICE A DAY (Patient taking differently: Take 900 mg by mouth 2 (two) times daily. TAKE 1 AND 1/2 TABLETS BY MOUTH TWICE A DAY) 270 tablet 1   glucose blood (ACCU-CHEK GUIDE) test strip USE TO CHECK BLOOD GLUCOSE UP TO 6 TIMES DAILY AS DIRECTED 600 strip 3   INFLIXIMAB IV Inject into the vein every 8 (eight) weeks. Remicaid 5mg /kg Infusion every 8 weeks [facility])     Insulin Pen Needle (B-D UF III MINI PEN NEEDLES) 31G X 5 MM MISC USE TO INJECT INSULINS EQUAL TO 6 TIMES DAILY. 500 each 6   insulin regular human CONCENTRATED (HUMULIN R U-500 KWIKPEN) 500 UNIT/ML KwikPen Inject 100 Units into the skin 3 (three) times daily with meals. Pt stated 100 units for breakfast lunch and dinner 48 mL 0   lactase (LACTAID) 3000 UNITS tablet Take 3,000 Units by mouth as needed (when eating foods containing dairy).      meclizine (ANTIVERT) 25 MG tablet Take 1 tablet (25 mg total) by mouth  3 (three) times daily as needed for dizziness or nausea. 90 tablet 0   metoprolol succinate (TOPROL-XL) 100 MG 24 hr tablet TAKE 1 TABLET BY MOUTH 2 TIMES DAILY. TAKE WITH OR IMMEDIATELY FOLLOWING A MEAL 180 tablet 1   potassium chloride (KLOR-CON M10) 10 MEQ tablet TAKE 1 TABLET BY MOUTH EVERY DAY 30 tablet 0   No current facility-administered medications for this visit.     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 ? to get liver bx if she gets bariatric surgery?  Mild portal hypertensive gastropathy on EGD 08/2019.   Dysrhythmia    Fibromyalgia    GAD (generalized anxiety disorder)    GERD    History of hiatal hernia    History of iron deficiency anemia 12/2018   Inadequate absorption secondary to chronic/long term PPI therapy + portal hypertensive gastroduodenopathy. No GIB found on EGD, colonosc, and givens. Iron infusions X multiple.   History of thrombocytopenia 12/2011   Hyperlipidemia    Intolerant of statins   HYPERTENSION    IBS (irritable bowel syndrome)    -D.  Good response to bentyl and imodium as of 06/2018 GI f/u.   IDDM (insulin dependent diabetes mellitus)    with DPN (managed by Dr. Elvera Lennox but then in 2018 pt preferred to have me manage for her convenience)   Limited mobility    Requires a walker for arthritic pain, widespread musculoskeletal pain, and neuropathic pain.   Morbid obesity (HCC)    As of 11/2018, pt considering sleeve gastectomy vs bipass as of eval by Dr. Alvino Blood considering as of 12/2019.   Nonalcoholic steatohepatitis    Viral Hep screens NEG.  CT 2015.  Transaminasemia.  U/S 07/2018 showed early changes of cirrhosis.   OSA (obstructive sleep apnea) 09/14/2015   sleep study 09/07/15: severe obstructive sleep apnea with an AHI of 72 and SaO2 low of 75%.>referred to sleep MD   Osteoarthritis    hips, shoulders, knees   PAF (paroxysmal atrial fibrillation) (HCC)    One documented episode (after getting EGD 2016).  Was on amiodarone x 3 mo.  Rate control with metoprolol + anticoag with xarelto.    PONV (postoperative nausea and vomiting)    Recurrent epistaxis    Granuloma in L nare cauterized by ENT 04/2020. Another cautery 06/2020   Small fiber neuropathy    Due to DM.  Symmetric hands and feet tingling/numbness.   Ulcerative colitis (HCC)    Remicade infusion Q 8 weeks: in clinical and endoscopic remission as of 12/2018 GI f/u.  06/11/19 rpt colonoscopy->cecal and ascending colon colitis.    Past Surgical  History:  Procedure Laterality Date   ABDOMINAL HYSTERECTOMY  1980   Paps no longer indicated.   BACK SURGERY     BACTERIAL OVERGROWTH TEST N/A 07/13/2015   Procedure: BACTERIAL OVERGROWTH TEST;  Surgeon: Malissa Hippo, MD;  [facility];  Service: Endoscopy;  Laterality: N/A;  730     BILATERAL SALPINGOOPHORECTOMY  02/10/2001   BIOPSY  06/11/2019   Procedure: BIOPSY;  Surgeon: Malissa Hippo, MD;  [facility];  Service: Endoscopy;;  colon   BIOPSY  09/27/2019   Procedure: BIOPSY;  Surgeon: Malissa Hippo, MD;  [facility];  Service: Endoscopy;;  gastric duodenal   BIOPSY  09/15/2020   Procedure: BIOPSY;  Surgeon: Marguerita Merles, Reuel Boom, MD;  [facility];  Service: Gastroenterology;;   BREAST REDUCTION SURGERY  1994   bilat   BREAST SURGERY     CARDIOVASCULAR STRESS TEST  07/2010   Lexiscan myoview: normal   CARPAL TUNNEL RELEASE Right 1996   CARPAL TUNNEL RELEASE Left 03/21/2003   CARPAL TUNNEL RELEASE Right 05/04/2013   Procedure: CARPAL TUNNEL RELEASE;  Surgeon: Wyn Forster., MD;  Location: [facility];  Service: Orthopedics;  Laterality: Right;   CARPAL TUNNEL RELEASE Left 09/21/2013   Procedure: LEFT CARPAL TUNNEL RELEASE;  Surgeon: Wyn Forster., MD;  Location: [facility];  Service: Orthopedics;  Laterality: Left;   CHOLECYSTECTOMY     COLONOSCOPY WITH PROPOFOL N/A 08/04/2015   Colitis in remission.  No polyps.  Procedure: COLONOSCOPY WITH PROPOFOL;  Surgeon: Malissa Hippo, MD;  [facility]: [facility];  Service: Endoscopy;  Laterality: N/A;  cecum time in  0820   time out  0827    total time 7 minutes   COLONOSCOPY WITH PROPOFOL N/A 06/11/2019   cecal and ascending colon colitis.  Procedure: COLONOSCOPY WITH PROPOFOL;  Surgeon: Malissa Hippo, MD;  [facility];  Service: Endoscopy;  Laterality: N/A;  730a   COLONOSCOPY WITH PROPOFOL N/A 09/15/2020   Procedure:  COLONOSCOPY WITH PROPOFOL;  Surgeon: Dolores Frame, MD;  [facility];  Service: Gastroenterology;  Laterality: N/A;  1030   ESOPHAGEAL DILATION N/A 08/04/2015   Procedure: ESOPHAGEAL DILATION;  Surgeon: Malissa Hippo, MD;  [facility]: [facility];  Service: Endoscopy;  Laterality: N/ALauralee Evener, no mucousal disruption   ESOPHAGOGASTRODUODENOSCOPY  09/27/2019   Performed for IDA.  Esoph dilation was done but no stricture present.  Mild portal hypertensive gastropathy, o/w normal.  Duodenal bx NEG.  h pylori neg.   ESOPHAGOGASTRODUODENOSCOPY (EGD) WITH ESOPHAGEAL DILATION  12/02/2005   ESOPHAGOGASTRODUODENOSCOPY (EGD) WITH PROPOFOL N/A 08/04/2015   Procedure: ESOPHAGOGASTRODUODENOSCOPY (EGD) WITH PROPOFOL;  Surgeon: Malissa Hippo, MD;  [facility];  Service: Endoscopy;  Laterality: N/A;  procedure 1   ESOPHAGOGASTRODUODENOSCOPY (EGD) WITH PROPOFOL N/A 09/27/2019   Procedure: ESOPHAGOGASTRODUODENOSCOPY (EGD) WITH PROPOFOL;  Surgeon: Malissa Hippo, MD;  [facility];  Service: Endoscopy;  Laterality: N/A;  12:10   ESOPHAGOGASTRODUODENOSCOPY (EGD) WITH PROPOFOL N/A 10/31/2021   Procedure: ESOPHAGOGASTRODUODENOSCOPY (EGD) WITH PROPOFOL;  Surgeon: Malissa Hippo, MD;  [facility];  Service: Endoscopy;  Laterality: N/A;  930   FLEXIBLE SIGMOIDOSCOPY  01/17/2012   Procedure: FLEXIBLE SIGMOIDOSCOPY;  Surgeon: Malissa Hippo, MD;  [facility];  Service: Endoscopy;  Laterality: N/A;   GIVENS CAPSULE STUDY N/A 08/03/2019   Procedure: GIVENS CAPSULE STUDY (performed for IDA)->some food debris in stomach and small amount of blood.  Surgeon: Malissa Hippo, MD;  [facility];  Service: Endoscopy;  Laterality: N/A;  730AM   HEMILAMINOTOMY LUMBAR SPINE Bilateral 09/07/1999   L4-5   KNEE ARTHROSCOPY Right 01/1999; 10/2000   LEFT ATRIAL APPENDAGE OCCLUSION N/A 01/23/2023   Procedure: LEFT ATRIAL APPENDAGE OCCLUSION;  Surgeon: [name], MD;  Location: [facility];  Service: Cardiovascular;  Laterality: N/A;   LYSIS OF ADHESION  02/10/2001   MALONEY DILATION  09/27/2019   Procedure: Elease Hashimoto DILATION;  Surgeon: Malissa Hippo, MD;  [facility];  Service: Endoscopy;;   NASAL ENDOSCOPY WITH EPISTAXIS CONTROL Bilateral 01/25/2022   Procedure: NASAL ENDOSCOPY WITH EPISTAXIS CONTROL;  Surgeon: Osborn Coho, MD;  Location: [facility];  Service: ENT;  Laterality: Bilateral;   NASAL SEPTOPLASTY W/ TURBINOPLASTY Bilateral 01/25/2022   Procedure: NASAL SEPTOPLASTY WITH TURBINATE REDUCTION;  Surgeon: Osborn Coho, MD;  Location: [facility] OR;  Service: ENT;  Laterality: Bilateral;   RECTOCELE REPAIR  1990; 09/12/2006   TARSAL TUNNEL RELEASE  2002   TEE WITHOUT CARDIOVERSION N/A 01/23/2023   Procedure: TRANSESOPHAGEAL ECHOCARDIOGRAM;  Surgeon: [name], MD;  Location: [facility] INVASIVE CV LAB;  Service: Cardiovascular;  Laterality: N/A;   TRANSTHORACIC ECHOCARDIOGRAM  08/04/2015   EF 60-65%, normal wall motion, mild LVH, mild LA dilation, grd I DD.   TUMOR EXCISION Left 03/21/2003   dorsal 1st web space (hand)   URETEROLYSIS Right 02/10/2001    Social History   Socioeconomic History   Marital status: Married    Spouse name: Not on file   Number of children: Not on file   Years of education: Not on file   Highest education level: Not on file  Occupational History   Occupation: Retired  Tobacco Use   Smoking status: Never   Smokeless tobacco: Never  Vaping Use   Vaping status: Never Used  Substance and Sexual Activity   Alcohol use: Not Currently    Comment: ocassionally   Drug use: No   Sexual activity: Not Currently    Partners: Male    Birth control/protection: Surgical    Comment: hysterectomy  Other Topics Concern   Not on file  Social History Narrative   She lives with husband in [place] home.  They have one grown son and 2 grandchildren.   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   Highest of  level education:  Some college.   Never smoker.   Alcohol: rare.   Social Determinants of Health   Financial Resource Strain: Low Risk  (01/15/2023)   Overall Financial Resource Strain (CARDIA)    Difficulty of Paying Living Expenses: Not hard at all  Food Insecurity: No Food Insecurity (01/27/2023)   Hunger Vital Sign    Worried About Running Out of Food in the Last Year: Never true    Ran Out of Food in the Last Year: Never true  Transportation Needs: No Transportation Needs (01/27/2023)   PRAPARE - Administrator, Civil Service (Medical): No    Lack of Transportation (Non-Medical): No  Physical Activity: Inactive (01/15/2023)   Exercise Vital Sign    Days of Exercise per Week: 0 days    Minutes of Exercise per Session: 0 min  Stress: No Stress Concern Present (01/15/2023)   [employer] of Occupational Health - Occupational Stress Questionnaire    Feeling of Stress : Not at all  Social Connections: Moderately Isolated (01/15/2023)   Social Connection and Isolation Panel [NHANES]    Frequency of Communication with Friends and Family: Three times a week    Frequency of Social Gatherings with Friends and Family: Once a week    Attends Religious Services: Never    Database administrator or Organizations: No    Attends Engineer, structural: Not on file    Marital Status: Married  Catering manager Violence: Not At Risk (01/15/2023)   Humiliation, Afraid, Rape, and Kick questionnaire    Fear of Current or Ex-Partner: No    Emotionally Abused: No    Physically Abused: No    Sexually Abused: No    Family History  Problem Relation Age of Onset   Diabetes Mother    Hypertension  Mother    Heart attack Father        Mid 45's   Heart disease Father    Lung disease Father        spot on lung; had lung surgery   Alcohol abuse Other    Hypertension Son    Diabetes Son    Emphysema Maternal Grandfather    Asthma Maternal Grandfather    Colon cancer Paternal  Grandfather    Stomach cancer Paternal Grandmother    Neuropathy Neg Hx     ROS: no fevers or chills, productive cough, hemoptysis, dysphasia, odynophagia, melena, hematochezia, dysuria, hematuria, rash, seizure activity, orthopnea, PND, claudication. Remaining systems are negative.  Physical Exam: Well-developed well-nourished in no acute distress.  Skin is warm and dry.  HEENT is normal.  Neck is supple.  Chest is clear to auscultation with normal expansion.  Cardiovascular exam is regular rate and rhythm.  Abdominal exam nontender or distended. No masses palpated. Extremities show 1+ edema. neuro grossly intact   A/P  1 paroxysmal atrial fibrillation-patient remains in sinus rhythm on exam.  Continue metoprolol.  She is now status post Watchman and anticoagulation has therefore been discontinued.  Continue Plavix until July 24, 2023.  Note follow-up CTA in December to reassess leak.  2 hypertension-blood pressure controlled.  Continue present medications.  3 hyperlipidemia-check lipids.  Given coronary calcification I would like for her LDL to be 55 or less.  She did not tolerate statins in the past.  If she is not at goal we will consider addition of Zetia or PCSK9 inhibitor.  4 coronary calcification-noted on previous CTA.  Once Plavix discontinued would begin aspirin 81 mg daily.  Olga Millers, MD

## 2023-04-23 NOTE — Telephone Encounter (Signed)
Call to patient,  Unable to LM as VM is full.  She can speak with Dr Jens Som at her appt August 8th.

## 2023-04-25 ENCOUNTER — Encounter (INDEPENDENT_AMBULATORY_CARE_PROVIDER_SITE_OTHER): Payer: Self-pay | Admitting: *Deleted

## 2023-05-06 ENCOUNTER — Telehealth: Payer: Self-pay

## 2023-05-06 ENCOUNTER — Encounter: Payer: Self-pay | Admitting: Cardiology

## 2023-05-06 ENCOUNTER — Ambulatory Visit: Payer: Medicare PPO | Attending: Cardiology | Admitting: Cardiology

## 2023-05-06 VITALS — BP 112/64 | HR 68 | Ht 62.0 in | Wt 234.8 lb

## 2023-05-06 DIAGNOSIS — I480 Paroxysmal atrial fibrillation: Secondary | ICD-10-CM | POA: Diagnosis not present

## 2023-05-06 DIAGNOSIS — E7800 Pure hypercholesterolemia, unspecified: Secondary | ICD-10-CM | POA: Diagnosis not present

## 2023-05-06 DIAGNOSIS — R931 Abnormal findings on diagnostic imaging of heart and coronary circulation: Secondary | ICD-10-CM

## 2023-05-06 DIAGNOSIS — Z95818 Presence of other cardiac implants and grafts: Secondary | ICD-10-CM | POA: Diagnosis not present

## 2023-05-06 DIAGNOSIS — I10 Essential (primary) hypertension: Secondary | ICD-10-CM

## 2023-05-06 NOTE — Patient Instructions (Signed)
Medication Instructions:   AFTER STOPPING PLAVIX IN OCTOBER-START ASPIRIN 81 MG ONCE DAILY  *If you need a refill on your cardiac medications before your next appointment, please call your pharmacy*   Follow-Up: At [facility], you and your health needs are our priority.  As part of our continuing mission to provide you with exceptional heart care, we have created designated Provider Care Teams.  These Care Teams include your primary Cardiologist (physician) and Advanced Practice Providers (APPs -  Physician Assistants and Nurse Practitioners) who all work together to provide you with the care you need, when you need it.  We recommend signing up for the patient portal called "MyChart".  Sign up information is provided on this After Visit Summary.  MyChart is used to connect with patients for Virtual Visits (Telemedicine).  Patients are able to view lab/test results, encounter notes, upcoming appointments, etc.  Non-urgent messages can be sent to your provider as well.   To learn more about what you can do with MyChart, go to [email or web address].    Your next appointment:   12 month(s)  Provider:   Olga Millers, MD

## 2023-05-06 NOTE — Telephone Encounter (Signed)
The patient received her letter about her CT (see 03/27/2023 CT results). The patient lives in [place] and wishes to limit number of trips to [place]. She is scheduled for her 6 month LAAO visit on 07/23/2023. Will arrange CT after that visit. She understands she will be called with results. She was grateful for call and agreed with plan.

## 2023-05-08 ENCOUNTER — Encounter (INDEPENDENT_AMBULATORY_CARE_PROVIDER_SITE_OTHER): Payer: Self-pay | Admitting: Gastroenterology

## 2023-05-08 ENCOUNTER — Ambulatory Visit (INDEPENDENT_AMBULATORY_CARE_PROVIDER_SITE_OTHER): Payer: Medicare PPO | Admitting: Gastroenterology

## 2023-05-08 VITALS — BP 117/54 | HR 74 | Temp 97.8°F | Ht 62.0 in | Wt 233.7 lb

## 2023-05-08 DIAGNOSIS — K7460 Unspecified cirrhosis of liver: Secondary | ICD-10-CM | POA: Diagnosis not present

## 2023-05-08 DIAGNOSIS — K580 Irritable bowel syndrome with diarrhea: Secondary | ICD-10-CM

## 2023-05-08 DIAGNOSIS — K5180 Other ulcerative colitis without complications: Secondary | ICD-10-CM

## 2023-05-08 LAB — CBC WITH DIFFERENTIAL/PLATELET
Absolute Monocytes: 409 cells/uL (ref 200–950)
Basophils Absolute: 40 cells/uL (ref 0–200)
Basophils Relative: 0.9 %
Eosinophils Absolute: 101 cells/uL (ref 15–500)
Eosinophils Relative: 2.3 %
HCT: 30.2 % — ABNORMAL LOW (ref 35.0–45.0)
Hemoglobin: 8.8 g/dL — ABNORMAL LOW (ref 11.7–15.5)
Lymphs Abs: 2477 cells/uL (ref 850–3900)
MCH: 24.7 pg — ABNORMAL LOW (ref 27.0–33.0)
MCHC: 29.1 g/dL — ABNORMAL LOW (ref 32.0–36.0)
MCV: 84.8 fL (ref 80.0–100.0)
MPV: 12 fL (ref 7.5–12.5)
Monocytes Relative: 9.3 %
Neutro Abs: 1373 cells/uL — ABNORMAL LOW (ref 1500–7800)
Neutrophils Relative %: 31.2 %
Platelets: 141 10*3/uL (ref 140–400)
RBC: 3.56 10*6/uL — ABNORMAL LOW (ref 3.80–5.10)
RDW: 15.2 % — ABNORMAL HIGH (ref 11.0–15.0)
Total Lymphocyte: 56.3 %
WBC: 4.4 10*3/uL (ref 3.8–10.8)

## 2023-05-08 NOTE — Patient Instructions (Addendum)
Continue Remicade 10 mg /kg every 8 weeks Perform blood workup Will order infliximab levels next appointment  Please bring the dates of your next infusion appointment to our clinic Schedule liver US Will order repeat EGD in next appointment Reduce salt intake to <2 g per day Can take Tylenol max of 2 g per day (650 mg q8h) for pain Avoid NSAIDs for pain Avoid eating raw oysters/shellfish Protein shake (Ensure) every night before going to sleep Implement a Mediterranean diet

## 2023-05-08 NOTE — Progress Notes (Signed)
Katrinka Blazing, M.D. Gastroenterology & Hepatology [facility] Gastroenterology [address] [place], [place] [address]  Primary Care Physician: Jeoffrey Massed, MD [address] [place] [address]  I will communicate my assessment and recommendations to the referring MD via EMR.  Problems: Ulcerative colitis on infliximab NASH cirrhosis   History of Present Illness: [name] is a 77 y.o. female with past medical history of ulcerative colitis on infliximab, NASH cirrhosis, atrial fibrillation status post watchman placement, GERD, anxiety, hyperlipidemia, history of iron deficiency anemia, diabetes, neuropathy, OSA, IBS, fibromyalgia, who presents for follow up of ulcerative colitis and cirrhosis.  The patient was last seen on 09/12/2022. At that time, the patient was advised to continue Remicade every 8 weeks.  Since her fecal calprotectin was elevated in the past, we checked her infliximab levels which were less than 5 mg/mL.  The patient had an elevated fecal calprotectin of 206.  Due to this, I prescribed an increase in the frequency to every 6 weeks.  However, the rheumatology office that provided the infusions stated that they could not increase the frequency.  Due to this I resubmitted an order for 10 mg/kg every 8 weeks.  Patient was complaining of diarrhea and had celiac testing and GI pathogen panel checked which were negative.  Patient reports that she had two infusions so far since her dose was increased. She reports that after dose was increased, the frequency initially improved but after the second dose, she noticed having urgency. She is having 1 BM every day, with soft consistency. No melena or hematochezia. Sometimes has some bouts of diarrhea but she does not know when this will happen. She very rarely takes Lomotil, believes she has taken 3 pills in the last time she was seen in the clinic.  The patient denies having any nausea,  vomiting, fever, chills, hematochezia, melena, hematemesis, abdominal distention, abdominal pain, jaundice, pruritus . Has gained 6 lb since last appointment.  Most recent labs from 02/17/2023 showed a CBC with hemoglobin of 10.6, platelets 134, CMP with AST 25, ALT 20, albumin 3.3, creatinine 8.5, normal electrolytes.  Cirrhosis related questions: Hematemesis/coffee ground emesis: No History of variceal bleeding: No Abdominal pain: No Abdominal distention/worsening ascites:  No Fever/chills: No Episodes of confusion/disorientation: No Taking diuretics?: No Prior history of banding?: No Prior episodes of SBP: No Last time liver imaging was performed:11/05/22 - [place] RUQ/doppler - no liver masses MELD score: 09/2022 - 12 Last AFP 1/24 - 3.8 Hepatitis A: immune Hepatitis B: has not received vaccination Most recent hepatitis B surface antigen and QuantiFERON testing: Negative on December 2023  Last EGD:10/31/2021 - Normal hypopharynx. - Normal esophagus. - Z- line regular, 38 cm from the incisors. - Portal hypertensive gastropathy. - Normal duodenal bulb and second portion of the duodenum.  Recommended a repeat EGD in 1 year  Last Colonoscopy: 09/15/2020 - Perianal skin tags found on perianal exam. - The examined portion of the ileum was normal. - Inactive (Mayo Score 0) ulcerative colitis, improved since the last examination. Biopsied. - Diverticulosis in the sigmoid colon.   FINAL MICROSCOPIC DIAGNOSIS:  A. COLON, CECUM. BIOPSY: - Benign colonic mucosa. - No active inflammation. - No dysplasia or malignancy.  B. COLON, 80 CM, BIOPSY: - Benign colonic mucosa. - No active inflammation. - No dysplasia or malignancy.  C. COLON, 70 CM, BIOPSY: - Benign colonic mucosa. - No active inflammation. - No dysplasia or malignancy.  D. COLON, 60 CM, BIOPSY: -  Benign colonic mucosa. - No active inflammation. - No dysplasia or malignancy.  E. COLON, 50 CM, BIOPSY: - Benign colonic  mucosa. - No active inflammation. - No dysplasia or malignancy.  F. COLON, 40 CM, BIOPSY: - Benign colonic mucosa. - No active inflammation. - No dysplasia or malignancy.  G. COLON, 30 CM, BIOPSY: - Benign colonic mucosa. - No active inflammation. - No dysplasia or malignancy.  H. COLON, 20 CM, BIOPSY: - Chronic moderately active colitis. - No dysplasia or malignancy.  I. COLON, 10 CM, BIOPSY: - Benign colonic mucosa. - No active inflammation. - No dysplasia or malignancy.    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 ? to get liver bx if she gets bariatric surgery? Mild portal hypertensive gastropathy on EGD 08/2019.   Dysrhythmia    Fibromyalgia    GAD (generalized anxiety disorder)    GERD    History of hiatal hernia    History of iron deficiency anemia 12/2018   Inadequate absorption secondary to chronic/long term PPI therapy + portal hypertensive gastroduodenopathy. No GIB found on EGD, colonosc, and givens. Iron infusions X multiple.   History of thrombocytopenia 12/2011   Hyperlipidemia    Intolerant of statins   HYPERTENSION    IBS (irritable bowel syndrome)    -D.  Good response to bentyl and imodium as of 06/2018 GI f/u.   IDDM (insulin dependent diabetes mellitus)    with DPN (managed by Dr. Elvera Lennox but then in 2018 pt preferred to have me manage for her convenience)   Limited mobility    Requires a walker for arthritic pain, widespread musculoskeletal pain, and neuropathic pain.   Morbid obesity (HCC)    As of 11/2018, pt considering sleeve gastectomy vs bipass as of eval by Dr. Alvino Blood considering as of 12/2019.   Nonalcoholic steatohepatitis    Viral Hep screens NEG.  CT 2015.  Transaminasemia.  U/S 07/2018 showed early  changes of cirrhosis.   OSA (obstructive sleep apnea) 09/14/2015   sleep study 09/07/15: severe obstructive sleep apnea with an AHI of 72 and SaO2 low of 75%.>referred to sleep MD   Osteoarthritis    hips, shoulders, knees   PAF (paroxysmal atrial fibrillation) (HCC)    One documented episode (after getting EGD 2016).  Was on amiodarone x 3 mo.  Rate control with metoprolol + anticoag with xarelto.    PONV (postoperative nausea and vomiting)    Recurrent epistaxis    Granuloma in L nare cauterized by ENT 04/2020. Another cautery 06/2020   Small fiber neuropathy    Due to DM.  Symmetric hands and feet tingling/numbness.   Ulcerative colitis (HCC)    Remicade infusion Q 8 weeks: in clinical and endoscopic remission as of 12/2018 GI f/u.  06/11/19 rpt colonoscopy->cecal and ascending colon colitis.    Past Surgical History: Past Surgical History:  Procedure Laterality Date   ABDOMINAL HYSTERECTOMY  1980   Paps no longer indicated.   BACK SURGERY     BACTERIAL OVERGROWTH TEST N/A 07/13/2015   Procedure: BACTERIAL OVERGROWTH TEST;  Surgeon: Malissa Hippo, MD;  [facility];  Service: Endoscopy;  Laterality: N/A;  730     BILATERAL SALPINGOOPHORECTOMY  02/10/2001   BIOPSY  06/11/2019   Procedure: BIOPSY;  Surgeon:  Malissa Hippo, MD;  [facility];  Service: Endoscopy;;  colon   BIOPSY  09/27/2019   Procedure: BIOPSY;  Surgeon: Malissa Hippo, MD;  [facility];  Service: Endoscopy;;  gastric duodenal   BIOPSY  09/15/2020   Procedure: BIOPSY;  Surgeon: Dolores Frame, MD;  [facility];  Service: Gastroenterology;;   BREAST REDUCTION SURGERY  1994   bilat   BREAST SURGERY     CARDIOVASCULAR STRESS TEST  07/2010   Lexiscan myoview: normal   CARPAL TUNNEL RELEASE Right 1996   CARPAL TUNNEL RELEASE Left 03/21/2003   CARPAL TUNNEL RELEASE Right 05/04/2013   Procedure: CARPAL TUNNEL RELEASE;  Surgeon: Wyn Forster., MD;   Location: [facility];  Service: Orthopedics;  Laterality: Right;   CARPAL TUNNEL RELEASE Left 09/21/2013   Procedure: LEFT CARPAL TUNNEL RELEASE;  Surgeon: Wyn Forster., MD;  Location: [facility];  Service: Orthopedics;  Laterality: Left;   CHOLECYSTECTOMY     COLONOSCOPY WITH PROPOFOL N/A 08/04/2015   Colitis in remission.  No polyps.  Procedure: COLONOSCOPY WITH PROPOFOL;  Surgeon: Malissa Hippo, MD;  Location: [facility];  Service: Endoscopy;  Laterality: N/A;  cecum time in  0820   time out  0827    total time 7 minutes   COLONOSCOPY WITH PROPOFOL N/A 06/11/2019   cecal and ascending colon colitis.  Procedure: COLONOSCOPY WITH PROPOFOL;  Surgeon: Malissa Hippo, MD;  [facility];  Service: Endoscopy;  Laterality: N/A;  730a   COLONOSCOPY WITH PROPOFOL N/A 09/15/2020   Procedure: COLONOSCOPY WITH PROPOFOL;  Surgeon: Dolores Frame, MD;  [facility];  Service: Gastroenterology;  Laterality: N/A;  1030   ESOPHAGEAL DILATION N/A 08/04/2015   Procedure: ESOPHAGEAL DILATION;  Surgeon: Malissa Hippo, MD;  Location: [facility];  Service: Endoscopy;  Laterality: N/ALauralee Evener, no mucousal disruption   ESOPHAGOGASTRODUODENOSCOPY  09/27/2019   Performed for IDA.  Esoph dilation was done but no stricture present.  Mild portal hypertensive gastropathy, o/w normal.  Duodenal bx NEG.  h pylori neg.   ESOPHAGOGASTRODUODENOSCOPY (EGD) WITH ESOPHAGEAL DILATION  12/02/2005   ESOPHAGOGASTRODUODENOSCOPY (EGD) WITH PROPOFOL N/A 08/04/2015   Procedure: ESOPHAGOGASTRODUODENOSCOPY (EGD) WITH PROPOFOL;  Surgeon: Malissa Hippo, MD;  [facility]: [facility];  Service: Endoscopy;  Laterality: N/A;  procedure 1   ESOPHAGOGASTRODUODENOSCOPY (EGD) WITH PROPOFOL N/A 09/27/2019   Procedure: ESOPHAGOGASTRODUODENOSCOPY (EGD) WITH PROPOFOL;  Surgeon: Malissa Hippo, MD;  [facility];  Service: Endoscopy;  Laterality: N/A;  12:10    ESOPHAGOGASTRODUODENOSCOPY (EGD) WITH PROPOFOL N/A 10/31/2021   Procedure: ESOPHAGOGASTRODUODENOSCOPY (EGD) WITH PROPOFOL;  Surgeon: Malissa Hippo, MD;  [facility];  Service: Endoscopy;  Laterality: N/A;  930   FLEXIBLE SIGMOIDOSCOPY  01/17/2012   Procedure: FLEXIBLE SIGMOIDOSCOPY;  Surgeon: Malissa Hippo, MD;  [facility];  Service: Endoscopy;  Laterality: N/A;   GIVENS CAPSULE STUDY N/A 08/03/2019   Procedure: GIVENS CAPSULE STUDY (performed for IDA)->some food debris in stomach and small amount of blood.  Surgeon: Malissa Hippo, MD;  [facility];  Service: Endoscopy;  Laterality: N/A;  730AM   HEMILAMINOTOMY LUMBAR SPINE Bilateral 09/07/1999   L4-5   KNEE ARTHROSCOPY Right 01/1999; 10/2000   LEFT ATRIAL APPENDAGE OCCLUSION N/A 01/23/2023   Procedure: LEFT ATRIAL APPENDAGE OCCLUSION;  Surgeon: [name], MD;  Location: [facility] INVASIVE CV LAB;  Service: Cardiovascular;  Laterality:  N/A;   LYSIS OF ADHESION  02/10/2001   MALONEY DILATION  09/27/2019   Procedure: Elease Hashimoto DILATION;  Surgeon: Malissa Hippo, MD;  [facility];  Service: Endoscopy;;   NASAL ENDOSCOPY WITH EPISTAXIS CONTROL Bilateral 01/25/2022   Procedure: NASAL ENDOSCOPY WITH EPISTAXIS CONTROL;  Surgeon: Osborn Coho, MD;  Location: [facility] OR;  Service: ENT;  Laterality: Bilateral;   NASAL SEPTOPLASTY W/ TURBINOPLASTY Bilateral 01/25/2022   Procedure: NASAL SEPTOPLASTY WITH TURBINATE REDUCTION;  Surgeon: Osborn Coho, MD;  Location: [facility];  Service: ENT;  Laterality: Bilateral;   RECTOCELE REPAIR  1990; 09/12/2006   TARSAL TUNNEL RELEASE  2002   TEE WITHOUT CARDIOVERSION N/A 01/23/2023   Procedure: TRANSESOPHAGEAL ECHOCARDIOGRAM;  Surgeon: [name], MD;  Location: [facility] INVASIVE CV LAB;  Service: Cardiovascular;  Laterality: N/A;   TRANSTHORACIC ECHOCARDIOGRAM  08/04/2015   EF 60-65%, normal wall motion, mild LVH, mild LA dilation, grd I DD.   TUMOR EXCISION Left  03/21/2003   dorsal 1st web space (hand)   URETEROLYSIS Right 02/10/2001    Family History: Family History  Problem Relation Age of Onset   Diabetes Mother    Hypertension Mother    Heart attack Father        Mid 71's   Heart disease Father    Lung disease Father        spot on lung; had lung surgery   Alcohol abuse Other    Hypertension Son    Diabetes Son    Emphysema Maternal Grandfather    Asthma Maternal Grandfather    Colon cancer Paternal Grandfather    Stomach cancer Paternal Grandmother    Neuropathy Neg Hx     Social History: Social History   Tobacco Use  Smoking Status Never  Smokeless Tobacco Never   Social History   Substance and Sexual Activity  Alcohol Use Not Currently   Comment: ocassionally   Social History   Substance and Sexual Activity  Drug Use No    Allergies: Allergies  Allergen Reactions   Omeprazole Anaphylaxis and Swelling    SWELLING OF TONGUE AND THROAT   Actos [Pioglitazone] Swelling and Other (See Comments)    Weight gain, tongue swelling    Benzocaine-Menthol Swelling    SWELLING OF MOUTH   Colesevelam Other (See Comments)    GI UPSET   Flagyl [Metronidazole Hcl] Other (See Comments)    DIAPHORESIS   Metformin And Related Diarrhea   Shrimp [Shellfish Allergy] Itching    OF THROAT AND EARS, if consumed raw   Statins Palpitations   Allevyn Adhesive [Wound Dressings]     Other reaction(s): Unknown   Desipramine Hcl Itching, Nausea Only and Other (See Comments)    "swimmy" headed, ears itched    Hydromorphone Itching   Jardiance [Empagliflozin] Other (See Comments)    weakness   Lactose Intolerance (Gi) Diarrhea    Gas, bloating   Adhesive [Tape] Other (See Comments)    SKIN IRRITATION AND BRUISING   Nisoldipine Itching   Percocet [Oxycodone-Acetaminophen] Itching    Medications: Current Outpatient Medications  Medication Sig Dispense Refill   Accu-Chek Softclix Lancets lancets USE TO CHECK BLOOD GLUCOSE UP  TO 6 TIMES DAILY AS DIRECTED 200 each 5   acetaminophen (TYLENOL) 325 MG tablet Take 650 mg by mouth every 6 (six) hours as needed for moderate pain or headache.     amoxicillin (AMOXIL) 500 MG tablet Take 4 tablets (2,000 mg total) by mouth as directed. 1 HOUR PRIOR TO DENTAL  APPOINTMENTS 12 tablet 6   blood glucose meter kit and supplies KIT Use up to six times daily as directed. DX. E11.9 1 each 0   canagliflozin (INVOKANA) 100 MG TABS tablet TAKE 1 TABLET BY MOUTH EVERY DAY BEFORE BREAKFAST 90 tablet 1   citalopram (CELEXA) 20 MG tablet TAKE 1 TABLET BY MOUTH EVERY DAY 90 tablet 1   clopidogrel (PLAVIX) 75 MG tablet Take 1 tablet (75 mg total) by mouth daily. START ON 6/3 90 tablet 1   dicyclomine (BENTYL) 10 MG capsule TAKE 1 CAPSULE (10 MG TOTAL) BY MOUTH EVERY 8 (EIGHT) HOURS AS NEEDED FOR SPASMS (ABDOMINAL PAIN). 270 capsule 1   diphenoxylate-atropine (LOMOTIL) 2.5-0.025 MG tablet Take 1 tablet by mouth 4 (four) times daily as needed for diarrhea or loose stools. 60 tablet 2   Dulaglutide (TRULICITY) 1.5 MG/0.5ML SOPN INJECT 1.5MG  ONCE WEEKLY 18 mL 3   esomeprazole (NEXIUM) 20 MG capsule TAKE 1 CAPSULE BY MOUTH DAILY BEFORE BREAKFAST 90 capsule 3   ferric carboxymaltose (INJECTAFER) 750 MG/15ML SOLN injection Inject 15 mLs (750 mg total) into the vein as directed for 1 dose. Repeat in 7 days 15 mL 1   furosemide (LASIX) 40 MG tablet Take 1 tablet (40 mg total) by mouth daily. 90 tablet 1   gabapentin (NEURONTIN) 600 MG tablet TAKE 1 AND 1/2 TABLETS BY MOUTH TWICE A DAY (Patient taking differently: Take 900 mg by mouth 2 (two) times daily. TAKE 1 AND 1/2 TABLETS BY MOUTH TWICE A DAY) 270 tablet 1   glucose blood (ACCU-CHEK GUIDE) test strip USE TO CHECK BLOOD GLUCOSE UP TO 6 TIMES DAILY AS DIRECTED 600 strip 3   INFLIXIMAB IV Inject into the vein every 8 (eight) weeks. Remicaid 5mg /kg Infusion every 8 weeks [facility])     Insulin Pen Needle (B-D UF III MINI PEN NEEDLES) 31G X 5  MM MISC USE TO INJECT INSULINS EQUAL TO 6 TIMES DAILY. 500 each 6   insulin regular human CONCENTRATED (HUMULIN R U-500 KWIKPEN) 500 UNIT/ML KwikPen Inject 100 Units into the skin 3 (three) times daily with meals. Pt stated 100 units for breakfast lunch and dinner 48 mL 0   lactase (LACTAID) 3000 UNITS tablet Take 3,000 Units by mouth as needed (when eating foods containing dairy).      meclizine (ANTIVERT) 25 MG tablet Take 1 tablet (25 mg total) by mouth 3 (three) times daily as needed for dizziness or nausea. 90 tablet 0   metoprolol succinate (TOPROL-XL) 100 MG 24 hr tablet TAKE 1 TABLET BY MOUTH 2 TIMES DAILY. TAKE WITH OR IMMEDIATELY FOLLOWING A MEAL 180 tablet 1   potassium chloride (KLOR-CON M10) 10 MEQ tablet TAKE 1 TABLET BY MOUTH EVERY DAY 30 tablet 0   No current facility-administered medications for this visit.    Review of Systems: GENERAL: negative for malaise, night sweats HEENT: No changes in hearing or vision, no nose bleeds or other nasal problems. NECK: Negative for lumps, goiter, pain and significant neck swelling RESPIRATORY: Negative for cough, wheezing CARDIOVASCULAR: Negative for chest pain, leg swelling, palpitations, orthopnea GI: SEE HPI MUSCULOSKELETAL: Negative for joint pain or swelling, back pain, and muscle pain. SKIN: Negative for lesions, rash PSYCH: Negative for sleep disturbance, mood disorder and recent psychosocial stressors. HEMATOLOGY Negative for prolonged bleeding, bruising easily, and swollen nodes. ENDOCRINE: Negative for cold or heat intolerance, polyuria, polydipsia and goiter. NEURO: negative for tremor, gait imbalance, syncope and seizures. The remainder of the review of systems is noncontributory.  Physical Exam: BP (!) 117/54 (BP Location: Left Arm, Patient Position: Sitting, Cuff Size: Normal)   Pulse 74   Temp 97.8 F (36.6 C) (Temporal)   Ht 5\' 2"  (1.575 m)   Wt 233 lb 11.2 oz (106 kg)   BMI 42.74 kg/m  GENERAL: The patient is  AO x3, in no acute distress. HEENT: Head is normocephalic and atraumatic. EOMI are intact. Mouth is well hydrated and without lesions. NECK: Supple. No masses LUNGS: Clear to auscultation. No presence of rhonchi/wheezing/rales. Adequate chest expansion HEART: RRR, normal s1 and s2. ABDOMEN: Soft, nontender, no guarding, no peritoneal signs, and nondistended. BS +. No masses. EXTREMITIES: Without any cyanosis, clubbing, rash, lesions or edema. NEUROLOGIC: AOx3, no focal motor deficit. SKIN: no jaundice, no rashes  Imaging/Labs: as above  I personally reviewed and interpreted the available labs, imaging and endoscopic files.  Impression and Plan: [name] is a 77 y.o. female with past medical history of ulcerative colitis on infliximab, NASH cirrhosis, atrial fibrillation status post watchman placement, GERD, anxiety, hyperlipidemia, history of iron deficiency anemia, diabetes, neuropathy, OSA, IBS, fibromyalgia, who presents for follow up of ulcerative colitis and cirrhosis.  Regarding her ulcerative colitis, the patient has presented some intermittent symptoms of urgency and occasional episodes of diarrhea.  No red flag signs and overall her symptoms are well-controlled.  We discussed in detail that we will need to wait until she has received 4 doses of Remicade 10 mg/kg to check her repeat infliximab levels prior to the fifth dose.  For now we will continue with same dosage, she can continue using Lomotil as needed if she has significant diarrhea.  Will check repeat CRP and ESR today.  Regarding her liver cirrhosis, she has presented compensated liver disease with a low MELD score.  Will repeat MELD labs and AFP today.  She is also due for [facility] screening with right upper quadrant ultrasound which will be ordered today.  We discussed about the importance of implementing a Mediterranean diet and decreasing carbohydrate intake to achieve further weight loss.  She was also advised to increase  the intake of protein at night to avoid prolonged fasting intervals.  She is due for repeat EGD based on previous recommendations from Dr. Karilyn Cota -this will be discussed at next appointment as depending on findings we may need to schedule these at the same time of colonoscopy.  -Continue Remicade 10 mg /kg every 8 weeks -Perform blood workup -Will order infliximab levels next appointment  -Schedule liver US -Will order repeat EGD in next appointment -Reduce salt intake to <2 g per day -Can take Tylenol max of 2 g per day (650 mg q8h) for pain -Avoid NSAIDs for pain -Avoid eating raw oysters/shellfish -Protein shake (Ensure) every night before going to sleep -Implement a Mediterranean diet   All questions were answered.      Katrinka Blazing, MD Gastroenterology and Hepatology [facility] Gastroenterology

## 2023-05-12 ENCOUNTER — Other Ambulatory Visit (INDEPENDENT_AMBULATORY_CARE_PROVIDER_SITE_OTHER): Payer: Self-pay | Admitting: Gastroenterology

## 2023-05-12 ENCOUNTER — Telehealth (INDEPENDENT_AMBULATORY_CARE_PROVIDER_SITE_OTHER): Payer: Self-pay

## 2023-05-12 ENCOUNTER — Encounter: Payer: Self-pay | Admitting: *Deleted

## 2023-05-12 ENCOUNTER — Other Ambulatory Visit: Payer: Self-pay | Admitting: Family Medicine

## 2023-05-12 DIAGNOSIS — D649 Anemia, unspecified: Secondary | ICD-10-CM

## 2023-05-12 DIAGNOSIS — D509 Iron deficiency anemia, unspecified: Secondary | ICD-10-CM

## 2023-05-12 NOTE — Telephone Encounter (Signed)
[facility] called to report a critical lab, they say it has been sent to the electronic medical records. If questions call [phone] [id number] [phone]. Please advise if you received.

## 2023-05-12 NOTE — Telephone Encounter (Signed)
Spoke to patient about her lab results showing worsening hemoglobin.  Will repeat a CBC and iron stores in 10 days (orders placed).  I also advised the patient that her glucose was very elevated but she reported that she has checked her fingersticks recently and it has fluctuated in the 110s range.  She will keep an eye on this.

## 2023-05-12 NOTE — Telephone Encounter (Signed)
Orders mailed to the patient at Doctor Castaneda's request.

## 2023-05-23 ENCOUNTER — Ambulatory Visit (HOSPITAL_COMMUNITY): Admission: RE | Admit: 2023-05-23 | Payer: Medicare PPO | Source: Ambulatory Visit

## 2023-05-23 DIAGNOSIS — K7460 Unspecified cirrhosis of liver: Secondary | ICD-10-CM | POA: Insufficient documentation

## 2023-05-23 DIAGNOSIS — K7689 Other specified diseases of liver: Secondary | ICD-10-CM | POA: Diagnosis not present

## 2023-05-27 NOTE — Patient Instructions (Signed)

## 2023-05-28 ENCOUNTER — Encounter (INDEPENDENT_AMBULATORY_CARE_PROVIDER_SITE_OTHER): Payer: Self-pay

## 2023-05-28 DIAGNOSIS — H25813 Combined forms of age-related cataract, bilateral: Secondary | ICD-10-CM | POA: Diagnosis not present

## 2023-05-28 DIAGNOSIS — H1045 Other chronic allergic conjunctivitis: Secondary | ICD-10-CM | POA: Diagnosis not present

## 2023-05-28 DIAGNOSIS — G4730 Sleep apnea, unspecified: Secondary | ICD-10-CM | POA: Diagnosis not present

## 2023-05-28 DIAGNOSIS — E119 Type 2 diabetes mellitus without complications: Secondary | ICD-10-CM | POA: Diagnosis not present

## 2023-05-28 DIAGNOSIS — H04123 Dry eye syndrome of bilateral lacrimal glands: Secondary | ICD-10-CM | POA: Diagnosis not present

## 2023-05-28 DIAGNOSIS — H43811 Vitreous degeneration, right eye: Secondary | ICD-10-CM | POA: Diagnosis not present

## 2023-05-28 LAB — HM DIABETES EYE EXAM

## 2023-05-29 ENCOUNTER — Ambulatory Visit: Payer: Medicare PPO | Admitting: Family Medicine

## 2023-05-29 ENCOUNTER — Encounter: Payer: Self-pay | Admitting: Family Medicine

## 2023-05-29 VITALS — BP 101/58 | HR 73 | Temp 98.2°F | Ht 62.0 in | Wt 232.0 lb

## 2023-05-29 DIAGNOSIS — D500 Iron deficiency anemia secondary to blood loss (chronic): Secondary | ICD-10-CM | POA: Diagnosis not present

## 2023-05-29 DIAGNOSIS — E1142 Type 2 diabetes mellitus with diabetic polyneuropathy: Secondary | ICD-10-CM

## 2023-05-29 DIAGNOSIS — D649 Anemia, unspecified: Secondary | ICD-10-CM

## 2023-05-29 DIAGNOSIS — R270 Ataxia, unspecified: Secondary | ICD-10-CM

## 2023-05-29 DIAGNOSIS — I10 Essential (primary) hypertension: Secondary | ICD-10-CM | POA: Diagnosis not present

## 2023-05-29 DIAGNOSIS — M7918 Myalgia, other site: Secondary | ICD-10-CM

## 2023-05-29 DIAGNOSIS — W19XXXA Unspecified fall, initial encounter: Secondary | ICD-10-CM

## 2023-05-29 DIAGNOSIS — E119 Type 2 diabetes mellitus without complications: Secondary | ICD-10-CM | POA: Diagnosis not present

## 2023-05-29 DIAGNOSIS — Z794 Long term (current) use of insulin: Secondary | ICD-10-CM

## 2023-05-29 DIAGNOSIS — G3184 Mild cognitive impairment, so stated: Secondary | ICD-10-CM

## 2023-05-29 LAB — POCT GLYCOSYLATED HEMOGLOBIN (HGB A1C)
HbA1c POC (<> result, manual entry): 8.5 % (ref 4.0–5.6)
HbA1c, POC (controlled diabetic range): 8.5 % — AB (ref 0.0–7.0)
HbA1c, POC (prediabetic range): 8.5 % — AB (ref 5.7–6.4)
Hemoglobin A1C: 8.5 % — AB (ref 4.0–5.6)

## 2023-05-29 MED ORDER — CITALOPRAM HYDROBROMIDE 20 MG PO TABS: 20.0000 mg | ORAL_TABLET | Freq: Every day | ORAL | 1 refills | Status: DC

## 2023-05-29 MED ORDER — CANAGLIFLOZIN 100 MG PO TABS: 100.0000 mg | ORAL_TABLET | Freq: Every day | ORAL | 1 refills | Status: DC

## 2023-05-29 MED ORDER — POTASSIUM CHLORIDE CRYS ER 10 MEQ PO TBCR: 10.0000 meq | EXTENDED_RELEASE_TABLET | Freq: Every day | ORAL | 1 refills | Status: DC

## 2023-05-29 MED ORDER — METOPROLOL SUCCINATE ER 100 MG PO TB24: ORAL_TABLET | ORAL | 1 refills | Status: DC

## 2023-05-29 MED ORDER — FUROSEMIDE 40 MG PO TABS: 40.0000 mg | ORAL_TABLET | Freq: Every day | ORAL | 1 refills | Status: DC

## 2023-05-29 NOTE — Progress Notes (Signed)
OFFICE VISIT  05/29/2023  CC:  Chief Complaint  Patient presents with   Medical Management of Chronic Issues    Not fasting; Larey Seat about 2wks ago hurt, left hip pain, using tylenol, able to get after falling by herself. Pain level (8)    Patient is a 77 y.o. female who presents for 40-month follow-up diabetes, hypertension, and IDA. A/P as of last visit: "#1 diabetes with peripheral neuropathy. POC Hba1c today is 7.6%. Continue Trulicity1.5mg  SQ q week, Invokana, and U-500 (100 U qAM and 100 U lunch, none at supper or bedtime)   #2 hypertension, well-controlled on Toprol-XL 100 mg a day.   3. Onychogryphosis, L great toenail. Nipped today."  INTERIM HX: Several months ago started having short term memory loss and word finding probs.  Feeling like she can't walk balanced.  Larey Seat just walking out her front door a couple weeks ago.  Says fell onto L hip/side.  Says she did not hit her head.  Son told her she did not pick her feet up enough.  She finds it hard to describe but says she feels off balance.  Says she can feel her feet just fine.  Denies vertigo.  She does not exactly endorse dizziness but does admit she has been taking some meclizine. Denies headache, vision abnormality, hearing abnormality, focal weakness, swallowing difficulty, slurred speech, depressed mood, or tingling or numbness anywhere.  No tremor. Since the fall she has been hurting in her buttocks region diffusely.  No lateral or anterior hip pain.  She walks fairly slowly with a cane.  She got Watchman device implanted at the end of April of this year.  She stayed on Xarelto about 6 weeks after that and then switched to Plavix. As of 05/06/23 cardiology f/u-->AFTER STOPPING PLAVIX IN OCTOBER-START ASPIRIN 81 MG ONCE DAILY .    Hemoglobin dropped to 8.8, MCV 85 on labs by gastroenterology on 05/08/2023.  They wanted to repeat this about 10 days later but it has not been done.  She estimates her most recent iron infusion was  about 2 months ago.  She gets these through her rheumatologist. She denies any recent problems with nosebleeds.  No hematuria, no melena, no hematochezia, no excessive bruising.  Glucoses have been erratic as per her usual.  No hypoglycemia. Average glucose estimated to be around 175. Says home blood pressures have been in the low 100s over the 60s.  ROS as above, plus--> no fevers, no CP, no SOB, no wheezing, no cough, no rashes.  No polyuria or polydipsia.    No dysuria or unusual/new urinary urgency or frequency.  No recent changes in lower legs. No n/v/d or abd pain.  No palpitations.    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 ? to get liver bx if she gets bariatric surgery? Mild portal hypertensive gastropathy on EGD 08/2019.   Fibromyalgia    GAD (generalized anxiety disorder)    GERD    History of hiatal hernia    History of iron deficiency anemia 12/2018   Inadequate absorption secondary to chronic/long term PPI therapy + portal hypertensive gastroduodenopathy. No GIB found on EGD, colonosc, and givens. Iron infusions X multiple.   History of thrombocytopenia 12/2011   Hyperlipidemia    Intolerant of statins   HYPERTENSION    IBS (irritable bowel syndrome)    -D.  Good response to bentyl and imodium as of 06/2018 GI f/u.   IDDM (insulin dependent diabetes mellitus)    with DPN (managed by Dr. Elvera Lennox but then in 2018 pt preferred to have me manage for her convenience)   Limited mobility    Requires a walker for arthritic pain, widespread musculoskeletal pain, and neuropathic pain.   Morbid obesity (HCC)    As of 11/2018, pt considering sleeve gastectomy vs bipass as of eval by Dr. Alvino Blood considering as of 12/2019.   Nonalcoholic steatohepatitis    Viral Hep screens NEG.  CT 2015.  Transaminasemia.  U/S 07/2018 showed  early changes of cirrhosis.   OSA (obstructive sleep apnea) 09/14/2015   sleep study 09/07/15: severe obstructive sleep apnea with an AHI of 72 and SaO2 low of 75%.>referred to sleep MD   Osteoarthritis    hips, shoulders, knees   PAF (paroxysmal atrial fibrillation) (HCC)    One documented episode (after getting EGD 2016).  Was on amiodarone x 3 mo.  Rate control with metoprolol + anticoag with xarelto. Watchman 12/2022, off anticoag   Recurrent epistaxis    Granuloma in L nare cauterized by ENT 04/2020. Another cautery 06/2020   Small fiber neuropathy    Due to DM.  Symmetric hands and feet tingling/numbness.   Ulcerative colitis (HCC)    Remicade infusion Q 8 weeks: in clinical and endoscopic remission as of 12/2018 GI f/u.  06/11/19 rpt colonoscopy->cecal and ascending colon colitis.    Past Surgical History:  Procedure Laterality Date   ABDOMINAL HYSTERECTOMY  1980   Paps no longer indicated.   BACK SURGERY     BACTERIAL OVERGROWTH TEST N/A 07/13/2015   Procedure: BACTERIAL OVERGROWTH TEST;  Surgeon: Malissa Hippo, MD;  [facility]: [facility];  Service: Endoscopy;  Laterality: N/A;  730     BILATERAL SALPINGOOPHORECTOMY  02/10/2001   BIOPSY  06/11/2019   Procedure: BIOPSY;  Surgeon: Malissa Hippo, MD;  [facility]: [facility];  Service: Endoscopy;;  colon   BIOPSY  09/27/2019   Procedure: BIOPSY;  Surgeon: Malissa Hippo, MD;  [facility]: [facility];  Service: Endoscopy;;  gastric duodenal   BIOPSY  09/15/2020   Procedure: BIOPSY;  Surgeon: Dolores Frame, MD;  [facility]: [facility];  Service: Gastroenterology;;   BREAST REDUCTION SURGERY  1994   bilat   BREAST SURGERY     CARDIOVASCULAR STRESS TEST  07/2010   Lexiscan myoview: normal   CARPAL TUNNEL RELEASE Right 1996   CARPAL TUNNEL RELEASE Left 03/21/2003   CARPAL TUNNEL RELEASE Right 05/04/2013   Procedure: CARPAL TUNNEL RELEASE;  Surgeon: Wyn Forster., MD;  Location: [facility];   Service: Orthopedics;  Laterality: Right;   CARPAL TUNNEL RELEASE Left 09/21/2013   Procedure: LEFT CARPAL TUNNEL RELEASE;  Surgeon: Wyn Forster., MD;  Location: [facility];  Service: Orthopedics;  Laterality: Left;   CHOLECYSTECTOMY     COLONOSCOPY WITH PROPOFOL N/A 08/04/2015   Colitis in remission.  No polyps.  Procedure: COLONOSCOPY WITH PROPOFOL;  Surgeon: Malissa Hippo, MD;  Location: [facility];  Service: Endoscopy;  Laterality: N/A;  cecum time in  0820   time out  0827    total time 7 minutes   COLONOSCOPY WITH PROPOFOL N/A 06/11/2019   cecal and ascending colon colitis.  Procedure: COLONOSCOPY WITH PROPOFOL;  Surgeon: Malissa Hippo, MD;  [facility];  Service: Endoscopy;  Laterality: N/A;  130Q  COLONOSCOPY WITH PROPOFOL N/A 09/15/2020   Procedure: COLONOSCOPY WITH PROPOFOL;  Surgeon: Dolores Frame, MD;  [facility];  Service: Gastroenterology;  Laterality: N/A;  1030   ESOPHAGEAL DILATION N/A 08/04/2015   Procedure: ESOPHAGEAL DILATION;  Surgeon: Malissa Hippo, MD;  Location: [facility];  Service: Endoscopy;  Laterality: N/ALauralee Evener, no mucousal disruption   ESOPHAGOGASTRODUODENOSCOPY  09/27/2019   Performed for IDA.  Esoph dilation was done but no stricture present.  Mild portal hypertensive gastropathy, o/w normal.  Duodenal bx NEG.  h pylori neg.   ESOPHAGOGASTRODUODENOSCOPY (EGD) WITH ESOPHAGEAL DILATION  12/02/2005   ESOPHAGOGASTRODUODENOSCOPY (EGD) WITH PROPOFOL N/A 08/04/2015   Procedure: ESOPHAGOGASTRODUODENOSCOPY (EGD) WITH PROPOFOL;  Surgeon: Malissa Hippo, MD;  Location: [facility];  Service: Endoscopy;  Laterality: N/A;  procedure 1   ESOPHAGOGASTRODUODENOSCOPY (EGD) WITH PROPOFOL N/A 09/27/2019   Procedure: ESOPHAGOGASTRODUODENOSCOPY (EGD) WITH PROPOFOL;  Surgeon: Malissa Hippo, MD;  [facility];  Service: Endoscopy;  Laterality: N/A;  12:10   ESOPHAGOGASTRODUODENOSCOPY (EGD) WITH PROPOFOL N/A  10/31/2021   Procedure: ESOPHAGOGASTRODUODENOSCOPY (EGD) WITH PROPOFOL;  Surgeon: Malissa Hippo, MD;  [facility];  Service: Endoscopy;  Laterality: N/A;  930   FLEXIBLE SIGMOIDOSCOPY  01/17/2012   Procedure: FLEXIBLE SIGMOIDOSCOPY;  Surgeon: Malissa Hippo, MD;  [facility];  Service: Endoscopy;  Laterality: N/A;   GIVENS CAPSULE STUDY N/A 08/03/2019   Procedure: GIVENS CAPSULE STUDY (performed for IDA)->some food debris in stomach and small amount of blood.  Surgeon: Malissa Hippo, MD;  [facility]: [facility];  Service: Endoscopy;  Laterality: N/A;  730AM   HEMILAMINOTOMY LUMBAR SPINE Bilateral 09/07/1999   L4-5   KNEE ARTHROSCOPY Right 01/1999; 10/2000   LEFT ATRIAL APPENDAGE OCCLUSION N/A 01/23/2023   Procedure: LEFT ATRIAL APPENDAGE OCCLUSION;  Surgeon: [name], MD;  Location: [facility] INVAS[facility] CV [facility];  Service: Cardiovascular;  Laterality: N/A;   LYSIS OF ADHESION  02/10/2001   MALONEY DILATION  09/27/2019   Procedure: Elease Hashimoto DILATION;  Surgeon: Malissa Hippo, MD;  [facility];  Service: Endoscopy;;   NASAL ENDOSCOPY WITH EPISTAXIS CONTROL Bilateral 01/25/2022   Procedure: NASAL ENDOSCOPY WITH EPISTAXIS CONTROL;  Surgeon: Osborn Coho, MD;  Location[facility];  Service: ENT;  Laterality: Bilateral;   NASAL SEPTOPLASTY W/ TURBINOPLASTY Bilateral 01/25/2022   Procedure: NASAL SEPTOPLASTY WITH TURBINATE REDUCTION;  Surgeon: Osborn Coho, MD;  Location: [facility] OR;  Service: ENT;  Laterality: Bilateral;   RECTOCELE REPAIR  1990; 09/12/2006   TARSAL TUNNEL RELEASE  2002   TEE WITHOUT CARDIOVERSION N/A 01/23/2023   Procedure: TRANSESOPHAGEAL ECHOCARDIOGRAM;  Surgeon: [name], MD;  Location: [facility] INVASIVE CV LAB;  Service: Cardiovascular;  Laterality: N/A;   TRANSTHORACIC ECHOCARDIOGRAM  08/04/2015   EF 60-65%, normal wall motion, mild LVH, mild LA dilation, grd I DD.   TUMOR EXCISION Left 03/21/2003   dorsal 1st web space (hand)    URETEROLYSIS Right 02/10/2001    Outpatient Medications Prior to Visit  Medication Sig Dispense Refill   Accu-Chek Softclix Lancets lancets USE TO CHECK BLOOD GLUCOSE UP TO 6 TIMES DAILY AS DIRECTED 200 each 5   acetaminophen (TYLENOL) 325 MG tablet Take 650 mg by mouth every 6 (six) hours as needed for moderate pain or headache.     blood glucose meter kit and supplies KIT Use up to six times daily as directed. DX. E11.9 1 each 0   clopidogrel (PLAVIX) 75 MG tablet Take 1 tablet (75  mg total) by mouth daily. START ON 6/3 90 tablet 1   dicyclomine (BENTYL) 10 MG capsule TAKE 1 CAPSULE (10 MG TOTAL) BY MOUTH EVERY 8 (EIGHT) HOURS AS NEEDED FOR SPASMS (ABDOMINAL PAIN). 270 capsule 1   diphenoxylate-atropine (LOMOTIL) 2.5-0.025 MG tablet Take 1 tablet by mouth 4 (four) times daily as needed for diarrhea or loose stools. 60 tablet 2   Dulaglutide (TRULICITY) 1.5 MG/0.5ML SOPN INJECT 1.5MG  ONCE WEEKLY 18 mL 3   esomeprazole (NEXIUM) 20 MG capsule TAKE 1 CAPSULE BY MOUTH DAILY BEFORE BREAKFAST 90 capsule 3   ferric carboxymaltose (INJECTAFER) 750 MG/15ML SOLN injection Inject 15 mLs (750 mg total) into the vein as directed for 1 dose. Repeat in 7 days 15 mL 1   gabapentin (NEURONTIN) 600 MG tablet TAKE 1 AND 1/2 TABLETS BY MOUTH TWICE A DAY. MUST KEEP APPT FOR FURTHER REFILLS 90 tablet 0   glucose blood (ACCU-CHEK GUIDE) test strip USE TO CHECK BLOOD GLUCOSE UP TO 6 TIMES DAILY AS DIRECTED 600 strip 3   INFLIXIMAB IV Inject into the vein every 8 (eight) weeks. Remicaid 5mg /kg Infusion every 8 weeks [facility])     Insulin Pen Needle (B-D UF III MINI PEN NEEDLES) 31G X 5 MM MISC USE TO INJECT INSULINS EQUAL TO 6 TIMES DAILY. 500 each 6   insulin regular human CONCENTRATED (HUMULIN R U-500 KWIKPEN) 500 UNIT/ML KwikPen Inject 100 Units into the skin 3 (three) times daily with meals. Pt stated 100 units for breakfast lunch and dinner 48 mL 0   lactase (LACTAID) 3000 UNITS tablet Take 3,000  Units by mouth as needed (when eating foods containing dairy).      meclizine (ANTIVERT) 25 MG tablet Take 1 tablet (25 mg total) by mouth 3 (three) times daily as needed for dizziness or nausea. 90 tablet 0   amoxicillin (AMOXIL) 500 MG tablet Take 4 tablets (2,000 mg total) by mouth as directed. 1 HOUR PRIOR TO DENTAL APPOINTMENTS (Patient not taking: Reported on 05/29/2023) 12 tablet 6   canagliflozin (INVOKANA) 100 MG TABS tablet TAKE 1 TABLET BY MOUTH EVERY DAY BEFORE BREAKFAST 90 tablet 1   citalopram (CELEXA) 20 MG tablet TAKE 1 TABLET BY MOUTH EVERY DAY 90 tablet 1   furosemide (LASIX) 40 MG tablet Take 1 tablet (40 mg total) by mouth daily. 90 tablet 1   metoprolol succinate (TOPROL-XL) 100 MG 24 hr tablet TAKE 1 TABLET BY MOUTH 2 TIMES DAILY. TAKE WITH OR IMMEDIATELY FOLLOWING A MEAL 180 tablet 1   potassium chloride (KLOR-CON M10) 10 MEQ tablet TAKE 1 TABLET BY MOUTH EVERY DAY 30 tablet 0   No facility-administered medications prior to visit.    Allergies  Allergen Reactions   Omeprazole Anaphylaxis and Swelling    SWELLING OF TONGUE AND THROAT   Actos [Pioglitazone] Swelling and Other (See Comments)    Weight gain, tongue swelling    Benzocaine-Menthol Swelling    SWELLING OF MOUTH   Colesevelam Other (See Comments)    GI UPSET   Flagyl [Metronidazole Hcl] Other (See Comments)    DIAPHORESIS   Metformin And Related Diarrhea   Shrimp [Shellfish Allergy] Itching    OF THROAT AND EARS, if consumed raw   Statins Palpitations   Allevyn Adhesive [Wound Dressings]     Other reaction(s): Unknown   Desipramine Hcl Itching, Nausea Only and Other (See Comments)    "swimmy" headed, ears itched    Hydromorphone Itching   Jardiance [Empagliflozin] Other (See Comments)  weakness   Lactose Intolerance (Gi) Diarrhea    Gas, bloating   Adhesive [Tape] Other (See Comments)    SKIN IRRITATION AND BRUISING   Nisoldipine Itching   Percocet [Oxycodone-Acetaminophen] Itching     Review of Systems As per HPI  PE:    05/29/2023    1:05 PM 05/08/2023    2:49 PM 05/06/2023    1:51 PM  Vitals with BMI  Height 5\' 2"  5\' 2"  5\' 2"   Weight 232 lbs 233 lbs 11 oz 234 lbs 13 oz  BMI 42.42 42.73 42.93  Systolic 101 117 409  Diastolic 58 54 64  Pulse 73 74 68     Physical Exam  Gen: Alert, well appearing.  Patient is oriented to person, place, time, and situation. AFFECT: pleasant, speech is clear but she has word finding difficulty and hesitation fairly regularly. She is oriented to person place time and situation. Neuro: CN 2-12 intact bilaterally, strength 5/5 in proximal and distal upper extremities and lower extremities bilaterally.  No tremor.  No disdiadochokinesis.  She ambulates slowly and with a wide-based gait using her cane.  .  No pronator drift.  LABS:  Last CBC Lab Results  Component Value Date   WBC 4.4 05/08/2023   HGB 8.8 (L) 05/08/2023   HCT 30.2 (L) 05/08/2023   MCV 84.8 05/08/2023   MCH 24.7 (L) 05/08/2023   RDW 15.2 (H) 05/08/2023   PLT 141 05/08/2023   Lab Results  Component Value Date   IRON 129 08/28/2022   TIBC 243 (L) 08/28/2022   FERRITIN 333 (H) 08/28/2022   Last metabolic panel Lab Results  Component Value Date   GLUCOSE 481 (H) 05/08/2023   NA 136 05/08/2023   K 4.0 05/08/2023   CL 100 05/08/2023   CO2 25 05/08/2023   BUN 13 05/08/2023   CREATININE 0.90 05/08/2023   EGFR 90 02/25/2023   CALCIUM 8.4 (L) 05/08/2023   PROT 6.9 05/08/2023   ALBUMIN 3.3 (A) 02/25/2023   LABGLOB 3.4 08/17/2015   AGRATIO 1.0 08/17/2015   BILITOT 0.7 05/08/2023   ALKPHOS 108 02/25/2023   AST 29 05/08/2023   ALT 15 05/08/2023   ANIONGAP 5 10/10/2022   Last lipids Lab Results  Component Value Date   CHOL 149 05/06/2023   HDL 38 (L) 05/06/2023   LDLCALC 89 05/06/2023   LDLDIRECT 69.5 01/24/2011   TRIG 123 05/06/2023   CHOLHDL 3.9 05/06/2023   Last hemoglobin A1c Lab Results  Component Value Date   HGBA1C 8.5 (A) 05/29/2023    HGBA1C 8.5 05/29/2023   HGBA1C 8.5 (A) 05/29/2023   HGBA1C 8.5 (A) 05/29/2023   Last thyroid functions Lab Results  Component Value Date   TSH 0.618 10/09/2022   Last vitamin B12 and Folate Lab Results  Component Value Date   VITAMINB12 >2,000 (H) 01/26/2019   IMPRESSION AND PLAN:  #1 diabetes with peripheral neuropathy (autonomic). POC Hba1c today is 8.5%.  This is possibly falsely elevated due to her iron deficiency anemia. Will get fructosamine level today. Continue Trulicity1.5mg  SQ q week, Invokana, and U-500 (100 U qAM and 100 U lunch, none at supper or bedtime).  #2 hypertension in the setting of diabetic autonomic neuropathy. Normal blood pressure.  Continue Toprol-XL 100 mg twice a day.  3.  Iron deficiency anemia, multifactorial: Blood loss due to epistaxis (now resolved s/p multiple cauterization procedures and now off xarelto), malabsoroption d/t chronic high dose PPI use and portal hypertensive gastropathy. Last Hb 8.8, MCV  85.  This is compared to 10.6 about 2 months prior.  WBCs and platelets normal. CBC, iron panel, B12, and folate today.  Will make her GI provider aware of these results.  #4 cognitive impairment with short-term memory loss plus unsteady gait (and at least one fall)-->all new in the last few months.  MRI brain without contrast ordered today.  #5 gluteal pain bilaterally.  She is status post fall 2 weeks ago.  Low suspicion of fracture.  Gradually improving. Monitor.  Okay to continue Tylenol 1000 mg twice daily as needed.  An After Visit Summary was printed and given to the patient.  FOLLOW UP: Return in about 2 weeks (around 06/12/2023) for routine chronic illness f/u. Next CPE 11/2023 Signed:  Santiago Bumpers, MD           05/29/2023

## 2023-05-30 LAB — CBC
HCT: 29.5 % — ABNORMAL LOW (ref 36.0–46.0)
Hemoglobin: 8.8 g/dL — ABNORMAL LOW (ref 12.0–15.0)
MCHC: 29.8 g/dL — ABNORMAL LOW (ref 30.0–36.0)
MCV: 79.8 fl (ref 78.0–100.0)
Platelets: 134 10*3/uL — ABNORMAL LOW (ref 150.0–400.0)
RBC: 3.69 Mil/uL — ABNORMAL LOW (ref 3.87–5.11)
RDW: 18 % — ABNORMAL HIGH (ref 11.5–15.5)
WBC: 3.7 10*3/uL — ABNORMAL LOW (ref 4.0–10.5)

## 2023-05-30 LAB — BASIC METABOLIC PANEL
BUN: 8 mg/dL (ref 6–23)
CO2: 27 meq/L (ref 19–32)
Calcium: 8.5 mg/dL (ref 8.4–10.5)
Chloride: 101 meq/L (ref 96–112)
Creatinine, Ser: 0.64 mg/dL (ref 0.40–1.20)
GFR: 86.23 mL/min (ref >60.00)
Glucose, Bld: 203 mg/dL — ABNORMAL HIGH (ref 70–99)
Potassium: 3.5 meq/L (ref 3.5–5.1)
Sodium: 139 meq/L (ref 135–145)

## 2023-05-30 LAB — B12 AND FOLATE PANEL
Folate: 18.4 ng/mL (ref >5.9)
Vitamin B-12: 246 pg/mL (ref 211–911)

## 2023-06-02 LAB — RETICULOCYTES
ABS Retic: 89750 {cells}/uL — ABNORMAL HIGH (ref 20000–80000)
Retic Ct Pct: 2.5 %

## 2023-06-02 LAB — FRUCTOSAMINE: Fructosamine: 370 umol/L — ABNORMAL HIGH (ref 205–285)

## 2023-06-02 LAB — IRON,TIBC AND FERRITIN PANEL
%SAT: 16 % (ref 16–45)
Ferritin: 7 ng/mL — ABNORMAL LOW (ref 16–288)
Iron: 57 ug/dL (ref 45–160)
TIBC: 358 ug/dL (ref 250–450)

## 2023-06-04 ENCOUNTER — Ambulatory Visit (HOSPITAL_COMMUNITY): Payer: Medicare PPO | Attending: Family Medicine

## 2023-06-05 ENCOUNTER — Other Ambulatory Visit: Payer: Self-pay

## 2023-06-05 DIAGNOSIS — E1140 Type 2 diabetes mellitus with diabetic neuropathy, unspecified: Secondary | ICD-10-CM

## 2023-06-05 MED ORDER — HUMULIN R U-500 KWIKPEN 500 UNIT/ML ~~LOC~~ SOPN: PEN_INJECTOR | SUBCUTANEOUS | 1 refills | Status: DC

## 2023-06-12 ENCOUNTER — Ambulatory Visit: Payer: Medicare PPO | Admitting: Family Medicine

## 2023-06-12 NOTE — Progress Notes (Deleted)
OFFICE VISIT  06/12/2023  CC: No chief complaint on file.   Patient is a 77 y.o. female who presents for 2-week follow-up iron-deficiency anemia, diabetes, and cognitive impairment with short-term memory loss. A/P as of last visit: "1 diabetes with peripheral neuropathy (autonomic). POC Hba1c today is 8.5%.  This is possibly falsely elevated due to her iron deficiency anemia. Will get fructosamine level today. Continue Trulicity1.5mg  SQ q week, Invokana, and U-500 (100 U qAM and 100 U lunch, none at supper or bedtime).   #2 hypertension in the setting of diabetic autonomic neuropathy. Normal blood pressure.  Continue Toprol-XL 100 mg twice a day.   3.  Iron deficiency anemia, multifactorial: Blood loss due to epistaxis (now resolved s/p multiple cauterization procedures and now off xarelto), malabsoroption d/t chronic high dose PPI use and portal hypertensive gastropathy. Last Hb 8.8, MCV 85.  This is compared to 10.6 about 2 months prior.  WBCs and platelets normal. CBC, iron panel, B12, and folate today.  Will make her GI provider aware of these results.   #4 cognitive impairment with short-term memory loss plus unsteady gait (and at least one fall)-->all new in the last few months.  MRI brain without contrast ordered today.   #5 gluteal pain bilaterally.  She is status post fall 2 weeks ago.  Low suspicion of fracture.  Gradually improving. Monitor.  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 ? to get liver bx if she gets bariatric surgery? Mild portal hypertensive gastropathy on EGD 08/2019.   Fibromyalgia    GAD (generalized anxiety disorder)    GERD    History of hiatal hernia    History of iron deficiency anemia 12/2018   Inadequate absorption  secondary to chronic/long term PPI therapy + portal hypertensive gastroduodenopathy. No GIB found on EGD, colonosc, and givens. Iron infusions X multiple.   History of thrombocytopenia 12/2011   Hyperlipidemia    Intolerant of statins   HYPERTENSION    IBS (irritable bowel syndrome)    -D.  Good response to bentyl and imodium as of 06/2018 GI f/u.   IDDM (insulin dependent diabetes mellitus)    with DPN (managed by Dr. Elvera Lennox but then in 2018 pt preferred to have me manage for her convenience)   Limited mobility    Requires a walker for arthritic pain, widespread musculoskeletal pain, and neuropathic pain.   Morbid obesity (HCC)    As of 11/2018, pt considering sleeve gastectomy vs bipass as of eval by Dr. Alvino Blood considering as of 12/2019.   Nonalcoholic steatohepatitis    Viral Hep screens NEG.  CT 2015.  Transaminasemia.  U/S 07/2018 showed early changes of cirrhosis.   OSA (obstructive sleep apnea) 09/14/2015   sleep study 09/07/15: severe obstructive sleep apnea with an AHI of 72 and SaO2 low of 75%.>referred to sleep MD   Osteoarthritis    hips, shoulders, knees   PAF (paroxysmal atrial fibrillation) (HCC)    One documented episode (after getting EGD 2016).  Was on amiodarone x 3 mo.  Rate control with metoprolol + anticoag with xarelto. Watchman 12/2022, off anticoag   Recurrent epistaxis    Granuloma in L nare cauterized by ENT 04/2020. Another cautery 06/2020   Small fiber neuropathy    Due to  DM.  Symmetric hands and feet tingling/numbness.   Ulcerative colitis (HCC)    Remicade infusion Q 8 weeks: in clinical and endoscopic remission as of 12/2018 GI f/u.  06/11/19 rpt colonoscopy->cecal and ascending colon colitis.    Past Surgical History:  Procedure Laterality Date   ABDOMINAL HYSTERECTOMY  1980   Paps no longer indicated.   BACK SURGERY     BACTERIAL OVERGROWTH TEST N/A 07/13/2015   Procedure: BACTERIAL OVERGROWTH TEST;  Surgeon: Malissa Hippo, MD;  [facility];  Service: Endoscopy;  Laterality: N/A;  730     BILATERAL SALPINGOOPHORECTOMY  02/10/2001   BIOPSY  06/11/2019   Procedure: BIOPSY;  Surgeon: Malissa Hippo, MD;  [facility]: [facility];  Service: Endoscopy;;  colon   BIOPSY  09/27/2019   Procedure: BIOPSY;  Surgeon: Malissa Hippo, MD;  [facility]: [facility];  Service: Endoscopy;;  gastric duodenal   BIOPSY  09/15/2020   Procedure: BIOPSY;  Surgeon: Dolores Frame, MD;  [facility];  Service: Gastroenterology;;   BREAST REDUCTION SURGERY  1994   bilat   BREAST SURGERY     CARDIOVASCULAR STRESS TEST  07/2010   Lexiscan myoview: normal   CARPAL TUNNEL RELEASE Right 1996   CARPAL TUNNEL RELEASE Left 03/21/2003   CARPAL TUNNEL RELEASE Right 05/04/2013   Procedure: CARPAL TUNNEL RELEASE;  Surgeon: Wyn Forster., MD;  Location: [facility];  Service: Orthopedics;  Laterality: Right;   CARPAL TUNNEL RELEASE Left 09/21/2013   Procedure: LEFT CARPAL TUNNEL RELEASE;  Surgeon: Wyn Forster., MD;  Location: [facility];  Service: Orthopedics;  Laterality: Left;   CHOLECYSTECTOMY     COLONOSCOPY WITH PROPOFOL N/A 08/04/2015   Colitis in remission.  No polyps.  Procedure: COLONOSCOPY WITH PROPOFOL;  Surgeon: Malissa Hippo, MD;  Location: [facility];  Service: Endoscopy;  Laterality: N/A;  cecum time in  0820   time out  0827    total time 7 minutes   COLONOSCOPY WITH PROPOFOL N/A 06/11/2019   cecal and ascending colon colitis.  Procedure: COLONOSCOPY WITH PROPOFOL;  Surgeon: Malissa Hippo, MD;  [facility];  Service: Endoscopy;  Laterality: N/A;  730a   COLONOSCOPY WITH PROPOFOL N/A 09/15/2020   Procedure: COLONOSCOPY WITH PROPOFOL;  Surgeon: Dolores Frame, MD;  [facility];  Service: Gastroenterology;  Laterality: N/A;  1030   ESOPHAGEAL DILATION N/A 08/04/2015   Procedure: ESOPHAGEAL DILATION;  Surgeon: Malissa Hippo, MD;   Location: [facility];  Service: Endoscopy;  Laterality: N/ALauralee Evener, no mucousal disruption   ESOPHAGOGASTRODUODENOSCOPY  09/27/2019   Performed for IDA.  Esoph dilation was done but no stricture present.  Mild portal hypertensive gastropathy, o/w normal.  Duodenal bx NEG.  h pylori neg.   ESOPHAGOGASTRODUODENOSCOPY (EGD) WITH ESOPHAGEAL DILATION  12/02/2005   ESOPHAGOGASTRODUODENOSCOPY (EGD) WITH PROPOFOL N/A 08/04/2015   Procedure: ESOPHAGOGASTRODUODENOSCOPY (EGD) WITH PROPOFOL;  Surgeon: Malissa Hippo, MD;  [facility]: [facility];  Service: Endoscopy;  Laterality: N/A;  procedure 1   ESOPHAGOGASTRODUODENOSCOPY (EGD) WITH PROPOFOL N/A 09/27/2019   Procedure: ESOPHAGOGASTRODUODENOSCOPY (EGD) WITH PROPOFOL;  Surgeon: Malissa Hippo, MD;  [facility];  Service: Endoscopy;  Laterality: N/A;  12:10   ESOPHAGOGASTRODUODENOSCOPY (EGD) WITH PROPOFOL N/A 10/31/2021   Procedure: ESOPHAGOGASTRODUODENOSCOPY (EGD) WITH PROPOFOL;  Surgeon: Malissa Hippo, MD;  [facility];  Service: Endoscopy;  Laterality: N/A;  930   FLEXIBLE  SIGMOIDOSCOPY  01/17/2012   Procedure: FLEXIBLE SIGMOIDOSCOPY;  Surgeon: Malissa Hippo, MD;  [facility];  Service: Endoscopy;  Laterality: N/A;   GIVENS CAPSULE STUDY N/A 08/03/2019   Procedure: GIVENS CAPSULE STUDY (performed for IDA)->some food debris in stomach and small amount of blood.  Surgeon: Malissa Hippo, MD;  [facility];  Service: Endoscopy;  Laterality: N/A;  730AM   HEMILAMINOTOMY LUMBAR SPINE Bilateral 09/07/1999   L4-5   KNEE ARTHROSCOPY Right 01/1999; 10/2000   LEFT ATRIAL APPENDAGE OCCLUSION N/A 01/23/2023   Procedure: LEFT ATRIAL APPENDAGE OCCLUSION;  Surgeon: [name], MD;  [facility];  Service: Cardiovascular;  Laterality: N/A;   LYSIS OF ADHESION  02/10/2001   MALONEY DILATION  09/27/2019   Procedure: Elease Hashimoto DILATION;  Surgeon: Malissa Hippo, MD;  [facility];  Service:  Endoscopy;;   NASAL ENDOSCOPY WITH EPISTAXIS CONTROL Bilateral 01/25/2022   Procedure: NASAL ENDOSCOPY WITH EPISTAXIS CONTROL;  Surgeon: Osborn Coho, MD;  Location: [facility];  Service: ENT;  Laterality: Bilateral;   NASAL SEPTOPLASTY W/ TURBINOPLASTY Bilateral 01/25/2022   Procedure: NASAL SEPTOPLASTY WITH TURBINATE REDUCTION;  Surgeon: Osborn Coho, MD;  Location: [facility] OR;  Service: ENT;  Laterality: Bilateral;   RECTOCELE REPAIR  1990; 09/12/2006   TARSAL TUNNEL RELEASE  2002   TEE WITHOUT CARDIOVERSION N/A 01/23/2023   Procedure: TRANSESOPHAGEAL ECHOCARDIOGRAM;  Surgeon: [name], MD;  Location: [facility] INVASIVE CV LAB;  Service: Cardiovascular;  Laterality: N/A;   TRANSTHORACIC ECHOCARDIOGRAM  08/04/2015   EF 60-65%, normal wall motion, mild LVH, mild LA dilation, grd I DD.   TUMOR EXCISION Left 03/21/2003   dorsal 1st web space (hand)   URETEROLYSIS Right 02/10/2001    Outpatient Medications Prior to Visit  Medication Sig Dispense Refill   Accu-Chek Softclix Lancets lancets USE TO CHECK BLOOD GLUCOSE UP TO 6 TIMES DAILY AS DIRECTED 200 each 5   acetaminophen (TYLENOL) 325 MG tablet Take 650 mg by mouth every 6 (six) hours as needed for moderate pain or headache.     amoxicillin (AMOXIL) 500 MG tablet Take 4 tablets (2,000 mg total) by mouth as directed. 1 HOUR PRIOR TO DENTAL APPOINTMENTS (Patient not taking: Reported on 05/29/2023) 12 tablet 6   blood glucose meter kit and supplies KIT Use up to six times daily as directed. DX. E11.9 1 each 0   canagliflozin (INVOKANA) 100 MG TABS tablet Take 1 tablet (100 mg total) by mouth daily before breakfast. 90 tablet 1   citalopram (CELEXA) 20 MG tablet Take 1 tablet (20 mg total) by mouth daily. 90 tablet 1   clopidogrel (PLAVIX) 75 MG tablet Take 1 tablet (75 mg total) by mouth daily. START ON 6/3 90 tablet 1   dicyclomine (BENTYL) 10 MG capsule TAKE 1 CAPSULE (10 MG TOTAL) BY MOUTH EVERY 8 (EIGHT) HOURS AS NEEDED FOR SPASMS (ABDOMINAL  PAIN). 270 capsule 1   diphenoxylate-atropine (LOMOTIL) 2.5-0.025 MG tablet Take 1 tablet by mouth 4 (four) times daily as needed for diarrhea or loose stools. 60 tablet 2   Dulaglutide (TRULICITY) 1.5 MG/0.5ML SOPN INJECT 1.5MG  ONCE WEEKLY 18 mL 3   esomeprazole (NEXIUM) 20 MG capsule TAKE 1 CAPSULE BY MOUTH DAILY BEFORE BREAKFAST 90 capsule 3   ferric carboxymaltose (INJECTAFER) 750 MG/15ML SOLN injection Inject 15 mLs (750 mg total) into the vein as directed for 1 dose. Repeat in 7 days 15 mL 1   furosemide (LASIX) 40 MG tablet Take 1 tablet (40  mg total) by mouth daily. 90 tablet 1   gabapentin (NEURONTIN) 600 MG tablet TAKE 1 AND 1/2 TABLETS BY MOUTH TWICE A DAY. MUST KEEP APPT FOR FURTHER REFILLS 90 tablet 0   glucose blood (ACCU-CHEK GUIDE) test strip USE TO CHECK BLOOD GLUCOSE UP TO 6 TIMES DAILY AS DIRECTED 600 strip 3   INFLIXIMAB IV Inject into the vein every 8 (eight) weeks. Remicaid 5mg /kg Infusion every 8 weeks [facility])     Insulin Pen Needle (B-D UF III MINI PEN NEEDLES) 31G X 5 MM MISC USE TO INJECT INSULINS EQUAL TO 6 TIMES DAILY. 500 each 6   insulin regular human CONCENTRATED (HUMULIN R U-500 KWIKPEN) 500 UNIT/ML KwikPen Inject 100 units into the skin at breakfast and lunch. 48 mL 1   lactase (LACTAID) 3000 UNITS tablet Take 3,000 Units by mouth as needed (when eating foods containing dairy).      meclizine (ANTIVERT) 25 MG tablet Take 1 tablet (25 mg total) by mouth 3 (three) times daily as needed for dizziness or nausea. 90 tablet 0   metoprolol succinate (TOPROL-XL) 100 MG 24 hr tablet TAKE 1 TABLET BY MOUTH 2 TIMES DAILY. TAKE WITH OR IMMEDIATELY FOLLOWING A MEAL 180 tablet 1   potassium chloride (KLOR-CON M10) 10 MEQ tablet Take 1 tablet (10 mEq total) by mouth daily. 90 tablet 1   No facility-administered medications prior to visit.    Allergies  Allergen Reactions   Omeprazole Anaphylaxis and Swelling    SWELLING OF TONGUE AND THROAT   Actos  [Pioglitazone] Swelling and Other (See Comments)    Weight gain, tongue swelling    Benzocaine-Menthol Swelling    SWELLING OF MOUTH   Colesevelam Other (See Comments)    GI UPSET   Flagyl [Metronidazole Hcl] Other (See Comments)    DIAPHORESIS   Metformin And Related Diarrhea   Shrimp [Shellfish Allergy] Itching    OF THROAT AND EARS, if consumed raw   Statins Palpitations   Allevyn Adhesive [Wound Dressings]     Other reaction(s): Unknown   Desipramine Hcl Itching, Nausea Only and Other (See Comments)    "swimmy" headed, ears itched    Hydromorphone Itching   Jardiance [Empagliflozin] Other (See Comments)    weakness   Lactose Intolerance (Gi) Diarrhea    Gas, bloating   Adhesive [Tape] Other (See Comments)    SKIN IRRITATION AND BRUISING   Nisoldipine Itching   Percocet [Oxycodone-Acetaminophen] Itching    Review of Systems As per HPI  PE:    05/29/2023    1:05 PM 05/08/2023    2:49 PM 05/06/2023    1:51 PM  Vitals with BMI  Height 5\' 2"  5\' 2"  5\' 2"   Weight 232 lbs 233 lbs 11 oz 234 lbs 13 oz  BMI 42.42 42.73 42.93  Systolic 101 117 161  Diastolic 58 54 64  Pulse 73 74 68     Physical Exam  ***  LABS:  Last CBC Lab Results  Component Value Date   WBC 3.7 (L) 05/29/2023   HGB 8.8 Repeated and verified X2. (L) 05/29/2023   HCT 29.5 (L) 05/29/2023   MCV 79.8 05/29/2023   MCH 24.7 (L) 05/08/2023   RDW 18.0 (H) 05/29/2023   PLT 134.0 (L) 05/29/2023   Last metabolic panel Lab Results  Component Value Date   GLUCOSE 203 (H) 05/29/2023   NA 139 05/29/2023   K 3.5 05/29/2023   CL 101 05/29/2023   CO2 27 05/29/2023   BUN  8 05/29/2023   CREATININE 0.64 05/29/2023   GFR 86.23 05/29/2023   CALCIUM 8.5 05/29/2023   PROT 6.9 05/08/2023   ALBUMIN 3.3 (A) 02/25/2023   LABGLOB 3.4 08/17/2015   AGRATIO 1.0 08/17/2015   BILITOT 0.7 05/08/2023   ALKPHOS 108 02/25/2023   AST 29 05/08/2023   ALT 15 05/08/2023   ANIONGAP 5 10/10/2022   Last lipids Lab  Results  Component Value Date   CHOL 149 05/06/2023   HDL 38 (L) 05/06/2023   LDLCALC 89 05/06/2023   LDLDIRECT 69.5 01/24/2011   TRIG 123 05/06/2023   CHOLHDL 3.9 05/06/2023   Last hemoglobin A1c Lab Results  Component Value Date   HGBA1C 8.5 (A) 05/29/2023   HGBA1C 8.5 05/29/2023   HGBA1C 8.5 (A) 05/29/2023   HGBA1C 8.5 (A) 05/29/2023   Last thyroid functions Lab Results  Component Value Date   TSH 0.618 10/09/2022   Last vitamin B12 and Folate Lab Results  Component Value Date   VITAMINB12 246 05/29/2023   FOLATE 18.4 05/29/2023   IMPRESSION AND PLAN:  No problem-specific Assessment & Plan notes found for this encounter.   An After Visit Summary was printed and given to the patient.  FOLLOW UP: No follow-ups on file.  Signed:  Santiago Bumpers, MD           06/12/2023

## 2023-06-17 ENCOUNTER — Emergency Department (HOSPITAL_COMMUNITY)
Admission: EM | Admit: 2023-06-17 | Discharge: 2023-06-17 | Disposition: A | Discharge status: Home / Self Care | Payer: Medicare PPO | Attending: Emergency Medicine | Admitting: Emergency Medicine

## 2023-06-17 ENCOUNTER — Other Ambulatory Visit: Payer: Self-pay

## 2023-06-17 ENCOUNTER — Emergency Department (HOSPITAL_COMMUNITY): Payer: Medicare PPO

## 2023-06-17 DIAGNOSIS — Z7902 Long term (current) use of antithrombotics/antiplatelets: Secondary | ICD-10-CM | POA: Insufficient documentation

## 2023-06-17 DIAGNOSIS — S0031XA Abrasion of nose, initial encounter: Secondary | ICD-10-CM | POA: Insufficient documentation

## 2023-06-17 DIAGNOSIS — J342 Deviated nasal septum: Secondary | ICD-10-CM | POA: Diagnosis not present

## 2023-06-17 DIAGNOSIS — S022XXA Fracture of nasal bones, initial encounter for closed fracture: Secondary | ICD-10-CM | POA: Diagnosis not present

## 2023-06-17 DIAGNOSIS — R609 Edema, unspecified: Secondary | ICD-10-CM | POA: Diagnosis not present

## 2023-06-17 DIAGNOSIS — M4802 Spinal stenosis, cervical region: Secondary | ICD-10-CM | POA: Diagnosis not present

## 2023-06-17 DIAGNOSIS — M47811 Spondylosis without myelopathy or radiculopathy, occipito-atlanto-axial region: Secondary | ICD-10-CM | POA: Diagnosis not present

## 2023-06-17 DIAGNOSIS — I672 Cerebral atherosclerosis: Secondary | ICD-10-CM | POA: Diagnosis not present

## 2023-06-17 DIAGNOSIS — Z79899 Other long term (current) drug therapy: Secondary | ICD-10-CM | POA: Insufficient documentation

## 2023-06-17 DIAGNOSIS — S0291XA Unspecified fracture of skull, initial encounter for closed fracture: Secondary | ICD-10-CM | POA: Diagnosis not present

## 2023-06-17 DIAGNOSIS — I10 Essential (primary) hypertension: Secondary | ICD-10-CM | POA: Diagnosis not present

## 2023-06-17 DIAGNOSIS — M542 Cervicalgia: Secondary | ICD-10-CM | POA: Insufficient documentation

## 2023-06-17 DIAGNOSIS — E119 Type 2 diabetes mellitus without complications: Secondary | ICD-10-CM | POA: Diagnosis not present

## 2023-06-17 DIAGNOSIS — Y92002 Bathroom of unspecified non-institutional (private) residence single-family (private) house as the place of occurrence of the external cause: Secondary | ICD-10-CM | POA: Insufficient documentation

## 2023-06-17 DIAGNOSIS — W19XXXA Unspecified fall, initial encounter: Secondary | ICD-10-CM | POA: Diagnosis not present

## 2023-06-17 DIAGNOSIS — W1830XA Fall on same level, unspecified, initial encounter: Secondary | ICD-10-CM | POA: Insufficient documentation

## 2023-06-17 DIAGNOSIS — M5030 Other cervical disc degeneration, unspecified cervical region: Secondary | ICD-10-CM | POA: Diagnosis not present

## 2023-06-17 DIAGNOSIS — S0990XA Unspecified injury of head, initial encounter: Secondary | ICD-10-CM | POA: Diagnosis not present

## 2023-06-17 DIAGNOSIS — I6782 Cerebral ischemia: Secondary | ICD-10-CM | POA: Diagnosis not present

## 2023-06-17 DIAGNOSIS — Z794 Long term (current) use of insulin: Secondary | ICD-10-CM | POA: Diagnosis not present

## 2023-06-17 DIAGNOSIS — S0083XA Contusion of other part of head, initial encounter: Secondary | ICD-10-CM | POA: Insufficient documentation

## 2023-06-17 DIAGNOSIS — Z043 Encounter for examination and observation following other accident: Secondary | ICD-10-CM | POA: Diagnosis not present

## 2023-06-17 DIAGNOSIS — S0003XA Contusion of scalp, initial encounter: Secondary | ICD-10-CM | POA: Diagnosis not present

## 2023-06-17 DIAGNOSIS — R519 Headache, unspecified: Secondary | ICD-10-CM | POA: Diagnosis not present

## 2023-06-17 MED ORDER — CEPHALEXIN 500 MG PO CAPS: 500.0000 mg | ORAL_CAPSULE | Freq: Four times a day (QID) | ORAL | 0 refills | Status: AC

## 2023-06-17 NOTE — ED Triage Notes (Signed)
PT BIB [facility] for fall in the bathroom @ the dr's office. Pt fell onto left side hitting her head, she is on plavix.  Staff at dr's office stated pt was bleeding from bilateral nares but was resolved upon arrival of EMS. Denies LOC. Pt only c/o facial pain at time of triage.  22g in right hand 158/92 84 HR  98% RA CBG 100

## 2023-06-17 NOTE — ED Provider Notes (Signed)
[facility] AT [facility] Provider Note   CSN: [id number] Arrival date & time: 06/17/23  1503     History  No chief complaint on file.   [name] is a 77 y.o. female.  HPI 77 year old female history of GERD, hypertension, hyperlipidemia, diabetes, atrial fibrillation presenting for follow-up.  She was at the doctor's office today when she lost her balance and fell forward.  She hit her face.  She had some epistaxis which since resolved.  She takes Plavix but no aspirin or other anticoagulation.  She has mild frontal headache.  No vision changes or weakness or numbness.  She has mild pain to her neck which feels slightly tight.  No nausea or vomiting.  No chest pain, back pain, abdominal pain.  No pain in her extremities.  No dizziness, presyncope or syncope.     Home Medications Prior to Admission medications   Medication Sig Start Date End Date Taking? Authorizing Provider  cephALEXin (KEFLEX) 500 MG capsule Take 1 capsule (500 mg total) by mouth 4 (four) times daily for 7 days. 06/17/23 06/24/23 Yes Laurence Spates, MD  Accu-Chek Softclix Lancets lancets USE TO CHECK BLOOD GLUCOSE UP TO 6 TIMES DAILY AS DIRECTED 02/13/23   McGowen, Maryjean Morn, MD  acetaminophen (TYLENOL) 325 MG tablet Take 650 mg by mouth every 6 (six) hours as needed for moderate pain or headache.    [provider]  blood glucose meter kit and supplies KIT Use up to six times daily as directed. DX. E11.9 01/05/20   McGowen, Maryjean Morn, MD  canagliflozin (INVOKANA) 100 MG TABS tablet Take 1 tablet (100 mg total) by mouth daily before breakfast. 05/29/23   McGowen, Maryjean Morn, MD  citalopram (CELEXA) 20 MG tablet Take 1 tablet (20 mg total) by mouth daily. 05/29/23   McGowen, Maryjean Morn, MD  clopidogrel (PLAVIX) 75 MG tablet Take 1 tablet (75 mg total) by mouth daily. START ON 6/3 02/28/23   Janetta Hora, PA-C  dicyclomine (BENTYL) 10 MG capsule TAKE 1 CAPSULE (10 MG TOTAL) BY  MOUTH EVERY 8 (EIGHT) HOURS AS NEEDED FOR SPASMS (ABDOMINAL PAIN). 09/20/22   Dolores Frame, MD  diphenoxylate-atropine (LOMOTIL) 2.5-0.025 MG tablet Take 1 tablet by mouth 4 (four) times daily as needed for diarrhea or loose stools. 12/10/22   Dolores Frame, MD  Dulaglutide (TRULICITY) 1.5 MG/0.5ML SOPN INJECT 1.5MG  ONCE WEEKLY 09/04/22   McGowen, Maryjean Morn, MD  esomeprazole (NEXIUM) 20 MG capsule TAKE 1 CAPSULE BY MOUTH DAILY BEFORE BREAKFAST 12/04/21   Rehman, Joline Maxcy, MD  ferric carboxymaltose (INJECTAFER) 750 MG/15ML SOLN injection Inject 15 mLs (750 mg total) into the vein as directed for 1 dose. Repeat in 7 days 02/27/21   McGowen, Maryjean Morn, MD  furosemide (LASIX) 40 MG tablet Take 1 tablet (40 mg total) by mouth daily. 05/29/23   McGowen, Maryjean Morn, MD  gabapentin (NEURONTIN) 600 MG tablet TAKE 1 AND 1/2 TABLETS BY MOUTH TWICE A DAY. MUST KEEP APPT FOR FURTHER REFILLS 05/13/23   McGowen, Maryjean Morn, MD  glucose blood (ACCU-CHEK GUIDE) test strip USE TO CHECK BLOOD GLUCOSE UP TO 6 TIMES DAILY AS DIRECTED 03/04/23   McGowen, Maryjean Morn, MD  INFLIXIMAB IV Inject into the vein every 8 (eight) weeks. Remicaid 5mg /kg Infusion every 8 weeks [facility])    [provider]  Insulin Pen Needle (B-D UF III MINI PEN NEEDLES) 31G X 5 MM MISC USE TO INJECT INSULINS EQUAL  TO 6 TIMES DAILY. 03/04/23   McGowen, Maryjean Morn, MD  insulin regular human CONCENTRATED (HUMULIN R U-500 KWIKPEN) 500 UNIT/ML KwikPen Inject 100 units into the skin at breakfast and lunch. 06/05/23   McGowen, Maryjean Morn, MD  lactase (LACTAID) 3000 UNITS tablet Take 3,000 Units by mouth as needed (when eating foods containing dairy).     [provider]  meclizine (ANTIVERT) 25 MG tablet Take 1 tablet (25 mg total) by mouth 3 (three) times daily as needed for dizziness or nausea. 12/07/15   Pincus Sanes, MD  metoprolol succinate (TOPROL-XL) 100 MG 24 hr tablet TAKE 1 TABLET BY MOUTH 2 TIMES DAILY. TAKE WITH  OR IMMEDIATELY FOLLOWING A MEAL 05/29/23   McGowen, Maryjean Morn, MD  potassium chloride (KLOR-CON M10) 10 MEQ tablet Take 1 tablet (10 mEq total) by mouth daily. 05/29/23   McGowen, Maryjean Morn, MD  bromocriptine (PARLODEL) 2.5 MG tablet Take 2.5 mg by mouth 2 (two) times daily.  12/05/11  [provider]      Allergies    Omeprazole, Actos [pioglitazone], Benzocaine-menthol, Colesevelam, Flagyl [metronidazole hcl], Metformin and related, Shrimp [shellfish allergy], Statins, Allevyn adhesive [wound dressings], Desipramine hcl, Hydromorphone, Jardiance [empagliflozin], Lactose intolerance (gi), Adhesive [tape], Nisoldipine, and Percocet [oxycodone-acetaminophen]    Review of Systems   Review of Systems Review of systems completed and notable as per HPI.  ROS otherwise negative.   Physical Exam Updated Vital Signs BP (!) 144/61   Pulse 75   Temp 98.1 F (36.7 C)   Resp 17   Ht 5\' 2"  (1.575 m)   Wt 105.7 kg   SpO2 100%   BMI 42.62 kg/m  Physical Exam Vitals and nursing note reviewed.  Constitutional:      General: She is not in acute distress.    Appearance: She is well-developed.  HENT:     Head: Normocephalic.     Comments: Superficial abrasion over the bridge of the nose.  No laceration.  There are some dried blood in the naris but no nasal septal hematoma, tenderness, or epistaxis currently.  Has mild bruising to the forehead.    Nose: Nose normal.     Mouth/Throat:     Mouth: Mucous membranes are moist.     Pharynx: Oropharynx is clear.  Eyes:     Extraocular Movements: Extraocular movements intact.     Conjunctiva/sclera: Conjunctivae normal.     Pupils: Pupils are equal, round, and reactive to light.  Cardiovascular:     Rate and Rhythm: Normal rate and regular rhythm.     Heart sounds: No murmur heard. Pulmonary:     Effort: Pulmonary effort is normal. No respiratory distress.     Breath sounds: Normal breath sounds.  Abdominal:     Palpations: Abdomen is soft.      Tenderness: There is no abdominal tenderness.  Musculoskeletal:        General: No swelling.     Cervical back: Normal range of motion and neck supple. No rigidity or tenderness.     Comments: No spinal tenderness  Skin:    General: Skin is warm and dry.     Capillary Refill: Capillary refill takes less than 2 seconds.  Neurological:     General: No focal deficit present.     Mental Status: She is alert and oriented to person, place, and time. Mental status is at baseline.     Cranial Nerves: No cranial nerve deficit.     Sensory: No sensory deficit.  Motor: No weakness.  Psychiatric:        Mood and Affect: Mood normal.     ED Results / Procedures / Treatments   Labs (all labs ordered are listed, but only abnormal results are displayed) Labs Reviewed - No data to display  EKG None  Radiology CT Maxillofacial WO CM  Result Date: 06/17/2023 CLINICAL DATA:  Blunt trauma, fall on blood thinners. EXAM: CT MAXILLOFACIAL WITHOUT CONTRAST TECHNIQUE: Multidetector CT imaging of the maxillofacial structures was performed. Multiplanar CT image reconstructions were also generated. RADIATION DOSE REDUCTION: This exam was performed according to the departmental dose-optimization program which includes automated exposure control, adjustment of the mA and/or kV according to patient size and/or use of iterative reconstruction technique. COMPARISON:  None Available. FINDINGS: Osseous: Minimally depressed nasal bone fracture, series 10, image 48. Zygomatic arches and mandibles are intact. Temporomandibular joints are congruent. Intact pterygoid plates. Slight leftward nasal septal deviation. Orbits: No acute orbital fracture.  No globe injury. Sinuses: No sinus fracture or hemosinus. Small mucous retention cyst in the right maxillary sinus. No mastoid effusion. Soft tissues: Frontal scalp hematoma. Limited intracranial: Assessed on concurrent head CT, reported separately. IMPRESSION: 1. Minimally  depressed nasal bone fracture. 2. Frontal scalp hematoma. Electronically Signed   By: Narda Rutherford M.D.   On: 06/17/2023 19:00   CT Cervical Spine Wo Contrast  Result Date: 06/17/2023 CLINICAL DATA:  Fall on blood thinners. EXAM: CT CERVICAL SPINE WITHOUT CONTRAST TECHNIQUE: Multidetector CT imaging of the cervical spine was performed without intravenous contrast. Multiplanar CT image reconstructions were also generated. RADIATION DOSE REDUCTION: This exam was performed according to the departmental dose-optimization program which includes automated exposure control, adjustment of the mA and/or kV according to patient size and/or use of iterative reconstruction technique. COMPARISON:  None Available. FINDINGS: Alignment: Straightening of normal lordosis. No traumatic subluxation. Skull base and vertebrae: No acute fracture. Vertebral body heights are maintained. The dens and skull base are intact. There are flowing anterior osteophytes from C4-C5 through C7-T1. Chronic C1-C2 arthropathy. Soft tissues and spinal canal: No prevertebral fluid or swelling. No visible canal hematoma. Disc levels: Moderate diffuse degenerative disc disease. There are large flowing anterior osteophytes from C4-C5 through C7-T1. Moderate multilevel facet hypertrophy. Mild C5-C6 and C6-C7 spinal canal stenosis. Upper chest: No acute findings. Other: None. IMPRESSION: 1. No acute fracture or traumatic subluxation of the cervical spine. 2. Multilevel degenerative disc disease and facet hypertrophy. Large flowing anterior osteophytes from C4-C5 through C7-T1 consistent with diffuse idiopathic skeletal hyperostosis. Electronically Signed   By: Narda Rutherford M.D.   On: 06/17/2023 18:55   CT Head Wo Contrast  Result Date: 06/17/2023 CLINICAL DATA:  Head trauma, minor (Age >= 65y) Fall, on blood thinners. EXAM: CT HEAD WITHOUT CONTRAST TECHNIQUE: Contiguous axial images were obtained from the base of the skull through the vertex  without intravenous contrast. RADIATION DOSE REDUCTION: This exam was performed according to the departmental dose-optimization program which includes automated exposure control, adjustment of the mA and/or kV according to patient size and/or use of iterative reconstruction technique. COMPARISON:  Head CT 10/09/2022 FINDINGS: Brain: No intracranial hemorrhage, mass effect, or midline shift. No hydrocephalus. The basilar cisterns are patent. Periventricular and deep white matter hypoattenuation typical of chronic ischemic change. No evidence of territorial infarct or acute ischemia. No extra-axial or intracranial fluid collection. Vascular: Atherosclerosis of skullbase vasculature without hyperdense vessel or abnormal calcification. Skull: No fracture or focal lesion. Sinuses/Orbits: Assessed on concurrent face CT, reported separately. Other: Midline  frontal scalp hematoma. IMPRESSION: Midline frontal scalp hematoma. No acute intracranial abnormality. No skull fracture. Electronically Signed   By: Narda Rutherford M.D.   On: 06/17/2023 18:49    Procedures Procedures    Medications Ordered in ED Medications - No data to display  ED Course/ Medical Decision Making/ A&P Clinical Course as of 06/17/23 1942  Tue Jun 17, 2023  1647 Tdap up-to-date [JD]    Clinical Course User Index [JD] Laurence Spates, MD                                 Medical Decision Making Amount and/or Complexity of Data Reviewed Radiology: ordered.   Medical Decision Making:   JOUA VANDERKOLK is a 77 y.o. female who presented to the ED today with mechanical fall.  She is on Plavix and was activated as a level 2 trauma prior to arrival.  She reports losing her balance and falling forward.  She had no presyncope or syncope seems to be purely mechanical.  She has bruising to the face and nose and is on Plavix no other anticoagulation.  She has no other signs of trauma or pain on exam.  Will obtain CT head, face, cervical spine  for evaluation.   Patient placed on continuous vitals and telemetry monitoring while in ED which was reviewed periodically.  Reviewed and confirmed nursing documentation for past medical history, family history, social history.  Reassessment and Plan:   On reassessment she remained stable.  Ambulate without difficulty, only mild headache.  CT head and cervical spine without acute abnormality.  She does have minimally displaced nasal bone fracture.  I reexamined her, she has no epistaxis here and no nasal septal hematoma.  She has a very superficial abrasion over the bridge of her nose, I examined this and cleaned it, it is not full-thickness I do not think this is actually an open fracture but will put on a course of antibiotics out of abundance of caution.  Will have her follow-up with ENT.  Her Tdap is up-to-date.  Strict turn precautions given.  Discharged in stable condition.   Patient's presentation is most consistent with acute presentation with potential threat to life or bodily function.           Final Clinical Impression(s) / ED Diagnoses Final diagnoses:  Fall, initial encounter    Rx / DC Orders ED Discharge Orders          Ordered    cephALEXin (KEFLEX) 500 MG capsule  4 times daily        06/17/23 1941              Laurence Spates, MD 06/17/23 1942

## 2023-06-17 NOTE — Progress Notes (Signed)
   06/17/23 1600  Spiritual Encounters  Type of Visit Initial  Care provided to: Pt and family  Referral source Trauma page  Reason for visit Trauma  OnCall Visit No   Ch responded to trauma level II page. Pt's spouse was at bedside. They have been married for over 57 years and have one son and two grandchildren. Ch encouraged storytelling and provided hospitality. No follow-up needed at this time.

## 2023-06-17 NOTE — Discharge Instructions (Signed)
You were seen today for a fall.  The CT scan of your head and neck did not show any injury other than some bruising.  The CT of your face did show a mild fracture of your nasal bone.  You are being placed on antibiotics to help event infection.  You should follow-up with an ENT doctor using the phone number above.  You should also follow with your primary care doctor.  If you develop severe pain, nosebleeds that will not stop, fever or other signs of infection you should return to the ED.

## 2023-06-17 NOTE — ED Notes (Signed)
Assisted pt with bedside commode.  Pt tolerated well.

## 2023-06-23 ENCOUNTER — Telehealth: Payer: Self-pay

## 2023-06-23 MED ORDER — METHOCARBAMOL 500 MG PO TABS: 500.0000 mg | ORAL_TABLET | Freq: Three times a day (TID) | ORAL | 0 refills | Status: DC | PRN

## 2023-06-23 NOTE — Telephone Encounter (Signed)
Rx sent 

## 2023-06-23 NOTE — Telephone Encounter (Signed)
Pt made aware

## 2023-06-23 NOTE — Telephone Encounter (Signed)
Patient fell at Dr. Shawnee Knapp office and was taken to ED my ambulance on 9/17.  Patient is asking for muscle relaxer medication.   Please advise.  If appt needed, can virtual be okay?

## 2023-07-15 ENCOUNTER — Ambulatory Visit (HOSPITAL_COMMUNITY)
Admission: RE | Admit: 2023-07-15 | Discharge: 2023-07-15 | Disposition: A | Discharge status: Home / Self Care | Payer: Medicare PPO | Source: Ambulatory Visit | Attending: Family Medicine | Admitting: Family Medicine

## 2023-07-15 DIAGNOSIS — R270 Ataxia, unspecified: Secondary | ICD-10-CM | POA: Diagnosis not present

## 2023-07-15 DIAGNOSIS — I6782 Cerebral ischemia: Secondary | ICD-10-CM | POA: Diagnosis not present

## 2023-07-15 DIAGNOSIS — S0990XA Unspecified injury of head, initial encounter: Secondary | ICD-10-CM | POA: Diagnosis not present

## 2023-07-17 ENCOUNTER — Telehealth: Payer: Self-pay | Admitting: Family Medicine

## 2023-07-17 NOTE — Telephone Encounter (Signed)
Patient declined scheduling an appointment. She says she is unable to schedule an appointment. She says she can not come in and needs something called in for a possible UTI. She has had frequent urination and has been urinating on herself. She denies pain and states these symptoms started on Sunday and are worsening.

## 2023-07-18 ENCOUNTER — Telehealth: Payer: Self-pay

## 2023-07-18 NOTE — Telephone Encounter (Signed)
Unable to LVM.

## 2023-07-18 NOTE — Telephone Encounter (Signed)
Transition Care Management Unsuccessful Follow-up Telephone Call  Date of discharge and from where:  [name] 9/17  Attempts:  1st Attempt  Reason for unsuccessful TCM follow-up call:  No answer/busy   [name] [name]  [facility], [facility] Guide, Phone: [phone] Website: [name]

## 2023-07-18 NOTE — Telephone Encounter (Signed)
Transition Care Management Unsuccessful Follow-up Telephone Call  Date of discharge and from where:  [facility]17  Attempts:  2nd Attempt  Reason for unsuccessful TCM follow-up call:  No answer/busy   Lenard Forth   [facility], [facility] Guide, Phone: [phone] Website: [name]

## 2023-07-21 NOTE — Telephone Encounter (Signed)
Tried calling patient, unable to LVM.   Note: if call returned, pt will need to schedule an appt for medication.

## 2023-07-21 NOTE — Telephone Encounter (Signed)
Patient called back. She stated their was no possible way she can come into office.  I told her that we would have to get urine sample for Dr. Milinda Cave to know how to treat properly. I offered her appt with Dr. Claiborne Billings tomorrow.  She declined. She said I only want to see Dr. Milinda Cave. Can we work her in tomorrow at 11:40am spot or Wednesday 11:40am.  I will tell patient to come in earlier so we can get urine sample.  Please advise.

## 2023-07-21 NOTE — Telephone Encounter (Signed)
Please further advise. Wednesday's 11am-11:40 slot is booked for a hospital follow up

## 2023-07-22 ENCOUNTER — Encounter: Payer: Self-pay | Admitting: Family Medicine

## 2023-07-22 ENCOUNTER — Ambulatory Visit: Payer: Medicare PPO | Admitting: Family Medicine

## 2023-07-22 ENCOUNTER — Other Ambulatory Visit: Payer: Self-pay

## 2023-07-22 VITALS — BP 129/75 | HR 74 | Wt 225.2 lb

## 2023-07-22 DIAGNOSIS — R300 Dysuria: Secondary | ICD-10-CM | POA: Diagnosis not present

## 2023-07-22 DIAGNOSIS — I480 Paroxysmal atrial fibrillation: Secondary | ICD-10-CM

## 2023-07-22 DIAGNOSIS — N3001 Acute cystitis with hematuria: Secondary | ICD-10-CM

## 2023-07-22 DIAGNOSIS — Z95818 Presence of other cardiac implants and grafts: Secondary | ICD-10-CM

## 2023-07-22 LAB — POC URINALSYSI DIPSTICK (AUTOMATED)
Bilirubin, UA: NEGATIVE
Glucose, UA: POSITIVE — AB
Ketones, UA: NEGATIVE
Nitrite, UA: POSITIVE
Protein, UA: POSITIVE — AB
Spec Grav, UA: 1.025 (ref 1.010–1.025)
Urobilinogen, UA: 0.2 U/dL
pH, UA: 5.5 (ref 5.0–8.0)

## 2023-07-22 LAB — URINALYSIS, ROUTINE W REFLEX MICROSCOPIC
Bilirubin Urine: NEGATIVE
Ketones, ur: NEGATIVE
Nitrite: POSITIVE — AB
Specific Gravity, Urine: >=1.03 — AB (ref 1.000–1.030)
Total Protein, Urine: 30 — AB
Urine Glucose: >=1000 — AB
Urobilinogen, UA: 2 — AB (ref 0.0–1.0)
pH: 6 (ref 5.0–8.0)

## 2023-07-22 MED ORDER — SULFAMETHOXAZOLE-TRIMETHOPRIM 800-160 MG PO TABS: 1.0000 | ORAL_TABLET | Freq: Two times a day (BID) | ORAL | 0 refills | Status: DC

## 2023-07-22 NOTE — Telephone Encounter (Signed)
Today at 1120 or 1140 is fine

## 2023-07-22 NOTE — Telephone Encounter (Signed)
Spoke to pt husband, appt at 11:20am with Dr. Milinda Cave

## 2023-07-22 NOTE — Progress Notes (Signed)
OFFICE VISIT  07/22/2023  CC:  Chief Complaint  Patient presents with   Urinary Frequency    Pt states she has uncontrollable urinary frequency. Pt states she stands up and cannot stop the urge to urinate. A burning, painful sensation started yesterday morning. Pt has tried AZO as well as cranberry juice and nothing has given pt relief.      Patient is a 77 y.o. female who presents for urinary concerns.  HPI: Onset 8 days ago of urinary urgency and frequency, some urge incontinence.  Mild voiding discomfort.  General malaise.  No fever.  No flank pain.  No gross hematuria. Took Azo x 1 dose about 2 to 3 days ago.  She reports her glucoses at home have been very good.  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 ? to get liver bx if she gets bariatric surgery? Mild portal hypertensive gastropathy on EGD 08/2019.   Fibromyalgia    GAD (generalized anxiety disorder)    GERD    History of hiatal hernia    History of iron deficiency anemia 12/2018   Inadequate absorption secondary to chronic/long term PPI therapy + portal hypertensive gastroduodenopathy. No GIB found on EGD, colonosc, and givens. Iron infusions X multiple.   History of thrombocytopenia 12/2011   Hyperlipidemia    Intolerant of statins   HYPERTENSION    IBS (irritable bowel syndrome)    -D.  Good response to bentyl and imodium as of 06/2018 GI f/u.   IDDM (insulin dependent diabetes mellitus)    with DPN (managed by Dr. Elvera Lennox but then in 2018 pt preferred to have me manage for her convenience)   Limited mobility    Requires a walker for arthritic pain, widespread musculoskeletal pain, and neuropathic pain.   Morbid obesity (HCC)    As of 11/2018, pt considering sleeve gastectomy vs bipass as of eval by Dr. Alvino Blood considering as of 12/2019.   Nonalcoholic  steatohepatitis    Viral Hep screens NEG.  CT 2015.  Transaminasemia.  U/S 07/2018 showed early changes of cirrhosis.   OSA (obstructive sleep apnea) 09/14/2015   sleep study 09/07/15: severe obstructive sleep apnea with an AHI of 72 and SaO2 low of 75%.>referred to sleep MD   Osteoarthritis    hips, shoulders, knees   PAF (paroxysmal atrial fibrillation) (HCC)    One documented episode (after getting EGD 2016).  Was on amiodarone x 3 mo.  Rate control with metoprolol + anticoag with xarelto. Watchman 12/2022, off anticoag   Recurrent epistaxis    Granuloma in L nare cauterized by ENT 04/2020. Another cautery 06/2020   Small fiber neuropathy    Due to DM.  Symmetric hands and feet tingling/numbness.   Ulcerative colitis (HCC)    Remicade infusion Q 8 weeks: in clinical and endoscopic remission as of 12/2018 GI f/u.  06/11/19 rpt colonoscopy->cecal and ascending colon colitis.    Past Surgical History:  Procedure Laterality Date   ABDOMINAL HYSTERECTOMY  1980   Paps no longer indicated.   BACK SURGERY     BACTERIAL OVERGROWTH TEST N/A 07/13/2015   Procedure: BACTERIAL OVERGROWTH TEST;  Surgeon: Malissa Hippo, MD;  Location: [facility];  Service: Endoscopy;  Laterality: N/A;  730     BILATERAL SALPINGOOPHORECTOMY  02/10/2001   BIOPSY  06/11/2019   Procedure: BIOPSY;  Surgeon: Karilyn Cota,  Joline Maxcy, MD;  Location: [facility];  Service: Endoscopy;;  colon   BIOPSY  09/27/2019   Procedure: BIOPSY;  Surgeon: Malissa Hippo, MD;  [facility]: [facility];  Service: Endoscopy;;  gastric duodenal   BIOPSY  09/15/2020   Procedure: BIOPSY;  Surgeon: Dolores Frame, MD;  Location: [facility];  Service: Gastroenterology;;   BREAST REDUCTION SURGERY  1994   bilat   BREAST SURGERY     CARDIOVASCULAR STRESS TEST  07/2010   Lexiscan myoview: normal   CARPAL TUNNEL RELEASE Right 1996   CARPAL TUNNEL RELEASE Left 03/21/2003   CARPAL TUNNEL RELEASE Right 05/04/2013   Procedure:  CARPAL TUNNEL RELEASE;  Surgeon: Wyn Forster., MD;  Location: [facility];  Service: Orthopedics;  Laterality: Right;   CARPAL TUNNEL RELEASE Left 09/21/2013   Procedure: LEFT CARPAL TUNNEL RELEASE;  Surgeon: Wyn Forster., MD;  Location: [facility];  Service: Orthopedics;  Laterality: Left;   CHOLECYSTECTOMY     COLONOSCOPY WITH PROPOFOL N/A 08/04/2015   Colitis in remission.  No polyps.  Procedure: COLONOSCOPY WITH PROPOFOL;  Surgeon: Malissa Hippo, MD;  Location: [facility];  Service: Endoscopy;  Laterality: N/A;  cecum time in  0820   time out  0827    total time 7 minutes   COLONOSCOPY WITH PROPOFOL N/A 06/11/2019   cecal and ascending colon colitis.  Procedure: COLONOSCOPY WITH PROPOFOL;  Surgeon: Malissa Hippo, MD;  [facility]: [facility];  Service: Endoscopy;  Laterality: N/A;  730a   COLONOSCOPY WITH PROPOFOL N/A 09/15/2020   Procedure: COLONOSCOPY WITH PROPOFOL;  Surgeon: Dolores Frame, MD;  [facility]: [facility];  Service: Gastroenterology;  Laterality: N/A;  1030   ESOPHAGEAL DILATION N/A 08/04/2015   Procedure: ESOPHAGEAL DILATION;  Surgeon: Malissa Hippo, MD;  Location: [facility];  Service: Endoscopy;  Laterality: N/ALauralee Evener, no mucousal disruption   ESOPHAGOGASTRODUODENOSCOPY  09/27/2019   Performed for IDA.  Esoph dilation was done but no stricture present.  Mild portal hypertensive gastropathy, o/w normal.  Duodenal bx NEG.  h pylori neg.   ESOPHAGOGASTRODUODENOSCOPY (EGD) WITH ESOPHAGEAL DILATION  12/02/2005   ESOPHAGOGASTRODUODENOSCOPY (EGD) WITH PROPOFOL N/A 08/04/2015   Procedure: ESOPHAGOGASTRODUODENOSCOPY (EGD) WITH PROPOFOL;  Surgeon: Malissa Hippo, MD;  Location: [facility];  Service: Endoscopy;  Laterality: N/A;  procedure 1   ESOPHAGOGASTRODUODENOSCOPY (EGD) WITH PROPOFOL N/A 09/27/2019   Procedure: ESOPHAGOGASTRODUODENOSCOPY (EGD) WITH PROPOFOL;  Surgeon: Malissa Hippo, MD;  [facility];   Service: Endoscopy;  Laterality: N/A;  12:10   ESOPHAGOGASTRODUODENOSCOPY (EGD) WITH PROPOFOL N/A 10/31/2021   Procedure: ESOPHAGOGASTRODUODENOSCOPY (EGD) WITH PROPOFOL;  Surgeon: Malissa Hippo, MD;  [facility];  Service: Endoscopy;  Laterality: N/A;  930   FLEXIBLE SIGMOIDOSCOPY  01/17/2012   Procedure: FLEXIBLE SIGMOIDOSCOPY;  Surgeon: Malissa Hippo, MD;  [facility];  Service: Endoscopy;  Laterality: N/A;   GIVENS CAPSULE STUDY N/A 08/03/2019   Procedure: GIVENS CAPSULE STUDY (performed for IDA)->some food debris in stomach and small amount of blood.  Surgeon: Malissa Hippo, MD;  [facility]: [facility];  Service: Endoscopy;  Laterality: N/A;  730AM   HEMILAMINOTOMY LUMBAR SPINE Bilateral 09/07/1999   L4-5   KNEE ARTHROSCOPY Right 01/1999; 10/2000   LEFT ATRIAL APPENDAGE OCCLUSION N/A 01/23/2023   Procedure: LEFT ATRIAL APPENDAGE OCCLUSION;  Surgeon: [name], MD;  [facility];  Service: Cardiovascular;  Laterality: N/A;  LYSIS OF ADHESION  02/10/2001   MALONEY DILATION  09/27/2019   Procedure: Elease Hashimoto DILATION;  Surgeon: Malissa Hippo, MD;  [facility]: [facility];  Service: Endoscopy;;   NASAL ENDOSCOPY WITH EPISTAXIS CONTROL Bilateral 01/25/2022   Procedure: NASAL ENDOSCOPY WITH EPISTAXIS CONTROL;  Surgeon: Osborn Coho, MD;  Location: [facility];  Service: ENT;  Laterality: Bilateral;   NASAL SEPTOPLASTY W/ TURBINOPLASTY Bilateral 01/25/2022   Procedure: NASAL SEPTOPLASTY WITH TURBINATE REDUCTION;  Surgeon: Osborn Coho, MD;  Location: [facility];  Service: ENT;  Laterality: Bilateral;   RECTOCELE REPAIR  1990; 09/12/2006   TARSAL TUNNEL RELEASE  2002   TEE WITHOUT CARDIOVERSION N/A 01/23/2023   Procedure: TRANSESOPHAGEAL ECHOCARDIOGRAM;  Surgeon: [name], MD;  Location: [facility] INVASIVE CV LAB;  Service: Cardiovascular;  Laterality: N/A;   TRANSTHORACIC ECHOCARDIOGRAM  08/04/2015   EF 60-65%, normal wall motion, mild LVH, mild LA  dilation, grd I DD.   TUMOR EXCISION Left 03/21/2003   dorsal 1st web space (hand)   URETEROLYSIS Right 02/10/2001    Outpatient Medications Prior to Visit  Medication Sig Dispense Refill   Accu-Chek Softclix Lancets lancets USE TO CHECK BLOOD GLUCOSE UP TO 6 TIMES DAILY AS DIRECTED 200 each 5   acetaminophen (TYLENOL) 325 MG tablet Take 650 mg by mouth every 6 (six) hours as needed for moderate pain or headache.     blood glucose meter kit and supplies KIT Use up to six times daily as directed. DX. E11.9 1 each 0   canagliflozin (INVOKANA) 100 MG TABS tablet Take 1 tablet (100 mg total) by mouth daily before breakfast. 90 tablet 1   citalopram (CELEXA) 20 MG tablet Take 1 tablet (20 mg total) by mouth daily. 90 tablet 1   clopidogrel (PLAVIX) 75 MG tablet Take 1 tablet (75 mg total) by mouth daily. START ON 6/3 90 tablet 1   dicyclomine (BENTYL) 10 MG capsule TAKE 1 CAPSULE (10 MG TOTAL) BY MOUTH EVERY 8 (EIGHT) HOURS AS NEEDED FOR SPASMS (ABDOMINAL PAIN). 270 capsule 1   diphenoxylate-atropine (LOMOTIL) 2.5-0.025 MG tablet Take 1 tablet by mouth 4 (four) times daily as needed for diarrhea or loose stools. 60 tablet 2   Dulaglutide (TRULICITY) 1.5 MG/0.5ML SOPN INJECT 1.5MG  ONCE WEEKLY 18 mL 3   esomeprazole (NEXIUM) 20 MG capsule TAKE 1 CAPSULE BY MOUTH DAILY BEFORE BREAKFAST 90 capsule 3   ferric carboxymaltose (INJECTAFER) 750 MG/15ML SOLN injection Inject 15 mLs (750 mg total) into the vein as directed for 1 dose. Repeat in 7 days 15 mL 1   furosemide (LASIX) 40 MG tablet Take 1 tablet (40 mg total) by mouth daily. 90 tablet 1   gabapentin (NEURONTIN) 600 MG tablet TAKE 1 AND 1/2 TABLETS BY MOUTH TWICE A DAY. MUST KEEP APPT FOR FURTHER REFILLS 90 tablet 0   glucose blood (ACCU-CHEK GUIDE) test strip USE TO CHECK BLOOD GLUCOSE UP TO 6 TIMES DAILY AS DIRECTED 600 strip 3   INFLIXIMAB IV Inject into the vein every 8 (eight) weeks. Remicaid 5mg /kg Infusion every 8 weeks [facility]  Rheumatology)     Insulin Pen Needle (B-D UF III MINI PEN NEEDLES) 31G X 5 MM MISC USE TO INJECT INSULINS EQUAL TO 6 TIMES DAILY. 500 each 6   insulin regular human CONCENTRATED (HUMULIN R U-500 KWIKPEN) 500 UNIT/ML KwikPen Inject 100 units into the skin at breakfast and lunch. 48 mL 1   lactase (LACTAID) 3000 UNITS tablet Take 3,000 Units by mouth as needed (when eating foods containing  dairy).      meclizine (ANTIVERT) 25 MG tablet Take 1 tablet (25 mg total) by mouth 3 (three) times daily as needed for dizziness or nausea. 90 tablet 0   methocarbamol (ROBAXIN) 500 MG tablet Take 1 tablet (500 mg total) by mouth every 8 (eight) hours as needed for muscle spasms. 20 tablet 0   metoprolol succinate (TOPROL-XL) 100 MG 24 hr tablet TAKE 1 TABLET BY MOUTH 2 TIMES DAILY. TAKE WITH OR IMMEDIATELY FOLLOWING A MEAL 180 tablet 1   potassium chloride (KLOR-CON M10) 10 MEQ tablet Take 1 tablet (10 mEq total) by mouth daily. 90 tablet 1   No facility-administered medications prior to visit.    Allergies  Allergen Reactions   Omeprazole Anaphylaxis and Swelling    SWELLING OF TONGUE AND THROAT   Actos [Pioglitazone] Swelling and Other (See Comments)    Weight gain, tongue swelling    Benzocaine-Menthol Swelling    SWELLING OF MOUTH   Colesevelam Other (See Comments)    GI UPSET   Flagyl [Metronidazole Hcl] Other (See Comments)    DIAPHORESIS   Metformin And Related Diarrhea   Shrimp [Shellfish Allergy] Itching    OF THROAT AND EARS, if consumed raw   Statins Palpitations   Allevyn Adhesive [Wound Dressings]     Other reaction(s): Unknown   Desipramine Hcl Itching, Nausea Only and Other (See Comments)    "swimmy" headed, ears itched    Hydromorphone Itching   Jardiance [Empagliflozin] Other (See Comments)    weakness   Lactose Intolerance (Gi) Diarrhea    Gas, bloating   Adhesive [Tape] Other (See Comments)    SKIN IRRITATION AND BRUISING   Nisoldipine Itching   Percocet  [Oxycodone-Acetaminophen] Itching    Review of Systems  As per HPI  PE:    07/22/2023   11:26 AM 06/17/2023    8:04 PM 06/17/2023    5:30 PM  Vitals with BMI  Weight 225 lbs 3 oz    Systolic 129 141 106  Diastolic 75 66 61  Pulse 74 72 75     Physical Exam  Gen: Alert, tired-appearing.  NAD.  Patient is oriented to person, place, time, and situation. AFFECT: pleasant, lucid thought and speech.   LABS:  Last CBC Lab Results  Component Value Date   WBC 3.7 (L) 05/29/2023   HGB 8.8 Repeated and verified X2. (L) 05/29/2023   HCT 29.5 (L) 05/29/2023   MCV 79.8 05/29/2023   MCH 24.7 (L) 05/08/2023   RDW 18.0 (H) 05/29/2023   PLT 134.0 (L) 05/29/2023   Last metabolic panel Lab Results  Component Value Date   GLUCOSE 203 (H) 05/29/2023   NA 139 05/29/2023   K 3.5 05/29/2023   CL 101 05/29/2023   CO2 27 05/29/2023   BUN 8 05/29/2023   CREATININE 0.64 05/29/2023   GFR 86.23 05/29/2023   CALCIUM 8.5 05/29/2023   PROT 6.9 05/08/2023   ALBUMIN 3.3 (A) 02/25/2023   LABGLOB 3.4 08/17/2015   AGRATIO 1.0 08/17/2015   BILITOT 0.7 05/08/2023   ALKPHOS 108 02/25/2023   AST 29 05/08/2023   ALT 15 05/08/2023   ANIONGAP 5 10/10/2022   Last hemoglobin A1c Lab Results  Component Value Date   HGBA1C 8.5 (A) 05/29/2023   HGBA1C 8.5 05/29/2023   HGBA1C 8.5 (A) 05/29/2023   HGBA1C 8.5 (A) 05/29/2023   IMPRESSION AND PLAN:  Acute UTI. Dipstick urinalysis today: Urine is cloudy, dark yellow. 3+ glucose, specific gravity 1.025, 3+ blood, 1+  protein, nitrite positive, 1+ leukocyte.   Send urine out for urinalysis with microscopy.  Also urine culture. Start Bactrim double strength, 1 twice daily x 5 days.  An After Visit Summary was printed and given to the patient.  FOLLOW UP: Return if symptoms worsen or fail to improve.  Signed:  Santiago Bumpers, MD           07/22/2023

## 2023-07-22 NOTE — Telephone Encounter (Signed)
Called pt. Unable to leave VM. Ok to schedule pt today at 11:20.

## 2023-07-22 NOTE — Progress Notes (Unsigned)
[facility]                                     Cardiology Office Note:    Date:  07/23/2023   ID:  [name], [name] [birth year], MRN [id number]  PCP:  Jeoffrey Massed, MD  [facility] Cardiologist:  Olga Millers, MD  [facility] Electrophysiologist:  None   Referring MD: Jeoffrey Massed, MD   Chief Complaint  Patient presents with   Follow-up    6 month s/p LAAO    History of Present Illness:    [name] is a 77 y.o. female with a hx of recurrent epistaxis and PAF s/p LAAO with Watchman (01/22/23) who presents for 6 month follow up.     [name] follows with Dr. Jens Som for her cardiology care and reported daily nosebleeds while taking Xarelto for her atrial fibrillation. She underwent successful implantation of a 24 mm Watchman FLX Pro device with Dr. Excell Seltzer on 01/22/23. She was initially resumed on Xarelto and has since transitioned to Plavix. Post procedure CT showed a small 3-4mm device leak therefore the plan was for repeat CT at 6 months.    Today she is here with her husband and reports that overall she has been well. Her nosebleeds have almost stopped at this point. Otherwise she denies chest pain, SOB, palpitations, LE edema, orthopnea, PND, dizziness, or syncope. Denies bleeding in stool or urine.    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 ? to get liver bx if she gets bariatric surgery? Mild portal hypertensive gastropathy on EGD 08/2019.   Fibromyalgia    GAD (generalized anxiety disorder)    GERD    History of hiatal hernia    History of iron deficiency anemia 12/2018   Inadequate absorption secondary to chronic/long term PPI therapy + portal hypertensive gastroduodenopathy. No GIB found on EGD, colonosc, and givens. Iron infusions X multiple.   History of thrombocytopenia  12/2011   Hyperlipidemia    Intolerant of statins   HYPERTENSION    IBS (irritable bowel syndrome)    -D.  Good response to bentyl and imodium as of 06/2018 GI f/u.   IDDM (insulin dependent diabetes mellitus)    with DPN (managed by Dr. Elvera Lennox but then in 2018 pt preferred to have me manage for her convenience)   Limited mobility    Requires a walker for arthritic pain, widespread musculoskeletal pain, and neuropathic pain.   Morbid obesity (HCC)    As of 11/2018, pt considering sleeve gastectomy vs bipass as of eval by Dr. Alvino Blood considering as of 12/2019.   Nonalcoholic steatohepatitis    Viral Hep screens NEG.  CT 2015.  Transaminasemia.  U/S 07/2018 showed early changes of cirrhosis.   OSA (obstructive sleep apnea) 09/14/2015   sleep study 09/07/15: severe obstructive sleep apnea with an AHI of 72 and SaO2 low of 75%.>referred to sleep MD   Osteoarthritis    hips, shoulders, knees   PAF (paroxysmal atrial fibrillation) (HCC)    One documented episode (after getting EGD 2016).  Was on amiodarone x 3 mo.  Rate control with metoprolol + anticoag with xarelto. Watchman 12/2022, off anticoag   Recurrent epistaxis    Granuloma in L nare  cauterized by ENT 04/2020. Another cautery 06/2020   Small fiber neuropathy    Due to DM.  Symmetric hands and feet tingling/numbness.   Ulcerative colitis (HCC)    Remicade infusion Q 8 weeks: in clinical and endoscopic remission as of 12/2018 GI f/u.  06/11/19 rpt colonoscopy->cecal and ascending colon colitis.    Past Surgical History:  Procedure Laterality Date   ABDOMINAL HYSTERECTOMY  1980   Paps no longer indicated.   BACK SURGERY     BACTERIAL OVERGROWTH TEST N/A 07/13/2015   Procedure: BACTERIAL OVERGROWTH TEST;  Surgeon: Malissa Hippo, MD;  [facility];  Service: Endoscopy;  Laterality: N/A;  730     BILATERAL SALPINGOOPHORECTOMY  02/10/2001   BIOPSY  06/11/2019   Procedure: BIOPSY;  Surgeon: Malissa Hippo, MD;   [facility];  Service: Endoscopy;;  colon   BIOPSY  09/27/2019   Procedure: BIOPSY;  Surgeon: Malissa Hippo, MD;  [facility];  Service: Endoscopy;;  gastric duodenal   BIOPSY  09/15/2020   Procedure: BIOPSY;  Surgeon: Dolores Frame, MD;  [facility];  Service: Gastroenterology;;   BREAST REDUCTION SURGERY  1994   bilat   BREAST SURGERY     CARDIOVASCULAR STRESS TEST  07/2010   Lexiscan myoview: normal   CARPAL TUNNEL RELEASE Right 1996   CARPAL TUNNEL RELEASE Left 03/21/2003   CARPAL TUNNEL RELEASE Right 05/04/2013   Procedure: CARPAL TUNNEL RELEASE;  Surgeon: Wyn Forster., MD;  Location: [facility];  Service: Orthopedics;  Laterality: Right;   CARPAL TUNNEL RELEASE Left 09/21/2013   Procedure: LEFT CARPAL TUNNEL RELEASE;  Surgeon: Wyn Forster., MD;  Location: [facility];  Service: Orthopedics;  Laterality: Left;   CHOLECYSTECTOMY     COLONOSCOPY WITH PROPOFOL N/A 08/04/2015   Colitis in remission.  No polyps.  Procedure: COLONOSCOPY WITH PROPOFOL;  Surgeon: Malissa Hippo, MD;  [facility]: [facility];  Service: Endoscopy;  Laterality: N/A;  cecum time in  0820   time out  0827    total time 7 minutes   COLONOSCOPY WITH PROPOFOL N/A 06/11/2019   cecal and ascending colon colitis.  Procedure: COLONOSCOPY WITH PROPOFOL;  Surgeon: Malissa Hippo, MD;  [facility];  Service: Endoscopy;  Laterality: N/A;  730a   COLONOSCOPY WITH PROPOFOL N/A 09/15/2020   Procedure: COLONOSCOPY WITH PROPOFOL;  Surgeon: Dolores Frame, MD;  [facility];  Service: Gastroenterology;  Laterality: N/A;  1030   ESOPHAGEAL DILATION N/A 08/04/2015   Procedure: ESOPHAGEAL DILATION;  Surgeon: Malissa Hippo, MD;  [facility]: [facility];  Service: Endoscopy;  Laterality: N/ALauralee Evener, no mucousal disruption   ESOPHAGOGASTRODUODENOSCOPY  09/27/2019   Performed for IDA.  Esoph dilation was done but  no stricture present.  Mild portal hypertensive gastropathy, o/w normal.  Duodenal bx NEG.  h pylori neg.   ESOPHAGOGASTRODUODENOSCOPY (EGD) WITH ESOPHAGEAL DILATION  12/02/2005   ESOPHAGOGASTRODUODENOSCOPY (EGD) WITH PROPOFOL N/A 08/04/2015   Procedure: ESOPHAGOGASTRODUODENOSCOPY (EGD) WITH PROPOFOL;  Surgeon: Malissa Hippo, MD;  [facility];  Service: Endoscopy;  Laterality: N/A;  procedure 1   ESOPHAGOGASTRODUODENOSCOPY (EGD) WITH PROPOFOL N/A 09/27/2019   Procedure: ESOPHAGOGASTRODUODENOSCOPY (EGD) WITH PROPOFOL;  Surgeon: Malissa Hippo, MD;  [facility];  Service: Endoscopy;  Laterality: N/A;  12:10   ESOPHAGOGASTRODUODENOSCOPY (EGD) WITH PROPOFOL N/A 10/31/2021   Procedure: ESOPHAGOGASTRODUODENOSCOPY (EGD) WITH PROPOFOL;  Surgeon: Malissa Hippo,  MD;  [facility];  Service: Endoscopy;  Laterality: N/A;  930   FLEXIBLE SIGMOIDOSCOPY  01/17/2012   Procedure: FLEXIBLE SIGMOIDOSCOPY;  Surgeon: Malissa Hippo, MD;  [facility];  Service: Endoscopy;  Laterality: N/A;   GIVENS CAPSULE STUDY N/A 08/03/2019   Procedure: GIVENS CAPSULE STUDY (performed for IDA)->some food debris in stomach and small amount of blood.  Surgeon: Malissa Hippo, MD;  [facility];  Service: Endoscopy;  Laterality: N/A;  730AM   HEMILAMINOTOMY LUMBAR SPINE Bilateral 09/07/1999   L4-5   KNEE ARTHROSCOPY Right 01/1999; 10/2000   LEFT ATRIAL APPENDAGE OCCLUSION N/A 01/23/2023   Procedure: LEFT ATRIAL APPENDAGE OCCLUSION;  Surgeon: [name], MD;  Location: [facility];  Service: Cardiovascular;  Laterality: N/A;   LYSIS OF ADHESION  02/10/2001   MALONEY DILATION  09/27/2019   Procedure: Elease Hashimoto DILATION;  Surgeon: Malissa Hippo, MD;  [facility];  Service: Endoscopy;;   NASAL ENDOSCOPY WITH EPISTAXIS CONTROL Bilateral 01/25/2022   Procedure: NASAL ENDOSCOPY WITH EPISTAXIS CONTROL;  Surgeon: Osborn Coho, MD;  Location: [facility];  Service:  ENT;  Laterality: Bilateral;   NASAL SEPTOPLASTY W/ TURBINOPLASTY Bilateral 01/25/2022   Procedure: NASAL SEPTOPLASTY WITH TURBINATE REDUCTION;  Surgeon: Osborn Coho, MD;  Location: [facility] OR;  Service: ENT;  Laterality: Bilateral;   RECTOCELE REPAIR  1990; 09/12/2006   TARSAL TUNNEL RELEASE  2002   TEE WITHOUT CARDIOVERSION N/A 01/23/2023   Procedure: TRANSESOPHAGEAL ECHOCARDIOGRAM;  Surgeon: [name], MD;  Location: [facility]VAS[facility] CV LAB;  Service: Cardiovascular;  Laterality: N/A;   TRANSTHORACIC ECHOCARDIOGRAM  08/04/2015   EF 60-65%, normal wall motion, mild LVH, mild LA dilation, grd I DD.   TUMOR EXCISION Left 03/21/2003   dorsal 1st web space (hand)   URETEROLYSIS Right 02/10/2001    Current Medications: Current Meds  Medication Sig   Accu-Chek Softclix Lancets lancets USE TO CHECK BLOOD GLUCOSE UP TO 6 TIMES DAILY AS DIRECTED   acetaminophen (TYLENOL) 325 MG tablet Take 650 mg by mouth every 6 (six) hours as needed for moderate pain or headache.   blood glucose meter kit and supplies KIT Use up to six times daily as directed. DX. E11.9   canagliflozin (INVOKANA) 100 MG TABS tablet Take 1 tablet (100 mg total) by mouth daily before breakfast.   citalopram (CELEXA) 20 MG tablet Take 1 tablet (20 mg total) by mouth daily.   clopidogrel (PLAVIX) 75 MG tablet Take 1 tablet (75 mg total) by mouth daily. START ON 6/3   dicyclomine (BENTYL) 10 MG capsule TAKE 1 CAPSULE (10 MG TOTAL) BY MOUTH EVERY 8 (EIGHT) HOURS AS NEEDED FOR SPASMS (ABDOMINAL PAIN).   diphenoxylate-atropine (LOMOTIL) 2.5-0.025 MG tablet Take 1 tablet by mouth 4 (four) times daily as needed for diarrhea or loose stools.   Dulaglutide (TRULICITY) 1.5 MG/0.5ML SOPN INJECT 1.5MG  ONCE WEEKLY   esomeprazole (NEXIUM) 20 MG capsule TAKE 1 CAPSULE BY MOUTH DAILY BEFORE BREAKFAST   ferric carboxymaltose (INJECTAFER) 750 MG/15ML SOLN injection Inject 15 mLs (750 mg total) into the vein as directed for 1 dose. Repeat in 7 days    furosemide (LASIX) 40 MG tablet Take 1 tablet (40 mg total) by mouth daily.   gabapentin (NEURONTIN) 600 MG tablet TAKE 1 AND 1/2 TABLETS BY MOUTH TWICE A DAY. MUST KEEP APPT FOR FURTHER REFILLS   glucose blood (ACCU-CHEK GUIDE) test strip USE TO CHECK BLOOD GLUCOSE UP TO 6 TIMES DAILY AS DIRECTED  INFLIXIMAB IV Inject into the vein every 8 (eight) weeks. Remicaid 5mg /kg Infusion every 8 weeks [facility])   Insulin Pen Needle (B-D UF III MINI PEN NEEDLES) 31G X 5 MM MISC USE TO INJECT INSULINS EQUAL TO 6 TIMES DAILY.   insulin regular human CONCENTRATED (HUMULIN R U-500 KWIKPEN) 500 UNIT/ML KwikPen Inject 100 units into the skin at breakfast and lunch.   lactase (LACTAID) 3000 UNITS tablet Take 3,000 Units by mouth as needed (when eating foods containing dairy).    meclizine (ANTIVERT) 25 MG tablet Take 1 tablet (25 mg total) by mouth 3 (three) times daily as needed for dizziness or nausea.   methocarbamol (ROBAXIN) 500 MG tablet Take 1 tablet (500 mg total) by mouth every 8 (eight) hours as needed for muscle spasms.   metoprolol succinate (TOPROL-XL) 100 MG 24 hr tablet TAKE 1 TABLET BY MOUTH 2 TIMES DAILY. TAKE WITH OR IMMEDIATELY FOLLOWING A MEAL   potassium chloride (KLOR-CON M10) 10 MEQ tablet Take 1 tablet (10 mEq total) by mouth daily.   sulfamethoxazole-trimethoprim (BACTRIM DS) 800-160 MG tablet Take 1 tablet by mouth 2 (two) times daily.     Allergies:   Omeprazole, Actos [pioglitazone], Benzocaine-menthol, Colesevelam, Flagyl [metronidazole hcl], Metformin and related, Shrimp [shellfish allergy], Statins, Allevyn adhesive [wound dressings], Desipramine hcl, Hydromorphone, Jardiance [empagliflozin], Lactose intolerance (gi), Adhesive [tape], Nisoldipine, and Percocet [oxycodone-acetaminophen]   Social History   Socioeconomic History   Marital status: Married    Spouse name: Not on file   Number of children: Not on file   Years of education: Not on file   Highest  education level: Not on file  Occupational History   Occupation: Retired  Tobacco Use   Smoking status: Never   Smokeless tobacco: Never  Vaping Use   Vaping status: Never Used  Substance and Sexual Activity   Alcohol use: Not Currently    Comment: ocassionally   Drug use: No   Sexual activity: Not Currently    Partners: Male    Birth control/protection: Surgical    Comment: hysterectomy  Other Topics Concern   Not on file  Social History Narrative   She lives with husband in [place] home.  They have one grown son and 2 grandchildren.   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   Highest of level education:  Some college.   Never smoker.   Alcohol: rare.   Social Determinants of Health   Financial Resource Strain: Low Risk  (01/15/2023)   Overall Financial Resource Strain (CARDIA)    Difficulty of Paying Living Expenses: Not hard at all  Food Insecurity: No Food Insecurity (01/27/2023)   Hunger Vital Sign    Worried About Running Out of Food in the Last Year: Never true    Ran Out of Food in the Last Year: Never true  Transportation Needs: No Transportation Needs (01/27/2023)   PRAPARE - Administrator, Civil Service (Medical): No    Lack of Transportation (Non-Medical): No  Physical Activity: Inactive (01/15/2023)   Exercise Vital Sign    Days of Exercise per Week: 0 days    Minutes of Exercise per Session: 0 min  Stress: No Stress Concern Present (01/15/2023)   [employer] of Occupational Health - Occupational Stress Questionnaire    Feeling of Stress : Not at all  Social Connections: Moderately Isolated (01/15/2023)   Social Connection and Isolation Panel [NHANES]    Frequency of Communication with Friends and Family: Three times a week  Frequency of Social Gatherings with Friends and Family: Once a week    Attends Religious Services: Never    Database administrator or Organizations: No    Attends Engineer, structural: Not on file    Marital  Status: Married     Family History: The patient's family history includes Alcohol abuse in an other family member; Asthma in her maternal grandfather; Colon cancer in her paternal grandfather; Diabetes in her mother and son; Emphysema in her maternal grandfather; Heart attack in her father; Heart disease in her father; Hypertension in her mother and son; Lung disease in her father; Stomach cancer in her paternal grandmother. There is no history of Neuropathy.  ROS:   Please see the history of present illness.    All other systems reviewed and are negative.  EKG:  EKG is not ordered today.    Recent Labs: 10/09/2022: Magnesium 2.2; TSH 0.618 05/08/2023: ALT 15 05/29/2023: BUN 8; Creatinine, Ser 0.64; Hemoglobin 8.8 Repeated and verified X2.; Platelets 134.0; Potassium 3.5; Sodium 139   Recent Lipid Panel    Component Value Date/Time   CHOL 149 05/06/2023 1450   TRIG 123 05/06/2023 1450   TRIG 196 01/26/2010 0000   HDL 38 (L) 05/06/2023 1450   CHOLHDL 3.9 05/06/2023 1450   CHOLHDL 5 08/28/2022 1019   VLDL 27.8 08/28/2022 1019   LDLCALC 89 05/06/2023 1450   LDLDIRECT 69.5 01/24/2011 1125   Risk Assessment/Calculations:    CHA2DS2-VASc Score = 5   This indicates a 7.2% annual risk of stroke. The patient's score is based upon: CHF History: 0 HTN History: 1 Diabetes History: 1 Stroke History: 0 Vascular Disease History: 0 Age Score: 2 Gender Score: 1  Physical Exam:    VS:  BP 108/60   Pulse 84   Ht 5\' 2"  (1.575 m)   Wt 225 lb (102.1 kg)   SpO2 94%   BMI 41.15 kg/m     Wt Readings from Last 3 Encounters:  07/23/23 225 lb (102.1 kg)  07/22/23 225 lb 3.2 oz (102.2 kg)  06/17/23 233 lb (105.7 kg)    General: Well developed, well nourished, NAD Lungs:Clear to ausculation bilaterally. No wheezes, rales, or rhonchi. Breathing is unlabored. Cardiovascular: RRR with S1 S2. No murmurs Extremities: No edema.  Neuro: Alert and oriented. No focal deficits. No facial asymmetry.  MAE spontaneously. Psych: Responds to questions appropriately with normal affect.    ASSESSMENT/PLAN:    PAF: s/p LAAO with Watchman. She may now stop her Plavix given duration since her procedure. Additionally she no longer requires dental SBE. There was a small device leak noted on post op CT therefore she is scheduled for repeat today. She was noted to have an elevated calcium score therefore will replace Plavix with daily ASA 81mg . Plan to touch base with our team at one year and Dr. Jens Som in the interm.    Elevated coronary calcium score: Start ASA 81mg  daily. Denies anginal symptoms.     Medication Adjustments/Labs and Tests Ordered: Current medicines are reviewed at length with the patient today.  Concerns regarding medicines are outlined above.  No orders of the defined types were placed in this encounter.  No orders of the defined types were placed in this encounter.   Patient Instructions  Medication Instructions:  Your physician has recommended you make the following change in your medication: Stop Clopidogrel Start aspirin 81 mg by mouth daily   *If you need a refill on your cardiac medications before  your next appointment, please call your pharmacy*   Lab Work: none If you have labs (blood work) drawn today and your tests are completely normal, you will receive your results only by: MyChart Message (if you have MyChart) OR A paper copy in the mail If you have any lab test that is abnormal or we need to change your treatment, we will call you to review the results.   Testing/Procedures: CT Scan to be done today   Follow-Up: At [facility], you and your health needs are our priority.  As part of our continuing mission to provide you with exceptional heart care, we have created designated Provider Care Teams.  These Care Teams include your primary Cardiologist (physician) and Advanced Practice Providers (APPs -  Physician Assistants and Nurse  Practitioners) who all work together to provide you with the care you need, when you need it.  We recommend signing up for the patient portal called "MyChart".  Sign up information is provided on this After Visit Summary.  MyChart is used to connect with patients for Virtual Visits (Telemedicine).  Patients are able to view lab/test results, encounter notes, upcoming appointments, etc.  Non-urgent messages can be sent to your provider as well.   To learn more about what you can do with MyChart, go to [email or web address].    Your next appointment:   August 2025  Provider:   Olga Millers, MD     Other Instructions      Signed, Georgie Chard, NP  07/23/2023 11:34 AM    [facility]

## 2023-07-23 ENCOUNTER — Encounter (HOSPITAL_COMMUNITY): Payer: Self-pay

## 2023-07-23 ENCOUNTER — Ambulatory Visit: Payer: Medicare PPO | Admitting: Cardiology

## 2023-07-23 ENCOUNTER — Ambulatory Visit (HOSPITAL_COMMUNITY)
Admission: RE | Admit: 2023-07-23 | Discharge: 2023-07-23 | Disposition: A | Discharge status: Home / Self Care | Payer: Medicare PPO | Source: Ambulatory Visit | Attending: Family Medicine | Admitting: Family Medicine

## 2023-07-23 VITALS — BP 108/60 | HR 84 | Ht 62.0 in | Wt 225.0 lb

## 2023-07-23 DIAGNOSIS — Z95818 Presence of other cardiac implants and grafts: Secondary | ICD-10-CM | POA: Diagnosis not present

## 2023-07-23 DIAGNOSIS — R931 Abnormal findings on diagnostic imaging of heart and coronary circulation: Secondary | ICD-10-CM | POA: Insufficient documentation

## 2023-07-23 DIAGNOSIS — I119 Hypertensive heart disease without heart failure: Secondary | ICD-10-CM | POA: Diagnosis not present

## 2023-07-23 DIAGNOSIS — I480 Paroxysmal atrial fibrillation: Secondary | ICD-10-CM | POA: Diagnosis not present

## 2023-07-23 LAB — POCT I-STAT CREATININE: Creatinine, Ser: 0.8 mg/dL (ref 0.44–1.00)

## 2023-07-23 MED ORDER — IOHEXOL 350 MG/ML SOLN
95.0000 mL | Freq: Once | INTRAVENOUS | Status: AC | PRN
Start: 2023-07-23 — End: 2023-07-23
  Administered 2023-07-23: 95 mL via INTRAVENOUS

## 2023-07-23 MED ORDER — ASPIRIN 81 MG PO TBEC: 81.0000 mg | DELAYED_RELEASE_TABLET | Freq: Every day | ORAL | Status: DC

## 2023-07-23 NOTE — Patient Instructions (Signed)
Medication Instructions:  Your physician has recommended you make the following change in your medication: Stop Clopidogrel Start aspirin 81 mg by mouth daily   *If you need a refill on your cardiac medications before your next appointment, please call your pharmacy*   Lab Work: none If you have labs (blood work) drawn today and your tests are completely normal, you will receive your results only by: MyChart Message (if you have MyChart) OR A paper copy in the mail If you have any lab test that is abnormal or we need to change your treatment, we will call you to review the results.   Testing/Procedures: CT Scan to be done today   Follow-Up: At [facility], you and your health needs are our priority.  As part of our continuing mission to provide you with exceptional heart care, we have created designated Provider Care Teams.  These Care Teams include your primary Cardiologist (physician) and Advanced Practice Providers (APPs -  Physician Assistants and Nurse Practitioners) who all work together to provide you with the care you need, when you need it.  We recommend signing up for the patient portal called "MyChart".  Sign up information is provided on this After Visit Summary.  MyChart is used to connect with patients for Virtual Visits (Telemedicine).  Patients are able to view lab/test results, encounter notes, upcoming appointments, etc.  Non-urgent messages can be sent to your provider as well.   To learn more about what you can do with MyChart, go to [email or web address].    Your next appointment:   August 2025  Provider:   Olga Millers, MD     Other Instructions

## 2023-07-24 ENCOUNTER — Other Ambulatory Visit: Payer: Self-pay | Admitting: Family Medicine

## 2023-07-25 LAB — URINE CULTURE
MICRO NUMBER:: 15626793
SPECIMEN QUALITY:: ADEQUATE

## 2023-07-26 ENCOUNTER — Other Ambulatory Visit: Payer: Self-pay | Admitting: Family Medicine

## 2023-07-31 DIAGNOSIS — M25551 Pain in right hip: Secondary | ICD-10-CM | POA: Diagnosis not present

## 2023-07-31 DIAGNOSIS — M25552 Pain in left hip: Secondary | ICD-10-CM | POA: Diagnosis not present

## 2023-08-03 ENCOUNTER — Other Ambulatory Visit: Payer: Self-pay | Admitting: Family Medicine

## 2023-08-03 ENCOUNTER — Other Ambulatory Visit: Payer: Self-pay | Admitting: Physician Assistant

## 2023-08-05 ENCOUNTER — Telehealth: Payer: Self-pay

## 2023-08-05 NOTE — Telephone Encounter (Signed)
Patient still having urinary issues, patient was started on Zpack on Friday and having some issues with medication.  She states she doesn't know what to do anymore.  She can't sleep at night with urinary issues. She is aware Dr. Milinda Cave return to office tomorrow.   Please advise.

## 2023-08-05 NOTE — Telephone Encounter (Signed)
[name] 6 month post-LAAO CT was reviewed during the Structural Team meeting this AM.  Per Dr. Excell Seltzer, called to arrange follow-up to discuss results and potential treatment options.   The patient's mailbox is full and I was unable to leave message.  Will try again later.

## 2023-08-05 NOTE — Telephone Encounter (Signed)
-----   Message from [name] sent at 07/30/2023 10:55 AM EDT ----- Haig Prophet - can you look at the patient's CTA? If you look at the image labeled CTFLX it shows that there is an uncovered proximal lobe that I guess we didn't see on the procedural TEE. Looks to me to be significant and it communicates with the full depth of the appendage. She has pretty bad epistaxis but I'm inclined to recommend she try OAC again. Wonder your thoughts?  Thx Kathlene November ----- Message ----- From: Leory Plowman, Rad Results In Sent: 07/23/2023   2:05 PM EDT To: [name], MD

## 2023-08-06 NOTE — Telephone Encounter (Signed)
Tried to call pt, unable to LVM

## 2023-08-06 NOTE — Telephone Encounter (Signed)
Per Dr. Excell Seltzer, called to arrange follow-up to discuss results and potential treatment options.    The patient's mailbox is full and I was unable to leave message.   Will try again later.  The patient has historically been difficult to reach. Will send her a letter to contact the office.

## 2023-08-07 NOTE — Telephone Encounter (Signed)
I reviewed demographics section of her chart. Her emergency contact is her son EVANI SHRIDER. [phone] Pls contact him and recommend pt call office to schedule appt-thx

## 2023-08-07 NOTE — Telephone Encounter (Signed)
Tried calling pt again, still unable to reach pt.

## 2023-08-08 ENCOUNTER — Encounter: Payer: Self-pay | Admitting: Family Medicine

## 2023-08-08 ENCOUNTER — Other Ambulatory Visit: Payer: Self-pay | Admitting: Family Medicine

## 2023-08-08 ENCOUNTER — Ambulatory Visit (INDEPENDENT_AMBULATORY_CARE_PROVIDER_SITE_OTHER): Payer: Medicare PPO | Admitting: Family Medicine

## 2023-08-08 VITALS — BP 166/77 | HR 70 | Wt 216.4 lb

## 2023-08-08 DIAGNOSIS — R300 Dysuria: Secondary | ICD-10-CM | POA: Diagnosis not present

## 2023-08-08 DIAGNOSIS — R3129 Other microscopic hematuria: Secondary | ICD-10-CM

## 2023-08-08 DIAGNOSIS — N3946 Mixed incontinence: Secondary | ICD-10-CM

## 2023-08-08 LAB — POC URINALSYSI DIPSTICK (AUTOMATED)
Bilirubin, UA: NEGATIVE
Glucose, UA: POSITIVE — AB
Ketones, UA: NEGATIVE
Leukocytes, UA: NEGATIVE
Nitrite, UA: NEGATIVE
Protein, UA: NEGATIVE
Spec Grav, UA: 1.01 (ref 1.010–1.025)
Urobilinogen, UA: NEGATIVE U/dL — AB
pH, UA: 5.5 (ref 5.0–8.0)

## 2023-08-08 MED ORDER — OXYBUTYNIN CHLORIDE ER 5 MG PO TB24: 5.0000 mg | ORAL_TABLET | Freq: Every day | ORAL | 0 refills | Status: DC

## 2023-08-08 NOTE — Patient Instructions (Addendum)
Call your cardiologist's office to arrange appointment to discuss your watchman device and blood thinners:    [phone]    I sent in a prescription for a medication to help your bladder. Take 1 tab after supper every night.  After 3 nights in a row if not noticing any improvement then start taking 2 tabs every night.

## 2023-08-08 NOTE — Progress Notes (Signed)
OFFICE VISIT  08/08/2023  CC:  Chief Complaint  Patient presents with   Urinary Concern    Pt still cannot control when she urinates and when she doesn't.      Patient is a 77 y.o. female who presents for urinary concerns.  HPI: Urinary incont worse x 2d, esp stress. Inc frequency, inc urg.  No dysuria.  No fever or flank pain. No signif hyperglyc at home.  +LBP and buttocks pain since fell onto her bottom recently No saddle anesthesia.  Urine culture 07/22/2023 positive for Enterobacter cloacae, resistant to Augmentin and cefazolin and nitrofurantoin.   Since 07/23/2023 her cardiology provider has been trying to get in touch with her.  There is some concern about leaking of her atrial appendage occlusive device--> detected on cardiac CT 07/23/2023.   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 ? to get liver bx if she gets bariatric surgery? Mild portal hypertensive gastropathy on EGD 08/2019.   Fibromyalgia    GAD (generalized anxiety disorder)    GERD    History of hiatal hernia    History of iron deficiency anemia 12/2018   Inadequate absorption secondary to chronic/long term PPI therapy + portal hypertensive gastroduodenopathy. No GIB found on EGD, colonosc, and givens. Iron infusions X multiple.   History of thrombocytopenia 12/2011   Hyperlipidemia    Intolerant of statins   HYPERTENSION    IBS (irritable bowel syndrome)    -D.  Good response to bentyl and imodium as of 06/2018 GI f/u.   IDDM (insulin dependent diabetes mellitus)    with DPN (managed by Dr. Elvera Lennox but then in 2018 pt preferred to have me manage for her convenience)   Limited mobility    Requires a walker for arthritic pain, widespread musculoskeletal pain, and neuropathic pain.   Morbid obesity (HCC)    As of 11/2018, pt considering sleeve  gastectomy vs bipass as of eval by Dr. Alvino Blood considering as of 12/2019.   Nonalcoholic steatohepatitis    Viral Hep screens NEG.  CT 2015.  Transaminasemia.  U/S 07/2018 showed early changes of cirrhosis.   OSA (obstructive sleep apnea) 09/14/2015   sleep study 09/07/15: severe obstructive sleep apnea with an AHI of 72 and SaO2 low of 75%.>referred to sleep MD   Osteoarthritis    hips, shoulders, knees   PAF (paroxysmal atrial fibrillation) (HCC)    One documented episode (after getting EGD 2016).  Was on amiodarone x 3 mo.  Rate control with metoprolol + anticoag with xarelto. Watchman 12/2022, off anticoag   Recurrent epistaxis    Granuloma in L nare cauterized by ENT 04/2020. Another cautery 06/2020   Small fiber neuropathy    Due to DM.  Symmetric hands and feet tingling/numbness.   Ulcerative colitis (HCC)    Remicade infusion Q 8 weeks: in clinical and endoscopic remission as of 12/2018 GI f/u.  06/11/19 rpt colonoscopy->cecal and ascending colon colitis.    Past Surgical History:  Procedure Laterality Date   ABDOMINAL HYSTERECTOMY  1980   Paps no longer indicated.   BACK SURGERY     BACTERIAL OVERGROWTH TEST N/A 07/13/2015   Procedure: BACTERIAL OVERGROWTH TEST;  Surgeon: Malissa Hippo, MD;  [facility];  Service: Endoscopy;  Laterality: N/A;  730     BILATERAL SALPINGOOPHORECTOMY  02/10/2001   BIOPSY  06/11/2019  Procedure: BIOPSY;  Surgeon: Malissa Hippo, MD;  [facility];  Service: Endoscopy;;  colon   BIOPSY  09/27/2019   Procedure: BIOPSY;  Surgeon: Malissa Hippo, MD;  [facility];  Service: Endoscopy;;  gastric duodenal   BIOPSY  09/15/2020   Procedure: BIOPSY;  Surgeon: Dolores Frame, MD;  [facility];  Service: Gastroenterology;;   BREAST REDUCTION SURGERY  1994   bilat   BREAST SURGERY     CARDIOVASCULAR STRESS TEST  07/2010   Lexiscan myoview: normal   CARPAL TUNNEL RELEASE Right 1996    CARPAL TUNNEL RELEASE Left 03/21/2003   CARPAL TUNNEL RELEASE Right 05/04/2013   Procedure: CARPAL TUNNEL RELEASE;  Surgeon: Wyn Forster., MD;  Location: [facility];  Service: Orthopedics;  Laterality: Right;   CARPAL TUNNEL RELEASE Left 09/21/2013   Procedure: LEFT CARPAL TUNNEL RELEASE;  Surgeon: Wyn Forster., MD;  Location: [facility];  Service: Orthopedics;  Laterality: Left;   CHOLECYSTECTOMY     COLONOSCOPY WITH PROPOFOL N/A 08/04/2015   Colitis in remission.  No polyps.  Procedure: COLONOSCOPY WITH PROPOFOL;  Surgeon: Malissa Hippo, MD;  [facility];  Service: Endoscopy;  Laterality: N/A;  cecum time in  0820   time out  0827    total time 7 minutes   COLONOSCOPY WITH PROPOFOL N/A 06/11/2019   cecal and ascending colon colitis.  Procedure: COLONOSCOPY WITH PROPOFOL;  Surgeon: Malissa Hippo, MD;  [facility];  Service: Endoscopy;  Laterality: N/A;  730a   COLONOSCOPY WITH PROPOFOL N/A 09/15/2020   Procedure: COLONOSCOPY WITH PROPOFOL;  Surgeon: Dolores Frame, MD;  [facility];  Service: Gastroenterology;  Laterality: N/A;  1030   ESOPHAGEAL DILATION N/A 08/04/2015   Procedure: ESOPHAGEAL DILATION;  Surgeon: Malissa Hippo, MD;  [facility];  Service: Endoscopy;  Laterality: N/ALauralee Evener, no mucousal disruption   ESOPHAGOGASTRODUODENOSCOPY  09/27/2019   Performed for IDA.  Esoph dilation was done but no stricture present.  Mild portal hypertensive gastropathy, o/w normal.  Duodenal bx NEG.  h pylori neg.   ESOPHAGOGASTRODUODENOSCOPY (EGD) WITH ESOPHAGEAL DILATION  12/02/2005   ESOPHAGOGASTRODUODENOSCOPY (EGD) WITH PROPOFOL N/A 08/04/2015   Procedure: ESOPHAGOGASTRODUODENOSCOPY (EGD) WITH PROPOFOL;  Surgeon: Malissa Hippo, MD;  [facility];  Service: Endoscopy;  Laterality: N/A;  procedure 1   ESOPHAGOGASTRODUODENOSCOPY (EGD) WITH PROPOFOL N/A 09/27/2019   Procedure:  ESOPHAGOGASTRODUODENOSCOPY (EGD) WITH PROPOFOL;  Surgeon: Malissa Hippo, MD;  [facility];  Service: Endoscopy;  Laterality: N/A;  12:10   ESOPHAGOGASTRODUODENOSCOPY (EGD) WITH PROPOFOL N/A 10/31/2021   Procedure: ESOPHAGOGASTRODUODENOSCOPY (EGD) WITH PROPOFOL;  Surgeon: Malissa Hippo, MD;  [facility];  Service: Endoscopy;  Laterality: N/A;  930   FLEXIBLE SIGMOIDOSCOPY  01/17/2012   Procedure: FLEXIBLE SIGMOIDOSCOPY;  Surgeon: Malissa Hippo, MD;  [facility];  Service: Endoscopy;  Laterality: N/A;   GIVENS CAPSULE STUDY N/A 08/03/2019   Procedure: GIVENS CAPSULE STUDY (performed for IDA)->some food debris in stomach and small amount of blood.  Surgeon: Malissa Hippo, MD;  [facility];  Service: Endoscopy;  Laterality: N/A;  730AM   HEMILAMINOTOMY LUMBAR SPINE Bilateral 09/07/1999   L4-5   KNEE ARTHROSCOPY Right 01/1999; 10/2000   LEFT ATRIAL APPENDAGE OCCLUSION N/A 01/23/2023   Procedure: LEFT ATRIAL APPENDAGE OCCLUSION;  Surgeon: [name], MD;  Location: [facility] INVASIVE CV LAB;  Service: Cardiovascular;  Laterality: N/A;   LYSIS OF ADHESION  02/10/2001   MALONEY DILATION  09/27/2019   Procedure: Elease Hashimoto DILATION;  Surgeon: Malissa Hippo, MD;  [facility];  Service: Endoscopy;;   NASAL ENDOSCOPY WITH EPISTAXIS CONTROL Bilateral 01/25/2022   Procedure: NASAL ENDOSCOPY WITH EPISTAXIS CONTROL;  Surgeon: Osborn Coho, MD;  Location: [facility];  Service: ENT;  Laterality: Bilateral;   NASAL SEPTOPLASTY W/ TURBINOPLASTY Bilateral 01/25/2022   Procedure: NASAL SEPTOPLASTY WITH TURBINATE REDUCTION;  Surgeon: Osborn Coho, MD;  Location: [facility] OR;  Service: ENT;  Laterality: Bilateral;   RECTOCELE REPAIR  1990; 09/12/2006   TARSAL TUNNEL RELEASE  2002   TEE WITHOUT CARDIOVERSION N/A 01/23/2023   Procedure: TRANSESOPHAGEAL ECHOCARDIOGRAM;  Surgeon: [name], MD;  Location: [facility] INVASIVE CV LAB;  Service: Cardiovascular;   Laterality: N/A;   TRANSTHORACIC ECHOCARDIOGRAM  08/04/2015   EF 60-65%, normal wall motion, mild LVH, mild LA dilation, grd I DD.   TUMOR EXCISION Left 03/21/2003   dorsal 1st web space (hand)   URETEROLYSIS Right 02/10/2001    Outpatient Medications Prior to Visit  Medication Sig Dispense Refill   Accu-Chek Softclix Lancets lancets USE TO CHECK BLOOD GLUCOSE UP TO 6 TIMES DAILY AS DIRECTED 200 each 5   acetaminophen (TYLENOL) 325 MG tablet Take 650 mg by mouth every 6 (six) hours as needed for moderate pain or headache.     aspirin EC 81 MG tablet Take 1 tablet (81 mg total) by mouth daily. Swallow whole.     blood glucose meter kit and supplies KIT Use up to six times daily as directed. DX. E11.9 1 each 0   canagliflozin (INVOKANA) 100 MG TABS tablet Take 1 tablet (100 mg total) by mouth daily before breakfast. 90 tablet 1   citalopram (CELEXA) 20 MG tablet Take 1 tablet (20 mg total) by mouth daily. 90 tablet 1   dicyclomine (BENTYL) 10 MG capsule TAKE 1 CAPSULE (10 MG TOTAL) BY MOUTH EVERY 8 (EIGHT) HOURS AS NEEDED FOR SPASMS (ABDOMINAL PAIN). 270 capsule 1   diphenoxylate-atropine (LOMOTIL) 2.5-0.025 MG tablet Take 1 tablet by mouth 4 (four) times daily as needed for diarrhea or loose stools. 60 tablet 2   Dulaglutide (TRULICITY) 1.5 MG/0.5ML SOPN INJECT 1.5MG  ONCE WEEKLY 18 mL 3   esomeprazole (NEXIUM) 20 MG capsule TAKE 1 CAPSULE BY MOUTH DAILY BEFORE BREAKFAST 90 capsule 3   ferric carboxymaltose (INJECTAFER) 750 MG/15ML SOLN injection Inject 15 mLs (750 mg total) into the vein as directed for 1 dose. Repeat in 7 days 15 mL 1   furosemide (LASIX) 40 MG tablet Take 1 tablet (40 mg total) by mouth daily. 90 tablet 1   gabapentin (NEURONTIN) 600 MG tablet TAKE 1 AND 1/2 TABLETS BY MOUTH TWICE A DAY. MUST KEEP APPT FOR FURTHER REFILLS 90 tablet 0   glucose blood (ACCU-CHEK GUIDE) test strip USE TO CHECK BLOOD GLUCOSE UP TO 6 TIMES DAILY AS DIRECTED 600 strip 3   INFLIXIMAB IV Inject into  the vein every 8 (eight) weeks. Remicaid 5mg /kg Infusion every 8 weeks [facility])     Insulin Pen Needle (B-D UF III MINI PEN NEEDLES) 31G X 5 MM MISC USE TO INJECT INSULINS EQUAL TO 6 TIMES DAILY. 500 each 6   insulin regular human CONCENTRATED (HUMULIN R U-500 KWIKPEN) 500 UNIT/ML KwikPen Inject 100 units into the skin at breakfast and lunch. 48 mL 1   lactase (LACTAID) 3000 UNITS tablet Take 3,000 Units by mouth as  needed (when eating foods containing dairy).      meclizine (ANTIVERT) 25 MG tablet Take 1 tablet (25 mg total) by mouth 3 (three) times daily as needed for dizziness or nausea. 90 tablet 0   methocarbamol (ROBAXIN) 500 MG tablet TAKE 1 TABLET BY MOUTH EVERY 8 HOURS AS NEEDED FOR MUSCLE SPASMS. 20 tablet 0   metoprolol succinate (TOPROL-XL) 100 MG 24 hr tablet TAKE 1 TABLET BY MOUTH 2 TIMES DAILY. TAKE WITH OR IMMEDIATELY FOLLOWING A MEAL 180 tablet 1   potassium chloride (KLOR-CON M10) 10 MEQ tablet Take 1 tablet (10 mEq total) by mouth daily. 90 tablet 1   sulfamethoxazole-trimethoprim (BACTRIM DS) 800-160 MG tablet Take 1 tablet by mouth 2 (two) times daily. 10 tablet 0   No facility-administered medications prior to visit.    Allergies  Allergen Reactions   Omeprazole Anaphylaxis and Swelling    SWELLING OF TONGUE AND THROAT   Actos [Pioglitazone] Swelling and Other (See Comments)    Weight gain, tongue swelling    Benzocaine-Menthol Swelling    SWELLING OF MOUTH   Colesevelam Other (See Comments)    GI UPSET   Flagyl [Metronidazole Hcl] Other (See Comments)    DIAPHORESIS   Metformin And Related Diarrhea   Shrimp [Shellfish Allergy] Itching    OF THROAT AND EARS, if consumed raw   Statins Palpitations   Allevyn Adhesive [Wound Dressings]     Other reaction(s): Unknown   Desipramine Hcl Itching, Nausea Only and Other (See Comments)    "swimmy" headed, ears itched    Hydromorphone Itching   Jardiance [Empagliflozin] Other (See Comments)     weakness   Lactose Intolerance (Gi) Diarrhea    Gas, bloating   Adhesive [Tape] Other (See Comments)    SKIN IRRITATION AND BRUISING   Nisoldipine Itching   Percocet [Oxycodone-Acetaminophen] Itching    Review of Systems  As per HPI  PE:    08/08/2023    4:08 PM 07/23/2023   12:31 PM 07/23/2023   12:28 PM  Vitals with BMI  Weight 216 lbs 6 oz    BMI 39.57    Systolic 166 126 829  Diastolic 77 69 49  Pulse 70       Physical Exam  Gen: Alert, well appearing.  Patient is oriented to person, place, time, and situation. No further exam today  LABS:  Last CBC Lab Results  Component Value Date   WBC 3.7 (L) 05/29/2023   HGB 8.8 Repeated and verified X2. (L) 05/29/2023   HCT 29.5 (L) 05/29/2023   MCV 79.8 05/29/2023   MCH 24.7 (L) 05/08/2023   RDW 18.0 (H) 05/29/2023   PLT 134.0 (L) 05/29/2023   Lab Results  Component Value Date   IRON 57 05/29/2023   TIBC 358 05/29/2023   FERRITIN 7 (L) 05/29/2023   Last metabolic panel Lab Results  Component Value Date   GLUCOSE 203 (H) 05/29/2023   NA 139 05/29/2023   K 3.5 05/29/2023   CL 101 05/29/2023   CO2 27 05/29/2023   BUN 8 05/29/2023   CREATININE 0.80 07/23/2023   GFR 86.23 05/29/2023   CALCIUM 8.5 05/29/2023   PROT 6.9 05/08/2023   ALBUMIN 3.3 (A) 02/25/2023   LABGLOB 3.4 08/17/2015   AGRATIO 1.0 08/17/2015   BILITOT 0.7 05/08/2023   ALKPHOS 108 02/25/2023   AST 29 05/08/2023   ALT 15 05/08/2023   ANIONGAP 5 10/10/2022   Last hemoglobin A1c Lab Results  Component Value Date  HGBA1C 8.5 (A) 05/29/2023   HGBA1C 8.5 05/29/2023   HGBA1C 8.5 (A) 05/29/2023   HGBA1C 8.5 (A) 05/29/2023   IMPRESSION AND PLAN:  Urinary incont, multifactorial-->urge, stress, glucosuria. UA today:  +glucose, +blood, o/w normal. Sent specimen for microscopy and c/s. Start ditropan xl 5mg  every day. Considering urol ref.  An After Visit Summary was printed and given to the patient.  FOLLOW UP: 7-10 d  Signed:  Santiago Bumpers, MD           08/08/2023

## 2023-08-08 NOTE — Telephone Encounter (Signed)
Pt scheduled for today, 11/8 at 4:00p.

## 2023-08-09 LAB — URINALYSIS, ROUTINE W REFLEX MICROSCOPIC
Bacteria, UA: NONE SEEN /[HPF]
Bilirubin Urine: NEGATIVE
Hyaline Cast: NONE SEEN /[LPF]
Ketones, ur: NEGATIVE
Leukocytes,Ua: NEGATIVE
Nitrite: NEGATIVE
Protein, ur: NEGATIVE
RBC / HPF: NONE SEEN /[HPF] (ref 0–2)
Specific Gravity, Urine: 1.022 (ref 1.001–1.035)
pH: <=5 (ref 5.0–8.0)

## 2023-08-09 LAB — URINE CULTURE
MICRO NUMBER:: 15706105
Result:: NO GROWTH
SPECIMEN QUALITY:: ADEQUATE

## 2023-08-09 LAB — MICROSCOPIC MESSAGE

## 2023-08-11 ENCOUNTER — Telehealth: Payer: Self-pay

## 2023-08-11 NOTE — Telephone Encounter (Signed)
Please confirm if 11/18 appt still needed? Patient was last seen 11/4

## 2023-08-11 NOTE — Telephone Encounter (Signed)
Pt advised ok to reschedule, appt changed to 12/11.

## 2023-08-11 NOTE — Telephone Encounter (Signed)
The patient called back after receiving her letter. Reiterated to her that she still has a leak around her Watchman device. As the patient lives in [place], she would rather do a phone visit with Dr. Excell Seltzer instead of coming to [place]. Scheduled her for telephone visit 08/15/2023. She understands to have her phone on her ar 3:20PM.  She stated that sometimes her phone does not audibly ring and she will have her phone in her hand at that time.     Patient Consent for Virtual Visit        [name] has provided verbal consent on 08/11/2023 for a virtual visit (video or telephone).   CONSENT FOR VIRTUAL VISIT FOR:  [name]  By participating in this virtual visit I agree to the following:  I hereby voluntarily request, consent and authorize [facility] and its employed or contracted physicians, physician assistants, nurse practitioners or other licensed health care professionals (the Practitioner), to provide me with telemedicine health care services (the "Services") as deemed necessary by the treating Practitioner. I acknowledge and consent to receive the Services by the Practitioner via telemedicine. I understand that the telemedicine visit will involve communicating with the Practitioner through live audiovisual communication technology and the disclosure of certain medical information by electronic transmission. I acknowledge that I have been given the opportunity to request an in-person assessment or other available alternative prior to the telemedicine visit and am voluntarily participating in the telemedicine visit.  I understand that I have the right to withhold or withdraw my consent to the use of telemedicine in the course of my care at any time, without affecting my right to future care or treatment, and that the Practitioner or I may terminate the telemedicine visit at any time. I understand that I have the right to inspect all information obtained and/or recorded  in the course of the telemedicine visit and may receive copies of available information for a reasonable fee.  I understand that some of the potential risks of receiving the Services via telemedicine include:  Delay or interruption in medical evaluation due to technological equipment failure or disruption; Information transmitted may not be sufficient (e.g. poor resolution of images) to allow for appropriate medical decision making by the Practitioner; and/or  In rare instances, security protocols could fail, causing a breach of personal health information.  Furthermore, I acknowledge that it is my responsibility to provide information about my medical history, conditions and care that is complete and accurate to the best of my ability. I acknowledge that Practitioner's advice, recommendations, and/or decision may be based on factors not within their control, such as incomplete or inaccurate data provided by me or distortions of diagnostic images or specimens that may result from electronic transmissions. I understand that the practice of medicine is not an exact science and that Practitioner makes no warranties or guarantees regarding treatment outcomes. I acknowledge that a copy of this consent can be made available to me via my patient portal [facility] MyChart), or I can request a printed copy by calling the office of [facility].    I understand that my insurance will be billed for this visit.   I have read or had this consent read to me. I understand the contents of this consent, which adequately explains the benefits and risks of the Services being provided via telemedicine.  I have been provided ample opportunity to ask questions regarding this consent and the Services and have had my questions  answered to my satisfaction. I give my informed consent for the services to be provided through the use of telemedicine in my medical care

## 2023-08-11 NOTE — Telephone Encounter (Signed)
Patient wanted to let Dr. Milinda Cave know that she is doing very well medication oxybutynin (DITROPAN XL) 5 MG 24 hr tablet .  Does patient need to come back on 11/18?  Please call patient [phone].

## 2023-08-11 NOTE — Telephone Encounter (Signed)
Okay to reschedule for around the second week December.

## 2023-08-15 ENCOUNTER — Ambulatory Visit: Payer: Medicare PPO | Attending: Nurse Practitioner | Admitting: Cardiovascular Disease

## 2023-08-15 ENCOUNTER — Encounter: Payer: Self-pay | Admitting: Cardiovascular Disease

## 2023-08-15 ENCOUNTER — Telehealth: Payer: Self-pay

## 2023-08-15 VITALS — Ht 62.0 in

## 2023-08-15 DIAGNOSIS — Z01810 Encounter for preprocedural cardiovascular examination: Secondary | ICD-10-CM

## 2023-08-15 DIAGNOSIS — I480 Paroxysmal atrial fibrillation: Secondary | ICD-10-CM | POA: Diagnosis not present

## 2023-08-15 DIAGNOSIS — Z95818 Presence of other cardiac implants and grafts: Secondary | ICD-10-CM | POA: Diagnosis not present

## 2023-08-15 NOTE — Assessment & Plan Note (Addendum)
Well-controlled. On ASA now over 6 months out from [facility] implantation. Final recommendations pending TEE assessment.

## 2023-08-15 NOTE — Patient Instructions (Signed)
Medication Instructions:  Your physician recommends that you continue on your current medications as directed. Please refer to the Current Medication list given to you today. *If you need a refill on your cardiac medications before your next appointment, please call your pharmacy*  Lab Work: CBC, BMET in next 2-3 days If you have labs (blood work) drawn today and your tests are completely normal, you will receive your results only by: MyChart Message (if you have MyChart) OR A paper copy in the mail If you have any lab test that is abnormal or we need to change your treatment, we will call you to review the results.  Testing/Procedures: Transesophageal Echocardiogram Your physician has requested that you have a TEE. During a TEE, sound waves are used to create images of your heart. It provides your doctor with information about the size and shape of your heart and how well your heart's chambers and valves are working. In this test, a transducer is attached to the end of a flexible tube that's guided down your throat and into your esophagus (the tube leading from you mouth to your stomach) to get a more detailed image of your heart. You are not awake for the procedure. Please see the instruction sheet given to you today. For further information please visit [email or web address]  Follow-Up: At [facility], you and your health needs are our priority.  As part of our continuing mission to provide you with exceptional heart care, we have created designated Provider Care Teams.  These Care Teams include your primary Cardiologist (physician) and Advanced Practice Providers (APPs -  Physician Assistants and Nurse Practitioners) who all work together to provide you with the care you need, when you need it.  We recommend signing up for the patient portal called "MyChart".  Sign up information is provided on this After Visit Summary.  MyChart is used to connect with patients for Virtual Visits  (Telemedicine).  Patients are able to view lab/test results, encounter notes, upcoming appointments, etc.  Non-urgent messages can be sent to your provider as well.   To learn more about what you can do with MyChart, go to [email or web address].    Your next appointment:   Structural Team will follow-up  Provider:   [name], MD      Other Instructions    Dear [name]  You are scheduled for a TEE (Transesophageal Echocardiogram) on Friday, November 22 with Dr. Flora Lipps.  Please arrive at the [facility] (Main Entrance A) at [facility]: [address] [place], [place] [address] at 12:00 PM (This time is one hour(s) before your procedure to ensure your preparation).   Free valet parking service is available. You will check in at ADMITTING.   *Please Note: You will receive a call the day before your procedure to confirm the appointment time. That time may have changed from the original time based on the schedule for that day.*   DIET:  Nothing to eat or drink after midnight except a sip of water with medications (see medication instructions below)  MEDICATION INSTRUCTIONS: !!IF ANY NEW MEDICATIONS ARE STARTED AFTER TODAY, PLEASE NOTIFY YOUR PROVIDER AS SOON AS POSSIBLE!!  FYI: Medications such as Semaglutide (Ozempic, [place]), Tirzepatide (Mounjaro, Zepbound), Dulaglutide (Trulicity), etc ("GLP1 agonists") AND Canagliflozin (Invokana), Dapagliflozin (Farxiga), Empagliflozin (Jardiance), Ertugliflozin (Steglatro), Bexagliflozin [employer]) or any combination with one of these drugs such as Invokamet (Canagliflozin/Metformin), Synjardy (Empagliflozin/Metformin), etc ("SGLT2 inhibitors") must be held around the time of a procedure. This is not  a comprehensive list of all of these drugs. Please review all of your medications and talk to your provider if you take any one of these. If you are not sure, ask your provider.   HOLD: Dulaglutide (Trulicity) for 7 days prior to the  procedure.  HOLD: Canagliflozin (Invokana) for 3 days prior to the procedure. Last dose on Monday, November 18.   HOLD Furosemide/lasix the morning of procedure  HOLD Humulin R insulin for morning and lunch time doses on day of procedure   LABS: to be done by Wednesday, 08/20/23  FYI:  For your safety, and to allow [place] to monitor your vital signs accurately during the surgery/procedure we request: If you have artificial nails, gel coating, SNS etc, please have those removed prior to your surgery/procedure. Not having the nail coverings /polish removed may result in cancellation or delay of your surgery/procedure.  Your support person will be asked to wait in the waiting room during your procedure.  It is OK to have someone drop you off and come back when you are ready to be discharged.  You cannot drive after the procedure and will need someone to drive you home.  Bring your insurance cards.  *Special Note: Every effort is made to have your procedure done on time. Occasionally there are emergencies that occur at the hospital that may cause delays. Please be patient if a delay does occur.

## 2023-08-15 NOTE — H&P (View-Only) (Signed)
Cardiology Office Note:    Date:  08/15/2023   ID:  [name] 03-17-1946, MRN [id number]  PCP:  Jennifer Massed, MD   [facility] Providers Cardiologist:  Jennifer Millers, MD     Referring MD: Jennifer Massed, MD   Chief Complaint  Patient presents with   Atrial Fibrillation    History of Present Illness:    [name] is a 77 y.o. female with a hx of paroxysmal atrial fibrillation status post left atrial appendage occlusion January 22, 2023.  She was treated with a 24 mm Watchman Flex Pro device.  Her procedure was uncomplicated and she transition from rivaroxaban to clopidogrel at 6 weeks and then from clopidogrel to aspirin 81 mg at 6 months.  After the procedure a CT scan for surveillance was performed and showed a 3 to 4 mm device leak.  A repeat scan at 6 months was completed and demonstrated a 5.8 mm gap on the mitral side of the device with filling of the secondary posterior lobe of the appendage.  Device compression is 13%.  There is no device related thrombus present.  The patient is clinically doing well.  This is a phone conversation today as she was unable to come into the office.  She is doing fine and has not had any recent symptoms of heart palpitation or anything suggestive of recurrent atrial fibrillation.  She denies any further epistaxis since she has been off of anticoagulation.  She has no chest pain, chest pressure, or shortness of breath.   Current Medications: Current Meds  Medication Sig   Accu-Chek Softclix Lancets lancets USE TO CHECK BLOOD GLUCOSE UP TO 6 TIMES DAILY AS DIRECTED   acetaminophen (TYLENOL) 325 MG tablet Take 650 mg by mouth every 6 (six) hours as needed for moderate pain or headache.   aspirin EC 81 MG tablet Take 1 tablet (81 mg total) by mouth daily. Swallow whole.   blood glucose meter kit and supplies KIT Use up to six times daily as directed. DX. E11.9   canagliflozin (INVOKANA) 100 MG TABS tablet Take 1 tablet  (100 mg total) by mouth daily before breakfast.   citalopram (CELEXA) 20 MG tablet Take 1 tablet (20 mg total) by mouth daily.   dicyclomine (BENTYL) 10 MG capsule TAKE 1 CAPSULE (10 MG TOTAL) BY MOUTH EVERY 8 (EIGHT) HOURS AS NEEDED FOR SPASMS (ABDOMINAL PAIN).   diphenoxylate-atropine (LOMOTIL) 2.5-0.025 MG tablet Take 1 tablet by mouth 4 (four) times daily as needed for diarrhea or loose stools.   Dulaglutide (TRULICITY) 1.5 MG/0.5ML SOPN INJECT 1.5MG  ONCE WEEKLY   esomeprazole (NEXIUM) 20 MG capsule TAKE 1 CAPSULE BY MOUTH DAILY BEFORE BREAKFAST   furosemide (LASIX) 40 MG tablet Take 1 tablet (40 mg total) by mouth daily.   gabapentin (NEURONTIN) 600 MG tablet TAKE 1 AND 1/2 TABLETS BY MOUTH TWICE A DAY. MUST KEEP APPT FOR FURTHER REFILLS   glucose blood (ACCU-CHEK GUIDE) test strip USE TO CHECK BLOOD GLUCOSE UP TO 6 TIMES DAILY AS DIRECTED   INFLIXIMAB IV Inject into the vein every 8 (eight) weeks. Remicaid 5mg /kg Infusion every 8 weeks [facility])   Insulin Pen Needle (B-D UF III MINI PEN NEEDLES) 31G X 5 MM MISC USE TO INJECT INSULINS EQUAL TO 6 TIMES DAILY.   insulin regular human CONCENTRATED (HUMULIN R U-500 KWIKPEN) 500 UNIT/ML KwikPen Inject 100 units into the skin at breakfast and lunch.   lactase (LACTAID) 3000 UNITS tablet Take  3,000 Units by mouth as needed (when eating foods containing dairy).    meclizine (ANTIVERT) 25 MG tablet Take 1 tablet (25 mg total) by mouth 3 (three) times daily as needed for dizziness or nausea.   metoprolol succinate (TOPROL-XL) 100 MG 24 hr tablet TAKE 1 TABLET BY MOUTH 2 TIMES DAILY. TAKE WITH OR IMMEDIATELY FOLLOWING A MEAL   oxybutynin (DITROPAN XL) 5 MG 24 hr tablet Take 1 tablet (5 mg total) by mouth at bedtime.   potassium chloride (KLOR-CON M10) 10 MEQ tablet Take 1 tablet (10 mEq total) by mouth daily.     Allergies:   Omeprazole, Actos [pioglitazone], Benzocaine-menthol, Colesevelam, Flagyl [metronidazole hcl], Metformin and  related, Shrimp [shellfish allergy], Statins, Allevyn adhesive [wound dressings], Desipramine hcl, Hydromorphone, Jardiance [empagliflozin], Lactose intolerance (gi), Adhesive [tape], Nisoldipine, and Percocet [oxycodone-acetaminophen]   ROS:   Please see the history of present illness.    All other systems reviewed and are negative.  EKGs/Labs/Other Studies Reviewed:    The following studies were reviewed today: Cardiac Studies & Procedures       ECHOCARDIOGRAM  ECHOCARDIOGRAM COMPLETE 08/04/2015  Narrative *[facility]* [address] [place], [place] [address] [phone]  ------------------------------------------------------------------- Transthoracic Echocardiography  Patient:    [name] MR #:       [id number] Study Date: 08/04/2015 Gender:     F Age:        49 Height:     157.5 cm Weight:     95.7 kg BSA:        2.1 m^2 Pt. Status: Room:       IC08  ADMITTING    [name] REFERRING    [name] ATTENDING    [name] [id number] SONOGRAPHER  [name] PERFORMING   [facility]  cc:  ------------------------------------------------------------------- LV EF: 60% -   65%  ------------------------------------------------------------------- Indications:      Atrial fibrillation - 427.31.  ------------------------------------------------------------------- History:   PMH:  Palpitations.  Atrial fibrillation.  Risk factors: Hypertension. Diabetes mellitus. Dyslipidemia.  ------------------------------------------------------------------- Study Conclusions  - Left ventricle: The cavity size was normal. Wall thickness was increased in a pattern of moderate LVH. Systolic function was normal. The estimated ejection fraction was in the range of 60% to 65%. Wall motion was normal; there were no regional wall motion abnormalities. Doppler parameters are consistent with abnormal left  ventricular relaxation (grade 1 diastolic dysfunction). - Aortic valve: Moderately calcified annulus. Trileaflet; mildly thickened leaflets. Valve area (VTI): 2.21 cm^2. Valve area (Vmax): 2.1 cm^2. - Mitral valve: Mildly calcified annulus. Mildly thickened leaflets . - Left atrium: The atrium was mildly dilated. - Technically adequate study.  Transthoracic echocardiography.  M-mode, complete 2D, spectral Doppler, and color Doppler.  Birthdate:  Patient birthdate: [birth year].  Age:  Patient is 77 yr old.  Sex:  Gender: female. BMI: 38.6 kg/m^2.  Blood pressure:     128/64  Patient status: Inpatient.  Study date:  Study date: 08/04/2015. Study time: 02:56 PM.  Location:  Bedside.  -------------------------------------------------------------------  ------------------------------------------------------------------- Left ventricle:  The cavity size was normal. Wall thickness was increased in a pattern of moderate LVH. Systolic function was normal. The estimated ejection fraction was in the range of 60% to 65%. Wall motion was normal; there were no regional wall motion abnormalities. Doppler parameters are consistent with abnormal left ventricular relaxation (grade 1 diastolic dysfunction).  ------------------------------------------------------------------- Aortic valve:   Moderately calcified annulus. Trileaflet; mildly  thickened leaflets.  Doppler:   There was no stenosis.   There was no significant regurgitation.    VTI ratio of LVOT to aortic valve: 0.58. Valve area (VTI): 2.21 cm^2. Indexed valve area (VTI): 1.05 cm^2/m^2. Peak velocity ratio of LVOT to aortic valve: 0.55. Valve area (Vmax): 2.1 cm^2. Indexed valve area (Vmax): 1 cm^2/m^2. Mean gradient (S): 6 mm Hg. Peak gradient (S): 11 mm Hg.  ------------------------------------------------------------------- Aorta:  Aortic root: The aortic root was normal in  size.  ------------------------------------------------------------------- Mitral valve:   Mildly calcified annulus. Mildly thickened leaflets .  Doppler:   There was no evidence for stenosis.   There was no significant regurgitation.    Peak gradient (D): 3 mm Hg.  ------------------------------------------------------------------- Left atrium:  The atrium was mildly dilated.  ------------------------------------------------------------------- Atrial septum:  Poorly visualized.  ------------------------------------------------------------------- Right ventricle:  The cavity size was normal. Systolic function was normal.  ------------------------------------------------------------------- Pulmonic valve:   Not well visualized.  Doppler:   There was no evidence for stenosis.   There was no significant regurgitation.  ------------------------------------------------------------------- Tricuspid valve:   Normal thickness leaflets.  Doppler:   There was no evidence for stenosis.   There was no significant regurgitation.  ------------------------------------------------------------------- Pulmonary artery:    Systolic pressure could not be accurately estimated.   Inadequate TR jet.  ------------------------------------------------------------------- Right atrium:  The atrium was normal in size.  ------------------------------------------------------------------- Pericardium:  There was no pericardial effusion.  ------------------------------------------------------------------- Systemic veins: Inferior vena cava: The vessel was normal in size. The respirophasic diameter changes were in the normal range (>= 50%), consistent with normal central venous pressure.  ------------------------------------------------------------------- Measurements  Left ventricle                           Value           Reference LV ID, ED, PLAX chordal          (L)     41.9   mm       43 - 52 LV  ID, ES, PLAX chordal                  28.7   mm       23 - 38 LV fx shortening, PLAX chordal           32     %        >=29 LV PW thickness, ED                      14.6   mm       --------- IVS/LV PW ratio, ED                      0.94            <=1.3  Ventricular septum                       Value           Reference IVS thickness, ED                        13.7   mm       ---------  LVOT  Value           Reference LVOT ID, S                               22     mm       --------- LVOT area                                3.8    cm^2     --------- LVOT peak velocity, S                    91.32  cm/s     --------- LVOT VTI, S                              23.79  cm       ---------  Aortic valve                             Value           Reference Aortic valve peak velocity, S            165.6  cm/s     --------- Aortic valve mean velocity, S            114.06 cm/s     --------- Aortic valve VTI, S                      40.96  cm       --------- Aortic mean gradient, S                  6      mm Hg    --------- Aortic peak gradient, S                  11     mm Hg    --------- VTI ratio, LVOT/AV                       0.58            --------- Aortic valve area, VTI                   2.21   cm^2     --------- Aortic valve area/bsa, VTI               1.05   cm^2/m^2 --------- Velocity ratio, peak, LVOT/AV            0.55            --------- Aortic valve area, peak velocity         2.1    cm^2     --------- Aortic valve area/bsa, peak              1      cm^2/m^2 --------- velocity  Aorta                                    Value           Reference Aortic root ID, ED  33     mm       ---------  Left atrium                              Value           Reference LA ID, A-P, ES                           38     mm       --------- LA volume/bsa, ES, 1-p A4C               29.9   ml/m^2   ---------  Mitral valve                              Value           Reference Mitral E-wave peak velocity              88.8   cm/s     --------- Mitral A-wave peak velocity              116    cm/s     --------- Mitral deceleration time                 229    ms       150 - 230 Mitral peak gradient, D                  3      mm Hg    --------- Mitral E/A ratio, peak                   0.8             ---------  Right ventricle                          Value           Reference TAPSE                                    23     mm       ---------  Legend: (L)  and  (H)  mark values outside specified reference range.  ------------------------------------------------------------------- Prepared and Electronically Authenticated by  Patrick Jupiter, M.D. 2016-11-04T15:30:18   TEE  ECHO TEE 01/23/2023  Narrative TRANSESOPHOGEAL ECHO REPORT    Patient Name:   DEZYRAE LUEBBERT Date of Exam: 01/23/2023 Medical Rec #:  [id number]     Height:       62.0 in Accession #:    [id number]    Weight:       226.0 lb Date of Birth:  [birth year]    BSA:          2.014 m Patient Age:    76 years      BP:           175/97 mmHg Patient Gender: F             HR:           68 bpm. Exam Location:  Inpatient  Procedure: Transesophageal Echo, 3D Echo, Color Doppler and Cardiac Doppler  Indications:     I48.1 Persistent atrial fibrillation  History:         Patient has prior history of Echocardiogram examinations, most recent 08/04/2015. Arrythmias:Atrial Fibrillation; Risk Factors:Hypertension, Sleep Apnea, Diabetes and Dyslipidemia.  Sonographer:     Irving Burton Senior RDCS Referring Phys:  [phone] Adaria Hole Diagnosing Phys: Thurmon Fair MD   Sonographer Comments: 24mm Watchman Device Implanted   PROCEDURE: After discussion of the risks and benefits of a TEE, an informed consent was obtained from the patient. The transesophogeal probe was passed without difficulty through the esophogus of the patient. Sedation performed by different physician. The  patient was monitored while under deep sedation. The patient's vital signs; including heart rate, blood pressure, and oxygen saturation; remained stable throughout the procedure. The patient developed no complications during the procedure.  TEE was used for baseline evaluation and device selection, to guide transseptal puncture and device deployment and to assess the final result and potential complications.  IMPRESSIONS   1. After the procedure, there is a well-seated 24 mm Watchman FLX atrial appendage occluder device with adequate compression and no evidence of peri-device leak. 2. At the end of the procedure there is a very small iatrogenic atrial septal defect with left-to-right shunting. 3. Left ventricular ejection fraction, by estimation, is 60 to 65%. The left ventricle has normal function. The left ventricle has no regional wall motion abnormalities. 4. Right ventricular systolic function is normal. The right ventricular size is normal. 5. No left atrial/left atrial appendage thrombus was detected. 6. The mitral valve is normal in structure. Trivial mitral valve regurgitation. No evidence of mitral stenosis. 7. The aortic valve is tricuspid. Aortic valve regurgitation is not visualized. No aortic stenosis is present. 8. The inferior vena cava is normal in size with greater than 50% respiratory variability, suggesting right atrial pressure of 3 mmHg.  FINDINGS Left Ventricle: Left ventricular ejection fraction, by estimation, is 60 to 65%. The left ventricle has normal function. The left ventricle has no regional wall motion abnormalities. The left ventricular internal cavity size was normal in size. There is no left ventricular hypertrophy.  Right Ventricle: The right ventricular size is normal. No increase in right ventricular wall thickness. Right ventricular systolic function is normal.  Left Atrium: Left atrial size was normal in size. No left atrial/left atrial appendage thrombus  was detected.  Right Atrium: Right atrial size was normal in size.  Pericardium: There is no evidence of pericardial effusion.  Mitral Valve: The mitral valve is normal in structure. Trivial mitral valve regurgitation. No evidence of mitral valve stenosis.  Tricuspid Valve: The tricuspid valve is normal in structure. Tricuspid valve regurgitation is not demonstrated. No evidence of tricuspid stenosis.  Aortic Valve: The aortic valve is tricuspid. Aortic valve regurgitation is not visualized. No aortic stenosis is present.  Pulmonic Valve: The pulmonic valve was normal in structure. Pulmonic valve regurgitation is trivial. No evidence of pulmonic stenosis.  Aorta: The aortic root is normal in size and structure.  Venous: The inferior vena cava is normal in size with greater than 50% respiratory variability, suggesting right atrial pressure of 3 mmHg.  IAS/Shunts: No atrial level shunt detected by color flow Doppler.  Additional Comments: Spectral Doppler performed.  Thurmon Fair MD Electronically signed by Thurmon Fair MD Signature Date/Time: 01/23/2023/1:45:57 PM    Final            EKG:        Recent Labs: 10/09/2022: Magnesium 2.2; TSH 0.618 05/08/2023: ALT 15 05/29/2023: BUN 8; Hemoglobin 8.8 Repeated and verified X2.; Platelets  134.0; Potassium 3.5; Sodium 139 07/23/2023: Creatinine, Ser 0.80  Recent Lipid Panel    Component Value Date/Time   CHOL 149 05/06/2023 1450   TRIG 123 05/06/2023 1450   TRIG 196 01/26/2010 0000   HDL 38 (L) 05/06/2023 1450   CHOLHDL 3.9 05/06/2023 1450   CHOLHDL 5 08/28/2022 1019   VLDL 27.8 08/28/2022 1019   LDLCALC 89 05/06/2023 1450   LDLDIRECT 69.5 01/24/2011 1125     Risk Assessment/Calculations:    CHA2DS2-VASc Score = 5   This indicates a 7.2% annual risk of stroke. The patient's score is based upon: CHF History: 0 HTN History: 1 Diabetes History: 1 Stroke History: 0 Vascular Disease History: 0 Age Score: 2 Gender  Score: 1            Physical Exam:    VS:  Ht 5\' 2"  (1.575 m)   BMI 39.58 kg/m     Wt Readings from Last 3 Encounters:  08/08/23 216 lb 6.4 oz (98.2 kg)  07/23/23 225 lb (102.1 kg)  07/22/23 225 lb 3.2 oz (102.2 kg)    Exam is deferred due to telephone type encounter Assessment & Plan Presence of Watchman left atrial appendage closure device CT study reviewed.  Images personally reviewed with our imaging specialist.  Patient with an approximate 5 mm device leak by CT angiography.  The patient is clinically doing well on aspirin 81 mg daily.  We discussed the clinical equipoise in this situation.  We discussed potential treatment strategies which include resumption of oral anticoagulation, continuation of low-dose antiplatelet therapy, further diagnostic testing with a transesophageal echo, and the possibility of device closure of the residual gap at the base of the device.  After discussion of treatment options and shared decision making conversation, I have recommended that the patient proceed with transesophageal echo for further assessment of her Watchman FLX peri-device leak. Since all of the data regarding cessation of anticoagulation comes from TEE measurement of device leak, I favor proceeding with TEE for further assessment.  Pre-procedural cardiovascular examination Will check labs for TEE.  PAF (paroxysmal atrial fibrillation) (HCC) Well-controlled. On ASA now over 6 months out from [facility] implantation. Final recommendations pending TEE assessment.        Informed Consent   Shared Decision Making/Informed Consent   The risks [esophageal damage, perforation (1:10,000 risk), bleeding, pharyngeal hematoma as well as other potential complications associated with conscious sedation including aspiration, arrhythmia, respiratory failure and death], benefits (treatment guidance and diagnostic support) and alternatives of a transesophageal echocardiogram were discussed in detail  with Ms. Brittin and she is willing to proceed.        Medication Adjustments/Labs and Tests Ordered: Current medicines are reviewed at length with the patient today.  Concerns regarding medicines are outlined above.  Orders Placed This Encounter  Procedures   CBC   Basic metabolic panel   No orders of the defined types were placed in this encounter.   Patient Instructions  Medication Instructions:  Your physician recommends that you continue on your current medications as directed. Please refer to the Current Medication list given to you today. *If you need a refill on your cardiac medications before your next appointment, please call your pharmacy*  Lab Work: CBC, BMET in next 2-3 days If you have labs (blood work) drawn today and your tests are completely normal, you will receive your results only by: MyChart Message (if you have MyChart) OR A paper copy in the mail If you have any lab  test that is abnormal or we need to change your treatment, we will call you to review the results.  Testing/Procedures: Transesophageal Echocardiogram Your physician has requested that you have a TEE. During a TEE, sound waves are used to create images of your heart. It provides your doctor with information about the size and shape of your heart and how well your heart's chambers and valves are working. In this test, a transducer is attached to the end of a flexible tube that's guided down your throat and into your esophagus (the tube leading from you mouth to your stomach) to get a more detailed image of your heart. You are not awake for the procedure. Please see the instruction sheet given to you today. For further information please visit [email or web address]  Follow-Up: At [facility], you and your health needs are our priority.  As part of our continuing mission to provide you with exceptional heart care, we have created designated Provider Care Teams.  These Care Teams include your  primary Cardiologist (physician) and Advanced Practice Providers (APPs -  Physician Assistants and Nurse Practitioners) who all work together to provide you with the care you need, when you need it.  We recommend signing up for the patient portal called "MyChart".  Sign up information is provided on this After Visit Summary.  MyChart is used to connect with patients for Virtual Visits (Telemedicine).  Patients are able to view lab/test results, encounter notes, upcoming appointments, etc.  Non-urgent messages can be sent to your provider as well.   To learn more about what you can do with MyChart, go to [email or web address].    Your next appointment:   Structural Team will follow-up  Provider:   [name], MD      Other Instructions    Dear [name]  You are scheduled for a TEE (Transesophageal Echocardiogram) on Friday, November 22 with Dr. Flora Lipps.  Please arrive at the [facility] (Main Entrance A) at [facility]: [address] [place], [place] [address] at 12:00 PM (This time is one hour(s) before your procedure to ensure your preparation).   Free valet parking service is available. You will check in at ADMITTING.   *Please Note: You will receive a call the day before your procedure to confirm the appointment time. That time may have changed from the original time based on the schedule for that day.*   DIET:  Nothing to eat or drink after midnight except a sip of water with medications (see medication instructions below)  MEDICATION INSTRUCTIONS: !!IF ANY NEW MEDICATIONS ARE STARTED AFTER TODAY, PLEASE NOTIFY YOUR PROVIDER AS SOON AS POSSIBLE!!  FYI: Medications such as Semaglutide (Ozempic, [place]), Tirzepatide (Mounjaro, Zepbound), Dulaglutide (Trulicity), etc ("GLP1 agonists") AND Canagliflozin (Invokana), Dapagliflozin (Farxiga), Empagliflozin (Jardiance), Ertugliflozin (Ste[place]atro), Bexagliflozin [employer]) or any combination with one of these drugs such as  Invokamet (Canagliflozin/Metformin), Synjardy (Empagliflozin/Metformin), etc ("SGLT2 inhibitors") must be held around the time of a procedure. This is not a comprehensive list of all of these drugs. Please review all of your medications and talk to your provider if you take any one of these. If you are not sure, ask your provider.   HOLD: Dulaglutide (Trulicity) for 7 days prior to the procedure.  HOLD: Canagliflozin (Invokana) for 3 days prior to the procedure. Last dose on Monday, November 18.   HOLD Furosemide/lasix the morning of procedure  HOLD Humulin R insulin for morning and lunch time doses on day of procedure   LABS: to  be done by Wednesday, 08/20/23  FYI:  For your safety, and to allow [place] to monitor your vital signs accurately during the surgery/procedure we request: If you have artificial nails, gel coating, SNS etc, please have those removed prior to your surgery/procedure. Not having the nail coverings /polish removed may result in cancellation or delay of your surgery/procedure.  Your support person will be asked to wait in the waiting room during your procedure.  It is OK to have someone drop you off and come back when you are ready to be discharged.  You cannot drive after the procedure and will need someone to drive you home.  Bring your insurance cards.  *Special Note: Every effort is made to have your procedure done on time. Occasionally there are emergencies that occur at the hospital that may cause delays. Please be patient if a delay does occur.       Signed, [name], MD  08/15/2023 4:56 PM    [facility]

## 2023-08-15 NOTE — Progress Notes (Signed)
Cardiology Office Note:    Date:  08/15/2023   ID:  Yudany, Domico May 22, 1946, MRN [id number]  PCP:  Jeoffrey Massed, MD   [facility] Providers Cardiologist:  Olga Millers, MD     Referring MD: Jeoffrey Massed, MD   Chief Complaint  Patient presents with   Atrial Fibrillation    History of Present Illness:    ANN CRANSON is a 77 y.o. female with a hx of paroxysmal atrial fibrillation status post left atrial appendage occlusion January 22, 2023.  She was treated with a 24 mm Watchman Flex Pro device.  Her procedure was uncomplicated and she transition from rivaroxaban to clopidogrel at 6 weeks and then from clopidogrel to aspirin 81 mg at 6 months.  After the procedure a CT scan for surveillance was performed and showed a 3 to 4 mm device leak.  A repeat scan at 6 months was completed and demonstrated a 5.8 mm gap on the mitral side of the device with filling of the secondary posterior lobe of the appendage.  Device compression is 13%.  There is no device related thrombus present.  The patient is clinically doing well.  This is a phone conversation today as she was unable to come into the office.  She is doing fine and has not had any recent symptoms of heart palpitation or anything suggestive of recurrent atrial fibrillation.  She denies any further epistaxis since she has been off of anticoagulation.  She has no chest pain, chest pressure, or shortness of breath.   Current Medications: Current Meds  Medication Sig   Accu-Chek Softclix Lancets lancets USE TO CHECK BLOOD GLUCOSE UP TO 6 TIMES DAILY AS DIRECTED   acetaminophen (TYLENOL) 325 MG tablet Take 650 mg by mouth every 6 (six) hours as needed for moderate pain or headache.   aspirin EC 81 MG tablet Take 1 tablet (81 mg total) by mouth daily. Swallow whole.   blood glucose meter kit and supplies KIT Use up to six times daily as directed. DX. E11.9   canagliflozin (INVOKANA) 100 MG TABS tablet Take 1 tablet  (100 mg total) by mouth daily before breakfast.   citalopram (CELEXA) 20 MG tablet Take 1 tablet (20 mg total) by mouth daily.   dicyclomine (BENTYL) 10 MG capsule TAKE 1 CAPSULE (10 MG TOTAL) BY MOUTH EVERY 8 (EIGHT) HOURS AS NEEDED FOR SPASMS (ABDOMINAL PAIN).   diphenoxylate-atropine (LOMOTIL) 2.5-0.025 MG tablet Take 1 tablet by mouth 4 (four) times daily as needed for diarrhea or loose stools.   Dulaglutide (TRULICITY) 1.5 MG/0.5ML SOPN INJECT 1.5MG  ONCE WEEKLY   esomeprazole (NEXIUM) 20 MG capsule TAKE 1 CAPSULE BY MOUTH DAILY BEFORE BREAKFAST   furosemide (LASIX) 40 MG tablet Take 1 tablet (40 mg total) by mouth daily.   gabapentin (NEURONTIN) 600 MG tablet TAKE 1 AND 1/2 TABLETS BY MOUTH TWICE A DAY. MUST KEEP APPT FOR FURTHER REFILLS   glucose blood (ACCU-CHEK GUIDE) test strip USE TO CHECK BLOOD GLUCOSE UP TO 6 TIMES DAILY AS DIRECTED   INFLIXIMAB IV Inject into the vein every 8 (eight) weeks. Remicaid 5mg /kg Infusion every 8 weeks [facility])   Insulin Pen Needle (B-D UF III MINI PEN NEEDLES) 31G X 5 MM MISC USE TO INJECT INSULINS EQUAL TO 6 TIMES DAILY.   insulin regular human CONCENTRATED (HUMULIN R U-500 KWIKPEN) 500 UNIT/ML KwikPen Inject 100 units into the skin at breakfast and lunch.   lactase (LACTAID) 3000 UNITS tablet Take  3,000 Units by mouth as needed (when eating foods containing dairy).    meclizine (ANTIVERT) 25 MG tablet Take 1 tablet (25 mg total) by mouth 3 (three) times daily as needed for dizziness or nausea.   metoprolol succinate (TOPROL-XL) 100 MG 24 hr tablet TAKE 1 TABLET BY MOUTH 2 TIMES DAILY. TAKE WITH OR IMMEDIATELY FOLLOWING A MEAL   oxybutynin (DITROPAN XL) 5 MG 24 hr tablet Take 1 tablet (5 mg total) by mouth at bedtime.   potassium chloride (KLOR-CON M10) 10 MEQ tablet Take 1 tablet (10 mEq total) by mouth daily.     Allergies:   Omeprazole, Actos [pioglitazone], Benzocaine-menthol, Colesevelam, Flagyl [metronidazole hcl], Metformin and  related, Shrimp [shellfish allergy], Statins, Allevyn adhesive [wound dressings], Desipramine hcl, Hydromorphone, Jardiance [empagliflozin], Lactose intolerance (gi), Adhesive [tape], Nisoldipine, and Percocet [oxycodone-acetaminophen]   ROS:   Please see the history of present illness.    All other systems reviewed and are negative.  EKGs/Labs/Other Studies Reviewed:    The following studies were reviewed today: Cardiac Studies & Procedures       ECHOCARDIOGRAM  ECHOCARDIOGRAM COMPLETE 08/04/2015  Narrative *[facility]* [address] [place], [place] [address] [phone]  ------------------------------------------------------------------- Transthoracic Echocardiography  Patient:    Sylivia, Lyon MR #:       [id number] Study Date: 08/04/2015 Gender:     F Age:        62 Height:     157.5 cm Weight:     95.7 kg BSA:        2.1 m^2 Pt. Status: Room:       IC08  ADMITTING    Tawni Levy REFERRING    Jodelle Gross ATTENDING    Erick Blinks [id number] SONOGRAPHER  Jeryl Columbia PERFORMING   [facility], Jeani Hawking  cc:  ------------------------------------------------------------------- LV EF: 60% -   65%  ------------------------------------------------------------------- Indications:      Atrial fibrillation - 427.31.  ------------------------------------------------------------------- History:   PMH:  Palpitations.  Atrial fibrillation.  Risk factors: Hypertension. Diabetes mellitus. Dyslipidemia.  ------------------------------------------------------------------- Study Conclusions  - Left ventricle: The cavity size was normal. Wall thickness was increased in a pattern of moderate LVH. Systolic function was normal. The estimated ejection fraction was in the range of 60% to 65%. Wall motion was normal; there were no regional wall motion abnormalities. Doppler parameters are consistent with abnormal left  ventricular relaxation (grade 1 diastolic dysfunction). - Aortic valve: Moderately calcified annulus. Trileaflet; mildly thickened leaflets. Valve area (VTI): 2.21 cm^2. Valve area (Vmax): 2.1 cm^2. - Mitral valve: Mildly calcified annulus. Mildly thickened leaflets . - Left atrium: The atrium was mildly dilated. - Technically adequate study.  Transthoracic echocardiography.  M-mode, complete 2D, spectral Doppler, and color Doppler.  Birthdate:  Patient birthdate: May 11, 1946.  Age:  Patient is 77 yr old.  Sex:  Gender: female. BMI: 38.6 kg/m^2.  Blood pressure:     128/64  Patient status: Inpatient.  Study date:  Study date: 08/04/2015. Study time: 02:56 PM.  Location:  Bedside.  -------------------------------------------------------------------  ------------------------------------------------------------------- Left ventricle:  The cavity size was normal. Wall thickness was increased in a pattern of moderate LVH. Systolic function was normal. The estimated ejection fraction was in the range of 60% to 65%. Wall motion was normal; there were no regional wall motion abnormalities. Doppler parameters are consistent with abnormal left ventricular relaxation (grade 1 diastolic dysfunction).  ------------------------------------------------------------------- Aortic valve:   Moderately calcified annulus. Trileaflet; mildly  thickened leaflets.  Doppler:   There was no stenosis.   There was no significant regurgitation.    VTI ratio of LVOT to aortic valve: 0.58. Valve area (VTI): 2.21 cm^2. Indexed valve area (VTI): 1.05 cm^2/m^2. Peak velocity ratio of LVOT to aortic valve: 0.55. Valve area (Vmax): 2.1 cm^2. Indexed valve area (Vmax): 1 cm^2/m^2. Mean gradient (S): 6 mm Hg. Peak gradient (S): 11 mm Hg.  ------------------------------------------------------------------- Aorta:  Aortic root: The aortic root was normal in  size.  ------------------------------------------------------------------- Mitral valve:   Mildly calcified annulus. Mildly thickened leaflets .  Doppler:   There was no evidence for stenosis.   There was no significant regurgitation.    Peak gradient (D): 3 mm Hg.  ------------------------------------------------------------------- Left atrium:  The atrium was mildly dilated.  ------------------------------------------------------------------- Atrial septum:  Poorly visualized.  ------------------------------------------------------------------- Right ventricle:  The cavity size was normal. Systolic function was normal.  ------------------------------------------------------------------- Pulmonic valve:   Not well visualized.  Doppler:   There was no evidence for stenosis.   There was no significant regurgitation.  ------------------------------------------------------------------- Tricuspid valve:   Normal thickness leaflets.  Doppler:   There was no evidence for stenosis.   There was no significant regurgitation.  ------------------------------------------------------------------- Pulmonary artery:    Systolic pressure could not be accurately estimated.   Inadequate TR jet.  ------------------------------------------------------------------- Right atrium:  The atrium was normal in size.  ------------------------------------------------------------------- Pericardium:  There was no pericardial effusion.  ------------------------------------------------------------------- Systemic veins: Inferior vena cava: The vessel was normal in size. The respirophasic diameter changes were in the normal range (>= 50%), consistent with normal central venous pressure.  ------------------------------------------------------------------- Measurements  Left ventricle                           Value           Reference LV ID, ED, PLAX chordal          (L)     41.9   mm       43 - 52 LV  ID, ES, PLAX chordal                  28.7   mm       23 - 38 LV fx shortening, PLAX chordal           32     %        >=29 LV PW thickness, ED                      14.6   mm       --------- IVS/LV PW ratio, ED                      0.94            <=1.3  Ventricular septum                       Value           Reference IVS thickness, ED                        13.7   mm       ---------  LVOT  Value           Reference LVOT ID, S                               22     mm       --------- LVOT area                                3.8    cm^2     --------- LVOT peak velocity, S                    91.32  cm/s     --------- LVOT VTI, S                              23.79  cm       ---------  Aortic valve                             Value           Reference Aortic valve peak velocity, S            165.6  cm/s     --------- Aortic valve mean velocity, S            114.06 cm/s     --------- Aortic valve VTI, S                      40.96  cm       --------- Aortic mean gradient, S                  6      mm Hg    --------- Aortic peak gradient, S                  11     mm Hg    --------- VTI ratio, LVOT/AV                       0.58            --------- Aortic valve area, VTI                   2.21   cm^2     --------- Aortic valve area/bsa, VTI               1.05   cm^2/m^2 --------- Velocity ratio, peak, LVOT/AV            0.55            --------- Aortic valve area, peak velocity         2.1    cm^2     --------- Aortic valve area/bsa, peak              1      cm^2/m^2 --------- velocity  Aorta                                    Value           Reference Aortic root ID, ED  33     mm       ---------  Left atrium                              Value           Reference LA ID, A-P, ES                           38     mm       --------- LA volume/bsa, ES, 1-p A4C               29.9   ml/m^2   ---------  Mitral valve                              Value           Reference Mitral E-wave peak velocity              88.8   cm/s     --------- Mitral A-wave peak velocity              116    cm/s     --------- Mitral deceleration time                 229    ms       150 - 230 Mitral peak gradient, D                  3      mm Hg    --------- Mitral E/A ratio, peak                   0.8             ---------  Right ventricle                          Value           Reference TAPSE                                    23     mm       ---------  Legend: (L)  and  (H)  mark values outside specified reference range.  ------------------------------------------------------------------- Prepared and Electronically Authenticated by  Patrick Jupiter, M.D. 2016-11-04T15:30:18   TEE  ECHO TEE 01/23/2023  Narrative TRANSESOPHOGEAL ECHO REPORT    Patient Name:   AVANELL CAJINA Date of Exam: 01/23/2023 Medical Rec #:  [id number]     Height:       62.0 in Accession #:    [id number]    Weight:       226.0 lb Date of Birth:  1946/06/29    BSA:          2.014 m Patient Age:    76 years      BP:           175/97 mmHg Patient Gender: F             HR:           68 bpm. Exam Location:  Inpatient  Procedure: Transesophageal Echo, 3D Echo, Color Doppler and Cardiac Doppler  Indications:     I48.1 Persistent atrial fibrillation  History:         Patient has prior history of Echocardiogram examinations, most recent 08/04/2015. Arrythmias:Atrial Fibrillation; Risk Factors:Hypertension, Sleep Apnea, Diabetes and Dyslipidemia.  Sonographer:     Irving Burton Senior RDCS Referring Phys:  [id number] Brittiney Dicostanzo Diagnosing Phys: Thurmon Fair MD   Sonographer Comments: 24mm Watchman Device Implanted   PROCEDURE: After discussion of the risks and benefits of a TEE, an informed consent was obtained from the patient. The transesophogeal probe was passed without difficulty through the esophogus of the patient. Sedation performed by different physician. The  patient was monitored while under deep sedation. The patient's vital signs; including heart rate, blood pressure, and oxygen saturation; remained stable throughout the procedure. The patient developed no complications during the procedure.  TEE was used for baseline evaluation and device selection, to guide transseptal puncture and device deployment and to assess the final result and potential complications.  IMPRESSIONS   1. After the procedure, there is a well-seated 24 mm Watchman FLX atrial appendage occluder device with adequate compression and no evidence of peri-device leak. 2. At the end of the procedure there is a very small iatrogenic atrial septal defect with left-to-right shunting. 3. Left ventricular ejection fraction, by estimation, is 60 to 65%. The left ventricle has normal function. The left ventricle has no regional wall motion abnormalities. 4. Right ventricular systolic function is normal. The right ventricular size is normal. 5. No left atrial/left atrial appendage thrombus was detected. 6. The mitral valve is normal in structure. Trivial mitral valve regurgitation. No evidence of mitral stenosis. 7. The aortic valve is tricuspid. Aortic valve regurgitation is not visualized. No aortic stenosis is present. 8. The inferior vena cava is normal in size with greater than 50% respiratory variability, suggesting right atrial pressure of 3 mmHg.  FINDINGS Left Ventricle: Left ventricular ejection fraction, by estimation, is 60 to 65%. The left ventricle has normal function. The left ventricle has no regional wall motion abnormalities. The left ventricular internal cavity size was normal in size. There is no left ventricular hypertrophy.  Right Ventricle: The right ventricular size is normal. No increase in right ventricular wall thickness. Right ventricular systolic function is normal.  Left Atrium: Left atrial size was normal in size. No left atrial/left atrial appendage thrombus  was detected.  Right Atrium: Right atrial size was normal in size.  Pericardium: There is no evidence of pericardial effusion.  Mitral Valve: The mitral valve is normal in structure. Trivial mitral valve regurgitation. No evidence of mitral valve stenosis.  Tricuspid Valve: The tricuspid valve is normal in structure. Tricuspid valve regurgitation is not demonstrated. No evidence of tricuspid stenosis.  Aortic Valve: The aortic valve is tricuspid. Aortic valve regurgitation is not visualized. No aortic stenosis is present.  Pulmonic Valve: The pulmonic valve was normal in structure. Pulmonic valve regurgitation is trivial. No evidence of pulmonic stenosis.  Aorta: The aortic root is normal in size and structure.  Venous: The inferior vena cava is normal in size with greater than 50% respiratory variability, suggesting right atrial pressure of 3 mmHg.  IAS/Shunts: No atrial level shunt detected by color flow Doppler.  Additional Comments: Spectral Doppler performed.  Thurmon Fair MD Electronically signed by Thurmon Fair MD Signature Date/Time: 01/23/2023/1:45:57 PM    Final            EKG:        Recent Labs: 10/09/2022: Magnesium 2.2; TSH 0.618 05/08/2023: ALT 15 05/29/2023: BUN 8; Hemoglobin 8.8 Repeated and verified X2.; Platelets  134.0; Potassium 3.5; Sodium 139 07/23/2023: Creatinine, Ser 0.80  Recent Lipid Panel    Component Value Date/Time   CHOL 149 05/06/2023 1450   TRIG 123 05/06/2023 1450   TRIG 196 01/26/2010 0000   HDL 38 (L) 05/06/2023 1450   CHOLHDL 3.9 05/06/2023 1450   CHOLHDL 5 08/28/2022 1019   VLDL 27.8 08/28/2022 1019   LDLCALC 89 05/06/2023 1450   LDLDIRECT 69.5 01/24/2011 1125     Risk Assessment/Calculations:    CHA2DS2-VASc Score = 5   This indicates a 7.2% annual risk of stroke. The patient's score is based upon: CHF History: 0 HTN History: 1 Diabetes History: 1 Stroke History: 0 Vascular Disease History: 0 Age Score: 2 Gender  Score: 1            Physical Exam:    VS:  Ht 5\' 2"  (1.575 m)   BMI 39.58 kg/m     Wt Readings from Last 3 Encounters:  08/08/23 216 lb 6.4 oz (98.2 kg)  07/23/23 225 lb (102.1 kg)  07/22/23 225 lb 3.2 oz (102.2 kg)    Exam is deferred due to telephone type encounter Assessment & Plan Presence of Watchman left atrial appendage closure device CT study reviewed.  Images personally reviewed with our imaging specialist.  Patient with an approximate 5 mm device leak by CT angiography.  The patient is clinically doing well on aspirin 81 mg daily.  We discussed the clinical equipoise in this situation.  We discussed potential treatment strategies which include resumption of oral anticoagulation, continuation of low-dose antiplatelet therapy, further diagnostic testing with a transesophageal echo, and the possibility of device closure of the residual gap at the base of the device.  After discussion of treatment options and shared decision making conversation, I have recommended that the patient proceed with transesophageal echo for further assessment of her Watchman FLX peri-device leak. Since all of the data regarding cessation of anticoagulation comes from TEE measurement of device leak, I favor proceeding with TEE for further assessment.  Pre-procedural cardiovascular examination Will check labs for TEE.  PAF (paroxysmal atrial fibrillation) (HCC) Well-controlled. On ASA now over 6 months out from [facility] implantation. Final recommendations pending TEE assessment.        Informed Consent   Shared Decision Making/Informed Consent   The risks [esophageal damage, perforation (1:10,000 risk), bleeding, pharyngeal hematoma as well as other potential complications associated with conscious sedation including aspiration, arrhythmia, respiratory failure and death], benefits (treatment guidance and diagnostic support) and alternatives of a transesophageal echocardiogram were discussed in detail  with Ms. Rockmore and she is willing to proceed.        Medication Adjustments/Labs and Tests Ordered: Current medicines are reviewed at length with the patient today.  Concerns regarding medicines are outlined above.  Orders Placed This Encounter  Procedures   CBC   Basic metabolic panel   No orders of the defined types were placed in this encounter.   Patient Instructions  Medication Instructions:  Your physician recommends that you continue on your current medications as directed. Please refer to the Current Medication list given to you today. *If you need a refill on your cardiac medications before your next appointment, please call your pharmacy*  Lab Work: CBC, BMET in next 2-3 days If you have labs (blood work) drawn today and your tests are completely normal, you will receive your results only by: MyChart Message (if you have MyChart) OR A paper copy in the mail If you have any lab  test that is abnormal or we need to change your treatment, we will call you to review the results.  Testing/Procedures: Transesophageal Echocardiogram Your physician has requested that you have a TEE. During a TEE, sound waves are used to create images of your heart. It provides your doctor with information about the size and shape of your heart and how well your heart's chambers and valves are working. In this test, a transducer is attached to the end of a flexible tube that's guided down your throat and into your esophagus (the tube leading from you mouth to your stomach) to get a more detailed image of your heart. You are not awake for the procedure. Please see the instruction sheet given to you today. For further information please visit [email or web address]  Follow-Up: At [facility], you and your health needs are our priority.  As part of our continuing mission to provide you with exceptional heart care, we have created designated Provider Care Teams.  These Care Teams include your  primary Cardiologist (physician) and Advanced Practice Providers (APPs -  Physician Assistants and Nurse Practitioners) who all work together to provide you with the care you need, when you need it.  We recommend signing up for the patient portal called "MyChart".  Sign up information is provided on this After Visit Summary.  MyChart is used to connect with patients for Virtual Visits (Telemedicine).  Patients are able to view lab/test results, encounter notes, upcoming appointments, etc.  Non-urgent messages can be sent to your provider as well.   To learn more about what you can do with MyChart, go to [email or web address].    Your next appointment:   Structural Team will follow-up  Provider:   [name], MD      Other Instructions    Dear [name]  You are scheduled for a TEE (Transesophageal Echocardiogram) on Friday, November 22 with Dr. Flora Lipps.  Please arrive at the [facility] (Main Entrance A) at [facility]: [address] [place], [place] [address] at 12:00 PM (This time is one hour(s) before your procedure to ensure your preparation).   Free valet parking service is available. You will check in at ADMITTING.   *Please Note: You will receive a call the day before your procedure to confirm the appointment time. That time may have changed from the original time based on the schedule for that day.*   DIET:  Nothing to eat or drink after midnight except a sip of water with medications (see medication instructions below)  MEDICATION INSTRUCTIONS: !!IF ANY NEW MEDICATIONS ARE STARTED AFTER TODAY, PLEASE NOTIFY YOUR PROVIDER AS SOON AS POSSIBLE!!  FYI: Medications such as Semaglutide (Ozempic, [place]), Tirzepatide (Mounjaro, Zepbound), Dulaglutide (Trulicity), etc ("GLP1 agonists") AND Canagliflozin (Invokana), Dapagliflozin (Farxiga), Empagliflozin (Jardiance), Ertugliflozin (Ste[place]atro), Bexagliflozin [employer]) or any combination with one of these drugs such as  Invokamet (Canagliflozin/Metformin), Synjardy (Empagliflozin/Metformin), etc ("SGLT2 inhibitors") must be held around the time of a procedure. This is not a comprehensive list of all of these drugs. Please review all of your medications and talk to your provider if you take any one of these. If you are not sure, ask your provider.   HOLD: Dulaglutide (Trulicity) for 7 days prior to the procedure.  HOLD: Canagliflozin (Invokana) for 3 days prior to the procedure. Last dose on Monday, November 18.   HOLD Furosemide/lasix the morning of procedure  HOLD Humulin R insulin for morning and lunch time doses on day of procedure   LABS: to  be done by Wednesday, 08/20/23  FYI:  For your safety, and to allow [place] to monitor your vital signs accurately during the surgery/procedure we request: If you have artificial nails, gel coating, SNS etc, please have those removed prior to your surgery/procedure. Not having the nail coverings /polish removed may result in cancellation or delay of your surgery/procedure.  Your support person will be asked to wait in the waiting room during your procedure.  It is OK to have someone drop you off and come back when you are ready to be discharged.  You cannot drive after the procedure and will need someone to drive you home.  Bring your insurance cards.  *Special Note: Every effort is made to have your procedure done on time. Occasionally there are emergencies that occur at the hospital that may cause delays. Please be patient if a delay does occur.       Signed, [name], MD  08/15/2023 4:56 PM    [facility]

## 2023-08-15 NOTE — Telephone Encounter (Signed)
The patient left a message asking to reschedule her TEE to 11/29. Rescheduled TEE, but called the patient and she did not pick up and her voicemail was full.  Will try again later.

## 2023-08-18 ENCOUNTER — Ambulatory Visit: Payer: Medicare PPO | Admitting: Family Medicine

## 2023-08-18 NOTE — Telephone Encounter (Signed)
The patient now requests to schedule her TEE after but as close to 08/25/23 as possible. Scheduled her for TEE on 08/26/2023 with Dr. Jacques Navy. Ms. Ara understands her arrival time will be 8:45AM for 9:45AM procedure.  She understands all other instructions remain the same (holding Trulicity x 1 week, Invokana x 3 days). She still plans to have labs drawn 11/20. She was grateful for call and agreed with plan.

## 2023-08-22 DIAGNOSIS — D649 Anemia, unspecified: Secondary | ICD-10-CM | POA: Diagnosis not present

## 2023-08-23 LAB — CBC WITH DIFFERENTIAL/PLATELET
Absolute Lymphocytes: 1327 {cells}/uL (ref 850–3900)
Absolute Monocytes: 374 {cells}/uL (ref 200–950)
Basophils Absolute: 21 {cells}/uL (ref 0–200)
Basophils Relative: 0.5 %
Eosinophils Absolute: 101 {cells}/uL (ref 15–500)
Eosinophils Relative: 2.4 %
HCT: 30.9 % — ABNORMAL LOW (ref 35.0–45.0)
Hemoglobin: 8.9 g/dL — ABNORMAL LOW (ref 11.7–15.5)
MCH: 22.9 pg — ABNORMAL LOW (ref 27.0–33.0)
MCHC: 28.8 g/dL — ABNORMAL LOW (ref 32.0–36.0)
MCV: 79.6 fL — ABNORMAL LOW (ref 80.0–100.0)
MPV: 11.4 fL (ref 7.5–12.5)
Monocytes Relative: 8.9 %
Neutro Abs: 2377 {cells}/uL (ref 1500–7800)
Neutrophils Relative %: 56.6 %
Platelets: 131 10*3/uL — ABNORMAL LOW (ref 140–400)
RBC: 3.88 10*6/uL (ref 3.80–5.10)
RDW: 16.8 % — ABNORMAL HIGH (ref 11.0–15.0)
Total Lymphocyte: 31.6 %
WBC: 4.2 10*3/uL (ref 3.8–10.8)

## 2023-08-23 LAB — B12 AND FOLATE PANEL
Folate: 16 ng/mL
Vitamin B-12: 469 pg/mL (ref 200–1100)

## 2023-08-23 LAB — IRON,TIBC AND FERRITIN PANEL
%SAT: 8 % — ABNORMAL LOW (ref 16–45)
Ferritin: 6 ng/mL — ABNORMAL LOW (ref 16–288)
Iron: 28 ug/dL — ABNORMAL LOW (ref 45–160)
TIBC: 335 ug/dL (ref 250–450)

## 2023-08-25 NOTE — Progress Notes (Signed)
Spoke to pt and instructed them to come at 0800 and to be NPO after 0000.  Confirmed no missed doses of AC and instructed to take in AM with a small sip of water.  Confirmed that pt will have a ride home and someone to stay with them for 24 hours after the procedure. Instructed patient to not wear any jewelry or lotion.

## 2023-08-26 ENCOUNTER — Ambulatory Visit (HOSPITAL_COMMUNITY)
Admission: RE | Admit: 2023-08-26 | Discharge: 2023-08-26 | Disposition: A | Discharge status: Home / Self Care | Payer: Medicare PPO | Source: Ambulatory Visit | Attending: Internal Medicine | Admitting: Internal Medicine

## 2023-08-26 ENCOUNTER — Other Ambulatory Visit: Payer: Self-pay

## 2023-08-26 ENCOUNTER — Ambulatory Visit (HOSPITAL_COMMUNITY): Payer: Medicare PPO | Admitting: Anesthesiology

## 2023-08-26 ENCOUNTER — Encounter (HOSPITAL_COMMUNITY): Payer: Self-pay | Admitting: Internal Medicine

## 2023-08-26 ENCOUNTER — Ambulatory Visit (HOSPITAL_BASED_OUTPATIENT_CLINIC_OR_DEPARTMENT_OTHER)
Admission: RE | Admit: 2023-08-26 | Discharge: 2023-08-26 | Disposition: A | Discharge status: Home / Self Care | Payer: Medicare PPO | Source: Ambulatory Visit | Attending: Internal Medicine | Admitting: Internal Medicine

## 2023-08-26 ENCOUNTER — Telehealth: Payer: Self-pay

## 2023-08-26 ENCOUNTER — Encounter (HOSPITAL_COMMUNITY)
Admission: RE | Disposition: A | Discharge status: Home / Self Care | Payer: Self-pay | Source: Ambulatory Visit | Attending: Internal Medicine

## 2023-08-26 DIAGNOSIS — I4819 Other persistent atrial fibrillation: Secondary | ICD-10-CM | POA: Insufficient documentation

## 2023-08-26 DIAGNOSIS — I480 Paroxysmal atrial fibrillation: Secondary | ICD-10-CM | POA: Diagnosis not present

## 2023-08-26 DIAGNOSIS — Z6841 Body Mass Index (BMI) 40.0 and over, adult: Secondary | ICD-10-CM | POA: Insufficient documentation

## 2023-08-26 DIAGNOSIS — E66813 Obesity, class 3: Secondary | ICD-10-CM | POA: Diagnosis not present

## 2023-08-26 DIAGNOSIS — E119 Type 2 diabetes mellitus without complications: Secondary | ICD-10-CM | POA: Diagnosis not present

## 2023-08-26 DIAGNOSIS — Z79899 Other long term (current) drug therapy: Secondary | ICD-10-CM | POA: Diagnosis not present

## 2023-08-26 DIAGNOSIS — Z7982 Long term (current) use of aspirin: Secondary | ICD-10-CM | POA: Insufficient documentation

## 2023-08-26 DIAGNOSIS — Z7985 Long-term (current) use of injectable non-insulin antidiabetic drugs: Secondary | ICD-10-CM | POA: Insufficient documentation

## 2023-08-26 DIAGNOSIS — T85638A Leakage of other specified internal prosthetic devices, implants and grafts, initial encounter: Secondary | ICD-10-CM

## 2023-08-26 DIAGNOSIS — I10 Essential (primary) hypertension: Secondary | ICD-10-CM | POA: Insufficient documentation

## 2023-08-26 DIAGNOSIS — Z794 Long term (current) use of insulin: Secondary | ICD-10-CM | POA: Insufficient documentation

## 2023-08-26 DIAGNOSIS — T82539A Leakage of unspecified cardiac and vascular devices and implants, initial encounter: Secondary | ICD-10-CM

## 2023-08-26 HISTORY — PX: TRANSESOPHAGEAL ECHOCARDIOGRAM (CATH LAB): EP1270

## 2023-08-26 LAB — POCT I-STAT, CHEM 8
BUN: 11 mg/dL (ref 8–23)
Calcium, Ion: 1.15 mmol/L (ref 1.15–1.40)
Chloride: 96 mmol/L — ABNORMAL LOW (ref 98–111)
Creatinine, Ser: 0.7 mg/dL (ref 0.44–1.00)
Glucose, Bld: 287 mg/dL — ABNORMAL HIGH (ref 70–99)
HCT: 31 % — ABNORMAL LOW (ref 36.0–46.0)
Hemoglobin: 10.5 g/dL — ABNORMAL LOW (ref 12.0–15.0)
Potassium: 3.7 mmol/L (ref 3.5–5.1)
Sodium: 139 mmol/L (ref 135–145)
TCO2: 29 mmol/L (ref 22–32)

## 2023-08-26 LAB — ECHO TEE

## 2023-08-26 SURGERY — TRANSESOPHAGEAL ECHOCARDIOGRAM (TEE) (CATHLAB)
Anesthesia: Monitor Anesthesia Care

## 2023-08-26 MED ORDER — PHENYLEPHRINE 80 MCG/ML (10ML) SYRINGE FOR IV PUSH (FOR BLOOD PRESSURE SUPPORT)
PREFILLED_SYRINGE | INTRAVENOUS | Status: DC | PRN
  Administered 2023-08-26: 80 ug via INTRAVENOUS

## 2023-08-26 MED ORDER — PROPOFOL 500 MG/50ML IV EMUL
INTRAVENOUS | Status: DC | PRN
  Administered 2023-08-26: 75 ug/kg/min via INTRAVENOUS

## 2023-08-26 MED ORDER — SUCCINYLCHOLINE CHLORIDE 200 MG/10ML IV SOSY
PREFILLED_SYRINGE | INTRAVENOUS | Status: DC | PRN
  Administered 2023-08-26: 140 mg via INTRAVENOUS

## 2023-08-26 MED ORDER — PROPOFOL 10 MG/ML IV BOLUS
INTRAVENOUS | Status: DC | PRN
  Administered 2023-08-26: 100 mg via INTRAVENOUS
  Administered 2023-08-26: 30 mg via INTRAVENOUS
  Administered 2023-08-26: 50 mg via INTRAVENOUS

## 2023-08-26 MED ORDER — SODIUM CHLORIDE 0.9 % IV SOLN
INTRAVENOUS | Status: DC
  Administered 2023-08-26: 20 mL/h via INTRAVENOUS

## 2023-08-26 MED ORDER — LIDOCAINE 2% (20 MG/ML) 5 ML SYRINGE
INTRAMUSCULAR | Status: DC | PRN
  Administered 2023-08-26: 100 mg via INTRAVENOUS

## 2023-08-26 NOTE — Progress Notes (Signed)
  Echocardiogram Echocardiogram Transesophageal has been performed.  Janalyn Harder 08/26/2023, 11:44 AM

## 2023-08-26 NOTE — CV Procedure (Signed)
INDICATIONS: S/p Watchman, eval leak  PROCEDURE:   Informed consent was obtained prior to the procedure. The risks, benefits and alternatives for the procedure were discussed and the patient comprehended these risks.  Risks include, but are not limited to, cough, sore throat, vomiting, nausea, somnolence, esophageal and stomach trauma or perforation, bleeding, low blood pressure, aspiration, pneumonia, infection, trauma to the teeth and death.    After a procedural time-out, the oropharynx was anesthetized with 20% benzocaine spray.   During this procedure the patient was administered propofol per anesthesia. Patient had laryngospasm and required ETT tube and mechanical ventilation for airway protection.  The patient's heart rate, blood pressure, and oxygen saturation were monitored continuously during the procedure. The period of anesthesia was 30 minutes, of which I was present face-to-face 100% of this time.  The transesophageal probe was inserted in the esophagus without difficulty and multiple views were obtained.  The patient was kept under observation until the patient left the procedure room.  The patient left the procedure room in stable condition.   Agitated microbubble saline contrast was not administered.  COMPLICATIONS:    There were no immediate complications.  FINDINGS:   FORMAL ECHOCARDIOGRAM REPORT PENDING S/p Watchman LAAO with small leak adjacent to the coumadin ridge. Leak is less than 4 mm.   No residual atrial septal defect after transseptal puncture.  RECOMMENDATIONS:    DC when alert. Findings discussed with Dr. Excell Seltzer and Karsten Fells RN.   Time Spent Directly with the Patient:  60 minutes   [name] 08/26/2023, 11:37 AM

## 2023-08-26 NOTE — Transfer of Care (Signed)
Immediate Anesthesia Transfer of Care Note  Patient: [name]  Procedure(s) Performed: TRANSESOPHAGEAL ECHOCARDIOGRAM  Patient Location: Cath Lab  Anesthesia Type:General  Level of Consciousness: awake  Airway & Oxygen Therapy: Patient Spontanous Breathing and Patient connected to face mask oxygen  Post-op Assessment: Report given to RN and Post -op Vital signs reviewed and stable  Post vital signs: Reviewed and stable  Last Vitals:  Vitals Value Taken Time  BP    Temp    Pulse 68 08/26/23 1155  Resp 19 08/26/23 1155  SpO2 98 % 08/26/23 1155  Vitals shown include unfiled device data.  Last Pain:  Vitals:   08/26/23 1151  TempSrc: Temporal  PainSc: 0-No pain         Complications: No notable events documented.

## 2023-08-26 NOTE — Interval H&P Note (Signed)
History and Physical Interval Note:  08/26/2023 9:36 AM  [name]  has presented today for surgery, with the diagnosis of PAROXYSMAL AFIB, s/p Watchman device, eval Watchman.  The various methods of treatment have been discussed with the patient and family. After consideration of risks, benefits and other options for treatment, the patient has consented to  Procedure(s): TRANSESOPHAGEAL ECHOCARDIOGRAM (N/A) as a surgical intervention.  The patient's history has been reviewed, patient examined, no change in status, stable for surgery.  I have reviewed the patient's chart and labs.  Questions were answered to the patient's satisfaction.     [name]

## 2023-08-26 NOTE — Anesthesia Procedure Notes (Addendum)
Procedure Name: Intubation Date/Time: 08/26/2023 11:04 AM  Performed by: Caryn Bee A, CRNAPre-anesthesia Checklist: Patient identified, Emergency Drugs available, Suction available and Patient being monitored Patient Re-evaluated:Patient Re-evaluated prior to induction Oxygen Delivery Method: Circle System Utilized Preoxygenation: Pre-oxygenation with 100% oxygen Induction Type: IV induction Ventilation: Mask ventilation without difficulty Laryngoscope Size: Mac and 3 Grade View: Grade II Tube type: Oral Tube size: 7.5 mm Number of attempts: 1 Airway Equipment and Method: Stylet and Oral airway Placement Confirmation: ETT inserted through vocal cords under direct vision, positive ETCO2 and breath sounds checked- equal and bilateral Secured at: 22 cm Tube secured with: Tape Dental Injury: Teeth and Oropharynx as per pre-operative assessment

## 2023-08-26 NOTE — Telephone Encounter (Signed)
    Reviewed today's TEE results with patient.  She understands to continue same medication regimen. She was grateful for call and agreed with plan.

## 2023-08-26 NOTE — Anesthesia Preprocedure Evaluation (Signed)
Anesthesia Evaluation  Patient identified by MRN, date of birth, ID band Patient awake    Reviewed: Allergy & Precautions, NPO status , Patient's Chart, lab work & pertinent test results  History of Anesthesia Complications Negative for: history of anesthetic complications  Airway Mallampati: IV  TM Distance: >3 FB Neck ROM: Full    Dental  (+) Teeth Intact, Dental Advisory Given   Pulmonary    breath sounds clear to auscultation       Cardiovascular hypertension, (-) angina (-) Past MI + dysrhythmias Atrial Fibrillation  Rhythm:Regular   1. After the procedure, there is a well-seated 24 mm Watchman FLX atrial  appendage occluder device with adequate compression and no evidence of  peri-device leak.   2. At the end of the procedure there is a very small iatrogenic atrial  septal defect with left-to-right shunting.   3. Left ventricular ejection fraction, by estimation, is 60 to 65%. The  left ventricle has normal function. The left ventricle has no regional  wall motion abnormalities.   4. Right ventricular systolic function is normal. The right ventricular  size is normal.   5. No left atrial/left atrial appendage thrombus was detected.   6. The mitral valve is normal in structure. Trivial mitral valve  regurgitation. No evidence of mitral stenosis.   7. The aortic valve is tricuspid. Aortic valve regurgitation is not  visualized. No aortic stenosis is present.   8. The inferior vena cava is normal in size with greater than 50%  respiratory variability, suggesting right atrial pressure of 3 mmHg.     Neuro/Psych    GI/Hepatic hiatal hernia, PUD,GERD  ,,(+) Hepatitis -  Endo/Other  diabetes  Class 3 obesityLab Results      Component                Value               Date                      HGBA1C                   8.5 (A)             05/29/2023                HGBA1C                   8.5                 05/29/2023                 HGBA1C                   8.5 (A)             05/29/2023                HGBA1C                   8.5 (A)             05/29/2023             Renal/GU Lab Results      Component                Value               Date  NA                       139                 08/26/2023                K                        3.7                 08/26/2023                CO2                      27                  05/29/2023                GLUCOSE                  287 (H)             08/26/2023                BUN                      11                  08/26/2023                CREATININE               0.70                08/26/2023                CALCIUM                  8.5                 05/29/2023                GFR                      86.23               05/29/2023                EGFR                     90                  02/25/2023                GFRNONAA                 >60                 10/10/2022                Musculoskeletal   Abdominal   Peds  Hematology  (+) Blood dyscrasia, anemia Lab Results      Component                Value               Date  WBC                      4.2                 08/22/2023                HGB                      10.5 (L)            08/26/2023                HCT                      31.0 (L)            08/26/2023                MCV                      79.6 (L)            08/22/2023                PLT                      131 (L)             08/22/2023              Anesthesia Other Findings   Reproductive/Obstetrics                              Anesthesia Physical Anesthesia Plan  ASA: 3  Anesthesia Plan: MAC and General   Post-op Pain Management: Minimal or no pain anticipated   Induction: Intravenous  PONV Risk Score and Plan: 3 and Propofol infusion and Treatment may vary due to age or medical condition  Airway Management Planned: Nasal Cannula, Natural  Airway, Simple Face Mask and Oral ETT  Additional Equipment:   Intra-op Plan:   Post-operative Plan: Extubation in OR  Informed Consent: I have reviewed the patients History and Physical, chart, labs and discussed the procedure including the risks, benefits and alternatives for the proposed anesthesia with the patient or authorized representative who has indicated his/her understanding and acceptance.     Dental advisory given  Plan Discussed with: CRNA  Anesthesia Plan Comments:          Anesthesia Quick Evaluation

## 2023-08-27 ENCOUNTER — Other Ambulatory Visit (INDEPENDENT_AMBULATORY_CARE_PROVIDER_SITE_OTHER): Payer: Self-pay | Admitting: Gastroenterology

## 2023-08-27 DIAGNOSIS — D500 Iron deficiency anemia secondary to blood loss (chronic): Secondary | ICD-10-CM

## 2023-08-27 DIAGNOSIS — N3001 Acute cystitis with hematuria: Secondary | ICD-10-CM | POA: Diagnosis not present

## 2023-08-27 DIAGNOSIS — R350 Frequency of micturition: Secondary | ICD-10-CM | POA: Diagnosis not present

## 2023-08-27 MED ORDER — FERROUS SULFATE 325 (65 FE) MG PO TABS: 325.0000 mg | ORAL_TABLET | Freq: Every day | ORAL | 3 refills | Status: DC

## 2023-08-30 ENCOUNTER — Other Ambulatory Visit: Payer: Self-pay | Admitting: Family Medicine

## 2023-09-01 ENCOUNTER — Encounter (INDEPENDENT_AMBULATORY_CARE_PROVIDER_SITE_OTHER): Payer: Self-pay

## 2023-09-03 ENCOUNTER — Other Ambulatory Visit: Payer: Self-pay | Admitting: Family Medicine

## 2023-09-04 ENCOUNTER — Encounter (HOSPITAL_COMMUNITY): Payer: Self-pay | Admitting: Internal Medicine

## 2023-09-04 NOTE — Anesthesia Postprocedure Evaluation (Signed)
Anesthesia Post Note  Patient: [name]  Procedure(s) Performed: TRANSESOPHAGEAL ECHOCARDIOGRAM     Patient location during evaluation: Cath Lab Anesthesia Type: General Level of consciousness: awake and alert Pain management: pain level controlled Vital Signs Assessment: post-procedure vital signs reviewed and stable Respiratory status: spontaneous breathing, nonlabored ventilation and respiratory function stable Cardiovascular status: blood pressure returned to baseline and stable Postop Assessment: no apparent nausea or vomiting Anesthetic complications: no   No notable events documented.  Last Vitals:  Vitals:   08/26/23 1225 08/26/23 1230  BP: (!) 120/54 (!) 121/59  Pulse: 65 62  Resp: 14 15  Temp:  36.7 C  SpO2: 97% 94%    Last Pain:  Vitals:   08/26/23 1230  TempSrc:   PainSc: 0-No pain                 [name]

## 2023-09-10 ENCOUNTER — Ambulatory Visit: Payer: Medicare PPO | Admitting: Family Medicine

## 2023-09-11 ENCOUNTER — Telehealth: Payer: Self-pay

## 2023-09-11 DIAGNOSIS — N390 Urinary tract infection, site not specified: Secondary | ICD-10-CM

## 2023-09-11 DIAGNOSIS — R350 Frequency of micturition: Secondary | ICD-10-CM | POA: Diagnosis not present

## 2023-09-11 NOTE — Telephone Encounter (Signed)
 Okay, referral ordered

## 2023-09-11 NOTE — Telephone Encounter (Signed)
Patient is being seen at [facility] again for urinary issues. Patient would like referral to Urologist. She has been several times by Dr. Milinda Cave regarding issue.  Med refill requests.  Prescription Request  09/11/2023  LOV: Visit date not found  What is the name of the medication or equipment?  1) potassium chloride (KLOR-CON M10) 10 MEQ tablet  2) gabapentin (NEURONTIN) 600 MG tablet   Have you contacted your pharmacy to request a refill? Yes   Which pharmacy would you like this sent to?  [facility] [phone] [place], [place] - [address] [place] [address] Phone: [phone] Fax: [phone]  [facility] - [place], [place] - [address] [place] [address] Phone: [phone] Fax: [phone]    Patient notified that their request is being sent to the clinical staff for review and that they should receive a response within 2 business days.   Please advise at Mobile [phone] (mobile)

## 2023-09-12 NOTE — Telephone Encounter (Signed)
Pt.notified

## 2023-09-17 ENCOUNTER — Ambulatory Visit (HOSPITAL_BASED_OUTPATIENT_CLINIC_OR_DEPARTMENT_OTHER): Payer: Medicare PPO | Admitting: Certified Nurse Midwife

## 2023-09-17 ENCOUNTER — Encounter (HOSPITAL_BASED_OUTPATIENT_CLINIC_OR_DEPARTMENT_OTHER): Payer: Self-pay | Admitting: Certified Nurse Midwife

## 2023-09-17 VITALS — BP 140/88 | HR 102 | Ht 62.0 in | Wt 200.0 lb

## 2023-09-17 DIAGNOSIS — R32 Unspecified urinary incontinence: Secondary | ICD-10-CM

## 2023-09-17 DIAGNOSIS — N390 Urinary tract infection, site not specified: Secondary | ICD-10-CM

## 2023-09-17 DIAGNOSIS — R319 Hematuria, unspecified: Secondary | ICD-10-CM | POA: Diagnosis not present

## 2023-09-17 LAB — POCT URINALYSIS DIPSTICK
Bilirubin, UA: NEGATIVE
Glucose, UA: NEGATIVE
Ketones, UA: NEGATIVE
Leukocytes, UA: NEGATIVE
Nitrite, UA: NEGATIVE
Protein, UA: POSITIVE — AB
Spec Grav, UA: >=1.03 — AB (ref 1.010–1.025)
Urobilinogen, UA: 0.2 U/dL
pH, UA: 5.5 (ref 5.0–8.0)

## 2023-09-17 NOTE — Progress Notes (Unsigned)
[name] is a 77yo postmenopausal female here for problem gyn visit. Her supportive spouse accompanies her. Hx Hysterectomy and BSO. She is currently taking an antibiotic for a UTI diagnosed on 09/11/23. Pt reports that she continues to have urinary frequency and incontinence. She denies dysuria. Denies fever or chills. She denies ever having had incontinence prior to 1 month ago. She asked "Can I have something for pain" due to "hip pain". States muscle relaxants "don't help" and "Tramadol doesn't help". CNM explained to patient that I would prefer she discuss the hip pain issue with her Primary Care Provider.   GYNECOLOGY  VISIT  CC:   hip pain  HPI: 77 y.o. G1P1 Married White or Caucasian female here for hip pain, urinary frequency, urinary incontinence, frequent UTI.   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 ? to get liver bx if she gets bariatric surgery? Mild portal hypertensive gastropathy on EGD 08/2019.   Fibromyalgia    GAD (generalized anxiety disorder)    GERD    History of hiatal hernia    History of iron deficiency anemia 12/2018   Inadequate absorption secondary to chronic/long term PPI therapy + portal hypertensive gastroduodenopathy. No GIB found on EGD, colonosc, and givens. Iron infusions X multiple.   History of thrombocytopenia 12/2011   Hyperlipidemia    Intolerant of statins   HYPERTENSION    IBS (irritable bowel syndrome)    -D.  Good response to bentyl and imodium as of 06/2018 GI f/u.   IDDM (insulin dependent diabetes mellitus)    with DPN (managed by Dr. Elvera Lennox but then in 2018 pt preferred to have me manage for her convenience)   Limited mobility    Requires a walker for arthritic pain, widespread musculoskeletal pain, and neuropathic pain.   Morbid obesity (HCC)    As of 11/2018, pt considering sleeve  gastectomy vs bipass as of eval by Dr. Alvino Blood considering as of 12/2019.   Nonalcoholic steatohepatitis    Viral Hep screens NEG.  CT 2015.  Transaminasemia.  U/S 07/2018 showed early changes of cirrhosis.   OSA (obstructive sleep apnea) 09/14/2015   sleep study 09/07/15: severe obstructive sleep apnea with an AHI of 72 and SaO2 low of 75%.>referred to sleep MD   Osteoarthritis    hips, shoulders, knees   PAF (paroxysmal atrial fibrillation) (HCC)    One documented episode (after getting EGD 2016).  Was on amiodarone x 3 mo.  Rate control with metoprolol + anticoag with xarelto. Watchman 12/2022, off anticoag   Recurrent epistaxis    Granuloma in L nare cauterized by ENT 04/2020. Another cautery 06/2020   Small fiber neuropathy    Due to DM.  Symmetric hands and feet tingling/numbness.   Ulcerative colitis (HCC)    Remicade infusion Q 8 weeks: in clinical and endoscopic remission as of 12/2018 GI f/u.  06/11/19 rpt colonoscopy->cecal and ascending colon colitis.    MEDS:   Current Outpatient Medications on File Prior to Visit  Medication Sig Dispense Refill   Accu-Chek Softclix Lancets lancets USE TO CHECK BLOOD GLUCOSE UP TO 6 TIMES DAILY AS DIRECTED 200 each 5   acetaminophen (TYLENOL) 325 MG tablet Take 650 mg by mouth every 6 (six) hours as needed for moderate pain or headache.     aspirin EC 81 MG tablet Take 1 tablet (81 mg total)  by mouth daily. Swallow whole.     blood glucose meter kit and supplies KIT Use up to six times daily as directed. DX. E11.9 1 each 0   canagliflozin (INVOKANA) 100 MG TABS tablet Take 1 tablet (100 mg total) by mouth daily before breakfast. 90 tablet 1   ciprofloxacin (CIPRO) 500 MG tablet Take by mouth.     citalopram (CELEXA) 20 MG tablet Take 1 tablet (20 mg total) by mouth daily. 90 tablet 1   dicyclomine (BENTYL) 10 MG capsule TAKE 1 CAPSULE (10 MG TOTAL) BY MOUTH EVERY 8 (EIGHT) HOURS AS NEEDED FOR SPASMS (ABDOMINAL PAIN). 270 capsule 1    diphenoxylate-atropine (LOMOTIL) 2.5-0.025 MG tablet Take 1 tablet by mouth 4 (four) times daily as needed for diarrhea or loose stools. 60 tablet 2   Dulaglutide (TRULICITY) 1.5 MG/0.5ML SOPN INJECT 1.5MG  ONCE WEEKLY 18 mL 3   esomeprazole (NEXIUM) 20 MG capsule TAKE 1 CAPSULE BY MOUTH DAILY BEFORE BREAKFAST 90 capsule 3   ferrous sulfate 325 (65 FE) MG tablet Take 1 tablet (325 mg total) by mouth daily with breakfast. 90 tablet 3   furosemide (LASIX) 40 MG tablet Take 1 tablet (40 mg total) by mouth daily. 90 tablet 1   gabapentin (NEURONTIN) 600 MG tablet TAKE 1 AND 1/2 TABLETS BY MOUTH TWICE A DAY. MUST KEEP APPT FOR FURTHER REFILLS 90 tablet 0   glucose blood (ACCU-CHEK GUIDE) test strip USE TO CHECK BLOOD GLUCOSE UP TO 6 TIMES DAILY AS DIRECTED 600 strip 3   INFLIXIMAB IV Inject into the vein every 8 (eight) weeks. Remicaid 5mg /kg Infusion every 8 weeks [facility])     Insulin Pen Needle (B-D UF III MINI PEN NEEDLES) 31G X 5 MM MISC USE TO INJECT INSULINS EQUAL TO 6 TIMES DAILY. 500 each 6   insulin regular human CONCENTRATED (HUMULIN R U-500 KWIKPEN) 500 UNIT/ML KwikPen Inject 100 units into the skin at breakfast and lunch. 48 mL 1   lactase (LACTAID) 3000 UNITS tablet Take 3,000 Units by mouth as needed (when eating foods containing dairy).      meclizine (ANTIVERT) 25 MG tablet Take 1 tablet (25 mg total) by mouth 3 (three) times daily as needed for dizziness or nausea. 90 tablet 0   methocarbamol (ROBAXIN) 500 MG tablet TAKE 1 TABLET BY MOUTH EVERY 8 HOURS AS NEEDED FOR MUSCLE SPASMS. 20 tablet 0   metoprolol succinate (TOPROL-XL) 100 MG 24 hr tablet TAKE 1 TABLET BY MOUTH 2 TIMES DAILY. TAKE WITH OR IMMEDIATELY FOLLOWING A MEAL 180 tablet 1   oxybutynin (DITROPAN XL) 5 MG 24 hr tablet Take 1 tablet (5 mg total) by mouth at bedtime. 30 tablet 0   potassium chloride (KLOR-CON M10) 10 MEQ tablet Take 1 tablet (10 mEq total) by mouth daily. 90 tablet 1   [DISCONTINUED]  bromocriptine (PARLODEL) 2.5 MG tablet Take 2.5 mg by mouth 2 (two) times daily.     No current facility-administered medications on file prior to visit.    ALLERGIES: Flagyl [metronidazole hcl], Omeprazole, Actos [pioglitazone], Benzocaine-menthol, Metformin and related, Shrimp [shellfish allergy], Statins, Welchol [colesevelam], Allevyn adhesive [wound dressings], Desipramine hcl, Hydromorphone, Jardiance [empagliflozin], Lactose intolerance (gi), Adhesive [tape], Nisoldipine, and Percocet [oxycodone-acetaminophen]  SH:  lives with her supportive spouse  PHYSICAL EXAMINATION:    BP (!) 140/88 (BP Location: Left Arm, Patient Position: Sitting, Cuff Size: Large)   Pulse (!) 102   Ht 5\' 2"  (1.575 m)   Wt 200 lb (90.7 kg)   BMI  36.58 kg/m     General appearance: alert, cooperative and appears stated age  Assessment/Plan: 1. Recurrent UTI (Primary) - Pt agreeable to referral to Urogynecology due to several urinary issues including incontinence/recurrent UTI/hematuria - POCT Urinalysis Dipstick - Ambulatory referral to Urogynecology  2. Urinary incontinence, unspecified type - Ambulatory referral to Urogynecology  3. Hematuria, unspecified type - Ambulatory referral to Urogynecology  Pt/spouse agree that they will notify her Primary Care Provider of the hip pain not relieved by muscle relaxers and/or Tramadol. Pt aware that I cannot order urine culture today because she is currently taking antibiotics. She may RTO in one week for Nurse Visit for urine culture if desired. Letta Kocher

## 2023-09-18 NOTE — Patient Instructions (Addendum)
20    Your procedure is scheduled on: 09/25/2023  Report to [facility] Main Entrance at   8:00  AM.  Call this number if you have problems the morning of surgery: [phone]   Remember:   Follow instructions on letter from office regarding when to stop eating and drinking        No Smoking the day of procedure      Take these medicines the morning of surgery with A SIP OF WATER: Celexa, Nexium, Gabapentin, metoprolol, and robaxin if needed  Hold Iron and Trulicity for 7 days prior to procedure  No diabetic medication am of procedure   Do not wear jewelry, make-up or nail polish.  Do not wear lotions, powders, or perfumes. You may wear deodorant.                Do not bring valuables to the hospital.  Contacts, dentures or bridgework may not be worn into surgery.  Leave suitcase in the car. After surgery it may be brought to your room.  For patients admitted to the hospital, checkout time is 11:00 AM the day of discharge.   Patients discharged the day of surgery will not be allowed to drive home. Upper Endoscopy, Adult Upper endoscopy is a procedure to look inside the upper GI (gastrointestinal) tract. The upper GI tract is made up of: The part of the body that moves food from your mouth to your stomach (esophagus). The stomach. The first part of your small intestine (duodenum). This procedure is also called esophagogastroduodenoscopy (EGD) or gastroscopy. In this procedure, your health care provider passes a thin, flexible tube (endoscope) through your mouth and down your esophagus into your stomach. A small camera is attached to the end of the tube. Images from the camera appear on a monitor in the exam room. During this procedure, your health care provider may also remove a small piece of tissue to be sent to a lab and examined under a microscope (biopsy). Your health care provider may do an upper endoscopy to diagnose cancers of the upper GI tract. You may also have this procedure  to find the cause of other conditions, such as: Stomach pain. Heartburn. Pain or problems when swallowing. Nausea and vomiting. Stomach bleeding. Stomach ulcers. Tell a health care provider about: Any allergies you have. All medicines you are taking, including vitamins, herbs, eye drops, creams, and over-the-counter medicines. Any problems you or family members have had with anesthetic medicines. Any blood disorders you have. Any surgeries you have had. Any medical conditions you have. Whether you are pregnant or may be pregnant. What are the risks? Generally, this is a safe procedure. However, problems may occur, including: Infection. Bleeding. Allergic reactions to medicines. A tear or hole (perforation) in the esophagus, stomach, or duodenum. What happens before the procedure? Staying hydrated Follow instructions from your health care provider about hydration, which may include: Up to 4 hours before the procedure - you may continue to drink clear liquids, such as water, clear fruit juice, black coffee, and plain tea.   Medicines Ask your health care provider about: Changing or stopping your regular medicines. This is especially important if you are taking diabetes medicines or blood thinners. Taking medicines such as aspirin and ibuprofen. These medicines can thin your blood. Do not take these medicines unless your health care provider tells you to take them. Taking over-the-counter medicines, vitamins, herbs, and supplements. General instructions Plan to have someone take you home from the hospital  or clinic. If you will be going home right after the procedure, plan to have someone with you for 24 hours. Ask your health care provider what steps will be taken to help prevent infection. What happens during the procedure?  An IV will be inserted into one of your veins. You may be given one or more of the following: A medicine to help you relax (sedative). A medicine to numb  the throat (local anesthetic). You will lie on your left side on an exam table. Your health care provider will pass the endoscope through your mouth and down your esophagus. Your health care provider will use the scope to check the inside of your esophagus, stomach, and duodenum. Biopsies may be taken. The endoscope will be removed. The procedure may vary among health care providers and hospitals. What happens after the procedure? Your blood pressure, heart rate, breathing rate, and blood oxygen level will be monitored until you leave the hospital or clinic. Do not drive for 24 hours if you were given a sedative during your procedure. When your throat is no longer numb, you may be given some fluids to drink. It is up to you to get the results of your procedure. Ask your health care provider, or the department that is doing the procedure, when your results will be ready. Summary Upper endoscopy is a procedure to look inside the upper GI tract. During the procedure, an IV will be inserted into one of your veins. You may be given a medicine to help you relax. A medicine will be used to numb your throat. The endoscope will be passed through your mouth and down your esophagus. This information is not intended to replace advice given to you by your health care provider. Make sure you discuss any questions you have with your health care provider. Document Revised: 03/11/2018 Document Reviewed: 02/16/2018 Elsevier Patient Education  2020 [employer].                                                                                                                                      EndoscopyCare After  Please read the instructions outlined below and refer to this sheet in the next few weeks. These discharge instructions provide you with general information on caring for yourself after you leave the hospital. Your doctor may also give you specific instructions. While your treatment has been planned  according to the most current medical practices available, unavoidable complications occasionally occur. If you have any problems or questions after discharge, please call your doctor. HOME CARE INSTRUCTIONS Activity You may resume your regular activity but move at a slower pace for the next 24 hours.  Take frequent rest periods for the next 24 hours.  Walking will help expel (get rid of) the air and reduce the bloated feeling in your abdomen.  No driving for 24 hours (because of the anesthesia (medicine) used during the test).  You may shower.  Do not sign any important legal documents or operate any machinery for 24 hours (because of the anesthesia used during the test).  Nutrition Drink plenty of fluids.  You may resume your normal diet.  Begin with a light meal and progress to your normal diet.  Avoid alcoholic beverages for 24 hours or as instructed by your caregiver.  Medications You may resume your normal medications unless your caregiver tells you otherwise. What you can expect today You may experience abdominal discomfort such as a feeling of fullness or "gas" pains.  You may experience a sore throat for 2 to 3 days. This is normal. Gargling with salt water may help this.  Follow-up Your doctor will discuss the results of your test with you. SEEK IMMEDIATE MEDICAL CARE IF: You have excessive nausea (feeling sick to your stomach) and/or vomiting.  You have severe abdominal pain and distention (swelling).  You have trouble swallowing.  You have a temperature over 100 F (37.8 C).  You have rectal bleeding or vomiting of blood.  Document Released: 04/30/2004 Document Revised: 09/05/2011 Document Reviewed: 11/11/2007

## 2023-09-19 ENCOUNTER — Encounter (HOSPITAL_COMMUNITY)
Admission: RE | Admit: 2023-09-19 | Discharge: 2023-09-19 | Disposition: A | Discharge status: Home / Self Care | Payer: Medicare PPO | Source: Ambulatory Visit | Attending: Gastroenterology | Admitting: Gastroenterology

## 2023-09-19 ENCOUNTER — Encounter (HOSPITAL_COMMUNITY): Payer: Self-pay

## 2023-09-19 VITALS — BP 140/86 | HR 98 | Temp 97.8°F | Resp 18 | Ht 62.0 in | Wt 200.0 lb

## 2023-09-19 DIAGNOSIS — E139 Other specified diabetes mellitus without complications: Secondary | ICD-10-CM | POA: Diagnosis not present

## 2023-09-19 DIAGNOSIS — Z01818 Encounter for other preprocedural examination: Secondary | ICD-10-CM | POA: Insufficient documentation

## 2023-09-19 DIAGNOSIS — D500 Iron deficiency anemia secondary to blood loss (chronic): Secondary | ICD-10-CM

## 2023-09-19 DIAGNOSIS — Z794 Long term (current) use of insulin: Secondary | ICD-10-CM | POA: Diagnosis not present

## 2023-09-19 DIAGNOSIS — Z862 Personal history of diseases of the blood and blood-forming organs and certain disorders involving the immune mechanism: Secondary | ICD-10-CM | POA: Diagnosis not present

## 2023-09-19 LAB — CBC WITH DIFFERENTIAL/PLATELET
Abs Immature Granulocytes: 0.01 10*3/uL (ref 0.00–0.07)
Basophils Absolute: 0 10*3/uL (ref 0.0–0.1)
Basophils Relative: 1 %
Eosinophils Absolute: 0.1 10*3/uL (ref 0.0–0.5)
Eosinophils Relative: 3 %
HCT: 32.5 % — ABNORMAL LOW (ref 36.0–46.0)
Hemoglobin: 9.6 g/dL — ABNORMAL LOW (ref 12.0–15.0)
Immature Granulocytes: 0 %
Lymphocytes Relative: 47 %
Lymphs Abs: 1.7 10*3/uL (ref 0.7–4.0)
MCH: 23.8 pg — ABNORMAL LOW (ref 26.0–34.0)
MCHC: 29.5 g/dL — ABNORMAL LOW (ref 30.0–36.0)
MCV: 80.4 fL (ref 80.0–100.0)
Monocytes Absolute: 0.4 10*3/uL (ref 0.1–1.0)
Monocytes Relative: 11 %
Neutro Abs: 1.4 10*3/uL — ABNORMAL LOW (ref 1.7–7.7)
Neutrophils Relative %: 38 %
Platelets: 113 10*3/uL — ABNORMAL LOW (ref 150–400)
RBC: 4.04 MIL/uL (ref 3.87–5.11)
RDW: 18.6 % — ABNORMAL HIGH (ref 11.5–15.5)
WBC: 3.6 10*3/uL — ABNORMAL LOW (ref 4.0–10.5)
nRBC: 0 % (ref 0.0–0.2)

## 2023-09-19 LAB — COMPREHENSIVE METABOLIC PANEL
ALT: 20 U/L (ref 0–44)
AST: 38 U/L (ref 15–41)
Albumin: 2.9 g/dL — ABNORMAL LOW (ref 3.5–5.0)
Alkaline Phosphatase: 63 U/L (ref 38–126)
Anion gap: 10 (ref 5–15)
BUN: 11 mg/dL (ref 8–23)
CO2: 25 mmol/L (ref 22–32)
Calcium: 8.7 mg/dL — ABNORMAL LOW (ref 8.9–10.3)
Chloride: 100 mmol/L (ref 98–111)
Creatinine, Ser: 0.66 mg/dL (ref 0.44–1.00)
GFR, Estimated: >60 mL/min (ref >60)
Glucose, Bld: 227 mg/dL — ABNORMAL HIGH (ref 70–99)
Potassium: 3.3 mmol/L — ABNORMAL LOW (ref 3.5–5.1)
Sodium: 135 mmol/L (ref 135–145)
Total Bilirubin: 1 mg/dL (ref <1.2)
Total Protein: 6.8 g/dL (ref 6.5–8.1)

## 2023-09-25 ENCOUNTER — Ambulatory Visit (HOSPITAL_COMMUNITY)
Admission: RE | Admit: 2023-09-25 | Discharge: 2023-09-25 | Disposition: A | Discharge status: Home / Self Care | Payer: Medicare PPO | Source: Ambulatory Visit | Attending: Gastroenterology | Admitting: Gastroenterology

## 2023-09-25 ENCOUNTER — Encounter (HOSPITAL_COMMUNITY): Payer: Self-pay | Admitting: Gastroenterology

## 2023-09-25 ENCOUNTER — Other Ambulatory Visit: Payer: Self-pay

## 2023-09-25 ENCOUNTER — Ambulatory Visit (HOSPITAL_COMMUNITY): Payer: Medicare PPO | Admitting: Certified Registered"

## 2023-09-25 ENCOUNTER — Encounter (HOSPITAL_COMMUNITY)
Admission: RE | Disposition: A | Discharge status: Home / Self Care | Payer: Self-pay | Source: Ambulatory Visit | Attending: Gastroenterology

## 2023-09-25 DIAGNOSIS — I10 Essential (primary) hypertension: Secondary | ICD-10-CM | POA: Diagnosis not present

## 2023-09-25 DIAGNOSIS — Z7985 Long-term (current) use of injectable non-insulin antidiabetic drugs: Secondary | ICD-10-CM | POA: Insufficient documentation

## 2023-09-25 DIAGNOSIS — E1140 Type 2 diabetes mellitus with diabetic neuropathy, unspecified: Secondary | ICD-10-CM | POA: Diagnosis not present

## 2023-09-25 DIAGNOSIS — K766 Portal hypertension: Secondary | ICD-10-CM | POA: Insufficient documentation

## 2023-09-25 DIAGNOSIS — D509 Iron deficiency anemia, unspecified: Secondary | ICD-10-CM | POA: Insufficient documentation

## 2023-09-25 DIAGNOSIS — K259 Gastric ulcer, unspecified as acute or chronic, without hemorrhage or perforation: Secondary | ICD-10-CM

## 2023-09-25 DIAGNOSIS — Z794 Long term (current) use of insulin: Secondary | ICD-10-CM | POA: Diagnosis not present

## 2023-09-25 DIAGNOSIS — G4733 Obstructive sleep apnea (adult) (pediatric): Secondary | ICD-10-CM | POA: Diagnosis not present

## 2023-09-25 DIAGNOSIS — Z7984 Long term (current) use of oral hypoglycemic drugs: Secondary | ICD-10-CM | POA: Diagnosis not present

## 2023-09-25 DIAGNOSIS — K2950 Unspecified chronic gastritis without bleeding: Secondary | ICD-10-CM | POA: Insufficient documentation

## 2023-09-25 DIAGNOSIS — K449 Diaphragmatic hernia without obstruction or gangrene: Secondary | ICD-10-CM | POA: Diagnosis not present

## 2023-09-25 DIAGNOSIS — K254 Chronic or unspecified gastric ulcer with hemorrhage: Secondary | ICD-10-CM | POA: Diagnosis not present

## 2023-09-25 DIAGNOSIS — K7581 Nonalcoholic steatohepatitis (NASH): Secondary | ICD-10-CM | POA: Insufficient documentation

## 2023-09-25 DIAGNOSIS — Z79620 Long term (current) use of immunosuppressive biologic: Secondary | ICD-10-CM | POA: Insufficient documentation

## 2023-09-25 DIAGNOSIS — D500 Iron deficiency anemia secondary to blood loss (chronic): Secondary | ICD-10-CM

## 2023-09-25 DIAGNOSIS — E785 Hyperlipidemia, unspecified: Secondary | ICD-10-CM | POA: Diagnosis not present

## 2023-09-25 DIAGNOSIS — K51911 Ulcerative colitis, unspecified with rectal bleeding: Secondary | ICD-10-CM | POA: Insufficient documentation

## 2023-09-25 DIAGNOSIS — F419 Anxiety disorder, unspecified: Secondary | ICD-10-CM | POA: Insufficient documentation

## 2023-09-25 DIAGNOSIS — I4891 Unspecified atrial fibrillation: Secondary | ICD-10-CM | POA: Diagnosis not present

## 2023-09-25 DIAGNOSIS — G4730 Sleep apnea, unspecified: Secondary | ICD-10-CM | POA: Diagnosis not present

## 2023-09-25 DIAGNOSIS — K7460 Unspecified cirrhosis of liver: Secondary | ICD-10-CM | POA: Insufficient documentation

## 2023-09-25 DIAGNOSIS — M797 Fibromyalgia: Secondary | ICD-10-CM | POA: Insufficient documentation

## 2023-09-25 DIAGNOSIS — K219 Gastro-esophageal reflux disease without esophagitis: Secondary | ICD-10-CM | POA: Insufficient documentation

## 2023-09-25 HISTORY — PX: ESOPHAGOGASTRODUODENOSCOPY (EGD) WITH PROPOFOL: SHX5813

## 2023-09-25 HISTORY — PX: HOT HEMOSTASIS: SHX5433

## 2023-09-25 HISTORY — PX: BIOPSY: SHX5522

## 2023-09-25 LAB — GLUCOSE, CAPILLARY: Glucose-Capillary: 254 mg/dL — ABNORMAL HIGH (ref 70–99)

## 2023-09-25 SURGERY — ESOPHAGOGASTRODUODENOSCOPY (EGD) WITH PROPOFOL
Anesthesia: General

## 2023-09-25 MED ORDER — ESOMEPRAZOLE MAGNESIUM 40 MG PO CPDR: 40.0000 mg | DELAYED_RELEASE_CAPSULE | Freq: Every day | ORAL | 3 refills | Status: DC

## 2023-09-25 MED ORDER — LIDOCAINE HCL (CARDIAC) PF 100 MG/5ML IV SOSY
PREFILLED_SYRINGE | INTRAVENOUS | Status: DC | PRN
  Administered 2023-09-25: 100 mg via INTRATRACHEAL

## 2023-09-25 MED ORDER — FERROUS SULFATE 325 (65 FE) MG PO TABS: 325.0000 mg | ORAL_TABLET | Freq: Every day | ORAL | 3 refills | Status: DC

## 2023-09-25 MED ORDER — LACTATED RINGERS IV SOLN: INTRAVENOUS | Status: DC | PRN

## 2023-09-25 MED ORDER — PROPOFOL 10 MG/ML IV BOLUS
INTRAVENOUS | Status: DC | PRN
  Administered 2023-09-25 (×2): 20 mg via INTRAVENOUS
  Administered 2023-09-25: 50 mg via INTRAVENOUS
  Administered 2023-09-25 (×3): 20 mg via INTRAVENOUS

## 2023-09-25 NOTE — Anesthesia Preprocedure Evaluation (Signed)
Anesthesia Evaluation  Patient identified by MRN, date of birth, ID band Patient awake    Reviewed: Allergy & Precautions, NPO status , Patient's Chart, lab work & pertinent test results  History of Anesthesia Complications (+) PONV and history of anesthetic complications  Airway Mallampati: II  TM Distance: >3 FB Neck ROM: Full    Dental no notable dental hx. (+) Dental Advisory Given, Teeth Intact   Pulmonary sleep apnea and Continuous Positive Airway Pressure Ventilation    Pulmonary exam normal breath sounds clear to auscultation       Cardiovascular Exercise Tolerance: Good hypertension, Pt. on medications Normal cardiovascular exam+ dysrhythmias Atrial Fibrillation  Rhythm:Regular Rate:Normal     Neuro/Psych  PSYCHIATRIC DISORDERS Anxiety      Neuromuscular disease    GI/Hepatic hiatal hernia, PUD,GERD  Medicated,,(+) Cirrhosis       , Hepatitis -  Endo/Other  diabetes, Well Controlled, Type 2, Oral Hypoglycemic Agents, Insulin Dependent    Renal/GU      Musculoskeletal  (+) Arthritis ,  Fibromyalgia -  Abdominal   Peds  Hematology  (+) Blood dyscrasia, anemia   Anesthesia Other Findings   Reproductive/Obstetrics                             Anesthesia Physical Anesthesia Plan  ASA: 3  Anesthesia Plan: General   Post-op Pain Management: Minimal or no pain anticipated   Induction:   PONV Risk Score and Plan: TIVA  Airway Management Planned: Nasal Cannula and Natural Airway  Additional Equipment:   Intra-op Plan:   Post-operative Plan:   Informed Consent: I have reviewed the patients History and Physical, chart, labs and discussed the procedure including the risks, benefits and alternatives for the proposed anesthesia with the patient or authorized representative who has indicated his/her understanding and acceptance.     Dental advisory given  Plan Discussed  with: CRNA and Surgeon  Anesthesia Plan Comments:         Anesthesia Quick Evaluation

## 2023-09-25 NOTE — H&P (Addendum)
[name] is an 77 y.o. female.   Chief Complaint: Iron deficiency anemia HPI: [name] is a 77 y.o. female with past medical history of ulcerative colitis on infliximab, NASH cirrhosis, atrial fibrillation status post watchman placement, GERD, anxiety, hyperlipidemia, history of iron deficiency anemia, diabetes, neuropathy, OSA, IBS, fibromyalgia, who presents for iron deficiency anemia.  Patient reports feeling well.  Only states taking Imodium occasionally for diarrhea episodes.  No nausea, vomiting, fever or chills.  No melena or hematochezia.  Currently taking oral iron on a daily basis.  States she has not started taking any iron.  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 ? to get liver bx if she gets bariatric surgery? Mild portal hypertensive gastropathy on EGD 08/2019.   Fibromyalgia    GAD (generalized anxiety disorder)    GERD    History of hiatal hernia    History of iron deficiency anemia 12/2018   Inadequate absorption secondary to chronic/long term PPI therapy + portal hypertensive gastroduodenopathy. No GIB found on EGD, colonosc, and givens. Iron infusions X multiple.   History of thrombocytopenia 12/2011   Hyperlipidemia    Intolerant of statins   HYPERTENSION    IBS (irritable bowel syndrome)    -D.  Good response to bentyl and imodium as of 06/2018 GI f/u.   IDDM (insulin dependent diabetes mellitus)    with DPN (managed by Dr. Elvera Lennox but then in 2018 pt preferred to have me manage for her convenience)   Limited mobility    Requires a walker for arthritic pain, widespread musculoskeletal pain, and neuropathic pain.   Morbid obesity (HCC)    As of 11/2018, pt considering sleeve gastectomy vs bipass as of eval by Dr. Alvino Blood considering as of 12/2019.   Nonalcoholic steatohepatitis    Viral Hep  screens NEG.  CT 2015.  Transaminasemia.  U/S 07/2018 showed early changes of cirrhosis.   OSA (obstructive sleep apnea) 09/14/2015   sleep study 09/07/15: severe obstructive sleep apnea with an AHI of 72 and SaO2 low of 75%.>referred to sleep MD   Osteoarthritis    hips, shoulders, knees   PAF (paroxysmal atrial fibrillation) (HCC)    One documented episode (after getting EGD 2016).  Was on amiodarone x 3 mo.  Rate control with metoprolol + anticoag with xarelto. Watchman 12/2022, off anticoag   PONV (postoperative nausea and vomiting)    Recurrent epistaxis    Granuloma in L nare cauterized by ENT 04/2020. Another cautery 06/2020   Small fiber neuropathy    Due to DM.  Symmetric hands and feet tingling/numbness.   Ulcerative colitis (HCC)    Remicade infusion Q 8 weeks: in clinical and endoscopic remission as of 12/2018 GI f/u.  06/11/19 rpt colonoscopy->cecal and ascending colon colitis.    Past Surgical History:  Procedure Laterality Date   ABDOMINAL HYSTERECTOMY  1980   Paps no longer indicated.   BACK SURGERY     BACTERIAL OVERGROWTH TEST N/A 07/13/2015   Procedure: BACTERIAL OVERGROWTH TEST;  Surgeon: Malissa Hippo, MD;  [facility];  Service: Endoscopy;  Laterality: N/A;  730     BILATERAL SALPINGOOPHORECTOMY  02/10/2001   BIOPSY  06/11/2019   Procedure: BIOPSY;  Surgeon: Malissa Hippo, MD;  [facility]: [facility];  Service: Endoscopy;;  colon   BIOPSY  09/27/2019  Procedure: BIOPSY;  Surgeon: Malissa Hippo, MD;  [facility]: [facility];  Service: Endoscopy;;  gastric duodenal   BIOPSY  09/15/2020   Procedure: BIOPSY;  Surgeon: Dolores Frame, MD;  [facility]: [facility];  Service: Gastroenterology;;   BREAST REDUCTION SURGERY  1994   bilat   BREAST SURGERY     CARDIOVASCULAR STRESS TEST  07/2010   Lexiscan myoview: normal   CARPAL TUNNEL RELEASE Right 1996   CARPAL TUNNEL RELEASE Left 03/21/2003   CARPAL TUNNEL RELEASE Right 05/04/2013    Procedure: CARPAL TUNNEL RELEASE;  Surgeon: Wyn Forster., MD;  Location: [facility];  Service: Orthopedics;  Laterality: Right;   CARPAL TUNNEL RELEASE Left 09/21/2013   Procedure: LEFT CARPAL TUNNEL RELEASE;  Surgeon: Wyn Forster., MD;  Location: [facility];  Service: Orthopedics;  Laterality: Left;   CHOLECYSTECTOMY     COLONOSCOPY WITH PROPOFOL N/A 08/04/2015   Colitis in remission.  No polyps.  Procedure: COLONOSCOPY WITH PROPOFOL;  Surgeon: Malissa Hippo, MD;  Location: [facility];  Service: Endoscopy;  Laterality: N/A;  cecum time in  0820   time out  0827    total time 7 minutes   COLONOSCOPY WITH PROPOFOL N/A 06/11/2019   cecal and ascending colon colitis.  Procedure: COLONOSCOPY WITH PROPOFOL;  Surgeon: Malissa Hippo, MD;  [facility];  Service: Endoscopy;  Laterality: N/A;  730a   COLONOSCOPY WITH PROPOFOL N/A 09/15/2020   Procedure: COLONOSCOPY WITH PROPOFOL;  Surgeon: Dolores Frame, MD;  [facility];  Service: Gastroenterology;  Laterality: N/A;  1030   ESOPHAGEAL DILATION N/A 08/04/2015   Procedure: ESOPHAGEAL DILATION;  Surgeon: Malissa Hippo, MD;  Location: [facility];  Service: Endoscopy;  Laterality: N/ALauralee Evener, no mucousal disruption   ESOPHAGOGASTRODUODENOSCOPY  09/27/2019   Performed for IDA.  Esoph dilation was done but no stricture present.  Mild portal hypertensive gastropathy, o/w normal.  Duodenal bx NEG.  h pylori neg.   ESOPHAGOGASTRODUODENOSCOPY (EGD) WITH ESOPHAGEAL DILATION  12/02/2005   ESOPHAGOGASTRODUODENOSCOPY (EGD) WITH PROPOFOL N/A 08/04/2015   Procedure: ESOPHAGOGASTRODUODENOSCOPY (EGD) WITH PROPOFOL;  Surgeon: Malissa Hippo, MD;  Location: [facility];  Service: Endoscopy;  Laterality: N/A;  procedure 1   ESOPHAGOGASTRODUODENOSCOPY (EGD) WITH PROPOFOL N/A 09/27/2019   Procedure: ESOPHAGOGASTRODUODENOSCOPY (EGD) WITH PROPOFOL;  Surgeon: Malissa Hippo, MD;  [facility];  Service: Endoscopy;  Laterality: N/A;  12:10   ESOPHAGOGASTRODUODENOSCOPY (EGD) WITH PROPOFOL N/A 10/31/2021   Procedure: ESOPHAGOGASTRODUODENOSCOPY (EGD) WITH PROPOFOL;  Surgeon: Malissa Hippo, MD;  [facility];  Service: Endoscopy;  Laterality: N/A;  930   FLEXIBLE SIGMOIDOSCOPY  01/17/2012   Procedure: FLEXIBLE SIGMOIDOSCOPY;  Surgeon: Malissa Hippo, MD;  [facility];  Service: Endoscopy;  Laterality: N/A;   GIVENS CAPSULE STUDY N/A 08/03/2019   Procedure: GIVENS CAPSULE STUDY (performed for IDA)->some food debris in stomach and small amount of blood.  Surgeon: Malissa Hippo, MD;  [facility];  Service: Endoscopy;  Laterality: N/A;  730AM   HEMILAMINOTOMY LUMBAR SPINE Bilateral 09/07/1999   L4-5   KNEE ARTHROSCOPY Right 01/1999; 10/2000   LEFT ATRIAL APPENDAGE OCCLUSION N/A 01/23/2023   Procedure: LEFT ATRIAL APPENDAGE OCCLUSION;  Surgeon: [name], MD;  Location: [facility] INVASIVE CV LAB;  Service: Cardiovascular;  Laterality: N/A;   LEFT ATRIAL APPENDAGE OCCLUSION     LYSIS OF ADHESION  02/10/2001   MALONEY DILATION  09/27/2019   Procedure: Elease Hashimoto DILATION;  Surgeon: Malissa Hippo, MD;  [facility];  Service: Endoscopy;;   NASAL ENDOSCOPY WITH EPISTAXIS CONTROL Bilateral 01/25/2022   Procedure: NASAL ENDOSCOPY WITH EPISTAXIS CONTROL;  Surgeon: Osborn Coho, MD;  Location: [facility];  Service: ENT;  Laterality: Bilateral;   NASAL SEPTOPLASTY W/ TURBINOPLASTY Bilateral 01/25/2022   Procedure: NASAL SEPTOPLASTY WITH TURBINATE REDUCTION;  Surgeon: Osborn Coho, MD;  Location: [facility];  Service: ENT;  Laterality: Bilateral;   RECTOCELE REPAIR  1990; 09/12/2006   TARSAL TUNNEL RELEASE  2002   TEE WITHOUT CARDIOVERSION N/A 01/23/2023   Procedure: TRANSESOPHAGEAL ECHOCARDIOGRAM;  Surgeon: [name], MD;  Location: [facility] INVASIVE CV LAB;  Service: Cardiovascular;  Laterality: N/A;   TRANSESOPHAGEAL ECHOCARDIOGRAM (CATH LAB)  N/A 08/26/2023   Procedure: TRANSESOPHAGEAL ECHOCARDIOGRAM;  Surgeon: [name], MD;  Location: [facility]VAS[facility] CV LAB;  Service: Cardiovascular;  Laterality: N/A;   TRANSTHORACIC ECHOCARDIOGRAM  08/04/2015   EF 60-65%, normal wall motion, mild LVH, mild LA dilation, grd I DD.   TUMOR EXCISION Left 03/21/2003   dorsal 1st web space (hand)   URETEROLYSIS Right 02/10/2001    Family History  Problem Relation Age of Onset   Diabetes Mother    Hypertension Mother    Heart attack Father        Mid 8's   Heart disease Father    Lung disease Father        spot on lung; had lung surgery   Alcohol abuse Other    Hypertension Son    Diabetes Son    Emphysema Maternal Grandfather    Asthma Maternal Grandfather    Colon cancer Paternal Grandfather    Stomach cancer Paternal Grandmother    Neuropathy Neg Hx    Social History:  reports that she has never smoked. She has never used smokeless tobacco. She reports that she does not currently use alcohol. She reports that she does not use drugs.  Allergies:  Allergies  Allergen Reactions   Flagyl [Metronidazole Hcl] Other (See Comments)    DIAPHORESIS   Omeprazole Anaphylaxis and Swelling    SWELLING OF TONGUE AND THROAT   Actos [Pioglitazone] Swelling and Other (See Comments)    Weight gain, tongue swelling    Benzocaine-Menthol Swelling    SWELLING OF MOUTH   Metformin And Related Diarrhea   Shrimp [Shellfish Allergy] Itching    OF THROAT AND EARS, if consumed raw   Statins Palpitations   Welchol [Colesevelam] Other (See Comments)    GI UPSET   Allevyn Adhesive [Wound Dressings]     Other reaction(s): Unknown   Desipramine Hcl Itching, Nausea Only and Other (See Comments)    "swimmy" headed, ears itched    Hydromorphone Itching   Jardiance [Empagliflozin] Other (See Comments)    weakness   Lactose Intolerance (Gi) Diarrhea    Gas, bloating   Adhesive [Tape] Other (See Comments)    SKIN IRRITATION AND BRUISING    Nisoldipine Itching   Percocet [Oxycodone-Acetaminophen] Itching    Medications Prior to Admission  Medication Sig Dispense Refill   Accu-Chek Softclix Lancets lancets USE TO CHECK BLOOD GLUCOSE UP TO 6 TIMES DAILY AS DIRECTED 200 each 5   acetaminophen (TYLENOL) 325 MG tablet Take 650 mg by mouth every 6 (six) hours as needed for moderate pain or headache.     aspirin EC 81 MG tablet Take 1 tablet (81 mg total) by mouth daily. Swallow whole.     blood glucose  meter kit and supplies KIT Use up to six times daily as directed. DX. E11.9 1 each 0   canagliflozin (INVOKANA) 100 MG TABS tablet Take 1 tablet (100 mg total) by mouth daily before breakfast. 90 tablet 1   citalopram (CELEXA) 20 MG tablet Take 1 tablet (20 mg total) by mouth daily. 90 tablet 1   dicyclomine (BENTYL) 10 MG capsule TAKE 1 CAPSULE (10 MG TOTAL) BY MOUTH EVERY 8 (EIGHT) HOURS AS NEEDED FOR SPASMS (ABDOMINAL PAIN). 270 capsule 1   diphenoxylate-atropine (LOMOTIL) 2.5-0.025 MG tablet Take 1 tablet by mouth 4 (four) times daily as needed for diarrhea or loose stools. 60 tablet 2   Dulaglutide (TRULICITY) 1.5 MG/0.5ML SOPN INJECT 1.5MG  ONCE WEEKLY 18 mL 3   esomeprazole (NEXIUM) 20 MG capsule TAKE 1 CAPSULE BY MOUTH DAILY BEFORE BREAKFAST 90 capsule 3   ferrous sulfate 325 (65 FE) MG tablet Take 1 tablet (325 mg total) by mouth daily with breakfast. 90 tablet 3   furosemide (LASIX) 40 MG tablet Take 1 tablet (40 mg total) by mouth daily. 90 tablet 1   gabapentin (NEURONTIN) 600 MG tablet TAKE 1 AND 1/2 TABLETS BY MOUTH TWICE A DAY. MUST KEEP APPT FOR FURTHER REFILLS 90 tablet 0   glucose blood (ACCU-CHEK GUIDE) test strip USE TO CHECK BLOOD GLUCOSE UP TO 6 TIMES DAILY AS DIRECTED 600 strip 3   INFLIXIMAB IV Inject into the vein every 8 (eight) weeks. Remicaid 5mg /kg Infusion every 8 weeks [facility])     Insulin Pen Needle (B-D UF III MINI PEN NEEDLES) 31G X 5 MM MISC USE TO INJECT INSULINS EQUAL TO 6 TIMES DAILY.  500 each 6   insulin regular human CONCENTRATED (HUMULIN R U-500 KWIKPEN) 500 UNIT/ML KwikPen Inject 100 units into the skin at breakfast and lunch. 48 mL 1   lactase (LACTAID) 3000 UNITS tablet Take 3,000 Units by mouth as needed (when eating foods containing dairy).      meclizine (ANTIVERT) 25 MG tablet Take 1 tablet (25 mg total) by mouth 3 (three) times daily as needed for dizziness or nausea. 90 tablet 0   methocarbamol (ROBAXIN) 500 MG tablet TAKE 1 TABLET BY MOUTH EVERY 8 HOURS AS NEEDED FOR MUSCLE SPASMS. 20 tablet 0   metoprolol succinate (TOPROL-XL) 100 MG 24 hr tablet TAKE 1 TABLET BY MOUTH 2 TIMES DAILY. TAKE WITH OR IMMEDIATELY FOLLOWING A MEAL 180 tablet 1   oxybutynin (DITROPAN XL) 5 MG 24 hr tablet Take 1 tablet (5 mg total) by mouth at bedtime. 30 tablet 0   potassium chloride (KLOR-CON M10) 10 MEQ tablet Take 1 tablet (10 mEq total) by mouth daily. 90 tablet 1    No results found. However, due to the size of the patient record, not all encounters were searched. Please check Results Review for a complete set of results. No results found.  Review of Systems  All other systems reviewed and are negative.   Blood pressure (!) 140/80, pulse (!) 112, temperature 98.2 F (36.8 C), temperature source Oral, resp. rate 19, height 5\' 2"  (1.575 m), weight 90.7 kg, SpO2 97%. Physical Exam  GENERAL: The patient is AO x3, in no acute distress. HEENT: Head is normocephalic and atraumatic. EOMI are intact. Mouth is well hydrated and without lesions. NECK: Supple. No masses LUNGS: Clear to auscultation. No presence of rhonchi/wheezing/rales. Adequate chest expansion HEART: RRR, normal s1 and s2. ABDOMEN: Soft, nontender, no guarding, no peritoneal signs, and nondistended. BS +. No masses. EXTREMITIES: Without  any cyanosis, clubbing, rash, lesions or edema. NEUROLOGIC: AOx3, no focal motor deficit. SKIN: no jaundice, no rashes  Assessment/Plan [name] is a 76 y.o. female with past  medical history of ulcerative colitis on infliximab, NASH cirrhosis, atrial fibrillation status post watchman placement, GERD, anxiety, hyperlipidemia, history of iron deficiency anemia, diabetes, neuropathy, OSA, IBS, fibromyalgia, who presents for iron deficiency anemia.  Will proceed with EGD.  Dolores Frame, MD 09/25/2023, 8:30 AM

## 2023-09-25 NOTE — Anesthesia Postprocedure Evaluation (Signed)
Anesthesia Post Note  Patient: ETHYL GAIN  Procedure(s) Performed: ESOPHAGOGASTRODUODENOSCOPY (EGD) WITH PROPOFOL BIOPSY HOT HEMOSTASIS (ARGON PLASMA COAGULATION/BICAP)  Patient location during evaluation: PACU Anesthesia Type: General Level of consciousness: awake and alert Pain management: pain level controlled Vital Signs Assessment: post-procedure vital signs reviewed and stable Respiratory status: spontaneous breathing, nonlabored ventilation, respiratory function stable and patient connected to nasal cannula oxygen Cardiovascular status: blood pressure returned to baseline and stable Postop Assessment: no apparent nausea or vomiting Anesthetic complications: no   There were no known notable events for this encounter.   Last Vitals:  Vitals:   09/25/23 0825 09/25/23 0905  BP: (!) 140/80 127/72  Pulse: (!) 112 (!) 117  Resp: 19 14  Temp: 36.8 C 36.6 C  SpO2: 97% 98%    Last Pain:  Vitals:   09/25/23 0905  TempSrc: Oral  PainSc: 0-No pain                 Elsi Stelzer L Janoah Menna

## 2023-09-25 NOTE — Discharge Instructions (Signed)
You are being discharged to home.  Resume your previous diet.  We are waiting for your pathology results.  Increase Nexium (esomeprazole) 40 mg by mouth once a day.  Your physician has recommended a repeat upper endoscopy in three months for surveillance.  Start ferrous sulfate 325 mg qday.

## 2023-09-25 NOTE — Op Note (Signed)
[facility] Patient Name: [name] Procedure Date: 09/25/2023 8:28 AM MRN: [id number] Date of Birth: 12/10/1945 Attending MD: Katrinka Blazing , , [id number] CSN: [id number] Age: 77 Admit Type: Outpatient Procedure:                Upper GI endoscopy Indications:              Iron deficiency anemia Providers:                Katrinka Blazing, Crystal Page, Zena Amos Referring MD:              Medicines:                Monitored Anesthesia Care Complications:            No immediate complications. Estimated Blood Loss:     Estimated blood loss: none. Procedure:                Pre-Anesthesia Assessment:                           - Prior to the procedure, a History and Physical                            was performed, and patient medications, allergies                            and sensitivities were reviewed. The patient's                            tolerance of previous anesthesia was reviewed.                           - The risks and benefits of the procedure and the                            sedation options and risks were discussed with the                            patient. All questions were answered and informed                            consent was obtained.                           - ASA Grade Assessment: III - A patient with severe                            systemic disease.                           After obtaining informed consent, the endoscope was                            passed under direct vision. Throughout the                            procedure, the patient's blood pressure,  pulse, and                            oxygen saturations were monitored continuously. The                            GIF-H190 ([id number]) scope was introduced through the                            mouth, and advanced to the second part of duodenum.                            The upper GI endoscopy was accomplished without                            difficulty. The patient  tolerated the procedure                            well. Scope In: 8:50:16 AM Scope Out: 8:59:06 AM Total Procedure Duration: 0 hours 8 minutes 50 seconds  Findings:      A 1 cm hiatal hernia was present.      Localized hemorrhagic mucosa with bleeding was found in the gastric       antrum, likely due to portal hypertensive gastropathy. Coagulation for       hemostasis using argon plasma at 0.3 liters/minute and 20 watts was       successful.      Two non-bleeding superficial gastric ulcers with a clean ulcer base       (Forrest Class III) were found at the pylorus. The largest lesion was 5       mm in largest dimension. Biopsies were taken with a cold forceps for       Helicobacter pylori testing.      The examined duodenum was normal. Impression:               - 1 cm hiatal hernia.                           - Hemorrhagic gastropathy. Treated with argon                            plasma coagulation (APC).                           - Non-bleeding gastric ulcers with a clean ulcer                            base (Forrest Class III). Biopsied.                           - Normal examined duodenum. Moderate Sedation:      Per Anesthesia Care Recommendation:           - Discharge patient to home (ambulatory).                           - Resume previous diet.                           -  Await pathology results.                           - Increase Nexium (esomeprazole) to 40 mg PO daily.                           - Repeat upper endoscopy in 3 months for                            surveillance.                           - Start ferrous sulfate 325 mg qday. Procedure Code(s):        --- Professional ---                           [phone], 59, Esophagogastroduodenoscopy, flexible,                            transoral; with control of bleeding, any method                           43239, Esophagogastroduodenoscopy, flexible,                            transoral; with biopsy, single or  multiple Diagnosis Code(s):        --- Professional ---                           K44.9, Diaphragmatic hernia without obstruction or                            gangrene                           K92.2, Gastrointestinal hemorrhage, unspecified                           K25.9, Gastric ulcer, unspecified as acute or                            chronic, without hemorrhage or perforation                           D50.9, Iron deficiency anemia, unspecified CPT copyright 2022 [employer]. All rights reserved. The codes documented in this report are preliminary and upon coder review may  be revised to meet current compliance requirements. Katrinka Blazing, MD Katrinka Blazing,  09/25/2023 9:07:26 AM This report has been signed electronically. Number of Addenda: 0

## 2023-09-25 NOTE — Transfer of Care (Signed)
Immediate Anesthesia Transfer of Care Note  Patient: [name]  Procedure(s) Performed: ESOPHAGOGASTRODUODENOSCOPY (EGD) WITH PROPOFOL BIOPSY HOT HEMOSTASIS (ARGON PLASMA COAGULATION/BICAP)  Patient Location: PACU  Anesthesia Type:General  Level of Consciousness: awake, alert , and oriented  Airway & Oxygen Therapy: Patient Spontanous Breathing and Patient connected to nasal cannula oxygen  Post-op Assessment: Report given to RN and Post -op Vital signs reviewed and stable  Post vital signs: Reviewed and stable  Last Vitals:  Vitals Value Taken Time  BP 127/72 09/25/23 0905  Temp 36.6 C 09/25/23 0905  Pulse 117 09/25/23 0905  Resp 14 09/25/23 0905  SpO2 98 % 09/25/23 0905    Last Pain:  Vitals:   09/25/23 0905  TempSrc: Oral  PainSc: 0-No pain      Patients Stated Pain Goal: 5 (09/25/23 0825)  Complications: No notable events documented.

## 2023-09-26 LAB — SURGICAL PATHOLOGY

## 2023-09-28 ENCOUNTER — Encounter: Payer: Self-pay | Admitting: Family Medicine

## 2023-09-29 ENCOUNTER — Telehealth: Payer: Self-pay

## 2023-09-29 ENCOUNTER — Telehealth (INDEPENDENT_AMBULATORY_CARE_PROVIDER_SITE_OTHER): Payer: Self-pay | Admitting: Gastroenterology

## 2023-09-29 NOTE — Telephone Encounter (Signed)
Christell Constant, Mitzie Rushie Nyhan, LPN; Luster Landsberg H      Previous Messages    ----- Message ----- From: Pietro Cassis, RN Sent: 09/25/2023   9:12 AM EST To: Gaylene Brooks Subject: f/u                                            Repeat endo in 3 months

## 2023-09-29 NOTE — Telephone Encounter (Signed)
Placed in pcp office.

## 2023-09-29 NOTE — Telephone Encounter (Signed)
Type of forms received:  [employer]  Routed to: [employer] received by :  Annabelle Harman   Individual made aware of 5-7 business day turn around (Y/N):  Y  Form completed and patient made aware of charges(Y/N):  Y   Form location:   Team [place] inbox front office   Please fax/mail completed forms to [facility]

## 2023-09-30 ENCOUNTER — Ambulatory Visit: Payer: Medicare PPO | Admitting: Family Medicine

## 2023-09-30 ENCOUNTER — Encounter (INDEPENDENT_AMBULATORY_CARE_PROVIDER_SITE_OTHER): Payer: Self-pay | Admitting: *Deleted

## 2023-09-30 ENCOUNTER — Encounter: Payer: Self-pay | Admitting: Family Medicine

## 2023-09-30 ENCOUNTER — Telehealth: Payer: Self-pay | Admitting: Family Medicine

## 2023-09-30 VITALS — BP 169/80 | HR 57 | Wt 218.4 lb

## 2023-09-30 DIAGNOSIS — K5190 Ulcerative colitis, unspecified, without complications: Secondary | ICD-10-CM | POA: Diagnosis not present

## 2023-09-30 DIAGNOSIS — M48062 Spinal stenosis, lumbar region with neurogenic claudication: Secondary | ICD-10-CM | POA: Diagnosis not present

## 2023-09-30 DIAGNOSIS — M533 Sacrococcygeal disorders, not elsewhere classified: Secondary | ICD-10-CM | POA: Diagnosis not present

## 2023-09-30 DIAGNOSIS — M461 Sacroiliitis, not elsewhere classified: Secondary | ICD-10-CM

## 2023-09-30 MED ORDER — HYDROCODONE-ACETAMINOPHEN 5-325 MG PO TABS: 1.0000 | ORAL_TABLET | Freq: Four times a day (QID) | ORAL | 0 refills | Status: DC | PRN

## 2023-09-30 MED ORDER — GABAPENTIN 600 MG PO TABS: ORAL_TABLET | ORAL | 1 refills | Status: DC

## 2023-09-30 MED ORDER — HYDROCODONE-ACETAMINOPHEN 5-325 MG PO TABS: ORAL_TABLET | ORAL | 0 refills | Status: DC

## 2023-09-30 NOTE — Telephone Encounter (Signed)
 Patient did not request while in with the provider but she is asking for a refill on meclizine (ANTIVERT) 25 MG tablet. Sent to the [facility] in [facility]

## 2023-09-30 NOTE — Telephone Encounter (Signed)
Not prescribed by pcp

## 2023-09-30 NOTE — Progress Notes (Signed)
 OFFICE VISIT  09/30/2023  CC:  Chief Complaint  Patient presents with   Hip Pain    A few weeks, sharp pain; Pt states she went to urgent care a few weeks ago for UTI and was given a shot; pt is unsure of what was given. Ever since the injection was given pt has had lower back/hip pain.     Patient is a 77 y.o. female who presents for bilateral buttock pain.  HPI: Approximately 1 month ago she started to get pain in the sacroiliac/buttocks region bilaterally.  She states this started after getting an injection of Rocephin  for UTI when she went to urgent care. The pain was essentially constant at a mild level but if she stood up it would hurt pretty bad and would get worse with trying to walk anywhere.  She uses a walker currently, which was not needed prior to this pain.  No preceding trauma or overuse. She got another Rocephin  injection on 09/11/2023 and states this made her back feel even worse. Tramadol  and Robaxin  at home have not been helpful, nor has Tylenol .  She is on Remicade  for ulcerative colitis but due to recent urinary infections has gone over a month without this infusion.  She does have a history of lumbar spinal stenosis and a history of L5 compression fracture. 10/09/2022 MRI lumbar spine with and without contrast: MPRESSION: 1. Mild acute superior endplate compression fracture at L5. No osseous retropulsion. 2. No evidence of discitis or osteomyelitis. Periarticular enhancement associated with the L4-5 facet joints, left greater than right, favored to be degenerative/inflammatory rather than infectious. Correlate clinically. 3. Moderate multifactorial spinal stenosis at L4-5 and L5-S1 with mild to moderate foraminal narrowing bilaterally. 4. Mild multifactorial spinal stenosis and left foraminal narrowing at L3-4.  Review of systems: She has significant fatigue.  No fevers.  No saddle anesthesia.  No radiating pains into the legs or up into the low back.  No  rash.  No paresthesias.  No melena, hematochezia, or abdominal pain. No other joints are bothering her much.  No shortness of breath, cough, or chest pain.  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 ? to get liver bx if she gets bariatric surgery? Mild portal hypertensive gastropathy on EGD 08/2019.   Fibromyalgia    GAD (generalized anxiety disorder)    GERD    History of hiatal hernia    History of iron  deficiency anemia 12/2018   Inadequate absorption secondary to chronic/long term PPI therapy + portal hypertensive gastroduodenopathy. No GIB found on EGD, colonosc, and givens. Iron  infusions X multiple.   History of thrombocytopenia 12/2011   Hyperlipidemia    Intolerant of statins   HYPERTENSION    IBS (irritable bowel syndrome)    -D.  Good response to bentyl  and imodium  as of 06/2018 GI f/u.   IDDM (insulin  dependent diabetes mellitus)    with DPN (managed by Dr. Trixie but then in 2018 pt preferred to have me manage for her convenience)   Limited mobility    Requires a walker for arthritic pain, widespread musculoskeletal pain, and neuropathic pain.   Morbid obesity (HCC)    As of 11/2018, pt considering sleeve gastectomy vs bipass as of eval by Dr. Gael considering as of 12/2019.   Nonalcoholic steatohepatitis    Viral Hep screens NEG.  CT 2015.  Transaminasemia.  U/S 07/2018 showed early changes of cirrhosis.   OSA (obstructive sleep apnea) 09/14/2015   sleep study 09/07/15: severe obstructive sleep apnea with an AHI of 72 and SaO2 low of 75%.>referred to sleep MD   Osteoarthritis    hips, shoulders, knees   PAF (paroxysmal atrial fibrillation) (HCC)    One documented episode (after getting EGD 2016).  Was on amiodarone  x 3 mo.  Rate control with metoprolol  + anticoag with xarelto . Watchman 12/2022, off anticoag    Portal hypertensive gastropathy (HCC)    hemorrhagic gastropathy + non-bleeding gastric ulcers on EGD 08/2023   Recurrent epistaxis    Granuloma in L nare cauterized by ENT 04/2020. Another cautery 06/2020   Small fiber neuropathy    Due to DM.  Symmetric hands and feet tingling/numbness.   Ulcerative colitis (HCC)    Remicade  infusion Q 8 weeks: in clinical and endoscopic remission as of 12/2018 GI f/u.  06/11/19 rpt colonoscopy->cecal and ascending colon colitis.    Past Surgical History:  Procedure Laterality Date   ABDOMINAL HYSTERECTOMY  1980   Paps no longer indicated.   BACK SURGERY     BACTERIAL OVERGROWTH TEST N/A 07/13/2015   Procedure: BACTERIAL OVERGROWTH TEST;  Surgeon: Claudis RAYMOND Rivet, MD;  [facility];  Service: Endoscopy;  Laterality: N/A;  730     BILATERAL SALPINGOOPHORECTOMY  02/10/2001   BIOPSY  06/11/2019   Procedure: BIOPSY;  Surgeon: Rivet Claudis RAYMOND, MD;  [facility];  Service: Endoscopy;;  colon   BIOPSY  09/27/2019   Procedure: BIOPSY;  Surgeon: Rivet Claudis RAYMOND, MD;  [facility];  Service: Endoscopy;;  gastric duodenal   BIOPSY  09/15/2020   Procedure: BIOPSY;  Surgeon: Eartha Angelia Sieving, MD;  [facility];  Service: Gastroenterology;;   BREAST REDUCTION SURGERY  1994   bilat   BREAST SURGERY     CARDIOVASCULAR STRESS TEST  07/2010   Lexiscan myoview: normal   CARPAL TUNNEL RELEASE Right 1996   CARPAL TUNNEL RELEASE Left 03/21/2003   CARPAL TUNNEL RELEASE Right 05/04/2013   Procedure: CARPAL TUNNEL RELEASE;  Surgeon: Lamar LULLA Leonor Mickey., MD;  Location: [facility];  Service: Orthopedics;  Laterality: Right;   CARPAL TUNNEL RELEASE Left 09/21/2013   Procedure: LEFT CARPAL TUNNEL RELEASE;  Surgeon: Lamar LULLA Leonor Mickey., MD;  Location: [facility];  Service: Orthopedics;  Laterality: Left;   CHOLECYSTECTOMY     COLONOSCOPY WITH PROPOFOL  N/A 08/04/2015   Colitis in remission.   No polyps.  Procedure: COLONOSCOPY WITH PROPOFOL ;  Surgeon: Claudis RAYMOND Rivet, MD;  [facility]: [facility];  Service: Endoscopy;  Laterality: N/A;  cecum time in  0820   time out  0827    total time 7 minutes   COLONOSCOPY WITH PROPOFOL  N/A 06/11/2019   cecal and ascending colon colitis.  Procedure: COLONOSCOPY WITH PROPOFOL ;  Surgeon: Rivet Claudis RAYMOND, MD;  [facility];  Service: Endoscopy;  Laterality: N/A;  730a   COLONOSCOPY WITH PROPOFOL  N/A 09/15/2020   Procedure: COLONOSCOPY WITH PROPOFOL ;  Surgeon: Eartha Angelia Sieving, MD;  [facility];  Service: Gastroenterology;  Laterality: N/A;  1030   ESOPHAGEAL DILATION N/A 08/04/2015   Procedure: ESOPHAGEAL DILATION;  Surgeon: Claudis RAYMOND Rivet, MD;  [facility]: [facility];  Service: Endoscopy;  Laterality: N/AMERL Agapito MOH, no mucousal disruption   ESOPHAGOGASTRODUODENOSCOPY  09/27/2019   Performed for IDA.  Esoph dilation was done but no stricture  present.  Mild portal hypertensive gastropathy, o/w normal.  Duodenal bx NEG.  h pylori neg.   ESOPHAGOGASTRODUODENOSCOPY     09/25/2023 inactive chronic gastritis, h pylor neg. benign   ESOPHAGOGASTRODUODENOSCOPY     09/25/23 hemorrhagic portal gastropathy + nonbleeding gastric ulcers (H pylori neg)   ESOPHAGOGASTRODUODENOSCOPY (EGD) WITH ESOPHAGEAL DILATION  12/02/2005   ESOPHAGOGASTRODUODENOSCOPY (EGD) WITH PROPOFOL  N/A 08/04/2015   Procedure: ESOPHAGOGASTRODUODENOSCOPY (EGD) WITH PROPOFOL ;  Surgeon: Claudis RAYMOND Rivet, MD;  [facility];  Service: Endoscopy;  Laterality: N/A;  procedure 1   ESOPHAGOGASTRODUODENOSCOPY (EGD) WITH PROPOFOL  N/A 09/27/2019   Procedure: ESOPHAGOGASTRODUODENOSCOPY (EGD) WITH PROPOFOL ;  Surgeon: Rivet Claudis RAYMOND, MD;  [facility];  Service: Endoscopy;  Laterality: N/A;  12:10   ESOPHAGOGASTRODUODENOSCOPY (EGD) WITH PROPOFOL  N/A 10/31/2021   Procedure: ESOPHAGOGASTRODUODENOSCOPY (EGD) WITH PROPOFOL ;  Surgeon: Rivet Claudis RAYMOND, MD;  [facility];  Service: Endoscopy;  Laterality: N/A;  930   FLEXIBLE SIGMOIDOSCOPY  01/17/2012   Procedure: FLEXIBLE SIGMOIDOSCOPY;  Surgeon: Claudis RAYMOND Rivet, MD;  [facility];  Service: Endoscopy;  Laterality: N/A;   GIVENS CAPSULE STUDY N/A 08/03/2019   Procedure: GIVENS CAPSULE STUDY (performed for IDA)->some food debris in stomach and small amount of blood.  Surgeon: Rivet Claudis RAYMOND, MD;  [facility];  Service: Endoscopy;  Laterality: N/A;  730AM   HEMILAMINOTOMY LUMBAR SPINE Bilateral 09/07/1999   L4-5   KNEE ARTHROSCOPY Right 01/1999; 10/2000   LEFT ATRIAL APPENDAGE OCCLUSION N/A 01/23/2023   Procedure: LEFT ATRIAL APPENDAGE OCCLUSION;  Surgeon: Wonda Sharper, MD;  Location: [facility] INVASIVE CV LAB;  Service: Cardiovascular;  Laterality: N/A;   LEFT ATRIAL APPENDAGE OCCLUSION     LYSIS OF ADHESION  02/10/2001   MALONEY DILATION  09/27/2019   Procedure: MALONEY DILATION;  Surgeon: Rivet Claudis RAYMOND, MD;  [facility];  Service: Endoscopy;;   NASAL ENDOSCOPY WITH EPISTAXIS CONTROL Bilateral 01/25/2022   Procedure: NASAL ENDOSCOPY WITH EPISTAXIS CONTROL;  Surgeon: Mable Lenis, MD;  Location: [facility];  Service: ENT;  Laterality: Bilateral;   NASAL SEPTOPLASTY W/ TURBINOPLASTY Bilateral 01/25/2022   Procedure: NASAL SEPTOPLASTY WITH TURBINATE REDUCTION;  Surgeon: Mable Lenis, MD;  Location: [facility];  Service: ENT;  Laterality: Bilateral;   RECTOCELE REPAIR  1990; 09/12/2006   TARSAL TUNNEL RELEASE  2002   TEE WITHOUT CARDIOVERSION N/A 01/23/2023   Procedure: TRANSESOPHAGEAL ECHOCARDIOGRAM;  Surgeon: Wonda Sharper, MD;  Location: [facility] INVASIVE CV LAB;  Service: Cardiovascular;  Laterality: N/A;   TRANSESOPHAGEAL ECHOCARDIOGRAM (CATH LAB) N/A 08/26/2023   4mm leak around watchman device. Procedure: TRANSESOPHAGEAL ECHOCARDIOGRAM;  Surgeon: Loni Soyla LABOR, MD;  Location: [facility]AS[facility] CV LAB;  Service: Cardiovascular;  Laterality: N/A;   TRANSTHORACIC  ECHOCARDIOGRAM  08/04/2015   EF 60-65%, normal wall motion, mild LVH, mild LA dilation, grd I DD.   TUMOR EXCISION Left 03/21/2003   dorsal 1st web space (hand)   URETEROLYSIS Right 02/10/2001    Outpatient Medications Prior to Visit  Medication Sig Dispense Refill   Accu-Chek Softclix Lancets lancets USE TO CHECK BLOOD GLUCOSE UP TO 6 TIMES DAILY AS DIRECTED 200 each 5   aspirin  EC 81 MG tablet Take 1 tablet (81 mg total) by mouth daily. Swallow whole.     blood glucose meter kit and supplies KIT Use up to six times daily as directed. DX. E11.9 1 each 0   canagliflozin  (INVOKANA ) 100 MG TABS tablet Take 1 tablet (100 mg total) by mouth daily before breakfast. 90  tablet 1   citalopram  (CELEXA ) 20 MG tablet Take 1 tablet (20 mg total) by mouth daily. 90 tablet 1   dicyclomine  (BENTYL ) 10 MG capsule TAKE 1 CAPSULE (10 MG TOTAL) BY MOUTH EVERY 8 (EIGHT) HOURS AS NEEDED FOR SPASMS (ABDOMINAL PAIN). 270 capsule 1   diphenoxylate -atropine  (LOMOTIL ) 2.5-0.025 MG tablet Take 1 tablet by mouth 4 (four) times daily as needed for diarrhea or loose stools. 60 tablet 2   Dulaglutide  (TRULICITY ) 1.5 MG/0.5ML SOPN INJECT 1.5MG  ONCE WEEKLY 18 mL 3   esomeprazole  (NEXIUM ) 40 MG capsule Take 1 capsule (40 mg total) by mouth daily before breakfast. 90 capsule 3   ferrous sulfate  325 (65 FE) MG tablet Take 1 tablet (325 mg total) by mouth daily with breakfast. 90 tablet 3   furosemide  (LASIX ) 40 MG tablet Take 1 tablet (40 mg total) by mouth daily. 90 tablet 1   glucose blood (ACCU-CHEK GUIDE) test strip USE TO CHECK BLOOD GLUCOSE UP TO 6 TIMES DAILY AS DIRECTED 600 strip 3   INFLIXIMAB  IV Inject into the vein every 8 (eight) weeks. Remicaid 5mg /kg Infusion every 8 weeks [facility])     Insulin  Pen Needle (B-D UF III MINI PEN NEEDLES) 31G X 5 MM MISC USE TO INJECT INSULINS EQUAL TO 6 TIMES DAILY. 500 each 6   insulin  regular human CONCENTRATED (HUMULIN  R U-500 KWIKPEN) 500 UNIT/ML KwikPen Inject  100 units into the skin at breakfast and lunch. 48 mL 1   lactase (LACTAID) 3000 UNITS tablet Take 3,000 Units by mouth as needed (when eating foods containing dairy).      meclizine  (ANTIVERT ) 25 MG tablet Take 1 tablet (25 mg total) by mouth 3 (three) times daily as needed for dizziness or nausea. 90 tablet 0   methocarbamol  (ROBAXIN ) 500 MG tablet TAKE 1 TABLET BY MOUTH EVERY 8 HOURS AS NEEDED FOR MUSCLE SPASMS. 20 tablet 0   metoprolol  succinate (TOPROL -XL) 100 MG 24 hr tablet TAKE 1 TABLET BY MOUTH 2 TIMES DAILY. TAKE WITH OR IMMEDIATELY FOLLOWING A MEAL 180 tablet 1   oxybutynin  (DITROPAN  XL) 5 MG 24 hr tablet Take 1 tablet (5 mg total) by mouth at bedtime. 30 tablet 0   potassium chloride  (KLOR-CON  M10) 10 MEQ tablet Take 1 tablet (10 mEq total) by mouth daily. 90 tablet 1   acetaminophen  (TYLENOL ) 325 MG tablet Take 650 mg by mouth every 6 (six) hours as needed for moderate pain or headache.     gabapentin  (NEURONTIN ) 600 MG tablet TAKE 1 AND 1/2 TABLETS BY MOUTH TWICE A DAY. MUST KEEP APPT FOR FURTHER REFILLS 90 tablet 0   No facility-administered medications prior to visit.    Allergies  Allergen Reactions   Flagyl  [Metronidazole  Hcl] Other (See Comments)    DIAPHORESIS   Omeprazole  Anaphylaxis and Swelling    SWELLING OF TONGUE AND THROAT   Actos [Pioglitazone] Swelling and Other (See Comments)    Weight gain, tongue swelling    Benzocaine-Menthol Swelling    SWELLING OF MOUTH   Metformin  And Related Diarrhea   Shrimp [Shellfish Allergy] Itching    OF THROAT AND EARS, if consumed raw   Statins Palpitations   Welchol [Colesevelam] Other (See Comments)    GI UPSET   Allevyn Adhesive [Wound Dressings]     Other reaction(s): Unknown   Desipramine  Hcl Itching, Nausea Only and Other (See Comments)    swimmy headed, ears itched    Hydromorphone  Itching   Jardiance  [Empagliflozin ] Other (See Comments)  weakness   Lactose Intolerance (Gi) Diarrhea    Gas, bloating    Adhesive [Tape] Other (See Comments)    SKIN IRRITATION AND BRUISING   Nisoldipine Itching   Percocet [Oxycodone -Acetaminophen ] Itching    Review of Systems  As per HPI  PE:    09/30/2023   11:24 AM 09/25/2023    9:05 AM 09/25/2023    8:25 AM  Vitals with BMI  Height   5' 2  Weight 218 lbs 6 oz  200 lbs  BMI 39.94  36.57  Systolic 169 127 859  Diastolic 80 72 80  Pulse 57 117 112     Physical Exam Exam chaperoned by Cloe Motsinger, CMA  Gen: alert, tired appearing but in no distress. Affect is pleasant, lucid thought and speech. CV: RRR, no m/r/g.   LUNGS: CTA bilat, nonlabored resps, good aeration in all lung fields. Back: She has focal tenderness to palpation over the SI regions bilaterally.  No lumbar tenderness to palpation.  She has mild tenderness to palpation over the right greater trochanter region. No sciatic notch or ischial tuberosity tenderness.  Range of motion of the hips is a little bit limited because it elicits the pain in her SI joints. She is unable to lie supine so sacral thrust and pelvic compression testing could not be done. Sitting straight leg raise negative bilaterally. Strength is 5- out of 5 proximally and distally in both legs, some limitation simply due to pain.   LABS:  Last CBC Lab Results  Component Value Date   WBC 3.6 (L) 09/19/2023   HGB 9.6 (L) 09/19/2023   HCT 32.5 (L) 09/19/2023   MCV 80.4 09/19/2023   MCH 23.8 (L) 09/19/2023   RDW 18.6 (H) 09/19/2023   PLT 113 (L) 09/19/2023   Lab Results  Component Value Date   IRON  28 (L) 08/22/2023   TIBC 335 08/22/2023   FERRITIN 6 (L) 08/22/2023   Lab Results  Component Value Date   VITAMINB12 469 08/22/2023   Last metabolic panel Lab Results  Component Value Date   GLUCOSE 227 (H) 09/19/2023   NA 135 09/19/2023   K 3.3 (L) 09/19/2023   CL 100 09/19/2023   CO2 25 09/19/2023   BUN 11 09/19/2023   CREATININE 0.66 09/19/2023   GFRNONAA >60 09/19/2023   CALCIUM  8.7 (L)  09/19/2023   PROT 6.8 09/19/2023   ALBUMIN 2.9 (L) 09/19/2023   LABGLOB 3.4 08/17/2015   AGRATIO 1.0 08/17/2015   BILITOT 1.0 09/19/2023   ALKPHOS 63 09/19/2023   AST 38 09/19/2023   ALT 20 09/19/2023   ANIONGAP 10 09/19/2023   Last hemoglobin A1c Lab Results  Component Value Date   HGBA1C 8.5 (A) 05/29/2023   HGBA1C 8.5 05/29/2023   HGBA1C 8.5 (A) 05/29/2023   HGBA1C 8.5 (A) 05/29/2023    IMPRESSION AND PLAN:  Subacute sacroiliac pain. Need to further evaluate for IBD- associated sacroiliitis. Check CBC, metabolic panel, sed rate, CRP, and HLA-B27. Check lumbosacral radiographs. Additionally, we will check MR sacrum without contrast.  Of note, she did have an MRI of sacrum with and without contrast 10/09/2022 but her pain was localized in the lumbar spine at that time. Her current pain is definitely sacroiliac region bilaterally.  For treatment we will do hydrocodone /APAP, 1-2 tabs twice daily as needed, #60. Therapeutic expectations and side effect profile of medication discussed today.  Patient's questions answered. Of note, Percocet and hydromorphone  caused significant itching in the past but patient recalls no problem  with this when she has taken hydrocodone  in the past.  Spent 45 min with pt today reviewing HPI, reviewing relevant past history, doing exam, reviewing and discussing lab and imaging data, and formulating plans.  An After Visit Summary was printed and given to the patient.  FOLLOW UP: Return in about 2 weeks (around 10/14/2023) for f/u sacral pain. Her next [facility] appointment is December 03, 2023. Signed:  Gerlene Hockey, MD           09/30/2023

## 2023-10-03 ENCOUNTER — Encounter (INDEPENDENT_AMBULATORY_CARE_PROVIDER_SITE_OTHER): Payer: Self-pay | Admitting: *Deleted

## 2023-10-03 LAB — CBC WITH DIFFERENTIAL/PLATELET
Absolute Lymphocytes: 1892 {cells}/uL (ref 850–3900)
Absolute Monocytes: 428 {cells}/uL (ref 200–950)
Basophils Absolute: 40 {cells}/uL (ref 0–200)
Basophils Relative: 1 %
Eosinophils Absolute: 88 {cells}/uL (ref 15–500)
Eosinophils Relative: 2.2 %
HCT: 31.7 % — ABNORMAL LOW (ref 35.0–45.0)
Hemoglobin: 9.3 g/dL — ABNORMAL LOW (ref 11.7–15.5)
MCH: 23.8 pg — ABNORMAL LOW (ref 27.0–33.0)
MCHC: 29.3 g/dL — ABNORMAL LOW (ref 32.0–36.0)
MCV: 81.1 fL (ref 80.0–100.0)
MPV: 11.9 fL (ref 7.5–12.5)
Monocytes Relative: 10.7 %
Neutro Abs: 1552 {cells}/uL (ref 1500–7800)
Neutrophils Relative %: 38.8 %
Platelets: 131 10*3/uL — ABNORMAL LOW (ref 140–400)
RBC: 3.91 10*6/uL (ref 3.80–5.10)
RDW: 16.9 % — ABNORMAL HIGH (ref 11.0–15.0)
Total Lymphocyte: 47.3 %
WBC: 4 10*3/uL (ref 3.8–10.8)

## 2023-10-03 LAB — BASIC METABOLIC PANEL
BUN: 11 mg/dL (ref 7–25)
CO2: 28 mmol/L (ref 20–32)
Calcium: 8.9 mg/dL (ref 8.6–10.4)
Chloride: 100 mmol/L (ref 98–110)
Creat: 0.66 mg/dL (ref 0.60–1.00)
Glucose, Bld: 317 mg/dL — ABNORMAL HIGH (ref 65–99)
Potassium: 4.4 mmol/L (ref 3.5–5.3)
Sodium: 138 mmol/L (ref 135–146)

## 2023-10-03 LAB — SEDIMENTATION RATE: Sed Rate: 29 mm/h (ref 0–30)

## 2023-10-03 LAB — C-REACTIVE PROTEIN: CRP: 4.6 mg/L (ref <8.0)

## 2023-10-03 LAB — HLA-B27 ANTIGEN: HLA-B27 Antigen: NEGATIVE

## 2023-10-16 NOTE — Telephone Encounter (Signed)
Patient was transferred to me by E2C2 agent. Greer Pickerel was checking on his form and his wife's form that he dropped off and gave to me on 09/29/23.  I told patient I would have to check with Chloe and Dr. Milinda Cave on status.  If we are unable to locate form, we can call [facility] to complete over the phone. [phone]  Or fax # [phone]  Please call and update patient. [phone]

## 2023-10-17 NOTE — Telephone Encounter (Signed)
Form has been refaxed.

## 2023-10-23 ENCOUNTER — Telehealth: Payer: Self-pay

## 2023-10-23 NOTE — Telephone Encounter (Signed)
Patient has been scheduled for telephone visit on 1/28 APP Robin Searing, NP gave the ok to use provider use slot consent and med rec done     Patient Consent for Virtual Visit         [name] has provided verbal consent on 10/23/2023 for a virtual visit (video or telephone).   CONSENT FOR VIRTUAL VISIT FOR:  [name]  By participating in this virtual visit I agree to the following:  I hereby voluntarily request, consent and authorize [facility] and its employed or contracted physicians, physician assistants, nurse practitioners or other licensed health care professionals (the Practitioner), to provide me with telemedicine health care services (the "Services") as deemed necessary by the treating Practitioner. I acknowledge and consent to receive the Services by the Practitioner via telemedicine. I understand that the telemedicine visit will involve communicating with the Practitioner through live audiovisual communication technology and the disclosure of certain medical information by electronic transmission. I acknowledge that I have been given the opportunity to request an in-person assessment or other available alternative prior to the telemedicine visit and am voluntarily participating in the telemedicine visit.  I understand that I have the right to withhold or withdraw my consent to the use of telemedicine in the course of my care at any time, without affecting my right to future care or treatment, and that the Practitioner or I may terminate the telemedicine visit at any time. I understand that I have the right to inspect all information obtained and/or recorded in the course of the telemedicine visit and may receive copies of available information for a reasonable fee.  I understand that some of the potential risks of receiving the Services via telemedicine include:  Delay or interruption in medical evaluation due to technological equipment failure or  disruption; Information transmitted may not be sufficient (e.g. poor resolution of images) to allow for appropriate medical decision making by the Practitioner; and/or  In rare instances, security protocols could fail, causing a breach of personal health information.  Furthermore, I acknowledge that it is my responsibility to provide information about my medical history, conditions and care that is complete and accurate to the best of my ability. I acknowledge that Practitioner's advice, recommendations, and/or decision may be based on factors not within their control, such as incomplete or inaccurate data provided by me or distortions of diagnostic images or specimens that may result from electronic transmissions. I understand that the practice of medicine is not an exact science and that Practitioner makes no warranties or guarantees regarding treatment outcomes. I acknowledge that a copy of this consent can be made available to me via my patient portal [facility] MyChart), or I can request a printed copy by calling the office of [facility].    I understand that my insurance will be billed for this visit.   I have read or had this consent read to me. I understand the contents of this consent, which adequately explains the benefits and risks of the Services being provided via telemedicine.  I have been provided ample opportunity to ask questions regarding this consent and the Services and have had my questions answered to my satisfaction. I give my informed consent for the services to be provided through the use of telemedicine in my medical care

## 2023-10-23 NOTE — Telephone Encounter (Signed)
   Name: [name]  DOB: [birth year]  MRN: [id number]  Primary Cardiologist: Olga Millers, MD   Preoperative team, please contact this patient and set up a phone call appointment for further preoperative risk assessment. Please obtain consent and complete medication review. Thank you for your help.  I confirm that guidance regarding antiplatelet and oral anticoagulation therapy has been completed and, if necessary, noted below.  Regarding ASA therapy, we recommend continuation of ASA throughout the perioperative period.  However, if the surgeon feels that cessation of ASA is required in the perioperative period, it may be stopped 5-7 days prior to surgery with a plan to resume it as soon as felt to be feasible from a surgical standpoint in the post-operative period.   I also confirmed the patient resides in the state of [place]. As per [facility] Medical Board telemedicine laws, the patient must reside in the state in which the provider is licensed.   Napoleon Form, Leodis Rains, NP 10/23/2023, 12:58 PM [facility]

## 2023-10-23 NOTE — Telephone Encounter (Signed)
   Pre-operative Risk Assessment    Patient Name: [name]  DOB: 19-Jul-1946 MRN: [id number]   Date of last office visit: 07/23/23 Date of next office visit: n/a  Request for Surgical Clearance    Procedure:  Dental Extraction - Amount of Teeth to be Pulled:  #31 with bone grafting and future implant under IV sedation   Date of Surgery:  Clearance TBD                                 Surgeon:  Not indicated  Surgeon's Group or Practice Name:  [facility]  Phone number:  [phone] Fax number:  [phone]   Type of Clearance Requested:   - Medical  - Pharmacy:  Hold Aspirin Not indicated    Type of Anesthesia:   IV sedation    Additional requests/questions:    Vance Peper   10/23/2023, 12:50 PM

## 2023-10-23 NOTE — Telephone Encounter (Signed)
Patient has been scheduled for telephone visit on 1/28 APP Robin Searing, NP gave the ok to use provider use slot

## 2023-10-28 ENCOUNTER — Telehealth: Payer: Self-pay | Admitting: Cardiology

## 2023-10-28 ENCOUNTER — Ambulatory Visit: Payer: Medicare PPO | Attending: Physician Assistant | Admitting: Physician Assistant

## 2023-10-28 DIAGNOSIS — Z01810 Encounter for preprocedural cardiovascular examination: Secondary | ICD-10-CM

## 2023-10-28 NOTE — Telephone Encounter (Signed)
Notes faxed to surgeon. This phone note will be removed from the preop pool. Tereso Newcomer, PA-C  10/28/2023 3:22 PM

## 2023-10-28 NOTE — Telephone Encounter (Signed)
Patient is calling because she has a dental appt scheduled for tomorrow, but [facility] has not received the clearance. Patient stated their office closes at 5 PM today. Patient would like to refax the clearance to [phone]. Patient is requesting a call back once this has been completed.

## 2023-10-28 NOTE — Progress Notes (Signed)
Virtual Visit via Telephone Note   Because of [name]'s co-morbid illnesses, she is at least at moderate risk for complications without adequate follow up.  This format is felt to be most appropriate for this patient at this time.  The patient did not have access to video technology/had technical difficulties with video requiring transitioning to audio format only (telephone).  All issues noted in this document were discussed and addressed.  No physical exam could be performed with this format.  Please refer to the patient's chart for her consent to telehealth for [facility].  Evaluation Performed:  Preoperative cardiovascular risk assessment _____________   Date:  10/28/2023   Patient ID:  [name], [name] 01-Aug-1946, MRN [id number] Patient Location:  [facility] Provider location:   Office  Primary Care Provider:  Jeoffrey Massed, MD Primary Cardiologist:  Olga Millers, MD  Chief Complaint / Patient Profile   78 y.o. y/o female with a h/o   Paroxysmal atrial fibrillation Frequent epistaxis >> S/p LAAOD (Watchman) 12/2022 TEE 08/06/2023: Small leak around Watchman device measuring 4 mm, EF 60-65, trivial MR Coronary calcification Pre--Watchman CT: CAC score 623 (89th percentile) Iron deficiency anemia Recurrent epistaxis Ulcerative colitis Hyperlipidemia Hypertension Diabetes mellitus OSA  She is pending dental surgery under [facility] and presents today for telephonic preoperative cardiovascular risk assessment.    History of Present Illness    [name] is a 78 y.o. female who presents via audio/video conferencing for a telehealth visit today.  Pt was last seen in cardiology clinic on 08/15/2023 by Dr. Excell Seltzer.  At that time CHAUNICE OBIE was doing well.  The patient is now pending procedure as outlined above. Since her last visit, she has remained stable.  She has not had chest pain.  She has not  had any significant shortness of breath.  She notes some questionable dyspnea at times when she lays flat.  She has not had any significant weight gain, lower extremity edema.  She has not had syncope.    Physical Exam    Vital Signs:  TESA MEADORS does not have vital signs available for review today.  Given telephonic nature of communication, physical exam is limited. AAOx3. NAD. Normal affect.  Speech and respirations are unlabored.  Accessory Clinical Findings    None    Assessment & Plan   Assessment & Plan Preoperative cardiovascular examination Ms. Achterberg's perioperative risk of a major cardiac event is 0.9% according to the Revised Cardiac Risk Index (RCRI).  Therefore, she is at low risk for perioperative complications.   Her functional capacity is fair at 5.07 METs according to the Duke Activity Status Index (DASI). Recommendations: According to ACC/AHA guidelines, no further cardiovascular testing needed.  The patient may proceed to surgery at acceptable risk.   Review of [facility] from [facility] indicates she no longer needs SBE prophylaxis. However, the pt notes that she has been given antibiotics to take for her procedure. It is reasonable to go ahead and take antibiotic prophylaxis as she has been directed.    The patient was advised that if she develops new symptoms prior to surgery to contact our office to arrange for a follow-up visit, and she verbalized understanding.  A copy of this note will be routed to requesting surgeon.  Time:   Today, I have spent 9 minutes with the patient with telehealth technology discussing medical history, symptoms, and management plan.  Tereso Newcomer, PA-C  10/28/2023, 1:55 PM

## 2023-10-28 NOTE — Telephone Encounter (Signed)
Dr. Deirdre Peer office called inquiring about clearance. I have reviewed the chart and the pt has been cleared however it seems that the fax # was entered incorrectly on our end. I assured the dental office I will send notes now to the correct fax [phone]. I apologized for the error on our end.

## 2023-10-29 ENCOUNTER — Encounter (HOSPITAL_COMMUNITY): Payer: Self-pay | Admitting: Gastroenterology

## 2023-10-31 ENCOUNTER — Telehealth: Payer: Self-pay | Admitting: Cardiology

## 2023-10-31 ENCOUNTER — Encounter (HOSPITAL_COMMUNITY): Payer: Self-pay

## 2023-10-31 ENCOUNTER — Emergency Department (HOSPITAL_COMMUNITY): Payer: Medicare PPO

## 2023-10-31 ENCOUNTER — Inpatient Hospital Stay (HOSPITAL_COMMUNITY): Payer: Medicare PPO

## 2023-10-31 ENCOUNTER — Inpatient Hospital Stay (HOSPITAL_COMMUNITY)
Admission: EM | Admit: 2023-10-31 | Discharge: 2023-11-05 | DRG: 291 | Disposition: A | Discharge status: Home Health | Payer: Medicare PPO | Attending: Internal Medicine | Admitting: Internal Medicine

## 2023-10-31 ENCOUNTER — Other Ambulatory Visit: Payer: Self-pay

## 2023-10-31 DIAGNOSIS — I517 Cardiomegaly: Secondary | ICD-10-CM | POA: Diagnosis not present

## 2023-10-31 DIAGNOSIS — I480 Paroxysmal atrial fibrillation: Secondary | ICD-10-CM | POA: Diagnosis present

## 2023-10-31 DIAGNOSIS — Z833 Family history of diabetes mellitus: Secondary | ICD-10-CM

## 2023-10-31 DIAGNOSIS — J811 Chronic pulmonary edema: Secondary | ICD-10-CM | POA: Diagnosis not present

## 2023-10-31 DIAGNOSIS — R5381 Other malaise: Secondary | ICD-10-CM | POA: Diagnosis present

## 2023-10-31 DIAGNOSIS — R918 Other nonspecific abnormal finding of lung field: Secondary | ICD-10-CM | POA: Diagnosis not present

## 2023-10-31 DIAGNOSIS — M797 Fibromyalgia: Secondary | ICD-10-CM | POA: Diagnosis present

## 2023-10-31 DIAGNOSIS — E785 Hyperlipidemia, unspecified: Secondary | ICD-10-CM | POA: Diagnosis present

## 2023-10-31 DIAGNOSIS — Z043 Encounter for examination and observation following other accident: Secondary | ICD-10-CM | POA: Diagnosis not present

## 2023-10-31 DIAGNOSIS — E139 Other specified diabetes mellitus without complications: Secondary | ICD-10-CM | POA: Diagnosis not present

## 2023-10-31 DIAGNOSIS — Z79899 Other long term (current) drug therapy: Secondary | ICD-10-CM | POA: Diagnosis not present

## 2023-10-31 DIAGNOSIS — I700 Atherosclerosis of aorta: Secondary | ICD-10-CM | POA: Diagnosis not present

## 2023-10-31 DIAGNOSIS — Z91048 Other nonmedicinal substance allergy status: Secondary | ICD-10-CM

## 2023-10-31 DIAGNOSIS — I5031 Acute diastolic (congestive) heart failure: Secondary | ICD-10-CM | POA: Diagnosis not present

## 2023-10-31 DIAGNOSIS — J9811 Atelectasis: Secondary | ICD-10-CM | POA: Diagnosis not present

## 2023-10-31 DIAGNOSIS — Z8711 Personal history of peptic ulcer disease: Secondary | ICD-10-CM

## 2023-10-31 DIAGNOSIS — Z6841 Body Mass Index (BMI) 40.0 and over, adult: Secondary | ICD-10-CM

## 2023-10-31 DIAGNOSIS — R0989 Other specified symptoms and signs involving the circulatory and respiratory systems: Secondary | ICD-10-CM | POA: Diagnosis not present

## 2023-10-31 DIAGNOSIS — Z794 Long term (current) use of insulin: Secondary | ICD-10-CM | POA: Diagnosis not present

## 2023-10-31 DIAGNOSIS — Z825 Family history of asthma and other chronic lower respiratory diseases: Secondary | ICD-10-CM

## 2023-10-31 DIAGNOSIS — I5033 Acute on chronic diastolic (congestive) heart failure: Secondary | ICD-10-CM | POA: Diagnosis present

## 2023-10-31 DIAGNOSIS — E739 Lactose intolerance, unspecified: Secondary | ICD-10-CM | POA: Diagnosis present

## 2023-10-31 DIAGNOSIS — K7460 Unspecified cirrhosis of liver: Secondary | ICD-10-CM | POA: Diagnosis not present

## 2023-10-31 DIAGNOSIS — R040 Epistaxis: Secondary | ICD-10-CM | POA: Diagnosis present

## 2023-10-31 DIAGNOSIS — F411 Generalized anxiety disorder: Secondary | ICD-10-CM | POA: Diagnosis present

## 2023-10-31 DIAGNOSIS — I4891 Unspecified atrial fibrillation: Secondary | ICD-10-CM | POA: Diagnosis present

## 2023-10-31 DIAGNOSIS — Z91013 Allergy to seafood: Secondary | ICD-10-CM

## 2023-10-31 DIAGNOSIS — I10 Essential (primary) hypertension: Secondary | ICD-10-CM | POA: Diagnosis present

## 2023-10-31 DIAGNOSIS — K5190 Ulcerative colitis, unspecified, without complications: Secondary | ICD-10-CM | POA: Diagnosis present

## 2023-10-31 DIAGNOSIS — J984 Other disorders of lung: Secondary | ICD-10-CM | POA: Diagnosis not present

## 2023-10-31 DIAGNOSIS — I2510 Atherosclerotic heart disease of native coronary artery without angina pectoris: Secondary | ICD-10-CM | POA: Diagnosis present

## 2023-10-31 DIAGNOSIS — F32A Depression, unspecified: Secondary | ICD-10-CM | POA: Diagnosis not present

## 2023-10-31 DIAGNOSIS — M533 Sacrococcygeal disorders, not elsewhere classified: Secondary | ICD-10-CM | POA: Diagnosis present

## 2023-10-31 DIAGNOSIS — I110 Hypertensive heart disease with heart failure: Principal | ICD-10-CM | POA: Diagnosis present

## 2023-10-31 DIAGNOSIS — M160 Bilateral primary osteoarthritis of hip: Secondary | ICD-10-CM | POA: Diagnosis present

## 2023-10-31 DIAGNOSIS — R0602 Shortness of breath: Secondary | ICD-10-CM | POA: Diagnosis not present

## 2023-10-31 DIAGNOSIS — Z7982 Long term (current) use of aspirin: Secondary | ICD-10-CM

## 2023-10-31 DIAGNOSIS — R0600 Dyspnea, unspecified: Secondary | ICD-10-CM | POA: Diagnosis not present

## 2023-10-31 DIAGNOSIS — Z8249 Family history of ischemic heart disease and other diseases of the circulatory system: Secondary | ICD-10-CM

## 2023-10-31 DIAGNOSIS — K219 Gastro-esophageal reflux disease without esophagitis: Secondary | ICD-10-CM | POA: Diagnosis present

## 2023-10-31 DIAGNOSIS — I509 Heart failure, unspecified: Principal | ICD-10-CM

## 2023-10-31 DIAGNOSIS — J90 Pleural effusion, not elsewhere classified: Secondary | ICD-10-CM | POA: Diagnosis not present

## 2023-10-31 DIAGNOSIS — K766 Portal hypertension: Secondary | ICD-10-CM | POA: Diagnosis not present

## 2023-10-31 DIAGNOSIS — E66813 Obesity, class 3: Secondary | ICD-10-CM | POA: Diagnosis present

## 2023-10-31 DIAGNOSIS — Z886 Allergy status to analgesic agent status: Secondary | ICD-10-CM

## 2023-10-31 DIAGNOSIS — Z885 Allergy status to narcotic agent status: Secondary | ICD-10-CM

## 2023-10-31 DIAGNOSIS — Z800 Family history of malignant neoplasm of digestive organs: Secondary | ICD-10-CM

## 2023-10-31 DIAGNOSIS — E1140 Type 2 diabetes mellitus with diabetic neuropathy, unspecified: Secondary | ICD-10-CM | POA: Diagnosis present

## 2023-10-31 DIAGNOSIS — T148XXS Other injury of unspecified body region, sequela: Secondary | ICD-10-CM

## 2023-10-31 DIAGNOSIS — E119 Type 2 diabetes mellitus without complications: Secondary | ICD-10-CM

## 2023-10-31 DIAGNOSIS — Z888 Allergy status to other drugs, medicaments and biological substances status: Secondary | ICD-10-CM

## 2023-10-31 DIAGNOSIS — J918 Pleural effusion in other conditions classified elsewhere: Secondary | ICD-10-CM | POA: Diagnosis present

## 2023-10-31 DIAGNOSIS — G4733 Obstructive sleep apnea (adult) (pediatric): Secondary | ICD-10-CM | POA: Diagnosis present

## 2023-10-31 DIAGNOSIS — R161 Splenomegaly, not elsewhere classified: Secondary | ICD-10-CM | POA: Diagnosis not present

## 2023-10-31 DIAGNOSIS — Z90710 Acquired absence of both cervix and uterus: Secondary | ICD-10-CM

## 2023-10-31 DIAGNOSIS — Z48813 Encounter for surgical aftercare following surgery on the respiratory system: Secondary | ICD-10-CM | POA: Diagnosis not present

## 2023-10-31 DIAGNOSIS — M170 Bilateral primary osteoarthritis of knee: Secondary | ICD-10-CM | POA: Diagnosis present

## 2023-10-31 DIAGNOSIS — Z95818 Presence of other cardiac implants and grafts: Secondary | ICD-10-CM

## 2023-10-31 DIAGNOSIS — Z7984 Long term (current) use of oral hypoglycemic drugs: Secondary | ICD-10-CM | POA: Diagnosis not present

## 2023-10-31 DIAGNOSIS — I5023 Acute on chronic systolic (congestive) heart failure: Secondary | ICD-10-CM | POA: Diagnosis not present

## 2023-10-31 LAB — BASIC METABOLIC PANEL
Anion gap: 10 (ref 5–15)
BUN: 13 mg/dL (ref 8–23)
CO2: 26 mmol/L (ref 22–32)
Calcium: 8.3 mg/dL — ABNORMAL LOW (ref 8.9–10.3)
Chloride: 100 mmol/L (ref 98–111)
Creatinine, Ser: 0.71 mg/dL (ref 0.44–1.00)
GFR, Estimated: >60 mL/min (ref >60)
Glucose, Bld: 357 mg/dL — ABNORMAL HIGH (ref 70–99)
Potassium: 3.7 mmol/L (ref 3.5–5.1)
Sodium: 136 mmol/L (ref 135–145)

## 2023-10-31 LAB — CBC WITH DIFFERENTIAL/PLATELET
Abs Immature Granulocytes: 0.01 10*3/uL (ref 0.00–0.07)
Basophils Absolute: 0.1 10*3/uL (ref 0.0–0.1)
Basophils Relative: 1 %
Eosinophils Absolute: 0.1 10*3/uL (ref 0.0–0.5)
Eosinophils Relative: 2 %
HCT: 31.1 % — ABNORMAL LOW (ref 36.0–46.0)
Hemoglobin: 9.1 g/dL — ABNORMAL LOW (ref 12.0–15.0)
Immature Granulocytes: 0 %
Lymphocytes Relative: 35 %
Lymphs Abs: 1.6 10*3/uL (ref 0.7–4.0)
MCH: 24.3 pg — ABNORMAL LOW (ref 26.0–34.0)
MCHC: 29.3 g/dL — ABNORMAL LOW (ref 30.0–36.0)
MCV: 83.2 fL (ref 80.0–100.0)
Monocytes Absolute: 0.4 10*3/uL (ref 0.1–1.0)
Monocytes Relative: 10 %
Neutro Abs: 2.3 10*3/uL (ref 1.7–7.7)
Neutrophils Relative %: 52 %
Platelets: 164 10*3/uL (ref 150–400)
RBC: 3.74 MIL/uL — ABNORMAL LOW (ref 3.87–5.11)
RDW: 19.6 % — ABNORMAL HIGH (ref 11.5–15.5)
WBC: 4.4 10*3/uL (ref 4.0–10.5)
nRBC: 0 % (ref 0.0–0.2)

## 2023-10-31 LAB — MAGNESIUM: Magnesium: 1.6 mg/dL — ABNORMAL LOW (ref 1.7–2.4)

## 2023-10-31 LAB — BRAIN NATRIURETIC PEPTIDE: B Natriuretic Peptide: 556 pg/mL — ABNORMAL HIGH (ref 0.0–100.0)

## 2023-10-31 LAB — GLUCOSE, CAPILLARY: Glucose-Capillary: 374 mg/dL — ABNORMAL HIGH (ref 70–99)

## 2023-10-31 LAB — TROPONIN I (HIGH SENSITIVITY): Troponin I (High Sensitivity): 3 ng/L (ref <18)

## 2023-10-31 MED ORDER — PANTOPRAZOLE SODIUM 40 MG PO TBEC
40.0000 mg | DELAYED_RELEASE_TABLET | Freq: Every day | ORAL | Status: DC
  Administered 2023-11-01 – 2023-11-05 (×5): 40 mg via ORAL
  Filled 2023-10-31 (×5): qty 1

## 2023-10-31 MED ORDER — MAGNESIUM SULFATE 2 GM/50ML IV SOLN
2.0000 g | Freq: Once | INTRAVENOUS | Status: AC
  Administered 2023-10-31: 2 g via INTRAVENOUS
  Filled 2023-10-31: qty 50

## 2023-10-31 MED ORDER — HYDROCODONE-ACETAMINOPHEN 5-325 MG PO TABS
1.0000 | ORAL_TABLET | Freq: Four times a day (QID) | ORAL | Status: DC | PRN
  Administered 2023-11-01 – 2023-11-05 (×9): 1 via ORAL
  Filled 2023-10-31 (×9): qty 1

## 2023-10-31 MED ORDER — ALBUTEROL SULFATE (2.5 MG/3ML) 0.083% IN NEBU
2.5000 mg | INHALATION_SOLUTION | RESPIRATORY_TRACT | Status: DC | PRN
  Administered 2023-11-02 (×2): 2.5 mg via RESPIRATORY_TRACT
  Filled 2023-10-31 (×3): qty 3

## 2023-10-31 MED ORDER — FUROSEMIDE 10 MG/ML IJ SOLN
40.0000 mg | Freq: Two times a day (BID) | INTRAMUSCULAR | Status: DC
  Administered 2023-11-01: 40 mg via INTRAVENOUS
  Filled 2023-10-31: qty 4

## 2023-10-31 MED ORDER — LACTASE 3000 UNITS PO TABS: 3000.0000 [IU] | ORAL_TABLET | ORAL | Status: DC | PRN

## 2023-10-31 MED ORDER — NITROGLYCERIN 0.4 MG SL SUBL
0.4000 mg | SUBLINGUAL_TABLET | Freq: Once | SUBLINGUAL | Status: AC
  Administered 2023-10-31: 0.4 mg via SUBLINGUAL
  Filled 2023-10-31: qty 1

## 2023-10-31 MED ORDER — FUROSEMIDE 10 MG/ML IJ SOLN
40.0000 mg | Freq: Once | INTRAMUSCULAR | Status: AC
Start: 2023-10-31 — End: 2023-10-31
  Administered 2023-10-31: 40 mg via INTRAVENOUS
  Filled 2023-10-31: qty 4

## 2023-10-31 MED ORDER — INSULIN ASPART 100 UNIT/ML IJ SOLN
0.0000 [IU] | Freq: Three times a day (TID) | INTRAMUSCULAR | Status: DC
  Administered 2023-11-01: 4 [IU] via SUBCUTANEOUS
  Administered 2023-11-01 (×2): 7 [IU] via SUBCUTANEOUS
  Administered 2023-11-02 – 2023-11-03 (×3): 4 [IU] via SUBCUTANEOUS
  Administered 2023-11-03: 3 [IU] via SUBCUTANEOUS
  Administered 2023-11-04: 4 [IU] via SUBCUTANEOUS
  Administered 2023-11-04: 7 [IU] via SUBCUTANEOUS

## 2023-10-31 MED ORDER — POTASSIUM CHLORIDE CRYS ER 10 MEQ PO TBCR
10.0000 meq | EXTENDED_RELEASE_TABLET | Freq: Every day | ORAL | Status: DC
  Administered 2023-11-01: 10 meq via ORAL
  Filled 2023-10-31 (×2): qty 1

## 2023-10-31 MED ORDER — METOPROLOL SUCCINATE ER 50 MG PO TB24
100.0000 mg | ORAL_TABLET | Freq: Every day | ORAL | Status: DC
Start: 2023-10-31 — End: 2023-10-31

## 2023-10-31 MED ORDER — HEPARIN SODIUM (PORCINE) 5000 UNIT/ML IJ SOLN
5000.0000 [IU] | Freq: Three times a day (TID) | INTRAMUSCULAR | Status: DC
  Administered 2023-10-31 – 2023-11-05 (×14): 5000 [IU] via SUBCUTANEOUS
  Filled 2023-10-31 (×14): qty 1

## 2023-10-31 MED ORDER — POLYETHYLENE GLYCOL 3350 17 G PO PACK: 17.0000 g | PACK | Freq: Every day | ORAL | Status: DC | PRN

## 2023-10-31 MED ORDER — METOPROLOL SUCCINATE ER 50 MG PO TB24
100.0000 mg | ORAL_TABLET | Freq: Two times a day (BID) | ORAL | Status: DC
  Administered 2023-10-31 – 2023-11-05 (×7): 100 mg via ORAL
  Filled 2023-10-31: qty 4
  Filled 2023-10-31 (×4): qty 2
  Filled 2023-10-31 (×3): qty 4
  Filled 2023-10-31: qty 2
  Filled 2023-10-31: qty 4

## 2023-10-31 MED ORDER — ASPIRIN 81 MG PO TBEC
81.0000 mg | DELAYED_RELEASE_TABLET | Freq: Every day | ORAL | Status: DC
Start: 2023-11-01 — End: 2023-11-05
  Administered 2023-11-01 – 2023-11-05 (×5): 81 mg via ORAL
  Filled 2023-10-31 (×5): qty 1

## 2023-10-31 MED ORDER — CITALOPRAM HYDROBROMIDE 20 MG PO TABS: 20.0000 mg | ORAL_TABLET | Freq: Every day | ORAL | Status: DC

## 2023-10-31 MED ORDER — GABAPENTIN 300 MG PO CAPS
600.0000 mg | ORAL_CAPSULE | Freq: Two times a day (BID) | ORAL | Status: DC
Start: 2023-10-31 — End: 2023-11-05
  Administered 2023-10-31 – 2023-11-05 (×10): 600 mg via ORAL
  Filled 2023-10-31 (×10): qty 2

## 2023-10-31 MED ORDER — OXYBUTYNIN CHLORIDE ER 5 MG PO TB24
5.0000 mg | ORAL_TABLET | Freq: Every day | ORAL | Status: DC
  Administered 2023-10-31 – 2023-11-04 (×5): 5 mg via ORAL
  Filled 2023-10-31 (×5): qty 1

## 2023-10-31 MED ORDER — INSULIN ASPART 100 UNIT/ML IJ SOLN
0.0000 [IU] | Freq: Every day | INTRAMUSCULAR | Status: DC
  Administered 2023-10-31: 5 [IU] via SUBCUTANEOUS
  Administered 2023-11-01: 3 [IU] via SUBCUTANEOUS
  Administered 2023-11-04: 2 [IU] via SUBCUTANEOUS

## 2023-10-31 MED ORDER — INSULIN GLARGINE-YFGN 100 UNIT/ML ~~LOC~~ SOLN
15.0000 [IU] | Freq: Two times a day (BID) | SUBCUTANEOUS | Status: DC
  Administered 2023-10-31: 15 [IU] via SUBCUTANEOUS
  Filled 2023-10-31 (×2): qty 0.15

## 2023-10-31 MED ORDER — ACETAMINOPHEN 325 MG PO TABS
650.0000 mg | ORAL_TABLET | Freq: Four times a day (QID) | ORAL | Status: DC | PRN
  Administered 2023-11-02: 650 mg via ORAL
  Filled 2023-10-31: qty 2

## 2023-10-31 MED ORDER — POTASSIUM CHLORIDE CRYS ER 20 MEQ PO TBCR
40.0000 meq | EXTENDED_RELEASE_TABLET | Freq: Once | ORAL | Status: AC
  Administered 2023-10-31: 40 meq via ORAL
  Filled 2023-10-31: qty 2

## 2023-10-31 NOTE — Progress Notes (Signed)
Placed CPAP in room , patient stated she was having nausea -- CPAP on HOLD.

## 2023-10-31 NOTE — Telephone Encounter (Signed)
Spoke to Dr Herbie Baltimore concerning patient. He recommended that patient go to the ED as well. He did advised patient can take 80 mg of Lasix and she still needs to go to the ED. Called patient with no answer unable to leave voicemail mailbox was full.

## 2023-10-31 NOTE — Telephone Encounter (Signed)
Pt c/o Shortness Of Breath: STAT if SOB developed within the last 24 hours or pt is noticeably SOB on the phone  1. Are you currently SOB (can you hear that pt is SOB on the phone)? no  2. How long have you been experiencing SOB? Patient states she has been experiencing the SOB for two days.  3. Are you SOB when sitting or when up moving around? Patient states the SOB is when she is laying down.   4. Are you currently experiencing any other symptoms? Patient states she is having no other symptoms.

## 2023-10-31 NOTE — Telephone Encounter (Addendum)
Called and spoke to patient. Verified name and DOB. Patient report she has been having increased SOB, swelling in her hands and feet. Patient also think she has gained weight due to her clothes feeling tighter. Patient was having difficulty speaking and having to stop to take breathes while talking. Patient was advised to go to the ED for evaluation and she refused. I tried to at least go to urgent care and she wanted an appt. Advised her first available appt was 2/14 at 11/20 with Oris Drone, NP and she accepted. Reinforced that she should still go to the ED and she refused. Advised her if symptoms worsen or new symptoms develop to go to the ED and not wait for her appt. Patient verbalized understanding and agree. Will talk to DOD for recommendations.

## 2023-10-31 NOTE — ED Provider Notes (Signed)
EMERGENCY DEPARTMENT AT [facility] Provider Note   CSN: [id number] Arrival date & time: 10/31/23  1641     History  Chief Complaint  Patient presents with   Shortness of Breath    [name] is a 78 y.o. female, history of CHF, paroxysmal A-fib, status post watchmen, who presents to the ED secondary to shortness of breath, has been going on for the last 2 days.  She states she has had progressive shortness of breath, difficulty wearing her close, as they are all tight, and difficulty putting on her shoes for the last 2 days.  Family member at bedside, states she has been having difficulty talking on the phone in full sentences, going on for the last 2 weeks, and her daughter-in-law has also noticed this.  She denies any chest pain, nausea, vomiting.  Has been compliant with her furosemide 40 mg daily, but called her cardiologist as she is having worsening shortness of breath, and was told to come to the ER.  She does not weigh herself at home, that she does not know if she has gained any weight.  Home Medications Prior to Admission medications   Medication Sig Start Date End Date Taking? Authorizing Provider  amoxicillin (AMOXIL) 500 MG capsule Take 500 mg by mouth 3 (three) times daily. 10/14/23  Yes [provider]  Accu-Chek Softclix Lancets lancets USE TO CHECK BLOOD GLUCOSE UP TO 6 TIMES DAILY AS DIRECTED 02/13/23   McGowen, Maryjean Morn, MD  aspirin EC 81 MG tablet Take 1 tablet (81 mg total) by mouth daily. Swallow whole. 07/23/23   Georgie Chard D, NP  blood glucose meter kit and supplies KIT Use up to six times daily as directed. DX. E11.9 01/05/20   McGowen, Maryjean Morn, MD  canagliflozin (INVOKANA) 100 MG TABS tablet Take 1 tablet (100 mg total) by mouth daily before breakfast. 05/29/23   McGowen, Maryjean Morn, MD  citalopram (CELEXA) 20 MG tablet Take 1 tablet (20 mg total) by mouth daily. 05/29/23   McGowen, Maryjean Morn, MD  dicyclomine (BENTYL) 10 MG capsule  TAKE 1 CAPSULE (10 MG TOTAL) BY MOUTH EVERY 8 (EIGHT) HOURS AS NEEDED FOR SPASMS (ABDOMINAL PAIN). 09/20/22   Dolores Frame, MD  diphenoxylate-atropine (LOMOTIL) 2.5-0.025 MG tablet Take 1 tablet by mouth 4 (four) times daily as needed for diarrhea or loose stools. 12/10/22   Dolores Frame, MD  Dulaglutide (TRULICITY) 1.5 MG/0.5ML SOPN INJECT 1.5MG  ONCE WEEKLY 09/04/22   McGowen, Maryjean Morn, MD  esomeprazole (NEXIUM) 40 MG capsule Take 1 capsule (40 mg total) by mouth daily before breakfast. 09/25/23   Marguerita Merles, Reuel Boom, MD  ferrous sulfate 325 (65 FE) MG tablet Take 1 tablet (325 mg total) by mouth daily with breakfast. 09/25/23   Marguerita Merles, Reuel Boom, MD  furosemide (LASIX) 40 MG tablet Take 1 tablet (40 mg total) by mouth daily. 05/29/23   McGowen, Maryjean Morn, MD  gabapentin (NEURONTIN) 600 MG tablet TAKE 1 AND 1/2 TABLETS BY MOUTH TWICE A DAY 09/30/23   McGowen, Maryjean Morn, MD  glucose blood (ACCU-CHEK GUIDE) test strip USE TO CHECK BLOOD GLUCOSE UP TO 6 TIMES DAILY AS DIRECTED 03/04/23   McGowen, Maryjean Morn, MD  HYDROcodone-acetaminophen (NORCO/VICODIN) 5-325 MG tablet 1-2 tabs po bid prn pain 09/30/23   McGowen, Maryjean Morn, MD  INFLIXIMAB IV Inject into the vein every 8 (eight) weeks. Remicaid 5mg /kg Infusion every 8 weeks [facility])    [provider]  Insulin  Pen Needle (B-D UF III MINI PEN NEEDLES) 31G X 5 MM MISC USE TO INJECT INSULINS EQUAL TO 6 TIMES DAILY. 03/04/23   McGowen, Maryjean Morn, MD  insulin regular human CONCENTRATED (HUMULIN R U-500 KWIKPEN) 500 UNIT/ML KwikPen Inject 100 units into the skin at breakfast and lunch. 06/05/23   McGowen, Maryjean Morn, MD  lactase (LACTAID) 3000 UNITS tablet Take 3,000 Units by mouth as needed (when eating foods containing dairy).     [provider]  meclizine (ANTIVERT) 25 MG tablet Take 1 tablet (25 mg total) by mouth 3 (three) times daily as needed for dizziness or nausea. 12/07/15   Pincus Sanes, MD   methocarbamol (ROBAXIN) 500 MG tablet TAKE 1 TABLET BY MOUTH EVERY 8 HOURS AS NEEDED FOR MUSCLE SPASMS. 07/28/23   McGowen, Maryjean Morn, MD  metoprolol succinate (TOPROL-XL) 100 MG 24 hr tablet TAKE 1 TABLET BY MOUTH 2 TIMES DAILY. TAKE WITH OR IMMEDIATELY FOLLOWING A MEAL 05/29/23   McGowen, Maryjean Morn, MD  oxybutynin (DITROPAN XL) 5 MG 24 hr tablet Take 1 tablet (5 mg total) by mouth at bedtime. 08/08/23   McGowen, Maryjean Morn, MD  potassium chloride (KLOR-CON M10) 10 MEQ tablet Take 1 tablet (10 mEq total) by mouth daily. 05/29/23   McGowen, Maryjean Morn, MD  bromocriptine (PARLODEL) 2.5 MG tablet Take 2.5 mg by mouth 2 (two) times daily.  12/05/11  [provider]      Allergies    Flagyl [metronidazole hcl], Omeprazole, Pioglitazone, Benzocaine-menthol, Metformin and related, Shrimp [shellfish allergy], Statins, Welchol [colesevelam], Allevyn adhesive [wound dressings], Jardiance [empagliflozin], Lactose, Lactose intolerance (gi), Adhesive [tape], Desipramine hcl, Hydromorphone, Nisoldipine, and Percocet [oxycodone-acetaminophen]    Review of Systems   Review of Systems  Respiratory:  Positive for shortness of breath.   Cardiovascular:  Negative for chest pain.    Physical Exam Updated Vital Signs BP (!) 152/88 (BP Location: Right Arm)   Pulse (!) 108   Temp 99.4 F (37.4 C) (Oral)   Resp 16   Ht 5\' 2"  (1.575 m)   Wt 100 kg   SpO2 96%   BMI 40.32 kg/m  Physical Exam Vitals and nursing note reviewed.  Constitutional:      General: She is not in acute distress.    Appearance: She is well-developed.  HENT:     Head: Normocephalic and atraumatic.  Eyes:     Conjunctiva/sclera: Conjunctivae normal.  Cardiovascular:     Rate and Rhythm: Normal rate. Rhythm irregular.     Heart sounds: No murmur heard. Pulmonary:     Effort: Pulmonary effort is normal. Tachypnea present. No respiratory distress.     Comments: +coarse breath sounds throughout Abdominal:     Palpations: Abdomen is  soft.     Tenderness: There is no abdominal tenderness.  Musculoskeletal:        General: No swelling.     Cervical back: Neck supple.     Right lower leg: 2+ Pitting Edema present.     Left lower leg: 2+ Pitting Edema present.  Skin:    General: Skin is warm and dry.     Capillary Refill: Capillary refill takes less than 2 seconds.  Neurological:     Mental Status: She is alert.  Psychiatric:        Mood and Affect: Mood normal.     ED Results / Procedures / Treatments   Labs (all labs ordered are listed, but only abnormal results are displayed) Labs Reviewed  BRAIN NATRIURETIC  PEPTIDE - Abnormal; Notable for the following components:      Result Value   B Natriuretic Peptide 556.0 (*)    All other components within normal limits  CBC WITH DIFFERENTIAL/PLATELET - Abnormal; Notable for the following components:   RBC 3.74 (*)    Hemoglobin 9.1 (*)    HCT 31.1 (*)    MCH 24.3 (*)    MCHC 29.3 (*)    RDW 19.6 (*)    All other components within normal limits  BASIC METABOLIC PANEL - Abnormal; Notable for the following components:   Glucose, Bld 357 (*)    Calcium 8.3 (*)    All other components within normal limits  TROPONIN I (HIGH SENSITIVITY)    EKG None  Radiology DG Chest 2 View Result Date: 10/31/2023 CLINICAL DATA:  Shortness of breath EXAM: CHEST - 2 VIEW COMPARISON:  01/23/2023 FINDINGS: Lateral view nondiagnostic secondary to technique and patient size. The AP frontal view is also limited by patient's size. Apical lordotic positioning. Patient rotated to the left. Mild cardiomegaly. Limited evaluation for pleural fluid. No pneumothorax. Low lung volumes. Interstitial prominence is at least partially felt to be secondary. No upper lung consolidation. Lung bases poorly evaluated. IMPRESSION: Moderately limited exam as detailed above. Cardiomegaly and low lung volumes. Possible mild interstitial edema. Consider repeat using PA and lateral technique if possible. The  patient underwent high quality PA and lateral radiographs of 01/23/2023. Electronically Signed   By: Jeronimo Greaves M.D.   On: 10/31/2023 18:20    Procedures Procedures    Medications Ordered in ED Medications  nitroGLYCERIN (NITROSTAT) SL tablet 0.4 mg (0.4 mg Sublingual Given 10/31/23 1725)  furosemide (LASIX) injection 40 mg (40 mg Intravenous Given 10/31/23 1726)    ED Course/ Medical Decision Making/ A&P                                 Medical Decision Making Patient is a 78 year old female, here for shortness of breath, has been going on for the last couple weeks, she has been having difficulty with her sentences, and been short of breath, and had some weight gain.  Her close her Levophed.  She was told to come in by her cardiologist, for further management.  She appears fluid overloaded on exam, we will obtain chest x-ray, troponin, given shortness of breath, and age, as well as EKG for further evaluation.  We also give her nitroglycerin and IV Lasix, to help vasodilate, and drain  Amount and/or Complexity of Data Reviewed Labs: ordered.    Details: BNP is slightly elevated at 550, no electrolyte abnormalities Radiology: ordered.    Details: Chest x-ray shows poor imaging Discussion of management or test interpretation with external provider(s): Patient is not having the relief, after the nitroglycerin and the Lasix, that I would like, I believe that this is likely secondary to fluid buildup.  I believe that she requires inpatient hospitalization, for CHF exacerbation because of, she will need repeated doses, of IV Lasix.  I discussed this with the patient who is in agreement with plan.  She was admitted to Dr. Randol Kern for further management  Risk Prescription drug management. Decision regarding hospitalization.    Final Clinical Impression(s) / ED Diagnoses Final diagnoses:  Acute on chronic congestive heart failure, unspecified heart failure type (HCC)    Rx / DC  Orders ED Discharge Orders     None  Pete Pelt, [place] 10/31/23 1850    Bethann Berkshire, MD 11/02/23 [phone]

## 2023-10-31 NOTE — H&P (Signed)
TRH H&P   Patient Demographics:    [name], is a 78 y.o. female  MRN: [id number]   DOB - [birth year]  Admit Date - 10/31/2023  Outpatient Primary MD for the patient is McGowen, Maryjean Morn, MD  Referring MD/NP/PA: Debbe Mounts  Outpatient Specialists: [facility] cardiology  Patient coming from: Home  Chief Complaint  Patient presents with   Shortness of Breath      HPI:    [name]  is a 78 y.o. 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 -Patient presents to ED secondary to progressive dyspnea, she reports fluid retention, worsening lower extremity edema, husband at bedside assist with history as well, patient reports dyspnea has been worsening over few days, patient has been sleeping on her recliner over the last 2 weeks, reports progressive lower extremity edema, orthopnea as well, reports she has been compliant with fluid restriction, salt restriction and her medication including Lasix. -In ED her workup significant for glucose 357, BNP elevated at 556, negative troponins, EKG with prolonged QTc, chest x-ray with volume overload (vascular congestion and pulmonary edema), he was started on IV Lasix and [facility] consulted to admit    Review of systems:      A full 10 point Review of Systems was done, except as stated above, all other Review of Systems were negative.   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 ? to get liver bx if she gets bariatric surgery? Mild portal hypertensive gastropathy on EGD 08/2019.   Fibromyalgia    GAD (generalized anxiety disorder)    GERD    History of hiatal hernia    History of iron deficiency anemia 12/2018   Inadequate absorption secondary to chronic/long term PPI therapy + portal hypertensive gastroduodenopathy. No GIB found on EGD, colonosc, and givens. Iron infusions X multiple.   History of thrombocytopenia 12/2011   Hyperlipidemia    Intolerant of statins   HYPERTENSION    IBS (irritable bowel syndrome)    -D.  Good response to bentyl and imodium as of 06/2018 GI f/u.   IDDM (insulin dependent diabetes mellitus)    with DPN (managed by Dr. Elvera Lennox but then in 2018 pt preferred to have me manage for her convenience)   Limited mobility    Requires a walker for arthritic pain, widespread musculoskeletal pain, and neuropathic pain.   Morbid obesity (HCC)    As of 11/2018, pt considering sleeve gastectomy vs bipass as of eval by Dr.  Newman->still considering as of 12/2019.   Nonalcoholic steatohepatitis    Viral Hep screens NEG.  CT 2015.  Transaminasemia.  U/S 07/2018 showed early changes of cirrhosis.   OSA (obstructive sleep apnea) 09/14/2015   sleep study 09/07/15: severe obstructive sleep apnea with an AHI of 72 and SaO2 low of 75%.>referred to sleep MD   Osteoarthritis    hips, shoulders, knees   PAF (paroxysmal atrial fibrillation) (HCC)    One documented episode (after getting EGD 2016).  Was on amiodarone x 3 mo.  Rate control with metoprolol + anticoag with xarelto. Watchman 12/2022, off anticoag   Portal hypertensive gastropathy (HCC)    hemorrhagic gastropathy + non-bleeding gastric ulcers on EGD 08/2023   Recurrent epistaxis    Granuloma in L nare cauterized by ENT 04/2020. Another cautery 06/2020   Small fiber neuropathy    Due to DM.  Symmetric hands and feet tingling/numbness.   Ulcerative colitis (HCC)    Remicade infusion Q 8  weeks: in clinical and endoscopic remission as of 12/2018 GI f/u.  06/11/19 rpt colonoscopy->cecal and ascending colon colitis.      Past Surgical History:  Procedure Laterality Date   ABDOMINAL HYSTERECTOMY  1980   Paps no longer indicated.   BACK SURGERY     BACTERIAL OVERGROWTH TEST N/A 07/13/2015   Procedure: BACTERIAL OVERGROWTH TEST;  Surgeon: Malissa Hippo, MD;  [facility];  Service: Endoscopy;  Laterality: N/A;  730     BILATERAL SALPINGOOPHORECTOMY  02/10/2001   BIOPSY  06/11/2019   Procedure: BIOPSY;  Surgeon: Malissa Hippo, MD;  [facility];  Service: Endoscopy;;  colon   BIOPSY  09/27/2019   Procedure: BIOPSY;  Surgeon: Malissa Hippo, MD;  [facility];  Service: Endoscopy;;  gastric duodenal   BIOPSY  09/15/2020   Procedure: BIOPSY;  Surgeon: Dolores Frame, MD;  [facility];  Service: Gastroenterology;;   BIOPSY  09/25/2023   Procedure: BIOPSY;  Surgeon: Dolores Frame, MD;  [facility];  Service: Gastroenterology;;   BREAST REDUCTION SURGERY  1994   bilat   BREAST SURGERY     CARDIOVASCULAR STRESS TEST  07/2010   Lexiscan myoview: normal   CARPAL TUNNEL RELEASE Right 1996   CARPAL TUNNEL RELEASE Left 03/21/2003   CARPAL TUNNEL RELEASE Right 05/04/2013   Procedure: CARPAL TUNNEL RELEASE;  Surgeon: Wyn Forster., MD;  Location: [facility];  Service: Orthopedics;  Laterality: Right;   CARPAL TUNNEL RELEASE Left 09/21/2013   Procedure: LEFT CARPAL TUNNEL RELEASE;  Surgeon: Wyn Forster., MD;  Location:  SURGERY CENTER;  Service: Orthopedics;  Laterality: Left;   CHOLECYSTECTOMY     COLONOSCOPY WITH PROPOFOL N/A 08/04/2015   Colitis in remission.  No polyps.  Procedure: COLONOSCOPY WITH PROPOFOL;  Surgeon: Malissa Hippo, MD;  Location: [facility];  Service: Endoscopy;  Laterality: N/A;  cecum time in  0820   time out  0827    total time 7 minutes    COLONOSCOPY WITH PROPOFOL N/A 06/11/2019   cecal and ascending colon colitis.  Procedure: COLONOSCOPY WITH PROPOFOL;  Surgeon: Malissa Hippo, MD;  [facility];  Service: Endoscopy;  Laterality: N/A;  730a   COLONOSCOPY WITH PROPOFOL N/A 09/15/2020   Procedure: COLONOSCOPY WITH PROPOFOL;  Surgeon: Dolores Frame, MD;  [facility];  Service: Gastroenterology;  Laterality: N/A;  1030   ESOPHAGEAL DILATION N/A 08/04/2015  Procedure: ESOPHAGEAL DILATION;  Surgeon: Malissa Hippo, MD;  [facility]: [facility];  Service: Endoscopy;  Laterality: N/ALauralee Evener, no mucousal disruption   ESOPHAGOGASTRODUODENOSCOPY  09/27/2019   Performed for IDA.  Esoph dilation was done but no stricture present.  Mild portal hypertensive gastropathy, o/w normal.  Duodenal bx NEG.  h pylori neg.   ESOPHAGOGASTRODUODENOSCOPY     09/25/2023 inactive chronic gastritis, h pylor neg. benign   ESOPHAGOGASTRODUODENOSCOPY     09/25/23 hemorrhagic portal gastropathy + nonbleeding gastric ulcers (H pylori neg)   ESOPHAGOGASTRODUODENOSCOPY (EGD) WITH ESOPHAGEAL DILATION  12/02/2005   ESOPHAGOGASTRODUODENOSCOPY (EGD) WITH PROPOFOL N/A 08/04/2015   Procedure: ESOPHAGOGASTRODUODENOSCOPY (EGD) WITH PROPOFOL;  Surgeon: Malissa Hippo, MD;  [facility]: [facility];  Service: Endoscopy;  Laterality: N/A;  procedure 1   ESOPHAGOGASTRODUODENOSCOPY (EGD) WITH PROPOFOL N/A 09/27/2019   Procedure: ESOPHAGOGASTRODUODENOSCOPY (EGD) WITH PROPOFOL;  Surgeon: Malissa Hippo, MD;  [facility];  Service: Endoscopy;  Laterality: N/A;  12:10   ESOPHAGOGASTRODUODENOSCOPY (EGD) WITH PROPOFOL N/A 10/31/2021   Procedure: ESOPHAGOGASTRODUODENOSCOPY (EGD) WITH PROPOFOL;  Surgeon: Malissa Hippo, MD;  [facility];  Service: Endoscopy;  Laterality: N/A;  930   ESOPHAGOGASTRODUODENOSCOPY (EGD) WITH PROPOFOL N/A 09/25/2023   Procedure: ESOPHAGOGASTRODUODENOSCOPY (EGD) WITH PROPOFOL;  Surgeon: Dolores Frame, MD;  [facility];  Service: Gastroenterology;  Laterality: N/A;  10:30AM;ASA 3   FLEXIBLE SIGMOIDOSCOPY  01/17/2012   Procedure: FLEXIBLE SIGMOIDOSCOPY;  Surgeon: Malissa Hippo, MD;  [facility];  Service: Endoscopy;  Laterality: N/A;   GIVENS CAPSULE STUDY N/A 08/03/2019   Procedure: GIVENS CAPSULE STUDY (performed for IDA)->some food debris in stomach and small amount of blood.  Surgeon: Malissa Hippo, MD;  [facility];  Service: Endoscopy;  Laterality: N/A;  730AM   HEMILAMINOTOMY LUMBAR SPINE Bilateral 09/07/1999   L4-5   HOT HEMOSTASIS  09/25/2023   Procedure: HOT HEMOSTASIS (ARGON PLASMA COAGULATION/BICAP);  Surgeon: Marguerita Merles, Reuel Boom, MD;  [facility];  Service: Gastroenterology;;   KNEE ARTHROSCOPY Right 01/1999; 10/2000   LEFT ATRIAL APPENDAGE OCCLUSION N/A 01/23/2023   Procedure: LEFT ATRIAL APPENDAGE OCCLUSION;  Surgeon: [name], MD;  Location: [facility];  Service: Cardiovascular;  Laterality: N/A;   LEFT ATRIAL APPENDAGE OCCLUSION     LYSIS OF ADHESION  02/10/2001   MALONEY DILATION  09/27/2019   Procedure: MALONEY DILATION;  Surgeon: Malissa Hippo, MD;  [facility];  Service: Endoscopy;;   NASAL ENDOSCOPY WITH EPISTAXIS CONTROL Bilateral 01/25/2022   Procedure: NASAL ENDOSCOPY WITH EPISTAXIS CONTROL;  Surgeon: Osborn Coho, MD;  Location: [facility];  Service: ENT;  Laterality: Bilateral;   NASAL SEPTOPLASTY W/ TURBINOPLASTY Bilateral 01/25/2022   Procedure: NASAL SEPTOPLASTY WITH TURBINATE REDUCTION;  Surgeon: Osborn Coho, MD;  Location: [facility] OR;  Service: ENT;  Laterality: Bilateral;   RECTOCELE REPAIR  1990; 09/12/2006   TARSAL TUNNEL RELEASE  2002   TEE WITHOUT CARDIOVERSION N/A 01/23/2023   Procedure: TRANSESOPHAGEAL ECHOCARDIOGRAM;  Surgeon: [name], MD;  Location: [facility] INVASIVE CV LAB;  Service: Cardiovascular;  Laterality: N/A;   TRANSESOPHAGEAL  ECHOCARDIOGRAM (CATH LAB) N/A 08/26/2023   4mm leak around watchman device. Procedure: TRANSESOPHAGEAL ECHOCARDIOGRAM;  Surgeon: [name], MD;  Location: [facility] INVASIVE CV LAB;  Service: Cardiovascular;  Laterality: N/A;   TRANSTHORACIC ECHOCARDIOGRAM  08/04/2015   EF 60-65%, normal wall motion, mild LVH, mild LA dilation, grd I DD.   TUMOR EXCISION Left 03/21/2003  dorsal 1st web space (hand)   URETEROLYSIS Right 02/10/2001      Social History:     Social History   Tobacco Use   Smoking status: Never   Smokeless tobacco: Never  Substance Use Topics   Alcohol use: Not Currently    Comment: ocassionally        Family History :     Family History  Problem Relation Age of Onset   Diabetes Mother    Hypertension Mother    Heart attack Father        Mid 95's   Heart disease Father    Lung disease Father        spot on lung; had lung surgery   Alcohol abuse Other    Hypertension Son    Diabetes Son    Emphysema Maternal Grandfather    Asthma Maternal Grandfather    Colon cancer Paternal Grandfather    Stomach cancer Paternal Grandmother    Neuropathy Neg Hx       Home Medications:   Prior to Admission medications   Medication Sig Start Date End Date Taking? Authorizing Provider  amoxicillin (AMOXIL) 500 MG capsule Take 500 mg by mouth 3 (three) times daily. 10/14/23  Yes [provider]  Accu-Chek Softclix Lancets lancets USE TO CHECK BLOOD GLUCOSE UP TO 6 TIMES DAILY AS DIRECTED 02/13/23   McGowen, Maryjean Morn, MD  aspirin EC 81 MG tablet Take 1 tablet (81 mg total) by mouth daily. Swallow whole. 07/23/23   Georgie Chard D, NP  blood glucose meter kit and supplies KIT Use up to six times daily as directed. DX. E11.9 01/05/20   McGowen, Maryjean Morn, MD  canagliflozin (INVOKANA) 100 MG TABS tablet Take 1 tablet (100 mg total) by mouth daily before breakfast. 05/29/23   McGowen, Maryjean Morn, MD  citalopram (CELEXA) 20 MG tablet Take 1 tablet (20 mg total) by mouth  daily. 05/29/23   McGowen, Maryjean Morn, MD  dicyclomine (BENTYL) 10 MG capsule TAKE 1 CAPSULE (10 MG TOTAL) BY MOUTH EVERY 8 (EIGHT) HOURS AS NEEDED FOR SPASMS (ABDOMINAL PAIN). 09/20/22   Dolores Frame, MD  diphenoxylate-atropine (LOMOTIL) 2.5-0.025 MG tablet Take 1 tablet by mouth 4 (four) times daily as needed for diarrhea or loose stools. 12/10/22   Dolores Frame, MD  Dulaglutide (TRULICITY) 1.5 MG/0.5ML SOPN INJECT 1.5MG  ONCE WEEKLY 09/04/22   McGowen, Maryjean Morn, MD  esomeprazole (NEXIUM) 40 MG capsule Take 1 capsule (40 mg total) by mouth daily before breakfast. 09/25/23   Marguerita Merles, Reuel Boom, MD  ferrous sulfate 325 (65 FE) MG tablet Take 1 tablet (325 mg total) by mouth daily with breakfast. 09/25/23   Marguerita Merles, Reuel Boom, MD  furosemide (LASIX) 40 MG tablet Take 1 tablet (40 mg total) by mouth daily. 05/29/23   McGowen, Maryjean Morn, MD  gabapentin (NEURONTIN) 600 MG tablet TAKE 1 AND 1/2 TABLETS BY MOUTH TWICE A DAY 09/30/23   McGowen, Maryjean Morn, MD  glucose blood (ACCU-CHEK GUIDE) test strip USE TO CHECK BLOOD GLUCOSE UP TO 6 TIMES DAILY AS DIRECTED 03/04/23   McGowen, Maryjean Morn, MD  HYDROcodone-acetaminophen (NORCO/VICODIN) 5-325 MG tablet 1-2 tabs po bid prn pain 09/30/23   McGowen, Maryjean Morn, MD  INFLIXIMAB IV Inject into the vein every 8 (eight) weeks. Remicaid 5mg /kg Infusion every 8 weeks [facility])    [provider]  Insulin Pen Needle (B-D UF III MINI PEN NEEDLES) 31G X 5 MM MISC USE TO INJECT INSULINS EQUAL TO  6 TIMES DAILY. 03/04/23   McGowen, Maryjean Morn, MD  insulin regular human CONCENTRATED (HUMULIN R U-500 KWIKPEN) 500 UNIT/ML KwikPen Inject 100 units into the skin at breakfast and lunch. 06/05/23   McGowen, Maryjean Morn, MD  lactase (LACTAID) 3000 UNITS tablet Take 3,000 Units by mouth as needed (when eating foods containing dairy).     [provider]  meclizine (ANTIVERT) 25 MG tablet Take 1 tablet (25 mg total) by mouth 3 (three)  times daily as needed for dizziness or nausea. 12/07/15   Pincus Sanes, MD  methocarbamol (ROBAXIN) 500 MG tablet TAKE 1 TABLET BY MOUTH EVERY 8 HOURS AS NEEDED FOR MUSCLE SPASMS. 07/28/23   McGowen, Maryjean Morn, MD  metoprolol succinate (TOPROL-XL) 100 MG 24 hr tablet TAKE 1 TABLET BY MOUTH 2 TIMES DAILY. TAKE WITH OR IMMEDIATELY FOLLOWING A MEAL 05/29/23   McGowen, Maryjean Morn, MD  oxybutynin (DITROPAN XL) 5 MG 24 hr tablet Take 1 tablet (5 mg total) by mouth at bedtime. 08/08/23   McGowen, Maryjean Morn, MD  potassium chloride (KLOR-CON M10) 10 MEQ tablet Take 1 tablet (10 mEq total) by mouth daily. 05/29/23   McGowen, Maryjean Morn, MD  bromocriptine (PARLODEL) 2.5 MG tablet Take 2.5 mg by mouth 2 (two) times daily.  12/05/11  [provider]     Allergies:     Allergies  Allergen Reactions   Flagyl [Metronidazole Hcl] Other (See Comments)    DIAPHORESIS   Omeprazole Anaphylaxis and Swelling    SWELLING OF TONGUE AND THROAT   Pioglitazone Other (See Comments) and Swelling    Weight gain, tongue swelling   Benzocaine-Menthol Swelling    SWELLING OF MOUTH   Metformin And Related Diarrhea   Shrimp [Shellfish Allergy] Itching    OF THROAT AND EARS, if consumed raw   Statins Palpitations   Welchol [Colesevelam] Other (See Comments)    GI UPSET   Allevyn Adhesive [Wound Dressings]     Other reaction(s): Unknown   Jardiance [Empagliflozin] Other (See Comments)    weakness   Lactose Diarrhea and Other (See Comments)    Gas, bloating   Lactose Intolerance (Gi) Diarrhea    Gas, bloating   Adhesive [Tape] Other (See Comments)    SKIN IRRITATION AND BRUISING   Desipramine Hcl Itching, Nausea Only and Other (See Comments)    "swimmy" headed, ears itched   Hydromorphone Itching   Nisoldipine Itching   Percocet [Oxycodone-Acetaminophen] Itching     Physical Exam:   Vitals  Blood pressure (!) 152/88, pulse (!) 108, temperature 99.4 F (37.4 C), temperature source Oral, resp. rate 16, height  5\' 2"  (1.575 m), weight 100 kg, SpO2 96%.   1. General elderly female, laying in bed  2. Normal affect and insight, Not Suicidal or Homicidal, Awake Alert, Oriented X 3.  3. No F.N deficits, ALL C.Nerves Intact, Strength 5/5 all 4 extremities, Sensation intact all 4 extremities, Plantars down going.  4. Ears and Eyes appear Normal, Conjunctivae clear, PERRLA. Moist Oral Mucosa.  5. Supple Neck, positive JVD, No cervical lymphadenopathy appriciated, No Carotid Bruits.  6. Symmetrical Chest wall movement, diminished air entry at the bases with scattered Rales  7.  Irregular, tachycardic, No Gallops, Rubs or Murmurs, +2 edema  8. Positive Bowel Sounds, Abdomen Soft, No tenderness, No organomegaly appriciated,No rebound -guarding or rigidity.  9.  No Cyanosis, Normal Skin Turgor, No Skin Rash or Bruise.  10. Good muscle tone,  joints appear normal , no effusions, Normal ROM.  Tenderness to palpation in coccyx area    Data Review:    CBC Recent Labs  Lab 10/31/23 1716  WBC 4.4  HGB 9.1*  HCT 31.1*  PLT 164  MCV 83.2  MCH 24.3*  MCHC 29.3*  RDW 19.6*  LYMPHSABS 1.6  MONOABS 0.4  EOSABS 0.1  BASOSABS 0.1   ------------------------------------------------------------------------------------------------------------------  Chemistries  Recent Labs  Lab 10/31/23 1716  NA 136  K 3.7  CL 100  CO2 26  GLUCOSE 357*  BUN 13  CREATININE 0.71  CALCIUM 8.3*   ------------------------------------------------------------------------------------------------------------------ estimated creatinine clearance is 66.2 mL/min (by C-G formula based on SCr of 0.71 mg/dL). ------------------------------------------------------------------------------------------------------------------ No results for input(s): "TSH", "T4TOTAL", "T3FREE", "THYROIDAB" in the last 72 hours.  Invalid input(s): "FREET3"  Coagulation profile No results for input(s): "INR", "PROTIME" in the last 168  hours. ------------------------------------------------------------------------------------------------------------------- No results for input(s): "DDIMER" in the last 72 hours. -------------------------------------------------------------------------------------------------------------------  Cardiac Enzymes No results for input(s): "CKMB", "TROPONINI", "MYOGLOBIN" in the last 168 hours.  Invalid input(s): "CK" ------------------------------------------------------------------------------------------------------------------    Component Value Date/Time   BNP 556.0 (H) 10/31/2023 1716     ---------------------------------------------------------------------------------------------------------------  Urinalysis    Component Value Date/Time   COLORURINE YELLOW 08/08/2023 1628   APPEARANCEUR CLEAR 08/08/2023 1628   LABSPEC 1.022 08/08/2023 1628   PHURINE < OR = 5.0 08/08/2023 1628   GLUCOSEU 3+ (A) 08/08/2023 1628   GLUCOSEU >=1000 (A) 07/22/2023 1148   HGBUR 1+ (A) 08/08/2023 1628   BILIRUBINUR Negative 09/17/2023 1349   KETONESUR NEGATIVE 08/08/2023 1628   PROTEINUR Positive (A) 09/17/2023 1349   PROTEINUR NEGATIVE 08/08/2023 1628   UROBILINOGEN 0.2 09/17/2023 1349   UROBILINOGEN 2.0 (A) 07/22/2023 1148   NITRITE Negative 09/17/2023 1349   NITRITE NEGATIVE 08/08/2023 1628   LEUKOCYTESUR Negative 09/17/2023 1349   LEUKOCYTESUR NEGATIVE 08/08/2023 1628    ----------------------------------------------------------------------------------------------------------------   Imaging Results:    DG Chest 2 View Result Date: 10/31/2023 CLINICAL DATA:  Shortness of breath EXAM: CHEST - 2 VIEW COMPARISON:  01/23/2023 FINDINGS: Lateral view nondiagnostic secondary to technique and patient size. The AP frontal view is also limited by patient's size. Apical lordotic positioning. Patient rotated to the left. Mild cardiomegaly. Limited evaluation for pleural fluid. No pneumothorax. Low  lung volumes. Interstitial prominence is at least partially felt to be secondary. No upper lung consolidation. Lung bases poorly evaluated. IMPRESSION: Moderately limited exam as detailed above. Cardiomegaly and low lung volumes. Possible mild interstitial edema. Consider repeat using PA and lateral technique if possible. The patient underwent high quality PA and lateral radiographs of 01/23/2023. Electronically Signed   By: Jeronimo Greaves M.D.   On: 10/31/2023 18:20    EKG: Vent. rate 120 BPM PR interval * ms QRS duration 78 ms QT/QTcB 377/533 ms P-R-T axes * 56 12 Atrial fibrillation Anterior infarct, old Prolonged QT interval  Assessment & Plan:    Principal Problem:   Acute on chronic diastolic CHF (congestive heart failure) (HCC) Active Problems:   Essential hypertension   Dyslipidemia   PAF (paroxysmal atrial fibrillation) (HCC)   OSA (obstructive sleep apnea), severe   Cirrhosis, nonalcoholic (HCC)   Diabetes mellitus (HCC)   Atrial fibrillation (HCC)   Acute on chronic diastolic CHF Pulm edema Resp Distress -Presents with progressive dyspnea over the last few days, elevated BNP, vascular congestion/pulmonary edema on imaging -Continue with IV Lasix 40 mg IV twice daily, adjust dosing as needed and monitor renal function closely -Patient with recent echo, preserved EF, but given acute decompensation will repeat  2D echo -Continue with daily weights, low-salt diet, fluid restrictions, strict ins and out -Will obtain CT chest without contrast -Hold Invokana as she is on IV diuresis -In A-fib with RVR upon presentation heart rate in the 120s, unclear if her A-fib with RVR contributing to CHF, continue to monitor on telemetry  Paroxysmal A-fib -This post recent Watchman procedure April 2024, secondary to recurrent epistaxis while on anticoagulation, with known small leak Watchman device did on TEE 08/26/2023 -Continue with aspirin given known history of CAD, she is off Eliquis  and Plavix currently -Continue with Toprol XL, titrate for good heart rate control  Prolonged QTc-at 533,  -will hold citalopram and replete potassium for target for, and check magnesium  Nonalcoholic liver cirrhosis Appears to be compensated  OSA (obstructive sleep apnea), severe - CPAP nightly   Ulcerative colitis (HCC) Stable.  On infliximab.   Essential hypertension Blood pressure is elevated, continue with home dose Toprol-XL, will uptitrate dose if needed for better heart rate control, as well more room for IV diuresis  Fall Lower back pain -Patient reports recent fall, worsening lower back pain, she is with chronic lower back pain at baseline, she has some tenderness in the sacral area, will obtain x-ray -Consult PT/OT -Resume home dose Vicodin  Obesity class 3 Body mass index is 40.32 kg/m. -She is on Trulicity and Invokana at home  Diabetes mellitus, type 2 insulin-dependent Diabetic neuropathy -He is on insulin U-500, 100 units twice daily, it is nonformulary, so we will start on Semglee 15 units twice daily, and resistant insulin sliding scale, likely she will need her Semglee uptitrated. -Hold Invokana and Trulicity during hospital stay patient she is on IV diuresis       DVT Prophylaxis Heparin   AM Labs Ordered, also please review Full Orders  Family Communication: Admission, patients condition and plan of care including tests being ordered have been discussed with the patient and husband at bedside who indicate understanding and agree with the plan and Code Status.  Code Status full code  Likely DC to home  Consults called: None  Admission status: Inpatient  Time spent in minutes : 70 minutes   Huey Bienenstock M.D on 10/31/2023 at 7:39 PM   [facility] - Office  [phone]

## 2023-10-31 NOTE — ED Notes (Signed)
ED TO INPATIENT HANDOFF REPORT  ED Nurse Name and Phone #: Cammy Copa [phone]  S Name/Age/Gender [name] 78 y.o. female Room/Bed: APA19/APA19  Code Status   Code Status: Full Code  Home/SNF/Other Home Patient oriented to: self, place, time, and situation Is this baseline? Yes   Triage Complete: Triage complete  Chief Complaint Acute on chronic diastolic CHF (congestive heart failure) (HCC) [I50.33]  Triage Note Pt arrived via POV from home c/o SOB/ Pt called her cardiologist today and reports she was advised to come to the ER for evaluation. Pt reports she has not increased her daily lasix. Pt also reports swelling in bilateral lower extremities has been worsening X 2 days.    Allergies Allergies  Allergen Reactions   Flagyl [Metronidazole Hcl] Other (See Comments)    DIAPHORESIS   Omeprazole Anaphylaxis and Swelling    SWELLING OF TONGUE AND THROAT   Pioglitazone Other (See Comments) and Swelling    Weight gain, tongue swelling   Benzocaine-Menthol Swelling    SWELLING OF MOUTH   Metformin And Related Diarrhea   Shrimp [Shellfish Allergy] Itching    OF THROAT AND EARS, if consumed raw   Statins Palpitations   Welchol [Colesevelam] Other (See Comments)    GI UPSET   Allevyn Adhesive [Wound Dressings]     Other reaction(s): Unknown   Jardiance [Empagliflozin] Other (See Comments)    weakness   Lactose Diarrhea and Other (See Comments)    Gas, bloating   Lactose Intolerance (Gi) Diarrhea    Gas, bloating   Adhesive [Tape] Other (See Comments)    SKIN IRRITATION AND BRUISING   Desipramine Hcl Itching, Nausea Only and Other (See Comments)    "swimmy" headed, ears itched   Hydromorphone Itching   Nisoldipine Itching   Percocet [Oxycodone-Acetaminophen] Itching    Level of Care/Admitting Diagnosis ED Disposition     ED Disposition  Admit   Condition  --   Comment  H[facility]: [facility] [address] [[id number]]  Level of Care: Telemetry [5]  Covid  Evaluation: Asymptomatic - no recent exposure (last 10 days) testing not required  Diagnosis: Acute on chronic diastolic CHF (congestive heart failure) [facility]) [[id number]]  Admitting Physician: Chiquita Loth  Attending Physician: Randol Kern, DAWOOD S [[id number]]  Certification:: I certify this patient will need inpatient services for at least 2 midnights  Expected Medical Readiness: 11/03/2023          B Medical/Surgery History Past Medical History:  Diagnosis Date   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 ? to get liver bx if she gets bariatric surgery? Mild portal hypertensive gastropathy on EGD 08/2019.   Fibromyalgia    GAD (generalized anxiety disorder)    GERD    History of hiatal hernia    History of iron deficiency anemia 12/2018   Inadequate absorption secondary to chronic/long term PPI therapy + portal hypertensive gastroduodenopathy. No GIB found on EGD, colonosc, and givens. Iron infusions X multiple.   History of thrombocytopenia 12/2011   Hyperlipidemia    Intolerant of statins   HYPERTENSION    IBS (irritable bowel syndrome)    -D.  Good response to bentyl and imodium as of 06/2018 GI f/u.   IDDM (insulin dependent diabetes mellitus)    with DPN (managed by Dr. Elvera Lennox but then in 2018 pt preferred to have me manage for her convenience)   Limited mobility  Requires a walker for arthritic pain, widespread musculoskeletal pain, and neuropathic pain.   Morbid obesity (HCC)    As of 11/2018, pt considering sleeve gastectomy vs bipass as of eval by Dr. Alvino Blood considering as of 12/2019.   Nonalcoholic steatohepatitis    Viral Hep screens NEG.  CT 2015.  Transaminasemia.  U/S 07/2018 showed early changes of cirrhosis.   OSA (obstructive sleep apnea) 09/14/2015   sleep study 09/07/15: severe obstructive sleep apnea with an AHI of 72 and SaO2 low of  75%.>referred to sleep MD   Osteoarthritis    hips, shoulders, knees   PAF (paroxysmal atrial fibrillation) (HCC)    One documented episode (after getting EGD 2016).  Was on amiodarone x 3 mo.  Rate control with metoprolol + anticoag with xarelto. Watchman 12/2022, off anticoag   Portal hypertensive gastropathy (HCC)    hemorrhagic gastropathy + non-bleeding gastric ulcers on EGD 08/2023   Recurrent epistaxis    Granuloma in L nare cauterized by ENT 04/2020. Another cautery 06/2020   Small fiber neuropathy    Due to DM.  Symmetric hands and feet tingling/numbness.   Ulcerative colitis (HCC)    Remicade infusion Q 8 weeks: in clinical and endoscopic remission as of 12/2018 GI f/u.  06/11/19 rpt colonoscopy->cecal and ascending colon colitis.   Past Surgical History:  Procedure Laterality Date   ABDOMINAL HYSTERECTOMY  1980   Paps no longer indicated.   BACK SURGERY     BACTERIAL OVERGROWTH TEST N/A 07/13/2015   Procedure: BACTERIAL OVERGROWTH TEST;  Surgeon: Malissa Hippo, MD;  [facility];  Service: Endoscopy;  Laterality: N/A;  730     BILATERAL SALPINGOOPHORECTOMY  02/10/2001   BIOPSY  06/11/2019   Procedure: BIOPSY;  Surgeon: Malissa Hippo, MD;  [facility];  Service: Endoscopy;;  colon   BIOPSY  09/27/2019   Procedure: BIOPSY;  Surgeon: Malissa Hippo, MD;  [facility];  Service: Endoscopy;;  gastric duodenal   BIOPSY  09/15/2020   Procedure: BIOPSY;  Surgeon: Dolores Frame, MD;  [facility];  Service: Gastroenterology;;   BIOPSY  09/25/2023   Procedure: BIOPSY;  Surgeon: Dolores Frame, MD;  [facility];  Service: Gastroenterology;;   BREAST REDUCTION SURGERY  1994   bilat   BREAST SURGERY     CARDIOVASCULAR STRESS TEST  07/2010   Lexiscan myoview: normal   CARPAL TUNNEL RELEASE Right 1996   CARPAL TUNNEL RELEASE Left 03/21/2003   CARPAL TUNNEL RELEASE Right 05/04/2013   Procedure:  CARPAL TUNNEL RELEASE;  Surgeon: Wyn Forster., MD;  Location: [facility];  Service: Orthopedics;  Laterality: Right;   CARPAL TUNNEL RELEASE Left 09/21/2013   Procedure: LEFT CARPAL TUNNEL RELEASE;  Surgeon: Wyn Forster., MD;  Location: [facility];  Service: Orthopedics;  Laterality: Left;   CHOLECYSTECTOMY     COLONOSCOPY WITH PROPOFOL N/A 08/04/2015   Colitis in remission.  No polyps.  Procedure: COLONOSCOPY WITH PROPOFOL;  Surgeon: Malissa Hippo, MD;  [facility];  Service: Endoscopy;  Laterality: N/A;  cecum time in  0820   time out  0827    total time 7 minutes   COLONOSCOPY WITH PROPOFOL N/A 06/11/2019   cecal and ascending colon colitis.  Procedure: COLONOSCOPY WITH PROPOFOL;  Surgeon: Malissa Hippo, MD;  [facility];  Service: Endoscopy;  Laterality: N/A;  730a   COLONOSCOPY WITH PROPOFOL N/A 09/15/2020  Procedure: COLONOSCOPY WITH PROPOFOL;  Surgeon: Dolores Frame, MD;  [facility];  Service: Gastroenterology;  Laterality: N/A;  1030   ESOPHAGEAL DILATION N/A 08/04/2015   Procedure: ESOPHAGEAL DILATION;  Surgeon: Malissa Hippo, MD;  [facility];  Service: Endoscopy;  Laterality: N/ALauralee Evener, no mucousal disruption   ESOPHAGOGASTRODUODENOSCOPY  09/27/2019   Performed for IDA.  Esoph dilation was done but no stricture present.  Mild portal hypertensive gastropathy, o/w normal.  Duodenal bx NEG.  h pylori neg.   ESOPHAGOGASTRODUODENOSCOPY     09/25/2023 inactive chronic gastritis, h pylor neg. benign   ESOPHAGOGASTRODUODENOSCOPY     09/25/23 hemorrhagic portal gastropathy + nonbleeding gastric ulcers (H pylori neg)   ESOPHAGOGASTRODUODENOSCOPY (EGD) WITH ESOPHAGEAL DILATION  12/02/2005   ESOPHAGOGASTRODUODENOSCOPY (EGD) WITH PROPOFOL N/A 08/04/2015   Procedure: ESOPHAGOGASTRODUODENOSCOPY (EGD) WITH PROPOFOL;  Surgeon: Malissa Hippo, MD;  [facility]: [facility];  Service: Endoscopy;   Laterality: N/A;  procedure 1   ESOPHAGOGASTRODUODENOSCOPY (EGD) WITH PROPOFOL N/A 09/27/2019   Procedure: ESOPHAGOGASTRODUODENOSCOPY (EGD) WITH PROPOFOL;  Surgeon: Malissa Hippo, MD;  [facility];  Service: Endoscopy;  Laterality: N/A;  12:10   ESOPHAGOGASTRODUODENOSCOPY (EGD) WITH PROPOFOL N/A 10/31/2021   Procedure: ESOPHAGOGASTRODUODENOSCOPY (EGD) WITH PROPOFOL;  Surgeon: Malissa Hippo, MD;  [facility];  Service: Endoscopy;  Laterality: N/A;  930   ESOPHAGOGASTRODUODENOSCOPY (EGD) WITH PROPOFOL N/A 09/25/2023   Procedure: ESOPHAGOGASTRODUODENOSCOPY (EGD) WITH PROPOFOL;  Surgeon: Dolores Frame, MD;  [facility];  Service: Gastroenterology;  Laterality: N/A;  10:30AM;ASA 3   FLEXIBLE SIGMOIDOSCOPY  01/17/2012   Procedure: FLEXIBLE SIGMOIDOSCOPY;  Surgeon: Malissa Hippo, MD;  [facility];  Service: Endoscopy;  Laterality: N/A;   GIVENS CAPSULE STUDY N/A 08/03/2019   Procedure: GIVENS CAPSULE STUDY (performed for IDA)->some food debris in stomach and small amount of blood.  Surgeon: Malissa Hippo, MD;  [facility];  Service: Endoscopy;  Laterality: N/A;  730AM   HEMILAMINOTOMY LUMBAR SPINE Bilateral 09/07/1999   L4-5   HOT HEMOSTASIS  09/25/2023   Procedure: HOT HEMOSTASIS (ARGON PLASMA COAGULATION/BICAP);  Surgeon: Marguerita Merles, Reuel Boom, MD;  [facility];  Service: Gastroenterology;;   KNEE ARTHROSCOPY Right 01/1999; 10/2000   LEFT ATRIAL APPENDAGE OCCLUSION N/A 01/23/2023   Procedure: LEFT ATRIAL APPENDAGE OCCLUSION;  Surgeon: [name], MD;  Location[facility] CV LAB;  Service: Cardiovascular;  Laterality: N/A;   LEFT ATRIAL APPENDAGE OCCLUSION     LYSIS OF ADHESION  02/10/2001   MALONEY DILATION  09/27/2019   Procedure: MALONEY DILATION;  Surgeon: Malissa Hippo, MD;  [facility];  Service: Endoscopy;;   NASAL ENDOSCOPY WITH EPISTAXIS CONTROL Bilateral 01/25/2022    Procedure: NASAL ENDOSCOPY WITH EPISTAXIS CONTROL;  Surgeon: Osborn Coho, MD;  Location: [facility];  Service: ENT;  Laterality: Bilateral;   NASAL SEPTOPLASTY W/ TURBINOPLASTY Bilateral 01/25/2022   Procedure: NASAL SEPTOPLASTY WITH TURBINATE REDUCTION;  Surgeon: Osborn Coho, MD;  Location[facility] OR;  Service: ENT;  Laterality: Bilateral;   RECTOCELE REPAIR  1990; 09/12/2006   TARSAL TUNNEL RELEASE  2002   TEE WITHOUT CARDIOVERSION N/A 01/23/2023   Procedure: TRANSESOPHAGEAL ECHOCARDIOGRAM;  Surgeon: [name], MD;  Location: [facility] INVASIVE CV LAB;  Service: Cardiovascular;  Laterality: N/A;   TRANSESOPHAGEAL ECHOCARDIOGRAM (CATH LAB) N/A 08/26/2023   4mm leak around watchman device. Procedure: TRANSESOPHAGEAL ECHOCARDIOGRAM;  Surgeon: [name], MD;  Location: [facility] INVASIVE CV LAB;  Service: Cardiovascular;  Laterality: N/A;   TRANSTHORACIC ECHOCARDIOGRAM  08/04/2015   EF 60-65%, normal wall motion, mild LVH, mild LA dilation, grd I DD.   TUMOR EXCISION Left 03/21/2003   dorsal 1st web space (hand)   URETEROLYSIS Right 02/10/2001     A IV Location/Drains/Wounds Patient Lines/Drains/Airways Status     Active Line/Drains/Airways     Name Placement date Placement time Site Days   Peripheral IV 10/31/23 20 G Right Antecubital 10/31/23  1711  Antecubital  less than 1            Intake/Output Last 24 hours No intake or output data in the 24 hours ending 10/31/23 1948  Labs/Imaging Results for orders placed or performed during the hospital encounter of 10/31/23 (from the past 48 hours)  Brain natriuretic peptide     Status: Abnormal   Collection Time: 10/31/23  5:16 PM  Result Value Ref Range   B Natriuretic Peptide 556.0 (H) 0.0 - 100.0 pg/mL    Comment: Performed at [facility], [address] [place], [place] [address]  CBC with Differential     Status: Abnormal   Collection Time: 10/31/23  5:16 PM  Result Value Ref Range   WBC 4.4 4.0 - 10.5 K/uL   RBC 3.74  (L) 3.87 - 5.11 MIL/uL   Hemoglobin 9.1 (L) 12.0 - 15.0 g/dL   HCT 29.5 (L) 62.1 - 30.8 %   MCV 83.2 80.0 - 100.0 fL   MCH 24.3 (L) 26.0 - 34.0 pg   MCHC 29.3 (L) 30.0 - 36.0 g/dL   RDW 65.7 (H) 84.6 - 96.2 %   Platelets 164 150 - 400 K/uL   nRBC 0.0 0.0 - 0.2 %   Neutrophils Relative % 52 %   Neutro Abs 2.3 1.7 - 7.7 K/uL   Lymphocytes Relative 35 %   Lymphs Abs 1.6 0.7 - 4.0 K/uL   Monocytes Relative 10 %   Monocytes Absolute 0.4 0.1 - 1.0 K/uL   Eosinophils Relative 2 %   Eosinophils Absolute 0.1 0.0 - 0.5 K/uL   Basophils Relative 1 %   Basophils Absolute 0.1 0.0 - 0.1 K/uL   Immature Granulocytes 0 %   Abs Immature Granulocytes 0.01 0.00 - 0.07 K/uL    Comment: Performed at [facility], [address], [place], [place] [address]  Basic metabolic panel     Status: Abnormal   Collection Time: 10/31/23  5:16 PM  Result Value Ref Range   Sodium 136 135 - 145 mmol/L   Potassium 3.7 3.5 - 5.1 mmol/L   Chloride 100 98 - 111 mmol/L   CO2 26 22 - 32 mmol/L   Glucose, Bld 357 (H) 70 - 99 mg/dL    Comment: Glucose reference range applies only to samples taken after fasting for at least 8 hours.   BUN 13 8 - 23 mg/dL   Creatinine, Ser 1.32 0.44 - 1.00 mg/dL   Calcium 8.3 (L) 8.9 - 10.3 mg/dL   GFR, Estimated >44 >01 mL/min    Comment: (NOTE) Calculated using the CKD-EPI Creatinine Equation (2021)    Anion gap 10 5 - 15    Comment: Performed at [facility], [address], [place], [place] [address]  Troponin I (High Sensitivity)     Status: None   Collection Time: 10/31/23  5:21 PM  Result Value Ref Range   Troponin I (High Sensitivity) 3 <18 ng/L    Comment: (NOTE) Elevated high sensitivity troponin I (hsTnI) values and significant  changes  across serial measurements may suggest ACS but many other  chronic and acute conditions are known to elevate hsTnI results.  Refer to the "Links" section for chest pain algorithms and additional  guidance. Performed at [facility], [address], [place], [place] [address]    *Note: Due to a large number of results and/or encounters for the requested time period, some results have not been displayed. A complete set of results can be found in Results Review.   DG Chest 2 View Result Date: 10/31/2023 CLINICAL DATA:  Shortness of breath EXAM: CHEST - 2 VIEW COMPARISON:  10/31/2023 FINDINGS: Small left pleural effusion and left basilar opacities, likely atelectasis. Mild cardiomegaly. IMPRESSION: Small left pleural effusion and left basilar opacities, likely atelectasis. Electronically Signed   By: Deatra Robinson M.D.   On: 10/31/2023 19:43   DG Chest 2 View Result Date: 10/31/2023 CLINICAL DATA:  Shortness of breath EXAM: CHEST - 2 VIEW COMPARISON:  01/23/2023 FINDINGS: Lateral view nondiagnostic secondary to technique and patient size. The AP frontal view is also limited by patient's size. Apical lordotic positioning. Patient rotated to the left. Mild cardiomegaly. Limited evaluation for pleural fluid. No pneumothorax. Low lung volumes. Interstitial prominence is at least partially felt to be secondary. No upper lung consolidation. Lung bases poorly evaluated. IMPRESSION: Moderately limited exam as detailed above. Cardiomegaly and low lung volumes. Possible mild interstitial edema. Consider repeat using PA and lateral technique if possible. The patient underwent high quality PA and lateral radiographs of 01/23/2023. Electronically Signed   By: Jeronimo Greaves M.D.   On: 10/31/2023 18:20    Pending Labs Unresulted Labs (From admission, onward)     Start     Ordered   11/01/23 0500  Basic metabolic panel  Tomorrow morning,   R        10/31/23 1937   11/01/23 0500  CBC  Tomorrow morning,   R        10/31/23 1937            Vitals/Pain Today's Vitals   10/31/23 1648 10/31/23 1649 10/31/23 1650  BP: (!) 152/88    Pulse: (!) 108    Resp: 16    Temp: 99.4 F (37.4 C)    TempSrc: Oral    SpO2: 96%    Weight:   100 kg   Height:   5\' 2"  (1.575 m)  PainSc:  0-No pain     Isolation Precautions No active isolations  Medications Medications  acetaminophen (TYLENOL) tablet 650 mg (has no administration in time range)  aspirin EC tablet 81 mg (has no administration in time range)  HYDROcodone-acetaminophen (NORCO/VICODIN) 5-325 MG per tablet 1 tablet (has no administration in time range)  metoprolol succinate (TOPROL-XL) 24 hr tablet 100 mg (has no administration in time range)  citalopram (CELEXA) tablet 20 mg (has no administration in time range)  pantoprazole (PROTONIX) EC tablet 40 mg (has no administration in time range)  lactase (LACTAID) tablet 3,000 Units (has no administration in time range)  oxybutynin (DITROPAN-XL) 24 hr tablet 5 mg (has no administration in time range)  gabapentin (NEURONTIN) capsule 600 mg (has no administration in time range)  potassium chloride SA (KLOR-CON M) CR tablet 10 mEq (has no administration in time range)  furosemide (LASIX) injection 40 mg (has no administration in time range)  heparin injection 5,000 Units (has no administration in time range)  polyethylene glycol (MIRALAX / GLYCOLAX) packet 17 g (has no administration in time range)  albuterol (PROVENTIL) (  2.5 MG/3ML) 0.083% nebulizer solution 2.5 mg (has no administration in time range)  insulin aspart (novoLOG) injection 0-20 Units (has no administration in time range)  insulin aspart (novoLOG) injection 0-5 Units (has no administration in time range)  insulin glargine-yfgn (SEMGLEE) injection 15 Units (has no administration in time range)  nitroGLYCERIN (NITROSTAT) SL tablet 0.4 mg (0.4 mg Sublingual Given 10/31/23 1725)  furosemide (LASIX) injection 40 mg (40 mg Intravenous Given 10/31/23 1726)    Mobility walks     Focused Assessments Pulmonary Assessment Handoff:  Lung sounds:   O2 Device: Room Air      R Recommendations: See Admitting Provider Note  Report given to:   Additional Notes:

## 2023-10-31 NOTE — ED Triage Notes (Signed)
Pt arrived via POV from home c/o SOB/ Pt called her cardiologist today and reports she was advised to come to the ER for evaluation. Pt reports she has not increased her daily lasix. Pt also reports swelling in bilateral lower extremities has been worsening X 2 days.

## 2023-11-01 ENCOUNTER — Inpatient Hospital Stay (HOSPITAL_COMMUNITY): Payer: Medicare PPO

## 2023-11-01 DIAGNOSIS — I5033 Acute on chronic diastolic (congestive) heart failure: Secondary | ICD-10-CM | POA: Diagnosis not present

## 2023-11-01 LAB — BASIC METABOLIC PANEL
Anion gap: 6 (ref 5–15)
BUN: 15 mg/dL (ref 8–23)
CO2: 30 mmol/L (ref 22–32)
Calcium: 8.3 mg/dL — ABNORMAL LOW (ref 8.9–10.3)
Chloride: 100 mmol/L (ref 98–111)
Creatinine, Ser: 0.75 mg/dL (ref 0.44–1.00)
GFR, Estimated: >60 mL/min (ref >60)
Glucose, Bld: 259 mg/dL — ABNORMAL HIGH (ref 70–99)
Potassium: 4 mmol/L (ref 3.5–5.1)
Sodium: 136 mmol/L (ref 135–145)

## 2023-11-01 LAB — CBC
HCT: 28.2 % — ABNORMAL LOW (ref 36.0–46.0)
Hemoglobin: 8.3 g/dL — ABNORMAL LOW (ref 12.0–15.0)
MCH: 24.9 pg — ABNORMAL LOW (ref 26.0–34.0)
MCHC: 29.4 g/dL — ABNORMAL LOW (ref 30.0–36.0)
MCV: 84.7 fL (ref 80.0–100.0)
Platelets: 166 10*3/uL (ref 150–400)
RBC: 3.33 MIL/uL — ABNORMAL LOW (ref 3.87–5.11)
RDW: 19.7 % — ABNORMAL HIGH (ref 11.5–15.5)
WBC: 5.8 10*3/uL (ref 4.0–10.5)
nRBC: 0 % (ref 0.0–0.2)

## 2023-11-01 LAB — GLUCOSE, CAPILLARY
Glucose-Capillary: 201 mg/dL — ABNORMAL HIGH (ref 70–99)
Glucose-Capillary: 186 mg/dL — ABNORMAL HIGH (ref 70–99)
Glucose-Capillary: 231 mg/dL — ABNORMAL HIGH (ref 70–99)
Glucose-Capillary: 269 mg/dL — ABNORMAL HIGH (ref 70–99)

## 2023-11-01 MED ORDER — TRIMETHOBENZAMIDE HCL 100 MG/ML IM SOLN: 200.0000 mg | Freq: Four times a day (QID) | INTRAMUSCULAR | Status: DC | PRN

## 2023-11-01 MED ORDER — INSULIN GLARGINE-YFGN 100 UNIT/ML ~~LOC~~ SOLN
25.0000 [IU] | Freq: Two times a day (BID) | SUBCUTANEOUS | Status: DC
  Administered 2023-11-01 – 2023-11-05 (×9): 25 [IU] via SUBCUTANEOUS
  Filled 2023-11-01 (×12): qty 0.25

## 2023-11-01 MED ORDER — FUROSEMIDE 10 MG/ML IJ SOLN
60.0000 mg | Freq: Two times a day (BID) | INTRAMUSCULAR | Status: DC
  Administered 2023-11-01 – 2023-11-05 (×8): 60 mg via INTRAVENOUS
  Filled 2023-11-01 (×8): qty 6

## 2023-11-01 NOTE — TOC Initial Note (Signed)
Transition of Care [facility]) - Initial/Assessment Note    Patient Details  Name: [name] MRN: [id number] Date of Birth: 11-29-1945  Transition of Care [facility]) CM/SW Contact:    Otelia Santee, LCSW Phone Number: 11/01/2023, 1:51 PM  Clinical Narrative:                 Pt from home with spouse. Pt has RW, Rollator, and quad cane at home. Pt agreeable to recommendation for [facility] and does not have an agency preference. HHPT/OT has been arranged with [facility]. HH orders will need to be placed prior to discharge. TOC will continue to follow for further discharge needs.  Expected Discharge Plan: Home w Home Health Services Barriers to Discharge: Continued Medical Work up   Patient Goals and CMS Choice Patient states their goals for this hospitalization and ongoing recovery are:: To be able to go home CMS Medicare.gov Compare Post Acute Care list provided to:: Patient Choice offered to / list presented to : Patient [place] ownership interest in [facility].provided to::  (NA)    Expected Discharge Plan and Services In-house Referral: Clinical Social Work Discharge Planning Services: NA Post Acute Care Choice: Home Health Living arrangements for the past 2 months: Single Family Home                 DME Arranged: N/A DME Agency: NA       HH Arranged: PT, OT HH AgencyHotel manager Home Health Care Date [facility] Agency Contacted: 11/01/23 Time HH Agency Contacted: 1351 Representative spoke with at [facility] Agency: Cindie  Prior Living Arrangements/Services Living arrangements for the past 2 months: Single Family Home Lives with:: Spouse Patient language and need for interpreter reviewed:: Yes Do you feel safe going back to the place where you live?: Yes      Need for Family Participation in Patient Care: No (Comment) Care giver support system in place?: Yes (comment) Current home services: DME (Rolling Walker (2 wheels);Rollator (4 wheels);Cane - quad;Shower seat;Grab bars -  tub/shower;Hand held shower head) Criminal Activity/Legal Involvement Pertinent to Current Situation/Hospitalization: No - Comment as needed  Activities of Daily Living   ADL Screening (condition at time of admission) Independently performs ADLs?: Yes (appropriate for developmental age) Is the patient deaf or have difficulty hearing?: No Does the patient have difficulty seeing, even when wearing glasses/contacts?: No Does the patient have difficulty concentrating, remembering, or making decisions?: No  Permission Sought/Granted Permission sought to share information with : Facility Medical sales representative, Family Supports Permission granted to share information with : Yes, Verbal Permission Granted     Permission granted to share info w AGENCY: [facility]        Emotional Assessment Appearance:: Appears stated age Attitude/Demeanor/Rapport: Engaged, Lethargic Affect (typically observed): Accepting Orientation: : Oriented to Self, Oriented to Place, Oriented to  Time, Oriented to Situation Alcohol / Substance Use: Not Applicable Psych Involvement: No (comment)  Admission diagnosis:  Acute on chronic diastolic CHF (congestive heart failure) (HCC) [I50.33] Acute on chronic congestive heart failure, unspecified heart failure type [facility]) [I50.9] Patient Active Problem List   Diagnosis Date Noted   Acute on chronic diastolic CHF (congestive heart failure) (HCC) 10/31/2023   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 08/26/2023   Atrial fibrillation (HCC) 01/23/2023   Gastroenteritis, infectious, presumed 11/14/2022   Infectious colitis 11/14/2022   Acute metabolic encephalopathy 10/09/2022   Back pain 10/09/2022   Diabetes mellitus (HCC) 10/09/2022   Hypoglycemia 10/09/2022   IBS (irritable bowel  syndrome)    Chronic ulcerative pancolitis (HCC) 08/21/2021   Diarrhea 10/03/2020   Cirrhosis, nonalcoholic (HCC) 10/03/2020   Fecal urgency 08/02/2020   Deviated septum  07/28/2020   Epistaxis, recurrent 05/15/2020   Ulcerative colitis without complications (HCC) 08/24/2019   Gastrointestinal hemorrhage 08/24/2019   Iron deficiency anemia due to chronic blood loss 07/28/2019   Iron deficiency anemia 05/20/2019   Heme positive stool 05/20/2019   History of iron deficiency anemia 12/2018   Vertigo 12/04/2015   Uncontrolled type 2 diabetes mellitus with peripheral neuropathy 11/14/2015   OSA (obstructive sleep apnea), severe 09/14/2015   B12 deficiency 08/17/2015   Vitamin B6 deficiency 08/17/2015   PAF (paroxysmal atrial fibrillation) (HCC) 08/17/2015   Chronic anticoagulation 08/17/2015   Atrial fibrillation with RVR (HCC) 08/04/2015   Dysphagia    Gastroesophageal reflux disease without esophagitis    Lymphocytosis 07/04/2015   Obesity 05/18/2014   Allergic sinusitis 12/09/2013   Myalgia 04/28/2012   Polyarthralgia 04/28/2012   Elevated LFTs 01/16/2012   Insomnia 01/12/2012   Small fiber neuropathy 05/30/2011   Anxiety 05/30/2011   Dyslipidemia 01/24/2011   PALPITATIONS, RECURRENT 08/08/2010   Essential hypertension 04/12/2010   GERD 04/12/2010   Ulcerative colitis (HCC) 04/12/2010   PCP:  Jeoffrey Massed, MD Pharmacy:   [facility] [phone] - [place], [place] - [address] [place] [address] Phone: [phone] Fax: [phone]  [facility] - [place], [place] - [address] [place] [address] Phone: [phone] Fax: [phone]     Social Drivers of Health (SDOH) Social History: SDOH Screenings   Food Insecurity: No Food Insecurity (10/31/2023)  Housing: Low Risk  (10/31/2023)  Transportation Needs: No Transportation Needs (10/31/2023)  Utilities: Not At Risk (10/31/2023)  Alcohol Screen: Low Risk  (11/29/2020)  Depression (PHQ2-9): Low Risk  (01/15/2023)  Financial Resource Strain: Low Risk  (01/15/2023)  Physical Activity: Inactive (01/15/2023)   Social Connections: Moderately Isolated (10/31/2023)  Stress: No Stress Concern Present (01/15/2023)  Tobacco Use: Low Risk  (10/31/2023)   SDOH Interventions:     Readmission Risk Interventions    11/01/2023    1:49 PM  Readmission Risk Prevention Plan  Transportation Screening Complete  PCP or Specialist Appt within 5-7 Days Complete  Home Care Screening Complete  Medication Review (RN CM) Complete

## 2023-11-01 NOTE — Plan of Care (Signed)
   Problem: Education: Goal: Knowledge of General Education information will improve Description Including pain rating scale, medication(s)/side effects and non-pharmacologic comfort measures Outcome: Progressing   Problem: Health Behavior/Discharge Planning: Goal: Ability to manage health-related needs will improve Outcome: Progressing

## 2023-11-01 NOTE — Progress Notes (Signed)
Patient has refused CPAP for tonight. 

## 2023-11-01 NOTE — Progress Notes (Signed)
Dr. Truitt Merle erlier of patient's request for nausea med. Not wanting totake maalox. No orders given at this time. Pt resting quietly no further nausea

## 2023-11-01 NOTE — Plan of Care (Signed)
  Problem: Acute Rehab PT Goals(only PT should resolve) Goal: Pt Will Go Supine/Side To Sit Outcome: Progressing Flowsheets (Taken 11/01/2023 0859) Pt will go Supine/Side to Sit: Independently Goal: Patient Will Transfer Sit To/From Stand Outcome: Progressing Flowsheets (Taken 11/01/2023 0859) Patient will transfer sit to/from stand: with modified independence Goal: Pt Will Transfer Bed To Chair/Chair To Bed Outcome: Progressing Flowsheets (Taken 11/01/2023 0859) Pt will Transfer Bed to Chair/Chair to Bed: with modified independence Goal: Pt Will Ambulate Outcome: Progressing Flowsheets (Taken 11/01/2023 0859) Pt will Ambulate:  100 feet  with supervision  with cane

## 2023-11-01 NOTE — Progress Notes (Signed)
Dr. Lenna Gilford 2030 mm compression stocking for patient. I called materials and they said they do not carry them. I notified Dr. [place] and he said that I could just put regular knee high TED hose on patient, so I put on regular TED hose.

## 2023-11-01 NOTE — Evaluation (Signed)
Physical Therapy Evaluation Patient Details Name: [name] MRN: [id number] DOB: 1946-05-28 Today's Date: 11/01/2023  History of Present Illness  [name]  is a 78 y.o. 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  -Patient presents to ED secondary to progressive dyspnea, she reports fluid retention, worsening lower extremity edema, husband at bedside assist with history as well, patient reports dyspnea has been worsening over few days, patient has been sleeping on her recliner over the last 2 weeks, reports progressive lower extremity edema, orthopnea as well, reports she has been compliant with fluid restriction, salt restriction and her medication including Lasix.  -In ED her workup significant for glucose 357, BNP elevated at 556, negative troponins, EKG with prolonged QTc, chest x-ray with volume overload (vascular congestion and pulmonary edema), he was started on IV Lasix and [facility] hospitalist consulted to admit   Clinical Impression  Patient in bed sleeping on therapist arrival.  Her husband is at bedside.  Patient initially lethargic but rouses with encouragement.  She performs supine to sit with HOB elevated and taking extra time to perform due to general weakness.  She is able to sit on the side of the bed with Supervision demonstrating good balance when feet are supported; noted swelling bilateral LE's.  Patient performs sit to stand with CGA for safety and is able to walk out into the hallway with QC and CGA and step up onto the scale out in the hallway.  Weight today 240 lbs.  Patient walks with a slight shuffling pattern and decreased gait speed likely due to leg swelling and weakness.  She demonstrates no LOB  or path deviation.  Patient returns to sitting on the edge of the bed. patient left in bed with call button in reach and  Dr. [place] present as well as her husband at bedside.  Patient will benefit from continued skilled therapy services during the remainder of her hospital stay and at the next recommended venue of care to address deficits and promote return to optimal function.           If plan is discharge home, recommend the following: A little help with walking and/or transfers;A little help with bathing/dressing/bathroom;Help with stairs or ramp for entrance;Assistance with cooking/housework   Can travel by private vehicle        Equipment Recommendations None recommended by PT  Recommendations for Other Services       Functional Status Assessment Patient has had a recent decline in their functional status and demonstrates the ability to make significant improvements in function in a reasonable and predictable amount of time.     Precautions / Restrictions Precautions Precautions: Fall Restrictions Weight Bearing Restrictions Per Provider Order: No      Mobility  Bed Mobility Overal bed mobility: Modified Independent             General bed mobility comments: with HOB elevated; takes extra time to perform due to general weakness Patient Response: Cooperative  Transfers Overall transfer level: Needs assistance Equipment used: Quad cane Transfers: Sit to/from Stand Sit to Stand: Contact guard assist           General transfer comment: sit to stand with CGA for safety    Ambulation/Gait Ambulation/Gait assistance: Contact guard assist Gait Distance (Feet): 30 Feet  Assistive device: Quad cane Gait Pattern/deviations: Decreased step length - right, Decreased step length - left, Trunk flexed Gait velocity: decreased        Stairs            Wheelchair Mobility     Tilt Bed Tilt Bed Patient Response: Cooperative  Modified Rankin (Stroke  Patients Only)       Balance Overall balance assessment: Needs assistance Sitting-balance support: Feet supported Sitting balance-Leahy Scale: Good Sitting balance - Comments: good sitting balance on the edge of the bed   Standing balance support: Single extremity supported, During functional activity, Reliant on assistive device for balance Standing balance-Leahy Scale: Fair Standing balance comment: fair to good standing balance with QC and CGA                             Pertinent Vitals/Pain Pain Assessment Pain Assessment: No/denies pain    Home Living Family/patient expects to be discharged to:: Private residence Living Arrangements: Spouse/significant other Available Help at Discharge: Family;Available 24 hours/day Type of Home: House Home Access: Stairs to enter Entrance Stairs-Rails: Right Entrance Stairs-Number of Steps: 1   Home Layout: One level;Laundry or work area in basement;Able to live on main level with bedroom/bathroom Home Equipment: Agricultural consultant (2 wheels);Rollator (4 wheels);Cane - quad;Shower seat;Grab bars - tub/shower;Hand held shower head Additional Comments: states they are looking into a walk in shower    Prior Function Prior Level of Function : Needs assist       Physical Assist : Mobility (physical);ADLs (physical) Mobility (physical): Gait;Stairs ADLs (physical): Bathing;Dressing Mobility Comments: walks with QC outside of home; furniture walks in home or holds to her spouse ADLs Comments: husband assists with bathing,dressing; he does the grocery shopping     Extremity/Trunk Assessment   Upper Extremity Assessment Upper Extremity Assessment: Generalized weakness    Lower Extremity Assessment Lower Extremity Assessment: Generalized weakness    Cervical / Trunk Assessment Cervical / Trunk Assessment: Kyphotic  Communication   Communication Communication: No apparent difficulties  Cognition Arousal: Alert Behavior  During Therapy: WFL for tasks assessed/performed Overall Cognitive Status: Within Functional Limits for tasks assessed                                          General Comments General comments (skin integrity, edema, etc.): noted swelling bilateral lower extremities    Exercises     Assessment/Plan    PT Assessment Patient needs continued PT services  PT Problem List Decreased strength;Decreased activity tolerance;Decreased balance;Decreased mobility       PT Treatment Interventions Gait training;Functional mobility training;Therapeutic activities;Therapeutic exercise;Balance training;Patient/family education    PT Goals (Current goals can be found in the Care Plan section)  Acute Rehab PT Goals Patient Stated Goal: return home PT Goal Formulation: With patient/family Time For Goal Achievement: 11/15/23 Potential to Achieve Goals: Good    Frequency Min 2X/week     Co-evaluation               AM-PAC PT "6 Clicks" Mobility  Outcome Measure Help needed turning from your back to your side while in a flat bed without using bedrails?: None Help needed moving from lying on your back to sitting on the side of a flat bed without using bedrails?: A Little Help needed moving to and from a bed to a chair (  including a wheelchair)?: A Little Help needed standing up from a chair using your arms (e.g., wheelchair or bedside chair)?: A Little Help needed to walk in hospital room?: A Little Help needed climbing 3-5 steps with a railing? : A Lot 6 Click Score: 18    End of Session   Activity Tolerance: Patient tolerated treatment well Patient left: in bed;with call bell/phone within reach;with family/visitor present;Other (comment) (MD in room)   PT Visit Diagnosis: Unsteadiness on feet (R26.81);Other abnormalities of gait and mobility (R26.89);Muscle weakness (generalized) (M62.81)    Time: 1610-9604 PT Time Calculation (min) (ACUTE ONLY): 19  min   Charges:   PT Evaluation $PT Eval Moderate Complexity: 1 Mod   PT General Charges $$ ACUTE PT VISIT: 1 Visit         8:55 AM, 11/01/23 Marlise Fahr Small Daiana Vitiello MPT [facility] [place] [phone] [phone]

## 2023-11-01 NOTE — TOC CM/SW Note (Signed)
 [facility] Quality rating ???? Patient survey rating ????   [facility] Quality rating ????? Patient survey rating???   [facility] [phone] Quality rating???? Patient survey rating????   [facility] [phone] Quality rating???? Patient survey rating????   [facility] [phone] Quality rating ???? Patient survey rating???   [facility] Quality rating??? Patient survey rating???   [facility] [phone] Quality rating??? Patient survey rating ????   [facility] [phone] Quality rating ????   [facility] Quality rating ??? Patient survey rating ????   [facility] Quality rating ??? Patient survey rating???   [facility] Quality rating ????? Patient survey rating ???   [facility] [phone] Quality rating ????? Patient survey rating ????

## 2023-11-01 NOTE — Progress Notes (Signed)
PROGRESS NOTE    [name]  JYN:[id number] DOB: 10/16/1945 DOA: 10/31/2023 PCP: Jeoffrey Massed, MD    Brief Narrative:   [name] is a 78 y.o. female with past medical history significant for chronic diastolic congestive heart failure, proximal atrial fibrillation not on anticoagulation due to recurrent epistaxis s/p Watchman procedure (12/2022), CAD on aspirin, type 2 diabetes mellitus, anxiety/depression, nonalcoholic cirrhosis, OSA on CPAP, morbid obesity who presented to [facility] ED on 1/31 from home with progressive shortness of breath, lower extremity edema.  Patient apparently called her cardiologist and was advised to come to the ED for further evaluation.  Onset of symptoms 2 days prior to admission.  Denies headache, no chest pain, no abdominal pain, no fever/chills/night sweats, no nausea/vomiting/diarrhea, no focal weakness, no fatigue.  In the ED, temperature 99.4 F, HR 108, RR 16, BP 152/88, SpO2 96% on room air.  WBC 4.4, hemoglobin 9.1, platelet count 164.  Sodium 136, potassium 3.7, chloride 100, CO2 26, glucose 357, BUN 13, creatinine 0.71.  BNP 556.0.  High sensitive troponin 3.  Chest x-ray with cardiomegaly, interstitial edema.  Sacrum/coccyx x-ray with no acute osseous abnormality, chronic L5 compression fracture.  EKG with atrial fibrillation, rate 120, no concerning dynamic changes.  ED EDP started on IV Lasix.  TRH consulted for admission for further evaluation and management.  Assessment & Plan:   Acute on chronic diastolic congestive heart failure Patient presenting to ED with progressive shortness of breath, lower extremity edema over the last 2 days.  Called her cardiologist who directed her to the ED for further evaluation.  Has not increased her home Lasix dose which is 40 mg p.o. daily.  Patient with 2-3+ pitting edema bilateral lower extremities, chest x-ray with cardiomegaly and interstitial edema.  Patient is afebrile without leukocytosis. --  TTE: Pending -- Compression stockings -- Lasix 40 mg IV q12h -- Strict I's and O's and daily weights -- Encourage lower extremity elevation while sitting/laying -- Monitor renal function closely daily  Atrial fibrillation with RVR On arrival to the ED, EKG notable for A-fib with RVR with rate 120.  Not on chronic anticoagulation due to recurrent epistaxis and underwent Watchman procedure April 2024. -- Metoprolol succinate 100 mg p.o. daily (hold for HR <55) -- Continue monitor on telemetry  CAD -- Aspirin 81 mg p.o. daily -- Not on statin outpatient  Type 2 diabetes mellitus, with hyperglycemia Hemoglobin A1c 8.5 on 05/29/2023, not optimally controlled.  Home regimen includes insulin U-500 100 units [place] qAM and at lunch, Invokana 100 mg p.o. daily, Trulicity 1.5 mg weekly -- Holding home Invokana, Trulicity, U-500 not on formulary inpatient -- Semglee 25u [place] q12h -- Resistant SSI for coverage -- CBG before every meal/at bedtime   Nonalcoholic cirrhosis -- Sodium/fluid restriction  Anxiety/depression: -- Celexa 20 mg p.o. daily  GERD -- Protonix 40 mg p.o. daily (substituted for home Nexium)  OSA on CPAP -- Continue nocturnal CPAP  Morbid obesity, class III Body mass index is 44.27 kg/m.  Complicates all facets of care.  Weakness/debility/deconditioning: Seen by physical therapy this morning with recommendation of home health on discharge. --Continue therapy efforts while inpatient.   DVT prophylaxis: heparin injection 5,000 Units Start: 10/31/23 2200 SCDs Start: 10/31/23 1938    Code Status: Full Code Family Communication: Updated spouse present at bedside this morning  Disposition Plan:  Level of care: Telemetry Status is: Inpatient Remains inpatient appropriate because: IV Lasix    Consultants:  None  Procedures:  TTE: Pending  Antimicrobials:  None   Subjective: Patient seen examined bedside, resting calmly.  Manage of bed.  Just finished working  with physical therapy.  Spouse present at bedside.  Continues with dyspnea and lower extremity edema.  Started on IV Lasix overnight.  No other specific complaints, concerns or questions at this time.  Denies headache, no dizziness, no chest pain, no palpitations, no fever/chills/night sweats, no nausea/vomiting/diarrhea, no focal weakness, no cough/congestion, no fatigue, no paresthesia.  No acute events overnight per nursing.  Objective: Vitals:   11/01/23 0049 11/01/23 0458 11/01/23 0500 11/01/23 0900  BP: 120/81 (!) 152/60  (!) 148/60  Pulse: 65 (!) 58    Resp: (!) 22 (!) 24    Temp: 98.6 F (37 C) 97.8 F (36.6 C)    TempSrc: Oral Oral    SpO2: 95% 92%    Weight:   109.8 kg   Height:        Intake/Output Summary (Last 24 hours) at 11/01/2023 1033 Last data filed at 11/01/2023 1017 Gross per 24 hour  Intake 480 ml  Output --  Net 480 ml   Filed Weights   10/31/23 1650 11/01/23 0500  Weight: 100 kg 109.8 kg    Examination:  Physical Exam: GEN: NAD, alert and oriented x 3, obese, chronically ill/elderly in appearance HEENT: NCAT, PERRL, EOMI, sclera clear, MMM PULM: Breath sounds slight diminished lateral bases, noted crackles, no wheezing, normal respiratory effort without accessory muscle use, on room air with SpO2 92% at rest. CV: RRR w/o M/G/R GI: abd soft, NTND, NABS, no R/G/M MSK: 2-3+ pitting edema bilateral lower extremities to knee, muscle strength globally intact 5/5 bilateral upper/lower extremities NEURO: CN II-XII intact, no focal deficits, sensation to light touch intact PSYCH: normal mood/affect Integumentary: No concerning rashes/lesions/wounds noted on exposed skin surfaces.    Data Reviewed: I have personally reviewed following labs and imaging studies  CBC: Recent Labs  Lab 10/31/23 1716 11/01/23 0452  WBC 4.4 5.8  NEUTROABS 2.3  --   HGB 9.1* 8.3*  HCT 31.1* 28.2*  MCV 83.2 84.7  PLT 164 166   Basic Metabolic Panel: Recent Labs  Lab  10/31/23 1716 10/31/23 1937 11/01/23 0452  NA 136  --  136  K 3.7  --  4.0  CL 100  --  100  CO2 26  --  30  GLUCOSE 357*  --  259*  BUN 13  --  15  CREATININE 0.71  --  0.75  CALCIUM 8.3*  --  8.3*  MG  --  1.6*  --    GFR: Estimated Creatinine Clearance: 69.9 mL/min (by C-G formula based on SCr of 0.75 mg/dL). Liver Function Tests: No results for input(s): "AST", "ALT", "ALKPHOS", "BILITOT", "PROT", "ALBUMIN" in the last 168 hours. No results for input(s): "LIPASE", "AMYLASE" in the last 168 hours. No results for input(s): "AMMONIA" in the last 168 hours. Coagulation Profile: No results for input(s): "INR", "PROTIME" in the last 168 hours. Cardiac Enzymes: No results for input(s): "CKTOTAL", "CKMB", "CKMBINDEX", "TROPONINI" in the last 168 hours. BNP (last 3 results) No results for input(s): "PROBNP" in the last 8760 hours. HbA1C: No results for input(s): "HGBA1C" in the last 72 hours. CBG: Recent Labs  Lab 10/31/23 2216 11/01/23 0741  GLUCAP 374* 201*   Lipid Profile: No results for input(s): "CHOL", "HDL", "LDLCALC", "TRIG", "CHOLHDL", "LDLDIRECT" in the last 72 hours. Thyroid Function Tests: No results for input(s): "TSH", "T4TOTAL", "FREET4", "T3FREE", "THYROIDAB" in the last  72 hours. Anemia Panel: No results for input(s): "VITAMINB12", "FOLATE", "FERRITIN", "TIBC", "IRON", "RETICCTPCT" in the last 72 hours. Sepsis Labs: No results for input(s): "PROCALCITON", "LATICACIDVEN" in the last 168 hours.  No results found for this or any previous visit (from the past 240 hours).       Radiology Studies: DG Sacrum/Coccyx Result Date: 10/31/2023 CLINICAL DATA:  Fall EXAM: SACRUM AND COCCYX - 2+ VIEW COMPARISON:  MRI 10/09/2022 FINDINGS: Moderate chronic compression fracture at L5. No acute fracture or malalignment. SI joints are patent IMPRESSION: No acute osseous abnormality. Chronic L5 compression fracture. Electronically Signed   By: Jasmine Pang M.D.   On:  10/31/2023 20:48   CT CHEST WO CONTRAST Result Date: 10/31/2023 CLINICAL DATA:  Dyspnea EXAM: CT CHEST WITHOUT CONTRAST TECHNIQUE: Multidetector CT imaging of the chest was performed following the standard protocol without IV contrast. RADIATION DOSE REDUCTION: This exam was performed according to the departmental dose-optimization program which includes automated exposure control, adjustment of the mA and/or kV according to patient size and/or use of iterative reconstruction technique. COMPARISON:  Chest x-ray 10/31/2023 FINDINGS: Cardiovascular: Limited assessment without intravenous contrast. Mild aortic atherosclerosis. No aneurysm. Coronary vascular calcification. Left atrial appendage Watchman device. No significant pericardial effusion. Mediastinum/Nodes: Patent trachea. No thyroid mass. Subcentimeter mediastinal lymph nodes. Esophagus within normal limits. Lungs/Pleura: Small moderate left-sided pleural effusion. Small right-sided pleural effusion. Airspace disease at the lingula and left base. Upper Abdomen: No acute finding. Cirrhotic morphology of the liver. Splenomegaly up to 15 cm. Small moderate ascites in the upper abdomen. Musculoskeletal: Multilevel degenerative changes. No acute osseous abnormality. IMPRESSION: 1. Small to moderate left and small right pleural effusions. Airspace disease at the lingula and left base, possible atelectasis or pneumonia. 2. Cardiomegaly.  No significant pericardial effusion. 3. Cirrhotic morphology of the liver with splenomegaly and small to moderate ascites in the upper abdomen. 4. Aortic atherosclerosis. Aortic Atherosclerosis (ICD10-I70.0). Electronically Signed   By: Jasmine Pang M.D.   On: 10/31/2023 20:44   DG Chest 2 View Result Date: 10/31/2023 CLINICAL DATA:  Shortness of breath EXAM: CHEST - 2 VIEW COMPARISON:  10/31/2023 FINDINGS: Small left pleural effusion and left basilar opacities, likely atelectasis. Mild cardiomegaly. IMPRESSION: Small left  pleural effusion and left basilar opacities, likely atelectasis. Electronically Signed   By: Deatra Robinson M.D.   On: 10/31/2023 19:43   DG Chest 2 View Result Date: 10/31/2023 CLINICAL DATA:  Shortness of breath EXAM: CHEST - 2 VIEW COMPARISON:  01/23/2023 FINDINGS: Lateral view nondiagnostic secondary to technique and patient size. The AP frontal view is also limited by patient's size. Apical lordotic positioning. Patient rotated to the left. Mild cardiomegaly. Limited evaluation for pleural fluid. No pneumothorax. Low lung volumes. Interstitial prominence is at least partially felt to be secondary. No upper lung consolidation. Lung bases poorly evaluated. IMPRESSION: Moderately limited exam as detailed above. Cardiomegaly and low lung volumes. Possible mild interstitial edema. Consider repeat using PA and lateral technique if possible. The patient underwent high quality PA and lateral radiographs of 01/23/2023. Electronically Signed   By: Jeronimo Greaves M.D.   On: 10/31/2023 18:20        Scheduled Meds:  aspirin EC  81 mg Oral Daily   furosemide  40 mg Intravenous Q12H   gabapentin  600 mg Oral BID   heparin  5,000 Units Subcutaneous Q8H   insulin aspart  0-20 Units Subcutaneous TID WC   insulin aspart  0-5 Units Subcutaneous QHS   insulin glargine-yfgn  25  Units Subcutaneous BID   metoprolol succinate  100 mg Oral BID   oxybutynin  5 mg Oral QHS   pantoprazole  40 mg Oral Daily   potassium chloride  10 mEq Oral Daily   Continuous Infusions:   LOS: 1 day    Time spent: 56 minutes spent on chart review, discussion with nursing staff, consultants, updating family and interview/physical exam; more than 50% of that time was spent in counseling and/or coordination of care.    Jennifer Philips [place], DO [facility] Available via Epic secure chat 7am-7pm After these hours, please refer to coverage provider listed on [email or web address] 11/01/2023, 10:33 AM

## 2023-11-01 NOTE — Progress Notes (Signed)
   11/01/23 1400  ReDS Vest / Clip  Station Marker B  Ruler Value 38  ReDS Value Range (!) > 40  ReDS Actual Value 42

## 2023-11-01 NOTE — Progress Notes (Signed)
   11/01/23 2026  Assess: MEWS Score  Temp 97.9 F (36.6 C)  BP (!) 120/58  MAP (mmHg) 76  Pulse Rate (!) 55  Resp (!) 26  SpO2 97 %  O2 Device Room Air  Assess: MEWS Score  MEWS Temp 0  MEWS Systolic 0  MEWS Pulse 0  MEWS RR 2  MEWS LOC 0  MEWS Score 2  MEWS Score Color Yellow  Assess: if the MEWS score is Yellow or Red  Were vital signs accurate and taken at a resting state? Yes  Does the patient meet 2 or more of the SIRS criteria? No  MEWS guidelines implemented  Yes, yellow  Treat  MEWS Interventions Considered administering scheduled or prn medications/treatments as ordered  Take Vital Signs  Increase Vital Sign Frequency  Yellow: Q2hr x1, continue Q4hrs until patient remains green for 12hrs  Escalate  MEWS: Escalate Yellow: Discuss with charge nurse and consider notifying provider and/or RRT  Notify: Charge Nurse/RN  Name of Charge Nurse/RN Notified [facility]  Provider Notification  Provider Name/Title Dr. Thomes Dinning  Date Provider Notified 11/01/23  Time Provider Notified 2030  Method of Notification Page  Notification Reason Other (Comment) (yellow mews)  Assess: SIRS CRITERIA  SIRS Temperature  0  SIRS Respirations  1  SIRS Pulse 0  SIRS WBC 0  SIRS Score Sum  1   Patient presenting with SOB, edema all over. Dr, Thomes Dinning made aware, o2 placed for comfort and ease of breathing. Awaiting orders at this time

## 2023-11-02 ENCOUNTER — Other Ambulatory Visit: Payer: Self-pay

## 2023-11-02 ENCOUNTER — Inpatient Hospital Stay (HOSPITAL_COMMUNITY): Payer: Medicare PPO

## 2023-11-02 DIAGNOSIS — I5031 Acute diastolic (congestive) heart failure: Secondary | ICD-10-CM | POA: Diagnosis not present

## 2023-11-02 DIAGNOSIS — I480 Paroxysmal atrial fibrillation: Secondary | ICD-10-CM | POA: Diagnosis not present

## 2023-11-02 LAB — BASIC METABOLIC PANEL
Anion gap: 9 (ref 5–15)
BUN: 18 mg/dL (ref 8–23)
CO2: 28 mmol/L (ref 22–32)
Calcium: 8.5 mg/dL — ABNORMAL LOW (ref 8.9–10.3)
Chloride: 99 mmol/L (ref 98–111)
Creatinine, Ser: 1 mg/dL (ref 0.44–1.00)
GFR, Estimated: 58 mL/min — ABNORMAL LOW (ref >60)
Glucose, Bld: 114 mg/dL — ABNORMAL HIGH (ref 70–99)
Potassium: 3.6 mmol/L (ref 3.5–5.1)
Sodium: 136 mmol/L (ref 135–145)

## 2023-11-02 LAB — CBC
HCT: 29.9 % — ABNORMAL LOW (ref 36.0–46.0)
Hemoglobin: 8.4 g/dL — ABNORMAL LOW (ref 12.0–15.0)
MCH: 24.3 pg — ABNORMAL LOW (ref 26.0–34.0)
MCHC: 28.1 g/dL — ABNORMAL LOW (ref 30.0–36.0)
MCV: 86.4 fL (ref 80.0–100.0)
Platelets: 172 10*3/uL (ref 150–400)
RBC: 3.46 MIL/uL — ABNORMAL LOW (ref 3.87–5.11)
RDW: 19.7 % — ABNORMAL HIGH (ref 11.5–15.5)
WBC: 6 10*3/uL (ref 4.0–10.5)
nRBC: 0 % (ref 0.0–0.2)

## 2023-11-02 LAB — ECHOCARDIOGRAM COMPLETE
Area-P 1/2: 2.62 cm2
Height: 62 in
S' Lateral: 3.6 cm
Weight: 4074.1 [oz_av]

## 2023-11-02 LAB — GLUCOSE, CAPILLARY
Glucose-Capillary: 118 mg/dL — ABNORMAL HIGH (ref 70–99)
Glucose-Capillary: 163 mg/dL — ABNORMAL HIGH (ref 70–99)
Glucose-Capillary: 166 mg/dL — ABNORMAL HIGH (ref 70–99)
Glucose-Capillary: 168 mg/dL — ABNORMAL HIGH (ref 70–99)
Glucose-Capillary: 193 mg/dL — ABNORMAL HIGH (ref 70–99)

## 2023-11-02 LAB — PROCALCITONIN: Procalcitonin: <0.1 ng/mL

## 2023-11-02 LAB — BRAIN NATRIURETIC PEPTIDE: B Natriuretic Peptide: 274 pg/mL — ABNORMAL HIGH (ref 0.0–100.0)

## 2023-11-02 LAB — MAGNESIUM: Magnesium: 2 mg/dL (ref 1.7–2.4)

## 2023-11-02 MED ORDER — POTASSIUM CHLORIDE CRYS ER 20 MEQ PO TBCR
40.0000 meq | EXTENDED_RELEASE_TABLET | Freq: Once | ORAL | Status: AC
  Administered 2023-11-02: 40 meq via ORAL
  Filled 2023-11-02: qty 2

## 2023-11-02 MED ORDER — PERFLUTREN LIPID MICROSPHERE
1.0000 mL | INTRAVENOUS | Status: AC | PRN
  Administered 2023-11-02: 2 mL via INTRAVENOUS

## 2023-11-02 MED ORDER — METOPROLOL TARTRATE 5 MG/5ML IV SOLN
2.5000 mg | Freq: Once | INTRAVENOUS | Status: DC
Start: 2023-11-03 — End: 2023-11-03
  Filled 2023-11-02: qty 5

## 2023-11-02 NOTE — Progress Notes (Signed)
Patient continues with SOB, bladder scan through the night showed no urine. Patient has only voided once during the night. AM dose of Lasix given.

## 2023-11-02 NOTE — Plan of Care (Signed)

## 2023-11-02 NOTE — Progress Notes (Signed)
   11/02/23 1800  ReDS Vest / Clip  BMI (Calculated) 45.75  Station Marker B  Ruler Value 40  Anatomical Comments  (unable to get it)

## 2023-11-02 NOTE — Progress Notes (Addendum)
PROGRESS NOTE    [name]  ZOX:[id number] DOB: 1945/11/16 DOA: 10/31/2023 PCP: Jeoffrey Massed, MD    Brief Narrative:   SHARION GRIEVES is a 78 y.o. female with past medical history significant for chronic diastolic congestive heart failure, proximal atrial fibrillation not on anticoagulation due to recurrent epistaxis s/p Watchman procedure (12/2022), CAD on aspirin, type 2 diabetes mellitus, anxiety/depression, nonalcoholic cirrhosis, OSA on CPAP, morbid obesity who presented to [facility] ED on 1/31 from home with progressive shortness of breath, lower extremity edema.  Patient apparently called her cardiologist and was advised to come to the ED for further evaluation.  Onset of symptoms 2 days prior to admission.  Denies headache, no chest pain, no abdominal pain, no fever/chills/night sweats, no nausea/vomiting/diarrhea, no focal weakness, no fatigue.  In the ED, temperature 99.4 F, HR 108, RR 16, BP 152/88, SpO2 96% on room air.  WBC 4.4, hemoglobin 9.1, platelet count 164.  Sodium 136, potassium 3.7, chloride 100, CO2 26, glucose 357, BUN 13, creatinine 0.71.  BNP 556.0.  High sensitive troponin 3.  Chest x-ray with cardiomegaly, interstitial edema.  Sacrum/coccyx x-ray with no acute osseous abnormality, chronic L5 compression fracture.  EKG with atrial fibrillation, rate 120, no concerning dynamic changes.  ED EDP started on IV Lasix.  TRH consulted for admission for further evaluation and management.  Assessment & Plan:   Acute on chronic diastolic congestive heart failure Patient presenting to ED with progressive shortness of breath, lower extremity edema over the last 2 days.  Called her cardiologist who directed her to the ED for further evaluation.  Has not increased her home Lasix dose which is 40 mg p.o. daily.  Patient with 2-3+ pitting edema bilateral lower extremities, chest x-ray with cardiomegaly and interstitial edema.  Patient is afebrile without leukocytosis. -- BNP   556>274 -- TTE: Pending -- Lasix 60 mg IV q12h -- Strict I's and O's and daily weights -- Encourage lower extremity elevation while sitting/laying -- TED hose -- Fluid restriction 1800 mL/day -- Monitor renal function closely daily, repeat BNP in am  Bilateral pleural effusion Imaging notable for bilateral pleural effusion, left greater than right. -- IR consulted for ultrasound-guided thoracentesis, plan 2/3.  Atrial fibrillation with RVR On arrival to the ED, EKG notable for A-fib with RVR with rate 120.  Not on chronic anticoagulation due to recurrent epistaxis and underwent Watchman procedure April 2024. -- Metoprolol succinate 100 mg p.o. daily (hold for HR <55) -- Continue monitor on telemetry  CAD -- Aspirin 81 mg p.o. daily -- Not on statin outpatient  Type 2 diabetes mellitus, with hyperglycemia Hemoglobin A1c 8.5 on 05/29/2023, not optimally controlled.  Home regimen includes insulin U-500 100 units [place] qAM and at lunch, Invokana 100 mg p.o. daily, Trulicity 1.5 mg weekly -- Holding home Invokana, Trulicity, U-500 not on formulary inpatient -- Semglee 25u [place] q12h -- Resistant SSI for coverage -- CBG before every meal/at bedtime   Nonalcoholic cirrhosis -- Sodium/fluid restriction  Anxiety/depression: -- Celexa 20 mg p.o. daily  GERD -- Protonix 40 mg p.o. daily (substituted for home Nexium)  OSA on CPAP -- Continue nocturnal CPAP  Morbid obesity, class III Body mass index is 46.57 kg/m.  Complicates all facets of care.  Weakness/debility/deconditioning: Seen by physical therapy this morning with recommendation of home health on discharge. --Continue therapy efforts while inpatient.   DVT prophylaxis: heparin injection 5,000 Units Start: 10/31/23 2200 SCDs Start: 10/31/23 1938    Code Status: Full  Code Family Communication: Updated spouse present at bedside this morning  Disposition Plan:  Level of care: Telemetry Status is: Inpatient Remains  inpatient appropriate because: IV Lasix    Consultants:  None  Procedures:  TTE: Pending  Antimicrobials:  None   Subjective: Patient seen examined bedside, lying in bed.  Spouse present.  Continues to endorse dyspnea.  Remains on room air.  Repeat x-ray overnight showing persistent pleural effusion, left greater than right.  Discussed with patient and spouse plans for thoracentesis, agreeable.  IR consulted and plan for thoracentesis tomorrow.  Continues on IV Lasix.  No other specific complaints, concerns or questions at this time.  Denies headache, no dizziness, no chest pain, no palpitations, no fever/chills/night sweats, no nausea/vomiting/diarrhea, no focal weakness, no cough/congestion, no fatigue, no paresthesia.  No acute events overnight per nursing.  Objective: Vitals:   11/02/23 0026 11/02/23 0231 11/02/23 0531 11/02/23 0842  BP: (!) 150/61 125/73 112/75 (!) 114/99  Pulse: (!) 56 (!) 55 (!) 53 (!) 58  Resp: 20 19 20    Temp: 97.9 F (36.6 C) 97.6 F (36.4 C) 97.6 F (36.4 C)   TempSrc: Oral Oral Oral   SpO2: 95% 97% 96%   Weight:   115.5 kg   Height:        Intake/Output Summary (Last 24 hours) at 11/02/2023 1143 Last data filed at 11/02/2023 0606 Gross per 24 hour  Intake 240 ml  Output 325 ml  Net -85 ml   Filed Weights   10/31/23 1650 11/01/23 0500 11/02/23 0531  Weight: 100 kg 109.8 kg 115.5 kg    Examination:  Physical Exam: GEN: NAD, alert and oriented x 3, obese, chronically ill/elderly in appearance HEENT: NCAT, PERRL, EOMI, sclera clear, MMM PULM: Breath sounds slight diminished lateral bases, noted crackles, no wheezing, normal respiratory effort without accessory muscle use, on room air with SpO2 96% at rest. CV: RRR w/o M/G/R GI: abd soft, NTND, NABS, no R/G/M MSK: 2+ pitting edema bilateral lower extremities to knee, muscle strength globally intact 5/5 bilateral upper/lower extremities NEURO: CN II-XII intact, no focal deficits, sensation to  light touch intact PSYCH: normal mood/affect Integumentary: No concerning rashes/lesions/wounds noted on exposed skin surfaces.    Data Reviewed: I have personally reviewed following labs and imaging studies  CBC: Recent Labs  Lab 10/31/23 1716 11/01/23 0452 11/02/23 0556  WBC 4.4 5.8 6.0  NEUTROABS 2.3  --   --   HGB 9.1* 8.3* 8.4*  HCT 31.1* 28.2* 29.9*  MCV 83.2 84.7 86.4  PLT 164 166 172   Basic Metabolic Panel: Recent Labs  Lab 10/31/23 1716 10/31/23 1937 11/01/23 0452 11/02/23 0556  NA 136  --  136 136  K 3.7  --  4.0 3.6  CL 100  --  100 99  CO2 26  --  30 28  GLUCOSE 357*  --  259* 114*  BUN 13  --  15 18  CREATININE 0.71  --  0.75 1.00  CALCIUM 8.3*  --  8.3* 8.5*  MG  --  1.6*  --  2.0   GFR: Estimated Creatinine Clearance: 57.6 mL/min (by C-G formula based on SCr of 1 mg/dL). Liver Function Tests: No results for input(s): "AST", "ALT", "ALKPHOS", "BILITOT", "PROT", "ALBUMIN" in the last 168 hours. No results for input(s): "LIPASE", "AMYLASE" in the last 168 hours. No results for input(s): "AMMONIA" in the last 168 hours. Coagulation Profile: No results for input(s): "INR", "PROTIME" in the last 168 hours. Cardiac Enzymes:  No results for input(s): "CKTOTAL", "CKMB", "CKMBINDEX", "TROPONINI" in the last 168 hours. BNP (last 3 results) No results for input(s): "PROBNP" in the last 8760 hours. HbA1C: No results for input(s): "HGBA1C" in the last 72 hours. CBG: Recent Labs  Lab 11/01/23 1120 11/01/23 1708 11/01/23 2028 11/02/23 0030 11/02/23 0755  GLUCAP 186* 231* 269* 166* 118*   Lipid Profile: No results for input(s): "CHOL", "HDL", "LDLCALC", "TRIG", "CHOLHDL", "LDLDIRECT" in the last 72 hours. Thyroid Function Tests: No results for input(s): "TSH", "T4TOTAL", "FREET4", "T3FREE", "THYROIDAB" in the last 72 hours. Anemia Panel: No results for input(s): "VITAMINB12", "FOLATE", "FERRITIN", "TIBC", "IRON", "RETICCTPCT" in the last 72  hours. Sepsis Labs: Recent Labs  Lab 11/02/23 0556  PROCALCITON <0.10    No results found for this or any previous visit (from the past 240 hours).       Radiology Studies: DG Chest 1 View Result Date: 11/01/2023 CLINICAL DATA:  Dyspnea EXAM: CHEST  1 VIEW COMPARISON:  10/31/2023 FINDINGS: Small left pleural effusion and associated retrocardiac opacification appears mildly progressive since prior examination in keeping with progressive atelectasis or infiltrate within this region. Right lung is clear. No pneumothorax. No pleural effusion on right. Cardiac size is mildly enlarged, unchanged. Watchman device in place. No acute bone abnormality. IMPRESSION: 1. Mildly progressive small left pleural effusion and associated retrocardiac atelectasis or infiltrate. Electronically Signed   By: Helyn Numbers M.D.   On: 11/01/2023 21:25   DG Sacrum/Coccyx Result Date: 10/31/2023 CLINICAL DATA:  Fall EXAM: SACRUM AND COCCYX - 2+ VIEW COMPARISON:  MRI 10/09/2022 FINDINGS: Moderate chronic compression fracture at L5. No acute fracture or malalignment. SI joints are patent IMPRESSION: No acute osseous abnormality. Chronic L5 compression fracture. Electronically Signed   By: Jasmine Pang M.D.   On: 10/31/2023 20:48   CT CHEST WO CONTRAST Result Date: 10/31/2023 CLINICAL DATA:  Dyspnea EXAM: CT CHEST WITHOUT CONTRAST TECHNIQUE: Multidetector CT imaging of the chest was performed following the standard protocol without IV contrast. RADIATION DOSE REDUCTION: This exam was performed according to the departmental dose-optimization program which includes automated exposure control, adjustment of the mA and/or kV according to patient size and/or use of iterative reconstruction technique. COMPARISON:  Chest x-ray 10/31/2023 FINDINGS: Cardiovascular: Limited assessment without intravenous contrast. Mild aortic atherosclerosis. No aneurysm. Coronary vascular calcification. Left atrial appendage Watchman device. No  significant pericardial effusion. Mediastinum/Nodes: Patent trachea. No thyroid mass. Subcentimeter mediastinal lymph nodes. Esophagus within normal limits. Lungs/Pleura: Small moderate left-sided pleural effusion. Small right-sided pleural effusion. Airspace disease at the lingula and left base. Upper Abdomen: No acute finding. Cirrhotic morphology of the liver. Splenomegaly up to 15 cm. Small moderate ascites in the upper abdomen. Musculoskeletal: Multilevel degenerative changes. No acute osseous abnormality. IMPRESSION: 1. Small to moderate left and small right pleural effusions. Airspace disease at the lingula and left base, possible atelectasis or pneumonia. 2. Cardiomegaly.  No significant pericardial effusion. 3. Cirrhotic morphology of the liver with splenomegaly and small to moderate ascites in the upper abdomen. 4. Aortic atherosclerosis. Aortic Atherosclerosis (ICD10-I70.0). Electronically Signed   By: Jasmine Pang M.D.   On: 10/31/2023 20:44   DG Chest 2 View Result Date: 10/31/2023 CLINICAL DATA:  Shortness of breath EXAM: CHEST - 2 VIEW COMPARISON:  10/31/2023 FINDINGS: Small left pleural effusion and left basilar opacities, likely atelectasis. Mild cardiomegaly. IMPRESSION: Small left pleural effusion and left basilar opacities, likely atelectasis. Electronically Signed   By: Deatra Robinson M.D.   On: 10/31/2023 19:43   DG Chest  2 View Result Date: 10/31/2023 CLINICAL DATA:  Shortness of breath EXAM: CHEST - 2 VIEW COMPARISON:  01/23/2023 FINDINGS: Lateral view nondiagnostic secondary to technique and patient size. The AP frontal view is also limited by patient's size. Apical lordotic positioning. Patient rotated to the left. Mild cardiomegaly. Limited evaluation for pleural fluid. No pneumothorax. Low lung volumes. Interstitial prominence is at least partially felt to be secondary. No upper lung consolidation. Lung bases poorly evaluated. IMPRESSION: Moderately limited exam as detailed above.  Cardiomegaly and low lung volumes. Possible mild interstitial edema. Consider repeat using PA and lateral technique if possible. The patient underwent high quality PA and lateral radiographs of 01/23/2023. Electronically Signed   By: Jeronimo Greaves M.D.   On: 10/31/2023 18:20        Scheduled Meds:  aspirin EC  81 mg Oral Daily   furosemide  60 mg Intravenous Q12H   gabapentin  600 mg Oral BID   heparin  5,000 Units Subcutaneous Q8H   insulin aspart  0-20 Units Subcutaneous TID WC   insulin aspart  0-5 Units Subcutaneous QHS   insulin glargine-yfgn  25 Units Subcutaneous BID   metoprolol succinate  100 mg Oral BID   oxybutynin  5 mg Oral QHS   pantoprazole  40 mg Oral Daily   Continuous Infusions:   LOS: 2 days    Time spent: 56 minutes spent on chart review, discussion with nursing staff, consultants, updating family and interview/physical exam; more than 50% of that time was spent in counseling and/or coordination of care.    Alvira Philips [place], DO [facility] Available via Epic secure chat 7am-7pm After these hours, please refer to coverage provider listed on [email or web address] 11/02/2023, 11:43 AM

## 2023-11-03 ENCOUNTER — Encounter (HOSPITAL_COMMUNITY): Payer: Self-pay | Admitting: Internal Medicine

## 2023-11-03 ENCOUNTER — Inpatient Hospital Stay (HOSPITAL_COMMUNITY): Payer: Medicare PPO

## 2023-11-03 DIAGNOSIS — I5033 Acute on chronic diastolic (congestive) heart failure: Secondary | ICD-10-CM | POA: Diagnosis not present

## 2023-11-03 LAB — LACTATE DEHYDROGENASE, PLEURAL OR PERITONEAL FLUID: LD, Fluid: 62 U/L — ABNORMAL HIGH (ref 3–23)

## 2023-11-03 LAB — BASIC METABOLIC PANEL
Anion gap: 7 (ref 5–15)
BUN: 16 mg/dL (ref 8–23)
CO2: 30 mmol/L (ref 22–32)
Calcium: 8.6 mg/dL — ABNORMAL LOW (ref 8.9–10.3)
Chloride: 100 mmol/L (ref 98–111)
Creatinine, Ser: 0.79 mg/dL (ref 0.44–1.00)
GFR, Estimated: >60 mL/min (ref >60)
Glucose, Bld: 168 mg/dL — ABNORMAL HIGH (ref 70–99)
Potassium: 3.9 mmol/L (ref 3.5–5.1)
Sodium: 137 mmol/L (ref 135–145)

## 2023-11-03 LAB — GRAM STAIN

## 2023-11-03 LAB — GLUCOSE, CAPILLARY
Glucose-Capillary: 187 mg/dL — ABNORMAL HIGH (ref 70–99)
Glucose-Capillary: 93 mg/dL (ref 70–99)
Glucose-Capillary: 174 mg/dL — ABNORMAL HIGH (ref 70–99)
Glucose-Capillary: 146 mg/dL — ABNORMAL HIGH (ref 70–99)
Glucose-Capillary: 155 mg/dL — ABNORMAL HIGH (ref 70–99)

## 2023-11-03 LAB — PROTEIN, PLEURAL OR PERITONEAL FLUID: Total protein, fluid: <3 g/dL

## 2023-11-03 LAB — BODY FLUID CELL COUNT WITH DIFFERENTIAL
Eos, Fluid: 0 %
Lymphs, Fluid: 95 %
Monocyte-Macrophage-Serous Fluid: 5 % — ABNORMAL LOW (ref 50–90)
Neutrophil Count, Fluid: 0 % (ref 0–25)
Total Nucleated Cell Count, Fluid: 837 uL (ref 0–1000)

## 2023-11-03 LAB — MRSA NEXT GEN BY PCR, NASAL: MRSA by PCR Next Gen: NOT DETECTED

## 2023-11-03 LAB — BRAIN NATRIURETIC PEPTIDE: B Natriuretic Peptide: 610 pg/mL — ABNORMAL HIGH (ref 0.0–100.0)

## 2023-11-03 LAB — LACTATE DEHYDROGENASE: LDH: 177 U/L (ref 98–192)

## 2023-11-03 LAB — PROTEIN, TOTAL: Total Protein: 6.1 g/dL — ABNORMAL LOW (ref 6.5–8.1)

## 2023-11-03 LAB — MAGNESIUM: Magnesium: 1.8 mg/dL (ref 1.7–2.4)

## 2023-11-03 MED ORDER — FUROSEMIDE 10 MG/ML IJ SOLN
20.0000 mg | Freq: Once | INTRAMUSCULAR | Status: AC
Start: 2023-11-03 — End: 2023-11-03
  Administered 2023-11-03: 20 mg via INTRAVENOUS
  Filled 2023-11-03: qty 2

## 2023-11-03 MED ORDER — METOPROLOL TARTRATE 5 MG/5ML IV SOLN
2.5000 mg | Freq: Once | INTRAVENOUS | Status: AC
  Administered 2023-11-03: 2.5 mg via INTRAVENOUS

## 2023-11-03 MED ORDER — LIDOCAINE HCL (PF) 2 % IJ SOLN
10.0000 mL | Freq: Once | INTRAMUSCULAR | Status: AC
  Administered 2023-11-03: 10 mL

## 2023-11-03 MED ORDER — DILTIAZEM HCL 25 MG/5ML IV SOLN
5.0000 mg | Freq: Once | INTRAVENOUS | Status: AC
  Administered 2023-11-03: 5 mg via INTRAVENOUS
  Filled 2023-11-03: qty 5

## 2023-11-03 MED ORDER — LIDOCAINE HCL (PF) 2 % IJ SOLN
INTRAMUSCULAR | Status: AC
Start: 2023-11-03 — End: ?
  Filled 2023-11-03: qty 10

## 2023-11-03 MED ORDER — MORPHINE SULFATE (PF) 2 MG/ML IV SOLN
0.5000 mg | Freq: Once | INTRAVENOUS | Status: AC
  Administered 2023-11-03: 0.5 mg via INTRAVENOUS
  Filled 2023-11-03: qty 1

## 2023-11-03 MED ORDER — CHLORHEXIDINE GLUCONATE CLOTH 2 % EX PADS
6.0000 | MEDICATED_PAD | Freq: Every day | CUTANEOUS | Status: DC
  Administered 2023-11-03 – 2023-11-04 (×2): 6 via TOPICAL

## 2023-11-03 NOTE — Telephone Encounter (Signed)
Patient has been seen in the ER 

## 2023-11-03 NOTE — Progress Notes (Signed)
PT Cancellation Note  Patient Details Name: [name] MRN: [id number] DOB: [birth year]   Cancelled Treatment:    Reason Eval/Treat Not Completed: Medical issues which prohibited therapy.  Patient transferred to a higher level of care and will need new PT consult to resume therapy when patient is medically stable.  Thank you.    7:45 AM, 11/03/23 Ocie Bob, MPT Physical Therapist with [facility] 336 [phone] office [phone] mobile phone

## 2023-11-03 NOTE — Progress Notes (Signed)
Patient has been refusing CPAP. 

## 2023-11-03 NOTE — Plan of Care (Signed)

## 2023-11-03 NOTE — Procedures (Signed)
Pre procedural Dx: Symptomatic pleural effusion Post procedural Dx: Same  Successful US guided left sided thoracentesis yielding 500 cc of serous pleural fluid.   Samples sent to lab for analysis.  EBL: None Complications: None immediate.  Katherina Right, MD Pager #: [phone]

## 2023-11-03 NOTE — Progress Notes (Signed)
Patient tolerated left sided Thoracentesis procedure well today and 500 mL of pleural fluid removed with labs collected and sent for processing. Patient verbalized understanding of post procedure instructions and transported via stretcher to xray for post chest xray at this time with no acute distress noted.

## 2023-11-03 NOTE — Progress Notes (Addendum)
Critical care note:  Date of note: 11/02/2023.  Subjective: The patient went into atrial fibrillation with rate 138-140s for 30 minutes she has a history of paroxysmal atrial fibrillation on aspirin.  She was taken off anticoagulants due to recurrent epistaxis in the past.  She was having mild wheezing without dyspnea or cough.  She denied any chest pain or palpitations.  No fever or chills.  She was given Lopressor 100 mg at 2100.  She was sitting in bed playing games on her phone when this happened.  She has a thoracentesis planned for tomorrow.  Nebulizer therapy was given 30 minutes prior to this episode.  BP was 121/62 when heart rate was 137, respiratory rate of 19 and pulse oximetry was 100% on room air with a temperature of 97.8..  Objective: Physical examination: Generally: Acutely ill elderly Caucasian female sitting in bed in no acute respiratory distress. Vital signs per history of present illness. Head - atraumatic, normocephalic.  Pupils - equal, round and reactive to light and accommodation. Extraocular movements are intact. No scleral icterus.  Oropharynx - moist mucous membranes and tongue. No pharyngeal erythema or exudate.  Neck - supple. No JVD. Carotid pulses 2+ bilaterally. No carotid bruits. No palpable thyromegaly or lymphadenopathy. Cardiovascular -irregularly irregular tachycardic rhythm. Normal S1 and S2. No murmurs, gallops or rubs.  Lungs -diminished left basal breath sounds with mild crackles. Abdomen - soft and nontender. Positive bowel sounds. No palpable organomegaly or masses.  Extremities - no pitting edema, clubbing or cyanosis.  Neuro - grossly non-focal. Skin - no rashes. Breast, pelvic and rectal - deferred.   Stat portable chest x-ray showed improved aeration of the left lung base with persistent left basal opacity and small left pleural effusion.  Assessment/plan: 1.  Paroxysmal atrial fibrillation with rapid ventricular response. - The patient was  given 5 mg of IV Cardizem with improvement of the rate to 110 and respiratory rate was 22.  Later on heart rate was 105 and blood pressure 115/75.  He will continue with her p.o. Lopressor.  Will utilize IV Cardizem as needed. - She was transferred to the ICU for potential need for more IV Cardizem or Cardizem drip. - She is not on anticoagulation due to recurrent epistaxis.  She underwent Watchman procedure in April 2024. - We will continue aspirin.  Authorized and performed by: Valente David, MD Total critical care time:   30     minutes. Due to a high probability of clinically significant, life-threatening deterioration, the patient required my highest level of preparedness to intervene emergently and I personally spent this critical care time directly and personally managing the patient.  This critical care time included obtaining a history, examining the patient, pulse oximetry, ordering and review of studies, arranging urgent treatment with development of management plan, evaluation of patient's response to treatment, frequent reassessment, and discussions with other providers. This critical care time was performed to assess and manage the high probability of imminent, life-threatening deterioration that could result in multiorgan failure.  It was exclusive of separately billable procedures and treating other patients and teaching time.

## 2023-11-03 NOTE — Progress Notes (Signed)
PROGRESS NOTE    [name]  ZOX:[id number] DOB: June 14, 1946 DOA: 10/31/2023 PCP: Jeoffrey Massed, MD    Brief Narrative:   [name] is a 78 y.o. 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 ED on 1/31 from home with progressive shortness of breath, lower extremity edema.  Patient apparently called her cardiologist and was advised to come to the ED for further evaluation.  Onset of symptoms 2 days prior to admission.  Denies headache, no chest pain, no abdominal pain, no fever/chills/night sweats, no nausea/vomiting/diarrhea, no focal weakness, no fatigue.  In the ED, temperature 99.4 F, HR 108, RR 16, BP 152/88, SpO2 96% on room air.  WBC 4.4, hemoglobin 9.1, platelet count 164.  Sodium 136, potassium 3.7, chloride 100, CO2 26, glucose 357, BUN 13, creatinine 0.71.  BNP 556.0.  High sensitive troponin 3.  Chest x-ray with cardiomegaly, interstitial edema.  Sacrum/coccyx x-ray with no acute osseous abnormality, chronic L5 compression fracture.  EKG with atrial fibrillation, rate 120, no concerning dynamic changes.  ED EDP started on IV Lasix.  TRH consulted for admission for further evaluation and management.  Assessment & Plan:   Acute on chronic diastolic congestive heart failure Patient presenting to ED with progressive shortness of breath, lower extremity edema over the last 2 days.  Called her cardiologist who directed her to the ED for further evaluation.  Has not increased her home Lasix dose which is 40 mg p.o. daily.  Patient with 2-3+ pitting edema bilateral lower extremities, chest x-ray with cardiomegaly and interstitial edema.  Patient is afebrile without leukocytosis.  TTE  with LVEF 55 to 60%. -- Lasix 60 mg IV q12h -- Strict I's and O's and daily weights -- Encourage lower extremity elevation while sitting/laying -- TED hose -- Fluid restriction 1800 mL/day -- Monitor renal function closely daily, repeat BNP in am  Bilateral pleural effusion Imaging notable for bilateral pleural effusion, left greater than right. -- IR consulted for ultrasound-guided thoracentesis, plan 2/3.  Atrial fibrillation with RVR On arrival to the ED, EKG notable for A-fib with RVR with rate 120.  Not on chronic anticoagulation due to recurrent epistaxis and underwent Watchman procedure April 2024. -- Metoprolol succinate 100 mg p.o. daily (hold for HR <55) -- Continue monitor on telemetry  CAD -- Aspirin 81 mg p.o. daily -- Not on statin outpatient  Type 2 diabetes mellitus, with hyperglycemia Hemoglobin A1c 8.5 on 05/29/2023, not optimally controlled.  Home regimen includes insulin U-500 100 units [place] qAM and at lunch, Invokana 100 mg p.o. daily, Trulicity 1.5 mg weekly -- Holding home Invokana, Trulicity, U-500 not on formulary inpatient -- Semglee 25u [place] q12h -- Resistant SSI for coverage -- CBG before every meal/at bedtime   Nonalcoholic cirrhosis -- Sodium/fluid restriction  Anxiety/depression: -- Celexa 20 mg p.o. daily  GERD -- Protonix 40 mg p.o. daily (substituted for home Nexium)  OSA on CPAP -- Continue nocturnal CPAP  Morbid obesity, class III Body mass index is 44.76 kg/m.  Complicates all facets of care.  Weakness/debility/deconditioning: Seen by physical therapy this morning with recommendation of home health on discharge. -- Continue therapy efforts while inpatient.   DVT prophylaxis: heparin injection 5,000 Units Start: 10/31/23 2200 SCDs Start: 10/31/23 1938    Code Status:  Full Code Family Communication: No family present bedside this morning, updated spouse yesterday afternoon at bedside  Disposition Plan:  Level of care:  Stepdown Status is: Inpatient Remains inpatient appropriate because: IV Lasix, thoracentesis    Consultants:  None  Procedures:  TTE: Pending  Antimicrobials:  None   Subjective: Patient seen examined bedside, lying in bed. Continues to endorse dyspnea; although remains on room air with adequate oxygenation.  Asking when she will be able to go home.  Overnight patient with recurrent A-fib with RVR, s/p IV Cardizem with improvement and transferred to ICU for closer monitoring.  Recurrent event likely secondary to several missed doses of her metoprolol.  No other specific complaints, concerns or questions at this time.  Denies headache, no dizziness, no chest pain, no palpitations, no fever/chills/night sweats, no nausea/vomiting/diarrhea, no focal weakness, no cough/congestion, no fatigue, no paresthesia.  No acute events overnight per nursing.  Objective: Vitals:   11/03/23 0400 11/03/23 0500 11/03/23 0600 11/03/23 0749  BP: 125/68 120/63 125/61   Pulse: (!) 106 98 (!) 102   Resp: (!) 24 (!) 24 (!) 22   Temp:    98.2 F (36.8 C)  TempSrc:    Oral  SpO2: 93% 93% 93%   Weight:      Height:        Intake/Output Summary (Last 24 hours) at 11/03/2023 0907 Last data filed at 11/03/2023 1610 Gross per 24 hour  Intake 450 ml  Output 2550 ml  Net -2100 ml   Filed Weights   11/02/23 0531 11/02/23 1800 11/03/23 0154  Weight: 115.5 kg 113.5 kg 111 kg    Examination:  Physical Exam: GEN: NAD, alert and oriented x 3, obese, chronically ill/elderly in appearance HEENT: NCAT, PERRL, EOMI, sclera clear, MMM PULM: Breath sounds slight diminished lateral bases, no crackles/wheezing, normal respiratory effort without accessory muscle use, on room air with SpO2 96% at rest. CV: RRR w/o M/G/R GI: abd soft, NTND, NABS, no R/G/M MSK: TED hose noted, 2+ pitting edema bilateral lower extremities to knee, muscle strength globally intact 5/5 bilateral upper/lower extremities NEURO: CN II-XII  intact, no focal deficits, sensation to light touch intact PSYCH: normal mood/affect Integumentary: No concerning rashes/lesions/wounds noted on exposed skin surfaces.    Data Reviewed: I have personally reviewed following labs and imaging studies  CBC: Recent Labs  Lab 10/31/23 1716 11/01/23 0452 11/02/23 0556  WBC 4.4 5.8 6.0  NEUTROABS 2.3  --   --   HGB 9.1* 8.3* 8.4*  HCT 31.1* 28.2* 29.9*  MCV 83.2 84.7 86.4  PLT 164 166 172   Basic Metabolic Panel: Recent Labs  Lab 10/31/23 1716 10/31/23 1937 11/01/23 0452 11/02/23 0556 11/03/23 0430  NA 136  --  136 136 137  K 3.7  --  4.0 3.6 3.9  CL 100  --  100 99 100  CO2 26  --  30 28 30   GLUCOSE 357*  --  259* 114* 168*  BUN 13  --  15 18 16   CREATININE 0.71  --  0.75 1.00 0.79  CALCIUM 8.3*  --  8.3* 8.5* 8.6*  MG  --  1.6*  --  2.0 1.8   GFR: Estimated Creatinine Clearance: 70.4 mL/min (by C-G formula based on SCr of 0.79 mg/dL). Liver Function Tests: Recent Labs  Lab 11/03/23 0430  PROT 6.1*   No results for input(s): "LIPASE", "AMYLASE" in the last 168 hours. No results for input(s): "AMMONIA" in the last 168 hours. Coagulation Profile:  No results for input(s): "INR", "PROTIME" in the last 168 hours. Cardiac Enzymes: No results for input(s): "CKTOTAL", "CKMB", "CKMBINDEX", "TROPONINI" in the last 168 hours. BNP (last 3 results) No results for input(s): "PROBNP" in the last 8760 hours. HbA1C: No results for input(s): "HGBA1C" in the last 72 hours. CBG: Recent Labs  Lab 11/02/23 1156 11/02/23 1700 11/02/23 2039 11/03/23 0109 11/03/23 0746  GLUCAP 193* 163* 168* 187* 93   Lipid Profile: No results for input(s): "CHOL", "HDL", "LDLCALC", "TRIG", "CHOLHDL", "LDLDIRECT" in the last 72 hours. Thyroid Function Tests: No results for input(s): "TSH", "T4TOTAL", "FREET4", "T3FREE", "THYROIDAB" in the last 72 hours. Anemia Panel: No results for input(s): "VITAMINB12", "FOLATE", "FERRITIN", "TIBC", "IRON",  "RETICCTPCT" in the last 72 hours. Sepsis Labs: Recent Labs  Lab 11/02/23 0556  PROCALCITON <0.10    No results found for this or any previous visit (from the past 240 hours).       Radiology Studies: DG CHEST PORT 1 VIEW Result Date: 11/03/2023 CLINICAL DATA:  Edema and shortness of breath EXAM: PORTABLE CHEST 1 VIEW COMPARISON:  11/01/2023 FINDINGS: Improved aeration of the left lung base with persistent left basilar opacity. Small left pleural effusion. Right lung is clear. Mild cardiomegaly. Unchanged Watchman device. IMPRESSION: Improved aeration of the left lung base with persistent left basilar opacity and small left pleural effusion. Electronically Signed   By: Deatra Robinson M.D.   On: 11/03/2023 00:25   ECHOCARDIOGRAM COMPLETE Result Date: 11/02/2023    ECHOCARDIOGRAM REPORT   Patient Name:   LANIKA COLGATE Date of Exam: 11/02/2023 Medical Rec #:  [id number]     Height:       62.0 in Accession #:    [id number]    Weight:       254.6 lb Date of Birth:  1945-11-07    BSA:          2.118 m Patient Age:    77 years      BP:           112/75 mmHg Patient Gender: F             HR:           60 bpm. [facility] Procedure: 2D Echo, Color Doppler, Cardiac Doppler and Intracardiac            Opacification Agent Indications:    CHF-Acute Diastolic I50.31  History:        Patient has prior history of Echocardiogram examinations, most                 recent 08/26/2023. Arrythmias:Atrial Fibrillation; Risk                 Factors:Sleep Apnea and Hypertension.  Sonographer:    Darlys Gales Referring Phys: [id number] DAWO[address] S EL[place]  Sonographer Comments: Technically difficult study due to poor echo windows. IMPRESSIONS  1. Technically difficult study with poor echo windows, despite Definity contrast  2. Left ventricular ejection fraction, by estimation, is 55 to 60%. The left ventricle has normal function. Left ventricular endocardial border not optimally defined to evaluate regional wall motion.  Left ventricular diastolic function could not be evaluated.  3. Right ventricular systolic function was not well visualized. The right ventricular size is not well visualized.  4. The mitral valve was not well visualized. Trivial mitral valve regurgitation.  5. The aortic valve was not well visualized. Aortic valve regurgitation is not visualized. Comparison(s): Changes from prior study are noted.  08/26/2023: LVEF 60-65%. FINDINGS  Left Ventricle: Left ventricular ejection fraction, by estimation, is 55 to 60%. The left ventricle has normal function. Left ventricular endocardial border not optimally defined to evaluate regional wall motion. Definity contrast agent was given IV to delineate the left ventricular endocardial borders. The left ventricular internal cavity size was normal in size. There is no left ventricular hypertrophy. Left ventricular diastolic function could not be evaluated due to atrial fibrillation. Left ventricular diastolic function could not be evaluated. Right Ventricle: The right ventricular size is not well visualized. Right vetricular wall thickness was not well visualized. Right ventricular systolic function was not well visualized. Left Atrium: Left atrial size was normal in size. Right Atrium: Right atrial size was normal in size. Pericardium: There is no evidence of pericardial effusion. Mitral Valve: The mitral valve was not well visualized. Trivial mitral valve regurgitation. Tricuspid Valve: The tricuspid valve is not well visualized. Tricuspid valve regurgitation is not demonstrated. Aortic Valve: The aortic valve was not well visualized. Aortic valve regurgitation is not visualized. Pulmonic Valve: The pulmonic valve was not well visualized. Pulmonic valve regurgitation is not visualized. Aorta: The aortic root and ascending aorta are structurally normal, with no evidence of dilitation. IAS/Shunts: The interatrial septum was not well visualized.  LEFT VENTRICLE PLAX 2D LVIDd:          5.20 cm LVIDs:         3.60 cm LV PW:         1.00 cm LV IVS:        1.00 cm LVOT diam:     1.80 cm LVOT Area:     2.54 cm  LEFT ATRIUM           Index LA Vol (A4C): 68.6 ml 32.38 ml/m  MITRAL VALVE MV Area (PHT): 2.62 cm     SHUNTS MV Decel Time: 290 msec     Systemic Diam: 1.80 cm MV E velocity: 105.00 cm/s MV A velocity: 51.40 cm/s MV E/A ratio:  2.04 Zoila Shutter MD Electronically signed by Zoila Shutter MD Signature Date/Time: 11/02/2023/1:57:49 PM    Final    DG Chest 1 View Result Date: 11/01/2023 CLINICAL DATA:  Dyspnea EXAM: CHEST  1 VIEW COMPARISON:  10/31/2023 FINDINGS: Small left pleural effusion and associated retrocardiac opacification appears mildly progressive since prior examination in keeping with progressive atelectasis or infiltrate within this region. Right lung is clear. No pneumothorax. No pleural effusion on right. Cardiac size is mildly enlarged, unchanged. Watchman device in place. No acute bone abnormality. IMPRESSION: 1. Mildly progressive small left pleural effusion and associated retrocardiac atelectasis or infiltrate. Electronically Signed   By: Helyn Numbers M.D.   On: 11/01/2023 21:25        Scheduled Meds:  aspirin EC  81 mg Oral Daily   Chlorhexidine Gluconate Cloth  6 each Topical Daily   furosemide  60 mg Intravenous Q12H   gabapentin  600 mg Oral BID   heparin  5,000 Units Subcutaneous Q8H   insulin aspart  0-20 Units Subcutaneous TID WC   insulin aspart  0-5 Units Subcutaneous QHS   insulin glargine-yfgn  25 Units Subcutaneous BID   metoprolol succinate  100 mg Oral BID   metoprolol tartrate  2.5 mg Intravenous Once   oxybutynin  5 mg Oral QHS   pantoprazole  40 mg Oral Daily   Continuous Infusions:   LOS: 3 days    Time spent: 56 minutes spent on chart review, discussion with nursing staff,  consultants, updating family and interview/physical exam; more than 50% of that time was spent in counseling and/or coordination of care.    Alvira Philips  [place], DO [facility] Available via Epic secure chat 7am-7pm After these hours, please refer to coverage provider listed on [email or web address] 11/03/2023, 9:07 AM

## 2023-11-03 NOTE — Progress Notes (Signed)
   11/02/23 2336  Assess: MEWS Score  Temp 97.7 F (36.5 C)  BP 128/83  Pulse Rate (!) 139  ECG Heart Rate (!) 128  Resp (!) 22  Level of Consciousness Alert  SpO2 96 %  O2 Device Room Air  Patient Activity (if Appropriate) In bed  Assess: MEWS Score  MEWS Temp 0  MEWS Systolic 0  MEWS Pulse 2  MEWS RR 1  MEWS LOC 0  MEWS Score 3  MEWS Score Color Yellow  Assess: if the MEWS score is Yellow or Red  Were vital signs accurate and taken at a resting state? Yes  Does the patient meet 2 or more of the SIRS criteria? Yes  Does the patient have a confirmed or suspected source of infection? No  MEWS guidelines implemented  Yes, yellow  Treat  MEWS Interventions Considered administering scheduled or prn medications/treatments as ordered  Take Vital Signs  Increase Vital Sign Frequency  Yellow: Q2hr x1, continue Q4hrs until patient remains green for 12hrs  Escalate  MEWS: Escalate Yellow: Discuss with charge nurse and consider notifying provider and/or RRT  Notify: Charge Nurse/RN  Name of Charge Nurse/RN Notified Risk manager, RN (Alfonso Ramus, RN present for support)  Provider Notification  Provider Name/Title dr Arville Care  Date Provider Notified 11/03/23  Time Provider Notified 2340  Method of Notification Page (rapid response called)  Notification Reason Change in status (HR sustaining 140s)  Provider response At bedside (came up to see patient after rapid response)  Date of Provider Response 11/03/23  Time of Provider Response 2350  Notify: Rapid Response  Name of Rapid Response RN Notified jordana burchett, RN/AC  Date Rapid Response Notified 11/03/23  Time Rapid Response Notified 2340  Assess: SIRS CRITERIA  SIRS Temperature  0  SIRS Respirations  1  SIRS Pulse 1  SIRS WBC 0  SIRS Score Sum  2   Dr Arville Care notified of rapid response by [facility], came up to see patient and assess. Orders given to primary RN for cardiazem 5 mg IV x 1 and CXR, stated to treat and monitor.

## 2023-11-03 NOTE — Progress Notes (Signed)
   11/03/23 0800  ReDS Vest / Clip  Station Marker B  Ruler Value 42  ReDS Value Range < 36  ReDS Actual Value 28

## 2023-11-04 DIAGNOSIS — I5033 Acute on chronic diastolic (congestive) heart failure: Secondary | ICD-10-CM | POA: Diagnosis not present

## 2023-11-04 LAB — GLUCOSE, CAPILLARY
Glucose-Capillary: 88 mg/dL (ref 70–99)
Glucose-Capillary: 162 mg/dL — ABNORMAL HIGH (ref 70–99)
Glucose-Capillary: 205 mg/dL — ABNORMAL HIGH (ref 70–99)
Glucose-Capillary: 226 mg/dL — ABNORMAL HIGH (ref 70–99)

## 2023-11-04 LAB — BASIC METABOLIC PANEL
Anion gap: 7 (ref 5–15)
BUN: 14 mg/dL (ref 8–23)
CO2: 32 mmol/L (ref 22–32)
Calcium: 8.5 mg/dL — ABNORMAL LOW (ref 8.9–10.3)
Chloride: 99 mmol/L (ref 98–111)
Creatinine, Ser: 0.71 mg/dL (ref 0.44–1.00)
GFR, Estimated: >60 mL/min (ref >60)
Glucose, Bld: 150 mg/dL — ABNORMAL HIGH (ref 70–99)
Potassium: 3.4 mmol/L — ABNORMAL LOW (ref 3.5–5.1)
Sodium: 138 mmol/L (ref 135–145)

## 2023-11-04 LAB — MAGNESIUM: Magnesium: 1.8 mg/dL (ref 1.7–2.4)

## 2023-11-04 LAB — PATHOLOGIST SMEAR REVIEW

## 2023-11-04 LAB — BRAIN NATRIURETIC PEPTIDE: B Natriuretic Peptide: 747 pg/mL — ABNORMAL HIGH (ref 0.0–100.0)

## 2023-11-04 MED ORDER — POTASSIUM CHLORIDE CRYS ER 20 MEQ PO TBCR
30.0000 meq | EXTENDED_RELEASE_TABLET | ORAL | Status: AC
Start: 2023-11-04 — End: 2023-11-04
  Administered 2023-11-04 (×2): 30 meq via ORAL
  Filled 2023-11-04 (×2): qty 1

## 2023-11-04 NOTE — Progress Notes (Signed)
 PROGRESS NOTE    [name]  FMW:[id number] DOB: [birth year] DOA: 10/31/2023 PCP: Candise Aleene DEL, MD    Brief Narrative:   [name] is a 78 y.o. female with past medical history significant for chronic diastolic congestive heart failure, proximal atrial fibrillation not on anticoagulation due to recurrent epistaxis s/p Watchman procedure (12/2022), CAD on aspirin , type 2 diabetes mellitus, anxiety/depression, nonalcoholic cirrhosis, OSA on CPAP, morbid obesity who presented to [facility] ED on 1/31 from home with progressive shortness of breath, lower extremity edema.  Patient apparently called her cardiologist and was advised to come to the ED for further evaluation.  Onset of symptoms 2 days prior to admission.  Denies headache, no chest pain, no abdominal pain, no fever/chills/night sweats, no nausea/vomiting/diarrhea, no focal weakness, no fatigue.  In the ED, temperature 99.4 F, HR 108, RR 16, BP 152/88, SpO2 96% on room air.  WBC 4.4, hemoglobin 9.1, platelet count 164.  Sodium 136, potassium 3.7, chloride 100, CO2 26, glucose 357, BUN 13, creatinine 0.71.  BNP 556.0.  High sensitive troponin 3.  Chest x-ray with cardiomegaly, interstitial edema.  Sacrum/coccyx x-ray with no acute osseous abnormality, chronic L5 compression fracture.  EKG with atrial fibrillation, rate 120, no concerning dynamic changes.  ED EDP started on IV Lasix .  TRH consulted for admission for further evaluation and management.  Assessment & Plan:   Acute on chronic diastolic congestive heart failure Patient presenting to ED with progressive shortness of breath, lower extremity edema over the last 2 days.  Called her cardiologist who directed her to the ED for further evaluation.  Has not increased her home Lasix  dose which is 40 mg p.o. daily.  Patient with 2-3+ pitting edema bilateral lower extremities, chest x-ray with cardiomegaly and interstitial edema.  Patient is afebrile without leukocytosis.  TTE  with LVEF 55 to 60%. -- Lasix  60 mg IV q12h -- Strict I's and O's and daily weights -- Encourage lower extremity elevation while sitting/laying -- TED hose -- Fluid restriction 1800 mL/day -- Monitor renal function closely daily, repeat BNP in am  Bilateral pleural effusion Imaging notable for bilateral pleural effusion, left greater than right.  Interventional radiology was consulted and patient underwent ultrasound-guided left thoracentesis on 11/02/2022 with 500 mL fluid removal. -- Repeat chest x-ray in a.m.  Atrial fibrillation with RVR On arrival to the ED, EKG notable for A-fib with RVR with rate 120.  Not on chronic anticoagulation due to recurrent epistaxis and underwent Watchman procedure April 2024. -- Metoprolol  succinate 100 mg p.o. daily (hold for HR <55) -- Continue monitor on telemetry  CAD -- Aspirin  81 mg p.o. daily -- Not on statin outpatient  Type 2 diabetes mellitus, with hyperglycemia Hemoglobin A1c 8.5 on 05/29/2023, not optimally controlled.  Home regimen includes insulin  U-500 100 units [place] qAM and at lunch, Invokana  100 mg p.o. daily, Trulicity  1.5 mg weekly -- Holding home Invokana , Trulicity , U-500 not on formulary inpatient -- Semglee  25u [place] q12h -- Resistant SSI for coverage -- CBG before every meal/at bedtime   Nonalcoholic cirrhosis -- Sodium/fluid restriction  Anxiety/depression: -- Celexa  20 mg p.o. daily  GERD -- Protonix  40 mg p.o. daily (substituted for home Nexium )  OSA on CPAP -- Continue nocturnal CPAP  Morbid obesity, class III Body mass index is 42.5 kg/m.  Complicates all facets of care.  Weakness/debility/deconditioning: Seen by physical therapy this morning with recommendation of home health on discharge. -- Continue therapy efforts while inpatient.   DVT prophylaxis:  heparin  injection 5,000 Units Start: 10/31/23 2200 SCDs Start: 10/31/23 1938    Code Status: Full Code Family Communication: Updated spouse present at  bedside this morning  Disposition Plan:  Level of care: Telemetry Status is: Inpatient Remains inpatient appropriate because: IV Lasix , potential discharge home tomorrow    Consultants:  None  Procedures:  TTE: Pending  Antimicrobials:  None   Subjective: Patient seen examined bedside, sitting on edge of bed.  Watching TV.  Spouse present.  Dyspnea slowly improving.  Discussed will continue IV Lasix  today, repeat chest x-ray in the morning.  Underwent left thoracentesis with 500 mL of fluid removal.  Discussed will continue IV Lasix  today, repeat chest x-ray in the morning.  Underwent left thoracentesis with 500 mL of fluid removal.  Continues to complain of sacral pain with sitting.   No other specific complaints, concerns or questions at this time.  Denies headache, no dizziness, no chest pain, no palpitations, no fever/chills/night sweats, no nausea/vomiting/diarrhea, no focal weakness, no cough/congestion, no fatigue, no paresthesia.  No acute events overnight per nursing.  Objective: Vitals:   11/03/23 2047 11/03/23 2325 11/04/23 0513 11/04/23 0930  BP: 135/82 123/68 126/67 116/62  Pulse: 84 99 86 100  Resp: (!) 22 (!) 22 20 16   Temp: 98.7 F (37.1 C) 98.5 F (36.9 C) 98.3 F (36.8 C) 98.2 F (36.8 C)  TempSrc: Oral Oral Oral Oral  SpO2: 96% 95% 94% 99%  Weight:   105.4 kg   Height:        Intake/Output Summary (Last 24 hours) at 11/04/2023 1110 Last data filed at 11/04/2023 0900 Gross per 24 hour  Intake 960 ml  Output --  Net 960 ml   Filed Weights   11/02/23 1800 11/03/23 0154 11/04/23 0513  Weight: 113.5 kg 111 kg 105.4 kg    Examination:  Physical Exam: GEN: NAD, alert and oriented x 3, obese, chronically ill/elderly in appearance HEENT: NCAT, PERRL, EOMI, sclera clear, MMM PULM: Breath sounds slight diminished lateral bases, no crackles/wheezing, normal respiratory effort without accessory muscle use, on room air with SpO2 96% at rest. CV: RRR w/o  M/G/R GI: abd soft, NTND, NABS, no R/G/M MSK: TED hose noted, 2+ pitting edema bilateral lower extremities to knee, muscle strength globally intact 5/5 bilateral upper/lower extremities NEURO: CN II-XII intact, no focal deficits, sensation to light touch intact PSYCH: normal mood/affect Integumentary: No concerning rashes/lesions/wounds noted on exposed skin surfaces.    Data Reviewed: I have personally reviewed following labs and imaging studies  CBC: Recent Labs  Lab 10/31/23 1716 11/01/23 0452 11/02/23 0556  WBC 4.4 5.8 6.0  NEUTROABS 2.3  --   --   HGB 9.1* 8.3* 8.4*  HCT 31.1* 28.2* 29.9*  MCV 83.2 84.7 86.4  PLT 164 166 172   Basic Metabolic Panel: Recent Labs  Lab 10/31/23 1716 10/31/23 1937 11/01/23 0452 11/02/23 0556 11/03/23 0430 11/04/23 0345  NA 136  --  136 136 137 138  K 3.7  --  4.0 3.6 3.9 3.4*  CL 100  --  100 99 100 99  CO2 26  --  30 28 30  32  GLUCOSE 357*  --  259* 114* 168* 150*  BUN 13  --  15 18 16 14   CREATININE 0.71  --  0.75 1.00 0.79 0.71  CALCIUM  8.3*  --  8.3* 8.5* 8.6* 8.5*  MG  --  1.6*  --  2.0 1.8 1.8   GFR: Estimated Creatinine Clearance: 68.2 mL/min (by  C-G formula based on SCr of 0.71 mg/dL). Liver Function Tests: Recent Labs  Lab 11/03/23 0430  PROT 6.1*   No results for input(s): LIPASE, AMYLASE in the last 168 hours. No results for input(s): AMMONIA in the last 168 hours. Coagulation Profile: No results for input(s): INR, PROTIME in the last 168 hours. Cardiac Enzymes: No results for input(s): CKTOTAL, CKMB, CKMBINDEX, TROPONINI in the last 168 hours. BNP (last 3 results) No results for input(s): PROBNP in the last 8760 hours. HbA1C: No results for input(s): HGBA1C in the last 72 hours. CBG: Recent Labs  Lab 11/03/23 0746 11/03/23 1221 11/03/23 1611 11/03/23 2050 11/04/23 0731  GLUCAP 93 174* 146* 155* 88   Lipid Profile: No results for input(s): CHOL, HDL, LDLCALC, TRIG,  CHOLHDL, LDLDIRECT in the last 72 hours. Thyroid  Function Tests: No results for input(s): TSH, T4TOTAL, FREET4, T3FREE, THYROIDAB in the last 72 hours. Anemia Panel: No results for input(s): VITAMINB12, FOLATE, FERRITIN, TIBC, IRON , RETICCTPCT in the last 72 hours. Sepsis Labs: Recent Labs  Lab 11/02/23 0556  PROCALCITON <0.10    Recent Results (from the past 240 hours)  MRSA Next Gen by PCR, Nasal     Status: None   Collection Time: 11/03/23  1:51 AM   Specimen: Nasal Mucosa; Nasal Swab  Result Value Ref Range Status   MRSA by PCR Next Gen NOT DETECTED NOT DETECTED Final    Comment: (NOTE) The GeneXpert MRSA Assay (FDA approved for NASAL specimens only), is one component of a comprehensive MRSA colonization surveillance program. It is not intended to diagnose MRSA infection nor to guide or monitor treatment for MRSA infections. Test performance is not FDA approved in patients less than 98 years old. Performed at [facility], [address] [place], [place] [address]   Gram stain     Status: None   Collection Time: 11/03/23  3:05 PM   Specimen: Pleura  Result Value Ref Range Status   Specimen Description PLEURAL  Final   Special Requests NONE  Final   Gram Stain   Final    WBC PRESENT, PREDOMINANTLY MONONUCLEAR NO ORGANISMS SEEN CYTOSPIN SMEAR Performed at [facility], [address] [place], [place] [address]    Report Status 11/03/2023 FINAL  Final  Culture, body fluid w Gram Stain-bottle     Status: None (Preliminary result)   Collection Time: 11/03/23  3:05 PM   Specimen: Pleura  Result Value Ref Range Status   Specimen Description PLEURAL  Final   Special Requests BOTTLES DRAWN AEROBIC AND ANAEROBIC 10CC  Final   Culture   Final    NO GROWTH < 24 HOURS Performed at [facility], [address] [place], [place] [address]    Report Status PENDING  Incomplete         Radiology Studies: US  THORACENTESIS ASP PLEURAL SPACE W/IMG  GUIDE Result Date: 11/03/2023 INDICATION: Symptomatic left-sided pleural effusion EXAM: US  THORACENTESIS ASP PLEURAL SPACE W/IMG GUIDE COMPARISON:  Chest radiograph-earlier same day; 11/01/2023 MEDICATIONS: None. COMPLICATIONS: None immediate. TECHNIQUE: Informed written consent was obtained from the patient after a discussion of the risks, benefits and alternatives to treatment. A timeout was performed prior to the initiation of the procedure. Initial ultrasound scanning demonstrates a small left-sided pleural effusion. The lower chest was prepped and draped in the usual sterile fashion. 1% lidocaine  was used for local anesthesia. An ultrasound image was saved for documentation purposes. An 8 Fr Safe-T-Centesis catheter was introduced. The thoracentesis was performed. The catheter was removed and  a dressing was applied. The patient tolerated the procedure well without immediate post procedural complication. The patient was escorted to have an upright chest radiograph. FINDINGS: A total of approximately 500 cc of serous fluid was removed. Requested samples were sent to the laboratory. IMPRESSION: Successful ultrasound-guided left sided thoracentesis yielding 500 cc of pleural fluid. Electronically Signed   By: Norleen Roulette M.D.   On: 11/03/2023 15:31   DG Chest 1 View Result Date: 11/03/2023 CLINICAL DATA:  Post left-sided thoracentesis EXAM: CHEST  1 VIEW COMPARISON:  Earlier same day; 11/01/2023 FINDINGS: Examination is degraded due to patient body habitus. Unchanged enlarged cardiac silhouette and mediastinal contours with atherosclerotic plaque within the aortic arch. Interval reduction/resolution of left-sided pleural effusion post thoracentesis with improved aeration of the left lung base. No pneumothorax. Redemonstrated bilateral infrahilar heterogeneous opacities favored represent atelectasis. No right-sided pleural effusion. No evidence of edema. No acute osseous abnormalities. IMPRESSION: Interval  reduction/resolution of left-sided pleural effusion post thoracentesis without pneumothorax. Electronically Signed   By: Norleen Roulette M.D.   On: 11/03/2023 15:29   DG CHEST PORT 1 VIEW Result Date: 11/03/2023 CLINICAL DATA:  Edema and shortness of breath EXAM: PORTABLE CHEST 1 VIEW COMPARISON:  11/01/2023 FINDINGS: Improved aeration of the left lung base with persistent left basilar opacity. Small left pleural effusion. Right lung is clear. Mild cardiomegaly. Unchanged Watchman device. IMPRESSION: Improved aeration of the left lung base with persistent left basilar opacity and small left pleural effusion. Electronically Signed   By: Franky Stanford M.D.   On: 11/03/2023 00:25        Scheduled Meds:  aspirin  EC  81 mg Oral Daily   Chlorhexidine  Gluconate Cloth  6 each Topical Daily   furosemide   60 mg Intravenous Q12H   gabapentin   600 mg Oral BID   heparin   5,000 Units Subcutaneous Q8H   insulin  aspart  0-20 Units Subcutaneous TID WC   insulin  aspart  0-5 Units Subcutaneous QHS   insulin  glargine-yfgn  25 Units Subcutaneous BID   metoprolol  succinate  100 mg Oral BID   oxybutynin   5 mg Oral QHS   pantoprazole   40 mg Oral Daily   potassium chloride   30 mEq Oral Q3H   Continuous Infusions:   LOS: 4 days    Time spent: 56 minutes spent on chart review, discussion with nursing staff, consultants, updating family and interview/physical exam; more than 50% of that time was spent in counseling and/or coordination of care.    Camellia PARAS Hazyl Marseille, DO [facility] Available via Epic secure chat 7am-7pm After these hours, please refer to coverage provider listed on [email or web address] 11/04/2023, 11:10 AM

## 2023-11-04 NOTE — Plan of Care (Signed)

## 2023-11-04 NOTE — Progress Notes (Signed)
 Physical Therapy Treatment Patient Details Name: [name] MRN: [id number] DOB: 16-Sep-1946 Today's Date: 11/04/2023   History of Present Illness [name]  is a 78 y.o. female, with past medical history of chronic diastolic CHF, paroxysmal A-fib, recurrent epistaxis on anticoagulation, status post Watchman procedure April 2024, off Xarelto  and Plavix , only on aspirin  for CAD(calcium  score), diabetes mellitus, type II, insulin -dependent, morbid obesity, nonalcoholic cirrhosis, OSA on CPAP.  -Patient presents to ED secondary to progressive dyspnea, she reports fluid retention, worsening lower extremity edema, husband at bedside assist with history as well, patient reports dyspnea has been worsening over few days, patient has been sleeping on her recliner over the last 2 weeks, reports progressive lower extremity edema, orthopnea as well, reports she has been compliant with fluid restriction, salt restriction and her medication including Lasix .  -In ED her workup significant for glucose 357, BNP elevated at 556, negative troponins, EKG with prolonged QTc, chest x-ray with volume overload (vascular congestion and pulmonary edema), he was started on IV Lasix  and [facility] consulted to admit    PT Comments  Reassessment:  Patient presents seated at EOB and agreeable for therapy.  Patient demonstrates increased endurance/distance for gait training with mostly step-to pattern using quad-cane without loss of balance.  Patient limited mostly due to c/o fatigue tolerated sitting up at bedside after therapy.  Patient will benefit from continued skilled physical therapy in hospital and recommended venue below to increase strength, balance, endurance for safe ADLs and gait.    If plan is discharge home, recommend the following: A little help with walking and/or transfers;A little help with bathing/dressing/bathroom;Help with stairs or ramp for entrance;Assistance with cooking/housework   Can travel by  private vehicle        Equipment Recommendations  None recommended by PT    Recommendations for Other Services       Precautions / Restrictions Precautions Precautions: Fall Restrictions Weight Bearing Restrictions Per Provider Order: No     Mobility  Bed Mobility Overal bed mobility: Modified Independent                  Transfers Overall transfer level: Needs assistance Equipment used: Quad cane Transfers: Bed to chair/wheelchair/BSC, Sit to/from Stand Sit to Stand: Supervision   Step pivot transfers: Supervision       General transfer comment: slightly labored movement    Ambulation/Gait Ambulation/Gait assistance: Supervision Gait Distance (Feet): 60 Feet Assistive device: Quad cane Gait Pattern/deviations: Decreased step length - right, Decreased stride length, Step-to pattern, Decreased step length - left Gait velocity: decreased     General Gait Details: slightly labored cadence with increased endurance for gait training with mostly step-to pattern using Quad-cane without loss of balance   Stairs             Wheelchair Mobility     Tilt Bed    Modified Rankin (Stroke Patients Only)       Balance Overall balance assessment: Needs assistance Sitting-balance support: Feet supported, No upper extremity supported Sitting balance-Leahy Scale: Good Sitting balance - Comments: seated at EOB   Standing balance support: During functional activity, Single extremity supported Standing balance-Leahy Scale: Fair Standing balance comment: fair/good using quad-cane                            Cognition Arousal: Alert Behavior During Therapy: WFL for tasks assessed/performed Overall Cognitive Status: Within Functional Limits for tasks assessed  Exercises      General Comments        Pertinent Vitals/Pain Pain Assessment Pain Assessment: No/denies pain    Home  Living                          Prior Function            PT Goals (current goals can now be found in the care plan section) Acute Rehab PT Goals Patient Stated Goal: return home PT Goal Formulation: With patient/family Time For Goal Achievement: 11/15/23 Potential to Achieve Goals: Good Progress towards PT goals: Progressing toward goals    Frequency    Min 2X/week      PT Plan      Co-evaluation              AM-PAC PT 6 Clicks Mobility   Outcome Measure  Help needed turning from your back to your side while in a flat bed without using bedrails?: None Help needed moving from lying on your back to sitting on the side of a flat bed without using bedrails?: None Help needed moving to and from a bed to a chair (including a wheelchair)?: A Little Help needed standing up from a chair using your arms (e.g., wheelchair or bedside chair)?: A Little Help needed to walk in hospital room?: A Little Help needed climbing 3-5 steps with a railing? : A Little 6 Click Score: 20    End of Session   Activity Tolerance: Patient tolerated treatment well;Patient limited by fatigue Patient left: in bed;with call bell/phone within reach Nurse Communication: Mobility status PT Visit Diagnosis: Unsteadiness on feet (R26.81);Other abnormalities of gait and mobility (R26.89);Muscle weakness (generalized) (M62.81)     Time: 8887-8875 PT Time Calculation (min) (ACUTE ONLY): 12 min  Charges:    $Therapeutic Activity: 8-22 mins PT General Charges $$ ACUTE PT VISIT: 1 Visit                     12:32 PM, 11/04/23 Lynwood Music, MPT Physical Therapist with [facility] [phone] office [phone] mobile phone

## 2023-11-04 NOTE — Progress Notes (Signed)
SATURATION QUALIFICATIONS: (This note is used to comply with regulatory documentation for home oxygen)  Patient Saturations on Room Air at Rest = 96%  Patient Saturations on Room Air while Ambulating = 98%   

## 2023-11-05 ENCOUNTER — Inpatient Hospital Stay (HOSPITAL_COMMUNITY): Payer: Medicare PPO

## 2023-11-05 DIAGNOSIS — I5033 Acute on chronic diastolic (congestive) heart failure: Secondary | ICD-10-CM | POA: Diagnosis not present

## 2023-11-05 LAB — BASIC METABOLIC PANEL
Anion gap: 7 (ref 5–15)
BUN: 14 mg/dL (ref 8–23)
CO2: 31 mmol/L (ref 22–32)
Calcium: 8.5 mg/dL — ABNORMAL LOW (ref 8.9–10.3)
Chloride: 99 mmol/L (ref 98–111)
Creatinine, Ser: 0.66 mg/dL (ref 0.44–1.00)
GFR, Estimated: >60 mL/min (ref >60)
Glucose, Bld: 66 mg/dL — ABNORMAL LOW (ref 70–99)
Potassium: 3.9 mmol/L (ref 3.5–5.1)
Sodium: 137 mmol/L (ref 135–145)

## 2023-11-05 LAB — GLUCOSE, CAPILLARY
Glucose-Capillary: 103 mg/dL — ABNORMAL HIGH (ref 70–99)
Glucose-Capillary: 154 mg/dL — ABNORMAL HIGH (ref 70–99)

## 2023-11-05 LAB — BRAIN NATRIURETIC PEPTIDE: B Natriuretic Peptide: 537 pg/mL — ABNORMAL HIGH (ref 0.0–100.0)

## 2023-11-05 MED ORDER — FUROSEMIDE 40 MG PO TABS: 60.0000 mg | ORAL_TABLET | Freq: Two times a day (BID) | ORAL | Status: DC

## 2023-11-05 MED ORDER — FUROSEMIDE 20 MG PO TABS: 60.0000 mg | ORAL_TABLET | Freq: Two times a day (BID) | ORAL | 0 refills | Status: DC

## 2023-11-05 NOTE — Inpatient Diabetes Management (Signed)
 Inpatient Diabetes Program Recommendations  AACE/ADA: New Consensus Statement on Inpatient Glycemic Control   Target Ranges:  Prepandial:   less than 140 mg/dL      Peak postprandial:   less than 180 mg/dL (1-2 hours)      Critically ill patients:  140 - 180 mg/dL    Latest Reference Range & Units 11/05/23 03:54  Glucose 70 - 99 mg/dL 66 (L)    Latest Reference Range & Units 11/04/23 07:31 11/04/23 11:09 11/04/23 16:16 11/04/23 21:47  Glucose-Capillary 70 - 99 mg/dL 88 837 (H) 794 (H) 773 (H)   Review of Glycemic Control  Diabetes history: DM2 Outpatient Diabetes medications: Humulin  R U500 100 units with breakfast and lunch, Invokana  100 mg QAM, Trulicity  1.5 mg Qweek Current orders for Inpatient glycemic control: Semglee  25 units BID, Novolog  0-20 units TID with meals, Novolog  0-5 units QHS  Inpatient Diabetes Program Recommendations:    Insulin : Lab glucose 66 mg/dl at 6:45 am today. Please consider changing Semglee  to 25 units QAM and Semglee  22 units at bedtime and ordering Novolog  2 units TID with meals for meal coverage if patient eats at least 50% of meals.  Thanks, Earnie Gainer, RN, MSN, CDCES Diabetes Coordinator Inpatient Diabetes Program [phone] (Team Pager from 8am to 5pm)

## 2023-11-05 NOTE — Discharge Summary (Signed)
 Physician Discharge Summary  [name] FMW:[id number] DOB: [birth year] DOA: 10/31/2023  PCP: Candise Aleene DEL, MD  Admit date: 10/31/2023 Discharge date: 11/05/2023  Admitted From: Home Disposition: Home with home health  Recommendations for Outpatient Follow-up:  Follow up with PCP in 1-2 weeks Follow-up with cardiology, Dr. Pietro in 2 weeks Increased home furosemide  to 60 mg p.o. twice daily Please obtain BMP in one week to reassess renal function Follow-up finalized pleural fluid culture which was pending at time of discharge, showing no growth x 2 days.  Home Health: PT/OT Equipment/Devices: None  Discharge Condition: Stable CODE STATUS: Full code Diet recommendation: Heart healthy/consistent carbohydrate diet with 1800 mL fluid restriction  History of present illness:  [name] is a 78 y.o. female with past medical history significant for chronic diastolic congestive heart failure, proximal atrial fibrillation not on anticoagulation due to recurrent epistaxis s/p Watchman procedure (12/2022), CAD on aspirin , type 2 diabetes mellitus, anxiety/depression, nonalcoholic cirrhosis, OSA on CPAP, morbid obesity who presented to [facility] ED on 1/31 from home with progressive shortness of breath, lower extremity edema.  Patient apparently called her cardiologist and was advised to come to the ED for further evaluation.  Onset of symptoms 2 days prior to admission.  Denies headache, no chest pain, no abdominal pain, no fever/chills/night sweats, no nausea/vomiting/diarrhea, no focal weakness, no fatigue.   In the ED, temperature 99.4 F, HR 108, RR 16, BP 152/88, SpO2 96% on room air.  WBC 4.4, hemoglobin 9.1, platelet count 164.  Sodium 136, potassium 3.7, chloride 100, CO2 26, glucose 357, BUN 13, creatinine 0.71.  BNP 556.0.  High sensitive troponin 3.  Chest x-ray with cardiomegaly, interstitial edema.  Sacrum/coccyx x-ray with no acute osseous abnormality, chronic L5  compression fracture.  EKG with atrial fibrillation, rate 120, no concerning dynamic changes.  ED EDP started on IV Lasix .  TRH consulted for admission for further evaluation and management.  Hospital course:  Acute on chronic diastolic congestive heart failure Patient presenting to ED with progressive shortness of breath, lower extremity edema over the last 2 days.  Called her cardiologist who directed her to the ED for further evaluation.  Has not increased her home Lasix  dose which is 40 mg p.o. daily.  Patient with 2-3+ pitting edema bilateral lower extremities, chest x-ray with cardiomegaly and interstitial edema.  Patient is afebrile without leukocytosis.  TTE with LVEF 55 to 60%.  Patient was started on furosemide  uptitrated to 60 mg IV every 12 hours with improvement of dyspnea.  Will discharge on increased home dose of furosemide  60 mg p.o. twice daily, recommend TED hose, fluid restriction 1800 mL/day.  Recommend repeat BMP 1 week.  Outpatient follow-up with cardiology 2 weeks.  Encouraged to monitor her daily weights and bring weight log to next PCP/cardiology visit.   Bilateral pleural effusion Imaging notable for bilateral pleural effusion, left greater than right.  Interventional radiology was consulted and patient underwent ultrasound-guided left thoracentesis on 11/02/2022 with 500 mL fluid removal.  Pleural fluid culture with no organisms on Gram stain, no growth x 2 days on culture.   Atrial fibrillation with RVR On arrival to the ED, EKG notable for A-fib with RVR with rate 120.  Not on chronic anticoagulation due to recurrent epistaxis and underwent Watchman procedure April 2024. Metoprolol  succinate 100 mg p.o. BID   CAD Aspirin  81 mg p.o. daily, Not on statin outpatient   Type 2 diabetes mellitus, with hyperglycemia Hemoglobin A1c 8.5 on 05/29/2023, not  optimally controlled.  Home regimen includes insulin  U-500 100 units [place] qAM and at lunch, Invokana  100 mg p.o. daily, Trulicity   1.5 mg weekly.  Outpatient follow-up with PCP.   Nonalcoholic cirrhosis Sodium/fluid restriction   Anxiety/depression: Celexa  20 mg p.o. daily   GERD Continue Nexium    OSA on CPAP Continue nocturnal CPAP   Morbid obesity, class III Body mass index is 42.5 kg/m.  Complicates all facets of care.   Weakness/debility/deconditioning: Seen by physical therapy this morning with recommendation of home health on discharge.  Discharge Diagnoses:  Principal Problem:   Acute on chronic diastolic CHF (congestive heart failure) (HCC) Active Problems:   Essential hypertension   Dyslipidemia   PAF (paroxysmal atrial fibrillation) (HCC)   OSA (obstructive sleep apnea), severe   Cirrhosis, nonalcoholic (HCC)   Diabetes mellitus (HCC)   Atrial fibrillation [facility])    Discharge Instructions  Discharge Instructions     Call MD for:  difficulty breathing, headache or visual disturbances   Complete by: As directed    Call MD for:  extreme fatigue   Complete by: As directed    Call MD for:  persistant dizziness or light-headedness   Complete by: As directed    Call MD for:  persistant nausea and vomiting   Complete by: As directed    Call MD for:  severe uncontrolled pain   Complete by: As directed    Call MD for:  temperature >100.4   Complete by: As directed    Diet - low sodium heart healthy   Complete by: As directed    Increase activity slowly   Complete by: As directed       Allergies as of 11/05/2023       Reactions   Flagyl  [metronidazole  Hcl] Other (See Comments)   DIAPHORESIS   Omeprazole  Anaphylaxis, Swelling   SWELLING OF TONGUE AND THROAT   Pioglitazone Other (See Comments), Swelling   Weight gain, tongue swelling   Benzocaine-menthol Swelling   SWELLING OF MOUTH   Metformin  And Related Diarrhea   Shrimp [shellfish Allergy] Itching   OF THROAT AND EARS, if consumed raw   Statins Palpitations   Welchol [colesevelam] Other (See Comments)   GI UPSET   Allevyn  Adhesive [wound Dressings]    Other reaction(s): Unknown   Jardiance  [empagliflozin ] Other (See Comments)   weakness   Lactose Diarrhea, Other (See Comments)   Gas, bloating   Lactose Intolerance (gi) Diarrhea   Gas, bloating   Adhesive [tape] Other (See Comments)   SKIN IRRITATION AND BRUISING   Desipramine  Hcl Itching, Nausea Only, Other (See Comments)   swimmy headed, ears itched   Hydromorphone  Itching   Nisoldipine Itching   Percocet [oxycodone -acetaminophen ] Itching        Medication List     STOP taking these medications    amoxicillin  500 MG capsule Commonly known as: AMOXIL    dicyclomine  10 MG capsule Commonly known as: BENTYL    methocarbamol  500 MG tablet Commonly known as: ROBAXIN        TAKE these medications    Accu-Chek Guide test strip Generic drug: glucose blood USE TO CHECK BLOOD GLUCOSE UP TO 6 TIMES DAILY AS DIRECTED   Accu-Chek Softclix Lancets lancets USE TO CHECK BLOOD GLUCOSE UP TO 6 TIMES DAILY AS DIRECTED   acetaminophen  500 MG tablet Commonly known as: TYLENOL  Take 1,000 mg by mouth every 6 (six) hours as needed for mild pain (pain score 1-3).   aspirin  EC 81 MG tablet Take 1 tablet (  81 mg total) by mouth daily. Swallow whole.   B-D UF III MINI PEN NEEDLES 31G X 5 MM Misc Generic drug: Insulin  Pen Needle USE TO INJECT INSULINS EQUAL TO 6 TIMES DAILY.   blood glucose meter kit and supplies Kit Use up to six times daily as directed. DX. E11.9   canagliflozin  100 MG Tabs tablet Commonly known as: Invokana  Take 1 tablet (100 mg total) by mouth daily before breakfast.   citalopram  20 MG tablet Commonly known as: CELEXA  Take 1 tablet (20 mg total) by mouth daily.   diphenoxylate -atropine  2.5-0.025 MG tablet Commonly known as: LOMOTIL  Take 1 tablet by mouth 4 (four) times daily as needed for diarrhea or loose stools.   esomeprazole  40 MG capsule Commonly known as: NexIUM  Take 1 capsule (40 mg total) by mouth daily before  breakfast.   furosemide  20 MG tablet Commonly known as: LASIX  Take 3 tablets (60 mg total) by mouth 2 (two) times daily. What changed:  medication strength how much to take when to take this   gabapentin  600 MG tablet Commonly known as: NEURONTIN  TAKE 1 AND 1/2 TABLETS BY MOUTH TWICE A DAY   HumuLIN  R U-500 KwikPen 500 UNIT/ML KwikPen Generic drug: insulin  regular human CONCENTRATED Inject 100 units into the skin at breakfast and lunch.   HYDROcodone -acetaminophen  5-325 MG tablet Commonly known as: NORCO/VICODIN 1-2 tabs po bid prn pain What changed:  how much to take how to take this when to take this reasons to take this   INFLIXIMAB  IV Inject into the vein every 8 (eight) weeks. Remicaid 5mg /kg Infusion every 8 weeks ( [facility])   lactase 3000 units tablet Commonly known as: LACTAID Take 3,000 Units by mouth as needed (when eating foods containing dairy).   meclizine  25 MG tablet Commonly known as: ANTIVERT  Take 1 tablet (25 mg total) by mouth 3 (three) times daily as needed for dizziness or nausea.   metoprolol  succinate 100 MG 24 hr tablet Commonly known as: TOPROL -XL TAKE 1 TABLET BY MOUTH 2 TIMES DAILY. TAKE WITH OR IMMEDIATELY FOLLOWING A MEAL   oxybutynin  5 MG 24 hr tablet Commonly known as: Ditropan  XL Take 1 tablet (5 mg total) by mouth at bedtime.   potassium chloride  10 MEQ tablet Commonly known as: Klor-Con  M10 Take 1 tablet (10 mEq total) by mouth daily. What changed: how much to take   Trulicity  1.5 MG/0.5ML Soaj Generic drug: Dulaglutide  INJECT 1.5MG  ONCE WEEKLY        Follow-up Information     [facility] Follow up.   Specialty: Home Health Services Why: Hedda will follow up with you at discharge to provide home health physical and occupational therapy. Contact information: [address] [place] [address] [phone]         Candise Aleene DEL, MD. Schedule an appointment as soon as  possible for a visit in 1 week(s).   Specialty: Family Medicine Contact information: [address] [place] [address] [phone]         Pietro Redell RAMAN, MD. Schedule an appointment as soon as possible for a visit in 2 week(s).   Specialty: Cardiology Contact information: [address] [place] [address] [phone]                Allergies  Allergen Reactions   Flagyl  [Metronidazole  Hcl] Other (See Comments)    DIAPHORESIS   Omeprazole  Anaphylaxis and Swelling    SWELLING OF TONGUE AND  THROAT   Pioglitazone Other (See Comments) and Swelling    Weight gain, tongue swelling   Benzocaine-Menthol Swelling    SWELLING OF MOUTH   Metformin  And Related Diarrhea   Shrimp [Shellfish Allergy] Itching    OF THROAT AND EARS, if consumed raw   Statins Palpitations   Welchol [Colesevelam] Other (See Comments)    GI UPSET   Allevyn Adhesive [Wound Dressings]     Other reaction(s): Unknown   Jardiance  [Empagliflozin ] Other (See Comments)    weakness   Lactose Diarrhea and Other (See Comments)    Gas, bloating   Lactose Intolerance (Gi) Diarrhea    Gas, bloating   Adhesive [Tape] Other (See Comments)    SKIN IRRITATION AND BRUISING   Desipramine  Hcl Itching, Nausea Only and Other (See Comments)    swimmy headed, ears itched   Hydromorphone  Itching   Nisoldipine Itching   Percocet [Oxycodone -Acetaminophen ] Itching    Consultations: None   Procedures/Studies: DG CHEST PORT 1 VIEW Result Date: 11/05/2023 CLINICAL DATA:  Shortness of breath. EXAM: PORTABLE CHEST 1 VIEW COMPARISON:  11/03/2023 FINDINGS: Low volume film. The cardio pericardial silhouette is enlarged. There is pulmonary vascular congestion without overt pulmonary edema. Basilar atelectasis again noted with possible small layering bilateral effusions. No acute bony abnormality. Telemetry leads overlie the chest. IMPRESSION: Low volume film with pulmonary vascular congestion.  Electronically Signed   By: Camellia Candle M.D.   On: 11/05/2023 08:10   US  THORACENTESIS ASP PLEURAL SPACE W/IMG GUIDE Result Date: 11/03/2023 INDICATION: Symptomatic left-sided pleural effusion EXAM: US  THORACENTESIS ASP PLEURAL SPACE W/IMG GUIDE COMPARISON:  Chest radiograph-earlier same day; 11/01/2023 MEDICATIONS: None. COMPLICATIONS: None immediate. TECHNIQUE: Informed written consent was obtained from the patient after a discussion of the risks, benefits and alternatives to treatment. A timeout was performed prior to the initiation of the procedure. Initial ultrasound scanning demonstrates a small left-sided pleural effusion. The lower chest was prepped and draped in the usual sterile fashion. 1% lidocaine  was used for local anesthesia. An ultrasound image was saved for documentation purposes. An 8 Fr Safe-T-Centesis catheter was introduced. The thoracentesis was performed. The catheter was removed and a dressing was applied. The patient tolerated the procedure well without immediate post procedural complication. The patient was escorted to have an upright chest radiograph. FINDINGS: A total of approximately 500 cc of serous fluid was removed. Requested samples were sent to the laboratory. IMPRESSION: Successful ultrasound-guided left sided thoracentesis yielding 500 cc of pleural fluid. Electronically Signed   By: Norleen Roulette M.D.   On: 11/03/2023 15:31   DG Chest 1 View Result Date: 11/03/2023 CLINICAL DATA:  Post left-sided thoracentesis EXAM: CHEST  1 VIEW COMPARISON:  Earlier same day; 11/01/2023 FINDINGS: Examination is degraded due to patient body habitus. Unchanged enlarged cardiac silhouette and mediastinal contours with atherosclerotic plaque within the aortic arch. Interval reduction/resolution of left-sided pleural effusion post thoracentesis with improved aeration of the left lung base. No pneumothorax. Redemonstrated bilateral infrahilar heterogeneous opacities favored represent atelectasis.  No right-sided pleural effusion. No evidence of edema. No acute osseous abnormalities. IMPRESSION: Interval reduction/resolution of left-sided pleural effusion post thoracentesis without pneumothorax. Electronically Signed   By: Norleen Roulette M.D.   On: 11/03/2023 15:29   DG CHEST PORT 1 VIEW Result Date: 11/03/2023 CLINICAL DATA:  Edema and shortness of breath EXAM: PORTABLE CHEST 1 VIEW COMPARISON:  11/01/2023 FINDINGS: Improved aeration of the left lung base with persistent left basilar opacity. Small left pleural effusion. Right lung is clear. Mild cardiomegaly.  Unchanged Watchman device. IMPRESSION: Improved aeration of the left lung base with persistent left basilar opacity and small left pleural effusion. Electronically Signed   By: Franky Stanford M.D.   On: 11/03/2023 00:25   ECHOCARDIOGRAM COMPLETE Result Date: 11/02/2023    ECHOCARDIOGRAM REPORT   Patient Name:   [name] Date of Exam: 11/02/2023 Medical Rec #:  [id number]     Height:       62.0 in Accession #:    [id number]    Weight:       254.6 lb Date of Birth:  22-Jul-1946    BSA:          2.118 m Patient Age:    77 years      BP:           112/75 mmHg Patient Gender: F             HR:           60 bpm. Exam Location:  [facility] Procedure: 2D Echo, Color Doppler, Cardiac Doppler and Intracardiac            Opacification Agent Indications:    CHF-Acute Diastolic I50.31  History:        Patient has prior history of Echocardiogram examinations, most                 recent 08/26/2023. Arrythmias:Atrial Fibrillation; Risk                 Factors:Sleep Apnea and Hypertension.  Sonographer:    Jayson Gaskins Referring Phys: [id number] [place]  Sonographer Comments: Technically difficult study due to poor echo windows. IMPRESSIONS  1. Technically difficult study with poor echo windows, despite Definity  contrast  2. Left ventricular ejection fraction, by estimation, is 55 to 60%. The left ventricle has normal function. Left ventricular endocardial  border not optimally defined to evaluate regional wall motion. Left ventricular diastolic function could not be evaluated.  3. Right ventricular systolic function was not well visualized. The right ventricular size is not well visualized.  4. The mitral valve was not well visualized. Trivial mitral valve regurgitation.  5. The aortic valve was not well visualized. Aortic valve regurgitation is not visualized. Comparison(s): Changes from prior study are noted. 08/26/2023: LVEF 60-65%. FINDINGS  Left Ventricle: Left ventricular ejection fraction, by estimation, is 55 to 60%. The left ventricle has normal function. Left ventricular endocardial border not optimally defined to evaluate regional wall motion. Definity  contrast agent was given IV to delineate the left ventricular endocardial borders. The left ventricular internal cavity size was normal in size. There is no left ventricular hypertrophy. Left ventricular diastolic function could not be evaluated due to atrial fibrillation. Left ventricular diastolic function could not be evaluated. Right Ventricle: The right ventricular size is not well visualized. Right vetricular wall thickness was not well visualized. Right ventricular systolic function was not well visualized. Left Atrium: Left atrial size was normal in size. Right Atrium: Right atrial size was normal in size. Pericardium: There is no evidence of pericardial effusion. Mitral Valve: The mitral valve was not well visualized. Trivial mitral valve regurgitation. Tricuspid Valve: The tricuspid valve is not well visualized. Tricuspid valve regurgitation is not demonstrated. Aortic Valve: The aortic valve was not well visualized. Aortic valve regurgitation is not visualized. Pulmonic Valve: The pulmonic valve was not well visualized. Pulmonic valve regurgitation is not visualized. Aorta: The aortic root and ascending aorta are structurally normal, with no evidence of dilitation. IAS/Shunts:  The interatrial  septum was not well visualized.  LEFT VENTRICLE PLAX 2D LVIDd:         5.20 cm LVIDs:         3.60 cm LV PW:         1.00 cm LV IVS:        1.00 cm LVOT diam:     1.80 cm LVOT Area:     2.54 cm  LEFT ATRIUM           Index LA Vol (A4C): 68.6 ml 32.38 ml/m  MITRAL VALVE MV Area (PHT): 2.62 cm     SHUNTS MV Decel Time: 290 msec     Systemic Diam: 1.80 cm MV E velocity: 105.00 cm/s MV A velocity: 51.40 cm/s MV E/A ratio:  2.04 Vinie Maxcy MD Electronically signed by Vinie Maxcy MD Signature Date/Time: 11/02/2023/1:57:49 PM    Final    DG Chest 1 View Result Date: 11/01/2023 CLINICAL DATA:  Dyspnea EXAM: CHEST  1 VIEW COMPARISON:  10/31/2023 FINDINGS: Small left pleural effusion and associated retrocardiac opacification appears mildly progressive since prior examination in keeping with progressive atelectasis or infiltrate within this region. Right lung is clear. No pneumothorax. No pleural effusion on right. Cardiac size is mildly enlarged, unchanged. Watchman device in place. No acute bone abnormality. IMPRESSION: 1. Mildly progressive small left pleural effusion and associated retrocardiac atelectasis or infiltrate. Electronically Signed   By: Dorethia Molt M.D.   On: 11/01/2023 21:25   DG Sacrum/Coccyx Result Date: 10/31/2023 CLINICAL DATA:  Fall EXAM: SACRUM AND COCCYX - 2+ VIEW COMPARISON:  MRI 10/09/2022 FINDINGS: Moderate chronic compression fracture at L5. No acute fracture or malalignment. SI joints are patent IMPRESSION: No acute osseous abnormality. Chronic L5 compression fracture. Electronically Signed   By: Luke Bun M.D.   On: 10/31/2023 20:48   CT CHEST WO CONTRAST Result Date: 10/31/2023 CLINICAL DATA:  Dyspnea EXAM: CT CHEST WITHOUT CONTRAST TECHNIQUE: Multidetector CT imaging of the chest was performed following the standard protocol without IV contrast. RADIATION DOSE REDUCTION: This exam was performed according to the departmental dose-optimization program which includes automated  exposure control, adjustment of the mA and/or kV according to patient size and/or use of iterative reconstruction technique. COMPARISON:  Chest x-ray 10/31/2023 FINDINGS: Cardiovascular: Limited assessment without intravenous contrast. Mild aortic atherosclerosis. No aneurysm. Coronary vascular calcification. Left atrial appendage Watchman device. No significant pericardial effusion. Mediastinum/Nodes: Patent trachea. No thyroid  mass. Subcentimeter mediastinal lymph nodes. Esophagus within normal limits. Lungs/Pleura: Small moderate left-sided pleural effusion. Small right-sided pleural effusion. Airspace disease at the lingula and left base. Upper Abdomen: No acute finding. Cirrhotic morphology of the liver. Splenomegaly up to 15 cm. Small moderate ascites in the upper abdomen. Musculoskeletal: Multilevel degenerative changes. No acute osseous abnormality. IMPRESSION: 1. Small to moderate left and small right pleural effusions. Airspace disease at the lingula and left base, possible atelectasis or pneumonia. 2. Cardiomegaly.  No significant pericardial effusion. 3. Cirrhotic morphology of the liver with splenomegaly and small to moderate ascites in the upper abdomen. 4. Aortic atherosclerosis. Aortic Atherosclerosis (ICD10-I70.0). Electronically Signed   By: Luke Bun M.D.   On: 10/31/2023 20:44   DG Chest 2 View Result Date: 10/31/2023 CLINICAL DATA:  Shortness of breath EXAM: CHEST - 2 VIEW COMPARISON:  10/31/2023 FINDINGS: Small left pleural effusion and left basilar opacities, likely atelectasis. Mild cardiomegaly. IMPRESSION: Small left pleural effusion and left basilar opacities, likely atelectasis. Electronically Signed   By: Franky Stanford M.D.   On:  10/31/2023 19:43   DG Chest 2 View Result Date: 10/31/2023 CLINICAL DATA:  Shortness of breath EXAM: CHEST - 2 VIEW COMPARISON:  01/23/2023 FINDINGS: Lateral view nondiagnostic secondary to technique and patient size. The AP frontal view is also  limited by patient's size. Apical lordotic positioning. Patient rotated to the left. Mild cardiomegaly. Limited evaluation for pleural fluid. No pneumothorax. Low lung volumes. Interstitial prominence is at least partially felt to be secondary. No upper lung consolidation. Lung bases poorly evaluated. IMPRESSION: Moderately limited exam as detailed above. Cardiomegaly and low lung volumes. Possible mild interstitial edema. Consider repeat using PA and lateral technique if possible. The patient underwent high quality PA and lateral radiographs of 01/23/2023. Electronically Signed   By: Rockey Kilts M.D.   On: 10/31/2023 18:20     Subjective: Patient seen examined bedside, resting calmly.  Sitting edge of bed eating breakfast.  Dyspnea much improved.  Ready for discharge home.  Discussed with patient we will be increasing her home furosemide  to 60 mg p.o. twice daily.  Also needs close follow-up with her cardiologist, Dr. Pietro over the next few weeks.  Encouraged to maintain weight log to bring to PCP/cardiology visit.  No other specific questions, concerns or complaints at this time.  Denies headache, no dizziness, no chest pain, no palpitations, no shortness of breath, no abdominal pain, no fever/chills/night sweats, no nausea/vomiting/diarrhea, no focal weakness, no fatigue, no paresthesia.  No acute events overnight per nursing staff.  Discharge Exam: Vitals:   11/04/23 2107 11/05/23 0449  BP: 128/68 134/81  Pulse: 93 90  Resp: (!) 21 (!) 21  Temp: 98.1 F (36.7 C) 97.9 F (36.6 C)  SpO2: 95% 93%   Vitals:   11/04/23 0930 11/04/23 1315 11/04/23 2107 11/05/23 0449  BP: 116/62 136/88 128/68 134/81  Pulse: 100 89 93 90  Resp: 16 18 (!) 21 (!) 21  Temp: 98.2 F (36.8 C) 97.8 F (36.6 C) 98.1 F (36.7 C) 97.9 F (36.6 C)  TempSrc: Oral Oral Oral Oral  SpO2: 99% 95% 95% 93%  Weight:    103.4 kg  Height:        Physical Exam: GEN: NAD, alert and oriented x 3, obese, chronically  ill/elderly in appearance HEENT: NCAT, PERRL, EOMI, sclera clear, MMM PULM: Breath sounds slight diminished lateral bases, no crackles/wheezing, normal respiratory effort without accessory muscle use, on room air with SpO2 93% CV: RRR w/o M/G/R GI: abd soft, NTND, NABS, no R/G/M MSK: TED hose noted, 2+ pitting edema bilateral lower extremities to knee, muscle strength globally intact 5/5 bilateral upper/lower extremities NEURO: CN II-XII intact, no focal deficits, sensation to light touch intact PSYCH: normal mood/affect Integumentary: No concerning rashes/lesions/wounds noted on exposed skin surfaces.    The results of significant diagnostics from this hospitalization (including imaging, microbiology, ancillary and laboratory) are listed below for reference.     Microbiology: Recent Results (from the past 240 hours)  MRSA Next Gen by PCR, Nasal     Status: None   Collection Time: 11/03/23  1:51 AM   Specimen: Nasal Mucosa; Nasal Swab  Result Value Ref Range Status   MRSA by PCR Next Gen NOT DETECTED NOT DETECTED Final    Comment: (NOTE) The GeneXpert MRSA Assay (FDA approved for NASAL specimens only), is one component of a comprehensive MRSA colonization surveillance program. It is not intended to diagnose MRSA infection nor to guide or monitor treatment for MRSA infections. Test performance is not FDA approved in patients less than  83 years old. Performed at [facility], [address] [place], [place] [address]   Gram stain     Status: None   Collection Time: 11/03/23  3:05 PM   Specimen: Pleura  Result Value Ref Range Status   Specimen Description PLEURAL  Final   Special Requests NONE  Final   Gram Stain   Final    WBC PRESENT, PREDOMINANTLY MONONUCLEAR NO ORGANISMS SEEN CYTOSPIN SMEAR Performed at [facility], [address], [place], [place] [address]    Report Status 11/03/2023 FINAL  Final  Culture, body fluid w Gram Stain-bottle     Status: None (Preliminary  result)   Collection Time: 11/03/23  3:05 PM   Specimen: Pleura  Result Value Ref Range Status   Specimen Description PLEURAL  Final   Special Requests BOTTLES DRAWN AEROBIC AND ANAEROBIC 10CC  Final   Culture   Final    NO GROWTH 2 DAYS Performed at [facility], [address], [place], [place] [address]    Report Status PENDING  Incomplete     Labs: BNP (last 3 results) Recent Labs    11/03/23 0430 11/04/23 0345 11/05/23 0354  BNP 610.0* 747.0* 537.0*   Basic Metabolic Panel: Recent Labs  Lab 10/31/23 1937 11/01/23 0452 11/02/23 0556 11/03/23 0430 11/04/23 0345 11/05/23 0354  NA  --  136 136 137 138 137  K  --  4.0 3.6 3.9 3.4* 3.9  CL  --  100 99 100 99 99  CO2  --  30 28 30  32 31  GLUCOSE  --  259* 114* 168* 150* 66*  BUN  --  15 18 16 14 14   CREATININE  --  0.75 1.00 0.79 0.71 0.66  CALCIUM   --  8.3* 8.5* 8.6* 8.5* 8.5*  MG 1.6*  --  2.0 1.8 1.8  --    Liver Function Tests: Recent Labs  Lab 11/03/23 0430  PROT 6.1*   No results for input(s): LIPASE, AMYLASE in the last 168 hours. No results for input(s): AMMONIA in the last 168 hours. CBC: Recent Labs  Lab 10/31/23 1716 11/01/23 0452 11/02/23 0556  WBC 4.4 5.8 6.0  NEUTROABS 2.3  --   --   HGB 9.1* 8.3* 8.4*  HCT 31.1* 28.2* 29.9*  MCV 83.2 84.7 86.4  PLT 164 166 172   Cardiac Enzymes: No results for input(s): CKTOTAL, CKMB, CKMBINDEX, TROPONINI in the last 168 hours. BNP: Invalid input(s): POCBNP CBG: Recent Labs  Lab 11/04/23 0731 11/04/23 1109 11/04/23 1616 11/04/23 2147 11/05/23 0719  GLUCAP 88 162* 205* 226* 103*   D-Dimer No results for input(s): DDIMER in the last 72 hours. Hgb A1c No results for input(s): HGBA1C in the last 72 hours. Lipid Profile No results for input(s): CHOL, HDL, LDLCALC, TRIG, CHOLHDL, LDLDIRECT in the last 72 hours. Thyroid  function studies No results for input(s): TSH, T4TOTAL, T3FREE, THYROIDAB in the last 72  hours.  Invalid input(s): FREET3 Anemia work up No results for input(s): VITAMINB12, FOLATE, FERRITIN, TIBC, IRON , RETICCTPCT in the last 72 hours. Urinalysis    Component Value Date/Time   COLORURINE YELLOW 08/08/2023 1628   APPEARANCEUR CLEAR 08/08/2023 1628   LABSPEC 1.022 08/08/2023 1628   PHURINE < OR = 5.0 08/08/2023 1628   GLUCOSEU 3+ (A) 08/08/2023 1628   GLUCOSEU >=1000 (A) 07/22/2023 1148   HGBUR 1+ (A) 08/08/2023 1628   BILIRUBINUR Negative 09/17/2023 1349   KETONESUR NEGATIVE 08/08/2023 1628   PROTEINUR Positive (A) 09/17/2023 1349  PROTEINUR NEGATIVE 08/08/2023 1628   UROBILINOGEN 0.2 09/17/2023 1349   UROBILINOGEN 2.0 (A) 07/22/2023 1148   NITRITE Negative 09/17/2023 1349   NITRITE NEGATIVE 08/08/2023 1628   LEUKOCYTESUR Negative 09/17/2023 1349   LEUKOCYTESUR NEGATIVE 08/08/2023 1628   Sepsis Labs Recent Labs  Lab 10/31/23 1716 11/01/23 0452 11/02/23 0556  WBC 4.4 5.8 6.0   Microbiology Recent Results (from the past 240 hours)  MRSA Next Gen by PCR, Nasal     Status: None   Collection Time: 11/03/23  1:51 AM   Specimen: Nasal Mucosa; Nasal Swab  Result Value Ref Range Status   MRSA by PCR Next Gen NOT DETECTED NOT DETECTED Final    Comment: (NOTE) The GeneXpert MRSA Assay (FDA approved for NASAL specimens only), is one component of a comprehensive MRSA colonization surveillance program. It is not intended to diagnose MRSA infection nor to guide or monitor treatment for MRSA infections. Test performance is not FDA approved in patients less than 60 years old. Performed at [facility], [address], [place], [place] [address]   Gram stain     Status: None   Collection Time: 11/03/23  3:05 PM   Specimen: Pleura  Result Value Ref Range Status   Specimen Description PLEURAL  Final   Special Requests NONE  Final   Gram Stain   Final    WBC PRESENT, PREDOMINANTLY MONONUCLEAR NO ORGANISMS SEEN CYTOSPIN SMEAR Performed at [facility], [address] [place], [place] [address]    Report Status 11/03/2023 FINAL  Final  Culture, body fluid w Gram Stain-bottle     Status: None (Preliminary result)   Collection Time: 11/03/23  3:05 PM   Specimen: Pleura  Result Value Ref Range Status   Specimen Description PLEURAL  Final   Special Requests BOTTLES DRAWN AEROBIC AND ANAEROBIC 10CC  Final   Culture   Final    NO GROWTH 2 DAYS Performed at [facility], [address], [place], [place] [address]    Report Status PENDING  Incomplete     Time coordinating discharge: Over 30 minutes  SIGNED:   Camellia PARAS Talecia Sherlin, DO  [facility] 11/05/2023, 8:17 AM

## 2023-11-05 NOTE — Care Management Important Message (Signed)
 Important Message  Patient Details  Name: YENESIS EVEN MRN: [id number] Date of Birth: [birth year]   Important Message Given:  Yes - Medicare IM     Almer Littleton L Christifer Chapdelaine 11/05/2023, 10:57 AM

## 2023-11-05 NOTE — Plan of Care (Signed)

## 2023-11-05 NOTE — TOC Transition Note (Signed)
 Transition of Care [facility]) - Discharge Note   Patient Details  Name: [name] MRN: [id number] Date of Birth: [birth year]  Transition of Care [facility]) CM/SW Contact:  Lucie Lunger, LCSWA Phone Number: 11/05/2023, 9:41 AM   Clinical Narrative:    CSW updated that pt is medically stable for D/C home today. CSW updated Darleene with [facility] of plan for D/C home. HH orders placed. TOC signing off.   Final next level of care: Home w Home Health Services Barriers to Discharge: No Barriers Identified   Patient Goals and CMS Choice Patient states their goals for this hospitalization and ongoing recovery are:: return home CMS Medicare.gov Compare Post Acute Care list provided to:: Patient Choice offered to / list presented to : Patient [place] ownership interest in [facility].provided to::  (NA)    Discharge Placement                       Discharge Plan and Services Additional resources added to the After Visit Summary for   In-house Referral: Clinical Social Work Discharge Planning Services: NA Post Acute Care Choice: Home Health          DME Arranged: N/A DME Agency: NA       HH Arranged: PT, OT HH Agency: [facility] Date [facility] Agency Contacted: 11/05/23 Time HH Agency Contacted: 1351 Representative spoke with at [facility] Agency: Darleene  Social Drivers of Health (SDOH) Interventions SDOH Screenings   Food Insecurity: No Food Insecurity (10/31/2023)  Housing: Low Risk  (10/31/2023)  Transportation Needs: No Transportation Needs (10/31/2023)  Utilities: Not At Risk (10/31/2023)  Alcohol Screen: Low Risk  (11/29/2020)  Depression (PHQ2-9): Low Risk  (01/15/2023)  Financial Resource Strain: Low Risk  (01/15/2023)  Physical Activity: Inactive (01/15/2023)  Social Connections: Moderately Isolated (10/31/2023)  Stress: No Stress Concern Present (01/15/2023)  Tobacco Use: Low Risk  (11/03/2023)     Readmission Risk Interventions    11/01/2023    1:49 PM   Readmission Risk Prevention Plan  Transportation Screening Complete  PCP or Specialist Appt within 5-7 Days Complete  Home Care Screening Complete  Medication Review (RN CM) Complete

## 2023-11-06 ENCOUNTER — Telehealth: Payer: Self-pay

## 2023-11-06 NOTE — Transitions of Care (Post Inpatient/ED Visit) (Signed)
   11/06/2023  Name: [name] MRN: [id number] DOB: Feb 16, 1946  Today's TOC FU Call Status: Today's TOC FU Call Status:: Unsuccessful Call (1st Attempt) Unsuccessful Call (1st Attempt) Date: 11/06/23  Attempted to reach the patient regarding the most recent Inpatient/ED visit.  Follow Up Plan: Additional outreach attempts will be made to reach the patient to complete the Transitions of Care (Post Inpatient/ED visit) call.   Alan Ee, RN, BSN, CEN [employer]- Transition of Care Team.  [facility] [phone]

## 2023-11-07 ENCOUNTER — Telehealth: Payer: Self-pay

## 2023-11-07 ENCOUNTER — Other Ambulatory Visit: Payer: Self-pay | Admitting: Family Medicine

## 2023-11-07 DIAGNOSIS — E1140 Type 2 diabetes mellitus with diabetic neuropathy, unspecified: Secondary | ICD-10-CM

## 2023-11-07 NOTE — Transitions of Care (Post Inpatient/ED Visit) (Signed)
 11/07/2023  Name: [name] MRN: [id number] DOB: December 08, 1945  Today's TOC FU Call Status: Today's TOC FU Call Status:: Successful TOC FU Call Completed Unsuccessful Call (1st Attempt) Date: 11/06/23 Unsuccessful Call (2nd Attempt) Date: 11/07/23 [facility] FU Call Complete Date: 11/07/23 Patient's Name and Date of Birth confirmed.  Transition Care Management Follow-up Telephone Call Date of Discharge: 11/05/23 [facility] Type of Discharge: Inpatient Admission Primary Inpatient Discharge Diagnosis:: Acute on Chronic Heart Failure How have you been since you were released from the hospital?: Same (reports short of breath at night when laying flat otherwise denies shortness of breath and decreased swelling) Any questions or concerns?: No  Items Reviewed: Did you receive and understand the discharge instructions provided?: Yes (Patient reports she has discharge instructions but had not reviewed them. Reviewed d/c instructions during call) Medications obtained,verified, and reconciled?: Yes (Medications Reviewed) Any new allergies since your discharge?: No Dietary orders reviewed?: Yes (Discussed prescribed Heart Healthy carb consistent diet with fluid restriction - Educated patient on proper fluid restrictions and how to measure. Educated 3.5 bottles including coffee, juice, soft drink, water , ice, popsicles) Type of Diet Ordered:: Heart Healthy carb 1800 fluid restriction Do you have support at home?: Yes People in Home: spouse Name of Support/Comfort Primary Source: husband  Medications Reviewed Today: Medications Reviewed Today     Reviewed by Lauro Shona LABOR, RN (Registered Nurse) on 11/07/23 at 1343  Med List Status: <None>   Medication Order Taking? Sig Documenting Provider Last Dose Status Informant  Accu-Chek Softclix Lancets lancets [id number] Yes USE TO CHECK BLOOD GLUCOSE UP TO 6 TIMES DAILY AS DIRECTED McGowen, Aleene DEL, MD Taking Active Self   acetaminophen  (TYLENOL ) 500 MG tablet [id number] Yes Take 1,000 mg by mouth every 6 (six) hours as needed for mild pain (pain score 1-3). [provider] Taking Active Self  aspirin  EC 81 MG tablet [id number] Yes Take 1 tablet (81 mg total) by mouth daily. Swallow whole. Arvil Poe D, NP Taking Active Self  blood glucose meter kit and supplies KIT [id number] Yes Use up to six times daily as directed. DX. E11.9 Candise Aleene DEL, MD Taking Active Self    Discontinued 12/05/11 1452 (Completed Course)   canagliflozin  (INVOKANA ) 100 MG TABS tablet [id number] Yes Take 1 tablet (100 mg total) by mouth daily before breakfast. McGowen, Aleene DEL, MD Taking Active Self  citalopram  (CELEXA ) 20 MG tablet [id number] Yes Take 1 tablet (20 mg total) by mouth daily. McGowen, Philip H, MD Taking Active Self  diphenoxylate -atropine  (LOMOTIL ) 2.5-0.025 MG tablet [id number] No Take 1 tablet by mouth 4 (four) times daily as needed for diarrhea or loose stools.  Patient not taking: Reported on 11/07/2023   Eartha Angelia Sieving, MD Not Taking Active Self  Dulaglutide  (TRULICITY ) 1.5 MG/0.5ML EMMANUEL [id number] Yes INJECT 1.5 MG (0.5ML) UNDER THE SKIN ONCE A WEEK McGowen, Aleene DEL, MD Taking Active   esomeprazole  (NEXIUM ) 40 MG capsule [id number] Yes Take 1 capsule (40 mg total) by mouth daily before breakfast. Eartha Angelia, Sieving, MD Taking Active Self  furosemide  (LASIX ) 20 MG tablet [id number] Yes Take 3 tablets (60 mg total) by mouth 2 (two) times daily. [place], Camellia PARAS, DO Taking Active            Med Note ANNIE, [place] A   Fri Nov 07, 2023  1:03 PM) Patient states she took two 40mg  tablets this morning instead of the prescribed 60mg  - Educated on discharge instructions stating 60mg  twice a  day and is waiting on pharmacy to fill the 20mg  dose. Educated can cut the 40mg  in half and take 1.5 tabs or if she gets the 20mg  tab she would take 3 of the 20mg  tablets - educated page 6 of d/c instruction  gabapentin   (NEURONTIN ) 600 MG tablet [id number] Yes TAKE 1 AND 1/2 TABLETS BY MOUTH TWICE A DAY McGowen, Aleene DEL, MD Taking Active Self  glucose blood (ACCU-CHEK GUIDE) test strip [id number] Yes USE TO CHECK BLOOD GLUCOSE UP TO 6 TIMES DAILY AS DIRECTED McGowen, Aleene DEL, MD Taking Active Self  HYDROcodone -acetaminophen  (NORCO/VICODIN) 5-325 MG tablet [id number] No 1-2 tabs po bid prn pain  Patient not taking: Reported on 11/07/2023   McGowen, Philip H, MD Not Taking Active Self  INFLIXIMAB  IV [id number] No Inject into the vein every 8 (eight) weeks. Remicaid 5mg /kg Infusion every 8 weeks [facility])  Patient not taking: Reported on 11/07/2023   [provider] Not Taking Active Self           Med Note (WARD, CHUCK MATSU   Fri Oct 31, 2023  7:45 PM) Last dose December  Insulin  Pen Needle (B-D UF III MINI PEN NEEDLES) 31G X 5 MM MISC [id number] Yes USE TO INJECT INSULINS EQUAL TO 6 TIMES DAILY. McGowen, Aleene DEL, MD Taking Active Self  insulin  regular human CONCENTRATED (HUMULIN  R U-500 KWIKPEN) 500 UNIT/ML KwikPen [id number] Yes Inject 100 units into the skin at breakfast and lunch. Candise Aleene DEL, MD Taking Active Self           Med Note ANNIE, SOU[place] A   Fri Nov 07, 2023  1:07 PM) Patient states she normally takes insulin  3x/day - educated page 7 of discharge instructions states breakfast and lunch - Patient reports bgm was 57 this morning. Educated to eat something  lactase (LACTAID) 3000 UNITS tablet [id number] Yes Take 3,000 Units by mouth as needed (when eating foods containing dairy).  [provider] Taking Active Self  meclizine  (ANTIVERT ) 25 MG tablet [id number] No Take 1 tablet (25 mg total) by mouth 3 (three) times daily as needed for dizziness or nausea.  Patient not taking: Reported on 11/07/2023   Geofm Glade PARAS, MD Not Taking Active Self           Med Note [facility], S[place] A   Fri Nov 07, 2023  1:08 PM) Educated to discuss refill with pharmacy or PCP  metoprolol   succinate (TOPROL -XL) 100 MG 24 hr tablet [id number] Yes TAKE 1 TABLET BY MOUTH 2 TIMES DAILY. TAKE WITH OR IMMEDIATELY FOLLOWING A MEAL McGowen, Aleene DEL, MD Taking Active Self  oxybutynin  (DITROPAN  XL) 5 MG 24 hr tablet [id number] No Take 1 tablet (5 mg total) by mouth at bedtime.  Patient not taking: Reported on 10/31/2023   McGowen, Philip H, MD Not Taking Active Self           Med Note [facility], SOU[place] A   Fri Nov 07, 2023  1:10 PM) Educated to call pharmacy for refill request  potassium chloride  (KLOR-CON  M10) 10 MEQ tablet [id number] Yes Take 1 tablet (10 mEq total) by mouth daily.  Patient taking differently: Take 20 mEq by mouth daily.   McGowen, Aleene DEL, MD Taking Active Self            Home Care and Equipment/Supplies: Were Home Health Services Ordered?: Yes Name of Home Health Agency:: [facility] Has Agency set up a time to come to your home?: Yes (TOC RN called Hedda Sermon  with [facility]  to confirm visit) First Home Health Visit Date:  (unknown - patient to get call from [facility] later today) EMR reviewed for Home Health Orders: Orders present/patient has not received call (refer to CM for follow-up) Any new equipment or medical supplies ordered?: No  Functional Questionnaire: Do you need assistance with bathing/showering or dressing?: Yes (husband would help if needed) Do you need assistance with meal preparation?: Yes (husband assisting) Do you need assistance with eating?: No Do you have difficulty maintaining continence: No (denies incontience) Do you need assistance with getting out of bed/getting out of a chair/moving?: No Do you have difficulty managing or taking your medications?: No  Follow up appointments reviewed: PCP Follow-up appointment confirmed?: No (discussed hospital follow up PCP appt - currently scheduled in March - educated to call and move up appt - patient will call them Monday) MD Provider Line Number:[phone] Given: No Specialist Hospital Follow-up  appointment confirmed?: Yes Date of Specialist follow-up appointment?: 11/14/23 Follow-Up Specialty Provider:: Dr Pietro Do you need transportation to your follow-up appointment?: No Do you understand care options if your condition(s) worsen?: Yes-patient verbalized understanding (Educated to call MD on call with worsening issues - PCP number provided [phone] & Dr Pietro - patient has number - for emergency call 911 or go to ER)  SDOH Interventions Today    Flowsheet Row Most Recent Value  SDOH Interventions   Food Insecurity Interventions Intervention Not Indicated  Housing Interventions Intervention Not Indicated  Utilities Interventions Intervention Not Indicated       Goals Addressed               This Visit's Progress     Patient will report no readmissions in the next 30 days. (pt-stated)        Current Barriers:  Medication management : patient reports she does not have her lasix  20mg  tablets. Patient reports she will get from pharmacy today.  Diet/Nutrition/Food Resources : patient reports she has not reviewed discharge instructions and is unaware of fluid restriction  Provider appointments : patient reports no PCP follow up appt scheduled.  Home Health services : patient reports has not heard from [facility] at time of call Knowledge Deficits related to plan of care for management of CHF   RNCM Clinical Goal(s):  Patient will work with the Care Management team over the next 30 days to address Transition of Care Barriers: Medication Management Diet/Nutrition/Food Resources Provider appointments Home Health services verbalize understanding of plan for management of CHF as evidenced by patient reporting she is weighing daily and following her fluid restrictions take all medications exactly as prescribed and will call provider for medication related questions as evidenced by : patient reports taking medications attend all scheduled medical appointments: PCP and  specialist as evidenced by : patient reports and review of EMR  through collaboration with RN Care manager, provider, and care team.   Interventions: Evaluation of current treatment plan related to  self management and patient's adherence to plan as established by provider  Transitions of Care:  New goal. Doctor Visits  - discussed the importance of doctor visits Contacted Health RN/OT/PT - placed call to [facility]. Spoke with Mitzie and confirmed order received and Home health agency will contact  patient today Reviewed all discharge instructions with patient including medications, diet, and follow up instructions. Encouraged patient to call PCP office on 11/10/23 to schedule follow up appointment.  Encouraged patient to call pharmacy regarding Antivert  and Ditropan  prescription refills.  Offered  patient 30 day TOC program and patient accepted. Provided contact information for CM and scheduled next telephone visit for next week.  Reviewed with patient when to call 911.  Reviewed CBG levels and importance of eating well and continuing to monitor CBG levels. Encouraged patient to call MD for high or low readings.  Reviewed with patient that her insulin  is ordered twice a day.   Heart Failure Interventions:  (Status:  New goal.) Short Term Goal Basic overview and discussion of pathophysiology of Heart Failure reviewed Provided education on low sodium diet Assessed need for readable accurate scales in home Provided education about placing scale on hard, flat surface Advised patient to weigh each morning after emptying bladder Discussed importance of daily weight and advised patient to weigh and record daily Reviewed role of diuretics in prevention of fluid overload and management of heart failure; Discussed the importance of keeping all appointments with provider Advised patient to discuss any cramping with provider Assessed social determinant of health barriers  Reviewed heart  failure zones with patient and discussed importance of calling cardiology for weight gain of 2lbs in 24 hours or 5lbs in a week. Encouraged patient to call cardiology for increased in swelling, shortness of breath, or chest pain. Reviewed in depth fluid restriction of per day. Discussed with patient the limit of 3.5 (16oz) bottles of liquid per day(this includes water  and soda)  Patient Goals/Self-Care Activities: Participate in Transition of Care Program/Attend TOC scheduled calls Notify RN Care Manager of TOC call rescheduling needs Take all medications as prescribed Attend all scheduled provider appointments Call pharmacy for medication refills 3-7 days in advance of running out of medications Perform all self care activities independently  Call provider office for new concerns or questions  call office if I gain more than 2 pounds in one day or 5 pounds in one week track weight in diary use salt in moderation watch for swelling in feet, ankles and legs every day weigh myself daily bring diary to all appointments know when to call the doctor:for increased swelling, shortness of breath, chest pain, or weight gain. Follow fluid restrictions Continue to monitor CBG levels and call MD for high or low readings. Expect call from [facility]. If no call in the next 24-48 hours, call [facility].   Follow Up Plan:  Telephone follow up appointment with care management team member scheduled for:  11/13/23 The patient has been provided with contact information for the care management team and has been advised to call with any health related questions or concerns.        This visit completed with Alan Ee, RN BSN CEN. All documentation and assessments completed with Alan Ee and myself.   Shona Prow RN, CCM [place]  VBCI-Population Health RN Care Manager [phone]

## 2023-11-08 ENCOUNTER — Encounter: Payer: Self-pay | Admitting: Family Medicine

## 2023-11-08 LAB — CULTURE, BODY FLUID W GRAM STAIN -BOTTLE: Culture: NO GROWTH

## 2023-11-10 ENCOUNTER — Telehealth: Payer: Self-pay | Admitting: Cardiology

## 2023-11-10 ENCOUNTER — Telehealth: Payer: Self-pay | Admitting: *Deleted

## 2023-11-10 DIAGNOSIS — E1165 Type 2 diabetes mellitus with hyperglycemia: Secondary | ICD-10-CM | POA: Diagnosis not present

## 2023-11-10 DIAGNOSIS — K7581 Nonalcoholic steatohepatitis (NASH): Secondary | ICD-10-CM | POA: Diagnosis not present

## 2023-11-10 DIAGNOSIS — I110 Hypertensive heart disease with heart failure: Secondary | ICD-10-CM | POA: Diagnosis not present

## 2023-11-10 DIAGNOSIS — K7460 Unspecified cirrhosis of liver: Secondary | ICD-10-CM | POA: Diagnosis not present

## 2023-11-10 DIAGNOSIS — F32A Depression, unspecified: Secondary | ICD-10-CM | POA: Diagnosis not present

## 2023-11-10 DIAGNOSIS — E1142 Type 2 diabetes mellitus with diabetic polyneuropathy: Secondary | ICD-10-CM | POA: Diagnosis not present

## 2023-11-10 DIAGNOSIS — I5033 Acute on chronic diastolic (congestive) heart failure: Secondary | ICD-10-CM | POA: Diagnosis not present

## 2023-11-10 DIAGNOSIS — F411 Generalized anxiety disorder: Secondary | ICD-10-CM | POA: Diagnosis not present

## 2023-11-10 DIAGNOSIS — I480 Paroxysmal atrial fibrillation: Secondary | ICD-10-CM | POA: Diagnosis not present

## 2023-11-10 NOTE — Telephone Encounter (Signed)
 Patient has an appointment with Lawana Pray, NP on 2/14 for shortness of breath. I would advise Dr. Barbar Bonus office that procedure should be postponed at this point.  Gerldine Koch, NP-C  11/10/2023, 1:38 PM [address], [address] Office [phone] Fax [phone]

## 2023-11-10 NOTE — Telephone Encounter (Signed)
 I will fax notes to [facility] with Dr. Lugene Sahara, DDS office to please see notes from the prop APP. Pt has appt 11/14/23 with Lawana Pray, FNP.

## 2023-11-10 NOTE — Telephone Encounter (Signed)
 Our office received a duplicate request today from original request 10/23/23. Clearance was addressed by Marlyse Single, [facility] 10/28/23. Originally the office states did not receive fax. I re-faxed notes from Marlyse Single, [facility] to office on 10/28/23, which Cathyann Cobia states they received 10/29/23.  Cathyann Cobia, states they have the form they send to us  and if provider can sign that. I reviewed our protocol for the cardiologist and our preop APP's. To keep clearance request in a uniformed format for the preop team, we have a form that we input all the information from the requesting office. The preop APP and/or cardiologist review, d/w the pt and dictate their notes. All of this is all electronically signed, just the same as if the provider was in the hospital setting. I did state we have 7 office location which makes it difficult for the MD to sign.   I did tell Cathyann Cobia that I will re-fax notes again today so that they can the format. I did say that we manage a number of request from their office w/o complaint. We always do our very best to strive that all information is complete and correct. We do apologize again the fax # was entered incorrectly.

## 2023-11-10 NOTE — Telephone Encounter (Signed)
 [name] with Dr. Barbar Bonus office is calling requesting to speak with [name] regarding the clearance that was re-faxed. She states they are wanting clearance from the pt's recent hospital visit since original clearance was received.   She request if she is not available when calling back to ask to speak with [name] and she will update her regarding it.  Please advise.

## 2023-11-10 NOTE — Telephone Encounter (Signed)
 I will forward this to the preop APP to review the notes from the call today.

## 2023-11-12 NOTE — Progress Notes (Unsigned)
Cardiology Clinic Note   Patient Name: [name] Date of Encounter: 11/14/2023  Primary Care Provider:  Jeoffrey Massed, MD Primary Cardiologist:  Olga Millers, MD  Patient Profile    [name] 78 year old female presents the clinic today for follow-up evaluation of her acute on chronic CHF.  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 ? to get liver bx if she gets bariatric surgery? Mild portal hypertensive gastropathy on EGD 08/2019.   Fibromyalgia    GAD (generalized anxiety disorder)    GERD    History of hiatal hernia    History of iron deficiency anemia 12/2018   Inadequate absorption secondary to chronic/long term PPI therapy + portal hypertensive gastroduodenopathy. No GIB found on EGD, colonosc, and givens. Iron infusions X multiple.   History of thrombocytopenia 12/2011   Hyperlipidemia    Intolerant of statins   HYPERTENSION    IBS (irritable bowel syndrome)    -D.  Good response to bentyl and imodium as of 06/2018 GI f/u.   IDDM (insulin dependent diabetes mellitus)    with DPN (managed by Dr. Elvera Lennox but then in 2018 pt preferred to have me manage for her convenience)   Limited mobility    Requires a walker for arthritic pain, widespread musculoskeletal pain, and neuropathic pain.   Morbid obesity (HCC)    As of 11/2018, pt considering sleeve gastectomy vs bipass as of eval by Dr. Alvino Blood considering as of 12/2019.   Nonalcoholic steatohepatitis    Viral Hep screens NEG.  CT 2015.  Transaminasemia.  U/S 07/2018 showed early changes of cirrhosis.   OSA (obstructive sleep apnea) 09/14/2015   sleep study 09/07/15: severe obstructive sleep apnea with an AHI of 72 and SaO2 low of 75%.>referred to sleep MD   Osteoarthritis    hips,  shoulders, knees   PAF (paroxysmal atrial fibrillation) (HCC)    One documented episode (after getting EGD 2016).  Was on amiodarone x 3 mo.  Rate control with metoprolol + anticoag with xarelto. Watchman 12/2022, off anticoag   Portal hypertensive gastropathy (HCC)    hemorrhagic gastropathy + non-bleeding gastric ulcers on EGD 08/2023   Recurrent epistaxis    Granuloma in L nare cauterized by ENT 04/2020. Another cautery 06/2020   Small fiber neuropathy    Due to DM.  Symmetric hands and feet tingling/numbness.   Ulcerative colitis (HCC)    Remicade infusion Q 8 weeks: in clinical and endoscopic remission as of 12/2018 GI f/u.  06/11/19 rpt colonoscopy->cecal and ascending colon colitis.   Past Surgical History:  Procedure Laterality Date   ABDOMINAL HYSTERECTOMY  1980   Paps no longer indicated.   BACK SURGERY     BACTERIAL OVERGROWTH TEST N/A 07/13/2015   Procedure: BACTERIAL OVERGROWTH TEST;  Surgeon: Malissa Hippo, MD;  [facility];  Service: Endoscopy;  Laterality: N/A;  730     BILATERAL SALPINGOOPHORECTOMY  02/10/2001   BIOPSY  06/11/2019   Procedure: BIOPSY;  Surgeon: Malissa Hippo, MD;  [facility]: [facility];  Service: Endoscopy;;  colon   BIOPSY  09/27/2019   Procedure: BIOPSY;  Surgeon: Malissa Hippo, MD;  [facility]: [facility];  Service: Endoscopy;;  gastric duodenal   BIOPSY  09/15/2020  Procedure: BIOPSY;  Surgeon: Dolores Frame, MD;  [facility];  Service: Gastroenterology;;   BIOPSY  09/25/2023   Procedure: BIOPSY;  Surgeon: Dolores Frame, MD;  [facility];  Service: Gastroenterology;;   BREAST REDUCTION SURGERY  1994   bilat   BREAST SURGERY     CARDIOVASCULAR STRESS TEST  07/2010   Lexiscan myoview: normal   CARPAL TUNNEL RELEASE Right 1996   CARPAL TUNNEL RELEASE Left 03/21/2003   CARPAL TUNNEL RELEASE Right 05/04/2013   Procedure: CARPAL TUNNEL RELEASE;  Surgeon: Wyn Forster., MD;   Location: [facility];  Service: Orthopedics;  Laterality: Right;   CARPAL TUNNEL RELEASE Left 09/21/2013   Procedure: LEFT CARPAL TUNNEL RELEASE;  Surgeon: Wyn Forster., MD;  Location: [facility];  Service: Orthopedics;  Laterality: Left;   CHOLECYSTECTOMY     COLONOSCOPY WITH PROPOFOL N/A 08/04/2015   Colitis in remission.  No polyps.  Procedure: COLONOSCOPY WITH PROPOFOL;  Surgeon: Malissa Hippo, MD;  Location: [facility];  Service: Endoscopy;  Laterality: N/A;  cecum time in  0820   time out  0827    total time 7 [name]   COLONOSCOPY WITH PROPOFOL N/A 06/11/2019   cecal and ascending colon colitis.  Procedure: COLONOSCOPY WITH PROPOFOL;  Surgeon: Malissa Hippo, MD;  [facility];  Service: Endoscopy;  Laterality: N/A;  730a   COLONOSCOPY WITH PROPOFOL N/A 09/15/2020   Procedure: COLONOSCOPY WITH PROPOFOL;  Surgeon: Dolores Frame, MD;  [facility];  Service: Gastroenterology;  Laterality: N/A;  1030   ESOPHAGEAL DILATION N/A 08/04/2015   Procedure: ESOPHAGEAL DILATION;  Surgeon: Malissa Hippo, MD;  Location: [facility];  Service: Endoscopy;  Laterality: N/ALauralee Evener, no mucousal disruption   ESOPHAGOGASTRODUODENOSCOPY  09/27/2019   Performed for IDA.  Esoph dilation was done but no stricture present.  Mild portal hypertensive gastropathy, o/w normal.  Duodenal bx NEG.  h pylori neg.   ESOPHAGOGASTRODUODENOSCOPY     09/25/2023 inactive chronic gastritis, h pylor neg. benign   ESOPHAGOGASTRODUODENOSCOPY     09/25/23 hemorrhagic portal gastropathy + nonbleeding gastric ulcers (H pylori neg)   ESOPHAGOGASTRODUODENOSCOPY (EGD) WITH ESOPHAGEAL DILATION  12/02/2005   ESOPHAGOGASTRODUODENOSCOPY (EGD) WITH PROPOFOL N/A 08/04/2015   Procedure: ESOPHAGOGASTRODUODENOSCOPY (EGD) WITH PROPOFOL;  Surgeon: Malissa Hippo, MD;  Location: [facility];  Service: Endoscopy;  Laterality: N/A;  procedure 1   ESOPHAGOGASTRODUODENOSCOPY (EGD)  WITH PROPOFOL N/A 09/27/2019   Procedure: ESOPHAGOGASTRODUODENOSCOPY (EGD) WITH PROPOFOL;  Surgeon: Malissa Hippo, MD;  [facility];  Service: Endoscopy;  Laterality: N/A;  12:10   ESOPHAGOGASTRODUODENOSCOPY (EGD) WITH PROPOFOL N/A 10/31/2021   Procedure: ESOPHAGOGASTRODUODENOSCOPY (EGD) WITH PROPOFOL;  Surgeon: Malissa Hippo, MD;  [facility];  Service: Endoscopy;  Laterality: N/A;  930   ESOPHAGOGASTRODUODENOSCOPY (EGD) WITH PROPOFOL N/A 09/25/2023   Procedure: ESOPHAGOGASTRODUODENOSCOPY (EGD) WITH PROPOFOL;  Surgeon: Dolores Frame, MD;  [facility];  Service: Gastroenterology;  Laterality: N/A;  10:30AM;ASA 3   FLEXIBLE SIGMOIDOSCOPY  01/17/2012   Procedure: FLEXIBLE SIGMOIDOSCOPY;  Surgeon: Malissa Hippo, MD;  [facility];  Service: Endoscopy;  Laterality: N/A;   GIVENS CAPSULE STUDY N/A 08/03/2019   Procedure: GIVENS CAPSULE STUDY (performed for IDA)->some food debris in stomach and small amount of blood.  Surgeon: Malissa Hippo, MD;  [facility];  Service: Endoscopy;  Laterality: N/A;  730AM   HEMILAMINOTOMY LUMBAR  SPINE Bilateral 09/07/1999   L4-5   HOT HEMOSTASIS  09/25/2023   Procedure: HOT HEMOSTASIS (ARGON PLASMA COAGULATION/BICAP);  Surgeon: Marguerita Merles, Reuel Boom, MD;  [facility];  Service: Gastroenterology;;   KNEE ARTHROSCOPY Right 01/1999; 10/2000   LEFT ATRIAL APPENDAGE OCCLUSION N/A 01/23/2023   Procedure: LEFT ATRIAL APPENDAGE OCCLUSION;  Surgeon: [name], MD;  Location: [facility];  Service: Cardiovascular;  Laterality: N/A;   LEFT ATRIAL APPENDAGE OCCLUSION     LYSIS OF ADHESION  02/10/2001   MALONEY DILATION  09/27/2019   Procedure: MALONEY DILATION;  Surgeon: Malissa Hippo, MD;  [facility];  Service: Endoscopy;;   NASAL ENDOSCOPY WITH EPISTAXIS CONTROL Bilateral 01/25/2022   Procedure: NASAL ENDOSCOPY WITH EPISTAXIS CONTROL;  Surgeon: Osborn Coho, MD;  Location: [facility];  Service: ENT;  Laterality: Bilateral;   NASAL SEPTOPLASTY W/ TURBINOPLASTY Bilateral 01/25/2022   Procedure: NASAL SEPTOPLASTY WITH TURBINATE REDUCTION;  Surgeon: Osborn Coho, MD;  Location: [facility];  Service: ENT;  Laterality: Bilateral;   RECTOCELE REPAIR  1990; 09/12/2006   TARSAL TUNNEL RELEASE  2002   TEE WITHOUT CARDIOVERSION N/A 01/23/2023   Procedure: TRANSESOPHAGEAL ECHOCARDIOGRAM;  Surgeon: [name], MD;  Location: [facility] CV LAB;  Service: Cardiovascular;  Laterality: N/A;   TRANSESOPHAGEAL ECHOCARDIOGRAM (CATH LAB) N/A 08/26/2023   4mm leak around watchman device. Procedure: TRANSESOPHAGEAL ECHOCARDIOGRAM;  Surgeon: [name], MD;  Location: [facility] INVASIVE CV LAB;  Service: Cardiovascular;  Laterality: N/A;   TRANSTHORACIC ECHOCARDIOGRAM  08/04/2015   EF 60-65%, normal wall motion, mild LVH, mild LA dilation, grd Jennifer DD.  11/2023 overall poor quality, EF 55-60%   TUMOR EXCISION Left 03/21/2003   dorsal 1st web space (hand)   URETEROLYSIS Right 02/10/2001    Allergies  Allergies  Allergen Reactions   Flagyl [Metronidazole Hcl] Other (See Comments)    DIAPHORESIS   Omeprazole Anaphylaxis and Swelling    SWELLING OF TONGUE AND THROAT   Pioglitazone Other (See Comments) and Swelling    Weight gain, tongue swelling   Benzocaine-Menthol Swelling    SWELLING OF MOUTH   Metformin And Related Diarrhea   Shrimp [Shellfish Allergy] Itching    OF THROAT AND EARS, if consumed raw   Statins Palpitations   Welchol [Colesevelam] Other (See Comments)    GI UPSET   Allevyn Adhesive [Wound Dressings]     Other reaction(s): Unknown   Jardiance [Empagliflozin] Other (See Comments)    weakness   Lactose Diarrhea and Other (See Comments)    Gas, bloating   Lactose Intolerance (Gi) Diarrhea    Gas, bloating   Adhesive [Tape] Other (See Comments)    SKIN IRRITATION AND BRUISING   Desipramine Hcl Itching, Nausea Only and Other (See  Comments)    "swimmy" headed, ears itched   Hydromorphone Itching   Nisoldipine Itching   Percocet [Oxycodone-Acetaminophen] Itching    History of Present Illness    [name] has a PMH of chronic diastolic CHF, paroxysmal atrial fibrillation (status post watchman 4/24), CAD, type 2 diabetes, OSA on CPAP, HTN, HLD, nonalcoholic cirrhosis, diabetes, and morbid obesity.  She presented to the [facility] emergency department on 10/31/2023.  She reported progressive shortness of breath and lower extremity swelling.  She was seen and evaluated by cardiology in the ED.  She noted onset of her symptoms 2 days prior to admission.  She declined headache, chest pain, abdominal pain, fever and chills.  She was admitted and received IV diuresis.  Her dyspnea improved.  Her echocardiogram showed an LVEF of 55-60%.  She was discharged on furosemide 60 mg twice daily.  Lower extremity support stockings were recommended.  Fluid restriction was recommended.  Repeat BMP in 1 week and cardiology follow-up in 2 weeks.  She was instructed to weigh herself daily and maintain a weight log.  She presents to the clinic today for follow-up evaluation and preoperative cardiac evaluation.  She states she drinks a lot of fluid.  She enjoys water and Coke.  She has been drinking 2 bottles of Coke per day and also drinking water.  Her weight today is noted to be 218 pounds.  She continues to be in atrial fibrillation.  Initially her pulse is 101 and on recheck is 88.  We reviewed the importance of fluid restriction, low-sodium diet, and lower extremity support stockings.  She and her husband expressed understanding.  Jennifer will give her a weight log.  Her recent blood work shows stable renal function and electrolytes.  She notes that when she was in the hospital she did receive breathing treatments.  She requested a albuterol inhaler.  Jennifer recommended that she have this filled by her PCP.  She has been somewhat physically active doing  physical therapy 2 times per week.  Jennifer recommended that she continue to do physical therapy exercises on her own.  Jennifer will continue her current medication regimen and plan follow-up in 3 to 4 months.  Today she denies chest pain, increased shortness of breath,fatigue, palpitations, melena, hematuria, hemoptysis, diaphoresis, weakness, presyncope, syncope, orthopnea, and PND.     Home Medications    Prior to Admission medications   Medication Sig Start Date End Date Taking? Authorizing Provider  Accu-Chek Softclix Lancets lancets USE TO CHECK BLOOD GLUCOSE UP TO 6 TIMES DAILY AS DIRECTED 02/13/23   McGowen, Maryjean Morn, MD  acetaminophen (TYLENOL) 500 MG tablet Take 1,000 mg by mouth every 6 (six) hours as needed for mild pain (pain score 1-3).    [provider]  aspirin EC 81 MG tablet Take 1 tablet (81 mg total) by mouth daily. Swallow whole. 07/23/23   Georgie Chard D, NP  blood glucose meter kit and supplies KIT Use up to six times daily as directed. DX. E11.9 01/05/20   McGowen, Maryjean Morn, MD  canagliflozin (INVOKANA) 100 MG TABS tablet Take 1 tablet (100 mg total) by mouth daily before breakfast. 05/29/23   McGowen, Maryjean Morn, MD  citalopram (CELEXA) 20 MG tablet Take 1 tablet (20 mg total) by mouth daily. 05/29/23   McGowen, Maryjean Morn, MD  diphenoxylate-atropine (LOMOTIL) 2.5-0.025 MG tablet Take 1 tablet by mouth 4 (four) times daily as needed for diarrhea or loose stools. Patient not taking: Reported on 11/07/2023 12/10/22   Marguerita Merles, Reuel Boom, MD  Dulaglutide (TRULICITY) 1.5 MG/0.5ML SOAJ INJECT 1.5 MG (0.5ML) UNDER THE SKIN ONCE A WEEK 11/07/23   McGowen, Maryjean Morn, MD  esomeprazole (NEXIUM) 40 MG capsule Take 1 capsule (40 mg total) by mouth daily before breakfast. 09/25/23   Marguerita Merles, Reuel Boom, MD  furosemide (LASIX) 20 MG tablet Take 3 tablets (60 mg total) by mouth 2 (two) times daily. 11/05/23 02/03/24  [place], Alvira Philips, DO  gabapentin (NEURONTIN) 600 MG tablet TAKE 1 AND 1/2  TABLETS BY MOUTH TWICE A DAY 09/30/23   McGowen, Maryjean Morn, MD  glucose blood (ACCU-CHEK GUIDE) test strip USE TO CHECK BLOOD GLUCOSE UP TO 6 TIMES DAILY AS DIRECTED 03/04/23   McGowen, Maryjean Morn, MD  HYDROcodone-acetaminophen (NORCO/VICODIN) 5-325 MG tablet 1-2 tabs po bid prn pain Patient not taking: Reported on 11/07/2023 09/30/23   Jeoffrey Massed, MD  INFLIXIMAB IV Inject into the vein every 8 (eight) weeks. Remicaid 5mg /kg Infusion every 8 weeks [facility]) Patient not taking: Reported on 11/07/2023    [provider]  Insulin Pen Needle (B-D UF III MINI PEN NEEDLES) 31G X 5 MM MISC USE TO INJECT INSULINS EQUAL TO 6 TIMES DAILY. 03/04/23   McGowen, Maryjean Morn, MD  insulin regular human CONCENTRATED (HUMULIN R U-500 KWIKPEN) 500 UNIT/ML KwikPen Inject 100 units into the skin at breakfast and lunch. 06/05/23   McGowen, Maryjean Morn, MD  lactase (LACTAID) 3000 UNITS tablet Take 3,000 Units by mouth as needed (when eating foods containing dairy).     [provider]  meclizine (ANTIVERT) 25 MG tablet Take 1 tablet (25 mg total) by mouth 3 (three) times daily as needed for dizziness or nausea. Patient not taking: Reported on 11/07/2023 12/07/15   Pincus Sanes, MD  metoprolol succinate (TOPROL-XL) 100 MG 24 hr tablet TAKE 1 TABLET BY MOUTH 2 TIMES DAILY. TAKE WITH OR IMMEDIATELY FOLLOWING A MEAL 05/29/23   McGowen, Maryjean Morn, MD  oxybutynin (DITROPAN XL) 5 MG 24 hr tablet Take 1 tablet (5 mg total) by mouth at bedtime. Patient not taking: Reported on 10/31/2023 08/08/23   Jeoffrey Massed, MD  potassium chloride (KLOR-CON M10) 10 MEQ tablet Take 1 tablet (10 mEq total) by mouth daily. Patient taking differently: Take 20 mEq by mouth daily. 05/29/23   McGowen, Maryjean Morn, MD  bromocriptine (PARLODEL) 2.5 MG tablet Take 2.5 mg by mouth 2 (two) times daily.  12/05/11  [provider]    Family History    Family History  Problem Relation Age of Onset   Diabetes Mother     Hypertension Mother    Heart attack Father        Mid 77's   Heart disease Father    Lung disease Father        spot on lung; had lung surgery   Alcohol abuse Other    Hypertension Son    Diabetes Son    Emphysema Maternal Grandfather    Asthma Maternal Grandfather    Colon cancer Paternal Grandfather    Stomach cancer Paternal Grandmother    Neuropathy Neg Hx    She indicated that her mother is deceased. She indicated that her father is deceased. She indicated that her maternal grandmother is deceased. She indicated that her maternal grandfather is deceased. She indicated that her paternal grandmother is deceased. She indicated that her paternal grandfather is deceased. She indicated that the status of her son is unknown. She indicated that the status of her neg hx is unknown. She indicated that the status of her other is unknown.  Social History    Social History   Socioeconomic History   Marital status: Married    Spouse name: Not on file   Number of children: Not on file   Years of education: Not on file   Highest education level: Not on file  Occupational History   Occupation: Retired  Tobacco Use   Smoking status: Never   Smokeless tobacco: Never  Vaping Use   Vaping status: Never Used  Substance and Sexual Activity   Alcohol use: Not Currently    Comment: ocassionally   Drug use: No   Sexual activity: Not Currently    Partners: Male  Birth control/protection: Surgical    Comment: hysterectomy  Other Topics Concern   Not on file  Social History Narrative   She lives with husband in two-story home.  They have one grown son and 2 grandchildren.   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   Highest of level education:  Some college.   Never smoker.   Alcohol: rare.   Social Drivers of Corporate investment banker Strain: Low Risk  (01/15/2023)   Overall Financial Resource Strain (CARDIA)    Difficulty of Paying Living Expenses: Not hard at all  Food Insecurity: No  Food Insecurity (11/07/2023)   Hunger Vital Sign    Worried About Running Out of Food in the Last Year: Never true    Ran Out of Food in the Last Year: Never true  Transportation Needs: No Transportation Needs (11/07/2023)   PRAPARE - Administrator, Civil Service (Medical): No    Lack of Transportation (Non-Medical): No  Physical Activity: Inactive (01/15/2023)   Exercise Vital Sign    Days of Exercise per Week: 0 days    [name] of Exercise per Session: 0 min  Stress: No Stress Concern Present (01/15/2023)   [employer] of Occupational Health - Occupational Stress Questionnaire    Feeling of Stress : Not at all  Social Connections: Moderately Isolated (10/31/2023)   Social Connection and Isolation Panel [NHANES]    Frequency of Communication with Friends and Family: More than three times a week    Frequency of Social Gatherings with Friends and Family: More than three times a week    Attends Religious Services: Never    Database administrator or Organizations: No    Attends Banker Meetings: Never    Marital Status: Married  Catering manager Violence: Not At Risk (11/07/2023)   Humiliation, Afraid, Rape, and Kick questionnaire    Fear of Current or Ex-Partner: No    Emotionally Abused: No    Physically Abused: No    Sexually Abused: No     Review of Systems    General:  No chills, fever, night sweats or weight changes.  Cardiovascular:  No chest pain, dyspnea on exertion, edema, orthopnea, palpitations, paroxysmal nocturnal dyspnea. Dermatological: No rash, lesions/masses Respiratory: No cough, dyspnea Urologic: No hematuria, dysuria Abdominal:   No nausea, vomiting, diarrhea, bright red blood per rectum, melena, or hematemesis Neurologic:  No visual changes, wkns, changes in mental status. All other systems reviewed and are otherwise negative except as noted above.  Physical Exam    VS:  BP 103/65   Pulse 88   Ht 5\' 2"  (1.575 m)   Wt 220 lb 6.4  oz (100 kg)   SpO2 96%   BMI 40.31 kg/m  , BMI Body mass index is 40.31 kg/m. GEN: Well nourished, well developed, in no acute distress. HEENT: normal. Neck: Supple, no JVD, carotid bruits, or masses. Cardiac: RRR, no murmurs, rubs, or gallops. No clubbing, cyanosis, bilateral lower extremity 1+ slight pitting edema.  Radials/DP/PT 2+ and equal bilaterally.  Respiratory:  Respirations regular and unlabored, clear to auscultation bilaterally. GI: Soft, nontender, nondistended, BS + x 4. MS: no deformity or atrophy. Skin: warm and dry, no rash. Neuro:  Strength and sensation are intact. Psych: Normal affect.  Accessory Clinical Findings    Recent Labs: 09/19/2023: ALT 20 11/02/2023: Hemoglobin 8.4; Platelets 172 11/04/2023: Magnesium 1.8 11/05/2023: B Natriuretic Peptide 537.0; BUN 14; Creatinine, Ser 0.66; Potassium 3.9; Sodium 137   Recent Lipid  Panel    Component Value Date/Time   CHOL 149 05/06/2023 1450   TRIG 123 05/06/2023 1450   TRIG 196 01/26/2010 0000   HDL 38 (L) 05/06/2023 1450   CHOLHDL 3.9 05/06/2023 1450   CHOLHDL 5 08/28/2022 1019   VLDL 27.8 08/28/2022 1019   LDLCALC 89 05/06/2023 1450   LDLDIRECT 69.5 01/24/2011 1125         ECG personally reviewed by me today- EKG Interpretation Date/Time:  Friday November 14 2023 11:28:33 EST Ventricular Rate:  101 PR Interval:    QRS Duration:  78 QT Interval:  304 QTC Calculation: 394 R Axis:   -39  Text Interpretation: Atrial fibrillation with rapid ventricular response Left axis deviation Low voltage QRS Confirmed by Edd Fabian [phone]) on 11/14/2023 11:38:55 AM   Echocardiogram 11/02/2023  IMPRESSIONS     1. Technically difficult study with poor echo windows, despite Definity  contrast   2. Left ventricular ejection fraction, by estimation, is 55 to 60%. The  left ventricle has normal function. Left ventricular endocardial border  not optimally defined to evaluate regional wall motion. Left ventricular   diastolic function could not be  evaluated.   3. Right ventricular systolic function was not well visualized. The right  ventricular size is not well visualized.   4. The mitral valve was not well visualized. Trivial mitral valve  regurgitation.   5. The aortic valve was not well visualized. Aortic valve regurgitation  is not visualized.   Comparison(s): Changes from prior study are noted. 08/26/2023: LVEF  60-65%.   FINDINGS   Left Ventricle: Left ventricular ejection fraction, by estimation, is 55  to 60%. The left ventricle has normal function. Left ventricular  endocardial border not optimally defined to evaluate regional wall motion.  Definity contrast agent was given IV to  delineate the left ventricular endocardial borders. The left ventricular  internal cavity size was normal in size. There is no left ventricular  hypertrophy. Left ventricular diastolic function could not be evaluated  due to atrial fibrillation. Left  ventricular diastolic function could not be evaluated.   Right Ventricle: The right ventricular size is not well visualized. Right  vetricular wall thickness was not well visualized. Right ventricular  systolic function was not well visualized.   Left Atrium: Left atrial size was normal in size.   Right Atrium: Right atrial size was normal in size.   Pericardium: There is no evidence of pericardial effusion.   Mitral Valve: The mitral valve was not well visualized. Trivial mitral  valve regurgitation.   Tricuspid Valve: The tricuspid valve is not well visualized. Tricuspid  valve regurgitation is not demonstrated.   Aortic Valve: The aortic valve was not well visualized. Aortic valve  regurgitation is not visualized.   Pulmonic Valve: The pulmonic valve was not well visualized. Pulmonic valve  regurgitation is not visualized.   Aorta: The aortic root and ascending aorta are structurally normal, with  no evidence of dilitation.   IAS/Shunts: The  interatrial septum was not well visualized.       Assessment & Plan   1.  Acute on chronic diastolic CHF-no increased work of breathing today.  Is not doing physical therapy 2 times per week.  Recent admission with requirement of IV diuresis.  Encouraged daily weights and fluid restriction-48-64 ounces daily Continue furosemide Heart healthy low-sodium diet-salty 6 diet sheet given Fluid restriction-less than 64 ounces daily Lower extremity support stockings-Yettem support stocking sheet given Continue daily weights-contact office with a weight  increase of 2 to 3 pounds overnight or 5 pounds in 1 week.  Atrial fibrillation-heart rate today 88.  Not a candidate for anticoagulation due to recurrent epistaxis.  Status post watchman 4/24. Continue metoprolol Avoid triggers caffeine, chocolate, EtOH, dehydration etc.  Coronary artery disease-no recent episodes of arm neck back or chest discomfort.  Denies exertional chest pain. Continue aspirin therapy Heart healthy low-sodium diet Increase physical activity as tolerated  Preoperative cardiac evaluation- #31 with bone grafting and future implant under IV sedation    Date of Surgery:  Clearance TBD                                  Surgeon:  Not indicated  Surgeon's Group or Practice Name:  [facility]  Phone number:  [phone] Fax number:  [phone]      Primary Cardiologist: Olga Millers, MD  Chart reviewed as part of pre-operative protocol coverage. Given past medical history and time since last visit, based on ACC/AHA guidelines, [name] would be at acceptable risk for the planned procedure without further cardiovascular testing.   Her RCRI is moderate risk, 6.6% risk of major cardiac event.  She is able to complete greater than 4 METS of physical activity.  SBE prophylaxis is not needed.However, the pt notes that she has been given antibiotics to take for her procedure. It is  reasonable to go ahead and take antibiotic prophylaxis as she has been directed.   Regarding ASA therapy, we recommend continuation of ASA throughout the perioperative period. However, if the surgeon feels that cessation of ASA is required in the perioperative period, it may be stopped 5-7 days prior to surgery with a plan to resume it as soon as felt to be feasible from a surgical standpoint in the post-operative period.   Patient was advised that if she develops new symptoms prior to surgery to contact our office to arrange a follow-up appointment.  He verbalized understanding.  Disposition: Follow-up with Dr. Jens Som or me in 3-4 months.   [name]     11/14/2023, 12:12 PM [name] [address] Office [phone] Fax [phone]    [name] examining this patient, reviewing medications, and using patient centered shared decision making involving their cardiac care.   [name] reviewing past medical history,  medications, and prior cardiac tests.

## 2023-11-13 ENCOUNTER — Other Ambulatory Visit: Payer: Self-pay

## 2023-11-13 DIAGNOSIS — E1142 Type 2 diabetes mellitus with diabetic polyneuropathy: Secondary | ICD-10-CM | POA: Diagnosis not present

## 2023-11-13 DIAGNOSIS — F411 Generalized anxiety disorder: Secondary | ICD-10-CM | POA: Diagnosis not present

## 2023-11-13 DIAGNOSIS — I480 Paroxysmal atrial fibrillation: Secondary | ICD-10-CM | POA: Diagnosis not present

## 2023-11-13 DIAGNOSIS — F32A Depression, unspecified: Secondary | ICD-10-CM | POA: Diagnosis not present

## 2023-11-13 DIAGNOSIS — E1165 Type 2 diabetes mellitus with hyperglycemia: Secondary | ICD-10-CM | POA: Diagnosis not present

## 2023-11-13 DIAGNOSIS — K7581 Nonalcoholic steatohepatitis (NASH): Secondary | ICD-10-CM | POA: Diagnosis not present

## 2023-11-13 DIAGNOSIS — I110 Hypertensive heart disease with heart failure: Secondary | ICD-10-CM | POA: Diagnosis not present

## 2023-11-13 DIAGNOSIS — I5033 Acute on chronic diastolic (congestive) heart failure: Secondary | ICD-10-CM | POA: Diagnosis not present

## 2023-11-13 DIAGNOSIS — K7460 Unspecified cirrhosis of liver: Secondary | ICD-10-CM | POA: Diagnosis not present

## 2023-11-13 NOTE — Patient Outreach (Signed)
  Care Management  Transitions of Care Program Transitions of Care Post-discharge week 2  11/13/2023 Name: [name] MRN: [id number] DOB: 1945/10/15  Subjective: [name] is a 78 y.o. year old female who is a primary care patient of McGowen, Maryjean Morn, MD. The Care Management team was unable to reach the patient by phone to assess and address transitions of care needs. Placed call to preferred number and husband answered and states to call the home number. Placed call to home number and no answer and voicemail is full.   Plan: Additional outreach attempts will be made to reach the patient enrolled in the [facility] Program (Post Inpatient/ED Visit).  Lonia Chimera, RN, BSN, CEN [employer]- Transition of Care Team.  [facility] [phone]

## 2023-11-14 ENCOUNTER — Telehealth: Payer: Self-pay

## 2023-11-14 ENCOUNTER — Encounter: Payer: Self-pay | Admitting: General Practice

## 2023-11-14 ENCOUNTER — Ambulatory Visit: Payer: Medicare PPO | Attending: General Practice | Admitting: General Practice

## 2023-11-14 VITALS — BP 103/65 | HR 88 | Ht 62.0 in | Wt 220.4 lb

## 2023-11-14 DIAGNOSIS — I110 Hypertensive heart disease with heart failure: Secondary | ICD-10-CM | POA: Diagnosis not present

## 2023-11-14 DIAGNOSIS — Z95818 Presence of other cardiac implants and grafts: Secondary | ICD-10-CM

## 2023-11-14 DIAGNOSIS — I5033 Acute on chronic diastolic (congestive) heart failure: Secondary | ICD-10-CM

## 2023-11-14 DIAGNOSIS — I480 Paroxysmal atrial fibrillation: Secondary | ICD-10-CM

## 2023-11-14 DIAGNOSIS — Z01810 Encounter for preprocedural cardiovascular examination: Secondary | ICD-10-CM

## 2023-11-14 DIAGNOSIS — I10 Essential (primary) hypertension: Secondary | ICD-10-CM | POA: Diagnosis not present

## 2023-11-14 MED ORDER — MECLIZINE HCL 25 MG PO TABS: 25.0000 mg | ORAL_TABLET | Freq: Three times a day (TID) | ORAL | 2 refills | Status: DC | PRN

## 2023-11-14 NOTE — Patient Instructions (Addendum)
Medication Instructions:  Continue Lasix 60 mg twice daily Your physician recommends that you continue on your current medications as directed. Please refer to the Current Medication list given to you today.  *If you need a refill on your cardiac medications before your next appointment, please call your pharmacy*   Lab Work: NONE ordered at this time of appointment   Testing/Procedures: NONE ordered at this time of appointment   Follow-Up: At [facility], you and your health needs are our priority.  As part of our continuing mission to provide you with exceptional heart care, we have created designated Provider Care Teams.  These Care Teams include your primary Cardiologist (physician) and Advanced Practice Providers (APPs -  Physician Assistants and Nurse Practitioners) who all work together to provide you with the care you need, when you need it.  We recommend signing up for the patient portal called "MyChart".  Sign up information is provided on this After Visit Summary.  MyChart is used to connect with patients for Virtual Visits (Telemedicine).  Patients are able to view lab/test results, encounter notes, upcoming appointments, etc.  Non-urgent messages can be sent to your provider as well.   To learn more about what you can do with MyChart, go to [email or web address].    Your next appointment:   3-4 month(s)  Provider:   Olga Millers, MD  or Edd Fabian, FNP        Other Instructions  Hold Asprin 5-7 days prior to procedure. Take antibiotic as directed prior to procedure.  Please follow a 48-64 oz fluid restriction daily as directed.  Daily Weight Record- please call our office if you noticed 2-3 lb weight gain over night or 5 lb weight gain in 1 week.  It is important to weigh yourself daily. To do this: Make sure you use a reliable scale. Use the same scale each day. Keep this daily weight chart near your scale. Weigh yourself each morning at the same time  after you use the bathroom. Before weighing yourself: Take off your shoes. Make sure you are wearing the same amount of clothing each day. Write down your weight in the spaces on the form. Compare today's weight to yesterday's weight. Bring this form with you to your follow-up visits with your health care provider. Call your health care provider if you have concerns about your weight, including rapid weight gain or loss. Date: ________ Weight: ____________________ Date: ________ Weight: ____________________ Date: ________ Weight: ____________________ Date: ________ Weight: ____________________ Date: ________ Weight: ____________________ Date: ________ Weight: ____________________ Date: ________ Weight: ____________________ Date: ________ Weight: ____________________ Date: ________ Weight: ____________________ Date: ________ Weight: ____________________ Date: ________ Weight: ____________________ Date: ________ Weight: ____________________ Date: ________ Weight: ____________________ Date: ________ Weight: ____________________ Date: ________ Weight: ____________________ Date: ________ Weight: ____________________ Date: ________ Weight: ____________________ Date: ________ Weight: ____________________ Date: ________ Weight: ____________________ Date: ________ Weight: ____________________ Date: ________ Weight: ____________________ Date: ________ Weight: ____________________ Date: ________ Weight: ____________________ Date: ________ Weight: ____________________ Date: ________ Weight: ____________________ Date: ________ Weight: ____________________ Date: ________ Weight: ____________________ Date: ________ Weight: ____________________ Date: ________ Weight: ____________________ Date: ________ Weight: ____________________ Date: ________ Weight: ____________________ Date: ________ Weight: ____________________ Date: ________ Weight: ____________________ Date: ________ Weight:  ____________________ Date: ________ Weight: ____________________ Date: ________ Weight: ____________________ Date: ________ Weight: ____________________ Date: ________ Weight: ____________________ Date: ________ Weight: ____________________ Date: ________ Weight: ____________________ Date: ________ Weight: ____________________ Date: ________ Weight: ____________________ Date: ________ Weight: ____________________ Date: ________ Weight: ____________________ Date: ________ Weight: ____________________ Date: ________ Weight: ____________________ Date: ________ Weight:  ____________________ Date: ________ Weight: ____________________ Date: ________ Weight: ____________________ Date: ________ Weight: ____________________ This information is not intended to replace advice given to you by your health care provider. Make sure you discuss any questions you have with your health care provider. Document Revised: 05/22/2021 Document Reviewed: 05/22/2021 Else[employer] Patient Education  2024 [employer].

## 2023-11-14 NOTE — Addendum Note (Signed)
Addended by: Lamar Benes on: 11/14/2023 12:28 PM   Modules accepted: Orders

## 2023-11-14 NOTE — Patient Outreach (Signed)
  Care Management  Transitions of Care Program Transitions of Care Post-discharge week 2  11/14/2023 Name: [name] MRN: [id number] DOB: Aug 17, 1946  Subjective: [name] is a 78 y.o. year old female who is a primary care patient of McGowen, Maryjean Morn, MD. The Care Management team was unable to reach the patient by phone to assess and address transitions of care needs.   Plan: Additional outreach attempts will be made to reach the patient enrolled in the [facility] Program (Post Inpatient/ED Visit).  Lonia Chimera, RN, BSN, CEN [employer]- Transition of Care Team.  [facility] [phone]

## 2023-11-17 ENCOUNTER — Encounter: Payer: Self-pay | Admitting: Family Medicine

## 2023-11-17 ENCOUNTER — Ambulatory Visit (INDEPENDENT_AMBULATORY_CARE_PROVIDER_SITE_OTHER): Payer: Medicare PPO | Admitting: Family Medicine

## 2023-11-17 ENCOUNTER — Telehealth: Payer: Self-pay

## 2023-11-17 ENCOUNTER — Encounter (INDEPENDENT_AMBULATORY_CARE_PROVIDER_SITE_OTHER): Payer: Self-pay | Admitting: *Deleted

## 2023-11-17 VITALS — BP 106/67 | HR 69 | Ht 62.0 in | Wt 222.0 lb

## 2023-11-17 DIAGNOSIS — D508 Other iron deficiency anemias: Secondary | ICD-10-CM

## 2023-11-17 DIAGNOSIS — E1140 Type 2 diabetes mellitus with diabetic neuropathy, unspecified: Secondary | ICD-10-CM

## 2023-11-17 DIAGNOSIS — Z794 Long term (current) use of insulin: Secondary | ICD-10-CM

## 2023-11-17 DIAGNOSIS — I5033 Acute on chronic diastolic (congestive) heart failure: Secondary | ICD-10-CM | POA: Diagnosis not present

## 2023-11-17 MED ORDER — ALBUTEROL SULFATE HFA 108 (90 BASE) MCG/ACT IN AERS: 2.0000 | INHALATION_SPRAY | Freq: Four times a day (QID) | RESPIRATORY_TRACT | 0 refills | Status: DC | PRN

## 2023-11-17 NOTE — Progress Notes (Unsigned)
11/17/2023  CC:  Chief Complaint  Patient presents with   Hospitalization Follow-up    Patient is a 78 y.o.  female who presents accompanied by her husband for  hospital follow up, specifically [facility] face-to-face visit. Dates hospitalized: 1/31 to 11/05/2023. Days since d/c from hospital: 12 days Patient was discharged from hospital to home with home health. Reason for admission to hospital: Acute on chronic congestive heart failure. Date of interactive (phone) contact with patient and/or caregiver: Unsuccessful attempts x 2 (11/13/2023 and 11/14/2023).  I have reviewed patient's discharge summary plus pertinent specific notes, labs, and imaging from the hospitalization.    CURRENTLY: Feels significant improvement, energy level returning, much less dyspnea on exertion. She feels like her legs swelling has improved some.  Home glucoses are varying a lot, per her usual.  Occasionally she will have hypoglycemia in the night or early morning.   Discharge medication list: Aspirin 81 mg a day, Invokana 100 mg a day, citalopram 20 mg a day, Lomotil 4 times daily as needed, Nexium 40 mg a day, Lasix 60 mg twice daily, gabapentin 900 mg twice daily, Humulin R 100 units twice daily, Vicodin 5/325 twice daily as needed, infliximab IV every 8 weeks, lactase as needed, meclizine 25 mg 3 times daily as needed, Toprol-XL 100 mg twice daily, oxybutynin 5 mg nightly, potassium 10 mill equivalent daily, Trulicity 1.5 mg SQ weekly. Medication reconciliation was done today and patient  is taking meds as recommended by discharging hospitalist/specialist.     ROS:  ROS as above, plus--> no fevers, no CP, no wheezing, no cough, no dizziness, no HAs, no rashes, no melena/hematochezia.  No polyuria or polydipsia.    No focal weakness, paresthesias, or tremors.  No acute vision or hearing abnormalities.  No dysuria or unusual/new urinary urgency or frequency.   No n/v/d or abd pain.  No  palpitations.     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 ? to get liver bx if she gets bariatric surgery? Mild portal hypertensive gastropathy on EGD 08/2019.   Fibromyalgia    GAD (generalized anxiety disorder)    GERD    History of hiatal hernia    History of iron deficiency anemia 12/2018   Inadequate absorption secondary to chronic/long term PPI therapy + portal hypertensive gastroduodenopathy. No GIB found on EGD, colonosc, and givens. Iron infusions X multiple.   History of thrombocytopenia 12/2011   Hyperlipidemia    Intolerant of statins   HYPERTENSION    IBS (irritable bowel syndrome)    -D.  Good response to bentyl and imodium as of 06/2018 GI f/u.   IDDM (insulin dependent diabetes mellitus)    with DPN (managed by Dr. Elvera Lennox but then in 2018 pt preferred to have me manage for her convenience)   Limited mobility    Requires a walker for arthritic pain, widespread musculoskeletal pain, and neuropathic pain.   Morbid obesity (HCC)    As of 11/2018, pt considering sleeve gastectomy vs bipass as of eval by Dr. Alvino Blood considering as of 12/2019.   Nonalcoholic steatohepatitis    Viral Hep screens NEG.  CT 2015.  Transaminasemia.  U/S 07/2018 showed early changes of cirrhosis.   OSA (obstructive sleep apnea) 09/14/2015   sleep study 09/07/15: severe obstructive sleep apnea  with an AHI of 72 and SaO2 low of 75%.>referred to sleep MD   Osteoarthritis    hips, shoulders, knees   PAF (paroxysmal atrial fibrillation) (HCC)    One documented episode (after getting EGD 2016).  Was on amiodarone x 3 mo.  Rate control with metoprolol + anticoag with xarelto. Watchman 12/2022, off anticoag   Portal hypertensive gastropathy (HCC)    hemorrhagic gastropathy + non-bleeding gastric  ulcers on EGD 08/2023   Recurrent epistaxis    Granuloma in L nare cauterized by ENT 04/2020. Another cautery 06/2020   Small fiber neuropathy    Due to DM.  Symmetric hands and feet tingling/numbness.   Ulcerative colitis (HCC)    Remicade infusion Q 8 weeks: in clinical and endoscopic remission as of 12/2018 GI f/u.  06/11/19 rpt colonoscopy->cecal and ascending colon colitis.    PSH:  Past Surgical History:  Procedure Laterality Date   ABDOMINAL HYSTERECTOMY  1980   Paps no longer indicated.   BACK SURGERY     BACTERIAL OVERGROWTH TEST N/A 07/13/2015   Procedure: BACTERIAL OVERGROWTH TEST;  Surgeon: Malissa Hippo, MD;  [facility];  Service: Endoscopy;  Laterality: N/A;  730     BILATERAL SALPINGOOPHORECTOMY  02/10/2001   BIOPSY  06/11/2019   Procedure: BIOPSY;  Surgeon: Malissa Hippo, MD;  [facility];  Service: Endoscopy;;  colon   BIOPSY  09/27/2019   Procedure: BIOPSY;  Surgeon: Malissa Hippo, MD;  [facility];  Service: Endoscopy;;  gastric duodenal   BIOPSY  09/15/2020   Procedure: BIOPSY;  Surgeon: Dolores Frame, MD;  [facility];  Service: Gastroenterology;;   BIOPSY  09/25/2023   Procedure: BIOPSY;  Surgeon: Dolores Frame, MD;  [facility];  Service: Gastroenterology;;   BREAST REDUCTION SURGERY  1994   bilat   BREAST SURGERY     CARDIOVASCULAR STRESS TEST  07/2010   Lexiscan myoview: normal   CARPAL TUNNEL RELEASE Right 1996   CARPAL TUNNEL RELEASE Left 03/21/2003   CARPAL TUNNEL RELEASE Right 05/04/2013   Procedure: CARPAL TUNNEL RELEASE;  Surgeon: Wyn Forster., MD;  Location: [facility];  Service: Orthopedics;  Laterality: Right;   CARPAL TUNNEL RELEASE Left 09/21/2013   Procedure: LEFT CARPAL TUNNEL RELEASE;  Surgeon: Wyn Forster., MD;  Location: [facility];  Service: Orthopedics;  Laterality: Left;   CHOLECYSTECTOMY      COLONOSCOPY WITH PROPOFOL N/A 08/04/2015   Colitis in remission.  No polyps.  Procedure: COLONOSCOPY WITH PROPOFOL;  Surgeon: Malissa Hippo, MD;  [facility]: [facility];  Service: Endoscopy;  Laterality: N/A;  cecum time in  0820   time out  0827    total time 7 minutes   COLONOSCOPY WITH PROPOFOL N/A 06/11/2019   cecal and ascending colon colitis.  Procedure: COLONOSCOPY WITH PROPOFOL;  Surgeon: Malissa Hippo, MD;  [facility];  Service: Endoscopy;  Laterality: N/A;  730a   COLONOSCOPY WITH PROPOFOL N/A 09/15/2020   Procedure: COLONOSCOPY WITH PROPOFOL;  Surgeon: Dolores Frame, MD;  [facility];  Service: Gastroenterology;  Laterality: N/A;  1030   ESOPHAGEAL DILATION N/A 08/04/2015   Procedure: ESOPHAGEAL DILATION;  Surgeon: Malissa Hippo, MD;  [facility]: [facility];  Service: Endoscopy;  Laterality: N/ALauralee Evener, no mucousal disruption   ESOPHAGOGASTRODUODENOSCOPY  09/27/2019   Performed for IDA.  Esoph dilation was done but no stricture  present.  Mild portal hypertensive gastropathy, o/w normal.  Duodenal bx NEG.  h pylori neg.   ESOPHAGOGASTRODUODENOSCOPY     09/25/2023 inactive chronic gastritis, h pylor neg. benign   ESOPHAGOGASTRODUODENOSCOPY     09/25/23 hemorrhagic portal gastropathy + nonbleeding gastric ulcers (H pylori neg)   ESOPHAGOGASTRODUODENOSCOPY (EGD) WITH ESOPHAGEAL DILATION  12/02/2005   ESOPHAGOGASTRODUODENOSCOPY (EGD) WITH PROPOFOL N/A 08/04/2015   Procedure: ESOPHAGOGASTRODUODENOSCOPY (EGD) WITH PROPOFOL;  Surgeon: Malissa Hippo, MD;  [facility];  Service: Endoscopy;  Laterality: N/A;  procedure 1   ESOPHAGOGASTRODUODENOSCOPY (EGD) WITH PROPOFOL N/A 09/27/2019   Procedure: ESOPHAGOGASTRODUODENOSCOPY (EGD) WITH PROPOFOL;  Surgeon: Malissa Hippo, MD;  [facility];  Service: Endoscopy;  Laterality: N/A;  12:10   ESOPHAGOGASTRODUODENOSCOPY (EGD) WITH PROPOFOL N/A 10/31/2021   Procedure: ESOPHAGOGASTRODUODENOSCOPY  (EGD) WITH PROPOFOL;  Surgeon: Malissa Hippo, MD;  [facility];  Service: Endoscopy;  Laterality: N/A;  930   ESOPHAGOGASTRODUODENOSCOPY (EGD) WITH PROPOFOL N/A 09/25/2023   Procedure: ESOPHAGOGASTRODUODENOSCOPY (EGD) WITH PROPOFOL;  Surgeon: Dolores Frame, MD;  [facility];  Service: Gastroenterology;  Laterality: N/A;  10:30AM;ASA 3   FLEXIBLE SIGMOIDOSCOPY  01/17/2012   Procedure: FLEXIBLE SIGMOIDOSCOPY;  Surgeon: Malissa Hippo, MD;  [facility];  Service: Endoscopy;  Laterality: N/A;   GIVENS CAPSULE STUDY N/A 08/03/2019   Procedure: GIVENS CAPSULE STUDY (performed for IDA)->some food debris in stomach and small amount of blood.  Surgeon: Malissa Hippo, MD;  [facility];  Service: Endoscopy;  Laterality: N/A;  730AM   HEMILAMINOTOMY LUMBAR SPINE Bilateral 09/07/1999   L4-5   HOT HEMOSTASIS  09/25/2023   Procedure: HOT HEMOSTASIS (ARGON PLASMA COAGULATION/BICAP);  Surgeon: Marguerita Merles, Reuel Boom, MD;  [facility];  Service: Gastroenterology;;   KNEE ARTHROSCOPY Right 01/1999; 10/2000   LEFT ATRIAL APPENDAGE OCCLUSION N/A 01/23/2023   Procedure: LEFT ATRIAL APPENDAGE OCCLUSION;  Surgeon: [name], MD;  Location: [facility]VAS[facility] CV LAB;  Service: Cardiovascular;  Laterality: N/A;   LEFT ATRIAL APPENDAGE OCCLUSION     LYSIS OF ADHESION  02/10/2001   MALONEY DILATION  09/27/2019   Procedure: MALONEY DILATION;  Surgeon: Malissa Hippo, MD;  [facility];  Service: Endoscopy;;   NASAL ENDOSCOPY WITH EPISTAXIS CONTROL Bilateral 01/25/2022   Procedure: NASAL ENDOSCOPY WITH EPISTAXIS CONTROL;  Surgeon: Osborn Coho, MD;  Location: [facility] OR;  Service: ENT;  Laterality: Bilateral;   NASAL SEPTOPLASTY W/ TURBINOPLASTY Bilateral 01/25/2022   Procedure: NASAL SEPTOPLASTY WITH TURBINATE REDUCTION;  Surgeon: Osborn Coho, MD;  Location: [facility] OR;  Service: ENT;  Laterality: Bilateral;   RECTOCELE REPAIR  1990;  09/12/2006   TARSAL TUNNEL RELEASE  2002   TEE WITHOUT CARDIOVERSION N/A 01/23/2023   Procedure: TRANSESOPHAGEAL ECHOCARDIOGRAM;  Surgeon: [name], MD;  Location: [facility] INVASIVE CV LAB;  Service: Cardiovascular;  Laterality: N/A;   TRANSESOPHAGEAL ECHOCARDIOGRAM (CATH LAB) N/A 08/26/2023   4mm leak around watchman device. Procedure: TRANSESOPHAGEAL ECHOCARDIOGRAM;  Surgeon: [name], MD;  Location: [facility] INVASIVE CV LAB;  Service: Cardiovascular;  Laterality: N/A;   TRANSTHORACIC ECHOCARDIOGRAM  08/04/2015   EF 60-65%, normal wall motion, mild LVH, mild LA dilation, grd I DD.  11/2023 overall poor quality, EF 55-60%   TUMOR EXCISION Left 03/21/2003   dorsal 1st web space (hand)   URETEROLYSIS Right 02/10/2001    MEDS:  Outpatient Medications Prior to Visit  Medication Sig Dispense Refill   Accu-Chek Softclix Lancets lancets USE TO CHECK BLOOD GLUCOSE  UP TO 6 TIMES DAILY AS DIRECTED 200 each 5   acetaminophen (TYLENOL) 500 MG tablet Take 1,000 mg by mouth every 6 (six) hours as needed for mild pain (pain score 1-3).     aspirin EC 81 MG tablet Take 1 tablet (81 mg total) by mouth daily. Swallow whole.     blood glucose meter kit and supplies KIT Use up to six times daily as directed. DX. E11.9 1 each 0   canagliflozin (INVOKANA) 100 MG TABS tablet Take 1 tablet (100 mg total) by mouth daily before breakfast. 90 tablet 1   citalopram (CELEXA) 20 MG tablet Take 1 tablet (20 mg total) by mouth daily. 90 tablet 1   Dulaglutide (TRULICITY) 1.5 MG/0.5ML SOAJ INJECT 1.5 MG (0.5ML) UNDER THE SKIN ONCE A WEEK 18 mL 0   esomeprazole (NEXIUM) 40 MG capsule Take 1 capsule (40 mg total) by mouth daily before breakfast. 90 capsule 3   furosemide (LASIX) 20 MG tablet Take 3 tablets (60 mg total) by mouth 2 (two) times daily. 540 tablet 0   gabapentin (NEURONTIN) 600 MG tablet TAKE 1 AND 1/2 TABLETS BY MOUTH TWICE A DAY 270 tablet 1   glucose blood (ACCU-CHEK GUIDE) test strip USE TO CHECK BLOOD  GLUCOSE UP TO 6 TIMES DAILY AS DIRECTED 600 strip 3   Insulin Pen Needle (B-D UF III MINI PEN NEEDLES) 31G X 5 MM MISC USE TO INJECT INSULINS EQUAL TO 6 TIMES DAILY. 500 each 6   insulin regular human CONCENTRATED (HUMULIN R U-500 KWIKPEN) 500 UNIT/ML KwikPen Inject 100 units into the skin at breakfast and lunch. 48 mL 1   lactase (LACTAID) 3000 UNITS tablet Take 3,000 Units by mouth as needed (when eating foods containing dairy).      meclizine (ANTIVERT) 25 MG tablet Take 1 tablet (25 mg total) by mouth 3 (three) times daily as needed for dizziness or nausea. 90 tablet 2   metoprolol succinate (TOPROL-XL) 100 MG 24 hr tablet TAKE 1 TABLET BY MOUTH 2 TIMES DAILY. TAKE WITH OR IMMEDIATELY FOLLOWING A MEAL 180 tablet 1   potassium chloride (KLOR-CON M10) 10 MEQ tablet Take 1 tablet (10 mEq total) by mouth daily. (Patient taking differently: Take 20 mEq by mouth daily.) 90 tablet 1   diphenoxylate-atropine (LOMOTIL) 2.5-0.025 MG tablet Take 1 tablet by mouth 4 (four) times daily as needed for diarrhea or loose stools. (Patient not taking: Reported on 11/07/2023) 60 tablet 2   HYDROcodone-acetaminophen (NORCO/VICODIN) 5-325 MG tablet 1-2 tabs po bid prn pain (Patient not taking: Reported on 11/07/2023) 60 tablet 0   INFLIXIMAB IV Inject into the vein every 8 (eight) weeks. Remicaid 5mg /kg Infusion every 8 weeks [facility]) (Patient not taking: Reported on 11/07/2023)     oxybutynin (DITROPAN XL) 5 MG 24 hr tablet Take 1 tablet (5 mg total) by mouth at bedtime. (Patient not taking: Reported on 10/31/2023) 30 tablet 0   No facility-administered medications prior to visit.    Physical Exam    11/17/2023    2:57 PM 11/14/2023   12:08 PM 11/14/2023   11:39 AM  Vitals with BMI  Height 5\' 2"   5\' 2"   Weight 222 lbs  220 lbs 6 oz  BMI 40.59  40.3  Systolic 106  103  Diastolic 67  65  Pulse 69 88 101   Gen: Alert, well appearing.  Patient is oriented to person, place, time, and  situation. AFFECT: pleasant, lucid thought and speech. CV: RRR, no m/r/g.  LUNGS: CTA bilat, nonlabored resps, good aeration in all lung fields. EXT: 1+ bilat LE pitting edema.  Also much more prominent amount of nonpitting edema both LL's, symmetric.   Pertinent labs/imaging Last CBC Lab Results  Component Value Date   WBC 6.0 11/02/2023   HGB 8.4 (L) 11/02/2023   HCT 29.9 (L) 11/02/2023   MCV 86.4 11/02/2023   MCH 24.3 (L) 11/02/2023   RDW 19.7 (H) 11/02/2023   PLT 172 11/02/2023   Lab Results  Component Value Date   IRON 28 (L) 08/22/2023   TIBC 335 08/22/2023   FERRITIN 6 (L) 08/22/2023   Last metabolic panel Lab Results  Component Value Date   GLUCOSE 66 (L) 11/05/2023   NA 137 11/05/2023   K 3.9 11/05/2023   CL 99 11/05/2023   CO2 31 11/05/2023   BUN 14 11/05/2023   CREATININE 0.66 11/05/2023   GFRNONAA >60 11/05/2023   CALCIUM 8.5 (L) 11/05/2023   PROT 6.1 (L) 11/03/2023   ALBUMIN 2.9 (L) 09/19/2023   LABGLOB 3.4 08/17/2015   AGRATIO 1.0 08/17/2015   BILITOT 1.0 09/19/2023   ALKPHOS 63 09/19/2023   AST 38 09/19/2023   ALT 20 09/19/2023   ANIONGAP 7 11/05/2023   Last lipids Lab Results  Component Value Date   CHOL 149 05/06/2023   HDL 38 (L) 05/06/2023   LDLCALC 89 05/06/2023   LDLDIRECT 69.5 01/24/2011   TRIG 123 05/06/2023   CHOLHDL 3.9 05/06/2023   Last hemoglobin A1c Lab Results  Component Value Date   HGBA1C 8.5 (A) 05/29/2023   HGBA1C 8.5 05/29/2023   HGBA1C 8.5 (A) 05/29/2023   HGBA1C 8.5 (A) 05/29/2023   Last thyroid functions Lab Results  Component Value Date   TSH 0.618 10/09/2022   ASSESSMENT/PLAN:  #1 acute on chronic diastolic heart failure. Stable status post hospital treatment. Continue Lasix 60 mg twice daily, Toprol-XL 100 mg twice daily, and Klor-Con 10 mEq daily. Continue best efforts at limiting sodium and fluids. Monitor basic metabolic panel today.  #2 poorly controlled diabetes. POC Hba1c 7.7% today,  ?accuracy given inc RBC turnover/anemia. Feet exam normal today. She was not able to give a urine specimen for microalbumin/creatinine today.  3.  Chronic iron deficiency anemia. Due to chronic malabsorption and also a history of recurrent severe epistaxis while she was on DOAC. Monitor CBC and iron panel today.  Medical decision making of moderate complexity was utilized today.  FOLLOW UP: 4 weeks  Signed:  Santiago Bumpers, MD           11/17/2023

## 2023-11-17 NOTE — Patient Outreach (Signed)
  Care Management  Transitions of Care Program Transitions of Care Post-discharge week 3  11/17/2023 Name: [name] MRN: [id number] DOB: 1946-02-05  Subjective: [name] is a 78 y.o. year old female who is a primary care patient of McGowen, Maryjean Morn, MD. The Care Management team was unable to reach the patient by phone to assess and address transitions of care needs.   Plan: No further outreach attempts will be made at this time.  We have been unable to reach the patient.Unable to make contact for follow up and case closed.   Lonia Chimera, RN, BSN, CEN [employer]- Transition of Care Team.  [facility] [phone]

## 2023-11-18 LAB — RETICULOCYTES
ABS Retic: 84720 {cells}/uL — ABNORMAL HIGH (ref 20000–80000)
Retic Ct Pct: 2.4 %

## 2023-11-18 LAB — BASIC METABOLIC PANEL
BUN: 18 mg/dL (ref 6–23)
CO2: 31 meq/L (ref 19–32)
Calcium: 8.2 mg/dL — ABNORMAL LOW (ref 8.4–10.5)
Chloride: 100 meq/L (ref 96–112)
Creatinine, Ser: 0.93 mg/dL (ref 0.40–1.20)
GFR: 59.81 mL/min — ABNORMAL LOW (ref >60.00)
Glucose, Bld: 339 mg/dL — ABNORMAL HIGH (ref 70–99)
Potassium: 3.6 meq/L (ref 3.5–5.1)
Sodium: 139 meq/L (ref 135–145)

## 2023-11-18 LAB — CBC
HCT: 29.7 % — ABNORMAL LOW (ref 36.0–46.0)
Hemoglobin: 9 g/dL — ABNORMAL LOW (ref 12.0–15.0)
MCHC: 30.1 g/dL (ref 30.0–36.0)
MCV: 82.6 fL (ref 78.0–100.0)
Platelets: 152 10*3/uL (ref 150.0–400.0)
RBC: 3.6 Mil/uL — ABNORMAL LOW (ref 3.87–5.11)
RDW: 20.2 % — ABNORMAL HIGH (ref 11.5–15.5)
WBC: 5 10*3/uL (ref 4.0–10.5)

## 2023-11-18 LAB — IRON,TIBC AND FERRITIN PANEL
%SAT: 16 % (ref 16–45)
Ferritin: 21 ng/mL (ref 16–288)
Iron: 53 ug/dL (ref 45–160)
TIBC: 328 ug/dL (ref 250–450)

## 2023-11-18 LAB — POCT GLYCOSYLATED HEMOGLOBIN (HGB A1C)
HbA1c POC (<> result, manual entry): 7.7 % (ref 4.0–5.6)
HbA1c, POC (controlled diabetic range): 7.7 % — AB (ref 0.0–7.0)
HbA1c, POC (prediabetic range): 7.7 % — AB (ref 5.7–6.4)
Hemoglobin A1C: 7.7 % — AB (ref 4.0–5.6)

## 2023-11-19 ENCOUNTER — Ambulatory Visit: Payer: Self-pay | Admitting: Family Medicine

## 2023-11-19 DIAGNOSIS — I110 Hypertensive heart disease with heart failure: Secondary | ICD-10-CM | POA: Diagnosis not present

## 2023-11-19 DIAGNOSIS — I5033 Acute on chronic diastolic (congestive) heart failure: Secondary | ICD-10-CM | POA: Diagnosis not present

## 2023-11-19 DIAGNOSIS — I480 Paroxysmal atrial fibrillation: Secondary | ICD-10-CM | POA: Diagnosis not present

## 2023-11-19 DIAGNOSIS — E1142 Type 2 diabetes mellitus with diabetic polyneuropathy: Secondary | ICD-10-CM | POA: Diagnosis not present

## 2023-11-19 DIAGNOSIS — K7460 Unspecified cirrhosis of liver: Secondary | ICD-10-CM | POA: Diagnosis not present

## 2023-11-19 DIAGNOSIS — K7581 Nonalcoholic steatohepatitis (NASH): Secondary | ICD-10-CM | POA: Diagnosis not present

## 2023-11-19 DIAGNOSIS — F32A Depression, unspecified: Secondary | ICD-10-CM | POA: Diagnosis not present

## 2023-11-19 DIAGNOSIS — F411 Generalized anxiety disorder: Secondary | ICD-10-CM | POA: Diagnosis not present

## 2023-11-19 DIAGNOSIS — E1165 Type 2 diabetes mellitus with hyperglycemia: Secondary | ICD-10-CM | POA: Diagnosis not present

## 2023-11-19 NOTE — Telephone Encounter (Signed)
FYI pt is scheduled for follow up visit on 2/26

## 2023-11-19 NOTE — Telephone Encounter (Signed)
Chief Complaint: Fall Symptoms: Back/bottom pain Frequency: Intermittent Pertinent Negatives: Patient denies hitting head, bruise, open skin Disposition: [] ED /[] Urgent Care (no appt availability in office) / [x] Appointment(In office/virtual)/ []  [place] [facility]/ [] Home Care/ [] Refused Recommended Disposition /[] [place] [facility]/ []  Follow-up with PCP Additional Notes: Pt states she lost her balance getting into bed [name] few weeks ago. Pt denies hitting her head. Pt hit her back/bottom and went to ED. Pt states she had an x-ray there that showed no signs of [name] fracture. Pt states she has intermittent pain in her back/bottom area. Pt scheduled for [name] hospital follow up appt with PCP on 2/26. This RN educated pt on home care, new-worsening symptoms, when to call back/seek emergent care. Pt verbalized understanding and agrees to plan.    Copied from CRM [id number]. Topic: Clinical - Red Word Triage >> Nov 19, 2023 10:47 AM [name] wrote: [facility] that prompted transfer to Nurse Triage: [name], OT from [facility] care states patient is complaining of level 8/10 back and bottom area pain (had [name] fall [name] few weeks ago). [name] is currently not with the patient (declined nurse triage) but wanted to inform her doctor & requesting for [name] nurse to call the patient. Reason for Disposition  [1] Fall AND [2] went to emergency department for evaluation or treatment  Answer Assessment - Initial Assessment Questions 1. MECHANISM: "How did the fall happen?"     Getting into bed 3. ONSET: "When did the fall happen?" (e.g., minutes, hours, or days ago)     [name] few weeks ago 4. LOCATION: "What part of the body hit the ground?" (e.g., back, buttocks, head, hips, knees, hands, head, stomach)    Butt 6. PAIN: "Is there any pain?" If Yes, ask: "How bad is the pain?" (e.g., Scale 1-10; or mild,  moderate, severe)   - NONE (0): No pain   - MILD (1-3): Doesn't interfere with normal activities    - MODERATE  (4-7): Interferes with normal activities or awakens from sleep    - SEVERE (8-10): Excruciating pain, unable to do any normal activities      7,  comes and goes 7. SIZE: For cuts, bruises, or swelling, ask: "How large is it?" (e.g., inches or centimeters)      Denies cuts, bruises, swelling 9. OTHER SYMPTOMS: "Do you have any other symptoms?" (e.g., dizziness, fever, weakness; new onset or worsening).      Weakness when sitting down or going to stand up  Protocols used: Falls and [facility]

## 2023-11-20 ENCOUNTER — Ambulatory Visit (HOSPITAL_COMMUNITY): Payer: Medicare PPO

## 2023-11-20 ENCOUNTER — Telehealth: Payer: Self-pay

## 2023-11-20 DIAGNOSIS — K7460 Unspecified cirrhosis of liver: Secondary | ICD-10-CM | POA: Diagnosis not present

## 2023-11-20 DIAGNOSIS — I5033 Acute on chronic diastolic (congestive) heart failure: Secondary | ICD-10-CM | POA: Diagnosis not present

## 2023-11-20 DIAGNOSIS — I110 Hypertensive heart disease with heart failure: Secondary | ICD-10-CM | POA: Diagnosis not present

## 2023-11-20 DIAGNOSIS — E1142 Type 2 diabetes mellitus with diabetic polyneuropathy: Secondary | ICD-10-CM | POA: Diagnosis not present

## 2023-11-20 DIAGNOSIS — F32A Depression, unspecified: Secondary | ICD-10-CM | POA: Diagnosis not present

## 2023-11-20 DIAGNOSIS — I480 Paroxysmal atrial fibrillation: Secondary | ICD-10-CM | POA: Diagnosis not present

## 2023-11-20 DIAGNOSIS — K7581 Nonalcoholic steatohepatitis (NASH): Secondary | ICD-10-CM | POA: Diagnosis not present

## 2023-11-20 DIAGNOSIS — E1165 Type 2 diabetes mellitus with hyperglycemia: Secondary | ICD-10-CM | POA: Diagnosis not present

## 2023-11-20 DIAGNOSIS — K3189 Other diseases of stomach and duodenum: Secondary | ICD-10-CM

## 2023-11-20 DIAGNOSIS — M170 Bilateral primary osteoarthritis of knee: Secondary | ICD-10-CM

## 2023-11-20 DIAGNOSIS — F411 Generalized anxiety disorder: Secondary | ICD-10-CM | POA: Diagnosis not present

## 2023-11-20 DIAGNOSIS — G608 Other hereditary and idiopathic neuropathies: Secondary | ICD-10-CM

## 2023-11-20 NOTE — Telephone Encounter (Signed)
Provider given HH orders to sign

## 2023-11-20 NOTE — Telephone Encounter (Signed)
Home health orders received 11/19/23 for [name] [facility] health initiation orders: Yes.  Home health re-certification orders: No. Patient last seen by ordering physician for this condition: 11/18/23. Must be less than 90 days for re-certification and less than 30 days prior for initiation. Visit must have been for the condition the orders are being placed.  Patient meets criteria for Physician to sign orders: Yes.        Current med list has been attached: Yes        Orders placed on physicians desk for signature: 11/20/23 (date) If patient does not meet criteria for orders to be signed: pt was called to schedule appt. Appt is scheduled for n/a.   Filomena Jungling

## 2023-11-20 NOTE — Telephone Encounter (Signed)
Signed and put in box to go up front. Signed:  Santiago Bumpers, MD           11/20/2023

## 2023-11-20 NOTE — Telephone Encounter (Signed)
Copied from CRM [id number]. Topic: Clinical - Medical Advice >> Nov 20, 2023  4:36 PM Corin V wrote: Reason for CRM: Patient has a diagnosis of heart failure and is not being prescribed a medication for that. She stated an ace inhibitor ar ARB would be recommended for this diagnosis. Please send in a script if desired.    Please review and advise.

## 2023-11-20 NOTE — Telephone Encounter (Signed)
Yes, okay.

## 2023-11-23 ENCOUNTER — Other Ambulatory Visit: Payer: Self-pay | Admitting: Family Medicine

## 2023-11-25 ENCOUNTER — Ambulatory Visit (HOSPITAL_COMMUNITY)
Admission: RE | Admit: 2023-11-25 | Discharge: 2023-11-25 | Disposition: A | Discharge status: Home / Self Care | Payer: Medicare PPO | Source: Ambulatory Visit | Attending: Family Medicine | Admitting: Family Medicine

## 2023-11-25 DIAGNOSIS — M533 Sacrococcygeal disorders, not elsewhere classified: Secondary | ICD-10-CM | POA: Diagnosis not present

## 2023-11-25 DIAGNOSIS — M47817 Spondylosis without myelopathy or radiculopathy, lumbosacral region: Secondary | ICD-10-CM | POA: Diagnosis not present

## 2023-11-25 DIAGNOSIS — M461 Sacroiliitis, not elsewhere classified: Secondary | ICD-10-CM | POA: Insufficient documentation

## 2023-11-25 DIAGNOSIS — M47816 Spondylosis without myelopathy or radiculopathy, lumbar region: Secondary | ICD-10-CM | POA: Diagnosis not present

## 2023-11-26 ENCOUNTER — Inpatient Hospital Stay: Payer: Medicare PPO | Admitting: Family Medicine

## 2023-11-27 ENCOUNTER — Telehealth: Payer: Self-pay

## 2023-11-27 DIAGNOSIS — K7460 Unspecified cirrhosis of liver: Secondary | ICD-10-CM | POA: Diagnosis not present

## 2023-11-27 DIAGNOSIS — K7581 Nonalcoholic steatohepatitis (NASH): Secondary | ICD-10-CM | POA: Diagnosis not present

## 2023-11-27 DIAGNOSIS — F32A Depression, unspecified: Secondary | ICD-10-CM | POA: Diagnosis not present

## 2023-11-27 DIAGNOSIS — I5033 Acute on chronic diastolic (congestive) heart failure: Secondary | ICD-10-CM | POA: Diagnosis not present

## 2023-11-27 DIAGNOSIS — I480 Paroxysmal atrial fibrillation: Secondary | ICD-10-CM | POA: Diagnosis not present

## 2023-11-27 DIAGNOSIS — E1142 Type 2 diabetes mellitus with diabetic polyneuropathy: Secondary | ICD-10-CM | POA: Diagnosis not present

## 2023-11-27 DIAGNOSIS — E1165 Type 2 diabetes mellitus with hyperglycemia: Secondary | ICD-10-CM | POA: Diagnosis not present

## 2023-11-27 DIAGNOSIS — F411 Generalized anxiety disorder: Secondary | ICD-10-CM | POA: Diagnosis not present

## 2023-11-27 DIAGNOSIS — I110 Hypertensive heart disease with heart failure: Secondary | ICD-10-CM | POA: Diagnosis not present

## 2023-11-27 NOTE — Telephone Encounter (Signed)
 Copied from CRM [id number]. Topic: Clinical - Lab/Test Results >> Nov 27, 2023 12:01 PM [name] wrote: Reason for CRM: Patients husband(Jack) has requested for a nurse to give him a call back to discuss the patients most recent lab results from the testing done on 11/18/2023 Phone#: [phone]   Pt's husband advised MRI has not been read yet but we will follow up once completed.

## 2023-12-02 ENCOUNTER — Telehealth: Payer: Self-pay

## 2023-12-02 DIAGNOSIS — I5033 Acute on chronic diastolic (congestive) heart failure: Secondary | ICD-10-CM | POA: Diagnosis not present

## 2023-12-02 DIAGNOSIS — K7460 Unspecified cirrhosis of liver: Secondary | ICD-10-CM | POA: Diagnosis not present

## 2023-12-02 DIAGNOSIS — F32A Depression, unspecified: Secondary | ICD-10-CM | POA: Diagnosis not present

## 2023-12-02 DIAGNOSIS — E1165 Type 2 diabetes mellitus with hyperglycemia: Secondary | ICD-10-CM | POA: Diagnosis not present

## 2023-12-02 DIAGNOSIS — E1142 Type 2 diabetes mellitus with diabetic polyneuropathy: Secondary | ICD-10-CM | POA: Diagnosis not present

## 2023-12-02 DIAGNOSIS — I480 Paroxysmal atrial fibrillation: Secondary | ICD-10-CM | POA: Diagnosis not present

## 2023-12-02 DIAGNOSIS — K7581 Nonalcoholic steatohepatitis (NASH): Secondary | ICD-10-CM | POA: Diagnosis not present

## 2023-12-02 DIAGNOSIS — I110 Hypertensive heart disease with heart failure: Secondary | ICD-10-CM | POA: Diagnosis not present

## 2023-12-02 DIAGNOSIS — F411 Generalized anxiety disorder: Secondary | ICD-10-CM | POA: Diagnosis not present

## 2023-12-02 NOTE — Telephone Encounter (Signed)
 Copied from CRM [id number]. Topic: General - Other >> Dec 02, 2023 11:06 AM [place] J wrote: Reason for CRM: Angelique Blonder a physical therapist over at home health calling to let provider know that patient had a fall over the weekend and he stated that she did fracture her tailbone. Patient is also in "AFIB".    Pts family has also called to check the results of her recent MRI. Reading room notified

## 2023-12-02 NOTE — Telephone Encounter (Signed)
 Ok, will do result note when MRI interpretation gets to my mailbox.

## 2023-12-03 ENCOUNTER — Ambulatory Visit: Payer: Medicare PPO | Admitting: Family Medicine

## 2023-12-03 ENCOUNTER — Ambulatory Visit: Payer: Self-pay | Admitting: Family Medicine

## 2023-12-03 ENCOUNTER — Telehealth: Payer: Self-pay

## 2023-12-03 DIAGNOSIS — K7460 Unspecified cirrhosis of liver: Secondary | ICD-10-CM | POA: Diagnosis not present

## 2023-12-03 DIAGNOSIS — E1165 Type 2 diabetes mellitus with hyperglycemia: Secondary | ICD-10-CM | POA: Diagnosis not present

## 2023-12-03 DIAGNOSIS — K7581 Nonalcoholic steatohepatitis (NASH): Secondary | ICD-10-CM | POA: Diagnosis not present

## 2023-12-03 DIAGNOSIS — F32A Depression, unspecified: Secondary | ICD-10-CM | POA: Diagnosis not present

## 2023-12-03 DIAGNOSIS — I5033 Acute on chronic diastolic (congestive) heart failure: Secondary | ICD-10-CM | POA: Diagnosis not present

## 2023-12-03 DIAGNOSIS — I110 Hypertensive heart disease with heart failure: Secondary | ICD-10-CM | POA: Diagnosis not present

## 2023-12-03 DIAGNOSIS — F411 Generalized anxiety disorder: Secondary | ICD-10-CM | POA: Diagnosis not present

## 2023-12-03 DIAGNOSIS — E1142 Type 2 diabetes mellitus with diabetic polyneuropathy: Secondary | ICD-10-CM | POA: Diagnosis not present

## 2023-12-03 DIAGNOSIS — I480 Paroxysmal atrial fibrillation: Secondary | ICD-10-CM | POA: Diagnosis not present

## 2023-12-03 NOTE — Telephone Encounter (Signed)
 Copied from CRM [id number]. Topic: Clinical - Medication Question >> Dec 03, 2023  2:16 PM Fredrich Romans wrote: Reason for CRM: Charah from [facility] pharmacy called in to ask is the provider aware that the max daily dose below 201 unit is considered off label ,is provider aware and okay with that  regarding the insulin regular human CONCENTRATED (HUMULIN R U-500 KWIKPEN) 500 UNIT/ML KwikPen?  [phone]

## 2023-12-03 NOTE — Telephone Encounter (Signed)
 Yes, she should be on 100 units of U-500 insulin with breakfast and 100 U with lunch.  None at supper.

## 2023-12-03 NOTE — Telephone Encounter (Signed)
  Chief Complaint: Fall Symptoms: pain in tailbone Frequency: This AM Pertinent Negatives: Patient denies numbness, weakness Disposition: [] ED /[] Urgent Care (no appt availability in office) / [] Appointment(In office/virtual)/ []  [facility]/ [] Home Care/ [] Refused Recommended Disposition /[] [place] [facility]/ [x]  Follow-up with PCP Additional Notes: Pt called in with Misty Stanley with [facility] regarding a fall that pt had this AM. Pt states "I got wrapped up in my blue jeans" when trying to dress and she fell, she notes she was able to twist and land on her bottom, denies head injury. Pt reports the area she had MRI for on Friday felt like it was improving but she is now experiencing 7/10 pain in her tailbone. Pt declines OV. Requests MRI results be called to her once reviewed by provider. This RN educated pt on home care, new-worsening symptoms, when to call back/seek emergent care. Pt verbalized understanding and agrees to plan.    Copied from CRM [id number]. Topic: Clinical - Red Word Triage >> Dec 03, 2023 12:31 PM [place] wrote: Red Word that prompted transfer to Nurse Triage: Kennyth Arnold- OT [facility]- Patient fell today while getting dressed. She fell on her bottom and was recently seen for an MRI and she was doing better but now is in pain again. Does not want to be seen by Dr Milinda Cave, but will speak to a nurse. Reason for Disposition  [1] Caller has NON-URGENT question AND [2] triager unable to answer question  Answer Assessment - Initial Assessment Questions 1. MECHANISM: "How did the fall happen?"     "I got tangled up in my blue jeans" when she was getting dressed  3. ONSET: "When did the fall happen?" (e.g., minutes, hours, or days ago)     This AM 4. LOCATION: "What part of the body hit the ground?" (e.g., back, buttocks, head, hips, knees, hands, head, stomach)     Buttocks 5. INJURY: "Did you hurt (injure) yourself when you fell?" If Yes, ask: "What did you injure?  Tell me more about this?" (e.g., body area; type of injury; pain severity)"     Tailbone 6. PAIN: "Is there any pain?" If Yes, ask: "How bad is the pain?" (e.g., Scale 1-10; or mild,  moderate, severe)   - NONE (0): No pain   - MILD (1-3): Doesn't interfere with normal activities    - MODERATE (4-7): Interferes with normal activities or awakens from sleep    - SEVERE (8-10): Excruciating pain, unable to do any normal activities      7/10 7. SIZE: For cuts, bruises, or swelling, ask: "How large is it?" (e.g., inches or centimeters)      None 9. OTHER SYMPTOMS: "Do you have any other symptoms?" (e.g., dizziness, fever, weakness; new onset or worsening).      None 10. CAUSE: "What do you think caused the fall (or falling)?" (e.g., tripped, dizzy spell)       tripped  Protocols used: Falls and [facility]

## 2023-12-04 ENCOUNTER — Other Ambulatory Visit: Payer: Self-pay | Admitting: Family Medicine

## 2023-12-04 DIAGNOSIS — F411 Generalized anxiety disorder: Secondary | ICD-10-CM | POA: Diagnosis not present

## 2023-12-04 DIAGNOSIS — K7460 Unspecified cirrhosis of liver: Secondary | ICD-10-CM | POA: Diagnosis not present

## 2023-12-04 DIAGNOSIS — I480 Paroxysmal atrial fibrillation: Secondary | ICD-10-CM | POA: Diagnosis not present

## 2023-12-04 DIAGNOSIS — E1142 Type 2 diabetes mellitus with diabetic polyneuropathy: Secondary | ICD-10-CM | POA: Diagnosis not present

## 2023-12-04 DIAGNOSIS — I110 Hypertensive heart disease with heart failure: Secondary | ICD-10-CM | POA: Diagnosis not present

## 2023-12-04 DIAGNOSIS — K7581 Nonalcoholic steatohepatitis (NASH): Secondary | ICD-10-CM | POA: Diagnosis not present

## 2023-12-04 DIAGNOSIS — I5033 Acute on chronic diastolic (congestive) heart failure: Secondary | ICD-10-CM | POA: Diagnosis not present

## 2023-12-04 DIAGNOSIS — E1165 Type 2 diabetes mellitus with hyperglycemia: Secondary | ICD-10-CM | POA: Diagnosis not present

## 2023-12-04 DIAGNOSIS — F32A Depression, unspecified: Secondary | ICD-10-CM | POA: Diagnosis not present

## 2023-12-04 MED ORDER — HYDROCODONE-ACETAMINOPHEN 5-325 MG PO TABS: ORAL_TABLET | ORAL | 0 refills | Status: DC

## 2023-12-04 NOTE — Telephone Encounter (Signed)
 Attempted to call, unable to LVM.

## 2023-12-08 ENCOUNTER — Other Ambulatory Visit: Payer: Self-pay | Admitting: Family Medicine

## 2023-12-09 ENCOUNTER — Other Ambulatory Visit: Payer: Self-pay

## 2023-12-09 ENCOUNTER — Telehealth: Payer: Self-pay

## 2023-12-09 DIAGNOSIS — E1165 Type 2 diabetes mellitus with hyperglycemia: Secondary | ICD-10-CM | POA: Diagnosis not present

## 2023-12-09 DIAGNOSIS — F411 Generalized anxiety disorder: Secondary | ICD-10-CM | POA: Diagnosis not present

## 2023-12-09 DIAGNOSIS — K7581 Nonalcoholic steatohepatitis (NASH): Secondary | ICD-10-CM | POA: Diagnosis not present

## 2023-12-09 DIAGNOSIS — F32A Depression, unspecified: Secondary | ICD-10-CM | POA: Diagnosis not present

## 2023-12-09 DIAGNOSIS — K7460 Unspecified cirrhosis of liver: Secondary | ICD-10-CM | POA: Diagnosis not present

## 2023-12-09 DIAGNOSIS — I110 Hypertensive heart disease with heart failure: Secondary | ICD-10-CM | POA: Diagnosis not present

## 2023-12-09 DIAGNOSIS — I480 Paroxysmal atrial fibrillation: Secondary | ICD-10-CM | POA: Diagnosis not present

## 2023-12-09 DIAGNOSIS — E1142 Type 2 diabetes mellitus with diabetic polyneuropathy: Secondary | ICD-10-CM | POA: Diagnosis not present

## 2023-12-09 DIAGNOSIS — E1140 Type 2 diabetes mellitus with diabetic neuropathy, unspecified: Secondary | ICD-10-CM

## 2023-12-09 DIAGNOSIS — I5033 Acute on chronic diastolic (congestive) heart failure: Secondary | ICD-10-CM | POA: Diagnosis not present

## 2023-12-09 MED ORDER — HUMULIN R U-500 KWIKPEN 500 UNIT/ML ~~LOC~~ SOPN: PEN_INJECTOR | SUBCUTANEOUS | 1 refills | Status: DC

## 2023-12-09 NOTE — Telephone Encounter (Signed)
 Advised patient's husband we have not received forms yet but we will call and provide an update once we do.

## 2023-12-09 NOTE — Telephone Encounter (Signed)
 Copied from CRM [id number]. Topic: General - Other >> Dec 08, 2023 10:59 AM Elizebeth Brooking wrote: Reason for CRM: Patient husband called in stating that he got a form over from [place] and needs some forms signed, is requesting a callback in regards to this issue

## 2023-12-10 NOTE — Telephone Encounter (Signed)
 I called the [facility] office and confirmed the correct fax# [phone]. I will re-fax notes providing clearance. I also s/w the pt and her husband and they are aware pt has been cleared and ok to hold ASA 5-7 days prior, resume ASA once Dr. Jeanice Lim feels it is safe from a hemostasis standpoint.

## 2023-12-10 NOTE — Telephone Encounter (Signed)
 Pt calling stating that dr Jeanice Lim office has not received clearance info  Please follow up and update pt

## 2023-12-11 DIAGNOSIS — F411 Generalized anxiety disorder: Secondary | ICD-10-CM | POA: Diagnosis not present

## 2023-12-11 DIAGNOSIS — E1165 Type 2 diabetes mellitus with hyperglycemia: Secondary | ICD-10-CM | POA: Diagnosis not present

## 2023-12-11 DIAGNOSIS — F32A Depression, unspecified: Secondary | ICD-10-CM | POA: Diagnosis not present

## 2023-12-11 DIAGNOSIS — K7460 Unspecified cirrhosis of liver: Secondary | ICD-10-CM | POA: Diagnosis not present

## 2023-12-11 DIAGNOSIS — I480 Paroxysmal atrial fibrillation: Secondary | ICD-10-CM | POA: Diagnosis not present

## 2023-12-11 DIAGNOSIS — K7581 Nonalcoholic steatohepatitis (NASH): Secondary | ICD-10-CM | POA: Diagnosis not present

## 2023-12-11 DIAGNOSIS — I5033 Acute on chronic diastolic (congestive) heart failure: Secondary | ICD-10-CM | POA: Diagnosis not present

## 2023-12-11 DIAGNOSIS — E1142 Type 2 diabetes mellitus with diabetic polyneuropathy: Secondary | ICD-10-CM | POA: Diagnosis not present

## 2023-12-11 DIAGNOSIS — I110 Hypertensive heart disease with heart failure: Secondary | ICD-10-CM | POA: Diagnosis not present

## 2023-12-15 ENCOUNTER — Ambulatory Visit: Payer: Medicare PPO | Admitting: Family Medicine

## 2023-12-18 DIAGNOSIS — I110 Hypertensive heart disease with heart failure: Secondary | ICD-10-CM | POA: Diagnosis not present

## 2023-12-18 DIAGNOSIS — E1142 Type 2 diabetes mellitus with diabetic polyneuropathy: Secondary | ICD-10-CM | POA: Diagnosis not present

## 2023-12-18 DIAGNOSIS — E1165 Type 2 diabetes mellitus with hyperglycemia: Secondary | ICD-10-CM | POA: Diagnosis not present

## 2023-12-18 DIAGNOSIS — F32A Depression, unspecified: Secondary | ICD-10-CM | POA: Diagnosis not present

## 2023-12-18 DIAGNOSIS — I5033 Acute on chronic diastolic (congestive) heart failure: Secondary | ICD-10-CM | POA: Diagnosis not present

## 2023-12-18 DIAGNOSIS — K7581 Nonalcoholic steatohepatitis (NASH): Secondary | ICD-10-CM | POA: Diagnosis not present

## 2023-12-18 DIAGNOSIS — F411 Generalized anxiety disorder: Secondary | ICD-10-CM | POA: Diagnosis not present

## 2023-12-18 DIAGNOSIS — K7460 Unspecified cirrhosis of liver: Secondary | ICD-10-CM | POA: Diagnosis not present

## 2023-12-18 DIAGNOSIS — I480 Paroxysmal atrial fibrillation: Secondary | ICD-10-CM | POA: Diagnosis not present

## 2023-12-19 ENCOUNTER — Telehealth: Payer: Self-pay

## 2023-12-19 NOTE — Telephone Encounter (Signed)
 FYI. Please see below.     Copied from CRM [id number]. Topic: General - Other >> Dec 18, 2023 11:30 AM Florestine Avers wrote: Reason for CRM: Stacy from [facility] called and wanted to report the patient has gained 5lbs and that she wanted to report it. Kennyth Arnold can be reached at [phone].

## 2023-12-31 DIAGNOSIS — F411 Generalized anxiety disorder: Secondary | ICD-10-CM | POA: Diagnosis not present

## 2023-12-31 DIAGNOSIS — K7460 Unspecified cirrhosis of liver: Secondary | ICD-10-CM | POA: Diagnosis not present

## 2023-12-31 DIAGNOSIS — E1142 Type 2 diabetes mellitus with diabetic polyneuropathy: Secondary | ICD-10-CM | POA: Diagnosis not present

## 2023-12-31 DIAGNOSIS — I110 Hypertensive heart disease with heart failure: Secondary | ICD-10-CM | POA: Diagnosis not present

## 2023-12-31 DIAGNOSIS — K7581 Nonalcoholic steatohepatitis (NASH): Secondary | ICD-10-CM | POA: Diagnosis not present

## 2023-12-31 DIAGNOSIS — E1165 Type 2 diabetes mellitus with hyperglycemia: Secondary | ICD-10-CM | POA: Diagnosis not present

## 2023-12-31 DIAGNOSIS — F32A Depression, unspecified: Secondary | ICD-10-CM | POA: Diagnosis not present

## 2023-12-31 DIAGNOSIS — I480 Paroxysmal atrial fibrillation: Secondary | ICD-10-CM | POA: Diagnosis not present

## 2023-12-31 DIAGNOSIS — I5033 Acute on chronic diastolic (congestive) heart failure: Secondary | ICD-10-CM | POA: Diagnosis not present

## 2024-01-01 ENCOUNTER — Telehealth: Payer: Self-pay

## 2024-01-01 NOTE — Telephone Encounter (Signed)
 Home health orders received 01/01/24 for [name] OT initiation orders: Yes.  Home health re-certification orders: No. Patient last seen by ordering physician for this condition: 11/17/23. Must be less than 90 days for re-certification and less than 30 days prior for initiation. Visit must have been for the condition the orders are being placed.  Patient meets criteria for Physician to sign orders: Yes.        Current med list has been attached: No        Orders placed on physicians desk for signature: 01/01/24 (date) If patient does not meet criteria for orders to be signed: pt was called to schedule appt. Appt is scheduled for N/A with provider.   Desiray Orchard D Latavious Bitter   Placed on PCP desk to review and sign, if appropriate.

## 2024-01-06 ENCOUNTER — Other Ambulatory Visit: Payer: Self-pay | Admitting: Family Medicine

## 2024-01-06 ENCOUNTER — Encounter: Payer: Self-pay | Admitting: Family Medicine

## 2024-01-08 ENCOUNTER — Telehealth: Payer: Self-pay | Admitting: Family Medicine

## 2024-01-08 DIAGNOSIS — I110 Hypertensive heart disease with heart failure: Secondary | ICD-10-CM | POA: Diagnosis not present

## 2024-01-08 DIAGNOSIS — I480 Paroxysmal atrial fibrillation: Secondary | ICD-10-CM | POA: Diagnosis not present

## 2024-01-08 DIAGNOSIS — F32A Depression, unspecified: Secondary | ICD-10-CM | POA: Diagnosis not present

## 2024-01-08 DIAGNOSIS — I5033 Acute on chronic diastolic (congestive) heart failure: Secondary | ICD-10-CM | POA: Diagnosis not present

## 2024-01-08 DIAGNOSIS — E1142 Type 2 diabetes mellitus with diabetic polyneuropathy: Secondary | ICD-10-CM | POA: Diagnosis not present

## 2024-01-08 DIAGNOSIS — K7460 Unspecified cirrhosis of liver: Secondary | ICD-10-CM | POA: Diagnosis not present

## 2024-01-08 DIAGNOSIS — K7581 Nonalcoholic steatohepatitis (NASH): Secondary | ICD-10-CM | POA: Diagnosis not present

## 2024-01-08 DIAGNOSIS — E1165 Type 2 diabetes mellitus with hyperglycemia: Secondary | ICD-10-CM | POA: Diagnosis not present

## 2024-01-08 DIAGNOSIS — F411 Generalized anxiety disorder: Secondary | ICD-10-CM | POA: Diagnosis not present

## 2024-01-08 NOTE — Telephone Encounter (Unsigned)
 Copied from CRM [id number]. Topic: General - Other >> Jan 08, 2024 10:54 AM Darl Householder wrote: Reason for CRM: Kennyth Arnold- home health physical therapist called to report pt weight increase in a week, April 2 she was 220lbs today she is 231.2

## 2024-01-09 ENCOUNTER — Ambulatory Visit: Admitting: Urgent Care

## 2024-01-09 ENCOUNTER — Telehealth: Payer: Self-pay

## 2024-01-09 ENCOUNTER — Ambulatory Visit (HOSPITAL_BASED_OUTPATIENT_CLINIC_OR_DEPARTMENT_OTHER)

## 2024-01-09 ENCOUNTER — Telehealth: Payer: Self-pay | Admitting: Family

## 2024-01-09 ENCOUNTER — Ambulatory Visit: Payer: Self-pay | Admitting: Family Medicine

## 2024-01-09 ENCOUNTER — Ambulatory Visit: Admitting: Family Medicine

## 2024-01-09 ENCOUNTER — Other Ambulatory Visit: Payer: Self-pay | Admitting: Urgent Care

## 2024-01-09 ENCOUNTER — Telehealth: Admitting: Family

## 2024-01-09 ENCOUNTER — Encounter: Payer: Self-pay | Admitting: Urgent Care

## 2024-01-09 VITALS — BP 136/88 | HR 107 | Wt 227.0 lb

## 2024-01-09 DIAGNOSIS — I872 Venous insufficiency (chronic) (peripheral): Secondary | ICD-10-CM

## 2024-01-09 DIAGNOSIS — R0602 Shortness of breath: Secondary | ICD-10-CM

## 2024-01-09 DIAGNOSIS — I890 Lymphedema, not elsewhere classified: Secondary | ICD-10-CM

## 2024-01-09 DIAGNOSIS — M48062 Spinal stenosis, lumbar region with neurogenic claudication: Secondary | ICD-10-CM

## 2024-01-09 DIAGNOSIS — Z794 Long term (current) use of insulin: Secondary | ICD-10-CM

## 2024-01-09 DIAGNOSIS — E1140 Type 2 diabetes mellitus with diabetic neuropathy, unspecified: Secondary | ICD-10-CM | POA: Diagnosis not present

## 2024-01-09 DIAGNOSIS — E669 Obesity, unspecified: Secondary | ICD-10-CM

## 2024-01-09 DIAGNOSIS — J9811 Atelectasis: Secondary | ICD-10-CM | POA: Diagnosis not present

## 2024-01-09 MED ORDER — DAPAGLIFLOZIN PROPANEDIOL 5 MG PO TABS: 5.0000 mg | ORAL_TABLET | Freq: Every day | ORAL | 0 refills | Status: DC

## 2024-01-09 MED ORDER — SPIRONOLACTONE 25 MG PO TABS: 25.0000 mg | ORAL_TABLET | Freq: Every day | ORAL | 0 refills | Status: DC

## 2024-01-09 NOTE — Telephone Encounter (Signed)
 Pt scheduled

## 2024-01-09 NOTE — Telephone Encounter (Signed)
 Copied from CRM [id number]. Topic: Clinical - Red Word Triage >> Jan 09, 2024 10:13 AM [name] wrote: Red Word that prompted transfer to Nurse Triage: Worsening   Chief Complaint: Leg swelling Symptoms: bilateral leg swelling and shortness of breath Frequency: constant, getting worse Pertinent Negatives: Patient denies chest pain Disposition: [x] ED /[] Urgent Care (no appt availability in office) / [] Appointment(In office/virtual)/ []  [facility]/ [] Home Care/ [] Refused Recommended Disposition /[]  Mobile Bus/ []  Follow-up with PCP Additional Notes: Pt reports worsening symptoms and weight gain over the past week, no appts avail today at PCP office. Advised ED,. Pt reluctant. Placed call to [facility], call transferred to [facility] for further eval.   Reason for Disposition  [1] MODERATE leg swelling (e.g., swelling extends up to knees) AND [2] new-onset or worsening  Answer Assessment - Initial Assessment Questions 1. ONSET: "When did the swelling start?" (e.g., minutes, hours, days)     Over the past couple days  2. LOCATION: "What part of the leg is swollen?"  "Are both legs swollen or just one leg?"     Both legs and top of feet  3. SEVERITY: "How bad is the swelling?" (e.g., localized; mild, moderate, severe)   - Localized: Small area of swelling localized to one leg.   - MILD pedal edema: Swelling limited to foot and ankle, pitting edema < 1/4 inch (6 mm) deep, rest and elevation eliminate most or all swelling.   - MODERATE edema: Swelling of lower leg to knee, pitting edema > 1/4 inch (6 mm) deep, rest and elevation only partially reduce swelling.   - SEVERE edema: Swelling extends above knee, facial or hand swelling present.      Moderate  4. REDNESS: "Does the swelling look red or infected?"     No redness   5. PAIN: "Is the swelling painful to touch?" If Yes, ask: "How painful is it?"   (Scale 1-10; mild, moderate or severe)     No pain  6. FEVER: "Do you  have a fever?" If Yes, ask: "What is it, how was it measured, and when did it start?"      Felt "feverish" last night  7. CAUSE: "What do you think is causing the leg swelling?"     Unsure of cause  8. MEDICAL HISTORY: "Do you have a history of blood clots (e.g., DVT), cancer, heart failure, kidney disease, or liver failure?"     CHF  9. RECURRENT SYMPTOM: "Have you had leg swelling before?" If Yes, ask: "When was the last time?" "What happened that time?"     Not this bad, but has had swelling before. Was prescribed lasix 60mg  BID  10. OTHER SYMPTOMS: "Do you have any other symptoms?" (e.g., chest pain, difficulty breathing)       Shortness of breath on exertion  11. PREGNANCY: "Is there any chance you are pregnant?" "When was your last menstrual period?"       no  Protocols used: Leg Swelling and Edema-A-AH

## 2024-01-09 NOTE — Patient Instructions (Signed)
 Stop your Invokana and switch to [place]. Please apply your dexcom sensor and wear for 14 days. This will improve your glucose readings.  You appear to have chronic venous insufficiency and lymphedema I have referred you to the lymphedema clinic. Please continue to take your lasix as prescribed, add spironolactone 25mg  daily. DECREASE your potassium to daily.   I have also referred you to Dr. 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 Please call to schedule an appointment.  Please return within 7-10 days for a recheck. I think you would be a good candidate for Evaristo Bury, a long-acting insulin in place of your regular insulin to prevent night-time hypoglycemic episodes.

## 2024-01-09 NOTE — Telephone Encounter (Signed)
 Reviewed triage Given nature of symptoms I do not feel a virtual visit is appropriate.  Prefer pt to be seen in office where she can be physical examined and I also do agree with triage ED recommendation given heavy cardiac history.

## 2024-01-09 NOTE — Telephone Encounter (Signed)
 ok

## 2024-01-09 NOTE — Telephone Encounter (Signed)
 Patient scheduled for virtual visit with Mort Sawyers, NP.  Due to pt's reported symptoms provider not comfortable seeing pt virtually.  Called and offered in person visit at [facility] but pt reported that our office was too far.  Pt requests phone call from office.  She declined going to ED or Urgent care when I spoke with her.

## 2024-01-09 NOTE — Progress Notes (Unsigned)
 Established Patient Office Visit  Subjective:  Patient ID: [name], female    DOB: April 06, 1946  Age: 78 y.o. MRN: [id number]  Chief Complaint  Patient presents with   Edema    Pt report foot and leg swelling that is painful.    HPI  Discussed the use of AI scribe software for clinical note transcription with the patient, who gave verbal consent to proceed.  History of Present Illness   [name] is a 78 year old female with congestive heart failure who presents with leg weakness and swelling.  She experiences weakness in both legs, describing them as 'giving out' without warning, leading to a sensation of instability. This weakness extends from the waist down and is accompanied by pain and fatigue. This issue has been ongoing for a while but has not been previously evaluated.  She notes swelling in her legs, particularly in the feet, which has worsened since her discharge from the hospital in January. She is currently taking furosemide 60 mg daily, taking three 20mg  tabs in the morning and three in the evening, but has not noticed significant improvement in the swelling. Despite triage notes, patient states the swelling is NOT new.   In January, she was hospitalized for acute congestive heart failure, during which fluid was drawn from her heart. Since then, she has experienced difficulty lying flat at night, requiring two pillows to sleep, but has not needed to increase this number recently. No new or worsening shortness of breath. The SOB she is experiencing today is also NOT new and is at patients baseline.   She has a history of low blood sugar episodes, with levels dropping to the 40s and 50s at night, which sometimes wake her up. She takes 100 units of regular insulin at breakfast and lunch, and additional doses at night if needed. She recalls a severe hypoglycemic episode a year and a half ago that required EMS intervention.  She mentions chronic low back pain and a  history of a lumbar compression fracture, which was identified in January 2024. She has not been following up with a spine specialist since her previous encounter with neurosurgery.      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   Fecal urgency 08/02/2020   Deviated septum 07/28/2020   Epistaxis, recurrent 05/15/2020   Ulcerative colitis without complications (HCC) 08/24/2019   Gastrointestinal hemorrhage 08/24/2019   Iron deficiency anemia due to chronic blood loss 07/28/2019   Iron deficiency anemia 05/20/2019   Heme positive stool 05/20/2019   History of iron deficiency anemia 12/2018   Vertigo 12/04/2015   Uncontrolled type 2 diabetes mellitus with peripheral neuropathy 11/14/2015   OSA (obstructive sleep apnea), severe 09/14/2015   B12 deficiency 08/17/2015   Vitamin B6 deficiency 08/17/2015   PAF (paroxysmal atrial fibrillation) (HCC) 08/17/2015   Chronic anticoagulation 08/17/2015   Atrial fibrillation with RVR (HCC) 08/04/2015   Dysphagia    Gastroesophageal reflux disease without esophagitis    Lymphocytosis  07/04/2015   Obesity 05/18/2014   Allergic sinusitis 12/09/2013   Myalgia 04/28/2012   Polyarthralgia 04/28/2012   Elevated LFTs 01/16/2012   Insomnia 01/12/2012   Small fiber neuropathy 05/30/2011   Anxiety 05/30/2011   Dyslipidemia 01/24/2011   PALPITATIONS, RECURRENT 08/08/2010   Essential hypertension  04/12/2010   GERD 04/12/2010   Ulcerative colitis (HCC) 04/12/2010   Past Medical History:  Diagnosis Date   Carpal tunnel syndrome of right wrist 03/2013   recurrent   Chronic diastolic heart failure (HCC)    Cirrhosis, 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 ? to get liver bx if she gets bariatric surgery? Mild portal hypertensive gastropathy on EGD 08/2019.   Fibromyalgia    GAD (generalized anxiety disorder)    GERD    History of hiatal hernia    History of iron deficiency anemia 12/2018   Inadequate absorption secondary to chronic/long term PPI therapy + portal hypertensive gastroduodenopathy. No GIB found on EGD, colonosc, and givens. Iron infusions X multiple.   History of thrombocytopenia 12/2011   Hyperlipidemia    Intolerant of statins   HYPERTENSION    IBS (irritable bowel syndrome)    -D.  Good response to bentyl and imodium as of 06/2018 GI f/u.   IDDM (insulin dependent diabetes mellitus)    with DPN (managed by Dr. Aldona Amel but then in 2018 pt preferred to have me manage for her convenience)   Limited mobility    Requires a walker for arthritic pain, widespread musculoskeletal pain, and neuropathic pain.   Morbid obesity (HCC)    As of 11/2018, pt considering sleeve gastectomy vs bipass as of eval by Dr. Molli Angelucci considering as of 12/2019.   Nonalcoholic steatohepatitis    Viral Hep screens NEG.  CT 2015.  Transaminasemia.  U/S 07/2018 showed early changes of cirrhosis.   OSA (obstructive sleep apnea) 09/14/2015   sleep study 09/07/15: severe obstructive sleep apnea with an AHI of 72 and SaO2 low of 75%.>referred to sleep MD   Osteoarthritis    hips, shoulders, knees   PAF (paroxysmal atrial fibrillation) (HCC)    One documented episode (after getting EGD 2016).  Was on amiodarone x 3 mo.  Rate control with metoprolol + anticoag with xarelto. Watchman 12/2022, off anticoag   Portal hypertensive gastropathy (HCC)     hemorrhagic gastropathy + non-bleeding gastric ulcers on EGD 08/2023   Recurrent epistaxis    Granuloma in L nare cauterized by ENT 04/2020. Another cautery 06/2020   Small fiber neuropathy    Due to DM.  Symmetric hands and feet tingling/numbness.   Ulcerative colitis (HCC)    Remicade infusion Q 8 weeks: in clinical and endoscopic remission as of 12/2018 GI f/u.  06/11/19 rpt colonoscopy->cecal and ascending colon colitis.   Past Surgical History:  Procedure Laterality Date   ABDOMINAL HYSTERECTOMY  1980   Paps no longer indicated.   BACK SURGERY     BACTERIAL OVERGROWTH TEST N/A 07/13/2015   Procedure: BACTERIAL OVERGROWTH TEST;  Surgeon: Ruby Corporal, MD;  [facility];  Service: Endoscopy;  Laterality: N/A;  730     BILATERAL SALPINGOOPHORECTOMY  02/10/2001   BIOPSY  06/11/2019   Procedure: BIOPSY;  Surgeon: Ruby Corporal, MD;  [facility];  Service: Endoscopy;;  colon   BIOPSY  09/27/2019   Procedure: BIOPSY;  Surgeon: Ruby Corporal, MD;  [facility];  Service: Endoscopy;;  gastric duodenal   BIOPSY  09/15/2020   Procedure: BIOPSY;  Surgeon: Urban Garden, MD;  [facility];  Service: Gastroenterology;;   BIOPSY  09/25/2023   Procedure: BIOPSY;  Surgeon: Urban Garden, MD;  [facility];  Service: Gastroenterology;;   BREAST REDUCTION SURGERY  1994   bilat   BREAST SURGERY     CARDIOVASCULAR STRESS TEST  07/2010   Lexiscan myoview: normal   CARPAL TUNNEL RELEASE Right 1996   CARPAL TUNNEL RELEASE Left 03/21/2003   CARPAL TUNNEL RELEASE Right 05/04/2013   Procedure: CARPAL TUNNEL RELEASE;  Surgeon: Amelie Baize., MD;  Location: [facility];  Service: Orthopedics;  Laterality: Right;   CARPAL TUNNEL RELEASE Left 09/21/2013   Procedure: LEFT CARPAL TUNNEL RELEASE;  Surgeon: Amelie Baize., MD;  Location: [facility];  Service: Orthopedics;  Laterality:  Left;   CHOLECYSTECTOMY     COLONOSCOPY WITH PROPOFOL N/A 08/04/2015   Colitis in remission.  No polyps.  Procedure: COLONOSCOPY WITH PROPOFOL;  Surgeon: Ruby Corporal, MD;  [facility]: [facility];  Service: Endoscopy;  Laterality: N/A;  cecum time in  0820   time out  0827    total time 7 minutes   COLONOSCOPY WITH PROPOFOL N/A 06/11/2019   cecal and ascending colon colitis.  Procedure: COLONOSCOPY WITH PROPOFOL;  Surgeon: Ruby Corporal, MD;  [facility];  Service: Endoscopy;  Laterality: N/A;  730a   COLONOSCOPY WITH PROPOFOL N/A 09/15/2020   Procedure: COLONOSCOPY WITH PROPOFOL;  Surgeon: Urban Garden, MD;  [facility];  Service: Gastroenterology;  Laterality: N/A;  1030   ESOPHAGEAL DILATION N/A 08/04/2015   Procedure: ESOPHAGEAL DILATION;  Surgeon: Ruby Corporal, MD;  [facility]: [facility];  Service: Endoscopy;  Laterality: N/AFelicita Horns, no mucousal disruption   ESOPHAGOGASTRODUODENOSCOPY  09/27/2019   Performed for IDA.  Esoph dilation was done but no stricture present.  Mild portal hypertensive gastropathy, o/w normal.  Duodenal bx NEG.  h pylori neg.   ESOPHAGOGASTRODUODENOSCOPY     09/25/2023 inactive chronic gastritis, h pylor neg. benign   ESOPHAGOGASTRODUODENOSCOPY     09/25/23 hemorrhagic portal gastropathy + nonbleeding gastric ulcers (H pylori neg)   ESOPHAGOGASTRODUODENOSCOPY (EGD) WITH ESOPHAGEAL DILATION  12/02/2005   ESOPHAGOGASTRODUODENOSCOPY (EGD) WITH PROPOFOL N/A 08/04/2015   Procedure: ESOPHAGOGASTRODUODENOSCOPY (EGD) WITH PROPOFOL;  Surgeon: Ruby Corporal, MD;  [facility];  Service: Endoscopy;  Laterality: N/A;  procedure 1   ESOPHAGOGASTRODUODENOSCOPY (EGD) WITH PROPOFOL N/A 09/27/2019   Procedure: ESOPHAGOGASTRODUODENOSCOPY (EGD) WITH PROPOFOL;  Surgeon: Ruby Corporal, MD;  [facility];  Service: Endoscopy;  Laterality: N/A;  12:10   ESOPHAGOGASTRODUODENOSCOPY (EGD) WITH PROPOFOL N/A 10/31/2021   Procedure:  ESOPHAGOGASTRODUODENOSCOPY (EGD) WITH PROPOFOL;  Surgeon: Ruby Corporal, MD;  [facility];  Service: Endoscopy;  Laterality: N/A;  930   ESOPHAGOGASTRODUODENOSCOPY (EGD) WITH PROPOFOL N/A 09/25/2023   Procedure: ESOPHAGOGASTRODUODENOSCOPY (EGD) WITH PROPOFOL;  Surgeon: Urban Garden, MD;  [facility];  Service: Gastroenterology;  Laterality: N/A;  10:30AM;ASA 3   FLEXIBLE SIGMOIDOSCOPY  01/17/2012   Procedure: FLEXIBLE SIGMOIDOSCOPY;  Surgeon: Ruby Corporal, MD;  [facility];  Service: Endoscopy;  Laterality: N/A;   GIVENS CAPSULE STUDY N/A 08/03/2019   Procedure: GIVENS CAPSULE STUDY (performed for IDA)->some food debris in stomach and small amount of blood.  Surgeon: Ruby Corporal, MD;  [facility];  Service: Endoscopy;  Laterality: N/A;  730AM  HEMILAMINOTOMY LUMBAR SPINE Bilateral 09/07/1999   L4-5   HOT HEMOSTASIS  09/25/2023   Procedure: HOT HEMOSTASIS (ARGON PLASMA COAGULATION/BICAP);  Surgeon: Umberto Ganong, Bearl Limes, MD;  [facility];  Service: Gastroenterology;;   KNEE ARTHROSCOPY Right 01/1999; 10/2000   LEFT ATRIAL APPENDAGE OCCLUSION N/A 01/23/2023   Procedure: LEFT ATRIAL APPENDAGE OCCLUSION;  Surgeon: Arnoldo Lapping, MD;  Location: [facility] INVAS[facility] CV LAB;  Service: Cardiovascular;  Laterality: N/A;   LEFT ATRIAL APPENDAGE OCCLUSION     LYSIS OF ADHESION  02/10/2001   MALONEY DILATION  09/27/2019   Procedure: MALONEY DILATION;  Surgeon: Ruby Corporal, MD;  [facility];  Service: Endoscopy;;   NASAL ENDOSCOPY WITH EPISTAXIS CONTROL Bilateral 01/25/2022   Procedure: NASAL ENDOSCOPY WITH EPISTAXIS CONTROL;  Surgeon: Ammon Bales, MD;  Location: [facility] OR;  Service: ENT;  Laterality: Bilateral;   NASAL SEPTOPLASTY W/ TURBINOPLASTY Bilateral 01/25/2022   Procedure: NASAL SEPTOPLASTY WITH TURBINATE REDUCTION;  Surgeon: Ammon Bales, MD;  Location: [facility] OR;  Service: ENT;  Laterality: Bilateral;    RECTOCELE REPAIR  1990; 09/12/2006   TARSAL TUNNEL RELEASE  2002   TEE WITHOUT CARDIOVERSION N/A 01/23/2023   Procedure: TRANSESOPHAGEAL ECHOCARDIOGRAM;  Surgeon: Arnoldo Lapping, MD;  Location[facility]VAS[facility] CV LAB;  Service: Cardiovascular;  Laterality: N/A;   TRANSESOPHAGEAL ECHOCARDIOGRAM (CATH LAB) N/A 08/26/2023   4mm leak around watchman device. Procedure: TRANSESOPHAGEAL ECHOCARDIOGRAM;  Surgeon: Euell Herrlich, MD;  Location: [facility] INVASIVE CV LAB;  Service: Cardiovascular;  Laterality: N/A;   TRANSTHORACIC ECHOCARDIOGRAM  08/04/2015   EF 60-65%, normal wall motion, mild LVH, mild LA dilation, grd I DD.  11/2023 overall poor quality, EF 55-60%   TUMOR EXCISION Left 03/21/2003   dorsal 1st web space (hand)   URETEROLYSIS Right 02/10/2001   Social History   Socioeconomic History   Marital status: Married    Spouse name: Not on file   Number of children: Not on file   Years of education: Not on file   Highest education level: Not on file  Occupational History   Occupation: Retired  Tobacco Use   Smoking status: Never   Smokeless tobacco: Never  Vaping Use   Vaping status: Never Used  Substance and Sexual Activity   Alcohol use: Not Currently    Comment: ocassionally   Drug use: No   Sexual activity: Not Currently    Partners: Male    Birth control/protection: Surgical    Comment: hysterectomy  Other Topics Concern   Not on file  Social History Narrative   She lives with husband in [place] home.  They have one grown son and 2 grandchildren.   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   Highest of level education:  Some college.   Never smoker.   Alcohol: rare.   Social Drivers of Corporate investment banker Strain: Low Risk  (01/15/2023)   Overall Financial Resource Strain (CARDIA)    Difficulty of Paying Living Expenses: Not hard at all  Food Insecurity: No Food Insecurity (11/07/2023)   Hunger Vital Sign    Worried About Running Out of Food in the Last Year: Never  true    Ran Out of Food in the Last Year: Never true  Transportation Needs: No Transportation Needs (11/07/2023)   PRAPARE - Administrator, Civil Service (Medical): No    Lack of Transportation (Non-Medical): No  Physical Activity: Inactive (01/15/2023)   Exercise Vital Sign    Days of Exercise per Week: 0 days  Minutes of Exercise per Session: 0 min  Stress: No Stress Concern Present (01/15/2023)   [employer] of Occupational Health - Occupational Stress Questionnaire    Feeling of Stress : Not at all  Social Connections: Moderately Isolated (10/31/2023)   Social Connection and Isolation Panel [NHANES]    Frequency of Communication with Friends and Family: More than three times a week    Frequency of Social Gatherings with Friends and Family: More than three times a week    Attends Religious Services: Never    Database administrator or Organizations: No    Attends Banker Meetings: Never    Marital Status: Married  Catering manager Violence: Not At Risk (11/07/2023)   Humiliation, Afraid, Rape, and Kick questionnaire    Fear of Current or Ex-Partner: No    Emotionally Abused: No    Physically Abused: No    Sexually Abused: No      ROS: as noted in HPI  Objective:     BP 136/88   Pulse (!) 107   Wt 227 lb (103 kg)   SpO2 94%   BMI 41.52 kg/m  BP Readings from Last 3 Encounters:  01/09/24 136/88  11/17/23 106/67  11/14/23 103/65   Wt Readings from Last 3 Encounters:  01/09/24 227 lb (103 kg)  11/17/23 222 lb (100.7 kg)  11/14/23 220 lb 6.4 oz (100 kg)      Physical Exam Vitals and nursing note reviewed. Exam conducted with a chaperone present.  Constitutional:      General: She is not in acute distress.    Appearance: Normal appearance. She is obese. She is not ill-appearing, toxic-appearing or diaphoretic.  HENT:     Head: Normocephalic and atraumatic.  Eyes:     General: No scleral icterus.       Right eye: No discharge.         Left eye: No discharge.     Extraocular Movements: Extraocular movements intact.     Pupils: Pupils are equal, round, and reactive to light.  Cardiovascular:     Rate and Rhythm: Normal rate and regular rhythm.  Pulmonary:     Effort: Pulmonary effort is normal. No respiratory distress.     Breath sounds: Normal breath sounds. No stridor. No wheezing, rhonchi or rales.  Musculoskeletal:     Cervical back: No tenderness.     Right lower leg: Edema present.     Left lower leg: Edema present.     Comments: Chronic lymphedema with venous stasis dermatitis  Lymphadenopathy:     Cervical: No cervical adenopathy.  Skin:    General: Skin is warm and dry.  Neurological:     General: No focal deficit present.     Mental Status: She is alert and oriented to person, place, and time.      EXAM: CHEST - 2 VIEW   COMPARISON:  November 05, 2023.   FINDINGS: Stable cardiomediastinal silhouette. Right lung is clear. Mild left basilar subsegmental atelectasis is noted. Bony thorax is unremarkable.   IMPRESSION: Mild left basilar subsegmental atelectasis.   Last CBC Lab Results  Component Value Date   WBC 5.0 11/17/2023   HGB 9.0 (L) 11/17/2023   HCT 29.7 (L) 11/17/2023   MCV 82.6 11/17/2023   MCH 24.3 (L) 11/02/2023   RDW 20.2 (H) 11/17/2023   PLT 152.0 11/17/2023   Last metabolic panel Lab Results  Component Value Date   GLUCOSE 339 (H) 11/17/2023   NA 139 11/17/2023  K 3.6 11/17/2023   CL 100 11/17/2023   CO2 31 11/17/2023   BUN 18 11/17/2023   CREATININE 0.93 11/17/2023   GFR 59.81 (L) 11/17/2023   CALCIUM 8.2 (L) 11/17/2023   PROT 6.1 (L) 11/03/2023   ALBUMIN 2.9 (L) 09/19/2023   LABGLOB 3.4 08/17/2015   AGRATIO 1.0 08/17/2015   BILITOT 1.0 09/19/2023   ALKPHOS 63 09/19/2023   AST 38 09/19/2023   ALT 20 09/19/2023   ANIONGAP 7 11/05/2023   Last hemoglobin A1c Lab Results  Component Value Date   HGBA1C 7.7 (A) 11/18/2023   HGBA1C 7.7 11/18/2023   HGBA1C 7.7  (A) 11/18/2023   HGBA1C 7.7 (A) 11/18/2023      The 10-year ASCVD risk score (Arnett DK, et al., 2019) is: 42.8%  Assessment & Plan:  Venous insufficiency of both lower extremities -     Spironolactone; Take 1 tablet (25 mg total) by mouth daily.  Dispense: 30 tablet; Refill: 0 -     Ambulatory referral to Physical Therapy  Type 2 diabetes mellitus with diabetic neuropathy, with long-term current use of insulin (HCC) -     Dapagliflozin Propanediol; Take 1 tablet (5 mg total) by mouth daily.  Dispense: 30 tablet; Refill: 0 -     Basic Metabolic Panel Without GFR -     Magnesium  Spinal stenosis of lumbar region with neurogenic claudication -     Ambulatory referral to Orthopedics  Lymphedema associated with obesity -     Spironolactone; Take 1 tablet (25 mg total) by mouth daily.  Dispense: 30 tablet; Refill: 0 -     Ambulatory referral to Physical Therapy  Venous stasis dermatitis of both lower extremities -     Spironolactone; Take 1 tablet (25 mg total) by mouth daily.  Dispense: 30 tablet; Refill: 0 -     Ambulatory referral to Physical Therapy  Assessment and Plan    Leg weakness and instability Chronic leg weakness and instability, likely neurogenic claudication due to lumbar stenosis. History of lumbar compression fracture and chronic low back pain may contribute. - Refer to spine specialist for evaluation of neurogenic claudication and lumbar stenosis. - Consider second opinion for leg weakness and instability.  Congestive heart failure Hx of congestive heart failure with persistent leg swelling despite furosemide. Possible lymphedema or venous insufficiency contributing. Chest x-ray improved since January. No signs of acute CHF upon exam - Continue furosemide 60 mg twice daily. - Add complementary medication to furosemide for swelling.  Lymphedema and venous insufficiency Chronic leg swelling with discoloration and hard, scaly skin. Symptoms may be exacerbated by  heart failure and insulin use. Invokana may contribute to lower extremity issues (higher amputation risk). - Switch from Invokana to a different SGLT2 to reduce side effects. - Monitor leg swelling and skin changes.  Diabetes mellitus with hypoglycemia Diabetes managed with regular insulin, experiencing nocturnal hypoglycemia. Current regimen lacks basal insulin, contributing to hypoglycemia. Insulin degludec Horace Lye) considered for longer action. - Discuss potential switch to insulin degludec Horace Lye) upon follow up. - For now, Monitor blood glucose levels closely. - Use Dexcom for continuous glucose monitoring.  Follow-up Follow-up with primary care provider scheduled for April 23 for annual wellness visit. Further management of diabetes and leg symptoms to be discussed. - Follow up April 23 for annual wellness visit. - Recheck potassium levels due to furosemide use. - Schedule follow-up appointment in 10 days to reassess symptoms and medication changes.         Return in  about 10 days (around 01/19/2024).   Mandy Second, PA

## 2024-01-09 NOTE — Telephone Encounter (Signed)
 Pt is requesting to transfer FROM: [place]  Pt is requesting to transfer TO: [facility]  Reason for requested transfer:Liked experience and the extra information given  Please advise

## 2024-01-10 LAB — MAGNESIUM: Magnesium: 1.8 mg/dL (ref 1.5–2.5)

## 2024-01-11 NOTE — Telephone Encounter (Signed)
 Yes okay

## 2024-01-12 ENCOUNTER — Telehealth: Payer: Self-pay

## 2024-01-12 NOTE — Telephone Encounter (Signed)
 Noted.

## 2024-01-12 NOTE — Telephone Encounter (Signed)
 Copied from CRM [id number]. Topic: Clinical - Medication Question >> Jan 12, 2024 10:09 AM Artemio Larry wrote: Reason for CRM: Patient requested a call back from [name] at [phone] regarding her medication  Spoke with patient, her insulin medication may be changed to [place] and wanted to know if there was a patient assistance program that may assist with coverage.

## 2024-01-12 NOTE — Telephone Encounter (Signed)
 PCP changed pt will need to reschedule next visit to reflect change

## 2024-01-13 NOTE — Telephone Encounter (Signed)
 Please confirm if patient will be starting this medication

## 2024-01-13 NOTE — Telephone Encounter (Signed)
Pt has been scheduled for follow up. 

## 2024-01-13 NOTE — Telephone Encounter (Signed)
 I told patient at her appointment to make a 7-10 day follow up in which we would address her insulin at that time. I will not change it until follow up. I didn't want to change 5 meds all at the same time

## 2024-01-20 ENCOUNTER — Ambulatory Visit (HOSPITAL_COMMUNITY): Attending: Urgent Care | Admitting: Physical Therapy

## 2024-01-20 ENCOUNTER — Encounter (HOSPITAL_COMMUNITY): Payer: Self-pay | Admitting: Physical Therapy

## 2024-01-20 ENCOUNTER — Other Ambulatory Visit: Payer: Self-pay

## 2024-01-20 DIAGNOSIS — I872 Venous insufficiency (chronic) (peripheral): Secondary | ICD-10-CM | POA: Diagnosis not present

## 2024-01-20 DIAGNOSIS — I890 Lymphedema, not elsewhere classified: Secondary | ICD-10-CM | POA: Diagnosis not present

## 2024-01-20 DIAGNOSIS — E669 Obesity, unspecified: Secondary | ICD-10-CM | POA: Insufficient documentation

## 2024-01-20 NOTE — Therapy (Signed)
 OUTPATIENT PHYSICAL THERAPY LYMPHEDEMA EVALUATION  Patient Name: [name] MRN: [id number] DOB:November 22, 1945, 78 y.o., female Today's Date: 01/20/2024  END OF SESSION:  PT End of Session - 01/20/24 1554     Visit Number 1    Number of Visits 18    Date for PT Re-Evaluation 03/02/24    Authorization Type [employer]    Authorization Time Period Auth put in    PT Start Time 1445    PT Stop Time 1547    PT Time Calculation (min) 62 min    Activity Tolerance Patient tolerated treatment well    Behavior During Therapy [facility] for tasks assessed/performed             Past Medical History:  Diagnosis Date   Carpal tunnel syndrome of right wrist 03/2013   recurrent   Chronic diastolic heart failure (HCC)    Cirrhosis, 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 ? to get liver bx if she gets bariatric surgery? Mild portal hypertensive gastropathy on EGD 08/2019.   Fibromyalgia    GAD (generalized anxiety disorder)    GERD    History of hiatal hernia    History of iron  deficiency anemia 12/2018   Inadequate absorption secondary to chronic/long term PPI therapy + portal hypertensive gastroduodenopathy. No GIB found on EGD, colonosc, and givens. Iron  infusions X multiple.   History of thrombocytopenia 12/2011   Hyperlipidemia    Intolerant of statins   HYPERTENSION    IBS (irritable bowel syndrome)    -D.  Good response to bentyl  and imodium  as of 06/2018 GI f/u.   IDDM (insulin  dependent diabetes mellitus)    with DPN (managed by Dr. Aldona Amel but then in 2018 pt preferred to have me manage for her convenience)   Limited mobility    Requires a walker for arthritic pain, widespread musculoskeletal pain, and neuropathic pain.   Morbid obesity (HCC)    As of 11/2018, pt considering sleeve gastectomy vs bipass as of eval by Dr. Molli Angelucci considering as of 12/2019.   Nonalcoholic steatohepatitis    Viral Hep screens NEG.  CT 2015.  Transaminasemia.   U/S 07/2018 showed early changes of cirrhosis.   OSA (obstructive sleep apnea) 09/14/2015   sleep study 09/07/15: severe obstructive sleep apnea with an AHI of 72 and SaO2 low of 75%.>referred to sleep MD   Osteoarthritis    hips, shoulders, knees   PAF (paroxysmal atrial fibrillation) (HCC)    One documented episode (after getting EGD 2016).  Was on amiodarone  x 3 mo.  Rate control with metoprolol  + anticoag with xarelto . Watchman 12/2022, off anticoag   Portal hypertensive gastropathy (HCC)    hemorrhagic gastropathy + non-bleeding gastric ulcers on EGD 08/2023   Recurrent epistaxis    Granuloma in L nare cauterized by ENT 04/2020. Another cautery 06/2020   Small fiber neuropathy    Due to DM.  Symmetric hands and feet tingling/numbness.   Ulcerative colitis (HCC)    Remicade  infusion Q 8 weeks: in clinical and endoscopic remission as of 12/2018 GI f/u.  06/11/19 rpt colonoscopy->cecal and ascending colon colitis.   Past Surgical History:  Procedure Laterality Date   ABDOMINAL HYSTERECTOMY  1980   Paps no longer indicated.   BACK SURGERY     BACTERIAL OVERGROWTH TEST N/A 07/13/2015   Procedure: BACTERIAL OVERGROWTH TEST;  Surgeon: Ruby Corporal, MD;  [facility];  Service: Endoscopy;  Laterality: N/A;  730  BILATERAL SALPINGOOPHORECTOMY  02/10/2001   BIOPSY  06/11/2019   Procedure: BIOPSY;  Surgeon: Ruby Corporal, MD;  [facility];  Service: Endoscopy;;  colon   BIOPSY  09/27/2019   Procedure: BIOPSY;  Surgeon: Ruby Corporal, MD;  [facility];  Service: Endoscopy;;  gastric duodenal   BIOPSY  09/15/2020   Procedure: BIOPSY;  Surgeon: Urban Garden, MD;  [facility];  Service: Gastroenterology;;   BIOPSY  09/25/2023   Procedure: BIOPSY;  Surgeon: Urban Garden, MD;  [facility];  Service: Gastroenterology;;   BREAST REDUCTION SURGERY  1994   bilat   BREAST SURGERY     CARDIOVASCULAR  STRESS TEST  07/2010   Lexiscan myoview: normal   CARPAL TUNNEL RELEASE Right 1996   CARPAL TUNNEL RELEASE Left 03/21/2003   CARPAL TUNNEL RELEASE Right 05/04/2013   Procedure: CARPAL TUNNEL RELEASE;  Surgeon: Amelie Baize., MD;  Location: [facility];  Service: Orthopedics;  Laterality: Right;   CARPAL TUNNEL RELEASE Left 09/21/2013   Procedure: LEFT CARPAL TUNNEL RELEASE;  Surgeon: Amelie Baize., MD;  Location: [facility];  Service: Orthopedics;  Laterality: Left;   CHOLECYSTECTOMY     COLONOSCOPY WITH PROPOFOL  N/A 08/04/2015   Colitis in remission.  No polyps.  Procedure: COLONOSCOPY WITH PROPOFOL ;  Surgeon: Ruby Corporal, MD;  [facility]: [facility];  Service: Endoscopy;  Laterality: N/A;  cecum time in  0820   time out  0827    total time 7 minutes   COLONOSCOPY WITH PROPOFOL  N/A 06/11/2019   cecal and ascending colon colitis.  Procedure: COLONOSCOPY WITH PROPOFOL ;  Surgeon: Ruby Corporal, MD;  [facility];  Service: Endoscopy;  Laterality: N/A;  730a   COLONOSCOPY WITH PROPOFOL  N/A 09/15/2020   Procedure: COLONOSCOPY WITH PROPOFOL ;  Surgeon: Urban Garden, MD;  [facility];  Service: Gastroenterology;  Laterality: N/A;  1030   ESOPHAGEAL DILATION N/A 08/04/2015   Procedure: ESOPHAGEAL DILATION;  Surgeon: Ruby Corporal, MD;  [facility]: [facility];  Service: Endoscopy;  Laterality: N/AFelicita Horns, no mucousal disruption   ESOPHAGOGASTRODUODENOSCOPY  09/27/2019   Performed for IDA.  Esoph dilation was done but no stricture present.  Mild portal hypertensive gastropathy, o/w normal.  Duodenal bx NEG.  h pylori neg.   ESOPHAGOGASTRODUODENOSCOPY     09/25/2023 inactive chronic gastritis, h pylor neg. benign   ESOPHAGOGASTRODUODENOSCOPY     09/25/23 hemorrhagic portal gastropathy + nonbleeding gastric ulcers (H pylori neg)   ESOPHAGOGASTRODUODENOSCOPY (EGD) WITH ESOPHAGEAL DILATION  12/02/2005    ESOPHAGOGASTRODUODENOSCOPY (EGD) WITH PROPOFOL  N/A 08/04/2015   Procedure: ESOPHAGOGASTRODUODENOSCOPY (EGD) WITH PROPOFOL ;  Surgeon: Ruby Corporal, MD;  [facility];  Service: Endoscopy;  Laterality: N/A;  procedure 1   ESOPHAGOGASTRODUODENOSCOPY (EGD) WITH PROPOFOL  N/A 09/27/2019   Procedure: ESOPHAGOGASTRODUODENOSCOPY (EGD) WITH PROPOFOL ;  Surgeon: Ruby Corporal, MD;  [facility];  Service: Endoscopy;  Laterality: N/A;  12:10   ESOPHAGOGASTRODUODENOSCOPY (EGD) WITH PROPOFOL  N/A 10/31/2021   Procedure: ESOPHAGOGASTRODUODENOSCOPY (EGD) WITH PROPOFOL ;  Surgeon: Ruby Corporal, MD;  [facility];  Service: Endoscopy;  Laterality: N/A;  930   ESOPHAGOGASTRODUODENOSCOPY (EGD) WITH PROPOFOL  N/A 09/25/2023   Procedure: ESOPHAGOGASTRODUODENOSCOPY (EGD) WITH PROPOFOL ;  Surgeon: Urban Garden, MD;  [facility];  Service: Gastroenterology;  Laterality: N/A;  10:30AM;ASA 3   FLEXIBLE SIGMOIDOSCOPY  01/17/2012   Procedure: FLEXIBLE SIGMOIDOSCOPY;  Surgeon: Jefferey Minerva  Darlen Eglin, MD;  [facility];  Service: Endoscopy;  Laterality: N/A;   GIVENS CAPSULE STUDY N/A 08/03/2019   Procedure: GIVENS CAPSULE STUDY (performed for IDA)->some food debris in stomach and small amount of blood.  Surgeon: Ruby Corporal, MD;  [facility];  Service: Endoscopy;  Laterality: N/A;  730AM   HEMILAMINOTOMY LUMBAR SPINE Bilateral 09/07/1999   L4-5   HOT HEMOSTASIS  09/25/2023   Procedure: HOT HEMOSTASIS (ARGON PLASMA COAGULATION/BICAP);  Surgeon: Umberto Ganong, Bearl Limes, MD;  [facility];  Service: Gastroenterology;;   KNEE ARTHROSCOPY Right 01/1999; 10/2000   LEFT ATRIAL APPENDAGE OCCLUSION N/A 01/23/2023   Procedure: LEFT ATRIAL APPENDAGE OCCLUSION;  Surgeon: Arnoldo Lapping, MD;  Location: [facility];  Service: Cardiovascular;  Laterality: N/A;   LEFT ATRIAL APPENDAGE OCCLUSION     LYSIS OF ADHESION  02/10/2001   MALONEY DILATION   09/27/2019   Procedure: MALONEY DILATION;  Surgeon: Ruby Corporal, MD;  [facility];  Service: Endoscopy;;   NASAL ENDOSCOPY WITH EPISTAXIS CONTROL Bilateral 01/25/2022   Procedure: NASAL ENDOSCOPY WITH EPISTAXIS CONTROL;  Surgeon: Ammon Bales, MD;  Location: [facility];  Service: ENT;  Laterality: Bilateral;   NASAL SEPTOPLASTY W/ TURBINOPLASTY Bilateral 01/25/2022   Procedure: NASAL SEPTOPLASTY WITH TURBINATE REDUCTION;  Surgeon: Ammon Bales, MD;  Location: [facility];  Service: ENT;  Laterality: Bilateral;   RECTOCELE REPAIR  1990; 09/12/2006   TARSAL TUNNEL RELEASE  2002   TEE WITHOUT CARDIOVERSION N/A 01/23/2023   Procedure: TRANSESOPHAGEAL ECHOCARDIOGRAM;  Surgeon: Arnoldo Lapping, MD;  Location: [facility] INVASIVE CV LAB;  Service: Cardiovascular;  Laterality: N/A;   TRANSESOPHAGEAL ECHOCARDIOGRAM (CATH LAB) N/A 08/26/2023   4mm leak around watchman device. Procedure: TRANSESOPHAGEAL ECHOCARDIOGRAM;  Surgeon: Euell Herrlich, MD;  Location: [facility] INVASIVE CV LAB;  Service: Cardiovascular;  Laterality: N/A;   TRANSTHORACIC ECHOCARDIOGRAM  08/04/2015   EF 60-65%, normal wall motion, mild LVH, mild LA dilation, grd I DD.  11/2023 overall poor quality, EF 55-60%   TUMOR EXCISION Left 03/21/2003   dorsal 1st web space (hand)   URETEROLYSIS Right 02/10/2001   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   Fecal urgency 08/02/2020    Deviated septum 07/28/2020   Epistaxis, recurrent 05/15/2020   Ulcerative colitis without complications (HCC) 08/24/2019   Gastrointestinal hemorrhage 08/24/2019   Iron  deficiency anemia due to chronic blood loss 07/28/2019   Iron  deficiency anemia 05/20/2019   Heme positive stool 05/20/2019   History of iron  deficiency anemia 12/2018   Vertigo 12/04/2015   Uncontrolled type 2 diabetes mellitus with peripheral neuropathy 11/14/2015   OSA (obstructive sleep apnea), severe 09/14/2015   B12 deficiency 08/17/2015   Vitamin B6 deficiency 08/17/2015   PAF (paroxysmal atrial fibrillation) (HCC) 08/17/2015   Chronic anticoagulation 08/17/2015   Atrial fibrillation with RVR (HCC) 08/04/2015   Dysphagia    Gastroesophageal reflux disease without esophagitis    Lymphocytosis 07/04/2015   Obesity 05/18/2014   Allergic sinusitis 12/09/2013  Myalgia 04/28/2012   Polyarthralgia 04/28/2012   Elevated LFTs 01/16/2012   Insomnia 01/12/2012   Small fiber neuropathy 05/30/2011   Anxiety 05/30/2011   Dyslipidemia 01/24/2011   PALPITATIONS, RECURRENT 08/08/2010   Essential hypertension 04/12/2010   GERD 04/12/2010   Ulcerative colitis (HCC) 04/12/2010    PCP: Mandy Second, PA  REFERRING PROVIDER: Mandy Second, PA  REFERRING DIAG: I87.2 (ICD-10-CM) - Venous insufficiency of both lower extremities I89.0,E66.9 (ICD-10-CM) - Lymphedema associated with obesity I87.2 (ICD-10-CM) - Venous stasis dermatitis of both lower extremities  THERAPY DIAG:  Lymphedema, not elsewhere classified  Rationale for Evaluation and Treatment: Rehabilitation  ONSET DATE: chronic but has increased significantly in the past year.   SUBJECTIVE:                                                                                                                                                                                           SUBJECTIVE STATEMENT:She notes swelling in her legs, particularly in the feet, which  has worsened since her discharge from the hospital in January. She is currently taking furosemide  60 mg daily, taking three 20mg  tabs in the morning and three in the evening, but has not noticed significant improvement in the swelling.   Pt states that her mother had large legs.  She has been elevating her legs and completing ankle pumps since the beginning of February without improvement.  She has tried compression but her legs are to big for the garments at this time. Pt states that her edema is from the knees down.  Pt states that she is now in a size up from her normal shoe size and has to take the shoe laces out to fit in them.      PERTINENT HISTORY:  [name] is a 78 y.o. female with past medical history significant for chronic diastolic congestive heart failure, proximal atrial fibrillation not on anticoagulation due to recurrent epistaxis s/p Watchman procedure (12/2022), CAD on aspirin , type 2 diabetes mellitus, anxiety/depression, nonalcoholic cirrhosis, OSA on CPAP, morbid obesity hospitalized for acute CHF from 10/31/23-11/04/2574 .     PAIN:  Are you having pain?   NPRS scale: 0/10  PRECAUTIONS: Other: cellulitis    RED FLAGS: None   WEIGHT BEARING RESTRICTIONS: No  FALLS:  Has patient fallen in last 6 months? No  LIVING ENVIRONMENT: Lives with: lives with their family Lives in: House/apartment Stairs: Yes: Internal: 2 steps; none and External: 2 steps; none Has following equipment at home: Quad cane small base  PATIENT GOALS: less swelling    OBJECTIVE: Note: Objective measures were completed at Evaluation unless otherwise noted.  COGNITION: Overall cognitive status: Within functional limits for tasks assessed   PALPATION: Increased induration greater in LE but noted in thigh area as well   OBSERVATIONS / OTHER ASSESSMENTS: slight redness and thickening of skin   POSTURE: forward head, rounded shoulders and increased kyphosis  All within normal limits:    LYMPHEDEMA ASSESSMENTS:    LE LANDMARK RIGHT eval  At groin   30 cm proximal to suprapatella   20 cm proximal to suprapatella   10 cm proximal to suprapatella   At midpatella / popliteal crease 52.5  30 cm proximal to floor at lateral plantar foot 53.5  20 cm proximal to floor at lateral plantar foot 44   10 cm proximal to floor at lateral plantar foot 29  Circumference of ankle/heel 33.5  5 cm proximal to 1st MTP joint 24  Across MTP joint 23  Around proximal great toe   (Blank rows = not tested)  LE LANDMARK LEFT eval  At groin   30 cm proximal to suprapatella   20 cm proximal to suprapatella   10 cm proximal to suprapatella   At midpatella / popliteal crease 53  30 cm proximal to floor at lateral plantar foot 54.2  20 cm proximal to floor at lateral plantar foot 40.5  10 cm proximal to floor at lateral plantar foot 32.3  Circumference of ankle/heel 35.4  5 cm proximal to 1st MTP joint 24.3  Across MTP joint 23.3  Around proximal great toe   (Blank rows = not tested)  FUNCTIONAL TESTS:  Gait : 1:30 minutes to ambulate with quad cane x 80 ft    TODAY'S TREATMENT:                                                                                                                              DATE: 01/20/2024 : what lymphedema is and how it is controled, LE exercises, what bandages to order.    PATIENT EDUCATION:  Education details: skin care recommending using Eucerin or Aquaphor, exercise to include ankle pumps, LAQ, hip ab/adduction, marching and diaphragm breath, what manual will be like as well as multilayer compression dressing.  Person educated: Patient and Spouse Education method: Explanation, Verbal cues, and Handouts Education comprehension: verbalized understanding and returned demonstration  HOME EXERCISE PROGRAM: ankle pumps, LAQ, hip ab/adduction, marching and diaphragm breath  ASSESSMENT:  CLINICAL IMPRESSION: Patient is a 78 y.o. female who was seen  today for physical therapy evaluation and treatment for B LE lymphedema.  Evaluation demonstrates increased swelling of B LE with skin thickening and induration.  She has some redness along the anterior aspect of both LE.  Therapist explained that there is induration in pt thigh area as well and this area would benefit from decongestive techniques as well.  [name] will decide if she is interested in compression bandaging to the thigh level or if she will wear capri compression.  [name] has recently been hospitalized  for acute CHF therefore the therapist messaged  her cardiologist  to see if she is able to go thru total decongestive techniques. (Amy Micael Adas was CC on this due to therapist being out of town next week.   OBJECTIVE IMPAIRMENTS: cardiopulmonary status limiting activity, decreased activity tolerance, decreased mobility, difficulty walking, decreased ROM, decreased strength, increased edema, and obesity.   ACTIVITY LIMITATIONS: carrying, lifting, stairs, bathing, dressing, and locomotion level  PARTICIPATION LIMITATIONS: shopping and community activity  PERSONAL FACTORS: Age, Fitness, Time since onset of injury/illness/exacerbation, and 1-2 comorbidities: CHF,   are also affecting patient's functional outcome.   REHAB POTENTIAL: Good  CLINICAL DECISION MAKING: Evolving/moderate complexity  EVALUATION COMPLEXITY: Moderate  GOALS: Goals reviewed with patient? Yes  SHORT TERM GOALS: Target date: 02/10/24  PT to be I in HEP to improve lymphatic circulation Baseline: Goal status: INITIAL  2.  PT to have lost 1-2 cm in her feet to be able to fit in her shoes, 2-3 cm in legs for improved skin integrity. Baseline:  Goal status: INITIAL   LONG TERM GOALS: Target date: 03/02/24  PT to have lost 2 -3 cm in her feet, 3-5 cm in her LE for decreased risk of cellulitis.  Baseline:  Goal status: INITIAL  2.  Pt to have aqcuired juxtafit compression garment and be able to don to be  able to continue maintenance phase of treatment.  Baseline:  Goal status: INITIAL  3.  PT to be I in self manual to assist in increasing lymphatic circulation Baseline:  Goal status: INITIAL  PLAN:  PT FREQUENCY: 3x/week  PT DURATION: 6 weeks  PLANNED INTERVENTIONS: 97110-Therapeutic exercises, 97535- Self Care, and 16109- Manual therapy  PLAN FOR NEXT SESSION: cut foam, begin manual and teaching self manual techniques.   Leodis Rainwater, PT CLT [phone]  01/20/2024, 3:55 PM  [employer] Auth Request  Referring diagnosis code (ICD 10)? 189 Treatment diagnosis codes (ICD 10)? (if different than referring diagnosis) 189 What was this (referring dx) caused by? []  Surgery []  Fall []  Ongoing issue []  Arthritis [x]  Other: __most likely heredity __________  Laterality: []  Rt []  Lt [x]  Both  Deficits: []  Pain []  Stiffness []  Weakness [x]  Edema []  Balance Deficits []  Coordination []  Gait Disturbance [x]  ROM []  Other   Functional Tool Score: see measurements.   CPT codes: See Planned Interventions listed in the Plan section of the Evaluation.

## 2024-01-20 NOTE — Therapy (Unsigned)
 Per Cardiologist:     Received: Today Lenise Quince, MD  Adrienne Alberts, PT Ok to proceed Alexandria Angel       Previous Messages    ----- Message ----- From: Adrienne Alberts, PT Sent: 01/20/2024   3:53 PM EDT To: Lenise Quince, MD; Lorenso Romance, PTA  Good afternoon Dr. Audery Blazing, I am a lymphedema certified therapist.  We have a mutual patient, [name],  who has been referred to our clinic for lymphedema treatment.  I saw that she was in the hospital in January for acute CHF, therefore I wanted to medically clear her from your aspect prior to starting total decongestive techniques which will increase the volume of fluid brought to the heart.    Thanks you for your consideration  Leodis Rainwater, PT CLT [phone]

## 2024-01-21 ENCOUNTER — Ambulatory Visit (INDEPENDENT_AMBULATORY_CARE_PROVIDER_SITE_OTHER): Payer: Medicare PPO | Admitting: *Deleted

## 2024-01-21 DIAGNOSIS — Z0000 Encounter for general adult medical examination without abnormal findings: Secondary | ICD-10-CM

## 2024-01-21 NOTE — Progress Notes (Signed)
 Subjective:   [name] is a 78 y.o. female who presents for Medicare Annual (Subsequent) preventive examination.  Visit Complete: Virtual I connected with  [name] on 01/21/24 by a audio enabled telemedicine application and verified that I am speaking with the correct person using two identifiers.  Patient Location: Home  Provider Location: Home Office  I discussed the limitations of evaluation and management by telemedicine. The patient expressed understanding and agreed to proceed.  Vital Signs: Because this visit was a virtual/telehealth visit, some criteria may be missing or patient reported. Any vitals not documented were not able to be obtained and vitals that have been documented are patient reported.   Cardiac Risk Factors include: advanced age (>72men, >61 women);diabetes mellitus;family history of premature cardiovascular disease;hypertension     Objective:    There were no vitals filed for this visit. There is no height or weight on file to calculate BMI.     01/21/2024    2:35 PM 10/31/2023    4:52 PM 09/25/2023    8:22 AM 09/19/2023    1:40 PM 08/26/2023    9:19 AM 06/17/2023    3:21 PM 01/15/2023    1:32 PM  Advanced Directives  Does Patient Have a Medical Advance Directive? Yes No Yes No Yes Yes Yes  Type of Psychiatrist of [place];Living will Living will Healthcare Power of [place];Living will  Does patient want to make changes to medical advance directive?      Yes (ED - Information included in AVS)   Copy of Healthcare Power of Attorney in Chart? Yes - validated most recent copy scanned in chart (See row information)      No - copy requested  Would patient like information on creating a medical advance directive?  No - Patient declined No - Patient declined No - Patient declined       Current Medications (verified) Outpatient Encounter Medications as of 01/21/2024  Medication Sig   Accu-Chek  Softclix Lancets lancets USE TO CHECK BLOOD GLUCOSE UP TO 6 TIMES DAILY AS DIRECTED   acetaminophen  (TYLENOL ) 500 MG tablet Take 1,000 mg by mouth every 6 (six) hours as needed for mild pain (pain score 1-3).   albuterol  (VENTOLIN  HFA) 108 (90 Base) MCG/ACT inhaler Inhale 2 puffs into the lungs every 6 (six) hours as needed for wheezing or shortness of breath.   aspirin  EC 81 MG tablet Take 1 tablet (81 mg total) by mouth daily. Swallow whole.   blood glucose meter kit and supplies KIT Use up to six times daily as directed. DX. E11.9   citalopram  (CELEXA ) 20 MG tablet TAKE 1 TABLET BY MOUTH EVERY DAY   dapagliflozin  propanediol (FARXIGA ) 5 MG TABS tablet Take 1 tablet (5 mg total) by mouth daily.   diphenoxylate -atropine  (LOMOTIL ) 2.5-0.025 MG tablet Take 1 tablet by mouth 4 (four) times daily as needed for diarrhea or loose stools.   Dulaglutide  (TRULICITY ) 1.5 MG/0.5ML SOAJ INJECT 1.5 MG (0.5ML) UNDER THE SKIN ONCE A WEEK   esomeprazole  (NEXIUM ) 40 MG capsule Take 1 capsule (40 mg total) by mouth daily before breakfast.   furosemide  (LASIX ) 20 MG tablet Take 3 tablets (60 mg total) by mouth 2 (two) times daily.   gabapentin  (NEURONTIN ) 600 MG tablet TAKE 1 AND 1/2 TABLETS BY MOUTH TWICE A DAY   glucose blood (ACCU-CHEK GUIDE TEST) test strip USE TO CHECK BLOOD GLUCOSE UP TO 6 TIMES DAILY.  HYDROcodone -acetaminophen  (NORCO/VICODIN) 5-325 MG tablet 1 tab po bid prn pain   INFLIXIMAB  IV Inject into the vein every 8 (eight) weeks. Remicaid 5mg /kg Infusion every 8 weeks [facility])   Insulin  Pen Needle (B-D UF III MINI PEN NEEDLES) 31G X 5 MM MISC USE TO INJECT INSULINS EQUAL TO 6 TIMES DAILY.   insulin  regular human CONCENTRATED (HUMULIN  R U-500 KWIKPEN) 500 UNIT/ML KwikPen Inject 100 units into the skin at breakfast and lunch.   lactase (LACTAID) 3000 UNITS tablet Take 3,000 Units by mouth as needed (when eating foods containing dairy).    meclizine  (ANTIVERT ) 25 MG tablet Take 1  tablet (25 mg total) by mouth 3 (three) times daily as needed for dizziness or nausea.   metoprolol  succinate (TOPROL -XL) 100 MG 24 hr tablet TAKE 1 TABLET BY MOUTH 2 TIMES DAILY. TAKE WITH OR IMMEDIATELY FOLLOWING A MEAL   oxybutynin  (DITROPAN -XL) 5 MG 24 hr tablet TAKE 1 TABLET BY MOUTH EVERYDAY AT BEDTIME   potassium chloride  (KLOR-CON  M10) 10 MEQ tablet Take 1 tablet (10 mEq total) by mouth daily. (Patient taking differently: Take 20 mEq by mouth daily.)   spironolactone  (ALDACTONE ) 25 MG tablet Take 1 tablet (25 mg total) by mouth daily.   [DISCONTINUED] bromocriptine (PARLODEL) 2.5 MG tablet Take 2.5 mg by mouth 2 (two) times daily.   No facility-administered encounter medications on file as of 01/21/2024.    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 ? to get liver bx if she gets bariatric surgery? Mild portal hypertensive gastropathy on EGD 08/2019.   Fibromyalgia    GAD (generalized anxiety disorder)    GERD    History of hiatal hernia    History of iron  deficiency anemia 12/2018   Inadequate absorption secondary to chronic/long term PPI therapy + portal hypertensive gastroduodenopathy. No GIB found on EGD, colonosc, and givens. Iron  infusions X multiple.   History of thrombocytopenia 12/2011   Hyperlipidemia    Intolerant of statins   HYPERTENSION    IBS (irritable bowel syndrome)    -D.  Good response to  bentyl  and imodium  as of 06/2018 GI f/u.   IDDM (insulin  dependent diabetes mellitus)    with DPN (managed by Dr. Aldona Amel but then in 2018 pt preferred to have me manage for her convenience)   Limited mobility    Requires a walker for arthritic pain, widespread musculoskeletal pain, and neuropathic pain.   Morbid obesity (HCC)    As of 11/2018, pt considering sleeve gastectomy vs bipass as of eval by Dr. Molli Angelucci considering as of 12/2019.   Nonalcoholic steatohepatitis    Viral Hep screens NEG.  CT 2015.  Transaminasemia.  U/S 07/2018 showed early changes of cirrhosis.   OSA (obstructive sleep apnea) 09/14/2015   sleep study 09/07/15: severe obstructive sleep apnea with an AHI of 72 and SaO2 low of 75%.>referred to sleep MD   Osteoarthritis    hips, shoulders, knees   PAF (paroxysmal atrial fibrillation) (HCC)    One documented episode (after getting EGD 2016).  Was on amiodarone  x 3 mo.  Rate  control with metoprolol  + anticoag with xarelto . Watchman 12/2022, off anticoag   Portal hypertensive gastropathy (HCC)    hemorrhagic gastropathy + non-bleeding gastric ulcers on EGD 08/2023   Recurrent epistaxis    Granuloma in L nare cauterized by ENT 04/2020. Another cautery 06/2020   Small fiber neuropathy    Due to DM.  Symmetric hands and feet tingling/numbness.   Ulcerative colitis (HCC)    Remicade  infusion Q 8 weeks: in clinical and endoscopic remission as of 12/2018 GI f/u.  06/11/19 rpt colonoscopy->cecal and ascending colon colitis.   Past Surgical History:  Procedure Laterality Date   ABDOMINAL HYSTERECTOMY  1980   Paps no longer indicated.   BACK SURGERY     BACTERIAL OVERGROWTH TEST N/A 07/13/2015   Procedure: BACTERIAL OVERGROWTH TEST;  Surgeon: Ruby Corporal, MD;  [facility];  Service: Endoscopy;  Laterality: N/A;  730     BILATERAL SALPINGOOPHORECTOMY  02/10/2001   BIOPSY  06/11/2019   Procedure: BIOPSY;  Surgeon: Ruby Corporal, MD;  [facility];  Service: Endoscopy;;  colon   BIOPSY  09/27/2019   Procedure: BIOPSY;  Surgeon: Ruby Corporal, MD;  [facility];  Service: Endoscopy;;  gastric duodenal   BIOPSY  09/15/2020   Procedure: BIOPSY;  Surgeon: Urban Garden, MD;  [facility];  Service: Gastroenterology;;   BIOPSY  09/25/2023   Procedure: BIOPSY;  Surgeon: Urban Garden, MD;  [facility];  Service: Gastroenterology;;   BREAST REDUCTION SURGERY  1994   bilat   BREAST SURGERY     CARDIOVASCULAR STRESS TEST  07/2010   Lexiscan myoview: normal   CARPAL TUNNEL RELEASE Right 1996   CARPAL TUNNEL RELEASE Left 03/21/2003   CARPAL TUNNEL RELEASE Right 05/04/2013   Procedure: CARPAL TUNNEL RELEASE;  Surgeon: Amelie Baize., MD;  Location: [facility];  Service: Orthopedics;  Laterality: Right;   CARPAL TUNNEL RELEASE Left 09/21/2013   Procedure: LEFT CARPAL TUNNEL RELEASE;  Surgeon: Amelie Baize., MD;  Location: [facility];  Service: Orthopedics;  Laterality: Left;   CHOLECYSTECTOMY     COLONOSCOPY WITH PROPOFOL  N/A 08/04/2015   Colitis in remission.  No polyps.  Procedure: COLONOSCOPY WITH PROPOFOL ;  Surgeon: Ruby Corporal, MD;  [facility];  Service: Endoscopy;  Laterality: N/A;  cecum time in  0820   time out  0827    total time 7 minutes   COLONOSCOPY WITH PROPOFOL  N/A 06/11/2019   cecal and ascending colon colitis.  Procedure: COLONOSCOPY WITH PROPOFOL ;  Surgeon: Ruby Corporal, MD;  [facility];  Service: Endoscopy;  Laterality: N/A;  730a   COLONOSCOPY WITH PROPOFOL  N/A 09/15/2020   Procedure: COLONOSCOPY WITH PROPOFOL ;  Surgeon: Urban Garden, MD;  [facility];  Service: Gastroenterology;  Laterality: N/A;  1030   ESOPHAGEAL DILATION N/A 08/04/2015   Procedure: ESOPHAGEAL DILATION;  Surgeon: Ruby Corporal, MD;  [facility];  Service: Endoscopy;  Laterality: N/AFelicita Horns, no mucousal disruption   ESOPHAGOGASTRODUODENOSCOPY  09/27/2019   Performed for IDA.  Esoph dilation was done but no stricture present.  Mild portal hypertensive gastropathy, o/w normal.  Duodenal bx NEG.  h pylori neg.   ESOPHAGOGASTRODUODENOSCOPY     09/25/2023 inactive chronic gastritis, h pylor neg. benign   ESOPHAGOGASTRODUODENOSCOPY     09/25/23 hemorrhagic portal gastropathy + nonbleeding gastric ulcers (H pylori neg)  ESOPHAGOGASTRODUODENOSCOPY (EGD) WITH ESOPHAGEAL DILATION  12/02/2005   ESOPHAGOGASTRODUODENOSCOPY (EGD) WITH PROPOFOL  N/A 08/04/2015   Procedure: ESOPHAGOGASTRODUODENOSCOPY (EGD) WITH PROPOFOL ;  Surgeon: Ruby Corporal, MD;  [facility];  Service: Endoscopy;  Laterality: N/A;  procedure 1   ESOPHAGOGASTRODUODENOSCOPY (EGD) WITH PROPOFOL  N/A 09/27/2019   Procedure: ESOPHAGOGASTRODUODENOSCOPY (EGD) WITH PROPOFOL ;  Surgeon: Ruby Corporal, MD;  [facility];  Service: Endoscopy;  Laterality: N/A;  12:10   ESOPHAGOGASTRODUODENOSCOPY (EGD) WITH PROPOFOL  N/A 10/31/2021   Procedure: ESOPHAGOGASTRODUODENOSCOPY (EGD) WITH PROPOFOL ;  Surgeon: Ruby Corporal, MD;  [facility];  Service: Endoscopy;  Laterality: N/A;  930   ESOPHAGOGASTRODUODENOSCOPY (EGD) WITH PROPOFOL  N/A 09/25/2023   Procedure: ESOPHAGOGASTRODUODENOSCOPY (EGD) WITH PROPOFOL ;  Surgeon: Urban Garden, MD;  [facility];  Service: Gastroenterology;  Laterality: N/A;  10:30AM;ASA 3   FLEXIBLE SIGMOIDOSCOPY  01/17/2012   Procedure: FLEXIBLE SIGMOIDOSCOPY;  Surgeon: Ruby Corporal, MD;  [facility];  Service: Endoscopy;  Laterality: N/A;   GIVENS CAPSULE STUDY N/A 08/03/2019   Procedure: GIVENS CAPSULE STUDY (performed for IDA)->some food debris in stomach and small amount of blood.  Surgeon: Ruby Corporal, MD;  [facility];  Service: Endoscopy;  Laterality: N/A;  730AM   HEMILAMINOTOMY LUMBAR SPINE Bilateral 09/07/1999   L4-5   HOT  HEMOSTASIS  09/25/2023   Procedure: HOT HEMOSTASIS (ARGON PLASMA COAGULATION/BICAP);  Surgeon: Umberto Ganong, Bearl Limes, MD;  [facility];  Service: Gastroenterology;;   KNEE ARTHROSCOPY Right 01/1999; 10/2000   LEFT ATRIAL APPENDAGE OCCLUSION N/A 01/23/2023   Procedure: LEFT ATRIAL APPENDAGE OCCLUSION;  Surgeon: Arnoldo Lapping, MD;  Location: [facility] INVASIVE CV LAB;  Service: Cardiovascular;  Laterality: N/A;   LEFT ATRIAL APPENDAGE OCCLUSION     LYSIS OF ADHESION  02/10/2001   MALONEY DILATION  09/27/2019   Procedure: MALONEY DILATION;  Surgeon: Ruby Corporal, MD;  [facility];  Service: Endoscopy;;   NASAL ENDOSCOPY WITH EPISTAXIS CONTROL Bilateral 01/25/2022   Procedure: NASAL ENDOSCOPY WITH EPISTAXIS CONTROL;  Surgeon: Ammon Bales, MD;  Location: [facility] OR;  Service: ENT;  Laterality: Bilateral;   NASAL SEPTOPLASTY W/ TURBINOPLASTY Bilateral 01/25/2022   Procedure: NASAL SEPTOPLASTY WITH TURBINATE REDUCTION;  Surgeon: Ammon Bales, MD;  Location[facility];  Service: ENT;  Laterality: Bilateral;   RECTOCELE REPAIR  1990; 09/12/2006   TARSAL TUNNEL RELEASE  2002   TEE WITHOUT CARDIOVERSION N/A 01/23/2023   Procedure: TRANSESOPHAGEAL ECHOCARDIOGRAM;  Surgeon: Arnoldo Lapping, MD;  Location: [facility] INVASIVE CV LAB;  Service: Cardiovascular;  Laterality: N/A;   TRANSESOPHAGEAL ECHOCARDIOGRAM (CATH LAB) N/A 08/26/2023   4mm leak around watchman device. Procedure: TRANSESOPHAGEAL ECHOCARDIOGRAM;  Surgeon: Euell Herrlich, MD;  Location: [facility] INVASIVE CV LAB;  Service: Cardiovascular;  Laterality: N/A;   TRANSTHORACIC ECHOCARDIOGRAM  08/04/2015   EF 60-65%, normal wall motion, mild LVH, mild LA dilation, grd I DD.  11/2023 overall poor quality, EF 55-60%   TUMOR EXCISION Left 03/21/2003   dorsal 1st web space (hand)   URETEROLYSIS Right 02/10/2001   Family History  Problem Relation Age of Onset   Diabetes Mother    Hypertension Mother    Heart attack Father        Mid  55's   Heart disease Father    Lung disease Father        spot on lung; had lung surgery   Alcohol abuse Other    Hypertension Son    Diabetes Son    Emphysema  Maternal Grandfather    Asthma Maternal Grandfather    Colon cancer Paternal Grandfather    Stomach cancer Paternal Grandmother    Neuropathy Neg Hx    Social History   Socioeconomic History   Marital status: Married    Spouse name: Not on file   Number of children: Not on file   Years of education: Not on file   Highest education level: Not on file  Occupational History   Occupation: Retired  Tobacco Use   Smoking status: Never   Smokeless tobacco: Never  Vaping Use   Vaping status: Never Used  Substance and Sexual Activity   Alcohol use: Not Currently    Comment: ocassionally   Drug use: No   Sexual activity: Not Currently    Partners: Male    Birth control/protection: Surgical    Comment: hysterectomy  Other Topics Concern   Not on file  Social History Narrative   She lives with husband in [place] home.  They have one grown son and 2 grandchildren.   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   Highest of level education:  Some college.   Never smoker.   Alcohol: rare.   Social Drivers of Corporate investment banker Strain: Low Risk  (01/21/2024)   Overall Financial Resource Strain (CARDIA)    Difficulty of Paying Living Expenses: Not hard at all  Food Insecurity: No Food Insecurity (01/21/2024)   Hunger Vital Sign    Worried About Running Out of Food in the Last Year: Never true    Ran Out of Food in the Last Year: Never true  Transportation Needs: No Transportation Needs (01/21/2024)   PRAPARE - Administrator, Civil Service (Medical): No    Lack of Transportation (Non-Medical): No  Physical Activity: Inactive (01/21/2024)   Exercise Vital Sign    Days of Exercise per Week: 0 days    Minutes of Exercise per Session: 0 min  Stress: No Stress Concern Present (01/21/2024)   [employer] of  Occupational Health - Occupational Stress Questionnaire    Feeling of Stress : Not at all  Social Connections: Moderately Isolated (01/21/2024)   Social Connection and Isolation Panel [NHANES]    Frequency of Communication with Friends and Family: Three times a week    Frequency of Social Gatherings with Friends and Family: Three times a week    Attends Religious Services: Never    Active Member of Clubs or Organizations: No    Attends Engineer, structural: Never    Marital Status: Married    Tobacco Counseling Counseling given: Not Answered   Clinical Intake:  Pre-visit preparation completed: Yes  Pain : No/denies pain     Diabetes: Yes CBG done?: No Did pt. bring in CBG monitor from home?: No  How often do you need to have someone help you when you read instructions, pamphlets, or other written materials from your doctor or pharmacy?: 1 - Never  Interpreter Needed?: No  Information entered by :: Kieth Pelt LPN   Activities of Daily Living    01/21/2024    2:39 PM 10/31/2023    9:00 PM  In your present state of health, do you have any difficulty performing the following activities:  Hearing? 0 0  Vision? 0 0  Difficulty concentrating or making decisions? 1 0  Walking or climbing stairs? 1   Dressing or bathing? 0   Doing errands, shopping? 1 0  Preparing Food and eating ? N  Using the Toilet? N   In the past six months, have you accidently leaked urine? Y   Do you have problems with loss of bowel control? N   Managing your Medications? N   Managing your Finances? Y   Housekeeping or managing your Housekeeping? N     Patient Care Team: Mandy Second, [place] as PCP - General (Physician Assistant) Audery Blazing Deannie Fabian, MD as PCP - Cardiology (Cardiology) Ruby Corporal, MD (Inactive) (Gastroenterology) Audery Blazing Deannie Fabian, MD as Consulting Physician (Cardiology) Maris Sickle, MD as Consulting Physician (Ophthalmology) Lillian Rein, MD (Obstetrics and  Gynecology) Clerance Dais, MD (Inactive) (Orthopedic Surgery) Wash Hack, MD as Referring Physician (Dermatology) Claudetta Cuba, MD as Consulting Physician (Neurosurgery) Alanson Alliance, MD as Consulting Physician (Rheumatology) Ammon Bales, MD (Inactive) as Consulting Physician (Otolaryngology) Liliane Rei, MD as Consulting Physician (Orthopedic Surgery)  Indicate any recent Medical Services you may have received from other than [facility] providers in the past year (date may be approximate).     Assessment:   This is a routine wellness examination for [place].  Hearing/Vision screen Hearing Screening - Comments:: No trouble hearing Vision Screening - Comments:: Up to date groat   Goals Addressed             This Visit's Progress    Patient Stated   On track    None at this time      Patient Stated       Maintain current health       Depression Screen    01/21/2024    2:40 PM 01/15/2023    1:31 PM 11/28/2022    1:08 PM 06/24/2022    3:34 PM 02/20/2022    1:15 PM 12/11/2021    2:02 PM 08/21/2021   10:54 AM  PHQ 2/9 Scores  PHQ - 2 Score 0 0 0 0 0 0 0  PHQ- 9 Score 5          Fall Risk    01/21/2024    2:33 PM 05/29/2023    1:12 PM 01/15/2023    1:33 PM 11/28/2022    1:08 PM 02/20/2022    1:15 PM  Fall Risk   Falls in the past year? 1 1 1  0 0  Number falls in past yr: 0 0 0 0 0  Injury with Fall? 1 1 0 0 0  Comment  left hip pain     Risk for fall due to : Impaired balance/gait No Fall Risks Impaired balance/gait;Impaired vision;Impaired mobility Impaired balance/gait;Impaired vision Impaired vision;Impaired balance/gait  Follow up Falls evaluation completed;Education provided;Falls prevention discussed Falls evaluation completed Falls prevention discussed Falls evaluation completed Falls evaluation completed    MEDICARE RISK AT HOME: Medicare Risk at Home Any stairs in or around the home?: No If so, are there any without handrails?:  No Home free of loose throw rugs in walkways, pet beds, electrical cords, etc?: Yes Adequate lighting in your home to reduce risk of falls?: Yes Life alert?: No Use of a cane, walker or w/c?: Yes Grab bars in the bathroom?: Yes Shower chair or bench in shower?: Yes Elevated toilet seat or a handicapped toilet?: Yes  TIMED UP AND GO:  Was the test performed?  No    Cognitive Function:        01/21/2024    2:36 PM 01/15/2023    1:34 PM 12/11/2021    2:01 PM  6CIT Screen  What Year? 4 points 0 points 0 points  What month? 0 points 0 points 0 points  What time? 0 points 0 points 0 points  Count back from 20 0 points 0 points 0 points  Months in reverse 4 points 0 points 0 points  Repeat phrase 6 points 0 points 0 points  Total Score 14 points 0 points 0 points    Immunizations Immunization History  Administered Date(s) Administered   Fluad Quad(high Dose 65+) 08/18/2019, 08/18/2020, 08/21/2021, 08/28/2022   Hepatitis B, ADULT 10/26/2020, 02/21/2021, 05/23/2021   Influenza Split 08/17/2012   Influenza Whole 06/30/2009, 07/12/2010   Influenza, High Dose Seasonal PF 08/29/2015, 09/09/2016, 08/13/2017, 06/29/2018   Influenza,inj,Quad PF,6+ Mos 06/08/2013, 08/18/2014   Moderna Sars-Covid-2 Vaccination 12/07/2019, 01/11/2020   Pneumococcal Conjugate-13 08/18/2014   Pneumococcal Polysaccharide-23 11/26/2016   Td 09/30/2008   Tdap 04/16/2019   Zoster Recombinant(Shingrix) 08/21/2021, 11/22/2021   Zoster, Live 08/17/2012    TDAP status: Up to date  Flu Vaccine status: Up to date  Pneumococcal vaccine status: Up to date  Covid-19 vaccine status: Information provided on how to obtain vaccines.   Qualifies for Shingles Vaccine? No   Zostavax completed Yes   Shingrix Completed?: Yes  Screening Tests Health Maintenance  Topic Date Due   COVID-19 Vaccine (3 - Moderna risk series) 02/08/2020   Diabetic kidney evaluation - Urine ACR  08/29/2023   INFLUENZA VACCINE   04/30/2024   HEMOGLOBIN A1C  05/17/2024   OPHTHALMOLOGY EXAM  05/27/2024   Diabetic kidney evaluation - eGFR measurement  11/16/2024   FOOT EXAM  11/16/2024   Medicare Annual Wellness (AWV)  01/20/2025   Colonoscopy  09/15/2025   DTaP/Tdap/Td (3 - Td or Tdap) 04/15/2029   Pneumonia Vaccine 60+ Years old  Completed   DEXA SCAN  Completed   Hepatitis C Screening  Completed   Zoster Vaccines- Shingrix  Completed   HPV VACCINES  Aged Out   Meningococcal B Vaccine  Aged Out    Health Maintenance  Health Maintenance Due  Topic Date Due   COVID-19 Vaccine (3 - Moderna risk series) 02/08/2020   Diabetic kidney evaluation - Urine ACR  08/29/2023    Colorectal cancer screening: No longer required.   Mammogram status: Completed  . Repeat every year  Bone Density status: Completed 2024. Results reflect: Bone density results: OSTEOPENIA. Repeat every 2 years.  Lung Cancer Screening: (Low Dose CT Chest recommended if Age 9-80 years, 20 pack-year currently smoking OR have quit w/in 15years.) does not qualify.   Lung Cancer Screening Referral:   Additional Screening:  Hepatitis C Screening: does not qualify; Completed 2021  Vision Screening: Recommended annual ophthalmology exams for early detection of glaucoma and other disorders of the eye. Is the patient up to date with their annual eye exam?  Yes  Who is the provider or what is the name of the office in which the patient attends annual eye exams? [facility] If pt is not established with a provider, would they like to be referred to a provider to establish care? No .   Dental Screening: Recommended annual dental exams for proper oral hygiene  Nutrition Risk Assessment:  Has the patient had any N/V/D within the last 2 months?  No  Does the patient have any non-healing wounds?  No  Has the patient had any unintentional weight loss or weight gain?  No   Diabetes:  Is the patient diabetic?  Yes  If diabetic, was a CBG obtained today?   No  Did the patient bring in their glucometer from  home?  No  How often do you monitor your CBG's? monitor.   Financial Strains and Diabetes Management:  Are you having any financial strains with the device, your supplies or your medication? No .  Does the patient want to be seen by Chronic Care Management for management of their diabetes?  No  Would the patient like to be referred to a Nutritionist or for Diabetic Management?  No   Diabetic Exams:  Diabetic Eye Exam: Pt has been advised about the importance in completing this   Diabetic Foot Exam: Pt has been advised about the importance in completing this exam.   Community Resource Referral / Chronic Care Management: CRR required this visit?  No   CCM required this visit?  No     Plan:     I have personally reviewed and noted the following in the patient's chart:   Medical and social history Use of alcohol, tobacco or illicit drugs  Current medications and supplements including opioid prescriptions. Patient is not currently taking opioid prescriptions. Functional ability and status Nutritional status Physical activity Advanced directives List of other physicians Hospitalizations, surgeries, and ER visits in previous 12 months Vitals Screenings to include cognitive, depression, and falls Referrals and appointments  In addition, I have reviewed and discussed with patient certain preventive protocols, quality metrics, and best practice recommendations. A written personalized care plan for preventive services as well as general preventive health recommendations were provided to patient.     Kieth Pelt, LPN   1/61/0960   After Visit Summary: (MyChart) Due to this being a telephonic visit, the after visit summary with patients personalized plan was offered to patient via MyChart   Nurse Notes:

## 2024-01-21 NOTE — Patient Instructions (Signed)
 [name] , Thank you for taking time to come for your Medicare Wellness Visit. I appreciate your ongoing commitment to your health goals. Please review the following plan we discussed and let me know if I can assist you in the future.   Screening recommendations/referrals: Colonoscopy: no longer required Mammogram:  Bone Density:  Recommended yearly ophthalmology/optometry visit for glaucoma screening and checkup Recommended yearly dental visit for hygiene and checkup  Vaccinations: Influenza vaccine:  Pneumococcal vaccine:  Tdap vaccine:  Shingles vaccine:       Preventive Care 65 Years and Older, Female Preventive care refers to lifestyle choices and visits with your health care provider that can promote health and wellness. What does preventive care include? A yearly physical exam. This is also called an annual well check. Dental exams once or twice a year. Routine eye exams. Ask your health care provider how often you should have your eyes checked. Personal lifestyle choices, including: Daily care of your teeth and gums. Regular physical activity. Eating a healthy diet. Avoiding tobacco and drug use. Limiting alcohol use. Practicing safe sex. Taking low-dose aspirin  every day. Taking vitamin and mineral supplements as recommended by your health care provider. What happens during an annual well check? The services and screenings done by your health care provider during your annual well check will depend on your age, overall health, lifestyle risk factors, and family history of disease. Counseling  Your health care provider may ask you questions about your: Alcohol use. Tobacco use. Drug use. Emotional well-being. Home and relationship well-being. Sexual activity. Eating habits. History of falls. Memory and ability to understand (cognition). Work and work Astronomer. Reproductive health. Screening  You may have the following tests or measurements: Height, weight,  and BMI. Blood pressure. Lipid and cholesterol levels. These may be checked every 5 years, or more frequently if you are over 63 years old. Skin check. Lung cancer screening. You may have this screening every year starting at age 27 if you have a 30-pack-year history of smoking and currently smoke or have quit within the past 15 years. Fecal occult blood test (FOBT) of the stool. You may have this test every year starting at age 73. Flexible sigmoidoscopy or colonoscopy. You may have a sigmoidoscopy every 5 years or a colonoscopy every 10 years starting at age 54. Hepatitis C blood test. Hepatitis B blood test. Sexually transmitted disease (STD) testing. Diabetes screening. This is done by checking your blood sugar (glucose) after you have not eaten for a while (fasting). You may have this done every 1-3 years. Bone density scan. This is done to screen for osteoporosis. You may have this done starting at age 35. Mammogram. This may be done every 1-2 years. Talk to your health care provider about how often you should have regular mammograms. Talk with your health care provider about your test results, treatment options, and if necessary, the need for more tests. Vaccines  Your health care provider may recommend certain vaccines, such as: Influenza vaccine. This is recommended every year. Tetanus, diphtheria, and acellular pertussis (Tdap, Td) vaccine. You may need a Td booster every 10 years. Zoster vaccine. You may need this after age 79. Pneumococcal 13-valent conjugate (PCV13) vaccine. One dose is recommended after age 59. Pneumococcal polysaccharide (PPSV23) vaccine. One dose is recommended after age 97. Talk to your health care provider about which screenings and vaccines you need and how often you need them. This information is not intended to replace advice given to you by your  health care provider. Make sure you discuss any questions you have with your health care provider. Document  Released: 10/13/2015 Document Revised: 06/05/2016 Document Reviewed: 07/18/2015 Elsevier Interactive Patient Education  2017 [employer].  Fall Prevention in the Home Falls can cause injuries. They can happen to people of all ages. There are many things you can do to make your home safe and to help prevent falls. What can I do on the outside of my home? Regularly fix the edges of walkways and driveways and fix any cracks. Remove anything that might make you trip as you walk through a door, such as a raised step or threshold. Trim any bushes or trees on the path to your home. Use bright outdoor lighting. Clear any walking paths of anything that might make someone trip, such as rocks or tools. Regularly check to see if handrails are loose or broken. Make sure that both sides of any steps have handrails. Any raised decks and porches should have guardrails on the edges. Have any leaves, snow, or ice cleared regularly. Use sand or salt on walking paths during winter. Clean up any spills in your garage right away. This includes oil or grease spills. What can I do in the bathroom? Use night lights. Install grab bars by the toilet and in the tub and shower. Do not use towel bars as grab bars. Use non-skid mats or decals in the tub or shower. If you need to sit down in the shower, use a plastic, non-slip stool. Keep the floor dry. Clean up any water  that spills on the floor as soon as it happens. Remove soap buildup in the tub or shower regularly. Attach bath mats securely with double-sided non-slip rug tape. Do not have throw rugs and other things on the floor that can make you trip. What can I do in the bedroom? Use night lights. Make sure that you have a light by your bed that is easy to reach. Do not use any sheets or blankets that are too big for your bed. They should not hang down onto the floor. Have a firm chair that has side arms. You can use this for support while you get dressed. Do  not have throw rugs and other things on the floor that can make you trip. What can I do in the kitchen? Clean up any spills right away. Avoid walking on wet floors. Keep items that you use a lot in easy-to-reach places. If you need to reach something above you, use a strong step stool that has a grab bar. Keep electrical cords out of the way. Do not use floor polish or wax that makes floors slippery. If you must use wax, use non-skid floor wax. Do not have throw rugs and other things on the floor that can make you trip. What can I do with my stairs? Do not leave any items on the stairs. Make sure that there are handrails on both sides of the stairs and use them. Fix handrails that are broken or loose. Make sure that handrails are as long as the stairways. Check any carpeting to make sure that it is firmly attached to the stairs. Fix any carpet that is loose or worn. Avoid having throw rugs at the top or bottom of the stairs. If you do have throw rugs, attach them to the floor with carpet tape. Make sure that you have a light switch at the top of the stairs and the bottom of the stairs. If you do  not have them, ask someone to add them for you. What else can I do to help prevent falls? Wear shoes that: Do not have high heels. Have rubber bottoms. Are comfortable and fit you well. Are closed at the toe. Do not wear sandals. If you use a stepladder: Make sure that it is fully opened. Do not climb a closed stepladder. Make sure that both sides of the stepladder are locked into place. Ask someone to hold it for you, if possible. Clearly mark and make sure that you can see: Any grab bars or handrails. First and last steps. Where the edge of each step is. Use tools that help you move around (mobility aids) if they are needed. These include: Canes. Walkers. Scooters. Crutches. Turn on the lights when you go into a dark area. Replace any light bulbs as soon as they burn out. Set up your  furniture so you have a clear path. Avoid moving your furniture around. If any of your floors are uneven, fix them. If there are any pets around you, be aware of where they are. Review your medicines with your doctor. Some medicines can make you feel dizzy. This can increase your chance of falling. Ask your doctor what other things that you can do to help prevent falls. This information is not intended to replace advice given to you by your health care provider. Make sure you discuss any questions you have with your health care provider. Document Released: 07/13/2009 Document Revised: 02/22/2016 Document Reviewed: 10/21/2014 Elsevier Interactive Patient Education  2017 [employer].

## 2024-01-22 ENCOUNTER — Ambulatory Visit (HOSPITAL_COMMUNITY)

## 2024-01-22 ENCOUNTER — Encounter (HOSPITAL_COMMUNITY): Payer: Self-pay

## 2024-01-22 DIAGNOSIS — I890 Lymphedema, not elsewhere classified: Secondary | ICD-10-CM

## 2024-01-22 DIAGNOSIS — E669 Obesity, unspecified: Secondary | ICD-10-CM | POA: Diagnosis not present

## 2024-01-22 DIAGNOSIS — I872 Venous insufficiency (chronic) (peripheral): Secondary | ICD-10-CM | POA: Diagnosis not present

## 2024-01-22 NOTE — Therapy (Signed)
 OUTPATIENT PHYSICAL THERAPY LYMPHEDEMA TREATMENT  Patient Name: [name] MRN: [id number] DOB:1946-03-19, 78 y.o., female Today's Date: 01/22/2024  END OF SESSION:  PT End of Session - 01/22/24 1453     Visit Number 2    Number of Visits 18    Date for PT Re-Evaluation 03/02/24    Authorization Type [employer]    PT Start Time 1450    PT Stop Time 1553    PT Time Calculation (min) 63 min    Activity Tolerance Patient tolerated treatment well    Behavior During Therapy [facility] for tasks assessed/performed             Past Medical History:  Diagnosis Date   Carpal tunnel syndrome of right wrist 03/2013   recurrent   Chronic diastolic heart failure (HCC)    Cirrhosis, 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 ? to get liver bx if she gets bariatric surgery? Mild portal hypertensive gastropathy on EGD 08/2019.   Fibromyalgia    GAD (generalized anxiety disorder)    GERD    History of hiatal hernia    History of iron  deficiency anemia 12/2018   Inadequate absorption secondary to chronic/long term PPI therapy + portal hypertensive gastroduodenopathy. No GIB found on EGD, colonosc, and givens. Iron  infusions X multiple.   History of thrombocytopenia 12/2011   Hyperlipidemia    Intolerant of statins   HYPERTENSION    IBS (irritable bowel syndrome)    -D.  Good response to bentyl  and imodium  as of 06/2018 GI f/u.   IDDM (insulin  dependent diabetes mellitus)    with DPN (managed by Dr. Aldona Amel but then in 2018 pt preferred to have me manage for her convenience)   Limited mobility    Requires a walker for arthritic pain, widespread musculoskeletal pain, and neuropathic pain.   Morbid obesity (HCC)    As of 11/2018, pt considering sleeve gastectomy vs bipass as of eval by Dr. Molli Angelucci considering as of 12/2019.   Nonalcoholic steatohepatitis    Viral Hep screens NEG.  CT 2015.  Transaminasemia.  U/S 07/2018 showed early changes of  cirrhosis.   OSA (obstructive sleep apnea) 09/14/2015   sleep study 09/07/15: severe obstructive sleep apnea with an AHI of 72 and SaO2 low of 75%.>referred to sleep MD   Osteoarthritis    hips, shoulders, knees   PAF (paroxysmal atrial fibrillation) (HCC)    One documented episode (after getting EGD 2016).  Was on amiodarone  x 3 mo.  Rate control with metoprolol  + anticoag with xarelto . Watchman 12/2022, off anticoag   Portal hypertensive gastropathy (HCC)    hemorrhagic gastropathy + non-bleeding gastric ulcers on EGD 08/2023   Recurrent epistaxis    Granuloma in L nare cauterized by ENT 04/2020. Another cautery 06/2020   Small fiber neuropathy    Due to DM.  Symmetric hands and feet tingling/numbness.   Ulcerative colitis (HCC)    Remicade  infusion Q 8 weeks: in clinical and endoscopic remission as of 12/2018 GI f/u.  06/11/19 rpt colonoscopy->cecal and ascending colon colitis.   Past Surgical History:  Procedure Laterality Date   ABDOMINAL HYSTERECTOMY  1980   Paps no longer indicated.   BACK SURGERY     BACTERIAL OVERGROWTH TEST N/A 07/13/2015   Procedure: BACTERIAL OVERGROWTH TEST;  Surgeon: Ruby Corporal, MD;  [facility];  Service: Endoscopy;  Laterality: N/A;  730     BILATERAL SALPINGOOPHORECTOMY  02/10/2001   BIOPSY  06/11/2019  Procedure: BIOPSY;  Surgeon: Ruby Corporal, MD;  [facility];  Service: Endoscopy;;  colon   BIOPSY  09/27/2019   Procedure: BIOPSY;  Surgeon: Ruby Corporal, MD;  [facility];  Service: Endoscopy;;  gastric duodenal   BIOPSY  09/15/2020   Procedure: BIOPSY;  Surgeon: Urban Garden, MD;  [facility];  Service: Gastroenterology;;   BIOPSY  09/25/2023   Procedure: BIOPSY;  Surgeon: Urban Garden, MD;  [facility];  Service: Gastroenterology;;   BREAST REDUCTION SURGERY  1994   bilat   BREAST SURGERY     CARDIOVASCULAR STRESS TEST  07/2010   Lexiscan myoview:  normal   CARPAL TUNNEL RELEASE Right 1996   CARPAL TUNNEL RELEASE Left 03/21/2003   CARPAL TUNNEL RELEASE Right 05/04/2013   Procedure: CARPAL TUNNEL RELEASE;  Surgeon: Amelie Baize., MD;  Location: [facility];  Service: Orthopedics;  Laterality: Right;   CARPAL TUNNEL RELEASE Left 09/21/2013   Procedure: LEFT CARPAL TUNNEL RELEASE;  Surgeon: Amelie Baize., MD;  Location: [facility];  Service: Orthopedics;  Laterality: Left;   CHOLECYSTECTOMY     COLONOSCOPY WITH PROPOFOL  N/A 08/04/2015   Colitis in remission.  No polyps.  Procedure: COLONOSCOPY WITH PROPOFOL ;  Surgeon: Ruby Corporal, MD;  [facility]: [facility];  Service: Endoscopy;  Laterality: N/A;  cecum time in  0820   time out  0827    total time 7 minutes   COLONOSCOPY WITH PROPOFOL  N/A 06/11/2019   cecal and ascending colon colitis.  Procedure: COLONOSCOPY WITH PROPOFOL ;  Surgeon: Ruby Corporal, MD;  [facility];  Service: Endoscopy;  Laterality: N/A;  730a   COLONOSCOPY WITH PROPOFOL  N/A 09/15/2020   Procedure: COLONOSCOPY WITH PROPOFOL ;  Surgeon: Urban Garden, MD;  [facility];  Service: Gastroenterology;  Laterality: N/A;  1030   ESOPHAGEAL DILATION N/A 08/04/2015   Procedure: ESOPHAGEAL DILATION;  Surgeon: Ruby Corporal, MD;  [facility];  Service: Endoscopy;  Laterality: N/AFelicita Horns, no mucousal disruption   ESOPHAGOGASTRODUODENOSCOPY  09/27/2019   Performed for IDA.  Esoph dilation was done but no stricture present.  Mild portal hypertensive gastropathy, o/w normal.  Duodenal bx NEG.  h pylori neg.   ESOPHAGOGASTRODUODENOSCOPY     09/25/2023 inactive chronic gastritis, h pylor neg. benign   ESOPHAGOGASTRODUODENOSCOPY     09/25/23 hemorrhagic portal gastropathy + nonbleeding gastric ulcers (H pylori neg)   ESOPHAGOGASTRODUODENOSCOPY (EGD) WITH ESOPHAGEAL DILATION  12/02/2005   ESOPHAGOGASTRODUODENOSCOPY (EGD) WITH PROPOFOL  N/A 08/04/2015    Procedure: ESOPHAGOGASTRODUODENOSCOPY (EGD) WITH PROPOFOL ;  Surgeon: Ruby Corporal, MD;  [facility];  Service: Endoscopy;  Laterality: N/A;  procedure 1   ESOPHAGOGASTRODUODENOSCOPY (EGD) WITH PROPOFOL  N/A 09/27/2019   Procedure: ESOPHAGOGASTRODUODENOSCOPY (EGD) WITH PROPOFOL ;  Surgeon: Ruby Corporal, MD;  [facility];  Service: Endoscopy;  Laterality: N/A;  12:10   ESOPHAGOGASTRODUODENOSCOPY (EGD) WITH PROPOFOL  N/A 10/31/2021   Procedure: ESOPHAGOGASTRODUODENOSCOPY (EGD) WITH PROPOFOL ;  Surgeon: Ruby Corporal, MD;  [facility];  Service: Endoscopy;  Laterality: N/A;  930   ESOPHAGOGASTRODUODENOSCOPY (EGD) WITH PROPOFOL  N/A 09/25/2023   Procedure: ESOPHAGOGASTRODUODENOSCOPY (EGD) WITH PROPOFOL ;  Surgeon: Urban Garden, MD;  [facility];  Service: Gastroenterology;  Laterality: N/A;  10:30AM;ASA 3   FLEXIBLE SIGMOIDOSCOPY  01/17/2012   Procedure: FLEXIBLE SIGMOIDOSCOPY;  Surgeon: Ruby Corporal, MD;  [facility];  Service: Endoscopy;  Laterality: N/A;   GIVENS CAPSULE STUDY N/A 08/03/2019   Procedure: GIVENS CAPSULE STUDY (performed for IDA)->some food debris in stomach and small amount of blood.  Surgeon: Ruby Corporal, MD;  [facility];  Service: Endoscopy;  Laterality: N/A;  730AM   HEMILAMINOTOMY LUMBAR SPINE Bilateral 09/07/1999   L4-5   HOT HEMOSTASIS  09/25/2023   Procedure: HOT HEMOSTASIS (ARGON PLASMA COAGULATION/BICAP);  Surgeon: Umberto Ganong, Bearl Limes, MD;  [facility];  Service: Gastroenterology;;   KNEE ARTHROSCOPY Right 01/1999; 10/2000   LEFT ATRIAL APPENDAGE OCCLUSION N/A 01/23/2023   Procedure: LEFT ATRIAL APPENDAGE OCCLUSION;  Surgeon: Arnoldo Lapping, MD;  Location: [facility]ASIVE [facility];  Service: Cardiovascular;  Laterality: N/A;   LEFT ATRIAL APPENDAGE OCCLUSION     LYSIS OF ADHESION  02/10/2001   MALONEY DILATION  09/27/2019   Procedure: MALONEY DILATION;  Surgeon: Ruby Corporal, MD;  [facility];  Service: Endoscopy;;   NASAL ENDOSCOPY WITH EPISTAXIS CONTROL Bilateral 01/25/2022   Procedure: NASAL ENDOSCOPY WITH EPISTAXIS CONTROL;  Surgeon: Ammon Bales, MD;  Location: [facility];  Service: ENT;  Laterality: Bilateral;   NASAL SEPTOPLASTY W/ TURBINOPLASTY Bilateral 01/25/2022   Procedure: NASAL SEPTOPLASTY WITH TURBINATE REDUCTION;  Surgeon: Ammon Bales, MD;  Location: [facility] OR;  Service: ENT;  Laterality: Bilateral;   RECTOCELE REPAIR  1990; 09/12/2006   TARSAL TUNNEL RELEASE  2002   TEE WITHOUT CARDIOVERSION N/A 01/23/2023   Procedure: TRANSESOPHAGEAL ECHOCARDIOGRAM;  Surgeon: Arnoldo Lapping, MD;  Location: [facility] INVASIVE CV LAB;  Service: Cardiovascular;  Laterality: N/A;   TRANSESOPHAGEAL ECHOCARDIOGRAM (CATH LAB) N/A 08/26/2023   4mm leak around watchman device. Procedure: TRANSESOPHAGEAL ECHOCARDIOGRAM;  Surgeon: Euell Herrlich, MD;  Location: [facility] INVAS[facility] CV LAB;  Service: Cardiovascular;  Laterality: N/A;   TRANSTHORACIC ECHOCARDIOGRAM  08/04/2015   EF 60-65%, normal wall motion, mild LVH, mild LA dilation, grd I DD.  11/2023 overall poor quality, EF 55-60%   TUMOR EXCISION Left 03/21/2003   dorsal 1st web space (hand)   URETEROLYSIS Right 02/10/2001   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   Fecal urgency 08/02/2020   Deviated septum 07/28/2020   Epistaxis, recurrent 05/15/2020    Ulcerative colitis without complications (HCC) 08/24/2019   Gastrointestinal hemorrhage 08/24/2019   Iron  deficiency anemia due to chronic blood loss 07/28/2019   Iron  deficiency anemia 05/20/2019   Heme positive stool 05/20/2019   History of iron  deficiency anemia 12/2018   Vertigo 12/04/2015   Uncontrolled type 2 diabetes mellitus with peripheral neuropathy 11/14/2015   OSA (obstructive sleep apnea), severe 09/14/2015   B12 deficiency 08/17/2015   Vitamin B6 deficiency 08/17/2015   PAF (paroxysmal atrial fibrillation) (HCC) 08/17/2015   Chronic anticoagulation 08/17/2015   Atrial fibrillation with RVR (HCC) 08/04/2015   Dysphagia    Gastroesophageal reflux disease without esophagitis    Lymphocytosis 07/04/2015   Obesity 05/18/2014   Allergic sinusitis 12/09/2013   Myalgia 04/28/2012   Polyarthralgia 04/28/2012   Elevated LFTs  01/16/2012   Insomnia 01/12/2012   Small fiber neuropathy 05/30/2011   Anxiety 05/30/2011   Dyslipidemia 01/24/2011   PALPITATIONS, RECURRENT 08/08/2010   Essential hypertension 04/12/2010   GERD 04/12/2010   Ulcerative colitis (HCC) 04/12/2010    PCP: Mandy Second, PA  REFERRING PROVIDER: Mandy Second, PA  REFERRING DIAG: I87.2 (ICD-10-CM) - Venous insufficiency of both lower extremities I89.0,E66.9 (ICD-10-CM) - Lymphedema associated with obesity I87.2 (ICD-10-CM) - Venous stasis dermatitis of both lower extremities  THERAPY DIAG:  Lymphedema, not elsewhere classified  Rationale for Evaluation and Treatment: Rehabilitation  ONSET DATE: chronic but has increased significantly in the past year.   SUBJECTIVE:                                                                                                                                                                                           SUBJECTIVE STATEMENT: 01/22/24:  Pt arrived with spouse.  Reports they ordered bandages over phone with lymphedema products.  No reports of pain.   Has began exercises at home without questions.  Eval:  She notes swelling in her legs, particularly in the feet, which has worsened since her discharge from the hospital in January. She is currently taking furosemide  60 mg daily, taking three 20mg  tabs in the morning and three in the evening, but has not noticed significant improvement in the swelling.   Pt states that her mother had large legs.  She has been elevating her legs and completing ankle pumps since the beginning of February without improvement.  She has tried compression but her legs are to big for the garments at this time. Pt states that her edema is from the knees down.  Pt states that she is now in a size up from her normal shoe size and has to take the shoe laces out to fit in them.      PERTINENT HISTORY:  MEEKA CARTELLI is a 78 y.o. female with past medical history significant for chronic diastolic congestive heart failure, proximal atrial fibrillation not on anticoagulation due to recurrent epistaxis s/p Watchman procedure (12/2022), CAD on aspirin , type 2 diabetes mellitus, anxiety/depression, nonalcoholic cirrhosis, OSA on CPAP, morbid obesity hospitalized for acute CHF from 10/31/23-11/04/2574 .     PAIN:  Are you having pain?   NPRS scale: 0/10  PRECAUTIONS: Other: cellulitis    RED FLAGS: None   WEIGHT BEARING RESTRICTIONS: No  FALLS:  Has patient fallen in last 6 months? No  LIVING ENVIRONMENT: Lives with: lives with their family Lives in: House/apartment Stairs: Yes: Internal: 2 steps; none and External: 2 steps; none Has following equipment at home:  Quad cane small base  PATIENT GOALS: less swelling    OBJECTIVE: Note: Objective measures were completed at Evaluation unless otherwise noted.  COGNITION: Overall cognitive status: Within functional limits for tasks assessed   PALPATION: Increased induration greater in LE but noted in thigh area as well   OBSERVATIONS / OTHER ASSESSMENTS: slight redness and  thickening of skin   POSTURE: forward head, rounded shoulders and increased kyphosis  All within normal limits:   LYMPHEDEMA ASSESSMENTS:    LE LANDMARK RIGHT eval  At groin   30 cm proximal to suprapatella   20 cm proximal to suprapatella   10 cm proximal to suprapatella   At midpatella / popliteal crease 52.5  30 cm proximal to floor at lateral plantar foot 53.5  20 cm proximal to floor at lateral plantar foot 44   10 cm proximal to floor at lateral plantar foot 29  Circumference of ankle/heel 33.5  5 cm proximal to 1st MTP joint 24  Across MTP joint 23  Around proximal great toe   (Blank rows = not tested)  LE LANDMARK LEFT eval  At groin   30 cm proximal to suprapatella   20 cm proximal to suprapatella   10 cm proximal to suprapatella   At midpatella / popliteal crease 53  30 cm proximal to floor at lateral plantar foot 54.2  20 cm proximal to floor at lateral plantar foot 40.5  10 cm proximal to floor at lateral plantar foot 32.3  Circumference of ankle/heel 35.4  5 cm proximal to 1st MTP joint 24.3  Across MTP joint 23.3  Around proximal great toe   (Blank rows = not tested)  FUNCTIONAL TESTS:  Gait : 1:30 minutes to ambulate with quad cane x 80 ft    TODAY'S TREATMENT:                                                                                                                              DATE:  01/22/24:  Educated 4 components of lymphedema treatment Cut foam (under cabinet) Reviewed HEP including: ankle pumps, LAQ, hip ab/adduction, marching and diaphragm breath Manual decongestive massage for inguinal to axillary anastomosis including short neck, superficial then deep abdominal, axillary, BLE anterior supine and posterior in sidelying Reviewed self manual and given handout   01/20/2024 : what lymphedema is and how it is controled, LE exercises, what bandages to order.    PATIENT EDUCATION:  Education details: skin care recommending using Eucerin or  Aquaphor, exercise to include ankle pumps, LAQ, hip ab/adduction, marching and diaphragm breath, what manual will be like as well as multilayer compression dressing.  Person educated: Patient and Spouse Education method: Explanation, Verbal cues, and Handouts Education comprehension: verbalized understanding and returned demonstration  HOME EXERCISE PROGRAM: ankle pumps, LAQ, hip ab/adduction, marching and diaphragm breath  ASSESSMENT:  CLINICAL IMPRESSION: 01/22/24:  Educated 4 components of lymphedema care.  Manual decongestive technqiues complete anterior and posterior for inguinal  to axillary anastomosis.  Educated self manual and handout given to begin at home.  Foam cut for Bil thighs and placed under cabinet.  Husband stated he order short stretch bandages yesterday through lymphedema products.  Eval:  Patient is a 78 y.o. female who was seen today for physical therapy evaluation and treatment for B LE lymphedema.  Evaluation demonstrates increased swelling of B LE with skin thickening and induration.  She has some redness along the anterior aspect of both LE.  Therapist explained that there is induration in pt thigh area as well and this area would benefit from decongestive techniques as well.  Ms Rosello will decide if she is interested in compression bandaging to the thigh level or if she will wear capri compression.  Ms. Santiago has recently been hospitalized for acute CHF therefore the therapist messaged  her cardiologist  to see if she is able to go thru total decongestive techniques. (Amy Micael Adas was CC on this due to therapist being out of town next week.   OBJECTIVE IMPAIRMENTS: cardiopulmonary status limiting activity, decreased activity tolerance, decreased mobility, difficulty walking, decreased ROM, decreased strength, increased edema, and obesity.   ACTIVITY LIMITATIONS: carrying, lifting, stairs, bathing, dressing, and locomotion level  PARTICIPATION LIMITATIONS: shopping and  community activity  PERSONAL FACTORS: Age, Fitness, Time since onset of injury/illness/exacerbation, and 1-2 comorbidities: CHF,   are also affecting patient's functional outcome.   REHAB POTENTIAL: Good  CLINICAL DECISION MAKING: Evolving/moderate complexity  EVALUATION COMPLEXITY: Moderate  GOALS: Goals reviewed with patient? Yes  SHORT TERM GOALS: Target date: 02/10/24  PT to be I in HEP to improve lymphatic circulation Baseline: Goal status: INITIAL  2.  PT to have lost 1-2 cm in her feet to be able to fit in her shoes, 2-3 cm in legs for improved skin integrity. Baseline:  Goal status: INITIAL   LONG TERM GOALS: Target date: 03/02/24  PT to have lost 2 -3 cm in her feet, 3-5 cm in her LE for decreased risk of cellulitis.  Baseline:  Goal status: INITIAL  2.  Pt to have aqcuired juxtafit compression garment and be able to don to be able to continue maintenance phase of treatment.  Baseline:  Goal status: INITIAL  3.  PT to be I in self manual to assist in increasing lymphatic circulation Baseline:  Goal status: INITIAL  PLAN:  PT FREQUENCY: 3x/week  PT DURATION: 6 weeks  PLANNED INTERVENTIONS: 97110-Therapeutic exercises, 97535- Self Care, and 54098- Manual therapy  PLAN FOR NEXT SESSION:  begin manual and teaching self manual techniques.   Minor Amble, LPTA/CLT; Johnye Napoleon [phone]  01/22/2024, 4:19 PM

## 2024-01-23 ENCOUNTER — Ambulatory Visit: Admitting: Urgent Care

## 2024-01-23 VITALS — BP 116/70 | HR 76 | Wt 218.8 lb

## 2024-01-23 DIAGNOSIS — E1142 Type 2 diabetes mellitus with diabetic polyneuropathy: Secondary | ICD-10-CM

## 2024-01-23 DIAGNOSIS — E1140 Type 2 diabetes mellitus with diabetic neuropathy, unspecified: Secondary | ICD-10-CM

## 2024-01-23 DIAGNOSIS — E119 Type 2 diabetes mellitus without complications: Secondary | ICD-10-CM | POA: Diagnosis not present

## 2024-01-23 DIAGNOSIS — Z794 Long term (current) use of insulin: Secondary | ICD-10-CM

## 2024-01-23 MED ORDER — INSULIN LISPRO (1 UNIT DIAL) 100 UNIT/ML (KWIKPEN)
PEN_INJECTOR | SUBCUTANEOUS | 5 refills | Status: DC
Start: 2024-01-23 — End: 2024-05-26

## 2024-01-23 MED ORDER — FREESTYLE LIBRE 3 SENSOR MISC: 1.0000 | 3 refills | Status: DC

## 2024-01-23 MED ORDER — TRESIBA FLEXTOUCH 100 UNIT/ML ~~LOC~~ SOPN: 15.0000 [IU] | PEN_INJECTOR | Freq: Every day | SUBCUTANEOUS | 5 refills | Status: DC

## 2024-01-23 MED ORDER — FREESTYLE LIBRE 3 READER DEVI: 11 refills | Status: DC

## 2024-01-23 NOTE — Progress Notes (Signed)
 Established Patient Office Visit  Subjective:  Patient ID: [name], female    DOB: 1945-10-18  Age: 78 y.o. MRN: [id number]  Chief Complaint  Patient presents with   Follow-up    Follow up from last visit.    HPI  Discussed the use of AI scribe software for clinical note transcription with the patient, who gave verbal consent to proceed.  History of Present Illness   [name] is a 78 year old female with diabetes who presents with concerns about hypoglycemia.  She experiences episodes of hypoglycemia, primarily occurring at night, with blood sugar levels dropping into the low fourties to fifties. A significant episode occurred where it fell into the thirties, causing her to wake up disoriented. These episodes often occur when she stays up late and are sometimes preceded by her last meal at 5 or 6 PM, which includes protein. She takes insulin  three times a day, with 100 units at breakfast, lunch, and dinner, in addition to 5 mg of Farxiga  and 1.5 mg of Trulicity . No hypoglycemic episodes after breakfast or lunch. She reports consistent meal sizes throughout the day.  She has been experiencing shortness of breath, for which spironolactone  was added to her medication regimen. She continues to take Lasix , and her blood pressure is reported to be well-controlled. Significant improvement in her symptoms has been noted since the last visit. Additionally she is now attending the lymphedema clinic and has had several massages.  Her last A1c was 7.7, and she aims to reduce it to 6.5. She identifies fruit as a potential trigger for her blood sugar fluctuations, describing it as her 'comfort food'. She has a Dexcom G7 system for continuous glucose monitoring, which she has had for several months, however has yet to use it and continues using fingersticks.      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   Fecal urgency 08/02/2020   Deviated septum 07/28/2020   Epistaxis, recurrent 05/15/2020   Ulcerative colitis without complications (HCC) 08/24/2019   Gastrointestinal hemorrhage 08/24/2019   Iron  deficiency anemia due to chronic blood loss 07/28/2019   Iron  deficiency anemia 05/20/2019   Heme positive stool 05/20/2019   History of iron  deficiency anemia 12/2018   Vertigo 12/04/2015   Uncontrolled type 2 diabetes mellitus with peripheral neuropathy 11/14/2015   OSA (obstructive sleep apnea), severe 09/14/2015   B12 deficiency 08/17/2015   Vitamin B6 deficiency 08/17/2015   PAF (paroxysmal atrial fibrillation) (HCC) 08/17/2015   Chronic anticoagulation 08/17/2015   Atrial fibrillation with RVR (HCC) 08/04/2015   Dysphagia    Gastroesophageal reflux disease without esophagitis    Lymphocytosis 07/04/2015   Obesity 05/18/2014   Allergic sinusitis 12/09/2013   Myalgia 04/28/2012   Polyarthralgia 04/28/2012   Elevated LFTs 01/16/2012   Insomnia 01/12/2012   Small fiber neuropathy 05/30/2011   Anxiety 05/30/2011   Dyslipidemia 01/24/2011   PALPITATIONS, RECURRENT 08/08/2010   Essential hypertension 04/12/2010   GERD 04/12/2010   Ulcerative colitis (HCC)  04/12/2010   Past Medical History:  Diagnosis Date   Carpal tunnel syndrome of right wrist 03/2013   recurrent   Chronic diastolic heart failure (HCC)    Cirrhosis, 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 ? to get  liver bx if she gets bariatric surgery? Mild portal hypertensive gastropathy on EGD 08/2019.   Fibromyalgia    GAD (generalized anxiety disorder)    GERD    History of hiatal hernia    History of iron  deficiency anemia 12/2018   Inadequate absorption secondary to chronic/long term PPI therapy + portal hypertensive gastroduodenopathy. No GIB found on EGD, colonosc, and givens. Iron  infusions X multiple.   History of thrombocytopenia 12/2011   Hyperlipidemia    Intolerant of statins   HYPERTENSION    IBS (irritable bowel syndrome)    -D.  Good response to bentyl  and imodium  as of 06/2018 GI f/u.   IDDM (insulin  dependent diabetes mellitus)    with DPN (managed by Dr. Aldona Amel but then in 2018 pt preferred to have me manage for her convenience)   Limited mobility    Requires a walker for arthritic pain, widespread musculoskeletal pain, and neuropathic pain.   Morbid obesity (HCC)    As of 11/2018, pt considering sleeve gastectomy vs bipass as of eval by Dr. Molli Angelucci considering as of 12/2019.   Nonalcoholic steatohepatitis    Viral Hep screens NEG.  CT 2015.  Transaminasemia.  U/S 07/2018 showed early changes of cirrhosis.   OSA (obstructive sleep apnea) 09/14/2015   sleep study 09/07/15: severe obstructive sleep apnea with an AHI of 72 and SaO2 low of 75%.>referred to sleep MD   Osteoarthritis    hips, shoulders, knees   PAF (paroxysmal atrial fibrillation) (HCC)    One documented episode (after getting EGD 2016).  Was on amiodarone  x 3 mo.  Rate control with metoprolol  + anticoag with xarelto . Watchman 12/2022, off anticoag   Portal hypertensive gastropathy (HCC)    hemorrhagic gastropathy + non-bleeding gastric ulcers on EGD 08/2023   Recurrent epistaxis    Granuloma in L nare cauterized by ENT 04/2020. Another cautery 06/2020   Small fiber neuropathy    Due to DM.  Symmetric hands and feet tingling/numbness.   Ulcerative colitis (HCC)    Remicade  infusion Q 8 weeks: in clinical and  endoscopic remission as of 12/2018 GI f/u.  06/11/19 rpt colonoscopy->cecal and ascending colon colitis.   Past Surgical History:  Procedure Laterality Date   ABDOMINAL HYSTERECTOMY  1980   Paps no longer indicated.   BACK SURGERY     BACTERIAL OVERGROWTH TEST N/A 07/13/2015   Procedure: BACTERIAL OVERGROWTH TEST;  Surgeon: Ruby Corporal, MD;  [facility]: [facility];  Service: Endoscopy;  Laterality: N/A;  730     BILATERAL SALPINGOOPHORECTOMY  02/10/2001   BIOPSY  06/11/2019   Procedure: BIOPSY;  Surgeon: Ruby Corporal, MD;  [facility]: [facility];  Service: Endoscopy;;  colon   BIOPSY  09/27/2019   Procedure: BIOPSY;  Surgeon: Ruby Corporal, MD;  [facility]: [facility];  Service: Endoscopy;;  gastric duodenal   BIOPSY  09/15/2020   Procedure: BIOPSY;  Surgeon: Urban Garden, MD;  [facility]: [facility];  Service: Gastroenterology;;   BIOPSY  09/25/2023   Procedure: BIOPSY;  Surgeon: Urban Garden, MD;  [facility];  Service: Gastroenterology;;   BREAST REDUCTION SURGERY  1994   bilat   BREAST SURGERY  CARDIOVASCULAR STRESS TEST  07/2010   Lexiscan myoview: normal   CARPAL TUNNEL RELEASE Right 1996   CARPAL TUNNEL RELEASE Left 03/21/2003   CARPAL TUNNEL RELEASE Right 05/04/2013   Procedure: CARPAL TUNNEL RELEASE;  Surgeon: Amelie Baize., MD;  Location: [facility];  Service: Orthopedics;  Laterality: Right;   CARPAL TUNNEL RELEASE Left 09/21/2013   Procedure: LEFT CARPAL TUNNEL RELEASE;  Surgeon: Amelie Baize., MD;  Location: [facility];  Service: Orthopedics;  Laterality: Left;   CHOLECYSTECTOMY     COLONOSCOPY WITH PROPOFOL  N/A 08/04/2015   Colitis in remission.  No polyps.  Procedure: COLONOSCOPY WITH PROPOFOL ;  Surgeon: Ruby Corporal, MD;  Location: [facility];  Service: Endoscopy;  Laterality: N/A;  cecum time in  0820   time out  0827    total time 7 minutes   COLONOSCOPY WITH PROPOFOL   N/A 06/11/2019   cecal and ascending colon colitis.  Procedure: COLONOSCOPY WITH PROPOFOL ;  Surgeon: Ruby Corporal, MD;  [facility];  Service: Endoscopy;  Laterality: N/A;  730a   COLONOSCOPY WITH PROPOFOL  N/A 09/15/2020   Procedure: COLONOSCOPY WITH PROPOFOL ;  Surgeon: Urban Garden, MD;  [facility];  Service: Gastroenterology;  Laterality: N/A;  1030   ESOPHAGEAL DILATION N/A 08/04/2015   Procedure: ESOPHAGEAL DILATION;  Surgeon: Ruby Corporal, MD;  Location: [facility];  Service: Endoscopy;  Laterality: N/AFelicita Horns, no mucousal disruption   ESOPHAGOGASTRODUODENOSCOPY  09/27/2019   Performed for IDA.  Esoph dilation was done but no stricture present.  Mild portal hypertensive gastropathy, o/w normal.  Duodenal bx NEG.  h pylori neg.   ESOPHAGOGASTRODUODENOSCOPY     09/25/2023 inactive chronic gastritis, h pylor neg. benign   ESOPHAGOGASTRODUODENOSCOPY     09/25/23 hemorrhagic portal gastropathy + nonbleeding gastric ulcers (H pylori neg)   ESOPHAGOGASTRODUODENOSCOPY (EGD) WITH ESOPHAGEAL DILATION  12/02/2005   ESOPHAGOGASTRODUODENOSCOPY (EGD) WITH PROPOFOL  N/A 08/04/2015   Procedure: ESOPHAGOGASTRODUODENOSCOPY (EGD) WITH PROPOFOL ;  Surgeon: Ruby Corporal, MD;  Location: [facility];  Service: Endoscopy;  Laterality: N/A;  procedure 1   ESOPHAGOGASTRODUODENOSCOPY (EGD) WITH PROPOFOL  N/A 09/27/2019   Procedure: ESOPHAGOGASTRODUODENOSCOPY (EGD) WITH PROPOFOL ;  Surgeon: Ruby Corporal, MD;  [facility];  Service: Endoscopy;  Laterality: N/A;  12:10   ESOPHAGOGASTRODUODENOSCOPY (EGD) WITH PROPOFOL  N/A 10/31/2021   Procedure: ESOPHAGOGASTRODUODENOSCOPY (EGD) WITH PROPOFOL ;  Surgeon: Ruby Corporal, MD;  [facility];  Service: Endoscopy;  Laterality: N/A;  930   ESOPHAGOGASTRODUODENOSCOPY (EGD) WITH PROPOFOL  N/A 09/25/2023   Procedure: ESOPHAGOGASTRODUODENOSCOPY (EGD) WITH PROPOFOL ;  Surgeon: Urban Garden, MD;  [facility]:  [facility];  Service: Gastroenterology;  Laterality: N/A;  10:30AM;ASA 3   FLEXIBLE SIGMOIDOSCOPY  01/17/2012   Procedure: FLEXIBLE SIGMOIDOSCOPY;  Surgeon: Ruby Corporal, MD;  [facility];  Service: Endoscopy;  Laterality: N/A;   GIVENS CAPSULE STUDY N/A 08/03/2019   Procedure: GIVENS CAPSULE STUDY (performed for IDA)->some food debris in stomach and small amount of blood.  Surgeon: Ruby Corporal, MD;  [facility];  Service: Endoscopy;  Laterality: N/A;  730AM   HEMILAMINOTOMY LUMBAR SPINE Bilateral 09/07/1999   L4-5   HOT HEMOSTASIS  09/25/2023   Procedure: HOT HEMOSTASIS (ARGON PLASMA COAGULATION/BICAP);  Surgeon: Umberto Ganong, Bearl Limes, MD;  [facility];  Service: Gastroenterology;;   KNEE ARTHROSCOPY Right 01/1999; 10/2000   LEFT ATRIAL APPENDAGE OCCLUSION N/A 01/23/2023   Procedure: LEFT ATRIAL APPENDAGE OCCLUSION;  Surgeon: Arnoldo Lapping, MD;  [facility];  Service: Cardiovascular;  Laterality: N/A;   LEFT ATRIAL APPENDAGE OCCLUSION     LYSIS OF ADHESION  02/10/2001   MALONEY DILATION  09/27/2019   Procedure: MALONEY DILATION;  Surgeon: Ruby Corporal, MD;  [facility];  Service: Endoscopy;;   NASAL ENDOSCOPY WITH EPISTAXIS CONTROL Bilateral 01/25/2022   Procedure: NASAL ENDOSCOPY WITH EPISTAXIS CONTROL;  Surgeon: Ammon Bales, MD;  Location: [facility];  Service: ENT;  Laterality: Bilateral;   NASAL SEPTOPLASTY W/ TURBINOPLASTY Bilateral 01/25/2022   Procedure: NASAL SEPTOPLASTY WITH TURBINATE REDUCTION;  Surgeon: Ammon Bales, MD;  Location: [facility];  Service: ENT;  Laterality: Bilateral;   RECTOCELE REPAIR  1990; 09/12/2006   TARSAL TUNNEL RELEASE  2002   TEE WITHOUT CARDIOVERSION N/A 01/23/2023   Procedure: TRANSESOPHAGEAL ECHOCARDIOGRAM;  Surgeon: Arnoldo Lapping, MD;  [facility];  Service: Cardiovascular;  Laterality: N/A;   TRANSESOPHAGEAL ECHOCARDIOGRAM (CATH LAB) N/A 08/26/2023    4mm leak around watchman device. Procedure: TRANSESOPHAGEAL ECHOCARDIOGRAM;  Surgeon: Euell Herrlich, MD;  Location: [facility] INVASIVE CV LAB;  Service: Cardiovascular;  Laterality: N/A;   TRANSTHORACIC ECHOCARDIOGRAM  08/04/2015   EF 60-65%, normal wall motion, mild LVH, mild LA dilation, grd I DD.  11/2023 overall poor quality, EF 55-60%   TUMOR EXCISION Left 03/21/2003   dorsal 1st web space (hand)   URETEROLYSIS Right 02/10/2001   Social History   Tobacco Use   Smoking status: Never   Smokeless tobacco: Never  Vaping Use   Vaping status: Never Used  Substance Use Topics   Alcohol use: Not Currently    Comment: ocassionally   Drug use: No      ROS: as noted in HPI  Objective:     BP 116/70   Pulse 76   Wt 218 lb 12.8 oz (99.2 kg)   SpO2 97%   BMI 40.02 kg/m  BP Readings from Last 3 Encounters:  01/23/24 116/70  01/09/24 136/88  11/17/23 106/67   Wt Readings from Last 3 Encounters:  01/23/24 218 lb 12.8 oz (99.2 kg)  01/09/24 227 lb (103 kg)  11/17/23 222 lb (100.7 kg)      Physical Exam Vitals and nursing note reviewed. Exam conducted with a chaperone present.  Constitutional:      General: She is not in acute distress.    Appearance: Normal appearance. She is obese. She is not ill-appearing, toxic-appearing or diaphoretic.  HENT:     Head: Normocephalic and atraumatic.  Eyes:     General: No scleral icterus.       Right eye: No discharge.        Left eye: No discharge.     Extraocular Movements: Extraocular movements intact.     Pupils: Pupils are equal, round, and reactive to light.  Cardiovascular:     Rate and Rhythm: Normal rate and regular rhythm.  Pulmonary:     Effort: Pulmonary effort is normal. No respiratory distress.     Breath sounds: Normal breath sounds. No stridor. No wheezing, rhonchi or rales.  Musculoskeletal:     Cervical back: No tenderness.     Right lower leg: Edema present.     Left lower leg: Edema present.     Comments:  Chronic lymphedema with venous stasis dermatitis  Lymphadenopathy:     Cervical: No cervical adenopathy.  Skin:    General: Skin is warm and dry.  Neurological:     General: No  focal deficit present.     Mental Status: She is alert and oriented to person, place, and time.      No results found for any visits on 01/23/24.  Last CBC Lab Results  Component Value Date   WBC 5.0 11/17/2023   HGB 9.0 (L) 11/17/2023   HCT 29.7 (L) 11/17/2023   MCV 82.6 11/17/2023   MCH 24.3 (L) 11/02/2023   RDW 20.2 (H) 11/17/2023   PLT 152.0 11/17/2023   Last metabolic panel Lab Results  Component Value Date   GLUCOSE 339 (H) 11/17/2023   NA 139 11/17/2023   K 3.6 11/17/2023   CL 100 11/17/2023   CO2 31 11/17/2023   BUN 18 11/17/2023   CREATININE 0.93 11/17/2023   GFR 59.81 (L) 11/17/2023   CALCIUM  8.2 (L) 11/17/2023   PROT 6.1 (L) 11/03/2023   ALBUMIN 2.9 (L) 09/19/2023   LABGLOB 3.4 08/17/2015   AGRATIO 1.0 08/17/2015   BILITOT 1.0 09/19/2023   ALKPHOS 63 09/19/2023   AST 38 09/19/2023   ALT 20 09/19/2023   ANIONGAP 7 11/05/2023   Last lipids Lab Results  Component Value Date   CHOL 149 05/06/2023   HDL 38 (L) 05/06/2023   LDLCALC 89 05/06/2023   LDLDIRECT 69.5 01/24/2011   TRIG 123 05/06/2023   CHOLHDL 3.9 05/06/2023   Last hemoglobin A1c Lab Results  Component Value Date   HGBA1C 7.7 (A) 11/18/2023   HGBA1C 7.7 11/18/2023   HGBA1C 7.7 (A) 11/18/2023   HGBA1C 7.7 (A) 11/18/2023   Last thyroid  functions Lab Results  Component Value Date   TSH 0.618 10/09/2022   Last vitamin D No results found for: "25OHVITD2", "25OHVITD3", "VD25OH" Last vitamin B12 and Folate Lab Results  Component Value Date   VITAMINB12 469 08/22/2023   FOLATE 16.0 08/22/2023      The 10-year ASCVD risk score (Arnett DK, et al., 2019) is: 33.3%  Assessment & Plan:  Type 2 diabetes mellitus with diabetic neuropathy, with long-term current use of insulin  (HCC) -     Horace Lye FlexTouch;  Inject 15 Units into the skin at bedtime.  Dispense: 3 mL; Refill: 5 -     FreeStyle Libre 3 Sensor; 1 each by Does not apply route every 14 (fourteen) days. Please apply for 14 days and then switch to new sensor  Dispense: 2 each; Refill: 3 -     FreeStyle Libre 3 Reader; Use as directed to check blood sugars  Dispense: 1 each; Refill: 11 -     Insulin  Lispro (1 Unit Dial); Inject per sliding scale three times daily, as needed, not to exceed 60 units daily  Dispense: 3 mL; Refill: 5  Type 2 diabetes mellitus with diabetic polyneuropathy, with long-term current use of insulin  (HCC) -     Horace Lye FlexTouch; Inject 15 Units into the skin at bedtime.  Dispense: 3 mL; Refill: 5 -     FreeStyle Libre 3 Sensor; 1 each by Does not apply route every 14 (fourteen) days. Please apply for 14 days and then switch to new sensor  Dispense: 2 each; Refill: 3 -     FreeStyle Libre 3 Reader; Use as directed to check blood sugars  Dispense: 1 each; Refill: 11 -     Insulin  Lispro (1 Unit Dial); Inject per sliding scale three times daily, as needed, not to exceed 60 units daily  Dispense: 3 mL; Refill: 5  Insulin -requiring or dependent type II diabetes mellitus (HCC) -     Horace Lye FlexTouch;  Inject 15 Units into the skin at bedtime.  Dispense: 3 mL; Refill: 5 -     FreeStyle Libre 3 Sensor; 1 each by Does not apply route every 14 (fourteen) days. Please apply for 14 days and then switch to new sensor  Dispense: 2 each; Refill: 3 -     FreeStyle Libre 3 Reader; Use as directed to check blood sugars  Dispense: 1 each; Refill: 11 -     Insulin  Lispro (1 Unit Dial); Inject per sliding scale three times daily, as needed, not to exceed 60 units daily  Dispense: 3 mL; Refill: 5  Assessment and Plan    Type 2 Diabetes Mellitus Type 2 diabetes with frequent, significant nocturnal hypoglycemia. Current regimen includes regular insulin , Farxiga , and Trulicity . A1c is 7.7, target is 6.5. Discussed switching to basal insulin   and Prn sliding scale insulin  for better control and reduced hypoglycemia risk. Horace Lye and insulin  lispro preferred. It appears in the past insurance coverage was cost prohibitive for any insulin  other than regular. - Will start with low dose Tresiba 15 units for basal regimen, with slow up titration every 4-5 days until fasting glucose levels met - Prescribe Freestyle Libre for CGM -dexcom does not appear to be covered - Rapid acting insulin  lispro called in with sliding scale dosing chart. Pt understands more sliding scale will likely be needed at initiation of therapy until we determine basal dose. - Sign up for MyChart.  Hypoglycemia Recurrent nocturnal hypoglycemia likely due to insulin  regimen and meal timing. Discussed regimen and meal adjustments to prevent episodes. - Switch to Tresiba for basal insulin . - Use Freestyle Libre for continuous monitoring. - Educate on meal timing and composition.  Shortness of breath Shortness of breath with improvement. Current treatment with Lasix  and spironolactone . Blood pressure controlled. - Continue Lasix  and spironolactone .         Return in about 3 weeks (around 02/13/2024).   Mandy Second, PA

## 2024-01-23 NOTE — Patient Instructions (Addendum)
   Please start using Tresiba, which is a long acting basal insulin . We will start using 15 units every evening before bed. This is a once daily injection. Please sign up for mychart and send me your glucose readings after 4 days.  Please use the Freestyle Libre glucose system. This will help your glucose readings.  I have also called in a rapid acting insulin . This will be used on a sliding scale, see above chart.  Please schedule a 2-3 week follow up

## 2024-01-26 ENCOUNTER — Encounter: Payer: Self-pay | Admitting: Urgent Care

## 2024-01-26 ENCOUNTER — Encounter (HOSPITAL_COMMUNITY): Payer: Self-pay

## 2024-01-26 ENCOUNTER — Ambulatory Visit (HOSPITAL_COMMUNITY)

## 2024-01-26 DIAGNOSIS — E669 Obesity, unspecified: Secondary | ICD-10-CM | POA: Diagnosis not present

## 2024-01-26 DIAGNOSIS — I890 Lymphedema, not elsewhere classified: Secondary | ICD-10-CM | POA: Diagnosis not present

## 2024-01-26 DIAGNOSIS — I872 Venous insufficiency (chronic) (peripheral): Secondary | ICD-10-CM | POA: Diagnosis not present

## 2024-01-26 NOTE — Therapy (Signed)
 OUTPATIENT PHYSICAL THERAPY LYMPHEDEMA TREATMENT  Patient Name: [name] MRN: [id number] DOB:Apr 28, 1946, 78 y.o., female Today's Date: 01/26/2024  END OF SESSION:  PT End of Session - 01/26/24 1253     Visit Number 3    Number of Visits 18    Date for PT Re-Evaluation 03/02/24    Authorization Type [employer]    PT Start Time 1117    PT Stop Time 1210    PT Time Calculation (min) 53 min    Activity Tolerance Patient tolerated treatment well    Behavior During Therapy [facility] for tasks assessed/performed             Past Medical History:  Diagnosis Date   Carpal tunnel syndrome of right wrist 03/2013   recurrent   Chronic diastolic heart failure (HCC)    Cirrhosis, 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 ? to get liver bx if she gets bariatric surgery? Mild portal hypertensive gastropathy on EGD 08/2019.   Fibromyalgia    GAD (generalized anxiety disorder)    GERD    History of hiatal hernia    History of iron  deficiency anemia 12/2018   Inadequate absorption secondary to chronic/long term PPI therapy + portal hypertensive gastroduodenopathy. No GIB found on EGD, colonosc, and givens. Iron  infusions X multiple.   History of thrombocytopenia 12/2011   Hyperlipidemia    Intolerant of statins   HYPERTENSION    IBS (irritable bowel syndrome)    -D.  Good response to bentyl  and imodium  as of 06/2018 GI f/u.   IDDM (insulin  dependent diabetes mellitus)    with DPN (managed by Dr. Aldona Amel but then in 2018 pt preferred to have me manage for her convenience)   Limited mobility    Requires a walker for arthritic pain, widespread musculoskeletal pain, and neuropathic pain.   Morbid obesity (HCC)    As of 11/2018, pt considering sleeve gastectomy vs bipass as of eval by Dr. Molli Angelucci considering as of 12/2019.   Nonalcoholic steatohepatitis    Viral Hep screens NEG.  CT 2015.  Transaminasemia.  U/S 07/2018 showed early changes of  cirrhosis.   OSA (obstructive sleep apnea) 09/14/2015   sleep study 09/07/15: severe obstructive sleep apnea with an AHI of 72 and SaO2 low of 75%.>referred to sleep MD   Osteoarthritis    hips, shoulders, knees   PAF (paroxysmal atrial fibrillation) (HCC)    One documented episode (after getting EGD 2016).  Was on amiodarone  x 3 mo.  Rate control with metoprolol  + anticoag with xarelto . Watchman 12/2022, off anticoag   Portal hypertensive gastropathy (HCC)    hemorrhagic gastropathy + non-bleeding gastric ulcers on EGD 08/2023   Recurrent epistaxis    Granuloma in L nare cauterized by ENT 04/2020. Another cautery 06/2020   Small fiber neuropathy    Due to DM.  Symmetric hands and feet tingling/numbness.   Ulcerative colitis (HCC)    Remicade  infusion Q 8 weeks: in clinical and endoscopic remission as of 12/2018 GI f/u.  06/11/19 rpt colonoscopy->cecal and ascending colon colitis.   Past Surgical History:  Procedure Laterality Date   ABDOMINAL HYSTERECTOMY  1980   Paps no longer indicated.   BACK SURGERY     BACTERIAL OVERGROWTH TEST N/A 07/13/2015   Procedure: BACTERIAL OVERGROWTH TEST;  Surgeon: Ruby Corporal, MD;  [facility];  Service: Endoscopy;  Laterality: N/A;  730     BILATERAL SALPINGOOPHORECTOMY  02/10/2001   BIOPSY  06/11/2019  Procedure: BIOPSY;  Surgeon: Ruby Corporal, MD;  [facility];  Service: Endoscopy;;  colon   BIOPSY  09/27/2019   Procedure: BIOPSY;  Surgeon: Ruby Corporal, MD;  [facility];  Service: Endoscopy;;  gastric duodenal   BIOPSY  09/15/2020   Procedure: BIOPSY;  Surgeon: Urban Garden, MD;  [facility];  Service: Gastroenterology;;   BIOPSY  09/25/2023   Procedure: BIOPSY;  Surgeon: Urban Garden, MD;  [facility];  Service: Gastroenterology;;   BREAST REDUCTION SURGERY  1994   bilat   BREAST SURGERY     CARDIOVASCULAR STRESS TEST  07/2010   Lexiscan myoview:  normal   CARPAL TUNNEL RELEASE Right 1996   CARPAL TUNNEL RELEASE Left 03/21/2003   CARPAL TUNNEL RELEASE Right 05/04/2013   Procedure: CARPAL TUNNEL RELEASE;  Surgeon: Amelie Baize., MD;  Location: [facility];  Service: Orthopedics;  Laterality: Right;   CARPAL TUNNEL RELEASE Left 09/21/2013   Procedure: LEFT CARPAL TUNNEL RELEASE;  Surgeon: Amelie Baize., MD;  Location: [facility];  Service: Orthopedics;  Laterality: Left;   CHOLECYSTECTOMY     COLONOSCOPY WITH PROPOFOL  N/A 08/04/2015   Colitis in remission.  No polyps.  Procedure: COLONOSCOPY WITH PROPOFOL ;  Surgeon: Ruby Corporal, MD;  Location: [facility];  Service: Endoscopy;  Laterality: N/A;  cecum time in  0820   time out  0827    total time 7 minutes   COLONOSCOPY WITH PROPOFOL  N/A 06/11/2019   cecal and ascending colon colitis.  Procedure: COLONOSCOPY WITH PROPOFOL ;  Surgeon: Ruby Corporal, MD;  [facility];  Service: Endoscopy;  Laterality: N/A;  730a   COLONOSCOPY WITH PROPOFOL  N/A 09/15/2020   Procedure: COLONOSCOPY WITH PROPOFOL ;  Surgeon: Urban Garden, MD;  [facility];  Service: Gastroenterology;  Laterality: N/A;  1030   ESOPHAGEAL DILATION N/A 08/04/2015   Procedure: ESOPHAGEAL DILATION;  Surgeon: Ruby Corporal, MD;  Location: [facility];  Service: Endoscopy;  Laterality: N/AFelicita Horns, no mucousal disruption   ESOPHAGOGASTRODUODENOSCOPY  09/27/2019   Performed for IDA.  Esoph dilation was done but no stricture present.  Mild portal hypertensive gastropathy, o/w normal.  Duodenal bx NEG.  h pylori neg.   ESOPHAGOGASTRODUODENOSCOPY     09/25/2023 inactive chronic gastritis, h pylor neg. benign   ESOPHAGOGASTRODUODENOSCOPY     09/25/23 hemorrhagic portal gastropathy + nonbleeding gastric ulcers (H pylori neg)   ESOPHAGOGASTRODUODENOSCOPY (EGD) WITH ESOPHAGEAL DILATION  12/02/2005   ESOPHAGOGASTRODUODENOSCOPY (EGD) WITH PROPOFOL  N/A 08/04/2015    Procedure: ESOPHAGOGASTRODUODENOSCOPY (EGD) WITH PROPOFOL ;  Surgeon: Ruby Corporal, MD;  [facility]: [facility];  Service: Endoscopy;  Laterality: N/A;  procedure 1   ESOPHAGOGASTRODUODENOSCOPY (EGD) WITH PROPOFOL  N/A 09/27/2019   Procedure: ESOPHAGOGASTRODUODENOSCOPY (EGD) WITH PROPOFOL ;  Surgeon: Ruby Corporal, MD;  [facility];  Service: Endoscopy;  Laterality: N/A;  12:10   ESOPHAGOGASTRODUODENOSCOPY (EGD) WITH PROPOFOL  N/A 10/31/2021   Procedure: ESOPHAGOGASTRODUODENOSCOPY (EGD) WITH PROPOFOL ;  Surgeon: Ruby Corporal, MD;  [facility];  Service: Endoscopy;  Laterality: N/A;  930   ESOPHAGOGASTRODUODENOSCOPY (EGD) WITH PROPOFOL  N/A 09/25/2023   Procedure: ESOPHAGOGASTRODUODENOSCOPY (EGD) WITH PROPOFOL ;  Surgeon: Urban Garden, MD;  [facility];  Service: Gastroenterology;  Laterality: N/A;  10:30AM;ASA 3   FLEXIBLE SIGMOIDOSCOPY  01/17/2012   Procedure: FLEXIBLE SIGMOIDOSCOPY;  Surgeon: Ruby Corporal, MD;  [facility];  Service: Endoscopy;  Laterality: N/A;   GIVENS CAPSULE STUDY N/A 08/03/2019   Procedure: GIVENS CAPSULE STUDY (performed for IDA)->some food debris in stomach and small amount of blood.  Surgeon: Ruby Corporal, MD;  [facility];  Service: Endoscopy;  Laterality: N/A;  730AM   HEMILAMINOTOMY LUMBAR SPINE Bilateral 09/07/1999   L4-5   HOT HEMOSTASIS  09/25/2023   Procedure: HOT HEMOSTASIS (ARGON PLASMA COAGULATION/BICAP);  Surgeon: Umberto Ganong, Bearl Limes, MD;  [facility];  Service: Gastroenterology;;   KNEE ARTHROSCOPY Right 01/1999; 10/2000   LEFT ATRIAL APPENDAGE OCCLUSION N/A 01/23/2023   Procedure: LEFT ATRIAL APPENDAGE OCCLUSION;  Surgeon: Arnoldo Lapping, MD;  Location: [facility] INVASIVE CV LAB;  Service: Cardiovascular;  Laterality: N/A;   LEFT ATRIAL APPENDAGE OCCLUSION     LYSIS OF ADHESION  02/10/2001   MALONEY DILATION  09/27/2019   Procedure: MALONEY DILATION;  Surgeon: Ruby Corporal, MD;  [facility];  Service: Endoscopy;;   NASAL ENDOSCOPY WITH EPISTAXIS CONTROL Bilateral 01/25/2022   Procedure: NASAL ENDOSCOPY WITH EPISTAXIS CONTROL;  Surgeon: Ammon Bales, MD;  Location: [facility];  Service: ENT;  Laterality: Bilateral;   NASAL SEPTOPLASTY W/ TURBINOPLASTY Bilateral 01/25/2022   Procedure: NASAL SEPTOPLASTY WITH TURBINATE REDUCTION;  Surgeon: Ammon Bales, MD;  Location: [facility] OR;  Service: ENT;  Laterality: Bilateral;   RECTOCELE REPAIR  1990; 09/12/2006   TARSAL TUNNEL RELEASE  2002   TEE WITHOUT CARDIOVERSION N/A 01/23/2023   Procedure: TRANSESOPHAGEAL ECHOCARDIOGRAM;  Surgeon: Arnoldo Lapping, MD;  Location: [facility] INVASIVE CV LAB;  Service: Cardiovascular;  Laterality: N/A;   TRANSESOPHAGEAL ECHOCARDIOGRAM (CATH LAB) N/A 08/26/2023   4mm leak around watchman device. Procedure: TRANSESOPHAGEAL ECHOCARDIOGRAM;  Surgeon: Euell Herrlich, MD;  Location: [facility] INVASIVE CV LAB;  Service: Cardiovascular;  Laterality: N/A;   TRANSTHORACIC ECHOCARDIOGRAM  08/04/2015   EF 60-65%, normal wall motion, mild LVH, mild LA dilation, grd I DD.  11/2023 overall poor quality, EF 55-60%   TUMOR EXCISION Left 03/21/2003   dorsal 1st web space (hand)   URETEROLYSIS Right 02/10/2001   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   Fecal urgency 08/02/2020   Deviated septum 07/28/2020   Epistaxis, recurrent 05/15/2020    Ulcerative colitis without complications (HCC) 08/24/2019   Gastrointestinal hemorrhage 08/24/2019   Iron  deficiency anemia due to chronic blood loss 07/28/2019   Iron  deficiency anemia 05/20/2019   Heme positive stool 05/20/2019   History of iron  deficiency anemia 12/2018   Vertigo 12/04/2015   Uncontrolled type 2 diabetes mellitus with peripheral neuropathy 11/14/2015   OSA (obstructive sleep apnea), severe 09/14/2015   B12 deficiency 08/17/2015   Vitamin B6 deficiency 08/17/2015   PAF (paroxysmal atrial fibrillation) (HCC) 08/17/2015   Chronic anticoagulation 08/17/2015   Atrial fibrillation with RVR (HCC) 08/04/2015   Dysphagia    Gastroesophageal reflux disease without esophagitis    Lymphocytosis 07/04/2015   Obesity 05/18/2014   Allergic sinusitis 12/09/2013   Myalgia 04/28/2012   Polyarthralgia 04/28/2012   Elevated LFTs  01/16/2012   Insomnia 01/12/2012   Small fiber neuropathy 05/30/2011   Anxiety 05/30/2011   Dyslipidemia 01/24/2011   PALPITATIONS, RECURRENT 08/08/2010   Essential hypertension 04/12/2010   GERD 04/12/2010   Ulcerative colitis (HCC) 04/12/2010    PCP: Mandy Second, PA  REFERRING PROVIDER: Mandy Second, PA  REFERRING DIAG: I87.2 (ICD-10-CM) - Venous insufficiency of both lower extremities I89.0,E66.9 (ICD-10-CM) - Lymphedema associated with obesity I87.2 (ICD-10-CM) - Venous stasis dermatitis of both lower extremities  THERAPY DIAG:  Lymphedema, not elsewhere classified  Rationale for Evaluation and Treatment: Rehabilitation  ONSET DATE: chronic but has increased significantly in the past year.   SUBJECTIVE:                                                                                                                                                                                           SUBJECTIVE STATEMENT: 01/26/24:  Pt arrived with spouse, they brought in wraps.  Has not began the exercises or self manual yet.    Eval:  She  notes swelling in her legs, particularly in the feet, which has worsened since her discharge from the hospital in January. She is currently taking furosemide  60 mg daily, taking three 20mg  tabs in the morning and three in the evening, but has not noticed significant improvement in the swelling.   Pt states that her mother had large legs.  She has been elevating her legs and completing ankle pumps since the beginning of February without improvement.  She has tried compression but her legs are to big for the garments at this time. Pt states that her edema is from the knees down.  Pt states that she is now in a size up from her normal shoe size and has to take the shoe laces out to fit in them.      PERTINENT HISTORY:  KALIYAN HOPPE is a 78 y.o. female with past medical history significant for chronic diastolic congestive heart failure, proximal atrial fibrillation not on anticoagulation due to recurrent epistaxis s/p Watchman procedure (12/2022), CAD on aspirin , type 2 diabetes mellitus, anxiety/depression, nonalcoholic cirrhosis, OSA on CPAP, morbid obesity hospitalized for acute CHF from 10/31/23-11/04/2574 .     PAIN:  Are you having pain?   NPRS scale: 0/10  PRECAUTIONS: Other: cellulitis    RED FLAGS: None   WEIGHT BEARING RESTRICTIONS: No  FALLS:  Has patient fallen in last 6 months? No  LIVING ENVIRONMENT: Lives with: lives with their family Lives in: House/apartment Stairs: Yes: Internal: 2 steps; none and External: 2 steps; none Has following equipment at home: Quad cane small base  PATIENT GOALS:  less swelling    OBJECTIVE: Note: Objective measures were completed at Evaluation unless otherwise noted.  COGNITION: Overall cognitive status: Within functional limits for tasks assessed   PALPATION: Increased induration greater in LE but noted in thigh area as well   OBSERVATIONS / OTHER ASSESSMENTS: slight redness and thickening of skin   POSTURE: forward head, rounded shoulders  and increased kyphosis  All within normal limits:   LYMPHEDEMA ASSESSMENTS:    LE LANDMARK RIGHT eval  At groin   30 cm proximal to suprapatella   20 cm proximal to suprapatella   10 cm proximal to suprapatella   At midpatella / popliteal crease 52.5  30 cm proximal to floor at lateral plantar foot 53.5  20 cm proximal to floor at lateral plantar foot 44   10 cm proximal to floor at lateral plantar foot 29  Circumference of ankle/heel 33.5  5 cm proximal to 1st MTP joint 24  Across MTP joint 23  Around proximal great toe   (Blank rows = not tested)  LE LANDMARK LEFT eval  At groin   30 cm proximal to suprapatella   20 cm proximal to suprapatella   10 cm proximal to suprapatella   At midpatella / popliteal crease 53  30 cm proximal to floor at lateral plantar foot 54.2  20 cm proximal to floor at lateral plantar foot 40.5  10 cm proximal to floor at lateral plantar foot 32.3  Circumference of ankle/heel 35.4  5 cm proximal to 1st MTP joint 24.3  Across MTP joint 23.3  Around proximal great toe   (Blank rows = not tested)  FUNCTIONAL TESTS:  Gait : 1:30 minutes to ambulate with quad cane x 80 ft    TODAY'S TREATMENT:                                                                                                                              DATE:  01/26/24: Reviewed current HEP with handout given and educated importance of daily completion Educated self manual with multimodal cueing for proper pressure, circle and pump techniques and correct direction Manual decongestive massage for inguinal to axillary anastomosis including short neck, superficial then deep abdominal, axillary, BLE anterior   01/22/24:  Educated 4 components of lymphedema treatment Cut foam (under cabinet) Reviewed HEP including: ankle pumps, LAQ, hip ab/adduction, marching and diaphragm breath Manual decongestive massage for inguinal to axillary anastomosis including short neck, superficial then  deep abdominal, axillary, BLE anterior supine and posterior in sidelying Reviewed self manual and given handout   01/20/2024 : what lymphedema is and how it is controled, LE exercises, what bandages to order.    PATIENT EDUCATION:  Education details: skin care recommending using Eucerin or Aquaphor, exercise to include ankle pumps, LAQ, hip ab/adduction, marching and diaphragm breath, what manual will be like as well as multilayer compression dressing.  Person educated: Patient and Spouse Education method: Explanation, Verbal cues, and Handouts Education  comprehension: verbalized understanding and returned demonstration  HOME EXERCISE PROGRAM: ankle pumps, LAQ, hip ab/adduction, marching and diaphragm breath  ASSESSMENT:  CLINICAL IMPRESSION: 01/26/24:  Pt arrived with husband with new bandages.  Reviewed importance of exercises daily and educated self manual with multimodal cueing for proper pressure, direction and circle/pump techniques.  Applied good layer of lotion prior application of short stretch bandages with 1/2in foam to Bil knees.  Reports of comfort.  Reviewed days to wear and encouraged to remove and re-roll prior next session.  Discussed schedule and encouraged to come 3days a week for maximal benefits with lymphedema care.    Eval:  Patient is a 78 y.o. female who was seen today for physical therapy evaluation and treatment for B LE lymphedema.  Evaluation demonstrates increased swelling of B LE with skin thickening and induration.  She has some redness along the anterior aspect of both LE.  Therapist explained that there is induration in pt thigh area as well and this area would benefit from decongestive techniques as well.  Ms Debeer will decide if she is interested in compression bandaging to the thigh level or if she will wear capri compression.  Ms. Holifield has recently been hospitalized for acute CHF therefore the therapist messaged  her cardiologist  to see if she is able to go  thru total decongestive techniques. (Amy Micael Adas was CC on this due to therapist being out of town next week.   OBJECTIVE IMPAIRMENTS: cardiopulmonary status limiting activity, decreased activity tolerance, decreased mobility, difficulty walking, decreased ROM, decreased strength, increased edema, and obesity.   ACTIVITY LIMITATIONS: carrying, lifting, stairs, bathing, dressing, and locomotion level  PARTICIPATION LIMITATIONS: shopping and community activity  PERSONAL FACTORS: Age, Fitness, Time since onset of injury/illness/exacerbation, and 1-2 comorbidities: CHF,   are also affecting patient's functional outcome.   REHAB POTENTIAL: Good  CLINICAL DECISION MAKING: Evolving/moderate complexity  EVALUATION COMPLEXITY: Moderate  GOALS: Goals reviewed with patient? Yes  SHORT TERM GOALS: Target date: 02/10/24  PT to be I in HEP to improve lymphatic circulation Baseline: Goal status: INITIAL  2.  PT to have lost 1-2 cm in her feet to be able to fit in her shoes, 2-3 cm in legs for improved skin integrity. Baseline:  Goal status: INITIAL   LONG TERM GOALS: Target date: 03/02/24  PT to have lost 2 -3 cm in her feet, 3-5 cm in her LE for decreased risk of cellulitis.  Baseline:  Goal status: INITIAL  2.  Pt to have aqcuired juxtafit compression garment and be able to don to be able to continue maintenance phase of treatment.  Baseline:  Goal status: INITIAL  3.  PT to be I in self manual to assist in increasing lymphatic circulation Baseline:  Goal status: INITIAL  PLAN:  PT FREQUENCY: 3x/week  PT DURATION: 6 weeks  PLANNED INTERVENTIONS: 97110-Therapeutic exercises, 97535- Self Care, and 40981- Manual therapy  PLAN FOR NEXT SESSION:  begin manual and teaching self manual techniques.   Minor Amble, LPTA/CLT; Johnye Napoleon [phone]  01/26/2024, 2:11 PM

## 2024-01-26 NOTE — Progress Notes (Incomplete)
 Established Patient Office Visit  Subjective:  Patient ID: [name], female    DOB: 1946/01/23  Age: 78 y.o. MRN: [id number]  Chief Complaint  Patient presents with  . Follow-up    Follow up from last visit.    HPI  Discussed the use of AI scribe software for clinical note transcription with the patient, who gave verbal consent to proceed.  History of Present Illness   [name] is a 78 year old female with diabetes who presents with concerns about hypoglycemia.  She experiences episodes of hypoglycemia, primarily occurring at night, with blood sugar levels dropping into the low fourties to fifties. A significant episode occurred where it fell into the thirties, causing her to wake up disoriented. These episodes often occur when she stays up late and are sometimes preceded by her last meal at 5 or 6 PM, which includes protein. She takes insulin  three times a day, with 100 units at breakfast, lunch, and dinner, in addition to 5 mg of Farxiga  and 1.5 mg of Trulicity . No hypoglycemic episodes after breakfast or lunch. She reports consistent meal sizes throughout the day.  She has been experiencing shortness of breath, for which spironolactone  was added to her medication regimen. She continues to take Lasix , and her blood pressure is reported to be well-controlled. Significant improvement in her symptoms has been noted since the last visit. Additionally she is now attending the lymphedema clinic and has had several massages.  Her last A1c was 7.7, and she aims to reduce it to 6.5. She identifies fruit as a potential trigger for her blood sugar fluctuations, describing it as her 'comfort food'. She has a Dexcom G7 system for continuous glucose monitoring, which she has had for several months, however has yet to use it and continues using fingersticks.      Patient Active Problem List   Diagnosis Date Noted  . Acute on chronic diastolic CHF (congestive heart failure) (HCC) 10/31/2023   . 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 08/26/2023  . Atrial fibrillation (HCC) 01/23/2023  . Gastroenteritis, infectious, presumed 11/14/2022  . Infectious colitis 11/14/2022  . Acute metabolic encephalopathy 10/09/2022  . Back pain 10/09/2022  . Diabetes mellitus (HCC) 10/09/2022  . Hypoglycemia 10/09/2022  . IBS (irritable bowel syndrome)   . Chronic ulcerative pancolitis (HCC) 08/21/2021  . Diarrhea 10/03/2020  . 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 Fecal urgency 08/02/2020  . Deviated septum 07/28/2020  . Epistaxis, recurrent 05/15/2020  . Ulcerative colitis without complications (HCC) 08/24/2019  . Gastrointestinal hemorrhage 08/24/2019  . Iron  deficiency anemia due to chronic blood loss 07/28/2019  . Iron  deficiency anemia 05/20/2019  . Heme positive stool 05/20/2019  . History of iron  deficiency anemia 12/2018  . Vertigo 12/04/2015  . Uncontrolled type 2 diabetes mellitus with peripheral neuropathy 11/14/2015  . OSA (obstructive sleep apnea), severe 09/14/2015  . B12 deficiency 08/17/2015  . Vitamin B6 deficiency 08/17/2015  . PAF (paroxysmal atrial fibrillation) (HCC) 08/17/2015  . Chronic anticoagulation 08/17/2015  . Atrial fibrillation with RVR (HCC) 08/04/2015  . Dysphagia   . Gastroesophageal reflux disease without esophagitis   . Lymphocytosis 07/04/2015  . Obesity 05/18/2014  . Allergic sinusitis 12/09/2013  . Myalgia 04/28/2012  . Polyarthralgia 04/28/2012  . Elevated LFTs 01/16/2012  . Insomnia 01/12/2012  . Small fiber neuropathy 05/30/2011  . Anxiety 05/30/2011  . Dyslipidemia 01/24/2011  . PALPITATIONS, RECURRENT 08/08/2010  . Essential hypertension 04/12/2010  . GERD 04/12/2010  . Ulcerative colitis (HCC)  04/12/2010   Past Medical History:  Diagnosis Date  . 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 Chronic diastolic heart failure (HCC)   . 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 ? to get liver bx if she gets bariatric surgery? Mild portal hypertensive gastropathy on EGD 08/2019.  Aaron Aas Fibromyalgia   . GAD (generalized anxiety disorder)   . GERD   . History of hiatal hernia   . History of iron  deficiency anemia 12/2018   Inadequate absorption secondary to chronic/long term PPI therapy + portal hypertensive gastroduodenopathy. No GIB found on EGD, colonosc, and givens. Iron  infusions X multiple.  Aaron Aas History of thrombocytopenia 12/2011  . Hyperlipidemia    Intolerant of statins  . HYPERTENSION   . IBS (irritable bowel syndrome)    -D.  Good response to bentyl  and imodium  as of 06/2018 GI f/u.  Aaron Aas IDDM (insulin  dependent diabetes mellitus)    with DPN (managed by Dr. Aldona Amel but then in 2018 pt preferred to have me manage for her convenience)  . Limited mobility    Requires a walker for arthritic pain, widespread musculoskeletal pain, and neuropathic pain.  . Morbid obesity (HCC)    As of 11/2018, pt considering sleeve gastectomy vs bipass as of eval by Dr. Molli Angelucci considering as of 12/2019.  Aaron Aas Nonalcoholic steatohepatitis    Viral Hep screens NEG.  CT 2015.  Transaminasemia.  U/S 07/2018 showed early changes of cirrhosis.  . OSA (obstructive sleep apnea) 09/14/2015   sleep study 09/07/15: severe obstructive sleep apnea with an AHI of 72 and SaO2 low of 75%.>referred to sleep MD  . Osteoarthritis    hips, shoulders, knees  . PAF (paroxysmal atrial fibrillation) (HCC)    One documented episode (after getting EGD 2016).  Was on amiodarone  x 3 mo.  Rate control with metoprolol  + anticoag with xarelto . Watchman 12/2022, off anticoag  . Portal hypertensive gastropathy (HCC)    hemorrhagic gastropathy + non-bleeding gastric ulcers on EGD 08/2023  . Recurrent epistaxis    Granuloma in L nare cauterized by ENT 04/2020. Another cautery 06/2020  . Small fiber neuropathy    Due to DM.  Symmetric hands and feet tingling/numbness.  Aaron Aas  Ulcerative colitis (HCC)    Remicade  infusion Q 8 weeks: in clinical and endoscopic remission as of 12/2018 GI f/u.  06/11/19 rpt colonoscopy->cecal and ascending colon colitis.   Past Surgical History:  Procedure Laterality Date  . ABDOMINAL HYSTERECTOMY  1980   Paps no longer indicated.  Aaron Aas BACK SURGERY    . BACTERIAL OVERGROWTH TEST N/A 07/13/2015   Procedure: BACTERIAL OVERGROWTH TEST;  Surgeon: Ruby Corporal, MD;  [facility]: [facility];  Service: Endoscopy;  Laterality: N/A;  730    . BILATERAL SALPINGOOPHORECTOMY  02/10/2001  . BIOPSY  06/11/2019   Procedure: BIOPSY;  Surgeon: Ruby Corporal, MD;  [facility]: [facility];  Service: Endoscopy;;  colon  . BIOPSY  09/27/2019   Procedure: BIOPSY;  Surgeon: Ruby Corporal, MD;  [facility]: [facility];  Service: Endoscopy;;  gastric duodenal  . BIOPSY  09/15/2020   Procedure: BIOPSY;  Surgeon: Urban Garden, MD;  [facility];  Service: Gastroenterology;;  . BIOPSY  09/25/2023   Procedure: BIOPSY;  Surgeon: Urban Garden, MD;  [facility]: [facility];  Service: Gastroenterology;;  . BREAST REDUCTION SURGERY  1994   bilat  . BREAST SURGERY    .  CARDIOVASCULAR STRESS TEST  07/2010   Lexiscan myoview: normal  . CARPAL TUNNEL RELEASE Right 1996  . CARPAL TUNNEL RELEASE Left 03/21/2003  . CARPAL TUNNEL RELEASE Right 05/04/2013   Procedure: CARPAL TUNNEL RELEASE;  Surgeon: Amelie Baize., MD;  Location: [facility];  Service: Orthopedics;  Laterality: Right;  . CARPAL TUNNEL RELEASE Left 09/21/2013   Procedure: LEFT CARPAL TUNNEL RELEASE;  Surgeon: Amelie Baize., MD;  Location: [facility];  Service: Orthopedics;  Laterality: Left;  . CHOLECYSTECTOMY    . COLONOSCOPY WITH PROPOFOL  N/A 08/04/2015   Colitis in remission.  No polyps.  Procedure: COLONOSCOPY WITH PROPOFOL ;  Surgeon: Ruby Corporal, MD;  Location: [facility];  Service: Endoscopy;  Laterality: N/A;   cecum time in  0820   time out  0827    total time 7 minutes  . COLONOSCOPY WITH PROPOFOL  N/A 06/11/2019   cecal and ascending colon colitis.  Procedure: COLONOSCOPY WITH PROPOFOL ;  Surgeon: Ruby Corporal, MD;  [facility];  Service: Endoscopy;  Laterality: N/A;  730a  . COLONOSCOPY WITH PROPOFOL  N/A 09/15/2020   Procedure: COLONOSCOPY WITH PROPOFOL ;  Surgeon: Urban Garden, MD;  [facility];  Service: Gastroenterology;  Laterality: N/A;  1030  . ESOPHAGEAL DILATION N/A 08/04/2015   Procedure: ESOPHAGEAL DILATION;  Surgeon: Ruby Corporal, MD;  Location: [facility];  Service: Endoscopy;  Laterality: N/A;  [place] 56, no mucousal disruption  . ESOPHAGOGASTRODUODENOSCOPY  09/27/2019   Performed for IDA.  Esoph dilation was done but no stricture present.  Mild portal hypertensive gastropathy, o/w normal.  Duodenal bx NEG.  h pylori neg.  Aaron Aas ESOPHAGOGASTRODUODENOSCOPY     09/25/2023 inactive chronic gastritis, h pylor neg. benign  . ESOPHAGOGASTRODUODENOSCOPY     09/25/23 hemorrhagic portal gastropathy + nonbleeding gastric ulcers (H pylori neg)  . ESOPHAGOGASTRODUODENOSCOPY (EGD) WITH ESOPHAGEAL DILATION  12/02/2005  . ESOPHAGOGASTRODUODENOSCOPY (EGD) WITH PROPOFOL  N/A 08/04/2015   Procedure: ESOPHAGOGASTRODUODENOSCOPY (EGD) WITH PROPOFOL ;  Surgeon: Ruby Corporal, MD;  [facility]: [facility];  Service: Endoscopy;  Laterality: N/A;  procedure 1  . ESOPHAGOGASTRODUODENOSCOPY (EGD) WITH PROPOFOL  N/A 09/27/2019   Procedure: ESOPHAGOGASTRODUODENOSCOPY (EGD) WITH PROPOFOL ;  Surgeon: Ruby Corporal, MD;  [facility];  Service: Endoscopy;  Laterality: N/A;  12:10  . ESOPHAGOGASTRODUODENOSCOPY (EGD) WITH PROPOFOL  N/A 10/31/2021   Procedure: ESOPHAGOGASTRODUODENOSCOPY (EGD) WITH PROPOFOL ;  Surgeon: Ruby Corporal, MD;  [facility];  Service: Endoscopy;  Laterality: N/A;  930  . ESOPHAGOGASTRODUODENOSCOPY (EGD) WITH PROPOFOL  N/A 09/25/2023    Procedure: ESOPHAGOGASTRODUODENOSCOPY (EGD) WITH PROPOFOL ;  Surgeon: Urban Garden, MD;  [facility];  Service: Gastroenterology;  Laterality: N/A;  10:30AM;ASA 3  . FLEXIBLE SIGMOIDOSCOPY  01/17/2012   Procedure: FLEXIBLE SIGMOIDOSCOPY;  Surgeon: Ruby Corporal, MD;  [facility];  Service: Endoscopy;  Laterality: N/A;  . GIVENS CAPSULE STUDY N/A 08/03/2019   Procedure: GIVENS CAPSULE STUDY (performed for IDA)->some food debris in stomach and small amount of blood.  Surgeon: Ruby Corporal, MD;  [facility];  Service: Endoscopy;  Laterality: N/A;  730AM  . HEMILAMINOTOMY LUMBAR SPINE Bilateral 09/07/1999   L4-5  . HOT HEMOSTASIS  09/25/2023   Procedure: HOT HEMOSTASIS (ARGON PLASMA COAGULATION/BICAP);  Surgeon: Umberto Ganong, Bearl Limes, MD;  [facility];  Service: Gastroenterology;;  . KNEE ARTHROSCOPY Right 01/1999; 10/2000  . LEFT ATRIAL APPENDAGE OCCLUSION N/A 01/23/2023   Procedure: LEFT ATRIAL APPENDAGE OCCLUSION;  Surgeon: Arnoldo Lapping, MD;  Location: [facility];  Service: Cardiovascular;  Laterality: N/A;  . LEFT ATRIAL APPENDAGE OCCLUSION    . LYSIS OF ADHESION  02/10/2001  . MALONEY DILATION  09/27/2019   Procedure: MALONEY DILATION;  Surgeon: Ruby Corporal, MD;  [facility];  Service: Endoscopy;;  . NASAL ENDOSCOPY WITH EPISTAXIS CONTROL Bilateral 01/25/2022   Procedure: NASAL ENDOSCOPY WITH EPISTAXIS CONTROL;  Surgeon: Ammon Bales, MD;  Location: [facility];  Service: ENT;  Laterality: Bilateral;  . NASAL SEPTOPLASTY W/ TURBINOPLASTY Bilateral 01/25/2022   Procedure: NASAL SEPTOPLASTY WITH TURBINATE REDUCTION;  Surgeon: Ammon Bales, MD;  Location: [facility];  Service: ENT;  Laterality: Bilateral;  . RECTOCELE REPAIR  1990; 09/12/2006  . TARSAL TUNNEL RELEASE  2002  . TEE WITHOUT CARDIOVERSION N/A 01/23/2023   Procedure: TRANSESOPHAGEAL ECHOCARDIOGRAM;  Surgeon: Arnoldo Lapping, MD;  Location: [facility]  INVAS[facility] CV LAB;  Service: Cardiovascular;  Laterality: N/A;  . TRANSESOPHAGEAL ECHOCARDIOGRAM (CATH LAB) N/A 08/26/2023   4mm leak around watchman device. Procedure: TRANSESOPHAGEAL ECHOCARDIOGRAM;  Surgeon: Euell Herrlich, MD;  Location: [facility]VASIVE CV LAB;  Service: Cardiovascular;  Laterality: N/A;  . TRANSTHORACIC ECHOCARDIOGRAM  08/04/2015   EF 60-65%, normal wall motion, mild LVH, mild LA dilation, grd I DD.  11/2023 overall poor quality, EF 55-60%  . TUMOR EXCISION Left 03/21/2003   dorsal 1st web space (hand)  . URETEROLYSIS Right 02/10/2001   Social History   Tobacco Use  . Smoking status: Never  . Smokeless tobacco: Never  Vaping Use  . Vaping status: Never Used  Substance Use Topics  . Alcohol use: Not Currently    Comment: ocassionally  . Drug use: No      ROS: as noted in HPI  Objective:     BP 116/70   Pulse 76   Wt 218 lb 12.8 oz (99.2 kg)   SpO2 97%   BMI 40.02 kg/m  BP Readings from Last 3 Encounters:  01/23/24 116/70  01/09/24 136/88  11/17/23 106/67   Wt Readings from Last 3 Encounters:  01/23/24 218 lb 12.8 oz (99.2 kg)  01/09/24 227 lb (103 kg)  11/17/23 222 lb (100.7 kg)      Physical Exam Vitals and nursing note reviewed. Exam conducted with a chaperone present.  Constitutional:      General: She is not in acute distress.    Appearance: Normal appearance. She is obese. She is not ill-appearing, toxic-appearing or diaphoretic.  HENT:     Head: Normocephalic and atraumatic.  Eyes:     General: No scleral icterus.       Right eye: No discharge.        Left eye: No discharge.     Extraocular Movements: Extraocular movements intact.     Pupils: Pupils are equal, round, and reactive to light.  Cardiovascular:     Rate and Rhythm: Normal rate and regular rhythm.  Pulmonary:     Effort: Pulmonary effort is normal. No respiratory distress.     Breath sounds: Normal breath sounds. No stridor. No wheezing, rhonchi or rales.   Musculoskeletal:     Cervical back: No tenderness.     Right lower leg: Edema present.     Left lower leg: Edema present.     Comments: Chronic lymphedema with venous stasis dermatitis  Lymphadenopathy:     Cervical: No cervical adenopathy.  Skin:    General: Skin is warm and dry.  Neurological:     General: No  focal deficit present.     Mental Status: She is alert and oriented to person, place, and time.      No results found for any visits on 01/23/24.  Last CBC Lab Results  Component Value Date   WBC 5.0 11/17/2023   HGB 9.0 (L) 11/17/2023   HCT 29.7 (L) 11/17/2023   MCV 82.6 11/17/2023   MCH 24.3 (L) 11/02/2023   RDW 20.2 (H) 11/17/2023   PLT 152.0 11/17/2023   Last metabolic panel Lab Results  Component Value Date   GLUCOSE 339 (H) 11/17/2023   NA 139 11/17/2023   K 3.6 11/17/2023   CL 100 11/17/2023   CO2 31 11/17/2023   BUN 18 11/17/2023   CREATININE 0.93 11/17/2023   GFR 59.81 (L) 11/17/2023   CALCIUM  8.2 (L) 11/17/2023   PROT 6.1 (L) 11/03/2023   ALBUMIN 2.9 (L) 09/19/2023   LABGLOB 3.4 08/17/2015   AGRATIO 1.0 08/17/2015   BILITOT 1.0 09/19/2023   ALKPHOS 63 09/19/2023   AST 38 09/19/2023   ALT 20 09/19/2023   ANIONGAP 7 11/05/2023   Last lipids Lab Results  Component Value Date   CHOL 149 05/06/2023   HDL 38 (L) 05/06/2023   LDLCALC 89 05/06/2023   LDLDIRECT 69.5 01/24/2011   TRIG 123 05/06/2023   CHOLHDL 3.9 05/06/2023   Last hemoglobin A1c Lab Results  Component Value Date   HGBA1C 7.7 (A) 11/18/2023   HGBA1C 7.7 11/18/2023   HGBA1C 7.7 (A) 11/18/2023   HGBA1C 7.7 (A) 11/18/2023   Last thyroid  functions Lab Results  Component Value Date   TSH 0.618 10/09/2022   Last vitamin D No results found for: "25OHVITD2", "25OHVITD3", "VD25OH" Last vitamin B12 and Folate Lab Results  Component Value Date   VITAMINB12 469 08/22/2023   FOLATE 16.0 08/22/2023      The 10-year ASCVD risk score (Arnett DK, et al., 2019) is:  33.3%  Assessment & Plan:  Type 2 diabetes mellitus with diabetic neuropathy, with long-term current use of insulin  (HCC) -     [name] FlexTouch; Inject 15 Units into the skin at bedtime.  Dispense: 3 mL; Refill: 5 -     FreeStyle Libre 3 Sensor; 1 each by Does not apply route every 14 (fourteen) days. Please apply for 14 days and then switch to new sensor  Dispense: 2 each; Refill: 3 -     FreeStyle Libre 3 Reader; Use as directed to check blood sugars  Dispense: 1 each; Refill: 11 -     Insulin  Lispro (1 Unit Dial); Inject per sliding scale three times daily, as needed, not to exceed 60 units daily  Dispense: 3 mL; Refill: 5  Type 2 diabetes mellitus with diabetic polyneuropathy, with long-term current use of insulin  (HCC) -     [name] FlexTouch; Inject 15 Units into the skin at bedtime.  Dispense: 3 mL; Refill: 5 -     FreeStyle Libre 3 Sensor; 1 each by Does not apply route every 14 (fourteen) days. Please apply for 14 days and then switch to new sensor  Dispense: 2 each; Refill: 3 -     FreeStyle Libre 3 Reader; Use as directed to check blood sugars  Dispense: 1 each; Refill: 11 -     Insulin  Lispro (1 Unit Dial); Inject per sliding scale three times daily, as needed, not to exceed 60 units daily  Dispense: 3 mL; Refill: 5  Insulin -requiring or dependent type II diabetes mellitus (HCC) -     [name] FlexTouch;  Inject 15 Units into the skin at bedtime.  Dispense: 3 mL; Refill: 5 -     FreeStyle Libre 3 Sensor; 1 each by Does not apply route every 14 (fourteen) days. Please apply for 14 days and then switch to new sensor  Dispense: 2 each; Refill: 3 -     FreeStyle Libre 3 Reader; Use as directed to check blood sugars  Dispense: 1 each; Refill: 11 -     Insulin  Lispro (1 Unit Dial); Inject per sliding scale three times daily, as needed, not to exceed 60 units daily  Dispense: 3 mL; Refill: 5  Assessment and Plan    Type 2 Diabetes Mellitus Type 2 diabetes with frequent, significant  nocturnal hypoglycemia. Current regimen includes regular insulin , Farxiga , and Trulicity . A1c is 7.7, target is 6.5. Discussed switching to basal insulin  and Prn sliding scale insulin  for better control and reduced hypoglycemia risk. [name] and insulin  lispro preferred. It appears in the past insurance coverage was cost prohibitive for any insulin  other than regular. - Will start with low dose Tresiba 15 units for basal regimen. - Prescribe Freestyle Libre for CGM -dexcom does not - Send prescription to [facility] for insurance. - Sign up for MyChart.  Hypoglycemia Recurrent nocturnal hypoglycemia likely due to insulin  regimen and meal timing. Discussed regimen and meal adjustments to prevent episodes. - Switch to [place] for basal insulin . - Use Freestyle Libre for continuous monitoring. - Educate on meal timing and composition.  Shortness of breath Shortness of breath with improvement. Current treatment with Lasix  and spironolactone . Blood pressure controlled. - Continue Lasix  and spironolactone .         Return in about 3 weeks (around 02/13/2024).   Mandy Second, PA

## 2024-01-27 ENCOUNTER — Other Ambulatory Visit (HOSPITAL_COMMUNITY): Payer: Self-pay

## 2024-01-28 ENCOUNTER — Encounter (HOSPITAL_COMMUNITY): Admitting: Physical Therapy

## 2024-01-29 ENCOUNTER — Ambulatory Visit: Admitting: Family Medicine

## 2024-01-29 ENCOUNTER — Ambulatory Visit (HOSPITAL_COMMUNITY): Attending: Urgent Care | Admitting: Physical Therapy

## 2024-01-29 ENCOUNTER — Other Ambulatory Visit: Payer: Self-pay | Admitting: Family Medicine

## 2024-01-29 DIAGNOSIS — I890 Lymphedema, not elsewhere classified: Secondary | ICD-10-CM | POA: Insufficient documentation

## 2024-01-29 NOTE — Therapy (Signed)
 OUTPATIENT PHYSICAL THERAPY LYMPHEDEMA TREATMENT  Patient Name: [name] MRN: [id number] DOB:Jan 01, 1946, 79 y.o., female Today's Date: 01/29/2024  END OF SESSION:  PT End of Session - 01/29/24 1610     Visit Number 4    Number of Visits 18    Date for PT Re-Evaluation 03/02/24    Authorization Type [employer]    PT Start Time 1445    PT Stop Time 1600    PT Time Calculation (min) 75 min    Activity Tolerance Patient tolerated treatment well    Behavior During Therapy [facility] for tasks assessed/performed             Past Medical History:  Diagnosis Date   Carpal tunnel syndrome of right wrist 03/2013   recurrent   Chronic diastolic heart failure (HCC)    Cirrhosis, 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 ? to get liver bx if she gets bariatric surgery? Mild portal hypertensive gastropathy on EGD 08/2019.   Fibromyalgia    GAD (generalized anxiety disorder)    GERD    History of hiatal hernia    History of iron  deficiency anemia 12/2018   Inadequate absorption secondary to chronic/long term PPI therapy + portal hypertensive gastroduodenopathy. No GIB found on EGD, colonosc, and givens. Iron  infusions X multiple.   History of thrombocytopenia 12/2011   Hyperlipidemia    Intolerant of statins   HYPERTENSION    IBS (irritable bowel syndrome)    -D.  Good response to bentyl  and imodium  as of 06/2018 GI f/u.   IDDM (insulin  dependent diabetes mellitus)    with DPN (managed by Dr. Aldona Amel but then in 2018 pt preferred to have me manage for her convenience)   Limited mobility    Requires a walker for arthritic pain, widespread musculoskeletal pain, and neuropathic pain.   Morbid obesity (HCC)    As of 11/2018, pt considering sleeve gastectomy vs bipass as of eval by Dr. Molli Angelucci considering as of 12/2019.   Nonalcoholic steatohepatitis    Viral Hep screens NEG.  CT 2015.  Transaminasemia.  U/S 07/2018 showed early changes of  cirrhosis.   OSA (obstructive sleep apnea) 09/14/2015   sleep study 09/07/15: severe obstructive sleep apnea with an AHI of 72 and SaO2 low of 75%.>referred to sleep MD   Osteoarthritis    hips, shoulders, knees   PAF (paroxysmal atrial fibrillation) (HCC)    One documented episode (after getting EGD 2016).  Was on amiodarone  x 3 mo.  Rate control with metoprolol  + anticoag with xarelto . Watchman 12/2022, off anticoag   Portal hypertensive gastropathy (HCC)    hemorrhagic gastropathy + non-bleeding gastric ulcers on EGD 08/2023   Recurrent epistaxis    Granuloma in L nare cauterized by ENT 04/2020. Another cautery 06/2020   Small fiber neuropathy    Due to DM.  Symmetric hands and feet tingling/numbness.   Ulcerative colitis (HCC)    Remicade  infusion Q 8 weeks: in clinical and endoscopic remission as of 12/2018 GI f/u.  06/11/19 rpt colonoscopy->cecal and ascending colon colitis.   Past Surgical History:  Procedure Laterality Date   ABDOMINAL HYSTERECTOMY  1980   Paps no longer indicated.   BACK SURGERY     BACTERIAL OVERGROWTH TEST N/A 07/13/2015   Procedure: BACTERIAL OVERGROWTH TEST;  Surgeon: Ruby Corporal, MD;  [facility];  Service: Endoscopy;  Laterality: N/A;  730     BILATERAL SALPINGOOPHORECTOMY  02/10/2001   BIOPSY  06/11/2019  Procedure: BIOPSY;  Surgeon: Ruby Corporal, MD;  [facility];  Service: Endoscopy;;  colon   BIOPSY  09/27/2019   Procedure: BIOPSY;  Surgeon: Ruby Corporal, MD;  [facility];  Service: Endoscopy;;  gastric duodenal   BIOPSY  09/15/2020   Procedure: BIOPSY;  Surgeon: Urban Garden, MD;  [facility];  Service: Gastroenterology;;   BIOPSY  09/25/2023   Procedure: BIOPSY;  Surgeon: Urban Garden, MD;  [facility];  Service: Gastroenterology;;   BREAST REDUCTION SURGERY  1994   bilat   BREAST SURGERY     CARDIOVASCULAR STRESS TEST  07/2010   Lexiscan myoview:  normal   CARPAL TUNNEL RELEASE Right 1996   CARPAL TUNNEL RELEASE Left 03/21/2003   CARPAL TUNNEL RELEASE Right 05/04/2013   Procedure: CARPAL TUNNEL RELEASE;  Surgeon: Amelie Baize., MD;  Location: [facility];  Service: Orthopedics;  Laterality: Right;   CARPAL TUNNEL RELEASE Left 09/21/2013   Procedure: LEFT CARPAL TUNNEL RELEASE;  Surgeon: Amelie Baize., MD;  Location: [facility];  Service: Orthopedics;  Laterality: Left;   CHOLECYSTECTOMY     COLONOSCOPY WITH PROPOFOL  N/A 08/04/2015   Colitis in remission.  No polyps.  Procedure: COLONOSCOPY WITH PROPOFOL ;  Surgeon: Ruby Corporal, MD;  [facility]: [facility];  Service: Endoscopy;  Laterality: N/A;  cecum time in  0820   time out  0827    total time 7 minutes   COLONOSCOPY WITH PROPOFOL  N/A 06/11/2019   cecal and ascending colon colitis.  Procedure: COLONOSCOPY WITH PROPOFOL ;  Surgeon: Ruby Corporal, MD;  [facility];  Service: Endoscopy;  Laterality: N/A;  730a   COLONOSCOPY WITH PROPOFOL  N/A 09/15/2020   Procedure: COLONOSCOPY WITH PROPOFOL ;  Surgeon: Urban Garden, MD;  [facility];  Service: Gastroenterology;  Laterality: N/A;  1030   ESOPHAGEAL DILATION N/A 08/04/2015   Procedure: ESOPHAGEAL DILATION;  Surgeon: Ruby Corporal, MD;  [facility]: [facility];  Service: Endoscopy;  Laterality: N/AFelicita Horns, no mucousal disruption   ESOPHAGOGASTRODUODENOSCOPY  09/27/2019   Performed for IDA.  Esoph dilation was done but no stricture present.  Mild portal hypertensive gastropathy, o/w normal.  Duodenal bx NEG.  h pylori neg.   ESOPHAGOGASTRODUODENOSCOPY     09/25/2023 inactive chronic gastritis, h pylor neg. benign   ESOPHAGOGASTRODUODENOSCOPY     09/25/23 hemorrhagic portal gastropathy + nonbleeding gastric ulcers (H pylori neg)   ESOPHAGOGASTRODUODENOSCOPY (EGD) WITH ESOPHAGEAL DILATION  12/02/2005   ESOPHAGOGASTRODUODENOSCOPY (EGD) WITH PROPOFOL  N/A 08/04/2015    Procedure: ESOPHAGOGASTRODUODENOSCOPY (EGD) WITH PROPOFOL ;  Surgeon: Ruby Corporal, MD;  [facility]: [facility];  Service: Endoscopy;  Laterality: N/A;  procedure 1   ESOPHAGOGASTRODUODENOSCOPY (EGD) WITH PROPOFOL  N/A 09/27/2019   Procedure: ESOPHAGOGASTRODUODENOSCOPY (EGD) WITH PROPOFOL ;  Surgeon: Ruby Corporal, MD;  [facility];  Service: Endoscopy;  Laterality: N/A;  12:10   ESOPHAGOGASTRODUODENOSCOPY (EGD) WITH PROPOFOL  N/A 10/31/2021   Procedure: ESOPHAGOGASTRODUODENOSCOPY (EGD) WITH PROPOFOL ;  Surgeon: Ruby Corporal, MD;  [facility];  Service: Endoscopy;  Laterality: N/A;  930   ESOPHAGOGASTRODUODENOSCOPY (EGD) WITH PROPOFOL  N/A 09/25/2023   Procedure: ESOPHAGOGASTRODUODENOSCOPY (EGD) WITH PROPOFOL ;  Surgeon: Urban Garden, MD;  [facility];  Service: Gastroenterology;  Laterality: N/A;  10:30AM;ASA 3   FLEXIBLE SIGMOIDOSCOPY  01/17/2012   Procedure: FLEXIBLE SIGMOIDOSCOPY;  Surgeon: Ruby Corporal, MD;  [facility];  Service: Endoscopy;  Laterality: N/A;   GIVENS CAPSULE STUDY N/A 08/03/2019   Procedure: GIVENS CAPSULE STUDY (performed for IDA)->some food debris in stomach and small amount of blood.  Surgeon: Ruby Corporal, MD;  [facility];  Service: Endoscopy;  Laterality: N/A;  730AM   HEMILAMINOTOMY LUMBAR SPINE Bilateral 09/07/1999   L4-5   HOT HEMOSTASIS  09/25/2023   Procedure: HOT HEMOSTASIS (ARGON PLASMA COAGULATION/BICAP);  Surgeon: Umberto Ganong, Bearl Limes, MD;  [facility];  Service: Gastroenterology;;   KNEE ARTHROSCOPY Right 01/1999; 10/2000   LEFT ATRIAL APPENDAGE OCCLUSION N/A 01/23/2023   Procedure: LEFT ATRIAL APPENDAGE OCCLUSION;  Surgeon: Arnoldo Lapping, MD;  Location: [facility] INVAS[facility] CV LAB;  Service: Cardiovascular;  Laterality: N/A;   LEFT ATRIAL APPENDAGE OCCLUSION     LYSIS OF ADHESION  02/10/2001   MALONEY DILATION  09/27/2019   Procedure: MALONEY DILATION;  Surgeon: Ruby Corporal, MD;  [facility];  Service: Endoscopy;;   NASAL ENDOSCOPY WITH EPISTAXIS CONTROL Bilateral 01/25/2022   Procedure: NASAL ENDOSCOPY WITH EPISTAXIS CONTROL;  Surgeon: Ammon Bales, MD;  Location: [facility];  Service: ENT;  Laterality: Bilateral;   NASAL SEPTOPLASTY W/ TURBINOPLASTY Bilateral 01/25/2022   Procedure: NASAL SEPTOPLASTY WITH TURBINATE REDUCTION;  Surgeon: Ammon Bales, MD;  Location: [facility] OR;  Service: ENT;  Laterality: Bilateral;   RECTOCELE REPAIR  1990; 09/12/2006   TARSAL TUNNEL RELEASE  2002   TEE WITHOUT CARDIOVERSION N/A 01/23/2023   Procedure: TRANSESOPHAGEAL ECHOCARDIOGRAM;  Surgeon: Arnoldo Lapping, MD;  Location: [facility] INVASIVE CV LAB;  Service: Cardiovascular;  Laterality: N/A;   TRANSESOPHAGEAL ECHOCARDIOGRAM (CATH LAB) N/A 08/26/2023   4mm leak around watchman device. Procedure: TRANSESOPHAGEAL ECHOCARDIOGRAM;  Surgeon: Euell Herrlich, MD;  Location: [facility] INVASIVE CV LAB;  Service: Cardiovascular;  Laterality: N/A;   TRANSTHORACIC ECHOCARDIOGRAM  08/04/2015   EF 60-65%, normal wall motion, mild LVH, mild LA dilation, grd I DD.  11/2023 overall poor quality, EF 55-60%   TUMOR EXCISION Left 03/21/2003   dorsal 1st web space (hand)   URETEROLYSIS Right 02/10/2001   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   Fecal urgency 08/02/2020   Deviated septum 07/28/2020   Epistaxis, recurrent 05/15/2020    Ulcerative colitis without complications (HCC) 08/24/2019   Gastrointestinal hemorrhage 08/24/2019   Iron  deficiency anemia due to chronic blood loss 07/28/2019   Iron  deficiency anemia 05/20/2019   Heme positive stool 05/20/2019   History of iron  deficiency anemia 12/2018   Vertigo 12/04/2015   Uncontrolled type 2 diabetes mellitus with peripheral neuropathy 11/14/2015   OSA (obstructive sleep apnea), severe 09/14/2015   B12 deficiency 08/17/2015   Vitamin B6 deficiency 08/17/2015   PAF (paroxysmal atrial fibrillation) (HCC) 08/17/2015   Chronic anticoagulation 08/17/2015   Atrial fibrillation with RVR (HCC) 08/04/2015   Dysphagia    Gastroesophageal reflux disease without esophagitis    Lymphocytosis 07/04/2015   Obesity 05/18/2014   Allergic sinusitis 12/09/2013   Myalgia 04/28/2012   Polyarthralgia 04/28/2012   Elevated LFTs  01/16/2012   Insomnia 01/12/2012   Small fiber neuropathy 05/30/2011   Anxiety 05/30/2011   Dyslipidemia 01/24/2011   PALPITATIONS, RECURRENT 08/08/2010   Essential hypertension 04/12/2010   GERD 04/12/2010   Ulcerative colitis (HCC) 04/12/2010    PCP: Mandy Second, PA  REFERRING PROVIDER: Mandy Second, PA  REFERRING DIAG: I87.2 (ICD-10-CM) - Venous insufficiency of both lower extremities I89.0,E66.9 (ICD-10-CM) - Lymphedema associated with obesity I87.2 (ICD-10-CM) - Venous stasis dermatitis of both lower extremities  THERAPY DIAG:  Lymphedema, not elsewhere classified  Rationale for Evaluation and Treatment: Rehabilitation  ONSET DATE: chronic but has increased significantly in the past year.   SUBJECTIVE:                                                                                                                                                                                           SUBJECTIVE STATEMENT: 01/29/24:  Pt arrived with spouse, bandaging rolled.  Very pleased with reduction.     Eval:  She notes swelling in her  legs, particularly in the feet, which has worsened since her discharge from the hospital in January. She is currently taking furosemide  60 mg daily, taking three 20mg  tabs in the morning and three in the evening, but has not noticed significant improvement in the swelling.   Pt states that her mother had large legs.  She has been elevating her legs and completing ankle pumps since the beginning of February without improvement.  She has tried compression but her legs are to big for the garments at this time. Pt states that her edema is from the knees down.  Pt states that she is now in a size up from her normal shoe size and has to take the shoe laces out to fit in them.      PERTINENT HISTORY:  MISAO NYKAMP is a 78 y.o. female with past medical history significant for chronic diastolic congestive heart failure, proximal atrial fibrillation not on anticoagulation due to recurrent epistaxis s/p Watchman procedure (12/2022), CAD on aspirin , type 2 diabetes mellitus, anxiety/depression, nonalcoholic cirrhosis, OSA on CPAP, morbid obesity hospitalized for acute CHF from 10/31/23-11/04/2574 .     PAIN:  Are you having pain?   NPRS scale: 0/10  PRECAUTIONS: Other: cellulitis    RED FLAGS: None   WEIGHT BEARING RESTRICTIONS: No  FALLS:  Has patient fallen in last 6 months? No  LIVING ENVIRONMENT: Lives with: lives with their family Lives in: House/apartment Stairs: Yes: Internal: 2 steps; none and External: 2 steps; none Has following equipment at home: Quad cane small base  PATIENT GOALS: less swelling    OBJECTIVE:  Note: Objective measures were completed at Evaluation unless otherwise noted.  COGNITION: Overall cognitive status: Within functional limits for tasks assessed   PALPATION: Increased induration greater in LE but noted in thigh area as well   OBSERVATIONS / OTHER ASSESSMENTS: slight redness and thickening of skin   POSTURE: forward head, rounded shoulders and increased  kyphosis  All within normal limits:   LYMPHEDEMA ASSESSMENTS:    LE LANDMARK RIGHT eval Rigiht 01/29/24  At groin    30 cm proximal to suprapatella    20 cm proximal to suprapatella    10 cm proximal to suprapatella    At midpatella / popliteal crease 52.5 46.3  30 cm proximal to floor at lateral plantar foot 53.5 45.3  20 cm proximal to floor at lateral plantar foot 44  36.6  10 cm proximal to floor at lateral plantar foot 29 27.2  Circumference of ankle/heel 33.5 33.4  5 cm proximal to 1st MTP joint 24 24  Across MTP joint 23 22.8  Around proximal great toe    (Blank rows = not tested)  LE LANDMARK LEFT eval Left 01/29/24  At groin    30 cm proximal to suprapatella    20 cm proximal to suprapatella    10 cm proximal to suprapatella    At midpatella / popliteal crease 53 47  30 cm proximal to floor at lateral plantar foot 54.2 50.2  20 cm proximal to floor at lateral plantar foot 40.5 40.5  10 cm proximal to floor at lateral plantar foot 32.3 29.5  Circumference of ankle/heel 35.4 34  5 cm proximal to 1st MTP joint 24.3 23.6  Across MTP joint 23.3 22.5  Around proximal great toe    (Blank rows = not tested)  FUNCTIONAL TESTS:  Gait : 1:30 minutes to ambulate with quad cane x 80 ft    TODAY'S TREATMENT:                                                                                                                              DATE:  01/29/24 Measurements Manual:  Lymph drainage for bil LE's anteriorly only including short neck, deep and superficial abdominal.  Routing bil inguinal-axillary anastomosis. Compression:  Multiple layers of short stretch bandaging with 1/2" foam.  #7 netting used following application of lotion.  01/26/24: Reviewed current HEP with handout given and educated importance of daily completion Educated self manual with multimodal cueing for proper pressure, circle and pump techniques and correct direction Manual decongestive massage for inguinal  to axillary anastomosis including short neck, superficial then deep abdominal, axillary, BLE anterior   01/22/24:  Educated 4 components of lymphedema treatment Cut foam (under cabinet) Reviewed HEP including: ankle pumps, LAQ, hip ab/adduction, marching and diaphragm breath Manual decongestive massage for inguinal to axillary anastomosis including short neck, superficial then deep abdominal, axillary, BLE anterior supine and posterior in sidelying Reviewed self manual and given handout   01/20/2024 : what  lymphedema is and how it is controled, LE exercises, what bandages to order.    PATIENT EDUCATION:  Education details: skin care recommending using Eucerin or Aquaphor, exercise to include ankle pumps, LAQ, hip ab/adduction, marching and diaphragm breath, what manual will be like as well as multilayer compression dressing.  Person educated: Patient and Spouse Education method: Explanation, Verbal cues, and Handouts Education comprehension: verbalized understanding and returned demonstration  HOME EXERCISE PROGRAM: ankle pumps, LAQ, hip ab/adduction, marching and diaphragm breath  ASSESSMENT:  CLINICAL IMPRESSION: 01/29/24:  Pt reports satisfaction with comfort of bandages and pleased with how much they have went down.  LE's measured today with noted reduction especially most proximal LE's.   Manual completed this session in anterior only as pt had to use restroom and took extended amount of time.  Continued with bandaging to knee level only.  Pt agreeable to add thighs next session.      Eval:  Patient is a 78 y.o. female who was seen today for physical therapy evaluation and treatment for B LE lymphedema.  Evaluation demonstrates increased swelling of B LE with skin thickening and induration.  She has some redness along the anterior aspect of both LE.  Therapist explained that there is induration in pt thigh area as well and this area would benefit from decongestive techniques as well.  Ms  Shank will decide if she is interested in compression bandaging to the thigh level or if she will wear capri compression.  Ms. Schnebly has recently been hospitalized for acute CHF therefore the therapist messaged  her cardiologist  to see if she is able to go thru total decongestive techniques. (Viraat Vanpatten Micael Adas was CC on this due to therapist being out of town next week.   OBJECTIVE IMPAIRMENTS: cardiopulmonary status limiting activity, decreased activity tolerance, decreased mobility, difficulty walking, decreased ROM, decreased strength, increased edema, and obesity.   ACTIVITY LIMITATIONS: carrying, lifting, stairs, bathing, dressing, and locomotion level  PARTICIPATION LIMITATIONS: shopping and community activity  PERSONAL FACTORS: Age, Fitness, Time since onset of injury/illness/exacerbation, and 1-2 comorbidities: CHF,   are also affecting patient's functional outcome.   REHAB POTENTIAL: Good  CLINICAL DECISION MAKING: Evolving/moderate complexity  EVALUATION COMPLEXITY: Moderate  GOALS: Goals reviewed with patient? Yes  SHORT TERM GOALS: Target date: 02/10/24  PT to be I in HEP to improve lymphatic circulation Baseline: Goal status: INITIAL  2.  PT to have lost 1-2 cm in her feet to be able to fit in her shoes, 2-3 cm in legs for improved skin integrity. Baseline:  Goal status: INITIAL   LONG TERM GOALS: Target date: 03/02/24  PT to have lost 2 -3 cm in her feet, 3-5 cm in her LE for decreased risk of cellulitis.  Baseline:  Goal status: INITIAL  2.  Pt to have aqcuired juxtafit compression garment and be able to don to be able to continue maintenance phase of treatment.  Baseline:  Goal status: INITIAL  3.  PT to be I in self manual to assist in increasing lymphatic circulation Baseline:  Goal status: INITIAL  PLAN:  PT FREQUENCY: 3x/week  PT DURATION: 6 weeks  PLANNED INTERVENTIONS: 97110-Therapeutic exercises, 97535- Self Care, and 65784- Manual therapy  PLAN FOR  NEXT SESSION:  continue manual.  Begin thigh bandaging next visit (foam in bottom right cabinet).    Lorenso Romance, PTA/CLT [facility] Ph: [phone]  01/29/2024, 4:11 PM

## 2024-01-30 ENCOUNTER — Ambulatory Visit: Admitting: Family Medicine

## 2024-01-30 ENCOUNTER — Encounter: Payer: Self-pay | Admitting: Family Medicine

## 2024-01-30 ENCOUNTER — Other Ambulatory Visit: Payer: Self-pay

## 2024-01-30 VITALS — BP 111/74 | HR 118 | Temp 98.1°F | Ht 62.0 in | Wt 211.6 lb

## 2024-01-30 DIAGNOSIS — E119 Type 2 diabetes mellitus without complications: Secondary | ICD-10-CM

## 2024-01-30 DIAGNOSIS — E118 Type 2 diabetes mellitus with unspecified complications: Secondary | ICD-10-CM

## 2024-01-30 DIAGNOSIS — Z794 Long term (current) use of insulin: Secondary | ICD-10-CM

## 2024-01-30 DIAGNOSIS — E1142 Type 2 diabetes mellitus with diabetic polyneuropathy: Secondary | ICD-10-CM

## 2024-01-30 DIAGNOSIS — E1140 Type 2 diabetes mellitus with diabetic neuropathy, unspecified: Secondary | ICD-10-CM

## 2024-01-30 MED ORDER — TRESIBA FLEXTOUCH 100 UNIT/ML ~~LOC~~ SOPN: 15.0000 [IU] | PEN_INJECTOR | Freq: Every day | SUBCUTANEOUS | 5 refills | Status: DC

## 2024-01-30 NOTE — Progress Notes (Signed)
 OFFICE VISIT  01/30/2024  CC:  Chief Complaint  Patient presents with   Medical Management of Chronic Issues    Has not started [facility], needs assistance with placement    Patient is a 78 y.o. female who presents accompanied by her husband Marylou Sobers for 1 week follow-up diabetes. She was started on a basal/bolus insulin  regimen at last visit by Corita Diego, PA. Tresiba  15 units at bedtime.  Sliding scale rapid acting insulin  with meals. CGM was prescribed.  INTERIM HX: Pharmacy issues occurred in she says she never got the Tresiba . She has Humalog  but has not started it.  She has continued on her U-500 regimen. She did not bring her list of glucoses today but she recalls that last night her glucose around bedtime was 121.  Yesterday morning it was around 200.   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 ? to get liver bx if she gets bariatric surgery? Mild portal hypertensive gastropathy on EGD 08/2019.   Fibromyalgia    GAD (generalized anxiety disorder)    GERD    History of hiatal hernia    History of iron  deficiency anemia 12/2018   Inadequate absorption secondary to chronic/long term PPI therapy + portal hypertensive gastroduodenopathy. No GIB found on EGD, colonosc, and givens. Iron  infusions X multiple.   History of thrombocytopenia 12/2011   Hyperlipidemia    Intolerant of statins   HYPERTENSION    IBS (irritable bowel syndrome)    -D.  Good response to bentyl  and imodium  as of 06/2018 GI f/u.   IDDM (insulin  dependent diabetes mellitus)    with DPN (managed by Dr. Aldona Amel but then in 2018 pt preferred to have me manage for her convenience)   Limited mobility    Requires a walker for arthritic pain, widespread musculoskeletal pain, and neuropathic pain.   Morbid obesity  (HCC)    As of 11/2018, pt considering sleeve gastectomy vs bipass as of eval by Dr. Molli Angelucci considering as of 12/2019.   Nonalcoholic steatohepatitis    Viral Hep screens NEG.  CT 2015.  Transaminasemia.  U/S 07/2018 showed early changes of cirrhosis.   OSA (obstructive sleep apnea) 09/14/2015   sleep study 09/07/15: severe obstructive sleep apnea with an AHI of 72 and SaO2 low of 75%.>referred to sleep MD   Osteoarthritis    hips, shoulders, knees   PAF (paroxysmal atrial fibrillation) (HCC)    One documented episode (after getting EGD 2016).  Was on amiodarone  x 3 mo.  Rate control with metoprolol  + anticoag with xarelto . Watchman 12/2022, off anticoag   Portal hypertensive gastropathy (HCC)    hemorrhagic gastropathy + non-bleeding gastric ulcers on EGD 08/2023   Recurrent epistaxis    Granuloma in L nare cauterized by ENT 04/2020. Another cautery 06/2020   Small fiber neuropathy    Due to DM.  Symmetric hands and feet tingling/numbness.   Ulcerative colitis (HCC)    Remicade  infusion Q 8 weeks: in clinical and endoscopic remission as of 12/2018 GI f/u.  06/11/19 rpt colonoscopy->cecal and ascending colon colitis.    Past Surgical History:  Procedure Laterality Date   ABDOMINAL HYSTERECTOMY  1980   Paps no longer indicated.   BACK SURGERY     BACTERIAL OVERGROWTH TEST N/A 07/13/2015   Procedure: BACTERIAL OVERGROWTH TEST;  Surgeon: Ruby Corporal, MD;  [facility];  Service: Endoscopy;  Laterality: N/A;  730     BILATERAL SALPINGOOPHORECTOMY  02/10/2001   BIOPSY  06/11/2019   Procedure: BIOPSY;  Surgeon: Ruby Corporal, MD;  [facility];  Service: Endoscopy;;  colon   BIOPSY  09/27/2019   Procedure: BIOPSY;  Surgeon: Ruby Corporal, MD;  [facility];  Service: Endoscopy;;  gastric duodenal   BIOPSY  09/15/2020   Procedure: BIOPSY;  Surgeon: Urban Garden, MD;  [facility];  Service: Gastroenterology;;   BIOPSY   09/25/2023   Procedure: BIOPSY;  Surgeon: Urban Garden, MD;  [facility];  Service: Gastroenterology;;   BREAST REDUCTION SURGERY  1994   bilat   BREAST SURGERY     CARDIOVASCULAR STRESS TEST  07/2010   Lexiscan myoview: normal   CARPAL TUNNEL RELEASE Right 1996   CARPAL TUNNEL RELEASE Left 03/21/2003   CARPAL TUNNEL RELEASE Right 05/04/2013   Procedure: CARPAL TUNNEL RELEASE;  Surgeon: Amelie Baize., MD;  Location: [facility];  Service: Orthopedics;  Laterality: Right;   CARPAL TUNNEL RELEASE Left 09/21/2013   Procedure: LEFT CARPAL TUNNEL RELEASE;  Surgeon: Amelie Baize., MD;  Location: [facility];  Service: Orthopedics;  Laterality: Left;   CHOLECYSTECTOMY     COLONOSCOPY WITH PROPOFOL  N/A 08/04/2015   Colitis in remission.  No polyps.  Procedure: COLONOSCOPY WITH PROPOFOL ;  Surgeon: Ruby Corporal, MD;  [facility]: [facility];  Service: Endoscopy;  Laterality: N/A;  cecum time in  0820   time out  0827    total time 7 minutes   COLONOSCOPY WITH PROPOFOL  N/A 06/11/2019   cecal and ascending colon colitis.  Procedure: COLONOSCOPY WITH PROPOFOL ;  Surgeon: Ruby Corporal, MD;  [facility];  Service: Endoscopy;  Laterality: N/A;  730a   COLONOSCOPY WITH PROPOFOL  N/A 09/15/2020   Procedure: COLONOSCOPY WITH PROPOFOL ;  Surgeon: Urban Garden, MD;  [facility];  Service: Gastroenterology;  Laterality: N/A;  1030   ESOPHAGEAL DILATION N/A 08/04/2015   Procedure: ESOPHAGEAL DILATION;  Surgeon: Ruby Corporal, MD;  [facility]: [facility];  Service: Endoscopy;  Laterality: N/AFelicita Horns, no mucousal disruption   ESOPHAGOGASTRODUODENOSCOPY  09/27/2019   Performed for IDA.  Esoph dilation was done but no stricture present.  Mild portal hypertensive gastropathy, o/w normal.  Duodenal bx NEG.  h pylori neg.   ESOPHAGOGASTRODUODENOSCOPY     09/25/2023 inactive chronic gastritis, h pylor neg. benign    ESOPHAGOGASTRODUODENOSCOPY     09/25/23 hemorrhagic portal gastropathy + nonbleeding gastric ulcers (H pylori neg)   ESOPHAGOGASTRODUODENOSCOPY (EGD) WITH ESOPHAGEAL DILATION  12/02/2005   ESOPHAGOGASTRODUODENOSCOPY (EGD) WITH PROPOFOL  N/A 08/04/2015   Procedure: ESOPHAGOGASTRODUODENOSCOPY (EGD) WITH PROPOFOL ;  Surgeon: Ruby Corporal, MD;  [facility]: [facility];  Service: Endoscopy;  Laterality: N/A;  procedure 1   ESOPHAGOGASTRODUODENOSCOPY (EGD) WITH PROPOFOL  N/A 09/27/2019   Procedure: ESOPHAGOGASTRODUODENOSCOPY (EGD) WITH PROPOFOL ;  Surgeon: Ruby Corporal, MD;  [facility];  Service: Endoscopy;  Laterality: N/A;  12:10   ESOPHAGOGASTRODUODENOSCOPY (EGD) WITH PROPOFOL  N/A 10/31/2021   Procedure: ESOPHAGOGASTRODUODENOSCOPY (EGD) WITH PROPOFOL ;  Surgeon: Ruby Corporal, MD;  [facility];  Service: Endoscopy;  Laterality: N/A;  930   ESOPHAGOGASTRODUODENOSCOPY (EGD) WITH PROPOFOL  N/A 09/25/2023   Procedure: ESOPHAGOGASTRODUODENOSCOPY (EGD) WITH PROPOFOL ;  Surgeon: Urban Garden, MD;  [facility];  Service: Gastroenterology;  Laterality: N/A;  10:30AM;ASA 3   FLEXIBLE SIGMOIDOSCOPY  01/17/2012   Procedure: FLEXIBLE SIGMOIDOSCOPY;  Surgeon: Ruby Corporal, MD;  [facility];  Service: Endoscopy;  Laterality: N/A;   GIVENS CAPSULE STUDY N/A 08/03/2019   Procedure: GIVENS CAPSULE STUDY (performed for IDA)->some food debris in stomach and small amount of blood.  Surgeon: Ruby Corporal, MD;  [facility];  Service: Endoscopy;  Laterality: N/A;  730AM   HEMILAMINOTOMY LUMBAR SPINE Bilateral 09/07/1999   L4-5   HOT HEMOSTASIS  09/25/2023   Procedure: HOT HEMOSTASIS (ARGON PLASMA COAGULATION/BICAP);  Surgeon: Umberto Ganong, Bearl Limes, MD;  [facility];  Service: Gastroenterology;;   KNEE ARTHROSCOPY Right 01/1999; 10/2000   LEFT ATRIAL APPENDAGE OCCLUSION N/A 01/23/2023   Procedure: LEFT ATRIAL APPENDAGE OCCLUSION;   Surgeon: Arnoldo Lapping, MD;  Location: [facility] INVASIVE CV LAB;  Service: Cardiovascular;  Laterality: N/A;   LEFT ATRIAL APPENDAGE OCCLUSION     LYSIS OF ADHESION  02/10/2001   MALONEY DILATION  09/27/2019   Procedure: MALONEY DILATION;  Surgeon: Ruby Corporal, MD;  [facility];  Service: Endoscopy;;   NASAL ENDOSCOPY WITH EPISTAXIS CONTROL Bilateral 01/25/2022   Procedure: NASAL ENDOSCOPY WITH EPISTAXIS CONTROL;  Surgeon: Ammon Bales, MD;  Location: [facility];  Service: ENT;  Laterality: Bilateral;   NASAL SEPTOPLASTY W/ TURBINOPLASTY Bilateral 01/25/2022   Procedure: NASAL SEPTOPLASTY WITH TURBINATE REDUCTION;  Surgeon: Ammon Bales, MD;  Location: [facility] OR;  Service: ENT;  Laterality: Bilateral;   RECTOCELE REPAIR  1990; 09/12/2006   TARSAL TUNNEL RELEASE  2002   TEE WITHOUT CARDIOVERSION N/A 01/23/2023   Procedure: TRANSESOPHAGEAL ECHOCARDIOGRAM;  Surgeon: Arnoldo Lapping, MD;  Location: [facility] INVASIVE CV LAB;  Service: Cardiovascular;  Laterality: N/A;   TRANSESOPHAGEAL ECHOCARDIOGRAM (CATH LAB) N/A 08/26/2023   4mm leak around watchman device. Procedure: TRANSESOPHAGEAL ECHOCARDIOGRAM;  Surgeon: Euell Herrlich, MD;  Location: [facility] INVASIVE CV LAB;  Service: Cardiovascular;  Laterality: N/A;   TRANSTHORACIC ECHOCARDIOGRAM  08/04/2015   EF 60-65%, normal wall motion, mild LVH, mild LA dilation, grd I DD.  11/2023 overall poor quality, EF 55-60%   TUMOR EXCISION Left 03/21/2003   dorsal 1st web space (hand)   URETEROLYSIS Right 02/10/2001    Outpatient Medications Prior to Visit  Medication Sig Dispense Refill   Accu-Chek Softclix Lancets lancets USE TO CHECK BLOOD GLUCOSE UP TO 6 TIMES DAILY AS DIRECTED 200 each 5   acetaminophen  (TYLENOL ) 500 MG tablet Take 1,000 mg by mouth every 6 (six) hours as needed for mild pain (pain score 1-3).     albuterol  (VENTOLIN  HFA) 108 (90 Base) MCG/ACT inhaler Inhale 2 puffs into the lungs every 6 (six) hours as needed for wheezing or  shortness of breath. 8 g 0   aspirin  EC 81 MG tablet Take 1 tablet (81 mg total) by mouth daily. Swallow whole.     blood glucose meter kit and supplies KIT Use up to six times daily as directed. DX. E11.9 1 each 0   citalopram  (CELEXA ) 20 MG tablet TAKE 1 TABLET BY MOUTH EVERY DAY 90 tablet 1   Continuous Glucose Receiver (FREESTYLE LIBRE 3 READER) DEVI Use as directed to check blood sugars 1 each 11   Continuous Glucose Sensor (FREESTYLE LIBRE 3 SENSOR) MISC 1 each by Does not apply route every 14 (fourteen) days. Please apply for 14 days and then switch to new sensor 2 each 3   dapagliflozin  propanediol (FARXIGA ) 5 MG TABS  tablet Take 1 tablet (5 mg total) by mouth daily. 30 tablet 0   diphenoxylate -atropine  (LOMOTIL ) 2.5-0.025 MG tablet Take 1 tablet by mouth 4 (four) times daily as needed for diarrhea or loose stools. 60 tablet 2   Dulaglutide  (TRULICITY ) 1.5 MG/0.5ML SOAJ INJECT 1.5 MG (0.5ML) UNDER THE SKIN ONCE A WEEK 18 mL 0   esomeprazole  (NEXIUM ) 40 MG capsule Take 1 capsule (40 mg total) by mouth daily before breakfast. 90 capsule 3   furosemide  (LASIX ) 20 MG tablet Take 3 tablets (60 mg total) by mouth 2 (two) times daily. 540 tablet 0   gabapentin  (NEURONTIN ) 600 MG tablet TAKE 1 AND 1/2 TABLETS BY MOUTH TWICE A DAY 270 tablet 1   glucose blood (ACCU-CHEK GUIDE TEST) test strip USE TO CHECK BLOOD GLUCOSE UP TO 6 TIMES DAILY. 500 strip 1   HYDROcodone -acetaminophen  (NORCO/VICODIN) 5-325 MG tablet 1 tab po bid prn pain 60 tablet 0   INFLIXIMAB  IV Inject into the vein every 8 (eight) weeks. Remicaid 5mg /kg Infusion every 8 weeks ( [facility])     insulin  degludec (TRESIBA  FLEXTOUCH) 100 UNIT/ML FlexTouch Pen Inject 15 Units into the skin at bedtime. 3 mL 5   insulin  lispro (HUMALOG  KWIKPEN) 100 UNIT/ML KwikPen Inject per sliding scale three times daily, as needed, not to exceed 60 units daily 3 mL 5   Insulin  Pen Needle (B-D UF III MINI PEN NEEDLES) 31G X 5 MM MISC USE TO  INJECT INSULINS EQUAL TO 6 TIMES DAILY. 500 each 6   lactase (LACTAID) 3000 UNITS tablet Take 3,000 Units by mouth as needed (when eating foods containing dairy).      meclizine  (ANTIVERT ) 25 MG tablet Take 1 tablet (25 mg total) by mouth 3 (three) times daily as needed for dizziness or nausea. 90 tablet 2   metoprolol  succinate (TOPROL -XL) 100 MG 24 hr tablet TAKE 1 TABLET BY MOUTH 2 TIMES DAILY. TAKE WITH OR IMMEDIATELY FOLLOWING A MEAL 180 tablet 1   oxybutynin  (DITROPAN -XL) 5 MG 24 hr tablet TAKE 1 TABLET BY MOUTH EVERYDAY AT BEDTIME 30 tablet 0   potassium chloride  (KLOR-CON  M10) 10 MEQ tablet Take 1 tablet (10 mEq total) by mouth daily. (Patient taking differently: Take 20 mEq by mouth daily.) 90 tablet 1   spironolactone  (ALDACTONE ) 25 MG tablet Take 1 tablet (25 mg total) by mouth daily. 30 tablet 0   No facility-administered medications prior to visit.    Allergies  Allergen Reactions   Flagyl  [Metronidazole  Hcl] Other (See Comments)    DIAPHORESIS   Omeprazole  Anaphylaxis and Swelling    SWELLING OF TONGUE AND THROAT   Pioglitazone Other (See Comments) and Swelling    Weight gain, tongue swelling   Benzocaine-Menthol Swelling    SWELLING OF MOUTH   Metformin  And Related Diarrhea   Shrimp [Shellfish Allergy] Itching    OF THROAT AND EARS, if consumed raw   Statins Palpitations   Welchol [Colesevelam] Other (See Comments)    GI UPSET   Allevyn Adhesive [Wound Dressings]     Other reaction(s): Unknown   Jardiance  [Empagliflozin ] Other (See Comments)    weakness   Lactose Diarrhea and Other (See Comments)    Gas, bloating   Lactose Intolerance (Gi) Diarrhea    Gas, bloating   Adhesive [Tape] Other (See Comments)    SKIN IRRITATION AND BRUISING   Desipramine  Hcl Itching, Nausea Only and Other (See Comments)    "swimmy" headed, ears itched   Hydromorphone  Itching   Nisoldipine Itching  Percocet [Oxycodone -Acetaminophen ] Itching    Review of Systems As per  HPI  PE:    01/30/2024   10:32 AM 01/23/2024    3:04 PM 01/09/2024    3:23 PM  Vitals with BMI  Height 5\' 2"     Weight 211 lbs 10 oz 218 lbs 13 oz 227 lbs  BMI 38.69    Systolic 111 116 161  Diastolic 74 70 88  Pulse 118 76 107     Physical Exam  Gen: Alert, well appearing.  Patient is oriented to person, place, time, and situation. AFFECT: pleasant, lucid thought and speech. No further exam today  LABS:  Last CBC Lab Results  Component Value Date   WBC 5.0 11/17/2023   HGB 9.0 (L) 11/17/2023   HCT 29.7 (L) 11/17/2023   MCV 82.6 11/17/2023   MCH 24.3 (L) 11/02/2023   RDW 20.2 (H) 11/17/2023   PLT 152.0 11/17/2023   Lab Results  Component Value Date   IRON  53 11/17/2023   TIBC 328 11/17/2023   FERRITIN 21 11/17/2023   Last metabolic panel Lab Results  Component Value Date   GLUCOSE 339 (H) 11/17/2023   NA 139 11/17/2023   K 3.6 11/17/2023   CL 100 11/17/2023   CO2 31 11/17/2023   BUN 18 11/17/2023   CREATININE 0.93 11/17/2023   GFR 59.81 (L) 11/17/2023   CALCIUM  8.2 (L) 11/17/2023   PROT 6.1 (L) 11/03/2023   ALBUMIN 2.9 (L) 09/19/2023   LABGLOB 3.4 08/17/2015   AGRATIO 1.0 08/17/2015   BILITOT 1.0 09/19/2023   ALKPHOS 63 09/19/2023   AST 38 09/19/2023   ALT 20 09/19/2023   ANIONGAP 7 11/05/2023   Last hemoglobin A1c Lab Results  Component Value Date   HGBA1C 7.7 (A) 11/18/2023   HGBA1C 7.7 11/18/2023   HGBA1C 7.7 (A) 11/18/2023   HGBA1C 7.7 (A) 11/18/2023   IMPRESSION AND PLAN:  #1 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 She is in the midst of switching over from regular insulin  to basal bolus regimen. She is getting some assistance through [facility] and she will change over when she gets the Tresiba .  She will start 15 units of this and she will start using Humalog  as per sliding scale outlined last visit by Whitney:    Next hemoglobin A1c would be after 02/15/2024.  An After Visit Summary was printed and given to the  patient.  FOLLOW UP: 2 wks  Signed:  Arletha Lady, MD           01/30/2024

## 2024-02-04 ENCOUNTER — Ambulatory Visit (HOSPITAL_COMMUNITY): Admitting: Physical Therapy

## 2024-02-04 ENCOUNTER — Other Ambulatory Visit: Payer: Self-pay

## 2024-02-04 DIAGNOSIS — I890 Lymphedema, not elsewhere classified: Secondary | ICD-10-CM | POA: Diagnosis not present

## 2024-02-04 DIAGNOSIS — E669 Obesity, unspecified: Secondary | ICD-10-CM

## 2024-02-04 DIAGNOSIS — I872 Venous insufficiency (chronic) (peripheral): Secondary | ICD-10-CM

## 2024-02-04 MED ORDER — SPIRONOLACTONE 25 MG PO TABS: 25.0000 mg | ORAL_TABLET | Freq: Every day | ORAL | 0 refills | Status: DC

## 2024-02-04 NOTE — Therapy (Signed)
 OUTPATIENT PHYSICAL THERAPY LYMPHEDEMA TREATMENT  Patient Name: [name] MRN: [id number] DOB:03-27-1946, 78 y.o., female Today's Date: 02/04/2024  END OF SESSION:  PT End of Session - 02/04/24 1625     Visit Number 5    Number of Visits 18    Date for PT Re-Evaluation 03/02/24    Authorization Type [employer]    Authorization Time Period unable to find authorization- gave to resubmit.    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 ? to get liver bx if she gets bariatric surgery? Mild portal hypertensive gastropathy on EGD 08/2019.   Fibromyalgia    GAD (generalized anxiety disorder)    GERD    History of hiatal hernia    History of iron  deficiency anemia 12/2018   Inadequate absorption secondary to chronic/long term PPI therapy + portal hypertensive gastroduodenopathy. No GIB found on EGD, colonosc, and givens. Iron  infusions X multiple.   History of thrombocytopenia 12/2011   Hyperlipidemia    Intolerant of statins   HYPERTENSION    IBS (irritable bowel syndrome)    -D.  Good response to bentyl  and imodium  as of 06/2018 GI f/u.   IDDM (insulin  dependent diabetes mellitus)    with DPN (managed by Dr. Aldona Amel but then in 2018 pt preferred to have me manage for her convenience)   Limited mobility    Requires a walker for arthritic pain, widespread musculoskeletal pain, and neuropathic pain.   Morbid obesity (HCC)    As of 11/2018, pt considering sleeve gastectomy vs bipass as of eval by Dr. Molli Angelucci considering as of 12/2019.   Nonalcoholic steatohepatitis    Viral Hep  screens NEG.  CT 2015.  Transaminasemia.  U/S 07/2018 showed early changes of cirrhosis.   OSA (obstructive sleep apnea) 09/14/2015   sleep study 09/07/15: severe obstructive sleep apnea with an AHI of 72 and SaO2 low of 75%.>referred to sleep MD   Osteoarthritis    hips, shoulders, knees   PAF (paroxysmal atrial fibrillation) (HCC)    One documented episode (after getting EGD 2016).  Was on amiodarone  x 3 mo.  Rate control with metoprolol  + anticoag with xarelto . Watchman 12/2022, off anticoag   Portal hypertensive gastropathy (HCC)    hemorrhagic gastropathy + non-bleeding gastric ulcers on EGD 08/2023   Recurrent epistaxis    Granuloma in L nare cauterized by ENT 04/2020. Another cautery 06/2020   Small fiber neuropathy    Due to DM.  Symmetric hands and feet tingling/numbness.   Ulcerative colitis (HCC)    Remicade  infusion Q 8 weeks: in clinical and endoscopic remission as of 12/2018 GI f/u.  06/11/19 rpt colonoscopy->cecal and ascending colon colitis.   Past Surgical History:  Procedure Laterality Date   ABDOMINAL HYSTERECTOMY  1980   Paps no longer indicated.   BACK SURGERY     BACTERIAL OVERGROWTH TEST N/A 07/13/2015   Procedure: BACTERIAL OVERGROWTH TEST;  Surgeon: Ruby Corporal, MD;  [facility];  Service: Endoscopy;  Laterality: N/A;  730     BILATERAL SALPINGOOPHORECTOMY  02/10/2001   BIOPSY  06/11/2019   Procedure: BIOPSY;  Surgeon: Ruby Corporal, MD;  [facility];  Service: Endoscopy;;  colon   BIOPSY  09/27/2019   Procedure: BIOPSY;  Surgeon: Ruby Corporal, MD;  [facility];  Service: Endoscopy;;  gastric duodenal   BIOPSY  09/15/2020   Procedure: BIOPSY;  Surgeon: Urban Garden, MD;  [facility];  Service: Gastroenterology;;   BIOPSY  09/25/2023   Procedure: BIOPSY;  Surgeon: Urban Garden, MD;  [facility];  Service: Gastroenterology;;   BREAST REDUCTION SURGERY  1994   bilat    BREAST SURGERY     CARDIOVASCULAR STRESS TEST  07/2010   Lexiscan myoview: normal   CARPAL TUNNEL RELEASE Right 1996   CARPAL TUNNEL RELEASE Left 03/21/2003   CARPAL TUNNEL RELEASE Right 05/04/2013   Procedure: CARPAL TUNNEL RELEASE;  Surgeon: Amelie Baize., MD;  Location: [facility];  Service: Orthopedics;  Laterality: Right;   CARPAL TUNNEL RELEASE Left 09/21/2013   Procedure: LEFT CARPAL TUNNEL RELEASE;  Surgeon: Amelie Baize., MD;  Location: [facility];  Service: Orthopedics;  Laterality: Left;   CHOLECYSTECTOMY     COLONOSCOPY WITH PROPOFOL  N/A 08/04/2015   Colitis in remission.  No polyps.  Procedure: COLONOSCOPY WITH PROPOFOL ;  Surgeon: Ruby Corporal, MD;  [facility]: [facility];  Service: Endoscopy;  Laterality: N/A;  cecum time in  0820   time out  0827    total time 7 minutes   COLONOSCOPY WITH PROPOFOL  N/A 06/11/2019   cecal and ascending colon colitis.  Procedure: COLONOSCOPY WITH PROPOFOL ;  Surgeon: Ruby Corporal, MD;  [facility];  Service: Endoscopy;  Laterality: N/A;  730a   COLONOSCOPY WITH PROPOFOL  N/A 09/15/2020   Procedure: COLONOSCOPY WITH PROPOFOL ;  Surgeon: Urban Garden, MD;  [facility];  Service: Gastroenterology;  Laterality: N/A;  1030   ESOPHAGEAL DILATION N/A 08/04/2015   Procedure: ESOPHAGEAL DILATION;  Surgeon: Ruby Corporal, MD;  [facility];  Service: Endoscopy;  Laterality: N/AFelicita Horns, no mucousal disruption   ESOPHAGOGASTRODUODENOSCOPY  09/27/2019   Performed for IDA.  Esoph dilation was done but no stricture present.  Mild portal hypertensive gastropathy, o/w normal.  Duodenal bx NEG.  h pylori neg.   ESOPHAGOGASTRODUODENOSCOPY     09/25/2023 inactive chronic gastritis, h pylor neg. benign   ESOPHAGOGASTRODUODENOSCOPY     09/25/23 hemorrhagic portal gastropathy + nonbleeding gastric ulcers (H pylori neg)   ESOPHAGOGASTRODUODENOSCOPY (EGD) WITH ESOPHAGEAL DILATION   12/02/2005   ESOPHAGOGASTRODUODENOSCOPY (EGD) WITH PROPOFOL  N/A 08/04/2015   Procedure: ESOPHAGOGASTRODUODENOSCOPY (EGD) WITH PROPOFOL ;  Surgeon: Ruby Corporal, MD;  [facility];  Service: Endoscopy;  Laterality: N/A;  procedure 1   ESOPHAGOGASTRODUODENOSCOPY (EGD) WITH PROPOFOL  N/A 09/27/2019   Procedure: ESOPHAGOGASTRODUODENOSCOPY (EGD) WITH PROPOFOL ;  Surgeon: Ruby Corporal, MD;  [facility];  Service: Endoscopy;  Laterality: N/A;  12:10   ESOPHAGOGASTRODUODENOSCOPY (EGD) WITH PROPOFOL  N/A 10/31/2021   Procedure: ESOPHAGOGASTRODUODENOSCOPY (EGD) WITH PROPOFOL ;  Surgeon: Ruby Corporal, MD;  [facility];  Service: Endoscopy;  Laterality: N/A;  930   ESOPHAGOGASTRODUODENOSCOPY (EGD) WITH PROPOFOL  N/A 09/25/2023   Procedure: ESOPHAGOGASTRODUODENOSCOPY (EGD) WITH PROPOFOL ;  Surgeon: Urban Garden, MD;  [facility];  Service: Gastroenterology;  Laterality: N/A;  10:30AM;ASA 3   FLEXIBLE SIGMOIDOSCOPY  01/17/2012   Procedure:  FLEXIBLE SIGMOIDOSCOPY;  Surgeon: Ruby Corporal, MD;  [facility];  Service: Endoscopy;  Laterality: N/A;   GIVENS CAPSULE STUDY N/A 08/03/2019   Procedure: GIVENS CAPSULE STUDY (performed for IDA)->some food debris in stomach and small amount of blood.  Surgeon: Ruby Corporal, MD;  [facility];  Service: Endoscopy;  Laterality: N/A;  730AM   HEMILAMINOTOMY LUMBAR SPINE Bilateral 09/07/1999   L4-5   HOT HEMOSTASIS  09/25/2023   Procedure: HOT HEMOSTASIS (ARGON PLASMA COAGULATION/BICAP);  Surgeon: Umberto Ganong, Bearl Limes, MD;  [facility];  Service: Gastroenterology;;   KNEE ARTHROSCOPY Right 01/1999; 10/2000   LEFT ATRIAL APPENDAGE OCCLUSION N/A 01/23/2023   Procedure: LEFT ATRIAL APPENDAGE OCCLUSION;  Surgeon: Arnoldo Lapping, MD;  Location: [facility];  Service: Cardiovascular;  Laterality: N/A;   LEFT ATRIAL APPENDAGE OCCLUSION     LYSIS OF ADHESION  02/10/2001    MALONEY DILATION  09/27/2019   Procedure: MALONEY DILATION;  Surgeon: Ruby Corporal, MD;  [facility];  Service: Endoscopy;;   NASAL ENDOSCOPY WITH EPISTAXIS CONTROL Bilateral 01/25/2022   Procedure: NASAL ENDOSCOPY WITH EPISTAXIS CONTROL;  Surgeon: Ammon Bales, MD;  Location: [facility];  Service: ENT;  Laterality: Bilateral;   NASAL SEPTOPLASTY W/ TURBINOPLASTY Bilateral 01/25/2022   Procedure: NASAL SEPTOPLASTY WITH TURBINATE REDUCTION;  Surgeon: Ammon Bales, MD;  Location: [facility];  Service: ENT;  Laterality: Bilateral;   RECTOCELE REPAIR  1990; 09/12/2006   TARSAL TUNNEL RELEASE  2002   TEE WITHOUT CARDIOVERSION N/A 01/23/2023   Procedure: TRANSESOPHAGEAL ECHOCARDIOGRAM;  Surgeon: Arnoldo Lapping, MD;  Location: [facility];  Service: Cardiovascular;  Laterality: N/A;   TRANSESOPHAGEAL ECHOCARDIOGRAM (CATH LAB) N/A 08/26/2023   4mm leak around watchman device. Procedure: TRANSESOPHAGEAL ECHOCARDIOGRAM;  Surgeon: Euell Herrlich, MD;  Location: [facility]VASIVE CV LAB;  Service: Cardiovascular;  Laterality: N/A;   TRANSTHORACIC ECHOCARDIOGRAM  08/04/2015   EF 60-65%, normal wall motion, mild LVH, mild LA dilation, grd I DD.  11/2023 overall poor quality, EF 55-60%   TUMOR EXCISION Left 03/21/2003   dorsal 1st web space (hand)   URETEROLYSIS Right 02/10/2001   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   Fecal  urgency 08/02/2020   Deviated septum 07/28/2020   Epistaxis, recurrent 05/15/2020   Ulcerative colitis without complications (HCC) 08/24/2019   Gastrointestinal hemorrhage 08/24/2019   Iron  deficiency anemia due to chronic blood loss 07/28/2019   Iron  deficiency anemia 05/20/2019   Heme positive stool 05/20/2019   History of iron  deficiency anemia 12/2018   Vertigo 12/04/2015   Uncontrolled type 2 diabetes mellitus with peripheral neuropathy 11/14/2015   OSA (obstructive sleep apnea), severe 09/14/2015   B12 deficiency 08/17/2015   Vitamin B6 deficiency 08/17/2015   PAF (paroxysmal atrial fibrillation) (HCC) 08/17/2015   Chronic anticoagulation 08/17/2015   Atrial fibrillation with RVR (HCC) 08/04/2015   Dysphagia    Gastroesophageal reflux disease without esophagitis    Lymphocytosis 07/04/2015   Obesity 05/18/2014  Allergic sinusitis 12/09/2013   Myalgia 04/28/2012   Polyarthralgia 04/28/2012   Elevated LFTs 01/16/2012   Insomnia 01/12/2012   Small fiber neuropathy 05/30/2011   Anxiety 05/30/2011   Dyslipidemia 01/24/2011   PALPITATIONS, RECURRENT 08/08/2010   Essential hypertension 04/12/2010   GERD 04/12/2010   Ulcerative colitis (HCC) 04/12/2010    PCP: Mandy Second, PA  REFERRING PROVIDER: Mandy Second, PA  REFERRING DIAG: I87.2 (ICD-10-CM) - Venous insufficiency of both lower extremities I89.0,E66.9 (ICD-10-CM) - Lymphedema associated with obesity I87.2 (ICD-10-CM) - Venous stasis dermatitis of both lower extremities  THERAPY DIAG:  Lymphedema, not elsewhere classified  Rationale for Evaluation and Treatment: Rehabilitation  ONSET DATE: chronic but has increased significantly in the past year.   SUBJECTIVE:                                                                                                                                                                                           SUBJECTIVE STATEMENT:  Pt states she has had edema in her LE  since she was a teenager.   Pt states that she has been completing her exercises, completing some self manual and elevating her LE.      PERTINENT HISTORY:  [name] is a 78 y.o. female with past medical history significant for chronic diastolic congestive heart failure, proximal atrial fibrillation not on anticoagulation due to recurrent epistaxis s/p Watchman procedure (12/2022), CAD on aspirin , type 2 diabetes mellitus, anxiety/depression, nonalcoholic cirrhosis, OSA on CPAP, morbid obesity hospitalized for acute CHF from 10/31/23-11/04/2574 .     PAIN:  Are you having pain?   NPRS scale: 0/10  PRECAUTIONS: Other: cellulitis    RED FLAGS: None   WEIGHT BEARING RESTRICTIONS: No  FALLS:  Has patient fallen in last 6 months? No  LIVING ENVIRONMENT: Lives with: lives with their family Lives in: House/apartment Stairs: Yes: Internal: 2 steps; none and External: 2 steps; none Has following equipment at home: Quad cane small base  PATIENT GOALS: less swelling    OBJECTIVE: Note: Objective measures were completed at Evaluation unless otherwise noted.  COGNITION: Overall cognitive status: Within functional limits for tasks assessed   PALPATION: Increased induration greater in LE but noted in thigh area as well   OBSERVATIONS / OTHER ASSESSMENTS: slight redness and thickening of skin   POSTURE: forward head, rounded shoulders and increased kyphosis  All within normal limits:   LYMPHEDEMA ASSESSMENTS:    LE LANDMARK RIGHT eval Right 01/29/24 02/04/24  At groin     30 cm proximal to suprapatella     20 cm proximal to suprapatella   63.7  10  cm proximal to suprapatella   61.9  At midpatella / popliteal crease 52.5 46.3 49.5  30 cm proximal to floor at lateral plantar foot 53.5 45.3 46.8  20 cm proximal to floor at lateral plantar foot 44  36.6 38.7  10 cm proximal to floor at lateral plantar foot 29 27.2 27.7  Circumference of ankle/heel 33.5 33.4 34  5 cm proximal to  1st MTP joint 24 24 23.8  Across MTP joint 23 22.8 23  Around proximal great toe     (Blank rows = not tested)  LE LANDMARK LEFT eval Left 01/29/24 02/04/24/  At groin     30 cm proximal to suprapatella     20 cm proximal to suprapatella   66  10 cm proximal to suprapatella   63  At midpatella / popliteal crease 53 47 48.5  30 cm proximal to floor at lateral plantar foot 54.2 50.2 49.4  20 cm proximal to floor at lateral plantar foot 40.5 40.5 38.5  10 cm proximal to floor at lateral plantar foot 32.3 29.5 30.3  Circumference of ankle/heel 35.4 34 34.7  5 cm proximal to 1st MTP joint 24.3 23.6 23.2  Across MTP joint 23.3 22.5 22.7  Around proximal great toe     (Blank rows = not tested)  FUNCTIONAL TESTS:  Gait : 1:30 minutes to ambulate with quad cane x 80 ft    TODAY'S TREATMENT:                                                                                                                              DATE:  02/04/2024: measurement Lymph drainage for bil LE's  including short neck, deep and superficial abdominal.  Routing bil inguinal-axillary anastomosis and B LE.  Manual completed anteriorly. Compression bandaging from toes to thigh using 1/2 " foam and multilayer short stretch bandaging .  PATIENT EDUCATION:  Education details: skin care recommending using Eucerin or Aquaphor, exercise to include ankle pumps, LAQ, hip ab/adduction, marching and diaphragm breath, what manual will be like as well as multilayer compression dressing.  Person educated: Patient and Spouse Education method: Explanation, Verbal cues, and Handouts Education comprehension: verbalized understanding and returned demonstration  HOME EXERCISE PROGRAM: ankle pumps, LAQ, hip ab/adduction, marching and diaphragm breath  ASSESSMENT:  CLINICAL IMPRESSION: Pt has not been seen for 6 days due to scheduling.  Measurements are lower than at evaluation on 4/22 but higher than on 01/29/24.  Pt has noted  hyperkeratosis, hyperpigmentation  of skin as well as noted papillomatosis on LE.  Pt will benefit from continued skilled therapy for total decongestive techniques as well as a compression pump.         OBJECTIVE IMPAIRMENTS: cardiopulmonary status limiting activity, decreased activity tolerance, decreased mobility, difficulty walking, decreased ROM, decreased strength, increased edema, and obesity.   ACTIVITY LIMITATIONS: carrying, lifting, stairs, bathing, dressing, and locomotion level  PARTICIPATION LIMITATIONS: shopping and community activity  PERSONAL FACTORS:  Age, Fitness, Time since onset of injury/illness/exacerbation, and 1-2 comorbidities: CHF,   are also affecting patient's functional outcome.   REHAB POTENTIAL: Good  CLINICAL DECISION MAKING: Evolving/moderate complexity  EVALUATION COMPLEXITY: Moderate  GOALS: Goals reviewed with patient? Yes  SHORT TERM GOALS: Target date: 02/10/24  PT to be I in HEP to improve lymphatic circulation Baseline: Goal status: met  2.  PT to have lost 1-2 cm in her feet to be able to fit in her shoes, 2-3 cm in legs for improved skin integrity. Baseline:  Goal status: on-going    LONG TERM GOALS: Target date: 03/02/24  PT to have lost 2 -3 cm in her feet, 3-5 cm in her LE for decreased risk of cellulitis.  Baseline:  Goal status: on-going   2.  Pt to have aqcuired juxtafit compression garment and be able to don to be able to continue maintenance phase of treatment.  Baseline:  Goal status: on-going   3.  PT to be I in self manual to assist in increasing lymphatic circulation Baseline:  Goal status: on-going   PLAN:  PT FREQUENCY: 3x/week  PT DURATION: 6 weeks  PLANNED INTERVENTIONS: 97110-Therapeutic exercises, 97535- Self Care, and 16109- Manual therapy  PLAN FOR NEXT SESSION: Continue  total decongestive techniques.  Leodis Rainwater, PT CLT [phone]  [facility] Ph:  [phone]  02/04/2024, 4:31 PM

## 2024-02-06 ENCOUNTER — Ambulatory Visit (HOSPITAL_COMMUNITY): Admitting: Physical Therapy

## 2024-02-06 DIAGNOSIS — I890 Lymphedema, not elsewhere classified: Secondary | ICD-10-CM

## 2024-02-06 NOTE — Therapy (Signed)
 OUTPATIENT PHYSICAL THERAPY LYMPHEDEMA TREATMENT  Patient Name: [name] MRN: [id number] DOB:[birth year], 78 y.o., female Today's Date: 02/06/2024  END OF SESSION:  PT End of Session - 02/06/24 1540     Visit Number 7    Number of Visits 18    Date for PT Re-Evaluation 03/02/24    Authorization Type [employer]    Authorization Time Period 18 visits from 4/24-6/3    Authorization - Visit Number 7    Authorization - Number of Visits 18    Progress Note Due on Visit 10    PT Start Time 1447    PT Stop Time 1552    PT Time Calculation (min) 65 min    Activity Tolerance Patient tolerated treatment well    Behavior During Therapy [facility] for tasks assessed/performed              Past Medical History:  Diagnosis Date   Carpal tunnel syndrome of right wrist 03/2013   recurrent   Chronic diastolic heart failure (HCC)    Cirrhosis, 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 ? to get liver bx if she gets bariatric surgery? Mild portal hypertensive gastropathy on EGD 08/2019.   Fibromyalgia    GAD (generalized anxiety disorder)    GERD    History of hiatal hernia    History of iron  deficiency anemia 12/2018   Inadequate absorption secondary to chronic/long term PPI therapy + portal hypertensive gastroduodenopathy. No GIB found on EGD, colonosc, and givens. Iron  infusions X multiple.   History of thrombocytopenia 12/2011   Hyperlipidemia    Intolerant of statins   HYPERTENSION    IBS (irritable bowel syndrome)    -D.  Good response to bentyl  and imodium  as of 06/2018 GI f/u.   IDDM (insulin  dependent diabetes mellitus)    with DPN (managed by Dr. Aldona Amel but then in 2018 pt preferred to have me manage for her convenience)   Limited mobility    Requires a walker for arthritic pain, widespread musculoskeletal pain, and neuropathic pain.   Morbid obesity (HCC)    As of 11/2018, pt considering sleeve gastectomy vs bipass as of eval by Dr.  Molli Angelucci considering as of 12/2019.   Nonalcoholic steatohepatitis    Viral Hep screens NEG.  CT 2015.  Transaminasemia.  U/S 07/2018 showed early changes of cirrhosis.   OSA (obstructive sleep apnea) 09/14/2015   sleep study 09/07/15: severe obstructive sleep apnea with an AHI of 72 and SaO2 low of 75%.>referred to sleep MD   Osteoarthritis    hips, shoulders, knees   PAF (paroxysmal atrial fibrillation) (HCC)    One documented episode (after getting EGD 2016).  Was on amiodarone  x 3 mo.  Rate control with metoprolol  + anticoag with xarelto . Watchman 12/2022, off anticoag   Portal hypertensive gastropathy (HCC)    hemorrhagic gastropathy + non-bleeding gastric ulcers on EGD 08/2023   Recurrent epistaxis    Granuloma in L nare cauterized by ENT 04/2020. Another cautery 06/2020   Small fiber neuropathy    Due to DM.  Symmetric hands and feet tingling/numbness.   Ulcerative colitis (HCC)    Remicade  infusion Q 8 weeks: in clinical and endoscopic remission as of 12/2018 GI f/u.  06/11/19 rpt colonoscopy->cecal and ascending colon colitis.   Past Surgical History:  Procedure Laterality Date   ABDOMINAL HYSTERECTOMY  1980   Paps no longer indicated.   BACK SURGERY     BACTERIAL OVERGROWTH TEST  N/A 07/13/2015   Procedure: BACTERIAL OVERGROWTH TEST;  Surgeon: Ruby Corporal, MD;  [facility];  Service: Endoscopy;  Laterality: N/A;  730     BILATERAL SALPINGOOPHORECTOMY  02/10/2001   BIOPSY  06/11/2019   Procedure: BIOPSY;  Surgeon: Ruby Corporal, MD;  [facility];  Service: Endoscopy;;  colon   BIOPSY  09/27/2019   Procedure: BIOPSY;  Surgeon: Ruby Corporal, MD;  [facility];  Service: Endoscopy;;  gastric duodenal   BIOPSY  09/15/2020   Procedure: BIOPSY;  Surgeon: Urban Garden, MD;  [facility];  Service: Gastroenterology;;   BIOPSY  09/25/2023   Procedure: BIOPSY;  Surgeon: Urban Garden, MD;  [facility];  Service: Gastroenterology;;   BREAST REDUCTION SURGERY  1994   bilat   BREAST SURGERY     CARDIOVASCULAR STRESS TEST  07/2010   Lexiscan myoview: normal   CARPAL TUNNEL RELEASE Right 1996   CARPAL TUNNEL RELEASE Left 03/21/2003   CARPAL TUNNEL RELEASE Right 05/04/2013   Procedure: CARPAL TUNNEL RELEASE;  Surgeon: Amelie Baize., MD;  Location: [facility];  Service: Orthopedics;  Laterality: Right;   CARPAL TUNNEL RELEASE Left 09/21/2013   Procedure: LEFT CARPAL TUNNEL RELEASE;  Surgeon: Amelie Baize., MD;  Location: [facility];  Service: Orthopedics;  Laterality: Left;   CHOLECYSTECTOMY     COLONOSCOPY WITH PROPOFOL  N/A 08/04/2015   Colitis in remission.  No polyps.  Procedure: COLONOSCOPY WITH PROPOFOL ;  Surgeon: Ruby Corporal, MD;  [facility];  Service: Endoscopy;  Laterality: N/A;  cecum time in  0820   time out  0827    total time 7 minutes   COLONOSCOPY WITH PROPOFOL  N/A 06/11/2019   cecal and ascending colon colitis.  Procedure: COLONOSCOPY WITH PROPOFOL ;  Surgeon: Ruby Corporal, MD;  [facility];  Service: Endoscopy;  Laterality: N/A;  730a   COLONOSCOPY WITH PROPOFOL  N/A 09/15/2020   Procedure: COLONOSCOPY WITH PROPOFOL ;  Surgeon: Urban Garden, MD;  [facility];  Service: Gastroenterology;  Laterality: N/A;  1030   ESOPHAGEAL DILATION N/A 08/04/2015   Procedure: ESOPHAGEAL DILATION;  Surgeon: Ruby Corporal, MD;  [facility];  Service: Endoscopy;  Laterality: N/AFelicita Horns, no mucousal disruption   ESOPHAGOGASTRODUODENOSCOPY  09/27/2019   Performed for IDA.  Esoph dilation was done but no stricture present.  Mild portal hypertensive gastropathy, o/w normal.  Duodenal bx NEG.  h pylori neg.   ESOPHAGOGASTRODUODENOSCOPY     09/25/2023 inactive chronic gastritis, h pylor neg. benign   ESOPHAGOGASTRODUODENOSCOPY     09/25/23 hemorrhagic portal gastropathy + nonbleeding gastric  ulcers (H pylori neg)   ESOPHAGOGASTRODUODENOSCOPY (EGD) WITH ESOPHAGEAL DILATION  12/02/2005   ESOPHAGOGASTRODUODENOSCOPY (EGD) WITH PROPOFOL  N/A 08/04/2015   Procedure: ESOPHAGOGASTRODUODENOSCOPY (EGD) WITH PROPOFOL ;  Surgeon: Ruby Corporal, MD;  [facility];  Service: Endoscopy;  Laterality: N/A;  procedure 1   ESOPHAGOGASTRODUODENOSCOPY (EGD) WITH PROPOFOL  N/A 09/27/2019   Procedure: ESOPHAGOGASTRODUODENOSCOPY (EGD) WITH PROPOFOL ;  Surgeon: Ruby Corporal, MD;  [facility];  Service: Endoscopy;  Laterality: N/A;  12:10   ESOPHAGOGASTRODUODENOSCOPY (EGD) WITH PROPOFOL  N/A 10/31/2021   Procedure: ESOPHAGOGASTRODUODENOSCOPY (EGD) WITH PROPOFOL ;  Surgeon: Ruby Corporal, MD;  [facility];  Service: Endoscopy;  Laterality: N/A;  930   ESOPHAGOGASTRODUODENOSCOPY (EGD) WITH PROPOFOL  N/A 09/25/2023   Procedure: ESOPHAGOGASTRODUODENOSCOPY (EGD) WITH PROPOFOL ;  Surgeon: Sammi Crick  Myrlene Asper, MD;  [facility];  Service: Gastroenterology;  Laterality: N/A;  10:30AM;ASA 3   FLEXIBLE SIGMOIDOSCOPY  01/17/2012   Procedure: FLEXIBLE SIGMOIDOSCOPY;  Surgeon: Ruby Corporal, MD;  [facility];  Service: Endoscopy;  Laterality: N/A;   GIVENS CAPSULE STUDY N/A 08/03/2019   Procedure: GIVENS CAPSULE STUDY (performed for IDA)->some food debris in stomach and small amount of blood.  Surgeon: Ruby Corporal, MD;  [facility];  Service: Endoscopy;  Laterality: N/A;  730AM   HEMILAMINOTOMY LUMBAR SPINE Bilateral 09/07/1999   L4-5   HOT HEMOSTASIS  09/25/2023   Procedure: HOT HEMOSTASIS (ARGON PLASMA COAGULATION/BICAP);  Surgeon: Umberto Ganong, Bearl Limes, MD;  [facility];  Service: Gastroenterology;;   KNEE ARTHROSCOPY Right 01/1999; 10/2000   LEFT ATRIAL APPENDAGE OCCLUSION N/A 01/23/2023   Procedure: LEFT ATRIAL APPENDAGE OCCLUSION;  Surgeon: Arnoldo Lapping, MD;  Location: [facility] CV LAB;  Service: Cardiovascular;   Laterality: N/A;   LEFT ATRIAL APPENDAGE OCCLUSION     LYSIS OF ADHESION  02/10/2001   MALONEY DILATION  09/27/2019   Procedure: MALONEY DILATION;  Surgeon: Ruby Corporal, MD;  [facility];  Service: Endoscopy;;   NASAL ENDOSCOPY WITH EPISTAXIS CONTROL Bilateral 01/25/2022   Procedure: NASAL ENDOSCOPY WITH EPISTAXIS CONTROL;  Surgeon: Ammon Bales, MD;  Location[facility];  Service: ENT;  Laterality: Bilateral;   NASAL SEPTOPLASTY W/ TURBINOPLASTY Bilateral 01/25/2022   Procedure: NASAL SEPTOPLASTY WITH TURBINATE REDUCTION;  Surgeon: Ammon Bales, MD;  Location: [facility];  Service: ENT;  Laterality: Bilateral;   RECTOCELE REPAIR  1990; 09/12/2006   TARSAL TUNNEL RELEASE  2002   TEE WITHOUT CARDIOVERSION N/A 01/23/2023   Procedure: TRANSESOPHAGEAL ECHOCARDIOGRAM;  Surgeon: Arnoldo Lapping, MD;  Location: [facility] INVAS[facility] CV LAB;  Service: Cardiovascular;  Laterality: N/A;   TRANSESOPHAGEAL ECHOCARDIOGRAM (CATH LAB) N/A 08/26/2023   4mm leak around watchman device. Procedure: TRANSESOPHAGEAL ECHOCARDIOGRAM;  Surgeon: Euell Herrlich, MD;  Location: [facility] INVASIVE CV LAB;  Service: Cardiovascular;  Laterality: N/A;   TRANSTHORACIC ECHOCARDIOGRAM  08/04/2015   EF 60-65%, normal wall motion, mild LVH, mild LA dilation, grd I DD.  11/2023 overall poor quality, EF 55-60%   TUMOR EXCISION Left 03/21/2003   dorsal 1st web space (hand)   URETEROLYSIS Right 02/10/2001   Patient Active Problem List   Diagnosis Date Noted   Acute on chronic diastolic CHF (congestive heart failure) (HCC) 10/31/2023   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 08/26/2023   Atrial fibrillation (HCC) 01/23/2023   Gastroenteritis, infectious, presumed 11/14/2022   Infectious colitis 11/14/2022   Acute metabolic encephalopathy 10/09/2022   Back pain 10/09/2022   Diabetes mellitus (HCC) 10/09/2022   Hypoglycemia 10/09/2022   IBS (irritable bowel syndrome)    Chronic ulcerative pancolitis (HCC)  08/21/2021   Diarrhea 10/03/2020   Cirrhosis, nonalcoholic (HCC) 10/03/2020   Fecal urgency 08/02/2020   Deviated septum 07/28/2020   Epistaxis, recurrent 05/15/2020   Ulcerative colitis without complications (HCC) 08/24/2019   Gastrointestinal hemorrhage 08/24/2019   Iron  deficiency anemia due to chronic blood loss 07/28/2019   Iron  deficiency anemia 05/20/2019   Heme positive stool 05/20/2019   History of iron  deficiency anemia 12/2018   Vertigo 12/04/2015   Uncontrolled type 2 diabetes mellitus with peripheral neuropathy 11/14/2015   OSA (obstructive sleep apnea), severe 09/14/2015   B12 deficiency 08/17/2015   Vitamin B6 deficiency 08/17/2015   PAF (paroxysmal atrial fibrillation) (HCC) 08/17/2015   Chronic anticoagulation 08/17/2015  Atrial fibrillation with RVR (HCC) 08/04/2015   Dysphagia    Gastroesophageal reflux disease without esophagitis    Lymphocytosis 07/04/2015   Obesity 05/18/2014   Allergic sinusitis 12/09/2013   Myalgia 04/28/2012   Polyarthralgia 04/28/2012   Elevated LFTs 01/16/2012   Insomnia 01/12/2012   Small fiber neuropathy 05/30/2011   Anxiety 05/30/2011   Dyslipidemia 01/24/2011   PALPITATIONS, RECURRENT 08/08/2010   Essential hypertension 04/12/2010   GERD 04/12/2010   Ulcerative colitis (HCC) 04/12/2010    PCP: Mandy Second, PA  REFERRING PROVIDER: Mandy Second, PA  REFERRING DIAG: I87.2 (ICD-10-CM) - Venous insufficiency of both lower extremities I89.0,E66.9 (ICD-10-CM) - Lymphedema associated with obesity I87.2 (ICD-10-CM) - Venous stasis dermatitis of both lower extremities  THERAPY DIAG:  Lymphedema, not elsewhere classified  Rationale for Evaluation and Treatment: Rehabilitation  ONSET DATE: chronic but has increased significantly in the past year.   SUBJECTIVE:                                                                                                                                                                                            SUBJECTIVE STATEMENT: Pt and husband states that they can really see a difference     PERTINENT HISTORY:  [name] is a 78 y.o. female with past medical history significant for chronic diastolic congestive heart failure, proximal atrial fibrillation not on anticoagulation due to recurrent epistaxis s/p Watchman procedure (12/2022), CAD on aspirin , type 2 diabetes mellitus, anxiety/depression, nonalcoholic cirrhosis, OSA on CPAP, morbid obesity hospitalized for acute CHF from 10/31/23-11/04/2574 .     PAIN:  Are you having pain?   NPRS scale: 0/10  PRECAUTIONS: Other: cellulitis    RED FLAGS: None   WEIGHT BEARING RESTRICTIONS: No  FALLS:  Has patient fallen in last 6 months? No  LIVING ENVIRONMENT: Lives with: lives with their family Lives in: House/apartment Stairs: Yes: Internal: 2 steps; none and External: 2 steps; none Has following equipment at home: Quad cane small base  PATIENT GOALS: less swelling    OBJECTIVE: Note: Objective measures were completed at Evaluation unless otherwise noted.  COGNITION: Overall cognitive status: Within functional limits for tasks assessed   PALPATION: Increased induration greater in LE but noted in thigh area as well   OBSERVATIONS / OTHER ASSESSMENTS: slight redness and thickening of skin   POSTURE: forward head, rounded shoulders and increased kyphosis  All within normal limits:   LYMPHEDEMA ASSESSMENTS:    LE LANDMARK RIGHT eval Right 01/29/24 02/04/24  At groin     30 cm proximal to suprapatella     20 cm proximal to suprapatella  63.7  10 cm proximal to suprapatella   61.9  At midpatella / popliteal crease 52.5 46.3 49.5  30 cm proximal to floor at lateral plantar foot 53.5 45.3 46.8  20 cm proximal to floor at lateral plantar foot 44  36.6 38.7  10 cm proximal to floor at lateral plantar foot 29 27.2 27.7  Circumference of ankle/heel 33.5 33.4 34  5 cm proximal to 1st MTP joint 24 24 23.8   Across MTP joint 23 22.8 23  Around proximal great toe     (Blank rows = not tested)  LE LANDMARK LEFT eval Left 01/29/24 02/04/24/  At groin     30 cm proximal to suprapatella     20 cm proximal to suprapatella   66  10 cm proximal to suprapatella   63  At midpatella / popliteal crease 53 47 48.5  30 cm proximal to floor at lateral plantar foot 54.2 50.2 49.4  20 cm proximal to floor at lateral plantar foot 40.5 40.5 38.5  10 cm proximal to floor at lateral plantar foot 32.3 29.5 30.3  Circumference of ankle/heel 35.4 34 34.7  5 cm proximal to 1st MTP joint 24.3 23.6 23.2  Across MTP joint 23.3 22.5 22.7  Around proximal great toe     (Blank rows = not tested)  FUNCTIONAL TESTS:  Gait : 1:30 minutes to ambulate with quad cane x 80 ft    TODAY'S TREATMENT:                                                                                                                              DATE:  02/05/2024: Lymph drainage for bil LE's  including short neck, deep and superficial abdominal.  Routing bil inguinal-axillary anastomosis and B LE.  Manual completed anteriorly. Compression bandaging from toes to thigh using 1/2 " foam and multilayer short stretch bandaging .  PATIENT EDUCATION:  Education details: skin care recommending using Eucerin or Aquaphor, exercise to include ankle pumps, LAQ, hip ab/adduction, marching and diaphragm breath, what manual will be like as well as multilayer compression dressing.  Person educated: Patient and Spouse Education method: Explanation, Verbal cues, and Handouts Education comprehension: verbalized understanding and returned demonstration  HOME EXERCISE PROGRAM: ankle pumps, LAQ, hip ab/adduction, marching and diaphragm breath  ASSESSMENT:  CLINICAL IMPRESSION: Pt has not been seen for 6 days due to scheduling.  Measurements are lower than at evaluation on 4/22 but higher than on 01/29/24.  Pt has noted hyperkeratosis, hyperpigmentation  of skin as well  as noted papillomatosis on LE.  Pt will benefit from continued skilled therapy for total decongestive techniques as well as a compression pump.         OBJECTIVE IMPAIRMENTS: cardiopulmonary status limiting activity, decreased activity tolerance, decreased mobility, difficulty walking, decreased ROM, decreased strength, increased edema, and obesity.   ACTIVITY LIMITATIONS: carrying, lifting, stairs, bathing, dressing, and locomotion level  PARTICIPATION LIMITATIONS: shopping and community activity  PERSONAL FACTORS: Age, Fitness, Time since onset of injury/illness/exacerbation, and 1-2 comorbidities: CHF,  are also affecting patient's functional outcome.   REHAB POTENTIAL: Good  CLINICAL DECISION MAKING: Evolving/moderate complexity  EVALUATION COMPLEXITY: Moderate  GOALS: Goals reviewed with patient? Yes  SHORT TERM GOALS: Target date: 02/10/24  PT to be I in HEP to improve lymphatic circulation Baseline: Goal status: met  2.  PT to have lost 1-2 cm in her feet to be able to fit in her shoes, 2-3 cm in legs for improved skin integrity. Baseline:  Goal status: on-going    LONG TERM GOALS: Target date: 03/02/24  PT to have lost 2 -3 cm in her feet, 3-5 cm in her LE for decreased risk of cellulitis.  Baseline:  Goal status: on-going   2.  Pt to have aqcuired juxtafit compression garment and be able to don to be able to continue maintenance phase of treatment.  Baseline:  Goal status: on-going   3.  PT to be I in self manual to assist in increasing lymphatic circulation Baseline:  Goal status: on-going   PLAN:  PT FREQUENCY: 3x/week  PT DURATION: 6 weeks  PLANNED INTERVENTIONS: 97110-Therapeutic exercises, 97535- Self Care, and 45409- Manual therapy  PLAN FOR NEXT SESSION: Continue  total decongestive techniques. Measure on Wed.  Leodis Rainwater, PT CLT [phone]  [facility] Ph: [phone]  02/06/2024, 3:41 PM

## 2024-02-09 ENCOUNTER — Other Ambulatory Visit (HOSPITAL_COMMUNITY): Payer: Self-pay

## 2024-02-09 ENCOUNTER — Telehealth: Payer: Self-pay

## 2024-02-09 ENCOUNTER — Telehealth (HOSPITAL_COMMUNITY): Payer: Self-pay | Admitting: Physical Therapy

## 2024-02-09 ENCOUNTER — Encounter (HOSPITAL_COMMUNITY): Admitting: Physical Therapy

## 2024-02-09 NOTE — Telephone Encounter (Signed)
 Pharmacy Patient Advocate Encounter  Received notification from [employer] that Prior Authorization for (TRESIBA  FLEXTOUCH) 100 UNIT/ML FlexTouch Pen  has been APPROVED Unable to obtain price due to refill too soon rejection, last fill date 02/08/2024 next available fill date 04/16/2024  (Key: [id number])

## 2024-02-09 NOTE — Telephone Encounter (Signed)
 Pt did not show for appointment.  Called and unable to leave VM as mailbox was full.  Lorenso Romance, PTA/CLT [facility] Ph: [phone]

## 2024-02-10 ENCOUNTER — Encounter (HOSPITAL_COMMUNITY): Admitting: Physical Therapy

## 2024-02-10 NOTE — Telephone Encounter (Signed)
 Pt advised.

## 2024-02-10 NOTE — Telephone Encounter (Signed)
 Could you please call pt and notify her that her Tresiba  insulin  has been approved by her insurance company and she should be able to fill and start taking it. I would like her to come in for office visit for glucose recheck 1-2 weeks after she starts it. thanks

## 2024-02-11 ENCOUNTER — Ambulatory Visit (HOSPITAL_COMMUNITY): Admitting: Physical Therapy

## 2024-02-11 DIAGNOSIS — I890 Lymphedema, not elsewhere classified: Secondary | ICD-10-CM | POA: Diagnosis not present

## 2024-02-11 NOTE — Therapy (Signed)
 OUTPATIENT PHYSICAL THERAPY LYMPHEDEMA TREATMENT  Patient Name: [name] MRN: [id number] DOB:Aug 08, 1946, 78 y.o., female Today's Date: 02/11/2024  END OF SESSION:  PT End of Session - 02/11/24 1133     Visit Number 8    Number of Visits 18    Date for PT Re-Evaluation 03/02/24    Authorization Type [employer]    Authorization Time Period 18 visits from 4/24-6/3    Authorization - Visit Number 8    Authorization - Number of Visits 18    Progress Note Due on Visit 10    PT Start Time 1012    PT Stop Time 1128    PT Time Calculation (min) 76 min    Activity Tolerance Patient tolerated treatment well    Behavior During Therapy [facility] for tasks assessed/performed              Past Medical History:  Diagnosis Date   Carpal tunnel syndrome of right wrist 03/2013   recurrent   Chronic diastolic heart failure (HCC)    Cirrhosis, 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 ? to get liver bx if she gets bariatric surgery? Mild portal hypertensive gastropathy on EGD 08/2019.   Fibromyalgia    GAD (generalized anxiety disorder)    GERD    History of hiatal hernia    History of iron  deficiency anemia 12/2018   Inadequate absorption secondary to chronic/long term PPI therapy + portal hypertensive gastroduodenopathy. No GIB found on EGD, colonosc, and givens. Iron  infusions X multiple.   History of thrombocytopenia 12/2011   Hyperlipidemia    Intolerant of statins   HYPERTENSION    IBS (irritable bowel syndrome)    -D.  Good response to bentyl  and imodium  as of 06/2018 GI f/u.   IDDM (insulin  dependent diabetes mellitus)    with DPN (managed by Dr. Aldona Amel but then in 2018 pt preferred to have me manage for her convenience)   Limited mobility    Requires a walker for arthritic pain, widespread musculoskeletal pain, and neuropathic pain.   Morbid obesity (HCC)    As of 11/2018, pt considering sleeve gastectomy vs bipass as of eval by Dr.  Molli Angelucci considering as of 12/2019.   Nonalcoholic steatohepatitis    Viral Hep screens NEG.  CT 2015.  Transaminasemia.  U/S 07/2018 showed early changes of cirrhosis.   OSA (obstructive sleep apnea) 09/14/2015   sleep study 09/07/15: severe obstructive sleep apnea with an AHI of 72 and SaO2 low of 75%.>referred to sleep MD   Osteoarthritis    hips, shoulders, knees   PAF (paroxysmal atrial fibrillation) (HCC)    One documented episode (after getting EGD 2016).  Was on amiodarone  x 3 mo.  Rate control with metoprolol  + anticoag with xarelto . Watchman 12/2022, off anticoag   Portal hypertensive gastropathy (HCC)    hemorrhagic gastropathy + non-bleeding gastric ulcers on EGD 08/2023   Recurrent epistaxis    Granuloma in L nare cauterized by ENT 04/2020. Another cautery 06/2020   Small fiber neuropathy    Due to DM.  Symmetric hands and feet tingling/numbness.   Ulcerative colitis (HCC)    Remicade  infusion Q 8 weeks: in clinical and endoscopic remission as of 12/2018 GI f/u.  06/11/19 rpt colonoscopy->cecal and ascending colon colitis.   Past Surgical History:  Procedure Laterality Date   ABDOMINAL HYSTERECTOMY  1980   Paps no longer indicated.   BACK SURGERY     BACTERIAL OVERGROWTH TEST  N/A 07/13/2015   Procedure: BACTERIAL OVERGROWTH TEST;  Surgeon: Ruby Corporal, MD;  [facility];  Service: Endoscopy;  Laterality: N/A;  730     BILATERAL SALPINGOOPHORECTOMY  02/10/2001   BIOPSY  06/11/2019   Procedure: BIOPSY;  Surgeon: Ruby Corporal, MD;  [facility];  Service: Endoscopy;;  colon   BIOPSY  09/27/2019   Procedure: BIOPSY;  Surgeon: Ruby Corporal, MD;  [facility];  Service: Endoscopy;;  gastric duodenal   BIOPSY  09/15/2020   Procedure: BIOPSY;  Surgeon: Urban Garden, MD;  [facility];  Service: Gastroenterology;;   BIOPSY  09/25/2023   Procedure: BIOPSY;  Surgeon: Urban Garden, MD;  [facility];  Service: Gastroenterology;;   BREAST REDUCTION SURGERY  1994   bilat   BREAST SURGERY     CARDIOVASCULAR STRESS TEST  07/2010   Lexiscan myoview: normal   CARPAL TUNNEL RELEASE Right 1996   CARPAL TUNNEL RELEASE Left 03/21/2003   CARPAL TUNNEL RELEASE Right 05/04/2013   Procedure: CARPAL TUNNEL RELEASE;  Surgeon: Amelie Baize., MD;  Location: [facility];  Service: Orthopedics;  Laterality: Right;   CARPAL TUNNEL RELEASE Left 09/21/2013   Procedure: LEFT CARPAL TUNNEL RELEASE;  Surgeon: Amelie Baize., MD;  Location:  SURGERY CENTER;  Service: Orthopedics;  Laterality: Left;   CHOLECYSTECTOMY     COLONOSCOPY WITH PROPOFOL  N/A 08/04/2015   Colitis in remission.  No polyps.  Procedure: COLONOSCOPY WITH PROPOFOL ;  Surgeon: Ruby Corporal, MD;  [facility]: [facility];  Service: Endoscopy;  Laterality: N/A;  cecum time in  0820   time out  0827    total time 7 minutes   COLONOSCOPY WITH PROPOFOL  N/A 06/11/2019   cecal and ascending colon colitis.  Procedure: COLONOSCOPY WITH PROPOFOL ;  Surgeon: Ruby Corporal, MD;  [facility];  Service: Endoscopy;  Laterality: N/A;  730a   COLONOSCOPY WITH PROPOFOL  N/A 09/15/2020   Procedure: COLONOSCOPY WITH PROPOFOL ;  Surgeon: Urban Garden, MD;  [facility];  Service: Gastroenterology;  Laterality: N/A;  1030   ESOPHAGEAL DILATION N/A 08/04/2015   Procedure: ESOPHAGEAL DILATION;  Surgeon: Ruby Corporal, MD;  [facility];  Service: Endoscopy;  Laterality: N/AFelicita Horns, no mucousal disruption   ESOPHAGOGASTRODUODENOSCOPY  09/27/2019   Performed for IDA.  Esoph dilation was done but no stricture present.  Mild portal hypertensive gastropathy, o/w normal.  Duodenal bx NEG.  h pylori neg.   ESOPHAGOGASTRODUODENOSCOPY     09/25/2023 inactive chronic gastritis, h pylor neg. benign   ESOPHAGOGASTRODUODENOSCOPY     09/25/23 hemorrhagic portal gastropathy + nonbleeding gastric  ulcers (H pylori neg)   ESOPHAGOGASTRODUODENOSCOPY (EGD) WITH ESOPHAGEAL DILATION  12/02/2005   ESOPHAGOGASTRODUODENOSCOPY (EGD) WITH PROPOFOL  N/A 08/04/2015   Procedure: ESOPHAGOGASTRODUODENOSCOPY (EGD) WITH PROPOFOL ;  Surgeon: Ruby Corporal, MD;  [facility];  Service: Endoscopy;  Laterality: N/A;  procedure 1   ESOPHAGOGASTRODUODENOSCOPY (EGD) WITH PROPOFOL  N/A 09/27/2019   Procedure: ESOPHAGOGASTRODUODENOSCOPY (EGD) WITH PROPOFOL ;  Surgeon: Ruby Corporal, MD;  [facility];  Service: Endoscopy;  Laterality: N/A;  12:10   ESOPHAGOGASTRODUODENOSCOPY (EGD) WITH PROPOFOL  N/A 10/31/2021   Procedure: ESOPHAGOGASTRODUODENOSCOPY (EGD) WITH PROPOFOL ;  Surgeon: Ruby Corporal, MD;  [facility];  Service: Endoscopy;  Laterality: N/A;  930   ESOPHAGOGASTRODUODENOSCOPY (EGD) WITH PROPOFOL  N/A 09/25/2023   Procedure: ESOPHAGOGASTRODUODENOSCOPY (EGD) WITH PROPOFOL ;  Surgeon: Sammi Crick  Myrlene Asper, MD;  [facility];  Service: Gastroenterology;  Laterality: N/A;  10:30AM;ASA 3   FLEXIBLE SIGMOIDOSCOPY  01/17/2012   Procedure: FLEXIBLE SIGMOIDOSCOPY;  Surgeon: Ruby Corporal, MD;  [facility];  Service: Endoscopy;  Laterality: N/A;   GIVENS CAPSULE STUDY N/A 08/03/2019   Procedure: GIVENS CAPSULE STUDY (performed for IDA)->some food debris in stomach and small amount of blood.  Surgeon: Ruby Corporal, MD;  [facility];  Service: Endoscopy;  Laterality: N/A;  730AM   HEMILAMINOTOMY LUMBAR SPINE Bilateral 09/07/1999   L4-5   HOT HEMOSTASIS  09/25/2023   Procedure: HOT HEMOSTASIS (ARGON PLASMA COAGULATION/BICAP);  Surgeon: Umberto Ganong, Bearl Limes, MD;  [facility];  Service: Gastroenterology;;   KNEE ARTHROSCOPY Right 01/1999; 10/2000   LEFT ATRIAL APPENDAGE OCCLUSION N/A 01/23/2023   Procedure: LEFT ATRIAL APPENDAGE OCCLUSION;  Surgeon: Arnoldo Lapping, MD;  Location: [facility]AS[facility] CV LAB;  Service: Cardiovascular;   Laterality: N/A;   LEFT ATRIAL APPENDAGE OCCLUSION     LYSIS OF ADHESION  02/10/2001   MALONEY DILATION  09/27/2019   Procedure: MALONEY DILATION;  Surgeon: Ruby Corporal, MD;  [facility];  Service: Endoscopy;;   NASAL ENDOSCOPY WITH EPISTAXIS CONTROL Bilateral 01/25/2022   Procedure: NASAL ENDOSCOPY WITH EPISTAXIS CONTROL;  Surgeon: Ammon Bales, MD;  Location: [facility];  Service: ENT;  Laterality: Bilateral;   NASAL SEPTOPLASTY W/ TURBINOPLASTY Bilateral 01/25/2022   Procedure: NASAL SEPTOPLASTY WITH TURBINATE REDUCTION;  Surgeon: Ammon Bales, MD;  Location: [facility] OR;  Service: ENT;  Laterality: Bilateral;   RECTOCELE REPAIR  1990; 09/12/2006   TARSAL TUNNEL RELEASE  2002   TEE WITHOUT CARDIOVERSION N/A 01/23/2023   Procedure: TRANSESOPHAGEAL ECHOCARDIOGRAM;  Surgeon: Arnoldo Lapping, MD;  Location: [facility] INVASIVE CV LAB;  Service: Cardiovascular;  Laterality: N/A;   TRANSESOPHAGEAL ECHOCARDIOGRAM (CATH LAB) N/A 08/26/2023   4mm leak around watchman device. Procedure: TRANSESOPHAGEAL ECHOCARDIOGRAM;  Surgeon: Euell Herrlich, MD;  Location: [facility] INVASIVE CV LAB;  Service: Cardiovascular;  Laterality: N/A;   TRANSTHORACIC ECHOCARDIOGRAM  08/04/2015   EF 60-65%, normal wall motion, mild LVH, mild LA dilation, grd I DD.  11/2023 overall poor quality, EF 55-60%   TUMOR EXCISION Left 03/21/2003   dorsal 1st web space (hand)   URETEROLYSIS Right 02/10/2001   Patient Active Problem List   Diagnosis Date Noted   Acute on chronic diastolic CHF (congestive heart failure) (HCC) 10/31/2023   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 08/26/2023   Atrial fibrillation (HCC) 01/23/2023   Gastroenteritis, infectious, presumed 11/14/2022   Infectious colitis 11/14/2022   Acute metabolic encephalopathy 10/09/2022   Back pain 10/09/2022   Diabetes mellitus (HCC) 10/09/2022   Hypoglycemia 10/09/2022   IBS (irritable bowel syndrome)    Chronic ulcerative pancolitis (HCC)  08/21/2021   Diarrhea 10/03/2020   Cirrhosis, nonalcoholic (HCC) 10/03/2020   Fecal urgency 08/02/2020   Deviated septum 07/28/2020   Epistaxis, recurrent 05/15/2020   Ulcerative colitis without complications (HCC) 08/24/2019   Gastrointestinal hemorrhage 08/24/2019   Iron  deficiency anemia due to chronic blood loss 07/28/2019   Iron  deficiency anemia 05/20/2019   Heme positive stool 05/20/2019   History of iron  deficiency anemia 12/2018   Vertigo 12/04/2015   Uncontrolled type 2 diabetes mellitus with peripheral neuropathy 11/14/2015   OSA (obstructive sleep apnea), severe 09/14/2015   B12 deficiency 08/17/2015   Vitamin B6 deficiency 08/17/2015   PAF (paroxysmal atrial fibrillation) (HCC) 08/17/2015   Chronic anticoagulation 08/17/2015  Atrial fibrillation with RVR (HCC) 08/04/2015   Dysphagia    Gastroesophageal reflux disease without esophagitis    Lymphocytosis 07/04/2015   Obesity 05/18/2014   Allergic sinusitis 12/09/2013   Myalgia 04/28/2012   Polyarthralgia 04/28/2012   Elevated LFTs 01/16/2012   Insomnia 01/12/2012   Small fiber neuropathy 05/30/2011   Anxiety 05/30/2011   Dyslipidemia 01/24/2011   PALPITATIONS, RECURRENT 08/08/2010   Essential hypertension 04/12/2010   GERD 04/12/2010   Ulcerative colitis (HCC) 04/12/2010    PCP: Mandy Second, PA  REFERRING PROVIDER: Mandy Second, PA  REFERRING DIAG: I87.2 (ICD-10-CM) - Venous insufficiency of both lower extremities I89.0,E66.9 (ICD-10-CM) - Lymphedema associated with obesity I87.2 (ICD-10-CM) - Venous stasis dermatitis of both lower extremities  THERAPY DIAG:  Lymphedema, not elsewhere classified  Rationale for Evaluation and Treatment: Rehabilitation  ONSET DATE: chronic but has increased significantly in the past year.   SUBJECTIVE:                                                                                                                                                                                            SUBJECTIVE STATEMENT: Pt missed her Monday appointment as they were going by their first schedule who had them at Tuesday/Thursday.  PT notes that she can move better.     PERTINENT HISTORY:  KENNIA SCHLOTTERBECK is a 78 y.o. female with past medical history significant for chronic diastolic congestive heart failure, proximal atrial fibrillation not on anticoagulation due to recurrent epistaxis s/p Watchman procedure (12/2022), CAD on aspirin , type 2 diabetes mellitus, anxiety/depression, nonalcoholic cirrhosis, OSA on CPAP, morbid obesity hospitalized for acute CHF from 10/31/23-11/04/2574 .     PAIN:  Are you having pain?   NPRS scale: 0/10  PRECAUTIONS: Other: cellulitis    RED FLAGS: None   WEIGHT BEARING RESTRICTIONS: No  FALLS:  Has patient fallen in last 6 months? No  LIVING ENVIRONMENT: Lives with: lives with their family Lives in: House/apartment Stairs: Yes: Internal: 2 steps; none and External: 2 steps; none Has following equipment at home: Quad cane small base  PATIENT GOALS: less swelling    OBJECTIVE: Note: Objective measures were completed at Evaluation unless otherwise noted.  COGNITION: Overall cognitive status: Within functional limits for tasks assessed   PALPATION: Increased induration greater in LE but noted in thigh area as well   OBSERVATIONS / OTHER ASSESSMENTS: slight redness and thickening of skin   POSTURE: forward head, rounded shoulders and increased kyphosis  All within normal limits:   LYMPHEDEMA ASSESSMENTS:    LE LANDMARK RIGHT eval Right 01/29/24 02/04/24  At groin  30 cm proximal to suprapatella     20 cm proximal to suprapatella   63.7  10 cm proximal to suprapatella   61.9  At midpatella / popliteal crease 52.5 46.3 49.5  30 cm proximal to floor at lateral plantar foot 53.5 45.3 46.8  20 cm proximal to floor at lateral plantar foot 44  36.6 38.7  10 cm proximal to floor at lateral plantar foot 29 27.2 27.7   Circumference of ankle/heel 33.5 33.4 34  5 cm proximal to 1st MTP joint 24 24 23.8  Across MTP joint 23 22.8 23  Around proximal great toe     (Blank rows = not tested)  LE LANDMARK LEFT eval Left 01/29/24 02/04/24/  At groin     30 cm proximal to suprapatella     20 cm proximal to suprapatella   66  10 cm proximal to suprapatella   63  At midpatella / popliteal crease 53 47 48.5  30 cm proximal to floor at lateral plantar foot 54.2 50.2 49.4  20 cm proximal to floor at lateral plantar foot 40.5 40.5 38.5  10 cm proximal to floor at lateral plantar foot 32.3 29.5 30.3  Circumference of ankle/heel 35.4 34 34.7  5 cm proximal to 1st MTP joint 24.3 23.6 23.2  Across MTP joint 23.3 22.5 22.7  Around proximal great toe     (Blank rows = not tested)  FUNCTIONAL TESTS:  Gait : 1:30 minutes to ambulate with quad cane x 80 ft    TODAY'S TREATMENT:                                                                                                                              DATE:  02/10/2024:Measurement Lymph drainage for bil LE's  including short neck, deep and superficial abdominal.  Routing bil inguinal-axillary anastomosis and B LE.  Manual completed anteriorly. Compression bandaging from toes to thigh using 1/2 " foam and multilayer short stretch bandaging .  PATIENT EDUCATION:  Education details: skin care recommending using Eucerin or Aquaphor, exercise to include ankle pumps, LAQ, hip ab/adduction, marching and diaphragm breath, what manual will be like as well as multilayer compression dressing.  Person educated: Patient and Spouse Education method: Explanation, Verbal cues, and Handouts Education comprehension: verbalized understanding and returned demonstration  HOME EXERCISE PROGRAM: ankle pumps, LAQ, hip ab/adduction, marching and diaphragm breath  ASSESSMENT:  CLINICAL IMPRESSION: PT has not had bandaging on since Monday as they thought that they had a Tuesday morning  appointment.  The clinic had to be closed Tuesday due to no water .  Pt states she is up since she took her bandages off, even so, pt measurements are down from last measurement.  Therapist notes pt is raising her own leg onto the mat and able to come supine to sit with much less assistance.    Pt has noted hyperkeratosis, hyperpigmentation  of skin as well as noted papillomatosis on LE.  Pt will benefit from continued skilled therapy for total decongestive techniques as well as a compression pump.         OBJECTIVE IMPAIRMENTS: cardiopulmonary status limiting activity, decreased activity tolerance, decreased mobility, difficulty walking, decreased ROM, decreased strength, increased edema, and obesity.   ACTIVITY LIMITATIONS: carrying, lifting, stairs, bathing, dressing, and locomotion level  PARTICIPATION LIMITATIONS: shopping and community activity  PERSONAL FACTORS: Age, Fitness, Time since onset of injury/illness/exacerbation, and 1-2 comorbidities: CHF,  are also affecting patient's functional outcome.   REHAB POTENTIAL: Good  CLINICAL DECISION MAKING: Evolving/moderate complexity  EVALUATION COMPLEXITY: Moderate  GOALS: Goals reviewed with patient? Yes  SHORT TERM GOALS: Target date: 02/10/24  PT to be I in HEP to improve lymphatic circulation Baseline: Goal status: met  2.  PT to have lost 1-2 cm in her feet to be able to fit in her shoes, 2-3 cm in legs for improved skin integrity. Baseline:  Goal status: on-going    LONG TERM GOALS: Target date: 03/02/24  PT to have lost 2 -3 cm in her feet, 3-5 cm in her LE for decreased risk of cellulitis.  Baseline:  Goal status: on-going   2.  Pt to have aqcuired juxtafit compression garment and be able to don to be able to continue maintenance phase of treatment.  Baseline:  Goal status: on-going   3.  PT to be I in self manual to assist in increasing lymphatic circulation Baseline:  Goal status: on-going   PLAN:  PT  FREQUENCY: 3x/week  PT DURATION: 6 weeks  PLANNED INTERVENTIONS: 97110-Therapeutic exercises, 97535- Self Care, and 95621- Manual therapy  PLAN FOR NEXT SESSION: Continue  total decongestive techniques. Measure on Wed.  Leodis Rainwater, PT CLT [phone]  [facility] Ph: [phone]

## 2024-02-12 ENCOUNTER — Encounter (HOSPITAL_COMMUNITY): Admitting: Physical Therapy

## 2024-02-13 ENCOUNTER — Ambulatory Visit (HOSPITAL_COMMUNITY): Admitting: Physical Therapy

## 2024-02-13 DIAGNOSIS — I890 Lymphedema, not elsewhere classified: Secondary | ICD-10-CM

## 2024-02-13 NOTE — Therapy (Signed)
 OUTPATIENT PHYSICAL THERAPY LYMPHEDEMA TREATMENT  Patient Name: [name] MRN: [id number] DOB:11-05-1945, 78 y.o., female Today's Date: 02/13/2024  END OF SESSION:  PT End of Session - 02/13/24 1018     Visit Number 9    Number of Visits 18    Date for PT Re-Evaluation 03/02/24    Authorization Type [employer]    Authorization Time Period 18 visits from 4/24-6/3    Authorization - Visit Number 9    Authorization - Number of Visits 18    Progress Note Due on Visit 10    PT Start Time [phone]    PT Stop Time 1018    PT Time Calculation (min) 40 min    Activity Tolerance Patient tolerated treatment well    Behavior During Therapy [facility] for tasks assessed/performed               Past Medical History:  Diagnosis Date   Carpal tunnel syndrome of right wrist 03/2013   recurrent   Chronic diastolic heart failure (HCC)    Cirrhosis, 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 ? to get liver bx if she gets bariatric surgery? Mild portal hypertensive gastropathy on EGD 08/2019.   Fibromyalgia    GAD (generalized anxiety disorder)    GERD    History of hiatal hernia    History of iron  deficiency anemia 12/2018   Inadequate absorption secondary to chronic/long term PPI therapy + portal hypertensive gastroduodenopathy. No GIB found on EGD, colonosc, and givens. Iron  infusions X multiple.   History of thrombocytopenia 12/2011   Hyperlipidemia    Intolerant of statins   HYPERTENSION    IBS (irritable bowel syndrome)    -D.  Good response to bentyl  and imodium  as of 06/2018 GI f/u.   IDDM (insulin  dependent diabetes mellitus)    with DPN (managed by Dr. Aldona Amel but then in 2018 pt preferred to have me manage for her convenience)   Limited mobility    Requires a walker for arthritic pain, widespread musculoskeletal pain, and neuropathic pain.   Morbid obesity (HCC)    As of 11/2018, pt considering sleeve gastectomy vs bipass as of eval by  Dr. Molli Angelucci considering as of 12/2019.   Nonalcoholic steatohepatitis    Viral Hep screens NEG.  CT 2015.  Transaminasemia.  U/S 07/2018 showed early changes of cirrhosis.   OSA (obstructive sleep apnea) 09/14/2015   sleep study 09/07/15: severe obstructive sleep apnea with an AHI of 72 and SaO2 low of 75%.>referred to sleep MD   Osteoarthritis    hips, shoulders, knees   PAF (paroxysmal atrial fibrillation) (HCC)    One documented episode (after getting EGD 2016).  Was on amiodarone  x 3 mo.  Rate control with metoprolol  + anticoag with xarelto . Watchman 12/2022, off anticoag   Portal hypertensive gastropathy (HCC)    hemorrhagic gastropathy + non-bleeding gastric ulcers on EGD 08/2023   Recurrent epistaxis    Granuloma in L nare cauterized by ENT 04/2020. Another cautery 06/2020   Small fiber neuropathy    Due to DM.  Symmetric hands and feet tingling/numbness.   Ulcerative colitis (HCC)    Remicade  infusion Q 8 weeks: in clinical and endoscopic remission as of 12/2018 GI f/u.  06/11/19 rpt colonoscopy->cecal and ascending colon colitis.   Past Surgical History:  Procedure Laterality Date   ABDOMINAL HYSTERECTOMY  1980   Paps no longer indicated.   BACK SURGERY     BACTERIAL OVERGROWTH  TEST N/A 07/13/2015   Procedure: BACTERIAL OVERGROWTH TEST;  Surgeon: Ruby Corporal, MD;  [facility];  Service: Endoscopy;  Laterality: N/A;  730     BILATERAL SALPINGOOPHORECTOMY  02/10/2001   BIOPSY  06/11/2019   Procedure: BIOPSY;  Surgeon: Ruby Corporal, MD;  [facility];  Service: Endoscopy;;  colon   BIOPSY  09/27/2019   Procedure: BIOPSY;  Surgeon: Ruby Corporal, MD;  [facility];  Service: Endoscopy;;  gastric duodenal   BIOPSY  09/15/2020   Procedure: BIOPSY;  Surgeon: Urban Garden, MD;  [facility];  Service: Gastroenterology;;   BIOPSY  09/25/2023   Procedure: BIOPSY;  Surgeon: Urban Garden, MD;  [facility];  Service: Gastroenterology;;   BREAST REDUCTION SURGERY  1994   bilat   BREAST SURGERY     CARDIOVASCULAR STRESS TEST  07/2010   Lexiscan myoview: normal   CARPAL TUNNEL RELEASE Right 1996   CARPAL TUNNEL RELEASE Left 03/21/2003   CARPAL TUNNEL RELEASE Right 05/04/2013   Procedure: CARPAL TUNNEL RELEASE;  Surgeon: Amelie Baize., MD;  Location: [facility];  Service: Orthopedics;  Laterality: Right;   CARPAL TUNNEL RELEASE Left 09/21/2013   Procedure: LEFT CARPAL TUNNEL RELEASE;  Surgeon: Amelie Baize., MD;  Location: [facility];  Service: Orthopedics;  Laterality: Left;   CHOLECYSTECTOMY     COLONOSCOPY WITH PROPOFOL  N/A 08/04/2015   Colitis in remission.  No polyps.  Procedure: COLONOSCOPY WITH PROPOFOL ;  Surgeon: Ruby Corporal, MD;  [facility];  Service: Endoscopy;  Laterality: N/A;  cecum time in  0820   time out  0827    total time 7 minutes   COLONOSCOPY WITH PROPOFOL  N/A 06/11/2019   cecal and ascending colon colitis.  Procedure: COLONOSCOPY WITH PROPOFOL ;  Surgeon: Ruby Corporal, MD;  [facility];  Service: Endoscopy;  Laterality: N/A;  730a   COLONOSCOPY WITH PROPOFOL  N/A 09/15/2020   Procedure: COLONOSCOPY WITH PROPOFOL ;  Surgeon: Urban Garden, MD;  [facility];  Service: Gastroenterology;  Laterality: N/A;  1030   ESOPHAGEAL DILATION N/A 08/04/2015   Procedure: ESOPHAGEAL DILATION;  Surgeon: Ruby Corporal, MD;  [facility];  Service: Endoscopy;  Laterality: N/AFelicita Horns, no mucousal disruption   ESOPHAGOGASTRODUODENOSCOPY  09/27/2019   Performed for IDA.  Esoph dilation was done but no stricture present.  Mild portal hypertensive gastropathy, o/w normal.  Duodenal bx NEG.  h pylori neg.   ESOPHAGOGASTRODUODENOSCOPY     09/25/2023 inactive chronic gastritis, h pylor neg. benign   ESOPHAGOGASTRODUODENOSCOPY     09/25/23 hemorrhagic portal gastropathy + nonbleeding  gastric ulcers (H pylori neg)   ESOPHAGOGASTRODUODENOSCOPY (EGD) WITH ESOPHAGEAL DILATION  12/02/2005   ESOPHAGOGASTRODUODENOSCOPY (EGD) WITH PROPOFOL  N/A 08/04/2015   Procedure: ESOPHAGOGASTRODUODENOSCOPY (EGD) WITH PROPOFOL ;  Surgeon: Ruby Corporal, MD;  [facility];  Service: Endoscopy;  Laterality: N/A;  procedure 1   ESOPHAGOGASTRODUODENOSCOPY (EGD) WITH PROPOFOL  N/A 09/27/2019   Procedure: ESOPHAGOGASTRODUODENOSCOPY (EGD) WITH PROPOFOL ;  Surgeon: Ruby Corporal, MD;  [facility];  Service: Endoscopy;  Laterality: N/A;  12:10   ESOPHAGOGASTRODUODENOSCOPY (EGD) WITH PROPOFOL  N/A 10/31/2021   Procedure: ESOPHAGOGASTRODUODENOSCOPY (EGD) WITH PROPOFOL ;  Surgeon: Ruby Corporal, MD;  [facility];  Service: Endoscopy;  Laterality: N/A;  930   ESOPHAGOGASTRODUODENOSCOPY (EGD) WITH PROPOFOL  N/A 09/25/2023   Procedure: ESOPHAGOGASTRODUODENOSCOPY (EGD) WITH PROPOFOL ;  Surgeon:  Urban Garden, MD;  [facility];  Service: Gastroenterology;  Laterality: N/A;  10:30AM;ASA 3   FLEXIBLE SIGMOIDOSCOPY  01/17/2012   Procedure: FLEXIBLE SIGMOIDOSCOPY;  Surgeon: Ruby Corporal, MD;  [facility];  Service: Endoscopy;  Laterality: N/A;   GIVENS CAPSULE STUDY N/A 08/03/2019   Procedure: GIVENS CAPSULE STUDY (performed for IDA)->some food debris in stomach and small amount of blood.  Surgeon: Ruby Corporal, MD;  [facility];  Service: Endoscopy;  Laterality: N/A;  730AM   HEMILAMINOTOMY LUMBAR SPINE Bilateral 09/07/1999   L4-5   HOT HEMOSTASIS  09/25/2023   Procedure: HOT HEMOSTASIS (ARGON PLASMA COAGULATION/BICAP);  Surgeon: Umberto Ganong, Bearl Limes, MD;  [facility];  Service: Gastroenterology;;   KNEE ARTHROSCOPY Right 01/1999; 10/2000   LEFT ATRIAL APPENDAGE OCCLUSION N/A 01/23/2023   Procedure: LEFT ATRIAL APPENDAGE OCCLUSION;  Surgeon: Arnoldo Lapping, MD;  Location: [facility];  Service: Cardiovascular;   Laterality: N/A;   LEFT ATRIAL APPENDAGE OCCLUSION     LYSIS OF ADHESION  02/10/2001   MALONEY DILATION  09/27/2019   Procedure: MALONEY DILATION;  Surgeon: Ruby Corporal, MD;  [facility];  Service: Endoscopy;;   NASAL ENDOSCOPY WITH EPISTAXIS CONTROL Bilateral 01/25/2022   Procedure: NASAL ENDOSCOPY WITH EPISTAXIS CONTROL;  Surgeon: Ammon Bales, MD;  Location: [facility];  Service: ENT;  Laterality: Bilateral;   NASAL SEPTOPLASTY W/ TURBINOPLASTY Bilateral 01/25/2022   Procedure: NASAL SEPTOPLASTY WITH TURBINATE REDUCTION;  Surgeon: Ammon Bales, MD;  Location: [facility] OR;  Service: ENT;  Laterality: Bilateral;   RECTOCELE REPAIR  1990; 09/12/2006   TARSAL TUNNEL RELEASE  2002   TEE WITHOUT CARDIOVERSION N/A 01/23/2023   Procedure: TRANSESOPHAGEAL ECHOCARDIOGRAM;  Surgeon: Arnoldo Lapping, MD;  Location: [facility]AS[facility] CV LAB;  Service: Cardiovascular;  Laterality: N/A;   TRANSESOPHAGEAL ECHOCARDIOGRAM (CATH LAB) N/A 08/26/2023   4mm leak around watchman device. Procedure: TRANSESOPHAGEAL ECHOCARDIOGRAM;  Surgeon: Euell Herrlich, MD;  Location: [facility] CV LAB;  Service: Cardiovascular;  Laterality: N/A;   TRANSTHORACIC ECHOCARDIOGRAM  08/04/2015   EF 60-65%, normal wall motion, mild LVH, mild LA dilation, grd I DD.  11/2023 overall poor quality, EF 55-60%   TUMOR EXCISION Left 03/21/2003   dorsal 1st web space (hand)   URETEROLYSIS Right 02/10/2001   Patient Active Problem List   Diagnosis Date Noted   Acute on chronic diastolic CHF (congestive heart failure) (HCC) 10/31/2023   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 08/26/2023   Atrial fibrillation (HCC) 01/23/2023   Gastroenteritis, infectious, presumed 11/14/2022   Infectious colitis 11/14/2022   Acute metabolic encephalopathy 10/09/2022   Back pain 10/09/2022   Diabetes mellitus (HCC) 10/09/2022   Hypoglycemia 10/09/2022   IBS (irritable bowel syndrome)    Chronic ulcerative pancolitis (HCC)  08/21/2021   Diarrhea 10/03/2020   Cirrhosis, nonalcoholic (HCC) 10/03/2020   Fecal urgency 08/02/2020   Deviated septum 07/28/2020   Epistaxis, recurrent 05/15/2020   Ulcerative colitis without complications (HCC) 08/24/2019   Gastrointestinal hemorrhage 08/24/2019   Iron  deficiency anemia due to chronic blood loss 07/28/2019   Iron  deficiency anemia 05/20/2019   Heme positive stool 05/20/2019   History of iron  deficiency anemia 12/2018   Vertigo 12/04/2015   Uncontrolled type 2 diabetes mellitus with peripheral neuropathy 11/14/2015   OSA (obstructive sleep apnea), severe 09/14/2015   B12 deficiency 08/17/2015   Vitamin B6 deficiency 08/17/2015   PAF (paroxysmal atrial fibrillation) (HCC) 08/17/2015   Chronic anticoagulation 08/17/2015  Atrial fibrillation with RVR (HCC) 08/04/2015   Dysphagia    Gastroesophageal reflux disease without esophagitis    Lymphocytosis 07/04/2015   Obesity 05/18/2014   Allergic sinusitis 12/09/2013   Myalgia 04/28/2012   Polyarthralgia 04/28/2012   Elevated LFTs 01/16/2012   Insomnia 01/12/2012   Small fiber neuropathy 05/30/2011   Anxiety 05/30/2011   Dyslipidemia 01/24/2011   PALPITATIONS, RECURRENT 08/08/2010   Essential hypertension 04/12/2010   GERD 04/12/2010   Ulcerative colitis (HCC) 04/12/2010    PCP: Mandy Second, PA  REFERRING PROVIDER: Mandy Second, PA  REFERRING DIAG: I87.2 (ICD-10-CM) - Venous insufficiency of both lower extremities I89.0,E66.9 (ICD-10-CM) - Lymphedema associated with obesity I87.2 (ICD-10-CM) - Venous stasis dermatitis of both lower extremities  THERAPY DIAG:  Lymphedema, not elsewhere classified  Rationale for Evaluation and Treatment: Rehabilitation  ONSET DATE: chronic but has increased significantly in the past year.   SUBJECTIVE:                                                                                                                                                                                            SUBJECTIVE STATEMENT: Pt overslept and was late for appointment.  Pt able to keep thigh bandages on until last night notes improved mobility  PERTINENT HISTORY:  [name] is a 78 y.o. female with past medical history significant for chronic diastolic congestive heart failure, proximal atrial fibrillation not on anticoagulation due to recurrent epistaxis s/p Watchman procedure (12/2022), CAD on aspirin , type 2 diabetes mellitus, anxiety/depression, nonalcoholic cirrhosis, OSA on CPAP, morbid obesity hospitalized for acute CHF from 10/31/23-11/04/2574 .     PAIN:  Are you having pain?   NPRS scale: 0/10  PRECAUTIONS: Other: cellulitis    RED FLAGS: None   WEIGHT BEARING RESTRICTIONS: No  FALLS:  Has patient fallen in last 6 months? No  LIVING ENVIRONMENT: Lives with: lives with their family Lives in: House/apartment Stairs: Yes: Internal: 2 steps; none and External: 2 steps; none Has following equipment at home: Quad cane small base  PATIENT GOALS: less swelling    OBJECTIVE: Note: Objective measures were completed at Evaluation unless otherwise noted.  COGNITION: Overall cognitive status: Within functional limits for tasks assessed   PALPATION: Increased induration greater in LE but noted in thigh area as well   OBSERVATIONS / OTHER ASSESSMENTS: slight redness and thickening of skin   POSTURE: forward head, rounded shoulders and increased kyphosis  All within normal limits:   LYMPHEDEMA ASSESSMENTS:    LE LANDMARK RIGHT eval Right 01/29/24 02/04/24  At groin     30 cm proximal to suprapatella  20 cm proximal to suprapatella   63.7  10 cm proximal to suprapatella   61.9  At midpatella / popliteal crease 52.5 46.3 49.5  30 cm proximal to floor at lateral plantar foot 53.5 45.3 46.8  20 cm proximal to floor at lateral plantar foot 44  36.6 38.7  10 cm proximal to floor at lateral plantar foot 29 27.2 27.7  Circumference of ankle/heel 33.5  33.4 34  5 cm proximal to 1st MTP joint 24 24 23.8  Across MTP joint 23 22.8 23  Around proximal great toe     (Blank rows = not tested)  LE LANDMARK LEFT eval Left 01/29/24 02/04/24/  At groin     30 cm proximal to suprapatella     20 cm proximal to suprapatella   66  10 cm proximal to suprapatella   63  At midpatella / popliteal crease 53 47 48.5  30 cm proximal to floor at lateral plantar foot 54.2 50.2 49.4  20 cm proximal to floor at lateral plantar foot 40.5 40.5 38.5  10 cm proximal to floor at lateral plantar foot 32.3 29.5 30.3  Circumference of ankle/heel 35.4 34 34.7  5 cm proximal to 1st MTP joint 24.3 23.6 23.2  Across MTP joint 23.3 22.5 22.7  Around proximal great toe     (Blank rows = not tested)  FUNCTIONAL TESTS:  Gait : 1:30 minutes to ambulate with quad cane x 80 ft    TODAY'S TREATMENT:                                                                                                                              DATE:  02/10/2024:Compression bandaging from toes to thigh using 1/2 " foam and multilayer short stretch bandaging .  PATIENT EDUCATION:  Education details: skin care recommending using Eucerin or Aquaphor, exercise to include ankle pumps, LAQ, hip ab/adduction, marching and diaphragm breath, what manual will be like as well as multilayer compression dressing.  Person educated: Patient and Spouse Education method: Explanation, Verbal cues, and Handouts Education comprehension: verbalized understanding and returned demonstration  HOME EXERCISE PROGRAM: ankle pumps, LAQ, hip ab/adduction, marching and diaphragm breath  ASSESSMENT:  CLINICAL IMPRESSION: Pt with improved ability coming sit to stand.  Pt has decreased induration in thigh area.     Pt has noted hyperkeratosis, hyperpigmentation  of skin as well as noted papillomatosis on LE.  Pt will benefit from continued skilled therapy for total decongestive techniques as well as a compression pump.          OBJECTIVE IMPAIRMENTS: cardiopulmonary status limiting activity, decreased activity tolerance, decreased mobility, difficulty walking, decreased ROM, decreased strength, increased edema, and obesity.   ACTIVITY LIMITATIONS: carrying, lifting, stairs, bathing, dressing, and locomotion level  PARTICIPATION LIMITATIONS: shopping and community activity  PERSONAL FACTORS: Age, Fitness, Time since onset of injury/illness/exacerbation, and 1-2 comorbidities: CHF,  are also affecting patient's functional outcome.   REHAB POTENTIAL: Good  CLINICAL DECISION MAKING: Evolving/moderate complexity  EVALUATION COMPLEXITY: Moderate  GOALS: Goals reviewed with patient? Yes  SHORT TERM GOALS: Target date: 02/10/24  PT to be I in HEP to improve lymphatic circulation Baseline: Goal status: met  2.  PT to have lost 1-2 cm in her feet to be able to fit in her shoes, 2-3 cm in legs for improved skin integrity. Baseline:  Goal status: on-going    LONG TERM GOALS: Target date: 03/02/24  PT to have lost 2 -3 cm in her feet, 3-5 cm in her LE for decreased risk of cellulitis.  Baseline:  Goal status: on-going   2.  Pt to have aqcuired juxtafit compression garment and be able to don to be able to continue maintenance phase of treatment.  Baseline:  Goal status: on-going   3.  PT to be I in self manual to assist in increasing lymphatic circulation Baseline:  Goal status: on-going   PLAN:  PT FREQUENCY: 3x/week  PT DURATION: 6 weeks  PLANNED INTERVENTIONS: 97110-Therapeutic exercises, 97535- Self Care, and 16109- Manual therapy  PLAN FOR NEXT SESSION: Continue  total decongestive techniques. Measure on Wed.  Leodis Rainwater, PT CLT [phone]  [facility] Ph: [phone]

## 2024-02-17 ENCOUNTER — Encounter (HOSPITAL_COMMUNITY): Admitting: Physical Therapy

## 2024-02-18 ENCOUNTER — Ambulatory Visit (HOSPITAL_COMMUNITY): Admitting: Physical Therapy

## 2024-02-18 DIAGNOSIS — I890 Lymphedema, not elsewhere classified: Secondary | ICD-10-CM | POA: Diagnosis not present

## 2024-02-18 NOTE — Therapy (Signed)
 OUTPATIENT PHYSICAL THERAPY LYMPHEDEMA TREATMENT/prog  Patient Name: [name] MRN: [id number] DOB:Feb 21, 1946, 78 y.o., female Today's Date: 02/18/2024 Progress Note Reporting Period 01/20/24 to 02/17/21  See note below for Objective Data and Assessment of Progress/Goals.     END OF SESSION:  PT End of Session - 02/18/24 1030     Visit Number 10    Number of Visits 18    Date for PT Re-Evaluation 03/02/24    Authorization Type [employer]    Authorization Time Period 18 visits from 4/24-6/3    Authorization - Visit Number 10    Authorization - Number of Visits 18    Progress Note Due on Visit 18    PT Start Time 0915    PT Stop Time 1023    PT Time Calculation (min) 68 min    Activity Tolerance Patient tolerated treatment well    Behavior During Therapy [facility] for tasks assessed/performed                Past Medical History:  Diagnosis Date   Carpal tunnel syndrome of right wrist 03/2013   recurrent   Chronic diastolic heart failure (HCC)    Cirrhosis, 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 ? to get liver bx if she gets bariatric surgery? Mild portal hypertensive gastropathy on EGD 08/2019.   Fibromyalgia    GAD (generalized anxiety disorder)    GERD    History of hiatal hernia    History of iron  deficiency anemia 12/2018   Inadequate absorption secondary to chronic/long term PPI therapy + portal hypertensive gastroduodenopathy. No GIB found on EGD, colonosc, and givens. Iron  infusions X multiple.   History of thrombocytopenia 12/2011   Hyperlipidemia    Intolerant of statins   HYPERTENSION    IBS (irritable bowel syndrome)    -D.  Good response to bentyl  and imodium  as of 06/2018 GI f/u.   IDDM (insulin  dependent diabetes mellitus)    with DPN (managed by Dr. Aldona Amel but then in 2018 pt preferred to have me manage for her convenience)   Limited mobility    Requires a walker for arthritic pain, widespread  musculoskeletal pain, and neuropathic pain.   Morbid obesity (HCC)    As of 11/2018, pt considering sleeve gastectomy vs bipass as of eval by Dr. Molli Angelucci considering as of 12/2019.   Nonalcoholic steatohepatitis    Viral Hep screens NEG.  CT 2015.  Transaminasemia.  U/S 07/2018 showed early changes of cirrhosis.   OSA (obstructive sleep apnea) 09/14/2015   sleep study 09/07/15: severe obstructive sleep apnea with an AHI of 72 and SaO2 low of 75%.>referred to sleep MD   Osteoarthritis    hips, shoulders, knees   PAF (paroxysmal atrial fibrillation) (HCC)    One documented episode (after getting EGD 2016).  Was on amiodarone  x 3 mo.  Rate control with metoprolol  + anticoag with xarelto . Watchman 12/2022, off anticoag   Portal hypertensive gastropathy (HCC)    hemorrhagic gastropathy + non-bleeding gastric ulcers on EGD 08/2023   Recurrent epistaxis    Granuloma in L nare cauterized by ENT 04/2020. Another cautery 06/2020   Small fiber neuropathy    Due to DM.  Symmetric hands and feet tingling/numbness.   Ulcerative colitis (HCC)    Remicade  infusion Q 8 weeks: in clinical and endoscopic remission as of 12/2018 GI f/u.  06/11/19 rpt colonoscopy->cecal and ascending colon colitis.   Past Surgical History:  Procedure Laterality Date  ABDOMINAL HYSTERECTOMY  1980   Paps no longer indicated.   BACK SURGERY     BACTERIAL OVERGROWTH TEST N/A 07/13/2015   Procedure: BACTERIAL OVERGROWTH TEST;  Surgeon: Ruby Corporal, MD;  [facility];  Service: Endoscopy;  Laterality: N/A;  730     BILATERAL SALPINGOOPHORECTOMY  02/10/2001   BIOPSY  06/11/2019   Procedure: BIOPSY;  Surgeon: Ruby Corporal, MD;  [facility];  Service: Endoscopy;;  colon   BIOPSY  09/27/2019   Procedure: BIOPSY;  Surgeon: Ruby Corporal, MD;  [facility];  Service: Endoscopy;;  gastric duodenal   BIOPSY  09/15/2020   Procedure: BIOPSY;  Surgeon: Urban Garden, MD;   [facility];  Service: Gastroenterology;;   BIOPSY  09/25/2023   Procedure: BIOPSY;  Surgeon: Urban Garden, MD;  [facility];  Service: Gastroenterology;;   BREAST REDUCTION SURGERY  1994   bilat   BREAST SURGERY     CARDIOVASCULAR STRESS TEST  07/2010   Lexiscan myoview: normal   CARPAL TUNNEL RELEASE Right 1996   CARPAL TUNNEL RELEASE Left 03/21/2003   CARPAL TUNNEL RELEASE Right 05/04/2013   Procedure: CARPAL TUNNEL RELEASE;  Surgeon: Amelie Baize., MD;  Location: [facility];  Service: Orthopedics;  Laterality: Right;   CARPAL TUNNEL RELEASE Left 09/21/2013   Procedure: LEFT CARPAL TUNNEL RELEASE;  Surgeon: Amelie Baize., MD;  Location: [facility];  Service: Orthopedics;  Laterality: Left;   CHOLECYSTECTOMY     COLONOSCOPY WITH PROPOFOL  N/A 08/04/2015   Colitis in remission.  No polyps.  Procedure: COLONOSCOPY WITH PROPOFOL ;  Surgeon: Ruby Corporal, MD;  [facility]: [facility];  Service: Endoscopy;  Laterality: N/A;  cecum time in  0820   time out  0827    total time 7 minutes   COLONOSCOPY WITH PROPOFOL  N/A 06/11/2019   cecal and ascending colon colitis.  Procedure: COLONOSCOPY WITH PROPOFOL ;  Surgeon: Ruby Corporal, MD;  [facility];  Service: Endoscopy;  Laterality: N/A;  730a   COLONOSCOPY WITH PROPOFOL  N/A 09/15/2020   Procedure: COLONOSCOPY WITH PROPOFOL ;  Surgeon: Urban Garden, MD;  [facility];  Service: Gastroenterology;  Laterality: N/A;  1030   ESOPHAGEAL DILATION N/A 08/04/2015   Procedure: ESOPHAGEAL DILATION;  Surgeon: Ruby Corporal, MD;  [facility]: [facility];  Service: Endoscopy;  Laterality: N/AFelicita Horns, no mucousal disruption   ESOPHAGOGASTRODUODENOSCOPY  09/27/2019   Performed for IDA.  Esoph dilation was done but no stricture present.  Mild portal hypertensive gastropathy, o/w normal.  Duodenal bx NEG.  h pylori neg.   ESOPHAGOGASTRODUODENOSCOPY      09/25/2023 inactive chronic gastritis, h pylor neg. benign   ESOPHAGOGASTRODUODENOSCOPY     09/25/23 hemorrhagic portal gastropathy + nonbleeding gastric ulcers (H pylori neg)   ESOPHAGOGASTRODUODENOSCOPY (EGD) WITH ESOPHAGEAL DILATION  12/02/2005   ESOPHAGOGASTRODUODENOSCOPY (EGD) WITH PROPOFOL  N/A 08/04/2015   Procedure: ESOPHAGOGASTRODUODENOSCOPY (EGD) WITH PROPOFOL ;  Surgeon: Ruby Corporal, MD;  [facility];  Service: Endoscopy;  Laterality: N/A;  procedure 1   ESOPHAGOGASTRODUODENOSCOPY (EGD) WITH PROPOFOL  N/A 09/27/2019   Procedure: ESOPHAGOGASTRODUODENOSCOPY (EGD) WITH PROPOFOL ;  Surgeon: Ruby Corporal, MD;  [facility];  Service: Endoscopy;  Laterality: N/A;  12:10   ESOPHAGOGASTRODUODENOSCOPY (EGD) WITH PROPOFOL  N/A 10/31/2021   Procedure: ESOPHAGOGASTRODUODENOSCOPY (EGD) WITH PROPOFOL ;  Surgeon: Ruby Corporal, MD;  [facility];  Service: Endoscopy;  Laterality:  N/A;  930   ESOPHAGOGASTRODUODENOSCOPY (EGD) WITH PROPOFOL  N/A 09/25/2023   Procedure: ESOPHAGOGASTRODUODENOSCOPY (EGD) WITH PROPOFOL ;  Surgeon: Urban Garden, MD;  [facility];  Service: Gastroenterology;  Laterality: N/A;  10:30AM;ASA 3   FLEXIBLE SIGMOIDOSCOPY  01/17/2012   Procedure: FLEXIBLE SIGMOIDOSCOPY;  Surgeon: Ruby Corporal, MD;  [facility];  Service: Endoscopy;  Laterality: N/A;   GIVENS CAPSULE STUDY N/A 08/03/2019   Procedure: GIVENS CAPSULE STUDY (performed for IDA)->some food debris in stomach and small amount of blood.  Surgeon: Ruby Corporal, MD;  [facility];  Service: Endoscopy;  Laterality: N/A;  730AM   HEMILAMINOTOMY LUMBAR SPINE Bilateral 09/07/1999   L4-5   HOT HEMOSTASIS  09/25/2023   Procedure: HOT HEMOSTASIS (ARGON PLASMA COAGULATION/BICAP);  Surgeon: Umberto Ganong, Bearl Limes, MD;  [facility];  Service: Gastroenterology;;   KNEE ARTHROSCOPY Right 01/1999; 10/2000   LEFT ATRIAL APPENDAGE OCCLUSION N/A  01/23/2023   Procedure: LEFT ATRIAL APPENDAGE OCCLUSION;  Surgeon: Arnoldo Lapping, MD;  Location: [facility] INVASIVE CV LAB;  Service: Cardiovascular;  Laterality: N/A;   LEFT ATRIAL APPENDAGE OCCLUSION     LYSIS OF ADHESION  02/10/2001   MALONEY DILATION  09/27/2019   Procedure: MALONEY DILATION;  Surgeon: Ruby Corporal, MD;  [facility];  Service: Endoscopy;;   NASAL ENDOSCOPY WITH EPISTAXIS CONTROL Bilateral 01/25/2022   Procedure: NASAL ENDOSCOPY WITH EPISTAXIS CONTROL;  Surgeon: Ammon Bales, MD;  Location: [facility];  Service: ENT;  Laterality: Bilateral;   NASAL SEPTOPLASTY W/ TURBINOPLASTY Bilateral 01/25/2022   Procedure: NASAL SEPTOPLASTY WITH TURBINATE REDUCTION;  Surgeon: Ammon Bales, MD;  Location: [facility] OR;  Service: ENT;  Laterality: Bilateral;   RECTOCELE REPAIR  1990; 09/12/2006   TARSAL TUNNEL RELEASE  2002   TEE WITHOUT CARDIOVERSION N/A 01/23/2023   Procedure: TRANSESOPHAGEAL ECHOCARDIOGRAM;  Surgeon: Arnoldo Lapping, MD;  Location: [facility] INVASIVE CV LAB;  Service: Cardiovascular;  Laterality: N/A;   TRANSESOPHAGEAL ECHOCARDIOGRAM (CATH LAB) N/A 08/26/2023   4mm leak around watchman device. Procedure: TRANSESOPHAGEAL ECHOCARDIOGRAM;  Surgeon: Euell Herrlich, MD;  Location: [facility] INVASIVE CV LAB;  Service: Cardiovascular;  Laterality: N/A;   TRANSTHORACIC ECHOCARDIOGRAM  08/04/2015   EF 60-65%, normal wall motion, mild LVH, mild LA dilation, grd I DD.  11/2023 overall poor quality, EF 55-60%   TUMOR EXCISION Left 03/21/2003   dorsal 1st web space (hand)   URETEROLYSIS Right 02/10/2001   Patient Active Problem List   Diagnosis Date Noted   Acute on chronic diastolic CHF (congestive heart failure) (HCC) 10/31/2023   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 08/26/2023   Atrial fibrillation (HCC) 01/23/2023   Gastroenteritis, infectious, presumed 11/14/2022   Infectious colitis 11/14/2022   Acute metabolic encephalopathy 10/09/2022   Back pain  10/09/2022   Diabetes mellitus (HCC) 10/09/2022   Hypoglycemia 10/09/2022   IBS (irritable bowel syndrome)    Chronic ulcerative pancolitis (HCC) 08/21/2021   Diarrhea 10/03/2020   Cirrhosis, nonalcoholic (HCC) 10/03/2020   Fecal urgency 08/02/2020   Deviated septum 07/28/2020   Epistaxis, recurrent 05/15/2020   Ulcerative colitis without complications (HCC) 08/24/2019   Gastrointestinal hemorrhage 08/24/2019   Iron  deficiency anemia due to chronic blood loss 07/28/2019   Iron  deficiency anemia 05/20/2019   Heme positive stool 05/20/2019   History of iron  deficiency anemia 12/2018   Vertigo 12/04/2015   Uncontrolled type 2 diabetes mellitus with peripheral neuropathy 11/14/2015   OSA (obstructive sleep apnea), severe 09/14/2015   B12 deficiency 08/17/2015  Vitamin B6 deficiency 08/17/2015   PAF (paroxysmal atrial fibrillation) (HCC) 08/17/2015   Chronic anticoagulation 08/17/2015   Atrial fibrillation with RVR (HCC) 08/04/2015   Dysphagia    Gastroesophageal reflux disease without esophagitis    Lymphocytosis 07/04/2015   Obesity 05/18/2014   Allergic sinusitis 12/09/2013   Myalgia 04/28/2012   Polyarthralgia 04/28/2012   Elevated LFTs 01/16/2012   Insomnia 01/12/2012   Small fiber neuropathy 05/30/2011   Anxiety 05/30/2011   Dyslipidemia 01/24/2011   PALPITATIONS, RECURRENT 08/08/2010   Essential hypertension 04/12/2010   GERD 04/12/2010   Ulcerative colitis (HCC) 04/12/2010    PCP: Mandy Second, PA  REFERRING PROVIDER: Mandy Second, PA  REFERRING DIAG: I87.2 (ICD-10-CM) - Venous insufficiency of both lower extremities I89.0,E66.9 (ICD-10-CM) - Lymphedema associated with obesity I87.2 (ICD-10-CM) - Venous stasis dermatitis of both lower extremities  THERAPY DIAG:  Lymphedema, not elsewhere classified  Rationale for Evaluation and Treatment: Rehabilitation  ONSET DATE: chronic but has increased significantly in the past year.   SUBJECTIVE:                                                                                                                                                                                            SUBJECTIVE STATEMENT: Pt has decided to go with juxta fit for foot and LE compression following treatment.  PERTINENT HISTORY:  [name] is a 78 y.o. female with past medical history significant for chronic diastolic congestive heart failure, proximal atrial fibrillation not on anticoagulation due to recurrent epistaxis s/p Watchman procedure (12/2022), CAD on aspirin , type 2 diabetes mellitus, anxiety/depression, nonalcoholic cirrhosis, OSA on CPAP, morbid obesity hospitalized for acute CHF from 10/31/23-11/04/2574 .     PAIN:  Are you having pain?   NPRS scale: 0/10  PRECAUTIONS: Other: cellulitis    RED FLAGS: None   WEIGHT BEARING RESTRICTIONS: No  FALLS:  Has patient fallen in last 6 months? No  LIVING ENVIRONMENT: Lives with: lives with their family Lives in: House/apartment Stairs: Yes: Internal: 2 steps; none and External: 2 steps; none Has following equipment at home: Quad cane small base  PATIENT GOALS: less swelling    OBJECTIVE: Note: Objective measures were completed at Evaluation unless otherwise noted.  COGNITION: Overall cognitive status: Within functional limits for tasks assessed   PALPATION: Increased induration greater in LE but noted in thigh area as well   OBSERVATIONS / OTHER ASSESSMENTS: slight redness and thickening of skin   POSTURE: forward head, rounded shoulders and increased kyphosis  All within normal limits:   LYMPHEDEMA ASSESSMENTS:    LE LANDMARK RIGHT eval Right 01/29/24 02/04/24 02/18/24  At groin      30 cm proximal to suprapatella      20 cm proximal to suprapatella   63.7 64  10 cm proximal to suprapatella   61.9 63.5  At midpatella / popliteal crease 52.5 46.3 49.5 49.8  30 cm proximal to floor at lateral plantar foot 53.5 45.3 46.8 47.3  20 cm proximal to  floor at lateral plantar foot 44  36.6 38.7 37.5  10 cm proximal to floor at lateral plantar foot 29 27.2 27.7 27.5  Circumference of ankle/heel 33.5 33.4 34 33.3  5 cm proximal to 1st MTP joint 24 24 23.8 23.2  Across MTP joint 23 22.8 23 24   Around proximal great toe      (Blank rows = not tested)  LE LANDMARK LEFT eval Left 01/29/24 02/04/24/ 02/18/24  At groin      30 cm proximal to suprapatella      20 cm proximal to suprapatella   66 63.5  10 cm proximal to suprapatella   63 63.8  At midpatella / popliteal crease 53 47 48.5 50.2  30 cm proximal to floor at lateral plantar foot 54.2 50.2 49.4 47  20 cm proximal to floor at lateral plantar foot 40.5 40.5 38.5 40.7  10 cm proximal to floor at lateral plantar foot 32.3 29.5 30.3 30  Circumference of ankle/heel 35.4 34 34.7 33.7  5 cm proximal to 1st MTP joint 24.3 23.6 23.2 23.5  Across MTP joint 23.3 22.5 22.7 22.9  Around proximal great toe      (Blank rows = not tested)  FUNCTIONAL TESTS:  Gait : 1:30 minutes to ambulate with quad cane x 80 ft    TODAY'S TREATMENT:                                                                                                                              DATE:  02/18/2024: Pt measured.  PT received manual to include short neck, deep and superficial abdominal, inguinal/axillary anastomosis as well as anterior LE B.  Compression bandaging from toes to thigh using 1/2 " foam and multilayer short stretch bandaging .  PATIENT EDUCATION:  Education details: skin care recommending using Eucerin or Aquaphor, exercise to include ankle pumps, LAQ, hip ab/adduction, marching and diaphragm breath, what manual will be like as well as multilayer compression dressing.  Person educated: Patient and Spouse Education method: Explanation, Verbal cues, and Handouts Education comprehension: verbalized understanding and returned demonstration  HOME EXERCISE PROGRAM: ankle pumps, LAQ, hip ab/adduction, marching and  diaphragm breath  ASSESSMENT:  CLINICAL IMPRESSION: Pt continues to have hyperplasia, hyperkeratosis and papillomatosis.  She will need compression following compression bandages and due to limited mobility feels that that juxtafit will be more beneficial.  Order will be placed with [facility] today.    Pt will benefit from continued skilled therapy for total decongestive techniques as well as a compression pump.  OBJECTIVE IMPAIRMENTS: cardiopulmonary status limiting activity, decreased activity tolerance, decreased mobility, difficulty walking, decreased ROM, decreased strength, increased edema, and obesity.   ACTIVITY LIMITATIONS: carrying, lifting, stairs, bathing, dressing, and locomotion level  PARTICIPATION LIMITATIONS: shopping and community activity  PERSONAL FACTORS: Age, Fitness, Time since onset of injury/illness/exacerbation, and 1-2 comorbidities: CHF,  are also affecting patient's functional outcome.   REHAB POTENTIAL: Good  CLINICAL DECISION MAKING: Evolving/moderate complexity  EVALUATION COMPLEXITY: Moderate  GOALS: Goals reviewed with patient? Yes  SHORT TERM GOALS: Target date: 02/10/24  PT to be I in HEP to improve lymphatic circulation Baseline: Goal status: met  2.  PT to have lost 1-2 cm in her feet to be able to fit in her shoes, 2-3 cm in legs for improved skin integrity. Baseline:  Goal status: met   LONG TERM GOALS: Target date: 03/02/24  PT to have lost 2 -3 cm in her feet, 3-5 cm in her LE for decreased risk of cellulitis.  Baseline:  Goal status: on-going   2.  Pt to have aqcuired juxtafit compression garment and be able to don to be able to continue maintenance phase of treatment.  Baseline:  Goal status: on-going   3.  PT to be I in self manual to assist in increasing lymphatic circulation Baseline:  Goal status: on-going   PLAN:  PT FREQUENCY: 3x/week  PT DURATION: 6 weeks  PLANNED INTERVENTIONS: 97110-Therapeutic exercises,  97535- Self Care, and 16109- Manual therapy  PLAN FOR NEXT SESSION: Continue  total decongestive techniques. Measure on Wed.  Leodis Rainwater, PT CLT [phone]  [facility] Ph: [phone]

## 2024-02-19 ENCOUNTER — Encounter (HOSPITAL_COMMUNITY): Admitting: Physical Therapy

## 2024-02-20 ENCOUNTER — Ambulatory Visit (HOSPITAL_COMMUNITY): Admitting: Physical Therapy

## 2024-02-20 DIAGNOSIS — I890 Lymphedema, not elsewhere classified: Secondary | ICD-10-CM | POA: Diagnosis not present

## 2024-02-20 NOTE — Therapy (Addendum)
 OUTPATIENT PHYSICAL THERAPY LYMPHEDEMA TREATMENT  Patient Name: [name] MRN: [id number] DOB:03/28/1946, 78 y.o., female Today's Date: 02/20/2024    END OF SESSION:  PT End of Session - 02/20/24 1701     Visit Number 11    Number of Visits 18    Date for PT Re-Evaluation 03/02/24    Authorization Type [employer]    Authorization Time Period 18 visits from 4/24-6/3    Authorization - Visit Number 11    Authorization - Number of Visits 18    Progress Note Due on Visit 18    PT Start Time 1540    PT Stop Time 1645    PT Time Calculation (min) 65 min    Activity Tolerance Patient tolerated treatment well    Behavior During Therapy [facility] for tasks assessed/performed                Past Medical History:  Diagnosis Date   Carpal tunnel syndrome of right wrist 03/2013   recurrent   Chronic diastolic heart failure (HCC)    Cirrhosis, 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 ? to get liver bx if she gets bariatric surgery? Mild portal hypertensive gastropathy on EGD 08/2019.   Fibromyalgia    GAD (generalized anxiety disorder)    GERD    History of hiatal hernia    History of iron  deficiency anemia 12/2018   Inadequate absorption secondary to chronic/long term PPI therapy + portal hypertensive gastroduodenopathy. No GIB found on EGD, colonosc, and givens. Iron  infusions X multiple.   History of thrombocytopenia 12/2011   Hyperlipidemia    Intolerant of statins   HYPERTENSION    IBS (irritable bowel syndrome)    -D.  Good response to bentyl  and imodium  as of 06/2018 GI f/u.   IDDM (insulin  dependent diabetes mellitus)    with DPN (managed by Dr. Aldona Amel but then in 2018 pt preferred to have me manage for her convenience)   Limited mobility    Requires a walker for arthritic pain, widespread musculoskeletal pain, and neuropathic pain.   Morbid obesity (HCC)    As of 11/2018, pt considering sleeve gastectomy vs bipass as of  eval by Dr. Molli Angelucci considering as of 12/2019.   Nonalcoholic steatohepatitis    Viral Hep screens NEG.  CT 2015.  Transaminasemia.  U/S 07/2018 showed early changes of cirrhosis.   OSA (obstructive sleep apnea) 09/14/2015   sleep study 09/07/15: severe obstructive sleep apnea with an AHI of 72 and SaO2 low of 75%.>referred to sleep MD   Osteoarthritis    hips, shoulders, knees   PAF (paroxysmal atrial fibrillation) (HCC)    One documented episode (after getting EGD 2016).  Was on amiodarone  x 3 mo.  Rate control with metoprolol  + anticoag with xarelto . Watchman 12/2022, off anticoag   Portal hypertensive gastropathy (HCC)    hemorrhagic gastropathy + non-bleeding gastric ulcers on EGD 08/2023   Recurrent epistaxis    Granuloma in L nare cauterized by ENT 04/2020. Another cautery 06/2020   Small fiber neuropathy    Due to DM.  Symmetric hands and feet tingling/numbness.   Ulcerative colitis (HCC)    Remicade  infusion Q 8 weeks: in clinical and endoscopic remission as of 12/2018 GI f/u.  06/11/19 rpt colonoscopy->cecal and ascending colon colitis.   Past Surgical History:  Procedure Laterality Date   ABDOMINAL HYSTERECTOMY  1980   Paps no longer indicated.   BACK SURGERY  BACTERIAL OVERGROWTH TEST N/A 07/13/2015   Procedure: BACTERIAL OVERGROWTH TEST;  Surgeon: Ruby Corporal, MD;  [facility];  Service: Endoscopy;  Laterality: N/A;  730     BILATERAL SALPINGOOPHORECTOMY  02/10/2001   BIOPSY  06/11/2019   Procedure: BIOPSY;  Surgeon: Ruby Corporal, MD;  [facility];  Service: Endoscopy;;  colon   BIOPSY  09/27/2019   Procedure: BIOPSY;  Surgeon: Ruby Corporal, MD;  [facility];  Service: Endoscopy;;  gastric duodenal   BIOPSY  09/15/2020   Procedure: BIOPSY;  Surgeon: Urban Garden, MD;  [facility];  Service: Gastroenterology;;   BIOPSY  09/25/2023   Procedure: BIOPSY;  Surgeon: Urban Garden, MD;   [facility];  Service: Gastroenterology;;   BREAST REDUCTION SURGERY  1994   bilat   BREAST SURGERY     CARDIOVASCULAR STRESS TEST  07/2010   Lexiscan myoview: normal   CARPAL TUNNEL RELEASE Right 1996   CARPAL TUNNEL RELEASE Left 03/21/2003   CARPAL TUNNEL RELEASE Right 05/04/2013   Procedure: CARPAL TUNNEL RELEASE;  Surgeon: Amelie Baize., MD;  Location: [facility];  Service: Orthopedics;  Laterality: Right;   CARPAL TUNNEL RELEASE Left 09/21/2013   Procedure: LEFT CARPAL TUNNEL RELEASE;  Surgeon: Amelie Baize., MD;  Location: [facility];  Service: Orthopedics;  Laterality: Left;   CHOLECYSTECTOMY     COLONOSCOPY WITH PROPOFOL  N/A 08/04/2015   Colitis in remission.  No polyps.  Procedure: COLONOSCOPY WITH PROPOFOL ;  Surgeon: Ruby Corporal, MD;  [facility];  Service: Endoscopy;  Laterality: N/A;  cecum time in  0820   time out  0827    total time 7 minutes   COLONOSCOPY WITH PROPOFOL  N/A 06/11/2019   cecal and ascending colon colitis.  Procedure: COLONOSCOPY WITH PROPOFOL ;  Surgeon: Ruby Corporal, MD;  [facility];  Service: Endoscopy;  Laterality: N/A;  730a   COLONOSCOPY WITH PROPOFOL  N/A 09/15/2020   Procedure: COLONOSCOPY WITH PROPOFOL ;  Surgeon: Urban Garden, MD;  [facility];  Service: Gastroenterology;  Laterality: N/A;  1030   ESOPHAGEAL DILATION N/A 08/04/2015   Procedure: ESOPHAGEAL DILATION;  Surgeon: Ruby Corporal, MD;  [facility];  Service: Endoscopy;  Laterality: N/AFelicita Horns, no mucousal disruption   ESOPHAGOGASTRODUODENOSCOPY  09/27/2019   Performed for IDA.  Esoph dilation was done but no stricture present.  Mild portal hypertensive gastropathy, o/w normal.  Duodenal bx NEG.  h pylori neg.   ESOPHAGOGASTRODUODENOSCOPY     09/25/2023 inactive chronic gastritis, h pylor neg. benign   ESOPHAGOGASTRODUODENOSCOPY     09/25/23 hemorrhagic portal gastropathy +  nonbleeding gastric ulcers (H pylori neg)   ESOPHAGOGASTRODUODENOSCOPY (EGD) WITH ESOPHAGEAL DILATION  12/02/2005   ESOPHAGOGASTRODUODENOSCOPY (EGD) WITH PROPOFOL  N/A 08/04/2015   Procedure: ESOPHAGOGASTRODUODENOSCOPY (EGD) WITH PROPOFOL ;  Surgeon: Ruby Corporal, MD;  [facility];  Service: Endoscopy;  Laterality: N/A;  procedure 1   ESOPHAGOGASTRODUODENOSCOPY (EGD) WITH PROPOFOL  N/A 09/27/2019   Procedure: ESOPHAGOGASTRODUODENOSCOPY (EGD) WITH PROPOFOL ;  Surgeon: Ruby Corporal, MD;  [facility];  Service: Endoscopy;  Laterality: N/A;  12:10   ESOPHAGOGASTRODUODENOSCOPY (EGD) WITH PROPOFOL  N/A 10/31/2021   Procedure: ESOPHAGOGASTRODUODENOSCOPY (EGD) WITH PROPOFOL ;  Surgeon: Ruby Corporal, MD;  [facility];  Service: Endoscopy;  Laterality: N/A;  930   ESOPHAGOGASTRODUODENOSCOPY (EGD) WITH PROPOFOL  N/A 09/25/2023   Procedure: ESOPHAGOGASTRODUODENOSCOPY (EGD) WITH PROPOFOL ;  Surgeon: Umberto Ganong, Bearl Limes, MD;  [facility];  Service: Gastroenterology;  Laterality: N/A;  10:30AM;ASA 3   FLEXIBLE SIGMOIDOSCOPY  01/17/2012   Procedure: FLEXIBLE SIGMOIDOSCOPY;  Surgeon: Ruby Corporal, MD;  [facility];  Service: Endoscopy;  Laterality: N/A;   GIVENS CAPSULE STUDY N/A 08/03/2019   Procedure: GIVENS CAPSULE STUDY (performed for IDA)->some food debris in stomach and small amount of blood.  Surgeon: Ruby Corporal, MD;  [facility];  Service: Endoscopy;  Laterality: N/A;  730AM   HEMILAMINOTOMY LUMBAR SPINE Bilateral 09/07/1999   L4-5   HOT HEMOSTASIS  09/25/2023   Procedure: HOT HEMOSTASIS (ARGON PLASMA COAGULATION/BICAP);  Surgeon: Umberto Ganong, Bearl Limes, MD;  [facility];  Service: Gastroenterology;;   KNEE ARTHROSCOPY Right 01/1999; 10/2000   LEFT ATRIAL APPENDAGE OCCLUSION N/A 01/23/2023   Procedure: LEFT ATRIAL APPENDAGE OCCLUSION;  Surgeon: Arnoldo Lapping, MD;  Location: [facility] IN[facility] CV [facility];  Service:  Cardiovascular;  Laterality: N/A;   LEFT ATRIAL APPENDAGE OCCLUSION     LYSIS OF ADHESION  02/10/2001   MALONEY DILATION  09/27/2019   Procedure: MALONEY DILATION;  Surgeon: Ruby Corporal, MD;  [facility];  Service: Endoscopy;;   NASAL ENDOSCOPY WITH EPISTAXIS CONTROL Bilateral 01/25/2022   Procedure: NASAL ENDOSCOPY WITH EPISTAXIS CONTROL;  Surgeon: Ammon Bales, MD;  Location: [facility];  Service: ENT;  Laterality: Bilateral;   NASAL SEPTOPLASTY W/ TURBINOPLASTY Bilateral 01/25/2022   Procedure: NASAL SEPTOPLASTY WITH TURBINATE REDUCTION;  Surgeon: Ammon Bales, MD;  Location: [facility] OR;  Service: ENT;  Laterality: Bilateral;   RECTOCELE REPAIR  1990; 09/12/2006   TARSAL TUNNEL RELEASE  2002   TEE WITHOUT CARDIOVERSION N/A 01/23/2023   Procedure: TRANSESOPHAGEAL ECHOCARDIOGRAM;  Surgeon: Arnoldo Lapping, MD;  Location: [facility] INVASIVE CV LAB;  Service: Cardiovascular;  Laterality: N/A;   TRANSESOPHAGEAL ECHOCARDIOGRAM (CATH LAB) N/A 08/26/2023   4mm leak around watchman device. Procedure: TRANSESOPHAGEAL ECHOCARDIOGRAM;  Surgeon: Euell Herrlich, MD;  Location: [facility] INVASIVE CV LAB;  Service: Cardiovascular;  Laterality: N/A;   TRANSTHORACIC ECHOCARDIOGRAM  08/04/2015   EF 60-65%, normal wall motion, mild LVH, mild LA dilation, grd I DD.  11/2023 overall poor quality, EF 55-60%   TUMOR EXCISION Left 03/21/2003   dorsal 1st web space (hand)   URETEROLYSIS Right 02/10/2001   Patient Active Problem List   Diagnosis Date Noted   Acute on chronic diastolic CHF (congestive heart failure) (HCC) 10/31/2023   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 08/26/2023   Atrial fibrillation (HCC) 01/23/2023   Gastroenteritis, infectious, presumed 11/14/2022   Infectious colitis 11/14/2022   Acute metabolic encephalopathy 10/09/2022   Back pain 10/09/2022   Diabetes mellitus (HCC) 10/09/2022   Hypoglycemia 10/09/2022   IBS (irritable bowel syndrome)    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   Diarrhea 10/03/2020   Cirrhosis, nonalcoholic (HCC) 10/03/2020   Fecal urgency 08/02/2020   Deviated septum 07/28/2020   Epistaxis, recurrent 05/15/2020   Ulcerative colitis without complications (HCC) 08/24/2019   Gastrointestinal hemorrhage 08/24/2019   Iron  deficiency anemia due to chronic blood loss 07/28/2019   Iron  deficiency anemia 05/20/2019   Heme positive stool 05/20/2019   History of iron  deficiency anemia 12/2018   Vertigo 12/04/2015   Uncontrolled type 2 diabetes mellitus with peripheral neuropathy 11/14/2015   OSA (obstructive sleep apnea), severe 09/14/2015   B12 deficiency 08/17/2015   Vitamin B6 deficiency 08/17/2015   PAF (paroxysmal atrial fibrillation) (HCC) 08/17/2015   Chronic anticoagulation 08/17/2015  Atrial fibrillation with RVR (HCC) 08/04/2015   Dysphagia    Gastroesophageal reflux disease without esophagitis    Lymphocytosis 07/04/2015   Obesity 05/18/2014   Allergic sinusitis 12/09/2013   Myalgia 04/28/2012   Polyarthralgia 04/28/2012   Elevated LFTs 01/16/2012   Insomnia 01/12/2012   Small fiber neuropathy 05/30/2011   Anxiety 05/30/2011   Dyslipidemia 01/24/2011   PALPITATIONS, RECURRENT 08/08/2010   Essential hypertension 04/12/2010   GERD 04/12/2010   Ulcerative colitis (HCC) 04/12/2010    PCP: Mandy Second, PA  REFERRING PROVIDER: Mandy Second, PA  REFERRING DIAG: I87.2 (ICD-10-CM) - Venous insufficiency of both lower extremities I89.0,E66.9 (ICD-10-CM) - Lymphedema associated with obesity I87.2 (ICD-10-CM) - Venous stasis dermatitis of both lower extremities  THERAPY DIAG:  Lymphedema, not elsewhere classified  Rationale for Evaluation and Treatment: Rehabilitation  ONSET DATE: chronic but has increased significantly in the past year.   SUBJECTIVE:                                                                                                                                                                                            SUBJECTIVE STATEMENT: PT has not complaints  PERTINENT HISTORY:  [name] is a 78 y.o. female with past medical history significant for chronic diastolic congestive heart failure, proximal atrial fibrillation not on anticoagulation due to recurrent epistaxis s/p Watchman procedure (12/2022), CAD on aspirin , type 2 diabetes mellitus, anxiety/depression, nonalcoholic cirrhosis, OSA on CPAP, morbid obesity hospitalized for acute CHF from 10/31/23-11/04/2574 .     PAIN:  Are you having pain?   NPRS scale: 0/10  PRECAUTIONS: Other: cellulitis    RED FLAGS: None   WEIGHT BEARING RESTRICTIONS: No  FALLS:  Has patient fallen in last 6 months? No  LIVING ENVIRONMENT: Lives with: lives with their family Lives in: House/apartment Stairs: Yes: Internal: 2 steps; none and External: 2 steps; none Has following equipment at home: Quad cane small base  PATIENT GOALS: less swelling    OBJECTIVE: Note: Objective measures were completed at Evaluation unless otherwise noted.  COGNITION: Overall cognitive status: Within functional limits for tasks assessed   PALPATION: Increased induration greater in LE but noted in thigh area as well   OBSERVATIONS / OTHER ASSESSMENTS: slight redness and thickening of skin   POSTURE: forward head, rounded shoulders and increased kyphosis  All within normal limits:   LYMPHEDEMA ASSESSMENTS:    LE LANDMARK RIGHT eval Right 01/29/24 02/04/24 02/18/24  At groin      30 cm proximal to suprapatella      20 cm proximal to suprapatella   63.7 64  10 cm proximal  to suprapatella   61.9 63.5  At midpatella / popliteal crease 52.5 46.3 49.5 49.8  30 cm proximal to floor at lateral plantar foot 53.5 45.3 46.8 47.3  20 cm proximal to floor at lateral plantar foot 44  36.6 38.7 37.5  10 cm proximal to floor at lateral plantar foot 29 27.2 27.7 27.5  Circumference of ankle/heel 33.5 33.4 34 33.3  5 cm proximal to 1st MTP joint  24 24 23.8 23.2  Across MTP joint 23 22.8 23 24   Around proximal great toe      (Blank rows = not tested)  LE LANDMARK LEFT eval Left 01/29/24 02/04/24/ 02/18/24  At groin      30 cm proximal to suprapatella      20 cm proximal to suprapatella   66 63.5  10 cm proximal to suprapatella   63 63.8  At midpatella / popliteal crease 53 47 48.5 50.2  30 cm proximal to floor at lateral plantar foot 54.2 50.2 49.4 47  20 cm proximal to floor at lateral plantar foot 40.5 40.5 38.5 40.7  10 cm proximal to floor at lateral plantar foot 32.3 29.5 30.3 30  Circumference of ankle/heel 35.4 34 34.7 33.7  5 cm proximal to 1st MTP joint 24.3 23.6 23.2 23.5  Across MTP joint 23.3 22.5 22.7 22.9  Around proximal great toe      (Blank rows = not tested)  FUNCTIONAL TESTS:  Gait : 1:30 minutes to ambulate with quad cane x 80 ft    TODAY'S TREATMENT:                                                                                                                              DATE:  02/20/2024:  PT received manual to include short neck, deep and superficial abdominal, inguinal/axillary anastomosis as well as anterior LE B.  Compression bandaging from toes to thigh using 1/2 " foam and multilayer short stretch bandaging . Noted decreased induration in LE, thighs still has mild induration.  PATIENT EDUCATION:  Education details: skin care recommending using Eucerin or Aquaphor, exercise to include ankle pumps, LAQ, hip ab/adduction, marching and diaphragm breath, what manual will be like as well as multilayer compression dressing.  Person educated: Patient and Spouse Education method: Explanation, Verbal cues, and Handouts Education comprehension: verbalized understanding and returned demonstration  HOME EXERCISE PROGRAM: ankle pumps, LAQ, hip ab/adduction, marching and diaphragm breath  ASSESSMENT:  CLINICAL IMPRESSION:  Induration still noted in knee and thigh area.   Pt will benefit from continued skilled  therapy for total decongestive techniques as well as a compression pump.         OBJECTIVE IMPAIRMENTS: cardiopulmonary status limiting activity, decreased activity tolerance, decreased mobility, difficulty walking, decreased ROM, decreased strength, increased edema, and obesity.   ACTIVITY LIMITATIONS: carrying, lifting, stairs, bathing, dressing, and locomotion level  PARTICIPATION LIMITATIONS: shopping and community activity  PERSONAL FACTORS: Age, Fitness, Time since onset of  injury/illness/exacerbation, and 1-2 comorbidities: CHF,  are also affecting patient's functional outcome.   REHAB POTENTIAL: Good  CLINICAL DECISION MAKING: Evolving/moderate complexity  EVALUATION COMPLEXITY: Moderate  GOALS: Goals reviewed with patient? Yes  SHORT TERM GOALS: Target date: 02/10/24  PT to be I in HEP to improve lymphatic circulation Baseline: Goal status: met  2.  PT to have lost 1-2 cm in her feet to be able to fit in her shoes, 2-3 cm in legs for improved skin integrity. Baseline:  Goal status: met   LONG TERM GOALS: Target date: 03/02/24  PT to have lost 2 -3 cm in her feet, 3-5 cm in her LE for decreased risk of cellulitis.  Baseline:  Goal status: on-going   2.  Pt to have aqcuired juxtafit compression garment and be able to don to be able to continue maintenance phase of treatment.  Baseline:  Goal status: on-going   3.  PT to be I in self manual to assist in increasing lymphatic circulation Baseline:  Goal status: on-going   PLAN:  PT FREQUENCY: 3x/week  PT DURATION: 6 weeks  PLANNED INTERVENTIONS: 97110-Therapeutic exercises, 97535- Self Care, and 16109- Manual therapy  PLAN FOR NEXT SESSION: Continue  total decongestive techniques. Measure on Wed.  Leodis Rainwater, PT CLT [phone]  [facility] Ph: [phone]

## 2024-02-22 ENCOUNTER — Other Ambulatory Visit: Payer: Self-pay | Admitting: Family Medicine

## 2024-02-24 ENCOUNTER — Ambulatory Visit: Admitting: Urgent Care

## 2024-02-24 ENCOUNTER — Other Ambulatory Visit: Payer: Self-pay

## 2024-02-24 ENCOUNTER — Telehealth: Payer: Self-pay

## 2024-02-24 ENCOUNTER — Ambulatory Visit (HOSPITAL_COMMUNITY): Admitting: Physical Therapy

## 2024-02-24 DIAGNOSIS — E1140 Type 2 diabetes mellitus with diabetic neuropathy, unspecified: Secondary | ICD-10-CM

## 2024-02-24 DIAGNOSIS — I890 Lymphedema, not elsewhere classified: Secondary | ICD-10-CM

## 2024-02-24 NOTE — Telephone Encounter (Signed)
 Copied from CRM [id number]. Topic: General - Other >> Feb 24, 2024 11:05 AM Eleanore Grey wrote: Reason for CRM: Samantha with [facility] was calling to see if request for recent MD note/office visit detailing why patient needs compression was received because they haven't received it back yet, fax number is [phone]

## 2024-02-24 NOTE — Telephone Encounter (Signed)
 Form and office notes resent to [facility] [employer]

## 2024-02-24 NOTE — Therapy (Signed)
 OUTPATIENT PHYSICAL THERAPY LYMPHEDEMA TREATMENT  Patient Name: [name] MRN: [id number] DOB:[birth year], 78 y.o., female Today's Date: 02/24/2024    END OF SESSION:  PT End of Session - 02/24/24 1151     Visit Number 12    Number of Visits 18    Date for PT Re-Evaluation 03/02/24    Authorization Type [employer]    Authorization Time Period 18 visits from 4/24-6/3    Authorization - Visit Number 12    Authorization - Number of Visits 18    Progress Note Due on Visit 18    PT Start Time 1017    PT Stop Time 1113    PT Time Calculation (min) 56 min    Activity Tolerance Patient tolerated treatment well    Behavior During Therapy [facility] for tasks assessed/performed                Past Medical History:  Diagnosis Date   Carpal tunnel syndrome of right wrist 03/2013   recurrent   Chronic diastolic heart failure (HCC)    Cirrhosis, 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 ? to get liver bx if she gets bariatric surgery? Mild portal hypertensive gastropathy on EGD 08/2019.   Fibromyalgia    GAD (generalized anxiety disorder)    GERD    History of hiatal hernia    History of iron  deficiency anemia 12/2018   Inadequate absorption secondary to chronic/long term PPI therapy + portal hypertensive gastroduodenopathy. No GIB found on EGD, colonosc, and givens. Iron  infusions X multiple.   History of thrombocytopenia 12/2011   Hyperlipidemia    Intolerant of statins   HYPERTENSION    IBS (irritable bowel syndrome)    -D.  Good response to bentyl  and imodium  as of 06/2018 GI f/u.   IDDM (insulin  dependent diabetes mellitus)    with DPN (managed by Dr. Aldona Amel but then in 2018 pt preferred to have me manage for her convenience)   Limited mobility    Requires a walker for arthritic pain, widespread musculoskeletal pain, and neuropathic pain.   Morbid obesity (HCC)    As of 11/2018, pt considering sleeve gastectomy vs bipass as of  eval by Dr. Molli Angelucci considering as of 12/2019.   Nonalcoholic steatohepatitis    Viral Hep screens NEG.  CT 2015.  Transaminasemia.  U/S 07/2018 showed early changes of cirrhosis.   OSA (obstructive sleep apnea) 09/14/2015   sleep study 09/07/15: severe obstructive sleep apnea with an AHI of 72 and SaO2 low of 75%.>referred to sleep MD   Osteoarthritis    hips, shoulders, knees   PAF (paroxysmal atrial fibrillation) (HCC)    One documented episode (after getting EGD 2016).  Was on amiodarone  x 3 mo.  Rate control with metoprolol  + anticoag with xarelto . Watchman 12/2022, off anticoag   Portal hypertensive gastropathy (HCC)    hemorrhagic gastropathy + non-bleeding gastric ulcers on EGD 08/2023   Recurrent epistaxis    Granuloma in L nare cauterized by ENT 04/2020. Another cautery 06/2020   Small fiber neuropathy    Due to DM.  Symmetric hands and feet tingling/numbness.   Ulcerative colitis (HCC)    Remicade  infusion Q 8 weeks: in clinical and endoscopic remission as of 12/2018 GI f/u.  06/11/19 rpt colonoscopy->cecal and ascending colon colitis.   Past Surgical History:  Procedure Laterality Date   ABDOMINAL HYSTERECTOMY  1980   Paps no longer indicated.   BACK SURGERY  BACTERIAL OVERGROWTH TEST N/A 07/13/2015   Procedure: BACTERIAL OVERGROWTH TEST;  Surgeon: Ruby Corporal, MD;  [facility];  Service: Endoscopy;  Laterality: N/A;  730     BILATERAL SALPINGOOPHORECTOMY  02/10/2001   BIOPSY  06/11/2019   Procedure: BIOPSY;  Surgeon: Ruby Corporal, MD;  [facility];  Service: Endoscopy;;  colon   BIOPSY  09/27/2019   Procedure: BIOPSY;  Surgeon: Ruby Corporal, MD;  [facility];  Service: Endoscopy;;  gastric duodenal   BIOPSY  09/15/2020   Procedure: BIOPSY;  Surgeon: Urban Garden, MD;  [facility];  Service: Gastroenterology;;   BIOPSY  09/25/2023   Procedure: BIOPSY;  Surgeon: Urban Garden, MD;   [facility];  Service: Gastroenterology;;   BREAST REDUCTION SURGERY  1994   bilat   BREAST SURGERY     CARDIOVASCULAR STRESS TEST  07/2010   Lexiscan myoview: normal   CARPAL TUNNEL RELEASE Right 1996   CARPAL TUNNEL RELEASE Left 03/21/2003   CARPAL TUNNEL RELEASE Right 05/04/2013   Procedure: CARPAL TUNNEL RELEASE;  Surgeon: Amelie Baize., MD;  Location: [facility];  Service: Orthopedics;  Laterality: Right;   CARPAL TUNNEL RELEASE Left 09/21/2013   Procedure: LEFT CARPAL TUNNEL RELEASE;  Surgeon: Amelie Baize., MD;  Location: [facility];  Service: Orthopedics;  Laterality: Left;   CHOLECYSTECTOMY     COLONOSCOPY WITH PROPOFOL  N/A 08/04/2015   Colitis in remission.  No polyps.  Procedure: COLONOSCOPY WITH PROPOFOL ;  Surgeon: Ruby Corporal, MD;  [facility]: [facility];  Service: Endoscopy;  Laterality: N/A;  cecum time in  0820   time out  0827    total time 7 minutes   COLONOSCOPY WITH PROPOFOL  N/A 06/11/2019   cecal and ascending colon colitis.  Procedure: COLONOSCOPY WITH PROPOFOL ;  Surgeon: Ruby Corporal, MD;  [facility];  Service: Endoscopy;  Laterality: N/A;  730a   COLONOSCOPY WITH PROPOFOL  N/A 09/15/2020   Procedure: COLONOSCOPY WITH PROPOFOL ;  Surgeon: Urban Garden, MD;  [facility];  Service: Gastroenterology;  Laterality: N/A;  1030   ESOPHAGEAL DILATION N/A 08/04/2015   Procedure: ESOPHAGEAL DILATION;  Surgeon: Ruby Corporal, MD;  [facility]: [facility];  Service: Endoscopy;  Laterality: N/AFelicita Horns, no mucousal disruption   ESOPHAGOGASTRODUODENOSCOPY  09/27/2019   Performed for IDA.  Esoph dilation was done but no stricture present.  Mild portal hypertensive gastropathy, o/w normal.  Duodenal bx NEG.  h pylori neg.   ESOPHAGOGASTRODUODENOSCOPY     09/25/2023 inactive chronic gastritis, h pylor neg. benign   ESOPHAGOGASTRODUODENOSCOPY     09/25/23 hemorrhagic portal gastropathy +  nonbleeding gastric ulcers (H pylori neg)   ESOPHAGOGASTRODUODENOSCOPY (EGD) WITH ESOPHAGEAL DILATION  12/02/2005   ESOPHAGOGASTRODUODENOSCOPY (EGD) WITH PROPOFOL  N/A 08/04/2015   Procedure: ESOPHAGOGASTRODUODENOSCOPY (EGD) WITH PROPOFOL ;  Surgeon: Ruby Corporal, MD;  [facility];  Service: Endoscopy;  Laterality: N/A;  procedure 1   ESOPHAGOGASTRODUODENOSCOPY (EGD) WITH PROPOFOL  N/A 09/27/2019   Procedure: ESOPHAGOGASTRODUODENOSCOPY (EGD) WITH PROPOFOL ;  Surgeon: Ruby Corporal, MD;  [facility];  Service: Endoscopy;  Laterality: N/A;  12:10   ESOPHAGOGASTRODUODENOSCOPY (EGD) WITH PROPOFOL  N/A 10/31/2021   Procedure: ESOPHAGOGASTRODUODENOSCOPY (EGD) WITH PROPOFOL ;  Surgeon: Ruby Corporal, MD;  [facility];  Service: Endoscopy;  Laterality: N/A;  930   ESOPHAGOGASTRODUODENOSCOPY (EGD) WITH PROPOFOL  N/A 09/25/2023   Procedure: ESOPHAGOGASTRODUODENOSCOPY (EGD) WITH PROPOFOL ;  Surgeon: Umberto Ganong, Bearl Limes, MD;  [facility];  Service: Gastroenterology;  Laterality: N/A;  10:30AM;ASA 3   FLEXIBLE SIGMOIDOSCOPY  01/17/2012   Procedure: FLEXIBLE SIGMOIDOSCOPY;  Surgeon: Ruby Corporal, MD;  [facility];  Service: Endoscopy;  Laterality: N/A;   GIVENS CAPSULE STUDY N/A 08/03/2019   Procedure: GIVENS CAPSULE STUDY (performed for IDA)->some food debris in stomach and small amount of blood.  Surgeon: Ruby Corporal, MD;  [facility];  Service: Endoscopy;  Laterality: N/A;  730AM   HEMILAMINOTOMY LUMBAR SPINE Bilateral 09/07/1999   L4-5   HOT HEMOSTASIS  09/25/2023   Procedure: HOT HEMOSTASIS (ARGON PLASMA COAGULATION/BICAP);  Surgeon: Umberto Ganong, Bearl Limes, MD;  [facility];  Service: Gastroenterology;;   KNEE ARTHROSCOPY Right 01/1999; 10/2000   LEFT ATRIAL APPENDAGE OCCLUSION N/A 01/23/2023   Procedure: LEFT ATRIAL APPENDAGE OCCLUSION;  Surgeon: Arnoldo Lapping, MD;  Location: [facility];  Service:  Cardiovascular;  Laterality: N/A;   LEFT ATRIAL APPENDAGE OCCLUSION     LYSIS OF ADHESION  02/10/2001   MALONEY DILATION  09/27/2019   Procedure: MALONEY DILATION;  Surgeon: Ruby Corporal, MD;  [facility];  Service: Endoscopy;;   NASAL ENDOSCOPY WITH EPISTAXIS CONTROL Bilateral 01/25/2022   Procedure: NASAL ENDOSCOPY WITH EPISTAXIS CONTROL;  Surgeon: Ammon Bales, MD;  Location[facility];  Service: ENT;  Laterality: Bilateral;   NASAL SEPTOPLASTY W/ TURBINOPLASTY Bilateral 01/25/2022   Procedure: NASAL SEPTOPLASTY WITH TURBINATE REDUCTION;  Surgeon: Ammon Bales, MD;  Location: [facility] OR;  Service: ENT;  Laterality: Bilateral;   RECTOCELE REPAIR  1990; 09/12/2006   TARSAL TUNNEL RELEASE  2002   TEE WITHOUT CARDIOVERSION N/A 01/23/2023   Procedure: TRANSESOPHAGEAL ECHOCARDIOGRAM;  Surgeon: Arnoldo Lapping, MD;  Location: [facility] INVAS[facility] CV LAB;  Service: Cardiovascular;  Laterality: N/A;   TRANSESOPHAGEAL ECHOCARDIOGRAM (CATH LAB) N/A 08/26/2023   4mm leak around watchman device. Procedure: TRANSESOPHAGEAL ECHOCARDIOGRAM;  Surgeon: Euell Herrlich, MD;  Location: [facility]VAS[facility] CV LAB;  Service: Cardiovascular;  Laterality: N/A;   TRANSTHORACIC ECHOCARDIOGRAM  08/04/2015   EF 60-65%, normal wall motion, mild LVH, mild LA dilation, grd I DD.  11/2023 overall poor quality, EF 55-60%   TUMOR EXCISION Left 03/21/2003   dorsal 1st web space (hand)   URETEROLYSIS Right 02/10/2001   Patient Active Problem List   Diagnosis Date Noted   Acute on chronic diastolic CHF (congestive heart failure) (HCC) 10/31/2023   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 08/26/2023   Atrial fibrillation (HCC) 01/23/2023   Gastroenteritis, infectious, presumed 11/14/2022   Infectious colitis 11/14/2022   Acute metabolic encephalopathy 10/09/2022   Back pain 10/09/2022   Diabetes mellitus (HCC) 10/09/2022   Hypoglycemia 10/09/2022   IBS (irritable bowel syndrome)    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   Diarrhea 10/03/2020   Cirrhosis, nonalcoholic (HCC) 10/03/2020   Fecal urgency 08/02/2020   Deviated septum 07/28/2020   Epistaxis, recurrent 05/15/2020   Ulcerative colitis without complications (HCC) 08/24/2019   Gastrointestinal hemorrhage 08/24/2019   Iron  deficiency anemia due to chronic blood loss 07/28/2019   Iron  deficiency anemia 05/20/2019   Heme positive stool 05/20/2019   History of iron  deficiency anemia 12/2018   Vertigo 12/04/2015   Uncontrolled type 2 diabetes mellitus with peripheral neuropathy 11/14/2015   OSA (obstructive sleep apnea), severe 09/14/2015   B12 deficiency 08/17/2015   Vitamin B6 deficiency 08/17/2015   PAF (paroxysmal atrial fibrillation) (HCC) 08/17/2015   Chronic anticoagulation 08/17/2015  Atrial fibrillation with RVR (HCC) 08/04/2015   Dysphagia    Gastroesophageal reflux disease without esophagitis    Lymphocytosis 07/04/2015   Obesity 05/18/2014   Allergic sinusitis 12/09/2013   Myalgia 04/28/2012   Polyarthralgia 04/28/2012   Elevated LFTs 01/16/2012   Insomnia 01/12/2012   Small fiber neuropathy 05/30/2011   Anxiety 05/30/2011   Dyslipidemia 01/24/2011   PALPITATIONS, RECURRENT 08/08/2010   Essential hypertension 04/12/2010   GERD 04/12/2010   Ulcerative colitis (HCC) 04/12/2010    PCP: Mandy Second, PA  REFERRING PROVIDER: Mandy Second, PA  REFERRING DIAG: I87.2 (ICD-10-CM) - Venous insufficiency of both lower extremities I89.0,E66.9 (ICD-10-CM) - Lymphedema associated with obesity I87.2 (ICD-10-CM) - Venous stasis dermatitis of both lower extremities  THERAPY DIAG:  Lymphedema, not elsewhere classified  Rationale for Evaluation and Treatment: Rehabilitation  ONSET DATE: chronic but has increased significantly in the past year.   SUBJECTIVE:                                                                                                                                                                                            SUBJECTIVE STATEMENT: PT has not complaints  PERTINENT HISTORY:  [name] is a 78 y.o. female with past medical history significant for chronic diastolic congestive heart failure, proximal atrial fibrillation not on anticoagulation due to recurrent epistaxis s/p Watchman procedure (12/2022), CAD on aspirin , type 2 diabetes mellitus, anxiety/depression, nonalcoholic cirrhosis, OSA on CPAP, morbid obesity hospitalized for acute CHF from 10/31/23-11/04/2574 .     PAIN:  Are you having pain?   NPRS scale: 0/10  PRECAUTIONS: Other: cellulitis    RED FLAGS: None   WEIGHT BEARING RESTRICTIONS: No  FALLS:  Has patient fallen in last 6 months? No  LIVING ENVIRONMENT: Lives with: lives with their family Lives in: House/apartment Stairs: Yes: Internal: 2 steps; none and External: 2 steps; none Has following equipment at home: Quad cane small base  PATIENT GOALS: less swelling    OBJECTIVE: Note: Objective measures were completed at Evaluation unless otherwise noted.  COGNITION: Overall cognitive status: Within functional limits for tasks assessed   PALPATION: Increased induration greater in LE but noted in thigh area as well   OBSERVATIONS / OTHER ASSESSMENTS: slight redness and thickening of skin   POSTURE: forward head, rounded shoulders and increased kyphosis  All within normal limits:   LYMPHEDEMA ASSESSMENTS:    LE LANDMARK RIGHT eval Right 01/29/24 02/04/24 02/18/24  At groin      30 cm proximal to suprapatella      20 cm proximal to suprapatella   63.7 64  10 cm proximal  to suprapatella   61.9 63.5  At midpatella / popliteal crease 52.5 46.3 49.5 49.8  30 cm proximal to floor at lateral plantar foot 53.5 45.3 46.8 47.3  20 cm proximal to floor at lateral plantar foot 44  36.6 38.7 37.5  10 cm proximal to floor at lateral plantar foot 29 27.2 27.7 27.5  Circumference of ankle/heel 33.5 33.4 34 33.3  5 cm proximal to 1st MTP joint  24 24 23.8 23.2  Across MTP joint 23 22.8 23 24   Around proximal great toe      (Blank rows = not tested)  LE LANDMARK LEFT eval Left 01/29/24 02/04/24/ 02/18/24  At groin      30 cm proximal to suprapatella      20 cm proximal to suprapatella   66 63.5  10 cm proximal to suprapatella   63 63.8  At midpatella / popliteal crease 53 47 48.5 50.2  30 cm proximal to floor at lateral plantar foot 54.2 50.2 49.4 47  20 cm proximal to floor at lateral plantar foot 40.5 40.5 38.5 40.7  10 cm proximal to floor at lateral plantar foot 32.3 29.5 30.3 30  Circumference of ankle/heel 35.4 34 34.7 33.7  5 cm proximal to 1st MTP joint 24.3 23.6 23.2 23.5  Across MTP joint 23.3 22.5 22.7 22.9  Around proximal great toe      (Blank rows = not tested)  FUNCTIONAL TESTS:  Gait : 1:30 minutes to ambulate with quad cane x 80 ft    TODAY'S TREATMENT:                                                                                                                              DATE:  02/24/2024:  PT received manual to include short neck, deep and superficial abdominal, inguinal/axillary anastomosis as well as anterior LE B.  Compression bandaging from toes to thigh using 1/2 " foam and multilayer short stretch bandaging . Noted decreased induration in  thighs  today.   PATIENT EDUCATION:  Education details: skin care recommending using Eucerin or Aquaphor, exercise to include ankle pumps, LAQ, hip ab/adduction, marching and diaphragm breath, what manual will be like as well as multilayer compression dressing.  Person educated: Patient and Spouse Education method: Explanation, Verbal cues, and Handouts Education comprehension: verbalized understanding and returned demonstration  HOME EXERCISE PROGRAM: ankle pumps, LAQ, hip ab/adduction, marching and diaphragm breath  ASSESSMENT:  CLINICAL IMPRESSION: decreased Induration noted in knee and thigh area.   Pt will benefit from continued skilled therapy for total  decongestive techniques until pt has compression garment and pump.        OBJECTIVE IMPAIRMENTS: cardiopulmonary status limiting activity, decreased activity tolerance, decreased mobility, difficulty walking, decreased ROM, decreased strength, increased edema, and obesity.   ACTIVITY LIMITATIONS: carrying, lifting, stairs, bathing, dressing, and locomotion level  PARTICIPATION LIMITATIONS: shopping and community activity  PERSONAL FACTORS: Age, Fitness, Time since onset of injury/illness/exacerbation, and  1-2 comorbidities: CHF,  are also affecting patient's functional outcome.   REHAB POTENTIAL: Good  CLINICAL DECISION MAKING: Evolving/moderate complexity  EVALUATION COMPLEXITY: Moderate  GOALS: Goals reviewed with patient? Yes  SHORT TERM GOALS: Target date: 02/10/24  PT to be I in HEP to improve lymphatic circulation Baseline: Goal status: met  2.  PT to have lost 1-2 cm in her feet to be able to fit in her shoes, 2-3 cm in legs for improved skin integrity. Baseline:  Goal status: met   LONG TERM GOALS: Target date: 03/02/24  PT to have lost 2 -3 cm in her feet, 3-5 cm in her LE for decreased risk of cellulitis.  Baseline:  Goal status: on-going   2.  Pt to have aqcuired juxtafit compression garment and be able to don to be able to continue maintenance phase of treatment.  Baseline:  Goal status: on-going   3.  PT to be I in self manual to assist in increasing lymphatic circulation Baseline:  Goal status: on-going   PLAN:  PT FREQUENCY: 3x/week  PT DURATION: 6 weeks  PLANNED INTERVENTIONS: 97110-Therapeutic exercises, 97535- Self Care, and 40981- Manual therapy  PLAN FOR NEXT SESSION: Continue  total decongestive techniques. Measure on Wed.  Leodis Rainwater, PT CLT [phone]  [facility] Ph: [phone]

## 2024-02-24 NOTE — Progress Notes (Deleted)
 Cardiology Clinic Note   Patient Name: [name] Date of Encounter: 02/24/2024  Primary Care Provider:  Mandy Second, [place] Primary Cardiologist:  Alexandria Angel, MD  Patient Profile    [name] 78 year old female presents the clinic today for follow-up evaluation of her acute on chronic CHF.  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 ? to get liver bx if she gets bariatric surgery? Mild portal hypertensive gastropathy on EGD 08/2019.   Fibromyalgia    GAD (generalized anxiety disorder)    GERD    History of hiatal hernia    History of iron  deficiency anemia 12/2018   Inadequate absorption secondary to chronic/long term PPI therapy + portal hypertensive gastroduodenopathy. No GIB found on EGD, colonosc, and givens. Iron  infusions X multiple.   History of thrombocytopenia 12/2011   Hyperlipidemia    Intolerant of statins   HYPERTENSION    IBS (irritable bowel syndrome)    -D.  Good response to bentyl  and imodium  as of 06/2018 GI f/u.   IDDM (insulin  dependent diabetes mellitus)    with DPN (managed by Dr. Aldona Amel but then in 2018 pt preferred to have me manage for her convenience)   Limited mobility    Requires a walker for arthritic pain, widespread musculoskeletal pain, and neuropathic pain.   Morbid obesity (HCC)    As of 11/2018, pt considering sleeve gastectomy vs bipass as of eval by Dr. Molli Angelucci considering as of 12/2019.   Nonalcoholic steatohepatitis    Viral Hep screens NEG.  CT 2015.  Transaminasemia.  U/S 07/2018 showed early changes of cirrhosis.   OSA (obstructive sleep apnea) 09/14/2015   sleep study 09/07/15: severe obstructive sleep apnea with an AHI of 72 and SaO2 low of 75%.>referred to sleep MD   Osteoarthritis    hips,  shoulders, knees   PAF (paroxysmal atrial fibrillation) (HCC)    One documented episode (after getting EGD 2016).  Was on amiodarone  x 3 mo.  Rate control with metoprolol  + anticoag with xarelto . Watchman 12/2022, off anticoag   Portal hypertensive gastropathy (HCC)    hemorrhagic gastropathy + non-bleeding gastric ulcers on EGD 08/2023   Recurrent epistaxis    Granuloma in L nare cauterized by ENT 04/2020. Another cautery 06/2020   Small fiber neuropathy    Due to DM.  Symmetric hands and feet tingling/numbness.   Ulcerative colitis (HCC)    Remicade  infusion Q 8 weeks: in clinical and endoscopic remission as of 12/2018 GI f/u.  06/11/19 rpt colonoscopy->cecal and ascending colon colitis.   Past Surgical History:  Procedure Laterality Date   ABDOMINAL HYSTERECTOMY  1980   Paps no longer indicated.   BACK SURGERY     BACTERIAL OVERGROWTH TEST N/A 07/13/2015   Procedure: BACTERIAL OVERGROWTH TEST;  Surgeon: Ruby Corporal, MD;  [facility];  Service: Endoscopy;  Laterality: N/A;  730     BILATERAL SALPINGOOPHORECTOMY  02/10/2001   BIOPSY  06/11/2019   Procedure: BIOPSY;  Surgeon: Ruby Corporal, MD;  [facility];  Service: Endoscopy;;  colon   BIOPSY  09/27/2019   Procedure: BIOPSY;  Surgeon: Ruby Corporal, MD;  [facility];  Service: Endoscopy;;  gastric duodenal   BIOPSY  09/15/2020  Procedure: BIOPSY;  Surgeon: Urban Garden, MD;  [facility];  Service: Gastroenterology;;   BIOPSY  09/25/2023   Procedure: BIOPSY;  Surgeon: Urban Garden, MD;  [facility];  Service: Gastroenterology;;   BREAST REDUCTION SURGERY  1994   bilat   BREAST SURGERY     CARDIOVASCULAR STRESS TEST  07/2010   Lexiscan myoview: normal   CARPAL TUNNEL RELEASE Right 1996   CARPAL TUNNEL RELEASE Left 03/21/2003   CARPAL TUNNEL RELEASE Right 05/04/2013   Procedure: CARPAL TUNNEL RELEASE;  Surgeon: Amelie Baize., MD;   Location: [facility];  Service: Orthopedics;  Laterality: Right;   CARPAL TUNNEL RELEASE Left 09/21/2013   Procedure: LEFT CARPAL TUNNEL RELEASE;  Surgeon: Amelie Baize., MD;  Location: [facility];  Service: Orthopedics;  Laterality: Left;   CHOLECYSTECTOMY     COLONOSCOPY WITH PROPOFOL  N/A 08/04/2015   Colitis in remission.  No polyps.  Procedure: COLONOSCOPY WITH PROPOFOL ;  Surgeon: Ruby Corporal, MD;  [facility]: [facility];  Service: Endoscopy;  Laterality: N/A;  cecum time in  0820   time out  0827    total time 7 minutes   COLONOSCOPY WITH PROPOFOL  N/A 06/11/2019   cecal and ascending colon colitis.  Procedure: COLONOSCOPY WITH PROPOFOL ;  Surgeon: Ruby Corporal, MD;  [facility];  Service: Endoscopy;  Laterality: N/A;  730a   COLONOSCOPY WITH PROPOFOL  N/A 09/15/2020   Procedure: COLONOSCOPY WITH PROPOFOL ;  Surgeon: Urban Garden, MD;  [facility];  Service: Gastroenterology;  Laterality: N/A;  1030   ESOPHAGEAL DILATION N/A 08/04/2015   Procedure: ESOPHAGEAL DILATION;  Surgeon: Ruby Corporal, MD;  [facility]: [facility];  Service: Endoscopy;  Laterality: N/AFelicita Horns, no mucousal disruption   ESOPHAGOGASTRODUODENOSCOPY  09/27/2019   Performed for IDA.  Esoph dilation was done but no stricture present.  Mild portal hypertensive gastropathy, o/w normal.  Duodenal bx NEG.  h pylori neg.   ESOPHAGOGASTRODUODENOSCOPY     09/25/2023 inactive chronic gastritis, h pylor neg. benign   ESOPHAGOGASTRODUODENOSCOPY     09/25/23 hemorrhagic portal gastropathy + nonbleeding gastric ulcers (H pylori neg)   ESOPHAGOGASTRODUODENOSCOPY (EGD) WITH ESOPHAGEAL DILATION  12/02/2005   ESOPHAGOGASTRODUODENOSCOPY (EGD) WITH PROPOFOL  N/A 08/04/2015   Procedure: ESOPHAGOGASTRODUODENOSCOPY (EGD) WITH PROPOFOL ;  Surgeon: Ruby Corporal, MD;  [facility]: [facility];  Service: Endoscopy;  Laterality: N/A;  procedure 1   ESOPHAGOGASTRODUODENOSCOPY (EGD)  WITH PROPOFOL  N/A 09/27/2019   Procedure: ESOPHAGOGASTRODUODENOSCOPY (EGD) WITH PROPOFOL ;  Surgeon: Ruby Corporal, MD;  [facility];  Service: Endoscopy;  Laterality: N/A;  12:10   ESOPHAGOGASTRODUODENOSCOPY (EGD) WITH PROPOFOL  N/A 10/31/2021   Procedure: ESOPHAGOGASTRODUODENOSCOPY (EGD) WITH PROPOFOL ;  Surgeon: Ruby Corporal, MD;  [facility];  Service: Endoscopy;  Laterality: N/A;  930   ESOPHAGOGASTRODUODENOSCOPY (EGD) WITH PROPOFOL  N/A 09/25/2023   Procedure: ESOPHAGOGASTRODUODENOSCOPY (EGD) WITH PROPOFOL ;  Surgeon: Urban Garden, MD;  [facility];  Service: Gastroenterology;  Laterality: N/A;  10:30AM;ASA 3   FLEXIBLE SIGMOIDOSCOPY  01/17/2012   Procedure: FLEXIBLE SIGMOIDOSCOPY;  Surgeon: Ruby Corporal, MD;  [facility];  Service: Endoscopy;  Laterality: N/A;   GIVENS CAPSULE STUDY N/A 08/03/2019   Procedure: GIVENS CAPSULE STUDY (performed for IDA)->some food debris in stomach and small amount of blood.  Surgeon: Ruby Corporal, MD;  [facility];  Service: Endoscopy;  Laterality: N/A;  730AM   HEMILAMINOTOMY LUMBAR  SPINE Bilateral 09/07/1999   L4-5   HOT HEMOSTASIS  09/25/2023   Procedure: HOT HEMOSTASIS (ARGON PLASMA COAGULATION/BICAP);  Surgeon: Umberto Ganong, Bearl Limes, MD;  [facility];  Service: Gastroenterology;;   KNEE ARTHROSCOPY Right 01/1999; 10/2000   LEFT ATRIAL APPENDAGE OCCLUSION N/A 01/23/2023   Procedure: LEFT ATRIAL APPENDAGE OCCLUSION;  Surgeon: Arnoldo Lapping, MD;  Location: [facility] CV LAB;  Service: Cardiovascular;  Laterality: N/A;   LEFT ATRIAL APPENDAGE OCCLUSION     LYSIS OF ADHESION  02/10/2001   MALONEY DILATION  09/27/2019   Procedure: MALONEY DILATION;  Surgeon: Ruby Corporal, MD;  [facility];  Service: Endoscopy;;   NASAL ENDOSCOPY WITH EPISTAXIS CONTROL Bilateral 01/25/2022   Procedure: NASAL ENDOSCOPY WITH EPISTAXIS CONTROL;  Surgeon: Ammon Bales, MD;  Location[facility];  Service: ENT;  Laterality: Bilateral;   NASAL SEPTOPLASTY W/ TURBINOPLASTY Bilateral 01/25/2022   Procedure: NASAL SEPTOPLASTY WITH TURBINATE REDUCTION;  Surgeon: Ammon Bales, MD;  Location: [facility] OR;  Service: ENT;  Laterality: Bilateral;   RECTOCELE REPAIR  1990; 09/12/2006   TARSAL TUNNEL RELEASE  2002   TEE WITHOUT CARDIOVERSION N/A 01/23/2023   Procedure: TRANSESOPHAGEAL ECHOCARDIOGRAM;  Surgeon: Arnoldo Lapping, MD;  Location: [facility] INVASIVE CV LAB;  Service: Cardiovascular;  Laterality: N/A;   TRANSESOPHAGEAL ECHOCARDIOGRAM (CATH LAB) N/A 08/26/2023   4mm leak around watchman device. Procedure: TRANSESOPHAGEAL ECHOCARDIOGRAM;  Surgeon: Euell Herrlich, MD;  Location: [facility] INVASIVE CV LAB;  Service: Cardiovascular;  Laterality: N/A;   TRANSTHORACIC ECHOCARDIOGRAM  08/04/2015   EF 60-65%, normal wall motion, mild LVH, mild LA dilation, grd I DD.  11/2023 overall poor quality, EF 55-60%   TUMOR EXCISION Left 03/21/2003   dorsal 1st web space (hand)   URETEROLYSIS Right 02/10/2001    Allergies  Allergies  Allergen Reactions   Flagyl  [Metronidazole  Hcl] Other (See Comments)    DIAPHORESIS   Omeprazole  Anaphylaxis and Swelling    SWELLING OF TONGUE AND THROAT   Pioglitazone Other (See Comments) and Swelling    Weight gain, tongue swelling   Benzocaine-Menthol Swelling    SWELLING OF MOUTH   Metformin  And Related Diarrhea   Shrimp [Shellfish Allergy] Itching    OF THROAT AND EARS, if consumed raw   Statins Palpitations   Welchol [Colesevelam] Other (See Comments)    GI UPSET   Allevyn Adhesive [Wound Dressings]     Other reaction(s): Unknown   Jardiance  [Empagliflozin ] Other (See Comments)    weakness   Lactose Diarrhea and Other (See Comments)    Gas, bloating   Lactose Intolerance (Gi) Diarrhea    Gas, bloating   Adhesive [Tape] Other (See Comments)    SKIN IRRITATION AND BRUISING   Desipramine  Hcl Itching, Nausea Only and Other (See  Comments)    "swimmy" headed, ears itched   Hydromorphone  Itching   Nisoldipine Itching   Percocet [Oxycodone -Acetaminophen ] Itching    History of Present Illness    [name] has a PMH of chronic diastolic CHF, paroxysmal atrial fibrillation (status post watchman 4/24), CAD, type 2 diabetes, OSA on CPAP, HTN, HLD, nonalcoholic cirrhosis, diabetes, and morbid obesity.  She presented to [facility] department on 10/31/2023.  She reported progressive shortness of breath and lower extremity swelling.  She was seen and evaluated by cardiology in the ED.  She noted onset of her symptoms 2 days prior to admission.  She declined headache, chest pain, abdominal pain, fever and chills.  She was admitted and received IV diuresis.  Her dyspnea improved.  Her echocardiogram showed an LVEF of 55-60%.  She was discharged on furosemide  60 mg twice daily.  Lower extremity support stockings were recommended.  Fluid restriction was recommended.  Repeat BMP in 1 week and cardiology follow-up in 2 weeks.  She was instructed to weigh herself daily and maintain a weight log.  She presented to the clinic 11/14/23 for follow-up evaluation and preoperative cardiac evaluation.  She stated she drank a lot of fluid.  She enjoyed water  and Coke.  She had been drinking 2 bottles of Coke per day and also drinking water .  Her weight was noted to be 218 pounds.  She continued to be in atrial fibrillation.  Initially her pulse was 101 and on recheck was 88.  We reviewed the importance of fluid restriction, low-sodium diet, and lower extremity support stockings.  She and her husband expressed understanding.  I gave her a weight log.  Her recent blood work showed stable renal function and electrolytes.  She noted that when she was in the hospital she did receive breathing treatments.  She requested a albuterol  inhaler.  I recommended that she have this filled by her PCP.  She had been somewhat physically active doing  physical therapy 2 times per week.  I recommended that she continued to do physical therapy exercises on her own.  I continued her medication regimen and planned follow-up in 3 to 4 months.  She presents to the clinic today for follow-up evaluation and states***.  Today she denies chest pain, increased shortness of breath,fatigue, palpitations, melena, hematuria, hemoptysis, diaphoresis, weakness, presyncope, syncope, orthopnea, and PND.     Home Medications    Prior to Admission medications   Medication Sig Start Date End Date Taking? Authorizing Provider  Accu-Chek Softclix Lancets lancets USE TO CHECK BLOOD GLUCOSE UP TO 6 TIMES DAILY AS DIRECTED 02/13/23   McGowen, Minetta Aly, MD  acetaminophen  (TYLENOL ) 500 MG tablet Take 1,000 mg by mouth every 6 (six) hours as needed for mild pain (pain score 1-3).    [provider]  aspirin  EC 81 MG tablet Take 1 tablet (81 mg total) by mouth daily. Swallow whole. 07/23/23   McDaniel, Jill D, NP  blood glucose meter kit and supplies KIT Use up to six times daily as directed. DX. E11.9 01/05/20   McGowen, Minetta Aly, MD  canagliflozin  (INVOKANA ) 100 MG TABS tablet Take 1 tablet (100 mg total) by mouth daily before breakfast. 05/29/23   McGowen, Minetta Aly, MD  citalopram  (CELEXA ) 20 MG tablet Take 1 tablet (20 mg total) by mouth daily. 05/29/23   McGowen, Minetta Aly, MD  diphenoxylate -atropine  (LOMOTIL ) 2.5-0.025 MG tablet Take 1 tablet by mouth 4 (four) times daily as needed for diarrhea or loose stools. Patient not taking: Reported on 11/07/2023 12/10/22   Castaneda Mayorga, Bearl Limes, MD  Dulaglutide  (TRULICITY ) 1.5 MG/0.5ML SOAJ INJECT 1.5 MG (0.5ML) UNDER THE SKIN ONCE A WEEK 11/07/23   McGowen, Minetta Aly, MD  esomeprazole  (NEXIUM ) 40 MG capsule Take 1 capsule (40 mg total) by mouth daily before breakfast. 09/25/23   Umberto Ganong, Bearl Limes, MD  furosemide  (LASIX ) 20 MG tablet Take 3 tablets (60 mg total) by mouth 2 (two) times daily. 11/05/23 02/03/24  [place],  Eric J, DO  gabapentin  (NEURONTIN ) 600 MG tablet TAKE 1 AND 1/2 TABLETS BY MOUTH TWICE A DAY 09/30/23   McGowen, Minetta Aly, MD  glucose blood (ACCU-CHEK GUIDE) test strip USE TO CHECK BLOOD GLUCOSE UP TO 6 TIMES DAILY AS  DIRECTED 03/04/23   McGowen, Minetta Aly, MD  HYDROcodone -acetaminophen  (NORCO/VICODIN) 5-325 MG tablet 1-2 tabs po bid prn pain Patient not taking: Reported on 11/07/2023 09/30/23   McGowen, Philip H, MD  INFLIXIMAB  IV Inject into the vein every 8 (eight) weeks. Remicaid 5mg /kg Infusion every 8 weeks [facility]) Patient not taking: Reported on 11/07/2023    [provider]  Insulin  Pen Needle (B-D UF III MINI PEN NEEDLES) 31G X 5 MM MISC USE TO INJECT INSULINS EQUAL TO 6 TIMES DAILY. 03/04/23   McGowen, Minetta Aly, MD  insulin  regular human CONCENTRATED (HUMULIN  R U-500 KWIKPEN) 500 UNIT/ML KwikPen Inject 100 units into the skin at breakfast and lunch. 06/05/23   McGowen, Minetta Aly, MD  lactase (LACTAID) 3000 UNITS tablet Take 3,000 Units by mouth as needed (when eating foods containing dairy).     [provider]  meclizine  (ANTIVERT ) 25 MG tablet Take 1 tablet (25 mg total) by mouth 3 (three) times daily as needed for dizziness or nausea. Patient not taking: Reported on 11/07/2023 12/07/15   Colene Dauphin, MD  metoprolol  succinate (TOPROL -XL) 100 MG 24 hr tablet TAKE 1 TABLET BY MOUTH 2 TIMES DAILY. TAKE WITH OR IMMEDIATELY FOLLOWING A MEAL 05/29/23   McGowen, Minetta Aly, MD  oxybutynin  (DITROPAN  XL) 5 MG 24 hr tablet Take 1 tablet (5 mg total) by mouth at bedtime. Patient not taking: Reported on 10/31/2023 08/08/23   McGowen, Philip H, MD  potassium chloride  (KLOR-CON  M10) 10 MEQ tablet Take 1 tablet (10 mEq total) by mouth daily. Patient taking differently: Take 20 mEq by mouth daily. 05/29/23   McGowen, Minetta Aly, MD  bromocriptine (PARLODEL) 2.5 MG tablet Take 2.5 mg by mouth 2 (two) times daily.  12/05/11  [provider]    Family History    Family History   Problem Relation Age of Onset   Diabetes Mother    Hypertension Mother    Heart attack Father        Mid 25's   Heart disease Father    Lung disease Father        spot on lung; had lung surgery   Alcohol abuse Other    Hypertension Son    Diabetes Son    Emphysema Maternal Grandfather    Asthma Maternal Grandfather    Colon cancer Paternal Grandfather    Stomach cancer Paternal Grandmother    Neuropathy Neg Hx    She indicated that her mother is deceased. She indicated that her father is deceased. She indicated that her maternal grandmother is deceased. She indicated that her maternal grandfather is deceased. She indicated that her paternal grandmother is deceased. She indicated that her paternal grandfather is deceased. She indicated that the status of her son is unknown. She indicated that the status of her neg hx is unknown. She indicated that the status of her other is unknown.  Social History    Social History   Socioeconomic History   Marital status: Married    Spouse name: Not on file   Number of children: Not on file   Years of education: Not on file   Highest education level: Not on file  Occupational History   Occupation: Retired  Tobacco Use   Smoking status: Never   Smokeless tobacco: Never  Vaping Use   Vaping status: Never Used  Substance and Sexual Activity   Alcohol use: Not Currently    Comment: ocassionally   Drug use: No   Sexual activity: Not  Currently    Partners: Male    Birth control/protection: Surgical    Comment: hysterectomy  Other Topics Concern   Not on file  Social History Narrative   She lives with husband in two-story home.  They have one grown son and 2 grandchildren.   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   Highest of level education:  Some college.   Never smoker.   Alcohol: rare.   Social Drivers of Corporate investment banker Strain: Low Risk  (01/21/2024)   Overall Financial Resource Strain (CARDIA)    Difficulty of Paying  Living Expenses: Not hard at all  Food Insecurity: No Food Insecurity (01/21/2024)   Hunger Vital Sign    Worried About Running Out of Food in the Last Year: Never true    Ran Out of Food in the Last Year: Never true  Transportation Needs: No Transportation Needs (01/21/2024)   PRAPARE - Administrator, Civil Service (Medical): No    Lack of Transportation (Non-Medical): No  Physical Activity: Inactive (01/21/2024)   Exercise Vital Sign    Days of Exercise per Week: 0 days    Minutes of Exercise per Session: 0 min  Stress: No Stress Concern Present (01/21/2024)   [employer] of Occupational Health - Occupational Stress Questionnaire    Feeling of Stress : Not at all  Social Connections: Moderately Isolated (01/21/2024)   Social Connection and Isolation Panel [NHANES]    Frequency of Communication with Friends and Family: Three times a week    Frequency of Social Gatherings with Friends and Family: Three times a week    Attends Religious Services: Never    Active Member of Clubs or Organizations: No    Attends Banker Meetings: Never    Marital Status: Married  Catering manager Violence: Not At Risk (11/07/2023)   Humiliation, Afraid, Rape, and Kick questionnaire    Fear of Current or Ex-Partner: No    Emotionally Abused: No    Physically Abused: No    Sexually Abused: No     Review of Systems    General:  No chills, fever, night sweats or weight changes.  Cardiovascular:  No chest pain, dyspnea on exertion, edema, orthopnea, palpitations, paroxysmal nocturnal dyspnea. Dermatological: No rash, lesions/masses Respiratory: No cough, dyspnea Urologic: No hematuria, dysuria Abdominal:   No nausea, vomiting, diarrhea, bright red blood per rectum, melena, or hematemesis Neurologic:  No visual changes, wkns, changes in mental status. All other systems reviewed and are otherwise negative except as noted above.  Physical Exam    VS:  There were no vitals  taken for this visit. , BMI There is no height or weight on file to calculate BMI. GEN: Well nourished, well developed, in no acute distress. HEENT: normal. Neck: Supple, no JVD, carotid bruits, or masses. Cardiac: RRR, no murmurs, rubs, or gallops. No clubbing, cyanosis, bilateral lower extremity 1+ slight pitting edema.  Radials/DP/PT 2+ and equal bilaterally.  Respiratory:  Respirations regular and unlabored, clear to auscultation bilaterally. GI: Soft, nontender, nondistended, BS + x 4. MS: no deformity or atrophy. Skin: warm and dry, no rash. Neuro:  Strength and sensation are intact. Psych: Normal affect.  Accessory Clinical Findings    Recent Labs: 09/19/2023: ALT 20 11/05/2023: B Natriuretic Peptide 537.0 11/17/2023: BUN 18; Creatinine, Ser 0.93; Hemoglobin 9.0; Platelets 152.0; Potassium 3.6; Sodium 139 01/09/2024: Magnesium  1.8   Recent Lipid Panel    Component Value Date/Time   CHOL 149 05/06/2023 1450  TRIG 123 05/06/2023 1450   TRIG 196 01/26/2010 0000   HDL 38 (L) 05/06/2023 1450   CHOLHDL 3.9 05/06/2023 1450   CHOLHDL 5 08/28/2022 1019   VLDL 27.8 08/28/2022 1019   LDLCALC 89 05/06/2023 1450   LDLDIRECT 69.5 01/24/2011 1125    No BP recorded.  {Refresh Note OR Click here to enter BP  :1}***    ECG personally reviewed by me today-   ***  Echocardiogram 11/02/2023  IMPRESSIONS     1. Technically difficult study with poor echo windows, despite Definity   contrast   2. Left ventricular ejection fraction, by estimation, is 55 to 60%. The  left ventricle has normal function. Left ventricular endocardial border  not optimally defined to evaluate regional wall motion. Left ventricular  diastolic function could not be  evaluated.   3. Right ventricular systolic function was not well visualized. The right  ventricular size is not well visualized.   4. The mitral valve was not well visualized. Trivial mitral valve  regurgitation.   5. The aortic valve was not well  visualized. Aortic valve regurgitation  is not visualized.   Comparison(s): Changes from prior study are noted. 08/26/2023: LVEF  60-65%.   FINDINGS   Left Ventricle: Left ventricular ejection fraction, by estimation, is 55  to 60%. The left ventricle has normal function. Left ventricular  endocardial border not optimally defined to evaluate regional wall motion.  Definity  contrast agent was given IV to  delineate the left ventricular endocardial borders. The left ventricular  internal cavity size was normal in size. There is no left ventricular  hypertrophy. Left ventricular diastolic function could not be evaluated  due to atrial fibrillation. Left  ventricular diastolic function could not be evaluated.   Right Ventricle: The right ventricular size is not well visualized. Right  vetricular wall thickness was not well visualized. Right ventricular  systolic function was not well visualized.   Left Atrium: Left atrial size was normal in size.   Right Atrium: Right atrial size was normal in size.   Pericardium: There is no evidence of pericardial effusion.   Mitral Valve: The mitral valve was not well visualized. Trivial mitral  valve regurgitation.   Tricuspid Valve: The tricuspid valve is not well visualized. Tricuspid  valve regurgitation is not demonstrated.   Aortic Valve: The aortic valve was not well visualized. Aortic valve  regurgitation is not visualized.   Pulmonic Valve: The pulmonic valve was not well visualized. Pulmonic valve  regurgitation is not visualized.   Aorta: The aortic root and ascending aorta are structurally normal, with  no evidence of dilitation.   IAS/Shunts: The interatrial septum was not well visualized.       Assessment & Plan   1.  Acute on chronic diastolic CHF-weight today***.  Euvolemic.  Recent admission with requirement of IV diuresis.   Continue furosemide  Continue heart healthy low-sodium diet Fluid restriction-less than 64  ounces daily-reviewed Lower extremity support stockings-reports compliance  Coronary artery disease-denies anginal type episodes.   Denies exertional chest pain. Continue aspirin  therapy Heart healthy low-sodium diet Maintain physical activity  Atrial fibrillation-heart rate today 8***8.  Not a candidate for anticoagulation due to recurrent epistaxis.  She underwent Watchman 4/24. Continue metoprolol  Avoid triggers caffeine, chocolate, EtOH, dehydration etc.-reviewed    Chet Cota. Itsel Opfer NP-C     02/24/2024, 1:12 PM [facility] [address] Office [phone] Fax [phone]    I spent 15***minutes examining this patient, reviewing medications,  and using patient centered shared decision making involving their cardiac care.   I spent  20 minutes reviewing past medical history,  medications, and prior cardiac tests.

## 2024-02-24 NOTE — Telephone Encounter (Signed)
 Form was completed and faxed last week and was sent to the scan center. Office notes not sent though. Will check the media tab later this week and re fax

## 2024-02-25 ENCOUNTER — Ambulatory Visit: Payer: Medicare PPO | Admitting: General Practice

## 2024-02-25 ENCOUNTER — Encounter (HOSPITAL_COMMUNITY): Admitting: Physical Therapy

## 2024-02-26 ENCOUNTER — Ambulatory Visit (HOSPITAL_COMMUNITY): Admitting: Physical Therapy

## 2024-02-26 DIAGNOSIS — I890 Lymphedema, not elsewhere classified: Secondary | ICD-10-CM

## 2024-02-26 NOTE — Therapy (Addendum)
 OUTPATIENT PHYSICAL THERAPY LYMPHEDEMA TREATMENT  Patient Name: [name] MRN: [id number] DOB:Aug 05, 1946, 78 y.o., female Today's Date: 02/26/2024    END OF SESSION:  PT End of Session - 02/26/24 1458     Visit Number 13    Number of Visits 18    Date for PT Re-Evaluation 03/02/24    Authorization Type [employer]    Authorization Time Period 18 visits from 4/24-6/3    Authorization - Visit Number 13    Authorization - Number of Visits 18    Progress Note Due on Visit 18    PT Start Time 1244    PT Stop Time 1342    PT Time Calculation (min) 58 min    Activity Tolerance Patient tolerated treatment well    Behavior During Therapy [facility] for tasks assessed/performed                 Past Medical History:  Diagnosis Date   Carpal tunnel syndrome of right wrist 03/2013   recurrent   Chronic diastolic heart failure (HCC)    Cirrhosis, 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 ? to get liver bx if she gets bariatric surgery? Mild portal hypertensive gastropathy on EGD 08/2019.   Fibromyalgia    GAD (generalized anxiety disorder)    GERD    History of hiatal hernia    History of iron  deficiency anemia 12/2018   Inadequate absorption secondary to chronic/long term PPI therapy + portal hypertensive gastroduodenopathy. No GIB found on EGD, colonosc, and givens. Iron  infusions X multiple.   History of thrombocytopenia 12/2011   Hyperlipidemia    Intolerant of statins   HYPERTENSION    IBS (irritable bowel syndrome)    -D.  Good response to bentyl  and imodium  as of 06/2018 GI f/u.   IDDM (insulin  dependent diabetes mellitus)    with DPN (managed by Dr. Aldona Amel but then in 2018 pt preferred to have me manage for her convenience)   Limited mobility    Requires a walker for arthritic pain, widespread musculoskeletal pain, and neuropathic pain.   Morbid obesity (HCC)    As of 11/2018, pt considering sleeve gastectomy vs bipass as of  eval by Dr. Molli Angelucci considering as of 12/2019.   Nonalcoholic steatohepatitis    Viral Hep screens NEG.  CT 2015.  Transaminasemia.  U/S 07/2018 showed early changes of cirrhosis.   OSA (obstructive sleep apnea) 09/14/2015   sleep study 09/07/15: severe obstructive sleep apnea with an AHI of 72 and SaO2 low of 75%.>referred to sleep MD   Osteoarthritis    hips, shoulders, knees   PAF (paroxysmal atrial fibrillation) (HCC)    One documented episode (after getting EGD 2016).  Was on amiodarone  x 3 mo.  Rate control with metoprolol  + anticoag with xarelto . Watchman 12/2022, off anticoag   Portal hypertensive gastropathy (HCC)    hemorrhagic gastropathy + non-bleeding gastric ulcers on EGD 08/2023   Recurrent epistaxis    Granuloma in L nare cauterized by ENT 04/2020. Another cautery 06/2020   Small fiber neuropathy    Due to DM.  Symmetric hands and feet tingling/numbness.   Ulcerative colitis (HCC)    Remicade  infusion Q 8 weeks: in clinical and endoscopic remission as of 12/2018 GI f/u.  06/11/19 rpt colonoscopy->cecal and ascending colon colitis.   Past Surgical History:  Procedure Laterality Date   ABDOMINAL HYSTERECTOMY  1980   Paps no longer indicated.   BACK SURGERY  BACTERIAL OVERGROWTH TEST N/A 07/13/2015   Procedure: BACTERIAL OVERGROWTH TEST;  Surgeon: Ruby Corporal, MD;  [facility];  Service: Endoscopy;  Laterality: N/A;  730     BILATERAL SALPINGOOPHORECTOMY  02/10/2001   BIOPSY  06/11/2019   Procedure: BIOPSY;  Surgeon: Ruby Corporal, MD;  [facility];  Service: Endoscopy;;  colon   BIOPSY  09/27/2019   Procedure: BIOPSY;  Surgeon: Ruby Corporal, MD;  [facility];  Service: Endoscopy;;  gastric duodenal   BIOPSY  09/15/2020   Procedure: BIOPSY;  Surgeon: Urban Garden, MD;  [facility];  Service: Gastroenterology;;   BIOPSY  09/25/2023   Procedure: BIOPSY;  Surgeon: Urban Garden, MD;   [facility];  Service: Gastroenterology;;   BREAST REDUCTION SURGERY  1994   bilat   BREAST SURGERY     CARDIOVASCULAR STRESS TEST  07/2010   Lexiscan myoview: normal   CARPAL TUNNEL RELEASE Right 1996   CARPAL TUNNEL RELEASE Left 03/21/2003   CARPAL TUNNEL RELEASE Right 05/04/2013   Procedure: CARPAL TUNNEL RELEASE;  Surgeon: Amelie Baize., MD;  Location: [facility];  Service: Orthopedics;  Laterality: Right;   CARPAL TUNNEL RELEASE Left 09/21/2013   Procedure: LEFT CARPAL TUNNEL RELEASE;  Surgeon: Amelie Baize., MD;  Location: [facility];  Service: Orthopedics;  Laterality: Left;   CHOLECYSTECTOMY     COLONOSCOPY WITH PROPOFOL  N/A 08/04/2015   Colitis in remission.  No polyps.  Procedure: COLONOSCOPY WITH PROPOFOL ;  Surgeon: Ruby Corporal, MD;  [facility]: [facility];  Service: Endoscopy;  Laterality: N/A;  cecum time in  0820   time out  0827    total time 7 minutes   COLONOSCOPY WITH PROPOFOL  N/A 06/11/2019   cecal and ascending colon colitis.  Procedure: COLONOSCOPY WITH PROPOFOL ;  Surgeon: Ruby Corporal, MD;  [facility];  Service: Endoscopy;  Laterality: N/A;  730a   COLONOSCOPY WITH PROPOFOL  N/A 09/15/2020   Procedure: COLONOSCOPY WITH PROPOFOL ;  Surgeon: Urban Garden, MD;  [facility];  Service: Gastroenterology;  Laterality: N/A;  1030   ESOPHAGEAL DILATION N/A 08/04/2015   Procedure: ESOPHAGEAL DILATION;  Surgeon: Ruby Corporal, MD;  [facility];  Service: Endoscopy;  Laterality: N/AFelicita Horns, no mucousal disruption   ESOPHAGOGASTRODUODENOSCOPY  09/27/2019   Performed for IDA.  Esoph dilation was done but no stricture present.  Mild portal hypertensive gastropathy, o/w normal.  Duodenal bx NEG.  h pylori neg.   ESOPHAGOGASTRODUODENOSCOPY     09/25/2023 inactive chronic gastritis, h pylor neg. benign   ESOPHAGOGASTRODUODENOSCOPY     09/25/23 hemorrhagic portal gastropathy +  nonbleeding gastric ulcers (H pylori neg)   ESOPHAGOGASTRODUODENOSCOPY (EGD) WITH ESOPHAGEAL DILATION  12/02/2005   ESOPHAGOGASTRODUODENOSCOPY (EGD) WITH PROPOFOL  N/A 08/04/2015   Procedure: ESOPHAGOGASTRODUODENOSCOPY (EGD) WITH PROPOFOL ;  Surgeon: Ruby Corporal, MD;  [facility];  Service: Endoscopy;  Laterality: N/A;  procedure 1   ESOPHAGOGASTRODUODENOSCOPY (EGD) WITH PROPOFOL  N/A 09/27/2019   Procedure: ESOPHAGOGASTRODUODENOSCOPY (EGD) WITH PROPOFOL ;  Surgeon: Ruby Corporal, MD;  [facility];  Service: Endoscopy;  Laterality: N/A;  12:10   ESOPHAGOGASTRODUODENOSCOPY (EGD) WITH PROPOFOL  N/A 10/31/2021   Procedure: ESOPHAGOGASTRODUODENOSCOPY (EGD) WITH PROPOFOL ;  Surgeon: Ruby Corporal, MD;  [facility];  Service: Endoscopy;  Laterality: N/A;  930   ESOPHAGOGASTRODUODENOSCOPY (EGD) WITH PROPOFOL  N/A 09/25/2023   Procedure: ESOPHAGOGASTRODUODENOSCOPY (EGD) WITH PROPOFOL ;  Surgeon: Umberto Ganong, Bearl Limes, MD;  [facility];  Service: Gastroenterology;  Laterality: N/A;  10:30AM;ASA 3   FLEXIBLE SIGMOIDOSCOPY  01/17/2012   Procedure: FLEXIBLE SIGMOIDOSCOPY;  Surgeon: Ruby Corporal, MD;  [facility];  Service: Endoscopy;  Laterality: N/A;   GIVENS CAPSULE STUDY N/A 08/03/2019   Procedure: GIVENS CAPSULE STUDY (performed for IDA)->some food debris in stomach and small amount of blood.  Surgeon: Ruby Corporal, MD;  [facility];  Service: Endoscopy;  Laterality: N/A;  730AM   HEMILAMINOTOMY LUMBAR SPINE Bilateral 09/07/1999   L4-5   HOT HEMOSTASIS  09/25/2023   Procedure: HOT HEMOSTASIS (ARGON PLASMA COAGULATION/BICAP);  Surgeon: Umberto Ganong, Bearl Limes, MD;  [facility];  Service: Gastroenterology;;   KNEE ARTHROSCOPY Right 01/1999; 10/2000   LEFT ATRIAL APPENDAGE OCCLUSION N/A 01/23/2023   Procedure: LEFT ATRIAL APPENDAGE OCCLUSION;  Surgeon: Arnoldo Lapping, MD;  Location: [facility] INVASIVE CV LAB;  Service:  Cardiovascular;  Laterality: N/A;   LEFT ATRIAL APPENDAGE OCCLUSION     LYSIS OF ADHESION  02/10/2001   MALONEY DILATION  09/27/2019   Procedure: MALONEY DILATION;  Surgeon: Ruby Corporal, MD;  [facility];  Service: Endoscopy;;   NASAL ENDOSCOPY WITH EPISTAXIS CONTROL Bilateral 01/25/2022   Procedure: NASAL ENDOSCOPY WITH EPISTAXIS CONTROL;  Surgeon: Ammon Bales, MD;  Location[facility];  Service: ENT;  Laterality: Bilateral;   NASAL SEPTOPLASTY W/ TURBINOPLASTY Bilateral 01/25/2022   Procedure: NASAL SEPTOPLASTY WITH TURBINATE REDUCTION;  Surgeon: Ammon Bales, MD;  Location: [facility] OR;  Service: ENT;  Laterality: Bilateral;   RECTOCELE REPAIR  1990; 09/12/2006   TARSAL TUNNEL RELEASE  2002   TEE WITHOUT CARDIOVERSION N/A 01/23/2023   Procedure: TRANSESOPHAGEAL ECHOCARDIOGRAM;  Surgeon: Arnoldo Lapping, MD;  Location: [facility] INVASIVE CV LAB;  Service: Cardiovascular;  Laterality: N/A;   TRANSESOPHAGEAL ECHOCARDIOGRAM (CATH LAB) N/A 08/26/2023   4mm leak around watchman device. Procedure: TRANSESOPHAGEAL ECHOCARDIOGRAM;  Surgeon: Euell Herrlich, MD;  Location: [facility] INVASIVE CV LAB;  Service: Cardiovascular;  Laterality: N/A;   TRANSTHORACIC ECHOCARDIOGRAM  08/04/2015   EF 60-65%, normal wall motion, mild LVH, mild LA dilation, grd I DD.  11/2023 overall poor quality, EF 55-60%   TUMOR EXCISION Left 03/21/2003   dorsal 1st web space (hand)   URETEROLYSIS Right 02/10/2001   Patient Active Problem List   Diagnosis Date Noted   Acute on chronic diastolic CHF (congestive heart failure) (HCC) 10/31/2023   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 08/26/2023   Atrial fibrillation (HCC) 01/23/2023   Gastroenteritis, infectious, presumed 11/14/2022   Infectious colitis 11/14/2022   Acute metabolic encephalopathy 10/09/2022   Back pain 10/09/2022   Diabetes mellitus (HCC) 10/09/2022   Hypoglycemia 10/09/2022   IBS (irritable bowel syndrome)    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   Diarrhea 10/03/2020   Cirrhosis, nonalcoholic (HCC) 10/03/2020   Fecal urgency 08/02/2020   Deviated septum 07/28/2020   Epistaxis, recurrent 05/15/2020   Ulcerative colitis without complications (HCC) 08/24/2019   Gastrointestinal hemorrhage 08/24/2019   Iron  deficiency anemia due to chronic blood loss 07/28/2019   Iron  deficiency anemia 05/20/2019   Heme positive stool 05/20/2019   History of iron  deficiency anemia 12/2018   Vertigo 12/04/2015   Uncontrolled type 2 diabetes mellitus with peripheral neuropathy 11/14/2015   OSA (obstructive sleep apnea), severe 09/14/2015   B12 deficiency 08/17/2015   Vitamin B6 deficiency 08/17/2015   PAF (paroxysmal atrial fibrillation) (HCC) 08/17/2015   Chronic anticoagulation 08/17/2015  Atrial fibrillation with RVR (HCC) 08/04/2015   Dysphagia    Gastroesophageal reflux disease without esophagitis    Lymphocytosis 07/04/2015   Obesity 05/18/2014   Allergic sinusitis 12/09/2013   Myalgia 04/28/2012   Polyarthralgia 04/28/2012   Elevated LFTs 01/16/2012   Insomnia 01/12/2012   Small fiber neuropathy 05/30/2011   Anxiety 05/30/2011   Dyslipidemia 01/24/2011   PALPITATIONS, RECURRENT 08/08/2010   Essential hypertension 04/12/2010   GERD 04/12/2010   Ulcerative colitis (HCC) 04/12/2010    PCP: Mandy Second, PA  REFERRING PROVIDER: Mandy Second, PA  REFERRING DIAG: I87.2 (ICD-10-CM) - Venous insufficiency of both lower extremities I89.0,E66.9 (ICD-10-CM) - Lymphedema associated with obesity I87.2 (ICD-10-CM) - Venous stasis dermatitis of both lower extremities  THERAPY DIAG:  Lymphedema, not elsewhere classified  Rationale for Evaluation and Treatment: Rehabilitation  ONSET DATE: chronic but has increased significantly in the past year.   SUBJECTIVE:                                                                                                                                                                                            SUBJECTIVE STATEMENT: PT and pt husband states that they can tell that she is able to walk easier   PERTINENT HISTORY:  [name] is a 78 y.o. female with past medical history significant for chronic diastolic congestive heart failure, proximal atrial fibrillation not on anticoagulation due to recurrent epistaxis s/p Watchman procedure (12/2022), CAD on aspirin , type 2 diabetes mellitus, anxiety/depression, nonalcoholic cirrhosis, OSA on CPAP, morbid obesity hospitalized for acute CHF from 10/31/23-11/04/2574 .     PAIN:  Are you having pain?   NPRS scale: 0/10  PRECAUTIONS: Other: cellulitis    RED FLAGS: None   WEIGHT BEARING RESTRICTIONS: No  FALLS:  Has patient fallen in last 6 months? No  LIVING ENVIRONMENT: Lives with: lives with their family Lives in: House/apartment Stairs: Yes: Internal: 2 steps; none and External: 2 steps; none Has following equipment at home: Quad cane small base  PATIENT GOALS: less swelling    OBJECTIVE: Note: Objective measures were completed at Evaluation unless otherwise noted.  COGNITION: Overall cognitive status: Within functional limits for tasks assessed   PALPATION: Increased induration greater in LE but noted in thigh area as well   OBSERVATIONS / OTHER ASSESSMENTS: slight redness and thickening of skin   POSTURE: forward head, rounded shoulders and increased kyphosis  All within normal limits:   LYMPHEDEMA ASSESSMENTS:    LE LANDMARK RIGHT eval Right 01/29/24 02/04/24 02/18/24 02/26/24  At groin       30 cm proximal to suprapatella  20 cm proximal to suprapatella   63.7 64 63.2  10 cm proximal to suprapatella   61.9 63.5 59.5  At midpatella / popliteal crease 52.5 46.3 49.5 49.8 46.7  30 cm proximal to floor at lateral plantar foot 53.5 45.3 46.8 47.3 45.7  20 cm proximal to floor at lateral plantar foot 44  36.6 38.7 37.5 35.8  10 cm proximal to floor at lateral plantar foot 29 27.2  27.7 27.5 26.8  Circumference of ankle/heel 33.5 33.4 34 33.3 32.8  5 cm proximal to 1st MTP joint 24 24 23.8 23.2 23.3  Across MTP joint 23 22.8 23 24  22.8  Around proximal great toe       (Blank rows = not tested)  LE LANDMARK LEFT eval Left 01/29/24 02/04/24/ 02/18/24 02/26/24  At groin       30 cm proximal to suprapatella       20 cm proximal to suprapatella   66 63.5 63.5  10 cm proximal to suprapatella   63 63.8 61.8  At midpatella / popliteal crease 53 47 48.5 50.2 46.8  30 cm proximal to floor at lateral plantar foot 54.2 50.2 49.4 47 47.2  20 cm proximal to floor at lateral plantar foot 40.5 40.5 38.5 40.7 40  10 cm proximal to floor at lateral plantar foot 32.3 29.5 30.3 30 29.5  Circumference of ankle/heel 35.4 34 34.7 33.7 34.6  5 cm proximal to 1st MTP joint 24.3 23.6 23.2 23.5 23.1  Across MTP joint 23.3 22.5 22.7 22.9 22.5  Around proximal great toe       (Blank rows = not tested)  FUNCTIONAL TESTS:  Gait : 1:30 minutes to ambulate with quad cane x 80 ft    TODAY'S TREATMENT:                                                                                                                              DATE: measurement:  02/26/2024:  PT received manual to include short neck, inguinal/axillary anastomosis as well as anterior LE B (abdominal withheld due to flare up of IBS.  Compression bandaging from toes to thigh using 1/2 " foam and multilayer short stretch bandaging . Continue decreased induration in B  thighs  today.   PATIENT EDUCATION:  Education details: skin care recommending using Eucerin or Aquaphor, exercise to include ankle pumps, LAQ, hip ab/adduction, marching and diaphragm breath, what manual will be like as well as multilayer compression dressing.  Person educated: Patient and Spouse Education method: Explanation, Verbal cues, and Handouts Education comprehension: verbalized understanding and returned demonstration  HOME EXERCISE PROGRAM: ankle pumps, LAQ,  hip ab/adduction, marching and diaphragm breath  ASSESSMENT:  CLINICAL IMPRESSION: Pt measured with decrease in measurements in almost all areas.  Pt still has not heard from [facility], therapist gave pt [facility] number to check on order.  Therapist will fax new measurements to [facility] and pt has continued to decrease in her edema.  Pt will benefit from continued skilled therapy for total decongestive techniques until pt has compression garment and pump.        OBJECTIVE IMPAIRMENTS: cardiopulmonary status limiting activity, decreased activity tolerance, decreased mobility, difficulty walking, decreased ROM, decreased strength, increased edema, and obesity.   ACTIVITY LIMITATIONS: carrying, lifting, stairs, bathing, dressing, and locomotion level  PARTICIPATION LIMITATIONS: shopping and community activity  PERSONAL FACTORS: Age, Fitness, Time since onset of injury/illness/exacerbation, and 1-2 comorbidities: CHF,  are also affecting patient's functional outcome.   REHAB POTENTIAL: Good  CLINICAL DECISION MAKING: Evolving/moderate complexity  EVALUATION COMPLEXITY: Moderate  GOALS: Goals reviewed with patient? Yes  SHORT TERM GOALS: Target date: 02/10/24  PT to be I in HEP to improve lymphatic circulation Baseline: Goal status: met  2.  PT to have lost 1-2 cm in her feet to be able to fit in her shoes, 2-3 cm in legs for improved skin integrity. Baseline:  Goal status: met   LONG TERM GOALS: Target date: 03/02/24  PT to have lost 2 -3 cm in her feet, 3-5 cm in her LE for decreased risk of cellulitis.  Baseline:  Goal status: on-going   2.  Pt to have aqcuired juxtafit compression garment and be able to don to be able to continue maintenance phase of treatment.  Baseline:  Goal status: on-going   3.  PT to be I in self manual to assist in increasing lymphatic circulation Baseline:  Goal status: on-going   PLAN:  PT FREQUENCY: 3x/week  PT DURATION: 6 weeks  PLANNED  INTERVENTIONS: 97110-Therapeutic exercises, 97535- Self Care, and 11914- Manual therapy  PLAN FOR NEXT SESSION: Continue  total decongestive techniques. Measure on Wed.  Leodis Rainwater, PT CLT [phone]  [facility] Ph: [phone]

## 2024-02-27 ENCOUNTER — Other Ambulatory Visit: Payer: Self-pay

## 2024-02-27 ENCOUNTER — Encounter (HOSPITAL_COMMUNITY): Admitting: Physical Therapy

## 2024-02-27 DIAGNOSIS — E1140 Type 2 diabetes mellitus with diabetic neuropathy, unspecified: Secondary | ICD-10-CM

## 2024-02-27 MED ORDER — DAPAGLIFLOZIN PROPANEDIOL 5 MG PO TABS: 5.0000 mg | ORAL_TABLET | Freq: Every day | ORAL | 1 refills | Status: DC

## 2024-02-28 ENCOUNTER — Other Ambulatory Visit: Payer: Self-pay | Admitting: Family Medicine

## 2024-03-01 ENCOUNTER — Ambulatory Visit (HOSPITAL_COMMUNITY): Attending: Urgent Care | Admitting: Physical Therapy

## 2024-03-01 DIAGNOSIS — I890 Lymphedema, not elsewhere classified: Secondary | ICD-10-CM | POA: Diagnosis not present

## 2024-03-01 NOTE — Therapy (Addendum)
 OUTPATIENT PHYSICAL THERAPY LYMPHEDEMA TREATMENT  Patient Name: [name] MRN: [id number] DOB:1945-12-25, 78 y.o., female Today's Date: 03/01/2024    END OF SESSION:  PT End of Session - 03/01/24 1629     Visit Number 14    Number of Visits 18    Date for PT Re-Evaluation 03/02/24    Authorization Type [employer]    Authorization Time Period 18 visits from 4/24-6/3    Authorization - Number of Visits 18    Progress Note Due on Visit 18    PT Start Time 1315    PT Stop Time 1400    PT Time Calculation (min) 45 min    Activity Tolerance Patient tolerated treatment well    Behavior During Therapy [facility] for tasks assessed/performed                 Past Medical History:  Diagnosis Date   Carpal tunnel syndrome of right wrist 03/2013   recurrent   Chronic diastolic heart failure (HCC)    Cirrhosis, 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 ? to get liver bx if she gets bariatric surgery? Mild portal hypertensive gastropathy on EGD 08/2019.   Fibromyalgia    GAD (generalized anxiety disorder)    GERD    History of hiatal hernia    History of iron  deficiency anemia 12/2018   Inadequate absorption secondary to chronic/long term PPI therapy + portal hypertensive gastroduodenopathy. No GIB found on EGD, colonosc, and givens. Iron  infusions X multiple.   History of thrombocytopenia 12/2011   Hyperlipidemia    Intolerant of statins   HYPERTENSION    IBS (irritable bowel syndrome)    -D.  Good response to bentyl  and imodium  as of 06/2018 GI f/u.   IDDM (insulin  dependent diabetes mellitus)    with DPN (managed by Dr. Aldona Amel but then in 2018 pt preferred to have me manage for her convenience)   Limited mobility    Requires a walker for arthritic pain, widespread musculoskeletal pain, and neuropathic pain.   Morbid obesity (HCC)    As of 11/2018, pt considering sleeve gastectomy vs bipass as of eval by Dr. Molli Angelucci considering  as of 12/2019.   Nonalcoholic steatohepatitis    Viral Hep screens NEG.  CT 2015.  Transaminasemia.  U/S 07/2018 showed early changes of cirrhosis.   OSA (obstructive sleep apnea) 09/14/2015   sleep study 09/07/15: severe obstructive sleep apnea with an AHI of 72 and SaO2 low of 75%.>referred to sleep MD   Osteoarthritis    hips, shoulders, knees   PAF (paroxysmal atrial fibrillation) (HCC)    One documented episode (after getting EGD 2016).  Was on amiodarone  x 3 mo.  Rate control with metoprolol  + anticoag with xarelto . Watchman 12/2022, off anticoag   Portal hypertensive gastropathy (HCC)    hemorrhagic gastropathy + non-bleeding gastric ulcers on EGD 08/2023   Recurrent epistaxis    Granuloma in L nare cauterized by ENT 04/2020. Another cautery 06/2020   Small fiber neuropathy    Due to DM.  Symmetric hands and feet tingling/numbness.   Ulcerative colitis (HCC)    Remicade  infusion Q 8 weeks: in clinical and endoscopic remission as of 12/2018 GI f/u.  06/11/19 rpt colonoscopy->cecal and ascending colon colitis.   Past Surgical History:  Procedure Laterality Date   ABDOMINAL HYSTERECTOMY  1980   Paps no longer indicated.   BACK SURGERY     BACTERIAL OVERGROWTH TEST N/A 07/13/2015  Procedure: BACTERIAL OVERGROWTH TEST;  Surgeon: Ruby Corporal, MD;  [facility];  Service: Endoscopy;  Laterality: N/A;  730     BILATERAL SALPINGOOPHORECTOMY  02/10/2001   BIOPSY  06/11/2019   Procedure: BIOPSY;  Surgeon: Ruby Corporal, MD;  [facility];  Service: Endoscopy;;  colon   BIOPSY  09/27/2019   Procedure: BIOPSY;  Surgeon: Ruby Corporal, MD;  [facility];  Service: Endoscopy;;  gastric duodenal   BIOPSY  09/15/2020   Procedure: BIOPSY;  Surgeon: Urban Garden, MD;  [facility];  Service: Gastroenterology;;   BIOPSY  09/25/2023   Procedure: BIOPSY;  Surgeon: Urban Garden, MD;  [facility];  Service:  Gastroenterology;;   BREAST REDUCTION SURGERY  1994   bilat   BREAST SURGERY     CARDIOVASCULAR STRESS TEST  07/2010   Lexiscan myoview: normal   CARPAL TUNNEL RELEASE Right 1996   CARPAL TUNNEL RELEASE Left 03/21/2003   CARPAL TUNNEL RELEASE Right 05/04/2013   Procedure: CARPAL TUNNEL RELEASE;  Surgeon: Amelie Baize., MD;  Location: [facility];  Service: Orthopedics;  Laterality: Right;   CARPAL TUNNEL RELEASE Left 09/21/2013   Procedure: LEFT CARPAL TUNNEL RELEASE;  Surgeon: Amelie Baize., MD;  Location: [facility];  Service: Orthopedics;  Laterality: Left;   CHOLECYSTECTOMY     COLONOSCOPY WITH PROPOFOL  N/A 08/04/2015   Colitis in remission.  No polyps.  Procedure: COLONOSCOPY WITH PROPOFOL ;  Surgeon: Ruby Corporal, MD;  [facility]: [facility];  Service: Endoscopy;  Laterality: N/A;  cecum time in  0820   time out  0827    total time 7 minutes   COLONOSCOPY WITH PROPOFOL  N/A 06/11/2019   cecal and ascending colon colitis.  Procedure: COLONOSCOPY WITH PROPOFOL ;  Surgeon: Ruby Corporal, MD;  [facility];  Service: Endoscopy;  Laterality: N/A;  730a   COLONOSCOPY WITH PROPOFOL  N/A 09/15/2020   Procedure: COLONOSCOPY WITH PROPOFOL ;  Surgeon: Urban Garden, MD;  [facility];  Service: Gastroenterology;  Laterality: N/A;  1030   ESOPHAGEAL DILATION N/A 08/04/2015   Procedure: ESOPHAGEAL DILATION;  Surgeon: Ruby Corporal, MD;  [facility];  Service: Endoscopy;  Laterality: N/AFelicita Horns, no mucousal disruption   ESOPHAGOGASTRODUODENOSCOPY  09/27/2019   Performed for IDA.  Esoph dilation was done but no stricture present.  Mild portal hypertensive gastropathy, o/w normal.  Duodenal bx NEG.  h pylori neg.   ESOPHAGOGASTRODUODENOSCOPY     09/25/2023 inactive chronic gastritis, h pylor neg. benign   ESOPHAGOGASTRODUODENOSCOPY     09/25/23 hemorrhagic portal gastropathy + nonbleeding gastric ulcers (H pylori neg)    ESOPHAGOGASTRODUODENOSCOPY (EGD) WITH ESOPHAGEAL DILATION  12/02/2005   ESOPHAGOGASTRODUODENOSCOPY (EGD) WITH PROPOFOL  N/A 08/04/2015   Procedure: ESOPHAGOGASTRODUODENOSCOPY (EGD) WITH PROPOFOL ;  Surgeon: Ruby Corporal, MD;  [facility];  Service: Endoscopy;  Laterality: N/A;  procedure 1   ESOPHAGOGASTRODUODENOSCOPY (EGD) WITH PROPOFOL  N/A 09/27/2019   Procedure: ESOPHAGOGASTRODUODENOSCOPY (EGD) WITH PROPOFOL ;  Surgeon: Ruby Corporal, MD;  [facility];  Service: Endoscopy;  Laterality: N/A;  12:10   ESOPHAGOGASTRODUODENOSCOPY (EGD) WITH PROPOFOL  N/A 10/31/2021   Procedure: ESOPHAGOGASTRODUODENOSCOPY (EGD) WITH PROPOFOL ;  Surgeon: Ruby Corporal, MD;  [facility];  Service: Endoscopy;  Laterality: N/A;  930   ESOPHAGOGASTRODUODENOSCOPY (EGD) WITH PROPOFOL  N/A 09/25/2023   Procedure: ESOPHAGOGASTRODUODENOSCOPY (EGD) WITH PROPOFOL ;  Surgeon: Urban Garden, MD;  [facility];  Service: Gastroenterology;  Laterality: N/A;  10:30AM;ASA 3   FLEXIBLE SIGMOIDOSCOPY  01/17/2012   Procedure: FLEXIBLE SIGMOIDOSCOPY;  Surgeon: Ruby Corporal, MD;  [facility];  Service: Endoscopy;  Laterality: N/A;   GIVENS CAPSULE STUDY N/A 08/03/2019   Procedure: GIVENS CAPSULE STUDY (performed for IDA)->some food debris in stomach and small amount of blood.  Surgeon: Ruby Corporal, MD;  [facility];  Service: Endoscopy;  Laterality: N/A;  730AM   HEMILAMINOTOMY LUMBAR SPINE Bilateral 09/07/1999   L4-5   HOT HEMOSTASIS  09/25/2023   Procedure: HOT HEMOSTASIS (ARGON PLASMA COAGULATION/BICAP);  Surgeon: Umberto Ganong, Bearl Limes, MD;  [facility];  Service: Gastroenterology;;   KNEE ARTHROSCOPY Right 01/1999; 10/2000   LEFT ATRIAL APPENDAGE OCCLUSION N/A 01/23/2023   Procedure: LEFT ATRIAL APPENDAGE OCCLUSION;  Surgeon: Arnoldo Lapping, MD;  Location: [facility];  Service: Cardiovascular;  Laterality: N/A;   LEFT  ATRIAL APPENDAGE OCCLUSION     LYSIS OF ADHESION  02/10/2001   MALONEY DILATION  09/27/2019   Procedure: MALONEY DILATION;  Surgeon: Ruby Corporal, MD;  [facility];  Service: Endoscopy;;   NASAL ENDOSCOPY WITH EPISTAXIS CONTROL Bilateral 01/25/2022   Procedure: NASAL ENDOSCOPY WITH EPISTAXIS CONTROL;  Surgeon: Ammon Bales, MD;  Location: [facility];  Service: ENT;  Laterality: Bilateral;   NASAL SEPTOPLASTY W/ TURBINOPLASTY Bilateral 01/25/2022   Procedure: NASAL SEPTOPLASTY WITH TURBINATE REDUCTION;  Surgeon: Ammon Bales, MD;  Location: [facility] OR;  Service: ENT;  Laterality: Bilateral;   RECTOCELE REPAIR  1990; 09/12/2006   TARSAL TUNNEL RELEASE  2002   TEE WITHOUT CARDIOVERSION N/A 01/23/2023   Procedure: TRANSESOPHAGEAL ECHOCARDIOGRAM;  Surgeon: Arnoldo Lapping, MD;  Location: [facility] INVASIVE CV LAB;  Service: Cardiovascular;  Laterality: N/A;   TRANSESOPHAGEAL ECHOCARDIOGRAM (CATH LAB) N/A 08/26/2023   4mm leak around watchman device. Procedure: TRANSESOPHAGEAL ECHOCARDIOGRAM;  Surgeon: Euell Herrlich, MD;  Location: [facility] INVASIVE CV LAB;  Service: Cardiovascular;  Laterality: N/A;   TRANSTHORACIC ECHOCARDIOGRAM  08/04/2015   EF 60-65%, normal wall motion, mild LVH, mild LA dilation, grd I DD.  11/2023 overall poor quality, EF 55-60%   TUMOR EXCISION Left 03/21/2003   dorsal 1st web space (hand)   URETEROLYSIS Right 02/10/2001   Patient Active Problem List   Diagnosis Date Noted   Acute on chronic diastolic CHF (congestive heart failure) (HCC) 10/31/2023   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 08/26/2023   Atrial fibrillation (HCC) 01/23/2023   Gastroenteritis, infectious, presumed 11/14/2022   Infectious colitis 11/14/2022   Acute metabolic encephalopathy 10/09/2022   Back pain 10/09/2022   Diabetes mellitus (HCC) 10/09/2022   Hypoglycemia 10/09/2022   IBS (irritable bowel syndrome)    Chronic ulcerative pancolitis (HCC) 08/21/2021   Diarrhea  10/03/2020   Cirrhosis, nonalcoholic (HCC) 10/03/2020   Fecal urgency 08/02/2020   Deviated septum 07/28/2020   Epistaxis, recurrent 05/15/2020   Ulcerative colitis without complications (HCC) 08/24/2019   Gastrointestinal hemorrhage 08/24/2019   Iron  deficiency anemia due to chronic blood loss 07/28/2019   Iron  deficiency anemia 05/20/2019   Heme positive stool 05/20/2019   History of iron  deficiency anemia 12/2018   Vertigo 12/04/2015   Uncontrolled type 2 diabetes mellitus with peripheral neuropathy 11/14/2015   OSA (obstructive sleep apnea), severe 09/14/2015   B12 deficiency 08/17/2015   Vitamin B6 deficiency 08/17/2015   PAF (paroxysmal atrial fibrillation) (HCC) 08/17/2015   Chronic anticoagulation 08/17/2015   Atrial fibrillation with RVR (  HCC) 08/04/2015   Dysphagia    Gastroesophageal reflux disease without esophagitis    Lymphocytosis 07/04/2015   Obesity 05/18/2014   Allergic sinusitis 12/09/2013   Myalgia 04/28/2012   Polyarthralgia 04/28/2012   Elevated LFTs 01/16/2012   Insomnia 01/12/2012   Small fiber neuropathy 05/30/2011   Anxiety 05/30/2011   Dyslipidemia 01/24/2011   PALPITATIONS, RECURRENT 08/08/2010   Essential hypertension 04/12/2010   GERD 04/12/2010   Ulcerative colitis (HCC) 04/12/2010    PCP: Mandy Second, PA  REFERRING PROVIDER: Mandy Second, PA  REFERRING DIAG: I87.2 (ICD-10-CM) - Venous insufficiency of both lower extremities I89.0,E66.9 (ICD-10-CM) - Lymphedema associated with obesity I87.2 (ICD-10-CM) - Venous stasis dermatitis of both lower extremities  THERAPY DIAG:  Lymphedema, not elsewhere classified  Rationale for Evaluation and Treatment: Rehabilitation  ONSET DATE: chronic but has increased significantly in the past year.   SUBJECTIVE:                                                                                                                                                                                            SUBJECTIVE STATEMENT: Pt and spouse report no issues today.  Pt has continued to exercise, elevate and use her compression daily as she has for the past 6 weeks.  She continues to have edema in her thighs and stomach.   PERTINENT HISTORY:  RALPH BENAVIDEZ is a 78 y.o. female with past medical history significant for chronic diastolic congestive heart failure, proximal atrial fibrillation not on anticoagulation due to recurrent epistaxis s/p Watchman procedure (12/2022), CAD on aspirin , type 2 diabetes mellitus, anxiety/depression, nonalcoholic cirrhosis, OSA on CPAP, morbid obesity hospitalized for acute CHF from 10/31/23-11/04/2574 .     PAIN:  Are you having pain?   NPRS scale: 0/10  PRECAUTIONS: Other: cellulitis    RED FLAGS: None   WEIGHT BEARING RESTRICTIONS: No  FALLS:  Has patient fallen in last 6 months? No  LIVING ENVIRONMENT: Lives with: lives with their family Lives in: House/apartment Stairs: Yes: Internal: 2 steps; none and External: 2 steps; none Has following equipment at home: Quad cane small base  PATIENT GOALS: less swelling    OBJECTIVE: Note: Objective measures were completed at Evaluation unless otherwise noted.  COGNITION: Overall cognitive status: Within functional limits for tasks assessed   PALPATION: Increased induration greater in LE but noted in thigh area as well   OBSERVATIONS / OTHER ASSESSMENTS: slight redness and thickening of skin   POSTURE: forward head, rounded shoulders and increased kyphosis  All within normal limits:   LYMPHEDEMA ASSESSMENTS:    LE LANDMARK RIGHT eval Right 01/29/24 02/04/24 02/18/24  02/26/24  At groin       30 cm proximal to suprapatella       20 cm proximal to suprapatella   63.7 64 63.2  10 cm proximal to suprapatella   61.9 63.5 59.5  At midpatella / popliteal crease 52.5 46.3 49.5 49.8 46.7  30 cm proximal to floor at lateral plantar foot 53.5 45.3 46.8 47.3 45.7  20 cm proximal to floor at lateral plantar  foot 44  36.6 38.7 37.5 35.8  10 cm proximal to floor at lateral plantar foot 29 27.2 27.7 27.5 26.8  Circumference of ankle/heel 33.5 33.4 34 33.3 32.8  5 cm proximal to 1st MTP joint 24 24 23.8 23.2 23.3  Across MTP joint 23 22.8 23 24  22.8  Around proximal great toe       (Blank rows = not tested)  LE LANDMARK LEFT eval Left 01/29/24 02/04/24/ 02/18/24 02/26/24  At groin       30 cm proximal to suprapatella       20 cm proximal to suprapatella   66 63.5 63.5  10 cm proximal to suprapatella   63 63.8 61.8  At midpatella / popliteal crease 53 47 48.5 50.2 46.8  30 cm proximal to floor at lateral plantar foot 54.2 50.2 49.4 47 47.2  20 cm proximal to floor at lateral plantar foot 40.5 40.5 38.5 40.7 40  10 cm proximal to floor at lateral plantar foot 32.3 29.5 30.3 30 29.5  Circumference of ankle/heel 35.4 34 34.7 33.7 34.6  5 cm proximal to 1st MTP joint 24.3 23.6 23.2 23.5 23.1  Across MTP joint 23.3 22.5 22.7 22.9 22.5  Around proximal great toe       (Blank rows = not tested)  FUNCTIONAL TESTS:  Gait : 1:30 minutes to ambulate with quad cane x 80 ft    TODAY'S TREATMENT:                                                                                                                              DATE:  03/01/24:   Manual:  not completed due to time, late arrival Bandaging:  full LE's bilaterally  with 1/2" foam and multiple layers of short stretch bandaging  02/26/2024:  PT received manual to include short neck, inguinal/axillary anastomosis as well as anterior LE B (abdominal withheld due to flare up of IBS.  Compression bandaging from toes to thigh using 1/2 " foam and multilayer short stretch bandaging . Continue decreased induration in B  thighs  today.    PATIENT EDUCATION:  Education details: skin care recommending using Eucerin or Aquaphor, exercise to include ankle pumps, LAQ, hip ab/adduction, marching and diaphragm breath, what manual will be like as well as multilayer  compression dressing.  Person educated: Patient and Spouse Education method: Explanation, Verbal cues, and Handouts Education comprehension: verbalized understanding and returned demonstration  HOME EXERCISE PROGRAM: ankle pumps, LAQ, hip ab/adduction, marching and diaphragm  breath  ASSESSMENT:  CLINICAL IMPRESSION:  PT with 15 minute late arrival so was unable to complete manual today.  LE's moisturized and bandaged as previously.  Spouse admits to not calling [facility] to check on garments or pump; reminded to do so. Pt has continued to exercise, elevate and use her compression daily as she has for the past 6 weeks.  She continues to have edema in her thighs and stomach.  Pt will benefit from continued skilled therapy for total decongestive techniques until pt has compression garment and pump.       OBJECTIVE IMPAIRMENTS: cardiopulmonary status limiting activity, decreased activity tolerance, decreased mobility, difficulty walking, decreased ROM, decreased strength, increased edema, and obesity.   ACTIVITY LIMITATIONS: carrying, lifting, stairs, bathing, dressing, and locomotion level  PARTICIPATION LIMITATIONS: shopping and community activity  PERSONAL FACTORS: Age, Fitness, Time since onset of injury/illness/exacerbation, and 1-2 comorbidities: CHF,  are also affecting patient's functional outcome.   REHAB POTENTIAL: Good  CLINICAL DECISION MAKING: Evolving/moderate complexity  EVALUATION COMPLEXITY: Moderate  GOALS: Goals reviewed with patient? Yes  SHORT TERM GOALS: Target date: 02/10/24  PT to be I in HEP to improve lymphatic circulation Baseline: Goal status: met  2.  PT to have lost 1-2 cm in her feet to be able to fit in her shoes, 2-3 cm in legs for improved skin integrity. Baseline:  Goal status: met   LONG TERM GOALS: Target date: 03/02/24  PT to have lost 2 -3 cm in her feet, 3-5 cm in her LE for decreased risk of cellulitis.  Baseline:  Goal status: on-going    2.  Pt to have aqcuired juxtafit compression garment and be able to don to be able to continue maintenance phase of treatment.  Baseline:  Goal status: on-going   3.  PT to be I in self manual to assist in increasing lymphatic circulation Baseline:  Goal status: on-going   PLAN:  PT FREQUENCY: 3x/week  PT DURATION: 6 weeks  PLANNED INTERVENTIONS: 97110-Therapeutic exercises, 97535- Self Care, and 16109- Manual therapy  PLAN FOR NEXT SESSION: Continue  total decongestive techniques. Measure on Wed and complete cert next visit if needed.  [employer] Auth Request  PT has used 14/18 approved visits, however date of authorization will be over on 03/02/24.  Request 2 more visits for a total of 20 and extend authorization to 03/22/24   Referring diagnosis code (ICD 10)? 189 Treatment diagnosis codes (ICD 10)? (if different than referring diagnosis) 189 What was this (referring dx) caused by? []  Surgery []  Fall []  Ongoing issue []  Arthritis [x]  Other: __most likely heredity __________   Laterality: []  Rt []  Lt [x]  Both   Deficits: []  Pain []  Stiffness []  Weakness [x]  Edema []  Balance Deficits []  Coordination []  Gait Disturbance [x]  ROM []  Other              Functional Tool Score: see measurements.    CPT codes: See Planned Interventions listed in the Plan section of the Evaluation. Lorenso Romance, PTA/CLT/Cynthia Brinckerhoff, PT CLT [phone]  [facility] Ph: [phone]

## 2024-03-03 ENCOUNTER — Ambulatory Visit (HOSPITAL_COMMUNITY): Admitting: Physical Therapy

## 2024-03-03 DIAGNOSIS — I890 Lymphedema, not elsewhere classified: Secondary | ICD-10-CM

## 2024-03-03 NOTE — Therapy (Addendum)
 OUTPATIENT PHYSICAL THERAPY LYMPHEDEMA TREATMENT  Patient Name: [name] MRN: [id number] DOB:[birth year], 78 y.o., female Today's Date: 03/03/2024    END OF SESSION:  PT End of Session - 03/03/24 1639     Visit Number 15    Number of Visits 18    Date for PT Re-Evaluation 03/02/24    Authorization Type [employer]    Authorization Time Period 18 visits from 4/24-6/14    Authorization - Visit Number 15    Authorization - Number of Visits 18    Progress Note Due on Visit 18    PT Start Time 1348    PT Stop Time 1445    PT Time Calculation (min) 57 min    Activity Tolerance Patient tolerated treatment well    Behavior During Therapy [facility] for tasks assessed/performed                Past Medical History:  Diagnosis Date   Carpal tunnel syndrome of right wrist 03/2013   recurrent   Chronic diastolic heart failure (HCC)    Cirrhosis, 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 ? to get liver bx if she gets bariatric surgery? Mild portal hypertensive gastropathy on EGD 08/2019.   Fibromyalgia    GAD (generalized anxiety disorder)    GERD    History of hiatal hernia    History of iron  deficiency anemia 12/2018   Inadequate absorption secondary to chronic/long term PPI therapy + portal hypertensive gastroduodenopathy. No GIB found on EGD, colonosc, and givens. Iron  infusions X multiple.   History of thrombocytopenia 12/2011   Hyperlipidemia    Intolerant of statins   HYPERTENSION    IBS (irritable bowel syndrome)    -D.  Good response to bentyl  and imodium  as of 06/2018 GI f/u.   IDDM (insulin  dependent diabetes mellitus)    with DPN (managed by Dr. Aldona Amel but then in 2018 pt preferred to have me manage for her convenience)   Limited mobility    Requires a walker for arthritic pain, widespread musculoskeletal pain, and neuropathic pain.   Morbid obesity (HCC)    As of 11/2018, pt considering sleeve gastectomy vs bipass as of  eval by Dr. Molli Angelucci considering as of 12/2019.   Nonalcoholic steatohepatitis    Viral Hep screens NEG.  CT 2015.  Transaminasemia.  U/S 07/2018 showed early changes of cirrhosis.   OSA (obstructive sleep apnea) 09/14/2015   sleep study 09/07/15: severe obstructive sleep apnea with an AHI of 72 and SaO2 low of 75%.>referred to sleep MD   Osteoarthritis    hips, shoulders, knees   PAF (paroxysmal atrial fibrillation) (HCC)    One documented episode (after getting EGD 2016).  Was on amiodarone  x 3 mo.  Rate control with metoprolol  + anticoag with xarelto . Watchman 12/2022, off anticoag   Portal hypertensive gastropathy (HCC)    hemorrhagic gastropathy + non-bleeding gastric ulcers on EGD 08/2023   Recurrent epistaxis    Granuloma in L nare cauterized by ENT 04/2020. Another cautery 06/2020   Small fiber neuropathy    Due to DM.  Symmetric hands and feet tingling/numbness.   Ulcerative colitis (HCC)    Remicade  infusion Q 8 weeks: in clinical and endoscopic remission as of 12/2018 GI f/u.  06/11/19 rpt colonoscopy->cecal and ascending colon colitis.   Past Surgical History:  Procedure Laterality Date   ABDOMINAL HYSTERECTOMY  1980   Paps no longer indicated.   BACK SURGERY  BACTERIAL OVERGROWTH TEST N/A 07/13/2015   Procedure: BACTERIAL OVERGROWTH TEST;  Surgeon: Ruby Corporal, MD;  [facility];  Service: Endoscopy;  Laterality: N/A;  730     BILATERAL SALPINGOOPHORECTOMY  02/10/2001   BIOPSY  06/11/2019   Procedure: BIOPSY;  Surgeon: Ruby Corporal, MD;  [facility];  Service: Endoscopy;;  colon   BIOPSY  09/27/2019   Procedure: BIOPSY;  Surgeon: Ruby Corporal, MD;  [facility];  Service: Endoscopy;;  gastric duodenal   BIOPSY  09/15/2020   Procedure: BIOPSY;  Surgeon: Urban Garden, MD;  [facility];  Service: Gastroenterology;;   BIOPSY  09/25/2023   Procedure: BIOPSY;  Surgeon: Urban Garden, MD;   [facility];  Service: Gastroenterology;;   BREAST REDUCTION SURGERY  1994   bilat   BREAST SURGERY     CARDIOVASCULAR STRESS TEST  07/2010   Lexiscan myoview: normal   CARPAL TUNNEL RELEASE Right 1996   CARPAL TUNNEL RELEASE Left 03/21/2003   CARPAL TUNNEL RELEASE Right 05/04/2013   Procedure: CARPAL TUNNEL RELEASE;  Surgeon: Amelie Baize., MD;  Location: [facility];  Service: Orthopedics;  Laterality: Right;   CARPAL TUNNEL RELEASE Left 09/21/2013   Procedure: LEFT CARPAL TUNNEL RELEASE;  Surgeon: Amelie Baize., MD;  Location: [facility];  Service: Orthopedics;  Laterality: Left;   CHOLECYSTECTOMY     COLONOSCOPY WITH PROPOFOL  N/A 08/04/2015   Colitis in remission.  No polyps.  Procedure: COLONOSCOPY WITH PROPOFOL ;  Surgeon: Ruby Corporal, MD;  [facility]: [facility];  Service: Endoscopy;  Laterality: N/A;  cecum time in  0820   time out  0827    total time 7 minutes   COLONOSCOPY WITH PROPOFOL  N/A 06/11/2019   cecal and ascending colon colitis.  Procedure: COLONOSCOPY WITH PROPOFOL ;  Surgeon: Ruby Corporal, MD;  [facility];  Service: Endoscopy;  Laterality: N/A;  730a   COLONOSCOPY WITH PROPOFOL  N/A 09/15/2020   Procedure: COLONOSCOPY WITH PROPOFOL ;  Surgeon: Urban Garden, MD;  [facility];  Service: Gastroenterology;  Laterality: N/A;  1030   ESOPHAGEAL DILATION N/A 08/04/2015   Procedure: ESOPHAGEAL DILATION;  Surgeon: Ruby Corporal, MD;  [facility]: [facility];  Service: Endoscopy;  Laterality: N/AFelicita Horns, no mucousal disruption   ESOPHAGOGASTRODUODENOSCOPY  09/27/2019   Performed for IDA.  Esoph dilation was done but no stricture present.  Mild portal hypertensive gastropathy, o/w normal.  Duodenal bx NEG.  h pylori neg.   ESOPHAGOGASTRODUODENOSCOPY     09/25/2023 inactive chronic gastritis, h pylor neg. benign   ESOPHAGOGASTRODUODENOSCOPY     09/25/23 hemorrhagic portal gastropathy +  nonbleeding gastric ulcers (H pylori neg)   ESOPHAGOGASTRODUODENOSCOPY (EGD) WITH ESOPHAGEAL DILATION  12/02/2005   ESOPHAGOGASTRODUODENOSCOPY (EGD) WITH PROPOFOL  N/A 08/04/2015   Procedure: ESOPHAGOGASTRODUODENOSCOPY (EGD) WITH PROPOFOL ;  Surgeon: Ruby Corporal, MD;  [facility]: [facility];  Service: Endoscopy;  Laterality: N/A;  procedure 1   ESOPHAGOGASTRODUODENOSCOPY (EGD) WITH PROPOFOL  N/A 09/27/2019   Procedure: ESOPHAGOGASTRODUODENOSCOPY (EGD) WITH PROPOFOL ;  Surgeon: Ruby Corporal, MD;  [facility];  Service: Endoscopy;  Laterality: N/A;  12:10   ESOPHAGOGASTRODUODENOSCOPY (EGD) WITH PROPOFOL  N/A 10/31/2021   Procedure: ESOPHAGOGASTRODUODENOSCOPY (EGD) WITH PROPOFOL ;  Surgeon: Ruby Corporal, MD;  [facility];  Service: Endoscopy;  Laterality: N/A;  930   ESOPHAGOGASTRODUODENOSCOPY (EGD) WITH PROPOFOL  N/A 09/25/2023   Procedure: ESOPHAGOGASTRODUODENOSCOPY (EGD) WITH PROPOFOL ;  Surgeon: Umberto Ganong, Bearl Limes, MD;  [facility];  Service: Gastroenterology;  Laterality: N/A;  10:30AM;ASA 3   FLEXIBLE SIGMOIDOSCOPY  01/17/2012   Procedure: FLEXIBLE SIGMOIDOSCOPY;  Surgeon: Ruby Corporal, MD;  [facility];  Service: Endoscopy;  Laterality: N/A;   GIVENS CAPSULE STUDY N/A 08/03/2019   Procedure: GIVENS CAPSULE STUDY (performed for IDA)->some food debris in stomach and small amount of blood.  Surgeon: Ruby Corporal, MD;  [facility];  Service: Endoscopy;  Laterality: N/A;  730AM   HEMILAMINOTOMY LUMBAR SPINE Bilateral 09/07/1999   L4-5   HOT HEMOSTASIS  09/25/2023   Procedure: HOT HEMOSTASIS (ARGON PLASMA COAGULATION/BICAP);  Surgeon: Umberto Ganong, Bearl Limes, MD;  [facility];  Service: Gastroenterology;;   KNEE ARTHROSCOPY Right 01/1999; 10/2000   LEFT ATRIAL APPENDAGE OCCLUSION N/A 01/23/2023   Procedure: LEFT ATRIAL APPENDAGE OCCLUSION;  Surgeon: Arnoldo Lapping, MD;  Location[facility]AS[facility] CV LAB;  Service:  Cardiovascular;  Laterality: N/A;   LEFT ATRIAL APPENDAGE OCCLUSION     LYSIS OF ADHESION  02/10/2001   MALONEY DILATION  09/27/2019   Procedure: MALONEY DILATION;  Surgeon: Ruby Corporal, MD;  [facility];  Service: Endoscopy;;   NASAL ENDOSCOPY WITH EPISTAXIS CONTROL Bilateral 01/25/2022   Procedure: NASAL ENDOSCOPY WITH EPISTAXIS CONTROL;  Surgeon: Ammon Bales, MD;  Location: [facility] OR;  Service: ENT;  Laterality: Bilateral;   NASAL SEPTOPLASTY W/ TURBINOPLASTY Bilateral 01/25/2022   Procedure: NASAL SEPTOPLASTY WITH TURBINATE REDUCTION;  Surgeon: Ammon Bales, MD;  Location: [facility] OR;  Service: ENT;  Laterality: Bilateral;   RECTOCELE REPAIR  1990; 09/12/2006   TARSAL TUNNEL RELEASE  2002   TEE WITHOUT CARDIOVERSION N/A 01/23/2023   Procedure: TRANSESOPHAGEAL ECHOCARDIOGRAM;  Surgeon: Arnoldo Lapping, MD;  Location: [facility] INVASIVE CV LAB;  Service: Cardiovascular;  Laterality: N/A;   TRANSESOPHAGEAL ECHOCARDIOGRAM (CATH LAB) N/A 08/26/2023   4mm leak around watchman device. Procedure: TRANSESOPHAGEAL ECHOCARDIOGRAM;  Surgeon: Euell Herrlich, MD;  Location: [facility] INVASIVE CV LAB;  Service: Cardiovascular;  Laterality: N/A;   TRANSTHORACIC ECHOCARDIOGRAM  08/04/2015   EF 60-65%, normal wall motion, mild LVH, mild LA dilation, grd I DD.  11/2023 overall poor quality, EF 55-60%   TUMOR EXCISION Left 03/21/2003   dorsal 1st web space (hand)   URETEROLYSIS Right 02/10/2001   Patient Active Problem List   Diagnosis Date Noted   Acute on chronic diastolic CHF (congestive heart failure) (HCC) 10/31/2023   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 08/26/2023   Atrial fibrillation (HCC) 01/23/2023   Gastroenteritis, infectious, presumed 11/14/2022   Infectious colitis 11/14/2022   Acute metabolic encephalopathy 10/09/2022   Back pain 10/09/2022   Diabetes mellitus (HCC) 10/09/2022   Hypoglycemia 10/09/2022   IBS (irritable bowel syndrome)    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   Diarrhea 10/03/2020   Cirrhosis, nonalcoholic (HCC) 10/03/2020   Fecal urgency 08/02/2020   Deviated septum 07/28/2020   Epistaxis, recurrent 05/15/2020   Ulcerative colitis without complications (HCC) 08/24/2019   Gastrointestinal hemorrhage 08/24/2019   Iron  deficiency anemia due to chronic blood loss 07/28/2019   Iron  deficiency anemia 05/20/2019   Heme positive stool 05/20/2019   History of iron  deficiency anemia 12/2018   Vertigo 12/04/2015   Uncontrolled type 2 diabetes mellitus with peripheral neuropathy 11/14/2015   OSA (obstructive sleep apnea), severe 09/14/2015   B12 deficiency 08/17/2015   Vitamin B6 deficiency 08/17/2015   PAF (paroxysmal atrial fibrillation) (HCC) 08/17/2015   Chronic anticoagulation 08/17/2015  Atrial fibrillation with RVR (HCC) 08/04/2015   Dysphagia    Gastroesophageal reflux disease without esophagitis    Lymphocytosis 07/04/2015   Obesity 05/18/2014   Allergic sinusitis 12/09/2013   Myalgia 04/28/2012   Polyarthralgia 04/28/2012   Elevated LFTs 01/16/2012   Insomnia 01/12/2012   Small fiber neuropathy 05/30/2011   Anxiety 05/30/2011   Dyslipidemia 01/24/2011   PALPITATIONS, RECURRENT 08/08/2010   Essential hypertension 04/12/2010   GERD 04/12/2010   Ulcerative colitis (HCC) 04/12/2010    PCP: Mandy Second, PA  REFERRING PROVIDER: Mandy Second, PA  REFERRING DIAG: I87.2 (ICD-10-CM) - Venous insufficiency of both lower extremities I89.0,E66.9 (ICD-10-CM) - Lymphedema associated with obesity I87.2 (ICD-10-CM) - Venous stasis dermatitis of both lower extremities  THERAPY DIAG:  Lymphedema, not elsewhere classified  Rationale for Evaluation and Treatment: Rehabilitation  ONSET DATE: chronic but has increased significantly in the past year.   SUBJECTIVE:                                                                                                                                                                                            SUBJECTIVE STATEMENT: Pt states that [facility] needs recent measurements.  PERTINENT HISTORY:  [name] is a 78 y.o. female with past medical history significant for chronic diastolic congestive heart failure, proximal atrial fibrillation not on anticoagulation due to recurrent epistaxis s/p Watchman procedure (12/2022), CAD on aspirin , type 2 diabetes mellitus, anxiety/depression, nonalcoholic cirrhosis, OSA on CPAP, morbid obesity hospitalized for acute CHF from 10/31/23-11/04/2574 .     PAIN:  Are you having pain?   NPRS scale: 0/10  PRECAUTIONS: Other: cellulitis    RED FLAGS: None   WEIGHT BEARING RESTRICTIONS: No  FALLS:  Has patient fallen in last 6 months? No  LIVING ENVIRONMENT: Lives with: lives with their family Lives in: House/apartment Stairs: Yes: Internal: 2 steps; none and External: 2 steps; none Has following equipment at home: Quad cane small base  PATIENT GOALS: less swelling    OBJECTIVE: Note: Objective measures were completed at Evaluation unless otherwise noted.  COGNITION: Overall cognitive status: Within functional limits for tasks assessed   PALPATION: Increased induration greater in LE but noted in thigh area as well   OBSERVATIONS / OTHER ASSESSMENTS: slight redness and thickening of skin   POSTURE: forward head, rounded shoulders and increased kyphosis  All within normal limits:   LYMPHEDEMA ASSESSMENTS:    LE LANDMARK RIGHT eval Right 01/29/24 02/04/24 02/18/24 02/26/24 03/03/24  At groin        30 cm proximal to suprapatella        20 cm proximal  to suprapatella   63.7 64 63.2 63.2    10 cm proximal to suprapatella   61.9 63.5 59.5 58.9  At midpatella / popliteal crease 52.5 46.3 49.5 49.8 46.7 48  30 cm proximal to floor at lateral plantar foot 53.5 45.3 46.8 47.3 45.7 45.5  20 cm proximal to floor at lateral plantar foot 44  36.6 38.7 37.5 35.8 35.1  10 cm proximal to floor at lateral  plantar foot 29 27.2 27.7 27.5 26.8 26.9  Circumference of ankle/heel 33.5 33.4 34 33.3 32.8 33  5 cm proximal to 1st MTP joint 24 24 23.8 23.2 23.3 22.8  Across MTP joint 23 22.8 23 24  22.8 22.3  Around proximal great toe        (Blank rows = not tested)  LE LANDMARK LEFT eval Left 01/29/24 02/04/24/ 02/18/24 02/26/24 02/21/24  At groin        30 cm proximal to suprapatella        20 cm proximal to suprapatella   66 63.5 63.5 63.5  10 cm proximal to suprapatella   63 63.8 61.8 60  At midpatella / popliteal crease 53 47 48.5 50.2 46.8 47  30 cm proximal to floor at lateral plantar foot 54.2 50.2 49.4 47 47.2 46  20 cm proximal to floor at lateral plantar foot 40.5 40.5 38.5 40.7 40 38.5  10 cm proximal to floor at lateral plantar foot 32.3 29.5 30.3 30 29.5 29  Circumference of ankle/heel 35.4 34 34.7 33.7 34.6 34.8  5 cm proximal to 1st MTP joint 24.3 23.6 23.2 23.5 23.1 23  Across MTP joint 23.3 22.5 22.7 22.9 22.5 22.2  Around proximal great toe        (Blank rows = not tested)  FUNCTIONAL TESTS:  Gait : 1:30 minutes to ambulate with quad cane x 80 ft    TODAY'S TREATMENT:                                                                                                                              DATE:  03/03/24: measurement PT received manual to include short neck, deep and superficial abdominal, inguinal/axillary anastomosis as well as anterior LE B Bandaging:  full LE's bilaterally  with 1/2" foam and multiple layers of short stretch bandaging  02/26/2024:  PT received manual to include short neck, inguinal/axillary anastomosis as well as anterior LE B (abdominal withheld due to flare up of IBS.  Compression bandaging from toes to thigh using 1/2 " foam and multilayer short stretch bandaging . Continue decreased induration in B  thighs  today.    PATIENT EDUCATION:  Education details: skin care recommending using Eucerin or Aquaphor, exercise to include ankle pumps, LAQ, hip  ab/adduction, marching and diaphragm breath, what manual will be like as well as multilayer compression dressing.  Person educated: Patient and Spouse Education method: Explanation, Verbal cues, and Handouts Education comprehension: verbalized understanding and returned demonstration  HOME  EXERCISE PROGRAM: ankle pumps, LAQ, hip ab/adduction, marching and diaphragm breath  ASSESSMENT:  CLINICAL IMPRESSION:   Pt has continued to exercise, elevate and use her compression daily as she has for the past 6 weeks.  Pt measured today with minimal change in measurement.  Therapist faxed most recent measurements into [facility] for juxtafit.  Pt will benefit from continued skilled therapy for total decongestive techniques until pt has compression garment and pump.       OBJECTIVE IMPAIRMENTS: cardiopulmonary status limiting activity, decreased activity tolerance, decreased mobility, difficulty walking, decreased ROM, decreased strength, increased edema, and obesity.   ACTIVITY LIMITATIONS: carrying, lifting, stairs, bathing, dressing, and locomotion level  PARTICIPATION LIMITATIONS: shopping and community activity  PERSONAL FACTORS: Age, Fitness, Time since onset of injury/illness/exacerbation, and 1-2 comorbidities: CHF,  are also affecting patient's functional outcome.   REHAB POTENTIAL: Good  CLINICAL DECISION MAKING: Evolving/moderate complexity  EVALUATION COMPLEXITY: Moderate  GOALS: Goals reviewed with patient? Yes  SHORT TERM GOALS: Target date: 02/10/24  PT to be I in HEP to improve lymphatic circulation Baseline: Goal status: met  2.  PT to have lost 1-2 cm in her feet to be able to fit in her shoes, 2-3 cm in legs for improved skin integrity. Baseline:  Goal status: met   LONG TERM GOALS: Target date: 03/02/24  PT to have lost 2 -3 cm in her feet, 3-5 cm in her LE for decreased risk of cellulitis.  Baseline:  Goal status: on-going   2.  Pt to have aqcuired  juxtafit compression garment and be able to don to be able to continue maintenance phase of treatment.  Baseline:  Goal status: on-going   3.  PT to be I in self manual to assist in increasing lymphatic circulation Baseline:  Goal status: on-going   PLAN:  PT FREQUENCY: 3x/week  PT DURATION: 6 weeks  PLANNED INTERVENTIONS: 97110-Therapeutic exercises, 97535- Self Care, and 16109- Manual therapy  PLAN FOR NEXT SESSION: Continue  total decongestive techniques.  Leodis Rainwater, PT CLT [phone]  [facility] Ph: [phone]

## 2024-03-05 ENCOUNTER — Encounter (HOSPITAL_COMMUNITY): Admitting: Physical Therapy

## 2024-03-08 ENCOUNTER — Telehealth (INDEPENDENT_AMBULATORY_CARE_PROVIDER_SITE_OTHER): Payer: Self-pay | Admitting: Gastroenterology

## 2024-03-08 ENCOUNTER — Ambulatory Visit (HOSPITAL_COMMUNITY): Admitting: Physical Therapy

## 2024-03-08 DIAGNOSIS — I890 Lymphedema, not elsewhere classified: Secondary | ICD-10-CM | POA: Diagnosis not present

## 2024-03-08 NOTE — Telephone Encounter (Signed)
 Pt called in and states she is having a flare up of her Ulcerative Colitis. Pt states she is having blood in stool. Has not been able to take Remicaid for 6 months due to being on antibiotic. I asked pt how long she had been on ABT and she states "I just recently came off of it". Pt having nausea and lower abdominal pain mostly on right side. Pt also wanted to know about Cymbalta . Pt states she saw that advertised and the advertisement stated Cymbalta  was an alternative for UC. Pt last seen in office 05/08/23. Please advise. Thank you  ([facility])

## 2024-03-08 NOTE — Therapy (Signed)
 OUTPATIENT PHYSICAL THERAPY LYMPHEDEMA TREATMENT  Patient Name: [name] MRN: [id number] DOB:11-10-1945, 78 y.o., female Today's Date: 03/08/2024    END OF SESSION:  PT End of Session - 03/08/24 1640     Visit Number 16    Number of Visits 18    Date for PT Re-Evaluation 03/02/24    Authorization Type [employer]    Authorization Time Period 18 visits from 4/24-6/3 (request sent 6/2 to add 2 more visits for total of 20 and extend cert date to 6/23)    Authorization - Number of Visits 18    Progress Note Due on Visit 18    PT Start Time 1404    PT Stop Time 1515    PT Time Calculation (min) 71 min    Activity Tolerance Patient tolerated treatment well    Behavior During Therapy [facility] for tasks assessed/performed                  Past Medical History:  Diagnosis Date   Carpal tunnel syndrome of right wrist 03/2013   recurrent   Chronic diastolic heart failure (HCC)    Cirrhosis, 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 ? to get liver bx if she gets bariatric surgery? Mild portal hypertensive gastropathy on EGD 08/2019.   Fibromyalgia    GAD (generalized anxiety disorder)    GERD    History of hiatal hernia    History of iron  deficiency anemia 12/2018   Inadequate absorption secondary to chronic/long term PPI therapy + portal hypertensive gastroduodenopathy. No GIB found on EGD, colonosc, and givens. Iron  infusions X multiple.   History of thrombocytopenia 12/2011   Hyperlipidemia    Intolerant of statins   HYPERTENSION    IBS (irritable bowel syndrome)    -D.  Good response to bentyl  and imodium  as of 06/2018 GI f/u.   IDDM (insulin  dependent diabetes mellitus)    with DPN (managed by Dr. Aldona Amel but then in 2018 pt preferred to have me manage for her convenience)   Limited mobility    Requires a walker for arthritic pain, widespread musculoskeletal pain, and neuropathic pain.   Morbid obesity (HCC)    As of 11/2018,  pt considering sleeve gastectomy vs bipass as of eval by Dr. Molli Angelucci considering as of 12/2019.   Nonalcoholic steatohepatitis    Viral Hep screens NEG.  CT 2015.  Transaminasemia.  U/S 07/2018 showed early changes of cirrhosis.   OSA (obstructive sleep apnea) 09/14/2015   sleep study 09/07/15: severe obstructive sleep apnea with an AHI of 72 and SaO2 low of 75%.>referred to sleep MD   Osteoarthritis    hips, shoulders, knees   PAF (paroxysmal atrial fibrillation) (HCC)    One documented episode (after getting EGD 2016).  Was on amiodarone  x 3 mo.  Rate control with metoprolol  + anticoag with xarelto . Watchman 12/2022, off anticoag   Portal hypertensive gastropathy (HCC)    hemorrhagic gastropathy + non-bleeding gastric ulcers on EGD 08/2023   Recurrent epistaxis    Granuloma in L nare cauterized by ENT 04/2020. Another cautery 06/2020   Small fiber neuropathy    Due to DM.  Symmetric hands and feet tingling/numbness.   Ulcerative colitis (HCC)    Remicade  infusion Q 8 weeks: in clinical and endoscopic remission as of 12/2018 GI f/u.  06/11/19 rpt colonoscopy->cecal and ascending colon colitis.   Past Surgical History:  Procedure Laterality Date   ABDOMINAL HYSTERECTOMY  1980  Paps no longer indicated.   BACK SURGERY     BACTERIAL OVERGROWTH TEST N/A 07/13/2015   Procedure: BACTERIAL OVERGROWTH TEST;  Surgeon: Ruby Corporal, MD;  [facility];  Service: Endoscopy;  Laterality: N/A;  730     BILATERAL SALPINGOOPHORECTOMY  02/10/2001   BIOPSY  06/11/2019   Procedure: BIOPSY;  Surgeon: Ruby Corporal, MD;  [facility];  Service: Endoscopy;;  colon   BIOPSY  09/27/2019   Procedure: BIOPSY;  Surgeon: Ruby Corporal, MD;  [facility];  Service: Endoscopy;;  gastric duodenal   BIOPSY  09/15/2020   Procedure: BIOPSY;  Surgeon: Urban Garden, MD;  [facility];  Service: Gastroenterology;;   BIOPSY  09/25/2023   Procedure:  BIOPSY;  Surgeon: Urban Garden, MD;  [facility];  Service: Gastroenterology;;   BREAST REDUCTION SURGERY  1994   bilat   BREAST SURGERY     CARDIOVASCULAR STRESS TEST  07/2010   Lexiscan myoview: normal   CARPAL TUNNEL RELEASE Right 1996   CARPAL TUNNEL RELEASE Left 03/21/2003   CARPAL TUNNEL RELEASE Right 05/04/2013   Procedure: CARPAL TUNNEL RELEASE;  Surgeon: Amelie Baize., MD;  Location: [facility];  Service: Orthopedics;  Laterality: Right;   CARPAL TUNNEL RELEASE Left 09/21/2013   Procedure: LEFT CARPAL TUNNEL RELEASE;  Surgeon: Amelie Baize., MD;  Location: [facility];  Service: Orthopedics;  Laterality: Left;   CHOLECYSTECTOMY     COLONOSCOPY WITH PROPOFOL  N/A 08/04/2015   Colitis in remission.  No polyps.  Procedure: COLONOSCOPY WITH PROPOFOL ;  Surgeon: Ruby Corporal, MD;  [facility]: [facility];  Service: Endoscopy;  Laterality: N/A;  cecum time in  0820   time out  0827    total time 7 minutes   COLONOSCOPY WITH PROPOFOL  N/A 06/11/2019   cecal and ascending colon colitis.  Procedure: COLONOSCOPY WITH PROPOFOL ;  Surgeon: Ruby Corporal, MD;  [facility];  Service: Endoscopy;  Laterality: N/A;  730a   COLONOSCOPY WITH PROPOFOL  N/A 09/15/2020   Procedure: COLONOSCOPY WITH PROPOFOL ;  Surgeon: Urban Garden, MD;  [facility];  Service: Gastroenterology;  Laterality: N/A;  1030   ESOPHAGEAL DILATION N/A 08/04/2015   Procedure: ESOPHAGEAL DILATION;  Surgeon: Ruby Corporal, MD;  Location: [facility];  Service: Endoscopy;  Laterality: N/AFelicita Horns, no mucousal disruption   ESOPHAGOGASTRODUODENOSCOPY  09/27/2019   Performed for IDA.  Esoph dilation was done but no stricture present.  Mild portal hypertensive gastropathy, o/w normal.  Duodenal bx NEG.  h pylori neg.   ESOPHAGOGASTRODUODENOSCOPY     09/25/2023 inactive chronic gastritis, h pylor neg. benign   ESOPHAGOGASTRODUODENOSCOPY      09/25/23 hemorrhagic portal gastropathy + nonbleeding gastric ulcers (H pylori neg)   ESOPHAGOGASTRODUODENOSCOPY (EGD) WITH ESOPHAGEAL DILATION  12/02/2005   ESOPHAGOGASTRODUODENOSCOPY (EGD) WITH PROPOFOL  N/A 08/04/2015   Procedure: ESOPHAGOGASTRODUODENOSCOPY (EGD) WITH PROPOFOL ;  Surgeon: Ruby Corporal, MD;  [facility]: [facility];  Service: Endoscopy;  Laterality: N/A;  procedure 1   ESOPHAGOGASTRODUODENOSCOPY (EGD) WITH PROPOFOL  N/A 09/27/2019   Procedure: ESOPHAGOGASTRODUODENOSCOPY (EGD) WITH PROPOFOL ;  Surgeon: Ruby Corporal, MD;  [facility];  Service: Endoscopy;  Laterality: N/A;  12:10   ESOPHAGOGASTRODUODENOSCOPY (EGD) WITH PROPOFOL  N/A 10/31/2021   Procedure: ESOPHAGOGASTRODUODENOSCOPY (EGD) WITH PROPOFOL ;  Surgeon: Ruby Corporal, MD;  [facility];  Service: Endoscopy;  Laterality: N/A;  930   ESOPHAGOGASTRODUODENOSCOPY (  EGD) WITH PROPOFOL  N/A 09/25/2023   Procedure: ESOPHAGOGASTRODUODENOSCOPY (EGD) WITH PROPOFOL ;  Surgeon: Urban Garden, MD;  [facility];  Service: Gastroenterology;  Laterality: N/A;  10:30AM;ASA 3   FLEXIBLE SIGMOIDOSCOPY  01/17/2012   Procedure: FLEXIBLE SIGMOIDOSCOPY;  Surgeon: Ruby Corporal, MD;  [facility];  Service: Endoscopy;  Laterality: N/A;   GIVENS CAPSULE STUDY N/A 08/03/2019   Procedure: GIVENS CAPSULE STUDY (performed for IDA)->some food debris in stomach and small amount of blood.  Surgeon: Ruby Corporal, MD;  [facility];  Service: Endoscopy;  Laterality: N/A;  730AM   HEMILAMINOTOMY LUMBAR SPINE Bilateral 09/07/1999   L4-5   HOT HEMOSTASIS  09/25/2023   Procedure: HOT HEMOSTASIS (ARGON PLASMA COAGULATION/BICAP);  Surgeon: Umberto Ganong, Bearl Limes, MD;  [facility];  Service: Gastroenterology;;   KNEE ARTHROSCOPY Right 01/1999; 10/2000   LEFT ATRIAL APPENDAGE OCCLUSION N/A 01/23/2023   Procedure: LEFT ATRIAL APPENDAGE OCCLUSION;  Surgeon: Arnoldo Lapping, MD;   Location: [facility]AS[facility] CV [facility];  Service: Cardiovascular;  Laterality: N/A;   LEFT ATRIAL APPENDAGE OCCLUSION     LYSIS OF ADHESION  02/10/2001   MALONEY DILATION  09/27/2019   Procedure: MALONEY DILATION;  Surgeon: Ruby Corporal, MD;  [facility];  Service: Endoscopy;;   NASAL ENDOSCOPY WITH EPISTAXIS CONTROL Bilateral 01/25/2022   Procedure: NASAL ENDOSCOPY WITH EPISTAXIS CONTROL;  Surgeon: Ammon Bales, MD;  Location: [facility] OR;  Service: ENT;  Laterality: Bilateral;   NASAL SEPTOPLASTY W/ TURBINOPLASTY Bilateral 01/25/2022   Procedure: NASAL SEPTOPLASTY WITH TURBINATE REDUCTION;  Surgeon: Ammon Bales, MD;  Location: [facility] OR;  Service: ENT;  Laterality: Bilateral;   RECTOCELE REPAIR  1990; 09/12/2006   TARSAL TUNNEL RELEASE  2002   TEE WITHOUT CARDIOVERSION N/A 01/23/2023   Procedure: TRANSESOPHAGEAL ECHOCARDIOGRAM;  Surgeon: Arnoldo Lapping, MD;  Location: [facility] CV [facility];  Service: Cardiovascular;  Laterality: N/A;   TRANSESOPHAGEAL ECHOCARDIOGRAM (CATH LAB) N/A 08/26/2023   4mm leak around watchman device. Procedure: TRANSESOPHAGEAL ECHOCARDIOGRAM;  Surgeon: Euell Herrlich, MD;  Location: [facility] INVASIVE CV LAB;  Service: Cardiovascular;  Laterality: N/A;   TRANSTHORACIC ECHOCARDIOGRAM  08/04/2015   EF 60-65%, normal wall motion, mild LVH, mild LA dilation, grd I DD.  11/2023 overall poor quality, EF 55-60%   TUMOR EXCISION Left 03/21/2003   dorsal 1st web space (hand)   URETEROLYSIS Right 02/10/2001   Patient Active Problem List   Diagnosis Date Noted   Acute on chronic diastolic CHF (congestive heart failure) (HCC) 10/31/2023   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 08/26/2023   Atrial fibrillation (HCC) 01/23/2023   Gastroenteritis, infectious, presumed 11/14/2022   Infectious colitis 11/14/2022   Acute metabolic encephalopathy 10/09/2022   Back pain 10/09/2022   Diabetes mellitus (HCC) 10/09/2022   Hypoglycemia 10/09/2022   IBS (irritable  bowel syndrome)    Chronic ulcerative pancolitis (HCC) 08/21/2021   Diarrhea 10/03/2020   Cirrhosis, nonalcoholic (HCC) 10/03/2020   Fecal urgency 08/02/2020   Deviated septum 07/28/2020   Epistaxis, recurrent 05/15/2020   Ulcerative colitis without complications (HCC) 08/24/2019   Gastrointestinal hemorrhage 08/24/2019   Iron  deficiency anemia due to chronic blood loss 07/28/2019   Iron  deficiency anemia 05/20/2019   Heme positive stool 05/20/2019   History of iron  deficiency anemia 12/2018   Vertigo 12/04/2015   Uncontrolled type 2 diabetes mellitus with peripheral neuropathy 11/14/2015   OSA (obstructive sleep apnea), severe 09/14/2015   B12 deficiency 08/17/2015   Vitamin B6 deficiency 08/17/2015  PAF (paroxysmal atrial fibrillation) (HCC) 08/17/2015   Chronic anticoagulation 08/17/2015   Atrial fibrillation with RVR (HCC) 08/04/2015   Dysphagia    Gastroesophageal reflux disease without esophagitis    Lymphocytosis 07/04/2015   Obesity 05/18/2014   Allergic sinusitis 12/09/2013   Myalgia 04/28/2012   Polyarthralgia 04/28/2012   Elevated LFTs 01/16/2012   Insomnia 01/12/2012   Small fiber neuropathy 05/30/2011   Anxiety 05/30/2011   Dyslipidemia 01/24/2011   PALPITATIONS, RECURRENT 08/08/2010   Essential hypertension 04/12/2010   GERD 04/12/2010   Ulcerative colitis (HCC) 04/12/2010    PCP: Mandy Second, PA  REFERRING PROVIDER: Mandy Second, PA  REFERRING DIAG: I87.2 (ICD-10-CM) - Venous insufficiency of both lower extremities I89.0,E66.9 (ICD-10-CM) - Lymphedema associated with obesity I87.2 (ICD-10-CM) - Venous stasis dermatitis of both lower extremities  THERAPY DIAG:  Lymphedema, not elsewhere classified  Rationale for Evaluation and Treatment: Rehabilitation  ONSET DATE: chronic but has increased significantly in the past year.   SUBJECTIVE:                                                                                                                                                                                            SUBJECTIVE STATEMENT: Pt reports no news or issues.   PERTINENT HISTORY:  JANNEL LYNNE is a 78 y.o. female with past medical history significant for chronic diastolic congestive heart failure, proximal atrial fibrillation not on anticoagulation due to recurrent epistaxis s/p Watchman procedure (12/2022), CAD on aspirin , type 2 diabetes mellitus, anxiety/depression, nonalcoholic cirrhosis, OSA on CPAP, morbid obesity hospitalized for acute CHF from 10/31/23-11/04/2574 .     PAIN:  Are you having pain?   NPRS scale: 0/10  PRECAUTIONS: Other: cellulitis    RED FLAGS: None   WEIGHT BEARING RESTRICTIONS: No  FALLS:  Has patient fallen in last 6 months? No  LIVING ENVIRONMENT: Lives with: lives with their family Lives in: House/apartment Stairs: Yes: Internal: 2 steps; none and External: 2 steps; none Has following equipment at home: Quad cane small base  PATIENT GOALS: less swelling    OBJECTIVE: Note: Objective measures were completed at Evaluation unless otherwise noted.  COGNITION: Overall cognitive status: Within functional limits for tasks assessed   PALPATION: Increased induration greater in LE but noted in thigh area as well   OBSERVATIONS / OTHER ASSESSMENTS: slight redness and thickening of skin   POSTURE: forward head, rounded shoulders and increased kyphosis  All within normal limits:   LYMPHEDEMA ASSESSMENTS:    LE LANDMARK RIGHT eval Right 01/29/24 02/04/24 02/18/24 02/26/24 03/03/24  At groin        30 cm proximal  to suprapatella        20 cm proximal to suprapatella   63.7 64 63.2 63.2    10 cm proximal to suprapatella   61.9 63.5 59.5 58.9  At midpatella / popliteal crease 52.5 46.3 49.5 49.8 46.7 48  30 cm proximal to floor at lateral plantar foot 53.5 45.3 46.8 47.3 45.7 45.5  20 cm proximal to floor at lateral plantar foot 44  36.6 38.7 37.5 35.8 35.1  10 cm proximal to floor  at lateral plantar foot 29 27.2 27.7 27.5 26.8 26.9  Circumference of ankle/heel 33.5 33.4 34 33.3 32.8 33  5 cm proximal to 1st MTP joint 24 24 23.8 23.2 23.3 22.8  Across MTP joint 23 22.8 23 24  22.8 22.3  Around proximal great toe        (Blank rows = not tested)  LE LANDMARK LEFT eval Left 01/29/24 02/04/24/ 02/18/24 02/26/24 02/21/24  At groin        30 cm proximal to suprapatella        20 cm proximal to suprapatella   66 63.5 63.5 63.5  10 cm proximal to suprapatella   63 63.8 61.8 60  At midpatella / popliteal crease 53 47 48.5 50.2 46.8 47  30 cm proximal to floor at lateral plantar foot 54.2 50.2 49.4 47 47.2 46  20 cm proximal to floor at lateral plantar foot 40.5 40.5 38.5 40.7 40 38.5  10 cm proximal to floor at lateral plantar foot 32.3 29.5 30.3 30 29.5 29  Circumference of ankle/heel 35.4 34 34.7 33.7 34.6 34.8  5 cm proximal to 1st MTP joint 24.3 23.6 23.2 23.5 23.1 23  Across MTP joint 23.3 22.5 22.7 22.9 22.5 22.2  Around proximal great toe        (Blank rows = not tested)  FUNCTIONAL TESTS:  Gait : 1:30 minutes to ambulate with quad cane x 80 ft    TODAY'S TREATMENT:                                                                                                                              DATE:  03/08/24: Manual:  PT received manual to include short neck, deep and superficial abdominal, inguinal/axillary anastomosis as well as anterior LE B Bandaging:  full LE's bilaterally  with 1/2" foam and multiple layers of short stretch bandaging  03/03/24: measurement PT received manual to include short neck, deep and superficial abdominal, inguinal/axillary anastomosis as well as anterior LE B Bandaging:  full LE's bilaterally  with 1/2" foam and multiple layers of short stretch bandaging  02/26/2024:  PT received manual to include short neck, inguinal/axillary anastomosis as well as anterior LE B (abdominal withheld due to flare up of IBS.  Compression bandaging from toes to  thigh using 1/2 " foam and multilayer short stretch bandaging . Continue decreased induration in B  thighs  today.    PATIENT EDUCATION:  Education details: skin  care recommending using Eucerin or Aquaphor, exercise to include ankle pumps, LAQ, hip ab/adduction, marching and diaphragm breath, what manual will be like as well as multilayer compression dressing.  Person educated: Patient and Spouse Education method: Explanation, Verbal cues, and Handouts Education comprehension: verbalized understanding and returned demonstration  HOME EXERCISE PROGRAM: ankle pumps, LAQ, hip ab/adduction, marching and diaphragm breath  ASSESSMENT:  CLINICAL IMPRESSION:  Pt with little induration today.  Continues to have a lot of dryness bilateral LE, especially distally.  Moisturized well prior to rebandaging.  Pt or spouse have not heard from garment or pump company this morning.  Pt will benefit from continued skilled therapy for total decongestive techniques until pt has compression garment and pump.       OBJECTIVE IMPAIRMENTS: cardiopulmonary status limiting activity, decreased activity tolerance, decreased mobility, difficulty walking, decreased ROM, decreased strength, increased edema, and obesity.   ACTIVITY LIMITATIONS: carrying, lifting, stairs, bathing, dressing, and locomotion level  PARTICIPATION LIMITATIONS: shopping and community activity  PERSONAL FACTORS: Age, Fitness, Time since onset of injury/illness/exacerbation, and 1-2 comorbidities: CHF,  are also affecting patient's functional outcome.   REHAB POTENTIAL: Good  CLINICAL DECISION MAKING: Evolving/moderate complexity  EVALUATION COMPLEXITY: Moderate  GOALS: Goals reviewed with patient? Yes  SHORT TERM GOALS: Target date: 02/10/24  PT to be I in HEP to improve lymphatic circulation Baseline: Goal status: met  2.  PT to have lost 1-2 cm in her feet to be able to fit in her shoes, 2-3 cm in legs for improved skin  integrity. Baseline:  Goal status: met   LONG TERM GOALS: Target date: 03/02/24  PT to have lost 2 -3 cm in her feet, 3-5 cm in her LE for decreased risk of cellulitis.  Baseline:  Goal status: on-going   2.  Pt to have aqcuired juxtafit compression garment and be able to don to be able to continue maintenance phase of treatment.  Baseline:  Goal status: on-going   3.  PT to be I in self manual to assist in increasing lymphatic circulation Baseline:  Goal status: on-going   PLAN:  PT FREQUENCY: 3x/week  PT DURATION: 6 weeks  PLANNED INTERVENTIONS: 97110-Therapeutic exercises, 97535- Self Care, and 01027- Manual therapy  PLAN FOR NEXT SESSION: Continue  total decongestive techniques.   Lorenso Romance, PTA/CLT [facility] Ph: [phone]   Lorenso Romance, PTA 03/08/2024, 4:49 PM

## 2024-03-08 NOTE — Telephone Encounter (Signed)
 [name] can you call patient and set up an appointment? Thank you!

## 2024-03-08 NOTE — Telephone Encounter (Signed)
 I called the patient to discuss her symptoms but she did not answer.  Her voicemail box was full and I could not leave a message. Unfortunately, is very likely her ulcerative colitis has flared due to stopping the medication.  She never discussed this with us  and stop the medication while taking an antibiotic.  It is unclear why she had to stop this but this was never discussed with her clinic.  She should follow-up in her office with me or any of the other providers to discuss the next steps and clarify further why she had to stop the Remicade . Cymbalta  is not an option for management of ulcerative colitis.

## 2024-03-09 NOTE — Telephone Encounter (Signed)
 Pt contacted and verbalized understanding. Pt transferred up front to schedule office visit.

## 2024-03-09 NOTE — Telephone Encounter (Signed)
 Pt was contacted to schedule appt and stated that she needed to speak with nurse. Call transferred back to nurse. Pt states she needed to let provider know that every time she had [employer] she would have bruising due to being stuck multiple times and being on antibiotic for so long. Pt states she also had a Watchman implanted. Went over provider message once again and told pt to keep 03/24/24 appointment so we could discuss everything with her. Gave pt appt time/date multiple times. Pt also states that she is unable to get through to us . Gave pt my main number and the office main number multiple times.

## 2024-03-09 NOTE — Addendum Note (Signed)
 Addended by: Adrienne Alberts on: 03/09/2024 03:36 PM   Modules accepted: Orders

## 2024-03-10 ENCOUNTER — Ambulatory Visit (HOSPITAL_COMMUNITY): Admitting: Physical Therapy

## 2024-03-10 DIAGNOSIS — I890 Lymphedema, not elsewhere classified: Secondary | ICD-10-CM | POA: Diagnosis not present

## 2024-03-10 NOTE — Therapy (Signed)
 OUTPATIENT PHYSICAL THERAPY LYMPHEDEMA TREATMENT  Patient Name: [name] MRN: [id number] DOB:[birth year], 78 y.o., female Today's Date: 03/10/2024    END OF SESSION:  PT End of Session - 03/08/24 1640     Visit Number 16    Number of Visits 18    Date for PT Re-Evaluation 03/02/24    Authorization Type [employer]    Authorization Time Period 18 visits approved thru 6/14    Authorization - Number of Visits 16   Progress Note Due on Visit 18    PT Start Time 1404    PT Stop Time 1515    PT Time Calculation (min) 71 min    Activity Tolerance Patient tolerated treatment well    Behavior During Therapy [facility] for tasks assessed/performed                  Past Medical History:  Diagnosis Date   Carpal tunnel syndrome of right wrist 03/2013   recurrent   Chronic diastolic heart failure (HCC)    Cirrhosis, 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 ? to get liver bx if she gets bariatric surgery? Mild portal hypertensive gastropathy on EGD 08/2019.   Fibromyalgia    GAD (generalized anxiety disorder)    GERD    History of hiatal hernia    History of iron  deficiency anemia 12/2018   Inadequate absorption secondary to chronic/long term PPI therapy + portal hypertensive gastroduodenopathy. No GIB found on EGD, colonosc, and givens. Iron  infusions X multiple.   History of thrombocytopenia 12/2011   Hyperlipidemia    Intolerant of statins   HYPERTENSION    IBS (irritable bowel syndrome)    -D.  Good response to bentyl  and imodium  as of 06/2018 GI f/u.   IDDM (insulin  dependent diabetes mellitus)    with DPN (managed by Dr. Aldona Amel but then in 2018 pt preferred to have me manage for her convenience)   Limited mobility    Requires a walker for arthritic pain, widespread musculoskeletal pain, and neuropathic pain.   Morbid obesity (HCC)    As of 11/2018, pt considering sleeve gastectomy vs bipass as of eval by Dr. Molli Angelucci  considering as of 12/2019.   Nonalcoholic steatohepatitis    Viral Hep screens NEG.  CT 2015.  Transaminasemia.  U/S 07/2018 showed early changes of cirrhosis.   OSA (obstructive sleep apnea) 09/14/2015   sleep study 09/07/15: severe obstructive sleep apnea with an AHI of 72 and SaO2 low of 75%.>referred to sleep MD   Osteoarthritis    hips, shoulders, knees   PAF (paroxysmal atrial fibrillation) (HCC)    One documented episode (after getting EGD 2016).  Was on amiodarone  x 3 mo.  Rate control with metoprolol  + anticoag with xarelto . Watchman 12/2022, off anticoag   Portal hypertensive gastropathy (HCC)    hemorrhagic gastropathy + non-bleeding gastric ulcers on EGD 08/2023   Recurrent epistaxis    Granuloma in L nare cauterized by ENT 04/2020. Another cautery 06/2020   Small fiber neuropathy    Due to DM.  Symmetric hands and feet tingling/numbness.   Ulcerative colitis (HCC)    Remicade  infusion Q 8 weeks: in clinical and endoscopic remission as of 12/2018 GI f/u.  06/11/19 rpt colonoscopy->cecal and ascending colon colitis.   Past Surgical History:  Procedure Laterality Date   ABDOMINAL HYSTERECTOMY  1980   Paps no longer indicated.   BACK SURGERY     BACTERIAL OVERGROWTH TEST N/A 07/13/2015  Procedure: BACTERIAL OVERGROWTH TEST;  Surgeon: Ruby Corporal, MD;  [facility];  Service: Endoscopy;  Laterality: N/A;  730     BILATERAL SALPINGOOPHORECTOMY  02/10/2001   BIOPSY  06/11/2019   Procedure: BIOPSY;  Surgeon: Ruby Corporal, MD;  [facility]: [facility];  Service: Endoscopy;;  colon   BIOPSY  09/27/2019   Procedure: BIOPSY;  Surgeon: Ruby Corporal, MD;  [facility];  Service: Endoscopy;;  gastric duodenal   BIOPSY  09/15/2020   Procedure: BIOPSY;  Surgeon: Urban Garden, MD;  [facility];  Service: Gastroenterology;;   BIOPSY  09/25/2023   Procedure: BIOPSY;  Surgeon: Urban Garden, MD;  [facility];   Service: Gastroenterology;;   BREAST REDUCTION SURGERY  1994   bilat   BREAST SURGERY     CARDIOVASCULAR STRESS TEST  07/2010   Lexiscan myoview: normal   CARPAL TUNNEL RELEASE Right 1996   CARPAL TUNNEL RELEASE Left 03/21/2003   CARPAL TUNNEL RELEASE Right 05/04/2013   Procedure: CARPAL TUNNEL RELEASE;  Surgeon: Amelie Baize., MD;  Location: [facility];  Service: Orthopedics;  Laterality: Right;   CARPAL TUNNEL RELEASE Left 09/21/2013   Procedure: LEFT CARPAL TUNNEL RELEASE;  Surgeon: Amelie Baize., MD;  Location: [facility];  Service: Orthopedics;  Laterality: Left;   CHOLECYSTECTOMY     COLONOSCOPY WITH PROPOFOL  N/A 08/04/2015   Colitis in remission.  No polyps.  Procedure: COLONOSCOPY WITH PROPOFOL ;  Surgeon: Ruby Corporal, MD;  Location: [facility];  Service: Endoscopy;  Laterality: N/A;  cecum time in  0820   time out  0827    total time 7 minutes   COLONOSCOPY WITH PROPOFOL  N/A 06/11/2019   cecal and ascending colon colitis.  Procedure: COLONOSCOPY WITH PROPOFOL ;  Surgeon: Ruby Corporal, MD;  [facility];  Service: Endoscopy;  Laterality: N/A;  730a   COLONOSCOPY WITH PROPOFOL  N/A 09/15/2020   Procedure: COLONOSCOPY WITH PROPOFOL ;  Surgeon: Urban Garden, MD;  [facility];  Service: Gastroenterology;  Laterality: N/A;  1030   ESOPHAGEAL DILATION N/A 08/04/2015   Procedure: ESOPHAGEAL DILATION;  Surgeon: Ruby Corporal, MD;  [facility]: [facility];  Service: Endoscopy;  Laterality: N/AFelicita Horns, no mucousal disruption   ESOPHAGOGASTRODUODENOSCOPY  09/27/2019   Performed for IDA.  Esoph dilation was done but no stricture present.  Mild portal hypertensive gastropathy, o/w normal.  Duodenal bx NEG.  h pylori neg.   ESOPHAGOGASTRODUODENOSCOPY     09/25/2023 inactive chronic gastritis, h pylor neg. benign   ESOPHAGOGASTRODUODENOSCOPY     09/25/23 hemorrhagic portal gastropathy + nonbleeding gastric ulcers (H  pylori neg)   ESOPHAGOGASTRODUODENOSCOPY (EGD) WITH ESOPHAGEAL DILATION  12/02/2005   ESOPHAGOGASTRODUODENOSCOPY (EGD) WITH PROPOFOL  N/A 08/04/2015   Procedure: ESOPHAGOGASTRODUODENOSCOPY (EGD) WITH PROPOFOL ;  Surgeon: Ruby Corporal, MD;  [facility]: [facility];  Service: Endoscopy;  Laterality: N/A;  procedure 1   ESOPHAGOGASTRODUODENOSCOPY (EGD) WITH PROPOFOL  N/A 09/27/2019   Procedure: ESOPHAGOGASTRODUODENOSCOPY (EGD) WITH PROPOFOL ;  Surgeon: Ruby Corporal, MD;  [facility];  Service: Endoscopy;  Laterality: N/A;  12:10   ESOPHAGOGASTRODUODENOSCOPY (EGD) WITH PROPOFOL  N/A 10/31/2021   Procedure: ESOPHAGOGASTRODUODENOSCOPY (EGD) WITH PROPOFOL ;  Surgeon: Ruby Corporal, MD;  [facility];  Service: Endoscopy;  Laterality: N/A;  930   ESOPHAGOGASTRODUODENOSCOPY (EGD) WITH PROPOFOL  N/A 09/25/2023   Procedure: ESOPHAGOGASTRODUODENOSCOPY (EGD) WITH PROPOFOL ;  Surgeon: Urban Garden, MD;  [facility];  Service: Gastroenterology;  Laterality: N/A;  10:30AM;ASA 3   FLEXIBLE SIGMOIDOSCOPY  01/17/2012   Procedure: FLEXIBLE SIGMOIDOSCOPY;  Surgeon: Ruby Corporal, MD;  [facility];  Service: Endoscopy;  Laterality: N/A;   GIVENS CAPSULE STUDY N/A 08/03/2019   Procedure: GIVENS CAPSULE STUDY (performed for IDA)->some food debris in stomach and small amount of blood.  Surgeon: Ruby Corporal, MD;  [facility];  Service: Endoscopy;  Laterality: N/A;  730AM   HEMILAMINOTOMY LUMBAR SPINE Bilateral 09/07/1999   L4-5   HOT HEMOSTASIS  09/25/2023   Procedure: HOT HEMOSTASIS (ARGON PLASMA COAGULATION/BICAP);  Surgeon: Umberto Ganong, Bearl Limes, MD;  [facility];  Service: Gastroenterology;;   KNEE ARTHROSCOPY Right 01/1999; 10/2000   LEFT ATRIAL APPENDAGE OCCLUSION N/A 01/23/2023   Procedure: LEFT ATRIAL APPENDAGE OCCLUSION;  Surgeon: Arnoldo Lapping, MD;  Location: [facility];  Service: Cardiovascular;  Laterality: N/A;    LEFT ATRIAL APPENDAGE OCCLUSION     LYSIS OF ADHESION  02/10/2001   MALONEY DILATION  09/27/2019   Procedure: MALONEY DILATION;  Surgeon: Ruby Corporal, MD;  [facility];  Service: Endoscopy;;   NASAL ENDOSCOPY WITH EPISTAXIS CONTROL Bilateral 01/25/2022   Procedure: NASAL ENDOSCOPY WITH EPISTAXIS CONTROL;  Surgeon: Ammon Bales, MD;  Location: [facility];  Service: ENT;  Laterality: Bilateral;   NASAL SEPTOPLASTY W/ TURBINOPLASTY Bilateral 01/25/2022   Procedure: NASAL SEPTOPLASTY WITH TURBINATE REDUCTION;  Surgeon: Ammon Bales, MD;  Location: [facility];  Service: ENT;  Laterality: Bilateral;   RECTOCELE REPAIR  1990; 09/12/2006   TARSAL TUNNEL RELEASE  2002   TEE WITHOUT CARDIOVERSION N/A 01/23/2023   Procedure: TRANSESOPHAGEAL ECHOCARDIOGRAM;  Surgeon: Arnoldo Lapping, MD;  Location: [facility];  Service: Cardiovascular;  Laterality: N/A;   TRANSESOPHAGEAL ECHOCARDIOGRAM (CATH LAB) N/A 08/26/2023   4mm leak around watchman device. Procedure: TRANSESOPHAGEAL ECHOCARDIOGRAM;  Surgeon: Euell Herrlich, MD;  Location: [facility];  Service: Cardiovascular;  Laterality: N/A;   TRANSTHORACIC ECHOCARDIOGRAM  08/04/2015   EF 60-65%, normal wall motion, mild LVH, mild LA dilation, grd I DD.  11/2023 overall poor quality, EF 55-60%   TUMOR EXCISION Left 03/21/2003   dorsal 1st web space (hand)   URETEROLYSIS Right 02/10/2001   Patient Active Problem List   Diagnosis Date Noted   Acute on chronic diastolic CHF (congestive heart failure) (HCC) 10/31/2023   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 08/26/2023   Atrial fibrillation (HCC) 01/23/2023   Gastroenteritis, infectious, presumed 11/14/2022   Infectious colitis 11/14/2022   Acute metabolic encephalopathy 10/09/2022   Back pain 10/09/2022   Diabetes mellitus (HCC) 10/09/2022   Hypoglycemia 10/09/2022   IBS (irritable bowel syndrome)    Chronic ulcerative pancolitis (HCC) 08/21/2021    Diarrhea 10/03/2020   Cirrhosis, nonalcoholic (HCC) 10/03/2020   Fecal urgency 08/02/2020   Deviated septum 07/28/2020   Epistaxis, recurrent 05/15/2020   Ulcerative colitis without complications (HCC) 08/24/2019   Gastrointestinal hemorrhage 08/24/2019   Iron  deficiency anemia due to chronic blood loss 07/28/2019   Iron  deficiency anemia 05/20/2019   Heme positive stool 05/20/2019   History of iron  deficiency anemia 12/2018   Vertigo 12/04/2015   Uncontrolled type 2 diabetes mellitus with peripheral neuropathy 11/14/2015   OSA (obstructive sleep apnea), severe 09/14/2015   B12 deficiency 08/17/2015   Vitamin B6 deficiency 08/17/2015   PAF (paroxysmal atrial fibrillation) (HCC) 08/17/2015   Chronic anticoagulation 08/17/2015   Atrial fibrillation with RVR (  HCC) 08/04/2015   Dysphagia    Gastroesophageal reflux disease without esophagitis    Lymphocytosis 07/04/2015   Obesity 05/18/2014   Allergic sinusitis 12/09/2013   Myalgia 04/28/2012   Polyarthralgia 04/28/2012   Elevated LFTs 01/16/2012   Insomnia 01/12/2012   Small fiber neuropathy 05/30/2011   Anxiety 05/30/2011   Dyslipidemia 01/24/2011   PALPITATIONS, RECURRENT 08/08/2010   Essential hypertension 04/12/2010   GERD 04/12/2010   Ulcerative colitis (HCC) 04/12/2010    PCP: Mandy Second, PA  REFERRING PROVIDER: Mandy Second, PA  REFERRING DIAG: I87.2 (ICD-10-CM) - Venous insufficiency of both lower extremities I89.0,E66.9 (ICD-10-CM) - Lymphedema associated with obesity I87.2 (ICD-10-CM) - Venous stasis dermatitis of both lower extremities  THERAPY DIAG:  Lymphedema, not elsewhere classified  Rationale for Evaluation and Treatment: Rehabilitation  ONSET DATE: chronic but has increased significantly in the past year.   SUBJECTIVE:                                                                                                                                                                                            SUBJECTIVE STATEMENT: Pt states she heard from [facility] compression and her garments are on the way.  PERTINENT HISTORY:  LAELANI VASKO is a 78 y.o. female with past medical history significant for chronic diastolic congestive heart failure, proximal atrial fibrillation not on anticoagulation due to recurrent epistaxis s/p Watchman procedure (12/2022), CAD on aspirin , type 2 diabetes mellitus, anxiety/depression, nonalcoholic cirrhosis, OSA on CPAP, morbid obesity hospitalized for acute CHF from 10/31/23-11/04/2574 .     PAIN:  Are you having pain?   NPRS scale: 0/10  PRECAUTIONS: Other: cellulitis    RED FLAGS: None   WEIGHT BEARING RESTRICTIONS: No  FALLS:  Has patient fallen in last 6 months? No  LIVING ENVIRONMENT: Lives with: lives with their family Lives in: House/apartment Stairs: Yes: Internal: 2 steps; none and External: 2 steps; none Has following equipment at home: Quad cane small base  PATIENT GOALS: less swelling    OBJECTIVE: Note: Objective measures were completed at Evaluation unless otherwise noted.  COGNITION: Overall cognitive status: Within functional limits for tasks assessed   PALPATION: Increased induration greater in LE but noted in thigh area as well   OBSERVATIONS / OTHER ASSESSMENTS: slight redness and thickening of skin   POSTURE: forward head, rounded shoulders and increased kyphosis  All within normal limits:   LYMPHEDEMA ASSESSMENTS:    LE LANDMARK RIGHT eval Right 01/29/24 02/04/24 02/18/24 02/26/24 03/03/24 03/10/24  At groin         30 cm proximal to suprapatella  20 cm proximal to suprapatella   63.7 64 63.2 63.2   62.2  10 cm proximal to suprapatella   61.9 63.5 59.5 58.9 58  At midpatella / popliteal crease 52.5 46.3 49.5 49.8 46.7 48 44.8  30 cm proximal to floor at lateral plantar foot 53.5 45.3 46.8 47.3 45.7 45.5 44.2  20 cm proximal to floor at lateral plantar foot 44  36.6 38.7 37.5 35.8 35.1 34.7  10 cm proximal  to floor at lateral plantar foot 29 27.2 27.7 27.5 26.8 26.9 26.8  Circumference of ankle/heel 33.5 33.4 34 33.3 32.8 33 33.8  5 cm proximal to 1st MTP joint 24 24 23.8 23.2 23.3 22.8 23.4  Across MTP joint 23 22.8 23 24  22.8 22.3 23  Around proximal great toe         (Blank rows = not tested)  LE LANDMARK LEFT eval Left 01/29/24 02/04/24/ 02/18/24 02/26/24 02/21/24 03/10/24  At groin         30 cm proximal to suprapatella         20 cm proximal to suprapatella   66 63.5 63.5 63.5 62.5  10 cm proximal to suprapatella   63 63.8 61.8 60 60.5  At midpatella / popliteal crease 53 47 48.5 50.2 46.8 47 48  30 cm proximal to floor at lateral plantar foot 54.2 50.2 49.4 47 47.2 46 45.8  20 cm proximal to floor at lateral plantar foot 40.5 40.5 38.5 40.7 40 38.5 38.5  10 cm proximal to floor at lateral plantar foot 32.3 29.5 30.3 30 29.5 29 29.2  Circumference of ankle/heel 35.4 34 34.7 33.7 34.6 34.8 34  5 cm proximal to 1st MTP joint 24.3 23.6 23.2 23.5 23.1 23 23   Across MTP joint 23.3 22.5 22.7 22.9 22.5 22.2 22.7  Around proximal great toe         (Blank rows = not tested)  FUNCTIONAL TESTS:  Gait : 1:30 minutes to ambulate with quad cane x 80 ft    TODAY'S TREATMENT:                                                                                                                              DATE:  03/10/24:Measurement Manual:  PT received manual to include short neck, deep and superficial abdominal, inguinal/axillary anastomosis as well as anterior LE B Bandaging:  full LE's bilaterally  with 1/2 foam and multiple layers of short stretch bandaging  PATIENT EDUCATION:  Education details: skin care recommending using Eucerin or Aquaphor, exercise to include ankle pumps, LAQ, hip ab/adduction, marching and diaphragm breath, what manual will be like as well as multilayer compression dressing.  Person educated: Patient and Spouse Education method: Explanation, Verbal cues, and Handouts Education  comprehension: verbalized understanding and returned demonstration  HOME EXERCISE PROGRAM: ankle pumps, LAQ, hip ab/adduction, marching and diaphragm breath  ASSESSMENT:  CLINICAL IMPRESSION: Pt measurements on Rt LE are all down Lt is mixed.  Pt is most likely nearing maximal reduction.  Moisturized well prior to rebandaging.    Pt will benefit from continued skilled therapy for total decongestive techniques until pt has compression garment and pump.       OBJECTIVE IMPAIRMENTS: cardiopulmonary status limiting activity, decreased activity tolerance, decreased mobility, difficulty walking, decreased ROM, decreased strength, increased edema, and obesity.   ACTIVITY LIMITATIONS: carrying, lifting, stairs, bathing, dressing, and locomotion level  PARTICIPATION LIMITATIONS: shopping and community activity  PERSONAL FACTORS: Age, Fitness, Time since onset of injury/illness/exacerbation, and 1-2 comorbidities: CHF,  are also affecting patient's functional outcome.   REHAB POTENTIAL: Good  CLINICAL DECISION MAKING: Evolving/moderate complexity  EVALUATION COMPLEXITY: Moderate  GOALS: Goals reviewed with patient? Yes  SHORT TERM GOALS: Target date: 02/10/24  PT to be I in HEP to improve lymphatic circulation Baseline: Goal status: met  2.  PT to have lost 1-2 cm in her feet to be able to fit in her shoes, 2-3 cm in legs for improved skin integrity. Baseline:  Goal status: met   LONG TERM GOALS: Target date: 03/02/24  PT to have lost 2 -3 cm in her feet, 3-5 cm in her LE for decreased risk of cellulitis.  Baseline:  Goal status: on-going   2.  Pt to have aqcuired juxtafit compression garment and be able to don to be able to continue maintenance phase of treatment.  Baseline:  Goal status: on-going   3.  PT to be I in self manual to assist in increasing lymphatic circulation Baseline:  Goal status: on-going   PLAN:  PT FREQUENCY: 3x/week  PT DURATION: 6 weeks  PLANNED  INTERVENTIONS: 97110-Therapeutic exercises, 97535- Self Care, and 16109- Manual therapy  PLAN FOR NEXT SESSION: Continue  total decongestive techniques.   Leodis Rainwater, PT CLT [phone]  03/10/2024, 1:51 PM

## 2024-03-12 ENCOUNTER — Ambulatory Visit (HOSPITAL_COMMUNITY): Admitting: Physical Therapy

## 2024-03-12 DIAGNOSIS — I890 Lymphedema, not elsewhere classified: Secondary | ICD-10-CM

## 2024-03-12 NOTE — Therapy (Signed)
 OUTPATIENT PHYSICAL THERAPY LYMPHEDEMA TREATMENT  Patient Name: [name] MRN: [id number] DOB:Feb 27, 1946, 78 y.o., female Today's Date: 03/12/2024    END OF SESSION:  PT End of Session - 03/08/24 1640     Visit Number 16    Number of Visits 18    Date for PT Re-Evaluation 03/02/24    Authorization Type [employer]    Authorization Time Period 18 visits approved thru 6/14    Authorization - Number of Visits 16   Progress Note Due on Visit 18    PT Start Time 1404    PT Stop Time 1515    PT Time Calculation (min) 71 min    Activity Tolerance Patient tolerated treatment well    Behavior During Therapy [facility] for tasks assessed/performed                  Past Medical History:  Diagnosis Date   Carpal tunnel syndrome of right wrist 03/2013   recurrent   Chronic diastolic heart failure (HCC)    Cirrhosis, 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 ? to get liver bx if she gets bariatric surgery? Mild portal hypertensive gastropathy on EGD 08/2019.   Fibromyalgia    GAD (generalized anxiety disorder)    GERD    History of hiatal hernia    History of iron  deficiency anemia 12/2018   Inadequate absorption secondary to chronic/long term PPI therapy + portal hypertensive gastroduodenopathy. No GIB found on EGD, colonosc, and givens. Iron  infusions X multiple.   History of thrombocytopenia 12/2011   Hyperlipidemia    Intolerant of statins   HYPERTENSION    IBS (irritable bowel syndrome)    -D.  Good response to bentyl  and imodium  as of 06/2018 GI f/u.   IDDM (insulin  dependent diabetes mellitus)    with DPN (managed by Dr. Aldona Amel but then in 2018 pt preferred to have me manage for her convenience)   Limited mobility    Requires a walker for arthritic pain, widespread musculoskeletal pain, and neuropathic pain.   Morbid obesity (HCC)    As of 11/2018, pt considering sleeve gastectomy vs bipass as of eval by Dr. Molli Angelucci  considering as of 12/2019.   Nonalcoholic steatohepatitis    Viral Hep screens NEG.  CT 2015.  Transaminasemia.  U/S 07/2018 showed early changes of cirrhosis.   OSA (obstructive sleep apnea) 09/14/2015   sleep study 09/07/15: severe obstructive sleep apnea with an AHI of 72 and SaO2 low of 75%.>referred to sleep MD   Osteoarthritis    hips, shoulders, knees   PAF (paroxysmal atrial fibrillation) (HCC)    One documented episode (after getting EGD 2016).  Was on amiodarone  x 3 mo.  Rate control with metoprolol  + anticoag with xarelto . Watchman 12/2022, off anticoag   Portal hypertensive gastropathy (HCC)    hemorrhagic gastropathy + non-bleeding gastric ulcers on EGD 08/2023   Recurrent epistaxis    Granuloma in L nare cauterized by ENT 04/2020. Another cautery 06/2020   Small fiber neuropathy    Due to DM.  Symmetric hands and feet tingling/numbness.   Ulcerative colitis (HCC)    Remicade  infusion Q 8 weeks: in clinical and endoscopic remission as of 12/2018 GI f/u.  06/11/19 rpt colonoscopy->cecal and ascending colon colitis.   Past Surgical History:  Procedure Laterality Date   ABDOMINAL HYSTERECTOMY  1980   Paps no longer indicated.   BACK SURGERY     BACTERIAL OVERGROWTH TEST N/A 07/13/2015  Procedure: BACTERIAL OVERGROWTH TEST;  Surgeon: Ruby Corporal, MD;  [facility];  Service: Endoscopy;  Laterality: N/A;  730     BILATERAL SALPINGOOPHORECTOMY  02/10/2001   BIOPSY  06/11/2019   Procedure: BIOPSY;  Surgeon: Ruby Corporal, MD;  [facility];  Service: Endoscopy;;  colon   BIOPSY  09/27/2019   Procedure: BIOPSY;  Surgeon: Ruby Corporal, MD;  [facility];  Service: Endoscopy;;  gastric duodenal   BIOPSY  09/15/2020   Procedure: BIOPSY;  Surgeon: Urban Garden, MD;  [facility];  Service: Gastroenterology;;   BIOPSY  09/25/2023   Procedure: BIOPSY;  Surgeon: Urban Garden, MD;  [facility];   Service: Gastroenterology;;   BREAST REDUCTION SURGERY  1994   bilat   BREAST SURGERY     CARDIOVASCULAR STRESS TEST  07/2010   Lexiscan myoview: normal   CARPAL TUNNEL RELEASE Right 1996   CARPAL TUNNEL RELEASE Left 03/21/2003   CARPAL TUNNEL RELEASE Right 05/04/2013   Procedure: CARPAL TUNNEL RELEASE;  Surgeon: Amelie Baize., MD;  Location: [facility];  Service: Orthopedics;  Laterality: Right;   CARPAL TUNNEL RELEASE Left 09/21/2013   Procedure: LEFT CARPAL TUNNEL RELEASE;  Surgeon: Amelie Baize., MD;  Location: [facility];  Service: Orthopedics;  Laterality: Left;   CHOLECYSTECTOMY     COLONOSCOPY WITH PROPOFOL  N/A 08/04/2015   Colitis in remission.  No polyps.  Procedure: COLONOSCOPY WITH PROPOFOL ;  Surgeon: Ruby Corporal, MD;  [facility]: [facility];  Service: Endoscopy;  Laterality: N/A;  cecum time in  0820   time out  0827    total time 7 minutes   COLONOSCOPY WITH PROPOFOL  N/A 06/11/2019   cecal and ascending colon colitis.  Procedure: COLONOSCOPY WITH PROPOFOL ;  Surgeon: Ruby Corporal, MD;  [facility];  Service: Endoscopy;  Laterality: N/A;  730a   COLONOSCOPY WITH PROPOFOL  N/A 09/15/2020   Procedure: COLONOSCOPY WITH PROPOFOL ;  Surgeon: Urban Garden, MD;  [facility];  Service: Gastroenterology;  Laterality: N/A;  1030   ESOPHAGEAL DILATION N/A 08/04/2015   Procedure: ESOPHAGEAL DILATION;  Surgeon: Ruby Corporal, MD;  [facility]: [facility];  Service: Endoscopy;  Laterality: N/AFelicita Horns, no mucousal disruption   ESOPHAGOGASTRODUODENOSCOPY  09/27/2019   Performed for IDA.  Esoph dilation was done but no stricture present.  Mild portal hypertensive gastropathy, o/w normal.  Duodenal bx NEG.  h pylori neg.   ESOPHAGOGASTRODUODENOSCOPY     09/25/2023 inactive chronic gastritis, h pylor neg. benign   ESOPHAGOGASTRODUODENOSCOPY     09/25/23 hemorrhagic portal gastropathy + nonbleeding gastric ulcers (H  pylori neg)   ESOPHAGOGASTRODUODENOSCOPY (EGD) WITH ESOPHAGEAL DILATION  12/02/2005   ESOPHAGOGASTRODUODENOSCOPY (EGD) WITH PROPOFOL  N/A 08/04/2015   Procedure: ESOPHAGOGASTRODUODENOSCOPY (EGD) WITH PROPOFOL ;  Surgeon: Ruby Corporal, MD;  [facility];  Service: Endoscopy;  Laterality: N/A;  procedure 1   ESOPHAGOGASTRODUODENOSCOPY (EGD) WITH PROPOFOL  N/A 09/27/2019   Procedure: ESOPHAGOGASTRODUODENOSCOPY (EGD) WITH PROPOFOL ;  Surgeon: Ruby Corporal, MD;  [facility];  Service: Endoscopy;  Laterality: N/A;  12:10   ESOPHAGOGASTRODUODENOSCOPY (EGD) WITH PROPOFOL  N/A 10/31/2021   Procedure: ESOPHAGOGASTRODUODENOSCOPY (EGD) WITH PROPOFOL ;  Surgeon: Ruby Corporal, MD;  [facility];  Service: Endoscopy;  Laterality: N/A;  930   ESOPHAGOGASTRODUODENOSCOPY (EGD) WITH PROPOFOL  N/A 09/25/2023   Procedure: ESOPHAGOGASTRODUODENOSCOPY (EGD) WITH PROPOFOL ;  Surgeon: Urban Garden, MD;  [facility];  Service: Gastroenterology;  Laterality: N/A;  10:30AM;ASA 3   FLEXIBLE SIGMOIDOSCOPY  01/17/2012   Procedure: FLEXIBLE SIGMOIDOSCOPY;  Surgeon: Ruby Corporal, MD;  [facility];  Service: Endoscopy;  Laterality: N/A;   GIVENS CAPSULE STUDY N/A 08/03/2019   Procedure: GIVENS CAPSULE STUDY (performed for IDA)->some food debris in stomach and small amount of blood.  Surgeon: Ruby Corporal, MD;  [facility];  Service: Endoscopy;  Laterality: N/A;  730AM   HEMILAMINOTOMY LUMBAR SPINE Bilateral 09/07/1999   L4-5   HOT HEMOSTASIS  09/25/2023   Procedure: HOT HEMOSTASIS (ARGON PLASMA COAGULATION/BICAP);  Surgeon: Umberto Ganong, Bearl Limes, MD;  [facility];  Service: Gastroenterology;;   KNEE ARTHROSCOPY Right 01/1999; 10/2000   LEFT ATRIAL APPENDAGE OCCLUSION N/A 01/23/2023   Procedure: LEFT ATRIAL APPENDAGE OCCLUSION;  Surgeon: Arnoldo Lapping, MD;  Location: [facility];  Service: Cardiovascular;  Laterality: N/A;    LEFT ATRIAL APPENDAGE OCCLUSION     LYSIS OF ADHESION  02/10/2001   MALONEY DILATION  09/27/2019   Procedure: MALONEY DILATION;  Surgeon: Ruby Corporal, MD;  [facility];  Service: Endoscopy;;   NASAL ENDOSCOPY WITH EPISTAXIS CONTROL Bilateral 01/25/2022   Procedure: NASAL ENDOSCOPY WITH EPISTAXIS CONTROL;  Surgeon: Ammon Bales, MD;  Location: [facility];  Service: ENT;  Laterality: Bilateral;   NASAL SEPTOPLASTY W/ TURBINOPLASTY Bilateral 01/25/2022   Procedure: NASAL SEPTOPLASTY WITH TURBINATE REDUCTION;  Surgeon: Ammon Bales, MD;  Location: [facility] OR;  Service: ENT;  Laterality: Bilateral;   RECTOCELE REPAIR  1990; 09/12/2006   TARSAL TUNNEL RELEASE  2002   TEE WITHOUT CARDIOVERSION N/A 01/23/2023   Procedure: TRANSESOPHAGEAL ECHOCARDIOGRAM;  Surgeon: Arnoldo Lapping, MD;  Location: [facility] INVASIVE CV LAB;  Service: Cardiovascular;  Laterality: N/A;   TRANSESOPHAGEAL ECHOCARDIOGRAM (CATH LAB) N/A 08/26/2023   4mm leak around watchman device. Procedure: TRANSESOPHAGEAL ECHOCARDIOGRAM;  Surgeon: Euell Herrlich, MD;  Location: [facility] INVAS[facility] CV LAB;  Service: Cardiovascular;  Laterality: N/A;   TRANSTHORACIC ECHOCARDIOGRAM  08/04/2015   EF 60-65%, normal wall motion, mild LVH, mild LA dilation, grd I DD.  11/2023 overall poor quality, EF 55-60%   TUMOR EXCISION Left 03/21/2003   dorsal 1st web space (hand)   URETEROLYSIS Right 02/10/2001   Patient Active Problem List   Diagnosis Date Noted   Acute on chronic diastolic CHF (congestive heart failure) (HCC) 10/31/2023   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 08/26/2023   Atrial fibrillation (HCC) 01/23/2023   Gastroenteritis, infectious, presumed 11/14/2022   Infectious colitis 11/14/2022   Acute metabolic encephalopathy 10/09/2022   Back pain 10/09/2022   Diabetes mellitus (HCC) 10/09/2022   Hypoglycemia 10/09/2022   IBS (irritable bowel syndrome)    Chronic ulcerative pancolitis (HCC) 08/21/2021    Diarrhea 10/03/2020   Cirrhosis, nonalcoholic (HCC) 10/03/2020   Fecal urgency 08/02/2020   Deviated septum 07/28/2020   Epistaxis, recurrent 05/15/2020   Ulcerative colitis without complications (HCC) 08/24/2019   Gastrointestinal hemorrhage 08/24/2019   Iron  deficiency anemia due to chronic blood loss 07/28/2019   Iron  deficiency anemia 05/20/2019   Heme positive stool 05/20/2019   History of iron  deficiency anemia 12/2018   Vertigo 12/04/2015   Uncontrolled type 2 diabetes mellitus with peripheral neuropathy 11/14/2015   OSA (obstructive sleep apnea), severe 09/14/2015   B12 deficiency 08/17/2015   Vitamin B6 deficiency 08/17/2015   PAF (paroxysmal atrial fibrillation) (HCC) 08/17/2015   Chronic anticoagulation 08/17/2015   Atrial fibrillation with RVR (  HCC) 08/04/2015   Dysphagia    Gastroesophageal reflux disease without esophagitis    Lymphocytosis 07/04/2015   Obesity 05/18/2014   Allergic sinusitis 12/09/2013   Myalgia 04/28/2012   Polyarthralgia 04/28/2012   Elevated LFTs 01/16/2012   Insomnia 01/12/2012   Small fiber neuropathy 05/30/2011   Anxiety 05/30/2011   Dyslipidemia 01/24/2011   PALPITATIONS, RECURRENT 08/08/2010   Essential hypertension 04/12/2010   GERD 04/12/2010   Ulcerative colitis (HCC) 04/12/2010    PCP: Mandy Second, PA  REFERRING PROVIDER: Mandy Second, PA  REFERRING DIAG: I87.2 (ICD-10-CM) - Venous insufficiency of both lower extremities I89.0,E66.9 (ICD-10-CM) - Lymphedema associated with obesity I87.2 (ICD-10-CM) - Venous stasis dermatitis of both lower extremities  THERAPY DIAG:  Lymphedema, not elsewhere classified  Rationale for Evaluation and Treatment: Rehabilitation  ONSET DATE: chronic but has increased significantly in the past year.   SUBJECTIVE:                                                                                                                                                                                            SUBJECTIVE STATEMENT: Pt states she heard from [facility] compression and her garments are on the way.  PERTINENT HISTORY:  [name] is a 78 y.o. female with past medical history significant for chronic diastolic congestive heart failure, proximal atrial fibrillation not on anticoagulation due to recurrent epistaxis s/p Watchman procedure (12/2022), CAD on aspirin , type 2 diabetes mellitus, anxiety/depression, nonalcoholic cirrhosis, OSA on CPAP, morbid obesity hospitalized for acute CHF from 10/31/23-11/04/2574 .     PAIN:  Are you having pain?   NPRS scale: 0/10  PRECAUTIONS: Other: cellulitis    RED FLAGS: None   WEIGHT BEARING RESTRICTIONS: No  FALLS:  Has patient fallen in last 6 months? No  LIVING ENVIRONMENT: Lives with: lives with their family Lives in: House/apartment Stairs: Yes: Internal: 2 steps; none and External: 2 steps; none Has following equipment at home: Quad cane small base  PATIENT GOALS: less swelling    OBJECTIVE: Note: Objective measures were completed at Evaluation unless otherwise noted.  COGNITION: Overall cognitive status: Within functional limits for tasks assessed   PALPATION: Increased induration greater in LE but noted in thigh area as well   OBSERVATIONS / OTHER ASSESSMENTS: slight redness and thickening of skin   POSTURE: forward head, rounded shoulders and increased kyphosis  All within normal limits:   LYMPHEDEMA ASSESSMENTS:    LE LANDMARK RIGHT eval Right 01/29/24 02/04/24 02/18/24 02/26/24 03/03/24 03/10/24  At groin         30 cm proximal to suprapatella  20 cm proximal to suprapatella   63.7 64 63.2 63.2   62.2  10 cm proximal to suprapatella   61.9 63.5 59.5 58.9 58  At midpatella / popliteal crease 52.5 46.3 49.5 49.8 46.7 48 44.8  30 cm proximal to floor at lateral plantar foot 53.5 45.3 46.8 47.3 45.7 45.5 44.2  20 cm proximal to floor at lateral plantar foot 44  36.6 38.7 37.5 35.8 35.1 34.7  10 cm proximal  to floor at lateral plantar foot 29 27.2 27.7 27.5 26.8 26.9 26.8  Circumference of ankle/heel 33.5 33.4 34 33.3 32.8 33 33.8  5 cm proximal to 1st MTP joint 24 24 23.8 23.2 23.3 22.8 23.4  Across MTP joint 23 22.8 23 24  22.8 22.3 23  Around proximal great toe         (Blank rows = not tested)  LE LANDMARK LEFT eval Left 01/29/24 02/04/24/ 02/18/24 02/26/24 02/21/24 03/10/24  At groin         30 cm proximal to suprapatella         20 cm proximal to suprapatella   66 63.5 63.5 63.5 62.5  10 cm proximal to suprapatella   63 63.8 61.8 60 60.5  At midpatella / popliteal crease 53 47 48.5 50.2 46.8 47 48  30 cm proximal to floor at lateral plantar foot 54.2 50.2 49.4 47 47.2 46 45.8  20 cm proximal to floor at lateral plantar foot 40.5 40.5 38.5 40.7 40 38.5 38.5  10 cm proximal to floor at lateral plantar foot 32.3 29.5 30.3 30 29.5 29 29.2  Circumference of ankle/heel 35.4 34 34.7 33.7 34.6 34.8 34  5 cm proximal to 1st MTP joint 24.3 23.6 23.2 23.5 23.1 23 23   Across MTP joint 23.3 22.5 22.7 22.9 22.5 22.2 22.7  Around proximal great toe         (Blank rows = not tested)  FUNCTIONAL TESTS:  Gait : 1:30 minutes to ambulate with quad cane x 80 ft  Evaluation  03/12/24 able to ambulate 80 ft in 1:06   TODAY'S TREATMENT:                                                                                                                              DATE:  03/10/24:Measurement Manual:  PT received manual to include short neck, deep and superficial abdominal, inguinal/axillary anastomosis as well as anterior LE B Bandaging:  full LE's bilaterally  with 1/2 foam and multiple layers of short stretch bandaging  PATIENT EDUCATION:  Education details: skin care recommending using Eucerin or Aquaphor, exercise to include ankle pumps, LAQ, hip ab/adduction, marching and diaphragm breath, what manual will be like as well as multilayer compression dressing.  Person educated: Patient and Spouse Education  method: Explanation, Verbal cues, and Handouts Education comprehension: verbalized understanding and returned demonstration  HOME EXERCISE PROGRAM: ankle pumps, LAQ, hip ab/adduction, marching and diaphragm breath  ASSESSMENT:  CLINICAL IMPRESSION: Pt  measurements on Rt LE are all down Lt is mixed.  PT will continue to benefit from skilled PT for total decongestive techniques for two more weeks to achieve maximal reduction of volume. PT's compression garments should be mailed early next week.  Husband is going to call and ask about the pump arrival when he gets home.       OBJECTIVE IMPAIRMENTS: cardiopulmonary status limiting activity, decreased activity tolerance, decreased mobility, difficulty walking, decreased ROM, decreased strength, increased edema, and obesity.   ACTIVITY LIMITATIONS: carrying, lifting, stairs, bathing, dressing, and locomotion level  PARTICIPATION LIMITATIONS: shopping and community activity  PERSONAL FACTORS: Age, Fitness, Time since onset of injury/illness/exacerbation, and 1-2 comorbidities: CHF,  are also affecting patient's functional outcome.   REHAB POTENTIAL: Good  CLINICAL DECISION MAKING: Evolving/moderate complexity  EVALUATION COMPLEXITY: Moderate  GOALS: Goals reviewed with patient? Yes  SHORT TERM GOALS: Target date: 02/10/24  PT to be I in HEP to improve lymphatic circulation Baseline: Goal status: met  2.  PT to have lost 1-2 cm in her feet to be able to fit in her shoes, 2-3 cm in legs for improved skin integrity. Baseline:  Goal status: met   LONG TERM GOALS: Target date: 03/02/24  PT to have lost 2 -3 cm in her feet, 3-5 cm in her LE for decreased risk of cellulitis.  Baseline:  Goal status: partially met    2.  Pt to have aqcuired juxtafit compression garment and be able to don to be able to continue maintenance phase of treatment.  Baseline:  Goal status: should be in late next week 03/12/24   3.  PT to be I in self manual to  assist in increasing lymphatic circulation Baseline:  Goal status: met  PLAN:  PT FREQUENCY: 3x/week  PT DURATION: 6 weeks an additional 2 weeks.   PLANNED INTERVENTIONS: 97110-Therapeutic exercises, 97535- Self Care, and 16109- Manual therapy  PLAN FOR NEXT SESSION: Continue  total decongestive techniques for two more weeks   Leodis Rainwater, PT CLT [phone]  03/12/2024, 5:13 PM [employer] Auth Request  Referring diagnosis code (ICD 10)? I87.2 (ICD-10-CM) - Venous insufficiency of both lower extremities I89.0,E66.9 (ICD-10-CM) - Lymphedema associated with obesity I87.2 (ICD-10-CM) - Venous stasis dermatitis of both lower extremities Treatment diagnosis codes (ICD 10)? (if different than referring diagnosis) I87.2 (ICD-10-CM) - Venous insufficiency of both lower extremities I89.0,E66.9 (ICD-10-CM) - Lymphedema associated with obesity I87.2 (ICD-10-CM) - Venous stasis dermatitis of both lower extremities What was this (referring dx) caused by? []  Surgery []  Fall []  Ongoing issue []  Arthritis []  Other: _______none_____  Laterality: []  Rt []  Lt [x]  Both  Deficits: []  Pain [x]  Stiffness []  Weakness [x]  Edema []  Balance Deficits []  Coordination []  Gait Disturbance []  ROM []  Other   Functional Tool Score: Gait : 1:30 minutes to ambulate with quad cane x 80 ft  Evaluation  03/12/24 able to ambulate 80 ft in 1:06 Pt was unable to lift her own legs onto the bed at eval, able to lift her own legs onto the bed today.   CPT codes: See Planned Interventions listed in the Plan section of the Evaluation.

## 2024-03-22 ENCOUNTER — Encounter (HOSPITAL_COMMUNITY)

## 2024-03-22 ENCOUNTER — Telehealth (HOSPITAL_COMMUNITY): Payer: Self-pay

## 2024-03-22 NOTE — Telephone Encounter (Signed)
 No show, called with no answer concerning missed apt today. Mailbox was full, unable to leave a message.   Augustin Mclean, LPTA/CLT; WILLAIM [phone]

## 2024-03-23 ENCOUNTER — Telehealth: Payer: Self-pay

## 2024-03-23 NOTE — Telephone Encounter (Signed)
 Yes, sounds like this should be a cardiology concern. Thanks

## 2024-03-23 NOTE — Telephone Encounter (Signed)
 Reason for CRM: Bard from [facility] states he was checking on an order for certificate of medical necessity and an order for heart failure clearance for a compression pump but found out he was sending the fax to the phone number and not the fax machine. Confirmed the fax number and he is re-faxing now.  Please be on the lookout for this incoming fax.  Received fax. Placed in [facility] inbox unless we need to fax to [facility].  Dr. Candise is out of office, will return on Friday 03/26/24.  PCP is Benton Gave, but patient will be transferring to Dr. Candise on 05/03/24.

## 2024-03-23 NOTE — Telephone Encounter (Signed)
 I do not have any orders here. Pt has been following with Dr. McGowen for years, just transitioned to me for two visits. He should be familiar with her.

## 2024-03-23 NOTE — Telephone Encounter (Signed)
 Please advise   Orders should be coming from cardiology correct?

## 2024-03-24 ENCOUNTER — Encounter (INDEPENDENT_AMBULATORY_CARE_PROVIDER_SITE_OTHER): Payer: Self-pay | Admitting: Gastroenterology

## 2024-03-24 ENCOUNTER — Telehealth (INDEPENDENT_AMBULATORY_CARE_PROVIDER_SITE_OTHER): Payer: Self-pay

## 2024-03-24 ENCOUNTER — Telehealth: Payer: Self-pay | Admitting: *Deleted

## 2024-03-24 ENCOUNTER — Ambulatory Visit (INDEPENDENT_AMBULATORY_CARE_PROVIDER_SITE_OTHER): Admitting: Gastroenterology

## 2024-03-24 VITALS — BP 105/52 | HR 75 | Temp 98.4°F | Ht 62.0 in | Wt 209.6 lb

## 2024-03-24 DIAGNOSIS — Z1159 Encounter for screening for other viral diseases: Secondary | ICD-10-CM | POA: Diagnosis not present

## 2024-03-24 DIAGNOSIS — Z111 Encounter for screening for respiratory tuberculosis: Secondary | ICD-10-CM | POA: Diagnosis not present

## 2024-03-24 DIAGNOSIS — K51011 Ulcerative (chronic) pancolitis with rectal bleeding: Secondary | ICD-10-CM

## 2024-03-24 DIAGNOSIS — K7581 Nonalcoholic steatohepatitis (NASH): Secondary | ICD-10-CM

## 2024-03-24 DIAGNOSIS — K5190 Ulcerative colitis, unspecified, without complications: Secondary | ICD-10-CM

## 2024-03-24 DIAGNOSIS — K7460 Unspecified cirrhosis of liver: Secondary | ICD-10-CM

## 2024-03-24 NOTE — Telephone Encounter (Signed)
 Per Dr. Eartha, Start Tremfya at [facility]. He sent the orders to [facility].

## 2024-03-24 NOTE — Patient Instructions (Addendum)
 Will start induction and maintenance with Tremfya Perform blood workup Perform stool workup  - if normal will give prednisone  taper -Schedule liver US  -Reduce salt intake to <2 g per day -Can take Tylenol  max of 2 g per day (650 mg q8h) for pain -Avoid NSAIDs for pain -Avoid eating raw oysters/shellfish -Protein shake (Ensure) every night before going to sleep -Implement a Mediterranean diet

## 2024-03-24 NOTE — Telephone Encounter (Signed)
 Called pt, VM full, need to give US  appt for 7/2, arrival 10:15am, npo midnight

## 2024-03-24 NOTE — Progress Notes (Signed)
 [name], M.D. Gastroenterology & Hepatology [facility] Gastroenterology [address] [place], [place] [address]  Primary Care Physician: Candise Aleene DEL, MD [address] [place] [address]  I will communicate my assessment and recommendations to the referring MD via EMR.  Problems: Ulcerative colitis, previously on infliximab  NASH cirrhosis GAVE Iron -deficiency anemia   History of Present Illness: [name] is a 78 y.o. female with past medical history of ulcerative colitis, NASH cirrhosis, GAVE, atrial fibrillation status post watchman placement, GERD, anxiety, diastolic heart failure, coronary artery disease, hyperlipidemia, history of iron  deficiency anemia, diabetes, neuropathy, OSA, IBS, fibromyalgia, who presents for follow up of ulcerative colitis and cirrhosis.  The patient was last seen on 05/08/2023. At that time, the patient was advised to continue Remicade  at the same dose and to check Remicade  levels after she had received 4 doses of this high dose medication since she was presenting ongoing symptoms despite taking the medication compliantly.  Patient was previously on IFX 10 mg/kg every 8 weeks due to suboptimal levels.  Patient had her last dose of Remicade  in November 2024. Patient reports that it was difficult to obtain IV access in the rheumatology office where she has infusions, so she never was able to get these medications.   Notably, the patient was admitted to the hospital on 10/31/2023 presenting acute on chronic heart failure. She was discharged on diuretics and to rehab.  Patient reports that for the last week she has presented recurrent episodes of rectal bleeding. She has also presented recurrent issues with lower abdomen as well for the last week, never had this pain before. She states she has had 1 large BM per day, but may have 4-5 small Bms during the day. This has been her usual amount of bowel movements.    The patient denies having any nausea, vomiting, fever, chills, melena, hematemesis, jaundice, pruritus. Has lost some weight by changing diet, she has been eating smaller portions.  Cirrhosis related questions: Hematemesis/coffee ground emesis: No History of variceal bleeding: No Abdominal pain: No Abdominal distention/worsening ascites:  No Fever/chills: No Episodes of confusion/disorientation: No Taking diuretics?: No Prior history of banding?: No Prior episodes of SBP: No Last time liver imaging was performed: 05/23/23 - US  RUQ  - no liver masses MELD score: 05/08/23 - 10 Last AFP 8/24 - 3.9 Hepatitis A: immune Hepatitis B: has not received vaccination Most recent hepatitis B surface antigen and QuantiFERON testing: Negative on December 2023   Last EGD: 09/25/23 - 1 cm hiatal hernia. - Hemorrhagic gastropathy. Treated with argon plasma coagulation ( APC) . - Non- bleeding gastric ulcers with a clean ulcer base ( Forrest Class III) . Biopsied. - Normal examined duodenum.   Recommended a repeat EGD in 3 months for surveillance GAVE   Last Colonoscopy: 09/15/2020 - Perianal skin tags found on perianal exam. - The examined portion of the ileum was normal. - Inactive (Mayo Score 0) ulcerative colitis, improved since the last examination. Biopsied. - Diverticulosis in the sigmoid colon.   FINAL MICROSCOPIC DIAGNOSIS:  A. COLON, CECUM. BIOPSY: - Benign colonic mucosa. - No active inflammation. - No dysplasia or malignancy.  B. COLON, 80 CM, BIOPSY: - Benign colonic mucosa. - No active inflammation. - No dysplasia or malignancy.  C. COLON, 70 CM, BIOPSY: - Benign colonic mucosa. - No active inflammation. - No dysplasia or malignancy.  D. COLON, 60 CM, BIOPSY: - Benign colonic mucosa. - No active inflammation. - No  dysplasia or malignancy.  E. COLON, 50 CM, BIOPSY: - Benign colonic mucosa. - No active inflammation. - No dysplasia or malignancy.  F. COLON, 40 CM,  BIOPSY: - Benign colonic mucosa. - No active inflammation. - No dysplasia or malignancy.  G. COLON, 30 CM, BIOPSY: - Benign colonic mucosa. - No active inflammation. - No dysplasia or malignancy.  H. COLON, 20 CM, BIOPSY: - Chronic moderately active colitis. - No dysplasia or malignancy.  I. COLON, 10 CM, BIOPSY: - Benign colonic mucosa. - No active inflammation. - No dysplasia or malignancy.   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 ? to get liver bx if she gets bariatric surgery? Mild portal hypertensive gastropathy on EGD 08/2019.   Fibromyalgia    GAD (generalized anxiety disorder)    GERD    History of hiatal hernia    History of iron  deficiency anemia 12/2018   Inadequate absorption secondary to chronic/long term PPI therapy + portal hypertensive gastroduodenopathy. No GIB found on EGD, colonosc, and givens. Iron  infusions X multiple.   History of thrombocytopenia 12/2011   Hyperlipidemia    Intolerant of statins   HYPERTENSION    IBS (irritable bowel syndrome)    -D.  Good response to bentyl  and imodium  as of 06/2018 GI f/u.   IDDM (insulin  dependent diabetes mellitus)    with DPN (managed by Dr. Trixie but then in 2018 pt preferred to have me manage for her convenience)   Limited mobility    Requires a walker for arthritic pain, widespread musculoskeletal pain, and neuropathic pain.   Morbid obesity (HCC)    As of 11/2018, pt considering sleeve gastectomy vs bipass as of eval by Dr. Gael considering as of 12/2019.   Nonalcoholic steatohepatitis    Viral Hep screens NEG.  CT 2015.  Transaminasemia.  U/S 07/2018 showed early changes of cirrhosis.   OSA (obstructive sleep apnea) 09/14/2015   sleep study 09/07/15: severe obstructive sleep apnea  with an AHI of 72 and SaO2 low of 75%.>referred to sleep MD   Osteoarthritis    hips, shoulders, knees   PAF (paroxysmal atrial fibrillation) (HCC)    One documented episode (after getting EGD 2016).  Was on amiodarone  x 3 mo.  Rate control with metoprolol  + anticoag with xarelto . Watchman 12/2022, off anticoag   Portal hypertensive gastropathy (HCC)    hemorrhagic gastropathy + non-bleeding gastric ulcers on EGD 08/2023   Recurrent epistaxis    Granuloma in L nare cauterized by ENT 04/2020. Another cautery 06/2020   Small fiber neuropathy    Due to DM.  Symmetric hands and feet tingling/numbness.   Ulcerative colitis (HCC)    Remicade  infusion Q 8 weeks: in clinical and endoscopic remission as of 12/2018 GI f/u.  06/11/19 rpt colonoscopy->cecal and ascending colon colitis.    Past Surgical History: Past Surgical History:  Procedure Laterality Date   ABDOMINAL HYSTERECTOMY  1980   Paps no longer indicated.   BACK SURGERY     BACTERIAL OVERGROWTH TEST N/A 07/13/2015   Procedure: BACTERIAL OVERGROWTH TEST;  Surgeon: Claudis RAYMOND Rivet, MD;  [facility];  Service: Endoscopy;  Laterality: N/A;  730     BILATERAL SALPINGOOPHORECTOMY  02/10/2001   BIOPSY  06/11/2019   Procedure: BIOPSY;  Surgeon: Rivet Claudis RAYMOND,  MD;  [facility]: [facility];  Service: Endoscopy;;  colon   BIOPSY  09/27/2019   Procedure: BIOPSY;  Surgeon: Golda Claudis PENNER, MD;  [facility]: [facility];  Service: Endoscopy;;  gastric duodenal   BIOPSY  09/15/2020   Procedure: BIOPSY;  Surgeon: Eartha Angelia Sieving, MD;  [facility]: [facility];  Service: Gastroenterology;;   BIOPSY  09/25/2023   Procedure: BIOPSY;  Surgeon: Eartha Angelia Sieving, MD;  [facility];  Service: Gastroenterology;;   BREAST REDUCTION SURGERY  1994   bilat   BREAST SURGERY     CARDIOVASCULAR STRESS TEST  07/2010   Lexiscan myoview: normal   CARPAL TUNNEL RELEASE Right 1996   CARPAL TUNNEL RELEASE Left 03/21/2003    CARPAL TUNNEL RELEASE Right 05/04/2013   Procedure: CARPAL TUNNEL RELEASE;  Surgeon: Lamar LULLA Leonor Mickey., MD;  Location: [facility];  Service: Orthopedics;  Laterality: Right;   CARPAL TUNNEL RELEASE Left 09/21/2013   Procedure: LEFT CARPAL TUNNEL RELEASE;  Surgeon: Lamar LULLA Leonor Mickey., MD;  Location: [facility];  Service: Orthopedics;  Laterality: Left;   CHOLECYSTECTOMY     COLONOSCOPY WITH PROPOFOL  N/A 08/04/2015   Colitis in remission.  No polyps.  Procedure: COLONOSCOPY WITH PROPOFOL ;  Surgeon: Claudis PENNER Golda, MD;  Location: [facility];  Service: Endoscopy;  Laterality: N/A;  cecum time in  0820   time out  0827    total time 7 minutes   COLONOSCOPY WITH PROPOFOL  N/A 06/11/2019   cecal and ascending colon colitis.  Procedure: COLONOSCOPY WITH PROPOFOL ;  Surgeon: Golda Claudis PENNER, MD;  [facility];  Service: Endoscopy;  Laterality: N/A;  730a   COLONOSCOPY WITH PROPOFOL  N/A 09/15/2020   Procedure: COLONOSCOPY WITH PROPOFOL ;  Surgeon: Eartha Angelia Sieving, MD;  [facility];  Service: Gastroenterology;  Laterality: N/A;  1030   ESOPHAGEAL DILATION N/A 08/04/2015   Procedure: ESOPHAGEAL DILATION;  Surgeon: Claudis PENNER Golda, MD;  Location: [facility];  Service: Endoscopy;  Laterality: N/AMERL Agapito MOH, no mucousal disruption   ESOPHAGOGASTRODUODENOSCOPY  09/27/2019   Performed for IDA.  Esoph dilation was done but no stricture present.  Mild portal hypertensive gastropathy, o/w normal.  Duodenal bx NEG.  h pylori neg.   ESOPHAGOGASTRODUODENOSCOPY     09/25/2023 inactive chronic gastritis, h pylor neg. benign   ESOPHAGOGASTRODUODENOSCOPY     09/25/23 hemorrhagic portal gastropathy + nonbleeding gastric ulcers (H pylori neg)   ESOPHAGOGASTRODUODENOSCOPY (EGD) WITH ESOPHAGEAL DILATION  12/02/2005   ESOPHAGOGASTRODUODENOSCOPY (EGD) WITH PROPOFOL  N/A 08/04/2015   Procedure: ESOPHAGOGASTRODUODENOSCOPY (EGD) WITH PROPOFOL ;  Surgeon: Claudis PENNER Golda,  MD;  Location: [facility];  Service: Endoscopy;  Laterality: N/A;  procedure 1   ESOPHAGOGASTRODUODENOSCOPY (EGD) WITH PROPOFOL  N/A 09/27/2019   Procedure: ESOPHAGOGASTRODUODENOSCOPY (EGD) WITH PROPOFOL ;  Surgeon: Golda Claudis PENNER, MD;  [facility];  Service: Endoscopy;  Laterality: N/A;  12:10   ESOPHAGOGASTRODUODENOSCOPY (EGD) WITH PROPOFOL  N/A 10/31/2021   Procedure: ESOPHAGOGASTRODUODENOSCOPY (EGD) WITH PROPOFOL ;  Surgeon: Golda Claudis PENNER, MD;  [facility];  Service: Endoscopy;  Laterality: N/A;  930   ESOPHAGOGASTRODUODENOSCOPY (EGD) WITH PROPOFOL  N/A 09/25/2023   Procedure: ESOPHAGOGASTRODUODENOSCOPY (EGD) WITH PROPOFOL ;  Surgeon: Eartha Angelia Sieving, MD;  [facility];  Service: Gastroenterology;  Laterality: N/A;  10:30AM;ASA 3   FLEXIBLE SIGMOIDOSCOPY  01/17/2012   Procedure: FLEXIBLE SIGMOIDOSCOPY;  Surgeon: Claudis PENNER Golda, MD;  [facility];  Service: Endoscopy;  Laterality: N/A;   GIVENS CAPSULE  STUDY N/A 08/03/2019   Procedure: GIVENS CAPSULE STUDY (performed for IDA)->some food debris in stomach and small amount of blood.  Surgeon: Golda Claudis PENNER, MD;  [facility];  Service: Endoscopy;  Laterality: N/A;  730AM   HEMILAMINOTOMY LUMBAR SPINE Bilateral 09/07/1999   L4-5   HOT HEMOSTASIS  09/25/2023   Procedure: HOT HEMOSTASIS (ARGON PLASMA COAGULATION/BICAP);  Surgeon: Eartha Flavors, Toribio, MD;  [facility];  Service: Gastroenterology;;   KNEE ARTHROSCOPY Right 01/1999; 10/2000   LEFT ATRIAL APPENDAGE OCCLUSION N/A 01/23/2023   Procedure: LEFT ATRIAL APPENDAGE OCCLUSION;  Surgeon: Wonda Sharper, MD;  Location: [facility] INVASIVE CV LAB;  Service: Cardiovascular;  Laterality: N/A;   LEFT ATRIAL APPENDAGE OCCLUSION     LYSIS OF ADHESION  02/10/2001   MALONEY DILATION  09/27/2019   Procedure: MALONEY DILATION;  Surgeon: Golda Claudis PENNER, MD;  [facility];  Service: Endoscopy;;   NASAL ENDOSCOPY WITH  EPISTAXIS CONTROL Bilateral 01/25/2022   Procedure: NASAL ENDOSCOPY WITH EPISTAXIS CONTROL;  Surgeon: Mable Lenis, MD;  Location: [facility] OR;  Service: ENT;  Laterality: Bilateral;   NASAL SEPTOPLASTY W/ TURBINOPLASTY Bilateral 01/25/2022   Procedure: NASAL SEPTOPLASTY WITH TURBINATE REDUCTION;  Surgeon: Mable Lenis, MD;  Location: [facility] OR;  Service: ENT;  Laterality: Bilateral;   RECTOCELE REPAIR  1990; 09/12/2006   TARSAL TUNNEL RELEASE  2002   TEE WITHOUT CARDIOVERSION N/A 01/23/2023   Procedure: TRANSESOPHAGEAL ECHOCARDIOGRAM;  Surgeon: Wonda Sharper, MD;  Location: [facility] INVASIVE CV LAB;  Service: Cardiovascular;  Laterality: N/A;   TRANSESOPHAGEAL ECHOCARDIOGRAM (CATH LAB) N/A 08/26/2023   4mm leak around watchman device. Procedure: TRANSESOPHAGEAL ECHOCARDIOGRAM;  Surgeon: Loni Soyla LABOR, MD;  Location: [facility] INVASIVE CV LAB;  Service: Cardiovascular;  Laterality: N/A;   TRANSTHORACIC ECHOCARDIOGRAM  08/04/2015   EF 60-65%, normal wall motion, mild LVH, mild LA dilation, grd I DD.  11/2023 overall poor quality, EF 55-60%   TUMOR EXCISION Left 03/21/2003   dorsal 1st web space (hand)   URETEROLYSIS Right 02/10/2001    Family History: Family History  Problem Relation Age of Onset   Diabetes Mother    Hypertension Mother    Heart attack Father        Mid 72's   Heart disease Father    Lung disease Father        spot on lung; had lung surgery   Alcohol abuse Other    Hypertension Son    Diabetes Son    Emphysema Maternal Grandfather    Asthma Maternal Grandfather    Colon cancer Paternal Grandfather    Stomach cancer Paternal Grandmother    Neuropathy Neg Hx     Social History: Social History   Tobacco Use  Smoking Status Never  Smokeless Tobacco Never   Social History   Substance and Sexual Activity  Alcohol Use Not Currently   Comment: ocassionally   Social History   Substance and Sexual Activity  Drug Use No    Allergies: Allergies  Allergen Reactions    Flagyl  [Metronidazole  Hcl] Other (See Comments)    DIAPHORESIS   Omeprazole  Anaphylaxis and Swelling    SWELLING OF TONGUE AND THROAT   Pioglitazone Other (See Comments) and Swelling    Weight gain, tongue swelling   Benzocaine-Menthol Swelling    SWELLING OF MOUTH   Metformin  And Related Diarrhea   Shrimp [Shellfish Allergy] Itching    OF THROAT AND EARS, if consumed raw   Statins Palpitations   Welchol [Colesevelam] Other (See Comments)  GI UPSET   Allevyn Adhesive [Wound Dressings]     Other reaction(s): Unknown   Jardiance  [Empagliflozin ] Other (See Comments)    weakness   Lactose Diarrhea and Other (See Comments)    Gas, bloating   Lactose Intolerance (Gi) Diarrhea    Gas, bloating   Adhesive [Tape] Other (See Comments)    SKIN IRRITATION AND BRUISING   Desipramine  Hcl Itching, Nausea Only and Other (See Comments)    swimmy headed, ears itched   Hydromorphone  Itching   Nisoldipine Itching   Percocet [Oxycodone -Acetaminophen ] Itching    Medications: Current Outpatient Medications  Medication Sig Dispense Refill   Accu-Chek Softclix Lancets lancets USE TO CHECK BLOOD GLUCOSE UP TO 6 TIMES DAILY AS DIRECTED 200 each 5   acetaminophen  (TYLENOL ) 500 MG tablet Take 1,000 mg by mouth every 6 (six) hours as needed for mild pain (pain score 1-3).     albuterol  (VENTOLIN  HFA) 108 (90 Base) MCG/ACT inhaler Inhale 2 puffs into the lungs every 6 (six) hours as needed for wheezing or shortness of breath. 8 g 0   aspirin  EC 81 MG tablet Take 1 tablet (81 mg total) by mouth daily. Swallow whole.     blood glucose meter kit and supplies KIT Use up to six times daily as directed. DX. E11.9 1 each 0   citalopram  (CELEXA ) 20 MG tablet TAKE 1 TABLET BY MOUTH EVERY DAY 90 tablet 1   Continuous Glucose Receiver (FREESTYLE LIBRE 3 READER) DEVI Use as directed to check blood sugars 1 each 11   Continuous Glucose Sensor (FREESTYLE LIBRE 3 SENSOR) MISC 1 each by Does not apply route every  14 (fourteen) days. Please apply for 14 days and then switch to new sensor 2 each 3   dapagliflozin  propanediol (FARXIGA ) 5 MG TABS tablet Take 1 tablet (5 mg total) by mouth daily. 30 tablet 1   diphenoxylate -atropine  (LOMOTIL ) 2.5-0.025 MG tablet Take 1 tablet by mouth 4 (four) times daily as needed for diarrhea or loose stools. 60 tablet 2   Dulaglutide  (TRULICITY ) 1.5 MG/0.5ML SOAJ INJECT 1.5 MG (0.5ML) UNDER THE SKIN ONCE A WEEK 18 mL 0   esomeprazole  (NEXIUM ) 40 MG capsule Take 1 capsule (40 mg total) by mouth daily before breakfast. 90 capsule 3   furosemide  (LASIX ) 20 MG tablet Take 3 tablets (60 mg total) by mouth 2 (two) times daily. 540 tablet 0   gabapentin  (NEURONTIN ) 600 MG tablet TAKE 1 AND 1/2 TABLETS BY MOUTH TWICE A DAY (Patient taking differently: Take 500 mg by mouth daily at 6 (six) AM.) 270 tablet 1   glucose blood (ACCU-CHEK GUIDE TEST) test strip USE TO CHECK BLOOD GLUCOSE UP TO 6 TIMES DAILY. 500 strip 1   HYDROcodone -acetaminophen  (NORCO/VICODIN) 5-325 MG tablet 1 tab po bid prn pain 60 tablet 0   insulin  degludec (TRESIBA  FLEXTOUCH) 100 UNIT/ML FlexTouch Pen Inject 15 Units into the skin at bedtime. 3 mL 5   insulin  lispro (HUMALOG  KWIKPEN) 100 UNIT/ML KwikPen Inject per sliding scale three times daily, as needed, not to exceed 60 units daily 3 mL 5   Insulin  Pen Needle (B-D UF III MINI PEN NEEDLES) 31G X 5 MM MISC USE TO INJECT INSULINS EQUAL TO 6 TIMES DAILY. 500 each 6   lactase (LACTAID) 3000 UNITS tablet Take 3,000 Units by mouth as needed (when eating foods containing dairy).      meclizine  (ANTIVERT ) 25 MG tablet Take 1 tablet (25 mg total) by mouth 3 (three) times daily as needed  for dizziness or nausea. 90 tablet 2   metoprolol  succinate (TOPROL -XL) 100 MG 24 hr tablet TAKE 1 TABLET BY MOUTH 2 TIMES DAILY. TAKE WITH OR IMMEDIATELY FOLLOWING A MEAL 180 tablet 1   oxybutynin  (DITROPAN -XL) 5 MG 24 hr tablet TAKE 1 TABLET BY MOUTH EVERYDAY AT BEDTIME (Patient taking  differently: Takes occasionally.) 30 tablet 0   potassium chloride  (KLOR-CON  M) 10 MEQ tablet TAKE 1 TABLET BY MOUTH EVERY DAY 90 tablet 1   spironolactone  (ALDACTONE ) 25 MG tablet Take 1 tablet (25 mg total) by mouth daily. 30 tablet 0   INFLIXIMAB  IV Inject into the vein every 8 (eight) weeks. Remicaid 5mg /kg Infusion every 8 weeks [facility]) (Patient not taking: Reported on 03/24/2024)     No current facility-administered medications for this visit.    Review of Systems: GENERAL: negative for malaise, night sweats HEENT: No changes in hearing or vision, no nose bleeds or other nasal problems. NECK: Negative for lumps, goiter, pain and significant neck swelling RESPIRATORY: Negative for cough, wheezing CARDIOVASCULAR: Negative for chest pain, leg swelling, palpitations, orthopnea GI: SEE HPI MUSCULOSKELETAL: Negative for joint pain or swelling, back pain, and muscle pain. SKIN: Negative for lesions, rash PSYCH: Negative for sleep disturbance, mood disorder and recent psychosocial stressors. HEMATOLOGY Negative for prolonged bleeding, bruising easily, and swollen nodes. ENDOCRINE: Negative for cold or heat intolerance, polyuria, polydipsia and goiter. NEURO: negative for tremor, gait imbalance, syncope and seizures. The remainder of the review of systems is noncontributory.   Physical Exam: BP (!) 105/52 (BP Location: Left Arm, Patient Position: Sitting, Cuff Size: Large)   Pulse 75   Temp 98.4 F (36.9 C) (Temporal)   Ht 5' 2 (1.575 m)   Wt 209 lb 9.6 oz (95.1 kg)   BMI 38.34 kg/m  GENERAL: The patient is AO x3, in no acute distress. Obese HEENT: Head is normocephalic and atraumatic. EOMI are intact. Mouth is well hydrated and without lesions. NECK: Supple. No masses LUNGS: Clear to auscultation. No presence of rhonchi/wheezing/rales. Adequate chest expansion HEART: RRR, normal s1 and s2. ABDOMEN: Soft, nontender, no guarding, no peritoneal signs, and  nondistended. BS +. No masses. EXTREMITIES: Without any cyanosis, clubbing, rash, lesions or edema. NEUROLOGIC: AOx3, no focal motor deficit. SKIN: no jaundice, no rashes  Imaging/Labs: as above  I personally reviewed and interpreted the available labs, imaging and endoscopic files.  Impression and Plan: [name] is a 78 y.o. female with past medical history of ulcerative colitis, NASH cirrhosis, GAVE, atrial fibrillation status post watchman placement, GERD, anxiety, diastolic heart failure, coronary artery disease, hyperlipidemia, history of iron  deficiency anemia, diabetes, neuropathy, OSA, IBS, fibromyalgia, who presents for follow up of ulcerative colitis and cirrhosis.  Patient has a complex medical history with 2 main gastrointestinal diseases we have been managing.    In terms of her ulcerative colitis, she was presented significant symptom improvement while on Remicade  but she had to discontinue this medication and has been off the medicine for 6 months.  Has recently had exacerbation of her symptomatology, which I suspect is secondary to untreated ulcerative colitis.  Will need to determine if she has an active gastrointestinal infection versus flare with stool based testing first.  If negative for infection, we will start her on a prednisone  taper.  We had a thorough discussion regarding the fact that anti-TNF's are contraindicated in patients with heart failure with history of decompensation as this may decompensate her disease further.  Upon further discussion, we agreed to  try Tremfya as an option to treat her moderate to severe ulcerative colitis.  Will obtain surveillance labs and hepatitis B/TB screening.  Regarding her liver cirrhosis due to NASH, she has not presented any decompensating events.  She is long-time overdue for [facility] screening, which will be ordered today with ultrasound and AFP.  Will obtain repeat MELD labs as well.  She was advised about the importance of weight  loss and implementing a Mediterranean diet  - Will start induction and maintenance with Tremfya for ulcerative colitis - Check CBC, MELD labs, AFP, iron  panel, TB/hepatitis B screening - Check GI pathogen panel and C. difficile testing - if normal will prescribe prednisone  taper -Schedule liver US  -Reduce salt intake to <2 g per day -Can take Tylenol  max of 2 g per day (650 mg q8h) for pain -Avoid NSAIDs for pain -Avoid eating raw oysters/shellfish -Protein shake (Ensure) every night before going to sleep -Implement a Mediterranean diet  All questions were answered.      Jennifer Fortune, MD Gastroenterology and Hepatology [facility] Gastroenterology

## 2024-03-25 ENCOUNTER — Encounter (HOSPITAL_COMMUNITY): Admitting: Physical Therapy

## 2024-03-25 DIAGNOSIS — A09 Infectious gastroenteritis and colitis, unspecified: Secondary | ICD-10-CM | POA: Diagnosis not present

## 2024-03-25 DIAGNOSIS — K51011 Ulcerative (chronic) pancolitis with rectal bleeding: Secondary | ICD-10-CM | POA: Diagnosis not present

## 2024-03-25 NOTE — Telephone Encounter (Signed)
Pt aware.

## 2024-03-25 NOTE — Telephone Encounter (Signed)
 Forms faxed to cardiology

## 2024-03-26 ENCOUNTER — Telehealth: Payer: Self-pay

## 2024-03-26 ENCOUNTER — Ambulatory Visit (INDEPENDENT_AMBULATORY_CARE_PROVIDER_SITE_OTHER): Payer: Self-pay | Admitting: Gastroenterology

## 2024-03-26 DIAGNOSIS — K51919 Ulcerative colitis, unspecified with unspecified complications: Secondary | ICD-10-CM

## 2024-03-26 NOTE — Telephone Encounter (Signed)
 Thank you :)

## 2024-03-26 NOTE — Telephone Encounter (Signed)
 Message below from Keyana Dalton from [facility] [employer].

## 2024-03-26 NOTE — Telephone Encounter (Signed)
 Paperwork for sequential compression pumps completed

## 2024-03-26 NOTE — Telephone Encounter (Signed)
 Hi,  Patient will be scheduled as soon as possible.  Auth Submission: APPROVED Site of care: Site of care: [facility] Payer: [employer] medicare Medication & CPT/J Code(s) submitted: Tremfya [id number] Diagnosis Code: K51.011  Route of submission (phone, fax, portal): portal Phone # Fax # Auth type: Buy/Bill PB Units/visits requested: 200mg  x 3doses Reference number: [id number] Approval from: 03/25/24 to 09/29/24

## 2024-03-27 LAB — COMPREHENSIVE METABOLIC PANEL WITH GFR
AG Ratio: 0.7 (calc) — ABNORMAL LOW (ref 1.0–2.5)
ALT: 17 U/L (ref 6–29)
AST: 40 U/L — ABNORMAL HIGH (ref 10–35)
Albumin: 2.9 g/dL — ABNORMAL LOW (ref 3.6–5.1)
Alkaline phosphatase (APISO): 80 U/L (ref 37–153)
BUN: 11 mg/dL (ref 7–25)
CO2: 34 mmol/L — ABNORMAL HIGH (ref 20–32)
Calcium: 8 mg/dL — ABNORMAL LOW (ref 8.6–10.4)
Chloride: 96 mmol/L — ABNORMAL LOW (ref 98–110)
Creat: 0.87 mg/dL (ref 0.60–1.00)
Globulin: 4.1 g/dL — ABNORMAL HIGH (ref 1.9–3.7)
Glucose, Bld: 282 mg/dL — ABNORMAL HIGH (ref 65–99)
Potassium: 3.5 mmol/L (ref 3.5–5.3)
Sodium: 138 mmol/L (ref 135–146)
Total Bilirubin: 0.6 mg/dL (ref 0.2–1.2)
Total Protein: 7 g/dL (ref 6.1–8.1)
eGFR: 69 mL/min/{1.73_m2} (ref >=60)

## 2024-03-27 LAB — CBC WITH DIFFERENTIAL/PLATELET
Absolute Lymphocytes: 1354 {cells}/uL (ref 850–3900)
Absolute Monocytes: 550 {cells}/uL (ref 200–950)
Basophils Absolute: 28 {cells}/uL (ref 0–200)
Basophils Relative: 0.6 %
Eosinophils Absolute: 329 {cells}/uL (ref 15–500)
Eosinophils Relative: 7 %
HCT: 29.4 % — ABNORMAL LOW (ref 35.0–45.0)
Hemoglobin: 8.7 g/dL — ABNORMAL LOW (ref 11.7–15.5)
MCH: 25.7 pg — ABNORMAL LOW (ref 27.0–33.0)
MCHC: 29.6 g/dL — ABNORMAL LOW (ref 32.0–36.0)
MCV: 86.7 fL (ref 80.0–100.0)
MPV: 10.1 fL (ref 7.5–12.5)
Monocytes Relative: 11.7 %
Neutro Abs: 2439 {cells}/uL (ref 1500–7800)
Neutrophils Relative %: 51.9 %
Platelets: 183 10*3/uL (ref 140–400)
RBC: 3.39 10*6/uL — ABNORMAL LOW (ref 3.80–5.10)
RDW: 17.3 % — ABNORMAL HIGH (ref 11.0–15.0)
Total Lymphocyte: 28.8 %
WBC: 4.7 10*3/uL (ref 3.8–10.8)

## 2024-03-27 LAB — C. DIFFICILE GDH AND TOXIN A/B
GDH ANTIGEN: NOT DETECTED
MICRO NUMBER:: 16632086
SPECIMEN QUALITY:: ADEQUATE
TOXIN A AND B: NOT DETECTED

## 2024-03-27 LAB — GASTROINTESTINAL PATHOGEN PNL
CampyloBacter Group: NOT DETECTED
Norovirus GI/GII: NOT DETECTED
Rotavirus A: NOT DETECTED
Salmonella species: NOT DETECTED
Shiga Toxin 1: NOT DETECTED
Shiga Toxin 2: NOT DETECTED
Shigella Species: NOT DETECTED
Vibrio Group: NOT DETECTED
Yersinia enterocolitica: NOT DETECTED

## 2024-03-27 LAB — QUANTIFERON-TB GOLD PLUS
Mitogen-NIL: 8.91 [IU]/mL
NIL: 0.03 [IU]/mL
QuantiFERON-TB Gold Plus: NEGATIVE
TB1-NIL: 0 [IU]/mL
TB2-NIL: 0 [IU]/mL

## 2024-03-27 LAB — PROTIME-INR
INR: 1.1
Prothrombin Time: 11.8 s — ABNORMAL HIGH (ref 9.0–11.5)

## 2024-03-27 LAB — IRON,TIBC AND FERRITIN PANEL
%SAT: 9 % — ABNORMAL LOW (ref 16–45)
Ferritin: 7 ng/mL — ABNORMAL LOW (ref 16–288)
Iron: 30 ug/dL — ABNORMAL LOW (ref 45–160)
TIBC: 319 ug/dL (ref 250–450)

## 2024-03-27 LAB — C-REACTIVE PROTEIN: CRP: 12.9 mg/L — ABNORMAL HIGH (ref <8.0)

## 2024-03-27 LAB — HEPATITIS B SURFACE ANTIGEN: Hepatitis B Surface Ag: NONREACTIVE

## 2024-03-27 LAB — AFP TUMOR MARKER: AFP-Tumor Marker: 3.6 ng/mL

## 2024-03-29 ENCOUNTER — Ambulatory Visit (HOSPITAL_COMMUNITY): Admitting: Physical Therapy

## 2024-03-29 DIAGNOSIS — I890 Lymphedema, not elsewhere classified: Secondary | ICD-10-CM | POA: Diagnosis not present

## 2024-03-29 NOTE — Telephone Encounter (Signed)
 Please advise if forms faxed

## 2024-03-29 NOTE — Therapy (Signed)
 OUTPATIENT PHYSICAL THERAPY LYMPHEDEMA TREATMENT/Discharge  Patient Name: [name] MRN: [id number] DOB:03-Nov-1945, 78 y.o., female Today's Date: 03/29/2024 PHYSICAL THERAPY DISCHARGE SUMMARY  Visits from Start of Care: 16  Current functional level related to goals / functional outcomes: Goals met   Remaining deficits: Need pump and thigh high juxta which should be delivered in a week    Education / Equipment: How to don juxta and new exercise program    Patient agrees to discharge. Patient goals were met. Patient is being discharged due to meeting the stated rehab goals.    END OF SESSION:  PT End of Session - 03/29/24 1719     Visit Number 19    Number of Visits 19    Date for PT Re-Evaluation 03/25/24    Authorization Type [employer] new auth requested    Authorization - Number of Visits 18    Progress Note Due on Visit 24    PT Start Time 1558    PT Stop Time 1701    PT Time Calculation (min) 63 min    Activity Tolerance Patient tolerated treatment well    Behavior During Therapy WFL for tasks assessed/performed               Past Medical History:  Diagnosis Date   Carpal tunnel syndrome of right wrist 03/2013   recurrent   Chronic diastolic heart failure (HCC)    Cirrhosis, 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 ? to get liver bx if she gets bariatric surgery? Mild portal hypertensive gastropathy on EGD 08/2019.   Fibromyalgia    GAD (generalized anxiety disorder)    GERD    History of hiatal hernia    History of iron  deficiency anemia 12/2018   Inadequate absorption secondary to chronic/long term PPI therapy + portal hypertensive gastroduodenopathy. No GIB found on EGD, colonosc, and givens. Iron  infusions X multiple.   History of thrombocytopenia 12/2011   Hyperlipidemia    Intolerant of statins   HYPERTENSION    IBS (irritable bowel syndrome)    -D.  Good response to bentyl  and imodium  as of  06/2018 GI f/u.   IDDM (insulin  dependent diabetes mellitus)    with DPN (managed by Dr. Trixie but then in 2018 pt preferred to have me manage for her convenience)   Limited mobility    Requires a walker for arthritic pain, widespread musculoskeletal pain, and neuropathic pain.   Morbid obesity (HCC)    As of 11/2018, pt considering sleeve gastectomy vs bipass as of eval by Dr. Gael considering as of 12/2019.   Nonalcoholic steatohepatitis    Viral Hep screens NEG.  CT 2015.  Transaminasemia.  U/S 07/2018 showed early changes of cirrhosis.   OSA (obstructive sleep apnea) 09/14/2015   sleep study 09/07/15: severe obstructive sleep apnea with an AHI of 72 and SaO2 low of 75%.>referred to sleep MD   Osteoarthritis    hips, shoulders, knees   PAF (paroxysmal atrial fibrillation) (HCC)    One documented episode (after getting EGD 2016).  Was on amiodarone  x 3 mo.  Rate control with metoprolol  + anticoag with xarelto . Watchman 12/2022, off anticoag   Portal hypertensive gastropathy (HCC)    hemorrhagic gastropathy + non-bleeding gastric ulcers on EGD 08/2023   Recurrent epistaxis    Granuloma in L nare cauterized by ENT 04/2020. Another cautery 06/2020   Small fiber neuropathy    Due to DM.  Symmetric hands and feet  tingling/numbness.   Ulcerative colitis (HCC)    Remicade  infusion Q 8 weeks: in clinical and endoscopic remission as of 12/2018 GI f/u.  06/11/19 rpt colonoscopy->cecal and ascending colon colitis.   Past Surgical History:  Procedure Laterality Date   ABDOMINAL HYSTERECTOMY  1980   Paps no longer indicated.   BACK SURGERY     BACTERIAL OVERGROWTH TEST N/A 07/13/2015   Procedure: BACTERIAL OVERGROWTH TEST;  Surgeon: Claudis RAYMOND Rivet, MD;  [facility]: [facility];  Service: Endoscopy;  Laterality: N/A;  730     BILATERAL SALPINGOOPHORECTOMY  02/10/2001   BIOPSY  06/11/2019   Procedure: BIOPSY;  Surgeon: Rivet Claudis RAYMOND, MD;  Location: [facility];  Service: Endoscopy;;   colon   BIOPSY  09/27/2019   Procedure: BIOPSY;  Surgeon: Rivet Claudis RAYMOND, MD;  [facility]: [facility];  Service: Endoscopy;;  gastric duodenal   BIOPSY  09/15/2020   Procedure: BIOPSY;  Surgeon: Eartha Angelia Sieving, MD;  [facility]: [facility];  Service: Gastroenterology;;   BIOPSY  09/25/2023   Procedure: BIOPSY;  Surgeon: Eartha Angelia Sieving, MD;  [facility]: [facility];  Service: Gastroenterology;;   BREAST REDUCTION SURGERY  1994   bilat   BREAST SURGERY     CARDIOVASCULAR STRESS TEST  07/2010   Lexiscan myoview: normal   CARPAL TUNNEL RELEASE Right 1996   CARPAL TUNNEL RELEASE Left 03/21/2003   CARPAL TUNNEL RELEASE Right 05/04/2013   Procedure: CARPAL TUNNEL RELEASE;  Surgeon: Lamar LULLA Leonor Mickey., MD;  Location: [facility];  Service: Orthopedics;  Laterality: Right;   CARPAL TUNNEL RELEASE Left 09/21/2013   Procedure: LEFT CARPAL TUNNEL RELEASE;  Surgeon: Lamar LULLA Leonor Mickey., MD;  Location: [facility];  Service: Orthopedics;  Laterality: Left;   CHOLECYSTECTOMY     COLONOSCOPY WITH PROPOFOL  N/A 08/04/2015   Colitis in remission.  No polyps.  Procedure: COLONOSCOPY WITH PROPOFOL ;  Surgeon: Claudis RAYMOND Rivet, MD;  Location: [facility];  Service: Endoscopy;  Laterality: N/A;  cecum time in  0820   time out  0827    total time 7 minutes   COLONOSCOPY WITH PROPOFOL  N/A 06/11/2019   cecal and ascending colon colitis.  Procedure: COLONOSCOPY WITH PROPOFOL ;  Surgeon: Rivet Claudis RAYMOND, MD;  [facility]: [facility];  Service: Endoscopy;  Laterality: N/A;  730a   COLONOSCOPY WITH PROPOFOL  N/A 09/15/2020   Procedure: COLONOSCOPY WITH PROPOFOL ;  Surgeon: Eartha Angelia Sieving, MD;  [facility]: [facility];  Service: Gastroenterology;  Laterality: N/A;  1030   ESOPHAGEAL DILATION N/A 08/04/2015   Procedure: ESOPHAGEAL DILATION;  Surgeon: Claudis RAYMOND Rivet, MD;  Location: [facility];  Service: Endoscopy;  Laterality: N/AMERL [place] MOH, no mucousal disruption    ESOPHAGOGASTRODUODENOSCOPY  09/27/2019   Performed for IDA.  Esoph dilation was done but no stricture present.  Mild portal hypertensive gastropathy, o/w normal.  Duodenal bx NEG.  h pylori neg.   ESOPHAGOGASTRODUODENOSCOPY     09/25/2023 inactive chronic gastritis, h pylor neg. benign   ESOPHAGOGASTRODUODENOSCOPY     09/25/23 hemorrhagic portal gastropathy + nonbleeding gastric ulcers (H pylori neg)   ESOPHAGOGASTRODUODENOSCOPY (EGD) WITH ESOPHAGEAL DILATION  12/02/2005   ESOPHAGOGASTRODUODENOSCOPY (EGD) WITH PROPOFOL  N/A 08/04/2015   Procedure: ESOPHAGOGASTRODUODENOSCOPY (EGD) WITH PROPOFOL ;  Surgeon: Claudis RAYMOND Rivet, MD;  Location: [facility];  Service: Endoscopy;  Laterality: N/A;  procedure 1   ESOPHAGOGASTRODUODENOSCOPY (EGD) WITH PROPOFOL  N/A 09/27/2019   Procedure: ESOPHAGOGASTRODUODENOSCOPY (EGD) WITH PROPOFOL ;  Surgeon: Rivet Claudis RAYMOND, MD;  [facility];  Service: Endoscopy;  Laterality: N/A;  12:10   ESOPHAGOGASTRODUODENOSCOPY (EGD) WITH PROPOFOL  N/A 10/31/2021   Procedure: ESOPHAGOGASTRODUODENOSCOPY (EGD) WITH PROPOFOL ;  Surgeon: Golda Claudis PENNER, MD;  [facility];  Service: Endoscopy;  Laterality: N/A;  930   ESOPHAGOGASTRODUODENOSCOPY (EGD) WITH PROPOFOL  N/A 09/25/2023   Procedure: ESOPHAGOGASTRODUODENOSCOPY (EGD) WITH PROPOFOL ;  Surgeon: Eartha Angelia Sieving, MD;  [facility]: [facility];  Service: Gastroenterology;  Laterality: N/A;  10:30AM;ASA 3   FLEXIBLE SIGMOIDOSCOPY  01/17/2012   Procedure: FLEXIBLE SIGMOIDOSCOPY;  Surgeon: Claudis PENNER Golda, MD;  [facility]: [facility];  Service: Endoscopy;  Laterality: N/A;   GIVENS CAPSULE STUDY N/A 08/03/2019   Procedure: GIVENS CAPSULE STUDY (performed for IDA)->some food debris in stomach and small amount of blood.  Surgeon: Golda Claudis PENNER, MD;  [facility]: [facility];  Service: Endoscopy;  Laterality: N/A;  730AM   HEMILAMINOTOMY LUMBAR SPINE Bilateral 09/07/1999   L4-5   HOT HEMOSTASIS  09/25/2023    Procedure: HOT HEMOSTASIS (ARGON PLASMA COAGULATION/BICAP);  Surgeon: Eartha Angelia, Sieving, MD;  [facility];  Service: Gastroenterology;;   KNEE ARTHROSCOPY Right 01/1999; 10/2000   LEFT ATRIAL APPENDAGE OCCLUSION N/A 01/23/2023   Procedure: LEFT ATRIAL APPENDAGE OCCLUSION;  Surgeon: Wonda Sharper, MD;  Location: [facility] INVASIVE CV LAB;  Service: Cardiovascular;  Laterality: N/A;   LEFT ATRIAL APPENDAGE OCCLUSION     LYSIS OF ADHESION  02/10/2001   MALONEY DILATION  09/27/2019   Procedure: MALONEY DILATION;  Surgeon: Golda Claudis PENNER, MD;  [facility];  Service: Endoscopy;;   NASAL ENDOSCOPY WITH EPISTAXIS CONTROL Bilateral 01/25/2022   Procedure: NASAL ENDOSCOPY WITH EPISTAXIS CONTROL;  Surgeon: Mable Lenis, MD;  Location: [facility];  Service: ENT;  Laterality: Bilateral;   NASAL SEPTOPLASTY W/ TURBINOPLASTY Bilateral 01/25/2022   Procedure: NASAL SEPTOPLASTY WITH TURBINATE REDUCTION;  Surgeon: Mable Lenis, MD;  Location: [facility];  Service: ENT;  Laterality: Bilateral;   RECTOCELE REPAIR  1990; 09/12/2006   TARSAL TUNNEL RELEASE  2002   TEE WITHOUT CARDIOVERSION N/A 01/23/2023   Procedure: TRANSESOPHAGEAL ECHOCARDIOGRAM;  Surgeon: Wonda Sharper, MD;  Location: [facility] INVASIVE CV LAB;  Service: Cardiovascular;  Laterality: N/A;   TRANSESOPHAGEAL ECHOCARDIOGRAM (CATH LAB) N/A 08/26/2023   4mm leak around watchman device. Procedure: TRANSESOPHAGEAL ECHOCARDIOGRAM;  Surgeon: Loni Soyla LABOR, MD;  Location: [facility] INVASIVE CV LAB;  Service: Cardiovascular;  Laterality: N/A;   TRANSTHORACIC ECHOCARDIOGRAM  08/04/2015   EF 60-65%, normal wall motion, mild LVH, mild LA dilation, grd I DD.  11/2023 overall poor quality, EF 55-60%   TUMOR EXCISION Left 03/21/2003   dorsal 1st web space (hand)   URETEROLYSIS Right 02/10/2001   Patient Active Problem List   Diagnosis Date Noted   Acute on chronic diastolic CHF (congestive heart failure) (HCC) 10/31/2023   Leakage of Watchman  left atrial appendage closure device 08/26/2023   Atrial fibrillation (HCC) 01/23/2023   Gastroenteritis, infectious, presumed 11/14/2022   Acute metabolic encephalopathy 10/09/2022   Back pain 10/09/2022   Diabetes mellitus (HCC) 10/09/2022   Hypoglycemia 10/09/2022   Chronic ulcerative pancolitis (HCC) 08/21/2021   Diarrhea 10/03/2020   Cirrhosis, nonalcoholic (HCC) 10/03/2020   Fecal urgency 08/02/2020   Deviated septum 07/28/2020   Epistaxis, recurrent 05/15/2020   Ulcerative colitis without complications (HCC) 08/24/2019   Gastrointestinal hemorrhage 08/24/2019   Iron  deficiency anemia due to chronic blood loss 07/28/2019   Iron  deficiency anemia 05/20/2019   Heme positive stool 05/20/2019   History of iron   deficiency anemia 12/2018   Vertigo 12/04/2015   Uncontrolled type 2 diabetes mellitus with peripheral neuropathy 11/14/2015   OSA (obstructive sleep apnea), severe 09/14/2015   B12 deficiency 08/17/2015   Vitamin B6 deficiency 08/17/2015   PAF (paroxysmal atrial fibrillation) (HCC) 08/17/2015   Chronic anticoagulation 08/17/2015   Atrial fibrillation with RVR (HCC) 08/04/2015   Dysphagia    Gastroesophageal reflux disease without esophagitis    Lymphocytosis 07/04/2015   Obesity 05/18/2014   Allergic sinusitis 12/09/2013   Myalgia 04/28/2012   Polyarthralgia 04/28/2012   Elevated LFTs 01/16/2012   Insomnia 01/12/2012   Small fiber neuropathy 05/30/2011   Anxiety 05/30/2011   Dyslipidemia 01/24/2011   PALPITATIONS, RECURRENT 08/08/2010   Essential hypertension 04/12/2010   GERD 04/12/2010   Ulcerative colitis (HCC) 04/12/2010    PCP: Lowella Benton CROME, PA  REFERRING PROVIDER: Lowella Benton CROME, PA  REFERRING DIAG: I87.2 (ICD-10-CM) - Venous insufficiency of both lower extremities I89.0,E66.9 (ICD-10-CM) - Lymphedema associated with obesity I87.2 (ICD-10-CM) - Venous stasis dermatitis of both lower extremities  THERAPY DIAG:  Lymphedema, not elsewhere  classified  Rationale for Evaluation and Treatment: Rehabilitation  ONSET DATE: chronic but has increased significantly in the past year.   SUBJECTIVE:                                                                                                                                                                                           SUBJECTIVE STATEMENT: PT states she has put her garments on.  PERTINENT HISTORY:  [name] is a 78 y.o. female with past medical history significant for chronic diastolic congestive heart failure, proximal atrial fibrillation not on anticoagulation due to recurrent epistaxis s/p Watchman procedure (12/2022), CAD on aspirin , type 2 diabetes mellitus, anxiety/depression, nonalcoholic cirrhosis, OSA on CPAP, morbid obesity hospitalized for acute CHF from 10/31/23-11/04/2574 .     PAIN:  Are you having pain?   NPRS scale: 0/10  PRECAUTIONS: Other: cellulitis    RED FLAGS: None   WEIGHT BEARING RESTRICTIONS: No  FALLS:  Has patient fallen in last 6 months? No  LIVING ENVIRONMENT: Lives with: lives with their family Lives in: House/apartment Stairs: Yes: Internal: 2 steps; none and External: 2 steps; none Has following equipment at home: Quad cane small base  PATIENT GOALS: less swelling    OBJECTIVE: Note: Objective measures were completed at Evaluation unless otherwise noted.  COGNITION: Overall cognitive status: Within functional limits for tasks assessed   PALPATION: Increased induration greater in LE but noted in thigh area as well   OBSERVATIONS / OTHER ASSESSMENTS: slight redness and thickening of skin   POSTURE: forward head,  rounded shoulders and increased kyphosis  All within normal limits:   LYMPHEDEMA ASSESSMENTS:    LE LANDMARK RIGHT eval Right 01/29/24 02/04/24 02/18/24 02/26/24 03/03/24 03/10/24 03/29/24  At groin          30 cm proximal to suprapatella          20 cm proximal to suprapatella   63.7 64 63.2 63.2   62.2 61   10 cm proximal to suprapatella   61.9 63.5 59.5 58.9 58 58.7  At midpatella / popliteal crease 52.5 46.3 49.5 49.8 46.7 48 44.8 45  30 cm proximal to floor at lateral plantar foot 53.5 45.3 46.8 47.3 45.7 45.5 44.2 44.2  20 cm proximal to floor at lateral plantar foot 44  36.6 38.7 37.5 35.8 35.1 34.7 36.2  10 cm proximal to floor at lateral plantar foot 29 27.2 27.7 27.5 26.8 26.9 26.8 26.4  Circumference of ankle/heel 33.5 33.4 34 33.3 32.8 33 33.8 32.8  5 cm proximal to 1st MTP joint 24 24 23.8 23.2 23.3 22.8 23.4 22.8  Across MTP joint 23 22.8 23 24  22.8 22.3 23 23   Around proximal great toe          (Blank rows = not tested)  LE LANDMARK LEFT eval Left 01/29/24 02/04/24/ 02/18/24 02/26/24 02/21/24 03/10/24 03/29/24  At groin          30 cm proximal to suprapatella          20 cm proximal to suprapatella   66 63.5 63.5 63.5 62.5 61.8  10 cm proximal to suprapatella   63 63.8 61.8 60 60.5 61.5  At midpatella / popliteal crease 53 47 48.5 50.2 46.8 47 48 46.2  30 cm proximal to floor at lateral plantar foot 54.2 50.2 49.4 47 47.2 46 45.8 45  20 cm proximal to floor at lateral plantar foot 40.5 40.5 38.5 40.7 40 38.5 38.5 37.4  10 cm proximal to floor at lateral plantar foot 32.3 29.5 30.3 30 29.5 29 29.2 29  Circumference of ankle/heel 35.4 34 34.7 33.7 34.6 34.8 34 33.9  5 cm proximal to 1st MTP joint 24.3 23.6 23.2 23.5 23.1 23 23  22.8  Across MTP joint 23.3 22.5 22.7 22.9 22.5 22.2 22.7 22.2   Around proximal great toe          (Blank rows = not tested)  FUNCTIONAL TESTS:  Gait : 1:30 minutes to ambulate with quad cane x 80 ft  Evaluation  03/12/24 able to ambulate 80 ft in 1:06   TODAY'S TREATMENT:                                                                                                                              DATE:  03/29/24:Measurement Education on donning juxta fit.  Pt has not received thigh high therefore therapist called [facility] while pt and husband present.   Ordered both pump and thigh high juxtafit over the phone with  husband giving company card number.  Therapist gave Giovanna cardiologist number for the pump. Gave pt new exercise sheet with additional exercises.    PATIENT EDUCATION: 03/29/24:  Discharge instruction stressing the importance of moisturizing the skin every day.  The importance of donning the juxta and keeping them donned all day long, (pt comes in without juxta's donned).  New HEP   Education details: skin care recommending using Eucerin or Aquaphor, exercise to include ankle pumps, LAQ, hip ab/adduction, marching and diaphragm breath, what manual will be like as well as multilayer compression dressing.  Person educated: Patient and Spouse Education method: Explanation, Verbal cues, and Handouts Education comprehension: verbalized understanding and returned demonstration  HOME EXERCISE PROGRAM: 03/29/24:  Access Code: [email or web address] URL: [email or web address] Date: 03/29/2024 Prepared by: Montie Metro  Exercises - Seated Diaphragmatic Breathing  - 1 x daily - 7 x weekly - 10 reps - 5 hold - Seated Cervical Sidebending AROM  - 1 x daily - 7 x weekly - 1 sets - 10 reps - 2-3 hold - Seated Cervical Rotation AROM  - 1 x daily - 7 x weekly - 1 sets - 10 reps - 2-3 hold - Seated Cervical Extension AROM  - 1 x daily - 7 x weekly - 1 sets - 10 reps - 2-3 hold - Seated Cervical Retraction  - 1 x daily - 7 x weekly - 1 sets - 10 reps - 2-3 hold - Shoulder Rolls in Sitting  - 1 x daily - 7 x weekly - 1 sets - 10 reps - 2-3 hold - Seated Sidebending Arms Overhead  - 1 x daily - 7 x weekly - 1 sets - 10 reps - 2-3 hold - Seated Hip Abduction  - 1 x daily - 7 x weekly - 1 sets - 10 reps - 2-3 hold - Seated Long Arc Quad  - 1 x daily - 7 x weekly - 1 sets - 10 reps - 2-3 hold - Seated Heel Toe Raises  - 1 x daily - 7 x weekly - 1 sets - 10 reps - 2-3 hold - Seated Toe Curl  - 1 x daily - 7 x weekly - 1 sets - 10 reps - 2-3  hold   Evaluation:  ankle pumps, LAQ, hip ab/adduction, marching and diaphragm breath  ASSESSMENT:  CLINICAL IMPRESSION: Pt measurements on Rt LE are mixed with the Lt being down. PT has not been to treatment for 2 weeks.  Pt has received her ankle foot and leg juxtafit but not her thigh and pump.  Therapist called [facility] and pump and thigh highs were measured over the phone.  PT able to ambulate 20 more feet in a 1:30 time today compared with eval.  Therapist only did a 1:30 minute walk at eval due to pt not being able to walk any longer.  PT is now walking for 10 minutes without stopping at home. Pt and husband are confident on exercise and donning sleeves.  We will discharge formal therapy at this time.  OBJECTIVE IMPAIRMENTS: cardiopulmonary status limiting activity, decreased activity tolerance, decreased mobility, difficulty walking, decreased ROM, decreased strength, increased edema, and obesity.   ACTIVITY LIMITATIONS: carrying, lifting, stairs, bathing, dressing, and locomotion level  PARTICIPATION LIMITATIONS: shopping and community activity  PERSONAL FACTORS: Age, Fitness, Time since onset of injury/illness/exacerbation, and 1-2 comorbidities: CHF,  are also affecting patient's functional outcome.   REHAB POTENTIAL: Good  CLINICAL DECISION MAKING: Evolving/moderate complexity  EVALUATION COMPLEXITY: Moderate  GOALS: Goals reviewed  with patient? Yes  SHORT TERM GOALS: Target date: 02/10/24  PT to be I in HEP to improve lymphatic circulation Baseline: Goal status: met  2.  PT to have lost 1-2 cm in her feet to be able to fit in her shoes, 2-3 cm in legs for improved skin integrity. Baseline:  Goal status: met   LONG TERM GOALS: Target date: 03/02/24  PT to have lost 2 -3 cm in her feet, 3-5 cm in her LE for decreased risk of cellulitis.  Baseline:  Goal status: met  2.  Pt to have aqcuired juxtafit compression garment and be able to don to be able to continue  maintenance phase of treatment.  Baseline:  Goal status: met   3.  PT to be I in self manual to assist in increasing lymphatic circulation Baseline:  Goal status: met  PLAN:  PT FREQUENCY: 3x/week  PT DURATION: 6 weeks an additional 2 weeks.   PLANNED INTERVENTIONS: 97110-Therapeutic exercises, 97535- Self Care, and 02859- Manual therapy  PLAN FOR NEXT SESSION: Discharge at this time.   Montie Metro, PT CLT [phone]  03/29/2024, 5:19 PM

## 2024-03-30 ENCOUNTER — Other Ambulatory Visit: Payer: Self-pay | Admitting: Family Medicine

## 2024-03-30 ENCOUNTER — Encounter (HOSPITAL_COMMUNITY): Admitting: Physical Therapy

## 2024-03-30 NOTE — Telephone Encounter (Signed)
 Discussed with CMA via Teams

## 2024-03-30 NOTE — Telephone Encounter (Signed)
 Forms faxed and placed up front for scan

## 2024-03-31 ENCOUNTER — Encounter (HOSPITAL_COMMUNITY): Payer: Self-pay

## 2024-03-31 ENCOUNTER — Ambulatory Visit (HOSPITAL_COMMUNITY): Attending: Gastroenterology

## 2024-04-01 ENCOUNTER — Encounter (HOSPITAL_COMMUNITY): Admitting: Physical Therapy

## 2024-04-01 ENCOUNTER — Other Ambulatory Visit (INDEPENDENT_AMBULATORY_CARE_PROVIDER_SITE_OTHER): Payer: Self-pay | Admitting: Gastroenterology

## 2024-04-01 MED ORDER — PREDNISONE 10 MG PO TABS: ORAL_TABLET | ORAL | 0 refills | Status: DC

## 2024-04-05 ENCOUNTER — Other Ambulatory Visit (INDEPENDENT_AMBULATORY_CARE_PROVIDER_SITE_OTHER): Payer: Self-pay | Admitting: Gastroenterology

## 2024-04-05 ENCOUNTER — Telehealth: Payer: Self-pay

## 2024-04-05 NOTE — Telephone Encounter (Addendum)
 Hello,  Patient will be scheduled as soon as possible.   Auth Submission: NO AUTH NEEDED Site of care: Site of care: [facility] Payer: [employer] Medication & CPT/J Code(s) submitted: Venofer (Iron  Sucrose) J1756 Diagnosis Code:  Route of submission (phone, fax, portal): portal Phone # Fax # Auth type: Buy/Bill PB Units/visits requested: 200mg  x 5 doses Reference number:  Approval from: 04/05/24 to 09/29/24

## 2024-04-05 NOTE — Progress Notes (Deleted)
 Cardiology Clinic Note   Patient Name: [name] Date of Encounter: 04/05/2024  Primary Care Provider:  Candise Aleene DEL, MD Primary Cardiologist:  Redell Shallow, MD  Patient Profile    [name] 78 year old female presents the clinic today for follow-up evaluation of her chronic diastolic CHF.  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 ? to get liver bx if she gets bariatric surgery? Mild portal hypertensive gastropathy on EGD 08/2019.   Fibromyalgia    GAD (generalized anxiety disorder)    GERD    History of hiatal hernia    History of iron  deficiency anemia 12/2018   Inadequate absorption secondary to chronic/long term PPI therapy + portal hypertensive gastroduodenopathy. No GIB found on EGD, colonosc, and givens. Iron  infusions X multiple.   History of thrombocytopenia 12/2011   Hyperlipidemia    Intolerant of statins   HYPERTENSION    IBS (irritable bowel syndrome)    -D.  Good response to bentyl  and imodium  as of 06/2018 GI f/u.   IDDM (insulin  dependent diabetes mellitus)    with DPN (managed by Dr. Trixie but then in 2018 pt preferred to have me manage for her convenience)   Limited mobility    Requires a walker for arthritic pain, widespread musculoskeletal pain, and neuropathic pain.   Morbid obesity (HCC)    As of 11/2018, pt considering sleeve gastectomy vs bipass as of eval by Dr. Gael considering as of 12/2019.   Nonalcoholic steatohepatitis    Viral Hep screens NEG.  CT 2015.  Transaminasemia.  U/S 07/2018 showed early changes of cirrhosis.   OSA (obstructive sleep apnea) 09/14/2015   sleep study 09/07/15: severe obstructive sleep apnea with an AHI of 72 and SaO2 low of 75%.>referred to sleep MD   Osteoarthritis    hips,  shoulders, knees   PAF (paroxysmal atrial fibrillation) (HCC)    One documented episode (after getting EGD 2016).  Was on amiodarone  x 3 mo.  Rate control with metoprolol  + anticoag with xarelto . Watchman 12/2022, off anticoag   Portal hypertensive gastropathy (HCC)    hemorrhagic gastropathy + non-bleeding gastric ulcers on EGD 08/2023   Recurrent epistaxis    Granuloma in L nare cauterized by ENT 04/2020. Another cautery 06/2020   Small fiber neuropathy    Due to DM.  Symmetric hands and feet tingling/numbness.   Ulcerative colitis (HCC)    Remicade  infusion Q 8 weeks: in clinical and endoscopic remission as of 12/2018 GI f/u.  06/11/19 rpt colonoscopy->cecal and ascending colon colitis.   Past Surgical History:  Procedure Laterality Date   ABDOMINAL HYSTERECTOMY  1980   Paps no longer indicated.   BACK SURGERY     BACTERIAL OVERGROWTH TEST N/A 07/13/2015   Procedure: BACTERIAL OVERGROWTH TEST;  Surgeon: Claudis RAYMOND Rivet, MD;  [facility];  Service: Endoscopy;  Laterality: N/A;  730     BILATERAL SALPINGOOPHORECTOMY  02/10/2001   BIOPSY  06/11/2019   Procedure: BIOPSY;  Surgeon: Rivet Claudis RAYMOND, MD;  [facility]: [facility];  Service: Endoscopy;;  colon   BIOPSY  09/27/2019   Procedure: BIOPSY;  Surgeon: Rivet Claudis RAYMOND, MD;  [facility];  Service: Endoscopy;;  gastric duodenal   BIOPSY  09/15/2020  Procedure: BIOPSY;  Surgeon: Eartha Angelia Sieving, MD;  [facility];  Service: Gastroenterology;;   BIOPSY  09/25/2023   Procedure: BIOPSY;  Surgeon: Eartha Angelia Sieving, MD;  [facility];  Service: Gastroenterology;;   BREAST REDUCTION SURGERY  1994   bilat   BREAST SURGERY     CARDIOVASCULAR STRESS TEST  07/2010   Lexiscan myoview: normal   CARPAL TUNNEL RELEASE Right 1996   CARPAL TUNNEL RELEASE Left 03/21/2003   CARPAL TUNNEL RELEASE Right 05/04/2013   Procedure: CARPAL TUNNEL RELEASE;  Surgeon: Lamar LULLA Leonor Mickey., MD;   Location: [facility];  Service: Orthopedics;  Laterality: Right;   CARPAL TUNNEL RELEASE Left 09/21/2013   Procedure: LEFT CARPAL TUNNEL RELEASE;  Surgeon: Lamar LULLA Leonor Mickey., MD;  Location: [facility];  Service: Orthopedics;  Laterality: Left;   CHOLECYSTECTOMY     COLONOSCOPY WITH PROPOFOL  N/A 08/04/2015   Colitis in remission.  No polyps.  Procedure: COLONOSCOPY WITH PROPOFOL ;  Surgeon: Claudis RAYMOND Rivet, MD;  [facility]: [facility];  Service: Endoscopy;  Laterality: N/A;  cecum time in  0820   time out  0827    total time 7 minutes   COLONOSCOPY WITH PROPOFOL  N/A 06/11/2019   cecal and ascending colon colitis.  Procedure: COLONOSCOPY WITH PROPOFOL ;  Surgeon: Rivet Claudis RAYMOND, MD;  [facility];  Service: Endoscopy;  Laterality: N/A;  730a   COLONOSCOPY WITH PROPOFOL  N/A 09/15/2020   Procedure: COLONOSCOPY WITH PROPOFOL ;  Surgeon: Eartha Angelia Sieving, MD;  [facility];  Service: Gastroenterology;  Laterality: N/A;  1030   ESOPHAGEAL DILATION N/A 08/04/2015   Procedure: ESOPHAGEAL DILATION;  Surgeon: Claudis RAYMOND Rivet, MD;  Location: [facility];  Service: Endoscopy;  Laterality: N/AMERL Agapito MOH, no mucousal disruption   ESOPHAGOGASTRODUODENOSCOPY  09/27/2019   Performed for IDA.  Esoph dilation was done but no stricture present.  Mild portal hypertensive gastropathy, o/w normal.  Duodenal bx NEG.  h pylori neg.   ESOPHAGOGASTRODUODENOSCOPY     09/25/2023 inactive chronic gastritis, h pylor neg. benign   ESOPHAGOGASTRODUODENOSCOPY     09/25/23 hemorrhagic portal gastropathy + nonbleeding gastric ulcers (H pylori neg)   ESOPHAGOGASTRODUODENOSCOPY (EGD) WITH ESOPHAGEAL DILATION  12/02/2005   ESOPHAGOGASTRODUODENOSCOPY (EGD) WITH PROPOFOL  N/A 08/04/2015   Procedure: ESOPHAGOGASTRODUODENOSCOPY (EGD) WITH PROPOFOL ;  Surgeon: Claudis RAYMOND Rivet, MD;  Location: [facility];  Service: Endoscopy;  Laterality: N/A;  procedure 1   ESOPHAGOGASTRODUODENOSCOPY (EGD)  WITH PROPOFOL  N/A 09/27/2019   Procedure: ESOPHAGOGASTRODUODENOSCOPY (EGD) WITH PROPOFOL ;  Surgeon: Rivet Claudis RAYMOND, MD;  [facility];  Service: Endoscopy;  Laterality: N/A;  12:10   ESOPHAGOGASTRODUODENOSCOPY (EGD) WITH PROPOFOL  N/A 10/31/2021   Procedure: ESOPHAGOGASTRODUODENOSCOPY (EGD) WITH PROPOFOL ;  Surgeon: Rivet Claudis RAYMOND, MD;  [facility];  Service: Endoscopy;  Laterality: N/A;  930   ESOPHAGOGASTRODUODENOSCOPY (EGD) WITH PROPOFOL  N/A 09/25/2023   Procedure: ESOPHAGOGASTRODUODENOSCOPY (EGD) WITH PROPOFOL ;  Surgeon: Eartha Angelia Sieving, MD;  [facility];  Service: Gastroenterology;  Laterality: N/A;  10:30AM;ASA 3   FLEXIBLE SIGMOIDOSCOPY  01/17/2012   Procedure: FLEXIBLE SIGMOIDOSCOPY;  Surgeon: Claudis RAYMOND Rivet, MD;  [facility];  Service: Endoscopy;  Laterality: N/A;   GIVENS CAPSULE STUDY N/A 08/03/2019   Procedure: GIVENS CAPSULE STUDY (performed for IDA)->some food debris in stomach and small amount of blood.  Surgeon: Rivet Claudis RAYMOND, MD;  [facility];  Service: Endoscopy;  Laterality: N/A;  730AM   HEMILAMINOTOMY LUMBAR  SPINE Bilateral 09/07/1999   L4-5   HOT HEMOSTASIS  09/25/2023   Procedure: HOT HEMOSTASIS (ARGON PLASMA COAGULATION/BICAP);  Surgeon: Eartha Flavors, Toribio, MD;  [facility];  Service: Gastroenterology;;   KNEE ARTHROSCOPY Right 01/1999; 10/2000   LEFT ATRIAL APPENDAGE OCCLUSION N/A 01/23/2023   Procedure: LEFT ATRIAL APPENDAGE OCCLUSION;  Surgeon: Wonda Sharper, MD;  Location[facility] INVAS[facility] CV LAB;  Service: Cardiovascular;  Laterality: N/A;   LEFT ATRIAL APPENDAGE OCCLUSION     LYSIS OF ADHESION  02/10/2001   MALONEY DILATION  09/27/2019   Procedure: MALONEY DILATION;  Surgeon: Golda Claudis PENNER, MD;  [facility];  Service: Endoscopy;;   NASAL ENDOSCOPY WITH EPISTAXIS CONTROL Bilateral 01/25/2022   Procedure: NASAL ENDOSCOPY WITH EPISTAXIS CONTROL;  Surgeon: Mable Lenis, MD;  Location: [facility] OR;  Service: ENT;  Laterality: Bilateral;   NASAL SEPTOPLASTY W/ TURBINOPLASTY Bilateral 01/25/2022   Procedure: NASAL SEPTOPLASTY WITH TURBINATE REDUCTION;  Surgeon: Mable Lenis, MD;  Location: [facility] OR;  Service: ENT;  Laterality: Bilateral;   RECTOCELE REPAIR  1990; 09/12/2006   TARSAL TUNNEL RELEASE  2002   TEE WITHOUT CARDIOVERSION N/A 01/23/2023   Procedure: TRANSESOPHAGEAL ECHOCARDIOGRAM;  Surgeon: Wonda Sharper, MD;  Location: [facility] INVASIVE CV LAB;  Service: Cardiovascular;  Laterality: N/A;   TRANSESOPHAGEAL ECHOCARDIOGRAM (CATH LAB) N/A 08/26/2023   4mm leak around watchman device. Procedure: TRANSESOPHAGEAL ECHOCARDIOGRAM;  Surgeon: Loni Soyla LABOR, MD;  Location: [facility] INVASIVE CV LAB;  Service: Cardiovascular;  Laterality: N/A;   TRANSTHORACIC ECHOCARDIOGRAM  08/04/2015   EF 60-65%, normal wall motion, mild LVH, mild LA dilation, grd I DD.  11/2023 overall poor quality, EF 55-60%   TUMOR EXCISION Left 03/21/2003   dorsal 1st web space (hand)   URETEROLYSIS Right 02/10/2001    Allergies  Allergies  Allergen Reactions   Flagyl  [Metronidazole  Hcl] Other (See Comments)    DIAPHORESIS   Omeprazole  Anaphylaxis and Swelling    SWELLING OF TONGUE AND THROAT   Pioglitazone Other (See Comments) and Swelling    Weight gain, tongue swelling   Benzocaine-Menthol Swelling    SWELLING OF MOUTH   Metformin  And Related Diarrhea   Shrimp [Shellfish Allergy] Itching    OF THROAT AND EARS, if consumed raw   Statins Palpitations   Welchol [Colesevelam] Other (See Comments)    GI UPSET   Allevyn Adhesive [Wound Dressings]     Other reaction(s): Unknown   Jardiance  [Empagliflozin ] Other (See Comments)    weakness   Lactose Diarrhea and Other (See Comments)    Gas, bloating   Lactose Intolerance (Gi) Diarrhea    Gas, bloating   Adhesive [Tape] Other (See Comments)    SKIN IRRITATION AND BRUISING   Desipramine  Hcl Itching, Nausea Only and Other (See  Comments)    swimmy headed, ears itched   Hydromorphone  Itching   Nisoldipine Itching   Percocet [Oxycodone -Acetaminophen ] Itching    History of Present Illness    [name] has a PMH of chronic diastolic CHF, paroxysmal atrial fibrillation (status post watchman 4/24), CAD, type 2 diabetes, OSA on CPAP, HTN, HLD, nonalcoholic cirrhosis, diabetes, and morbid obesity.  She presented to the [facility] emergency department on 10/31/2023.  She reported progressive shortness of breath and lower extremity swelling.  She was seen and evaluated by cardiology in the ED.  She noted onset of her symptoms 2 days prior to admission.  She declined headache, chest pain, abdominal pain, fever and chills.  She was admitted and received IV diuresis.  Her dyspnea improved.  Her echocardiogram showed an LVEF of 55-60%.  She was discharged on furosemide  60 mg twice daily.  Lower extremity support stockings were recommended.  Fluid restriction was recommended.  Repeat BMP in 1 week and cardiology follow-up in 2 weeks.  She was instructed to weigh herself daily and maintain a weight log.  She presented to the clinic 11/14/23 for follow-up evaluation and preoperative cardiac evaluation.  She stated she drank a lot of fluid.  She enjoyed water  and Coke.  She had been drinking 2 bottles of Coke per day and also drinking water .  Her weight today was noted to be 218 pounds.  She continued to be in atrial fibrillation.  Initially her pulse was 101 and on recheck was 88.  We reviewed the importance of fluid restriction, low-sodium diet, and lower extremity support stockings.  She and her husband expressed understanding.  I gave her a weight log.  Her blood work showed stable renal function and electrolytes.  She noted that when she was in the hospital she did receive breathing treatments.  She requested a albuterol  inhaler.  I recommended that she have this filled by her PCP.  She had been somewhat physically active doing  physical therapy 2 times per week.  I recommended that she continue to do physical therapy exercises on her own.  I continued her  medication regimen and planned follow-up in 3 to 4 months.  She presents to the clinic today for follow-up evaluation and states***.  Today she denies chest pain, increased shortness of breath,fatigue, palpitations, melena, hematuria, hemoptysis, diaphoresis, weakness, presyncope, syncope, orthopnea, and PND.     Home Medications    Prior to Admission medications   Medication Sig Start Date End Date Taking? Authorizing Provider  Accu-Chek Softclix Lancets lancets USE TO CHECK BLOOD GLUCOSE UP TO 6 TIMES DAILY AS DIRECTED 02/13/23   McGowen, Aleene DEL, MD  acetaminophen  (TYLENOL ) 500 MG tablet Take 1,000 mg by mouth every 6 (six) hours as needed for mild pain (pain score 1-3).    [provider]  aspirin  EC 81 MG tablet Take 1 tablet (81 mg total) by mouth daily. Swallow whole. 07/23/23   McDaniel, Jill D, NP  blood glucose meter kit and supplies KIT Use up to six times daily as directed. DX. E11.9 01/05/20   McGowen, Aleene DEL, MD  canagliflozin  (INVOKANA ) 100 MG TABS tablet Take 1 tablet (100 mg total) by mouth daily before breakfast. 05/29/23   McGowen, Aleene DEL, MD  citalopram  (CELEXA ) 20 MG tablet Take 1 tablet (20 mg total) by mouth daily. 05/29/23   McGowen, Aleene DEL, MD  diphenoxylate -atropine  (LOMOTIL ) 2.5-0.025 MG tablet Take 1 tablet by mouth 4 (four) times daily as needed for diarrhea or loose stools. Patient not taking: Reported on 11/07/2023 12/10/22   Castaneda Mayorga, Toribio, MD  Dulaglutide  (TRULICITY ) 1.5 MG/0.5ML SOAJ INJECT 1.5 MG (0.5ML) UNDER THE SKIN ONCE A WEEK 11/07/23   McGowen, Aleene DEL, MD  esomeprazole  (NEXIUM ) 40 MG capsule Take 1 capsule (40 mg total) by mouth daily before breakfast. 09/25/23   Eartha Flavors, Toribio, MD  furosemide  (LASIX ) 20 MG tablet Take 3 tablets (60 mg total) by mouth 2 (two) times daily. 11/05/23 02/03/24  [place],  Camellia PARAS, DO  gabapentin  (NEURONTIN ) 600 MG tablet TAKE 1 AND 1/2 TABLETS BY MOUTH TWICE A DAY 09/30/23   McGowen, Aleene DEL, MD  glucose blood (ACCU-CHEK GUIDE) test strip USE TO CHECK BLOOD GLUCOSE UP TO 6 TIMES DAILY  AS DIRECTED 03/04/23   McGowen, Aleene DEL, MD  HYDROcodone -acetaminophen  (NORCO/VICODIN) 5-325 MG tablet 1-2 tabs po bid prn pain Patient not taking: Reported on 11/07/2023 09/30/23   McGowen, Philip H, MD  INFLIXIMAB  IV Inject into the vein every 8 (eight) weeks. Remicaid 5mg /kg Infusion every 8 weeks [facility]) Patient not taking: Reported on 11/07/2023    [provider]  Insulin  Pen Needle (B-D UF III MINI PEN NEEDLES) 31G X 5 MM MISC USE TO INJECT INSULINS EQUAL TO 6 TIMES DAILY. 03/04/23   McGowen, Aleene DEL, MD  insulin  regular human CONCENTRATED (HUMULIN  R U-500 KWIKPEN) 500 UNIT/ML KwikPen Inject 100 units into the skin at breakfast and lunch. 06/05/23   McGowen, Aleene DEL, MD  lactase (LACTAID) 3000 UNITS tablet Take 3,000 Units by mouth as needed (when eating foods containing dairy).     [provider]  meclizine  (ANTIVERT ) 25 MG tablet Take 1 tablet (25 mg total) by mouth 3 (three) times daily as needed for dizziness or nausea. Patient not taking: Reported on 11/07/2023 12/07/15   Geofm Glade PARAS, MD  metoprolol  succinate (TOPROL -XL) 100 MG 24 hr tablet TAKE 1 TABLET BY MOUTH 2 TIMES DAILY. TAKE WITH OR IMMEDIATELY FOLLOWING A MEAL 05/29/23   McGowen, Aleene DEL, MD  oxybutynin  (DITROPAN  XL) 5 MG 24 hr tablet Take 1 tablet (5 mg total) by mouth at bedtime. Patient not taking: Reported on 10/31/2023 08/08/23   McGowen, Philip H, MD  potassium chloride  (KLOR-CON  M10) 10 MEQ tablet Take 1 tablet (10 mEq total) by mouth daily. Patient taking differently: Take 20 mEq by mouth daily. 05/29/23   McGowen, Aleene DEL, MD  bromocriptine (PARLODEL) 2.5 MG tablet Take 2.5 mg by mouth 2 (two) times daily.  12/05/11  [provider]    Family History    Family History   Problem Relation Age of Onset   Diabetes Mother    Hypertension Mother    Heart attack Father        Mid 31's   Heart disease Father    Lung disease Father        spot on lung; had lung surgery   Alcohol abuse Other    Hypertension Son    Diabetes Son    Emphysema Maternal Grandfather    Asthma Maternal Grandfather    Colon cancer Paternal Grandfather    Stomach cancer Paternal Grandmother    Neuropathy Neg Hx    She indicated that her mother is deceased. She indicated that her father is deceased. She indicated that her maternal grandmother is deceased. She indicated that her maternal grandfather is deceased. She indicated that her paternal grandmother is deceased. She indicated that her paternal grandfather is deceased. She indicated that the status of her son is unknown. She indicated that the status of her neg hx is unknown. She indicated that the status of her other is unknown.  Social History    Social History   Socioeconomic History   Marital status: Married    Spouse name: Not on file   Number of children: Not on file   Years of education: Not on file   Highest education level: Not on file  Occupational History   Occupation: Retired  Tobacco Use   Smoking status: Never   Smokeless tobacco: Never  Vaping Use   Vaping status: Never Used  Substance and Sexual Activity   Alcohol use: Not Currently    Comment: ocassionally   Drug use: No   Sexual activity:  Not Currently    Partners: Male    Birth control/protection: Surgical    Comment: hysterectomy  Other Topics Concern   Not on file  Social History Narrative   She lives with husband in two-story home.  They have one grown son and 2 grandchildren.   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   Highest of level education:  Some college.   Never smoker.   Alcohol: rare.   Social Drivers of Corporate investment banker Strain: Low Risk  (01/21/2024)   Overall Financial Resource Strain (CARDIA)    Difficulty of Paying  Living Expenses: Not hard at all  Food Insecurity: No Food Insecurity (01/21/2024)   Hunger Vital Sign    Worried About Running Out of Food in the Last Year: Never true    Ran Out of Food in the Last Year: Never true  Transportation Needs: No Transportation Needs (01/21/2024)   PRAPARE - Administrator, Civil Service (Medical): No    Lack of Transportation (Non-Medical): No  Physical Activity: Inactive (01/21/2024)   Exercise Vital Sign    Days of Exercise per Week: 0 days    Minutes of Exercise per Session: 0 min  Stress: No Stress Concern Present (01/21/2024)   [employer] of Occupational Health - Occupational Stress Questionnaire    Feeling of Stress : Not at all  Social Connections: Moderately Isolated (01/21/2024)   Social Connection and Isolation Panel    Frequency of Communication with Friends and Family: Three times a week    Frequency of Social Gatherings with Friends and Family: Three times a week    Attends Religious Services: Never    Active Member of Clubs or Organizations: No    Attends Banker Meetings: Never    Marital Status: Married  Catering manager Violence: Not At Risk (11/07/2023)   Humiliation, Afraid, Rape, and Kick questionnaire    Fear of Current or Ex-Partner: No    Emotionally Abused: No    Physically Abused: No    Sexually Abused: No     Review of Systems    General:  No chills, fever, night sweats or weight changes.  Cardiovascular:  No chest pain, dyspnea on exertion, edema, orthopnea, palpitations, paroxysmal nocturnal dyspnea. Dermatological: No rash, lesions/masses Respiratory: No cough, dyspnea Urologic: No hematuria, dysuria Abdominal:   No nausea, vomiting, diarrhea, bright red blood per rectum, melena, or hematemesis Neurologic:  No visual changes, wkns, changes in mental status. All other systems reviewed and are otherwise negative except as noted above.  Physical Exam    VS:  There were no vitals taken for  this visit. , BMI There is no height or weight on file to calculate BMI. GEN: Well nourished, well developed, in no acute distress. HEENT: normal. Neck: Supple, no JVD, carotid bruits, or masses. Cardiac: RRR, no murmurs, rubs, or gallops. No clubbing, cyanosis, bilateral lower extremity 1+ slight pitting edema.  Radials/DP/PT 2+ and equal bilaterally.  Respiratory:  Respirations regular and unlabored, clear to auscultation bilaterally. GI: Soft, nontender, nondistended, BS + x 4. MS: no deformity or atrophy. Skin: warm and dry, no rash. Neuro:  Strength and sensation are intact. Psych: Normal affect.  Accessory Clinical Findings    Recent Labs: 11/05/2023: B Natriuretic Peptide 537.0 01/09/2024: Magnesium  1.8 03/24/2024: ALT 17; BUN 11; Creat 0.87; Hemoglobin 8.7; Platelets 183; Potassium 3.5; Sodium 138   Recent Lipid Panel    Component Value Date/Time   CHOL 149 05/06/2023 1450   TRIG  123 05/06/2023 1450   TRIG 196 01/26/2010 0000   HDL 38 (L) 05/06/2023 1450   CHOLHDL 3.9 05/06/2023 1450   CHOLHDL 5 08/28/2022 1019   VLDL 27.8 08/28/2022 1019   LDLCALC 89 05/06/2023 1450   LDLDIRECT 69.5 01/24/2011 1125    No BP recorded.  {Refresh Note OR Click here to enter BP  :1}***    ECG personally reviewed by me today-   ***  Echocardiogram 11/02/2023  IMPRESSIONS     1. Technically difficult study with poor echo windows, despite Definity   contrast   2. Left ventricular ejection fraction, by estimation, is 55 to 60%. The  left ventricle has normal function. Left ventricular endocardial border  not optimally defined to evaluate regional wall motion. Left ventricular  diastolic function could not be  evaluated.   3. Right ventricular systolic function was not well visualized. The right  ventricular size is not well visualized.   4. The mitral valve was not well visualized. Trivial mitral valve  regurgitation.   5. The aortic valve was not well visualized. Aortic valve  regurgitation  is not visualized.   Comparison(s): Changes from prior study are noted. 08/26/2023: LVEF  60-65%.   FINDINGS   Left Ventricle: Left ventricular ejection fraction, by estimation, is 55  to 60%. The left ventricle has normal function. Left ventricular  endocardial border not optimally defined to evaluate regional wall motion.  Definity  contrast agent was given IV to  delineate the left ventricular endocardial borders. The left ventricular  internal cavity size was normal in size. There is no left ventricular  hypertrophy. Left ventricular diastolic function could not be evaluated  due to atrial fibrillation. Left  ventricular diastolic function could not be evaluated.   Right Ventricle: The right ventricular size is not well visualized. Right  vetricular wall thickness was not well visualized. Right ventricular  systolic function was not well visualized.   Left Atrium: Left atrial size was normal in size.   Right Atrium: Right atrial size was normal in size.   Pericardium: There is no evidence of pericardial effusion.   Mitral Valve: The mitral valve was not well visualized. Trivial mitral  valve regurgitation.   Tricuspid Valve: The tricuspid valve is not well visualized. Tricuspid  valve regurgitation is not demonstrated.   Aortic Valve: The aortic valve was not well visualized. Aortic valve  regurgitation is not visualized.   Pulmonic Valve: The pulmonic valve was not well visualized. Pulmonic valve  regurgitation is not visualized.   Aorta: The aortic root and ascending aorta are structurally normal, with  no evidence of dilitation.   IAS/Shunts: The interatrial septum was not well visualized.       Assessment & Plan   1.  Chronic diastolic CHF-activity level***stable.  No increased fatigue or activity intolerance.  Reviewed importance of daily weights and fluid restriction (48-64 ounces daily) Continue furosemide  Heart healthy low-sodium  diet-reviewed Lower extremity support stockings-continue use Continue daily weights-contact office with a weight increase of 2 to 3 pounds overnight or 5 pounds in 1 week.-Reviewed  Coronary artery disease-denies anginal type symptoms.  Denies exertional chest discomfort. Continue aspirin  therapy Heart healthy low-sodium diet Maintain/increase physical activity  Atrial fibrillation-heart rate today 8***8.  Not a candidate for anticoagulation due to recurrent epistaxis.  Status post watchman 4/24. Continue metoprolol  Avoid triggers caffeine, chocolate, EtOH, dehydration etc.-reviewed   Disposition: Follow-up with Dr. Pietro or me in 6-9 months.   Josefa HERO. Lucresha Dismuke NP-C     04/05/2024,  2:52 PM  [facility] [address] Office [phone] Fax [phone]    I spent 15*** minutes examining this patient, reviewing medications, and using patient centered shared decision making involving their cardiac care.   I spent  20 minutes reviewing past medical history,  medications, and prior cardiac tests.

## 2024-04-05 NOTE — Telephone Encounter (Signed)
 Thanks Keyana, I discontinued the previous order and order Venofer

## 2024-04-06 ENCOUNTER — Other Ambulatory Visit: Payer: Self-pay | Admitting: Family Medicine

## 2024-04-06 NOTE — Telephone Encounter (Signed)
 I spoke with [name] at [facility], she says the patient is approved for the Tremfya Infusion, but they have not been able to reach the patient to schedule her. I called the patient and made her aware they have been trying to reach her to schedule her Tremfya infusions and gave her the phone number of [phone] to call and set up her infusions. Patient states understanding and will call to arrange her first infusion.

## 2024-04-06 NOTE — Telephone Encounter (Signed)
 Per Wanetta Barrack at [facility] she has sent the pharmacy team a message to get PA on Maintenance dosing of Tremfya. She will let me know if they are unable to proceed with the maintenance dosing Prior Auth.

## 2024-04-07 ENCOUNTER — Ambulatory Visit: Admitting: General Practice

## 2024-04-07 NOTE — Progress Notes (Deleted)
 Cardiology Clinic Note   Patient Name: [name] Date of Encounter: 04/07/2024  Primary Care Provider:  Candise Aleene DEL, MD Primary Cardiologist:  Redell Shallow, MD  Patient Profile    [name] 78 year old female presents the clinic today for follow-up evaluation of her chronic diastolic CHF.  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 ? to get liver bx if she gets bariatric surgery? Mild portal hypertensive gastropathy on EGD 08/2019.   Fibromyalgia    GAD (generalized anxiety disorder)    GERD    History of hiatal hernia    History of iron  deficiency anemia 12/2018   Inadequate absorption secondary to chronic/long term PPI therapy + portal hypertensive gastroduodenopathy. No GIB found on EGD, colonosc, and givens. Iron  infusions X multiple.   History of thrombocytopenia 12/2011   Hyperlipidemia    Intolerant of statins   HYPERTENSION    IBS (irritable bowel syndrome)    -D.  Good response to bentyl  and imodium  as of 06/2018 GI f/u.   IDDM (insulin  dependent diabetes mellitus)    with DPN (managed by Dr. Trixie but then in 2018 pt preferred to have me manage for her convenience)   Limited mobility    Requires a walker for arthritic pain, widespread musculoskeletal pain, and neuropathic pain.   Morbid obesity (HCC)    As of 11/2018, pt considering sleeve gastectomy vs bipass as of eval by Dr. Gael considering as of 12/2019.   Nonalcoholic steatohepatitis    Viral Hep screens NEG.  CT 2015.  Transaminasemia.  U/S 07/2018 showed early changes of cirrhosis.   OSA (obstructive sleep apnea) 09/14/2015   sleep study 09/07/15: severe obstructive sleep apnea with an AHI of 72 and SaO2 low of 75%.>referred to sleep MD   Osteoarthritis    hips,  shoulders, knees   PAF (paroxysmal atrial fibrillation) (HCC)    One documented episode (after getting EGD 2016).  Was on amiodarone  x 3 mo.  Rate control with metoprolol  + anticoag with xarelto . Watchman 12/2022, off anticoag   Portal hypertensive gastropathy (HCC)    hemorrhagic gastropathy + non-bleeding gastric ulcers on EGD 08/2023   Recurrent epistaxis    Granuloma in L nare cauterized by ENT 04/2020. Another cautery 06/2020   Small fiber neuropathy    Due to DM.  Symmetric hands and feet tingling/numbness.   Ulcerative colitis (HCC)    Remicade  infusion Q 8 weeks: in clinical and endoscopic remission as of 12/2018 GI f/u.  06/11/19 rpt colonoscopy->cecal and ascending colon colitis.   Past Surgical History:  Procedure Laterality Date   ABDOMINAL HYSTERECTOMY  1980   Paps no longer indicated.   BACK SURGERY     BACTERIAL OVERGROWTH TEST N/A 07/13/2015   Procedure: BACTERIAL OVERGROWTH TEST;  Surgeon: Claudis RAYMOND Rivet, MD;  [facility];  Service: Endoscopy;  Laterality: N/A;  730     BILATERAL SALPINGOOPHORECTOMY  02/10/2001   BIOPSY  06/11/2019   Procedure: BIOPSY;  Surgeon: Rivet Claudis RAYMOND, MD;  [facility]: [facility];  Service: Endoscopy;;  colon   BIOPSY  09/27/2019   Procedure: BIOPSY;  Surgeon: Rivet Claudis RAYMOND, MD;  [facility];  Service: Endoscopy;;  gastric duodenal   BIOPSY  09/15/2020  Procedure: BIOPSY;  Surgeon: Eartha Angelia Sieving, MD;  [facility]: [facility];  Service: Gastroenterology;;   BIOPSY  09/25/2023   Procedure: BIOPSY;  Surgeon: Eartha Angelia Sieving, MD;  [facility];  Service: Gastroenterology;;   BREAST REDUCTION SURGERY  1994   bilat   BREAST SURGERY     CARDIOVASCULAR STRESS TEST  07/2010   Lexiscan myoview: normal   CARPAL TUNNEL RELEASE Right 1996   CARPAL TUNNEL RELEASE Left 03/21/2003   CARPAL TUNNEL RELEASE Right 05/04/2013   Procedure: CARPAL TUNNEL RELEASE;  Surgeon: Lamar LULLA Leonor Mickey., MD;   Location: [facility];  Service: Orthopedics;  Laterality: Right;   CARPAL TUNNEL RELEASE Left 09/21/2013   Procedure: LEFT CARPAL TUNNEL RELEASE;  Surgeon: Lamar LULLA Leonor Mickey., MD;  Location: [facility];  Service: Orthopedics;  Laterality: Left;   CHOLECYSTECTOMY     COLONOSCOPY WITH PROPOFOL  N/A 08/04/2015   Colitis in remission.  No polyps.  Procedure: COLONOSCOPY WITH PROPOFOL ;  Surgeon: Claudis RAYMOND Rivet, MD;  [facility]: [facility];  Service: Endoscopy;  Laterality: N/A;  cecum time in  0820   time out  0827    total time 7 minutes   COLONOSCOPY WITH PROPOFOL  N/A 06/11/2019   cecal and ascending colon colitis.  Procedure: COLONOSCOPY WITH PROPOFOL ;  Surgeon: Rivet Claudis RAYMOND, MD;  [facility];  Service: Endoscopy;  Laterality: N/A;  730a   COLONOSCOPY WITH PROPOFOL  N/A 09/15/2020   Procedure: COLONOSCOPY WITH PROPOFOL ;  Surgeon: Eartha Angelia Sieving, MD;  [facility];  Service: Gastroenterology;  Laterality: N/A;  1030   ESOPHAGEAL DILATION N/A 08/04/2015   Procedure: ESOPHAGEAL DILATION;  Surgeon: Claudis RAYMOND Rivet, MD;  Location: [facility];  Service: Endoscopy;  Laterality: N/AMERL Agapito MOH, no mucousal disruption   ESOPHAGOGASTRODUODENOSCOPY  09/27/2019   Performed for IDA.  Esoph dilation was done but no stricture present.  Mild portal hypertensive gastropathy, o/w normal.  Duodenal bx NEG.  h pylori neg.   ESOPHAGOGASTRODUODENOSCOPY     09/25/2023 inactive chronic gastritis, h pylor neg. benign   ESOPHAGOGASTRODUODENOSCOPY     09/25/23 hemorrhagic portal gastropathy + nonbleeding gastric ulcers (H pylori neg)   ESOPHAGOGASTRODUODENOSCOPY (EGD) WITH ESOPHAGEAL DILATION  12/02/2005   ESOPHAGOGASTRODUODENOSCOPY (EGD) WITH PROPOFOL  N/A 08/04/2015   Procedure: ESOPHAGOGASTRODUODENOSCOPY (EGD) WITH PROPOFOL ;  Surgeon: Claudis RAYMOND Rivet, MD;  Location: [facility];  Service: Endoscopy;  Laterality: N/A;  procedure 1   ESOPHAGOGASTRODUODENOSCOPY (EGD)  WITH PROPOFOL  N/A 09/27/2019   Procedure: ESOPHAGOGASTRODUODENOSCOPY (EGD) WITH PROPOFOL ;  Surgeon: Rivet Claudis RAYMOND, MD;  [facility];  Service: Endoscopy;  Laterality: N/A;  12:10   ESOPHAGOGASTRODUODENOSCOPY (EGD) WITH PROPOFOL  N/A 10/31/2021   Procedure: ESOPHAGOGASTRODUODENOSCOPY (EGD) WITH PROPOFOL ;  Surgeon: Rivet Claudis RAYMOND, MD;  [facility];  Service: Endoscopy;  Laterality: N/A;  930   ESOPHAGOGASTRODUODENOSCOPY (EGD) WITH PROPOFOL  N/A 09/25/2023   Procedure: ESOPHAGOGASTRODUODENOSCOPY (EGD) WITH PROPOFOL ;  Surgeon: Eartha Angelia Sieving, MD;  [facility];  Service: Gastroenterology;  Laterality: N/A;  10:30AM;ASA 3   FLEXIBLE SIGMOIDOSCOPY  01/17/2012   Procedure: FLEXIBLE SIGMOIDOSCOPY;  Surgeon: Claudis RAYMOND Rivet, MD;  [facility];  Service: Endoscopy;  Laterality: N/A;   GIVENS CAPSULE STUDY N/A 08/03/2019   Procedure: GIVENS CAPSULE STUDY (performed for IDA)->some food debris in stomach and small amount of blood.  Surgeon: Rivet Claudis RAYMOND, MD;  [facility];  Service: Endoscopy;  Laterality: N/A;  730AM   HEMILAMINOTOMY LUMBAR  SPINE Bilateral 09/07/1999   L4-5   HOT HEMOSTASIS  09/25/2023   Procedure: HOT HEMOSTASIS (ARGON PLASMA COAGULATION/BICAP);  Surgeon: Eartha Flavors, Toribio, MD;  [facility];  Service: Gastroenterology;;   KNEE ARTHROSCOPY Right 01/1999; 10/2000   LEFT ATRIAL APPENDAGE OCCLUSION N/A 01/23/2023   Procedure: LEFT ATRIAL APPENDAGE OCCLUSION;  Surgeon: Wonda Sharper, MD;  Location: [facility] CV [facility];  Service: Cardiovascular;  Laterality: N/A;   LEFT ATRIAL APPENDAGE OCCLUSION     LYSIS OF ADHESION  02/10/2001   MALONEY DILATION  09/27/2019   Procedure: MALONEY DILATION;  Surgeon: Golda Claudis PENNER, MD;  [facility];  Service: Endoscopy;;   NASAL ENDOSCOPY WITH EPISTAXIS CONTROL Bilateral 01/25/2022   Procedure: NASAL ENDOSCOPY WITH EPISTAXIS CONTROL;  Surgeon: Mable Lenis, MD;  [facility];  Service: ENT;  Laterality: Bilateral;   NASAL SEPTOPLASTY W/ TURBINOPLASTY Bilateral 01/25/2022   Procedure: NASAL SEPTOPLASTY WITH TURBINATE REDUCTION;  Surgeon: Mable Lenis, MD;  Location: [facility] OR;  Service: ENT;  Laterality: Bilateral;   RECTOCELE REPAIR  1990; 09/12/2006   TARSAL TUNNEL RELEASE  2002   TEE WITHOUT CARDIOVERSION N/A 01/23/2023   Procedure: TRANSESOPHAGEAL ECHOCARDIOGRAM;  Surgeon: Wonda Sharper, MD;  Location: [facility] INVASIVE CV LAB;  Service: Cardiovascular;  Laterality: N/A;   TRANSESOPHAGEAL ECHOCARDIOGRAM (CATH LAB) N/A 08/26/2023   4mm leak around watchman device. Procedure: TRANSESOPHAGEAL ECHOCARDIOGRAM;  Surgeon: Loni Soyla LABOR, MD;  Location: [facility] INVASIVE CV LAB;  Service: Cardiovascular;  Laterality: N/A;   TRANSTHORACIC ECHOCARDIOGRAM  08/04/2015   EF 60-65%, normal wall motion, mild LVH, mild LA dilation, grd I DD.  11/2023 overall poor quality, EF 55-60%   TUMOR EXCISION Left 03/21/2003   dorsal 1st web space (hand)   URETEROLYSIS Right 02/10/2001    Allergies  Allergies  Allergen Reactions   Flagyl  [Metronidazole  Hcl] Other (See Comments)    DIAPHORESIS   Omeprazole  Anaphylaxis and Swelling    SWELLING OF TONGUE AND THROAT   Pioglitazone Other (See Comments) and Swelling    Weight gain, tongue swelling   Benzocaine-Menthol Swelling    SWELLING OF MOUTH   Metformin  And Related Diarrhea   Shrimp [Shellfish Allergy] Itching    OF THROAT AND EARS, if consumed raw   Statins Palpitations   Welchol [Colesevelam] Other (See Comments)    GI UPSET   Allevyn Adhesive [Wound Dressings]     Other reaction(s): Unknown   Jardiance  [Empagliflozin ] Other (See Comments)    weakness   Lactose Diarrhea and Other (See Comments)    Gas, bloating   Lactose Intolerance (Gi) Diarrhea    Gas, bloating   Adhesive [Tape] Other (See Comments)    SKIN IRRITATION AND BRUISING   Desipramine  Hcl Itching, Nausea Only and Other (See  Comments)    swimmy headed, ears itched   Hydromorphone  Itching   Nisoldipine Itching   Percocet [Oxycodone -Acetaminophen ] Itching    History of Present Illness    [name] has a PMH of chronic diastolic CHF, paroxysmal atrial fibrillation (status post watchman 4/24), CAD, type 2 diabetes, OSA on CPAP, HTN, HLD, nonalcoholic cirrhosis, diabetes, and morbid obesity.  She presented to the [facility] emergency department on 10/31/2023.  She reported progressive shortness of breath and lower extremity swelling.  She was seen and evaluated by cardiology in the ED.  She noted onset of her symptoms 2 days prior to admission.  She declined headache, chest pain, abdominal pain, fever and chills.  She was admitted and received IV diuresis.  Her dyspnea improved.  Her echocardiogram showed an LVEF of 55-60%.  She was discharged on furosemide  60 mg twice daily.  Lower extremity support stockings were recommended.  Fluid restriction was recommended.  Repeat BMP in 1 week and cardiology follow-up in 2 weeks.  She was instructed to weigh herself daily and maintain a weight log.  She presented to the clinic 11/14/23 for follow-up evaluation and preoperative cardiac evaluation.  She stated she drank a lot of fluid.  She enjoyed water  and Coke.  She had been drinking 2 bottles of Coke per day and also drinking water .  Her weight today was noted to be 218 pounds.  She continued to be in atrial fibrillation.  Initially her pulse was 101 and on recheck was 88.  We reviewed the importance of fluid restriction, low-sodium diet, and lower extremity support stockings.  She and her husband expressed understanding.  I gave her a weight log.  Her blood work showed stable renal function and electrolytes.  She noted that when she was in the hospital she did receive breathing treatments.  She requested a albuterol  inhaler.  I recommended that she have this filled by her PCP.  She had been somewhat physically active doing  physical therapy 2 times per week.  I recommended that she continue to do physical therapy exercises on her own.  I continued her  medication regimen and planned follow-up in 3 to 4 months.  She presents to the clinic today for follow-up evaluation and states***.  Today she denies chest pain, increased shortness of breath,fatigue, palpitations, melena, hematuria, hemoptysis, diaphoresis, weakness, presyncope, syncope, orthopnea, and PND.     Home Medications    Prior to Admission medications   Medication Sig Start Date End Date Taking? Authorizing Provider  Accu-Chek Softclix Lancets lancets USE TO CHECK BLOOD GLUCOSE UP TO 6 TIMES DAILY AS DIRECTED 02/13/23   McGowen, Aleene DEL, MD  acetaminophen  (TYLENOL ) 500 MG tablet Take 1,000 mg by mouth every 6 (six) hours as needed for mild pain (pain score 1-3).    [provider]  aspirin  EC 81 MG tablet Take 1 tablet (81 mg total) by mouth daily. Swallow whole. 07/23/23   McDaniel, Jill D, NP  blood glucose meter kit and supplies KIT Use up to six times daily as directed. DX. E11.9 01/05/20   McGowen, Aleene DEL, MD  canagliflozin  (INVOKANA ) 100 MG TABS tablet Take 1 tablet (100 mg total) by mouth daily before breakfast. 05/29/23   McGowen, Aleene DEL, MD  citalopram  (CELEXA ) 20 MG tablet Take 1 tablet (20 mg total) by mouth daily. 05/29/23   McGowen, Aleene DEL, MD  diphenoxylate -atropine  (LOMOTIL ) 2.5-0.025 MG tablet Take 1 tablet by mouth 4 (four) times daily as needed for diarrhea or loose stools. Patient not taking: Reported on 11/07/2023 12/10/22   Castaneda Mayorga, Toribio, MD  Dulaglutide  (TRULICITY ) 1.5 MG/0.5ML SOAJ INJECT 1.5 MG (0.5ML) UNDER THE SKIN ONCE A WEEK 11/07/23   McGowen, Aleene DEL, MD  esomeprazole  (NEXIUM ) 40 MG capsule Take 1 capsule (40 mg total) by mouth daily before breakfast. 09/25/23   Eartha Flavors, Toribio, MD  furosemide  (LASIX ) 20 MG tablet Take 3 tablets (60 mg total) by mouth 2 (two) times daily. 11/05/23 02/03/24  [place],  Camellia PARAS, DO  gabapentin  (NEURONTIN ) 600 MG tablet TAKE 1 AND 1/2 TABLETS BY MOUTH TWICE A DAY 09/30/23   McGowen, Aleene DEL, MD  glucose blood (ACCU-CHEK GUIDE) test strip USE TO CHECK BLOOD GLUCOSE UP TO 6 TIMES DAILY  AS DIRECTED 03/04/23   McGowen, Aleene DEL, MD  HYDROcodone -acetaminophen  (NORCO/VICODIN) 5-325 MG tablet 1-2 tabs po bid prn pain Patient not taking: Reported on 11/07/2023 09/30/23   McGowen, Philip H, MD  INFLIXIMAB  IV Inject into the vein every 8 (eight) weeks. Remicaid 5mg /kg Infusion every 8 weeks [facility]) Patient not taking: Reported on 11/07/2023    [provider]  Insulin  Pen Needle (B-D UF III MINI PEN NEEDLES) 31G X 5 MM MISC USE TO INJECT INSULINS EQUAL TO 6 TIMES DAILY. 03/04/23   McGowen, Aleene DEL, MD  insulin  regular human CONCENTRATED (HUMULIN  R U-500 KWIKPEN) 500 UNIT/ML KwikPen Inject 100 units into the skin at breakfast and lunch. 06/05/23   McGowen, Aleene DEL, MD  lactase (LACTAID) 3000 UNITS tablet Take 3,000 Units by mouth as needed (when eating foods containing dairy).     [provider]  meclizine  (ANTIVERT ) 25 MG tablet Take 1 tablet (25 mg total) by mouth 3 (three) times daily as needed for dizziness or nausea. Patient not taking: Reported on 11/07/2023 12/07/15   Geofm Glade PARAS, MD  metoprolol  succinate (TOPROL -XL) 100 MG 24 hr tablet TAKE 1 TABLET BY MOUTH 2 TIMES DAILY. TAKE WITH OR IMMEDIATELY FOLLOWING A MEAL 05/29/23   McGowen, Aleene DEL, MD  oxybutynin  (DITROPAN  XL) 5 MG 24 hr tablet Take 1 tablet (5 mg total) by mouth at bedtime. Patient not taking: Reported on 10/31/2023 08/08/23   McGowen, Philip H, MD  potassium chloride  (KLOR-CON  M10) 10 MEQ tablet Take 1 tablet (10 mEq total) by mouth daily. Patient taking differently: Take 20 mEq by mouth daily. 05/29/23   McGowen, Aleene DEL, MD  bromocriptine (PARLODEL) 2.5 MG tablet Take 2.5 mg by mouth 2 (two) times daily.  12/05/11  [provider]    Family History    Family History   Problem Relation Age of Onset   Diabetes Mother    Hypertension Mother    Heart attack Father        Mid 45's   Heart disease Father    Lung disease Father        spot on lung; had lung surgery   Alcohol abuse Other    Hypertension Son    Diabetes Son    Emphysema Maternal Grandfather    Asthma Maternal Grandfather    Colon cancer Paternal Grandfather    Stomach cancer Paternal Grandmother    Neuropathy Neg Hx    She indicated that her mother is deceased. She indicated that her father is deceased. She indicated that her maternal grandmother is deceased. She indicated that her maternal grandfather is deceased. She indicated that her paternal grandmother is deceased. She indicated that her paternal grandfather is deceased. She indicated that the status of her son is unknown. She indicated that the status of her neg hx is unknown. She indicated that the status of her other is unknown.  Social History    Social History   Socioeconomic History   Marital status: Married    Spouse name: Not on file   Number of children: Not on file   Years of education: Not on file   Highest education level: Not on file  Occupational History   Occupation: Retired  Tobacco Use   Smoking status: Never   Smokeless tobacco: Never  Vaping Use   Vaping status: Never Used  Substance and Sexual Activity   Alcohol use: Not Currently    Comment: ocassionally   Drug use: No   Sexual activity:  Not Currently    Partners: Male    Birth control/protection: Surgical    Comment: hysterectomy  Other Topics Concern   Not on file  Social History Narrative   She lives with husband in two-story home.  They have one grown son and 2 grandchildren.   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   Highest of level education:  Some college.   Never smoker.   Alcohol: rare.   Social Drivers of Corporate investment banker Strain: Low Risk  (01/21/2024)   Overall Financial Resource Strain (CARDIA)    Difficulty of Paying  Living Expenses: Not hard at all  Food Insecurity: No Food Insecurity (01/21/2024)   Hunger Vital Sign    Worried About Running Out of Food in the Last Year: Never true    Ran Out of Food in the Last Year: Never true  Transportation Needs: No Transportation Needs (01/21/2024)   PRAPARE - Administrator, Civil Service (Medical): No    Lack of Transportation (Non-Medical): No  Physical Activity: Inactive (01/21/2024)   Exercise Vital Sign    Days of Exercise per Week: 0 days    Minutes of Exercise per Session: 0 min  Stress: No Stress Concern Present (01/21/2024)   [employer] of Occupational Health - Occupational Stress Questionnaire    Feeling of Stress : Not at all  Social Connections: Moderately Isolated (01/21/2024)   Social Connection and Isolation Panel    Frequency of Communication with Friends and Family: Three times a week    Frequency of Social Gatherings with Friends and Family: Three times a week    Attends Religious Services: Never    Active Member of Clubs or Organizations: No    Attends Banker Meetings: Never    Marital Status: Married  Catering manager Violence: Not At Risk (11/07/2023)   Humiliation, Afraid, Rape, and Kick questionnaire    Fear of Current or Ex-Partner: No    Emotionally Abused: No    Physically Abused: No    Sexually Abused: No     Review of Systems    General:  No chills, fever, night sweats or weight changes.  Cardiovascular:  No chest pain, dyspnea on exertion, edema, orthopnea, palpitations, paroxysmal nocturnal dyspnea. Dermatological: No rash, lesions/masses Respiratory: No cough, dyspnea Urologic: No hematuria, dysuria Abdominal:   No nausea, vomiting, diarrhea, bright red blood per rectum, melena, or hematemesis Neurologic:  No visual changes, wkns, changes in mental status. All other systems reviewed and are otherwise negative except as noted above.  Physical Exam    VS:  There were no vitals taken for  this visit. , BMI There is no height or weight on file to calculate BMI. GEN: Well nourished, well developed, in no acute distress. HEENT: normal. Neck: Supple, no JVD, carotid bruits, or masses. Cardiac: RRR, no murmurs, rubs, or gallops. No clubbing, cyanosis, bilateral lower extremity 1+ slight pitting edema.  Radials/DP/PT 2+ and equal bilaterally.  Respiratory:  Respirations regular and unlabored, clear to auscultation bilaterally. GI: Soft, nontender, nondistended, BS + x 4. MS: no deformity or atrophy. Skin: warm and dry, no rash. Neuro:  Strength and sensation are intact. Psych: Normal affect.  Accessory Clinical Findings    Recent Labs: 11/05/2023: B Natriuretic Peptide 537.0 01/09/2024: Magnesium  1.8 03/24/2024: ALT 17; BUN 11; Creat 0.87; Hemoglobin 8.7; Platelets 183; Potassium 3.5; Sodium 138   Recent Lipid Panel    Component Value Date/Time   CHOL 149 05/06/2023 1450   TRIG  123 05/06/2023 1450   TRIG 196 01/26/2010 0000   HDL 38 (L) 05/06/2023 1450   CHOLHDL 3.9 05/06/2023 1450   CHOLHDL 5 08/28/2022 1019   VLDL 27.8 08/28/2022 1019   LDLCALC 89 05/06/2023 1450   LDLDIRECT 69.5 01/24/2011 1125    No BP recorded.  {Refresh Note OR Click here to enter BP  :1}***    ECG personally reviewed by me today-   ***  Echocardiogram 11/02/2023  IMPRESSIONS     1. Technically difficult study with poor echo windows, despite Definity   contrast   2. Left ventricular ejection fraction, by estimation, is 55 to 60%. The  left ventricle has normal function. Left ventricular endocardial border  not optimally defined to evaluate regional wall motion. Left ventricular  diastolic function could not be  evaluated.   3. Right ventricular systolic function was not well visualized. The right  ventricular size is not well visualized.   4. The mitral valve was not well visualized. Trivial mitral valve  regurgitation.   5. The aortic valve was not well visualized. Aortic valve  regurgitation  is not visualized.   Comparison(s): Changes from prior study are noted. 08/26/2023: LVEF  60-65%.   FINDINGS   Left Ventricle: Left ventricular ejection fraction, by estimation, is 55  to 60%. The left ventricle has normal function. Left ventricular  endocardial border not optimally defined to evaluate regional wall motion.  Definity  contrast agent was given IV to  delineate the left ventricular endocardial borders. The left ventricular  internal cavity size was normal in size. There is no left ventricular  hypertrophy. Left ventricular diastolic function could not be evaluated  due to atrial fibrillation. Left  ventricular diastolic function could not be evaluated.   Right Ventricle: The right ventricular size is not well visualized. Right  vetricular wall thickness was not well visualized. Right ventricular  systolic function was not well visualized.   Left Atrium: Left atrial size was normal in size.   Right Atrium: Right atrial size was normal in size.   Pericardium: There is no evidence of pericardial effusion.   Mitral Valve: The mitral valve was not well visualized. Trivial mitral  valve regurgitation.   Tricuspid Valve: The tricuspid valve is not well visualized. Tricuspid  valve regurgitation is not demonstrated.   Aortic Valve: The aortic valve was not well visualized. Aortic valve  regurgitation is not visualized.   Pulmonic Valve: The pulmonic valve was not well visualized. Pulmonic valve  regurgitation is not visualized.   Aorta: The aortic root and ascending aorta are structurally normal, with  no evidence of dilitation.   IAS/Shunts: The interatrial septum was not well visualized.       Assessment & Plan   1.  Chronic diastolic CHF-activity level***stable.  No increased fatigue or activity intolerance.  Reviewed importance of daily weights and fluid restriction (48-64 ounces daily) Continue furosemide  Heart healthy low-sodium  diet-reviewed Lower extremity support stockings-continue use Continue daily weights-contact office with a weight increase of 2 to 3 pounds overnight or 5 pounds in 1 week.-Reviewed  Coronary artery disease-denies anginal type symptoms.  Denies exertional chest discomfort. Continue aspirin  therapy Heart healthy low-sodium diet Maintain/increase physical activity  Atrial fibrillation-heart rate today 8***8.  Not a candidate for anticoagulation due to recurrent epistaxis.  Status post watchman 4/24. Continue metoprolol  Avoid triggers caffeine, chocolate, EtOH, dehydration etc.-reviewed   Disposition: Follow-up with Dr. Pietro or me in 6-9 months.   Josefa HERO. Sabella Traore NP-C     04/07/2024,  2:11 PM [facility] [address] Office [phone] Fax [phone]    I spent 15*** minutes examining this patient, reviewing medications, and using patient centered shared decision making involving their cardiac care.   I spent  20 minutes reviewing past medical history,  medications, and prior cardiac tests.

## 2024-04-09 ENCOUNTER — Ambulatory Visit: Admitting: General Practice

## 2024-04-12 NOTE — Telephone Encounter (Signed)
 Please start her on a dose of 200 mg every 4 weeks starting at week 12. Please also send her an order for repeat CBC and CMP 4 weeks after first dose.  Can have these drawn on the day of her second infusion.  If so, please ask the infusion clinic to draw that at that time. Thanks

## 2024-04-12 NOTE — Telephone Encounter (Signed)
 repeat CBC and CMP 4 weeks after first dose.  Can have these drawn on the day of her second infusion around 05/13/2024. Place order and let the patient and infusion center know.

## 2024-04-12 NOTE — Telephone Encounter (Signed)
 Tremfya  Infusion - Week 0 (first infusion)  scheduled for 04/15/2024 at 11 am.                  Week 4 due around 05/13/2024                 Week 8 due around 06/10/2024

## 2024-04-12 NOTE — Telephone Encounter (Signed)
 Patient with UC, when do we start the maintenance for her ? Per my records the patient can either start at 12 weeks at 200 mg and every four weeks there after or 100 mg at week 16, then every 8 weeks there after. Please advise.

## 2024-04-12 NOTE — Telephone Encounter (Signed)
 Around 07/08/2024 will be due for the first maintenance dose.

## 2024-04-14 ENCOUNTER — Encounter: Attending: Gastroenterology | Admitting: Emergency Medicine

## 2024-04-14 ENCOUNTER — Telehealth: Payer: Self-pay

## 2024-04-14 ENCOUNTER — Other Ambulatory Visit (HOSPITAL_COMMUNITY): Payer: Self-pay

## 2024-04-14 VITALS — BP 129/59 | HR 53 | Temp 98.0°F | Resp 17

## 2024-04-14 DIAGNOSIS — D500 Iron deficiency anemia secondary to blood loss (chronic): Secondary | ICD-10-CM

## 2024-04-14 DIAGNOSIS — D509 Iron deficiency anemia, unspecified: Secondary | ICD-10-CM

## 2024-04-14 MED ORDER — IRON SUCROSE 20 MG/ML IV SOLN
200.0000 mg | Freq: Once | INTRAVENOUS | Status: AC
  Administered 2024-04-14: 200 mg via INTRAVENOUS

## 2024-04-14 MED ORDER — DIPHENHYDRAMINE HCL 25 MG PO CAPS
25.0000 mg | ORAL_CAPSULE | Freq: Once | ORAL | Status: AC
  Administered 2024-04-14: 25 mg via ORAL

## 2024-04-14 MED ORDER — ACETAMINOPHEN 325 MG PO TABS
650.0000 mg | ORAL_TABLET | Freq: Once | ORAL | Status: AC
  Administered 2024-04-14: 650 mg via ORAL

## 2024-04-14 NOTE — Patient Instructions (Signed)
[id number] 

## 2024-04-14 NOTE — Telephone Encounter (Signed)
 Pharmacy Patient Advocate Encounter     Per test claim: The current 28 day co-pay is, $100.00.  No PA needed at this time. This test claim was processed through [facility]- copay amounts may vary at other pharmacies due to pharmacy/plan contracts, or as the patient moves through the different stages of their insurance plan.

## 2024-04-14 NOTE — Progress Notes (Signed)
 Diagnosis: Iron  Deficiency Anemia  Provider:  Eartha Sieving MD  Procedure: IV Push  IV Type: Peripheral, IV Location: R Hand  Venofer  (Iron  Sucrose), Dose: 200 mg  Post Infusion IV Care: Observation period completed  Discharge: Condition: Good, Destination: Home . AVS Provided  Performed by:  Vernell JINNY Fitting, RN

## 2024-04-15 ENCOUNTER — Encounter (INDEPENDENT_AMBULATORY_CARE_PROVIDER_SITE_OTHER): Admitting: Emergency Medicine

## 2024-04-15 VITALS — BP 116/57 | HR 74 | Temp 97.6°F | Resp 18

## 2024-04-15 DIAGNOSIS — K51011 Ulcerative (chronic) pancolitis with rectal bleeding: Secondary | ICD-10-CM | POA: Diagnosis not present

## 2024-04-15 MED ORDER — SODIUM CHLORIDE 0.9 % IV SOLN
200.0000 mg | Freq: Once | INTRAVENOUS | Status: AC
  Administered 2024-04-15: 200 mg via INTRAVENOUS
  Filled 2024-04-15: qty 20

## 2024-04-15 NOTE — Progress Notes (Signed)
 Diagnosis: Ulcerative Colitis  Provider:  Eartha Sieving MD  Procedure: IV Infusion  IV Type: Peripheral, IV Location: R Hand  Tremfya , Dose: 200 mg  Infusion Start Time: 1100  Infusion Stop Time: 1205  Post Infusion IV Care: Observation period completed and Peripheral IV Discontinued  Discharge: Condition: Good, Destination: Home . AVS Provided  Performed by:  Delon ONEIDA Officer, RN

## 2024-04-16 ENCOUNTER — Encounter (INDEPENDENT_AMBULATORY_CARE_PROVIDER_SITE_OTHER): Admitting: Emergency Medicine

## 2024-04-16 ENCOUNTER — Other Ambulatory Visit (INDEPENDENT_AMBULATORY_CARE_PROVIDER_SITE_OTHER): Payer: Self-pay

## 2024-04-16 VITALS — BP 129/63 | HR 93 | Temp 98.4°F | Resp 17

## 2024-04-16 DIAGNOSIS — K219 Gastro-esophageal reflux disease without esophagitis: Secondary | ICD-10-CM

## 2024-04-16 DIAGNOSIS — D500 Iron deficiency anemia secondary to blood loss (chronic): Secondary | ICD-10-CM

## 2024-04-16 DIAGNOSIS — I10 Essential (primary) hypertension: Secondary | ICD-10-CM

## 2024-04-16 DIAGNOSIS — K922 Gastrointestinal hemorrhage, unspecified: Secondary | ICD-10-CM

## 2024-04-16 DIAGNOSIS — R7989 Other specified abnormal findings of blood chemistry: Secondary | ICD-10-CM

## 2024-04-16 DIAGNOSIS — D509 Iron deficiency anemia, unspecified: Secondary | ICD-10-CM

## 2024-04-16 DIAGNOSIS — R195 Other fecal abnormalities: Secondary | ICD-10-CM

## 2024-04-16 DIAGNOSIS — R1310 Dysphagia, unspecified: Secondary | ICD-10-CM

## 2024-04-16 DIAGNOSIS — K51011 Ulcerative (chronic) pancolitis with rectal bleeding: Secondary | ICD-10-CM

## 2024-04-16 MED ORDER — ACETAMINOPHEN 325 MG PO TABS
650.0000 mg | ORAL_TABLET | Freq: Once | ORAL | Status: AC
  Administered 2024-04-16: 650 mg via ORAL

## 2024-04-16 MED ORDER — IRON SUCROSE 20 MG/ML IV SOLN
200.0000 mg | Freq: Once | INTRAVENOUS | Status: AC
  Administered 2024-04-16: 200 mg via INTRAVENOUS

## 2024-04-16 MED ORDER — DIPHENHYDRAMINE HCL 25 MG PO CAPS
25.0000 mg | ORAL_CAPSULE | Freq: Once | ORAL | Status: AC
  Administered 2024-04-16: 25 mg via ORAL

## 2024-04-16 NOTE — Telephone Encounter (Signed)
 Message below sent to [facility]. Darice Eagles and Marcey Na are aware to have the cbc, and Cmp drawn on this patient when she sees them on 05/12/2024 prior to the second Tremfya  infusion.   This patient comes over to the Infusion clinic for her Tremfya , her next dose looks like it is due on 05/12/2024. Would yall mind please having a cbc and a cmp drawn on her prior to that scheduled infusion per Dr. Eartha? I have placed the orders into Epic and ordered through [facility] as to not put too much hardship on yall. Please advise.   Per Darice Eagles, Rn she will make note to have this done on 05/12/2024 when the patient is there for her 2nd Tremyfa infusion.

## 2024-04-16 NOTE — Progress Notes (Signed)
 Diagnosis: Iron  Deficiency Anemia  Provider:  Eartha Sieving MD  Procedure: IV Push  IV Type: Peripheral, IV Location: R Hand  Venofer  (Iron  Sucrose), Dose: 200 mg  Post Infusion IV Care: Observation period completed and Peripheral IV Discontinued  Discharge: Condition: Good, Destination: Home . AVS Declined  Performed by:  Delon ONEIDA Officer, RN

## 2024-04-16 NOTE — Telephone Encounter (Signed)
 Patient is scheduled per [facility]  Week 0- 04/15/2024 Week 4- 05/12/2024- will have CBC, CMP done prior to this infusion. Darice Eagles, Rn aware at [facility]- infusion clinic. Week 8- 06/09/2024 Week 12- 07/07/2024 due for maintenance 200 mg Sq Q 4 weeks,per Dr. Eartha.

## 2024-04-19 ENCOUNTER — Encounter: Admitting: Emergency Medicine

## 2024-04-19 VITALS — BP 152/69 | HR 53 | Temp 98.0°F | Resp 17

## 2024-04-19 DIAGNOSIS — D509 Iron deficiency anemia, unspecified: Secondary | ICD-10-CM | POA: Diagnosis not present

## 2024-04-19 DIAGNOSIS — D500 Iron deficiency anemia secondary to blood loss (chronic): Secondary | ICD-10-CM

## 2024-04-19 MED ORDER — ACETAMINOPHEN 325 MG PO TABS
650.0000 mg | ORAL_TABLET | Freq: Once | ORAL | Status: AC
  Administered 2024-04-19: 650 mg via ORAL

## 2024-04-19 MED ORDER — ACETAMINOPHEN 325 MG PO TABS: 650.0000 mg | ORAL_TABLET | Freq: Once | ORAL | Status: DC

## 2024-04-19 MED ORDER — DIPHENHYDRAMINE HCL 25 MG PO CAPS: 25.0000 mg | ORAL_CAPSULE | Freq: Once | ORAL | Status: DC

## 2024-04-19 MED ORDER — IRON SUCROSE 20 MG/ML IV SOLN
200.0000 mg | Freq: Once | INTRAVENOUS | Status: AC
  Administered 2024-04-19: 200 mg via INTRAVENOUS

## 2024-04-19 MED ORDER — DIPHENHYDRAMINE HCL 25 MG PO CAPS
25.0000 mg | ORAL_CAPSULE | Freq: Once | ORAL | Status: AC
  Administered 2024-04-19: 25 mg via ORAL

## 2024-04-19 NOTE — Progress Notes (Signed)
 Diagnosis: Iron  Deficiency Anemia  Provider:  Eartha Sieving MD  Procedure: IV Push  IV Type: Peripheral, IV Location: R Hand  Venofer  (Iron  Sucrose), Dose: 200 mg  Post Infusion IV Care: Observation period completed and Peripheral IV Discontinued  Discharge: Condition: Good, Destination: Home . AVS Declined  Performed by:  Delon ONEIDA Officer, RN

## 2024-04-21 ENCOUNTER — Other Ambulatory Visit (HOSPITAL_COMMUNITY): Payer: Self-pay | Admitting: Gastroenterology

## 2024-04-21 ENCOUNTER — Ambulatory Visit

## 2024-04-21 MED ORDER — TREMFYA PEN 200 MG/2ML ~~LOC~~ SOAJ
200.0000 mg | SUBCUTANEOUS | 12 refills | Status: DC
  Filled 2024-07-01: qty 2, fill #0

## 2024-04-22 ENCOUNTER — Encounter: Admitting: Emergency Medicine

## 2024-04-22 VITALS — BP 139/91 | HR 87 | Temp 98.4°F | Resp 17

## 2024-04-22 DIAGNOSIS — D509 Iron deficiency anemia, unspecified: Secondary | ICD-10-CM | POA: Diagnosis not present

## 2024-04-22 DIAGNOSIS — D500 Iron deficiency anemia secondary to blood loss (chronic): Secondary | ICD-10-CM

## 2024-04-22 MED ORDER — ACETAMINOPHEN 325 MG PO TABS
650.0000 mg | ORAL_TABLET | Freq: Once | ORAL | Status: AC
  Administered 2024-04-22: 650 mg via ORAL

## 2024-04-22 MED ORDER — DIPHENHYDRAMINE HCL 25 MG PO CAPS
25.0000 mg | ORAL_CAPSULE | Freq: Once | ORAL | Status: AC
  Administered 2024-04-22: 25 mg via ORAL

## 2024-04-22 MED ORDER — IRON SUCROSE 20 MG/ML IV SOLN
200.0000 mg | Freq: Once | INTRAVENOUS | Status: AC
  Administered 2024-04-22: 200 mg via INTRAVENOUS

## 2024-04-22 NOTE — Progress Notes (Signed)
 Diagnosis: Iron  Deficiency Anemia  Provider:  Eartha Sieving MD  Procedure: IV Push  IV Type: Peripheral, IV Location: R Hand  Venofer  (Iron  Sucrose), Dose: 200 mg  Post Infusion IV Care: Observation period completed and Peripheral IV Discontinued  Discharge: Condition: Good, Destination: Home . AVS Declined  Performed by:  Delon ONEIDA Officer, RN

## 2024-04-23 ENCOUNTER — Encounter: Payer: Self-pay | Admitting: Gastroenterology

## 2024-04-23 ENCOUNTER — Other Ambulatory Visit: Payer: Self-pay

## 2024-04-23 ENCOUNTER — Telehealth: Payer: Self-pay

## 2024-04-23 NOTE — Telephone Encounter (Signed)
 Reason for CRM: [name] is calling to check on the status of forms that was faxed on 07/21 in regards to a pump and full leg compression wraps.   No phone number listed to call [name] back or the name of company she is calling from 04/23/24

## 2024-04-27 ENCOUNTER — Encounter (INDEPENDENT_AMBULATORY_CARE_PROVIDER_SITE_OTHER): Admitting: Emergency Medicine

## 2024-04-27 VITALS — BP 187/73 | HR 64 | Temp 98.5°F | Resp 16

## 2024-04-27 DIAGNOSIS — D509 Iron deficiency anemia, unspecified: Secondary | ICD-10-CM

## 2024-04-27 DIAGNOSIS — D500 Iron deficiency anemia secondary to blood loss (chronic): Secondary | ICD-10-CM

## 2024-04-27 MED ORDER — IRON SUCROSE 20 MG/ML IV SOLN
200.0000 mg | Freq: Once | INTRAVENOUS | Status: AC
  Administered 2024-04-27: 200 mg via INTRAVENOUS

## 2024-04-27 MED ORDER — ACETAMINOPHEN 325 MG PO TABS
650.0000 mg | ORAL_TABLET | Freq: Once | ORAL | Status: AC
  Administered 2024-04-27: 650 mg via ORAL

## 2024-04-27 MED ORDER — DIPHENHYDRAMINE HCL 25 MG PO CAPS
25.0000 mg | ORAL_CAPSULE | Freq: Once | ORAL | Status: AC
  Administered 2024-04-27: 25 mg via ORAL

## 2024-04-27 NOTE — Progress Notes (Signed)
 Diagnosis: Iron  Deficiency Anemia  Provider:  Maree Isles MD  Procedure: IV Push  IV Type: Peripheral, IV Location: L Hand  Venofer  (Iron  Sucrose), Dose: 200 mg  Post Infusion IV Care: Observation period completed and Peripheral IV Discontinued  Discharge: Condition: Good, Destination: Home . AVS Declined  Performed by:  Delon ONEIDA Officer, RN

## 2024-05-02 ENCOUNTER — Other Ambulatory Visit: Payer: Self-pay | Admitting: Family Medicine

## 2024-05-03 ENCOUNTER — Other Ambulatory Visit: Payer: Self-pay | Admitting: Family Medicine

## 2024-05-03 ENCOUNTER — Ambulatory Visit: Admitting: Family Medicine

## 2024-05-03 ENCOUNTER — Encounter: Payer: Self-pay | Admitting: Family Medicine

## 2024-05-03 VITALS — BP 139/75 | HR 78 | Temp 99.1°F | Ht 62.0 in | Wt 225.8 lb

## 2024-05-03 DIAGNOSIS — I5032 Chronic diastolic (congestive) heart failure: Secondary | ICD-10-CM | POA: Diagnosis not present

## 2024-05-03 DIAGNOSIS — Z794 Long term (current) use of insulin: Secondary | ICD-10-CM

## 2024-05-03 DIAGNOSIS — I890 Lymphedema, not elsewhere classified: Secondary | ICD-10-CM

## 2024-05-03 DIAGNOSIS — I872 Venous insufficiency (chronic) (peripheral): Secondary | ICD-10-CM

## 2024-05-03 DIAGNOSIS — D508 Other iron deficiency anemias: Secondary | ICD-10-CM | POA: Diagnosis not present

## 2024-05-03 DIAGNOSIS — E1140 Type 2 diabetes mellitus with diabetic neuropathy, unspecified: Secondary | ICD-10-CM | POA: Diagnosis not present

## 2024-05-03 LAB — POCT GLYCOSYLATED HEMOGLOBIN (HGB A1C)
HbA1c POC (<> result, manual entry): 6.8 % (ref 4.0–5.6)
HbA1c, POC (controlled diabetic range): 6.8 % (ref 0.0–7.0)
HbA1c, POC (prediabetic range): 6.8 % — AB (ref 5.7–6.4)
Hemoglobin A1C: 6.8 % — AB (ref 4.0–5.6)

## 2024-05-03 MED ORDER — TRULICITY 1.5 MG/0.5ML ~~LOC~~ SOAJ: SUBCUTANEOUS | 3 refills | Status: DC

## 2024-05-03 MED ORDER — METOPROLOL SUCCINATE ER 100 MG PO TB24: ORAL_TABLET | ORAL | 1 refills | Status: DC

## 2024-05-03 MED ORDER — BD PEN NEEDLE MINI U/F 31G X 5 MM MISC: 6 refills | Status: DC

## 2024-05-03 MED ORDER — FUROSEMIDE 20 MG PO TABS: ORAL_TABLET | ORAL | Status: DC

## 2024-05-03 MED ORDER — FREESTYLE LIBRE 3 PLUS SENSOR MISC: 3 refills | Status: DC

## 2024-05-03 MED ORDER — CITALOPRAM HYDROBROMIDE 20 MG PO TABS: 20.0000 mg | ORAL_TABLET | Freq: Every day | ORAL | 1 refills | Status: DC

## 2024-05-03 MED ORDER — OXYBUTYNIN CHLORIDE ER 10 MG PO TB24: 10.0000 mg | ORAL_TABLET | Freq: Every day | ORAL | 1 refills | Status: DC

## 2024-05-03 MED ORDER — SPIRONOLACTONE 25 MG PO TABS: 25.0000 mg | ORAL_TABLET | Freq: Every day | ORAL | 1 refills | Status: DC

## 2024-05-03 NOTE — Patient Instructions (Addendum)
 Increase your lispro insulin  at breakfast to 115 units. Continue 100 units of lispro insulin  at lunch. Decrease your suppertime lispro to 75 units  Increase morning lasix  dose to 80mg . Continue lasix  60mg  every evening.

## 2024-05-03 NOTE — Progress Notes (Signed)
 OFFICE VISIT  05/03/2024  CC:  Chief Complaint  Patient presents with   Medical Management of Chronic Issues    3 month f/u   Patient is a 78 y.o. female who presents accompanied by her husband [name] for 18-month follow-up diabetes, chronic iron  deficiency anemia, and chronic diastolic heart failure. 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 She is in the midst of switching over from regular insulin  to basal bolus regimen. She is getting some assistance through [facility] and she will change over when she gets the Tresiba .  She will start 15 units of this and she will start using Humalog  as per sliding scale outlined last visit by Whitney:    Next hemoglobin A1c would be after 02/15/2024.  INTERIM HX: Glucoses consistently up into the 200s or higher around midday.  She eats breakfast around 10 AM. She often has a hypoglycemic episode around midnight or shortly thereafter. The plan to get Toujeo  did not work out due to cost/insurance issues. Therefore, she has been taking 100 units of Humalog  3 times a day. Not using a sliding scale.   03/24/2024, Dr. Marchia follow-up: Tremfya  started, prednisone  taper rx'd for active UC.  Ultrasound liver ordered.  She states she no longer has diarrhea.  Labs 03/24/2024 that day showed stable hemoglobin at 8.7, iron  significantly low.  Dr. Eartha ordered iron  infusion, was done 7/29.    Still with significant urge incontinence but says oxybutynin  XL 5 mg does help some.  She requests an increased dose.  She has gained 14 pounds in the last 3 months.  She states that she feels like this is due to caloric excess rather than fluid retention. She takes Lasix  60 mg twice a day. She avoids adding salt to her food but otherwise does not restrict sodium.  She does not restrict fluid intake. Her lower legs are swollen significantly but she had been getting them wrapped daily for a while and since stopping this the  persistent swelling has returned.  She denies any shortness of breath or chest pain or palpitations. Walking does not make her short of breath. At her baseline her activity is limited to walking with a cane, walks slowly.    She gradually continues to have memory loss and cognitive impairment.  Review of systems: No fever, no abdominal pain, no nausea or vomiting or diarrhea. No rash.  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 ? to get liver bx if she gets bariatric surgery? Mild portal hypertensive gastropathy on EGD 08/2019.   Fibromyalgia    GAD (generalized anxiety disorder)    GERD    History of hiatal hernia    History of iron  deficiency anemia 12/2018   Inadequate absorption secondary to chronic/long term PPI therapy + portal hypertensive gastroduodenopathy. No GIB found on EGD, colonosc, and givens. Iron  infusions X multiple.   History of thrombocytopenia 12/2011   Hyperlipidemia    Intolerant of statins   HYPERTENSION    IBS (irritable bowel syndrome)    -D.  Good response to bentyl  and imodium  as of 06/2018 GI f/u.   IDDM (insulin  dependent diabetes mellitus)    with DPN (managed by Dr. Trixie but then in 2018 pt preferred to have me manage for her convenience)  Limited mobility    Requires a walker for arthritic pain, widespread musculoskeletal pain, and neuropathic pain.   Morbid obesity (HCC)    As of 11/2018, pt considering sleeve gastectomy vs bipass as of eval by Dr. Gael considering as of 12/2019.   Nonalcoholic steatohepatitis    Viral Hep screens NEG.  CT 2015.  Transaminasemia.  U/S 07/2018 showed early changes of cirrhosis.   OSA (obstructive sleep apnea) 09/14/2015   sleep study 09/07/15: severe obstructive sleep apnea with an AHI of 72 and  SaO2 low of 75%.>referred to sleep MD   Osteoarthritis    hips, shoulders, knees   PAF (paroxysmal atrial fibrillation) (HCC)    One documented episode (after getting EGD 2016).  Was on amiodarone  x 3 mo.  Rate control with metoprolol  + anticoag with xarelto . Watchman 12/2022, off anticoag   Portal hypertensive gastropathy (HCC)    hemorrhagic gastropathy + non-bleeding gastric ulcers on EGD 08/2023   Recurrent epistaxis    Granuloma in L nare cauterized by ENT 04/2020. Another cautery 06/2020   Small fiber neuropathy    Due to DM.  Symmetric hands and feet tingling/numbness.   Ulcerative colitis (HCC)    Remicade  infusion Q 8 weeks: in clinical and endoscopic remission as of 12/2018 GI f/u.  06/11/19 rpt colonoscopy->cecal and ascending colon colitis.    Past Surgical History:  Procedure Laterality Date   ABDOMINAL HYSTERECTOMY  1980   Paps no longer indicated.   BACK SURGERY     BACTERIAL OVERGROWTH TEST N/A 07/13/2015   Procedure: BACTERIAL OVERGROWTH TEST;  Surgeon: Claudis RAYMOND Rivet, MD;  [facility];  Service: Endoscopy;  Laterality: N/A;  730     BILATERAL SALPINGOOPHORECTOMY  02/10/2001   BIOPSY  06/11/2019   Procedure: BIOPSY;  Surgeon: Rivet Claudis RAYMOND, MD;  [facility]: [facility];  Service: Endoscopy;;  colon   BIOPSY  09/27/2019   Procedure: BIOPSY;  Surgeon: Rivet Claudis RAYMOND, MD;  [facility];  Service: Endoscopy;;  gastric duodenal   BIOPSY  09/15/2020   Procedure: BIOPSY;  Surgeon: Eartha Angelia Sieving, MD;  [facility];  Service: Gastroenterology;;   BIOPSY  09/25/2023   Procedure: BIOPSY;  Surgeon: Eartha Angelia Sieving, MD;  [facility];  Service: Gastroenterology;;   BREAST REDUCTION SURGERY  1994   bilat   BREAST SURGERY     CARDIOVASCULAR STRESS TEST  07/2010   Lexiscan myoview: normal   CARPAL TUNNEL RELEASE Right 1996   CARPAL TUNNEL RELEASE Left 03/21/2003   CARPAL TUNNEL RELEASE Right 05/04/2013    Procedure: CARPAL TUNNEL RELEASE;  Surgeon: Lamar LULLA Leonor Mickey., MD;  Location: [facility];  Service: Orthopedics;  Laterality: Right;   CARPAL TUNNEL RELEASE Left 09/21/2013   Procedure: LEFT CARPAL TUNNEL RELEASE;  Surgeon: Lamar LULLA Leonor Mickey., MD;  Location:  SURGERY CENTER;  Service: Orthopedics;  Laterality: Left;   CHOLECYSTECTOMY     COLONOSCOPY WITH PROPOFOL  N/A 08/04/2015   Colitis in remission.  No polyps.  Procedure: COLONOSCOPY WITH PROPOFOL ;  Surgeon: Claudis RAYMOND Rivet, MD;  Location: [facility];  Service: Endoscopy;  Laterality: N/A;  cecum time in  0820   time out  0827    total time 7 minutes   COLONOSCOPY WITH PROPOFOL  N/A 06/11/2019   cecal and ascending colon colitis.  Procedure: COLONOSCOPY WITH PROPOFOL ;  Surgeon: Rivet Claudis RAYMOND, MD;  [facility];  Service: Endoscopy;  Laterality: N/A;  269j  COLONOSCOPY WITH PROPOFOL  N/A 09/15/2020   Procedure: COLONOSCOPY WITH PROPOFOL ;  Surgeon: Eartha Angelia Sieving, MD;  [facility];  Service: Gastroenterology;  Laterality: N/A;  1030   ESOPHAGEAL DILATION N/A 08/04/2015   Procedure: ESOPHAGEAL DILATION;  Surgeon: Claudis RAYMOND Rivet, MD;  Location: [facility];  Service: Endoscopy;  Laterality: N/AMERL Agapito MOH, no mucousal disruption   ESOPHAGOGASTRODUODENOSCOPY  09/27/2019   Performed for IDA.  Esoph dilation was done but no stricture present.  Mild portal hypertensive gastropathy, o/w normal.  Duodenal bx NEG.  h pylori neg.   ESOPHAGOGASTRODUODENOSCOPY     09/25/2023 inactive chronic gastritis, h pylor neg. benign   ESOPHAGOGASTRODUODENOSCOPY     09/25/23 hemorrhagic portal gastropathy + nonbleeding gastric ulcers (H pylori neg)   ESOPHAGOGASTRODUODENOSCOPY (EGD) WITH ESOPHAGEAL DILATION  12/02/2005   ESOPHAGOGASTRODUODENOSCOPY (EGD) WITH PROPOFOL  N/A 08/04/2015   Procedure: ESOPHAGOGASTRODUODENOSCOPY (EGD) WITH PROPOFOL ;  Surgeon: Claudis RAYMOND Rivet, MD;  Location: [facility];  Service: Endoscopy;   Laterality: N/A;  procedure 1   ESOPHAGOGASTRODUODENOSCOPY (EGD) WITH PROPOFOL  N/A 09/27/2019   Procedure: ESOPHAGOGASTRODUODENOSCOPY (EGD) WITH PROPOFOL ;  Surgeon: Rivet Claudis RAYMOND, MD;  [facility];  Service: Endoscopy;  Laterality: N/A;  12:10   ESOPHAGOGASTRODUODENOSCOPY (EGD) WITH PROPOFOL  N/A 10/31/2021   Procedure: ESOPHAGOGASTRODUODENOSCOPY (EGD) WITH PROPOFOL ;  Surgeon: Rivet Claudis RAYMOND, MD;  [facility];  Service: Endoscopy;  Laterality: N/A;  930   ESOPHAGOGASTRODUODENOSCOPY (EGD) WITH PROPOFOL  N/A 09/25/2023   Procedure: ESOPHAGOGASTRODUODENOSCOPY (EGD) WITH PROPOFOL ;  Surgeon: Eartha Angelia Sieving, MD;  [facility];  Service: Gastroenterology;  Laterality: N/A;  10:30AM;ASA 3   FLEXIBLE SIGMOIDOSCOPY  01/17/2012   Procedure: FLEXIBLE SIGMOIDOSCOPY;  Surgeon: Claudis RAYMOND Rivet, MD;  [facility];  Service: Endoscopy;  Laterality: N/A;   GIVENS CAPSULE STUDY N/A 08/03/2019   Procedure: GIVENS CAPSULE STUDY (performed for IDA)->some food debris in stomach and small amount of blood.  Surgeon: Rivet Claudis RAYMOND, MD;  [facility];  Service: Endoscopy;  Laterality: N/A;  730AM   HEMILAMINOTOMY LUMBAR SPINE Bilateral 09/07/1999   L4-5   HOT HEMOSTASIS  09/25/2023   Procedure: HOT HEMOSTASIS (ARGON PLASMA COAGULATION/BICAP);  Surgeon: Eartha Angelia, Sieving, MD;  [facility];  Service: Gastroenterology;;   KNEE ARTHROSCOPY Right 01/1999; 10/2000   LEFT ATRIAL APPENDAGE OCCLUSION N/A 01/23/2023   Procedure: LEFT ATRIAL APPENDAGE OCCLUSION;  Surgeon: Wonda Sharper, MD;  [facility] INVASIVE CV LAB;  Service: Cardiovascular;  Laterality: N/A;   LEFT ATRIAL APPENDAGE OCCLUSION     LYSIS OF ADHESION  02/10/2001   MALONEY DILATION  09/27/2019   Procedure: MALONEY DILATION;  Surgeon: Rivet Claudis RAYMOND, MD;  [facility];  Service: Endoscopy;;   NASAL ENDOSCOPY WITH EPISTAXIS CONTROL Bilateral 01/25/2022    Procedure: NASAL ENDOSCOPY WITH EPISTAXIS CONTROL;  Surgeon: Mable Lenis, MD;  Location: [facility] OR;  Service: ENT;  Laterality: Bilateral;   NASAL SEPTOPLASTY W/ TURBINOPLASTY Bilateral 01/25/2022   Procedure: NASAL SEPTOPLASTY WITH TURBINATE REDUCTION;  Surgeon: Mable Lenis, MD;  Location: [facility] OR;  Service: ENT;  Laterality: Bilateral;   RECTOCELE REPAIR  1990; 09/12/2006   TARSAL TUNNEL RELEASE  2002   TEE WITHOUT CARDIOVERSION N/A 01/23/2023   Procedure: TRANSESOPHAGEAL ECHOCARDIOGRAM;  Surgeon: Wonda Sharper, MD;  Location: [facility] INVASIVE CV LAB;  Service: Cardiovascular;  Laterality: N/A;   TRANSESOPHAGEAL ECHOCARDIOGRAM (CATH LAB) N/A 08/26/2023   4mm leak around watchman device. Procedure: TRANSESOPHAGEAL ECHOCARDIOGRAM;  Surgeon: Loni Soyla LABOR, MD;  Location: [facility]  INVASIVE CV LAB;  Service: Cardiovascular;  Laterality: N/A;   TRANSTHORACIC ECHOCARDIOGRAM  08/04/2015   EF 60-65%, normal wall motion, mild LVH, mild LA dilation, grd I DD.  11/2023 overall poor quality, EF 55-60%   TUMOR EXCISION Left 03/21/2003   dorsal 1st web space (hand)   URETEROLYSIS Right 02/10/2001    Outpatient Medications Prior to Visit  Medication Sig Dispense Refill   acetaminophen  (TYLENOL ) 500 MG tablet Take 1,000 mg by mouth every 6 (six) hours as needed for mild pain (pain score 1-3).     albuterol  (VENTOLIN  HFA) 108 (90 Base) MCG/ACT inhaler Inhale 2 puffs into the lungs every 6 (six) hours as needed for wheezing or shortness of breath. 8 g 0   aspirin  EC 81 MG tablet Take 1 tablet (81 mg total) by mouth daily. Swallow whole.     Continuous Glucose Receiver (FREESTYLE LIBRE 3 READER) DEVI Use as directed to check blood sugars 1 each 11   Continuous Glucose Sensor (FREESTYLE LIBRE 3 SENSOR) MISC 1 each by Does not apply route every 14 (fourteen) days. Please apply for 14 days and then switch to new sensor 2 each 3   dapagliflozin  propanediol (FARXIGA ) 5 MG TABS tablet Take 1 tablet (5 mg total) by  mouth daily. 30 tablet 1   esomeprazole  (NEXIUM ) 40 MG capsule Take 1 capsule (40 mg total) by mouth daily before breakfast. 90 capsule 3   gabapentin  (NEURONTIN ) 600 MG tablet TAKE 1 AND 1/2 TABLETS BY MOUTH TWICE A DAY (Patient taking differently: Take 500 mg by mouth daily at 6 (six) AM.) 270 tablet 1   [START ON 07/08/2024] Guselkumab  (TREMFYA  PEN) 200 MG/2ML SOAJ Inject 200 mg into the skin every 28 (twenty-eight) days. Starting 4 weeks after the last Tremfya  infusion which will be around June 10, 2024 2 mL 12   insulin  lispro (HUMALOG  KWIKPEN) 100 UNIT/ML KwikPen Inject per sliding scale three times daily, as needed, not to exceed 60 units daily 3 mL 5   lactase (LACTAID) 3000 UNITS tablet Take 3,000 Units by mouth as needed (when eating foods containing dairy).      meclizine  (ANTIVERT ) 25 MG tablet Take 1 tablet (25 mg total) by mouth 3 (three) times daily as needed for dizziness or nausea. 90 tablet 2   potassium chloride  (KLOR-CON  M) 10 MEQ tablet TAKE 1 TABLET BY MOUTH EVERY DAY 90 tablet 1   furosemide  (LASIX ) 20 MG tablet Take 3 tablets (60 mg total) by mouth 2 (two) times daily. 540 tablet 0   oxybutynin  (DITROPAN -XL) 5 MG 24 hr tablet TAKE 1 TABLET BY MOUTH EVERYDAY AT BEDTIME 30 tablet 0   Accu-Chek Softclix Lancets lancets USE TO CHECK BLOOD GLUCOSE UP TO 6 TIMES DAILY AS DIRECTED (Patient not taking: Reported on 05/03/2024) 200 each 5   blood glucose meter kit and supplies KIT Use up to six times daily as directed. DX. E11.9 (Patient not taking: Reported on 05/03/2024) 1 each 0   diphenoxylate -atropine  (LOMOTIL ) 2.5-0.025 MG tablet Take 1 tablet by mouth 4 (four) times daily as needed for diarrhea or loose stools. (Patient not taking: Reported on 05/03/2024) 60 tablet 2   glucose blood (ACCU-CHEK GUIDE TEST) test strip USE TO CHECK BLOOD GLUCOSE UP TO 6 TIMES DAILY. (Patient not taking: Reported on 05/03/2024) 500 strip 1   HYDROcodone -acetaminophen  (NORCO/VICODIN) 5-325 MG tablet 1 tab  po bid prn pain (Patient not taking: Reported on 05/03/2024) 60 tablet 0   citalopram  (CELEXA ) 20 MG tablet TAKE 1  TABLET BY MOUTH EVERY DAY 30 tablet 0   Dulaglutide  (TRULICITY ) 1.5 MG/0.5ML SOAJ INJECT 1.5 MG (0.5ML) UNDER THE SKIN ONCE A WEEK 18 mL 0   INFLIXIMAB  IV Inject into the vein every 8 (eight) weeks. Remicaid 5mg /kg Infusion every 8 weeks [facility]) (Patient not taking: Reported on 05/03/2024)     insulin  degludec (TRESIBA  FLEXTOUCH) 100 UNIT/ML FlexTouch Pen Inject 15 Units into the skin at bedtime. (Patient not taking: Reported on 05/03/2024) 3 mL 5   Insulin  Pen Needle (B-D UF III MINI PEN NEEDLES) 31G X 5 MM MISC USE TO INJECT INSULINS EQUAL TO 6 TIMES DAILY. 500 each 6   metoprolol  succinate (TOPROL -XL) 100 MG 24 hr tablet TAKE 1 TABLET BY MOUTH 2 TIMES DAILY. TAKE WITH OR IMMEDIATELY FOLLOWING A MEAL 60 tablet 0   predniSONE  (DELTASONE ) 10 MG tablet Take 4 tablets (40 mg total) by mouth daily with breakfast for 7 days, THEN 3 tablets (30 mg total) daily with breakfast for 7 days, THEN 2 tablets (20 mg total) daily with breakfast for 7 days, THEN 1 tablet (10 mg total) daily with breakfast for 7 days. (Patient not taking: No sig reported) 70 tablet 0   spironolactone  (ALDACTONE ) 25 MG tablet Take 1 tablet (25 mg total) by mouth daily. 30 tablet 0   Facility-Administered Medications Prior to Visit  Medication Dose Route Frequency Provider Last Rate Last Admin   acetaminophen  (TYLENOL ) tablet 650 mg  650 mg Oral Once Eartha Flavors, Toribio, MD       diphenhydrAMINE  (BENADRYL ) capsule 25 mg  25 mg Oral Once Eartha Flavors Toribio, MD        Allergies  Allergen Reactions   Flagyl  [Metronidazole  Hcl] Other (See Comments)    DIAPHORESIS   Omeprazole  Anaphylaxis and Swelling    SWELLING OF TONGUE AND THROAT   Pioglitazone Other (See Comments) and Swelling    Weight gain, tongue swelling   Benzocaine-Menthol Swelling    SWELLING OF MOUTH   Metformin  And Related  Diarrhea   Shrimp [Shellfish Allergy] Itching    OF THROAT AND EARS, if consumed raw   Statins Palpitations   Welchol [Colesevelam] Other (See Comments)    GI UPSET   Allevyn Adhesive [Wound Dressings]     Other reaction(s): Unknown   Jardiance  [Empagliflozin ] Other (See Comments)    weakness   Lactose Diarrhea and Other (See Comments)    Gas, bloating   Lactose Intolerance (Gi) Diarrhea    Gas, bloating   Adhesive [Tape] Other (See Comments)    SKIN IRRITATION AND BRUISING   Desipramine  Hcl Itching, Nausea Only and Other (See Comments)    swimmy headed, ears itched   Hydromorphone  Itching   Nisoldipine Itching   Percocet [Oxycodone -Acetaminophen ] Itching    Review of Systems As per HPI  PE:    05/03/2024    1:02 PM 04/27/2024    1:50 PM 04/27/2024    1:07 PM  Vitals with BMI  Height 5' 2    Weight 225 lbs 13 oz    BMI 41.29    Systolic 139 187 836  Diastolic 75 73 71  Pulse 78 64 60   Physical Exam  General: Alert, well appearing. Oriented x 4. Cardiovascular: Irregularly irregular, rate approximately 80, No murmur. Lungs bibasilar soft inspiratory crackles.  Good aeration.  Nonlabored respirations. Extremities: 4+ bilateral lower extremity pitting edema. No signs of active dermatitis or cellulitis.  LABS:  Last CBC Lab Results  Component Value Date  WBC 4.7 03/24/2024   HGB 8.7 (L) 03/24/2024   HCT 29.4 (L) 03/24/2024   MCV 86.7 03/24/2024   MCH 25.7 (L) 03/24/2024   RDW 17.3 (H) 03/24/2024   PLT 183 03/24/2024   Lab Results  Component Value Date   IRON  30 (L) 03/24/2024   TIBC 319 03/24/2024   FERRITIN 7 (L) 03/24/2024   Last metabolic panel Lab Results  Component Value Date   GLUCOSE 282 (H) 03/24/2024   NA 138 03/24/2024   K 3.5 03/24/2024   CL 96 (L) 03/24/2024   CO2 34 (H) 03/24/2024   BUN 11 03/24/2024   CREATININE 0.87 03/24/2024   EGFR 69 03/24/2024   CALCIUM  8.0 (L) 03/24/2024   PROT 7.0 03/24/2024   ALBUMIN 2.9 (L) 09/19/2023    LABGLOB 3.4 08/17/2015   AGRATIO 1.0 08/17/2015   BILITOT 0.6 03/24/2024   ALKPHOS 63 09/19/2023   AST 40 (H) 03/24/2024   ALT 17 03/24/2024   ANIONGAP 7 11/05/2023   Last lipids Lab Results  Component Value Date   CHOL 149 05/06/2023   HDL 38 (L) 05/06/2023   LDLCALC 89 05/06/2023   LDLDIRECT 69.5 01/24/2011   TRIG 123 05/06/2023   CHOLHDL 3.9 05/06/2023   Last hemoglobin A1c Lab Results  Component Value Date   HGBA1C 6.8 (A) 05/03/2024   HGBA1C 6.8 05/03/2024   HGBA1C 6.8 (A) 05/03/2024   HGBA1C 6.8 05/03/2024   Last thyroid  functions Lab Results  Component Value Date   TSH 0.618 10/09/2022   Last vitamin B12 and Folate Lab Results  Component Value Date   VITAMINB12 469 08/22/2023   FOLATE 16.0 08/22/2023   IMPRESSION AND PLAN:  #1 type 2 diabetes, insulin -requiring, with diabetic neuropathy. Hemoglobin A1c is 6.8% today.  However, we will check this against a fructosamine level. The accuracy of her A1c in the setting of chronic iron  deficiency anemia and repetitive iron  infusions is questionable. She has very erratic glucose pattern/wide excursions. It seems that basal/bolus regimen has not been attainable due to insurance/cost barriers. Will ask our clinical pharmacology team to look into this again. In the meantime we will increase her morning Humalog  to 115 units, continue with midday Humalog  to 100 units, and decrease suppertime Humalog  to 75 units.   Will continue Trulicity  1.5 mg weekly and Farxiga  5 mg a day.  #2 chronic diastolic heart failure. I do think she is somewhat volume overloaded.  Fortunately she is asymptomatic from a pulmonary standpoint. Will increase Lasix  to 80 mg in the morning and continue 60 mg in the evening. Emphasized the importance of restricting sodium and fluids the best she can.  #3 chronic iron  deficiency anemia. She is getting iron  infusions through GI/rheumatology. The best I can tell from review of her chart it looks  like the last infusion was just 5 days ago.  An After Visit Summary was printed and given to the patient.  Spent 47 min with pt today reviewing HPI, reviewing relevant past history, doing exam, reviewing and discussing lab and imaging data, and formulating plans.  FOLLOW UP: Return in about 3 weeks (around 05/24/2024) for routine chronic illness f/u.  Signed:  Gerlene Hockey, MD           05/03/2024

## 2024-05-06 ENCOUNTER — Ambulatory Visit: Payer: Self-pay | Admitting: Family Medicine

## 2024-05-06 ENCOUNTER — Other Ambulatory Visit (INDEPENDENT_AMBULATORY_CARE_PROVIDER_SITE_OTHER): Payer: Self-pay | Admitting: Gastroenterology

## 2024-05-06 DIAGNOSIS — K51919 Ulcerative colitis, unspecified with unspecified complications: Secondary | ICD-10-CM

## 2024-05-06 LAB — FRUCTOSAMINE: Fructosamine: 326 umol/L — ABNORMAL HIGH (ref 205–285)

## 2024-05-07 NOTE — Telephone Encounter (Signed)
 Week 12- 07/07/2024 due for maintenance 200 mg Sq Q 4 weeks,per Dr. Eartha.

## 2024-05-08 DIAGNOSIS — I890 Lymphedema, not elsewhere classified: Secondary | ICD-10-CM | POA: Diagnosis not present

## 2024-05-12 ENCOUNTER — Telehealth (INDEPENDENT_AMBULATORY_CARE_PROVIDER_SITE_OTHER): Payer: Self-pay | Admitting: *Deleted

## 2024-05-12 ENCOUNTER — Encounter: Attending: Gastroenterology | Admitting: Internal Medicine

## 2024-05-12 ENCOUNTER — Other Ambulatory Visit: Payer: Self-pay | Admitting: Emergency Medicine

## 2024-05-12 VITALS — BP 144/79 | HR 86 | Temp 97.9°F | Resp 16

## 2024-05-12 DIAGNOSIS — K51011 Ulcerative (chronic) pancolitis with rectal bleeding: Secondary | ICD-10-CM

## 2024-05-12 DIAGNOSIS — D509 Iron deficiency anemia, unspecified: Secondary | ICD-10-CM

## 2024-05-12 MED ORDER — SODIUM CHLORIDE 0.9 % IV SOLN
200.0000 mg | Freq: Once | INTRAVENOUS | Status: AC
  Administered 2024-05-12 (×2): 200 mg via INTRAVENOUS
  Filled 2024-05-12: qty 20

## 2024-05-12 NOTE — Progress Notes (Signed)
 Diagnosis: Ulcerative Colitis  Provider:  Eartha Sieving MD  Procedure: IV Infusion  IV Type: Peripheral, IV Location: L Hand  Tremfya , Dose: 200 mg  Infusion Start Time: 1323  Infusion Stop Time: 1427  Post Infusion IV Care: Observation period completed  Discharge: Condition: Good, Destination: Home . AVS Provided  Performed by:  Blanca Selinda SAUNDERS, LPN

## 2024-05-12 NOTE — Telephone Encounter (Signed)
 Patient is on recall for 5 yr TCS in September, she had one in 08/2020, please advise is she is due now - thanks

## 2024-05-13 LAB — COMPREHENSIVE METABOLIC PANEL WITH GFR
AG Ratio: 0.9 (calc) — ABNORMAL LOW (ref 1.0–2.5)
ALT: 17 U/L (ref 6–29)
AST: 26 U/L (ref 10–35)
Albumin: 3.2 g/dL — ABNORMAL LOW (ref 3.6–5.1)
Alkaline phosphatase (APISO): 72 U/L (ref 37–153)
BUN/Creatinine Ratio: 13 (calc) (ref 6–22)
BUN: 13 mg/dL (ref 7–25)
CO2: 32 mmol/L (ref 20–32)
Calcium: 8.8 mg/dL (ref 8.6–10.4)
Chloride: 101 mmol/L (ref 98–110)
Creat: 1.03 mg/dL — ABNORMAL HIGH (ref 0.60–1.00)
Globulin: 3.6 g/dL (ref 1.9–3.7)
Glucose, Bld: 256 mg/dL — ABNORMAL HIGH (ref 65–99)
Potassium: 3.5 mmol/L (ref 3.5–5.3)
Sodium: 139 mmol/L (ref 135–146)
Total Bilirubin: 0.5 mg/dL (ref 0.2–1.2)
Total Protein: 6.8 g/dL (ref 6.1–8.1)
eGFR: 56 mL/min/1.73m2 — ABNORMAL LOW (ref >=60)

## 2024-05-13 LAB — CBC WITH DIFFERENTIAL/PLATELET
Absolute Lymphocytes: 1350 {cells}/uL (ref 850–3900)
Absolute Monocytes: 263 {cells}/uL (ref 200–950)
Basophils Absolute: 20 {cells}/uL (ref 0–200)
Basophils Relative: 0.7 %
Eosinophils Absolute: 70 {cells}/uL (ref 15–500)
Eosinophils Relative: 2.5 %
HCT: 33.9 % — ABNORMAL LOW (ref 35.0–45.0)
Hemoglobin: 10.6 g/dL — ABNORMAL LOW (ref 11.7–15.5)
MCH: 29.9 pg (ref 27.0–33.0)
MCHC: 31.3 g/dL — ABNORMAL LOW (ref 32.0–36.0)
MCV: 95.5 fL (ref 80.0–100.0)
MPV: 10 fL (ref 7.5–12.5)
Monocytes Relative: 9.4 %
Neutro Abs: 1098 {cells}/uL — ABNORMAL LOW (ref 1500–7800)
Neutrophils Relative %: 39.2 %
Platelets: 124 Thousand/uL — ABNORMAL LOW (ref 140–400)
RBC: 3.55 Million/uL — ABNORMAL LOW (ref 3.80–5.10)
RDW: 20 % — ABNORMAL HIGH (ref 11.0–15.0)
Total Lymphocyte: 48.2 %
WBC: 2.8 Thousand/uL — ABNORMAL LOW (ref 3.8–10.8)

## 2024-05-17 ENCOUNTER — Encounter (INDEPENDENT_AMBULATORY_CARE_PROVIDER_SITE_OTHER): Payer: Self-pay

## 2024-05-17 ENCOUNTER — Ambulatory Visit: Payer: Self-pay | Admitting: Gastroenterology

## 2024-05-17 DIAGNOSIS — D509 Iron deficiency anemia, unspecified: Secondary | ICD-10-CM

## 2024-05-17 DIAGNOSIS — R7989 Other specified abnormal findings of blood chemistry: Secondary | ICD-10-CM

## 2024-05-17 NOTE — Telephone Encounter (Signed)
 Please move her recall for first week of December Thanks

## 2024-05-18 ENCOUNTER — Ambulatory Visit: Payer: Self-pay

## 2024-05-18 NOTE — Telephone Encounter (Signed)
 1st attempt---Not able to leave a voicemail         Patient Is having issues with urine constantly going to restroom and most of the time she is unable to make it to the restroom

## 2024-05-18 NOTE — Telephone Encounter (Signed)
**Note De-identified  [place] Obfuscation** Please advise 

## 2024-05-18 NOTE — Telephone Encounter (Signed)
 Recall moved to Dec 2025

## 2024-05-18 NOTE — Telephone Encounter (Signed)
 Patient states that she talked to her PCP about this at her last visit--she wears Depends and wets them before she can make it to the bathroom. Started back in April.---Oxybutynin  was increased at her last appointment---gotten worse in the past week Patient was just seen 05/03/2024 and wanted her PCP's advice on her urinary concerns because they havent gotten any better No appointment available in the next 24 hours  Patient is advised Urgent Care is an alternative and if anything gets worse to go to the Emergency Room Patient verbalized understanding   FYI Only or Action Required?: Action required by provider: request for appointment, clinical question for provider, and update on patient condition.  Patient was last seen in primary care on 05/03/2024 by McGowen, Aleene DEL, MD.  Called Nurse Triage reporting Urinary Symptoms.  Symptoms began in April -- worse in the past week.  Interventions attempted: Prescription medications: normally prescribed medications.  Symptoms are: gradually worsening.  Triage Disposition: See Physician Within 24 Hours  Patient/caregiver understands and will follow disposition?: No, wishes to speak with PCP            Reason for Disposition  [1] Can't control passage of urine (i.e., urinary incontinence) AND [2] new-onset (< 2 weeks) or worsening  Protocols used: Urinary Symptoms-A-AH

## 2024-05-20 ENCOUNTER — Other Ambulatory Visit: Payer: Self-pay | Admitting: Urgent Care

## 2024-05-20 DIAGNOSIS — E1140 Type 2 diabetes mellitus with diabetic neuropathy, unspecified: Secondary | ICD-10-CM

## 2024-05-22 ENCOUNTER — Other Ambulatory Visit: Payer: Self-pay | Admitting: Family Medicine

## 2024-05-26 ENCOUNTER — Encounter: Payer: Self-pay | Admitting: Family Medicine

## 2024-05-26 ENCOUNTER — Ambulatory Visit (INDEPENDENT_AMBULATORY_CARE_PROVIDER_SITE_OTHER): Admitting: Family Medicine

## 2024-05-26 VITALS — BP 134/84 | HR 114 | Temp 98.4°F | Ht 62.0 in | Wt 222.0 lb

## 2024-05-26 DIAGNOSIS — N3001 Acute cystitis with hematuria: Secondary | ICD-10-CM | POA: Diagnosis not present

## 2024-05-26 DIAGNOSIS — E119 Type 2 diabetes mellitus without complications: Secondary | ICD-10-CM

## 2024-05-26 DIAGNOSIS — I5032 Chronic diastolic (congestive) heart failure: Secondary | ICD-10-CM

## 2024-05-26 DIAGNOSIS — E1142 Type 2 diabetes mellitus with diabetic polyneuropathy: Secondary | ICD-10-CM

## 2024-05-26 DIAGNOSIS — R32 Unspecified urinary incontinence: Secondary | ICD-10-CM | POA: Diagnosis not present

## 2024-05-26 DIAGNOSIS — E1140 Type 2 diabetes mellitus with diabetic neuropathy, unspecified: Secondary | ICD-10-CM

## 2024-05-26 DIAGNOSIS — E118 Type 2 diabetes mellitus with unspecified complications: Secondary | ICD-10-CM

## 2024-05-26 DIAGNOSIS — Z794 Long term (current) use of insulin: Secondary | ICD-10-CM

## 2024-05-26 LAB — POCT URINALYSIS DIPSTICK
Bilirubin, UA: NEGATIVE
Blood, UA: POSITIVE
Glucose, UA: POSITIVE — AB
Ketones, UA: NEGATIVE
Nitrite, UA: POSITIVE
Protein, UA: POSITIVE — AB
Spec Grav, UA: 1.01 (ref 1.010–1.025)
Urobilinogen, UA: 0.2 U/dL
pH, UA: 6 (ref 5.0–8.0)

## 2024-05-26 MED ORDER — SULFAMETHOXAZOLE-TRIMETHOPRIM 800-160 MG PO TABS: 1.0000 | ORAL_TABLET | Freq: Two times a day (BID) | ORAL | 0 refills | Status: AC

## 2024-05-26 MED ORDER — MIRABEGRON ER 25 MG PO TB24: 25.0000 mg | ORAL_TABLET | Freq: Every day | ORAL | 3 refills | Status: DC

## 2024-05-26 MED ORDER — INSULIN LISPRO (1 UNIT DIAL) 100 UNIT/ML (KWIKPEN): PEN_INJECTOR | SUBCUTANEOUS | Status: DC

## 2024-05-26 NOTE — Progress Notes (Signed)
 OFFICE VISIT  05/26/2024  CC: No chief complaint on file.   Patient is a 78 y.o. female who presents accompanied by her husband [name] for 3-week follow-up diabetes and chronic diastolic heart failure, A/P as of last visit: #1 type 2 diabetes, insulin -requiring, with diabetic neuropathy. Hemoglobin A1c is 6.8% today.  However, we will check this against a fructosamine level. The accuracy of her A1c in the setting of chronic iron  deficiency anemia and repetitive iron  infusions is questionable. She has very erratic glucose pattern/wide excursions. It seems that basal/bolus regimen has not been attainable due to insurance/cost barriers. Will ask our clinical pharmacology team to look into this again. In the meantime we will increase her morning Humalog  to 115 units, continue with midday Humalog  to 100 units, and decrease suppertime Humalog  to 75 units.   Will continue Trulicity  1.5 mg weekly and Farxiga  5 mg a day.   #2 chronic diastolic heart failure. I do think she is somewhat volume overloaded.  Fortunately she is asymptomatic from a pulmonary standpoint. Will increase Lasix  to 80 mg in the morning and continue 60 mg in the evening. Emphasized the importance of restricting sodium and fluids the best she can.   #3 chronic iron  deficiency anemia. She is getting iron  infusions through GI/rheumatology. The best I can tell from review of her chart it looks like the last infusion was just 5 days ago.  INTERIM HX: Says she is doing pretty good. Still having hypoglycemia in the middle of the night.  During the day she has spikes up into the 300 range that are usually after supper but sometimes after lunch.  She is currently giving Humalog  115-100-75.  She has been trying to limit sodium.  She has been taking 80 units of Lasix  in the morning and 60 units in the evening as instructed.  Her biggest concern is ongoing urinary incontinence. Oxybutynin  no help.  ROS as above, plus--> no fevers,  no CP, no SOB, no wheezing, no cough, no dizziness, no HAs, no rashes, no melena/hematochezia.  No polyuria or polydipsia.  No myalgias or arthralgias.  No focal weakness, paresthesias, or tremors.  No acute vision or hearing abnormalities.  No recent changes in lower legs. No n/v/d or abd pain.  No palpitations.      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 ? to get liver bx if she gets bariatric surgery? Mild portal hypertensive gastropathy on EGD 08/2019.   Fibromyalgia    GAD (generalized anxiety disorder)    GERD    History of hiatal hernia    History of iron  deficiency anemia 12/2018   Inadequate absorption secondary to chronic/long term PPI therapy + portal hypertensive gastroduodenopathy. No GIB found on EGD, colonosc, and givens. Iron  infusions X multiple.   History of thrombocytopenia 12/2011   Hyperlipidemia    Intolerant of statins   HYPERTENSION    IBS (irritable bowel syndrome)    -D.  Good response to bentyl  and imodium  as of 06/2018 GI f/u.   IDDM (insulin  dependent diabetes mellitus)    with DPN (managed by Dr. Trixie but then in 2018 pt preferred to have me manage for her convenience)   Limited mobility    Requires a walker for arthritic pain, widespread musculoskeletal pain, and neuropathic pain.   Morbid obesity (  HCC)    As of 11/2018, pt considering sleeve gastectomy vs bipass as of eval by Dr. Gael considering as of 12/2019.   Nonalcoholic steatohepatitis    Viral Hep screens NEG.  CT 2015.  Transaminasemia.  U/S 07/2018 showed early changes of cirrhosis.   OSA (obstructive sleep apnea) 09/14/2015   sleep study 09/07/15: severe obstructive sleep apnea with an AHI of 72 and SaO2 low of 75%.>referred to sleep MD   Osteoarthritis    hips, shoulders, knees   PAF  (paroxysmal atrial fibrillation) (HCC)    One documented episode (after getting EGD 2016).  Was on amiodarone  x 3 mo.  Rate control with metoprolol  + anticoag with xarelto . Watchman 12/2022, off anticoag   Portal hypertensive gastropathy (HCC)    hemorrhagic gastropathy + non-bleeding gastric ulcers on EGD 08/2023   Recurrent epistaxis    Granuloma in L nare cauterized by ENT 04/2020. Another cautery 06/2020   Small fiber neuropathy    Due to DM.  Symmetric hands and feet tingling/numbness.   Ulcerative colitis (HCC)    Remicade  infusion Q 8 weeks: in clinical and endoscopic remission as of 12/2018 GI f/u.  06/11/19 rpt colonoscopy->cecal and ascending colon colitis.    Past Surgical History:  Procedure Laterality Date   ABDOMINAL HYSTERECTOMY  1980   Paps no longer indicated.   BACK SURGERY     BACTERIAL OVERGROWTH TEST N/A 07/13/2015   Procedure: BACTERIAL OVERGROWTH TEST;  Surgeon: Claudis RAYMOND Rivet, MD;  [facility];  Service: Endoscopy;  Laterality: N/A;  730     BILATERAL SALPINGOOPHORECTOMY  02/10/2001   BIOPSY  06/11/2019   Procedure: BIOPSY;  Surgeon: Rivet Claudis RAYMOND, MD;  [facility]: [facility];  Service: Endoscopy;;  colon   BIOPSY  09/27/2019   Procedure: BIOPSY;  Surgeon: Rivet Claudis RAYMOND, MD;  [facility]: [facility];  Service: Endoscopy;;  gastric duodenal   BIOPSY  09/15/2020   Procedure: BIOPSY;  Surgeon: Eartha Angelia Sieving, MD;  [facility]: [facility];  Service: Gastroenterology;;   BIOPSY  09/25/2023   Procedure: BIOPSY;  Surgeon: Eartha Angelia Sieving, MD;  [facility]: [facility];  Service: Gastroenterology;;   BREAST REDUCTION SURGERY  1994   bilat   BREAST SURGERY     CARDIOVASCULAR STRESS TEST  07/2010   Lexiscan myoview: normal   CARPAL TUNNEL RELEASE Right 1996   CARPAL TUNNEL RELEASE Left 03/21/2003   CARPAL TUNNEL RELEASE Right 05/04/2013   Procedure: CARPAL TUNNEL RELEASE;  Surgeon: Lamar LULLA Leonor Mickey., MD;  Location: [facility];  Service: Orthopedics;  Laterality: Right;   CARPAL TUNNEL RELEASE Left 09/21/2013   Procedure: LEFT CARPAL TUNNEL RELEASE;  Surgeon: Lamar LULLA Leonor Mickey., MD;  Location: [facility];  Service: Orthopedics;  Laterality: Left;   CHOLECYSTECTOMY     COLONOSCOPY WITH PROPOFOL  N/A 08/04/2015   Colitis in remission.  No polyps.  Procedure: COLONOSCOPY WITH PROPOFOL ;  Surgeon: Claudis RAYMOND Rivet, MD;  [facility]: [facility];  Service: Endoscopy;  Laterality: N/A;  cecum time in  0820   time out  0827    total time 7 minutes   COLONOSCOPY WITH PROPOFOL  N/A 06/11/2019   cecal and ascending colon colitis.  Procedure: COLONOSCOPY WITH PROPOFOL ;  Surgeon: Rivet Claudis RAYMOND, MD;  [facility];  Service: Endoscopy;  Laterality: N/A;  730a   COLONOSCOPY WITH PROPOFOL  N/A 09/15/2020   Procedure: COLONOSCOPY WITH PROPOFOL ;  Surgeon: Eartha Angelia Sieving, MD;  [facility];  Service: Gastroenterology;  Laterality: N/A;  1030   ESOPHAGEAL DILATION N/A 08/04/2015   Procedure: ESOPHAGEAL DILATION;  Surgeon: Claudis RAYMOND Rivet, MD;  Location: [facility];  Service: Endoscopy;  Laterality: N/AMERL Agapito MOH, no mucousal disruption   ESOPHAGOGASTRODUODENOSCOPY  09/27/2019   Performed for IDA.  Esoph dilation was done but no stricture present.  Mild portal hypertensive gastropathy, o/w normal.  Duodenal bx NEG.  h pylori neg.   ESOPHAGOGASTRODUODENOSCOPY     09/25/2023 inactive chronic gastritis, h pylor neg. benign   ESOPHAGOGASTRODUODENOSCOPY     09/25/23 hemorrhagic portal gastropathy + nonbleeding gastric ulcers (H pylori neg)   ESOPHAGOGASTRODUODENOSCOPY (EGD) WITH ESOPHAGEAL DILATION  12/02/2005   ESOPHAGOGASTRODUODENOSCOPY (EGD) WITH PROPOFOL  N/A 08/04/2015   Procedure: ESOPHAGOGASTRODUODENOSCOPY (EGD) WITH PROPOFOL ;  Surgeon: Claudis RAYMOND Rivet, MD;  [facility]: [facility];  Service: Endoscopy;  Laterality: N/A;  procedure 1   ESOPHAGOGASTRODUODENOSCOPY (EGD) WITH PROPOFOL  N/A  09/27/2019   Procedure: ESOPHAGOGASTRODUODENOSCOPY (EGD) WITH PROPOFOL ;  Surgeon: Rivet Claudis RAYMOND, MD;  [facility];  Service: Endoscopy;  Laterality: N/A;  12:10   ESOPHAGOGASTRODUODENOSCOPY (EGD) WITH PROPOFOL  N/A 10/31/2021   Procedure: ESOPHAGOGASTRODUODENOSCOPY (EGD) WITH PROPOFOL ;  Surgeon: Rivet Claudis RAYMOND, MD;  [facility];  Service: Endoscopy;  Laterality: N/A;  930   ESOPHAGOGASTRODUODENOSCOPY (EGD) WITH PROPOFOL  N/A 09/25/2023   Procedure: ESOPHAGOGASTRODUODENOSCOPY (EGD) WITH PROPOFOL ;  Surgeon: Eartha Angelia Sieving, MD;  [facility];  Service: Gastroenterology;  Laterality: N/A;  10:30AM;ASA 3   FLEXIBLE SIGMOIDOSCOPY  01/17/2012   Procedure: FLEXIBLE SIGMOIDOSCOPY;  Surgeon: Claudis RAYMOND Rivet, MD;  [facility];  Service: Endoscopy;  Laterality: N/A;   GIVENS CAPSULE STUDY N/A 08/03/2019   Procedure: GIVENS CAPSULE STUDY (performed for IDA)->some food debris in stomach and small amount of blood.  Surgeon: Rivet Claudis RAYMOND, MD;  [facility];  Service: Endoscopy;  Laterality: N/A;  730AM   HEMILAMINOTOMY LUMBAR SPINE Bilateral 09/07/1999   L4-5   HOT HEMOSTASIS  09/25/2023   Procedure: HOT HEMOSTASIS (ARGON PLASMA COAGULATION/BICAP);  Surgeon: Eartha Angelia, Sieving, MD;  [facility];  Service: Gastroenterology;;   KNEE ARTHROSCOPY Right 01/1999; 10/2000   LEFT ATRIAL APPENDAGE OCCLUSION N/A 01/23/2023   Procedure: LEFT ATRIAL APPENDAGE OCCLUSION;  Surgeon: Wonda Sharper, MD;  Location: [facility];  Service: Cardiovascular;  Laterality: N/A;   LEFT ATRIAL APPENDAGE OCCLUSION     LYSIS OF ADHESION  02/10/2001   MALONEY DILATION  09/27/2019   Procedure: MALONEY DILATION;  Surgeon: Rivet Claudis RAYMOND, MD;  [facility];  Service: Endoscopy;;   NASAL ENDOSCOPY WITH EPISTAXIS CONTROL Bilateral 01/25/2022   Procedure: NASAL ENDOSCOPY WITH EPISTAXIS CONTROL;  Surgeon: Mable Lenis, MD;   Location: [facility];  Service: ENT;  Laterality: Bilateral;   NASAL SEPTOPLASTY W/ TURBINOPLASTY Bilateral 01/25/2022   Procedure: NASAL SEPTOPLASTY WITH TURBINATE REDUCTION;  Surgeon: Mable Lenis, MD;  Location: [facility];  Service: ENT;  Laterality: Bilateral;   RECTOCELE REPAIR  1990; 09/12/2006   TARSAL TUNNEL RELEASE  2002   TEE WITHOUT CARDIOVERSION N/A 01/23/2023   Procedure: TRANSESOPHAGEAL ECHOCARDIOGRAM;  Surgeon: Wonda Sharper, MD;  Location: [facility]VAS[facility] CV LAB;  Service: Cardiovascular;  Laterality: N/A;   TRANSESOPHAGEAL ECHOCARDIOGRAM (CATH LAB) N/A 08/26/2023   4mm leak around watchman device. Procedure: TRANSESOPHAGEAL ECHOCARDIOGRAM;  Surgeon: Loni Soyla LABOR, MD;  Location: [facility] CV LAB;  Service: Cardiovascular;  Laterality: N/A;   TRANSTHORACIC ECHOCARDIOGRAM  08/04/2015   EF 60-65%, normal  wall motion, mild LVH, mild LA dilation, grd I DD.  11/2023 overall poor quality, EF 55-60%   TUMOR EXCISION Left 03/21/2003   dorsal 1st web space (hand)   URETEROLYSIS Right 02/10/2001    Outpatient Medications Prior to Visit  Medication Sig Dispense Refill   acetaminophen  (TYLENOL ) 500 MG tablet Take 1,000 mg by mouth every 6 (six) hours as needed for mild pain (pain score 1-3).     albuterol  (VENTOLIN  HFA) 108 (90 Base) MCG/ACT inhaler Inhale 2 puffs into the lungs every 6 (six) hours as needed for wheezing or shortness of breath. 8 g 0   aspirin  EC 81 MG tablet Take 1 tablet (81 mg total) by mouth daily. Swallow whole.     citalopram  (CELEXA ) 20 MG tablet Take 1 tablet (20 mg total) by mouth daily. 90 tablet 1   Continuous Glucose Receiver (FREESTYLE LIBRE 3 READER) DEVI Use as directed to check blood sugars 1 each 11   Continuous Glucose Sensor (FREESTYLE LIBRE 3 PLUS SENSOR) MISC Change sensor every 15 days. 2 each 3   Continuous Glucose Sensor (FREESTYLE LIBRE 3 SENSOR) MISC 1 each by Does not apply route every 14 (fourteen) days. Please apply for 14 days and then switch  to new sensor 2 each 3   dapagliflozin  propanediol (FARXIGA ) 5 MG TABS tablet Take 1 tablet (5 mg total) by mouth daily. 30 tablet 1   Dulaglutide  (TRULICITY ) 1.5 MG/0.5ML SOAJ INJECT 1.5 MG (0.5ML) UNDER THE SKIN ONCE A WEEK 18 mL 3   esomeprazole  (NEXIUM ) 40 MG capsule Take 1 capsule (40 mg total) by mouth daily before breakfast. 90 capsule 3   furosemide  (LASIX ) 20 MG tablet 80 mg qAM and 60 mg qPM     gabapentin  (NEURONTIN ) 600 MG tablet TAKE 1 AND 1/2 TABLETS BY MOUTH TWICE A DAY (Patient taking differently: Take 500 mg by mouth daily at 6 (six) AM.) 270 tablet 1   [START ON 07/08/2024] Guselkumab  (TREMFYA  PEN) 200 MG/2ML SOAJ Inject 200 mg into the skin every 28 (twenty-eight) days. Starting 4 weeks after the last Tremfya  infusion which will be around June 10, 2024 2 mL 12   Insulin  Pen Needle (B-D UF III MINI PEN NEEDLES) 31G X 5 MM MISC USE TO INJECT INSULINS EQUAL TO 6 TIMES DAILY. 500 each 6   lactase (LACTAID) 3000 UNITS tablet Take 3,000 Units by mouth as needed (when eating foods containing dairy).      meclizine  (ANTIVERT ) 25 MG tablet Take 1 tablet (25 mg total) by mouth 3 (three) times daily as needed for dizziness or nausea. 90 tablet 2   metoprolol  succinate (TOPROL -XL) 100 MG 24 hr tablet TAKE 1 TABLET BY MOUTH 2 TIMES DAILY. TAKE WITH OR IMMEDIATELY FOLLOWING A MEAL 180 tablet 1   potassium chloride  (KLOR-CON  M) 10 MEQ tablet TAKE 1 TABLET BY MOUTH EVERY DAY 90 tablet 1   spironolactone  (ALDACTONE ) 25 MG tablet Take 1 tablet (25 mg total) by mouth daily. 90 tablet 1   insulin  lispro (HUMALOG  KWIKPEN) 100 UNIT/ML KwikPen Inject per sliding scale three times daily, as needed, not to exceed 60 units daily 3 mL 5   oxybutynin  (DITROPAN  XL) 10 MG 24 hr tablet Take 1 tablet (10 mg total) by mouth at bedtime. 90 tablet 1   Accu-Chek Softclix Lancets lancets USE TO CHECK BLOOD GLUCOSE UP TO 6 TIMES DAILY AS DIRECTED (Patient not taking: Reported on 05/26/2024) 200 each 5   blood  glucose meter kit and supplies KIT  Use up to six times daily as directed. DX. E11.9 (Patient not taking: Reported on 05/26/2024) 1 each 0   diphenoxylate -atropine  (LOMOTIL ) 2.5-0.025 MG tablet Take 1 tablet by mouth 4 (four) times daily as needed for diarrhea or loose stools. (Patient not taking: Reported on 05/26/2024) 60 tablet 2   glucose blood (ACCU-CHEK GUIDE TEST) test strip USE TO CHECK BLOOD GLUCOSE UP TO 6 TIMES DAILY. (Patient not taking: Reported on 05/26/2024) 500 strip 1   HYDROcodone -acetaminophen  (NORCO/VICODIN) 5-325 MG tablet 1 tab po bid prn pain (Patient not taking: Reported on 05/26/2024) 60 tablet 0   Facility-Administered Medications Prior to Visit  Medication Dose Route Frequency Provider Last Rate Last Admin   acetaminophen  (TYLENOL ) tablet 650 mg  650 mg Oral Once Eartha Angelia Sieving, MD       diphenhydrAMINE  (BENADRYL ) capsule 25 mg  25 mg Oral Once Eartha Angelia Sieving, MD        Allergies  Allergen Reactions   Flagyl  [Metronidazole  Hcl] Other (See Comments)    DIAPHORESIS   Omeprazole  Anaphylaxis and Swelling    SWELLING OF TONGUE AND THROAT   Pioglitazone Other (See Comments) and Swelling    Weight gain, tongue swelling   Benzocaine-Menthol Swelling    SWELLING OF MOUTH   Metformin  And Related Diarrhea   Shrimp [Shellfish Allergy] Itching    OF THROAT AND EARS, if consumed raw   Statins Palpitations   Welchol [Colesevelam] Other (See Comments)    GI UPSET   Allevyn Adhesive [Wound Dressings]     Other reaction(s): Unknown   Jardiance  [Empagliflozin ] Other (See Comments)    weakness   Lactose Diarrhea and Other (See Comments)    Gas, bloating   Lactose Intolerance (Gi) Diarrhea    Gas, bloating   Adhesive [Tape] Other (See Comments)    SKIN IRRITATION AND BRUISING   Desipramine  Hcl Itching, Nausea Only and Other (See Comments)    swimmy headed, ears itched   Hydromorphone  Itching   Nisoldipine Itching   Percocet [Oxycodone -Acetaminophen ]  Itching    Review of Systems As per HPI  PE:    05/26/2024    2:45 PM 05/12/2024    2:28 PM 05/12/2024    1:00 PM  Vitals with BMI  Height 5' 2    Weight 222 lbs    BMI 40.59    Systolic 134 144 867  Diastolic 84 79 79  Pulse 114 86 68  02 sat 93% on RA today  Physical Exam  Gen: Alert, well appearing.  Patient is oriented to person, place, time, and situation. AFFECT: pleasant, lucid thought and speech. Cardiovascular: Irregularly irregular, rate approximately 100. Lungs are clear bilaterally, breathing is nonlabored. Extremities show moderate lymphedema, 2+ pitting, symmetric.  LABS:  Last CBC Lab Results  Component Value Date   WBC 2.8 (L) 05/12/2024   HGB 10.6 (L) 05/12/2024   HCT 33.9 (L) 05/12/2024   MCV 95.5 05/12/2024   MCH 29.9 05/12/2024   RDW 20.0 (H) 05/12/2024   PLT 124 (L) 05/12/2024   Lab Results  Component Value Date   IRON  30 (L) 03/24/2024   TIBC 319 03/24/2024   FERRITIN 7 (L) 03/24/2024     Chemistry      Component Value Date/Time   NA 139 05/12/2024 1538   NA 136 (A) 02/25/2023 0000   K 3.5 05/12/2024 1538   CL 101 05/12/2024 1538   CO2 32 05/12/2024 1538   BUN 13 05/12/2024 1538   BUN 21 02/25/2023 0000  CREATININE 1.03 (H) 05/12/2024 1538   GLU 410 02/25/2023 0000      Component Value Date/Time   CALCIUM  8.8 05/12/2024 1538   ALKPHOS 63 09/19/2023 1309   AST 26 05/12/2024 1538   ALT 17 05/12/2024 1538   BILITOT 0.5 05/12/2024 1538     Lab Results  Component Value Date   TSH 0.618 10/09/2022   Lab Results  Component Value Date   HGBA1C 6.8 (A) 05/03/2024   HGBA1C 6.8 05/03/2024   HGBA1C 6.8 (A) 05/03/2024   HGBA1C 6.8 05/03/2024   Fructosamine level was 326 on 05/03/2024    IMPRESSION AND PLAN:  #1 diabetes with complications, long-term insulin  treatment, poor control. Continue 115 units of Humalog  at breakfast.  Increase lunch Humalog  108 units.  Decrease supper Humalog  to 65 units. Give your insulin  about 5 min  before eating. Repeat fructosamine today (hemoglobin A1c not accurate due to her chronic iron  deficiency anemia).  #2 chronic diastolic heart failure. Stable.  Weight has decreased some since increasing Lasix  to 80 units in the morning and 60 units in the evening. Basic metabolic panel today.  3.  Chronic urinary incontinence, mixed. No response to oxybutynin . Myrbetriq  25 mg daily prescribed today, which hopefully insurance will approve. Refer to urogynecologist today.  4.  Acute UTI.  Urinalysis today with blood, leukocytes, and glucose. Sent a urine specimen for culture. Start Bactrim  double strength, 1 twice daily x 5 days.  An After Visit Summary was printed and given to the patient.  FOLLOW UP: Return in about 3 weeks (around 06/16/2024) for f/u DM.  Signed:  Gerlene Hockey, MD           05/26/2024

## 2024-05-26 NOTE — Progress Notes (Unsigned)
 Cardiology Clinic Note   Patient Name: [name] Date of Encounter: 05/28/2024  Primary Care Provider:  Candise Aleene DEL, MD Primary Cardiologist:  Redell Shallow, MD  Patient Profile    [name] 78 year old female presents the clinic today for follow-up evaluation of her acute on chronic CHF.  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 ? to get liver bx if she gets bariatric surgery? Mild portal hypertensive gastropathy on EGD 08/2019.   Fibromyalgia    GAD (generalized anxiety disorder)    GERD    History of hiatal hernia    History of iron  deficiency anemia 12/2018   Inadequate absorption secondary to chronic/long term PPI therapy + portal hypertensive gastroduodenopathy. No GIB found on EGD, colonosc, and givens. Iron  infusions X multiple.   History of thrombocytopenia 12/2011   Hyperlipidemia    Intolerant of statins   HYPERTENSION    IBS (irritable bowel syndrome)    -D.  Good response to bentyl  and imodium  as of 06/2018 GI f/u.   IDDM (insulin  dependent diabetes mellitus)    with DPN (managed by Dr. Trixie but then in 2018 pt preferred to have me manage for her convenience)   Limited mobility    Requires a walker for arthritic pain, widespread musculoskeletal pain, and neuropathic pain.   Morbid obesity (HCC)    As of 11/2018, pt considering sleeve gastectomy vs bipass as of eval by Dr. Gael considering as of 12/2019.   Nonalcoholic steatohepatitis    Viral Hep screens NEG.  CT 2015.  Transaminasemia.  U/S 07/2018 showed early changes of cirrhosis.   OSA (obstructive sleep apnea) 09/14/2015   sleep study 09/07/15: severe obstructive sleep apnea with an AHI of 72 and SaO2 low of 75%.>referred to sleep MD   Osteoarthritis    hips,  shoulders, knees   PAF (paroxysmal atrial fibrillation) (HCC)    One documented episode (after getting EGD 2016).  Was on amiodarone  x 3 mo.  Rate control with metoprolol  + anticoag with xarelto . Watchman 12/2022, off anticoag   Portal hypertensive gastropathy (HCC)    hemorrhagic gastropathy + non-bleeding gastric ulcers on EGD 08/2023   Recurrent epistaxis    Granuloma in L nare cauterized by ENT 04/2020. Another cautery 06/2020   Small fiber neuropathy    Due to DM.  Symmetric hands and feet tingling/numbness.   Ulcerative colitis (HCC)    Remicade  infusion Q 8 weeks: in clinical and endoscopic remission as of 12/2018 GI f/u.  06/11/19 rpt colonoscopy->cecal and ascending colon colitis.   Past Surgical History:  Procedure Laterality Date   ABDOMINAL HYSTERECTOMY  1980   Paps no longer indicated.   BACK SURGERY     BACTERIAL OVERGROWTH TEST N/A 07/13/2015   Procedure: BACTERIAL OVERGROWTH TEST;  Surgeon: Claudis RAYMOND Rivet, MD;  [facility];  Service: Endoscopy;  Laterality: N/A;  730     BILATERAL SALPINGOOPHORECTOMY  02/10/2001   BIOPSY  06/11/2019   Procedure: BIOPSY;  Surgeon: Rivet Claudis RAYMOND, MD;  [facility];  Service: Endoscopy;;  colon   BIOPSY  09/27/2019   Procedure: BIOPSY;  Surgeon: Rivet Claudis RAYMOND, MD;  [facility];  Service: Endoscopy;;  gastric duodenal   BIOPSY  09/15/2020  Procedure: BIOPSY;  Surgeon: Eartha Angelia Sieving, MD;  [facility];  Service: Gastroenterology;;   BIOPSY  09/25/2023   Procedure: BIOPSY;  Surgeon: Eartha Angelia Sieving, MD;  [facility];  Service: Gastroenterology;;   BREAST REDUCTION SURGERY  1994   bilat   BREAST SURGERY     CARDIOVASCULAR STRESS TEST  07/2010   Lexiscan myoview: normal   CARPAL TUNNEL RELEASE Right 1996   CARPAL TUNNEL RELEASE Left 03/21/2003   CARPAL TUNNEL RELEASE Right 05/04/2013   Procedure: CARPAL TUNNEL RELEASE;  Surgeon: Lamar LULLA Leonor Mickey., MD;   Location: [facility];  Service: Orthopedics;  Laterality: Right;   CARPAL TUNNEL RELEASE Left 09/21/2013   Procedure: LEFT CARPAL TUNNEL RELEASE;  Surgeon: Lamar LULLA Leonor Mickey., MD;  Location: [facility];  Service: Orthopedics;  Laterality: Left;   CHOLECYSTECTOMY     COLONOSCOPY WITH PROPOFOL  N/A 08/04/2015   Colitis in remission.  No polyps.  Procedure: COLONOSCOPY WITH PROPOFOL ;  Surgeon: Claudis RAYMOND Rivet, MD;  [facility]: [facility];  Service: Endoscopy;  Laterality: N/A;  cecum time in  0820   time out  0827    total time 7 minutes   COLONOSCOPY WITH PROPOFOL  N/A 06/11/2019   cecal and ascending colon colitis.  Procedure: COLONOSCOPY WITH PROPOFOL ;  Surgeon: Rivet Claudis RAYMOND, MD;  [facility];  Service: Endoscopy;  Laterality: N/A;  730a   COLONOSCOPY WITH PROPOFOL  N/A 09/15/2020   Procedure: COLONOSCOPY WITH PROPOFOL ;  Surgeon: Eartha Angelia Sieving, MD;  [facility];  Service: Gastroenterology;  Laterality: N/A;  1030   ESOPHAGEAL DILATION N/A 08/04/2015   Procedure: ESOPHAGEAL DILATION;  Surgeon: Claudis RAYMOND Rivet, MD;  [facility];  Service: Endoscopy;  Laterality: N/AMERL Agapito MOH, no mucousal disruption   ESOPHAGOGASTRODUODENOSCOPY  09/27/2019   Performed for IDA.  Esoph dilation was done but no stricture present.  Mild portal hypertensive gastropathy, o/w normal.  Duodenal bx NEG.  h pylori neg.   ESOPHAGOGASTRODUODENOSCOPY     09/25/2023 inactive chronic gastritis, h pylor neg. benign   ESOPHAGOGASTRODUODENOSCOPY     09/25/23 hemorrhagic portal gastropathy + nonbleeding gastric ulcers (H pylori neg)   ESOPHAGOGASTRODUODENOSCOPY (EGD) WITH ESOPHAGEAL DILATION  12/02/2005   ESOPHAGOGASTRODUODENOSCOPY (EGD) WITH PROPOFOL  N/A 08/04/2015   Procedure: ESOPHAGOGASTRODUODENOSCOPY (EGD) WITH PROPOFOL ;  Surgeon: Claudis RAYMOND Rivet, MD;  [facility]: [facility];  Service: Endoscopy;  Laterality: N/A;  procedure 1   ESOPHAGOGASTRODUODENOSCOPY (EGD)  WITH PROPOFOL  N/A 09/27/2019   Procedure: ESOPHAGOGASTRODUODENOSCOPY (EGD) WITH PROPOFOL ;  Surgeon: Rivet Claudis RAYMOND, MD;  [facility];  Service: Endoscopy;  Laterality: N/A;  12:10   ESOPHAGOGASTRODUODENOSCOPY (EGD) WITH PROPOFOL  N/A 10/31/2021   Procedure: ESOPHAGOGASTRODUODENOSCOPY (EGD) WITH PROPOFOL ;  Surgeon: Rivet Claudis RAYMOND, MD;  [facility];  Service: Endoscopy;  Laterality: N/A;  930   ESOPHAGOGASTRODUODENOSCOPY (EGD) WITH PROPOFOL  N/A 09/25/2023   Procedure: ESOPHAGOGASTRODUODENOSCOPY (EGD) WITH PROPOFOL ;  Surgeon: Eartha Angelia Sieving, MD;  [facility];  Service: Gastroenterology;  Laterality: N/A;  10:30AM;ASA 3   FLEXIBLE SIGMOIDOSCOPY  01/17/2012   Procedure: FLEXIBLE SIGMOIDOSCOPY;  Surgeon: Claudis RAYMOND Rivet, MD;  [facility];  Service: Endoscopy;  Laterality: N/A;   GIVENS CAPSULE STUDY N/A 08/03/2019   Procedure: GIVENS CAPSULE STUDY (performed for IDA)->some food debris in stomach and small amount of blood.  Surgeon: Rivet Claudis RAYMOND, MD;  [facility];  Service: Endoscopy;  Laterality: N/A;  730AM   HEMILAMINOTOMY LUMBAR  SPINE Bilateral 09/07/1999   L4-5   HOT HEMOSTASIS  09/25/2023   Procedure: HOT HEMOSTASIS (ARGON PLASMA COAGULATION/BICAP);  Surgeon: Eartha Flavors, Toribio, MD;  [facility];  Service: Gastroenterology;;   KNEE ARTHROSCOPY Right 01/1999; 10/2000   LEFT ATRIAL APPENDAGE OCCLUSION N/A 01/23/2023   Procedure: LEFT ATRIAL APPENDAGE OCCLUSION;  Surgeon: Wonda Sharper, MD;  Location: [facility];  Service: Cardiovascular;  Laterality: N/A;   LEFT ATRIAL APPENDAGE OCCLUSION     LYSIS OF ADHESION  02/10/2001   MALONEY DILATION  09/27/2019   Procedure: MALONEY DILATION;  Surgeon: Golda Claudis PENNER, MD;  [facility];  Service: Endoscopy;;   NASAL ENDOSCOPY WITH EPISTAXIS CONTROL Bilateral 01/25/2022   Procedure: NASAL ENDOSCOPY WITH EPISTAXIS CONTROL;  Surgeon: Mable Lenis, MD;  Location[facility];  Service: ENT;  Laterality: Bilateral;   NASAL SEPTOPLASTY W/ TURBINOPLASTY Bilateral 01/25/2022   Procedure: NASAL SEPTOPLASTY WITH TURBINATE REDUCTION;  Surgeon: Mable Lenis, MD;  Location: [facility] OR;  Service: ENT;  Laterality: Bilateral;   RECTOCELE REPAIR  1990; 09/12/2006   TARSAL TUNNEL RELEASE  2002   TEE WITHOUT CARDIOVERSION N/A 01/23/2023   Procedure: TRANSESOPHAGEAL ECHOCARDIOGRAM;  Surgeon: Wonda Sharper, MD;  Location: [facility]VAS[facility] CV LAB;  Service: Cardiovascular;  Laterality: N/A;   TRANSESOPHAGEAL ECHOCARDIOGRAM (CATH LAB) N/A 08/26/2023   4mm leak around watchman device. Procedure: TRANSESOPHAGEAL ECHOCARDIOGRAM;  Surgeon: Loni Soyla LABOR, MD;  Location: [facility]VAS[facility] CV LAB;  Service: Cardiovascular;  Laterality: N/A;   TRANSTHORACIC ECHOCARDIOGRAM  08/04/2015   EF 60-65%, normal wall motion, mild LVH, mild LA dilation, grd I DD.  11/2023 overall poor quality, EF 55-60%   TUMOR EXCISION Left 03/21/2003   dorsal 1st web space (hand)   URETEROLYSIS Right 02/10/2001    Allergies  Allergies  Allergen Reactions   Flagyl  [Metronidazole  Hcl] Other (See Comments)    DIAPHORESIS   Omeprazole  Anaphylaxis and Swelling    SWELLING OF TONGUE AND THROAT   Pioglitazone Other (See Comments) and Swelling    Weight gain, tongue swelling   Benzocaine-Menthol Swelling    SWELLING OF MOUTH   Metformin  And Related Diarrhea   Shrimp [Shellfish Allergy] Itching    OF THROAT AND EARS, if consumed raw   Statins Palpitations   Welchol [Colesevelam] Other (See Comments)    GI UPSET   Allevyn Adhesive [Wound Dressings]     Other reaction(s): Unknown   Jardiance  [Empagliflozin ] Other (See Comments)    weakness   Lactose Diarrhea and Other (See Comments)    Gas, bloating   Lactose Intolerance (Gi) Diarrhea    Gas, bloating   Adhesive [Tape] Other (See Comments)    SKIN IRRITATION AND BRUISING   Desipramine  Hcl Itching, Nausea Only and Other (See  Comments)    swimmy headed, ears itched   Hydromorphone  Itching   Nisoldipine Itching   Percocet [Oxycodone -Acetaminophen ] Itching    History of Present Illness    [name] has a PMH of chronic diastolic CHF, paroxysmal atrial fibrillation (status post watchman 4/24), CAD, type 2 diabetes, OSA on CPAP, HTN, HLD, nonalcoholic cirrhosis, diabetes, and morbid obesity.  She presented to the [facility] emergency department on 10/31/2023.  She reported progressive shortness of breath and lower extremity swelling.  She was seen and evaluated by cardiology in the ED.  She noted onset of her symptoms 2 days prior to admission.  She declined headache, chest pain, abdominal pain, fever and chills.  She was admitted and received IV diuresis.  Her dyspnea improved.  Her echocardiogram showed an LVEF of 55-60%.  She was discharged on furosemide  60 mg twice daily.  Lower extremity support stockings were recommended.  Fluid restriction was recommended.  Repeat BMP in 1 week and cardiology follow-up in 2 weeks.  She was instructed to weigh herself daily and maintain a weight log.  She presented to the clinic 11/14/23 for follow-up evaluation and preoperative cardiac evaluation.  She stated she drank a lot of fluid.  She enjoyed water  and Coke.  She had been drinking 2 bottles of Coke per day and also drinking water .  Her weight was noted to be 218 pounds.  She continued to be in atrial fibrillation.  Initially her pulse was 101 and on recheck was 88.  We reviewed the importance of fluid restriction, low-sodium diet, and lower extremity support stockings.  She and her husband expressed understanding.  I gave her a weight log.  Her recent blood work shows stable renal function and electrolytes.  She noted that when she was in the hospital she did receive breathing treatments.  She requested a albuterol  inhaler.  I recommended that she have this filled by her PCP.  She had been somewhat physically active doing  physical therapy 2 times per week.  I recommended that she continue to do physical therapy exercises on her own.  I continued her current medication regimen and planned follow-up in 3 to 4 months.  She presents to the clinic today for follow-up evaluation and states they had a hard time finding our location today.  They went to several different floors before arriving at the right location.  This was stressful to her.  She remains stable from a cardiac standpoint.  Her heart rate initially today was over 100.  On exam her pulse was noted to be 96.  She is very sedentary.  She needs assistance with activities of daily living and only walks around her house to the bathroom, and to the kitchen.  I will increase her metoprolol  to 150 mg twice daily, order physical therapy for lower extremity generalized weakness, and follow-up in 4 to 6 months.  I have asked her to avoid caffeine, chocolate, EtOH, dehydration etc.  We reviewed her most recent echocardiogram.  She and her husband expressed understanding.  Today she denies chest pain, increased shortness of breath,fatigue, palpitations, melena, hematuria, hemoptysis, diaphoresis, weakness, presyncope, syncope, orthopnea, and PND.     Home Medications    Prior to Admission medications   Medication Sig Start Date End Date Taking? Authorizing Provider  Accu-Chek Softclix Lancets lancets USE TO CHECK BLOOD GLUCOSE UP TO 6 TIMES DAILY AS DIRECTED 02/13/23   McGowen, Aleene DEL, MD  acetaminophen  (TYLENOL ) 500 MG tablet Take 1,000 mg by mouth every 6 (six) hours as needed for mild pain (pain score 1-3).    [provider]  aspirin  EC 81 MG tablet Take 1 tablet (81 mg total) by mouth daily. Swallow whole. 07/23/23   McDaniel, Jill D, NP  blood glucose meter kit and supplies KIT Use up to six times daily as directed. DX. E11.9 01/05/20   McGowen, Aleene DEL, MD  canagliflozin  (INVOKANA ) 100 MG TABS tablet Take 1 tablet (100 mg total) by mouth daily before  breakfast. 05/29/23   McGowen, Aleene DEL, MD  citalopram  (CELEXA ) 20 MG tablet Take 1 tablet (20 mg total) by mouth daily. 05/29/23   McGowen, Aleene DEL, MD  diphenoxylate -atropine  (LOMOTIL ) 2.5-0.025 MG tablet Take 1 tablet by mouth 4 (four)  times daily as needed for diarrhea or loose stools. Patient not taking: Reported on 11/07/2023 12/10/22   Eartha Flavors, Toribio, MD  Dulaglutide  (TRULICITY ) 1.5 MG/0.5ML SOAJ INJECT 1.5 MG (0.5ML) UNDER THE SKIN ONCE A WEEK 11/07/23   McGowen, Aleene DEL, MD  esomeprazole  (NEXIUM ) 40 MG capsule Take 1 capsule (40 mg total) by mouth daily before breakfast. 09/25/23   Eartha Flavors, Toribio, MD  furosemide  (LASIX ) 20 MG tablet Take 3 tablets (60 mg total) by mouth 2 (two) times daily. 11/05/23 02/03/24  [place], Camellia PARAS, DO  gabapentin  (NEURONTIN ) 600 MG tablet TAKE 1 AND 1/2 TABLETS BY MOUTH TWICE A DAY 09/30/23   McGowen, Aleene DEL, MD  glucose blood (ACCU-CHEK GUIDE) test strip USE TO CHECK BLOOD GLUCOSE UP TO 6 TIMES DAILY AS DIRECTED 03/04/23   McGowen, Aleene DEL, MD  HYDROcodone -acetaminophen  (NORCO/VICODIN) 5-325 MG tablet 1-2 tabs po bid prn pain Patient not taking: Reported on 11/07/2023 09/30/23   McGowen, Philip H, MD  INFLIXIMAB  IV Inject into the vein every 8 (eight) weeks. Remicaid 5mg /kg Infusion every 8 weeks [facility]) Patient not taking: Reported on 11/07/2023    [provider]  Insulin  Pen Needle (B-D UF III MINI PEN NEEDLES) 31G X 5 MM MISC USE TO INJECT INSULINS EQUAL TO 6 TIMES DAILY. 03/04/23   McGowen, Aleene DEL, MD  insulin  regular human CONCENTRATED (HUMULIN  R U-500 KWIKPEN) 500 UNIT/ML KwikPen Inject 100 units into the skin at breakfast and lunch. 06/05/23   McGowen, Aleene DEL, MD  lactase (LACTAID) 3000 UNITS tablet Take 3,000 Units by mouth as needed (when eating foods containing dairy).     [provider]  meclizine  (ANTIVERT ) 25 MG tablet Take 1 tablet (25 mg total) by mouth 3 (three) times daily as needed for dizziness  or nausea. Patient not taking: Reported on 11/07/2023 12/07/15   Geofm Glade PARAS, MD  metoprolol  succinate (TOPROL -XL) 100 MG 24 hr tablet TAKE 1 TABLET BY MOUTH 2 TIMES DAILY. TAKE WITH OR IMMEDIATELY FOLLOWING A MEAL 05/29/23   McGowen, Aleene DEL, MD  oxybutynin  (DITROPAN  XL) 5 MG 24 hr tablet Take 1 tablet (5 mg total) by mouth at bedtime. Patient not taking: Reported on 10/31/2023 08/08/23   McGowen, Philip H, MD  potassium chloride  (KLOR-CON  M10) 10 MEQ tablet Take 1 tablet (10 mEq total) by mouth daily. Patient taking differently: Take 20 mEq by mouth daily. 05/29/23   McGowen, Aleene DEL, MD  bromocriptine (PARLODEL) 2.5 MG tablet Take 2.5 mg by mouth 2 (two) times daily.  12/05/11  [provider]    Family History    Family History  Problem Relation Age of Onset   Diabetes Mother    Hypertension Mother    Heart attack Father        Mid 3's   Heart disease Father    Lung disease Father        spot on lung; had lung surgery   Alcohol abuse Other    Hypertension Son    Diabetes Son    Emphysema Maternal Grandfather    Asthma Maternal Grandfather    Colon cancer Paternal Grandfather    Stomach cancer Paternal Grandmother    Neuropathy Neg Hx    She indicated that her mother is deceased. She indicated that her father is deceased. She indicated that her maternal grandmother is deceased. She indicated that her maternal grandfather is deceased. She indicated that her paternal grandmother is deceased. She indicated that her paternal grandfather is deceased.  She indicated that the status of her son is unknown. She indicated that the status of her neg hx is unknown. She indicated that the status of her other is unknown.  Social History    Social History   Socioeconomic History   Marital status: Married    Spouse name: Not on file   Number of children: Not on file   Years of education: Not on file   Highest education level: Not on file  Occupational History   Occupation: Retired   Tobacco Use   Smoking status: Never   Smokeless tobacco: Never  Vaping Use   Vaping status: Never Used  Substance and Sexual Activity   Alcohol use: Not Currently    Comment: ocassionally   Drug use: No   Sexual activity: Not Currently    Partners: Male    Birth control/protection: Surgical    Comment: hysterectomy  Other Topics Concern   Not on file  Social History Narrative   She lives with husband in [place] home.  They have one grown son and 2 grandchildren.   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   Highest of level education:  Some college.   Never smoker.   Alcohol: rare.   Social Drivers of Corporate investment banker Strain: Low Risk  (01/21/2024)   Overall Financial Resource Strain (CARDIA)    Difficulty of Paying Living Expenses: Not hard at all  Food Insecurity: No Food Insecurity (01/21/2024)   Hunger Vital Sign    Worried About Running Out of Food in the Last Year: Never true    Ran Out of Food in the Last Year: Never true  Transportation Needs: No Transportation Needs (01/21/2024)   PRAPARE - Administrator, Civil Service (Medical): No    Lack of Transportation (Non-Medical): No  Physical Activity: Inactive (01/21/2024)   Exercise Vital Sign    Days of Exercise per Week: 0 days    Minutes of Exercise per Session: 0 min  Stress: No Stress Concern Present (01/21/2024)   [employer] of Occupational Health - Occupational Stress Questionnaire    Feeling of Stress : Not at all  Social Connections: Moderately Isolated (01/21/2024)   Social Connection and Isolation Panel    Frequency of Communication with Friends and Family: Three times a week    Frequency of Social Gatherings with Friends and Family: Three times a week    Attends Religious Services: Never    Active Member of Clubs or Organizations: No    Attends Banker Meetings: Never    Marital Status: Married  Catering manager Violence: Not At Risk (11/07/2023)   Humiliation, Afraid,  Rape, and Kick questionnaire    Fear of Current or Ex-Partner: No    Emotionally Abused: No    Physically Abused: No    Sexually Abused: No     Review of Systems    General:  No chills, fever, night sweats or weight changes.  Cardiovascular:  No chest pain, dyspnea on exertion, edema, orthopnea, palpitations, paroxysmal nocturnal dyspnea. Dermatological: No rash, lesions/masses Respiratory: No cough, dyspnea Urologic: No hematuria, dysuria Abdominal:   No nausea, vomiting, diarrhea, bright red blood per rectum, melena, or hematemesis Neurologic:  No visual changes, wkns, changes in mental status. All other systems reviewed and are otherwise negative except as noted above.  Physical Exam    VS:  BP 108/74   Pulse 96   Ht 5' 2 (1.575 m)   Wt 220 lb (99.8 kg)  SpO2 97%   BMI 40.24 kg/m  , BMI Body mass index is 40.24 kg/m. GEN: Well nourished, well developed, in no acute distress. HEENT: normal. Neck: Supple, no JVD, carotid bruits, or masses. Cardiac: RRR, no murmurs, rubs, or gallops. No clubbing, cyanosis, bilateral lower extremity 1+ slight pitting edema.  Radials/DP/PT 2+ and equal bilaterally.  Respiratory:  Respirations regular and unlabored, clear to auscultation bilaterally. GI: Soft, nontender, nondistended, BS + x 4. MS: no deformity or atrophy. Skin: warm and dry, no rash. Neuro:  Strength and sensation are intact. Psych: Normal affect.  Accessory Clinical Findings    Recent Labs: 11/05/2023: B Natriuretic Peptide 537.0 01/09/2024: Magnesium  1.8 05/12/2024: ALT 17; Hemoglobin 10.6; Platelets 124 05/26/2024: BUN 13; Creatinine, Ser 0.97; Potassium 3.9; Sodium 141   Recent Lipid Panel    Component Value Date/Time   CHOL 149 05/06/2023 1450   TRIG 123 05/06/2023 1450   TRIG 196 01/26/2010 0000   HDL 38 (L) 05/06/2023 1450   CHOLHDL 3.9 05/06/2023 1450   CHOLHDL 5 08/28/2022 1019   VLDL 27.8 08/28/2022 1019   LDLCALC 89 05/06/2023 1450   LDLDIRECT 69.5  01/24/2011 1125         ECG personally reviewed by me today- EKG Interpretation Date/Time:  Friday May 28 2024 15:49:45 EDT Ventricular Rate:  104 PR Interval:    QRS Duration:  78 QT Interval:  356 QTC Calculation: 468 R Axis:   -54  Text Interpretation: Atrial fibrillation with rapid ventricular response Left axis deviation Confirmed by Emelia Hazy [phone]) on 05/28/2024 3:54:00 PM   Echocardiogram 11/02/2023  IMPRESSIONS     1. Technically difficult study with poor echo windows, despite Definity   contrast   2. Left ventricular ejection fraction, by estimation, is 55 to 60%. The  left ventricle has normal function. Left ventricular endocardial border  not optimally defined to evaluate regional wall motion. Left ventricular  diastolic function could not be  evaluated.   3. Right ventricular systolic function was not well visualized. The right  ventricular size is not well visualized.   4. The mitral valve was not well visualized. Trivial mitral valve  regurgitation.   5. The aortic valve was not well visualized. Aortic valve regurgitation  is not visualized.   Comparison(s): Changes from prior study are noted. 08/26/2023: LVEF  60-65%.   FINDINGS   Left Ventricle: Left ventricular ejection fraction, by estimation, is 55  to 60%. The left ventricle has normal function. Left ventricular  endocardial border not optimally defined to evaluate regional wall motion.  Definity  contrast agent was given IV to  delineate the left ventricular endocardial borders. The left ventricular  internal cavity size was normal in size. There is no left ventricular  hypertrophy. Left ventricular diastolic function could not be evaluated  due to atrial fibrillation. Left  ventricular diastolic function could not be evaluated.   Right Ventricle: The right ventricular size is not well visualized. Right  vetricular wall thickness was not well visualized. Right ventricular  systolic  function was not well visualized.   Left Atrium: Left atrial size was normal in size.   Right Atrium: Right atrial size was normal in size.   Pericardium: There is no evidence of pericardial effusion.   Mitral Valve: The mitral valve was not well visualized. Trivial mitral  valve regurgitation.   Tricuspid Valve: The tricuspid valve is not well visualized. Tricuspid  valve regurgitation is not demonstrated.   Aortic Valve: The aortic valve was not well visualized. Aortic  valve  regurgitation is not visualized.   Pulmonic Valve: The pulmonic valve was not well visualized. Pulmonic valve  regurgitation is not visualized.   Aorta: The aortic root and ascending aorta are structurally normal, with  no evidence of dilitation.   IAS/Shunts: The interatrial septum was not well visualized.       Assessment & Plan   1. Atrial fibrillation-heart rate today 96.  Not a candidate for anticoagulation due to recurrent epistaxis.  She underwent watchman 4/24.  Patient rushing to appointment today.  Appears to be a component of deconditioning to her elevated heart rate as well. Increase  metoprolol  to 150 BID Avoid triggers caffeine, chocolate, EtOH, dehydration etc.   Chronic diastolic CHF-well compensated.  Remains fairly physically active.  Continue daily weights and fluid restriction-48-64 ounces daily Continue furosemide , metoprolol , spironolactone  Heart healthy low-sodium diet Fluid restriction-less than 64 ounces daily-reviewed Continue support stockings Continue daily weights-contact office with a weight increase of 2 to 3 pounds overnight or 5 pounds in 1 week.  Coronary artery disease-no exertional chest discomfort.   Continue aspirin , metoprolol  Increase physical activity as tolerated  Generalized lower extremity weakness-very sedentary.  Notes balance is declining. Ambulatory referral for physical therapy evaluation and treatment  Disposition: Follow-up with Dr. Pietro or  me in 4-6 months.   Josefa HERO. Ameli Sangiovanni NP-C     05/28/2024, 4:28 PM [facility] [address] Office [phone] Fax [phone]    I spent 14 minutes examining this patient, reviewing medications, and using patient centered shared decision making involving their cardiac care.   I spent  20 minutes reviewing past medical history,  medications, and prior cardiac tests.

## 2024-05-26 NOTE — Patient Instructions (Addendum)
 Continue 115 units of Humalog  at breakfast.  Increase lunch Humalog  108 units.  Decrease supper Humalog  to 65 units. Give your insulin  about 5 min before eating.

## 2024-05-27 ENCOUNTER — Telehealth: Payer: Self-pay

## 2024-05-27 LAB — BASIC METABOLIC PANEL WITH GFR
BUN: 13 mg/dL (ref 6–23)
CO2: 31 meq/L (ref 19–32)
Calcium: 9 mg/dL (ref 8.4–10.5)
Chloride: 100 meq/L (ref 96–112)
Creatinine, Ser: 0.97 mg/dL (ref 0.40–1.20)
GFR: 56.66 mL/min — ABNORMAL LOW (ref >60.00)
Glucose, Bld: 210 mg/dL — ABNORMAL HIGH (ref 70–99)
Potassium: 3.9 meq/L (ref 3.5–5.1)
Sodium: 141 meq/L (ref 135–145)

## 2024-05-27 NOTE — Telephone Encounter (Signed)
 Tried calling patient, unable to LVM

## 2024-05-27 NOTE — Telephone Encounter (Signed)
 Spoke with pt's husband, Johnnye regarding Humalog  and urine results, he voiced understanding. He did want to inform us  that the pharmacy had to reorder Mirabegron  (Myretriq).

## 2024-05-27 NOTE — Telephone Encounter (Signed)
-----   Message from Aleene VEAR Hockey sent at 05/26/2024  6:42 PM EDT ----- Please call patient and tell her I gave the wrong insulin  dosing instructions at today's visit. The correct dosing is Humalog  115 units at breakfast, 108 units at lunch, and 65 units at supper. Also, tell her I have sent an antibiotic to her pharmacy because her urine specimen today showed signs of infection. Thanks.

## 2024-05-28 ENCOUNTER — Encounter: Payer: Self-pay | Admitting: General Practice

## 2024-05-28 ENCOUNTER — Ambulatory Visit: Attending: General Practice | Admitting: General Practice

## 2024-05-28 ENCOUNTER — Ambulatory Visit: Payer: Self-pay | Admitting: Family Medicine

## 2024-05-28 VITALS — BP 108/74 | HR 96 | Ht 62.0 in | Wt 220.0 lb

## 2024-05-28 DIAGNOSIS — I5032 Chronic diastolic (congestive) heart failure: Secondary | ICD-10-CM

## 2024-05-28 DIAGNOSIS — I2510 Atherosclerotic heart disease of native coronary artery without angina pectoris: Secondary | ICD-10-CM

## 2024-05-28 DIAGNOSIS — R29898 Other symptoms and signs involving the musculoskeletal system: Secondary | ICD-10-CM | POA: Diagnosis not present

## 2024-05-28 DIAGNOSIS — I480 Paroxysmal atrial fibrillation: Secondary | ICD-10-CM | POA: Diagnosis not present

## 2024-05-28 LAB — URINE CULTURE
MICRO NUMBER:: 16891021
SPECIMEN QUALITY:: ADEQUATE

## 2024-05-28 MED ORDER — METOPROLOL SUCCINATE ER 100 MG PO TB24: ORAL_TABLET | ORAL | 1 refills | Status: DC

## 2024-05-28 NOTE — Patient Instructions (Addendum)
 Medication Instructions:  Start Metoprolol  Succinate 150mg . Take one and one half tablet twice a daily.  A referral was placed for physical therapy.  Avoid triggers like caffeine, chocolate, dehydration etc.  Maintain physical activity as much as possible    *If you need a refill on your cardiac medications before your next appointment, please call your pharmacy*   Lab Work: No labs were ordered during today's visit.  If you have labs (blood work) drawn today and your tests are completely normal, you will receive your results only by: MyChart Message (if you have MyChart) OR A paper copy in the mail If you have any lab test that is abnormal or we need to change your treatment, we will call you to review the results.   Testing/Procedures: No procedures were ordered during today's visit.   Your next appointment:   4-6 month(s)   Provider:   Josefa Beauvais, NP       or Dr. Pietro  A letter will be mailed to you as a reminder to call the office for your next follow up appointment.     Follow-Up: At [facility], you and your health needs are our priority.  As part of our continuing mission to provide you with exceptional heart care, we have created designated Provider Care Teams.  These Care Teams include your primary Cardiologist (physician) and Advanced Practice Providers (APPs -  Physician Assistants and Nurse Practitioners) who all work together to provide you with the care you need, when you need it. We recommend signing up for the patient portal called MyChart.  Sign up information is provided on this After Visit Summary.  MyChart is used to connect with patients for Virtual Visits (Telemedicine).  Patients are able to view lab/test results, encounter notes, upcoming appointments, etc.  Non-urgent messages can be sent to your provider as well.   To learn more about what you can do with MyChart, go to [email or web address].

## 2024-05-29 LAB — FRUCTOSAMINE: Fructosamine: 350 umol/L — ABNORMAL HIGH (ref 205–285)

## 2024-06-01 ENCOUNTER — Other Ambulatory Visit: Payer: Self-pay

## 2024-06-01 ENCOUNTER — Encounter (HOSPITAL_COMMUNITY): Payer: Self-pay

## 2024-06-01 ENCOUNTER — Inpatient Hospital Stay (HOSPITAL_COMMUNITY)
Admission: EM | Admit: 2024-06-01 | Discharge: 2024-06-03 | DRG: 291 | Disposition: A | Discharge status: Home Health | Attending: Family Medicine | Admitting: Family Medicine

## 2024-06-01 ENCOUNTER — Emergency Department (HOSPITAL_COMMUNITY)

## 2024-06-01 DIAGNOSIS — I5033 Acute on chronic diastolic (congestive) heart failure: Secondary | ICD-10-CM | POA: Diagnosis not present

## 2024-06-01 DIAGNOSIS — I110 Hypertensive heart disease with heart failure: Secondary | ICD-10-CM | POA: Diagnosis not present

## 2024-06-01 DIAGNOSIS — E785 Hyperlipidemia, unspecified: Secondary | ICD-10-CM | POA: Diagnosis present

## 2024-06-01 DIAGNOSIS — E1140 Type 2 diabetes mellitus with diabetic neuropathy, unspecified: Secondary | ICD-10-CM | POA: Diagnosis not present

## 2024-06-01 DIAGNOSIS — R0989 Other specified symptoms and signs involving the circulatory and respiratory systems: Secondary | ICD-10-CM | POA: Diagnosis not present

## 2024-06-01 DIAGNOSIS — G4733 Obstructive sleep apnea (adult) (pediatric): Secondary | ICD-10-CM | POA: Diagnosis present

## 2024-06-01 DIAGNOSIS — K7689 Other specified diseases of liver: Secondary | ICD-10-CM | POA: Diagnosis not present

## 2024-06-01 DIAGNOSIS — E876 Hypokalemia: Secondary | ICD-10-CM | POA: Diagnosis present

## 2024-06-01 DIAGNOSIS — Z7982 Long term (current) use of aspirin: Secondary | ICD-10-CM

## 2024-06-01 DIAGNOSIS — Z833 Family history of diabetes mellitus: Secondary | ICD-10-CM | POA: Diagnosis not present

## 2024-06-01 DIAGNOSIS — Z79899 Other long term (current) drug therapy: Secondary | ICD-10-CM | POA: Diagnosis not present

## 2024-06-01 DIAGNOSIS — Z90710 Acquired absence of both cervix and uterus: Secondary | ICD-10-CM

## 2024-06-01 DIAGNOSIS — F32A Depression, unspecified: Secondary | ICD-10-CM | POA: Diagnosis present

## 2024-06-01 DIAGNOSIS — J9601 Acute respiratory failure with hypoxia: Secondary | ICD-10-CM | POA: Diagnosis not present

## 2024-06-01 DIAGNOSIS — E66813 Obesity, class 3: Secondary | ICD-10-CM | POA: Diagnosis not present

## 2024-06-01 DIAGNOSIS — K7581 Nonalcoholic steatohepatitis (NASH): Secondary | ICD-10-CM | POA: Diagnosis present

## 2024-06-01 DIAGNOSIS — I509 Heart failure, unspecified: Secondary | ICD-10-CM

## 2024-06-01 DIAGNOSIS — Z7985 Long-term (current) use of injectable non-insulin antidiabetic drugs: Secondary | ICD-10-CM | POA: Diagnosis not present

## 2024-06-01 DIAGNOSIS — Z6841 Body Mass Index (BMI) 40.0 and over, adult: Secondary | ICD-10-CM

## 2024-06-01 DIAGNOSIS — F411 Generalized anxiety disorder: Secondary | ICD-10-CM | POA: Diagnosis present

## 2024-06-01 DIAGNOSIS — Z8249 Family history of ischemic heart disease and other diseases of the circulatory system: Secondary | ICD-10-CM

## 2024-06-01 DIAGNOSIS — D509 Iron deficiency anemia, unspecified: Secondary | ICD-10-CM | POA: Diagnosis not present

## 2024-06-01 DIAGNOSIS — K7469 Other cirrhosis of liver: Secondary | ICD-10-CM | POA: Diagnosis not present

## 2024-06-01 DIAGNOSIS — I480 Paroxysmal atrial fibrillation: Secondary | ICD-10-CM | POA: Diagnosis not present

## 2024-06-01 DIAGNOSIS — K5190 Ulcerative colitis, unspecified, without complications: Secondary | ICD-10-CM | POA: Diagnosis present

## 2024-06-01 DIAGNOSIS — M797 Fibromyalgia: Secondary | ICD-10-CM | POA: Diagnosis present

## 2024-06-01 DIAGNOSIS — R0600 Dyspnea, unspecified: Secondary | ICD-10-CM | POA: Diagnosis not present

## 2024-06-01 DIAGNOSIS — R230 Cyanosis: Secondary | ICD-10-CM | POA: Diagnosis present

## 2024-06-01 DIAGNOSIS — I482 Chronic atrial fibrillation, unspecified: Secondary | ICD-10-CM | POA: Diagnosis present

## 2024-06-01 DIAGNOSIS — R0602 Shortness of breath: Secondary | ICD-10-CM | POA: Diagnosis present

## 2024-06-01 DIAGNOSIS — G629 Polyneuropathy, unspecified: Secondary | ICD-10-CM | POA: Diagnosis not present

## 2024-06-01 DIAGNOSIS — J90 Pleural effusion, not elsewhere classified: Secondary | ICD-10-CM | POA: Diagnosis not present

## 2024-06-01 DIAGNOSIS — K219 Gastro-esophageal reflux disease without esophagitis: Secondary | ICD-10-CM | POA: Diagnosis present

## 2024-06-01 DIAGNOSIS — Z800 Family history of malignant neoplasm of digestive organs: Secondary | ICD-10-CM

## 2024-06-01 DIAGNOSIS — J811 Chronic pulmonary edema: Secondary | ICD-10-CM | POA: Diagnosis not present

## 2024-06-01 DIAGNOSIS — Z8711 Personal history of peptic ulcer disease: Secondary | ICD-10-CM

## 2024-06-01 DIAGNOSIS — Z825 Family history of asthma and other chronic lower respiratory diseases: Secondary | ICD-10-CM

## 2024-06-01 DIAGNOSIS — R918 Other nonspecific abnormal finding of lung field: Secondary | ICD-10-CM | POA: Diagnosis not present

## 2024-06-01 LAB — TROPONIN I (HIGH SENSITIVITY)
Troponin I (High Sensitivity): 5 ng/L (ref <18)
Troponin I (High Sensitivity): 6 ng/L (ref <18)

## 2024-06-01 LAB — TSH: TSH: 3.323 u[IU]/mL (ref 0.350–4.500)

## 2024-06-01 LAB — RESP PANEL BY RT-PCR (RSV, FLU A&B, COVID)  RVPGX2
Influenza A by PCR: NEGATIVE
Influenza B by PCR: NEGATIVE
Resp Syncytial Virus by PCR: NEGATIVE
SARS Coronavirus 2 by RT PCR: NEGATIVE

## 2024-06-01 LAB — COMPREHENSIVE METABOLIC PANEL WITH GFR
ALT: 17 U/L (ref 0–44)
AST: 33 U/L (ref 15–41)
Albumin: 3 g/dL — ABNORMAL LOW (ref 3.5–5.0)
Alkaline Phosphatase: 74 U/L (ref 38–126)
Anion gap: 13 (ref 5–15)
BUN: 16 mg/dL (ref 8–23)
CO2: 24 mmol/L (ref 22–32)
Calcium: 8.6 mg/dL — ABNORMAL LOW (ref 8.9–10.3)
Chloride: 99 mmol/L (ref 98–111)
Creatinine, Ser: 1.28 mg/dL — ABNORMAL HIGH (ref 0.44–1.00)
GFR, Estimated: 43 mL/min — ABNORMAL LOW (ref >60)
Glucose, Bld: 288 mg/dL — ABNORMAL HIGH (ref 70–99)
Potassium: 4.6 mmol/L (ref 3.5–5.1)
Sodium: 136 mmol/L (ref 135–145)
Total Bilirubin: 1.5 mg/dL — ABNORMAL HIGH (ref 0.0–1.2)
Total Protein: 7.8 g/dL (ref 6.5–8.1)

## 2024-06-01 LAB — CBC WITH DIFFERENTIAL/PLATELET
Abs Immature Granulocytes: 0.03 K/uL (ref 0.00–0.07)
Basophils Absolute: 0.1 K/uL (ref 0.0–0.1)
Basophils Relative: 1 %
Eosinophils Absolute: 0.2 K/uL (ref 0.0–0.5)
Eosinophils Relative: 1 %
HCT: 37.6 % (ref 36.0–46.0)
Hemoglobin: 12 g/dL (ref 12.0–15.0)
Immature Granulocytes: 0 %
Lymphocytes Relative: 35 %
Lymphs Abs: 4.2 K/uL — ABNORMAL HIGH (ref 0.7–4.0)
MCH: 31.1 pg (ref 26.0–34.0)
MCHC: 31.9 g/dL (ref 30.0–36.0)
MCV: 97.4 fL (ref 80.0–100.0)
Monocytes Absolute: 1 K/uL (ref 0.1–1.0)
Monocytes Relative: 8 %
Neutro Abs: 6.6 K/uL (ref 1.7–7.7)
Neutrophils Relative %: 55 %
Platelets: 161 K/uL (ref 150–400)
RBC: 3.86 MIL/uL — ABNORMAL LOW (ref 3.87–5.11)
RDW: 17.8 % — ABNORMAL HIGH (ref 11.5–15.5)
WBC: 12 K/uL — ABNORMAL HIGH (ref 4.0–10.5)
nRBC: 0 % (ref 0.0–0.2)

## 2024-06-01 LAB — BLOOD GAS, VENOUS
Acid-Base Excess: 1.4 mmol/L (ref 0.0–2.0)
Bicarbonate: 28.1 mmol/L — ABNORMAL HIGH (ref 20.0–28.0)
Drawn by: 442
O2 Saturation: 97.7 %
Patient temperature: 37.1
pCO2, Ven: 52 mmHg (ref 44–60)
pH, Ven: 7.34 (ref 7.25–7.43)
pO2, Ven: 96 mmHg — ABNORMAL HIGH (ref 32–45)

## 2024-06-01 LAB — PHOSPHORUS: Phosphorus: 4 mg/dL (ref 2.5–4.6)

## 2024-06-01 LAB — GLUCOSE, CAPILLARY
Glucose-Capillary: 288 mg/dL — ABNORMAL HIGH (ref 70–99)
Glucose-Capillary: 211 mg/dL — ABNORMAL HIGH (ref 70–99)

## 2024-06-01 LAB — BRAIN NATRIURETIC PEPTIDE: B Natriuretic Peptide: 448 pg/mL — ABNORMAL HIGH (ref 0.0–100.0)

## 2024-06-01 LAB — MRSA NEXT GEN BY PCR, NASAL: MRSA by PCR Next Gen: NOT DETECTED

## 2024-06-01 LAB — MAGNESIUM: Magnesium: 1.8 mg/dL (ref 1.7–2.4)

## 2024-06-01 MED ORDER — GABAPENTIN 100 MG PO CAPS
500.0000 mg | ORAL_CAPSULE | Freq: Every day | ORAL | Status: DC
  Administered 2024-06-02 – 2024-06-03 (×2): 500 mg via ORAL
  Filled 2024-06-01 (×2): qty 5

## 2024-06-01 MED ORDER — CHLORHEXIDINE GLUCONATE CLOTH 2 % EX PADS
6.0000 | MEDICATED_PAD | Freq: Every day | CUTANEOUS | Status: DC
  Administered 2024-06-02: 6 via TOPICAL

## 2024-06-01 MED ORDER — POTASSIUM CHLORIDE CRYS ER 20 MEQ PO TBCR: 20.0000 meq | EXTENDED_RELEASE_TABLET | Freq: Every day | ORAL | Status: DC

## 2024-06-01 MED ORDER — ONDANSETRON HCL 4 MG PO TABS: 4.0000 mg | ORAL_TABLET | Freq: Four times a day (QID) | ORAL | Status: DC | PRN

## 2024-06-01 MED ORDER — ACETAMINOPHEN 325 MG PO TABS
650.0000 mg | ORAL_TABLET | Freq: Four times a day (QID) | ORAL | Status: DC | PRN
Start: 2024-06-01 — End: 2024-06-03

## 2024-06-01 MED ORDER — CITALOPRAM HYDROBROMIDE 20 MG PO TABS
20.0000 mg | ORAL_TABLET | Freq: Every day | ORAL | Status: DC
  Administered 2024-06-01 – 2024-06-03 (×3): 20 mg via ORAL
  Filled 2024-06-01 (×3): qty 1

## 2024-06-01 MED ORDER — INSULIN ASPART 100 UNIT/ML IJ SOLN
0.0000 [IU] | Freq: Three times a day (TID) | INTRAMUSCULAR | Status: DC
  Administered 2024-06-01: 8 [IU] via SUBCUTANEOUS
  Administered 2024-06-02: 2 [IU] via SUBCUTANEOUS
  Administered 2024-06-02 – 2024-06-03 (×3): 3 [IU] via SUBCUTANEOUS

## 2024-06-01 MED ORDER — INSULIN ASPART 100 UNIT/ML IJ SOLN
5.0000 [IU] | Freq: Three times a day (TID) | INTRAMUSCULAR | Status: DC
  Administered 2024-06-01 – 2024-06-02 (×4): 5 [IU] via SUBCUTANEOUS

## 2024-06-01 MED ORDER — FUROSEMIDE 10 MG/ML IJ SOLN
40.0000 mg | Freq: Once | INTRAMUSCULAR | Status: AC
  Administered 2024-06-01: 40 mg via INTRAVENOUS
  Filled 2024-06-01: qty 4

## 2024-06-01 MED ORDER — ASPIRIN 81 MG PO TBEC
81.0000 mg | DELAYED_RELEASE_TABLET | Freq: Every day | ORAL | Status: DC
  Administered 2024-06-01 – 2024-06-03 (×3): 81 mg via ORAL
  Filled 2024-06-01 (×3): qty 1

## 2024-06-01 MED ORDER — SODIUM CHLORIDE 0.9% FLUSH
3.0000 mL | INTRAVENOUS | Status: DC | PRN
Start: 2024-06-01 — End: 2024-06-03

## 2024-06-01 MED ORDER — ACETAMINOPHEN 650 MG RE SUPP: 650.0000 mg | Freq: Four times a day (QID) | RECTAL | Status: DC | PRN

## 2024-06-01 MED ORDER — INSULIN GLARGINE-YFGN 100 UNIT/ML ~~LOC~~ SOLN
15.0000 [IU] | Freq: Two times a day (BID) | SUBCUTANEOUS | Status: DC
  Administered 2024-06-01 – 2024-06-03 (×4): 15 [IU] via SUBCUTANEOUS
  Filled 2024-06-01 (×6): qty 0.15

## 2024-06-01 MED ORDER — METOLAZONE 5 MG PO TABS
2.5000 mg | ORAL_TABLET | Freq: Every day | ORAL | Status: DC
  Administered 2024-06-01 – 2024-06-02 (×2): 2.5 mg via ORAL
  Filled 2024-06-01 (×2): qty 1

## 2024-06-01 MED ORDER — ONDANSETRON HCL 4 MG/2ML IJ SOLN: 4.0000 mg | Freq: Four times a day (QID) | INTRAMUSCULAR | Status: DC | PRN

## 2024-06-01 MED ORDER — PANTOPRAZOLE SODIUM 40 MG PO TBEC
40.0000 mg | DELAYED_RELEASE_TABLET | Freq: Every day | ORAL | Status: DC
  Administered 2024-06-01 – 2024-06-03 (×3): 40 mg via ORAL
  Filled 2024-06-01 (×3): qty 1

## 2024-06-01 MED ORDER — IOHEXOL 350 MG/ML SOLN
75.0000 mL | Freq: Once | INTRAVENOUS | Status: AC | PRN
  Administered 2024-06-01: 75 mL via INTRAVENOUS

## 2024-06-01 MED ORDER — ENOXAPARIN SODIUM 40 MG/0.4ML IJ SOSY
40.0000 mg | PREFILLED_SYRINGE | INTRAMUSCULAR | Status: DC
  Administered 2024-06-01: 40 mg via SUBCUTANEOUS
  Filled 2024-06-01: qty 0.4

## 2024-06-01 MED ORDER — DAPAGLIFLOZIN PROPANEDIOL 5 MG PO TABS
5.0000 mg | ORAL_TABLET | Freq: Every day | ORAL | Status: DC
  Administered 2024-06-02 – 2024-06-03 (×2): 5 mg via ORAL
  Filled 2024-06-01 (×3): qty 1

## 2024-06-01 MED ORDER — FUROSEMIDE 10 MG/ML IJ SOLN
60.0000 mg | Freq: Two times a day (BID) | INTRAMUSCULAR | Status: DC
  Administered 2024-06-01 – 2024-06-02 (×3): 60 mg via INTRAVENOUS
  Filled 2024-06-01 (×3): qty 6

## 2024-06-01 MED ORDER — SODIUM CHLORIDE 0.9% FLUSH
3.0000 mL | Freq: Two times a day (BID) | INTRAVENOUS | Status: DC
  Administered 2024-06-01 – 2024-06-02 (×4): 3 mL via INTRAVENOUS

## 2024-06-01 MED ORDER — SPIRONOLACTONE 25 MG PO TABS
25.0000 mg | ORAL_TABLET | Freq: Every day | ORAL | Status: DC
  Administered 2024-06-01 – 2024-06-02 (×2): 25 mg via ORAL
  Filled 2024-06-01 (×2): qty 1

## 2024-06-01 MED ORDER — METOPROLOL SUCCINATE ER 50 MG PO TB24
150.0000 mg | ORAL_TABLET | Freq: Two times a day (BID) | ORAL | Status: DC
  Administered 2024-06-01 – 2024-06-03 (×4): 150 mg via ORAL
  Filled 2024-06-01 (×4): qty 3

## 2024-06-01 MED ORDER — SODIUM CHLORIDE 0.9 % IV SOLN: 250.0000 mL | INTRAVENOUS | Status: AC | PRN

## 2024-06-01 MED ORDER — INSULIN ASPART 100 UNIT/ML IJ SOLN
0.0000 [IU] | Freq: Every day | INTRAMUSCULAR | Status: DC
  Administered 2024-06-01: 2 [IU] via SUBCUTANEOUS
  Administered 2024-06-02: 3 [IU] via SUBCUTANEOUS

## 2024-06-01 MED ORDER — NITROGLYCERIN 2 % TD OINT
1.0000 [in_us] | TOPICAL_OINTMENT | Freq: Once | TRANSDERMAL | Status: AC
  Administered 2024-06-01: 1 [in_us] via TOPICAL
  Filled 2024-06-01: qty 1

## 2024-06-01 NOTE — Progress Notes (Signed)
 Went by to check on patient during first rounds.  Patient stated her breathing was much better and she did not feel she was having trouble at this time.  Patient is currently on 3L with adequate sats between 95 to 99%.  Patient's BS are clear, but will continue to monitor patient.  Bipap is still at bedside if needed, and asked patient to alert us  if she starts having more trouble breathing.

## 2024-06-01 NOTE — Progress Notes (Signed)
 RT transported patient from ER to CT on BIPAP, without any complications. Patient was also transported to ICU on BIPAP without any complications. RN at bedside during both transports.

## 2024-06-01 NOTE — ED Triage Notes (Signed)
 Pt arrived via POV from home c/o SOB and wheezing since last night. EDP at bedside during Triage. Pts O2 Sats 68% on room air.

## 2024-06-01 NOTE — ED Notes (Signed)
 Complete bed, gown, and brief change

## 2024-06-01 NOTE — Telephone Encounter (Signed)
 Patient is scheduled for her last loading dose on 06/09/2024 and will be due for maintenance dose starting on 07/07/2024. We will need to send in the maintenance script for 200 mg SQ Every four weeks after the last loading dose scheduled on 06/09/2024.

## 2024-06-01 NOTE — ED Provider Notes (Signed)
 [place] EMERGENCY DEPARTMENT AT [facility] Provider Note   CSN: [id number] Arrival date & time: 06/01/24  9087     Patient presents with: Shortness of Breath   [name] is a 78 y.o. female.   HPI Patient presents for shortness of breath.  Medical history includes HTN, GERD, UC, atrial fibrillation, DM, anemia, cirrhosis, CHF, OSA, fibromyalgia, neuropathy.  She was most recently seen in cardiology clinic 4 days ago.  Metoprolol  was increased at that time to 150 mg twice daily.  Current home occasions include Lasix , metoprolol , spironolactone .  Current dose of Lasix  is 40 mg twice daily.  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  She is not on anticoagulation due to history of epistaxis.  Patient states that she has been adherent to her home indications.  She developed shortness of breath that has been worsening overnight and this morning.  She does not wear oxygen at baseline.    Prior to Admission medications   Medication Sig Start Date End Date Taking? Authorizing Provider  albuterol  (VENTOLIN  HFA) 108 (90 Base) MCG/ACT inhaler Inhale 2 puffs into the lungs every 6 (six) hours as needed for wheezing or shortness of breath. 11/17/23  Yes McGowen, Aleene DEL, MD  aspirin  EC 81 MG tablet Take 1 tablet (81 mg total) by mouth daily. Swallow whole. 07/23/23  Yes McDaniel, Jill D, NP  citalopram  (CELEXA ) 20 MG tablet Take 1 tablet (20 mg total) by mouth daily. 05/03/24  Yes McGowen, Aleene DEL, MD  dapagliflozin  propanediol (FARXIGA ) 5 MG TABS tablet Take 1 tablet (5 mg total) by mouth daily. 02/27/24  Yes Crain, Whitney L, PA  Dulaglutide  (TRULICITY ) 1.5 MG/0.5ML SOAJ INJECT 1.5 MG (0.5ML) UNDER THE SKIN ONCE A WEEK Patient taking differently: Inject 1.5 mg into the skin every Sunday. INJECT 1.5 MG (0.5ML) UNDER THE SKIN ONCE A WEEK 05/03/24  Yes McGowen, Aleene DEL, MD  esomeprazole  (NEXIUM ) 40 MG capsule Take 1 capsule (40 mg total) by mouth daily before breakfast. 09/25/23  Yes  Eartha Flavors, Toribio, MD  furosemide  (LASIX ) 20 MG tablet 80 mg qAM and 60 mg qPM 05/03/24  Yes McGowen, Aleene DEL, MD  gabapentin  (NEURONTIN ) 600 MG tablet TAKE 1 AND 1/2 TABLETS BY MOUTH TWICE A DAY Patient taking differently: Take 900 mg by mouth 2 (two) times daily. 09/30/23  Yes McGowen, Aleene DEL, MD  Guselkumab  (TREMFYA  PEN) 200 MG/2ML SOAJ Inject 200 mg into the skin every 28 (twenty-eight) days. Starting 4 weeks after the last Tremfya  infusion which will be around June 10, 2024 07/08/24  Yes Eartha Flavors Toribio, MD  insulin  lispro (HUMALOG  [facility]) 100 UNIT/ML KwikPen 115 units with breakfast, 108 units with lunch, and 65 U at supper 05/26/24  Yes McGowen, Aleene DEL, MD  lactase (LACTAID) 3000 UNITS tablet Take 3,000 Units by mouth as needed (when eating foods containing dairy).    Yes [provider]  metoprolol  succinate (TOPROL -XL) 100 MG 24 hr tablet TAKE 1.5 (one and one half tablet) TABLET BY MOUTH 2 TIMES DAILY. TAKE WITH OR IMMEDIATELY FOLLOWING A MEAL 05/28/24  Yes Cleaver, Josefa HERO, NP  mirabegron  ER (MYRBETRIQ ) 25 MG TB24 tablet Take 1 tablet (25 mg total) by mouth daily. 05/26/24  Yes McGowen, Aleene DEL, MD  potassium chloride  (KLOR-CON  M) 10 MEQ tablet TAKE 1 TABLET BY MOUTH EVERY DAY 02/26/24  Yes Crain, Whitney L, PA  spironolactone  (ALDACTONE ) 25 MG tablet Take 1 tablet (25 mg total) by mouth daily. 05/03/24  Yes McGowen,  Aleene DEL, MD  Accu-Chek Softclix Lancets lancets USE TO CHECK BLOOD GLUCOSE UP TO 6 TIMES DAILY AS DIRECTED 02/13/23   McGowen, Aleene DEL, MD  blood glucose meter kit and supplies KIT Use up to six times daily as directed. DX. E11.9 01/05/20   McGowen, Aleene DEL, MD  Continuous Glucose Receiver (FREESTYLE LIBRE 3 READER) DEVI Use as directed to check blood sugars 01/23/24   Crain, Whitney L, PA  Continuous Glucose Sensor (FREESTYLE LIBRE 3 PLUS SENSOR) MISC Change sensor every 15 days. 05/03/24   McGowen, Aleene DEL, MD  Continuous Glucose Sensor (FREESTYLE  LIBRE 3 SENSOR) MISC 1 each by Does not apply route every 14 (fourteen) days. Please apply for 14 days and then switch to new sensor 01/23/24   Crain, Whitney L, PA  glucose blood (ACCU-CHEK GUIDE TEST) test strip USE TO CHECK BLOOD GLUCOSE UP TO 6 TIMES DAILY. 12/08/23   McGowen, Aleene DEL, MD  Insulin  Pen Needle (B-D UF III MINI PEN NEEDLES) 31G X 5 MM MISC USE TO INJECT INSULINS EQUAL TO 6 TIMES DAILY. 05/03/24   McGowen, Aleene DEL, MD  bromocriptine (PARLODEL) 2.5 MG tablet Take 2.5 mg by mouth 2 (two) times daily.  12/05/11  [provider]    Allergies: Flagyl  [metronidazole  hcl], Omeprazole , Pioglitazone, Allevyn adhesive [wound dressings], Benzocaine-menthol, Lactose, Lactose intolerance (gi), Metformin  and related, Shrimp [shellfish allergy], Statins, Welchol [colesevelam], Adhesive [tape], Desipramine  hcl, Hydromorphone , Jardiance  [empagliflozin ], Nisoldipine, and Percocet [oxycodone -acetaminophen ]    Review of Systems  Constitutional:  Positive for fatigue.  Respiratory:  Positive for shortness of breath.   Neurological:  Positive for weakness (Generalized).  All other systems reviewed and are negative.   Updated Vital Signs BP (!) 121/55   Pulse 73   Temp 98.8 F (37.1 C) (Oral)   Resp (!) 22   Ht 5' 2 (1.575 m)   Wt 99.8 kg   SpO2 98%   BMI 40.24 kg/m   Physical Exam Vitals and nursing note reviewed.  Constitutional:      General: She is in acute distress.     Appearance: She is well-developed. She is ill-appearing. She is not toxic-appearing or diaphoretic.  HENT:     Head: Normocephalic and atraumatic.     Mouth/Throat:     Mouth: Mucous membranes are dry.  Eyes:     Conjunctiva/sclera: Conjunctivae normal.  Cardiovascular:     Rate and Rhythm: Normal rate and regular rhythm.     Heart sounds: No murmur heard. Pulmonary:     Effort: Tachypnea, accessory muscle usage and respiratory distress present.     Breath sounds: Decreased breath sounds and rales  present.  Chest:     Chest wall: No tenderness.  Abdominal:     Palpations: Abdomen is soft.     Tenderness: There is no abdominal tenderness.  Musculoskeletal:        General: No swelling.     Cervical back: Neck supple.     Right lower leg: Edema present.     Left lower leg: Edema present.  Skin:    General: Skin is warm and dry.     Coloration: Skin is cyanotic and pale.  Neurological:     General: No focal deficit present.     Mental Status: She is alert and oriented to person, place, and time.  Psychiatric:        Mood and Affect: Mood normal.        Behavior: Behavior normal.     (all  labs ordered are listed, but only abnormal results are displayed) Labs Reviewed  COMPREHENSIVE METABOLIC PANEL WITH GFR - Abnormal; Notable for the following components:      Result Value   Glucose, Bld 288 (*)    Creatinine, Ser 1.28 (*)    Calcium  8.6 (*)    Albumin 3.0 (*)    Total Bilirubin 1.5 (*)    GFR, Estimated 43 (*)    All other components within normal limits  BRAIN NATRIURETIC PEPTIDE - Abnormal; Notable for the following components:   B Natriuretic Peptide 448.0 (*)    All other components within normal limits  CBC WITH DIFFERENTIAL/PLATELET - Abnormal; Notable for the following components:   WBC 12.0 (*)    RBC 3.86 (*)    RDW 17.8 (*)    Lymphs Abs 4.2 (*)    All other components within normal limits  BLOOD GAS, VENOUS - Abnormal; Notable for the following components:   pO2, Ven 96 (*)    Bicarbonate 28.1 (*)    All other components within normal limits  RESP PANEL BY RT-PCR (RSV, FLU A&B, COVID)  RVPGX2  MRSA NEXT GEN BY PCR, NASAL  MAGNESIUM   PHOSPHORUS  TSH  TROPONIN I (HIGH SENSITIVITY)  TROPONIN I (HIGH SENSITIVITY)    EKG: None  Radiology: CT Angio Chest PE W and/or Wo Contrast Result Date: 06/01/2024 CLINICAL DATA:  Shortness of breath EXAM: CT ANGIOGRAPHY CHEST WITH CONTRAST TECHNIQUE: Multidetector CT imaging of the chest was performed using the  standard protocol during bolus administration of intravenous contrast. Multiplanar CT image reconstructions and MIPs were obtained to evaluate the vascular anatomy. RADIATION DOSE REDUCTION: This exam was performed according to the departmental dose-optimization program which includes automated exposure control, adjustment of the mA and/or kV according to patient size and/or use of iterative reconstruction technique. CONTRAST:  75mL OMNIPAQUE  IOHEXOL  350 MG/ML SOLN COMPARISON:  06/01/2024 radiograph chest CT from 10/31/2023 FINDINGS: Cardiovascular: No filling defect is identified in the pulmonary arterial tree to suggest pulmonary embolus. Mild cardiomegaly. Left atrial excluded device noted. Coronary, aortic arch, and branch vessel atherosclerotic vascular disease. Mediastinum/Nodes: Unremarkable Lungs/Pleura: Moderate bilateral pleural effusions with passive atelectasis. Hazy ground-glass opacities favoring the lung bases probably from pulmonary edema. Upper Abdomen: Nodular liver compatible with cirrhosis. Splenomegaly. Abdominal aortic atherosclerosis with atheromatous plaque at the origins of the celiac trunk and SMA. Musculoskeletal: Thoracic spondylosis. Review of the MIP images confirms the above findings. IMPRESSION: 1. No filling defect is identified in the pulmonary arterial tree to suggest pulmonary embolus. 2. Moderate bilateral pleural effusions with passive atelectasis. Hazy ground-glass opacities favoring the lung bases probably from pulmonary edema. 3. Mild cardiomegaly. 4. Nodular liver compatible with cirrhosis. Splenomegaly. 5. Thoracic spondylosis. 6.  Aortic Atherosclerosis (ICD10-I70.0). Electronically Signed   By: Jayde Daffin Salvage M.D.   On: 06/01/2024 13:44   DG Chest Port 1 View Result Date: 06/01/2024 CLINICAL DATA:  Dyspnea. EXAM: PORTABLE CHEST 1 VIEW COMPARISON:  January 09, 2024. FINDINGS: Mild cardiomegaly is noted with mild central pulmonary vascular congestion. Possible minimal  bilateral pulmonary edema is noted. Small right pleural effusion is noted. Bony thorax is unremarkable. IMPRESSION: Mild cardiomegaly with mild central pulmonary vascular congestion and possible minimal bilateral pulmonary edema. Small right pleural effusion. Electronically Signed   By: Lynwood Landy Raddle M.D.   On: 06/01/2024 10:23     Procedures   Medications Ordered in the ED  furosemide  (LASIX ) injection 60 mg (has no administration in time range)  metolazone  (ZAROXOLYN ) tablet  2.5 mg (has no administration in time range)  enoxaparin  (LOVENOX ) injection 40 mg (has no administration in time range)  sodium chloride  flush (NS) 0.9 % injection 3 mL (has no administration in time range)  sodium chloride  flush (NS) 0.9 % injection 3 mL (has no administration in time range)  0.9 %  sodium chloride  infusion (has no administration in time range)  acetaminophen  (TYLENOL ) tablet 650 mg (has no administration in time range)    Or  acetaminophen  (TYLENOL ) suppository 650 mg (has no administration in time range)  ondansetron  (ZOFRAN ) tablet 4 mg (has no administration in time range)    Or  ondansetron  (ZOFRAN ) injection 4 mg (has no administration in time range)  insulin  glargine-yfgn (SEMGLEE ) injection 15 Units (has no administration in time range)  insulin  aspart (novoLOG ) injection 0-15 Units (has no administration in time range)  insulin  aspart (novoLOG ) injection 0-5 Units (has no administration in time range)  aspirin  EC tablet 81 mg (has no administration in time range)  citalopram  (CELEXA ) tablet 20 mg (has no administration in time range)  dapagliflozin  propanediol (FARXIGA ) tablet 5 mg (has no administration in time range)  pantoprazole  (PROTONIX ) EC tablet 40 mg (has no administration in time range)  gabapentin  (NEURONTIN ) capsule 500 mg (has no administration in time range)  metoprolol  succinate (TOPROL -XL) 24 hr tablet 150 mg (has no administration in time range)  potassium chloride  SA  (KLOR-CON  M) CR tablet 20 mEq (has no administration in time range)  spironolactone  (ALDACTONE ) tablet 25 mg (has no administration in time range)  insulin  aspart (novoLOG ) injection 5 Units (has no administration in time range)  Chlorhexidine  Gluconate Cloth 2 % PADS 6 each (has no administration in time range)  nitroGLYCERIN  (NITROGLYN) 2 % ointment 1 inch (1 inch Topical Given 06/01/24 1026)  furosemide  (LASIX ) injection 40 mg (40 mg Intravenous Given 06/01/24 1153)  iohexol  (OMNIPAQUE ) 350 MG/ML injection 75 mL (75 mLs Intravenous Contrast Given 06/01/24 1244)                                    Medical Decision Making Amount and/or Complexity of Data Reviewed Labs: ordered. Radiology: ordered.  Risk Prescription drug management. Decision regarding hospitalization.   This patient presents to the ED for concern of shortness of breath, this involves an extensive number of treatment options, and is a complaint that carries with it a high risk of complications and morbidity.  The differential diagnosis includes CHF exacerbation, pneumonia, PE, pleural effusion   Co morbidities / Chronic conditions that complicate the patient evaluation  HTN, GERD, UC, atrial fibrillation, DM, anemia, cirrhosis, CHF, OSA, fibromyalgia, neuropathy   Additional history obtained:  Additional history obtained from EMR External records from outside source obtained and reviewed including N/A   Lab Tests:  I Ordered, and personally interpreted labs.  The pertinent results include: Creatinine is mildly increased from baseline, electrolytes are normal, mild leukocytosis is present, BNP is elevated with normal troponin.   Imaging Studies ordered:  I ordered imaging studies including chest x-ray, CTA chest I independently visualized and interpreted imaging which showed pulmonary edema consistent with CHF exacerbation I agree with the radiologist interpretation   Cardiac Monitoring: / EKG:  The patient  was maintained on a cardiac monitor.  I personally viewed and interpreted the cardiac monitored which showed an underlying rhythm of: Sinus rhythm   Problem List / ED Course / Critical interventions / Medication  management  Patient presenting for worsening shortness of breath overnight and this morning.  On arrival in the ED, patient is ill-appearing.  She is pale and cyanotic.  She has tachypnea with increased work of breathing.  On lung auscultation, she is only able to take shallow breaths with decreased breath sounds throughout lung fields.  She was placed on monitor.  SpO2 on room air was 66%.  She was placed on a nonrebreather.  BiPAP was ordered to help with work of breathing.  Workup was initiated.  Per chart review, she had increase in her metoprolol  dose 4 days ago.  She was prescribed 5 days of Bactrim  1 week ago.  Patient's lab work notable for mild increase in creatinine from baseline and elevated BNP.  She underwent x-ray and CTA of chest which did not show any focal areas of pneumonia.  She does have pulmonary edema consistent with CHF exacerbation.  She was given initial dose of IV Lasix  while in the ED.  She continued to tolerate BiPAP well.  Currently, she is on 60% FiO2 with SpO2 of 96%.  Patient was admitted for further management. I ordered medication including NTG for HTN; Lasix  for diuresis Reevaluation of the patient after these medicines showed that the patient improved I have reviewed the patients home medicines and have made adjustments as needed  Social Determinants of Health:  Lives at home with family  CRITICAL CARE Performed by: Bernardino Fireman   Total critical care time: 34 minutes  Critical care time was exclusive of separately billable procedures and treating other patients.  Critical care was necessary to treat or prevent imminent or life-threatening deterioration.  Critical care was time spent personally by me on the following activities: development of  treatment plan with patient and/or surrogate as well as nursing, discussions with consultants, evaluation of patient's response to treatment, examination of patient, obtaining history from patient or surrogate, ordering and performing treatments and interventions, ordering and review of laboratory studies, ordering and review of radiographic studies, pulse oximetry and re-evaluation of patient's condition.      Final diagnoses:  Acute respiratory failure with hypoxia (HCC)  Acute on chronic congestive heart failure, unspecified heart failure type [facility])    ED Discharge Orders     None          Fireman Bernardino, MD 06/01/24 [id number]

## 2024-06-01 NOTE — H&P (Signed)
 History and Physical    Patient: [name] FMW:[id number] DOB: 07-Sep-1946 DOA: 06/01/2024 DOS: the patient was seen and examined on 06/01/2024 PCP: Candise Aleene DEL, MD   Patient coming from: Home  Chief Complaint:  Chief Complaint  Patient presents with   Shortness of Breath   HPI: [name] is a 78 y.o. female with medical history significant of morbid obesity, fibromyalgia, gastroesophageal reflux disease, hypertension, paroxysmal atrial fibrillation, hyperlipidemia, obstructive sleep apnea and NASH cirrhosis; who presented to the hospital secondary to increased shortness of breath and and complaints of orthopnea.  Patient reports symptom has been present for the last 2 days now and worsening.  Patient expressed dyspnea on exertion and have noticed some weight gain.  Patient reported no chest pain, palpitations, sick contacts, fever/chills, abdominal pain, dysuria, hematuria, melena, focal weaknesses or any other complaints.  Patient reported good medication compliance.  Workup in the ED demonstrating no pulmonary embolism and no presence of infiltrates; vascular congestion and suggestive findings for interstitial edema. Elevated BNP seen and due to increase resp distress and hypoxia required the use of BIPAP.   Patient is stabilized after IV Lasix  initiated; [facility] contacted to bring patient to the hospital for further evaluation and management.   Review of Systems: As mentioned in the history of present illness. All other systems reviewed and are negative.  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 ? to get liver bx if she gets bariatric surgery? Mild portal hypertensive gastropathy on EGD 08/2019.   Fibromyalgia    GAD (generalized anxiety disorder)    GERD    History of hiatal  hernia    History of iron  deficiency anemia 12/2018   Inadequate absorption secondary to chronic/long term PPI therapy + portal hypertensive gastroduodenopathy. No GIB found on EGD, colonosc, and givens. Iron  infusions X multiple.   History of thrombocytopenia 12/2011   Hyperlipidemia    Intolerant of statins   HYPERTENSION    IBS (irritable bowel syndrome)    -D.  Good response to bentyl  and imodium  as of 06/2018 GI f/u.   IDDM (insulin  dependent diabetes mellitus)    with DPN (managed by Dr. Trixie but then in 2018 pt preferred to have me manage for her convenience)   Limited mobility    Requires a walker for arthritic pain, widespread musculoskeletal pain, and neuropathic pain.   Morbid obesity (HCC)    As of 11/2018, pt considering sleeve gastectomy vs bipass as of eval by Dr. Gael considering as of 12/2019.   Nonalcoholic steatohepatitis    Viral Hep screens NEG.  CT 2015.  Transaminasemia.  U/S 07/2018 showed early changes of cirrhosis.   OSA (obstructive sleep apnea) 09/14/2015   sleep study 09/07/15: severe obstructive sleep apnea with an AHI of 72 and SaO2 low of 75%.>referred to sleep MD   Osteoarthritis    hips, shoulders, knees   PAF (paroxysmal atrial fibrillation) (HCC)    One documented episode (after getting EGD 2016).  Was on amiodarone  x 3 mo.  Rate control with metoprolol  + anticoag with xarelto . Watchman 12/2022, off anticoag   Portal hypertensive gastropathy (HCC)    hemorrhagic gastropathy + non-bleeding gastric ulcers on EGD 08/2023   Recurrent epistaxis    Granuloma in L nare cauterized by ENT 04/2020. Another cautery 06/2020  Small fiber neuropathy    Due to DM.  Symmetric hands and feet tingling/numbness.   Ulcerative colitis (HCC)    Remicade  infusion Q 8 weeks: in clinical and endoscopic remission as of 12/2018 GI f/u.  06/11/19 rpt colonoscopy->cecal and ascending colon colitis.   Past Surgical History:  Procedure Laterality Date   ABDOMINAL  HYSTERECTOMY  1980   Paps no longer indicated.   BACK SURGERY     BACTERIAL OVERGROWTH TEST N/A 07/13/2015   Procedure: BACTERIAL OVERGROWTH TEST;  Surgeon: Claudis RAYMOND Rivet, MD;  [facility];  Service: Endoscopy;  Laterality: N/A;  730     BILATERAL SALPINGOOPHORECTOMY  02/10/2001   BIOPSY  06/11/2019   Procedure: BIOPSY;  Surgeon: Rivet Claudis RAYMOND, MD;  [facility]: [facility];  Service: Endoscopy;;  colon   BIOPSY  09/27/2019   Procedure: BIOPSY;  Surgeon: Rivet Claudis RAYMOND, MD;  [facility];  Service: Endoscopy;;  gastric duodenal   BIOPSY  09/15/2020   Procedure: BIOPSY;  Surgeon: Eartha Angelia Sieving, MD;  [facility];  Service: Gastroenterology;;   BIOPSY  09/25/2023   Procedure: BIOPSY;  Surgeon: Eartha Angelia Sieving, MD;  [facility];  Service: Gastroenterology;;   BREAST REDUCTION SURGERY  1994   bilat   BREAST SURGERY     CARDIOVASCULAR STRESS TEST  07/2010   Lexiscan myoview: normal   CARPAL TUNNEL RELEASE Right 1996   CARPAL TUNNEL RELEASE Left 03/21/2003   CARPAL TUNNEL RELEASE Right 05/04/2013   Procedure: CARPAL TUNNEL RELEASE;  Surgeon: Lamar LULLA Leonor Mickey., MD;  Location: [facility];  Service: Orthopedics;  Laterality: Right;   CARPAL TUNNEL RELEASE Left 09/21/2013   Procedure: LEFT CARPAL TUNNEL RELEASE;  Surgeon: Lamar LULLA Leonor Mickey., MD;  Location: [facility];  Service: Orthopedics;  Laterality: Left;   CHOLECYSTECTOMY     COLONOSCOPY WITH PROPOFOL  N/A 08/04/2015   Colitis in remission.  No polyps.  Procedure: COLONOSCOPY WITH PROPOFOL ;  Surgeon: Claudis RAYMOND Rivet, MD;  [facility]: [facility];  Service: Endoscopy;  Laterality: N/A;  cecum time in  0820   time out  0827    total time 7 minutes   COLONOSCOPY WITH PROPOFOL  N/A 06/11/2019   cecal and ascending colon colitis.  Procedure: COLONOSCOPY WITH PROPOFOL ;  Surgeon: Rivet Claudis RAYMOND, MD;  [facility];  Service: Endoscopy;   Laterality: N/A;  730a   COLONOSCOPY WITH PROPOFOL  N/A 09/15/2020   Procedure: COLONOSCOPY WITH PROPOFOL ;  Surgeon: Eartha Angelia Sieving, MD;  [facility];  Service: Gastroenterology;  Laterality: N/A;  1030   ESOPHAGEAL DILATION N/A 08/04/2015   Procedure: ESOPHAGEAL DILATION;  Surgeon: Claudis RAYMOND Rivet, MD;  [facility]: [facility];  Service: Endoscopy;  Laterality: N/AMERL Agapito MOH, no mucousal disruption   ESOPHAGOGASTRODUODENOSCOPY  09/27/2019   Performed for IDA.  Esoph dilation was done but no stricture present.  Mild portal hypertensive gastropathy, o/w normal.  Duodenal bx NEG.  h pylori neg.   ESOPHAGOGASTRODUODENOSCOPY     09/25/2023 inactive chronic gastritis, h pylor neg. benign   ESOPHAGOGASTRODUODENOSCOPY     09/25/23 hemorrhagic portal gastropathy + nonbleeding gastric ulcers (H pylori neg)   ESOPHAGOGASTRODUODENOSCOPY (EGD) WITH ESOPHAGEAL DILATION  12/02/2005   ESOPHAGOGASTRODUODENOSCOPY (EGD) WITH PROPOFOL  N/A 08/04/2015   Procedure: ESOPHAGOGASTRODUODENOSCOPY (EGD) WITH PROPOFOL ;  Surgeon: Claudis RAYMOND Rivet, MD;  Location: [facility];  Service: Endoscopy;  Laterality: N/A;  procedure 1   ESOPHAGOGASTRODUODENOSCOPY (EGD) WITH PROPOFOL  N/A 09/27/2019  Procedure: ESOPHAGOGASTRODUODENOSCOPY (EGD) WITH PROPOFOL ;  Surgeon: Golda Claudis PENNER, MD;  [facility];  Service: Endoscopy;  Laterality: N/A;  12:10   ESOPHAGOGASTRODUODENOSCOPY (EGD) WITH PROPOFOL  N/A 10/31/2021   Procedure: ESOPHAGOGASTRODUODENOSCOPY (EGD) WITH PROPOFOL ;  Surgeon: Golda Claudis PENNER, MD;  [facility];  Service: Endoscopy;  Laterality: N/A;  930   ESOPHAGOGASTRODUODENOSCOPY (EGD) WITH PROPOFOL  N/A 09/25/2023   Procedure: ESOPHAGOGASTRODUODENOSCOPY (EGD) WITH PROPOFOL ;  Surgeon: Eartha Angelia Sieving, MD;  [facility];  Service: Gastroenterology;  Laterality: N/A;  10:30AM;ASA 3   FLEXIBLE SIGMOIDOSCOPY  01/17/2012   Procedure: FLEXIBLE SIGMOIDOSCOPY;  Surgeon: Claudis PENNER Golda, MD;  [facility];  Service: Endoscopy;  Laterality: N/A;   GIVENS CAPSULE STUDY N/A 08/03/2019   Procedure: GIVENS CAPSULE STUDY (performed for IDA)->some food debris in stomach and small amount of blood.  Surgeon: Golda Claudis PENNER, MD;  [facility];  Service: Endoscopy;  Laterality: N/A;  730AM   HEMILAMINOTOMY LUMBAR SPINE Bilateral 09/07/1999   L4-5   HOT HEMOSTASIS  09/25/2023   Procedure: HOT HEMOSTASIS (ARGON PLASMA COAGULATION/BICAP);  Surgeon: Eartha Angelia, Sieving, MD;  [facility];  Service: Gastroenterology;;   KNEE ARTHROSCOPY Right 01/1999; 10/2000   LEFT ATRIAL APPENDAGE OCCLUSION N/A 01/23/2023   Procedure: LEFT ATRIAL APPENDAGE OCCLUSION;  Surgeon: Wonda Sharper, MD;  Location: [facility] CV [facility];  Service: Cardiovascular;  Laterality: N/A;   LEFT ATRIAL APPENDAGE OCCLUSION     LYSIS OF ADHESION  02/10/2001   MALONEY DILATION  09/27/2019   Procedure: MALONEY DILATION;  Surgeon: Golda Claudis PENNER, MD;  [facility];  Service: Endoscopy;;   NASAL ENDOSCOPY WITH EPISTAXIS CONTROL Bilateral 01/25/2022   Procedure: NASAL ENDOSCOPY WITH EPISTAXIS CONTROL;  Surgeon: Mable Lenis, MD;  Location: [facility];  Service: ENT;  Laterality: Bilateral;   NASAL SEPTOPLASTY W/ TURBINOPLASTY Bilateral 01/25/2022   Procedure: NASAL SEPTOPLASTY WITH TURBINATE REDUCTION;  Surgeon: Mable Lenis, MD;  Location: [facility] OR;  Service: ENT;  Laterality: Bilateral;   RECTOCELE REPAIR  1990; 09/12/2006   TARSAL TUNNEL RELEASE  2002   TEE WITHOUT CARDIOVERSION N/A 01/23/2023   Procedure: TRANSESOPHAGEAL ECHOCARDIOGRAM;  Surgeon: Wonda Sharper, MD;  Location: [facility] INVASIVE CV LAB;  Service: Cardiovascular;  Laterality: N/A;   TRANSESOPHAGEAL ECHOCARDIOGRAM (CATH LAB) N/A 08/26/2023   4mm leak around watchman device. Procedure: TRANSESOPHAGEAL ECHOCARDIOGRAM;  Surgeon: Loni Soyla LABOR, MD;  Location: [facility] INVASIVE CV LAB;  Service: Cardiovascular;   Laterality: N/A;   TRANSTHORACIC ECHOCARDIOGRAM  08/04/2015   EF 60-65%, normal wall motion, mild LVH, mild LA dilation, grd I DD.  11/2023 overall poor quality, EF 55-60%   TUMOR EXCISION Left 03/21/2003   dorsal 1st web space (hand)   URETEROLYSIS Right 02/10/2001   Social History:  reports that she has never smoked. She has never used smokeless tobacco. She reports that she does not currently use alcohol. She reports that she does not use drugs.  Allergies  Allergen Reactions   Flagyl  [Metronidazole  Hcl] Other (See Comments)    Diaphoresis    Omeprazole  Anaphylaxis and Swelling    Tongue and throat swelling   Pioglitazone Other (See Comments) and Swelling    Weight gain, tongue swelling   Allevyn Adhesive [Wound Dressings] Other (See Comments)    Unknown   Benzocaine-Menthol Swelling    Mouth swelling   Lactose Diarrhea and Other (See Comments)    Gas, bloating   Lactose Intolerance (Gi) Diarrhea    Gas, bloating  Metformin  And Related Diarrhea   Shrimp [Shellfish Allergy] Itching    Throat and ear itching, if consumed raw   Statins Palpitations   Welchol [Colesevelam] Other (See Comments)    GI upset   Adhesive [Tape] Other (See Comments)    Skin irritation and bruising    Desipramine  Hcl Itching, Nausea Only and Other (See Comments)    Swimmy headed   Hydromorphone  Itching   Jardiance  [Empagliflozin ] Other (See Comments)    Weakness    Nisoldipine Itching   Percocet [Oxycodone -Acetaminophen ] Itching    Family History  Problem Relation Age of Onset   Diabetes Mother    Hypertension Mother    Heart attack Father        Mid 25's   Heart disease Father    Lung disease Father        spot on lung; had lung surgery   Alcohol abuse Other    Hypertension Son    Diabetes Son    Emphysema Maternal Grandfather    Asthma Maternal Grandfather    Colon cancer Paternal Grandfather    Stomach cancer Paternal Grandmother    Neuropathy Neg Hx     Prior to Admission  medications   Medication Sig Start Date End Date Taking? Authorizing Provider  Accu-Chek Softclix Lancets lancets USE TO CHECK BLOOD GLUCOSE UP TO 6 TIMES DAILY AS DIRECTED 02/13/23   McGowen, Aleene DEL, MD  acetaminophen  (TYLENOL ) 500 MG tablet Take 1,000 mg by mouth every 6 (six) hours as needed for mild pain (pain score 1-3).    [provider]  albuterol  (VENTOLIN  HFA) 108 (90 Base) MCG/ACT inhaler Inhale 2 puffs into the lungs every 6 (six) hours as needed for wheezing or shortness of breath. 11/17/23   McGowen, Aleene DEL, MD  aspirin  EC 81 MG tablet Take 1 tablet (81 mg total) by mouth daily. Swallow whole. 07/23/23   McDaniel, Jill D, NP  blood glucose meter kit and supplies KIT Use up to six times daily as directed. DX. E11.9 01/05/20   McGowen, Aleene DEL, MD  citalopram  (CELEXA ) 20 MG tablet Take 1 tablet (20 mg total) by mouth daily. 05/03/24   McGowen, Aleene DEL, MD  Continuous Glucose Receiver (FREESTYLE LIBRE 3 READER) DEVI Use as directed to check blood sugars 01/23/24   Crain, Whitney L, PA  Continuous Glucose Sensor (FREESTYLE LIBRE 3 PLUS SENSOR) MISC Change sensor every 15 days. 05/03/24   McGowen, Aleene DEL, MD  Continuous Glucose Sensor (FREESTYLE LIBRE 3 SENSOR) MISC 1 each by Does not apply route every 14 (fourteen) days. Please apply for 14 days and then switch to new sensor 01/23/24   Crain, Whitney L, PA  dapagliflozin  propanediol (FARXIGA ) 5 MG TABS tablet Take 1 tablet (5 mg total) by mouth daily. 02/27/24   Crain, Whitney L, PA  diphenoxylate -atropine  (LOMOTIL ) 2.5-0.025 MG tablet Take 1 tablet by mouth 4 (four) times daily as needed for diarrhea or loose stools. 12/10/22   Eartha Angelia Sieving, MD  Dulaglutide  (TRULICITY ) 1.5 MG/0.5ML SOAJ INJECT 1.5 MG (0.5ML) UNDER THE SKIN ONCE A WEEK 05/03/24   McGowen, Aleene DEL, MD  esomeprazole  (NEXIUM ) 40 MG capsule Take 1 capsule (40 mg total) by mouth daily before breakfast. 09/25/23   Eartha Angelia, Sieving, MD  furosemide  (LASIX )  20 MG tablet 80 mg qAM and 60 mg qPM 05/03/24   McGowen, Aleene DEL, MD  gabapentin  (NEURONTIN ) 600 MG tablet TAKE 1 AND 1/2 TABLETS BY MOUTH TWICE A DAY Patient taking differently: Take  500 mg by mouth daily at 6 (six) AM. 09/30/23   McGowen, Aleene DEL, MD  glucose blood (ACCU-CHEK GUIDE TEST) test strip USE TO CHECK BLOOD GLUCOSE UP TO 6 TIMES DAILY. 12/08/23   McGowen, Aleene DEL, MD  Guselkumab  (TREMFYA  PEN) 200 MG/2ML SOAJ Inject 200 mg into the skin every 28 (twenty-eight) days. Starting 4 weeks after the last Tremfya  infusion which will be around June 10, 2024 07/08/24   Eartha Flavors, Toribio, MD  HYDROcodone -acetaminophen  (NORCO/VICODIN) 5-325 MG tablet 1 tab po bid prn pain 12/04/23   McGowen, Aleene DEL, MD  insulin  lispro (HUMALOG  KWIKPEN) 100 UNIT/ML KwikPen 115 units with breakfast, 108 units with lunch, and 65 U at supper 05/26/24   McGowen, Aleene DEL, MD  Insulin  Pen Needle (B-D UF III MINI PEN NEEDLES) 31G X 5 MM MISC USE TO INJECT INSULINS EQUAL TO 6 TIMES DAILY. 05/03/24   McGowen, Aleene DEL, MD  lactase (LACTAID) 3000 UNITS tablet Take 3,000 Units by mouth as needed (when eating foods containing dairy).     [provider]  meclizine  (ANTIVERT ) 25 MG tablet Take 1 tablet (25 mg total) by mouth 3 (three) times daily as needed for dizziness or nausea. 11/14/23   Emelia Josefa HERO, NP  metoprolol  succinate (TOPROL -XL) 100 MG 24 hr tablet TAKE 1.5 (one and one half tablet) TABLET BY MOUTH 2 TIMES DAILY. TAKE WITH OR IMMEDIATELY FOLLOWING A MEAL 05/28/24   Emelia Josefa HERO, NP  mirabegron  ER (MYRBETRIQ ) 25 MG TB24 tablet Take 1 tablet (25 mg total) by mouth daily. 05/26/24   McGowen, Aleene DEL, MD  potassium chloride  (KLOR-CON  M) 10 MEQ tablet TAKE 1 TABLET BY MOUTH EVERY DAY 02/26/24   Crain, Whitney L, PA  spironolactone  (ALDACTONE ) 25 MG tablet Take 1 tablet (25 mg total) by mouth daily. 05/03/24   McGowen, Aleene DEL, MD  bromocriptine (PARLODEL) 2.5 MG tablet Take 2.5 mg by mouth 2 (two) times  daily.  12/05/11  [provider]    Physical Exam: Vitals:   06/01/24 1400 06/01/24 1415 06/01/24 1430 06/01/24 1445  BP: (!) 125/55 125/63 (!) 114/52 (!) 120/50  Pulse: (!) 102 88 98 86  Resp: (!) 26 (!) 21 (!) 23 (!) 22  Temp:      TempSrc:      SpO2: 98% 98% 98% 97%  Weight:      Height:       General exam: Alert, awake, oriented x 3; reports feeling better.  Limited exam with BiPAP in place.  No fever Respiratory system: Decreased breath sounds at the bases; no wheezing, positive tachypnea.  Fine crackles and rhonchi appreciated on exam. Cardiovascular system: Rate controlled, no rubs, no gallops, unable to assess JVD with body habitus. Gastrointestinal system: Abdomen is obese, nondistended, soft and nontender.  Positive bowel sounds. Central nervous system: No focal neurological deficits. Extremities: No cyanosis or clubbing; 2-3+ edema appreciated bilaterally (summary changes are chronic, slightly associated with obesity). Skin: No petechiae. Psychiatry: Judgement and insight appear normal. Mood & affect appropriate.    Data Reviewed: BNP : 448 Respiratory panel by PCR: Negative for COVID, influenza and RSV Comprehensive metabolic panel: Sodium 136, potassium 4.6, chloride 99, bicarb 24, glucose 288, BUN 16, creatinine 1.28, normal LFTs and GFR 43 CBC: WBC 12.0, hemoglobin 12.0, platelet count 161K Magnesium : 1.8  Assessment and Plan: 1-acute respiratory failure with hypoxia - Appears to be secondary to acute on chronic diastolic heart failure - Recent echo in February 2025 demonstrating EF 55-60%, no  wall motion abnormality. -IV Lasix  and metolazone  has been started - Follow daily weights, strict I's and O's and low-sodium diet - Wean off oxygen supplementation as tolerated - Follow clinical response. - Continue metoprolol , Farxiga  and Aldactone  as per home regimen.  2-morbid obesity/OSA -Body mass index is 41.13 kg/m.  - Low caloric diet, portion control  and increase physical activity discussed with patient - BiPAP/CPAP nightly has been ordered.  3-chronic atrial fibrillation - Continue Toprol  for rate control - No chronic anticoagulation secondary to history of epistaxis - Continue aspirin .  4-GERD - Continue PPI.  5-type 2 diabetes mellitus - Continue sliding scale insulin  and Semglee  - Follow CBG fluctuation and adjust hypoglycemic regimen as needed - Modified carbohydrate diet discussed with patient - Recent A1c 6.8  6-hypertension - Continue to follow vital sign - Will continue home antihypertensive agents and IV diuresis at the moment.  7-diabetic neuropathy - Continue treatment with Neurontin   8-depression/anxiety - Continue escitalopram - Stable mood on exam.   Advance Care Planning:   Code Status: Full Code   Consults: None  Family Communication: No family at bedside.  Severity of Illness: The appropriate patient status for this patient is INPATIENT. Inpatient status is judged to be reasonable and necessary in order to provide the required intensity of service to ensure the patient's safety. The patient's presenting symptoms, physical exam findings, and initial radiographic and laboratory data in the context of their chronic comorbidities is felt to place them at high risk for further clinical deterioration. Furthermore, it is not anticipated that the patient will be medically stable for discharge from the hospital within 2 midnights of admission.   * I certify that at the point of admission it is my clinical judgment that the patient will require inpatient hospital care spanning beyond 2 midnights from the point of admission due to high intensity of service, high risk for further deterioration and high frequency of surveillance required.*  Author: Eric Nunnery, MD 06/01/2024 3:07 PM  For on call review [email or web address].

## 2024-06-02 DIAGNOSIS — J9601 Acute respiratory failure with hypoxia: Secondary | ICD-10-CM | POA: Diagnosis not present

## 2024-06-02 LAB — CBC
HCT: 33.1 % — ABNORMAL LOW (ref 36.0–46.0)
Hemoglobin: 10.9 g/dL — ABNORMAL LOW (ref 12.0–15.0)
MCH: 31.1 pg (ref 26.0–34.0)
MCHC: 32.9 g/dL (ref 30.0–36.0)
MCV: 94.6 fL (ref 80.0–100.0)
Platelets: 93 K/uL — ABNORMAL LOW (ref 150–400)
RBC: 3.5 MIL/uL — ABNORMAL LOW (ref 3.87–5.11)
RDW: 17.1 % — ABNORMAL HIGH (ref 11.5–15.5)
WBC: 4.9 K/uL (ref 4.0–10.5)
nRBC: 0 % (ref 0.0–0.2)

## 2024-06-02 LAB — BRAIN NATRIURETIC PEPTIDE: B Natriuretic Peptide: 613 pg/mL — ABNORMAL HIGH (ref 0.0–100.0)

## 2024-06-02 LAB — BASIC METABOLIC PANEL WITH GFR
Anion gap: 10 (ref 5–15)
BUN: 18 mg/dL (ref 8–23)
CO2: 32 mmol/L (ref 22–32)
Calcium: 8.9 mg/dL (ref 8.9–10.3)
Chloride: 95 mmol/L — ABNORMAL LOW (ref 98–111)
Creatinine, Ser: 1.13 mg/dL — ABNORMAL HIGH (ref 0.44–1.00)
GFR, Estimated: 50 mL/min — ABNORMAL LOW (ref >60)
Glucose, Bld: 119 mg/dL — ABNORMAL HIGH (ref 70–99)
Potassium: 3.3 mmol/L — ABNORMAL LOW (ref 3.5–5.1)
Sodium: 137 mmol/L (ref 135–145)

## 2024-06-02 LAB — GLUCOSE, CAPILLARY
Glucose-Capillary: 121 mg/dL — ABNORMAL HIGH (ref 70–99)
Glucose-Capillary: 178 mg/dL — ABNORMAL HIGH (ref 70–99)
Glucose-Capillary: 200 mg/dL — ABNORMAL HIGH (ref 70–99)
Glucose-Capillary: 275 mg/dL — ABNORMAL HIGH (ref 70–99)

## 2024-06-02 MED ORDER — POTASSIUM CHLORIDE CRYS ER 20 MEQ PO TBCR
40.0000 meq | EXTENDED_RELEASE_TABLET | Freq: Every day | ORAL | Status: DC
  Administered 2024-06-02: 40 meq via ORAL
  Filled 2024-06-02: qty 2

## 2024-06-02 NOTE — Progress Notes (Addendum)
   06/02/24 0911  TOC Brief Assessment  Insurance and Status Reviewed  Patient has primary care physician Yes  Home environment has been reviewed Home with spouse  Prior level of function: Independent  Prior/Current Home Services No current home services  Social Drivers of Health Review SDOH reviewed no interventions necessary  Readmission risk has been reviewed Yes  Transition of care needs no transition of care needs at this time   Patient has cardiologist, CHF education added to AVS for patient to review.   Transition of Care Department [facility]) has reviewed patient and no TOC needs have been identified at this time. We will continue to monitor patient advancement through interdisciplinary progression rounds. If new patient transition needs arise, please place a TOC consult.

## 2024-06-02 NOTE — Plan of Care (Signed)
  Problem: Acute Rehab PT Goals(only PT should resolve) Goal: Pt Will Go Supine/Side To Sit Outcome: Progressing Flowsheets (Taken 06/02/2024 1144) Pt will go Supine/Side to Sit:  with supervision  with modified independence Goal: Patient Will Transfer Sit To/From Stand Outcome: Progressing Flowsheets (Taken 06/02/2024 1144) Patient will transfer sit to/from stand:  with modified independence  with supervision Goal: Pt Will Transfer Bed To Chair/Chair To Bed Outcome: Progressing Flowsheets (Taken 06/02/2024 1144) Pt will Transfer Bed to Chair/Chair to Bed:  with modified independence  with supervision Goal: Pt Will Ambulate Outcome: Progressing Flowsheets (Taken 06/02/2024 1144) Pt will Ambulate:  100 feet  with supervision  with rolling walker  with modified independence   11:45 AM, 06/02/24 Lynwood Music, MPT Physical Therapist with [facility] [phone] office [phone] mobile phone

## 2024-06-02 NOTE — Evaluation (Signed)
 Physical Therapy Evaluation Patient Details Name: [name] MRN: [id number] DOB: 02/13/1946 Today's Date: 06/02/2024  History of Present Illness  [name] is a 78 y.o. female with medical history significant of morbid obesity, fibromyalgia, gastroesophageal reflux disease, hypertension, paroxysmal atrial fibrillation, hyperlipidemia, obstructive sleep apnea and NASH cirrhosis; who presented to the hospital secondary to increased shortness of breath and and complaints of orthopnea.  Patient reports symptom has been present for the last 2 days now and worsening.  Patient expressed dyspnea on exertion and have noticed some weight gain.  Patient reported no chest pain, palpitations, sick contacts, fever/chills, abdominal pain, dysuria, hematuria, melena, focal weaknesses or any other complaints.   Clinical Impression  Patient demonstrates slow labored movement for sitting up at bedside requiring increased time for scooting to EOB, had to lean on armrest of chair during transfers without AD, safer using RW with good return demonstrated for ambulating in room, hallway without loss of balance. Patient able to transfer to/from commode and tolerated sitting up in chair after therapy. Patient will benefit from continued skilled physical therapy in hospital and recommended venue below to increase strength, balance, endurance for safe ADLs and gait.          If plan is discharge home, recommend the following: A little help with walking and/or transfers;A little help with bathing/dressing/bathroom;Help with stairs or ramp for entrance;Assistance with cooking/housework;Assist for transportation   Can travel by private vehicle        Equipment Recommendations None recommended by PT  Recommendations for Other Services       Functional Status Assessment Patient has had a recent decline in their functional status and demonstrates the ability to make significant improvements in function in a reasonable  and predictable amount of time.     Precautions / Restrictions Precautions Precautions: Fall Recall of Precautions/Restrictions: Intact Restrictions Weight Bearing Restrictions Per Provider Order: No      Mobility  Bed Mobility Overal bed mobility: Needs Assistance Bed Mobility: Supine to Sit     Supine to sit: Min assist, HOB elevated     General bed mobility comments: increased time with frequent rest breaks    Transfers Overall transfer level: Needs assistance   Transfers: Sit to/from Stand, Bed to chair/wheelchair/BSC Sit to Stand: Contact guard assist   Step pivot transfers: Contact guard assist, Min assist       General transfer comment: has to lean on armrest of chair without AD, safer using RW    Ambulation/Gait Ambulation/Gait assistance: Contact guard assist, Supervision Gait Distance (Feet): 80 Feet Assistive device: Rolling walker (2 wheels) Gait Pattern/deviations: Decreased step length - right, Decreased step length - left, Decreased stride length Gait velocity: decreased     General Gait Details: slightly labored movement with fair/good return for ambulating in room, hallway without loss of balance using RW, on room air with SpO2 above 93%  Stairs            Wheelchair Mobility     Tilt Bed    Modified Rankin (Stroke Patients Only)       Balance Overall balance assessment: Needs assistance Sitting-balance support: Feet supported, No upper extremity supported Sitting balance-Leahy Scale: Fair Sitting balance - Comments: seated at EOB   Standing balance support: During functional activity, No upper extremity supported Standing balance-Leahy Scale: Poor Standing balance comment: fair/poor without AD, fair using RW  Pertinent Vitals/Pain Pain Assessment Pain Assessment: No/denies pain    Home Living Family/patient expects to be discharged to:: Private residence Living Arrangements:  Spouse/significant other Available Help at Discharge: Family;Available 24 hours/day Type of Home: House Home Access: Stairs to enter Entrance Stairs-Rails: Right Entrance Stairs-Number of Steps: 1   Home Layout: One level;Laundry or work area in basement;Able to live on main level with bedroom/bathroom Home Equipment: Agricultural consultant (2 wheels);Rollator (4 wheels);Cane - quad;Shower seat;Grab bars - tub/shower;Hand held shower head      Prior Function Prior Level of Function : Needs assist       Physical Assist : ADLs (physical);Mobility (physical) Mobility (physical): Bed mobility;Transfers;Gait;Stairs ADLs (physical): Dressing;IADLs Mobility Comments: Pt reprots community ambulation with QC; pt leans on furniture when ambulating in the home. ADLs Comments: Pt reports assist for shower transer and set up assist for dressing only. Spouse also assist with IADL's.     Extremity/Trunk Assessment   Upper Extremity Assessment Upper Extremity Assessment: Defer to OT evaluation    Lower Extremity Assessment Lower Extremity Assessment: Generalized weakness    Cervical / Trunk Assessment Cervical / Trunk Assessment: Kyphotic  Communication   Communication Communication: No apparent difficulties    Cognition Arousal: Alert Behavior During Therapy: WFL for tasks assessed/performed   PT - Cognitive impairments: No apparent impairments                         Following commands: Intact       Cueing Cueing Techniques: Verbal cues, Tactile cues     General Comments      Exercises     Assessment/Plan    PT Assessment Patient needs continued PT services  PT Problem List Decreased strength;Decreased activity tolerance;Decreased balance;Decreased mobility       PT Treatment Interventions DME instruction;Gait training;Stair training;Functional mobility training;Therapeutic activities;Therapeutic exercise;Balance training;Patient/family education    PT Goals  (Current goals can be found in the Care Plan section)  Acute Rehab PT Goals Patient Stated Goal: return home with family to assist PT Goal Formulation: With patient Time For Goal Achievement: 06/07/24 Potential to Achieve Goals: Good    Frequency Min 3X/week     Co-evaluation PT/OT/SLP Co-Evaluation/Treatment: Yes Reason for Co-Treatment: To address functional/ADL transfers PT goals addressed during session: Mobility/safety with mobility;Balance;Proper use of DME OT goals addressed during session: ADL's and self-care       AM-PAC PT 6 Clicks Mobility  Outcome Measure Help needed turning from your back to your side while in a flat bed without using bedrails?: A Little Help needed moving from lying on your back to sitting on the side of a flat bed without using bedrails?: A Little Help needed moving to and from a bed to a chair (including a wheelchair)?: A Little Help needed standing up from a chair using your arms (e.g., wheelchair or bedside chair)?: A Little Help needed to walk in hospital room?: A Little Help needed climbing 3-5 steps with a railing? : A Lot 6 Click Score: 17    End of Session   Activity Tolerance: Patient tolerated treatment well;Patient limited by fatigue Patient left: in chair;with call bell/phone within reach Nurse Communication: Mobility status PT Visit Diagnosis: Unsteadiness on feet (R26.81);Other abnormalities of gait and mobility (R26.89);Muscle weakness (generalized) (M62.81)    Time: 9091-9062 PT Time Calculation (min) (ACUTE ONLY): 29 min   Charges:   PT Evaluation $PT Eval Moderate Complexity: 1 Mod PT Treatments $Therapeutic Activity: 23-37 mins PT General Charges $$  ACUTE PT VISIT: 1 Visit         11:43 AM, 06/02/24 Lynwood Music, MPT Physical Therapist with [facility] [address] [phone] office [phone] mobile phone

## 2024-06-02 NOTE — Plan of Care (Signed)
  Problem: Acute Rehab OT Goals (only OT should resolve) Goal: Pt. Will Perform Grooming Flowsheets (Taken 06/02/2024 1040) Pt Will Perform Grooming:  with modified independence  standing Goal: Pt. Will Perform Lower Body Bathing Flowsheets (Taken 06/02/2024 1040) Pt Will Perform Lower Body Bathing:  with modified independence  sitting/lateral leans Goal: Pt. Will Perform Lower Body Dressing Flowsheets (Taken 06/02/2024 1040) Pt Will Perform Lower Body Dressing:  with modified independence  sitting/lateral leans Goal: Pt. Will Transfer To Toilet Flowsheets (Taken 06/02/2024 1040) Pt Will Transfer to Toilet:  with modified independence  ambulating Goal: Pt/Caregiver Will Perform Home Exercise Program Flowsheets (Taken 06/02/2024 1040) Pt/caregiver will Perform Home Exercise Program:  Increased strength  Both right and left upper extremity  Independently  Jabori Henegar OT, MOT

## 2024-06-02 NOTE — Progress Notes (Signed)
 Spoke with Pt and family about CPAP for tonight. At home pt only wears sometimes and would probably be ok without it tonight. Encouraged pt to call if she would like to try our CPAP tonight. RT will continue to monitor

## 2024-06-02 NOTE — Progress Notes (Signed)
 PROGRESS NOTE    Patient: [name]                            PCP: Candise Aleene DEL, MD                    DOB: 03/20/1946            DOA: 06/01/2024 FMW:[id number]             DOS: 06/02/2024, 12:03 PM   LOS: 1 day   Date of Service: The patient was seen and examined on 06/02/2024  Subjective:   The patient was seen and examined this morning. Hemodynamically stable. No issues overnight .  Brief Narrative:   [name] is a 78 y.o. female with medical history significant of morbid obesity, fibromyalgia, gastroesophageal reflux disease, hypertension, paroxysmal atrial fibrillation, hyperlipidemia, obstructive sleep apnea and NASH cirrhosis; who presented to the hospital secondary to increased shortness of breath and and complaints of orthopnea.  Patient reports symptom has been present for the last 2 days now and worsening.  Patient expressed dyspnea on exertion and have noticed some weight gain.  Patient reported no chest pain, palpitations, sick contacts, fever/chills, abdominal pain, dysuria, hematuria, melena, focal weaknesses or any other complaints.   Patient reported good medication compliance.   Workup in the ED demonstrating no pulmonary embolism and no presence of infiltrates; vascular congestion and suggestive findings for interstitial edema. Elevated BNP seen and due to increase resp distress and hypoxia required the use of BIPAP.    Patient is stabilized after IV Lasix  initiated; [facility] contacted to bring patient to the hospital for further evaluation and management.   Assessment & Plan:   Principal Problem:   Acute respiratory failure with hypoxia (HCC)     Assessment and Plan:  Acute respiratory failure with hypoxia - Remain on 3 L of oxygen, currently satting 92% -Likely due to chronic diastolic heart failure -Monitoring I's and O's, daily weight, continue diuretics  - Recent echo in February 2025 demonstrating EF 55-60%, no wall motion abnormality. -IV  Lasix  and metolazone  has been started - Follow daily weights, strict I's and O's and low-sodium diet - Wean off oxygen supplementation as tolerated - Follow clinical response. - Continue metoprolol , Farxiga  and Aldactone  as per home regimen.    Intake/Output Summary (Last 24 hours) at 06/02/2024 0710 Last data filed at 06/02/2024 0000 Gross per 24 hour  Intake 120 ml  Output 3700 ml  Net -3580 ml   Filed Weights   06/01/24 0931 06/01/24 1616 06/02/24 0500  Weight: 99.8 kg 102 kg 97.2 kg      Morbid obesity/OSA -Body mass index is 41.13 kg/m.  - Low caloric diet, portion control and increase physical activity discussed with patient - BiPAP/CPAP nightly has been ordered.   Chronic atrial fibrillation - Continue Toprol  for rate control  - Cont. Epistaxis and Asprin (?  Need for aspirin )   GERD - Continue PPI.   Type 2 diabetes mellitus - Checking CBG q. ACHS, SSI coverage -Holding home regimen - Recent A1c 6.8   Hypertension - Stable continue meds: Including metoprolol , Aldactone  -On Lasix  monitor BP closely   Hypokalemia  Replating orally  diabetic neuropathy - Continue  Neurontin    Depression/anxiety - Continue escitalopram - Mood stable   ------------------------------------------------------------------------------------------------------------------------ Nutritional status:  The patient's BMI is: Body mass index is 39.19 kg/m. I agree with the assessment and plan as  outlined      ------------------------------------------------------------------------------------------------------------------------------------------------  DVT prophylaxis:     Code Status:   Code Status: Full Code  Family Communication: No family member present at bedside-  -Advance care planning has been discussed.   Admission status:   Status is: Inpatient Remains inpatient appropriate because: needing IV diuretics   Disposition: From  - home             Planning for  discharge in 1-2 days   Procedures:   No admission procedures for hospital encounter.   Antimicrobials:  Anti-infectives (From admission, onward)    None        Medication:   aspirin  EC  81 mg Oral Daily   Chlorhexidine  Gluconate Cloth  6 each Topical Q0600   citalopram   20 mg Oral Daily   dapagliflozin  propanediol  5 mg Oral Daily   furosemide   60 mg Intravenous BID   gabapentin   500 mg Oral Q0600   insulin  aspart  0-15 Units Subcutaneous TID WC   insulin  aspart  0-5 Units Subcutaneous QHS   insulin  aspart  5 Units Subcutaneous TID WC   insulin  glargine-yfgn  15 Units Subcutaneous BID   metolazone   2.5 mg Oral Daily   metoprolol  succinate  150 mg Oral BID   pantoprazole   40 mg Oral Daily   potassium chloride   40 mEq Oral Daily   sodium chloride  flush  3 mL Intravenous Q12H   spironolactone   25 mg Oral Daily    sodium chloride , acetaminophen  **OR** acetaminophen , ondansetron  **OR** ondansetron  (ZOFRAN ) IV, sodium chloride  flush   Objective:   Vitals:   06/01/24 2300 06/02/24 0000 06/02/24 0500 06/02/24 0806  BP: 121/63 110/83 (!) 123/49 (!) 135/57  Pulse: 97 (!) 106 72 91  Resp: (!) 25 (!) 24 (!) 25 (!) 23  Temp:  98 F (36.7 C) 98.2 F (36.8 C)   TempSrc:  Axillary Axillary   SpO2: 98% 98% 98% 98%  Weight:   97.2 kg   Height:        Intake/Output Summary (Last 24 hours) at 06/02/2024 1203 Last data filed at 06/02/2024 1154 Gross per 24 hour  Intake 360 ml  Output 4125 ml  Net -3765 ml   Filed Weights   06/01/24 0931 06/01/24 1616 06/02/24 0500  Weight: 99.8 kg 102 kg 97.2 kg     Physical examination:   General:  AAO x 3,  cooperative, no distress;   HEENT:  Normocephalic, PERRL, otherwise with in Normal limits   Neuro:  CNII-XII intact. , normal motor and sensation, reflexes intact   Lungs:   Clear to auscultation BL, Respirations unlabored,  No wheezes / crackles  Cardio:    S1/S2, RRR, No murmure, No Rubs or Gallops   Abdomen:  Soft,  non-tender, bowel sounds active all four quadrants, no guarding or peritoneal signs.  Muscular  skeletal:  Limited exam -global generalized weaknesses - in bed, able to move all 4 extremities,   2+ pulses,  symmetric, +2  pitting edema  Skin:  Dry, warm to touch, negative for any Rashes,  Wounds: Please see nursing documentation       ------------------------------------------------------------------------------------------------------------------------------------------    LABs:     Latest Ref Rng & Units 06/02/2024    4:53 AM 06/01/2024    9:32 AM 05/12/2024    3:38 PM  CBC  WBC 4.0 - 10.5 K/uL 4.9  12.0  2.8   Hemoglobin 12.0 - 15.0 g/dL 89.0  87.9  89.3   Hematocrit  36.0 - 46.0 % 33.1  37.6  33.9   Platelets 150 - 400 K/uL 93  161  124       Latest Ref Rng & Units 06/02/2024    4:53 AM 06/01/2024    9:32 AM 05/26/2024    3:30 PM  CMP  Glucose 70 - 99 mg/dL 880  711  789   BUN 8 - 23 mg/dL 18  16  13    Creatinine 0.44 - 1.00 mg/dL 8.86  8.71  9.02   Sodium 135 - 145 mmol/L 137  136  141   Potassium 3.5 - 5.1 mmol/L 3.3  4.6  3.9   Chloride 98 - 111 mmol/L 95  99  100   CO2 22 - 32 mmol/L 32  24  31   Calcium  8.9 - 10.3 mg/dL 8.9  8.6  9.0   Total Protein 6.5 - 8.1 g/dL  7.8    Total Bilirubin 0.0 - 1.2 mg/dL  1.5    Alkaline Phos 38 - 126 U/L  74    AST 15 - 41 U/L  33    ALT 0 - 44 U/L  17         Micro Results Recent Results (from the past 240 hours)  Urine Culture     Status: Abnormal   Collection Time: 05/26/24  4:32 PM   Specimen: Urine  Result Value Ref Range Status   MICRO NUMBER: [id number]  Final   SPECIMEN QUALITY: Adequate  Final   Sample Source NOT GIVEN  Final   STATUS: FINAL  Final   ISOLATE 1: Klebsiella pneumoniae (A)  Final    Comment: Greater than 100,000 CFU/mL of Klebsiella pneumoniae      Susceptibility   Klebsiella pneumoniae - URINE CULTURE, REFLEX    AMOX/CLAVULANIC <=2 Sensitive     AMPICILLIN/SULBACTAM 8 Sensitive     CEFAZOLIN * 2  Not Reportable      * For infections other than uncomplicated UTI caused by E. coli, K. pneumoniae or P. mirabilis: Cefazolin  is resistant if MIC > or = 8 mcg/mL. (Distinguishing susceptible versus intermediate for isolates with MIC < or = 4 mcg/mL requires additional testing.) For uncomplicated UTI caused by E. coli, K. pneumoniae or P. mirabilis: Cefazolin  is susceptible if MIC <32 mcg/mL and predicts susceptible to the oral agents cefaclor, cefdinir, cefpodoxime, cefprozil, cefuroxime, cephalexin  and loracarbef.     CEFTAZIDIME <=0.5 Sensitive     CEFEPIME <=0.12 Sensitive     CEFTRIAXONE  <=0.25 Sensitive     CIPROFLOXACIN  0.12 Sensitive     LEVOFLOXACIN 0.25 Sensitive     GENTAMICIN <=1 Sensitive     IMIPENEM <=0.25 Sensitive     MEROPENEM <=0.25 Sensitive     NITROFURANTOIN 64 Intermediate     PIP/TAZO 8 Sensitive     TRIMETH /SULFA * <=20 Sensitive      * For infections other than uncomplicated UTI caused by E. coli, K. pneumoniae or P. mirabilis: Cefazolin  is resistant if MIC > or = 8 mcg/mL. (Distinguishing susceptible versus intermediate for isolates with MIC < or = 4 mcg/mL requires additional testing.) For uncomplicated UTI caused by E. coli, K. pneumoniae or P. mirabilis: Cefazolin  is susceptible if MIC <32 mcg/mL and predicts susceptible to the oral agents cefaclor, cefdinir, cefpodoxime, cefprozil, cefuroxime, cephalexin  and loracarbef. Legend: S = Susceptible  I = Intermediate R = Resistant  NS = Not susceptible SDD = Susceptible Dose Dependent * = Not Tested  NR = Not Reported **NN = See Therapy  Comments   Resp panel by RT-PCR (RSV, Flu A&B, Covid) Anterior Nasal Swab     Status: None   Collection Time: 06/01/24  9:32 AM   Specimen: Anterior Nasal Swab  Result Value Ref Range Status   SARS Coronavirus 2 by RT PCR NEGATIVE NEGATIVE Final    Comment: (NOTE) SARS-CoV-2 target nucleic acids are NOT DETECTED.  The SARS-CoV-2 RNA is generally detectable in  upper respiratory specimens during the acute phase of infection. The lowest concentration of SARS-CoV-2 viral copies this assay can detect is 138 copies/mL. A negative result does not preclude SARS-Cov-2 infection and should not be used as the sole basis for treatment or other patient management decisions. A negative result may occur with  improper specimen collection/handling, submission of specimen other than nasopharyngeal swab, presence of viral mutation(s) within the areas targeted by this assay, and inadequate number of viral copies(<138 copies/mL). A negative result must be combined with clinical observations, patient history, and epidemiological information. The expected result is Negative.  Fact Sheet for Patients:  BloggerCourse.com  Fact Sheet for Healthcare Providers:  SeriousBroker.it  This test is no t yet approved or cleared by the [place]  FDA and  has been authorized for detection and/or diagnosis of SARS-CoV-2 by FDA under an Emergency Use Authorization (EUA). This EUA will remain  in effect (meaning this test can be used) for the duration of the COVID-19 declaration under Section 564(b)(1) of the Act, 21 U.S.C.section 360bbb-3(b)(1), unless the authorization is terminated  or revoked sooner.       Influenza A by PCR NEGATIVE NEGATIVE Final   Influenza B by PCR NEGATIVE NEGATIVE Final    Comment: (NOTE) The Xpert Xpress SARS-CoV-2/FLU/RSV plus assay is intended as an aid in the diagnosis of influenza from Nasopharyngeal swab specimens and should not be used as a sole basis for treatment. Nasal washings and aspirates are unacceptable for Xpert Xpress SARS-CoV-2/FLU/RSV testing.  Fact Sheet for Patients: BloggerCourse.com  Fact Sheet for Healthcare Providers: SeriousBroker.it  This test is not yet approved or cleared by the [place]  FDA and has been  authorized for detection and/or diagnosis of SARS-CoV-2 by FDA under an Emergency Use Authorization (EUA). This EUA will remain in effect (meaning this test can be used) for the duration of the COVID-19 declaration under Section 564(b)(1) of the Act, 21 U.S.C. section 360bbb-3(b)(1), unless the authorization is terminated or revoked.     Resp Syncytial Virus by PCR NEGATIVE NEGATIVE Final    Comment: (NOTE) Fact Sheet for Patients: BloggerCourse.com  Fact Sheet for Healthcare Providers: SeriousBroker.it  This test is not yet approved or cleared by the [place]  FDA and has been authorized for detection and/or diagnosis of SARS-CoV-2 by FDA under an Emergency Use Authorization (EUA). This EUA will remain in effect (meaning this test can be used) for the duration of the COVID-19 declaration under Section 564(b)(1) of the Act, 21 U.S.C. section 360bbb-3(b)(1), unless the authorization is terminated or revoked.  Performed at [facility], [address], [place], [place] [address]   MRSA Next Gen by PCR, Nasal     Status: None   Collection Time: 06/01/24  4:14 PM   Specimen: Nasal Mucosa; Nasal Swab  Result Value Ref Range Status   MRSA by PCR Next Gen NOT DETECTED NOT DETECTED Final    Comment: (NOTE) The GeneXpert MRSA Assay (FDA approved for NASAL specimens only), is one component of a comprehensive MRSA colonization surveillance program. It is not intended to diagnose MRSA infection  nor to guide or monitor treatment for MRSA infections. Test performance is not FDA approved in patients less than 50 years old. Performed at [facility], [address], [place], [place] [address]     Radiology Reports CT Angio Chest PE W and/or Wo Contrast Result Date: 06/01/2024 CLINICAL DATA:  Shortness of breath EXAM: CT ANGIOGRAPHY CHEST WITH CONTRAST TECHNIQUE: Multidetector CT imaging of the chest was performed using the standard protocol  during bolus administration of intravenous contrast. Multiplanar CT image reconstructions and MIPs were obtained to evaluate the vascular anatomy. RADIATION DOSE REDUCTION: This exam was performed according to the departmental dose-optimization program which includes automated exposure control, adjustment of the mA and/or kV according to patient size and/or use of iterative reconstruction technique. CONTRAST:  75mL OMNIPAQUE  IOHEXOL  350 MG/ML SOLN COMPARISON:  06/01/2024 radiograph chest CT from 10/31/2023 FINDINGS: Cardiovascular: No filling defect is identified in the pulmonary arterial tree to suggest pulmonary embolus. Mild cardiomegaly. Left atrial excluded device noted. Coronary, aortic arch, and branch vessel atherosclerotic vascular disease. Mediastinum/Nodes: Unremarkable Lungs/Pleura: Moderate bilateral pleural effusions with passive atelectasis. Hazy ground-glass opacities favoring the lung bases probably from pulmonary edema. Upper Abdomen: Nodular liver compatible with cirrhosis. Splenomegaly. Abdominal aortic atherosclerosis with atheromatous plaque at the origins of the celiac trunk and SMA. Musculoskeletal: Thoracic spondylosis. Review of the MIP images confirms the above findings. IMPRESSION: 1. No filling defect is identified in the pulmonary arterial tree to suggest pulmonary embolus. 2. Moderate bilateral pleural effusions with passive atelectasis. Hazy ground-glass opacities favoring the lung bases probably from pulmonary edema. 3. Mild cardiomegaly. 4. Nodular liver compatible with cirrhosis. Splenomegaly. 5. Thoracic spondylosis. 6.  Aortic Atherosclerosis (ICD10-I70.0). Electronically Signed   By: Ryan Salvage M.D.   On: 06/01/2024 13:44    SIGNED: Adriana DELENA Grams, MD, FHM. FAAFP. Jolynn Pack - [facility] Time spent - 55 min.  In seeing, evaluating and examining the patient. Reviewing medical records, labs, drawn plan of care. [facility],  Pager (please use [email or web address]  to page/ text) Please use Epic Secure Chat for non-urgent communication (7AM-7PM)  If 7PM-7AM, please contact night-coverage [email or web address], 06/02/2024, 12:03 PM

## 2024-06-02 NOTE — Hospital Course (Addendum)
 [name] is a 78 y.o. female with medical history significant of morbid obesity, fibromyalgia, gastroesophageal reflux disease, hypertension, paroxysmal atrial fibrillation, hyperlipidemia, obstructive sleep apnea and NASH cirrhosis; who presented to the hospital secondary to increased shortness of breath and and complaints of orthopnea.  Patient reports symptom has been present for the last 2 days now and worsening.  Patient expressed dyspnea on exertion and have noticed some weight gain.  Patient reported no chest pain, palpitations, sick contacts, fever/chills, abdominal pain, dysuria, hematuria, melena, focal weaknesses or any other complaints.   Patient reported good medication compliance.   Workup in the ED demonstrating no pulmonary embolism and no presence of infiltrates; vascular congestion and suggestive findings for interstitial edema. Elevated BNP seen and due to increase resp distress and hypoxia required the use of BIPAP.    Patient is stabilized after IV Lasix  initiated; [facility] contacted to bring patient to the hospital for further evaluation and management.   Assessment & Plan:   Principal Problem:   Acute respiratory failure with hypoxia (HCC)     Assessment and Plan:  Acute respiratory failure with hypoxia - Remain on 3 L of oxygen, currently satting 92% -Likely due to chronic diastolic heart failure -Monitoring I's and O's, daily weight, continue diuretics  - Recent echo in February 2025 demonstrating EF 55-60%, no wall motion abnormality. -IV Lasix  and metolazone  has been started - Follow daily weights, strict I's and O's and low-sodium diet - Wean off oxygen supplementation as tolerated - Follow clinical response. - Continue metoprolol , Farxiga  and Aldactone  as per home regimen.    Intake/Output Summary (Last 24 hours) at 06/02/2024 0710 Last data filed at 06/02/2024 0000 Gross per 24 hour  Intake 120 ml  Output 3700 ml  Net -3580 ml   Filed Weights    06/01/24 0931 06/01/24 1616 06/02/24 0500  Weight: 99.8 kg 102 kg 97.2 kg      Morbid obesity/OSA -Body mass index is 41.13 kg/m.  - Low caloric diet, portion control and increase physical activity discussed with patient - BiPAP/CPAP nightly has been ordered.   Chronic atrial fibrillation - Continue Toprol  for rate control  - Cont. Epistaxis and Asprin (?  Need for aspirin )   GERD - Continue PPI.   Type 2 diabetes mellitus - Checking CBG q. ACHS, SSI coverage -Holding home regimen - Recent A1c 6.8   Hypertension - Stable continue meds: Including metoprolol , Aldactone  -On Lasix  monitor BP closely   Hypokalemia  Replating orally  diabetic neuropathy - Continue  Neurontin    Depression/anxiety - Continue escitalopram - Mood stable

## 2024-06-02 NOTE — Evaluation (Signed)
 Occupational Therapy Evaluation Patient Details Name: [name] MRN: [id number] DOB: [birth year] Today's Date: 06/02/2024   History of Present Illness   [name] is a 78 y.o. female with medical history significant of morbid obesity, fibromyalgia, gastroesophageal reflux disease, hypertension, paroxysmal atrial fibrillation, hyperlipidemia, obstructive sleep apnea and NASH cirrhosis; who presented to the hospital secondary to increased shortness of breath and and complaints of orthopnea.  Patient reports symptom has been present for the last 2 days now and worsening.  Patient expressed dyspnea on exertion and have noticed some weight gain.  Patient reported no chest pain, palpitations, sick contacts, fever/chills, abdominal pain, dysuria, hematuria, melena, focal weaknesses or any other complaints. (per MD)     Clinical Impressions Pt agreeable to OT and PT co-evaluation. Pt is mostly independent for ADL's at baseline. Pt required min A for lower body dressing today with much extended time. B UE generally weak with good functional use. Min A needed for bed mobility and CGA to min A for EOB to chair transfer. Able to ambulate in the hall with RW and CGA, but pt was noted to veer to the R some which the pt reported was not uncommon. Pt may benefit from a vision assessment due to possible mild peripheral vision deficits. Pt left in the chair with call bell within reach. Pt will benefit from continued OT in the hospital and recommended venue below to increase strength, balance, and endurance for safe ADL's.         If plan is discharge home, recommend the following:   A little help with walking and/or transfers;A little help with bathing/dressing/bathroom;Assistance with cooking/housework;Assist for transportation;Help with stairs or ramp for entrance     Functional Status Assessment   Patient has had a recent decline in their functional status and demonstrates the ability to make  significant improvements in function in a reasonable and predictable amount of time.     Equipment Recommendations   None recommended by OT             Precautions/Restrictions   Precautions Precautions: Fall Recall of Precautions/Restrictions: Intact Restrictions Weight Bearing Restrictions Per Provider Order: No     Mobility Bed Mobility Overal bed mobility: Needs Assistance Bed Mobility: Supine to Sit     Supine to sit: Min assist, HOB elevated     General bed mobility comments: labored movement; single hand held assist.    Transfers Overall transfer level: Needs assistance   Transfers: Sit to/from Stand, Bed to chair/wheelchair/BSC Sit to Stand: Contact guard assist     Step pivot transfers: Contact guard assist, Min assist     General transfer comment: without RW; EOB to chair; seeking single hand held assist/leaning on chair arm.      Balance Overall balance assessment: Needs assistance Sitting-balance support: No upper extremity supported, Feet supported Sitting balance-Leahy Scale: Fair Sitting balance - Comments: seated at EOB   Standing balance support: During functional activity, No upper extremity supported Standing balance-Leahy Scale: Poor Standing balance comment: poor to fair without AD; fair with RW                           ADL either performed or assessed with clinical judgement   ADL Overall ADL's : Needs assistance/impaired     Grooming: Supervision/safety;Contact guard assist;Standing   Upper Body Bathing: Set up;Sitting   Lower Body Bathing: Minimal assistance;Sitting/lateral leans   Upper Body Dressing : Set up;Sitting  Lower Body Dressing: Minimal assistance;Sitting/lateral leans Lower Body Dressing Details (indicate cue type and reason): Asssit to don socks while seated in the bed. Much time and labored effort by the pt while trying to complete the task on her own. Toilet Transfer: Contact guard  assist;Rolling walker (2 wheels);Ambulation Toilet Transfer Details (indicate cue type and reason): Chair to toilet via ambulation with RW. Toileting- Clothing Manipulation and Hygiene: Contact guard assist;Sitting/lateral lean Toileting - Clothing Manipulation Details (indicate cue type and reason): Completed peri-care without physical assist after ambulation with lateral leans.     Functional mobility during ADLs: Contact guard assist;Rolling walker (2 wheels) General ADL Comments: Able to ambulate in the room and hall with RW and CGA. Veering to the R at times when ambulating.     Vision Baseline Vision/History: 1 Wears glasses Ability to See in Adequate Light: 2 Moderately impaired Patient Visual Report: Diplopia (Pt reports diplopia that started las week but has resolved.) Vision Assessment?: Yes Visual Fields: Other (comment) (Possible mild bilateral visual field deficit. More assessment needed from vision specialist. Pt noted to veer to R side at times when ambulating.)     Perception Perception: Not tested       Praxis Praxis: Not tested       Pertinent Vitals/Pain Pain Assessment Pain Assessment: No/denies pain     Extremity/Trunk Assessment Upper Extremity Assessment Upper Extremity Assessment: Generalized weakness   Lower Extremity Assessment Lower Extremity Assessment: Defer to PT evaluation   Cervical / Trunk Assessment Cervical / Trunk Assessment: Kyphotic   Communication Communication Communication: No apparent difficulties   Cognition Arousal: Alert Behavior During Therapy: WFL for tasks assessed/performed Cognition: No apparent impairments                               Following commands: Intact       Cueing  General Comments   Cueing Techniques: Verbal cues;Tactile cues                 Home Living Family/patient expects to be discharged to:: Private residence Living Arrangements: Spouse/significant other Available Help at  Discharge: Family;Available 24 hours/day Type of Home: House Home Access: Stairs to enter [employer] of Steps: 1 Entrance Stairs-Rails: Right Home Layout: One level;Laundry or work area in basement;Able to live on main level with bedroom/bathroom     Bathroom Shower/Tub: Producer, television/film/video: Handicapped height Bathroom Accessibility: Yes   Home Equipment: Agricultural consultant (2 wheels);Rollator (4 wheels);Cane - quad;Shower seat;Grab bars - tub/shower;Hand held shower head          Prior Functioning/Environment Prior Level of Function : Needs assist       Physical Assist : ADLs (physical)   ADLs (physical): Dressing;IADLs Mobility Comments: Pt reprots community ambulation with QC; pt leans on furniture when ambulating in the home. ADLs Comments: Pt reports assist for shower transer and set up assist for dressing only. Spouse also assist with IADL's.    OT Problem List: Decreased strength;Impaired balance (sitting and/or standing)   OT Treatment/Interventions: Self-care/ADL training;Therapeutic exercise;DME and/or AE instruction;Therapeutic activities;Patient/family education;Balance training      OT Goals(Current goals can be found in the care plan section)   Acute Rehab OT Goals Patient Stated Goal: return home OT Goal Formulation: With patient Time For Goal Achievement: 06/16/24 Potential to Achieve Goals: Good   OT Frequency:  Min 2X/week    Co-evaluation PT/OT/SLP Co-Evaluation/Treatment: Yes Reason for Co-Treatment: To  address functional/ADL transfers   OT goals addressed during session: ADL's and self-care                       End of Session Equipment Utilized During Treatment: Rolling walker (2 wheels);Gait belt  Activity Tolerance: Patient tolerated treatment well Patient left: in chair;with call bell/phone within reach  OT Visit Diagnosis: Unsteadiness on feet (R26.81);Other abnormalities of gait and mobility  (R26.89);Muscle weakness (generalized) (M62.81)                Time: 9090-9060 OT Time Calculation (min): 30 min Charges:  OT General Charges $OT Visit: 1 Visit OT Evaluation $OT Eval Low Complexity: 1 Low  Annalaya Wile OT, MOT  Jayson Person 06/02/2024, 10:39 AM

## 2024-06-03 DIAGNOSIS — J9601 Acute respiratory failure with hypoxia: Secondary | ICD-10-CM | POA: Diagnosis not present

## 2024-06-03 LAB — BASIC METABOLIC PANEL WITH GFR
Anion gap: 12 (ref 5–15)
BUN: 20 mg/dL (ref 8–23)
CO2: 32 mmol/L (ref 22–32)
Calcium: 9.7 mg/dL (ref 8.9–10.3)
Chloride: 91 mmol/L — ABNORMAL LOW (ref 98–111)
Creatinine, Ser: 1.45 mg/dL — ABNORMAL HIGH (ref 0.44–1.00)
GFR, Estimated: 37 mL/min — ABNORMAL LOW (ref >60)
Glucose, Bld: 152 mg/dL — ABNORMAL HIGH (ref 70–99)
Potassium: 3.6 mmol/L (ref 3.5–5.1)
Sodium: 135 mmol/L (ref 135–145)

## 2024-06-03 LAB — GLUCOSE, CAPILLARY
Glucose-Capillary: 142 mg/dL — ABNORMAL HIGH (ref 70–99)
Glucose-Capillary: 184 mg/dL — ABNORMAL HIGH (ref 70–99)

## 2024-06-03 LAB — BRAIN NATRIURETIC PEPTIDE: B Natriuretic Peptide: 304 pg/mL — ABNORMAL HIGH (ref 0.0–100.0)

## 2024-06-03 MED ORDER — TORSEMIDE 20 MG PO TABS
40.0000 mg | ORAL_TABLET | Freq: Every day | ORAL | Status: DC
  Administered 2024-06-03: 40 mg via ORAL
  Filled 2024-06-03: qty 2

## 2024-06-03 MED ORDER — POTASSIUM CHLORIDE CRYS ER 20 MEQ PO TBCR: 20.0000 meq | EXTENDED_RELEASE_TABLET | Freq: Every day | ORAL | 1 refills | Status: DC

## 2024-06-03 MED ORDER — SPIRONOLACTONE 12.5 MG HALF TABLET: 12.5000 mg | ORAL_TABLET | Freq: Every day | ORAL | Status: DC

## 2024-06-03 MED ORDER — LACTASE 3000 UNITS PO TABS: 3000.0000 [IU] | ORAL_TABLET | ORAL | Status: DC | PRN

## 2024-06-03 MED ORDER — SPIRONOLACTONE 25 MG PO TABS: 12.5000 mg | ORAL_TABLET | Freq: Every day | ORAL | 1 refills | Status: DC

## 2024-06-03 MED ORDER — METOLAZONE 2.5 MG PO TABS: 2.5000 mg | ORAL_TABLET | Freq: Every day | ORAL | 1 refills | Status: DC

## 2024-06-03 MED ORDER — POTASSIUM CHLORIDE CRYS ER 20 MEQ PO TBCR: 20.0000 meq | EXTENDED_RELEASE_TABLET | Freq: Every day | ORAL | Status: DC

## 2024-06-03 MED ORDER — TORSEMIDE 20 MG PO TABS: 40.0000 mg | ORAL_TABLET | Freq: Every day | ORAL | 0 refills | Status: DC

## 2024-06-03 MED ORDER — METOLAZONE 5 MG PO TABS: 2.5000 mg | ORAL_TABLET | Freq: Every day | ORAL | Status: DC

## 2024-06-03 MED ORDER — GABAPENTIN 100 MG PO CAPS: 500.0000 mg | ORAL_CAPSULE | Freq: Every day | ORAL | 0 refills | Status: DC

## 2024-06-03 NOTE — Progress Notes (Signed)
 Mobility Specialist Progress Note:    06/03/24 1225  Mobility  Activity Ambulated with assistance  Level of Assistance Standby assist, set-up cues, supervision of patient - no hands on  Assistive Device Front wheel walker  Distance Ambulated (ft) 140 ft  Range of Motion/Exercises Active;All extremities  Activity Response Tolerated well  Mobility Referral Yes  Mobility visit 1 Mobility  Mobility Specialist Start Time (ACUTE ONLY) 1225  Mobility Specialist Stop Time (ACUTE ONLY) 1245  Mobility Specialist Time Calculation (min) (ACUTE ONLY) 20 min   Pt received in chair, agreeable to mobility. Required supervision to stand and ambulate with RW. Tolerated well, SpO2 94% on RA at rest. SpO2 90% on RA EOS, returned to chair. NT in room,all needs met.  Dallie Patton Mobility Specialist Please contact via Special educational needs teacher or  Rehab office at [phone]

## 2024-06-03 NOTE — Progress Notes (Signed)
 Heart Failure Navigator Progress Note  Assessed for Heart & Vascular TOC clinic readiness.  Attempted to reach out to patient via telephone to her room 310 at [facility] with no answer.  Then tried to reach patient on her cell phone with no answer as well. Will reach out to the bedside RN to assist with communication for this patient and to provide her with the Living Better with Heart Failure folder at the bedside.  Navigator will reach back out to patient today.  Charmaine Pines, RN, BSN [facility] Heart Failure Navigator Secure Chat Only

## 2024-06-03 NOTE — Care Management Important Message (Signed)
 Important Message  Patient Details  Name: [name] MRN: [id number] Date of Birth: 1946-03-01   Important Message Given:  N/A - LOS <3 / Initial given by admissions     Duwaine LITTIE Ada 06/03/2024, 12:36 PM

## 2024-06-03 NOTE — Plan of Care (Signed)

## 2024-06-03 NOTE — Progress Notes (Signed)
 Heart Failure Navigator Progress Note  Called patient back on the phone to room 310 at [facility].  She answered but stated that she was busy with the nurse at that time.  She did say that she would be discharged today and told me to call her back in a few minutes.  Attempted again at 12:27 with no answer.  Navigator scheduled her a follow-up appt at the Heart Failure Clinic at [facility] and added it to her AVS in case I was unable to reach her before her discharge today.  RN was asked to give her the HF education booklet.  Will continue to try to reach patient prior to her discharge to review HF education with her.  Navigator available for reassessment of patient.   Charmaine Pines, RN, BSN [facility] Heart Failure Navigator Secure Chat Only

## 2024-06-03 NOTE — TOC Transition Note (Signed)
 Transition of Care [facility]) - Discharge Note   Patient Details  Name: [name] MRN: [id number] Date of Birth: 03/28/1946  Transition of Care [facility]) CM/SW Contact:  Sharlyne Stabs, RN Phone Number: 06/03/2024, 10:45 AM   Clinical Narrative:   Patient discharging home with [facility]. MD aware to order. CMS choices reviewed, patient has used [facility] in the past. Referral sent to [facility], he accepted. Added info to AVS.    Final next level of care: Home w Home Health Services Barriers to Discharge: Barriers Resolved   Patient Goals and CMS Choice Patient states their goals for this hospitalization and ongoing recovery are:: Return home CMS Medicare.gov Compare Post Acute Care list provided to:: Patient Choice offered to / list presented to : Patient [place] ownership interest in [facility].provided to:: Patient    Discharge Placement               Patient and family notified of of transfer: 06/03/24  Discharge Plan and Services Additional resources added to the After Visit Summary for         [facility] Arranged: PT HH Agency: [facility] Date [facility] Agency Contacted: 06/03/24 Time HH Agency Contacted: 1045 Representative spoke with at [facility] Agency: Darleene  Social Drivers of Health (SDOH) Interventions SDOH Screenings   Food Insecurity: No Food Insecurity (06/01/2024)  Housing: Low Risk  (06/01/2024)  Transportation Needs: No Transportation Needs (06/01/2024)  Utilities: Not At Risk (06/01/2024)  Alcohol Screen: Low Risk  (01/21/2024)  Depression (PHQ2-9): Low Risk  (04/27/2024)  Financial Resource Strain: Low Risk  (01/21/2024)  Physical Activity: Inactive (01/21/2024)  Social Connections: Moderately Isolated (06/01/2024)  Stress: No Stress Concern Present (01/21/2024)  Tobacco Use: Low Risk  (06/01/2024)  Health Literacy: Adequate Health Literacy (01/21/2024)    Readmission Risk Interventions    06/03/2024   10:41 AM 11/01/2023    1:49 PM  Readmission Risk Prevention  Plan  Transportation Screening Complete Complete  PCP or Specialist Appt within 5-7 Days Complete Complete  Home Care Screening Complete Complete  Medication Review (RN CM) Complete Complete

## 2024-06-03 NOTE — Progress Notes (Signed)
 Heart Failure Navigator Progress Note  Multiple failed attempts to try to reach patient today @ [facility].  Was trying to provide HF education and give her details about her scheduled TOC appointment.  Patient was discharged without being able to ever reach her. TOC was on patient AVS for discharge so not sure if she will try to attend that appointment or not.  Navigator will sign off at this time.  Charmaine Pines, RN, BSN [facility] Heart Failure Navigator Secure Chat Only

## 2024-06-03 NOTE — Discharge Summary (Signed)
 Physician Discharge Summary   Patient: [name] MRN: [id number] DOB: August 12, 1946  Admit date:     06/01/2024  Discharge date: 06/03/24  Discharge Physician: Adriana DELENA Grams   PCP: Candise Aleene DEL, MD   Recommendations at discharge:    Follow-up with PCP in 1 week -Continue PT OT, fall precautions -Recommending checking blood sugars before meals, keep log and present to PCP for adjustment of diabetic medication for better glycemic control -Monitor your weight daily -keep record log, and  present to PCP and cardiologist for medication adjustment -current diuretics Follow-up with a cardiologist in 1-2 week CMP, BNP in 1 week results to PCP and cardiology    Hospital Course: [name] is a 78 y.o. female with medical history significant of morbid obesity, fibromyalgia, gastroesophageal reflux disease, hypertension, paroxysmal atrial fibrillation, hyperlipidemia, obstructive sleep apnea and NASH cirrhosis; who presented to the hospital secondary to increased shortness of breath and and complaints of orthopnea.  Patient reports symptom has been present for the last 2 days now and worsening.  Patient expressed dyspnea on exertion and have noticed some weight gain.  Patient reported no chest pain, palpitations, sick contacts, fever/chills, abdominal pain, dysuria, hematuria, melena, focal weaknesses or any other complaints.   Patient reported good medication compliance.   Workup in the ED demonstrating no pulmonary embolism and no presence of infiltrates; vascular congestion and suggestive findings for interstitial edema. Elevated BNP seen and due to increase resp distress and hypoxia required the use of BIPAP.    Patient is stabilized after IV Lasix  initiated; [facility] contacted to bring patient to the hospital for further evaluation and management.    Acute respiratory failure with hypoxia - Remain on 3 L of oxygen, currently satting 92% -Likely due to chronic diastolic heart  failure -Monitoring I's and O's, daily weight, continue diuretics  - Recent echo in February 2025 demonstrating EF 55-60%, no wall motion abnormality. -IV Lasix  and metolazone  has been started - Follow daily weights, strict I's and O's and low-sodium diet - Wean off oxygen supplementation as tolerated - Follow clinical response. - Continue metoprolol , Farxiga  and Aldactone  as per home regimen.    Intake/Output Summary (Last 24 hours) at 06/02/2024 0710 Last data filed at 06/02/2024 0000 Gross per 24 hour  Intake 120 ml  Output 3700 ml  Net -3580 ml   Filed Weights   06/01/24 0931 06/01/24 1616 06/02/24 0500  Weight: 99.8 kg 102 kg 97.2 kg      Morbid obesity/OSA -Body mass index is 41.13 kg/m.  - Low caloric diet, portion control and increase physical activity discussed with patient - BiPAP/CPAP nightly has been ordered.   Chronic atrial fibrillation - Continue Toprol  for rate control  - Cont. Epistaxis and Asprin (?  Need for aspirin )   GERD - Continue PPI.   Type 2 diabetes mellitus - Checking CBG q. ACHS, SSI coverage -Holding home regimen - Recent A1c 6.8   Hypertension - Stable continue meds: Including metoprolol , Aldactone  -On Lasix  monitor BP closely   Hypokalemia  Replating orally  diabetic neuropathy - Continue  Neurontin    Depression/anxiety - Continue escitalopram - Mood stable          Disposition: Home health Diet recommendation:  Discharge Diet Orders (From admission, onward)     Start     Ordered   06/03/24 0000  Diet - low sodium heart healthy        06/03/24 1111  Cardiac and Carb modified diet DISCHARGE MEDICATION: Allergies as of 06/03/2024       Reactions   Flagyl  [metronidazole  Hcl] Other (See Comments)   Diaphoresis    Omeprazole  Anaphylaxis, Swelling   Tongue and throat swelling   Pioglitazone Other (See Comments), Swelling   Weight gain, tongue swelling   Allevyn Adhesive [wound Dressings] Other (See  Comments)   Unknown   Benzocaine-menthol Swelling   Mouth swelling   Lactose Diarrhea, Other (See Comments)   Gas, bloating   Lactose Intolerance (gi) Diarrhea   Gas, bloating   Metformin  And Related Diarrhea   Shrimp [shellfish Allergy] Itching   Throat and ear itching, if consumed raw   Statins Palpitations   Welchol [colesevelam] Other (See Comments)   GI upset   Adhesive [tape] Other (See Comments)   Skin irritation and bruising    Desipramine  Hcl Itching, Nausea Only, Other (See Comments)   Swimmy headed   Hydromorphone  Itching   Jardiance  [empagliflozin ] Other (See Comments)   Weakness    Nisoldipine Itching   Percocet [oxycodone -acetaminophen ] Itching        Medication List     STOP taking these medications    furosemide  20 MG tablet Commonly known as: LASIX    gabapentin  600 MG tablet Commonly known as: NEURONTIN  Replaced by: gabapentin  100 MG capsule       TAKE these medications    Accu-Chek Guide Test test strip Generic drug: glucose blood USE TO CHECK BLOOD GLUCOSE UP TO 6 TIMES DAILY.   Accu-Chek Softclix Lancets lancets USE TO CHECK BLOOD GLUCOSE UP TO 6 TIMES DAILY AS DIRECTED   albuterol  108 (90 Base) MCG/ACT inhaler Commonly known as: VENTOLIN  HFA Inhale 2 puffs into the lungs every 6 (six) hours as needed for wheezing or shortness of breath.   aspirin  EC 81 MG tablet Take 1 tablet (81 mg total) by mouth daily. Swallow whole.   B-D UF III MINI PEN NEEDLES 31G X 5 MM Misc Generic drug: Insulin  Pen Needle USE TO INJECT INSULINS EQUAL TO 6 TIMES DAILY.   blood glucose meter kit and supplies Kit Use up to six times daily as directed. DX. E11.9   citalopram  20 MG tablet Commonly known as: CELEXA  Take 1 tablet (20 mg total) by mouth daily.   dapagliflozin  propanediol 5 MG Tabs tablet Commonly known as: Farxiga  Take 1 tablet (5 mg total) by mouth daily.   esomeprazole  40 MG capsule Commonly known as: NexIUM  Take 1 capsule (40 mg  total) by mouth daily before breakfast.   FreeStyle Libre 3 Reader [facility] Use as directed to check blood sugars   FreeStyle Libre 3 Sensor Misc 1 each by Does not apply route every 14 (fourteen) days. Please apply for 14 days and then switch to new sensor   FreeStyle Libre 3 Plus Sensor Misc Change sensor every 15 days.   gabapentin  100 MG capsule Commonly known as: NEURONTIN  Take 5 capsules (500 mg total) by mouth daily at 6 (six) AM. Start taking on: June 04, 2024 Replaces: gabapentin  600 MG tablet   insulin  lispro 100 UNIT/ML KwikPen Commonly known as: HumaLOG  KwikPen 115 units with breakfast, 108 units with lunch, and 65 U at supper   lactase 3000 units tablet Commonly known as: LACTAID Take 3,000 Units by mouth as needed (when eating foods containing dairy).   metoprolol  succinate 100 MG 24 hr tablet Commonly known as: TOPROL -XL TAKE 1.5 (one and one half tablet) TABLET BY MOUTH 2 TIMES DAILY. TAKE WITH OR  IMMEDIATELY FOLLOWING A MEAL   mirabegron  ER 25 MG Tb24 tablet Commonly known as: MYRBETRIQ  Take 1 tablet (25 mg total) by mouth daily.   potassium chloride  SA 20 MEQ tablet Commonly known as: KLOR-CON  M Take 1 tablet (20 mEq total) by mouth daily. What changed:  medication strength how much to take   spironolactone  25 MG tablet Commonly known as: ALDACTONE  Take 0.5 tablets (12.5 mg total) by mouth daily. What changed: how much to take   torsemide  20 MG tablet Commonly known as: DEMADEX  Take 2 tablets (40 mg total) by mouth daily. Start taking on: June 04, 2024   Tremfya  Pen 200 MG/2ML Soaj Generic drug: Guselkumab  Inject 200 mg into the skin every 28 (twenty-eight) days. Starting 4 weeks after the last Tremfya  infusion which will be around June 10, 2024 Start taking on: July 08, 2024   Trulicity  1.5 MG/0.5ML Soaj Generic drug: Dulaglutide  INJECT 1.5 MG (0.5ML) UNDER THE SKIN ONCE A WEEK What changed:  how much to take how to take  this when to take this        Follow-up Information     [facility] Follow up.   Specialty: Home Health Services Why: PT will call to schedule your first home visit. Contact information: [address] [place] [address] [phone]                Discharge Exam: Filed Weights   06/01/24 1616 06/02/24 0500 06/03/24 0500  Weight: 102 kg 97.2 kg 87.6 kg        General:  AAO x 3,  cooperative, no distress;   HEENT:  Normocephalic, PERRL, otherwise with in Normal limits   Neuro:  CNII-XII intact. , normal motor and sensation, reflexes intact   Lungs:   Clear to auscultation BL, Respirations unlabored,  No wheezes / crackles  Cardio:    S1/S2, RRR, No murmure, No Rubs or Gallops   Abdomen:  Soft, non-tender, bowel sounds active all four quadrants, no guarding or peritoneal signs.  Muscular  skeletal:  Limited exam -global generalized weaknesses - in bed, able to move all 4 extremities,   2+ pulses,  symmetric, +1  pitting edema  Skin:  Dry, warm to touch, negative for any Rashes,  Wounds: Please see nursing documentation          Condition at discharge: good  The results of significant diagnostics from this hospitalization (including imaging, microbiology, ancillary and laboratory) are listed below for reference.   Imaging Studies: CT Angio Chest PE W and/or Wo Contrast Result Date: 06/01/2024 CLINICAL DATA:  Shortness of breath EXAM: CT ANGIOGRAPHY CHEST WITH CONTRAST TECHNIQUE: Multidetector CT imaging of the chest was performed using the standard protocol during bolus administration of intravenous contrast. Multiplanar CT image reconstructions and MIPs were obtained to evaluate the vascular anatomy. RADIATION DOSE REDUCTION: This exam was performed according to the departmental dose-optimization program which includes automated exposure control, adjustment of the mA and/or kV according to patient size and/or use of iterative  reconstruction technique. CONTRAST:  75mL OMNIPAQUE  IOHEXOL  350 MG/ML SOLN COMPARISON:  06/01/2024 radiograph chest CT from 10/31/2023 FINDINGS: Cardiovascular: No filling defect is identified in the pulmonary arterial tree to suggest pulmonary embolus. Mild cardiomegaly. Left atrial excluded device noted. Coronary, aortic arch, and branch vessel atherosclerotic vascular disease. Mediastinum/Nodes: Unremarkable Lungs/Pleura: Moderate bilateral pleural effusions with passive atelectasis. Hazy ground-glass opacities favoring the lung bases probably from pulmonary edema. Upper Abdomen: Nodular liver compatible with cirrhosis. Splenomegaly. Abdominal  aortic atherosclerosis with atheromatous plaque at the origins of the celiac trunk and SMA. Musculoskeletal: Thoracic spondylosis. Review of the MIP images confirms the above findings. IMPRESSION: 1. No filling defect is identified in the pulmonary arterial tree to suggest pulmonary embolus. 2. Moderate bilateral pleural effusions with passive atelectasis. Hazy ground-glass opacities favoring the lung bases probably from pulmonary edema. 3. Mild cardiomegaly. 4. Nodular liver compatible with cirrhosis. Splenomegaly. 5. Thoracic spondylosis. 6.  Aortic Atherosclerosis (ICD10-I70.0). Electronically Signed   By: Ryan Salvage M.D.   On: 06/01/2024 13:44   DG Chest Port 1 View Result Date: 06/01/2024 CLINICAL DATA:  Dyspnea. EXAM: PORTABLE CHEST 1 VIEW COMPARISON:  January 09, 2024. FINDINGS: Mild cardiomegaly is noted with mild central pulmonary vascular congestion. Possible minimal bilateral pulmonary edema is noted. Small right pleural effusion is noted. Bony thorax is unremarkable. IMPRESSION: Mild cardiomegaly with mild central pulmonary vascular congestion and possible minimal bilateral pulmonary edema. Small right pleural effusion. Electronically Signed   By: Lynwood Landy Raddle M.D.   On: 06/01/2024 10:23    Microbiology: Results for orders placed or performed during  the hospital encounter of 06/01/24  Resp panel by RT-PCR (RSV, Flu A&B, Covid) Anterior Nasal Swab     Status: None   Collection Time: 06/01/24  9:32 AM   Specimen: Anterior Nasal Swab  Result Value Ref Range Status   SARS Coronavirus 2 by RT PCR NEGATIVE NEGATIVE Final    Comment: (NOTE) SARS-CoV-2 target nucleic acids are NOT DETECTED.  The SARS-CoV-2 RNA is generally detectable in upper respiratory specimens during the acute phase of infection. The lowest concentration of SARS-CoV-2 viral copies this assay can detect is 138 copies/mL. A negative result does not preclude SARS-Cov-2 infection and should not be used as the sole basis for treatment or other patient management decisions. A negative result may occur with  improper specimen collection/handling, submission of specimen other than nasopharyngeal swab, presence of viral mutation(s) within the areas targeted by this assay, and inadequate number of viral copies(<138 copies/mL). A negative result must be combined with clinical observations, patient history, and epidemiological information. The expected result is Negative.  Fact Sheet for Patients:  BloggerCourse.com  Fact Sheet for Healthcare Providers:  SeriousBroker.it  This test is no t yet approved or cleared by the [place]  FDA and  has been authorized for detection and/or diagnosis of SARS-CoV-2 by FDA under an Emergency Use Authorization (EUA). This EUA will remain  in effect (meaning this test can be used) for the duration of the COVID-19 declaration under Section 564(b)(1) of the Act, 21 U.S.C.section 360bbb-3(b)(1), unless the authorization is terminated  or revoked sooner.       Influenza A by PCR NEGATIVE NEGATIVE Final   Influenza B by PCR NEGATIVE NEGATIVE Final    Comment: (NOTE) The Xpert Xpress SARS-CoV-2/FLU/RSV plus assay is intended as an aid in the diagnosis of influenza from Nasopharyngeal swab  specimens and should not be used as a sole basis for treatment. Nasal washings and aspirates are unacceptable for Xpert Xpress SARS-CoV-2/FLU/RSV testing.  Fact Sheet for Patients: BloggerCourse.com  Fact Sheet for Healthcare Providers: SeriousBroker.it  This test is not yet approved or cleared by the [place]  FDA and has been authorized for detection and/or diagnosis of SARS-CoV-2 by FDA under an Emergency Use Authorization (EUA). This EUA will remain in effect (meaning this test can be used) for the duration of the COVID-19 declaration under Section 564(b)(1) of the Act, 21 U.S.C. section 360bbb-3(b)(1), unless the authorization  is terminated or revoked.     Resp Syncytial Virus by PCR NEGATIVE NEGATIVE Final    Comment: (NOTE) Fact Sheet for Patients: BloggerCourse.com  Fact Sheet for Healthcare Providers: SeriousBroker.it  This test is not yet approved or cleared by the [place]  FDA and has been authorized for detection and/or diagnosis of SARS-CoV-2 by FDA under an Emergency Use Authorization (EUA). This EUA will remain in effect (meaning this test can be used) for the duration of the COVID-19 declaration under Section 564(b)(1) of the Act, 21 U.S.C. section 360bbb-3(b)(1), unless the authorization is terminated or revoked.  Performed at [facility], [address], [place], [place] [address]   MRSA Next Gen by PCR, Nasal     Status: None   Collection Time: 06/01/24  4:14 PM   Specimen: Nasal Mucosa; Nasal Swab  Result Value Ref Range Status   MRSA by PCR Next Gen NOT DETECTED NOT DETECTED Final    Comment: (NOTE) The GeneXpert MRSA Assay (FDA approved for NASAL specimens only), is one component of a comprehensive MRSA colonization surveillance program. It is not intended to diagnose MRSA infection nor to guide or monitor treatment for MRSA infections. Test  performance is not FDA approved in patients less than 61 years old. Performed at [facility], [address] [place], [place] [address]    *Note: Due to a large number of results and/or encounters for the requested time period, some results have not been displayed. A complete set of results can be found in Results Review.    Labs: CBC: Recent Labs  Lab 06/01/24 0932 06/02/24 0453  WBC 12.0* 4.9  NEUTROABS 6.6  --   HGB 12.0 10.9*  HCT 37.6 33.1*  MCV 97.4 94.6  PLT 161 93*   Basic Metabolic Panel: Recent Labs  Lab 06/01/24 0932 06/01/24 1120 06/02/24 0453 06/03/24 0400  NA 136  --  137 135  K 4.6  --  3.3* 3.6  CL 99  --  95* 91*  CO2 24  --  32 32  GLUCOSE 288*  --  119* 152*  BUN 16  --  18 20  CREATININE 1.28*  --  1.13* 1.45*  CALCIUM  8.6*  --  8.9 9.7  MG 1.8  --   --   --   PHOS  --  4.0  --   --    Liver Function Tests: Recent Labs  Lab 06/01/24 0932  AST 33  ALT 17  ALKPHOS 74  BILITOT 1.5*  PROT 7.8  ALBUMIN 3.0*   CBG: Recent Labs  Lab 06/02/24 0737 06/02/24 1138 06/02/24 1634 06/02/24 2140 06/03/24 0723  GLUCAP 121* 178* 200* 275* 142*    Discharge time spent: greater than 45 minutes.  Signed: Adriana DELENA Grams, MD [facility] 06/03/2024

## 2024-06-04 ENCOUNTER — Telehealth: Payer: Self-pay

## 2024-06-04 NOTE — Transitions of Care (Post Inpatient/ED Visit) (Signed)
   06/04/2024  Name: [name] MRN: [id number] DOB: Feb 25, 1946  Today's TOC FU Call Status: Today's TOC FU Call Status:: Unsuccessful Call (1st Attempt) Unsuccessful Call (1st Attempt) Date: 06/04/24  Attempted to reach the patient regarding the most recent Inpatient/ED visit.  Follow Up Plan: Additional outreach attempts will be made to reach the patient to complete the Transitions of Care (Post Inpatient/ED visit) call.   Alan Ee, RN, BSN, CEN [employer]- Transition of Care Team.  [facility] [phone]

## 2024-06-07 ENCOUNTER — Telehealth: Payer: Self-pay

## 2024-06-07 DIAGNOSIS — I5033 Acute on chronic diastolic (congestive) heart failure: Secondary | ICD-10-CM

## 2024-06-07 DIAGNOSIS — J9601 Acute respiratory failure with hypoxia: Secondary | ICD-10-CM

## 2024-06-07 NOTE — Transitions of Care (Post Inpatient/ED Visit) (Signed)
 06/07/2024  Name: [name] MRN: [id number] DOB: 05-01-1946  Today's TOC FU Call Status: Today's TOC FU Call Status:: Successful TOC FU Call Completed TOC FU Call Complete Date: 06/07/24 Patient's Name and Date of Birth confirmed.  Transition Care Management Follow-up Telephone Call How have you been since you were released from the hospital?: Better Any questions or concerns?: No  Items Reviewed: Did you receive and understand the discharge instructions provided?: Yes Medications obtained,verified, and reconciled?: Yes (Medications Reviewed) Any new allergies since your discharge?: No Dietary orders reviewed?: Yes Type of Diet Ordered:: low salt heart healthy diet Do you have support at home?: Yes People in Home [RPT]: spouse Name of Support/Comfort Primary Source: [name]  Medications Reviewed Today: Medications Reviewed Today     Reviewed by Rumalda Alan PENNER, RN (Registered Nurse) on 06/07/24 at 1503  Med List Status: <None>   Medication Order Taking? Sig Documenting Provider Last Dose Status Informant  Accu-Chek Softclix Lancets lancets [id number] Yes USE TO CHECK BLOOD GLUCOSE UP TO 6 TIMES DAILY AS DIRECTED McGowen, Aleene DEL, MD  Active Spouse/Significant Other, Pharmacy Records  acetaminophen  (TYLENOL ) tablet 650 mg [id number]   Castaneda Mayorga, Daniel, MD  Active   albuterol  (VENTOLIN  HFA) 108 [phone] Base) MCG/ACT inhaler [id number] Yes Inhale 2 puffs into the lungs every 6 (six) hours as needed for wheezing or shortness of breath. McGowen, Philip H, MD  Active Spouse/Significant Other, Pharmacy Records  aspirin  EC 81 MG tablet [id number] Yes Take 1 tablet (81 mg total) by mouth daily. Swallow whole. Arvil Kate BIRCH, NP  Active Spouse/Significant Other, Pharmacy Records  blood glucose meter kit and supplies KIT [id number] Yes Use up to six times daily as directed. DX. E11.9 McGowen, Aleene DEL, MD  Active Spouse/Significant Other, Pharmacy Records    Discontinued 12/05/11 1452  (Completed Course)   citalopram  (CELEXA ) 20 MG tablet [id number] Yes Take 1 tablet (20 mg total) by mouth daily. McGowen, Aleene DEL, MD  Active Spouse/Significant Other, Pharmacy Records  Continuous Glucose Receiver (FREESTYLE LIBRE 3 READER) DEVI [id number] Yes Use as directed to check blood sugars Lowella, Benton CROME, PA  Active Spouse/Significant Other, Pharmacy Records  Continuous Glucose Sensor (FREESTYLE LIBRE 3 PLUS SENSOR) MISC [id number] Yes Change sensor every 15 days. McGowen, Aleene DEL, MD  Active Spouse/Significant Other, Pharmacy Records  Continuous Glucose Sensor (FREESTYLE LIBRE 3 SENSOR) [place] [id number] Yes 1 each by Does not apply route every 14 (fourteen) days. Please apply for 14 days and then switch to new sensor Crain, Whitney L, PA  Active Spouse/Significant Other, Pharmacy Records  dapagliflozin  propanediol (FARXIGA ) 5 MG TABS tablet [id number] Yes Take 1 tablet (5 mg total) by mouth daily. Crain, Whitney L, PA  Active Spouse/Significant Other, Pharmacy Records  diphenhydrAMINE  (BENADRYL ) capsule 25 mg [id number]   Castaneda Mayorga, Toribio, MD  Active   Dulaglutide  (TRULICITY ) 1.5 MG/0.5ML EMMANUEL [id number] Yes INJECT 1.5 MG (0.5ML) UNDER THE SKIN ONCE A WEEK McGowen, Aleene DEL, MD  Active Spouse/Significant Other, Pharmacy Records  esomeprazole  (NEXIUM ) 40 MG capsule [id number] Yes Take 1 capsule (40 mg total) by mouth daily before breakfast. Castaneda Mayorga, Toribio, MD  Active Spouse/Significant Other, Pharmacy Records  gabapentin  (NEURONTIN ) 100 MG capsule [id number] Yes Take 5 capsules (500 mg total) by mouth daily at 6 (six) AM. Shahmehdi, Seyed A, MD  Active   glucose blood (ACCU-CHEK GUIDE TEST) test strip [id number] Yes USE TO CHECK BLOOD GLUCOSE UP TO 6 TIMES DAILY. McGowen, Aleene DEL, MD  Active Spouse/Significant Other, Pharmacy Records  Guselkumab  (TREMFYA  PEN) 200 MG/2ML SOAJ [id number] Yes Inject 200 mg into the skin every 28 (twenty-eight) days. Starting 4 weeks after the last  Tremfya  infusion which will be around June 10, 2024 Castaneda Mayorga, Toribio, MD  Active Spouse/Significant Other, Pharmacy Records  insulin  lispro (HUMALOG  [facility]) 100 UNIT/ML KwikPen [id number] Yes 115 units with breakfast, 108 units with lunch, and 65 U at supper McGowen, Aleene DEL, MD  Active Spouse/Significant Other, Pharmacy Records  Insulin  Pen Needle (B-D UF III MINI PEN NEEDLES) 31G X 5 MM MISC [id number] Yes USE TO INJECT INSULINS EQUAL TO 6 TIMES DAILY. McGowen, Aleene DEL, MD  Active Spouse/Significant Other, Pharmacy Records  lactase (LACTAID) 3000 UNITS tablet [id number] Yes Take 3,000 Units by mouth as needed (when eating foods containing dairy).  [provider]  Active Spouse/Significant Other, Pharmacy Records  metolazone  (ZAROXOLYN ) 2.5 MG tablet [id number] Yes Take 1 tablet (2.5 mg total) by mouth daily. Willette Adriana LABOR, MD  Active   metoprolol  succinate (TOPROL -XL) 100 MG 24 hr tablet [id number] Yes TAKE 1.5 (one and one half tablet) TABLET BY MOUTH 2 TIMES DAILY. TAKE WITH OR IMMEDIATELY FOLLOWING A MEAL Cleaver, Josefa HERO, NP  Active Spouse/Significant Other, Pharmacy Records  mirabegron  ER (MYRBETRIQ ) 25 MG TB24 tablet [id number] Yes Take 1 tablet (25 mg total) by mouth daily. McGowen, Aleene DEL, MD  Active Spouse/Significant Other, Pharmacy Records  potassium chloride  SA (KLOR-CON  M) 20 MEQ tablet [id number] Yes Take 1 tablet (20 mEq total) by mouth daily. Willette Adriana LABOR, MD  Active   spironolactone  (ALDACTONE ) 25 MG tablet [id number] Yes Take 0.5 tablets (12.5 mg total) by mouth daily. Willette Adriana LABOR, MD  Active   torsemide  (DEMADEX ) 20 MG tablet [id number]  Take 2 tablets (40 mg total) by mouth daily.  Patient not taking: Reported on 06/07/2024   Willette Adriana LABOR, MD  Active             Home Care and Equipment/Supplies: Were Home Health Services Ordered?: Yes Name of Home Health Agency:: [facility] Has Agency set up a time to come to your home?: Yes First  Home Health Visit Date: 06/10/24 (per patient request.) Any new equipment or medical supplies ordered?: No  Functional Questionnaire: Do you need assistance with bathing/showering or dressing?: No Do you need assistance with meal preparation?: No Do you need assistance with eating?: No Do you have difficulty maintaining continence: No Do you need assistance with getting out of bed/getting out of a chair/moving?: Yes (sleeps in recliner) Do you have difficulty managing or taking your medications?: Yes  Follow up appointments reviewed: PCP Follow-up appointment confirmed?: No MD Provider Line Number:[phone] Given: No Specialist Hospital Follow-up appointment confirmed?: Yes Date of Specialist follow-up appointment?: 06/09/24 Follow-Up Specialty Provider:: CHF Do you need transportation to your follow-up appointment?: No Do you understand care options if your condition(s) worsen?: Yes-patient verbalized understanding  SDOH Interventions Today    Flowsheet Row Most Recent Value  SDOH Interventions   Food Insecurity Interventions Intervention Not Indicated  Housing Interventions Intervention Not Indicated  Utilities Interventions Intervention Not Indicated    Today's Vitals   06/07/24 1449  PainSc: 0-No pain    Placed call to patient who was having difficulty with medications and remembering.  Patient got husband , [name] to take call.   Mr. Martorano reports that patient has difficulty with word searching.  Patient reports to me that she does not have her medications. Patient did not take her insulin  this am because she forgot. Husband  states he forgot it as well.  Husband reports that herlene 3 is not working as well.  Discussed with husband that there seems to be a concern about patient taking her medication.  Husband confirms that patient has not taken torsemide  as husband did not know where prescription was.  Patient is not weighing daily. Reviewed importance of daily weights and  recording. Encouraged husband to make an appointment with PCP, while I am calling CHF clinic to inquire about an appointment that patient is unaware of....  Placed call to CHF clinic-  [address], code 1420. [facility] off [address].    Goals Addressed             This Visit's Progress    VBCI Transitions of Care (TOC) Care Plan       Problems:  Recent Hospitalization for treatment of admission for heart failure and respiratory distress Knowledge Deficit Related to management olf heart failure, Medication management barrier  missing medications , and No Hospital Follow Up Provider appointment no PCP hospital follow up No weights Continuous glucose monitoring not working.   Goal:  Over the next 30 days, the patient will not experience hospital readmission  Interventions:  Transitions of Care: Doctor Visits  - discussed the importance of doctor visits Contacted Health RN/OT/PT - Placed call to [facility] about start of service. Start of service on 06/10/2024 per patient request.  Long discussion with patient and husband to try to locate medications.in the home.   Reviewed and offered 30 day TOC program and husband consented. Provided my contact information and scheduled next weeks appointment.  Encouraged husband to call PCP office and make a hospital follow up appointment. He confirms that he did.  Called CHF clinic and got address for husband.  Heart Failure Interventions: Basic overview and discussion of pathophysiology of Heart Failure reviewed Provided education on low sodium diet Reviewed Heart Failure Action Plan in depth and provided verbal guidelines and referred patient and husband to reviewed AVS with written guidelines of when to call MD for weight gain.  Assessed need for readable accurate scales in home Provided education about placing scale on hard, flat surface Advised patient to weigh each morning after  emptying bladder Discussed importance of daily weight and advised patient to weigh and record daily Reviewed role of diuretics in prevention of fluid overload and management of heart failure; Discussed the importance of keeping all appointments with provider Assessed social determinant of health barriers  Placed call to heart failure clinic to confirm address:  [address]  Parking code of 1420. Husband voiced understanding.  Long discussion with husband about heart failure management, DM management and medications.  Referral placed for pharmacy.   Husband stated they are not managing well. - Patient with memory issues but denies needing testing.   Patient Self Care Activities: Weigh daily and record weights Monitor CBG 3 times a day Take medications as prescribed Call MD office for changes in condition Review discharge instructions again on your own. Use your inhaler as prescribed.  Take you insulin  Go to her heart failure appointment on 06/09/2024   Follow low salt diet. Do your home exercises when PT starts ( 06/10/2024)  Plan:  Telephone follow up appointment with care management team member scheduled for:  06/15/2024 at 11 am The patient has been provided with contact information for the care management team and has been advised to call with any health  related questions or concerns.        This note sent to PCP Alan Ee, RN, BSN, CEN Population Health- Transition of Care Team.  [facility] [phone]

## 2024-06-07 NOTE — Telephone Encounter (Unsigned)
 Copied from CRM [id number]. Topic: Clinical - Home Health Verbal Orders >> Jun 04, 2024  2:41 PM Roselie BROCKS wrote: [facility]  Callback Number: [phone] Service Requested: Physical Therapy Frequency: Thursday sept 11, 2025 Any new concerns about the patient? No

## 2024-06-08 ENCOUNTER — Telehealth (HOSPITAL_COMMUNITY): Payer: Self-pay

## 2024-06-08 ENCOUNTER — Telehealth: Payer: Self-pay | Admitting: *Deleted

## 2024-06-08 ENCOUNTER — Other Ambulatory Visit: Payer: Self-pay

## 2024-06-08 VITALS — Wt 201.0 lb

## 2024-06-08 DIAGNOSIS — I5033 Acute on chronic diastolic (congestive) heart failure: Secondary | ICD-10-CM

## 2024-06-08 DIAGNOSIS — E1140 Type 2 diabetes mellitus with diabetic neuropathy, unspecified: Secondary | ICD-10-CM

## 2024-06-08 NOTE — Patient Instructions (Signed)
 Hello [name],  It was great talking with you! Here's a summary of recommendations for your health:  Heart Failure: - Weigh yourself daily in the morning and keep a record of these weights - Follow your heart doctor's recommendations about when to call when your weight is too high - Keep limiting your fluid intake - Try taking torsemide  all in the morning to prevent going to the bathroom during the night   Diabetes: - Take each insulin  dose 5 minutes before meal  - Check your blood glucose twice daily, first thing in the morning before food and 2 hours after a meal until your [place] 3 is fixed - Whenever you feel shaky, sweaty, and dizzy, test your blood sugar. If it's <70 requires, have some sugar to treat this low blood sugar event  Other recommendations: - You can take your old medications to a local pharmacy for disposal - Start using your CPAP again and call your sleep doctor and/or primary care doctor if you are unsure why to use it   Follow-up with your primary care team if you have any questions about these recommendations!   Jon Piety, PharmD, MS, BCACP [facility] Health Pharmacist

## 2024-06-08 NOTE — Progress Notes (Signed)
 06/08/2024 Name: [name] MRN: [id number] DOB: [birth year]  Chief Complaint  Patient presents with   Congestive Heart Failure   Diabetes    [name] is a 78 y.o. year old female who presented for a telephone visit. Present with her husband [name].   They were referred to the pharmacist by their PCP for assistance in managing complex medication management.    Subjective:  Began visit by discussing heart failure. Used to take furosemide , now taking torsemide  (took first dose yesterday). Weight this morning was 201 lbs. Husband [name] states this is down from her last weight. Did not report prior weight(s) taken at home. Reports nocturia with torsemide , which she wishes to avoid if possible. Per mobility and physical therapy notes, patient uses walker.  [name] manages patients medications now, splits tablets and fills weekly pill box for [place]. Refills medication with no issues, denies cost issues related to medications. [name] reminds Mata to take her medications, and denies any missed doses in the past two weeks.   Then discussed her blood sugar, states her Libre 3 monitor does not register her sensor at this time. Prior to her hospital visit, states the [place] monitor was working well. They have notified [facility] and another care team member about this. Was notified that the sensor is too soon to fill. They plan to wait until the next refill is due to place a new sensor.   Have been using finger stick testing as a back-up. Have not been recording these values manually. [name] recalls these to be low 200's. States there was an episode of hypoglycemia the night of 09/07, which they treated with soda and a cookie. Michaeleen takes Trulicity  on Sundays, and takes Humalog  after meals. Has been advised to use sliding scale insulin  in the past, but uses fixed dosing below.  Humalog  R U500 115 units after breakfast 108 at lunch 65 at dinner  A1C records: 05/03/24: 6.8 11/18/23: 7.7 05/29/23:  8.5 02/26/23: 7.6 11/28/22: 6.5  Past intolerances noted in her chart include to metformin , statins, Jardiance , and pioglitazone. Has a history of use with Invokana , glipizide , Victoza , U500, Tresiba , Levemir , Lantus , Semglee , Victoza .  Asked about what to do with expired medications or medications discontinued by care team.  Apolinar last used albuterol  on the day she reported to the ED (09/02), has not needed to use it since then. Does not use CPAP currently, but states she gets a good night sleep when she does use it. [name] thought she was doing ok with her breathing, so they didn't need to use it.  They do not have online access to view AVS from this visit today.  Care Team: Primary Care Provider: Candise Aleene DEL, MD ; Next Scheduled Visit: is in November Cardiologist: next scheduled visit is tomorrow  Medication Access/Adherence  Current Pharmacy:  [id number] [place] [place] - [address] [place] [address] Phone: [phone] Fax: [phone]  [facility] - [place], [place] - [address] [place] [address] Phone: [phone] Fax: [phone]  [name] LONG - [facility] [address] [place] [address] Phone: [phone] Fax: [phone]   Patient reports affordability concerns with their medications: No  Patient reports access/transportation concerns to their pharmacy: No  Patient reports adherence concerns with their medications:  Yes    Objective:  Lab Results  Component Value Date   HGBA1C 6.8 (A) 05/03/2024  HGBA1C 6.8 05/03/2024   HGBA1C 6.8 (A) 05/03/2024   HGBA1C 6.8 05/03/2024    Lab Results  Component Value Date   CREATININE 1.45 (H) 06/03/2024   BUN 20 06/03/2024   NA 135 06/03/2024   K 3.6 06/03/2024   CL 91 (L) 06/03/2024   CO2 32 06/03/2024    Lab Results  Component Value Date   CHOL 149 05/06/2023   HDL  38 (L) 05/06/2023   LDLCALC 89 05/06/2023   LDLDIRECT 69.5 01/24/2011   TRIG 123 05/06/2023   CHOLHDL 3.9 05/06/2023    Medications Reviewed Today     Reviewed by [place] Jon SAUNDERS on 06/08/24 at 1306  Med List Status: <None>   Medication Order Taking? Sig Documenting Provider Last Dose Status Informant  Accu-Chek Softclix Lancets lancets [id number] Yes USE TO CHECK BLOOD GLUCOSE UP TO 6 TIMES DAILY AS DIRECTED McGowen, Aleene DEL, MD  Active Spouse/Significant Other, Pharmacy Records  acetaminophen  (TYLENOL ) tablet 650 mg [id number]   Castaneda Mayorga, Daniel, MD  Active   albuterol  (VENTOLIN  HFA) 108 [phone] Base) MCG/ACT inhaler [id number]  Inhale 2 puffs into the lungs every 6 (six) hours as needed for wheezing or shortness of breath. McGowen, Philip H, MD  Active Spouse/Significant Other, Pharmacy Records  aspirin  EC 81 MG tablet [id number]  Take 1 tablet (81 mg total) by mouth daily. Swallow whole. Arvil Kate BIRCH, NP  Active Spouse/Significant Other, Pharmacy Records  blood glucose meter kit and supplies KIT [id number]  Use up to six times daily as directed. DX. E11.9 McGowen, Aleene DEL, MD  Active Spouse/Significant Other, Pharmacy Records    Discontinued 12/05/11 1452 (Completed Course)   citalopram  (CELEXA ) 20 MG tablet [id number]  Take 1 tablet (20 mg total) by mouth daily. McGowen, Aleene DEL, MD  Active Spouse/Significant Other, Pharmacy Records  Continuous Glucose Receiver (FREESTYLE LIBRE 3 READER) DEVI [id number] Yes Use as directed to check blood sugars Lowella, Benton CROME, PA  Active Spouse/Significant Other, Pharmacy Records  Continuous Glucose Sensor (FREESTYLE LIBRE 3 PLUS SENSOR) MISC [id number] Yes Change sensor every 15 days. McGowen, Aleene DEL, MD  Active Spouse/Significant Other, Pharmacy Records  Continuous Glucose Sensor (FREESTYLE LIBRE 3 SENSOR) [place] [id number] Yes 1 each by Does not apply route every 14 (fourteen) days. Please apply for 14 days and then switch to new sensor Crain,  Whitney L, PA  Active Spouse/Significant Other, Pharmacy Records  dapagliflozin  propanediol (FARXIGA ) 5 MG TABS tablet [id number] Yes Take 1 tablet (5 mg total) by mouth daily. Crain, Whitney L, PA  Active Spouse/Significant Other, Pharmacy Records  diphenhydrAMINE  (BENADRYL ) capsule 25 mg [id number]   Castaneda Mayorga, Toribio, MD  Active   Dulaglutide  (TRULICITY ) 1.5 MG/0.5ML EMMANUEL [id number] Yes INJECT 1.5 MG (0.5ML) UNDER THE SKIN ONCE A WEEK McGowen, Aleene DEL, MD  Active Spouse/Significant Other, Pharmacy Records  esomeprazole  (NEXIUM ) 40 MG capsule [id number]  Take 1 capsule (40 mg total) by mouth daily before breakfast. Castaneda Mayorga, Toribio, MD  Active Spouse/Significant Other, Pharmacy Records  gabapentin  (NEURONTIN ) 100 MG capsule [id number] Yes Take 5 capsules (500 mg total) by mouth daily at 6 (six) AM. Shahmehdi, Seyed A, MD  Active   glucose blood (ACCU-CHEK GUIDE TEST) test strip [id number] Yes USE TO CHECK BLOOD GLUCOSE UP TO 6 TIMES DAILY. McGowen, Philip H, MD  Active Spouse/Significant Other, Pharmacy Records  Guselkumab  (TREMFYA  PEN) 200 MG/2ML EMMANUEL [id number] Yes Inject 200 mg into the skin every 28 (twenty-eight) days. Starting 4 weeks after the last Tremfya  infusion which will be  around June 10, 2024 Castaneda Mayorga, Toribio, MD  Active Spouse/Significant Other, Pharmacy Records  insulin  lispro (HUMALOG  [facility]) 100 UNIT/ML KwikPen [id number] Yes 115 units with breakfast, 108 units with lunch, and 65 U at supper McGowen, Aleene DEL, MD  Active Spouse/Significant Other, Pharmacy Records  Insulin  Pen Needle (B-D UF III MINI PEN NEEDLES) 31G X 5 MM MISC [id number] Yes USE TO INJECT INSULINS EQUAL TO 6 TIMES DAILY. McGowen, Aleene DEL, MD  Active Spouse/Significant Other, Pharmacy Records  lactase (LACTAID) 3000 UNITS tablet [id number]  Take 3,000 Units by mouth as needed (when eating foods containing dairy).  [provider]  Active Spouse/Significant Other, Pharmacy Records   metolazone  (ZAROXOLYN ) 2.5 MG tablet [id number]  Take 1 tablet (2.5 mg total) by mouth daily. Willette Adriana LABOR, MD  Active   metoprolol  succinate (TOPROL -XL) 100 MG 24 hr tablet [id number] Yes TAKE 1.5 (one and one half tablet) TABLET BY MOUTH 2 TIMES DAILY. TAKE WITH OR IMMEDIATELY FOLLOWING A MEAL  Patient taking differently: Take 100 mg by mouth in the morning and at bedtime. TAKE 1.5 (one and one half tablet) TABLET BY MOUTH 2 TIMES DAILY. TAKE WITH OR IMMEDIATELY FOLLOWING A MEAL   Cleaver, Josefa HERO, NP  Active Spouse/Significant Other, Pharmacy Records  mirabegron  ER (MYRBETRIQ ) 25 MG TB24 tablet [id number]  Take 1 tablet (25 mg total) by mouth daily. McGowen, Aleene DEL, MD  Active Spouse/Significant Other, Pharmacy Records  potassium chloride  SA (KLOR-CON  M) 20 MEQ tablet [id number]  Take 1 tablet (20 mEq total) by mouth daily. Willette Adriana LABOR, MD  Active   spironolactone  (ALDACTONE ) 25 MG tablet [id number] Yes Take 0.5 tablets (12.5 mg total) by mouth daily. Willette Adriana LABOR, MD  Active   torsemide  (DEMADEX ) 20 MG tablet [id number] Yes Take 2 tablets (40 mg total) by mouth daily. Willette Adriana LABOR, MD  Active               Assessment/Plan:   Heart Failure: - Currently inappropriately managed/opportunity for optimization - Reviewed to weigh daily and when to contact cardiology with weight gain - Reviewed dietary modifications including fluid restriction - Recommend to move torsemide  total dose (40mg ) to morning (from 20mg  BID) to reduce nocturia to align with patient preference, as well as reduce risk of falls (patient uses walker and is at risk for falls)  Diabetes: - Currently uncontrolled; goal A1c <7%. Cardiorenal risk reduction has opportunities for improvement. - Advised taking insulin  5 minutes before each meal (ideally will minimize hypoglycemia) - Advised checking blood glucose twice daily, fasting and 2 hours after a meal until [place] 3 is fixed - Educated on  hypoglycemia, <70 requires testing and treating  Other recommendations: - Advised patient to dispose of old medications at local pharmacy - Advised continued use of CPAP and to call sleep doctor and/or primary care doctor if questions linger as to why this is necessary (provided education on OSA and CPAP therapy during appointment)  Follow Up Plan: attend cardiologist visit tomorrow and next scheduled visit to primary care provider. Advised patient to request printout of today's AVS at visit tomorrow.  Jon Piety, PharmD, MS, BCACP [facility] Health Pharmacist

## 2024-06-08 NOTE — Telephone Encounter (Signed)
 Patient needs to reschedule. She would like a later appointment.

## 2024-06-08 NOTE — Progress Notes (Incomplete)
 HEART & VASCULAR TRANSITION OF CARE CONSULT NOTE     Referring Physician:  PCP: Candise Aleene DEL, MD  Cardiologist: Dr. Pietro  Chief Complaint:   HPI: Referred to clinic by *** for heart failure consultation.   [name] is a 78 y.o. female with history HFpEF, Paf s/p Watchman in 4/24, CAD, T2DM, OSA on CPAP, HTN, HLD, NASH cirrhosis, and obesity.   Briefly admitted 1/25 with CHF exacerbation. She was diuresed and discharge on GDMT and Torsemide . Follows with Dr. Pietro.   Recently admitted 06/01/24 with ADHF and hypoxia in the setting of volume overload. Noted to be in AF with HR 104 bpm, 4 days prior at OP Cardiology visit. ECG in ED showed NSR. She was diuresed with IV Lasix . She was continued on her GDMT and discharged to home at a weight of 87.6 kg. Reported 06/07/24 to [facility] follow up call, that she is not taking her Torsemide .  Today She presents for transition of care visit. Overall feeling ***. NYHA ***. Reports {Symptoms; cardiac:12860::dyspnea,fatigue}. Denies {Symptoms; cardiac:12860::chest pain,dyspnea,fatigue,near-syncope,orthopnea,palpitations,dizziness,abnormal bleeding}. Able to perform ADLs. Appetite okay. Weight at home ***. BP at home***. Compliant with all medications. Denies ETOH, tobacco, or drug use.     Cardiac Testing  - Echo 2/25 showed EF 55-60% with normal RV. Diastolic parameters could not be assessed.   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 ? to get liver bx if she gets bariatric surgery? Mild portal hypertensive gastropathy on EGD 08/2019.   Fibromyalgia    GAD (generalized anxiety disorder)    GERD    History of hiatal hernia    History of iron  deficiency anemia 12/2018   Inadequate absorption secondary to chronic/long  term PPI therapy + portal hypertensive gastroduodenopathy. No GIB found on EGD, colonosc, and givens. Iron  infusions X multiple.   History of thrombocytopenia 12/2011   Hyperlipidemia    Intolerant of statins   HYPERTENSION    IBS (irritable bowel syndrome)    -D.  Good response to bentyl  and imodium  as of 06/2018 GI f/u.   IDDM (insulin  dependent diabetes mellitus)    with DPN (managed by Dr. Trixie but then in 2018 pt preferred to have me manage for her convenience)   Limited mobility    Requires a walker for arthritic pain, widespread musculoskeletal pain, and neuropathic pain.   Morbid obesity (HCC)    As of 11/2018, pt considering sleeve gastectomy vs bipass as of eval by Dr. Gael considering as of 12/2019.   Nonalcoholic steatohepatitis    Viral Hep screens NEG.  CT 2015.  Transaminasemia.  U/S 07/2018 showed early changes of cirrhosis.   OSA (obstructive sleep apnea) 09/14/2015   sleep study 09/07/15: severe obstructive sleep apnea with an AHI of 72 and SaO2 low of 75%.>referred to sleep MD   Osteoarthritis    hips, shoulders, knees   PAF (paroxysmal atrial fibrillation) (HCC)    One documented episode (after getting EGD 2016).  Was on amiodarone  x 3 mo.  Rate control with metoprolol  + anticoag with xarelto . Watchman 12/2022, off anticoag   Portal hypertensive gastropathy (HCC)    hemorrhagic gastropathy + non-bleeding gastric ulcers on EGD 08/2023   Recurrent epistaxis    Granuloma in L nare cauterized by ENT 04/2020. Another cautery 06/2020  Small fiber neuropathy    Due to DM.  Symmetric hands and feet tingling/numbness.   Ulcerative colitis (HCC)    Remicade  infusion Q 8 weeks: in clinical and endoscopic remission as of 12/2018 GI f/u.  06/11/19 rpt colonoscopy->cecal and ascending colon colitis.    Current Outpatient Medications  Medication Sig Dispense Refill   Accu-Chek Softclix Lancets lancets USE TO CHECK BLOOD GLUCOSE UP TO 6 TIMES DAILY AS DIRECTED 200 each 5    albuterol  (VENTOLIN  HFA) 108 (90 Base) MCG/ACT inhaler Inhale 2 puffs into the lungs every 6 (six) hours as needed for wheezing or shortness of breath. 8 g 0   aspirin  EC 81 MG tablet Take 1 tablet (81 mg total) by mouth daily. Swallow whole.     blood glucose meter kit and supplies KIT Use up to six times daily as directed. DX. E11.9 1 each 0   citalopram  (CELEXA ) 20 MG tablet Take 1 tablet (20 mg total) by mouth daily. 90 tablet 1   Continuous Glucose Receiver (FREESTYLE LIBRE 3 READER) DEVI Use as directed to check blood sugars 1 each 11   Continuous Glucose Sensor (FREESTYLE LIBRE 3 PLUS SENSOR) MISC Change sensor every 15 days. 2 each 3   Continuous Glucose Sensor (FREESTYLE LIBRE 3 SENSOR) MISC 1 each by Does not apply route every 14 (fourteen) days. Please apply for 14 days and then switch to new sensor 2 each 3   dapagliflozin  propanediol (FARXIGA ) 5 MG TABS tablet Take 1 tablet (5 mg total) by mouth daily. 30 tablet 1   Dulaglutide  (TRULICITY ) 1.5 MG/0.5ML SOAJ INJECT 1.5 MG (0.5ML) UNDER THE SKIN ONCE A WEEK 18 mL 3   esomeprazole  (NEXIUM ) 40 MG capsule Take 1 capsule (40 mg total) by mouth daily before breakfast. 90 capsule 3   gabapentin  (NEURONTIN ) 100 MG capsule Take 5 capsules (500 mg total) by mouth daily at 6 (six) AM. 150 capsule 0   glucose blood (ACCU-CHEK GUIDE TEST) test strip USE TO CHECK BLOOD GLUCOSE UP TO 6 TIMES DAILY. 500 strip 1   [START ON 07/08/2024] Guselkumab  (TREMFYA  PEN) 200 MG/2ML SOAJ Inject 200 mg into the skin every 28 (twenty-eight) days. Starting 4 weeks after the last Tremfya  infusion which will be around June 10, 2024 2 mL 12   insulin  lispro (HUMALOG  KWIKPEN) 100 UNIT/ML KwikPen 115 units with breakfast, 108 units with lunch, and 65 U at supper     Insulin  Pen Needle (B-D UF III MINI PEN NEEDLES) 31G X 5 MM MISC USE TO INJECT INSULINS EQUAL TO 6 TIMES DAILY. 500 each 6   lactase (LACTAID) 3000 UNITS tablet Take 3,000 Units by mouth as needed (when eating  foods containing dairy).      metolazone  (ZAROXOLYN ) 2.5 MG tablet Take 1 tablet (2.5 mg total) by mouth daily. 30 tablet 1   metoprolol  succinate (TOPROL -XL) 100 MG 24 hr tablet TAKE 1.5 (one and one half tablet) TABLET BY MOUTH 2 TIMES DAILY. TAKE WITH OR IMMEDIATELY FOLLOWING A MEAL 180 tablet 1   mirabegron  ER (MYRBETRIQ ) 25 MG TB24 tablet Take 1 tablet (25 mg total) by mouth daily. 30 tablet 3   potassium chloride  SA (KLOR-CON  M) 20 MEQ tablet Take 1 tablet (20 mEq total) by mouth daily. 30 tablet 1   spironolactone  (ALDACTONE ) 25 MG tablet Take 0.5 tablets (12.5 mg total) by mouth daily. 30 tablet 1   torsemide  (DEMADEX ) 20 MG tablet Take 2 tablets (40 mg total) by mouth daily. (Patient not taking: Reported  on 06/07/2024) 90 tablet 0   Current Facility-Administered Medications  Medication Dose Route Frequency Provider Last Rate Last Admin   acetaminophen  (TYLENOL ) tablet 650 mg  650 mg Oral Once Eartha Flavors, Toribio, MD       diphenhydrAMINE  (BENADRYL ) capsule 25 mg  25 mg Oral Once Eartha Flavors Toribio, MD        Allergies  Allergen Reactions   Flagyl  [Metronidazole  Hcl] Other (See Comments)    Diaphoresis    Omeprazole  Anaphylaxis and Swelling    Tongue and throat swelling   Pioglitazone Other (See Comments) and Swelling    Weight gain, tongue swelling   Allevyn Adhesive [Wound Dressings] Other (See Comments)    Unknown   Benzocaine-Menthol Swelling    Mouth swelling   Lactose Diarrhea and Other (See Comments)    Gas, bloating   Lactose Intolerance (Gi) Diarrhea    Gas, bloating   Metformin  And Related Diarrhea   Shrimp [Shellfish Allergy] Itching    Throat and ear itching, if consumed raw   Statins Palpitations   Welchol [Colesevelam] Other (See Comments)    GI upset   Adhesive [Tape] Other (See Comments)    Skin irritation and bruising    Desipramine  Hcl Itching, Nausea Only and Other (See Comments)    Swimmy headed   Hydromorphone  Itching   Jardiance   [Empagliflozin ] Other (See Comments)    Weakness    Nisoldipine Itching   Percocet [Oxycodone -Acetaminophen ] Itching   Social History   Socioeconomic History   Marital status: Married    Spouse name: Not on file   Number of children: Not on file   Years of education: Not on file   Highest education level: Not on file  Occupational History   Occupation: Retired  Tobacco Use   Smoking status: Never   Smokeless tobacco: Never  Vaping Use   Vaping status: Never Used  Substance and Sexual Activity   Alcohol use: Not Currently    Comment: ocassionally   Drug use: No   Sexual activity: Not Currently    Partners: Male    Birth control/protection: Surgical    Comment: hysterectomy  Other Topics Concern   Not on file  Social History Narrative   She lives with husband in [place] home.  They have one grown son and 2 grandchildren.   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   Highest of level education:  Some college.   Never smoker.   Alcohol: rare.   Social Drivers of Corporate investment banker Strain: Low Risk  (01/21/2024)   Overall Financial Resource Strain (CARDIA)    Difficulty of Paying Living Expenses: Not hard at all  Food Insecurity: No Food Insecurity (06/07/2024)   Hunger Vital Sign    Worried About Running Out of Food in the Last Year: Never true    Ran Out of Food in the Last Year: Never true  Transportation Needs: No Transportation Needs (06/07/2024)   PRAPARE - Administrator, Civil Service (Medical): No    Lack of Transportation (Non-Medical): No  Physical Activity: Inactive (01/21/2024)   Exercise Vital Sign    Days of Exercise per Week: 0 days    Minutes of Exercise per Session: 0 min  Stress: No Stress Concern Present (01/21/2024)   [employer] of Occupational Health - Occupational Stress Questionnaire    Feeling of Stress : Not at all  Social Connections: Moderately Isolated (06/01/2024)   Social Connection and Isolation Panel    Frequency of  Communication with Friends and Family: More than three times a week    Frequency of Social Gatherings with Friends and Family: Once a week    Attends Religious Services: Never    Database administrator or Organizations: No    Attends Banker Meetings: Never    Marital Status: Married  Catering manager Violence: Not At Risk (06/01/2024)   Humiliation, Afraid, Rape, and Kick questionnaire    Fear of Current or Ex-Partner: No    Emotionally Abused: No    Physically Abused: No    Sexually Abused: No    Family History  Problem Relation Age of Onset   Diabetes Mother    Hypertension Mother    Heart attack Father        Mid 75's   Heart disease Father    Lung disease Father        spot on lung; had lung surgery   Alcohol abuse Other    Hypertension Son    Diabetes Son    Emphysema Maternal Grandfather    Asthma Maternal Grandfather    Colon cancer Paternal Grandfather    Stomach cancer Paternal Grandmother    Neuropathy Neg Hx    There were no vitals filed for this visit.  PHYSICAL EXAM: General: Well appearing. No distress on RA Cardiac: JVP ***. S1 and S2 present. No murmurs or rub. Resp: Lung sounds clear and equal B/L Abdomen: Soft, non-tender, non-distended.  Extremities: Warm and dry.  *** edema.  Neuro: Alert and oriented x3. Affect pleasant. Moves all extremities without difficulty.  ECG:  ASSESSMENT & PLAN:  Chronic HFpEF - Most recent echo 2/25 with EF 55-60% and nl RV - Recent admission for hypervolemia and hypoxia - Exacerbation preceeded by episode of AF, concerned may have contributed to overload ??? - NYHA *** Volume *** - continue Farxiga  10 mg daily - continue spiro 12.5 mg daily - continue torsemide  40 mg daily ***  2. Paroxysmal Atrial Fibrillation - s/p Watchman in 2024 - noted in AF at 104 bpm on ECG from cardiology visit 05/28/24  - ECG *** -   3. AKI - Cr 1.45 at time of discharge - BMET today    Referred to HFSW (PCP,  Medications, Transportation, ETOH Abuse, Drug Abuse, Insurance, Financial ): Yes or No Refer to Pharmacy: Yes or No Refer to Home Health: Yes on No Refer to Advanced Heart Failure Clinic: Yes or no  Refer to General Cardiology: Yes or No  Follow up with [facility], patient of Dr. Crenshaw   [place] Darcell Yacoub, NP 06/08/24

## 2024-06-08 NOTE — Progress Notes (Signed)
 Care Guide Pharmacy Note  06/08/2024 Name: [name] MRN: [id number] DOB: 11-Aug-1946  Referred By: Candise Aleene DEL, MD Reason for referral: Complex Care Management (Initial outreach to schedule referral with PharmD )   [name] is a 78 y.o. year old female who is a primary care patient of McGowen, Aleene DEL, MD.  [name] was referred to the pharmacist for assistance related to: CHF and DMII   Successful contact was made with the patient to discuss pharmacy services including being ready for the pharmacist to call at least 5 minutes before the scheduled appointment time and to have medication bottles and any blood pressure readings ready for review. The patient agreed to meet with the pharmacist via telephone visit on (date/time).9/9 10:00 AM   Harlene Satterfield  [facility], [facility] Guide  Direct Dial : [phone]  Fax [phone]

## 2024-06-09 ENCOUNTER — Encounter (HOSPITAL_COMMUNITY)

## 2024-06-09 ENCOUNTER — Telehealth: Payer: Self-pay | Admitting: Pharmacist

## 2024-06-09 ENCOUNTER — Telehealth: Payer: Self-pay

## 2024-06-09 ENCOUNTER — Telehealth (HOSPITAL_COMMUNITY): Payer: Self-pay

## 2024-06-09 ENCOUNTER — Other Ambulatory Visit (INDEPENDENT_AMBULATORY_CARE_PROVIDER_SITE_OTHER): Payer: Self-pay | Admitting: Gastroenterology

## 2024-06-09 ENCOUNTER — Ambulatory Visit

## 2024-06-09 DIAGNOSIS — K51919 Ulcerative colitis, unspecified with unspecified complications: Secondary | ICD-10-CM

## 2024-06-09 MED ORDER — SODIUM CHLORIDE 0.9 % IV SOLN: 200.0000 mg | Freq: Once | INTRAVENOUS | Status: DC

## 2024-06-09 NOTE — Telephone Encounter (Signed)
 Patient rescheduled the 06/09/2024 appointment for her third and last infusion of Tremfya  and has rescheduled to 06/16/2024. I will have to send the maintenance dose in after 06/16/2024.

## 2024-06-09 NOTE — Telephone Encounter (Signed)
 Patient was given a prednisone  taper course, no refill is warranted.

## 2024-06-09 NOTE — Telephone Encounter (Signed)
 Patient had phone visit with transitions of care pharmacist yesterday. Her note was forwarded to me for follow up if needed.  Reviewed note. I saw that patient was not using her [facility] Chronic Care Management system. Her husband states that they use a reader and that neither of the 2 they have at home will connect with the sensors she has.  Spoke with Mr. Bangert. I will plan to meet them in Dr Lizzie office Monday 9/15 to provide education and if needed a new reader for the [place] 3 + sensor.

## 2024-06-09 NOTE — Telephone Encounter (Signed)
 Called to confirm/remind patient of their appointment at the [facility] on 06/10/2024 2:45.   Appointment:   [x] Confirmed  [] Left mess   [] No answer/No voice mail  [] VM Full/unable to leave message  [] Phone not in service  Patient reminded to bring all medications and/or complete list.  Confirmed patient has transportation. Gave directions, instructed to utilize valet parking.

## 2024-06-09 NOTE — Progress Notes (Signed)
  Patient no showed

## 2024-06-09 NOTE — Telephone Encounter (Signed)
 Spoke with pt's husband, they found both medications. No further action needed.

## 2024-06-09 NOTE — Telephone Encounter (Signed)
 Copied from CRM [id number]. Topic: General - Other >> Jun 09, 2024  8:43 AM Rosina BIRCH wrote: Reason for CRM: heron from [place] called stating the patient medications-tresiba  and sulfamethoxazole  were missing from her discharge and the patient is having blurred vision that might be related to the medication [phone] ext [phone]

## 2024-06-10 ENCOUNTER — Telehealth: Payer: Self-pay

## 2024-06-10 ENCOUNTER — Inpatient Hospital Stay (HOSPITAL_COMMUNITY)
Admission: RE | Admit: 2024-06-10 | Discharge: 2024-06-10 | Disposition: A | Discharge status: Home / Self Care | Source: Ambulatory Visit

## 2024-06-10 DIAGNOSIS — I5030 Unspecified diastolic (congestive) heart failure: Secondary | ICD-10-CM

## 2024-06-10 NOTE — Telephone Encounter (Signed)
 Copied from CRM [id number]. Topic: Clinical - Home Health Verbal Orders >> Jun 04, 2024  2:41 PM Roselie BROCKS wrote: [facility]  Callback Number: [phone] Service Requested: Physical Therapy Frequency: Thursday sept 11, 2025 Any new concerns about the patient? No >> Jun 10, 2024  1:00 PM [place] J wrote: Veleria calling from [facility] to let us  know that there has been a delay in care. Patient has requested that services are pushed out to next Tuesday (06/15/24)   Okay to delay start of care?

## 2024-06-11 NOTE — Telephone Encounter (Signed)
 Dayneisha advised provider aware of delay for care, approval given.

## 2024-06-11 NOTE — Telephone Encounter (Signed)
 Yes okay

## 2024-06-14 ENCOUNTER — Inpatient Hospital Stay: Admitting: Family Medicine

## 2024-06-14 ENCOUNTER — Other Ambulatory Visit

## 2024-06-14 ENCOUNTER — Encounter: Payer: Self-pay | Admitting: Family Medicine

## 2024-06-15 ENCOUNTER — Other Ambulatory Visit: Payer: Self-pay | Admitting: Pharmacist

## 2024-06-15 ENCOUNTER — Telehealth: Payer: Self-pay

## 2024-06-15 ENCOUNTER — Other Ambulatory Visit: Payer: Self-pay

## 2024-06-15 DIAGNOSIS — I5033 Acute on chronic diastolic (congestive) heart failure: Secondary | ICD-10-CM | POA: Diagnosis not present

## 2024-06-15 DIAGNOSIS — M47814 Spondylosis without myelopathy or radiculopathy, thoracic region: Secondary | ICD-10-CM | POA: Diagnosis not present

## 2024-06-15 DIAGNOSIS — E1142 Type 2 diabetes mellitus with diabetic polyneuropathy: Secondary | ICD-10-CM | POA: Diagnosis not present

## 2024-06-15 DIAGNOSIS — I480 Paroxysmal atrial fibrillation: Secondary | ICD-10-CM | POA: Diagnosis not present

## 2024-06-15 DIAGNOSIS — J9811 Atelectasis: Secondary | ICD-10-CM | POA: Diagnosis not present

## 2024-06-15 DIAGNOSIS — J9601 Acute respiratory failure with hypoxia: Secondary | ICD-10-CM | POA: Diagnosis not present

## 2024-06-15 DIAGNOSIS — M160 Bilateral primary osteoarthritis of hip: Secondary | ICD-10-CM | POA: Diagnosis not present

## 2024-06-15 DIAGNOSIS — I700 Atherosclerosis of aorta: Secondary | ICD-10-CM | POA: Diagnosis not present

## 2024-06-15 DIAGNOSIS — I110 Hypertensive heart disease with heart failure: Secondary | ICD-10-CM | POA: Diagnosis not present

## 2024-06-15 NOTE — Progress Notes (Signed)
06/15/2024 Name: [name] MRN: [id number] DOB: 1946/09/26  Chief Complaint  Patient presents with   Diabetes   Medication Management    [name] is a 78 y.o. year old female who sees Dr Aleene Hockey, Clinical Pharmacist met with patient in her home today for a face to face visit.    They were referred to the pharmacist by the [facility] Transitions of Care program for assistance in managing complex medication management    Subjective:  Care Team: Primary Care Provider: Hockey Aleene DEL, MD ; Next Scheduled Visit: 06/22/2024 OBGYN Dr Cleotilde; Next Scheduled Visit: 09/15/2024 Gastroenterologist: Dr Eartha Flavors; Next Scheduled Visit: 07/04/2024  Medication Access/Adherence  Current Pharmacy:  [id number] [place] - [address] [place] [address] Phone: [phone] Fax: [phone]  [facility] - [place], [place] - [address] [place] [address] Phone: [phone] Fax: [phone]  [name] [address] [place] [address] Phone: [phone] Fax: [phone]   Patient reports affordability concerns with their medications: No  Patient reports access/transportation concerns to their pharmacy: No  Patient reports adherence concerns with their medications:  Yes  - she has not been able to use her Continuous Glucose Monitor - Libre 3. Husband states the reader is not working.  Her husband [name] has taken over administering her medications. Using a weekly pill box.   Patient was hospitalized 06/01/2024 to 06/03/2024 - Presented with shortness of breath and orthopnea for 2 days. BNP was noted to be elevated.  During hospital course she was noted to have acute respirator failure ane hypokaliemia.   Medications stopped at discharge: furosemide  20mg  Medication changes at discharge:  gabpentin 600mg  changed to  gabapentin  100mg  take 5 capsules = 500mg  at 6am Medications started at discharge: torsemide  20mg  - take 2 tablets = 40mg  once a day   Diabetes:  Current medications:  Humalog  U100 - inject 115 units after breakfast; 108 at lunch and 65 at dinner Trulicity  1.5mg  once weekly  Farxiga  5mg  daily  Medications tried in the past:  metformin , Jardiance , and pioglitazone, Invokana , glipizide , Victoza , U500, Tresiba  - discontinued 05/03/2024, Levemir , Lantus , Semglee , Victoza .  Current glucose readings: none to report. Patient is actually wearing a Libre 14 day sensor. She has a 14 day reader but it does not work. She has 2 Libre 3 readers but no Liber 3 or 3 plus sensors. Tried charging her [place]rie 14 day reader but reader still is not working.    Patient reports hypoglycemic s/sx including dizziness, shakiness, sweating. Patient denies hyperglycemic symptoms including no polyuria, polydipsia, polyphagia, nocturia, neuropathy, blurred vision.   Current medication access support: none  Macrovascular and Microvascular Risk Reduction:  Statin? no; patient previously intolerant to statin therapy; ACEi/ARB? No - patient is taking Farxiga  daily. Due to recheck urine albumin: Creatinine ratio. Last urinary albumin/creatinine ratio:  Lab Results  Component Value Date   MICRALBCREAT 1.0 05/29/2010   Last eye exam:  Lab Results  Component Value Date   HMDIABEYEEXA No Retinopathy 05/28/2023   Last foot exam: 02/12/2012 Tobacco Use:  Tobacco Use: Low Risk  (06/01/2024)   Patient History    Smoking Tobacco Use: Never    Smokeless Tobacco Use: Never    Passive Exposure: Not on file    BP Readings from Last 3 Encounters:  06/03/24 109/73  05/28/24 108/74  05/26/24 134/84  Objective:  Lab Results  Component Value Date   HGBA1C 6.8 (A) 05/03/2024   HGBA1C 6.8 05/03/2024   HGBA1C 6.8 (A) 05/03/2024   HGBA1C 6.8 05/03/2024    Lab Results  Component Value Date   CREATININE 1.45 (H)  06/03/2024   BUN 20 06/03/2024   NA 135 06/03/2024   K 3.6 06/03/2024   CL 91 (L) 06/03/2024   CO2 32 06/03/2024    Lab Results  Component Value Date   CHOL 149 05/06/2023   HDL 38 (L) 05/06/2023   LDLCALC 89 05/06/2023   LDLDIRECT 69.5 01/24/2011   TRIG 123 05/06/2023   CHOLHDL 3.9 05/06/2023    Medications Reviewed Today     Reviewed by Carla Milling, RPH-CPP (Pharmacist) on 06/15/24 at 1309  Med List Status: <None>   Medication Order Taking? Sig Documenting Provider Last Dose Status Informant  Accu-Chek Softclix Lancets lancets [id number]  USE TO CHECK BLOOD GLUCOSE UP TO 6 TIMES DAILY AS DIRECTED McGowen, Aleene DEL, MD  Active Spouse/Significant Other, Pharmacy Records  acetaminophen  (TYLENOL ) tablet 650 mg [id number]   Castaneda Mayorga, Daniel, MD  Active   albuterol  (VENTOLIN  HFA) 108 [phone] Base) MCG/ACT inhaler [id number]  Inhale 2 puffs into the lungs every 6 (six) hours as needed for wheezing or shortness of breath. McGowen, Philip H, MD  Active Spouse/Significant Other, Pharmacy Records  aspirin  EC 81 MG tablet [id number]  Take 1 tablet (81 mg total) by mouth daily. Swallow whole. Arvil Kate BIRCH, NP  Active Spouse/Significant Other, Pharmacy Records  blood glucose meter kit and supplies KIT [id number]  Use up to six times daily as directed. DX. E11.9 McGowen, Aleene DEL, MD  Active Spouse/Significant Other, Pharmacy Records    Discontinued 12/05/11 1452 (Completed Course)   citalopram  (CELEXA ) 20 MG tablet [id number]  Take 1 tablet (20 mg total) by mouth daily. McGowen, Aleene DEL, MD  Active Spouse/Significant Other, Pharmacy Records  Continuous Glucose Receiver (FREESTYLE LIBRE 3 READER) DEVI [id number]  Use as directed to check blood sugars Lowella, Benton CROME, PA  Active Spouse/Significant Other, Pharmacy Records  Continuous Glucose Sensor (FREESTYLE LIBRE 3 PLUS SENSOR) MISC [id number]  Change sensor every 15 days. McGowen, Philip H, MD  Active Spouse/Significant Other, Pharmacy  Records    Discontinued 06/15/24 1308 (Duplicate) dapagliflozin  propanediol (FARXIGA ) 5 MG TABS tablet [id number]  Take 1 tablet (5 mg total) by mouth daily. Crain, Whitney L, PA  Active Spouse/Significant Other, Pharmacy Records  diphenhydrAMINE  (BENADRYL ) capsule 25 mg [id number]   Castaneda Mayorga, Toribio, MD  Active   Dulaglutide  (TRULICITY ) 1.5 MG/0.5ML EMMANUEL [id number]  INJECT 1.5 MG (0.5ML) UNDER THE SKIN ONCE A WEEK McGowen, Aleene DEL, MD  Active Spouse/Significant Other, Pharmacy Records  esomeprazole  (NEXIUM ) 40 MG capsule [id number]  Take 1 capsule (40 mg total) by mouth daily before breakfast. Eartha Flavors, Toribio, MD  Active Spouse/Significant Other, Pharmacy Records  gabapentin  (NEURONTIN ) 100 MG capsule [id number]  Take 5 capsules (500 mg total) by mouth daily at 6 (six) AM. Shahmehdi, Seyed A, MD  Active   glucose blood (ACCU-CHEK GUIDE TEST) test strip [id number]  USE TO CHECK BLOOD GLUCOSE UP TO 6 TIMES DAILY. McGowen, Aleene DEL, MD  Active Spouse/Significant Other, Pharmacy Records  Guselkumab  (TREMFYA  PEN) 200 MG/2ML SOAJ [id number]  Inject 200 mg into the skin every 28 (twenty-eight) days. Starting 4 weeks after the last Tremfya  infusion which will be around June 10, 2024 Eartha Flavors Toribio, MD  Active Spouse/Significant Other, Pharmacy Records  insulin  lispro (HUMALOG  [facility]) 100  UNIT/ML KwikPen [id number]  115 units with breakfast, 108 units with lunch, and 65 U at supper McGowen, Aleene DEL, MD  Active Spouse/Significant Other, Pharmacy Records  Insulin  Pen Needle (B-D UF III MINI PEN NEEDLES) 31G X 5 MM MISC [id number]  USE TO INJECT INSULINS EQUAL TO 6 TIMES DAILY. McGowen, Aleene DEL, MD  Active Spouse/Significant Other, Pharmacy Records  lactase (LACTAID) 3000 UNITS tablet [id number]  Take 3,000 Units by mouth as needed (when eating foods containing dairy).  [provider]  Active Spouse/Significant Other, Pharmacy Records  metolazone  (ZAROXOLYN ) 2.5 MG tablet  [id number]  Take 1 tablet (2.5 mg total) by mouth daily. Willette Adriana LABOR, MD  Active   metoprolol  succinate (TOPROL -XL) 100 MG 24 hr tablet [id number]  TAKE 1.5 (one and one half tablet) TABLET BY MOUTH 2 TIMES DAILY. TAKE WITH OR IMMEDIATELY FOLLOWING A MEAL  Patient taking differently: Take 100 mg by mouth in the morning and at bedtime. TAKE 1.5 (one and one half tablet) TABLET BY MOUTH 2 TIMES DAILY. TAKE WITH OR IMMEDIATELY FOLLOWING A MEAL   Cleaver, Josefa HERO, NP  Active Spouse/Significant Other, Pharmacy Records  mirabegron  ER (MYRBETRIQ ) 25 MG TB24 tablet [id number]  Take 1 tablet (25 mg total) by mouth daily. McGowen, Aleene DEL, MD  Active Spouse/Significant Other, Pharmacy Records  potassium chloride  SA (KLOR-CON  M) 20 MEQ tablet [id number]  Take 1 tablet (20 mEq total) by mouth daily. Willette Adriana LABOR, MD  Active   spironolactone  (ALDACTONE ) 25 MG tablet [id number]  Take 0.5 tablets (12.5 mg total) by mouth daily. Willette Adriana LABOR, MD  Active   torsemide  (DEMADEX ) 20 MG tablet [id number]  Take 2 tablets (40 mg total) by mouth daily. Willette Adriana LABOR, MD  Active               Assessment/Plan:   Diabetes: - Currently controlled; goal A1c <7%. Cardiorenal risk reduction is opportunities for improvement.. Blood pressure is at goal <130/80. LDL is at goal.  - Reviewed goal A1c, goal fasting, and goal 2 hour post prandial glucose. Recommended to check glucose with Libre 3 plus sensor. Education provided on which reader to use. Recommended they only use Libre 3 plus sensor. [facility] only [facility] for the [place] 3 plus sensors. Provided #2 sensors during home visit. Assisted patient with placement of Libre 3 plus sensor today (removed the 14 day sensor she was wearing) and helped start sensor with the correct reader - Recommend to continue current regimen - Humalog , Farxiga  and Trulicity ; If blood glucose not at goals in future could increase Trulicity , consider  adding back basal insulin  and adjusting bolus insulin  as needed.  . - Consider checking UACR at next weeks visit with PCP.   Follow Up Plan: 1 week - follow up with PCP ; 2 weeks Clinical Pharmacist will follow up blood glucose and medication adherence.   Madelin Ray, PharmD Clinical Pharmacist [facility]  Population Health [phone]

## 2024-06-15 NOTE — Patient Instructions (Signed)
 Visit Information  Thank you for taking time to visit with me today. Please don't hesitate to contact me if I can be of assistance to you before our next scheduled telephone appointment.  Our next appointment is by telephone on 06/23/2024 at 1pm  Following is a copy of your care plan:   Goals Addressed             This Visit's Progress    [facility] Transitions of Care (TOC) Care Plan       Problems:  Recent Hospitalization for treatment of admission for heart failure and respiratory distress  06/15/2024 Patient reports that she is breathing better. Reports no swelling today.  Reports that she is tired from having PT today.  Knowledge Deficit Related to management olf heart failure, Medication management barrier  missing medications , and No Hospital Follow Up Provider appointment no PCP hospital follow up  06/15/2024  Patient reports that she has her medications and is taking as prescribed, Reports that her Continous blood glucose monitor is working well. Reports that she did not go to PCP or CHF clinic appointment last week. No weights  06/15/2024 no weight today but reports yesterday weight of 198 pounds Continuous glucose monitoring not working.  06/15/2024  reports CBG working now and todays reading of 150.   Goal:  Over the next 30 days, the patient will not experience hospital readmission  Interventions:  Transitions of Care: Doctor Visits  - discussed the importance of doctor visits Confirmed active with PT  Encouraged patient and husband to meet with pharmacist to review medications.  Encouraged patient to attend her appointments ( PCP and CHF clinic). Reviewed appointment dates and time with patient.  Encouraged patient to do home exercises as per PT instructions.  Assessed pain and swelling   Heart Failure Interventions: Basic overview and discussion of pathophysiology of Heart Failure reviewed Provided education on low sodium diet Reviewed Heart Failure Action Plan in  depth and provided verbal guidelines and referred patient and husband to reviewed AVS with written guidelines of when to call MD for weight gain.  Assessed need for readable accurate scales in home Provided education about placing scale on hard, flat surface Advised patient to weigh each morning after emptying bladder Discussed importance of daily weight and advised patient to weigh and record daily Reviewed role of diuretics in prevention of fluid overload and management of heart failure; Discussed the importance of keeping all appointments with providers Long discussion with husband about heart failure management, DM management and medications.  Reviewed importance of daily CHF management to avoid readmissions.   Patient Self Care Activities: Weigh daily and record weights Monitor CBG 3 times a day Take medications as prescribed Call MD office for changes in condition Review discharge instructions again on your own. Use your inhaler as prescribed.  Take you insulin  Go to her heart failure appointment on 06/09/2024   Follow low salt diet. Do your home exercises when PT starts ( 06/10/2024)  Plan:  Telephone follow up appointment with care management team member scheduled for:  06/23/2024 at 1pm The patient has been provided with contact information for the care management team and has been advised to call with any health related questions or concerns.         Patient verbalizes understanding of instructions and care plan provided today and agrees to view in MyChart. Active MyChart status and patient understanding of how to access instructions and care plan via MyChart confirmed with patient.  Telephone follow up appointment with care management team member scheduled for:  06/23/2024 at 1pm  Please call the care guide team at [phone] if you need to cancel or reschedule your appointment.   Please call the Suicide and Crisis Lifeline: 988 call the [place]  National Suicide  Prevention Lifeline: (443) 653-3959 or TTY: 661-859-7344 TTY 908-161-1935) to talk to a trained counselor call 1-800-273-TALK (toll free, 24 hour hotline) call 911 if you are experiencing a Mental Health or Behavioral Health Crisis or need someone to talk to.  Alan Ee, RN, BSN, CEN [employer]- Transition of Care Team.  [facility] [phone]

## 2024-06-15 NOTE — Telephone Encounter (Signed)
 Copied from CRM [id number]. Topic: Clinical - Home Health Verbal Orders >> Jun 15, 2024  2:16 PM Burnard DEL wrote: Caller/Agency: Morna Rushing Number: [phone] Service Requested: Physical Therapy Frequency: 2wk 2 1wk5 Any new concerns about the patient? Yes There is a level 2 interaction with patient medications potassium chloride  SA (KLOR-CON  M) 20 MEQ tablet and spironolactone  (ALDACTONE ) 25 MG tablet

## 2024-06-15 NOTE — Transitions of Care (Post Inpatient/ED Visit) (Signed)
 Transition of Care week 2  Visit Note  06/15/2024  Name: [name] MRN: [id number]          DOB: May 28, 1946  Situation: Patient enrolled in [facility] 30-day program. Visit completed with patient by telephone.   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 ? to get liver bx if she gets bariatric surgery? Mild portal hypertensive gastropathy on EGD 08/2019.   Fibromyalgia    GAD (generalized anxiety disorder)    GERD    History of hiatal hernia    History of iron  deficiency anemia 12/2018   Inadequate absorption secondary to chronic/long term PPI therapy + portal hypertensive gastroduodenopathy. No GIB found on EGD, colonosc, and givens. Iron  infusions X multiple.   History of thrombocytopenia 12/2011   Hyperlipidemia    Intolerant of statins   HYPERTENSION    IBS (irritable bowel syndrome)    -D.  Good response to bentyl  and imodium  as of 06/2018 GI f/u.   IDDM (insulin  dependent diabetes mellitus)    with DPN (managed by Dr. Trixie but then in 2018 pt preferred to have me manage for her convenience)   Limited mobility    Requires a walker for arthritic pain, widespread musculoskeletal pain, and neuropathic pain.   Morbid obesity (HCC)    As of 11/2018, pt considering sleeve gastectomy vs bipass as of eval by Dr. Gael considering as of 12/2019.   Nonalcoholic steatohepatitis    Viral Hep screens NEG.  CT 2015.  Transaminasemia.  U/S 07/2018 showed early changes of cirrhosis.   OSA (obstructive sleep apnea) 09/14/2015   sleep study 09/07/15: severe obstructive sleep apnea with an AHI of 72 and SaO2 low of 75%.>referred to sleep MD   Osteoarthritis    hips, shoulders, knees   PAF (paroxysmal atrial  fibrillation) (HCC)    One documented episode (after getting EGD 2016).  Was on amiodarone  x 3 mo.  Rate control with metoprolol  + anticoag with xarelto . Watchman 12/2022, off anticoag   Portal hypertensive gastropathy (HCC)    hemorrhagic gastropathy + non-bleeding gastric ulcers on EGD 08/2023   Recurrent epistaxis    Granuloma in L nare cauterized by ENT 04/2020. Another cautery 06/2020   Small fiber neuropathy    Due to DM.  Symmetric hands and feet tingling/numbness.   Ulcerative colitis (HCC)    Remicade  infusion Q 8 weeks: in clinical and endoscopic remission as of 12/2018 GI f/u.  06/11/19 rpt colonoscopy->cecal and ascending colon colitis.    Assessment: reports breathing better, Denies any swelling. Sounds sluggish on the phone.  Patient Reported Symptoms: Cognitive Cognitive Status: Confused or disoriented, Difficulties with attention and concentration, Other:, Struggling with memory recall (slow speech. sounds very sleepy on the phone.)      Neurological Neurological Review of Symptoms: No symptoms reported    HEENT HEENT Symptoms Reported: No symptoms reported      Cardiovascular Cardiovascular Symptoms Reported: Other:, Fatigue Other Cardiovascular Symptoms: denies any swelling today. Did not weigh today. Does patient have uncontrolled Hypertension?: No Cardiovascular Management Strategies: Medication therapy Weight: 198 lb (89.8 kg) (home monitoring weight on 06/14/2024) Cardiovascular Self-Management Outcome: 3 (uncertain) Cardiovascular Comment: Encouraged patient to weigh daily and take her medications as prescribed.  Respiratory  Respiratory Symptoms Reported: No symptoms reported    Endocrine Endocrine Symptoms Reported: No symptoms reported Is patient diabetic?: Yes Is patient checking blood sugars at home?: Yes List most recent blood sugar readings, include date and time of day: during call 150. Reports continous blood glucose monitor is working. Endocrine  Self-Management Outcome: 4 (good)  Gastrointestinal Gastrointestinal Symptoms Reported: No symptoms reported      Genitourinary Genitourinary Symptoms Reported: Incontinence, Frequency Additional Genitourinary Details: Reports  I am going to the BR too muc  reviewed this is from the fluid pill and is what is instructed by MD. Genitourinary Management Strategies: Medication therapy, Medical device  Integumentary Integumentary Symptoms Reported: No symptoms reported    Musculoskeletal Musculoskelatal Symptoms Reviewed: Weakness, Difficulty walking Additional Musculoskeletal Details: active with PT. Had a session today Musculoskeletal Management Strategies: Medical device, Routine screening, Exercise      Psychosocial Psychosocial Symptoms Reported: Other Other Psychosocial Conditions: sounds very sluggish on the phone.  Wondered if patient is falling asleep during call. Unable to tell.         Today's Vitals   06/15/24 1316 06/15/24 1318  Weight: 198 lb (89.8 kg)   PainSc:  4      Medications Reviewed Today     Reviewed by Rumalda Alan PENNER, RN (Registered Nurse) on 06/15/24 at 1308  Med List Status: <None>   Medication Order Taking? Sig Documenting Provider Last Dose Status Informant  Accu-Chek Softclix Lancets lancets [id number] Yes USE TO CHECK BLOOD GLUCOSE UP TO 6 TIMES DAILY AS DIRECTED McGowen, Aleene DEL, MD  Active Spouse/Significant Other, Pharmacy Records  acetaminophen  (TYLENOL ) tablet 650 mg [id number]   Castaneda Mayorga, Daniel, MD  Active   albuterol  (VENTOLIN  HFA) 108 [id number] Base) MCG/ACT inhaler [id number] Yes Inhale 2 puffs into the lungs every 6 (six) hours as needed for wheezing or shortness of breath. McGowen, Philip H, MD  Active Spouse/Significant Other, Pharmacy Records  aspirin  EC 81 MG tablet [id number] Yes Take 1 tablet (81 mg total) by mouth daily. Swallow whole. Arvil Kate BIRCH, NP  Active Spouse/Significant Other, Pharmacy Records  blood glucose meter kit and  supplies KIT [id number] Yes Use up to six times daily as directed. DX. E11.9 McGowen, Aleene DEL, MD  Active Spouse/Significant Other, Pharmacy Records    Discontinued 12/05/11 1452 (Completed Course)   citalopram  (CELEXA ) 20 MG tablet [id number] Yes Take 1 tablet (20 mg total) by mouth daily. McGowen, Aleene DEL, MD  Active Spouse/Significant Other, Pharmacy Records  Continuous Glucose Receiver (FREESTYLE LIBRE 3 READER) DEVI [id number] Yes Use as directed to check blood sugars Lowella, Benton CROME, PA  Active Spouse/Significant Other, Pharmacy Records  Continuous Glucose Sensor (FREESTYLE LIBRE 3 PLUS SENSOR) MISC [id number] Yes Change sensor every 15 days. McGowen, Philip H, MD  Active Spouse/Significant Other, Pharmacy Records    Discontinued 06/15/24 1308 (Duplicate) dapagliflozin  propanediol (FARXIGA ) 5 MG TABS tablet [id number] Yes Take 1 tablet (5 mg total) by mouth daily. Crain, Whitney L, PA  Active Spouse/Significant Other, Pharmacy Records  diphenhydrAMINE  (BENADRYL ) capsule 25 mg [id number]   Castaneda Mayorga, Toribio, MD  Active   Dulaglutide  (TRULICITY ) 1.5 MG/0.5ML EMMANUEL [id number] Yes INJECT 1.5 MG (0.5ML) UNDER THE SKIN ONCE A WEEK McGowen, Aleene DEL, MD  Active Spouse/Significant Other, Pharmacy Records  esomeprazole  (NEXIUM ) 40 MG capsule [id number] Yes Take 1 capsule (40 mg total) by mouth daily before breakfast. Castaneda Mayorga, Daniel, MD  Active Spouse/Significant Other, Pharmacy Records  gabapentin  (NEURONTIN ) 100 MG capsule [id number] Yes Take 5 capsules (500  mg total) by mouth daily at 6 (six) AM. Shahmehdi, Seyed A, MD  Active   glucose blood (ACCU-CHEK GUIDE TEST) test strip [id number] Yes USE TO CHECK BLOOD GLUCOSE UP TO 6 TIMES DAILY. McGowen, Philip H, MD  Active Spouse/Significant Other, Pharmacy Records  Guselkumab  (TREMFYA  PEN) 200 MG/2ML EMMANUEL [id number] Yes Inject 200 mg into the skin every 28 (twenty-eight) days. Starting 4 weeks after the last Tremfya  infusion which will be around  June 10, 2024 Castaneda Mayorga, Toribio, MD  Active Spouse/Significant Other, Pharmacy Records  insulin  lispro (HUMALOG  [facility]) 100 UNIT/ML KwikPen [id number] Yes 115 units with breakfast, 108 units with lunch, and 65 U at supper McGowen, Aleene DEL, MD  Active Spouse/Significant Other, Pharmacy Records  Insulin  Pen Needle (B-D UF III MINI PEN NEEDLES) 31G X 5 MM MISC [id number] Yes USE TO INJECT INSULINS EQUAL TO 6 TIMES DAILY. McGowen, Aleene DEL, MD  Active Spouse/Significant Other, Pharmacy Records  lactase (LACTAID) 3000 UNITS tablet [id number] Yes Take 3,000 Units by mouth as needed (when eating foods containing dairy).  [provider]  Active Spouse/Significant Other, Pharmacy Records  metolazone  (ZAROXOLYN ) 2.5 MG tablet [id number] Yes Take 1 tablet (2.5 mg total) by mouth daily. Willette Adriana LABOR, MD  Active   metoprolol  succinate (TOPROL -XL) 100 MG 24 hr tablet [id number] Yes TAKE 1.5 (one and one half tablet) TABLET BY MOUTH 2 TIMES DAILY. TAKE WITH OR IMMEDIATELY FOLLOWING A MEAL  Patient taking differently: Take 100 mg by mouth in the morning and at bedtime. TAKE 1.5 (one and one half tablet) TABLET BY MOUTH 2 TIMES DAILY. TAKE WITH OR IMMEDIATELY FOLLOWING A MEAL   Cleaver, Josefa HERO, NP  Active Spouse/Significant Other, Pharmacy Records  mirabegron  ER (MYRBETRIQ ) 25 MG TB24 tablet [id number] Yes Take 1 tablet (25 mg total) by mouth daily. McGowen, Aleene DEL, MD  Active Spouse/Significant Other, Pharmacy Records  potassium chloride  SA (KLOR-CON  M) 20 MEQ tablet [id number] Yes Take 1 tablet (20 mEq total) by mouth daily. Willette Adriana LABOR, MD  Active   spironolactone  (ALDACTONE ) 25 MG tablet [id number] Yes Take 0.5 tablets (12.5 mg total) by mouth daily. Willette Adriana LABOR, MD  Active   torsemide  (DEMADEX ) 20 MG tablet [id number] Yes Take 2 tablets (40 mg total) by mouth daily. Willette Adriana LABOR, MD  Active             Goals      Patient Stated     Maintain current health      Patient Stated     None at this time      Patient Stated     Maintain current health     [facility] Transitions of Care [facility]) Care Plan     Problems:  Recent Hospitalization for treatment of admission for heart failure and respiratory distress  06/15/2024 Patient reports that she is breathing better. Reports no swelling today.  Reports that she is tired from having PT today.  Knowledge Deficit Related to management olf heart failure, Medication management barrier  missing medications , and No Hospital Follow Up Provider appointment no PCP hospital follow up  06/15/2024  Patient reports that she has her medications and is taking as prescribed, Reports that her Continous blood glucose monitor is working well. Reports that she did not go to PCP or CHF clinic appointment last week. No weights  06/15/2024 no weight today but reports yesterday weight of 198 pounds Continuous glucose monitoring not working.  06/15/2024  reports CBG working now and todays reading of  150.   Goal:  Over the next 30 days, the patient will not experience hospital readmission  Interventions:  Transitions of Care: Doctor Visits  - discussed the importance of doctor visits Confirmed active with PT  Encouraged patient and husband to meet with pharmacist to review medications.  Encouraged patient to attend her appointments ( PCP and CHF clinic). Reviewed appointment dates and time with patient.  Encouraged patient to do home exercises as per PT instructions.  Assessed pain and swelling   Heart Failure Interventions: Basic overview and discussion of pathophysiology of Heart Failure reviewed Provided education on low sodium diet Reviewed Heart Failure Action Plan in depth and provided verbal guidelines and referred patient and husband to reviewed AVS with written guidelines of when to call MD for weight gain.  Assessed need for readable accurate scales in home Provided education about placing scale on hard, flat  surface Advised patient to weigh each morning after emptying bladder Discussed importance of daily weight and advised patient to weigh and record daily Reviewed role of diuretics in prevention of fluid overload and management of heart failure; Discussed the importance of keeping all appointments with providers Long discussion with husband about heart failure management, DM management and medications.  Reviewed importance of daily CHF management to avoid readmissions.   Patient Self Care Activities: Weigh daily and record weights Monitor CBG 3 times a day Take medications as prescribed Call MD office for changes in condition Review discharge instructions again on your own. Use your inhaler as prescribed.  Take you insulin  Go to her heart failure appointment on 06/09/2024   Follow low salt diet. Do your home exercises when PT starts ( 06/10/2024)  Plan:  Telephone follow up appointment with care management team member scheduled for:  06/23/2024 at 1pm The patient has been provided with contact information for the care management team and has been advised to call with any health related questions or concerns.          Recommendation:   Continue Current Plan of Care  Follow Up Plan:   Telephone follow up appointment date/time:  06/23/2024   Alan Ee, RN, BSN, CEN Population Health- Transition of Care Team.  [facility] [phone]

## 2024-06-16 ENCOUNTER — Ambulatory Visit

## 2024-06-17 ENCOUNTER — Inpatient Hospital Stay: Admitting: Family Medicine

## 2024-06-17 ENCOUNTER — Ambulatory Visit: Admitting: Family Medicine

## 2024-06-17 NOTE — Telephone Encounter (Signed)
Lindsey has been notified.

## 2024-06-17 NOTE — Telephone Encounter (Signed)
 Copied from CRM #[id number]. Topic: Clinical - Home Health Verbal Orders >> Jun 17, 2024  4:07 PM Dedra NOVAK wrote: Caller/Agency: Morna from [facility] Callback Number: [phone] Service Requested: Home health aid Frequency: 2x/wk for 4 wks Any new concerns about the patient? Yes, she had a fall yesterday at home but no injuries   Okay for home health aid orders?

## 2024-06-18 NOTE — Telephone Encounter (Signed)
 Spoke to the patient today, I asked her why she had postponed her Tremfya  doses.  Patient endorsed that she was having some abdominal pain this week and did not feel like having the infusion, although she could not elaborate why she postpone it last week (it appears that her husband was sick recently). I advised her to proceed with her next infusion next week.  She reported she will do so.  Crystal, please count 4 weeks from her upcoming fusion. Thanks

## 2024-06-18 NOTE — Telephone Encounter (Signed)
 LVM for return call.

## 2024-06-18 NOTE — Telephone Encounter (Signed)
 Approved VO given to [place]

## 2024-06-18 NOTE — Telephone Encounter (Signed)
Yes, okay.

## 2024-06-18 NOTE — Telephone Encounter (Signed)
 Patient has cancelled the 06/16/2024 third and last infusion and has now rescheduled for 06/21/2024. For her third and final infusion.

## 2024-06-18 NOTE — Telephone Encounter (Signed)
 Dr. Eartha, This patient keeps cancelling her last IV infusion of Tremfya . She was originally scheduled for 06/09/2024. I spoke with Delon at the infusion clinic and she says the patient keeps calling them and pushing it out further. She was rescheduled from 06/09/2024, to 06/16/2024, then called them and rescheduled to 06/21/2024, which is this upcoming Monday. I noted to have the maintenance dose sent to the specialty pharmacy after the last induction dose, in order to have this sent to her before she was due for the maintenance dose at 12 weeks. The scheduling is now messed up. Do I count four weeks after the last loading dose for her maintenance to start?

## 2024-06-21 ENCOUNTER — Encounter: Attending: Gastroenterology | Admitting: Emergency Medicine

## 2024-06-21 VITALS — BP 135/44 | HR 58 | Temp 98.3°F | Resp 16

## 2024-06-21 DIAGNOSIS — K51011 Ulcerative (chronic) pancolitis with rectal bleeding: Secondary | ICD-10-CM | POA: Diagnosis not present

## 2024-06-21 MED ORDER — SODIUM CHLORIDE 0.9 % IV SOLN
200.0000 mg | Freq: Once | INTRAVENOUS | Status: AC
  Administered 2024-06-21: 200 mg via INTRAVENOUS
  Filled 2024-06-21: qty 20

## 2024-06-21 NOTE — Telephone Encounter (Signed)
 Noted thanks

## 2024-06-21 NOTE — Progress Notes (Signed)
 Diagnosis: Ulcerative Colitis  Provider:  Eartha Sieving MD  Procedure: IV Infusion  IV Type: Peripheral, IV Location: R Hand  Tremfya , Dose: 200 mg  Infusion Start Time: 1313  Infusion Stop Time: 1440  Post Infusion IV Care: Peripheral IV Discontinued  Discharge: Condition: Good, Destination: Home . AVS Provided  Performed by:  Delon ONEIDA Officer, RN

## 2024-06-22 ENCOUNTER — Other Ambulatory Visit (INDEPENDENT_AMBULATORY_CARE_PROVIDER_SITE_OTHER): Payer: Self-pay

## 2024-06-22 ENCOUNTER — Other Ambulatory Visit: Payer: Self-pay

## 2024-06-22 ENCOUNTER — Ambulatory Visit (INDEPENDENT_AMBULATORY_CARE_PROVIDER_SITE_OTHER): Admitting: Family Medicine

## 2024-06-22 ENCOUNTER — Other Ambulatory Visit (HOSPITAL_COMMUNITY): Payer: Self-pay

## 2024-06-22 ENCOUNTER — Encounter: Payer: Self-pay | Admitting: Family Medicine

## 2024-06-22 VITALS — BP 124/77 | HR 118 | Temp 98.1°F | Ht 62.0 in | Wt 205.4 lb

## 2024-06-22 DIAGNOSIS — E1140 Type 2 diabetes mellitus with diabetic neuropathy, unspecified: Secondary | ICD-10-CM | POA: Diagnosis not present

## 2024-06-22 DIAGNOSIS — D508 Other iron deficiency anemias: Secondary | ICD-10-CM | POA: Diagnosis not present

## 2024-06-22 DIAGNOSIS — E1122 Type 2 diabetes mellitus with diabetic chronic kidney disease: Secondary | ICD-10-CM

## 2024-06-22 DIAGNOSIS — E119 Type 2 diabetes mellitus without complications: Secondary | ICD-10-CM

## 2024-06-22 DIAGNOSIS — I5032 Chronic diastolic (congestive) heart failure: Secondary | ICD-10-CM

## 2024-06-22 DIAGNOSIS — Z794 Long term (current) use of insulin: Secondary | ICD-10-CM

## 2024-06-22 DIAGNOSIS — J9601 Acute respiratory failure with hypoxia: Secondary | ICD-10-CM | POA: Diagnosis not present

## 2024-06-22 DIAGNOSIS — N1831 Chronic kidney disease, stage 3a: Secondary | ICD-10-CM | POA: Diagnosis not present

## 2024-06-22 DIAGNOSIS — E1142 Type 2 diabetes mellitus with diabetic polyneuropathy: Secondary | ICD-10-CM | POA: Diagnosis not present

## 2024-06-22 DIAGNOSIS — I110 Hypertensive heart disease with heart failure: Secondary | ICD-10-CM | POA: Diagnosis not present

## 2024-06-22 DIAGNOSIS — M160 Bilateral primary osteoarthritis of hip: Secondary | ICD-10-CM | POA: Diagnosis not present

## 2024-06-22 DIAGNOSIS — I480 Paroxysmal atrial fibrillation: Secondary | ICD-10-CM | POA: Diagnosis not present

## 2024-06-22 DIAGNOSIS — J9811 Atelectasis: Secondary | ICD-10-CM | POA: Diagnosis not present

## 2024-06-22 DIAGNOSIS — I700 Atherosclerosis of aorta: Secondary | ICD-10-CM | POA: Diagnosis not present

## 2024-06-22 DIAGNOSIS — M47814 Spondylosis without myelopathy or radiculopathy, thoracic region: Secondary | ICD-10-CM | POA: Diagnosis not present

## 2024-06-22 DIAGNOSIS — I5033 Acute on chronic diastolic (congestive) heart failure: Secondary | ICD-10-CM | POA: Diagnosis not present

## 2024-06-22 LAB — CBC
HCT: 40 % (ref 36.0–46.0)
Hemoglobin: 13.5 g/dL (ref 12.0–15.0)
MCHC: 33.7 g/dL (ref 30.0–36.0)
MCV: 92.6 fl (ref 78.0–100.0)
Platelets: 128 K/uL — ABNORMAL LOW (ref 150.0–400.0)
RBC: 4.32 Mil/uL (ref 3.87–5.11)
RDW: 15.9 % — ABNORMAL HIGH (ref 11.5–15.5)
WBC: 7.5 K/uL (ref 4.0–10.5)

## 2024-06-22 LAB — BASIC METABOLIC PANEL WITH GFR
BUN: 40 mg/dL — ABNORMAL HIGH (ref 6–23)
CO2: 39 meq/L — ABNORMAL HIGH (ref 19–32)
Calcium: 9.5 mg/dL (ref 8.4–10.5)
Chloride: 83 meq/L — ABNORMAL LOW (ref 96–112)
Creatinine, Ser: 1.57 mg/dL — ABNORMAL HIGH (ref 0.40–1.20)
GFR: 31.78 mL/min — ABNORMAL LOW (ref >60.00)
Glucose, Bld: 410 mg/dL — ABNORMAL HIGH (ref 70–99)
Potassium: 3 meq/L — ABNORMAL LOW (ref 3.5–5.1)
Sodium: 135 meq/L (ref 135–145)

## 2024-06-22 LAB — MAGNESIUM: Magnesium: 1.8 mg/dL (ref 1.5–2.5)

## 2024-06-22 LAB — MICROALBUMIN / CREATININE URINE RATIO
Creatinine,U: 54.3 mg/dL
Microalb Creat Ratio: 134.5 mg/g — ABNORMAL HIGH (ref 0.0–30.0)
Microalb, Ur: 7.3 mg/dL — ABNORMAL HIGH (ref 0.0–1.9)

## 2024-06-22 MED ORDER — INSULIN LISPRO (1 UNIT DIAL) 100 UNIT/ML (KWIKPEN): PEN_INJECTOR | SUBCUTANEOUS | Status: DC

## 2024-06-22 NOTE — Telephone Encounter (Signed)
 Maintenance doses sent to [name]:    ndications of Use: Ulcerative Colitis       Sig: Inject 200 mg into the skin every 28 (twenty-eight) days. Starting 4 weeks after the last Tremfya  infusion which will be around June 10, 2024       Start Date: 07/08/24 End Date: --  Written Date: 04/21/24 Expiration Date: 04/21/25  Providers  Ordering and Authorizing Provider: Eartha Flavors, Toribio, MD [phone] [address]. [address], [place] [address] Phone: [phone]   Fax: [phone] DEA #: [id number]   NPI: [id number] --      Ordering User: James, Teldrin D, [facility]      Pharmacy  [facility] This order has not been released to its destination. [address], [place] [address] Phone: [phone]  Fax: [phone] DEA #: [id number]       Patient had her last loading dose of Tremfya  on 06/21/2024 at [facility]. Four weeks after this would be around 07/19/2024 is when she is due for her first maintenance dose SQ of Tremfya  200 mg SQ Q four weeks starting at week 12.

## 2024-06-22 NOTE — Telephone Encounter (Signed)
 I need to call [name] and let them know the due date has changed to 07/19/2024 due to the patient cancelling her last few infusions.

## 2024-06-22 NOTE — Telephone Encounter (Signed)
 I called [name] [phone] and spoke with [facility] whom will check on the status of this order and she will call our office back. She is aware the maintenance dose is now due 07/19/2024.

## 2024-06-22 NOTE — Progress Notes (Signed)
 OFFICE VISIT  07/14/2024  CC:  Chief Complaint  Patient presents with   Hospitalization Follow-up    Patient is a 78 y.o. female who presents for follow-up chronic illnesses.  INTERIM HX: She was admitted to the hospital September 2 to June 03, 2024 for acute on chronic diastolic congestive heart failure.  She required BiPAP and IV Lasix  and metolazone . Instructions for Humalog  upon discharge--> 115 q. breakfast, 108 q. lunch, and 65 q. Supper. Torsemide  20 mg tabs, 2 tabs daily.  Glucoses fluctuating significantly, as per her usual.  She is having hypoglycemia pretty regularly in the middle of the night.  Still having memory problems.  Her cardiologist recently increased her Toprol  to 150 mg twice a day. She was in A-fib at that time and her weight at that time was 220 pounds.  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 ? to get liver bx if she gets bariatric surgery? Mild portal hypertensive gastropathy on EGD 08/2019.   Fibromyalgia    GAD (generalized anxiety disorder)    GERD    History of hiatal hernia    History of iron  deficiency anemia 12/2018   Inadequate absorption secondary to chronic/long term PPI therapy + portal hypertensive gastroduodenopathy. No GIB found on EGD, colonosc, and givens. Iron  infusions X multiple.   History of thrombocytopenia 12/2011   Hyperlipidemia    Intolerant of statins   HYPERTENSION    IBS (irritable bowel syndrome)    -D.  Good response to bentyl  and imodium  as of 06/2018 GI f/u.   IDDM (insulin  dependent diabetes mellitus)    with DPN (managed by Dr. Trixie but then in 2018 pt preferred to have me manage for her convenience)   Limited mobility    Requires a walker for arthritic pain, widespread musculoskeletal pain, and neuropathic pain.    Morbid obesity (HCC)    As of 11/2018, pt considering sleeve gastectomy vs bipass as of eval by Dr. Gael considering as of 12/2019.   Nonalcoholic steatohepatitis    Viral Hep screens NEG.  CT 2015.  Transaminasemia.  U/S 07/2018 showed early changes of cirrhosis.   OSA (obstructive sleep apnea) 09/14/2015   sleep study 09/07/15: severe obstructive sleep apnea with an AHI of 72 and SaO2 low of 75%.>referred to sleep MD   Osteoarthritis    hips, shoulders, knees   PAF (paroxysmal atrial fibrillation) (HCC)    One documented episode (after getting EGD 2016).  Was on amiodarone  x 3 mo.  Rate control with metoprolol  + anticoag with xarelto . Watchman 12/2022, off anticoag   Portal hypertensive gastropathy (HCC)    hemorrhagic gastropathy + non-bleeding gastric ulcers on EGD 08/2023   Recurrent epistaxis    Granuloma in L nare cauterized by ENT 04/2020. Another cautery 06/2020   Small fiber neuropathy    Due to DM.  Symmetric hands and feet tingling/numbness.   Ulcerative colitis (HCC)    Remicade  infusion Q 8 weeks: in clinical and endoscopic remission as of 12/2018 GI f/u.  06/11/19 rpt colonoscopy->cecal and ascending colon colitis.    Past Surgical History:  Procedure Laterality Date   ABDOMINAL HYSTERECTOMY  1980   Paps no longer indicated.   BACK SURGERY     BACTERIAL OVERGROWTH TEST N/A 07/13/2015   Procedure: BACTERIAL OVERGROWTH TEST;  Surgeon: Claudis  RAYMOND Rivet, MD;  [facility]: [facility];  Service: Endoscopy;  Laterality: N/A;  730     BILATERAL SALPINGOOPHORECTOMY  02/10/2001   BIOPSY  06/11/2019   Procedure: BIOPSY;  Surgeon: Rivet Claudis RAYMOND, MD;  Location: [facility];  Service: Endoscopy;;  colon   BIOPSY  09/27/2019   Procedure: BIOPSY;  Surgeon: Rivet Claudis RAYMOND, MD;  [facility]: [facility];  Service: Endoscopy;;  gastric duodenal   BIOPSY  09/15/2020   Procedure: BIOPSY;  Surgeon: Eartha Angelia Sieving, MD;  [facility]: [facility];  Service:  Gastroenterology;;   BIOPSY  09/25/2023   Procedure: BIOPSY;  Surgeon: Eartha Angelia Sieving, MD;  [facility]: [facility];  Service: Gastroenterology;;   BREAST REDUCTION SURGERY  1994   bilat   BREAST SURGERY     CARDIOVASCULAR STRESS TEST  07/2010   Lexiscan myoview: normal   CARPAL TUNNEL RELEASE Right 1996   CARPAL TUNNEL RELEASE Left 03/21/2003   CARPAL TUNNEL RELEASE Right 05/04/2013   Procedure: CARPAL TUNNEL RELEASE;  Surgeon: Lamar LULLA Leonor Mickey., MD;  Location: [facility];  Service: Orthopedics;  Laterality: Right;   CARPAL TUNNEL RELEASE Left 09/21/2013   Procedure: LEFT CARPAL TUNNEL RELEASE;  Surgeon: Lamar LULLA Leonor Mickey., MD;  Location: [facility];  Service: Orthopedics;  Laterality: Left;   CHOLECYSTECTOMY     COLONOSCOPY WITH PROPOFOL  N/A 08/04/2015   Colitis in remission.  No polyps.  Procedure: COLONOSCOPY WITH PROPOFOL ;  Surgeon: Claudis RAYMOND Rivet, MD;  Location: [facility];  Service: Endoscopy;  Laterality: N/A;  cecum time in  0820   time out  0827    total time 7 minutes   COLONOSCOPY WITH PROPOFOL  N/A 06/11/2019   cecal and ascending colon colitis.  Procedure: COLONOSCOPY WITH PROPOFOL ;  Surgeon: Rivet Claudis RAYMOND, MD;  [facility]: [facility];  Service: Endoscopy;  Laterality: N/A;  730a   COLONOSCOPY WITH PROPOFOL  N/A 09/15/2020   Procedure: COLONOSCOPY WITH PROPOFOL ;  Surgeon: Eartha Angelia Sieving, MD;  [facility]: [facility];  Service: Gastroenterology;  Laterality: N/A;  1030   ESOPHAGEAL DILATION N/A 08/04/2015   Procedure: ESOPHAGEAL DILATION;  Surgeon: Claudis RAYMOND Rivet, MD;  Location: [facility];  Service: Endoscopy;  Laterality: N/AMERL Agapito MOH, no mucousal disruption   ESOPHAGOGASTRODUODENOSCOPY  09/27/2019   Performed for IDA.  Esoph dilation was done but no stricture present.  Mild portal hypertensive gastropathy, o/w normal.  Duodenal bx NEG.  h pylori neg.   ESOPHAGOGASTRODUODENOSCOPY     09/25/2023 inactive chronic gastritis,  h pylor neg. benign   ESOPHAGOGASTRODUODENOSCOPY     09/25/23 hemorrhagic portal gastropathy + nonbleeding gastric ulcers (H pylori neg)   ESOPHAGOGASTRODUODENOSCOPY (EGD) WITH ESOPHAGEAL DILATION  12/02/2005   ESOPHAGOGASTRODUODENOSCOPY (EGD) WITH PROPOFOL  N/A 08/04/2015   Procedure: ESOPHAGOGASTRODUODENOSCOPY (EGD) WITH PROPOFOL ;  Surgeon: Claudis RAYMOND Rivet, MD;  Location: [facility];  Service: Endoscopy;  Laterality: N/A;  procedure 1   ESOPHAGOGASTRODUODENOSCOPY (EGD) WITH PROPOFOL  N/A 09/27/2019   Procedure: ESOPHAGOGASTRODUODENOSCOPY (EGD) WITH PROPOFOL ;  Surgeon: Rivet Claudis RAYMOND, MD;  [facility];  Service: Endoscopy;  Laterality: N/A;  12:10   ESOPHAGOGASTRODUODENOSCOPY (EGD) WITH PROPOFOL  N/A 10/31/2021   Procedure: ESOPHAGOGASTRODUODENOSCOPY (EGD) WITH PROPOFOL ;  Surgeon: Rivet Claudis RAYMOND, MD;  [facility];  Service: Endoscopy;  Laterality: N/A;  930   ESOPHAGOGASTRODUODENOSCOPY (EGD) WITH PROPOFOL  N/A 09/25/2023   Procedure: ESOPHAGOGASTRODUODENOSCOPY (EGD) WITH PROPOFOL ;  Surgeon: Eartha Angelia Sieving, MD;  [facility];  Service: Gastroenterology;  Laterality: N/A;  10:30AM;ASA 3   FLEXIBLE SIGMOIDOSCOPY  01/17/2012   Procedure: FLEXIBLE SIGMOIDOSCOPY;  Surgeon: Claudis RAYMOND Rivet, MD;  [facility];  Service: Endoscopy;  Laterality: N/A;   GIVENS CAPSULE STUDY N/A 08/03/2019   Procedure: GIVENS CAPSULE STUDY (performed for IDA)->some food debris in stomach and small amount of blood.  Surgeon: Rivet Claudis RAYMOND, MD;  [facility]: [facility];  Service: Endoscopy;  Laterality: N/A;  730AM   HEMILAMINOTOMY LUMBAR SPINE Bilateral 09/07/1999   L4-5   HOT HEMOSTASIS  09/25/2023   Procedure: HOT HEMOSTASIS (ARGON PLASMA COAGULATION/BICAP);  Surgeon: Eartha Flavors, Toribio, MD;  [facility];  Service: Gastroenterology;;   KNEE ARTHROSCOPY Right 01/1999; 10/2000   LEFT ATRIAL APPENDAGE OCCLUSION N/A 01/23/2023   Procedure: LEFT ATRIAL  APPENDAGE OCCLUSION;  Surgeon: Wonda Sharper, MD;  Location: [facility];  Service: Cardiovascular;  Laterality: N/A;   LEFT ATRIAL APPENDAGE OCCLUSION     LYSIS OF ADHESION  02/10/2001   MALONEY DILATION  09/27/2019   Procedure: MALONEY DILATION;  Surgeon: Rivet Claudis RAYMOND, MD;  [facility];  Service: Endoscopy;;   NASAL ENDOSCOPY WITH EPISTAXIS CONTROL Bilateral 01/25/2022   Procedure: NASAL ENDOSCOPY WITH EPISTAXIS CONTROL;  Surgeon: Mable Lenis, MD;  Location: [facility];  Service: ENT;  Laterality: Bilateral;   NASAL SEPTOPLASTY W/ TURBINOPLASTY Bilateral 01/25/2022   Procedure: NASAL SEPTOPLASTY WITH TURBINATE REDUCTION;  Surgeon: Mable Lenis, MD;  Location: [facility];  Service: ENT;  Laterality: Bilateral;   RECTOCELE REPAIR  1990; 09/12/2006   TARSAL TUNNEL RELEASE  2002   TEE WITHOUT CARDIOVERSION N/A 01/23/2023   Procedure: TRANSESOPHAGEAL ECHOCARDIOGRAM;  Surgeon: Wonda Sharper, MD;  [facility]: [facility] CV [facility];  Service: Cardiovascular;  Laterality: N/A;   TRANSESOPHAGEAL ECHOCARDIOGRAM (CATH LAB) N/A 08/26/2023   4mm leak around watchman device. Procedure: TRANSESOPHAGEAL ECHOCARDIOGRAM;  Surgeon: Loni Soyla LABOR, MD;  Location: [facility] INVASIVE CV LAB;  Service: Cardiovascular;  Laterality: N/A;   TRANSTHORACIC ECHOCARDIOGRAM  08/04/2015   EF 60-65%, normal wall motion, mild LVH, mild LA dilation, grd I DD.  11/2023 overall poor quality, EF 55-60%   TUMOR EXCISION Left 03/21/2003   dorsal 1st web space (hand)   URETEROLYSIS Right 02/10/2001    Outpatient Medications Prior to Visit  Medication Sig Dispense Refill   Accu-Chek Softclix Lancets lancets USE TO CHECK BLOOD GLUCOSE UP TO 6 TIMES DAILY AS DIRECTED 200 each 5   albuterol  (VENTOLIN  HFA) 108 (90 Base) MCG/ACT inhaler Inhale 2 puffs into the lungs every 6 (six) hours as needed for wheezing or shortness of breath. 8 g 0   aspirin  EC 81 MG tablet Take 1 tablet (81 mg total) by mouth daily. Swallow  whole.     blood glucose meter kit and supplies KIT Use up to six times daily as directed. DX. E11.9 1 each 0   citalopram  (CELEXA ) 20 MG tablet Take 1 tablet (20 mg total) by mouth daily. 90 tablet 1   Continuous Glucose Receiver (FREESTYLE LIBRE 3 READER) DEVI Use as directed to check blood sugars 1 each 11   Continuous Glucose Sensor (FREESTYLE LIBRE 3 PLUS SENSOR) MISC Change sensor every 15 days. 2 each 3   dapagliflozin  propanediol (FARXIGA ) 5 MG TABS tablet Take 1 tablet (5 mg total) by mouth daily. 30 tablet 1   Dulaglutide  (TRULICITY ) 1.5 MG/0.5ML SOAJ INJECT 1.5 MG (0.5ML) UNDER THE SKIN ONCE A WEEK 18 mL 3   esomeprazole  (NEXIUM ) 40 MG capsule Take 1 capsule (  40 mg total) by mouth daily before breakfast. 90 capsule 3   gabapentin  (NEURONTIN ) 100 MG capsule Take 5 capsules (500 mg total) by mouth daily at 6 (six) AM. 150 capsule 0   glucose blood (ACCU-CHEK GUIDE TEST) test strip USE TO CHECK BLOOD GLUCOSE UP TO 6 TIMES DAILY. 500 strip 1   Insulin  Pen Needle (B-D UF III MINI PEN NEEDLES) 31G X 5 MM MISC USE TO INJECT INSULINS EQUAL TO 6 TIMES DAILY. 500 each 6   lactase (LACTAID) 3000 UNITS tablet Take 3,000 Units by mouth as needed (when eating foods containing dairy).      metolazone  (ZAROXOLYN ) 2.5 MG tablet Take 1 tablet (2.5 mg total) by mouth daily. 30 tablet 1   metoprolol  succinate (TOPROL -XL) 100 MG 24 hr tablet TAKE 1.5 (one and one half tablet) TABLET BY MOUTH 2 TIMES DAILY. TAKE WITH OR IMMEDIATELY FOLLOWING A MEAL (Patient taking differently: Take 100 mg by mouth in the morning and at bedtime. TAKE 1.5 (one and one half tablet) TABLET BY MOUTH 2 TIMES DAILY. TAKE WITH OR IMMEDIATELY FOLLOWING A MEAL) 180 tablet 1   mirabegron  ER (MYRBETRIQ ) 25 MG TB24 tablet Take 1 tablet (25 mg total) by mouth daily. 30 tablet 3   potassium chloride  SA (KLOR-CON  M) 20 MEQ tablet Take 1 tablet (20 mEq total) by mouth daily. 30 tablet 1   spironolactone  (ALDACTONE ) 25 MG tablet Take 0.5 tablets  (12.5 mg total) by mouth daily. 30 tablet 1   torsemide  (DEMADEX ) 20 MG tablet Take 2 tablets (40 mg total) by mouth daily. 90 tablet 0   insulin  lispro (HUMALOG  KWIKPEN) 100 UNIT/ML KwikPen 115 units with breakfast, 108 units with lunch, and 65 U at supper     [START ON 07/08/2024] Guselkumab  (TREMFYA  PEN) 200 MG/2ML SOAJ Inject 200 mg into the skin every 28 (twenty-eight) days. Starting 4 weeks after the last Tremfya  infusion which will be around June 10, 2024 2 mL 12   Facility-Administered Medications Prior to Visit  Medication Dose Route Frequency Provider Last Rate Last Admin   acetaminophen  (TYLENOL ) tablet 650 mg  650 mg Oral Once Eartha Flavors, Toribio, MD       diphenhydrAMINE  (BENADRYL ) capsule 25 mg  25 mg Oral Once Eartha Flavors Toribio, MD        Allergies  Allergen Reactions   Flagyl  [Metronidazole  Hcl] Other (See Comments)    Diaphoresis    Omeprazole  Anaphylaxis and Swelling    Tongue and throat swelling   Pioglitazone Other (See Comments) and Swelling    Weight gain, tongue swelling   Allevyn Adhesive [Wound Dressings] Other (See Comments)    Unknown   Benzocaine-Menthol Swelling    Mouth swelling   Lactose Diarrhea and Other (See Comments)    Gas, bloating   Lactose Intolerance (Gi) Diarrhea    Gas, bloating   Metformin  And Related Diarrhea   Shrimp [Shellfish Allergy] Itching    Throat and ear itching, if consumed raw   Statins Palpitations   Welchol [Colesevelam] Other (See Comments)    GI upset   Adhesive [Tape] Other (See Comments)    Skin irritation and bruising    Desipramine  Hcl Itching, Nausea Only and Other (See Comments)    Swimmy headed   Hydromorphone  Itching   Jardiance  Burnett.Bury ] Other (See Comments)    Weakness    Nisoldipine Itching   Percocet [Oxycodone -Acetaminophen ] Itching    Review of Systems As per HPI  PE:    06/23/2024  1:14 PM 06/22/2024   10:24 AM 06/21/2024    2:40 PM  Vitals with BMI  Height  5' 2    Weight 202 lbs 205 lbs 6 oz   BMI 36.94 37.56   Systolic  124 135  Diastolic  77 44  Pulse  118 58     Physical Exam  General: Alert and well-appearing. Thought and speech are lucid today. 1+ pitting edema bilaterally. No further exam today.  LABS:  Last CBC Lab Results  Component Value Date   WBC 7.5 06/22/2024   HGB 13.5 06/22/2024   HCT 40.0 06/22/2024   MCV 92.6 06/22/2024   MCH 31.1 06/02/2024   RDW 15.9 (H) 06/22/2024   PLT 128.0 (L) 06/22/2024   Lab Results  Component Value Date   IRON  166 (H) 06/22/2024   TIBC 379 06/22/2024   FERRITIN 44 06/22/2024   Last metabolic panel Lab Results  Component Value Date   GLUCOSE 410 (H) 06/22/2024   NA 135 06/22/2024   K 3.0 (L) 06/22/2024   CL 83 (L) 06/22/2024   CO2 39 (H) 06/22/2024   BUN 40 (H) 06/22/2024   CREATININE 1.57 (H) 06/22/2024   GFRNONAA 37 (L) 06/03/2024   CALCIUM  9.5 06/22/2024   PHOS 4.0 06/01/2024   PROT 7.8 06/01/2024   ALBUMIN 3.0 (L) 06/01/2024   LABGLOB 3.4 08/17/2015   AGRATIO 1.0 08/17/2015   BILITOT 1.5 (H) 06/01/2024   ALKPHOS 74 06/01/2024   AST 33 06/01/2024   ALT 17 06/01/2024   ANIONGAP 12 06/03/2024   Last hemoglobin A1c Lab Results  Component Value Date   HGBA1C 6.8 (A) 05/03/2024   HGBA1C 6.8 05/03/2024   HGBA1C 6.8 (A) 05/03/2024   HGBA1C 6.8 05/03/2024   Last thyroid  functions Lab Results  Component Value Date   TSH 3.323 06/01/2024   IMPRESSION AND PLAN:  #1 diabetes with complications, erratic control. Frequent nocturnal hypoglycemia.  Will decrease her supper Humalog  to 60 units. Increase breakfast Humalog  to 130, increase midday Humalog  to 115. Her last hemoglobin A1c was about 6 weeks ago and was 6.8%.  However, I do not think her A1c is accurate due to her chronic iron  deficiency anemia.  Check fructosamine level today. Refer to endocrinology for expert management. Urine microalbumin/creatinine today.  #2 chronic diastolic heart failure. Euvolemic.   Continue torsemide  40 mg a day, spironolactone  12.5 mg a day, Toprol -XL 150 mg twice a day. Monitor electrolytes and creatinine today. Serum creatinine upon admission on 9/2 was 1.28.  Upon d/c on 9/4 it was 1.45.  #3 iron  deficiency anemia. Monitor CBC and iron  today.  An After Visit Summary was printed and given to the patient.  FOLLOW UP: Return in about 4 weeks (around 07/20/2024) for routine chronic illness f/u.  Signed:  Gerlene Hockey, MD           07/11/2024

## 2024-06-22 NOTE — Patient Instructions (Addendum)
 Increase your morning insulin  dose to 130 units. Increase your lunchtime (middle of the day) insulin  dose to 115 units. Decrease your suppertime insulin  dose to 60 units.

## 2024-06-23 ENCOUNTER — Other Ambulatory Visit: Payer: Self-pay

## 2024-06-23 NOTE — Transitions of Care (Post Inpatient/ED Visit) (Signed)
 Transition of Care week 3  Visit Note  06/23/2024  Name: [name] MRN: [id number]          DOB: Feb 24, 1946  Situation: Patient enrolled in [facility] 30-day program. Visit completed with patient and husband by telephone.   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 ? to get liver bx if she gets bariatric surgery? Mild portal hypertensive gastropathy on EGD 08/2019.   Fibromyalgia    GAD (generalized anxiety disorder)    GERD    History of hiatal hernia    History of iron  deficiency anemia 12/2018   Inadequate absorption secondary to chronic/long term PPI therapy + portal hypertensive gastroduodenopathy. No GIB found on EGD, colonosc, and givens. Iron  infusions X multiple.   History of thrombocytopenia 12/2011   Hyperlipidemia    Intolerant of statins   HYPERTENSION    IBS (irritable bowel syndrome)    -D.  Good response to bentyl  and imodium  as of 06/2018 GI f/u.   IDDM (insulin  dependent diabetes mellitus)    with DPN (managed by Dr. Trixie but then in 2018 pt preferred to have me manage for her convenience)   Limited mobility    Requires a walker for arthritic pain, widespread musculoskeletal pain, and neuropathic pain.   Morbid obesity (HCC)    As of 11/2018, pt considering sleeve gastectomy vs bipass as of eval by Dr. Gael considering as of 12/2019.   Nonalcoholic steatohepatitis    Viral Hep screens NEG.  CT 2015.  Transaminasemia.  U/S 07/2018 showed early changes of cirrhosis.   OSA (obstructive sleep apnea) 09/14/2015   sleep study 09/07/15: severe obstructive sleep apnea with an AHI of 72 and SaO2 low of 75%.>referred to sleep MD   Osteoarthritis    hips, shoulders, knees   PAF (paroxysmal  atrial fibrillation) (HCC)    One documented episode (after getting EGD 2016).  Was on amiodarone  x 3 mo.  Rate control with metoprolol  + anticoag with xarelto . Watchman 12/2022, off anticoag   Portal hypertensive gastropathy (HCC)    hemorrhagic gastropathy + non-bleeding gastric ulcers on EGD 08/2023   Recurrent epistaxis    Granuloma in L nare cauterized by ENT 04/2020. Another cautery 06/2020   Small fiber neuropathy    Due to DM.  Symmetric hands and feet tingling/numbness.   Ulcerative colitis (HCC)    Remicade  infusion Q 8 weeks: in clinical and endoscopic remission as of 12/2018 GI f/u.  06/11/19 rpt colonoscopy->cecal and ascending colon colitis.    Assessment: Patient continues to be confused on my calls. When I inquired with husband he states that patient is awake but stubborn and does not want to answer.  Husband reports that patient has been taking her oral medications as prescribed.  Husband denies observing patient taking her insulin  today. PCP follow up yesterday.  Denies any new problems or concerns today.  Patient Reported Symptoms: Cognitive Cognitive Status: Able to follow simple commands, Confused or disoriented, Struggling with memory recall (unable to verbalize date or year.)      Neurological Neurological Review of Symptoms: No symptoms reported    HEENT HEENT Symptoms Reported: No symptoms reported      Cardiovascular Cardiovascular Symptoms Reported: Fatigue Cardiovascular Management Strategies: Medication  therapy Weight: 202 lb (91.6 kg) Cardiovascular Comment: husband reports ankles and feet are swollen  Respiratory Respiratory Symptoms Reported: No symptoms reported    Endocrine Endocrine Symptoms Reported: Not assessed Is patient diabetic?: Yes Is patient checking blood sugars at home?: Yes List most recent blood sugar readings, include date and time of day: fasting 284,  during call 311.    Gastrointestinal Gastrointestinal Symptoms Reported: No symptoms  reported Additional Gastrointestinal Details: LBM  today.      Genitourinary Genitourinary Symptoms Reported: Incontinence    Integumentary Integumentary Symptoms Reported: No symptoms reported    Musculoskeletal Musculoskelatal Symptoms Reviewed: Weakness, Difficulty walking Additional Musculoskeletal Details: PT yesterday.  using walker today        Psychosocial Psychosocial Symptoms Reported: Not assessed         Today's Vitals   06/23/24 1314 06/23/24 1315  Weight: 202 lb (91.6 kg)   PainSc:  0-No pain     Medications Reviewed Today     Reviewed by Jennifer Alan PENNER, RN (Registered Nurse) on 06/23/24 at 1305  Med List Status: <None>   Medication Order Taking? Sig Documenting Provider Last Dose Status Informant  Accu-Chek Softclix Lancets lancets [id number] Yes USE TO CHECK BLOOD GLUCOSE UP TO 6 TIMES DAILY AS DIRECTED [name], Jennifer DEL, MD  Active Spouse/Significant Other, Pharmacy Records  acetaminophen  (TYLENOL ) tablet 650 mg [id number]   Castaneda [name], Daniel, MD  Active   albuterol  (VENTOLIN  HFA) [phone] Base) MCG/ACT inhaler [id number] Yes Inhale 2 puffs into the lungs every 6 (six) hours as needed for wheezing or shortness of breath. [name], Jennifer H, MD  Active Spouse/Significant Other, Pharmacy Records  aspirin  EC 81 MG tablet [id number] Yes Take 1 tablet (81 mg total) by mouth daily. Swallow whole. Jennifer Kate BIRCH, NP  Active Spouse/Significant Other, Pharmacy Records  blood glucose meter kit and supplies KIT [id number] Yes Use up to six times daily as directed. DX. E11.9 [name], Jennifer DEL, MD  Active Spouse/Significant Other, Pharmacy Records    Discontinued 12/05/11 1452 (Completed Course)   citalopram  (CELEXA ) 20 MG tablet [id number] Yes Take 1 tablet (20 mg total) by mouth daily. [name], Jennifer DEL, MD  Active Spouse/Significant Other, Pharmacy Records  Continuous Glucose Receiver (FREESTYLE LIBRE 3 READER) DEVI [id number] Yes Use as directed to check blood sugars  Lowella, Benton CROME, PA  Active Spouse/Significant Other, Pharmacy Records  Continuous Glucose Sensor (FREESTYLE LIBRE 3 PLUS SENSOR) MISC [id number] Yes Change sensor every 15 days. [name], Jennifer H, MD  Active Spouse/Significant Other, Pharmacy Records  dapagliflozin  propanediol (FARXIGA ) 5 MG TABS tablet [id number] Yes Take 1 tablet (5 mg total) by mouth daily. Crain, Whitney L, PA  Active Spouse/Significant Other, Pharmacy Records  diphenhydrAMINE  (BENADRYL ) capsule 25 mg [id number]   Castaneda [name], Toribio, MD  Active   Dulaglutide  (TRULICITY ) 1.5 MG/0.5ML EMMANUEL [id number] Yes INJECT 1.5 MG (0.5ML) UNDER THE SKIN ONCE A WEEK [name], Jennifer DEL, MD  Active Spouse/Significant Other, Pharmacy Records  esomeprazole  (NEXIUM ) 40 MG capsule [id number] Yes Take 1 capsule (40 mg total) by mouth daily before breakfast. Castaneda [name], Toribio, MD  Active Spouse/Significant Other, Pharmacy Records  gabapentin  (NEURONTIN ) 100 MG capsule [id number] Yes Take 5 capsules (500 mg total) by mouth daily at 6 (six) AM. Shahmehdi, Seyed A, MD  Active   glucose blood (ACCU-CHEK GUIDE TEST) test strip [id number] Yes USE TO CHECK BLOOD GLUCOSE UP TO 6 TIMES DAILY. [name], Jennifer DEL, MD  Active Spouse/Significant Other, Pharmacy Records  Guselkumab  (TREMFYA  PEN) 200 MG/2ML  SOAJ [id number] Yes Inject 200 mg into the skin every 28 (twenty-eight) days. Starting 4 weeks after the last Tremfya  infusion which will be around June 10, 2024 Castaneda [name], Toribio, MD  Active Spouse/Significant Other, Pharmacy Records  insulin  lispro (HUMALOG  [facility]) 100 UNIT/ML KwikPen [id number] Yes 130 units with breakfast, 115 units with lunch, and 60 U at supper [name], Jennifer DEL, MD  Active   Insulin  Pen Needle (B-D UF III MINI PEN NEEDLES) 31G X 5 MM MISC [id number] Yes USE TO INJECT INSULINS EQUAL TO 6 TIMES DAILY. [name], Jennifer DEL, MD  Active Spouse/Significant Other, Pharmacy Records  lactase (LACTAID) 3000 UNITS tablet [id number] Yes Take  3,000 Units by mouth as needed (when eating foods containing dairy).  [provider]  Active Spouse/Significant Other, Pharmacy Records  metolazone  (ZAROXOLYN ) 2.5 MG tablet [id number] Yes Take 1 tablet (2.5 mg total) by mouth daily. Willette Adriana LABOR, MD  Active   metoprolol  succinate (TOPROL -XL) 100 MG 24 hr tablet [id number] Yes TAKE 1.5 (one and one half tablet) TABLET BY MOUTH 2 TIMES DAILY. TAKE WITH OR IMMEDIATELY FOLLOWING A MEAL  Patient taking differently: Take 100 mg by mouth in the morning and at bedtime. TAKE 1.5 (one and one half tablet) TABLET BY MOUTH 2 TIMES DAILY. TAKE WITH OR IMMEDIATELY FOLLOWING A MEAL   Cleaver, Josefa HERO, NP  Active Spouse/Significant Other, Pharmacy Records  mirabegron  ER (MYRBETRIQ ) 25 MG TB24 tablet [id number] Yes Take 1 tablet (25 mg total) by mouth daily. [name], Jennifer DEL, MD  Active Spouse/Significant Other, Pharmacy Records  potassium chloride  SA (KLOR-CON  M) 20 MEQ tablet [id number] Yes Take 1 tablet (20 mEq total) by mouth daily. Willette Adriana LABOR, MD  Active   spironolactone  (ALDACTONE ) 25 MG tablet [id number] Yes Take 0.5 tablets (12.5 mg total) by mouth daily. Willette Adriana LABOR, MD  Active   torsemide  (DEMADEX ) 20 MG tablet [id number] Yes Take 2 tablets (40 mg total) by mouth daily. Willette Adriana LABOR, MD  Active             Goals Addressed             This Visit's Progress    [facility]I Transitions of Care (TOC) Care Plan       Problems:  Recent Hospitalization for treatment of admission for heart failure and respiratory distress  06/15/2024 Patient reports that she is breathing better. Reports no swelling today.  Reports that she is tired from having PT today. 06/23/2024 Patient reports that she is doing well. Husband states that patient has slight swelling in ankles and feet.  Patient had not weighed today. Requested patient weigh during this call.  Weight per husband of 202 pounds Knowledge Deficit Related to management olf heart  failure, Medication management barrier  missing medications , and No Hospital Follow Up Provider appointment no PCP hospital follow up  06/15/2024  Patient reports that she has her medications and is taking as prescribed, Reports that her Continous blood glucose monitor is working well. Reports that she did not go to PCP or CHF clinic appointment last week.06/23/2024   Husband and patient report that patient is taking all her medications as prescribed. When I reviewed CBG of 311, then patient reports that she did not take her insulin .  Husband states that he watches patient take her medication but did not watch this am.  E No weights  06/15/2024 no weight today but reports yesterday weight of 198 pounds. 06/23/2024  202 pounds mid day weight during this call,  BP machine needs  a battery.  Continuous glucose monitoring not working.  06/15/2024  reports CBG working now and todays reading of 150. 06/23/2024  CBG reading during call of 311.  Did not take insulin  today.   Goal:  Over the next 30 days, the patient will not experience hospital readmission  Interventions:  Transitions of Care: Doctor Visits  - discussed the importance of doctor visits Confirmed active with PT. PT yesterday Encouraged patient and husband to meet with pharmacist to review medications. Reminded patient and husband of appointment in 2 days with pharmacist.  Reviewed all pending MD appointments  Encouraged patient to do home exercises as per PT instructions.  Assessed pain and swelling Encouraged husband to get patient batteries for BP cuff today. Reviewed importance of taking all medications as prescribed. Including fluid pills and insulin  Encouraged patient to drink extra water  today to help decrease her CBG reading Reviewed and encouraged husband to monitor CBG again in 2 hours after taking insulin . He agreed   Heart Failure Interventions: Basic overview and discussion of pathophysiology of Heart Failure  reviewed Provided education on low sodium diet Reviewed Heart Failure Action Plan in depth and provided verbal guidelines and referred patient and husband to reviewed AVS with written guidelines of when to call MD for weight gain.  Assessed need for readable accurate scales in home Provided education about placing scale on hard, flat surface Advised patient to weigh each morning after emptying bladder Discussed importance of daily weight and advised patient to weigh and record daily Reviewed role of diuretics in prevention of fluid overload and management of heart failure; Discussed the importance of keeping all appointments with providers Long discussion with husband about heart failure management, DM management and medications.  Reviewed importance of daily CHF management to avoid readmissions.   Patient Self Care Activities: Weigh daily and record weights Monitor CBG 3 times a day Take medications as prescribed Call MD office for changes in condition Review discharge instructions again on your own. Use your inhaler as prescribed.  Take you insulin  Follow low salt diet. Drink extra water  today to decrease CBG Recheck CBG 2 hours after taking insulin . If CBG continues to increase. Call MD  Plan:  Telephone follow up appointment with care management team member scheduled for:  06/30/2024 at 1pm The patient has been provided with contact information for the care management team and has been advised to call with any health related questions or concerns.          Recommendation:   Continue Current Plan of Care  Follow Up Plan:   Telephone follow up appointment date/time:  06/30/2024 at 1pm   Alan Ee, RN, BSN, CEN Population Health- Transition of Care Team.  [facility] [phone]

## 2024-06-23 NOTE — Patient Instructions (Signed)
 Visit Information  Thank you for taking time to visit with me today. Please don't hesitate to contact me if I can be of assistance to you before our next scheduled telephone appointment.  Our next appointment is by telephone on 06/30/2024 at 1pm  Following is a copy of your care plan:   Goals Addressed             This Visit's Progress    [facility]BCI Transitions of Care (TOC) Care Plan       Problems:  Recent Hospitalization for treatment of admission for heart failure and respiratory distress  06/15/2024 Patient reports that she is breathing better. Reports no swelling today.  Reports that she is tired from having PT today. 06/23/2024 Patient reports that she is doing well. Husband states that patient has slight swelling in ankles and feet.  Patient had not weighed today. Requested patient weigh during this call.  Weight per husband of 202 pounds Knowledge Deficit Related to management olf heart failure, Medication management barrier  missing medications , and No Hospital Follow Up Provider appointment no PCP hospital follow up  06/15/2024  Patient reports that she has her medications and is taking as prescribed, Reports that her Continous blood glucose monitor is working well. Reports that she did not go to PCP or CHF clinic appointment last week.06/23/2024   Husband and patient report that patient is taking all her medications as prescribed. When I reviewed CBG of 311, then patient reports that she did not take her insulin .  Husband states that he watches patient take her medication but did not watch this am.  E No weights  06/15/2024 no weight today but reports yesterday weight of 198 pounds. 06/23/2024  202 pounds mid day weight during this call,  BP machine needs  a battery.  Continuous glucose monitoring not working.  06/15/2024  reports CBG working now and todays reading of 150. 06/23/2024  CBG reading during call of 311.  Did not take insulin  today.   Goal:  Over the next 30 days, the  patient will not experience hospital readmission  Interventions:  Transitions of Care: Doctor Visits  - discussed the importance of doctor visits Confirmed active with PT. PT yesterday Encouraged patient and husband to meet with pharmacist to review medications. Reminded patient and husband of appointment in 2 days with pharmacist.  Reviewed all pending MD appointments  Encouraged patient to do home exercises as per PT instructions.  Assessed pain and swelling Encouraged husband to get patient batteries for BP cuff today. Reviewed importance of taking all medications as prescribed. Including fluid pills and insulin  Encouraged patient to drink extra water  today to help decrease her CBG reading Reviewed and encouraged husband to monitor CBG again in 2 hours after taking insulin . He agreed   Heart Failure Interventions: Basic overview and discussion of pathophysiology of Heart Failure reviewed Provided education on low sodium diet Reviewed Heart Failure Action Plan in depth and provided verbal guidelines and referred patient and husband to reviewed AVS with written guidelines of when to call MD for weight gain.  Assessed need for readable accurate scales in home Provided education about placing scale on hard, flat surface Advised patient to weigh each morning after emptying bladder Discussed importance of daily weight and advised patient to weigh and record daily Reviewed role of diuretics in prevention of fluid overload and management of heart failure; Discussed the importance of keeping all appointments with providers Long discussion with husband about heart failure management, DM management  and medications.  Reviewed importance of daily CHF management to avoid readmissions.   Patient Self Care Activities: Weigh daily and record weights Monitor CBG 3 times a day Take medications as prescribed Call MD office for changes in condition Review discharge instructions again on your  own. Use your inhaler as prescribed.  Take you insulin  Follow low salt diet. Drink extra water  today to decrease CBG Recheck CBG 2 hours after taking insulin . If CBG continues to increase. Call MD  Plan:  Telephone follow up appointment with care management team member scheduled for:  06/30/2024 at 1pm The patient has been provided with contact information for the care management team and has been advised to call with any health related questions or concerns.         Patient verbalizes understanding of instructions and care plan provided today and agrees to view in MyChart. Active MyChart status and patient understanding of how to access instructions and care plan via MyChart confirmed with patient.     Telephone follow up appointment with care management team member scheduled for:  Please call the care guide team at [phone] if you need to cancel or reschedule your appointment.   Please call the Suicide and Crisis Lifeline: 988 call the [place]  National Suicide Prevention Lifeline: 403 450 6521 or TTY: (510)595-4197 TTY (458) 457-7225) to talk to a trained counselor call 1-800-273-TALK (toll free, 24 hour hotline) call 911 if you are experiencing a Mental Health or Behavioral Health Crisis or need someone to talk to.  Alan Ee, RN, BSN, CEN [employer]- Transition of Care Team.  [facility] [phone]

## 2024-06-24 ENCOUNTER — Ambulatory Visit: Payer: Self-pay | Admitting: Family Medicine

## 2024-06-24 ENCOUNTER — Other Ambulatory Visit (HOSPITAL_COMMUNITY): Payer: Self-pay

## 2024-06-24 NOTE — Progress Notes (Signed)
 [name]                                          MRN: [id number]   06/24/2024   The [facility] Quality Team Specialist reviewed this patient medical record for the purposes of chart review for care gap closure. The following were reviewed: abstraction for care gap closure-kidney health evaluation for diabetes:eGFR  and uACR.    [facility] Quality Team

## 2024-06-25 ENCOUNTER — Other Ambulatory Visit: Payer: Self-pay | Admitting: Pharmacist

## 2024-06-25 ENCOUNTER — Telehealth: Payer: Self-pay | Admitting: Pharmacist

## 2024-06-25 NOTE — Telephone Encounter (Signed)
 Attempt was made to contact patient by phone today for follow up by Clinical Pharmacist regarding Libre 3 sensors and reader. I visited patient about 2 weeks ago to help her restart her Libre Continuous Glucose Monitor system. Wanted to follow up today to check blood glucose and make sure she has not issues with the reader/ sensor.  Unable to reach patient. Called her preferred phone number twice and it seemed like call was answered but I could not hear anyone on the other end.  I called her home phone once - no answer and unable to LM because mailbox was full. Will try again in 1 week.

## 2024-06-27 LAB — IRON,TIBC AND FERRITIN PANEL
%SAT: 44 % (ref 16–45)
Ferritin: 44 ng/mL (ref 16–288)
Iron: 166 ug/dL — ABNORMAL HIGH (ref 45–160)
TIBC: 379 ug/dL (ref 250–450)

## 2024-06-27 LAB — FRUCTOSAMINE: Fructosamine: 438 umol/L — ABNORMAL HIGH (ref 205–285)

## 2024-06-28 ENCOUNTER — Ambulatory Visit (HOSPITAL_BASED_OUTPATIENT_CLINIC_OR_DEPARTMENT_OTHER): Payer: Medicare PPO | Admitting: Obstetrics & Gynecology

## 2024-06-28 ENCOUNTER — Telehealth: Payer: Self-pay

## 2024-06-28 DIAGNOSIS — M47814 Spondylosis without myelopathy or radiculopathy, thoracic region: Secondary | ICD-10-CM | POA: Diagnosis not present

## 2024-06-28 NOTE — Telephone Encounter (Signed)
 Signed and put in box to go up front. Signed:  Gerlene Hockey, MD           06/28/2024

## 2024-06-28 NOTE — Telephone Encounter (Signed)
 Home health orders received 06/28/24 for [name] #[id number] Home health initiation orders: Yes.  Home health re-certification orders: No. Patient last seen by ordering physician for this condition: 06/22/24. Must be less than 90 days for re-certification and less than 30 days prior for initiation. Visit must have been for the condition the orders are being placed.  Patient meets criteria for Physician to sign orders: Yes.        Current med list has been attached: Yes        Orders placed on physicians desk for signature: 06/28/24 (date) If patient does not meet criteria for orders to be signed: pt was called to schedule appt. Appt is scheduled for n/a.   Shanda LELON Sharps

## 2024-06-28 NOTE — Telephone Encounter (Signed)
 I have reached out to [facility] specialty pharmacist James Teldrin, [facility] to see if he is handling the maintenance dosing for the patient [name] . Awaiting to hear from him regarding this patient.

## 2024-06-28 NOTE — Telephone Encounter (Signed)
 My message to Lynwood at [facility], So, on this patient, I still need to send to a specialty pharmacy and do the PA on the maintenance? I just want to check as I see the script were on hold for both of these patient's to be filled at [facility].   Still awaiting response from Millicent Blanch at [facility] specialty pharmacy.

## 2024-06-28 NOTE — Telephone Encounter (Signed)
 Per  Bella JONETTA Agent, RPH to my knowledge. You guys are to do the authorization for the maintenance medications but I'll add Yatin to ensure that I am telling you appropriately.

## 2024-06-29 NOTE — Telephone Encounter (Signed)
 Per Lynwood with [facility]: mrn [id number] had a final infusion reschedule. was due on 9-10 but had her last infusion on 9-22. first maint dose is due 10.20 (4 weeks after final infusion) and is due for onboarding call around 10.6. no pa required, co-pay at $100

## 2024-06-30 ENCOUNTER — Other Ambulatory Visit: Payer: Self-pay

## 2024-06-30 ENCOUNTER — Telehealth: Payer: Self-pay

## 2024-06-30 DIAGNOSIS — I700 Atherosclerosis of aorta: Secondary | ICD-10-CM | POA: Diagnosis not present

## 2024-06-30 DIAGNOSIS — I110 Hypertensive heart disease with heart failure: Secondary | ICD-10-CM | POA: Diagnosis not present

## 2024-06-30 DIAGNOSIS — M160 Bilateral primary osteoarthritis of hip: Secondary | ICD-10-CM | POA: Diagnosis not present

## 2024-06-30 DIAGNOSIS — I480 Paroxysmal atrial fibrillation: Secondary | ICD-10-CM | POA: Diagnosis not present

## 2024-06-30 DIAGNOSIS — I5033 Acute on chronic diastolic (congestive) heart failure: Secondary | ICD-10-CM | POA: Diagnosis not present

## 2024-06-30 DIAGNOSIS — J9601 Acute respiratory failure with hypoxia: Secondary | ICD-10-CM | POA: Diagnosis not present

## 2024-06-30 DIAGNOSIS — E1142 Type 2 diabetes mellitus with diabetic polyneuropathy: Secondary | ICD-10-CM | POA: Diagnosis not present

## 2024-06-30 DIAGNOSIS — J9811 Atelectasis: Secondary | ICD-10-CM | POA: Diagnosis not present

## 2024-07-01 ENCOUNTER — Other Ambulatory Visit (HOSPITAL_COMMUNITY): Payer: Self-pay

## 2024-07-01 ENCOUNTER — Ambulatory Visit (INDEPENDENT_AMBULATORY_CARE_PROVIDER_SITE_OTHER): Admitting: Gastroenterology

## 2024-07-01 ENCOUNTER — Other Ambulatory Visit: Payer: Self-pay

## 2024-07-01 DIAGNOSIS — I469 Cardiac arrest, cause unspecified: Secondary | ICD-10-CM | POA: Diagnosis not present

## 2024-07-01 DIAGNOSIS — I4901 Ventricular fibrillation: Secondary | ICD-10-CM | POA: Diagnosis not present

## 2024-07-01 DIAGNOSIS — R404 Transient alteration of awareness: Secondary | ICD-10-CM | POA: Diagnosis not present

## 2024-07-01 NOTE — Telephone Encounter (Signed)
Sympathy card mailed to the patient's family.

## 2024-07-01 NOTE — Telephone Encounter (Signed)
 Thanks for the update, please extend my condolences to her family during these times.  Dr. McGowen, please see message below.

## 2024-07-01 NOTE — Telephone Encounter (Signed)
 Per Devere the receptionist here at the office the patient passed away on 07/17/24. Please cancel any scripts/ appointments that were pending for this patient. Thanks,

## 2024-07-02 ENCOUNTER — Other Ambulatory Visit: Admitting: Pharmacist

## 2024-07-06 ENCOUNTER — Encounter (HOSPITAL_COMMUNITY)

## 2024-07-07 ENCOUNTER — Telehealth

## 2024-07-15 ENCOUNTER — Telehealth: Payer: Self-pay

## 2024-07-15 NOTE — Telephone Encounter (Signed)
 Please advise? Not sure if the pharmacy will take it back?  Copied from CRM [id number]. Topic: Clinical - Medication Question >> Jul 15, 2024 10:41 AM Dedra B wrote: Reason for CRM: Pt husband, Presly Steinruck, wants to know what he should do with the pt's insulin . Pls call him.

## 2024-07-15 NOTE — Telephone Encounter (Signed)
 Pt's husband advised.

## 2024-07-15 NOTE — Telephone Encounter (Signed)
 Dispose at pharmacy

## 2024-07-23 ENCOUNTER — Ambulatory Visit: Admitting: Family Medicine

## 2024-07-29 ENCOUNTER — Ambulatory Visit (INDEPENDENT_AMBULATORY_CARE_PROVIDER_SITE_OTHER): Payer: Medicare PPO | Admitting: Gastroenterology

## 2024-07-31 NOTE — Patient Instructions (Signed)
 Visit Information  Thank you for taking time to visit with me today. Please don't hesitate to contact me if I can be of assistance to you before our next scheduled telephone appointment.  Our next appointment is by telephone on 07/07/2024 at 1pm  Following is a copy of your care plan:   Goals Addressed             This Visit's Progress    [facility] Transitions of Care (TOC) Care Plan       Problems:  Recent Hospitalization for treatment of admission for heart failure and respiratory distress  06/15/2024 Patient reports that she is breathing better. Reports no swelling today.  Reports that she is tired from having PT today. 06/23/2024 Patient reports that she is doing well. Husband states that patient has slight swelling in ankles and feet.  Patient had not weighed today. Requested patient weigh during this call.  Weight per husband of 202 pounds.  06/30/2024  Spoke with husband who reports that patient is doing well. Unknown if patient weighed today.   Knowledge Deficit Related to management olf heart failure, Medication management barrier  missing medications , and No Hospital Follow Up Provider appointment no PCP hospital follow up  06/15/2024  Patient reports that she has her medications and is taking as prescribed, Reports that her Continous blood glucose monitor is working well. Reports that she did not go to PCP or CHF clinic appointment last week.06/23/2024   Husband and patient report that patient is taking all her medications as prescribed. When I reviewed CBG of 311, then patient reports that she did not take her insulin .  Husband states that he watches patient take her medication but did not watch this am. 06/30/2024  Husband reports patient taking her medications as prescribed.  No weights  06/15/2024 no weight today but reports yesterday weight of 198 pounds. 06/23/2024  202 pounds mid day weight during this call,  BP machine needs  a battery.  Continuous glucose monitoring not working.   06/15/2024  reports CBG working now and todays reading of 150. 06/23/2024  CBG reading during call of 311.  Did not take insulin  today. 06/30/2024  Unable to assess. Husband does not know and patient is unavailable.   Goal:  Over the next 30 days, the patient will not experience hospital readmission  Interventions:  Transitions of Care: Doctor Visits  - discussed the importance of doctor visits Confirmed active with PT. PT today per husband report Reviewed all pending MD appointments  Reviewed importance of taking all medications as prescribed.     Heart Failure Interventions: Reviewed importance of daily CHF management to avoid readmissions.    Patient Self Care Activities: Weigh daily and record weights Monitor CBG 3 times a day Take medications as prescribed Call MD office for changes in condition Take you insulin    Plan:  Telephone follow up appointment with care management team member scheduled for:  07/07/2024 at 1pm The patient has been provided with contact information for the care management team and has been advised to call with any health related questions or concerns.         Patient verbalizes understanding of instructions and care plan provided today and agrees to view in MyChart. Active MyChart status and patient understanding of how to access instructions and care plan via MyChart confirmed with patient.     Telephone follow up appointment with care management team member scheduled for:  07/07/2024 at 1pm  Please call the care guide  team at [phone] if you need to cancel or reschedule your appointment.   Please call the Suicide and Crisis Lifeline: 988 call the [place]  National Suicide Prevention Lifeline: (619)179-8392 or TTY: 409 383 7437 TTY 249-415-5458) to talk to a trained counselor call 1-800-273-TALK (toll free, 24 hour hotline) call 911 if you are experiencing a Mental Health or Behavioral Health Crisis or need someone to talk to.  Alan Ee,  RN, BSN, CEN [employer]- Transition of Care Team.  [facility] [phone]

## 2024-07-31 NOTE — Transitions of Care (Post Inpatient/ED Visit) (Signed)
 Transition of Care week 4  Visit Note  06/30/2024  Name: [name] MRN: [id number]          DOB: [birth year]  Situation: Patient enrolled in [facility] 30-day program. Visit completed with husband ( patient not with husband- called the home number per husbands requested, no answer and VM full) by telephone.   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 ? to get liver bx if she gets bariatric surgery? Mild portal hypertensive gastropathy on EGD 08/2019.   Fibromyalgia    GAD (generalized anxiety disorder)    GERD    History of hiatal hernia    History of iron  deficiency anemia 12/2018   Inadequate absorption secondary to chronic/long term PPI therapy + portal hypertensive gastroduodenopathy. No GIB found on EGD, colonosc, and givens. Iron  infusions X multiple.   History of thrombocytopenia 12/2011   Hyperlipidemia    Intolerant of statins   HYPERTENSION    IBS (irritable bowel syndrome)    -D.  Good response to bentyl  and imodium  as of 06/2018 GI f/u.   IDDM (insulin  dependent diabetes mellitus)    with DPN (managed by Dr. Trixie but then in 2018 pt preferred to have me manage for her convenience)   Limited mobility    Requires a walker for arthritic pain, widespread musculoskeletal pain, and neuropathic pain.   Morbid obesity (HCC)    As of 11/2018, pt considering sleeve gastectomy vs bipass as of eval by Dr. Gael considering as of 12/2019.   Nonalcoholic steatohepatitis    Viral Hep screens NEG.  CT 2015.  Transaminasemia.  U/S 07/2018 showed early changes of cirrhosis.   OSA (obstructive sleep apnea) 09/14/2015   sleep study 09/07/15: severe obstructive sleep apnea with an AHI of 72 and SaO2 low of  75%.>referred to sleep MD   Osteoarthritis    hips, shoulders, knees   PAF (paroxysmal atrial fibrillation) (HCC)    One documented episode (after getting EGD 2016).  Was on amiodarone  x 3 mo.  Rate control with metoprolol  + anticoag with xarelto . Watchman 12/2022, off anticoag   Portal hypertensive gastropathy (HCC)    hemorrhagic gastropathy + non-bleeding gastric ulcers on EGD 08/2023   Recurrent epistaxis    Granuloma in L nare cauterized by ENT 04/2020. Another cautery 06/2020   Small fiber neuropathy    Due to DM.  Symmetric hands and feet tingling/numbness.   Ulcerative colitis (HCC)    Remicade  infusion Q 8 weeks: in clinical and endoscopic remission as of 12/2018 GI f/u.  06/11/19 rpt colonoscopy->cecal and ascending colon colitis.    Assessment: Brief call with husband today. He states patient is at home and to call the home number. I called the home number with no answer and VM is full.  Husband reports that patient is doing well and had PT today. Husband unable to advise me of weights or CBG today.  Patient Reported Symptoms: Cognitive Cognitive Status: Unable to Assess      Neurological Neurological Review of Symptoms: Not assessed    HEENT HEENT Symptoms Reported: Not assessed      Cardiovascular Cardiovascular Symptoms Reported: Other: (husband unable to advise)    Respiratory Respiratory Symptoms Reported: Not assesed  Endocrine Endocrine Symptoms Reported: No symptoms reported Is patient diabetic?: Yes Is patient checking blood sugars at home?: Yes List most recent blood sugar readings, include date and time of day: husband unable to advise    Gastrointestinal Gastrointestinal Symptoms Reported: Not assessed      Genitourinary Genitourinary Symptoms Reported: Not assessed    Integumentary Integumentary Symptoms Reported: Not assessed    Musculoskeletal Musculoskelatal Symptoms Reviewed: Other Other Musculoskeletal Symptoms: husband reports patient had PT  today        Psychosocial Psychosocial Symptoms Reported: Not assessed         There were no vitals filed for this visit.  Medications Reviewed Today     Reviewed by Rumalda Alan PENNER, RN (Registered Nurse) on 06/30/24 at 1316  Med List Status: <None>   Medication Order Taking? Sig Documenting Provider Last Dose Status Informant  Accu-Chek Softclix Lancets lancets [id number] Yes USE TO CHECK BLOOD GLUCOSE UP TO 6 TIMES DAILY AS DIRECTED McGowen, Aleene DEL, MD  Active Spouse/Significant Other, Pharmacy Records  acetaminophen  (TYLENOL ) tablet 650 mg [id number]   Castaneda Mayorga, Daniel, MD  Active   albuterol  (VENTOLIN  HFA) 108 [phone] Base) MCG/ACT inhaler [id number] Yes Inhale 2 puffs into the lungs every 6 (six) hours as needed for wheezing or shortness of breath. McGowen, Philip H, MD  Active Spouse/Significant Other, Pharmacy Records  aspirin  EC 81 MG tablet [id number] Yes Take 1 tablet (81 mg total) by mouth daily. Swallow whole. Arvil Kate BIRCH, NP  Active Spouse/Significant Other, Pharmacy Records  blood glucose meter kit and supplies KIT [id number] Yes Use up to six times daily as directed. DX. E11.9 McGowen, Aleene DEL, MD  Active Spouse/Significant Other, Pharmacy Records    Discontinued 12/05/11 1452 (Completed Course)   citalopram  (CELEXA ) 20 MG tablet [id number] Yes Take 1 tablet (20 mg total) by mouth daily. McGowen, Aleene DEL, MD  Active Spouse/Significant Other, Pharmacy Records  Continuous Glucose Receiver (FREESTYLE LIBRE 3 READER) DEVI [id number] Yes Use as directed to check blood sugars Lowella, Benton CROME, PA  Active Spouse/Significant Other, Pharmacy Records  Continuous Glucose Sensor (FREESTYLE LIBRE 3 PLUS SENSOR) MISC [id number] Yes Change sensor every 15 days. McGowen, Philip H, MD  Active Spouse/Significant Other, Pharmacy Records  dapagliflozin  propanediol (FARXIGA ) 5 MG TABS tablet [id number] Yes Take 1 tablet (5 mg total) by mouth daily. Crain, Whitney L, PA  Active  Spouse/Significant Other, Pharmacy Records  diphenhydrAMINE  (BENADRYL ) capsule 25 mg [id number]   Castaneda Mayorga, Toribio, MD  Active   Dulaglutide  (TRULICITY ) 1.5 MG/0.5ML EMMANUEL [id number] Yes INJECT 1.5 MG (0.5ML) UNDER THE SKIN ONCE A WEEK McGowen, Aleene DEL, MD  Active Spouse/Significant Other, Pharmacy Records  esomeprazole  (NEXIUM ) 40 MG capsule [id number] Yes Take 1 capsule (40 mg total) by mouth daily before breakfast. Eartha Flavors, Toribio, MD  Active Spouse/Significant Other, Pharmacy Records  gabapentin  (NEURONTIN ) 100 MG capsule [id number] Yes Take 5 capsules (500 mg total) by mouth daily at 6 (six) AM. Shahmehdi, Seyed A, MD  Active   glucose blood (ACCU-CHEK GUIDE TEST) test strip [id number] Yes USE TO CHECK BLOOD GLUCOSE UP TO 6 TIMES DAILY. McGowen, Philip H, MD  Active Spouse/Significant Other, Pharmacy Records  Guselkumab  (TREMFYA  PEN) 200 MG/2ML EMMANUEL [id number] Yes Inject 200 mg into the skin every 28 (twenty-eight) days. Starting 4 weeks after the last Tremfya  infusion which will be around June 10, 2024 Castaneda Mayorga, Toribio, MD  Active Spouse/Significant Other, Pharmacy Records  insulin  lispro (HUMALOG  [facility]) 100 UNIT/ML KwikPen [id number] Yes 130 units with  breakfast, 115 units with lunch, and 60 U at supper McGowen, Aleene DEL, MD  Active   Insulin  Pen Needle (B-D UF III MINI PEN NEEDLES) 31G X 5 MM MISC [id number] Yes USE TO INJECT INSULINS EQUAL TO 6 TIMES DAILY. McGowen, Aleene DEL, MD  Active Spouse/Significant Other, Pharmacy Records  lactase (LACTAID) 3000 UNITS tablet [id number] Yes Take 3,000 Units by mouth as needed (when eating foods containing dairy).  [provider]  Active Spouse/Significant Other, Pharmacy Records  metolazone  (ZAROXOLYN ) 2.5 MG tablet [id number] Yes Take 1 tablet (2.5 mg total) by mouth daily. Willette Adriana LABOR, MD  Active   metoprolol  succinate (TOPROL -XL) 100 MG 24 hr tablet [id number] Yes TAKE 1.5 (one and one half tablet) TABLET BY MOUTH  2 TIMES DAILY. TAKE WITH OR IMMEDIATELY FOLLOWING A MEAL  Patient taking differently: Take 100 mg by mouth in the morning and at bedtime. TAKE 1.5 (one and one half tablet) TABLET BY MOUTH 2 TIMES DAILY. TAKE WITH OR IMMEDIATELY FOLLOWING A MEAL   Cleaver, Josefa HERO, NP  Active Spouse/Significant Other, Pharmacy Records  mirabegron  ER (MYRBETRIQ ) 25 MG TB24 tablet [id number] Yes Take 1 tablet (25 mg total) by mouth daily. McGowen, Aleene DEL, MD  Active Spouse/Significant Other, Pharmacy Records  potassium chloride  SA (KLOR-CON  M) 20 MEQ tablet [id number] Yes Take 1 tablet (20 mEq total) by mouth daily. Willette Adriana LABOR, MD  Active   spironolactone  (ALDACTONE ) 25 MG tablet [id number] Yes Take 0.5 tablets (12.5 mg total) by mouth daily. Willette Adriana LABOR, MD  Active   torsemide  (DEMADEX ) 20 MG tablet [id number] Yes Take 2 tablets (40 mg total) by mouth daily. Willette Adriana LABOR, MD  Active             Goals      Patient Stated     Maintain current health     Patient Stated     None at this time      Patient Stated     Maintain current health     [facility] Transitions of Care [facility]) Care Plan     Problems:  Recent Hospitalization for treatment of admission for heart failure and respiratory distress  06/15/2024 Patient reports that she is breathing better. Reports no swelling today.  Reports that she is tired from having PT today. 06/23/2024 Patient reports that she is doing well. Husband states that patient has slight swelling in ankles and feet.  Patient had not weighed today. Requested patient weigh during this call.  Weight per husband of 202 pounds.  06/30/2024  Spoke with husband who reports that patient is doing well. Unknown if patient weighed today.   Knowledge Deficit Related to management olf heart failure, Medication management barrier  missing medications , and No Hospital Follow Up Provider appointment no PCP hospital follow up  06/15/2024  Patient reports that she has her medications  and is taking as prescribed, Reports that her Continous blood glucose monitor is working well. Reports that she did not go to PCP or CHF clinic appointment last week.06/23/2024   Husband and patient report that patient is taking all her medications as prescribed. When I reviewed CBG of 311, then patient reports that she did not take her insulin .  Husband states that he watches patient take her medication but did not watch this am. 06/30/2024  Husband reports patient taking her medications as prescribed.  No weights  06/15/2024 no weight today but reports yesterday weight of 198 pounds. 06/23/2024  202 pounds mid day weight  during this call,  BP machine needs  a battery.  Continuous glucose monitoring not working.  06/15/2024  reports CBG working now and todays reading of 150. 06/23/2024  CBG reading during call of 311.  Did not take insulin  today. 06/30/2024  Unable to assess. Husband does not know and patient is unavailable.   Goal:  Over the next 30 days, the patient will not experience hospital readmission  Interventions:  Transitions of Care: Doctor Visits  - discussed the importance of doctor visits Confirmed active with PT. PT today per husband report Reviewed all pending MD appointments  Reviewed importance of taking all medications as prescribed.     Heart Failure Interventions: Reviewed importance of daily CHF management to avoid readmissions.    Patient Self Care Activities: Weigh daily and record weights Monitor CBG 3 times a day Take medications as prescribed Call MD office for changes in condition Take you insulin    Plan:  Telephone follow up appointment with care management team member scheduled for:  07/07/2024 at 1pm The patient has been provided with contact information for the care management team and has been advised to call with any health related questions or concerns.         Recommendation:   Continue Current Plan of Care  Follow Up Plan:   Telephone  follow up appointment date/time:  07/07/2024   Alan Ee, RN, BSN, CEN [employer]- Transition of Care Team.  [facility] [phone]

## 2024-07-31 NOTE — Telephone Encounter (Signed)
 PT and Home health aide Home health orders received 06/30/24 for [facility] health initiation orders: Yes.  Home health re-certification orders: No. Patient last seen by ordering physician for this condition: 06/22/24. Must be less than 90 days for re-certification and less than 30 days prior for initiation. Visit must have been for the condition the orders are being placed.  Patient meets criteria for Physician to sign orders: Yes.        Current med list has been attached: No        Orders placed on physicians desk for signature: 06/30/24 (date) If patient does not meet criteria for orders to be signed: pt was called to schedule appt. Appt is scheduled for 07/23/24.   Placed on PCP desk to review and sign, if appropriate.   Sona Nations D Asheton Scheffler

## 2024-07-31 NOTE — Telephone Encounter (Signed)
 Signed and put in box to go up front. Signed:  Gerlene Hockey, MD           06/30/2024

## 2024-07-31 NOTE — Telephone Encounter (Signed)
 [facility] faxed 1 form for review and signature from provider.  Order # [id number]   McGowen inbox front office

## 2024-07-31 DEATH — deceased

## 2024-08-04 ENCOUNTER — Ambulatory Visit: Admitting: Family Medicine

## 2024-09-15 ENCOUNTER — Ambulatory Visit (HOSPITAL_BASED_OUTPATIENT_CLINIC_OR_DEPARTMENT_OTHER): Admitting: Obstetrics & Gynecology

## 2025-01-26 ENCOUNTER — Encounter
